# Patient Record
Sex: Female | Born: 1950 | Race: White | Hispanic: No | Marital: Married | Smoking: Never smoker
Health system: Southern US, Community
[De-identification: ages and names within clinical notes are randomized; demographics above are authoritative.]

## PROBLEM LIST (undated history)

## (undated) DIAGNOSIS — I4891 Unspecified atrial fibrillation: Secondary | ICD-10-CM

## (undated) DIAGNOSIS — M810 Age-related osteoporosis without current pathological fracture: Secondary | ICD-10-CM

## (undated) DIAGNOSIS — K222 Esophageal obstruction: Secondary | ICD-10-CM

## (undated) DIAGNOSIS — E119 Type 2 diabetes mellitus without complications: Secondary | ICD-10-CM

## (undated) DIAGNOSIS — G4730 Sleep apnea, unspecified: Secondary | ICD-10-CM

## (undated) DIAGNOSIS — Z952 Presence of prosthetic heart valve: Secondary | ICD-10-CM

## (undated) DIAGNOSIS — N183 Chronic kidney disease, stage 3 unspecified: Secondary | ICD-10-CM

## (undated) DIAGNOSIS — I313 Pericardial effusion (noninflammatory): Secondary | ICD-10-CM

## (undated) DIAGNOSIS — I350 Nonrheumatic aortic (valve) stenosis: Secondary | ICD-10-CM

## (undated) DIAGNOSIS — Z87442 Personal history of urinary calculi: Secondary | ICD-10-CM

## (undated) DIAGNOSIS — K219 Gastro-esophageal reflux disease without esophagitis: Secondary | ICD-10-CM

## (undated) DIAGNOSIS — I509 Heart failure, unspecified: Secondary | ICD-10-CM

## (undated) DIAGNOSIS — I10 Essential (primary) hypertension: Secondary | ICD-10-CM

## (undated) DIAGNOSIS — E785 Hyperlipidemia, unspecified: Secondary | ICD-10-CM

## (undated) DIAGNOSIS — L8990 Pressure ulcer of unspecified site, unspecified stage: Secondary | ICD-10-CM

## (undated) DIAGNOSIS — K449 Diaphragmatic hernia without obstruction or gangrene: Secondary | ICD-10-CM

## (undated) DIAGNOSIS — D490 Neoplasm of unspecified behavior of digestive system: Secondary | ICD-10-CM

## (undated) DIAGNOSIS — G459 Transient cerebral ischemic attack, unspecified: Secondary | ICD-10-CM

## (undated) DIAGNOSIS — K589 Irritable bowel syndrome without diarrhea: Secondary | ICD-10-CM

## (undated) DIAGNOSIS — T884XXA Failed or difficult intubation, initial encounter: Secondary | ICD-10-CM

## (undated) DIAGNOSIS — IMO0002 Reserved for concepts with insufficient information to code with codable children: Secondary | ICD-10-CM

## (undated) DIAGNOSIS — R413 Other amnesia: Secondary | ICD-10-CM

## (undated) DIAGNOSIS — I3139 Other pericardial effusion (noninflammatory): Secondary | ICD-10-CM

## (undated) DIAGNOSIS — I2510 Atherosclerotic heart disease of native coronary artery without angina pectoris: Secondary | ICD-10-CM

## (undated) HISTORY — DX: Gastro-esophageal reflux disease without esophagitis: K21.9

## (undated) HISTORY — DX: Age-related osteoporosis without current pathological fracture: M81.0

## (undated) HISTORY — PX: ESOPHAGOGASTRODUODENOSCOPY (EGD) WITH ESOPHAGEAL DILATION: SHX5812

## (undated) HISTORY — DX: Essential (primary) hypertension: I10

## (undated) HISTORY — DX: Other amnesia: R41.3

## (undated) HISTORY — DX: Esophageal obstruction: K22.2

## (undated) HISTORY — DX: Reserved for concepts with insufficient information to code with codable children: IMO0002

## (undated) HISTORY — DX: Pericardial effusion (noninflammatory): I31.3

## (undated) HISTORY — DX: Heart failure, unspecified: I50.9

## (undated) HISTORY — DX: Other pericardial effusion (noninflammatory): I31.39

## (undated) HISTORY — DX: Transient cerebral ischemic attack, unspecified: G45.9

## (undated) HISTORY — DX: Irritable bowel syndrome, unspecified: K58.9

## (undated) HISTORY — DX: Hyperlipidemia, unspecified: E78.5

## (undated) HISTORY — DX: Neoplasm of unspecified behavior of digestive system: D49.0

## (undated) HISTORY — DX: Sleep apnea, unspecified: G47.30

## (undated) HISTORY — PX: TUBAL LIGATION: SHX77

## (undated) HISTORY — DX: Atherosclerotic heart disease of native coronary artery without angina pectoris: I25.10

## (undated) HISTORY — DX: Morbid (severe) obesity due to excess calories: E66.01

## (undated) HISTORY — DX: Diaphragmatic hernia without obstruction or gangrene: K44.9

---
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
## 1980-08-15 HISTORY — PX: CHOLECYSTECTOMY OPEN: SUR202

## 1998-04-21 ENCOUNTER — Other Ambulatory Visit: Admission: RE | Admit: 1998-04-21 | Discharge: 1998-04-21 | Payer: Self-pay | Admitting: Obstetrics and Gynecology

## 1999-05-05 ENCOUNTER — Other Ambulatory Visit: Admission: RE | Admit: 1999-05-05 | Discharge: 1999-05-05 | Payer: Self-pay | Admitting: Obstetrics and Gynecology

## 2000-06-21 ENCOUNTER — Other Ambulatory Visit: Admission: RE | Admit: 2000-06-21 | Discharge: 2000-06-21 | Payer: Self-pay | Admitting: Obstetrics and Gynecology

## 2001-06-15 ENCOUNTER — Encounter: Payer: Self-pay | Admitting: Obstetrics and Gynecology

## 2001-06-15 ENCOUNTER — Encounter: Admission: RE | Admit: 2001-06-15 | Discharge: 2001-06-15 | Payer: Self-pay | Admitting: Obstetrics and Gynecology

## 2001-07-02 ENCOUNTER — Other Ambulatory Visit: Admission: RE | Admit: 2001-07-02 | Discharge: 2001-07-02 | Payer: Self-pay | Admitting: Obstetrics and Gynecology

## 2001-08-15 HISTORY — PX: CATARACT EXTRACTION W/ INTRAOCULAR LENS  IMPLANT, BILATERAL: SHX1307

## 2002-02-18 ENCOUNTER — Encounter: Payer: Self-pay | Admitting: Ophthalmology

## 2002-02-19 ENCOUNTER — Ambulatory Visit (HOSPITAL_COMMUNITY): Admission: RE | Admit: 2002-02-19 | Discharge: 2002-02-19 | Payer: Self-pay | Admitting: Ophthalmology

## 2002-06-17 ENCOUNTER — Encounter: Payer: Self-pay | Admitting: Obstetrics and Gynecology

## 2002-06-17 ENCOUNTER — Encounter: Admission: RE | Admit: 2002-06-17 | Discharge: 2002-06-17 | Payer: Self-pay | Admitting: Obstetrics and Gynecology

## 2002-07-09 ENCOUNTER — Other Ambulatory Visit: Admission: RE | Admit: 2002-07-09 | Discharge: 2002-07-09 | Payer: Self-pay | Admitting: Obstetrics & Gynecology

## 2002-09-03 ENCOUNTER — Ambulatory Visit (HOSPITAL_COMMUNITY): Admission: RE | Admit: 2002-09-03 | Discharge: 2002-09-03 | Payer: Self-pay | Admitting: Ophthalmology

## 2003-03-31 ENCOUNTER — Encounter: Admission: RE | Admit: 2003-03-31 | Discharge: 2003-06-29 | Payer: Self-pay | Admitting: Family Medicine

## 2003-07-04 ENCOUNTER — Encounter: Admission: RE | Admit: 2003-07-04 | Discharge: 2003-07-04 | Payer: Self-pay | Admitting: Obstetrics and Gynecology

## 2003-07-31 ENCOUNTER — Other Ambulatory Visit: Admission: RE | Admit: 2003-07-31 | Discharge: 2003-07-31 | Payer: Self-pay | Admitting: Obstetrics and Gynecology

## 2004-04-13 ENCOUNTER — Ambulatory Visit (HOSPITAL_COMMUNITY): Admission: RE | Admit: 2004-04-13 | Discharge: 2004-04-13 | Payer: Self-pay | Admitting: Gastroenterology

## 2004-05-15 LAB — HM COLONOSCOPY

## 2004-07-05 ENCOUNTER — Ambulatory Visit (HOSPITAL_COMMUNITY): Admission: RE | Admit: 2004-07-05 | Discharge: 2004-07-05 | Payer: Self-pay | Admitting: Obstetrics and Gynecology

## 2004-07-14 ENCOUNTER — Ambulatory Visit (HOSPITAL_COMMUNITY): Admission: RE | Admit: 2004-07-14 | Discharge: 2004-07-14 | Payer: Self-pay | Admitting: Family Medicine

## 2004-07-22 ENCOUNTER — Ambulatory Visit: Payer: Self-pay

## 2004-07-28 ENCOUNTER — Ambulatory Visit: Payer: Self-pay | Admitting: Cardiology

## 2004-08-04 ENCOUNTER — Other Ambulatory Visit: Admission: RE | Admit: 2004-08-04 | Discharge: 2004-08-04 | Payer: Self-pay | Admitting: Obstetrics and Gynecology

## 2004-08-15 HISTORY — PX: CARDIAC CATHETERIZATION: SHX172

## 2004-09-29 ENCOUNTER — Ambulatory Visit (HOSPITAL_COMMUNITY): Admission: RE | Admit: 2004-09-29 | Discharge: 2004-09-29 | Payer: Self-pay | Admitting: Family Medicine

## 2004-10-19 ENCOUNTER — Ambulatory Visit: Payer: Self-pay

## 2004-10-19 ENCOUNTER — Ambulatory Visit: Payer: Self-pay | Admitting: Cardiology

## 2004-10-22 ENCOUNTER — Ambulatory Visit: Payer: Self-pay | Admitting: Cardiology

## 2004-11-10 ENCOUNTER — Ambulatory Visit: Payer: Self-pay | Admitting: Cardiology

## 2004-11-23 ENCOUNTER — Ambulatory Visit: Payer: Self-pay

## 2004-12-02 ENCOUNTER — Ambulatory Visit: Payer: Self-pay | Admitting: Cardiology

## 2005-01-04 ENCOUNTER — Ambulatory Visit: Payer: Self-pay | Admitting: Cardiology

## 2005-01-06 ENCOUNTER — Inpatient Hospital Stay (HOSPITAL_BASED_OUTPATIENT_CLINIC_OR_DEPARTMENT_OTHER): Admission: RE | Admit: 2005-01-06 | Discharge: 2005-01-06 | Payer: Self-pay | Admitting: Cardiology

## 2005-01-13 ENCOUNTER — Ambulatory Visit: Payer: Self-pay | Admitting: Cardiology

## 2005-01-18 ENCOUNTER — Ambulatory Visit: Payer: Self-pay

## 2005-02-24 ENCOUNTER — Ambulatory Visit: Payer: Self-pay | Admitting: Cardiology

## 2005-04-26 ENCOUNTER — Ambulatory Visit: Payer: Self-pay

## 2005-06-02 ENCOUNTER — Ambulatory Visit: Payer: Self-pay | Admitting: Cardiology

## 2005-08-23 ENCOUNTER — Other Ambulatory Visit: Admission: RE | Admit: 2005-08-23 | Discharge: 2005-08-23 | Payer: Self-pay | Admitting: Obstetrics and Gynecology

## 2005-08-24 ENCOUNTER — Ambulatory Visit: Payer: Self-pay | Admitting: Cardiology

## 2005-10-21 ENCOUNTER — Ambulatory Visit: Payer: Self-pay

## 2005-10-21 ENCOUNTER — Encounter: Payer: Self-pay | Admitting: Internal Medicine

## 2006-11-13 ENCOUNTER — Ambulatory Visit: Payer: Self-pay

## 2007-06-18 ENCOUNTER — Ambulatory Visit: Payer: Self-pay | Admitting: Cardiology

## 2007-09-08 ENCOUNTER — Ambulatory Visit (HOSPITAL_COMMUNITY): Admission: RE | Admit: 2007-09-08 | Discharge: 2007-09-08 | Payer: Self-pay | Admitting: Physician Assistant

## 2007-10-24 ENCOUNTER — Ambulatory Visit: Payer: Self-pay

## 2007-10-24 ENCOUNTER — Encounter: Payer: Self-pay | Admitting: Cardiology

## 2007-12-26 ENCOUNTER — Ambulatory Visit: Payer: Self-pay | Admitting: Cardiology

## 2007-12-26 LAB — CONVERTED CEMR LAB
BUN: 23 mg/dL (ref 6–23)
CO2: 27 meq/L (ref 19–32)
Calcium: 8.7 mg/dL (ref 8.4–10.5)
Chloride: 109 meq/L (ref 96–112)
Creatinine, Ser: 1.1 mg/dL (ref 0.4–1.2)
GFR calc Af Amer: 66 mL/min
GFR calc non Af Amer: 55 mL/min
Glucose, Bld: 133 mg/dL — ABNORMAL HIGH (ref 70–99)
Potassium: 4.8 meq/L (ref 3.5–5.1)
Sodium: 142 meq/L (ref 135–145)

## 2008-12-24 ENCOUNTER — Encounter: Payer: Self-pay | Admitting: Cardiology

## 2008-12-24 ENCOUNTER — Ambulatory Visit: Payer: Self-pay

## 2008-12-25 ENCOUNTER — Encounter: Payer: Self-pay | Admitting: Cardiology

## 2008-12-26 DIAGNOSIS — E119 Type 2 diabetes mellitus without complications: Secondary | ICD-10-CM | POA: Insufficient documentation

## 2008-12-26 DIAGNOSIS — K219 Gastro-esophageal reflux disease without esophagitis: Secondary | ICD-10-CM | POA: Insufficient documentation

## 2008-12-26 DIAGNOSIS — M1990 Unspecified osteoarthritis, unspecified site: Secondary | ICD-10-CM | POA: Insufficient documentation

## 2008-12-26 DIAGNOSIS — I2510 Atherosclerotic heart disease of native coronary artery without angina pectoris: Secondary | ICD-10-CM | POA: Insufficient documentation

## 2008-12-26 DIAGNOSIS — I3139 Other pericardial effusion (noninflammatory): Secondary | ICD-10-CM | POA: Insufficient documentation

## 2008-12-26 DIAGNOSIS — E1169 Type 2 diabetes mellitus with other specified complication: Secondary | ICD-10-CM | POA: Insufficient documentation

## 2008-12-26 DIAGNOSIS — E785 Hyperlipidemia, unspecified: Secondary | ICD-10-CM | POA: Insufficient documentation

## 2008-12-26 DIAGNOSIS — I313 Pericardial effusion (noninflammatory): Secondary | ICD-10-CM | POA: Insufficient documentation

## 2008-12-26 DIAGNOSIS — I10 Essential (primary) hypertension: Secondary | ICD-10-CM | POA: Insufficient documentation

## 2008-12-30 ENCOUNTER — Ambulatory Visit: Payer: Self-pay | Admitting: Cardiology

## 2008-12-30 DIAGNOSIS — R600 Localized edema: Secondary | ICD-10-CM | POA: Insufficient documentation

## 2009-01-05 ENCOUNTER — Ambulatory Visit: Payer: Self-pay

## 2009-01-05 ENCOUNTER — Encounter: Payer: Self-pay | Admitting: Cardiology

## 2009-02-23 ENCOUNTER — Encounter: Payer: Self-pay | Admitting: Cardiology

## 2009-02-23 ENCOUNTER — Ambulatory Visit: Payer: Self-pay

## 2009-02-27 ENCOUNTER — Telehealth (INDEPENDENT_AMBULATORY_CARE_PROVIDER_SITE_OTHER): Payer: Self-pay | Admitting: *Deleted

## 2009-03-02 ENCOUNTER — Ambulatory Visit: Payer: Self-pay | Admitting: Vascular Surgery

## 2009-08-15 HISTORY — PX: CARPAL TUNNEL RELEASE: SHX101

## 2009-10-13 ENCOUNTER — Ambulatory Visit: Payer: Self-pay | Admitting: Vascular Surgery

## 2009-11-30 ENCOUNTER — Ambulatory Visit: Payer: Self-pay | Admitting: Vascular Surgery

## 2009-12-08 ENCOUNTER — Ambulatory Visit: Payer: Self-pay | Admitting: Vascular Surgery

## 2009-12-13 LAB — HM PAP SMEAR

## 2010-02-19 ENCOUNTER — Encounter: Payer: Self-pay | Admitting: Internal Medicine

## 2010-02-22 ENCOUNTER — Telehealth: Payer: Self-pay | Admitting: Cardiology

## 2010-02-22 ENCOUNTER — Ambulatory Visit: Payer: Self-pay | Admitting: Internal Medicine

## 2010-02-23 ENCOUNTER — Ambulatory Visit
Admission: RE | Admit: 2010-02-23 | Discharge: 2010-02-23 | Payer: Self-pay | Source: Home / Self Care | Admitting: Orthopedic Surgery

## 2010-04-11 ENCOUNTER — Observation Stay (HOSPITAL_COMMUNITY): Admission: EM | Admit: 2010-04-11 | Discharge: 2010-04-13 | Payer: Self-pay | Admitting: Emergency Medicine

## 2010-04-11 ENCOUNTER — Encounter (INDEPENDENT_AMBULATORY_CARE_PROVIDER_SITE_OTHER): Payer: Self-pay | Admitting: Internal Medicine

## 2010-04-11 ENCOUNTER — Ambulatory Visit: Payer: Self-pay | Admitting: Vascular Surgery

## 2010-07-28 ENCOUNTER — Ambulatory Visit: Payer: Self-pay | Admitting: Cardiology

## 2010-07-28 ENCOUNTER — Encounter: Payer: Self-pay | Admitting: Cardiology

## 2010-07-28 DIAGNOSIS — R9431 Abnormal electrocardiogram [ECG] [EKG]: Secondary | ICD-10-CM | POA: Insufficient documentation

## 2010-08-04 ENCOUNTER — Ambulatory Visit: Payer: Self-pay

## 2010-08-04 ENCOUNTER — Ambulatory Visit: Payer: Self-pay | Admitting: Cardiology

## 2010-08-04 DIAGNOSIS — R9439 Abnormal result of other cardiovascular function study: Secondary | ICD-10-CM | POA: Insufficient documentation

## 2010-08-10 ENCOUNTER — Telehealth (INDEPENDENT_AMBULATORY_CARE_PROVIDER_SITE_OTHER): Payer: Self-pay | Admitting: *Deleted

## 2010-08-11 ENCOUNTER — Encounter: Payer: Self-pay | Admitting: Cardiology

## 2010-08-11 ENCOUNTER — Ambulatory Visit: Payer: Self-pay

## 2010-08-11 ENCOUNTER — Encounter: Payer: Self-pay | Admitting: Cardiovascular Disease

## 2010-08-11 ENCOUNTER — Encounter (HOSPITAL_COMMUNITY)
Admission: RE | Admit: 2010-08-11 | Discharge: 2010-09-14 | Payer: Self-pay | Source: Home / Self Care | Attending: Cardiology | Admitting: Cardiology

## 2010-08-12 ENCOUNTER — Ambulatory Visit: Admission: RE | Admit: 2010-08-12 | Discharge: 2010-08-12 | Payer: Self-pay | Source: Home / Self Care

## 2010-08-13 ENCOUNTER — Encounter: Payer: Self-pay | Admitting: Cardiology

## 2010-08-18 ENCOUNTER — Ambulatory Visit
Admission: RE | Admit: 2010-08-18 | Discharge: 2010-08-18 | Payer: Self-pay | Source: Home / Self Care | Attending: Cardiology | Admitting: Cardiology

## 2010-08-18 DIAGNOSIS — R0602 Shortness of breath: Secondary | ICD-10-CM | POA: Insufficient documentation

## 2010-09-05 ENCOUNTER — Encounter: Payer: Self-pay | Admitting: Family Medicine

## 2010-09-14 NOTE — Assessment & Plan Note (Signed)
Summary: APPT @ 3:45/per check out/sf  Medications Added JANUMET 50-1000 MG TABS (SITAGLIPTIN-METFORMIN HCL) two times a day GLIPIZIDE 5 MG TABS (GLIPIZIDE) two times a day NOVOLOG MIX 70/30 70-30 % SUSP (INSULIN ASPART PROT & ASPART) two times a day METOPROLOL TARTRATE 50 MG TABS (METOPROLOL TARTRATE) two times a day QUINAPRIL HCL 40 MG TABS (QUINAPRIL HCL) daily DIOVAN HCT 320-12.5 MG TABS (VALSARTAN-HYDROCHLOROTHIAZIDE) daily NEXIUM 40 MG CPDR (ESOMEPRAZOLE MAGNESIUM) daily ASPIRIN 325 MG  TABS (ASPIRIN) daily      Allergies Added: ! PENICILLIN  Visit Type:  Follow-up Primary Provider:  Morrie Sheldon, MD  CC:  Pericardial Effusion.  History of Present Illness: The patient presents for followup of idiopathic pericardial effusion. She had a followup echo earlier this year which demonstrated no significant effusion. Since I last saw her she has had no new cardiovascular complaints. She denies any new shortness of breath, PND or orthopnea. She does have lower extremity swelling that is worse on the right than the left. This has been somewhat chronic but she has noticed that her right leg now has a more tense calf swelling than previous.  Allergies (verified): 1)  ! Penicillin  Past History:  Past Medical History:    Reviewed history from 12/26/2008 and no changes required:     1. Nonobstructive coronary artery disease.      2. Well-preserved ejection fraction.      3. Pericardial effusion of unclear etiology (small).      4. Dyslipidemia.      5. Diabetes mellitus.      6. Hypertension.      7. Gastroesophageal reflux disease.      8. Osteoarthritis.   Past Surgical History:    Reviewed history from 12/26/2008 and no changes required:    1. Cholecystectomy.    2. Tonsillectomy    3. Eye surgery  Review of Systems       She reports falling asleep easily but denies snoring or apneic episodes. All other review of systems negative.  Vital Signs:  Patient profile:   60 year  old female Height:      68 inches Weight:      240 pounds BMI:     36.62 Pulse rate:   96 / minute Resp:     18 per minute BP sitting:   155 / 86  (right arm)  Vitals Entered By: Levora Angel, CNA (Dec 30, 2008 3:51 PM)  Physical Exam  General:  Well developed, well nourished, in no acute distress. Head:  normocephalic and atraumatic Eyes:  PERRLA/EOM intact; conjunctiva and lids normal. Mouth:  Teeth, gums and palate normal. Oral mucosa normal. Neck:  Neck supple, no JVD. No masses, thyromegaly or abnormal cervical nodes. Chest Wall:  no deformities or breast masses noted Lungs:  Clear bilaterally to auscultation and percussion. Abdomen:  Bowel sounds positive; abdomen soft and non-tender without masses, organomegaly, or hernias noted. No hepatosplenomegaly, morbidly obese Msk:  Back normal, normal gait. Muscle strength and tone normal. Pulses:  pulses normal in all 4 extremities Neurologic:  Alert and oriented x 3. Skin:  Intact without lesions or rashes. Cervical Nodes:  no significant adenopathy Psych:  Normal affect.   Detailed Cardiovascular Exam  Neck    Carotids: Carotids full and equal bilaterally without bruits.      Neck Veins: Normal, no JVD, difficult exam secondary to the size of her neck.  Heart    Inspection: no deformities or lifts noted.  Palpation: normal PMI with no thrills palpable.      Auscultation: regular rate and rhythm, S1, S2 without murmurs, rubs, gallops, or clicks.    Vascular    Abdominal Aorta: no palpable masses, pulsations, or audible bruits.      Femoral Pulses: normal femoral pulses bilaterally.      Pedal Pulses: normal pedal pulses bilaterally.      Radial Pulses: normal radial pulses bilaterally.      Peripheral Circulation: right greater than left lower extremity edema with right chronic venous stasis changes.   EKG  Procedure date:  12/30/2008  Findings:      Normal sinus rhythm, rate 96, no acute ST-T wave changes.   Impression & Recommendations:  Problem # 1:  SWELLING OF LIMB (ICD-729.81) I will order a lower extremity venous Doppler to exclude DVT. Orders: Venous Duplex Lower Extremity (Venous Duplex Lower)  Problem # 2:  PERICARDIAL EFFUSION (ICD-423.9) The patient has no evidence of effusion. No further cardiovascular testing is suggested. Orders: EKG w/ Interpretation (93000)  Patient Instructions: 1)  Your physician recommends that you schedule a follow-up appointment in:  needed 2)  Your physician recommends that you continue on your current medications as directed. Please refer to the Current Medication list given to you today. 3)  Your physician has requested that you have a lower or upper extremity venous duplex.  This test is an ultrasound of the veins in the legs or arms.  It looks at venous blood flow that carries blood from the heart to the legs or arms.  Allow one hour for a Lower Venous exam.  Allow thirty minutes for an Upper Venous exam. There are no restrictions or special instructions.

## 2010-09-14 NOTE — Miscellaneous (Signed)
Summary: Orders Update  Clinical Lists Changes  Orders: Added new Test order of Venous Duplex Lower Extremity (Venous Duplex Lower) - Signed 

## 2010-09-14 NOTE — Progress Notes (Signed)
Summary: surgical clearance   Phone Note From Other Clinic   Caller: nurse lorraine Summary of Call: Per Edwena Felty. pt to have carpal tunnel surgery tomorrow morning. pt needs clearance. Ekg was faxed on Friday to [facility] & [facility]. Havn't heard anything about clearance as of yet. Please call and let nurse know if she is ok or if they need to rs. ofc [phone] or [phone] and pager Lorriane Rothrock.  Initial call taken by: Lorraine Lax,  February 22, 2010 9:29 AM  Follow-up for Phone Call        02/22/10--10am--[facility]--day surgery calling for surg clearance for carpal tunnel surgery for 02/23/10--advised pt has not seen dr Anja Neuzil for over 1 year and EKG( done at [facility]  shows PAC's,st abnormality,and junctional escape complexes--advised lorraine rothrock at day surgery that dr Caidynce Muzyka not in office today and probably would want to see pt before surgery--lorraine agrees and will notify pt and have pt call our office to make appoint.--surgery will be postponed until then--nt Follow-up by: Leodis Sias, RN,  February 22, 2010 10:10 AM     Appended Document: surgical clearance pt saw Dr Harrington Challenger in the office 02/22/2010 and surgical clearance was given, please see her note.

## 2010-09-14 NOTE — Assessment & Plan Note (Signed)
Summary: pt having surgery 7/13/lg  Medications Added METFORMIN HCL 1000 MG TABS (METFORMIN HCL) 1 tab  two times a day ONGLYZA 5 MG TABS (SAXAGLIPTIN HCL) 1 tab once daily      Allergies Added:   Visit Type:  Pre-op Evaluation Primary Provider:  Morrie Sheldon, MD  CC:  pt has a cough from a cold. Marland Kitchen  History of Present Illness: Patient is a 60 year old with a history of mild CAD by cath in 2006.  She was last seen by Vita Barley last year.   sHe also has a hitory of diabetes, obesity, dyslipidemia and hypertension. She is being evaluated for carpal tunnel surgery that will be done under regional anesthetic. She denies chest pains.  She is not that active.  She is up walking at work during the day but at a slower pace.  At home does not do much house work   Does not walk in an organized fashion after work. She said she will get short of breath if she pushes herself, but overall does not notice any problems with her breathing.  Current Medications (verified): 1)  Novolog Mix 70/30 70-30 % Susp (Insulin Aspart Prot & Aspart) .... Two Times A Day 2)  Metoprolol Tartrate 50 Mg Tabs (Metoprolol Tartrate) .... Two Times A Day 3)  Quinapril Hcl 40 Mg Tabs (Quinapril Hcl) .... Daily 4)  Diovan Hct 320-12.5 Mg Tabs (Valsartan-Hydrochlorothiazide) .... Daily 5)  Nexium 40 Mg Cpdr (Esomeprazole Magnesium) .... Daily 6)  Aspirin 325 Mg  Tabs (Aspirin) .... Daily 7)  Metformin Hcl 1000 Mg Tabs (Metformin Hcl) .Marland Kitchen.. 1 Tab  Two Times A Day 8)  Onglyza 5 Mg Tabs (Saxagliptin Hcl) .Marland Kitchen.. 1 Tab Once Daily  Allergies (verified): 1)  ! Penicillin  Past History:  Past medical, surgical, family and social histories (including risk factors) reviewed, and no changes noted (except as noted below).  Past Medical History: Reviewed history from 12/26/2008 and no changes required.  1. Nonobstructive coronary artery disease.   2. Well-preserved ejection fraction.   3. Pericardial effusion of unclear etiology  (small).   4. Dyslipidemia.   5. Diabetes mellitus.   6. Hypertension.   7. Gastroesophageal reflux disease.   8. Osteoarthritis.   Past Surgical History: Reviewed history from 12/26/2008 and no changes required. 1. Cholecystectomy. 2. Tonsillectomy 3. Eye surgery  Family History: Reviewed history from 12/26/2008 and no changes required. Mother: Deceased heart failure Father: bypass Family History of Diabetes:  Family History of Hypertension:   Social History: Reviewed history from 12/26/2008 and no changes required. Full Time Married  Tobacco Use - No.  Alcohol Use - no  Review of Systems       All systems reviewed.  Neg to the above problems.  Is recovering from a URI.  Vital Signs:  Patient profile:   60 year old female Height:      68 inches Weight:      237 pounds BMI:     36.17 Pulse rate:   98 / minute BP sitting:   142 / 81  (left arm) Cuff size:   large  Vitals Entered By: Lubertha Basque, CNA (February 22, 2010 3:37 PM)  Physical Exam  Additional Exam:  Obese 60 year old in NAD HEENT:  Normocephalic, atraumatic. EOMI, PERRLA.  Neck: JVP is normal. No thyromegaly. No bruits.  Lungs: clear to auscultation. No rales no wheezes.  Heart: Regular rate and rhythm. Normal S1, S2. No S3.   No significant murmurs.  PMI not displaced.  Abdomen:  Supple, nontender. Normal bowel sounds. No masses. No hepatomegaly.  Extremities:   Good distal pulses throughout. No lower extremity edema.  Musculoskeletal :moving all extremities.  Neuro:   alert and oriented x3.    EKG  Procedure date:  02/22/2010  Findings:      NSR.  98 bpm.  Occasional PAC.  T wave inversion I, AVL  Impression & Recommendations:  Problem # 1:  CAD (ICD-414.00) Patient with very mild CAD by cath in 2006.  I am not convinced she has any progressive symptoms.  She is not that active but I did walk with her in clinic and she did not develop any signif SOB. Her ECG shows occasional ectopy that is  not significant.  She also has lateral ST T wave changes.  These are a little more probminent but not new.  They were present on previous ECG. Overalll, I feel she is at a low, acceptable risk for the planned surgery and that it is ok to proceed. Will have her f/u with Dr. Percival Spanish.  Problem # 2:  DYSLIPIDEMIA (ICD-272.4) She is now on Crestor 5 mg per Dr Tawanna Sat office.  Chol will need to be followed.  Problem # 3:  DM (ICD-250.00) Followed by Dr. Laurance Flatten.  Encouraged her to exercise and watch diet to lose wt.  Problem # 4:  HYPERTENSION (ICD-401.9) COntinue meds.  Other Orders: EKG w/ Interpretation (93000)

## 2010-09-16 NOTE — Miscellaneous (Signed)
  Clinical Lists Changes  Observations: Added new observation of ECHOINTERP: Findings  Normal nuclear study      Overall Impression   Exercise Capacity: Lexiscan with low level exercise BP Response: Normal blood pressure response. Clinical Symptoms: Nausea ECG Impression: No significant ST segment change suggestive of ischemia. Overall Impression: Normal stress nuclear study. (08/11/2010 12:12)      Echocardiogram  Procedure date:  08/11/2010  Findings:      Findings  Normal nuclear study      Overall Impression   Exercise Capacity: Lexiscan with low level exercise BP Response: Normal blood pressure response. Clinical Symptoms: Nausea ECG Impression: No significant ST segment change suggestive of ischemia. Overall Impression: Normal stress nuclear study.

## 2010-09-16 NOTE — Assessment & Plan Note (Signed)
Summary: Cardiology Nuclear Testing  Nuclear Med Background Indications for Stress Test: Evaluation for Ischemia, Abnormal EKG   History: Echo, GXT, Heart Catheterization, Myocardial Perfusion Study  History Comments: 4/06 [id number] ischemia, EF=72%; 5/06 Cath:n/o CAD, EF=65%; 5/10 Echo:normal LVF; 08/04/10 [id number]  Symptoms: Chest Pain, Chest Pain with Exertion, Diaphoresis, DOE, Fatigue, Fatigue with Exertion, Near Syncope, Palpitations, SOB    Nuclear Pre-Procedure Cardiac Risk Factors: Family History - CAD, IDDM Type 2, Lipids, Obesity Caffeine/Decaff Intake: None NPO After: 7:30 AM Lungs: Clear IV 0.9% NS with Angio Cath: 22g     IV Site: R Antecubital IV Started by: Irven Baltimore, RN Chest Size (in) 44     Cup Size C     Height (in): 67 Weight (lb): 237 BMI: 37.25  Nuclear Med Study 1 or 2 day study:  2 day     Stress Test Type:  Treadmill/Lexiscan Reading MD:  Kirk Ruths, MD     Referring MD:  J.Hochrein Resting Radionuclide:  Technetium 53m Tetrofosmin     Resting Radionuclide Dose:  33 mCi  Stress Radionuclide:  Technetium 44m Tetrofosmin     Stress Radionuclide Dose:  33 mCi   Stress Protocol      Max HR:  123456 bpm Max Systolic BP: Q000111Q mm HgRate Pressure Product:  [id number]  Lexiscan: 0.4 mg   Stress Test Technologist:  Irven Baltimore,  RN     Nuclear Technologist:  Charlton Amor, CNMT  Rest Procedure  Myocardial perfusion imaging was performed at rest 45 minutes following the intravenous administration of Technetium 88m Tetrofosmin.  Stress Procedure  The patient received IV Lexiscan 0.4 mg over 15-seconds with concurrent low level exercise and then Technetium 44m Tetrofosmin was injected at 30-seconds while the patient continued walking one more minute.  There were nonspecific ST-Tchanges with Lexiscan.  Quantitative spect images were obtained after a 45 minute delay.  QPS Raw Data Images:  Normal; no motion artifact; normal heart/lung  ratio. Stress Images:  Normal homogeneous uptake in all areas of the myocardium. Rest Images:  Normal homogeneous uptake in all areas of the myocardium. Subtraction (SDS):  Normal Transient Ischemic Dilatation:  n  (Normal <1.22)  Lung/Heart Ratio:  0.32  (Normal <0.45)  Quantitative Gated Spect Images QGS cine images:  non-gated study  Findings Normal nuclear study      Overall Impression  Exercise Capacity: Lexiscan with low level exercise BP Response: Normal blood pressure response. Clinical Symptoms: Nausea ECG Impression: No significant ST segment change suggestive of ischemia. Overall Impression: Normal stress nuclear study.  Appended Document: Cardiology Nuclear Testing Negative study for ischemia.  Appended Document: Cardiology Nuclear Testing pt aware of results

## 2010-09-16 NOTE — Assessment & Plan Note (Signed)
Summary: [facility]  Medications Added TRIBENZOR 40-5-12.5 MG TABS (OLMESARTAN-AMLODIPINE-HCTZ) 1 by mouth daily LANTUS 100 UNIT/ML SOLN (INSULIN GLARGINE) as directed CRESTOR 5 MG TABS (ROSUVASTATIN CALCIUM) 1 by mouth daily ASPIRIN 325 MG  TABS (ASPIRIN) 1 by mouth daily      Allergies Added:   Visit Type:  Follow-up Primary Provider:  Morrie Sheldon, MD  CC:  CAD and Abnormal.  History of Present Illness: The patient presents for followup of an abnormal EKG. She was recently noted to have T-wave inversions in the lateral leads new compared with previous.  She was approved for carpal tunnel surgery and she had no problems with this.  She is somewhat limited by back pain.  However, she does not chest pressure, neck or arm discomfort with low level of exercise. She does have some shortness of breath. She sleeps with her head elevated but this has been chronic. She has no new PND or orthopnea. She has had no new weight gain or edema but conversely she has had no weight loss either.  Current Medications (verified): 1)  Novolog Mix 70/30 70-30 % Susp (Insulin Aspart Prot & Aspart) .... Two Times A Day 2)  Tribenzor 40-5-12.5 Mg Tabs (Olmesartan-Amlodipine-Hctz) .Marland Kitchen.. 1 By Mouth Daily 3)  Nexium 40 Mg Cpdr (Esomeprazole Magnesium) .... Daily 4)  Aspirin 325 Mg  Tabs (Aspirin) .... Daily 5)  Metformin Hcl 1000 Mg Tabs (Metformin Hcl) .Marland Kitchen.. 1 Tab  Two Times A Day 6)  Lantus 100 Unit/ml Soln (Insulin Glargine) .... As Directed 7)  Crestor 5 Mg Tabs (Rosuvastatin Calcium) .Marland Kitchen.. 1 By Mouth Daily 8)  Aspirin 325 Mg  Tabs (Aspirin) .Marland Kitchen.. 1 By Mouth Daily  Allergies (verified): 1)  ! Penicillin  Past History:  Past Medical History: Reviewed history from 12/26/2008 and no changes required.  1. Nonobstructive coronary artery disease.   2. Well-preserved ejection fraction.   3. Pericardial effusion of unclear etiology (small).   4. Dyslipidemia.   5. Diabetes mellitus.   6. Hypertension.   7. Gastroesophageal reflux disease.   8. Osteoarthritis.   Past Surgical History: 1. Cholecystectomy. 2. Tonsillectomy 3. Eye surgery 4. Left carpal tunnel  Review of Systems       As stated in the HPI and negative for all other systems.   Vital Signs:  Patient profile:   60 year old female Height:      68 inches Weight:      237 pounds BMI:     36.17 Pulse rate:   100 / minute Resp:     16 per minute BP sitting:   138 / 82  (right arm)  Vitals Entered By: Levora Angel, CNA (July 28, 2010 2:10 PM)  Physical Exam  General:  Well developed, well nourished, in no acute distress. Head:  normocephalic and atraumatic Mouth:  Teeth, gums and palate normal. Oral mucosa normal. Neck:  Neck supple, no JVD. No masses, thyromegaly or abnormal cervical nodes. Chest Wall:  no deformities or breast masses noted Lungs:  Clear bilaterally to auscultation and percussion. Abdomen:  Bowel sounds positive; abdomen soft and non-tender without masses, organomegaly, or hernias noted. No hepatosplenomegaly, morbidly obese Msk:  Back normal, normal gait. Muscle strength and tone normal. Extremities:  No clubbing or cyanosis. Neurologic:  Alert and oriented x 3. Skin:  Intact without lesions or rashes. Cervical Nodes:  no significant adenopathy Axillary Nodes:  no significant adenopathy Inguinal Nodes:  no significant adenopathy Psych:  Normal affect.   Detailed Cardiovascular Exam  Neck    Carotids: Carotids full and equal bilaterally without bruits.      Neck Veins: Normal, no JVD, difficult exam secondary to the size of her neck.  Heart    Inspection: no deformities or lifts noted.      Palpation: normal PMI with no thrills palpable.      Auscultation: regular rate and rhythm, S1, S2 without murmurs, rubs, gallops, or clicks.    Vascular    Abdominal Aorta: no palpable masses, pulsations, or audible bruits.      Femoral Pulses: normal femoral pulses bilaterally.      Pedal  Pulses: pulses normal in all 4 extremities    Radial Pulses: normal radial pulses bilaterally.      Peripheral Circulation: right greater than left lower extremity edema with right chronic venous stasis changes.   Impression & Recommendations:  Problem # 1:  ELECTROCARDIOGRAM, ABNORMAL (ICD-794.31) I did review her EKG and agree that she has new T-wave inversions. With her risk factors exercise treadmill testing is indicated. She thinks she could try this but if her back is not allow she would need to be rescheduled for a pharmacologic perfusion study.  Problem # 2:  HYPERTENSION (ICD-401.9) Her blood pressure is controlled. She will continue the meds as listed.  Problem # 3:  PERICARDIAL EFFUSION (ICD-423.9) I would not suspect clinically significant fluid. No further evaluation is planned at this point.  Problem # 4:  DM (ICD-250.00) She reports that this has not been well controlled. He is followed by her primary provider.  Appended Document: orders and instructions    Clinical Lists Changes  Orders: Added new Referral order of Treadmill (Treadmill) - Signed Observations: Added new observation of PI CARDIO: Your physician has requested that you have an exercise tolerance test.  For further information please visit [email or web address].  Please also follow instruction sheet, as given. (07/28/2010 15:02)       Patient Instructions: 1)  Your physician has requested that you have an exercise tolerance test.  For further information please visit [email or web address].  Please also follow instruction sheet, as given.

## 2010-09-16 NOTE — Progress Notes (Signed)
Summary: Nuclear pre procedure  Phone Note Outgoing Call Call back at [facility] Phone [phone]   Call placed by: Valetta Fuller, [place],  August 10, 2010 5:32 PM Call placed to: Patient Summary of Call: Reviewed information on [employer] Information Sheet (see scanned document for further details).  Webb Silversmith spoke with patient.      Nuclear Med Background Indications for Stress Test: Evaluation for Ischemia, Abnormal EKG   History: Echo, GXT, Heart Catheterization, Myocardial Perfusion Study  History Comments: 4/06 [id number] ischemia, EF=72%; 5/06 Cath:n/o CAD, EF=65%; 5/10 Echo:normal LVF; 08/04/10 [id number]  Symptoms: SOB    Nuclear Pre-Procedure Cardiac Risk Factors: Family History - CAD, IDDM Type 2, Lipids, Obesity Height (in): 68

## 2010-09-16 NOTE — Assessment & Plan Note (Signed)
Summary: 2wk f/u myoview 08-11-10/sl  Medications Added METOPROLOL TARTRATE 50 MG TABS (METOPROLOL TARTRATE) 1 by mouth bid      Allergies Added:   Visit Type:  Follow-up Primary Provider:  Morrie Sheldon, MD  CC:  Dyspnea.  History of Present Illness: The patient recently presented for an exercise treadmill test to evaluate her dyspnea.  She had a very poor exercise tolerance and a hypertensive BP response.  She was subsequently referred for a stress perfusion study which demonstrated no evidence of ischemia or infarct. Her ejection fraction was preserved. Since that time she's had no new complaints she continues to have her dyspnea with exertion. She said no chest pressure, neck or arm discomfort.   Current Medications (verified): 1)  Novolog Mix 70/30 70-30 % Susp (Insulin Aspart Prot & Aspart) .... Two Times A Day 2)  Tribenzor 40-5-12.5 Mg Tabs (Olmesartan-Amlodipine-Hctz) .Marland Kitchen.. 1 By Mouth Daily 3)  Nexium 40 Mg Cpdr (Esomeprazole Magnesium) .... Daily 4)  Aspirin 325 Mg  Tabs (Aspirin) .... Daily 5)  Metformin Hcl 1000 Mg Tabs (Metformin Hcl) .Marland Kitchen.. 1 Tab  Two Times A Day 6)  Lantus 100 Unit/ml Soln (Insulin Glargine) .... As Directed 7)  Crestor 5 Mg Tabs (Rosuvastatin Calcium) .Marland Kitchen.. 1 By Mouth Daily 8)  Metoprolol Tartrate 50 Mg Tabs (Metoprolol Tartrate) .Marland Kitchen.. 1 By Mouth Bid  Allergies (verified): 1)  ! Penicillin  Past History:  Past Medical History: Reviewed history from 12/26/2008 and no changes required.  1. Nonobstructive coronary artery disease.   2. Well-preserved ejection fraction.   3. Pericardial effusion of unclear etiology (small).   4. Dyslipidemia.   5. Diabetes mellitus.   6. Hypertension.   7. Gastroesophageal reflux disease.   8. Osteoarthritis.   Past Surgical History: Reviewed history from 07/28/2010 and no changes required. 1. Cholecystectomy. 2. Tonsillectomy 3. Eye surgery 4. Left carpal tunnel  Review of Systems       As stated in the HPI and  negative for all other systems.   Vital Signs:  Patient profile:   60 year old female Height:      67 inches Weight:      247 pounds BMI:     38.83 Pulse rate:   96 / minute Resp:     16 per minute BP sitting:   153 / 82  (right arm)  Vitals Entered By: Levora Angel, CNA (August 18, 2010 4:17 PM)  Physical Exam  General:  Well developed, well nourished, in no acute distress. Head:  normocephalic and atraumatic Eyes:  PERRLA/EOM intact; conjunctiva and lids normal. Mouth:  Teeth, gums and palate normal. Oral mucosa normal. Neck:  Neck supple, no JVD. No masses, thyromegaly or abnormal cervical nodes. Chest Wall:  no deformities Lungs:  Clear bilaterally to auscultation and percussion. Heart:  Non-displaced PMI, chest non-tender; regular rate and rhythm, S1, S2 without murmurs, rubs or gallops. Carotid upstroke normal, no bruit. Normal abdominal aortic size, no bruits. Femorals normal pulses, no bruits. Pedals normal pulses. No edema, no varicosities. Abdomen:  Bowel sounds positive; abdomen soft and non-tender without masses, organomegaly, or hernias noted. No hepatosplenomegaly, morbidly obese Msk:  Back normal, normal gait. Muscle strength and tone normal. Extremities:  No clubbing or cyanosis. Neurologic:  Alert and oriented x 3. Skin:  Intact without lesions or rashes. Cervical Nodes:  no significant adenopathy Psych:  Normal affect.   Impression & Recommendations:  Problem # 1:  DYSPNEA (ICD-786.05) Patient continues to complain of dyspnea but this is  chronic and probably related to weight and deconditioning. At this point no further cardiovascular testing is suggested.  Problem # 2:  HYPERTENSION (ICD-401.9) She did have a hypertensive blood pressure response. T this was with exercise. However, she is now on Tribenzor has a new antihypertensive and I will defer followup to her primary providers.  Problem # 3:  PERICARDIAL EFFUSION (ICD-423.9) She's had no recurrence  of this. No change in therapy is indicated.  Patient Instructions: 1)  Your physician recommends that you schedule a follow-up appointment in: 15 months withDr Sitlaly Gudiel 2)  Your physician recommends that you continue on your current medications as directed. Please refer to the Current Medication list given to you today.

## 2010-10-29 LAB — URINALYSIS, ROUTINE W REFLEX MICROSCOPIC
Bilirubin Urine: NEGATIVE
Glucose, UA: >1000 mg/dL — AB
Hgb urine dipstick: NEGATIVE
Ketones, ur: NEGATIVE mg/dL
Leukocytes, UA: NEGATIVE
Nitrite: NEGATIVE
Protein, ur: NEGATIVE mg/dL
Specific Gravity, Urine: 1.025 (ref 1.005–1.030)
Urobilinogen, UA: 0.2 mg/dL (ref 0.0–1.0)
pH: 5.5 (ref 5.0–8.0)

## 2010-10-29 LAB — BASIC METABOLIC PANEL
BUN: 15 mg/dL (ref 6–23)
BUN: 33 mg/dL — ABNORMAL HIGH (ref 6–23)
CO2: 25 mEq/L (ref 19–32)
CO2: 26 mEq/L (ref 19–32)
Calcium: 7 mg/dL — ABNORMAL LOW (ref 8.4–10.5)
Calcium: 7.1 mg/dL — ABNORMAL LOW (ref 8.4–10.5)
Chloride: 109 mEq/L (ref 96–112)
Chloride: 114 mEq/L — ABNORMAL HIGH (ref 96–112)
Creatinine, Ser: 0.83 mg/dL (ref 0.4–1.2)
Creatinine, Ser: 1.01 mg/dL (ref 0.4–1.2)
GFR calc Af Amer: >60 mL/min (ref >60)
GFR calc Af Amer: >60 mL/min (ref >60)
GFR calc non Af Amer: 56 mL/min — ABNORMAL LOW (ref >60)
GFR calc non Af Amer: >60 mL/min (ref >60)
Glucose, Bld: 125 mg/dL — ABNORMAL HIGH (ref 70–99)
Glucose, Bld: 224 mg/dL — ABNORMAL HIGH (ref 70–99)
Potassium: 4.2 mEq/L (ref 3.5–5.1)
Potassium: 4.6 mEq/L (ref 3.5–5.1)
Sodium: 140 mEq/L (ref 135–145)
Sodium: 144 mEq/L (ref 135–145)

## 2010-10-29 LAB — CLOSTRIDIUM DIFFICILE EIA: C difficile Toxins A+B, EIA: NEGATIVE

## 2010-10-29 LAB — CBC
HCT: 38.8 % (ref 36.0–46.0)
HCT: 41 % (ref 36.0–46.0)
HCT: 46 % (ref 36.0–46.0)
Hemoglobin: 13.1 g/dL (ref 12.0–15.0)
Hemoglobin: 13.8 g/dL (ref 12.0–15.0)
Hemoglobin: 15.7 g/dL — ABNORMAL HIGH (ref 12.0–15.0)
MCH: 28 pg (ref 26.0–34.0)
MCH: 28 pg (ref 26.0–34.0)
MCH: 28.2 pg (ref 26.0–34.0)
MCHC: 33.7 g/dL (ref 30.0–36.0)
MCHC: 33.7 g/dL (ref 30.0–36.0)
MCHC: 34.1 g/dL (ref 30.0–36.0)
MCV: 82.7 fL (ref 78.0–100.0)
MCV: 82.9 fL (ref 78.0–100.0)
MCV: 82.9 fL (ref 78.0–100.0)
Platelets: 172 10*3/uL (ref 150–400)
Platelets: 183 10*3/uL (ref 150–400)
Platelets: 203 10*3/uL (ref 150–400)
RBC: 4.68 MIL/uL (ref 3.87–5.11)
RBC: 4.94 MIL/uL (ref 3.87–5.11)
RBC: 5.56 MIL/uL — ABNORMAL HIGH (ref 3.87–5.11)
RDW: 14.3 % (ref 11.5–15.5)
RDW: 14.7 % (ref 11.5–15.5)
RDW: 14.8 % (ref 11.5–15.5)
WBC: 11.2 10*3/uL — ABNORMAL HIGH (ref 4.0–10.5)
WBC: 12.9 10*3/uL — ABNORMAL HIGH (ref 4.0–10.5)
WBC: 8.9 10*3/uL (ref 4.0–10.5)

## 2010-10-29 LAB — POCT CARDIAC MARKERS
CKMB, poc: 2.8 ng/mL (ref 1.0–8.0)
CKMB, poc: 3.8 ng/mL (ref 1.0–8.0)
Myoglobin, poc: >500 ng/mL (ref 12–200)
Myoglobin, poc: >500 ng/mL (ref 12–200)
Troponin i, poc: <0.05 ng/mL (ref 0.00–0.09)
Troponin i, poc: <0.05 ng/mL (ref 0.00–0.09)

## 2010-10-29 LAB — STOOL CULTURE

## 2010-10-29 LAB — POCT I-STAT, CHEM 8
BUN: 46 mg/dL — ABNORMAL HIGH (ref 6–23)
Calcium, Ion: 1.01 mmol/L — ABNORMAL LOW (ref 1.12–1.32)
Chloride: 107 mEq/L (ref 96–112)
Creatinine, Ser: 1.3 mg/dL — ABNORMAL HIGH (ref 0.4–1.2)
Glucose, Bld: 303 mg/dL — ABNORMAL HIGH (ref 70–99)
HCT: 51 % — ABNORMAL HIGH (ref 36.0–46.0)
Hemoglobin: 17.3 g/dL — ABNORMAL HIGH (ref 12.0–15.0)
Potassium: 4 mEq/L (ref 3.5–5.1)
Sodium: 140 mEq/L (ref 135–145)
TCO2: 24 mmol/L (ref 0–100)

## 2010-10-29 LAB — GLUCOSE, CAPILLARY
Glucose-Capillary: 109 mg/dL — ABNORMAL HIGH (ref 70–99)
Glucose-Capillary: 112 mg/dL — ABNORMAL HIGH (ref 70–99)
Glucose-Capillary: 117 mg/dL — ABNORMAL HIGH (ref 70–99)
Glucose-Capillary: 131 mg/dL — ABNORMAL HIGH (ref 70–99)
Glucose-Capillary: 150 mg/dL — ABNORMAL HIGH (ref 70–99)
Glucose-Capillary: 150 mg/dL — ABNORMAL HIGH (ref 70–99)
Glucose-Capillary: 150 mg/dL — ABNORMAL HIGH (ref 70–99)
Glucose-Capillary: 158 mg/dL — ABNORMAL HIGH (ref 70–99)
Glucose-Capillary: 160 mg/dL — ABNORMAL HIGH (ref 70–99)
Glucose-Capillary: 209 mg/dL — ABNORMAL HIGH (ref 70–99)
Glucose-Capillary: 228 mg/dL — ABNORMAL HIGH (ref 70–99)
Glucose-Capillary: 247 mg/dL — ABNORMAL HIGH (ref 70–99)
Glucose-Capillary: 268 mg/dL — ABNORMAL HIGH (ref 70–99)
Glucose-Capillary: 329 mg/dL — ABNORMAL HIGH (ref 70–99)

## 2010-10-29 LAB — D-DIMER, QUANTITATIVE (NOT AT ARMC): D-Dimer, Quant: 2.73 ug/mL-FEU — ABNORMAL HIGH (ref 0.00–0.48)

## 2010-10-29 LAB — URINE MICROSCOPIC-ADD ON

## 2010-10-29 LAB — HEMOCCULT GUIAC POC 1CARD (OFFICE): Fecal Occult Bld: NEGATIVE

## 2010-10-31 LAB — BASIC METABOLIC PANEL
BUN: 20 mg/dL (ref 6–23)
CO2: 26 mEq/L (ref 19–32)
Calcium: 8.8 mg/dL (ref 8.4–10.5)
Chloride: 103 mEq/L (ref 96–112)
Creatinine, Ser: 0.85 mg/dL (ref 0.4–1.2)
GFR calc Af Amer: >60 mL/min (ref >60)
GFR calc non Af Amer: >60 mL/min (ref >60)
Glucose, Bld: 290 mg/dL — ABNORMAL HIGH (ref 70–99)
Potassium: 5 mEq/L (ref 3.5–5.1)
Sodium: 138 mEq/L (ref 135–145)

## 2010-10-31 LAB — GLUCOSE, CAPILLARY
Glucose-Capillary: 252 mg/dL — ABNORMAL HIGH (ref 70–99)
Glucose-Capillary: 274 mg/dL — ABNORMAL HIGH (ref 70–99)

## 2010-10-31 LAB — POCT HEMOGLOBIN-HEMACUE: Hemoglobin: 14.5 g/dL (ref 12.0–15.0)

## 2010-12-28 NOTE — Assessment & Plan Note (Signed)
OFFICE VISIT   [name], [name]  DOB:  1951-03-23                                       11/30/2009  [id number]   The patient had laser ablation of her right greater saphenous vein with  greater than 20 stab phlebectomies performed today for painful  varicosities in the right leg secondary to venous hypertension from  greater saphenous reflux.  She tolerated the procedure well.  Will  return in 1 week with venous duplex exam to check for closure of the  great saphenous vein.     Nelda Severe Kellie Simmering, M.D.  Electronically Signed   JDL/MEDQ  D:  11/30/2009  T:  12/01/2009  Job:  [id number]

## 2010-12-28 NOTE — Assessment & Plan Note (Signed)
[place] OFFICE NOTE   [name], [name]                     MRN:          [id number]  DATE:06/18/2007                            DOB:          11/06/1950    PRIMARY CARE PHYSICIAN:  [name], M.D.   REASON FOR PRESENTATION:  Evaluate patient with a pericardial effusion  and hypertension.   HISTORY OF PRESENT ILLNESS:  The patient is now 60 years old.  She  returns for followup of her pericardial effusion and multiple  cardiovascular risk factors.  She did have her last echocardiogram in  March 2008.  It demonstrated moderate pericardial effusion.  There was  no evidence of tamponade physiology.  It did not seem to have appeared  any different from previous.  The patient has done well since I last saw  her.  She has gained 10 pounds.  She has not been exercising.  I do not  know that she has been watching her diet.  She admits all of this.  She  said she does get a little dyspneic when she does do some walking.  This  seems to be baseline.  She has chronically slept on 3 pillows and this  is not different.  She is not having any new PND or orthopnea.  She is  not having any new chest discomfort, neck or arm discomfort.  She is not  having any new palpitations, presyncope, or syncope.   PAST MEDICAL HISTORY:  1. Nonobstructive coronary disease, well preserved ejection fraction.  2. Pericardial effusion of unclear etiology.  3. Dyslipidemia.  4. Diabetes mellitus.  5. Hypertension.  6. Gastroesophageal reflux disease.  7. Osteoarthritis.  8. Cholecystectomy.  9. Tonsillectomy.  10.Eye surgery.   ALLERGIES:  PENICILLIN.   MEDICATIONS:  1. Janumet 50/1,000 b.i.d.  2. Glipizide 5 mg b.i.d.  3. NovoLog.  4. Diovan HCT 320/12.5 daily.  5. Quinapril 40 mg daily.  6. Toprol 25 mg b.i.d.  7. Nexium 40 mg daily.  8. Aspirin 325 mg daily.   REVIEW OF SYSTEMS:  As stated in the HPI and  otherwise negative for  other systems.   PHYSICAL EXAMINATION:  GENERAL:  The patient is in no distress.  VITAL SIGNS:  Blood pressure 154/72, heart rate 90 and regular, weight  244 pounds, body mass index 36.  HEENT:  Eyelids unremarkable.  Pupils are equal, round, and reactive to  light.  Fundi not visualized.  Oral mucosa unremarkable.  NECK:  No jugular venous distention at 45 degrees (very compromised exam  secondary to the size of her neck).  Carotid upstroke brisk and  symmetric.  No bruits.  No obvious thyromegaly.  LYMPHATICS:  No cervical, axillary, or inguinal adenopathy.  LUNGS:  Clear to auscultation bilaterally.  BACK:  No costovertebral angle tenderness.  CHEST:  Unremarkable.  HEART:  PMI not displaced or sustained.  S1 and S2 within normal limits.  No S3, no S4, no clicks, no rubs, no murmurs.  ABDOMEN:  Morbidly obese and always distended.  No rebound.  No  guarding.  Positive bowel  sounds, normal in frequency and pitch.  No  hepatomegaly.  No obvious splenomegaly.  SKIN:  No rashes.  No nodules.  EXTREMITIES:  Two plus pulses throughout.  No edema.  No cyanosis.  No  clubbing.  NEUROLOGIC:  Oriented to person, place and time.  Cranial nerves II-XII  grossly intact.  Motor grossly intact.   EKG:  Sinus rhythm, rate 92, rightward axis, diffuse nonspecific T wave  changes.   ASSESSMENT/PLAN:  1. Pericardial effusion.  This has been chronic and moderate.  There      is no evidence of tamponade today.  At this point, I will repeat an      echocardiogram in March 2009, this would be a year since her last      evaluation.  2. Hypertension.  Blood pressure is not at target.  I am going to      increase her metoprolol to 50 mg twice a day.  3. Obesity.  We again had a discussion about the need to exercise.  I      have suggested the [employer] or [employer].  Hopefully,      she can begin to comply with this.   FOLLOWUP:  I will see the patient back in 6  months or sooner if needed.     Minus Breeding, MD, [facility]  Electronically Signed    JH/MedQ  DD: 06/18/2007  DT: 06/18/2007  Job #: [id number]   cc:   [name], M.D.

## 2010-12-28 NOTE — Assessment & Plan Note (Signed)
[place] OFFICE NOTE   [name], [name]                     MRN:          [id number]  DATE:12/26/2007                            DOB:          1950-10-25    PRIMARY:  Chipper Herb, M.D.   REASON FOR PRESENTATION:  A patient with pericardial effusion.  She also  has hypertension.   HISTORY OF PRESENT ILLNESS:  The patient is 60 years old.  Since I last  saw her she has had no further cardiovascular symptoms.  She has had  none of the dyspnea that she used to have.  She did get an  echocardiogram in March to followup pericardial effusion.  This  demonstrated a small effusion posterior to the right atrium.  Otherwise  it was an unremarkable echo.   The patient is feeling well enough now, that she is thinking of starting  an exercise regimen.  She is building an exercise room her house.  She  denies any chest pressure, neck, or arm discomfort.  She had no  palpitation, presyncope, or syncope.  She has had no PND or orthopnea.   PAST MEDICAL HISTORY:  1. Nonobstructive coronary artery disease.  2. Well-preserved ejection fraction.  3. Pericardial effusion of unclear etiology (small).  4. Dyslipidemia.  5. Diabetes mellitus.  6. Hypertension.  7. Gastroesophageal reflux disease.  8. Osteoarthritis.  9. Cholecystectomy.  10.Tonsillectomy.  11.Eye surgery.   ALLERGIES:  PENICILLIN.   MEDICATIONS:  1. Janumet 50/100 b.i.d.  2. Glipizide 5 mg b.i.d.  3. NovoLog.  4. Diovan/HCT 320/12.5 daily.  5. Quinapril 40 mg daily.  6. Metoprolol 50 mg b.i.d.  7. Aspirin 325 mg daily.  8. Nexium 40 mg daily.  9. Vitamin D (recently started).   REVIEW OF SYSTEMS:  As stated in the HPI otherwise negative for other  systems.   PHYSICAL EXAMINATION:  The patient is in no distress.  Blood pressure 139/76, heart rate 78 and regular, weight 240 pounds,  body mass index 36.  HEENT:  Eyelids unremarkable.   Pupils equal, round, and react to light.  Fundi not visualized.  Oral mucosa normal.  NECK:  No jugular distention at 45 degrees.  Carotid upstroke brisk and  symmetric.  No bruits, no thyromegaly.  LYMPHATICS:  No cervical, axillary, or inguinal adenopathy.  LUNGS:  Clear to auscultation bilaterally.  BACK:  No costovertebral angle tenderness.  CHEST:  Unremarkable.  HEART:  PMI not displaced or sustained, S1-S2 within normal limits, no  S3, no S4, no clicks, no rubs, no murmurs.  ABDOMEN:  Morbidly obese, positive bowel sounds, normal in frequency and  pitch.  No bruits, no rebound, no guarding, nor midline pulsatile mass.  No hepatomegaly or splenomegaly.  SKIN:  No rashes, no nodules.  EXTREMITIES:  2+ pulses throughout, trace bilateral ankle edema.  No  cyanosis nor clubbing.  NEURO:  Oriented to person, place, and time.  Cranial nerves II-XII  grossly intact, motor grossly intact.   EKG:  Sinus rhythm, rate 78, axis within normal limits, intervals within  normal limits, no acute  ST wave change.   ASSESSMENT/PLAN:  1. Pericardial effusion.  This was small by recent echo.  The etiology      has not been determined.  It is idiopathic.  It is not causing any      symptoms.  At this point, I will continue to follow this      clinically.  I probably will repeat an echo again in 1 year.  2. Morbid obesity.  I encouraged the patient's desire to lose weight      with diet and exercise.  3. Hypertension.  Blood pressure is well-controlled.  She will      continue the medications as listed.   FOLLOWUP:  I will see the patient, again, in 1 year after her  echocardiogram in March.     Minus Breeding, MD, [facility]  Electronically Signed    JH/MedQ  DD: 12/26/2007  DT: 12/26/2007  Job #: [id number]   cc:   Chipper Herb, M.D.

## 2010-12-28 NOTE — Consult Note (Signed)
NEW PATIENT CONSULTATION   [name]  DOB:  30-Jan-1951                                       03/02/2009  [id number]   This is a new patient consultation.   Patient is a 60 year old female referred for edema of the right lower  extremity with a long history of venous insufficiency.  She states she  has had varicose veins for many years in the right leg and has a history  of thrombophlebitis in the right leg with thrombus and varicosities.  She has been having increasing pain in the last 6 months involving her  distal thigh and calf with darkening of the skin, which she has noticed  over this time period in the mid-to-distal calf.  She had no stasis  ulcers bleeding or recent thrombophlebitis or deep venous thrombosis.  Her right ankle does swell significantly as the day progresses.  She  works in [employer] and stands during her job, and her symptoms worsen as  the day progresses making it increasingly difficult for her to work.   PAST MEDICAL HISTORY:  1. Nonobstructive coronary artery disease with good ejection fraction.  2. Dyslipidemia.  3. Diabetes mellitus.  4. Hypertension.  5. GERD.  6. Osteoarthritis.  7. Negative for stroke or COPD.   PAST SURGICAL HISTORY:  1. Cholecystectomy.  2. Tonsillectomy.  3. Bilateral cataract surgery.   Family history is negative for coronary artery disease, diabetes, and  stroke.   SOCIAL HISTORY:  She is married and has three children.  Does work in  [employer] fund[employer].  She does not use tobacco or alcohol.   REVIEW OF SYSTEMS:  The patient does have dyspnea on exertion,  occasional palpitations.  Has had a history of arrhythmias, urinary  frequency, arthritis, joint pain.   ALLERGIES:  PENICILLIN.   MEDICATIONS:  Please see health history form.   PHYSICAL EXAMINATION:  Blood pressure 166/80, heart rate 96,  respirations 14.  Generally, she is an obese middle-aged female in no  apparent distress.  Alert  and x3.  Neck is supple with 3+ carotid pulses  palpable.  No bruits are audible.  Neurologic exam is normal.  No  palpable adenopathy in the neck.  Chest is clear to auscultation.  Cardiovascular:  Regular rhythm.  No murmur.  Abdomen:  Soft and obese.  No masses are palpable.  She has 3+ femoral, popliteal, and dorsalis  pedis pulses bilaterally.  Right leg has obvious venous insufficiency  with multiple large varicosities in the medial thigh down to the medial  calf areas, some of which are partially thrombosed.  There is no  tenderness.  She does have 1 to 2+ edema of the right ankle with  hyperpigmentation in the medial calf extending to the medial malleolus.  No active ulcers are noted.  Left leg has no edema and no varicosities  noted.   Venous duplex has been done on 2 occasions at [facility] and  reveals no evidence of deep venous obstruction in the right leg but  gross reflux in the right great saphenous vein with areas of chronic  thrombus in the mid-to-distal greater saphenous vein.   I think she does have severe symptomatology from reflux at the right  saphenofemoral junction and great saphenous vein with previous evidence  of some old partial occlusion in the great saphenous vein  from the mid  thigh distally.  These large varicosities were created by this of  reflux, and the venous hypertension in the distal leg is becoming  evident with edema and hyperpigmentation.  I think she should treat with  elastic compression stockings (long-leg 20 mm - 30 mm) as well as  elevation and ibuprofen on a daily basis to see if this will help her  symptoms.  She will return in 3 months with repeat venous duplex exam in  our vascular lab to confirm these findings.  If she has had no  improvement, I think she would benefit from laser ablation of the right  great saphenous vein (probably mid thigh to saphenofemoral junction) as  well as multiple stab phlebectomy.  She will return  in 3 months.   Nelda Severe Kellie Simmering, M.D.  Electronically Signed   JDL/MEDQ  D:  03/02/2009  T:  03/03/2009  Job:  [id number]   cc:   Minus Breeding, MD, [facility]  Chipper Herb, M.D.

## 2010-12-28 NOTE — Assessment & Plan Note (Signed)
OFFICE VISIT   [name], [name]  DOB:  May 21, 1951                                       12/08/2009  [id number]   The patient is 1 week post laser ablation of her right great saphenous  vein with greater than 20 stab phlebectomies for painful varicosities  and venous hypertension.  She states that she has already noticed a  diminished edema in the right ankle as well as better coloration in this  area.  She does have very dry scaly skin.  She has had some difficulty  wearing her elastic compression stockings and is doing the best she can.  She has had some mild to moderate discomfort in the medial aspect of the  thigh where the greater saphenous vein is located in the distal thigh.   Venous duplex exam today reveals no evidence of deep venous obstruction  with total occlusion of the great saphenous vein from the knee to the  proximal thigh with some very slight flow in the proximal GSV.   On exam she has mild tenderness along the course of the great saphenous  vein from the saphenofemoral junction to the thigh with nice healing of  stab phlebectomy wounds.  I reassured her regarding these findings.  She  will wear the stocking for 1 more week and return to see [place] on a p.r.n.  basis.     Nelda Severe Kellie Simmering, M.D.  Electronically Signed   JDL/MEDQ  D:  12/08/2009  T:  12/09/2009  Job:  [id number]

## 2010-12-28 NOTE — Assessment & Plan Note (Signed)
OFFICE VISIT   LENDY, BUSHNELL  DOB:  02/02/1951                                       10/13/2009  [id number]   This patient returns today for continued follow-up regarding her severe  venous insufficiency in the right leg which has been present for many  years.  She has had multiple episodes of thrombophlebitis in the right  distal thigh, treated with aspirin on many occasions.  She has been  unable to wear her stockings on a daily basis because of difficulty  getting the stockings on and inability to have family members help.  The  times she has worn them, she has not had any relief of her symptoms.  She elevates her legs and take ibuprofen with no improvement in her  symptoms.  She continues have aching, throbbing, and burning discomfort  comfort in the proximal and distal thigh extending into the calf with  distal swelling as the day goes by.   Her venous duplex exam reveals no evidence of deep venous obstruction in  the right leg with gross reflux in the right great saphenous vein down  to the mid to distal thigh with some areas of some chronic thrombus in  the distal right great saphenous vein.  This supplies the large  varicosities in the distal thigh.   She has not improved with conservative measures and I think the best  plan would be to proceed with laser ablation of the right great  saphenous vein from mid thigh to the saphenofemoral junction with  multiple stab phlebectomies done as a single procedure to relieve her  symptoms.  We will proceed with precertification to improve the symptoms  in this lady whose symptoms are affecting her daily living.     Nelda Severe Kellie Simmering, M.D.  Electronically Signed   JDL/MEDQ  D:  10/13/2009  T:  10/14/2009  Job:  [id number]

## 2010-12-28 NOTE — Procedures (Signed)
LOWER EXTREMITY VENOUS REFLUX EXAM   INDICATION:  Right lower extremity venous insufficiency, right greater  saphenous vein reflux / chronic thrombosis.   EXAM:  Using color-flow imaging and pulse Doppler spectral analysis, the  right common femoral, superficial femoral, popliteal, posterior tibial,  greater and lesser saphenous veins were evaluated.  There is no evidence  suggesting deep venous insufficiency in the right lower extremity.   The right saphenofemoral junction is not competent with reflux of > 500  milliseconds.  The right greater saphenous vein is not competent with  reflux of > 500 milliseconds.   The right greater saphenous vein demonstrates partial occlusive chronic  thrombus extending from the proximal thigh to mid calf levels.   The right proximal short saphenous vein demonstrates competency.   GSV Diameter (used if found to be incompetent only)                                            Right    Left  Proximal Greater Saphenous Vein           0.96 cm  cm  Proximal-to-mid-thigh                     cm       cm  Mid thigh                                 0.8 cm   cm  Mid-distal thigh                          cm       cm  Distal thigh                              0.56 cm  cm  Knee                                      0.8 cm   cm   IMPRESSION:  1. Right greater saphenous vein reflux noted, as described above.  2. The right greater saphenous vein is not tortuous.  3. The right deep venous system is competent.        ___________________________________________  Nelda Severe. Kellie Simmering, M.D.   CH/MEDQ  D:  10/15/2009  T:  10/15/2009  Job:  [id number]

## 2010-12-29 ENCOUNTER — Encounter: Payer: Self-pay | Admitting: Family Medicine

## 2010-12-31 NOTE — H&P (Signed)
Alison, [name]                        ACCOUNT NO.:  [id number]   MEDICAL RECORD NO.:  [id number]                   PATIENT TYPE:  OIB   LOCATION:  2892                                 FACILITY:  [facility]   PHYSICIAN:  Garey Ham, M.D.             DATE OF BIRTH:  01-22-1951   DATE OF ADMISSION:  09/03/2002  DATE OF DISCHARGE:                                HISTORY & PHYSICAL   PREOPERATIVE DIAGNOSIS:  Senile nuclear and posterior subcapsular cataract  right eye.   POSTOPERATIVE DIAGNOSIS:  Senile nuclear and posterior subcapsular cataract  right eye.   OPERATION:  Planned extracapsular cataract extraction - phacoemulsification,  primary insertion of posterior chamber intraocular lens implant.   SURGEON:  Garey Ham, M.D.   ASSISTANT:  Nurse.   ANESTHESIA:  Local 4% Xylocaine, 0.75 Marcaine, anesthesia standby required.  The patient given sodium Pentothal or Ditropan intravenously during the  period of retrobulbar blocking.   DESCRIPTION OF PROCEDURE:  After the patient was prepped and draped, the lid  speculum was inserted in the right eye.  The eye was turned downward and a  superior rectus traction suture placed.  Schiotz tonometry was recorded as 6  scale units with a 5.5 g weight.  A peritomy was performed adjacent to the  limbus from the 11 to 1 o'clock position.  The corneoscleral junction was  cleaned and a corneoscleral groove made with a 45 degree Superblade.  The  anterior chamber was then entered with the 2.5 mm diamond keratome at the 12  o'clock position and the 15 degree blade at the 2:30 position.  Using a bent  26-gauge needle on a Healon syringe, a circular capsulorrhexis was begun and  then completed with the Grabow forceps.  Hydrodissection and  hydrodelineation were performed using 1% Xylocaine.  The 30 degree  phacoemulsification tip was then inserted with slow controlled  emulsification of the rather firm nucleus.  Total ultrasonic  time was two  minutes 15 seconds.  Average power level 19%.,  Total amount of fluid used  was 150 cc.  Following removal of the nucleus, the residual cortex was  aspirated with the irrigation aspiration tip.  The posterior capsul appeared  intact with a brilliant red fundus reflex.  It was therefore elected to  insert an [employer] [id number] NB silicon three-piece posterior  chamber intraocular lens implant.  Diopter strength +15.50.  This was  inserted with the McDonald forceps into the anterior chamber and then  centered into the capsular bag using the [facility] lens rotator.  The lens  appeared to be well centered.  The Healon, which had been used during the  procedure, was aspirated and replaced with balanced salt solution and  Miochol ophthalmic solution.  The operative incisions appeared to be self-  sealing and no sutures were required.  Maxitrol ointment was instilled in  the conjunctival cul-de-sac and the light patch and  protector shield  applied.  Duration of the procedure and anesthesia administration was 45  minutes.  The patient tolerated the procedure in general and left the  operating room for the recovery room in good condition.                                               Garey Ham, M.D.    HNJ/MEDQ  D:  09/03/2002  T:  09/03/2002  Job:  [id number]

## 2010-12-31 NOTE — H&P (Signed)
[place]  Patient:    [name], [name] Visit Number: [id number] MRN: [id number]          Service Type: DSU Location: [place] 01 Attending Physician:  Charlette Caffey Dictated by:   Garey Ham, M.D. Admit Date:  02/19/2002 Discharge Date: 02/19/2002                           History and Physical  REASON FOR ADMISSION:  This was a planned outpatient surgical admission of this 60 year old white female, non-insulin-dependent diabetic, admitted for cataract/implant surgery of the left eye.  PRESENT ILLNESS:  This patient has been followed in my office since January 26, 1993 for possible diabetic retinopathy.  At that time, the patient had non-insulin-dependent diabetes mellitus for over six years duration and was controlled on Micronase.  Initial examination revealed visual acuity of 20/20 in each eye with her myopic correction.  No diabetic retinopathy was present.  Over the ensuing years, the patient has developed posterior subcapsular cataract formations in both eyes, greater in the left than right eye, noted first in July 2002.  By Dec 27, 2001, vision had deteriorated with best correction in the left eye to 20/80 and it was elected to proceed with cataract/implant surgery.  The patient was given oral discussion and printed information concerning the procedure and its possible complications.  She signed an informed consent and arrangements were made for her outpatient admission at this time.  PAST MEDICAL HISTORY:  The patient continues under the care of Dr. Clois Dupes and is currently taking multiple medications including Glucotrol XL, Actos, Prempro, coated aspirin, Glucophage, Aciphex, Accupril and Diovan.  She is said to be allergic to PENICILLIN and is felt to be a minimal risk for the proposed surgery under local anesthesia.  ALLERGIES:  PENICILLIN.  REVIEW OF SYSTEMS:  No cardiorespiratory complaints.  The  patient states her blood sugar is 170 this morning, which is a little higher than it usually runs.  PHYSICAL EXAMINATION:  VITAL SIGNS:  As recorded on admission, blood pressure 152/81, temperature 97.0, pulse 104, respirations 18.  GENERAL APPEARANCE:  The patient is a pleasant, slightly anxious, well-nourished, well-developed 60 year old white female in no acute distress.  HEENT:  Ocular exam as noted above.  CHEST:  Lungs clear to percussion and auscultation.  HEART:  Normal sinus rhythm.  No cardiomegaly.  No murmurs.  ABDOMEN:  Negative.  EXTREMITIES:  Negative.  ADMISSION DIAGNOSIS:  Posterior subcapsular cataracts, greater in left than right eye.  SURGICAL PLAN:  Cataract/implant surgery, left eye now, right eye later as needed. Dictated by:   Garey Ham, M.D. Attending Physician:  Charlette Caffey DD:  02/19/02 TD:  02/21/02 Job: [id number]

## 2010-12-31 NOTE — Cardiovascular Report (Signed)
[name], [name]              ACCOUNT NO.:  [id number]   MEDICAL RECORD NO.:  [id number]          PATIENT TYPE:  OIB   LOCATION:  6501                         FACILITY:  [facility]   PHYSICIAN:  Minus Breeding, M.D.   DATE OF BIRTH:  01/17/1951   DATE OF PROCEDURE:  01/06/2005  DATE OF DISCHARGE:                              CARDIAC CATHETERIZATION   PRIMARY CARE PHYSICIAN:  Dr. Morrie Sheldon.   REASON FOR PRESENTATION:  Evaluate the patient with chest pain and a  Cardiolite suggesting anterior ischemia.  Chest CT scan, (coronary CT scan),  suggested a 50-70% left anterior descending stenosis.   RESULTS:  Hemodynamics:  LV 147/20, A 147/74.  Coronaries:  The left main  was normal.  The LAD had osteal 25% stenosis.  There was 25-30% stenosis  after the first diagonal.  The circumflex in the AV groove was normal.  OM1  to OM3 were small and normal.  OM4 was large, branching and normal.  The  right coronary artery was dominant.  There were luminal irregularities.  There was distal 25% stenosis after the PDA.  Left ventriculogram:  The left  ventriculogram was obtained in the RAO projection.  The ejection fraction  was 65% with normal wall motion.   CONCLUSION:  Nonobstructive coronary disease.   PLAN:  The patient will have medical management.      JH/MEDQ  D:  01/06/2005  T:  01/06/2005  Job:  [id number]   cc:   Chipper Herb, M.D.  [place]  [place] [address]  Fax: [phone]

## 2010-12-31 NOTE — H&P (Signed)
[name], [name]                        ACCOUNT NO.:  [id number]   MEDICAL RECORD NO.:  [id number]                   PATIENT TYPE:  OIB   LOCATION:  2892                                 FACILITY:  [facility]   PHYSICIAN:  Garey Ham, M.D.             DATE OF BIRTH:  03/30/1951   DATE OF ADMISSION:  09/03/2002  DATE OF DISCHARGE:                                HISTORY & PHYSICAL   REASON FOR ADMISSION:  This was a planned outpatient surgical admission of  this 60 year old white female diabetic admitted for cataract implant surgery  of the right eye.   HISTORY OF PRESENT ILLNESS:  This patient has been noted to have diabetes  mellitus and progressive cataract formation and glaucoma in both eyes.  The  patient has undergone previous cataract implant surgery of the left eye at  this hospital on February 19, 2002.  The patient did well following surgery with  return of vision to 20/20 to 20/25.  Meanwhile, the unoperated eye has  deteriorated to 20/40 to 20/50.  The patient has requested similar cataract  implant surgery.  She signed and informed consent and arrangements were made  for her outpatient admission at this time.   PAST MEDICAL HISTORY:  See old chart.  The patient continues under the care  of Chipper Herb, M.D.   MEDICATIONS:  The patient is taking multiple medications including Diovan,  Accupril, aspirin (eliminated for the past week), Actos, Metformin,  Glucotrol, Zocor and multivitamin tablets.   REVIEW OF SYSTEMS:  No current cardiorespiratory complaints.   PHYSICAL EXAMINATION:  GENERAL APPEARANCE:  The patient is a pleasant, well-  nourished, well-developed white female in no acute ocular distress.  VITAL SIGNS:  As recorded on admission, blood pressure 159/78, pulse 103,  respiratory rate 18, temperature 97.  HEENT:  Visual acuity as noted above.  Slit lamp examination shows the eyes  are white and clear with a clear cornea, deep and clear anterior  chamber.  A  dense nuclear cataract is present in the right eye and a posterior chamber  intraocular lens implant in the left eye.  Detailed fundus examination  reveals no diabetic retinopathy.  The vitreus is clear.  Retina attached  with normal optic nerve, blood vessels and macula.  CHEST:  Lungs clear to percussion and auscultation.  CARDIOVASCULAR:  Normal sinus rhythm, no cardiomegaly, no murmurs.  ABDOMEN:  Negative.  EXTREMITIES:  Negative.   ADMISSION DIAGNOSES:  1. Senile cataract right eye.  2. Pseudophakia left eye.  3. Chronic open angle glaucoma left eye.   PLAN:  Cataract implant surgery right eye with intraocular lens  implantation.                                               Joneen Boers  Hassie Bruce, M.D.   Kerrie Buffalo  D:  09/03/2002  T:  09/03/2002  Job:  [id number]

## 2010-12-31 NOTE — Op Note (Signed)
[place]  Patient:    [name], [name] Visit Number: [id number] MRN: [id number]          Service Type: DSU Location: [place] 01 Attending Physician:  Charlette Caffey Dictated by:   Garey Ham, M.D. Proc. Date: 02/19/02 Admit Date:  02/19/2002 Discharge Date: 02/19/2002                             Operative Report  PREOPERATIVE DIAGNOSIS:  Posterior subcapsular cataract, left eye.  POSTOPERATIVE DIAGNOSIS:  Posterior subcapsular cataract, left eye.  OPERATION:  Planned extracapsular cataract extraction, primary insertion of posterior chamber intraocular lens implant.  SURGEON:  Garey Ham, M.D.  ASSISTANT:  Nurse.  ANESTHESIA:  Local 4% Xylocaine, 0.75% Marcaine, anesthesia standby required; patient given sodium pentothal intravenously during the period of retrobulbar injection.  DESCRIPTION OF PROCEDURE:  After the patient was prepped and draped, a lid speculum was inserted in the left eye.  The eye was turned downward and a superior rectus traction suture placed.  A peritomy was performed adjacent to the limbus from the 11 to 1 oclock position.  The corneoscleral junction was cleaned and a corneoscleral groove made with a 45 degree Superblade.  The anterior chamber was then entered with the 2.5-mm diamond keratome at the 12 oclock position and the 15 degree blade at the 2:30 position.  Using a bent 26-gauge needle on a Healon syringe, a circular capsulorrhexis was begun and then completed with the Grabow forceps.  Hydrodissection and hydrodelineation were performed using 1% Xylocaine.  The 30 degree phacoemulsification tip was then inserted with slow controlled emulsification of the lens nucleus, total ultrasonic time -- 2 minutes 18 seconds, average power level -- 17%, total amount of fluid used -- 90 cc.  Following removal of the nucleus, the residual cortex was aspirated with the irrigation-aspiration tip.   It was then elected to insert an [employer] [id number] three-piece silicone posterior chamber intraocular lens implant with UV absorber, diopter strength +17.00; this was inserted with the McDonald forceps into the anterior chamber and then centered into the capsular bag using the [facility] lens rotator; the lens appeared to be well-centered.  The Healon which had been used during the procedure was aspirated and replaced with balanced salt solution and Miochol ophthalmic solution.  The operative incision appeared to be self-sealing and no sutures were required.  The conjunctiva was brushed over the incision.  Maxitrol ointment was instilled in the conjunctival cul-de-sac.  A light patch and protective shield were applied to the operated left eye.  Duration of procedure and anesthesia administration -- 45 minutes.  The patient tolerated the procedure well in general and left the operating room for the recovery room in good condition. Dictated by:   Garey Ham, M.D. Attending Physician:  Charlette Caffey DD:  02/19/02 TD:  02/21/02 Job: [id number]

## 2011-03-24 ENCOUNTER — Encounter: Payer: Self-pay | Admitting: Cardiology

## 2011-04-08 ENCOUNTER — Telehealth: Payer: Self-pay | Admitting: Cardiology

## 2011-04-08 NOTE — Telephone Encounter (Signed)
Pt calling re ankles and feet are swollen x 1 week, now has a cough thinks she may be going into heart failure

## 2011-04-08 NOTE — Telephone Encounter (Signed)
Pt calls today because she has had a cough for the last few days with some swelling of her ankles. She thinks the swelling of her ankles may be because they have been in the dependent position.  Her weight is 250.  She denies shortness of breath, no angina.  Pt saw pcp PA a few days ago for a "terrible cough". She was prescribed a z-pack and cough syrup.  She says she still can't sleep or lay down with this cough.  She will call her pcp for appt as pt says" I think I need some prednisone for this". Pt states she does not think she has heart failure.  Horton Chin RN

## 2011-04-20 NOTE — Telephone Encounter (Signed)
Called to follow up on pt.  Family member stats she is at work today and that she is doing "OK".  Requested family to ask pt to call back if she is continuing to have problems.

## 2011-05-03 ENCOUNTER — Encounter: Payer: Self-pay | Admitting: Cardiology

## 2011-05-06 ENCOUNTER — Inpatient Hospital Stay (HOSPITAL_COMMUNITY)
Admission: EM | Admit: 2011-05-06 | Discharge: 2011-05-11 | DRG: 544 | Disposition: A | Discharge status: Home / Self Care | Payer: BC Managed Care – PPO | Attending: Internal Medicine | Admitting: Internal Medicine

## 2011-05-06 ENCOUNTER — Emergency Department (HOSPITAL_COMMUNITY): Payer: BC Managed Care – PPO

## 2011-05-06 DIAGNOSIS — J9600 Acute respiratory failure, unspecified whether with hypoxia or hypercapnia: Secondary | ICD-10-CM | POA: Diagnosis present

## 2011-05-06 DIAGNOSIS — M4800 Spinal stenosis, site unspecified: Secondary | ICD-10-CM | POA: Diagnosis present

## 2011-05-06 DIAGNOSIS — Z7982 Long term (current) use of aspirin: Secondary | ICD-10-CM

## 2011-05-06 DIAGNOSIS — T50995A Adverse effect of other drugs, medicaments and biological substances, initial encounter: Secondary | ICD-10-CM | POA: Diagnosis not present

## 2011-05-06 DIAGNOSIS — R0602 Shortness of breath: Secondary | ICD-10-CM

## 2011-05-06 DIAGNOSIS — I509 Heart failure, unspecified: Secondary | ICD-10-CM | POA: Diagnosis present

## 2011-05-06 DIAGNOSIS — I498 Other specified cardiac arrhythmias: Secondary | ICD-10-CM | POA: Diagnosis present

## 2011-05-06 DIAGNOSIS — Z794 Long term (current) use of insulin: Secondary | ICD-10-CM

## 2011-05-06 DIAGNOSIS — I5033 Acute on chronic diastolic (congestive) heart failure: Principal | ICD-10-CM | POA: Diagnosis present

## 2011-05-06 DIAGNOSIS — E785 Hyperlipidemia, unspecified: Secondary | ICD-10-CM | POA: Diagnosis present

## 2011-05-06 DIAGNOSIS — N179 Acute kidney failure, unspecified: Secondary | ICD-10-CM | POA: Diagnosis not present

## 2011-05-06 DIAGNOSIS — J189 Pneumonia, unspecified organism: Secondary | ICD-10-CM | POA: Diagnosis present

## 2011-05-06 DIAGNOSIS — I10 Essential (primary) hypertension: Secondary | ICD-10-CM | POA: Diagnosis present

## 2011-05-06 DIAGNOSIS — IMO0001 Reserved for inherently not codable concepts without codable children: Secondary | ICD-10-CM | POA: Diagnosis present

## 2011-05-06 DIAGNOSIS — N058 Unspecified nephritic syndrome with other morphologic changes: Secondary | ICD-10-CM | POA: Diagnosis not present

## 2011-05-06 LAB — URINALYSIS, ROUTINE W REFLEX MICROSCOPIC
Bilirubin Urine: NEGATIVE
Glucose, UA: 250 mg/dL — AB
Hgb urine dipstick: NEGATIVE
Ketones, ur: NEGATIVE mg/dL
Leukocytes, UA: NEGATIVE
Nitrite: NEGATIVE
Protein, ur: NEGATIVE mg/dL
Specific Gravity, Urine: 1.007 (ref 1.005–1.030)
Urobilinogen, UA: 0.2 mg/dL (ref 0.0–1.0)
pH: 5 (ref 5.0–8.0)

## 2011-05-06 LAB — CBC
HCT: 39.8 % (ref 36.0–46.0)
Hemoglobin: 12.7 g/dL (ref 12.0–15.0)
MCH: 26.5 pg (ref 26.0–34.0)
MCHC: 31.9 g/dL (ref 30.0–36.0)
MCV: 82.9 fL (ref 78.0–100.0)
Platelets: 372 10*3/uL (ref 150–400)
RBC: 4.8 MIL/uL (ref 3.87–5.11)
RDW: 14.1 % (ref 11.5–15.5)
WBC: 9.9 10*3/uL (ref 4.0–10.5)

## 2011-05-06 LAB — POCT I-STAT, CHEM 8
BUN: 22 mg/dL (ref 6–23)
Calcium, Ion: 1.15 mmol/L (ref 1.12–1.32)
Chloride: 103 mEq/L (ref 96–112)
Creatinine, Ser: 1 mg/dL (ref 0.50–1.10)
Glucose, Bld: 320 mg/dL — ABNORMAL HIGH (ref 70–99)
HCT: 41 % (ref 36.0–46.0)
Hemoglobin: 13.9 g/dL (ref 12.0–15.0)
Potassium: 4.6 mEq/L (ref 3.5–5.1)
Sodium: 139 mEq/L (ref 135–145)
TCO2: 25 mmol/L (ref 0–100)

## 2011-05-06 LAB — DIFFERENTIAL
Basophils Absolute: 0.1 10*3/uL (ref 0.0–0.1)
Basophils Relative: 1 % (ref 0–1)
Eosinophils Absolute: 0.4 10*3/uL (ref 0.0–0.7)
Eosinophils Relative: 4 % (ref 0–5)
Lymphocytes Relative: 23 % (ref 12–46)
Lymphs Abs: 2.3 10*3/uL (ref 0.7–4.0)
Monocytes Absolute: 0.8 10*3/uL (ref 0.1–1.0)
Monocytes Relative: 8 % (ref 3–12)
Neutro Abs: 6.4 10*3/uL (ref 1.7–7.7)
Neutrophils Relative %: 65 % (ref 43–77)

## 2011-05-06 LAB — TROPONIN I: Troponin I: <0.3 ng/mL (ref <0.30)

## 2011-05-06 LAB — CK TOTAL AND CKMB (NOT AT ARMC)
CK, MB: 2.3 ng/mL (ref 0.3–4.0)
Relative Index: INVALID (ref 0.0–2.5)
Total CK: 99 U/L (ref 7–177)

## 2011-05-06 LAB — POCT I-STAT TROPONIN I: Troponin i, poc: 0.01 ng/mL (ref 0.00–0.08)

## 2011-05-06 LAB — PRO B NATRIURETIC PEPTIDE: Pro B Natriuretic peptide (BNP): 67.9 pg/mL (ref 0–125)

## 2011-05-06 LAB — GLUCOSE, CAPILLARY: Glucose-Capillary: 374 mg/dL — ABNORMAL HIGH (ref 70–99)

## 2011-05-07 ENCOUNTER — Inpatient Hospital Stay (HOSPITAL_COMMUNITY): Payer: BC Managed Care – PPO

## 2011-05-07 DIAGNOSIS — I5033 Acute on chronic diastolic (congestive) heart failure: Secondary | ICD-10-CM

## 2011-05-07 LAB — GLUCOSE, CAPILLARY
Glucose-Capillary: 436 mg/dL — ABNORMAL HIGH (ref 70–99)
Glucose-Capillary: 433 mg/dL — ABNORMAL HIGH (ref 70–99)
Glucose-Capillary: 297 mg/dL — ABNORMAL HIGH (ref 70–99)
Glucose-Capillary: 324 mg/dL — ABNORMAL HIGH (ref 70–99)

## 2011-05-07 LAB — BASIC METABOLIC PANEL
BUN: 19 mg/dL (ref 6–23)
CO2: 30 mEq/L (ref 19–32)
Calcium: 9 mg/dL (ref 8.4–10.5)
Chloride: 102 mEq/L (ref 96–112)
Creatinine, Ser: 0.86 mg/dL (ref 0.50–1.10)
GFR calc Af Amer: >60 mL/min (ref >60)
GFR calc non Af Amer: >60 mL/min (ref >60)
Glucose, Bld: 301 mg/dL — ABNORMAL HIGH (ref 70–99)
Potassium: 4.2 mEq/L (ref 3.5–5.1)
Sodium: 141 mEq/L (ref 135–145)

## 2011-05-07 LAB — CBC
HCT: 37.9 % (ref 36.0–46.0)
Hemoglobin: 12.3 g/dL (ref 12.0–15.0)
MCH: 26.8 pg (ref 26.0–34.0)
MCHC: 32.5 g/dL (ref 30.0–36.0)
MCV: 82.6 fL (ref 78.0–100.0)
Platelets: 356 10*3/uL (ref 150–400)
RBC: 4.59 MIL/uL (ref 3.87–5.11)
RDW: 14.1 % (ref 11.5–15.5)
WBC: 8.4 10*3/uL (ref 4.0–10.5)

## 2011-05-07 LAB — CARDIAC PANEL(CRET KIN+CKTOT+MB+TROPI)
CK, MB: 2.2 ng/mL (ref 0.3–4.0)
CK, MB: 2.1 ng/mL (ref 0.3–4.0)
Relative Index: INVALID (ref 0.0–2.5)
Relative Index: INVALID (ref 0.0–2.5)
Total CK: 88 U/L (ref 7–177)
Total CK: 75 U/L (ref 7–177)
Troponin I: <0.3 ng/mL (ref <0.30)
Troponin I: <0.3 ng/mL (ref <0.30)

## 2011-05-07 LAB — TSH: TSH: 1.649 u[IU]/mL (ref 0.350–4.500)

## 2011-05-07 LAB — D-DIMER, QUANTITATIVE: D-Dimer, Quant: 1.02 ug/mL-FEU — ABNORMAL HIGH (ref 0.00–0.48)

## 2011-05-07 MED ORDER — IOHEXOL 300 MG/ML  SOLN
100.0000 mL | Freq: Once | INTRAMUSCULAR | Status: AC | PRN
  Administered 2011-05-07: 100 mL via INTRAVENOUS

## 2011-05-08 LAB — GLUCOSE, CAPILLARY
Glucose-Capillary: 284 mg/dL — ABNORMAL HIGH (ref 70–99)
Glucose-Capillary: 267 mg/dL — ABNORMAL HIGH (ref 70–99)
Glucose-Capillary: 314 mg/dL — ABNORMAL HIGH (ref 70–99)
Glucose-Capillary: 351 mg/dL — ABNORMAL HIGH (ref 70–99)

## 2011-05-08 LAB — BASIC METABOLIC PANEL
BUN: 24 mg/dL — ABNORMAL HIGH (ref 6–23)
CO2: 35 mEq/L — ABNORMAL HIGH (ref 19–32)
Calcium: 9.4 mg/dL (ref 8.4–10.5)
Chloride: 96 mEq/L (ref 96–112)
Creatinine, Ser: 1.04 mg/dL (ref 0.50–1.10)
GFR calc Af Amer: >60 mL/min (ref >60)
GFR calc non Af Amer: 54 mL/min — ABNORMAL LOW (ref >60)
Glucose, Bld: 307 mg/dL — ABNORMAL HIGH (ref 70–99)
Potassium: 5 mEq/L (ref 3.5–5.1)
Sodium: 139 mEq/L (ref 135–145)

## 2011-05-09 LAB — GLUCOSE, CAPILLARY
Glucose-Capillary: 255 mg/dL — ABNORMAL HIGH (ref 70–99)
Glucose-Capillary: 384 mg/dL — ABNORMAL HIGH (ref 70–99)
Glucose-Capillary: 362 mg/dL — ABNORMAL HIGH (ref 70–99)
Glucose-Capillary: 364 mg/dL — ABNORMAL HIGH (ref 70–99)

## 2011-05-09 LAB — BASIC METABOLIC PANEL
BUN: 35 mg/dL — ABNORMAL HIGH (ref 6–23)
CO2: 38 mEq/L — ABNORMAL HIGH (ref 19–32)
Calcium: 9.1 mg/dL (ref 8.4–10.5)
Chloride: 95 mEq/L — ABNORMAL LOW (ref 96–112)
Creatinine, Ser: 1.55 mg/dL — ABNORMAL HIGH (ref 0.50–1.10)
GFR calc Af Amer: 41 mL/min — ABNORMAL LOW (ref >60)
GFR calc non Af Amer: 34 mL/min — ABNORMAL LOW (ref >60)
Glucose, Bld: 278 mg/dL — ABNORMAL HIGH (ref 70–99)
Potassium: 4.6 mEq/L (ref 3.5–5.1)
Sodium: 139 mEq/L (ref 135–145)

## 2011-05-10 LAB — GLUCOSE, CAPILLARY
Glucose-Capillary: 259 mg/dL — ABNORMAL HIGH (ref 70–99)
Glucose-Capillary: 280 mg/dL — ABNORMAL HIGH (ref 70–99)
Glucose-Capillary: 224 mg/dL — ABNORMAL HIGH (ref 70–99)
Glucose-Capillary: 313 mg/dL — ABNORMAL HIGH (ref 70–99)
Glucose-Capillary: 363 mg/dL — ABNORMAL HIGH (ref 70–99)

## 2011-05-10 LAB — BASIC METABOLIC PANEL
BUN: 45 mg/dL — ABNORMAL HIGH (ref 6–23)
CO2: 33 mEq/L — ABNORMAL HIGH (ref 19–32)
Calcium: 9 mg/dL (ref 8.4–10.5)
Chloride: 95 mEq/L — ABNORMAL LOW (ref 96–112)
Creatinine, Ser: 1.52 mg/dL — ABNORMAL HIGH (ref 0.50–1.10)
GFR calc Af Amer: 42 mL/min — ABNORMAL LOW (ref >60)
GFR calc non Af Amer: 35 mL/min — ABNORMAL LOW (ref >60)
Glucose, Bld: 277 mg/dL — ABNORMAL HIGH (ref 70–99)
Potassium: 4.5 mEq/L (ref 3.5–5.1)
Sodium: 138 mEq/L (ref 135–145)

## 2011-05-11 LAB — BASIC METABOLIC PANEL
BUN: 48 mg/dL — ABNORMAL HIGH (ref 6–23)
CO2: 25 mEq/L (ref 19–32)
Calcium: 8.8 mg/dL (ref 8.4–10.5)
Chloride: 95 mEq/L — ABNORMAL LOW (ref 96–112)
Creatinine, Ser: 1.72 mg/dL — ABNORMAL HIGH (ref 0.50–1.10)
GFR calc Af Amer: 37 mL/min — ABNORMAL LOW (ref >60)
GFR calc non Af Amer: 30 mL/min — ABNORMAL LOW (ref >60)
Glucose, Bld: 244 mg/dL — ABNORMAL HIGH (ref 70–99)
Potassium: 5.6 mEq/L — ABNORMAL HIGH (ref 3.5–5.1)
Sodium: 136 mEq/L (ref 135–145)

## 2011-05-11 LAB — GLUCOSE, CAPILLARY
Glucose-Capillary: 263 mg/dL — ABNORMAL HIGH (ref 70–99)
Glucose-Capillary: 212 mg/dL — ABNORMAL HIGH (ref 70–99)
Glucose-Capillary: 354 mg/dL — ABNORMAL HIGH (ref 70–99)

## 2011-05-11 LAB — PROTIME-INR
INR: 1 (ref 0.00–1.49)
Prothrombin Time: 13.4 seconds (ref 11.6–15.2)

## 2011-05-15 NOTE — H&P (Signed)
NAMEJONATHON, DOOR NO.:  [id number]  MEDICAL RECORD NO.:  [id number]  LOCATION:  [id number]                         FACILITY:  [facility]  PHYSICIAN:  Jana Hakim, M.D. DATE OF BIRTH:  1951/06/12  DATE OF ADMISSION:  05/06/2011 DATE OF DISCHARGE:                             HISTORY & PHYSICAL   PRIMARY CARE PHYSICIAN:  Chipper Herb, MD  CHIEF COMPLAINT:  Worsening shortness of breath.  HISTORY OF PRESENT ILLNESS:  This is a 60 year old female who presents to the emergency department secondary to worsening shortness of breath over the past few days along with having increased swelling of both legs and orthopnea for the past 6 weeks.  She reports being seen several times in her primary care physician's office.  She also reports having a little bit of chest pain off and on.  The pain has been sharp and radiates into from the left side of her chest into her back.  The patient was seen in the emergency department.  Laboratory studies were performed as well as a chest x-ray.  A BNP was found to be 67.9, however, clinically, the patient appeared to have congestive heart failure syndrome was administered Lasix 40 mg IV x1 dose and began to diurese.  A troponin level was also performed and found to be negative initially at a level of 0.01.  The patient was referred for medical admission.  PAST MEDICAL HISTORY:  Significant for: 1. Hypertension. 2. Type 2 diabetes mellitus. 3. Dyslipidemia. 4. Morbid obesity. 5. Spinal stenosis.  PAST SURGICAL HISTORY:  History of a bilateral tubal ligation, cholecystectomy, tonsillectomy, and bilateral cataract surgeries.  ALLERGIES:  LEVEMIR, which causes a rash, allergy to PENICILLIN, which causes fainting and intolerance to STATINS, which cause muscle weakness.  SOCIAL HISTORY:  The patient is married with children.  She is a nonsmoker, nondrinker.  No history of illicit drug usage.  FAMILY HISTORY:  Positive for  coronary artery disease in her father and her mother had skin cancer.  REVIEW OF SYSTEMS:  Pertinent as mentioned above in the HPI.  All other organ systems are negative.  PHYSICAL EXAMINATION FINDINGS:  GENERAL:  This is a morbidly obese 68- year-old Caucasian female who is in no acute distress currently. VITAL SIGNS:  Temperature 98.2, blood pressure 160/69, heart rate 115, respirations 25, initially now 15, O2 sats 90%. HEENT:  Normocephalic, atraumatic.  Pupils equally round and reactive to light.  Extraocular movements are intact.  Funduscopic benign.  There is no scleral icterus.  Nares are patent bilaterally.  Oropharynx is clear. NECK:  Supple.  Full range of motion.  No thyromegaly, adenopathy, or jugular venous distention. CARDIOVASCULAR:  Regular rate and rhythm.  Normal S1-S2.  No murmurs, gallops or rubs appreciated. LUNGS:  Clear to auscultation bilaterally.  No rales, rhonchi or wheezes appreciated.  Chest wall is nontender.  No unusual masses.  Chest wall excursion symmetric and breathing is unlabored at this time. ABDOMEN:  Positive bowel sounds, soft, nontender, nondistended.  No hepatosplenomegaly. EXTREMITIES:  Without cyanosis, clubbing.  She does have 2+ edema which is pitting.  She does have mild hyperpigmentation along the anterior tibial aspects of both lower extremities as well.  NEUROLOGIC:  Nonfocal.  LABORATORY STUDIES:  White blood cell count 9.9, hemoglobin 12.7, hematocrit 39.8, platelets 372, neutrophils 65%, lymphocytes 23%. Sodium 139, potassium 4.6, chloride 103, CO2 25, BUN 22, creatinine 1.00, and glucose 320.  Urinalysis negative except for urine glucose 250, troponin 0.01.  EKG reveals a sinus tachycardia at a rate of 115, no acute ST-segment changes were seen, however.  Chest x-ray reveals cardiomegaly, patchy bilateral airspace opacities questionable edema versus infection.  ASSESSMENT:  A 60 year old female being admitted with: 1. Acute  diastolic congestive heart failure syndrome. 2. Shortness of breath. 3. Hypoxemia. 4. Hypertension. 5. Type 2 diabetes mellitus which is uncontrolled. 6. Dyslipidemia. 7. Morbid obesity. 8. Sinus tachycardia.  PLAN:  The patient will be admitted to a telemetry area for monitoring. Cardiac enzymes will be performed.  The patient will be placed on the CHF protocol and IV diuretic therapy of Lasix with potassium replacement therapy has been ordered.  Her regular medications will be further reconciled and sliding scale insulin coverage has been ordered.  The patient will be placed on DVT prophylaxis at this time.  Further workup will ensue pending the patient's clinical course and results of her studies.  The patient is a full code.     Jana Hakim, M.D.     HJ/MEDQ  D:  05/06/2011  T:  05/06/2011  Job:  [id number]  cc:   Chipper Herb, M.D.  Electronically Signed by Jana Hakim M.D. on 05/15/2011 10:22:01 PM

## 2011-05-22 NOTE — Discharge Summary (Signed)
[name], [name] NO.:  [id number]  MEDICAL RECORD NO.:  [id number]  LOCATION:  [id number]                         FACILITY:  [facility]  PHYSICIAN:  Domingo Mend, M.D. DATE OF BIRTH:  Oct 11, 1950  DATE OF ADMISSION:  05/06/2011 DATE OF DISCHARGE:  05/11/2011                              DISCHARGE SUMMARY   PRIMARY CARE PHYSICIAN:  Chipper Herb, MD.  CARDIOLOGIST:  Minus Breeding, MD, [facility].  DISCHARGE DIAGNOSES: 1. Acute hypoxic respiratory failure, resolved, secondary to community-     acquired pneumonia and acute diastolic heart failure. 2. Community-acquired pneumonia. 3. Acute diastolic congestive heart failure. 4. Hypertension. 5. Acute renal failure. 6. Type 2 diabetes mellitus, insulin dependent. 7. Hyperlipidemia. 8. Morbid obesity.  DISCHARGE MEDICATIONS: 1. Lantus insulin 95 units twice daily. 2. Levaquin 750 mg daily for two more days. 3. Aspirin 325 mg daily. 4. Livalo 1 mg daily. 5. Metoprolol 50 mg twice daily. 6. Mucinex 600 mg every 12 hours as needed for cough or congestion. 7. Nexium 40 mg daily. 8. Tribenzor 40/5/25 mg daily. 9. NovoLog insulin sliding scale as directed by primary care     physician. 10.She has been instructed to discontinue use of metformin and     Sudafed.  DISPOSITION AND FOLLOWUP:  [name] will be discharged home today in improved condition.  She will need to follow up with her primary care physician, Dr. Laurance Flatten in 4 days for a repeat BMET to follow up on her renal function and electrolytes.  CONSULTATION AT THIS HOSPITALIZATION:  Dr. Percival Spanish with Cardiology.  IMAGES AND PROCEDURES: 1. Include, chest x-ray on September 21 that showed patchy bilateral     airspace opacities, question edema versus infection.  A CT     angiogram of the chest on September 22 that was negative for PE.     Diffuse bilateral airspace disease, suggestive of pulmonary edema.     A small pericardial effusion, unchanged from  prior study. 2. 2-D echocardiogram on September 23 that showed an ejection fraction     of 55%-60% with no wall motion abnormalities with a moderate     pericardial effusion.  HISTORY AND PHYSICAL:  For full details, please see dictation on September 21 by Dr. Arnoldo Morale.  However in brief, [name] is a pleasant, obese, 60 year old white woman, who came into the hospital with a history of increasing shortness of breath and edema of the lower extremities.  She reported going to primary care physician's office several times for this problem.  She was thought to be in congestive heart failure on admission and we are asked to admit her for further evaluation and management.  HOSPITAL COURSE BY PROBLEM: 1. Acute respiratory failure.  Upon admission, she was found to be     hypoxic and was requiring oxygen.  This has improved with diuresis     as well as antibiotics for her presumed community-acquired     pneumonia.  At time of discharge, she has two more days left of her     antibiotics.  Her Lasix has been discontinued given her acute renal     failure.  She does not appear to be markedly volume overloaded  at     this time.  Dr. Percival Spanish agrees with cessation of diuretics for the     moment. 2. Acute renal failure secondary to diuresis plus possible contrast-     induced nephropathy from her CT angiogram of the chest.  Creatinine     at day of discharge is 1.7.  The patient is otherwise doing well     and I see no reason to keep her in the hospital on the account of     an increased creatinine.  I have recommended that she see her     primary care physician in 4 days to have a BMET rechecked to follow     up on her renal function.  The patient voices understanding.  She     has been asked to totally discontinue diuresis at this time. 3. Type 2 diabetes.  She was initially placed on her home dose of     insulin.  However, she has continued to be hyperglycemic, so her     Lantus has been  increased from 85 twice a day to 95 twice a day. 4. Her chronic medical conditions are stable. 5. Vitals on day of discharge, blood pressure 95/56, heart rate 81,     respirations 20, temperature of 97.9, and sats 97% on room air.     Domingo Mend, M.D.     EH/MEDQ  D:  05/11/2011  T:  05/11/2011  Job:  [id number]  cc:   Chipper Herb, M.D. Minus Breeding, MD, [facility]  Electronically Signed by Domingo Mend M.D. on 05/22/2011 08:00:07 AM

## 2011-06-02 ENCOUNTER — Encounter: Payer: Self-pay | Admitting: Cardiology

## 2011-06-02 ENCOUNTER — Ambulatory Visit (INDEPENDENT_AMBULATORY_CARE_PROVIDER_SITE_OTHER): Payer: BC Managed Care – PPO | Admitting: Cardiology

## 2011-06-02 VITALS — BP 140/70 | HR 106 | Ht 67.0 in | Wt 240.0 lb

## 2011-06-02 DIAGNOSIS — I313 Pericardial effusion (noninflammatory): Secondary | ICD-10-CM

## 2011-06-02 DIAGNOSIS — M7989 Other specified soft tissue disorders: Secondary | ICD-10-CM

## 2011-06-02 DIAGNOSIS — I319 Disease of pericardium, unspecified: Secondary | ICD-10-CM

## 2011-06-02 DIAGNOSIS — I10 Essential (primary) hypertension: Secondary | ICD-10-CM

## 2011-06-02 DIAGNOSIS — I2510 Atherosclerotic heart disease of native coronary artery without angina pectoris: Secondary | ICD-10-CM

## 2011-06-02 NOTE — Patient Instructions (Signed)
Your physician has requested that you have an echocardiogram. Echocardiography is a painless test that uses sound waves to create images of your heart. It provides your doctor with information about the size and shape of your heart and how well your heart's chambers and valves are working. This procedure takes approximately one hour. There are no restrictions for this procedure.   Follow up with Dr Percival Spanish in 2 months in the [place] office.

## 2011-06-02 NOTE — Assessment & Plan Note (Signed)
She had nonobstructive disease in 2006.  She will continue with risk reduction.

## 2011-06-02 NOTE — Assessment & Plan Note (Signed)
I will support conservative measures without aggressive diuresis given her CKD.  No change in therapy is suggested.

## 2011-06-02 NOTE — Assessment & Plan Note (Signed)
This has been a chronic problem.  I will follow up with an echo in about two weeks to make sure it has not increased in size.  For now she will continue the meds as listed.

## 2011-06-02 NOTE — Progress Notes (Signed)
HPI The patient presents for followup after recent hospitalization. She was admitted with a probable pneumonia. She was also thought to have some diastolic dysfunction. She was diuresed but developed some renal insufficiency. An echocardiogram didn't demonstrate a moderate pericardial effusion. She had had an acute in the past. This did not appear to be hemodynamically significant. Since going home she has continued to have some congested or sense of some slight pulmonary process but her breathing is improved. She still gets short of breath with activities. She is limited by back pain. She is not describing any new PND or orthopnea. She is not describing any new palpitations, presyncope or syncope. She continues to have some lower extremity swelling. She reports that her creatinine was back to baseline and followed by Dr. Laurance Flatten. She has had no fevers or chills.  Allergies  Allergen Reactions  . Levemir (Insulin Detemir)   . Penicillins   . Vytorin     Current Outpatient Prescriptions  Medication Sig Dispense Refill  . aspirin 325 MG tablet Take 325 mg by mouth daily.        Marland Kitchen esomeprazole (NEXIUM) 40 MG capsule Take 40 mg by mouth daily.        . fish oil-omega-3 fatty acids 1000 MG capsule Take 2 g by mouth daily.        . insulin aspart protamine-insulin aspart (NOVOLOG 70/30) (70-30) 100 UNIT/ML injection Inject into the skin.        Marland Kitchen insulin glargine (LANTUS) 100 UNIT/ML injection Inject into the skin at bedtime.        . metoprolol (LOPRESSOR) 50 MG tablet Take 50 mg by mouth 2 (two) times daily.        . Olmesartan-Amlodipine-HCTZ (TRIBENZOR) 40-5-12.5 MG TABS Take 1 tablet by mouth daily.        . Pitavastatin Calcium (LIVALO PO) Take by mouth.          Past Medical History  Diagnosis Date  . 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 Hypertension   . Hyperlipidemia   . Cystocele   . IBS (irritable bowel syndrome)   . Varicose veins   . Superficial phlebitis   .  Esophageal stricture   . Fatty liver   . Osteopenia   . Vitamin D deficiency   . Effusion, pericardium   . CAD (coronary artery disease)     Nonobstructive  . Dyslipidemia   . GERD (gastroesophageal reflux disease)   . Osteoarthritis     Past Surgical History  Procedure Date  . Lerft cataract 2003    Dr. Ishmael Holter   . Tonsillectomy 1968  . Cholecystectomy 1982  . Carpal tunnel release     Left    ROS:  As stated in the HPI and negative for all other systems.  PHYSICAL EXAM BP 140/70  Pulse 106  Ht 5\' 7"  (1.702 m)  Wt 240 lb (108.863 kg)  BMI 37.59 kg/m2 GENERAL:  Well appearing HEENT:  Pupils equal round and reactive, fundi not visualized, oral mucosa unremarkable NECK:  No jugular venous distention, waveform within normal limits, carotid upstroke brisk and symmetric, no bruits, no thyromegaly LYMPHATICS:  No cervical, inguinal adenopathy LUNGS:  Clear to auscultation bilaterally BACK:  No CVA tenderness CHEST:  Unremarkable HEART:  PMI not displaced or sustained,S1 and S2 within normal limits, no S3, no S4, no clicks, no rubs, no murmurs ABD:  Flat, positive bowel sounds normal in frequency in pitch, no bruits,  no rebound, no guarding, no midline pulsatile mass, no hepatomegaly, no splenomegaly, mild EXT:  2 plus pulses throughout, positive mild edema, no cyanosis no clubbing SKIN:  No rashes no nodules NEURO:  Cranial nerves II through XII grossly intact, motor grossly intact throughout [facility]:  Cognitively intact, oriented to person place and time;xam  EKG:  Sinus tachycardia, rate 107, axis within normal limits, intervals within normal limits, low voltage, poor anterior R-wave progression, no acute ST-T wave changes, no change from previous.  ASSESSMENT AND PLAN

## 2011-06-02 NOTE — Assessment & Plan Note (Signed)
Her blood pressure is upper limites of acceptable.  She will continue the meds as listed.

## 2011-06-14 ENCOUNTER — Encounter: Payer: Self-pay | Admitting: *Deleted

## 2011-06-16 ENCOUNTER — Ambulatory Visit (HOSPITAL_COMMUNITY): Payer: BC Managed Care – PPO | Attending: Cardiology | Admitting: Radiology

## 2011-06-16 DIAGNOSIS — I10 Essential (primary) hypertension: Secondary | ICD-10-CM | POA: Insufficient documentation

## 2011-06-16 DIAGNOSIS — I059 Rheumatic mitral valve disease, unspecified: Secondary | ICD-10-CM | POA: Insufficient documentation

## 2011-06-16 DIAGNOSIS — E785 Hyperlipidemia, unspecified: Secondary | ICD-10-CM | POA: Insufficient documentation

## 2011-06-16 DIAGNOSIS — I313 Pericardial effusion (noninflammatory): Secondary | ICD-10-CM

## 2011-06-16 DIAGNOSIS — I319 Disease of pericardium, unspecified: Secondary | ICD-10-CM | POA: Insufficient documentation

## 2011-06-16 DIAGNOSIS — E119 Type 2 diabetes mellitus without complications: Secondary | ICD-10-CM | POA: Insufficient documentation

## 2011-06-19 NOTE — Consult Note (Signed)
NAMELISANNE, COBB NO.:  [id number]  MEDICAL RECORD NO.:  [id number]  LOCATION:                                 FACILITY:  PHYSICIAN:  Fay Records, MD, FACCDATE OF BIRTH:  15-Jul-1951  DATE OF CONSULTATION:  05/12/2011 DATE OF DISCHARGE:                                CONSULTATION   IDENTIFICATION:  The patient is a 60 year old woman we are asked to see regarding shortness of breath, cough, question CHF.  HISTORY OF PRESENT ILLNESS:  The patient reports in July she felt okay, in August, she became short of breath with cough.  She was treated with a Z-Pak, got better but not completely resolved, still some wheezing, she noted no gurgling. Two weeks prior to admission, she was seen by Dr. Laurance Flatten.  She was treated with Mucinex and then a humidifier.  She began coughing more.  Over the last couple days, she has noticed cramping in her legs and feet.  She was seen in clinic, her O2 saturations were in the 70% with walking, 80s at rest.  The patient feels that the Tribenzor which is new, but had been on the list in the past, was exacerbating the above.  Note, the patient had a Myoview in February 2012 that showed ischemia.  The patient notes that her weight initially was up but now it is down.  ALLERGIES:  LEVEMIR AND PENICILLIN.  MEDICATIONS:  Aspirin, Lantus, metformin, metoprolol, Nexium, NovoLog, and Tribenzor.  PAST MEDICAL HISTORY: 1. Hypertension. 2. Diabetes. 3. History of spinal stenosis. 4. History of GE reflux.  PAST SURGICAL HISTORY:  Cholecystectomy, tonsillectomy, eye surgery, carpal tunnel.  Cardiac catheterizations in 2006 showed 25% irregularities of the LAD, left circumflex, and normal RCA irregularities, LVEF of 65%.  SOCIAL HISTORY:  The patient does not smoke, does not drink.  FAMILY HISTORY:  Noncontributory.  REVIEW OF SYSTEMS:  Weight currently is 254, her dry weight is 245.  No fevers, chills, or sweats.  Nonproductive  cough as noted above. Shortness of breath.  Dyspnea.  No chest pain.  Sleeps 2 hours at a time.  Drinks 3-4 glasses of water per day, chronic 2 pillow orthopnea. No nausea, vomiting, or diarrhea.  One bright red blood per rectum yesterday; otherwise, all systems reviewed negative to the above problem except as noted above.  PHYSICAL EXAM:  GENERAL:  The patient is in mild distress secondary to cough and shortness of breath. VITAL SIGNS:  Blood pressure is 140 to 160 over 60 to 100, respiratory rate 15 to 25, pulse is 115, temperature is 98.2, O2 sat on 2 L is 96%. HEENT:  Normocephalic, atraumatic, EOMI, PERL. NECK:  JVP is slightly elevated.  No bruits. LUNGS:  No rales.  No wheezing.  Moving air, upper airway slightly tight. CARDIAC:  Regular rate and rhythm, S1, S2.  No S3, S4, or murmurs. ABDOMEN:  Obese, supple.  No right upper quadrant tenderness.  No masses. EXTREMITIES:  Trace to 1+ lower extremity edema. NEUROLOGIC:  Alert and oriented x3.  Cranial nerves II through XII grossly intact.  IMAGING:  Chest x-ray shows cardiomegaly, diffuse patchy airspace disease.  Twelve-lead EKG is pending.  LABS:  Hemoglobin of 12.7, BUN and creatinine of 22 and 1.  WBC of 9.9, glucose of 320, BNP of 67, point-of-care troponin 0.01.  Specific gravity of urine is 1.007.  IMPRESSION:  The patient is a 60 year old woman with mild coronary artery disease by cath in 2006 and a normal Myoview earlier this year. She presents with 1-1/38-month history of shortness of breath and cough. She was treated for a lung infection, did not get better.  She questions if there was a problem with Tribenzor,  the record suggests she had been on this for a while.  She presented today with hypoxia to the emergency room.  On exam, she is in mild distress with her sats in the 90s on 2 L. Blood pressure 140s-160.  She is moving air.  There are no rales.  She has 1+ edema.  Chest x-ray with patchy airspace disease,  there is question congestive heart failure versus infiltrate.  Her BNP level is 67. 1. I am not convinced all of the above represents CHF.  I would treat     both for volume overload and atypical pneumonia, check D-dimer, IV     Lasix, use ARB, discontinue Norvasc. 2. CAD, I am not convinced of an acute ischemic event, would get echo     to reconfirm left ventricular function. 3. Dyslipidemia, would maintain on Crestor, resume. 4. Diabetes, reserved for primary team. 5. Gastroesophageal reflux, treat with Protonix b.i.d.  We will     continue to follow.     Fay Records, MD, [facility]     PVR/MEDQ  D:  06/18/2011  T:  06/18/2011  Job:  [id number]

## 2011-06-21 ENCOUNTER — Encounter: Payer: Self-pay | Admitting: Gastroenterology

## 2011-07-27 ENCOUNTER — Ambulatory Visit (INDEPENDENT_AMBULATORY_CARE_PROVIDER_SITE_OTHER): Payer: BC Managed Care – PPO | Admitting: Cardiology

## 2011-07-27 ENCOUNTER — Encounter: Payer: Self-pay | Admitting: Cardiology

## 2011-07-27 ENCOUNTER — Ambulatory Visit: Payer: BC Managed Care – PPO | Admitting: Cardiology

## 2011-07-27 DIAGNOSIS — R0602 Shortness of breath: Secondary | ICD-10-CM

## 2011-07-27 DIAGNOSIS — E669 Obesity, unspecified: Secondary | ICD-10-CM

## 2011-07-27 DIAGNOSIS — I319 Disease of pericardium, unspecified: Secondary | ICD-10-CM

## 2011-07-27 DIAGNOSIS — I10 Essential (primary) hypertension: Secondary | ICD-10-CM

## 2011-07-27 NOTE — Assessment & Plan Note (Signed)
She has no new dyspnea. She thinks she has recovered for the most part from her most recent ammonia. She probably did have a chest x-ray this week but I don't have those results. I will defer followup to her primary provider.

## 2011-07-27 NOTE — Assessment & Plan Note (Signed)
This was mild on the last echo. At this point no further cardiovascular testing is suggested. I will follow this up with repeat exams and echoes in the future.

## 2011-07-27 NOTE — Assessment & Plan Note (Signed)
We did discuss the possibility of bariatric surgery. I think it would be reasonable for her to consider this. She apparently has taken one class and she thinks she will pursue this to

## 2011-07-27 NOTE — Progress Notes (Signed)
HPI The patient presents for followup after recent hospitalization. She was admitted with a probable pneumonia. She was also thought to have some diastolic dysfunction. She was diuresed but developed some renal insufficiency. An echocardiogram didn't demonstrate a moderate pericardial effusion. Following the last visit I did order an echocardiogram which demonstrated a pericardial effusion to be small.  She has been on her treadmill since I last saw her.  She has continued to have some mild ankle edema.  However, she's not having any of the shortness of breath she was having previously. She's not describing any new PND or orthopnea. She's had no palpitations, presyncope or syncope. She's had no chest pressure, neck or arm discomfort.  Allergies  Allergen Reactions  . Levemir (Insulin Detemir)   . Penicillins   . Vytorin     Current Outpatient Prescriptions  Medication Sig Dispense Refill  . aspirin 325 MG tablet Take 325 mg by mouth daily.        Marland Kitchen esomeprazole (NEXIUM) 40 MG capsule Take 40 mg by mouth daily.        . fish oil-omega-3 fatty acids 1000 MG capsule Take 2 g by mouth daily.        . insulin aspart protamine-insulin aspart (NOVOLOG 70/30) (70-30) 100 UNIT/ML injection Inject into the skin.        Marland Kitchen insulin glargine (LANTUS) 100 UNIT/ML injection Inject into the skin at bedtime.        . metFORMIN (GLUCOPHAGE) 1000 MG tablet Take 500 mg by mouth daily with breakfast.        . metoprolol (LOPRESSOR) 50 MG tablet Take 50 mg by mouth 2 (two) times daily.        . Olmesartan-Amlodipine-HCTZ (TRIBENZOR) 40-5-12.5 MG TABS Take 1 tablet by mouth daily.        . Pitavastatin Calcium (LIVALO PO) Take by mouth.          Past Medical History  Diagnosis Date  . 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 Hypertension   . Hyperlipidemia   . Cystocele   . IBS (irritable bowel syndrome)   . Varicose veins   . Superficial phlebitis   . Esophageal stricture   . Fatty  liver   . Osteopenia   . Vitamin D deficiency   . Effusion, pericardium   . CAD (coronary artery disease)     Nonobstructive  . Dyslipidemia   . GERD (gastroesophageal reflux disease)   . Osteoarthritis     Past Surgical History  Procedure Date  . Lerft cataract 2003    Dr. Ishmael Holter   . Tonsillectomy 1968  . Cholecystectomy 1982  . Carpal tunnel release     Left    ROS:  As stated in the HPI and negative for all other systems.  PHYSICAL EXAM BP 148/78  Pulse 102  Resp 18  Ht 5\' 7"  (1.702 m)  Wt 250 lb (113.399 kg)  BMI 39.16 kg/m2 GENERAL:  Well appearing HEENT:  Pupils equal round and reactive, fundi not visualized, oral mucosa unremarkable NECK:  No jugular venous distention, waveform within normal limits, carotid upstroke brisk and symmetric, no bruits, positive transmitted systolic murmur no thyromegaly LYMPHATICS:  No cervical, inguinal adenopathy LUNGS:  Clear to auscultation bilaterally BACK:  No CVA tenderness CHEST:  Unremarkable HEART:  PMI not displaced or sustained,S1 and S2 within normal limits, no S3, no S4, no clicks, no rubs, apical early peaking systolic murmur ABD:  Flat,  positive bowel sounds normal in frequency in pitch, no bruits, no rebound, no guarding, no midline pulsatile mass, no hepatomegaly, no splenomegaly, obese EXT:  2 plus pulses throughout, positive mild edema, no cyanosis no clubbing SKIN:  No rashes no nodules NEURO:  Cranial nerves II through XII grossly intact, motor grossly intact throughout PSYCH:  Cognitively intact, oriented to person place and time  ASSESSMENT AND PLAN

## 2011-07-27 NOTE — Patient Instructions (Signed)
The current medical regimen is effective;  continue present plan and medications.  Follow up in 6 months with Dr Hochrein.  You will receive a letter in the mail 2 months before you are due.  Please call us when you receive this letter to schedule your follow up appointment.  

## 2011-07-27 NOTE — Assessment & Plan Note (Signed)
Her blood pressure is controlled and she will continue the meds as listed

## 2011-07-28 ENCOUNTER — Other Ambulatory Visit: Payer: Self-pay | Admitting: Family Medicine

## 2011-08-01 ENCOUNTER — Ambulatory Visit
Admission: RE | Admit: 2011-08-01 | Discharge: 2011-08-01 | Disposition: A | Discharge status: Home / Self Care | Payer: BC Managed Care – PPO | Source: Ambulatory Visit | Attending: Family Medicine | Admitting: Family Medicine

## 2011-09-20 ENCOUNTER — Encounter: Payer: Self-pay | Admitting: Internal Medicine

## 2011-09-20 ENCOUNTER — Ambulatory Visit (INDEPENDENT_AMBULATORY_CARE_PROVIDER_SITE_OTHER): Payer: BC Managed Care – PPO | Admitting: Internal Medicine

## 2011-09-20 DIAGNOSIS — R0602 Shortness of breath: Secondary | ICD-10-CM

## 2011-09-20 NOTE — Assessment & Plan Note (Addendum)
-   2d Echo  06/16/11 Normal LV size with mild LV hypertrophy. EF 60-65%. Moderate diastolic dysfunction. Normal RV size and systolic function.  Morbid obesity appears to be the main issue here but also places her at risk of GERD/LPR and of DVT/PE with the chronicity and the am "throat congestion strongly suggestive of the former.  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   These are the same pts who not infrequently have failed to tolerate ace inhibitors,  dry powder inhalers or biphosphonates or report having reflux symptoms that don't respond to standard doses of PPI , and are easily confused as having aecopd or asthma flares, which she was previously thought to have by a pulmonologist.  Therefore needs trial of aggressive gerd rx/diet and return for pft's, walking sats in 4-6 weeks, sooner if sudden change for the worse occurs which would be worrisome for pe noting that her baseline echo and bnp look good w/in the time frame she's developed sob.

## 2011-09-20 NOTE — Progress Notes (Signed)
  Subjective:    Patient ID: [name], female    DOB: 04/17/1951   MRN: [id number]  HPI  81 yowf never smoker with sob off and on since 2010 referred to pulmonary clinic 09/20/2011 by Dr Morrie Sheldon.  09/20/2011 1st pulmonary eval previously felt to have asthma by [facility] and variably sob x years assoc with episodes with "bronchitis" but in between does ok but indolent onset minimally progressive doe on a typical good day can't do a whole set of steps since 6-12 months s stopping and across parking lot into stores gets sob and weak about the same time no better with inhaler.  Wakes up every am with throat congestion > white mucus x tsp  Takes nexium bedtime no better.   Sleeping ok without nocturnal  or early am exacerbation  of respiratory  c/o's or need for noct saba. Also denies any obvious fluctuation of symptoms with weather or environmental changes or other aggravating or alleviating factors except as outlined above   Review of Systems  Constitutional: Positive for unexpected weight change. Negative for fever.  HENT: Positive for congestion and sneezing. Negative for ear pain, nosebleeds, sore throat, rhinorrhea, trouble swallowing, dental problem, postnasal drip and sinus pressure.   Eyes: Negative for redness and itching.  Respiratory: Positive for shortness of breath. Negative for cough, chest tightness and wheezing.   Cardiovascular: Positive for chest pain and leg swelling. Negative for palpitations.  Gastrointestinal: Negative for nausea and vomiting.  Genitourinary: Negative for dysuria.  Musculoskeletal: Negative for joint swelling.  Skin: Negative for rash.  Neurological: Negative for headaches.  Hematological: Does not bruise/bleed easily.  Psychiatric/Behavioral: Negative for dysphoric mood. The patient is not nervous/anxious.        Objective:   Physical Exam  Wt 256 09/20/2011   Obese wf with pear shaped body habitus with ? Pseudo cushing's pattern  HEENT: nl  dentition, turbinates, and orophanx. Nl external ear canals without cough reflex   NECK :  without JVD/Nodes/TM/ nl carotid upstrokes bilaterally   LUNGS: no acc muscle use, clear to A and P bilaterally without cough on insp or exp maneuvers   CV:  RRR  no s3 or murmur or increase in P2, no edema   ABD:  soft and nontender with nl excursion in the supine position. No bruits or organomegaly, bowel sounds nl  MS:  warm without deformities, calf tenderness, cyanosis or clubbing  SKIN: warm and dry without lesions    NEURO:  alert, approp, no deficits  Labs 09/15/11  HC03 28 BNP 14 cxr mild interstitial prominence        Assessment & Plan:

## 2011-09-20 NOTE — Patient Instructions (Signed)
Stop fish oil  Try Nexium 40 mg   Take 30-60 min before first meal of the day and Pepcid 20 mg one bedtime   GERD (REFLUX)  is an extremely common cause of respiratory symptoms, many times with no significant heartburn at all.    It can be treated with medication, but also with lifestyle changes including avoidance of late meals, excessive alcohol, smoking cessation, and avoid fatty foods, chocolate, peppermint, colas, red wine, and acidic juices such as orange juice.  NO MINT OR MENTHOL PRODUCTS SO NO COUGH DROPS  USE SUGARLESS CANDY INSTEAD (jolley ranchers or Stover's)  NO OIL BASED VITAMINS - use powdered substitutes.    Please schedule a follow up office visit in 6 weeks, call sooner if needed with PFT's

## 2011-10-05 ENCOUNTER — Telehealth: Payer: Self-pay | Admitting: Internal Medicine

## 2011-10-05 NOTE — Telephone Encounter (Signed)
LMOM informing pt paperwork she requested has been mailed to her home address.

## 2011-11-01 ENCOUNTER — Encounter: Payer: Self-pay | Admitting: Internal Medicine

## 2011-11-01 ENCOUNTER — Ambulatory Visit (INDEPENDENT_AMBULATORY_CARE_PROVIDER_SITE_OTHER): Payer: PRIVATE HEALTH INSURANCE | Admitting: Internal Medicine

## 2011-11-01 VITALS — BP 160/80 | HR 103 | Temp 98.0°F | Ht 68.0 in | Wt 256.0 lb

## 2011-11-01 DIAGNOSIS — R0602 Shortness of breath: Secondary | ICD-10-CM

## 2011-11-01 DIAGNOSIS — E669 Obesity, unspecified: Secondary | ICD-10-CM

## 2011-11-01 DIAGNOSIS — I10 Essential (primary) hypertension: Secondary | ICD-10-CM

## 2011-11-01 LAB — PULMONARY FUNCTION TEST

## 2011-11-01 MED ORDER — OLMESARTAN MEDOXOMIL-HCTZ 40-25 MG PO TABS: 1.0000 | ORAL_TABLET | Freq: Every day | ORAL | Status: DC

## 2011-11-01 NOTE — Assessment & Plan Note (Addendum)
-   CTangiogram 05/07/11> neg pe/ pos edema   - 2d Echo  06/16/11 Normal LV size with mild LV hypertrophy. EF 60-65%. Moderate diastolic dysfunction. Normal RV size and systolic function. - PFT's 11/01/2011 > FEV1 1.83 (70%) ratio 84 and no change p B2,  DLCO 65 corrects to 149 and ERV 19%  - 11/01/2011  Walked RA x 3 laps @ 185 ft each stopped due to  End of study, 91% ? Dropped in middle and back up c/w technical difficulty  I had an extended discussion with the patient today lasting 15 to 20 minutes of a 25 minute visit on the following issues:   Reviewed w/u to date including CT chest as above > everything points to obesity with reversible component due to diastolic chf and also at risk of GERD and DVT/PE esp in future if not loosing wt

## 2011-11-01 NOTE — Patient Instructions (Addendum)
GERD (REFLUX)  is an extremely common cause of respiratory symptoms (just like yours), many times with no significant heartburn at all.    It can be treated with medication, but also with lifestyle changes including avoidance of late meals, excessive alcohol, smoking cessation, and avoid fatty foods, chocolate, peppermint, colas, red wine, and acidic juices such as orange juice.  NO MINT OR MENTHOL PRODUCTS SO NO COUGH DROPS  USE SUGARLESS CANDY INSTEAD (jolley ranchers or Stover's)  NO OIL BASED VITAMINS - use powdered substitutes.  Try Nexium 40 mg Take 30-60 min before first meal of the day and Pepcid 20 mg one bedtime  Start Benicar hct 40 /25 one half each am - the swelling you are experiencing is likely coming from the amlodipine in tribenzor  Please schedule a follow up visit in 2 months but call sooner if needed - [facility]

## 2011-11-01 NOTE — Progress Notes (Signed)
  Subjective:    Patient ID: [name], female    DOB: 1951-07-16   MRN: [id number]   Brief patient profile:   16 yowf never smoker with sob off and on since 2010 referred to pulmonary clinic 09/20/2011 by Dr Morrie Sheldon with PFT's 10/2011 classic restrictive/obesity effects   HPI 09/20/2011 1st pulmonary eval previously felt to have asthma by [facility] and variably sob x years assoc with episodes with "bronchitis" but in between does ok but indolent onset minimally progressive doe on a typical good day can't do a whole set of steps since 6-12 months s stopping and across parking lot into stores gets sob and weak about the same time no better with inhaler.  Wakes up every am with throat congestion > white mucus x tsp  Takes nexium bedtime no better.  rec Stop fish oil Try Nexium 40 mg   Take 30-60 min before first meal of the day and Pepcid 20 mg one bedtime  GERD diet reviwed  11/01/2011 f/u ov/[name] cc cough some better, still sob with more than room to room walking. No overt sinus or hb symptoms- has leg swelling ? From amlodipine and extremely concerned re package insert info.  Sleeping ok without nocturnal  or early am exacerbation  of respiratory  c/o's or need for noct saba. Also denies any obvious fluctuation of symptoms with weather or environmental changes or other aggravating or alleviating factors except as outlined above   ROS  At present neg for  any significant sore throat, dysphagia, itching, sneezing,  nasal congestion or excess/ purulent secretions,  fever, chills, sweats, unintended wt loss, pleuritic or exertional cp, hempoptysis, orthopnea pnd .  Also denies presyncope, palpitations, heartburn, abdominal pain, nausea, vomiting, diarrhea  or change in bowel or urinary habits, dysuria,hematuria,  rash, arthralgias, visual complaints, headache, numbness weakness or ataxia.     .        Objective:   Physical Exam  Wt 256 09/20/2011  >  11/01/2011 256  Obese wf with pear  shaped body habitus with ? Pseudo cushing's pattern  HEENT: nl dentition, turbinates, and orophanx. Nl external ear canals without cough reflex   NECK :  without JVD/Nodes/TM/ nl carotid upstrokes bilaterally   LUNGS: no acc muscle use, clear to A and P bilaterally without cough on insp or exp maneuvers   CV:  RRR  no s3 or murmur or increase in P2, no edema   ABD:  soft and nontender with nl excursion in the supine position. No bruits or organomegaly, bowel sounds nl  MS:  warm without deformities, calf tenderness, cyanosis or clubbing    Labs 09/15/11  HC03 28 BNP 14 cxr mild interstitial prominence        Assessment & Plan:

## 2011-11-01 NOTE — Progress Notes (Signed)
PFT done today. 

## 2011-11-02 NOTE — Assessment & Plan Note (Signed)
?   Intol of amlodipine > try benicar and f/u Dr Laurance Flatten but avoid acei in this setting so as not to muddy the waters  Does not appear to have asthma though we have not specifically ruled it out. Strongly prefer in this setting: Bystolic, the most beta -1  selective Beta blocker available in sample form, with bisoprolol the most selective generic choice  on the market. But for now ok to continue lopressor

## 2011-11-02 NOTE — Assessment & Plan Note (Signed)
Calorie balance issues discussed, would consider nutrition consult if not already done.

## 2011-11-10 ENCOUNTER — Encounter: Payer: Self-pay | Admitting: Internal Medicine

## 2011-11-15 ENCOUNTER — Other Ambulatory Visit: Payer: Self-pay | Admitting: Family Medicine

## 2011-11-17 ENCOUNTER — Ambulatory Visit (HOSPITAL_COMMUNITY)
Admission: RE | Admit: 2011-11-17 | Discharge: 2011-11-17 | Disposition: A | Discharge status: Home / Self Care | Payer: PRIVATE HEALTH INSURANCE | Source: Ambulatory Visit | Attending: Family Medicine | Admitting: Family Medicine

## 2011-11-17 DIAGNOSIS — R109 Unspecified abdominal pain: Secondary | ICD-10-CM | POA: Insufficient documentation

## 2011-11-17 DIAGNOSIS — I10 Essential (primary) hypertension: Secondary | ICD-10-CM | POA: Insufficient documentation

## 2011-11-17 DIAGNOSIS — E119 Type 2 diabetes mellitus without complications: Secondary | ICD-10-CM | POA: Insufficient documentation

## 2011-11-17 DIAGNOSIS — M47817 Spondylosis without myelopathy or radiculopathy, lumbosacral region: Secondary | ICD-10-CM | POA: Insufficient documentation

## 2011-11-25 ENCOUNTER — Encounter: Payer: Self-pay | Admitting: Gastroenterology

## 2011-12-19 ENCOUNTER — Other Ambulatory Visit (INDEPENDENT_AMBULATORY_CARE_PROVIDER_SITE_OTHER): Payer: PRIVATE HEALTH INSURANCE

## 2011-12-19 ENCOUNTER — Encounter: Payer: Self-pay | Admitting: Gastroenterology

## 2011-12-19 ENCOUNTER — Ambulatory Visit (INDEPENDENT_AMBULATORY_CARE_PROVIDER_SITE_OTHER): Payer: PRIVATE HEALTH INSURANCE | Admitting: Gastroenterology

## 2011-12-19 VITALS — BP 168/72 | HR 84 | Ht 67.0 in | Wt 253.8 lb

## 2011-12-19 DIAGNOSIS — R933 Abnormal findings on diagnostic imaging of other parts of digestive tract: Secondary | ICD-10-CM

## 2011-12-19 DIAGNOSIS — R109 Unspecified abdominal pain: Secondary | ICD-10-CM

## 2011-12-19 LAB — CBC WITH DIFFERENTIAL/PLATELET
Basophils Absolute: 0.1 10*3/uL (ref 0.0–0.1)
Basophils Relative: 0.7 % (ref 0.0–3.0)
Eosinophils Absolute: 0.6 10*3/uL (ref 0.0–0.7)
Eosinophils Relative: 5.9 % — ABNORMAL HIGH (ref 0.0–5.0)
HCT: 43.6 % (ref 36.0–46.0)
Hemoglobin: 14.4 g/dL (ref 12.0–15.0)
Lymphocytes Relative: 27.6 % (ref 12.0–46.0)
Lymphs Abs: 2.7 10*3/uL (ref 0.7–4.0)
MCHC: 33.1 g/dL (ref 30.0–36.0)
MCV: 83.2 fl (ref 78.0–100.0)
Monocytes Absolute: 0.7 10*3/uL (ref 0.1–1.0)
Monocytes Relative: 6.9 % (ref 3.0–12.0)
Neutro Abs: 5.7 10*3/uL (ref 1.4–7.7)
Neutrophils Relative %: 58.9 % (ref 43.0–77.0)
Platelets: 200 10*3/uL (ref 150.0–400.0)
RBC: 5.24 Mil/uL — ABNORMAL HIGH (ref 3.87–5.11)
RDW: 14.9 % — ABNORMAL HIGH (ref 11.5–14.6)
WBC: 9.6 10*3/uL (ref 4.5–10.5)

## 2011-12-19 LAB — COMPREHENSIVE METABOLIC PANEL
ALT: 30 U/L (ref 0–35)
AST: 22 U/L (ref 0–37)
Albumin: 3.5 g/dL (ref 3.5–5.2)
Alkaline Phosphatase: 75 U/L (ref 39–117)
BUN: 26 mg/dL — ABNORMAL HIGH (ref 6–23)
CO2: 28 mEq/L (ref 19–32)
Calcium: 9 mg/dL (ref 8.4–10.5)
Chloride: 106 mEq/L (ref 96–112)
Creatinine, Ser: 1.1 mg/dL (ref 0.4–1.2)
GFR: 55.42 mL/min — ABNORMAL LOW (ref >60.00)
Glucose, Bld: 286 mg/dL — ABNORMAL HIGH (ref 70–99)
Potassium: 4.7 mEq/L (ref 3.5–5.1)
Sodium: 144 mEq/L (ref 135–145)
Total Bilirubin: 0.6 mg/dL (ref 0.3–1.2)
Total Protein: 6.9 g/dL (ref 6.0–8.3)

## 2011-12-19 LAB — LIPASE: Lipase: 42 U/L (ref 11.0–59.0)

## 2011-12-19 MED ORDER — HYOSCYAMINE SULFATE 0.125 MG SL SUBL: 0.1250 mg | SUBLINGUAL_TABLET | SUBLINGUAL | Status: DC | PRN

## 2011-12-19 NOTE — Patient Instructions (Addendum)
Your physician has requested that you go to the basement for the following lab work before leaving today: CBC, Cmet, Lipase.  Patient advised to avoid spicy, acidic, citrus, chocolate, mints, fruit and fruit juices.  Limit the intake of caffeine, alcohol and Soda.  Don't exercise too soon after eating.  Don't lie down within 3-4 hours of eating.  Elevate the head of your bed.  You have been scheduled for a CT scan of the abdomen and pelvis at [facility] ([address]---this is in the same building as [facility]).   You are scheduled on 12/21/11 at 1:00pm.  Please follow the written instructions below on the day of your exam:  WARNING: IF YOU ARE ALLERGIC TO IODINE/X-RAY DYE, PLEASE NOTIFY RADIOLOGY IMMEDIATELY AT [phone]! YOU WILL BE GIVEN A 13 HOUR PREMEDICATION PREP.  1) Do not eat or drink anything after 10:00am (4 hours prior to your test)   You may take any medications as prescribed with a small amount of water except for the following: Metformin, Glucophage, Glucovance, Avandamet, Riomet, Fortamet, Actoplus Met, Janumet, Glumetza or Metaglip. The above medications must be held the day of the exam AND 48 hours after the exam.  The purpose of you drinking the oral contrast is to aid in the visualization of your intestinal tract. The contrast solution may cause some diarrhea. Before your exam is started, you will be given a small amount of fluid to drink. Depending on your individual set of symptoms, you may also receive an intravenous injection of x-ray contrast/dye. Plan on being at [facility] for 30 minutes or long, depending on the type of exam you are having performed.  If you have any questions regarding your exam or if you need to reschedule, you may call the CT department at [phone] between the hours of 8:00 am and 5:00 pm, Monday-Friday.  ________________________________________________________________________  cc: Redge Gainer, MD

## 2011-12-19 NOTE — Progress Notes (Signed)
History of Present Illness: This is a 61 year old female referred for evaluation of abnormal abdominal CT scan. She has had chronic right-sided abdominal pain her over 40 years. Occasionally these symptoms worsen and spread across her upper abdomen. This is often associated with gas and bloating. Her symptoms worsen about once every year. She has chronic constipation alternating with normal stools. She relates a history of irritable bowel syndrome with diarrhea diagnosed many years ago. She has ongoing throat clearing and cough was so to possibly have GERD and Pepcid was added at night to her chronic regimen of daily Nexium. She underwent colonoscopy in 2005 it was normal. 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 Denies weight loss, abdominal pain, constipation, diarrhea, change in stool caliber, melena, hematochezia, nausea, vomiting, dysphagia, reflux symptoms, chest pain.  Review of Systems: Pertinent positive and negative review of systems were noted in the above HPI section. All other review of systems were otherwise negative.  Current Medications, Allergies, Past Medical History, Past Surgical History, Family History and Social History were reviewed in [employer] record.  Physical Exam: General: Well developed , well nourished, no acute distress Head: Normocephalic and atraumatic Eyes:  sclerae anicteric, EOMI Ears: Normal auditory acuity Mouth: No deformity or lesions Neck: Supple, no masses or thyromegaly Lungs: Clear throughout to auscultation Heart: Regular rate and rhythm; no murmurs, rubs or bruits Abdomen: Soft, non tender and non distended. Prior cholecystectomy incision. Very protuberant abdomen. No masses, hepatosplenomegaly or hernias noted. Normal Bowel sounds Musculoskeletal: Symmetrical with no gross deformities  Skin: No lesions on visible extremities Pulses:  Normal pulses noted Extremities: No clubbing,  cyanosis, edema or deformities noted Neurological: Alert oriented x 4, grossly nonfocal Cervical Nodes:  No significant cervical adenopathy Inguinal Nodes: No significant inguinal adenopathy Psychological:  Alert and cooperative. Normal mood and affect  Assessment and Recommendations:  1. Abnormal CT scan of the small bowel is most likely related to peristalsis but other causes need to be excluded. Proceed with CT enterography to rule out a small bowel lesion.  2. Chronic right-sided abdominal pain with occasional pain in her upper abdomen. Rule out irritable bowel syndrome or gas with bloating. Long-term high fiber diet with a daily stool softener. Trial of Levsin sublingually as needed. A component of her pain may be related to adhesions from her prior cholecystectomy incision. CMET, CBC and lipase today.  3. GERD with a prior history of peptic stricture and possible LPR. GERD is exacerbated by obesity and her protuberant abdomen.  Standard antireflux measures. Continue Nexium every morning and Pepcid every evening. Consider changing to Nexium twice a day if her control is not adequate.

## 2011-12-20 ENCOUNTER — Encounter: Payer: Self-pay | Admitting: Internal Medicine

## 2011-12-20 ENCOUNTER — Ambulatory Visit (INDEPENDENT_AMBULATORY_CARE_PROVIDER_SITE_OTHER): Payer: PRIVATE HEALTH INSURANCE | Admitting: Internal Medicine

## 2011-12-20 VITALS — BP 160/80 | HR 106 | Temp 98.0°F | Ht 67.0 in | Wt 251.0 lb

## 2011-12-20 DIAGNOSIS — M7989 Other specified soft tissue disorders: Secondary | ICD-10-CM

## 2011-12-20 DIAGNOSIS — I10 Essential (primary) hypertension: Secondary | ICD-10-CM

## 2011-12-20 DIAGNOSIS — R0602 Shortness of breath: Secondary | ICD-10-CM

## 2011-12-20 DIAGNOSIS — K219 Gastro-esophageal reflux disease without esophagitis: Secondary | ICD-10-CM

## 2011-12-20 MED ORDER — FAMOTIDINE 10 MG PO TABS: ORAL_TABLET | ORAL | Status: DC

## 2011-12-20 NOTE — Progress Notes (Signed)
  Subjective:    Patient ID: [name], female    DOB: 11/29/1950   MRN: [id number]   Brief patient profile:   37 yowf never smoker with sob off and on since 2010 referred to pulmonary clinic 09/20/2011 by Dr Morrie Sheldon with PFT's 10/2011 classic restrictive/obesity effects   HPI 09/20/2011 1st pulmonary eval previously felt to have asthma by [place] and variably sob x years assoc with episodes with "bronchitis" but in between does ok but indolent onset minimally progressive doe on a typical good day can't do a whole set of steps since 6-12 months s stopping and across parking lot into stores gets sob and weak about the same time no better with inhaler.  Wakes up every am with throat congestion > white mucus x tsp  Takes nexium bedtime no better.  rec Stop fish oil Try Nexium 40 mg   Take 30-60 min before first meal of the day and Pepcid 20 mg one bedtime  GERD diet reviwed  11/01/2011 f/u ov/Fahima Cifelli cc cough some better, still sob with more than room to room walking. No overt sinus or hb symptoms- has leg swelling ? From amlodipine and extremely concerned re package insert info. rec GERD diet Try Nexium 40 mg Take 30-60 min before first meal of the day and Pepcid 20 mg one bedtime Start Benicar hct 40 /25 one half each am - the swelling you are experiencing is likely coming from the amlodipine in tribenzor  12/20/2011 f/u ov/Yancy Knoble bp still up on benicar/hct one half daily but leg swelling better cc doe improving with wt loss, some cough at hs only, non productive - not clear if taking pepcid 20 mg at hs as rec.  Actually p gets to sleep does  ok without nocturnal  or early am exacerbation  of respiratory  c/o's or need for noct saba. Also denies any obvious fluctuation of symptoms with weather or environmental changes or other aggravating or alleviating factors except as outlined above   ROS  At present neg for  any significant sore throat, dysphagia, itching, sneezing,  nasal congestion or  excess/ purulent secretions,  fever, chills, sweats, unintended wt loss, pleuritic or exertional cp, hempoptysis, orthopnea pnd .  Also denies presyncope, palpitations, heartburn, abdominal pain, nausea, vomiting, diarrhea  or change in bowel or urinary habits, dysuria,hematuria,  rash, arthralgias, visual complaints, headache, numbness weakness or ataxia.     .        Objective:   Physical Exam  Wt 256 09/20/2011  >  11/01/2011 256 > 12/20/2011  251  Obese wf with pear shaped body habitus with ? Pseudo cushing's pattern  HEENT: nl dentition, turbinates, and orophanx. Nl external ear canals without cough reflex   NECK :  without JVD/Nodes/TM/ nl carotid upstrokes bilaterally   LUNGS: no acc muscle use, clear to A and P bilaterally without cough on insp or exp maneuvers   CV:  RRR  no s3 or murmur or increase in P2, no edema   ABD:  soft and nontender with nl excursion in the supine position. No bruits or organomegaly, bowel sounds nl  MS:  warm without deformities, calf tenderness, cyanosis or clubbing    Labs 09/15/11  HC03 28 BNP 14 cxr mild interstitial prominence        Assessment & Plan:

## 2011-12-20 NOTE — Patient Instructions (Addendum)
Increase benicar 40/25 one daily  Pepcid should be 20 mg at bedtime  Keep up the efforts to weight loss- remember calorie balance issues we discussed.   If you are satisfied with your treatment plan let your doctor know and he/she can either refill your medications or you can return here when your prescription runs out.     If in any way you are not 100% satisfied,  please tell us.  If 100% better, tell your friends!

## 2011-12-20 NOTE — Assessment & Plan Note (Signed)
Reviewed diet and need to take a full 20 mg pepcid at hs and f/u GI prn

## 2011-12-20 NOTE — Assessment & Plan Note (Signed)
Resolved off amlodipine to her satisfaction

## 2011-12-20 NOTE — Assessment & Plan Note (Signed)
-   CTangiogram 05/07/11> neg pe/ pos edema   - 2d Echo  06/16/11 Normal LV size with mild LV hypertrophy. EF 60-65%. Moderate diastolic dysfunction. Normal RV size and systolic function. - PFT's 11/01/2011 > FEV1 1.83 (70%) ratio 84 and no change p B2,  DLCO 65 corrects to 149 and ERV 19%  - 11/01/2011  Walked RA x 3 laps @ 185 ft each stopped due to  End of study, 91% ? Dropped in middle and back up c/w technical difficulty  DOE attributable to direct and indirect effects of obesity with very little to support asthma.  Reviewed calorie balance issues in summary discussion lasting 15-20 min of final office visit, rec pulmonary f/u prn

## 2011-12-20 NOTE — Assessment & Plan Note (Signed)
Not Adequate control on present rx, reviewed options, best one is to increase benicar to 40/25 one daily and f/u with Dr Laurance Flatten as planned

## 2011-12-21 ENCOUNTER — Other Ambulatory Visit: Payer: PRIVATE HEALTH INSURANCE

## 2011-12-22 ENCOUNTER — Ambulatory Visit (INDEPENDENT_AMBULATORY_CARE_PROVIDER_SITE_OTHER)
Admission: RE | Admit: 2011-12-22 | Discharge: 2011-12-22 | Disposition: A | Discharge status: Home / Self Care | Payer: PRIVATE HEALTH INSURANCE | Source: Ambulatory Visit | Attending: Gastroenterology | Admitting: Gastroenterology

## 2011-12-22 DIAGNOSIS — R109 Unspecified abdominal pain: Secondary | ICD-10-CM

## 2011-12-22 DIAGNOSIS — R933 Abnormal findings on diagnostic imaging of other parts of digestive tract: Secondary | ICD-10-CM

## 2011-12-22 MED ORDER — IOHEXOL 300 MG/ML  SOLN
100.0000 mL | Freq: Once | INTRAMUSCULAR | Status: AC | PRN
  Administered 2011-12-22: 100 mL via INTRAVENOUS

## 2012-02-29 ENCOUNTER — Encounter: Payer: Self-pay | Admitting: Cardiology

## 2012-05-09 ENCOUNTER — Other Ambulatory Visit: Payer: Self-pay | Admitting: Otolaryngology

## 2012-05-09 ENCOUNTER — Other Ambulatory Visit (HOSPITAL_COMMUNITY)
Admission: RE | Admit: 2012-05-09 | Discharge: 2012-05-09 | Disposition: A | Discharge status: Home / Self Care | Payer: PRIVATE HEALTH INSURANCE | Source: Ambulatory Visit | Attending: Otolaryngology | Admitting: Otolaryngology

## 2012-05-09 DIAGNOSIS — K1120 Sialoadenitis, unspecified: Secondary | ICD-10-CM | POA: Insufficient documentation

## 2012-06-18 ENCOUNTER — Ambulatory Visit (HOSPITAL_BASED_OUTPATIENT_CLINIC_OR_DEPARTMENT_OTHER): Payer: PRIVATE HEALTH INSURANCE | Attending: Otolaryngology | Admitting: Radiology

## 2012-06-18 VITALS — Ht 67.0 in | Wt 260.0 lb

## 2012-06-18 DIAGNOSIS — G4733 Obstructive sleep apnea (adult) (pediatric): Secondary | ICD-10-CM | POA: Insufficient documentation

## 2012-06-23 DIAGNOSIS — G4733 Obstructive sleep apnea (adult) (pediatric): Secondary | ICD-10-CM

## 2012-06-23 NOTE — Procedures (Signed)
NAMEMEYER, TREJOS              ACCOUNT NO.:  [id number]  MEDICAL RECORD NO.:  [id number]          PATIENT TYPE:  OUT  LOCATION:  SLEEP CENTER                 FACILITY:  [facility]  PHYSICIAN:  Abrar Koone D. Annamaria Boots, MD, FCCP, FACPDATE OF BIRTH:  10-23-50  DATE OF STUDY:  06/18/2012                           NOCTURNAL POLYSOMNOGRAM  REFERRING PHYSICIAN:  Onnie Graham, MD  INDICATION FOR STUDY:  Hypersomnia with sleep apnea.  EPWORTH SLEEPINESS SCORE:  10/24.  BMI 40.7, weight 260 pounds, height 67 inches, neck 18.5 inches.  MEDICATIONS:  Home medications are charted and reviewed.  SLEEP ARCHITECTURE:  Split study protocol.  During the diagnostic phase, total sleep time 147.5 minutes with sleep efficiency 86%.  Stage I was 12.5%, stage II 87.5%, stages III and REM were absent.  Sleep latency 3.5 minutes, awake after sleep onset 8.5 minutes, arousal index 83.4.  BEDTIME MEDICATION:  None.  Sleep was marked by frequent spontaneous waking throughout the night.  RESPIRATORY DATA:  Split study protocol.  Apnea-hypopnea index (AHI) 108.2 per hour.  A total of 266 events was scored including 57 obstructive apneas, 209 hypopneas.  Events were seen in all sleep positions.  CPAP was titrated to 19 CWP, AHI 0 per hour.  She wore a small ResMed Mirage Quattro full-face mask with heated humidifier and a C-Flex setting of 3.  OXYGEN DATA:  Before CPAP, snoring was moderate with oxygen desaturation to a nadir of 84% on room air.  With CPAP titration, snoring was prevented and mean oxygen saturation held 90.2% on room air.  CARDIAC DATA:  Sinus rhythm with occasional PVC.  MOVEMENT-PARASOMNIA:  Frequent limb jerks were noted during the titration phase, which may reflect CPAP disturbance.  Bathroom x2.  IMPRESSIONS-RECOMMENDATIONS: 1. Severe obstructive sleep apnea/hypopnea syndrome, AHI 108.2 per     hour with non-positional events.  Moderate snoring with oxygen     desaturation to a nadir  of 84% on room air. 2. Successful continuous positive airway pressure titration to 19 CWP,     AHI 0 per hour.  She wore a small ResMed Mirage Quattro full-face     mask with heated humidifier and C-flex setting of 3.  Snoring was     prevented and mean oxygen saturation held 90.2% on room air. 3. Frequent limb jerks were noted only during the titration phase.     Limb jerks are common during initial continuous positive airway     pressure sleep disturbance until the patient becomes accustomed to     wearing     continuous positive airway pressure.  If limb movement, sleep     disturbance persists in the home environment, then specific therapy     might be considered if appropriate.     Kandiss Ihrig D. Annamaria Boots, MD, [facility], [place], [place] Board of Sleep Medicine    CDY/MEDQ  D:  06/23/2012 11:47:35  T:  06/23/2012 [id number]  Job:  [id number]

## 2012-07-18 ENCOUNTER — Ambulatory Visit (INDEPENDENT_AMBULATORY_CARE_PROVIDER_SITE_OTHER): Payer: PRIVATE HEALTH INSURANCE | Admitting: Cardiology

## 2012-07-18 ENCOUNTER — Encounter: Payer: Self-pay | Admitting: Cardiology

## 2012-07-18 VITALS — BP 140/75 | HR 101 | Ht 68.0 in | Wt 259.0 lb

## 2012-07-18 DIAGNOSIS — I2510 Atherosclerotic heart disease of native coronary artery without angina pectoris: Secondary | ICD-10-CM

## 2012-07-18 DIAGNOSIS — I319 Disease of pericardium, unspecified: Secondary | ICD-10-CM

## 2012-07-18 DIAGNOSIS — I313 Pericardial effusion (noninflammatory): Secondary | ICD-10-CM

## 2012-07-18 MED ORDER — METOPROLOL TARTRATE 50 MG PO TABS: 75.0000 mg | ORAL_TABLET | Freq: Two times a day (BID) | ORAL | Status: DC

## 2012-07-18 NOTE — Progress Notes (Signed)
HPI The patient presents for evaluation of ankle swelling and increased dyspnea. This was happening over several weeks. Her daughter who is a Marine scientist thought she might be in heart failure.  However, when her medications were adjusted with discontinuation of Norvasc her swelling improved. She thinks her breathing is back to baseline. Her weights have been stable. He is not describing new chest pressure, neck or arm discomfort. She's not describing any new presyncope or syncope. She does notice her heart racing occasionally a little higher and she actually feels that today. Of note she was recently found to have sleep apnea and is starting to use CPAP. She also has a lesion on the left posterior mandibular side is due to be resected.   Allergies  Allergen Reactions  . Ezetimibe-Simvastatin   . Levemir (Insulin Detemir)   . Penicillins     Current Outpatient Prescriptions  Medication Sig Dispense Refill  . aspirin 325 MG tablet Take 325 mg by mouth daily.        . Canagliflozin (INVOKANA) 100 MG TABS Take by mouth. 1 tab daily for diabetes      . esomeprazole (NEXIUM) 40 MG capsule Take 40 mg by mouth daily.        . insulin aspart protamine-insulin aspart (NOVOLOG 70/30) (70-30) 100 UNIT/ML injection Inject into the skin.        Marland Kitchen insulin glargine (LANTUS) 100 UNIT/ML injection Inject 100 Units into the skin 2 (two) times daily.       . metFORMIN (GLUCOPHAGE) 500 MG tablet Take 1,000 mg by mouth 2 (two) times daily with a meal.       . metoprolol (LOPRESSOR) 50 MG tablet Take 50 mg by mouth 2 (two) times daily.        Marland Kitchen olmesartan-hydrochlorothiazide (BENICAR HCT) 40-25 MG per tablet Take 1 tablet by mouth daily.      Marland Kitchen OVER THE COUNTER MEDICATION Mag/ calicum/vitaminD  Tablet 1 daily      . Pitavastatin Calcium (LIVALO PO) Take by mouth.        Marland Kitchen albuterol (VENTOLIN HFA) 108 (90 BASE) MCG/ACT inhaler Inhale 2 puffs into the lungs every 6 (six) hours as needed.      . famotidine (PEPCID AC)  10 MG tablet One at bedtime      . hyoscyamine (LEVSIN/SL) 0.125 MG SL tablet Place 1 tablet (0.125 mg total) under the tongue every 4 (four) hours as needed for cramping.  60 tablet  11  . [DISCONTINUED] Olmesartan-Amlodipine-HCTZ (TRIBENZOR) 40-5-12.5 MG TABS Take 1 tablet by mouth daily.          Past Medical History  Diagnosis Date  . 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 Hypertension   . Hyperlipidemia   . Cystocele   . IBS (irritable bowel syndrome)   . Varicose veins   . Superficial phlebitis   . Esophageal stricture   . Fatty liver   . Osteopenia   . Vitamin D deficiency   . Effusion, pericardium   . CAD (coronary artery disease)     Nonobstructive  . Dyslipidemia   . GERD (gastroesophageal reflux disease)   . Osteoarthritis     Past Surgical History  Procedure Date  . Lerft cataract 2003    Dr. Ishmael Holter   . Tonsillectomy 1968  . Cholecystectomy 1982  . Carpal tunnel release     Left  . Tubal ligation     ROS:  As stated  in the HPI and negative for all other systems.  PHYSICAL EXAM BP 140/75  Pulse 101  Ht 5\' 8"  (1.727 m)  Wt 259 lb (117.482 kg)  BMI 39.38 kg/m2 GENERAL:  Well appearing HEENT:  Pupils equal round and reactive, fundi not visualized, oral mucosa unremarkable NECK:  No jugular venous distention, waveform within normal limits, carotid upstroke brisk and symmetric, no bruits, positive transmitted systolic murmur no thyromegaly LYMPHATICS:  No cervical, inguinal adenopathy LUNGS:  Clear to auscultation bilaterally BACK:  No CVA tenderness CHEST:  Unremarkable HEART:  PMI not displaced or sustained,S1 and S2 within normal limits, no S3, no S4, no clicks, no rubs, apical early peaking systolic murmur ABD:  Flat, positive bowel sounds normal in frequency in pitch, no bruits, no rebound, no guarding, no midline pulsatile mass, no hepatomegaly, no splenomegaly, obese EXT:  2 plus pulses throughout, positive mild edema, no  cyanosis no clubbing SKIN:  No rashes no nodules NEURO:  Cranial nerves II through XII grossly intact, motor grossly intact throughout PSYCH:  Cognitively intact, oriented to person place and time  EKG:  Sinus rhythm, rate 101, axis within normal limits, intervals within normal limits, poor anterior R wave progression, lateral T-wave inversions. No change from previous. 07/18/2012   ASSESSMENT AND PLAN  PERICARDIAL EFFUSION - I don't strongly suspect that this is related to her edema. That probably was related to medications. However, I would like to follow this effusion with repeat echocardiography.  DYSPNEA -  Her dyspnea does seem to be at baseline. This will be evaluated with the echo as above.  HYPERTENSION -  Her blood pressure may be easier to control with CPAP. However, he remains slightly elevated on recent readings. Therefore, I will increase her metoprolol to 25 mg twice a day.  Obesity -  We have had multiple discussions in questions about treat this over the past.

## 2012-07-18 NOTE — Patient Instructions (Addendum)
Please increase your Metoprolol to 75 mg twice a day Continue all other medications as listed  Your physician has requested that you have an echocardiogram. Echocardiography is a painless test that uses sound waves to create images of your heart. It provides your doctor with information about the size and shape of your heart and how well your heart's chambers and valves are working. This procedure takes approximately one hour. There are no restrictions for this procedure.  Follow up in 1 year with Dr Percival Spanish.  You will receive a letter in the mail 2 months before you are due.  Please call us when you receive this letter to schedule your follow up appointment.

## 2012-07-26 ENCOUNTER — Other Ambulatory Visit (HOSPITAL_COMMUNITY): Payer: PRIVATE HEALTH INSURANCE

## 2012-07-27 ENCOUNTER — Ambulatory Visit (HOSPITAL_COMMUNITY): Payer: PRIVATE HEALTH INSURANCE | Attending: Cardiology | Admitting: Radiology

## 2012-07-27 DIAGNOSIS — R0609 Other forms of dyspnea: Secondary | ICD-10-CM

## 2012-07-27 DIAGNOSIS — I313 Pericardial effusion (noninflammatory): Secondary | ICD-10-CM

## 2012-07-27 DIAGNOSIS — E669 Obesity, unspecified: Secondary | ICD-10-CM | POA: Insufficient documentation

## 2012-07-27 DIAGNOSIS — I2510 Atherosclerotic heart disease of native coronary artery without angina pectoris: Secondary | ICD-10-CM | POA: Insufficient documentation

## 2012-07-27 DIAGNOSIS — I10 Essential (primary) hypertension: Secondary | ICD-10-CM | POA: Insufficient documentation

## 2012-07-27 DIAGNOSIS — E119 Type 2 diabetes mellitus without complications: Secondary | ICD-10-CM | POA: Insufficient documentation

## 2012-07-27 DIAGNOSIS — I38 Endocarditis, valve unspecified: Secondary | ICD-10-CM | POA: Insufficient documentation

## 2012-07-27 DIAGNOSIS — R0989 Other specified symptoms and signs involving the circulatory and respiratory systems: Secondary | ICD-10-CM

## 2012-07-27 DIAGNOSIS — I319 Disease of pericardium, unspecified: Secondary | ICD-10-CM

## 2012-07-27 DIAGNOSIS — E785 Hyperlipidemia, unspecified: Secondary | ICD-10-CM | POA: Insufficient documentation

## 2012-07-27 NOTE — Progress Notes (Signed)
Echocardiogram performed.  

## 2012-08-06 ENCOUNTER — Other Ambulatory Visit (HOSPITAL_COMMUNITY): Payer: Self-pay | Admitting: Otolaryngology

## 2012-08-27 ENCOUNTER — Encounter (HOSPITAL_COMMUNITY): Payer: Self-pay

## 2012-08-29 ENCOUNTER — Encounter (HOSPITAL_COMMUNITY)
Admission: RE | Admit: 2012-08-29 | Discharge: 2012-08-29 | Disposition: A | Discharge status: Home / Self Care | Payer: PRIVATE HEALTH INSURANCE | Source: Ambulatory Visit | Attending: Otolaryngology | Admitting: Otolaryngology

## 2012-08-29 ENCOUNTER — Encounter (HOSPITAL_COMMUNITY): Payer: Self-pay

## 2012-08-29 LAB — SURGICAL PCR SCREEN
MRSA, PCR: NEGATIVE
Staphylococcus aureus: POSITIVE — AB

## 2012-08-29 LAB — BASIC METABOLIC PANEL
BUN: 26 mg/dL — ABNORMAL HIGH (ref 6–23)
CO2: 24 mEq/L (ref 19–32)
Calcium: 9.5 mg/dL (ref 8.4–10.5)
Chloride: 102 mEq/L (ref 96–112)
Creatinine, Ser: 0.91 mg/dL (ref 0.50–1.10)
GFR calc Af Amer: 77 mL/min — ABNORMAL LOW (ref >90)
GFR calc non Af Amer: 67 mL/min — ABNORMAL LOW (ref >90)
Glucose, Bld: 216 mg/dL — ABNORMAL HIGH (ref 70–99)
Potassium: 4.2 mEq/L (ref 3.5–5.1)
Sodium: 142 mEq/L (ref 135–145)

## 2012-08-29 LAB — CBC
HCT: 43 % (ref 36.0–46.0)
Hemoglobin: 14.5 g/dL (ref 12.0–15.0)
MCH: 28.8 pg (ref 26.0–34.0)
MCHC: 33.7 g/dL (ref 30.0–36.0)
MCV: 85.5 fL (ref 78.0–100.0)
Platelets: 205 10*3/uL (ref 150–400)
RBC: 5.03 MIL/uL (ref 3.87–5.11)
RDW: 14.4 % (ref 11.5–15.5)
WBC: 10.8 10*3/uL — ABNORMAL HIGH (ref 4.0–10.5)

## 2012-08-29 NOTE — Progress Notes (Signed)
Will give chart to [name]  For review

## 2012-08-29 NOTE — Pre-Procedure Instructions (Signed)
Sanah ASHLAN GEPPERT  08/29/2012   Your procedure is scheduled on:  09/07/12  Report to [facility] at 730 AM.  Call this number if you have problems the morning of surgery: [phone]   Remember:   Do not eat food or drink liquids after midnight.   Take these medicines the morning of surgery with A SIP OF WATER: nexium,metoprolol   Do not wear jewelry, make-up or nail polish.  Do not wear lotions, powders, or perfumes. You may wear deodorant.  Do not shave 48 hours prior to surgery. Men may shave face and neck.  Do not bring valuables to the hospital.  Contacts, dentures or bridgework may not be worn into surgery.  Leave suitcase in the car. After surgery it may be brought to your room.  For patients admitted to the hospital, checkout time is 11:00 AM the day of  discharge.   Patients discharged the day of surgery will not be allowed to drive  home.  Name and phone number of your driver: family  Special Instructions: Shower using CHG 2 nights before surgery and the night before surgery.  If you shower the day of surgery use CHG.  Use special wash - you have one bottle of CHG for all showers.  You should use approximately 1/3 of the bottle for each shower.   Please read over the following fact sheets that you were given: Pain Booklet, Coughing and Deep Breathing, MRSA Information and Surgical Site Infection Prevention

## 2012-08-30 ENCOUNTER — Encounter (HOSPITAL_COMMUNITY): Payer: Self-pay | Admitting: Vascular Surgery

## 2012-08-30 NOTE — Consult Note (Signed)
Anesthesia Chart Review:  Patient is a 62 year old female scheduled for left parotidectomy by Dr. Redmond Baseman on 09/07/12.  History includes non-smoker, DM2, HLD, IBS, fatty liver by 11/2011 CT, GERD, OSA, OA, esophageal stricture, morbid obesity, HTN, non-obstructive CAD by 2006 cath, small pericardial effusion (documented on echos since 2007, improved in size by 07/27/12 echo). Mild AS by echo on 07/2012.  Cardiologist is Dr. Percival Spanish, last visit on 07/18/12.  According to his note, he is aware that she needs resection of a left posterior mandibular lesion.  She had experienced some dyspnea and ankle edema that improved with stopping her Norvasc, but he wanted to get a follow-up echo (see below) to evaluate for possible additional etiologies.  EKG then showed sinus rhythm, rate 101, axis within normal limits, intervals within normal limits, poor anterior R wave progression, lateral T-wave inversions. No change from previous (from 06/02/11).    Echo on 07/27/12 showed: - Left ventricle: The cavity size was normal. Wall thickness was increased in a pattern of mild LVH. Systolic function was normal. The estimated ejection fraction was in the range of 55% to 60%. - Aortic valve: Moderately calcified non coronary cusp There was mild stenosis. - Mitral valve: Moderate calcification of anterior leaflet tip Calcified annulus. - Left atrium: The atrium was moderately dilated. - Atrial septum: No defect or patent foramen ovale was identified. - Pericardium, extracardiac: There is a small posterior pericardial effusion. The RV free wall and apex are thickened. Cannot tell from echo if this is a non echoluscent effusion or just epicardial fat. Suggest possible right and left cath and CT to further assess hemodynamics and anatomy if clinically indicated. This was reviewed by Dr. Percival Spanish who felt her pericardial effusion had improved since 06/16/11 and did not recommended any further studies.  Cardiac cath on 01/06/05  showed: Hemodynamics: LV 147/20, A 147/74. Coronaries: The left main was normal. The LAD had osteal 25% stenosis. There was 25-30% stenosis after the first diagonal. The circumflex in the AV groove was normal. OM1 to OM3 were small and normal. OM4 was large, branching and normal. The right coronary artery was dominant. There were luminal irregularities. There was distal 25% stenosis after the PDA. Left ventriculogram: The left ventriculogram was obtained in the RAO projection. The ejection fraction was 65% with normal wall motion.  Medical management was recommended.  Pulmonologist is Dr. Melvyn Novas.  He has seen her for dyspnea.  Work-up included CTA of the chest, 2D echo, PFTs, walk test. PFT's 11/01/2011 > FEV1 1.83 (70%) ratio 84 and no change p B2, DLCO 65 corrects to 149 and ERV 19%  - 11/01/2011 Walked RA x 3 laps @ 185 ft each stopped due to End of study, 91% ? Dropped in middle and back up c/w technical difficulty. Per his 12/20/11, he felt her "DOE was attribuable to direct and indirect effects of obesity with very little to support asthma."  He recommended pulmonology f/u PRN.  However, she did have a sleep study by Dr. Annamaria Boots on 06/18/12 that showed severe OSA/hypopnea syndrome and she has been started on CPAP.  CXR on 06/13/12 showed cardiomegaly, no active disease.  There was no focal hepatic lesion, normal size of pancreas, spleen, adrenal glands, kidneys, prior cholecystectomy by CT enterography on 12/22/11.  Pre-operative labs noted.  Cr 0.91, glucose 216.  WBC 10.8, H/H WNL.  PLT 205.  Her AST/ALT were normal on 12/19/11.  She will get a CBG on arrival.  She was been seen by  her cardiologist within the past 8 weeks. No further testing was recommended following her echo in December 2013.  She has recently started CPAP.  She will be evaluated by her assigned anesthesiologist on the day of surgery, but would anticipate if no acute changes in her cardiopulmonary status then she could proceed as  planned.  Alison Gianotti, PA-C 08/30/12 1350

## 2012-09-07 ENCOUNTER — Encounter (HOSPITAL_COMMUNITY): Payer: Self-pay | Admitting: Vascular Surgery

## 2012-09-07 ENCOUNTER — Observation Stay (HOSPITAL_COMMUNITY)
Admission: RE | Admit: 2012-09-07 | Discharge: 2012-09-08 | Disposition: A | Discharge status: Home / Self Care | Payer: PRIVATE HEALTH INSURANCE | Source: Ambulatory Visit | Attending: Otolaryngology | Admitting: Otolaryngology

## 2012-09-07 ENCOUNTER — Ambulatory Visit (HOSPITAL_COMMUNITY): Payer: PRIVATE HEALTH INSURANCE | Admitting: Vascular Surgery

## 2012-09-07 ENCOUNTER — Encounter (HOSPITAL_COMMUNITY)
Admission: RE | Disposition: A | Discharge status: Home / Self Care | Payer: Self-pay | Source: Ambulatory Visit | Attending: Otolaryngology

## 2012-09-07 ENCOUNTER — Encounter (HOSPITAL_COMMUNITY): Payer: Self-pay | Admitting: *Deleted

## 2012-09-07 DIAGNOSIS — E119 Type 2 diabetes mellitus without complications: Secondary | ICD-10-CM | POA: Insufficient documentation

## 2012-09-07 DIAGNOSIS — D490 Neoplasm of unspecified behavior of digestive system: Secondary | ICD-10-CM

## 2012-09-07 DIAGNOSIS — R0602 Shortness of breath: Secondary | ICD-10-CM

## 2012-09-07 DIAGNOSIS — Z01812 Encounter for preprocedural laboratory examination: Secondary | ICD-10-CM | POA: Insufficient documentation

## 2012-09-07 DIAGNOSIS — G4730 Sleep apnea, unspecified: Secondary | ICD-10-CM | POA: Insufficient documentation

## 2012-09-07 DIAGNOSIS — T884XXA Failed or difficult intubation, initial encounter: Secondary | ICD-10-CM

## 2012-09-07 DIAGNOSIS — I10 Essential (primary) hypertension: Secondary | ICD-10-CM | POA: Insufficient documentation

## 2012-09-07 DIAGNOSIS — D119 Benign neoplasm of major salivary gland, unspecified: Principal | ICD-10-CM | POA: Insufficient documentation

## 2012-09-07 DIAGNOSIS — Z794 Long term (current) use of insulin: Secondary | ICD-10-CM | POA: Insufficient documentation

## 2012-09-07 HISTORY — PX: PAROTIDECTOMY: SHX2163

## 2012-09-07 HISTORY — DX: Failed or difficult intubation, initial encounter: T88.4XXA

## 2012-09-07 LAB — GLUCOSE, CAPILLARY
Glucose-Capillary: 299 mg/dL — ABNORMAL HIGH (ref 70–99)
Glucose-Capillary: 241 mg/dL — ABNORMAL HIGH (ref 70–99)
Glucose-Capillary: 232 mg/dL — ABNORMAL HIGH (ref 70–99)
Glucose-Capillary: 371 mg/dL — ABNORMAL HIGH (ref 70–99)
Glucose-Capillary: 411 mg/dL — ABNORMAL HIGH (ref 70–99)

## 2012-09-07 SURGERY — EXCISION, PAROTID GLAND
Anesthesia: General | Site: Neck | Laterality: Left | Wound class: Clean

## 2012-09-07 MED ORDER — POTASSIUM CHLORIDE IN NACL 20-0.45 MEQ/L-% IV SOLN
INTRAVENOUS | Status: DC
  Administered 2012-09-07: 14:00:00 via INTRAVENOUS
  Filled 2012-09-07 (×2): qty 1000

## 2012-09-07 MED ORDER — ONDANSETRON HCL 4 MG PO TABS: 4.0000 mg | ORAL_TABLET | ORAL | Status: DC | PRN

## 2012-09-07 MED ORDER — LIDOCAINE-EPINEPHRINE 1 %-1:100000 IJ SOLN
INTRAMUSCULAR | Status: AC
  Filled 2012-09-07: qty 1

## 2012-09-07 MED ORDER — MORPHINE SULFATE 2 MG/ML IJ SOLN: 2.0000 mg | INTRAMUSCULAR | Status: DC | PRN

## 2012-09-07 MED ORDER — PHENYLEPHRINE HCL 10 MG/ML IJ SOLN
10.0000 mg | INTRAVENOUS | Status: DC | PRN
  Administered 2012-09-07: 25 ug/min via INTRAVENOUS

## 2012-09-07 MED ORDER — PROPOFOL 10 MG/ML IV BOLUS
INTRAVENOUS | Status: DC | PRN
  Administered 2012-09-07: 200 mg via INTRAVENOUS
  Administered 2012-09-07 (×2): 50 mg via INTRAVENOUS

## 2012-09-07 MED ORDER — CEFAZOLIN SODIUM-DEXTROSE 2-3 GM-% IV SOLR
INTRAVENOUS | Status: AC
  Administered 2012-09-07: 2 g via INTRAVENOUS
  Filled 2012-09-07: qty 50

## 2012-09-07 MED ORDER — INSULIN ASPART 100 UNIT/ML ~~LOC~~ SOLN
0.0000 [IU] | Freq: Three times a day (TID) | SUBCUTANEOUS | Status: DC
Start: 2012-09-07 — End: 2012-09-08
  Administered 2012-09-08: 11 [IU] via SUBCUTANEOUS

## 2012-09-07 MED ORDER — HYDROCODONE-ACETAMINOPHEN 5-325 MG PO TABS
1.0000 | ORAL_TABLET | ORAL | Status: DC | PRN
  Administered 2012-09-07 (×2): 2 via ORAL
  Filled 2012-09-07: qty 2
  Filled 2012-09-07: qty 1
  Filled 2012-09-07 (×2): qty 2

## 2012-09-07 MED ORDER — INSULIN ASPART 100 UNIT/ML ~~LOC~~ SOLN: 0.0000 [IU] | Freq: Every day | SUBCUTANEOUS | Status: DC

## 2012-09-07 MED ORDER — OLMESARTAN MEDOXOMIL-HCTZ 40-25 MG PO TABS: 1.0000 | ORAL_TABLET | Freq: Every day | ORAL | Status: DC

## 2012-09-07 MED ORDER — LACTATED RINGERS IV SOLN
INTRAVENOUS | Status: DC | PRN
  Administered 2012-09-07 (×2): via INTRAVENOUS

## 2012-09-07 MED ORDER — INSULIN ASPART 100 UNIT/ML ~~LOC~~ SOLN
10.0000 [IU] | Freq: Once | SUBCUTANEOUS | Status: AC
  Administered 2012-09-07: 10 [IU] via INTRAVENOUS

## 2012-09-07 MED ORDER — LIDOCAINE HCL (CARDIAC) 20 MG/ML IV SOLN
INTRAVENOUS | Status: DC | PRN
  Administered 2012-09-07: 100 mg via INTRAVENOUS

## 2012-09-07 MED ORDER — SUCCINYLCHOLINE CHLORIDE 20 MG/ML IJ SOLN
INTRAMUSCULAR | Status: DC | PRN
  Administered 2012-09-07: 120 mg via INTRAVENOUS
  Administered 2012-09-07: 100 mg via INTRAVENOUS

## 2012-09-07 MED ORDER — MIDAZOLAM HCL 5 MG/5ML IJ SOLN
INTRAMUSCULAR | Status: DC | PRN
  Administered 2012-09-07: 1 mg via INTRAVENOUS

## 2012-09-07 MED ORDER — CEFAZOLIN SODIUM 1-5 GM-% IV SOLN
1.0000 g | Freq: Three times a day (TID) | INTRAVENOUS | Status: AC
  Administered 2012-09-07 – 2012-09-08 (×3): 1 g via INTRAVENOUS
  Filled 2012-09-07 (×3): qty 50

## 2012-09-07 MED ORDER — METOPROLOL TARTRATE 50 MG PO TABS
75.0000 mg | ORAL_TABLET | Freq: Two times a day (BID) | ORAL | Status: DC
  Administered 2012-09-07 – 2012-09-08 (×2): 75 mg via ORAL
  Filled 2012-09-07 (×3): qty 1

## 2012-09-07 MED ORDER — METFORMIN HCL 500 MG PO TABS
1000.0000 mg | ORAL_TABLET | Freq: Two times a day (BID) | ORAL | Status: DC
Start: 2012-09-07 — End: 2012-09-08
  Administered 2012-09-07 – 2012-09-08 (×2): 1000 mg via ORAL
  Filled 2012-09-07 (×4): qty 2

## 2012-09-07 MED ORDER — HYDROMORPHONE HCL PF 1 MG/ML IJ SOLN: 0.2500 mg | INTRAMUSCULAR | Status: DC | PRN

## 2012-09-07 MED ORDER — INSULIN ASPART 100 UNIT/ML ~~LOC~~ SOLN
20.0000 [IU] | Freq: Once | SUBCUTANEOUS | Status: AC
  Administered 2012-09-07: 20 [IU] via SUBCUTANEOUS

## 2012-09-07 MED ORDER — ONDANSETRON HCL 4 MG/2ML IJ SOLN: 4.0000 mg | INTRAMUSCULAR | Status: DC | PRN

## 2012-09-07 MED ORDER — OXYMETAZOLINE HCL 0.05 % NA SOLN
NASAL | Status: AC
  Filled 2012-09-07: qty 15

## 2012-09-07 MED ORDER — OXYMETAZOLINE HCL 0.05 % NA SOLN
NASAL | Status: DC | PRN
  Administered 2012-09-07: 1 via NASAL

## 2012-09-07 MED ORDER — LIDOCAINE-EPINEPHRINE 1 %-1:100000 IJ SOLN
INTRAMUSCULAR | Status: DC | PRN
  Administered 2012-09-07: 5 mL

## 2012-09-07 MED ORDER — BACITRACIN ZINC 500 UNIT/GM EX OINT
1.0000 "application " | TOPICAL_OINTMENT | Freq: Three times a day (TID) | CUTANEOUS | Status: DC
  Administered 2012-09-07 – 2012-09-08 (×3): 1 via TOPICAL
  Filled 2012-09-07: qty 15

## 2012-09-07 MED ORDER — INSULIN GLARGINE 100 UNIT/ML ~~LOC~~ SOLN
100.0000 [IU] | Freq: Two times a day (BID) | SUBCUTANEOUS | Status: DC
  Administered 2012-09-07 – 2012-09-08 (×3): 100 [IU] via SUBCUTANEOUS

## 2012-09-07 MED ORDER — ATORVASTATIN CALCIUM 20 MG PO TABS
20.0000 mg | ORAL_TABLET | Freq: Every day | ORAL | Status: DC
  Administered 2012-09-07: 20 mg via ORAL
  Filled 2012-09-07 (×2): qty 1

## 2012-09-07 MED ORDER — BACITRACIN ZINC 500 UNIT/GM EX OINT
TOPICAL_OINTMENT | CUTANEOUS | Status: AC
  Filled 2012-09-07: qty 15

## 2012-09-07 MED ORDER — ONDANSETRON HCL 4 MG/2ML IJ SOLN
INTRAMUSCULAR | Status: DC | PRN
  Administered 2012-09-07: 4 mg via INTRAVENOUS

## 2012-09-07 MED ORDER — CANAGLIFLOZIN 100 MG PO TABS
100.0000 mg | ORAL_TABLET | Freq: Every day | ORAL | Status: DC
  Filled 2012-09-07: qty 1

## 2012-09-07 MED ORDER — BACITRACIN ZINC 500 UNIT/GM EX OINT
TOPICAL_OINTMENT | CUTANEOUS | Status: DC | PRN
  Administered 2012-09-07: 1 via TOPICAL

## 2012-09-07 MED ORDER — IRBESARTAN 300 MG PO TABS
300.0000 mg | ORAL_TABLET | Freq: Every day | ORAL | Status: DC
  Administered 2012-09-08: 300 mg via ORAL
  Filled 2012-09-07: qty 1

## 2012-09-07 MED ORDER — PANTOPRAZOLE SODIUM 40 MG PO TBEC: 80.0000 mg | DELAYED_RELEASE_TABLET | Freq: Every day | ORAL | Status: DC

## 2012-09-07 MED ORDER — SODIUM CHLORIDE 0.9 % IR SOLN
Status: DC | PRN
  Administered 2012-09-07: 1

## 2012-09-07 MED ORDER — HYDROCHLOROTHIAZIDE 25 MG PO TABS
25.0000 mg | ORAL_TABLET | Freq: Every day | ORAL | Status: DC
  Administered 2012-09-08: 25 mg via ORAL
  Filled 2012-09-07: qty 1

## 2012-09-07 MED ORDER — PHENYLEPHRINE HCL 10 MG/ML IJ SOLN
INTRAMUSCULAR | Status: DC | PRN
  Administered 2012-09-07 (×2): 40 ug via INTRAVENOUS
  Administered 2012-09-07: 80 ug via INTRAVENOUS
  Administered 2012-09-07: 40 ug via INTRAVENOUS
  Administered 2012-09-07 (×2): 120 ug via INTRAVENOUS
  Administered 2012-09-07: 80 ug via INTRAVENOUS
  Administered 2012-09-07: 40 ug via INTRAVENOUS

## 2012-09-07 MED ORDER — FENTANYL CITRATE 0.05 MG/ML IJ SOLN
INTRAMUSCULAR | Status: DC | PRN
  Administered 2012-09-07: 50 ug via INTRAVENOUS
  Administered 2012-09-07: 100 ug via INTRAVENOUS

## 2012-09-07 MED ORDER — ONDANSETRON HCL 4 MG/2ML IJ SOLN: 4.0000 mg | Freq: Once | INTRAMUSCULAR | Status: DC | PRN

## 2012-09-07 MED ORDER — HYOSCYAMINE SULFATE 0.125 MG SL SUBL
0.1250 mg | SUBLINGUAL_TABLET | SUBLINGUAL | Status: DC | PRN
  Filled 2012-09-07: qty 1

## 2012-09-07 MED ORDER — INSULIN ASPART 100 UNIT/ML ~~LOC~~ SOLN
SUBCUTANEOUS | Status: AC
  Filled 2012-09-07: qty 1

## 2012-09-07 MED ORDER — DEXAMETHASONE SODIUM PHOSPHATE 4 MG/ML IJ SOLN
INTRAMUSCULAR | Status: DC | PRN
  Administered 2012-09-07: 8 mg via INTRAVENOUS

## 2012-09-07 SURGICAL SUPPLY — 49 items
APL SKNCLS STERI-STRIP NONHPOA (GAUZE/BANDAGES/DRESSINGS) ×1
ATTRACTOMAT 16X20 MAGNETIC DRP (DRAPES) ×1 IMPLANT
BENZOIN TINCTURE PRP APPL 2/3 (GAUZE/BANDAGES/DRESSINGS) ×1 IMPLANT
CANISTER SUCTION 2500CC (MISCELLANEOUS) ×2 IMPLANT
CLEANER TIP ELECTROSURG 2X2 (MISCELLANEOUS) ×2 IMPLANT
CLOTH BEACON ORANGE TIMEOUT ST (SAFETY) ×2 IMPLANT
CONT SPEC 4OZ CLIKSEAL STRL BL (MISCELLANEOUS) ×1 IMPLANT
CORDS BIPOLAR (ELECTRODE) ×2 IMPLANT
COVER SURGICAL LIGHT HANDLE (MISCELLANEOUS) ×2 IMPLANT
CRADLE DONUT ADULT HEAD (MISCELLANEOUS) IMPLANT
DECANTER SPIKE VIAL GLASS SM (MISCELLANEOUS) ×1 IMPLANT
DRAIN JACKSON RD 7FR 3/32 (WOUND CARE) ×1 IMPLANT
DRAPE INCISE 13X13 STRL (DRAPES) ×2 IMPLANT
ELECT COATED BLADE 2.86 ST (ELECTRODE) ×2 IMPLANT
ELECT PAIRED SUBDERMAL (MISCELLANEOUS) ×2
ELECT REM PT RETURN 9FT ADLT (ELECTROSURGICAL) ×2
ELECTRODE PAIRED SUBDERMAL (MISCELLANEOUS) IMPLANT
ELECTRODE REM PT RTRN 9FT ADLT (ELECTROSURGICAL) ×1 IMPLANT
EVACUATOR SILICONE 100CC (DRAIN) ×2 IMPLANT
GAUZE SPONGE 4X4 16PLY XRAY LF (GAUZE/BANDAGES/DRESSINGS) ×2 IMPLANT
GLOVE BIO SURGEON STRL SZ 6.5 (GLOVE) ×2 IMPLANT
GLOVE BIO SURGEON STRL SZ7 (GLOVE) ×1 IMPLANT
GLOVE BIO SURGEON STRL SZ7.5 (GLOVE) ×2 IMPLANT
GLOVE BIOGEL PI IND STRL 8 (GLOVE) IMPLANT
GLOVE BIOGEL PI INDICATOR 8 (GLOVE) ×1
GLOVE SURG SS PI 6.5 STRL IVOR (GLOVE) ×1 IMPLANT
GLOVE SURG SS PI 8.5 STRL IVOR (GLOVE) ×1
GLOVE SURG SS PI 8.5 STRL STRW (GLOVE) IMPLANT
GOWN STRL NON-REIN LRG LVL3 (GOWN DISPOSABLE) ×8 IMPLANT
KIT BASIN OR (CUSTOM PROCEDURE TRAY) ×2 IMPLANT
KIT ROOM TURNOVER OR (KITS) ×2 IMPLANT
NS IRRIG 1000ML POUR BTL (IV SOLUTION) ×3 IMPLANT
PAD ARMBOARD 7.5X6 YLW CONV (MISCELLANEOUS) ×4 IMPLANT
PENCIL BUTTON HOLSTER BLD 10FT (ELECTRODE) ×2 IMPLANT
PROBE NERVBE PRASS .33 (MISCELLANEOUS) ×1 IMPLANT
SPECIMEN JAR MEDIUM (MISCELLANEOUS) ×1 IMPLANT
STAPLER VISISTAT 35W (STAPLE) ×2 IMPLANT
SUT ETHILON 2 0 FS 18 (SUTURE) ×2 IMPLANT
SUT ETHILON 5 0 P 3 18 (SUTURE) ×1
SUT NYLON ETHILON 5-0 P-3 1X18 (SUTURE) ×1 IMPLANT
SUT SILK 2 0 FS (SUTURE) ×3 IMPLANT
SUT SILK 3 0 REEL (SUTURE) ×1 IMPLANT
SUT SILK 4 0 REEL (SUTURE) ×1 IMPLANT
SUT VIC AB 3-0 SH 27 (SUTURE) ×2
SUT VIC AB 3-0 SH 27X BRD (SUTURE) ×1 IMPLANT
TOWEL OR 17X24 6PK STRL BLUE (TOWEL DISPOSABLE) ×2 IMPLANT
TOWEL OR 17X26 10 PK STRL BLUE (TOWEL DISPOSABLE) ×2 IMPLANT
TRAY ENT MC OR (CUSTOM PROCEDURE TRAY) ×2 IMPLANT
WATER STERILE IRR 1000ML POUR (IV SOLUTION) ×2 IMPLANT

## 2012-09-07 NOTE — Progress Notes (Signed)
UR completed 

## 2012-09-07 NOTE — Progress Notes (Signed)
S: Feeling some soreness at surgical site but otherwise doing well.  She had some left-sided nose bleeding. O: AFVSS, alert and oriented, left facial movement normal, left parotid incision clean and intact with no fluid collection, drain functioning well. A: S/P left parotidectomy P: Observe overnight with drain in place.  KVO IVF.

## 2012-09-07 NOTE — Anesthesia Postprocedure Evaluation (Signed)
  Anesthesia Post-op Note  Patient: [name]  Procedure(s) Performed: Procedure(s) (LRB) with comments: PAROTIDECTOMY (Left) - LEFT PAROTIDECTOMY  Patient Location: PACU  Anesthesia Type:General  Level of Consciousness: awake, oriented, sedated and patient cooperative  Airway and Oxygen Therapy: Patient Spontanous Breathing and Patient connected to nasal cannula oxygen  Post-op Pain: mild  Post-op Assessment: Post-op Vital signs reviewed, Patient's Cardiovascular Status Stable, Respiratory Function Stable, Patent Airway, No signs of Nausea or vomiting and Pain level controlled  Post-op Vital Signs: stable  Complications: No apparent anesthesia complications

## 2012-09-07 NOTE — Anesthesia Procedure Notes (Signed)
Procedure Name: Intubation Date/Time: 09/07/2012 8:20 AM Performed by: Mariea Clonts Pre-anesthesia Checklist: Patient identified, Timeout performed, Emergency Drugs available, Suction available and Patient being monitored Patient Re-evaluated:Patient Re-evaluated prior to inductionOxygen Delivery Method: Circle system utilized Preoxygenation: Pre-oxygenation with 100% oxygen Intubation Type: IV induction Ventilation: Mask ventilation throughout procedure, Mask ventilation without difficulty, Nasal airway inserted- appropriate to patient size, Oral airway inserted - appropriate to patient size and Two handed mask ventilation required Grade View: Grade IV Nasal Tubes: Left Tube size: 7.5 mm Number of attempts: 5 or more Airway Equipment and Method: Patient positioned with wedge pillow and Fiberoptic brochoscope Placement Confirmation: ETT inserted through vocal cords under direct vision,  breath sounds checked- equal and bilateral and positive ETCO2 Tube secured with: Tape Dental Injury: Teeth and Oropharynx as per pre-operative assessment

## 2012-09-07 NOTE — Anesthesia Preprocedure Evaluation (Addendum)
Anesthesia Evaluation  Patient identified by MRN, date of birth, ID band Patient awake    Reviewed: Allergy & Precautions, H&P , NPO status , Patient's Chart, lab work & pertinent test results  Airway Mallampati: I TM Distance: >3 FB Neck ROM: full    Dental   Pulmonary sleep apnea ,          Cardiovascular hypertension, + CAD Rhythm:regular Rate:Normal     Neuro/Psych    GI/Hepatic GERD-  ,  Endo/Other  diabetes, Type 2, Insulin Dependent  Renal/GU      Musculoskeletal   Abdominal   Peds  Hematology   Anesthesia Other Findings   Reproductive/Obstetrics                           Anesthesia Physical Anesthesia Plan  ASA: III  Anesthesia Plan: General   Post-op Pain Management:    Induction: Intravenous  Airway Management Planned: Oral ETT  Additional Equipment:   Intra-op Plan:   Post-operative Plan: Extubation in OR  Informed Consent: I have reviewed the patients History and Physical, chart, labs and discussed the procedure including the risks, benefits and alternatives for the proposed anesthesia with the patient or authorized representative who has indicated his/her understanding and acceptance.     Plan Discussed with: CRNA, Anesthesiologist and Surgeon  Anesthesia Plan Comments:         Anesthesia Quick Evaluation

## 2012-09-07 NOTE — Op Note (Signed)
NAMETHELIA, GILSDORF NO.:  [id number]  MEDICAL RECORD NO.:  [id number]  LOCATION:  6N05C                        FACILITY:  [facility]  PHYSICIAN:  Onnie Graham, MD     DATE OF BIRTH:  05-29-1951  DATE OF PROCEDURE:  09/07/2012 DATE OF DISCHARGE:                              OPERATIVE REPORT   PREOPERATIVE DIAGNOSIS:  Left parotid gland mass.  POSTOPERATIVE DIAGNOSIS:  Left parotid gland mass.  PROCEDURE:  Left lateral parotidectomy with nerve dissection.  SURGEON:  Onnie Graham, MD  ASSISTANT:  Jolene Provost, PA-C  ANESTHESIA:  General endotracheal anesthesia.  COMPLICATIONS:  None.  INDICATION:  The patient is a 62 year old female, who has noticed a mass in the left upper neck for the past year that has grown a bit over time. A needle biopsy suggested a parotid neoplasm, so she presents to the operating room for surgical management.  FINDINGS:  A mass was found in the inferior parotid gland inferior and lateral to the marginal mandibular branch of the facial nerve.  With removal of the tumor initially, the dissection proceeded very close to the tumor, so an additional margin of parotid tissue was removed superior to the tumor and sent separately for pathology.  DESCRIPTION OF PROCEDURE:  The patient was identified in the holding room, informed consent having been obtained including discussion of risks, benefits, alternatives, the patient was brought to the operative suite, put on the operating table in supine position.  Anesthesia was induced and the patient was intubated via nasotracheal intubation because of much difficulty intubating through the mouth due to small mouth and excessive soft tissue.  Passage of the fiberoptic scope through the nose and into the airway was performed by me.  After the airway was established, the incision was marked with a marking pen for the left parotidectomy and was injected with local anesthetic.  The nerve  integrity monitor was hooked up to the patient in standard fashion for monitoring facial nerve on the left side.  This was turned on for the case.  The left face and neck were prepped and draped in sterile fashion.  Incision was made with a 15 blade scalpel through the skin and extended through the subcutaneous layer using Bovie electrocautery.  A preparotid flap was elevated anteriorly and the earlobe was dissected posteriorly.  The flap in the earlobe were then sutured back with 2-0 silk suture.  Dissection was then carefully performed along the tragal cartilage on the tympanic ring down to the stylomastoid foramen where the nerve was identified and then dissected in an antegrade fashion. First, the inferior division was traced and overlying parotid gland divided using bipolar electrocautery and scissors.  This was continued along the marginal mandibular branch over the superior surface of the tumor.  As mentioned above, dissection proceeded very close to the tumor at this time.  Dissection was then carried along the marginal mandibular branch until the tumor was passed.  Dissection was then performed deep to the specimen down to the sternocleidomastoid muscle and the gland with the tumor was dissected off of the deep tissues and removed keeping the nerve anterior and superior.  This was passed to nursing  for pathology.  At this point, the decision was made to supply an additional margin of tissue superior to the mass and so the facial nerve was further dissected in an antegrade fashion following the superior division a little bit and then dissecting a layer of gland tissue and dividing it from the superior portion of the gland.  Continued dissection of the inferior division was required in order to remove this part of the gland tissue safely.  Ultimately, the margin was removed and passed to nursing for pathology.  At this point, bleeding was controlled with bipolar electrocautery.  The  wound was copiously irrigated with saline and the nerve stimulated with irrigation.  The patient was given Valsalva and no additional bleeding was seen.  A 7-French round drain was placed in the depth of the wound and secured the inferior extent of the incision using 2-0 nylon suture.  The wound was then closed in the subcutaneous layer using 3-0 Vicryl suture in a simple running fashion and then the skin layer using 5-0 nylon in a simple running fashion. The drain was hooked to suction during closure and was then attached to the left shoulder with tape.  Antibiotic ointment was added to the incision.  The patient returned to anesthesia for wake up, was extubated in the recovery room in stable condition.     Onnie Graham, MD     DDB/MEDQ  D:  09/07/2012  T:  09/07/2012  Job:  [id number]

## 2012-09-07 NOTE — Progress Notes (Signed)
Patient admit to [facility] at 1320.  Patient alert and oriented, family at the bedside.  Report received from Arnoldo Hooker, RN.

## 2012-09-07 NOTE — Transfer of Care (Signed)
Immediate Anesthesia Transfer of Care Note  Patient: [name]  Procedure(s) Performed: Procedure(s) (LRB) with comments: PAROTIDECTOMY (Left) - LEFT PAROTIDECTOMY  Patient Location: PACU  Anesthesia Type:General  Level of Consciousness: awake, alert  and oriented  Airway & Oxygen Therapy: Patient Spontanous Breathing and Patient connected to face mask oxygen  Post-op Assessment: Report given to PACU RN and Post -op Vital signs reviewed and stable  Post vital signs: Reviewed and stable  Complications: No apparent anesthesia complications

## 2012-09-07 NOTE — H&P (Signed)
[name] is an 62 y.o. female.   Chief Complaint: Neck mass HPI: 62 year old female with 2 cm mass under the left ear that has been present for about a year.  FNA suggests benign parotid neoplasm.  Recently diagnosed with OSA and trying CPAP.  Past Medical History  Diagnosis Date  . 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 Hyperlipidemia   . Cystocele   . IBS (irritable bowel syndrome)   . Varicose veins   . Superficial phlebitis   . Esophageal stricture   . Fatty liver   . Osteopenia   . Vitamin D deficiency   . Effusion, pericardium   . Dyslipidemia   . GERD (gastroesophageal reflux disease)   . Osteoarthritis   . Pericardial effusion   . Sleep apnea     CPAP  . Hypertension     dr Percival Spanish  . Obesity   . CAD (coronary artery disease)     Nonobstructive by 2006 cath    Past Surgical History  Procedure Date  . Tonsillectomy 1968  . Cholecystectomy 1982  . Carpal tunnel release     Left  . Tubal ligation   . Cataract extraction   . Cardiac catheterization     06    Family History  Problem Relation Age of Onset  . Heart failure Mother   . Hypertension Mother   . Skin cancer Mother   . Colitis Mother   . Diabetes Mother   . Heart disease Father   . Colon polyps Father   . Diabetes Father   . Diabetes Other    Social History:  reports that she has never smoked. She has never used smokeless tobacco. She reports that she does not drink alcohol or use illicit drugs.  Allergies:  Allergies  Allergen Reactions  . Ezetimibe-Simvastatin Other (See Comments)    Leg cramps  . Penicillins Other (See Comments)    fainted  . Levemir (Insulin Detemir) Rash    Medications Prior to Admission  Medication Sig Dispense Refill  . aspirin 325 MG tablet Take 325 mg by mouth daily.        . Canagliflozin (INVOKANA) 100 MG TABS Take 100 mg by mouth daily.       Marland Kitchen esomeprazole (NEXIUM) 40 MG capsule Take 40 mg by mouth daily.        . insulin  aspart (NOVOLOG) 100 UNIT/ML injection Inject 50-58 Units into the skin 3 (three) times daily before meals. Based off of the cbg readings      . insulin glargine (LANTUS) 100 UNIT/ML injection Inject 100 Units into the skin 2 (two) times daily.       . metFORMIN (GLUCOPHAGE) 1000 MG tablet Take 1,000 mg by mouth 2 (two) times daily with a meal.      . metoprolol (LOPRESSOR) 50 MG tablet Take 1.5 tablets (75 mg total) by mouth 2 (two) times daily.  90 tablet  11  . olmesartan-hydrochlorothiazide (BENICAR HCT) 40-25 MG per tablet Take 1 tablet by mouth daily.      Marland Kitchen OVER THE COUNTER MEDICATION Mag/ calicum/vitaminD  Tablet 1 daily      . Pitavastatin Calcium 4 MG TABS Take 4 mg by mouth daily.      . hyoscyamine (LEVSIN/SL) 0.125 MG SL tablet Place 1 tablet (0.125 mg total) under the tongue every 4 (four) hours as needed for cramping.  60 tablet  11    No results  found for this or any previous visit (from the past 48 hour(s)). No results found.  Review of Systems  Musculoskeletal:       Toenail is hurting.  Skin:       Dry skin on legs.  All other systems reviewed and are negative.    Blood pressure 134/77, pulse 83, temperature 97.6 F (36.4 C), temperature source Oral, resp. rate 20, height 5\' 8"  (1.727 m), weight 117.935 kg (260 lb), SpO2 93.00%. Physical Exam  Constitutional: She is oriented to person, place, and time. She appears well-developed and well-nourished. No distress.  HENT:  Head: Normocephalic and atraumatic.  Right Ear: External ear normal.  Left Ear: External ear normal.  Nose: Nose normal.  Mouth/Throat: Oropharynx is clear and moist.  Eyes: Conjunctivae normal and EOM are normal. Pupils are equal, round, and reactive to light.  Neck: Normal range of motion. Neck supple.       Left parotid tail with 2 cm firm mass just inferior to earlobe.  Cardiovascular: Normal rate.   Respiratory: Effort normal.  GI:       Did not examine.  Genitourinary:       Did not  examine.  Musculoskeletal: Normal range of motion.  Neurological: She is alert and oriented to person, place, and time. No cranial nerve deficit.  Skin: Skin is warm and dry.  Psychiatric: She has a normal mood and affect. Her behavior is normal. Judgment and thought content normal.     Assessment/Plan Left parotid neoplasm To OR for left parotidectomy.  Plan observation overnight with drain in place.  [name] 09/07/2012, 7:33 AM

## 2012-09-07 NOTE — Brief Op Note (Signed)
09/07/2012  10:09 AM  PATIENT:  [name]  62 y.o. female  PRE-OPERATIVE DIAGNOSIS:  PAROTID MASS  POST-OPERATIVE DIAGNOSIS:  PAROTID MASS  PROCEDURE:  Procedure(s) (LRB) with comments: PAROTIDECTOMY (Left) - LEFT PAROTIDECTOMY  SURGEON:  Surgeon(s) and Role:    * Melida Quitter, MD - Primary  PHYSICIAN ASSISTANT: Nordbladh  ASSISTANTS: none   ANESTHESIA:   general  EBL:  Total I/O In: 1000 [I.V.:1000] Out: -   BLOOD ADMINISTERED:none  DRAINS: (7 Fr) Jackson-Pratt drain(s) with closed bulb suction in the left neck   LOCAL MEDICATIONS USED:  LIDOCAINE   SPECIMEN:  Source of Specimen:  Left parotid mass and additional margin  DISPOSITION OF SPECIMEN:  PATHOLOGY  COUNTS:  YES  TOURNIQUET:  * No tourniquets in log *  DICTATION: .Other Dictation: Dictation Number [id number]  PLAN OF CARE: Admit for overnight observation  PATIENT DISPOSITION:  PACU - hemodynamically stable.   Delay start of Pharmacological VTE agent (>24hrs) due to surgical blood loss or risk of bleeding: yes

## 2012-09-07 NOTE — Preoperative (Signed)
Beta Blockers   Reason not to administer Beta Blockers:took lopressor this am

## 2012-09-08 MED ORDER — HYDROCODONE-ACETAMINOPHEN 5-325 MG PO TABS: 1.0000 | ORAL_TABLET | ORAL | Status: DC | PRN

## 2012-09-08 NOTE — Discharge Summary (Signed)
Physician Discharge Summary  Patient ID: [name] MRN: [id number] DOB/AGE: 21-May-1951 62 y.o.  Admit date: 09/07/2012 Discharge date: 09/08/2012  Admission Diagnoses: Left parotid neoplasm, sleep apnea, diabetes, obesity  Discharge Diagnoses: Same   Discharged Condition: good  Hospital Course: 62 year old female with one year history of left parotid mass.  FNA showed benign neoplasm and she presented for surgical removal.  For details of surgery, see operative note.  She was observed overnight after surgery with a drain in place.  She did quite well except for elevated blood sugar that was managed with a sliding scale.  She has had throat clearing and cough likely related to her difficult intubation.  She has had some incisional soreness.  Otherwise, she is doing well and was felt stable for discharge on POD #1.  Consults: None  Significant Diagnostic Studies: None  Treatments: surgery: Left parotidectomy with facial nerve dissection  Discharge Exam: Blood pressure 119/60, pulse 89, temperature 98.2 F (36.8 C), temperature source Oral, resp. rate 16, height 5\' 8"  (1.727 m), weight 118.933 kg (262 lb 3.2 oz), SpO2 96.00%. General appearance: alert, cooperative and no distress Neck: Left parotid incision clean and intact, no fluid collection.  Drain removed.  Left facial movement normal.  Disposition: 01-Home or Self Care  Discharge Orders    Future Orders Please Complete By Expires   Diet - low sodium heart healthy      Increase activity slowly      Discharge instructions      Comments:   Clean incision twice daily with half strength peroxide and apply Bacitracin ointment.  Do not bathe incision until tomorrow, then gently pat dry after incision gets wet.  Limit activity to no heavy exertion.  Keep diet bland, no sour or spicy.       Medication List     As of 09/08/2012  9:43 AM    TAKE these medications         aspirin 325 MG tablet   Take 325 mg by mouth daily.        esomeprazole 40 MG capsule   Commonly known as: NEXIUM   Take 40 mg by mouth daily.      HYDROcodone-acetaminophen 5-325 MG per tablet   Commonly known as: NORCO/VICODIN   Take 1-2 tablets by mouth every 4 (four) hours as needed.      hyoscyamine 0.125 MG SL tablet   Commonly known as: LEVSIN SL   Place 1 tablet (0.125 mg total) under the tongue every 4 (four) hours as needed for cramping.      insulin aspart 100 UNIT/ML injection   Commonly known as: novoLOG   Inject 50-58 Units into the skin 3 (three) times daily before meals. Based off of the cbg readings      insulin glargine 100 UNIT/ML injection   Commonly known as: LANTUS   Inject 100 Units into the skin 2 (two) times daily.      INVOKANA 100 MG Tabs   Generic drug: Canagliflozin   Take 100 mg by mouth daily.      metFORMIN 1000 MG tablet   Commonly known as: GLUCOPHAGE   Take 1,000 mg by mouth 2 (two) times daily with a meal.      metoprolol 50 MG tablet   Commonly known as: LOPRESSOR   Take 1.5 tablets (75 mg total) by mouth 2 (two) times daily.      olmesartan-hydrochlorothiazide 40-25 MG per tablet   Commonly known as: [employer]  HCT   Take 1 tablet by mouth daily.      OVER THE COUNTER MEDICATION   Mag/ calicum/vitaminD  Tablet 1 daily      Pitavastatin Calcium 4 MG Tabs   Take 4 mg by mouth daily.           Follow-up Information    Follow up with Mohogany Toppins, MD. Schedule an appointment as soon as possible for a visit in 1 week.   Contact information:   [address] [place] [address] [phone]          Signed: Melida Quitter 09/08/2012, 9:43 AM

## 2012-09-10 ENCOUNTER — Encounter (HOSPITAL_COMMUNITY): Payer: Self-pay | Admitting: Otolaryngology

## 2012-09-10 LAB — GLUCOSE, CAPILLARY: Glucose-Capillary: 269 mg/dL — ABNORMAL HIGH (ref 70–99)

## 2012-09-11 LAB — GLUCOSE, CAPILLARY: Glucose-Capillary: 219 mg/dL — ABNORMAL HIGH (ref 70–99)

## 2012-10-30 ENCOUNTER — Other Ambulatory Visit: Payer: Self-pay | Admitting: Pharmacist

## 2012-10-31 ENCOUNTER — Other Ambulatory Visit (INDEPENDENT_AMBULATORY_CARE_PROVIDER_SITE_OTHER): Payer: PRIVATE HEALTH INSURANCE

## 2012-10-31 ENCOUNTER — Other Ambulatory Visit: Payer: Self-pay | Admitting: *Deleted

## 2012-10-31 DIAGNOSIS — Z1212 Encounter for screening for malignant neoplasm of rectum: Secondary | ICD-10-CM

## 2012-10-31 MED ORDER — INSULIN ASPART 100 UNIT/ML ~~LOC~~ SOLN
50.0000 [IU] | Freq: Three times a day (TID) | SUBCUTANEOUS | Status: DC
Start: 2012-10-31 — End: 2013-08-01

## 2012-10-31 NOTE — Progress Notes (Signed)
Pt dropped off fobt 

## 2012-11-01 LAB — FECAL OCCULT BLOOD, IMMUNOCHEMICAL: Fecal Occult Blood: POSITIVE — AB

## 2012-11-07 ENCOUNTER — Other Ambulatory Visit: Payer: PRIVATE HEALTH INSURANCE

## 2012-11-12 ENCOUNTER — Other Ambulatory Visit: Payer: Self-pay

## 2012-11-13 ENCOUNTER — Telehealth: Payer: Self-pay | Admitting: Pharmacist

## 2012-11-13 NOTE — Telephone Encounter (Signed)
Cancelled appt for 4/10 and kept appt for 4/14. Pt aware

## 2012-11-20 ENCOUNTER — Other Ambulatory Visit: Payer: PRIVATE HEALTH INSURANCE

## 2012-11-20 ENCOUNTER — Ambulatory Visit (INDEPENDENT_AMBULATORY_CARE_PROVIDER_SITE_OTHER): Payer: PRIVATE HEALTH INSURANCE | Admitting: Nurse Practitioner

## 2012-11-20 VITALS — BP 148/62 | HR 84 | Temp 99.5°F | Ht 67.0 in | Wt 262.0 lb

## 2012-11-20 DIAGNOSIS — R062 Wheezing: Secondary | ICD-10-CM

## 2012-11-20 DIAGNOSIS — R7989 Other specified abnormal findings of blood chemistry: Secondary | ICD-10-CM

## 2012-11-20 DIAGNOSIS — Z1212 Encounter for screening for malignant neoplasm of rectum: Secondary | ICD-10-CM

## 2012-11-20 DIAGNOSIS — J209 Acute bronchitis, unspecified: Secondary | ICD-10-CM

## 2012-11-20 LAB — POCT CBC
Granulocyte percent: 76.9 %G (ref 37–80)
HCT, POC: 43.1 % (ref 37.7–47.9)
Hemoglobin: 14 g/dL (ref 12.2–16.2)
Lymph, poc: 1.4 (ref 0.6–3.4)
MCH, POC: 27.8 pg (ref 27–31.2)
MCHC: 32.4 g/dL (ref 31.8–35.4)
MCV: 85.6 fL (ref 80–97)
MPV: 7.8 fL (ref 0–99.8)
POC Granulocyte: 5.5 (ref 2–6.9)
POC LYMPH PERCENT: 19.9 %L (ref 10–50)
Platelet Count, POC: 226 10*3/uL (ref 142–424)
RBC: 5 M/uL (ref 4.04–5.48)
RDW, POC: 14.9 %
WBC: 7.1 10*3/uL (ref 4.6–10.2)

## 2012-11-20 LAB — MAGNESIUM: Magnesium: 1.4 mg/dL — ABNORMAL LOW (ref 1.5–2.5)

## 2012-11-20 MED ORDER — ALBUTEROL SULFATE HFA 108 (90 BASE) MCG/ACT IN AERS: 2.0000 | INHALATION_SPRAY | Freq: Four times a day (QID) | RESPIRATORY_TRACT | Status: DC | PRN

## 2012-11-20 MED ORDER — HYDROCODONE-HOMATROPINE 5-1.5 MG/5ML PO SYRP: 5.0000 mL | ORAL_SOLUTION | Freq: Three times a day (TID) | ORAL | Status: DC | PRN

## 2012-11-20 MED ORDER — CIPROFLOXACIN HCL 500 MG PO TABS: 500.0000 mg | ORAL_TABLET | Freq: Two times a day (BID) | ORAL | Status: DC

## 2012-11-20 MED ORDER — LEVALBUTEROL HCL 1.25 MG/0.5ML IN NEBU
1.2500 mg | INHALATION_SOLUTION | Freq: Once | RESPIRATORY_TRACT | Status: AC
  Administered 2012-11-20: 1.25 mg via RESPIRATORY_TRACT

## 2012-11-20 NOTE — Progress Notes (Signed)
PT CAME IN FOR LABS ONLY 

## 2012-11-20 NOTE — Progress Notes (Signed)
  Subjective:    Patient ID: [name], female    DOB: [birth year], 62 y.o.   MRN: [id number]  HPI Patient in complaining of Cough . Started 2day. Has gotten worse since started. Associated symptoms include congestion and cant lay down to sleep because makes cough worse. He has tried Mucinex OTC without relief.     Review of Systems  Constitutional: Positive for fever. Negative for chills.  HENT: Positive for congestion, rhinorrhea and sinus pressure.   Eyes: Negative.   Respiratory: Positive for cough (productive) and shortness of breath. Negative for wheezing.   Cardiovascular: Negative.   Gastrointestinal: Negative.   Skin: Negative.   Psychiatric/Behavioral: Negative.        Objective:   Physical Exam  Constitutional: She is oriented to person, place, and time. She appears well-developed and well-nourished.  obese  HENT:  Head: Normocephalic.  Right Ear: External ear normal.  Left Ear: External ear normal.  Nose: Mucosal edema and rhinorrhea present. Right sinus exhibits no maxillary sinus tenderness and no frontal sinus tenderness. Left sinus exhibits no maxillary sinus tenderness and no frontal sinus tenderness.  Mouth/Throat: Oropharynx is clear and moist and mucous membranes are normal.  Neck: Normal range of motion.  Cardiovascular: Normal rate, regular rhythm, normal heart sounds and intact distal pulses.   No murmur heard. Pulmonary/Chest: Effort normal. She has wheezes (tight insp wheezes throughout). She has no rales.  Abdominal: Soft. Bowel sounds are normal.  Musculoskeletal: Normal range of motion.  Lymphadenopathy:    She has no cervical adenopathy.  Neurological: She is alert and oriented to person, place, and time.  Skin: Skin is warm and dry.  Psychiatric: She has a normal mood and affect. Her behavior is normal. Judgment and thought content normal.   S/P Xopenex Neb- Looser cough and no Insp wheezes noted   BP 148/62  Pulse 84  Temp(Src) 99.5  F (37.5 C) (Oral)  Ht 5\' 7"  (1.702 m)  Wt 262 lb (118.842 kg)  BMI 41.03 kg/m2     Assessment & Plan:  1. Wheezing - levalbuterol (XOPENEX) nebulizer solution 1.25 mg; Take 1.25 mg by nebulization once.  2. Acute bronchitis 1. Take meds as prescribed 2. Use a cool mist humidifier especially during the winter months and when heat has  been humid. 3. Use saline nose sprays frequently 4. Saline irrigations of the nose can be very helpful if done frequently.  * 4X daily for 1 week*  * Use of a nettie pot can be helpful with this. Follow directions with this* 5. Drink plenty of fluids 6. Keep thermostat turn down low 7.For any cough or congestion  Use plain Mucinex- regular strength or max strength is fine   * Children- consult with Pharmacist for dosing 8. For fever or aces or pains- take tylenol or ibuprofen appropriate for age and weight.  * for fevers greater than 101 orally you may alternate ibuprofen and tylenol every  3 hours.   - ciprofloxacin (CIPRO) 500 MG tablet; Take 1 tablet (500 mg total) by mouth 2 (two) times daily.  Dispense: 20 tablet; Refill: 0 - HYDROcodone-homatropine (HYCODAN) 5-1.5 MG/5ML syrup; Take 5 mLs by mouth every 8 (eight) hours as needed for cough.  Dispense: 120 mL; Refill: 0 - albuterol (PROVENTIL HFA;VENTOLIN HFA) 108 (90 BASE) MCG/ACT inhaler; Inhale 2 puffs into the lungs every 6 (six) hours as needed for wheezing.  Dispense: 1 Inhaler; Refill: 0 Mary-Margaret Hassell Done, FNP

## 2012-11-20 NOTE — Patient Instructions (Signed)

## 2012-11-21 ENCOUNTER — Telehealth: Payer: Self-pay | Admitting: Pharmacist

## 2012-11-21 LAB — FECAL OCCULT BLOOD, IMMUNOCHEMICAL: Fecal Occult Blood: NEGATIVE

## 2012-11-21 NOTE — Telephone Encounter (Signed)
Patient was notified of CBC and Magnesium results. CBC was WNL but magnesium was still slightly low - but better.  Patient will continue magnesium 500mg  qd and will recheck magnesium at appt 11/26/12.

## 2012-11-22 ENCOUNTER — Ambulatory Visit: Payer: Self-pay

## 2012-11-22 ENCOUNTER — Other Ambulatory Visit: Payer: Self-pay | Admitting: Family Medicine

## 2012-11-24 ENCOUNTER — Other Ambulatory Visit: Payer: Self-pay | Admitting: Nurse Practitioner

## 2012-11-24 NOTE — Progress Notes (Signed)
Tammy please review

## 2012-11-26 ENCOUNTER — Ambulatory Visit: Payer: Self-pay

## 2012-11-28 ENCOUNTER — Telehealth: Payer: Self-pay | Admitting: Pharmacist

## 2012-11-28 NOTE — Telephone Encounter (Signed)
Message copied by Cherre Robins on Wed Nov 28, 2012  8:54 AM ------      Message from: Marin Olp      Created: Sat Nov 24, 2012  5:02 PM                   ----- Message -----         From: Chipper Herb, MD         Sent: 11/20/2012   5:01 PM           To: Renaye Rakers [place]            Magnesium slightly decreased      Send to provider that ordered this ------

## 2012-11-28 NOTE — Telephone Encounter (Signed)
Magnesium is slightly low but improved from 1.3 on 10/24/2012.   Recommend pt continue magnesium 500mg  daily and recheck labs at next appt 12/04/2012.

## 2012-12-04 ENCOUNTER — Ambulatory Visit (INDEPENDENT_AMBULATORY_CARE_PROVIDER_SITE_OTHER): Payer: PRIVATE HEALTH INSURANCE | Admitting: Pharmacist Clinician (PhC)/ Clinical Pharmacy Specialist

## 2012-12-04 ENCOUNTER — Other Ambulatory Visit: Payer: Self-pay | Admitting: Family Medicine

## 2012-12-04 LAB — MAGNESIUM: Magnesium: 1.6 mg/dL (ref 1.5–2.5)

## 2012-12-04 NOTE — Progress Notes (Signed)
  Subjective:    Patient ID: [name], female    DOB: [birth year], 62 y.o.   MRN: [id number]  HPI  Long standing poorly controlled type 2 diabetes mellitus    Review of Systems  Constitutional: Negative.   HENT: Negative.   Eyes: Negative.   Respiratory: Negative.   Cardiovascular: Negative.   Gastrointestinal: Positive for abdominal distention.  Endocrine: Negative.   Genitourinary: Negative.   Neurological: Negative.   Hematological: Negative.   Psychiatric/Behavioral: Negative.        Objective:   Physical Exam  Constitutional: She appears well-developed and well-nourished.  Cardiovascular: Normal rate, regular rhythm, normal heart sounds and intact distal pulses.   No murmur heard. Abdominal: She exhibits distension.  Skin: Skin is warm and dry.  Psychiatric: She has a normal mood and affect. Her behavior is normal. Judgment and thought content normal.          Assessment & Plan:   Diabetes Follow-Up Visit Chief Complaint:  No chief complaint on file.    Exam Regularity:  RRR Edema:  pos  Polyuria:  pos  Polydipsia:  neg Polyphagia:  neg  BMI:  There is no weight on file to calculate BMI.   Weight changes:  Stable and obese General Appearance:  obese Mood/Affect:  normal  HPI:  Type 2 diabetes mellitus  Low fat/carbohydrate diet?  No Nicotine Abuse?  No Medication Compliance?  Yes Exercise?  No Alcohol Abuse?  No  Home BG Monitoring:  Checking 4 times a day. Average:  225  High: 276  Low:  160   No results found for this basename: HGBA1C    No results found for this basename: MICROALBUR, [id number]    No results found for this basename: CHOL, HDL, LDLCALC, LDLDIRECT, TRIG, CHOLHDL      Assessment: 1.  Diabetes.  Poor Control 2.  Blood Pressure.  At goal 3.  Lipids.  LDL-P elevation above goal of <1000 4.  Foot Care.  excellent 5.  Dental Care.  excellent 6.  Eye Care/Exam.  annual  Recommendations: 1.  Patient is counseled on  appropriate foot care. 2.  BP goal < 130/80. 3.  LDL goal of < 100, HDL > 40 and TG < 150. 4.  Eye Exam yearly and Dental Exam every 6 months. 5.  Dietary recommendations:  1500 cal ADA diet 6.  Physical Activity recommendations:  15 minutes a day as tolerated 7.  Medication recommendations at this time are as follows:  Continue same for now 8.  Return to clinic in 4-6 wks to see Dr. Laurance Flatten. 9.  Patient exploring gastric bypass surgery with Dr. Lucia Gaskins   Time spent counseling patient:  6  Physician time spent with patient:  0 Referring provider:  moore   PharmD:  [facility] Pharmacist

## 2012-12-11 ENCOUNTER — Telehealth: Payer: Self-pay | Admitting: Pharmacist Clinician (PhC)/ Clinical Pharmacy Specialist

## 2012-12-11 NOTE — Telephone Encounter (Signed)
Called patient with normal magnesium level results.  She will continue taking magnesium since low normal reading and re-check in 3-4 weeks.  [name]

## 2012-12-25 ENCOUNTER — Other Ambulatory Visit: Payer: Self-pay | Admitting: Family Medicine

## 2013-01-03 ENCOUNTER — Other Ambulatory Visit: Payer: Self-pay | Admitting: Nurse Practitioner

## 2013-01-14 ENCOUNTER — Other Ambulatory Visit: Payer: Self-pay | Admitting: Family Medicine

## 2013-01-24 ENCOUNTER — Ambulatory Visit (INDEPENDENT_AMBULATORY_CARE_PROVIDER_SITE_OTHER): Payer: PRIVATE HEALTH INSURANCE | Admitting: Family Medicine

## 2013-01-24 ENCOUNTER — Other Ambulatory Visit: Payer: Self-pay | Admitting: Family Medicine

## 2013-01-24 ENCOUNTER — Encounter: Payer: Self-pay | Admitting: Family Medicine

## 2013-01-24 VITALS — BP 155/75 | HR 97 | Temp 97.5°F | Ht 67.5 in | Wt 252.0 lb

## 2013-01-24 DIAGNOSIS — I10 Essential (primary) hypertension: Secondary | ICD-10-CM

## 2013-01-24 DIAGNOSIS — E559 Vitamin D deficiency, unspecified: Secondary | ICD-10-CM

## 2013-01-24 DIAGNOSIS — E785 Hyperlipidemia, unspecified: Secondary | ICD-10-CM

## 2013-01-24 DIAGNOSIS — K589 Irritable bowel syndrome without diarrhea: Secondary | ICD-10-CM

## 2013-01-24 DIAGNOSIS — M949 Disorder of cartilage, unspecified: Secondary | ICD-10-CM

## 2013-01-24 DIAGNOSIS — R350 Frequency of micturition: Secondary | ICD-10-CM

## 2013-01-24 DIAGNOSIS — R5383 Other fatigue: Secondary | ICD-10-CM

## 2013-01-24 DIAGNOSIS — M899 Disorder of bone, unspecified: Secondary | ICD-10-CM

## 2013-01-24 DIAGNOSIS — R5381 Other malaise: Secondary | ICD-10-CM

## 2013-01-24 DIAGNOSIS — M8580 Other specified disorders of bone density and structure, unspecified site: Secondary | ICD-10-CM

## 2013-01-24 DIAGNOSIS — E119 Type 2 diabetes mellitus without complications: Secondary | ICD-10-CM

## 2013-01-24 LAB — POCT URINALYSIS DIPSTICK
Bilirubin, UA: NEGATIVE
Blood, UA: NEGATIVE
Glucose, UA: 1000
Ketones, UA: NEGATIVE
Leukocytes, UA: NEGATIVE
Nitrite, UA: NEGATIVE
Spec Grav, UA: 1.02
Urobilinogen, UA: NEGATIVE
pH, UA: 5

## 2013-01-24 LAB — POCT CBC
Granulocyte percent: 66.7 %G (ref 37–80)
HCT, POC: 46.8 % (ref 37.7–47.9)
Hemoglobin: 15.7 g/dL (ref 12.2–16.2)
Lymph, poc: 2.8 (ref 0.6–3.4)
MCH, POC: 28.1 pg (ref 27–31.2)
MCHC: 33.5 g/dL (ref 31.8–35.4)
MCV: 83.8 fL (ref 80–97)
MPV: 8.7 fL (ref 0–99.8)
POC Granulocyte: 6.8 (ref 2–6.9)
POC LYMPH PERCENT: 27.5 %L (ref 10–50)
Platelet Count, POC: 234 10*3/uL (ref 142–424)
RBC: 5.6 M/uL — AB (ref 4.04–5.48)
RDW, POC: 14.4 %
WBC: 10.2 10*3/uL (ref 4.6–10.2)

## 2013-01-24 LAB — POCT UA - MICROSCOPIC ONLY
Casts, Ur, LPF, POC: NEGATIVE
Crystals, Ur, HPF, POC: NEGATIVE
RBC, urine, microscopic: NEGATIVE
WBC, Ur, HPF, POC: NEGATIVE

## 2013-01-24 LAB — POCT GLYCOSYLATED HEMOGLOBIN (HGB A1C): Hemoglobin A1C: 10.1

## 2013-01-24 LAB — BASIC METABOLIC PANEL WITH GFR
BUN: 25 mg/dL — ABNORMAL HIGH (ref 6–23)
CO2: 29 mEq/L (ref 19–32)
Calcium: 9.5 mg/dL (ref 8.4–10.5)
Chloride: 100 mEq/L (ref 96–112)
Creat: 1.04 mg/dL (ref 0.50–1.10)
GFR, Est African American: 67 mL/min
GFR, Est Non African American: 58 mL/min — ABNORMAL LOW
Glucose, Bld: 176 mg/dL — ABNORMAL HIGH (ref 70–99)
Potassium: 4.7 mEq/L (ref 3.5–5.3)
Sodium: 143 mEq/L (ref 135–145)

## 2013-01-24 LAB — THYROID PANEL WITH TSH
Free Thyroxine Index: 3.4 (ref 1.0–3.9)
T3 Uptake: 33.2 % (ref 22.5–37.0)
T4, Total: 10.3 ug/dL (ref 5.0–12.5)
TSH: 1.417 u[IU]/mL (ref 0.350–4.500)

## 2013-01-24 LAB — HEPATIC FUNCTION PANEL
ALT: 34 U/L (ref 0–35)
AST: 32 U/L (ref 0–37)
Albumin: 3.8 g/dL (ref 3.5–5.2)
Alkaline Phosphatase: 75 U/L (ref 39–117)
Bilirubin, Direct: 0.1 mg/dL (ref 0.0–0.3)
Indirect Bilirubin: 0.4 mg/dL (ref 0.0–0.9)
Total Bilirubin: 0.5 mg/dL (ref 0.3–1.2)
Total Protein: 6.7 g/dL (ref 6.0–8.3)

## 2013-01-24 MED ORDER — CANAGLIFLOZIN 100 MG PO TABS: 1.0000 | ORAL_TABLET | Freq: Every day | ORAL | Status: DC

## 2013-01-24 MED ORDER — PITAVASTATIN CALCIUM 4 MG PO TABS: 4.0000 mg | ORAL_TABLET | Freq: Every day | ORAL | Status: DC

## 2013-01-24 MED ORDER — METFORMIN HCL 1000 MG PO TABS: 1000.0000 mg | ORAL_TABLET | Freq: Two times a day (BID) | ORAL | Status: DC

## 2013-01-24 NOTE — Progress Notes (Signed)
Subjective:    Patient ID: [name], female    DOB: 08-Aug-1951, 62 y.o.   MRN: [id number]  HPI Patient comes in today for followup of her hypertension hyperlipidemia diabetes vitamin D deficiency. Most of the blood sugars are running 200 or less except for on a few occasions it may be above 200. This is better than the more recent past. She is having some congestion and drainage but she has not taken any medication for this. She has taken Claritin and Zyrtec in the past. She also has a lesion on her left leg she would like for [place] to look at today. She has had more urinary frequency recently. She also has some low abdominal pain. She is contemplating a gastric bypass. She would like to see Dr. Alphonsa Overall for an evaluation for possibly doing this.   Review of Systems  Constitutional: Positive for fatigue.  HENT: Positive for postnasal drip.   Eyes: Negative.   Respiratory: Positive for cough (occasional prod). Negative for shortness of breath.   Cardiovascular: Positive for palpitations and leg swelling (intermitently).  Gastrointestinal: Positive for nausea (occasional), diarrhea (intermititent) and constipation (intermitient). Negative for abdominal pain.  Genitourinary: Positive for dysuria (slight) and frequency.  Musculoskeletal: Positive for back pain (LBP) and arthralgias (L shoulder, R hip).  Skin: Positive for color change (keratosis) and wound (L lower leg).  Allergic/Immunologic: Positive for environmental allergies.  Neurological: Negative.   Hematological: Negative.   Psychiatric/Behavioral: Positive for sleep disturbance (nightly).       Objective:   Physical Exam BP 155/75  Pulse 97  Temp(Src) 97.5 F (36.4 C) (Oral)  Ht 5' 7.5" (1.715 m)  Wt 252 lb (114.306 kg)  BMI 38.86 kg/m2  The patient appeared well nourished and normally developed, much overweigh, alert and oriented to time and place. Speech, behavior and judgement appear normal. Vital signs as  documented.  Head exam is unremarkable. No scleral icterus or pallor noted. Ears nose and throat within normal limits Neck is without jugular venous distension, thyromegally, or carotid bruits. Carotid upstrokes are brisk bilaterally. No cervical adenopathy. Lungs are clear anteriorly and posteriorly to auscultation. Normal respiratory effort. Cardiac exam reveals regular rate and rhythm at 96 per minute. First and second heart sounds normal.  No murmurs, rubs or gallops.  Abdominal exam reveals normal bowl sounds, morbid obesity, no masses, no organomegaly and no aortic enlargement. No inguinal adenopathy. Extremities are nonedematous and both femoral and pedal pulses are normal. Skin without pallor or jaundice.  Warm and very dry, without rash. She has an area of folliculitis on the left posterior calf. Neurologic exam reveals normal deep tendon reflexes and normal sensation. Diabetic foot exam was done.         Assessment & Plan:  1. Hypomagnesemia    2. Diabetes mellitus, type 2 - BASIC METABOLIC PANEL WITH GFR; Standing - POCT glycosylated hemoglobin (Hb A1C); Standing - BASIC METABOLIC PANEL WITH GFR - POCT glycosylated hemoglobin (Hb A1C)  3. Hypertension - BASIC METABOLIC PANEL WITH GFR; Standing - BASIC METABOLIC PANEL WITH GFR  4. Hyperlipemia - Hepatic function panel; Standing - NMR Lipoprofile with Lipids; Standing - Hepatic function panel - NMR Lipoprofile with Lipids  5. Vitamin D deficiency - Vitamin D 25 hydroxy; Standing - Vitamin D 25 hydroxy  6. Osteopenia - Vitamin D 25 hydroxy; Standing - Vitamin D 25 hydroxy  7. IBS (irritable bowel syndrome)  8. Urinary frequency - POCT urinalysis dipstick - POCT UA - Microscopic Only  9. Fatigue - POCT CBC; Standing - Thyroid Panel With TSH - POCT CBC  10. Obesity, morbid Patient Instructions  Fall precautions discussed Continue current meds and therapeutic lifestyle changes Continue to monitor blood  sugar closely. We will arrange a visit with Dr. Alphonsa Overall. If you do not hear from [place] within a couple of weeks give [place] a call back and make sure this referral is moving forward.

## 2013-01-24 NOTE — Patient Instructions (Addendum)
Fall precautions discussed Continue current meds and therapeutic lifestyle changes Continue to monitor blood sugar closely. We will arrange a visit with Dr. Alphonsa Overall. If you do not hear from [place] within a couple of weeks give [place] a call back and make sure this referral is moving forward.

## 2013-01-25 LAB — NMR LIPOPROFILE WITH LIPIDS
Cholesterol, Total: 286 mg/dL — ABNORMAL HIGH (ref <200)
HDL Particle Number: 30 umol/L — ABNORMAL LOW (ref >=30.5)
HDL Size: 8.5 nm — ABNORMAL LOW (ref >=9.2)
HDL-C: 47 mg/dL (ref >=40)
LDL (calc): 145 mg/dL — ABNORMAL HIGH (ref <100)
LDL Particle Number: 2624 nmol/L — ABNORMAL HIGH (ref <1000)
LDL Size: 20.2 nm — ABNORMAL LOW (ref >20.5)
LP-IR Score: 87 — ABNORMAL HIGH (ref <=45)
Large HDL-P: 2.8 umol/L — ABNORMAL LOW (ref >=4.8)
Large VLDL-P: 24.6 nmol/L — ABNORMAL HIGH (ref <=2.7)
Small LDL Particle Number: 1858 nmol/L — ABNORMAL HIGH (ref <=527)
Triglycerides: 468 mg/dL — ABNORMAL HIGH (ref <150)
VLDL Size: 54.2 nm — ABNORMAL HIGH (ref <=46.6)

## 2013-01-25 LAB — VITAMIN D 25 HYDROXY (VIT D DEFICIENCY, FRACTURES): Vit D, 25-Hydroxy: 33 ng/mL (ref 30–89)

## 2013-02-06 ENCOUNTER — Ambulatory Visit (INDEPENDENT_AMBULATORY_CARE_PROVIDER_SITE_OTHER): Payer: PRIVATE HEALTH INSURANCE | Admitting: Pharmacist

## 2013-02-06 DIAGNOSIS — R635 Abnormal weight gain: Secondary | ICD-10-CM

## 2013-02-06 DIAGNOSIS — I10 Essential (primary) hypertension: Secondary | ICD-10-CM

## 2013-02-06 DIAGNOSIS — I2510 Atherosclerotic heart disease of native coronary artery without angina pectoris: Secondary | ICD-10-CM

## 2013-02-06 DIAGNOSIS — E785 Hyperlipidemia, unspecified: Secondary | ICD-10-CM

## 2013-02-06 DIAGNOSIS — E119 Type 2 diabetes mellitus without complications: Secondary | ICD-10-CM

## 2013-02-06 MED ORDER — FENOFIBRATE 48 MG PO TABS: 48.0000 mg | ORAL_TABLET | Freq: Every day | ORAL | Status: DC

## 2013-02-06 NOTE — Progress Notes (Signed)
Diabetes Follow-Up Visit Chief Complaint:   Chief Complaint  Patient presents with  . Hypertension  . Diabetes  . Hyperlipidemia     Filed Vitals:   02/06/13 1440  BP: 136/62  Pulse: 70    HPI: Patient has longstanding, poorly controlled type 2 diabetes and mixed dyslipidemia (elevated LDL-P and triglycerides.  She recently has labs drawn that showed worsening control.  She admits to poor compliance to both medications and diet.   Current Outpatient Prescriptions on File Prior to Visit  Medication Sig Dispense Refill  . aspirin 325 MG tablet Take 325 mg by mouth daily.        . B-D INS SYR ULTRAFINE 1CC/31G 31G X 5/16" 1 ML MISC USE 1 TWICE DAILY WITH NOVOLOG  100 each  1  . BD PEN NEEDLE NANO U/F 32G X 4 MM MISC USE TO INJECT NOVOLOG SUBCUTANEOUSLY 3 TIMES DAILY  100 each  2  . BENICAR HCT 40-25 MG per tablet Take 1 tablet by mouth daily.       . Canagliflozin (INVOKANA) 100 MG TABS Take 1 tablet (100 mg total) by mouth daily.  30 tablet  11  . Cholecalciferol (VITAMIN D) 2000 UNITS CAPS Take 1 capsule by mouth daily.      . insulin aspart (NOVOLOG) 100 UNIT/ML injection Inject 50-58 Units into the skin 3 (three) times daily before meals. Based off of the cbg readings  1 vial  2  . LANTUS 100 UNIT/ML injection INJECT SUBCUTANEOUSLY 100 UNITS TWICE DAILY AS DIRECTED  10 mL  1  . metFORMIN (GLUCOPHAGE) 1000 MG tablet Take 1 tablet (1,000 mg total) by mouth 2 (two) times daily with a meal.  60 tablet  11  . metoprolol (LOPRESSOR) 50 MG tablet Take 1.5 tablets (75 mg total) by mouth 2 (two) times daily.  90 tablet  11  . NEXIUM 40 MG capsule TAKE ONE CAPSULE BY MOUTH ONE TIME DAILY  30 capsule  5  . OVER THE COUNTER MEDICATION Take 1 tablet by mouth 2 (two) times daily. Mag/ calicum/vitaminD  Tablet 1 daily      . Pitavastatin Calcium 4 MG TABS Take 1 tablet (4 mg total) by mouth daily.  30 tablet  11  . albuterol (PROVENTIL HFA;VENTOLIN HFA) 108 (90 BASE) MCG/ACT inhaler Inhale 2 puffs  into the lungs every 6 (six) hours as needed for wheezing.  1 Inhaler  0  . hyoscyamine (LEVSIN/SL) 0.125 MG SL tablet Place 1 tablet (0.125 mg total) under the tongue every 4 (four) hours as needed for cramping.  60 tablet  11  . [DISCONTINUED] Olmesartan-Amlodipine-HCTZ (TRIBENZOR) 40-5-12.5 MG TABS Take 1 tablet by mouth daily.         No current facility-administered medications on file prior to visit.    Exam Edema:  negative  Polyuria:  positive  Polydipsia:  negative Polyphagia:  negative  BMI:  Body mass index is 39.33 kg/(m^2).   Weight changes:  Increase 4 lbs. General Appearance:  obese Mood/Affect:  normal   Low fat/carbohydrate diet?  No - admits to eating a Big mac in the middle of the night.  She states that when she follows a low carb diet her BG is in 150's to 170's, but after she is "bad", like when she had the Big Mac her BG is in high 200's to 300's. Nicotine Abuse?  No Medication Compliance?  No - admits to missing Lantus about once a week.  She also states that prior to  last LipoMed she had run out of Livalo for about 2 weeks.  Compliance problems are not related to financial issues Exercise?  No Alcohol Abuse?  No  Home BG Monitoring:  Checking 3 times a day. Average:  200's  High: 300's  Low:  130    Lab Results  Component Value Date   HGBA1C 10.1% 01/24/2013    No results found for this basename: Derl Barrow    Lab Results  Component Value Date   LDLCALC 145* 01/24/2013   TRIG 468* 01/24/2013      Assessment: 1.  Diabetes.  uncontrolled 2.  Blood Pressure.  At goal today 3.  Lipids.  LDL-P and Tg elevated   Recommendations: 1.  Medication recommendations at this time are as follows:    Add fenofibrate 42mg  daily  Continue all other medications  Discussed ways to improved compliance - put lantus with toothbrush for am and pm dosing.  Use pill box.  2.  Reviewed HBG goals:  Fasting 80-130 and 1-2 hour post prandial <180.  Patient is  instructed to check BG 3 times per day.    3.  BP goal < 140/80. 4.  LDL goal of < 100, HDL > 40 and TG < 150. 5.  Eye Exam yearly and Dental Exam every 6 months. 6.  Dietary recommendations:  Stressed importance of following ADA carb counting diet.  Patient is also interested in gastric bypass - referral to surgeon for evaluation.  7.  Physical Activity recommendations:  Encouraged to move more - walking with daughter, parking further away in parking lot and [employer].  9.  Return to clinic in 4-6 wks - recheck BG and lipids   Time spent counseling patient:  40 minutes  Referring provider:  Redge Gainer

## 2013-02-13 ENCOUNTER — Other Ambulatory Visit: Payer: Self-pay | Admitting: *Deleted

## 2013-02-14 ENCOUNTER — Other Ambulatory Visit: Payer: Self-pay | Admitting: Family Medicine

## 2013-02-17 ENCOUNTER — Other Ambulatory Visit: Payer: Self-pay | Admitting: Family Medicine

## 2013-02-25 ENCOUNTER — Other Ambulatory Visit: Payer: Self-pay

## 2013-02-26 ENCOUNTER — Encounter: Payer: Self-pay | Admitting: General Practice

## 2013-02-26 ENCOUNTER — Other Ambulatory Visit: Payer: PRIVATE HEALTH INSURANCE

## 2013-02-26 ENCOUNTER — Ambulatory Visit (INDEPENDENT_AMBULATORY_CARE_PROVIDER_SITE_OTHER): Payer: PRIVATE HEALTH INSURANCE | Admitting: General Practice

## 2013-02-26 VITALS — BP 145/71 | HR 102 | Temp 97.0°F | Ht 67.5 in | Wt 252.0 lb

## 2013-02-26 DIAGNOSIS — J069 Acute upper respiratory infection, unspecified: Secondary | ICD-10-CM

## 2013-02-26 DIAGNOSIS — H6692 Otitis media, unspecified, left ear: Secondary | ICD-10-CM

## 2013-02-26 DIAGNOSIS — H109 Unspecified conjunctivitis: Secondary | ICD-10-CM

## 2013-02-26 DIAGNOSIS — J029 Acute pharyngitis, unspecified: Secondary | ICD-10-CM

## 2013-02-26 DIAGNOSIS — H6690 Otitis media, unspecified, unspecified ear: Secondary | ICD-10-CM

## 2013-02-26 LAB — LIPID PANEL
Cholesterol: 178 mg/dL (ref 0–200)
HDL: 44 mg/dL (ref >39)
LDL Cholesterol: 91 mg/dL (ref 0–99)
Total CHOL/HDL Ratio: 4 Ratio
Triglycerides: 215 mg/dL — ABNORMAL HIGH (ref <150)
VLDL: 43 mg/dL — ABNORMAL HIGH (ref 0–40)

## 2013-02-26 LAB — POCT RAPID STREP A (OFFICE): Rapid Strep A Screen: NEGATIVE

## 2013-02-26 LAB — MAGNESIUM: Magnesium: 1.5 mg/dL (ref 1.5–2.5)

## 2013-02-26 LAB — LDL CHOLESTEROL, DIRECT: Direct LDL: 113 mg/dL — ABNORMAL HIGH

## 2013-02-26 MED ORDER — SULFAMETHOXAZOLE-TMP DS 800-160 MG PO TABS: 1.0000 | ORAL_TABLET | Freq: Two times a day (BID) | ORAL | Status: DC

## 2013-02-26 MED ORDER — CIPROFLOXACIN HCL 0.3 % OP SOLN: OPHTHALMIC | Status: DC

## 2013-02-26 MED ORDER — NEOMYCIN-POLYMYXIN-HC 3.5-10000-1 OT SOLN: 3.0000 [drp] | Freq: Three times a day (TID) | OTIC | Status: AC

## 2013-02-26 NOTE — Progress Notes (Signed)
Subjective:    Patient ID: [name], female    DOB: [birth year], 62 y.o.   MRN: [id number]  Sore Throat  This is a new problem. The current episode started in the past 7 days. The problem has been gradually worsening. Neither side of throat is experiencing more pain than the other. There has been no fever. The pain is at a severity of 8/10. Associated symptoms include congestion, coughing, ear pain and a plugged ear sensation. Pertinent negatives include no diarrhea, headaches, hoarse voice, neck pain or shortness of breath. She has tried gargles for the symptoms. The treatment provided no relief.  Otalgia  There is pain in the left ear. The current episode started in the past 7 days. The problem occurs constantly. The problem has been unchanged. There has been no fever. The pain is at a severity of 5/10. Associated symptoms include coughing and a sore throat. Pertinent negatives include no diarrhea, drainage, headaches or neck pain. She has tried NSAIDs for the symptoms. The treatment provided no relief.  Eye Problem  The right eye is affected.This is a new problem. The current episode started in the past 7 days. The problem occurs constantly. The problem has been gradually worsening. There was no injury mechanism. The pain is at a severity of 0/10. The patient is experiencing no pain. There is known exposure to pink eye. She does not wear contacts. Associated symptoms include an eye discharge and eye redness. Pertinent negatives include no blurred vision, double vision, fever, itching or weakness. She has tried nothing for the symptoms. The treatment provided no relief.   Presents today with compla    Review of Systems  Constitutional: Negative for fever and chills.  HENT: Positive for ear pain, congestion, sore throat, sneezing and postnasal drip. Negative for hoarse voice, neck pain and sinus pressure.   Eyes: Positive for discharge and redness. Negative for blurred vision and double  vision.  Respiratory: Positive for cough. Negative for chest tightness, shortness of breath and wheezing.   Cardiovascular: Negative for chest pain and palpitations.  Gastrointestinal: Negative for diarrhea.  Skin: Negative for itching.  Neurological: Negative for dizziness, weakness and headaches.       Objective:   Physical Exam  Constitutional: She is oriented to person, place, and time. She appears well-developed and well-nourished.  HENT:  Left Ear: Tympanic membrane is erythematous.  Nose: Mucosal edema present. Right sinus exhibits no maxillary sinus tenderness and no frontal sinus tenderness. Left sinus exhibits no maxillary sinus tenderness and no frontal sinus tenderness.  Mouth/Throat: Posterior oropharyngeal erythema present.  Eyes: EOM are normal. Pupils are equal, round, and reactive to light. Right eye exhibits exudate. Left eye exhibits exudate. Right conjunctiva is injected. Left conjunctiva is injected.  Cardiovascular: Normal rate, regular rhythm and normal heart sounds.   Pulmonary/Chest: Effort normal and breath sounds normal. No respiratory distress. She exhibits no tenderness.  Neurological: She is alert and oriented to person, place, and time.  Skin: Skin is warm and dry.  Psychiatric: She has a normal mood and affect.   Results for orders placed in visit on 02/26/13  POCT RAPID STREP A (OFFICE)      Result Value Range   Rapid Strep A Screen Negative  Negative         Assessment & Plan:  1. Sore throat - POCT rapid strep A  2. Conjunctivitis of both eyes - ciprofloxacin (CILOXAN) 0.3 % ophthalmic solution; Administer 1 drop, every 2 hours, while awake, for  2 days. Then 1 drop, every 4 hours, while awake, for the next 5 days.  Dispense: 10 mL; Refill: 1 -use separate wash cloths to clean each eye, wash bedding and pillow cases, dispose of make up/ brushes/pencils that have been used, do not share make up.   3. Left otitis media -  neomycin-polymyxin-hydrocortisone (CORTISPORIN) otic solution; Place 3 drops into the left ear 3 (three) times daily.  Dispense: 10 mL; Refill: 1  4. Acute upper respiratory infections of unspecified site - sulfamethoxazole-trimethoprim (BACTRIM DS) 800-160 MG per tablet; Take 1 tablet by mouth 2 (two) times daily.  Dispense: 14 tablet; Refill: 0 -increase fluid intake -rest -change toothbrush after being on antibiotic for three days -RTO if symptoms worsen and on Friday for follow up -Patient verbalized understanding -Erby Pian, FNP-C

## 2013-02-26 NOTE — Patient Instructions (Addendum)

## 2013-02-28 ENCOUNTER — Telehealth: Payer: Self-pay | Admitting: Pharmacist

## 2013-02-28 NOTE — Telephone Encounter (Signed)
Cholesterol has improved compared to results from 01/2013 - since patient has been compliant with Livalo and Tricor.  Continue same and recheck labs in 2- 3 months.  Magnesium was normal  - continue current supplementation of 250mg  magnesium BID

## 2013-03-01 ENCOUNTER — Encounter: Payer: Self-pay | Admitting: General Practice

## 2013-03-01 ENCOUNTER — Ambulatory Visit (INDEPENDENT_AMBULATORY_CARE_PROVIDER_SITE_OTHER): Payer: PRIVATE HEALTH INSURANCE | Admitting: General Practice

## 2013-03-01 VITALS — BP 103/49 | HR 82 | Temp 97.1°F | Wt 254.0 lb

## 2013-03-01 DIAGNOSIS — J069 Acute upper respiratory infection, unspecified: Secondary | ICD-10-CM

## 2013-03-01 DIAGNOSIS — T148XXA Other injury of unspecified body region, initial encounter: Secondary | ICD-10-CM

## 2013-03-01 DIAGNOSIS — J209 Acute bronchitis, unspecified: Secondary | ICD-10-CM

## 2013-03-01 MED ORDER — ALBUTEROL SULFATE HFA 108 (90 BASE) MCG/ACT IN AERS: 2.0000 | INHALATION_SPRAY | Freq: Four times a day (QID) | RESPIRATORY_TRACT | Status: DC | PRN

## 2013-03-01 NOTE — Progress Notes (Signed)
  Subjective:    Patient ID: [name], female    DOB: [birth year], 62 y.o.   MRN: [id number]  HPI Patient presents today for follow up of sore throat, cough, and stopped up ears. She reports sore throat has resolved, cough is better, but left ear still feels stopped up. She reports having a surgical procedure in January 2014 on left ear by ENT surgeon. She reports having one follow up visit with him. She reports the left ear hasn't felt the same since surgery. She reports cough is worse at night. She reports taking medications as prescribed. She reports having a blister on left great toe, from sandal rubbing against it. She reports using an antiseptic spray. She denies drainage, redness, or discoloration.     Review of Systems  Constitutional: Negative for fever and chills.  Respiratory: Positive for cough. Negative for chest tightness and shortness of breath.        Non-productive cough  Cardiovascular: Negative for chest pain and palpitations.  Skin:       Blister to left great toe  Neurological: Negative for dizziness, weakness and headaches.       Objective:   Physical Exam  Constitutional: She is oriented to person, place, and time. She appears well-developed and well-nourished.  HENT:  Head: Normocephalic and atraumatic.  Right Ear: External ear normal.  Left Ear: External ear normal.  Mouth/Throat: Oropharynx is clear and moist.  Cardiovascular: Normal rate, regular rhythm and normal heart sounds.   Pulmonary/Chest: Effort normal and breath sounds normal. No respiratory distress. She exhibits no tenderness.  Neurological: She is alert and oriented to person, place, and time.  Skin: Skin is warm and dry.  Left outer great toe has white colored fluid filled blister (size of nickel), with no erythema noted  Psychiatric: She has a normal mood and affect.          Assessment & Plan:  1. Blister -soak in warm salt water for 10-15 minutes three times daily -allow to  drain without applying pressure -refrain from wearing shoes that don't fit properly or causing discomfort -keep area clean and dry -monitor for signs of infection that were discussed  -RTO if symptoms worsen or unresolved  2. URI (upper respiratory infection) -Continue taking medications as directed -Will refer to ENT specialist if symptoms of left ear continue -RTO if symptoms worsen or unresolved or seek emergency medical treatment -Patient verbalized understanding -Erby Pian, FNP-C

## 2013-03-14 ENCOUNTER — Other Ambulatory Visit: Payer: Self-pay | Admitting: *Deleted

## 2013-03-14 MED ORDER — INSULIN GLARGINE 100 UNIT/ML ~~LOC~~ SOLN: SUBCUTANEOUS | Status: DC

## 2013-04-18 ENCOUNTER — Other Ambulatory Visit (INDEPENDENT_AMBULATORY_CARE_PROVIDER_SITE_OTHER): Payer: PRIVATE HEALTH INSURANCE

## 2013-04-18 DIAGNOSIS — Z1212 Encounter for screening for malignant neoplasm of rectum: Secondary | ICD-10-CM

## 2013-04-20 LAB — FECAL OCCULT BLOOD, IMMUNOCHEMICAL: Fecal Occult Blood: POSITIVE — AB

## 2013-04-23 ENCOUNTER — Other Ambulatory Visit (INDEPENDENT_AMBULATORY_CARE_PROVIDER_SITE_OTHER): Payer: PRIVATE HEALTH INSURANCE

## 2013-04-23 DIAGNOSIS — Z1212 Encounter for screening for malignant neoplasm of rectum: Secondary | ICD-10-CM

## 2013-04-23 NOTE — Progress Notes (Signed)
Patient dropped off fobt 

## 2013-04-25 LAB — SPECIMEN STATUS REPORT

## 2013-04-26 LAB — FECAL OCCULT BLOOD, IMMUNOCHEMICAL

## 2013-04-27 ENCOUNTER — Other Ambulatory Visit: Payer: Self-pay | Admitting: Family Medicine

## 2013-04-29 ENCOUNTER — Other Ambulatory Visit (INDEPENDENT_AMBULATORY_CARE_PROVIDER_SITE_OTHER): Payer: PRIVATE HEALTH INSURANCE

## 2013-04-29 DIAGNOSIS — R7989 Other specified abnormal findings of blood chemistry: Secondary | ICD-10-CM

## 2013-04-30 LAB — CBC WITH DIFFERENTIAL
Basophils Absolute: 0.1 10*3/uL (ref 0.0–0.2)
Basos: 1 %
Eos: 5 %
Eosinophils Absolute: 0.5 10*3/uL — ABNORMAL HIGH (ref 0.0–0.4)
HCT: 40.8 % (ref 34.0–46.6)
Hemoglobin: 13.3 g/dL (ref 11.1–15.9)
Immature Grans (Abs): 0 10*3/uL (ref 0.0–0.1)
Immature Granulocytes: 0 %
Lymphocytes Absolute: 3.4 10*3/uL — ABNORMAL HIGH (ref 0.7–3.1)
Lymphs: 32 %
MCH: 27.3 pg (ref 26.6–33.0)
MCHC: 32.6 g/dL (ref 31.5–35.7)
MCV: 84 fL (ref 79–97)
Monocytes Absolute: 0.8 10*3/uL (ref 0.1–0.9)
Monocytes: 7 %
Neutrophils Absolute: 5.9 10*3/uL (ref 1.4–7.0)
Neutrophils Relative %: 55 %
Platelets: 275 10*3/uL (ref 150–379)
RBC: 4.88 x10E6/uL (ref 3.77–5.28)
RDW: 14.5 % (ref 12.3–15.4)
WBC: 10.7 10*3/uL (ref 3.4–10.8)

## 2013-05-06 ENCOUNTER — Other Ambulatory Visit: Payer: Self-pay | Admitting: Family Medicine

## 2013-05-06 ENCOUNTER — Other Ambulatory Visit (INDEPENDENT_AMBULATORY_CARE_PROVIDER_SITE_OTHER): Payer: PRIVATE HEALTH INSURANCE

## 2013-05-06 DIAGNOSIS — Z1212 Encounter for screening for malignant neoplasm of rectum: Secondary | ICD-10-CM

## 2013-05-07 LAB — FECAL OCCULT BLOOD, IMMUNOCHEMICAL: Fecal Occult Bld: NEGATIVE

## 2013-05-09 ENCOUNTER — Ambulatory Visit (INDEPENDENT_AMBULATORY_CARE_PROVIDER_SITE_OTHER): Payer: Managed Care, Other (non HMO) | Admitting: Cardiology

## 2013-05-09 ENCOUNTER — Encounter: Payer: Self-pay | Admitting: Cardiology

## 2013-05-09 VITALS — BP 141/72 | HR 76 | Ht 67.5 in | Wt 249.0 lb

## 2013-05-09 DIAGNOSIS — I2510 Atherosclerotic heart disease of native coronary artery without angina pectoris: Secondary | ICD-10-CM

## 2013-05-09 DIAGNOSIS — R0989 Other specified symptoms and signs involving the circulatory and respiratory systems: Secondary | ICD-10-CM

## 2013-05-09 DIAGNOSIS — R9431 Abnormal electrocardiogram [ECG] [EKG]: Secondary | ICD-10-CM

## 2013-05-09 NOTE — Patient Instructions (Addendum)
The current medical regimen is effective;  continue present plan and medications.  You have been cleared for surgery.  Your physician has requested that you have a carotid duplex. This test is an ultrasound of the carotid arteries in your neck. It looks at blood flow through these arteries that supply the brain with blood. Allow one hour for this exam. There are no restrictions or special instructions.  Follow up in 6 months with Dr Percival Spanish.  You will receive a letter in the mail 2 months before you are due.  Please call us when you receive this letter to schedule your follow up appointment.

## 2013-05-09 NOTE — Progress Notes (Signed)
HPI The patient presents for preoperative evaluation prior to possible bariatric surgery. Since I last saw her she said no new cardiovascular complaints. She does her usual activities which include climbing stairs.  The patient denies any new symptoms such as chest discomfort, neck or arm discomfort. There has been no new shortness of breath, PND or orthopnea. There have been no reported palpitations, presyncope or syncope.  She does have some dyspnea with exertion but again this is baseline.   Allergies  Allergen Reactions  . Ezetimibe-Simvastatin Other (See Comments)    Leg cramps  . Penicillins Other (See Comments)    fainted  . Celebrex [Celecoxib] Rash  . Levemir [Insulin Detemir] Rash    Current Outpatient Prescriptions  Medication Sig Dispense Refill  . ACCU-CHEK AVIVA PLUS test strip CHECK BLOOD SUGAR UP TO TWICE DAILY OR AS PRESCRIBED  100 each  2  . albuterol (PROVENTIL HFA;VENTOLIN HFA) 108 (90 BASE) MCG/ACT inhaler Inhale 2 puffs into the lungs every 6 (six) hours as needed for wheezing.  1 Inhaler  3  . aspirin 325 MG tablet Take 325 mg by mouth daily.        . B-D INS SYR ULTRAFINE 1CC/31G 31G X 5/16" 1 ML MISC USE TWICE DAILY AS DIRECTED  100 each  1  . BD PEN NEEDLE NANO U/F 32G X 4 MM MISC USE TO INJECT NOVOLOG SUBCUTANEOUSLY 3 TIMES DAILY  100 each  2  . BENICAR HCT 40-25 MG per tablet Take 1 tablet by mouth daily.       . Canagliflozin (INVOKANA) 100 MG TABS Take 1 tablet (100 mg total) by mouth daily.  30 tablet  11  . Cholecalciferol (VITAMIN D) 2000 UNITS CAPS Take 1 capsule by mouth daily.      . fenofibrate (TRICOR) 48 MG tablet Take 1 tablet (48 mg total) by mouth daily.  30 tablet  2  . insulin aspart (NOVOLOG) 100 UNIT/ML injection Inject 50-58 Units into the skin 3 (three) times daily before meals. Based off of the cbg readings  1 vial  2  . insulin glargine (LANTUS) 100 UNIT/ML injection INJECT SUBCUTANEOUSLY 100 UNITS TWICE DAILY AS DIRECTED  10 mL  1  .  metFORMIN (GLUCOPHAGE) 1000 MG tablet Take 1 tablet (1,000 mg total) by mouth 2 (two) times daily with a meal.  60 tablet  11  . metoprolol (LOPRESSOR) 50 MG tablet Take 1.5 tablets (75 mg total) by mouth 2 (two) times daily.  90 tablet  11  . NEXIUM 40 MG capsule TAKE ONE CAPSULE BY MOUTH ONE TIME DAILY  30 capsule  5  . NOVOLOG FLEXPEN 100 UNIT/ML SOPN FlexPen INJECT UP TO 55 UNITS SUBCUTANEOUSLY 3 TIMES A DAY BEFORE MEALS  3 mL  2  . OVER THE COUNTER MEDICATION Take 1 tablet by mouth 2 (two) times daily. Mag/ calicum/vitaminD  Tablet 1 daily      . Pitavastatin Calcium 4 MG TABS Take 1 tablet (4 mg total) by mouth daily.  30 tablet  11  . [DISCONTINUED] Olmesartan-Amlodipine-HCTZ (TRIBENZOR) 40-5-12.5 MG TABS Take 1 tablet by mouth daily.         No current facility-administered medications for this visit.    Past Medical History  Diagnosis Date  . 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 Hyperlipidemia   . Cystocele   . IBS (irritable bowel syndrome)   . Varicose veins   . Superficial phlebitis   .  Esophageal stricture   . Fatty liver   . Osteopenia   . Vitamin D deficiency   . Effusion, pericardium   . Dyslipidemia   . GERD (gastroesophageal reflux disease)   . Osteoarthritis   . Pericardial effusion   . Sleep apnea     CPAP  . Hypertension     dr Percival Spanish  . Obesity   . CAD (coronary artery disease)     Nonobstructive by 2006 cath    Past Surgical History  Procedure Laterality Date  . Tonsillectomy  1968  . Cholecystectomy  1982  . Carpal tunnel release      Left  . Tubal ligation    . Cataract extraction    . Cardiac catheterization      06  . Parotidectomy  09/07/2012    Procedure: PAROTIDECTOMY;  Surgeon: Melida Quitter, MD;  Location: [facility] OR;  Service: ENT;  Laterality: Left;  LEFT PAROTIDECTOMY    ROS:  As stated in the HPI and negative for all other systems.  PHYSICAL EXAM BP 141/72  Pulse 76  Ht 5' 7.5" (1.715 m)  Wt 249 lb (112.946  kg)  BMI 38.4 kg/m2 GENERAL:  Well appearing NECK:  No jugular venous distention, waveform within normal limits, carotid upstroke brisk and symmetric, transmitted systolic murmur versus bruit right greater than left,  positive transmitted systolic murmur no thyromegaly LUNGS:  Clear to auscultation bilaterally BACK:  No CVA tenderness CHEST:  Unremarkable HEART:  PMI not displaced or sustained,S1 and S2 within normal limits, no S3, no S4, no clicks, no rubs, apical early peaking systolic murmur ABD:  Flat, positive bowel sounds normal in frequency in pitch, no bruits, no rebound, no guarding, no midline pulsatile mass, no hepatomegaly, no splenomegaly, obese EXT:  2 plus pulses throughout, positive mild edema, no cyanosis no clubbing, erythema and callus on left great toe    EKG:  Sinus rhythm, rate 77, axis within normal limits, intervals within normal limits, poor anterior R wave progression, lateral T-wave inversions. No change from previous. 05/09/2013   ASSESSMENT AND PLAN  PREOP - The patient has a functional level greater than 4 METS.  The procedure is moderate risk at most.  Therefore, based on ACC/AHA guidelines, the patient would be at acceptable risk for the planned procedure without further cardiovascular testing.  PERICARDIAL EFFUSION - I do not suspect this to be increased. No further imaging is indicated.  DYSPNEA -  Her dyspnea does seem to be at baseline. No change in therapy is indicated.  HYPERTENSION -  The blood pressure is at target. No change in medications is indicated. We will continue with therapeutic lifestyle changes (TLC).is

## 2013-05-11 ENCOUNTER — Other Ambulatory Visit: Payer: Self-pay | Admitting: Family Medicine

## 2013-05-13 ENCOUNTER — Encounter: Payer: Self-pay | Admitting: *Deleted

## 2013-05-15 ENCOUNTER — Other Ambulatory Visit: Payer: Self-pay | Admitting: Family Medicine

## 2013-05-20 ENCOUNTER — Ambulatory Visit (HOSPITAL_COMMUNITY): Payer: Managed Care, Other (non HMO) | Attending: Cardiology

## 2013-05-20 DIAGNOSIS — I10 Essential (primary) hypertension: Secondary | ICD-10-CM | POA: Insufficient documentation

## 2013-05-20 DIAGNOSIS — I658 Occlusion and stenosis of other precerebral arteries: Secondary | ICD-10-CM | POA: Insufficient documentation

## 2013-05-20 DIAGNOSIS — R0989 Other specified symptoms and signs involving the circulatory and respiratory systems: Secondary | ICD-10-CM

## 2013-05-20 DIAGNOSIS — I6529 Occlusion and stenosis of unspecified carotid artery: Secondary | ICD-10-CM

## 2013-05-20 DIAGNOSIS — I2510 Atherosclerotic heart disease of native coronary artery without angina pectoris: Secondary | ICD-10-CM | POA: Insufficient documentation

## 2013-05-20 DIAGNOSIS — E119 Type 2 diabetes mellitus without complications: Secondary | ICD-10-CM | POA: Insufficient documentation

## 2013-05-20 DIAGNOSIS — E785 Hyperlipidemia, unspecified: Secondary | ICD-10-CM | POA: Insufficient documentation

## 2013-05-22 ENCOUNTER — Ambulatory Visit (INDEPENDENT_AMBULATORY_CARE_PROVIDER_SITE_OTHER): Payer: PRIVATE HEALTH INSURANCE

## 2013-05-22 DIAGNOSIS — Z23 Encounter for immunization: Secondary | ICD-10-CM

## 2013-05-24 ENCOUNTER — Other Ambulatory Visit: Payer: Self-pay | Admitting: Family Medicine

## 2013-05-24 ENCOUNTER — Ambulatory Visit (INDEPENDENT_AMBULATORY_CARE_PROVIDER_SITE_OTHER): Payer: Self-pay | Admitting: Surgery

## 2013-05-27 ENCOUNTER — Other Ambulatory Visit: Payer: Self-pay | Admitting: Family Medicine

## 2013-05-27 ENCOUNTER — Other Ambulatory Visit: Payer: Self-pay | Admitting: General Practice

## 2013-05-27 DIAGNOSIS — E785 Hyperlipidemia, unspecified: Secondary | ICD-10-CM

## 2013-05-27 MED ORDER — FENOFIBRATE 48 MG PO TABS: 48.0000 mg | ORAL_TABLET | Freq: Every day | ORAL | Status: DC

## 2013-05-27 NOTE — Telephone Encounter (Signed)
Last seen 03/01/13  [name]  Last glucose 01/24/13

## 2013-05-28 ENCOUNTER — Telehealth: Payer: Self-pay | Admitting: *Deleted

## 2013-05-28 MED ORDER — INSULIN PEN NEEDLE 32G X 4 MM MISC: 1.0000 | Freq: Three times a day (TID) | Status: DC

## 2013-05-28 NOTE — Telephone Encounter (Signed)
Aware, rx for Tricor 48mg  ,#30 , 3 refills at front.

## 2013-06-05 ENCOUNTER — Encounter: Payer: Self-pay | Admitting: Family Medicine

## 2013-06-05 ENCOUNTER — Ambulatory Visit (INDEPENDENT_AMBULATORY_CARE_PROVIDER_SITE_OTHER): Payer: PRIVATE HEALTH INSURANCE | Admitting: Family Medicine

## 2013-06-05 VITALS — BP 133/67 | HR 73 | Temp 97.7°F | Ht 67.5 in | Wt 250.0 lb

## 2013-06-05 DIAGNOSIS — E119 Type 2 diabetes mellitus without complications: Secondary | ICD-10-CM

## 2013-06-05 DIAGNOSIS — L678 Other hair color and hair shaft abnormalities: Secondary | ICD-10-CM

## 2013-06-05 DIAGNOSIS — K219 Gastro-esophageal reflux disease without esophagitis: Secondary | ICD-10-CM

## 2013-06-05 DIAGNOSIS — I10 Essential (primary) hypertension: Secondary | ICD-10-CM

## 2013-06-05 DIAGNOSIS — IMO0001 Reserved for inherently not codable concepts without codable children: Secondary | ICD-10-CM

## 2013-06-05 DIAGNOSIS — L738 Other specified follicular disorders: Secondary | ICD-10-CM

## 2013-06-05 DIAGNOSIS — L739 Follicular disorder, unspecified: Secondary | ICD-10-CM

## 2013-06-05 DIAGNOSIS — I2510 Atherosclerotic heart disease of native coronary artery without angina pectoris: Secondary | ICD-10-CM

## 2013-06-05 DIAGNOSIS — E785 Hyperlipidemia, unspecified: Secondary | ICD-10-CM

## 2013-06-05 DIAGNOSIS — L853 Xerosis cutis: Secondary | ICD-10-CM

## 2013-06-05 LAB — POCT CBC
Granulocyte percent: 62.2 %G (ref 37–80)
HCT, POC: 45.3 % (ref 37.7–47.9)
Hemoglobin: 14.7 g/dL (ref 12.2–16.2)
Lymph, poc: 4.5 — AB (ref 0.6–3.4)
MCH, POC: 26.6 pg — AB (ref 27–31.2)
MCHC: 32.5 g/dL (ref 31.8–35.4)
MCV: 81.8 fL (ref 80–97)
MPV: 8.3 fL (ref 0–99.8)
POC Granulocyte: 7.7 — AB (ref 2–6.9)
POC LYMPH PERCENT: 36.1 %L (ref 10–50)
Platelet Count, POC: 255 10*3/uL (ref 142–424)
RBC: 5.5 M/uL — AB (ref 4.04–5.48)
RDW, POC: 15 %
WBC: 12.4 10*3/uL — AB (ref 4.6–10.2)

## 2013-06-05 LAB — POCT UA - MICROALBUMIN: Microalbumin Ur, POC: POSITIVE mg/L

## 2013-06-05 NOTE — Progress Notes (Signed)
Subjective:    Patient ID: [name], female    DOB: Jul 24, 1951, 62 y.o.   MRN: [id number]  HPI Pt here for follow up and management of chronic medical problems. Patient comes in today and discuss today the possibility of doing a gastric sleeve for her obesity. She has information from Dr. Alphonsa Overall and would like to proceed with this surgery. She brought forms for review some of these forms have been completed by some of our nursing staff already. After reviewing these forms she will be given an appointment to see the clinical pharmacist for more strict dietary management. The patient has had multiple visits with the clinical pharmacist to help her in managing her weight and overall has not been very successful with these visits. I do highly recommend that something more aggressive be done i.e. gastric surgery to help her with her morbid obese problems. A letter was composed today to be sent to the surgeon indicating that this would be to her benefit. This letter was typed while visit was being done and will be scanned into her chart. She was given the original letter to take with her to the surgeon. Also as part of the protocol she will be scheduled to come back and see the clinical pharmacist and begin again another strict dietary plan for losing more weight. She also complains today of some problems with her to and that she is seeing the podiatrist and has been on a couple of rounds of antibiotics for this. She also complains of some areas of skin irritation on her left posterior calf . The patient has also seen a cardiologist and he has given her an okay for the surgery. She has seen the pulmonologist and he says that her breathing issues are more due to her weight and not a diagnosis of COPD.    Patient Active Problem List   Diagnosis Date Noted  . Obesity 07/27/2011  . DYSPNEA 08/18/2010  . MYOCARDIAL PERFUSION SCAN, WITH STRESS TEST, ABNORMAL 08/04/2010  . ELECTROCARDIOGRAM, ABNORMAL  07/28/2010  . Edema extremities 12/30/2008  . DM 12/26/2008  . DYSLIPIDEMIA 12/26/2008  . HYPERTENSION 12/26/2008  . CAD 12/26/2008  . PERICARDIAL EFFUSION 12/26/2008  . GERD 12/26/2008  . OSTEOARTHRITIS 12/26/2008   Outpatient Encounter Prescriptions as of 06/05/2013  Medication Sig Dispense Refill  . ACCU-CHEK AVIVA PLUS test strip CHECK BLOOD SUGAR UP TO TWICE DAILY OR AS PRESCRIBED  100 each  2  . aspirin 325 MG tablet Take 325 mg by mouth daily.        . B-D INS SYR ULTRAFINE 1CC/31G 31G X 5/16" 1 ML MISC USE TWICE DAILY AS DIRECTED  100 each  1  . BD PEN NEEDLE NANO U/F 32G X 4 MM MISC USE TO INJECT NOVOLOG SUBCUTANEOUSLY 3 TIMES DAILY  100 each  2  . BENICAR HCT 40-25 MG per tablet TAKE ONE TABLET BY MOUTH ONE TIME DAILY  30 tablet  5  . Canagliflozin (INVOKANA) 100 MG TABS Take 1 tablet (100 mg total) by mouth daily.  30 tablet  11  . Cholecalciferol (VITAMIN D) 2000 UNITS CAPS Take 1 capsule by mouth daily.      . fenofibrate (TRICOR) 48 MG tablet TAKE ONE TABLET BY MOUTH ONE TIME DAILY  30 tablet  2  . insulin aspart (NOVOLOG) 100 UNIT/ML injection Inject 50-58 Units into the skin 3 (three) times daily before meals. Based off of the cbg readings  1 vial  2  . Insulin  Pen Needle (BD PEN NEEDLE NANO U/F) 32G X 4 MM MISC Inject 1 Stick into the skin 3 (three) times daily.  100 each  0  . LANTUS 100 UNIT/ML injection INJECT 100 UNITS UNDER THE SKIN TWICE A DAY AS INSTRUCTED  60 pen  1  . metFORMIN (GLUCOPHAGE) 1000 MG tablet Take 1 tablet (1,000 mg total) by mouth 2 (two) times daily with a meal.  60 tablet  11  . metoprolol (LOPRESSOR) 50 MG tablet Take 1.5 tablets (75 mg total) by mouth 2 (two) times daily.  90 tablet  11  . NEXIUM 40 MG capsule TAKE ONE CAPSULE BY MOUTH ONE TIME DAILY  30 capsule  5  . NOVOLOG FLEXPEN 100 UNIT/ML SOPN FlexPen INJECT UP TO 55 UNITS SUBCUTANEOUSLY 3 TIMES A DAY BEFORE MEALS  3 mL  2  . OVER THE COUNTER MEDICATION Take 1 tablet by mouth 2 (two) times  daily. Mag/ calicum/vitaminD  Tablet 1 daily      . Pitavastatin Calcium 4 MG TABS Take 1 tablet (4 mg total) by mouth daily.  30 tablet  11  . albuterol (PROVENTIL HFA;VENTOLIN HFA) 108 (90 BASE) MCG/ACT inhaler Inhale 2 puffs into the lungs every 6 (six) hours as needed for wheezing.  1 Inhaler  3  . [DISCONTINUED] fenofibrate (TRICOR) 48 MG tablet Take 1 tablet (48 mg total) by mouth daily.  30 tablet  3   No facility-administered encounter medications on file as of 06/05/2013.    Review of Systems  Constitutional: Negative.        Scheduled to see Dr. Alphonsa Overall 06-26-13 for bariatric surgery consult.  HENT: Negative.   Eyes: Negative.   Respiratory: Negative.   Cardiovascular: Negative.   Gastrointestinal: Negative.   Endocrine: Negative.   Genitourinary: Negative.   Musculoskeletal: Positive for arthralgias (right heels pain).  Skin: Positive for color change (right leg skin color change).       Toe fungus to left foot  Allergic/Immunologic: Negative.   Neurological: Negative.   Hematological: Negative.   Psychiatric/Behavioral: Negative.        Objective:   Physical Exam  Nursing note and vitals reviewed. Constitutional: She is oriented to person, place, and time. She appears well-developed and well-nourished. No distress.  HENT:  Head: Normocephalic.  Right Ear: External ear normal.  Left Ear: External ear normal.  Nose: Nose normal.  Mouth/Throat: Oropharynx is clear and moist. No oropharyngeal exudate.  Eyes: Conjunctivae and EOM are normal. Right eye exhibits no discharge. Left eye exhibits no discharge. No scleral icterus.  Nasal congestion bilateral  Neck: Normal range of motion. Neck supple. No thyromegaly present.  Cardiovascular: Normal rate, regular rhythm, normal heart sounds and intact distal pulses.  Exam reveals no gallop and no friction rub.   No murmur heard. At 72 per minute  Pulmonary/Chest: Effort normal and breath sounds normal. She has no  wheezes. She has no rales. She exhibits no tenderness.  Abdominal: Soft. Bowel sounds are normal. She exhibits no distension and no mass. There is no tenderness. There is no rebound and no guarding.  Morbidly obese abdomen  Musculoskeletal: Normal range of motion. She exhibits no edema and no tenderness.  Lymphadenopathy:    She has no cervical adenopathy.  Neurological: She is alert and oriented to person, place, and time. She has normal reflexes. No cranial nerve deficit.  Skin: Skin is warm and dry.  Skin is very dry with areas of folliculitis and induration especially on the left  posterior calf  Psychiatric: She has a normal mood and affect. Her behavior is normal. Judgment and thought content normal.   BP 133/67  Pulse 73  Temp(Src) 97.7 F (36.5 C) (Oral)  Ht 5' 7.5" (1.715 m)  Wt 250 lb (113.399 kg)  BMI 38.56 kg/m2   A. diabetic foot exam was done     Assessment & Plan:   1. CAD   2. DM   3. DYSLIPIDEMIA   4. HYPERTENSION   5. GERD   6. Folliculitis   7. Obesity, Class II, BMI 35-39.9, with comorbidity   8. Dry skin    Orders Placed This Encounter  Procedures  . Lipid panel  . Hepatic function panel  . BMP8+EGFR  . Vit D  25 hydroxy (rtn osteoporosis monitoring)  . Magnesium  . Microalbumin, urine  . POCT CBC  . POCT UA - Microalbumin   No orders of the defined types were placed in this encounter.   Patient Instructions  Continue current medications. Continue good therapeutic lifestyle changes.  Fall precautions discussed with patient. Follow up as planned and earlier as needed.  We will give you a note today to take to the surgeon recommending that you have gastric sleeve surgery for your morbid obesity We will schedule a visit for you with a clinical pharmacist to get on aggressive diet to help with your losing more weight Continue to monitor your blood sugars regularly    Arrie Senate MD

## 2013-06-05 NOTE — Patient Instructions (Addendum)
Continue current medications. Continue good therapeutic lifestyle changes.  Fall precautions discussed with patient. Follow up as planned and earlier as needed.  We will give you a note today to take to the surgeon recommending that you have gastric sleeve surgery for your morbid obesity We will schedule a visit for you with a clinical pharmacist to get on aggressive diet to help with your losing more weight Continue to monitor your blood sugars regularly

## 2013-06-06 LAB — BMP8+EGFR
BUN/Creatinine Ratio: 19 (ref 11–26)
BUN: 34 mg/dL — ABNORMAL HIGH (ref 8–27)
CO2: 25 mmol/L (ref 18–29)
Calcium: 10 mg/dL (ref 8.6–10.2)
Chloride: 102 mmol/L (ref 97–108)
Creatinine, Ser: 1.83 mg/dL — ABNORMAL HIGH (ref 0.57–1.00)
GFR calc Af Amer: 34 mL/min/{1.73_m2} — ABNORMAL LOW (ref >59)
GFR calc non Af Amer: 29 mL/min/{1.73_m2} — ABNORMAL LOW (ref >59)
Glucose: 59 mg/dL — ABNORMAL LOW (ref 65–99)
Potassium: 4.3 mmol/L (ref 3.5–5.2)
Sodium: 148 mmol/L — ABNORMAL HIGH (ref 134–144)

## 2013-06-06 LAB — LIPID PANEL
Chol/HDL Ratio: 4.8 ratio units — ABNORMAL HIGH (ref 0.0–4.4)
Cholesterol, Total: 214 mg/dL — ABNORMAL HIGH (ref 100–199)
HDL: 45 mg/dL (ref >39)
Triglycerides: 435 mg/dL — ABNORMAL HIGH (ref 0–149)

## 2013-06-06 LAB — HEPATIC FUNCTION PANEL
ALT: 23 IU/L (ref 0–32)
AST: 19 IU/L (ref 0–40)
Albumin: 4.3 g/dL (ref 3.6–4.8)
Alkaline Phosphatase: 60 IU/L (ref 39–117)
Bilirubin, Direct: 0.11 mg/dL (ref 0.00–0.40)
Total Bilirubin: 0.3 mg/dL (ref 0.0–1.2)
Total Protein: 6.3 g/dL (ref 6.0–8.5)

## 2013-06-06 LAB — MICROALBUMIN, URINE: Microalbumin, Urine: 33.6 ug/mL — ABNORMAL HIGH (ref 0.0–17.0)

## 2013-06-06 LAB — MAGNESIUM: Magnesium: 1.1 mg/dL — ABNORMAL LOW (ref 1.6–2.6)

## 2013-06-06 LAB — VITAMIN D 25 HYDROXY (VIT D DEFICIENCY, FRACTURES): Vit D, 25-Hydroxy: 12.7 ng/mL — ABNORMAL LOW (ref 30.0–100.0)

## 2013-06-07 ENCOUNTER — Other Ambulatory Visit: Payer: Self-pay | Admitting: Family Medicine

## 2013-06-10 ENCOUNTER — Telehealth: Payer: Self-pay | Admitting: Pharmacist

## 2013-06-10 ENCOUNTER — Ambulatory Visit: Payer: PRIVATE HEALTH INSURANCE

## 2013-06-10 NOTE — Telephone Encounter (Signed)
No appts available until 06/19/13

## 2013-06-12 ENCOUNTER — Encounter: Payer: Self-pay | Admitting: *Deleted

## 2013-06-19 ENCOUNTER — Other Ambulatory Visit: Payer: Self-pay | Admitting: Obstetrics and Gynecology

## 2013-06-19 ENCOUNTER — Ambulatory Visit: Payer: PRIVATE HEALTH INSURANCE

## 2013-06-26 ENCOUNTER — Encounter (INDEPENDENT_AMBULATORY_CARE_PROVIDER_SITE_OTHER): Payer: Self-pay

## 2013-06-26 ENCOUNTER — Encounter (INDEPENDENT_AMBULATORY_CARE_PROVIDER_SITE_OTHER): Payer: Self-pay | Admitting: Surgery

## 2013-06-26 ENCOUNTER — Other Ambulatory Visit (INDEPENDENT_AMBULATORY_CARE_PROVIDER_SITE_OTHER): Payer: Self-pay

## 2013-06-26 ENCOUNTER — Ambulatory Visit (INDEPENDENT_AMBULATORY_CARE_PROVIDER_SITE_OTHER): Payer: PRIVATE HEALTH INSURANCE | Admitting: Surgery

## 2013-06-26 DIAGNOSIS — K2100 Gastro-esophageal reflux disease with esophagitis, without bleeding: Secondary | ICD-10-CM

## 2013-06-26 DIAGNOSIS — I2510 Atherosclerotic heart disease of native coronary artery without angina pectoris: Secondary | ICD-10-CM

## 2013-06-26 DIAGNOSIS — E119 Type 2 diabetes mellitus without complications: Secondary | ICD-10-CM

## 2013-06-26 DIAGNOSIS — R5383 Other fatigue: Secondary | ICD-10-CM

## 2013-06-26 DIAGNOSIS — Z6839 Body mass index (BMI) 39.0-39.9, adult: Secondary | ICD-10-CM

## 2013-06-26 DIAGNOSIS — E785 Hyperlipidemia, unspecified: Secondary | ICD-10-CM

## 2013-06-26 DIAGNOSIS — G4733 Obstructive sleep apnea (adult) (pediatric): Secondary | ICD-10-CM

## 2013-06-26 NOTE — Progress Notes (Addendum)
Re:   [name] DOB:   02-25-1951 MRN:   [id number]  ASSESSMENT AND PLAN: 1.  Morbid obesity  Initial weight - 249, BMI - 39.2  Per the [facility], the patient is a candidate for bariatric surgery.  The patient attended our initial information session and reviewed the types of bariatric surgery.    The patient is interested in the gastric sleeve.  I discussed with the patient the indications and risks of bariatric surgery.  The potential risks of surgery include, but are not limited to, bleeding, infection, leak from the bowel, DVT and PE, open surgery, long term nutrition consequences, and death.  The patient understands the importance of compliance and long term follow-up with our group after surgery.  From here we will obtain lab tests, x-rays, nutrition consult, and psych consult.  I also discussed that I have not been the primary surgeon on a sleeve gastrectomy.  Dr. Lilyan Punt, who has the greatest experience, is leaving.  2.  Insulin dependent DM 3.  Hyperlipidemia 4.  OSA  On CPAP since Dec 2013. 5.  CAD 6.  GERD 7.  Left parotid tumor - pleomorphic adenoma  Excised 09/07/2012 - Dr. Guido Sander [Saw Dr. Darene Lamer. 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  DN  09/17/2013]  Chief Complaint  Patient presents with  . Bariatric Pre-op   REFERRING PHYSICIAN: Redge Gainer, MD  HISTORY OF PRESENT ILLNESS: [name] is a 62 y.o. (DOB: 17-Jun-1951)  white  female whose primary care physician is Redge Gainer, MD and comes to me today for bariatric sugery.  The patient is interested in a sleeve gastrectomy. She heard Dr. Lilyan Punt at our information session. She has been overweight since her teenage years. She says when she got married she weighed 149 pounds. After her three children, she has steadily gained weight. She has tried Weight Watchers, Atkins, and low calorie diets. Her best success with weight loss and a diet was with "don't between meals" diet.  She tried a weight loss medicine through Dr. Cloyd Stagers - but it made her tachycardic and she stopped it.  She knows some friends who have had weight loss surgery, but she is unsure of what they have done.   I have encouraged her to go to the support group to see patients with different operations.  I discussed that her GERD may limit a sleeve resection.  We will see what the UGI shows.  Though her weight is in the low range for bariatric surgery - all her weight is in her abdomen.  Any weight that she loses will be to her benefit.  She does admit to being a "nibbler" - but I told her that this could defeat any of the bariatric surgical options.  I also discussed that I have not been the primary surgeon on a sleeve gastrectomy, though I have assisted in more than 10 cases.  Our surgeon who has the most experience with gastric sleeves, Dr. Lilyan Punt, is leaving in December.  I offered that she could see one of my other partners.   Past Medical History  Diagnosis Date  . 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 Hyperlipidemia   . Cystocele   . IBS (irritable bowel syndrome)   . Varicose veins   . Superficial phlebitis   . Esophageal stricture   . Fatty liver   . Osteopenia   . Vitamin D deficiency   .  Effusion, pericardium   . Dyslipidemia   . GERD (gastroesophageal reflux disease)   . Osteoarthritis   . Pericardial effusion   . Sleep apnea     CPAP  . Hypertension     dr Percival Spanish  . Obesity   . CAD (coronary artery disease)     Nonobstructive by 2006 cath      Past Surgical History  Procedure Laterality Date  . Tonsillectomy  1968  . Cholecystectomy  1982  . Carpal tunnel release      Left  . Tubal ligation    . Cataract extraction    . Cardiac catheterization      06  . Parotidectomy  09/07/2012    Procedure: PAROTIDECTOMY;  Surgeon: Melida Quitter, MD;  Location: [facility] OR;  Service: ENT;  Laterality: Left;  LEFT PAROTIDECTOMY      Current Outpatient  Prescriptions  Medication Sig Dispense Refill  . ACCU-CHEK AVIVA PLUS test strip CHECK BLOOD SUGAR UP TO TWICE DAILY OR AS PRESCRIBED  100 each  2  . albuterol (PROVENTIL HFA;VENTOLIN HFA) 108 (90 BASE) MCG/ACT inhaler Inhale 2 puffs into the lungs every 6 (six) hours as needed for wheezing.  1 Inhaler  3  . aspirin 325 MG tablet Take 325 mg by mouth daily.        . B-D INS SYR ULTRAFINE 1CC/31G 31G X 5/16" 1 ML MISC USE TWICE DAILY AS DIRECTED  100 each  1  . BD PEN NEEDLE NANO U/F 32G X 4 MM MISC USE TO INJECT NOVOLOG SUBCUTANEOUSLY 3 TIMES DAILY  100 each  2  . BENICAR HCT 40-25 MG per tablet TAKE ONE TABLET BY MOUTH ONE TIME DAILY  30 tablet  5  . Canagliflozin (INVOKANA) 100 MG TABS Take 1 tablet (100 mg total) by mouth daily.  30 tablet  11  . Cholecalciferol (VITAMIN D) 2000 UNITS CAPS Take 1 capsule by mouth daily.      . fenofibrate (TRICOR) 48 MG tablet TAKE ONE TABLET BY MOUTH ONE TIME DAILY  30 tablet  2  . insulin aspart (NOVOLOG) 100 UNIT/ML injection Inject 50-58 Units into the skin 3 (three) times daily before meals. Based off of the cbg readings  1 vial  2  . Insulin Pen Needle (BD PEN NEEDLE NANO U/F) 32G X 4 MM MISC Inject 1 Stick into the skin 3 (three) times daily.  100 each  0  . LANTUS 100 UNIT/ML injection INJECT 100 UNITS UNDER THE SKIN TWICE A DAY AS INSTRUCTED  60 pen  1  . metFORMIN (GLUCOPHAGE) 1000 MG tablet Take 1 tablet (1,000 mg total) by mouth 2 (two) times daily with a meal.  60 tablet  11  . metoprolol (LOPRESSOR) 50 MG tablet Take 1.5 tablets (75 mg total) by mouth 2 (two) times daily.  90 tablet  11  . NEXIUM 40 MG capsule TAKE ONE CAPSULE BY MOUTH ONE TIME DAILY  30 capsule  5  . NOVOLOG FLEXPEN 100 UNIT/ML SOPN FlexPen INJECT UP TO 55 UNITS UNDER THE SKIN 3 TIMES A DAY BEFORE MEALS ASINSTRUCTED  60 mL  2  . OVER THE COUNTER MEDICATION Take 1 tablet by mouth 2 (two) times daily. Mag/ calicum/vitaminD  Tablet 1 daily      . Pitavastatin Calcium 4 MG TABS Take  1 tablet (4 mg total) by mouth daily.  30 tablet  11  . [DISCONTINUED] Olmesartan-Amlodipine-HCTZ (TRIBENZOR) 40-5-12.5 MG TABS Take 1 tablet by mouth daily.  No current facility-administered medications for this visit.      Allergies  Allergen Reactions  . Ezetimibe-Simvastatin Other (See Comments)    Leg cramps  . Penicillins Other (See Comments)    fainted  . Celebrex [Celecoxib] Rash  . Levemir [Insulin Detemir] Rash    REVIEW OF SYSTEMS: Skin:  No history of rash.  No history of abnormal moles. Infection:  No history of hepatitis or HIV.  No history of MRSA. Neurologic:  No history of stroke.  No history of seizure.  No history of headaches. Cardiac:  She sees Dr. Percival Spanish from a cardiology standpoint.  She had a heart cath about 2010.  No history of seeing a cardiologist. Pulmonary:  OSA.   On CPAP since Dec 2013.  She did see Dr. Melvyn Novas, but he has released her.  Left parotid tumor excised by Dr. Redmond Baseman - Jan 2014.  Endocrine:  IDDM since 2008.  She first started her DM in 1988. No thyroid disease.  She says that her Magnesium and Vit D are low. Gastrointestinal:  Has GERD on Nexium.  But no prior upper endo.  No history of liver disease.  Open cholecystectomy - 1982.  .  No history of pancreas disease.  No history of colon disease.  Had neg colonoscopy by Dr. Fuller Plan - 2009. Urologic:  No history of kidney stones.  No history of bladder infections. GYN:  Sees Dr. Harrington Challenger.  They have talked about a hysterectomy, but not now. Musculoskeletal:  Chronic degenerative back disease.  She saw Dr. Linden Dolin, but not interested in seeing him any more.  She has a "hole" in her left big toe. Hematologic:  No bleeding disorder.  No history of anemia.  Not anticoagulated. Psycho-social:  The patient is oriented.   The patient has no obvious psychologic or social impairment to understanding our conversation and plan.  SOCIAL and FAMILY HISTORY: Married. On disability for back issues and DM  since 2012. She has 3 daughters - 36, 84, and 53.  Her 38 yo daughter is a Marine scientist at [employer].  Her other two daughters are respiratory therapists at [employer]. She has 3 grandchildren whom she wants to play with more.  PHYSICAL EXAM: BP 130/64  Pulse 74  Temp(Src) 98 F (36.7 C) (Temporal)  Resp 16  Ht 5' 7.75" (1.721 m)  Wt 249 lb 12.8 oz (113.309 kg)  BMI 38.26 kg/m2  General: Obese WF who is alert and generally healthy appearing.  HEENT: Normal. Pupils equal. Neck: Supple. No mass.  No thyroid mass. Lymph Nodes:  No supraclavicular or cervical nodes. Lungs: Clear to auscultation and symmetric breath sounds. Heart:  RRR. No murmur or rub. Abdomen: Soft. No mass. No tenderness. No hernia. Normal bowel sounds.  All her weight is in her abdomen.  She is all apple.  She has a long right subcostal scar from the cholecystectomy.  Rectal: Not done. Extremities:  Good strength and ROM  in upper and lower extremities. Neurologic:  Grossly intact to motor and sensory function. Psychiatric: Has normal mood and affect. Behavior is normal.   DATA REVIEWED: Epic and notes that she brought with her.  Alphonsa Overall, MD,  [facility], [address]  [place], [place]    [place] Phone:  [phone] FAX:  [phone]

## 2013-07-03 ENCOUNTER — Telehealth: Payer: Self-pay | Admitting: Cardiology

## 2013-07-03 LAB — COMPREHENSIVE METABOLIC PANEL
ALT: 24 U/L (ref 0–35)
AST: 14 U/L (ref 0–37)
Albumin: 3.6 g/dL (ref 3.5–5.2)
Alkaline Phosphatase: 52 U/L (ref 39–117)
BUN: 28 mg/dL — ABNORMAL HIGH (ref 6–23)
CO2: 29 mEq/L (ref 19–32)
Calcium: 9.1 mg/dL (ref 8.4–10.5)
Chloride: 103 mEq/L (ref 96–112)
Creat: 1.17 mg/dL — ABNORMAL HIGH (ref 0.50–1.10)
Glucose, Bld: 109 mg/dL — ABNORMAL HIGH (ref 70–99)
Potassium: 4.4 mEq/L (ref 3.5–5.3)
Sodium: 142 mEq/L (ref 135–145)
Total Bilirubin: 0.3 mg/dL (ref 0.3–1.2)
Total Protein: 6.2 g/dL (ref 6.0–8.3)

## 2013-07-03 LAB — CBC
HCT: 41.8 % (ref 36.0–46.0)
Hemoglobin: 13.9 g/dL (ref 12.0–15.0)
MCH: 27.7 pg (ref 26.0–34.0)
MCHC: 33.3 g/dL (ref 30.0–36.0)
MCV: 83.4 fL (ref 78.0–100.0)
Platelets: 270 10*3/uL (ref 150–400)
RBC: 5.01 MIL/uL (ref 3.87–5.11)
RDW: 15.3 % (ref 11.5–15.5)
WBC: 9.1 10*3/uL (ref 4.0–10.5)

## 2013-07-03 LAB — HEMOGLOBIN A1C
Hgb A1c MFr Bld: 9.2 % — ABNORMAL HIGH (ref <5.7)
Mean Plasma Glucose: 217 mg/dL — ABNORMAL HIGH (ref <117)

## 2013-07-03 LAB — TSH: TSH: 1.199 u[IU]/mL (ref 0.350–4.500)

## 2013-07-03 NOTE — Telephone Encounter (Signed)
Did not receive faxed clearance request but was able to print the letter from [facility] which was given to Dr Percival Spanish for review and recommendations.

## 2013-07-03 NOTE — Telephone Encounter (Signed)
New problem   [place] calling from [facility]    Fax request  Sent on  11/12 . Office waiting on clearance.

## 2013-07-04 ENCOUNTER — Ambulatory Visit (INDEPENDENT_AMBULATORY_CARE_PROVIDER_SITE_OTHER): Payer: PRIVATE HEALTH INSURANCE | Admitting: Family Medicine

## 2013-07-04 DIAGNOSIS — E119 Type 2 diabetes mellitus without complications: Secondary | ICD-10-CM

## 2013-07-04 DIAGNOSIS — R635 Abnormal weight gain: Secondary | ICD-10-CM

## 2013-07-04 NOTE — Progress Notes (Signed)
Subjective:     [name] is a 62 y.o. female here for discussion regarding weight loss. She has recently met with Dr Lucia Gaskins regarding Sleeve gastrectomy for obesity.  She is has been cleared medically for surgery and if currently going through process in meeting insurance requirements prior to gastric surgery.  She is trying to get this completed before her insurance changes in March 2015.  She attended an information session by [facility] and is waiting appointment with nutritionist.   She has noted a weight gain of approximately 100 pounds over the last 10 years. She feels ideal weight is 180 pounds. Weight at graduation from high school was 160 pounds. History of eating disorders: none. There is a family history positive for obesity in the patient and sister. Previous treatments for obesity include self-directed dieting and supervised diet program. Obesity associated medical conditions: depression, diabetes mellitus, hyperlipidemia and hypertension. Obesity associated medications: none. Cardiovascular risk factors besides obesity: diabetes mellitus, dyslipidemia, hypertension, obesity (BMI >= 30 kg/m2) and sedentary lifestyle.  24 hours dietary recall - patient reports that she is trying to avoid snacks although yesterday she had a brownie for a snack.  For breakfast she had vegetable soup, lunch she cannot recall, supper was [employer] - fried chicken, mashed potatoes and gravy, mac and cheese and biscuit.  She only drinks unsweetened tea and diet Sun Drop.  She does report eating out frequently and eating meals on front of the TV.  She is not currently exercising due to back pain and current foot infection (she has follow up with podiatrist today)  Patient reports that BG is better controlled when she eats as recommended.  Her last A1c was 9.2% (07/03/2013).  Previousl A1c was 10.1%. I was also concerned that her serum creatinine was 1.83 four weeks ago but it was down to 1.83 yesterday  (07/03/2013).    The following portions of the patient's history were reviewed and updated as appropriate: allergies, current medications, past family history, past medical history, past social history, past surgical history and problem list.  Review of Systems Pertinent items are noted in HPI.    Objective:    Body mass index is 39.77 kg/(m^2).  Filed Vitals:   07/04/13 1010  BP: 132/72  Pulse: 72      Assessment:    Obesity. I assessed [name] to be in an action stage with respect to weight loss.  Diabetes - improving but still suboptimal.  Elevated Serum creatinine - Has decreased back to normal but need to follow closely.  If increases to greater than 1.4 again will discontinue metformin and possibly invokana.   Plan:    General weight loss/lifestyle modification strategies discussed (elicit support from others; identify saboteurs; non-food rewards, etc). Behavioral treatment: Discussed that she must pay more attention to her mood and stress level when eating.  Should focus on meal and enjoying the taste of food (ie not eating while watching TV). Diet interventions: moderate (500 kCal/d) deficit diet. Informal exercise measures discussed, e.g. taking stairs instead of elevator.  Recheck BMET in 4 weeks.  If BMET normal at next check might consider increasing Invokana to 300mg  daily.  Discussed specific considerations when eating out - smaller portion, choosing lower calorie options (salad, low fat dressing, lean, non fried meats, broth based soups, limiting CHOs)  Time spent with patient = 40 minutes Cherre Robins, PharmD, CPP  I have seen the patient and agree with the above management plan for her weight loss and  her scheduled surgery.  Arrie Senate MD

## 2013-07-05 NOTE — Telephone Encounter (Signed)
Paperwork completed and given to MR to be faxed to [facility]

## 2013-07-08 ENCOUNTER — Telehealth: Payer: Self-pay | Admitting: Cardiology

## 2013-07-08 NOTE — Telephone Encounter (Signed)
Received request from Nurse fax box, documents faxed for surgical clearance. To: [facility] Fax number: [phone] Attention: 07/08/13/KM

## 2013-07-19 ENCOUNTER — Ambulatory Visit (HOSPITAL_COMMUNITY)
Admission: RE | Admit: 2013-07-19 | Discharge: 2013-07-19 | Disposition: A | Discharge status: Home / Self Care | Payer: 59 | Source: Ambulatory Visit | Attending: Surgery | Admitting: Surgery

## 2013-07-19 DIAGNOSIS — I10 Essential (primary) hypertension: Secondary | ICD-10-CM | POA: Insufficient documentation

## 2013-07-19 DIAGNOSIS — K7689 Other specified diseases of liver: Secondary | ICD-10-CM | POA: Insufficient documentation

## 2013-07-19 DIAGNOSIS — E785 Hyperlipidemia, unspecified: Secondary | ICD-10-CM | POA: Insufficient documentation

## 2013-07-19 DIAGNOSIS — I2510 Atherosclerotic heart disease of native coronary artery without angina pectoris: Secondary | ICD-10-CM | POA: Insufficient documentation

## 2013-07-19 DIAGNOSIS — Z9089 Acquired absence of other organs: Secondary | ICD-10-CM | POA: Insufficient documentation

## 2013-07-19 DIAGNOSIS — K219 Gastro-esophageal reflux disease without esophagitis: Secondary | ICD-10-CM | POA: Insufficient documentation

## 2013-07-19 DIAGNOSIS — Z6839 Body mass index (BMI) 39.0-39.9, adult: Secondary | ICD-10-CM | POA: Insufficient documentation

## 2013-07-19 DIAGNOSIS — M899 Disorder of bone, unspecified: Secondary | ICD-10-CM | POA: Insufficient documentation

## 2013-07-19 DIAGNOSIS — K449 Diaphragmatic hernia without obstruction or gangrene: Secondary | ICD-10-CM | POA: Insufficient documentation

## 2013-07-19 DIAGNOSIS — E119 Type 2 diabetes mellitus without complications: Secondary | ICD-10-CM | POA: Insufficient documentation

## 2013-07-19 DIAGNOSIS — G4733 Obstructive sleep apnea (adult) (pediatric): Secondary | ICD-10-CM | POA: Insufficient documentation

## 2013-07-21 ENCOUNTER — Other Ambulatory Visit: Payer: Self-pay | Admitting: Family Medicine

## 2013-07-29 ENCOUNTER — Telehealth: Payer: Self-pay | Admitting: Cardiology

## 2013-07-29 ENCOUNTER — Ambulatory Visit (HOSPITAL_COMMUNITY)
Admission: RE | Admit: 2013-07-29 | Discharge: 2013-07-29 | Disposition: A | Discharge status: Home / Self Care | Payer: 59 | Source: Ambulatory Visit | Attending: Surgery | Admitting: Surgery

## 2013-07-29 ENCOUNTER — Encounter (HOSPITAL_COMMUNITY)
Admission: RE | Disposition: A | Discharge status: Home / Self Care | Payer: Self-pay | Source: Ambulatory Visit | Attending: Surgery

## 2013-07-29 DIAGNOSIS — Z01818 Encounter for other preprocedural examination: Secondary | ICD-10-CM | POA: Insufficient documentation

## 2013-07-29 HISTORY — PX: BREATH TEK H PYLORI: SHX5422

## 2013-07-29 SURGERY — BREATH TEST, FOR HELICOBACTER PYLORI

## 2013-07-29 NOTE — Telephone Encounter (Signed)
Spoke with Glenda at [facility] who is calling because she has not received patient's cardiac clearance for surgery.  I advised Holley Raring that there is documentation by [facility] that clearance was faxed on 11/24.  Holley Raring states she does not have it.  I verified fax number and refaxed to [phone].  Confirmation received

## 2013-07-29 NOTE — Telephone Encounter (Signed)
New problem   Need to know status of cardiac clearance that was faxed on 07/03/13. Please advise

## 2013-07-30 ENCOUNTER — Encounter (HOSPITAL_COMMUNITY): Payer: Self-pay | Admitting: Surgery

## 2013-08-01 ENCOUNTER — Ambulatory Visit (INDEPENDENT_AMBULATORY_CARE_PROVIDER_SITE_OTHER): Payer: PRIVATE HEALTH INSURANCE | Admitting: Pharmacist

## 2013-08-01 VITALS — BP 114/62 | HR 60 | Ht 67.0 in | Wt 252.2 lb

## 2013-08-01 DIAGNOSIS — E119 Type 2 diabetes mellitus without complications: Secondary | ICD-10-CM

## 2013-08-01 DIAGNOSIS — E785 Hyperlipidemia, unspecified: Secondary | ICD-10-CM

## 2013-08-01 NOTE — Progress Notes (Signed)
Subjective:     [name] is a 62 y.o. female here for discussion regarding weight loss. She has met with Dr Lucia Gaskins regarding Sleeve gastrectomy for obesity.  She is has been cleared medically for surgery and is currently going through process in meeting insurance requirements prior to gastric surgery.  She is trying to get this completed before her insurance changes in March 2015.  She attended an information session by [facility] and is still waiting appointment with nutritionist and psychiatrist.  She has noted a weight gain of approximately 100 pounds over the last 10 years. She feels ideal weight is 180 pounds. Weight at graduation from high school was 160 pounds. History of eating disorders: none. There is a family history positive for obesity in the patient and sister. Previous treatments for obesity include self-directed dieting and supervised diet program. Obesity associated medical conditions: depression, diabetes mellitus, hyperlipidemia and hypertension. Obesity associated medications: none. Cardiovascular risk factors besides obesity: diabetes mellitus, dyslipidemia, hypertension, obesity (BMI >= 30 kg/m2) and sedentary lifestyle.  24 hours dietary recall: Snacks - vegetables and fruits Breakfast - nothing today, yesterday - canadian bacon and 2 eggs with toast. Lunch - ate out stewed beef, red potatoes and broccoli and cheese Supper - meatloaf burger on a hoagie bun Drinks - diet Sun Drop   She is not currently exercising due to back pain and current foot infection (she has follow up with Dr Irving Shows today)  Patient reports that BG is better controlled when she eats as recommended.  She was concerned about her serum creatinine and she stopped her metformin and Invokana for 2 weeks.  During that time her BG was consistently in the 200's.  She restarted both metformin and Invokana and over last 4 days BG has been 191, 163, 167, 158.   Her last A1c was 9.2% (07/03/2013).   Previous A1c was 10.1%. I was also concerned that her serum creatinine was 1.83 four weeks ago but it was down to 1.17 (06/2013).    The following portions of the patient's history were reviewed and updated as appropriate: allergies, current medications, past family history, past medical history, past social history, past surgical history and problem list.  Review of Systems Pertinent items are noted in HPI.    Objective:    Body mass index is 39.5 kg/(m^2).  Filed Vitals:   08/01/13 1035  BP: 114/62  Pulse: 60      Assessment:    Obesity. I assessed Othel to be in an action stage with respect to weight loss.  Diabetes - suboptimal with recent non compliance with medciations Elevated Serum creatinine - Has decreased back to normal but need to follow closely.  If increases to greater than 1.4 again will discontinue metformin and possibly invokana. Check BMET today - pending Hypomagnesemia - due recheck   Plan:    General weight loss/lifestyle modification strategies discussed (elicit support from others; identify saboteurs; non-food rewards, etc). Behavioral treatment: Discussed that she must pay more attention to her mood and stress level when eating.  Should focus on meal and enjoying the taste of food (ie not eating while watching TV). Diet interventions: moderate (500 kCal/d) deficit diet. Informal exercise measures discussed, e.g. taking stairs instead of elevator.   Discussed specific considerations when eating out reviewed portion sizes - smaller portion, choosing lower calorie options (salad, low fat dressing, lean, non fried meats, broth based soups, limiting CHOs) Gave food diary to keep track of dietary intake  Orders  Placed This Encounter  Procedures  . BMP8+EGFR  . Magnesium     Time spent with patient = 45 minutes Cherre Robins, PharmD, CPP  I have seen the patient and agree with the above management plan for her weight loss and her scheduled  surgery.  Arrie Senate MD

## 2013-08-02 LAB — BMP8+EGFR
BUN/Creatinine Ratio: 25 (ref 11–26)
BUN: 34 mg/dL — ABNORMAL HIGH (ref 8–27)
CO2: 22 mmol/L (ref 18–29)
Calcium: 9.8 mg/dL (ref 8.6–10.2)
Chloride: 102 mmol/L (ref 97–108)
Creatinine, Ser: 1.34 mg/dL — ABNORMAL HIGH (ref 0.57–1.00)
GFR calc Af Amer: 49 mL/min/{1.73_m2} — ABNORMAL LOW (ref >59)
GFR calc non Af Amer: 42 mL/min/{1.73_m2} — ABNORMAL LOW (ref >59)
Glucose: 203 mg/dL — ABNORMAL HIGH (ref 65–99)
Potassium: 5.5 mmol/L — ABNORMAL HIGH (ref 3.5–5.2)
Sodium: 142 mmol/L (ref 134–144)

## 2013-08-02 LAB — MAGNESIUM: Magnesium: 1.5 mg/dL — ABNORMAL LOW (ref 1.6–2.6)

## 2013-08-05 ENCOUNTER — Telehealth: Payer: Self-pay | Admitting: Pharmacist

## 2013-08-05 NOTE — Telephone Encounter (Signed)
Patient notified of lab results

## 2013-08-06 ENCOUNTER — Other Ambulatory Visit: Payer: Self-pay | Admitting: Cardiology

## 2013-08-14 ENCOUNTER — Other Ambulatory Visit: Payer: Self-pay | Admitting: Family Medicine

## 2013-08-24 ENCOUNTER — Encounter (INDEPENDENT_AMBULATORY_CARE_PROVIDER_SITE_OTHER): Payer: Self-pay

## 2013-08-24 ENCOUNTER — Encounter: Payer: PRIVATE HEALTH INSURANCE | Attending: Surgery | Admitting: Dietician

## 2013-08-24 ENCOUNTER — Encounter: Payer: Self-pay | Admitting: Dietician

## 2013-08-24 DIAGNOSIS — Z713 Dietary counseling and surveillance: Secondary | ICD-10-CM | POA: Insufficient documentation

## 2013-08-24 DIAGNOSIS — G4733 Obstructive sleep apnea (adult) (pediatric): Secondary | ICD-10-CM | POA: Insufficient documentation

## 2013-08-24 DIAGNOSIS — E119 Type 2 diabetes mellitus without complications: Secondary | ICD-10-CM | POA: Insufficient documentation

## 2013-08-24 DIAGNOSIS — E785 Hyperlipidemia, unspecified: Secondary | ICD-10-CM | POA: Insufficient documentation

## 2013-08-24 DIAGNOSIS — K219 Gastro-esophageal reflux disease without esophagitis: Secondary | ICD-10-CM | POA: Insufficient documentation

## 2013-08-24 NOTE — Patient Instructions (Signed)
Work on the [employer]. Start looking for and trying protein shakes. Start thinking about exercise options.  Contact [facility] when you have your surgery scheduled to enroll in Pre-Op class.

## 2013-08-24 NOTE — Progress Notes (Signed)
  Pre-Op Assessment Visit:  Pre-Operative Sleeve Gastrectomy Surgery  Medical Nutrition Therapy:  Appt start time: [id number]   End time:  [id number].  Patient was seen on 08/24/2013 for Pre-Operative Sleeve Gastrectomy Nutrition Assessment. Assessment and letter of approval faxed to [facility] Bariatric Surgery Program coordinator on 08/24/2013.   Handouts given during visit include:  Pre-Op Goals Bariatric Surgery Protein Shakes  Patient to call the [facility] to enroll in Pre-Op and Post-Op Nutrition Education when surgery date is scheduled.

## 2013-09-02 ENCOUNTER — Ambulatory Visit (INDEPENDENT_AMBULATORY_CARE_PROVIDER_SITE_OTHER): Payer: 59 | Admitting: Pharmacist

## 2013-09-02 ENCOUNTER — Encounter (INDEPENDENT_AMBULATORY_CARE_PROVIDER_SITE_OTHER): Payer: Self-pay

## 2013-09-02 ENCOUNTER — Encounter: Payer: Self-pay | Admitting: Pharmacist

## 2013-09-02 VITALS — BP 124/60 | HR 67 | Ht 67.0 in | Wt 250.0 lb

## 2013-09-02 DIAGNOSIS — R799 Abnormal finding of blood chemistry, unspecified: Secondary | ICD-10-CM

## 2013-09-02 DIAGNOSIS — E119 Type 2 diabetes mellitus without complications: Secondary | ICD-10-CM

## 2013-09-02 DIAGNOSIS — R7989 Other specified abnormal findings of blood chemistry: Secondary | ICD-10-CM

## 2013-09-02 DIAGNOSIS — I10 Essential (primary) hypertension: Secondary | ICD-10-CM

## 2013-09-02 MED ORDER — INSULIN PEN NEEDLE 32G X 4 MM MISC
Status: DC
Start: 2013-09-02 — End: 2014-01-02

## 2013-09-02 NOTE — Progress Notes (Signed)
Subjective:     [name] is a 63 y.o. female here for discussion regarding weight loss. She has met with Dr Lucia Gaskins regarding Sleeve gastrectomy for obesity.  She is has been cleared medically for surgery and is currently going through process in meeting insurance requirements prior to gastric surgery.  She is trying to get this completed before her insurance changes in March 2015.  She has had her appointment with both the nutritionist and psychiatrist this January.  She has noted a weight gain of approximately 100 pounds over the last 10 years. She feels ideal weight is 180 pounds. Weight at graduation from high school was 160 pounds. History of eating disorders: none. There is a family history positive for obesity in the patient and sister. Previous treatments for obesity include self-directed dieting and supervised diet program. Obesity associated medical conditions: depression, diabetes mellitus, hyperlipidemia and hypertension. Obesity associated medications: none. Cardiovascular risk factors besides obesity: diabetes mellitus, dyslipidemia, hypertension, obesity (BMI >= 30 kg/m2) and sedentary lifestyle.  Dietary recall: Patient did not keep food or exercise diary. She has made some changes over the last week.   She is eating more frequently - every 3 to 4 hours.  Snacks - vegetables (cut up tomatoes) and fruits and nuts  Breakfast - cream of chicken soup and crackers,  canadian bacon and 2 eggs with toast. Lunch - sandwiches, soup, sometimes skips or eats late Supper - hamburger, vegetables Drinks - diet sundrop (trying to ween herself off at recommendation of nutritionist prior to gastric surgery)  She is not currently exercising due to back pain and current foot infection (she has follow up with Dr Irving Shows today).  She is wearing a boot on left foot and has been recommended by Dr Irving Shows to periphereal arterial studies due to non healing of left great toe.  Patient reports that BG is  better controlled when she eats as recommended.  She was concerned about her serum creatinine and she stopped her metformin and Invokana for 2 weeks.  During that time her BG was consistently in the 200's.  She restarted both metformin and Invokana and over last 4 days BG has been 191, 163, 167, 158.   Her last A1c was 9.2% (07/03/2013).  Previous A1c was 10.1%. I was also concerned that her serum creatinine was 1.83 four weeks ago but it was down to 1.17 (06/2013).    The following portions of the patient's history were reviewed and updated as appropriate: allergies, current medications, past family history, past medical history, past social history, past surgical history and problem list.  Review of Systems Pertinent items are noted in HPI.    Objective:    Body mass index is 39.15 kg/(m^2).  Filed Vitals:   09/02/13 1307  BP: 124/60  Pulse: 67      Assessment:    Obesity. I assessed [name] to be in an action stage with respect to weight loss.  Diabetes - suboptimal with recent non compliance with medciations Elevated Serum creatinine - Has decreased back to normal but need to follow closely.  If increases to greater than 1.4 again will discontinue metformin and possibly invokana. Check BMET today - pending Hypomagnesemia - due recheck   Plan:    General weight loss/lifestyle modification strategies discussed (elicit support from others; identify saboteurs; non-food rewards, etc). Behavioral treatment: Discussed that she must pay more attention to her mood and stress level when eating.  Should focus on meal and enjoying the taste of food (  ie not eating while watching TV). Diet interventions: moderate (500 kCal/d) deficit diet. Informal exercise measures discussed, e.g. taking stairs instead of elevator.   Discussed specific considerations when eating out reviewed portion sizes - smaller portion, choosing lower calorie options (salad, low fat dressing, lean, non fried meats, broth  based soups, limiting CHOs).  Recommended she try sandwich thins.    No medication changes at this time.  Orders Placed This Encounter  Procedures  . BMP8+EGFR  . Magnesium     Time spent with patient = 30 minutes Cherre Robins, PharmD, CPP  I have seen the patient and agree with the above management plan for her weight loss and her scheduled surgery.  Arrie Senate MD

## 2013-09-03 LAB — BMP8+EGFR
BUN/Creatinine Ratio: 30 — ABNORMAL HIGH (ref 11–26)
BUN: 39 mg/dL — ABNORMAL HIGH (ref 8–27)
CO2: 22 mmol/L (ref 18–29)
Calcium: 9.4 mg/dL (ref 8.7–10.3)
Chloride: 103 mmol/L (ref 97–108)
Creatinine, Ser: 1.3 mg/dL — ABNORMAL HIGH (ref 0.57–1.00)
GFR calc Af Amer: 51 mL/min/{1.73_m2} — ABNORMAL LOW (ref >59)
GFR calc non Af Amer: 44 mL/min/{1.73_m2} — ABNORMAL LOW (ref >59)
Glucose: 130 mg/dL — ABNORMAL HIGH (ref 65–99)
Potassium: 4.8 mmol/L (ref 3.5–5.2)
Sodium: 144 mmol/L (ref 134–144)

## 2013-09-03 LAB — MAGNESIUM: Magnesium: 1.6 mg/dL (ref 1.6–2.6)

## 2013-09-05 ENCOUNTER — Telehealth: Payer: Self-pay | Admitting: Family Medicine

## 2013-09-05 NOTE — Telephone Encounter (Signed)
Left message that labs are stable.  Patient to call if further questions.

## 2013-09-09 ENCOUNTER — Encounter: Payer: Self-pay | Admitting: Family Medicine

## 2013-09-09 ENCOUNTER — Ambulatory Visit (INDEPENDENT_AMBULATORY_CARE_PROVIDER_SITE_OTHER): Payer: 59 | Admitting: Family Medicine

## 2013-09-09 VITALS — BP 155/71 | HR 98 | Temp 98.4°F | Ht 67.0 in | Wt 248.0 lb

## 2013-09-09 DIAGNOSIS — K219 Gastro-esophageal reflux disease without esophagitis: Secondary | ICD-10-CM

## 2013-09-09 DIAGNOSIS — I2510 Atherosclerotic heart disease of native coronary artery without angina pectoris: Secondary | ICD-10-CM

## 2013-09-09 DIAGNOSIS — I499 Cardiac arrhythmia, unspecified: Secondary | ICD-10-CM

## 2013-09-09 DIAGNOSIS — Z780 Asymptomatic menopausal state: Secondary | ICD-10-CM

## 2013-09-09 DIAGNOSIS — L97509 Non-pressure chronic ulcer of other part of unspecified foot with unspecified severity: Secondary | ICD-10-CM

## 2013-09-09 DIAGNOSIS — K449 Diaphragmatic hernia without obstruction or gangrene: Secondary | ICD-10-CM

## 2013-09-09 DIAGNOSIS — E119 Type 2 diabetes mellitus without complications: Secondary | ICD-10-CM

## 2013-09-09 DIAGNOSIS — I10 Essential (primary) hypertension: Secondary | ICD-10-CM

## 2013-09-09 DIAGNOSIS — E785 Hyperlipidemia, unspecified: Secondary | ICD-10-CM

## 2013-09-09 DIAGNOSIS — E559 Vitamin D deficiency, unspecified: Secondary | ICD-10-CM

## 2013-09-09 NOTE — Patient Instructions (Addendum)
Continue current medications. Continue good therapeutic lifestyle changes which include good diet and exercise. Fall precautions discussed with patient. Schedule your flu vaccine if you haven't had it yet If you are over 63 years old - you may need Prevnar 49 or the adult Pneumonia vaccine. Do not forget to get your eye exam Check your feet regularly and monitor your blood sugar closely Return to clinic for fasting blood work Your DEXA scan will be ordered please keep that appointment Check her insurance the Prevnar vaccine Make sure you get your appointment We will arrange a visit for you to see the cardiologist sooner because of your irregular heart beat We will also arrange a visit for you to see the wound center regarding this persistent ulcer on her left great

## 2013-09-09 NOTE — Progress Notes (Signed)
Subjective:    Patient ID: [name], female    DOB: [birth year], 63 y.o.   MRN: [id number]  HPI Pt here for follow up and management of chronic medical problems. Patient comes in today for regular followup. She is in need of an eye exam. She is followed by the clinical pharmacist for poor blood sugar control. She is currently seeing a gastroenterologist who plans a weight loss reduction surgery. She also has an ulcer or on the plantar surface of the left great toe. She needs to return to clinic for fasting lab work. A DEXA scan has been ordered. She supposed to see the cardiologist again sent. She has an appointment with a vascular surgeon next week to reevaluate diminished circulation to the left lower extremity. Also prior to the abdominal surgery for weight loss she has had an upper GI which indicates that she has a hiatal hernia.       Patient Active Problem List   Diagnosis Date Noted  . Hypomagnesemia 08/01/2013  . Morbid obesity 06/26/2013  . DYSPNEA 08/18/2010  . MYOCARDIAL PERFUSION SCAN, WITH STRESS TEST, ABNORMAL 08/04/2010  . ELECTROCARDIOGRAM, ABNORMAL 07/28/2010  . Edema extremities 12/30/2008  . DM 12/26/2008  . DYSLIPIDEMIA 12/26/2008  . HYPERTENSION 12/26/2008  . CAD 12/26/2008  . PERICARDIAL EFFUSION 12/26/2008  . GERD 12/26/2008  . OSTEOARTHRITIS 12/26/2008   Outpatient Encounter Prescriptions as of 09/09/2013  Medication Sig  . ACCU-CHEK AVIVA PLUS test strip CHECK BLOOD SUGAR UP TO TWICE DAILY OR AS PRESCRIBED  . aspirin 325 MG tablet Take 325 mg by mouth daily.    . B-D INS SYR ULTRAFINE 1CC/31G 31G X 5/16" 1 ML MISC USE TWICE DAILY AS DIRECTED  . BENICAR HCT 40-25 MG per tablet TAKE ONE TABLET BY MOUTH ONE TIME DAILY  . Canagliflozin (INVOKANA) 100 MG TABS Take 1 tablet (100 mg total) by mouth daily.  . Cholecalciferol (VITAMIN D) 2000 UNITS CAPS Take 1 capsule by mouth daily.  . fenofibrate (TRICOR) 48 MG tablet TAKE ONE TABLET BY MOUTH ONE TIME  DAILY  . Insulin Pen Needle (BD PEN NEEDLE NANO U/F) 32G X 4 MM MISC Inject 1 Stick into the skin 3 (three) times daily.  . Insulin Pen Needle (BD PEN NEEDLE NANO U/F) 32G X 4 MM MISC Use with insulin pen to inject insulin up to 5 times daily (lantus bid and novolog prior to meals).  Dx 250.03.  Marland Kitchen LANTUS 100 UNIT/ML injection INJECT 100 UNITS UNDER THE SKIN TWICE A DAY AS INSTRUCTED  . Magnesium 250 MG TABS Take by mouth.  . metFORMIN (GLUCOPHAGE) 1000 MG tablet Take 1 tablet (1,000 mg total) by mouth 2 (two) times daily with a meal.  . metoprolol (LOPRESSOR) 50 MG tablet TAKE 1 AND 1/2 TABLETS (75MG TOTAL) BY MOUTH TWICE A DAY  . NEXIUM 40 MG capsule TAKE ONE CAPSULE BY MOUTH ONE TIME DAILY  . NOVOLOG FLEXPEN 100 UNIT/ML SOPN FlexPen INJECT UP TO 55 UNITS UNDER THE SKIN 3 TIMES A DAY BEFORE MEALS ASINSTRUCTED  . OVER THE COUNTER MEDICATION Take 1 tablet by mouth 2 (two) times daily. Mag/ calicum/vitaminD  Tablet 1 daily  . Pitavastatin Calcium 4 MG TABS Take 1 tablet (4 mg total) by mouth daily.  Marland Kitchen albuterol (PROVENTIL HFA;VENTOLIN HFA) 108 (90 BASE) MCG/ACT inhaler Inhale 2 puffs into the lungs every 6 (six) hours as needed for wheezing.    Review of Systems  Constitutional: Negative.   HENT: Negative.   Eyes: Negative.  Respiratory: Negative.   Cardiovascular: Negative.   Gastrointestinal: Negative.   Endocrine: Negative.   Genitourinary: Negative.   Musculoskeletal: Negative.   Skin: Negative.        Check area on left outer calf  Allergic/Immunologic: Negative.   Neurological: Negative.   Hematological: Negative.   Psychiatric/Behavioral: Negative.        Objective:   Physical Exam  Nursing note and vitals reviewed. Constitutional: She is oriented to person, place, and time. She appears well-developed and well-nourished. No distress.  Overweight  HENT:  Head: Normocephalic and atraumatic.  Right Ear: External ear normal.  Left Ear: External ear normal.  Nose: Nose  normal.  Mouth/Throat: Oropharynx is clear and moist.  Eyes: Conjunctivae and EOM are normal. Pupils are equal, round, and reactive to light. Right eye exhibits no discharge. Left eye exhibits no discharge. No scleral icterus.  Neck: Normal range of motion. Neck supple. No JVD present. No thyromegaly present.  No carotid bruits audible  Cardiovascular: Normal rate and normal heart sounds.  Exam reveals no gallop and no friction rub.   No murmur heard. Heart is slightly irregular at 72 per minute Pulses and left foot were difficult to palpate. I could not find a dorsalis pedis. There may be a slight posterior tibial palpable on the left.  Pulmonary/Chest: Effort normal and breath sounds normal. No respiratory distress. She has no wheezes. She has no rales. She exhibits no tenderness.  Abdominal: Soft. Bowel sounds are normal. She exhibits no mass. There is no tenderness. There is no rebound and no guarding.  Obesity  Musculoskeletal: Normal range of motion. She exhibits no edema and no tenderness.  Lymphadenopathy:    She has no cervical adenopathy.  Neurological: She is alert and oriented to person, place, and time. She has normal reflexes. No cranial nerve deficit.  Skin: Skin is warm and dry.  Very dry skin on both lower extremities, there is a great toe ulcer on the plantar surface of the left foot  Psychiatric: She has a normal mood and affect. Her behavior is normal. Judgment and thought content normal.   BP 155/71  Pulse 98  Temp(Src) 98.4 F (36.9 C) (Oral)  Ht $R'5\' 7"'mk$  (1.702 m)  Wt 248 lb (112.492 kg)  BMI 38.83 kg/m2  EKG: PACs or atrial fib flutter.       Assessment & Plan:  1. DM -Poor control  2. HYPERTENSION - BMP8+EGFR; Future - Hepatic function panel; Future  3. GERD - POCT CBC; Future  4. DYSLIPIDEMIA - POCT CBC; Future - NMR, lipoprofile; Future  5. CAD - POCT CBC; Future  6. Vitamin D deficiency - DG Bone Density; Future - Vit D  25 hydroxy (rtn  osteoporosis monitoring); Future  7. Postmenopausal - DG Bone Density; Future  8. Irregular heart beat - EKG 12-Lead -Referral to cardiology  9. Hiatal hernia  10. Toe ulcer - Ambulatory referral to Wound Clinic  No orders of the defined types were placed in this encounter.   Patient Instructions  Continue current medications. Continue good therapeutic lifestyle changes which include good diet and exercise. Fall precautions discussed with patient. Schedule your flu vaccine if you haven't had it yet If you are over 27 years old - you may need Prevnar 58 or the adult Pneumonia vaccine. Do not forget to get your eye exam Check your feet regularly and monitor your blood sugar closely Return to clinic for fasting blood work Your DEXA scan will be ordered please keep that  appointment Check her insurance the Prevnar vaccine Make sure you get your appointment We will arrange a visit for you to see the cardiologist sooner because of your irregular heart beat We will also arrange a visit for you to see the wound center regarding this persistent ulcer on her left great    Arrie Senate MD

## 2013-09-10 ENCOUNTER — Encounter: Payer: 59 | Admitting: Vascular Surgery

## 2013-09-10 ENCOUNTER — Encounter: Payer: Self-pay | Admitting: Physician Assistant

## 2013-09-11 ENCOUNTER — Ambulatory Visit (INDEPENDENT_AMBULATORY_CARE_PROVIDER_SITE_OTHER): Payer: 59 | Admitting: Physician Assistant

## 2013-09-11 ENCOUNTER — Other Ambulatory Visit: Payer: Self-pay | Admitting: General Practice

## 2013-09-11 ENCOUNTER — Encounter: Payer: Self-pay | Admitting: Physician Assistant

## 2013-09-11 ENCOUNTER — Other Ambulatory Visit: Payer: Self-pay | Admitting: Family Medicine

## 2013-09-11 VITALS — BP 120/66 | HR 72 | Ht 67.0 in | Wt 247.0 lb

## 2013-09-11 DIAGNOSIS — I319 Disease of pericardium, unspecified: Secondary | ICD-10-CM

## 2013-09-11 DIAGNOSIS — R6889 Other general symptoms and signs: Secondary | ICD-10-CM

## 2013-09-11 DIAGNOSIS — I359 Nonrheumatic aortic valve disorder, unspecified: Secondary | ICD-10-CM

## 2013-09-11 DIAGNOSIS — I2510 Atherosclerotic heart disease of native coronary artery without angina pectoris: Secondary | ICD-10-CM

## 2013-09-11 DIAGNOSIS — I350 Nonrheumatic aortic (valve) stenosis: Secondary | ICD-10-CM

## 2013-09-11 DIAGNOSIS — R002 Palpitations: Secondary | ICD-10-CM

## 2013-09-11 DIAGNOSIS — I10 Essential (primary) hypertension: Secondary | ICD-10-CM

## 2013-09-11 MED ORDER — MAGNESIUM 250 MG PO TABS: 250.0000 mg | ORAL_TABLET | Freq: Two times a day (BID) | ORAL | Status: DC

## 2013-09-11 NOTE — Patient Instructions (Signed)
Your physician has recommended you make the following change in your medication:   1. Increase Magnesium to 250 mg twice daily.  Your physician has requested that you have an echocardiogram. Echocardiography is a painless test that uses sound waves to create images of your heart. It provides your doctor with information about the size and shape of your heart and how well your heart's chambers and valves are working. This procedure takes approximately one hour. There are no restrictions for this procedure.  Follow-up with  Dr. Percival Spanish as scheduled. Please let us know if clearance needs to updated for Bariatric Surgery

## 2013-09-11 NOTE — Progress Notes (Signed)
[place], [place], [place]  [address] Phone: [phone] Fax:  [phone]  Date:  09/11/2013   ID:  Alison, [name] 10-Jan-1951, MRN [id number]  PCP:  Redge Gainer, MD  Cardiologist:  Percival Spanish   History of Present Illness: [name] is a 63 y.o. female with history of nonobstructive CAD by cath 2006 (normal nuc 2011), morbid obesity, DM, HL, HTN, hiatal hernia with stricture, moderate pericardial effusion in 2012 in the setting of PNA (small vs epicardial fat pad in 2013) who presents to clinic for evaluation of abnormal ECG.  One of her ECGs at PCP's office read out as "atrial fib" but upon closer inspection, both tracings appear to be consistent with NSR with PVCs and PAC. It is not clear why there was so much artifact but she said she was laying back and tends to get uncomfortable doing so due to her large waistline. She reports an occasional "skip" from time to time but denies significant sustained arrhythmias. The skips tends to increase in frequency when laying back. No recent dyspnea, chest pain, LEE, syncope, nausea, bleeding issues. She recently had abnormal ABIs at [facility] done for L toe ulcer and is seeing [facility] 2/3 for further evaluation. Recent labwork on 09/02/13 showed a K of 4.8 and Mg of 1.6. She may also have CKD stage III as evidenced by Cr in the 1.3 range on most recent labs. PCP follows her DM and cholesterol. She was cleared for bariatric surgery in 04/2013 by Dr. Percival Spanish but states the process has been drawn out and she is not sure of when it will be scheduled for, possibly April. Not fully compliant with CPAP. TSH WNL mid-November.  Recent Labs: 02/26/2013: Direct LDL 113*  06/05/2013: HDL Cholesterol 45; LDL (calc) Comment  07/03/2013: ALT 24; Hemoglobin 13.9; TSH 1.199  09/02/2013: Creatinine 1.30*; Potassium 4.8   Wt Readings from Last 3 Encounters:  09/11/13 247 lb (112.038 kg)  09/09/13 248 lb (112.492 kg)  09/02/13 250  lb (113.399 kg)     Past Medical History  Diagnosis Date  . 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 Hyperlipidemia   . Cystocele   . IBS (irritable bowel syndrome)   . Varicose veins   . Superficial phlebitis   . Esophageal stricture   . Fatty liver   . Osteopenia   . Vitamin D deficiency   . Dyslipidemia   . GERD (gastroesophageal reflux disease)   . Osteoarthritis   . Pericardial effusion     a. Mod by echo 2012 at time of PNA. b. Echo 07/2012: small pericardial effusion vs fat.  . Sleep apnea     CPAP  . Hypertension     dr Percival Spanish  . Obesity   . CAD (coronary artery disease)     a. Nonobstructive by cath 2006 - 25% LAD, 25-30% after 1st diag, distal 25% PDA, minor irreg LCx. b. Normal nuc 2011.  Marland Kitchen Hiatal hernia     a. 07/2013: HH with small stricture holding up barium tablet (concides with patient's sx of food sticking).  . 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  . Morbid obesity   . Abnormal Doppler ultrasound of carotid artery     a. 05/2013 - mild plaque, 1-39% BICA, abnormal appearrance of thyroid (TSH wnl in 06/2013).  . Palpitations     a. PVC/PAC on EKG 08/2013.  Current Outpatient Prescriptions  Medication Sig Dispense Refill  . ACCU-CHEK AVIVA PLUS test strip CHECK BLOOD SUGAR UP TO TWICE DAILY OR AS PRESCRIBED  100 each  2  . albuterol (PROVENTIL HFA;VENTOLIN HFA) 108 (90 BASE) MCG/ACT inhaler Inhale 2 puffs into the lungs every 6 (six) hours as needed for wheezing.  1 Inhaler  3  . aspirin 325 MG tablet Take 325 mg by mouth daily.        . B-D INS SYR ULTRAFINE 1CC/31G 31G X 5/16" 1 ML MISC USE TWICE DAILY AS DIRECTED  100 each  0  . BENICAR HCT 40-25 MG per tablet TAKE ONE TABLET BY MOUTH ONE TIME DAILY  30 tablet  5  . Canagliflozin (INVOKANA) 100 MG TABS Take 1 tablet (100 mg total) by mouth daily.  30 tablet  11  . Cholecalciferol (VITAMIN D) 2000 UNITS CAPS Take 1 capsule by mouth daily.      . fenofibrate  (TRICOR) 48 MG tablet TAKE ONE TABLET BY MOUTH ONE TIME DAILY  30 tablet  2  . Insulin Pen Needle (BD PEN NEEDLE NANO U/F) 32G X 4 MM MISC Use with insulin pen to inject insulin up to 5 times daily (lantus bid and novolog prior to meals).  Dx 250.03.  200 each  2  . LANTUS 100 UNIT/ML injection INJECT 100 UNITS UNDER THE SKIN TWICE A DAY AS INSTRUCTED  60 mL  1  . Magnesium 250 MG TABS Take 1 tablet (250 mg total) by mouth once daily.  60 tablet  0  . metFORMIN (GLUCOPHAGE) 1000 MG tablet Take 1 tablet (1,000 mg total) by mouth 2 (two) times daily with a meal.  60 tablet  11  . metoprolol (LOPRESSOR) 50 MG tablet TAKE 1 AND 1/2 TABLETS (75MG  TOTAL) BY MOUTH TWICE A DAY  90 tablet  2  . NEXIUM 40 MG capsule TAKE ONE CAPSULE BY MOUTH ONE TIME DAILY  30 capsule  5  . NOVOLOG FLEXPEN 100 UNIT/ML SOPN FlexPen INJECT UP TO 55 UNITS UNDER THE SKIN 3 TIMES A DAY BEFORE MEALS ASINSTRUCTED  60 mL  2  . Pitavastatin Calcium 4 MG TABS Take 1 tablet (4 mg total) by mouth daily.  30 tablet  11  . [DISCONTINUED] Olmesartan-Amlodipine-HCTZ (TRIBENZOR) 40-5-12.5 MG TABS Take 1 tablet by mouth daily.         No current facility-administered medications for this visit.    Allergies:   Ezetimibe-simvastatin; Penicillins; Celebrex; and Levemir   Social History:  The patient  reports that she has never smoked. She has never used smokeless tobacco. She reports that she does not drink alcohol or use illicit drugs.   Family History:  The patient's family history includes Colitis in her mother; Colon polyps in her father; Diabetes in her father, mother, and other; Heart disease in her father; Heart failure in her mother; Hypertension in her mother; Skin cancer in her mother.   ROS:  Please see the history of present illness. All other systems reviewed and negative.   PHYSICAL EXAM: VS:  BP 120/66  Pulse 72  Ht 5\' 7"  (1.702 m)  Wt 247 lb (112.038 kg)  BMI 38.68 kg/m2 Well nourished, well developed morbidly obese F  with central obesity, in no acute distress HEENT: normal Neck: obese, difficult to assess any JVD Cardiac:  normal S1, S2; RRR; soft systolic murmur RUSB Lungs:  clear to auscultation bilaterally, no wheezing, rhonchi or rales Abd: soft, nontender, no hepatomegaly Ext: no edema,  L foot in boot due to toe ulcer, diminished pedal pulses Skin: warm and dry Neuro:  moves all extremities spontaneously, no focal abnormalities noted  EKG:  NSR 72bpm TWI I, avL, otherwise no acute changes. No significant change from prior. Reviewed ECGs from 09/09/13 with DOD Dr. Radford Pax who agrees these represent NSR with ectopy.  ASSESSMENT AND PLAN:  1. Palpitations: suspect due to her PACs and PVCs. No convincing symptoms of other sustained arrhythmias. She may be at risk in the future given her untreated sleep apnea so we discussed compliance of CPAP. She was instructed to monitor for recurrence and let us know if symptoms change so we could consider event monitoring. Continue BB. Increase magnesium supplement to twice daily (persistently low mag <2.0). 2. Aortic stenosis: mild by last echocardiogram, systolic murmur noted on today's exam. Will update echocardiogram in the setting of recent palpitations, AS, and possible upcoming bariatric surgery (last one >1 yr ago). 3. Morbid obesity: awaiting bariatric surgery. She may require update of her cardiac clearance letter. She will talk to her surgical team and let us know. 4. Nonobstructive CAD by cath 2006: continue risk reduction. PCP managing lipids. No recent anginal sx. 5. PVD: abnormal ABIs and toe ulcer - seeing vascular next week. 6. ? CKD stage III: recent labwork by PCP. 7. H/o remote pericardial effusion: no current sx.  Dispo: F/u Dr. Percival Spanish as scheduled.  Signed, Melina Copa, PA-C  09/11/2013 1:28 PM

## 2013-09-12 ENCOUNTER — Telehealth: Payer: Self-pay | Admitting: Family Medicine

## 2013-09-16 ENCOUNTER — Encounter: Payer: Self-pay | Admitting: Cardiovascular Disease

## 2013-09-16 ENCOUNTER — Ambulatory Visit (HOSPITAL_COMMUNITY): Payer: 59 | Attending: Cardiovascular Disease | Admitting: Radiology

## 2013-09-16 ENCOUNTER — Encounter: Payer: Self-pay | Admitting: Vascular Surgery

## 2013-09-16 DIAGNOSIS — I2510 Atherosclerotic heart disease of native coronary artery without angina pectoris: Secondary | ICD-10-CM | POA: Insufficient documentation

## 2013-09-16 DIAGNOSIS — E119 Type 2 diabetes mellitus without complications: Secondary | ICD-10-CM | POA: Insufficient documentation

## 2013-09-16 DIAGNOSIS — I10 Essential (primary) hypertension: Secondary | ICD-10-CM | POA: Insufficient documentation

## 2013-09-16 DIAGNOSIS — E669 Obesity, unspecified: Secondary | ICD-10-CM | POA: Insufficient documentation

## 2013-09-16 DIAGNOSIS — I359 Nonrheumatic aortic valve disorder, unspecified: Secondary | ICD-10-CM

## 2013-09-16 DIAGNOSIS — I350 Nonrheumatic aortic (valve) stenosis: Secondary | ICD-10-CM

## 2013-09-16 NOTE — Progress Notes (Signed)
Echocardiogram performed.  

## 2013-09-17 ENCOUNTER — Encounter: Payer: Self-pay | Admitting: Vascular Surgery

## 2013-09-17 ENCOUNTER — Telehealth: Payer: Self-pay | Admitting: *Deleted

## 2013-09-17 ENCOUNTER — Ambulatory Visit (INDEPENDENT_AMBULATORY_CARE_PROVIDER_SITE_OTHER): Payer: 59 | Admitting: Vascular Surgery

## 2013-09-17 VITALS — BP 170/76 | HR 109 | Resp 20 | Ht 67.0 in | Wt 252.3 lb

## 2013-09-17 DIAGNOSIS — T07XXXA Unspecified multiple injuries, initial encounter: Secondary | ICD-10-CM

## 2013-09-17 DIAGNOSIS — T148XXA Other injury of unspecified body region, initial encounter: Secondary | ICD-10-CM | POA: Insufficient documentation

## 2013-09-17 NOTE — Progress Notes (Signed)
Patient name: [name] MRN: [id number] DOB: 1950-08-21 Sex: female   Referred by:  Dr. Irving Shows  Reason for referral:  Chief Complaint  Patient presents with  . New Evaluation    evaluate wounds to left great toe and left heel    HISTORY OF PRESENT ILLNESS: Patient has today for evaluation of lower surety arterial insufficiency. She is a very complex medical history. She has a long history of diabetes. She has many month history of a callus formation with ulcer on the plantar aspect of her left great toe. She also has a crack in the lateral heel with typical dry skin fissure. She's had no surrounding erythema and no evidence of osteomyelitis by physical exam in the past. She does not have any claudication type symptoms. She is a morbidly obese and is being considered for bariatric surgery. She is no significant pain with her to does have a tenderness over the heel crack.  Past Medical History  Diagnosis Date  . 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 Hyperlipidemia   . Cystocele   . IBS (irritable bowel syndrome)   . Varicose veins   . Superficial phlebitis   . Esophageal stricture   . Fatty liver   . Osteopenia   . Vitamin D deficiency   . Dyslipidemia   . GERD (gastroesophageal reflux disease)   . Osteoarthritis   . Pericardial effusion     a. Mod by echo 2012 at time of PNA. b. Echo 07/2012: small pericardial effusion vs fat.  . Sleep apnea     CPAP  . Hypertension     dr Percival Spanish  . Obesity   . CAD (coronary artery disease)     a. Nonobstructive by cath 2006 - 25% LAD, 25-30% after 1st diag, distal 25% PDA, minor irreg LCx. b. Normal nuc 2011.  Marland Kitchen Hiatal hernia     a. 07/2013: HH with small stricture holding up barium tablet (concides with patient's sx of food sticking).  . 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  . Morbid obesity   . Abnormal Doppler ultrasound of carotid artery     a. 05/2013 - mild plaque, 1-39% BICA,  abnormal appearrance of thyroid (TSH wnl in 06/2013).  . Palpitations     a. PVC/PAC on EKG 08/2013.    Past Surgical History  Procedure Laterality Date  . Tonsillectomy  1968  . Cholecystectomy  1982  . Carpal tunnel release      Left  . Tubal ligation    . Cataract extraction    . Cardiac catheterization      06  . Parotidectomy  09/07/2012    Procedure: PAROTIDECTOMY;  Surgeon: Melida Quitter, MD;  Location: [facility];  Service: ENT;  Laterality: Left;  LEFT PAROTIDECTOMY  . Breath tek h pylori N/A 07/29/2013    Procedure: BREATH TEK H PYLORI;  Surgeon: Shann Medal, MD;  Location: [facility];  Service: General;  Laterality: N/A;    History   Social History  . Marital Status: Married    Spouse Name: N/A    Number of Children: 3  . Years of Education: N/A   Occupational History  . retired    Social History Main Topics  . Smoking status: Never Smoker   . Smokeless tobacco: Never Used  . Alcohol Use: No  . Drug Use: No  . Sexual Activity: Not on file  Other Topics Concern  . Not on file   Social History Narrative   Married    Family History  Problem Relation Age of Onset  . Heart failure Mother   . Hypertension Mother   . Skin cancer Mother   . Colitis Mother   . Diabetes Mother   . Heart disease Father   . Colon polyps Father   . Diabetes Father   . Diabetes Other     Allergies as of 09/17/2013 - Review Complete 09/17/2013  Allergen Reaction Noted  . Ezetimibe-simvastatin Other (See Comments) 11/18/2010  . Penicillins Other (See Comments)   . Celebrex [celecoxib] Rash 11/20/2012  . Levemir [insulin detemir] Rash 11/18/2010    Current Outpatient Prescriptions on File Prior to Visit  Medication Sig Dispense Refill  . ACCU-CHEK AVIVA PLUS test strip CHECK BLOOD SUGAR UP TO TWICE DAILY OR AS PRESCRIBED  100 each  2  . albuterol (PROVENTIL HFA;VENTOLIN HFA) 108 (90 BASE) MCG/ACT inhaler Inhale 2 puffs into the lungs every 6 (six) hours as needed for  wheezing.  1 Inhaler  3  . aspirin 325 MG tablet Take 325 mg by mouth daily.        . B-D INS SYR ULTRAFINE 1CC/31G 31G X 5/16" 1 ML MISC USE TWICE DAILY AS DIRECTED  100 each  0  . BENICAR HCT 40-25 MG per tablet TAKE ONE TABLET BY MOUTH ONE TIME DAILY  30 tablet  5  . Canagliflozin (INVOKANA) 100 MG TABS Take 1 tablet (100 mg total) by mouth daily.  30 tablet  11  . Cholecalciferol (VITAMIN D) 2000 UNITS CAPS Take 1 capsule by mouth daily.      . fenofibrate (TRICOR) 48 MG tablet TAKE ONE TABLET BY MOUTH ONE TIME DAILY  30 tablet  3  . Insulin Pen Needle (BD PEN NEEDLE NANO U/F) 32G X 4 MM MISC Use with insulin pen to inject insulin up to 5 times daily (lantus bid and novolog prior to meals).  Dx 250.03.  200 each  2  . LANTUS 100 UNIT/ML injection INJECT 100 UNITS UNDER THE SKIN TWICE A DAY AS INSTRUCTED  60 mL  2  . Magnesium 250 MG TABS Take 1 tablet (250 mg total) by mouth 2 (two) times daily.  60 tablet  0  . metFORMIN (GLUCOPHAGE) 1000 MG tablet Take 1 tablet (1,000 mg total) by mouth 2 (two) times daily with a meal.  60 tablet  11  . metoprolol (LOPRESSOR) 50 MG tablet TAKE 1 AND 1/2 TABLETS (75MG  TOTAL) BY MOUTH TWICE A DAY  90 tablet  2  . NEXIUM 40 MG capsule TAKE ONE CAPSULE BY MOUTH ONE TIME DAILY  30 capsule  4  . NOVOLOG FLEXPEN 100 UNIT/ML SOPN FlexPen INJECT UP TO 55 UNITS UNDER THE SKIN 3 TIMES A DAY BEFORE MEALS ASINSTRUCTED  60 mL  2  . Pitavastatin Calcium 4 MG TABS Take 1 tablet (4 mg total) by mouth daily.  30 tablet  11  . [DISCONTINUED] Olmesartan-Amlodipine-HCTZ (TRIBENZOR) 40-5-12.5 MG TABS Take 1 tablet by mouth daily.         No current facility-administered medications on file prior to visit.     REVIEW OF SYSTEMS:  Positives indicated with an "X"  CARDIOVASCULAR:  [ ]  chest pain   [ ]  chest pressure   [x ] palpitations   [x ] orthopnea   x[ ]  dyspnea on exertion   [ ]  claudication   [ ]  rest pain   [  x ] DVT   [ ]  phlebitis PULMONARY:   [ ]  productive cough    [ ]  asthma   [ ]  wheezing NEUROLOGIC:   [ ]  weakness  [ ]  paresthesias  [ ]  aphasia  [ ]  amaurosis  [ ]  dizziness HEMATOLOGIC:   [ ]  bleeding problems   [ ]  clotting disorders MUSCULOSKELETAL:  [ ]  joint pain   [ ]  joint swelling GASTROINTESTINAL: [ ]   blood in stool  [ ]   hematemesis GENITOURINARY:  [ ]   dysuria  [ ]   hematuria PSYCHIATRIC:  [ ]  history of major depression INTEGUMENTARY:  [ ]  rashes  [x ] ulcers CONSTITUTIONAL:  [ ]  fever   [ ]  chills  PHYSICAL EXAMINATION:  General: The patient is a well-nourished female, in no acute distress. Vital signs are BP 170/76  Pulse 109  Resp 20  Ht 5\' 7"  (1.702 m)  Wt 252 lb 4.8 oz (114.443 kg)  BMI 39.51 kg/m2 Pulmonary: There is a good air exchange . Abdomen: Obese, nontender Musculoskeletal: There are no major deformities.  There is no significant extremity pain. Neurologic: No focal weakness or paresthesias are detected, Skin: Dry flaky skin on both lower extremities. Crackles in her left lateral heel and callus formation which is quite thick with a central ulcer on the plantar aspect of her great toe on the left Psychiatric: The patient has normal affect. Cardiovascular: 2+ radial and 2+ femoral pulses. Absent popliteal and distal pulses bilaterally    Vascular Lab Studies:  Studies from [facility] were reviewed. This shows multilevel disease bilaterally. On the left there appears to be superficial femoral and significant tibial disease. Ankle arm index on the left is 0.62 and on the right is 0.75  Impression and Plan:  Arteries lower surety arterial insufficiency bilaterally. Had a very long discussion with the patient regarding this. I do not feel that the arterial insufficiency is causing her inability to heal this at callus and central ulcer formation on her left great toe. This has been stable and is being treated appropriately with continued shaving of this. I explained the importance of keeping lotion on her skin to  prevent these at heel fractures. I did explain with moderate reduction arterial flow for some risk for progression of ulceration or nonhealing wounds. I certainly would not recommend entertainment for bypass currently since this is a stable situation. Her noninvasive studies suggest tibial disease which in all likelihood would be non-reconstructable. She will continue her close followup of her wounds with Dr. Irving Shows. If this progresses would recommend arteriography as the next evaluation. He specifically does not want to consider surgery. I explained that this would be suggested if we felt this was limb threatening. I do not feel that her arterial insufficiency would put her at any increased risk for bariatric surgery if this is required. She will see [place] again on an as-needed basis    Anaalicia Reimann [facility] Office: [phone]

## 2013-09-17 NOTE — Telephone Encounter (Signed)
pt notified about the echo results with verbal understanid to Plan of Care

## 2013-09-30 ENCOUNTER — Encounter (HOSPITAL_BASED_OUTPATIENT_CLINIC_OR_DEPARTMENT_OTHER): Payer: 59 | Attending: General Surgery

## 2013-10-02 ENCOUNTER — Encounter: Payer: 59 | Attending: Surgery | Admitting: Dietician

## 2013-10-02 DIAGNOSIS — E785 Hyperlipidemia, unspecified: Secondary | ICD-10-CM | POA: Insufficient documentation

## 2013-10-02 DIAGNOSIS — Z713 Dietary counseling and surveillance: Secondary | ICD-10-CM | POA: Insufficient documentation

## 2013-10-02 DIAGNOSIS — E119 Type 2 diabetes mellitus without complications: Secondary | ICD-10-CM | POA: Insufficient documentation

## 2013-10-02 DIAGNOSIS — K219 Gastro-esophageal reflux disease without esophagitis: Secondary | ICD-10-CM | POA: Insufficient documentation

## 2013-10-02 DIAGNOSIS — G4733 Obstructive sleep apnea (adult) (pediatric): Secondary | ICD-10-CM | POA: Insufficient documentation

## 2013-10-02 NOTE — Progress Notes (Signed)
  Supervised Weight Loss visit #4  Appt start time: [id number]   End time:  1100  [name] is here today for her 4th SWL visit. The previous 3 visits were with her primary physician. She reports that she is making healthier food choices. She tried spinach for the first time recently and had tuna salad last nigh. She is still drinking caffienated diet sodas. [name] states that she quit for a while and started drinking them again. She is unable to exercise due to a foot and back injury. We discussed looking into water aerobics.   Weight today: 251.8 lbs

## 2013-10-02 NOTE — Patient Instructions (Addendum)
-  Try to quit drinking diet sodas-drink more water -Continue to make healthy food choices -Look into water aerobics

## 2013-10-08 ENCOUNTER — Other Ambulatory Visit: Payer: Self-pay | Admitting: Family Medicine

## 2013-10-09 ENCOUNTER — Encounter: Payer: Self-pay | Admitting: Pharmacist

## 2013-10-09 ENCOUNTER — Ambulatory Visit (INDEPENDENT_AMBULATORY_CARE_PROVIDER_SITE_OTHER): Payer: 59

## 2013-10-09 ENCOUNTER — Ambulatory Visit (INDEPENDENT_AMBULATORY_CARE_PROVIDER_SITE_OTHER): Payer: 59 | Admitting: Pharmacist

## 2013-10-09 VITALS — BP 118/70 | HR 78 | Ht 67.0 in | Wt 251.0 lb

## 2013-10-09 DIAGNOSIS — Z780 Asymptomatic menopausal state: Secondary | ICD-10-CM

## 2013-10-09 DIAGNOSIS — E559 Vitamin D deficiency, unspecified: Secondary | ICD-10-CM

## 2013-10-09 DIAGNOSIS — M8580 Other specified disorders of bone density and structure, unspecified site: Secondary | ICD-10-CM | POA: Insufficient documentation

## 2013-10-09 DIAGNOSIS — E785 Hyperlipidemia, unspecified: Secondary | ICD-10-CM

## 2013-10-09 DIAGNOSIS — E119 Type 2 diabetes mellitus without complications: Secondary | ICD-10-CM

## 2013-10-09 DIAGNOSIS — I10 Essential (primary) hypertension: Secondary | ICD-10-CM

## 2013-10-09 LAB — POCT GLYCOSYLATED HEMOGLOBIN (HGB A1C): Hemoglobin A1C: 8.9

## 2013-10-09 NOTE — Patient Instructions (Signed)

## 2013-10-09 NOTE — Progress Notes (Signed)
Patient ID: ANNA-MARIE COLLER, female   DOB: October 03, 1950, 63 y.o.   MRN: [id number]  CC:  Discuss DEXA results and Weight Management / Obesity  Takela P Bellotti is a 63 y.o. female here for discussion regarding weight loss. She has met with Dr Lucia Gaskins regarding Sleeve gastrectomy for obesity. She is has been cleared medically for surgery and is currently going through process in meeting insurance requirements prior to gastric surgery.   She has had her appointment with both the nutritionist and psychiatrist this January and February.  She has noted a weight gain of approximately 100 pounds over the last 10 years. She feels ideal weight is 180 pounds. Weight at graduation from high school was 160 pounds. History of eating disorders: none. There is a family history positive for obesity in the patient and sister. Previous treatments for obesity include self-directed dieting and supervised diet program. Obesity associated medical conditions: depression, diabetes mellitus, hyperlipidemia and hypertension. Obesity associated medications: none. Cardiovascular risk factors besides obesity: diabetes mellitus, dyslipidemia, hypertension, obesity (BMI >= 30 kg/m2) and sedentary lifestyle.   Dietary recall:  Patient did not keep food or exercise diary.  She has made some changes over the last week.  She is eating more frequently - every 3 to 4 hours.  No diet sodas Snacks - vegetables (cut up tomatoes) and fruits and nuts  Breakfast - cream of chicken soup and crackers, canadian bacon and 2 eggs with toast.  Lunch - sandwiches, soup, sometimes skips or eats late  Supper - hamburger, vegetables   She is not currently exercising due to back pain and current foot infection   HBG readings - lowest:  100, highest: in 289. Patient reports that BG is better controlled when she eats as recommended. Her last A1c was 9.2% (07/03/2013). Previous A1c was 10.1%.   Patient also has osteopenia with last DEXA 11/17/2010.  She is  currently not taking any medications for osteopenia.  No history of fractures.   Age at menopause:  Around 63 yo Hysterectomy?  No Oophorectomy?  No HRT? Yes - Former.  Type/duration: premarin about 3 months or less Steroid Use?  No Thyroid med?  No History of cancer?  No History of digestive disorders (ie Crohn's)?  Yes - GERD on PPI Current or previous eating disorders?  No Last Vitamin D Result:  12.7 (06/05/2013) Last GFR Result:  44 (09/02/2013)   The following portions of the patient's history were reviewed and updated as appropriate: allergies, current medications, past family history, past medical history, past social history, past surgical history and problem list.    Calcium Assessment Calcium Intake  # of servings/day  Calcium mg  Milk (8 oz) 0  x  300  = 0  Yogurt (4 oz) 1 x  200 = 268m  Cheese (1 oz) 1 x  200 = 20104m Other Calcium sources   25061mCa supplement none = none   Estimated calcium intake per day 650m69m DEXA Results Date of Test T-Score for AP Spine L1-L4 T-Score for Total Left Hip T-Score for Total Right Hip  10/09/2013 3.1 -0.7 -1.0  11/17/2010 1.5 -0.6 -0.3  04/16/2008 1.7 -0.4 -0.0  01/02/2006 2.4 -0.8 -0.5   Tscore for neck of Right neck = -1.2 (was -1.8 in 2012)  FRAX 10 year estimate: Total FX risk:  8.6%  (consider medication if >/= 20%) Hip FX risk:  0.9%  (consider medication if >/= 3%)    Assessment: Obesity -  maintaining weight has made some dietary changes Diabetes - HBG readings improving with dietary changes  Osteopenia - stable   Recommendations: 1.  General weight loss/lifestyle modification strategies discussed (elicit support from others; identify saboteurs; non-food rewards, etc).  2.  Diet interventions: moderate (500 kCal/d) deficit diet.  3.  Increase physical activity as able - hopefully can either bike or walk once foot ulcer healed.  4.  recommend calcium 1259m daily through supplementation or diet.  5.  No  medication changes at this time. 6.  Counseled and educated about fall risk and prevention.  Recheck DEXA:  2 years  Time spent counseling patient:  30 minutes  TCherre Robins PharmD, CPP

## 2013-10-12 LAB — NMR, LIPOPROFILE
Cholesterol: 213 mg/dL — ABNORMAL HIGH (ref <200)
HDL Cholesterol by NMR: 56 mg/dL (ref >=40)
HDL Particle Number: 34.3 umol/L (ref >=30.5)
LDL Particle Number: 1814 nmol/L — ABNORMAL HIGH (ref <1000)
LDL Size: 20.8 nm (ref >20.5)
LDLC SERPL CALC-MCNC: 105 mg/dL — ABNORMAL HIGH (ref <100)
LP-IR Score: 84 — ABNORMAL HIGH (ref <=45)
Small LDL Particle Number: 999 nmol/L — ABNORMAL HIGH (ref <=527)
Triglycerides by NMR: 261 mg/dL — ABNORMAL HIGH (ref <150)

## 2013-10-12 LAB — HEPATIC FUNCTION PANEL
ALT: 18 IU/L (ref 0–32)
AST: 11 IU/L (ref 0–40)
Albumin: 3.9 g/dL (ref 3.6–4.8)
Alkaline Phosphatase: 61 IU/L (ref 39–117)
Bilirubin, Direct: 0.14 mg/dL (ref 0.00–0.40)
Total Bilirubin: 0.4 mg/dL (ref 0.0–1.2)
Total Protein: 6.3 g/dL (ref 6.0–8.5)

## 2013-10-12 LAB — BMP8+EGFR
BUN/Creatinine Ratio: 25 (ref 11–26)
BUN: 30 mg/dL — ABNORMAL HIGH (ref 8–27)
CO2: 28 mmol/L (ref 18–29)
Calcium: 10.2 mg/dL (ref 8.7–10.3)
Chloride: 102 mmol/L (ref 97–108)
Creatinine, Ser: 1.19 mg/dL — ABNORMAL HIGH (ref 0.57–1.00)
GFR calc Af Amer: 57 mL/min/{1.73_m2} — ABNORMAL LOW (ref >59)
GFR calc non Af Amer: 49 mL/min/{1.73_m2} — ABNORMAL LOW (ref >59)
Glucose: 107 mg/dL — ABNORMAL HIGH (ref 65–99)
Potassium: 5.2 mmol/L (ref 3.5–5.2)
Sodium: 146 mmol/L — ABNORMAL HIGH (ref 134–144)

## 2013-10-12 LAB — VITAMIN D 25 HYDROXY (VIT D DEFICIENCY, FRACTURES): Vit D, 25-Hydroxy: 19.1 ng/mL — ABNORMAL LOW (ref 30.0–100.0)

## 2013-10-18 ENCOUNTER — Encounter (HOSPITAL_BASED_OUTPATIENT_CLINIC_OR_DEPARTMENT_OTHER): Payer: PRIVATE HEALTH INSURANCE | Attending: General Surgery

## 2013-10-18 DIAGNOSIS — Z794 Long term (current) use of insulin: Secondary | ICD-10-CM | POA: Insufficient documentation

## 2013-10-18 DIAGNOSIS — Z79899 Other long term (current) drug therapy: Secondary | ICD-10-CM | POA: Insufficient documentation

## 2013-10-18 DIAGNOSIS — L97409 Non-pressure chronic ulcer of unspecified heel and midfoot with unspecified severity: Secondary | ICD-10-CM | POA: Diagnosis not present

## 2013-10-18 DIAGNOSIS — E785 Hyperlipidemia, unspecified: Secondary | ICD-10-CM | POA: Diagnosis not present

## 2013-10-18 DIAGNOSIS — M949 Disorder of cartilage, unspecified: Secondary | ICD-10-CM | POA: Diagnosis not present

## 2013-10-18 DIAGNOSIS — L97509 Non-pressure chronic ulcer of other part of unspecified foot with unspecified severity: Secondary | ICD-10-CM | POA: Insufficient documentation

## 2013-10-18 DIAGNOSIS — L84 Corns and callosities: Secondary | ICD-10-CM | POA: Diagnosis not present

## 2013-10-18 DIAGNOSIS — E1169 Type 2 diabetes mellitus with other specified complication: Secondary | ICD-10-CM | POA: Insufficient documentation

## 2013-10-18 DIAGNOSIS — I10 Essential (primary) hypertension: Secondary | ICD-10-CM | POA: Insufficient documentation

## 2013-10-18 DIAGNOSIS — M899 Disorder of bone, unspecified: Secondary | ICD-10-CM | POA: Diagnosis not present

## 2013-10-19 NOTE — Progress Notes (Signed)
[facility]  NAME:  [name], [name]              ACCOUNT NO.:  [id number]  MEDICAL RECORD NO.:  [id number]      DATE OF BIRTH:  [birth year]  PHYSICIAN:  Judene Companion, M.D.           VISIT DATE:                                  OFFICE VISIT   This is a morbidly obese 63 year old lady who is diabetic, has hypertension, hyperlipidemia.  She says that she dropped a vase on her left great toe and has had an ulcer that will not heal.  The ulcer is 8 mm in diameter, and I debrided it of callus and I treated it with a dressing of silver alginate, and we offload it by putting on felt to surround the ulcer to take away the pressure and we put her in an offloading boot.  When she came here today, her blood pressure was 152/74, pulse is 90, temperature 98.  She says she weighs 250 pounds, but she looks much heavier than that.  Her medications are Proventil, aspirin, vitamins, Nexium, TriCor, NovoLog insulin, Lantus insulin, metformin, metoprolol, Benicar, and some statin drugs.  She says that she has arthritis, type 2 diabetes, irritable bowel syndrome, esophageal stricture, and osteopenia.  We put her in the dressing with silver alginate and she will come back in a week.  DIAGNOSES:  Wagner 2 diabetic ulcer, left heel; obesity; hypertension.     Judene Companion, M.D.     PP/MEDQ  D:  10/18/2013  T:  10/19/2013  Job:  [id number]

## 2013-10-24 ENCOUNTER — Other Ambulatory Visit: Payer: Self-pay | Admitting: Cardiology

## 2013-10-25 DIAGNOSIS — E1169 Type 2 diabetes mellitus with other specified complication: Secondary | ICD-10-CM | POA: Diagnosis not present

## 2013-10-30 ENCOUNTER — Ambulatory Visit (INDEPENDENT_AMBULATORY_CARE_PROVIDER_SITE_OTHER): Payer: 59 | Admitting: Cardiology

## 2013-10-30 ENCOUNTER — Encounter: Payer: 59 | Attending: Surgery | Admitting: Dietician

## 2013-10-30 ENCOUNTER — Encounter: Payer: Self-pay | Admitting: Cardiology

## 2013-10-30 VITALS — BP 146/76 | HR 103 | Ht 67.5 in | Wt 254.0 lb

## 2013-10-30 DIAGNOSIS — K219 Gastro-esophageal reflux disease without esophagitis: Secondary | ICD-10-CM | POA: Diagnosis not present

## 2013-10-30 DIAGNOSIS — G4733 Obstructive sleep apnea (adult) (pediatric): Secondary | ICD-10-CM | POA: Insufficient documentation

## 2013-10-30 DIAGNOSIS — Z713 Dietary counseling and surveillance: Secondary | ICD-10-CM | POA: Diagnosis not present

## 2013-10-30 DIAGNOSIS — E119 Type 2 diabetes mellitus without complications: Secondary | ICD-10-CM | POA: Diagnosis not present

## 2013-10-30 DIAGNOSIS — I319 Disease of pericardium, unspecified: Secondary | ICD-10-CM

## 2013-10-30 DIAGNOSIS — I10 Essential (primary) hypertension: Secondary | ICD-10-CM

## 2013-10-30 DIAGNOSIS — E785 Hyperlipidemia, unspecified: Secondary | ICD-10-CM | POA: Diagnosis not present

## 2013-10-30 DIAGNOSIS — I2510 Atherosclerotic heart disease of native coronary artery without angina pectoris: Secondary | ICD-10-CM

## 2013-10-30 NOTE — Patient Instructions (Signed)
The current medical regimen is effective;  continue present plan and medications.  Your physician has requested that you have an echocardiogram in February 2016 . Echocardiography is a painless test that uses sound waves to create images of your heart. It provides your doctor with information about the size and shape of your heart and how well your heart's chambers and valves are working. This procedure takes approximately one hour. There are no restrictions for this procedure.  Follow up in 1 year with Dr Percival Spanish.  You will receive a letter in the mail 2 months before you are due.  Please call us when you receive this letter to schedule your follow up appointment.

## 2013-10-30 NOTE — Patient Instructions (Signed)
Patient to call [facility] when surgery is scheduled to enroll in pre op class.

## 2013-10-30 NOTE — Progress Notes (Signed)
HPI The patient presents for evaluation of aortic stenosis.  She was recently seen in our office for evaluation of an abnormal EKG that was thought possibly to be flutter. However, it was just baseline artifact with PVCs. I reviewed this EKG. She did have a more pronounced murmur that had previously been noted. Followup echo demonstrates that she has may be progression of some mild aortic stenosis. It might be closer to moderate now. However, she has no other significant abnormalities and in particular doesn't have any of the effusion and she had previously. She's being evaluated for gastric surgery and needs clearance for this. She's had no cardiovascular symptoms. Since a stress test in 2011 she's had no chest pressure, neck or arm discomfort. She does get occasionally dyspneic related to she thinks her weight. She still maintains a reasonable activity level (greater than 5 METS).  She does not have any chest pressure, neck or arm discomfort. She's not had any palpitations, presyncope or syncope. She's had no new edema. She did have a procedure on her foot and is having problems with this healing.   Allergies  Allergen Reactions  . Ezetimibe-Simvastatin Other (See Comments)    Leg cramps  . Penicillins Other (See Comments)    fainted  . Celebrex [Celecoxib] Rash  . Levemir [Insulin Detemir] Rash    Current Outpatient Prescriptions  Medication Sig Dispense Refill  . albuterol (PROVENTIL HFA;VENTOLIN HFA) 108 (90 BASE) MCG/ACT inhaler Inhale 2 puffs into the lungs every 6 (six) hours as needed for wheezing.  1 Inhaler  3  . aspirin 325 MG tablet Take 325 mg by mouth daily.        Marland Kitchen BENICAR HCT 40-25 MG per tablet TAKE ONE TABLET BY MOUTH ONE TIME DAILY  30 tablet  5  . Canagliflozin (INVOKANA) 100 MG TABS Take 1 tablet (100 mg total) by mouth daily.  30 tablet  11  . Cholecalciferol (VITAMIN D) 2000 UNITS CAPS Take 1 capsule by mouth daily.      . fenofibrate (TRICOR) 48 MG tablet TAKE ONE  TABLET BY MOUTH ONE TIME DAILY  30 tablet  3  . LANTUS 100 UNIT/ML injection INJECT 100 UNITS UNDER THE SKIN TWICE A DAY AS INSTRUCTED  60 mL  2  . Magnesium 250 MG TABS Take 1 tablet (250 mg total) by mouth 2 (two) times daily.  60 tablet  0  . metFORMIN (GLUCOPHAGE) 1000 MG tablet Take 1 tablet (1,000 mg total) by mouth 2 (two) times daily with a meal.  60 tablet  11  . metoprolol (LOPRESSOR) 50 MG tablet TAKE 1 & 1/2 TABLETS BY MOUTH TWICE DAILY  90 tablet  1  . mupirocin ointment (BACTROBAN) 2 %       . NEXIUM 40 MG capsule TAKE ONE CAPSULE BY MOUTH ONE TIME DAILY  30 capsule  4  . Pitavastatin Calcium 4 MG TABS Take 1 tablet (4 mg total) by mouth daily.  30 tablet  11  . ACCU-CHEK AVIVA PLUS test strip CHECK BLOOD SUGAR UP TO TWICE DAILY OR AS PRESCRIBED  100 each  2  . B-D INS SYR ULTRAFINE 1CC/31G 31G X 5/16" 1 ML MISC USE TWICE DAILY AS INSTRUCTED  100 each  2  . Insulin Pen Needle (BD PEN NEEDLE NANO U/F) 32G X 4 MM MISC Use with insulin pen to inject insulin up to 5 times daily (lantus bid and novolog prior to meals).  Dx 250.03.  200 each  2  .  NOVOLOG FLEXPEN 100 UNIT/ML SOPN FlexPen INJECT UP TO 55 UNITS UNDER THE SKIN 3 TIMES A DAY BEFORE MEALS ASINSTRUCTED  60 mL  2  . [DISCONTINUED] Olmesartan-Amlodipine-HCTZ (TRIBENZOR) 40-5-12.5 MG TABS Take 1 tablet by mouth daily.         No current facility-administered medications for this visit.    Past Medical History  Diagnosis Date  . 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 Hyperlipidemia   . Cystocele   . IBS (irritable bowel syndrome)   . Varicose veins   . Superficial phlebitis   . Esophageal stricture   . Fatty liver   . Osteopenia   . Vitamin D deficiency   . Dyslipidemia   . GERD (gastroesophageal reflux disease)   . Osteoarthritis   . Pericardial effusion     a. Mod by echo 2012 at time of PNA. b. Echo 07/2012: small pericardial effusion vs fat.  . Sleep apnea     CPAP  . Hypertension     dr  Percival Spanish  . Obesity   . CAD (coronary artery disease)     a. Nonobstructive by cath 2006 - 25% LAD, 25-30% after 1st diag, distal 25% PDA, minor irreg LCx. b. Normal nuc 2011.  Marland Kitchen Hiatal hernia     a. 07/2013: HH with small stricture holding up barium tablet (concides with patient's sx of food sticking).  . 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  . Morbid obesity   . Abnormal Doppler ultrasound of carotid artery     a. 05/2013 - mild plaque, 1-39% BICA, abnormal appearrance of thyroid (TSH wnl in 06/2013).  . Palpitations     a. PVC/PAC on EKG 08/2013.    Past Surgical History  Procedure Laterality Date  . Tonsillectomy  1968  . Cholecystectomy  1982  . Carpal tunnel release      Left  . Tubal ligation    . Cataract extraction    . Cardiac catheterization      06  . Parotidectomy  09/07/2012    Procedure: PAROTIDECTOMY;  Surgeon: Melida Quitter, MD;  Location: [facility];  Service: ENT;  Laterality: Left;  LEFT PAROTIDECTOMY  . Breath tek h pylori N/A 07/29/2013    Procedure: BREATH TEK H PYLORI;  Surgeon: Shann Medal, MD;  Location: [facility];  Service: General;  Laterality: N/A;    ROS:  As stated in the HPI and negative for all other systems.  PHYSICAL EXAM BP 146/76  Pulse 103  Ht 5' 7.5" (1.715 m)  Wt 254 lb (115.214 kg)  BMI 39.17 kg/m2 GENERAL:  Well appearing HEENT:  Pupils equal round and reactive, fundi not visualized, oral mucosa unremarkable NECK:  No jugular venous distention, waveform within normal limits, carotid upstroke brisk and symmetric, no bruits, positive transmitted systolic murmur no thyromegaly LYMPHATICS:  No cervical, inguinal adenopathy LUNGS:  Clear to auscultation bilaterally BACK:  No CVA tenderness CHEST:  Unremarkable HEART:  PMI not displaced or sustained,S1 and S2 within normal limits, no S3, no S4, no clicks, no rubs, apical early peaking systolic murmur ABD:  Flat, positive bowel sounds normal in frequency in pitch,  no bruits, no rebound, no guarding, no midline pulsatile mass, no hepatomegaly, no splenomegaly, obese EXT:  2 plus pulses throughout, positive mild edema, no cyanosis no clubbing SKIN:  No rashes no nodules NEURO:  Cranial nerves II through XII grossly intact, motor grossly intact throughout PSYCH:  Cognitively intact, oriented to person place and time  EKG:  Sinus rhythm, rate 101, axis within normal limits, intervals within normal limits, poor anterior R wave progression, lateral T-wave inversions. No change from previous. 10/30/2013   ASSESSMENT AND PLAN  AORTIC STENOSIS - This is moderate and not causing any symptoms. A followup with an echocardiogram next fall. No change in therapy is indicated.  DYSPNEA -  Her dyspnea does seem to be at baseline. This is probably related weight. No change in therapy is indicated.  HYPERTENSION -  Her blood pressure is at target for her age. She will continue the meds as listed.  Obesity -  She is planning on having bariatric surgery. Given her reasonable activity level and absence of high risk findings or features she would be at acceptable risk for the planned procedure without further cardiovascular testing.   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 No change in therapy is indicated. We will follow this up in about one year.

## 2013-10-30 NOTE — Progress Notes (Signed)
  Supervised Weight Loss visit #5  Appt start time: 1230   End time:  100  [name] is here today for her 3rd SWL visit with [place]. She reports that she has had 3 or 4 SWL visits with her primary physician. She reports that she is making healthier food choices. She has maintained her weight since the last visit. 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 Her orthopedic doctor doesn't think she can participate in [place] program.   Weight today: 252 lbs

## 2013-11-01 DIAGNOSIS — E1169 Type 2 diabetes mellitus with other specified complication: Secondary | ICD-10-CM | POA: Diagnosis not present

## 2013-11-06 ENCOUNTER — Ambulatory Visit: Payer: 59 | Admitting: Cardiology

## 2013-11-07 DIAGNOSIS — E1169 Type 2 diabetes mellitus with other specified complication: Secondary | ICD-10-CM | POA: Diagnosis not present

## 2013-11-11 ENCOUNTER — Encounter: Payer: Self-pay | Admitting: Pharmacist

## 2013-11-11 ENCOUNTER — Ambulatory Visit (INDEPENDENT_AMBULATORY_CARE_PROVIDER_SITE_OTHER): Payer: 59 | Admitting: Family Medicine

## 2013-11-11 VITALS — BP 110/60 | HR 75 | Ht 67.5 in | Wt 250.0 lb

## 2013-11-11 DIAGNOSIS — E119 Type 2 diabetes mellitus without complications: Secondary | ICD-10-CM | POA: Diagnosis not present

## 2013-11-11 DIAGNOSIS — I2510 Atherosclerotic heart disease of native coronary artery without angina pectoris: Secondary | ICD-10-CM

## 2013-11-11 MED ORDER — CANAGLIFLOZIN-METFORMIN HCL 150-1000 MG PO TABS: 1.0000 | ORAL_TABLET | ORAL | Status: DC

## 2013-11-11 NOTE — Progress Notes (Signed)
Subjective:     [name] is a 63 y.o. female here for discussion regarding weight loss. She has met with Dr Lucia Gaskins regarding Sleeve gastrectomy for obesity.  She is has been cleared medically for surgery and is currently going through process in meeting insurance requirements prior to gastric surgery.    She has had her appointment with both the nutritionist and psychiatrist this January and follow up in February and March.  She has noted a weight gain of approximately 100 pounds over the last 10 years. She feels ideal weight is 180 pounds. Weight at graduation from high school was 160 pounds. History of eating disorders: none. There is a family history positive for obesity in the patient and sister. Previous treatments for obesity include self-directed dieting and supervised diet program. Obesity associated medical conditions: depression, diabetes mellitus, hyperlipidemia and hypertension. Obesity associated medications: none. Cardiovascular risk factors besides obesity: diabetes mellitus, dyslipidemia, hypertension, obesity (BMI >= 30 kg/m2) and sedentary lifestyle.  Dietary recall: Patient is not keeping food or exercise diary. She has made some changes over the last week.   She is eating more frequently - every 3 to 4 hours. Trying to chew more slowly  24 hour recall Snacks - vegetables (cut up tomatoes) and fruits - grapes, Atkins protein shake - Milk Chocolate (160 calories per shake), mixed nuts, puffed cheese Breakfast - chicken biscuit Lunch - sandwiches, soup,  Supper - hamburger, vegetables Drinks - diet sundrop (2-3 per day)  She is not currently exercising due to back pain and current foot infection (she has follow up with Dr Irving Shows today).  She is wearing a boot on left foot and has been recommended by Dr Irving Shows to periphereal arterial studies due to non healing of left great toe. She recently went to [place] for non healing ulcer on left foot.  She had a skin graft last  week.  Patient reports that BG is better controlled when she eats as recommended.  She was concerned about her serum creatinine and she stopped her metformin and Invokana for 2 weeks.  During that time her BG was consistently in the 200's.  She restarted both metformin and Invokana and over last 4 days BG has been 191, 163, 167, 158.   Her last A1c was 8.9% on 10/09/2013 which is down from 9.2% (07/03/2013).   I was also concerned that her serum creatinine but it has been stable over the last 3 months  The following portions of the patient's history were reviewed and updated as appropriate: allergies, current medications, past family history, past medical history, past social history, past surgical history and problem list.  Review of Systems Pertinent items are noted in HPI.    Objective:    Body mass index is 38.56 kg/(m^2).  Filed Weights   11/11/13 1700  Weight: 250 lb (113.399 kg)     Filed Vitals:   11/11/13 1700  BP: 110/60  Pulse: 75      Assessment:    Obesity. I assessed [name] to be in an action stage with respect to weight loss.  Diabetes - suboptimal with recent non compliance with medciations Elevated Serum creatinine - Has decreased back to normal but need to follow closely.  Check BMET today - pending Hypomagnesemia - due recheck   Plan:    General weight loss/lifestyle modification strategies discussed (elicit support from others; identify saboteurs; non-food rewards, etc). Behavioral treatment: Discussed that she must pay more attention to her mood and stress level  when eating.  Should focus on meal and enjoying the taste of food (ie not eating while watching TV). Diet interventions: moderate (500 kCal/d) deficit diet. Informal exercise measures discussed, e.g. taking stairs instead of elevator.     Discontinue Invokana, change to InvokaMet 150/1000mg 1 tablet qam, change metformin to 1085m qpm.  Recheck BMET in 1 month  No orders of the defined types  were placed in this encounter.    Time spent with patient = 40 minutes [name] PharmD, CPP  I have seen the patient and agree with the above management plan for her weight loss and her scheduled surgery.  DArrie SenateMD

## 2013-11-15 ENCOUNTER — Encounter (HOSPITAL_BASED_OUTPATIENT_CLINIC_OR_DEPARTMENT_OTHER): Payer: PRIVATE HEALTH INSURANCE | Attending: General Surgery

## 2013-11-15 DIAGNOSIS — L97409 Non-pressure chronic ulcer of unspecified heel and midfoot with unspecified severity: Secondary | ICD-10-CM | POA: Diagnosis not present

## 2013-11-15 DIAGNOSIS — L97809 Non-pressure chronic ulcer of other part of unspecified lower leg with unspecified severity: Secondary | ICD-10-CM | POA: Diagnosis not present

## 2013-11-15 DIAGNOSIS — E1169 Type 2 diabetes mellitus with other specified complication: Secondary | ICD-10-CM | POA: Insufficient documentation

## 2013-11-15 DIAGNOSIS — E669 Obesity, unspecified: Secondary | ICD-10-CM | POA: Diagnosis not present

## 2013-11-15 LAB — GLUCOSE, CAPILLARY: Glucose-Capillary: 263 mg/dL — ABNORMAL HIGH (ref 70–99)

## 2013-11-19 ENCOUNTER — Other Ambulatory Visit: Payer: Self-pay | Admitting: Family Medicine

## 2013-11-22 DIAGNOSIS — E669 Obesity, unspecified: Secondary | ICD-10-CM | POA: Diagnosis not present

## 2013-11-22 DIAGNOSIS — E1169 Type 2 diabetes mellitus with other specified complication: Secondary | ICD-10-CM | POA: Diagnosis not present

## 2013-11-22 DIAGNOSIS — L97809 Non-pressure chronic ulcer of other part of unspecified lower leg with unspecified severity: Secondary | ICD-10-CM | POA: Diagnosis not present

## 2013-11-22 DIAGNOSIS — L97409 Non-pressure chronic ulcer of unspecified heel and midfoot with unspecified severity: Secondary | ICD-10-CM | POA: Diagnosis not present

## 2013-11-28 ENCOUNTER — Encounter: Payer: PRIVATE HEALTH INSURANCE | Attending: Surgery | Admitting: Dietician

## 2013-11-28 DIAGNOSIS — G4733 Obstructive sleep apnea (adult) (pediatric): Secondary | ICD-10-CM | POA: Diagnosis not present

## 2013-11-28 DIAGNOSIS — Z713 Dietary counseling and surveillance: Secondary | ICD-10-CM | POA: Insufficient documentation

## 2013-11-28 DIAGNOSIS — K219 Gastro-esophageal reflux disease without esophagitis: Secondary | ICD-10-CM | POA: Insufficient documentation

## 2013-11-28 DIAGNOSIS — E785 Hyperlipidemia, unspecified: Secondary | ICD-10-CM | POA: Diagnosis not present

## 2013-11-28 DIAGNOSIS — E119 Type 2 diabetes mellitus without complications: Secondary | ICD-10-CM | POA: Insufficient documentation

## 2013-11-28 NOTE — Patient Instructions (Signed)
Try Lennie Hummer and Fit. Practice chewing 30 x per bite of food. Work on sipping on liquids throughout the day and not drinking 15 minutes before up through 30 minutes after meals. Look into exercise that you can do - stationary bike and arm exercises.

## 2013-11-28 NOTE — Progress Notes (Signed)
  Supervised Weight Loss visit #6  Appt start time: 200   End time:  19  [name] is here today for her 3rd SWL visit with [place]. She reports that she has had 3 or 4 SWL visits with her primary physician. Has no weight change since last month. Has started making unsweet decaf tea and decaf coffee. Still not able to exercise d/t back and foot injury, though has tried some chair exercises and might try an exercise bike. Has tried the [employer].    Still needs to work on eliminating the carbonated beverage and not drinking while eating.  Weight today: 252 lbs  Plan: Try Lennie Hummer and Fit. Practice chewing 30 x per bite of food. Work on sipping on liquids throughout the day and not drinking 15 minutes before up through 30 minutes after meals. Look into exercise that you can do - stationary bike and arm exercises.       Follow up: Attend Pre-Op Class

## 2013-11-29 DIAGNOSIS — L97809 Non-pressure chronic ulcer of other part of unspecified lower leg with unspecified severity: Secondary | ICD-10-CM | POA: Diagnosis not present

## 2013-11-29 DIAGNOSIS — E669 Obesity, unspecified: Secondary | ICD-10-CM | POA: Diagnosis not present

## 2013-11-29 DIAGNOSIS — E1169 Type 2 diabetes mellitus with other specified complication: Secondary | ICD-10-CM | POA: Diagnosis not present

## 2013-11-29 DIAGNOSIS — L97409 Non-pressure chronic ulcer of unspecified heel and midfoot with unspecified severity: Secondary | ICD-10-CM | POA: Diagnosis not present

## 2013-12-02 DIAGNOSIS — Z713 Dietary counseling and surveillance: Secondary | ICD-10-CM | POA: Diagnosis not present

## 2013-12-03 NOTE — Progress Notes (Signed)
  Pre-Operative Nutrition Class:  Appt start time: 830   End time:  1030.  Patient was seen on 12/02/2013 for Pre-Operative Bariatric Surgery Education at [facility].   Surgery date: 01/20/2014 Surgery type: Gastric sleeve Start weight at [facility]: 251 on 10/02/13 Weight today: 253.5 lbs  TANITA  BODY COMP RESULTS Patient unable to use Tanita at pre op class     BMI (kg/m^2)    Fat Mass (lbs)    Fat Free Mass (lbs)    Total Body Water (lbs)    Samples given per MNT protocol. Patient educated on appropriate usage: Premier shake (vanilla - qty:1) Lot #: [id number] Exp: 05/2014  Unjury protein powder (vanilla - qty 1) Lot #: [id number] Exp: 10/2014  Bariatric Advantage Calcium citrate (wild cherry - qty 1) Lot #: [id number] Exp: 05/2014  Bariactiv MVI (qty 1) Lot #: [id number] Exp: 12/2014    The following the learning objectives were met by the patient during this course:  Identify Pre-Op Dietary Goals and will begin 2 weeks pre-operatively  Identify appropriate sources of fluids and proteins   State protein recommendations and appropriate sources pre and post-operatively  Identify Post-Operative Dietary Goals and will follow for 2 weeks post-operatively  Identify appropriate multivitamin and calcium sources  Describe the need for physical activity post-operatively and will follow MD recommendations  State when to call healthcare provider regarding medication questions or post-operative complications  Handouts given during class include:  Pre-Op Bariatric Surgery Diet Handout  Protein Shake Handout  Post-Op Bariatric Surgery Nutrition Handout  [facility] Program Information Flyer  Support Group Information Flyer  [facility] Bariatric Supplements Price List  Follow-Up Plan: Patient will follow-up at [facility] 2 weeks post operatively for diet advancement per MD.

## 2013-12-04 ENCOUNTER — Encounter: Payer: Self-pay | Admitting: *Deleted

## 2013-12-06 DIAGNOSIS — E1169 Type 2 diabetes mellitus with other specified complication: Secondary | ICD-10-CM | POA: Diagnosis not present

## 2013-12-06 DIAGNOSIS — E669 Obesity, unspecified: Secondary | ICD-10-CM | POA: Diagnosis not present

## 2013-12-06 DIAGNOSIS — L97809 Non-pressure chronic ulcer of other part of unspecified lower leg with unspecified severity: Secondary | ICD-10-CM | POA: Diagnosis not present

## 2013-12-06 DIAGNOSIS — L97409 Non-pressure chronic ulcer of unspecified heel and midfoot with unspecified severity: Secondary | ICD-10-CM | POA: Diagnosis not present

## 2013-12-11 ENCOUNTER — Encounter (INDEPENDENT_AMBULATORY_CARE_PROVIDER_SITE_OTHER): Payer: Self-pay | Admitting: Surgery

## 2013-12-11 ENCOUNTER — Ambulatory Visit (INDEPENDENT_AMBULATORY_CARE_PROVIDER_SITE_OTHER): Payer: PRIVATE HEALTH INSURANCE | Admitting: Surgery

## 2013-12-11 NOTE — Progress Notes (Signed)
Re:   [name] DOB:   Jul 08, 1951 MRN:   [id number]  ASSESSMENT AND PLAN: 1.  Morbid obesity  Initial weight - 249, BMI - 39.2  Per the [place], the patient is a candidate for bariatric surgery.  The patient attended our initial information session and reviewed the types of bariatric surgery.    The patient is interested in the gastric sleeve.  I discussed with the patient the indications and risks of bariatric surgery.  The potential risks of surgery include, but are not limited to, bleeding, infection, leak from the bowel, DVT and PE, open surgery, long term nutrition consequences, and death.  The patient understands the importance of compliance and long term follow-up with our group after surgery.    Plan: 1) upper endoscopy to check HH and esophageal stricture (will have anesthesia available), 2)    I think that this is clarified now.  Her surgery is tentatively scheduled for 6/8. 3) Her daughter said that she was a difficult intubation, so will need see anesthesia preop.  2.  Insulin dependent DM 3.  Hyperlipidemia 4.  OSA  On CPAP since Dec 2013. 5.  CAD 6.  GERD 7.  Left parotid tumor - pleomorphic adenoma  Excised 09/07/2012 - Dr. Guido Sander 8.  Saw Dr. Darene Lamer. 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    No planned surgery. 9.  Saw Dr. Percival Spanish 10/30/2013 for aortic stenosis  He thought that this was moderate and needed no change in therapy. 10.  Boot on left foot - seen at wound clinic at [facility] - Dr. Inez Catalina  Chief Complaint  Patient presents with  . Bariatric Pre-op   REFERRING PHYSICIAN: Redge Gainer, MD  HISTORY OF PRESENT ILLNESS: [name] is a 62 y.o. (DOB: 10-Nov-1950)  white  female whose primary care physician is Redge Gainer, MD and comes to me today for bariatric sugery. Her daughter, [name], is with her.  She came to discuss her UGI results.  She said that Dr. Fuller Plan "stretched her esophus about 7 years ago.  She  forgot to tell me this last time.  It is hard to tell how bad her reflux is.  She takes one Nexium daily and does poorly with out this.  By my review, she does not have a very large HH, but she does apparently have a Shatski't ring where the pill holds up.  I went over again about the sleeve resection.  I have now done 2 cases who have done well.  Our practice has been doing these for 2 years.  I also reviewed the RYGB.  At this time, I think that she is a good candidate for any of the operations that we do.  I did talk about a HH and reflux can be more problematic after a sleeve.  I told her the disappointing thing is that her weight is unchanged since I saw her in November.  We reviewed that bariatric surgery is a tool, that she will still need to work to loose weight.  UGI - 1. Status post cholecystectomy. 2. Hiatal hernia associated with small stricture which upholds the barium tablet, reproducing the patient's symptoms of food sticking. Psych - Seen by Dr. Candis Schatz.  Has snacking issues.  She was also confused about the approval of her surgery.  I talked to [place], but it sounds like it is worked out now.  History of weight loss surgery: The patient is interested in a sleeve  gastrectomy. She heard Dr. Lilyan Punt at our information session. She has been overweight since her teenage years. She says when she got married she weighed 149 pounds. After her three children, she has steadily gained weight. She has tried Weight Watchers, Atkins, and low calorie diets. Her best success with weight loss and a diet was with "don't between meals" diet. She tried a weight loss medicine through Dr. Cloyd Stagers - but it made her tachycardic and she stopped it.  She knows some friends who have had weight loss surgery, but she is unsure of what they have done.   I have encouraged her to go to the support group to see patients with different operations.  I discussed that her GERD may limit a sleeve resection.  We will see  what the UGI shows.  Though her weight is in the low range for bariatric surgery - all her weight is in her abdomen.  Any weight that she loses will be to her benefit.  She does admit to being a "nibbler" - but I told her that this could defeat any of the bariatric surgical options.  I also discussed that I have not been the primary surgeon on a sleeve gastrectomy, though I have assisted in more than 10 cases.  Our surgeon who has the most experience with gastric sleeves, Dr. Lilyan Punt, is leaving in December.  I offered that she could see one of my other partners.   Past Medical History  Diagnosis Date  . 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 Hyperlipidemia   . Cystocele   . IBS (irritable bowel syndrome)   . Varicose veins   . Superficial phlebitis   . Esophageal stricture   . Fatty liver   . Osteopenia   . Vitamin D deficiency   . Dyslipidemia   . GERD (gastroesophageal reflux disease)   . Osteoarthritis   . Pericardial effusion     a. Mod by echo 2012 at time of PNA. b. Echo 07/2012: small pericardial effusion vs fat.  . Sleep apnea     CPAP  . Hypertension     dr Percival Spanish  . Obesity   . CAD (coronary artery disease)     a. Nonobstructive by cath 2006 - 25% LAD, 25-30% after 1st diag, distal 25% PDA, minor irreg LCx. b. Normal nuc 2011.  Marland Kitchen Hiatal hernia     a. 07/2013: HH with small stricture holding up barium tablet (concides with patient's sx of food sticking).  . 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  . Morbid obesity   . Abnormal Doppler ultrasound of carotid artery     a. 05/2013 - mild plaque, 1-39% BICA, abnormal appearrance of thyroid (TSH wnl in 06/2013).  . Palpitations     a. PVC/PAC on EKG 08/2013.      Past Surgical History  Procedure Laterality Date  . Tonsillectomy  1968  . Cholecystectomy  1982  . Carpal tunnel release      Left  . Tubal ligation    . Cataract extraction    . Cardiac catheterization      06  .  Parotidectomy  09/07/2012    Procedure: PAROTIDECTOMY;  Surgeon: Melida Quitter, MD;  Location: [facility];  Service: ENT;  Laterality: Left;  LEFT PAROTIDECTOMY  . Breath tek h pylori N/A 07/29/2013    Procedure: BREATH TEK H PYLORI;  Surgeon: Shann Medal, MD;  Location: [facility]  ENDOSCOPY;  Service: General;  Laterality: N/A;      Current Outpatient Prescriptions  Medication Sig Dispense Refill  . ACCU-CHEK AVIVA PLUS test strip CHECK BLOOD SUGAR UP TO TWICE DAILY OR AS PRESCRIBED  100 each  2  . albuterol (PROVENTIL HFA;VENTOLIN HFA) 108 (90 BASE) MCG/ACT inhaler Inhale 2 puffs into the lungs every 6 (six) hours as needed for wheezing.  1 Inhaler  3  . aspirin 325 MG tablet Take 325 mg by mouth daily.        . B-D INS SYR ULTRAFINE 1CC/31G 31G X 5/16" 1 ML MISC USE TWICE DAILY AS INSTRUCTED  100 each  2  . BENICAR HCT 40-25 MG per tablet TAKE ONE TABLET BY MOUTH ONE TIME DAILY  30 tablet  5  . Canagliflozin-Metformin HCl [phone] MG TABS Take 1 tablet by mouth every morning.  30 tablet  1  . Cholecalciferol (VITAMIN D) 2000 UNITS CAPS Take 1 capsule by mouth daily.      . fenofibrate (TRICOR) 48 MG tablet TAKE ONE TABLET BY MOUTH ONE TIME DAILY  30 tablet  3  . Insulin Pen Needle (BD PEN NEEDLE NANO U/F) 32G X 4 MM MISC Use with insulin pen to inject insulin up to 5 times daily (lantus bid and novolog prior to meals).  Dx 250.03.  200 each  2  . LANTUS 100 UNIT/ML injection INJECT 100 UNITS UNDER THE SKIN TWICE A DAY AS INSTRUCTED  60 mL  2  . Magnesium 250 MG TABS Take 1 tablet (250 mg total) by mouth 2 (two) times daily.  60 tablet  0  . metFORMIN (GLUCOPHAGE) 1000 MG tablet Take 1 tablet (1,000 mg total) by mouth every evening.  60 tablet  11  . metoprolol (LOPRESSOR) 50 MG tablet TAKE 1 & 1/2 TABLETS BY MOUTH TWICE DAILY  90 tablet  1  . mupirocin ointment (BACTROBAN) 2 %       . NEXIUM 40 MG capsule TAKE ONE CAPSULE BY MOUTH ONE TIME DAILY  30 capsule  4  . NOVOLOG FLEXPEN 100 UNIT/ML FlexPen  INJECT UP TO 55 UNITS UNDER THE SKIN 3 TIMES A DAY BEFORE MEALS AS INSTRUCTED  60 mL  2  . Pitavastatin Calcium 4 MG TABS Take 1 tablet (4 mg total) by mouth daily.  30 tablet  11  . [DISCONTINUED] Olmesartan-Amlodipine-HCTZ (TRIBENZOR) 40-5-12.5 MG TABS Take 1 tablet by mouth daily.         No current facility-administered medications for this visit.      Allergies  Allergen Reactions  . Ezetimibe-Simvastatin Other (See Comments)    Leg cramps  . Penicillins Other (See Comments)    fainted  . Celebrex [Celecoxib] Rash  . Levemir [Insulin Detemir] Rash    REVIEW OF SYSTEMS: Skin:  No history of rash.  No history of abnormal moles. Infection:  No history of hepatitis or HIV.  No history of MRSA. Neurologic:  No history of stroke.  No history of seizure.  No history of headaches. Cardiac:  She sees Dr. Percival Spanish from a cardiology standpoint.  She had a heart cath about 2010.  No history of seeing a cardiologist. Pulmonary:  OSA.   On CPAP since Dec 2013.  She did see Dr. Melvyn Novas, but he has released her.  Left parotid tumor excised by Dr. Redmond Baseman - Jan 2014.  Endocrine:  IDDM since 2008.  She first started her DM in 1988. No thyroid disease.  She says that her Magnesium and  Vit D are low. Gastrointestinal:  Has GERD on Nexium.  .   She said that Dr. Fuller Plan "stretched her esophus about 7 years ago.  No history of liver disease.  Open cholecystectomy - 1982.  .  No history of pancreas disease.  No history of colon disease.  Had neg colonoscopy by Dr. Fuller Plan - 2009. Urologic:  No history of kidney stones.  No history of bladder infections. GYN:  Sees Dr. Harrington Challenger.  They have talked about a hysterectomy, but not now. Musculoskeletal:  Chronic degenerative back disease.  She saw Dr. Linden Dolin, but not interested in seeing him any more.  She has a "hole" in her left big toe. Hematologic:  No bleeding disorder.  No history of anemia.  Not anticoagulated. Psycho-social:  The patient is oriented.   The patient  has no obvious psychologic or social impairment to understanding our conversation and plan.  SOCIAL and FAMILY HISTORY: Married. On disability for back issues and DM since 2012. She has 3 daughters - 15, 38, and 39.  Her 23 yo daughter is a Marine scientist at [employer].  Her other two daughters are respiratory therapists at [employer]. [name] is her daughter who is a resp therapist at [employer]. She has 3 grandchildren whom she wants to play with more.  PHYSICAL EXAM: BP 130/72  Pulse 76  Temp(Src) 97.6 F (36.4 C)  Ht 5\' 7"  (1.702 m)  Wt 249 lb 3.2 oz (113.036 kg)  BMI 39.02 kg/m2  General: Obese WF who is alert and generally healthy appearing.  HEENT: Normal. Pupils equal. Neck: Supple. No mass.  No thyroid mass. Lymph Nodes:  No supraclavicular or cervical nodes. Lungs: Clear to auscultation and symmetric breath sounds. Heart:  RRR. No murmur or rub. Abdomen: Soft. No mass. No tenderness. No hernia. Normal bowel sounds.  All her weight is in her abdomen.  She is all apple.  She has a long right subcostal scar from the cholecystectomy.  Rectal: Not done. Extremities:  Good strength and ROM  in upper and lower extremities. Neurologic:  Grossly intact to motor and sensory function. Psychiatric: Has normal mood and affect. Behavior is normal.   DATA REVIEWED: Epic notes.  I gave her a copy of her UGI.    Alphonsa Overall, MD,  [facility], [address]  [place], [place]    [place] Phone:  [phone] FAX:  [phone]

## 2013-12-12 ENCOUNTER — Ambulatory Visit: Payer: Self-pay

## 2013-12-13 ENCOUNTER — Encounter (HOSPITAL_BASED_OUTPATIENT_CLINIC_OR_DEPARTMENT_OTHER): Payer: PRIVATE HEALTH INSURANCE | Attending: General Surgery

## 2013-12-13 ENCOUNTER — Other Ambulatory Visit (HOSPITAL_COMMUNITY): Payer: Self-pay | Admitting: General Surgery

## 2013-12-13 ENCOUNTER — Ambulatory Visit (HOSPITAL_COMMUNITY)
Admission: RE | Admit: 2013-12-13 | Discharge: 2013-12-13 | Disposition: A | Discharge status: Home / Self Care | Payer: PRIVATE HEALTH INSURANCE | Source: Ambulatory Visit | Attending: General Surgery | Admitting: General Surgery

## 2013-12-13 ENCOUNTER — Encounter (HOSPITAL_COMMUNITY): Payer: Self-pay | Admitting: Pharmacy Technician

## 2013-12-13 ENCOUNTER — Encounter (HOSPITAL_COMMUNITY): Payer: Self-pay | Admitting: *Deleted

## 2013-12-13 DIAGNOSIS — M79609 Pain in unspecified limb: Secondary | ICD-10-CM | POA: Insufficient documentation

## 2013-12-13 DIAGNOSIS — E1169 Type 2 diabetes mellitus with other specified complication: Secondary | ICD-10-CM | POA: Diagnosis present

## 2013-12-13 DIAGNOSIS — L97509 Non-pressure chronic ulcer of other part of unspecified foot with unspecified severity: Secondary | ICD-10-CM | POA: Insufficient documentation

## 2013-12-13 DIAGNOSIS — S91109A Unspecified open wound of unspecified toe(s) without damage to nail, initial encounter: Secondary | ICD-10-CM | POA: Diagnosis not present

## 2013-12-13 DIAGNOSIS — M869 Osteomyelitis, unspecified: Secondary | ICD-10-CM

## 2013-12-17 ENCOUNTER — Other Ambulatory Visit: Payer: Self-pay | Admitting: Family Medicine

## 2013-12-20 DIAGNOSIS — L97509 Non-pressure chronic ulcer of other part of unspecified foot with unspecified severity: Secondary | ICD-10-CM | POA: Diagnosis not present

## 2013-12-20 DIAGNOSIS — E1169 Type 2 diabetes mellitus with other specified complication: Secondary | ICD-10-CM | POA: Diagnosis not present

## 2013-12-27 ENCOUNTER — Encounter (HOSPITAL_COMMUNITY): Payer: PRIVATE HEALTH INSURANCE | Admitting: Registered Nurse

## 2013-12-27 ENCOUNTER — Encounter (HOSPITAL_COMMUNITY): Payer: Self-pay | Admitting: *Deleted

## 2013-12-27 ENCOUNTER — Encounter (HOSPITAL_COMMUNITY)
Admission: RE | Disposition: A | Discharge status: Home / Self Care | Payer: Self-pay | Source: Ambulatory Visit | Attending: Surgery

## 2013-12-27 ENCOUNTER — Ambulatory Visit (HOSPITAL_COMMUNITY): Payer: PRIVATE HEALTH INSURANCE | Admitting: Registered Nurse

## 2013-12-27 ENCOUNTER — Ambulatory Visit (HOSPITAL_COMMUNITY)
Admission: RE | Admit: 2013-12-27 | Discharge: 2013-12-27 | Disposition: A | Discharge status: Home / Self Care | Payer: PRIVATE HEALTH INSURANCE | Source: Ambulatory Visit | Attending: Surgery | Admitting: Surgery

## 2013-12-27 DIAGNOSIS — I2510 Atherosclerotic heart disease of native coronary artery without angina pectoris: Secondary | ICD-10-CM | POA: Insufficient documentation

## 2013-12-27 DIAGNOSIS — G4733 Obstructive sleep apnea (adult) (pediatric): Secondary | ICD-10-CM | POA: Insufficient documentation

## 2013-12-27 DIAGNOSIS — K589 Irritable bowel syndrome without diarrhea: Secondary | ICD-10-CM | POA: Diagnosis not present

## 2013-12-27 DIAGNOSIS — Z6838 Body mass index (BMI) 38.0-38.9, adult: Secondary | ICD-10-CM | POA: Insufficient documentation

## 2013-12-27 DIAGNOSIS — K449 Diaphragmatic hernia without obstruction or gangrene: Secondary | ICD-10-CM | POA: Insufficient documentation

## 2013-12-27 DIAGNOSIS — K219 Gastro-esophageal reflux disease without esophagitis: Secondary | ICD-10-CM | POA: Insufficient documentation

## 2013-12-27 DIAGNOSIS — E119 Type 2 diabetes mellitus without complications: Secondary | ICD-10-CM | POA: Insufficient documentation

## 2013-12-27 DIAGNOSIS — E559 Vitamin D deficiency, unspecified: Secondary | ICD-10-CM | POA: Insufficient documentation

## 2013-12-27 DIAGNOSIS — Z794 Long term (current) use of insulin: Secondary | ICD-10-CM | POA: Insufficient documentation

## 2013-12-27 DIAGNOSIS — E785 Hyperlipidemia, unspecified: Secondary | ICD-10-CM | POA: Insufficient documentation

## 2013-12-27 HISTORY — DX: Failed or difficult intubation, initial encounter: T88.4XXA

## 2013-12-27 HISTORY — PX: ESOPHAGOGASTRODUODENOSCOPY: SHX5428

## 2013-12-27 LAB — GLUCOSE, CAPILLARY: Glucose-Capillary: 210 mg/dL — ABNORMAL HIGH (ref 70–99)

## 2013-12-27 SURGERY — EGD (ESOPHAGOGASTRODUODENOSCOPY)
Anesthesia: Monitor Anesthesia Care

## 2013-12-27 MED ORDER — PROPOFOL 10 MG/ML IV EMUL
INTRAVENOUS | Status: DC | PRN
  Administered 2013-12-27: 60 mg via INTRAVENOUS

## 2013-12-27 MED ORDER — BUTAMBEN-TETRACAINE-BENZOCAINE 2-2-14 % EX AERO
INHALATION_SPRAY | CUTANEOUS | Status: DC | PRN
  Administered 2013-12-27: 3 via TOPICAL

## 2013-12-27 MED ORDER — PROPOFOL 10 MG/ML IV BOLUS
INTRAVENOUS | Status: AC
  Filled 2013-12-27: qty 20

## 2013-12-27 MED ORDER — SODIUM CHLORIDE 0.9 % IV SOLN: INTRAVENOUS | Status: DC

## 2013-12-27 MED ORDER — LIDOCAINE HCL (CARDIAC) 20 MG/ML IV SOLN
INTRAVENOUS | Status: AC
  Filled 2013-12-27: qty 5

## 2013-12-27 MED ORDER — LACTATED RINGERS IV SOLN
INTRAVENOUS | Status: DC
  Administered 2013-12-27: 1000 mL via INTRAVENOUS

## 2013-12-27 MED ORDER — LIDOCAINE HCL (CARDIAC) 20 MG/ML IV SOLN
INTRAVENOUS | Status: DC | PRN
  Administered 2013-12-27: 80 mg via INTRAVENOUS

## 2013-12-27 NOTE — H&P (View-Only) (Signed)
Re:   [name] DOB:   01-Oct-1950 MRN:   [id number]  ASSESSMENT AND PLAN: 1.  Morbid obesity  Initial weight - 249, BMI - 39.2  Per the [facility], the patient is a candidate for bariatric surgery.  The patient attended our initial information session and reviewed the types of bariatric surgery.    The patient is interested in the gastric sleeve.  I discussed with the patient the indications and risks of bariatric surgery.  The potential risks of surgery include, but are not limited to, bleeding, infection, leak from the bowel, DVT and PE, open surgery, long term nutrition consequences, and death.  The patient understands the importance of compliance and long term follow-up with our group after surgery.    Plan: 1) upper endoscopy to check HH and esophageal stricture (will have anesthesia available), 2)    I think that this is clarified now.  Her surgery is tentatively scheduled for 6/8. 3) Her daughter said that she was a difficult intubation, so will need see anesthesia preop.  2.  Insulin dependent DM 3.  Hyperlipidemia 4.  OSA  On CPAP since Dec 2013. 5.  CAD 6.  GERD 7.  Left parotid tumor - pleomorphic adenoma  Excised 09/07/2012 - Dr. Guido Sander 8.  Saw Dr. Darene Lamer. 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    No planned surgery. 9.  Saw Dr. Percival Spanish 10/30/2013 for aortic stenosis  He thought that this was moderate and needed no change in therapy. 10.  Boot on left foot - seen at wound clinic at [facility] - Dr. Inez Catalina  Chief Complaint  Patient presents with  . Bariatric Pre-op   REFERRING PHYSICIAN: Redge Gainer, MD  HISTORY OF PRESENT ILLNESS: [name] is a 63 y.o. (DOB: 12-09-1950)  white  female whose primary care physician is Redge Gainer, MD and comes to me today for bariatric sugery. Her daughter, [name], is with her.  She came to discuss her UGI results.  She said that Dr. Fuller Plan "stretched her esophus about 7 years ago.  She  forgot to tell me this last time.  It is hard to tell how bad her reflux is.  She takes one Nexium daily and does poorly with out this.  By my review, she does not have a very large HH, but she does apparently have a Shatski't ring where the pill holds up.  I went over again about the sleeve resection.  I have now done 2 cases who have done well.  Our practice has been doing these for 2 years.  I also reviewed the RYGB.  At this time, I think that she is a good candidate for any of the operations that we do.  I did talk about a HH and reflux can be more problematic after a sleeve.  I told her the disappointing thing is that her weight is unchanged since I saw her in November.  We reviewed that bariatric surgery is a tool, that she will still need to work to loose weight.  UGI - 1. Status post cholecystectomy. 2. Hiatal hernia associated with small stricture which upholds the barium tablet, reproducing the patient's symptoms of food sticking. Psych - Seen by Dr. Candis Schatz.  Has snacking issues.  She was also confused about the approval of her surgery.  I talked to [place], but it sounds like it is worked out now.  History of weight loss surgery: The patient is interested in a sleeve  gastrectomy. She heard Dr. Lilyan Punt at our information session. She has been overweight since her teenage years. She says when she got married she weighed 149 pounds. After her three children, she has steadily gained weight. She has tried Weight Watchers, Atkins, and low calorie diets. Her best success with weight loss and a diet was with "don't between meals" diet. She tried a weight loss medicine through Dr. Cloyd Stagers - but it made her tachycardic and she stopped it.  She knows some friends who have had weight loss surgery, but she is unsure of what they have done.   I have encouraged her to go to the support group to see patients with different operations.  I discussed that her GERD may limit a sleeve resection.  We will see  what the UGI shows.  Though her weight is in the low range for bariatric surgery - all her weight is in her abdomen.  Any weight that she loses will be to her benefit.  She does admit to being a "nibbler" - but I told her that this could defeat any of the bariatric surgical options.  I also discussed that I have not been the primary surgeon on a sleeve gastrectomy, though I have assisted in more than 10 cases.  Our surgeon who has the most experience with gastric sleeves, Dr. Lilyan Punt, is leaving in December.  I offered that she could see one of my other partners.   Past Medical History  Diagnosis Date  . 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 Hyperlipidemia   . Cystocele   . IBS (irritable bowel syndrome)   . Varicose veins   . Superficial phlebitis   . Esophageal stricture   . Fatty liver   . Osteopenia   . Vitamin D deficiency   . Dyslipidemia   . GERD (gastroesophageal reflux disease)   . Osteoarthritis   . Pericardial effusion     a. Mod by echo 2012 at time of PNA. b. Echo 07/2012: small pericardial effusion vs fat.  . Sleep apnea     CPAP  . Hypertension     dr Percival Spanish  . Obesity   . CAD (coronary artery disease)     a. Nonobstructive by cath 2006 - 25% LAD, 25-30% after 1st diag, distal 25% PDA, minor irreg LCx. b. Normal nuc 2011.  Marland Kitchen Hiatal hernia     a. 07/2013: HH with small stricture holding up barium tablet (concides with patient's sx of food sticking).  . 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  . Morbid obesity   . Abnormal Doppler ultrasound of carotid artery     a. 05/2013 - mild plaque, 1-39% BICA, abnormal appearrance of thyroid (TSH wnl in 06/2013).  . Palpitations     a. PVC/PAC on EKG 08/2013.      Past Surgical History  Procedure Laterality Date  . Tonsillectomy  1968  . Cholecystectomy  1982  . Carpal tunnel release      Left  . Tubal ligation    . Cataract extraction    . Cardiac catheterization      06  .  Parotidectomy  09/07/2012    Procedure: PAROTIDECTOMY;  Surgeon: Alison Quitter, MD;  Location: [facility];  Service: ENT;  Laterality: Left;  LEFT PAROTIDECTOMY  . Breath tek h pylori N/A 07/29/2013    Procedure: BREATH TEK H PYLORI;  Surgeon: Shann Medal, MD;  Location: [facility]  ENDOS[facility];  Service: General;  Laterality: N/A;      Current Outpatient Prescriptions  Medication Sig Dispense Refill  . ACCU-CHEK AVIVA PLUS test strip CHECK BLOOD SUGAR UP TO TWICE DAILY OR AS PRESCRIBED  100 each  2  . albuterol (PROVENTIL HFA;VENTOLIN HFA) 108 (90 BASE) MCG/ACT inhaler Inhale 2 puffs into the lungs every 6 (six) hours as needed for wheezing.  1 Inhaler  3  . aspirin 325 MG tablet Take 325 mg by mouth daily.        . B-D INS SYR ULTRAFINE 1CC/31G 31G X 5/16" 1 ML MISC USE TWICE DAILY AS INSTRUCTED  100 each  2  . BENICAR HCT 40-25 MG per tablet TAKE ONE TABLET BY MOUTH ONE TIME DAILY  30 tablet  5  . Canagliflozin-Metformin HCl [phone] MG TABS Take 1 tablet by mouth every morning.  30 tablet  1  . Cholecalciferol (VITAMIN D) 2000 UNITS CAPS Take 1 capsule by mouth daily.      . fenofibrate (TRICOR) 48 MG tablet TAKE ONE TABLET BY MOUTH ONE TIME DAILY  30 tablet  3  . Insulin Pen Needle (BD PEN NEEDLE NANO U/F) 32G X 4 MM MISC Use with insulin pen to inject insulin up to 5 times daily (lantus bid and novolog prior to meals).  Dx 250.03.  200 each  2  . LANTUS 100 UNIT/ML injection INJECT 100 UNITS UNDER THE SKIN TWICE A DAY AS INSTRUCTED  60 mL  2  . Magnesium 250 MG TABS Take 1 tablet (250 mg total) by mouth 2 (two) times daily.  60 tablet  0  . metFORMIN (GLUCOPHAGE) 1000 MG tablet Take 1 tablet (1,000 mg total) by mouth every evening.  60 tablet  11  . metoprolol (LOPRESSOR) 50 MG tablet TAKE 1 & 1/2 TABLETS BY MOUTH TWICE DAILY  90 tablet  1  . mupirocin ointment (BACTROBAN) 2 %       . NEXIUM 40 MG capsule TAKE ONE CAPSULE BY MOUTH ONE TIME DAILY  30 capsule  4  . NOVOLOG FLEXPEN 100 UNIT/ML FlexPen  INJECT UP TO 55 UNITS UNDER THE SKIN 3 TIMES A DAY BEFORE MEALS AS INSTRUCTED  60 mL  2  . Pitavastatin Calcium 4 MG TABS Take 1 tablet (4 mg total) by mouth daily.  30 tablet  11  . [DISCONTINUED] Olmesartan-Amlodipine-HCTZ (TRIBENZOR) 40-5-12.5 MG TABS Take 1 tablet by mouth daily.         No current facility-administered medications for this visit.      Allergies  Allergen Reactions  . Ezetimibe-Simvastatin Other (See Comments)    Leg cramps  . Penicillins Other (See Comments)    fainted  . Celebrex [Celecoxib] Rash  . Levemir [Insulin Detemir] Rash    REVIEW OF SYSTEMS: Skin:  No history of rash.  No history of abnormal moles. Infection:  No history of hepatitis or HIV.  No history of MRSA. Neurologic:  No history of stroke.  No history of seizure.  No history of headaches. Cardiac:  She sees Dr. Percival Spanish from a cardiology standpoint.  She had a heart cath about 2010.  No history of seeing a cardiologist. Pulmonary:  OSA.   On CPAP since Dec 2013.  She did see Dr. Melvyn Novas, but he has released her.  Left parotid tumor excised by [name] - Jan 2014.  Endocrine:  IDDM since 2008.  She first started her DM in 1988. No thyroid disease.  She says that her Magnesium and  Vit D are low. Gastrointestinal:  Has GERD on Nexium.  .   She said that Dr. Fuller Plan "stretched her esophus about 7 years ago.  No history of liver disease.  Open cholecystectomy - 1982.  .  No history of pancreas disease.  No history of colon disease.  Had neg colonoscopy by Dr. Fuller Plan - 2009. Urologic:  No history of kidney stones.  No history of bladder infections. GYN:  Sees Dr. Harrington Challenger.  They have talked about a hysterectomy, but not now. Musculoskeletal:  Chronic degenerative back disease.  She saw Dr. Linden Dolin, but not interested in seeing him any more.  She has a "hole" in her left big toe. Hematologic:  No bleeding disorder.  No history of anemia.  Not anticoagulated. Psycho-social:  The patient is oriented.   The patient  has no obvious psychologic or social impairment to understanding our conversation and plan.  SOCIAL and FAMILY HISTORY: Married. On disability for back issues and DM since 2012. She has 3 daughters - 71, 63, and 42.  Her 30 yo daughter is a Marine scientist at [employer].  Her other two daughters are respiratory therapists at [employer]. [name] is her daughter who is a resp therapist at [employer]. She has 3 grandchildren whom she wants to play with more.  PHYSICAL EXAM: BP 130/72  Pulse 76  Temp(Src) 97.6 F (36.4 C)  Ht 5\' 7"  (1.702 m)  Wt 249 lb 3.2 oz (113.036 kg)  BMI 39.02 kg/m2  General: Obese WF who is alert and generally healthy appearing.  HEENT: Normal. Pupils equal. Neck: Supple. No mass.  No thyroid mass. Lymph Nodes:  No supraclavicular or cervical nodes. Lungs: Clear to auscultation and symmetric breath sounds. Heart:  RRR. No murmur or rub. Abdomen: Soft. No mass. No tenderness. No hernia. Normal bowel sounds.  All her weight is in her abdomen.  She is all apple.  She has a long right subcostal scar from the cholecystectomy.  Rectal: Not done. Extremities:  Good strength and ROM  in upper and lower extremities. Neurologic:  Grossly intact to motor and sensory function. Psychiatric: Has normal mood and affect. Behavior is normal.   DATA REVIEWED: Epic notes.  I gave her a copy of her UGI.    Alphonsa Overall, MD,  [facility], [address]  [place], [place]    [place] Phone:  [phone] FAX:  [phone]

## 2013-12-27 NOTE — Op Note (Signed)
12/27/2013  1:52 PM  PATIENT:  [name], 63 y.o., female, MRN: [id number]  PREOP DIAGNOSIS:  Hiatal hernia with distal esophageal stricture on seen on UGI (07/19/2013)  POSTOP DIAGNOSIS:   Small hiatal hernia, but no stricture or Schatzki ring of distal esophagus.  The esophagus looked okay.  PROCEDURE:  Esophagogastroduedonoscopy  SURGEON:   Alphonsa Overall, M.D.  ANESTHESIA:   IV sedation by anesthesia with 60 mg Propafol  INDICATIONS FOR PROCEDURE:  [name] is a 63 y.o. (DOB: [birth year])  white  female whose primary care physician is Redge Gainer, MD and comes for upper endoscopy to evaluate hiatal hernia and possible esophageal narrowing.  [name] is interested in weight loss surgery and is planning on a sleeve gastrectomy in June 2015.  On her pre op UGI, there appears to be a small hiatal hernia and the pill she took hung up in the distal esophagus, suggesting a distal esophageal narrowing.  She has a history of a prior esophageal dilatation over 10 years ago by Dr. Fuller Plan.  I am doing the endoscopy for pre operative evaluation of her distal esophagus and stomach.   The indications and risks of the endoscopy were explained to the patient.  The risks include, but are not limited to, perforation, bleeding, or injury to the bowel.  PROCEDURE:  The patient was monitored with a pulse oximetry, BP cuff, and EKG.  The patient has nasal O2 flowing during the procedure.   The back of the throat was anesthestized with Ceticaine.  A flexible Pentax endoscope was passed down the throat without difficulty.  Findings include:   Esophagus:   Normal.  Specifically, there is no esophageal narrowing or web.   GE junction at:  42 cm   Stomach: Normal mucosa   Duodenum:   Normal.  Pylorus widely open.  PLAN:  I find no contraindications about going ahead with a sleeve gastrectomy.  Alphonsa Overall, MD, [facility] Surgery Pager: [phone] Office phone:  [phone]

## 2013-12-27 NOTE — Interval H&P Note (Signed)
History and Physical Interval Note:  12/27/2013 1:25 PM  [name]  has presented today for surgery, with the diagnosis of GERD, ESOPHAGEAL STRCTURE  The various methods of treatment have been discussed with the patient and family.   Husband is with patient.  She had a prior dilatation by Dr. Fuller Plan over 10 years ago.  After consideration of risks, benefits and other options for treatment, the patient has consented to  Procedure(s): ESOPHAGOGASTRODUODENOSCOPY (EGD) (N/A) as a surgical intervention .  The patient's history has been reviewed, patient examined, no change in status, stable for surgery.  I have reviewed the patient's chart and labs.  Questions were answered to the patient's satisfaction.     Shann Medal

## 2013-12-27 NOTE — Transfer of Care (Signed)
Immediate Anesthesia Transfer of Care Note  Patient: [name]  Procedure(s) Performed: Procedure(s): ESOPHAGOGASTRODUODENOSCOPY (EGD) (N/A)  Patient Location: PACU  Anesthesia Type:MAC  Level of Consciousness: awake and alert   Airway & Oxygen Therapy: Patient Spontanous Breathing and Patient connected to nasal cannula oxygen  Post-op Assessment: Report given to PACU RN and Post -op Vital signs reviewed and stable  Post vital signs: Reviewed and stable  Complications: No apparent anesthesia complications

## 2013-12-27 NOTE — Anesthesia Preprocedure Evaluation (Addendum)
Anesthesia Evaluation    History of Anesthesia Complications (+) PONV and DIFFICULT AIRWAY  Airway Mallampati: III TM Distance: <3 FB Neck ROM: Full   Comment: Enormous neck and jowls Dental no notable dental hx. (+) Teeth Intact   Pulmonary sleep apnea ,  breath sounds clear to auscultation  Pulmonary exam normal       Cardiovascular hypertension, Pt. on medications + CAD Rhythm:Regular Rate:Normal     Neuro/Psych    GI/Hepatic   Endo/Other  diabetes, Insulin DependentMorbid obesity  Renal/GU      Musculoskeletal   Abdominal   Peds  Hematology   Anesthesia Other Findings   Reproductive/Obstetrics                          Anesthesia Physical Anesthesia Plan  ASA: III  Anesthesia Plan: MAC   Post-op Pain Management:    Induction:   Airway Management Planned: Natural Airway  Additional Equipment:   Intra-op Plan:   Post-operative Plan:   Informed Consent: I have reviewed the patients History and Physical, chart, labs and discussed the procedure including the risks, benefits and alternatives for the proposed anesthesia with the patient or authorized representative who has indicated his/her understanding and acceptance.   Dental advisory given  Plan Discussed with:   Anesthesia Plan Comments: (Previous GA with grade 4 view under DL. Needed asleep nasal fiberoptic intubation)       Anesthesia Quick Evaluation

## 2013-12-27 NOTE — Discharge Instructions (Signed)
Esophagogastroduodenoscopy °Care After °Refer to this sheet in the next few weeks. These instructions provide you with information on caring for yourself after your procedure. Your caregiver may also give you more specific instructions. Your treatment has been planned according to current medical practices, but problems sometimes occur. Call your caregiver if you have any problems or questions after your procedure.  °HOME CARE INSTRUCTIONS °· Do not eat or drink anything until the numbing medicine (local anesthetic) has worn off and your gag reflex has returned. You will know that the local anesthetic has worn off when you can swallow comfortably. °· Do not drive for 12 hours after the procedure or as directed by your caregiver. °· Only take medicines as directed by your caregiver. °SEEK MEDICAL CARE IF:  °· You cannot stop coughing. °· You are not urinating at all or less than usual. °SEEK IMMEDIATE MEDICAL CARE IF: °· You have difficulty swallowing. °· You cannot eat or drink. °· You have worsening throat or chest pain. °· You have dizziness, lightheadedness, or you faint. °· You have nausea or vomiting. °· You have chills. °· You have a fever. °· You have severe abdominal pain. °· You have black, tarry, or bloody stools. °Document Released: 07/18/2012 Document Reviewed: 07/18/2012 °ExitCare® Patient Information ©2014 [employer]. ° °

## 2013-12-30 ENCOUNTER — Encounter (HOSPITAL_COMMUNITY): Payer: Self-pay | Admitting: Surgery

## 2013-12-30 NOTE — Anesthesia Postprocedure Evaluation (Signed)
  Anesthesia Post-op Note  Patient: [name]  Procedure(s) Performed: Procedure(s) (LRB): ESOPHAGOGASTRODUODENOSCOPY (EGD) (N/A)  Patient Location: PACU  Anesthesia Type: MAC  Level of Consciousness: awake and alert   Airway and Oxygen Therapy: Patient Spontanous Breathing  Post-op Pain: mild  Post-op Assessment: Post-op Vital signs reviewed, Patient's Cardiovascular Status Stable, Respiratory Function Stable, Patent Airway and No signs of Nausea or vomiting  Last Vitals:  Filed Vitals:   12/27/13 1433  BP: 163/81  Pulse: 88  Temp:   Resp: 20    Post-op Vital Signs: stable   Complications: No apparent anesthesia complications

## 2013-12-31 NOTE — Progress Notes (Signed)
Dr. Lucia Gaskins - Please enter preop orders in Epic for [facility].  Thanks.

## 2014-01-01 ENCOUNTER — Other Ambulatory Visit (INDEPENDENT_AMBULATORY_CARE_PROVIDER_SITE_OTHER): Payer: Self-pay | Admitting: Surgery

## 2014-01-02 ENCOUNTER — Other Ambulatory Visit (INDEPENDENT_AMBULATORY_CARE_PROVIDER_SITE_OTHER): Payer: Self-pay | Admitting: Surgery

## 2014-01-03 DIAGNOSIS — E1169 Type 2 diabetes mellitus with other specified complication: Secondary | ICD-10-CM | POA: Diagnosis not present

## 2014-01-03 DIAGNOSIS — L97509 Non-pressure chronic ulcer of other part of unspecified foot with unspecified severity: Secondary | ICD-10-CM | POA: Diagnosis not present

## 2014-01-07 ENCOUNTER — Encounter (HOSPITAL_COMMUNITY): Payer: Self-pay | Admitting: Pharmacy Technician

## 2014-01-08 ENCOUNTER — Ambulatory Visit (HOSPITAL_COMMUNITY)
Admission: RE | Admit: 2014-01-08 | Discharge: 2014-01-08 | Disposition: A | Discharge status: Home / Self Care | Payer: PRIVATE HEALTH INSURANCE | Source: Ambulatory Visit | Attending: Anesthesiology | Admitting: Anesthesiology

## 2014-01-08 ENCOUNTER — Ambulatory Visit: Payer: Self-pay | Admitting: Family Medicine

## 2014-01-08 ENCOUNTER — Encounter (HOSPITAL_COMMUNITY)
Admission: RE | Admit: 2014-01-08 | Discharge: 2014-01-08 | Disposition: A | Discharge status: Home / Self Care | Payer: PRIVATE HEALTH INSURANCE | Source: Ambulatory Visit | Attending: Surgery | Admitting: Surgery

## 2014-01-08 ENCOUNTER — Encounter (HOSPITAL_COMMUNITY): Payer: Self-pay

## 2014-01-08 DIAGNOSIS — Z01818 Encounter for other preprocedural examination: Secondary | ICD-10-CM | POA: Diagnosis not present

## 2014-01-08 LAB — CBC WITH DIFFERENTIAL/PLATELET
Basophils Absolute: 0.1 10*3/uL (ref 0.0–0.1)
Basophils Relative: 1 % (ref 0–1)
Eosinophils Absolute: 0.3 10*3/uL (ref 0.0–0.7)
Eosinophils Relative: 4 % (ref 0–5)
HCT: 42.6 % (ref 36.0–46.0)
Hemoglobin: 13.8 g/dL (ref 12.0–15.0)
Lymphocytes Relative: 34 % (ref 12–46)
Lymphs Abs: 3.1 10*3/uL (ref 0.7–4.0)
MCH: 27 pg (ref 26.0–34.0)
MCHC: 32.4 g/dL (ref 30.0–36.0)
MCV: 83.4 fL (ref 78.0–100.0)
Monocytes Absolute: 0.7 10*3/uL (ref 0.1–1.0)
Monocytes Relative: 7 % (ref 3–12)
Neutro Abs: 4.9 10*3/uL (ref 1.7–7.7)
Neutrophils Relative %: 54 % (ref 43–77)
Platelets: 306 10*3/uL (ref 150–400)
RBC: 5.11 MIL/uL (ref 3.87–5.11)
RDW: 14.2 % (ref 11.5–15.5)
WBC: 9 10*3/uL (ref 4.0–10.5)

## 2014-01-08 LAB — COMPREHENSIVE METABOLIC PANEL
ALT: 28 U/L (ref 0–35)
AST: 26 U/L (ref 0–37)
Albumin: 3.6 g/dL (ref 3.5–5.2)
Alkaline Phosphatase: 63 U/L (ref 39–117)
BUN: 73 mg/dL — ABNORMAL HIGH (ref 6–23)
CO2: 23 mEq/L (ref 19–32)
Calcium: 10 mg/dL (ref 8.4–10.5)
Chloride: 101 mEq/L (ref 96–112)
Creatinine, Ser: 1.46 mg/dL — ABNORMAL HIGH (ref 0.50–1.10)
GFR calc Af Amer: 43 mL/min — ABNORMAL LOW (ref >90)
GFR calc non Af Amer: 37 mL/min — ABNORMAL LOW (ref >90)
Glucose, Bld: 81 mg/dL (ref 70–99)
Potassium: 5.1 mEq/L (ref 3.7–5.3)
Sodium: 140 mEq/L (ref 137–147)
Total Bilirubin: 0.2 mg/dL — ABNORMAL LOW (ref 0.3–1.2)
Total Protein: 7.1 g/dL (ref 6.0–8.3)

## 2014-01-08 NOTE — Pre-Procedure Instructions (Addendum)
01-08-14 EKG 1'15, Echo 2'15 -Epic.CXR done today. 01-08-14 1115 Dr. Landry Dyke aware of patient history with anesthesia"small mouth, large neck, sleep apnea" -will see AM of. 01-08-14 1400 Labs viewable in Epic-note to Dr. Pollie Friar in basket to note BUN/Creat results.

## 2014-01-08 NOTE — Progress Notes (Signed)
01-08-14 1400 Labs viewable in [place], please note BUN/Creat.

## 2014-01-08 NOTE — Patient Instructions (Addendum)
[name]  01/08/2014   Your procedure is scheduled on:   01-20-2014  Enter through [facility] Entrance and follow signs to [place]. Arrive at      [place]  AM .  Call this number if you have problems the morning of surgery: [phone]  Or Presurgical Testing [phone]([facility]) For Living Will and/or Health Care Power Attorney Forms: please provide copy for your medical record,may bring AM of surgery(Forms should be already notarized -we do not provide this service).(Yes/ No information preferred today).  Remember: Follow any bowel prep instructions per MD office. For Cpap use: Bring mask and tubing only.   Do not eat food:After Midnight.    Take these medicines the morning of surgery with A SIP OF WATER: Metoprolol. Fenofibrate. Nexium. Pivastatin. Lantus insulin(1/2 usual PM dose) night before.-none AM of. No diabetic meds AM of.   Do not wear jewelry, make-up or nail polish.  Do not wear lotions, powders, or perfumes. You may wear deodorant.  Do not shave 48 hours(2 days) prior to first CHG shower(legs and under arms).(Shaving face and neck okay.)  Do not bring valuables to the hospital.(Hospital is not responsible for lost valuables).  Contacts, dentures or removable bridgework, body piercing, hair pins may not be worn into surgery.  Leave suitcase in the car. After surgery it may be brought to your room.  For patients admitted to the hospital, checkout time is 11:00 AM the day of discharge.(Restricted visitors-Any Persons displaying flu-like symptoms or illness).    Patients discharged the day of surgery will not be allowed to drive home. Must have responsible person with you x 24 hours once discharged.  Name and phone number of your driver: Audry Pili -spouse [phone] cell  Special Instructions: CHG(Chlorhedine 4%-"Hibiclens","Betasept","Aplicare") Shower Use Special Wash: see special instructions.(avoid face and genitals)      Failure to follow  these instructions may result in Cancellation of your surgery.   ____________                                                                                   [facility] - Preparing for Surgery Before surgery, you can play an important role.  Because skin is not sterile, your skin needs to be as free of germs as possible.  You can reduce the number of germs on your skin by washing with CHG (chlorahexidine gluconate) soap before surgery.  CHG is an antiseptic cleaner which kills germs and bonds with the skin to continue killing germs even after washing. Please DO NOT use if you have an allergy to CHG or antibacterial soaps.  If your skin becomes reddened/irritated stop using the CHG and inform your nurse when you arrive at Short Stay. Do not shave (including legs and underarms) for at least 48 hours prior to the first CHG shower.  You may shave your face/neck. Please follow these instructions carefully:  1.  Shower with CHG Soap the night before surgery and the  morning of Surgery.  2.  If you choose to wash your hair, wash your hair first as usual with your  normal  shampoo.  3.  After you shampoo, rinse your  hair and body thoroughly to remove the  shampoo.                           4.  Use CHG as you would any other liquid soap.  You can apply chg directly  to the skin and wash                       Gently with a scrungie or clean washcloth.  5.  Apply the CHG Soap to your body ONLY FROM THE NECK DOWN.   Do not use on face/ open                           Wound or open sores. Avoid contact with eyes, ears mouth and genitals (private parts).                       Wash face,  Genitals (private parts) with your normal soap.             6.  Wash thoroughly, paying special attention to the area where your surgery  will be performed.  7.  Thoroughly rinse your body with warm water from the neck down.  8.  DO NOT shower/wash with your normal soap after using and rinsing off  the CHG Soap.                 9.  Pat yourself dry with a clean towel.            10.  Wear clean pajamas.            11.  Place clean sheets on your bed the night of your first shower and do not  sleep with pets. Day of Surgery : Do not apply any lotions/deodorants the morning of surgery.  Please wear clean clothes to the hospital/surgery center.  FAILURE TO FOLLOW THESE INSTRUCTIONS MAY RESULT IN THE CANCELLATION OF YOUR SURGERY PATIENT SIGNATURE_________________________________  NURSE SIGNATURE__________________________________  ________________________________________________________________________

## 2014-01-09 ENCOUNTER — Ambulatory Visit (INDEPENDENT_AMBULATORY_CARE_PROVIDER_SITE_OTHER): Payer: PRIVATE HEALTH INSURANCE | Admitting: Surgery

## 2014-01-09 ENCOUNTER — Encounter (INDEPENDENT_AMBULATORY_CARE_PROVIDER_SITE_OTHER): Payer: Self-pay | Admitting: Surgery

## 2014-01-09 NOTE — Progress Notes (Addendum)
Re:   [name] DOB:   [birth year] MRN:   [id number]  ASSESSMENT AND PLAN: 1.  Morbid obesity  Initial weight - 249, BMI - 39.2  Per the [facility], the patient is a candidate for bariatric surgery.  The patient attended our initial information session and reviewed the types of bariatric surgery.    The patient is interested in the gastric sleeve.  I discussed with the patient the indications and risks of bariatric surgery.  The potential risks of surgery include, but are not limited to, bleeding, infection, leak from the bowel, DVT and PE, open surgery, long term nutrition consequences, and death.  The patient understands the importance of compliance and long term follow-up with our group after surgery.    She has completed her pre op workup.  There are insurance issues in that she is Medicare, but is switching to [employer].  We are to clear these up before surgery.  She is to see Dr. Laurance Flatten in the next week.  I have encouraged to make an appt to see him about 3 to 4 weeks post op.  2.  Insulin dependent DM 3.  Hyperlipidemia 4.  OSA  On CPAP since Dec 2013. 5.  CAD 6.  GERD 7.  Left parotid tumor - pleomorphic adenoma  Excised 09/07/2012 - Dr. Guido Sander 8.  Saw Dr. Darene Lamer. 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    No planned surgery. 9.  Saw Dr. Percival Spanish 10/30/2013 for aortic stenosis  He thought that this was moderate and needed no change in therapy. 10.  Boot on left foot - seen at wound clinic at [facility] - Dr. Inez Catalina  Chief Complaint  Patient presents with  . Bariatric Pre-op    lap sleeve   REFERRING PHYSICIAN: Redge Gainer, MD  HISTORY OF PRESENT ILLNESS: [name] is a 63 y.o. (DOB: 01/26/1951)  white  female whose primary care physician is Redge Gainer, MD and comes to me today for bariatric sugery. She comes by herself today.  Her upper endo did not show a distal esophageal stricture.  She is doing well.  She has changed  insurance companies (or products) and this could hold up her surgery. She is ready for surgery.  I think that she has a handle on the operation itself and the potential complications.  I have encourage to continue to lose weight, she has lost some. She is doing okay with the boot on her left foot.  UGI - 1. Status post cholecystectomy. 2. Hiatal hernia associated with small stricture which upholds the barium tablet, reproducing the patient's symptoms of food sticking. Psych - Seen by Dr. Candis Schatz.  Has snacking issues.  History of weight loss surgery: The patient is interested in a sleeve gastrectomy. She heard Dr. Lilyan Punt at our information session. She has been overweight since her teenage years. She says when she got married she weighed 149 pounds. After her three children, she has steadily gained weight. She has tried Weight Watchers, Atkins, and low calorie diets. Her best success with weight loss and a diet was with "don't between meals" diet. She tried a weight loss medicine through Dr. Cloyd Stagers - but it made her tachycardic and she stopped it.  She knows some friends who have had weight loss surgery, but she is unsure of what they have done.   I have encouraged her to go to the support group to see patients with different operations.  I  discussed that her GERD may limit a sleeve resection.  We will see what the UGI shows.  Though her weight is in the low range for bariatric surgery - all her weight is in her abdomen.  Any weight that she loses will be to her benefit.  She does admit to being a "nibbler" - but I told her that this could defeat any of the bariatric surgical options.  I also discussed that I have not been the primary surgeon on a sleeve gastrectomy, though I have assisted in more than 10 cases.  Our surgeon who has the most experience with gastric sleeves, Dr. Lilyan Punt, is leaving in December.  I offered that she could see one of my other partners.   Past Medical History  Diagnosis  Date  . 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 Hyperlipidemia   . Cystocele   . IBS (irritable bowel syndrome)   . Varicose veins   . Superficial phlebitis   . Esophageal stricture   . Fatty liver   . Osteopenia   . Vitamin D deficiency   . Dyslipidemia   . GERD (gastroesophageal reflux disease)   . Osteoarthritis   . Pericardial effusion     a. Mod by echo 2012 at time of PNA. b. Echo 07/2012: small pericardial effusion vs fat.  . Hypertension     dr Percival Spanish  . Obesity   . CAD (coronary artery disease)     a. Nonobstructive by cath 2006 - 25% LAD, 25-30% after 1st diag, distal 25% PDA, minor irreg LCx. b. Normal nuc 2011.  Marland Kitchen Hiatal hernia     a. 07/2013: HH with small stricture holding up barium tablet (concides with patient's sx of food sticking).  . 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  . Morbid obesity   . Abnormal Doppler ultrasound of carotid artery     a. 05/2013 - mild plaque, 1-39% BICA, abnormal appearrance of thyroid (TSH wnl in 06/2013).  . Palpitations     a. PVC/PAC on EKG 08/2013.  Marland Kitchen Sleep apnea     CPAP currently broke, calling company about machine, pt does not know cpap settings, uses cpap off and on  . Family history of anesthesia complication     daughter slow to awaken after teeth extraction at age 60  . Toe ulcer 01-08-14    Left big toe-being tx. at wound center "is healing"  . PONV (postoperative nausea and vomiting)     n/v, and room spinning after cataract and parotid surgery  . Difficult intubation 09-07-2012    big neck trouble with intubation parotid surgery"takes little med to sedate"      Past Surgical History  Procedure Laterality Date  . Carpal tunnel release  5 yrs ago    Left  . Cataract extraction Bilateral yrs ago  . Cardiac catheterization      6  . Parotidectomy  09/07/2012    Procedure: PAROTIDECTOMY;  Surgeon: Melida Quitter, MD;  Location: [facility];  Service: ENT;  Laterality: Left;  LEFT  PAROTIDECTOMY  . Breath tek h pylori N/A 07/29/2013    Procedure: BREATH TEK H PYLORI;  Surgeon: Shann Medal, MD;  Location: [facility];  Service: General;  Laterality: N/A;  . Tubal ligation  yrs ago  . Tonsillectomy  1968  . Cholecystectomy  1982  . Esophagogastroduodenoscopy N/A 12/27/2013    Procedure: ESOPHAGOGASTRODUODENOSCOPY (EGD);  Surgeon: Shann Medal,  MD;  Location: [facility];  Service: General;  Laterality: N/A;      Current Outpatient Prescriptions  Medication Sig Dispense Refill  . ACCU-CHEK AVIVA PLUS test strip       . B-D INS SYR ULTRAFINE 1CC/31G 31G X 5/16" 1 ML MISC       . Canagliflozin-Metformin HCl 410-560-0241 MG TABS Take 1 tablet by mouth every morning.  30 tablet  1  . esomeprazole (NEXIUM) 40 MG capsule Take 40 mg by mouth daily at 12 noon.      . fenofibrate (TRICOR) 48 MG tablet Take 48 mg by mouth daily.      . insulin aspart (NOVOLOG) 100 UNIT/ML injection Inject 52-59 Units into the skin 3 (three) times daily before meals.       . insulin glargine (LANTUS) 100 UNIT/ML injection Inject 100 Units into the skin 2 (two) times daily.      . metFORMIN (GLUCOPHAGE) 1000 MG tablet Take 1 tablet (1,000 mg total) by mouth every evening.  60 tablet  11  . metoprolol (LOPRESSOR) 50 MG tablet Take 75 mg by mouth 2 (two) times daily.      Marland Kitchen olmesartan-hydrochlorothiazide (BENICAR HCT) 40-25 MG per tablet Take 1 tablet by mouth every morning.      . Pitavastatin Calcium 4 MG TABS Take 1 tablet (4 mg total) by mouth daily.  30 tablet  11  . acetaminophen (TYLENOL) 500 MG tablet Take 500 mg by mouth every 6 (six) hours as needed for mild pain.      Marland Kitchen aspirin EC 325 MG tablet Take 325 mg by mouth daily.      . cholecalciferol (VITAMIN D) 1000 UNITS tablet Take 1,000 Units by mouth daily.      . Magnesium 250 MG TABS Take 250 mg by mouth daily.      . [DISCONTINUED] Olmesartan-Amlodipine-HCTZ (TRIBENZOR) 40-5-12.5 MG TABS Take 1 tablet by mouth daily.         No  current facility-administered medications for this visit.      Allergies  Allergen Reactions  . Ezetimibe-Simvastatin Other (See Comments)    Leg cramps  . Penicillins Other (See Comments)    fainted  . Celebrex [Celecoxib] Rash  . Levemir [Insulin Detemir] Rash    REVIEW OF SYSTEMS: Skin:  No history of rash.  No history of abnormal moles. Infection:  No history of hepatitis or HIV.  No history of MRSA. Neurologic:  No history of stroke.  No history of seizure.  No history of headaches. Cardiac:  She sees Dr. Percival Spanish from a cardiology standpoint.  She had a heart cath about 2010.  No history of seeing a cardiologist. Pulmonary:  OSA.   On CPAP since Dec 2013.  She did see Dr. Melvyn Novas, but he has released her.  Left parotid tumor excised by Dr. Redmond Baseman - Jan 2014.  Endocrine:  IDDM since 2008.  She first started her DM in 1988. No thyroid disease.  She says that her Magnesium and Vit D are low. Gastrointestinal:  Has GERD on Nexium.  .   She said that Dr. Fuller Plan "stretched her esophus about 7 years ago.  No history of liver disease.  Open cholecystectomy - 1982.  .  No history of pancreas disease.  No history of colon disease.  Had neg colonoscopy by Dr. Fuller Plan - 2009. Urologic:  No history of kidney stones.  No history of bladder infections. GYN:  Sees Dr. Harrington Challenger.  They have talked about a hysterectomy,  but not now. Musculoskeletal:  Chronic degenerative back disease.  She saw Dr. Linden Dolin, but not interested in seeing him any more.  She has a "hole" in her left big toe. Hematologic:  No bleeding disorder.  No history of anemia.  Not anticoagulated. Psycho-social:  The patient is oriented.   The patient has no obvious psychologic or social impairment to understanding our conversation and plan.  SOCIAL and FAMILY HISTORY: Married.  Her husband just retired from [employer] (12/2013).  He worked maintenance there. On disability for back issues and DM since 2012. She has 3 daughters - 76, 29, and  11.  Her 53 yo daughter is a Marine scientist at [employer].  Her other two daughters are respiratory therapists at [employer]. [name] is her daughter who is a resp therapist at [employer]. She has 3 grandchildren whom she wants to play with more.  PHYSICAL EXAM: BP 118/64  Pulse 72  Resp 16  Ht 5\' 8"  (1.727 m)  Wt 242 lb 3.2 oz (109.861 kg)  BMI 36.83 kg/m2  General: Obese WF who is alert and generally healthy appearing.  HEENT: Normal. Pupils equal. Neck: Supple. No mass.  No thyroid mass. Lymph Nodes:  No supraclavicular or cervical nodes. Lungs: Clear to auscultation and symmetric breath sounds. Heart:  RRR. No murmur or rub. Abdomen: Soft. No mass. No tenderness. No hernia. Normal bowel sounds.  All her weight is in her abdomen.  She is all apple.  She has a long right subcostal scar from the cholecystectomy.  Rectal: Not done. Extremities:  Good strength and ROM  in upper and lower extremities. Neurologic:  Grossly intact to motor and sensory function. Psychiatric: Has normal mood and affect. Behavior is normal.   DATA REVIEWED: Epic notes.  I gave her a copy of her UGI.    Alphonsa Overall, MD,  [facility], [address]  [place], [place]    [place] Phone:  [phone] FAX:  [phone]

## 2014-01-10 ENCOUNTER — Encounter: Payer: Self-pay | Admitting: Family Medicine

## 2014-01-10 ENCOUNTER — Ambulatory Visit (INDEPENDENT_AMBULATORY_CARE_PROVIDER_SITE_OTHER): Payer: PRIVATE HEALTH INSURANCE | Admitting: Family Medicine

## 2014-01-10 VITALS — BP 120/70 | HR 84 | Temp 96.8°F | Ht 68.0 in | Wt 242.0 lb

## 2014-01-10 DIAGNOSIS — L97509 Non-pressure chronic ulcer of other part of unspecified foot with unspecified severity: Secondary | ICD-10-CM | POA: Diagnosis not present

## 2014-01-10 DIAGNOSIS — I2510 Atherosclerotic heart disease of native coronary artery without angina pectoris: Secondary | ICD-10-CM | POA: Diagnosis not present

## 2014-01-10 DIAGNOSIS — E1169 Type 2 diabetes mellitus with other specified complication: Secondary | ICD-10-CM | POA: Diagnosis not present

## 2014-01-10 DIAGNOSIS — L989 Disorder of the skin and subcutaneous tissue, unspecified: Secondary | ICD-10-CM | POA: Diagnosis not present

## 2014-01-10 DIAGNOSIS — L97529 Non-pressure chronic ulcer of other part of left foot with unspecified severity: Secondary | ICD-10-CM

## 2014-01-10 MED ORDER — INSULIN ASPART 100 UNIT/ML ~~LOC~~ SOLN: 52.0000 [IU] | Freq: Three times a day (TID) | SUBCUTANEOUS | Status: DC

## 2014-01-10 MED ORDER — FENOFIBRATE 48 MG PO TABS: 48.0000 mg | ORAL_TABLET | Freq: Every day | ORAL | Status: DC

## 2014-01-10 MED ORDER — CANAGLIFLOZIN-METFORMIN HCL 150-1000 MG PO TABS: 1.0000 | ORAL_TABLET | ORAL | Status: DC

## 2014-01-10 MED ORDER — INSULIN GLARGINE 100 UNIT/ML ~~LOC~~ SOLN: 100.0000 [IU] | Freq: Two times a day (BID) | SUBCUTANEOUS | Status: DC

## 2014-01-10 MED ORDER — METOPROLOL TARTRATE 50 MG PO TABS: 75.0000 mg | ORAL_TABLET | Freq: Two times a day (BID) | ORAL | Status: DC

## 2014-01-10 NOTE — Patient Instructions (Addendum)
Continue current medications. Continue good therapeutic lifestyle changes which include good diet and exercise. Fall precautions discussed with patient. If an FOBT was given today- please return it to our front desk. If you are over 64 years old - you may need Prevnar 1 or the adult Pneumonia vaccine. Let us know if blood sugar readings remain low. We may want to reduce the metformin. Also we have to remember your creatinine is slightly elevated  Change positions slowly to avoid drop in blood pressure.  Followup visit with wound clinic We will schedule an evaluation by the dermatologist for the skin lesion on your left calf

## 2014-01-10 NOTE — Progress Notes (Signed)
Subjective:    Patient ID: [name], female    DOB: 1950/11/05, 63 y.o.   MRN: [id number]  HPI Patient comes in today to discuss hypotension and medication refills. She is scheduled for bariatric surgery on 01/20/14. She had recent lab work done for preoperative reasons 2 days ago. This was reviewed and her CBC was within normal limits the creatinine was slightly elevated. She is on metformin. She indicates that she's had some low blood sugars at home. If the sugars run low she should reduce the metformin. She was asked to call us if the sugars run low. The bariatric surgery may be postponed due to a change in insurance and she will let us know about that. The patient brought in a copy of her endoscopy report that was done by Dr. Lucia Gaskins. As we scan into the record.    Review of Systems  Constitutional: Negative.   HENT: Negative.   Eyes: Negative.   Respiratory: Negative.   Cardiovascular: Negative.   Gastrointestinal: Negative.   Endocrine: Negative.   Genitourinary: Negative.   Skin: Positive for wound (ulceration on L great toe).       Painful areas on legs  Allergic/Immunologic: Negative.   Neurological: Positive for dizziness and light-headedness.  Hematological: Negative.   Psychiatric/Behavioral: Negative.        Objective:   Physical Exam  Nursing note and vitals reviewed. Constitutional: She is oriented to person, place, and time. She appears well-developed and well-nourished. No distress.  Pleasant and alert and preparing for the bariatric surgery. She is also to see the wound clinic later today  HENT:  Head: Normocephalic and atraumatic.  Right Ear: External ear normal.  Left Ear: External ear normal.  Nose: Nose normal.  Mouth/Throat: Oropharynx is clear and moist.  Eyes: Conjunctivae and EOM are normal. Pupils are equal, round, and reactive to light. Right eye exhibits no discharge. Left eye exhibits no discharge. No scleral icterus.  Neck: Normal range of  motion. Neck supple. No thyromegaly present.  Cardiovascular: Normal rate, regular rhythm, normal heart sounds and intact distal pulses.  Exam reveals no gallop and no friction rub.   No murmur heard. At 72 per minute  Pulmonary/Chest: Effort normal and breath sounds normal. No respiratory distress. She has no wheezes. She has no rales. She exhibits no tenderness.  Abdominal: Soft. Bowel sounds are normal. She exhibits no mass. There is no tenderness. There is no rebound and no guarding.  Obesity  Musculoskeletal: Normal range of motion. She exhibits no edema and no tenderness.  Lymphadenopathy:    She has no cervical adenopathy.  Neurological: She is alert and oriented to person, place, and time. She has normal reflexes. No cranial nerve deficit.  Skin: Skin is warm and dry. No rash noted.  There is a suspicious skin lesion on her left calf and we will arrange for a dermatologist to see her to remove this.  Psychiatric: She has a normal mood and affect. Her behavior is normal. Judgment and thought content normal.  She is positive about her upcoming surgery to help her with her weight loss.   BP 90/50  Pulse 84  Temp(Src) 96.8 F (36 C) (Oral)  Ht 5\' 8"  (1.727 m)  Wt 242 lb (109.77 kg)  BMI 36.80 kg/m2        Assessment & Plan:  1. Morbid obesity -Bariatric surgery is planned  2. CAD  3. Skin lesion - Ambulatory referral to Dermatology  4. Ulcer of toe  of left foot -Followup with wound clinic -The lesion appears to be healing  Patient Instructions  Continue current medications. Continue good therapeutic lifestyle changes which include good diet and exercise. Fall precautions discussed with patient. If an FOBT was given today- please return it to our front desk. If you are over 35 years old - you may need Prevnar 81 or the adult Pneumonia vaccine. Let us know if blood sugar readings remain low. We may want to reduce the metformin. Also we have to remember your creatinine  is slightly elevated  Change positions slowly to avoid drop in blood pressure.  Followup visit with wound clinic We will schedule an evaluation by the dermatologist for the skin lesion on your left calf   Arrie Senate MD

## 2014-01-13 ENCOUNTER — Telehealth: Payer: Self-pay | Admitting: Family Medicine

## 2014-01-13 DIAGNOSIS — E669 Obesity, unspecified: Secondary | ICD-10-CM

## 2014-01-13 NOTE — Telephone Encounter (Signed)
The patient was called and instructed to reduce the Benicar 40/25-to 1/2 daily. She was also instructed to reduce her remaining blood sugar pill and invokamet to one half pill daily, she will call us with blood sugar blood pressure readings in a couple of days

## 2014-01-13 NOTE — Telephone Encounter (Signed)
surg is sch for 01/20/14  BS is running low - already off pm dose of metformin Am is taking invokamet 150/1000 one each am On insulin also  Ex of readings= 111, 79, 60 some were not fasting  ALSO BP running low- 105/56, 98/51, 125/70, 102/56, 88/49, 93/42, 89/47-- these numbers are on benicar  DWM - what do you suggest???

## 2014-01-16 ENCOUNTER — Telehealth: Payer: Self-pay | Admitting: Dietician

## 2014-01-16 ENCOUNTER — Telehealth: Payer: Self-pay | Admitting: Family Medicine

## 2014-01-16 NOTE — Telephone Encounter (Signed)
I would continue her current meds of taking medication

## 2014-01-16 NOTE — Telephone Encounter (Signed)
[name] left a voicemail for me at 11am on 01/16/2014 inquiring about gummy vitamins. I returned her call at 2pm on 01/16/2014; she told me she had spoken to Vilinda Flake, RN regarding the vitamins. Ms. Heit agreed to bring the vitamins to the hospital on her surgery date. She has been encouraged to chew the vitamins very well and purchase a chewable kind when she runs out of gummies.

## 2014-01-16 NOTE — Telephone Encounter (Signed)
FYI- to [facility]

## 2014-01-17 ENCOUNTER — Encounter (HOSPITAL_BASED_OUTPATIENT_CLINIC_OR_DEPARTMENT_OTHER): Payer: Commercial Managed Care - HMO | Attending: General Surgery

## 2014-01-17 DIAGNOSIS — E1169 Type 2 diabetes mellitus with other specified complication: Secondary | ICD-10-CM | POA: Insufficient documentation

## 2014-01-17 DIAGNOSIS — L97509 Non-pressure chronic ulcer of other part of unspecified foot with unspecified severity: Secondary | ICD-10-CM | POA: Diagnosis not present

## 2014-01-20 ENCOUNTER — Encounter (HOSPITAL_COMMUNITY): Payer: Self-pay | Admitting: *Deleted

## 2014-01-20 ENCOUNTER — Inpatient Hospital Stay (HOSPITAL_COMMUNITY): Payer: Medicare HMO | Admitting: Anesthesiology

## 2014-01-20 ENCOUNTER — Inpatient Hospital Stay (HOSPITAL_COMMUNITY)
Admission: RE | Admit: 2014-01-20 | Discharge: 2014-01-22 | DRG: 621 | Disposition: A | Discharge status: Home / Self Care | Payer: Medicare HMO | Source: Ambulatory Visit | Attending: Surgery | Admitting: Surgery

## 2014-01-20 ENCOUNTER — Encounter (HOSPITAL_COMMUNITY): Payer: Medicare HMO | Admitting: Anesthesiology

## 2014-01-20 ENCOUNTER — Encounter (HOSPITAL_COMMUNITY)
Admission: RE | Disposition: A | Discharge status: Home / Self Care | Payer: Self-pay | Source: Ambulatory Visit | Attending: Surgery

## 2014-01-20 DIAGNOSIS — E119 Type 2 diabetes mellitus without complications: Secondary | ICD-10-CM | POA: Diagnosis present

## 2014-01-20 DIAGNOSIS — E785 Hyperlipidemia, unspecified: Secondary | ICD-10-CM | POA: Diagnosis present

## 2014-01-20 DIAGNOSIS — I10 Essential (primary) hypertension: Secondary | ICD-10-CM | POA: Diagnosis present

## 2014-01-20 DIAGNOSIS — I359 Nonrheumatic aortic valve disorder, unspecified: Secondary | ICD-10-CM | POA: Diagnosis present

## 2014-01-20 DIAGNOSIS — Z6839 Body mass index (BMI) 39.0-39.9, adult: Secondary | ICD-10-CM

## 2014-01-20 DIAGNOSIS — G4733 Obstructive sleep apnea (adult) (pediatric): Secondary | ICD-10-CM | POA: Diagnosis present

## 2014-01-20 DIAGNOSIS — Z9089 Acquired absence of other organs: Secondary | ICD-10-CM

## 2014-01-20 DIAGNOSIS — Z9849 Cataract extraction status, unspecified eye: Secondary | ICD-10-CM

## 2014-01-20 DIAGNOSIS — I2510 Atherosclerotic heart disease of native coronary artery without angina pectoris: Secondary | ICD-10-CM | POA: Diagnosis present

## 2014-01-20 DIAGNOSIS — Z79899 Other long term (current) drug therapy: Secondary | ICD-10-CM

## 2014-01-20 DIAGNOSIS — K219 Gastro-esophageal reflux disease without esophagitis: Secondary | ICD-10-CM | POA: Diagnosis present

## 2014-01-20 DIAGNOSIS — K449 Diaphragmatic hernia without obstruction or gangrene: Secondary | ICD-10-CM | POA: Diagnosis present

## 2014-01-20 DIAGNOSIS — Z794 Long term (current) use of insulin: Secondary | ICD-10-CM

## 2014-01-20 HISTORY — PX: UPPER GI ENDOSCOPY: SHX6162

## 2014-01-20 HISTORY — PX: LAPAROSCOPIC GASTRIC SLEEVE RESECTION: SHX5895

## 2014-01-20 LAB — GLUCOSE, CAPILLARY
Glucose-Capillary: 100 mg/dL — ABNORMAL HIGH (ref 70–99)
Glucose-Capillary: 91 mg/dL (ref 70–99)
Glucose-Capillary: 83 mg/dL (ref 70–99)
Glucose-Capillary: 129 mg/dL — ABNORMAL HIGH (ref 70–99)
Glucose-Capillary: 183 mg/dL — ABNORMAL HIGH (ref 70–99)
Glucose-Capillary: 209 mg/dL — ABNORMAL HIGH (ref 70–99)

## 2014-01-20 LAB — HEMOGLOBIN AND HEMATOCRIT, BLOOD
HCT: 39.1 % (ref 36.0–46.0)
Hemoglobin: 12.6 g/dL (ref 12.0–15.0)

## 2014-01-20 SURGERY — GASTRECTOMY, SLEEVE, LAPAROSCOPIC
Anesthesia: General

## 2014-01-20 MED ORDER — MORPHINE SULFATE 2 MG/ML IJ SOLN
2.0000 mg | INTRAMUSCULAR | Status: DC | PRN
Start: 2014-01-20 — End: 2014-01-22
  Administered 2014-01-20: 2 mg via INTRAVENOUS
  Filled 2014-01-20: qty 1

## 2014-01-20 MED ORDER — HYDROMORPHONE HCL PF 1 MG/ML IJ SOLN: 0.2500 mg | INTRAMUSCULAR | Status: DC | PRN

## 2014-01-20 MED ORDER — PNEUMOCOCCAL VAC POLYVALENT 25 MCG/0.5ML IJ INJ
0.5000 mL | INJECTION | INTRAMUSCULAR | Status: AC
  Administered 2014-01-21: 0.5 mL via INTRAMUSCULAR
  Filled 2014-01-20 (×2): qty 0.5

## 2014-01-20 MED ORDER — HEPARIN SODIUM (PORCINE) 5000 UNIT/ML IJ SOLN
5000.0000 [IU] | Freq: Three times a day (TID) | INTRAMUSCULAR | Status: DC
  Administered 2014-01-20 – 2014-01-22 (×6): 5000 [IU] via SUBCUTANEOUS
  Filled 2014-01-20 (×8): qty 1

## 2014-01-20 MED ORDER — INSULIN ASPART 100 UNIT/ML ~~LOC~~ SOLN: 0.0000 [IU] | SUBCUTANEOUS | Status: DC

## 2014-01-20 MED ORDER — LACTATED RINGERS IR SOLN
Status: DC | PRN
  Administered 2014-01-20: 3000 mL

## 2014-01-20 MED ORDER — PROPOFOL 10 MG/ML IV BOLUS
INTRAVENOUS | Status: AC
  Filled 2014-01-20: qty 20

## 2014-01-20 MED ORDER — DEXTROSE 5 % IV SOLN
INTRAVENOUS | Status: AC
  Filled 2014-01-20: qty 2

## 2014-01-20 MED ORDER — UNJURY VANILLA POWDER: 2.0000 [oz_av] | Freq: Four times a day (QID) | ORAL | Status: DC

## 2014-01-20 MED ORDER — FENTANYL CITRATE 0.05 MG/ML IJ SOLN
INTRAMUSCULAR | Status: AC
  Filled 2014-01-20: qty 5

## 2014-01-20 MED ORDER — SUCCINYLCHOLINE CHLORIDE 20 MG/ML IJ SOLN
INTRAMUSCULAR | Status: DC | PRN
  Administered 2014-01-20: 100 mg via INTRAVENOUS

## 2014-01-20 MED ORDER — MIDAZOLAM HCL 5 MG/5ML IJ SOLN
INTRAMUSCULAR | Status: DC | PRN
  Administered 2014-01-20 (×2): 1 mg via INTRAVENOUS

## 2014-01-20 MED ORDER — TISSEEL VH 10 ML EX KIT
PACK | CUTANEOUS | Status: DC | PRN
  Administered 2014-01-20: 2

## 2014-01-20 MED ORDER — ACETAMINOPHEN 160 MG/5ML PO SOLN: 325.0000 mg | ORAL | Status: DC | PRN

## 2014-01-20 MED ORDER — UNJURY CHICKEN SOUP POWDER
2.0000 [oz_av] | Freq: Four times a day (QID) | ORAL | Status: DC
  Administered 2014-01-22: 2 [oz_av] via ORAL

## 2014-01-20 MED ORDER — PROPOFOL 10 MG/ML IV BOLUS
INTRAVENOUS | Status: DC | PRN
  Administered 2014-01-20: 150 mg via INTRAVENOUS

## 2014-01-20 MED ORDER — CHLORHEXIDINE GLUCONATE 4 % EX LIQD: 60.0000 mL | Freq: Once | CUTANEOUS | Status: DC

## 2014-01-20 MED ORDER — TISSEEL VH 10 ML EX KIT
PACK | CUTANEOUS | Status: AC
  Filled 2014-01-20: qty 2

## 2014-01-20 MED ORDER — DEXAMETHASONE SODIUM PHOSPHATE 10 MG/ML IJ SOLN
INTRAMUSCULAR | Status: AC
  Filled 2014-01-20: qty 1

## 2014-01-20 MED ORDER — DEXTROSE 5 % IV SOLN
2.0000 g | INTRAVENOUS | Status: AC
  Administered 2014-01-20: 2 g via INTRAVENOUS

## 2014-01-20 MED ORDER — EPHEDRINE SULFATE 50 MG/ML IJ SOLN
INTRAMUSCULAR | Status: DC | PRN
  Administered 2014-01-20 (×2): 5 mg via INTRAVENOUS

## 2014-01-20 MED ORDER — LIDOCAINE HCL (CARDIAC) 20 MG/ML IV SOLN
INTRAVENOUS | Status: DC | PRN
  Administered 2014-01-20: 50 mg via INTRAVENOUS

## 2014-01-20 MED ORDER — PROMETHAZINE HCL 25 MG/ML IJ SOLN
INTRAMUSCULAR | Status: AC
  Filled 2014-01-20: qty 1

## 2014-01-20 MED ORDER — ROCURONIUM BROMIDE 100 MG/10ML IV SOLN
INTRAVENOUS | Status: DC | PRN
  Administered 2014-01-20: 5 mg via INTRAVENOUS
  Administered 2014-01-20: 10 mg via INTRAVENOUS
  Administered 2014-01-20: 35 mg via INTRAVENOUS
  Administered 2014-01-20: 10 mg via INTRAVENOUS
  Administered 2014-01-20: 5 mg via INTRAVENOUS
  Administered 2014-01-20: 10 mg via INTRAVENOUS

## 2014-01-20 MED ORDER — ROCURONIUM BROMIDE 100 MG/10ML IV SOLN
INTRAVENOUS | Status: AC
  Filled 2014-01-20: qty 1

## 2014-01-20 MED ORDER — 0.9 % SODIUM CHLORIDE (POUR BTL) OPTIME
TOPICAL | Status: DC | PRN
  Administered 2014-01-20: 1000 mL

## 2014-01-20 MED ORDER — ONDANSETRON HCL 4 MG/2ML IJ SOLN
INTRAMUSCULAR | Status: AC
  Filled 2014-01-20: qty 2

## 2014-01-20 MED ORDER — FENTANYL CITRATE 0.05 MG/ML IJ SOLN
INTRAMUSCULAR | Status: AC
  Filled 2014-01-20: qty 2

## 2014-01-20 MED ORDER — PROMETHAZINE HCL 25 MG/ML IJ SOLN
6.2500 mg | INTRAMUSCULAR | Status: DC | PRN
  Administered 2014-01-20: 6.25 mg via INTRAVENOUS

## 2014-01-20 MED ORDER — LACTATED RINGERS IV SOLN: INTRAVENOUS | Status: DC

## 2014-01-20 MED ORDER — INSULIN ASPART 100 UNIT/ML ~~LOC~~ SOLN
0.0000 [IU] | SUBCUTANEOUS | Status: DC
  Administered 2014-01-20: 7 [IU] via SUBCUTANEOUS
  Administered 2014-01-21 (×2): 4 [IU] via SUBCUTANEOUS
  Administered 2014-01-21 (×2): 3 [IU] via SUBCUTANEOUS
  Administered 2014-01-21 (×2): 4 [IU] via SUBCUTANEOUS
  Administered 2014-01-22 (×2): 3 [IU] via SUBCUTANEOUS

## 2014-01-20 MED ORDER — POTASSIUM CHLORIDE IN NACL 20-0.45 MEQ/L-% IV SOLN
INTRAVENOUS | Status: DC
  Administered 2014-01-20 – 2014-01-22 (×5): via INTRAVENOUS
  Filled 2014-01-20 (×7): qty 1000

## 2014-01-20 MED ORDER — CHLORHEXIDINE GLUCONATE 0.12 % MT SOLN
15.0000 mL | Freq: Two times a day (BID) | OROMUCOSAL | Status: DC
  Administered 2014-01-20 – 2014-01-22 (×4): 15 mL via OROMUCOSAL
  Filled 2014-01-20 (×6): qty 15

## 2014-01-20 MED ORDER — HEPARIN SODIUM (PORCINE) 5000 UNIT/ML IJ SOLN
5000.0000 [IU] | INTRAMUSCULAR | Status: AC
  Administered 2014-01-20: 5000 [IU] via SUBCUTANEOUS
  Filled 2014-01-20: qty 1

## 2014-01-20 MED ORDER — LACTATED RINGERS IV SOLN
INTRAVENOUS | Status: DC
  Administered 2014-01-20: 1000 mL via INTRAVENOUS

## 2014-01-20 MED ORDER — ONDANSETRON HCL 4 MG/2ML IJ SOLN
INTRAMUSCULAR | Status: DC | PRN
  Administered 2014-01-20: 4 mg via INTRAVENOUS

## 2014-01-20 MED ORDER — DEXAMETHASONE SODIUM PHOSPHATE 10 MG/ML IJ SOLN
INTRAMUSCULAR | Status: DC | PRN
  Administered 2014-01-20: 10 mg via INTRAVENOUS

## 2014-01-20 MED ORDER — ACETAMINOPHEN 160 MG/5ML PO SOLN: 650.0000 mg | ORAL | Status: DC | PRN

## 2014-01-20 MED ORDER — OXYCODONE HCL 5 MG/5ML PO SOLN: 5.0000 mg | ORAL | Status: DC | PRN

## 2014-01-20 MED ORDER — FENTANYL CITRATE 0.05 MG/ML IJ SOLN
INTRAMUSCULAR | Status: DC | PRN
  Administered 2014-01-20: 50 ug via INTRAVENOUS
  Administered 2014-01-20: 100 ug via INTRAVENOUS
  Administered 2014-01-20 (×2): 50 ug via INTRAVENOUS
  Administered 2014-01-20: 100 ug via INTRAVENOUS

## 2014-01-20 MED ORDER — GLYCOPYRROLATE 0.2 MG/ML IJ SOLN
INTRAMUSCULAR | Status: DC | PRN
Start: 2014-01-20 — End: 2014-01-20
  Administered 2014-01-20: .8 mg via INTRAVENOUS

## 2014-01-20 MED ORDER — MIDAZOLAM HCL 2 MG/2ML IJ SOLN
INTRAMUSCULAR | Status: AC
  Filled 2014-01-20: qty 2

## 2014-01-20 MED ORDER — BIOTENE DRY MOUTH MT LIQD
15.0000 mL | Freq: Two times a day (BID) | OROMUCOSAL | Status: DC
  Administered 2014-01-21 – 2014-01-22 (×3): 15 mL via OROMUCOSAL

## 2014-01-20 MED ORDER — UNJURY CHOCOLATE CLASSIC POWDER
2.0000 [oz_av] | Freq: Four times a day (QID) | ORAL | Status: DC
  Administered 2014-01-22: 2 [oz_av] via ORAL

## 2014-01-20 MED ORDER — DEXTROSE 5 % IV SOLN
2.0000 g | Freq: Once | INTRAVENOUS | Status: DC
  Filled 2014-01-20 (×2): qty 2

## 2014-01-20 MED ORDER — BUPIVACAINE-EPINEPHRINE 0.25% -1:200000 IJ SOLN
INTRAMUSCULAR | Status: DC | PRN
  Administered 2014-01-20: 30 mL

## 2014-01-20 MED ORDER — LIDOCAINE HCL (CARDIAC) 20 MG/ML IV SOLN
INTRAVENOUS | Status: AC
  Filled 2014-01-20: qty 5

## 2014-01-20 MED ORDER — ONDANSETRON HCL 4 MG/2ML IJ SOLN
4.0000 mg | INTRAMUSCULAR | Status: DC | PRN
  Administered 2014-01-21: 4 mg via INTRAVENOUS
  Filled 2014-01-20: qty 2

## 2014-01-20 MED ORDER — GLYCOPYRROLATE 0.2 MG/ML IJ SOLN
INTRAMUSCULAR | Status: AC
  Filled 2014-01-20: qty 4

## 2014-01-20 MED ORDER — NEOSTIGMINE METHYLSULFATE 10 MG/10ML IV SOLN
INTRAVENOUS | Status: DC | PRN
  Administered 2014-01-20: 5 mg via INTRAVENOUS

## 2014-01-20 MED ORDER — BUPIVACAINE HCL (PF) 0.25 % IJ SOLN
INTRAMUSCULAR | Status: AC
  Filled 2014-01-20: qty 30

## 2014-01-20 SURGICAL SUPPLY — 66 items
ADH SKN CLS APL DERMABOND .7 (GAUZE/BANDAGES/DRESSINGS) ×2
APL SRG 32X5 SNPLK LF DISP (MISCELLANEOUS) ×2
APPLICATOR COTTON TIP 6IN STRL (MISCELLANEOUS) IMPLANT
APPLIER CLIP ROT 10 11.4 M/L (STAPLE)
APPLIER CLIP ROT 13.4 12 LRG (CLIP)
APR CLP LRG 13.4X12 ROT 20 MLT (CLIP)
APR CLP MED LRG 11.4X10 (STAPLE)
BAG SPEC RTRVL LRG 6X4 10 (ENDOMECHANICALS)
BLADE SURG SZ11 CARB STEEL (BLADE) ×3 IMPLANT
CABLE HIGH FREQUENCY MONO STRZ (ELECTRODE) IMPLANT
CANISTER SUCTION 2500CC (MISCELLANEOUS) ×3 IMPLANT
CHLORAPREP W/TINT 26ML (MISCELLANEOUS) ×4 IMPLANT
CLIP APPLIE ROT 10 11.4 M/L (STAPLE) IMPLANT
CLIP APPLIE ROT 13.4 12 LRG (CLIP) IMPLANT
DERMABOND ADVANCED (GAUZE/BANDAGES/DRESSINGS) ×1
DERMABOND ADVANCED .7 DNX12 (GAUZE/BANDAGES/DRESSINGS) ×2 IMPLANT
DEVICE SUTURE ENDOST 10MM (ENDOMECHANICALS) IMPLANT
DEVICE TROCAR PUNCTURE CLOSURE (ENDOMECHANICALS) ×1 IMPLANT
DRAPE CAMERA CLOSED 9X96 (DRAPES) ×3 IMPLANT
DRAPE UTILITY XL STRL (DRAPES) ×6 IMPLANT
ELECT REM PT RETURN 9FT ADLT (ELECTROSURGICAL) ×3
ELECTRODE REM PT RTRN 9FT ADLT (ELECTROSURGICAL) ×2 IMPLANT
FLOSHIELD STORZ OLYMP 5MM 30 (MISCELLANEOUS) ×1 IMPLANT
FLOSHIELD STORZ OLYMP 5MM 45 (MISCELLANEOUS) ×1 IMPLANT
GLOVE SURG SIGNA 7.5 PF LTX (GLOVE) ×3 IMPLANT
GOWN SPEC L4 XLG W/TWL (GOWN DISPOSABLE) ×3 IMPLANT
GOWN STRL REUS W/ TWL XL LVL3 (GOWN DISPOSABLE) ×6 IMPLANT
GOWN STRL REUS W/TWL XL LVL3 (GOWN DISPOSABLE) ×9
HOVERMATT SINGLE USE (MISCELLANEOUS) ×3 IMPLANT
KIT BASIN OR (CUSTOM PROCEDURE TRAY) ×3 IMPLANT
MANIFOLD NEPTUNE II (INSTRUMENTS) ×1 IMPLANT
NDL SPNL 22GX3.5 QUINCKE BK (NEEDLE) ×2 IMPLANT
NEEDLE SPNL 22GX3.5 QUINCKE BK (NEEDLE) ×3 IMPLANT
PACK UNIVERSAL I (CUSTOM PROCEDURE TRAY) ×3 IMPLANT
PENCIL BUTTON HOLSTER BLD 10FT (ELECTRODE) ×3 IMPLANT
POUCH SPECIMEN RETRIEVAL 10MM (ENDOMECHANICALS) IMPLANT
RELOAD BLUE (STAPLE) ×5 IMPLANT
RELOAD GOLD (STAPLE) ×6 IMPLANT
RELOAD GREEN (STAPLE) ×4 IMPLANT
SCISSORS LAP 5X35 DISP (ENDOMECHANICALS) ×1 IMPLANT
SEALANT SURGICAL APPL DUAL CAN (MISCELLANEOUS) ×3 IMPLANT
SET IRRIG TUBING LAPAROSCOPIC (IRRIGATION / IRRIGATOR) ×3 IMPLANT
SHEARS CURVED HARMONIC AC 45CM (MISCELLANEOUS) ×3 IMPLANT
SLEEVE ADV FIXATION 12X100MM (TROCAR) ×1 IMPLANT
SLEEVE ADV FIXATION 5X100MM (TROCAR) ×3 IMPLANT
SLEEVE GASTRECTOMY 36FR VISIGI (MISCELLANEOUS) ×3 IMPLANT
SOLUTION ANTI FOG 6CC (MISCELLANEOUS) ×3 IMPLANT
SPONGE GAUZE 4X4 12PLY (GAUZE/BANDAGES/DRESSINGS) IMPLANT
SPONGE LAP 18X18 X RAY DECT (DISPOSABLE) ×3 IMPLANT
STAPLE ECHEON FLEX 60 POW ENDO (STAPLE) ×3 IMPLANT
SUT ETHILON 2 0 PS N (SUTURE) ×1 IMPLANT
SUT MNCRL AB 4-0 PS2 18 (SUTURE) ×3 IMPLANT
SUT VICRYL 0 UR6 27IN ABS (SUTURE) ×1 IMPLANT
SYR 20CC LL (SYRINGE) ×3 IMPLANT
TOWEL OR NON WOVEN STRL DISP B (DISPOSABLE) ×3 IMPLANT
TRAY FOLEY CATH 14FRSI W/METER (CATHETERS) ×3 IMPLANT
TROCAR ADV FIXATION 12X100MM (TROCAR) ×1 IMPLANT
TROCAR ADV FIXATION 5X100MM (TROCAR) ×4 IMPLANT
TROCAR BLADELESS 15MM (ENDOMECHANICALS) ×3 IMPLANT
TROCAR XCEL 12X100 BLDLESS (ENDOMECHANICALS) ×1 IMPLANT
TROCAR XCEL NON BLADE 8MM B8LT (ENDOMECHANICALS) ×1 IMPLANT
TROCAR XCEL NON-BLD 5MMX100MML (ENDOMECHANICALS) ×4 IMPLANT
TUBING CONNECTING 10 (TUBING) ×3 IMPLANT
TUBING ENDO SMARTCAP (MISCELLANEOUS) ×1 IMPLANT
TUBING ENDO SMARTCAP PENTAX (MISCELLANEOUS) ×2 IMPLANT
TUBING FILTER THERMOFLATOR (ELECTROSURGICAL) ×3 IMPLANT

## 2014-01-20 NOTE — Op Note (Signed)
PATIENT:   [name] DOB:   Jan 09, 1951 MRN:   [id number]  DATE OF PROCEDURE: 01/20/2014                   FACILITY:  [facility]  OPERATIVE REPORT  PREOPERATIVE DIAGNOSIS:  Morbid obesity.  POSTOPERATIVE DIAGNOSIS:  Morbid obesity (weight 249, BMI of 39.2).  PROCEDURE:  Laparoscopic Sleeve gastrectomy (intraoperative upper endoscopy by Dr. Ok Anis)  SURGEON:  Fenton Malling. Lucia Gaskins, MD  FIRST ASSISTANTOk Anis, MD  ANESTHESIA:  General endotracheal.  Anesthesiologist: Peyton Najjar, MD CRNA: Bailey Mech, CRNA; Victoriano Lain, CRNA; Peggy Williford, Immunologist; Heide Scales, CRNA  General  ESTIMATED BLOOD LOSS:  Minimal.  LOCAL ANESTHESIA:  20 cc of 0000000 Marcaine  COMPLICATIONS:  None.  INDICATION FOR SURGERY:  [name] is a 63 y.o. white  female who sees Redge Gainer, MD as her primary care doctor.  She has completed our preoperative bariatric program and now comes for a laparoscopic Sleeve gastrectomy.  The indications, potential complications of surgery were explained to the patient.  Potential complications of the surgery include, but are not limited to, bleeding, infection, DVT, open surgery, and long-term nutritional consequences.  OPERATIVE NOTE:  The patient taken to room #1 at [facility] where [place]. Kellie Simmering underwent a general endotracheal anesthetic, supervised by Anesthesiologist: Peyton Najjar, MD CRNA: Bailey Mech, CRNA; Victoriano Lain, CRNA; Peggy Williford, Immunologist; Heide Scales, CRNA.  The patient was given 2 g of cefoxitin at the beginning of the procedure.  A time-out was held and surgical checklist run.  I accessed her abdominal cavity through the left upper quadrant with a 10-mm Optiview. I did an abdominal exploration.   She had a large omentum.  Even though her weight was relatively low for bariatric surgery, she maintains all her weight in her abdomen, giving her the anatomy of someone with a BMI 10 points higher.  The  Bowel, that I could see,  was  unremarkable. The left lobes of the liver unremarkable. Gallbladder was absent.   The right lobe of the liver was obscured by adhesions.  I spent 20 minutes taking down adhesions to expose the antrum, pylorus, and duodenal bulb.  Her stomach was tethered by these adhesions.  Dr. Redmond Pulling did a upper endoscopy to identify the pylorus.   I placed a total of 6 trocars. I placed a 5 mm left lateral trocar, a 12 mm left paramedian trocar, a 12 mm right paramedian torcar, a 5 mm right subcostal trocar that I converted to a 15 mm to extract the stomach and 5 mm subxiphoid trocar for the liver retractor.  Since there was a concern for a hiatal hernia, I passed the lap band balloon sizer and put in 15 cc of air.  This balloon held up at the hiatus, so I did not explore or try to fix the hiatus.  With her anatomy, it would not have been easy.  After identifying the pylorus endoscopically, I started out taking down the greater curvature attachment. I measured approximately 6 cm proximal from the pylorus and I started my dissection taking down the greater curvature of the stomach with the Harmonic Scalpel at that point. I took this dissection around her stomach to the angle of His and the left crus.   After I had mobilized the greater curvature of the stomach, I then passed the 36 French ViSiGi bougie which was used to suck up against the lesser curvature and  placed into the antrum. During the staple firing,  I tried to give the [place] a cuff at least about 1 cm. I tried to avoid narrowing the incisura. I used a total of 7 staple firings.  From antrum to the angle of His I used 2 green, 4 gold and 0 blue [place] 60 mm Ethicon staplers.  At each firing of the EndoGIA stapler, I inspected the stomach, anterior wall of the stomach, and underneath to make sure there was no compromise or impingement on to the ViSiGi bougie.   The staple line seemed linear without any corkscrewing of the stomach. Hemostasis was good. I did  not use any reinforcement. She had no areas of bleeding along the staple line.  Because I thought we had a good staple line, I then had the ViSiGi was converted to insufflate the pouch. A new stomach pouch was placed under water. There was no bubbling or leak noted.   At this point, Dr. Ok Anis broke scrub and passed an upper endoscope down through the esophagus into the stomach pouch. The stomach was tubular. There was no narrowing of the stomach pouch or angulation. We were easily able to pass the endoscope into the antrum and again put air pressure on the staple line. I inflated the upper abdomen with saline. There was no bubbling or evidence of air leak. The mucosa looked viable.  I converted to right subcostal trocar to a 15 trocar and extracted the stomach remnant through this intact and sent this to Pathology. I then placed 10 cc ofTisseel along the new greater curvature staple line, covered the entire staple line with the Tisseel, and aspirated out the saline that I had irrigated because I thought the staple line looked viable and complete. There was no evidence of leak. I did not leave a drain in place. Dr. Redmond Pulling decompressed the stomach with the endoscopy.   Then, I closed the trocar sites. I placed 2-0 Vicryl sutures at the 15-mm port site in the right upper quadrant. The other port sites seemed smaller not requiring sutures. I closed the skin at each site with a 5-0 Monocryl, painted each wound with Dermabond. The patient was transported to recovery room in good condition. Sponge and needle count were correct at the end of the case.   Alphonsa Overall, MD, [facility] Pager: [phone] Office phone:  [phone]

## 2014-01-20 NOTE — Anesthesia Preprocedure Evaluation (Addendum)
Anesthesia Evaluation  Patient identified by MRN, date of birth, ID band Patient awake    Reviewed: Allergy & Precautions, H&P , NPO status , Patient's Chart, lab work & pertinent test results, reviewed documented beta blocker date and time   History of Anesthesia Complications (+) PONV, DIFFICULT AIRWAY and AWARENESS UNDER ANESTHESIA  Airway Mallampati: III TM Distance: >3 FB Neck ROM: full  Mouth opening: Limited Mouth Opening  Dental no notable dental hx. (+) Teeth Intact, Dental Advisory Given   Pulmonary shortness of breath and with exertion, sleep apnea ,  breath sounds clear to auscultation  Pulmonary exam normal       Cardiovascular Exercise Tolerance: Poor hypertension, Pt. on home beta blockers and Pt. on medications + CAD negative cardio ROS  Rhythm:regular Rate:Normal  Non-obstructive CAD.  History pericardial effusion   Neuro/Psych Non-obstructive carotid stenosis negative neurological ROS  negative psych ROS   GI/Hepatic negative GI ROS, Neg liver ROS, hiatal hernia, GERD-  Medicated and Controlled,  Endo/Other  diabetes, Well Controlled, Type 2, Oral Hypoglycemic Agents, Insulin Dependent  Renal/GU negative Renal ROS  negative genitourinary   Musculoskeletal   Abdominal   Peds  Hematology negative hematology ROS (+)   Anesthesia Other Findings   Reproductive/Obstetrics negative OB ROS                       Anesthesia Physical Anesthesia Plan  ASA: III  Anesthesia Plan: General   Post-op Pain Management:    Induction: Intravenous  Airway Management Planned: Oral ETT and Video Laryngoscope Planned  Additional Equipment:   Intra-op Plan:   Post-operative Plan: Extubation in OR  Informed Consent: I have reviewed the patients History and Physical, chart, labs and discussed the procedure including the risks, benefits and alternatives for the proposed anesthesia with the  patient or authorized representative who has indicated his/her understanding and acceptance.   Dental Advisory Given  Plan Discussed with: CRNA and Surgeon  Anesthesia Plan Comments:         Anesthesia Quick Evaluation

## 2014-01-20 NOTE — Anesthesia Postprocedure Evaluation (Signed)
  Anesthesia Post-op Note  Patient: [name]  Procedure(s) Performed: Procedure(s) (LRB): LAPAROSCOPIC GASTRIC SLEEVE RESECTION (N/A) UPPER GI ENDOSCOPY  Patient Location: PACU  Anesthesia Type: General  Level of Consciousness: awake and alert   Airway and Oxygen Therapy: Patient Spontanous Breathing  Post-op Pain: mild  Post-op Assessment: Post-op Vital signs reviewed, Patient's Cardiovascular Status Stable, Respiratory Function Stable, Patent Airway and No signs of Nausea or vomiting  Last Vitals:  Filed Vitals:   01/20/14 1530  BP: 143/59  Pulse: 88  Temp:   Resp: 19    Post-op Vital Signs: stable   Complications: No apparent anesthesia complications

## 2014-01-20 NOTE — Progress Notes (Signed)
Utilization review completed.  

## 2014-01-20 NOTE — Op Note (Signed)
[name] [id number] 08/12/1951 01/20/2014  Preoperative diagnosis: morbid obesity  Postoperative diagnosis: Same   Procedure: upper endoscopy  Surgeon: Leighton Ruff. Chermaine Schnyder M.D., FACS   Anesthesia: Gen.   Indications for procedure: 63 year old WF undergoing Laparoscopic Gastric Sleeve Resection and an EGD was requested to evaluate the new gastric sleeve.   Description of procedure: because of her previous open cholecystectomy and due to the fact there was scar tissue and adhesions in RUQ, I scrubbed out and obtained the Olympus endoscope in order to confirm location of pylorus. I gently placed endoscope in the patient's oropharynx and gently glided it down the esophagus without any difficulty under direct visualization. Once I was in the stomach I advanced it down to antrum and then pylorus and duodenum. Dr Lucia Gaskins was then able to confirm the exact location of the pylorus externally. The stomach was desuffulated and withdrawn. I scrubbed back in to help him with the sleeve. Once the sleeve was done, I scrubbed out and placed the endoscope back into the oropharynx and gently advanced it down the esophagus.  Once I was in the gastric sleeve, I insufflated the stomach with air. I was able to cannulate and advanced the scope through the gastric sleeve. I was able to cannulate the duodenum with ease. Dr. Lucia Gaskins had placed saline in the upper abdomen. Upon further insufflation of the gastric sleeve there was no evidence of bubbles. Upon further inspection of the gastric sleeve, the mucosa appeared normal. There is no evidence of any mucosal abnormality except where the ViSiGi had been under suction. . There was no evidence of bleeding. The gastric sleeve was decompressed. The scope was withdrawn. The patient tolerated this portion of the procedure well. Please see Dr Pollie Friar operative note for details regarding the laparoscopic gastric sleeve resection.   Leighton Ruff. Redmond Pulling, MD, FACS  General, Bariatric, &  Minimally Invasive Surgery  [facility], [place]

## 2014-01-20 NOTE — H&P (View-Only) (Signed)
Re:   [name] DOB:   1951-02-22 MRN:   [id number]  ASSESSMENT AND PLAN: 1.  Morbid obesity  Initial weight - 249, BMI - 39.2  Per the [facility], the patient is a candidate for bariatric surgery.  The patient attended our initial information session and reviewed the types of bariatric surgery.    The patient is interested in the gastric sleeve.  I discussed with the patient the indications and risks of bariatric surgery.  The potential risks of surgery include, but are not limited to, bleeding, infection, leak from the bowel, DVT and PE, open surgery, long term nutrition consequences, and death.  The patient understands the importance of compliance and long term follow-up with our group after surgery.    She has completed her pre op workup.  There are insurance issues in that she is Medicare, but is switching to [employer].  We are to clear these up before surgery.  She is to see Dr. Laurance Flatten in the next week.  I have encouraged to make an appt to see him about 3 to 4 weeks post op.  2.  Insulin dependent DM 3.  Hyperlipidemia 4.  OSA  On CPAP since Dec 2013. 5.  CAD 6.  GERD 7.  Left parotid tumor - pleomorphic adenoma  Excised 09/07/2012 - Dr. Guido Sander 8.  Saw Dr. Darene Lamer. 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    No planned surgery. 9.  Saw Dr. Percival Spanish 10/30/2013 for aortic stenosis  He thought that this was moderate and needed no change in therapy. 10.  Boot on left foot - seen at wound clinic at [facility] - Dr. Inez Catalina  Chief Complaint  Patient presents with  . Bariatric Pre-op    lap sleeve   REFERRING PHYSICIAN: Redge Gainer, MD  HISTORY OF PRESENT ILLNESS: [name] is a 63 y.o. (DOB: 09/22/1950)  white  female whose primary care physician is Redge Gainer, MD and comes to me today for bariatric sugery. She comes by herself today.  Her upper endo did not show a distal esophageal stricture.  She is doing well.  She has changed  insurance companies (or products) and this could hold up her surgery. She is ready for surgery.  I think that she has a handle on the operation itself and the potential complications.  I have encourage to continue to lose weight, she has lost some. She is doing okay with the boot on her left foot.  UGI - 1. Status post cholecystectomy. 2. Hiatal hernia associated with small stricture which upholds the barium tablet, reproducing the patient's symptoms of food sticking. Psych - Seen by Dr. Candis Schatz.  Has snacking issues.  History of weight loss surgery: The patient is interested in a sleeve gastrectomy. She heard Dr. Lilyan Punt at our information session. She has been overweight since her teenage years. She says when she got married she weighed 149 pounds. After her three children, she has steadily gained weight. She has tried Weight Watchers, Atkins, and low calorie diets. Her best success with weight loss and a diet was with "don't between meals" diet. She tried a weight loss medicine through Dr. Cloyd Stagers - but it made her tachycardic and she stopped it.  She knows some friends who have had weight loss surgery, but she is unsure of what they have done.   I have encouraged her to go to the support group to see patients with different operations.  I  discussed that her GERD may limit a sleeve resection.  We will see what the UGI shows.  Though her weight is in the low range for bariatric surgery - all her weight is in her abdomen.  Any weight that she loses will be to her benefit.  She does admit to being a "nibbler" - but I told her that this could defeat any of the bariatric surgical options.  I also discussed that I have not been the primary surgeon on a sleeve gastrectomy, though I have assisted in more than 10 cases.  Our surgeon who has the most experience with gastric sleeves, Dr. Lilyan Punt, is leaving in December.  I offered that she could see one of my other partners.   Past Medical History  Diagnosis  Date  . 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 Hyperlipidemia   . Cystocele   . IBS (irritable bowel syndrome)   . Varicose veins   . Superficial phlebitis   . Esophageal stricture   . Fatty liver   . Osteopenia   . Vitamin D deficiency   . Dyslipidemia   . GERD (gastroesophageal reflux disease)   . Osteoarthritis   . Pericardial effusion     a. Mod by echo 2012 at time of PNA. b. Echo 07/2012: small pericardial effusion vs fat.  . Hypertension     dr Percival Spanish  . Obesity   . CAD (coronary artery disease)     a. Nonobstructive by cath 2006 - 25% LAD, 25-30% after 1st diag, distal 25% PDA, minor irreg LCx. b. Normal nuc 2011.  Marland Kitchen Hiatal hernia     a. 07/2013: HH with small stricture holding up barium tablet (concides with patient's sx of food sticking).  . 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  . Morbid obesity   . Abnormal Doppler ultrasound of carotid artery     a. 05/2013 - mild plaque, 1-39% BICA, abnormal appearrance of thyroid (TSH wnl in 06/2013).  . Palpitations     a. PVC/PAC on EKG 08/2013.  Marland Kitchen Sleep apnea     CPAP currently broke, calling company about machine, pt does not know cpap settings, uses cpap off and on  . Family history of anesthesia complication     daughter slow to awaken after teeth extraction at age 69  . Toe ulcer 01-08-14    Left big toe-being tx. at wound center "is healing"  . PONV (postoperative nausea and vomiting)     n/v, and room spinning after cataract and parotid surgery  . Difficult intubation 09-07-2012    big neck trouble with intubation parotid surgery"takes little med to sedate"      Past Surgical History  Procedure Laterality Date  . Carpal tunnel release  5 yrs ago    Left  . Cataract extraction Bilateral yrs ago  . Cardiac catheterization      06  . Parotidectomy  09/07/2012    Procedure: PAROTIDECTOMY;  Surgeon: Melida Quitter, MD;  Location: [facility];  Service: ENT;  Laterality: Left;  LEFT  PAROTIDECTOMY  . Breath tek h pylori N/A 07/29/2013    Procedure: BREATH TEK H PYLORI;  Surgeon: Shann Medal, MD;  Location: [facility];  Service: General;  Laterality: N/A;  . Tubal ligation  yrs ago  . Tonsillectomy  1968  . Cholecystectomy  1982  . Esophagogastroduodenoscopy N/A 12/27/2013    Procedure: ESOPHAGOGASTRODUODENOSCOPY (EGD);  Surgeon: Shann Medal,  MD;  Location: [facility];  Service: General;  Laterality: N/A;      Current Outpatient Prescriptions  Medication Sig Dispense Refill  . ACCU-CHEK AVIVA PLUS test strip       . B-D INS SYR ULTRAFINE 1CC/31G 31G X 5/16" 1 ML MISC       . Canagliflozin-Metformin HCl [phone] MG TABS Take 1 tablet by mouth every morning.  30 tablet  1  . esomeprazole (NEXIUM) 40 MG capsule Take 40 mg by mouth daily at 12 noon.      . fenofibrate (TRICOR) 48 MG tablet Take 48 mg by mouth daily.      . insulin aspart (NOVOLOG) 100 UNIT/ML injection Inject 52-59 Units into the skin 3 (three) times daily before meals.       . insulin glargine (LANTUS) 100 UNIT/ML injection Inject 100 Units into the skin 2 (two) times daily.      . metFORMIN (GLUCOPHAGE) 1000 MG tablet Take 1 tablet (1,000 mg total) by mouth every evening.  60 tablet  11  . metoprolol (LOPRESSOR) 50 MG tablet Take 75 mg by mouth 2 (two) times daily.      Marland Kitchen olmesartan-hydrochlorothiazide (BENICAR HCT) 40-25 MG per tablet Take 1 tablet by mouth every morning.      . Pitavastatin Calcium 4 MG TABS Take 1 tablet (4 mg total) by mouth daily.  30 tablet  11  . acetaminophen (TYLENOL) 500 MG tablet Take 500 mg by mouth every 6 (six) hours as needed for mild pain.      Marland Kitchen aspirin EC 325 MG tablet Take 325 mg by mouth daily.      . cholecalciferol (VITAMIN D) 1000 UNITS tablet Take 1,000 Units by mouth daily.      . Magnesium 250 MG TABS Take 250 mg by mouth daily.      . [DISCONTINUED] Olmesartan-Amlodipine-HCTZ (TRIBENZOR) 40-5-12.5 MG TABS Take 1 tablet by mouth daily.         No  current facility-administered medications for this visit.      Allergies  Allergen Reactions  . Ezetimibe-Simvastatin Other (See Comments)    Leg cramps  . Penicillins Other (See Comments)    fainted  . Celebrex [Celecoxib] Rash  . Levemir [Insulin Detemir] Rash    REVIEW OF SYSTEMS: Skin:  No history of rash.  No history of abnormal moles. Infection:  No history of hepatitis or HIV.  No history of MRSA. Neurologic:  No history of stroke.  No history of seizure.  No history of headaches. Cardiac:  She sees Dr. Percival Spanish from a cardiology standpoint.  She had a heart cath about 2010.  No history of seeing a cardiologist. Pulmonary:  OSA.   On CPAP since Dec 2013.  She did see Dr. Melvyn Novas, but he has released her.  Left parotid tumor excised by Dr. Redmond Baseman - Jan 2014.  Endocrine:  IDDM since 2008.  She first started her DM in 1988. No thyroid disease.  She says that her Magnesium and Vit D are low. Gastrointestinal:  Has GERD on Nexium.  .   She said that Dr. Fuller Plan "stretched her esophus about 7 years ago.  No history of liver disease.  Open cholecystectomy - 1982.  .  No history of pancreas disease.  No history of colon disease.  Had neg colonoscopy by Dr. Fuller Plan - 2009. Urologic:  No history of kidney stones.  No history of bladder infections. GYN:  Sees Dr. Harrington Challenger.  They have talked about a hysterectomy,  but not now. Musculoskeletal:  Chronic degenerative back disease.  She saw Dr. Linden Dolin, but not interested in seeing him any more.  She has a "hole" in her left big toe. Hematologic:  No bleeding disorder.  No history of anemia.  Not anticoagulated. Psycho-social:  The patient is oriented.   The patient has no obvious psychologic or social impairment to understanding our conversation and plan.  SOCIAL and FAMILY HISTORY: Married.  Her husband just retired from [employer] (12/2013).  He worked maintenance there. On disability for back issues and DM since 2012. She has 3 daughters - 42, 45, and  55.  Her 55 yo daughter is a Marine scientist at [employer].  Her other two daughters are respiratory therapists at [employer]. [name] is her daughter who is a resp therapist at [employer]. She has 3 grandchildren whom she wants to play with more.  PHYSICAL EXAM: BP 118/64  Pulse 72  Resp 16  Ht 5\' 8"  (1.727 m)  Wt 242 lb 3.2 oz (109.861 kg)  BMI 36.83 kg/m2  General: Obese WF who is alert and generally healthy appearing.  HEENT: Normal. Pupils equal. Neck: Supple. No mass.  No thyroid mass. Lymph Nodes:  No supraclavicular or cervical nodes. Lungs: Clear to auscultation and symmetric breath sounds. Heart:  RRR. No murmur or rub. Abdomen: Soft. No mass. No tenderness. No hernia. Normal bowel sounds.  All her weight is in her abdomen.  She is all apple.  She has a long right subcostal scar from the cholecystectomy.  Rectal: Not done. Extremities:  Good strength and ROM  in upper and lower extremities. Neurologic:  Grossly intact to motor and sensory function. Psychiatric: Has normal mood and affect. Behavior is normal.   DATA REVIEWED: Epic notes.  I gave her a copy of her UGI.    Alphonsa Overall, MD,  [facility], [address]  [place], [place]    [place] Phone:  [phone] FAX:  [phone]

## 2014-01-20 NOTE — Progress Notes (Signed)
Patient had skin graft applied Lt great toe 01/17/14. She states she sucked on life saver this am because her blood sugar was 58 . CBG 100 on arrival to short stay

## 2014-01-20 NOTE — Interval H&P Note (Signed)
History and Physical Interval Note:  01/20/2014 11:02 AM  [name]  has presented today for surgery, with the diagnosis of morbid obesity   The various methods of treatment have been discussed with the patient and family.  Her husband is here with her today.  After consideration of risks, benefits and other options for treatment, the patient has consented to  Procedure(s): LAPAROSCOPIC GASTRIC SLEEVE RESECTION (N/A) as a surgical intervention .  The patient's history has been reviewed, patient examined, no change in status, stable for surgery.  I have reviewed the patient's chart and labs.  Questions were answered to the patient's satisfaction.     Shann Medal

## 2014-01-20 NOTE — Progress Notes (Signed)
Patient states she fell in the bathroom this morning. She says her back is stiff but otherwise she is OK. No broken areas on skin.

## 2014-01-20 NOTE — Transfer of Care (Signed)
Immediate Anesthesia Transfer of Care Note  Patient: [name]  Procedure(s) Performed: Procedure(s): LAPAROSCOPIC GASTRIC SLEEVE RESECTION (N/A) UPPER GI ENDOSCOPY  Patient Location: PACU  Anesthesia Type:General  Level of Consciousness: awake, alert  and oriented  Airway & Oxygen Therapy: Patient Spontanous Breathing and Patient connected to face mask oxygen  Post-op Assessment: Report given to PACU RN and Post -op Vital signs reviewed and stable  Post vital signs: Reviewed and stable  Complications: No apparent anesthesia complications

## 2014-01-21 ENCOUNTER — Encounter (HOSPITAL_COMMUNITY): Payer: Self-pay | Admitting: Surgery

## 2014-01-21 ENCOUNTER — Inpatient Hospital Stay (HOSPITAL_COMMUNITY): Payer: Medicare HMO

## 2014-01-21 DIAGNOSIS — Z09 Encounter for follow-up examination after completed treatment for conditions other than malignant neoplasm: Secondary | ICD-10-CM

## 2014-01-21 LAB — CBC WITH DIFFERENTIAL/PLATELET
Basophils Absolute: 0 10*3/uL (ref 0.0–0.1)
Basophils Relative: 0 % (ref 0–1)
Eosinophils Absolute: 0 10*3/uL (ref 0.0–0.7)
Eosinophils Relative: 0 % (ref 0–5)
HCT: 36.2 % (ref 36.0–46.0)
Hemoglobin: 11.9 g/dL — ABNORMAL LOW (ref 12.0–15.0)
Lymphocytes Relative: 15 % (ref 12–46)
Lymphs Abs: 1.2 10*3/uL (ref 0.7–4.0)
MCH: 27.2 pg (ref 26.0–34.0)
MCHC: 32.9 g/dL (ref 30.0–36.0)
MCV: 82.8 fL (ref 78.0–100.0)
Monocytes Absolute: 0.5 10*3/uL (ref 0.1–1.0)
Monocytes Relative: 6 % (ref 3–12)
Neutro Abs: 6.2 10*3/uL (ref 1.7–7.7)
Neutrophils Relative %: 79 % — ABNORMAL HIGH (ref 43–77)
Platelets: 208 10*3/uL (ref 150–400)
RBC: 4.37 MIL/uL (ref 3.87–5.11)
RDW: 14.4 % (ref 11.5–15.5)
WBC: 8 10*3/uL (ref 4.0–10.5)

## 2014-01-21 LAB — GLUCOSE, CAPILLARY
Glucose-Capillary: 124 mg/dL — ABNORMAL HIGH (ref 70–99)
Glucose-Capillary: 141 mg/dL — ABNORMAL HIGH (ref 70–99)
Glucose-Capillary: 152 mg/dL — ABNORMAL HIGH (ref 70–99)
Glucose-Capillary: 164 mg/dL — ABNORMAL HIGH (ref 70–99)
Glucose-Capillary: 169 mg/dL — ABNORMAL HIGH (ref 70–99)
Glucose-Capillary: 197 mg/dL — ABNORMAL HIGH (ref 70–99)

## 2014-01-21 LAB — HEMOGLOBIN AND HEMATOCRIT, BLOOD
HCT: 37.4 % (ref 36.0–46.0)
Hemoglobin: 12 g/dL (ref 12.0–15.0)

## 2014-01-21 MED ORDER — IOHEXOL 300 MG/ML  SOLN
50.0000 mL | Freq: Once | INTRAMUSCULAR | Status: AC | PRN
  Administered 2014-01-21: 50 mL via ORAL

## 2014-01-21 NOTE — Progress Notes (Signed)
VASCULAR LAB PRELIMINARY  PRELIMINARY  PRELIMINARY  PRELIMINARY  Bilateral lower extremity venous duplex  completed.    Preliminary report:  Bilateral:  No evidence of DVT, superficial thrombosis, or Baker's Cyst.    Nani Ravens, RVT 01/21/2014, 9:47 AM

## 2014-01-21 NOTE — Progress Notes (Signed)
General Surgery Note  LOS: 1 day  POD -  1 Day Post-Op  Assessment/Plan: 1.  Gastric SLEEVE RESECTION, UPPER GI ENDOSCOPY - 01/20/2014 - D. Eddith Mentor  Did well last PM, though sounds like sat machine bothered her.  For swallow today.  2. Insulin dependent DM   Glucose - 169 - 01/21/2014  3. Hyperlipidemia  4. OSA   On CPAP since Dec 2013.  5. CAD  6. 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 Guido Sander  8. Saw Dr. Darene Lamer. 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   No planned surgery.  9. Saw Dr. Percival Spanish 10/30/2013 for aortic stenosis   He thought that this was moderate and needed no change in therapy.  10. Left foot wound - seen at wound clinic at [facility] - Dr. Inez Catalina 11.  DVT prophylaxis - SQ Heparin   Active Problems:   Morbid obesity  Subjective:  Doing okay.  Husband in room.  Sounds like sat machine bothered her last PM.  Objective:   Filed Vitals:   01/21/14 0524  BP: 124/52  Pulse: 108  Temp: 97.8 F (36.6 C)  Resp: 18     Intake/Output from previous day:  06/08 0701 - 06/09 0700 In: 3702.1 [I.V.:3702.1] Out: 1450 [Urine:1400; Blood:50]  Intake/Output this shift:      Physical Exam:   General: Obese WF who is alert and oriented.    HEENT: Normal. Pupils equal. .   Lungs: Clear.  IS - 1,400 cc.   Abdomen: Soft.  Rare BS.   Wound: Clean     Lab Results:    Recent Labs  01/20/14 1844 01/21/14 0532  WBC  --  8.0  HGB 12.6 11.9*  HCT 39.1 36.2  PLT  --  208    BMET  No results found for this basename: NA, K, CL, CO2, GLUCOSE, BUN, CREATININE, CALCIUM,  in the last 72 hours  PT/INR  No results found for this basename: LABPROT, INR,  in the last 72 hours  ABG  No results found for this basename: PHART, PCO2, PO2, HCO3,  in the last 72 hours   Studies/Results:  No results found.   Anti-infectives:   Anti-infectives   Start     Dose/Rate Route Frequency Ordered Stop   01/20/14 1400  cefOXitin (MEFOXIN)  2 g in dextrose 5 % 50 mL IVPB  Status:  Discontinued     2 g 100 mL/hr over 30 Minutes Intravenous  Once 01/20/14 1348 01/20/14 1745   01/20/14 0845  cefOXitin (MEFOXIN) 2 g in dextrose 5 % 50 mL IVPB    Comments:  PCN allergy noted, Patient tolerated ancef in the past   2 g 100 mL/hr over 30 Minutes Intravenous On call to O.R. 01/20/14 0840 01/20/14 1148      Alphonsa Overall, MD, FACS Pager: [facility] Office: [phone] 01/21/2014

## 2014-01-21 NOTE — Progress Notes (Signed)
Placed pt on cpap for rest, autotitration mode 5-20cm h2o with 2l o2 bleedin.  RN notified.  Pt is using her mask and tubing from home and tolerating well at this time.  Sterile water added to humidity chamber.  HR100, sats96%.

## 2014-01-21 NOTE — Care Management Note (Signed)
    Page 1 of 1   01/21/2014     10:52:16 AM CARE MANAGEMENT NOTE 01/21/2014  Patient:  IMO, ALKHATIB   Account Number:  [id number]  Date Initiated:  01/21/2014  Documentation initiated by:  Sunday Spillers  Subjective/Objective Assessment:   63 yo female admitted s/p sleeve gastrectomy. PTA lived at home with spouse.     Action/Plan:   Home when stable   Anticipated DC Date:  01/23/2014   Anticipated DC Plan:  [facility]  CM consult      Choice offered to / List presented to:             Status of service:  Completed, signed off Medicare Important Message given?  NA - LOS <3 / Initial given by admissions (If response is "NO", the following Medicare IM given date fields will be blank) Date Medicare IM given:   Date Additional Medicare IM given:    Discharge Disposition:  HOME/SELF CARE  Per UR Regulation:  Reviewed for med. necessity/level of care/duration of stay  If discussed at [place] of Stay Meetings, dates discussed:    Comments:

## 2014-01-21 NOTE — Progress Notes (Signed)
Nutrition Education Note  Received consult for diet education per DROP protocol.   Discussed 2 week post op diet with pt. Emphasized that liquids must be non carbonated, non caffeinated, and sugar free. Fluid goals discussed. Pt to follow up with outpatient bariatric RD for further diet progression after 2 weeks. Multivitamins and minerals also reviewed. Teach back method used, pt expressed understanding, expect good compliance.   Diet: First 2 Weeks  You will see the nutritionist about two (2) weeks after your surgery. The nutritionist will increase the types of foods you can eat if you are handling liquids well:  If you have severe vomiting or nausea and cannot handle clear liquids lasting longer than 1 day, call your surgeon  Protein Shake  Drink at least 2 ounces of shake 5-6 times per day  Each serving of protein shakes (usually 8 - 12 ounces) should have a minimum of:  15 grams of protein  And no more than 5 grams of carbohydrate  Goal for protein each day:  Men = 80 grams per day  Women = 60 grams per day  Protein powder may be added to fluids such as non-fat milk or Lactaid milk or Soy milk (limit to 35 grams added protein powder per serving)   Hydration  Slowly increase the amount of water and other clear liquids as tolerated (See Acceptable Fluids)  Slowly increase the amount of protein shake as tolerated  Sip fluids slowly and throughout the day  May use sugar substitutes in small amounts (no more than 6 - 8 packets per day; i.e. Splenda)   Fluid Goal  The first goal is to drink at least 8 ounces of protein shake/drink per day (or as directed by the nutritionist); some examples of protein shakes are [employer], [employer], EAS Edge HP, and Unjury. See handout from pre-op Bariatric Education Class:  Slowly increase the amount of protein shake you drink as tolerated  You may find it easier to slowly sip shakes throughout the day  It is important to get your proteins  in first  Your fluid goal is to drink 64 - 100 ounces of fluid daily  It may take a few weeks to build up to this  32 oz (or more) should be clear liquids  And  32 oz (or more) should be full liquids (see below for examples)  Liquids should not contain sugar, caffeine, or carbonation   Clear Liquids:  Water or Sugar-free flavored water (i.e. Fruit H2O, Propel)  Decaffeinated coffee or tea (sugar-free)  Crystal Lite, Wyler's Lite, Minute Maid Lite  Sugar-free Jell-O  Bouillon or broth  Sugar-free Popsicle: *Less than 20 calories each; Limit 1 per day   Full Liquids:  Protein Shakes/Drinks + 2 choices per day of other full liquids  Full liquids must be:  No More Than 12 grams of Carbs per serving  No More Than 3 grams of Fat per serving  Strained low-fat cream soup  Non-Fat milk  Fat-free Lactaid Milk  Sugar-free yogurt (Dannon Lite & Fit, [place] yogurt)     [employer] [place], RD, [place] [phone] Pager [phone] Weekend/After Hours Pager

## 2014-01-21 NOTE — Progress Notes (Signed)
RT went to place patient on CPAP. Patient could not tolerate mask. Patient is wearing the end tidal CO2 cannula. RT to monitor as needed.

## 2014-01-22 LAB — CBC WITH DIFFERENTIAL/PLATELET
Basophils Absolute: 0 10*3/uL (ref 0.0–0.1)
Basophils Relative: 0 % (ref 0–1)
Eosinophils Absolute: 0.1 10*3/uL (ref 0.0–0.7)
Eosinophils Relative: 1 % (ref 0–5)
HCT: 36.9 % (ref 36.0–46.0)
Hemoglobin: 11.9 g/dL — ABNORMAL LOW (ref 12.0–15.0)
Lymphocytes Relative: 23 % (ref 12–46)
Lymphs Abs: 2.5 10*3/uL (ref 0.7–4.0)
MCH: 27.4 pg (ref 26.0–34.0)
MCHC: 32.2 g/dL (ref 30.0–36.0)
MCV: 84.8 fL (ref 78.0–100.0)
Monocytes Absolute: 0.7 10*3/uL (ref 0.1–1.0)
Monocytes Relative: 6 % (ref 3–12)
Neutro Abs: 7.4 10*3/uL (ref 1.7–7.7)
Neutrophils Relative %: 70 % (ref 43–77)
Platelets: 189 10*3/uL (ref 150–400)
RBC: 4.35 MIL/uL (ref 3.87–5.11)
RDW: 14.5 % (ref 11.5–15.5)
WBC: 10.8 10*3/uL — ABNORMAL HIGH (ref 4.0–10.5)

## 2014-01-22 LAB — GLUCOSE, CAPILLARY
Glucose-Capillary: 108 mg/dL — ABNORMAL HIGH (ref 70–99)
Glucose-Capillary: 121 mg/dL — ABNORMAL HIGH (ref 70–99)
Glucose-Capillary: 155 mg/dL — ABNORMAL HIGH (ref 70–99)
Glucose-Capillary: 99 mg/dL (ref 70–99)

## 2014-01-22 NOTE — Progress Notes (Addendum)
Patient alert and oriented, pain is controlled. Patient is tolerating fluids, advanced to protein shake today. Reviewed Gastric Sleeve discharge instructions with patient and patient is able to articulate understanding. Provided information on BELT program, Support Group and [facility] outpatient pharmacy. All questions answered, will continue to monitor.

## 2014-01-22 NOTE — Discharge Instructions (Signed)
[facility] [facility] - DISCHARGE INSTRUCTIONS TO PATIENT  Activity:  Driving - May drive in 3 or 4 days   Lifting - Take it easy for 1 week, then no limit  Wound Care:   May start showers tonight  Diet:  Post sleeve gastrectomy diet  Follow up appointment:  Call Dr. Pollie Friar office [facility]) at [phone] for an appointment in 2 weeks        Make an appt with Dr. Laurance Flatten in 1 to 2 weeks.  Medications and dosages:  Resume your home medications.  You have a prescription for:  Oxycodone elixir             Check you blood sugars regularly and adjust your diabetes medication appropriately.  Call Dr. Lucia Gaskins or his office  [phone]) if you have:  Temperature greater than 100.4,  Persistent nausea and vomiting,  Severe uncontrolled pain,  Redness, tenderness, or signs of infection (pain, swelling, redness, odor or green/yellow discharge around the site),  Difficulty breathing, headache or visual disturbances,  Any other questions or concerns you may have after discharge.  In an emergency, call 911 or go to an Emergency Department at a nearby hospital.       GASTRIC BYPASS/SLEEVE  Home Care Instructions   These instructions are to help you care for yourself when you go home.  Call: If you have any problems.   Call [phone] and ask for the surgeon on call   If you need immediate assistance come to the ER at [facility]. Tell the ER staff you are a new post-op gastric bypass or gastric sleeve patient  Signs and symptoms to report:   Severe  vomiting or nausea o If you cannot handle clear liquids for longer than 1 day, call your surgeon   Abdominal pain which does not get better after taking your pain medication   Fever greater than 100.4  F and chills   Heart rate over 100 beats a minute   Trouble breathing   Chest pain   Redness,  swelling, drainage, or foul odor at incision (surgical) sites   If your incisions open or pull apart   Swelling or pain  in calf (lower leg)   Diarrhea (Loose bowel movements that happen often), frequent watery, uncontrolled bowel movements   Constipation, (no bowel movements for 3 days) if this happens: o Take Milk of Magnesia, 2 tablespoons by mouth, 3 times a day for 2 days if needed o Stop taking Milk of Magnesia once you have had a bowel movement o Call your doctor if constipation continues Or o Take Miralax  (instead of Milk of Magnesia) following the label instructions o Stop taking Miralax once you have had a bowel movement o Call your doctor if constipation continues   Anything you think is abnormal for you   Normal side effects after surgery:   Unable to sleep at night or unable to concentrate   Irritability   Being tearful (crying) or depressed  These are common complaints, possibly related to your anesthesia, stress of surgery, and change in lifestyle, that usually go away a few weeks after surgery. If these feelings continue, call your medical doctor.  Wound Care: You may have surgical glue, steri-strips, or staples over your incisions after surgery   Surgical glue: Looks like clear film over your incisions and will wear off a little at a time   Steri-strips: Adhesive strips of tape over your incisions. You may notice a yellowish color on  skin under the steri-strips. This is used to make the steri-strips stick better. Do not pull the steri-strips off - let them fall off   Staples: Staples may be removed before you leave the hospital o If you go home with staples, call [facility] for an appointment with your surgeons nurse to have staples removed 10 days after surgery, [phone]   Showering: You may shower two (2) days after your surgery unless your surgeon tells you differently o Wash gently around incisions with warm soapy water, rinse well, and gently pat dry o If you have a drain (tube from your incision), you may need someone to hold this while you shower o No tub baths  until staples are removed and incisions are healed   Medications:   Medications should be liquid or crushed if larger than the size of a dime   Extended release pills (medication that releases a little bit at a time through the  day) should not be crushed   Depending on the size and number of medications you take, you may need to space (take a few throughout the day)/change the time you take your medications so that you do not over-fill your pouch (smaller stomach)   Make sure you follow-up with you primary care physician to make medication changes needed during rapid weight loss and life -style changes   If you have diabetes, follow up with your doctor that orders your diabetes medication(s) within one week after surgery and check your blood sugar regularly    Do not drive while taking narcotics (pain medications)    Do not take acetaminophen (Tylenol) and Roxicet or Lortab Elixir at the same time since these pain medications contain acetaminophen   Diet:  First 2 Weeks You will see the nutritionist about two (2) weeks after your surgery. The nutritionist will increase the types of foods you can eat if you are handling liquids well:   If you have severe vomiting or nausea and cannot handle clear liquids lasting longer than 1 day call your surgeon Protein Shake   Drink at least 2 ounces of shake 5-6 times per day   Each serving of protein shakes (usually 8-12 ounces) should have a minimum of: o 15 grams of protein o And no more than 5 grams of carbohydrate   Goal for protein each day: o Men = 80 grams per day o Women = 60 grams per day      Protein powder may be added to fluids such as non-fat milk or Lactaid milk or Soy milk (limit to 35 grams added protein powder per serving)  Hydration   Slowly increase the amount of water and other clear liquids as tolerated (See Acceptable Fluids)   Slowly increase the amount of protein shake as tolerated   Sip fluids slowly and throughout the day    May use sugar substitutes in small amounts (no more than 6-8 packets per day; i.e. Splenda)  Fluid Goal   The first goal is to drink at least 8 ounces of protein shake/drink per day (or as directed by the nutritionist); some examples of protein shakes are [employer], [employer], EAS Edge HP, and Unjury. - See handout from pre-op Bariatric Education Class: o Slowly increase the amount of protein shake you drink as tolerated o You may find it easier to slowly sip shakes throughout the day o It is important to get your proteins in first   Your fluid goal is to drink 64-100  ounces of fluid daily o It may take a few weeks to build up to this    32 oz. (or more) should be clear liquids And   32 oz. (or more) should be full liquids (see below for examples)   Liquids should not contain sugar, caffeine, or carbonation  Clear Liquids:   Water of Sugar-free flavored water (i.e. Fruit HO, Propel)   Decaffeinated coffee or tea (sugar-free)   Crystal lite, Wylers Lite, Minute Maid Lite   Sugar-free Jell-O   Bouillon or broth   Sugar-free Popsicle:    - Less than 20 calories each; Limit 1 per day  Full Liquids:                   Protein Shakes/Drinks + 2 choices per day of other full liquids   Full liquids must be: o No More Than 12 grams of Carbs per serving o No More Than 3 grams of Fat per serving   Strained low-fat cream soup   Non-Fat milk   Fat-free Lactaid Milk   Sugar-free yogurt (Dannon Lite & Fit, Greek yogurt)    Vitamins and Minerals   Start 1 day after surgery unless otherwise directed by your surgeon   2 Chewable Multivitamin / Multimineral Supplement with iron (i.e. Centrum for Adults)   Vitamin B-12, 350-500 micrograms sub-lingual (place tablet under the tongue) each day   Chewable Calcium Citrate with Vitamin D-3 (Example: 3 Chewable Calcium  Plus 600 with Vitamin D-3) o Take 500 mg three (3) times a day for a total of 1500 mg each day o Do not take all 3 doses  of calcium at one time as it may cause constipation, and you can only absorb 500 mg at a time o Do not mix multivitamins containing iron with calcium supplements;  take 2 hours apart o Do not substitute Tums (calcium carbonate) for your calcium   Menstruating women and those at risk for anemia ( a blood disease that causes weakness) may need extra iron o Talk to your doctor to see if you need more iron   If you need extra iron: Total daily Iron recommendation (including Vitamins) is 50 to 100 mg Iron/day   Do not stop taking or change any vitamins or minerals until you talk to your nutritionist or surgeon   Your nutritionist and/or surgeon must approve all vitamin and mineral supplements   Activity and Exercise: It is important to continue walking at home. Limit your physical activity as instructed by your doctor. During this time, use these guidelines:   Do not lift anything greater than ten  (10) pounds for at least two (2) weeks   Do not go back to work or drive until Engineer, production says you can   You may have sex when you feel comfortable o It is VERY important for female patients to use a reliable birth control method; fertility often increase after surgery o Do not get pregnant for at least 18 months   Start exercising as soon as your doctor tells you that you can o Make sure your doctor approves any physical activity   Start with a simple walking program   Walk 5-15 minutes each day, 7 days per week   Slowly increase until you are walking 30-45 minutes per day   Consider joining our [place] program. [phone] or email [email or web address]   Special Instructions Things to remember:   Free counseling is available for you and your family through collaboration between [facility] and [place]. Please call [phone] and leave a message   Use your CPAP when sleeping if this applies to you   Consider buying a medical alert bracelet that says you had lap-band surgery     You will likely have your  first fill (fluid added to your band) 6 - 8 weeks after surgery   [facility] has a free Bariatric Surgery Support Group that meets monthly, the 3rd Thursday, [place]. You can see classes online at [email or web address]   It is very important to keep all follow up appointments with your surgeon, nutritionist, primary care physician, and behavioral health practitioner o After the first year, please follow up with your bariatric surgeon and nutritionist at least once a year in order to maintain best weight loss results                    [facility]:  [place]: [phone]               Bariatric Nurse Coordinator: [phone]  Gastric Bypass/Sleeve Home Care Instructions  Rev. 09/2012                                                         Reviewed and Endorsed                                                    by [facility] Patient Education Committee, Jan, 2014

## 2014-01-22 NOTE — Discharge Summary (Signed)
Physician Discharge Summary  Patient ID:  [name]  MRN: [id number]  DOB/AGE: [birth year] 63 y.o.  Admit date: 01/20/2014 Discharge date: 01/22/2014  Discharge Diagnoses:  1. Morbid obesity   Initial weight - 249, BMI - 39.2  2. Insulin dependent DM  3. Hyperlipidemia  4. OSA   On CPAP since Dec 2013.  5. CAD  6. 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 Guido Sander  8. Saw Dr. Darene Lamer. 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   No planned surgery.  9. Saw Dr. Percival Spanish 10/30/2013 for aortic stenosis   He thought that this was moderate and needed no change in therapy.  10. Left foot wound - seen at wound clinic at [facility] - Dr. Inez Catalina   Active Problems:   Morbid obesity  Operation: Procedure(s):  LAPAROSCOPIC GASTRIC SLEEVE RESECTION, UPPER GI ENDOSCOPY on 01/20/2014 - D. Lucia Gaskins  Discharged Condition: good  Hospital Course: [name] is an 63 y.o. female whose primary care physician is Redge Gainer, MD and who was admitted 01/20/2014 with a chief complaint of morbid obesity.  She was brought to the operating room on 01/20/2014 and underwent  LAPAROSCOPIC GASTRIC SLEEVE RESECTION, UPPER GI ENDOSCOPY.  The first post op day, she had a swallow which showed the sleeve anatomy looked good. She took protein drinks the second post op day which she tolerated.  She is now ready to go home.  The discharge instructions were reviewed with the patient.  Consults: None  Significant Diagnostic Studies: Results for orders placed during the hospital encounter of 01/20/14  GLUCOSE, CAPILLARY      Result Value Ref Range   Glucose-Capillary 100 (*) 70 - 99 mg/dL   Comment 1 Documented in Chart    GLUCOSE, CAPILLARY      Result Value Ref Range   Glucose-Capillary 91  70 - 99 mg/dL  GLUCOSE, CAPILLARY      Result Value Ref Range   Glucose-Capillary 83  70 - 99 mg/dL   Comment 1 Call MD NNP PA CNM    GLUCOSE, CAPILLARY      Result Value Ref  Range   Glucose-Capillary 129 (*) 70 - 99 mg/dL   Comment 1 Call MD NNP PA CNM    GLUCOSE, CAPILLARY      Result Value Ref Range   Glucose-Capillary 183 (*) 70 - 99 mg/dL   Comment 1 Notify RN    HEMOGLOBIN AND HEMATOCRIT, BLOOD      Result Value Ref Range   Hemoglobin 12.6  12.0 - 15.0 g/dL   HCT 39.1  36.0 - 46.0 %  CBC WITH DIFFERENTIAL      Result Value Ref Range   WBC 8.0  4.0 - 10.5 K/uL   RBC 4.37  3.87 - 5.11 MIL/uL   Hemoglobin 11.9 (*) 12.0 - 15.0 g/dL   HCT 36.2  36.0 - 46.0 %   MCV 82.8  78.0 - 100.0 fL   MCH 27.2  26.0 - 34.0 pg   MCHC 32.9  30.0 - 36.0 g/dL   RDW 14.4  11.5 - 15.5 %   Platelets 208  150 - 400 K/uL   Neutrophils Relative % 79 (*) 43 - 77 %   Neutro Abs 6.2  1.7 - 7.7 K/uL   Lymphocytes Relative 15  12 - 46 %   Lymphs Abs 1.2  0.7 - 4.0 K/uL   Monocytes Relative 6  3 - 12 %  Monocytes Absolute 0.5  0.1 - 1.0 K/uL   Eosinophils Relative 0  0 - 5 %   Eosinophils Absolute 0.0  0.0 - 0.7 K/uL   Basophils Relative 0  0 - 1 %   Basophils Absolute 0.0  0.0 - 0.1 K/uL  GLUCOSE, CAPILLARY      Result Value Ref Range   Glucose-Capillary 209 (*) 70 - 99 mg/dL  HEMOGLOBIN AND HEMATOCRIT, BLOOD      Result Value Ref Range   Hemoglobin 12.0  12.0 - 15.0 g/dL   HCT 37.4  36.0 - 46.0 %  GLUCOSE, CAPILLARY      Result Value Ref Range   Glucose-Capillary 197 (*) 70 - 99 mg/dL  GLUCOSE, CAPILLARY      Result Value Ref Range   Glucose-Capillary 169 (*) 70 - 99 mg/dL  GLUCOSE, CAPILLARY      Result Value Ref Range   Glucose-Capillary 164 (*) 70 - 99 mg/dL  GLUCOSE, CAPILLARY      Result Value Ref Range   Glucose-Capillary 141 (*) 70 - 99 mg/dL  GLUCOSE, CAPILLARY      Result Value Ref Range   Glucose-Capillary 152 (*) 70 - 99 mg/dL  CBC WITH DIFFERENTIAL      Result Value Ref Range   WBC 10.8 (*) 4.0 - 10.5 K/uL   RBC 4.35  3.87 - 5.11 MIL/uL   Hemoglobin 11.9 (*) 12.0 - 15.0 g/dL   HCT 36.9  36.0 - 46.0 %   MCV 84.8  78.0 - 100.0 fL   MCH 27.4   26.0 - 34.0 pg   MCHC 32.2  30.0 - 36.0 g/dL   RDW 14.5  11.5 - 15.5 %   Platelets 189  150 - 400 K/uL   Neutrophils Relative % 70  43 - 77 %   Neutro Abs 7.4  1.7 - 7.7 K/uL   Lymphocytes Relative 23  12 - 46 %   Lymphs Abs 2.5  0.7 - 4.0 K/uL   Monocytes Relative 6  3 - 12 %   Monocytes Absolute 0.7  0.1 - 1.0 K/uL   Eosinophils Relative 1  0 - 5 %   Eosinophils Absolute 0.1  0.0 - 0.7 K/uL   Basophils Relative 0  0 - 1 %   Basophils Absolute 0.0  0.0 - 0.1 K/uL  GLUCOSE, CAPILLARY      Result Value Ref Range   Glucose-Capillary 124 (*) 70 - 99 mg/dL  GLUCOSE, CAPILLARY      Result Value Ref Range   Glucose-Capillary 99  70 - 99 mg/dL  GLUCOSE, CAPILLARY      Result Value Ref Range   Glucose-Capillary 108 (*) 70 - 99 mg/dL  GLUCOSE, CAPILLARY      Result Value Ref Range   Glucose-Capillary 121 (*) 70 - 99 mg/dL   Dg Ugi W/water Sol Cm  01/21/2014   CLINICAL DATA:  Post gastric sleeve procedure  EXAM: WATER SOLUBLE UPPER GI SERIES  TECHNIQUE: Single-column upper GI series was performed using water soluble contrast.  CONTRAST:  46mL OMNIPAQUE IOHEXOL 300 MG/ML  SOLN  COMPARISON:  07/19/2013  FLUOROSCOPY TIME:  1 min 53 seconds  FINDINGS: The patient is status post gastric sleeve procedure. There is prompt emptying of the esophagus into the elongated gastric remnant. No evidence of stricture. No contrast extravasation. Contrast is seen progressing from distal stomach and proximal small bowel. No extraluminal contrast material. No gastric outlet obstruction. A duodenal diverticulum is noted.  IMPRESSION:  1. Status post gastric sleeve procedure. No evidence of stricture or obstruction. No extraluminal contrast material. No gastric outlet obstruction.   Electronically Signed   By: Lahoma Crocker M.D.   On: 01/21/2014 11:00   Discharge Exam:  Filed Vitals:   01/22/14 0513  BP: 153/71  Pulse: 101  Temp: 99 F (37.2 C)  Resp: 16    General: Obese WF who is alert.  Lungs: Clear to  auscultation and symmetric breath sounds. Heart:  RRR. No murmur or rub. Abdomen: Soft.  Normal bowel sounds. Wounds looks good.  Discharge Medications:     Medication List         ACCU-CHEK AVIVA PLUS test strip  Generic drug:  glucose blood     acetaminophen 500 MG tablet  Commonly known as:  TYLENOL  Take 500 mg by mouth every 6 (six) hours as needed for mild pain.     aspirin EC 325 MG tablet  Take 325 mg by mouth daily.     B-D INS SYR ULTRAFINE 1CC/31G 31G X 5/16" 1 ML Misc  Generic drug:  Insulin Syringe-Needle U-100     Canagliflozin-Metformin HCl [phone] MG Tabs  Take 1 tablet by mouth every morning.     esomeprazole 40 MG capsule  Commonly known as:  NEXIUM  Take 40 mg by mouth daily at 12 noon.     fenofibrate 48 MG tablet  Commonly known as:  TRICOR  Take 1 tablet (48 mg total) by mouth daily.     insulin aspart 100 UNIT/ML injection  Commonly known as:  novoLOG  Inject 52-59 Units into the skin 3 (three) times daily before meals.     insulin glargine 100 UNIT/ML injection  Commonly known as:  LANTUS  Inject 1 mL (100 Units total) into the skin 2 (two) times daily.     metFORMIN 1000 MG tablet  Commonly known as:  GLUCOPHAGE  Take 1 tablet (1,000 mg total) by mouth every evening.     metoprolol 50 MG tablet  Commonly known as:  LOPRESSOR  Take 1.5 tablets (75 mg total) by mouth 2 (two) times daily.     olmesartan-hydrochlorothiazide 40-25 MG per tablet  Commonly known as:  BENICAR HCT  Take 1 tablet by mouth every morning.     Pitavastatin Calcium 4 MG Tabs  Take 1 tablet (4 mg total) by mouth daily.        Disposition: 01-Home or Self Care      Discharge Instructions   Increase activity slowly    Complete by:  As directed             Activity:  Driving - May drive in 3 or 4 days   Lifting - Take it easy for 1 week, then no limit  Wound Care:   May start showers tonight  Diet:  Post sleeve gastrectomy diet  Follow up  appointment:  Call Dr. Pollie Friar office [facility]) at [phone] for an appointment in 2 weeks        Make an appt with Dr. Laurance Flatten in 1 to 2 weeks.  Medications and dosages:  Resume your home medications.  You have a prescription for:  Oxycodone elixir             Check you blood sugars regularly and adjust your diabetes medication appropriately.   Signed: Alphonsa Overall, M.D., [facility] Office:  [phone]  01/22/2014, 8:40 AM

## 2014-01-24 ENCOUNTER — Telehealth (HOSPITAL_COMMUNITY): Payer: Self-pay

## 2014-01-24 DIAGNOSIS — E1169 Type 2 diabetes mellitus with other specified complication: Secondary | ICD-10-CM | POA: Diagnosis present

## 2014-01-24 DIAGNOSIS — L97509 Non-pressure chronic ulcer of other part of unspecified foot with unspecified severity: Secondary | ICD-10-CM | POA: Diagnosis not present

## 2014-01-24 NOTE — Telephone Encounter (Addendum)
Attempted discharge phone call to patient per DROP protocol. Asking the following questions.    1. Do you have someone to care for you now that you are home?  yes 2. Are you having pain now that is not relieved by your pain medication?  no 3. Are you able to drink the recommended daily amount of fluids (48 ounces minimum/day) and protein (60-80 grams/day) as prescribed by the dietitian or nutritional counselor? yes   4. Are you taking the vitamins and minerals as prescribed?  yes 5. Do you have the "on call" number to contact your surgeon if you have a problem or question?  yes 6. Are your incisions free of redness, swelling or drainage? (If steri strips, address that these can fall off, shower as tolerated) yes  7. Have your bowels moved since your surgery?  If not, are you passing gas?  yes 8. Are you up and walking 3-4 times per day?  yes 9. Do you have an appointment to see a dietitian or nutritional counselor in the next month?  yes  The following questions can be added at the center's discharge phone call script at the center's discretion.  The questions are also captured within [employer] project custom fields. 1. Do you have an appointment made to see your surgeon in the next month?  yes 2. Were you provided your discharge medications before your surgery or before you were discharged from the hospital and are you taking them without problem?  yes 3. Were you provided phone numbers to the clinic/surgeon's office? yes  4. Did you watch the patient education video module in the (clinic, surgeon's office, etc.) before your surgery? yes 5. Do you have a discharge checklist that was provided to you in the hospital to reference with instructions on how to take care of yourself after surgery?  yes 6. Did you see a dietitian or nutritional counselor while you were in the hospital?  yes

## 2014-01-27 ENCOUNTER — Telehealth: Payer: Self-pay | Admitting: Family Medicine

## 2014-01-27 NOTE — Telephone Encounter (Signed)
Patient was called - since her gastric sleeve surgery she has been off metformin, invokana and lantus.  She has been take Novolog as needed 20 to 58 units once or twice a day.  Her BG was 190 this morning and she gave 58 units of Novolog 1 hour ago and now BG is 180.  Explained that Novolog will continue to work for next 1-2 hours.  Continue to check BG every hour for next 4-5 hours.   Her diet is still mostly liquid type foods. I recommended that she restart Lantus 20 units tomorrow in the morning and continue to check BG tid.  Call office if BG les less than 80 or greater than 200,

## 2014-01-31 ENCOUNTER — Encounter: Payer: Self-pay | Admitting: Family Medicine

## 2014-01-31 ENCOUNTER — Ambulatory Visit (INDEPENDENT_AMBULATORY_CARE_PROVIDER_SITE_OTHER): Payer: Commercial Managed Care - HMO | Admitting: Family Medicine

## 2014-01-31 VITALS — BP 113/66 | HR 58 | Temp 97.0°F | Ht 68.0 in | Wt 226.0 lb

## 2014-01-31 DIAGNOSIS — L97509 Non-pressure chronic ulcer of other part of unspecified foot with unspecified severity: Secondary | ICD-10-CM

## 2014-01-31 DIAGNOSIS — IMO0002 Reserved for concepts with insufficient information to code with codable children: Secondary | ICD-10-CM

## 2014-01-31 DIAGNOSIS — E11621 Type 2 diabetes mellitus with foot ulcer: Secondary | ICD-10-CM

## 2014-01-31 DIAGNOSIS — L97529 Non-pressure chronic ulcer of other part of left foot with unspecified severity: Secondary | ICD-10-CM

## 2014-01-31 DIAGNOSIS — S91102S Unspecified open wound of left great toe without damage to nail, sequela: Secondary | ICD-10-CM

## 2014-01-31 DIAGNOSIS — E1169 Type 2 diabetes mellitus with other specified complication: Secondary | ICD-10-CM

## 2014-01-31 DIAGNOSIS — J069 Acute upper respiratory infection, unspecified: Secondary | ICD-10-CM

## 2014-01-31 DIAGNOSIS — Z9884 Bariatric surgery status: Secondary | ICD-10-CM

## 2014-01-31 MED ORDER — AZITHROMYCIN 250 MG PO TABS: ORAL_TABLET | ORAL | Status: DC

## 2014-01-31 NOTE — Progress Notes (Signed)
Subjective:    Patient ID: [name], female    DOB: Jul 20, 1951, 63 y.o.   MRN: [id number]  HPI Patient here toady for follow up on medications since her gastric sleeve/bypass surgery. The patient also complains of upper respiratory symptoms with left ear pain and sinus pressure        Patient Active Problem List   Diagnosis Date Noted  . Osteopenia 10/09/2013  . Nonhealing nonsurgical wound 09/17/2013  . Abnormal ankle brachial index 09/11/2013  . Hypomagnesemia 08/01/2013  . Morbid obesity 06/26/2013  . DYSPNEA 08/18/2010  . MYOCARDIAL PERFUSION SCAN, WITH STRESS TEST, ABNORMAL 08/04/2010  . ELECTROCARDIOGRAM, ABNORMAL 07/28/2010  . Edema extremities 12/30/2008  . DM 12/26/2008  . DYSLIPIDEMIA 12/26/2008  . HYPERTENSION 12/26/2008  . CAD 12/26/2008  . PERICARDIAL EFFUSION 12/26/2008  . GERD 12/26/2008  . OSTEOARTHRITIS 12/26/2008   Outpatient Encounter Prescriptions as of 01/31/2014  Medication Sig  . ACCU-CHEK AVIVA PLUS test strip   . acetaminophen (TYLENOL) 500 MG tablet Take 500 mg by mouth every 6 (six) hours as needed for mild pain.  . B-D INS SYR ULTRAFINE 1CC/31G 31G X 5/16" 1 ML MISC   . calcium carbonate (OS-CAL) 1250 MG chewable tablet Chew 1 tablet by mouth 3 (three) times daily.  Marland Kitchen esomeprazole (NEXIUM) 40 MG capsule Take 40 mg by mouth daily at 12 noon.  . fenofibrate (TRICOR) 48 MG tablet Take 1 tablet (48 mg total) by mouth daily.  . insulin aspart (NOVOLOG) 100 UNIT/ML injection Inject 52-59 Units into the skin 3 (three) times daily before meals.  . insulin glargine (LANTUS) 100 UNIT/ML injection Inject 50 Units into the skin daily.  . metoprolol (LOPRESSOR) 50 MG tablet Take 1.5 tablets (75 mg total) by mouth 2 (two) times daily.  . Multiple Vitamin (MULTIVITAMIN WITH MINERALS) TABS tablet Take 1 tablet by mouth 2 (two) times daily.  Marland Kitchen olmesartan-hydrochlorothiazide (BENICAR HCT) 40-25 MG per tablet Take 0.5 tablets by mouth daily.   .  Pitavastatin Calcium 4 MG TABS Take 1 tablet (4 mg total) by mouth daily.  . [DISCONTINUED] insulin glargine (LANTUS) 100 UNIT/ML injection Inject 1 mL (100 Units total) into the skin 2 (two) times daily.  . [DISCONTINUED] aspirin EC 325 MG tablet Take 325 mg by mouth daily.  . [DISCONTINUED] Canagliflozin-Metformin HCl [phone] MG TABS Take 1 tablet by mouth every morning.  . [DISCONTINUED] metFORMIN (GLUCOPHAGE) 1000 MG tablet Take 1 tablet (1,000 mg total) by mouth every evening.    Review of Systems  Constitutional: Negative.   HENT: Positive for ear pain (left ear stopped up) and sinus pressure.   Eyes: Positive for itching.  Respiratory: Negative.   Cardiovascular: Negative.   Gastrointestinal: Negative.   Endocrine: Negative.   Genitourinary: Negative.   Musculoskeletal: Negative.   Skin: Negative.   Allergic/Immunologic: Negative.   Neurological: Negative.   Hematological: Negative.   Psychiatric/Behavioral: Negative.        Objective:   Physical Exam  Nursing note and vitals reviewed. Constitutional: She is oriented to person, place, and time. She appears well-developed and well-nourished. No distress.  Patient looks excellent, has a positive attitude, following recent gastric sleeve bypass surgery  HENT:  Head: Normocephalic and atraumatic.  Right Ear: External ear normal.  Left Ear: External ear normal.  Mouth/Throat: No oropharyngeal exudate.  B. throat is slightly ribs posteriorly and there is nasal congestion bilaterally   Eyes: Conjunctivae and EOM are normal. Pupils are equal, round, and reactive to light. Right eye  exhibits no discharge. Left eye exhibits no discharge. No scleral icterus.  Neck: Normal range of motion. Neck supple. No thyromegaly present.  Cardiovascular: Normal rate, regular rhythm and normal heart sounds.   No murmur heard. The heart has a regular rate and rhythm at 72 per minute  Pulmonary/Chest: Effort normal and breath sounds normal. No  respiratory distress. She has no wheezes. She has no rales. She exhibits no tenderness.  Abdominal: Soft. Bowel sounds are normal. She exhibits no mass. There is tenderness (there is generalized tenderness. The laparoscopic incision sites look good.). There is no rebound and no guarding.  Musculoskeletal: Normal range of motion. She exhibits no edema and no tenderness.  Lymphadenopathy:    She has no cervical adenopathy.  Neurological: She is alert and oriented to person, place, and time.  Skin: Skin is warm and dry. No rash noted.  The small ulcer between the left great toe and second toe appears to be healing.  Psychiatric: She has a normal mood and affect. Her behavior is normal. Judgment and thought content normal.   BP 113/66  Pulse 58  Temp(Src) 97 F (36.1 C) (Oral)  Ht 5\' 8"  (1.727 m)  Wt 226 lb (102.513 kg)  BMI 34.37 kg/m2        Assessment & Plan:  1. URI (upper respiratory infection)  2. Status post gastric bypass for obesity  3. Toe ulcer, left, with unspecified severity  4. Type 2 diabetes mellitus with foot ulcer Meds ordered this encounter  Medications  . insulin glargine (LANTUS) 100 UNIT/ML injection    Sig: Inject 50 Units into the skin daily.  . Multiple Vitamin (MULTIVITAMIN WITH MINERALS) TABS tablet    Sig: Take 1 tablet by mouth 2 (two) times daily.  . calcium carbonate (OS-CAL) 1250 MG chewable tablet    Sig: Chew 1 tablet by mouth 3 (three) times daily.  Marland Kitchen azithromycin (ZITHROMAX) 250 MG tablet    Sig: As directed    Dispense:  6 tablet    Refill:  0   Patient Instructions  Drink plenty of fluids Take Tylenol as needed for aches pains and fever Use saline nose spray for nasal congestion Take antibiotic as directed Mucinex maximum strength, blue and white in color, over-the-counter, one twice daily as needed for cough and chest congestion.  Keep the wound cleansed well and make sure that he gets plenty of air. Followup with the wound center  as planned. We will arrange for you to come back and see the clinical pharmacist, and you'll bring the blood pressure sheets and blood sugar sheets were reviewed for her to review at that time. Hopefully we can reduce your medication as time goes along. Go ahead today and reduce the metoprolol from 75 twice a day to 50 twice a day.   Arrie Senate MD

## 2014-01-31 NOTE — Addendum Note (Signed)
Addended by: Zannie Cove on: 01/31/2014 04:44 PM   Modules accepted: Orders

## 2014-01-31 NOTE — Patient Instructions (Addendum)
Drink plenty of fluids Take Tylenol as needed for aches pains and fever Use saline nose spray for nasal congestion Take antibiotic as directed Mucinex maximum strength, blue and white in color, over-the-counter, one twice daily as needed for cough and chest congestion.  Keep the wound cleansed well and make sure that he gets plenty of air. Followup with the wound center as planned. We will arrange for you to come back and see the clinical pharmacist, and you'll bring the blood pressure sheets and blood sugar sheets were reviewed for her to review at that time. Hopefully we can reduce your medication as time goes along. Go ahead today and reduce the metoprolol from 75 twice a day to 50 twice a day.

## 2014-02-03 ENCOUNTER — Other Ambulatory Visit: Payer: Self-pay | Admitting: Dermatology

## 2014-02-04 ENCOUNTER — Encounter: Payer: Medicare HMO | Attending: Surgery

## 2014-02-04 DIAGNOSIS — Z713 Dietary counseling and surveillance: Secondary | ICD-10-CM | POA: Insufficient documentation

## 2014-02-04 DIAGNOSIS — K219 Gastro-esophageal reflux disease without esophagitis: Secondary | ICD-10-CM | POA: Insufficient documentation

## 2014-02-04 DIAGNOSIS — E785 Hyperlipidemia, unspecified: Secondary | ICD-10-CM | POA: Insufficient documentation

## 2014-02-04 DIAGNOSIS — E119 Type 2 diabetes mellitus without complications: Secondary | ICD-10-CM | POA: Insufficient documentation

## 2014-02-04 DIAGNOSIS — G4733 Obstructive sleep apnea (adult) (pediatric): Secondary | ICD-10-CM | POA: Insufficient documentation

## 2014-02-04 NOTE — Progress Notes (Signed)
Bariatric Class:  Appt start time: 1530 end time:  1630.  2 Week Post-Operative Nutrition Class  Patient was seen on 02/04/2014 for Post-Operative Nutrition education [facility].   Surgery date: 01/20/2014 Surgery type: Gastric sleeve Start weight at [facility]: 251 on 10/02/13 Weight today: 253.5 lbs Weight change: 28.5 lbs  TANITA  BODY COMP RESULTS Patient unable to use Tanita at pre op class  02/04/2014   BMI (kg/m^2) 34.7   Fat Mass (lbs) 103   Fat Free Mass (lbs) 122   Total Body Water (lbs) 89.5    The following the learning objectives were met by the patient during this course:  Identifies Phase 3A (Soft, High Proteins) Dietary Goals and will begin from 2 weeks post-operatively to 2 months post-operatively  Identifies appropriate sources of fluids and proteins   States protein recommendations and appropriate sources post-operatively  Identifies the need for appropriate texture modifications, mastication, and bite sizes when consuming solids  Identifies appropriate multivitamin and calcium sources post-operatively  Describes the need for physical activity post-operatively and will follow MD recommendations  States when to call healthcare provider regarding medication questions or post-operative complications  Handouts given during class include:  Phase 3A: Soft, High Protein Diet Handout  Band Fill Guidelines Handout  Follow-Up Plan: Patient will follow-up at [facility] in 4 weeks for 6 week post-op nutrition visit for diet advancement per MD.

## 2014-02-05 ENCOUNTER — Encounter (INDEPENDENT_AMBULATORY_CARE_PROVIDER_SITE_OTHER): Payer: Self-pay | Admitting: Surgery

## 2014-02-05 ENCOUNTER — Other Ambulatory Visit (INDEPENDENT_AMBULATORY_CARE_PROVIDER_SITE_OTHER): Payer: Self-pay

## 2014-02-05 ENCOUNTER — Ambulatory Visit (INDEPENDENT_AMBULATORY_CARE_PROVIDER_SITE_OTHER): Payer: PRIVATE HEALTH INSURANCE | Admitting: Surgery

## 2014-02-05 DIAGNOSIS — K219 Gastro-esophageal reflux disease without esophagitis: Secondary | ICD-10-CM

## 2014-02-05 DIAGNOSIS — Z903 Acquired absence of stomach [part of]: Secondary | ICD-10-CM | POA: Insufficient documentation

## 2014-02-05 DIAGNOSIS — I2510 Atherosclerotic heart disease of native coronary artery without angina pectoris: Secondary | ICD-10-CM

## 2014-02-05 DIAGNOSIS — E785 Hyperlipidemia, unspecified: Secondary | ICD-10-CM

## 2014-02-05 DIAGNOSIS — Z9889 Other specified postprocedural states: Secondary | ICD-10-CM

## 2014-02-05 NOTE — Progress Notes (Signed)
Re:   [name] DOB:   1951-06-03 MRN:   [id number]  ASSESSMENT AND PLAN: 1.  Laparoscopic sleeve gastrectomy - 01/20/2014 - D. NewmanMorbid obesity  Initial weight - 249, BMI - 39.2  Has done well.  Some head cold stuff.  She'll see me back in 3 months and we will get lab check at 3 months.   2.  Insulin dependent DM  Off Novalog and metformin.  She is taking 50 Lantus QAM 3.  Hyperlipidemia 4.  OSA  On CPAP since Dec 2013. 5.  CAD 6.  GERD 7.  Left parotid tumor - pleomorphic adenoma  Excised 09/07/2012 - Dr. Guido Sander 8.  Saw Dr. Darene Lamer. 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    No planned surgery. 9.  Saw Dr. Percival Spanish 10/30/2013 for aortic stenosis  He thought that this was moderate and needed no change in therapy. 10.  Left foot ulcer- seen at wound clinic at [facility] - Dr. Inez Catalina 11.  Chronic back problems.  Chief Complaint  Patient presents with  . Bariatric Follow Up   REFERRING PHYSICIAN: Redge Gainer, MD  HISTORY OF PRESENT ILLNESS: [name] is a 63 y.o. (DOB: 20-Jul-1951)  white  female whose primary care physician is Redge Gainer, MD and comes to me today for bariatric sugery. She comes by herself today.  She has done very well, better than I would have expected.  She has had some head cold for which Dr. Laurance Flatten suggested some meds.  She is not running a fever and doing well with her po intake.  The dietitian started her on solid food the other day. She is already down about 20 pounds. I have encouraged her to increase her exercise.    History of weight loss surgery: The patient is interested in a sleeve gastrectomy. She heard Dr. Lilyan Punt at our information session. She has been overweight since her teenage years. She says when she got married she weighed 149 pounds. After her three children, she has steadily gained weight. She has tried Weight Watchers, Atkins, and low calorie diets. Her best success with weight loss and a diet was with "don't  between meals" diet. She tried a weight loss medicine through Dr. Cloyd Stagers - but it made her tachycardic and she stopped it.  She knows some friends who have had weight loss surgery, but she is unsure of what they have done.   I have encouraged her to go to the support group to see patients with different operations.  I discussed that her GERD may limit a sleeve resection.  We will see what the UGI shows.  Though her weight is in the low range for bariatric surgery - all her weight is in her abdomen.  Any weight that she loses will be to her benefit.  She does admit to being a "nibbler" - but I told her that this could defeat any of the bariatric surgical options.  UGI - 1. Status post cholecystectomy. 2. Hiatal hernia associated with small stricture which upholds the barium tablet, reproducing the patient's symptoms of food sticking. Psych - Seen by Dr. Candis Schatz.  Has snacking issues.   Past Medical History  Diagnosis Date  . 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 Hyperlipidemia   . Cystocele   . IBS (irritable bowel syndrome)   . Varicose veins   . Superficial phlebitis   . Esophageal stricture   . Fatty liver   .  Osteopenia   . Vitamin D deficiency   . Dyslipidemia   . GERD (gastroesophageal reflux disease)   . Osteoarthritis   . Pericardial effusion     a. Mod by echo 2012 at time of PNA. b. Echo 07/2012: small pericardial effusion vs fat.  . Hypertension     dr Percival Spanish  . Obesity   . CAD (coronary artery disease)     a. Nonobstructive by cath 2006 - 25% LAD, 25-30% after 1st diag, distal 25% PDA, minor irreg LCx. b. Normal nuc 2011.  Marland Kitchen Hiatal hernia     a. 07/2013: HH with small stricture holding up barium tablet (concides with patient's sx of food sticking).  . 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  . Morbid obesity   . Abnormal Doppler ultrasound of carotid artery     a. 05/2013 - mild plaque, 1-39% BICA, abnormal appearrance of  thyroid (TSH wnl in 06/2013).  . Palpitations     a. PVC/PAC on EKG 08/2013.  Marland Kitchen Sleep apnea     CPAP currently broke, calling company about machine, pt does not know cpap settings, uses cpap off and on  . Family history of anesthesia complication     daughter slow to awaken after teeth extraction at age 63  . Toe ulcer 01-08-14    Left big toe-being tx. at wound center "is healing"  . PONV (postoperative nausea and vomiting)     n/v, and room spinning after cataract and parotid surgery  . Difficult intubation 09-07-2012    big neck trouble with intubation parotid surgery"takes little med to sedate"      Past Surgical History  Procedure Laterality Date  . Carpal tunnel release  5 yrs ago    Left  . Cataract extraction Bilateral yrs ago  . Cardiac catheterization      06  . Parotidectomy  09/07/2012    Procedure: PAROTIDECTOMY;  Surgeon: Melida Quitter, MD;  Location: [place];  Service: ENT;  Laterality: Left;  LEFT PAROTIDECTOMY  . Breath tek h pylori N/A 07/29/2013    Procedure: BREATH TEK H PYLORI;  Surgeon: Shann Medal, MD;  Location: [facility];  Service: General;  Laterality: N/A;  . Tubal ligation  yrs ago  . Tonsillectomy  1968  . Cholecystectomy  1982  . Esophagogastroduodenoscopy N/A 12/27/2013    Procedure: ESOPHAGOGASTRODUODENOSCOPY (EGD);  Surgeon: Shann Medal, MD;  Location: [facility];  Service: General;  Laterality: N/A;  . Laparoscopic gastric sleeve resection N/A 01/20/2014    Procedure: LAPAROSCOPIC GASTRIC SLEEVE RESECTION;  Surgeon: Shann Medal, MD;  Location: [facility];  Service: General;  Laterality: N/A;  . Upper gi endoscopy  01/20/2014    Procedure: UPPER GI ENDOSCOPY;  Surgeon: Shann Medal, MD;  Location: [facility];  Service: General;;      Current Outpatient Prescriptions  Medication Sig Dispense Refill  . ACCU-CHEK AVIVA PLUS test strip       . acetaminophen (TYLENOL) 500 MG tablet Take 500 mg by mouth every 6 (six) hours as needed for mild pain.       . B-D INS SYR ULTRAFINE 1CC/31G 31G X 5/16" 1 ML MISC       . calcium carbonate (OS-CAL) 1250 MG chewable tablet Chew 1 tablet by mouth 3 (three) times daily.      . fenofibrate (TRICOR) 48 MG tablet Take 1 tablet (48 mg total) by mouth daily.  30 tablet  1  . insulin  aspart (NOVOLOG) 100 UNIT/ML injection Inject 52-59 Units into the skin 3 (three) times daily before meals.  10 mL  1  . insulin glargine (LANTUS) 100 UNIT/ML injection Inject 50 Units into the skin daily.      . metoprolol (LOPRESSOR) 50 MG tablet Take 1.5 tablets (75 mg total) by mouth 2 (two) times daily.  45 tablet  1  . Multiple Vitamin (MULTIVITAMIN WITH MINERALS) TABS tablet Take 1 tablet by mouth 2 (two) times daily.      Marland Kitchen olmesartan-hydrochlorothiazide (BENICAR HCT) 40-25 MG per tablet Take 0.5 tablets by mouth daily.       . Pitavastatin Calcium 4 MG TABS Take 1 tablet (4 mg total) by mouth daily.  30 tablet  11  . esomeprazole (NEXIUM) 40 MG capsule Take 40 mg by mouth daily at 12 noon.      . [DISCONTINUED] Olmesartan-Amlodipine-HCTZ (TRIBENZOR) 40-5-12.5 MG TABS Take 1 tablet by mouth daily.         No current facility-administered medications for this visit.      Allergies  Allergen Reactions  . Ezetimibe-Simvastatin Other (See Comments)    Leg cramps  . Penicillins Other (See Comments)    Fainted  (Tolerated Ancef)  . Celebrex [Celecoxib] Rash  . Levemir [Insulin Detemir] Rash    REVIEW OF SYSTEMS: Cardiac:  She sees Dr. Percival Spanish from a cardiology standpoint.  She had a heart cath about 2010.  No history of seeing a cardiologist. Pulmonary:  OSA.   On CPAP since Dec 2013.  She did see Dr. Melvyn Novas, but he has released her.  Left parotid tumor excised by Dr. Redmond Baseman - Jan 2014.  Endocrine:  IDDM since 2008.  She first started her DM in 1988. No thyroid disease.  She says that her Magnesium and Vit D are low. Gastrointestinal:  Has GERD on Nexium.  .   She said that Dr. Fuller Plan "stretched her esophus about 7  years ago.  No history of liver disease.  Open cholecystectomy - 1982.  .  No history of pancreas disease.  No history of colon disease.  Had neg colonoscopy by Dr. Fuller Plan - 2009. Urologic:  No history of kidney stones.  No history of bladder infections. GYN:  Sees Dr. Harrington Challenger.  They have talked about a hysterectomy, but not now. Musculoskeletal:  Chronic degenerative back disease.  She saw Dr. Linden Dolin, but not interested in seeing him any more.  She has a "hole" in her left big toe. Psycho-social:  Seen by Dr. Candis Schatz.  SOCIAL and FAMILY HISTORY: Married.  Her husband just retired from [employer] (12/2013).  He worked maintenance there. On disability for back issues and DM since 2012. She has 3 daughters - 15, 50, and 41.  Her 80 yo daughter is a Marine scientist at [employer].  Her other two daughters are respiratory therapists at [employer]. [name] is her daughter who is a resp therapist at [employer]. She has 3 grandchildren whom she wants to play with more.  PHYSICAL EXAM: BP 136/80  Pulse 71  Temp(Src) 97 F (36.1 C)  Ht 5\' 7"  (1.702 m)  Wt 224 lb (101.606 kg)  BMI 35.08 kg/m2  General: Obese WF who is alert and generally healthy appearing.  HEENT: Normal. Pupils equal. Neck: Supple. No mass.  No thyroid mass. Lymph Nodes:  No supraclavicular or cervical nodes. Lungs: Clear to auscultation and symmetric breath sounds. Heart:  RRR. No murmur or rub. Abdomen: Soft. No mass. Normal bowel sounds.  All her weight  is in her abdomen.  All her incisions look good.  DATA REVIEWED: Epic notes.    Alphonsa Overall, MD,  [facility], [address]  [place], [place]    [place] Phone:  [phone] FAX:  [phone]

## 2014-02-07 DIAGNOSIS — E1169 Type 2 diabetes mellitus with other specified complication: Secondary | ICD-10-CM | POA: Diagnosis not present

## 2014-02-07 DIAGNOSIS — L97509 Non-pressure chronic ulcer of other part of unspecified foot with unspecified severity: Secondary | ICD-10-CM | POA: Diagnosis not present

## 2014-02-17 ENCOUNTER — Other Ambulatory Visit: Payer: Self-pay | Admitting: Family Medicine

## 2014-02-20 ENCOUNTER — Other Ambulatory Visit: Payer: Self-pay

## 2014-02-20 ENCOUNTER — Encounter: Payer: Self-pay | Admitting: Pharmacist

## 2014-02-20 ENCOUNTER — Ambulatory Visit (INDEPENDENT_AMBULATORY_CARE_PROVIDER_SITE_OTHER): Payer: Commercial Managed Care - HMO | Admitting: Pharmacist

## 2014-02-20 VITALS — BP 138/70 | HR 77 | Ht 67.0 in | Wt 221.0 lb

## 2014-02-20 DIAGNOSIS — R300 Dysuria: Secondary | ICD-10-CM

## 2014-02-20 DIAGNOSIS — IMO0001 Reserved for inherently not codable concepts without codable children: Secondary | ICD-10-CM

## 2014-02-20 DIAGNOSIS — E785 Hyperlipidemia, unspecified: Secondary | ICD-10-CM

## 2014-02-20 DIAGNOSIS — R809 Proteinuria, unspecified: Secondary | ICD-10-CM

## 2014-02-20 DIAGNOSIS — E669 Obesity, unspecified: Secondary | ICD-10-CM

## 2014-02-20 DIAGNOSIS — E1169 Type 2 diabetes mellitus with other specified complication: Secondary | ICD-10-CM

## 2014-02-20 DIAGNOSIS — N390 Urinary tract infection, site not specified: Secondary | ICD-10-CM

## 2014-02-20 DIAGNOSIS — E1165 Type 2 diabetes mellitus with hyperglycemia: Secondary | ICD-10-CM

## 2014-02-20 DIAGNOSIS — R635 Abnormal weight gain: Secondary | ICD-10-CM

## 2014-02-20 LAB — POCT UA - MICROALBUMIN: Microalbumin Ur, POC: 100 mg/L

## 2014-02-20 LAB — POCT UA - MICROSCOPIC ONLY
Bacteria, U Microscopic: NEGATIVE
Casts, Ur, LPF, POC: NEGATIVE
Crystals, Ur, HPF, POC: NEGATIVE
RBC, urine, microscopic: NEGATIVE
Yeast, UA: NEGATIVE

## 2014-02-20 LAB — POCT GLYCOSYLATED HEMOGLOBIN (HGB A1C): Hemoglobin A1C: 7.8

## 2014-02-20 LAB — POCT URINALYSIS DIPSTICK
Bilirubin, UA: NEGATIVE
Blood, UA: NEGATIVE
Glucose, UA: 250
Nitrite, UA: NEGATIVE
Spec Grav, UA: 1.02
Urobilinogen, UA: NEGATIVE
pH, UA: 5

## 2014-02-20 MED ORDER — INSULIN PEN NEEDLE 31G X 8 MM MISC: Status: DC

## 2014-02-20 MED ORDER — PITAVASTATIN CALCIUM 4 MG PO TABS: ORAL_TABLET | ORAL | Status: DC

## 2014-02-20 MED ORDER — FENOFIBRATE 54 MG PO TABS: 54.0000 mg | ORAL_TABLET | Freq: Every day | ORAL | Status: DC

## 2014-02-20 MED ORDER — CIPROFLOXACIN HCL 500 MG PO TABS: 500.0000 mg | ORAL_TABLET | Freq: Two times a day (BID) | ORAL | Status: AC

## 2014-02-20 MED ORDER — "INSULIN SYRINGE-NEEDLE U-100 31G X 5/16"" 1 ML MISC": Status: DC

## 2014-02-20 NOTE — Progress Notes (Signed)
Diabetes Follow-Up Visit Chief Complaint:   Chief Complaint  Patient presents with  . Obesity  . Diabetes     Filed Vitals:   02/20/14 1412  BP: 138/70  Pulse: 77     HPI: Patient with type 2 DM treated with insulin.  She recently has gastric sleeve surgery 01/20/14.  She has had non complications and has lost 29#since 11/11/13.  Currently she is taking Lantus 50 units qam and uses Novolog 22u in BG is elevated.  BG ranges from 99 to 197 but average is 150.  A1c today was 7.8% which is down from 8.9% from 10/09/13.  Hyperlipidemia - taking pitavastatin 4mg  daily and fenofibrate 48mg  daily. Patient reports that her insurance prefers fenofibrate 54mg  - copay if 1/2 of the 48mg .  Wants to know if she can change.   She is due lab recheck but she is not fasting today. With weight loss and dietary changes fenofibrate may not be necessary in the future.  Diet - she is beginning to eat solid foods again - cottage cheese, slices ham and [place], wendy's chili.  Also drinking Slim Fast drinks.  She is taking MVI and other recommended vitamins regularly.  Home BG Monitoring:  Checking  1-2 times a day. Average:  150  High: 199 Low:  99  Low fat/carbohydrate diet?  Yes Nicotine Abuse?  No Medication Compliance?  Yes Exercise?  Yes Alcohol Abuse?  No   Exam Edema:  trace  Polyuria:  Positive and also reports dysuria and back pain  Polydipsia:  negative Polyphagia:  negative  BMI:  Body mass index is 34.61 kg/(m^2).   Weight changes:  Decreased 29# General Appearance:  alert, oriented, no acute distress and obese Mood/Affect:  normal   Lab Results  Component Value Date   HGBA1C 7.8 02/20/2014    No results found for this basenameDerl [name]    Lab Results  Component Value Date   CHOL 213* 10/09/2013   HDL 56 10/09/2013   LDLCALC 105* 10/09/2013   LDLDIRECT 113* 02/26/2013   TRIG 261* 10/09/2013   CHOLHDL 4.8* 06/05/2013      Assessment: 1.  Diabetes.  Improved control 2.   Blood Pressure.  controlled 3.  Lipids.  Labs needed - last check showed elevated Tg 4.  Obesity - great weight reduction since surgery 5. UTI  Recommendations: 1.  Medication recommendations at this time are as follows:    Change Lantus to 28 units bid  Use Novolog15 units only when BG over 180  Cipro 500mg  1 tablet bid for 7 days  Change fenofibrate to 54mg  1 tablet daily 2.  Reviewed HBG goals:  Fasting 80-130 and 1-2 hour post prandial <180.  Patient is instructed to check BG 3 times per day.    3.  BP goal < 140/85. 4.  LDL goal of < 100, HDL > 40 and TG < 150. 5.  Eye Exam yearly and Dental Exam every 6 months. 6.  Dietary recommendations:  Patient is to continue diet recommended by nutritionist with [facility] 7.  Physical Activity recommendations:  Continue increased activity 8.  Orders Placed This Encounter  Procedures  . Urine culture  . Microalbumin, urine  . POCT urinalysis dipstick  . POCT UA - Microscopic Only  . POCT UA - Microalbumin  . POCT glycosylated hemoglobin (Hb A1C)   9.  Return to clinic in 4-6 wks   Time spent counseling patient:  40 minutes  Referring provider:  Ermalinda Memos, PharmD, CPP, CDE

## 2014-02-20 NOTE — Telephone Encounter (Signed)
She should stay on this medication until we do further cholesterol checks and liver function tests. That should be coming up soon for her.

## 2014-02-20 NOTE — Telephone Encounter (Signed)
Last lipomed 2/15.

## 2014-02-21 ENCOUNTER — Encounter (HOSPITAL_BASED_OUTPATIENT_CLINIC_OR_DEPARTMENT_OTHER): Payer: Commercial Managed Care - HMO | Attending: General Surgery

## 2014-02-21 DIAGNOSIS — E1169 Type 2 diabetes mellitus with other specified complication: Secondary | ICD-10-CM | POA: Insufficient documentation

## 2014-02-21 DIAGNOSIS — L97509 Non-pressure chronic ulcer of other part of unspecified foot with unspecified severity: Secondary | ICD-10-CM | POA: Insufficient documentation

## 2014-02-21 DIAGNOSIS — L02619 Cutaneous abscess of unspecified foot: Secondary | ICD-10-CM | POA: Insufficient documentation

## 2014-02-21 DIAGNOSIS — L03039 Cellulitis of unspecified toe: Secondary | ICD-10-CM | POA: Insufficient documentation

## 2014-02-21 LAB — URINE CULTURE

## 2014-02-21 LAB — MICROALBUMIN, URINE: Microalbumin, Urine: 76.7 ug/mL — ABNORMAL HIGH (ref 0.0–17.0)

## 2014-02-24 ENCOUNTER — Telehealth (INDEPENDENT_AMBULATORY_CARE_PROVIDER_SITE_OTHER): Payer: Self-pay

## 2014-02-24 ENCOUNTER — Telehealth: Payer: Self-pay | Admitting: Pharmacist

## 2014-02-24 NOTE — Telephone Encounter (Signed)
Pt s/p gastric sleeve on 01/20/14. Pt states that she has noticed a small knot above her incision. Pt denies any redness, or tenderness. Pt denies any fevers, chills or drainage. Pt states that the area looks good and is healing. Advised pt to keep a eye on the area for redness, increased swelling or drainage. Pt verbalized understanding.

## 2014-02-24 NOTE — Telephone Encounter (Signed)
Called to give results from recent labs.  Urine microalbumin was elevated.   BG was 200 so patient increased Lantus 30 units ad BG was 142 this am.  INstructed patient to increase to 32 units if BG increase to over 150.  Call is she has questions.

## 2014-02-28 DIAGNOSIS — E1169 Type 2 diabetes mellitus with other specified complication: Secondary | ICD-10-CM | POA: Diagnosis not present

## 2014-02-28 DIAGNOSIS — L02619 Cutaneous abscess of unspecified foot: Secondary | ICD-10-CM | POA: Diagnosis not present

## 2014-02-28 DIAGNOSIS — L03039 Cellulitis of unspecified toe: Secondary | ICD-10-CM | POA: Diagnosis not present

## 2014-02-28 DIAGNOSIS — L97509 Non-pressure chronic ulcer of other part of unspecified foot with unspecified severity: Secondary | ICD-10-CM | POA: Diagnosis not present

## 2014-03-07 ENCOUNTER — Other Ambulatory Visit: Payer: Self-pay | Admitting: Family Medicine

## 2014-03-07 DIAGNOSIS — L97509 Non-pressure chronic ulcer of other part of unspecified foot with unspecified severity: Secondary | ICD-10-CM | POA: Diagnosis not present

## 2014-03-07 DIAGNOSIS — L02619 Cutaneous abscess of unspecified foot: Secondary | ICD-10-CM | POA: Diagnosis not present

## 2014-03-07 DIAGNOSIS — E1169 Type 2 diabetes mellitus with other specified complication: Secondary | ICD-10-CM | POA: Diagnosis not present

## 2014-03-07 DIAGNOSIS — L03039 Cellulitis of unspecified toe: Secondary | ICD-10-CM | POA: Diagnosis not present

## 2014-03-14 DIAGNOSIS — E1169 Type 2 diabetes mellitus with other specified complication: Secondary | ICD-10-CM | POA: Diagnosis not present

## 2014-03-14 DIAGNOSIS — L03039 Cellulitis of unspecified toe: Secondary | ICD-10-CM | POA: Diagnosis not present

## 2014-03-14 DIAGNOSIS — L97509 Non-pressure chronic ulcer of other part of unspecified foot with unspecified severity: Secondary | ICD-10-CM | POA: Diagnosis not present

## 2014-03-14 DIAGNOSIS — L02619 Cutaneous abscess of unspecified foot: Secondary | ICD-10-CM | POA: Diagnosis not present

## 2014-03-18 ENCOUNTER — Encounter: Payer: Medicare HMO | Attending: Surgery | Admitting: Dietician

## 2014-03-18 DIAGNOSIS — E785 Hyperlipidemia, unspecified: Secondary | ICD-10-CM | POA: Diagnosis not present

## 2014-03-18 DIAGNOSIS — Z713 Dietary counseling and surveillance: Secondary | ICD-10-CM | POA: Insufficient documentation

## 2014-03-18 DIAGNOSIS — G4733 Obstructive sleep apnea (adult) (pediatric): Secondary | ICD-10-CM | POA: Diagnosis not present

## 2014-03-18 DIAGNOSIS — K219 Gastro-esophageal reflux disease without esophagitis: Secondary | ICD-10-CM | POA: Diagnosis not present

## 2014-03-18 DIAGNOSIS — E119 Type 2 diabetes mellitus without complications: Secondary | ICD-10-CM | POA: Diagnosis not present

## 2014-03-18 NOTE — Patient Instructions (Addendum)
-  Flintstones complete chewable multivitamin -Work to meeting 64 oz fluid goal -Stick to 15 grams of carbs per meal    02/04/2014 03/18/14   BMI (kg/m^2) 34.7 33   Fat Mass (lbs) 103 94.5   Fat Free Mass (lbs) 122 116   Total Body Water (lbs) 89.5 85

## 2014-03-18 NOTE — Progress Notes (Signed)
  Follow-up visit:  8 Weeks Post-Operative Gastric sleeve Surgery  Medical Nutrition Therapy:  Appt start time: 1400 end time:  1430.  Primary concerns today: Post-operative Bariatric Surgery Nutrition Management.  [name] returns today reporting that she is feeling well but getting tired of eating the same foods. She also reports constipation and suspects that this is due to not drinking enough fluid. [name] also states that her back pain has significantly improved. She is weighing herself every day at home. She has difficulty tolerating chicken as it chokes her.  Surgery date: 01/20/2014 Surgery type: Gastric sleeve Start weight at [facility]: 251 on 10/02/13 Weight today: 210.5 lbs Weight change: 43 lbs  TANITA  BODY COMP RESULTS Patient unable to use Tanita at pre op class  02/04/2014 03/18/14   BMI (kg/m^2) 34.7 33   Fat Mass (lbs) 103 94.5   Fat Free Mass (lbs) 122 116   Total Body Water (lbs) 89.5 85    Preferred Learning Style:   No preference indicated   Learning Readiness:   Ready  24-hr recall: B (AM): egg and some homemade chicken salad with low fat mayonnaise (12g) Snk (AM): Premier protein shake (30g) L (PM): low fat cottage cheese and chicken salad (7-14g) Snk (PM):   D (PM): grilled shrimp OR [employer] chili (7-14g) Snk (PM):   Fluid intake: water with sugar free flavoring, beef bullion, sugar free jello, protein shake (11oz); patient unsure of total amount Estimated total protein intake: 60g+ per day  Medications: on 1/2 benecar; 34 units of Lantus, no Novolog Supplementation: taking  CBG monitoring: 1x in the am Average CBG per patient: 124-150 mg/dL Last patient reported A1c: 7.8%  Using straws: once in a while Drinking while eating: no Hair loss: no Carbonated beverages: no N/V/D/C: nausea in the morning sometimes; constipation  Dumping syndrome: none  Recent physical activity:  None; toe injury  Progress Towards Goal(s):  In progress.  Handouts given  during visit include:  Phase 3B lean protein + non-starchy vegetables   Nutritional Diagnosis:  Elroy-3.3 Overweight/obesity related to past poor dietary habits and physical inactivity as evidenced by patient w/ recent gastric sleeve surgery following dietary guidelines for continued weight loss.     Intervention:  Nutrition counseling provided.  Teaching Method Utilized:  Visual Auditory  Barriers to learning/adherence to lifestyle change: toe injury limiting exercise  Demonstrated degree of understanding via:  Teach Back   Monitoring/Evaluation:  Dietary intake, exercise, and body weight. Follow up in 1.5 months for 3.5 month post-op visit.

## 2014-03-21 ENCOUNTER — Encounter (HOSPITAL_BASED_OUTPATIENT_CLINIC_OR_DEPARTMENT_OTHER): Payer: Medicare HMO | Attending: General Surgery

## 2014-03-21 ENCOUNTER — Telehealth (INDEPENDENT_AMBULATORY_CARE_PROVIDER_SITE_OTHER): Payer: Self-pay | Admitting: *Deleted

## 2014-03-21 DIAGNOSIS — L97509 Non-pressure chronic ulcer of other part of unspecified foot with unspecified severity: Secondary | ICD-10-CM | POA: Diagnosis not present

## 2014-03-21 DIAGNOSIS — E1169 Type 2 diabetes mellitus with other specified complication: Secondary | ICD-10-CM | POA: Insufficient documentation

## 2014-03-21 NOTE — Telephone Encounter (Signed)
Received a call from pt in Triage.  Pt is s/p Gastric Sleeve 01-20-14.  She has been experiencing dizziness and nausea and vomiting for the past couple of days.  Pt states that a few of her neighbors have been experiencing the same symptoms.  I asked the pt has she saw her PCP and she stated that her paperwork said for her to call our office if she had any of these symptoms.  I advised the pt, that since her surgery is 2 months out, I didn't think it had anything to do with the surgery.  I did advise pt that I would speak to one of Dr. Jennell Corner partners since he was not available and I would call her back.  Pt verbalized understanding.  Please advise!  Anderson Malta

## 2014-03-21 NOTE — Telephone Encounter (Signed)
Per [name], Dr. Brantley Stage said to let the pt know that if her n/v continued over the weekend, that she would need to go to the ER to get fluids in the ED.  Otherwise, if her symptoms continue thru the weekend, also call our office back next week and let the office speak with Dr. Lucia Gaskins when he is back.  I tried to call pt and had to leave her a message on her vmail.  [name]

## 2014-03-24 ENCOUNTER — Telehealth: Payer: Self-pay | Admitting: Family Medicine

## 2014-03-24 DIAGNOSIS — S91309A Unspecified open wound, unspecified foot, initial encounter: Secondary | ICD-10-CM

## 2014-03-24 NOTE — Telephone Encounter (Signed)
Please do a referral to the wound center for this patient

## 2014-03-24 NOTE — Telephone Encounter (Signed)
appt scheduled for tomorrow with bill

## 2014-03-25 ENCOUNTER — Encounter: Payer: Self-pay | Admitting: Family Medicine

## 2014-03-25 ENCOUNTER — Ambulatory Visit (INDEPENDENT_AMBULATORY_CARE_PROVIDER_SITE_OTHER): Payer: Commercial Managed Care - HMO | Admitting: Family Medicine

## 2014-03-25 VITALS — BP 141/75 | HR 78 | Temp 97.4°F | Wt 207.6 lb

## 2014-03-25 DIAGNOSIS — H8110 Benign paroxysmal vertigo, unspecified ear: Secondary | ICD-10-CM

## 2014-03-25 DIAGNOSIS — R110 Nausea: Secondary | ICD-10-CM

## 2014-03-25 DIAGNOSIS — H8113 Benign paroxysmal vertigo, bilateral: Secondary | ICD-10-CM

## 2014-03-25 DIAGNOSIS — J069 Acute upper respiratory infection, unspecified: Secondary | ICD-10-CM

## 2014-03-25 MED ORDER — METHYLPREDNISOLONE (PAK) 4 MG PO TABS: ORAL_TABLET | ORAL | Status: DC

## 2014-03-25 MED ORDER — PROMETHAZINE HCL 25 MG PO TABS: 25.0000 mg | ORAL_TABLET | Freq: Three times a day (TID) | ORAL | Status: DC | PRN

## 2014-03-25 MED ORDER — MECLIZINE HCL 12.5 MG PO TABS: 12.5000 mg | ORAL_TABLET | Freq: Three times a day (TID) | ORAL | Status: DC | PRN

## 2014-03-25 NOTE — Telephone Encounter (Signed)
Order put in.

## 2014-03-25 NOTE — Progress Notes (Signed)
   Subjective:    Patient ID: [name], female    DOB: [birth year], 63 y.o.   MRN: [id number]  HPI This 63 y.o. female presents for evaluation of dizziness.  She has been having uri sx's recently and has developed dizziness and has been nauseated and has been vomiting.  She states she gets dizzy just moving her head and is unable to hold fluids down.  She is in wheelchair.   Review of Systems C/o dizziness and nausea No chest pain, SOB, HA,  vision change, diarrhea, constipation, dysuria, urinary urgency or frequency, myalgias, arthralgias or rash.     Objective:   Physical Exam  Vital signs noted  Well developed well nourished obese female in wheelchair.  HEENT - Head atraumatic Normocephalic                Eyes - PERRLA, Conjuctiva - clear EOMI - nystagmus                Ears - EAC's Wnl TM's Wnl Gross Hearing WNL                OPX - Dry Respiratory - Lungs CTA bilateral Cardiac - RRR S1 and S2 without murmur GI - Abdomen soft Nontender and bowel sounds active x 4 Extremities - No edema. Neuro - Grossly intact.      Assessment & Plan:  Vertigo - antivert 25mg  one po tid prn Medrol dose pack as directed.  Phenergan 25mg  one po qid prn and IV NS one liter   URI - She is being rx'd doxycycline by her podiatrist for recent surgery on her left foot and explained this will help tx her uri as well.  Nausea - Phenergan 25mg  po qid prn  Lysbeth Penner FNP

## 2014-03-29 ENCOUNTER — Encounter (HOSPITAL_BASED_OUTPATIENT_CLINIC_OR_DEPARTMENT_OTHER): Payer: Self-pay | Admitting: Emergency Medicine

## 2014-03-29 ENCOUNTER — Emergency Department (HOSPITAL_BASED_OUTPATIENT_CLINIC_OR_DEPARTMENT_OTHER): Payer: Medicare HMO

## 2014-03-29 ENCOUNTER — Emergency Department (HOSPITAL_BASED_OUTPATIENT_CLINIC_OR_DEPARTMENT_OTHER)
Admission: EM | Admit: 2014-03-29 | Discharge: 2014-03-29 | Disposition: A | Discharge status: Home / Self Care | Payer: Medicare HMO | Attending: Emergency Medicine | Admitting: Emergency Medicine

## 2014-03-29 DIAGNOSIS — Z794 Long term (current) use of insulin: Secondary | ICD-10-CM | POA: Insufficient documentation

## 2014-03-29 DIAGNOSIS — M1990 Unspecified osteoarthritis, unspecified site: Secondary | ICD-10-CM | POA: Insufficient documentation

## 2014-03-29 DIAGNOSIS — Z8669 Personal history of other diseases of the nervous system and sense organs: Secondary | ICD-10-CM | POA: Insufficient documentation

## 2014-03-29 DIAGNOSIS — Z880 Allergy status to penicillin: Secondary | ICD-10-CM | POA: Diagnosis not present

## 2014-03-29 DIAGNOSIS — G4730 Sleep apnea, unspecified: Secondary | ICD-10-CM | POA: Insufficient documentation

## 2014-03-29 DIAGNOSIS — Z79899 Other long term (current) drug therapy: Secondary | ICD-10-CM | POA: Diagnosis not present

## 2014-03-29 DIAGNOSIS — H9209 Otalgia, unspecified ear: Secondary | ICD-10-CM | POA: Insufficient documentation

## 2014-03-29 DIAGNOSIS — J3489 Other specified disorders of nose and nasal sinuses: Secondary | ICD-10-CM | POA: Diagnosis not present

## 2014-03-29 DIAGNOSIS — Z9981 Dependence on supplemental oxygen: Secondary | ICD-10-CM | POA: Insufficient documentation

## 2014-03-29 DIAGNOSIS — Z8742 Personal history of other diseases of the female genital tract: Secondary | ICD-10-CM | POA: Diagnosis not present

## 2014-03-29 DIAGNOSIS — E119 Type 2 diabetes mellitus without complications: Secondary | ICD-10-CM | POA: Insufficient documentation

## 2014-03-29 DIAGNOSIS — I10 Essential (primary) hypertension: Secondary | ICD-10-CM | POA: Diagnosis not present

## 2014-03-29 DIAGNOSIS — R42 Dizziness and giddiness: Secondary | ICD-10-CM | POA: Insufficient documentation

## 2014-03-29 DIAGNOSIS — I2510 Atherosclerotic heart disease of native coronary artery without angina pectoris: Secondary | ICD-10-CM | POA: Insufficient documentation

## 2014-03-29 DIAGNOSIS — Z9889 Other specified postprocedural states: Secondary | ICD-10-CM | POA: Diagnosis not present

## 2014-03-29 DIAGNOSIS — H8110 Benign paroxysmal vertigo, unspecified ear: Secondary | ICD-10-CM | POA: Insufficient documentation

## 2014-03-29 DIAGNOSIS — E785 Hyperlipidemia, unspecified: Secondary | ICD-10-CM | POA: Diagnosis not present

## 2014-03-29 DIAGNOSIS — K219 Gastro-esophageal reflux disease without esophagitis: Secondary | ICD-10-CM | POA: Insufficient documentation

## 2014-03-29 DIAGNOSIS — R1110 Vomiting, unspecified: Secondary | ICD-10-CM | POA: Diagnosis not present

## 2014-03-29 DIAGNOSIS — Z872 Personal history of diseases of the skin and subcutaneous tissue: Secondary | ICD-10-CM | POA: Diagnosis not present

## 2014-03-29 DIAGNOSIS — IMO0002 Reserved for concepts with insufficient information to code with codable children: Secondary | ICD-10-CM

## 2014-03-29 LAB — BASIC METABOLIC PANEL
Anion gap: 15 (ref 5–15)
BUN: 48 mg/dL — ABNORMAL HIGH (ref 6–23)
CO2: 29 mEq/L (ref 19–32)
Calcium: 11.2 mg/dL — ABNORMAL HIGH (ref 8.4–10.5)
Chloride: 97 mEq/L (ref 96–112)
Creatinine, Ser: 1.3 mg/dL — ABNORMAL HIGH (ref 0.50–1.10)
GFR calc Af Amer: 50 mL/min — ABNORMAL LOW (ref >90)
GFR calc non Af Amer: 43 mL/min — ABNORMAL LOW (ref >90)
Glucose, Bld: 232 mg/dL — ABNORMAL HIGH (ref 70–99)
Potassium: 4.4 mEq/L (ref 3.7–5.3)
Sodium: 141 mEq/L (ref 137–147)

## 2014-03-29 LAB — CBC
HCT: 47.9 % — ABNORMAL HIGH (ref 36.0–46.0)
Hemoglobin: 16 g/dL — ABNORMAL HIGH (ref 12.0–15.0)
MCH: 28 pg (ref 26.0–34.0)
MCHC: 33.4 g/dL (ref 30.0–36.0)
MCV: 83.9 fL (ref 78.0–100.0)
Platelets: 270 10*3/uL (ref 150–400)
RBC: 5.71 MIL/uL — ABNORMAL HIGH (ref 3.87–5.11)
RDW: 15 % (ref 11.5–15.5)
WBC: 13.2 10*3/uL — ABNORMAL HIGH (ref 4.0–10.5)

## 2014-03-29 LAB — CBG MONITORING, ED: Glucose-Capillary: 241 mg/dL — ABNORMAL HIGH (ref 70–99)

## 2014-03-29 NOTE — ED Notes (Addendum)
Patient having ear pain, congestion. Dizziness

## 2014-03-29 NOTE — ED Notes (Signed)
Pt transported to CT ?

## 2014-03-29 NOTE — ED Provider Notes (Signed)
Medical screening examination/treatment/procedure(s) were performed by non-physician practitioner and as supervising physician I was immediately available for consultation/collaboration.   EKG Interpretation None       Threasa Beards, MD 03/29/14 [phone]

## 2014-03-29 NOTE — ED Notes (Signed)
CBG @ 241

## 2014-03-29 NOTE — Discharge Instructions (Signed)
Continue taking medications prescribed by your primary care physician as directed. You do not need to take Phenergan if you are not vomiting. Continue taking meclizine as prescribed. Followup with your primary care physician.  Benign Positional Vertigo Vertigo means you feel like you or your surroundings are moving when they are not. Benign positional vertigo is the most common form of vertigo. Benign means that the cause of your condition is not serious. Benign positional vertigo is more common in older adults. CAUSES  Benign positional vertigo is the result of an upset in the labyrinth system. This is an area in the middle ear that helps control your balance. This may be caused by a viral infection, head injury, or repetitive motion. However, often no specific cause is found. SYMPTOMS  Symptoms of benign positional vertigo occur when you move your head or eyes in different directions. Some of the symptoms may include:  Loss of balance and falls.  Vomiting.  Blurred vision.  Dizziness.  Nausea.  Involuntary eye movements (nystagmus). DIAGNOSIS  Benign positional vertigo is usually diagnosed by physical exam. If the specific cause of your benign positional vertigo is unknown, your caregiver may perform imaging tests, such as magnetic resonance imaging (MRI) or computed tomography (CT). TREATMENT  Your caregiver may recommend movements or procedures to correct the benign positional vertigo. Medicines such as meclizine, benzodiazepines, and medicines for nausea may be used to treat your symptoms. In rare cases, if your symptoms are caused by certain conditions that affect the inner ear, you may need surgery. HOME CARE INSTRUCTIONS   Follow your caregiver's instructions.  Move slowly. Do not make sudden body or head movements.  Avoid driving.  Avoid operating heavy machinery.  Avoid performing any tasks that would be dangerous to you or others during a vertigo episode.  Drink enough  fluids to keep your urine clear or pale yellow. SEEK IMMEDIATE MEDICAL CARE IF:   You develop problems with walking, weakness, numbness, or using your arms, hands, or legs.  You have difficulty speaking.  You develop severe headaches.  Your nausea or vomiting continues or gets worse.  You develop visual changes.  Your family or friends notice any behavioral changes.  Your condition gets worse.  You have a fever.  You develop a stiff neck or sensitivity to light. MAKE SURE YOU:   Understand these instructions.  Will watch your condition.  Will get help right away if you are not doing well or get worse. Document Released: 05/09/2006 Document Revised: 10/24/2011 Document Reviewed: 04/21/2011 [facility] Patient Information 2015 [place], [place]. This information is not intended to replace advice given to you by your health care provider. Make sure you discuss any questions you have with your health care provider.  Dizziness Dizziness is a common problem. It is a feeling of unsteadiness or light-headedness. You may feel like you are about to faint. Dizziness can lead to injury if you stumble or fall. A person of any age group can suffer from dizziness, but dizziness is more common in older adults. CAUSES  Dizziness can be caused by many different things, including:  Middle ear problems.  Standing for too long.  Infections.  An allergic reaction.  Aging.  An emotional response to something, such as the sight of blood.  Side effects of medicines.  Tiredness.  Problems with circulation or blood pressure.  Excessive use of alcohol or medicines, or illegal drug use.  Breathing too fast (hyperventilation).  An irregular heart rhythm (arrhythmia).  A low red  blood cell count (anemia).  Pregnancy.  Vomiting, diarrhea, fever, or other illnesses that cause body fluid loss (dehydration).  Diseases or conditions such as Parkinson's disease, high blood pressure  (hypertension), diabetes, and thyroid problems.  Exposure to extreme heat. DIAGNOSIS  Your health care provider will ask about your symptoms, perform a physical exam, and perform an electrocardiogram (ECG) to record the electrical activity of your heart. Your health care provider may also perform other heart or blood tests to determine the cause of your dizziness. These may include:  Transthoracic echocardiogram (TTE). During echocardiography, sound waves are used to evaluate how blood flows through your heart.  Transesophageal echocardiogram (TEE).  Cardiac monitoring. This allows your health care provider to monitor your heart rate and rhythm in real time.  Holter monitor. This is a portable device that records your heartbeat and can help diagnose heart arrhythmias. It allows your health care provider to track your heart activity for several days if needed.  Stress tests by exercise or by giving medicine that makes the heart beat faster. TREATMENT  Treatment of dizziness depends on the cause of your symptoms and can vary greatly. HOME CARE INSTRUCTIONS   Drink enough fluids to keep your urine clear or pale yellow. This is especially important in very hot weather. In older adults, it is also important in cold weather.  Take your medicine exactly as directed if your dizziness is caused by medicines. When taking blood pressure medicines, it is especially important to get up slowly.  Rise slowly from chairs and steady yourself until you feel okay.  In the morning, first sit up on the side of the bed. When you feel okay, stand slowly while holding onto something until you know your balance is fine.  Move your legs often if you need to stand in one place for a long time. Tighten and relax your muscles in your legs while standing.  Have someone stay with you for 1-2 days if dizziness continues to be a problem. Do this until you feel you are well enough to stay alone. Have the person call your  health care provider if he or she notices changes in you that are concerning.  Do not drive or use heavy machinery if you feel dizzy.  Do not drink alcohol. SEEK IMMEDIATE MEDICAL CARE IF:   Your dizziness or light-headedness gets worse.  You feel nauseous or vomit.  You have problems talking, walking, or using your arms, hands, or legs.  You feel weak.  You are not thinking clearly or you have trouble forming sentences. It may take a friend or family member to notice this.  You have chest pain, abdominal pain, shortness of breath, or sweating.  Your vision changes.  You notice any bleeding.  You have side effects from medicine that seems to be getting worse rather than better. MAKE SURE YOU:   Understand these instructions.  Will watch your condition.  Will get help right away if you are not doing well or get worse. Document Released: 01/25/2001 Document Revised: 08/06/2013 Document Reviewed: 02/18/2011 [facility] Patient Information 2015 [place], [place]. This information is not intended to replace advice given to you by your health care provider. Make sure you discuss any questions you have with your health care provider.

## 2014-03-29 NOTE — ED Provider Notes (Signed)
CSN: [id number]     Arrival date & time 03/29/14  1233 History   First MD Initiated Contact with Patient 03/29/14 1240     Chief Complaint  Patient presents with  . Dizziness     (Consider location/radiation/quality/duration/timing/severity/associated sxs/prior Treatment) HPI Comments: Patient is a 63 year old female with an extensive past medical history including diabetes, hyperlipidemia, hypertension, CAD and parotid tumor who presents to the emergency department with her daughter complaining of dizziness x2 weeks. Dizziness is positional, present when she rolls over in bed, goes from a seated to standing position or laying to sitting position. She describes it as a sensation of her spinning. Admits to associated left-sided ear pain described as her "eardrum moving when breathing". She had nasal congestion at the onset of the dizziness, however this is starting to get better. She was also vomiting 5 days ago, however is no longer vomiting. She saw her primary care physician 5 days ago who told her she was dehydrated and gave 3 bags of IV fluids in the office, sent her home on 6 days of prednisone, Phenergan and meclizine. She reports she believes the meclizine is working a little bit, however a few days ago she had a fall when she was walking. No head injury or loss of consciousness. She reports she hit the left side of her body but is denying significant pain. Denies fever, chills, chest pain, shortness of breath, confusion, facial asymmetry, speech changes, numbness or weakness. She came to the emergency department today because her daughter did not like that the primary care office told her she was dehydrated and sent her home without any testing.  Patient is a 63 y.o. female presenting with dizziness. The history is provided by the patient.  Dizziness Associated symptoms: vomiting (subsided)     Past Medical History  Diagnosis Date  . IDDM (insulin dependent diabetes mellitus)     type 2    . Hyperlipidemia   . Cystocele   . IBS (irritable bowel syndrome)   . Varicose veins   . Superficial phlebitis   . Esophageal stricture   . Fatty liver   . Osteopenia   . Vitamin D deficiency   . Dyslipidemia   . GERD (gastroesophageal reflux disease)   . Osteoarthritis   . Pericardial effusion     a. Mod by echo 2012 at time of PNA. b. Echo 07/2012: small pericardial effusion vs fat.  . Hypertension     dr Percival Spanish  . Obesity   . CAD (coronary artery disease)     a. Nonobstructive by cath 2006 - 25% LAD, 25-30% after 1st diag, distal 25% PDA, minor irreg LCx. b. Normal nuc 2011.  Marland Kitchen Hiatal hernia     a. 07/2013: HH with small stricture holding up barium tablet (concides with patient's sx of food sticking).  . 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  . Morbid obesity   . Abnormal Doppler ultrasound of carotid artery     a. 05/2013 - mild plaque, 1-39% BICA, abnormal appearrance of thyroid (TSH wnl in 06/2013).  . Palpitations     a. PVC/PAC on EKG 08/2013.  Marland Kitchen Sleep apnea     CPAP currently broke, calling company about machine, pt does not know cpap settings, uses cpap off and on  . Family history of anesthesia complication     daughter slow to awaken after teeth extraction at age 65  . Toe ulcer 01-08-14    Left big toe-being  tx. at wound center "is healing"  . PONV (postoperative nausea and vomiting)     n/v, and room spinning after cataract and parotid surgery  . Difficult intubation 09-07-2012    big neck trouble with intubation parotid surgery"takes little med to sedate"   Past Surgical History  Procedure Laterality Date  . Carpal tunnel release  5 yrs ago    Left  . Cataract extraction Bilateral yrs ago  . Cardiac catheterization      06  . Parotidectomy  09/07/2012    Procedure: PAROTIDECTOMY;  Surgeon: Melida Quitter, MD;  Location: [facility];  Service: ENT;  Laterality: Left;  LEFT PAROTIDECTOMY  . Breath tek h pylori N/A 07/29/2013    Procedure:  BREATH TEK H PYLORI;  Surgeon: Shann Medal, MD;  Location: [facility];  Service: General;  Laterality: N/A;  . Tubal ligation  yrs ago  . Tonsillectomy  1968  . Cholecystectomy  1982  . Esophagogastroduodenoscopy N/A 12/27/2013    Procedure: ESOPHAGOGASTRODUODENOSCOPY (EGD);  Surgeon: Shann Medal, MD;  Location: [facility];  Service: General;  Laterality: N/A;  . Laparoscopic gastric sleeve resection N/A 01/20/2014    Procedure: LAPAROSCOPIC GASTRIC SLEEVE RESECTION;  Surgeon: Shann Medal, MD;  Location: [facility];  Service: General;  Laterality: N/A;  . Upper gi endoscopy  01/20/2014    Procedure: UPPER GI ENDOSCOPY;  Surgeon: Shann Medal, MD;  Location: [facility];  Service: General;;   Family History  Problem Relation Age of Onset  . Heart failure Mother   . Hypertension Mother   . Skin cancer Mother   . Colitis Mother   . Diabetes Mother   . Heart disease Father   . Colon polyps Father   . Diabetes Father   . Diabetes Other    History  Substance Use Topics  . Smoking status: Never Smoker   . Smokeless tobacco: Never Used  . Alcohol Use: No   OB History   Grav Para Term Preterm Abortions TAB SAB Ect Mult Living                 Review of Systems  HENT: Positive for congestion and ear pain.   Gastrointestinal: Positive for vomiting (subsided).  Neurological: Positive for dizziness.  All other systems reviewed and are negative.     Allergies  Ezetimibe-simvastatin; Penicillins; Celebrex; and Levemir  Home Medications   Prior to Admission medications   Medication Sig Start Date End Date Taking? Authorizing Provider  ACCU-CHEK AVIVA PLUS test strip CHECK BLOOD SUGAR UP TO TWICE DAILY OR AS PRESCRIBED 03/07/14   Chipper Herb, MD  acetaminophen (TYLENOL) 500 MG tablet Take 500 mg by mouth every 6 (six) hours as needed for mild pain.    Historical Provider, MD  B Complex Vitamins (B-COMPLEX/B-12 SL) Place 500 mcg under the tongue every morning.    Historical  Provider, MD  B-D INS SYR ULTRAFINE 1CC/31G 31G X 5/16" 1 ML MISC USE TO INJECT INSULIN TWICE A DAY AS INSTRUCTED 03/07/14   Chipper Herb, MD  calcium carbonate (OS-CAL) 1250 MG chewable tablet Chew 1 tablet by mouth 3 (three) times daily.    Historical Provider, MD  doxycycline (VIBRA-TABS) 100 MG tablet  03/23/14   Historical Provider, MD  esomeprazole (NEXIUM) 40 MG capsule Take 40 mg by mouth daily at 12 noon.    Historical Provider, MD  fenofibrate 54 MG tablet Take 1 tablet (54 mg total) by mouth daily. 02/20/14   Tammy Eckard,  PHARMD  insulin aspart (NOVOLOG) 100 UNIT/ML injection Inject 15 Units into the skin daily as needed for high blood sugar. 02/20/14   Tammy Eckard, PHARMD  insulin glargine (LANTUS) 100 UNIT/ML injection Inject 0.3 mLs (30 Units total) into the skin 2 (two) times daily. 02/24/14   Tammy Eckard, PHARMD  Insulin Pen Needle (B-D ULTRAFINE III SHORT PEN) 31G X 8 MM MISC Use to given insulin daily 02/20/14   Tammy Eckard, PHARMD  meclizine (ANTIVERT) 12.5 MG tablet Take 1 tablet (12.5 mg total) by mouth 3 (three) times daily as needed for dizziness. 03/25/14   Lysbeth Penner, FNP  methylPREDNIsolone (MEDROL DOSPACK) 4 MG tablet follow package directions 03/25/14   Lysbeth Penner, FNP  metoprolol (LOPRESSOR) 50 MG tablet Take 50 mg by mouth 2 (two) times daily. 01/10/14   Chipper Herb, MD  Multiple Vitamin (MULTIVITAMIN WITH MINERALS) TABS tablet Take 1 tablet by mouth 2 (two) times daily.    Historical Provider, MD  olmesartan-hydrochlorothiazide (BENICAR HCT) 40-25 MG per tablet Take 0.5 tablets by mouth daily.     Historical Provider, MD  Pitavastatin Calcium (LIVALO) 4 MG TABS TAKE ONE TABLET BY MOUTH ONE TIME DAILY 02/20/14   Chipper Herb, MD  promethazine (PHENERGAN) 25 MG tablet Take 1 tablet (25 mg total) by mouth every 8 (eight) hours as needed for nausea or vomiting. 03/25/14   Lysbeth Penner, FNP  SANTYL ointment  03/08/14   Historical Provider, MD   BP 149/56  Pulse  58  Temp(Src) 97.5 F (36.4 C) (Oral)  Resp 16  Ht 5' 7.5" (1.715 m)  Wt 205 lb (92.987 kg)  BMI 31.62 kg/m2  SpO2 100% Physical Exam  Nursing note and vitals reviewed. Constitutional: She is oriented to person, place, and time. She appears well-developed and well-nourished. No distress.  HENT:  Head: Normocephalic and atraumatic.  Right Ear: Hearing, tympanic membrane and ear canal normal.  Left Ear: Hearing, tympanic membrane and ear canal normal.  Nose: Mucosal edema present.  Mouth/Throat: Oropharynx is clear and moist.  Mild bilateral maxillary sinus tenderness.  Eyes: Conjunctivae and EOM are normal. Pupils are equal, round, and reactive to light.  Neck: Normal range of motion. Neck supple. No JVD present.  Cardiovascular: Normal rate, regular rhythm, normal heart sounds and intact distal pulses.   No extremity edema.  Pulmonary/Chest: Effort normal and breath sounds normal. No respiratory distress.  Abdominal: Soft. Bowel sounds are normal. There is no tenderness.  Musculoskeletal: Normal range of motion. She exhibits no edema.  Neurological: She is alert and oriented to person, place, and time. She has normal strength. No cranial nerve deficit or sensory deficit. She displays a negative Romberg sign. Coordination and gait normal.  Speech fluent, goal oriented. Moves limbs without ataxia. Equal grip strength bilateral. Dizziness exacerbated on standing.  Skin: Skin is warm and dry. She is not diaphoretic.  Psychiatric: She has a normal mood and affect. Her behavior is normal.    ED Course  Procedures (including critical care time) Labs Review Labs Reviewed  CBC - Abnormal; Notable for the following:    WBC 13.2 (*)    RBC 5.71 (*)    Hemoglobin 16.0 (*)    HCT 47.9 (*)    All other components within normal limits  BASIC METABOLIC PANEL - Abnormal; Notable for the following:    Glucose, Bld 232 (*)    BUN 48 (*)    Creatinine, Ser 1.30 (*)    Calcium 11.2 (*)  GFR calc non Af Amer 43 (*)    GFR calc Af Amer 50 (*)    All other components within normal limits  CBG MONITORING, ED - Abnormal; Notable for the following:    Glucose-Capillary 241 (*)    All other components within normal limits    Imaging Review Ct Head Wo Contrast  03/29/2014   CLINICAL DATA:  Dizziness for 2 weeks, blurred vision, left eye and ear ache  EXAM: CT HEAD WITHOUT CONTRAST  TECHNIQUE: Contiguous axial images were obtained from the base of the skull through the vertex without intravenous contrast.  COMPARISON:  None.  FINDINGS: No acute intracranial hemorrhage. No focal mass lesion. No CT evidence of acute infarction. No midline shift or mass effect. No hydrocephalus. Basilar cisterns are patent. Paranasal sinuses and mastoid air cells are clear.  IMPRESSION: No acute intracranial findings.   Electronically Signed   By: Suzy Bouchard M.D.   On: 03/29/2014 14:09     EKG Interpretation None      MDM   Final diagnoses:  Positional vertigo, unspecified laterality   Pt presenting with positional dizziness, most consistent with peripheral positional vertigo. She is well appearing and in NAD. AFVSS. Dizziness exacerbated on standing during exam. No focal neuro deficits. She is already on prednisone, meclizine and phenergan. Sustained a fall a few days ago due to dizziness. Meclizine helps a little. Given continued symptoms, will obtain CT head and labs. If symptoms continue, pt will most likely need outpatient MRI. Doubt stroke. Pt on prednisone which would treat labyrinthitis. Possible continued sinus congestion.  2:41 PM Labs showing leukocytosis of 13.2 which is most likely from use of prednisone. Kidney functions at baseline. Head CT without any acute findings. Advised patient to continue taking meclizine as prescribed and prednisone until completed. Patient may need outpatient MRI if symptoms persist. Symptoms most consistent with positional vertigo. Stable for discharge.  Followup with PCP. Return precautions given. Patient states understanding of treatment care plan and is agreeable.  Case discussed with attending Dr. Canary Brim who agrees with plan of care.   Illene Labrador, PA-C 03/29/14 1442

## 2014-04-04 DIAGNOSIS — L97509 Non-pressure chronic ulcer of other part of unspecified foot with unspecified severity: Secondary | ICD-10-CM | POA: Diagnosis not present

## 2014-04-04 DIAGNOSIS — E1169 Type 2 diabetes mellitus with other specified complication: Secondary | ICD-10-CM | POA: Diagnosis not present

## 2014-04-07 ENCOUNTER — Telehealth: Payer: Self-pay | Admitting: Family Medicine

## 2014-04-07 NOTE — Telephone Encounter (Signed)
appt scheduled for wed at 4

## 2014-04-09 ENCOUNTER — Encounter: Payer: Self-pay | Admitting: Family Medicine

## 2014-04-09 ENCOUNTER — Ambulatory Visit (INDEPENDENT_AMBULATORY_CARE_PROVIDER_SITE_OTHER): Payer: Commercial Managed Care - HMO | Admitting: Family Medicine

## 2014-04-09 VITALS — BP 110/65 | HR 75 | Temp 98.2°F | Ht 67.5 in | Wt 206.0 lb

## 2014-04-09 DIAGNOSIS — Z9884 Bariatric surgery status: Secondary | ICD-10-CM

## 2014-04-09 DIAGNOSIS — R42 Dizziness and giddiness: Secondary | ICD-10-CM

## 2014-04-09 MED ORDER — PREDNISONE 10 MG PO TABS: ORAL_TABLET | ORAL | Status: DC

## 2014-04-09 MED ORDER — SILDENAFIL CITRATE 100 MG PO TABS: 50.0000 mg | ORAL_TABLET | Freq: Every day | ORAL | Status: DC | PRN

## 2014-04-09 MED ORDER — METHYLPREDNISOLONE ACETATE 80 MG/ML IJ SUSP: 40.0000 mg | Freq: Once | INTRAMUSCULAR | Status: DC

## 2014-04-09 NOTE — Progress Notes (Signed)
Subjective:    Patient ID: [name], female    DOB: Jan 06, 1951, 63 y.o.   MRN: [id number]  HPI Patient here today for hospital follow up. The patient comes to the visit today with her husband appear She went to [facility] ER on 03/29/14 where she was diagnosed with vertigo. The patient had a gastric sleeve bypass surgery in June of this year. The dizziness started a few weeks after this. It is mostly positional in nature with getting out of the bed and with standing. Turning her head does not reproduce the dizziness.        Patient Active Problem List   Diagnosis Date Noted  . 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 Osteopenia 10/09/2013  . Nonhealing nonsurgical wound 09/17/2013  . Abnormal ankle brachial index 09/11/2013  . Hypomagnesemia 08/01/2013  . Morbid obesity 06/26/2013  . DYSPNEA 08/18/2010  . MYOCARDIAL PERFUSION SCAN, WITH STRESS TEST, ABNORMAL 08/04/2010  . ELECTROCARDIOGRAM, ABNORMAL 07/28/2010  . Edema extremities 12/30/2008  . DM 12/26/2008  . DYSLIPIDEMIA 12/26/2008  . HYPERTENSION 12/26/2008  . CAD 12/26/2008  . PERICARDIAL EFFUSION 12/26/2008  . GERD 12/26/2008  . OSTEOARTHRITIS 12/26/2008   Outpatient Encounter Prescriptions as of 04/09/2014  Medication Sig  . ACCU-CHEK AVIVA PLUS test strip CHECK BLOOD SUGAR UP TO TWICE DAILY OR AS PRESCRIBED  . acetaminophen (TYLENOL) 500 MG tablet Take 500 mg by mouth every 6 (six) hours as needed for mild pain.  . B Complex Vitamins (B-COMPLEX/B-12 SL) Place 500 mcg under the tongue every morning.  . B-D INS SYR ULTRAFINE 1CC/31G 31G X 5/16" 1 ML MISC USE TO INJECT INSULIN TWICE A DAY AS INSTRUCTED  . calcium carbonate (OS-CAL) 1250 MG chewable tablet Chew 1 tablet by mouth 3 (three) times daily.  Marland Kitchen doxycycline (VIBRA-TABS) 100 MG tablet   . fenofibrate 54 MG tablet Take 1 tablet (54 mg total) by mouth daily.  . insulin glargine (LANTUS) 100 UNIT/ML injection Inject 34 Units  into the skin 2 (two) times daily.   . Insulin Pen Needle (B-D ULTRAFINE III SHORT PEN) 31G X 8 MM MISC Use to given insulin daily  . meclizine (ANTIVERT) 12.5 MG tablet Take 1 tablet (12.5 mg total) by mouth 3 (three) times daily as needed for dizziness.  . metoprolol (LOPRESSOR) 50 MG tablet Take 50 mg by mouth 2 (two) times daily.  . Multiple Vitamin (MULTIVITAMIN WITH MINERALS) TABS tablet Take 1 tablet by mouth 2 (two) times daily.  Marland Kitchen olmesartan-hydrochlorothiazide (BENICAR HCT) 40-25 MG per tablet Take 0.5 tablets by mouth daily.   Marland Kitchen PATADAY 0.2 % SOLN   . promethazine (PHENERGAN) 25 MG tablet Take 1 tablet (25 mg total) by mouth every 8 (eight) hours as needed for nausea or vomiting.  Marland Kitchen SANTYL ointment   . [DISCONTINUED] esomeprazole (NEXIUM) 40 MG capsule Take 40 mg by mouth daily at 12 noon.  . [DISCONTINUED] insulin aspart (NOVOLOG) 100 UNIT/ML injection Inject 15 Units into the skin daily as needed for high blood sugar.  . [DISCONTINUED] methylPREDNIsolone (MEDROL DOSPACK) 4 MG tablet follow package directions  . [DISCONTINUED] Pitavastatin Calcium (LIVALO) 4 MG TABS TAKE ONE TABLET BY MOUTH ONE TIME DAILY  . [DISCONTINUED] predniSONE (DELTASONE) 10 MG tablet TAPER- 1 tab by mouth four times daily for 2 days, 1 tab by mouth three times daily for 2 days, 1 tab by mouth twice a day for 2 days, then 1 tab by mouth daily for 2 days, then  stop.  . [DISCONTINUED] sildenafil (VIAGRA) 100 MG tablet Take 0.5-1 tablets (50-100 mg total) by mouth daily as needed for erectile dysfunction.  . [DISCONTINUED] methylPREDNISolone acetate (DEPO-MEDROL) injection 40 mg      Review of Systems  Constitutional: Negative.   HENT: Negative.   Eyes: Negative.   Respiratory: Negative.   Cardiovascular: Negative.   Gastrointestinal: Negative.        Dark colored Bowel movements  Endocrine: Negative.   Genitourinary: Negative.   Musculoskeletal: Negative.   Skin: Negative.   Allergic/Immunologic:  Negative.   Neurological: Positive for dizziness and light-headedness.  Hematological: Negative.   Psychiatric/Behavioral: Negative.        Objective:   Physical Exam  Nursing note and vitals reviewed. Constitutional: She is oriented to person, place, and time. She appears well-developed and well-nourished. She appears distressed.  The patient is somewhat anxious with her movements because of the dizziness that results  HENT:  Head: Normocephalic and atraumatic.  Right Ear: External ear normal.  Left Ear: External ear normal.  Nose: Nose normal.  Mouth/Throat: Oropharynx is clear and moist. No oropharyngeal exudate.  Eyes: Conjunctivae and EOM are normal. Pupils are equal, round, and reactive to light. Right eye exhibits no discharge. Left eye exhibits no discharge. No scleral icterus.  Neck: Normal range of motion. Neck supple. No thyromegaly present.  No carotid bruit  Cardiovascular: Normal rate, regular rhythm, normal heart sounds and intact distal pulses.  Exam reveals no gallop and no friction rub.   No murmur heard. At 72 per minute  Pulmonary/Chest: Effort normal and breath sounds normal. No respiratory distress. She has no wheezes. She has no rales. She exhibits no tenderness.  Abdominal: Soft. Bowel sounds are normal. She exhibits no mass. There is no tenderness. There is no rebound and no guarding.  No tenderness with palpation  Musculoskeletal: Normal range of motion. She exhibits no edema and no tenderness.  Lymphadenopathy:    She has no cervical adenopathy.  Neurological: She is alert and oriented to person, place, and time. She has normal reflexes.  Skin: Skin is warm and dry. No rash noted.  Dry skin  Psychiatric: She has a normal mood and affect. Her behavior is normal. Judgment and thought content normal.   BP 110/65  Pulse 75  Temp(Src) 98.2 F (36.8 C) (Oral)  Ht 5' 7.5" (1.715 m)  Wt 206 lb (93.441 kg)  BMI 31.77 kg/m2        Assessment & Plan:    1. Vertigo - POCT CBC - BMP8+EGFR - Ambulatory referral to Neurology  2. Status post gastric bypass for obesity - Fecal occult blood, imunochemical  Patient Instructions  Continue to be careful and do not put yourself at risk for falling Use the walker if necessary especially at nighttime when going to the bathroom Continue current meclizine therapy We will arrange for you to have a visit with the neurologist to evaluate this persistent dizziness Please return the FOBT Continue to drink plenty of fluids   Arrie Senate MD

## 2014-04-09 NOTE — Patient Instructions (Addendum)
Continue to be careful and do not put yourself at risk for falling Use the walker if necessary especially at nighttime when going to the bathroom Continue current meclizine therapy We will arrange for you to have a visit with the neurologist to evaluate this persistent dizziness Please return the FOBT Continue to drink plenty of fluids

## 2014-04-10 ENCOUNTER — Other Ambulatory Visit: Payer: Commercial Managed Care - HMO

## 2014-04-10 ENCOUNTER — Encounter: Payer: Self-pay | Admitting: Gastroenterology

## 2014-04-10 DIAGNOSIS — Z1212 Encounter for screening for malignant neoplasm of rectum: Secondary | ICD-10-CM

## 2014-04-10 LAB — BMP8+EGFR
BUN/Creatinine Ratio: 26 (ref 11–26)
BUN: 36 mg/dL — ABNORMAL HIGH (ref 8–27)
CO2: 29 mmol/L (ref 18–29)
Calcium: 10.1 mg/dL (ref 8.7–10.3)
Chloride: 102 mmol/L (ref 97–108)
Creatinine, Ser: 1.37 mg/dL — ABNORMAL HIGH (ref 0.57–1.00)
GFR calc Af Amer: 47 mL/min/{1.73_m2} — ABNORMAL LOW (ref >59)
GFR calc non Af Amer: 41 mL/min/{1.73_m2} — ABNORMAL LOW (ref >59)
Glucose: 191 mg/dL — ABNORMAL HIGH (ref 65–99)
Potassium: 4.4 mmol/L (ref 3.5–5.2)
Sodium: 146 mmol/L — ABNORMAL HIGH (ref 134–144)

## 2014-04-12 LAB — FECAL OCCULT BLOOD, IMMUNOCHEMICAL: Fecal Occult Bld: NEGATIVE

## 2014-04-14 ENCOUNTER — Ambulatory Visit (INDEPENDENT_AMBULATORY_CARE_PROVIDER_SITE_OTHER): Payer: Commercial Managed Care - HMO | Admitting: Neurology

## 2014-04-14 ENCOUNTER — Encounter: Payer: Self-pay | Admitting: Neurology

## 2014-04-14 VITALS — BP 114/64 | HR 60 | Ht 68.0 in | Wt 207.0 lb

## 2014-04-14 DIAGNOSIS — R42 Dizziness and giddiness: Secondary | ICD-10-CM

## 2014-04-14 NOTE — Progress Notes (Signed)
Reason for visit: Vertigo  [name] is a 63 y.o. female  History of present illness:  [name] is a 63 year old right-handed white female with a history of diabetes, and obesity. The patient recently underwent a gastric sleeve procedure for weight loss. The patient began having vertigo shortly thereafter. The patient initially noted vertigo when getting up out of bed, with a true spinning sensation associated with nausea and vomiting. The patient did not report a headache around that time. She has not had any visual disturbances, loss of vision, double vision, or blurring of vision. She has had some gait instability. The patient will also note that if she rolls from one side to the other in bed, she can also induce vertigo for several minutes. She denies any slurred speech. She has not had any problems with swallowing. She denies blackout episodes. The patient indicates that the dizziness has improved slightly over time, and when she gets up in the morning, and becomes active during the day, the vertigo seems to be less significant. She no longer has vomiting with the dizziness. She may have good days and bad days with the vertigo. She has undergone a CT scan of the brain that was unremarkable. The patient denies any confusion with the headache. She is sent to this office for an evaluation. The patient does report some popping in both ears. She more recently has developed a mild bifrontal headache.  Past Medical History  Diagnosis Date  . 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 Hyperlipidemia   . Cystocele   . IBS (irritable bowel syndrome)   . Varicose veins   . Superficial phlebitis   . Esophageal stricture   . Fatty liver   . Osteopenia   . Vitamin D deficiency   . Dyslipidemia   . GERD (gastroesophageal reflux disease)   . Osteoarthritis   . Pericardial effusion     a. Mod by echo 2012 at time of PNA. b. Echo 07/2012: small pericardial effusion vs fat.    . Hypertension     dr Percival Spanish  . Obesity   . CAD (coronary artery disease)     a. Nonobstructive by cath 2006 - 25% LAD, 25-30% after 1st diag, distal 25% PDA, minor irreg LCx. b. Normal nuc 2011.  Marland Kitchen Hiatal hernia     a. 07/2013: HH with small stricture holding up barium tablet (concides with patient's sx of food sticking).  . 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  . Morbid obesity   . Abnormal Doppler ultrasound of carotid artery     a. 05/2013 - mild plaque, 1-39% BICA, abnormal appearrance of thyroid (TSH wnl in 06/2013).  . Palpitations     a. PVC/PAC on EKG 08/2013.  Marland Kitchen Sleep apnea     CPAP currently broke, calling company about machine, pt does not know cpap settings, uses cpap off and on  . Family history of anesthesia complication     daughter slow to awaken after teeth extraction at age 14  . Toe ulcer 01-08-14    Left big toe-being tx. at wound center "is healing"  . PONV (postoperative nausea and vomiting)     n/v, and room spinning after cataract and parotid surgery  . Difficult intubation 09-07-2012    big neck trouble with intubation parotid surgery"takes little med to sedate"  . Dizziness and giddiness 04/14/2014    Past Surgical History  Procedure  Laterality Date  . Carpal tunnel release  5 yrs ago    Left  . Cataract extraction Bilateral yrs ago  . Cardiac catheterization      6  . Parotidectomy  09/07/2012    Procedure: PAROTIDECTOMY;  Surgeon: Melida Quitter, MD;  Location: [facility];  Service: ENT;  Laterality: Left;  LEFT PAROTIDECTOMY  . Breath tek h pylori N/A 07/29/2013    Procedure: BREATH TEK H PYLORI;  Surgeon: Shann Medal, MD;  Location: [facility];  Service: General;  Laterality: N/A;  . Tubal ligation  yrs ago  . Tonsillectomy  1968  . Cholecystectomy  1982  . Esophagogastroduodenoscopy N/A 12/27/2013    Procedure: ESOPHAGOGASTRODUODENOSCOPY (EGD);  Surgeon: Shann Medal, MD;  Location: [facility];  Service: General;   Laterality: N/A;  . Laparoscopic gastric sleeve resection N/A 01/20/2014    Procedure: LAPAROSCOPIC GASTRIC SLEEVE RESECTION;  Surgeon: Shann Medal, MD;  Location: [facility];  Service: General;  Laterality: N/A;  . Upper gi endoscopy  01/20/2014    Procedure: UPPER GI ENDOSCOPY;  Surgeon: Shann Medal, MD;  Location: [facility];  Service: General;;    Family History  Problem Relation Age of Onset  . Heart failure Mother   . Hypertension Mother   . Skin cancer Mother   . Colitis Mother   . Diabetes Mother   . Heart disease Father   . Colon polyps Father   . Diabetes Father   . Diabetes Other   . Heart attack Sister     Social history:  reports that she has never smoked. She has never used smokeless tobacco. She reports that she does not drink alcohol or use illicit drugs.  Medications:  Current Outpatient Prescriptions on File Prior to Visit  Medication Sig Dispense Refill  . ACCU-CHEK AVIVA PLUS test strip CHECK BLOOD SUGAR UP TO TWICE DAILY OR AS PRESCRIBED  100 each  1  . acetaminophen (TYLENOL) 500 MG tablet Take 500 mg by mouth every 6 (six) hours as needed for mild pain.      . B Complex Vitamins (B-COMPLEX/B-12 SL) Place 500 mcg under the tongue every morning.      . B-D INS SYR ULTRAFINE 1CC/31G 31G X 5/16" 1 ML MISC USE TO INJECT INSULIN TWICE A DAY AS INSTRUCTED  100 each  1  . calcium carbonate (OS-CAL) 1250 MG chewable tablet Chew 1 tablet by mouth 3 (three) times daily.      Marland Kitchen doxycycline (VIBRA-TABS) 100 MG tablet       . fenofibrate 54 MG tablet Take 1 tablet (54 mg total) by mouth daily.  90 tablet  0  . insulin glargine (LANTUS) 100 UNIT/ML injection Inject 34 Units into the skin 2 (two) times daily.   10 mL    . Insulin Pen Needle (B-D ULTRAFINE III SHORT PEN) 31G X 8 MM MISC Use to given insulin daily  100 each  0  . meclizine (ANTIVERT) 12.5 MG tablet Take 1 tablet (12.5 mg total) by mouth 3 (three) times daily as needed for dizziness.  30 tablet  1  . metoprolol  (LOPRESSOR) 50 MG tablet Take 50 mg by mouth 2 (two) times daily.      . Multiple Vitamin (MULTIVITAMIN WITH MINERALS) TABS tablet Take 1 tablet by mouth 2 (two) times daily.      Marland Kitchen olmesartan-hydrochlorothiazide (BENICAR HCT) 40-25 MG per tablet Take 0.5 tablets by mouth daily.       Marland Kitchen PATADAY 0.2 %  SOLN       . promethazine (PHENERGAN) 25 MG tablet Take 1 tablet (25 mg total) by mouth every 8 (eight) hours as needed for nausea or vomiting.  20 tablet  1  . SANTYL ointment       . [DISCONTINUED] Olmesartan-Amlodipine-HCTZ (TRIBENZOR) 40-5-12.5 MG TABS Take 1 tablet by mouth daily.         No current facility-administered medications on file prior to visit.      Allergies  Allergen Reactions  . Ezetimibe-Simvastatin Other (See Comments)    Leg cramps  . Penicillins Other (See Comments)    Fainted  (Tolerated Ancef)  . Celebrex [Celecoxib] Rash  . Levemir [Insulin Detemir] Rash    ROS:  Out of a complete 14 system review of symptoms, the patient complains only of the following symptoms, and all other reviewed systems are negative.  Weight loss Ringing in the ears, stinging sensations Blurred vision, cough Diarrhea Runny nose Headache, dizziness  Blood pressure 114/64, pulse 60, height 5\' 8"  (1.727 m), weight 207 lb (93.895 kg).  Blood pressure, right arm, standing is 132/60. Blood pressure, sitting, right arm is 132/92.  Physical Exam  General: The patient is alert and cooperative at the time of the examination. The patient is markedly obese.  Eyes: Pupils are equal, round, and reactive to light. Discs are flat bilaterally.  Ears: Tympanic membranes are clear bilaterally.  Neck: The neck is supple, no carotid bruits are noted.  Respiratory: The respiratory examination is clear.  Cardiovascular: The cardiovascular examination reveals a regular rate and rhythm, no obvious murmurs or rubs are noted.  Skin: Extremities are without significant edema. The patient has  varicose veins on the right greater than left lower extremity.  Neurologic Exam  Mental status: The patient is alert and oriented x 3 at the time of the examination. The patient has apparent normal recent and remote memory, with an apparently normal attention span and concentration ability.  Cranial nerves: Facial symmetry is present. There is good sensation of the face to pinprick and soft touch bilaterally. The strength of the facial muscles and the muscles to head turning and shoulder shrug are normal bilaterally. Speech is well enunciated, no aphasia or dysarthria is noted. Extraocular movements are full. Visual fields are full. The tongue is midline, and the patient has symmetric elevation of the soft palate. No obvious hearing deficits are noted.  Motor: The motor testing reveals 5 over 5 strength of all 4 extremities. Good symmetric motor tone is noted throughout.  Sensory: Sensory testing is intact to pinprick, soft touch, vibration sensation, and position sense on all 4 extremities, with exception that there is a stocking pattern pinprick sensory deficit up to the knees bilaterally.. No evidence of extinction is noted.  Coordination: Cerebellar testing reveals good finger-nose-finger and heel-to-shin bilaterally. The Nyan-Barrany procedure was negative. The patient did not report vertigo.  Gait and station: Gait is slightly wide-based. Tandem gait is slightly unsteady. Romberg is negative. No drift is seen.  Reflexes: Deep tendon reflexes are symmetric and normal bilaterally, with exception that the ankle jerk reflexes are depressed absent bilaterally. Toes are downgoing bilaterally.   CT head 03/29/14:  IMPRESSION:  No acute intracranial findings.    Assessment/Plan:  1. Probable positional vertigo  2. Diabetes  3. Obesity  The patient has had persistence of symptoms, although the vertigo has improved slightly over time. The patient will be set up for MRI evaluation of the  brain, and she will undergo vestibular  rehabilitation. The patient will followup in 2 or 3 months, but she is to contact our office if the symptoms worsen. The patient has gained some benefit with meclizine, but this will need to be stopped before the vestibular rehabilitation.  Jill Alexanders MD 04/14/2014 7:44 PM  [facility] [address] [place], [place] [address]  Phone [phone] Fax [phone]

## 2014-04-14 NOTE — Patient Instructions (Signed)

## 2014-04-22 ENCOUNTER — Ambulatory Visit: Payer: Medicare HMO | Attending: Neurology | Admitting: Rehabilitative and Restorative Service Providers"

## 2014-04-22 ENCOUNTER — Other Ambulatory Visit: Payer: Self-pay | Admitting: Pediatrics

## 2014-04-22 ENCOUNTER — Telehealth: Payer: Self-pay | Admitting: Neurology

## 2014-04-22 DIAGNOSIS — R269 Unspecified abnormalities of gait and mobility: Secondary | ICD-10-CM | POA: Diagnosis not present

## 2014-04-22 DIAGNOSIS — IMO0001 Reserved for inherently not codable concepts without codable children: Secondary | ICD-10-CM | POA: Insufficient documentation

## 2014-04-22 DIAGNOSIS — R42 Dizziness and giddiness: Secondary | ICD-10-CM | POA: Diagnosis not present

## 2014-04-22 MED ORDER — ALPRAZOLAM 0.5 MG PO TABS: ORAL_TABLET | ORAL | Status: DC

## 2014-04-22 NOTE — Telephone Encounter (Signed)
Patient is due to have an MRI on Saturday at [facility]. She would like something called in to calm her nerves. She uses [facility] in [place].

## 2014-04-22 NOTE — Telephone Encounter (Signed)
Patient is due to have an MRI on Saturday at [facility]. She would like something called in to calm her nerves. She uses [facility] in [place].

## 2014-04-22 NOTE — Telephone Encounter (Signed)
I will call in some low dose alprazolam.

## 2014-04-24 ENCOUNTER — Ambulatory Visit: Payer: Medicare HMO | Admitting: Rehabilitative and Restorative Service Providers"

## 2014-04-24 ENCOUNTER — Encounter: Payer: Self-pay | Admitting: Pharmacist

## 2014-04-24 ENCOUNTER — Ambulatory Visit (INDEPENDENT_AMBULATORY_CARE_PROVIDER_SITE_OTHER): Payer: Commercial Managed Care - HMO | Admitting: Pharmacist

## 2014-04-24 VITALS — BP 122/62 | HR 72 | Ht 68.0 in | Wt 203.5 lb

## 2014-04-24 DIAGNOSIS — E785 Hyperlipidemia, unspecified: Secondary | ICD-10-CM

## 2014-04-24 DIAGNOSIS — E669 Obesity, unspecified: Secondary | ICD-10-CM

## 2014-04-24 DIAGNOSIS — R635 Abnormal weight gain: Secondary | ICD-10-CM

## 2014-04-24 DIAGNOSIS — I10 Essential (primary) hypertension: Secondary | ICD-10-CM

## 2014-04-24 DIAGNOSIS — E66811 Obesity, class 1: Secondary | ICD-10-CM | POA: Insufficient documentation

## 2014-04-24 DIAGNOSIS — E119 Type 2 diabetes mellitus without complications: Secondary | ICD-10-CM

## 2014-04-24 MED ORDER — PITAVASTATIN CALCIUM 4 MG PO TABS: 4.0000 mg | ORAL_TABLET | Freq: Every day | ORAL | Status: DC

## 2014-04-24 NOTE — Progress Notes (Signed)
Diabetes Follow-Up Visit Chief Complaint:   No chief complaint on file.    There were no vitals filed for this visit.   HPI: Patient with type 2 DM treated with insulin.  She had gastric sleeve surgery 01/20/14.  She has had no complications and has lost 29# since 11/11/13.  Currently she is taking Lantus 34 units BID and uses Novolog 22u in BG if elevated but hasn't needed this over the last 2 months.  BG ranges from 70 (surgeon does not want BG less than 100 for the few months after gastric bypass) to 300 (this occurred about 1 month ago when she was on prednisone) but average is 164.  A1c was 7.8% 02/20/2014 which is down from 8.9% from 10/09/13.  Hyperlipidemia - suppose to be taking pitavastatin 4mg  daily but she has stopped because she her insurance will no pay for.  She is taking fenofibrate 54mg  daily.  She is due lab recheck but she is not fasting today. With weight loss and dietary changes fenofibrate may not be necessary in the future.  Diet - she is eating solid foods again - cottage cheese, lean meats.  Also drinking Slim Fast drinks.  She is taking MVI and other recommended vitamins regularly.  Home BG Monitoring:  Checking  1-2 times a day. Average:  164  High: 300 Low:  70  Low fat/carbohydrate diet?  Yes - not eating potatoes, sweets, or corn Nicotine Abuse?  No Medication Compliance?  Yes Exercise?  Less lately due to family illness and vertigo Alcohol Abuse?  No   Exam Edema:  negative Polyuria:  negative  Polydipsia:  negative Polyphagia:  negative  BMI:  There is no weight on file to calculate BMI.   Weight changes:  Decreased 50# since 01/2013 General Appearance:  alert, oriented, no acute distress and obese Mood/Affect:  normal   Lab Results  Component Value Date   HGBA1C 7.8 02/20/2014    No results found for this basenameDerl [name]    Lab Results  Component Value Date   CHOL 213* 10/09/2013   HDL 56 10/09/2013   LDLCALC 105* 10/09/2013   LDLDIRECT 113* 02/26/2013   TRIG 261* 10/09/2013   CHOLHDL 4.8* 06/05/2013      Assessment: 1.  Diabetes.  Improved control 2.  Blood Pressure.  controlled 3.  Lipids.  Labs to be checked next month - last check showed elevated Tg 4.  Obesity - great weight reduction since surgery 5.  Non compliance with statin due to not covered by insurance   Recommendations: 1.  Medication recommendations at this time are as follows:    Change Lantus to 36 units bid  Use Novolog 15 units only when BG over 180  Restart Livalo 4mg  daily - given #21 samples - (recheck lipids next month - then consider change to generic)  Discontinue fenofibrate  With improved BG I think statin should lipids at goal - will restart if Tg elevated in October when recheck. 2.  Reviewed HBG goals:  Fasting 80-130 and 1-2 hour post prandial <180.  Patient is instructed to check BG 3 times per day.    3.  BP goal < 140/85. 4.  LDL goal of < 100, HDL > 40 and TG < 150. 5.  Eye Exam yearly and Dental Exam every 6 months. 6.  Dietary recommendations:  Patient is to continue diet recommended by nutritionist with [facility] 7.  Physical Activity recommendations:  Restart once able after vertigo  resolved/ improved - patient is going to PT for positional vertigo therapy. 8. Return to clinic in 4-6 wks to see PCP and have labs drawn - Follow up with clinical pharmacist in 2 months.   Time spent counseling patient:  30 minutes  Referring provider:  Ermalinda Memos, PharmD, CPP, CDE

## 2014-04-24 NOTE — Patient Instructions (Signed)
Increase Lantus to 36 units twice a day    Diabetes and Standards of Medical Care  Diabetes is complicated. You may find that your diabetes team includes a dietitian, nurse, diabetes educator, eye doctor, and more. To help everyone know what is going on and to help you get the care you deserve, the following schedule of care was developed to help keep you on track. Below are the tests, exams, vaccines, medicines, education, and plans you will need.  Blood Glucose Goals Prior to meals = 80 - 130 Within 2 hours of the start of a meal = less than 180  HbA1c test (goal is less than 7.0% - your last value was 7.8%) This test shows how well you have controlled your glucose over the past 2 3 months. It is used to see if your diabetes management plan needs to be adjusted.   It is performed at least 2 times a year if you are meeting treatment goals.  It is performed 4 times a year if therapy has changed or if you are not meeting treatment goals.   Blood pressure test  This test is performed at every routine medical visit. The goal is less than 140/90 mmHg for most people, but 130/80 mmHg in some cases. Ask your health care provider about your goal. Dental exam  Follow up with the dentist regularly. Eye exam  If you are diagnosed with type 1 diabetes as a child, get an exam upon reaching the age of 12 years or older and have had diabetes for 3 5 years. Yearly eye exams are recommended after that initial eye exam.  If you are diagnosed with type 1 diabetes as an adult, get an exam within 5 years of diagnosis and then yearly.  If you are diagnosed with type 2 diabetes, get an exam as soon as possible after the diagnosis and then yearly. Foot care exam  Visual foot exams are performed at every routine medical visit. The exams check for cuts, injuries, or other problems with the feet.  A comprehensive foot exam should be done yearly. This includes visual inspection as well as assessing foot  pulses and testing for loss of sensation.  Check your feet nightly for cuts, injuries, or other problems with your feet. Tell your health care provider if anything is not healing. Kidney function test (urine microalbumin)  This test is performed once a year.  Type 1 diabetes: The first test is performed 5 years after diagnosis.  Type 2 diabetes: The first test is performed at the time of diagnosis.  A serum creatinine and estimated glomerular filtration rate (eGFR) test is done once a year to assess the level of chronic kidney disease (CKD), if present. Lipid profile (cholesterol, HDL, LDL, triglycerides)  Performed every 5 years for most people.  The goal for LDL is less than 100 mg/dL. If you are at high risk, the goal is less than 70 mg/dL.  The goal for HDL is 40 mg/dL 50 mg/dL for men and 50 mg/dL 60 mg/dL for women. An HDL cholesterol of 60 mg/dL or higher gives some protection against heart disease.  The goal for triglycerides is less than 150 mg/dL. Influenza vaccine, pneumococcal vaccine, and hepatitis B vaccine  The influenza vaccine is recommended yearly.  The pneumococcal vaccine is generally given once in a lifetime. However, there are some instances when another vaccination is recommended. Check with your health care provider.  The hepatitis B vaccine is also recommended for adults with diabetes.  Diabetes self-management education  Education is recommended at diagnosis and ongoing as needed. Treatment plan  Your treatment plan is reviewed at every medical visit. Document Released: 05/29/2009 Document Revised: 04/03/2013 Document Reviewed: 01/01/2013 [facility] Patient Information 2014 [place].

## 2014-04-25 ENCOUNTER — Encounter (HOSPITAL_BASED_OUTPATIENT_CLINIC_OR_DEPARTMENT_OTHER): Payer: Medicare HMO | Attending: General Surgery

## 2014-04-25 DIAGNOSIS — E1169 Type 2 diabetes mellitus with other specified complication: Secondary | ICD-10-CM | POA: Diagnosis not present

## 2014-04-25 DIAGNOSIS — L97509 Non-pressure chronic ulcer of other part of unspecified foot with unspecified severity: Secondary | ICD-10-CM | POA: Diagnosis not present

## 2014-04-26 ENCOUNTER — Ambulatory Visit
Admission: RE | Admit: 2014-04-26 | Discharge: 2014-04-26 | Disposition: A | Discharge status: Home / Self Care | Payer: Medicare HMO | Source: Ambulatory Visit | Attending: Neurology | Admitting: Neurology

## 2014-04-26 DIAGNOSIS — R42 Dizziness and giddiness: Secondary | ICD-10-CM

## 2014-04-28 ENCOUNTER — Ambulatory Visit: Payer: Medicare HMO | Admitting: Rehabilitative and Restorative Service Providers"

## 2014-04-28 ENCOUNTER — Telehealth: Payer: Self-pay | Admitting: Neurology

## 2014-04-28 DIAGNOSIS — IMO0001 Reserved for inherently not codable concepts without codable children: Secondary | ICD-10-CM | POA: Diagnosis not present

## 2014-04-28 NOTE — Telephone Encounter (Signed)
  I called patient. 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 The patient likely has in her ear disease, will be going for vestibular rehabilitation.  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

## 2014-04-29 ENCOUNTER — Encounter: Payer: Medicare HMO | Attending: Surgery | Admitting: Dietician

## 2014-04-29 DIAGNOSIS — Z713 Dietary counseling and surveillance: Secondary | ICD-10-CM | POA: Diagnosis not present

## 2014-04-29 DIAGNOSIS — G4733 Obstructive sleep apnea (adult) (pediatric): Secondary | ICD-10-CM | POA: Diagnosis not present

## 2014-04-29 DIAGNOSIS — E119 Type 2 diabetes mellitus without complications: Secondary | ICD-10-CM | POA: Diagnosis not present

## 2014-04-29 DIAGNOSIS — E669 Obesity, unspecified: Secondary | ICD-10-CM

## 2014-04-29 DIAGNOSIS — K219 Gastro-esophageal reflux disease without esophagitis: Secondary | ICD-10-CM | POA: Diagnosis not present

## 2014-04-29 DIAGNOSIS — E785 Hyperlipidemia, unspecified: Secondary | ICD-10-CM | POA: Diagnosis not present

## 2014-04-29 NOTE — Progress Notes (Signed)
  Follow-up visit:  3 months Post-Operative Gastric sleeve Surgery  Medical Nutrition Therapy:  Appt start time: 1200 end time:  1230.  Primary concerns today: Post-operative Bariatric Surgery Nutrition Management.   [name] returns today having lost 8 pounds. She states she has been struggling with vertigo and fell 1x in the bathroom. Doing some exercises per her physical therapist to help with vertigo and blood flow to her feet. Having full fat cream soups. Noticing that she can tolerate more volume.   Surgery date: 01/20/2014 Surgery type: Gastric sleeve [facility] weight at [facility]: 251 on 10/02/13 (257 lbs per patient) Weight today: 210.5 lbs Weight change: 8 lbs Total weight lost: 46.5 lbs  Weight loss goal: 150-160 lbs  TANITA  BODY COMP RESULTS Patient unable to use Tanita at pre op class  02/04/14 03/18/14 04/29/14   BMI (kg/m^2) 34.7 33 31.7   Fat Mass (lbs) 103 94.5 88.5   Fat Free Mass (lbs) 122 116 114   Total Body Water (lbs) 89.5 85 83.5    Preferred Learning Style:   No preference indicated   Learning Readiness:   Ready  24-hr recall: B (8:30AM): decaf coffee; omelet OR grilled meat Snk (AM): 1/2 protein shake with medicine L (PM): [employer] chili Snk (PM): cheese stick OR sugar free pudding if she wants something sweet  D (PM): bunless cheeseburger, cottage cheese or low fat cheese Snk (PM):   Fluid intake: water with sugar free flavoring, beef bullion, sugar free jello, protein shake (11oz); patient unsure of total amount Estimated total protein intake: 60g+ per day  Medications: on 1/2 benecar; 34 units of Lantus, no Novolog Supplementation: taking  CBG monitoring: 1x in the am Average CBG per patient: 125-155 mg/dL (recently increased Lantus) Last patient reported A1c: none recently planned in October  Using straws: once in a while Drinking while eating: no Hair loss: no Carbonated beverages: no N/V/D/C: nausea in the morning sometimes; constipation   Dumping syndrome: none  Recent physical activity:  None; toe injury and vertigo  Progress Towards Goal(s):  In progress.  Handouts given during visit include:  Phase 3B lean protein + non-starchy vegetables   Nutritional Diagnosis:  [place]-3.3 Overweight/obesity related to past poor dietary habits and physical inactivity as evidenced by patient w/ recent gastric sleeve surgery following dietary guidelines for continued weight loss.     Intervention:  Nutrition counseling provided.  Teaching Method Utilized:  Visual Auditory  Barriers to learning/adherence to lifestyle change: toe injury limiting exercise; vertigo  Demonstrated degree of understanding via:  Teach Back   Monitoring/Evaluation:  Dietary intake, exercise, and body weight. Follow up in 2 months for 5 month post-op visit.

## 2014-04-29 NOTE — Patient Instructions (Signed)
-  Watch portions of nuts! -Have as many non-starchy vegetables as you want  TANITA  BODY COMP RESULTS  02/04/14 03/18/14 04/29/14   BMI (kg/m^2) 34.7 33 31.7   Fat Mass (lbs) 103 94.5 88.5   Fat Free Mass (lbs) 122 116 114   Total Body Water (lbs) 89.5 85 83.5

## 2014-05-02 DIAGNOSIS — E1169 Type 2 diabetes mellitus with other specified complication: Secondary | ICD-10-CM | POA: Diagnosis not present

## 2014-05-02 DIAGNOSIS — L97509 Non-pressure chronic ulcer of other part of unspecified foot with unspecified severity: Secondary | ICD-10-CM | POA: Diagnosis not present

## 2014-05-09 DIAGNOSIS — L97509 Non-pressure chronic ulcer of other part of unspecified foot with unspecified severity: Secondary | ICD-10-CM | POA: Diagnosis not present

## 2014-05-09 DIAGNOSIS — E1169 Type 2 diabetes mellitus with other specified complication: Secondary | ICD-10-CM | POA: Diagnosis not present

## 2014-05-16 ENCOUNTER — Encounter (HOSPITAL_BASED_OUTPATIENT_CLINIC_OR_DEPARTMENT_OTHER): Payer: Commercial Managed Care - HMO | Attending: General Surgery

## 2014-05-16 ENCOUNTER — Other Ambulatory Visit: Payer: Self-pay | Admitting: Family Medicine

## 2014-05-16 ENCOUNTER — Ambulatory Visit: Payer: Commercial Managed Care - HMO | Admitting: Rehabilitative and Restorative Service Providers"

## 2014-05-16 DIAGNOSIS — L97512 Non-pressure chronic ulcer of other part of right foot with fat layer exposed: Secondary | ICD-10-CM | POA: Insufficient documentation

## 2014-05-16 DIAGNOSIS — E11621 Type 2 diabetes mellitus with foot ulcer: Secondary | ICD-10-CM | POA: Insufficient documentation

## 2014-05-19 ENCOUNTER — Telehealth: Payer: Self-pay | Admitting: Family Medicine

## 2014-05-19 ENCOUNTER — Ambulatory Visit
Payer: Commercial Managed Care - HMO | Attending: Neurology | Admitting: Rehabilitative and Restorative Service Providers"

## 2014-05-19 DIAGNOSIS — R269 Unspecified abnormalities of gait and mobility: Secondary | ICD-10-CM | POA: Diagnosis not present

## 2014-05-19 DIAGNOSIS — R42 Dizziness and giddiness: Secondary | ICD-10-CM | POA: Insufficient documentation

## 2014-05-19 DIAGNOSIS — Z139 Encounter for screening, unspecified: Secondary | ICD-10-CM

## 2014-05-19 MED ORDER — PITAVASTATIN CALCIUM 4 MG PO TABS: 4.0000 mg | ORAL_TABLET | Freq: Every day | ORAL | Status: DC

## 2014-05-19 NOTE — Telephone Encounter (Signed)
Patient asks about

## 2014-05-19 NOTE — Telephone Encounter (Signed)
Tried to call - no ans, LMOVM.

## 2014-05-19 NOTE — Telephone Encounter (Signed)
Patient requested samples of Livalo - left at front desk.  She also states that she would like to restart metformin because she has reached a plateau with weight loss and feels that insulin is worsening efforts.  I agree that restarting metformin is a good idea but she is still going to need some insulin.  Restart metformin 500mg  1 tablet bid (or 1/2 tablet of 1000mg  that she has on hand)  Decrease lantus to 20 units BID.  Monitor of s/s of hypo or hyperglycemia. Patient instructed to call office if BG is greater than 200 or less than 70.

## 2014-05-19 NOTE — Telephone Encounter (Signed)
This is okay to do this referral for the GYN visit and a mammogram

## 2014-05-19 NOTE — Telephone Encounter (Signed)
Stephannie is returning Tammy's call.  Call her at [phone]-home #

## 2014-05-20 NOTE — Telephone Encounter (Signed)
Referrals placed- pt aware

## 2014-05-23 DIAGNOSIS — L97512 Non-pressure chronic ulcer of other part of right foot with fat layer exposed: Secondary | ICD-10-CM | POA: Diagnosis not present

## 2014-05-23 DIAGNOSIS — E11621 Type 2 diabetes mellitus with foot ulcer: Secondary | ICD-10-CM | POA: Diagnosis present

## 2014-05-24 ENCOUNTER — Other Ambulatory Visit: Payer: Self-pay | Admitting: Pharmacist

## 2014-05-26 ENCOUNTER — Telehealth: Payer: Self-pay | Admitting: Neurology

## 2014-05-26 NOTE — Telephone Encounter (Signed)
Left message to see if she can come in sooner for an appointment.  Asked her to call the office.

## 2014-05-30 DIAGNOSIS — E11621 Type 2 diabetes mellitus with foot ulcer: Secondary | ICD-10-CM | POA: Diagnosis not present

## 2014-05-30 DIAGNOSIS — L97512 Non-pressure chronic ulcer of other part of right foot with fat layer exposed: Secondary | ICD-10-CM | POA: Diagnosis not present

## 2014-05-30 NOTE — Telephone Encounter (Signed)
Appointment scheduled for 06-04-14, patient aware.

## 2014-05-30 NOTE — Telephone Encounter (Signed)
Patient returning call to Butch Penny, please call and advise.

## 2014-06-03 ENCOUNTER — Ambulatory Visit (INDEPENDENT_AMBULATORY_CARE_PROVIDER_SITE_OTHER): Payer: Commercial Managed Care - HMO | Admitting: Family Medicine

## 2014-06-03 ENCOUNTER — Encounter: Payer: Self-pay | Admitting: Family Medicine

## 2014-06-03 VITALS — BP 117/65 | HR 56 | Temp 96.3°F | Ht 68.0 in | Wt 199.0 lb

## 2014-06-03 DIAGNOSIS — E1122 Type 2 diabetes mellitus with diabetic chronic kidney disease: Secondary | ICD-10-CM

## 2014-06-03 DIAGNOSIS — I10 Essential (primary) hypertension: Secondary | ICD-10-CM

## 2014-06-03 DIAGNOSIS — I739 Peripheral vascular disease, unspecified: Secondary | ICD-10-CM

## 2014-06-03 DIAGNOSIS — L97502 Non-pressure chronic ulcer of other part of unspecified foot with fat layer exposed: Secondary | ICD-10-CM

## 2014-06-03 DIAGNOSIS — E118 Type 2 diabetes mellitus with unspecified complications: Secondary | ICD-10-CM

## 2014-06-03 DIAGNOSIS — E785 Hyperlipidemia, unspecified: Secondary | ICD-10-CM

## 2014-06-03 DIAGNOSIS — N183 Chronic kidney disease, stage 3 unspecified: Secondary | ICD-10-CM

## 2014-06-03 DIAGNOSIS — N189 Chronic kidney disease, unspecified: Secondary | ICD-10-CM

## 2014-06-03 DIAGNOSIS — E1129 Type 2 diabetes mellitus with other diabetic kidney complication: Secondary | ICD-10-CM | POA: Insufficient documentation

## 2014-06-03 DIAGNOSIS — E559 Vitamin D deficiency, unspecified: Secondary | ICD-10-CM

## 2014-06-03 DIAGNOSIS — Z23 Encounter for immunization: Secondary | ICD-10-CM

## 2014-06-03 DIAGNOSIS — E1159 Type 2 diabetes mellitus with other circulatory complications: Secondary | ICD-10-CM

## 2014-06-03 DIAGNOSIS — E1151 Type 2 diabetes mellitus with diabetic peripheral angiopathy without gangrene: Secondary | ICD-10-CM | POA: Insufficient documentation

## 2014-06-03 DIAGNOSIS — Z9884 Bariatric surgery status: Secondary | ICD-10-CM

## 2014-06-03 LAB — POCT CBC
Granulocyte percent: 60.5 %G (ref 37–80)
HCT, POC: 41.4 % (ref 37.7–47.9)
Hemoglobin: 13.3 g/dL (ref 12.2–16.2)
Lymph, poc: 3.2 (ref 0.6–3.4)
MCH, POC: 27.4 pg (ref 27–31.2)
MCHC: 32.1 g/dL (ref 31.8–35.4)
MCV: 85.4 fL (ref 80–97)
MPV: 9 fL (ref 0–99.8)
POC Granulocyte: 5.4 (ref 2–6.9)
POC LYMPH PERCENT: 36.5 %L (ref 10–50)
Platelet Count, POC: 214 10*3/uL (ref 142–424)
RBC: 4.9 M/uL (ref 4.04–5.48)
RDW, POC: 13.4 %
WBC: 8.9 10*3/uL (ref 4.6–10.2)

## 2014-06-03 LAB — POCT GLYCOSYLATED HEMOGLOBIN (HGB A1C): Hemoglobin A1C: 8.5

## 2014-06-03 MED ORDER — METOPROLOL TARTRATE 50 MG PO TABS: ORAL_TABLET | ORAL | Status: DC

## 2014-06-03 MED ORDER — OLMESARTAN MEDOXOMIL-HCTZ 40-25 MG PO TABS: 0.5000 | ORAL_TABLET | Freq: Every day | ORAL | Status: DC

## 2014-06-03 NOTE — Progress Notes (Signed)
Subjective:    Patient ID: [name], female    DOB: 1951-04-12, 63 y.o.   MRN: [id number]  HPI Pt here for follow up and management of chronic medical problems. The patient is doing well following her sleeve gastrectomy. She is still taking a lot of medications and hopes to get these reduced in the future. She'll do lab work today and a flu shot today. She is also due to get her mammogram. The patient is doing well she has lost from 257 down to 199. She also needs to get some diabetic shoes. She indicates that her home blood sugars have been running between 128 and 178 fasting. She saw the vascular surgeon recently and he indicates that he will just to continue with conservative treatment for her the lack of circulation to her feet and the ulcer she has between the great toe and second toe on the left foot. She complains of some sinus problems and bleeding problems. Please see outside blood pressures which will be scanned into the record.        Patient Active Problem List   Diagnosis Date Noted  . Obesity (BMI 30.0-34.9) 04/24/2014  . Dizziness and giddiness 04/14/2014  . 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 Osteopenia 10/09/2013  . Nonhealing nonsurgical wound 09/17/2013  . Abnormal ankle brachial index 09/11/2013  . Hypomagnesemia 08/01/2013  . DYSPNEA 08/18/2010  . MYOCARDIAL PERFUSION SCAN, WITH STRESS TEST, ABNORMAL 08/04/2010  . ELECTROCARDIOGRAM, ABNORMAL 07/28/2010  . Edema extremities 12/30/2008  . Diabetes 12/26/2008  . Hyperlipidemia 12/26/2008  . Benign essential HTN 12/26/2008  . CAD 12/26/2008  . PERICARDIAL EFFUSION 12/26/2008  . GERD 12/26/2008  . OSTEOARTHRITIS 12/26/2008   Outpatient Encounter Prescriptions as of 06/03/2014  Medication Sig  . ACCU-CHEK AVIVA PLUS test strip CHECK BLOOD SUGAR UP TO TWICE DAILY OR AS PRESCRIBED  . acetaminophen (TYLENOL) 500 MG tablet Take 500 mg by mouth every 6 (six) hours as needed for mild pain.  .  B Complex Vitamins (B-COMPLEX/B-12 SL) Place 500 mcg under the tongue every morning.  . B-D INS SYR ULTRAFINE 1CC/31G 31G X 5/16" 1 ML MISC USE TO INJECT INSULIN TWICE A DAY AS INSTRUCTED  . calcium carbonate (OS-CAL) 1250 MG chewable tablet Chew 1 tablet by mouth 3 (three) times daily.  . insulin glargine (LANTUS) 100 UNIT/ML injection Inject 36 Units into the skin 2 (two) times daily.   . Insulin Pen Needle (B-D ULTRAFINE III SHORT PEN) 31G X 8 MM MISC Use to given insulin daily  . metoprolol (LOPRESSOR) 50 MG tablet TAKE 1 tABLET BY MOUTH TWICE DAILY  . Multiple Vitamin (MULTIVITAMIN WITH MINERALS) TABS tablet Take 1 tablet by mouth 2 (two) times daily.  Marland Kitchen olmesartan-hydrochlorothiazide (BENICAR HCT) 40-25 MG per tablet Take 0.5 tablets by mouth daily.   . Pitavastatin Calcium 4 MG TABS Take 1 tablet (4 mg total) by mouth daily.  . [DISCONTINUED] ALPRAZolam (XANAX) 0.5 MG tablet Take 2 tablets 30-40 minutes prior to MRI scan, take a third tablet if needed. Do not operate a motor vehicle if this medication is taken.  . [DISCONTINUED] meclizine (ANTIVERT) 12.5 MG tablet Take 1 tablet (12.5 mg total) by mouth 3 (three) times daily as needed for dizziness.  . [DISCONTINUED] metoprolol (LOPRESSOR) 50 MG tablet TAKE 1 & 1/2 TABLETS BY MOUTH TWICE DAILY  . [DISCONTINUED] PATADAY 0.2 % SOLN   . SANTYL ointment   . [DISCONTINUED] fenofibrate 54 MG tablet TAKE ONE TABLET BY MOUTH ONE  TIME DAILY  . [DISCONTINUED] metFORMIN (GLUCOPHAGE) 1000 MG tablet Take 0.5 tablets (500 mg total) by mouth 2 (two) times daily with a meal.  . [DISCONTINUED] promethazine (PHENERGAN) 25 MG tablet Take 1 tablet (25 mg total) by mouth every 8 (eight) hours as needed for nausea or vomiting.    Review of Systems  Constitutional: Negative.   HENT: Positive for ear pain (bilateral ear popping).   Eyes: Negative.   Respiratory: Negative.   Cardiovascular: Negative.   Gastrointestinal: Negative.   Endocrine: Negative.     Genitourinary: Negative.   Musculoskeletal: Negative.   Skin: Negative.   Allergic/Immunologic: Negative.   Neurological: Negative.   Hematological: Negative.   Psychiatric/Behavioral: Negative.        Objective:   Physical Exam  Nursing note and vitals reviewed. Constitutional: She is oriented to person, place, and time. She appears well-developed and well-nourished. No distress.  The patient remains overweight with truncal obesity  HENT:  Head: Normocephalic and atraumatic.  Right Ear: External ear normal.  Left Ear: External ear normal.  Nose: Nose normal.  Mouth/Throat: Oropharynx is clear and moist.  Nasal congestion bilaterally  Eyes: Conjunctivae and EOM are normal. Pupils are equal, round, and reactive to light. Right eye exhibits no discharge. Left eye exhibits no discharge. No scleral icterus.  Neck: Normal range of motion. Neck supple. No JVD present. No thyromegaly present.  No carotid bruits. No cervical adenopathy  Cardiovascular: Normal rate, regular rhythm, normal heart sounds and intact distal pulses.  Exam reveals no gallop and no friction rub.   No murmur heard. At 72 per minute  Pulmonary/Chest: Effort normal and breath sounds normal. No respiratory distress. She has no wheezes. She has no rales. She exhibits no tenderness.  Abdominal: Soft. Bowel sounds are normal. She exhibits no mass. There is no tenderness. There is no rebound and no guarding.  There is still truncal obesity.  Musculoskeletal: Normal range of motion. She exhibits no edema and no tenderness.  Lymphadenopathy:    She has no cervical adenopathy.  Neurological: She is alert and oriented to person, place, and time. She has normal reflexes. No cranial nerve deficit.  Skin: Skin is warm and dry. No rash noted. No erythema. No pallor.  Dry skin. The patient has a callus on the bottom of the right great. There is an ulcer on the lateral aspect of the left great adjacent to her second toe. There is  diminished circulation to both lower extremities.  Psychiatric: She has a normal mood and affect. Her behavior is normal. Judgment and thought content normal.   BP 117/65  Pulse 56  Temp(Src) 96.3 F (35.7 C) (Oral)  Ht 5' 8"  (1.727 m)  Wt 199 lb (90.266 kg)  BMI 30.26 kg/m2        Assessment & Plan:  1. Type 2 diabetes mellitus with complication - POCT CBC - POCT glycosylated hemoglobin (Hb A1C)  2. Hyperlipidemia - POCT CBC - NMR, lipoprofile  3. Benign essential HTN - POCT CBC - BMP8+EGFR - Hepatic function panel  4. Status post bariatric surgery - POCT CBC - POCT glycosylated hemoglobin (Hb A1C) - BMP8+EGFR - Hepatic function panel - NMR, lipoprofile - Vit D  25 hydroxy (rtn osteoporosis monitoring) - Vitamin B12 - Vitamin B1 - Magnesium  5. Vitamin D deficiency - Vit D  25 hydroxy (rtn osteoporosis monitoring)  6. CKD stage 3 due to type 2 diabetes mellitus  7. Type 2 diabetes mellitus with diabetic chronic kidney disease  8. Type II diabetes mellitus with peripheral circulatory disorder  9. Peripheral vascular insufficiency -Continue followup with vascular surgeon  10. Toe ulcer, unspecified laterality, with fat layer exposed -Continue to be followed by the wound center and vascular surgeon  Meds ordered this encounter  Medications  . DISCONTD: metoprolol (LOPRESSOR) 50 MG tablet    Sig: TAKE 1 tABLET BY MOUTH TWICE DAILY  . olmesartan-hydrochlorothiazide (BENICAR HCT) 40-25 MG per tablet    Sig: Take 0.5 tablets by mouth daily.    Dispense:  30 tablet    Refill:  3  . metoprolol (LOPRESSOR) 50 MG tablet    Sig: TAKE 1 tABLET BY MOUTH TWICE DAILY    Dispense:  60 tablet    Refill:  3   Patient Instructions      Continue current medications. Continue good therapeutic lifestyle changes which include good diet and exercise. Fall precautions discussed with patient. If an FOBT was given today- please return it to our front desk. If you are  over 51 years old - you may need Prevnar 41 or the adult Pneumonia vaccine.  Flu Shots will be available at our office starting mid- September. Please call and schedule a FLU CLINIC APPOINTMENT.   Discuss with the general surgeon about being able to start back on a coated baby aspirin Use ayr nasal spray frequently through the day in each nostril Continue going to the wound Center regarding the ulcer on her left foot Monitored feet daily and continue to monitor her blood sugar regularly Exercise and continue to follow diet   Arrie Senate MD

## 2014-06-03 NOTE — Patient Instructions (Addendum)
Continue current medications. Continue good therapeutic lifestyle changes which include good diet and exercise. Fall precautions discussed with patient. If an FOBT was given today- please return it to our front desk. If you are over 63 years old - you may need Prevnar 29 or the adult Pneumonia vaccine.  Flu Shots will be available at our office starting mid- September. Please call and schedule a FLU CLINIC APPOINTMENT.   Discuss with the general surgeon about being able to start back on a coated baby aspirin Use ayr nasal spray frequently through the day in each nostril Continue going to the wound Center regarding the ulcer on her left foot Monitored feet daily and continue to monitor her blood sugar regularly Exercise and continue to follow diet

## 2014-06-04 ENCOUNTER — Encounter: Payer: Self-pay | Admitting: Neurology

## 2014-06-04 ENCOUNTER — Ambulatory Visit (INDEPENDENT_AMBULATORY_CARE_PROVIDER_SITE_OTHER): Payer: Commercial Managed Care - HMO | Admitting: Neurology

## 2014-06-04 VITALS — BP 107/61 | HR 55 | Wt 200.6 lb

## 2014-06-04 DIAGNOSIS — R42 Dizziness and giddiness: Secondary | ICD-10-CM

## 2014-06-04 LAB — HEPATIC FUNCTION PANEL
ALT: 15 IU/L (ref 0–32)
AST: 10 IU/L (ref 0–40)
Albumin: 4.1 g/dL (ref 3.6–4.8)
Alkaline Phosphatase: 67 IU/L (ref 39–117)
Bilirubin, Direct: 0.14 mg/dL (ref 0.00–0.40)
Total Bilirubin: 0.6 mg/dL (ref 0.0–1.2)
Total Protein: 6.3 g/dL (ref 6.0–8.5)

## 2014-06-04 LAB — NMR, LIPOPROFILE
Cholesterol: 184 mg/dL (ref 100–199)
HDL Cholesterol by NMR: 43 mg/dL (ref >39)
HDL Particle Number: 28 umol/L — ABNORMAL LOW (ref >=30.5)
LDL Particle Number: 1374 nmol/L — ABNORMAL HIGH (ref <1000)
LDL Size: 21.5 nm (ref >20.5)
LDLC SERPL CALC-MCNC: 101 mg/dL — ABNORMAL HIGH (ref 0–99)
LP-IR Score: 66 — ABNORMAL HIGH (ref <=45)
Small LDL Particle Number: 299 nmol/L (ref <=527)
Triglycerides by NMR: 200 mg/dL — ABNORMAL HIGH (ref 0–149)

## 2014-06-04 LAB — BMP8+EGFR
BUN/Creatinine Ratio: 38 — ABNORMAL HIGH (ref 11–26)
BUN: 45 mg/dL — ABNORMAL HIGH (ref 8–27)
CO2: 25 mmol/L (ref 18–29)
Calcium: 9.7 mg/dL (ref 8.7–10.3)
Chloride: 103 mmol/L (ref 97–108)
Creatinine, Ser: 1.19 mg/dL — ABNORMAL HIGH (ref 0.57–1.00)
GFR calc Af Amer: 56 mL/min/{1.73_m2} — ABNORMAL LOW (ref >59)
GFR calc non Af Amer: 49 mL/min/{1.73_m2} — ABNORMAL LOW (ref >59)
Glucose: 152 mg/dL — ABNORMAL HIGH (ref 65–99)
Potassium: 4.4 mmol/L (ref 3.5–5.2)
Sodium: 143 mmol/L (ref 134–144)

## 2014-06-04 LAB — MAGNESIUM: Magnesium: 1.8 mg/dL (ref 1.6–2.6)

## 2014-06-04 LAB — VITAMIN B12: Vitamin B-12: 1004 pg/mL — ABNORMAL HIGH (ref 211–946)

## 2014-06-04 LAB — VITAMIN D 25 HYDROXY (VIT D DEFICIENCY, FRACTURES): Vit D, 25-Hydroxy: 44.6 ng/mL (ref 30.0–100.0)

## 2014-06-04 NOTE — Patient Instructions (Signed)

## 2014-06-04 NOTE — Progress Notes (Signed)
Reason for visit: Vertigo  [name] is an 63 y.o. female  History of present illness:  [name] is a 63 year old right-handed white female with history of diabetes who has complained of onset of vertigo. 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 Otherwise, no evidence of significant cerebrovascular disease was noted. The patient has undergone vestibular rehabilitation, and she has done well with this. She has not had any further episodes of vertigo. The patient indicates that she does have some numbness in the feet, and prior examinations have shown a stocking pattern pinprick sensory deficit up to the knees. She denies any falls. She also reports some mild problems in memory with difficulty remembering names for people. She reports dryness of the eyes. She comes to the office today for reevaluation. She recently has had some blood work done to check a vitamin B12 level. The results of this are not yet available.   Past Medical History  Diagnosis Date  . 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 Hyperlipidemia   . Cystocele   . IBS (irritable bowel syndrome)   . Varicose veins   . Superficial phlebitis   . Esophageal stricture   . Fatty liver   . Osteopenia   . Vitamin D deficiency   . Dyslipidemia   . GERD (gastroesophageal reflux disease)   . Osteoarthritis   . Pericardial effusion     a. Mod by echo 2012 at time of PNA. b. Echo 07/2012: small pericardial effusion vs fat.  . Hypertension     dr Percival Spanish  . Obesity   . CAD (coronary artery disease)     a. Nonobstructive by cath 2006 - 25% LAD, 25-30% after 1st diag, distal 25% PDA, minor irreg LCx. b. Normal nuc 2011.  Marland Kitchen Hiatal hernia     a. 07/2013: HH with small stricture holding up barium tablet (concides with patient's sx of food sticking).  . 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  . Morbid obesity   . Abnormal Doppler  ultrasound of carotid artery     a. 05/2013 - mild plaque, 1-39% BICA, abnormal appearrance of thyroid (TSH wnl in 06/2013).  . Palpitations     a. PVC/PAC on EKG 08/2013.  Marland Kitchen Sleep apnea     CPAP currently broke, calling company about machine, pt does not know cpap settings, uses cpap off and on  . Family history of anesthesia complication     daughter slow to awaken after teeth extraction at age 68  . Toe ulcer 01-08-14    Left big toe-being tx. at wound center "is healing"  . PONV (postoperative nausea and vomiting)     n/v, and room spinning after cataract and parotid surgery  . Difficult intubation 09-07-2012    big neck trouble with intubation parotid surgery"takes little med to sedate"  . Dizziness and giddiness 04/14/2014    Past Surgical History  Procedure Laterality Date  . Carpal tunnel release  5 yrs ago    Left  . Cataract extraction Bilateral yrs ago  . Cardiac catheterization      06  . Parotidectomy  09/07/2012    Procedure: PAROTIDECTOMY;  Surgeon: Melida Quitter, MD;  Location: [facility];  Service: ENT;  Laterality: Left;  LEFT PAROTIDECTOMY  . Breath tek h pylori N/A 07/29/2013    Procedure: BREATH TEK H PYLORI;  Surgeon: Shanon Brow  Bary Leriche, MD;  Location: [facility];  Service: General;  Laterality: N/A;  . Tubal ligation  yrs ago  . Tonsillectomy  1968  . Cholecystectomy  1982  . Esophagogastroduodenoscopy N/A 12/27/2013    Procedure: ESOPHAGOGASTRODUODENOSCOPY (EGD);  Surgeon: Shann Medal, MD;  Location: [facility];  Service: General;  Laterality: N/A;  . Laparoscopic gastric sleeve resection N/A 01/20/2014    Procedure: LAPAROSCOPIC GASTRIC SLEEVE RESECTION;  Surgeon: Shann Medal, MD;  Location: [facility];  Service: General;  Laterality: N/A;  . Upper gi endoscopy  01/20/2014    Procedure: UPPER GI ENDOSCOPY;  Surgeon: Shann Medal, MD;  Location: [facility];  Service: General;;    Family History  Problem Relation Age of Onset  . Heart failure Mother   . Hypertension  Mother   . Skin cancer Mother   . Colitis Mother   . Diabetes Mother   . Heart disease Father   . Colon polyps Father   . Diabetes Father   . Diabetes Other   . Heart attack Sister     Social history:  reports that she has never smoked. She has never used smokeless tobacco. She reports that she does not drink alcohol or use illicit drugs.    Allergies  Allergen Reactions  . Ezetimibe-Simvastatin Other (See Comments)    Leg cramps  . Penicillins Other (See Comments)    Fainted  (Tolerated Ancef)  . Celebrex [Celecoxib] Rash  . Levemir [Insulin Detemir] Rash    Medications:  Current Outpatient Prescriptions on File Prior to Visit  Medication Sig Dispense Refill  . ACCU-CHEK AVIVA PLUS test strip CHECK BLOOD SUGAR UP TO TWICE DAILY OR AS PRESCRIBED  100 each  1  . B Complex Vitamins (B-COMPLEX/B-12 SL) Place 500 mcg under the tongue every morning.      . B-D INS SYR ULTRAFINE 1CC/31G 31G X 5/16" 1 ML MISC USE TO INJECT INSULIN TWICE A DAY AS INSTRUCTED  100 each  1  . calcium carbonate (OS-CAL) 1250 MG chewable tablet Chew 1 tablet by mouth 3 (three) times daily.      . insulin glargine (LANTUS) 100 UNIT/ML injection Inject 36 Units into the skin 2 (two) times daily.   20 mL    . Insulin Pen Needle (B-D ULTRAFINE III SHORT PEN) 31G X 8 MM MISC Use to given insulin daily  100 each  0  . metoprolol (LOPRESSOR) 50 MG tablet TAKE 1 tABLET BY MOUTH TWICE DAILY  60 tablet  3  . Multiple Vitamin (MULTIVITAMIN WITH MINERALS) TABS tablet Take 1 tablet by mouth 2 (two) times daily.      Marland Kitchen olmesartan-hydrochlorothiazide (BENICAR HCT) 40-25 MG per tablet Take 0.5 tablets by mouth daily.  30 tablet  3  . Pitavastatin Calcium 4 MG TABS Take 1 tablet (4 mg total) by mouth daily.  28 tablet  0  . [DISCONTINUED] Olmesartan-Amlodipine-HCTZ (TRIBENZOR) 40-5-12.5 MG TABS Take 1 tablet by mouth daily.         No current facility-administered medications on file prior to visit.    ROS:  Out of a  complete 14 system review of symptoms, the patient complains only of the following symptoms, and all other reviewed systems are negative.  Hearing loss, ringing in the ears, runny nose Blurred vision Incontinence of the bladder Joint pains, back pains, neck stiffness Moles Memory loss, numbness  Blood pressure 107/61, pulse 55, weight 200 lb 9.6 oz (90.992 kg).  Physical Exam  General: The patient  is alert and cooperative at the time of the examination. The patient is markedly obese.  Skin: No significant peripheral edema is noted.   Neurologic Exam  Mental status: The patient is oriented x 3.  Cranial nerves: Facial symmetry is present. Speech is normal, no aphasia or dysarthria is noted. Extraocular movements are full. Visual fields are full.  Motor: The patient has good strength in all 4 extremities.  Sensory examination: Soft touch sensation is symmetric on the face, arms, and legs.  Coordination: The patient has good finger-nose-finger and heel-to-shin bilaterally.  Gait and station: The patient has a normal gait. Tandem gait is very minimally unsteady. Romberg is negative. No drift is seen.  Reflexes: Deep tendon reflexes are symmetric, but are depressed.   Assessment/Plan:  1. Vertigo, likely positional vertigo  2. Diabetes  3. 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 She will contact our office if she believes that her memory issues are worsening over time, or that her balance is changing. The patient has been given a handout from her vestibular rehabilitation for the Epley maneuvers that she can engage in if the vertigo returns. Otherwise, the patient will followup through this office if needed. Given the reported dryness of the eyes, checking for Sjogren syndrome in the future may be indicated.  Jill Alexanders MD 06/04/2014 7:59 PM  [facility] [address] [place],  [place] [address]  Phone [phone] Fax [phone]

## 2014-06-13 ENCOUNTER — Telehealth: Payer: Self-pay | Admitting: Family Medicine

## 2014-06-13 DIAGNOSIS — L97512 Non-pressure chronic ulcer of other part of right foot with fat layer exposed: Secondary | ICD-10-CM | POA: Diagnosis not present

## 2014-06-13 DIAGNOSIS — E11621 Type 2 diabetes mellitus with foot ulcer: Secondary | ICD-10-CM | POA: Diagnosis not present

## 2014-06-16 NOTE — Telephone Encounter (Signed)
Please review

## 2014-06-18 NOTE — Telephone Encounter (Signed)
Done and refaxed to [facility]

## 2014-06-19 ENCOUNTER — Ambulatory Visit: Payer: Commercial Managed Care - HMO

## 2014-06-27 ENCOUNTER — Encounter (HOSPITAL_BASED_OUTPATIENT_CLINIC_OR_DEPARTMENT_OTHER): Payer: Commercial Managed Care - HMO | Attending: General Surgery

## 2014-06-27 ENCOUNTER — Telehealth: Payer: Self-pay | Admitting: Family Medicine

## 2014-06-27 DIAGNOSIS — E11621 Type 2 diabetes mellitus with foot ulcer: Secondary | ICD-10-CM | POA: Insufficient documentation

## 2014-06-27 DIAGNOSIS — L97529 Non-pressure chronic ulcer of other part of left foot with unspecified severity: Secondary | ICD-10-CM | POA: Diagnosis not present

## 2014-06-30 ENCOUNTER — Ambulatory Visit (INDEPENDENT_AMBULATORY_CARE_PROVIDER_SITE_OTHER): Payer: Commercial Managed Care - HMO | Admitting: Pharmacist

## 2014-06-30 ENCOUNTER — Encounter: Payer: Self-pay | Admitting: Pharmacist

## 2014-06-30 VITALS — BP 110/60 | HR 65 | Ht 68.0 in | Wt 197.5 lb

## 2014-06-30 DIAGNOSIS — E669 Obesity, unspecified: Secondary | ICD-10-CM

## 2014-06-30 DIAGNOSIS — E119 Type 2 diabetes mellitus without complications: Secondary | ICD-10-CM

## 2014-06-30 DIAGNOSIS — E785 Hyperlipidemia, unspecified: Secondary | ICD-10-CM

## 2014-06-30 DIAGNOSIS — Z794 Long term (current) use of insulin: Secondary | ICD-10-CM

## 2014-06-30 DIAGNOSIS — Z9884 Bariatric surgery status: Secondary | ICD-10-CM

## 2014-06-30 MED ORDER — SITAGLIPTIN PHOSPHATE 100 MG PO TABS: 50.0000 mg | ORAL_TABLET | Freq: Every day | ORAL | Status: DC

## 2014-06-30 NOTE — Progress Notes (Signed)
Diabetes Follow-Up Visit Chief Complaint:   Chief Complaint  Patient presents with  . Diabetes     There were no vitals filed for this visit.   HPI: Patient with type 2 DM treated with insulin.  She had gastric sleeve surgery 01/20/14.  She has had no complications and has lost 53# since 11/11/13.  Currently she is taking Lantus 34 units BID and uses Novolog 22u in BG if elevated but hasn't needed this over the last 2 months.  BG ranges from 101 (surgeon does not want BG less than 100 for the few months after gastric bypass) to 265,   7 day avg = 135 14 day avg = 148 30 day avg = 149 RBG in office today was 272  A1c was 8.5% 06/03/2014 which is up compared to 3 months ago  Hyperlipidemia - taking pitavastatin 4mg  daily. Tricor was discontinued about 2 months ago.  Last labs showed improved Triglycerides but not at goal.  LDL was elevated but she was not taking livalo / pitavastatin at that time.   Diet - she is eating solid foods again - cottage cheese, lean meats.  Also drinking Slim Fast drinks.  She is taking MVI and other recommended vitamins regularly.  Low fat/carbohydrate diet?  Yes - not eating bread, potatoes, sweets, or corn Nicotine Abuse?  No Medication Compliance?  Yes Exercise?  Less lately due to family illness Alcohol Abuse?  No   Exam Edema:  negative Polyuria:  negative  Polydipsia:  negative Polyphagia:  negative  BMI:  Body mass index is 30.04 kg/(m^2).   Weight changes:  Decreased 58# since 01/2013 General Appearance:  alert, oriented, no acute distress and obese Mood/Affect:  normal   Lab Results  Component Value Date   HGBA1C 8.5 06/03/2014    No results found for: [facility]  Lab Results  Component Value Date   CHOL 184 06/03/2014   HDL 43 06/03/2014   LDLCALC 101* 06/03/2014   LDLDIRECT 113* 02/26/2013   TRIG 200* 06/03/2014   CHOLHDL 4.8* 06/05/2013      Assessment: 1.  Diabetes.  worsening control 2.  Blood Pressure.  controlled 3.   Lipids.  not at goals 4.  Obesity - great weight reduction since surgery   Recommendations: 1.  Medication recommendations at this time are as follows:    Continue Lantus to 40 units bid  Continue Livalo 4mg  daily  Start januvia 100mg  1/2 tablet each morning 2.  Reviewed HBG goals:  Fasting 80-130 and 1-2 hour post prandial <180.  Patient is instructed to check BG 3 times per day.    3.  BP goal < 140/85. 4.  LDL goal of < 100, HDL > 40 and TG < 150. 5.  Eye Exam yearly and Dental Exam every 6 months. 6.  Dietary recommendations:  Patient is to continue diet recommended by nutritionist with [facility] 7.  Physical Activity recommendations:  Restart walking 8. Return to clinic in 4-6 wks to see clinical pharmacist / CDE.  Follow up with PCP in 2 months.   Time spent counseling patient:  30 minutes  Referring provider:  Ermalinda Memos, PharmD, CPP, CDE

## 2014-06-30 NOTE — Patient Instructions (Signed)
Diabetes and Standards of Medical Care   Diabetes is complicated. You may find that your diabetes team includes a dietitian, nurse, diabetes educator, eye doctor, and more. To help everyone know what is going on and to help you get the care you deserve, the following schedule of care was developed to help keep you on track. Below are the tests, exams, vaccines, medicines, education, and plans you will need.  Blood Glucose Goals Prior to meals = 80 - 130 Within 2 hours of the start of a meal = less than 180  HbA1c test (goal is less than 7.0% - your last value was %) This test shows how well you have controlled your glucose over the past 2 to 3 months. It is used to see if your diabetes management plan needs to be adjusted.   It is performed at least 2 times a year if you are meeting treatment goals.  It is performed 4 times a year if therapy has changed or if you are not meeting treatment goals.  Blood pressure test  This test is performed at every routine medical visit. The goal is less than 140/90 mmHg for most people, but 130/80 mmHg in some cases. Ask your health care provider about your goal.  Dental exam  Follow up with the dentist regularly.  Eye exam  If you are diagnosed with type 1 diabetes as a child, get an exam upon reaching the age of 10 years or older and have had diabetes for 3 to 5 years. Yearly eye exams are recommended after that initial eye exam.  If you are diagnosed with type 1 diabetes as an adult, get an exam within 5 years of diagnosis and then yearly.  If you are diagnosed with type 2 diabetes, get an exam as soon as possible after the diagnosis and then yearly.  Foot care exam  Visual foot exams are performed at every routine medical visit. The exams check for cuts, injuries, or other problems with the feet.  A comprehensive foot exam should be done yearly. This includes visual inspection as well as assessing foot pulses and testing for loss of  sensation.  Check your feet nightly for cuts, injuries, or other problems with your feet. Tell your health care provider if anything is not healing.  Kidney function test (urine microalbumin)  This test is performed once a year.  Type 1 diabetes: The first test is performed 5 years after diagnosis.  Type 2 diabetes: The first test is performed at the time of diagnosis.  A serum creatinine and estimated glomerular filtration rate (eGFR) test is done once a year to assess the level of chronic kidney disease (CKD), if present.  Lipid profile (cholesterol, HDL, LDL, triglycerides)  Performed every 5 years for most people.  The goal for LDL is less than 100 mg/dL. If you are at high risk, the goal is less than 70 mg/dL.  The goal for HDL is 40 mg/dL to 50 mg/dL for men and 50 mg/dL to 60 mg/dL for women. An HDL cholesterol of 60 mg/dL or higher gives some protection against heart disease.  The goal for triglycerides is less than 150 mg/dL.  Influenza vaccine, pneumococcal vaccine, and hepatitis B vaccine  The influenza vaccine is recommended yearly.  The pneumococcal vaccine is generally given once in a lifetime. However, there are some instances when another vaccination is recommended. Check with your health care provider.  The hepatitis B vaccine is also recommended for adults with diabetes.    Diabetes self-management education  Education is recommended at diagnosis and ongoing as needed.  Treatment plan  Your treatment plan is reviewed at every medical visit.  Document Released: 05/29/2009 Document Revised: 04/03/2013 Document Reviewed: 01/01/2013 [employer] Patient Information 2014 [employer].   

## 2014-07-07 NOTE — Telephone Encounter (Signed)
RX FAXED.

## 2014-07-18 ENCOUNTER — Encounter (HOSPITAL_BASED_OUTPATIENT_CLINIC_OR_DEPARTMENT_OTHER): Payer: Commercial Managed Care - HMO | Attending: Internal Medicine

## 2014-07-18 DIAGNOSIS — L97521 Non-pressure chronic ulcer of other part of left foot limited to breakdown of skin: Secondary | ICD-10-CM | POA: Diagnosis not present

## 2014-07-18 DIAGNOSIS — E11621 Type 2 diabetes mellitus with foot ulcer: Secondary | ICD-10-CM | POA: Insufficient documentation

## 2014-07-18 DIAGNOSIS — Z794 Long term (current) use of insulin: Secondary | ICD-10-CM | POA: Insufficient documentation

## 2014-07-21 ENCOUNTER — Other Ambulatory Visit: Payer: Self-pay | Admitting: Family Medicine

## 2014-07-24 ENCOUNTER — Other Ambulatory Visit: Payer: Self-pay | Admitting: Family Medicine

## 2014-07-24 ENCOUNTER — Other Ambulatory Visit: Payer: Self-pay | Admitting: Pharmacist

## 2014-07-24 NOTE — Telephone Encounter (Signed)
The directions from pharmacy are different than your last note. Your last note states 40 u bid? Can you review please. Thanks.

## 2014-07-25 ENCOUNTER — Other Ambulatory Visit (INDEPENDENT_AMBULATORY_CARE_PROVIDER_SITE_OTHER): Payer: Self-pay

## 2014-07-25 DIAGNOSIS — E119 Type 2 diabetes mellitus without complications: Secondary | ICD-10-CM

## 2014-07-25 DIAGNOSIS — K909 Intestinal malabsorption, unspecified: Secondary | ICD-10-CM

## 2014-07-25 DIAGNOSIS — Z139 Encounter for screening, unspecified: Secondary | ICD-10-CM

## 2014-07-25 DIAGNOSIS — K625 Hemorrhage of anus and rectum: Secondary | ICD-10-CM

## 2014-07-25 DIAGNOSIS — K219 Gastro-esophageal reflux disease without esophagitis: Secondary | ICD-10-CM

## 2014-07-25 DIAGNOSIS — Z9884 Bariatric surgery status: Secondary | ICD-10-CM

## 2014-07-25 DIAGNOSIS — E538 Deficiency of other specified B group vitamins: Secondary | ICD-10-CM

## 2014-07-25 DIAGNOSIS — Z794 Long term (current) use of insulin: Secondary | ICD-10-CM

## 2014-07-25 DIAGNOSIS — E440 Moderate protein-calorie malnutrition: Secondary | ICD-10-CM

## 2014-07-25 DIAGNOSIS — IMO0001 Reserved for inherently not codable concepts without codable children: Secondary | ICD-10-CM

## 2014-07-29 ENCOUNTER — Encounter: Payer: Commercial Managed Care - HMO | Attending: Surgery | Admitting: Dietician

## 2014-07-29 VITALS — Ht 67.0 in | Wt 194.0 lb

## 2014-07-29 DIAGNOSIS — E119 Type 2 diabetes mellitus without complications: Secondary | ICD-10-CM | POA: Insufficient documentation

## 2014-07-29 DIAGNOSIS — Z6830 Body mass index (BMI) 30.0-30.9, adult: Secondary | ICD-10-CM | POA: Insufficient documentation

## 2014-07-29 DIAGNOSIS — E669 Obesity, unspecified: Secondary | ICD-10-CM

## 2014-07-29 DIAGNOSIS — Z713 Dietary counseling and surveillance: Secondary | ICD-10-CM | POA: Diagnosis not present

## 2014-07-29 NOTE — Progress Notes (Signed)
  Follow-up visit:  6 months Post-Operative Gastric sleeve Surgery  Medical Nutrition Therapy:  Appt start time: 205 end time: 235   Primary concerns today: Post-operative Bariatric Surgery Nutrition Management.   [name] returns today having lost 16 pounds. Her mother died 2 weeks ago. Her HgbA1c increased and Januvia was added. Additionally her vitamin B12 levels are slightly elevated. She reports that she sometimes overeats but overall doing well and tolerating recommended foods.  Surgery date: 01/20/2014 Surgery type: Gastric sleeve Start weight at [facility]: 251 on 10/02/13 (257 lbs per patient) Weight today: 194 lbs Weight change: 16.5 lbs Total weight lost: 63 lbs  Weight loss goal: 150-160 lbs  TANITA  BODY COMP RESULTS Patient unable to use Tanita at pre op class  02/04/14 03/18/14 04/29/14 07/29/14   BMI (kg/m^2) 34.7 33 31.7 30.4   Fat Mass (lbs) 103 94.5 88.5 81   Fat Free Mass (lbs) 122 116 114 113   Total Body Water (lbs) 89.5 85 83.5 82.5    Preferred Learning Style:   No preference indicated   Learning Readiness:   Ready  24-hr recall: B (8:30AM): grilled pork chop with 1 egg Snk (AM):  L (PM): 1/2 steak and cheese sub without bread  Snk (PM): sugar free popsicle or nuts D (PM): meat with vegetables Snk (PM):   Fluid intake: water with sugar free flavoring, beef bullion, sugar free jello, protein shake (11oz); patient unsure of total amount Estimated total protein intake: 60g+ per day  Medications: Januvia added Supplementation: taking, calcium inconsistent  CBG monitoring: 1x in the am Average CBG per patient: 100-140 mg/dL fasting in the am (added Januvia) Last patient reported A1c: 8.4%  Using straws: once in a while Drinking while eating: no Hair loss: no (dry scalp) Carbonated beverages: no N/V/D/C: constipation  Dumping syndrome: none  Recent physical activity:  None; toe injury and vertigo  Progress Towards Goal(s):  In progress.   Nutritional  Diagnosis:  [place]-3.3 Overweight/obesity related to past poor dietary habits and physical inactivity as evidenced by patient w/ recent gastric sleeve surgery following dietary guidelines for continued weight loss.     Intervention:  Nutrition counseling provided.  -Watch portions of nuts! -Have as many non-starchy vegetables as you want -Limit carbs to 15 grams per meal -Weigh yourself 1-2x a week -Try a pill form of a "complete" multivitamin -Start taking vitamin B12 every other day  Teaching Method Utilized:  Visual Auditory  Barriers to learning/adherence to lifestyle change: toe injury limiting exercise; vertigo  Demonstrated degree of understanding via:  Teach Back   Monitoring/Evaluation:  Dietary intake, exercise, and body weight. Follow up in 3 months for 9 month post-op visit.

## 2014-07-29 NOTE — Patient Instructions (Addendum)
-  Watch portions of nuts! -Have as many non-starchy vegetables as you want -Limit carbs to 15 grams per meal -Weigh yourself 1-2x a week -Try a pill form of a "complete" multivitamin -Start taking vitamin B12 every other day   02/04/14 03/18/14 04/29/14 07/29/14   BMI (kg/m^2) 34.7 33 31.7 30.4   Fat Mass (lbs) 103 94.5 88.5 81   Fat Free Mass (lbs) 122 116 114 113   Total Body Water (lbs) 89.5 85 83.5 82.5

## 2014-08-04 ENCOUNTER — Ambulatory Visit (INDEPENDENT_AMBULATORY_CARE_PROVIDER_SITE_OTHER): Payer: Commercial Managed Care - HMO | Admitting: Pharmacist

## 2014-08-04 ENCOUNTER — Encounter: Payer: Self-pay | Admitting: Pharmacist

## 2014-08-04 VITALS — BP 120/60 | HR 52 | Ht 67.0 in | Wt 196.0 lb

## 2014-08-04 DIAGNOSIS — I10 Essential (primary) hypertension: Secondary | ICD-10-CM

## 2014-08-04 DIAGNOSIS — E785 Hyperlipidemia, unspecified: Secondary | ICD-10-CM

## 2014-08-04 DIAGNOSIS — E669 Obesity, unspecified: Secondary | ICD-10-CM

## 2014-08-04 DIAGNOSIS — E1151 Type 2 diabetes mellitus with diabetic peripheral angiopathy without gangrene: Secondary | ICD-10-CM

## 2014-08-04 DIAGNOSIS — E1159 Type 2 diabetes mellitus with other circulatory complications: Secondary | ICD-10-CM

## 2014-08-04 MED ORDER — PITAVASTATIN CALCIUM 4 MG PO TABS: 4.0000 mg | ORAL_TABLET | Freq: Every day | ORAL | Status: DC

## 2014-08-04 MED ORDER — METOPROLOL TARTRATE 50 MG PO TABS: 25.0000 mg | ORAL_TABLET | Freq: Two times a day (BID) | ORAL | Status: DC

## 2014-08-04 MED ORDER — LOSARTAN POTASSIUM 25 MG PO TABS: 25.0000 mg | ORAL_TABLET | Freq: Every day | ORAL | Status: DC

## 2014-08-04 MED ORDER — SITAGLIPTIN PHOSPHATE 100 MG PO TABS: 50.0000 mg | ORAL_TABLET | Freq: Every day | ORAL | Status: DC

## 2014-08-04 NOTE — Progress Notes (Signed)
Diabetes Follow-Up Visit Chief Complaint:   Chief Complaint  Patient presents with  . Diabetes     There were no vitals filed for this visit.   HPI: Patient with type 2 DM treated with insulin.  She had gastric sleeve surgery 01/20/14.  She has had no complications and has lost 56# since 11/11/13.  Currently she is taking Lantus 32 units BID and uses Novolog 22u in BG if elevated but hasn't needed this over the last 4 months. She also satarted Januvia 100mg  1/2 tablet daily about 1 month ago at our last visit.    BG ranges from 101 (surgeon does not want BG less than 100 for the few months after gastric bypass) to 199 7 day avg = 133 14 day avg = 135 30 day avg = 141   A1c was 8.5% 06/03/2014 which is up compared to 3 months ago  RBG was 192 today in office (better compared to last visit which was 250's)  Hyperlipidemia - taking pitavastatin 4mg  daily.  HTN - patient states that Benicar HCT 40.12,5mg  take 1/2 tablet daily has a high copay.  Her BP and HR has actually been on the low side of normal over the last few visits.  Diet - she is eating solid foods again - cottage cheese, lean meats.  Also drinking protein drinks.  She is taking MVI and other recommended vitamins regularly but would like recommendation for smaller MVI tablet - recommended One a Day Women's Petite  Low fat/carbohydrate diet?  Yes  Nicotine Abuse?  No Medication Compliance?  Yes Exercise?  Less lately due to family illness Alcohol Abuse?  No   Exam Edema:  negative Polyuria:  negative  Polydipsia:  negative Polyphagia:  negative  BMI:  There is no weight on file to calculate BMI.    Weight changes:  Decreased 56# since 10/2013 General Appearance:  alert, oriented, no acute distress and obese Mood/Affect:  normal   Lab Results  Component Value Date   HGBA1C 8.5 06/03/2014    No results found for: [facility]  Lab Results  Component Value Date   CHOL 184 06/03/2014   HDL 43 06/03/2014   LDLCALC 101*  06/03/2014   LDLDIRECT 113* 02/26/2013   TRIG 200* 06/03/2014   CHOLHDL 4.8* 06/05/2013      Assessment: 1.  Diabetes.  HBG readings show improved but not at goals yet. Patient is interested in retrying metformins  2.  Blood Pressure.  Controlled, low end of normal / low HR 3.  Lipids.  not at goals 4.  Obesity - great weight reduction since surgery   Recommendations: 1.  Medication recommendations at this time are as follows:    Continue Lantus to 32 units bid  Continue Livalo 4mg  daily  Continue januvia 100mg  1/2 tablet each morning  Retry metformin 1000mg  1/2 tablet daily with largest meal.  If no diarrhea after 7 days can increase to 1/2 tablet bid  Recommended One a Day Women's Petite  Change benicar HCT to Cozaar 25mg  1 tablet daily   Also decrease metoprolol 50mg  to 1/2 tablet bid.  2.  Reviewed HBG goals:  Fasting 80-130 and 1-2 hour post prandial <180.  Patient is instructed to check BG 3 times per day.    3.  BP goal < 140/85. 4.  LDL goal of < 100, HDL > 40 and TG < 150. 5.  Eye Exam yearly and Dental Exam every 6 months. 6.  Dietary recommendations:  Patient is to  continue diet recommended by nutritionist with [facility] 7.  Physical Activity recommendations:  Restart walking 8. Return to clinic in 4 weeks with PCP and 2 months clinical pharmacist / CDE   Time spent counseling patient:  45 minutes  Referring provider:  Ermalinda Memos, PharmD, CPP, CDE

## 2014-08-15 DIAGNOSIS — G459 Transient cerebral ischemic attack, unspecified: Secondary | ICD-10-CM

## 2014-08-15 HISTORY — DX: Transient cerebral ischemic attack, unspecified: G45.9

## 2014-08-19 ENCOUNTER — Ambulatory Visit: Payer: Commercial Managed Care - HMO | Admitting: Neurology

## 2014-09-08 ENCOUNTER — Ambulatory Visit (INDEPENDENT_AMBULATORY_CARE_PROVIDER_SITE_OTHER): Payer: Commercial Managed Care - HMO | Admitting: Family Medicine

## 2014-09-08 ENCOUNTER — Other Ambulatory Visit: Payer: Self-pay | Admitting: Surgery

## 2014-09-08 ENCOUNTER — Encounter: Payer: Self-pay | Admitting: Family Medicine

## 2014-09-08 VITALS — BP 151/66 | HR 56 | Temp 96.9°F | Ht 67.0 in | Wt 197.0 lb

## 2014-09-08 DIAGNOSIS — N183 Chronic kidney disease, stage 3 unspecified: Secondary | ICD-10-CM

## 2014-09-08 DIAGNOSIS — T50905A Adverse effect of unspecified drugs, medicaments and biological substances, initial encounter: Secondary | ICD-10-CM

## 2014-09-08 DIAGNOSIS — Z794 Long term (current) use of insulin: Secondary | ICD-10-CM

## 2014-09-08 DIAGNOSIS — L570 Actinic keratosis: Secondary | ICD-10-CM

## 2014-09-08 DIAGNOSIS — E559 Vitamin D deficiency, unspecified: Secondary | ICD-10-CM

## 2014-09-08 DIAGNOSIS — E1151 Type 2 diabetes mellitus with diabetic peripheral angiopathy without gangrene: Secondary | ICD-10-CM

## 2014-09-08 DIAGNOSIS — E119 Type 2 diabetes mellitus without complications: Secondary | ICD-10-CM

## 2014-09-08 DIAGNOSIS — E1159 Type 2 diabetes mellitus with other circulatory complications: Secondary | ICD-10-CM

## 2014-09-08 DIAGNOSIS — N189 Chronic kidney disease, unspecified: Secondary | ICD-10-CM

## 2014-09-08 DIAGNOSIS — E785 Hyperlipidemia, unspecified: Secondary | ICD-10-CM

## 2014-09-08 DIAGNOSIS — Z139 Encounter for screening, unspecified: Secondary | ICD-10-CM

## 2014-09-08 DIAGNOSIS — Z9884 Bariatric surgery status: Secondary | ICD-10-CM

## 2014-09-08 DIAGNOSIS — I10 Essential (primary) hypertension: Secondary | ICD-10-CM

## 2014-09-08 DIAGNOSIS — K909 Intestinal malabsorption, unspecified: Secondary | ICD-10-CM

## 2014-09-08 DIAGNOSIS — E1122 Type 2 diabetes mellitus with diabetic chronic kidney disease: Secondary | ICD-10-CM

## 2014-09-08 DIAGNOSIS — Z1211 Encounter for screening for malignant neoplasm of colon: Secondary | ICD-10-CM

## 2014-09-08 LAB — IRON AND TIBC
%SAT: 35 % (ref 20–55)
Iron: 126 ug/dL (ref 42–145)
TIBC: 363 ug/dL (ref 250–470)
UIBC: 237 ug/dL (ref 125–400)

## 2014-09-08 LAB — HEMOGLOBIN A1C
Hgb A1c MFr Bld: 8.2 % — ABNORMAL HIGH (ref <5.7)
Mean Plasma Glucose: 189 mg/dL — ABNORMAL HIGH (ref <117)

## 2014-09-08 LAB — LIPID PANEL
Cholesterol: 210 mg/dL — ABNORMAL HIGH (ref 0–200)
HDL: 52 mg/dL (ref >39)
LDL Cholesterol: 120 mg/dL — ABNORMAL HIGH (ref 0–99)
Total CHOL/HDL Ratio: 4 Ratio
Triglycerides: 192 mg/dL — ABNORMAL HIGH (ref <150)
VLDL: 38 mg/dL (ref 0–40)

## 2014-09-08 LAB — MAGNESIUM: Magnesium: 1.5 mg/dL (ref 1.5–2.5)

## 2014-09-08 MED ORDER — SITAGLIPTIN PHOSPHATE 100 MG PO TABS: 100.0000 mg | ORAL_TABLET | Freq: Every day | ORAL | Status: DC

## 2014-09-08 MED ORDER — METOPROLOL TARTRATE 50 MG PO TABS: 25.0000 mg | ORAL_TABLET | Freq: Two times a day (BID) | ORAL | Status: DC

## 2014-09-08 MED ORDER — METFORMIN HCL 1000 MG PO TABS: ORAL_TABLET | ORAL | Status: DC

## 2014-09-08 MED ORDER — INSULIN GLARGINE 100 UNIT/ML ~~LOC~~ SOLN: SUBCUTANEOUS | Status: DC

## 2014-09-08 NOTE — Progress Notes (Signed)
Subjective:    Patient ID: [name], female    DOB: 1951-04-13, 64 y.o.   MRN: [id number]  HPI Pt here for follow up and management of chronic medical problems which include hypertension, hyperlipidemia, and diabetes. She is taking medications regularly. The most recent lab work that was done on this patient had elevated blood sugar at 152. The creatinine was slightly improved from the previous reading and was 1.19 in October. It was improved the cholesterol numbers continued to improve but were still not at goal and this was discussed with the patient. The vitamin D level was improved and she was instructed to continue with her current treatment the B12 level was good and the magnesium level was good. The hemoglobin A1c remained too high and was still 8.5% and this needs to be less than 7% and she was scheduled for a visit with the clinical pharmacist to get better control. The CBC was within normal limits.  The patient complains today of constipation. She also complained of a right neck skin lesion and some right-sided abdominal pain. She is requesting refills on all her medications.  The patient indicates that she lost her mother who is 38 years old recently due to multiple chronic medical conditions.         Patient Active Problem List   Diagnosis Date Noted  . 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 CKD stage 3 due to type 2 diabetes mellitus 06/03/2014  . Type II diabetes mellitus with renal manifestations 06/03/2014  . Type II diabetes mellitus with peripheral circulatory disorder 06/03/2014  . Obesity (BMI 30.0-34.9) 04/24/2014  . Dizziness and giddiness 04/14/2014  . 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 Osteopenia 10/09/2013  . Nonhealing nonsurgical wound 09/17/2013  . Abnormal ankle brachial index 09/11/2013  . Hypomagnesemia 08/01/2013  . DYSPNEA 08/18/2010  . MYOCARDIAL PERFUSION SCAN, WITH STRESS TEST, ABNORMAL 08/04/2010  . ELECTROCARDIOGRAM,  ABNORMAL 07/28/2010  . Edema extremities 12/30/2008  . Hyperlipidemia 12/26/2008  . Benign essential HTN 12/26/2008  . CAD 12/26/2008  . PERICARDIAL EFFUSION 12/26/2008  . GERD 12/26/2008  . OSTEOARTHRITIS 12/26/2008   Outpatient Encounter Prescriptions as of 09/08/2014  Medication Sig  . ACCU-CHEK AVIVA PLUS test strip CHECK BLOOD SUGAR UP TO TWICE DAILY OR AS PRESCRIBED  . B Complex Vitamins (B-COMPLEX/B-12 SL) Place 500 mcg under the tongue every morning.  . B-D INS SYR ULTRAFINE 1CC/31G 31G X 5/16" 1 ML MISC USE TO INJECT INSULIN TWICE A DAY AS INSTRUCTED  . calcium carbonate (OS-CAL) 1250 MG chewable tablet Chew 1 tablet by mouth 3 (three) times daily.  . insulin glargine (LANTUS) 100 UNIT/ML injection Inject up to 50 units twice a day  . Insulin Pen Needle (B-D ULTRAFINE III SHORT PEN) 31G X 8 MM MISC Use to administer insulin daily  . losartan (COZAAR) 25 MG tablet Take 1 tablet (25 mg total) by mouth daily.  . metFORMIN (GLUCOPHAGE) 1000 MG tablet Take 1 tablet (1,000 mg total) by mouth 2 (two) times daily with a meal.  . metoprolol (LOPRESSOR) 50 MG tablet Take 0.5 tablets (25 mg total) by mouth 2 (two) times daily.  . Multiple Vitamin (MULTIVITAMIN WITH MINERALS) TABS tablet Take 1 tablet by mouth 2 (two) times daily.  . Pitavastatin Calcium 4 MG TABS Take 1 tablet (4 mg total) by mouth daily.  . sitaGLIPtin (JANUVIA) 100 MG tablet Take 0.5 tablets (50 mg total) by mouth daily with breakfast.    Review of Systems  Constitutional: Negative.   HENT: Negative.   Eyes: Negative.   Respiratory: Negative.   Cardiovascular: Negative.   Gastrointestinal: Positive for constipation.  Endocrine: Negative.   Genitourinary: Negative.   Musculoskeletal: Positive for myalgias (right side pain ).  Skin: Negative.        Skin lesion on right side neck  Allergic/Immunologic: Negative.   Neurological: Negative.   Hematological: Negative.   Psychiatric/Behavioral: The patient is  nervous/anxious.        Objective:   Physical Exam  Constitutional: She is oriented to person, place, and time. She appears well-developed and well-nourished. No distress.  HENT:  Head: Normocephalic and atraumatic.  Right Ear: External ear normal.  Nose: Nose normal.  Mouth/Throat: Oropharynx is clear and moist.  Cerumen left ear canal  Eyes: Conjunctivae and EOM are normal. Pupils are equal, round, and reactive to light. Right eye exhibits no discharge. Left eye exhibits no discharge. No scleral icterus.  Neck: Normal range of motion. Neck supple. No JVD present. No thyromegaly present.  No anterior cervical nodes or thyroid enlargement no carotid bruits were auscultated today  Cardiovascular: Normal rate, regular rhythm, normal heart sounds and intact distal pulses.  Exam reveals no gallop and no friction rub.   No murmur heard. The rhythm is regular at 60/m  Pulmonary/Chest: Effort normal and breath sounds normal. No respiratory distress. She has no wheezes. She has no rales. She exhibits no tenderness.  Clear anteriorly and posteriorly  Abdominal: Soft. Bowel sounds are normal. She exhibits no mass. There is no tenderness. There is no rebound and no guarding.  The abdomen remains obese. There is tenderness right side near the previous surgical scar.  Musculoskeletal: Normal range of motion. She exhibits no edema or tenderness.  Lymphadenopathy:    She has no cervical adenopathy.  Neurological: She is alert and oriented to person, place, and time. She has normal reflexes. No cranial nerve deficit.  Skin: Skin is warm and dry. No rash noted.  The patient has an actinic keratosis of right neck and another one on her left superior shoulder. Cryotherapy was done on both of these lesions without complication.  Psychiatric: She has a normal mood and affect. Her behavior is normal. Judgment and thought content normal.  Nursing note and vitals reviewed.  BP 151/66 mmHg  Pulse 56   Temp(Src) 96.9 F (36.1 C) (Oral)  Ht 5\' 7"  (1.702 m)  Wt 197 lb (89.359 kg)  BMI 30.85 kg/m2        Assessment & Plan:  1. Vitamin D deficiency -The patient should continue with her current dose of vitamin D and this will be adjusted depending on lab work that is done today. - Vit D  25 hydroxy (rtn osteoporosis monitoring) - COMPLETE METABOLIC PANEL WITH GFR - CBC with Differential/Platelet - Hemoglobin A1C - Lipid panel - Iron and TIBC - Magnesium  2. Type II diabetes mellitus with peripheral circulatory disorder -The patient should continue follow-up with the clinical pharmacists and with the additional medication of half of the Januvia that was added to her current regimen She also needs to increase her exercise routine and she is aware of this. - Vit D  25 hydroxy (rtn osteoporosis monitoring) - COMPLETE METABOLIC PANEL WITH GFR - CBC with Differential/Platelet - Hemoglobin A1C - Lipid panel - Iron and TIBC - Magnesium  3. Benign essential HTN -Continue to monitor blood pressures at home and to get this under better control and watch sodium intake - Vit D  25 hydroxy (rtn osteoporosis monitoring) - COMPLETE METABOLIC PANEL WITH GFR - CBC with Differential/Platelet - Hemoglobin A1C - Lipid panel - Iron and TIBC - Magnesium  4. Hyperlipidemia -Continue current treatment which includes Livalo and current blood sugar control along with regular as regimen - Vit D  25 hydroxy (rtn osteoporosis monitoring) - COMPLETE METABOLIC PANEL WITH GFR - CBC with Differential/Platelet - Hemoglobin A1C - Lipid panel - Iron and TIBC - Magnesium  5. CKD stage 3 due to type 2 diabetes mellitus -Continue to avoid NSAIDs and we will monitor this creatinine level regularly - Vit D  25 hydroxy (rtn osteoporosis monitoring) - COMPLETE METABOLIC PANEL WITH GFR - CBC with Differential/Platelet - Hemoglobin A1C - Lipid panel - Iron and TIBC - Magnesium  6. Drug-induced  malabsorption - Vit D  25 hydroxy (rtn osteoporosis monitoring) - COMPLETE METABOLIC PANEL WITH GFR - CBC with Differential/Platelet - Hemoglobin A1C - Lipid panel - Iron and TIBC - Magnesium  7. Bariatric surgery status -She is stable regarding her bariatric surgery and her weight today is unchanged from the reading back in December. - Vit D  25 hydroxy (rtn osteoporosis monitoring) - COMPLETE METABOLIC PANEL WITH GFR - CBC with Differential/Platelet - Hemoglobin A1C - Lipid panel - Iron and TIBC - Magnesium  8. Morbid obesity -Continue with exercise and dietary regimen and drinking plenty of fluids - Vit D  25 hydroxy (rtn osteoporosis monitoring) - COMPLETE METABOLIC PANEL WITH GFR - CBC with Differential/Platelet - Hemoglobin A1C - Lipid panel - Iron and TIBC - Magnesium  9. Diabetes mellitus without complication -Continue current treatment including insulin and metformin and Januvia, follow-up with clinical pharmacy as planned - Vit D  25 hydroxy (rtn osteoporosis monitoring) - COMPLETE METABOLIC PANEL WITH GFR - CBC with Differential/Platelet - Hemoglobin A1C - Lipid panel - Iron and TIBC - Magnesium  10. Encounter for long-term (current) use of insulin - Vit D  25 hydroxy (rtn osteoporosis monitoring) - COMPLETE METABOLIC PANEL WITH GFR - CBC with Differential/Platelet - Hemoglobin A1C - Lipid panel - Iron and TIBC - Magnesium  11. Type 2 diabetes mellitus with diabetic chronic kidney disease -We will continue to monitor renal function and hopefully his numbers continue to improve with the lab work that is done today.  12. Screening -The patient was referred to Dr. Roselie Awkward for further evaluation for gynecology purposes. - Ambulatory referral to Gynecology  13. Special screening for malignant neoplasms, colon -Patient was referred back to Dr. start with about her GI - Ambulatory referral to Gastroenterology  14. Actinic keratoses -Cryotherapy to right  neck and left shoulder lesions without complications today.  Meds ordered this encounter  Medications  . metFORMIN (GLUCOPHAGE) 1000 MG tablet    Sig: 1 tab daily as directed    Dispense:  90 tablet    Refill:  3  . metoprolol (LOPRESSOR) 50 MG tablet    Sig: Take 0.5 tablets (25 mg total) by mouth 2 (two) times daily.    Dispense:  90 tablet    Refill:  3  . sitaGLIPtin (JANUVIA) 100 MG tablet    Sig: Take 1 tablet (100 mg total) by mouth daily with breakfast. As directed    Dispense:  90 tablet    Refill:  3  . insulin glargine (LANTUS) 100 UNIT/ML injection    Sig: Inject up to 30 units twice a day    Dispense:  30 mL    Refill:  4   Patient Instructions  Medicare Annual Wellness Visit  [employer] and the medical providers at [facility] strive to bring you the best medical care.  In doing so we not only want to address your current medical conditions and concerns but also to detect new conditions early and prevent illness, disease and health-related problems.    Medicare offers a yearly Wellness Visit which allows our clinical staff to assess your need for preventative services including immunizations, lifestyle education, counseling to decrease risk of preventable diseases and screening for fall risk and other medical concerns.    This visit is provided free of charge (no copay) for all Medicare recipients. The clinical pharmacists at [facility] have begun to conduct these Wellness Visits which will also include a thorough review of all your medications.    As you primary medical provider recommend that you make an appointment for your Annual Wellness Visit if you have not done so already this year.  You may set up this appointment before you leave today or you may call back [phone] and schedule an appointment.  Please make sure when you call that you mention that you are scheduling your Annual Wellness Visit  with the clinical pharmacist so that the appointment may be made for the proper length of time.     Continue current medications. Continue good therapeutic lifestyle changes which include good diet and exercise. Fall precautions discussed with patient. If an FOBT was given today- please return it to our front desk. If you are over 37 years old - you may need Prevnar 76 or the adult Pneumonia vaccine.  Flu Shots will be available at our office starting mid- September. Please call and schedule a FLU CLINIC APPOINTMENT.   Patient should try to drink more fluids. The patient needs to remain as active physically as possible The patient should have a follow-up visit with the GYN and her gastroenterologist for pelvic exam and colonoscopy. Keep the house as cool as possible Use MiraLAX regularly with milk of magnesia per surgeon's directions if no bowel movement within 3 days. She may also try a stool softener like Colace But once again remember to drink plenty of fluids   Arrie Senate MD

## 2014-09-08 NOTE — Patient Instructions (Addendum)
Medicare Annual Wellness Visit  [place] and the medical providers at [facility] strive to bring you the best medical care.  In doing so we not only want to address your current medical conditions and concerns but also to detect new conditions early and prevent illness, disease and health-related problems.    Medicare offers a yearly Wellness Visit which allows our clinical staff to assess your need for preventative services including immunizations, lifestyle education, counseling to decrease risk of preventable diseases and screening for fall risk and other medical concerns.    This visit is provided free of charge (no copay) for all Medicare recipients. The clinical pharmacists at [facility] have begun to conduct these Wellness Visits which will also include a thorough review of all your medications.    As you primary medical provider recommend that you make an appointment for your Annual Wellness Visit if you have not done so already this year.  You may set up this appointment before you leave today or you may call back [phone] and schedule an appointment.  Please make sure when you call that you mention that you are scheduling your Annual Wellness Visit with the clinical pharmacist so that the appointment may be made for the proper length of time.     Continue current medications. Continue good therapeutic lifestyle changes which include good diet and exercise. Fall precautions discussed with patient. If an FOBT was given today- please return it to our front desk. If you are over 28 years old - you may need Prevnar 37 or the adult Pneumonia vaccine.  Flu Shots will be available at our office starting mid- September. Please call and schedule a FLU CLINIC APPOINTMENT.   Patient should try to drink more fluids. The patient needs to remain as active physically as possible The patient should have a follow-up visit with  the GYN and her gastroenterologist for pelvic exam and colonoscopy. Keep the house as cool as possible Use MiraLAX regularly with milk of magnesia per surgeon's directions if no bowel movement within 3 days. She may also try a stool softener like Colace But once again remember to drink plenty of fluids

## 2014-09-09 ENCOUNTER — Telehealth: Payer: Self-pay | Admitting: Family Medicine

## 2014-09-09 LAB — VITAMIN D 25 HYDROXY (VIT D DEFICIENCY, FRACTURES): Vit D, 25-Hydroxy: 38 ng/mL (ref 30–100)

## 2014-09-09 NOTE — Telephone Encounter (Signed)
Patient aware we do not have any samples of livalo.

## 2014-09-10 ENCOUNTER — Encounter: Payer: Self-pay | Admitting: Gastroenterology

## 2014-09-10 LAB — CBC WITH DIFFERENTIAL/PLATELET
Basophils Absolute: 0.1 10*3/uL (ref 0.0–0.1)
Basophils Relative: 1 % (ref 0–1)
Eosinophils Absolute: 0.3 10*3/uL (ref 0.0–0.7)
Eosinophils Relative: 4 % (ref 0–5)
HCT: 42.8 % (ref 36.0–46.0)
Hemoglobin: 13.7 g/dL (ref 12.0–15.0)
Lymphocytes Relative: 37 % (ref 12–46)
Lymphs Abs: 3.1 10*3/uL (ref 0.7–4.0)
MCH: 28.1 pg (ref 26.0–34.0)
MCHC: 32 g/dL (ref 30.0–36.0)
MCV: 87.7 fL (ref 78.0–100.0)
MPV: 11.4 fL (ref 8.6–12.4)
Monocytes Absolute: 0.6 10*3/uL (ref 0.1–1.0)
Monocytes Relative: 7 % (ref 3–12)
Neutro Abs: 4.3 10*3/uL (ref 1.7–7.7)
Neutrophils Relative %: 51 % (ref 43–77)
Platelets: 237 10*3/uL (ref 150–400)
RBC: 4.88 MIL/uL (ref 3.87–5.11)
RDW: 13.7 % (ref 11.5–15.5)
WBC: 8.5 10*3/uL (ref 4.0–10.5)

## 2014-09-10 LAB — BASIC METABOLIC PANEL WITH GFR
BUN: 23 mg/dL (ref 6–23)
CO2: 26 mEq/L (ref 19–32)
Calcium: 9.6 mg/dL (ref 8.4–10.5)
Chloride: 104 mEq/L (ref 96–112)
Creat: 0.99 mg/dL (ref 0.50–1.10)
GFR, Est African American: 70 mL/min
GFR, Est Non African American: 61 mL/min
Glucose, Bld: 141 mg/dL — ABNORMAL HIGH (ref 70–99)
Potassium: 5 mEq/L (ref 3.5–5.3)
Sodium: 143 mEq/L (ref 135–145)

## 2014-09-15 ENCOUNTER — Other Ambulatory Visit (HOSPITAL_COMMUNITY)
Admission: RE | Admit: 2014-09-15 | Discharge: 2014-09-15 | Disposition: A | Discharge status: Home / Self Care | Payer: Commercial Managed Care - HMO | Source: Ambulatory Visit | Attending: Obstetrics & Gynecology | Admitting: Obstetrics & Gynecology

## 2014-09-15 ENCOUNTER — Ambulatory Visit (INDEPENDENT_AMBULATORY_CARE_PROVIDER_SITE_OTHER): Payer: Commercial Managed Care - HMO | Admitting: Obstetrics & Gynecology

## 2014-09-15 ENCOUNTER — Telehealth: Payer: Self-pay | Admitting: Family Medicine

## 2014-09-15 ENCOUNTER — Encounter: Payer: Self-pay | Admitting: Obstetrics & Gynecology

## 2014-09-15 VITALS — BP 128/74 | HR 61 | Resp 16 | Ht 67.0 in | Wt 196.0 lb

## 2014-09-15 DIAGNOSIS — Z01419 Encounter for gynecological examination (general) (routine) without abnormal findings: Secondary | ICD-10-CM

## 2014-09-15 DIAGNOSIS — N760 Acute vaginitis: Secondary | ICD-10-CM | POA: Insufficient documentation

## 2014-09-15 NOTE — Progress Notes (Signed)
Patient ID: [name], female   DOB: January 18, 1951, 64 y.o.   MRN: [id number]  Chief Complaint  Patient presents with  . Gynecologic Exam    HPI [name] is a 64 y.o. female.  [id number] No LMP recorded. Patient is postmenopausal. She saw Dr.A. Ross 06/2013 normal pap HPV not done. Mammogram was done then.   HPI  Past Medical History  Diagnosis Date  . 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 Hyperlipidemia   . Cystocele   . IBS (irritable bowel syndrome)   . Varicose veins   . Superficial phlebitis   . Esophageal stricture   . Fatty liver   . Osteopenia   . Vitamin D deficiency   . Dyslipidemia   . GERD (gastroesophageal reflux disease)   . Osteoarthritis   . Pericardial effusion     a. Mod by echo 2012 at time of PNA. b. Echo 07/2012: small pericardial effusion vs fat.  . Hypertension     dr Percival Spanish  . Obesity   . CAD (coronary artery disease)     a. Nonobstructive by cath 2006 - 25% LAD, 25-30% after 1st diag, distal 25% PDA, minor irreg LCx. b. Normal nuc 2011.  Marland Kitchen Hiatal hernia     a. 07/2013: HH with small stricture holding up barium tablet (concides with patient's sx of food sticking).  . 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  . Morbid obesity   . Abnormal Doppler ultrasound of carotid artery     a. 05/2013 - mild plaque, 1-39% BICA, abnormal appearrance of thyroid (TSH wnl in 06/2013).  . Palpitations     a. PVC/PAC on EKG 08/2013.  Marland Kitchen Sleep apnea     CPAP currently broke, calling company about machine, pt does not know cpap settings, uses cpap off and on  . Family history of anesthesia complication     daughter slow to awaken after teeth extraction at age 36  . Toe ulcer 01-08-14    Left big toe-being tx. at wound center "is healing"  . PONV (postoperative nausea and vomiting)     n/v, and room spinning after cataract and parotid surgery  . Difficult intubation 09-07-2012    big neck trouble with intubation  parotid surgery"takes little med to sedate"  . Dizziness and giddiness 04/14/2014    Past Surgical History  Procedure Laterality Date  . Carpal tunnel release  5 yrs ago    Left  . Cataract extraction Bilateral yrs ago  . Cardiac catheterization      06  . Parotidectomy  09/07/2012    Procedure: PAROTIDECTOMY;  Surgeon: Melida Quitter, MD;  Location: [place];  Service: ENT;  Laterality: Left;  LEFT PAROTIDECTOMY  . Breath tek h pylori N/A 07/29/2013    Procedure: BREATH TEK H PYLORI;  Surgeon: Shann Medal, MD;  Location: [facility];  Service: General;  Laterality: N/A;  . Tubal ligation  yrs ago  . Tonsillectomy  1968  . Cholecystectomy  1982  . Esophagogastroduodenoscopy N/A 12/27/2013    Procedure: ESOPHAGOGASTRODUODENOSCOPY (EGD);  Surgeon: Shann Medal, MD;  Location: [facility];  Service: General;  Laterality: N/A;  . Laparoscopic gastric sleeve resection N/A 01/20/2014    Procedure: LAPAROSCOPIC GASTRIC SLEEVE RESECTION;  Surgeon: Shann Medal, MD;  Location: [facility];  Service: General;  Laterality: N/A;  . Upper gi endoscopy  01/20/2014    Procedure: UPPER GI ENDOSCOPY;  Surgeon:  Shann Medal, MD;  Location: [facility];  Service: General;;    Family History  Problem Relation Age of Onset  . Heart failure Mother   . Hypertension Mother   . Skin cancer Mother   . Colitis Mother   . Diabetes Mother   . Heart disease Father   . Colon polyps Father   . Diabetes Father   . Diabetes Other   . Heart attack Sister     Social History History  Substance Use Topics  . Smoking status: Never Smoker   . Smokeless tobacco: Never Used  . Alcohol Use: No    Allergies  Allergen Reactions  . Ezetimibe-Simvastatin Other (See Comments)    Leg cramps  . Penicillins Other (See Comments)    Fainted  (Tolerated Ancef)  . Celebrex [Celecoxib] Rash  . Levemir [Insulin Detemir] Rash    Current Outpatient Prescriptions  Medication Sig Dispense Refill  . ACCU-CHEK AVIVA PLUS test  strip CHECK BLOOD SUGAR UP TO TWICE DAILY OR AS PRESCRIBED 100 each 1  . B Complex Vitamins (B-COMPLEX/B-12 SL) Place 500 mcg under the tongue every morning.    . B-D INS SYR ULTRAFINE 1CC/31G 31G X 5/16" 1 ML MISC USE TO INJECT INSULIN TWICE A DAY AS INSTRUCTED 100 each 0  . calcium carbonate (OS-CAL) 1250 MG chewable tablet Chew 1 tablet by mouth 3 (three) times daily.    . insulin glargine (LANTUS) 100 UNIT/ML injection Inject up to 30 units twice a day 30 mL 4  . Insulin Pen Needle (B-D ULTRAFINE III SHORT PEN) 31G X 8 MM MISC Use to administer insulin daily 100 each 1  . losartan (COZAAR) 25 MG tablet Take 1 tablet (25 mg total) by mouth daily. 90 tablet 1  . metFORMIN (GLUCOPHAGE) 1000 MG tablet 1 tab daily as directed 90 tablet 3  . metoprolol (LOPRESSOR) 50 MG tablet Take 0.5 tablets (25 mg total) by mouth 2 (two) times daily. 90 tablet 3  . Multiple Vitamin (MULTIVITAMIN WITH MINERALS) TABS tablet Take 1 tablet by mouth 2 (two) times daily.    . Pitavastatin Calcium 4 MG TABS Take 1 tablet (4 mg total) by mouth daily. 28 tablet 0  . sitaGLIPtin (JANUVIA) 100 MG tablet Take 1 tablet (100 mg total) by mouth daily with breakfast. As directed 90 tablet 3  . [DISCONTINUED] Olmesartan-Amlodipine-HCTZ (TRIBENZOR) 40-5-12.5 MG TABS Take 1 tablet by mouth daily.       No current facility-administered medications for this visit.    Review of Systems Review of Systems  Constitutional: Negative for unexpected weight change.  Gastrointestinal: Positive for constipation. Negative for abdominal pain.  Genitourinary: Positive for vaginal discharge (odor). Negative for pelvic pain.  Psychiatric/Behavioral: Negative.     Blood pressure 128/74, pulse 61, resp. rate 16, height 5\' 7"  (1.702 m), weight 196 lb (88.905 kg).  Physical Exam Physical Exam  Constitutional: She appears well-developed. No distress.  Pulmonary/Chest: No respiratory distress.  Breasts: breasts appear normal, no suspicious  masses, no skin or nipple changes or axillary nodes.   Abdominal: Soft. She exhibits no distension and no mass.  Obese, surgical scars  Genitourinary: Uterus normal. Vaginal discharge (scant, wet prep done, no pap today.) found.  Minimal descent, no mass  Skin: Skin is warm.  Psychiatric: She has a normal mood and affect. Her behavior is normal.    Data Reviewed Pap result  Assessment    Normal gynecologic well woman exam.      Plan  Mammogram at [facility], [facility] yearly. Losing weight after bariatric surgery 7 mo ago        ARNOLD,JAMES 09/15/2014, 9:17 AM

## 2014-09-15 NOTE — Telephone Encounter (Signed)
I'm not sure which strength she is taking.

## 2014-09-15 NOTE — Telephone Encounter (Signed)
Sample given  

## 2014-09-15 NOTE — Patient Instructions (Signed)
Mammography Mammography is an X-ray of the breasts to look for changes that are not normal. The X-ray image is called a mammogram. This procedure can screen for breast cancer, can detect cancer early, and can diagnose cancer.  LET YOUR CAREGIVER KNOW ABOUT:  Breast implants.  Previous breast disease, biopsy, or surgery.  If you are breastfeeding.  Medicines taken, including vitamins, herbs, eyedrops, over-the-counter medicines, and creams.  Use of steroids (by mouth or creams).  Possibility of pregnancy, if this applies. RISKS AND COMPLICATIONS  Exposure to radiation, but at very low levels.  The results may be misinterpreted.  The results may not be accurate.  Mammography may lead to further tests.  Mammography may not catch certain cancers. BEFORE THE PROCEDURE  Schedule your test about 7 days after your menstrual period. This is when your breasts are the least tender and have signs of hormone changes.  If you have had a mammography done at a different facility in the past, get the mammogram X-rays or have them sent to your current exam facility in order to compare them.  Wash your breasts and under your arms the day of the test.  Do not wear deodorants, perfumes, or powders anywhere on your body.  Wear clothes that you can change in and out of easily. PROCEDURE Relax as much as possible during the test. Any discomfort during the test will be very brief. The test should take less than 30 minutes. The following will happen:  You will undress from the waist up and put on a gown.  You will stand in front of the X-ray machine.  Each breast will be placed between 2 plastic or glass plates. The plates will compress your breast for a few seconds.  X-rays will be taken from different angles of the breast. AFTER THE PROCEDURE  The mammogram will be examined.  Depending on the quality of the images, you may need to repeat certain parts of the test.  Ask when your test  results will be ready. Make sure you get your test results.  You may resume normal activities. Document Released: 07/29/2000 Document Revised: 10/24/2011 Document Reviewed: 05/22/2011 ExitCare Patient Information 2015 [employer]. This information is not intended to replace advice given to you by your health care provider. Make sure you discuss any questions you have with your health care provider.  

## 2014-09-17 LAB — CERVICOVAGINAL ANCILLARY ONLY
Wet Prep (BD Affirm): NEGATIVE
Wet Prep (BD Affirm): NEGATIVE
Wet Prep (BD Affirm): POSITIVE — AB

## 2014-09-18 ENCOUNTER — Telehealth: Payer: Self-pay | Admitting: *Deleted

## 2014-09-18 DIAGNOSIS — B373 Candidiasis of vulva and vagina: Secondary | ICD-10-CM

## 2014-09-18 DIAGNOSIS — B3731 Acute candidiasis of vulva and vagina: Secondary | ICD-10-CM

## 2014-09-18 MED ORDER — FLUCONAZOLE 150 MG PO TABS: 150.0000 mg | ORAL_TABLET | Freq: Once | ORAL | Status: DC

## 2014-09-18 NOTE — Telephone Encounter (Signed)
-----   Message from Woodroe Mode, MD sent at 09/18/2014  8:16 AM EST ----- Positive yeast, rx diflucan 150 mg po once

## 2014-09-18 NOTE — Telephone Encounter (Signed)
Called pt LMOM and sent meds to pharmacy

## 2014-09-23 ENCOUNTER — Other Ambulatory Visit: Payer: Self-pay | Admitting: Pharmacist

## 2014-09-23 ENCOUNTER — Other Ambulatory Visit: Payer: Self-pay | Admitting: Family Medicine

## 2014-09-28 ENCOUNTER — Other Ambulatory Visit: Payer: Self-pay | Admitting: Family Medicine

## 2014-10-01 ENCOUNTER — Telehealth: Payer: Self-pay | Admitting: Family Medicine

## 2014-10-01 NOTE — Telephone Encounter (Signed)
Discussed results and recommendations with patient. These were actually ordered by Dr Lucia Gaskins and he is the ordering physician on the lab req. These results would have been forwarded to him as well.

## 2014-10-01 NOTE — Telephone Encounter (Signed)
When I pulled lab results it doesn't look like they have been reviewed. Can you please look at them

## 2014-10-01 NOTE — Telephone Encounter (Signed)
I am not sure why these labs were not seen. I printed a paper copy please call patient with results and schedule her to see Tammy for follow-up for better blood sugar control. Hemoglobin A1c was 8.2%. On a traditional lipid panel the LDL C was elevated at 120 and the should be less than 100. The vitamin D was good so we will her to continue her current vitamin D. Serum iron levels were also good. Please call the patient and give her a copy of this report

## 2014-10-16 ENCOUNTER — Other Ambulatory Visit: Payer: Self-pay | Admitting: Family Medicine

## 2014-10-16 ENCOUNTER — Telehealth: Payer: Self-pay | Admitting: *Deleted

## 2014-10-16 NOTE — Telephone Encounter (Signed)
John,  This pt has multiple health issues.  I just wanted to have you review her chart to see if she should be done at the [facility]  Thanks, Campbell

## 2014-10-16 NOTE — Telephone Encounter (Signed)
[name],  This pt is cleared for anesthetic care at [facility]  Thanks,  Jenny Reichmann

## 2014-10-16 NOTE — Telephone Encounter (Signed)
noted 

## 2014-10-17 MED ORDER — PITAVASTATIN CALCIUM 4 MG PO TABS: 4.0000 mg | ORAL_TABLET | Freq: Every day | ORAL | Status: DC

## 2014-10-17 NOTE — Telephone Encounter (Signed)
Patient aware samples up front  

## 2014-10-20 ENCOUNTER — Telehealth: Payer: Self-pay | Admitting: *Deleted

## 2014-10-20 NOTE — Telephone Encounter (Signed)
Office visit with me or APP please 

## 2014-10-20 NOTE — Telephone Encounter (Signed)
Dr Fuller Plan, This lady is scheduled for a colon with you Tuesday 11-04-14 at 1100am its noted in her cahrt that she has been a difficult intubation in the past and per Osvaldo Angst CRNA she does not qualify for [facility] care. She also has multiple medical problems. I didn't know if you wanted an OV with her ( or extender) or if she could be a direct hospital colon.  Last colon 2005 normal  Please advise, thanks for your time. marie PV

## 2014-10-20 NOTE — Telephone Encounter (Signed)
Scheduled OV with APP amy esterwood for 3-14 Monday at 0830 am .  Pt notified as to why we needed to cancel PV and schedule OV. She states she had surgery in June 2015 with no issues and her past diff intubation was " long Ago"  Instructed her with this HX she still needs Ov as per Dr Fuller Plan.  i did leave her 3-2 [facility] colon scheduled and office can cancel if necessary to move to hosputal.  Lelan Pons PV

## 2014-10-21 ENCOUNTER — Ambulatory Visit (INDEPENDENT_AMBULATORY_CARE_PROVIDER_SITE_OTHER): Payer: Commercial Managed Care - HMO | Admitting: Nurse Practitioner

## 2014-10-21 ENCOUNTER — Encounter: Payer: Self-pay | Admitting: Nurse Practitioner

## 2014-10-21 VITALS — BP 122/84 | HR 88 | Temp 99.9°F | Ht 67.0 in | Wt 197.0 lb

## 2014-10-21 DIAGNOSIS — B9789 Other viral agents as the cause of diseases classified elsewhere: Principal | ICD-10-CM

## 2014-10-21 DIAGNOSIS — J028 Acute pharyngitis due to other specified organisms: Principal | ICD-10-CM

## 2014-10-21 DIAGNOSIS — J029 Acute pharyngitis, unspecified: Secondary | ICD-10-CM

## 2014-10-21 NOTE — Progress Notes (Signed)
  Subjective:     [name] is a 64 y.o. female who presents for evaluation of sore throat. Associated symptoms include fevers up to 100.2 degrees, hoarseness, post nasal drip, sinus and nasal congestion and sore throat. Onset of symptoms was 5 days ago, and have been gradually worsening since that time. She is drinking plenty of fluids. She has not had a recent close exposure to someone with proven streptococcal pharyngitis.  The following portions of the patient's history were reviewed and updated as appropriate: allergies, current medications, past family history, past medical history, past social history, past surgical history and problem list.  Review of Systems Pertinent items are noted in HPI.    Objective:     BP 122/84 mmHg  Pulse 88  Temp(Src) 99.9 F (37.7 C) (Oral)  Ht 5\' 7"  (1.702 m)  Wt 197 lb (89.359 kg)  BMI 30.85 kg/m2  General appearance: alert and cooperative Eyes: conjunctivae/corneas clear. PERRL, EOM's intact. Fundi benign. Ears: normal TM's and external ear canals both ears Nose: clear discharge, moderate congestion, turbinates pink, no sinus tenderness Throat: lips, mucosa, and tongue normal; teeth and gums normal and mild posterior pharynx erythema Lungs: clear to auscultation bilaterally Heart: regular rate and rhythm, S1, S2 normal, no murmur, click, rub or gallop  Laboratory Strep test done. Results:negative.    Assessment:    Acute pharyngitis, likely  pharyngitis.    Plan:     Force fluids Motrin or tylenol OTC OTC decongestant Throat lozenges if help New toothbrush in 3 days  Mary-Margaret Hassell Done, FNP

## 2014-10-21 NOTE — Patient Instructions (Signed)

## 2014-10-27 ENCOUNTER — Encounter: Payer: Self-pay | Admitting: Physician Assistant

## 2014-10-27 ENCOUNTER — Telehealth: Payer: Self-pay | Admitting: Nurse Practitioner

## 2014-10-27 ENCOUNTER — Ambulatory Visit (INDEPENDENT_AMBULATORY_CARE_PROVIDER_SITE_OTHER): Payer: Commercial Managed Care - HMO | Admitting: Physician Assistant

## 2014-10-27 VITALS — BP 110/62 | HR 100 | Ht 67.0 in | Wt 192.0 lb

## 2014-10-27 DIAGNOSIS — Z794 Long term (current) use of insulin: Secondary | ICD-10-CM

## 2014-10-27 DIAGNOSIS — Z1211 Encounter for screening for malignant neoplasm of colon: Secondary | ICD-10-CM

## 2014-10-27 MED ORDER — MOVIPREP 100 G PO SOLR: 1.0000 | ORAL | Status: DC

## 2014-10-27 MED ORDER — AZITHROMYCIN 250 MG PO TABS: ORAL_TABLET | ORAL | Status: DC

## 2014-10-27 NOTE — Telephone Encounter (Signed)
zpak sent to pharmacy 

## 2014-10-27 NOTE — Progress Notes (Signed)
Reviewed and agree with management plan.  Moustapha Tooker T. Aliyanna Wassmer, MD FACG 

## 2014-10-27 NOTE — Patient Instructions (Signed)
You have been scheduled for a colonoscopy. Please follow written instructions given to you at your visit today.  Please pick up your prep supplies at the pharmacy within the next 1-3 days. [facility] If you use inhalers (even only as needed), please bring them with you on the day of your procedure.

## 2014-10-27 NOTE — Progress Notes (Signed)
Patient ID: [name], female   DOB: [birth year], 64 y.o.   MRN: [id number]     Subjective:    Patient ID: [name], female    DOB: [birth year], 64 y.o.   MRN: [id number]  HPI [name] is a pleasant 64 year old white female, known to Dr. Fuller Plan who comes in today to discuss recall colonoscopy. Her last colon was done in October 2005 and was a normal exam. Patient has history of moderate aortic stenosis, EF of 55-60%, hypertension and insulin-dependent diabetes mellitus. She also has history of morbid obesity for which she underwent a sleeve gastrectomy about 9 months ago. She has lost about 60 pounds and says she has 30 pounds to go. She does have problems with chronic constipation and uses milk of magnesia as needed. Family history is negative for colon cancer. Patient has no current complaints of abdominal pain and changes in bowel habits melena or hematochezia.  Review of Systems Pertinent positive and negative review of systems were noted in the above HPI section.  All other review of systems was otherwise negative.  Outpatient Encounter Prescriptions as of 10/27/2014  Medication Sig  . ACCU-CHEK AVIVA PLUS test strip CHECK BLOOD SUGAR UP TO TWICE DAILY OR AS INSTRUCTED  . B Complex Vitamins (B-COMPLEX/B-12 SL) Place 500 mcg under the tongue every morning.  . B-D INS SYR ULTRAFINE 1CC/31G 31G X 5/16" 1 ML MISC USE TO INJECT INSULIN TWICE A DAY AS INSTRUCTED  . calcium carbonate (OS-CAL) 1250 MG chewable tablet Chew 1 tablet by mouth 3 (three) times daily.  . insulin glargine (LANTUS) 100 UNIT/ML injection Inject up to 30 units twice a day  . Insulin Pen Needle (B-D ULTRAFINE III SHORT PEN) 31G X 8 MM MISC Use to administer insulin daily  . losartan (COZAAR) 25 MG tablet Take 1 tablet (25 mg total) by mouth daily.  . metFORMIN (GLUCOPHAGE) 1000 MG tablet 1 tab daily as directed  . metoprolol (LOPRESSOR) 50 MG tablet Take 0.5 tablets (25 mg total) by mouth 2 (two) times daily.  .  Multiple Vitamin (MULTIVITAMIN WITH MINERALS) TABS tablet Take 1 tablet by mouth 2 (two) times daily.  . Pitavastatin Calcium 4 MG TABS Take 1 tablet (4 mg total) by mouth daily.  . sitaGLIPtin (JANUVIA) 100 MG tablet Take 1 tablet (100 mg total) by mouth daily with breakfast. As directed  . MOVIPREP 100 G SOLR Take 1 kit (200 g total) by mouth as directed.  . [DISCONTINUED] fluconazole (DIFLUCAN) 150 MG tablet Take 1 tablet (150 mg total) by mouth once. Can take additional dose three days later if symptoms persist   Allergies  Allergen Reactions  . Ezetimibe-Simvastatin Other (See Comments)    Leg cramps  . Penicillins Other (See Comments)    Fainted  (Tolerated Ancef)  . Celebrex [Celecoxib] Rash  . Levemir [Insulin Detemir] Rash   Patient Active Problem List   Diagnosis Date Noted  . 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 CKD stage 3 due to type 2 diabetes mellitus 06/03/2014  . Type II diabetes mellitus with renal manifestations 06/03/2014  . Type II diabetes mellitus with peripheral circulatory disorder 06/03/2014  . Obesity (BMI 30.0-34.9) 04/24/2014  . Dizziness and giddiness 04/14/2014  . 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 Osteopenia 10/09/2013  . Nonhealing nonsurgical wound 09/17/2013  . Abnormal ankle brachial index 09/11/2013  . Hypomagnesemia 08/01/2013  . DYSPNEA 08/18/2010  . MYOCARDIAL PERFUSION SCAN, WITH STRESS TEST, ABNORMAL 08/04/2010  . ELECTROCARDIOGRAM, ABNORMAL  07/28/2010  . Edema extremities 12/30/2008  . Hyperlipidemia 12/26/2008  . Benign essential HTN 12/26/2008  . CAD 12/26/2008  . PERICARDIAL EFFUSION 12/26/2008  . GERD 12/26/2008  . OSTEOARTHRITIS 12/26/2008   History   Social History  . Marital Status: Married    Spouse Name: N/A  . Number of Children: 3  . Years of Education: hs   Occupational History  . retired    Social History Main Topics  . Smoking status: Never Smoker   . Smokeless tobacco: Never Used  .  Alcohol Use: No  . Drug Use: No  . Sexual Activity: Yes    Birth Control/ Protection: Post-menopausal   Other Topics Concern  . Not on file   Social History Narrative   Married    Ms. Schake's family history includes Colitis in her mother; Colon polyps in her father; Diabetes in her father, mother, and other; Heart attack in her sister; Heart disease in her father; Heart failure in her mother; Hypertension in her mother; Skin cancer in her mother.      Objective:    Filed Vitals:   10/27/14 0821  BP: 110/62  Pulse: 100    Physical Exam  well-developed older white female in no acute distress, pleasant blood pressure 110/62 pulse 100 height 5 foot 7 weight 192, BMI 30.06. HEENT; nontraumatic normocephalic EOMI PERRLA sclera anicteric, Supple ;no JVD, Cardiovascular; regular rate and rhythm with O1-Y2 systolic murmur . Pulmonary ;clear bilaterally, Abdomen; morbidly obese soft nondistended no palpable mass or hepatosplenomegaly bowel sounds are present, cholecystectomy scar, Rectal;exam not done, Extremities; no clubbing cyanosis or edema skin warm and dry, Psych; mood and affect appropriate      Assessment & Plan:   #1  64 yo female with IDDM due for follow up colonoscopy for colon cancer screening #2 Hx of morbid obesity- s/p sleeve gastrectomy 2015 -weight down 60 pounds #3GERD #4 mod aortic stenosis #5 CKD stage 3  Plan; Pt will be scheduled for Colonoscopy with Dr Fuller Plan. Procedure discussed in detail with pt and she is agreeable to proceed. She believes she can tolerate a liquid prep without difficulty post sleeve gastrectomy Adjustment of insulin was discussed -she will decrease bedtime insulin to one half usual dose , and hold insulin morning of procedure.   Amy Genia Harold PA-C 10/27/2014   Cc: Chipper Herb, MD

## 2014-10-28 ENCOUNTER — Ambulatory Visit: Payer: Medicare HMO | Admitting: Dietician

## 2014-11-04 ENCOUNTER — Encounter: Payer: Medicare HMO | Admitting: Gastroenterology

## 2014-11-07 ENCOUNTER — Encounter (HOSPITAL_COMMUNITY): Payer: Self-pay

## 2014-11-07 ENCOUNTER — Emergency Department (HOSPITAL_COMMUNITY): Payer: Commercial Managed Care - HMO

## 2014-11-07 ENCOUNTER — Inpatient Hospital Stay (HOSPITAL_COMMUNITY)
Admission: EM | Admit: 2014-11-07 | Discharge: 2014-11-09 | DRG: 069 | Disposition: A | Discharge status: Home / Self Care | Payer: Commercial Managed Care - HMO | Attending: Internal Medicine | Admitting: Internal Medicine

## 2014-11-07 ENCOUNTER — Inpatient Hospital Stay (HOSPITAL_COMMUNITY): Payer: Commercial Managed Care - HMO

## 2014-11-07 DIAGNOSIS — N179 Acute kidney failure, unspecified: Secondary | ICD-10-CM | POA: Diagnosis present

## 2014-11-07 DIAGNOSIS — Z7982 Long term (current) use of aspirin: Secondary | ICD-10-CM | POA: Diagnosis not present

## 2014-11-07 DIAGNOSIS — I313 Pericardial effusion (noninflammatory): Secondary | ICD-10-CM | POA: Diagnosis present

## 2014-11-07 DIAGNOSIS — Z794 Long term (current) use of insulin: Secondary | ICD-10-CM

## 2014-11-07 DIAGNOSIS — I10 Essential (primary) hypertension: Secondary | ICD-10-CM | POA: Diagnosis present

## 2014-11-07 DIAGNOSIS — D72829 Elevated white blood cell count, unspecified: Secondary | ICD-10-CM | POA: Diagnosis present

## 2014-11-07 DIAGNOSIS — Z9842 Cataract extraction status, left eye: Secondary | ICD-10-CM | POA: Diagnosis not present

## 2014-11-07 DIAGNOSIS — J329 Chronic sinusitis, unspecified: Secondary | ICD-10-CM | POA: Diagnosis present

## 2014-11-07 DIAGNOSIS — K760 Fatty (change of) liver, not elsewhere classified: Secondary | ICD-10-CM | POA: Diagnosis present

## 2014-11-07 DIAGNOSIS — K219 Gastro-esophageal reflux disease without esophagitis: Secondary | ICD-10-CM | POA: Diagnosis present

## 2014-11-07 DIAGNOSIS — E860 Dehydration: Secondary | ICD-10-CM | POA: Diagnosis present

## 2014-11-07 DIAGNOSIS — G459 Transient cerebral ischemic attack, unspecified: Principal | ICD-10-CM | POA: Diagnosis present

## 2014-11-07 DIAGNOSIS — M8580 Other specified disorders of bone density and structure, unspecified site: Secondary | ICD-10-CM | POA: Diagnosis present

## 2014-11-07 DIAGNOSIS — Z9049 Acquired absence of other specified parts of digestive tract: Secondary | ICD-10-CM | POA: Diagnosis present

## 2014-11-07 DIAGNOSIS — E785 Hyperlipidemia, unspecified: Secondary | ICD-10-CM | POA: Diagnosis present

## 2014-11-07 DIAGNOSIS — Z888 Allergy status to other drugs, medicaments and biological substances status: Secondary | ICD-10-CM

## 2014-11-07 DIAGNOSIS — I2510 Atherosclerotic heart disease of native coronary artery without angina pectoris: Secondary | ICD-10-CM | POA: Diagnosis present

## 2014-11-07 DIAGNOSIS — E1169 Type 2 diabetes mellitus with other specified complication: Secondary | ICD-10-CM | POA: Diagnosis present

## 2014-11-07 DIAGNOSIS — K589 Irritable bowel syndrome without diarrhea: Secondary | ICD-10-CM | POA: Diagnosis present

## 2014-11-07 DIAGNOSIS — Z9841 Cataract extraction status, right eye: Secondary | ICD-10-CM

## 2014-11-07 DIAGNOSIS — Z880 Allergy status to penicillin: Secondary | ICD-10-CM | POA: Diagnosis not present

## 2014-11-07 DIAGNOSIS — Z6830 Body mass index (BMI) 30.0-30.9, adult: Secondary | ICD-10-CM

## 2014-11-07 DIAGNOSIS — R410 Disorientation, unspecified: Secondary | ICD-10-CM | POA: Diagnosis present

## 2014-11-07 DIAGNOSIS — M1990 Unspecified osteoarthritis, unspecified site: Secondary | ICD-10-CM | POA: Diagnosis present

## 2014-11-07 DIAGNOSIS — R4701 Aphasia: Secondary | ICD-10-CM | POA: Diagnosis present

## 2014-11-07 DIAGNOSIS — Z8672 Personal history of thrombophlebitis: Secondary | ICD-10-CM | POA: Diagnosis not present

## 2014-11-07 LAB — DIFFERENTIAL
Basophils Absolute: 0.1 10*3/uL (ref 0.0–0.1)
Basophils Relative: 1 % (ref 0–1)
Eosinophils Absolute: 0.2 10*3/uL (ref 0.0–0.7)
Eosinophils Relative: 2 % (ref 0–5)
Lymphocytes Relative: 20 % (ref 12–46)
Lymphs Abs: 2.5 10*3/uL (ref 0.7–4.0)
Monocytes Absolute: 0.6 10*3/uL (ref 0.1–1.0)
Monocytes Relative: 5 % (ref 3–12)
Neutro Abs: 9.4 10*3/uL — ABNORMAL HIGH (ref 1.7–7.7)
Neutrophils Relative %: 72 % (ref 43–77)

## 2014-11-07 LAB — COMPREHENSIVE METABOLIC PANEL
ALT: 17 U/L (ref 0–35)
AST: 18 U/L (ref 0–37)
Albumin: 3.1 g/dL — ABNORMAL LOW (ref 3.5–5.2)
Alkaline Phosphatase: 61 U/L (ref 39–117)
Anion gap: 9 (ref 5–15)
BUN: 30 mg/dL — ABNORMAL HIGH (ref 6–23)
CO2: 26 mmol/L (ref 19–32)
Calcium: 9.5 mg/dL (ref 8.4–10.5)
Chloride: 104 mmol/L (ref 96–112)
Creatinine, Ser: 1.13 mg/dL — ABNORMAL HIGH (ref 0.50–1.10)
GFR calc Af Amer: 59 mL/min — ABNORMAL LOW (ref >90)
GFR calc non Af Amer: 51 mL/min — ABNORMAL LOW (ref >90)
Glucose, Bld: 208 mg/dL — ABNORMAL HIGH (ref 70–99)
Potassium: 4.8 mmol/L (ref 3.5–5.1)
Sodium: 139 mmol/L (ref 135–145)
Total Bilirubin: 0.7 mg/dL (ref 0.3–1.2)
Total Protein: 6.8 g/dL (ref 6.0–8.3)

## 2014-11-07 LAB — RAPID URINE DRUG SCREEN, HOSP PERFORMED
Amphetamines: NOT DETECTED
Barbiturates: NOT DETECTED
Benzodiazepines: NOT DETECTED
Cocaine: NOT DETECTED
Opiates: NOT DETECTED
Tetrahydrocannabinol: NOT DETECTED

## 2014-11-07 LAB — GLUCOSE, CAPILLARY: Glucose-Capillary: 183 mg/dL — ABNORMAL HIGH (ref 70–99)

## 2014-11-07 LAB — URINALYSIS, ROUTINE W REFLEX MICROSCOPIC
Bilirubin Urine: NEGATIVE
Glucose, UA: NEGATIVE mg/dL
Hgb urine dipstick: NEGATIVE
Ketones, ur: NEGATIVE mg/dL
Leukocytes, UA: NEGATIVE
Nitrite: NEGATIVE
Protein, ur: NEGATIVE mg/dL
Specific Gravity, Urine: 1.017 (ref 1.005–1.030)
Urobilinogen, UA: 1 mg/dL (ref 0.0–1.0)
pH: 6 (ref 5.0–8.0)

## 2014-11-07 LAB — I-STAT CHEM 8, ED
BUN: 31 mg/dL — ABNORMAL HIGH (ref 6–23)
Calcium, Ion: 1.21 mmol/L (ref 1.13–1.30)
Chloride: 103 mmol/L (ref 96–112)
Creatinine, Ser: 1.2 mg/dL — ABNORMAL HIGH (ref 0.50–1.10)
Glucose, Bld: 213 mg/dL — ABNORMAL HIGH (ref 70–99)
HCT: 40 % (ref 36.0–46.0)
Hemoglobin: 13.6 g/dL (ref 12.0–15.0)
Potassium: 4.7 mmol/L (ref 3.5–5.1)
Sodium: 140 mmol/L (ref 135–145)
TCO2: 22 mmol/L (ref 0–100)

## 2014-11-07 LAB — CBC
HCT: 38.3 % (ref 36.0–46.0)
Hemoglobin: 12.6 g/dL (ref 12.0–15.0)
MCH: 27.9 pg (ref 26.0–34.0)
MCHC: 32.9 g/dL (ref 30.0–36.0)
MCV: 84.9 fL (ref 78.0–100.0)
Platelets: 247 10*3/uL (ref 150–400)
RBC: 4.51 MIL/uL (ref 3.87–5.11)
RDW: 13.4 % (ref 11.5–15.5)
WBC: 12.8 10*3/uL — ABNORMAL HIGH (ref 4.0–10.5)

## 2014-11-07 LAB — APTT: aPTT: 26 seconds (ref 24–37)

## 2014-11-07 LAB — I-STAT TROPONIN, ED: Troponin i, poc: 0.01 ng/mL (ref 0.00–0.08)

## 2014-11-07 LAB — PROTIME-INR
INR: 1.01 (ref 0.00–1.49)
Prothrombin Time: 13.4 seconds (ref 11.6–15.2)

## 2014-11-07 LAB — ETHANOL: Alcohol, Ethyl (B): <5 mg/dL (ref 0–9)

## 2014-11-07 MED ORDER — STROKE: EARLY STAGES OF RECOVERY BOOK
Freq: Once | Status: AC
  Administered 2014-11-07: 1

## 2014-11-07 MED ORDER — PRAVASTATIN SODIUM 40 MG PO TABS
80.0000 mg | ORAL_TABLET | Freq: Every day | ORAL | Status: DC
  Administered 2014-11-07 – 2014-11-08 (×2): 80 mg via ORAL
  Filled 2014-11-07 (×2): qty 2

## 2014-11-07 MED ORDER — INSULIN ASPART 100 UNIT/ML ~~LOC~~ SOLN
0.0000 [IU] | Freq: Three times a day (TID) | SUBCUTANEOUS | Status: DC
  Administered 2014-11-08: 3 [IU] via SUBCUTANEOUS
  Administered 2014-11-08: 1 [IU] via SUBCUTANEOUS
  Administered 2014-11-08: 2 [IU] via SUBCUTANEOUS
  Administered 2014-11-09 (×2): 3 [IU] via SUBCUTANEOUS

## 2014-11-07 MED ORDER — INSULIN GLARGINE 100 UNIT/ML ~~LOC~~ SOLN
30.0000 [IU] | Freq: Two times a day (BID) | SUBCUTANEOUS | Status: DC
  Administered 2014-11-07 – 2014-11-09 (×4): 30 [IU] via SUBCUTANEOUS
  Filled 2014-11-07 (×5): qty 0.3

## 2014-11-07 MED ORDER — GUAIFENESIN ER 600 MG PO TB12: 600.0000 mg | ORAL_TABLET | Freq: Two times a day (BID) | ORAL | Status: DC | PRN

## 2014-11-07 MED ORDER — ENOXAPARIN SODIUM 40 MG/0.4ML ~~LOC~~ SOLN
40.0000 mg | SUBCUTANEOUS | Status: DC
  Administered 2014-11-07 – 2014-11-08 (×2): 40 mg via SUBCUTANEOUS
  Filled 2014-11-07 (×2): qty 0.4

## 2014-11-07 MED ORDER — SODIUM CHLORIDE 0.9 % IV SOLN
INTRAVENOUS | Status: DC
  Administered 2014-11-07: 1000 mL via INTRAVENOUS
  Administered 2014-11-08: 12:00:00 via INTRAVENOUS

## 2014-11-07 MED ORDER — ASPIRIN 325 MG PO TABS: 325.0000 mg | ORAL_TABLET | Freq: Four times a day (QID) | ORAL | Status: DC | PRN

## 2014-11-07 NOTE — ED Notes (Signed)
Per EMS: Pt began having some confusion and expressive aphasia. Unable to exit car. No other deficits. CBG = 195 A/O x4.

## 2014-11-07 NOTE — Progress Notes (Signed)
Code stroke called at 1529, Patient arrived to [facility] ED via [facility].  As per patient/family LSN 0830, around 0930, patient was noted to have some slurred speech and some confusion.  Patient states she had contractors at her house wanting her to sign contract, however she states she was unable to comprehend what the contract stated.  Husband noted patient having trouble getting out of car with confusion around 1300.  EMS summoned.  NIHSS 0, pt c/o of headache that she has had for 2 weeks.

## 2014-11-07 NOTE — ED Provider Notes (Signed)
CSN: [id number]     Arrival date & time 11/07/14  1537 History   First MD Initiated Contact with Patient 11/07/14 1548     Chief Complaint  Patient presents with  . Code Stroke    An emergency department physician performed an initial assessment on this suspected stroke patient at 1540. (Consider location/radiation/quality/duration/timing/severity/associated sxs/prior Treatment) HPI Comments: Was with husband, around 1-1:30 he noticed some confusion and difficulty speaking. This has improved. No prior history of stroke or TIA. Patient was shopping with her husband previously and this deficit was not noted.  Patient is a 64 y.o. female presenting with neurologic complaint. The history is provided by the patient.  Neurologic Problem This is a new problem. The current episode started 1 to 2 hours ago. The problem occurs constantly. The problem has been rapidly improving. Pertinent negatives include no chest pain, no abdominal pain and no shortness of breath. Nothing aggravates the symptoms. Nothing relieves the symptoms. She has tried nothing for the symptoms.    Past Medical History  Diagnosis Date  . 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 Hyperlipidemia   . Cystocele   . IBS (irritable bowel syndrome)   . Varicose veins   . Superficial phlebitis   . Esophageal stricture   . Fatty liver   . Osteopenia   . Vitamin D deficiency   . Dyslipidemia   . GERD (gastroesophageal reflux disease)   . Osteoarthritis   . Pericardial effusion     a. Mod by echo 2012 at time of PNA. b. Echo 07/2012: small pericardial effusion vs fat.  . Hypertension     dr Percival Spanish  . Obesity   . CAD (coronary artery disease)     a. Nonobstructive by cath 2006 - 25% LAD, 25-30% after 1st diag, distal 25% PDA, minor irreg LCx. b. Normal nuc 2011.  Marland Kitchen Hiatal hernia     a. 07/2013: HH with small stricture holding up barium tablet (concides with patient's sx of food sticking).  . 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  . Morbid obesity   . Abnormal Doppler ultrasound of carotid artery     a. 05/2013 - mild plaque, 1-39% BICA, abnormal appearrance of thyroid (TSH wnl in 06/2013).  . Palpitations     a. PVC/PAC on EKG 08/2013.  Marland Kitchen Sleep apnea     CPAP currently broke, calling company about machine, pt does not know cpap settings, uses cpap off and on  . Family history of anesthesia complication     daughter slow to awaken after teeth extraction at age 48  . Toe ulcer 01-08-14    Left big toe-being tx. at wound center "is healing"  . PONV (postoperative nausea and vomiting)     n/v, and room spinning after cataract and parotid surgery  . Difficult intubation 09-07-2012    big neck trouble with intubation parotid surgery"takes little med to sedate"  . Dizziness and giddiness 04/14/2014   Past Surgical History  Procedure Laterality Date  . Carpal tunnel release  5 yrs ago    Left  . Cataract extraction Bilateral yrs ago  . Cardiac catheterization      06  . Parotidectomy  09/07/2012    Procedure: PAROTIDECTOMY;  Surgeon: Melida Quitter, MD;  Location: [facility];  Service: ENT;  Laterality: Left;  LEFT PAROTIDECTOMY  . Breath tek h pylori N/A 07/29/2013    Procedure: BREATH TEK H PYLORI;  Surgeon: Shann Medal, MD;  Location: [facility];  Service: General;  Laterality: N/A;  . Tubal ligation  yrs ago  . Tonsillectomy  1968  . Cholecystectomy  1982  . Esophagogastroduodenoscopy N/A 12/27/2013    Procedure: ESOPHAGOGASTRODUODENOSCOPY (EGD);  Surgeon: Shann Medal, MD;  Location: [facility];  Service: General;  Laterality: N/A;  . Laparoscopic gastric sleeve resection N/A 01/20/2014    Procedure: LAPAROSCOPIC GASTRIC SLEEVE RESECTION;  Surgeon: Shann Medal, MD;  Location: [facility];  Service: General;  Laterality: N/A;  . Upper gi endoscopy  01/20/2014    Procedure: UPPER GI ENDOSCOPY;  Surgeon: Shann Medal, MD;  Location: [facility];  Service: General;;   Family  History  Problem Relation Age of Onset  . Heart failure Mother   . Hypertension Mother   . Skin cancer Mother   . Colitis Mother   . Diabetes Mother   . Heart disease Father   . Colon polyps Father   . Diabetes Father   . Diabetes Other   . Heart attack Sister    History  Substance Use Topics  . Smoking status: Never Smoker   . Smokeless tobacco: Never Used  . Alcohol Use: No   OB History    Gravida Para Term Preterm AB TAB SAB Ectopic Multiple Living   3 3 3       3      Review of Systems  Constitutional: Negative for fever.  Respiratory: Negative for cough and shortness of breath.   Cardiovascular: Negative for chest pain.  Gastrointestinal: Negative for abdominal pain.  All other systems reviewed and are negative.     Allergies  Ezetimibe-simvastatin; Penicillins; Celebrex; and Levemir  Home Medications   Prior to Admission medications   Medication Sig Start Date End Date Taking? Authorizing Provider  ACCU-CHEK AVIVA PLUS test strip CHECK BLOOD SUGAR UP TO TWICE DAILY OR AS INSTRUCTED 09/29/14   Chipper Herb, MD  azithromycin (ZITHROMAX Z-PAK) 250 MG tablet As directed 10/27/14   Mary-Margaret Hassell Done, FNP  B Complex Vitamins (B-COMPLEX/B-12 SL) Place 500 mcg under the tongue every morning.    Historical Provider, MD  B-D INS SYR ULTRAFINE 1CC/31G 31G X 5/16" 1 ML MISC USE TO INJECT INSULIN TWICE A DAY AS INSTRUCTED 09/23/14   Chipper Herb, MD  calcium carbonate (OS-CAL) 1250 MG chewable tablet Chew 1 tablet by mouth 3 (three) times daily.    Historical Provider, MD  insulin glargine (LANTUS) 100 UNIT/ML injection Inject up to 30 units twice a day 09/08/14   Chipper Herb, MD  Insulin Pen Needle (B-D ULTRAFINE III SHORT PEN) 31G X 8 MM MISC Use to administer insulin daily 07/24/14   Tammy Eckard, PHARMD  losartan (COZAAR) 25 MG tablet Take 1 tablet (25 mg total) by mouth daily. 08/04/14   Tammy Eckard, PHARMD  metFORMIN (GLUCOPHAGE) 1000 MG tablet 1 tab daily as  directed 09/08/14   Chipper Herb, MD  metoprolol (LOPRESSOR) 50 MG tablet Take 0.5 tablets (25 mg total) by mouth 2 (two) times daily. 09/08/14   Chipper Herb, MD  MOVIPREP 100 G SOLR Take 1 kit (200 g total) by mouth as directed. 10/27/14   Amy S Esterwood, PA-C  Multiple Vitamin (MULTIVITAMIN WITH MINERALS) TABS tablet Take 1 tablet by mouth 2 (two) times daily.    Historical Provider, MD  Pitavastatin Calcium 4 MG TABS Take 1 tablet (4 mg total) by mouth daily. 10/17/14   Chipper Herb, MD  sitaGLIPtin (JANUVIA) 100 MG tablet Take 1 tablet (100 mg total) by mouth daily with breakfast. As directed 09/08/14   Chipper Herb, MD   BP 155/66 mmHg  Resp 18 Physical Exam  Constitutional: She is oriented to person, place, and time. She appears well-developed and well-nourished. No distress.  HENT:  Head: Normocephalic and atraumatic.  Mouth/Throat: Oropharynx is clear and moist.  Eyes: EOM are normal. Pupils are equal, round, and reactive to light.  Neck: Normal range of motion. Neck supple.  Cardiovascular: Normal rate and regular rhythm.  Exam reveals no friction rub.   No murmur heard. Pulmonary/Chest: Effort normal and breath sounds normal. No respiratory distress. She has no wheezes. She has no rales.  Abdominal: Soft. She exhibits no distension. There is no tenderness. There is no rebound.  Musculoskeletal: Normal range of motion. She exhibits no edema.  Neurological: She is alert and oriented to person, place, and time. No cranial nerve deficit. She exhibits normal muscle tone. Coordination normal.  Skin: Skin is warm. No rash noted. She is not diaphoretic.  Nursing note and vitals reviewed.   ED Course  Procedures (including critical care time) Labs Review Labs Reviewed  ETHANOL  PROTIME-INR  APTT  CBC  DIFFERENTIAL  COMPREHENSIVE METABOLIC PANEL  URINE RAPID DRUG SCREEN (HOSP PERFORMED)  URINALYSIS, ROUTINE W REFLEX MICROSCOPIC  I-STAT CHEM 8, ED  I-STAT TROPOININ, ED   I-STAT TROPOININ, ED    Imaging Review Ct Head Wo Contrast  11/07/2014   CLINICAL DATA:  Acute onset dysphasia ; hypertension  EXAM: CT HEAD WITHOUT CONTRAST  TECHNIQUE: Contiguous axial images were obtained from the base of the skull through the vertex without intravenous contrast.  COMPARISON:  Head CT March 29, 2014 and brain MRI April 26, 2014  FINDINGS: The ventricles are normal in size and configuration. There is no mass, hemorrhage, extra-axial fluid collection, or midline shift. Gray-white compartments appear unremarkable. No acute infarct apparent. Each middle cerebral artery demonstrates symmetric and essentially normal attenuation. The bony calvarium appears intact. The mastoid air cells are clear. There is an air-fluid level in the right sphenoid sinus region.  IMPRESSION: Acute right sphenoid sinusitis. No intracranial mass, hemorrhage, or focal gray - white compartment lesions/acute appearing infarct.   Electronically Signed   By: Lowella Grip III M.D.   On: 11/07/2014 16:07     EKG Interpretation   Date/Time:  Friday November 07 2014 16:07:21 EDT Ventricular Rate:  82 PR Interval:  123 QRS Duration: 140 QT Interval:  424 QTC Calculation: 495 R Axis:   0 Text Interpretation:  Sinus or ectopic atrial rhythm Nonspecific  intraventricular conduction delay Inferior infarct, old No significant  changes since last tracing Confirmed by Mingo Amber  MD, [place] [phone]) on  11/07/2014 4:10:00 PM      MDM   Final diagnoses:  TIA (transient ischemic attack)    64 year old female brought in as a code stroke be a EMS. Acutely be and have some aphasia and confusion. On arrival she speak to me in complete sentences, has normal strength and sensation, no acute deficits. She does have some difficulty repeating sentences here. Symptoms are improving. Here vitals are stable. She was evaluated by Dr. Nicole Kindred as part of the code stroke protocol. He is concerned she might have had a TIA or  small stroke. She is not a TPA candidate. We'll plan on admission once labs and head CT return. Admitted by medicine.    Evelina Bucy, MD 11/07/14 [phone]

## 2014-11-07 NOTE — H&P (Addendum)
PCP:   Redge Gainer, MD   Chief Complaint:  Difficulty speaking  HPI: 64 year old female who   has a past medical history of IDDM (insulin dependent diabetes mellitus); Hyperlipidemia; Cystocele; IBS (irritable bowel syndrome); Varicose veins; Superficial phlebitis; Esophageal stricture; Fatty liver; Osteopenia; Vitamin D deficiency; Dyslipidemia; GERD (gastroesophageal reflux disease); Osteoarthritis; Pericardial effusion; Hypertension; Obesity; CAD (coronary artery disease); Hiatal hernia; Parotid tumor; Morbid obesity; Abnormal Doppler ultrasound of carotid artery; Palpitations; Sleep apnea; Family history of anesthesia complication; Toe ulcer (01-08-14); PONV (postoperative nausea and vomiting); Difficult intubation (09-07-2012); and Dizziness and giddiness (04/14/2014). Today was brought to the hospital after patient was noted to have mild word finding difficulty. In the morning patient went shopping with her husband and upon returning home at 1:00 she was fumbling with a doorknob. Patient appeared confused at that time and was not able to express herself. She was having difficulty finding words. EMS was called and patient was brought to the hospital. In the ED code stroke was called but by the time patient was seen her symptoms had resolved and CT head was negative for acute infarct. TPA was not administered due to the resolution of symptoms. At this time patient's symptoms have completely resolved and she's back to baseline, she denies any fever or dysuria urgency frequency of urination. No chest pain shortness of breath. She denies passing out. No slurred speech no blurred vision. Patient was seen by neurology in the ED and recommended admission for TIA/stroke workup.  Allergies:   Allergies  Allergen Reactions  . Ezetimibe-Simvastatin Other (See Comments)    Leg cramps  . Celebrex [Celecoxib] Rash  . Levemir [Insulin Detemir] Rash  . Penicillins Other (See Comments)    SYNCOPE  (Tolerated Ancef)      Past Medical History  Diagnosis Date  . 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 Hyperlipidemia   . Cystocele   . IBS (irritable bowel syndrome)   . Varicose veins   . Superficial phlebitis   . Esophageal stricture   . Fatty liver   . Osteopenia   . Vitamin D deficiency   . Dyslipidemia   . GERD (gastroesophageal reflux disease)   . Osteoarthritis   . Pericardial effusion     a. Mod by echo 2012 at time of PNA. b. Echo 07/2012: small pericardial effusion vs fat.  . Hypertension     dr Percival Spanish  . Obesity   . CAD (coronary artery disease)     a. Nonobstructive by cath 2006 - 25% LAD, 25-30% after 1st diag, distal 25% PDA, minor irreg LCx. b. Normal nuc 2011.  Marland Kitchen Hiatal hernia     a. 07/2013: HH with small stricture holding up barium tablet (concides with patient's sx of food sticking).  . 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  . Morbid obesity   . Abnormal Doppler ultrasound of carotid artery     a. 05/2013 - mild plaque, 1-39% BICA, abnormal appearrance of thyroid (TSH wnl in 06/2013).  . Palpitations     a. PVC/PAC on EKG 08/2013.  Marland Kitchen Sleep apnea     CPAP currently broke, calling company about machine, pt does not know cpap settings, uses cpap off and on  . Family history of anesthesia complication     daughter slow to awaken after teeth extraction at age 31  . Toe ulcer 01-08-14    Left big toe-being tx. at wound center "is healing"  .  PONV (postoperative nausea and vomiting)     n/v, and room spinning after cataract and parotid surgery  . Difficult intubation 09-07-2012    big neck trouble with intubation parotid surgery"takes little med to sedate"  . Dizziness and giddiness 04/14/2014    Past Surgical History  Procedure Laterality Date  . Carpal tunnel release  5 yrs ago    Left  . Cataract extraction Bilateral yrs ago  . Cardiac catheterization      06  . Parotidectomy  09/07/2012    Procedure:  PAROTIDECTOMY;  Surgeon: Melida Quitter, MD;  Location: [facility];  Service: ENT;  Laterality: Left;  LEFT PAROTIDECTOMY  . Breath tek h pylori N/A 07/29/2013    Procedure: BREATH TEK H PYLORI;  Surgeon: Shann Medal, MD;  Location: [facility];  Service: General;  Laterality: N/A;  . Tubal ligation  yrs ago  . Tonsillectomy  1968  . Cholecystectomy  1982  . Esophagogastroduodenoscopy N/A 12/27/2013    Procedure: ESOPHAGOGASTRODUODENOSCOPY (EGD);  Surgeon: Shann Medal, MD;  Location: [facility];  Service: General;  Laterality: N/A;  . Laparoscopic gastric sleeve resection N/A 01/20/2014    Procedure: LAPAROSCOPIC GASTRIC SLEEVE RESECTION;  Surgeon: Shann Medal, MD;  Location: [facility];  Service: General;  Laterality: N/A;  . Upper gi endoscopy  01/20/2014    Procedure: UPPER GI ENDOSCOPY;  Surgeon: Shann Medal, MD;  Location: [facility];  Service: General;;    Prior to Admission medications   Medication Sig Start Date End Date Taking? Authorizing Provider  aspirin 325 MG tablet Take 325 mg by mouth every 6 (six) hours as needed for headache.   Yes Historical Provider, MD  B Complex Vitamins (B-COMPLEX/B-12 SL) Place 500 mcg under the tongue every morning.   Yes Historical Provider, MD  calcium carbonate (OS-CAL) 1250 MG chewable tablet Chew 1 tablet by mouth 3 (three) times daily.   Yes Historical Provider, MD  cetirizine (ZYRTEC) 10 MG tablet Take 10 mg by mouth as needed for allergies.   Yes Historical Provider, MD  guaiFENesin (MUCINEX) 600 MG 12 hr tablet Take 600 mg by mouth 2 (two) times daily as needed for cough or to loosen phlegm.   Yes Historical Provider, MD  insulin glargine (LANTUS) 100 UNIT/ML injection Inject up to 30 units twice a day 09/08/14  Yes Chipper Herb, MD  losartan (COZAAR) 25 MG tablet Take 1 tablet (25 mg total) by mouth daily. 08/04/14  Yes Tammy Eckard, PHARMD  metFORMIN (GLUCOPHAGE) 1000 MG tablet 1 tab daily as directed Patient taking differently: Take 500 mg by  mouth 2 (two) times daily with a meal. 1 tab daily as directed 09/08/14  Yes Chipper Herb, MD  metoprolol (LOPRESSOR) 50 MG tablet Take 0.5 tablets (25 mg total) by mouth 2 (two) times daily. 09/08/14  Yes Chipper Herb, MD  Multiple Vitamin (MULTIVITAMIN WITH MINERALS) TABS tablet Take 1 tablet by mouth 2 (two) times daily.   Yes Historical Provider, MD  Pitavastatin Calcium 4 MG TABS Take 1 tablet (4 mg total) by mouth daily. 10/17/14  Yes Chipper Herb, MD  sitaGLIPtin (JANUVIA) 100 MG tablet Take 1 tablet (100 mg total) by mouth daily with breakfast. As directed Patient taking differently: Take 50 mg by mouth daily with breakfast. As directed 09/08/14  Yes Chipper Herb, MD  ACCU-CHEK AVIVA PLUS test strip CHECK BLOOD SUGAR UP TO TWICE DAILY OR AS INSTRUCTED 09/29/14   Chipper Herb, MD  azithromycin (  ZITHROMAX Z-PAK) 250 MG tablet As directed 10/27/14   Mary-Margaret Hassell Done, FNP  B-D INS SYR ULTRAFINE 1CC/31G 31G X 5/16" 1 ML MISC USE TO INJECT INSULIN TWICE A DAY AS INSTRUCTED 09/23/14   Chipper Herb, MD  Insulin Pen Needle (B-D ULTRAFINE III SHORT PEN) 31G X 8 MM MISC Use to administer insulin daily 07/24/14   Tammy Eckard, PHARMD  MOVIPREP 100 G SOLR Take 1 kit (200 g total) by mouth as directed. 10/27/14   Amy S Esterwood, PA-C    Social History:  reports that she has never smoked. She has never used smokeless tobacco. She reports that she does not drink alcohol or use illicit drugs.  Family History  Problem Relation Age of Onset  . Heart failure Mother   . Hypertension Mother   . Skin cancer Mother   . Colitis Mother   . Diabetes Mother   . Heart disease Father   . Colon polyps Father   . Diabetes Father   . Diabetes Other   . Heart attack Sister      All the positives are listed in BOLD  Review of Systems:  HEENT: Headache, blurred vision, runny nose, sore throat Neck: Hypothyroidism, hyperthyroidism,,lymphadenopathy Chest : Shortness of breath, history of COPD,  Asthma Heart : Chest pain, history of coronary arterey disease GI:  Nausea, vomiting, diarrhea, constipation, GERD GU: Dysuria, urgency, frequency of urination, hematuria Neuro: Stroke, seizures, syncope, TIA Psych: Depression, anxiety, hallucinations   Physical Exam: Blood pressure 148/55, pulse 83, resp. rate 24, SpO2 92 %. Constitutional:   Patient is a well-developed and well-nourished *female in no acute distress and cooperative with exam. Head: Normocephalic and atraumatic Mouth: Mucus membranes moist Eyes: PERRL, EOMI, conjunctivae normal Neck: Supple, No Thyromegaly Cardiovascular: RRR, S1 normal, S2 normal Pulmonary/Chest: CTAB, no wheezes, rales, or rhonchi Abdominal: Soft. Non-tender, non-distended, bowel sounds are normal, no masses, organomegaly, or guarding present.  Neurological: A&O x3, Strength is normal and symmetric bilaterally, cranial nerve II-XII are grossly intact, no focal motor deficit, sensory intact to light touch bilaterally.  Extremities : No Cyanosis, Clubbing or Edema  Labs on Admission:  Basic Metabolic Panel:  Recent Labs Lab 11/07/14 1545 11/07/14 1609  NA 139 140  K 4.8 4.7  CL 104 103  CO2 26  --   GLUCOSE 208* 213*  BUN 30* 31*  CREATININE 1.13* 1.20*  CALCIUM 9.5  --    Liver Function Tests:  Recent Labs Lab 11/07/14 1545  AST 18  ALT 17  ALKPHOS 61  BILITOT 0.7  PROT 6.8  ALBUMIN 3.1*   No results for input(s): LIPASE, AMYLASE in the last 168 hours. No results for input(s): AMMONIA in the last 168 hours. CBC:  Recent Labs Lab 11/07/14 1545 11/07/14 1609  WBC 12.8*  --   NEUTROABS 9.4*  --   HGB 12.6 13.6  HCT 38.3 40.0  MCV 84.9  --   PLT 247  --     Radiological Exams on Admission: Ct Head Wo Contrast  11/07/2014   ADDENDUM REPORT: 11/07/2014 16:27  ADDENDUM: Critical Value/emergent results were called by telephone at the time of interpretation on 11/07/2014 at 4:27 pm to Dr. Nicole Kindred, neurology, who verbally  acknowledged these results.   Electronically Signed   By: Lowella Grip III M.D.   On: 11/07/2014 16:27   11/07/2014   CLINICAL DATA:  Acute onset dysphasia ; hypertension  EXAM: CT HEAD WITHOUT CONTRAST  TECHNIQUE: Contiguous axial images were obtained from the  base of the skull through the vertex without intravenous contrast.  COMPARISON:  Head CT March 29, 2014 and brain MRI April 26, 2014  FINDINGS: The ventricles are normal in size and configuration. There is no mass, hemorrhage, extra-axial fluid collection, or midline shift. Gray-white compartments appear unremarkable. No acute infarct apparent. Each middle cerebral artery demonstrates symmetric and essentially normal attenuation. The bony calvarium appears intact. The mastoid air cells are clear. There is an air-fluid level in the right sphenoid sinus region.  IMPRESSION: Acute right sphenoid sinusitis. No intracranial mass, hemorrhage, or focal gray - white compartment lesions/acute appearing infarct.  Electronically Signed: By: Lowella Grip III M.D. On: 11/07/2014 16:07    EKG: Independently reviewed. Normal sinus rhythm   Assessment/Plan Active Problems:   TIA (transient ischemic attack)  TIA We'll admit the patient for TIA workup. Will check MRI/MRA brain, carotid Dopplers, echocardiogram, check hemoglobin A1c. Patient will be continued on aspirin 325 mg by mouth daily. Stroke team will follow the patient in a.m.  Hypertension Hold antibiotics medications for permissive hypertension. If MRI is negative consider starting the medications in a.m.   Mild dehydration/AK I Patient has mild dehydration with BUN 31 creatinine 1.20, will start IV fluids. Check BMP in a.m.  Diabetes mellitus We'll start the patient on sliding-scale insulin with NovoLog. Continue Lantus 30 units subcutaneously twice a day  DVT prophylaxis Lovenox  Code status: Patient is full code  Family discussion: Admission, patients condition and plan  of care including tests being ordered have been discussed with the patient and *her daughters and husband at bedside who indicate understanding and agree with the plan and Code Status.   Time Spent on Admission: 60 minutes  [facility] Pager: [phone] 11/07/2014, 5:53 PM  If 7PM-7AM, please contact night-coverage  [email or web address]  Password [email or web address]

## 2014-11-07 NOTE — Consult Note (Signed)
Referring Physician: Mingo Amber    Chief Complaint: Expressive aphasia  HPI:                                                                                                                                         [name] is an 64 y.o. female who has noted a HA for the last 2 weeks.  This AM she was noted to have mild word finding difficulty.  Later in the morning she was noted to have some difficulty with her though processing.  She went shopping with her husband and upon returning home at 1300 hours she was noted to be fumbling with the door nob.  Due to not feeling well she laid down. Upon waking husband noted she was having difficulty expressing herself.  EMS was called to the house and code stroke was called.  Upon arrival her symptoms appeared to have resolved. CT head was negative for bleed or acute infarct. NIHSS 0. tPA was not administered due to resolved symptoms.   Date last known well: Date: 11/07/2014 Time last known well: 0830 tPA Given: No: out of window and symptoms resolved.  Modified Rankin: Rankin Score=0    Past Medical History  Diagnosis Date  . 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 Hyperlipidemia   . Cystocele   . IBS (irritable bowel syndrome)   . Varicose veins   . Superficial phlebitis   . Esophageal stricture   . Fatty liver   . Osteopenia   . Vitamin D deficiency   . Dyslipidemia   . GERD (gastroesophageal reflux disease)   . Osteoarthritis   . Pericardial effusion     a. Mod by echo 2012 at time of PNA. b. Echo 07/2012: small pericardial effusion vs fat.  . Hypertension     dr Percival Spanish  . Obesity   . CAD (coronary artery disease)     a. Nonobstructive by cath 2006 - 25% LAD, 25-30% after 1st diag, distal 25% PDA, minor irreg LCx. b. Normal nuc 2011.  Marland Kitchen Hiatal hernia     a. 07/2013: HH with small stricture holding up barium tablet (concides with patient's sx of food sticking).  . 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  . Morbid obesity   . Abnormal Doppler ultrasound of carotid artery     a. 05/2013 - mild plaque, 1-39% BICA, abnormal appearrance of thyroid (TSH wnl in 06/2013).  . Palpitations     a. PVC/PAC on EKG 08/2013.  Marland Kitchen Sleep apnea     CPAP currently broke, calling company about machine, pt does not know cpap settings, uses cpap off and on  . Family history of anesthesia complication     daughter slow to awaken after teeth extraction at age 50  . Toe ulcer 01-08-14    Left big toe-being tx.  at wound center "is healing"  . PONV (postoperative nausea and vomiting)     n/v, and room spinning after cataract and parotid surgery  . Difficult intubation 09-07-2012    big neck trouble with intubation parotid surgery"takes little med to sedate"  . Dizziness and giddiness 04/14/2014    Past Surgical History  Procedure Laterality Date  . Carpal tunnel release  5 yrs ago    Left  . Cataract extraction Bilateral yrs ago  . Cardiac catheterization      6  . Parotidectomy  09/07/2012    Procedure: PAROTIDECTOMY;  Surgeon: Melida Quitter, MD;  Location: [facility];  Service: ENT;  Laterality: Left;  LEFT PAROTIDECTOMY  . Breath tek h pylori N/A 07/29/2013    Procedure: BREATH TEK H PYLORI;  Surgeon: Shann Medal, MD;  Location: [facility];  Service: General;  Laterality: N/A;  . Tubal ligation  yrs ago  . Tonsillectomy  1968  . Cholecystectomy  1982  . Esophagogastroduodenoscopy N/A 12/27/2013    Procedure: ESOPHAGOGASTRODUODENOSCOPY (EGD);  Surgeon: Shann Medal, MD;  Location: [facility];  Service: General;  Laterality: N/A;  . Laparoscopic gastric sleeve resection N/A 01/20/2014    Procedure: LAPAROSCOPIC GASTRIC SLEEVE RESECTION;  Surgeon: Shann Medal, MD;  Location: [facility];  Service: General;  Laterality: N/A;  . Upper gi endoscopy  01/20/2014    Procedure: UPPER GI ENDOSCOPY;  Surgeon: Shann Medal, MD;  Location: [facility];  Service: General;;    Family History  Problem Relation  Age of Onset  . Heart failure Mother   . Hypertension Mother   . Skin cancer Mother   . Colitis Mother   . Diabetes Mother   . Heart disease Father   . Colon polyps Father   . Diabetes Father   . Diabetes Other   . Heart attack Sister    Social History:  reports that she has never smoked. She has never used smokeless tobacco. She reports that she does not drink alcohol or use illicit drugs.  Allergies:  Allergies  Allergen Reactions  . Ezetimibe-Simvastatin Other (See Comments)    Leg cramps  . Penicillins Other (See Comments)    Fainted  (Tolerated Ancef)  . Celebrex [Celecoxib] Rash  . Levemir [Insulin Detemir] Rash    Medications:                                                                                                                           No current facility-administered medications for this encounter.   Current Outpatient Prescriptions  Medication Sig Dispense Refill  . ACCU-CHEK AVIVA PLUS test strip CHECK BLOOD SUGAR UP TO TWICE DAILY OR AS INSTRUCTED 100 each 0  . azithromycin (ZITHROMAX Z-PAK) 250 MG tablet As directed 1 each 0  . B Complex Vitamins (B-COMPLEX/B-12 SL) Place 500 mcg under the tongue every morning.    . B-D INS SYR ULTRAFINE 1CC/31G 31G X 5/16" 1 ML MISC USE TO INJECT  INSULIN TWICE A DAY AS INSTRUCTED 100 each 2  . calcium carbonate (OS-CAL) 1250 MG chewable tablet Chew 1 tablet by mouth 3 (three) times daily.    . insulin glargine (LANTUS) 100 UNIT/ML injection Inject up to 30 units twice a day 30 mL 4  . Insulin Pen Needle (B-D ULTRAFINE III SHORT PEN) 31G X 8 MM MISC Use to administer insulin daily 100 each 1  . losartan (COZAAR) 25 MG tablet Take 1 tablet (25 mg total) by mouth daily. 90 tablet 1  . metFORMIN (GLUCOPHAGE) 1000 MG tablet 1 tab daily as directed 90 tablet 3  . metoprolol (LOPRESSOR) 50 MG tablet Take 0.5 tablets (25 mg total) by mouth 2 (two) times daily. 90 tablet 3  . MOVIPREP 100 G SOLR Take 1 kit (200 g total) by  mouth as directed. 1 kit 0  . Multiple Vitamin (MULTIVITAMIN WITH MINERALS) TABS tablet Take 1 tablet by mouth 2 (two) times daily.    . Pitavastatin Calcium 4 MG TABS Take 1 tablet (4 mg total) by mouth daily. 28 tablet 0  . sitaGLIPtin (JANUVIA) 100 MG tablet Take 1 tablet (100 mg total) by mouth daily with breakfast. As directed 90 tablet 3  . [DISCONTINUED] Olmesartan-Amlodipine-HCTZ (TRIBENZOR) 40-5-12.5 MG TABS Take 1 tablet by mouth daily.         ROS:                                                                                                                                       History obtained from the patient  General ROS: negative for - chills, fatigue, fever, night sweats, weight gain or weight loss Psychological ROS: negative for - behavioral disorder, hallucinations, memory difficulties, mood swings or suicidal ideation Ophthalmic ROS: negative for - blurry vision, double vision, eye pain or loss of vision ENT ROS: negative for - epistaxis, nasal discharge, oral lesions, sore throat, tinnitus or vertigo Allergy and Immunology ROS: negative for - hives or itchy/watery eyes Hematological and Lymphatic ROS: negative for - bleeding problems, bruising or swollen lymph nodes Endocrine ROS: negative for - galactorrhea, hair pattern changes, polydipsia/polyuria or temperature intolerance Respiratory ROS: negative for - cough, hemoptysis, shortness of breath or wheezing Cardiovascular ROS: negative for - chest pain, dyspnea on exertion, edema or irregular heartbeat Gastrointestinal ROS: negative for - abdominal pain, diarrhea, hematemesis, nausea/vomiting or stool incontinence Genito-Urinary ROS: negative for - dysuria, hematuria, incontinence or urinary frequency/urgency Musculoskeletal ROS: negative for - joint swelling or muscular weakness Neurological ROS: as noted in HPI Dermatological ROS: negative for rash and skin lesion changes  Neurologic Examination:  There were no vitals taken for this visit.  HEENT-  Normocephalic, no lesions, without obvious abnormality.  Normal external eye and conjunctiva.  Normal TM's bilaterally.  Normal auditory canals and external ears. Normal external nose, mucus membranes and septum.  Normal pharynx. Cardiovascular- regular rate and rhythm, pulses palpable throughout   Lungs- chest clear, no wheezing, rales, normal symmetric air entry Abdomen- normal findings: bowel sounds normal Extremities- no edema Lymph-no adenopathy palpable Musculoskeletal-no joint tenderness, deformity or swelling Skin-warm and dry, no hyperpigmentation, vitiligo, or suspicious lesions  Neurological Examination Mental Status: Alert, oriented, thought content appropriate.  Speech fluent without evidence of aphasia.  Able to follow 3 step commands without difficulty. Cranial Nerves: II: Discs flat bilaterally; Visual fields grossly normal, pupils equal, round, reactive to light and accommodation III,IV, VI: ptosis not present, extra-ocular motions intact bilaterally V,VII: smile symmetric, facial light touch sensation normal bilaterally VIII: hearing normal bilaterally IX,X: uvula rises symmetrically XI: bilateral shoulder shrug XII: midline tongue extension Motor: Right : Upper extremity   5/5    Left:     Upper extremity   5/5  Lower extremity   5/5     Lower extremity   5/5 Tone and bulk:normal tone throughout; no atrophy noted Sensory: Pinprick and light touch intact throughout, bilaterally Deep Tendon Reflexes: 2+ and symmetric throughout with no AJ Plantars: Right: downgoing   Left: downgoing Cerebellar: normal finger-to-nose and normal heel-to-shin test Gait: not examined due to multiple leads and patient safety       Lab Results: Basic Metabolic Panel: No results for input(s): NA, K, CL, CO2, GLUCOSE, BUN, CREATININE, CALCIUM, MG, PHOS  in the last 168 hours.  Liver Function Tests: No results for input(s): AST, ALT, ALKPHOS, BILITOT, PROT, ALBUMIN in the last 168 hours. No results for input(s): LIPASE, AMYLASE in the last 168 hours. No results for input(s): AMMONIA in the last 168 hours.  CBC: No results for input(s): WBC, NEUTROABS, HGB, HCT, MCV, PLT in the last 168 hours.  Cardiac Enzymes: No results for input(s): CKTOTAL, CKMB, CKMBINDEX, TROPONINI in the last 168 hours.  Lipid Panel: No results for input(s): CHOL, TRIG, HDL, CHOLHDL, VLDL, LDLCALC in the last 168 hours.  CBG: No results for input(s): GLUCAP in the last 168 hours.  Microbiology: Results for orders placed or performed in visit on 04/10/14  Fecal occult blood, imunochemical     Status: None   Collection Time: 04/10/14  5:39 PM  Result Value Ref Range Status   Fecal Occult Bld Negative Negative Final    Coagulation Studies: No results for input(s): LABPROT, INR in the last 72 hours.  Imaging: No results found.     Assessment and plan discussed with with attending physician and they are in agreement.    Etta Quill PA-C [facility] [phone]  11/07/2014, 4:07 PM   Assessment: 64 y.o. female presenting to ED as code stroke secondary to word finding difficulty.  On arrival symptoms had resolved. Exam was non-focal and CT head showed no acute infarct or bleed. Patient has multiple stroke risk factors and cannot rule out TIA/CVA with resolved symptoms. Patient was not a tPA candidate due to resolved symptoms and out of window.   Stroke Risk Factors - diabetes mellitus, hyperlipidemia and hypertension   Recommend: 1. HgbA1c, fasting lipid panel 2. MRI, MRA  of the brain without contrast 3. PT consult, OT consult, Speech consult 4. Echocardiogram 5. Carotid dopplers 6. Prophylactic therapy-Antiplatelet med: Aspirin - dose 325 mg daily 7. Risk  factor modification 8. Telemetry monitoring 9. Frequent neuro checks 10 NPO  until passes stroke swallow screen 11. Stroke team will follow in AM.   I personally participated in this patient's evaluation and management, including examination, as well as formulating above clinical impression and management recommendations.  Rush Farmer M.D. [facility] [phone]

## 2014-11-08 DIAGNOSIS — G459 Transient cerebral ischemic attack, unspecified: Secondary | ICD-10-CM

## 2014-11-08 LAB — GLUCOSE, CAPILLARY
Glucose-Capillary: 142 mg/dL — ABNORMAL HIGH (ref 70–99)
Glucose-Capillary: 148 mg/dL — ABNORMAL HIGH (ref 70–99)
Glucose-Capillary: 158 mg/dL — ABNORMAL HIGH (ref 70–99)
Glucose-Capillary: 177 mg/dL — ABNORMAL HIGH (ref 70–99)
Glucose-Capillary: 233 mg/dL — ABNORMAL HIGH (ref 70–99)

## 2014-11-08 LAB — LIPID PANEL
Cholesterol: 197 mg/dL (ref 0–200)
HDL: 41 mg/dL (ref >39)
LDL Cholesterol: 107 mg/dL — ABNORMAL HIGH (ref 0–99)
Total CHOL/HDL Ratio: 4.8 RATIO
Triglycerides: 243 mg/dL — ABNORMAL HIGH (ref <150)
VLDL: 49 mg/dL — ABNORMAL HIGH (ref 0–40)

## 2014-11-08 MED ORDER — METOPROLOL TARTRATE 25 MG PO TABS
25.0000 mg | ORAL_TABLET | Freq: Two times a day (BID) | ORAL | Status: DC
  Administered 2014-11-08 – 2014-11-09 (×3): 25 mg via ORAL
  Filled 2014-11-08 (×3): qty 1

## 2014-11-08 MED ORDER — ASPIRIN 325 MG PO TABS
325.0000 mg | ORAL_TABLET | Freq: Every day | ORAL | Status: DC
  Administered 2014-11-08 – 2014-11-09 (×2): 325 mg via ORAL
  Filled 2014-11-08 (×2): qty 1

## 2014-11-08 MED ORDER — TRAMADOL HCL 50 MG PO TABS
50.0000 mg | ORAL_TABLET | Freq: Once | ORAL | Status: AC
  Administered 2014-11-09: 50 mg via ORAL
  Filled 2014-11-08: qty 1

## 2014-11-08 NOTE — Progress Notes (Signed)
STROKE TEAM PROGRESS NOTE   HISTORY [name] is a 65 y.o. female who has noted a HA for the last 2 weeks. This AM she was noted to have mild word finding difficulty. Later in the morning she was noted to have some difficulty with her thought processing. She went shopping with her husband and upon returning home at 1300 hours she was noted to be fumbling with the door nob. Due to not feeling well she laid down. Upon waking husband noted she was having difficulty expressing herself. EMS was called to the house and code stroke was called. Upon arrival her symptoms appeared to have resolved. CT head was negative for bleed or acute infarct. NIHSS 0. tPA was not administered due to resolved symptoms.   Date last known well: Date: 11/07/2014 Time last known well: 0830 tPA Given: No: out of window and symptoms resolved.  Modified Rankin: Rankin Score=0   SUBJECTIVE (INTERVAL HISTORY) She is feeling better, husband at the bedside   OBJECTIVE Temp:  [98 F (36.7 C)-98.2 F (36.8 C)] 98.2 F (36.8 C) (03/25 2215) Pulse Rate:  [80-85] 81 (03/25 2215) Cardiac Rhythm:  [-] Normal sinus rhythm (03/25 2000) Resp:  [17-25] 18 (03/25 2215) BP: (133-159)/(43-69) 133/69 mmHg (03/25 2215) SpO2:  [91 %-100 %] 100 % (03/25 2215) Weight:  [87.091 kg (192 lb)] 87.091 kg (192 lb) (03/25 1908)   Recent Labs Lab 11/07/14 2213 11/08/14 0754  GLUCAP 183* 158*    Recent Labs Lab 11/07/14 1545 11/07/14 1609  NA 139 140  K 4.8 4.7  CL 104 103  CO2 26  --   GLUCOSE 208* 213*  BUN 30* 31*  CREATININE 1.13* 1.20*  CALCIUM 9.5  --     Recent Labs Lab 11/07/14 1545  AST 18  ALT 17  ALKPHOS 61  BILITOT 0.7  PROT 6.8  ALBUMIN 3.1*    Recent Labs Lab 11/07/14 1545 11/07/14 1609  WBC 12.8*  --   NEUTROABS 9.4*  --   HGB 12.6 13.6  HCT 38.3 40.0  MCV 84.9  --   PLT 247  --    No results for input(s): CKTOTAL, CKMB, CKMBINDEX, TROPONINI in the last 168 hours.  Recent Labs   11/07/14 1545  LABPROT 13.4  INR 1.01    Recent Labs  11/07/14 2242  COLORURINE YELLOW  LABSPEC 1.017  PHURINE 6.0  GLUCOSEU NEGATIVE  HGBUR NEGATIVE  BILIRUBINUR NEGATIVE  KETONESUR NEGATIVE  PROTEINUR NEGATIVE  UROBILINOGEN 1.0  NITRITE NEGATIVE  LEUKOCYTESUR NEGATIVE       Component Value Date/Time   CHOL 210* 09/08/2014 1122   CHOL 214* 06/05/2013 1634   CHOL 286* 01/24/2013 1100   TRIG 192* 09/08/2014 1122   TRIG 200* 06/03/2014 1132   TRIG 468* 01/24/2013 1100   HDL 52 09/08/2014 1122   HDL 43 06/03/2014 1132   HDL 45 06/05/2013 1634   HDL 47 01/24/2013 1100   CHOLHDL 4.0 09/08/2014 1122   CHOLHDL 4.8* 06/05/2013 1634   VLDL 38 09/08/2014 1122   LDLCALC 120* 09/08/2014 1122   LDLCALC 101* 06/03/2014 1132   LDLCALC Comment 06/05/2013 1634   LDLCALC 145* 01/24/2013 1100   Lab Results  Component Value Date   HGBA1C 8.2* 09/08/2014      Component Value Date/Time   LABOPIA NONE DETECTED 11/07/2014 2241   COCAINSCRNUR NONE DETECTED 11/07/2014 2241   LABBENZ NONE DETECTED 11/07/2014 2241   AMPHETMU NONE DETECTED 11/07/2014 2241   THCU NONE DETECTED 11/07/2014 2241  LABBARB NONE DETECTED 11/07/2014 2241     Recent Labs Lab 11/07/14 1545  ETH <5    Ct Head Wo Contrast 11/07/2014    Acute right sphenoid sinusitis. No intracranial mass, hemorrhage, or focal gray - white compartment lesions/acute appearing infarct.     Mri / MRA Brain Without Contrast 11/07/2014    1. No acute intracranial abnormality. Minimal to mild for age chronic small vessel disease.  2. Irregularity at the left ICA terminus affecting both the left MCA and ACA origins likely due to atherosclerosis. No anterior circulation hemodynamically significant stenosis or major branch occlusion.  3. 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     PHYSICAL EXAM Physical exam: Exam: Gen: NAD Eyes: anicteric sclerae, moist  conjunctivae                    CV: no MRG, no carotid bruits, no peripheral edema Mental Status: Alert, oriented to person, place, time, nomal attention/concentration.fund of knowledge/ remote and recent memory  Neuro: Detailed Neurologic Exam  Speech:    No aphasia, no dysarthria  Cranial Nerves:    The pupils are equal, round, and reactive to light.. Attempted, Fundi flat.  EOMI. Conjugate gaze. Visual fields full. Face symmetric, Tongue midline. Sensation symmetric,intact.  Motor Observation:    no involuntary movements noted. Tone appears normal.     Strength:    symmetric and grossly intact.       Sensation:  Intact to LT  Plantars downgoing.    ASSESSMENT/PLAN [name] is a 64 y.o. female with history of diabetes mellitus, hyperlipidemia, hypertension, obesity, coronary artery disease, presenting with altered mental status and word finding difficulties.. She did not receive IV t-PA due to resolution of deficits.   TIA: Dominant   Resultant  Resolution of deficits  MRI  No acute intracranial abnormality.  MRA  Moderate to severe irregularity and moderate stenosis of the left PCA P2 segment with preserved distal flow.   Carotid Doppler - pending  2D Echo - pending  LDL 107  HgbA1c pending  Lovenox for VTE prophylaxis  Diet Carb Modified Fluid consistency:: Thin; Room service appropriate?: Yes   aspirin 325 mg orally every day prior to admission, now on aspirin 325 mg orally every day. Can consider changing to Plavix.  Patient counseled to be compliant with her antithrombotic medications  Ongoing aggressive stroke risk factor management  Therapy recommendations: pending  Disposition:  pending  Hypertension  Home meds: metoprolol, losartan,  Stable   Hyperlipidemia  Home meds:  pravastatin resumed in hospital  LDL 107, goal < 70  Pravastatin dose increased  Continue statin at discharge  Diabetes  HgbA1c pending, goal <  7.0  Uncontrolled  Other Stroke Risk Factors  Advanced age  Obesity, Body mass index is 30.06 kg/(m^2).   Coronary artery disease   Other Active Problems  BUN 31 / creatinine 1.20  Mild leukocytosis  Other Pertinent History    Personally examined patient and images, and have documentes history, physical, neuro exam,assessment and plan as stated above.   Sarina Ill, MD Stroke Neurology [phone] [facility]   A total of 25 minutes was spent face-to-face with this patient. Over half this time was spent on counseling patient on the TIA diagnosis and different diagnostic and therapeutic options available.          To contact Stroke Continuity provider, please refer to [email or web address] After hours,  contact General Neurology

## 2014-11-08 NOTE — Progress Notes (Signed)
Pt ambulated to the bathroom and to chair to sit up to watch television. No noted distress. Safety measures in place. Call bell within reach. She denies pain or discomfort. Will continue to monitor.

## 2014-11-08 NOTE — Progress Notes (Signed)
  Echocardiogram 2D Echocardiogram has been performed.  Darlina Sicilian M 11/08/2014, 4:20 PM

## 2014-11-08 NOTE — Progress Notes (Addendum)
[facility] PROGRESS NOTE  ALGIE NGAI [id number] DOB: 07/04/1951 DOA: 11/07/2014 PCP: Redge Gainer, MD  Assessment/Plan: 1. TIA -continue ASA, MRI negative -Fu ECHO and carotid duplex -Fu LDL and Hbaic -Neuro following -PT/OT eval  2. Sinusitis -s/p Zpack, supportive care  3. HTN -resume metoprolol  4. DM -continue lantus, SSI  DVT proph: lovenox  Code Status: Full Code Family Communication: none at bedside Disposition Plan: home pending studies   Consultants: Neuro  HPI/Subjective: Feels well, back to [place]  Objective: Filed Vitals:   11/07/14 2215  BP: 133/69  Pulse: 81  Temp: 98.2 F (36.8 C)  Resp: 18    Intake/Output Summary (Last 24 hours) at 11/08/14 0814 Last data filed at 11/07/14 1908  Gross per 24 hour  Intake   98.9 ml  Output      0 ml  Net   98.9 ml   Filed Weights   11/07/14 1908  Weight: 87.091 kg (192 lb)    Exam:   General:  AAOx3, no distress  Cardiovascular: S1S2/RRR  Respiratory: CTAB  Abdomen: soft, NT, BS present  Musculoskeletal: no edem ac/c  Neuro: non focal  Data Reviewed: Basic Metabolic Panel:  Recent Labs Lab 11/07/14 1545 11/07/14 1609  NA 139 140  K 4.8 4.7  CL 104 103  CO2 26  --   GLUCOSE 208* 213*  BUN 30* 31*  CREATININE 1.13* 1.20*  CALCIUM 9.5  --    Liver Function Tests:  Recent Labs Lab 11/07/14 1545  AST 18  ALT 17  ALKPHOS 61  BILITOT 0.7  PROT 6.8  ALBUMIN 3.1*   No results for input(s): LIPASE, AMYLASE in the last 168 hours. No results for input(s): AMMONIA in the last 168 hours. CBC:  Recent Labs Lab 11/07/14 1545 11/07/14 1609  WBC 12.8*  --   NEUTROABS 9.4*  --   HGB 12.6 13.6  HCT 38.3 40.0  MCV 84.9  --   PLT 247  --    Cardiac Enzymes: No results for input(s): CKTOTAL, CKMB, CKMBINDEX, TROPONINI in the last 168 hours. BNP (last 3 results) No results for input(s): BNP in the last 8760 hours.  ProBNP (last 3 results) No results for  input(s): PROBNP in the last 8760 hours.  CBG:  Recent Labs Lab 11/07/14 2213 11/08/14 0754  GLUCAP 183* 158*    No results found for this or any previous visit (from the past 240 hour(s)).   Studies: Ct Head Wo Contrast  11/07/2014   ADDENDUM REPORT: 11/07/2014 16:27  ADDENDUM: Critical Value/emergent results were called by telephone at the time of interpretation on 11/07/2014 at 4:27 pm to Dr. Nicole Kindred, neurology, who verbally acknowledged these results.   Electronically Signed   By: Lowella Grip III M.D.   On: 11/07/2014 16:27   11/07/2014   CLINICAL DATA:  Acute onset dysphasia ; hypertension  EXAM: CT HEAD WITHOUT CONTRAST  TECHNIQUE: Contiguous axial images were obtained from the base of the skull through the vertex without intravenous contrast.  COMPARISON:  Head CT March 29, 2014 and brain MRI April 26, 2014  FINDINGS: The ventricles are normal in size and configuration. There is no mass, hemorrhage, extra-axial fluid collection, or midline shift. Gray-white compartments appear unremarkable. No acute infarct apparent. Each middle cerebral artery demonstrates symmetric and essentially normal attenuation. The bony calvarium appears intact. The mastoid air cells are clear. There is an air-fluid level in the right sphenoid sinus region.  IMPRESSION: Acute right sphenoid sinusitis. No intracranial  mass, hemorrhage, or focal gray - white compartment lesions/acute appearing infarct.  Electronically Signed: By: Lowella Grip III M.D. On: 11/07/2014 16:07   Mri Brain Without Contrast  11/07/2014   CLINICAL DATA:  64 year old female with word finding difficulty, confusion. Symptoms resolved in the emergency department. TIA. Initial encounter.  EXAM: MRI HEAD WITHOUT CONTRAST  MRA HEAD WITHOUT CONTRAST  TECHNIQUE: Multiplanar, multiecho pulse sequences of the brain and surrounding structures were obtained without intravenous contrast. Angiographic images of the head were obtained using  MRA technique without contrast.  COMPARISON:  Head CT without contrast 1548 hours today. Brain MRI 04/26/2014.  FINDINGS: MRI HEAD FINDINGS  Major intracranial vascular flow voids are stable. Cerebral volume is within normal limits for age. No restricted diffusion to suggest acute infarction. No midline shift, mass effect, evidence of mass lesion, ventriculomegaly, extra-axial collection or acute intracranial hemorrhage. Cervicomedullary junction and pituitary are within normal limits.  Stable and largely unremarkable for age gray and white matter signal. There is an unchanged tiny chronic micro hemorrhage in the right cerebellum on series 10, image 20. 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 No cortical encephalomalacia.  Increased bilateral mastoid effusions, but mild. New sphenoid sinus fluid levels and increased paranasal sinus mucosal thickening. Stable orbits soft tissues. Visualized scalp soft tissues are within normal limits. Normal bone marrow signal.  MRA HEAD FINDINGS  Antegrade flow in the posterior circulation with normal distal vertebral arteries and normal PICA origins. Normal vertebrobasilar junction. No basilar artery stenosis. Normal AICA and SCA origins. Fetal type bilateral PCA origins, more so the right. Right PCA branches are within normal limits.  There is focal moderate to severe irregularity and moderate stenosis of the left PCA P2 segment (series 307, image 5) with preserved distal flow.  Antegrade flow in both ICA siphons. Mild siphon irregularity without stenosis. Bilateral ophthalmic and posterior communicating artery origins are within normal limits. Patent carotid termini. Mild left ICA terminus MCA and ACA origin irregularity without origin stenosis.  Anterior communicating artery and visualized bilateral ACA branches are within normal limits. Visualized right MCA branches are within normal limits. Visualized left MCA branches are within normal limits. No left MCA branch  stenosis or occlusion identified. Infundibulum of the left lenticulostriate vessels.  IMPRESSION: 1. No acute intracranial abnormality. Minimal to mild for age chronic small vessel disease. 2. Irregularity at the left ICA terminus affecting both the left MCA and ACA origins likely due to atherosclerosis. No anterior circulation hemodynamically significant stenosis or major branch occlusion. 3. 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   Electronically Signed   By: Genevie Ann M.D.   On: 11/07/2014 21:32   Mr Jodene Nam Head/brain Wo Cm  11/07/2014   CLINICAL DATA:  64 year old female with word finding difficulty, confusion. Symptoms resolved in the emergency department. TIA. Initial encounter.  EXAM: MRI HEAD WITHOUT CONTRAST  MRA HEAD WITHOUT CONTRAST  TECHNIQUE: Multiplanar, multiecho pulse sequences of the brain and surrounding structures were obtained without intravenous contrast. Angiographic images of the head were obtained using MRA technique without contrast.  COMPARISON:  Head CT without contrast 1548 hours today. Brain MRI 04/26/2014.  FINDINGS: MRI HEAD FINDINGS  Major intracranial vascular flow voids are stable. Cerebral volume is within normal limits for age. No restricted diffusion to suggest acute infarction. No midline shift, mass effect, evidence of mass lesion, ventriculomegaly, extra-axial collection or acute intracranial hemorrhage. Cervicomedullary junction and pituitary are within normal limits.  Stable and largely unremarkable for age gray and white matter signal. There is an unchanged tiny chronic micro hemorrhage in the right cerebellum on series 10, image 20. 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 No cortical encephalomalacia.  Increased bilateral mastoid effusions, but mild. New sphenoid sinus fluid levels and increased paranasal sinus mucosal thickening. Stable orbits soft tissues. Visualized scalp soft tissues are within  normal limits. Normal bone marrow signal.  MRA HEAD FINDINGS  Antegrade flow in the posterior circulation with normal distal vertebral arteries and normal PICA origins. Normal vertebrobasilar junction. No basilar artery stenosis. Normal AICA and SCA origins. Fetal type bilateral PCA origins, more so the right. Right PCA branches are within normal limits.  There is focal moderate to severe irregularity and moderate stenosis of the left PCA P2 segment (series 307, image 5) with preserved distal flow.  Antegrade flow in both ICA siphons. Mild siphon irregularity without stenosis. Bilateral ophthalmic and posterior communicating artery origins are within normal limits. Patent carotid termini. Mild left ICA terminus MCA and ACA origin irregularity without origin stenosis.  Anterior communicating artery and visualized bilateral ACA branches are within normal limits. Visualized right MCA branches are within normal limits. Visualized left MCA branches are within normal limits. No left MCA branch stenosis or occlusion identified. Infundibulum of the left lenticulostriate vessels.  IMPRESSION: 1. No acute intracranial abnormality. Minimal to mild for age chronic small vessel disease. 2. Irregularity at the left ICA terminus affecting both the left MCA and ACA origins likely due to atherosclerosis. No anterior circulation hemodynamically significant stenosis or major branch occlusion. 3. 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   Electronically Signed   By: Genevie Ann M.D.   On: 11/07/2014 21:32    Scheduled Meds: . aspirin  325 mg Oral Daily  . enoxaparin (LOVENOX) injection  40 mg Subcutaneous Q24H  . insulin aspart  0-9 Units Subcutaneous TID WC  . insulin glargine  30 Units Subcutaneous BID  . pravastatin  80 mg Oral q1800   Continuous Infusions: . sodium chloride 1,000 mL (11/07/14 2130)   Antibiotics Given (last 72 hours)    None       Active Problems:   TIA (transient ischemic attack)    Time spent: 94min    Hilding Quintanar  [facility] Pager [phone]. If 7PM-7AM, please contact night-coverage at [email or web address], password [facility] 11/08/2014, 8:14 AM  LOS: 1 day

## 2014-11-09 DIAGNOSIS — G459 Transient cerebral ischemic attack, unspecified: Secondary | ICD-10-CM

## 2014-11-09 LAB — GLUCOSE, CAPILLARY
Glucose-Capillary: 217 mg/dL — ABNORMAL HIGH (ref 70–99)
Glucose-Capillary: 215 mg/dL — ABNORMAL HIGH (ref 70–99)

## 2014-11-09 MED ORDER — IBUPROFEN 200 MG PO TABS
400.0000 mg | ORAL_TABLET | Freq: Once | ORAL | Status: AC
  Administered 2014-11-09: 400 mg via ORAL
  Filled 2014-11-09: qty 2

## 2014-11-09 MED ORDER — ONDANSETRON HCL 4 MG/2ML IJ SOLN: 4.0000 mg | Freq: Four times a day (QID) | INTRAMUSCULAR | Status: DC | PRN

## 2014-11-09 MED ORDER — SALINE SPRAY 0.65 % NA SOLN
1.0000 | NASAL | Status: DC | PRN
  Administered 2014-11-09: 1 via NASAL
  Filled 2014-11-09: qty 44

## 2014-11-09 MED ORDER — SALINE SPRAY 0.65 % NA SOLN: 1.0000 | NASAL | Status: DC | PRN

## 2014-11-09 NOTE — Progress Notes (Signed)
Pt complaint of headache, nausea, and vomiting noted brown liquid in color after eating her breakfast.  Assessment done, no noted distress.  Pt stated, " I think its my sinuses, I feel achy right above my eyes and around my eyes are swollen." Spoke to Md received new order for Zofran and nasal spray. Will continue to monitor.

## 2014-11-09 NOTE — Progress Notes (Signed)
Stroke team will sign off at this time. Please call if we can be of further service. Follow-up with Dr. Jaynee Eagles in one month. Carotid Dopplers and 2-D echocardiogram unremarkable.  Mikey Bussing PA-C [facility] Pager [phone] 11/09/2014, 10:38 AM

## 2014-11-09 NOTE — Progress Notes (Signed)
VASCULAR LAB PRELIMINARY  PRELIMINARY  PRELIMINARY  PRELIMINARY  Carotid duplex  completed.    Preliminary report:  Bilateral:  1-39% ICA stenosis.  Vertebral artery flow is antegrade.      Danita Proud, RVT 11/09/2014, 1:55 PM

## 2014-11-09 NOTE — Progress Notes (Signed)
Pt discharging with her husband at this time alert, verbal taking all personal belongings. No noted distress. IV"s discontinued applied dry dressing. Discharge instructions provided with verbal understanding. Pt to pick up prescription at pharmacy. Pt aware of follow up appts.

## 2014-11-09 NOTE — Discharge Summary (Signed)
Physician Discharge Summary  MAYDELL KNOEBEL XUX:[id number] DOB: 1950/11/24 DOA: 11/07/2014  PCP: Redge Gainer, MD  Admit date: 11/07/2014 Discharge date: 11/09/2014  Time spent: 35 minutes  Recommendations for Outpatient Follow-up:  1. PCP in 1 week 2. Neurology Dr.Ahern in 2month Discharge Diagnoses:  Active Problems:   TIA (transient ischemic attack)   DM   HTN   Sinusitis   Dyslipidemia   Mild pericardial effusion  Discharge Condition: stable  Diet recommendation: Diabetic  Filed Weights   11/07/14 1908  Weight: 87.091 kg (192 lb)    History of present illness:  She was brought to the hospital after patient was noted to have mild word finding difficulty. In the morning patient went shopping with her husband and upon returning home she was fumbling with a doorknob. Patient appeared confused at that time and was not able to express herself. She had difficulty finding words  Hospital Course:  1. TIA -symptoms resolved  -continue [id number] per Neurology -MRI negative -2D ECHO with normal EF and wall motion, mild valvular heart disease -Carotid duplex: normal -LDL 107, continue statin -Hbaic pending -Followed by Neurology this admission, Neurology will FU post DC -PT/OT eval completed  2. Sinusitis -s/p Zpack, continue supportive care  3. HTN -resume metoprolol  4. DM -continue lantus, SSI  Consultations:  Neurology  Discharge Exam: Filed Vitals:   11/09/14 1028  BP: 146/60  Pulse: 78  Temp: 98.4 F (36.9 C)  Resp: 20    General: AAOx3 Cardiovascular: S1S2/RRR Respiratory: CTAB  Discharge Instructions   Discharge Instructions    Ambulatory referral to Neurology    Complete by:  As directed   Dr. AhJaynee Eaglesequests follow up for this patient in 1 month.     Diet - low sodium heart healthy    Complete by:  As directed      Diet Carb Modified    Complete by:  As directed      Increase activity slowly    Complete by:  As directed            Current Discharge Medication List    START taking these medications   Details  sodium chloride (OCEAN) 0.65 % SOLN nasal spray Place 1 spray into both nostrils as needed for congestion. Qty: 15 mL, Refills: 0      CONTINUE these medications which have NOT CHANGED   Details  aspirin 325 MG tablet Take 325 mg by mouth every 6 (six) hours as needed for headache.    B Complex Vitamins (B-COMPLEX/B-12 SL) Place 500 mcg under the tongue every morning.    calcium carbonate (OS-CAL) 1250 MG chewable tablet Chew 1 tablet by mouth 3 (three) times daily.    cetirizine (ZYRTEC) 10 MG tablet Take 10 mg by mouth as needed for allergies.    insulin glargine (LANTUS) 100 UNIT/ML injection Inject up to 30 units twice a day Qty: 30 mL, Refills: 4    losartan (COZAAR) 25 MG tablet Take 1 tablet (25 mg total) by mouth daily. Qty: 90 tablet, Refills: 1    metFORMIN (GLUCOPHAGE) 1000 MG tablet 1 tab daily as directed Qty: 90 tablet, Refills: 3    metoprolol (LOPRESSOR) 50 MG tablet Take 0.5 tablets (25 mg total) by mouth 2 (two) times daily. Qty: 90 tablet, Refills: 3    Multiple Vitamin (MULTIVITAMIN WITH MINERALS) TABS tablet Take 1 tablet by mouth 2 (two) times daily.    Pitavastatin Calcium 4 MG TABS Take 1 tablet (4 mg  total) by mouth daily. Qty: 28 tablet, Refills: 0    sitaGLIPtin (JANUVIA) 100 MG tablet Take 1 tablet (100 mg total) by mouth daily with breakfast. As directed Qty: 90 tablet, Refills: 3    ACCU-CHEK AVIVA PLUS test strip CHECK BLOOD SUGAR UP TO TWICE DAILY OR AS INSTRUCTED Qty: 100 each, Refills: 0    B-D INS SYR ULTRAFINE 1CC/31G 31G X 5/16" 1 ML MISC USE TO INJECT INSULIN TWICE A DAY AS INSTRUCTED Qty: 100 each, Refills: 2    Insulin Pen Needle (B-D ULTRAFINE III SHORT PEN) 31G X 8 MM MISC Use to administer insulin daily Qty: 100 each, Refills: 1    MOVIPREP 100 G SOLR Take 1 kit (200 g total) by mouth as directed. Qty: 1 kit, Refills: 0   Associated  Diagnoses: Special screening for malignant neoplasms, colon      STOP taking these medications     guaiFENesin (MUCINEX) 600 MG 12 hr tablet      azithromycin (ZITHROMAX Z-PAK) 250 MG tablet        Allergies  Allergen Reactions  . Ezetimibe-Simvastatin Other (See Comments)    Leg cramps  . Celebrex [Celecoxib] Rash  . Levemir [Insulin Detemir] Rash  . Penicillins Other (See Comments)    SYNCOPE (Tolerated Ancef)   Follow-up Information    Follow up with Melvenia Beam, MD. Schedule an appointment as soon as possible for a visit in 1 month.   Specialty:  Neurology   Contact information:   [address] [address] [phone]        The results of significant diagnostics from this hospitalization (including imaging, microbiology, ancillary and laboratory) are listed below for reference.    Significant Diagnostic Studies: Ct Head Wo Contrast  11/07/2014   ADDENDUM REPORT: 11/07/2014 16:27  ADDENDUM: Critical Value/emergent results were called by telephone at the time of interpretation on 11/07/2014 at 4:27 pm to Dr. Nicole Kindred, neurology, who verbally acknowledged these results.   Electronically Signed   By: Lowella Grip III M.D.   On: 11/07/2014 16:27   11/07/2014   CLINICAL DATA:  Acute onset dysphasia ; hypertension  EXAM: CT HEAD WITHOUT CONTRAST  TECHNIQUE: Contiguous axial images were obtained from the base of the skull through the vertex without intravenous contrast.  COMPARISON:  Head CT March 29, 2014 and brain MRI April 26, 2014  FINDINGS: The ventricles are normal in size and configuration. There is no mass, hemorrhage, extra-axial fluid collection, or midline shift. Gray-white compartments appear unremarkable. No acute infarct apparent. Each middle cerebral artery demonstrates symmetric and essentially normal attenuation. The bony calvarium appears intact. The mastoid air cells are clear. There is an air-fluid level in the right sphenoid sinus  region.  IMPRESSION: Acute right sphenoid sinusitis. No intracranial mass, hemorrhage, or focal gray - white compartment lesions/acute appearing infarct.  Electronically Signed: By: Lowella Grip III M.D. On: 11/07/2014 16:07   Mri Brain Without Contrast  11/07/2014   CLINICAL DATA:  64 year old female with word finding difficulty, confusion. Symptoms resolved in the emergency department. TIA. Initial encounter.  EXAM: MRI HEAD WITHOUT CONTRAST  MRA HEAD WITHOUT CONTRAST  TECHNIQUE: Multiplanar, multiecho pulse sequences of the brain and surrounding structures were obtained without intravenous contrast. Angiographic images of the head were obtained using MRA technique without contrast.  COMPARISON:  Head CT without contrast 1548 hours today. Brain MRI 04/26/2014.  FINDINGS: MRI HEAD FINDINGS  Major intracranial vascular flow voids are stable. Cerebral volume is  within normal limits for age. No restricted diffusion to suggest acute infarction. No midline shift, mass effect, evidence of mass lesion, ventriculomegaly, extra-axial collection or acute intracranial hemorrhage. Cervicomedullary junction and pituitary are within normal limits.  Stable and largely unremarkable for age gray and white matter signal. There is an unchanged tiny chronic micro hemorrhage in the right cerebellum on series 10, image 20. 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 No cortical encephalomalacia.  Increased bilateral mastoid effusions, but mild. New sphenoid sinus fluid levels and increased paranasal sinus mucosal thickening. Stable orbits soft tissues. Visualized scalp soft tissues are within normal limits. Normal bone marrow signal.  MRA HEAD FINDINGS  Antegrade flow in the posterior circulation with normal distal vertebral arteries and normal PICA origins. Normal vertebrobasilar junction. No basilar artery stenosis. Normal AICA and SCA origins. Fetal type bilateral PCA origins, more so the right. Right PCA branches are  within normal limits.  There is focal moderate to severe irregularity and moderate stenosis of the left PCA P2 segment (series 307, image 5) with preserved distal flow.  Antegrade flow in both ICA siphons. Mild siphon irregularity without stenosis. Bilateral ophthalmic and posterior communicating artery origins are within normal limits. Patent carotid termini. Mild left ICA terminus MCA and ACA origin irregularity without origin stenosis.  Anterior communicating artery and visualized bilateral ACA branches are within normal limits. Visualized right MCA branches are within normal limits. Visualized left MCA branches are within normal limits. No left MCA branch stenosis or occlusion identified. Infundibulum of the left lenticulostriate vessels.  IMPRESSION: 1. No acute intracranial abnormality. Minimal to mild for age chronic small vessel disease. 2. Irregularity at the left ICA terminus affecting both the left MCA and ACA origins likely due to atherosclerosis. No anterior circulation hemodynamically significant stenosis or major branch occlusion. 3. 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   Electronically Signed   By: Genevie Ann M.D.   On: 11/07/2014 21:32   Mr Jodene Nam Head/brain Wo Cm  11/07/2014   CLINICAL DATA:  64 year old female with word finding difficulty, confusion. Symptoms resolved in the emergency department. TIA. Initial encounter.  EXAM: MRI HEAD WITHOUT CONTRAST  MRA HEAD WITHOUT CONTRAST  TECHNIQUE: Multiplanar, multiecho pulse sequences of the brain and surrounding structures were obtained without intravenous contrast. Angiographic images of the head were obtained using MRA technique without contrast.  COMPARISON:  Head CT without contrast 1548 hours today. Brain MRI 04/26/2014.  FINDINGS: MRI HEAD FINDINGS  Major intracranial vascular flow voids are stable. Cerebral volume is within normal limits for age. No restricted diffusion to  suggest acute infarction. No midline shift, mass effect, evidence of mass lesion, ventriculomegaly, extra-axial collection or acute intracranial hemorrhage. Cervicomedullary junction and pituitary are within normal limits.  Stable and largely unremarkable for age gray and white matter signal. There is an unchanged tiny chronic micro hemorrhage in the right cerebellum on series 10, image 20. 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 No cortical encephalomalacia.  Increased bilateral mastoid effusions, but mild. New sphenoid sinus fluid levels and increased paranasal sinus mucosal thickening. Stable orbits soft tissues. Visualized scalp soft tissues are within normal limits. Normal bone marrow signal.  MRA HEAD FINDINGS  Antegrade flow in the posterior circulation with normal distal vertebral arteries and normal PICA origins. Normal vertebrobasilar junction. No basilar artery stenosis. Normal AICA and SCA origins. Fetal type bilateral PCA origins, more so the right. Right PCA branches are within normal limits.  There is focal moderate to severe irregularity and moderate stenosis of the left PCA P2 segment (series 307, image 5) with preserved distal flow.  Antegrade flow in both ICA siphons. Mild siphon irregularity without stenosis. Bilateral ophthalmic and posterior communicating artery origins are within normal limits. Patent carotid termini. Mild left ICA terminus MCA and ACA origin irregularity without origin stenosis.  Anterior communicating artery and visualized bilateral ACA branches are within normal limits. Visualized right MCA branches are within normal limits. Visualized left MCA branches are within normal limits. No left MCA branch stenosis or occlusion identified. Infundibulum of the left lenticulostriate vessels.  IMPRESSION: 1. No acute intracranial abnormality. Minimal to mild for age chronic small vessel disease. 2. Irregularity at the left ICA terminus affecting both the left MCA and ACA origins  likely due to atherosclerosis. No anterior circulation hemodynamically significant stenosis or major branch occlusion. 3. 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   Electronically Signed   By: Genevie Ann M.D.   On: 11/07/2014 21:32    Microbiology: No results found for this or any previous visit (from the past 240 hour(s)).   Labs: Basic Metabolic Panel:  Recent Labs Lab 11/07/14 1545 11/07/14 1609  NA 139 140  K 4.8 4.7  CL 104 103  CO2 26  --   GLUCOSE 208* 213*  BUN 30* 31*  CREATININE 1.13* 1.20*  CALCIUM 9.5  --    Liver Function Tests:  Recent Labs Lab 11/07/14 1545  AST 18  ALT 17  ALKPHOS 61  BILITOT 0.7  PROT 6.8  ALBUMIN 3.1*   No results for input(s): LIPASE, AMYLASE in the last 168 hours. No results for input(s): AMMONIA in the last 168 hours. CBC:  Recent Labs Lab 11/07/14 1545 11/07/14 1609  WBC 12.8*  --   NEUTROABS 9.4*  --   HGB 12.6 13.6  HCT 38.3 40.0  MCV 84.9  --   PLT 247  --    Cardiac Enzymes: No results for input(s): CKTOTAL, CKMB, CKMBINDEX, TROPONINI in the last 168 hours. BNP: BNP (last 3 results) No results for input(s): BNP in the last 8760 hours.  ProBNP (last 3 results) No results for input(s): PROBNP in the last 8760 hours.  CBG:  Recent Labs Lab 11/08/14 1622 11/08/14 2120 11/08/14 2338 11/09/14 0643 11/09/14 1150  GLUCAP 233* 177* 142* 217* 215*       Signed:  Suezette Lafave  [facility] 11/09/2014, 1:40 PM

## 2014-11-10 ENCOUNTER — Ambulatory Visit: Payer: Commercial Managed Care - HMO | Admitting: Dietician

## 2014-11-10 LAB — HEMOGLOBIN A1C
Hgb A1c MFr Bld: 9.4 % — ABNORMAL HIGH (ref 4.8–5.6)
Mean Plasma Glucose: 223 mg/dL

## 2014-11-11 ENCOUNTER — Telehealth: Payer: Self-pay | Admitting: Family Medicine

## 2014-11-11 NOTE — Telephone Encounter (Signed)
No samples available  She had an episode last week - she was saying things backwards and she had confusion and weakness. They took her by EMS to [facility] --- they called it a mini stroke. All tests were normal per husband. She has started back asa 81 and will monitor.

## 2014-11-11 NOTE — Telephone Encounter (Signed)
I was aware of the hospital visit for a mini stroke. Please give her samples of Livalo if we have any

## 2014-11-11 NOTE — Progress Notes (Signed)
UR complete.  Shuree Brossart RN, MSN 

## 2014-11-12 ENCOUNTER — Telehealth: Payer: Self-pay | Admitting: Family Medicine

## 2014-11-12 NOTE — Telephone Encounter (Signed)
Pt given appt with [facility] 4/4 at 4:30.

## 2014-11-17 ENCOUNTER — Ambulatory Visit (INDEPENDENT_AMBULATORY_CARE_PROVIDER_SITE_OTHER): Payer: Commercial Managed Care - HMO | Admitting: Family Medicine

## 2014-11-17 ENCOUNTER — Encounter: Payer: Self-pay | Admitting: Family Medicine

## 2014-11-17 VITALS — BP 161/85 | HR 75 | Temp 97.2°F | Ht 67.0 in | Wt 198.0 lb

## 2014-11-17 DIAGNOSIS — Z09 Encounter for follow-up examination after completed treatment for conditions other than malignant neoplasm: Secondary | ICD-10-CM | POA: Diagnosis not present

## 2014-11-17 DIAGNOSIS — G459 Transient cerebral ischemic attack, unspecified: Secondary | ICD-10-CM | POA: Diagnosis not present

## 2014-11-17 NOTE — Progress Notes (Signed)
Subjective:    Patient ID: [name], female    DOB: 10-29-1950, 64 y.o.   MRN: [id number]  HPI Patient here today for hospital follow up where she was seen for TIA. She is accompanied today by her husband. The visit to the emergency room was on March 26. She had a negative CT scan at that time. She was seen by neurology. The MRA showed some atherosclerotic changes but none that were flow limiting. She has a follow-up visit there is in the works with the neurologist. She is also supposed to see the cardiologist for routine follow-up. She has had no further symptoms like she experienced prior to her visit in the hospital. She is taking an adult aspirin once a day. The patient is alert today and acting as her usual self.         Patient Active Problem List   Diagnosis Date Noted  . TIA (transient ischemic attack) 11/07/2014  . 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 CKD stage 3 due to type 2 diabetes mellitus 06/03/2014  . Type II diabetes mellitus with renal manifestations 06/03/2014  . Type II diabetes mellitus with peripheral circulatory disorder 06/03/2014  . Obesity (BMI 30.0-34.9) 04/24/2014  . Dizziness and giddiness 04/14/2014  . 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 Osteopenia 10/09/2013  . Nonhealing nonsurgical wound 09/17/2013  . Abnormal ankle brachial index 09/11/2013  . Hypomagnesemia 08/01/2013  . DYSPNEA 08/18/2010  . MYOCARDIAL PERFUSION SCAN, WITH STRESS TEST, ABNORMAL 08/04/2010  . ELECTROCARDIOGRAM, ABNORMAL 07/28/2010  . Edema extremities 12/30/2008  . Hyperlipidemia 12/26/2008  . Benign essential HTN 12/26/2008  . CAD 12/26/2008  . PERICARDIAL EFFUSION 12/26/2008  . GERD 12/26/2008  . OSTEOARTHRITIS 12/26/2008   Outpatient Encounter Prescriptions as of 11/17/2014  Medication Sig  . ACCU-CHEK AVIVA PLUS test strip CHECK BLOOD SUGAR UP TO TWICE DAILY OR AS INSTRUCTED  . aspirin 325 MG tablet Take 325 mg by mouth every 6 (six) hours  as needed for headache.  . B Complex Vitamins (B-COMPLEX/B-12 SL) Place 500 mcg under the tongue every morning.  . B-D INS SYR ULTRAFINE 1CC/31G 31G X 5/16" 1 ML MISC USE TO INJECT INSULIN TWICE A DAY AS INSTRUCTED  . calcium carbonate (OS-CAL) 1250 MG chewable tablet Chew 1 tablet by mouth 3 (three) times daily.  . cetirizine (ZYRTEC) 10 MG tablet Take 10 mg by mouth as needed for allergies.  Marland Kitchen insulin glargine (LANTUS) 100 UNIT/ML injection Inject up to 30 units twice a day  . Insulin Pen Needle (B-D ULTRAFINE III SHORT PEN) 31G X 8 MM MISC Use to administer insulin daily  . losartan (COZAAR) 25 MG tablet Take 1 tablet (25 mg total) by mouth daily.  . metFORMIN (GLUCOPHAGE) 1000 MG tablet 1 tab daily as directed (Patient taking differently: Take 500 mg by mouth 2 (two) times daily with a meal. 1 tab daily as directed)  . metoprolol (LOPRESSOR) 50 MG tablet Take 0.5 tablets (25 mg total) by mouth 2 (two) times daily.  Marland Kitchen MOVIPREP 100 G SOLR Take 1 kit (200 g total) by mouth as directed.  . Multiple Vitamin (MULTIVITAMIN WITH MINERALS) TABS tablet Take 1 tablet by mouth 2 (two) times daily.  . Pitavastatin Calcium 4 MG TABS Take 1 tablet (4 mg total) by mouth daily.  . sitaGLIPtin (JANUVIA) 100 MG tablet Take 1 tablet (100 mg total) by mouth daily with breakfast. As directed (Patient taking differently: Take 50 mg by mouth daily with breakfast. As  directed)  . sodium chloride (OCEAN) 0.65 % SOLN nasal spray Place 1 spray into both nostrils as needed for congestion.     Review of Systems  Eyes: Negative.   Respiratory: Negative.   Cardiovascular: Negative.   Gastrointestinal: Negative.   Endocrine: Negative.   Genitourinary: Negative.   Musculoskeletal: Negative.   Skin: Negative.   Allergic/Immunologic: Negative.   Neurological: Positive for weakness and headaches (slight  and eyes feel strained).  Hematological: Negative.   Psychiatric/Behavioral: Negative.        Objective:    Physical Exam  Constitutional: She is oriented to person, place, and time. She appears well-developed and well-nourished. No distress.  HENT:  Head: Normocephalic and atraumatic.  Right Ear: External ear normal.  Left Ear: External ear normal.  Nose: Nose normal.  Mouth/Throat: Oropharynx is clear and moist.  Eyes: Conjunctivae and EOM are normal. Pupils are equal, round, and reactive to light. Right eye exhibits no discharge. Left eye exhibits no discharge. No scleral icterus.  Neck: Normal range of motion. Neck supple. No thyromegaly present.  Cardiovascular: Normal rate and regular rhythm.   Murmur heard. At 72/m  Pulmonary/Chest: Effort normal and breath sounds normal. No respiratory distress. She has no wheezes. She has no rales. She exhibits no tenderness.  Abdominal: Soft. Bowel sounds are normal. She exhibits no mass. There is no tenderness. There is no rebound and no guarding.  Obese without masses or tenderness  Musculoskeletal: Normal range of motion. She exhibits no edema.  The patient had good strength bilaterally with both upper and lower extremity  Lymphadenopathy:    She has no cervical adenopathy.  Neurological: She is alert and oriented to person, place, and time. She has normal reflexes. No cranial nerve deficit.  Skin: Skin is warm and dry. No rash noted.  Dry skin in general  Psychiatric: She has a normal mood and affect. Her behavior is normal. Judgment and thought content normal.  Nursing note and vitals reviewed.  BP 161/85 mmHg  Pulse 75  Temp(Src) 97.2 F (36.2 C) (Oral)  Ht 5' 7" (1.702 m)  Wt 198 lb (89.812 kg)  BMI 31.00 kg/m2  Repeat blood pressure was 148/78 in  the right arm supine      Assessment & Plan:  1. Hospital discharge follow-up -The patient has been stable since her hospital discharge with no further symptoms of TIA - BMP8+EGFR - Ambulatory referral to Cardiology - Ambulatory referral to Neurology  2. Transient cerebral ischemia,  unspecified transient cerebral ischemia type -We will arrange for follow-up with the neurologist and she is due for her cardiology follow-up. -She should continue with the adult aspirin one daily - Ambulatory referral to Cardiology - Ambulatory referral to Neurology  No orders of the defined types were placed in this encounter.   Patient Instructions  We will arrange for you to see the cardiologist in follow-up and the neurologist as directed and follow-up Check blood pressure readings at home and bring these by for review in a couple weeks and also take the readings with you to see the cardiologist and Watch sodium intake Drink plenty of fluids   Don W. Moore MD   

## 2014-11-17 NOTE — Patient Instructions (Signed)
We will arrange for you to see the cardiologist in follow-up and the neurologist as directed and follow-up Check blood pressure readings at home and bring these by for review in a couple weeks and also take the readings with you to see the cardiologist and Watch sodium intake Drink plenty of fluids

## 2014-11-18 ENCOUNTER — Encounter: Payer: Self-pay | Admitting: Gastroenterology

## 2014-11-18 LAB — BMP8+EGFR
BUN/Creatinine Ratio: 29 — ABNORMAL HIGH (ref 11–26)
BUN: 29 mg/dL — ABNORMAL HIGH (ref 8–27)
CO2: 25 mmol/L (ref 18–29)
Calcium: 10.2 mg/dL (ref 8.7–10.3)
Chloride: 102 mmol/L (ref 97–108)
Creatinine, Ser: 1 mg/dL (ref 0.57–1.00)
GFR calc Af Amer: 69 mL/min/{1.73_m2} (ref >59)
GFR calc non Af Amer: 60 mL/min/{1.73_m2} (ref >59)
Glucose: 235 mg/dL — ABNORMAL HIGH (ref 65–99)
Potassium: 5 mmol/L (ref 3.5–5.2)
Sodium: 142 mmol/L (ref 134–144)

## 2014-11-19 ENCOUNTER — Encounter: Payer: Self-pay | Admitting: Gastroenterology

## 2014-11-19 ENCOUNTER — Telehealth: Payer: Self-pay | Admitting: Family Medicine

## 2014-11-20 ENCOUNTER — Other Ambulatory Visit: Payer: Self-pay | Admitting: *Deleted

## 2014-11-20 DIAGNOSIS — E118 Type 2 diabetes mellitus with unspecified complications: Secondary | ICD-10-CM

## 2014-11-20 DIAGNOSIS — Z9884 Bariatric surgery status: Secondary | ICD-10-CM

## 2014-11-20 MED ORDER — LOVASTATIN 40 MG PO TABS: 40.0000 mg | ORAL_TABLET | Freq: Every day | ORAL | Status: DC

## 2014-11-20 NOTE — Telephone Encounter (Signed)
Insurance will not pay for Livalo and her insurance will pay for lovastatin can you please look at changing.

## 2014-11-20 NOTE — Telephone Encounter (Signed)
Got a Dr Jannifer Franklin appt - has not heard from [place] changed

## 2014-11-20 NOTE — Telephone Encounter (Signed)
Change cholesterol medicine to lovastatin 40 and find out what she needs regarding her appointment with Dr. Percival Spanish

## 2014-11-24 ENCOUNTER — Encounter: Payer: Commercial Managed Care - HMO | Attending: Surgery | Admitting: Dietician

## 2014-11-24 VITALS — Ht 67.0 in | Wt 194.0 lb

## 2014-11-24 DIAGNOSIS — E669 Obesity, unspecified: Secondary | ICD-10-CM | POA: Diagnosis not present

## 2014-11-24 DIAGNOSIS — Z48815 Encounter for surgical aftercare following surgery on the digestive system: Secondary | ICD-10-CM | POA: Insufficient documentation

## 2014-11-24 DIAGNOSIS — Z6830 Body mass index (BMI) 30.0-30.9, adult: Secondary | ICD-10-CM | POA: Diagnosis not present

## 2014-11-24 DIAGNOSIS — Z9884 Bariatric surgery status: Secondary | ICD-10-CM | POA: Insufficient documentation

## 2014-11-24 DIAGNOSIS — Z713 Dietary counseling and surveillance: Secondary | ICD-10-CM | POA: Diagnosis not present

## 2014-11-24 NOTE — Patient Instructions (Addendum)
-  Watch portions of nuts! -Have as many non-starchy vegetables as you want -Limit carbs to 15 grams per meal -Always have something with protein for lunch (even if it's just a protein shake) -Work on getting 64 oz of fluid per day   02/04/14 03/18/14 04/29/14 07/29/14 11/24/14   BMI (kg/m^2) 34.7 33 31.7 30.4 30.4   Fat Mass (lbs) 103 94.5 88.5 81 78   Fat Free Mass (lbs) 122 116 114 113 116   Total Body Water (lbs) 89.5 85 83.5 82.5 85

## 2014-11-24 NOTE — Progress Notes (Signed)
  Follow-up visit:  10 months Post-Operative Gastric sleeve Surgery  Medical Nutrition Therapy:  Appt start time: 230 end time:  300  Primary concerns today: Post-operative Bariatric Surgery Nutrition Management.   [name] returns today having lost 3 lbs of fat. She was recently hospitalized after having a TIA. She also recently had a stomach virus. Her HgbA1c has increased and her medications are currently being managed.   Surgery date: 01/20/2014 Surgery type: Gastric sleeve Start weight at [facility]: 251 on 10/02/13 (257 lbs per patient) Weight today: 194 lbs Weight change: 0 lbs (3 lbs fat loss) Total weight lost: 63 lbs  Weight loss goal: 150-160 lbs  TANITA  BODY COMP RESULTS Patient unable to use Tanita at pre op class  02/04/14 03/18/14 04/29/14 07/29/14 11/24/14   BMI (kg/m^2) 34.7 33 31.7 30.4 30.4   Fat Mass (lbs) 103 94.5 88.5 81 78   Fat Free Mass (lbs) 122 116 114 113 116   Total Body Water (lbs) 89.5 85 83.5 82.5 85    Preferred Learning Style:   No preference indicated   Learning Readiness:   Ready  24-hr recall: B (8:30AM): grilled pork chop or bacon with 1 egg and 2 slices of tomato, coffee Snk (AM):  L (2 PM): 3-meat club from [employer] without the bread OR hamburger without bun OR cashew and a piece of SF candy Snk (PM):  D (PM): meat with beans or vegetables, sometimes mashed potatoes Snk (PM):   Fluid intake: 2 cups decaf coffee, crystal light; patient unsure of total amount Estimated total protein intake: 60g+ per day  Medications: see list Supplementation: taking  CBG monitoring: 1x in the am Average CBG per patient: 140-160 mg/dL fasting in the am Last HgbA1c: 9.4% on 11/08/14  Using straws: once in a while Drinking while eating: no Hair loss: no (dry scalp) Carbonated beverages: no N/V/D/C: constipation  Dumping syndrome: none  Recent physical activity:  None  Progress Towards Goal(s):  In progress.   Nutritional Diagnosis:  [address]-3.3  Overweight/obesity related to past poor dietary habits and physical inactivity as evidenced by patient w/ recent gastric sleeve surgery following dietary guidelines for continued weight loss.     Intervention:  Nutrition counseling provided. Encouraged the patient to choose leaner meats and discussed examples.   Handouts provided:  -Pre op diet -Phase 3B lean protein + non starchy vegetables  Teaching Method Utilized:  Visual Auditory  Barriers to learning/adherence to lifestyle change: toe injury limiting exercise; vertigo  Demonstrated degree of understanding via:  Teach Back   Monitoring/Evaluation:  Dietary intake, exercise, and body weight. Follow up in 4 months for 14 month post-op visit.

## 2014-11-26 ENCOUNTER — Encounter: Payer: Self-pay | Admitting: Cardiology

## 2014-11-26 ENCOUNTER — Ambulatory Visit (INDEPENDENT_AMBULATORY_CARE_PROVIDER_SITE_OTHER): Payer: Commercial Managed Care - HMO | Admitting: Cardiology

## 2014-11-26 VITALS — BP 140/60 | HR 80 | Ht 67.0 in | Wt 199.0 lb

## 2014-11-26 DIAGNOSIS — G459 Transient cerebral ischemic attack, unspecified: Secondary | ICD-10-CM | POA: Diagnosis not present

## 2014-11-26 NOTE — Progress Notes (Signed)
HPI The patient presents for evaluation of aortic stenosis.  She was recently hospitalized with possible TIA. I did review these records. Echocardiogram demonstrated moderate aortic stenosis with small stable pericardial effusion. Carotids were normal. There were no acute findings and she completely resolved. Since going home she's had none of the symptoms that prompted that hospitalization. She's felt her heart beating hard at times and has had a heart rate is higher than normal but no since it has been irregular. I reviewed recent records and her blood pressure is coming down her heart rate has been actually well controlled over several readings. She does not feel like she's having any paroxysms of arrhythmia. She denies any shortness of breath, PND or orthopnea. She's had no chest pressure, neck or arm discomfort. She's had no weight gain or edema.  Allergies  Allergen Reactions  . Ezetimibe-Simvastatin Other (See Comments)    Leg cramps  . Celebrex [Celecoxib] Rash  . Levemir [Insulin Detemir] Rash  . Penicillins Other (See Comments)    SYNCOPE (Tolerated Ancef)    Current Outpatient Prescriptions  Medication Sig Dispense Refill  . aspirin 325 MG tablet Take 325 mg by mouth every 6 (six) hours as needed for headache.    . B Complex Vitamins (B-COMPLEX/B-12 SL) Place 500 mcg under the tongue every morning.    . calcium carbonate (OS-CAL) 1250 MG chewable tablet Chew 1 tablet by mouth 3 (three) times daily.    . cetirizine (ZYRTEC) 10 MG tablet Take 10 mg by mouth as needed for allergies.    Marland Kitchen losartan (COZAAR) 25 MG tablet Take 1 tablet (25 mg total) by mouth daily. 90 tablet 1  . lovastatin (MEVACOR) 40 MG tablet Take 1 tablet (40 mg total) by mouth at bedtime. 90 tablet 3  . metFORMIN (GLUCOPHAGE) 1000 MG tablet 1 tab daily as directed (Patient taking differently: Take 500 mg by mouth 2 (two) times daily with a meal. 1 tab daily as directed) 90 tablet 3  . metoprolol (LOPRESSOR) 50 MG  tablet Take 0.5 tablets (25 mg total) by mouth 2 (two) times daily. 90 tablet 3  . Multiple Vitamin (MULTIVITAMIN WITH MINERALS) TABS tablet Take 1 tablet by mouth 2 (two) times daily.    . sitaGLIPtin (JANUVIA) 100 MG tablet Take 1 tablet (100 mg total) by mouth daily with breakfast. As directed (Patient taking differently: Take 50 mg by mouth daily with breakfast. As directed) 90 tablet 3  . sodium chloride (OCEAN) 0.65 % SOLN nasal spray Place 1 spray into both nostrils as needed for congestion. 15 mL 0  . ACCU-CHEK AVIVA PLUS test strip CHECK BLOOD SUGAR UP TO TWICE DAILY OR AS INSTRUCTED 100 each 0  . B-D INS SYR ULTRAFINE 1CC/31G 31G X 5/16" 1 ML MISC USE TO INJECT INSULIN TWICE A DAY AS INSTRUCTED 100 each 2  . insulin glargine (LANTUS) 100 UNIT/ML injection Inject up to 30 units twice a day 30 mL 4  . Insulin Pen Needle (B-D ULTRAFINE III SHORT PEN) 31G X 8 MM MISC Use to administer insulin daily 100 each 1  . MOVIPREP 100 G SOLR Take 1 kit (200 g total) by mouth as directed. (Patient not taking: Reported on 11/26/2014) 1 kit 0  . [DISCONTINUED] Olmesartan-Amlodipine-HCTZ (TRIBENZOR) 40-5-12.5 MG TABS Take 1 tablet by mouth daily.       No current facility-administered medications for this visit.    Past Medical History  Diagnosis Date  . 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 Hyperlipidemia   . Cystocele   . IBS (irritable bowel syndrome)   . Varicose veins   . Superficial phlebitis   . Esophageal stricture   . Fatty liver   . Osteopenia   . Vitamin D deficiency   . Dyslipidemia   . GERD (gastroesophageal reflux disease)   . Osteoarthritis   . Pericardial effusion     a. Mod by echo 2012 at time of PNA. b. Echo 07/2012: small pericardial effusion vs fat.  . Hypertension     dr Percival Spanish  . Obesity   . CAD (coronary artery disease)     a. Nonobstructive by cath 2006 - 25% LAD, 25-30% after 1st diag, distal 25% PDA, minor irreg LCx. b. Normal nuc 2011.  Marland Kitchen  Hiatal hernia     a. 07/2013: HH with small stricture holding up barium tablet (concides with patient's sx of food sticking).  . 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  . Morbid obesity   . Abnormal Doppler ultrasound of carotid artery     a. 05/2013 - mild plaque, 1-39% BICA, abnormal appearrance of thyroid (TSH wnl in 06/2013).  . Palpitations     a. PVC/PAC on EKG 08/2013.  Marland Kitchen Sleep apnea     CPAP currently broke, calling company about machine, pt does not know cpap settings, uses cpap off and on  . Family history of anesthesia complication     daughter slow to awaken after teeth extraction at age 88  . Toe ulcer 01-08-14    Left big toe-being tx. at wound center "is healing"  . PONV (postoperative nausea and vomiting)     n/v, and room spinning after cataract and parotid surgery  . Difficult intubation 09-07-2012    big neck trouble with intubation parotid surgery"takes little med to sedate"  . Dizziness and giddiness 04/14/2014    Past Surgical History  Procedure Laterality Date  . Carpal tunnel release  5 yrs ago    Left  . Cataract extraction Bilateral yrs ago  . Cardiac catheterization      06  . Parotidectomy  09/07/2012    Procedure: PAROTIDECTOMY;  Surgeon: Melida Quitter, MD;  Location: [facility];  Service: ENT;  Laterality: Left;  LEFT PAROTIDECTOMY  . Breath tek h pylori N/A 07/29/2013    Procedure: BREATH TEK H PYLORI;  Surgeon: Shann Medal, MD;  Location: [facility];  Service: General;  Laterality: N/A;  . Tubal ligation  yrs ago  . Tonsillectomy  1968  . Cholecystectomy  1982  . Esophagogastroduodenoscopy N/A 12/27/2013    Procedure: ESOPHAGOGASTRODUODENOSCOPY (EGD);  Surgeon: Shann Medal, MD;  Location: [facility];  Service: General;  Laterality: N/A;  . Laparoscopic gastric sleeve resection N/A 01/20/2014    Procedure: LAPAROSCOPIC GASTRIC SLEEVE RESECTION;  Surgeon: Shann Medal, MD;  Location: [facility];  Service: General;  Laterality: N/A;    . Upper gi endoscopy  01/20/2014    Procedure: UPPER GI ENDOSCOPY;  Surgeon: Shann Medal, MD;  Location: [facility];  Service: General;;    ROS:  As stated in the HPI and negative for all other systems.  PHYSICAL EXAM BP 140/60 mmHg  Pulse 80  Ht _0  (1.702 m)  Wt 199 lb (90.266 kg)  BMI 31.16 kg/m2 GENERAL:  Well appearing NECK:  No jugular venous distention, waveform within normal limits, carotid upstroke brisk and symmetric, no bruits, positive transmitted systolic murmur no thyromegaly LUNGS:  Clear to auscultation bilaterally CHEST:  Unremarkable HEART:  PMI not displaced or sustained,S1 and S2 within normal limits, no S3, no S4, no clicks, no rubs, apical early peaking systolic murmur ABD:  Flat, positive bowel sounds normal in frequency in pitch, no bruits, no rebound, no guarding, no midline pulsatile mass, no hepatomegaly, no splenomegaly, obese EXT:  2 plus pulses throughout, positive mild edema, no cyanosis no clubbing   ASSESSMENT AND PLAN  AORTIC STENOSIS - This is moderate and not causing any symptoms. I will follow clinically  DYSPNEA -  Her dyspnea does seem to be at baseline.   HYPERTENSION -  Her blood pressure is at target.  Obesity -  She has done well following bariatric surgery but would like to lose more weight.  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 No change in therapy is indicated.   PERICARDIAL EFFUSION - This has been mild.  No change in therapy or further work up is indicated.   PALPITATIONS - She was not describing an irregular heartbeat but rather a strong heartbeat. I don't suspect dysrhythmia. She will let me know however if she has any symptoms going for at which point she might need to monitor.

## 2014-11-26 NOTE — Patient Instructions (Signed)
The current medical regimen is effective;  continue present plan and medications.  Follow up in 1 year with Dr. Hochrein in [place].  You will receive a letter in the mail 2 months before you are due.  Please call us when you receive this letter to schedule your follow up appointment.  Thank you for choosing [facility]!!     

## 2014-11-28 ENCOUNTER — Encounter: Payer: Self-pay | Admitting: *Deleted

## 2014-12-04 ENCOUNTER — Encounter: Payer: Commercial Managed Care - HMO | Admitting: Gastroenterology

## 2014-12-08 ENCOUNTER — Encounter: Payer: Self-pay | Admitting: Neurology

## 2014-12-08 ENCOUNTER — Ambulatory Visit (INDEPENDENT_AMBULATORY_CARE_PROVIDER_SITE_OTHER): Payer: Commercial Managed Care - HMO | Admitting: Neurology

## 2014-12-08 VITALS — BP 149/70 | HR 78 | Ht 67.0 in | Wt 198.9 lb

## 2014-12-08 DIAGNOSIS — R4701 Aphasia: Secondary | ICD-10-CM | POA: Insufficient documentation

## 2014-12-08 NOTE — Progress Notes (Signed)
Reason for visit: possible TIA  Referring physician: [facility]  [name] [name] is a 64 y.o. female  History of present illness:  [name] [name] is a 64 year old right-handed white female with a history of diabetes and a prior history of vertigo. The patient was seen through this office about one year ago with vertigo. The patient has not had any significant issues regarding this since that time. The patient was recently in the hospital on 11/07/2014. The patient had about 2 weeks of some problems with headaches on the vertex of the head prior to this. The patient was noted to have problems with generalized confusion, difficulty with mixing up words, and difficulty processing information. The patient had some sinus issues around the time of onset of the headache, and she was attributing the headache to this. The patient was sent to the hospital for evaluation of aphasia. This cleared in the first 24 hours. MRI of the brain did not show an acute stroke. MRA of the head showed some stenosis of the left P2 segment. The patient had not been on aspirin prior to admission, she is on aspirin at this time. The patient did have some nausea and vomiting associated with a headache while in the hospital. The patient has chronic numbness of the feet and hands associated with her diabetes, no new changes in this regard. The patient did not report any weakness of the extremities or weakness in the face with the aphasia. The patient returns to office today for an evaluation. She is at a sign currently. Blood sugar was around 180 at the time she was sent to the hospital.  Past Medical History  Diagnosis Date  . 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 Hyperlipidemia   . Cystocele   . IBS (irritable bowel syndrome)   . Varicose veins   . Superficial phlebitis   . Esophageal stricture   . Fatty liver   . Osteopenia   . Vitamin D deficiency   . Dyslipidemia   . GERD  (gastroesophageal reflux disease)   . Osteoarthritis   . Pericardial effusion     a. Mod by echo 2012 at time of PNA. b. Echo 07/2012: small pericardial effusion vs fat.  . Hypertension     dr Percival Spanish  . Obesity   . CAD (coronary artery disease)     a. Nonobstructive by cath 2006 - 25% LAD, 25-30% after 1st diag, distal 25% PDA, minor irreg LCx. b. Normal nuc 2011.  Marland Kitchen Hiatal hernia     a. 07/2013: HH with small stricture holding up barium tablet (concides with patient's sx of food sticking).  . 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  . Morbid obesity   . Abnormal Doppler ultrasound of carotid artery     a. 05/2013 - mild plaque, 1-39% BICA, abnormal appearrance of thyroid (TSH wnl in 06/2013).  . Palpitations     a. PVC/PAC on EKG 08/2013.  Marland Kitchen Sleep apnea     CPAP currently broke, calling company about machine, pt does not know cpap settings, uses cpap off and on  . Family history of anesthesia complication     daughter slow to awaken after teeth extraction at age 110  . Toe ulcer 01-08-14    Left big toe-being tx. at wound center "is healing"  . PONV (postoperative nausea and vomiting)     n/v, and room spinning after cataract and  parotid surgery  . Difficult intubation 09-07-2012    big neck trouble with intubation parotid surgery"takes little med to sedate"  . Dizziness and giddiness 04/14/2014  . Fracture, skull     Past Surgical History  Procedure Laterality Date  . Carpal tunnel release  5 yrs ago    Left  . Cataract extraction Bilateral yrs ago  . Cardiac catheterization      06  . Parotidectomy  09/07/2012    Procedure: PAROTIDECTOMY;  Surgeon: Melida Quitter, MD;  Location: [facility];  Service: ENT;  Laterality: Left;  LEFT PAROTIDECTOMY  . Breath tek h pylori N/A 07/29/2013    Procedure: BREATH TEK H PYLORI;  Surgeon: Shann Medal, MD;  Location: [facility];  Service: General;  Laterality: N/A;  . Tubal ligation  yrs ago  . Tonsillectomy  1968  .  Cholecystectomy  1982  . Esophagogastroduodenoscopy N/A 12/27/2013    Procedure: ESOPHAGOGASTRODUODENOSCOPY (EGD);  Surgeon: Shann Medal, MD;  Location: [facility];  Service: General;  Laterality: N/A;  . Laparoscopic gastric sleeve resection N/A 01/20/2014    Procedure: LAPAROSCOPIC GASTRIC SLEEVE RESECTION;  Surgeon: Shann Medal, MD;  Location: [facility];  Service: General;  Laterality: N/A;  . Upper gi endoscopy  01/20/2014    Procedure: UPPER GI ENDOSCOPY;  Surgeon: Shann Medal, MD;  Location: [facility];  Service: General;;    Family History  Problem Relation Age of Onset  . Heart failure Mother   . Hypertension Mother   . Skin cancer Mother   . Colitis Mother   . Diabetes Mother   . Heart disease Father   . Colon polyps Father   . Diabetes Father   . Diabetes Other   . Heart attack Sister     Social history:  reports that she has never smoked. She has never used smokeless tobacco. She reports that she does not drink alcohol or use illicit drugs.  Medications:  Prior to Admission medications   Medication Sig Start Date End Date Taking? Authorizing Provider  ACCU-CHEK AVIVA PLUS test strip CHECK BLOOD SUGAR UP TO TWICE DAILY OR AS INSTRUCTED 09/29/14  Yes Chipper Herb, MD  aspirin 325 MG tablet Take 325 mg by mouth every 6 (six) hours as needed for headache.   Yes Historical Provider, MD  B Complex Vitamins (B-COMPLEX/B-12 SL) Place 500 mcg under the tongue every morning.   Yes Historical Provider, MD  B-D INS SYR ULTRAFINE 1CC/31G 31G X 5/16" 1 ML MISC USE TO INJECT INSULIN TWICE A DAY AS INSTRUCTED 09/23/14  Yes Chipper Herb, MD  calcium carbonate (OS-CAL) 1250 MG chewable tablet Chew 1 tablet by mouth 3 (three) times daily.   Yes Historical Provider, MD  cetirizine (ZYRTEC) 10 MG tablet Take 10 mg by mouth as needed for allergies.   Yes Historical Provider, MD  insulin glargine (LANTUS) 100 UNIT/ML injection Inject up to 30 units twice a day 09/08/14  Yes Chipper Herb, MD    Insulin Pen Needle (B-D ULTRAFINE III SHORT PEN) 31G X 8 MM MISC Use to administer insulin daily 07/24/14  Yes Tammy Eckard, PHARMD  losartan (COZAAR) 25 MG tablet Take 1 tablet (25 mg total) by mouth daily. 08/04/14  Yes Tammy Eckard, PHARMD  lovastatin (MEVACOR) 40 MG tablet Take 1 tablet (40 mg total) by mouth at bedtime. 11/20/14  Yes Chipper Herb, MD  metFORMIN (GLUCOPHAGE) 1000 MG tablet 1 tab daily as directed Patient taking differently: Take 500 mg by  mouth 2 (two) times daily with a meal. 1 tab daily as directed 09/08/14  Yes Chipper Herb, MD  metoprolol (LOPRESSOR) 50 MG tablet Take 0.5 tablets (25 mg total) by mouth 2 (two) times daily. 09/08/14  Yes Chipper Herb, MD  MOVIPREP 100 G SOLR Take 1 kit (200 g total) by mouth as directed. 10/27/14  Yes Amy S Esterwood, PA-C  Multiple Vitamin (MULTIVITAMIN WITH MINERALS) TABS tablet Take 1 tablet by mouth 2 (two) times daily.   Yes Historical Provider, MD  sitaGLIPtin (JANUVIA) 100 MG tablet Take 1 tablet (100 mg total) by mouth daily with breakfast. As directed Patient taking differently: Take 50 mg by mouth daily with breakfast. As directed 09/08/14  Yes Chipper Herb, MD  sodium chloride (OCEAN) 0.65 % SOLN nasal spray Place 1 spray into both nostrils as needed for congestion. 11/09/14  Yes Domenic Polite, MD      Allergies  Allergen Reactions  . Ezetimibe-Simvastatin Other (See Comments)    Leg cramps  . Celebrex [Celecoxib] Rash  . Levemir [Insulin Detemir] Rash  . Penicillins Other (See Comments)    SYNCOPE (Tolerated Ancef)    ROS:  Out of a complete 14 system review of symptoms, the patient complains only of the following symptoms, and all other reviewed systems are negative.  Palpitations of the heart Ringing in the ears Loss of vision Incontinence of bowel, constipation Memory loss, numbness Runny nose, allergies  Blood pressure 149/70, pulse 78, height 5' 7" (1.702 m), weight 198 lb 14.4 oz (90.22 kg).  Physical  Exam  General: The patient is alert and cooperative at the time of the examination. The patient is moderately to markedly obese.  Eyes: Pupils are equal, round, and reactive to light. Discs are flat bilaterally.  Neck: The neck is supple, no carotid bruits are noted.  Respiratory: The respiratory examination is clear.  Cardiovascular: The cardiovascular examination reveals a regular rate and rhythm, no obvious murmurs or rubs are noted.  Skin: Extremities are without significant edema. There are varicose veins on the right leg.  Neurologic Exam  Mental status: The patient is alert and oriented x 3 at the time of the examination. The patient has apparent normal recent and remote memory, with an apparently normal attention span and concentration ability.  Cranial nerves: Facial symmetry is present. There is good sensation of the face to pinprick and soft touch bilaterally. The strength of the facial muscles and the muscles to head turning and shoulder shrug are normal bilaterally. Speech is well enunciated, no aphasia or dysarthria is noted. Extraocular movements are full. Visual fields are full. The tongue is midline, and the patient has symmetric elevation of the soft palate. No obvious hearing deficits are noted.  Motor: The motor testing reveals 5 over 5 strength of all 4 extremities. Good symmetric motor tone is noted throughout.  Sensory: Sensory testing is intact to pinprick, soft touch, vibration sensation, and position sense on all 4 extremities. No evidence of extinction is noted.  Coordination: Cerebellar testing reveals good finger-nose-finger and heel-to-shin bilaterally.  Gait and station: Gait is normal. Tandem gait is normal. Romberg is negative. No drift is seen.  Reflexes: Deep tendon reflexes are symmetric, but are depressed bilaterally. Toes are downgoing bilaterally.   MRI brain and MRA head 11/07/14:  IMPRESSION: 1. No acute intracranial abnormality. Minimal to  mild for age chronic small vessel disease. 2. Irregularity at the left ICA terminus affecting both the left MCA and ACA origins likely  due to atherosclerosis. No anterior circulation hemodynamically significant stenosis or major branch occlusion. 3. 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   * MRI scan images were reviewed online. I agree with the written report.   2D echo 11/08/14:  Study Conclusions  - Left ventricle: The cavity size was normal. Systolic function was normal. The estimated ejection fraction was in the range of 60% to 65%. Wall motion was normal; there were no regional wall motion abnormalities. Doppler parameters are consistent with abnormal left ventricular relaxation (grade 1 diastolic dysfunction). Doppler parameters are consistent with high ventricular filling pressure. Medial E/e&' 25.5. Mild to moderate concentric left ventricular hypertrophy. - Aortic valve: Mildly to moderately calcified annulus. Mildly thickened, moderately calcified leaflets. Valve mobility was restricted. There was moderate stenosis. Peak velocity 3.23 m/s. Mean gradient (S): 23 mm Hg. Valve area (VTI): 1.3 cm^2. - Mitral valve: Mildly calcified annulus. Mildly calcified leaflets . There was mild regurgitation. - Left atrium: The atrium was mildly dilated. - Atrial septum: No defect or patent foramen ovale was identified. - Tricuspid valve: There was trivial regurgitation. - Inferior vena cava: The vessel was dilated. The respirophasic diameter changes were blunted (< 50%), consistent with elevated central venous pressure. - Pericardium, extracardiac: A small to moderate size circumferential pericardial effusion was present. No right atrial or right ventricular diastolic collapse. No evidence for tamponade physiology.   Carotid doppler 11/08/14:  Summary:  - The vertebral  arteries appear patent with antegrade flow. - Findings consistent with 1-39 percent stenosis involving the right internal carotid artery and the left internal carotid artery.    Assessment/Plan:  1. Transient episode of aphasia  2. Diabetes  3. History of vertigo  The patient had a transient episode of aphasia that may have represented a TIA event, associated with headache. Migraine needs to be considered, but the patient has no real history of migraine in the past. The patient has been placed on aspirin, she will remain on this medication for now. The carotid Doppler study was unremarkable. She will follow-up through this office on an as-needed basis. If the events recur, EEG evaluation the future may be done.  Jill Alexanders MD 12/08/2014 7:54 PM  [facility] [address] [place], [place] [address]  Phone [phone] Fax [phone]

## 2014-12-08 NOTE — Patient Instructions (Signed)
Aphasia Aphasia is a neurological disorder caused by damage to the parts of the brain that control language. CAUSES  Aphasia is not a disease, but a symptom of brain damage. Aphasia is commonly seen in adults who have suffered a stroke. Aphasia also can result from:  A brain tumor.  Infection.  Head injury.  A rare type of dementia called Primary Progressive Aphasia. Common types of dementia may be associated with aphasia but can also exist without language problems. SYMPTOMS  Primary signs of the disorder include:  Problems expressing oneself when speaking.  Trouble understanding speech.  Difficulty with reading and writing.  Speaking in short or incomplete sentences.  Speaking in sentences that don't make sense.  Speaking unrecognizable words.  Interpreting figurative language literally.  Writing sentences that don't make sense. The type and severity of language problems depend on the precise location and extent of the damaged brain tissue. Aphasia can be divided into four broad categories:  Expressive aphasia - difficulty in conveying thoughts through speech or writing. The patient knows what they want to say, but cannot find the words they need.  Receptive aphasia - difficulty understanding spoken or written language. The patient hears the voice or sees the print but cannot make sense of the words.  Anomic or amnesia aphasia - difficulty in using the correct names for particular objects, people, places, or events. This is the least severe form of aphasia.  Global aphasia results from severe and extensive damage to the language areas of the brain. Patients lose almost all language function, both comprehension (understanding) and expression. They cannot speak, understand speech, read, or write. TREATMENT  Sometimes an individual will completely recover from aphasia without treatment. In most cases, language therapy should begin as soon as possible. Language therapy should  be tailored to the individual needs of the patient. Therapy with a speech pathologist involves exercises in which patients:  Read.  Write.  Follow directions.  Repeat what they hear.  Computer-aided therapy may also be used. PROGNOSIS  The outcome of aphasia is difficult to predict. People who are younger or have less extensive brain damage do better. The location of the injury is also important. The location is a clue to prognosis. In general, patients tend to recover skills in language comprehension (understanding) more completely than those skills involving expression (speaking or writing). Document Released: 04/23/2002 Document Revised: 10/24/2011 Document Reviewed: 10/21/2013 [facility] Patient Information 2015 [place], [place]. This information is not intended to replace advice given to you by your health care provider. Make sure you discuss any questions you have with your health care provider.

## 2014-12-09 ENCOUNTER — Institutional Professional Consult (permissible substitution): Payer: Commercial Managed Care - HMO | Admitting: Neurology

## 2015-01-08 ENCOUNTER — Ambulatory Visit (AMBULATORY_SURGERY_CENTER): Payer: Self-pay | Admitting: *Deleted

## 2015-01-08 VITALS — Ht 67.0 in | Wt 200.0 lb

## 2015-01-08 DIAGNOSIS — Z1211 Encounter for screening for malignant neoplasm of colon: Secondary | ICD-10-CM

## 2015-01-08 NOTE — Progress Notes (Signed)
Pt has just has TIA in March, pt needs to be at least 3 months out from episode, rescheduled to 03/06/15, pt is set for pre-visit on 02/23/15 at 2:30 pm. Had already started documenting in chart when learning about TIA.

## 2015-01-19 ENCOUNTER — Encounter: Payer: Self-pay | Admitting: Family Medicine

## 2015-01-19 ENCOUNTER — Ambulatory Visit: Payer: Commercial Managed Care - HMO | Admitting: Family Medicine

## 2015-01-29 ENCOUNTER — Encounter: Payer: Commercial Managed Care - HMO | Admitting: Gastroenterology

## 2015-01-30 ENCOUNTER — Ambulatory Visit (INDEPENDENT_AMBULATORY_CARE_PROVIDER_SITE_OTHER): Payer: Commercial Managed Care - HMO | Admitting: Family Medicine

## 2015-01-30 ENCOUNTER — Encounter: Payer: Self-pay | Admitting: Family Medicine

## 2015-01-30 VITALS — BP 148/71 | HR 79 | Temp 97.0°F | Ht 67.0 in | Wt 199.0 lb

## 2015-01-30 DIAGNOSIS — G459 Transient cerebral ischemic attack, unspecified: Secondary | ICD-10-CM | POA: Diagnosis not present

## 2015-01-30 DIAGNOSIS — E1151 Type 2 diabetes mellitus with diabetic peripheral angiopathy without gangrene: Secondary | ICD-10-CM

## 2015-01-30 DIAGNOSIS — H8303 Labyrinthitis, bilateral: Secondary | ICD-10-CM | POA: Diagnosis not present

## 2015-01-30 DIAGNOSIS — E1122 Type 2 diabetes mellitus with diabetic chronic kidney disease: Secondary | ICD-10-CM

## 2015-01-30 DIAGNOSIS — N183 Chronic kidney disease, stage 3 unspecified: Secondary | ICD-10-CM

## 2015-01-30 DIAGNOSIS — E559 Vitamin D deficiency, unspecified: Secondary | ICD-10-CM | POA: Diagnosis not present

## 2015-01-30 DIAGNOSIS — E785 Hyperlipidemia, unspecified: Secondary | ICD-10-CM | POA: Diagnosis not present

## 2015-01-30 DIAGNOSIS — E1159 Type 2 diabetes mellitus with other circulatory complications: Secondary | ICD-10-CM | POA: Diagnosis not present

## 2015-01-30 DIAGNOSIS — I10 Essential (primary) hypertension: Secondary | ICD-10-CM | POA: Diagnosis not present

## 2015-01-30 LAB — POCT CBC
Granulocyte percent: 65.5 %G (ref 37–80)
HCT, POC: 43.4 % (ref 37.7–47.9)
Hemoglobin: 13.7 g/dL (ref 12.2–16.2)
Lymph, poc: 2.6 (ref 0.6–3.4)
MCH, POC: 26.8 pg — AB (ref 27–31.2)
MCHC: 31.5 g/dL — AB (ref 31.8–35.4)
MCV: 85 fL (ref 80–97)
MPV: 8.3 fL (ref 0–99.8)
POC Granulocyte: 6 (ref 2–6.9)
POC LYMPH PERCENT: 28 %L (ref 10–50)
Platelet Count, POC: 224 10*3/uL (ref 142–424)
RBC: 5.1 M/uL (ref 4.04–5.48)
RDW, POC: 13.9 %
WBC: 9.2 10*3/uL (ref 4.6–10.2)

## 2015-01-30 MED ORDER — MECLIZINE HCL 12.5 MG PO TABS: 12.5000 mg | ORAL_TABLET | Freq: Three times a day (TID) | ORAL | Status: DC | PRN

## 2015-01-30 NOTE — Addendum Note (Signed)
Addended by: Zannie Cove on: 01/30/2015 12:35 PM   Modules accepted: Orders

## 2015-01-30 NOTE — Progress Notes (Signed)
Subjective:    Patient ID: [name], female    DOB: May 10, 1951, 64 y.o.   MRN: [id number]  HPI Pt here for follow up and management of chronic medical problems which includes hypertension, hyperlipidemia, and diabetes. She is taking medications regularly. Patient has a history of a CVA presenting with confusion and aphasia in March of this year. She indicates that she still has some problems with her recall and memory which have not come back as well as she would like them to. She's been to the beach recently and complains of some dizziness and has run out of her meclizine which will be refilled.      Patient Active Problem List   Diagnosis Date Noted  . Aphasia 12/08/2014  . TIA (transient ischemic attack) 11/07/2014  . 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 CKD stage 3 due to type 2 diabetes mellitus 06/03/2014  . Type II diabetes mellitus with renal manifestations 06/03/2014  . Type II diabetes mellitus with peripheral circulatory disorder 06/03/2014  . Obesity (BMI 30.0-34.9) 04/24/2014  . Dizziness and giddiness 04/14/2014  . 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 Osteopenia 10/09/2013  . Nonhealing nonsurgical wound 09/17/2013  . Abnormal ankle brachial index 09/11/2013  . Hypomagnesemia 08/01/2013  . DYSPNEA 08/18/2010  . MYOCARDIAL PERFUSION SCAN, WITH STRESS TEST, ABNORMAL 08/04/2010  . ELECTROCARDIOGRAM, ABNORMAL 07/28/2010  . Edema extremities 12/30/2008  . Hyperlipidemia 12/26/2008  . Benign essential HTN 12/26/2008  . CAD 12/26/2008  . PERICARDIAL EFFUSION 12/26/2008  . GERD 12/26/2008  . OSTEOARTHRITIS 12/26/2008   Outpatient Encounter Prescriptions as of 01/30/2015  Medication Sig  . ACCU-CHEK AVIVA PLUS test strip CHECK BLOOD SUGAR UP TO TWICE DAILY OR AS INSTRUCTED  . aspirin 325 MG tablet Take 325 mg by mouth every 6 (six) hours as needed for headache.  . B Complex Vitamins (B-COMPLEX/B-12 SL) Place 500 mcg under the tongue every  morning.  . B-D INS SYR ULTRAFINE 1CC/31G 31G X 5/16" 1 ML MISC USE TO INJECT INSULIN TWICE A DAY AS INSTRUCTED  . calcium carbonate (OS-CAL) 1250 MG chewable tablet Chew 1 tablet by mouth 3 (three) times daily.  . cetirizine (ZYRTEC) 10 MG tablet Take 10 mg by mouth as needed for allergies.  Marland Kitchen insulin glargine (LANTUS) 100 UNIT/ML injection Inject up to 30 units twice a day  . Insulin Pen Needle (B-D ULTRAFINE III SHORT PEN) 31G X 8 MM MISC Use to administer insulin daily  . losartan (COZAAR) 25 MG tablet Take 1 tablet (25 mg total) by mouth daily.  Marland Kitchen lovastatin (MEVACOR) 40 MG tablet Take 1 tablet (40 mg total) by mouth at bedtime.  . metFORMIN (GLUCOPHAGE) 1000 MG tablet 1 tab daily as directed (Patient taking differently: Take 500 mg by mouth 2 (two) times daily with a meal. 1 tab daily as directed)  . metoprolol (LOPRESSOR) 50 MG tablet Take 0.5 tablets (25 mg total) by mouth 2 (two) times daily.  . Multiple Vitamin (MULTIVITAMIN WITH MINERALS) TABS tablet Take 1 tablet by mouth 2 (two) times daily.  . sitaGLIPtin (JANUVIA) 100 MG tablet Take 1 tablet (100 mg total) by mouth daily with breakfast. As directed (Patient taking differently: Take 50 mg by mouth daily with breakfast. As directed)  . sodium chloride (OCEAN) 0.65 % SOLN nasal spray Place 1 spray into both nostrils as needed for congestion.   No facility-administered encounter medications on file as of 01/30/2015.      Review of Systems  Constitutional: Negative.  HENT: Negative.   Eyes: Negative.   Respiratory: Negative.   Cardiovascular: Negative.   Gastrointestinal: Negative.   Endocrine: Negative.   Genitourinary: Negative.   Musculoskeletal: Negative.   Skin: Negative.   Allergic/Immunologic: Negative.   Neurological: Positive for dizziness (since recent beach trip).  Hematological: Negative.   Psychiatric/Behavioral: Positive for confusion (memory seems to be not as good as before stroke).       Objective:    Physical Exam  Constitutional: She is oriented to person, place, and time. She appears well-developed and well-nourished. No distress.  The patient is alert and cooperative  HENT:  Head: Normocephalic and atraumatic.  Right Ear: External ear normal.  Left Ear: External ear normal.  Nose: Nose normal.  Mouth/Throat: Oropharynx is clear and moist.  TMs were well visualized with no erythema  Eyes: Conjunctivae and EOM are normal. Pupils are equal, round, and reactive to light. Right eye exhibits no discharge. Left eye exhibits no discharge. No scleral icterus.  Neck: Normal range of motion. Neck supple. No thyromegaly present.  There is a right carotid bruit which most likely radiates from a systolic heart murmur.  Cardiovascular: Normal rate, regular rhythm and intact distal pulses.  Exam reveals no gallop and no friction rub.   Murmur heard. The rhythm is regular at 68/m with a grade 2/6 systolic ejection murmur. Pulses are more difficult to palpate in the left foot.  Pulmonary/Chest: Effort normal and breath sounds normal. No respiratory distress. She has no wheezes. She has no rales. She exhibits no tenderness.  Clear anteriorly and posteriorly  Abdominal: Soft. Bowel sounds are normal. She exhibits no mass. There is no tenderness. There is no rebound and no guarding.  Abdominal obesity without masses or tenderness  Musculoskeletal: Normal range of motion. She exhibits no edema or tenderness.  The patient has full range of motion of all extremities with good strength bilaterally upper and lower  Lymphadenopathy:    She has no cervical adenopathy.  Neurological: She is alert and oriented to person, place, and time. She has normal reflexes. No cranial nerve deficit.  Skin: Skin is warm and dry. No rash noted.  Psychiatric: She has a normal mood and affect. Her behavior is normal. Judgment and thought content normal.  Nursing note and vitals reviewed.  BP 148/71 mmHg  Pulse 79  Temp(Src)  97 F (36.1 C) (Oral)  Ht 5\' 7"  (1.702 m)  Wt 199 lb (90.266 kg)  BMI 31.16 kg/m2        Assessment & Plan:  1. Vitamin D deficiency -Continue current treatment pending results of lab work  2. Benign essential HTN -The systolic blood pressure is slightly elevated today and we will have the patient continue to monitor her blood pressure at home and watch her sodium intake  3. Type II diabetes mellitus with peripheral circulatory disorder -She will continue with diet and exercise and her current diabetic treatment  4. Hyperlipidemia -Continue lovastatin and aggressive therapeutic lifestyle changes  5. CKD stage 3 due to type 2 diabetes mellitus -We will get a BMP today and follow-up on her kidney disease.  6. Labyrinthitis, bilateral -She will continue to take meclizine regularly for at least one week with food  7. Transient cerebral ischemia, unspecified transient cerebral ischemia type -This is stable and she has good strength bilaterally. She does complain of some minor issues with memory.  Patient Instructions  Medicare Annual Wellness Visit  [facility] and the medical providers at [facility] strive to bring you the best medical care.  In doing so we not only want to address your current medical conditions and concerns but also to detect new conditions early and prevent illness, disease and health-related problems.    Medicare offers a yearly Wellness Visit which allows our clinical staff to assess your need for preventative services including immunizations, lifestyle education, counseling to decrease risk of preventable diseases and screening for fall risk and other medical concerns.    This visit is provided free of charge (no copay) for all Medicare recipients. The clinical pharmacists at [facility] have begun to conduct these Wellness Visits which will also include a thorough review of all your  medications.    As you primary medical provider recommend that you make an appointment for your Annual Wellness Visit if you have not done so already this year.  You may set up this appointment before you leave today or you may call back [phone] and schedule an appointment.  Please make sure when you call that you mention that you are scheduling your Annual Wellness Visit with the clinical pharmacist so that the appointment may be made for the proper length of time.      Continue current medications. Continue good therapeutic lifestyle changes which include good diet and exercise. Fall precautions discussed with patient. If an FOBT was given today- please return it to our front desk. If you are over 69 years old - you may need Prevnar 56 or the adult Pneumonia vaccine.  Flu Shots are still available at our office. If you still haven't had one please call to set up a nurse visit to get one.   After your visit with [place] today you will receive a survey in the mail or online from [employer] regarding your care with [place]. Please take a moment to fill this out. Your feedback is very important to [place] as you can help [place] better understand your patient needs as well as improve your experience and satisfaction. WE CARE ABOUT YOU!!!   Since the CVA has happened, the patient needs to exercise her brain more. Doing crossword puzzles etc. Drink plenty of fluids this will help the constipation. Follow-up with cardiology in the future as planned and with neurology as needed Exercise as much as possible and continue to watch the diet closely Take the Antivert or meclizine more regularly for the next week then as needed. Take this with food. Drinking plenty of fluids will also help the inner ear.   Arrie Senate MD

## 2015-01-30 NOTE — Patient Instructions (Addendum)
Medicare Annual Wellness Visit  [facility] and the medical providers at [facility] strive to bring you the best medical care.  In doing so we not only want to address your current medical conditions and concerns but also to detect new conditions early and prevent illness, disease and health-related problems.    Medicare offers a yearly Wellness Visit which allows our clinical staff to assess your need for preventative services including immunizations, lifestyle education, counseling to decrease risk of preventable diseases and screening for fall risk and other medical concerns.    This visit is provided free of charge (no copay) for all Medicare recipients. The clinical pharmacists at [facility] have begun to conduct these Wellness Visits which will also include a thorough review of all your medications.    As you primary medical provider recommend that you make an appointment for your Annual Wellness Visit if you have not done so already this year.  You may set up this appointment before you leave today or you may call back [phone] and schedule an appointment.  Please make sure when you call that you mention that you are scheduling your Annual Wellness Visit with the clinical pharmacist so that the appointment may be made for the proper length of time.      Continue current medications. Continue good therapeutic lifestyle changes which include good diet and exercise. Fall precautions discussed with patient. If an FOBT was given today- please return it to our front desk. If you are over 42 years old - you may need Prevnar 52 or the adult Pneumonia vaccine.  Flu Shots are still available at our office. If you still haven't had one please call to set up a nurse visit to get one.   After your visit with [place] today you will receive a survey in the mail or online from [employer] regarding your care with [place]. Please take a moment to  fill this out. Your feedback is very important to [place] as you can help [place] better understand your patient needs as well as improve your experience and satisfaction. WE CARE ABOUT YOU!!!   Since the CVA has happened, the patient needs to exercise her brain more. Doing crossword puzzles etc. Drink plenty of fluids this will help the constipation. Follow-up with cardiology in the future as planned and with neurology as needed Exercise as much as possible and continue to watch the diet closely Take the Antivert or meclizine more regularly for the next week then as needed. Take this with food. Drinking plenty of fluids will also help the inner ear.

## 2015-01-31 LAB — LIPID PANEL
Chol/HDL Ratio: 3.9 ratio units (ref 0.0–4.4)
Cholesterol, Total: 232 mg/dL — ABNORMAL HIGH (ref 100–199)
HDL: 59 mg/dL (ref >39)
LDL Calculated: 118 mg/dL — ABNORMAL HIGH (ref 0–99)
Triglycerides: 274 mg/dL — ABNORMAL HIGH (ref 0–149)
VLDL Cholesterol Cal: 55 mg/dL — ABNORMAL HIGH (ref 5–40)

## 2015-01-31 LAB — BMP8+EGFR
BUN/Creatinine Ratio: 26 (ref 11–26)
BUN: 25 mg/dL (ref 8–27)
CO2: 25 mmol/L (ref 18–29)
Calcium: 10 mg/dL (ref 8.7–10.3)
Chloride: 102 mmol/L (ref 97–108)
Creatinine, Ser: 0.98 mg/dL (ref 0.57–1.00)
GFR calc Af Amer: 71 mL/min/{1.73_m2} (ref >59)
GFR calc non Af Amer: 61 mL/min/{1.73_m2} (ref >59)
Glucose: 189 mg/dL — ABNORMAL HIGH (ref 65–99)
Potassium: 5 mmol/L (ref 3.5–5.2)
Sodium: 142 mmol/L (ref 134–144)

## 2015-01-31 LAB — HEPATIC FUNCTION PANEL
ALT: 15 IU/L (ref 0–32)
AST: 13 IU/L (ref 0–40)
Albumin: 4.1 g/dL (ref 3.6–4.8)
Alkaline Phosphatase: 70 IU/L (ref 39–117)
Bilirubin Total: 0.5 mg/dL (ref 0.0–1.2)
Bilirubin, Direct: 0.13 mg/dL (ref 0.00–0.40)
Total Protein: 6.5 g/dL (ref 6.0–8.5)

## 2015-01-31 LAB — VITAMIN D 25 HYDROXY (VIT D DEFICIENCY, FRACTURES): Vit D, 25-Hydroxy: 28.4 ng/mL — ABNORMAL LOW (ref 30.0–100.0)

## 2015-02-05 LAB — POCT GLYCOSYLATED HEMOGLOBIN (HGB A1C): Hemoglobin A1C: 9.4

## 2015-02-05 NOTE — Addendum Note (Signed)
Addended by: Earlene Plater on: 02/05/2015 08:21 AM   Modules accepted: Orders

## 2015-02-09 ENCOUNTER — Ambulatory Visit (INDEPENDENT_AMBULATORY_CARE_PROVIDER_SITE_OTHER): Payer: Commercial Managed Care - HMO | Admitting: Pharmacist

## 2015-02-09 ENCOUNTER — Encounter: Payer: Self-pay | Admitting: Pharmacist

## 2015-02-09 VITALS — BP 138/74 | HR 70 | Ht 67.0 in | Wt 203.0 lb

## 2015-02-09 DIAGNOSIS — E785 Hyperlipidemia, unspecified: Secondary | ICD-10-CM

## 2015-02-09 DIAGNOSIS — E1159 Type 2 diabetes mellitus with other circulatory complications: Secondary | ICD-10-CM

## 2015-02-09 DIAGNOSIS — E669 Obesity, unspecified: Secondary | ICD-10-CM | POA: Diagnosis not present

## 2015-02-09 DIAGNOSIS — E1151 Type 2 diabetes mellitus with diabetic peripheral angiopathy without gangrene: Secondary | ICD-10-CM

## 2015-02-09 MED ORDER — ROSUVASTATIN CALCIUM 10 MG PO TABS: 10.0000 mg | ORAL_TABLET | Freq: Every day | ORAL | Status: DC

## 2015-02-09 NOTE — Patient Instructions (Signed)
Start Crestor 10mg  take 1 tablet daily Increase metformin 1000mg  - take 1 tablet twice a day with food.   Diabetes and Standards of Medical Care   Diabetes is complicated. You may find that your diabetes team includes a dietitian, nurse, diabetes educator, eye doctor, and more. To help everyone know what is going on and to help you get the care you deserve, the following schedule of care was developed to help keep you on track. Below are the tests, exams, vaccines, medicines, education, and plans you will need.  Blood Glucose Goals Prior to meals = 80 - 130 Within 2 hours of the start of a meal = less than 180  HbA1c test (goal is less than 7.0% - your last value was %) This test shows how well you have controlled your glucose over the past 2 to 3 months. It is used to see if your diabetes management plan needs to be adjusted.   It is performed at least 2 times a year if you are meeting treatment goals.  It is performed 4 times a year if therapy has changed or if you are not meeting treatment goals.  Blood pressure test  This test is performed at every routine medical visit. The goal is less than 140/90 mmHg for most people, but 130/80 mmHg in some cases. Ask your health care provider about your goal.  Dental exam  Follow up with the dentist regularly.  Eye exam  If you are diagnosed with type 1 diabetes as a child, get an exam upon reaching the age of 55 years or older and have had diabetes for 3 to 5 years. Yearly eye exams are recommended after that initial eye exam.  If you are diagnosed with type 1 diabetes as an adult, get an exam within 5 years of diagnosis and then yearly.  If you are diagnosed with type 2 diabetes, get an exam as soon as possible after the diagnosis and then yearly.  Foot care exam  Visual foot exams are performed at every routine medical visit. The exams check for cuts, injuries, or other problems with the feet.  A comprehensive foot exam should be  done yearly. This includes visual inspection as well as assessing foot pulses and testing for loss of sensation.  Check your feet nightly for cuts, injuries, or other problems with your feet. Tell your health care provider if anything is not healing.  Kidney function test (urine microalbumin)  This test is performed once a year.  Type 1 diabetes: The first test is performed 5 years after diagnosis.  Type 2 diabetes: The first test is performed at the time of diagnosis.  A serum creatinine and estimated glomerular filtration rate (eGFR) test is done once a year to assess the level of chronic kidney disease (CKD), if present.  Lipid profile (cholesterol, HDL, LDL, triglycerides)  Performed every 5 years for most people.  The goal for LDL is less than 100 mg/dL. If you are at high risk, the goal is less than 70 mg/dL.  The goal for HDL is 40 mg/dL to 50 mg/dL for men and 50 mg/dL to 60 mg/dL for women. An HDL cholesterol of 60 mg/dL or higher gives some protection against heart disease.  The goal for triglycerides is less than 150 mg/dL.  Influenza vaccine, pneumococcal vaccine, and hepatitis B vaccine  The influenza vaccine is recommended yearly.  The pneumococcal vaccine is generally given once in a lifetime. However, there are some instances when another vaccination  is recommended. Check with your health care provider.  The hepatitis B vaccine is also recommended for adults with diabetes.  Diabetes self-management education  Education is recommended at diagnosis and ongoing as needed.  Treatment plan  Your treatment plan is reviewed at every medical visit.  Document Released: 05/29/2009 Document Revised: 04/03/2013 Document Reviewed: 01/01/2013 [facility] Patient Information 2014 [place].

## 2015-02-09 NOTE — Progress Notes (Signed)
Patient ID: [name], female   DOB: 26-Sep-1950, 64 y.o.   MRN: [id number] Diabetes Follow-Up Visit Chief Complaint:   Chief Complaint  Patient presents with  . Hyperlipidemia  . Diabetes     Filed Vitals:   02/09/15 1056  BP: 138/74  Pulse: 70   Filed Weights   02/09/15 1056  Weight: 203 lb (92.08 kg)   Body mass index is 31.79 kg/(m^2).   HPI: Patient with type 2 DM treated with insulin.  She had gastric sleeve surgery 01/20/14.  She has had no complications and has lost 56# since 11/11/13.  Currently she is taking Lantus 30 units BID, Januvia 100mg  1/2 tablet daily, metformin 1000mg  1/2 bid    BG ranges from 106 (surgeon does not want BG less than 100 for the few months after gastric bypass) to 245 7 day avg = 159 14 day avg = 155 30 day avg = 145   A1c was 9.4% 01/30/2015 which has increased compared to 3 months ago  Hyperlipidemia - taking lovastatin 40mg  daily (she has tolerated Livalo 4mg  daily but insurance wouldn't sontinue to cover)    Diet - she admits that the last 2 months she has not been as adherent to prescribed diet.  She is taking MVI and other recommended vitamins regularly.  Continues to see nutritionist every 3 months.  Low fat/carbohydrate diet?  Yes  Nicotine Abuse?  No Medication Compliance?  Yes Exercise?  Less lately due to family illness Alcohol Abuse?  No   Exam Edema:  negative Polyuria:  negative  Polydipsia:  negative Polyphagia:  negative  BMI:  Body mass index is 31.79 kg/(m^2).    Weight changes:  Increased about 4# over last month. General Appearance:  alert, oriented, no acute distress and obese Mood/Affect:  normal   Lab Results  Component Value Date   HGBA1C 9.4 02/05/2015    No results found for: [facility]  Lab Results  Component Value Date   CHOL 232* 01/30/2015   HDL 59 01/30/2015   LDLCALC 118* 01/30/2015   LDLDIRECT 113* 02/26/2013   TRIG 274* 01/30/2015   CHOLHDL 3.9 01/30/2015      Assessment: 1.   Diabetes  - not adequately controrlled 2.  Blood Pressure.  Controlled 3.  Lipids.  not at goal and preferred agent (Livilo) not covered by insurance 4.  Obesity - patient was having difficulty with diet and exercise adherence    Recommendations: 1.  Medication recommendations at this time are as follows:    Continue Lantus to 30 units bid  Increase metformin 1000mg  1 tablet bid with food  Continue januvia 100mg  1/2 tablet each morning  Change lovastatin for more potent statin - try crestor 10mg  1 tablet  daily.  Patient also given #14 livalo 4mg  incase she cannot tolerate  crestor or if creator requires PA/ cost too high 2.  Reviewed HBG goals:  Fasting 80-130 and 1-2 hour post prandial <180.  Patient is instructed to check BG 3 times per day.    3.  BP goal < 140/85. 4.  LDL goal of < 100, HDL > 40 and TG < 150. 5.  Eye Exam yearly and Dental Exam every 6 months. 6.  Dietary recommendations:  Patient is to continue diet recommended by nutritionist with [facility] 7.  Physical Activity recommendations:  Restart walking 8. Return to clinic in 4 months with PCP and 2 months clinical pharmacist / CDE  Time spent counseling patient:  60 minutes  Referring provider:  Ermalinda Memos, PharmD, CPP, CDE

## 2015-02-17 ENCOUNTER — Telehealth: Payer: Self-pay | Admitting: Family Medicine

## 2015-02-17 DIAGNOSIS — E1151 Type 2 diabetes mellitus with diabetic peripheral angiopathy without gangrene: Secondary | ICD-10-CM

## 2015-02-17 NOTE — Telephone Encounter (Signed)
This referral is okay

## 2015-02-18 NOTE — Telephone Encounter (Signed)
Referral placed w/ notes to Dr. Rona Ravens in [place]

## 2015-02-20 ENCOUNTER — Telehealth: Payer: Self-pay | Admitting: Pharmacist

## 2015-02-23 ENCOUNTER — Telehealth: Payer: Self-pay

## 2015-02-23 ENCOUNTER — Other Ambulatory Visit: Payer: Self-pay | Admitting: Pharmacist

## 2015-02-23 ENCOUNTER — Ambulatory Visit (AMBULATORY_SURGERY_CENTER): Payer: Commercial Managed Care - HMO

## 2015-02-23 ENCOUNTER — Other Ambulatory Visit: Payer: Self-pay | Admitting: Family Medicine

## 2015-02-23 VITALS — Ht 67.5 in | Wt 201.0 lb

## 2015-02-23 DIAGNOSIS — Z1211 Encounter for screening for malignant neoplasm of colon: Secondary | ICD-10-CM

## 2015-02-23 MED ORDER — NA SULFATE-K SULFATE-MG SULF 17.5-3.13-1.6 GM/177ML PO SOLN: 1.0000 | Freq: Once | ORAL | Status: DC

## 2015-02-23 NOTE — Telephone Encounter (Signed)
Called home number and lady that answered states pt isn't home.  [facility] at [facility] room 50 number [phone]. We need to schedule Ov late September with stark to dscuss colon per dr stark  Lelan Pons PV

## 2015-02-23 NOTE — Telephone Encounter (Signed)
No colonoscopy within 6 months of TIA. Schedule for office visit in Sept 2016 to discuss colonoscopy.

## 2015-02-23 NOTE — Telephone Encounter (Signed)
Dr. Fuller Plan, I saw this patient in [place] today. She has a history of a difficult intubation in 2014 with parotid surgery. She also had a TIA in March 2016 with hospital admission. Do you want her to have an office visit or direct hospital colon? Please advise.  Thanks  Abigail Butts PV

## 2015-02-23 NOTE — Progress Notes (Signed)
History of difficult intubation and N/V with anesthesia. te sent to [facility] OV  Vs direct hospital colon. Not on home 02 Pt not taking diet medications No egg or soy allergies

## 2015-02-24 NOTE — Telephone Encounter (Signed)
Pt returned call.Pt was scheduled for an office visit 05/13/15 with Dr Fuller Plan to discuss a colonoscopy.Her colon appointment on 03/06/15 was cancelled.

## 2015-02-26 MED ORDER — PITAVASTATIN CALCIUM 4 MG PO TABS: 1.0000 | ORAL_TABLET | Freq: Every day | ORAL | Status: DC

## 2015-02-26 NOTE — Telephone Encounter (Signed)
Will try to get PA for Livalo 4mg  since unable to tolerate Crestor, Lipitor, simvastatin or Vytorin.  All caused myalgias.

## 2015-03-05 ENCOUNTER — Other Ambulatory Visit: Payer: Self-pay | Admitting: Pharmacist

## 2015-03-05 MED ORDER — PITAVASTATIN CALCIUM 4 MG PO TABS: 1.0000 | ORAL_TABLET | Freq: Every day | ORAL | Status: DC

## 2015-03-06 ENCOUNTER — Encounter: Payer: Commercial Managed Care - HMO | Admitting: Gastroenterology

## 2015-03-25 ENCOUNTER — Telehealth: Payer: Self-pay | Admitting: Family Medicine

## 2015-03-25 DIAGNOSIS — Z0100 Encounter for examination of eyes and vision without abnormal findings: Principal | ICD-10-CM

## 2015-03-25 DIAGNOSIS — Z1211 Encounter for screening for malignant neoplasm of colon: Secondary | ICD-10-CM

## 2015-03-25 DIAGNOSIS — E119 Type 2 diabetes mellitus without complications: Secondary | ICD-10-CM

## 2015-03-25 NOTE — Telephone Encounter (Signed)
Detailed message left for patient that referrals have been placed.

## 2015-03-30 ENCOUNTER — Ambulatory Visit: Payer: Commercial Managed Care - HMO | Admitting: Dietician

## 2015-05-05 LAB — HM DIABETES EYE EXAM

## 2015-05-06 ENCOUNTER — Ambulatory Visit (INDEPENDENT_AMBULATORY_CARE_PROVIDER_SITE_OTHER): Payer: Commercial Managed Care - HMO | Admitting: Pharmacist

## 2015-05-06 ENCOUNTER — Encounter: Payer: Self-pay | Admitting: Pharmacist

## 2015-05-06 VITALS — BP 136/64 | HR 72 | Ht 68.0 in | Wt 206.0 lb

## 2015-05-06 DIAGNOSIS — E1151 Type 2 diabetes mellitus with diabetic peripheral angiopathy without gangrene: Secondary | ICD-10-CM

## 2015-05-06 DIAGNOSIS — E1159 Type 2 diabetes mellitus with other circulatory complications: Secondary | ICD-10-CM

## 2015-05-06 DIAGNOSIS — N183 Chronic kidney disease, stage 3 unspecified: Secondary | ICD-10-CM

## 2015-05-06 DIAGNOSIS — R42 Dizziness and giddiness: Secondary | ICD-10-CM

## 2015-05-06 DIAGNOSIS — E1122 Type 2 diabetes mellitus with diabetic chronic kidney disease: Secondary | ICD-10-CM | POA: Diagnosis not present

## 2015-05-06 DIAGNOSIS — G459 Transient cerebral ischemic attack, unspecified: Secondary | ICD-10-CM

## 2015-05-06 DIAGNOSIS — Z0000 Encounter for general adult medical examination without abnormal findings: Secondary | ICD-10-CM

## 2015-05-06 DIAGNOSIS — R7989 Other specified abnormal findings of blood chemistry: Secondary | ICD-10-CM

## 2015-05-06 LAB — POCT GLYCOSYLATED HEMOGLOBIN (HGB A1C): Hemoglobin A1C: 11

## 2015-05-06 LAB — GLUCOSE, POCT (MANUAL RESULT ENTRY): POC Glucose: 290 mg/dl — AB (ref 70–99)

## 2015-05-06 MED ORDER — CALCIUM CARBONATE-VITAMIN D 600-400 MG-UNIT PO TABS: 1.0000 | ORAL_TABLET | Freq: Two times a day (BID) | ORAL | Status: DC

## 2015-05-06 MED ORDER — METFORMIN HCL 1000 MG PO TABS: ORAL_TABLET | ORAL | Status: DC

## 2015-05-06 NOTE — Progress Notes (Signed)
Patient ID: [name], female   DOB: 1951/05/26, 64 y.o.   MRN: [id number]    Subjective:   [name] is a 64 y.o. female who presents for an Initial Medicare Annual Wellness Visit and to recheck uncontrolled type 2 DM .   [name] is married and lives with her husband in [place], [place].   She report that her weight has increased recently due to dietary indiscretions and her BG has also increased.  She has gastric bypass sleeve June 2015 and lost a significant amount of weight.   Over the last month she has restarted her Januvia and metformin which she has stopped.  Current medications for diabetes include - Lantus 32 units BID, Januvia 194m 1/2 tablet qd, metformin 10052m1/2 tablet bid.    AM BG readings have been around 250 in am.  Current Medications (verified) Outpatient Encounter Prescriptions as of 05/06/2015  Medication Sig  . ACCU-CHEK AVIVA PLUS test strip CHECK BLOOD SUGAR UP TO TWICE DAILY OR AS INSTRUCTED  . aspirin 325 MG tablet Take 325 mg by mouth daily.   . B Complex Vitamins (B-COMPLEX/B-12 SL) Place 500 mcg under the tongue every other day.   . B-D INS SYR ULTRAFINE 1CC/31G 31G X 5/16" 1 ML MISC USE TO INJECT INSULIN TWICE A DAY AS INSTRUCTED  . cetirizine (ZYRTEC) 10 MG tablet Take 10 mg by mouth as needed for allergies.  . Marland Kitchennsulin glargine (LANTUS) 100 UNIT/ML injection 0.32 mLs (32 Units total) 2 (two) times daily. Inject up to 25 units twice a day  . Insulin Pen Needle (B-D ULTRAFINE III SHORT PEN) 31G X 8 MM MISC Use to administer insulin daily  . losartan (COZAAR) 25 MG tablet TAKE ONE TABLET BY MOUTH ONE TIME DAILY  . meclizine (ANTIVERT) 12.5 MG tablet Take 1 tablet (12.5 mg total) by mouth 3 (three) times daily as needed for dizziness.  . metFORMIN (GLUCOPHAGE) 1000 MG tablet Take 1 tablet qam and 1/2 tablet qpm with food  . metoprolol (LOPRESSOR) 50 MG tablet TAKE ONE HALF TABLET BY MOUTH TWICE DAILY  . Multiple Vitamin (MULTIVITAMIN WITH  MINERALS) TABS tablet Take 1 tablet by mouth 2 (two) times daily.  . Pitavastatin Calcium 4 MG TABS Take 1 tablet (4 mg total) by mouth daily.  . sitaGLIPtin (JANUVIA) 100 MG tablet Take 1 tablet (100 mg total) by mouth daily with breakfast. As directed (Patient taking differently: Take 50 mg by mouth daily with breakfast. As directed)  . sodium chloride (OCEAN) 0.65 % SOLN nasal spray Place 1 spray into both nostrils as needed for congestion.  . [DISCONTINUED] calcium carbonate (OS-CAL) 1250 MG chewable tablet Chew 1 tablet by mouth 2 (two) times daily.   .  insulin glargine (LANTUS) 100 UNIT/ML injection Inject up to 30 units twice a day (Patient taking differently: 32 Units 2 (two) times daily. Inject up to 25 units twice a day)  . [metFORMIN (GLUCOPHAGE) 1000 MG tablet 1 tab daily as directed (Patient taking differently: Take 500 mg by mouth 2 (two) times daily with a meal. 1 tab daily as directed)  . Calcium Carbonate-Vitamin D (CALCIUM 600+D) 600-400 MG-UNIT per tablet Take 1 tablet by mouth 2 (two) times daily.  . Na Sulfate-K Sulfate-Mg Sulf (SUPREP BOWEL PREP) SOLN Take 1 kit by mouth once. Name brand only, no substitutions, take as directed (Patient not taking: Reported on 05/06/2015)   No facility-administered encounter medications on file as of 05/06/2015.    Allergies (verified)  Atorvastatin; Crestor; Ezetimibe-simvastatin; Celebrex; Levemir; and Penicillins   History: Past Medical History  Diagnosis Date  . 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 Hyperlipidemia   . Cystocele   . IBS (irritable bowel syndrome)   . Varicose veins   . Superficial phlebitis   . Esophageal stricture   . Fatty liver   . Osteopenia   . Vitamin D deficiency   . Dyslipidemia   . GERD (gastroesophageal reflux disease)   . Osteoarthritis   . Pericardial effusion     a. Mod by echo 2012 at time of PNA. b. Echo 07/2012: small pericardial effusion vs fat.  . Hypertension     dr  Percival Spanish  . Obesity   . CAD (coronary artery disease)     a. Nonobstructive by cath 2006 - 25% LAD, 25-30% after 1st diag, distal 25% PDA, minor irreg LCx. b. Normal nuc 2011.  Marland Kitchen Hiatal hernia     a. 07/2013: HH with small stricture holding up barium tablet (concides with patient's sx of food sticking).  . 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  . Morbid obesity   . Abnormal Doppler ultrasound of carotid artery     a. 05/2013 - mild plaque, 1-39% BICA, abnormal appearrance of thyroid (TSH wnl in 06/2013).  . Palpitations     a. PVC/PAC on EKG 08/2013.  Marland Kitchen Sleep apnea     CPAP currently broke, calling company about machine, pt does not know cpap settings, uses cpap off and on  . Family history of anesthesia complication     daughter slow to awaken after teeth extraction at age 39  . Toe ulcer 01-08-14    Left big toe-being tx. at wound center "is healing"  . PONV (postoperative nausea and vomiting)     n/v, and room spinning after cataract and parotid surgery  . Difficult intubation 09-07-2012    big neck trouble with intubation parotid surgery"takes little med to sedate"  . Dizziness and giddiness 04/14/2014  . Fracture, skull   . Allergy     seasonal  . Cataract     surgery  . TIA (transient ischemic attack) 2016  . Heart murmur   . Osteoporosis   . Stroke 11-07-2014    tia  . Chronic kidney disease     stage 3   Past Surgical History  Procedure Laterality Date  . Carpal tunnel release  5 yrs ago    Left  . Cataract extraction Bilateral yrs ago  . Cardiac catheterization      06  . Parotidectomy  09/07/2012    Procedure: PAROTIDECTOMY;  Surgeon: Melida Quitter, MD;  Location: [facility];  Service: ENT;  Laterality: Left;  LEFT PAROTIDECTOMY  . Breath tek h pylori N/A 07/29/2013    Procedure: BREATH TEK H PYLORI;  Surgeon: Shann Medal, MD;  Location: [facility];  Service: General;  Laterality: N/A;  . Tubal ligation  yrs ago  . Tonsillectomy  1968  .  Cholecystectomy  1982  . Esophagogastroduodenoscopy N/A 12/27/2013    Procedure: ESOPHAGOGASTRODUODENOSCOPY (EGD);  Surgeon: Shann Medal, MD;  Location: [facility];  Service: General;  Laterality: N/A;  . Laparoscopic gastric sleeve resection N/A 01/20/2014    Procedure: LAPAROSCOPIC GASTRIC SLEEVE RESECTION;  Surgeon: Shann Medal, MD;  Location: [facility];  Service: General;  Laterality: N/A;  . Upper gi endoscopy  01/20/2014    Procedure: UPPER GI ENDOSCOPY;  Surgeon: Shann Medal, MD;  Location: [facility];  Service: General;;  . Eye surgery     Family History  Problem Relation Age of Onset  . Heart failure Mother   . Hypertension Mother   . Skin cancer Mother   . Colitis Mother   . Diabetes Mother   . Kidney Stones Mother   . Heart disease Father   . Colon polyps Father   . Diabetes Father   . Diabetes Other   . Heart attack Sister   . Colon cancer Neg Hx   . Diabetes Daughter    Social History   Occupational History  . retired    Social History Main Topics  . Smoking status: Never Smoker   . Smokeless tobacco: Never Used  . Alcohol Use: 0.0 oz/week    0 Standard drinks or equivalent per week     Comment: rarely  . Drug Use: No  . Sexual Activity: Yes    Birth Control/ Protection: Post-menopausal    Do you feel safe at home?  No  Dietary issues and exercise activities: Current Exercise Habits:: Home exercise routine, Type of exercise: walking, Time (Minutes): 15, Frequency (Times/Week): 1, Weekly Exercise (Minutes/Week): 15, Intensity: Mild  Current Dietary habits:  Started drinking diet sodas again, increased bread intake and eating more candy though she reports it is sugar free.  Last saw dietician at bariatric clinic about 3 months ago.   Objective:    Today's Vitals   05/06/15 1031  BP: 136/64  Pulse: 72  Height: 5' 8"  (1.727 m)  Weight: 206 lb (93.441 kg)  PainSc: 0-No pain   Body mass index is 31.33 kg/(m^2).   A1c = 11% today (increased from 9.4%  02/05/2015)  Activities of Daily Living In your present state of health, do you have any difficulty performing the following activities: 05/06/2015 11/07/2014  Hearing? Tempie Donning  Vision? N Y  Difficulty concentrating or making decisions? Tempie Donning  Walking or climbing stairs? N N  Dressing or bathing? N N  Doing errands, shopping? N N  Preparing Food and eating ? N -  Using the Toilet? N -  In the past six months, have you accidently leaked urine? N -  Do you have problems with loss of bowel control? N -  Managing your Medications? N -  Managing your Finances? N -  Housekeeping or managing your Housekeeping? N -    Are there smokers in your home (other than you)? No   Cardiac Risk Factors include: advanced age (>40mn, >>52women);diabetes mellitus;dyslipidemia;family history of premature cardiovascular disease;hypertension;microalbuminuria;obesity (BMI >30kg/m2);sedentary lifestyle  Depression Screen PHQ 2/9 Scores 05/06/2015 01/30/2015 11/17/2014 09/08/2014  PHQ - 2 Score 0 0 0 0    Fall Risk Fall Risk  05/06/2015 01/30/2015 11/17/2014 09/08/2014 04/09/2014  Falls in the past year? Yes No No Yes Yes  Number falls in past yr: 1 - - 1 2 or more  Injury with Fall? No - - No -  Follow up Falls prevention discussed - - - -    Cognitive Function: MMSE - Mini Mental State Exam 05/06/2015  Orientation to time 5  Orientation to Place 5  Registration 3  Attention/ Calculation 5  Recall 3  Language- name 2 objects 2  Language- repeat 1  Language- follow 3 step command 3  Language- read & follow direction 1  Write a sentence 1  Copy design 1    Immunizations and Health Maintenance Immunization History  Administered Date(s) Administered  .  Influenza Split 05/16/2011  . Influenza Whole 05/15/2010  . Influenza,inj,Quad PF,36+ Mos 05/22/2013, 06/03/2014  . Pneumococcal Polysaccharide-23 01/21/2014   Health Maintenance Due  Topic Date Due  . Hepatitis C Screening  February 19, 1951  . HIV Screening   12/29/1965  . URINE MICROALBUMIN  02/21/2015  . INFLUENZA VACCINE  03/16/2015  . COLON CANCER SCREENING ANNUAL FOBT  03/16/2015    Patient Care Team: Chipper Herb, MD as PCP - General (Family Medicine) Melida Quitter, MD as Referring Physician (Otolaryngology) Alphonsa Overall, MD as Consulting Physician (General Surgery) Minus Breeding, MD as Consulting Physician (Cardiology) Williams Che, MD as Consulting Physician (Ophthalmology) Woodroe Mode, MD as Consulting Physician (Obstetrics and Gynecology) Ladene Artist, MD as Consulting Physician (Gastroenterology)  Indicate any recent Medical Services you may have received from other than [facility] providers in the past year (date may be approximate).    Assessment:    Annual Wellness Visit  Uncontrolled type 2 DM - requiring insulin Obesity  Screening Tests Health Maintenance  Topic Date Due  . Hepatitis C Screening  Oct 25, 1950  . HIV Screening  12/29/1965  . URINE MICROALBUMIN  02/21/2015  . INFLUENZA VACCINE  03/16/2015  . COLON CANCER SCREENING ANNUAL FOBT  03/16/2015  . COLONOSCOPY  05/25/2015 (Originally 05/20/2014)  . ZOSTAVAX  07/02/2015 (Originally 12/30/2010)  . HEMOGLOBIN A1C  08/07/2015  . FOOT EXAM  01/30/2016  . OPHTHALMOLOGY EXAM  05/04/2016  . PAP SMEAR  06/19/2016  . MAMMOGRAM  12/07/2016  . TETANUS/TDAP  04/15/2021        Plan:   During the course of the visit Kati was educated and counseled about the following appropriate screening and preventive services:   Vaccines to include Pneumoccal, Influenza, Hepatitis B, Td, Zostavax.  DIscussed getting Zodtavax vaccine but was postponed due to plan to get influenza vaccine next week and patient also is expecting the delivery of a grandchild.  She plans to get shingles vaccine after new grandchild is vaccinated.  Colorectal cancer screening - has appt with Dr Fuller Plan to discuss next week  Cardiovascular disease screening - UTD  Diabetes - increase metformin  to 1062m 1 tablet qam and 1/2 tablet qam.  Increase Januvia to 10103m1 tablet qd.   Bone Denisty / Osteoporosis Screening - UTD  Mammogram - UTD  Glaucoma screening / Diabetic Eye Exam - UTD  - done yesterday 05/05/15  Nutrition counseling - reviewed CHO counting diet  Advanced Directives - information packet given.  Orders Placed This Encounter  Procedures  . CMP14+EGFR  . Lipid panel  . Thyroid Panel With TSH  . Microalbumin / creatinine urine ratio  . Hepatitis C antibody  . HIV antibody  . POCT glycosylated hemoglobin (Hb A1C)  . POCT glucose (manual entry)     Patient Instructions (the written plan) were given to the patient.   EcCherre RobinsPHKindred Hospital [facility] 05/06/2015

## 2015-05-06 NOTE — Patient Instructions (Addendum)
[name] , Thank you for taking time to come for your Medicare Wellness Visit. I appreciate your ongoing commitment to your health goals. Please review the following plan we discussed and let me know if I can assist you in the future.   These are the goals we discussed: Decrease A1c to 7.0% or less - recommend increase metformin to 1 tablet in am and 1/2 tablet in pm with food.  Also recommend increase Januvia to 139m - take 1 tablet daily.    This is a list of the screening recommended for you and due dates:  Health Maintenance  Topic Date Due  .  Hepatitis C: One time screening is recommended by Center for Disease Control  (CDC) for  adults born from [birth year]through 1965.   Checked today  . HIV Screening  Checked today  . Urine Protein Check  Checked today  . Flu Shot  03/16/2015  . Stool Blood Test  03/16/2015  . Colon Cancer Screening  05/25/2015 - plans to get this year  . Shingles Vaccine  07/02/2015*  . Hemoglobin A1C  Done today  . Complete foot exam   01/30/2016  . Eye exam for diabetics  05/04/2016  . Pap Smear  06/19/2016  . Mammogram  12/07/2016  . Tetanus Vaccine  04/15/2021  *Topic was postponed. The date shown is not the original due date.    Fall Prevention and Home Safety Falls cause injuries and can affect all age groups. It is possible to use preventive measures to significantly decrease the likelihood of falls. There are many simple measures which can make your home safer and prevent falls. OUTDOORS  Repair cracks and edges of walkways and driveways.  Remove high doorway thresholds.  Trim shrubbery on the main path into your home.  Have good outside lighting.  Clear walkways of tools, rocks, debris, and clutter.  Check that handrails are not broken and are securely fastened. Both sides of steps should have handrails.  Have leaves, snow, and ice cleared regularly.  Use sand or salt on walkways during winter months.  In the garage, clean up grease or  oil spills. BATHROOM  Install night lights.  Install grab bars by the toilet and in the tub and shower.  Use non-skid mats or decals in the tub or shower.  Place a plastic non-slip stool in the shower to sit on, if needed.  Keep floors dry and clean up all water on the floor immediately.  Remove soap buildup in the tub or shower on a regular basis.  Secure bath mats with non-slip, double-sided rug tape.  Remove throw rugs and tripping hazards from the floors. BEDROOMS  Install night lights.  Make sure a bedside light is easy to reach.  Do not use oversized bedding.  Keep a telephone by your bedside.  Have a firm chair with side arms to use for getting dressed.  Remove throw rugs and tripping hazards from the floor. KITCHEN  Keep handles on pots and pans turned toward the center of the stove. Use back burners when possible.  Clean up spills quickly and allow time for drying.  Avoid walking on wet floors.  Avoid hot utensils and knives.  Position shelves so they are not too high or low.  Place commonly used objects within easy reach.  If necessary, use a sturdy step stool with a grab bar when reaching.  Keep electrical cables out of the way.  Do not use floor polish or wax that makes  floors slippery. If you must use wax, use non-skid floor wax.  Remove throw rugs and tripping hazards from the floor. STAIRWAYS  Never leave objects on stairs.  Place handrails on both sides of stairways and use them. Fix any loose handrails. Make sure handrails on both sides of the stairways are as long as the stairs.  Check carpeting to make sure it is firmly attached along stairs. Make repairs to worn or loose carpet promptly.  Avoid placing throw rugs at the top or bottom of stairways, or properly secure the rug with carpet tape to prevent slippage. Get rid of throw rugs, if possible.  Have an electrician put in a light switch at the top and bottom of the stairs. OTHER  FALL PREVENTION TIPS  Wear low-heel or rubber-soled shoes that are supportive and fit well. Wear closed toe shoes.  When using a stepladder, make sure it is fully opened and both spreaders are firmly locked. Do not climb a closed stepladder.  Add color or contrast paint or tape to grab bars and handrails in your home. Place contrasting color strips on first and last steps.  Learn and use mobility aids as needed. Install an electrical emergency response system.  Turn on lights to avoid dark areas. Replace light bulbs that burn out immediately. Get light switches that glow.  Arrange furniture to create clear pathways. Keep furniture in the same place.  Firmly attach carpet with non-skid or double-sided tape.  Eliminate uneven floor surfaces.  Select a carpet pattern that does not visually hide the edge of steps.  Be aware of all pets. OTHER HOME SAFETY TIPS  Set the water temperature for 120 F (48.8 C).  Keep emergency numbers on or near the telephone.  Keep smoke detectors on every level of the home and near sleeping areas. Document Released: 07/22/2002 Document Revised: 01/31/2012 Document Reviewed: 10/21/2011 [facility] Patient Information 2015 [place], [place]. This information is not intended to replace advice given to you by your health care provider. Make sure you discuss any questions you have with your health care provider.   Health Maintenance Adopting a healthy lifestyle and getting preventive care can go a long way to promote health and wellness. Talk with your health care provider about what schedule of regular examinations is right for you. This is a good chance for you to check in with your provider about disease prevention and staying healthy. In between checkups, there are plenty of things you can do on your own. Experts have done a lot of research about which lifestyle changes and preventive measures are most likely to keep you healthy. Ask your health care provider  for more information. WEIGHT AND DIET  Eat a healthy diet  Be sure to include plenty of vegetables, fruits, low-fat dairy products, and lean protein.  Do not eat a lot of foods high in solid fats, added sugars, or salt.  Get regular exercise. This is one of the most important things you can do for your health.  Most adults should exercise for at least 150 minutes each week. The exercise should increase your heart rate and make you sweat (moderate-intensity exercise).  Most adults should also do strengthening exercises at least twice a week. This is in addition to the moderate-intensity exercise.  Maintain a healthy weight  Body mass index (BMI) is a measurement that can be used to identify possible weight problems. It estimates body fat based on height and weight. Your health care provider can help determine your BMI  and help you achieve or maintain a healthy weight.  For females 54 years of age and older:   A BMI below 18.5 is considered underweight.  A BMI of 18.5 to 24.9 is normal.  A BMI of 25 to 29.9 is considered overweight.  A BMI of 30 and above is considered obese.  Watch levels of cholesterol and blood lipids  You should start having your blood tested for lipids and cholesterol at 64 years of age, then have this test every 5 years.  You may need to have your cholesterol levels checked more often if:  Your lipid or cholesterol levels are high.  You are older than 64 years of age.  You are at high risk for heart disease.  CANCER SCREENING   Lung Cancer  Lung cancer screening is recommended for adults 68-28 years old who are at high risk for lung cancer because of a history of smoking.  A yearly low-dose CT scan of the lungs is recommended for people who:  Currently smoke.  Have quit within the past 15 years.  Have at least a 30-pack-year history of smoking. A pack year is smoking an average of one pack of cigarettes a day for 1 year.  Yearly screening  should continue until it has been 15 years since you quit.  Yearly screening should stop if you develop a health problem that would prevent you from having lung cancer treatment.  Breast Cancer  Practice breast self-awareness. This means understanding how your breasts normally appear and feel.  It also means doing regular breast self-exams. Let your health care provider know about any changes, no matter how small.  If you are in your 20s or 30s, you should have a clinical breast exam (CBE) by a health care provider every 1-3 years as part of a regular health exam.  If you are 51 or older, have a CBE every year. Also consider having a breast X-ray (mammogram) every year.  If you have a family history of breast cancer, talk to your health care provider about genetic screening.  If you are at high risk for breast cancer, talk to your health care provider about having an MRI and a mammogram every year.  Breast cancer gene (BRCA) assessment is recommended for women who have family members with BRCA-related cancers. BRCA-related cancers include:  Breast.  Ovarian.  Tubal.  Peritoneal cancers.  Results of the assessment will determine the need for genetic counseling and BRCA1 and BRCA2 testing. Cervical Cancer Routine pelvic examinations to screen for cervical cancer are no longer recommended for nonpregnant women who are considered low risk for cancer of the pelvic organs (ovaries, uterus, and vagina) and who do not have symptoms. A pelvic examination may be necessary if you have symptoms including those associated with pelvic infections. Ask your health care provider if a screening pelvic exam is right for you.   The Pap test is the screening test for cervical cancer for women who are considered at risk.  If you had a hysterectomy for a problem that was not cancer or a condition that could lead to cancer, then you no longer need Pap tests.  If you are older than 65 years, and you have  had normal Pap tests for the past 10 years, you no longer need to have Pap tests.  If you have had past treatment for cervical cancer or a condition that could lead to cancer, you need Pap tests and screening for cancer for at least 20  years after your treatment.  If you no longer get a Pap test, assess your risk factors if they change (such as having a new sexual partner). This can affect whether you should start being screened again.  Some women have medical problems that increase their chance of getting cervical cancer. If this is the case for you, your health care provider may recommend more frequent screening and Pap tests.  The human papillomavirus (HPV) test is another test that may be used for cervical cancer screening. The HPV test looks for the virus that can cause cell changes in the cervix. The cells collected during the Pap test can be tested for HPV.  The HPV test can be used to screen women 72 years of age and older. Getting tested for HPV can extend the interval between normal Pap tests from three to five years.  An HPV test also should be used to screen women of any age who have unclear Pap test results.  After 64 years of age, women should have HPV testing as often as Pap tests.  Colorectal Cancer  This type of cancer can be detected and often prevented.  Routine colorectal cancer screening usually begins at 64 years of age and continues through 64 years of age.  Your health care provider may recommend screening at an earlier age if you have risk factors for colon cancer.  Your health care provider may also recommend using home test kits to check for hidden blood in the stool.  A small camera at the end of a tube can be used to examine your colon directly (sigmoidoscopy or colonoscopy). This is done to check for the earliest forms of colorectal cancer.  Routine screening usually begins at age 95.  Direct examination of the colon should be repeated every 5-10 years  through 64 years of age. However, you may need to be screened more often if early forms of precancerous polyps or small growths are found. Skin Cancer  Check your skin from head to toe regularly.  Tell your health care provider about any new moles or changes in moles, especially if there is a change in a mole's shape or color.  Also tell your health care provider if you have a mole that is larger than the size of a pencil eraser.  Always use sunscreen. Apply sunscreen liberally and repeatedly throughout the day.  Protect yourself by wearing long sleeves, pants, a wide-brimmed hat, and sunglasses whenever you are outside. HEART DISEASE, DIABETES, AND HIGH BLOOD PRESSURE   Have your blood pressure checked at least every 1-2 years. High blood pressure causes heart disease and increases the risk of stroke.  If you are between 81 years and 9 years old, ask your health care provider if you should take aspirin to prevent strokes.  Have regular diabetes screenings. This involves taking a blood sample to check your fasting blood sugar level.  If you are at a normal weight and have a low risk for diabetes, have this test once every three years after 64 years of age.  If you are overweight and have a high risk for diabetes, consider being tested at a younger age or more often. PREVENTING INFECTION  Hepatitis B  If you have a higher risk for hepatitis B, you should be screened for this virus. You are considered at high risk for hepatitis B if:  You were born in a country where hepatitis B is common. Ask your health care provider which countries are considered high risk.  Your parents were born in a high-risk country, and you have not been immunized against hepatitis B (hepatitis B vaccine).  You have HIV or AIDS.  You use needles to inject street drugs.  You live with someone who has hepatitis B.  You have had sex with someone who has hepatitis B.  You get hemodialysis treatment.  You  take certain medicines for conditions, including cancer, organ transplantation, and autoimmune conditions. Hepatitis C  Blood testing is recommended for:  Everyone born from [birth year] through 1965.  Anyone with known risk factors for hepatitis C. Sexually transmitted infections (STIs)  You should be screened for sexually transmitted infections (STIs) including gonorrhea and chlamydia if:  You are sexually active and are younger than 64 years of age.  You are older than 64 years of age and your health care provider tells you that you are at risk for this type of infection.  Your sexual activity has changed since you were last screened and you are at an increased risk for chlamydia or gonorrhea. Ask your health care provider if you are at risk.  If you do not have HIV, but are at risk, it may be recommended that you take a prescription medicine daily to prevent HIV infection. This is called pre-exposure prophylaxis (PrEP). You are considered at risk if:  You are sexually active and do not regularly use condoms or know the HIV status of your partner(s).  You take drugs by injection.  You are sexually active with a partner who has HIV. Talk with your health care provider about whether you are at high risk of being infected with HIV. If you choose to begin PrEP, you should first be tested for HIV. You should then be tested every 3 months for as long as you are taking PrEP.  PREGNANCY   If you are premenopausal and you may become pregnant, ask your health care provider about preconception counseling.  If you may become pregnant, take 400 to 800 micrograms (mcg) of folic acid every day.  If you want to prevent pregnancy, talk to your health care provider about birth control (contraception). OSTEOPOROSIS AND MENOPAUSE   Osteoporosis is a disease in which the bones lose minerals and strength with aging. This can result in serious bone fractures. Your risk for osteoporosis can be identified using  a bone density scan.  If you are 47 years of age or older, or if you are at risk for osteoporosis and fractures, ask your health care provider if you should be screened.  Ask your health care provider whether you should take a calcium or vitamin D supplement to lower your risk for osteoporosis.  Menopause may have certain physical symptoms and risks.  Hormone replacement therapy may reduce some of these symptoms and risks. Talk to your health care provider about whether hormone replacement therapy is right for you.  HOME CARE INSTRUCTIONS   Schedule regular health, dental, and eye exams.  Stay current with your immunizations.   Do not use any tobacco products including cigarettes, chewing tobacco, or electronic cigarettes.  If you are pregnant, do not drink alcohol.  If you are breastfeeding, limit how much and how often you drink alcohol.  Limit alcohol intake to no more than 1 drink per day for nonpregnant women. One drink equals 12 ounces of beer, 5 ounces of wine, or 1 ounces of hard liquor.  Do not use street drugs.  Do not share needles.  Ask your health care provider for help if  you need support or information about quitting drugs.  Tell your health care provider if you often feel depressed.  Tell your health care provider if you have ever been abused or do not feel safe at home. Document Released: 02/14/2011 Document Revised: 12/16/2013 Document Reviewed: 07/03/2013 [facility] Patient Information 2015 [place], [place]. This information is not intended to replace advice given to you by your health care provider. Make sure you discuss any questions you have with your health care provider.

## 2015-05-07 ENCOUNTER — Ambulatory Visit: Payer: Commercial Managed Care - HMO | Admitting: Dietician

## 2015-05-07 LAB — LIPID PANEL
Chol/HDL Ratio: 4.7 ratio units — ABNORMAL HIGH (ref 0.0–4.4)
Cholesterol, Total: 265 mg/dL — ABNORMAL HIGH (ref 100–199)
HDL: 56 mg/dL (ref >39)
LDL Calculated: 137 mg/dL — ABNORMAL HIGH (ref 0–99)
Triglycerides: 360 mg/dL — ABNORMAL HIGH (ref 0–149)
VLDL Cholesterol Cal: 72 mg/dL — ABNORMAL HIGH (ref 5–40)

## 2015-05-07 LAB — MICROALBUMIN / CREATININE URINE RATIO
Creatinine, Urine: 88.5 mg/dL
MICROALB/CREAT RATIO: 44.1 mg/g creat — ABNORMAL HIGH (ref 0.0–30.0)
Microalbumin, Urine: 39 ug/mL

## 2015-05-07 LAB — CMP14+EGFR
ALT: 18 IU/L (ref 0–32)
AST: 16 IU/L (ref 0–40)
Albumin/Globulin Ratio: 1.7 (ref 1.1–2.5)
Albumin: 4.3 g/dL (ref 3.6–4.8)
Alkaline Phosphatase: 83 IU/L (ref 39–117)
BUN/Creatinine Ratio: 29 — ABNORMAL HIGH (ref 11–26)
BUN: 31 mg/dL — ABNORMAL HIGH (ref 8–27)
Bilirubin Total: 0.5 mg/dL (ref 0.0–1.2)
CO2: 23 mmol/L (ref 18–29)
Calcium: 10.1 mg/dL (ref 8.7–10.3)
Chloride: 96 mmol/L — ABNORMAL LOW (ref 97–108)
Creatinine, Ser: 1.08 mg/dL — ABNORMAL HIGH (ref 0.57–1.00)
GFR calc Af Amer: 63 mL/min/{1.73_m2} (ref >59)
GFR calc non Af Amer: 54 mL/min/{1.73_m2} — ABNORMAL LOW (ref >59)
Globulin, Total: 2.6 g/dL (ref 1.5–4.5)
Glucose: 288 mg/dL — ABNORMAL HIGH (ref 65–99)
Potassium: 4.6 mmol/L (ref 3.5–5.2)
Sodium: 138 mmol/L (ref 134–144)
Total Protein: 6.9 g/dL (ref 6.0–8.5)

## 2015-05-07 LAB — HEPATITIS C ANTIBODY: Hep C Virus Ab: <0.1 s/co ratio (ref 0.0–0.9)

## 2015-05-07 LAB — THYROID PANEL WITH TSH
Free Thyroxine Index: 2.1 (ref 1.2–4.9)
T3 Uptake Ratio: 27 % (ref 24–39)
T4, Total: 7.6 ug/dL (ref 4.5–12.0)
TSH: 1.25 u[IU]/mL (ref 0.450–4.500)

## 2015-05-13 ENCOUNTER — Ambulatory Visit (INDEPENDENT_AMBULATORY_CARE_PROVIDER_SITE_OTHER): Payer: Commercial Managed Care - HMO | Admitting: Gastroenterology

## 2015-05-13 ENCOUNTER — Encounter: Payer: Self-pay | Admitting: Gastroenterology

## 2015-05-13 VITALS — BP 120/54 | HR 80 | Ht 66.5 in | Wt 206.5 lb

## 2015-05-13 DIAGNOSIS — K5900 Constipation, unspecified: Secondary | ICD-10-CM | POA: Diagnosis not present

## 2015-05-13 DIAGNOSIS — G459 Transient cerebral ischemic attack, unspecified: Secondary | ICD-10-CM

## 2015-05-13 DIAGNOSIS — G8929 Other chronic pain: Secondary | ICD-10-CM

## 2015-05-13 DIAGNOSIS — Z1211 Encounter for screening for malignant neoplasm of colon: Secondary | ICD-10-CM | POA: Diagnosis not present

## 2015-05-13 DIAGNOSIS — R109 Unspecified abdominal pain: Secondary | ICD-10-CM

## 2015-05-13 DIAGNOSIS — R1011 Right upper quadrant pain: Secondary | ICD-10-CM

## 2015-05-13 NOTE — Patient Instructions (Signed)
You have been scheduled for a colonoscopy. Please follow written instructions given to you at your visit today.  Please pick up your prep supplies at the pharmacy within the next 1-3 days. If you use inhalers (even only as needed), please bring them with you on the day of your procedure.   Start Miralax 1-2 doses a day.  Thank you for choosing me and [facility].  Pricilla Riffle. Dagoberto Ligas., MD., Marval Regal

## 2015-05-13 NOTE — Progress Notes (Signed)
History of Present Illness: This is a 64-year-old female returning for evaluation of constipation, chronic intermittent right flank pain and colorectal cancer screening. She previously underwent colonoscopy in October 2005 which was normal. Upper endoscopy in August 2005 showed a distal esophageal stricture which was dilated. She underwent gastric sleeve procedure in 2015. Preprocedure upper endoscopy by Dr. Newman in May 2015 was unremarkable. She suffered a TIA in March 2016 and was hospitalized. She has been treated with full dose aspirin since that time with no difficulties. She has intermittent right flank pain that has been present for at least 5 years about a change in symptoms. CT scan and CTE in 2013 with resolution of slightly thickened proximal small bowel loops noted on the follow-up study. EGD was performed in 12/2013 and she underwent a gastric sleeve procedure in 2015. She's had difficulties with constipation and uses milk of magnesia every few days. She notes occasional mild right flank pain that appears to worsen with movement and is not necessarily related to any digestive function.   Allergies  Allergen Reactions  . Atorvastatin     Myalgias   . Crestor [Rosuvastatin Calcium] Other (See Comments)    Stiffness and back pain  . Ezetimibe-Simvastatin Other (See Comments)    Leg cramps  . Celebrex [Celecoxib] Rash  . Levemir [Insulin Detemir] Rash  . Penicillins Other (See Comments)    SYNCOPE (Tolerated Ancef)   Outpatient Prescriptions Prior to Visit  Medication Sig Dispense Refill  . ACCU-CHEK AVIVA PLUS test strip CHECK BLOOD SUGAR UP TO TWICE DAILY OR AS INSTRUCTED 100 each 2  . aspirin 325 MG tablet Take 325 mg by mouth daily.     . B Complex Vitamins (B-COMPLEX/B-12 SL) Place 500 mcg under the tongue every other day.     . B-D INS SYR ULTRAFINE 1CC/31G 31G X 5/16" 1 ML MISC USE TO INJECT INSULIN TWICE A DAY AS  INSTRUCTED 100 each 2  . Calcium Carbonate-Vitamin D (CALCIUM 600+D) 600-400 MG-UNIT per tablet Take 1 tablet by mouth 2 (two) times daily.    . cetirizine (ZYRTEC) 10 MG tablet Take 10 mg by mouth as needed for allergies.    . insulin glargine (LANTUS) 100 UNIT/ML injection 32 Units 2 (two) times daily.  30 mL 4  . losartan (COZAAR) 25 MG tablet TAKE ONE TABLET BY MOUTH ONE TIME DAILY 90 tablet 1  . meclizine (ANTIVERT) 12.5 MG tablet Take 1 tablet (12.5 mg total) by mouth 3 (three) times daily as needed for dizziness. 30 tablet 1  . metFORMIN (GLUCOPHAGE) 1000 MG tablet Take 1 tablet qam and 1/2 tablet qpm with food 135 tablet 0  . metoprolol (LOPRESSOR) 50 MG tablet TAKE ONE HALF TABLET BY MOUTH TWICE DAILY 90 tablet 1  . Multiple Vitamin (MULTIVITAMIN WITH MINERALS) TABS tablet Take 1 tablet by mouth 2 (two) times daily.    . Na Sulfate-K Sulfate-Mg Sulf (SUPREP BOWEL PREP) SOLN Take 1 kit by mouth once. Name brand only, no substitutions, take as directed 354 mL 0  . Pitavastatin Calcium 4 MG TABS Take 1 tablet (4 mg total) by mouth daily. 30 tablet 2  . sitaGLIPtin (JANUVIA) 100 MG tablet Take 1 tablet (100 mg total) by mouth daily with breakfast. As directed (Patient taking differently: Take 50 mg by mouth daily with breakfast. As directed) 90 tablet 3  . sodium chloride (OCEAN) 0.65 % SOLN nasal spray Place 1 spray into both nostrils as needed for congestion. 15 mL 0  .   Insulin Pen Needle (B-D ULTRAFINE III SHORT PEN) 31G X 8 MM MISC Use to administer insulin daily 100 each 1   No facility-administered medications prior to visit.   Past Medical History  Diagnosis Date  . 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 Hyperlipidemia   . Cystocele   . IBS (irritable bowel syndrome)   . Varicose veins   . Superficial phlebitis   . Esophageal stricture   . Fatty liver   .  Osteopenia   . Vitamin D deficiency   . Dyslipidemia   . GERD (gastroesophageal reflux disease)   . Osteoarthritis   . Pericardial effusion     a. Mod by echo 2012 at time of PNA. b. Echo 07/2012: small pericardial effusion vs fat.  . Hypertension     dr Hochrein  . Obesity   . CAD (coronary artery disease)     a. Nonobstructive by cath 2006 - 25% LAD, 25-30% after 1st diag, distal 25% PDA, minor irreg LCx. b. Normal nuc 2011.  . Hiatal hernia     a. 07/2013: HH with small stricture holding up barium tablet (concides with patient's sx of food sticking).  . 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  . Morbid obesity   . Abnormal Doppler ultrasound of carotid artery     a. 05/2013 - mild plaque, 1-39% BICA, abnormal appearrance of thyroid (TSH wnl in 06/2013).  . Palpitations     a. PVC/PAC on EKG 08/2013.  . Sleep apnea     CPAP currently broke, calling company about machine, pt does not know cpap settings, uses cpap off and on  . Family history of anesthesia complication     daughter slow to awaken after teeth extraction at age 16  . Toe ulcer 01-08-14    Left big toe-being tx. at wound center "is healing"  . PONV (postoperative nausea and vomiting)     n/v, and room spinning after cataract and parotid surgery  . Difficult intubation 09-07-2012    big neck trouble with intubation parotid surgery"takes little med to sedate"  . Dizziness and giddiness 04/14/2014  . Fracture, skull   . Allergy     seasonal  . Cataract     surgery  . TIA (transient ischemic attack) 2016  . Heart murmur   . Osteoporosis   . Stroke 11-07-2014    tia  . Chronic kidney disease     stage 3   Past Surgical History  Procedure Laterality Date  . Carpal tunnel release  5 yrs ago    Left  . Cataract extraction Bilateral yrs ago  . Cardiac  catheterization      06  . Parotidectomy  09/07/2012    Procedure: PAROTIDECTOMY; Surgeon: Dwight Bates, MD; Location: [facility]; Service: ENT; Laterality: Left; LEFT PAROTIDECTOMY  . Breath tek h pylori N/A 07/29/2013    Procedure: BREATH TEK H PYLORI; Surgeon: David H Newman, MD; Location: [facility]; Service: General; Laterality: N/A;  . Tubal ligation  yrs ago  . Tonsillectomy  1968  . Cholecystectomy  1982  . Esophagogastroduodenoscopy N/A 12/27/2013    Procedure: ESOPHAGOGASTRODUODENOSCOPY (EGD); Surgeon: David H Newman, MD; Location: [facility]; Service: General; Laterality: N/A;  . Laparoscopic gastric sleeve resection N/A 01/20/2014    Procedure: LAPAROSCOPIC GASTRIC SLEEVE RESECTION; Surgeon: David H Newman, MD; Location: [facility] ORS; Service: General; Laterality: N/A;  . Upper gi endoscopy  01/20/2014    Procedure:   UPPER GI ENDOSCOPY; Surgeon: David H Newman, MD; Location: [facility]S; Service: General;;  . Eye surgery     Social History   Social History  . Marital Status: Married    Spouse Name: N/A  . Number of Children: 3  . Years of Education: hs   Occupational History  . retired    Social History Main Topics  . Smoking status: Never Smoker   . Smokeless tobacco: Never Used  . Alcohol Use: 0.0 oz/week    0 Standard drinks or equivalent per week     Comment: rarely  . Drug Use: No  . Sexual Activity: Yes    Birth Control/ Protection: Post-menopausal   Other Topics Concern  . None   Social History Narrative   Married      Patient is right handed.   Patient rarely drinks caffeine.   Family History  Problem Relation Age of Onset  . Heart failure Mother   . Hypertension Mother   . Skin cancer Mother   . Colitis Mother   . Diabetes Mother   . Kidney Stones Mother   . Heart disease Father   .  Colon polyps Father   . Diabetes Father   . Diabetes Other   . Heart attack Sister   . Colon cancer Neg Hx   . Diabetes Daughter      Physical Exam: General: Well developed, well nourished, obese, no acute distress Head: Normocephalic and atraumatic Eyes: sclerae anicteric, EOMI Ears: Normal auditory acuity Mouth: No deformity or lesions Lungs: Clear throughout to auscultation Heart: Regular rate and rhythm; no murmurs, rubs or bruits Abdomen: Soft, protuberant, non tender and non distended. No masses, hepatosplenomegaly or hernias noted. Normal Bowel sounds Rectal: deferred to colonoscopy Musculoskeletal: Symmetrical with no gross deformities  Pulses: Normal pulses noted Extremities: No clubbing, cyanosis, edema or deformities noted Neurological: Alert oriented x 4, grossly nonfocal Psychological: Alert and cooperative. Normal mood and affect  Assessment and Recommendations:  1. Right flank pain, chronic, intermittent, unchanged. Etiology unclear-suspected musculoskeletal etiology  2. Constipation. Increase fluid intake and begin MiraLAX once or twice daily on a regular basis. May use milk of magnesia daily as needed.  3. CRC screening, average risk. The risks (including bleeding, perforation, infection, missed lesions, medication reactions and possible hospitalization or surgery if complications occur), benefits, and alternatives to colonoscopy with possible biopsy and possible polypectomy were discussed with the patient and they consent to proceed. History of a difficult intubation.  4. TIA in March 2016. Stable since. Continue full dose aspirin through colonoscopy.         

## 2015-05-14 ENCOUNTER — Ambulatory Visit (INDEPENDENT_AMBULATORY_CARE_PROVIDER_SITE_OTHER): Payer: Commercial Managed Care - HMO

## 2015-05-14 DIAGNOSIS — Z23 Encounter for immunization: Secondary | ICD-10-CM | POA: Diagnosis not present

## 2015-05-16 ENCOUNTER — Encounter: Payer: Self-pay | Admitting: *Deleted

## 2015-06-02 ENCOUNTER — Encounter: Payer: Commercial Managed Care - HMO | Attending: Surgery | Admitting: Dietician

## 2015-06-02 ENCOUNTER — Encounter: Payer: Self-pay | Admitting: Family Medicine

## 2015-06-02 DIAGNOSIS — Z713 Dietary counseling and surveillance: Secondary | ICD-10-CM | POA: Insufficient documentation

## 2015-06-02 DIAGNOSIS — Z6831 Body mass index (BMI) 31.0-31.9, adult: Secondary | ICD-10-CM | POA: Insufficient documentation

## 2015-06-02 DIAGNOSIS — E669 Obesity, unspecified: Secondary | ICD-10-CM

## 2015-06-02 NOTE — Patient Instructions (Addendum)
-  Work on getting 64 oz of fluid per day -Consider keeping a food log -Work on Chartered certified accountant -Refer to pre op diet (focus on meat and non starchy vegetables) -Resume exercise routine

## 2015-06-02 NOTE — Progress Notes (Signed)
  Follow-up visit:  16 months Post-Operative Gastric sleeve Surgery  Medical Nutrition Therapy:  Appt start time: [id number] end time:  1200  Primary concerns today: Post-operative Bariatric Surgery Nutrition Management.   [name] returns today for 16 month post op follow up. She has resumed eating many carbohydrates and HgbA1c is 11%. Has nausea when she eats too much but rarely vomits. Reports that she eats well in the beginning of the day but eats foods that are not recommended in the evenings.    Surgery date: 01/20/2014 Surgery type: Gastric sleeve [facility] weight at [facility]: 251 on 10/02/13 (257 lbs per patient) Weight today: 194 lbs Weight change: 0 lbs (3 lbs fat loss) Total weight lost: 63 lbs  Weight loss goal: 150-160 lbs  TANITA  BODY COMP RESULTS Patient unable to use Tanita at pre op class  02/04/14 03/18/14 04/29/14 07/29/14 11/24/14 06/02/15   BMI (kg/m^2) 34.7 33 31.7 30.4 30.4 Patient declined   Fat Mass (lbs) 103 94.5 88.5 81 78    Fat Free Mass (lbs) 122 116 114 113 116    Total Body Water (lbs) 89.5 85 83.5 82.5 85     Preferred Learning Style:   No preference indicated   Learning Readiness:   Ready  24-hr recall: B (8:30AM): grilled pork chop or bacon with 1 egg and 2 slices of tomato, coffee Snk (AM):  L (2 PM): macaroni salad, apples, and potatoes Snk (PM):  D (PM): meat with beans or vegetables, sometimes mashed potatoes Snk (PM): sugar free candy  Fluid intake: 2 cups decaf coffee, crystal light; patient unsure of total amount Estimated total protein intake: 60g+ per day  Medications: see list Supplementation: taking  CBG monitoring: 1x in the am Average CBG per patient: 200-300 mg/dL Last HgbA1c: 11%  Using straws: once in a while Drinking while eating: no Hair loss: no (dry scalp) Carbonated beverages: diet [place] N/V/D/C: constipation  Dumping syndrome: none  Recent physical activity:  None  Progress Towards Goal(s):  In progress.   Nutritional  Diagnosis:  [place]-3.3 Overweight/obesity related to past poor dietary habits and physical inactivity as evidenced by patient w/ recent gastric sleeve surgery following dietary guidelines for continued weight loss.     Intervention:  Nutrition counseling provided. Encouraged the patient to choose lean meats, vegetables, and small portions of starch. Discussed examples using teachback method.  Handouts provided: MyPlate   Teaching Method Utilized:  Visual Auditory  Barriers to learning/adherence to lifestyle change: toe injury limiting exercise; vertigo  Demonstrated degree of understanding via:  Teach Back   Monitoring/Evaluation:  Dietary intake, exercise, and body weight. Follow up in 6 months for 22 month post-op visit.

## 2015-06-03 ENCOUNTER — Encounter: Payer: Self-pay | Admitting: Dietician

## 2015-06-08 ENCOUNTER — Other Ambulatory Visit: Payer: Self-pay | Admitting: Pharmacist

## 2015-06-10 ENCOUNTER — Other Ambulatory Visit: Payer: Self-pay | Admitting: Pharmacist

## 2015-06-16 ENCOUNTER — Encounter: Payer: Self-pay | Admitting: Family Medicine

## 2015-06-16 ENCOUNTER — Ambulatory Visit (INDEPENDENT_AMBULATORY_CARE_PROVIDER_SITE_OTHER): Payer: Commercial Managed Care - HMO | Admitting: Family Medicine

## 2015-06-16 VITALS — BP 134/70 | HR 64 | Temp 97.3°F | Ht 68.0 in | Wt 209.0 lb

## 2015-06-16 DIAGNOSIS — E785 Hyperlipidemia, unspecified: Secondary | ICD-10-CM

## 2015-06-16 DIAGNOSIS — I10 Essential (primary) hypertension: Secondary | ICD-10-CM | POA: Diagnosis not present

## 2015-06-16 DIAGNOSIS — N183 Chronic kidney disease, stage 3 unspecified: Secondary | ICD-10-CM

## 2015-06-16 DIAGNOSIS — E1122 Type 2 diabetes mellitus with diabetic chronic kidney disease: Secondary | ICD-10-CM

## 2015-06-16 DIAGNOSIS — E559 Vitamin D deficiency, unspecified: Secondary | ICD-10-CM

## 2015-06-16 DIAGNOSIS — G629 Polyneuropathy, unspecified: Secondary | ICD-10-CM

## 2015-06-16 DIAGNOSIS — E1151 Type 2 diabetes mellitus with diabetic peripheral angiopathy without gangrene: Secondary | ICD-10-CM

## 2015-06-16 NOTE — Progress Notes (Signed)
Subjective:    Patient ID: [name], female    DOB: 30-Apr-1951, 64 y.o.   MRN: [id number]  HPI Pt here for follow up and management of chronic medical problems which includes diabetes, hypertension and hyperlipidemia. She is taking medications regularly. The patient is doing well overall. She does complain of some headaches and some tingling sensations in her head. She has had her blood work done and this will be reviewed with her during the visit today. Her blood sugar was elevated at 288 and she had a creatinine that was slightly elevated at 1.08 with electrolytes that were good except for slightly decreased but chloride. Liver function tests were normal. The hemoglobin A1c was 11.0 and this is way out of control and of course these readings were obtained 1 month ago. Cholesterol numbers with traditional lipid testing had an elevated triglyceride and elevated total cholesterol with an LDL C that was 137 and higher than it had been previously. All thyroid tests were normal. The urine microalbumin was 39. The microalbumin creatinine ratio was elevated at 44.1. The hepatitis C viral antibody was low.       Patient Active Problem List   Diagnosis Date Noted  . Low serum vitamin D 05/06/2015  . Vertigo 05/06/2015  . Aphasia 12/08/2014  . TIA (transient ischemic attack) 11/07/2014  . 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 CKD stage 3 due to type 2 diabetes mellitus ([place]) 06/03/2014  . Type II diabetes mellitus with renal manifestations ([place]) 06/03/2014  . Type II diabetes mellitus with peripheral circulatory disorder ([place]) 06/03/2014  . Obesity (BMI 30.0-34.9) 04/24/2014  . Dizziness and giddiness 04/14/2014  . 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 Osteopenia 10/09/2013  . Nonhealing nonsurgical wound 09/17/2013  . Abnormal ankle brachial index 09/11/2013  . Hypomagnesemia 08/01/2013  . DYSPNEA 08/18/2010  . MYOCARDIAL PERFUSION SCAN, WITH STRESS TEST, ABNORMAL  08/04/2010  . ELECTROCARDIOGRAM, ABNORMAL 07/28/2010  . Edema extremities 12/30/2008  . Hyperlipidemia 12/26/2008  . Benign essential HTN 12/26/2008  . CAD 12/26/2008  . PERICARDIAL EFFUSION 12/26/2008  . GERD 12/26/2008  . OSTEOARTHRITIS 12/26/2008   Outpatient Encounter Prescriptions as of 06/16/2015  Medication Sig  . ACCU-CHEK AVIVA PLUS test strip CHECK BLOOD SUGAR UP TO TWICE DAILY OR AS INSTRUCTED  . aspirin 325 MG tablet Take 325 mg by mouth daily.   . B Complex Vitamins (B-COMPLEX/B-12 SL) Place 500 mcg under the tongue every other day.   . B-D INS SYR ULTRAFINE 1CC/31G 31G X 5/16" 1 ML MISC USE TO INJECT INSULIN TWICE A DAY AS INSTRUCTED  . Calcium Carbonate-Vitamin D (CALCIUM 600+D) 600-400 MG-UNIT per tablet Take 1 tablet by mouth 2 (two) times daily.  . cetirizine (ZYRTEC) 10 MG tablet Take 10 mg by mouth as needed for allergies.  Marland Kitchen insulin glargine (LANTUS) 100 UNIT/ML injection 32 Units 2 (two) times daily.   Marland Kitchen LIVALO 4 MG TABS TAKE ONE TABLET BY MOUTH ONE TIME DAILY  . losartan (COZAAR) 25 MG tablet TAKE ONE TABLET BY MOUTH ONE TIME DAILY  . meclizine (ANTIVERT) 12.5 MG tablet Take 1 tablet (12.5 mg total) by mouth 3 (three) times daily as needed for dizziness.  . metFORMIN (GLUCOPHAGE) 1000 MG tablet Take 1 tablet qam and 1/2 tablet qpm with food  . metoprolol (LOPRESSOR) 50 MG tablet TAKE ONE HALF TABLET BY MOUTH TWICE DAILY  . Multiple Vitamin (MULTIVITAMIN WITH MINERALS) TABS tablet Take 1 tablet by mouth 2 (two) times daily.  Marland Kitchen  Na Sulfate-K Sulfate-Mg Sulf (SUPREP BOWEL PREP) SOLN Take 1 kit by mouth once. Name brand only, no substitutions, take as directed  . sitaGLIPtin (JANUVIA) 100 MG tablet Take 1 tablet (100 mg total) by mouth daily with breakfast. As directed (Patient taking differently: Take 100 mg by mouth daily. As directed)  . sodium chloride (OCEAN) 0.65 % SOLN nasal spray Place 1 spray into both nostrils as needed for congestion.   No  facility-administered encounter medications on file as of 06/16/2015.      Review of Systems  Constitutional: Negative.   Eyes: Negative.   Respiratory: Negative.   Cardiovascular: Negative.   Gastrointestinal: Negative.   Endocrine: Negative.   Genitourinary: Negative.   Musculoskeletal: Negative.   Skin: Negative.   Allergic/Immunologic: Negative.   Neurological: Positive for headaches.       Head tingles  Hematological: Negative.   Psychiatric/Behavioral: Negative.        Objective:   Physical Exam  Constitutional: She is oriented to person, place, and time. She appears well-developed and well-nourished. No distress.  HENT:  Head: Normocephalic and atraumatic.  Right Ear: External ear normal.  Left Ear: External ear normal.  Nose: Nose normal.  Mouth/Throat: Oropharynx is clear and moist. No oropharyngeal exudate.  Eyes: Conjunctivae and EOM are normal. Pupils are equal, round, and reactive to light. Right eye exhibits no discharge. Left eye exhibits no discharge. No scleral icterus.  Neck: Normal range of motion. Neck supple. No thyromegaly present.  No anterior cervical adenopathy or carotid bruits  Cardiovascular: Normal rate, regular rhythm and normal heart sounds.   No murmur heard. Pedal pulses in the left foot were difficult to palpate. The heart has a regular rhythm and rate at 72/m  Pulmonary/Chest: Effort normal and breath sounds normal. No respiratory distress. She has no wheezes. She has no rales. She exhibits no tenderness.  Clear anteriorly and posteriorly  Abdominal: Soft. Bowel sounds are normal. She exhibits no mass. There is no tenderness. There is no rebound and no guarding.  There were no masses tenderness or organ enlargement palpable. The patient has a large protuberant abdomen despite the gastric bypass procedure. She is also about 50 pounds of weight. She is going to try to do better with watching her diet.  Musculoskeletal: Normal range of motion.  She exhibits tenderness. She exhibits no edema.  There was slight tenderness on the occipital scalp right greater than left.  Lymphadenopathy:    She has no cervical adenopathy.  Neurological: She is alert and oriented to person, place, and time. She has normal reflexes. No cranial nerve deficit.  Skin: Skin is warm and dry. No rash noted.  Psychiatric: She has a normal mood and affect. Her behavior is normal. Judgment and thought content normal.  Nursing note and vitals reviewed.  BP 134/70 mmHg  Pulse 64  Temp(Src) 97.3 F (36.3 C) (Oral)  Ht 5' 8"  (1.727 m)  Wt 209 lb (94.802 kg)  BMI 31.79 kg/m2        Assessment & Plan:  1. Type II diabetes mellitus with peripheral circulatory disorder (HCC) -The patient's blood sugar remains under poor control as of 1 month ago. She is still getting readings at home of about 200 fasting. She will be scheduled for a visit with the diabetic educator and clinical pharmacist to review her blood sugars and work more aggressively on getting these under control.  2. CKD stage 3 due to type 2 diabetes mellitus ([place]) -The recent creatinine was only  slightly elevated and we will continue to monitor this. The patient understands the importance of getting and keeping blood sugar under good control and blood pressure under good control. She will continue to watch her sodium intake  3. Hyperlipidemia -Cholesterol numbers on traditional lipid testing remain elevated on recent lab work and she will continue with her current treatment and will consider one of the PCS K-9 inhibitors.  4. Vitamin D deficiency -Continue current treatment   5. Benign essential HTN -Continue current treatment  6. Neuropathy ([place]) -The patient will follow-up with the neurologist. -In the interim she will use some warm wet compresses to her posterior neck 20 minutes 3 or 4 times daily and will use nasal saline for the nasal congestion.  No orders of the defined types were  placed in this encounter.   Patient Instructions                       Medicare Annual Wellness Visit  [facility] and the medical providers at [facility] strive to bring you the best medical care.  In doing so we not only want to address your current medical conditions and concerns but also to detect new conditions early and prevent illness, disease and health-related problems.    Medicare offers a yearly Wellness Visit which allows our clinical staff to assess your need for preventative services including immunizations, lifestyle education, counseling to decrease risk of preventable diseases and screening for fall risk and other medical concerns.    This visit is provided free of charge (no copay) for all Medicare recipients. The clinical pharmacists at [facility] have begun to conduct these Wellness Visits which will also include a thorough review of all your medications.    As you primary medical provider recommend that you make an appointment for your Annual Wellness Visit if you have not done so already this year.  You may set up this appointment before you leave today or you may call back ([phone]) and schedule an appointment.  Please make sure when you call that you mention that you are scheduling your Annual Wellness Visit with the clinical pharmacist so that the appointment may be made for the proper length of time.     Continue current medications. Continue good therapeutic lifestyle changes which include good diet and exercise. Fall precautions discussed with patient. If an FOBT was given today- please return it to our front desk. If you are over 16 years old - you may need Prevnar 63 or the adult Pneumonia vaccine.  **Flu shots are available--- please call and schedule a FLU-CLINIC appointment**  After your visit with [place] today you will receive a survey in the mail or online from [employer] regarding your care with [place]. Please take a  moment to fill this out. Your feedback is very important to [place] as you can help [place] better understand your patient needs as well as improve your experience and satisfaction. WE CARE ABOUT YOU!!!   The patient will make arrangements herself to follow-up with the neurologist, Dr. Berton Bon be scheduled to return to the office to visit with the clinical pharmacist to get better blood sugar control and will bring her blood sugar readings with her at that time She understands the importance of getting the blood sugar under good control. She will use nasal saline for sinus congestion and will try using some warm wet compresses to the back of her neck 20 minutes 3  or 4 times daily She prefers to delay getting her shingles shot.   Arrie Senate MD

## 2015-06-16 NOTE — Patient Instructions (Addendum)
Medicare Annual Wellness Visit  [place] and the medical providers at [facility] strive to bring you the best medical care.  In doing so we not only want to address your current medical conditions and concerns but also to detect new conditions early and prevent illness, disease and health-related problems.    Medicare offers a yearly Wellness Visit which allows our clinical staff to assess your need for preventative services including immunizations, lifestyle education, counseling to decrease risk of preventable diseases and screening for fall risk and other medical concerns.    This visit is provided free of charge (no copay) for all Medicare recipients. The clinical pharmacists at [facility] have begun to conduct these Wellness Visits which will also include a thorough review of all your medications.    As you primary medical provider recommend that you make an appointment for your Annual Wellness Visit if you have not done so already this year.  You may set up this appointment before you leave today or you may call back [phone] and schedule an appointment.  Please make sure when you call that you mention that you are scheduling your Annual Wellness Visit with the clinical pharmacist so that the appointment may be made for the proper length of time.     Continue current medications. Continue good therapeutic lifestyle changes which include good diet and exercise. Fall precautions discussed with patient. If an FOBT was given today- please return it to our front desk. If you are over 53 years old - you may need Prevnar 68 or the adult Pneumonia vaccine.  **Flu shots are available--- please call and schedule a FLU-CLINIC appointment**  After your visit with [place] today you will receive a survey in the mail or online from [employer] regarding your care with [place]. Please take a moment to fill this out. Your feedback is very  important to [place] as you can help [place] better understand your patient needs as well as improve your experience and satisfaction. WE CARE ABOUT YOU!!!   The patient will make arrangements herself to follow-up with the neurologist, Dr. Berton Bon be scheduled to return to the office to visit with the clinical pharmacist to get better blood sugar control and will bring her blood sugar readings with her at that time She understands the importance of getting the blood sugar under good control. She will use nasal saline for sinus congestion and will try using some warm wet compresses to the back of her neck 20 minutes 3 or 4 times daily She prefers to delay getting her shingles shot.

## 2015-06-22 ENCOUNTER — Telehealth: Payer: Self-pay | Admitting: Neurology

## 2015-06-22 NOTE — Telephone Encounter (Signed)
Patient called to advise that she had stroke last March and Dr. Jannifer Franklin advised her to call if she has any trouble. Dr. Laurance Flatten felt like Dr. Jannifer Franklin needs to check her again due to pain in forehead, stiff neck and vertigo off and on. Dr. Laurance Flatten just wants to stay ahead of it.

## 2015-06-23 NOTE — Telephone Encounter (Signed)
Called patient to schedule an appt w/ Dr. Jannifer Franklin. No answer. Will try later.

## 2015-06-23 NOTE — Telephone Encounter (Signed)
Okay for revisit.

## 2015-06-24 NOTE — Telephone Encounter (Signed)
Tried reaching patient several times. No answer.

## 2015-06-25 NOTE — Telephone Encounter (Signed)
Tried to reach patient. No answer. PHONE STAFF: if patient calls back ok to schedule within the next couple of weeks; i.e. EMG slot. thanks

## 2015-06-25 NOTE — Telephone Encounter (Signed)
Patient is returning a call to set up an appointment with Dr. Jannifer Franklin before March of next year.

## 2015-06-26 NOTE — Telephone Encounter (Signed)
Pt has an upcoming appt on 07/13/15.

## 2015-07-01 ENCOUNTER — Encounter (HOSPITAL_COMMUNITY): Payer: Self-pay | Admitting: *Deleted

## 2015-07-13 ENCOUNTER — Ambulatory Visit: Payer: Commercial Managed Care - HMO | Admitting: Neurology

## 2015-07-14 ENCOUNTER — Ambulatory Visit (HOSPITAL_COMMUNITY): Payer: Commercial Managed Care - HMO | Admitting: Anesthesiology

## 2015-07-14 ENCOUNTER — Ambulatory Visit (HOSPITAL_COMMUNITY)
Admission: RE | Admit: 2015-07-14 | Discharge: 2015-07-14 | Disposition: A | Discharge status: Home / Self Care | Payer: Commercial Managed Care - HMO | Source: Ambulatory Visit | Attending: Gastroenterology | Admitting: Gastroenterology

## 2015-07-14 ENCOUNTER — Encounter (HOSPITAL_COMMUNITY)
Admission: RE | Disposition: A | Discharge status: Home / Self Care | Payer: Self-pay | Source: Ambulatory Visit | Attending: Gastroenterology

## 2015-07-14 ENCOUNTER — Encounter (HOSPITAL_COMMUNITY): Payer: Self-pay | Admitting: Anesthesiology

## 2015-07-14 DIAGNOSIS — Z6831 Body mass index (BMI) 31.0-31.9, adult: Secondary | ICD-10-CM | POA: Insufficient documentation

## 2015-07-14 DIAGNOSIS — Z1211 Encounter for screening for malignant neoplasm of colon: Secondary | ICD-10-CM | POA: Diagnosis not present

## 2015-07-14 DIAGNOSIS — D125 Benign neoplasm of sigmoid colon: Secondary | ICD-10-CM

## 2015-07-14 DIAGNOSIS — E785 Hyperlipidemia, unspecified: Secondary | ICD-10-CM | POA: Diagnosis not present

## 2015-07-14 DIAGNOSIS — K760 Fatty (change of) liver, not elsewhere classified: Secondary | ICD-10-CM | POA: Insufficient documentation

## 2015-07-14 DIAGNOSIS — M1990 Unspecified osteoarthritis, unspecified site: Secondary | ICD-10-CM | POA: Insufficient documentation

## 2015-07-14 DIAGNOSIS — K219 Gastro-esophageal reflux disease without esophagitis: Secondary | ICD-10-CM | POA: Diagnosis not present

## 2015-07-14 DIAGNOSIS — K5900 Constipation, unspecified: Secondary | ICD-10-CM | POA: Insufficient documentation

## 2015-07-14 DIAGNOSIS — Z9842 Cataract extraction status, left eye: Secondary | ICD-10-CM | POA: Insufficient documentation

## 2015-07-14 DIAGNOSIS — Z7982 Long term (current) use of aspirin: Secondary | ICD-10-CM | POA: Diagnosis not present

## 2015-07-14 DIAGNOSIS — K635 Polyp of colon: Secondary | ICD-10-CM

## 2015-07-14 DIAGNOSIS — Z794 Long term (current) use of insulin: Secondary | ICD-10-CM | POA: Insufficient documentation

## 2015-07-14 DIAGNOSIS — I2510 Atherosclerotic heart disease of native coronary artery without angina pectoris: Secondary | ICD-10-CM | POA: Diagnosis not present

## 2015-07-14 DIAGNOSIS — Z8673 Personal history of transient ischemic attack (TIA), and cerebral infarction without residual deficits: Secondary | ICD-10-CM | POA: Diagnosis not present

## 2015-07-14 DIAGNOSIS — I129 Hypertensive chronic kidney disease with stage 1 through stage 4 chronic kidney disease, or unspecified chronic kidney disease: Secondary | ICD-10-CM | POA: Insufficient documentation

## 2015-07-14 DIAGNOSIS — R109 Unspecified abdominal pain: Secondary | ICD-10-CM | POA: Insufficient documentation

## 2015-07-14 DIAGNOSIS — N183 Chronic kidney disease, stage 3 (moderate): Secondary | ICD-10-CM | POA: Diagnosis not present

## 2015-07-14 DIAGNOSIS — G709 Myoneural disorder, unspecified: Secondary | ICD-10-CM | POA: Insufficient documentation

## 2015-07-14 DIAGNOSIS — K449 Diaphragmatic hernia without obstruction or gangrene: Secondary | ICD-10-CM | POA: Insufficient documentation

## 2015-07-14 DIAGNOSIS — G4730 Sleep apnea, unspecified: Secondary | ICD-10-CM | POA: Insufficient documentation

## 2015-07-14 DIAGNOSIS — Z9884 Bariatric surgery status: Secondary | ICD-10-CM | POA: Diagnosis not present

## 2015-07-14 DIAGNOSIS — Z9841 Cataract extraction status, right eye: Secondary | ICD-10-CM | POA: Diagnosis not present

## 2015-07-14 DIAGNOSIS — Z79899 Other long term (current) drug therapy: Secondary | ICD-10-CM | POA: Insufficient documentation

## 2015-07-14 HISTORY — PX: COLONOSCOPY: SHX5424

## 2015-07-14 LAB — GLUCOSE, CAPILLARY: Glucose-Capillary: 170 mg/dL — ABNORMAL HIGH (ref 65–99)

## 2015-07-14 SURGERY — COLONOSCOPY
Anesthesia: Monitor Anesthesia Care

## 2015-07-14 MED ORDER — LACTATED RINGERS IV SOLN
INTRAVENOUS | Status: DC | PRN
Start: 2015-07-14 — End: 2015-07-14
  Administered 2015-07-14: 09:00:00 via INTRAVENOUS

## 2015-07-14 MED ORDER — PROPOFOL 500 MG/50ML IV EMUL
INTRAVENOUS | Status: DC | PRN
  Administered 2015-07-14: 30 mg via INTRAVENOUS
  Administered 2015-07-14: 20 mg via INTRAVENOUS

## 2015-07-14 MED ORDER — SODIUM CHLORIDE 0.9 % IV SOLN: INTRAVENOUS | Status: DC

## 2015-07-14 MED ORDER — PROPOFOL 500 MG/50ML IV EMUL
INTRAVENOUS | Status: DC | PRN
  Administered 2015-07-14: 100 ug/kg/min via INTRAVENOUS

## 2015-07-14 MED ORDER — PROPOFOL 10 MG/ML IV BOLUS
INTRAVENOUS | Status: AC
  Filled 2015-07-14: qty 40

## 2015-07-14 MED ORDER — ASPIRIN EC 325 MG PO TBEC: 325.0000 mg | DELAYED_RELEASE_TABLET | Freq: Every day | ORAL | Status: DC

## 2015-07-14 NOTE — Anesthesia Postprocedure Evaluation (Signed)
Anesthesia Post Note  Patient: [name]  Procedure(s) Performed: Procedure(s) (LRB): COLONOSCOPY (N/A)  Patient location during evaluation: PACU Anesthesia Type: MAC Level of consciousness: awake and alert Pain management: pain level controlled Vital Signs Assessment: post-procedure vital signs reviewed and stable Respiratory status: spontaneous breathing, nonlabored ventilation, respiratory function stable and patient connected to nasal cannula oxygen Cardiovascular status: stable and blood pressure returned to baseline Anesthetic complications: no    Last Vitals:  Filed Vitals:   07/14/15 1020 07/14/15 1025  BP: 175/82 182/71  Pulse: 68 69  Temp:    Resp: 12 15    Last Pain: There were no vitals filed for this visit.               Osias Resnick J

## 2015-07-14 NOTE — Discharge Instructions (Signed)

## 2015-07-14 NOTE — Anesthesia Preprocedure Evaluation (Signed)
Anesthesia Evaluation  Patient identified by MRN, date of birth, ID band Patient awake    Reviewed: Allergy & Precautions, NPO status , Patient's Chart, lab work & pertinent test results  History of Anesthesia Complications (+) PONV, DIFFICULT AIRWAY, Family history of anesthesia reaction and history of anesthetic complications  Airway Mallampati: II  TM Distance: >3 FB Neck ROM: Full    Dental no notable dental hx.    Pulmonary shortness of breath, sleep apnea ,    Pulmonary exam normal breath sounds clear to auscultation       Cardiovascular hypertension, Pt. on medications and Pt. on home beta blockers + CAD  Normal cardiovascular exam+ Valvular Problems/Murmurs  Rhythm:Regular Rate:Normal     Neuro/Psych TIA Neuromuscular disease CVA negative psych ROS   GI/Hepatic Neg liver ROS, hiatal hernia, GERD  ,  Endo/Other  negative endocrine ROSdiabetes  Renal/GU Renal disease  negative genitourinary   Musculoskeletal  (+) Arthritis ,   Abdominal   Peds negative pediatric ROS (+)  Hematology negative hematology ROS (+)   Anesthesia Other Findings   Reproductive/Obstetrics negative OB ROS                             Anesthesia Physical Anesthesia Plan  ASA: III  Anesthesia Plan: MAC   Post-op Pain Management:    Induction: Intravenous  Airway Management Planned: Natural Airway  Additional Equipment:   Intra-op Plan:   Post-operative Plan:   Informed Consent: I have reviewed the patients History and Physical, chart, labs and discussed the procedure including the risks, benefits and alternatives for the proposed anesthesia with the patient or authorized representative who has indicated his/her understanding and acceptance.   Dental advisory given  Plan Discussed with: CRNA  Anesthesia Plan Comments:         Anesthesia Quick Evaluation

## 2015-07-14 NOTE — Op Note (Signed)
[facility] [place], [address]   COLONOSCOPY PROCEDURE REPORT  PATIENT: Alison, [name]  MR#: [id number] BIRTHDATE: [birth year] , 25  yrs. old GENDER: female ENDOSCOPIST: Ladene Artist, MD, [facility] REFERRED BY:  Redge Gainer, M.D. PROCEDURE DATE:  07/14/2015 PROCEDURE:   Colonoscopy, screening and Colonoscopy with snare polypectomy First Screening Colonoscopy - Avg.  risk and is 50 yrs.  old or older - No.  Prior Negative Screening - Now for repeat screening. 10 or more years since last screening  History of Adenoma - Now for follow-up colonoscopy & has been > or = to 3 yrs.  N/A  Polyps removed today? Yes ASA CLASS:   Class III INDICATIONS:Screening for colonic neoplasia and Colorectal Neoplasm Risk Assessment for this procedure is average risk. MEDICATIONS: Monitored anesthesia care and Per Anesthesia DESCRIPTION OF PROCEDURE:   After the risks benefits and alternatives of the procedure were thoroughly explained, informed consent was obtained.  The digital rectal exam revealed no abnormalities of the rectum.   The Pentax Ped Colon [id number] endoscope was introduced through the anus and advanced to the cecum, which was identified by both the appendix and ileocecal valve. No adverse events experienced.   The quality of the prep was good.  (Suprep was used)  The instrument was then slowly withdrawn as the colon was fully examined. Estimated blood loss is zero unless otherwise noted in this procedure report.    COLON FINDINGS: A pedunculated polyp measuring 9 mm in size was found in the sigmoid colon.  A polypectomy was performed using snare cautery.  The resection was complete, the polyp tissue was completely retrieved and sent to histology.   The colonic mucosa appeared normal at the splenic flexure, in the transverse colon, rectum, descending colon, at the ileocecal valve, cecum, hepatic flexure, and in the ascending colon.  Retroflexed views  revealed no abnormalities. The time to cecum = 1.7 Withdrawal time = 13.4   The scope was withdrawn and the procedure completed. COMPLICATIONS: There were no immediate complications.  ENDOSCOPIC IMPRESSION: 1.   Pedunculated polyp in the sigmoid colon; polypectomy performed using snare cautery 2.   The colonic mucosa otherwise appeared normal  RECOMMENDATIONS: 1.  Hold aspirin, aspirin products, and anti-inflammatory medication for 1 week. 2.  Await pathology results 3.  Repeat colonoscopy in 5 years if polyp adenomatous; otherwise 10 years  eSigned:  Ladene Artist, MD, [facility] 07/14/2015 10:12 AM

## 2015-07-14 NOTE — H&P (Signed)
History of Present Illness: This is a 64 year old female returning for evaluation of constipation, chronic intermittent right flank pain and colorectal cancer screening. She previously underwent colonoscopy in October 2005 which was normal. Upper endoscopy in August 2005 showed a distal esophageal stricture which was dilated. She underwent gastric sleeve procedure in 2015. Preprocedure upper endoscopy by Dr. Lucia Gaskins in May 2015 was unremarkable. She suffered a TIA in March 2016 and was hospitalized. She has been treated with full dose aspirin since that time with no difficulties. She has intermittent right flank pain that has been present for at least 5 years about a change in symptoms. CT scan and CTE in 2013 with resolution of slightly thickened proximal small bowel loops noted on the follow-up study. EGD was performed in 12/2013 and she underwent a gastric sleeve procedure in 2015. She's had difficulties with constipation and uses milk of magnesia every few days. She notes occasional mild right flank pain that appears to worsen with movement and is not necessarily related to any digestive function.   Allergies  Allergen Reactions  . Atorvastatin     Myalgias   . Crestor [Rosuvastatin Calcium] Other (See Comments)    Stiffness and back pain  . Ezetimibe-Simvastatin Other (See Comments)    Leg cramps  . Celebrex [Celecoxib] Rash  . Levemir [Insulin Detemir] Rash  . Penicillins Other (See Comments)    SYNCOPE (Tolerated Ancef)   Outpatient Prescriptions Prior to Visit  Medication Sig Dispense Refill  . ACCU-CHEK AVIVA PLUS test strip CHECK BLOOD SUGAR UP TO TWICE DAILY OR AS INSTRUCTED 100 each 2  . aspirin 325 MG tablet Take 325 mg by mouth daily.     . B Complex Vitamins (B-COMPLEX/B-12 SL) Place 500 mcg under the tongue every other day.     . B-D INS SYR ULTRAFINE 1CC/31G 31G X 5/16" 1 ML MISC USE TO INJECT INSULIN TWICE A DAY AS  INSTRUCTED 100 each 2  . Calcium Carbonate-Vitamin D (CALCIUM 600+D) 600-400 MG-UNIT per tablet Take 1 tablet by mouth 2 (two) times daily.    . cetirizine (ZYRTEC) 10 MG tablet Take 10 mg by mouth as needed for allergies.    Marland Kitchen insulin glargine (LANTUS) 100 UNIT/ML injection 32 Units 2 (two) times daily.  30 mL 4  . losartan (COZAAR) 25 MG tablet TAKE ONE TABLET BY MOUTH ONE TIME DAILY 90 tablet 1  . meclizine (ANTIVERT) 12.5 MG tablet Take 1 tablet (12.5 mg total) by mouth 3 (three) times daily as needed for dizziness. 30 tablet 1  . metFORMIN (GLUCOPHAGE) 1000 MG tablet Take 1 tablet qam and 1/2 tablet qpm with food 135 tablet 0  . metoprolol (LOPRESSOR) 50 MG tablet TAKE ONE HALF TABLET BY MOUTH TWICE DAILY 90 tablet 1  . Multiple Vitamin (MULTIVITAMIN WITH MINERALS) TABS tablet Take 1 tablet by mouth 2 (two) times daily.    . Na Sulfate-K Sulfate-Mg Sulf (SUPREP BOWEL PREP) SOLN Take 1 kit by mouth once. Name brand only, no substitutions, take as directed 354 mL 0  . Pitavastatin Calcium 4 MG TABS Take 1 tablet (4 mg total) by mouth daily. 30 tablet 2  . sitaGLIPtin (JANUVIA) 100 MG tablet Take 1 tablet (100 mg total) by mouth daily with breakfast. As directed (Patient taking differently: Take 50 mg by mouth daily with breakfast. As directed) 90 tablet 3  . sodium chloride (OCEAN) 0.65 % SOLN nasal spray Place 1 spray into both nostrils as needed for congestion. 15 mL 0  .  Insulin Pen Needle (B-D ULTRAFINE III SHORT PEN) 31G X 8 MM MISC Use to administer insulin daily 100 each 1   No facility-administered medications prior to visit.   Past Medical History  Diagnosis Date  . 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 Hyperlipidemia   . Cystocele   . IBS (irritable bowel syndrome)   . Varicose veins   . Superficial phlebitis   . Esophageal stricture   . Fatty liver   .  Osteopenia   . Vitamin D deficiency   . Dyslipidemia   . GERD (gastroesophageal reflux disease)   . Osteoarthritis   . Pericardial effusion     a. Mod by echo 2012 at time of PNA. b. Echo 07/2012: small pericardial effusion vs fat.  . Hypertension     dr Percival Spanish  . Obesity   . CAD (coronary artery disease)     a. Nonobstructive by cath 2006 - 25% LAD, 25-30% after 1st diag, distal 25% PDA, minor irreg LCx. b. Normal nuc 2011.  Marland Kitchen Hiatal hernia     a. 07/2013: HH with small stricture holding up barium tablet (concides with patient's sx of food sticking).  . 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  . Morbid obesity   . Abnormal Doppler ultrasound of carotid artery     a. 05/2013 - mild plaque, 1-39% BICA, abnormal appearrance of thyroid (TSH wnl in 06/2013).  . Palpitations     a. PVC/PAC on EKG 08/2013.  Marland Kitchen Sleep apnea     CPAP currently broke, calling company about machine, pt does not know cpap settings, uses cpap off and on  . Family history of anesthesia complication     daughter slow to awaken after teeth extraction at age 22  . Toe ulcer 01-08-14    Left big toe-being tx. at wound center "is healing"  . PONV (postoperative nausea and vomiting)     n/v, and room spinning after cataract and parotid surgery  . Difficult intubation 09-07-2012    big neck trouble with intubation parotid surgery"takes little med to sedate"  . Dizziness and giddiness 04/14/2014  . Fracture, skull   . Allergy     seasonal  . Cataract     surgery  . TIA (transient ischemic attack) 2016  . Heart murmur   . Osteoporosis   . Stroke 11-07-2014    tia  . Chronic kidney disease     stage 3   Past Surgical History  Procedure Laterality Date  . Carpal tunnel release  5 yrs ago    Left  . Cataract extraction Bilateral yrs ago  . Cardiac  catheterization      06  . Parotidectomy  09/07/2012    Procedure: PAROTIDECTOMY; Surgeon: Melida Quitter, MD; Location: [facility]; Service: ENT; Laterality: Left; LEFT PAROTIDECTOMY  . Breath tek h pylori N/A 07/29/2013    Procedure: BREATH TEK H PYLORI; Surgeon: Shann Medal, MD; Location: [facility]; Service: General; Laterality: N/A;  . Tubal ligation  yrs ago  . Tonsillectomy  1968  . Cholecystectomy  1982  . Esophagogastroduodenoscopy N/A 12/27/2013    Procedure: ESOPHAGOGASTRODUODENOSCOPY (EGD); Surgeon: Shann Medal, MD; Location: [facility]; Service: General; Laterality: N/A;  . Laparoscopic gastric sleeve resection N/A 01/20/2014    Procedure: LAPAROSCOPIC GASTRIC SLEEVE RESECTION; Surgeon: Shann Medal, MD; Location: [facility]; Service: General; Laterality: N/A;  . Upper gi endoscopy  01/20/2014    Procedure:  UPPER GI ENDOSCOPY; Surgeon: Shann Medal, MD; Location: [facility]; Service: General;;  . Eye surgery     Social History   Social History  . Marital Status: Married    Spouse Name: N/A  . Number of Children: 3  . Years of Education: hs   Occupational History  . retired    Social History Main Topics  . Smoking status: Never Smoker   . Smokeless tobacco: Never Used  . Alcohol Use: 0.0 oz/week    0 Standard drinks or equivalent per week     Comment: rarely  . Drug Use: No  . Sexual Activity: Yes    Birth Control/ Protection: Post-menopausal   Other Topics Concern  . None   Social History Narrative   Married      Patient is right handed.   Patient rarely drinks caffeine.   Family History  Problem Relation Age of Onset  . Heart failure Mother   . Hypertension Mother   . Skin cancer Mother   . Colitis Mother   . Diabetes Mother   . Kidney Stones Mother   . Heart disease Father   .  Colon polyps Father   . Diabetes Father   . Diabetes Other   . Heart attack Sister   . Colon cancer Neg Hx   . Diabetes Daughter      Physical Exam: General: Well developed, well nourished, obese, no acute distress Head: Normocephalic and atraumatic Eyes: sclerae anicteric, EOMI Ears: Normal auditory acuity Mouth: No deformity or lesions Lungs: Clear throughout to auscultation Heart: Regular rate and rhythm; no murmurs, rubs or bruits Abdomen: Soft, protuberant, non tender and non distended. No masses, hepatosplenomegaly or hernias noted. Normal Bowel sounds Rectal: deferred to colonoscopy Musculoskeletal: Symmetrical with no gross deformities  Pulses: Normal pulses noted Extremities: No clubbing, cyanosis, edema or deformities noted Neurological: Alert oriented x 4, grossly nonfocal Psychological: Alert and cooperative. Normal mood and affect  Assessment and Recommendations:  1. Right flank pain, chronic, intermittent, unchanged. Etiology unclear-suspected musculoskeletal etiology  2. Constipation. Increase fluid intake and begin MiraLAX once or twice daily on a regular basis. May use milk of magnesia daily as needed.  3. CRC screening, average risk. The risks (including bleeding, perforation, infection, missed lesions, medication reactions and possible hospitalization or surgery if complications occur), benefits, and alternatives to colonoscopy with possible biopsy and possible polypectomy were discussed with the patient and they consent to proceed. History of a difficult intubation.  4. TIA in March 2016. Stable since. Continue full dose aspirin through colonoscopy.

## 2015-07-14 NOTE — Transfer of Care (Signed)
Immediate Anesthesia Transfer of Care Note  Patient: [name]  Procedure(s) Performed: Procedure(s): COLONOSCOPY (N/A)  Patient Location: PACU  Anesthesia Type:MAC  Level of Consciousness:  sedated, patient cooperative and responds to stimulation  Airway & Oxygen Therapy:Patient Spontanous Breathing and Patient connected to face mask oxgen  Post-op Assessment:  Report given to PACU RN and Post -op Vital signs reviewed and stable  Post vital signs:  Reviewed and stable  Last Vitals:  Filed Vitals:   07/14/15 0858  BP: 163/63  Pulse: 68  Temp: 36.9 C  Resp: 17    Complications: No apparent anesthesia complications

## 2015-07-15 ENCOUNTER — Encounter: Payer: Self-pay | Admitting: Gastroenterology

## 2015-07-15 ENCOUNTER — Encounter (HOSPITAL_COMMUNITY): Payer: Self-pay | Admitting: Gastroenterology

## 2015-07-19 ENCOUNTER — Other Ambulatory Visit: Payer: Self-pay | Admitting: Family Medicine

## 2015-07-23 ENCOUNTER — Ambulatory Visit: Payer: Commercial Managed Care - HMO | Admitting: Pharmacist

## 2015-07-24 ENCOUNTER — Other Ambulatory Visit: Payer: Self-pay | Admitting: Family Medicine

## 2015-08-27 ENCOUNTER — Telehealth: Payer: Self-pay

## 2015-08-27 ENCOUNTER — Other Ambulatory Visit: Payer: Self-pay | Admitting: Family Medicine

## 2015-08-27 NOTE — Telephone Encounter (Signed)
error 

## 2015-09-17 ENCOUNTER — Other Ambulatory Visit: Payer: Self-pay | Admitting: Pharmacist

## 2015-10-02 ENCOUNTER — Encounter: Payer: Self-pay | Admitting: Family Medicine

## 2015-10-02 ENCOUNTER — Ambulatory Visit (INDEPENDENT_AMBULATORY_CARE_PROVIDER_SITE_OTHER): Payer: Commercial Managed Care - HMO | Admitting: Pharmacist

## 2015-10-02 ENCOUNTER — Ambulatory Visit (INDEPENDENT_AMBULATORY_CARE_PROVIDER_SITE_OTHER): Payer: Commercial Managed Care - HMO | Admitting: Family Medicine

## 2015-10-02 VITALS — BP 162/71 | HR 79 | Temp 97.4°F | Ht 68.0 in | Wt 206.2 lb

## 2015-10-02 DIAGNOSIS — E1122 Type 2 diabetes mellitus with diabetic chronic kidney disease: Secondary | ICD-10-CM | POA: Diagnosis not present

## 2015-10-02 DIAGNOSIS — Z794 Long term (current) use of insulin: Secondary | ICD-10-CM

## 2015-10-02 DIAGNOSIS — N183 Chronic kidney disease, stage 3 unspecified: Secondary | ICD-10-CM

## 2015-10-02 DIAGNOSIS — R05 Cough: Secondary | ICD-10-CM | POA: Diagnosis not present

## 2015-10-02 DIAGNOSIS — R112 Nausea with vomiting, unspecified: Secondary | ICD-10-CM

## 2015-10-02 DIAGNOSIS — R519 Headache, unspecified: Secondary | ICD-10-CM

## 2015-10-02 DIAGNOSIS — R51 Headache: Secondary | ICD-10-CM | POA: Diagnosis not present

## 2015-10-02 DIAGNOSIS — J189 Pneumonia, unspecified organism: Secondary | ICD-10-CM | POA: Diagnosis not present

## 2015-10-02 DIAGNOSIS — R059 Cough, unspecified: Secondary | ICD-10-CM

## 2015-10-02 LAB — POCT INFLUENZA A/B
Influenza A, POC: NEGATIVE
Influenza B, POC: NEGATIVE

## 2015-10-02 LAB — POCT GLYCOSYLATED HEMOGLOBIN (HGB A1C): Hemoglobin A1C: 12.9

## 2015-10-02 MED ORDER — PROMETHAZINE HCL 12.5 MG PO TABS: 12.5000 mg | ORAL_TABLET | Freq: Four times a day (QID) | ORAL | Status: DC | PRN

## 2015-10-02 MED ORDER — LEVOFLOXACIN 500 MG PO TABS: 500.0000 mg | ORAL_TABLET | Freq: Every day | ORAL | Status: DC

## 2015-10-02 NOTE — Progress Notes (Signed)
   HPI  Patient presents today for acute illness.  Patient's when she's had one week of cough, nasal congestion, headache, and intermittent emesis.  She describes her emesis as nonbloody and nonbilious happening approximately once per day after eating usually. Most of time she is tolerating foods and fluids normally.  She denies any intense dyspnea. She does have central thoracic back pain with deep inspiration and cough She's not had any measured fevers or body aches.  Her grandchild have rapid flu positive influenza Her husband is also similarly ill.  PMH: Smoking status noted ROS: Per HPI  Objective: BP 162/71 mmHg  Pulse 79  Temp(Src) 97.4 F (36.3 C) (Oral)  Ht 5\' 8"  (1.727 m)  Wt 206 lb 3.2 oz (93.532 kg)  BMI 31.36 kg/m2 Gen: NAD, alert, cooperative with exam HEENT: NCAT, nares clear, TMs normal,oropharynx clear CV: RRR, good [id number], 99991111 sytolic murmur Resp: clear, right lower lobe with some coarse expiratory sounds, nonlabored Ext: No edema, warm Neuro: Alert and oriented, No gross deficits  Assessment and plan:  # CAP Clinical Dx Considering lung findings and thoracid pleyuritic pain with 7 days of symps Levaquin F/u with anty concerns Supportive care discussed  Phenergan for nausea, Zofran is not supported on formulary    Orders Placed This Encounter  Procedures  . POCT Influenza A/B    Meds ordered this encounter  Medications  . levofloxacin (LEVAQUIN) 500 MG tablet    Sig: Take 1 tablet (500 mg total) by mouth daily.    Dispense:  7 tablet    Refill:  0    Laroy Apple, MD [facility] 10/02/2015, 4:54 PM

## 2015-10-02 NOTE — Progress Notes (Signed)
Patient ID: [name], female   DOB: 1951/08/10, 65 y.o.   MRN: [id number] Diabetes Follow-Up Visit  Patient presents c/o s/s of flu - chills, cough, N/V.  Discussed with triage and appt given for provider today at [place], PharmD, CPP

## 2015-10-02 NOTE — Patient Instructions (Addendum)

## 2015-10-03 LAB — CMP14+EGFR
ALT: 21 IU/L (ref 0–32)
AST: 20 IU/L (ref 0–40)
Albumin/Globulin Ratio: 1.5 (ref 1.1–2.5)
Albumin: 3.6 g/dL (ref 3.6–4.8)
Alkaline Phosphatase: 90 IU/L (ref 39–117)
BUN/Creatinine Ratio: 21 (ref 11–26)
BUN: 24 mg/dL (ref 8–27)
Bilirubin Total: 0.2 mg/dL (ref 0.0–1.2)
CO2: 20 mmol/L (ref 18–29)
Calcium: 9.1 mg/dL (ref 8.7–10.3)
Chloride: 100 mmol/L (ref 96–106)
Creatinine, Ser: 1.14 mg/dL — ABNORMAL HIGH (ref 0.57–1.00)
GFR calc Af Amer: 59 mL/min/{1.73_m2} — ABNORMAL LOW (ref >59)
GFR calc non Af Amer: 51 mL/min/{1.73_m2} — ABNORMAL LOW (ref >59)
Globulin, Total: 2.4 g/dL (ref 1.5–4.5)
Glucose: 299 mg/dL — ABNORMAL HIGH (ref 65–99)
Potassium: 5 mmol/L (ref 3.5–5.2)
Sodium: 140 mmol/L (ref 134–144)
Total Protein: 6 g/dL (ref 6.0–8.5)

## 2015-10-05 ENCOUNTER — Other Ambulatory Visit: Payer: Self-pay | Admitting: Family Medicine

## 2015-10-07 ENCOUNTER — Other Ambulatory Visit: Payer: Self-pay | Admitting: Family Medicine

## 2015-10-13 ENCOUNTER — Other Ambulatory Visit: Payer: Self-pay | Admitting: Family Medicine

## 2015-10-14 ENCOUNTER — Other Ambulatory Visit: Payer: Self-pay | Admitting: Family Medicine

## 2015-10-15 ENCOUNTER — Other Ambulatory Visit: Payer: Self-pay | Admitting: Family Medicine

## 2015-10-15 ENCOUNTER — Telehealth: Payer: Self-pay | Admitting: Family Medicine

## 2015-10-15 NOTE — Telephone Encounter (Signed)
Done earlier

## 2015-10-16 ENCOUNTER — Encounter: Payer: Self-pay | Admitting: Pharmacist

## 2015-10-16 ENCOUNTER — Ambulatory Visit (INDEPENDENT_AMBULATORY_CARE_PROVIDER_SITE_OTHER): Payer: Commercial Managed Care - HMO | Admitting: Pharmacist

## 2015-10-16 VITALS — BP 112/70 | HR 72 | Ht 68.0 in | Wt 212.0 lb

## 2015-10-16 DIAGNOSIS — E119 Type 2 diabetes mellitus without complications: Secondary | ICD-10-CM

## 2015-10-16 DIAGNOSIS — E669 Obesity, unspecified: Secondary | ICD-10-CM

## 2015-10-16 DIAGNOSIS — Z794 Long term (current) use of insulin: Secondary | ICD-10-CM | POA: Diagnosis not present

## 2015-10-16 DIAGNOSIS — E785 Hyperlipidemia, unspecified: Secondary | ICD-10-CM

## 2015-10-16 MED ORDER — INSULIN GLARGINE 100 UNIT/ML ~~LOC~~ SOLN: 35.0000 [IU] | Freq: Two times a day (BID) | SUBCUTANEOUS | Status: DC

## 2015-10-16 NOTE — Progress Notes (Signed)
Patient ID: [name], female   DOB: 1951-06-10, 65 y.o.   MRN: [id number] Diabetes Follow-Up Visit Chief Complaint:   Chief Complaint  Patient presents with  . Diabetes     Filed Vitals:   10/16/15 1252  BP: 112/70  Pulse: 72   Filed Weights   10/16/15 1252  Weight: 212 lb (96.163 kg)   Body mass index is 32.24 kg/(m^2).   HPI: Patient with type 2 DM treated with insulin.  She had gastric sleeve surgery 01/20/14.  She has had no complications and had lost 56# but per today's weight has lost about 48#.    Currently she is taking Lantus 32 units BID, Januvia 100mg  1 tablet daily (has been out for 1 week because pharmacy has not received / requested Rx), metformin 1000mg  1 tablet qam and 1/2 tablet qpm    BG ranges from 106 to 400 (when has the flu)   A1c was 12.9% (10/01/2105)  Hyperlipidemia - taking  Livalo 4mg  daily  Diet - she admits that the last 2 months she has not been as adherent to prescribed diet.  She is taking MVI and other recommended vitamins regularly.  Continues to see nutritionist every 3 months.  Low fat/carbohydrate diet?  Yes  Nicotine Abuse?  No Medication Compliance?  Yes Exercise?  Less lately due to family illness Alcohol Abuse?  No   Exam Edema:  negative Polyuria:  negative  Polydipsia:  negative Polyphagia:  negative  BMI:  Body mass index is 32.24 kg/(m^2).    Weight changes:  Increased about 4# over last month. General Appearance:  alert, oriented, no acute distress and obese Mood/Affect:  normal   Lab Results  Component Value Date   HGBA1C 12.9 10/02/2015    No results found for: [facility]  Lab Results  Component Value Date   CHOL 265* 05/06/2015   HDL 56 05/06/2015   LDLCALC 137* 05/06/2015   LDLDIRECT 113* 02/26/2013   TRIG 360* 05/06/2015   CHOLHDL 4.7* 05/06/2015      Assessment: 1.  Diabetes  - not adequately controrlled 2.  Blood Pressure.  Controlled 3.  Lipids - due recheck next month 4.  Obesity - patient  was having difficulty with diet and exercise adherence    Recommendations: 1.  Medication recommendations at this time are as follows:    Increased Lantus to 35 units bid  Increase metformin 1000mg  1 tablet bid with food  Continue januvia 100mg  1 tablet each morning (rx was sent to [facility] yesterday)  Discussed future options such as Soliqua (lantus + GLP-1 for QD), Trulicity (weekly) or Jardiance.  If start Haines would replace Januvia and Lantus or Trulicity would stop Januvia.  Will not make any changes yet as patient's insurance will change in 2 months and not sure what will be on formulary.  2.  Reviewed HBG goals:  Fasting 80-130 and 1-2 hour post prandial <180.  Patient is instructed to check BG 3 times per day.    3.  BP goal < 140/85. 4.  LDL goal of < 100, HDL > 40 and TG < 150. 5.  Eye Exam yearly and Dental Exam every 6 months. - eye exam UTD 6.  Dietary recommendations:  Patient is to restart diet recommended by nutritionist with [facility] she has follow up in 1 month 7.  Physical Activity recommendations:  Restart walking daily 8. Return to clinic in 1 months with PCP and 2 months clinical pharmacist / CDE  Time  spent counseling patient:  40 minutes  Referring provider:  Ermalinda Memos, PharmD, CPP, CDE

## 2015-10-27 ENCOUNTER — Ambulatory Visit (INDEPENDENT_AMBULATORY_CARE_PROVIDER_SITE_OTHER): Payer: Commercial Managed Care - HMO | Admitting: Family Medicine

## 2015-10-27 ENCOUNTER — Encounter: Payer: Self-pay | Admitting: Family Medicine

## 2015-10-27 VITALS — BP 119/59 | HR 73 | Temp 97.3°F | Ht 68.0 in | Wt 210.0 lb

## 2015-10-27 DIAGNOSIS — E1122 Type 2 diabetes mellitus with diabetic chronic kidney disease: Secondary | ICD-10-CM

## 2015-10-27 DIAGNOSIS — N183 Chronic kidney disease, stage 3 unspecified: Secondary | ICD-10-CM

## 2015-10-27 DIAGNOSIS — Z1211 Encounter for screening for malignant neoplasm of colon: Secondary | ICD-10-CM | POA: Diagnosis not present

## 2015-10-27 DIAGNOSIS — R7989 Other specified abnormal findings of blood chemistry: Secondary | ICD-10-CM | POA: Diagnosis not present

## 2015-10-27 DIAGNOSIS — E1151 Type 2 diabetes mellitus with diabetic peripheral angiopathy without gangrene: Secondary | ICD-10-CM

## 2015-10-27 DIAGNOSIS — I10 Essential (primary) hypertension: Secondary | ICD-10-CM

## 2015-10-27 DIAGNOSIS — Z903 Acquired absence of stomach [part of]: Secondary | ICD-10-CM | POA: Diagnosis not present

## 2015-10-27 DIAGNOSIS — E559 Vitamin D deficiency, unspecified: Secondary | ICD-10-CM

## 2015-10-27 DIAGNOSIS — E785 Hyperlipidemia, unspecified: Secondary | ICD-10-CM

## 2015-10-27 MED ORDER — SITAGLIPTIN PHOSPHATE 100 MG PO TABS: ORAL_TABLET | ORAL | Status: DC

## 2015-10-27 MED ORDER — METOPROLOL TARTRATE 50 MG PO TABS: 25.0000 mg | ORAL_TABLET | Freq: Two times a day (BID) | ORAL | Status: DC

## 2015-10-27 MED ORDER — INSULIN GLARGINE 100 UNIT/ML ~~LOC~~ SOLN: 35.0000 [IU] | Freq: Two times a day (BID) | SUBCUTANEOUS | Status: DC

## 2015-10-27 MED ORDER — PITAVASTATIN CALCIUM 4 MG PO TABS: 1.0000 | ORAL_TABLET | Freq: Every day | ORAL | Status: DC

## 2015-10-27 MED ORDER — LOSARTAN POTASSIUM 25 MG PO TABS: 25.0000 mg | ORAL_TABLET | Freq: Every day | ORAL | Status: DC

## 2015-10-27 MED ORDER — NYSTATIN 100000 UNIT/ML MT SUSP: 5.0000 mL | Freq: Four times a day (QID) | OROMUCOSAL | Status: DC

## 2015-10-27 MED ORDER — METFORMIN HCL 1000 MG PO TABS: ORAL_TABLET | ORAL | Status: DC

## 2015-10-27 NOTE — Progress Notes (Signed)
Subjective:    Patient ID: [name], female    DOB: 1950-12-01, 65 y.o.   MRN: [id number]  HPI Pt here for follow up and management of chronic medical problems which includes hypertension, hyperlipidemia, and diabetes. She is taking medications regularly.The patient's major complaint today is some mouth sores. She is also requesting refills on several prescriptions. An A1c that was done in February was 12.9% which is really bad. Her next visit to see the clinical pharmacists and diabetic educator is in May. The patient has had a flulike illness and has been very sick and her A1c is very high and she has already seen the clinical pharmacy person and our office. She is May changes by increasing the Lantus and has other changes to make like increasing metformin to 1000 twice daily. She denies any chest pain or shortness of breath other than the flulike illness that she had recently. She denies any chest pain shortness of breath trouble swallowing heartburn indigestion nausea vomiting diarrhea blood in the stool or black tarry bowel movements or problems passing her water.     Patient Active Problem List   Diagnosis Date Noted  . Screening for colon cancer 07/14/2015  . Benign neoplasm of sigmoid colon 07/14/2015  . Low serum vitamin D 05/06/2015  . Vertigo 05/06/2015  . Aphasia 12/08/2014  . TIA (transient ischemic attack) 11/07/2014  . 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 CKD stage 3 due to type 2 diabetes mellitus ([place]) 06/03/2014  . Type II diabetes mellitus with renal manifestations ([place]) 06/03/2014  . Type II diabetes mellitus with peripheral circulatory disorder ([place]) 06/03/2014  . Obesity (BMI 30.0-34.9) 04/24/2014  . Dizziness and giddiness 04/14/2014  . 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 Osteopenia 10/09/2013  . Nonhealing nonsurgical wound 09/17/2013  . Abnormal ankle brachial index 09/11/2013  . Hypomagnesemia 08/01/2013  . DYSPNEA 08/18/2010  .  MYOCARDIAL PERFUSION SCAN, WITH STRESS TEST, ABNORMAL 08/04/2010  . ELECTROCARDIOGRAM, ABNORMAL 07/28/2010  . Edema extremities 12/30/2008  . Hyperlipidemia 12/26/2008  . Benign essential HTN 12/26/2008  . CAD 12/26/2008  . PERICARDIAL EFFUSION 12/26/2008  . GERD 12/26/2008  . OSTEOARTHRITIS 12/26/2008   Outpatient Encounter Prescriptions as of 10/27/2015  Medication Sig  . ACCU-CHEK AVIVA PLUS test strip CHECK BLOOD SUGAR UP TO TWICE DAILY OR AS INSTRUCTED  . aspirin EC 325 MG tablet Take 1 tablet (325 mg total) by mouth daily.  . B Complex Vitamins (B-COMPLEX/B-12 SL) Place 500 mcg under the tongue every other day.   . B-D INS SYR ULTRAFINE 1CC/31G 31G X 5/16" 1 ML MISC USE TO INJECT INSULIN TWICE A DAY AS INSTRUCTED  . Calcium Carbonate-Vitamin D (CALCIUM 600+D) 600-400 MG-UNIT per tablet Take 1 tablet by mouth 2 (two) times daily.  . cetirizine (ZYRTEC) 10 MG tablet Take 10 mg by mouth as needed for allergies.  . cholecalciferol (VITAMIN D) 1000 UNITS tablet Take 2,000 Units by mouth daily.  . hydroxypropyl methylcellulose / hypromellose (ISOPTO TEARS / GONIOVISC) 2.5 % ophthalmic solution Place 1 drop into both eyes daily as needed for dry eyes.  . insulin glargine (LANTUS) 100 UNIT/ML injection Inject 0.35 mLs (35 Units total) into the skin 2 (two) times daily.  Marland Kitchen losartan (COZAAR) 25 MG tablet Take 1 tablet (25 mg total) by mouth daily.  . meclizine (ANTIVERT) 12.5 MG tablet Take 1 tablet (12.5 mg total) by mouth 3 (three) times daily as needed for dizziness.  . metFORMIN (GLUCOPHAGE) 1000 MG tablet TAKE  1 TABLET EVERY MORNING AND 1/2 TABLET EVERY EVENING WITH FOOD  . metoprolol (LOPRESSOR) 50 MG tablet Take 0.5 tablets (25 mg total) by mouth 2 (two) times daily.  . Multiple Vitamin (MULTIVITAMIN WITH MINERALS) TABS tablet Take 1 tablet by mouth 2 (two) times daily.  . Pitavastatin Calcium (LIVALO) 4 MG TABS Take 1 tablet (4 mg total) by mouth daily.  . sitaGLIPtin (JANUVIA) 100 MG  tablet TAKE 1 TABLET (100 MG TOTAL) BY MOUTH DAILY WITH BREAKFAST. AS DIRECTE D  . sodium chloride (OCEAN) 0.65 % SOLN nasal spray Place 1 spray into both nostrils as needed for congestion.  . [DISCONTINUED] insulin glargine (LANTUS) 100 UNIT/ML injection Inject 0.35 mLs (35 Units total) into the skin 2 (two) times daily.  . [DISCONTINUED] JANUVIA 100 MG tablet TAKE 1 TABLET (100 MG TOTAL) BY MOUTH DAILY WITH BREAKFAST. AS DIRECTE D  . [DISCONTINUED] LIVALO 4 MG TABS TAKE ONE TABLET BY MOUTH ONE TIME DAILY  . [DISCONTINUED] losartan (COZAAR) 25 MG tablet TAKE ONE TABLET BY MOUTH ONE TIME DAILY  . [DISCONTINUED] metFORMIN (GLUCOPHAGE) 1000 MG tablet TAKE 1 TABLET EVERY MORNING AND 1/2 TABLET EVERY EVENING WITH FOOD  . [DISCONTINUED] metoprolol (LOPRESSOR) 50 MG tablet TAKE ONE HALF TABLET BY MOUTH TWICE DAILY   No facility-administered encounter medications on file as of 10/27/2015.      Review of Systems  Constitutional: Negative.   HENT: Positive for mouth sores.   Eyes: Negative.   Respiratory: Negative.   Cardiovascular: Negative.   Gastrointestinal: Negative.   Endocrine: Negative.   Genitourinary: Negative.   Musculoskeletal: Negative.   Skin: Negative.   Allergic/Immunologic: Negative.   Neurological: Negative.   Hematological: Negative.   Psychiatric/Behavioral: Negative.        Objective:   Physical Exam  Constitutional: She is oriented to person, place, and time. She appears well-developed and well-nourished. No distress.  HENT:  Head: Normocephalic and atraumatic.  Right Ear: External ear normal.  Left Ear: External ear normal.  Nose: Nose normal.  Mouth/Throat: Oropharynx is clear and moist.  No sign of any yeast noted in the mouth or tongue  Eyes: Conjunctivae and EOM are normal. Pupils are equal, round, and reactive to light. Right eye exhibits no discharge. Left eye exhibits no discharge. No scleral icterus.  Neck: Normal range of motion. Neck supple. No  thyromegaly present.  Cardiovascular: Normal rate, regular rhythm and normal heart sounds.  Exam reveals no gallop and no friction rub.   No murmur heard. The pulses in the extremities of the feet continue to be diminished in the left foot greater than the right foot. The heart is regular at 72/m  Pulmonary/Chest: Effort normal and breath sounds normal. No respiratory distress. She has no wheezes. She has no rales.  Abdominal: Soft. Bowel sounds are normal. She exhibits no mass. There is no tenderness. There is no rebound and no guarding.  Musculoskeletal: Normal range of motion. She exhibits no edema or tenderness.  Lymphadenopathy:    She has no cervical adenopathy.  Neurological: She is alert and oriented to person, place, and time. She has normal reflexes. No cranial nerve deficit.  Skin: Skin is warm and dry. No rash noted.  The patient has dry skin in general.  Psychiatric: She has a normal mood and affect. Her behavior is normal. Judgment and thought content normal.  Nursing note and vitals reviewed.   BP 119/59 mmHg  Pulse 73  Temp(Src) 97.3 F (36.3 C) (Oral)  Ht 5\' 8"  (  1.727 m)  Wt 210 lb (95.255 kg)  BMI 31.94 kg/m2       Assessment & Plan:  1. Vitamin D deficiency -Continue current treatment pending results of future lab work  2. Benign essential HTN -The blood pressure is good today and she should continue with current treatment  3. Hyperlipidemia -Continue aggressive therapeutic lifestyle changes with diet and exercise and lowering the blood sugar  4. Special screening for malignant neoplasms, colon - Fecal occult blood, imunochemical; Future  5. Type 2 diabetes mellitus with diabetic chronic kidney disease, unspecified CKD stage, unspecified long term insulin use status (HCC) -The hemoglobin A1c is extremely elevated and she should follow-up with the changes recommended by clinical pharmacy in a couple weeks and any further changes that need to be made will be  made at that time.  6. Type II diabetes mellitus with peripheral circulatory disorder (HCC) -Monitor feet closely for any infections or sores  7. 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 CKD stage 3 due to type 2 diabetes mellitus ([place]) -Continue good blood pressure control and must improve on blood sugar control  9. Low serum vitamin D - check vitamin D at next visit  10. Mouth sores -Use Mycostatin oral suspension swish 1 teaspoon after meals and at bedtime and expectorate  Meds ordered this encounter  Medications  . insulin glargine (LANTUS) 100 UNIT/ML injection    Sig: Inject 0.35 mLs (35 Units total) into the skin 2 (two) times daily.    Dispense:  60 mL    Refill:  3  . sitaGLIPtin (JANUVIA) 100 MG tablet    Sig: TAKE 1 TABLET (100 MG TOTAL) BY MOUTH DAILY WITH BREAKFAST. AS DIRECTE D    Dispense:  90 tablet    Refill:  3  . Pitavastatin Calcium (LIVALO) 4 MG TABS    Sig: Take 1 tablet (4 mg total) by mouth daily.    Dispense:  90 tablet    Refill:  3  . losartan (COZAAR) 25 MG tablet    Sig: Take 1 tablet (25 mg total) by mouth daily.    Dispense:  90 tablet    Refill:  3  . metFORMIN (GLUCOPHAGE) 1000 MG tablet    Sig: TAKE 1 TABLET EVERY MORNING AND 1/2 TABLET EVERY EVENING WITH FOOD    Dispense:  135 tablet    Refill:  3  . metoprolol (LOPRESSOR) 50 MG tablet    Sig: Take 0.5 tablets (25 mg total) by mouth 2 (two) times daily.    Dispense:  90 tablet    Refill:  3  . nystatin (MYCOSTATIN) 100000 UNIT/ML suspension    Sig: Take 5 mLs (500,000 Units total) by mouth 4 (four) times daily. For 1 week.    Dispense:  60 mL    Refill:  0   Patient Instructions                       Medicare Annual Wellness Visit  [facility] and the medical providers at [facility] strive to bring you the best medical care.  In doing so we not only want to address your current medical  conditions and concerns but also to detect new conditions early and prevent illness, disease and health-related problems.    Medicare offers a yearly Wellness Visit which allows our clinical staff to assess your need for preventative services including immunizations, lifestyle education,  counseling to decrease risk of preventable diseases and screening for fall risk and other medical concerns.    This visit is provided free of charge (no copay) for all Medicare recipients. The clinical pharmacists at [facility] have begun to conduct these Wellness Visits which will also include a thorough review of all your medications.    As you primary medical provider recommend that you make an appointment for your Annual Wellness Visit if you have not done so already this year.  You may set up this appointment before you leave today or you may call back [phone] and schedule an appointment.  Please make sure when you call that you mention that you are scheduling your Annual Wellness Visit with the clinical pharmacist so that the appointment may be made for the proper length of time.     Continue current medications. Continue good therapeutic lifestyle changes which include good diet and exercise. Fall precautions discussed with patient. If an FOBT was given today- please return it to our front desk. If you are over 16 years old - you may need Prevnar 48 or the adult Pneumonia vaccine.  Flu shots are available--- please call and schedule a FLU-CLINIC appointment  After your visit with [place] today you will receive a survey in the mail or online from [employer] regarding your care with [place]. Please take a moment to fill this out. Your feedback is very important to [place] as you can help [place] better understand your patient needs as well as improve your experience and satisfaction. WE CARE ABOUT YOU!!!   Follow-up with clinical pharmacy as planned Bring blood sugars in in a couple weeks after  increasing metformin to 1000 twice a day and continuing with Lantus 35 twice daily and Januvia Try to resist the ice cream and attempt patient's of eating more in the evening Continue to walk and exercise Keep follow-up appointment with neurology for memory issues following the CVA Check blood sugars regularly Drink plenty of water    Arrie Senate MD

## 2015-10-27 NOTE — Patient Instructions (Addendum)
Medicare Annual Wellness Visit  [facility] and the medical providers at [facility] strive to bring you the best medical care.  In doing so we not only want to address your current medical conditions and concerns but also to detect new conditions early and prevent illness, disease and health-related problems.    Medicare offers a yearly Wellness Visit which allows our clinical staff to assess your need for preventative services including immunizations, lifestyle education, counseling to decrease risk of preventable diseases and screening for fall risk and other medical concerns.    This visit is provided free of charge (no copay) for all Medicare recipients. The clinical pharmacists at [facility] have begun to conduct these Wellness Visits which will also include a thorough review of all your medications.    As you primary medical provider recommend that you make an appointment for your Annual Wellness Visit if you have not done so already this year.  You may set up this appointment before you leave today or you may call back [phone] and schedule an appointment.  Please make sure when you call that you mention that you are scheduling your Annual Wellness Visit with the clinical pharmacist so that the appointment may be made for the proper length of time.     Continue current medications. Continue good therapeutic lifestyle changes which include good diet and exercise. Fall precautions discussed with patient. If an FOBT was given today- please return it to our front desk. If you are over 65 years old - you may need Prevnar 68 or the adult Pneumonia vaccine.  Flu shots are available--- please call and schedule a FLU-CLINIC appointment  After your visit with [place] today you will receive a survey in the mail or online from [employer] regarding your care with [place]. Please take a moment to fill this out. Your feedback is very important  to [place] as you can help [place] better understand your patient needs as well as improve your experience and satisfaction. WE CARE ABOUT YOU!!!   Follow-up with clinical pharmacy as planned Bring blood sugars in in a couple weeks after increasing metformin to 1000 twice a day and continuing with Lantus 35 twice daily and Januvia Try to resist the ice cream and attempt patient's of eating more in the evening Continue to walk and exercise Keep follow-up appointment with neurology for memory issues following the CVA Check blood sugars regularly Drink plenty of water

## 2015-11-15 ENCOUNTER — Other Ambulatory Visit: Payer: Self-pay | Admitting: Family Medicine

## 2015-11-16 ENCOUNTER — Other Ambulatory Visit: Payer: Self-pay | Admitting: Family Medicine

## 2015-11-26 ENCOUNTER — Encounter: Payer: Self-pay | Admitting: Neurology

## 2015-11-26 ENCOUNTER — Ambulatory Visit (INDEPENDENT_AMBULATORY_CARE_PROVIDER_SITE_OTHER): Payer: Commercial Managed Care - HMO | Admitting: Neurology

## 2015-11-26 ENCOUNTER — Other Ambulatory Visit: Payer: Self-pay | Admitting: Family Medicine

## 2015-11-26 VITALS — BP 135/64 | HR 79 | Ht 68.0 in | Wt 210.0 lb

## 2015-11-26 DIAGNOSIS — R413 Other amnesia: Secondary | ICD-10-CM

## 2015-11-26 DIAGNOSIS — E538 Deficiency of other specified B group vitamins: Secondary | ICD-10-CM

## 2015-11-26 DIAGNOSIS — R42 Dizziness and giddiness: Secondary | ICD-10-CM

## 2015-11-26 HISTORY — DX: Other amnesia: R41.3

## 2015-11-26 NOTE — Progress Notes (Signed)
Reason for visit: Memory disorder  Referring physician: Dr. Earl Lagos [name] is a 65 y.o. female  History of present illness:  [name] is a 65 year old right-handed white female with a history of obesity and diabetes. The patient was seen previously following a severe headache and a aphasia syndrome that occurred approximately one year ago. Brain MRI did not show evidence of a stroke, but she did have some stenosis of the left P2 segment. The patient indicates that since that time she has had some difficulty with word finding, and difficulty remembering names for people. The patient has some mild alteration in her ability to keep up with medications and appointments, but generally she does well with this. She denies any issues with directions while driving. She does have about 3 or 4 episodes a year of some issues and vertigo that responds to meclizine, the episodes of dizziness are usually associated with popping sensations in the ears and fullness in the ears. The patient denies any new numbness or weakness of the face, arms, or legs. She has had a history of obstructive sleep apnea in the past, but she is no longer on CPAP as she has lost some weight. She denies any significant excessive fatigue or drowsiness during the day. She has not had any significant change in balance. She comes to the office today for an evaluation. She has not given up any activities of daily living because of memory.  Past Medical History  Diagnosis Date  . IDDM (insulin dependent diabetes mellitus) ([place])     type 2   . Hyperlipidemia   . Cystocele   . IBS (irritable bowel syndrome)   . Varicose veins   . Superficial phlebitis   . Esophageal stricture   . Fatty liver   . Osteopenia   . Vitamin D deficiency   . Dyslipidemia   . GERD (gastroesophageal reflux disease)   . Osteoarthritis   . Pericardial effusion     a. Mod by echo 2012 at time of PNA. b. Echo 07/2012: small pericardial effusion vs  fat.  . Hypertension     dr Percival Spanish  . Obesity   . CAD (coronary artery disease)     a. Nonobstructive by cath 2006 - 25% LAD, 25-30% after 1st diag, distal 25% PDA, minor irreg LCx. b. Normal nuc 2011.  Marland Kitchen Hiatal hernia     a. 07/2013: HH with small stricture holding up barium tablet (concides with patient's sx of food sticking).  . 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  . Morbid obesity ([place])   . Abnormal Doppler ultrasound of carotid artery     a. 05/2013 - mild plaque, 1-39% BICA, abnormal appearrance of thyroid (TSH wnl in 06/2013).  . Palpitations     a. PVC/PAC on EKG 08/2013.  Marland Kitchen Sleep apnea     CPAP currently broke, calling company about machine, pt does not know cpap settings, uses cpap off and on  . Family history of anesthesia complication     daughter slow to awaken after teeth extraction at age 40  . Toe ulcer ([place]) 01-08-14    Left big toe-being tx. at wound center "is healing"  . PONV (postoperative nausea and vomiting)     n/v, and room spinning after cataract and parotid surgery  . Difficult intubation 09-07-2012    big neck trouble with intubation parotid surgery"takes little med to sedate"  . Dizziness and giddiness 04/14/2014  .  Fracture, skull ([place])   . Allergy     seasonal  . Cataract     surgery  . TIA (transient ischemic attack) 2016  . Heart murmur   . Osteoporosis   . Stroke [facility]) 11-07-2014    tia  . Chronic kidney disease     stage 3  . Memory difficulty 11/26/2015    Past Surgical History  Procedure Laterality Date  . Carpal tunnel release  5 yrs ago    Left  . Cataract extraction Bilateral yrs ago  . Cardiac catheterization      06  . Parotidectomy  09/07/2012    Procedure: PAROTIDECTOMY;  Surgeon: Melida Quitter, MD;  Location: [facility];  Service: ENT;  Laterality: Left;  LEFT PAROTIDECTOMY  . Breath tek h pylori N/A 07/29/2013    Procedure: BREATH TEK H PYLORI;  Surgeon: Shann Medal, MD;  Location: [facility];  Service:  General;  Laterality: N/A;  . Tubal ligation  yrs ago  . Tonsillectomy  1968  . Cholecystectomy  1982  . Esophagogastroduodenoscopy N/A 12/27/2013    Procedure: ESOPHAGOGASTRODUODENOSCOPY (EGD);  Surgeon: Shann Medal, MD;  Location: [facility];  Service: General;  Laterality: N/A;  . Laparoscopic gastric sleeve resection N/A 01/20/2014    Procedure: LAPAROSCOPIC GASTRIC SLEEVE RESECTION;  Surgeon: Shann Medal, MD;  Location: [facility];  Service: General;  Laterality: N/A;  . Upper gi endoscopy  01/20/2014    Procedure: UPPER GI ENDOSCOPY;  Surgeon: Shann Medal, MD;  Location: [facility];  Service: General;;  . Eye surgery    . Colonoscopy N/A 07/14/2015    Procedure: COLONOSCOPY;  Surgeon: Ladene Artist, MD;  Location: [facility];  Service: Endoscopy;  Laterality: N/A;    Family History  Problem Relation Age of Onset  . Heart failure Mother   . Hypertension Mother   . Skin cancer Mother   . Colitis Mother   . Diabetes Mother   . Kidney Stones Mother   . Heart disease Father   . Colon polyps Father   . Diabetes Father   . Diabetes Other   . Heart attack Sister   . Colon cancer Neg Hx   . Diabetes Daughter     Social history:  reports that she has never smoked. She has never used smokeless tobacco. She reports that she drinks alcohol. She reports that she does not use illicit drugs.  Medications:  Prior to Admission medications   Medication Sig Start Date End Date Taking? Authorizing Provider  ACCU-CHEK AVIVA PLUS test strip CHECK BLOOD SUGAR UP TO TWICE DAILY OR AS INSTRUCTED 02/23/15   Chipper Herb, MD  aspirin EC 325 MG tablet Take 1 tablet (325 mg total) by mouth daily. 07/19/15   Ladene Artist, MD  B Complex Vitamins (B-COMPLEX/B-12 SL) Place 500 mcg under the tongue every other day.     Historical Provider, MD  B-D INS SYR ULTRAFINE 1CC/31G 31G X 5/16" 1 ML MISC USE TO INJECT INSULIN TWICE A DAY AS INSTRUCTED 11/16/15   Chipper Herb, MD  Calcium Carbonate-Vitamin D  (CALCIUM 600+D) 600-400 MG-UNIT per tablet Take 1 tablet by mouth 2 (two) times daily. 05/06/15   Tammy Eckard, PHARMD  cetirizine (ZYRTEC) 10 MG tablet Take 10 mg by mouth as needed for allergies.    Historical Provider, MD  cholecalciferol (VITAMIN D) 1000 UNITS tablet Take 2,000 Units by mouth daily.    Historical Provider, MD  hydroxypropyl methylcellulose / hypromellose (ISOPTO TEARS /  GONIOVISC) 2.5 % ophthalmic solution Place 1 drop into both eyes daily as needed for dry eyes.    Historical Provider, MD  LANTUS 100 UNIT/ML injection INJECT UP TO 30 UNITS SUBCUTANEOUSLY TWICE A DAY 11/16/15   Chipper Herb, MD  losartan (COZAAR) 25 MG tablet Take 1 tablet (25 mg total) by mouth daily. 10/27/15   Chipper Herb, MD  meclizine (ANTIVERT) 12.5 MG tablet Take 1 tablet (12.5 mg total) by mouth 3 (three) times daily as needed for dizziness. 01/30/15   Chipper Herb, MD  metFORMIN (GLUCOPHAGE) 1000 MG tablet TAKE 1 TABLET EVERY MORNING AND 1/2 TABLET EVERY EVENING WITH FOOD 10/27/15   Chipper Herb, MD  metoprolol (LOPRESSOR) 50 MG tablet Take 0.5 tablets (25 mg total) by mouth 2 (two) times daily. 10/27/15   Chipper Herb, MD  Multiple Vitamin (MULTIVITAMIN WITH MINERALS) TABS tablet Take 1 tablet by mouth 2 (two) times daily.    Historical Provider, MD  nystatin (MYCOSTATIN) 100000 UNIT/ML suspension Take 5 mLs (500,000 Units total) by mouth 4 (four) times daily. For 1 week. 10/27/15   Chipper Herb, MD  Pitavastatin Calcium (LIVALO) 4 MG TABS Take 1 tablet (4 mg total) by mouth daily. 10/27/15   Chipper Herb, MD  sitaGLIPtin (JANUVIA) 100 MG tablet TAKE 1 TABLET (100 MG TOTAL) BY MOUTH DAILY WITH BREAKFAST. AS DIRECTE D 10/27/15   Chipper Herb, MD  sodium chloride (OCEAN) 0.65 % SOLN nasal spray Place 1 spray into both nostrils as needed for congestion. 11/09/14   Domenic Polite, MD      Allergies  Allergen Reactions  . Atorvastatin     Myalgias   . Crestor [Rosuvastatin Calcium] Other (See  Comments)    Stiffness and back pain  . Ezetimibe-Simvastatin Other (See Comments)    Leg cramps  . Celebrex [Celecoxib] Rash  . Levemir [Insulin Detemir] Rash  . Penicillins Other (See Comments)    SYNCOPE (Tolerated Ancef) Has patient had a PCN reaction causing immediate rash, facial/tongue/throat swelling, SOB or lightheadedness with hypotension: Yes Has patient had a PCN reaction causing severe rash involving mucus membranes or skin necrosis: No Has patient had a PCN reaction that required hospitalization No Has patient had a PCN reaction occurring within the last 10 years: No If all of the above answers are "NO", then may proceed with Cephalosporin use.    ROS:  Out of a complete 14 system review of symptoms, the patient complains only of the following symptoms, and all other reviewed systems are negative.  Memory problems Episodic headache  Blood pressure 135/64, pulse 79, height 5\' 8"  (1.727 m), weight 210 lb (95.255 kg).  Physical Exam  General: The patient is alert and cooperative at the time of the examination. The patient is markedly obese.  Eyes: Pupils are equal, round, and reactive to light. Discs are flat bilaterally.  Neck: The neck is supple, no carotid bruits are noted.  Respiratory: The respiratory examination is clear.  Cardiovascular: The cardiovascular examination reveals a regular rate and rhythm, no obvious murmurs or rubs are noted.  Skin: Extremities are without significant edema. Varicose pains are noted in the legs below the knees bilaterally.  Neurologic Exam  Mental status: The patient is alert and oriented x 3 at the time of the examination. The patient has apparent normal recent and remote memory, with an apparently normal attention span and concentration ability. Mini-Mental Status Examination done today shows a total score of 30/30. The patient  is able to name 14 animals in 30 seconds.  Cranial nerves: Facial symmetry is present. There is  good sensation of the face to pinprick and soft touch bilaterally. The strength of the facial muscles and the muscles to head turning and shoulder shrug are normal bilaterally. Speech is well enunciated, no aphasia or dysarthria is noted. Extraocular movements are full. Visual fields are full. The tongue is midline, and the patient has symmetric elevation of the soft palate. No obvious hearing deficits are noted.  Motor: The motor testing reveals 5 over 5 strength of all 4 extremities. Good symmetric motor tone is noted throughout.  Sensory: Sensory testing is intact to pinprick, soft touch, vibration sensation, and position sense on all 4 extremities. No evidence of extinction is noted.  Coordination: Cerebellar testing reveals good finger-nose-finger and heel-to-shin bilaterally.  Gait and station: Gait is normal. Tandem gait is normal. Romberg is negative. No drift is seen.  Reflexes: Deep tendon reflexes are symmetric, but are depressed bilaterally. Toes are downgoing bilaterally.   Assessment/Plan:  1. Reported memory disturbance  2. Previous episode of aphasia, headache  The patient reports onset of some difficulty with word finding and remembering names for people following the stroke like event that occurred approximately one year ago. She does not believe that there has been significant progression since that time. The patient will be sent for blood work today, she will follow-up in 7 or 8 months and the memory issues will be reassessed at that time. If progression is noted, medications for memory will be offered.  Jill Alexanders MD 11/26/2015 7:31 PM  [facility] [address] [place], [place] [address]  Phone [phone] Fax [phone]

## 2015-11-28 LAB — COPPER, SERUM: Copper: 102 ug/dL (ref 72–166)

## 2015-11-28 LAB — SEDIMENTATION RATE: Sed Rate: 7 mm/hr (ref 0–40)

## 2015-11-28 LAB — VITAMIN B12: Vitamin B-12: 1153 pg/mL — ABNORMAL HIGH (ref 211–946)

## 2015-11-28 LAB — RPR: RPR Ser Ql: NONREACTIVE

## 2015-11-30 ENCOUNTER — Telehealth: Payer: Self-pay | Admitting: *Deleted

## 2015-11-30 NOTE — Telephone Encounter (Signed)
Tried calling home number. Unable to LVM.  Called mobile number. LVM for pt to call about results. Gave [facility] phone number. Ok to inform pt labs unremarkable per Dr Jannifer Franklin.

## 2015-11-30 NOTE — Telephone Encounter (Signed)
Pt returned call. She was told her labs were unremarkable. She expressed understanding.

## 2015-11-30 NOTE — Telephone Encounter (Signed)
-----   Message from Kathrynn Ducking, MD sent at 11/29/2015  7:56 PM EDT -----  The blood work results are unremarkable. Please call the patient.  ----- Message -----    From: [facility] Lab Results In Interface    Sent: 11/27/2015   7:43 AM      To: Kathrynn Ducking, MD

## 2015-12-01 ENCOUNTER — Ambulatory Visit: Payer: Commercial Managed Care - HMO | Admitting: Dietician

## 2015-12-21 ENCOUNTER — Ambulatory Visit: Payer: Self-pay | Admitting: Pharmacist

## 2015-12-31 ENCOUNTER — Ambulatory Visit (INDEPENDENT_AMBULATORY_CARE_PROVIDER_SITE_OTHER): Payer: Medicare Other | Admitting: Pharmacist

## 2015-12-31 ENCOUNTER — Encounter: Payer: Self-pay | Admitting: Pharmacist

## 2015-12-31 VITALS — BP 122/62 | HR 72 | Ht 68.0 in | Wt 210.0 lb

## 2015-12-31 DIAGNOSIS — E1122 Type 2 diabetes mellitus with diabetic chronic kidney disease: Secondary | ICD-10-CM

## 2015-12-31 DIAGNOSIS — Z1211 Encounter for screening for malignant neoplasm of colon: Secondary | ICD-10-CM

## 2015-12-31 LAB — GLUCOSE HEMOCUE WAIVED: Glu Hemocue Waived: 192 mg/dL — ABNORMAL HIGH (ref 65–99)

## 2015-12-31 LAB — BAYER DCA HB A1C WAIVED: HB A1C (BAYER DCA - WAIVED): 11.2 % — ABNORMAL HIGH (ref <7.0)

## 2015-12-31 MED ORDER — EMPAGLIFLOZIN 10 MG PO TABS: 10.0000 mg | ORAL_TABLET | Freq: Every day | ORAL | Status: DC

## 2015-12-31 MED ORDER — INSULIN GLARGINE 100 UNIT/ML ~~LOC~~ SOLN: 38.0000 [IU] | Freq: Two times a day (BID) | SUBCUTANEOUS | Status: DC

## 2015-12-31 NOTE — Addendum Note (Signed)
Addended by: Zannie Cove on: 12/31/2015 02:34 PM   Modules accepted: Orders

## 2015-12-31 NOTE — Patient Instructions (Signed)
Stop Januvia  Start Jardiance '10mg'$  = take 1 tablet daily.   Ask pharmacy benefits about coverage of Soliqua (daily injection that would replace Lantus and possibly Jardiance)  Diabetes and Standards of Medical Care   Diabetes is complicated. You may find that your diabetes team includes a dietitian, nurse, diabetes educator, eye doctor, and more. To help everyone know what is going on and to help you get the care you deserve, the following schedule of care was developed to help keep you on track. Below are the tests, exams, vaccines, medicines, education, and plans you will need.  Blood Glucose Goals Prior to meals = 80 - 130 Within 2 hours of the start of a meal = less than 180  HbA1c test (goal is less than 7.0% - your last value was 11.2%) This test shows how well you have controlled your glucose over the past 2 to 3 months. It is used to see if your diabetes management plan needs to be adjusted.   It is performed at least 2 times a year if you are meeting treatment goals.  It is performed 4 times a year if therapy has changed or if you are not meeting treatment goals.  Blood pressure test  This test is performed at every routine medical visit. The goal is less than 140/90 mmHg for most people, but 130/80 mmHg in some cases. Ask your health care provider about your goal.  Dental exam  Follow up with the dentist regularly.  Eye exam  If you are diagnosed with type 1 diabetes as a child, get an exam upon reaching the age of 31 years or older and have had diabetes for 3 to 5 years. Yearly eye exams are recommended after that initial eye exam.  If you are diagnosed with type 1 diabetes as an adult, get an exam within 5 years of diagnosis and then yearly.  If you are diagnosed with type 2 diabetes, get an exam as soon as possible after the diagnosis and then yearly.  Foot care exam  Visual foot exams are performed at every routine medical visit. The exams check for cuts, injuries,  or other problems with the feet.  A comprehensive foot exam should be done yearly. This includes visual inspection as well as assessing foot pulses and testing for loss of sensation.  Check your feet nightly for cuts, injuries, or other problems with your feet. Tell your health care provider if anything is not healing.  Kidney function test (urine microalbumin)  This test is performed once a year.  Type 1 diabetes: The first test is performed 5 years after diagnosis.  Type 2 diabetes: The first test is performed at the time of diagnosis.  A serum creatinine and estimated glomerular filtration rate (eGFR) test is done once a year to assess the level of chronic kidney disease (CKD), if present.  Lipid profile (cholesterol, HDL, LDL, triglycerides)  Performed every 5 years for most people.  The goal for LDL is less than 100 mg/dL. If you are at high risk, the goal is less than 70 mg/dL.  The goal for HDL is 40 mg/dL to 50 mg/dL for men and 50 mg/dL to 60 mg/dL for women. An HDL cholesterol of 60 mg/dL or higher gives some protection against heart disease.  The goal for triglycerides is less than 150 mg/dL.  Influenza vaccine, pneumococcal vaccine, and hepatitis B vaccine  The influenza vaccine is recommended yearly.  The pneumococcal vaccine is generally given once in a  lifetime. However, there are some instances when another vaccination is recommended. Check with your health care provider.  The hepatitis B vaccine is also recommended for adults with diabetes.  Diabetes self-management education  Education is recommended at diagnosis and ongoing as needed.  Treatment plan  Your treatment plan is reviewed at every medical visit.  Document Released: 05/29/2009 Document Revised: 04/03/2013 Document Reviewed: 01/01/2013 [facility] Patient Information 2014 [place].

## 2015-12-31 NOTE — Progress Notes (Signed)
Patient ID: [name], female   DOB: Dec 18, 1950, 65 y.o.   MRN: [id number] Diabetes Follow-Up Visit Chief Complaint:   Chief Complaint  Patient presents with  . Diabetes     Filed Vitals:   12/31/15 1240  BP: 122/62  Pulse: 72   Filed Weights   12/31/15 1240  Weight: 210 lb (95.255 kg)   Body mass index is 31.94 kg/(m^2).   HPI: Patient with type 2 DM treated with insulin.  She had gastric sleeve surgery 01/20/14.  She has had no complications and had lost 56# but per today's weight has lost about 50#.    Currently she is taking Lantus 35 units BID, Januvia 100mg  1 tablet daily,  metformin 1000mg  1 tablet bid    BG ranges from 154 to 306    A1c was 12.9% (10/01/2105) A1c today was 11.2%  Hyperlipidemia - taking  Livalo 4mg  daily  Diet - she admits that the last 2 months she has not been as adherent to prescribed diet.  She is taking MVI and other recommended vitamins regularly.  Continues to see nutritionist every 3 months - next appt 01/04/2016  Low fat/carbohydrate diet?  Yes  Nicotine Abuse?  No Medication Compliance?  Yes Exercise?  No - due to vertigo (she is having w/u by neurologist currently) Alcohol Abuse?  No   Exam Edema:  negative Polyuria:  negative  Polydipsia:  negative Polyphagia:  negative  BMI:  Body mass index is 31.94 kg/(m^2).    Weight changes:  Decreased about 2# over last 2 months. General Appearance:  alert, oriented, no acute distress and obese Mood/Affect:  normal   Lab Results  Component Value Date   HGBA1c 11.2 12/31/2015   HGBA1C 12.9 10/02/2015    No results found for: [facility]  Lab Results  Component Value Date   CHOL 265* 05/06/2015   HDL 56 05/06/2015   LDLCALC 137* 05/06/2015   LDLDIRECT 113* 02/26/2013   TRIG 360* 05/06/2015   CHOLHDL 4.7* 05/06/2015      Assessment: 1.  Diabetes  - not adequately controlled 2.  Blood Pressure.  Controlled 3.  Lipids - due recheck but not fasting today 4.  Obesity - patient  was having difficulty with diet and exercise adherence    Recommendations: 1.  Medication recommendations at this time are as follows:    Increased Lantus to 38 units bid  Continue  metformin 1000mg  1 tablet bid with food  Change januvia 100mg  to Jardiance 10mg   - take 1 tablet daily - gave #28 samples.   Reviewed patient's new insurance formulary -   Soliqua (lantus + GLP-1) is not listed as covered or not covered, Trulicity (weekly) - tier 3 and Jardiance is tier 3.   2.  Reviewed HBG goals:  Fasting 80-130 and 1-2 hour post prandial <180.  Patient is instructed to check BG 3 times per day.    3.  BP goal < 140/85. 4.  LDL goal of < 100, HDL > 40 and TG < 150. 5.  Eye Exam yearly and Dental Exam every 6 months. - eye exam UTD  Discussed prevnar 13 vaccine - patient asked to post pone until after vertigo improves. 6.  Dietary recommendations:  Patient is to restart diet recommended by nutritionist with [facility] she has follow up in 1 week 7.  Physical Activity recommendations:  Restart walking daily as able 8. Return to clinic in 1 month to check BMET and lipids  Time spent  counseling patient:  35 minutes  Referring provider:  Ermalinda Memos, PharmD, CPP, CDE

## 2016-01-03 LAB — FECAL OCCULT BLOOD, IMMUNOCHEMICAL: Fecal Occult Bld: NEGATIVE

## 2016-01-04 ENCOUNTER — Encounter: Payer: Medicare Other | Attending: Surgery | Admitting: Dietician

## 2016-01-04 ENCOUNTER — Telehealth: Payer: Self-pay | Admitting: Family Medicine

## 2016-01-04 ENCOUNTER — Encounter: Payer: Self-pay | Admitting: Dietician

## 2016-01-04 DIAGNOSIS — E1122 Type 2 diabetes mellitus with diabetic chronic kidney disease: Secondary | ICD-10-CM | POA: Diagnosis not present

## 2016-01-04 DIAGNOSIS — E1169 Type 2 diabetes mellitus with other specified complication: Secondary | ICD-10-CM

## 2016-01-04 DIAGNOSIS — Z794 Long term (current) use of insulin: Secondary | ICD-10-CM

## 2016-01-04 DIAGNOSIS — N189 Chronic kidney disease, unspecified: Secondary | ICD-10-CM | POA: Insufficient documentation

## 2016-01-04 NOTE — Patient Instructions (Addendum)
-  Develop an exercise routine  -[place] sure [place]  -Pre portion snacks in the evenings and add a protein food  -Protein snacks: cheese stick, Greek yogurt, cottage cheese  -Keep practicing eating 1/2 portions

## 2016-01-04 NOTE — Telephone Encounter (Signed)
Referral made 

## 2016-01-04 NOTE — Progress Notes (Signed)
  Follow-up visit:  2 years Post-Operative Gastric sleeve Surgery  Medical Nutrition Therapy:  Appt start time: [id number] end time: 1115  Primary concerns today: Post-operative Bariatric Surgery Nutrition Management.   [name] returns today for sleeve gastrectomy follow up visit. She declined an updated weight today, but states that her weight has been stable. HgbA1c has continued to rise; Metformin increased and Jardiance added. Patient states that her blood sugars have been better in the past few days. She has been struggling with vertigo but has not had any falls. [name] does not eat much throughout the day and feels like this causes her to want to snack more in the evenings.    Surgery date: 01/20/2014 Surgery type: Gastric sleeve [facility] weight at [facility]: 251 on 10/02/13 (257 lbs per patient) Weight today: 194 lbs Weight change: 0 lbs (3 lbs fat loss) Total weight lost: 63 lbs  Weight loss goal: 150-160 lbs   Preferred Learning Style:   No preference indicated   Learning Readiness:   Ready  24-hr recall: B (8:30AM): grilled pork chop with 1 egg, hashbrowns, and unsweet tea or coffee with sweet n low Snk (AM):  L (2 PM): macaroni salad, apples, and potatoes Snk (PM):  D (PM): snacks: cheese curls, peanut butter candy, cashews Snk (PM):   Fluid intake: 2 cups decaf coffee, crystal light, unsweet tea; patient unsure of total amount Estimated total protein intake: 60g+ per day  Medications: see list Supplementation: taking  CBG monitoring: 1x in the am Average CBG per patient: under 200 mg/dL Last HgbA1c: 12.9% in February 2017  Using straws: once in a while Drinking while eating: no Hair loss: no (dry scalp) Carbonated beverages: diet [place] N/V/D/C: constipation  Dumping syndrome: none  Recent physical activity:  None  Progress Towards Goal(s):  In progress.   Nutritional Diagnosis:  [place]-3.3 Overweight/obesity related to past poor dietary habits and physical inactivity  as evidenced by patient w/ recent gastric sleeve surgery following dietary guidelines for continued weight loss.     Intervention:  Nutrition counseling provided. Encouraged the patient to choose lean meats, vegetables, and small portions of starch. Discussed examples using teachback method.  Handouts provided: MyPlate   Teaching Method Utilized:  Visual Auditory  Barriers to learning/adherence to lifestyle change: toe injury limiting exercise; vertigo  Demonstrated degree of understanding via:  Teach Back   Monitoring/Evaluation:  Dietary intake, exercise, and body weight. Follow up in 6 months for 2.5 year post-op visit.

## 2016-01-13 ENCOUNTER — Encounter: Payer: Self-pay | Admitting: Cardiology

## 2016-01-13 ENCOUNTER — Ambulatory Visit (INDEPENDENT_AMBULATORY_CARE_PROVIDER_SITE_OTHER): Payer: Medicare Other | Admitting: Cardiology

## 2016-01-13 VITALS — BP 132/80 | HR 84 | Ht 67.5 in | Wt 209.0 lb

## 2016-01-13 DIAGNOSIS — I10 Essential (primary) hypertension: Secondary | ICD-10-CM | POA: Diagnosis not present

## 2016-01-13 NOTE — Patient Instructions (Signed)
Medication Instructions:  The current medical regimen is effective;  continue present plan and medications.  Testing/Procedures: Your physician has requested that you have an echocardiogram in February 2018. Echocardiography is a painless test that uses sound waves to create images of your heart. It provides your doctor with information about the size and shape of your heart and how well your heart's chambers and valves are working. This procedure takes approximately one hour. There are no restrictions for this procedure.  Follow-Up: Follow up in 1 year with Dr. Percival Spanish.  You will receive a letter in the mail 2 months before you are due.  Please call us when you receive this letter to schedule your follow up appointment.  If you need a refill on your cardiac medications before your next appointment, please call your pharmacy.  Thank you for choosing [facility]!!

## 2016-01-13 NOTE — Progress Notes (Signed)
HPI The patient presents for evaluation of aortic stenosis.  She was hospitalized last year with possible TIA. Echocardiogram demonstrated moderate aortic stenosis with small stable pericardial effusion. Carotids were normal. Since I saw her last after that event she has had no new complaints.  The patient denies any new symptoms such as chest discomfort, neck or arm discomfort. There has been no new shortness of breath, PND or orthopnea. There have been no reported palpitations, presyncope or syncope.  She is not exercising as much as I would like.   Allergies  Allergen Reactions  . Atorvastatin     Myalgias   . Crestor [Rosuvastatin Calcium] Other (See Comments)    Stiffness and back pain  . Ezetimibe-Simvastatin Other (See Comments)    Leg cramps  . Celebrex [Celecoxib] Rash  . Levemir [Insulin Detemir] Rash  . Penicillins Other (See Comments)    SYNCOPE (Tolerated Ancef) Has patient had a PCN reaction causing immediate rash, facial/tongue/throat swelling, SOB or lightheadedness with hypotension: Yes Has patient had a PCN reaction causing severe rash involving mucus membranes or skin necrosis: No Has patient had a PCN reaction that required hospitalization No Has patient had a PCN reaction occurring within the last 10 years: No If all of the above answers are "NO", then may proceed with Cephalosporin use.    Current Outpatient Prescriptions  Medication Sig Dispense Refill  . aspirin EC 325 MG tablet Take 1 tablet (325 mg total) by mouth daily. 30 tablet 0  . B Complex Vitamins (B-COMPLEX/B-12 SL) Place 500 mcg under the tongue every other day.     . Calcium Carbonate-Vitamin D (CALCIUM 600+D) 600-400 MG-UNIT per tablet Take 1 tablet by mouth 2 (two) times daily.    . cetirizine (ZYRTEC) 10 MG tablet Take 10 mg by mouth as needed for allergies.    . cholecalciferol (VITAMIN D) 1000 UNITS tablet Take 2,000 Units by mouth daily.    . empagliflozin (JARDIANCE) 10 MG TABS tablet Take  10 mg by mouth daily. 28 tablet 0  . insulin glargine (LANTUS) 100 UNIT/ML injection Inject 0.38 mLs (38 Units total) into the skin 2 (two) times daily. 30 mL 1  . LIVALO 4 MG TABS TAKE ONE TABLET BY MOUTH ONE TIME DAILY 30 tablet 4  . losartan (COZAAR) 25 MG tablet TAKE ONE TABLET BY MOUTH ONE TIME DAILY 90 tablet 1  . meclizine (ANTIVERT) 12.5 MG tablet Take 1 tablet (12.5 mg total) by mouth 3 (three) times daily as needed for dizziness. 30 tablet 1  . metFORMIN (GLUCOPHAGE) 1000 MG tablet TAKE 1 TABLET EVERY MORNING AND 1/2 TABLET EVERY EVENING WITH FOOD 135 tablet 3  . metoprolol (LOPRESSOR) 50 MG tablet Take 0.5 tablets (25 mg total) by mouth 2 (two) times daily. 90 tablet 3  . Multiple Vitamin (MULTIVITAMIN WITH MINERALS) TABS tablet Take 1 tablet by mouth 2 (two) times daily.    Marland Kitchen ACCU-CHEK AVIVA PLUS test strip CHECK BLOOD SUGAR UP TO TWICE DAILY OR AS INSTRUCTED 100 each 2  . B-D INS SYR ULTRAFINE 1CC/31G 31G X 5/16" 1 ML MISC USE TO INJECT INSULIN TWICE A DAY AS INSTRUCTED 100 each 2  . hydroxypropyl methylcellulose / hypromellose (ISOPTO TEARS / GONIOVISC) 2.5 % ophthalmic solution Place 1 drop into both eyes daily as needed for dry eyes. Reported on 01/13/2016    . sodium chloride (OCEAN) 0.65 % SOLN nasal spray Place 1 spray into both nostrils as needed for congestion. (Patient not taking: Reported on 01/13/2016)  15 mL 0  . [DISCONTINUED] Olmesartan-Amlodipine-HCTZ (TRIBENZOR) 40-5-12.5 MG TABS Take 1 tablet by mouth daily.       No current facility-administered medications for this visit.    Past Medical History  Diagnosis Date  . 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 Hyperlipidemia   . Cystocele   . IBS (irritable bowel syndrome)   . Varicose veins   . Superficial phlebitis   . Esophageal stricture   . Fatty liver   . Osteopenia   . Vitamin D deficiency   . Dyslipidemia   . GERD (gastroesophageal reflux disease)   . Osteoarthritis   .  Pericardial effusion     a. Mod by echo 2012 at time of PNA. b. Echo 07/2012: small pericardial effusion vs fat.  . Hypertension     dr Percival Spanish  . Obesity   . CAD (coronary artery disease)     a. Nonobstructive by cath 2006 - 25% LAD, 25-30% after 1st diag, distal 25% PDA, minor irreg LCx. b. Normal nuc 2011.  Marland Kitchen Hiatal hernia     a. 07/2013: HH with small stricture holding up barium tablet (concides with patient's sx of food sticking).  . 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  . Morbid obesity ([place])   . Abnormal Doppler ultrasound of carotid artery     a. 05/2013 - mild plaque, 1-39% BICA, abnormal appearrance of thyroid (TSH wnl in 06/2013).  . Palpitations     a. PVC/PAC on EKG 08/2013.  Marland Kitchen Sleep apnea     CPAP currently broke, calling company about machine, pt does not know cpap settings, uses cpap off and on  . Family history of anesthesia complication     daughter slow to awaken after teeth extraction at age 38  . Toe ulcer ([place]) 01-08-14    Left big toe-being tx. at wound center "is healing"  . PONV (postoperative nausea and vomiting)     n/v, and room spinning after cataract and parotid surgery  . Difficult intubation 09-07-2012    big neck trouble with intubation parotid surgery"takes little med to sedate"  . Dizziness and giddiness 04/14/2014  . Fracture, skull ([place])   . Allergy     seasonal  . Cataract     surgery  . TIA (transient ischemic attack) 2016  . Heart murmur   . Osteoporosis   . Stroke [facility]) 11-07-2014    tia  . Chronic kidney disease     stage 3  . Memory difficulty 11/26/2015    Past Surgical History  Procedure Laterality Date  . Carpal tunnel release  5 yrs ago    Left  . Cataract extraction Bilateral yrs ago  . Cardiac catheterization      06  . Parotidectomy  09/07/2012    Procedure: PAROTIDECTOMY;  Surgeon: Melida Quitter, MD;  Location: [place];  Service: ENT;  Laterality: Left;  LEFT PAROTIDECTOMY  . Breath tek h pylori N/A  07/29/2013    Procedure: BREATH TEK H PYLORI;  Surgeon: Shann Medal, MD;  Location: [facility];  Service: General;  Laterality: N/A;  . Tubal ligation  yrs ago  . Tonsillectomy  1968  . Cholecystectomy  1982  . Esophagogastroduodenoscopy N/A 12/27/2013    Procedure: ESOPHAGOGASTRODUODENOSCOPY (EGD);  Surgeon: Shann Medal, MD;  Location: [facility];  Service: General;  Laterality: N/A;  . Laparoscopic gastric sleeve resection N/A 01/20/2014    Procedure: LAPAROSCOPIC  GASTRIC SLEEVE RESECTION;  Surgeon: Shann Medal, MD;  Location: [facility];  Service: General;  Laterality: N/A;  . Upper gi endoscopy  01/20/2014    Procedure: UPPER GI ENDOSCOPY;  Surgeon: Shann Medal, MD;  Location: [facility];  Service: General;;  . Eye surgery    . Colonoscopy N/A 07/14/2015    Procedure: COLONOSCOPY;  Surgeon: Ladene Artist, MD;  Location: [facility];  Service: Endoscopy;  Laterality: N/A;    ROS:  As stated in the HPI and negative for all other systems.  PHYSICAL EXAM BP 132/80 mmHg  Pulse 84  Ht 5' 7.5" (1.715 m)  Wt 209 lb (94.802 kg)  BMI 32.23 kg/m2 GENERAL:  Well appearing NECK:  No jugular venous distention, waveform within normal limits, carotid upstroke brisk and symmetric, no bruits, positive transmitted systolic murmur no thyromegaly LUNGS:  Clear to auscultation bilaterally CHEST:  Unremarkable HEART:  PMI not displaced or sustained,S1 and S2 within normal limits, no S3, no S4, no clicks, no rubs, apical early peaking systolic murmur ABD:  Flat, positive bowel sounds normal in frequency in pitch, no bruits, no rebound, no guarding, no midline pulsatile mass, no hepatomegaly, no splenomegaly, obese EXT:  2 plus pulses throughout, positive mild edema, no cyanosis no clubbing  EKG:    Sinus rhythm, rate 96, axis within normal limits, intervals within normal limits, lateral nonspecific T-wave changes unchanged.  01/13/2016   ASSESSMENT AND PLAN  AORTIC STENOSIS - This is moderate  and not causing any symptoms. I will follow clinically  HYPERTENSION -  Her blood pressure is at target.  Obesity -  She has done well following bariatric surgery but hit a plateau with her weight loss.   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 No change in therapy is indicated.   PERICARDIAL EFFUSION - I will follow this with an echo in Feb.   PALPITATIONS - She is currently not describing this.  No change in therapy is indicated.

## 2016-01-18 ENCOUNTER — Encounter: Payer: Self-pay | Admitting: Family Medicine

## 2016-01-18 ENCOUNTER — Ambulatory Visit (INDEPENDENT_AMBULATORY_CARE_PROVIDER_SITE_OTHER): Payer: Medicare Other | Admitting: Family Medicine

## 2016-01-18 VITALS — BP 140/79 | HR 106 | Temp 97.9°F | Ht 67.5 in | Wt 206.8 lb

## 2016-01-18 DIAGNOSIS — L03115 Cellulitis of right lower limb: Secondary | ICD-10-CM | POA: Diagnosis not present

## 2016-01-18 MED ORDER — SULFAMETHOXAZOLE-TRIMETHOPRIM 800-160 MG PO TABS: 1.0000 | ORAL_TABLET | Freq: Two times a day (BID) | ORAL | Status: DC

## 2016-01-18 NOTE — Progress Notes (Signed)
BP 140/79 mmHg  Pulse 106  Temp(Src) 97.9 F (36.6 C) (Oral)  Ht 5' 7.5" (1.715 m)  Wt 206 lb 12.8 oz (93.804 kg)  BMI 31.89 kg/m2   Subjective:    Patient ID: [name], female    DOB: 1950-12-19, 65 y.o.   MRN: [id number]  HPI: [name] is a 65 y.o. female presenting on 01/18/2016 for Lacerations to right knee and Toe Pain   HPI Wounds and erythema Patient comes in today for lacerations or wounds that she has on her right anterior shin just below her knee. She fell one week ago on [place] and scraped the front of her shin and knee and has these 3 spots that don't seem to want to heal and have developed erythema surrounding them and pain. She did have more swelling the last couple days but has been using a lot of ice to reduce the swelling which is helped the pain some. She denies any fevers or chills. Also on the fall she hit her right big toe and feels like she might a fractured it. It is somewhat painful but she is able to ambulate on it without any issue.  Relevant past medical, surgical, family and social history reviewed and updated as indicated. Interim medical history since our last visit reviewed. Allergies and medications reviewed and updated.  Review of Systems  Constitutional: Negative for fever and chills.  HENT: Negative for congestion, ear discharge and ear pain.   Eyes: Negative for redness and visual disturbance.  Respiratory: Negative for chest tightness and shortness of breath.   Cardiovascular: Negative for chest pain and leg swelling.  Genitourinary: Negative for dysuria and difficulty urinating.  Musculoskeletal: Positive for arthralgias. Negative for back pain and gait problem.  Skin: Positive for color change and wound. Negative for rash.  Neurological: Negative for light-headedness and headaches.  Psychiatric/Behavioral: Negative for behavioral problems and agitation.  All other systems reviewed and are negative.   Per HPI unless  specifically indicated above     Medication List       This list is accurate as of: 01/18/16  5:15 PM.  Always use your most recent med list.               ACCU-CHEK AVIVA PLUS test strip  Generic drug:  glucose blood  CHECK BLOOD SUGAR UP TO TWICE DAILY OR AS INSTRUCTED     aspirin EC 325 MG tablet  Take 1 tablet (325 mg total) by mouth daily.     B-COMPLEX/B-12 SL  Place 500 mcg under the tongue every other day.     B-D INS SYR ULTRAFINE 1CC/31G 31G X 5/16" 1 ML Misc  Generic drug:  Insulin Syringe-Needle U-100  USE TO INJECT INSULIN TWICE A DAY AS INSTRUCTED     Calcium Carbonate-Vitamin D 600-400 MG-UNIT tablet  Commonly known as:  CALCIUM 600+D  Take 1 tablet by mouth 2 (two) times daily.     cetirizine 10 MG tablet  Commonly known as:  ZYRTEC  Take 10 mg by mouth as needed for allergies.     cholecalciferol 1000 units tablet  Commonly known as:  VITAMIN D  Take 2,000 Units by mouth daily.     empagliflozin 10 MG Tabs tablet  Commonly known as:  JARDIANCE  Take 10 mg by mouth daily.     hydroxypropyl methylcellulose / hypromellose 2.5 % ophthalmic solution  Commonly known as:  ISOPTO TEARS / GONIOVISC  Place 1 drop into  both eyes daily as needed for dry eyes. Reported on 01/13/2016     insulin glargine 100 UNIT/ML injection  Commonly known as:  LANTUS  Inject 0.38 mLs (38 Units total) into the skin 2 (two) times daily.     LIVALO 4 MG Tabs  Generic drug:  Pitavastatin Calcium  TAKE ONE TABLET BY MOUTH ONE TIME DAILY     losartan 25 MG tablet  Commonly known as:  COZAAR  TAKE ONE TABLET BY MOUTH ONE TIME DAILY     meclizine 12.5 MG tablet  Commonly known as:  ANTIVERT  Take 1 tablet (12.5 mg total) by mouth 3 (three) times daily as needed for dizziness.     metFORMIN 1000 MG tablet  Commonly known as:  GLUCOPHAGE  TAKE 1 TABLET EVERY MORNING AND 1/2 TABLET EVERY EVENING WITH FOOD     metoprolol 50 MG tablet  Commonly known as:  LOPRESSOR  Take 0.5  tablets (25 mg total) by mouth 2 (two) times daily.     multivitamin with minerals Tabs tablet  Take 1 tablet by mouth 2 (two) times daily.     sodium chloride 0.65 % Soln nasal spray  Commonly known as:  OCEAN  Place 1 spray into both nostrils as needed for congestion.     sulfamethoxazole-trimethoprim 800-160 MG tablet  Commonly known as:  BACTRIM DS,SEPTRA DS  Take 1 tablet by mouth 2 (two) times daily.           Objective:    BP 140/79 mmHg  Pulse 106  Temp(Src) 97.9 F (36.6 C) (Oral)  Ht 5' 7.5" (1.715 m)  Wt 206 lb 12.8 oz (93.804 kg)  BMI 31.89 kg/m2  Wt Readings from Last 3 Encounters:  01/18/16 206 lb 12.8 oz (93.804 kg)  01/13/16 209 lb (94.802 kg)  12/31/15 210 lb (95.255 kg)    Physical Exam  Constitutional: She is oriented to person, place, and time. She appears well-developed and well-nourished. No distress.  Eyes: Conjunctivae and EOM are normal. Pupils are equal, round, and reactive to light.  Cardiovascular: Normal rate, regular rhythm, normal heart sounds and intact distal pulses.   No murmur heard. Pulmonary/Chest: Effort normal and breath sounds normal. No respiratory distress. She has no wheezes.  Musculoskeletal: Normal range of motion. She exhibits tenderness (Slight amount of tenderness on exam over proximal first right phalanx or great toe. Range of motion and sensation and capillary refill are intact. No deformities or displacements noted on exam). She exhibits no edema.  Neurological: She is alert and oriented to person, place, and time. Coordination normal.  Skin: Skin is warm and dry. Lesion noted. No rash noted. She is not diaphoretic. There is erythema.     Psychiatric: She has a normal mood and affect. Her behavior is normal.  Nursing note and vitals reviewed.      Assessment & Plan:   Problem List Items Addressed This Visit    None    Visit Diagnoses    Cellulitis of right lower extremity    -  Primary    3 Nicholas I spots on  right upper anterior shin with surrounding erythema. We will send antibiotic    Relevant Medications    sulfamethoxazole-trimethoprim (BACTRIM DS,SEPTRA DS) 800-160 MG tablet        Follow up plan: Return if symptoms worsen or fail to improve.  Counseling provided for all of the vaccine components No orders of the defined types were placed in this encounter.  [name], MD [facility] 01/18/2016, 5:15 PM

## 2016-01-28 ENCOUNTER — Encounter: Payer: Self-pay | Admitting: Pharmacist

## 2016-01-28 ENCOUNTER — Ambulatory Visit (INDEPENDENT_AMBULATORY_CARE_PROVIDER_SITE_OTHER): Payer: Medicare Other | Admitting: Pharmacist

## 2016-01-28 VITALS — BP 126/64 | HR 72 | Ht 68.0 in | Wt 202.0 lb

## 2016-01-28 DIAGNOSIS — R110 Nausea: Secondary | ICD-10-CM

## 2016-01-28 DIAGNOSIS — E785 Hyperlipidemia, unspecified: Secondary | ICD-10-CM

## 2016-01-28 DIAGNOSIS — G629 Polyneuropathy, unspecified: Secondary | ICD-10-CM

## 2016-01-28 DIAGNOSIS — E669 Obesity, unspecified: Secondary | ICD-10-CM | POA: Diagnosis not present

## 2016-01-28 DIAGNOSIS — E1122 Type 2 diabetes mellitus with diabetic chronic kidney disease: Secondary | ICD-10-CM

## 2016-01-28 DIAGNOSIS — Z79899 Other long term (current) drug therapy: Secondary | ICD-10-CM

## 2016-01-28 MED ORDER — INSULIN PEN NEEDLE 32G X 4 MM MISC: Status: DC

## 2016-01-28 MED ORDER — INSULIN GLARGINE 300 UNIT/ML ~~LOC~~ SOPN: 40.0000 [IU] | PEN_INJECTOR | Freq: Two times a day (BID) | SUBCUTANEOUS | Status: DC

## 2016-01-28 NOTE — Patient Instructions (Signed)
Diabetes and Standards of Medical Care   Diabetes is complicated. You may find that your diabetes team includes a dietitian, nurse, diabetes educator, eye doctor, and more. To help everyone know what is going on and to help you get the care you deserve, the following schedule of care was developed to help keep you on track. Below are the tests, exams, vaccines, medicines, education, and plans you will need.  Blood Glucose Goals Prior to meals = 80 - 130 Within 2 hours of the start of a meal = less than 180  HbA1c test (goal is less than 7.0% - your last value was 11.2 %) This test shows how well you have controlled your glucose over the past 2 to 3 months. It is used to see if your diabetes management plan needs to be adjusted.   It is performed at least 2 times a year if you are meeting treatment goals.  It is performed 4 times a year if therapy has changed or if you are not meeting treatment goals.  Blood pressure test  This test is performed at every routine medical visit. The goal is less than 140/90 mmHg for most people, but 130/80 mmHg in some cases. Ask your health care provider about your goal.  Dental exam  Follow up with the dentist regularly.  Eye exam  If you are diagnosed with type 1 diabetes as a child, get an exam upon reaching the age of 52 years or older and have had diabetes for 3 to 5 years. Yearly eye exams are recommended after that initial eye exam.  If you are diagnosed with type 1 diabetes as an adult, get an exam within 5 years of diagnosis and then yearly.  If you are diagnosed with type 2 diabetes, get an exam as soon as possible after the diagnosis and then yearly.  Foot care exam  Visual foot exams are performed at every routine medical visit. The exams check for cuts, injuries, or other problems with the feet.  A comprehensive foot exam should be done yearly. This includes visual inspection as well as assessing foot pulses and testing for loss of  sensation.  Check your feet nightly for cuts, injuries, or other problems with your feet. Tell your health care provider if anything is not healing.  Kidney function test (urine microalbumin)  This test is performed once a year.  Type 1 diabetes: The first test is performed 5 years after diagnosis.  Type 2 diabetes: The first test is performed at the time of diagnosis.  A serum creatinine and estimated glomerular filtration rate (eGFR) test is done once a year to assess the level of chronic kidney disease (CKD), if present.  Lipid profile (cholesterol, HDL, LDL, triglycerides)  Performed every 5 years for most people.  The goal for LDL is less than 100 mg/dL. If you are at high risk, the goal is less than 70 mg/dL.  The goal for HDL is 40 mg/dL to 50 mg/dL for men and 50 mg/dL to 60 mg/dL for women. An HDL cholesterol of 60 mg/dL or higher gives some protection against heart disease.  The goal for triglycerides is less than 150 mg/dL.  Influenza vaccine, pneumococcal vaccine, and hepatitis B vaccine  The influenza vaccine is recommended yearly.  The pneumococcal vaccine is generally given once in a lifetime. However, there are some instances when another vaccination is recommended. Check with your health care provider.  The hepatitis B vaccine is also recommended for adults with diabetes.  Diabetes self-management education  Education is recommended at diagnosis and ongoing as needed.  Treatment plan  Your treatment plan is reviewed at every medical visit.  Document Released: 05/29/2009 Document Revised: 04/03/2013 Document Reviewed: 01/01/2013 [facility] Patient Information 2014 [place].   Fall Prevention in the Home  Falls can cause injuries and can affect people from all age groups. There are many simple things that you can do to make your home safe and to help prevent falls. WHAT CAN I DO ON THE OUTSIDE OF MY HOME?  Regularly repair the edges of walkways and  driveways and fix any cracks.  Remove high doorway thresholds.  Trim any shrubbery on the main path into your home.  Use bright outdoor lighting.  Clear walkways of debris and clutter, including tools and rocks.  Regularly check that handrails are securely fastened and in good repair. Both sides of any steps should have handrails.  Install guardrails along the edges of any raised decks or porches.  Have leaves, snow, and ice cleared regularly.  Use sand or salt on walkways during winter months.  In the garage, clean up any spills right away, including grease or oil spills. WHAT CAN I DO IN THE BATHROOM?  Use night lights.  Install grab bars by the toilet and in the tub and shower. Do not use towel bars as grab bars.  Use non-skid mats or decals on the floor of the tub or shower.  If you need to sit down while you are in the shower, use a plastic, non-slip stool.Marland Kitchen  Keep the floor dry. Immediately clean up any water that spills on the floor.  Remove soap buildup in the tub or shower on a regular basis.  Attach bath mats securely with double-sided non-slip rug tape.  Remove throw rugs and other tripping hazards from the floor. WHAT CAN I DO IN THE BEDROOM?  Use night lights.  Make sure that a bedside light is easy to reach.  Do not use oversized bedding that drapes onto the floor.  Have a firm chair that has side arms to use for getting dressed.  Remove throw rugs and other tripping hazards from the floor. WHAT CAN I DO IN THE KITCHEN?   Clean up any spills right away.  Avoid walking on wet floors.  Place frequently used items in easy-to-reach places.  If you need to reach for something above you, use a sturdy step stool that has a grab bar.  Keep electrical cables out of the way.  Do not use floor polish or wax that makes floors slippery. If you have to use wax, make sure that it is non-skid floor wax.  Remove throw rugs and other tripping hazards from the  floor. WHAT CAN I DO IN THE STAIRWAYS?  Do not leave any items on the stairs.  Make sure that there are handrails on both sides of the stairs. Fix handrails that are broken or loose. Make sure that handrails are as long as the stairways.  Check any carpeting to make sure that it is firmly attached to the stairs. Fix any carpet that is loose or worn.  Avoid having throw rugs at the top or bottom of stairways, or secure the rugs with carpet tape to prevent them from moving.  Make sure that you have a light switch at the top of the stairs and the bottom of the stairs. If you do not have them, have them installed. WHAT ARE SOME OTHER FALL PREVENTION TIPS?  Wear  closed-toe shoes that fit well and support your feet. Wear shoes that have rubber soles or low heels.  When you use a stepladder, make sure that it is completely opened and that the sides are firmly locked. Have someone hold the ladder while you are using it. Do not climb a closed stepladder.  Add color or contrast paint or tape to grab bars and handrails in your home. Place contrasting color strips on the first and last steps.  Use mobility aids as needed, such as canes, walkers, scooters, and crutches.  Turn on lights if it is dark. Replace any light bulbs that burn out.  Set up furniture so that there are clear paths. Keep the furniture in the same spot.  Fix any uneven floor surfaces.  Choose a carpet design that does not hide the edge of steps of a stairway.  Be aware of any and all pets.  Review your medicines with your healthcare provider. Some medicines can cause dizziness or changes in blood pressure, which increase your risk of falling. Talk with your health care provider about other ways that you can decrease your risk of falls. This may include working with a physical therapist or trainer to improve your strength, balance, and endurance.   This information is not intended to replace advice given to you by your health  care provider. Make sure you discuss any questions you have with your health care provider.   Document Released: 07/22/2002 Document Revised: 12/16/2014 Document Reviewed: 09/05/2014 Elsevier Interactive Patient Education [employer].

## 2016-01-28 NOTE — Progress Notes (Signed)
Patient ID: [name], female   DOB: December 09, 1950, 65 y.o.   MRN: [id number] Diabetes Follow-Up Visit Chief Complaint:   No chief complaint on file.    Filed Vitals:   01/28/16 0840  BP: 126/64  Pulse: 72   Filed Weights   01/28/16 0840  Weight: 202 lb (91.627 kg)   Body mass index is 30.72 kg/(m^2).   HPI: Patient with type 2 DM treated with insulin.  She had gastric sleeve surgery 01/20/14.  She has had no complications and per today's weight has lost about 58# total since surgery.  Currently she is taking Lantus 38 units BID, Jardiance 6m 1 tablet daily, metformin 1008m1 tablet bid    BG ranges from 138 to 286 (this has improved from range of 154 to 306 at last visit 1 month ago) 7 day avg = 201 30 day avg = 215   A1c was 12.9% (10/01/2105) A1c was 11.2% (12/31/2015)  Hyperlipidemia - taking  Livalo 41m76maily  Diet - since our last visit she has been more careful about her CHO serving sizes and choices.  She met with the dietician and she is making changes according to suggestions made at that visit.  Continues to see nutritionist every 3 months - last appt 01/04/2016  Low fat/carbohydrate diet?  Yes  Nicotine Abuse?  No Medication Compliance?  Yes Exercise?  No - due to vertigo (she is having w/u by neurologist currently) Alcohol Abuse?  No Patient c/o of having nausea x2 over the last 4 days.   Exam Edema:  negative Polyuria:  negative  Polydipsia:  negative Polyphagia:  negative  BMI:  Body mass index is 30.72 kg/(m^2).    Weight changes:  Decreased about 8# over last 30 days. General Appearance:  alert, oriented, no acute distress and obese Mood/Affect:  normal   Lab Results  Component Value Date   HGBA1c 11.2 12/31/2015   HGBA1C 12.9 10/02/2015    No results found for: [facility] Results  Component Value Date   CHOL 265* 05/06/2015   HDL 56 05/06/2015   LDLCALC 137* 05/06/2015   LDLDIRECT 113* 02/26/2013   TRIG 360* 05/06/2015   CHOLHDL  4.7* 05/06/2015      Assessment: 1.  Diabetes  - not adequately controlled but improving 2.  Blood Pressure.  Controlled 3.  Lipids - rechecked today 4.  Obesity - weight is starting to decrease again with dietary changes and more physical activity 5.  Nausea - possible related to eating too much, but will to amylase and lipase since DM and on Jardiance   Recommendations: 1.  Medication recommendations at this time are as follows:    Increased Lantus to 40 units bid  Continue  metformin 1000m83mtablet bid with food  Change Jardiance 10mg17mtake 1 tablet daily - gave #7 samples.  Checking BMET today -  if GFR is 45 or great might increase to 25mg 51my  (note form last visit - patient's new insurance formulary -   Soliqua (lantus + GLP-1) is not listed as covered or not covered, Trulicity (weekly) - tier 3 and Jardiance is tier 3)  Patient in Medicare Coverage gap - Gave #2 pens of Toujeo - instructed to take like Lantus 40 units bid - [facility] 2.  Reviewed HBG goals:  Fasting 80-130 and 1-2 hour post prandial <180.  Patient is instructed to check BG 3 times per  day.    3.  BP goal < 140/85. 4.  LDL goal of < 100, HDL > 40 and TG < 150. 5.  Eye Exam yearly and Dental Exam every 6 months. - eye exam UTD 6.  Dietary recommendations:  Continue with current changes.  Weight and BG are loving in right direction 7.  Physical Activity recommendations:  Continue walking daily as able 8. Return to clinic in 1 month to reaccess BG  Orders Placed This Encounter  Procedures  . Lipid panel  . CMP14+EGFR  . Lipase  . Amylase    Time spent counseling patient:  45 minutes  Referring provider:  Ermalinda Memos, PharmD, CPP, CDE

## 2016-01-29 ENCOUNTER — Telehealth: Payer: Self-pay | Admitting: Pharmacist

## 2016-01-29 LAB — CMP14+EGFR
ALT: 26 IU/L (ref 0–32)
AST: 23 IU/L (ref 0–40)
Albumin/Globulin Ratio: 1.5 (ref 1.2–2.2)
Albumin: 4.3 g/dL (ref 3.6–4.8)
Alkaline Phosphatase: 91 IU/L (ref 39–117)
BUN/Creatinine Ratio: 22 (ref 12–28)
BUN: 34 mg/dL — ABNORMAL HIGH (ref 8–27)
Bilirubin Total: 0.5 mg/dL (ref 0.0–1.2)
CO2: 20 mmol/L (ref 18–29)
Calcium: 9.7 mg/dL (ref 8.7–10.3)
Chloride: 101 mmol/L (ref 96–106)
Creatinine, Ser: 1.56 mg/dL — ABNORMAL HIGH (ref 0.57–1.00)
GFR calc Af Amer: 40 mL/min/{1.73_m2} — ABNORMAL LOW (ref >59)
GFR calc non Af Amer: 35 mL/min/{1.73_m2} — ABNORMAL LOW (ref >59)
Globulin, Total: 2.8 g/dL (ref 1.5–4.5)
Glucose: 208 mg/dL — ABNORMAL HIGH (ref 65–99)
Potassium: 5.1 mmol/L (ref 3.5–5.2)
Sodium: 140 mmol/L (ref 134–144)
Total Protein: 7.1 g/dL (ref 6.0–8.5)

## 2016-01-29 LAB — LIPID PANEL
Chol/HDL Ratio: 5.2 ratio units — ABNORMAL HIGH (ref 0.0–4.4)
Cholesterol, Total: 248 mg/dL — ABNORMAL HIGH (ref 100–199)
HDL: 48 mg/dL (ref >39)
Triglycerides: 462 mg/dL — ABNORMAL HIGH (ref 0–149)

## 2016-01-29 LAB — AMYLASE: Amylase: 70 U/L (ref 31–124)

## 2016-01-29 LAB — LIPASE: Lipase: 186 U/L — ABNORMAL HIGH (ref 0–59)

## 2016-01-29 NOTE — Telephone Encounter (Signed)
Triglycerides elevated - most likely related to uncontrolled DM.  Serum creatinine has increased since starting Jardiance 10mg .  Amylase is WNL but lipase if elevated.  Discussed with Dr Laurance Flatten - decided to hold Jardiance for the next 2-3 days.  RTC in 2-3 days and have lipase and amylase rechecked.  Patient to call office or go to ER if experienced abdominal pain or severe nausea / vomiting Left message for patient on VM.

## 2016-02-01 ENCOUNTER — Other Ambulatory Visit: Payer: Self-pay | Admitting: Pharmacist

## 2016-02-01 ENCOUNTER — Other Ambulatory Visit: Payer: Medicare Other

## 2016-02-01 DIAGNOSIS — R748 Abnormal levels of other serum enzymes: Secondary | ICD-10-CM

## 2016-02-01 MED ORDER — PITAVASTATIN CALCIUM 4 MG PO TABS: 1.0000 | ORAL_TABLET | Freq: Every day | ORAL | Status: DC

## 2016-02-01 MED ORDER — INSULIN GLARGINE 100 UNIT/ML ~~LOC~~ SOLN: 40.0000 [IU] | Freq: Two times a day (BID) | SUBCUTANEOUS | Status: DC

## 2016-02-01 NOTE — Telephone Encounter (Signed)
Spoke with patient from home 01/29/16.  She was made aware of results and treatment plan.

## 2016-02-02 LAB — AMYLASE: Amylase: 52 U/L (ref 31–124)

## 2016-02-02 LAB — LIPASE: Lipase: 150 U/L — ABNORMAL HIGH (ref 0–59)

## 2016-02-08 ENCOUNTER — Telehealth: Payer: Self-pay | Admitting: Family Medicine

## 2016-02-08 MED ORDER — BASAGLAR KWIKPEN 100 UNIT/ML ~~LOC~~ SOPN: 40.0000 [IU] | PEN_INJECTOR | Freq: Two times a day (BID) | SUBCUTANEOUS | Status: DC

## 2016-02-08 NOTE — Telephone Encounter (Signed)
Patient is in medicare coverage gap.  We are waiting to see if she is approved for PAP.   Given #2 sample pens of Basaglar - insulin glargine.  Instrutcted to inject 40 units bid same as Toujeo or Lantus.  She is to use in place of either of these long acting insulins.  Patient voices understanding over phone.

## 2016-02-29 ENCOUNTER — Ambulatory Visit (INDEPENDENT_AMBULATORY_CARE_PROVIDER_SITE_OTHER): Payer: Medicare Other | Admitting: Family Medicine

## 2016-02-29 ENCOUNTER — Telehealth: Payer: Self-pay | Admitting: *Deleted

## 2016-02-29 ENCOUNTER — Ambulatory Visit (INDEPENDENT_AMBULATORY_CARE_PROVIDER_SITE_OTHER): Payer: Medicare Other

## 2016-02-29 ENCOUNTER — Encounter: Payer: Self-pay | Admitting: Family Medicine

## 2016-02-29 VITALS — BP 148/74 | HR 86 | Temp 97.0°F | Ht 68.0 in | Wt 208.0 lb

## 2016-02-29 DIAGNOSIS — M79674 Pain in right toe(s): Secondary | ICD-10-CM | POA: Diagnosis not present

## 2016-02-29 DIAGNOSIS — E785 Hyperlipidemia, unspecified: Secondary | ICD-10-CM

## 2016-02-29 DIAGNOSIS — R0989 Other specified symptoms and signs involving the circulatory and respiratory systems: Secondary | ICD-10-CM | POA: Diagnosis not present

## 2016-02-29 DIAGNOSIS — I739 Peripheral vascular disease, unspecified: Secondary | ICD-10-CM | POA: Diagnosis not present

## 2016-02-29 DIAGNOSIS — E559 Vitamin D deficiency, unspecified: Secondary | ICD-10-CM | POA: Diagnosis not present

## 2016-02-29 DIAGNOSIS — Z903 Acquired absence of stomach [part of]: Secondary | ICD-10-CM | POA: Diagnosis not present

## 2016-02-29 DIAGNOSIS — E1122 Type 2 diabetes mellitus with diabetic chronic kidney disease: Secondary | ICD-10-CM

## 2016-02-29 DIAGNOSIS — N183 Chronic kidney disease, stage 3 unspecified: Secondary | ICD-10-CM

## 2016-02-29 DIAGNOSIS — I10 Essential (primary) hypertension: Secondary | ICD-10-CM

## 2016-02-29 DIAGNOSIS — G629 Polyneuropathy, unspecified: Secondary | ICD-10-CM | POA: Diagnosis not present

## 2016-02-29 DIAGNOSIS — M549 Dorsalgia, unspecified: Secondary | ICD-10-CM | POA: Diagnosis not present

## 2016-02-29 NOTE — Progress Notes (Signed)
Subjective:    Patient ID: [name], female    DOB: Mar 21, 1951, 65 y.o.   MRN: [id number]  HPI Pt here for follow up and management of chronic medical problems which includes diabetes, hypertension and hyperlipidemia. She is taking medications regularly.The patient today complains of some back pain in the mid back area and some right great toe pain. In reviewing her health maintenance she is due to get a mammogram. She is also due to get a chest x-ray. She is questioning whether she should be taking an adult aspirin or baby aspirin. The patient had a spell the end of June and she was with her daughter and had trouble expressing herself. This lasted for about 30 minutes. She does not have an appointment with a neurologist until the end of this year. We will have her increase her baby aspirin to a 325 coated aspirin and tried to get her into see the neurologist sooner. She denies any chest pain pressure or chest tightness. She has not had any shortness of breath. She has no trouble with her GI tract as far as nausea vomiting. She does have alternating constipation and loose bowel movements but has not seen any blood in the stool. She is no longer having any abdominal pain like she was having several weeks ago. She is passing her water without problems. She is being followed by the clinical pharmacists who is helping her with her diabetes and has questions about the expense of Lantus and we will have the clinical pharmacist get back in touch with her to work with her on lowering the cause for this medicine. She is also now taking metformin 1000 mg twice daily and she will need a new prescription for this. He continues to see the cardiologist and the neurologist.    Patient Active Problem List   Diagnosis Date Noted  . Memory difficulty 11/26/2015  . Screening for colon cancer 07/14/2015  . Benign neoplasm of sigmoid colon 07/14/2015  . Low serum vitamin D 05/06/2015  . Vertigo 05/06/2015  .  Aphasia 12/08/2014  . TIA (transient ischemic attack) 11/07/2014  . 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 CKD stage 3 due to type 2 diabetes mellitus ([place]) 06/03/2014  . Type II diabetes mellitus with renal manifestations ([place]) 06/03/2014  . Type II diabetes mellitus with peripheral circulatory disorder ([place]) 06/03/2014  . Obesity (BMI 30.0-34.9) 04/24/2014  . Dizziness and giddiness 04/14/2014  . 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 Osteopenia 10/09/2013  . Nonhealing nonsurgical wound 09/17/2013  . Abnormal ankle brachial index 09/11/2013  . Hypomagnesemia 08/01/2013  . DYSPNEA 08/18/2010  . MYOCARDIAL PERFUSION SCAN, WITH STRESS TEST, ABNORMAL 08/04/2010  . ELECTROCARDIOGRAM, ABNORMAL 07/28/2010  . Edema extremities 12/30/2008  . Hyperlipidemia 12/26/2008  . Benign essential HTN 12/26/2008  . CAD 12/26/2008  . PERICARDIAL EFFUSION 12/26/2008  . GERD 12/26/2008  . OSTEOARTHRITIS 12/26/2008   Outpatient Encounter Prescriptions as of 02/29/2016  Medication Sig  . ACCU-CHEK AVIVA PLUS test strip CHECK BLOOD SUGAR UP TO TWICE DAILY OR AS INSTRUCTED  . aspirin EC 325 MG tablet Take 1 tablet (325 mg total) by mouth daily.  . B Complex Vitamins (B-COMPLEX/B-12 SL) Place 500 mcg under the tongue every other day.   . B-D INS SYR ULTRAFINE 1CC/31G 31G X 5/16" 1 ML MISC USE TO INJECT INSULIN TWICE A DAY AS INSTRUCTED  . Calcium Carbonate-Vitamin D (CALCIUM 600+D) 600-400 MG-UNIT per tablet Take 1 tablet by mouth 2 (two) times  daily.  . cetirizine (ZYRTEC) 10 MG tablet Take 10 mg by mouth as needed for allergies.  . cholecalciferol (VITAMIN D) 1000 UNITS tablet Take 2,000 Units by mouth daily.  . hydroxypropyl methylcellulose / hypromellose (ISOPTO TEARS / GONIOVISC) 2.5 % ophthalmic solution Place 1 drop into both eyes daily as needed for dry eyes. Reported on 01/28/2016  . Insulin Glargine (BASAGLAR KWIKPEN) 100 UNIT/ML SOPN Inject 0.4 mLs (40 Units total) into the skin 2  (two) times daily.  . insulin glargine (LANTUS) 100 UNIT/ML injection Inject 0.4 mLs (40 Units total) into the skin 2 (two) times daily.  . Insulin Glargine (TOUJEO SOLOSTAR) 300 UNIT/ML SOPN Inject 40 Units into the skin 2 (two) times daily.  . Insulin Pen Needle 32G X 4 MM MISC Use to inject insulin with insulin pen  . losartan (COZAAR) 25 MG tablet TAKE ONE TABLET BY MOUTH ONE TIME DAILY  . meclizine (ANTIVERT) 12.5 MG tablet Take 1 tablet (12.5 mg total) by mouth 3 (three) times daily as needed for dizziness.  . metFORMIN (GLUCOPHAGE) 1000 MG tablet TAKE 1 TABLET EVERY MORNING AND 1/2 TABLET EVERY EVENING WITH FOOD  . metoprolol (LOPRESSOR) 50 MG tablet Take 0.5 tablets (25 mg total) by mouth 2 (two) times daily.  . Multiple Vitamin (MULTIVITAMIN WITH MINERALS) TABS tablet Take 1 tablet by mouth 2 (two) times daily.  . Pitavastatin Calcium (LIVALO) 4 MG TABS Take 1 tablet (4 mg total) by mouth daily.  . sodium chloride (OCEAN) 0.65 % SOLN nasal spray Place 1 spray into both nostrils as needed for congestion.  . [DISCONTINUED] empagliflozin (JARDIANCE) 10 MG TABS tablet Take 10 mg by mouth daily.  . [DISCONTINUED] sulfamethoxazole-trimethoprim (BACTRIM DS,SEPTRA DS) 800-160 MG tablet Take 1 tablet by mouth 2 (two) times daily.   No facility-administered encounter medications on file as of 02/29/2016.      Review of Systems  Constitutional: Negative.   HENT: Negative.   Eyes: Negative.   Respiratory: Negative.   Cardiovascular: Negative.   Gastrointestinal: Negative.   Endocrine: Negative.   Genitourinary: Negative.   Musculoskeletal: Positive for back pain (mid back pain) and arthralgias (right great toe).  Skin: Negative.   Allergic/Immunologic: Negative.   Neurological: Negative.   Hematological: Negative.   Psychiatric/Behavioral: Negative.        Objective:   Physical Exam  Constitutional: She is oriented to person, place, and time. She appears well-developed and  well-nourished. No distress.  HENT:  Head: Normocephalic and atraumatic.  Right Ear: External ear normal.  Left Ear: External ear normal.  Nose: Nose normal.  Mouth/Throat: Oropharynx is clear and moist.  Eyes: Conjunctivae and EOM are normal. Pupils are equal, round, and reactive to light. Right eye exhibits no discharge. Left eye exhibits no discharge. No scleral icterus.  Neck: Normal range of motion. Neck supple. No thyromegaly present.  Cardiovascular: Normal rate and regular rhythm.  Exam reveals no gallop and no friction rub.   Murmur heard. The heart is 84/m with a regular rate and rhythm with a grade 2/6 systolic ejection murmur. The patient has decreased pedal pulses in the left foot. It is been a while since she has had arterial Dopplers.  Pulmonary/Chest: Effort normal and breath sounds normal. No respiratory distress. She has no wheezes. She has no rales. She exhibits no tenderness.  Clear anteriorly and posteriorly  Abdominal: Soft. Bowel sounds are normal. She exhibits no mass. There is no tenderness. There is no rebound and no guarding.  The patient has  a protuberant abdomen with no masses or organ enlargement palpable.  Musculoskeletal: Normal range of motion. She exhibits no edema or tenderness.  She does have some right great toe pain and back pain with movement.  Lymphadenopathy:    She has no cervical adenopathy.  Neurological: She is alert and oriented to person, place, and time. She has normal reflexes. No cranial nerve deficit.  Skin: Skin is warm and dry. No rash noted.  Psychiatric: She has a normal mood and affect. Her behavior is normal. Judgment and thought content normal.  Nursing note and vitals reviewed.  BP 148/74 mmHg  Pulse 86  Temp(Src) 97 F (36.1 C) (Oral)  Ht 5\' 8"  (1.727 m)  Wt 208 lb (94.348 kg)  BMI 31.63 kg/m2  [facility] reading (PRIMARY) by  Dr. Glendon Axe spine films and right great toe films pending                                        Assessment & Plan:  1. Hyperlipidemia -Continue current treatment pending results of lab work that will be done in the future. - DG Chest 2 View; Future  2. Type 2 diabetes mellitus with diabetic chronic kidney disease, unspecified CKD stage, unspecified long term insulin use status ([place]) -Continue follow-up with clinical pharmacy and continue with current treatment and aggressive therapeutic lifestyle changes  3. Vitamin D deficiency -Continue current treatment  4. Benign essential HTN -Continue sodium restriction and current treatment - DG Chest 2 View; Future  5. CKD stage 3 due to type 2 diabetes mellitus ([facility]) -Avoid NSAIDs and maintain good blood pressure control. Bring blood pressure readings by for review in 4 weeks  6. 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 Great toe pain, right - DG Toe Great Right; Future  8. Mid back pain - DG Thoracic Spine 2 View; Future  9. Peripheral artery disease ([place]) -Arrange for arterial Dopplers of left lower extremity  10. Neuropathy ([place]) -This may be diabetic related and could be coming from her back. -She must continue with aggressive therapeutic lifestyle changes  Patient Instructions                       Medicare Annual Wellness Visit  [facility] and the medical providers at [facility] strive to bring you the best medical care.  In doing so we not only want to address your current medical conditions and concerns but also to detect new conditions early and prevent illness, disease and health-related problems.    Medicare offers a yearly Wellness Visit which allows our clinical staff to assess your need for preventative services including immunizations, lifestyle education, counseling to decrease risk of preventable diseases and screening for fall risk and other medical concerns.    This visit is provided free of charge (no copay) for all Medicare recipients. The clinical pharmacists at [facility] have begun to conduct these Wellness Visits which will also include a thorough review of all your medications.    As you primary medical provider recommend that you make an appointment for your Annual Wellness Visit if you have not done so already this year.  You may set up this appointment before you leave today or you may call back [phone]) and schedule an appointment.  Please make sure when you call that you mention that you are scheduling your Annual  Wellness Visit with the clinical pharmacist so that the appointment may be made for the proper length of time.      Continue current medications. Continue good therapeutic lifestyle changes which include good diet and exercise. Fall precautions discussed with patient. If an FOBT was given today- please return it to our front desk. If you are over 54 years old - you may need Prevnar 35 or the adult Pneumonia vaccine.  **Flu shots are available--- please call and schedule a FLU-CLINIC appointment**  After your visit with [place] today you will receive a survey in the mail or online from [employer] regarding your care with [place]. Please take a moment to fill this out. Your feedback is very important to [place] as you can help [place] better understand your patient needs as well as improve your experience and satisfaction. WE CARE ABOUT YOU!!!   We will have the clinical pharmacist to call you regarding the expense of the Lantus insulin We will arrange an earlier visit with the neurologist because of the recent episode of dysphasia that lasted 30 minutes In the meantime he should increase her aspirin to 325 mg coated We will also schedule arterial Dopplers of the left lower extremity because of diminished pulses     Arrie Senate MD

## 2016-02-29 NOTE — Addendum Note (Signed)
Addended by: Zannie Cove on: 02/29/2016 12:11 PM   Modules accepted: Orders

## 2016-02-29 NOTE — Telephone Encounter (Signed)
[name], the patient would like a call from you to discuss Lantus cost and other options.  I think she said it was about $200 / month.

## 2016-02-29 NOTE — Telephone Encounter (Signed)
We have sent referral to [facility] Patient Assistance program - 02/04/2016 Called Denyse Amass to see if referral was received  - left message.

## 2016-02-29 NOTE — Patient Instructions (Addendum)
Medicare Annual Wellness Visit  [facility] and the medical providers at [facility] strive to bring you the best medical care.  In doing so we not only want to address your current medical conditions and concerns but also to detect new conditions early and prevent illness, disease and health-related problems.    Medicare offers a yearly Wellness Visit which allows our clinical staff to assess your need for preventative services including immunizations, lifestyle education, counseling to decrease risk of preventable diseases and screening for fall risk and other medical concerns.    This visit is provided free of charge (no copay) for all Medicare recipients. The clinical pharmacists at [facility] have begun to conduct these Wellness Visits which will also include a thorough review of all your medications.    As you primary medical provider recommend that you make an appointment for your Annual Wellness Visit if you have not done so already this year.  You may set up this appointment before you leave today or you may call back [phone] and schedule an appointment.  Please make sure when you call that you mention that you are scheduling your Annual Wellness Visit with the clinical pharmacist so that the appointment may be made for the proper length of time.      Continue current medications. Continue good therapeutic lifestyle changes which include good diet and exercise. Fall precautions discussed with patient. If an FOBT was given today- please return it to our front desk. If you are over 28 years old - you may need Prevnar 42 or the adult Pneumonia vaccine.  **Flu shots are available--- please call and schedule a FLU-CLINIC appointment**  After your visit with [place] today you will receive a survey in the mail or online from [employer] regarding your care with [place]. Please take a moment to fill this out. Your feedback is very  important to [place] as you can help [place] better understand your patient needs as well as improve your experience and satisfaction. WE CARE ABOUT YOU!!!   We will have the clinical pharmacist to call you regarding the expense of the Lantus insulin Please arrange an earlier visit with the neurologist because of the recent episode of dysphasia that lasted 30 minutes -- let them know this happened. In the meantime he should increase her aspirin to 325 mg coated We will also schedule arterial Dopplers of the left lower extremity because of diminished pulses

## 2016-03-07 ENCOUNTER — Telehealth: Payer: Self-pay | Admitting: Neurology

## 2016-03-07 NOTE — Telephone Encounter (Signed)
error 

## 2016-03-10 ENCOUNTER — Encounter: Payer: Self-pay | Admitting: Neurology

## 2016-03-10 ENCOUNTER — Ambulatory Visit (INDEPENDENT_AMBULATORY_CARE_PROVIDER_SITE_OTHER): Payer: Medicare Other | Admitting: Neurology

## 2016-03-10 VITALS — BP 130/72 | HR 84 | Ht 68.0 in | Wt 207.0 lb

## 2016-03-10 DIAGNOSIS — G459 Transient cerebral ischemic attack, unspecified: Secondary | ICD-10-CM | POA: Diagnosis not present

## 2016-03-10 DIAGNOSIS — R413 Other amnesia: Secondary | ICD-10-CM | POA: Diagnosis not present

## 2016-03-10 NOTE — Patient Instructions (Addendum)
Stroke Prevention Some medical conditions and behaviors are associated with an increased chance of having a stroke. You may prevent a stroke by making healthy choices and managing medical conditions. HOW CAN I REDUCE MY RISK OF HAVING A STROKE?   Stay physically active. Get at least 30 minutes of activity on most or all days.  Do not smoke. It may also be helpful to avoid exposure to secondhand smoke.  Limit alcohol use. Moderate alcohol use is considered to be:  No more than 2 drinks per day for men.  No more than 1 drink per day for nonpregnant women.  Eat healthy foods. This involves:  Eating 5 or more servings of fruits and vegetables a day.  Making dietary changes that address high blood pressure (hypertension), high cholesterol, diabetes, or obesity.  Manage your cholesterol levels.  Making food choices that are high in fiber and low in saturated fat, trans fat, and cholesterol may control cholesterol levels.  Take any prescribed medicines to control cholesterol as directed by your health care provider.  Manage your diabetes.  Controlling your carbohydrate and sugar intake is recommended to manage diabetes.  Take any prescribed medicines to control diabetes as directed by your health care provider.  Control your hypertension.  Making food choices that are low in salt (sodium), saturated fat, trans fat, and cholesterol is recommended to manage hypertension.  Ask your health care provider if you need treatment to lower your blood pressure. Take any prescribed medicines to control hypertension as directed by your health care provider.  If you are 18-39 years of age, have your blood pressure checked every 3-5 years. If you are 40 years of age or older, have your blood pressure checked every year.  Maintain a healthy weight.  Reducing calorie intake and making food choices that are low in sodium, saturated fat, trans fat, and cholesterol are recommended to manage  weight.  Stop drug abuse.  Avoid taking birth control pills.  Talk to your health care provider about the risks of taking birth control pills if you are over 35 years old, smoke, get migraines, or have ever had a blood clot.  Get evaluated for sleep disorders (sleep apnea).  Talk to your health care provider about getting a sleep evaluation if you snore a lot or have excessive sleepiness.  Take medicines only as directed by your health care provider.  For some people, aspirin or blood thinners (anticoagulants) are helpful in reducing the risk of forming abnormal blood clots that can lead to stroke. If you have the irregular heart rhythm of atrial fibrillation, you should be on a blood thinner unless there is a good reason you cannot take them.  Understand all your medicine instructions.  Make sure that other conditions (such as anemia or atherosclerosis) are addressed. SEEK IMMEDIATE MEDICAL CARE IF:   You have sudden weakness or numbness of the face, arm, or leg, especially on one side of the body.  Your face or eyelid droops to one side.  You have sudden confusion.  You have trouble speaking (aphasia) or understanding.  You have sudden trouble seeing in one or both eyes.  You have sudden trouble walking.  You have dizziness.  You have a loss of balance or coordination.  You have a sudden, severe headache with no known cause.  You have new chest pain or an irregular heartbeat. Any of these symptoms may represent a serious problem that is an emergency. Do not wait to see if the symptoms will   go away. Get medical help at once. Call your local emergency services (911 in [place] Do not drive yourself to the hospital.   This information is not intended to replace advice given to you by your health care provider. Make sure you discuss any questions you have with your health care provider.   Document Released: 09/08/2004 Document Revised: 08/22/2014 Document Reviewed:  02/01/2013 Elsevier Interactive Patient Education 2016 [employer]  

## 2016-03-10 NOTE — Progress Notes (Signed)
Reason for visit: TIA event  [name] is an 65 y.o. female  History of present illness:  [name] is a 65 year old right-handed white female with a history of cerebrovascular disease. 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 This is felt to be the source of a similar event in 2016. The patient has been on low-dose aspirin, but in late June 2017 she was riding with her daughter in a car, and she had a 30 minute episode of speech disturbance consistent with aphasia. The patient had no other associated symptoms of numbness, weakness, facial droop, or confusion. The patient had a slight headache over the left brow following the event. The patient has not had any further recurrence of the symptoms. The patient has been increased on aspirin dose to 325 mg daily. She is sent to this office for an evaluation.   Past Medical History:  Diagnosis Date  . Abnormal Doppler ultrasound of carotid artery    a. 05/2013 - mild plaque, 1-39% BICA, abnormal appearrance of thyroid (TSH wnl in 06/2013).  . Allergy    seasonal  . CAD (coronary artery disease)    a. Nonobstructive by cath 2006 - 25% LAD, 25-30% after 1st diag, distal 25% PDA, minor irreg LCx. b. Normal nuc 2011.  . Cataract    surgery  . Chronic kidney disease    stage 3  . Cystocele   . Difficult intubation 09-07-2012   big neck trouble with intubation parotid surgery"takes little med to sedate"  . Dizziness and giddiness 04/14/2014  . Dyslipidemia   . Esophageal stricture   . Family history of anesthesia complication    daughter slow to awaken after teeth extraction at age 64  . Fatty liver   . Fracture, skull ([place])   . GERD (gastroesophageal reflux disease)   . Heart murmur   . Hiatal hernia    a. 07/2013: HH with small stricture holding up barium tablet (concides with patient's sx of food sticking).  . Hyperlipidemia   . Hypertension    dr Percival Spanish  . IBS (irritable bowel  syndrome)   . 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 Memory difficulty 11/26/2015  . Morbid obesity ([place])   . Obesity   . Osteoarthritis   . Osteopenia   . Osteoporosis   . Palpitations    a. PVC/PAC on EKG 08/2013.  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  Marland Kitchen Pericardial effusion    a. Mod by echo 2012 at time of PNA. b. Echo 07/2012: small pericardial effusion vs fat.  Marland Kitchen PONV (postoperative nausea and vomiting)    n/v, and room spinning after cataract and parotid surgery  . Sleep apnea    CPAP currently broke, calling company about machine, pt does not know cpap settings, uses cpap off and on  . Stroke [facility]) 11-07-2014   tia  . Superficial phlebitis   . TIA (transient ischemic attack) 2016  . Toe ulcer ([place]) 01-08-14   Left big toe-being tx. at wound center "is healing"  . Varicose veins   . Vitamin D deficiency     Past Surgical History:  Procedure Laterality Date  . BREATH TEK H PYLORI N/A 07/29/2013   Procedure: BREATH TEK H PYLORI;  Surgeon: Shann Medal, MD;  Location: [facility];  Service: General;  Laterality: N/A;  . CARDIAC CATHETERIZATION  06  . CARPAL TUNNEL RELEASE  5 yrs ago   Left  . CATARACT EXTRACTION Bilateral yrs ago  . CHOLECYSTECTOMY  1982  . COLONOSCOPY N/A 07/14/2015   Procedure: COLONOSCOPY;  Surgeon: Ladene Artist, MD;  Location: [facility];  Service: Endoscopy;  Laterality: N/A;  . ESOPHAGOGASTRODUODENOSCOPY N/A 12/27/2013   Procedure: ESOPHAGOGASTRODUODENOSCOPY (EGD);  Surgeon: Shann Medal, MD;  Location: [facility];  Service: General;  Laterality: N/A;  . EYE SURGERY    . LAPAROSCOPIC GASTRIC SLEEVE RESECTION N/A 01/20/2014   Procedure: LAPAROSCOPIC GASTRIC SLEEVE RESECTION;  Surgeon: Shann Medal, MD;  Location: [facility];  Service: General;  Laterality: N/A;  . PAROTIDECTOMY  09/07/2012   Procedure: PAROTIDECTOMY;  Surgeon: Melida Quitter, MD;  Location: [facility];  Service: ENT;  Laterality:  Left;  LEFT PAROTIDECTOMY  . TONSILLECTOMY  1968  . TUBAL LIGATION  yrs ago  . UPPER GI ENDOSCOPY  01/20/2014   Procedure: UPPER GI ENDOSCOPY;  Surgeon: Shann Medal, MD;  Location: [facility];  Service: General;;    Family History  Problem Relation Age of Onset  . Heart failure Mother   . Hypertension Mother   . Skin cancer Mother   . Colitis Mother   . Diabetes Mother   . Kidney Stones Mother   . Heart disease Father   . Colon polyps Father   . Diabetes Father   . Heart attack Sister   . Diabetes Other   . Diabetes Daughter   . Colon cancer Neg Hx     Social history:  reports that she has never smoked. She has never used smokeless tobacco. She reports that she drinks alcohol. She reports that she does not use drugs.    Allergies  Allergen Reactions  . Atorvastatin     Myalgias   . Crestor [Rosuvastatin Calcium] Other (See Comments)    Stiffness and back pain  . Ezetimibe-Simvastatin Other (See Comments)    Leg cramps  . Celebrex [Celecoxib] Rash  . Levemir [Insulin Detemir] Rash  . Penicillins Other (See Comments)    SYNCOPE (Tolerated Ancef) Has patient had a PCN reaction causing immediate rash, facial/tongue/throat swelling, SOB or lightheadedness with hypotension: Yes Has patient had a PCN reaction causing severe rash involving mucus membranes or skin necrosis: No Has patient had a PCN reaction that required hospitalization No Has patient had a PCN reaction occurring within the last 10 years: No If all of the above answers are "NO", then may proceed with Cephalosporin use.    Medications:  Prior to Admission medications   Medication Sig Start Date End Date Taking? Authorizing Provider  ACCU-CHEK AVIVA PLUS test strip CHECK BLOOD SUGAR UP TO TWICE DAILY OR AS INSTRUCTED 02/23/15  Yes Chipper Herb, MD  aspirin EC 325 MG tablet Take 1 tablet (325 mg total) by mouth daily. 07/19/15  Yes Ladene Artist, MD  B Complex Vitamins (B-COMPLEX/B-12 SL) Place 500 mcg under  the tongue every other day.    Yes Historical Provider, MD  B-D INS SYR ULTRAFINE 1CC/31G 31G X 5/16" 1 ML MISC USE TO INJECT INSULIN TWICE A DAY AS INSTRUCTED 11/16/15  Yes Chipper Herb, MD  Calcium Carbonate-Vitamin D (CALCIUM 600+D) 600-400 MG-UNIT per tablet Take 1 tablet by mouth 2 (two) times daily. 05/06/15  Yes Cherre Robins, PharmD  cetirizine (ZYRTEC) 10 MG tablet Take 10 mg by mouth as needed for allergies.   Yes Historical Provider, MD  cholecalciferol (VITAMIN D) 1000 UNITS tablet Take  2,000 Units by mouth daily.   Yes Historical Provider, MD  hydroxypropyl methylcellulose / hypromellose (ISOPTO TEARS / GONIOVISC) 2.5 % ophthalmic solution Place 1 drop into both eyes daily as needed for dry eyes. Reported on 01/28/2016   Yes Historical Provider, MD  insulin glargine (LANTUS) 100 UNIT/ML injection Inject 0.4 mLs (40 Units total) into the skin 2 (two) times daily. 02/01/16  Yes Chipper Herb, MD  Insulin Glargine (TOUJEO SOLOSTAR) 300 UNIT/ML SOPN Inject 40 Units into the skin 2 (two) times daily. 01/28/16  Yes Cherre Robins, PharmD  Insulin Pen Needle 32G X 4 MM MISC Use to inject insulin with insulin pen 01/28/16  Yes Cherre Robins, PharmD  losartan (COZAAR) 25 MG tablet TAKE ONE TABLET BY MOUTH ONE TIME DAILY 11/26/15  Yes Chipper Herb, MD  meclizine (ANTIVERT) 12.5 MG tablet Take 1 tablet (12.5 mg total) by mouth 3 (three) times daily as needed for dizziness. 01/30/15  Yes Chipper Herb, MD  metFORMIN (GLUCOPHAGE) 1000 MG tablet TAKE 1 TABLET EVERY MORNING AND 1/2 TABLET EVERY EVENING WITH FOOD 10/27/15  Yes Chipper Herb, MD  metoprolol (LOPRESSOR) 50 MG tablet Take 0.5 tablets (25 mg total) by mouth 2 (two) times daily. 10/27/15  Yes Chipper Herb, MD  Multiple Vitamin (MULTIVITAMIN WITH MINERALS) TABS tablet Take 1 tablet by mouth 2 (two) times daily.   Yes Historical Provider, MD  Pitavastatin Calcium (LIVALO) 4 MG TABS Take 1 tablet (4 mg total) by mouth daily. 02/01/16  Yes Chipper Herb, MD  sodium chloride (OCEAN) 0.65 % SOLN nasal spray Place 1 spray into both nostrils as needed for congestion. 11/09/14  Yes Domenic Polite, MD    ROS:  Out of a complete 14 system review of symptoms, the patient complains only of the following symptoms, and all other reviewed systems are negative.  Hearing loss, ringing in the ears, runny nose Eye pain Abdominal pain, diarrhea, nausea Memory loss, speech difficulty  Blood pressure 130/72, pulse 84, height 5\' 8"  (1.727 m), weight 207 lb (93.9 kg).  Physical Exam  General: The patient is alert and cooperative at the time of the examination. The patient is markedly obese.  Skin: No significant peripheral edema is noted.   Neurologic Exam  Mental status: The patient is alert and oriented x 3 at the time of the examination. The patient has apparent normal recent and remote memory, with an apparently normal attention span and concentration ability. Mini-Mental Status Examination done today shows total score of 30/30.   Cranial nerves: Facial symmetry is present. Speech is normal, no aphasia or dysarthria is noted. Extraocular movements are full. Visual fields are full.  Motor: The patient has good strength in all 4 extremities.  Sensory examination: Soft touch sensation is symmetric on the face, arms, and legs.  Coordination: The patient has good finger-nose-finger and heel-to-shin bilaterally.  Gait and station: The patient has a normal gait. Tandem gait is slightly unsteady. Romberg is negative. No drift is seen.  Reflexes: Deep tendon reflexes are symmetric.   Assessment/Plan:  1. TIA, transient aphasia  2. Left posterior cerebral artery P2 segment stenosis  3. Mild memory disturbance  The patient has had a recent TIA event. The patient is to continue the 325 mg aspirin tablet. We will recheck a carotid Doppler study, but I suspect that the etiology of her symptoms is secondary to intracranial disease. Management  of all risk factors for stroke is important including following blood pressure, cholesterol, increasing  exercise, proper diet. I discussed this with patient. The patient is to stay well hydrated, particularly in the hot summer months. She will follow-up for next scheduled appointment. The patient has not noted any significant change in memory. She reports no long-lasting deficits following the most recent TIA event.  Jill Alexanders MD 03/10/2016 4:27 PM  [facility] [address] [place], [place] [address]  Phone [phone] Fax [phone]

## 2016-03-11 ENCOUNTER — Telehealth: Payer: Self-pay | Admitting: Neurology

## 2016-03-11 NOTE — Telephone Encounter (Signed)
[facility] Patient is ready to be scheduled for Doppler NO PA needed. Thanks Hinton Dyer.

## 2016-03-23 ENCOUNTER — Ambulatory Visit (INDEPENDENT_AMBULATORY_CARE_PROVIDER_SITE_OTHER): Payer: Medicare Other

## 2016-03-23 DIAGNOSIS — G459 Transient cerebral ischemic attack, unspecified: Secondary | ICD-10-CM

## 2016-03-28 ENCOUNTER — Telehealth: Payer: Self-pay | Admitting: Pharmacist

## 2016-03-28 MED ORDER — INSULIN GLARGINE 300 UNIT/ML ~~LOC~~ SOPN: 40.0000 [IU] | PEN_INJECTOR | Freq: Two times a day (BID) | SUBCUTANEOUS | 0 refills | Status: DC

## 2016-03-28 NOTE — Telephone Encounter (Signed)
#  2 samples of TOujeo given = about 22 day supply.  Patient was also given application for [employer] patient assistance.  She did not qualify for assistance from [facility] because she lives in [place].  Patient was instructed to return application to me once completed patient portions.

## 2016-03-31 ENCOUNTER — Telehealth: Payer: Self-pay | Admitting: Neurology

## 2016-03-31 NOTE — Telephone Encounter (Signed)
I called the patient. The carotid Doppler study was unremarkable, no evidence of carotid vessel stenosis.

## 2016-04-19 ENCOUNTER — Ambulatory Visit (INDEPENDENT_AMBULATORY_CARE_PROVIDER_SITE_OTHER): Payer: Medicare Other

## 2016-04-19 ENCOUNTER — Telehealth: Payer: Self-pay | Admitting: *Deleted

## 2016-04-19 ENCOUNTER — Other Ambulatory Visit: Payer: Self-pay | Admitting: *Deleted

## 2016-04-19 ENCOUNTER — Telehealth: Payer: Self-pay | Admitting: Family Medicine

## 2016-04-19 DIAGNOSIS — M79674 Pain in right toe(s): Secondary | ICD-10-CM

## 2016-04-19 MED ORDER — BASAGLAR KWIKPEN 100 UNIT/ML ~~LOC~~ SOPN: 40.0000 [IU] | PEN_INJECTOR | Freq: Two times a day (BID) | SUBCUTANEOUS | 0 refills | Status: DC

## 2016-04-19 NOTE — Telephone Encounter (Signed)
Patient given #2 sample pens of Basaglar (same as Lantus - dose the same) and also given patient assistance application to fill out.  Left message on VM

## 2016-04-19 NOTE — Telephone Encounter (Signed)
There is NO Toujeo samples and she is in need of something = I tried to skype you when she was here for an xray.

## 2016-05-02 ENCOUNTER — Telehealth: Payer: Self-pay | Admitting: Family Medicine

## 2016-05-02 MED ORDER — INSULIN GLARGINE 300 UNIT/ML ~~LOC~~ SOPN: 40.0000 [IU] | PEN_INJECTOR | Freq: Two times a day (BID) | SUBCUTANEOUS | 0 refills | Status: DC

## 2016-05-02 NOTE — Telephone Encounter (Signed)
patietn has been taking basaglar 40 units bid but no samples available.  Given samples of Toujeo 40 units bid #65ml Patient aware She has dropped off paperwork for PAP program and will send off.

## 2016-05-07 ENCOUNTER — Other Ambulatory Visit: Payer: Self-pay | Admitting: Family Medicine

## 2016-05-09 ENCOUNTER — Telehealth: Payer: Self-pay | Admitting: Family Medicine

## 2016-05-09 ENCOUNTER — Ambulatory Visit (INDEPENDENT_AMBULATORY_CARE_PROVIDER_SITE_OTHER): Payer: Medicare Other | Admitting: Pharmacist

## 2016-05-09 ENCOUNTER — Encounter: Payer: Self-pay | Admitting: Pharmacist

## 2016-05-09 VITALS — BP 134/60 | HR 76 | Ht 67.5 in | Wt 209.5 lb

## 2016-05-09 DIAGNOSIS — R7989 Other specified abnormal findings of blood chemistry: Secondary | ICD-10-CM

## 2016-05-09 DIAGNOSIS — Z0000 Encounter for general adult medical examination without abnormal findings: Secondary | ICD-10-CM

## 2016-05-09 DIAGNOSIS — E785 Hyperlipidemia, unspecified: Secondary | ICD-10-CM

## 2016-05-09 DIAGNOSIS — E1122 Type 2 diabetes mellitus with diabetic chronic kidney disease: Secondary | ICD-10-CM

## 2016-05-09 DIAGNOSIS — H9193 Unspecified hearing loss, bilateral: Secondary | ICD-10-CM

## 2016-05-09 DIAGNOSIS — Z23 Encounter for immunization: Secondary | ICD-10-CM

## 2016-05-09 DIAGNOSIS — R748 Abnormal levels of other serum enzymes: Secondary | ICD-10-CM

## 2016-05-09 DIAGNOSIS — N183 Chronic kidney disease, stage 3 unspecified: Secondary | ICD-10-CM

## 2016-05-09 LAB — BAYER DCA HB A1C WAIVED: HB A1C (BAYER DCA - WAIVED): 13.6 % — ABNORMAL HIGH (ref <7.0)

## 2016-05-09 MED ORDER — METFORMIN HCL 1000 MG PO TABS: 1000.0000 mg | ORAL_TABLET | Freq: Two times a day (BID) | ORAL | 1 refills | Status: DC

## 2016-05-09 MED ORDER — LOSARTAN POTASSIUM 25 MG PO TABS: 25.0000 mg | ORAL_TABLET | Freq: Every day | ORAL | 1 refills | Status: DC

## 2016-05-09 MED ORDER — PNEUMOCOCCAL 13-VAL CONJ VACC IM SUSP: 0.5000 mL | INTRAMUSCULAR | Status: DC

## 2016-05-09 MED ORDER — INSULIN GLARGINE 100 UNIT/ML SOLOSTAR PEN: 45.0000 [IU] | PEN_INJECTOR | Freq: Two times a day (BID) | SUBCUTANEOUS | 0 refills | Status: DC

## 2016-05-09 MED ORDER — METOPROLOL TARTRATE 50 MG PO TABS: 25.0000 mg | ORAL_TABLET | Freq: Two times a day (BID) | ORAL | 1 refills | Status: DC

## 2016-05-09 MED ORDER — PITAVASTATIN CALCIUM 4 MG PO TABS: 1.0000 | ORAL_TABLET | Freq: Every day | ORAL | 2 refills | Status: DC

## 2016-05-09 NOTE — Addendum Note (Signed)
Addended by: Cherre Robins on: 05/09/2016 10:29 PM   Modules accepted: Orders

## 2016-05-09 NOTE — Patient Instructions (Addendum)
[name] , Thank you for taking time to come for your Medicare Wellness Visit. I appreciate your ongoing commitment to your health goals. Please review the following plan we discussed and let me know if I can assist you in the future.   These are the goals we discussed:  Increase to Lantus to 45 units twice a day.  Continue metformin '1000mg'$  1 tablet twice a day  Increase walking - start with 10 minutes daily and work up to 30 minutes as able.   Increase non-starchy vegetables - carrots, green bean, squash, zucchini, tomatoes, onions, peppers, spinach and other green leafy vegetables, cabbage, lettuce, cucumbers, asparagus, okra (not fried), eggplant Limit sugar and processed foods (cakes, cookies, ice cream, crackers and chips) Increase fresh fruit but limit serving sizes 1/2 cup or about the size of tennis or baseball Limit red meat to no more than 1-2 times per week (serving size about the size of your palm) Choose whole grains / lean proteins - whole wheat bread, quinoa, whole grain rice (1/2 cup), fish, chicken, [place] Continue to avoid sugar and calorie containing beverages - soda, sweet tea and juice.  Choose water or unsweetened tea instead. Be cautious about too many carbonated drinks - especially not recommended for patients who has have had gastric surgery.   Goal to decrease weight to 200lbs by the end of 2017.   This is a list of the screening recommended for you and due dates:  Health Maintenance  Topic Date Due  . Shingles Vaccine  12/30/2010  . Flu Shot  Given to day in office  . Eye exam for diabetics  05/04/2016 - make appt with Dr Iona Hansen  . HIV Screening  10/01/2016*  . Pneumonia vaccines (1 of 2 - PCV13) Given in office today  . Pap Smear  06/19/2016  . Hemoglobin A1C  08/08/2016  . DEXA scan (bone density measurement)  10/09/2016  . Mammogram  12/08/2015  . Stool Blood Test  12/30/2016  . Complete foot exam   02/28/2017  . Tetanus Vaccine  04/15/2021  . Colon  Cancer Screening  07/13/2025  .  Hepatitis C: One time screening is recommended by Center for Disease Control  (CDC) for  adults born from [birth year] through 1965.   Completed  *Topic was postponed. The date shown is not the original due date.   Health Maintenance, Female Adopting a healthy lifestyle and getting preventive care can go a long way to promote health and wellness. Talk with your health care provider about what schedule of regular examinations is right for you. This is a good chance for you to check in with your provider about disease prevention and staying healthy. In between checkups, there are plenty of things you can do on your own. Experts have done a lot of research about which lifestyle changes and preventive measures are most likely to keep you healthy. Ask your health care provider for more information. WEIGHT AND DIET  Eat a healthy diet  Be sure to include plenty of vegetables, fruits, low-fat dairy products, and lean protein.  Do not eat a lot of foods high in solid fats, added sugars, or salt.  Get regular exercise. This is one of the most important things you can do for your health.  Most adults should exercise for at least 150 minutes each week. The exercise should increase your heart rate and make you sweat (moderate-intensity exercise).  Most adults should also do strengthening exercises at least twice a week. This is  in addition to the moderate-intensity exercise.  Maintain a healthy weight  Body mass index (BMI) is a measurement that can be used to identify possible weight problems. It estimates body fat based on height and weight. Your health care provider can help determine your BMI and help you achieve or maintain a healthy weight.  For females 32 years of age and older:   A BMI below 18.5 is considered underweight.  A BMI of 18.5 to 24.9 is normal.  A BMI of 25 to 29.9 is considered overweight.  A BMI of 30 and above is considered obese.  Watch levels  of cholesterol and blood lipids  You should start having your blood tested for lipids and cholesterol at 65 years of age, then have this test every 5 years.  You may need to have your cholesterol levels checked more often if:  Your lipid or cholesterol levels are high.  You are older than 65 years of age.  You are at high risk for heart disease.  CANCER SCREENING   Lung Cancer  Lung cancer screening is recommended for adults 43-6 years old who are at high risk for lung cancer because of a history of smoking.  A yearly low-dose CT scan of the lungs is recommended for people who:  Currently smoke.  Have quit within the past 15 years.  Have at least a 30-pack-year history of smoking. A pack year is smoking an average of one pack of cigarettes a day for 1 year.  Yearly screening should continue until it has been 15 years since you quit.  Yearly screening should stop if you develop a health problem that would prevent you from having lung cancer treatment.  Breast Cancer  Practice breast self-awareness. This means understanding how your breasts normally appear and feel.  It also means doing regular breast self-exams. Let your health care provider know about any changes, no matter how small.  If you are in your 20s or 30s, you should have a clinical breast exam (CBE) by a health care provider every 1-3 years as part of a regular health exam.  If you are 50 or older, have a CBE every year. Also consider having a breast X-ray (mammogram) every year.  If you have a family history of breast cancer, talk to your health care provider about genetic screening.  If you are at high risk for breast cancer, talk to your health care provider about having an MRI and a mammogram every year.  Breast cancer gene (BRCA) assessment is recommended for women who have family members with BRCA-related cancers. BRCA-related cancers include:  Breast.  Ovarian.  Tubal.  Peritoneal  cancers.  Results of the assessment will determine the need for genetic counseling and BRCA1 and BRCA2 testing. Cervical Cancer Your health care provider may recommend that you be screened regularly for cancer of the pelvic organs (ovaries, uterus, and vagina). This screening involves a pelvic examination, including checking for microscopic changes to the surface of your cervix (Pap test). You may be encouraged to have this screening done every 3 years, beginning at age 7.  For women ages 48-65, health care providers may recommend pelvic exams and Pap testing every 3 years, or they may recommend the Pap and pelvic exam, combined with testing for human papilloma virus (HPV), every 5 years. Some types of HPV increase your risk of cervical cancer. Testing for HPV may also be done on women of any age with unclear Pap test results.  Other health care  providers may not recommend any screening for nonpregnant women who are considered low risk for pelvic cancer and who do not have symptoms. Ask your health care provider if a screening pelvic exam is right for you.  If you have had past treatment for cervical cancer or a condition that could lead to cancer, you need Pap tests and screening for cancer for at least 20 years after your treatment. If Pap tests have been discontinued, your risk factors (such as having a new sexual partner) need to be reassessed to determine if screening should resume. Some women have medical problems that increase the chance of getting cervical cancer. In these cases, your health care provider may recommend more frequent screening and Pap tests. Colorectal Cancer  This type of cancer can be detected and often prevented.  Routine colorectal cancer screening usually begins at 65 years of age and continues through 65 years of age.  Your health care provider may recommend screening at an earlier age if you have risk factors for colon cancer.  Your health care provider may also  recommend using home test kits to check for hidden blood in the stool.  A small camera at the end of a tube can be used to examine your colon directly (sigmoidoscopy or colonoscopy). This is done to check for the earliest forms of colorectal cancer.  Routine screening usually begins at age 6.  Direct examination of the colon should be repeated every 5-10 years through 65 years of age. However, you may need to be screened more often if early forms of precancerous polyps or small growths are found. Skin Cancer  Check your skin from head to toe regularly.  Tell your health care provider about any new moles or changes in moles, especially if there is a change in a mole's shape or color.  Also tell your health care provider if you have a mole that is larger than the size of a pencil eraser.  Always use sunscreen. Apply sunscreen liberally and repeatedly throughout the day.  Protect yourself by wearing long sleeves, pants, a wide-brimmed hat, and sunglasses whenever you are outside. HEART DISEASE, DIABETES, AND HIGH BLOOD PRESSURE   High blood pressure causes heart disease and increases the risk of stroke. High blood pressure is more likely to develop in:  People who have blood pressure in the high end of the normal range (130-139/85-89 mm Hg).  People who are overweight or obese.  People who are African American.  If you are 29-38 years of age, have your blood pressure checked every 3-5 years. If you are 92 years of age or older, have your blood pressure checked every year. You should have your blood pressure measured twice--once when you are at a hospital or clinic, and once when you are not at a hospital or clinic. Record the average of the two measurements. To check your blood pressure when you are not at a hospital or clinic, you can use:  An automated blood pressure machine at a pharmacy.  A home blood pressure monitor.  If you are between 52 years and 60 years old, ask your health  care provider if you should take aspirin to prevent strokes.  Have regular diabetes screenings. This involves taking a blood sample to check your fasting blood sugar level.  If you are at a normal weight and have a low risk for diabetes, have this test once every three years after 65 years of age.  If you are overweight and have a  high risk for diabetes, consider being tested at a younger age or more often. PREVENTING INFECTION  Hepatitis B  If you have a higher risk for hepatitis B, you should be screened for this virus. You are considered at high risk for hepatitis B if:  You were born in a country where hepatitis B is common. Ask your health care provider which countries are considered high risk.  Your parents were born in a high-risk country, and you have not been immunized against hepatitis B (hepatitis B vaccine).  You have HIV or AIDS.  You use needles to inject street drugs.  You live with someone who has hepatitis B.  You have had sex with someone who has hepatitis B.  You get hemodialysis treatment.  You take certain medicines for conditions, including cancer, organ transplantation, and autoimmune conditions. Hepatitis C  Blood testing is recommended for:  Everyone born from [birth year] through 1965.  Anyone with known risk factors for hepatitis C. Sexually transmitted infections (STIs)  You should be screened for sexually transmitted infections (STIs) including gonorrhea and chlamydia if:  You are sexually active and are younger than 65 years of age.  You are older than 65 years of age and your health care provider tells you that you are at risk for this type of infection.  Your sexual activity has changed since you were last screened and you are at an increased risk for chlamydia or gonorrhea. Ask your health care provider if you are at risk.  If you do not have HIV, but are at risk, it may be recommended that you take a prescription medicine daily to prevent HIV  infection. This is called pre-exposure prophylaxis (PrEP). You are considered at risk if:  You are sexually active and do not regularly use condoms or know the HIV status of your partner(s).  You take drugs by injection.  You are sexually active with a partner who has HIV. Talk with your health care provider about whether you are at high risk of being infected with HIV. If you choose to begin PrEP, you should first be tested for HIV. You should then be tested every 3 months for as long as you are taking PrEP.  PREGNANCY   If you are premenopausal and you may become pregnant, ask your health care provider about preconception counseling.  If you may become pregnant, take 400 to 800 micrograms (mcg) of folic acid every day.  If you want to prevent pregnancy, talk to your health care provider about birth control (contraception). OSTEOPOROSIS AND MENOPAUSE   Osteoporosis is a disease in which the bones lose minerals and strength with aging. This can result in serious bone fractures. Your risk for osteoporosis can be identified using a bone density scan.  If you are 60 years of age or older, or if you are at risk for osteoporosis and fractures, ask your health care provider if you should be screened.  Ask your health care provider whether you should take a calcium or vitamin D supplement to lower your risk for osteoporosis.  Menopause may have certain physical symptoms and risks.  Hormone replacement therapy may reduce some of these symptoms and risks. Talk to your health care provider about whether hormone replacement therapy is right for you.  HOME CARE INSTRUCTIONS   Schedule regular health, dental, and eye exams.  Stay current with your immunizations.   Do not use any tobacco products including cigarettes, chewing tobacco, or electronic cigarettes.  If you are pregnant, do  not drink alcohol.  If you are breastfeeding, limit how much and how often you drink alcohol.  Limit  alcohol intake to no more than 1 drink per day for nonpregnant women. One drink equals 12 ounces of beer, 5 ounces of wine, or 1 ounces of hard liquor.  Do not use street drugs.  Do not share needles.  Ask your health care provider for help if you need support or information about quitting drugs.  Tell your health care provider if you often feel depressed.  Tell your health care provider if you have ever been abused or do not feel safe at home.   This information is not intended to replace advice given to you by your health care provider. Make sure you discuss any questions you have with your health care provider.   Document Released: 02/14/2011 Document Revised: 08/22/2014 Document Reviewed: 07/03/2013 [employer]vier Interactive Patient Education [employer].

## 2016-05-09 NOTE — Progress Notes (Signed)
Patient ID: [name], female   DOB: 04-Sep-1950, 65 y.o.   MRN: [id number]     Subjective:   [name] is a 65 y.o. female who presents for a subsequent Medicare Annual Wellness Visit and recheck uncontrolled type 2 DM  [name] is married and lives with her husband in [place], [place].   She frequently keeps her grandchildren at her home - 5 grandchildren - (78) 15 yo, 84 yo, 2yo and 26 months old. She very much enjoys spending time with her family and they provide positive support for her especially her daughter.  She had gastric bypass sleeve June 2015 and lost a significant amount of weight initially.  Her weight has been stable over the last year.  Current medications for diabetes include - Lantus/Toujeo 40 units BID,  metformin 1066m 1 tablet bid.    Current Medications (verified) Outpatient Encounter Prescriptions as of 05/09/2016  Medication Sig  . ACCU-CHEK AVIVA PLUS test strip CHECK BLOOD SUGAR UP TO TWICE DAILY OR AS INSTRUCTED  . aspirin EC 325 MG tablet Take 1 tablet (325 mg total) by mouth daily.  . B Complex Vitamins (B-COMPLEX/B-12 SL) Place 500 mcg under the tongue every other day.   . B-D INS SYR ULTRAFINE 1CC/31G 31G X 5/16" 1 ML MISC USE TO INJECT INSULIN TWICE A DAY AS INSTRUCTED  . Calcium Carbonate-Vitamin D (CALCIUM 600+D) 600-400 MG-UNIT per tablet Take 1 tablet by mouth 2 (two) times daily.  . cholecalciferol (VITAMIN D) 1000 UNITS tablet Take 5,000 Units by mouth every other day.   . Insulin Pen Needle 32G X 4 MM MISC Use to inject insulin with insulin pen  . losartan (COZAAR) 25 MG tablet Take 1 tablet (25 mg total) by mouth daily.  . metoprolol (LOPRESSOR) 50 MG tablet Take 0.5 tablets (25 mg total) by mouth 2 (two) times daily.  . Multiple Vitamin (MULTIVITAMIN WITH MINERALS) TABS tablet Take 1 tablet by mouth 2 (two) times daily.  . Pitavastatin Calcium (LIVALO) 4 MG TABS Take 1 tablet (4 mg total) by mouth daily.  . [DISCONTINUED] Insulin  Glargine (TOUJEO SOLOSTAR) 300 UNIT/ML SOPN Inject 40 Units into the skin 2 (two) times daily.  . [DISCONTINUED] losartan (COZAAR) 25 MG tablet TAKE ONE TABLET BY MOUTH ONE TIME DAILY  . [DISCONTINUED] metFORMIN (GLUCOPHAGE) 1000 MG tablet TAKE 1 TABLET EVERY MORNING AND 1/2 TABLET EVERY EVENING WITH FOOD (Patient taking differently: Take 1,000 mg by mouth 2 (two) times daily with a meal. TAKE 1 TABLET EVERY MORNING AND 1/2 TABLET EVERY EVENING WITH FOOD)  . [DISCONTINUED] metFORMIN (GLUCOPHAGE) 1000 MG tablet Take 1 tablet (1,000 mg total) by mouth 2 (two) times daily with a meal. TAKE 1 TABLET EVERY MORNING AND 1/2 TABLET EVERY EVENING WITH FOOD  . [DISCONTINUED] metoprolol (LOPRESSOR) 50 MG tablet Take 0.5 tablets (25 mg total) by mouth 2 (two) times daily.  . [DISCONTINUED] Pitavastatin Calcium (LIVALO) 4 MG TABS Take 1 tablet (4 mg total) by mouth daily.  . cetirizine (ZYRTEC) 10 MG tablet Take 10 mg by mouth as needed for allergies.  . hydroxypropyl methylcellulose / hypromellose (ISOPTO TEARS / GONIOVISC) 2.5 % ophthalmic solution Place 1 drop into both eyes daily as needed for dry eyes. Reported on 01/28/2016  . Insulin Glargine (LANTUS SOLOSTAR) 100 UNIT/ML Solostar Pen Inject 45 Units into the skin 2 (two) times daily.  . meclizine (ANTIVERT) 12.5 MG tablet Take 1 tablet (12.5 mg total) by mouth 3 (three) times daily as needed  for dizziness. (Patient not taking: Reported on 05/09/2016)  . sodium chloride (OCEAN) 0.65 % SOLN nasal spray Place 1 spray into both nostrils as needed for congestion. (Patient not taking: Reported on 05/09/2016)   Facility-Administered Encounter Medications as of 05/09/2016  Medication  . pneumococcal 13-valent conjugate vaccine (PREVNAR 13) injection 0.5 mL    Allergies (verified) Atorvastatin; Crestor [rosuvastatin calcium]; Ezetimibe-simvastatin; Celebrex [celecoxib]; Levemir [insulin detemir]; and Penicillins   History: Past Medical History:  Diagnosis Date    . Abnormal Doppler ultrasound of carotid artery    a. 05/2013 - mild plaque, 1-39% BICA, abnormal appearrance of thyroid (TSH wnl in 06/2013).  . Allergy    seasonal  . CAD (coronary artery disease)    a. Nonobstructive by cath 2006 - 25% LAD, 25-30% after 1st diag, distal 25% PDA, minor irreg LCx. b. Normal nuc 2011.  . Cataract    surgery  . Chronic kidney disease    stage 3  . Cystocele   . Difficult intubation 09-07-2012   big neck trouble with intubation parotid surgery"takes little med to sedate"  . Dizziness and giddiness 04/14/2014  . Dyslipidemia   . Esophageal stricture   . Family history of anesthesia complication    daughter slow to awaken after teeth extraction at age 31  . Fatty liver   . Fracture, skull ([place])   . GERD (gastroesophageal reflux disease)   . Heart murmur   . Hiatal hernia    a. 07/2013: HH with small stricture holding up barium tablet (concides with patient's sx of food sticking).  . Hyperlipidemia   . Hypertension    dr Percival Spanish  . IBS (irritable bowel syndrome)   . IDDM (insulin dependent diabetes mellitus) ([place])    type 2   . Memory difficulty 11/26/2015  . Morbid obesity ([place])   . Obesity   . Osteoarthritis   . Osteopenia   . Osteoporosis   . Palpitations    a. PVC/PAC on EKG 08/2013.  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  Marland Kitchen Pericardial effusion    a. Mod by echo 2012 at time of PNA. b. Echo 07/2012: small pericardial effusion vs fat.  Marland Kitchen PONV (postoperative nausea and vomiting)    n/v, and room spinning after cataract and parotid surgery  . Sleep apnea    CPAP currently broke, calling company about machine, pt does not know cpap settings, uses cpap off and on  . Stroke [facility]) 11-07-2014   tia  . Superficial phlebitis   . TIA (transient ischemic attack) 2016  . Toe ulcer ([place]) 01-08-14   Left big toe-being tx. at wound center "is healing"  . Varicose veins   . Vitamin D deficiency    Past Surgical History:   Procedure Laterality Date  . BREATH TEK H PYLORI N/A 07/29/2013   Procedure: BREATH TEK H PYLORI;  Surgeon: Shann Medal, MD;  Location: [facility];  Service: General;  Laterality: N/A;  . CARDIAC CATHETERIZATION     06  . CARPAL TUNNEL RELEASE  5 yrs ago   Left  . CATARACT EXTRACTION Bilateral yrs ago  . CHOLECYSTECTOMY  1982  . COLONOSCOPY N/A 07/14/2015   Procedure: COLONOSCOPY;  Surgeon: Ladene Artist, MD;  Location: [facility];  Service: Endoscopy;  Laterality: N/A;  . ESOPHAGOGASTRODUODENOSCOPY N/A 12/27/2013   Procedure: ESOPHAGOGASTRODUODENOSCOPY (EGD);  Surgeon: Shann Medal, MD;  Location: [facility];  Service: General;  Laterality: N/A;  . EYE SURGERY    .  LAPAROSCOPIC GASTRIC SLEEVE RESECTION N/A 01/20/2014   Procedure: LAPAROSCOPIC GASTRIC SLEEVE RESECTION;  Surgeon: Shann Medal, MD;  Location: [facility];  Service: General;  Laterality: N/A;  . PAROTIDECTOMY  09/07/2012   Procedure: PAROTIDECTOMY;  Surgeon: Melida Quitter, MD;  Location: [facility];  Service: ENT;  Laterality: Left;  LEFT PAROTIDECTOMY  . TONSILLECTOMY  1968  . TUBAL LIGATION  yrs ago  . UPPER GI ENDOSCOPY  01/20/2014   Procedure: UPPER GI ENDOSCOPY;  Surgeon: Shann Medal, MD;  Location: [facility];  Service: General;;   Family History  Problem Relation Age of Onset  . Heart failure Mother   . Hypertension Mother   . Skin cancer Mother   . Colitis Mother   . Diabetes Mother   . Kidney Stones Mother   . Stroke Mother   . Heart disease Father   . Colon polyps Father   . Diabetes Father   . Heart failure Father   . Heart attack Sister   . Diabetes Other   . Diabetes Daughter   . Stroke Maternal Grandfather   . Diabetes Maternal Grandfather   . Colon cancer Neg Hx    Social History   Occupational History  . retired Patent examiner   Social History Main Topics  . Smoking status: Never Smoker  . Smokeless tobacco: Never Used  . Alcohol use 0.0 oz/week     Comment: rarely  . Drug use: No  .  Sexual activity: Yes    Birth control/ protection: Post-menopausal    Do you feel safe at home?  Yes Are there smokers in your home (other than you)? No  Dietary issues and exercise activities: Current Exercise Habits: Home exercise routine, Type of exercise: walking, Time (Minutes): 10, Frequency (Times/Week): 2, Weekly Exercise (Minutes/Week): 20, Intensity: Mild  Current Dietary habits:  Patient tries to limit serving sizes.  She eats white wheat bread.  Breakfast - meat + egg, sometimes hash browns and maybe a little of her husbands gravy Lunch - lean [place], roast beef or ham sandwich Supper - meat with starch and vegetable Drinks - diet sundrop or unsweetened tea.   Objective:    Today's Vitals   05/09/16 0847  BP: 134/60  Pulse: 76  Weight: 209 lb 8 oz (95 kg)  Height: 5' 7.5" (1.715 m)  PainSc: 2   PainLoc: Knee   Body mass index is 32.33 kg/m.   A1c = 13.6% today  Activities of Daily Living In your present state of health, do you have any difficulty performing the following activities: 05/09/2016  Hearing? Y  Vision? N  Difficulty concentrating or making decisions? N  Walking or climbing stairs? N  Dressing or bathing? N  Doing errands, shopping? N  Preparing Food and eating ? N  Using the Toilet? N  In the past six months, have you accidently leaked urine? Y  Do you have problems with loss of bowel control? N  Managing your Medications? N  Managing your Finances? N  Housekeeping or managing your Housekeeping? N  Some recent data might be hidden     Cardiac Risk Factors include: advanced age (>71mn, >>40women);diabetes mellitus;dyslipidemia;family history of premature cardiovascular disease;obesity (BMI >30kg/m2);sedentary lifestyle  Depression Screen PHQ 2/9 Scores 05/09/2016 02/29/2016 01/28/2016 01/18/2016  PHQ - 2 Score 0 0 1 0     Fall Risk Fall Risk  05/09/2016 02/29/2016 01/28/2016 01/18/2016 01/04/2016  Falls in the past year? Yes Yes Yes Yes No    Number falls  in past yr: _0 -  Injury with Fall? Yes Yes Yes (No Data) -  Follow up Falls prevention discussed - Falls prevention discussed - -    Cognitive Function: MMSE - Mini Mental State Exam 05/09/2016 03/10/2016 11/26/2015 05/06/2015  Orientation to time _1 Orientation to Place _2 Registration _3 Attention/ Calculation _4 Recall _5 Language- name 2 objects _6 Language- repeat _7 Language- follow 3 step command _8 Language- read & follow direction _9 Write a sentence _10 Copy design _11 Total score _12 -    Immunizations and Health Maintenance Immunization History  Administered Date(s) Administered  . Influenza Split 05/16/2011  . Influenza Whole 05/15/2010  . Influenza, High Dose Seasonal PF 05/09/2016  . Influenza,inj,Quad PF,36+ Mos 05/22/2013, 06/03/2014, 05/14/2015  . Pneumococcal Conjugate-13 05/09/2016  . Pneumococcal Polysaccharide-23 01/21/2014   Health Maintenance Due  Topic Date Due  . ZOSTAVAX  12/30/2010  . INFLUENZA VACCINE  03/15/2016  . OPHTHALMOLOGY EXAM  05/04/2016    Patient Care Team: Chipper Herb, MD as PCP - General (Family Medicine) Melida Quitter, MD as Referring Physician (Otolaryngology) Alphonsa Overall, MD as Consulting Physician (General Surgery) Minus Breeding, MD as Consulting Physician (Cardiology) Williams Che, MD as Consulting Physician (Ophthalmology) Woodroe Mode, MD as Consulting Physician (Obstetrics and Gynecology) Ladene Artist, MD as Consulting Physician (Gastroenterology)  Indicate any recent Medical Services you may have received from other than [facility] providers in the past year (date may be approximate).    Assessment:    Annual Wellness Visit  Uncontrolled type 2 DM Vitamin D def Elevated lipase CKD   Screening Tests Health Maintenance  Topic Date Due  . ZOSTAVAX  12/30/2010  . INFLUENZA VACCINE  03/15/2016  . OPHTHALMOLOGY EXAM   05/04/2016  . HIV Screening  10/01/2016 (Originally 12/29/1965)  . PNA vac Low Risk Adult (1 of 2 - PCV13) 10/01/2016 (Originally 12/30/2015)  . PAP SMEAR  06/19/2016  . HEMOGLOBIN A1C  07/02/2016  . DEXA SCAN  10/09/2016  . MAMMOGRAM  12/07/2016  . COLON CANCER SCREENING ANNUAL FOBT  12/30/2016  . FOOT EXAM  02/28/2017  . TETANUS/TDAP  04/15/2021  . COLONOSCOPY  07/13/2025  . Hepatitis C Screening  Completed        Plan:   During the course of the visit Shawneen was educated and counseled about the following appropriate screening and preventive services:   Vaccines to include Pneumoccal, Influenza, Td, Zostavax - received influenza vaccine in office today.  Post poned zostavax and Tdap due to cost and patient doesn't want to received more than 1 vaccine at a time.  Colorectal cancer screening - UTD  Cardiovascular disease screening - last EKG 12/2015; lipids checked today, BP at goal today  Diabetes  - increase Lantus to 45 units bid.  Gave #2 samples.  We are working on getting patient meds through PAP program since she is in Medicare coverage gap.   COntinue metformin 1000g bid  Bone Denisty / Osteoporosis Screening - ordered today  Mammogram - UTD  Glaucoma screening / Diabetic Eye Exam - needed, reminded patient eye exam is due (last 04/2015)  Referral to audiologist  Nutrition counseling - patient to follow up with nutritionist at bariatric center /  [place] [facility] as planned.  In the meantime - recommended that she decrease or stop soda intake, switch to whole grains, continue to eat lean proteins.  Advanced Directives - information given  Physical Activity - encouraged to start with 10 min of walking daily and increase to 30 minutes.   Orders Placed This Encounter  Procedures  . Flu vaccine HIGH DOSE PF  . Pneumococcal conjugate vaccine 13-valent  . Bayer DCA Hb A1c Waived  . CMP14+EGFR  . Lipid panel  . Lipase  . Microalbumin / creatinine urine ratio   . LDL cholesterol, direct  . VITAMIN D 25 Hydroxy (Vit-D Deficiency, Fractures)    Patient Instructions (the written plan) were given to the patient.   Cherre Robins, PharmD   05/09/2016

## 2016-05-09 NOTE — Telephone Encounter (Signed)
Left message with dosage clarification -  Metformin 1000mg  bid

## 2016-05-10 LAB — CMP14+EGFR
ALT: 19 IU/L (ref 0–32)
AST: 15 IU/L (ref 0–40)
Albumin/Globulin Ratio: 2.2 (ref 1.2–2.2)
Albumin: 4.3 g/dL (ref 3.6–4.8)
Alkaline Phosphatase: 78 IU/L (ref 39–117)
BUN/Creatinine Ratio: 21 (ref 12–28)
BUN: 24 mg/dL (ref 8–27)
Bilirubin Total: 0.4 mg/dL (ref 0.0–1.2)
CO2: 24 mmol/L (ref 18–29)
Calcium: 9.5 mg/dL (ref 8.7–10.3)
Chloride: 101 mmol/L (ref 96–106)
Creatinine, Ser: 1.16 mg/dL — ABNORMAL HIGH (ref 0.57–1.00)
GFR calc Af Amer: 57 mL/min/{1.73_m2} — ABNORMAL LOW (ref >59)
GFR calc non Af Amer: 50 mL/min/{1.73_m2} — ABNORMAL LOW (ref >59)
Globulin, Total: 2 g/dL (ref 1.5–4.5)
Glucose: 238 mg/dL — ABNORMAL HIGH (ref 65–99)
Potassium: 4.9 mmol/L (ref 3.5–5.2)
Sodium: 141 mmol/L (ref 134–144)
Total Protein: 6.3 g/dL (ref 6.0–8.5)

## 2016-05-10 LAB — VITAMIN D 25 HYDROXY (VIT D DEFICIENCY, FRACTURES): Vit D, 25-Hydroxy: 25.4 ng/mL — ABNORMAL LOW (ref 30.0–100.0)

## 2016-05-10 LAB — LIPID PANEL
Chol/HDL Ratio: 4.9 ratio units — ABNORMAL HIGH (ref 0.0–4.4)
Cholesterol, Total: 235 mg/dL — ABNORMAL HIGH (ref 100–199)
HDL: 48 mg/dL (ref >39)
Triglycerides: 486 mg/dL — ABNORMAL HIGH (ref 0–149)

## 2016-05-10 LAB — LDL CHOLESTEROL, DIRECT: LDL Direct: 132 mg/dL — ABNORMAL HIGH (ref 0–99)

## 2016-05-10 LAB — MICROALBUMIN / CREATININE URINE RATIO
Creatinine, Urine: 117.7 mg/dL
MICROALB/CREAT RATIO: 83.2 mg/g creat — ABNORMAL HIGH (ref 0.0–30.0)
Microalbumin, Urine: 97.9 ug/mL

## 2016-05-10 LAB — LIPASE: Lipase: 97 U/L — ABNORMAL HIGH (ref 0–59)

## 2016-05-12 ENCOUNTER — Other Ambulatory Visit: Payer: Self-pay | Admitting: *Deleted

## 2016-05-12 MED ORDER — GLUCOSE BLOOD VI STRP: ORAL_STRIP | 6 refills | Status: DC

## 2016-05-16 ENCOUNTER — Other Ambulatory Visit: Payer: Self-pay | Admitting: Family Medicine

## 2016-05-17 ENCOUNTER — Telehealth: Payer: Self-pay | Admitting: Family Medicine

## 2016-05-17 MED ORDER — INSULIN PEN NEEDLE 32G X 4 MM MISC: 4 refills | Status: AC

## 2016-05-17 NOTE — Telephone Encounter (Signed)
Needles sent in

## 2016-05-30 ENCOUNTER — Telehealth: Payer: Self-pay | Admitting: Family Medicine

## 2016-05-31 MED ORDER — BASAGLAR KWIKPEN 100 UNIT/ML ~~LOC~~ SOPN: 40.0000 [IU] | PEN_INJECTOR | Freq: Every day | SUBCUTANEOUS | 0 refills | Status: DC

## 2016-05-31 NOTE — Telephone Encounter (Signed)
Application for [place] PAP has been faxed.  #1 sample of Basaglar left for patient to pick up

## 2016-06-01 ENCOUNTER — Other Ambulatory Visit: Payer: Self-pay | Admitting: Pharmacist

## 2016-06-01 DIAGNOSIS — M8580 Other specified disorders of bone density and structure, unspecified site: Secondary | ICD-10-CM

## 2016-06-06 ENCOUNTER — Telehealth: Payer: Self-pay | Admitting: Family Medicine

## 2016-06-06 MED ORDER — INSULIN DEGLUDEC 100 UNIT/ML ~~LOC~~ SOPN: 45.0000 [IU] | PEN_INJECTOR | Freq: Two times a day (BID) | SUBCUTANEOUS | 0 refills | Status: DC

## 2016-06-06 NOTE — Telephone Encounter (Signed)
Office does not have any Lantus or Basaglar but we could give her another long acting insulin Tresiba.  Left message for patient to call me to let me know her preference.

## 2016-06-07 ENCOUNTER — Encounter: Payer: Self-pay | Admitting: Pharmacist

## 2016-06-07 ENCOUNTER — Ambulatory Visit (INDEPENDENT_AMBULATORY_CARE_PROVIDER_SITE_OTHER): Payer: Medicare Other | Admitting: Pharmacist

## 2016-06-07 VITALS — BP 128/75 | Ht 68.0 in | Wt 207.0 lb

## 2016-06-07 DIAGNOSIS — E1151 Type 2 diabetes mellitus with diabetic peripheral angiopathy without gangrene: Secondary | ICD-10-CM

## 2016-06-07 MED ORDER — INSULIN NPH (HUMAN) (ISOPHANE) 100 UNIT/ML ~~LOC~~ SUSP: 50.0000 [IU] | Freq: Two times a day (BID) | SUBCUTANEOUS | 1 refills | Status: DC

## 2016-06-07 MED ORDER — INSULIN REGULAR HUMAN 100 UNIT/ML IJ SOLN: INTRAMUSCULAR | 1 refills | Status: DC

## 2016-06-07 MED ORDER — INSULIN GLARGINE 100 UNIT/ML SOLOSTAR PEN: 50.0000 [IU] | PEN_INJECTOR | Freq: Two times a day (BID) | SUBCUTANEOUS | 1 refills | Status: DC

## 2016-06-07 NOTE — Progress Notes (Signed)
Patient ID: [name], female   DOB: 04-Oct-1950, 65 y.o.   MRN: [id number]   Subjective:    [name] is a 65 y.o. female who presents for an follow up evaluation of Type 2 diabetes mellitus.  Current symptoms/problems include hyperglycemia and polyuria and have been worsening. Symptoms have been present for 1 month. She is in the Medicare coverage gap and is having difficulty affording insulin.  We are waiting to hear is she qualifies for patient assistance program.  She ran our of insulin last Friday 10/20.  She received insulin samples from [place] yesterday but BG had increased to 500.   In reviewing BG reading from her glucometer:  7 day avg = 373 14 day avg = 363 30 day avg = 328  Known diabetic complications: cardiovascular disease Cardiovascular risk factors: advanced age (older than 60 for men, 67 for women), diabetes mellitus, dyslipidemia, hypertension, obesity (BMI >= 30 kg/m2) and sedentary lifestyle Current diabetic medications include Lantus or other long acting insulin 45 units bid; metformin 1000mg  bid.  She has previously also taken Jardiance and Januvia - stopped due to cost / coverage gap.  Eye exam current (within one year): no Weight trend: decreasing steadily Current diet: patient tries to limit high CHO food but has moments of indiscretion Current exercise: occ walking Medication Compliance?  No - compliance affected by med cost    Is She on ACE inhibitor or angiotensin II receptor blocker?  Yes    losartan (Cozaar)   Objective:    BP 128/75   Ht 5\' 8"  (1.727 m)   Wt 207 lb (93.9 kg)   BMI 31.47 kg/m   Lab Review Glucose (mg/dL)  Date Value  05/09/2016 238 (H)  01/28/2016 208 (H)  10/02/2015 299 (H)   Glucose, Bld (mg/dL)  Date Value  11/07/2014 213 (H)  11/07/2014 208 (H)  09/08/2014 141 (H)   CO2 (mmol/L)  Date Value  05/09/2016 24  01/28/2016 20  10/02/2015 20   BUN (mg/dL)  Date Value  05/09/2016 24  01/28/2016 34 (H)   10/02/2015 24   Creat (mg/dL)  Date Value  09/08/2014 0.99  07/03/2013 1.17 (H)  01/24/2013 1.04   Creatinine, Ser (mg/dL)  Date Value  05/09/2016 1.16 (H)  01/28/2016 1.56 (H)  10/02/2015 1.14 (H)   Assessment:    Diabetes Mellitus type II, under inadequate control.    Plan:    1.  Rx changes: Increase long acting insulin (Lantus / Tyler Aas) to 50 units bid.    Patient given Rx for NPH and Reg insulin to check price at [facility] of Relion insulin - usually about $25/vial   Rx for Regular insulin - inject 3 units for BG 300-399; and 5 units for BG 400 or above.  2.  Reminded patient about getting yearly eye exam 3. Continuous Glucose monitor was placed today to try to get better understanding of BG trends.  Patient educated about proper care of CGM and site.  CGM was placed on back of upper left arm.  Patient will wear at least 5 days and up to 14 days.  He is to keep BG, diet and insulin administration records. He can engage in regular activities with special care when showering, changing clothes and swimming (only up to 3 feet and for 30 minutes at a time) 4. Referral to endocrinologist 5.  Recommend check BG 4  times a day 6.  Recommended increase physical activity - goal is 150 minutes per week  7. Follow up: 2 weeks

## 2016-06-09 ENCOUNTER — Telehealth: Payer: Self-pay | Admitting: Pharmacist

## 2016-06-09 NOTE — Telephone Encounter (Signed)
I have something from [employer]

## 2016-06-09 NOTE — Telephone Encounter (Signed)
[place] / PAP to check on patient's application for assitance with getting Basaglar.  Case # [id number].   They state that they need insurance information proving patient has spent at leat $1000 out of pocket for 2017.  Also need different proof of income - Medicare statement with monthly amount she received. Rep states they will send patient a letter to day requesting this information.  Also need from our office statement that patient is to get Basaglar bid instead of qd.  A letter was faxed to our office per PAP rep 06/02/16 but I have not seen yet.  Will check with our PA dept to see if they have received.  Patient notified of above by VM and reminded to call office if she has questions.

## 2016-06-13 ENCOUNTER — Other Ambulatory Visit: Payer: Self-pay | Admitting: Family Medicine

## 2016-06-20 ENCOUNTER — Ambulatory Visit (INDEPENDENT_AMBULATORY_CARE_PROVIDER_SITE_OTHER): Payer: Medicare Other | Admitting: Pharmacist

## 2016-06-20 DIAGNOSIS — E1151 Type 2 diabetes mellitus with diabetic peripheral angiopathy without gangrene: Secondary | ICD-10-CM

## 2016-06-20 NOTE — Progress Notes (Signed)
Patient ID: [name], female   DOB: 10/13/1950, 65 y.o.   MRN: [id number]   Subjective:    [name] is a 65 y.o. female who presents for an follow up evaluation of Type 2 diabetes mellitus, though she states she does not have much time today because she is suppose to go with her grandson so his pediatrician appt in [place].    At ou last visit referral was made to endocrinologist - patient has appt 07/13/2016.   CGM was placed 06/07/2016.  Patient wore for 10 days until 06/16/2016.  I have downloaded results and they will be scanned into her record.   CGM report showed the following trends:  Daily avg BG = 348 Est A1c = 13.8%  AM BG avg = 223 Lunch time BG avg = 346 Supper BG avg = 365 Bedtime BG avg = 410  Current diabetes medications:  Lantus 50 units bid; metformin 1000mg  bid.  She has previously also taken Jardiance and Januvia - stopped due to cost / coverage gap and concerns about side effects  Current symptoms/problems include hyperglycemia and have been unchagned. Symptoms have been present for 1 month.  She is in the Medicare coverage gap and is having difficulty affording insulin.  She did buy #5 pens of Lantus recently. We applied for patient assistance from [facility].  They have sent letter to patient because they need additional financial information before they can make a determination on her case.  Patient reported to me last week that she was unsure if she wanted to continue to pursue PAP assistance.  We also discussed switchign to Relion products from [place].     In reviewing BG reading from her glucometer:  7 day avg = 171 14 day avg = 181 30 day avg = 253 However one should interrupt HBG readings with caution as patient mostly checked BG once daily in AM, which according to CGM is the times of day when BG is the lowest.   AM readings - 169, 165, 175, 161, 185, 150, 185, 188, 159, 201, 178, 251, 280, 374 Noon - no readings PM - 360, 461, 518  (when she was out of insulin)  Known diabetic complications: cardiovascular disease Cardiovascular risk factors: advanced age (older than 2 for men, 78 for women), diabetes mellitus, dyslipidemia, hypertension, obesity (BMI >= 30 kg/m2) and sedentary lifestyle   Eye exam current (within one year): no Current diet: patient tries to limit high CHO food but has moments of indiscretion Current exercise: occ walking Medication Compliance?  yes    Is She on ACE inhibitor or angiotensin II receptor blocker?  Yes    losartan (Cozaar)   Objective:    Lab Review Glucose (mg/dL)  Date Value  05/09/2016 238 (H)  01/28/2016 208 (H)  10/02/2015 299 (H)   Glucose, Bld (mg/dL)  Date Value  11/07/2014 213 (H)  11/07/2014 208 (H)  09/08/2014 141 (H)   CO2 (mmol/L)  Date Value  05/09/2016 24  01/28/2016 20  10/02/2015 20   BUN (mg/dL)  Date Value  05/09/2016 24  01/28/2016 34 (H)  10/02/2015 24   Creat (mg/dL)  Date Value  09/08/2014 0.99  07/03/2013 1.17 (H)  01/24/2013 1.04   Creatinine, Ser (mg/dL)  Date Value  05/09/2016 1.16 (H)  01/28/2016 1.56 (H)  10/02/2015 1.14 (H)   Assessment:    Diabetes Mellitus type II, under inadequate control.    Plan:    1.  Rx changes:  Add  Humalog - inject 5 unit with lunch.  Will defer future diabetes medication changes for Endo to make  2.  Reminded patient about getting yearly eye exam 3.  Recommend increase checking  BG to at least 2-3 times a day. Recommended she check AM fasting and at least 1 other times during day.  4. Follow up: Will see PCP 07/05/2016; Nutritionist in 1 week; Endocrinologist appt 07/13/2016

## 2016-06-23 DIAGNOSIS — I10 Essential (primary) hypertension: Secondary | ICD-10-CM | POA: Diagnosis not present

## 2016-06-23 DIAGNOSIS — G459 Transient cerebral ischemic attack, unspecified: Secondary | ICD-10-CM | POA: Diagnosis not present

## 2016-06-23 DIAGNOSIS — H8110 Benign paroxysmal vertigo, unspecified ear: Secondary | ICD-10-CM | POA: Diagnosis not present

## 2016-06-23 DIAGNOSIS — R4182 Altered mental status, unspecified: Secondary | ICD-10-CM | POA: Diagnosis not present

## 2016-06-23 DIAGNOSIS — Z794 Long term (current) use of insulin: Secondary | ICD-10-CM | POA: Diagnosis not present

## 2016-06-23 DIAGNOSIS — I6503 Occlusion and stenosis of bilateral vertebral arteries: Secondary | ICD-10-CM | POA: Diagnosis not present

## 2016-06-23 DIAGNOSIS — E785 Hyperlipidemia, unspecified: Secondary | ICD-10-CM | POA: Diagnosis not present

## 2016-06-23 DIAGNOSIS — R29818 Other symptoms and signs involving the nervous system: Secondary | ICD-10-CM | POA: Diagnosis not present

## 2016-06-23 DIAGNOSIS — E042 Nontoxic multinodular goiter: Secondary | ICD-10-CM | POA: Diagnosis not present

## 2016-06-23 DIAGNOSIS — R4781 Slurred speech: Secondary | ICD-10-CM | POA: Diagnosis not present

## 2016-06-23 DIAGNOSIS — E876 Hypokalemia: Secondary | ICD-10-CM | POA: Diagnosis not present

## 2016-06-23 DIAGNOSIS — E119 Type 2 diabetes mellitus without complications: Secondary | ICD-10-CM | POA: Diagnosis not present

## 2016-06-23 DIAGNOSIS — Z880 Allergy status to penicillin: Secondary | ICD-10-CM | POA: Diagnosis not present

## 2016-06-23 DIAGNOSIS — Z79899 Other long term (current) drug therapy: Secondary | ICD-10-CM | POA: Diagnosis not present

## 2016-06-23 DIAGNOSIS — R51 Headache: Secondary | ICD-10-CM | POA: Diagnosis not present

## 2016-06-23 DIAGNOSIS — Z6832 Body mass index (BMI) 32.0-32.9, adult: Secondary | ICD-10-CM | POA: Diagnosis not present

## 2016-06-23 DIAGNOSIS — E669 Obesity, unspecified: Secondary | ICD-10-CM | POA: Diagnosis not present

## 2016-06-23 DIAGNOSIS — R42 Dizziness and giddiness: Secondary | ICD-10-CM | POA: Diagnosis not present

## 2016-06-23 DIAGNOSIS — R4701 Aphasia: Secondary | ICD-10-CM | POA: Diagnosis not present

## 2016-06-23 DIAGNOSIS — Z7982 Long term (current) use of aspirin: Secondary | ICD-10-CM | POA: Diagnosis not present

## 2016-06-23 DIAGNOSIS — R918 Other nonspecific abnormal finding of lung field: Secondary | ICD-10-CM | POA: Diagnosis not present

## 2016-06-24 DIAGNOSIS — I10 Essential (primary) hypertension: Secondary | ICD-10-CM | POA: Diagnosis not present

## 2016-06-24 DIAGNOSIS — E785 Hyperlipidemia, unspecified: Secondary | ICD-10-CM | POA: Diagnosis not present

## 2016-06-24 DIAGNOSIS — R29818 Other symptoms and signs involving the nervous system: Secondary | ICD-10-CM | POA: Diagnosis not present

## 2016-06-24 DIAGNOSIS — I519 Heart disease, unspecified: Secondary | ICD-10-CM | POA: Diagnosis not present

## 2016-06-24 DIAGNOSIS — I080 Rheumatic disorders of both mitral and aortic valves: Secondary | ICD-10-CM | POA: Diagnosis not present

## 2016-06-24 DIAGNOSIS — Z794 Long term (current) use of insulin: Secondary | ICD-10-CM | POA: Diagnosis not present

## 2016-06-24 DIAGNOSIS — E1159 Type 2 diabetes mellitus with other circulatory complications: Secondary | ICD-10-CM | POA: Diagnosis not present

## 2016-06-24 DIAGNOSIS — G459 Transient cerebral ischemic attack, unspecified: Secondary | ICD-10-CM | POA: Diagnosis not present

## 2016-06-24 DIAGNOSIS — H8110 Benign paroxysmal vertigo, unspecified ear: Secondary | ICD-10-CM | POA: Diagnosis not present

## 2016-06-24 DIAGNOSIS — I517 Cardiomegaly: Secondary | ICD-10-CM | POA: Diagnosis not present

## 2016-06-25 DIAGNOSIS — G459 Transient cerebral ischemic attack, unspecified: Secondary | ICD-10-CM | POA: Diagnosis not present

## 2016-06-25 DIAGNOSIS — H8110 Benign paroxysmal vertigo, unspecified ear: Secondary | ICD-10-CM | POA: Diagnosis not present

## 2016-07-01 DIAGNOSIS — R42 Dizziness and giddiness: Secondary | ICD-10-CM | POA: Diagnosis not present

## 2016-07-01 DIAGNOSIS — H73893 Other specified disorders of tympanic membrane, bilateral: Secondary | ICD-10-CM | POA: Diagnosis not present

## 2016-07-01 DIAGNOSIS — H906 Mixed conductive and sensorineural hearing loss, bilateral: Secondary | ICD-10-CM | POA: Diagnosis not present

## 2016-07-01 DIAGNOSIS — H903 Sensorineural hearing loss, bilateral: Secondary | ICD-10-CM | POA: Diagnosis not present

## 2016-07-01 DIAGNOSIS — H8110 Benign paroxysmal vertigo, unspecified ear: Secondary | ICD-10-CM | POA: Diagnosis not present

## 2016-07-04 ENCOUNTER — Ambulatory Visit: Payer: Medicare Other | Admitting: Dietician

## 2016-07-05 ENCOUNTER — Encounter: Payer: Self-pay | Admitting: Family Medicine

## 2016-07-05 ENCOUNTER — Ambulatory Visit (INDEPENDENT_AMBULATORY_CARE_PROVIDER_SITE_OTHER): Payer: Medicare Other

## 2016-07-05 ENCOUNTER — Ambulatory Visit (INDEPENDENT_AMBULATORY_CARE_PROVIDER_SITE_OTHER): Payer: Medicare Other | Admitting: Family Medicine

## 2016-07-05 VITALS — BP 139/72 | HR 78 | Temp 97.0°F | Ht 68.0 in | Wt 211.0 lb

## 2016-07-05 DIAGNOSIS — N183 Chronic kidney disease, stage 3 unspecified: Secondary | ICD-10-CM

## 2016-07-05 DIAGNOSIS — Z780 Asymptomatic menopausal state: Secondary | ICD-10-CM

## 2016-07-05 DIAGNOSIS — E7800 Pure hypercholesterolemia, unspecified: Secondary | ICD-10-CM

## 2016-07-05 DIAGNOSIS — E1151 Type 2 diabetes mellitus with diabetic peripheral angiopathy without gangrene: Secondary | ICD-10-CM | POA: Diagnosis not present

## 2016-07-05 DIAGNOSIS — M8580 Other specified disorders of bone density and structure, unspecified site: Secondary | ICD-10-CM

## 2016-07-05 DIAGNOSIS — R7989 Other specified abnormal findings of blood chemistry: Secondary | ICD-10-CM | POA: Diagnosis not present

## 2016-07-05 DIAGNOSIS — Z13820 Encounter for screening for osteoporosis: Secondary | ICD-10-CM

## 2016-07-05 DIAGNOSIS — I10 Essential (primary) hypertension: Secondary | ICD-10-CM

## 2016-07-05 DIAGNOSIS — M899 Disorder of bone, unspecified: Secondary | ICD-10-CM | POA: Diagnosis not present

## 2016-07-05 NOTE — Patient Instructions (Addendum)
Medicare Annual Wellness Visit  [facility] and the medical providers at [facility] strive to bring you the best medical care.  In doing so we not only want to address your current medical conditions and concerns but also to detect new conditions early and prevent illness, disease and health-related problems.    Medicare offers a yearly Wellness Visit which allows our clinical staff to assess your need for preventative services including immunizations, lifestyle education, counseling to decrease risk of preventable diseases and screening for fall risk and other medical concerns.    This visit is provided free of charge (no copay) for all Medicare recipients. The clinical pharmacists at [facility] have begun to conduct these Wellness Visits which will also include a thorough review of all your medications.    As you primary medical provider recommend that you make an appointment for your Annual Wellness Visit if you have not done so already this year.  You may set up this appointment before you leave today or you may call back ([phone]) and schedule an appointment.  Please make sure when you call that you mention that you are scheduling your Annual Wellness Visit with the clinical pharmacist so that the appointment may be made for the proper length of time.     Continue current medications. Continue good therapeutic lifestyle changes which include good diet and exercise. Fall precautions discussed with patient. If an FOBT was given today- please return it to our front desk. If you are over 7 years old - you may need Prevnar 66 or the adult Pneumonia vaccine.  **Flu shots are available--- please call and schedule a FLU-CLINIC appointment**  After your visit with [place] today you will receive a survey in the mail or online from [employer] regarding your care with [place]. Please take a moment to fill this out. Your feedback is very  important to [place] as you can help [place] better understand your patient needs as well as improve your experience and satisfaction. WE CARE ABOUT YOU!!!  Keep appointments with cardiology, endocrinology, gynecology, and get an appointment with ophthalmology for a good eye exam. Also keep appointment with neurology which is coming up in December. Make sure that you take a copy of the discharge noted from [facility] to the neurology visit. These make sure that you have had a recent carotid Doppler.

## 2016-07-05 NOTE — Progress Notes (Signed)
Subjective:    Patient ID: [name], female    DOB: Nov 21, 1950, 65 y.o.   MRN: [id number]  HPI Pt here for follow up and management of chronic medical problems which includes diabetes hyperlipidemia and recent TIA. She is taking medications regularly.The patient has recently had a TIA and was in the emergency room on November the night. She also saw an ear nose and throat specialist, Dr. Redmond Baseman for hearing problems and hearing loss. She also has some postural vertigo. His notes were recently reviewed. When the patient was at [facility], she did have a CT with contrast and an MRI of the brain. The patient indicates that only showed some calcium buildup and no stroke. She was put on Plavix as well as a 325 aspirin but reducd the aspirin to 81 mg and we'll discuss this further with the neurologist when she sees him on December 11. She was also placed on WelChol but has not started that and we will wait to start that until she gets her next routine blood work around the end of December. Because of the elevated blood sugar she was placed on an additional insulin injection of 5 units after lunch. She also has an appointment to see the endocrinologist to try to achieve better blood sugar control and this is coming up the end of this month. Today she denies any chest pain or shortness of breath. She does have occasional indigestion and has irritable bowel syndrome with loose stools and 4 stools. This is unchanged. She's not had any blood in the stool or black tarry bowel movements. She is passing her water without problems. Just to really address the episode that happened recently with what was thought might be a TIA was that she felt some in her ear symptoms and some nausea and this was somewhat better and then she started speaking gibberish and this is when she was taken to the emergency room [facility] because she was in [place] at [employer].   Patient Active Problem List   Diagnosis Date Noted    . Memory difficulty 11/26/2015  . Benign neoplasm of sigmoid colon 07/14/2015  . Low serum vitamin D 05/06/2015  . Vertigo 05/06/2015  . Aphasia 12/08/2014  . TIA (transient ischemic attack) 11/07/2014  . 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 CKD stage 3 due to type 2 diabetes mellitus ([place]) 06/03/2014  . Type II diabetes mellitus with renal manifestations ([place]) 06/03/2014  . Type II diabetes mellitus with peripheral circulatory disorder ([place]) 06/03/2014  . Obesity (BMI 30.0-34.9) 04/24/2014  . Dizziness and giddiness 04/14/2014  . 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 Osteopenia 10/09/2013  . Abnormal ankle brachial index 09/11/2013  . Hypomagnesemia 08/01/2013  . DYSPNEA 08/18/2010  . MYOCARDIAL PERFUSION SCAN, WITH STRESS TEST, ABNORMAL 08/04/2010  . ELECTROCARDIOGRAM, ABNORMAL 07/28/2010  . Edema extremities 12/30/2008  . Hyperlipidemia 12/26/2008  . Benign essential HTN 12/26/2008  . CAD 12/26/2008  . PERICARDIAL EFFUSION 12/26/2008  . GERD 12/26/2008  . OSTEOARTHRITIS 12/26/2008   Outpatient Encounter Prescriptions as of 07/05/2016  Medication Sig  . ACCU-CHEK AVIVA PLUS test strip CHECK BLOOD SUGAR UP TO TWICE DAILY OR AS INSTRUCTED  . aspirin EC 81 MG tablet Take 81 mg by mouth daily.  . B-D INS SYR ULTRAFINE 1CC/31G 31G X 5/16" 1 ML MISC USE TO INJECT INSULIN TWICE A DAY AS INSTRUCTED  . Calcium Carbonate-Vitamin D (CALCIUM 600+D) 600-400 MG-UNIT per tablet Take 1 tablet by mouth  2 (two) times daily.  . cholecalciferol (VITAMIN D) 1000 UNITS tablet Take 5,000 Units by mouth every other day.   . clopidogrel (PLAVIX) 75 MG tablet Take by mouth.  . colesevelam (WELCHOL) 625 MG tablet Take by mouth.  . Insulin Glargine (LANTUS SOLOSTAR) 100 UNIT/ML Solostar Pen Inject 50 Units into the skin 2 (two) times daily.  . Insulin Pen Needle 32G X 4 MM MISC Use to inject insulin with insulin pen  . losartan (COZAAR) 25 MG tablet Take 1 tablet (25 mg total) by  mouth daily.  . meclizine (ANTIVERT) 12.5 MG tablet Take 1 tablet (12.5 mg total) by mouth 3 (three) times daily as needed for dizziness.  . metFORMIN (GLUCOPHAGE) 1000 MG tablet Take 1 tablet (1,000 mg total) by mouth 2 (two) times daily with a meal.  . metoprolol (LOPRESSOR) 50 MG tablet Take 0.5 tablets (25 mg total) by mouth 2 (two) times daily.  . Multiple Vitamin (MULTIVITAMIN WITH MINERALS) TABS tablet Take 1 tablet by mouth 2 (two) times daily.  . Pitavastatin Calcium (LIVALO) 4 MG TABS Take 1 tablet (4 mg total) by mouth daily.  . sodium chloride (OCEAN) 0.65 % SOLN nasal spray Place 1 spray into both nostrils as needed for congestion.  . [DISCONTINUED] B Complex Vitamins (B-COMPLEX/B-12 SL) Place 500 mcg under the tongue every other day.   . hydroxypropyl methylcellulose / hypromellose (ISOPTO TEARS / GONIOVISC) 2.5 % ophthalmic solution Place 1 drop into both eyes daily as needed for dry eyes. Reported on 01/28/2016  . [DISCONTINUED] aspirin EC 325 MG tablet Take 1 tablet (325 mg total) by mouth daily.  . [DISCONTINUED] cetirizine (ZYRTEC) 10 MG tablet Take 10 mg by mouth as needed for allergies.   No facility-administered encounter medications on file as of 07/05/2016.         Review of Systems  Constitutional: Negative.   HENT: Negative.        Ringing in ears   Eyes: Negative.   Respiratory: Negative.   Cardiovascular: Negative.   Gastrointestinal: Negative.   Endocrine: Negative.   Genitourinary: Negative.   Musculoskeletal: Negative.   Skin: Negative.        Skin lesion right side of face   Allergic/Immunologic: Negative.   Neurological: Negative.   Hematological: Negative.   Psychiatric/Behavioral: Negative.        Objective:   Physical Exam  Constitutional: She is oriented to person, place, and time. She appears well-developed and well-nourished. No distress.  Pleasant and very alert and [place] due to all the medical problems that she has.  HENT:  Head:  Normocephalic and atraumatic.  Right Ear: External ear normal.  Left Ear: External ear normal.  Nose: Nose normal.  Mouth/Throat: No oropharyngeal exudate.  Eyes: Conjunctivae and EOM are normal. Pupils are equal, round, and reactive to light. Right eye exhibits no discharge. Left eye exhibits no discharge. No scleral icterus.  Neck: Normal range of motion. Neck supple. No thyromegaly present.  Neck has a right carotid bruit which most likely reflects from the systolic ejection murmur she has in her heart  Cardiovascular: Normal rate, regular rhythm, normal heart sounds and intact distal pulses.   No murmur heard. The heart is regular at 96/m  Pulmonary/Chest: Effort normal and breath sounds normal. No respiratory distress. She has no wheezes. She has no rales.  Clear anteriorly and posteriorly  Abdominal: Soft. Bowel sounds are normal. She exhibits no mass. There is no tenderness. There is no rebound and no guarding.  Abdominal obesity without liver or spleen enlargement tenderness or masses  Musculoskeletal: Normal range of motion. She exhibits no edema or tenderness.  Lymphadenopathy:    She has no cervical adenopathy.  Neurological: She is alert and oriented to person, place, and time. She has normal reflexes. No cranial nerve deficit.  Good strength bilaterally with normal reflexes  Skin: Skin is warm and dry. No rash noted.  Psychiatric: She has a normal mood and affect. Her behavior is normal. Judgment and thought content normal.  Nursing note and vitals reviewed.   BP 139/72 (BP Location: Left Arm)   Pulse 78   Temp 97 F (36.1 C) (Oral)   Ht 5' 8"  (1.727 m)   Wt 211 lb (95.7 kg)   BMI 32.08 kg/m        1. Pure hypercholesterolemia  -Continue with aggressive therapeutic lifestyle changes including diet exercise and good blood sugar control - Lipid panel; Future  2. Type II diabetes mellitus with peripheral circulatory disorder [facility]) -Follow-up with endocrinology as  planned - Bayer DCA Hb A1c Waived; Future  3. Low serum vitamin D -Continue current treatment - DG WR[facility] DEXA; Future  4. Chronic kidney disease, stage 3 (moderate) -Continue to avoid all NSAIDs and keep blood pressure under as good of control as possible - BMP8+EGFR; Future  5. Benign essential HTN -Continue current treatment - BMP8+EGFR; Future - Hepatic function panel; Future  6. Postmenopausal - DG WRFM DEXA; Future  7. Screening for osteoporosis - DG WR[facility] DEXA; Future  8. Recent TIA? -Full workup done at [facility] with brain imaging and echocardiogram  Patient Instructions                       Medicare Annual Wellness Visit  [facility] and the medical providers at [facility] strive to bring you the best medical care.  In doing so we not only want to address your current medical conditions and concerns but also to detect new conditions early and prevent illness, disease and health-related problems.    Medicare offers a yearly Wellness Visit which allows our clinical staff to assess your need for preventative services including immunizations, lifestyle education, counseling to decrease risk of preventable diseases and screening for fall risk and other medical concerns.    This visit is provided free of charge (no copay) for all Medicare recipients. The clinical pharmacists at [facility] have begun to conduct these Wellness Visits which will also include a thorough review of all your medications.    As you primary medical provider recommend that you make an appointment for your Annual Wellness Visit if you have not done so already this year.  You may set up this appointment before you leave today or you may call back ([phone]) and schedule an appointment.  Please make sure when you call that you mention that you are scheduling your Annual Wellness Visit with the clinical pharmacist so that the appointment may be made for the  proper length of time.     Continue current medications. Continue good therapeutic lifestyle changes which include good diet and exercise. Fall precautions discussed with patient. If an FOBT was given today- please return it to our front desk. If you are over 21 years old - you may need Prevnar 35 or the adult Pneumonia vaccine.  **Flu shots are available--- please call and schedule a FLU-CLINIC appointment**  After your visit with [place] today you will receive a survey  in the mail or online from [employer] regarding your care with [place]. Please take a moment to fill this out. Your feedback is very important to [place] as you can help [place] better understand your patient needs as well as improve your experience and satisfaction. WE CARE ABOUT YOU!!!  Keep appointments with cardiology, endocrinology, gynecology, and get an appointment with ophthalmology for a good eye exam. Also keep appointment with neurology which is coming up in December. Make sure that you take a copy of the discharge noted from [facility] to the neurology visit. These make sure that you have had a recent carotid Doppler.   Arrie Senate MD

## 2016-07-11 ENCOUNTER — Other Ambulatory Visit: Payer: Self-pay | Admitting: Pharmacist

## 2016-07-13 DIAGNOSIS — E7800 Pure hypercholesterolemia, unspecified: Secondary | ICD-10-CM | POA: Diagnosis not present

## 2016-07-13 DIAGNOSIS — E1165 Type 2 diabetes mellitus with hyperglycemia: Secondary | ICD-10-CM | POA: Diagnosis not present

## 2016-07-13 DIAGNOSIS — E669 Obesity, unspecified: Secondary | ICD-10-CM | POA: Diagnosis not present

## 2016-07-13 DIAGNOSIS — R809 Proteinuria, unspecified: Secondary | ICD-10-CM | POA: Diagnosis not present

## 2016-07-25 ENCOUNTER — Encounter: Payer: Self-pay | Admitting: Neurology

## 2016-07-25 ENCOUNTER — Ambulatory Visit (INDEPENDENT_AMBULATORY_CARE_PROVIDER_SITE_OTHER): Payer: Medicare Other | Admitting: Neurology

## 2016-07-25 VITALS — BP 149/79 | HR 86 | Ht 68.0 in | Wt 217.0 lb

## 2016-07-25 DIAGNOSIS — R413 Other amnesia: Secondary | ICD-10-CM | POA: Diagnosis not present

## 2016-07-25 DIAGNOSIS — R4701 Aphasia: Secondary | ICD-10-CM | POA: Diagnosis not present

## 2016-07-25 NOTE — Progress Notes (Signed)
Reason for visit: TIA  [name] is an 65 y.o. female  History of present illness:  [name] is a 65 year old right-handed white female with a history of episodes of transient aphasia. The patient apparently has had 2 events since last seen, one occurred about 2 or 3 months ago and was transient and short-lived lasting only about 5 minutes. The patient had another event on 06/23/2016, she went to the emergency room at [facility] and was admitted overnight. The evaluation revealed an unremarkable 2D echocardiogram, MRI of the brain did not show an acute stroke. 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 The patient was placed on aspirin and Plavix combination. She has not had any further events since coming out of the hospital. The patient reports no associated headache, she denies any changes in vision or numbness or weakness of the face, arms, or legs with the episodes. The patient noted that she had difficulty dialing a telephone number during the event, and she has difficulty generating speech. She denies any dizziness or vertigo with the episodes, but she did have some nausea in the hospital during the workup. The patient had a blood sugar of 240 when admitted to the hospital. She comes back to this office for an evaluation.  Past Medical History:  Diagnosis Date  . Abnormal Doppler ultrasound of carotid artery    a. 05/2013 - mild plaque, 1-39% BICA, abnormal appearrance of thyroid (TSH wnl in 06/2013).  . Allergy    seasonal  . CAD (coronary artery disease)    a. Nonobstructive by cath 2006 - 25% LAD, 25-30% after 1st diag, distal 25% PDA, minor irreg LCx. b. Normal nuc 2011.  . Cataract    surgery  . Chronic kidney disease    stage 3  . Cystocele   . Difficult intubation 09-07-2012   big neck  trouble with intubation parotid surgery"takes little med to sedate"  . Dizziness and giddiness 04/14/2014  . Dyslipidemia   . Esophageal stricture   . Family history of anesthesia complication    daughter slow to awaken after teeth extraction at age 88  . Fatty liver   . Fracture, skull ([place])   . GERD (gastroesophageal reflux disease)   . Heart murmur   . Hiatal hernia    a. 07/2013: HH with small stricture holding up barium tablet (concides with patient's sx of food sticking).  . Hyperlipidemia   . Hypertension    dr Percival Spanish  . IBS (irritable bowel syndrome)   . IDDM (insulin dependent diabetes mellitus) ([place])    type 2   . Memory difficulty 11/26/2015  . Morbid obesity ([place])   . Obesity   . Osteoarthritis   . Osteopenia   . Osteoporosis   . Palpitations    a. PVC/PAC on EKG 08/2013.  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  Marland Kitchen Pericardial effusion    a. Mod by echo 2012 at time of PNA. b. Echo 07/2012: small pericardial effusion vs fat.  Marland Kitchen PONV (postoperative nausea and vomiting)    n/v, and room spinning after cataract and parotid surgery  . Sleep apnea    CPAP currently broke, calling company about machine, pt does not know cpap settings, uses cpap off and on  . Stroke [facility]) 11-07-2014  tia  . Superficial phlebitis   . TIA (transient ischemic attack) 2016  . Toe ulcer ([place]) 01-08-14   Left big toe-being tx. at wound center "is healing"  . Varicose veins   . Vitamin D deficiency     Past Surgical History:  Procedure Laterality Date  . BREATH TEK H PYLORI N/A 07/29/2013   Procedure: BREATH TEK H PYLORI;  Surgeon: Shann Medal, MD;  Location: [facility];  Service: General;  Laterality: N/A;  . CARDIAC CATHETERIZATION     06  . CARPAL TUNNEL RELEASE  5 yrs ago   Left  . CATARACT EXTRACTION Bilateral yrs ago  . CHOLECYSTECTOMY  1982  . COLONOSCOPY N/A 07/14/2015   Procedure: COLONOSCOPY;  Surgeon: Ladene Artist, MD;  Location: [facility];   Service: Endoscopy;  Laterality: N/A;  . ESOPHAGOGASTRODUODENOSCOPY N/A 12/27/2013   Procedure: ESOPHAGOGASTRODUODENOSCOPY (EGD);  Surgeon: Shann Medal, MD;  Location: [facility];  Service: General;  Laterality: N/A;  . EYE SURGERY    . LAPAROSCOPIC GASTRIC SLEEVE RESECTION N/A 01/20/2014   Procedure: LAPAROSCOPIC GASTRIC SLEEVE RESECTION;  Surgeon: Shann Medal, MD;  Location: [facility];  Service: General;  Laterality: N/A;  . PAROTIDECTOMY  09/07/2012   Procedure: PAROTIDECTOMY;  Surgeon: Melida Quitter, MD;  Location: [facility];  Service: ENT;  Laterality: Left;  LEFT PAROTIDECTOMY  . TONSILLECTOMY  1968  . TUBAL LIGATION  yrs ago  . UPPER GI ENDOSCOPY  01/20/2014   Procedure: UPPER GI ENDOSCOPY;  Surgeon: Shann Medal, MD;  Location: [facility];  Service: General;;    Family History  Problem Relation Age of Onset  . Heart failure Mother   . Hypertension Mother   . Skin cancer Mother   . Colitis Mother   . Diabetes Mother   . Kidney Stones Mother   . Stroke Mother   . Heart disease Father   . Colon polyps Father   . Diabetes Father   . Heart failure Father   . Heart attack Sister   . Diabetes Other   . Diabetes Daughter   . Stroke Maternal Grandfather   . Diabetes Maternal Grandfather   . Colon cancer Neg Hx     Social history:  reports that she has never smoked. She has never used smokeless tobacco. She reports that she drinks alcohol. She reports that she uses drugs, including Other-see comments.    Allergies  Allergen Reactions  . Atorvastatin     Myalgias   . Crestor [Rosuvastatin Calcium] Other (See Comments)    Stiffness and back pain  . Ezetimibe-Simvastatin Other (See Comments)    Leg cramps  . Celebrex [Celecoxib] Rash  . Levemir [Insulin Detemir] Rash  . Penicillins Other (See Comments)    SYNCOPE (Tolerated Ancef) Has patient had a PCN reaction causing immediate rash, facial/tongue/throat swelling, SOB or lightheadedness with hypotension: Yes Has patient had a  PCN reaction causing severe rash involving mucus membranes or skin necrosis: No Has patient had a PCN reaction that required hospitalization No Has patient had a PCN reaction occurring within the last 10 years: No If all of the above answers are "NO", then may proceed with Cephalosporin use.    Medications:  Prior to Admission medications   Medication Sig Start Date End Date Taking? Authorizing Provider  ACCU-CHEK AVIVA PLUS test strip CHECK BLOOD SUGAR UP TO TWICE DAILY OR AS INSTRUCTED 05/17/16  Yes Chipper Herb, MD  aspirin EC 81 MG tablet Take 81 mg by mouth daily.  Yes Historical Provider, MD  B-D INS SYR ULTRAFINE 1CC/31G 31G X 5/16" 1 ML MISC USE TO INJECT INSULIN TWICE A DAY AS INSTRUCTED 11/16/15  Yes Chipper Herb, MD  Calcium Carbonate-Vitamin D (CALCIUM 600+D) 600-400 MG-UNIT per tablet Take 1 tablet by mouth 2 (two) times daily. 05/06/15  Yes Cherre Robins, PharmD  cholecalciferol (VITAMIN D) 1000 UNITS tablet Take 5,000 Units by mouth every other day.    Yes Historical Provider, MD  clopidogrel (PLAVIX) 75 MG tablet Take by mouth. 06/26/16 06/26/17 Yes Historical Provider, MD  Insulin Pen Needle 32G X 4 MM MISC Use to inject insulin with insulin pen 05/17/16  Yes Chipper Herb, MD  losartan (COZAAR) 25 MG tablet Take 1 tablet (25 mg total) by mouth daily. 05/09/16  Yes Chipper Herb, MD  meclizine (ANTIVERT) 12.5 MG tablet Take 1 tablet (12.5 mg total) by mouth 3 (three) times daily as needed for dizziness. 01/30/15  Yes Chipper Herb, MD  metFORMIN (GLUCOPHAGE) 1000 MG tablet Take 1 tablet (1,000 mg total) by mouth 2 (two) times daily with a meal. 05/09/16  Yes Cherre Robins, PharmD  metoprolol (LOPRESSOR) 50 MG tablet Take 0.5 tablets (25 mg total) by mouth 2 (two) times daily. 05/09/16  Yes Chipper Herb, MD  Multiple Vitamin (MULTIVITAMIN WITH MINERALS) TABS tablet Take 1 tablet by mouth 2 (two) times daily.   Yes Historical Provider, MD  NOVOLIN 70/30 RELION (70-30) 100 UNIT/ML  injection  07/13/16  Yes Historical Provider, MD  Pitavastatin Calcium (LIVALO) 4 MG TABS Take 1 tablet (4 mg total) by mouth daily. 05/09/16  Yes Chipper Herb, MD  sodium chloride (OCEAN) 0.65 % SOLN nasal spray Place 1 spray into both nostrils as needed for congestion. 11/09/14  Yes Domenic Polite, MD    ROS:  Out of a complete 14 system review of symptoms, the patient complains only of the following symptoms, and all other reviewed systems are negative.  Hearing loss, ringing in the ears Eye itching, blurred vision incontinence of bladder Back pain, muscle cramps Moles memory loss, speech difficulty    Blood pressure (!) 149/79, pulse 86, height 5\' 8"  (1.727 m), weight 217 lb (98.4 kg).  Physical Exam  General: The patient is alert and cooperative at the time of the examination.The patient is markedly obese.   Skin: 1+ edema at the ankles is noted bilaterally..   Neurologic Exam  Mental status: The patient is alert and oriented x 3 at the time of the examination. The patient has apparent normal recent and remote memory, with an apparently normal attention span and concentration ability.Mini-Mental status examination done today shows a total score 29/30.   Cranial nerves: Facial symmetry is present. Speech is normal, no aphasia or dysarthria is noted. Extraocular movements are full. Visual fields are full.  Motor: The patient has good strength in all 4 extremities.  Sensory examination: Soft touch sensation is symmetric on the face, arms, and legs.  Coordination: The patient has good finger-nose-finger and heel-to-shin bilaterally.  Gait and station: The patient has a normal gait. Tandem gait is slightly unsteady. Romberg is negative. No drift is seen.  Reflexes: Deep tendon reflexes are symmetric.   Assessment/Plan:  1. Transient episodes of aphasia   2. Mild memory disturbance  3. Diabetes  The patient does have evidence of cerebrovascular disease with stenosis  of the left P2 segment and evidence of stenosis bilaterally of the origins of the vertebral arteries, severe on the right and moderate  on the left. The patient likely is having episodes of decreased cerebral perfusion associated with these fixed areas of stenosis. The patient will remain on aspirin and Plavix combination for least the next 3 or 4 months, we will follow-up at that time. The patient will contact our office if further events are noted. The patient is not having headaches with the events, the possibility of migraine therefore is less likely.  Jill Alexanders MD 07/25/2016 10:15 AM  [facility] [address] [place], [place] [address]  Phone [phone] Fax [phone]

## 2016-07-25 NOTE — Patient Instructions (Signed)
   Continue the aspirin 81 mg and plavix 75 mg and stay well hydrated. Call if you continue to have any problems.

## 2016-07-26 ENCOUNTER — Telehealth: Payer: Self-pay | Admitting: *Deleted

## 2016-07-26 NOTE — Telephone Encounter (Signed)
Release faxed to [facility] requesting notes, imaging reports.

## 2016-08-18 ENCOUNTER — Telehealth: Payer: Self-pay | Admitting: Family Medicine

## 2016-08-18 MED ORDER — GLUCOSE BLOOD VI STRP: ORAL_STRIP | 5 refills | Status: DC

## 2016-08-18 NOTE — Telephone Encounter (Signed)
rx for test strips sent over to the pharmacy and left detailed voicemail advising pt that rx was sent to the pharmacy.

## 2016-08-19 ENCOUNTER — Other Ambulatory Visit: Payer: Self-pay | Admitting: Family Medicine

## 2016-08-19 MED ORDER — GLUCOSE BLOOD VI STRP: ORAL_STRIP | 5 refills | Status: DC

## 2016-08-19 NOTE — Telephone Encounter (Signed)
rx sent to [facility] and pt is aware.

## 2016-08-20 ENCOUNTER — Telehealth: Payer: Self-pay | Admitting: Family Medicine

## 2016-08-22 MED ORDER — GLUCOSE BLOOD VI STRP: ORAL_STRIP | 5 refills | Status: DC

## 2016-08-22 NOTE — Telephone Encounter (Signed)
Rx was sent to pharmacy and pt is aware.

## 2016-08-23 ENCOUNTER — Encounter (HOSPITAL_COMMUNITY): Payer: Self-pay

## 2016-09-08 ENCOUNTER — Other Ambulatory Visit: Payer: Self-pay | Admitting: Family Medicine

## 2016-09-26 ENCOUNTER — Other Ambulatory Visit: Payer: Self-pay | Admitting: Obstetrics & Gynecology

## 2016-09-26 DIAGNOSIS — Z1231 Encounter for screening mammogram for malignant neoplasm of breast: Secondary | ICD-10-CM | POA: Diagnosis not present

## 2016-09-26 DIAGNOSIS — Z6833 Body mass index (BMI) 33.0-33.9, adult: Secondary | ICD-10-CM | POA: Diagnosis not present

## 2016-09-26 DIAGNOSIS — R32 Unspecified urinary incontinence: Secondary | ICD-10-CM | POA: Diagnosis not present

## 2016-09-26 DIAGNOSIS — Z124 Encounter for screening for malignant neoplasm of cervix: Secondary | ICD-10-CM | POA: Diagnosis not present

## 2016-09-27 DIAGNOSIS — H35033 Hypertensive retinopathy, bilateral: Secondary | ICD-10-CM | POA: Diagnosis not present

## 2016-09-27 DIAGNOSIS — Z961 Presence of intraocular lens: Secondary | ICD-10-CM | POA: Diagnosis not present

## 2016-09-27 DIAGNOSIS — E113293 Type 2 diabetes mellitus with mild nonproliferative diabetic retinopathy without macular edema, bilateral: Secondary | ICD-10-CM | POA: Diagnosis not present

## 2016-09-27 DIAGNOSIS — H04123 Dry eye syndrome of bilateral lacrimal glands: Secondary | ICD-10-CM | POA: Diagnosis not present

## 2016-09-27 LAB — CYTOLOGY - PAP

## 2016-09-27 LAB — HM DIABETES EYE EXAM

## 2016-10-03 ENCOUNTER — Emergency Department (HOSPITAL_BASED_OUTPATIENT_CLINIC_OR_DEPARTMENT_OTHER)
Admission: EM | Admit: 2016-10-03 | Discharge: 2016-10-03 | Disposition: A | Discharge status: Home / Self Care | Payer: Medicare Other | Attending: Emergency Medicine | Admitting: Emergency Medicine

## 2016-10-03 ENCOUNTER — Ambulatory Visit: Payer: Medicare Other | Admitting: Pediatrics

## 2016-10-03 ENCOUNTER — Encounter (HOSPITAL_BASED_OUTPATIENT_CLINIC_OR_DEPARTMENT_OTHER): Payer: Self-pay | Admitting: *Deleted

## 2016-10-03 ENCOUNTER — Emergency Department (HOSPITAL_BASED_OUTPATIENT_CLINIC_OR_DEPARTMENT_OTHER): Payer: Medicare Other

## 2016-10-03 DIAGNOSIS — Z7984 Long term (current) use of oral hypoglycemic drugs: Secondary | ICD-10-CM | POA: Diagnosis not present

## 2016-10-03 DIAGNOSIS — Z79899 Other long term (current) drug therapy: Secondary | ICD-10-CM | POA: Diagnosis not present

## 2016-10-03 DIAGNOSIS — I2510 Atherosclerotic heart disease of native coronary artery without angina pectoris: Secondary | ICD-10-CM | POA: Diagnosis not present

## 2016-10-03 DIAGNOSIS — E1122 Type 2 diabetes mellitus with diabetic chronic kidney disease: Secondary | ICD-10-CM | POA: Diagnosis not present

## 2016-10-03 DIAGNOSIS — M549 Dorsalgia, unspecified: Secondary | ICD-10-CM | POA: Insufficient documentation

## 2016-10-03 DIAGNOSIS — R112 Nausea with vomiting, unspecified: Secondary | ICD-10-CM | POA: Insufficient documentation

## 2016-10-03 DIAGNOSIS — I129 Hypertensive chronic kidney disease with stage 1 through stage 4 chronic kidney disease, or unspecified chronic kidney disease: Secondary | ICD-10-CM | POA: Diagnosis not present

## 2016-10-03 DIAGNOSIS — N183 Chronic kidney disease, stage 3 (moderate): Secondary | ICD-10-CM | POA: Diagnosis not present

## 2016-10-03 DIAGNOSIS — Z7982 Long term (current) use of aspirin: Secondary | ICD-10-CM | POA: Diagnosis not present

## 2016-10-03 DIAGNOSIS — R109 Unspecified abdominal pain: Secondary | ICD-10-CM

## 2016-10-03 DIAGNOSIS — R1031 Right lower quadrant pain: Secondary | ICD-10-CM | POA: Insufficient documentation

## 2016-10-03 LAB — CBC
HCT: 44.3 % (ref 36.0–46.0)
Hemoglobin: 14.8 g/dL (ref 12.0–15.0)
MCH: 28.4 pg (ref 26.0–34.0)
MCHC: 33.4 g/dL (ref 30.0–36.0)
MCV: 84.9 fL (ref 78.0–100.0)
Platelets: 222 10*3/uL (ref 150–400)
RBC: 5.22 MIL/uL — ABNORMAL HIGH (ref 3.87–5.11)
RDW: 13.5 % (ref 11.5–15.5)
WBC: 9.5 10*3/uL (ref 4.0–10.5)

## 2016-10-03 LAB — URINALYSIS, ROUTINE W REFLEX MICROSCOPIC
Bilirubin Urine: NEGATIVE
Glucose, UA: NEGATIVE mg/dL
Hgb urine dipstick: NEGATIVE
Ketones, ur: NEGATIVE mg/dL
Leukocytes, UA: NEGATIVE
Nitrite: NEGATIVE
Protein, ur: NEGATIVE mg/dL
Specific Gravity, Urine: 1.009 (ref 1.005–1.030)
pH: 5 (ref 5.0–8.0)

## 2016-10-03 LAB — COMPREHENSIVE METABOLIC PANEL
ALT: 23 U/L (ref 14–54)
AST: 22 U/L (ref 15–41)
Albumin: 3.6 g/dL (ref 3.5–5.0)
Alkaline Phosphatase: 64 U/L (ref 38–126)
Anion gap: 10 (ref 5–15)
BUN: 28 mg/dL — ABNORMAL HIGH (ref 6–20)
CO2: 26 mmol/L (ref 22–32)
Calcium: 9 mg/dL (ref 8.9–10.3)
Chloride: 104 mmol/L (ref 101–111)
Creatinine, Ser: 1.38 mg/dL — ABNORMAL HIGH (ref 0.44–1.00)
GFR calc Af Amer: 45 mL/min — ABNORMAL LOW (ref >60)
GFR calc non Af Amer: 39 mL/min — ABNORMAL LOW (ref >60)
Glucose, Bld: 158 mg/dL — ABNORMAL HIGH (ref 65–99)
Potassium: 4.1 mmol/L (ref 3.5–5.1)
Sodium: 140 mmol/L (ref 135–145)
Total Bilirubin: 0.7 mg/dL (ref 0.3–1.2)
Total Protein: 6.9 g/dL (ref 6.5–8.1)

## 2016-10-03 LAB — LIPASE, BLOOD: Lipase: 40 U/L (ref 11–51)

## 2016-10-03 LAB — CBG MONITORING, ED: Glucose-Capillary: 168 mg/dL — ABNORMAL HIGH (ref 65–99)

## 2016-10-03 MED ORDER — DICYCLOMINE HCL 20 MG PO TABS: 20.0000 mg | ORAL_TABLET | Freq: Two times a day (BID) | ORAL | 0 refills | Status: DC | PRN

## 2016-10-03 MED ORDER — SODIUM CHLORIDE 0.9 % IV BOLUS (SEPSIS)
500.0000 mL | Freq: Once | INTRAVENOUS | Status: AC
Start: 2016-10-03 — End: 2016-10-03
  Administered 2016-10-03: 500 mL via INTRAVENOUS

## 2016-10-03 MED ORDER — ONDANSETRON HCL 4 MG PO TABS: 4.0000 mg | ORAL_TABLET | Freq: Four times a day (QID) | ORAL | 0 refills | Status: DC | PRN

## 2016-10-03 MED ORDER — IOPAMIDOL (ISOVUE-300) INJECTION 61%
80.0000 mL | Freq: Once | INTRAVENOUS | Status: AC | PRN
  Administered 2016-10-03: 80 mL via INTRAVENOUS

## 2016-10-03 NOTE — ED Notes (Signed)
Alert, NAD, calm, interactive, resps e/u, speaking in clear complete sentences, no dyspnea noted, skin W&D, VSS, (denies: sob, nausea, dizziness or visual changes). Family at [facility].

## 2016-10-03 NOTE — ED Notes (Signed)
Pt unable to give UA at this time 

## 2016-10-03 NOTE — ED Notes (Signed)
Pt in CT, not in room.

## 2016-10-03 NOTE — ED Notes (Signed)
Patient transported to CT 

## 2016-10-03 NOTE — ED Notes (Signed)
Checked patient at 8:00 pm rounding and she inquired about the results of her test.

## 2016-10-03 NOTE — ED Notes (Signed)
Up standing in room, eating, alert, NAD, calm, interactive, "feel better", (denies: pain, sob, nausea or dizziness), family at side.

## 2016-10-03 NOTE — ED Provider Notes (Signed)
[facility] DEPT [facility] Provider Note   CSN: [id number] Arrival date & time: 10/03/16  1509  By signing my name below, I, Reola Mosher, attest that this documentation has been prepared under the direction and in the presence of Julianne Rice, MD. Electronically Signed: Reola Mosher, ED Scribe. 10/03/16. 5:58 PM.  History   Chief Complaint Chief Complaint  Patient presents with  . Abdominal Pain   HPI Comments: [name] is a 66 y.o. female with a h/o DM, CKD stage 3, CAD, IBS, and obesity, who presents to the Emergency Department complaining of intermittent right-sided abdominal pain beginning yesterday. She notes associated nausea and vomiting secondary to her pain. She states that the contents of her vomitus have been "grey", but otherwise has been non-bloody. Pt notes that with rest her pain is not present, however, her pain is precipitated with and exacerbated with generalized movements. She also notes that with movement her pain radiates into her right flank region. Pt has a h/o similar pain in the past, but notes that it resolved on its own at that time. No noted medicinal treatments were tried prior to coming into the ED. Pt has a PSHx to the abdomen including a cholecystectomy and gastric sleeve placement. She denies fever, chills, diaphoresis, dysuria, hematuria, leg swelling, leg pain, or any other associated symptoms.   The history is provided by the patient. No language interpreter was used.   Past Medical History:  Diagnosis Date  . Abnormal Doppler ultrasound of carotid artery    a. 05/2013 - mild plaque, 1-39% BICA, abnormal appearrance of thyroid (TSH wnl in 06/2013).  . Allergy    seasonal  . CAD (coronary artery disease)    a. Nonobstructive by cath 2006 - 25% LAD, 25-30% after 1st diag, distal 25% PDA, minor irreg LCx. b. Normal nuc 2011.  . Cataract    surgery  . Chronic kidney disease    stage 3  . Cystocele   . Difficult intubation  09-07-2012   big neck trouble with intubation parotid surgery"takes little med to sedate"  . Dizziness and giddiness 04/14/2014  . Dyslipidemia   . Esophageal stricture   . Family history of anesthesia complication    daughter slow to awaken after teeth extraction at age 72  . Fatty liver   . Fracture, skull ([place])   . GERD (gastroesophageal reflux disease)   . Heart murmur   . Hiatal hernia    a. 07/2013: HH with small stricture holding up barium tablet (concides with patient's sx of food sticking).  . Hyperlipidemia   . Hypertension    dr Percival Spanish  . IBS (irritable bowel syndrome)   . IDDM (insulin dependent diabetes mellitus) ([place])    type 2   . Memory difficulty 11/26/2015  . Morbid obesity ([place])   . Obesity   . Osteoarthritis   . Osteopenia   . Osteoporosis   . Palpitations    a. PVC/PAC on EKG 08/2013.  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  Marland Kitchen Pericardial effusion    a. Mod by echo 2012 at time of PNA. b. Echo 07/2012: small pericardial effusion vs fat.  Marland Kitchen PONV (postoperative nausea and vomiting)    n/v, and room spinning after cataract and parotid surgery  . Sleep apnea    CPAP currently broke, calling company about machine, pt does not know cpap settings, uses cpap off and on  . Stroke [facility]) 11-07-2014   tia  .  Superficial phlebitis   . TIA (transient ischemic attack) 2016  . Toe ulcer ([place]) 01-08-14   Left big toe-being tx. at wound center "is healing"  . Varicose veins   . Vitamin D deficiency     Patient Active Problem List   Diagnosis Date Noted  . Memory difficulty 11/26/2015  . Benign neoplasm of sigmoid colon 07/14/2015  . Low serum vitamin D 05/06/2015  . Vertigo 05/06/2015  . Aphasia 12/08/2014  . TIA (transient ischemic attack) 11/07/2014  . 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 Type II diabetes mellitus with renal manifestations ([place]) 06/03/2014  . Type II  diabetes mellitus with peripheral circulatory disorder ([place]) 06/03/2014  . Obesity (BMI 30.0-34.9) 04/24/2014  . Dizziness and giddiness 04/14/2014  . 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 Osteopenia 10/09/2013  . Abnormal ankle brachial index 09/11/2013  . Hypomagnesemia 08/01/2013  . DYSPNEA 08/18/2010  . MYOCARDIAL PERFUSION SCAN, WITH STRESS TEST, ABNORMAL 08/04/2010  . ELECTROCARDIOGRAM, ABNORMAL 07/28/2010  . Edema extremities 12/30/2008  . Hyperlipidemia 12/26/2008  . Benign essential HTN 12/26/2008  . CAD 12/26/2008  . PERICARDIAL EFFUSION 12/26/2008  . GERD 12/26/2008  . OSTEOARTHRITIS 12/26/2008    Past Surgical History:  Procedure Laterality Date  . BREATH TEK H PYLORI N/A 07/29/2013   Procedure: BREATH TEK H PYLORI;  Surgeon: Shann Medal, MD;  Location: [facility];  Service: General;  Laterality: N/A;  . CARDIAC CATHETERIZATION     06  . CARPAL TUNNEL RELEASE  5 yrs ago   Left  . CATARACT EXTRACTION Bilateral yrs ago  . CHOLECYSTECTOMY  1982  . COLONOSCOPY N/A 07/14/2015   Procedure: COLONOSCOPY;  Surgeon: Ladene Artist, MD;  Location: [facility];  Service: Endoscopy;  Laterality: N/A;  . ESOPHAGOGASTRODUODENOSCOPY N/A 12/27/2013   Procedure: ESOPHAGOGASTRODUODENOSCOPY (EGD);  Surgeon: Shann Medal, MD;  Location: [facility];  Service: General;  Laterality: N/A;  . EYE SURGERY    . LAPAROSCOPIC GASTRIC SLEEVE RESECTION N/A 01/20/2014   Procedure: LAPAROSCOPIC GASTRIC SLEEVE RESECTION;  Surgeon: Shann Medal, MD;  Location: [facility];  Service: General;  Laterality: N/A;  . PAROTIDECTOMY  09/07/2012   Procedure: PAROTIDECTOMY;  Surgeon: Melida Quitter, MD;  Location: [facility];  Service: ENT;  Laterality: Left;  LEFT PAROTIDECTOMY  . TONSILLECTOMY  1968  . TUBAL LIGATION  yrs ago  . UPPER GI ENDOSCOPY  01/20/2014   Procedure: UPPER GI ENDOSCOPY;  Surgeon: Shann Medal, MD;  Location: [facility];  Service: General;;    OB History    Gravida Para  Term Preterm AB Living   3 3 3     3    SAB TAB Ectopic Multiple Live Births           3       Home Medications    Prior to Admission medications   Medication Sig Start Date End Date Taking? Authorizing Provider  aspirin EC 81 MG tablet Take 81 mg by mouth daily.    Historical Provider, MD  B-D INS SYR ULTRAFINE 1CC/31G 31G X 5/16" 1 ML MISC USE TO INJECT INSULIN TWICE A DAY AS INSTRUCTED 11/16/15   Chipper Herb, MD  Calcium Carbonate-Vitamin D (CALCIUM 600+D) 600-400 MG-UNIT per tablet Take 1 tablet by mouth 2 (two) times daily. 05/06/15   Cherre Robins, PharmD  cholecalciferol (VITAMIN D) 1000 UNITS tablet Take 5,000 Units by mouth every other day.  Historical Provider, MD  clopidogrel (PLAVIX) 75 MG tablet Take by mouth. 06/26/16 06/26/17  Historical Provider, MD  dicyclomine (BENTYL) 20 MG tablet Take 1 tablet (20 mg total) by mouth 2 (two) times daily as needed for spasms. 10/03/16   Julianne Rice, MD  glucose blood (ACCU-CHEK AVIVA PLUS) test strip CHECK BLOOD SUGAR UP TO TWICE DAILY OR AS INSTRUCTED-Dx E11.51 08/22/16   Chipper Herb, MD  Insulin Pen Needle 32G X 4 MM MISC Use to inject insulin with insulin pen 05/17/16   Chipper Herb, MD  losartan (COZAAR) 25 MG tablet Take 1 Tablet by mouth once daily 09/09/16   Chipper Herb, MD  meclizine (ANTIVERT) 12.5 MG tablet Take 1 tablet (12.5 mg total) by mouth 3 (three) times daily as needed for dizziness. 01/30/15   Chipper Herb, MD  metFORMIN (GLUCOPHAGE) 1000 MG tablet Take 1 tablet (1,000 mg total) by mouth 2 (two) times daily with a meal. 05/09/16   Cherre Robins, PharmD  metoprolol (LOPRESSOR) 50 MG tablet TAKE ONE-HALF TABLET BY MOUTH TWICE DAILY 09/09/16   Chipper Herb, MD  Multiple Vitamin (MULTIVITAMIN WITH MINERALS) TABS tablet Take 1 tablet by mouth 2 (two) times daily.    Historical Provider, MD  NOVOLIN 70/30 RELION (70-30) 100 UNIT/ML injection  07/13/16   Historical Provider, MD  ondansetron (ZOFRAN) 4 MG tablet Take 1  tablet (4 mg total) by mouth every 6 (six) hours as needed for nausea or vomiting. 10/03/16   Julianne Rice, MD  Pitavastatin Calcium (LIVALO) 4 MG TABS Take 1 tablet (4 mg total) by mouth daily. 05/09/16   Chipper Herb, MD  sodium chloride (OCEAN) 0.65 % SOLN nasal spray Place 1 spray into both nostrils as needed for congestion. 11/09/14   Domenic Polite, MD    Family History Family History  Problem Relation Age of Onset  . Heart failure Mother   . Hypertension Mother   . Skin cancer Mother   . Colitis Mother   . Diabetes Mother   . Kidney Stones Mother   . Stroke Mother   . Heart disease Father   . Colon polyps Father   . Diabetes Father   . Heart failure Father   . Heart attack Sister   . Diabetes Other   . Diabetes Daughter   . Stroke Maternal Grandfather   . Diabetes Maternal Grandfather   . Colon cancer Neg Hx     Social History Social History  Substance Use Topics  . Smoking status: Never Smoker  . Smokeless tobacco: Never Used  . Alcohol use 0.0 oz/week     Comment: rarely     Allergies   Atorvastatin; Crestor [rosuvastatin calcium]; Ezetimibe-simvastatin; Celebrex [celecoxib]; Levemir [insulin detemir]; and Penicillins   Review of Systems Review of Systems  Constitutional: Negative for chills and fever.  Respiratory: Negative for cough and shortness of breath.   Cardiovascular: Negative for chest pain, palpitations and leg swelling.  Gastrointestinal: Positive for abdominal pain, nausea and vomiting. Negative for blood in stool, constipation and diarrhea.  Genitourinary: Positive for flank pain. Negative for dysuria, frequency, hematuria and pelvic pain.  Musculoskeletal: Positive for back pain. Negative for arthralgias, myalgias, neck pain and neck stiffness.  Skin: Negative for rash and wound.  Neurological: Negative for dizziness, weakness, light-headedness, numbness and headaches.  All other systems reviewed and are negative.    Physical  Exam Updated Vital Signs BP 147/71   Pulse 93   Temp 98.1 F (36.7 C) (Oral)  Resp 16   Ht 5' 7.5" (1.715 m)   Wt 210 lb (95.3 kg)   SpO2 95%   BMI 32.41 kg/m   Physical Exam  Constitutional: She is oriented to person, place, and time. She appears well-developed and well-nourished.  HENT:  Head: Normocephalic and atraumatic.  Mouth/Throat: Oropharynx is clear and moist. No oropharyngeal exudate.  Eyes: EOM are normal. Pupils are equal, round, and reactive to light.  Neck: Normal range of motion. Neck supple.  Cardiovascular: Normal rate and regular rhythm.   Pulmonary/Chest: Effort normal and breath sounds normal. No respiratory distress. She has no wheezes. She has no rales. She exhibits no tenderness.  Abdominal: Soft. Bowel sounds are normal. There is tenderness (ight lower quadrant tenderness to palpation. No rebound or guaing.). There is no rebound and no guarding.  Musculoskeletal: Normal range of motion. She exhibits no edema or tenderness.  No CVA tenderness bilaterally. No lower extremity swelling, asymmetry or tenderness.  Neurological: She is alert and oriented to person, place, and time.  5/5 motor and alternatives. Sensation fully intact.  Skin: Skin is warm and dry. No rash noted. No erythema.  Psychiatric: She has a normal mood and affect. Her behavior is normal.  Nursing note and vitals reviewed.    ED Treatments / Results  DIAGNOSTIC STUDIES: Oxygen Saturation is 98% on RA, normal by my interpretation.   COORDINATION OF CARE: 5:35 PM-Discussed next steps with pt. Pt verbalized understanding and is agreeable with the plan.   Labs (all labs ordered are listed, but only abnormal results are displayed) Labs Reviewed  COMPREHENSIVE METABOLIC PANEL - Abnormal; Notable for the following:       Result Value   Glucose, Bld 158 (*)    BUN 28 (*)    Creatinine, Ser 1.38 (*)    GFR calc non Af Amer 39 (*)    GFR calc Af Amer 45 (*)    All other components  within normal limits  CBC - Abnormal; Notable for the following:    RBC 5.22 (*)    All other components within normal limits  CBG MONITORING, ED - Abnormal; Notable for the following:    Glucose-Capillary 168 (*)    All other components within normal limits  LIPASE, BLOOD  URINALYSIS, ROUTINE W REFLEX MICROSCOPIC    EKG  EKG Interpretation None       Radiology Ct Abdomen Pelvis W Contrast  Result Date: 10/03/2016 CLINICAL DATA:  66 y/o  F; 2 days of right lower quadrant pain. EXAM: CT ABDOMEN AND PELVIS WITH CONTRAST TECHNIQUE: Multidetector CT imaging of the abdomen and pelvis was performed using the standard protocol following bolus administration of intravenous contrast. CONTRAST:  7mL ISOVUE-300 IOPAMIDOL (ISOVUE-300) INJECTION 61% COMPARISON:  12/22/2011 CT abdomen and pelvis. FINDINGS: Lower chest: Mitral annular calcification. Small pericardial effusion. Normal heart size. Hepatobiliary: No focal liver abnormality is seen. Status post cholecystectomy. No biliary dilatation. Pancreas: Unremarkable. No pancreatic ductal dilatation or surrounding inflammatory changes. Spleen: Normal in size without focal abnormality. 17 mm splenule superior to left adrenal gland. Adrenals/Urinary Tract: Adrenal glands are unremarkable. Left kidney interpolar subcentimeter lucency, likely representing a cyst. Kidneys are otherwise normal, without renal calculi, focal lesion, or hydronephrosis. Bladder is unremarkable. Stomach/Bowel: Stomach is within normal limits. Appendix appears normal. No evidence of bowel wall thickening, distention, or inflammatory changes. 8 mm posteriorly directed diverticulum of second part of duodenum (series 6, image 54). Vascular/Lymphatic: Aortic atherosclerosis. No enlarged abdominal or pelvic lymph nodes. Reproductive: Uterus and  bilateral adnexa are unremarkable. Other: Soft visual nodules in the left lower quadrant subcutaneous fat probably represent injection sites. Large  volume of retroperitoneal and intraperitoneal fat again noted. Musculoskeletal: Multilevel degenerative changes of the spine greatest at the L5-S1 level. No acute osseous abnormality identified. Mild bilateral hip osteoarthrosis. IMPRESSION: 1. No acute process identified as explanation for pain. 2. Small unchanged pericardial effusion. 3. Aortic atherosclerosis. Electronically Signed   By: Kristine Garbe M.D.   On: 10/03/2016 19:53    Procedures Procedures (including critical care time)  Medications Ordered in ED Medications  sodium chloride 0.9 % bolus 500 mL (0 mLs Intravenous Stopped 10/03/16 1948)  iopamidol (ISOVUE-300) 61 % injection 80 mL (80 mLs Intravenous Contrast Given 10/03/16 1916)     Initial Impression / Assessment and Plan / ED Course  I have reviewed the triage vital signs and the nursing notes.  Pertinent labs & imaging results that were available during my care of the patient were reviewed by me and considered in my medical decision making (see chart for details).     CT abdomen without acute findings. Normal white blood cell count. Patient with improved abdominal pain in the emergency department and no vomiting. Tolerating liquids without difficulty. Advised to follow-up with her primary physician. Return precautions given. Final Clinical Impressions(s) / ED Diagnoses   Final diagnoses:  Abdominal pain, unspecified abdominal location  Nausea and vomiting in adult    New Prescriptions New Prescriptions   DICYCLOMINE (BENTYL) 20 MG TABLET    Take 1 tablet (20 mg total) by mouth 2 (two) times daily as needed for spasms.   ONDANSETRON (ZOFRAN) 4 MG TABLET    Take 1 tablet (4 mg total) by mouth every 6 (six) hours as needed for nausea or vomiting.   I personally performed the services described in this documentation, which was scribed in my presence. The recorded information has been reviewed and is accurate.       Julianne Rice, MD 10/03/16 [id number]

## 2016-10-03 NOTE — ED Triage Notes (Signed)
Pt reports RLQ pain radiating into her right flank x 1 day.  Vomiting yesterday.  LBM-yesterday.

## 2016-10-05 ENCOUNTER — Telehealth: Payer: Self-pay | Admitting: Family Medicine

## 2016-10-05 NOTE — Telephone Encounter (Signed)
appt scheduled for hospital follow up

## 2016-10-06 ENCOUNTER — Encounter: Payer: Self-pay | Admitting: Family Medicine

## 2016-10-06 ENCOUNTER — Ambulatory Visit (INDEPENDENT_AMBULATORY_CARE_PROVIDER_SITE_OTHER): Payer: Medicare Other | Admitting: Family Medicine

## 2016-10-06 VITALS — BP 130/71 | HR 78 | Temp 97.4°F | Ht 67.5 in | Wt 215.4 lb

## 2016-10-06 DIAGNOSIS — N179 Acute kidney failure, unspecified: Secondary | ICD-10-CM | POA: Diagnosis not present

## 2016-10-06 DIAGNOSIS — R109 Unspecified abdominal pain: Secondary | ICD-10-CM

## 2016-10-06 NOTE — Progress Notes (Signed)
BP 130/71   Pulse 78   Temp 97.4 F (36.3 C) (Oral)   Ht 5' 7.5" (1.715 m)   Wt 215 lb 6.4 oz (97.7 kg)   BMI 33.24 kg/m    Subjective:    Patient ID: [name], female    DOB: 1951/06/17, 66 y.o.   MRN: [id number]  HPI: [name] is a 66 y.o. female presenting on 10/06/2016 for Hospitalization Follow-up ([place], ED 2/19, abd pain)   HPI Right-sided abdominal pain Patient is coming in with right-sided abdominal pain that began around 2/18 and she presented to the emergency Department in [facility] on 10/03/2016. The abdominal pain initially was severe and associated with nausea and rated as 8 out of 10. She said it did radiate around to her right flank initially as well. In the emergency department she had a CT scan of the abdomen and a urinalysis which came back essentially normal and did not show any reason for her acute abdominal pain. She says that she has gotten similar abdominal pain previously but not as severe as she had at this time. She did say that she had some constipation about a week before the abdominal pain started but she thinks that it cleared up but she did have some associated pain with that. She denies any diarrhea or blood in her stool or hematuria or dysuria. She denies any pain in her abdomen anywhere else. Today the pain is 4 out of 10 and is on the right side but does not radiate to the flank. She did have nausea when it started but does not any further.  Relevant past medical, surgical, family and social history reviewed and updated as indicated. Interim medical history since our last visit reviewed. Allergies and medications reviewed and updated.  Review of Systems  Constitutional: Negative for chills and fever.  Respiratory: Negative for chest tightness and shortness of breath.   Cardiovascular: Negative for chest pain and leg swelling.  Gastrointestinal: Positive for abdominal pain, constipation and nausea. Negative for diarrhea and  vomiting.  Genitourinary: Negative for difficulty urinating and dysuria.  Musculoskeletal: Negative for back pain and gait problem.  Skin: Negative for rash.  Neurological: Negative for light-headedness and headaches.  Psychiatric/Behavioral: Negative for agitation and behavioral problems.  All other systems reviewed and are negative.   Per HPI unless specifically indicated above     Objective:    BP 130/71   Pulse 78   Temp 97.4 F (36.3 C) (Oral)   Ht 5' 7.5" (1.715 m)   Wt 215 lb 6.4 oz (97.7 kg)   BMI 33.24 kg/m   Wt Readings from Last 3 Encounters:  10/06/16 215 lb 6.4 oz (97.7 kg)  10/03/16 210 lb (95.3 kg)  07/25/16 217 lb (98.4 kg)    Physical Exam  Constitutional: She is oriented to person, place, and time. She appears well-developed and well-nourished. No distress.  Eyes: Conjunctivae are normal.  Cardiovascular: Normal rate, regular rhythm, normal heart sounds and intact distal pulses.   No murmur heard. Pulmonary/Chest: Effort normal and breath sounds normal. No respiratory distress. She has no wheezes. She has no rales.  Abdominal: Soft. Bowel sounds are normal. She exhibits no distension. There is tenderness (Right lateral abdominal pain, no CVA tenderness or pain anywhere else). There is no rebound and no guarding.  Musculoskeletal: Normal range of motion. She exhibits no edema or tenderness.  Neurological: She is alert and oriented to person, place, and time. Coordination normal.  Skin: Skin is warm and dry. No rash noted. She is not diaphoretic.  Psychiatric: She has a normal mood and affect. Her behavior is normal.  Nursing note and vitals reviewed.   Results for orders placed or performed during the hospital encounter of 10/03/16  Lipase, blood  Result Value Ref Range   Lipase 40 11 - 51 U/L  Comprehensive metabolic panel  Result Value Ref Range   Sodium 140 135 - 145 mmol/L   Potassium 4.1 3.5 - 5.1 mmol/L   Chloride 104 101 - 111 mmol/L   CO2 26  22 - 32 mmol/L   Glucose, Bld 158 (H) 65 - 99 mg/dL   BUN 28 (H) 6 - 20 mg/dL   Creatinine, Ser 1.38 (H) 0.44 - 1.00 mg/dL   Calcium 9.0 8.9 - 10.3 mg/dL   Total Protein 6.9 6.5 - 8.1 g/dL   Albumin 3.6 3.5 - 5.0 g/dL   AST 22 15 - 41 U/L   ALT 23 14 - 54 U/L   Alkaline Phosphatase 64 38 - 126 U/L   Total Bilirubin 0.7 0.3 - 1.2 mg/dL   GFR calc non Af Amer 39 (L) >60 mL/min   GFR calc Af Amer 45 (L) >60 mL/min   Anion gap 10 5 - 15  CBC  Result Value Ref Range   WBC 9.5 4.0 - 10.5 K/uL   RBC 5.22 (H) 3.87 - 5.11 MIL/uL   Hemoglobin 14.8 12.0 - 15.0 g/dL   HCT 44.3 36.0 - 46.0 %   MCV 84.9 78.0 - 100.0 fL   MCH 28.4 26.0 - 34.0 pg   MCHC 33.4 30.0 - 36.0 g/dL   RDW 13.5 11.5 - 15.5 %   Platelets 222 150 - 400 K/uL  Urinalysis, Routine w reflex microscopic  Result Value Ref Range   Color, Urine YELLOW YELLOW   APPearance CLEAR CLEAR   Specific Gravity, Urine 1.009 1.005 - 1.030   pH 5.0 5.0 - 8.0   Glucose, UA NEGATIVE NEGATIVE mg/dL   Hgb urine dipstick NEGATIVE NEGATIVE   Bilirubin Urine NEGATIVE NEGATIVE   Ketones, ur NEGATIVE NEGATIVE mg/dL   Protein, ur NEGATIVE NEGATIVE mg/dL   Nitrite NEGATIVE NEGATIVE   Leukocytes, UA NEGATIVE NEGATIVE  CBG monitoring, ED  Result Value Ref Range   Glucose-Capillary 168 (H) 65 - 99 mg/dL   Comment 1 Notify RN    Comment 2 Document in Chart       Assessment & Plan:   Problem List Items Addressed This Visit    None    Visit Diagnoses    Right lateral abdominal pain    -  Primary   Improved since ED visit, likely related to scar tissue and constipation, recommended MiraLAX as needed   Relevant Orders   CMP14+EGFR   Acute kidney injury ([place])       Relevant Orders   CMP14+EGFR       Follow up plan: Return if symptoms worsen or fail to improve.  Counseling provided for all of the vaccine components Orders Placed This Encounter  Procedures  . [place], MD [facility] 10/06/2016, 1:34 PM

## 2016-10-07 LAB — CMP14+EGFR
ALT: 21 IU/L (ref 0–32)
AST: 15 IU/L (ref 0–40)
Albumin/Globulin Ratio: 1.6 (ref 1.2–2.2)
Albumin: 4.1 g/dL (ref 3.6–4.8)
Alkaline Phosphatase: 74 IU/L (ref 39–117)
BUN/Creatinine Ratio: 21 (ref 12–28)
BUN: 25 mg/dL (ref 8–27)
Bilirubin Total: 0.4 mg/dL (ref 0.0–1.2)
CO2: 24 mmol/L (ref 18–29)
Calcium: 8.9 mg/dL (ref 8.7–10.3)
Chloride: 100 mmol/L (ref 96–106)
Creatinine, Ser: 1.18 mg/dL — ABNORMAL HIGH (ref 0.57–1.00)
GFR calc Af Amer: 56 — ABNORMAL LOW (ref >59)
GFR calc non Af Amer: 49 — ABNORMAL LOW (ref >59)
Globulin, Total: 2.5 (ref 1.5–4.5)
Glucose: 271 mg/dL — ABNORMAL HIGH (ref 65–99)
Potassium: 5 mmol/L (ref 3.5–5.2)
Sodium: 142 mmol/L (ref 134–144)
Total Protein: 6.6 g/dL (ref 6.0–8.5)

## 2016-10-20 DIAGNOSIS — Z903 Acquired absence of stomach [part of]: Secondary | ICD-10-CM | POA: Diagnosis not present

## 2016-10-20 DIAGNOSIS — Z9889 Other specified postprocedural states: Secondary | ICD-10-CM | POA: Diagnosis not present

## 2016-10-21 ENCOUNTER — Other Ambulatory Visit: Payer: Self-pay | Admitting: Family Medicine

## 2016-10-31 ENCOUNTER — Ambulatory Visit (INDEPENDENT_AMBULATORY_CARE_PROVIDER_SITE_OTHER): Payer: Medicare Other | Admitting: Family Medicine

## 2016-10-31 ENCOUNTER — Encounter: Payer: Self-pay | Admitting: Family Medicine

## 2016-10-31 VITALS — BP 130/67 | HR 86 | Temp 97.6°F | Ht 67.5 in | Wt 218.0 lb

## 2016-10-31 DIAGNOSIS — R300 Dysuria: Secondary | ICD-10-CM | POA: Diagnosis not present

## 2016-10-31 LAB — URINALYSIS, COMPLETE
Bilirubin, UA: NEGATIVE
Ketones, UA: NEGATIVE
Leukocytes, UA: NEGATIVE
Nitrite, UA: NEGATIVE
Specific Gravity, UA: 1.015 (ref 1.005–1.030)
Urobilinogen, Ur: 0.2 mg/dL (ref 0.2–1.0)
pH, UA: 5 (ref 5.0–7.5)

## 2016-10-31 LAB — MICROSCOPIC EXAMINATION
Epithelial Cells (non renal): >10 /hpf — AB (ref 0–10)
Renal Epithel, UA: NONE SEEN /hpf

## 2016-10-31 MED ORDER — FLUCONAZOLE 150 MG PO TABS: 150.0000 mg | ORAL_TABLET | Freq: Once | ORAL | 0 refills | Status: AC

## 2016-10-31 NOTE — Progress Notes (Signed)
Subjective:    Patient ID: [name], female    DOB: [birth year], 66 y.o.   MRN: [id number]  HPI Pt here today for possible UTI and vaginal irritation. Patient has been using over-the-counter Monistat for 2 days. She has had some frequency but she is a diabetic and sugars have typically run high. It is 3+ today and this could increase her urine frequency. She denies dysuria. She has been complaining of pain in her right lower quadrant for several weeks. She had abdominal CT with contrast which showed no pathology.   Patient Active Problem List   Diagnosis Date Noted  . Memory difficulty 11/26/2015  . Benign neoplasm of sigmoid colon 07/14/2015  . Low serum vitamin D 05/06/2015  . Vertigo 05/06/2015  . Aphasia 12/08/2014  . TIA (transient ischemic attack) 11/07/2014  . 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 CKD stage 3 due to type 2 diabetes mellitus ([place]) 06/03/2014  . Type II diabetes mellitus with renal manifestations ([place]) 06/03/2014  . Type II diabetes mellitus with peripheral circulatory disorder ([place]) 06/03/2014  . Obesity (BMI 30.0-34.9) 04/24/2014  . Dizziness and giddiness 04/14/2014  . 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 Osteopenia 10/09/2013  . Abnormal ankle brachial index 09/11/2013  . Hypomagnesemia 08/01/2013  . DYSPNEA 08/18/2010  . MYOCARDIAL PERFUSION SCAN, WITH STRESS TEST, ABNORMAL 08/04/2010  . ELECTROCARDIOGRAM, ABNORMAL 07/28/2010  . Edema extremities 12/30/2008  . Hyperlipidemia 12/26/2008  . Benign essential HTN 12/26/2008  . CAD 12/26/2008  . PERICARDIAL EFFUSION 12/26/2008  . GERD 12/26/2008  . OSTEOARTHRITIS 12/26/2008   Outpatient Encounter Prescriptions as of 10/31/2016  Medication Sig  . aspirin EC 81 MG tablet Take 81 mg by mouth daily.  . B-D INS SYR ULTRAFINE 1CC/31G 31G X 5/16" 1 ML MISC USE TO INJECT INSULIN TWICE A DAY AS INSTRUCTED  . Calcium Carbonate-Vitamin D (CALCIUM 600+D) 600-400 MG-UNIT per tablet Take 1  tablet by mouth 2 (two) times daily.  . cholecalciferol (VITAMIN D) 1000 UNITS tablet Take 5,000 Units by mouth every other day.   . clopidogrel (PLAVIX) 75 MG tablet Take 1 Tablet by mouth once daily  . glucose blood (ACCU-CHEK AVIVA PLUS) test strip CHECK BLOOD SUGAR UP TO TWICE DAILY OR AS INSTRUCTED-Dx E11.51  . Insulin Pen Needle 32G X 4 MM MISC Use to inject insulin with insulin pen  . losartan (COZAAR) 25 MG tablet Take 1 Tablet by mouth once daily  . meclizine (ANTIVERT) 12.5 MG tablet Take 1 tablet (12.5 mg total) by mouth 3 (three) times daily as needed for dizziness.  . metFORMIN (GLUCOPHAGE) 1000 MG tablet Take 1 tablet (1,000 mg total) by mouth 2 (two) times daily with a meal.  . metoprolol (LOPRESSOR) 50 MG tablet TAKE ONE-HALF TABLET BY MOUTH TWICE DAILY  . Multiple Vitamin (MULTIVITAMIN WITH MINERALS) TABS tablet Take 1 tablet by mouth 2 (two) times daily.  Marland Kitchen NOVOLIN 70/30 RELION (70-30) 100 UNIT/ML injection   . Pitavastatin Calcium (LIVALO) 4 MG TABS Take 1 tablet (4 mg total) by mouth daily.  . sodium chloride (OCEAN) 0.65 % SOLN nasal spray Place 1 spray into both nostrils as needed for congestion.  . [DISCONTINUED] dicyclomine (BENTYL) 20 MG tablet Take 1 tablet (20 mg total) by mouth 2 (two) times daily as needed for spasms.  . [DISCONTINUED] ondansetron (ZOFRAN) 4 MG tablet Take 1 tablet (4 mg total) by mouth every 6 (six) hours as needed for nausea or vomiting.   No facility-administered encounter medications  on file as of 10/31/2016.       Review of Systems  Constitutional: Negative.   HENT: Negative.   Eyes: Negative.   Respiratory: Negative.   Cardiovascular: Negative.   Gastrointestinal: Negative.   Endocrine: Negative.   Genitourinary: Positive for frequency and vaginal discharge (and irritation).  Musculoskeletal: Negative.   Skin: Negative.   Allergic/Immunologic: Negative.   Neurological: Negative.   Hematological: Negative.   Psychiatric/Behavioral:  Negative.        Objective:   Physical Exam  Constitutional: She is oriented to person, place, and time. She appears well-developed and well-nourished.  Pulmonary/Chest: Effort normal and breath sounds normal.  Abdominal: Soft. There is tenderness (right lower quadrant).  Neurological: She is alert and oriented to person, place, and time.  Psychiatric: She has a normal mood and affect. Her behavior is normal.   BP (!) 145/76 (BP Location: Left Arm)   Pulse 86   Temp 97.6 F (36.4 C) (Oral)   Ht 5' 7.5" (1.715 m)   Wt 218 lb (98.9 kg)   BMI 33.64 kg/m        Assessment & Plan:  1. Dysuria Urine has some blood cells as well as 3+ sugar. Will culture but looks like the problem is more related to [place] infection then urinary tract. - Urinalysis, Complete  Wardell Honour MD - Urine culture

## 2016-11-02 LAB — URINE CULTURE: Organism ID, Bacteria: NO GROWTH

## 2016-11-17 ENCOUNTER — Ambulatory Visit (INDEPENDENT_AMBULATORY_CARE_PROVIDER_SITE_OTHER): Payer: Medicare Other | Admitting: Family Medicine

## 2016-11-17 ENCOUNTER — Encounter: Payer: Self-pay | Admitting: Family Medicine

## 2016-11-17 VITALS — BP 158/70 | HR 91 | Temp 97.2°F | Ht 67.5 in | Wt 218.0 lb

## 2016-11-17 DIAGNOSIS — E1151 Type 2 diabetes mellitus with diabetic peripheral angiopathy without gangrene: Secondary | ICD-10-CM

## 2016-11-17 DIAGNOSIS — I10 Essential (primary) hypertension: Secondary | ICD-10-CM

## 2016-11-17 DIAGNOSIS — R7989 Other specified abnormal findings of blood chemistry: Secondary | ICD-10-CM | POA: Diagnosis not present

## 2016-11-17 DIAGNOSIS — E7800 Pure hypercholesterolemia, unspecified: Secondary | ICD-10-CM

## 2016-11-17 DIAGNOSIS — N183 Chronic kidney disease, stage 3 unspecified: Secondary | ICD-10-CM

## 2016-11-17 DIAGNOSIS — G629 Polyneuropathy, unspecified: Secondary | ICD-10-CM | POA: Diagnosis not present

## 2016-11-17 DIAGNOSIS — E1122 Type 2 diabetes mellitus with diabetic chronic kidney disease: Secondary | ICD-10-CM

## 2016-11-17 LAB — BAYER DCA HB A1C WAIVED: HB A1C (BAYER DCA - WAIVED): 12.6 % — ABNORMAL HIGH (ref <7.0)

## 2016-11-17 NOTE — Patient Instructions (Addendum)
Medicare Annual Wellness Visit  [facility] and the medical providers at [facility] strive to bring you the best medical care.  In doing so we not only want to address your current medical conditions and concerns but also to detect new conditions early and prevent illness, disease and health-related problems.    Medicare offers a yearly Wellness Visit which allows our clinical staff to assess your need for preventative services including immunizations, lifestyle education, counseling to decrease risk of preventable diseases and screening for fall risk and other medical concerns.    This visit is provided free of charge (no copay) for all Medicare recipients. The clinical pharmacists at [facility] have begun to conduct these Wellness Visits which will also include a thorough review of all your medications.    As you primary medical provider recommend that you make an appointment for your Annual Wellness Visit if you have not done so already this year.  You may set up this appointment before you leave today or you may call back ([phone]) and schedule an appointment.  Please make sure when you call that you mention that you are scheduling your Annual Wellness Visit with the clinical pharmacist so that the appointment may be made for the proper length of time.     Continue current medications. Continue good therapeutic lifestyle changes which include good diet and exercise. Fall precautions discussed with patient. If an FOBT was given today- please return it to our front desk. If you are over 30 years old - you may need Prevnar 72 or the adult Pneumonia vaccine.  **Flu shots are available--- please call and schedule a FLU-CLINIC appointment**  After your visit with [place] today you will receive a survey in the mail or online from [employer] regarding your care with [place]. Please take a moment to fill this out. Your feedback is very  important to [place] as you can help [place] better understand your patient needs as well as improve your experience and satisfaction. WE CARE ABOUT YOU!!!   Follow-up with endocrinology as planned Follow-up with cardiology once the visit dated is determined Continue to monitor blood sugars and blood pressures closely Come by here in 2-3 weeks and have nurse recheck blood pressure and bring readings from home Make sure that you take a copy of your blood work with you to see the endocrinologist

## 2016-11-17 NOTE — Progress Notes (Signed)
Subjective:    Patient ID: [name], female    DOB: [birth year], 66 y.o.   MRN: [id number]  HPI Pt here for follow up and management of chronic medical problems which includes diabetes, hyperlipidemia and hypertension. She is taking medication regularly.Patient today complains of some right sided abdominal pain and back pain. She is also concerned about her serum calcium. Her last serum calcium in February of this year was within normal limits at 8.9. The patient denies any chest pain or shortness of breath anymore than usual. She does normally see the cardiologist but has not heard from them and is not certain when the next visit with him is supposed to be. Shortness of breath that she has is mostly with activity and this is no more than usual. She continues to have this right sided abdominal pain which is associated with laying on her right side but also following certain foods that she has eaten. She denies any blood in the stool or black tarry bowel movements. She is passing her water frequently but having no burning. She does have an appointment with Dr. Chalmers Cater next week her endocrinologist. The patient's last colonoscopy was in November 2016 and she did have a polyp which was not cancerous so most likely her next colonoscopy is not due until 10 years from that time.    Patient Active Problem List   Diagnosis Date Noted  . Memory difficulty 11/26/2015  . Benign neoplasm of sigmoid colon 07/14/2015  . Low serum vitamin D 05/06/2015  . Vertigo 05/06/2015  . Aphasia 12/08/2014  . TIA (transient ischemic attack) 11/07/2014  . 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 CKD stage 3 due to type 2 diabetes mellitus ([place]) 06/03/2014  . Type II diabetes mellitus with renal manifestations ([place]) 06/03/2014  . Type II diabetes mellitus with peripheral circulatory disorder ([place]) 06/03/2014  . Obesity (BMI 30.0-34.9) 04/24/2014  . Dizziness and giddiness 04/14/2014  . History of sleeve gastrectomy,  01/20/2014 02/05/2014  . Osteopenia 10/09/2013  . Abnormal ankle brachial index 09/11/2013  . Hypomagnesemia 08/01/2013  . DYSPNEA 08/18/2010  . MYOCARDIAL PERFUSION SCAN, WITH STRESS TEST, ABNORMAL 08/04/2010  . ELECTROCARDIOGRAM, ABNORMAL 07/28/2010  . Edema extremities 12/30/2008  . Hyperlipidemia 12/26/2008  . Benign essential HTN 12/26/2008  . CAD 12/26/2008  . PERICARDIAL EFFUSION 12/26/2008  . GERD 12/26/2008  . OSTEOARTHRITIS 12/26/2008   Outpatient Encounter Prescriptions as of 11/17/2016  Medication Sig  . aspirin EC 81 MG tablet Take 81 mg by mouth daily.  . B-D INS SYR ULTRAFINE 1CC/31G 31G X 5/16" 1 ML MISC USE TO INJECT INSULIN TWICE A DAY AS INSTRUCTED  . Calcium Carbonate-Vitamin D (CALCIUM 600+D) 600-400 MG-UNIT per tablet Take 1 tablet by mouth 2 (two) times daily.  . cholecalciferol (VITAMIN D) 1000 UNITS tablet Take 5,000 Units by mouth every other day.   . clopidogrel (PLAVIX) 75 MG tablet Take 1 Tablet by mouth once daily  . glucose blood (ACCU-CHEK AVIVA PLUS) test strip CHECK BLOOD SUGAR UP TO TWICE DAILY OR AS INSTRUCTED-Dx E11.51  . Insulin Pen Needle 32G X 4 MM MISC Use to inject insulin with insulin pen  . losartan (COZAAR) 25 MG tablet Take 1 Tablet by mouth once daily  . meclizine (ANTIVERT) 12.5 MG tablet Take 1 tablet (12.5 mg total) by mouth 3 (three) times daily as needed for dizziness.  . metFORMIN (GLUCOPHAGE) 1000 MG tablet Take 1 tablet (1,000 mg total) by mouth 2 (two) times daily with a meal.  .  metoprolol (LOPRESSOR) 50 MG tablet TAKE ONE-HALF TABLET BY MOUTH TWICE DAILY  . Multiple Vitamin (MULTIVITAMIN WITH MINERALS) TABS tablet Take 1 tablet by mouth 2 (two) times daily.  Marland Kitchen NOVOLIN 70/30 RELION (70-30) 100 UNIT/ML injection   . Pitavastatin Calcium (LIVALO) 4 MG TABS Take 1 tablet (4 mg total) by mouth daily.  . sodium chloride (OCEAN) 0.65 % SOLN nasal spray Place 1 spray into both nostrils as needed for congestion.   No facility-administered  encounter medications on file as of 11/17/2016.       Review of Systems  Constitutional: Negative.   HENT: Negative.   Eyes: Negative.   Respiratory: Negative.   Cardiovascular: Negative.   Gastrointestinal: Negative.   Endocrine: Negative.   Genitourinary: Negative.   Musculoskeletal: Positive for back pain (right side abd and back pain).  Skin: Negative.   Allergic/Immunologic: Negative.   Neurological: Negative.   Hematological: Negative.   Psychiatric/Behavioral: Negative.        Objective:   Physical Exam  Constitutional: She is oriented to person, place, and time. She appears well-developed and well-nourished.  Pleasant and alert  HENT:  Head: Normocephalic and atraumatic.  Right Ear: External ear normal.  Left Ear: External ear normal.  Nose: Nose normal.  Mouth/Throat: No oropharyngeal exudate.  Eyes: Conjunctivae and EOM are normal. Pupils are equal, round, and reactive to light. Right eye exhibits no discharge. Left eye exhibits no discharge. No scleral icterus.  Neck: Normal range of motion. Neck supple. No thyromegaly present.  No bruits thyromegaly or anterior cervical adenopathy  Cardiovascular: Normal rate and regular rhythm.   Murmur heard. Distal pulses were difficult to palpate in either foot and in the groin area. The heart is regular at 72/m with a grade 2/6 systolic ejection murmur  Pulmonary/Chest: Effort normal and breath sounds normal. No respiratory distress. She has no wheezes. She has no rales. She exhibits no tenderness.  Abdominal: Soft. Bowel sounds are normal. She exhibits no mass. There is tenderness. There is no rebound and no guarding.  Abdominal obesity with right sided abdominal tenderness near the scar and below the scar for her gallbladder surgery. The discomfort she is having is most likely related to adhesions and scar tissue. The recent CT scan was reviewed and there were no abnormal findings on the abdominal CT.  Musculoskeletal: Normal  range of motion. She exhibits no edema.  Due to back pain. The patient has to move in a certain way to lay down and sit back up.  Lymphadenopathy:    She has no cervical adenopathy.  Neurological: She is alert and oriented to person, place, and time. She has normal reflexes. No cranial nerve deficit.  Skin: Skin is warm and dry. No rash noted.  Psychiatric: She has a normal mood and affect. Her behavior is normal. Judgment and thought content normal.  Nursing note and vitals reviewed.  BP (!) 163/79 (BP Location: Left Arm)   Pulse 91   Temp 97.2 F (36.2 C) (Oral)   Ht 5' 7.5" (1.715 m)   Wt 218 lb (98.9 kg)   BMI 33.64 kg/m    Repeat blood pressure was 158/70. It must be noted that the patient did not take her blood pressure this morning.     Assessment & Plan:  1. Pure hypercholesterolemia -Continue current treatment and aggressive therapeutic lifestyle changes pending results of lab work - CBC with Differential/Platelet - Lipid panel  2. Low serum vitamin D -Continue current treatment pending results of lab  work - CBC with Differential/Platelet - VITAMIN D 25 Hydroxy (Vit-D Deficiency, Fractures)  3. Type II diabetes mellitus with peripheral circulatory disorder [facility]) -Follow-up with endocrinologist as planned and make sure that patient takes copy of lab work to her with that visit. - CBC with Differential/Platelet - BMP8+EGFR - Bayer DCA Hb A1c Waived  4. Chronic kidney disease, stage 3 (moderate) -Continue to avoid NSAIDs and keep blood pressure and blood sugar under good control - CBC with Differential/Platelet - BMP8+EGFR  5. Benign essential HTN -The patient's blood pressure was elevated this morning. She did not take her blood pressure medicine. She understands in the future to always take her blood pressure medicine with water before coming for lab work. - CBC with Differential/Platelet - BMP8+EGFR - Hepatic function panel  6. CKD stage 3 due to type 2  diabetes mellitus ([place]) -The patient was reminded to avoid all NSAIDs and keep blood sugar and blood pressure under the best control possible  7. Neuropathy ([place]) -The patient does have ongoing back pain. Some of her neuropathy may be due to arthritic changes in her back and also due to her diabetes.  Patient Instructions                       Medicare Annual Wellness Visit  [facility] and the medical providers at [facility] strive to bring you the best medical care.  In doing so we not only want to address your current medical conditions and concerns but also to detect new conditions early and prevent illness, disease and health-related problems.    Medicare offers a yearly Wellness Visit which allows our clinical staff to assess your need for preventative services including immunizations, lifestyle education, counseling to decrease risk of preventable diseases and screening for fall risk and other medical concerns.    This visit is provided free of charge (no copay) for all Medicare recipients. The clinical pharmacists at [facility] have begun to conduct these Wellness Visits which will also include a thorough review of all your medications.    As you primary medical provider recommend that you make an appointment for your Annual Wellness Visit if you have not done so already this year.  You may set up this appointment before you leave today or you may call back ([phone]) and schedule an appointment.  Please make sure when you call that you mention that you are scheduling your Annual Wellness Visit with the clinical pharmacist so that the appointment may be made for the proper length of time.     Continue current medications. Continue good therapeutic lifestyle changes which include good diet and exercise. Fall precautions discussed with patient. If an FOBT was given today- please return it to our front desk. If you are over 70 years old - you  may need Prevnar 74 or the adult Pneumonia vaccine.  **Flu shots are available--- please call and schedule a FLU-CLINIC appointment**  After your visit with [place] today you will receive a survey in the mail or online from [employer] regarding your care with [place]. Please take a moment to fill this out. Your feedback is very important to [place] as you can help [place] better understand your patient needs as well as improve your experience and satisfaction. WE CARE ABOUT YOU!!!   Follow-up with endocrinology as planned Follow-up with cardiology once the visit dated is determined Continue to monitor blood sugars and blood pressures closely Come by here  in 2-3 weeks and have nurse recheck blood pressure and bring readings from home Make sure that you take a copy of your blood work with you to see the endocrinologist  Arrie Senate MD

## 2016-11-18 LAB — BMP8+EGFR
BUN/Creatinine Ratio: 28 (ref 12–28)
BUN: 26 mg/dL (ref 8–27)
CO2: 24 mmol/L (ref 18–29)
Calcium: 9.7 mg/dL (ref 8.7–10.3)
Chloride: 103 mmol/L (ref 96–106)
Creatinine, Ser: 0.92 mg/dL (ref 0.57–1.00)
GFR calc Af Amer: 76 mL/min/{1.73_m2} (ref >59)
GFR calc non Af Amer: 66 mL/min/{1.73_m2} (ref >59)
Glucose: 250 mg/dL — ABNORMAL HIGH (ref 65–99)
Potassium: 4.8 mmol/L (ref 3.5–5.2)
Sodium: 144 mmol/L (ref 134–144)

## 2016-11-18 LAB — CBC WITH DIFFERENTIAL/PLATELET
Basophils Absolute: 0.1 10*3/uL (ref 0.0–0.2)
Basos: 1 %
EOS (ABSOLUTE): 0.4 10*3/uL (ref 0.0–0.4)
Eos: 6 %
Hematocrit: 42.6 % (ref 34.0–46.6)
Hemoglobin: 14.2 g/dL (ref 11.1–15.9)
Immature Grans (Abs): 0 10*3/uL (ref 0.0–0.1)
Immature Granulocytes: 0 %
Lymphocytes Absolute: 2.4 10*3/uL (ref 0.7–3.1)
Lymphs: 32 %
MCH: 27.9 pg (ref 26.6–33.0)
MCHC: 33.3 g/dL (ref 31.5–35.7)
MCV: 84 fL (ref 79–97)
Monocytes Absolute: 0.5 10*3/uL (ref 0.1–0.9)
Monocytes: 6 %
Neutrophils Absolute: 4.2 10*3/uL (ref 1.4–7.0)
Neutrophils: 55 %
Platelets: 232 10*3/uL (ref 150–379)
RBC: 5.09 x10E6/uL (ref 3.77–5.28)
RDW: 14.1 % (ref 12.3–15.4)
WBC: 7.6 10*3/uL (ref 3.4–10.8)

## 2016-11-18 LAB — HEPATIC FUNCTION PANEL
ALT: 21 IU/L (ref 0–32)
AST: 15 IU/L (ref 0–40)
Albumin: 4 g/dL (ref 3.6–4.8)
Alkaline Phosphatase: 77 IU/L (ref 39–117)
Bilirubin Total: 0.6 mg/dL (ref 0.0–1.2)
Bilirubin, Direct: 0.16 mg/dL (ref 0.00–0.40)
Total Protein: 6.4 g/dL (ref 6.0–8.5)

## 2016-11-18 LAB — LIPID PANEL
Chol/HDL Ratio: 4.9 ratio — ABNORMAL HIGH (ref 0.0–4.4)
Cholesterol, Total: 238 mg/dL — ABNORMAL HIGH (ref 100–199)
HDL: 49 mg/dL (ref >39)
Triglycerides: 463 mg/dL — ABNORMAL HIGH (ref 0–149)

## 2016-11-18 LAB — VITAMIN D 25 HYDROXY (VIT D DEFICIENCY, FRACTURES): Vit D, 25-Hydroxy: 29.5 ng/mL — ABNORMAL LOW (ref 30.0–100.0)

## 2016-11-18 MED ORDER — FENOFIBRATE 145 MG PO TABS: 145.0000 mg | ORAL_TABLET | Freq: Every day | ORAL | 1 refills | Status: DC

## 2016-11-18 NOTE — Addendum Note (Signed)
Addended by: Thana Ates on: 11/18/2016 10:49 AM   Modules accepted: Orders

## 2016-11-24 ENCOUNTER — Other Ambulatory Visit: Payer: Self-pay | Admitting: Family Medicine

## 2016-11-28 ENCOUNTER — Telehealth: Payer: Self-pay | Admitting: *Deleted

## 2016-11-28 ENCOUNTER — Ambulatory Visit: Payer: Medicare Other | Admitting: Neurology

## 2016-11-28 NOTE — Telephone Encounter (Signed)
Called and spoke with patient. R/s appt from 11/28/16 to 12/01/16 at 430pm, check in 400pm. Advised office closed d/t power outage. Patient verbalized understanding and appreciation for call.

## 2016-11-29 NOTE — Telephone Encounter (Signed)
Called and spoke with pt. Reminded her of her appt 4/19 at 430pm with CW,MD. Check in 400pm.

## 2016-12-01 ENCOUNTER — Ambulatory Visit (INDEPENDENT_AMBULATORY_CARE_PROVIDER_SITE_OTHER): Payer: Medicare Other | Admitting: Neurology

## 2016-12-01 ENCOUNTER — Encounter: Payer: Self-pay | Admitting: Neurology

## 2016-12-01 VITALS — BP 172/83 | HR 98 | Ht 67.5 in | Wt 218.0 lb

## 2016-12-01 DIAGNOSIS — R413 Other amnesia: Secondary | ICD-10-CM | POA: Diagnosis not present

## 2016-12-01 DIAGNOSIS — G459 Transient cerebral ischemic attack, unspecified: Secondary | ICD-10-CM

## 2016-12-01 NOTE — Progress Notes (Signed)
Reason for visit: Memory disturbance  [name] is an 66 y.o. female  History of present illness:  [name] is a 66 year old right-handed white female with a history of diabetes, hypertension, and cerebrovascular disease. The patient has had several aphasia type events, she has done well since last seen without any recurrence. The patient is on aspirin and Plavix at this time. She continues to report some mild issues with memory and word finding. She has difficulty remembering names for people. She has not had much change in her memory since last seen. Her diabetes remains under poor control, her last hemoglobin A1c was 12.6. The patient will be seeing an endocrinologist. She does not exercise much, she claims that her a.m. glucose levels run around 250. She returns for an evaluation.  Past Medical History:  Diagnosis Date  . Abnormal Doppler ultrasound of carotid artery    a. 05/2013 - mild plaque, 1-39% BICA, abnormal appearrance of thyroid (TSH wnl in 06/2013).  . Allergy    seasonal  . CAD (coronary artery disease)    a. Nonobstructive by cath 2006 - 25% LAD, 25-30% after 1st diag, distal 25% PDA, minor irreg LCx. b. Normal nuc 2011.  . Cataract    surgery  . Chronic kidney disease    stage 3  . Cystocele   . Difficult intubation 09-07-2012   big neck trouble with intubation parotid surgery"takes little med to sedate"  . Dizziness and giddiness 04/14/2014  . Dyslipidemia   . Esophageal stricture   . Family history of anesthesia complication    daughter slow to awaken after teeth extraction at age 47  . Fatty liver   . Fracture, skull ([place])   . GERD (gastroesophageal reflux disease)   . Heart murmur   . Hiatal hernia    a. 07/2013: HH with small stricture holding up barium tablet (concides with patient's sx of food sticking).  . Hyperlipidemia   . Hypertension    dr Percival Spanish  . IBS (irritable bowel syndrome)   . 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 Memory difficulty 11/26/2015  . Morbid obesity ([place])   . Obesity   . Osteoarthritis   . Osteopenia   . Osteoporosis   . Palpitations    a. PVC/PAC on EKG 08/2013.  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  Marland Kitchen Pericardial effusion    a. Mod by echo 2012 at time of PNA. b. Echo 07/2012: small pericardial effusion vs fat.  Marland Kitchen PONV (postoperative nausea and vomiting)    n/v, and room spinning after cataract and parotid surgery  . Sleep apnea    CPAP currently broke, calling company about machine, pt does not know cpap settings, uses cpap off and on  . Stroke [facility]) 11-07-2014   tia  . Superficial phlebitis   . TIA (transient ischemic attack) 2016  . Toe ulcer ([place]) 01-08-14   Left big toe-being tx. at wound center "is healing"  . Varicose veins   . Vitamin D deficiency     Past Surgical History:  Procedure Laterality Date  . BREATH TEK H PYLORI N/A 07/29/2013   Procedure: BREATH TEK H PYLORI;  Surgeon: Shann Medal, MD;  Location: [facility];  Service: General;  Laterality: N/A;  . CARDIAC CATHETERIZATION     06  . CARPAL TUNNEL RELEASE  5 yrs ago   Left  . CATARACT EXTRACTION Bilateral yrs ago  .  CHOLECYSTECTOMY  1982  . COLONOSCOPY N/A 07/14/2015   Procedure: COLONOSCOPY;  Surgeon: Ladene Artist, MD;  Location: [facility];  Service: Endoscopy;  Laterality: N/A;  . ESOPHAGOGASTRODUODENOSCOPY N/A 12/27/2013   Procedure: ESOPHAGOGASTRODUODENOSCOPY (EGD);  Surgeon: Shann Medal, MD;  Location: [facility];  Service: General;  Laterality: N/A;  . EYE SURGERY    . LAPAROSCOPIC GASTRIC SLEEVE RESECTION N/A 01/20/2014   Procedure: LAPAROSCOPIC GASTRIC SLEEVE RESECTION;  Surgeon: Shann Medal, MD;  Location: [facility];  Service: General;  Laterality: N/A;  . PAROTIDECTOMY  09/07/2012   Procedure: PAROTIDECTOMY;  Surgeon: Melida Quitter, MD;  Location: [place];  Service: ENT;  Laterality: Left;  LEFT PAROTIDECTOMY  . TONSILLECTOMY  1968  . TUBAL LIGATION   yrs ago  . UPPER GI ENDOSCOPY  01/20/2014   Procedure: UPPER GI ENDOSCOPY;  Surgeon: Shann Medal, MD;  Location: [facility];  Service: General;;    Family History  Problem Relation Age of Onset  . Heart failure Mother   . Hypertension Mother   . Skin cancer Mother   . Colitis Mother   . Diabetes Mother   . Kidney Stones Mother   . Stroke Mother   . Heart disease Father   . Colon polyps Father   . Diabetes Father   . Heart failure Father   . Heart attack Sister   . Diabetes Other   . Diabetes Daughter   . Stroke Maternal Grandfather   . Diabetes Maternal Grandfather   . Colon cancer Neg Hx     Social history:  reports that she has never smoked. She has never used smokeless tobacco. She reports that she drinks alcohol. She reports that she uses drugs, including Other-see comments.    Allergies  Allergen Reactions  . Atorvastatin     Myalgias   . Crestor [Rosuvastatin Calcium] Other (See Comments)    Stiffness and back pain  . Ezetimibe-Simvastatin Other (See Comments)    Leg cramps  . Celebrex [Celecoxib] Rash  . Levemir [Insulin Detemir] Rash  . Penicillins Other (See Comments)    SYNCOPE (Tolerated Ancef) Has patient had a PCN reaction causing immediate rash, facial/tongue/throat swelling, SOB or lightheadedness with hypotension: Yes Has patient had a PCN reaction causing severe rash involving mucus membranes or skin necrosis: No Has patient had a PCN reaction that required hospitalization No Has patient had a PCN reaction occurring within the last 10 years: No If all of the above answers are "NO", then may proceed with Cephalosporin use.    Medications:  Prior to Admission medications   Medication Sig Start Date End Date Taking? Authorizing Provider  aspirin EC 81 MG tablet Take 81 mg by mouth daily.   Yes Historical Provider, MD  B-D INS SYR ULTRAFINE 1CC/31G 31G X 5/16" 1 ML MISC USE TO INJECT INSULIN TWICE A DAY AS INSTRUCTED 11/16/15  Yes Chipper Herb, MD   Calcium Carbonate-Vitamin D (CALCIUM 600+D) 600-400 MG-UNIT per tablet Take 1 tablet by mouth 2 (two) times daily. 05/06/15  Yes Cherre Robins, PharmD  cholecalciferol (VITAMIN D) 1000 UNITS tablet Take 5,000 Units by mouth every other day.    Yes Historical Provider, MD  clopidogrel (PLAVIX) 75 MG tablet Take 1 Tablet by mouth once daily 10/21/16  Yes Chipper Herb, MD  fenofibrate (TRICOR) 145 MG tablet Take 1 tablet (145 mg total) by mouth daily. 11/18/16  Yes Chipper Herb, MD  glucose blood (ACCU-CHEK AVIVA PLUS) test strip CHECK BLOOD SUGAR UP  TO TWICE DAILY OR AS INSTRUCTED-Dx E11.51 08/22/16  Yes Chipper Herb, MD  Insulin Pen Needle 32G X 4 MM MISC Use to inject insulin with insulin pen 05/17/16  Yes Chipper Herb, MD  losartan (COZAAR) 25 MG tablet Take 1 Tablet by mouth once daily 09/09/16  Yes Chipper Herb, MD  meclizine (ANTIVERT) 12.5 MG tablet Take 1 tablet (12.5 mg total) by mouth 3 (three) times daily as needed for dizziness. 01/30/15  Yes Chipper Herb, MD  metFORMIN (GLUCOPHAGE) 1000 MG tablet Take 1 tablet (1,000 mg total) by mouth 2 (two) times daily with a meal. 05/09/16  Yes Cherre Robins, PharmD  metFORMIN (GLUCOPHAGE) 1000 MG tablet Take 1 Tablet by mouth 2 times a day 11/24/16  Yes Chipper Herb, MD  metoprolol (LOPRESSOR) 50 MG tablet TAKE ONE-HALF TABLET BY MOUTH TWICE DAILY 09/09/16  Yes Chipper Herb, MD  Multiple Vitamin (MULTIVITAMIN WITH MINERALS) TABS tablet Take 1 tablet by mouth 2 (two) times daily.   Yes Historical Provider, MD  NOVOLIN 70/30 RELION (70-30) 100 UNIT/ML injection  07/13/16  Yes Historical Provider, MD  Pitavastatin Calcium (LIVALO) 4 MG TABS Take 1 tablet (4 mg total) by mouth daily. 05/09/16  Yes Chipper Herb, MD  sodium chloride (OCEAN) 0.65 % SOLN nasal spray Place 1 spray into both nostrils as needed for congestion. 11/09/14  Yes Domenic Polite, MD    ROS:  Out of a complete 14 system review of symptoms, the patient complains only of the  following symptoms, and all other reviewed systems are negative.  Hearing loss, ringing in the ears Eye pain Abdominal pain Incontinence of the bladder, frequency of urination Back pain, achy muscles, muscle cramps Moles Memory loss   Blood pressure (!) 172/83, pulse 98, height 5' 7.5" (1.715 m), weight 218 lb (98.9 kg).  Physical Exam  General: The patient is alert and cooperative at the time of the examination.The patient has significant central obesity.  Skin: No significant peripheral edema is noted.   Neurologic Exam  Mental status: The patient is alert and oriented x 3 at the time of the examination. The patient has apparent normal recent and remote memory, with an apparently normal attention span and concentration ability.Mini-Mental status examination done today shows a total score 29/30.   Cranial nerves: Facial symmetry is present. Speech is normal, no aphasia or dysarthria is noted. Extraocular movements are full. Visual fields are full.  Motor: The patient has good strength in all 4 extremities.  Sensory examination: Soft touch sensation is symmetric on the face, arms, and legs.  Coordination: The patient has good finger-nose-finger and heel-to-shin bilaterally.  Gait and station: The patient has a normal gait. Tandem gait is unsteady.  Romberg is negative. No drift is seen.  Reflexes: Deep tendon reflexes are symmetric, but are depressed.   Assessment/Plan:  1. Mild memory disturbance   2. Cerebrovascular disease, TIA events   The patient has not had any further episodes of aphasia. She has been on an aspirin and Plavix combination for greater than 90 days, she can likely drop the aspirin at this point. The patient has been encouraged to exercise and lose weight. She will be going to an endocrinologist to help with better control for her diabetes. We will follow the memory issues over time. She will follow-up in 6 months.    Jill Alexanders MD 12/01/2016  4:58 PM  [facility] [address] [place],  [address]  Phone  [phone] Fax [phone]

## 2016-12-25 ENCOUNTER — Other Ambulatory Visit: Payer: Self-pay | Admitting: Family Medicine

## 2016-12-27 DIAGNOSIS — E7800 Pure hypercholesterolemia, unspecified: Secondary | ICD-10-CM | POA: Diagnosis not present

## 2016-12-27 DIAGNOSIS — R809 Proteinuria, unspecified: Secondary | ICD-10-CM | POA: Diagnosis not present

## 2016-12-27 DIAGNOSIS — E1165 Type 2 diabetes mellitus with hyperglycemia: Secondary | ICD-10-CM | POA: Diagnosis not present

## 2016-12-27 DIAGNOSIS — E669 Obesity, unspecified: Secondary | ICD-10-CM | POA: Diagnosis not present

## 2017-01-03 ENCOUNTER — Emergency Department (HOSPITAL_COMMUNITY)
Admission: EM | Admit: 2017-01-03 | Discharge: 2017-01-03 | Disposition: A | Discharge status: Home / Self Care | Payer: Medicare Other | Attending: Emergency Medicine | Admitting: Emergency Medicine

## 2017-01-03 ENCOUNTER — Emergency Department (HOSPITAL_COMMUNITY): Payer: Medicare Other

## 2017-01-03 ENCOUNTER — Encounter (HOSPITAL_COMMUNITY): Payer: Self-pay

## 2017-01-03 DIAGNOSIS — Z7984 Long term (current) use of oral hypoglycemic drugs: Secondary | ICD-10-CM | POA: Insufficient documentation

## 2017-01-03 DIAGNOSIS — R319 Hematuria, unspecified: Secondary | ICD-10-CM | POA: Diagnosis not present

## 2017-01-03 DIAGNOSIS — Z8673 Personal history of transient ischemic attack (TIA), and cerebral infarction without residual deficits: Secondary | ICD-10-CM | POA: Diagnosis not present

## 2017-01-03 DIAGNOSIS — E1159 Type 2 diabetes mellitus with other circulatory complications: Secondary | ICD-10-CM | POA: Insufficient documentation

## 2017-01-03 DIAGNOSIS — Z7982 Long term (current) use of aspirin: Secondary | ICD-10-CM | POA: Diagnosis not present

## 2017-01-03 DIAGNOSIS — I129 Hypertensive chronic kidney disease with stage 1 through stage 4 chronic kidney disease, or unspecified chronic kidney disease: Secondary | ICD-10-CM | POA: Diagnosis not present

## 2017-01-03 DIAGNOSIS — E1122 Type 2 diabetes mellitus with diabetic chronic kidney disease: Secondary | ICD-10-CM | POA: Insufficient documentation

## 2017-01-03 DIAGNOSIS — R109 Unspecified abdominal pain: Secondary | ICD-10-CM

## 2017-01-03 DIAGNOSIS — Z79899 Other long term (current) drug therapy: Secondary | ICD-10-CM | POA: Insufficient documentation

## 2017-01-03 DIAGNOSIS — N183 Chronic kidney disease, stage 3 (moderate): Secondary | ICD-10-CM | POA: Insufficient documentation

## 2017-01-03 DIAGNOSIS — I2510 Atherosclerotic heart disease of native coronary artery without angina pectoris: Secondary | ICD-10-CM | POA: Diagnosis not present

## 2017-01-03 LAB — URINALYSIS, ROUTINE W REFLEX MICROSCOPIC
Bacteria, UA: NONE SEEN
Bilirubin Urine: NEGATIVE
Glucose, UA: >=500 mg/dL — AB
Ketones, ur: 20 mg/dL — AB
Leukocytes, UA: NEGATIVE
Nitrite: NEGATIVE
Protein, ur: 30 mg/dL — AB
Specific Gravity, Urine: 1.02 (ref 1.005–1.030)
pH: 5 (ref 5.0–8.0)

## 2017-01-03 LAB — CBC
HCT: 42.9 % (ref 36.0–46.0)
Hemoglobin: 14.5 g/dL (ref 12.0–15.0)
MCH: 28.7 pg (ref 26.0–34.0)
MCHC: 33.8 g/dL (ref 30.0–36.0)
MCV: 84.8 fL (ref 78.0–100.0)
Platelets: 238 10*3/uL (ref 150–400)
RBC: 5.06 MIL/uL (ref 3.87–5.11)
RDW: 13.6 % (ref 11.5–15.5)
WBC: 9 10*3/uL (ref 4.0–10.5)

## 2017-01-03 LAB — COMPREHENSIVE METABOLIC PANEL
ALT: 23 U/L (ref 14–54)
AST: 21 U/L (ref 15–41)
Albumin: 3.9 g/dL (ref 3.5–5.0)
Alkaline Phosphatase: 52 U/L (ref 38–126)
Anion gap: 14 (ref 5–15)
BUN: 28 mg/dL — ABNORMAL HIGH (ref 6–20)
CO2: 21 mmol/L — ABNORMAL LOW (ref 22–32)
Calcium: 9.6 mg/dL (ref 8.9–10.3)
Chloride: 102 mmol/L (ref 101–111)
Creatinine, Ser: 1.57 mg/dL — ABNORMAL HIGH (ref 0.44–1.00)
GFR calc Af Amer: 39 mL/min — ABNORMAL LOW (ref >60)
GFR calc non Af Amer: 33 mL/min — ABNORMAL LOW (ref >60)
Glucose, Bld: 437 mg/dL — ABNORMAL HIGH (ref 65–99)
Potassium: 4.6 mmol/L (ref 3.5–5.1)
Sodium: 137 mmol/L (ref 135–145)
Total Bilirubin: 1.2 mg/dL (ref 0.3–1.2)
Total Protein: 7 g/dL (ref 6.5–8.1)

## 2017-01-03 LAB — LIPASE, BLOOD: Lipase: 43 U/L (ref 11–51)

## 2017-01-03 MED ORDER — OXYCODONE-ACETAMINOPHEN 5-325 MG PO TABS: 1.0000 | ORAL_TABLET | ORAL | 0 refills | Status: DC | PRN

## 2017-01-03 MED ORDER — ONDANSETRON 4 MG PO TBDP: 4.0000 mg | ORAL_TABLET | Freq: Three times a day (TID) | ORAL | 0 refills | Status: DC | PRN

## 2017-01-03 MED ORDER — TAMSULOSIN HCL 0.4 MG PO CAPS: 0.4000 mg | ORAL_CAPSULE | Freq: Every day | ORAL | 0 refills | Status: DC

## 2017-01-03 MED ORDER — ONDANSETRON HCL 4 MG/2ML IJ SOLN
4.0000 mg | Freq: Once | INTRAMUSCULAR | Status: AC
  Administered 2017-01-03: 4 mg via INTRAVENOUS
  Filled 2017-01-03: qty 2

## 2017-01-03 MED ORDER — HYDROMORPHONE HCL 1 MG/ML IJ SOLN
0.5000 mg | Freq: Once | INTRAMUSCULAR | Status: AC
Start: 2017-01-03 — End: 2017-01-03
  Administered 2017-01-03: 0.5 mg via INTRAVENOUS
  Filled 2017-01-03: qty 1

## 2017-01-03 NOTE — ED Notes (Signed)
Pt. Educated on clean catch technique and wheeled to restroom in wheel chair.

## 2017-01-03 NOTE — ED Triage Notes (Signed)
Pt reports left side flank pain that began yesterday. Pt also reports nausea and vomiting. Pt had 50mg  of phenergan at home as well as 25mg  of tramadol. Pt appears pale in triage.

## 2017-01-03 NOTE — ED Provider Notes (Signed)
Care assumed from Quincy Carnes, PA-C with plan for CT renal stone scan pending. Patient with known history of CKD Stage III. She does have some elevation in BUN/Cr in today's labwork but is consistent with history.   Patient given anti-emetics and analgesics and is reasonably comfortable. She is waiting for CT renal stone scan to confirm kidney stone. If positive, plan to d/c home with medications and urology follow-up.    MDM:  CT Renal Scan study shows 3 mm kidney stone at the UV[facility].   Discussed results with patient. Patient reports her pain has improved at this time. No vomiting since last dose of zofran. Patient is stable for discharge at this time. Instructed patient to follow-up with referred Urology. Strict return precautions discussed. Patient expresses understanding and agreement to plan.    Diagnoses that have been ruled out:  None  Diagnoses that are still under consideration:  None  Final diagnoses:  Left flank pain  Hematuria, unspecified type       Alison Napoleon, PA-C 01/03/17 1618    Little, Wenda Overland, MD 01/04/17 [phone]

## 2017-01-03 NOTE — Discharge Instructions (Signed)
Take the prescribed medication as directed.  Do not drive while taking pain medication, can make you drowsy. Follow-up with urology-- can call to make a follow-up appt. Can also follow-up with your doctor in the interim. Return to the ED for new or worsening symptoms.

## 2017-01-03 NOTE — ED Provider Notes (Signed)
[facility] DEPT Provider Note   CSN: [id number] Arrival date & time: 01/03/17  1116     History   Chief Complaint Chief Complaint  Patient presents with  . Flank Pain  . Emesis    HPI Jeriyah P Mcelveen is a 66 y.o. female.  The history is provided by the patient and medical records.  Flank Pain   Emesis      66 year old female with history of chronic kidney disease stage III, dyslipidemia, coronary artery disease, seasonal allergies, GERD, hypertension, diabetes, osteoarthritis, history of vertigo, history of TIA, presenting to the ED for left flank pain.  Reports this began yesterday and has been worsening. States pain localized to the left flank and left lateral abdomen. States pain is a mix of dull, aching pain, pressure, and sharp stabbing pains. States she is finding it hard to get a comfortable position. She reports associated nausea and vomiting. She's not had any fever or chills. No difficulty urinating. She denies any history of kidney stones.  Prior abdominal surgeries include tubal ligation, cholecystectomy, and gastric sleeve resection.  Past Medical History:  Diagnosis Date  . Abnormal Doppler ultrasound of carotid artery    a. 05/2013 - mild plaque, 1-39% BICA, abnormal appearrance of thyroid (TSH wnl in 06/2013).  . Allergy    seasonal  . CAD (coronary artery disease)    a. Nonobstructive by cath 2006 - 25% LAD, 25-30% after 1st diag, distal 25% PDA, minor irreg LCx. b. Normal nuc 2011.  . Cataract    surgery  . Chronic kidney disease    stage 3  . Cystocele   . Difficult intubation 09-07-2012   big neck trouble with intubation parotid surgery"takes little med to sedate"  . Dizziness and giddiness 04/14/2014  . Dyslipidemia   . Esophageal stricture   . Family history of anesthesia complication    daughter slow to awaken after teeth extraction at age 31  . Fatty liver   . Fracture, skull ([place])   . GERD (gastroesophageal reflux disease)   . Heart murmur    . Hiatal hernia    a. 07/2013: HH with small stricture holding up barium tablet (concides with patient's sx of food sticking).  . Hyperlipidemia   . Hypertension    dr Percival Spanish  . IBS (irritable bowel syndrome)   . IDDM (insulin dependent diabetes mellitus) ([place])    type 2   . Memory difficulty 11/26/2015  . Morbid obesity ([place])   . Obesity   . Osteoarthritis   . Osteopenia   . Osteoporosis   . Palpitations    a. PVC/PAC on EKG 08/2013.  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  Marland Kitchen Pericardial effusion    a. Mod by echo 2012 at time of PNA. b. Echo 07/2012: small pericardial effusion vs fat.  Marland Kitchen PONV (postoperative nausea and vomiting)    n/v, and room spinning after cataract and parotid surgery  . Sleep apnea    CPAP currently broke, calling company about machine, pt does not know cpap settings, uses cpap off and on  . Stroke [facility]) 11-07-2014   tia  . Superficial phlebitis   . TIA (transient ischemic attack) 2016  . Toe ulcer ([place]) 01-08-14   Left big toe-being tx. at wound center "is healing"  . Varicose veins   . Vitamin D deficiency     Patient Active Problem List   Diagnosis Date Noted  . Memory difficulty 11/26/2015  . Benign neoplasm of  sigmoid colon 07/14/2015  . Low serum vitamin D 05/06/2015  . Vertigo 05/06/2015  . Aphasia 12/08/2014  . TIA (transient ischemic attack) 11/07/2014  . 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 CKD stage 3 due to type 2 diabetes mellitus ([place]) 06/03/2014  . Type II diabetes mellitus with renal manifestations ([place]) 06/03/2014  . Type II diabetes mellitus with peripheral circulatory disorder ([place]) 06/03/2014  . Obesity (BMI 30.0-34.9) 04/24/2014  . Dizziness and giddiness 04/14/2014  . 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 Osteopenia 10/09/2013  . Abnormal ankle brachial index 09/11/2013  . Hypomagnesemia 08/01/2013  . DYSPNEA 08/18/2010  . MYOCARDIAL PERFUSION SCAN, WITH STRESS TEST, ABNORMAL  08/04/2010  . ELECTROCARDIOGRAM, ABNORMAL 07/28/2010  . Edema extremities 12/30/2008  . Hyperlipidemia 12/26/2008  . Benign essential HTN 12/26/2008  . CAD 12/26/2008  . PERICARDIAL EFFUSION 12/26/2008  . GERD 12/26/2008  . OSTEOARTHRITIS 12/26/2008    Past Surgical History:  Procedure Laterality Date  . BREATH TEK H PYLORI N/A 07/29/2013   Procedure: BREATH TEK H PYLORI;  Surgeon: Shann Medal, MD;  Location: [facility];  Service: General;  Laterality: N/A;  . CARDIAC CATHETERIZATION     06  . CARPAL TUNNEL RELEASE  5 yrs ago   Left  . CATARACT EXTRACTION Bilateral yrs ago  . CHOLECYSTECTOMY  1982  . COLONOSCOPY N/A 07/14/2015   Procedure: COLONOSCOPY;  Surgeon: Ladene Artist, MD;  Location: [facility];  Service: Endoscopy;  Laterality: N/A;  . ESOPHAGOGASTRODUODENOSCOPY N/A 12/27/2013   Procedure: ESOPHAGOGASTRODUODENOSCOPY (EGD);  Surgeon: Shann Medal, MD;  Location: [facility];  Service: General;  Laterality: N/A;  . EYE SURGERY    . LAPAROSCOPIC GASTRIC SLEEVE RESECTION N/A 01/20/2014   Procedure: LAPAROSCOPIC GASTRIC SLEEVE RESECTION;  Surgeon: Shann Medal, MD;  Location: [facility];  Service: General;  Laterality: N/A;  . PAROTIDECTOMY  09/07/2012   Procedure: PAROTIDECTOMY;  Surgeon: Melida Quitter, MD;  Location: [facility];  Service: ENT;  Laterality: Left;  LEFT PAROTIDECTOMY  . TONSILLECTOMY  1968  . TUBAL LIGATION  yrs ago  . UPPER GI ENDOSCOPY  01/20/2014   Procedure: UPPER GI ENDOSCOPY;  Surgeon: Shann Medal, MD;  [facility]: [facility];  Service: General;;    OB History    Gravida Para Term Preterm AB Living   3 3 3     3    SAB TAB Ectopic Multiple Live Births           3       Home Medications    Prior to Admission medications   Medication Sig Start Date End Date Taking? Authorizing Provider  aspirin EC 81 MG tablet Take 81 mg by mouth daily.    [provider]  B-D INS SYR ULTRAFINE 1CC/31G 31G X 5/16" 1 ML MISC USE TO INJECT INSULIN TWICE A  DAY AS INSTRUCTED 11/16/15   Chipper Herb, MD  Calcium Carbonate-Vitamin D (CALCIUM 600+D) 600-400 MG-UNIT per tablet Take 1 tablet by mouth 2 (two) times daily. 05/06/15   Cherre Robins, PharmD  cholecalciferol (VITAMIN D) 1000 UNITS tablet Take 5,000 Units by mouth every other day.     [provider]  clopidogrel (PLAVIX) 75 MG tablet Take 1 Tablet by mouth once daily 12/26/16   Chipper Herb, MD  fenofibrate (TRICOR) 145 MG tablet Take 1 tablet (145 mg total) by mouth daily. 11/18/16   Chipper Herb, MD  glucose blood (ACCU-CHEK AVIVA PLUS) test strip CHECK BLOOD SUGAR UP TO  TWICE DAILY OR AS INSTRUCTED-Dx E11.51 08/22/16   Chipper Herb, MD  Insulin Pen Needle 32G X 4 MM MISC Use to inject insulin with insulin pen 05/17/16   Chipper Herb, MD  losartan (COZAAR) 25 MG tablet Take 1 Tablet by mouth once daily 09/09/16   Chipper Herb, MD  meclizine (ANTIVERT) 12.5 MG tablet Take 1 tablet (12.5 mg total) by mouth 3 (three) times daily as needed for dizziness. 01/30/15   Chipper Herb, MD  metFORMIN (GLUCOPHAGE) 1000 MG tablet Take 1 tablet (1,000 mg total) by mouth 2 (two) times daily with a meal. 05/09/16   Cherre Robins, PharmD  metFORMIN (GLUCOPHAGE) 1000 MG tablet Take 1 Tablet by mouth 2 times a day 11/24/16   Chipper Herb, MD  metoprolol (LOPRESSOR) 50 MG tablet TAKE ONE-HALF TABLET BY MOUTH TWICE DAILY 09/09/16   Chipper Herb, MD  Multiple Vitamin (MULTIVITAMIN WITH MINERALS) TABS tablet Take 1 tablet by mouth 2 (two) times daily.    [provider]  NOVOLIN 70/30 RELION (70-30) 100 UNIT/ML injection  07/13/16   [provider]  Pitavastatin Calcium (LIVALO) 4 MG TABS Take 1 tablet (4 mg total) by mouth daily. 05/09/16   Chipper Herb, MD  sodium chloride (OCEAN) 0.65 % SOLN nasal spray Place 1 spray into both nostrils as needed for congestion. 11/09/14   Domenic Polite, MD    Family History Family History  Problem Relation Age of Onset  . Heart  failure Mother   . Hypertension Mother   . Skin cancer Mother   . Colitis Mother   . Diabetes Mother   . Kidney Stones Mother   . Stroke Mother   . Heart disease Father   . Colon polyps Father   . Diabetes Father   . Heart failure Father   . Heart attack Sister   . Diabetes Other   . Diabetes Daughter   . Stroke Maternal Grandfather   . Diabetes Maternal Grandfather   . Colon cancer Neg Hx     Social History Social History  Substance Use Topics  . Smoking status: Never Smoker  . Smokeless tobacco: Never Used  . Alcohol use 0.0 oz/week     Comment: rarely     Allergies   Atorvastatin; Crestor [rosuvastatin calcium]; Ezetimibe-simvastatin; Celebrex [celecoxib]; Levemir [insulin detemir]; and Penicillins   Review of Systems Review of Systems  Gastrointestinal: Positive for nausea and vomiting.  Genitourinary: Positive for flank pain.  All other systems reviewed and are negative.    Physical Exam Updated Vital Signs BP (!) 169/72 (BP Location: Left Arm)   Pulse 96   Temp 97.2 F (36.2 C) (Oral)   Resp 18   Ht 5\' 7"  (1.702 m)   Wt 98 kg (216 lb)   SpO2 96%   BMI 33.83 kg/m   Physical Exam  Constitutional: She is oriented to person, place, and time. She appears well-developed and well-nourished.  HENT:  Head: Normocephalic and atraumatic.  Mouth/Throat: Oropharynx is clear and moist.  Eyes: Conjunctivae and EOM are normal. Pupils are equal, round, and reactive to light.  Neck: Normal range of motion.  Cardiovascular: Normal rate, regular rhythm and normal heart sounds.   Pulmonary/Chest: Effort normal and breath sounds normal. No respiratory distress. She has no wheezes.  Abdominal: Soft. Bowel sounds are normal. There is no tenderness. There is CVA tenderness (left). There is no rebound.    Left CVA and left lateral abdominal tenderness; no rebound or  guarding  Musculoskeletal: Normal range of motion.  Neurological: She is alert and oriented to person,  place, and time.  Skin: Skin is warm and dry.  Psychiatric: She has a normal mood and affect.  Nursing note and vitals reviewed.    ED Treatments / Results  Labs (all labs ordered are listed, but only abnormal results are displayed) Labs Reviewed  COMPREHENSIVE METABOLIC PANEL - Abnormal; Notable for the following:       Result Value   CO2 21 (*)    Glucose, Bld 437 (*)    BUN 28 (*)    Creatinine, Ser 1.57 (*)    GFR calc non Af Amer 33 (*)    GFR calc Af Amer 39 (*)    All other components within normal limits  URINALYSIS, ROUTINE W REFLEX MICROSCOPIC - Abnormal; Notable for the following:    Color, Urine STRAW (*)    Glucose, UA >=500 (*)    Hgb urine dipstick MODERATE (*)    Ketones, ur 20 (*)    Protein, ur 30 (*)    Squamous Epithelial / LPF 0-5 (*)    All other components within normal limits  LIPASE, BLOOD  CBC    EKG  EKG Interpretation None       Radiology No results found.  Procedures Procedures (including critical care time)  Medications Ordered in ED Medications  ondansetron (ZOFRAN) injection 4 mg (not administered)  HYDROmorphone (DILAUDID) injection 0.5 mg (not administered)     Initial Impression / Assessment and Plan / ED Course  I have reviewed the triage vital signs and the nursing notes.  Pertinent labs & imaging results that were available during my care of the patient were reviewed by me and considered in my medical decision making (see chart for details).  66 year old female here with left flank pain. Began yesterday, has been persistent. Reports associated nausea and vomiting and difficulty finding a comfortable position. She is afebrile and nontoxic in appearance here. Patient has left CVA tenderness as well as left lateral abdominal tenderness. Screening lab work overall reassuring, serum creatinine consistent with her known CKD stage III.  Not far from baseline compared with prior.  UA blood noted.  No signs of infection.  Concern  for kidney stone as etiology of symptoms.  Will plan for CT renal study.  3:26 PM CT renal study delayed.  Care signed out to oncoming team.  Will review results and likely discharge to follow-up with urology.  Final Clinical Impressions(s) / ED Diagnoses   Final diagnoses:  Left flank pain  Hematuria, unspecified type    New Prescriptions New Prescriptions   ONDANSETRON (ZOFRAN ODT) 4 MG DISINTEGRATING TABLET    Take 1 tablet (4 mg total) by mouth every 8 (eight) hours as needed for nausea.   OXYCODONE-ACETAMINOPHEN (PERCOCET) 5-325 MG TABLET    Take 1 tablet by mouth every 4 (four) hours as needed.   TAMSULOSIN (FLOMAX) 0.4 MG CAPS CAPSULE    Take 1 capsule (0.4 mg total) by mouth daily after supper.     Larene Pickett, PA-C 01/03/17 1527    Little, Wenda Overland, MD 01/04/17 [phone]

## 2017-02-12 ENCOUNTER — Encounter: Payer: Self-pay | Admitting: Cardiology

## 2017-02-12 NOTE — Progress Notes (Signed)
HPI The patient presents for evaluation of aortic stenosis.  Since I last saw her she was at [facility] with a TIA.  I reviewed these records for this visit.    She had an echo with moderate AS.  She does not report residual issues with the TIA.  The patient denies any new symptoms such as chest discomfort, neck or arm discomfort. There has been no new shortness of breath, PND or orthopnea. There have been no reported palpitations, presyncope or syncope.    Allergies  Allergen Reactions  . Atorvastatin     Myalgias   . Crestor [Rosuvastatin Calcium] Other (See Comments)    Stiffness and back pain  . Ezetimibe-Simvastatin Other (See Comments)    Leg cramps  . Celebrex [Celecoxib] Rash  . Levemir [Insulin Detemir] Rash  . Penicillins Other (See Comments)    SYNCOPE (Tolerated Ancef) Has patient had a PCN reaction causing immediate rash, facial/tongue/throat swelling, SOB or lightheadedness with hypotension: Yes Has patient had a PCN reaction causing severe rash involving mucus membranes or skin necrosis: No Has patient had a PCN reaction that required hospitalization No Has patient had a PCN reaction occurring within the last 10 years: No If all of the above answers are "NO", then may proceed with Cephalosporin use.    Current Outpatient Prescriptions  Medication Sig Dispense Refill  . B-D INS SYR ULTRAFINE 1CC/31G 31G X 5/16" 1 ML MISC USE TO INJECT INSULIN TWICE A DAY AS INSTRUCTED 100 each 2  . Calcium Carbonate-Vitamin D (CALCIUM 600+D) 600-400 MG-UNIT per tablet Take 1 tablet by mouth 2 (two) times daily.    . cholecalciferol (VITAMIN D) 1000 UNITS tablet Take 5,000 Units by mouth every other day.     . clopidogrel (PLAVIX) 75 MG tablet Take 1 Tablet by mouth once daily 30 tablet 2  . fenofibrate (TRICOR) 145 MG tablet Take 1 tablet (145 mg total) by mouth daily. 90 tablet 1  . glucose blood (ACCU-CHEK AVIVA PLUS) test strip CHECK BLOOD SUGAR UP TO TWICE DAILY OR AS INSTRUCTED-Dx  E11.51 100 each 5  . Insulin Pen Needle 32G X 4 MM MISC Use to inject insulin with insulin pen 100 each 4  . losartan (COZAAR) 25 MG tablet Take 1 Tablet by mouth once daily 90 tablet 0  . meclizine (ANTIVERT) 12.5 MG tablet Take 1 tablet (12.5 mg total) by mouth 3 (three) times daily as needed for dizziness. 30 tablet 1  . metFORMIN (GLUCOPHAGE) 1000 MG tablet Take 1 tablet (1,000 mg total) by mouth 2 (two) times daily with a meal. 180 tablet 1  . metoprolol (LOPRESSOR) 50 MG tablet TAKE ONE-HALF TABLET BY MOUTH TWICE DAILY 90 tablet 0  . Multiple Vitamin (MULTIVITAMIN WITH MINERALS) TABS tablet Take 1 tablet by mouth 2 (two) times daily.    Marland Kitchen NOVOLIN 70/30 RELION (70-30) 100 UNIT/ML injection     . ondansetron (ZOFRAN ODT) 4 MG disintegrating tablet Take 1 tablet (4 mg total) by mouth every 8 (eight) hours as needed for nausea. 10 tablet 0  . Pitavastatin Calcium (LIVALO) 4 MG TABS Take 1 tablet (4 mg total) by mouth daily. 90 tablet 2  . sodium chloride (OCEAN) 0.65 % SOLN nasal spray Place 1 spray into both nostrils as needed for congestion. 15 mL 0  . tamsulosin (FLOMAX) 0.4 MG CAPS capsule Take 1 capsule (0.4 mg total) by mouth daily after supper. 10 capsule 0   No current facility-administered medications for this visit.  Past Medical History:  Diagnosis Date  . Abnormal Doppler ultrasound of carotid artery    a. 05/2013 - mild plaque, 1-39% BICA, abnormal appearrance of thyroid (TSH wnl in 06/2013).  Marland Kitchen CAD (coronary artery disease)    a. Nonobstructive by cath 2006 - 25% LAD, 25-30% after 1st diag, distal 25% PDA, minor irreg LCx. b. Normal nuc 2011.  . Cataract    surgery  . Chronic kidney disease    stage 3  . Cystocele   . Difficult intubation 09-07-2012   big neck trouble with intubation parotid surgery"takes little med to sedate"  . Dyslipidemia   . Esophageal stricture   . GERD (gastroesophageal reflux disease)   . Hiatal hernia    a. 07/2013: HH with small stricture  holding up barium tablet (concides with patient's sx of food sticking).  . Hyperlipidemia   . Hypertension    dr Percival Spanish  . IBS (irritable bowel syndrome)   . IDDM (insulin dependent diabetes mellitus) ([place])    type 2   . Memory difficulty 11/26/2015  . Morbid obesity ([place])   . Osteoarthritis   . Osteopenia   . Osteoporosis   . Palpitations    a. PVC/PAC on EKG 08/2013.  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  Marland Kitchen Pericardial effusion    a. Mod by echo 2012 at time of PNA. b. Echo 07/2012: small pericardial effusion vs fat.  Marland Kitchen PONV (postoperative nausea and vomiting)    n/v, and room spinning after cataract and parotid surgery  . Sleep apnea    CPAP currently broke, calling company about machine, pt does not know cpap settings, uses cpap off and on  . Stroke [facility]) 11-07-2014   tia  . Superficial phlebitis   . TIA (transient ischemic attack) 2016  . Toe ulcer ([place]) 01-08-14   Left big toe-being tx. at wound center "is healing"    Past Surgical History:  Procedure Laterality Date  . BREATH TEK H PYLORI N/A 07/29/2013   Procedure: BREATH TEK H PYLORI;  Surgeon: Shann Medal, MD;  Location: [facility];  Service: General;  Laterality: N/A;  . CARDIAC CATHETERIZATION     06  . CARPAL TUNNEL RELEASE  5 yrs ago   Left  . CATARACT EXTRACTION Bilateral yrs ago  . CHOLECYSTECTOMY  1982  . COLONOSCOPY N/A 07/14/2015   Procedure: COLONOSCOPY;  Surgeon: Ladene Artist, MD;  [facility];  Service: Endoscopy;  Laterality: N/A;  . ESOPHAGOGASTRODUODENOSCOPY N/A 12/27/2013   Procedure: ESOPHAGOGASTRODUODENOSCOPY (EGD);  Surgeon: Shann Medal, MD;  Location: [facility];  Service: General;  Laterality: N/A;  . EYE SURGERY    . LAPAROSCOPIC GASTRIC SLEEVE RESECTION N/A 01/20/2014   Procedure: LAPAROSCOPIC GASTRIC SLEEVE RESECTION;  Surgeon: Shann Medal, MD;  [facility];  Service: General;  Laterality: N/A;  . PAROTIDECTOMY  09/07/2012   Procedure:  PAROTIDECTOMY;  Surgeon: Melida Quitter, MD;  Location: [facility];  Service: ENT;  Laterality: Left;  LEFT PAROTIDECTOMY  . TONSILLECTOMY  1968  . TUBAL LIGATION  yrs ago  . UPPER GI ENDOSCOPY  01/20/2014   Procedure: UPPER GI ENDOSCOPY;  Surgeon: Shann Medal, MD;  [facility];  Service: General;;    ROS:  As stated in the HPI and negative for all other systems.    PHYSICAL EXAM BP 134/68 (BP Location: Left Arm, Patient Position: Sitting, Cuff Size: Normal)   Pulse 77   Ht 5\' 7"  (1.702 m)  Wt 215 lb (97.5 kg)   BMI 33.67 kg/m   GENERAL:  Well appearing NECK:  No jugular venous distention, waveform within normal limits, carotid upstroke brisk and symmetric, no bruits, no thyromegaly LUNGS:  Clear to auscultation bilaterally BACK:  No CVA tenderness CHEST:  Unremarkable HEART:  PMI not displaced or sustained,S1 and S2 within normal limits, no S3, no S4, no clicks, no rubs, 3/6 apical systolic murmur radiating out the aortic outflow tract, no diastolic murmurs ABD:  Flat, positive bowel sounds normal in frequency in pitch, no bruits, no rebound, no guarding, no midline pulsatile mass, no hepatomegaly, no splenomegaly EXT:  2 plus pulses upper and  decreased DP/PT bilateral, no edema, no cyanosis no clubbing   EKG:    Sinus rhythm, rate 77, axis within normal limits, intervals within normal limits, lateral nonspecific T-wave changes unchanged.  02/13/2017   ASSESSMENT AND PLAN  AORTIC STENOSIS - I did review the echo report from [place].  I will repeat an echo in Nov of this year as it will be one year since the last.  Her AS remains moderate.   HYPERTENSION -  The blood pressure is at target. No change in medications is indicated. We will continue with therapeutic lifestyle changes (TLC).  DM - Her A1C on lab review was 12.6.  She is now seeing Dr. Chalmers Cater.    Obesity -  She lost 60 lbs.  However, she has gained 10 lbs back.  We talked about this.  She is actually thinking of  starting the Atkins diet.   CAROTID STENOSIS - I don't see a recent Doppler even in Care Everywhere.  However, I will differ to Dr. Stephens November who follows her.   PERICARDIAL EFFUSION - There was no evidence of this on echo last year.    PALPITATIONS - She has been on beta blocker for this.  However, if Dr. Chalmers Cater thinks she needs to stop this for any reason for diabetes management this could be stopped.  I talked to the patient about this.

## 2017-02-13 ENCOUNTER — Encounter: Payer: Self-pay | Admitting: Cardiology

## 2017-02-13 ENCOUNTER — Ambulatory Visit (INDEPENDENT_AMBULATORY_CARE_PROVIDER_SITE_OTHER): Payer: Medicare Other | Admitting: Cardiology

## 2017-02-13 VITALS — BP 134/68 | HR 77 | Ht 67.0 in | Wt 215.0 lb

## 2017-02-13 DIAGNOSIS — E118 Type 2 diabetes mellitus with unspecified complications: Secondary | ICD-10-CM

## 2017-02-13 DIAGNOSIS — I10 Essential (primary) hypertension: Secondary | ICD-10-CM

## 2017-02-13 DIAGNOSIS — Z794 Long term (current) use of insulin: Secondary | ICD-10-CM

## 2017-02-13 DIAGNOSIS — R002 Palpitations: Secondary | ICD-10-CM

## 2017-02-13 DIAGNOSIS — I350 Nonrheumatic aortic (valve) stenosis: Secondary | ICD-10-CM | POA: Diagnosis not present

## 2017-02-13 NOTE — Patient Instructions (Signed)
Your physician has requested that you have an echocardiogram in [facility] Echocardiography is a painless test that uses sound waves to create images of your heart. It provides your doctor with information about the size and shape of your heart and how well your heart's chambers and valves are working. This procedure takes approximately one hour. There are no restrictions for this procedure.  Your physician wants you to follow-up in: 1 year or sooner if needed. You will receive a reminder letter in the mail two months in advance. If you don't receive a letter, please call our office to schedule the follow-up appointment.  If you need a refill on your cardiac medications before your next appointment, please call your pharmacy.

## 2017-02-24 DIAGNOSIS — E1142 Type 2 diabetes mellitus with diabetic polyneuropathy: Secondary | ICD-10-CM | POA: Diagnosis not present

## 2017-02-24 DIAGNOSIS — M24572 Contracture, left ankle: Secondary | ICD-10-CM | POA: Diagnosis not present

## 2017-02-24 DIAGNOSIS — B351 Tinea unguium: Secondary | ICD-10-CM | POA: Diagnosis not present

## 2017-02-24 DIAGNOSIS — M24571 Contracture, right ankle: Secondary | ICD-10-CM | POA: Diagnosis not present

## 2017-02-24 DIAGNOSIS — I10 Essential (primary) hypertension: Secondary | ICD-10-CM | POA: Diagnosis not present

## 2017-02-24 DIAGNOSIS — M205X1 Other deformities of toe(s) (acquired), right foot: Secondary | ICD-10-CM | POA: Diagnosis not present

## 2017-02-24 DIAGNOSIS — M205X2 Other deformities of toe(s) (acquired), left foot: Secondary | ICD-10-CM | POA: Diagnosis not present

## 2017-03-17 ENCOUNTER — Other Ambulatory Visit: Payer: Self-pay | Admitting: Family Medicine

## 2017-03-20 DIAGNOSIS — N201 Calculus of ureter: Secondary | ICD-10-CM | POA: Diagnosis not present

## 2017-03-21 ENCOUNTER — Other Ambulatory Visit: Payer: Self-pay | Admitting: Family Medicine

## 2017-03-24 ENCOUNTER — Other Ambulatory Visit: Payer: Self-pay | Admitting: Family Medicine

## 2017-04-06 ENCOUNTER — Encounter: Payer: Self-pay | Admitting: Family Medicine

## 2017-04-06 ENCOUNTER — Ambulatory Visit (INDEPENDENT_AMBULATORY_CARE_PROVIDER_SITE_OTHER): Payer: Medicare Other | Admitting: Family Medicine

## 2017-04-06 VITALS — BP 134/70 | HR 94 | Temp 97.8°F | Ht 67.0 in | Wt 214.0 lb

## 2017-04-06 DIAGNOSIS — N200 Calculus of kidney: Secondary | ICD-10-CM | POA: Diagnosis not present

## 2017-04-06 DIAGNOSIS — R7989 Other specified abnormal findings of blood chemistry: Secondary | ICD-10-CM

## 2017-04-06 DIAGNOSIS — I10 Essential (primary) hypertension: Secondary | ICD-10-CM

## 2017-04-06 DIAGNOSIS — Z903 Acquired absence of stomach [part of]: Secondary | ICD-10-CM | POA: Diagnosis not present

## 2017-04-06 DIAGNOSIS — I739 Peripheral vascular disease, unspecified: Secondary | ICD-10-CM | POA: Diagnosis not present

## 2017-04-06 DIAGNOSIS — K580 Irritable bowel syndrome with diarrhea: Secondary | ICD-10-CM | POA: Diagnosis not present

## 2017-04-06 DIAGNOSIS — E669 Obesity, unspecified: Secondary | ICD-10-CM | POA: Diagnosis not present

## 2017-04-06 DIAGNOSIS — E1151 Type 2 diabetes mellitus with diabetic peripheral angiopathy without gangrene: Secondary | ICD-10-CM | POA: Diagnosis not present

## 2017-04-06 DIAGNOSIS — E7800 Pure hypercholesterolemia, unspecified: Secondary | ICD-10-CM | POA: Diagnosis not present

## 2017-04-06 DIAGNOSIS — N183 Chronic kidney disease, stage 3 unspecified: Secondary | ICD-10-CM

## 2017-04-06 DIAGNOSIS — E1122 Type 2 diabetes mellitus with diabetic chronic kidney disease: Secondary | ICD-10-CM | POA: Diagnosis not present

## 2017-04-06 LAB — MICROSCOPIC EXAMINATION
Epithelial Cells (non renal): >10 /hpf — AB (ref 0–10)
Renal Epithel, UA: NONE SEEN /hpf

## 2017-04-06 LAB — URINALYSIS, COMPLETE
Bilirubin, UA: NEGATIVE
Leukocytes, UA: NEGATIVE
Nitrite, UA: NEGATIVE
RBC, UA: NEGATIVE
Specific Gravity, UA: 1.02 (ref 1.005–1.030)
Urobilinogen, Ur: 0.2 mg/dL (ref 0.2–1.0)
pH, UA: 5.5 (ref 5.0–7.5)

## 2017-04-06 LAB — BAYER DCA HB A1C WAIVED: HB A1C (BAYER DCA - WAIVED): 11.7 % — ABNORMAL HIGH (ref <7.0)

## 2017-04-06 NOTE — Progress Notes (Signed)
Subjective:    Patient ID: [name], female    DOB: 03-Jul-1951, 66 y.o.   MRN: [id number]  HPI Pt here for follow up and management of chronic medical problems which includes diabetes, hyperlipidemia and hypertension. She is taking medication regularly.The vital signs today are stable and her weight today is 214 pounds with a good blood pressure. She is diabetic. She is complaining with some back pain feet and knee pain. She also points out she had a recent kidney stone. The patient is seeing the endocrinologist, Dr. York Spaniel because she is always had trouble with good blood sugar control. She 5 see her again next month. She did bring some readings in and the sugars are running between 160 and 260 fasting. He denies any chest pain and no more shortness of breath than usual. She denies any PND. She does have occasional vomiting unrelated to nausea and thinks that this is secondary to overeating with her gastric sleeve gastrectomy. This is worse in the morning. She does have occasional loose bowel movements and this is gone on for years. She denies any blood in the stool heartburn indigestion. She is passing her water without problems.  Patient Active Problem List   Diagnosis Date Noted  . Nonrheumatic aortic valve stenosis 02/13/2017  . Palpitations 02/13/2017  . Memory difficulty 11/26/2015  . Benign neoplasm of sigmoid colon 07/14/2015  . Low serum vitamin D 05/06/2015  . Vertigo 05/06/2015  . Aphasia 12/08/2014  . TIA (transient ischemic attack) 11/07/2014  . 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 CKD stage 3 due to type 2 diabetes mellitus ([place]) 06/03/2014  . Type II diabetes mellitus with renal manifestations ([place]) 06/03/2014  . Type II diabetes mellitus with peripheral circulatory disorder ([place]) 06/03/2014  . Obesity (BMI 30.0-34.9) 04/24/2014  . Dizziness and giddiness 04/14/2014  . 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 Osteopenia 10/09/2013  . Abnormal ankle  brachial index 09/11/2013  . Hypomagnesemia 08/01/2013  . DYSPNEA 08/18/2010  . MYOCARDIAL PERFUSION SCAN, WITH STRESS TEST, ABNORMAL 08/04/2010  . ELECTROCARDIOGRAM, ABNORMAL 07/28/2010  . Edema extremities 12/30/2008  . Hyperlipidemia 12/26/2008  . Benign essential HTN 12/26/2008  . CAD 12/26/2008  . PERICARDIAL EFFUSION 12/26/2008  . GERD 12/26/2008  . OSTEOARTHRITIS 12/26/2008   Outpatient Encounter Prescriptions as of 04/06/2017  Medication Sig  . B-D INS SYR ULTRAFINE 1CC/31G 31G X 5/16" 1 ML MISC USE TO INJECT INSULIN TWICE A DAY AS INSTRUCTED  . Calcium Carbonate-Vitamin D (CALCIUM 600+D) 600-400 MG-UNIT per tablet Take 1 tablet by mouth 2 (two) times daily.  . cholecalciferol (VITAMIN D) 1000 UNITS tablet Take 5,000 Units by mouth every other day.   . clopidogrel (PLAVIX) 75 MG tablet Take 1 Tablet by mouth once daily  . fenofibrate (TRICOR) 145 MG tablet Take 1 tablet (145 mg total) by mouth daily.  Marland Kitchen glucose blood (ACCU-CHEK AVIVA PLUS) test strip CHECK BLOOD SUGAR UP TO TWICE DAILY OR AS INSTRUCTED-Dx E11.51  . Insulin Pen Needle 32G X 4 MM MISC Use to inject insulin with insulin pen  . LIVALO 4 MG TABS Take 1 Tablet by mouth once daily  . losartan (COZAAR) 25 MG tablet Take 1 Tablet by mouth once daily  . meclizine (ANTIVERT) 12.5 MG tablet Take 1 tablet (12.5 mg total) by mouth 3 (three) times daily as needed for dizziness.  . metFORMIN (GLUCOPHAGE) 1000 MG tablet Take 1 tablet (1,000 mg total) by mouth 2 (two) times daily with a meal.  .  Multiple Vitamin (MULTIVITAMIN WITH MINERALS) TABS tablet Take 1 tablet by mouth 2 (two) times daily.  Marland Kitchen NOVOLIN 70/30 RELION (70-30) 100 UNIT/ML injection   . ondansetron (ZOFRAN ODT) 4 MG disintegrating tablet Take 1 tablet (4 mg total) by mouth every 8 (eight) hours as needed for nausea.  . sodium chloride (OCEAN) 0.65 % SOLN nasal spray Place 1 spray into both nostrils as needed for congestion.  . tamsulosin (FLOMAX) 0.4 MG CAPS  capsule Take 1 capsule (0.4 mg total) by mouth daily after supper.  . [DISCONTINUED] metoprolol tartrate (LOPRESSOR) 50 MG tablet TAKE ONE-HALF TABLET BY MOUTH TWICE DAILY   No facility-administered encounter medications on file as of 04/06/2017.       Review of Systems  Constitutional: Negative.   HENT: Negative.   Eyes: Negative.   Respiratory: Negative.   Cardiovascular: Negative.   Gastrointestinal: Negative.   Endocrine: Negative.   Genitourinary: Negative.        Recent kidney stone  Musculoskeletal: Positive for arthralgias (feet and knee pain) and back pain.  Skin: Negative.   Allergic/Immunologic: Negative.   Neurological: Negative.   Hematological: Negative.   Psychiatric/Behavioral: Negative.        Objective:   Physical Exam  Constitutional: She is oriented to person, place, and time. She appears well-developed and well-nourished. No distress.  The patient is pleasant and calm and relaxed. She says that she stays busy keeping up of all her appointments with specialists.  HENT:  Head: Normocephalic and atraumatic.  Right Ear: External ear normal.  Left Ear: External ear normal.  Nose: Nose normal.  Mouth/Throat: Oropharynx is clear and moist. No oropharyngeal exudate.  Minimal ears cerumen bilaterally with normal eardrums.  Eyes: Pupils are equal, round, and reactive to light. Conjunctivae and EOM are normal. Right eye exhibits no discharge. Left eye exhibits no discharge. No scleral icterus.  Neck: Normal range of motion. Neck supple. No thyromegaly present.  No bruits thyromegaly or anterior cervical adenopathy  Cardiovascular: Normal rate, regular rhythm and normal heart sounds.   No murmur heard. Distal pulses were difficult to palpate. I barely felt a dorsalis pedis on the right and no pulses on the left and no posterior tibial on the right. Grade 2/6 systolic ejection murmur with a rate at 84/m  Pulmonary/Chest: Effort normal and breath sounds normal. No  respiratory distress. She has no wheezes. She has no rales.  Clear anteriorly and posteriorly  Abdominal: Soft. Bowel sounds are normal. She exhibits no mass. There is no tenderness. There is no rebound and no guarding.  A protuberant abdomen with no abnormal masses palpable. The liver or spleen were not palpable. There is no tenderness.  Musculoskeletal: She exhibits no edema or tenderness.  The patient continues to have problems with sitting down on the table and getting up from the table with her back. This is been going on for a good while.  Lymphadenopathy:    She has no cervical adenopathy.  Neurological: She is alert and oriented to person, place, and time. She has normal reflexes. No cranial nerve deficit.  Skin: Skin is warm and dry. No rash noted.  A skin lesion that the patient was concerned with appears to be a seborrheic keratosis on her back.  Psychiatric: She has a normal mood and affect. Her behavior is normal. Judgment and thought content normal.  Nursing note and vitals reviewed.  BP 134/70 (BP Location: Left Arm)   Pulse 94   Temp 97.8 F (36.6 C) (Oral)  Ht 5' 7"  (1.702 m)   Wt 214 lb (97.1 kg)   BMI 33.52 kg/m         Assessment & Plan:  1. Low serum vitamin D -Continue current treatment pending results of lab work - CBC with Differential/Platelet - VITAMIN D 25 Hydroxy (Vit-D Deficiency, Fractures)  2. Pure hypercholesterolemia -Continue with current treatment and as aggressive therapeutic lifestyle changes as possible pending results of lab work - Lipid panel - CBC with Differential/Platelet  3. Type II diabetes mellitus with peripheral circulatory disorder (HCC) -Follow-up with Dr. Chalmers Cater as planned and continue with aggressive therapeutic lifestyle changes - BMP8+EGFR - CBC with Differential/Platelet - Microalbumin / creatinine urine ratio - Bayer DCA Hb A1c Waived  4. Chronic kidney disease, stage 3 (moderate) -Watch salt intake and avoid all  NSAIDs - BMP8+EGFR - CBC with Differential/Platelet - Urinalysis, Complete  5. Benign essential HTN -The blood pressure is good today and she will continue with current treatment - BMP8+EGFR - CBC with Differential/Platelet - Hepatic function panel  6. CKD stage 3 due to type 2 diabetes mellitus ([place]) -Continue to keep blood pressure under good control watch sodium intake and keep blood sugar under good control - BMP8+EGFR - CBC with Differential/Platelet  7. Obesity, unspecified classification, unspecified obesity type, unspecified whether serious comorbidity present -Make every effort to lose as much weight as possible - CBC with Differential/Platelet - TSH  8. Peripheral artery disease ([place]) -The patient describes no claudication symptoms.  9. 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  10. Kidney stone - Urinalysis, Complete -Follow-up with urology  11. Irritable bowel syndrome with diarrhea -Try a probiotic and occasional sugar-free yogurt  Patient Instructions                       Medicare Annual Wellness Visit  [facility] and the medical providers at [facility] strive to bring you the best medical care.  In doing so we not only want to address your current medical conditions and concerns but also to detect new conditions early and prevent illness, disease and health-related problems.    Medicare offers a yearly Wellness Visit which allows our clinical staff to assess your need for preventative services including immunizations, lifestyle education, counseling to decrease risk of preventable diseases and screening for fall risk and other medical concerns.    This visit is provided free of charge (no copay) for all Medicare recipients. The clinical pharmacists at [facility] have begun to conduct these Wellness Visits which will also include a thorough review of all your  medications.    As you primary medical provider recommend that you make an appointment for your Annual Wellness Visit if you have not done so already this year.  You may set up this appointment before you leave today or you may call back ([phone]) and schedule an appointment.  Please make sure when you call that you mention that you are scheduling your Annual Wellness Visit with the clinical pharmacist so that the appointment may be made for the proper length of time.     Continue current medications. Continue good therapeutic lifestyle changes which include good diet and exercise. Fall precautions discussed with patient. If an FOBT was given today- please return it to our front desk. If you are over 22 years old - you may need Prevnar 45 or the adult Pneumonia vaccine.  **Flu shots are available--- please call  and schedule a FLU-CLINIC appointment**  After your visit with [place] today you will receive a survey in the mail or online from [employer] regarding your care with [place]. Please take a moment to fill this out. Your feedback is very important to [place] as you can help [place] better understand your patient needs as well as improve your experience and satisfaction. WE CARE ABOUT YOU!!!   The patient should continue to follow-up with the endocrinologist and take her blood sugars to each visit there. She should try to get as much exercise as possible and make a special effort to drink more water and less carbonated beverages and avoid caffeine in the diet as much as possible as this can aggravate her loose bowel movements. Once again, she should drink plenty of water She should make every effort to reduce sugar bread in the diet. She should try some align, which is a probiotic and take one daily. The generic version of this can be purchased at [facility] under the equate brand and she would take one daily of this. She should continue to follow-up with the neurologist and the cardiologist and the urologist when  needed. We will make sure that we send a copy of the blood work to her endocrinologist and to these other people.     Arrie Senate MD

## 2017-04-06 NOTE — Patient Instructions (Addendum)
Medicare Annual Wellness Visit  [facility] and the medical providers at [facility] strive to bring you the best medical care.  In doing so we not only want to address your current medical conditions and concerns but also to detect new conditions early and prevent illness, disease and health-related problems.    Medicare offers a yearly Wellness Visit which allows our clinical staff to assess your need for preventative services including immunizations, lifestyle education, counseling to decrease risk of preventable diseases and screening for fall risk and other medical concerns.    This visit is provided free of charge (no copay) for all Medicare recipients. The clinical pharmacists at [facility] have begun to conduct these Wellness Visits which will also include a thorough review of all your medications.    As you primary medical provider recommend that you make an appointment for your Annual Wellness Visit if you have not done so already this year.  You may set up this appointment before you leave today or you may call back ([phone]) and schedule an appointment.  Please make sure when you call that you mention that you are scheduling your Annual Wellness Visit with the clinical pharmacist so that the appointment may be made for the proper length of time.     Continue current medications. Continue good therapeutic lifestyle changes which include good diet and exercise. Fall precautions discussed with patient. If an FOBT was given today- please return it to our front desk. If you are over 65 years old - you may need Prevnar 76 or the adult Pneumonia vaccine.  **Flu shots are available--- please call and schedule a FLU-CLINIC appointment**  After your visit with [place] today you will receive a survey in the mail or online from [employer] regarding your care with [place]. Please take a moment to fill this out. Your feedback is very  important to [place] as you can help [place] better understand your patient needs as well as improve your experience and satisfaction. WE CARE ABOUT YOU!!!   The patient should continue to follow-up with the endocrinologist and take her blood sugars to each visit there. She should try to get as much exercise as possible and make a special effort to drink more water and less carbonated beverages and avoid caffeine in the diet as much as possible as this can aggravate her loose bowel movements. Once again, she should drink plenty of water She should make every effort to reduce sugar bread in the diet. She should try some align, which is a probiotic and take one daily. The generic version of this can be purchased at [facility] under the equate brand and she would take one daily of this. She should continue to follow-up with the neurologist and the cardiologist and the urologist when needed. We will make sure that we send a copy of the blood work to her endocrinologist and to these other people.

## 2017-04-07 ENCOUNTER — Other Ambulatory Visit: Payer: Self-pay | Admitting: *Deleted

## 2017-04-07 ENCOUNTER — Telehealth: Payer: Self-pay | Admitting: Family Medicine

## 2017-04-07 LAB — HEPATIC FUNCTION PANEL
ALT: 17 IU/L (ref 0–32)
AST: 15 IU/L (ref 0–40)
Albumin: 4.1 g/dL (ref 3.6–4.8)
Alkaline Phosphatase: 51 IU/L (ref 39–117)
Bilirubin Total: 0.4 mg/dL (ref 0.0–1.2)
Bilirubin, Direct: 0.15 mg/dL (ref 0.00–0.40)
Total Protein: 6.2 g/dL (ref 6.0–8.5)

## 2017-04-07 LAB — BMP8+EGFR
BUN/Creatinine Ratio: 22 (ref 12–28)
BUN: 29 mg/dL — ABNORMAL HIGH (ref 8–27)
CO2: 24 mmol/L (ref 20–29)
Calcium: 9.7 mg/dL (ref 8.7–10.3)
Chloride: 102 mmol/L (ref 96–106)
Creatinine, Ser: 1.32 mg/dL — ABNORMAL HIGH (ref 0.57–1.00)
GFR calc Af Amer: 48 mL/min/{1.73_m2} — ABNORMAL LOW (ref >59)
GFR calc non Af Amer: 42 mL/min/{1.73_m2} — ABNORMAL LOW (ref >59)
Glucose: 242 mg/dL — ABNORMAL HIGH (ref 65–99)
Potassium: 4.6 mmol/L (ref 3.5–5.2)
Sodium: 142 mmol/L (ref 134–144)

## 2017-04-07 LAB — CBC WITH DIFFERENTIAL/PLATELET
Basophils Absolute: 0.1 10*3/uL (ref 0.0–0.2)
Basos: 1 %
EOS (ABSOLUTE): 0.4 10*3/uL (ref 0.0–0.4)
Eos: 5 %
Hematocrit: 39.9 % (ref 34.0–46.6)
Hemoglobin: 13.2 g/dL (ref 11.1–15.9)
Immature Grans (Abs): 0 10*3/uL (ref 0.0–0.1)
Immature Granulocytes: 0 %
Lymphocytes Absolute: 2.5 10*3/uL (ref 0.7–3.1)
Lymphs: 31 %
MCH: 28.1 pg (ref 26.6–33.0)
MCHC: 33.1 g/dL (ref 31.5–35.7)
MCV: 85 fL (ref 79–97)
Monocytes Absolute: 0.6 10*3/uL (ref 0.1–0.9)
Monocytes: 7 %
Neutrophils Absolute: 4.5 10*3/uL (ref 1.4–7.0)
Neutrophils: 56 %
Platelets: 251 10*3/uL (ref 150–379)
RBC: 4.7 x10E6/uL (ref 3.77–5.28)
RDW: 13.3 % (ref 12.3–15.4)
WBC: 8 10*3/uL (ref 3.4–10.8)

## 2017-04-07 LAB — MICROALBUMIN / CREATININE URINE RATIO
Creatinine, Urine: 101.5 mg/dL
Microalb/Creat Ratio: 108.4 mg/g creat — ABNORMAL HIGH (ref 0.0–30.0)
Microalbumin, Urine: 110 ug/mL

## 2017-04-07 LAB — LIPID PANEL
Chol/HDL Ratio: 4.3 ratio (ref 0.0–4.4)
Cholesterol, Total: 203 mg/dL — ABNORMAL HIGH (ref 100–199)
HDL: 47 mg/dL (ref >39)
LDL Calculated: 90 mg/dL (ref 0–99)
Triglycerides: 330 mg/dL — ABNORMAL HIGH (ref 0–149)
VLDL Cholesterol Cal: 66 mg/dL — ABNORMAL HIGH (ref 5–40)

## 2017-04-07 LAB — VITAMIN D 25 HYDROXY (VIT D DEFICIENCY, FRACTURES): Vit D, 25-Hydroxy: 27.9 ng/mL — ABNORMAL LOW (ref 30.0–100.0)

## 2017-04-07 LAB — TSH: TSH: 0.962 u[IU]/mL (ref 0.450–4.500)

## 2017-04-07 MED ORDER — TAMSULOSIN HCL 0.4 MG PO CAPS: 0.4000 mg | ORAL_CAPSULE | Freq: Every day | ORAL | 0 refills | Status: DC

## 2017-04-07 NOTE — Telephone Encounter (Signed)
Pt notified labs were sent to Dr Chalmers Cater

## 2017-04-11 ENCOUNTER — Telehealth: Payer: Self-pay | Admitting: Family Medicine

## 2017-04-11 NOTE — Telephone Encounter (Signed)
Patient aware that we have refaxed labs for her.

## 2017-05-01 ENCOUNTER — Other Ambulatory Visit: Payer: Self-pay | Admitting: Family Medicine

## 2017-05-01 DIAGNOSIS — E7800 Pure hypercholesterolemia, unspecified: Secondary | ICD-10-CM | POA: Diagnosis not present

## 2017-05-01 DIAGNOSIS — E669 Obesity, unspecified: Secondary | ICD-10-CM | POA: Diagnosis not present

## 2017-05-01 DIAGNOSIS — R809 Proteinuria, unspecified: Secondary | ICD-10-CM | POA: Diagnosis not present

## 2017-05-01 DIAGNOSIS — E1165 Type 2 diabetes mellitus with hyperglycemia: Secondary | ICD-10-CM | POA: Diagnosis not present

## 2017-05-20 ENCOUNTER — Other Ambulatory Visit: Payer: Self-pay | Admitting: Family Medicine

## 2017-05-24 ENCOUNTER — Other Ambulatory Visit: Payer: Self-pay | Admitting: Family Medicine

## 2017-05-29 DIAGNOSIS — E1142 Type 2 diabetes mellitus with diabetic polyneuropathy: Secondary | ICD-10-CM | POA: Diagnosis not present

## 2017-05-29 DIAGNOSIS — L97522 Non-pressure chronic ulcer of other part of left foot with fat layer exposed: Secondary | ICD-10-CM | POA: Diagnosis not present

## 2017-05-29 DIAGNOSIS — I10 Essential (primary) hypertension: Secondary | ICD-10-CM | POA: Diagnosis not present

## 2017-06-05 ENCOUNTER — Ambulatory Visit: Payer: Medicare Other | Admitting: Adult Health

## 2017-06-05 ENCOUNTER — Telehealth: Payer: Self-pay

## 2017-06-05 NOTE — Telephone Encounter (Signed)
Pt arrived late for appt. R/s'd 07/03/17 w/ Dr. Jannifer Franklin.

## 2017-06-05 NOTE — Telephone Encounter (Signed)
Pt no-showed today's appt.

## 2017-06-08 DIAGNOSIS — L97522 Non-pressure chronic ulcer of other part of left foot with fat layer exposed: Secondary | ICD-10-CM | POA: Diagnosis not present

## 2017-06-15 ENCOUNTER — Other Ambulatory Visit: Payer: Self-pay | Admitting: Family Medicine

## 2017-06-28 ENCOUNTER — Ambulatory Visit (HOSPITAL_COMMUNITY): Payer: Medicare Other | Attending: Cardiology

## 2017-06-28 ENCOUNTER — Other Ambulatory Visit: Payer: Self-pay

## 2017-06-28 DIAGNOSIS — E119 Type 2 diabetes mellitus without complications: Secondary | ICD-10-CM | POA: Insufficient documentation

## 2017-06-28 DIAGNOSIS — I350 Nonrheumatic aortic (valve) stenosis: Secondary | ICD-10-CM | POA: Diagnosis not present

## 2017-06-28 DIAGNOSIS — I272 Pulmonary hypertension, unspecified: Secondary | ICD-10-CM | POA: Diagnosis not present

## 2017-06-28 DIAGNOSIS — Z6833 Body mass index (BMI) 33.0-33.9, adult: Secondary | ICD-10-CM | POA: Diagnosis not present

## 2017-06-28 DIAGNOSIS — E785 Hyperlipidemia, unspecified: Secondary | ICD-10-CM | POA: Diagnosis not present

## 2017-06-28 DIAGNOSIS — I2510 Atherosclerotic heart disease of native coronary artery without angina pectoris: Secondary | ICD-10-CM | POA: Diagnosis not present

## 2017-06-28 DIAGNOSIS — I313 Pericardial effusion (noninflammatory): Secondary | ICD-10-CM | POA: Insufficient documentation

## 2017-06-28 DIAGNOSIS — I10 Essential (primary) hypertension: Secondary | ICD-10-CM | POA: Insufficient documentation

## 2017-06-29 DIAGNOSIS — I739 Peripheral vascular disease, unspecified: Secondary | ICD-10-CM | POA: Diagnosis not present

## 2017-06-29 DIAGNOSIS — L97522 Non-pressure chronic ulcer of other part of left foot with fat layer exposed: Secondary | ICD-10-CM | POA: Diagnosis not present

## 2017-06-29 DIAGNOSIS — I10 Essential (primary) hypertension: Secondary | ICD-10-CM | POA: Diagnosis not present

## 2017-06-29 DIAGNOSIS — E1142 Type 2 diabetes mellitus with diabetic polyneuropathy: Secondary | ICD-10-CM | POA: Diagnosis not present

## 2017-07-03 ENCOUNTER — Encounter: Payer: Self-pay | Admitting: Neurology

## 2017-07-03 ENCOUNTER — Ambulatory Visit (INDEPENDENT_AMBULATORY_CARE_PROVIDER_SITE_OTHER): Payer: Medicare Other | Admitting: Neurology

## 2017-07-03 VITALS — BP 141/76 | HR 95 | Ht 67.0 in | Wt 209.0 lb

## 2017-07-03 DIAGNOSIS — R413 Other amnesia: Secondary | ICD-10-CM

## 2017-07-03 NOTE — Progress Notes (Signed)
Reason for visit: Memory disturbance  [name] is an 66 y.o. female  History of present illness:  [name] is a 66 year old right-handed white female with a history of diabetes, obesity, and cerebrovascular disease with a history of TIA.  The patient reports a mild problem with memory, she has difficulty remembering names for people, she has not perceived any progression in her memory since last seen.  She continues to operate a motor vehicle without difficulty.  She has done better with her diabetic diet, she is starting to lose weight, her blood sugars are coming under control.  She is not on any medications for memory.  She returns to this office for an evaluation.  She remains on Plavix.  Past Medical History:  Diagnosis Date  . Abnormal Doppler ultrasound of carotid artery    a. 05/2013 - mild plaque, 1-39% BICA, abnormal appearrance of thyroid (TSH wnl in 06/2013).  Marland Kitchen CAD (coronary artery disease)    a. Nonobstructive by cath 2006 - 25% LAD, 25-30% after 1st diag, distal 25% PDA, minor irreg LCx. b. Normal nuc 2011.  . Cataract    surgery  . Chronic kidney disease    stage 3  . Cystocele   . Difficult intubation 09-07-2012   big neck trouble with intubation parotid surgery"takes little med to sedate"  . Dyslipidemia   . Esophageal stricture   . GERD (gastroesophageal reflux disease)   . Hiatal hernia    a. 07/2013: HH with small stricture holding up barium tablet (concides with patient's sx of food sticking).  . Hyperlipidemia   . Hypertension    dr Percival Spanish  . IBS (irritable bowel syndrome)   . IDDM (insulin dependent diabetes mellitus) ([place])    type 2   . Memory difficulty 11/26/2015  . Morbid obesity ([place])   . Osteoarthritis   . Osteopenia   . Osteoporosis   . Palpitations    a. PVC/PAC on EKG 08/2013.  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  Marland Kitchen Pericardial effusion    a. Mod by echo 2012 at time of PNA. b. Echo 07/2012: small  pericardial effusion vs fat.  Marland Kitchen PONV (postoperative nausea and vomiting)    n/v, and room spinning after cataract and parotid surgery  . Sleep apnea    CPAP currently broke, calling company about machine, pt does not know cpap settings, uses cpap off and on  . Stroke [facility]) 11-07-2014   tia  . Superficial phlebitis   . TIA (transient ischemic attack) 2016  . Toe ulcer ([place]) 01-08-14   Left big toe-being tx. at wound center "is healing"    Past Surgical History:  Procedure Laterality Date  . BREATH TEK H PYLORI N/A 07/29/2013   Performed by Alphonsa Overall, MD at [facility]  . CARDIAC CATHETERIZATION     06  . CARPAL TUNNEL RELEASE  5 yrs ago   Left  . CATARACT EXTRACTION Bilateral yrs ago  . CHOLECYSTECTOMY  1982  . COLONOSCOPY N/A 07/14/2015   Performed by Ladene Artist, MD at [facility]  . ESOPHAGOGASTRODUODENOSCOPY (EGD) N/A 12/27/2013   Performed by Alphonsa Overall, MD at [facility]  . EYE SURGERY    . LAPAROSCOPIC GASTRIC SLEEVE RESECTION N/A 01/20/2014   Performed by Alphonsa Overall, MD at [facility] ORS  . PAROTIDECTOMY Left 09/07/2012   Performed by Melida Quitter, MD at [facility] OR  . TONSILLECTOMY  1968  . TUBAL LIGATION  yrs ago  .  UPPER GI ENDOSCOPY  01/20/2014   Performed by Alphonsa Overall, MD at [facility] ORS    Family History  Problem Relation Age of Onset  . Heart failure Mother   . Hypertension Mother   . Skin cancer Mother   . Colitis Mother   . Diabetes Mother   . Kidney Stones Mother   . Stroke Mother   . Heart disease Father   . Colon polyps Father   . Diabetes Father   . Heart failure Father   . Heart attack Sister   . Diabetes Other   . Diabetes Daughter   . Stroke Maternal Grandfather   . Diabetes Maternal Grandfather   . Colon cancer Neg Hx     Social history:  reports that  has never smoked. she has never used smokeless tobacco. She reports that she drinks alcohol. She reports that she uses drugs. Drug: Other-see comments.    Allergies  Allergen Reactions    . Atorvastatin     Myalgias   . Crestor [Rosuvastatin Calcium] Other (See Comments)    Stiffness and back pain  . Ezetimibe-Simvastatin Other (See Comments)    Leg cramps  . Celebrex [Celecoxib] Rash  . Levemir [Insulin Detemir] Rash  . Penicillins Other (See Comments)    SYNCOPE (Tolerated Ancef) Has patient had a PCN reaction causing immediate rash, facial/tongue/throat swelling, SOB or lightheadedness with hypotension: Yes Has patient had a PCN reaction causing severe rash involving mucus membranes or skin necrosis: No Has patient had a PCN reaction that required hospitalization No Has patient had a PCN reaction occurring within the last 10 years: No If all of the above answers are "NO", then may proceed with Cephalosporin use.    Medications:  Prior to Admission medications   Medication Sig Start Date End Date Taking? Authorizing Provider  B-D INS SYR ULTRAFINE 1CC/31G 31G X 5/16" 1 ML MISC USE TO INJECT INSULIN TWICE A DAY AS INSTRUCTED 11/16/15  Yes Chipper Herb, MD  Calcium Carbonate-Vitamin D (CALCIUM 600+D) 600-400 MG-UNIT per tablet Take 1 tablet by mouth 2 (two) times daily. 05/06/15  Yes Cherre Robins, PharmD  cholecalciferol (VITAMIN D) 1000 UNITS tablet Take 5,000 Units by mouth every other day.    Yes [provider]  clopidogrel (PLAVIX) 75 MG tablet Take 1 Tablet by mouth once daily 05/24/17  Yes Chipper Herb, MD  fenofibrate (TRICOR) 145 MG tablet Take 1 Tablet by mouth once daily 05/01/17  Yes Chipper Herb, MD  glucose blood (ACCU-CHEK AVIVA PLUS) test strip CHECK BLOOD SUGAR UP TO TWICE DAILY OR AS INSTRUCTED-Dx E11.51 08/22/16  Yes Chipper Herb, MD  Insulin Pen Needle 32G X 4 MM MISC Use to inject insulin with insulin pen 05/17/16  Yes Chipper Herb, MD  LIVALO 4 MG TABS Take 1 Tablet by mouth once daily 03/27/17  Yes Chipper Herb, MD  losartan (COZAAR) 25 MG tablet Take 1 Tablet by mouth once daily 06/15/17  Yes Chipper Herb, MD  meclizine  (ANTIVERT) 12.5 MG tablet Take 1 tablet (12.5 mg total) by mouth 3 (three) times daily as needed for dizziness. 01/30/15  Yes Chipper Herb, MD  metFORMIN (GLUCOPHAGE) 1000 MG tablet Take 1 Tablet by mouth 2 times a day 05/22/17  Yes Chipper Herb, MD  Multiple Vitamin (MULTIVITAMIN WITH MINERALS) TABS tablet Take 1 tablet by mouth 2 (two) times daily.   Yes [provider]  NOVOLIN 70/30 RELION (70-30) 100 UNIT/ML injection  07/13/16  Yes [provider]  ondansetron (ZOFRAN ODT) 4 MG disintegrating tablet Take 1 tablet (4 mg total) by mouth every 8 (eight) hours as needed for nausea. 01/03/17  Yes Larene Pickett, PA-C  sodium chloride (OCEAN) 0.65 % SOLN nasal spray Place 1 spray into both nostrils as needed for congestion. 11/09/14  Yes Domenic Polite, MD    ROS:  Out of a complete 14 system review of symptoms, the patient complains only of the following symptoms, and all other reviewed systems are negative.  Blurred vision  Blood pressure (!) 141/76, pulse 95, height 5\' 7"  (1.702 m), weight 209 lb (94.8 kg).  Physical Exam  General: The patient is alert and cooperative at the time of the examination.  The patient is markedly obese.  Central obesity is noted.  Skin: No significant peripheral edema is noted.  The patient is wearing a therapeutic boot on the left foot.   Neurologic Exam  Mental status: The patient is alert and oriented x 3 at the time of the examination. The patient has apparent normal recent and remote memory, with an apparently normal attention span and concentration ability.  Mini-Mental status examination done today shows a total score 29/30.   Cranial nerves: Facial symmetry is present. Speech is normal, no aphasia or dysarthria is noted. Extraocular movements are full. Visual fields are full.  Motor: The patient has good strength in all 4 extremities.  Sensory examination: Soft touch sensation is symmetric on the face, arms, and  legs.  Coordination: The patient has good finger-nose-finger and heel-to-shin bilaterally.  Gait and station: The patient has a slight limping gait on the left leg.  Romberg is negative. No drift is seen.  Reflexes: Deep tendon reflexes are symmetric.   Assessment/Plan:  1.  History of cerebrovascular disease  2.  Mild memory disturbance  The patient seems to be very stable with her memory issues, we will have her contact our office if she believes that her memory is declining significantly.  Otherwise, the patient will follow-up on an as-needed basis.  Jill Alexanders MD 07/03/2017 12:03 PM  [facility] [address] [place], [place] [address]  Phone [phone] Fax [phone]

## 2017-07-04 DIAGNOSIS — Z961 Presence of intraocular lens: Secondary | ICD-10-CM | POA: Diagnosis not present

## 2017-07-04 DIAGNOSIS — H04123 Dry eye syndrome of bilateral lacrimal glands: Secondary | ICD-10-CM | POA: Diagnosis not present

## 2017-07-04 DIAGNOSIS — H35033 Hypertensive retinopathy, bilateral: Secondary | ICD-10-CM | POA: Diagnosis not present

## 2017-07-04 DIAGNOSIS — E113293 Type 2 diabetes mellitus with mild nonproliferative diabetic retinopathy without macular edema, bilateral: Secondary | ICD-10-CM | POA: Diagnosis not present

## 2017-07-04 LAB — HM DIABETES EYE EXAM

## 2017-07-10 ENCOUNTER — Ambulatory Visit (INDEPENDENT_AMBULATORY_CARE_PROVIDER_SITE_OTHER): Payer: Medicare Other | Admitting: *Deleted

## 2017-07-10 DIAGNOSIS — Z23 Encounter for immunization: Secondary | ICD-10-CM

## 2017-07-15 HISTORY — PX: TOE AMPUTATION: SHX809

## 2017-07-17 DIAGNOSIS — I739 Peripheral vascular disease, unspecified: Secondary | ICD-10-CM | POA: Diagnosis not present

## 2017-07-19 ENCOUNTER — Telehealth: Payer: Self-pay | Admitting: Family Medicine

## 2017-07-19 NOTE — Telephone Encounter (Signed)
Pt aware none available at this time

## 2017-08-01 DIAGNOSIS — L97529 Non-pressure chronic ulcer of other part of left foot with unspecified severity: Secondary | ICD-10-CM | POA: Diagnosis not present

## 2017-08-01 DIAGNOSIS — M86172 Other acute osteomyelitis, left ankle and foot: Secondary | ICD-10-CM | POA: Diagnosis not present

## 2017-08-01 DIAGNOSIS — Z888 Allergy status to other drugs, medicaments and biological substances status: Secondary | ICD-10-CM | POA: Diagnosis not present

## 2017-08-01 DIAGNOSIS — Z880 Allergy status to penicillin: Secondary | ICD-10-CM | POA: Diagnosis not present

## 2017-08-01 DIAGNOSIS — Z8673 Personal history of transient ischemic attack (TIA), and cerebral infarction without residual deficits: Secondary | ICD-10-CM | POA: Diagnosis not present

## 2017-08-01 DIAGNOSIS — Z7902 Long term (current) use of antithrombotics/antiplatelets: Secondary | ICD-10-CM | POA: Diagnosis not present

## 2017-08-01 DIAGNOSIS — Z794 Long term (current) use of insulin: Secondary | ICD-10-CM | POA: Diagnosis not present

## 2017-08-01 DIAGNOSIS — S91301D Unspecified open wound, right foot, subsequent encounter: Secondary | ICD-10-CM | POA: Diagnosis not present

## 2017-08-01 DIAGNOSIS — Z7982 Long term (current) use of aspirin: Secondary | ICD-10-CM | POA: Diagnosis not present

## 2017-08-01 DIAGNOSIS — E1142 Type 2 diabetes mellitus with diabetic polyneuropathy: Secondary | ICD-10-CM | POA: Diagnosis not present

## 2017-08-01 DIAGNOSIS — E1159 Type 2 diabetes mellitus with other circulatory complications: Secondary | ICD-10-CM | POA: Diagnosis not present

## 2017-08-01 DIAGNOSIS — E119 Type 2 diabetes mellitus without complications: Secondary | ICD-10-CM | POA: Diagnosis not present

## 2017-08-01 DIAGNOSIS — E1165 Type 2 diabetes mellitus with hyperglycemia: Secondary | ICD-10-CM | POA: Diagnosis not present

## 2017-08-01 DIAGNOSIS — Z79899 Other long term (current) drug therapy: Secondary | ICD-10-CM | POA: Diagnosis not present

## 2017-08-01 DIAGNOSIS — Z89412 Acquired absence of left great toe: Secondary | ICD-10-CM | POA: Diagnosis not present

## 2017-08-01 DIAGNOSIS — G4733 Obstructive sleep apnea (adult) (pediatric): Secondary | ICD-10-CM | POA: Diagnosis not present

## 2017-08-01 DIAGNOSIS — M7989 Other specified soft tissue disorders: Secondary | ICD-10-CM | POA: Diagnosis not present

## 2017-08-01 DIAGNOSIS — M869 Osteomyelitis, unspecified: Secondary | ICD-10-CM | POA: Diagnosis not present

## 2017-08-01 DIAGNOSIS — M79672 Pain in left foot: Secondary | ICD-10-CM | POA: Diagnosis not present

## 2017-08-01 DIAGNOSIS — I70262 Atherosclerosis of native arteries of extremities with gangrene, left leg: Secondary | ICD-10-CM | POA: Diagnosis not present

## 2017-08-01 DIAGNOSIS — I350 Nonrheumatic aortic (valve) stenosis: Secondary | ICD-10-CM | POA: Diagnosis present

## 2017-08-01 DIAGNOSIS — I2510 Atherosclerotic heart disease of native coronary artery without angina pectoris: Secondary | ICD-10-CM | POA: Diagnosis present

## 2017-08-01 DIAGNOSIS — E1122 Type 2 diabetes mellitus with diabetic chronic kidney disease: Secondary | ICD-10-CM | POA: Diagnosis not present

## 2017-08-01 DIAGNOSIS — E1169 Type 2 diabetes mellitus with other specified complication: Secondary | ICD-10-CM | POA: Diagnosis not present

## 2017-08-01 DIAGNOSIS — N183 Chronic kidney disease, stage 3 (moderate): Secondary | ICD-10-CM | POA: Diagnosis not present

## 2017-08-01 DIAGNOSIS — E785 Hyperlipidemia, unspecified: Secondary | ICD-10-CM | POA: Diagnosis not present

## 2017-08-01 DIAGNOSIS — Z9884 Bariatric surgery status: Secondary | ICD-10-CM | POA: Diagnosis not present

## 2017-08-01 DIAGNOSIS — I129 Hypertensive chronic kidney disease with stage 1 through stage 4 chronic kidney disease, or unspecified chronic kidney disease: Secondary | ICD-10-CM | POA: Diagnosis not present

## 2017-08-01 DIAGNOSIS — Z91048 Other nonmedicinal substance allergy status: Secondary | ICD-10-CM | POA: Diagnosis not present

## 2017-08-01 DIAGNOSIS — S91302A Unspecified open wound, left foot, initial encounter: Secondary | ICD-10-CM | POA: Diagnosis not present

## 2017-08-01 DIAGNOSIS — N179 Acute kidney failure, unspecified: Secondary | ICD-10-CM | POA: Diagnosis not present

## 2017-08-01 DIAGNOSIS — I96 Gangrene, not elsewhere classified: Secondary | ICD-10-CM | POA: Diagnosis not present

## 2017-08-01 DIAGNOSIS — K219 Gastro-esophageal reflux disease without esophagitis: Secondary | ICD-10-CM | POA: Diagnosis present

## 2017-08-01 DIAGNOSIS — I10 Essential (primary) hypertension: Secondary | ICD-10-CM | POA: Diagnosis not present

## 2017-08-01 DIAGNOSIS — M868X7 Other osteomyelitis, ankle and foot: Secondary | ICD-10-CM | POA: Diagnosis not present

## 2017-08-01 DIAGNOSIS — I739 Peripheral vascular disease, unspecified: Secondary | ICD-10-CM | POA: Diagnosis not present

## 2017-08-01 DIAGNOSIS — Z23 Encounter for immunization: Secondary | ICD-10-CM | POA: Diagnosis not present

## 2017-08-01 DIAGNOSIS — E11621 Type 2 diabetes mellitus with foot ulcer: Secondary | ICD-10-CM | POA: Diagnosis not present

## 2017-08-09 MED ORDER — INSULIN LISPRO 100 UNIT/ML ~~LOC~~ SOLN
SUBCUTANEOUS | Status: DC
Start: ? — End: 2017-08-09

## 2017-08-09 MED ORDER — ANTACID & ANTIGAS 200-200-20 MG/5ML PO SUSP
30.00 | ORAL | Status: DC
Start: ? — End: 2017-08-09

## 2017-08-09 MED ORDER — LOSARTAN POTASSIUM 25 MG PO TABS
25.00 | ORAL_TABLET | ORAL | Status: DC
Start: 2017-08-08 — End: 2017-08-09

## 2017-08-09 MED ORDER — NITROGLYCERIN 0.4 MG SL SUBL
0.40 | SUBLINGUAL_TABLET | SUBLINGUAL | Status: DC
Start: ? — End: 2017-08-09

## 2017-08-09 MED ORDER — HYDROCODONE-ACETAMINOPHEN 5-325 MG PO TABS
ORAL_TABLET | ORAL | Status: DC
Start: ? — End: 2017-08-09

## 2017-08-09 MED ORDER — GENERIC EXTERNAL MEDICATION
Status: DC
Start: ? — End: 2017-08-09

## 2017-08-09 MED ORDER — HYDROMORPHONE HCL 1 MG/ML IJ SOLN
0.50 | INTRAMUSCULAR | Status: DC
Start: ? — End: 2017-08-09

## 2017-08-09 MED ORDER — CLOPIDOGREL BISULFATE 75 MG PO TABS
75.00 | ORAL_TABLET | ORAL | Status: DC
Start: 2017-08-08 — End: 2017-08-09

## 2017-08-09 MED ORDER — HEPARIN SODIUM (PORCINE) 5000 UNIT/ML IJ SOLN
INTRAMUSCULAR | Status: DC
Start: 2017-08-07 — End: 2017-08-09

## 2017-08-09 MED ORDER — HYDRALAZINE HCL 20 MG/ML IJ SOLN
10.00 | INTRAMUSCULAR | Status: DC
Start: ? — End: 2017-08-09

## 2017-08-09 MED ORDER — GENERIC EXTERNAL MEDICATION
0.08 | Status: DC
Start: ? — End: 2017-08-09

## 2017-08-09 MED ORDER — GENERIC EXTERNAL MEDICATION
1.75 | Status: DC
Start: 2017-08-07 — End: 2017-08-09

## 2017-08-09 MED ORDER — GENERIC EXTERNAL MEDICATION
1.00 | Status: DC
Start: 2017-08-07 — End: 2017-08-09

## 2017-08-09 MED ORDER — INSULIN GLARGINE 100 UNIT/ML ~~LOC~~ SOLN
SUBCUTANEOUS | Status: DC
Start: 2017-08-07 — End: 2017-08-09

## 2017-08-09 MED ORDER — GENERIC EXTERNAL MEDICATION
0.04 | Status: DC
Start: ? — End: 2017-08-09

## 2017-08-09 MED ORDER — INSULIN LISPRO 100 UNIT/ML ~~LOC~~ SOLN
SUBCUTANEOUS | Status: DC
Start: 2017-08-07 — End: 2017-08-09

## 2017-08-09 MED ORDER — METOPROLOL TARTRATE 25 MG PO TABS
25.00 | ORAL_TABLET | ORAL | Status: DC
Start: 2017-08-07 — End: 2017-08-09

## 2017-08-11 ENCOUNTER — Ambulatory Visit (INDEPENDENT_AMBULATORY_CARE_PROVIDER_SITE_OTHER): Payer: Medicare Other | Admitting: Family Medicine

## 2017-08-11 ENCOUNTER — Encounter: Payer: Self-pay | Admitting: Family Medicine

## 2017-08-11 VITALS — BP 140/62 | HR 82 | Temp 97.5°F | Ht 67.0 in | Wt 211.0 lb

## 2017-08-11 DIAGNOSIS — Z9884 Bariatric surgery status: Secondary | ICD-10-CM | POA: Diagnosis not present

## 2017-08-11 DIAGNOSIS — N898 Other specified noninflammatory disorders of vagina: Secondary | ICD-10-CM

## 2017-08-11 DIAGNOSIS — N183 Chronic kidney disease, stage 3 unspecified: Secondary | ICD-10-CM

## 2017-08-11 DIAGNOSIS — E1122 Type 2 diabetes mellitus with diabetic chronic kidney disease: Secondary | ICD-10-CM | POA: Diagnosis not present

## 2017-08-11 DIAGNOSIS — Z89412 Acquired absence of left great toe: Secondary | ICD-10-CM | POA: Diagnosis not present

## 2017-08-11 DIAGNOSIS — E669 Obesity, unspecified: Secondary | ICD-10-CM | POA: Diagnosis not present

## 2017-08-11 DIAGNOSIS — E7800 Pure hypercholesterolemia, unspecified: Secondary | ICD-10-CM

## 2017-08-11 DIAGNOSIS — I350 Nonrheumatic aortic (valve) stenosis: Secondary | ICD-10-CM | POA: Diagnosis not present

## 2017-08-11 DIAGNOSIS — E1151 Type 2 diabetes mellitus with diabetic peripheral angiopathy without gangrene: Secondary | ICD-10-CM

## 2017-08-11 DIAGNOSIS — I10 Essential (primary) hypertension: Secondary | ICD-10-CM | POA: Diagnosis not present

## 2017-08-11 LAB — BAYER DCA HB A1C WAIVED: HB A1C (BAYER DCA - WAIVED): 8.2 % — ABNORMAL HIGH (ref <7.0)

## 2017-08-11 NOTE — Patient Instructions (Addendum)
Medicare Annual Wellness Visit  [place] and the medical providers at [facility] strive to bring you the best medical care.  In doing so we not only want to address your current medical conditions and concerns but also to detect new conditions early and prevent illness, disease and health-related problems.    Medicare offers a yearly Wellness Visit which allows our clinical staff to assess your need for preventative services including immunizations, lifestyle education, counseling to decrease risk of preventable diseases and screening for fall risk and other medical concerns.    This visit is provided free of charge (no copay) for all Medicare recipients. The clinical pharmacists at [facility] have begun to conduct these Wellness Visits which will also include a thorough review of all your medications.    As you primary medical provider recommend that you make an appointment for your Annual Wellness Visit if you have not done so already this year.  You may set up this appointment before you leave today or you may call back ([phone]) and schedule an appointment.  Please make sure when you call that you mention that you are scheduling your Annual Wellness Visit with the clinical pharmacist so that the appointment may be made for the proper length of time.     Continue current medications. Continue good therapeutic lifestyle changes which include good diet and exercise. Fall precautions discussed with patient. If an FOBT was given today- please return it to our front desk. If you are over 64 years old - you may need Prevnar 44 or the adult Pneumonia vaccine.  **Flu shots are available--- please call and schedule a FLU-CLINIC appointment**  After your visit with [place] today you will receive a survey in the mail or online from [employer] regarding your care with [place]. Please take a moment to fill this out. Your feedback is very  important to [place] as you can help [place] better understand your patient needs as well as improve your experience and satisfaction. WE CARE ABOUT YOU!!!   We will fill out the form so that you are able to get a handicap sticker for your motor vehicle Continue to exercise as directed by the orthopedic surgeon and follow-up with him as planned Practice good hand and pulmonary hygiene Drink plenty of fluids and stay well-hydrated Follow-up with cardiology as planned Try Monistat vaginal cream over-the-counter for vaginal itching and irritation apply this twice daily.

## 2017-08-11 NOTE — Progress Notes (Signed)
Subjective:    Patient ID: [name], female    DOB: 1950-12-17, 66 y.o.   MRN: [id number]  HPI Pt here for follow up and management of chronic medical problems which includes hyperlipidemia and diabetes. She is taking medication regularly.  Patient has recently had her left great toe amputated because of infection.  This would be because of osteomyelitis.  This was done at [facility] in [place].  The patient will get lab work today and is due to return in FOBT.  Patient is in good spirits.  The amputation was actually done at [facility] and not [facility].  The patient was in good spirits and describes no chest pain pressure tightness or shortness of breath.  She denies any trouble with nausea vomiting diarrhea blood in the stool or black tarry bowel movement and is passing her water well and trying to drink plenty of fluids.  The patient does complain of some vaginal itching.  The patient also is requesting a handicap sticker for her car.     Patient Active Problem List   Diagnosis Date Noted  . Nonrheumatic aortic valve stenosis 02/13/2017  . Palpitations 02/13/2017  . Memory difficulty 11/26/2015  . Benign neoplasm of sigmoid colon 07/14/2015  . Low serum vitamin D 05/06/2015  . Vertigo 05/06/2015  . Aphasia 12/08/2014  . TIA (transient ischemic attack) 11/07/2014  . 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 CKD stage 3 due to type 2 diabetes mellitus ([place]) 06/03/2014  . Type II diabetes mellitus with renal manifestations ([place]) 06/03/2014  . Type II diabetes mellitus with peripheral circulatory disorder ([place]) 06/03/2014  . Obesity (BMI 30.0-34.9) 04/24/2014  . Dizziness and giddiness 04/14/2014  . 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 Osteopenia 10/09/2013  . Abnormal ankle brachial index 09/11/2013  . Hypomagnesemia 08/01/2013  . DYSPNEA 08/18/2010  . MYOCARDIAL PERFUSION SCAN, WITH STRESS  TEST, ABNORMAL 08/04/2010  . ELECTROCARDIOGRAM, ABNORMAL 07/28/2010  . Edema extremities 12/30/2008  . Hyperlipidemia 12/26/2008  . Benign essential HTN 12/26/2008  . CAD 12/26/2008  . PERICARDIAL EFFUSION 12/26/2008  . GERD 12/26/2008  . OSTEOARTHRITIS 12/26/2008   Outpatient Encounter Medications as of 08/11/2017  Medication Sig  . B-D INS SYR ULTRAFINE 1CC/31G 31G X 5/16" 1 ML MISC USE TO INJECT INSULIN TWICE A DAY AS INSTRUCTED  . Calcium Carbonate-Vitamin D (CALCIUM 600+D) 600-400 MG-UNIT per tablet Take 1 tablet by mouth 2 (two) times daily.  . cholecalciferol (VITAMIN D) 1000 UNITS tablet Take 5,000 Units by mouth every other day.   . clopidogrel (PLAVIX) 75 MG tablet Take 1 Tablet by mouth once daily  . doxycycline (VIBRA-TABS) 100 MG tablet Take by mouth.  . fenofibrate (TRICOR) 145 MG tablet Take 1 Tablet by mouth once daily  . glucose blood (ACCU-CHEK AVIVA PLUS) test strip CHECK BLOOD SUGAR UP TO TWICE DAILY OR AS INSTRUCTED-Dx E11.51  . Insulin Pen Needle 32G X 4 MM MISC Use to inject insulin with insulin pen  . LIVALO 4 MG TABS Take 1 Tablet by mouth once daily  . losartan (COZAAR) 25 MG tablet Take 1 Tablet by mouth once daily  . meclizine (ANTIVERT) 12.5 MG tablet Take 1 tablet (12.5 mg total) by mouth 3 (three) times daily as needed for dizziness.  . metFORMIN (GLUCOPHAGE) 1000 MG tablet Take 1 Tablet by mouth 2 times a day  . Multiple Vitamin (MULTIVITAMIN WITH MINERALS) TABS tablet Take 1 tablet by mouth 2 (  two) times daily.  Marland Kitchen NOVOLIN 70/30 RELION (70-30) 100 UNIT/ML injection   . ondansetron (ZOFRAN ODT) 4 MG disintegrating tablet Take 1 tablet (4 mg total) by mouth every 8 (eight) hours as needed for nausea.  . sodium chloride (OCEAN) 0.65 % SOLN nasal spray Place 1 spray into both nostrils as needed for congestion.   No facility-administered encounter medications on file as of 08/11/2017.      Review of Systems  Constitutional: Negative.   HENT: Negative.     Eyes: Negative.   Respiratory: Negative.   Cardiovascular: Negative.   Gastrointestinal: Negative.   Endocrine: Negative.   Genitourinary: Negative.   Musculoskeletal: Negative.        Recent amputation of left great toe  Skin: Negative.   Allergic/Immunologic: Negative.   Neurological: Negative.   Hematological: Negative.   Psychiatric/Behavioral: Negative.        Objective:   Physical Exam  Constitutional: She is oriented to person, place, and time. She appears well-developed and well-nourished. No distress.  HENT:  Head: Normocephalic and atraumatic.  Right Ear: External ear normal.  Left Ear: External ear normal.  Nose: Nose normal.  Mouth/Throat: Oropharynx is clear and moist.  Eyes: Conjunctivae and EOM are normal. Pupils are equal, round, and reactive to light. Right eye exhibits no discharge. Left eye exhibits no discharge. No scleral icterus.  Neck: Normal range of motion. Neck supple. No thyromegaly present.  Bilateral carotid bruits most likely from murmur of aortic stenosis.  No adenopathy.  Cardiovascular: Normal rate and normal heart sounds.  No murmur heard. Heart is irregular irregular at 84/min  Pulmonary/Chest: Effort normal and breath sounds normal. No respiratory distress. She has no wheezes. She has no rales.  Clear anteriorly and posteriorly  Abdominal: Soft. Bowel sounds are normal. She exhibits no mass. There is no tenderness. There is no rebound and no guarding.  Abdominal obesity without masses tenderness or organ enlargement or bruits  Musculoskeletal: She exhibits no edema.  The patient is using a walker because of her recent toe amputation and brace on the left foot.  There was no apparent edema in the right leg.  Pulses were not checked today due to the fact that she has had recent surgery.  She plans to return to see the orthopedic surgeon on Monday.  Lymphadenopathy:    She has no cervical adenopathy.  Neurological: She is alert and oriented to  person, place, and time.  Skin: Skin is warm and dry. No rash noted.  Psychiatric: She has a normal mood and affect. Her behavior is normal. Judgment and thought content normal.  Nursing note and vitals reviewed.  BP 140/62 (BP Location: Left Arm)   Pulse 82   Temp (!) 97.5 F (36.4 C) (Oral)   Ht 5' 7" (1.702 m)   Wt 211 lb (95.7 kg)   BMI 33.05 kg/m        Assessment & Plan:  1. Pure hypercholesterolemia -Continue medications as currently doing and his aggressive therapeutic lifestyle changes as possible - Lipid panel - Hepatic function panel - CBC with Differential/Platelet  2. Type II diabetes mellitus with peripheral circulatory disorder [facility]) -Patient reports that her A1c was high in the hospital.  This will be repeated and the patient will continue with her current treatment pending results of lab work - BMP8+EGFR - CBC with Differential/Platelet - Bayer DCA Hb A1c Waived  3. Chronic kidney disease, stage 3 (moderate) (HCC) -Continue to avoid all NSAIDs - BMP8+EGFR - CBC  with Differential/Platelet  4. Benign essential HTN -Blood pressure is stable today no change in treatment - BMP8+EGFR - Hepatic function panel - CBC with Differential/Platelet  5. Nonrheumatic aortic valve stenosis -Follow-up with cardiology as planned  6. Obesity, unspecified classification, unspecified obesity type, unspecified whether serious comorbidity present -Continue with as aggressive therapeutic lifestyle changes as possible including diet and exercise as tolerated and as recommended by orthopedic surgeon  7. History of gastric bypass -Continue aggressive therapeutic lifestyle changes  8. CKD stage 3 due to type 2 diabetes mellitus ([place]) -Continue to avoid all NSAIDs and keep blood pressure under the best control possible  9. Status post amputation of left great toe ([place]) -Follow-up with orthopedics as planned  10. Vaginal itching -Monistat vaginal cream over-the-counter to  apply twice daily to the vaginal area  Patient Instructions                       Medicare Annual Wellness Visit  [place] and the medical providers at [facility] strive to bring you the best medical care.  In doing so we not only want to address your current medical conditions and concerns but also to detect new conditions early and prevent illness, disease and health-related problems.    Medicare offers a yearly Wellness Visit which allows our clinical staff to assess your need for preventative services including immunizations, lifestyle education, counseling to decrease risk of preventable diseases and screening for fall risk and other medical concerns.    This visit is provided free of charge (no copay) for all Medicare recipients. The clinical pharmacists at [facility] have begun to conduct these Wellness Visits which will also include a thorough review of all your medications.    As you primary medical provider recommend that you make an appointment for your Annual Wellness Visit if you have not done so already this year.  You may set up this appointment before you leave today or you may call back ([phone]) and schedule an appointment.  Please make sure when you call that you mention that you are scheduling your Annual Wellness Visit with the clinical pharmacist so that the appointment may be made for the proper length of time.     Continue current medications. Continue good therapeutic lifestyle changes which include good diet and exercise. Fall precautions discussed with patient. If an FOBT was given today- please return it to our front desk. If you are over 29 years old - you may need Prevnar 45 or the adult Pneumonia vaccine.  **Flu shots are available--- please call and schedule a FLU-CLINIC appointment**  After your visit with [place] today you will receive a survey in the mail or online from [employer] regarding your care with [place].  Please take a moment to fill this out. Your feedback is very important to [place] as you can help [place] better understand your patient needs as well as improve your experience and satisfaction. WE CARE ABOUT YOU!!!   We will fill out the form so that you are able to get a handicap sticker for your motor vehicle Continue to exercise as directed by the orthopedic surgeon and follow-up with him as planned Practice good hand and pulmonary hygiene Drink plenty of fluids and stay well-hydrated Follow-up with cardiology as planned Try Monistat vaginal cream over-the-counter for vaginal itching and irritation apply this twice daily.  Arrie Senate MD

## 2017-08-12 LAB — BMP8+EGFR
BUN/Creatinine Ratio: 22 (ref 12–28)
BUN: 27 mg/dL (ref 8–27)
CO2: 24 mmol/L (ref 20–29)
Calcium: 9.9 mg/dL (ref 8.7–10.3)
Chloride: 106 mmol/L (ref 96–106)
Creatinine, Ser: 1.21 mg/dL — ABNORMAL HIGH (ref 0.57–1.00)
GFR calc Af Amer: 54 mL/min/{1.73_m2} — ABNORMAL LOW (ref >59)
GFR calc non Af Amer: 47 mL/min/{1.73_m2} — ABNORMAL LOW (ref >59)
Glucose: 78 mg/dL (ref 65–99)
Potassium: 4.5 mmol/L (ref 3.5–5.2)
Sodium: 144 mmol/L (ref 134–144)

## 2017-08-12 LAB — HEPATIC FUNCTION PANEL
ALT: 25 IU/L (ref 0–32)
AST: 23 IU/L (ref 0–40)
Albumin: 3.9 g/dL (ref 3.6–4.8)
Alkaline Phosphatase: 73 IU/L (ref 39–117)
Bilirubin Total: 0.4 mg/dL (ref 0.0–1.2)
Bilirubin, Direct: 0.13 mg/dL (ref 0.00–0.40)
Total Protein: 6.6 g/dL (ref 6.0–8.5)

## 2017-08-12 LAB — CBC WITH DIFFERENTIAL/PLATELET
Basophils Absolute: 0.1 10*3/uL (ref 0.0–0.2)
Basos: 1 %
EOS (ABSOLUTE): 0.4 10*3/uL (ref 0.0–0.4)
Eos: 4 %
Hematocrit: 37 % (ref 34.0–46.6)
Hemoglobin: 11.9 g/dL (ref 11.1–15.9)
Immature Grans (Abs): 0 10*3/uL (ref 0.0–0.1)
Immature Granulocytes: 0 %
Lymphocytes Absolute: 2.8 10*3/uL (ref 0.7–3.1)
Lymphs: 31 %
MCH: 27.3 pg (ref 26.6–33.0)
MCHC: 32.2 g/dL (ref 31.5–35.7)
MCV: 85 fL (ref 79–97)
Monocytes Absolute: 0.5 10*3/uL (ref 0.1–0.9)
Monocytes: 6 %
Neutrophils Absolute: 5.1 10*3/uL (ref 1.4–7.0)
Neutrophils: 58 %
Platelets: 370 10*3/uL (ref 150–379)
RBC: 4.36 x10E6/uL (ref 3.77–5.28)
RDW: 14.3 % (ref 12.3–15.4)
WBC: 8.8 10*3/uL (ref 3.4–10.8)

## 2017-08-12 LAB — LIPID PANEL
Chol/HDL Ratio: 4 ratio (ref 0.0–4.4)
Cholesterol, Total: 178 mg/dL (ref 100–199)
HDL: 45 mg/dL (ref >39)
LDL Calculated: 95 mg/dL (ref 0–99)
Triglycerides: 192 mg/dL — ABNORMAL HIGH (ref 0–149)
VLDL Cholesterol Cal: 38 mg/dL (ref 5–40)

## 2017-08-17 ENCOUNTER — Ambulatory Visit: Payer: Medicare Other | Admitting: Adult Health

## 2017-08-17 DIAGNOSIS — Z0289 Encounter for other administrative examinations: Secondary | ICD-10-CM

## 2017-08-18 ENCOUNTER — Encounter: Payer: Self-pay | Admitting: *Deleted

## 2017-08-18 ENCOUNTER — Telehealth: Payer: Self-pay | Admitting: Family Medicine

## 2017-08-18 NOTE — Telephone Encounter (Signed)
Patient was called .

## 2017-09-03 ENCOUNTER — Other Ambulatory Visit: Payer: Self-pay | Admitting: Family Medicine

## 2017-09-05 ENCOUNTER — Other Ambulatory Visit: Payer: Self-pay | Admitting: Family Medicine

## 2017-09-14 DIAGNOSIS — I10 Essential (primary) hypertension: Secondary | ICD-10-CM | POA: Diagnosis not present

## 2017-09-14 DIAGNOSIS — Z89412 Acquired absence of left great toe: Secondary | ICD-10-CM | POA: Diagnosis not present

## 2017-09-14 DIAGNOSIS — E1151 Type 2 diabetes mellitus with diabetic peripheral angiopathy without gangrene: Secondary | ICD-10-CM | POA: Diagnosis not present

## 2017-09-14 DIAGNOSIS — T8189XA Other complications of procedures, not elsewhere classified, initial encounter: Secondary | ICD-10-CM | POA: Diagnosis not present

## 2017-09-17 ENCOUNTER — Encounter: Payer: Self-pay | Admitting: Family Medicine

## 2017-09-17 DIAGNOSIS — E11319 Type 2 diabetes mellitus with unspecified diabetic retinopathy without macular edema: Secondary | ICD-10-CM | POA: Insufficient documentation

## 2017-09-18 DIAGNOSIS — I10 Essential (primary) hypertension: Secondary | ICD-10-CM | POA: Diagnosis not present

## 2017-09-18 DIAGNOSIS — E1165 Type 2 diabetes mellitus with hyperglycemia: Secondary | ICD-10-CM | POA: Diagnosis not present

## 2017-09-18 DIAGNOSIS — E669 Obesity, unspecified: Secondary | ICD-10-CM | POA: Diagnosis not present

## 2017-09-18 DIAGNOSIS — R809 Proteinuria, unspecified: Secondary | ICD-10-CM | POA: Diagnosis not present

## 2017-09-18 DIAGNOSIS — E7800 Pure hypercholesterolemia, unspecified: Secondary | ICD-10-CM | POA: Diagnosis not present

## 2017-09-20 ENCOUNTER — Other Ambulatory Visit: Payer: Self-pay

## 2017-09-20 MED ORDER — CLOPIDOGREL BISULFATE 75 MG PO TABS: 75.0000 mg | ORAL_TABLET | Freq: Every day | ORAL | 0 refills | Status: DC

## 2017-09-22 DIAGNOSIS — R350 Frequency of micturition: Secondary | ICD-10-CM | POA: Diagnosis not present

## 2017-09-22 DIAGNOSIS — N390 Urinary tract infection, site not specified: Secondary | ICD-10-CM | POA: Diagnosis not present

## 2017-09-22 DIAGNOSIS — N898 Other specified noninflammatory disorders of vagina: Secondary | ICD-10-CM | POA: Diagnosis not present

## 2017-09-22 DIAGNOSIS — N760 Acute vaginitis: Secondary | ICD-10-CM | POA: Diagnosis not present

## 2017-09-25 ENCOUNTER — Ambulatory Visit (INDEPENDENT_AMBULATORY_CARE_PROVIDER_SITE_OTHER): Payer: Medicare Other | Admitting: *Deleted

## 2017-09-25 ENCOUNTER — Encounter: Payer: Self-pay | Admitting: *Deleted

## 2017-09-25 VITALS — Ht 67.0 in | Wt 204.0 lb

## 2017-09-25 DIAGNOSIS — Z0000 Encounter for general adult medical examination without abnormal findings: Secondary | ICD-10-CM | POA: Diagnosis not present

## 2017-09-25 NOTE — Patient Instructions (Signed)
  [name] , Thank you for taking time to come for your Medicare Wellness Visit. I appreciate your ongoing commitment to your health goals. Please review the following plan we discussed and let me know if I can assist you in the future.   These are the goals we discussed: Chair exercises daily. See handout.   This is a list of the screening recommended for you and due dates:  Health Maintenance  Topic Date Due  . Hemoglobin A1C  02/09/2018  . Complete foot exam   04/06/2018  . Eye exam for diabetics  07/04/2018  . Mammogram  09/26/2018  . DEXA scan (bone density measurement)  09/26/2018  . Pneumonia vaccines (2 of 2 - PPSV23) 01/22/2019  . Colon Cancer Screening  07/13/2020  . Tetanus Vaccine  04/15/2021  . Flu Shot  Completed  .  Hepatitis C: One time screening is recommended by Center for Disease Control  (CDC) for  adults born from [birth year] through 1965.   Completed

## 2017-09-25 NOTE — Progress Notes (Signed)
Subjective:   [name] is a 67 y.o. female who presents for an Initial Medicare Annual Wellness Visit.Patient lives in a two story home with her husband. They have 3 adult daughters and 7 grandchildren.  Chair exercises daily. See handout.  Review of Systems    Health is about the same as last year.   Cardiac Risk Factors include: advanced age (>18men, >53 women);hypertension;diabetes mellitus;dyslipidemia;obesity (BMI >30kg/m2);sedentary lifestyle     Objective:    Today's Vitals   09/25/17 1130 09/25/17 1134  Weight: 204 lb (92.5 kg)   Height: 5\' 7"  (1.702 m)   PainSc:  5    Body mass index is 31.95 kg/m.  Advanced Directives 09/25/2017 01/03/2017 10/03/2016 05/09/2016 07/14/2015 05/06/2015 02/23/2015  Does Patient Have a Medical Advance Directive? No No No No No No No  Would patient like information on creating a medical advance directive? No - Patient declined No - Patient declined - Yes - Scientist, clinical (histocompatibility and immunogenetics) given No - patient declined information Yes Higher education careers adviser given -  Pre-existing out of facility DNR order (yellow form or pink MOST form) - - - - - - -    Current Medications (verified) Outpatient Encounter Medications as of 09/25/2017  Medication Sig  . ACCU-CHEK AVIVA PLUS test strip USE TO CHECK BLOOD SUGAR UP TO TWICE DAILY OR AS INSTRUCTED  . ACCU-CHEK AVIVA PLUS test strip USE TO CHECK BLOOD SUGAR UP TO TWICE DAILY OR AS INSTRUCTED  . B-D INS SYR ULTRAFINE 1CC/31G 31G X 5/16" 1 ML MISC USE TO INJECT INSULIN TWICE A DAY AS INSTRUCTED  . Calcium Carbonate-Vitamin D (CALCIUM 600+D) 600-400 MG-UNIT per tablet Take 1 tablet by mouth 2 (two) times daily.  . cholecalciferol (VITAMIN D) 1000 UNITS tablet Take 1,000 Units by mouth daily.   . clopidogrel (PLAVIX) 75 MG tablet Take 1 tablet (75 mg total) by mouth daily.  . fenofibrate (TRICOR) 145 MG tablet Take 1 Tablet by mouth once daily  . Insulin Pen Needle 32G X 4 MM MISC Use to inject insulin with  insulin pen  . LIVALO 4 MG TABS Take 1 Tablet by mouth once daily  . losartan (COZAAR) 25 MG tablet Take 1 Tablet by mouth once daily  . meclizine (ANTIVERT) 12.5 MG tablet Take 1 tablet (12.5 mg total) by mouth 3 (three) times daily as needed for dizziness.  . metFORMIN (GLUCOPHAGE) 1000 MG tablet Take 1 Tablet by mouth 2 times a day  . Multiple Vitamin (MULTIVITAMIN WITH MINERALS) TABS tablet Take 1 tablet by mouth 2 (two) times daily.  . ondansetron (ZOFRAN ODT) 4 MG disintegrating tablet Take 1 tablet (4 mg total) by mouth every 8 (eight) hours as needed for nausea.  . sodium chloride (OCEAN) 0.65 % SOLN nasal spray Place 1 spray into both nostrils as needed for congestion.  Marland Kitchen NOVOLIN 70/30 RELION (70-30) 100 UNIT/ML injection   . [DISCONTINUED] Olmesartan-Amlodipine-HCTZ (TRIBENZOR) 40-5-12.5 MG TABS Take 1 tablet by mouth daily.     No facility-administered encounter medications on file as of 09/25/2017.     Allergies (verified) Atorvastatin; Crestor [rosuvastatin calcium]; Ezetimibe-simvastatin; Celebrex [celecoxib]; Levemir [insulin detemir]; and Penicillins   History: Past Medical History:  Diagnosis Date  . Abnormal Doppler ultrasound of carotid artery    a. 05/2013 - mild plaque, 1-39% BICA, abnormal appearrance of thyroid (TSH wnl in 06/2013).  Marland Kitchen CAD (coronary artery disease)    a. Nonobstructive by cath 2006 - 25% LAD, 25-30% after 1st diag, distal 25% PDA,  minor irreg LCx. b. Normal nuc 2011.  . Cataract    surgery  . Chronic kidney disease    stage 3  . Cystocele   . Difficult intubation 09-07-2012   big neck trouble with intubation parotid surgery"takes little med to sedate"  . Dyslipidemia   . Esophageal stricture   . GERD (gastroesophageal reflux disease)   . Hiatal hernia    a. 07/2013: HH with small stricture holding up barium tablet (concides with patient's sx of food sticking).  . Hyperlipidemia   . Hypertension    dr Percival Spanish  . IBS (irritable bowel  syndrome)   . 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 Memory difficulty 11/26/2015  . Morbid obesity ([place])   . Osteoarthritis   . Osteopenia   . Osteoporosis   . Palpitations    a. PVC/PAC on EKG 08/2013.  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  Marland Kitchen Pericardial effusion    a. Mod by echo 2012 at time of PNA. b. Echo 07/2012: small pericardial effusion vs fat.  Marland Kitchen PONV (postoperative nausea and vomiting)    n/v, and room spinning after cataract and parotid surgery  . Sleep apnea    CPAP currently broke, calling company about machine, pt does not know cpap settings, uses cpap off and on  . Stroke [facility]) 11-07-2014   tia  . Superficial phlebitis   . TIA (transient ischemic attack) 2016  . Toe ulcer ([place]) 01-08-14   Left big toe-being tx. at wound center "is healing"   Past Surgical History:  Procedure Laterality Date  . BREATH TEK H PYLORI N/A 07/29/2013   Procedure: BREATH TEK H PYLORI;  Surgeon: Shann Medal, MD;  Location: [facility];  Service: General;  Laterality: N/A;  . CARDIAC CATHETERIZATION     06  . CARPAL TUNNEL RELEASE  5 yrs ago   Left  . CATARACT EXTRACTION Bilateral yrs ago  . CHOLECYSTECTOMY  1982  . COLONOSCOPY N/A 07/14/2015   Procedure: COLONOSCOPY;  Surgeon: Ladene Artist, MD;  Location: [facility];  Service: Endoscopy;  Laterality: N/A;  . ESOPHAGOGASTRODUODENOSCOPY N/A 12/27/2013   Procedure: ESOPHAGOGASTRODUODENOSCOPY (EGD);  Surgeon: Shann Medal, MD;  Location: [facility];  Service: General;  Laterality: N/A;  . EYE SURGERY    . LAPAROSCOPIC GASTRIC SLEEVE RESECTION N/A 01/20/2014   Procedure: LAPAROSCOPIC GASTRIC SLEEVE RESECTION;  Surgeon: Shann Medal, MD;  Location: [facility];  Service: General;  Laterality: N/A;  . PAROTIDECTOMY  09/07/2012   Procedure: PAROTIDECTOMY;  Surgeon: Melida Quitter, MD;  Location: [facility];  Service: ENT;  Laterality: Left;  LEFT PAROTIDECTOMY  . TONSILLECTOMY  1968  . TUBAL  LIGATION  yrs ago  . UPPER GI ENDOSCOPY  01/20/2014   Procedure: UPPER GI ENDOSCOPY;  Surgeon: Shann Medal, MD;  Location: [facility];  Service: General;;   Family History  Problem Relation Age of Onset  . Heart failure Mother   . Hypertension Mother   . Skin cancer Mother   . Colitis Mother   . Diabetes Mother   . Kidney Stones Mother   . Stroke Mother   . Heart disease Father   . Colon polyps Father   . Diabetes Father   . Heart failure Father   . Heart attack Sister   . Diabetes Other   . Diabetes Daughter   . Stroke Maternal Grandfather   . Diabetes Maternal Grandfather   .  Colon cancer Neg Hx    Social History   Socioeconomic History  . Marital status: Married    Spouse name: Not on file  . Number of children: 3  . Years of education: some college  . Highest education level: Some college, no degree  Social Needs  . Financial resource strain: Not hard at all  . Food insecurity - worry: Never true  . Food insecurity - inability: Never true  . Transportation needs - medical: No  . Transportation needs - non-medical: No  Occupational History  . Occupation: retired    Fish farm manager: [employer]  Tobacco Use  . Smoking status: Never Smoker  . Smokeless tobacco: Never Used  Substance and Sexual Activity  . Alcohol use: Yes    Alcohol/week: 0.0 oz    Comment: rarely  . Drug use: Yes    Types: Other-see comments  . Sexual activity: Yes    Birth control/protection: Post-menopausal  Other Topics Concern  . Not on file  Social History Narrative   Married   Patient is right handed.   Patient rarely drinks caffeine.   Lives at home with husband in two story home. Husband washes laundry in the basement. They have 3 daughters and 7 grandchildren.     Tobacco Counseling No tobacco use  Clinical Intake:     Pain : 0-10 Pain Score: 5  Pain Type: Chronic pain Pain Location: Foot Pain Descriptors / Indicators: Aching Pain Onset: More than a month ago Pain  Frequency: Intermittent Effect of Pain on Daily Activities: minimal     Nutritional Status: BMI 25 -29 Overweight Nutritional Risks: Non-healing wound Diabetes: Yes CBG done?: No Did pt. bring in CBG monitor from home?: No  How often do you need to have someone help you when you read instructions, pamphlets, or other written materials from your doctor or pharmacy?: 1 - Never What is the last grade level you completed in school?: some college  Interpreter Needed?: No  Information entered by :: Chong Sicilian, RN   Activities of Daily Living In your present state of health, do you have any difficulty performing the following activities: 09/25/2017  Hearing? Y  Comment Has some trouble with left ear popping/cracking  Vision? N  Comment Eye exam is up to date  Difficulty concentrating or making decisions? Y  Comment Difficulty remembering names  Walking or climbing stairs? Y  Comment left toe amputation  Dressing or bathing? N  Doing errands, shopping? N  Preparing Food and eating ? N  Using the Toilet? N  In the past six months, have you accidently leaked urine? N  Do you have problems with loss of bowel control? N  Managing your Medications? N  Managing your Finances? N  Housekeeping or managing your Housekeeping? N  Comment Husband does laundry in the basement  Some recent data might be hidden     Immunizations and Health Maintenance Immunization History  Administered Date(s) Administered  . Influenza Split 05/16/2011  . Influenza Whole 05/15/2010  . Influenza, High Dose Seasonal PF 05/09/2016, 07/10/2017  . Influenza,inj,Quad PF,6+ Mos 05/22/2013, 06/03/2014, 05/14/2015  . Pneumococcal Conjugate-13 05/09/2016  . Pneumococcal Polysaccharide-23 01/21/2014   There are no preventive care reminders to display for this patient.  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 No other surgeries or hosp admissions last year.   Assessment:   This is a routine wellness examination for [name].  Hearing/Vision screen No deficits noted during visit.   Dietary issues and exercise activities discussed: Current Exercise Habits: The patient does not participate in regular exercise at present, Exercise limited by: orthopedic condition(s)  Goals    . Exercise 150 min/wk Moderate Activity     Chair exercises daily. See handout       Depression Screen PHQ 2/9 Scores 09/25/2017 08/11/2017 04/06/2017 11/17/2016 10/31/2016 10/06/2016 07/05/2016  PHQ - 2 Score 0 0 0 0 0 0 0    Fall Risk Fall Risk  09/25/2017 08/11/2017 04/06/2017 11/17/2016 10/31/2016  Falls in the past year? No No No No Yes  Number falls in past yr: - - - - 1  Injury with Fall? - - - - Yes  Comment - - - - -  Follow up - - - - -   Cognitive Function: MMSE - Mini Mental State Exam 09/25/2017 07/03/2017 12/01/2016 07/25/2016 05/09/2016  Orientation to time 5 4 4 4 4   Orientation to Place 5 5 5 5 5   Registration 3 3 3 3 3   Attention/ Calculation 5 5 5 5 4   Recall 2 3 3 3 2   Language- name 2 objects 2 2 2 2 2   Language- repeat 1 1 1 1 1   Language- follow 3 step command 3 3 3 3 3   Language- read & follow direction 1 1 1 1 1   Write a sentence 1 1 1 1 1   Copy design 0 1 1 1 1   Total score 28 29 29 29 27         Screening Tests Health Maintenance  Topic Date Due  . HEMOGLOBIN A1C  02/09/2018  . FOOT EXAM  04/06/2018  . OPHTHALMOLOGY EXAM  07/04/2018  . MAMMOGRAM  09/26/2018  . DEXA SCAN   09/26/2018  . PNA vac Low Risk Adult (2 of 2 - PPSV23) 01/22/2019  . COLONOSCOPY  07/13/2020  . TETANUS/TDAP  04/15/2021  . INFLUENZA VACCINE  Completed  . Hepatitis C Screening  Completed      Plan:  Chair exercises daily. Handout given. Keep f/u with PCP and specialists Move carefully to avoid falls. Use cane or walker for balance  I have personally reviewed and noted the following in the patient's chart:   . Medical and social history . Use of alcohol, tobacco or illicit drugs  . Current medications and supplements . Functional ability and status . Nutritional status . Physical activity . Advanced directives . List of other physicians . Hospitalizations, surgeries, and ER visits in previous 12 months . Vitals . Screenings to include cognitive, depression, and falls . Referrals and appointments  In addition, I have reviewed and discussed with patient certain preventive protocols, quality metrics, and best practice recommendations. A written personalized care plan for preventive services as well as general preventive health recommendations were provided to patient.     Chong Sicilian, RN   09/25/2017   I have reviewed and agree with the above AWV documentation.   Arrie Senate MD

## 2017-10-02 ENCOUNTER — Other Ambulatory Visit: Payer: Self-pay

## 2017-10-02 MED ORDER — LOSARTAN POTASSIUM 25 MG PO TABS: 25.0000 mg | ORAL_TABLET | Freq: Every day | ORAL | 0 refills | Status: DC

## 2017-10-07 ENCOUNTER — Other Ambulatory Visit: Payer: Self-pay | Admitting: Family Medicine

## 2017-10-16 DIAGNOSIS — Z1231 Encounter for screening mammogram for malignant neoplasm of breast: Secondary | ICD-10-CM | POA: Diagnosis not present

## 2017-10-26 DIAGNOSIS — Z89412 Acquired absence of left great toe: Secondary | ICD-10-CM | POA: Diagnosis not present

## 2017-10-26 DIAGNOSIS — E1142 Type 2 diabetes mellitus with diabetic polyneuropathy: Secondary | ICD-10-CM | POA: Diagnosis not present

## 2017-10-26 DIAGNOSIS — T8189XD Other complications of procedures, not elsewhere classified, subsequent encounter: Secondary | ICD-10-CM | POA: Diagnosis not present

## 2017-10-26 DIAGNOSIS — I739 Peripheral vascular disease, unspecified: Secondary | ICD-10-CM | POA: Diagnosis not present

## 2017-10-26 DIAGNOSIS — L97522 Non-pressure chronic ulcer of other part of left foot with fat layer exposed: Secondary | ICD-10-CM | POA: Diagnosis not present

## 2017-11-27 DIAGNOSIS — E1142 Type 2 diabetes mellitus with diabetic polyneuropathy: Secondary | ICD-10-CM | POA: Diagnosis not present

## 2017-11-27 DIAGNOSIS — T8189XD Other complications of procedures, not elsewhere classified, subsequent encounter: Secondary | ICD-10-CM | POA: Diagnosis not present

## 2017-11-27 DIAGNOSIS — I739 Peripheral vascular disease, unspecified: Secondary | ICD-10-CM | POA: Diagnosis not present

## 2017-11-27 DIAGNOSIS — L97522 Non-pressure chronic ulcer of other part of left foot with fat layer exposed: Secondary | ICD-10-CM | POA: Diagnosis not present

## 2017-11-27 DIAGNOSIS — I10 Essential (primary) hypertension: Secondary | ICD-10-CM | POA: Diagnosis not present

## 2017-12-13 ENCOUNTER — Other Ambulatory Visit: Payer: Self-pay | Admitting: Family Medicine

## 2017-12-13 NOTE — Telephone Encounter (Signed)
Last seen 08/11/17  DWM

## 2017-12-14 ENCOUNTER — Ambulatory Visit (INDEPENDENT_AMBULATORY_CARE_PROVIDER_SITE_OTHER): Payer: Medicare Other | Admitting: Family Medicine

## 2017-12-14 ENCOUNTER — Encounter: Payer: Self-pay | Admitting: Family Medicine

## 2017-12-14 VITALS — BP 140/69 | HR 95 | Temp 97.8°F | Ht 67.0 in | Wt 206.0 lb

## 2017-12-14 DIAGNOSIS — Z89412 Acquired absence of left great toe: Secondary | ICD-10-CM | POA: Diagnosis not present

## 2017-12-14 DIAGNOSIS — E1151 Type 2 diabetes mellitus with diabetic peripheral angiopathy without gangrene: Secondary | ICD-10-CM

## 2017-12-14 DIAGNOSIS — N183 Chronic kidney disease, stage 3 unspecified: Secondary | ICD-10-CM

## 2017-12-14 DIAGNOSIS — E1122 Type 2 diabetes mellitus with diabetic chronic kidney disease: Secondary | ICD-10-CM | POA: Diagnosis not present

## 2017-12-14 DIAGNOSIS — I739 Peripheral vascular disease, unspecified: Secondary | ICD-10-CM

## 2017-12-14 DIAGNOSIS — E7800 Pure hypercholesterolemia, unspecified: Secondary | ICD-10-CM

## 2017-12-14 DIAGNOSIS — R7989 Other specified abnormal findings of blood chemistry: Secondary | ICD-10-CM | POA: Diagnosis not present

## 2017-12-14 DIAGNOSIS — I10 Essential (primary) hypertension: Secondary | ICD-10-CM

## 2017-12-14 DIAGNOSIS — Z9884 Bariatric surgery status: Secondary | ICD-10-CM

## 2017-12-14 DIAGNOSIS — E1142 Type 2 diabetes mellitus with diabetic polyneuropathy: Secondary | ICD-10-CM | POA: Diagnosis not present

## 2017-12-14 DIAGNOSIS — R195 Other fecal abnormalities: Secondary | ICD-10-CM | POA: Diagnosis not present

## 2017-12-14 LAB — BAYER DCA HB A1C WAIVED: HB A1C (BAYER DCA - WAIVED): 9.8 % — ABNORMAL HIGH (ref <7.0)

## 2017-12-14 NOTE — Progress Notes (Signed)
Subjective:    Patient ID: [name], female    DOB: [birth year], 67 y.o.   MRN: [id number]  HPI Pt here for follow up and management of chronic medical problems which includes diabetes, hypertension and hyperlipidemia. She Is taking medication regularly.  Patient is having trouble with loose bowel movements and indigestion.  She does complain of some discomfort in her left chest and seems to associate relief of this with belching.  She also says that she has had some problems with loose bowel movements but with each bowel movement it is loose initially and then becomes firmed up as the bowel movement continues.  She was asked to come back and see the surgeon who did her gastric bypass and she plans to set up an appointment to do this and I would encourage her to do this so that he can follow-up on these problems.  She denies any blood in the stool or black tarry bowel movements.  She denies any chest pain or shortness of breath.  She denies any trouble with passing her water.  She has had some slight problems with recovering from the amputation of a toe on the left foot but seems to be getting better with this also.  She will call Dr. Lucia Gaskins herself to set up an appointment for follow-up of the gastric bypass issues that she may be having.  She sees the cardiologist yearly and has already seen him this year.     Patient Active Problem List   Diagnosis Date Noted  . Diabetic retinopathy ([place]) 09/17/2017  . Nonrheumatic aortic valve stenosis 02/13/2017  . Palpitations 02/13/2017  . Memory difficulty 11/26/2015  . Benign neoplasm of sigmoid colon 07/14/2015  . Low serum vitamin D 05/06/2015  . Vertigo 05/06/2015  . Aphasia 12/08/2014  . TIA (transient ischemic attack) 11/07/2014  . 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 CKD stage 3 due to type 2 diabetes mellitus ([place]) 06/03/2014  . Type II diabetes mellitus with renal manifestations ([place]) 06/03/2014  . Type II diabetes mellitus with  peripheral circulatory disorder ([place]) 06/03/2014  . Obesity (BMI 30.0-34.9) 04/24/2014  . Dizziness and giddiness 04/14/2014  . 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 Osteopenia 10/09/2013  . Abnormal ankle brachial index 09/11/2013  . Hypomagnesemia 08/01/2013  . DYSPNEA 08/18/2010  . MYOCARDIAL PERFUSION SCAN, WITH STRESS TEST, ABNORMAL 08/04/2010  . ELECTROCARDIOGRAM, ABNORMAL 07/28/2010  . Edema extremities 12/30/2008  . Hyperlipidemia 12/26/2008  . Benign essential HTN 12/26/2008  . CAD 12/26/2008  . PERICARDIAL EFFUSION 12/26/2008  . GERD 12/26/2008  . OSTEOARTHRITIS 12/26/2008   Outpatient Encounter Medications as of 12/14/2017  Medication Sig  . ACCU-CHEK AVIVA PLUS test strip USE TO CHECK BLOOD SUGAR UP TO TWICE DAILY OR AS INSTRUCTED  . B-D INS SYR ULTRAFINE 1CC/31G 31G X 5/16" 1 ML MISC USE TO INJECT INSULIN TWICE A DAY AS INSTRUCTED  . Calcium Carbonate-Vitamin D (CALCIUM 600+D) 600-400 MG-UNIT per tablet Take 1 tablet by mouth 2 (two) times daily.  . cholecalciferol (VITAMIN D) 1000 UNITS tablet Take 1,000 Units by mouth daily.   . clopidogrel (PLAVIX) 75 MG tablet TAKE 1 TABLET BY MOUTH EVERY DAY  . fenofibrate (TRICOR) 145 MG tablet Take 1 Tablet by mouth once daily  . Insulin Pen Needle 32G X 4 MM MISC Use to inject insulin with insulin pen  . LIVALO 4 MG TABS TAKE 1 TABLET BY MOUTH ONCE DAILY  . losartan (COZAAR) 25 MG tablet Take 1 tablet (  25 mg total) by mouth daily.  . meclizine (ANTIVERT) 12.5 MG tablet Take 1 tablet (12.5 mg total) by mouth 3 (three) times daily as needed for dizziness.  . metFORMIN (GLUCOPHAGE) 1000 MG tablet Take 1 Tablet by mouth 2 times a day  . Multiple Vitamin (MULTIVITAMIN WITH MINERALS) TABS tablet Take 1 tablet by mouth 2 (two) times daily.  Marland Kitchen NOVOLIN 70/30 RELION (70-30) 100 UNIT/ML injection   . ondansetron (ZOFRAN ODT) 4 MG disintegrating tablet Take 1 tablet (4 mg total) by mouth every 8 (eight) hours as needed for  nausea.  . sodium chloride (OCEAN) 0.65 % SOLN nasal spray Place 1 spray into both nostrils as needed for congestion.  . [DISCONTINUED] ACCU-CHEK AVIVA PLUS test strip USE TO CHECK BLOOD SUGAR UP TO TWICE DAILY OR AS INSTRUCTED  . [DISCONTINUED] Olmesartan-Amlodipine-HCTZ (TRIBENZOR) 40-5-12.5 MG TABS Take 1 tablet by mouth daily.     No facility-administered encounter medications on file as of 12/14/2017.      Review of Systems  Constitutional: Negative.   HENT: Negative.   Eyes: Negative.   Respiratory: Negative.   Cardiovascular: Negative.   Gastrointestinal: Positive for diarrhea.       Indigestion   Endocrine: Negative.   Genitourinary: Negative.   Musculoskeletal: Negative.   Skin: Negative.   Allergic/Immunologic: Negative.   Neurological: Negative.   Hematological: Negative.   Psychiatric/Behavioral: Negative.        Objective:   Physical Exam  Constitutional: She is oriented to person, place, and time. She appears well-developed and well-nourished.  The patient is pleasant and alert and doing her best with taking care of herself.  HENT:  Head: Normocephalic and atraumatic.  Right Ear: External ear normal.  Left Ear: External ear normal.  Nose: Nose normal.  Mouth/Throat: Oropharynx is clear and moist.  Eyes: Pupils are equal, round, and reactive to light. Conjunctivae and EOM are normal. Right eye exhibits no discharge. Left eye exhibits no discharge. No scleral icterus.  Eye exam is planned for tomorrow at my eye doctor.  Neck: Normal range of motion. Neck supple. No JVD present. No thyromegaly present.  Right carotid bruit which is followed by the cardiologist.  No thyromegaly or anterior cervical adenopathy  Cardiovascular: Normal rate, regular rhythm and normal heart sounds.  No murmur heard. Heart is regular at 84/min, difficult to palpate pulses bilaterally.  Pulmonary/Chest: Effort normal and breath sounds normal. No respiratory distress. She has no wheezes.  She has no rales.  Abdominal: Soft. Bowel sounds are normal. She exhibits no distension and no mass. There is no tenderness. There is no rebound and no guarding.  Abdominal obesity without liver or spleen enlargement tenderness or masses.  Musculoskeletal: She exhibits tenderness. She exhibits no edema or deformity.  The patient continues to have problems with her back with specific movements like laying down on the table and sitting up from the table and needs assistance with this.  Lymphadenopathy:    She has no cervical adenopathy.  Neurological: She is alert and oriented to person, place, and time. She has normal reflexes. No cranial nerve deficit.  Skin: Skin is warm and dry. Rash noted. No erythema.  Patient has pressure sore left lateral foot which is continue to be followed by the surgeon that did her toe amputation.  Psychiatric: She has a normal mood and affect. Her behavior is normal. Judgment and thought content normal.  Nursing note and vitals reviewed.   BP (!) 157/76 (BP Location: Right Arm)  Pulse 95   Temp 97.8 F (36.6 C) (Oral)   Ht 5' 7"  (1.702 m)   Wt 206 lb (93.4 kg)   BMI 32.26 kg/m        Assessment & Plan:  1. Type II diabetes mellitus with peripheral circulatory disorder (HCC) -Continue aggressive therapeutic lifestyle changes and current treatment - BMP8+EGFR - CBC with Differential/Platelet - Bayer DCA Hb A1c Waived  2. Pure hypercholesterolemia -Continue current treatment pending results of lab work and follow-up with cardiology as planned - CBC with Differential/Platelet - Lipid panel  3. Chronic kidney disease, stage 3 (moderate) (HCC) -Continue to avoid all NSAIDs and keep blood pressure under the best control possible - BMP8+EGFR - CBC with Differential/Platelet  4. Benign essential HTN -Initial blood pressure was slightly elevated but repeat blood pressure was lower.  She will continue current treatment. - BMP8+EGFR - CBC with  Differential/Platelet - Hepatic function panel  5. Low serum vitamin D -Continue current treatment pending results of lab work - CBC with Differential/Platelet - VITAMIN D 25 Hydroxy (Vit-D Deficiency, Fractures)  6. History of gastric bypass -Because of changes in bowel habits and periodic episodes of left chest discomfort which seems to be associated with belching the patient will follow up with the surgeon who did her gastric bypass just to make sure that that is not playing a role with some of these symptoms. - CBC with Differential/Platelet  7. Loose bowel movements -Follow-up with gastric bypass surgeon as planned  8. Status post amputation of left great toe ([place]) -Follow-up with foot surgeon as planned  9. CKD stage 3 due to type 2 diabetes mellitus ([place]) -Continue to avoid all NSAIDs and keep blood pressure under best control possible as well as blood sugar under best control possible  10. Peripheral artery disease (HCC) -Check feet regularly and walk as much as possible and use cane to help maintain good gait stability  11. Type 2 diabetes mellitus with peripheral neuropathy (HCC) -Sensation in feet was intact today.  Patient Instructions                       Medicare Annual Wellness Visit  [facility] and the medical providers at [facility] strive to bring you the best medical care.  In doing so we not only want to address your current medical conditions and concerns but also to detect new conditions early and prevent illness, disease and health-related problems.    Medicare offers a yearly Wellness Visit which allows our clinical staff to assess your need for preventative services including immunizations, lifestyle education, counseling to decrease risk of preventable diseases and screening for fall risk and other medical concerns.    This visit is provided free of charge (no copay) for all Medicare recipients. The clinical pharmacists at [facility] have begun to conduct these Wellness Visits which will also include a thorough review of all your medications.    As you primary medical provider recommend that you make an appointment for your Annual Wellness Visit if you have not done so already this year.  You may set up this appointment before you leave today or you may call back ([phone]) and schedule an appointment.  Please make sure when you call that you mention that you are scheduling your Annual Wellness Visit with the clinical pharmacist so that the appointment may be made for the proper length of time.     Continue current medications. Continue  good therapeutic lifestyle changes which include good diet and exercise. Fall precautions discussed with patient. If an FOBT was given today- please return it to our front desk. If you are over 32 years old - you may need Prevnar 8 or the adult Pneumonia vaccine.  **Flu shots are available--- please call and schedule a FLU-CLINIC appointment**  After your visit with [place] today you will receive a survey in the mail or online from [employer] regarding your care with [place]. Please take a moment to fill this out. Your feedback is very important to [place] as you can help [place] better understand your patient needs as well as improve your experience and satisfaction. WE CARE ABOUT YOU!!!   Please call Dr. Lucia Gaskins and set up appointment for follow-up as his office has requested Continue to follow-up with cardiology yearly Continue to follow-up with surgeon regarding toe amputation.  Arrie Senate MD

## 2017-12-14 NOTE — Patient Instructions (Addendum)
Medicare Annual Wellness Visit  [facility] and the medical providers at [facility] strive to bring you the best medical care.  In doing so we not only want to address your current medical conditions and concerns but also to detect new conditions early and prevent illness, disease and health-related problems.    Medicare offers a yearly Wellness Visit which allows our clinical staff to assess your need for preventative services including immunizations, lifestyle education, counseling to decrease risk of preventable diseases and screening for fall risk and other medical concerns.    This visit is provided free of charge (no copay) for all Medicare recipients. The clinical pharmacists at [facility] have begun to conduct these Wellness Visits which will also include a thorough review of all your medications.    As you primary medical provider recommend that you make an appointment for your Annual Wellness Visit if you have not done so already this year.  You may set up this appointment before you leave today or you may call back ([phone]) and schedule an appointment.  Please make sure when you call that you mention that you are scheduling your Annual Wellness Visit with the clinical pharmacist so that the appointment may be made for the proper length of time.     Continue current medications. Continue good therapeutic lifestyle changes which include good diet and exercise. Fall precautions discussed with patient. If an FOBT was given today- please return it to our front desk. If you are over 6 years old - you may need Prevnar 8 or the adult Pneumonia vaccine.  **Flu shots are available--- please call and schedule a FLU-CLINIC appointment**  After your visit with [place] today you will receive a survey in the mail or online from [employer] regarding your care with [place]. Please take a moment to fill this out. Your feedback is very  important to [place] as you can help [place] better understand your patient needs as well as improve your experience and satisfaction. WE CARE ABOUT YOU!!!   Please call Dr. Lucia Gaskins and set up appointment for follow-up as his office has requested Continue to follow-up with cardiology yearly Continue to follow-up with surgeon regarding toe amputation.

## 2017-12-15 DIAGNOSIS — D3131 Benign neoplasm of right choroid: Secondary | ICD-10-CM | POA: Diagnosis not present

## 2017-12-15 DIAGNOSIS — Z794 Long term (current) use of insulin: Secondary | ICD-10-CM | POA: Diagnosis not present

## 2017-12-15 DIAGNOSIS — Z7984 Long term (current) use of oral hypoglycemic drugs: Secondary | ICD-10-CM | POA: Diagnosis not present

## 2017-12-15 DIAGNOSIS — E113293 Type 2 diabetes mellitus with mild nonproliferative diabetic retinopathy without macular edema, bilateral: Secondary | ICD-10-CM | POA: Diagnosis not present

## 2017-12-15 LAB — CBC WITH DIFFERENTIAL/PLATELET
Basophils Absolute: 0.1 10*3/uL (ref 0.0–0.2)
Basos: 1 %
EOS (ABSOLUTE): 0.3 10*3/uL (ref 0.0–0.4)
Eos: 5 %
Hematocrit: 39.8 % (ref 34.0–46.6)
Hemoglobin: 13.3 g/dL (ref 11.1–15.9)
Immature Grans (Abs): 0 10*3/uL (ref 0.0–0.1)
Immature Granulocytes: 0 %
Lymphocytes Absolute: 2.4 10*3/uL (ref 0.7–3.1)
Lymphs: 33 %
MCH: 27.8 pg (ref 26.6–33.0)
MCHC: 33.4 g/dL (ref 31.5–35.7)
MCV: 83 fL (ref 79–97)
Monocytes Absolute: 0.4 10*3/uL (ref 0.1–0.9)
Monocytes: 6 %
Neutrophils Absolute: 4.2 10*3/uL (ref 1.4–7.0)
Neutrophils: 55 %
Platelets: 264 10*3/uL (ref 150–379)
RBC: 4.78 x10E6/uL (ref 3.77–5.28)
RDW: 15.8 % — ABNORMAL HIGH (ref 12.3–15.4)
WBC: 7.4 10*3/uL (ref 3.4–10.8)

## 2017-12-15 LAB — BMP8+EGFR
BUN/Creatinine Ratio: 23 (ref 12–28)
BUN: 34 mg/dL — ABNORMAL HIGH (ref 8–27)
CO2: 23 mmol/L (ref 20–29)
Calcium: 9.4 mg/dL (ref 8.7–10.3)
Chloride: 105 mmol/L (ref 96–106)
Creatinine, Ser: 1.49 mg/dL — ABNORMAL HIGH (ref 0.57–1.00)
GFR calc Af Amer: 42 mL/min/{1.73_m2} — ABNORMAL LOW (ref >59)
GFR calc non Af Amer: 36 mL/min/{1.73_m2} — ABNORMAL LOW (ref >59)
Glucose: 235 mg/dL — ABNORMAL HIGH (ref 65–99)
Potassium: 5 mmol/L (ref 3.5–5.2)
Sodium: 144 mmol/L (ref 134–144)

## 2017-12-15 LAB — LIPID PANEL
Chol/HDL Ratio: 4.1 ratio (ref 0.0–4.4)
Cholesterol, Total: 186 mg/dL (ref 100–199)
HDL: 45 mg/dL (ref >39)
LDL Calculated: 97 mg/dL (ref 0–99)
Triglycerides: 220 mg/dL — ABNORMAL HIGH (ref 0–149)
VLDL Cholesterol Cal: 44 mg/dL — ABNORMAL HIGH (ref 5–40)

## 2017-12-15 LAB — HEPATIC FUNCTION PANEL
ALT: 14 IU/L (ref 0–32)
AST: 12 IU/L (ref 0–40)
Albumin: 4.2 g/dL (ref 3.6–4.8)
Alkaline Phosphatase: 39 IU/L (ref 39–117)
Bilirubin Total: 0.4 mg/dL (ref 0.0–1.2)
Bilirubin, Direct: 0.15 mg/dL (ref 0.00–0.40)
Total Protein: 6.5 g/dL (ref 6.0–8.5)

## 2017-12-15 LAB — VITAMIN D 25 HYDROXY (VIT D DEFICIENCY, FRACTURES): Vit D, 25-Hydroxy: 28.4 ng/mL — ABNORMAL LOW (ref 30.0–100.0)

## 2017-12-15 LAB — HM DIABETES EYE EXAM

## 2017-12-19 ENCOUNTER — Telehealth: Payer: Self-pay | Admitting: Family Medicine

## 2017-12-19 MED ORDER — AZITHROMYCIN 250 MG PO TABS: ORAL_TABLET | ORAL | 0 refills | Status: DC

## 2017-12-19 NOTE — Telephone Encounter (Signed)
Please do a Z-Pak and take as directed

## 2017-12-19 NOTE — Telephone Encounter (Signed)
Patient is concerned she may have strep throat, grandson has been diagnosed and she has been around him.  She is feeling weak, has severe congestion and sinus drainage.  Would like to know if you will send an antibiotic to [facility], please advise.

## 2017-12-19 NOTE — Telephone Encounter (Signed)
BUSY # for pt - med sent to [facility]

## 2017-12-26 ENCOUNTER — Other Ambulatory Visit: Payer: Self-pay | Admitting: Family Medicine

## 2018-01-01 ENCOUNTER — Other Ambulatory Visit: Payer: Self-pay | Admitting: Family Medicine

## 2018-01-15 DIAGNOSIS — I10 Essential (primary) hypertension: Secondary | ICD-10-CM | POA: Diagnosis not present

## 2018-01-15 DIAGNOSIS — R809 Proteinuria, unspecified: Secondary | ICD-10-CM | POA: Diagnosis not present

## 2018-01-15 DIAGNOSIS — E7800 Pure hypercholesterolemia, unspecified: Secondary | ICD-10-CM | POA: Diagnosis not present

## 2018-01-15 DIAGNOSIS — E1165 Type 2 diabetes mellitus with hyperglycemia: Secondary | ICD-10-CM | POA: Diagnosis not present

## 2018-01-15 DIAGNOSIS — E669 Obesity, unspecified: Secondary | ICD-10-CM | POA: Diagnosis not present

## 2018-01-16 DIAGNOSIS — Z89412 Acquired absence of left great toe: Secondary | ICD-10-CM | POA: Diagnosis not present

## 2018-01-16 DIAGNOSIS — I739 Peripheral vascular disease, unspecified: Secondary | ICD-10-CM | POA: Diagnosis not present

## 2018-01-16 DIAGNOSIS — T8189XD Other complications of procedures, not elsewhere classified, subsequent encounter: Secondary | ICD-10-CM | POA: Diagnosis not present

## 2018-01-16 DIAGNOSIS — E1142 Type 2 diabetes mellitus with diabetic polyneuropathy: Secondary | ICD-10-CM | POA: Diagnosis not present

## 2018-01-16 DIAGNOSIS — L97522 Non-pressure chronic ulcer of other part of left foot with fat layer exposed: Secondary | ICD-10-CM | POA: Diagnosis not present

## 2018-02-03 ENCOUNTER — Other Ambulatory Visit: Payer: Self-pay | Admitting: Family Medicine

## 2018-02-12 ENCOUNTER — Other Ambulatory Visit: Payer: Medicare Other

## 2018-02-12 DIAGNOSIS — Z1211 Encounter for screening for malignant neoplasm of colon: Secondary | ICD-10-CM

## 2018-02-12 NOTE — Addendum Note (Signed)
Addended by: Liliane Bade on: 02/12/2018 05:49 PM   Modules accepted: Orders

## 2018-02-13 LAB — FECAL OCCULT BLOOD, IMMUNOCHEMICAL: Fecal Occult Bld: NEGATIVE

## 2018-02-14 ENCOUNTER — Encounter (HOSPITAL_COMMUNITY): Payer: Self-pay | Admitting: General Practice

## 2018-02-14 ENCOUNTER — Inpatient Hospital Stay (HOSPITAL_COMMUNITY)
Admission: EM | Admit: 2018-02-14 | Discharge: 2018-02-19 | DRG: 309 | Disposition: A | Discharge status: Home / Self Care | Payer: Medicare Other | Attending: Cardiovascular Disease | Admitting: Cardiovascular Disease

## 2018-02-14 ENCOUNTER — Other Ambulatory Visit: Payer: Self-pay

## 2018-02-14 ENCOUNTER — Emergency Department (HOSPITAL_COMMUNITY): Payer: Medicare Other

## 2018-02-14 DIAGNOSIS — K449 Diaphragmatic hernia without obstruction or gangrene: Secondary | ICD-10-CM | POA: Diagnosis present

## 2018-02-14 DIAGNOSIS — K219 Gastro-esophageal reflux disease without esophagitis: Secondary | ICD-10-CM | POA: Diagnosis present

## 2018-02-14 DIAGNOSIS — E1129 Type 2 diabetes mellitus with other diabetic kidney complication: Secondary | ICD-10-CM | POA: Diagnosis present

## 2018-02-14 DIAGNOSIS — I10 Essential (primary) hypertension: Secondary | ICD-10-CM | POA: Diagnosis present

## 2018-02-14 DIAGNOSIS — R079 Chest pain, unspecified: Secondary | ICD-10-CM

## 2018-02-14 DIAGNOSIS — I313 Pericardial effusion (noninflammatory): Secondary | ICD-10-CM | POA: Diagnosis not present

## 2018-02-14 DIAGNOSIS — I209 Angina pectoris, unspecified: Secondary | ICD-10-CM | POA: Diagnosis not present

## 2018-02-14 DIAGNOSIS — I2510 Atherosclerotic heart disease of native coronary artery without angina pectoris: Secondary | ICD-10-CM | POA: Diagnosis present

## 2018-02-14 DIAGNOSIS — I4892 Unspecified atrial flutter: Secondary | ICD-10-CM | POA: Clinically undetermined

## 2018-02-14 DIAGNOSIS — I959 Hypotension, unspecified: Secondary | ICD-10-CM | POA: Diagnosis not present

## 2018-02-14 DIAGNOSIS — K589 Irritable bowel syndrome without diarrhea: Secondary | ICD-10-CM | POA: Diagnosis present

## 2018-02-14 DIAGNOSIS — N183 Chronic kidney disease, stage 3 (moderate): Secondary | ICD-10-CM | POA: Diagnosis present

## 2018-02-14 DIAGNOSIS — I483 Typical atrial flutter: Secondary | ICD-10-CM | POA: Diagnosis not present

## 2018-02-14 DIAGNOSIS — Z8249 Family history of ischemic heart disease and other diseases of the circulatory system: Secondary | ICD-10-CM

## 2018-02-14 DIAGNOSIS — Z7982 Long term (current) use of aspirin: Secondary | ICD-10-CM

## 2018-02-14 DIAGNOSIS — I481 Persistent atrial fibrillation: Secondary | ICD-10-CM | POA: Diagnosis not present

## 2018-02-14 DIAGNOSIS — G4733 Obstructive sleep apnea (adult) (pediatric): Secondary | ICD-10-CM | POA: Diagnosis not present

## 2018-02-14 DIAGNOSIS — G459 Transient cerebral ischemic attack, unspecified: Secondary | ICD-10-CM | POA: Diagnosis present

## 2018-02-14 DIAGNOSIS — Z6832 Body mass index (BMI) 32.0-32.9, adult: Secondary | ICD-10-CM

## 2018-02-14 DIAGNOSIS — E785 Hyperlipidemia, unspecified: Secondary | ICD-10-CM | POA: Diagnosis not present

## 2018-02-14 DIAGNOSIS — I350 Nonrheumatic aortic (valve) stenosis: Secondary | ICD-10-CM | POA: Diagnosis present

## 2018-02-14 DIAGNOSIS — M1990 Unspecified osteoarthritis, unspecified site: Secondary | ICD-10-CM | POA: Diagnosis present

## 2018-02-14 DIAGNOSIS — I499 Cardiac arrhythmia, unspecified: Secondary | ICD-10-CM | POA: Diagnosis not present

## 2018-02-14 DIAGNOSIS — M810 Age-related osteoporosis without current pathological fracture: Secondary | ICD-10-CM | POA: Diagnosis present

## 2018-02-14 DIAGNOSIS — I6529 Occlusion and stenosis of unspecified carotid artery: Secondary | ICD-10-CM | POA: Diagnosis present

## 2018-02-14 DIAGNOSIS — I4891 Unspecified atrial fibrillation: Secondary | ICD-10-CM | POA: Diagnosis not present

## 2018-02-14 DIAGNOSIS — Z8673 Personal history of transient ischemic attack (TIA), and cerebral infarction without residual deficits: Secondary | ICD-10-CM

## 2018-02-14 DIAGNOSIS — I129 Hypertensive chronic kidney disease with stage 1 through stage 4 chronic kidney disease, or unspecified chronic kidney disease: Secondary | ICD-10-CM | POA: Diagnosis present

## 2018-02-14 DIAGNOSIS — Z049 Encounter for examination and observation for unspecified reason: Secondary | ICD-10-CM | POA: Diagnosis not present

## 2018-02-14 DIAGNOSIS — Z888 Allergy status to other drugs, medicaments and biological substances status: Secondary | ICD-10-CM

## 2018-02-14 DIAGNOSIS — Z794 Long term (current) use of insulin: Secondary | ICD-10-CM

## 2018-02-14 DIAGNOSIS — Z87442 Personal history of urinary calculi: Secondary | ICD-10-CM

## 2018-02-14 DIAGNOSIS — E1122 Type 2 diabetes mellitus with diabetic chronic kidney disease: Secondary | ICD-10-CM | POA: Diagnosis present

## 2018-02-14 DIAGNOSIS — I495 Sick sinus syndrome: Secondary | ICD-10-CM | POA: Diagnosis present

## 2018-02-14 DIAGNOSIS — Z833 Family history of diabetes mellitus: Secondary | ICD-10-CM

## 2018-02-14 DIAGNOSIS — Z79899 Other long term (current) drug therapy: Secondary | ICD-10-CM

## 2018-02-14 DIAGNOSIS — Z7902 Long term (current) use of antithrombotics/antiplatelets: Secondary | ICD-10-CM

## 2018-02-14 DIAGNOSIS — E1169 Type 2 diabetes mellitus with other specified complication: Secondary | ICD-10-CM | POA: Diagnosis present

## 2018-02-14 DIAGNOSIS — Z880 Allergy status to penicillin: Secondary | ICD-10-CM

## 2018-02-14 DIAGNOSIS — R0600 Dyspnea, unspecified: Secondary | ICD-10-CM

## 2018-02-14 DIAGNOSIS — I214 Non-ST elevation (NSTEMI) myocardial infarction: Secondary | ICD-10-CM | POA: Clinically undetermined

## 2018-02-14 HISTORY — DX: Chronic kidney disease, stage 3 unspecified: N18.30

## 2018-02-14 HISTORY — DX: Type 2 diabetes mellitus without complications: E11.9

## 2018-02-14 HISTORY — DX: Personal history of urinary calculi: Z87.442

## 2018-02-14 HISTORY — DX: Chronic kidney disease, stage 3 (moderate): N18.3

## 2018-02-14 LAB — HEPATIC FUNCTION PANEL
ALT: 28 U/L (ref 0–44)
AST: 39 U/L (ref 15–41)
Albumin: 3.2 g/dL — ABNORMAL LOW (ref 3.5–5.0)
Alkaline Phosphatase: 34 U/L — ABNORMAL LOW (ref 38–126)
Bilirubin, Direct: 0.2 mg/dL (ref 0.0–0.2)
Indirect Bilirubin: 0.6 mg/dL (ref 0.3–0.9)
Total Bilirubin: 0.8 mg/dL (ref 0.3–1.2)
Total Protein: 5.5 g/dL — ABNORMAL LOW (ref 6.5–8.1)

## 2018-02-14 LAB — BASIC METABOLIC PANEL
Anion gap: 12 (ref 5–15)
BUN: 27 mg/dL — ABNORMAL HIGH (ref 8–23)
CO2: 21 mmol/L — ABNORMAL LOW (ref 22–32)
Calcium: 8.5 mg/dL — ABNORMAL LOW (ref 8.9–10.3)
Chloride: 109 mmol/L (ref 98–111)
Creatinine, Ser: 1.64 mg/dL — ABNORMAL HIGH (ref 0.44–1.00)
GFR calc Af Amer: 36 mL/min — ABNORMAL LOW (ref >60)
GFR calc non Af Amer: 31 mL/min — ABNORMAL LOW (ref >60)
Glucose, Bld: 378 mg/dL — ABNORMAL HIGH (ref 70–99)
Potassium: 5.1 mmol/L (ref 3.5–5.1)
Sodium: 142 mmol/L (ref 135–145)

## 2018-02-14 LAB — CBC WITH DIFFERENTIAL/PLATELET
Abs Immature Granulocytes: 0 10*3/uL (ref 0.0–0.1)
Abs Immature Granulocytes: 0 10*3/uL (ref 0.0–0.1)
Basophils Absolute: 0.1 10*3/uL (ref 0.0–0.1)
Basophils Absolute: 0.1 10*3/uL (ref 0.0–0.1)
Basophils Relative: 1 %
Basophils Relative: 1 %
Eosinophils Absolute: 0.1 10*3/uL (ref 0.0–0.7)
Eosinophils Absolute: 0 10*3/uL (ref 0.0–0.7)
Eosinophils Relative: 2 %
Eosinophils Relative: 0 %
HCT: 38.9 % (ref 36.0–46.0)
HCT: 42.2 % (ref 36.0–46.0)
Hemoglobin: 12.4 g/dL (ref 12.0–15.0)
Hemoglobin: 13.4 g/dL (ref 12.0–15.0)
Immature Granulocytes: 0 %
Immature Granulocytes: 0 %
Lymphocytes Relative: 17 %
Lymphocytes Relative: 17 %
Lymphs Abs: 1.3 10*3/uL (ref 0.7–4.0)
Lymphs Abs: 1.6 10*3/uL (ref 0.7–4.0)
MCH: 27.7 pg (ref 26.0–34.0)
MCH: 27.6 pg (ref 26.0–34.0)
MCHC: 31.9 g/dL (ref 30.0–36.0)
MCHC: 31.8 g/dL (ref 30.0–36.0)
MCV: 86.8 fL (ref 78.0–100.0)
MCV: 86.8 fL (ref 78.0–100.0)
Monocytes Absolute: 0.3 10*3/uL (ref 0.1–1.0)
Monocytes Absolute: 0.4 10*3/uL (ref 0.1–1.0)
Monocytes Relative: 4 %
Monocytes Relative: 4 %
Neutro Abs: 5.8 10*3/uL (ref 1.7–7.7)
Neutro Abs: 7.3 10*3/uL (ref 1.7–7.7)
Neutrophils Relative %: 76 %
Neutrophils Relative %: 78 %
Platelets: 207 10*3/uL (ref 150–400)
Platelets: 264 10*3/uL (ref 150–400)
RBC: 4.48 MIL/uL (ref 3.87–5.11)
RBC: 4.86 MIL/uL (ref 3.87–5.11)
RDW: 13.8 % (ref 11.5–15.5)
RDW: 13.9 % (ref 11.5–15.5)
WBC: 7.7 10*3/uL (ref 4.0–10.5)
WBC: 9.4 10*3/uL (ref 4.0–10.5)

## 2018-02-14 LAB — I-STAT CHEM 8, ED
BUN: 27 mg/dL — ABNORMAL HIGH (ref 8–23)
Calcium, Ion: 1.02 mmol/L — ABNORMAL LOW (ref 1.15–1.40)
Chloride: 112 mmol/L — ABNORMAL HIGH (ref 98–111)
Creatinine, Ser: 1.2 mg/dL — ABNORMAL HIGH (ref 0.44–1.00)
Glucose, Bld: 390 mg/dL — ABNORMAL HIGH (ref 70–99)
HCT: 36 % (ref 36.0–46.0)
Hemoglobin: 12.2 g/dL (ref 12.0–15.0)
Potassium: 4.2 mmol/L (ref 3.5–5.1)
Sodium: 142 mmol/L (ref 135–145)
TCO2: 18 mmol/L — ABNORMAL LOW (ref 22–32)

## 2018-02-14 LAB — TSH: TSH: 0.78 u[IU]/mL (ref 0.350–4.500)

## 2018-02-14 LAB — URINALYSIS, ROUTINE W REFLEX MICROSCOPIC
Bacteria, UA: NONE SEEN
Bilirubin Urine: NEGATIVE
Glucose, UA: >=500 mg/dL — AB
Hgb urine dipstick: NEGATIVE
Ketones, ur: 5 mg/dL — AB
Leukocytes, UA: NEGATIVE
Nitrite: NEGATIVE
Protein, ur: NEGATIVE mg/dL
Specific Gravity, Urine: 1.027 (ref 1.005–1.030)
pH: 5 (ref 5.0–8.0)

## 2018-02-14 LAB — GLUCOSE, CAPILLARY
Glucose-Capillary: 359 mg/dL — ABNORMAL HIGH (ref 70–99)
Glucose-Capillary: 416 mg/dL — ABNORMAL HIGH (ref 70–99)

## 2018-02-14 LAB — HEMOGLOBIN A1C
Hgb A1c MFr Bld: 10.1 % — ABNORMAL HIGH (ref 4.8–5.6)
Mean Plasma Glucose: 243.17 mg/dL

## 2018-02-14 LAB — TROPONIN I: Troponin I: 7.11 ng/mL (ref <0.03)

## 2018-02-14 LAB — I-STAT TROPONIN, ED: Troponin i, poc: 0.07 ng/mL (ref 0.00–0.08)

## 2018-02-14 LAB — MAGNESIUM: Magnesium: 1.5 mg/dL — ABNORMAL LOW (ref 1.7–2.4)

## 2018-02-14 MED ORDER — DILTIAZEM HCL-DEXTROSE 100-5 MG/100ML-% IV SOLN (PREMIX)
5.0000 mg/h | Freq: Once | INTRAVENOUS | Status: AC
  Administered 2018-02-14: 10 mg/h via INTRAVENOUS
  Filled 2018-02-14: qty 100

## 2018-02-14 MED ORDER — DILTIAZEM LOAD VIA INFUSION
10.0000 mg | Freq: Once | INTRAVENOUS | Status: AC
  Administered 2018-02-14: 10 mg via INTRAVENOUS
  Filled 2018-02-14: qty 10

## 2018-02-14 MED ORDER — APIXABAN 5 MG PO TABS
5.0000 mg | ORAL_TABLET | Freq: Two times a day (BID) | ORAL | Status: DC
  Administered 2018-02-14: 5 mg via ORAL
  Filled 2018-02-14 (×2): qty 1

## 2018-02-14 MED ORDER — ADULT MULTIVITAMIN W/MINERALS CH
1.0000 | ORAL_TABLET | Freq: Two times a day (BID) | ORAL | Status: DC
  Administered 2018-02-14 – 2018-02-19 (×10): 1 via ORAL
  Filled 2018-02-14 (×10): qty 1

## 2018-02-14 MED ORDER — ONDANSETRON HCL 4 MG/2ML IJ SOLN
4.0000 mg | Freq: Four times a day (QID) | INTRAMUSCULAR | Status: DC | PRN
Start: 2018-02-14 — End: 2018-02-19
  Administered 2018-02-15 – 2018-02-16 (×3): 4 mg via INTRAVENOUS
  Filled 2018-02-14 (×3): qty 2

## 2018-02-14 MED ORDER — METOPROLOL TARTRATE 25 MG PO TABS
25.0000 mg | ORAL_TABLET | Freq: Two times a day (BID) | ORAL | Status: DC
  Administered 2018-02-14 – 2018-02-19 (×10): 25 mg via ORAL
  Filled 2018-02-14 (×10): qty 1

## 2018-02-14 MED ORDER — ACETAMINOPHEN 325 MG PO TABS: 650.0000 mg | ORAL_TABLET | ORAL | Status: DC | PRN

## 2018-02-14 MED ORDER — METOPROLOL TARTRATE 5 MG/5ML IV SOLN
5.0000 mg | Freq: Once | INTRAVENOUS | Status: AC
  Administered 2018-02-14: 5 mg via INTRAVENOUS
  Filled 2018-02-14: qty 5

## 2018-02-14 MED ORDER — DILTIAZEM HCL-DEXTROSE 100-5 MG/100ML-% IV SOLN (PREMIX)
5.0000 mg/h | INTRAVENOUS | Status: DC
  Administered 2018-02-14 – 2018-02-16 (×8): 15 mg/h via INTRAVENOUS
  Filled 2018-02-14 (×8): qty 100

## 2018-02-14 MED ORDER — SODIUM CHLORIDE 0.9 % IV BOLUS
500.0000 mL | Freq: Once | INTRAVENOUS | Status: AC
Start: 2018-02-14 — End: 2018-02-14
  Administered 2018-02-14: 500 mL via INTRAVENOUS

## 2018-02-14 MED ORDER — SODIUM CHLORIDE 0.9 % IV SOLN
INTRAVENOUS | Status: DC
  Administered 2018-02-14 – 2018-02-15 (×3): via INTRAVENOUS

## 2018-02-14 MED ORDER — INSULIN ASPART 100 UNIT/ML ~~LOC~~ SOLN
20.0000 [IU] | Freq: Once | SUBCUTANEOUS | Status: AC
  Administered 2018-02-14: 20 [IU] via SUBCUTANEOUS

## 2018-02-14 MED ORDER — SODIUM CHLORIDE 0.9 % IV BOLUS
500.0000 mL | Freq: Once | INTRAVENOUS | Status: AC
  Administered 2018-02-14: 500 mL via INTRAVENOUS

## 2018-02-14 MED ORDER — INSULIN ASPART 100 UNIT/ML ~~LOC~~ SOLN
0.0000 [IU] | Freq: Three times a day (TID) | SUBCUTANEOUS | Status: DC
  Administered 2018-02-14: 15 [IU] via SUBCUTANEOUS
  Administered 2018-02-15: 11 [IU] via SUBCUTANEOUS

## 2018-02-14 MED ORDER — PRAVASTATIN SODIUM 40 MG PO TABS
80.0000 mg | ORAL_TABLET | Freq: Every day | ORAL | Status: DC
  Administered 2018-02-15 – 2018-02-18 (×3): 80 mg via ORAL
  Filled 2018-02-14 (×3): qty 2

## 2018-02-14 MED ORDER — METOPROLOL TARTRATE 5 MG/5ML IV SOLN
2.5000 mg | INTRAVENOUS | Status: AC
  Administered 2018-02-14 (×4): 2.5 mg via INTRAVENOUS
  Filled 2018-02-14 (×2): qty 5

## 2018-02-14 MED ORDER — DILTIAZEM HCL 25 MG/5ML IV SOLN: 10.0000 mg | Freq: Once | INTRAVENOUS | Status: DC

## 2018-02-14 NOTE — Progress Notes (Addendum)
CRITICAL VALUE ALERT  Critical Value:  7.11 Troponin   Date & Time Notied:  02/14/18 at 2059, notified by lab.  Provider Notified: Dr. Paticia Stack at 2309, notified provider.  Orders Received/Actions taken:  awating for orders.  2315: MD aware of situation, asked if patient had any chest pain, stated no.  Dr. Paticia Stack will relate information to morning provider.

## 2018-02-14 NOTE — Progress Notes (Signed)
CRITICAL VALUE ALERT  Critical Value:  416 CBG  Date & Time Notied:  02/14/18 at 2046  Provider Notified: Dr. Paticia Stack   Orders Received/Actions taken:  MD called, received verbal orders for insulin 20 units one time dose.

## 2018-02-14 NOTE — H&P (Addendum)
History & Physical    Patient ID: [name] MRN: [id number], DOB/AGE: Aug 10, 1951   Admit date: 02/14/2018 Primary Physician: Alison Herb, MD Primary Cardiologist: Dr. Percival Spanish, MD   Patient Profile   [name] is a 67yo F with a hx of nonobstructive CAD (per cath 2006, normal nuc 2011), CKD stage III, dyslipidemia, hypertension, DM 2, moderate AS, carotid stenosis, known small pericardial effusion (per echo 2013l), stroke/TIA and OSA not on CPAP who is being admitted for new onset atrial fibrillation.   Past Medical History   Past Medical History:  Diagnosis Date  . Abnormal Doppler ultrasound of carotid artery    a. 05/2013 - mild plaque, 1-39% BICA, abnormal appearrance of thyroid (TSH wnl in 06/2013).  Marland Kitchen CAD (coronary artery disease)    a. Nonobstructive by cath 2006 - 25% LAD, 25-30% after 1st diag, distal 25% PDA, minor irreg LCx. b. Normal nuc 2011.  . Cataract    surgery  . Chronic kidney disease    stage 3  . Cystocele   . Difficult intubation 09-07-2012   big neck trouble with intubation parotid surgery"takes little med to sedate"  . Dyslipidemia   . Esophageal stricture   . GERD (gastroesophageal reflux disease)   . Hiatal hernia    a. 07/2013: HH with small stricture holding up barium tablet (concides with patient's sx of food sticking).  . Hyperlipidemia   . Hypertension    dr [name]  . IBS (irritable bowel syndrome)   . IDDM (insulin dependent diabetes mellitus) ([place])    type 2   . Memory difficulty 11/26/2015  . Morbid obesity ([place])   . Osteoarthritis   . Osteopenia   . Osteoporosis   . Palpitations    a. PVC/PAC on EKG 08/2013.  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  Marland Kitchen Pericardial effusion    a. Mod by echo 2012 at time of PNA. b. Echo 07/2012: small pericardial effusion vs fat.  Marland Kitchen PONV (postoperative nausea and vomiting)    n/v, and room spinning after cataract and parotid surgery  . Sleep apnea    CPAP  currently broke, calling company about machine, pt does not know cpap settings, uses cpap off and on  . Stroke [facility]) 11-07-2014   tia  . Superficial phlebitis   . TIA (transient ischemic attack) 2016  . Toe ulcer ([place]) 01-08-14   Left big toe-being tx. at wound center "is healing"    Past Surgical History:  Procedure Laterality Date  . BREATH TEK H PYLORI N/A 07/29/2013   Procedure: BREATH TEK H PYLORI;  Surgeon: Shann Medal, MD;  Location: [facility];  Service: General;  Laterality: N/A;  . CARDIAC CATHETERIZATION     06  . CARPAL TUNNEL RELEASE  5 yrs ago   Left  . CATARACT EXTRACTION Bilateral yrs ago  . CHOLECYSTECTOMY  1982  . COLONOSCOPY N/A 07/14/2015   Procedure: COLONOSCOPY;  Surgeon: Ladene Artist, MD;  Location: [facility];  Service: Endoscopy;  Laterality: N/A;  . ESOPHAGOGASTRODUODENOSCOPY N/A 12/27/2013   Procedure: ESOPHAGOGASTRODUODENOSCOPY (EGD);  Surgeon: Shann Medal, MD;  Location: [facility];  Service: General;  Laterality: N/A;  . EYE SURGERY    . LAPAROSCOPIC GASTRIC SLEEVE RESECTION N/A 01/20/2014   Procedure: LAPAROSCOPIC GASTRIC SLEEVE RESECTION;  Surgeon: Shann Medal, MD;  Location: [facility];  Service: General;  Laterality: N/A;  . PAROTIDECTOMY  09/07/2012   Procedure: PAROTIDECTOMY;  Surgeon: Melida Quitter,  MD;  Location: [facility];  Service: ENT;  Laterality: Left;  LEFT PAROTIDECTOMY  . toe removed - left great toe      [facility]   . TONSILLECTOMY  1968  . TUBAL LIGATION  yrs ago  . UPPER GI ENDOSCOPY  01/20/2014   Procedure: UPPER GI ENDOSCOPY;  Surgeon: Shann Medal, MD;  Location: [facility];  Service: General;;     Allergies  Allergies  Allergen Reactions  . Atorvastatin     Myalgias   . Crestor [Rosuvastatin Calcium] Other (See Comments)    Stiffness and back pain  . Ezetimibe-Simvastatin Other (See Comments)    Leg cramps  . Celebrex [Celecoxib] Rash  . Levemir [Insulin Detemir] Rash  . Penicillins Other (See Comments)    SYNCOPE  (Tolerated Ancef) Has patient had a PCN reaction causing immediate rash, facial/tongue/throat swelling, SOB or lightheadedness with hypotension: Yes Has patient had a PCN reaction causing severe rash involving mucus membranes or skin necrosis: No Has patient had a PCN reaction that required hospitalization No Has patient had a PCN reaction occurring within the last 10 years: No If all of the above answers are "NO", then may proceed with Cephalosporin use.    History of Present Illness    [name] is a 67yo F with a hx as stated above who presented to [facility] on 02/14/18 with c/o left chest/breast discomfort which initially began intermittently approximately one week ago. She describes the discomfort as an "ache" which does not radiate. She had no associated symptoms. She cannot recall if the pain was in relation to exertion or rest and she did not take anything to help. She first thought that the discomfort was "indigestion" and she drank a soda with some relief. She states that her grandchildren stayed with her last night and she woke up early this morning feeling warm and "shaky". She called her daughter who is an Therapist, sports at [employer] who prompted her to call EMS for transport to the ED for further evaluation. When EMS arrived, pt was found to be in atrial fibrillation with a rate in the 180's initially. She was treated with IV Cardizem bolus 10 mg x2 and was started on a diltiazem gtt at 15mg /hr.  Her heart rate improved, however remains in the 120-130's. Initial SBP was in the 140 range and decreased to the 90's after the initiation of diltiazem. This too has now improved. CXR with no acute cardiopulmonary disease. She had a remote cath in 2006 with nonobstructive CAD and a normal nuclear stress test in 2011. She denies recent illness, however states that her grandchildren have have strep. She denies personal fever, chills, N/V, LE swelling, palpitations, dizziness, or syncope. She has a known hx of orthopnea,  likely related to truncal obesity. She has known OSA not on CPAP. She denies tobacco, alcohol, or illicit drug use. She has a family hx of CAD in her mother (CABG) and her sister with MI.    Cardiology has been asked to admit given her hx and new onset AF.   Home Medications    Prior to Admission medications   Medication Sig Start Date End Date Taking? Authorizing Provider  aspirin 325 MG tablet Take 325 mg by mouth once.   Yes [provider]  Calcium Carbonate-Vitamin D (CALCIUM 600+D) 600-400 MG-UNIT per tablet Take 1 tablet by mouth 2 (two) times daily. 05/06/15  Yes Cherre Robins, PharmD  cholecalciferol (VITAMIN D) 1000 UNITS tablet Take 1,000 Units by mouth daily.  Yes [provider]  clopidogrel (PLAVIX) 75 MG tablet TAKE 1 TABLET BY MOUTH EVERY DAY 12/13/17  Yes Alison Herb, MD  fenofibrate (TRICOR) 145 MG tablet TAKE 1 TABLET BY MOUTH ONCE DAILY 01/01/18  Yes Alison Herb, MD  LIVALO 4 MG TABS TAKE 1 TABLET BY MOUTH ONCE DAILY 02/05/18  Yes Alison Herb, MD  losartan (COZAAR) 25 MG tablet TAKE 1 TABLET BY MOUTH EVERY DAY 12/26/17  Yes Alison Herb, MD  meclizine (ANTIVERT) 12.5 MG tablet Take 1 tablet (12.5 mg total) by mouth 3 (three) times daily as needed for dizziness. 01/30/15  Yes Alison Herb, MD  metFORMIN (GLUCOPHAGE) 1000 MG tablet Take 1 Tablet by mouth 2 times a day 05/22/17  Yes Alison Herb, MD  Multiple Vitamin (MULTIVITAMIN WITH MINERALS) TABS tablet Take 1 tablet by mouth 2 (two) times daily.   Yes [provider]  NOVOLIN 70/30 RELION (70-30) 100 UNIT/ML injection Inject 15-65 Units into the skin See admin instructions. Taking 65 units with breakfast and 15 units at lunch and 65 units with dinner. 07/13/16  Yes [provider]  ondansetron (ZOFRAN ODT) 4 MG disintegrating tablet Take 1 tablet (4 mg total) by mouth every 8 (eight) hours as needed for nausea. 01/03/17  Yes Larene Pickett, PA-C  Propylene Glycol (SYSTANE  COMPLETE) 0.6 % SOLN Apply 1 drop to eye as needed (dry eyes).   Yes [provider]  sodium chloride (OCEAN) 0.65 % SOLN nasal spray Place 1 spray into both nostrils as needed for congestion. 11/09/14  Yes Domenic Polite, MD  vitamin E 400 UNIT capsule Take 400 Units by mouth daily.   Yes [provider]  ACCU-CHEK AVIVA PLUS test strip USE TO CHECK BLOOD SUGAR UP TO TWICE DAILY OR AS INSTRUCTED 09/04/17   Alison Herb, MD  B-D INS SYR ULTRAFINE 1CC/31G 31G X 5/16" 1 ML MISC USE TO INJECT INSULIN TWICE A DAY AS INSTRUCTED 11/16/15   Alison Herb, MD  Insulin Pen Needle 32G X 4 MM MISC Use to inject insulin with insulin pen 05/17/16   Alison Herb, MD  Olmesartan-Amlodipine-HCTZ (TRIBENZOR) 40-5-12.5 MG TABS Take 1 tablet by mouth daily.    11/01/11  [provider]    Family History    Family History  Problem Relation Age of Onset  . Heart failure Mother   . Hypertension Mother   . Skin cancer Mother   . Colitis Mother   . Diabetes Mother   . Kidney Stones Mother   . Stroke Mother   . Heart disease Father   . Colon polyps Father   . Diabetes Father   . Heart failure Father   . Heart attack Sister   . Diabetes Other   . Diabetes Daughter   . Stroke Maternal Grandfather   . Diabetes Maternal Grandfather   . Colon cancer Neg Hx     Social History    Social History   Socioeconomic History  . Marital status: Married    Spouse name: Not on file  . Number of children: 3  . Years of education: some college  . Highest education level: Some college, no degree  Occupational History  . Occupation: retired    Fish farm manager: [employer]  . Financial resource strain: Not hard at all  . Food insecurity:    Worry: Never true    Inability: Never true  . Transportation needs:    Medical: No  Non-medical: No  Tobacco Use  . Smoking status: Never Smoker  . Smokeless tobacco: Never Used  Substance and Sexual Activity  . Alcohol use:  Yes    Alcohol/week: 0.0 oz    Comment: rarely  . Drug use: Yes    Types: Other-see comments  . Sexual activity: Yes    Birth control/protection: Post-menopausal  Lifestyle  . Physical activity:    Days per week: 0 days    Minutes per session: 0 min  . Stress: Only a little  Relationships  . Social connections:    Talks on phone: More than three times a week    Gets together: More than three times a week    Attends religious service: More than 4 times per year    Active member of club or organization: Yes    Attends meetings of clubs or organizations: More than 4 times per year    Relationship status: Married  . Intimate partner violence:    Fear of current or ex partner: No    Emotionally abused: No    Physically abused: No    Forced sexual activity: No  Other Topics Concern  . Not on file  Social History Narrative   Married   Patient is right handed.   Patient rarely drinks caffeine.   Lives at home with husband in two story home. Husband washes laundry in the basement. They have 3 daughters and 7 grandchildren.      Review of Systems   Please see HPI  All other systems reviewed and are otherwise negative except as noted above.  Physical Exam    Blood pressure 114/88, pulse (!) 144, temperature 97.7 F (36.5 C), temperature source Oral, resp. rate (!) 26, height 5\' 7"  (1.702 m), weight 208 lb (94.3 kg), SpO2 91 %.   General: Obese, NAD Skin: Warm, dry, intact  Head: Normocephalic, atraumatic,clear, moist mucus membranes. Neck: Negative for carotid bruits. No JVD Lungs:Clear to ausculation bilaterally. No wheezes, rales, or rhonchi. Breathing is unlabored. Cardiovascular: RRR with S1 S2. No murmurs, rubs or gallops Abdomen: Obese, soft, non-tender, non-distended with normoactive bowel sounds. No obvious abdominal masses. MSK: Strength and tone appear normal for age. 5/5 in all extremities Extremities: No edema. No clubbing or cyanosis. DP/PT pulses 2+  bilaterally Neuro: Alert and oriented. No focal deficits. No facial asymmetry. MAE spontaneously. Psych: Responds to questions appropriately with normal affect.    Labs    Troponin (Point of Care Test) Recent Labs    02/14/18 1157  TROPIPOC 0.07   No results for input(s): CKTOTAL, CKMB, TROPONINI in the last 72 hours. Lab Results  Component Value Date   WBC 7.7 02/14/2018   HGB 12.2 02/14/2018   HCT 36.0 02/14/2018   MCV 86.8 02/14/2018   PLT 207 02/14/2018    Recent Labs  Lab 02/14/18 1139 02/14/18 1158  NA  --  142  K  --  4.2  CL  --  112*  BUN  --  27*  CREATININE  --  1.20*  PROT 5.5*  --   BILITOT 0.8  --   ALKPHOS 34*  --   ALT 28  --   AST 39  --   GLUCOSE  --  390*   Lab Results  Component Value Date   CHOL 186 12/14/2017   HDL 45 12/14/2017   LDLCALC 97 12/14/2017   TRIG 220 (H) 12/14/2017   Lab Results  Component Value Date   DDIMER 1.02 (H) 05/07/2011  Radiology Studies    Dg Chest Port 1 View  Result Date: 02/14/2018 CLINICAL DATA:  Chest pain with palpitations EXAM: PORTABLE CHEST 1 VIEW COMPARISON:  Jan 08, 2014 FINDINGS: There is no edema or consolidation. Heart is mildly enlarged with pulmonary vascularity normal. No adenopathy. There is aortic atherosclerosis. No evident bone lesions. No pneumothorax. IMPRESSION: No edema or consolidation. Mild cardiac enlargement. There is aortic atherosclerosis. Aortic Atherosclerosis (ICD10-I70.0). Electronically Signed   By: Lowella Grip III M.D.   On: 02/14/2018 11:54   ECG & Cardiac Imaging    EKG 02/14/18: Atrial fibrillation HR 127  Echocardiogram 06/28/17: Study Conclusions Impressions:  - Normal LV size with moderate LV hypertrophy. EF 60-65%. Normal RV   size and systolic function. There was moderate to severe aortic   stenosis (moderate by mean gradient, severe by calculated valve   area). Visually, aortic stenosis looked severe. Mild pulmonary   hypertension.  Cardiac  catheterization 01/06/2005:  PRIMARY CARE PHYSICIAN:  Dr. Morrie Sheldon.   REASON FOR PRESENTATION:  Evaluate the patient with chest pain and a  Cardiolite suggesting anterior ischemia.  Chest CT scan, (coronary CT scan),  suggested a 50-70% left anterior descending stenosis.   RESULTS:  Hemodynamics:  LV 147/20, A 147/74.  Coronaries:  The left main  was normal.  The LAD had osteal 25% stenosis.  There was 25-30% stenosis  after the first diagonal.  The circumflex in the AV groove was normal.  OM1  to OM3 were small and normal.  OM4 was large, branching and normal.  The  right coronary artery was dominant.  There were luminal irregularities.  There was distal 25% stenosis after the PDA.  Left ventriculogram:  The left  ventriculogram was obtained in the RAO projection.  The ejection fraction  was 65% with normal wall motion.  CONCLUSION:  Nonobstructive coronary disease. PLAN:  The patient will have medical management.  Assessment & Plan    1. New atrial fibrillation with RVR: -Pt presented with intermittent left breast/chest discomfort for one week which became worse early AM on day of admission. She was found to be in AF with RVR per EMS with an initial rate of 180 bpm -Started on diltiazem gtt with improvement in rate however remains in ST with a rates in the 120-130's -Start Metoprolol 25mg  PO twice daily for rate control -Stop ASA and Plavix for now given the start of Eliquis -Will check TSH, last level 0.962 -Will order repeat echocardiogram to evaluate for changes given the presence of chest pain  -Given CHA2DS2VASc =7 (age, sex, HTN, TIA, vascular disease, and DM) will need to anticoagulate and focus on rate control -Start Eliquis 5mg  twice daily  -Will plan for TEE guided DCCV on Friday 02/16/18 at 2pm. If pt converts spontaneously, we will cancel procedure.   2. Hx of non-obstructive CAD with chest pain on admission: -Patient admitted with complaints of mild left  breast/chest discomfort, initally occurring intermittently over the last week. Early this AM, the chest discomfort worsened slightly and she was prompted to call EMS by family members. She was found to be in atrial fibrillation per EMS with HR in the 180s -Troponin elevated at 0.07. Will continue to trend -Pt currently resting comfortably without chest pain>>>elevation likely in the setting of atrial fibrillation with RVR -May need ischemic evaluation at some point, once HR stabilized -Will add Metoprolol for rate control  -Documented statin intolerance  3.Known moderate AS: -Last echocardiogram 06/2017 with moderate aortic stenosis -Asymptomatic, monitoring  per primary cardiologist   4. HTN: -Stable, 114/80, 129/87, 115/79, 105/72 -Will hold home losartan and add metoprolol for better rate control   5. HLD: -Last lipid panel 12/14/2017 with CHO-186, HDL-45, LDL-97, Trig-220 -Managed on home fenofibrate,  -Will check lipid panel  -Documented statin intolerance   6. Hx of pericardial effusion: -Per echocardiogram, however no evidence per echocardiogram 06/28/2017  6. OSA of CPAP: -Reports hx of orthopnea, likely in the setting of truncal obesity -Will need referral for possible OP sleep study   7.  DM2: -Uncontrolled, glucose on arrival> 390 -Last hemoglobin A1c, 9.8 on 12/14/2017 -Managed on home Metformin, Novolin 70/30 -SSI for glucose control while inpatient  8. Carotid stenosis: -Carotid doppler 1-39% stenosis involving tight ICA and left ICA -Hold ASA and Plavix for now secondary to Eliquis initiation   9. Hx of TIA/stroke 2016: -Followed OP by Dr. Jannifer Franklin -No deficits noted  10. CKD Stage III: -Creatinine, 1.20 today -Baseline appears to be in the 1.2-1.3 range -Avoid nephrotoxic medication -Receiving IV fluids    Signed, Kathyrn Drown NP-C [facility] Pager: [phone] 02/14/2018, @NOW    I have seen and examined the patient along with Kathyrn Drown NP-C.  I  have reviewed the chart, notes and new data.  I agree with NP's note.  Key new complaints: Feels much better with reduction in heart rate, even though not yet even close to perfect rate control.  No angina, no dyspnea at rest Key examination changes: Is now in a regular rhythm with a rates that is fixed at 139 bpm, highly likely to be atrial flutter with 2: 1 AV block.  Obesity limits her exam, early peaking 2/6 aortic systolic ejection murmur, but otherwise normal cardiovascular exam. Key new findings / data: Initial ECG is highly compatible with atrial fibrillation with rapid ventricular response, telemetry now looks like atrial flutter with 2: 1 AV block.  Marginal elevation in cardiac troponin I is unlikely to be clinically important.  Poorly controlled diabetes mellitus.  PLAN: She is at high risk for recurrent stroke and needs to start anticoagulation, preferably to start a direct oral anticoagulant today. Rate control is likely to be very challenging now that she is in atrial flutter.  If she does not spontaneously convert to normal rhythm, tentatively scheduled for TEE guided cardioversion on Friday.  Discussed the TEE (she is very familiar with endoscopy from her previous GI problems), moderate sedation with propofol and direct-current cardioversion in detail. In the meantime we will add beta-blocker to the intravenous diltiazem for rate control. Her angina was related to extreme tachycardia and has resolved, even though her heart rate is still around 140 bpm.  It is quite possible she has underlying CAD, but this can be evaluated at a later date, even as an outpatient.  The suspicion for acute coronary insufficiency is very low. For the less, would benefit from more aggressive lipid-lowering to achieve target LDL under 70. We will have to readdress the importance of CPAP for obstructive sleep apnea, likely to be a driver for her atrial arrhythmia.  Sanda Klein, MD, [place] [phone] 02/14/2018, 5:28 PM

## 2018-02-14 NOTE — ED Provider Notes (Signed)
[facility] EMERGENCY DEPARTMENT Provider Note   CSN: [id number] Arrival date & time: 02/14/18  1122     History   Chief Complaint Chief Complaint  Patient presents with  . Atrial Fibrillation    HPI [name] is a 67 y.o. female.  HPI   She presents for evaluation of chest discomfort, which started today, causing her to call EMS.  EMS arrived to find her in rapid A. fib, she was treated with IV Cardizem 10 mg, x2/30 minutes.  Initial heart rate 180s, improved somewhat but continued to be tachycardic and irregular.  Blood pressure after first Cardizem dose decreased from 706 systolic to 90 systolic.  No specific treatment was begun for chest discomfort.  Patient denies prior cardiac history.  She denies fever, chills, cough, shortness of breath, weakness or dizziness.  There are no other known modifying factors.  Past Medical History:  Diagnosis Date  . Abnormal Doppler ultrasound of carotid artery    a. 05/2013 - mild plaque, 1-39% BICA, abnormal appearrance of thyroid (TSH wnl in 06/2013).  Marland Kitchen CAD (coronary artery disease)    a. Nonobstructive by cath 2006 - 25% LAD, 25-30% after 1st diag, distal 25% PDA, minor irreg LCx. b. Normal nuc 2011.  . Cataract    surgery  . Chronic kidney disease    stage 3  . Cystocele   . Difficult intubation 09-07-2012   big neck trouble with intubation parotid surgery"takes little med to sedate"  . Dyslipidemia   . Esophageal stricture   . GERD (gastroesophageal reflux disease)   . Hiatal hernia    a. 07/2013: HH with small stricture holding up barium tablet (concides with patient's sx of food sticking).  . Hyperlipidemia   . Hypertension    dr Percival Spanish  . IBS (irritable bowel syndrome)   . IDDM (insulin dependent diabetes mellitus) ([place])    type 2   . Memory difficulty 11/26/2015  . Morbid obesity ([place])   . Osteoarthritis   . Osteopenia   . Osteoporosis   . Palpitations    a. PVC/PAC on EKG 08/2013.  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  Marland Kitchen Pericardial effusion    a. Mod by echo 2012 at time of PNA. b. Echo 07/2012: small pericardial effusion vs fat.  Marland Kitchen PONV (postoperative nausea and vomiting)    n/v, and room spinning after cataract and parotid surgery  . Sleep apnea    CPAP currently broke, calling company about machine, pt does not know cpap settings, uses cpap off and on  . Stroke [facility]) 11-07-2014   tia  . Superficial phlebitis   . TIA (transient ischemic attack) 2016  . Toe ulcer ([place]) 01-08-14   Left big toe-being tx. at wound center "is healing"    Patient Active Problem List   Diagnosis Date Noted  . Diabetic retinopathy ([place]) 09/17/2017  . Nonrheumatic aortic valve stenosis 02/13/2017  . Palpitations 02/13/2017  . Memory difficulty 11/26/2015  . Benign neoplasm of sigmoid colon 07/14/2015  . Low serum vitamin D 05/06/2015  . Vertigo 05/06/2015  . Aphasia 12/08/2014  . TIA (transient ischemic attack) 11/07/2014  . 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 CKD stage 3 due to type 2 diabetes mellitus ([place]) 06/03/2014  . Type II diabetes mellitus with renal manifestations ([place]) 06/03/2014  . Type II diabetes mellitus with peripheral circulatory disorder ([place]) 06/03/2014  . Obesity (BMI 30.0-34.9) 04/24/2014  . Dizziness and giddiness 04/14/2014  .  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 Osteopenia 10/09/2013  . Abnormal ankle brachial index 09/11/2013  . Hypomagnesemia 08/01/2013  . DYSPNEA 08/18/2010  . MYOCARDIAL PERFUSION SCAN, WITH STRESS TEST, ABNORMAL 08/04/2010  . ELECTROCARDIOGRAM, ABNORMAL 07/28/2010  . Edema extremities 12/30/2008  . Hyperlipidemia 12/26/2008  . Benign essential HTN 12/26/2008  . CAD 12/26/2008  . PERICARDIAL EFFUSION 12/26/2008  . GERD 12/26/2008  . OSTEOARTHRITIS 12/26/2008    Past Surgical History:  Procedure Laterality Date  . BREATH TEK H PYLORI N/A 07/29/2013   Procedure: BREATH TEK H PYLORI;  Surgeon:  Shann Medal, MD;  Location: [facility];  Service: General;  Laterality: N/A;  . CARDIAC CATHETERIZATION     06  . CARPAL TUNNEL RELEASE  5 yrs ago   Left  . CATARACT EXTRACTION Bilateral yrs ago  . CHOLECYSTECTOMY  1982  . COLONOSCOPY N/A 07/14/2015   Procedure: COLONOSCOPY;  Surgeon: Ladene Artist, MD;  Location: [facility];  Service: Endoscopy;  Laterality: N/A;  . ESOPHAGOGASTRODUODENOSCOPY N/A 12/27/2013   Procedure: ESOPHAGOGASTRODUODENOSCOPY (EGD);  Surgeon: Shann Medal, MD;  Location: [facility];  Service: General;  Laterality: N/A;  . EYE SURGERY    . LAPAROSCOPIC GASTRIC SLEEVE RESECTION N/A 01/20/2014   Procedure: LAPAROSCOPIC GASTRIC SLEEVE RESECTION;  Surgeon: Shann Medal, MD;  Location: [facility];  Service: General;  Laterality: N/A;  . PAROTIDECTOMY  09/07/2012   Procedure: PAROTIDECTOMY;  Surgeon: Melida Quitter, MD;  Location: [facility];  Service: ENT;  Laterality: Left;  LEFT PAROTIDECTOMY  . toe removed - left great toe      [facility]   . TONSILLECTOMY  1968  . TUBAL LIGATION  yrs ago  . UPPER GI ENDOSCOPY  01/20/2014   Procedure: UPPER GI ENDOSCOPY;  Surgeon: Shann Medal, MD;  Location: [facility];  Service: General;;     OB History    Gravida  3   Para  3   Term  3   Preterm      AB      Living  3     SAB      TAB      Ectopic      Multiple      Live Births  3            Home Medications    Prior to Admission medications   Medication Sig Start Date End Date Taking? Authorizing Provider  aspirin 325 MG tablet Take 325 mg by mouth once.   Yes [provider]  Calcium Carbonate-Vitamin D (CALCIUM 600+D) 600-400 MG-UNIT per tablet Take 1 tablet by mouth 2 (two) times daily. 05/06/15  Yes Cherre Robins, PharmD  cholecalciferol (VITAMIN D) 1000 UNITS tablet Take 1,000 Units by mouth daily.    Yes [provider]  clopidogrel (PLAVIX) 75 MG tablet TAKE 1 TABLET BY MOUTH EVERY DAY 12/13/17  Yes Chipper Herb, MD  fenofibrate  (TRICOR) 145 MG tablet TAKE 1 TABLET BY MOUTH ONCE DAILY 01/01/18  Yes Chipper Herb, MD  LIVALO 4 MG TABS TAKE 1 TABLET BY MOUTH ONCE DAILY 02/05/18  Yes Chipper Herb, MD  losartan (COZAAR) 25 MG tablet TAKE 1 TABLET BY MOUTH EVERY DAY 12/26/17  Yes Chipper Herb, MD  meclizine (ANTIVERT) 12.5 MG tablet Take 1 tablet (12.5 mg total) by mouth 3 (three) times daily as needed for dizziness. 01/30/15  Yes Chipper Herb, MD  metFORMIN (GLUCOPHAGE) 1000 MG tablet Take 1 Tablet by mouth 2  times a day 05/22/17  Yes Chipper Herb, MD  Multiple Vitamin (MULTIVITAMIN WITH MINERALS) TABS tablet Take 1 tablet by mouth 2 (two) times daily.   Yes [provider]  NOVOLIN 70/30 RELION (70-30) 100 UNIT/ML injection Inject 15-65 Units into the skin See admin instructions. Taking 65 units with breakfast and 15 units at lunch and 65 units with dinner. 07/13/16  Yes [provider]  ondansetron (ZOFRAN ODT) 4 MG disintegrating tablet Take 1 tablet (4 mg total) by mouth every 8 (eight) hours as needed for nausea. 01/03/17  Yes Larene Pickett, PA-C  Propylene Glycol (SYSTANE COMPLETE) 0.6 % SOLN Apply 1 drop to eye as needed (dry eyes).   Yes [provider]  sodium chloride (OCEAN) 0.65 % SOLN nasal spray Place 1 spray into both nostrils as needed for congestion. 11/09/14  Yes Domenic Polite, MD  vitamin E 400 UNIT capsule Take 400 Units by mouth daily.   Yes [provider]  ACCU-CHEK AVIVA PLUS test strip USE TO CHECK BLOOD SUGAR UP TO TWICE DAILY OR AS INSTRUCTED 09/04/17   Chipper Herb, MD  B-D INS SYR ULTRAFINE 1CC/31G 31G X 5/16" 1 ML MISC USE TO INJECT INSULIN TWICE A DAY AS INSTRUCTED 11/16/15   Chipper Herb, MD  Insulin Pen Needle 32G X 4 MM MISC Use to inject insulin with insulin pen 05/17/16   Chipper Herb, MD  Olmesartan-Amlodipine-HCTZ (TRIBENZOR) 40-5-12.5 MG TABS Take 1 tablet by mouth daily.    11/01/11  [provider]    Family History Family  History  Problem Relation Age of Onset  . Heart failure Mother   . Hypertension Mother   . Skin cancer Mother   . Colitis Mother   . Diabetes Mother   . Kidney Stones Mother   . Stroke Mother   . Heart disease Father   . Colon polyps Father   . Diabetes Father   . Heart failure Father   . Heart attack Sister   . Diabetes Other   . Diabetes Daughter   . Stroke Maternal Grandfather   . Diabetes Maternal Grandfather   . Colon cancer Neg Hx     Social History Social History   Tobacco Use  . Smoking status: Never Smoker  . Smokeless tobacco: Never Used  Substance Use Topics  . Alcohol use: Yes    Alcohol/week: 0.0 oz    Comment: rarely  . Drug use: Yes    Types: Other-see comments     Allergies   Atorvastatin; Crestor [rosuvastatin calcium]; Ezetimibe-simvastatin; Celebrex [celecoxib]; Levemir [insulin detemir]; and Penicillins   Review of Systems Review of Systems  All other systems reviewed and are negative.    Physical Exam Updated Vital Signs BP 114/88   Pulse (!) 144   Temp 97.7 F (36.5 C) (Oral)   Resp (!) 26   Ht 5\' 7"  (1.702 m)   Wt 94.3 kg (208 lb)   SpO2 91%   BMI 32.58 kg/m   Physical Exam  Constitutional: She is oriented to person, place, and time. She appears well-developed and well-nourished. No distress.  HENT:  Head: Normocephalic and atraumatic.  Eyes: Pupils are equal, round, and reactive to light. Conjunctivae and EOM are normal.  Neck: Normal range of motion and phonation normal. Neck supple.  Cardiovascular:  Irregularly  tachycardia  Pulmonary/Chest: Effort normal and breath sounds normal. She exhibits no tenderness.  Abdominal: Soft. She exhibits no distension. There is no tenderness. There is no  guarding.  Musculoskeletal: Normal range of motion.  Neurological: She is alert and oriented to person, place, and time. She exhibits normal muscle tone.  Skin: Skin is warm and dry. No erythema. No pallor.  Psychiatric: She has a  normal mood and affect. Her behavior is normal. Judgment and thought content normal.  Nursing note and vitals reviewed.    ED Treatments / Results  Labs (all labs ordered are listed, but only abnormal results are displayed) Labs Reviewed  HEPATIC FUNCTION PANEL - Abnormal; Notable for the following components:      Result Value   Total Protein 5.5 (*)    Albumin 3.2 (*)    Alkaline Phosphatase 34 (*)    All other components within normal limits  URINALYSIS, ROUTINE W REFLEX MICROSCOPIC - Abnormal; Notable for the following components:   Glucose, UA >=500 (*)    Ketones, ur 5 (*)    All other components within normal limits  I-STAT CHEM 8, ED - Abnormal; Notable for the following components:   Chloride 112 (*)    BUN 27 (*)    Creatinine, Ser 1.20 (*)    Glucose, Bld 390 (*)    Calcium, Ion 1.02 (*)    TCO2 18 (*)    All other components within normal limits  CBC WITH DIFFERENTIAL/PLATELET  I-STAT TROPONIN, ED    EKG None     Date: 02/13/18  Rate: 127  Rhythm: atrial fibrillation  QRS Axis: normal  PR and QT Intervals: normal QT  ST/T Wave abnormalities: nonspecific ST changes  PR and QRS Conduction Disutrbances: Normal QT  Narrative Interpretation: PVC present  Old EKG Reviewed: changes noted  This patients CHA2DS2-VASc Score and unadjusted Ischemic Stroke Rate (% per year) is equal to 9.7 % stroke rate/year from a score of 6; for age, female status, hypertension, TIA and diabetes  Above score calculated as 1 point each if present [CHF, HTN, DM, Vascular=MI/PAD/Aortic Plaque, Age if 31-74, or Female] Above score calculated as 2 points each if present [Age > 75, or Stroke/TIA/TE]   Radiology Dg Chest Port 1 View  Result Date: 02/14/2018 CLINICAL DATA:  Chest pain with palpitations EXAM: PORTABLE CHEST 1 VIEW COMPARISON:  Jan 08, 2014 FINDINGS: There is no edema or consolidation. Heart is mildly enlarged with pulmonary vascularity normal. No adenopathy. There is  aortic atherosclerosis. No evident bone lesions. No pneumothorax. IMPRESSION: No edema or consolidation. Mild cardiac enlargement. There is aortic atherosclerosis. Aortic Atherosclerosis (ICD10-I70.0). Electronically Signed   By: Lowella Grip III M.D.   On: 02/14/2018 11:54    Procedures .Critical Care Performed by: Daleen Bo, MD Authorized by: Daleen Bo, MD   Critical care provider statement:    Critical care time (minutes):  35   Critical care start time:  02/14/2018 11:33 AM   Critical care end time:  02/14/2018 1:57 PM   Critical care time was exclusive of:  Separately billable procedures and treating other patients   Critical care was necessary to treat or prevent imminent or life-threatening deterioration of the following conditions:  Cardiac failure   Critical care was time spent personally by me on the following activities:  Blood draw for specimens, development of treatment plan with patient or surrogate, discussions with consultants, evaluation of patient's response to treatment, examination of patient, obtaining history from patient or surrogate, ordering and performing treatments and interventions, ordering and review of laboratory studies, pulse oximetry, re-evaluation of patient's condition, review of old charts and ordering and review of radiographic  studies   (including critical care time)  Medications Ordered in ED Medications  0.9 %  sodium chloride infusion ( Intravenous New Bag/Given 02/14/18 1300)  sodium chloride 0.9 % bolus 500 mL (500 mLs Intravenous New Bag/Given 02/14/18 1410)  diltiazem (CARDIZEM) 100 mg in dextrose 5% 133mL (1 mg/mL) infusion (15 mg/hr Intravenous Rate/Dose Change 02/14/18 1323)  sodium chloride 0.9 % bolus 500 mL (0 mLs Intravenous Stopped 02/14/18 1300)  metoprolol tartrate (LOPRESSOR) injection 5 mg (5 mg Intravenous Given 02/14/18 1303)  diltiazem (CARDIZEM) 1 mg/mL load via infusion 10 mg (10 mg Intravenous Bolus from Bag 02/14/18 1230)      Initial Impression / Assessment and Plan / ED Course  I have reviewed the triage vital signs and the nursing notes.  Pertinent labs & imaging results that were available during my care of the patient were reviewed by me and considered in my medical decision making (see chart for details).  Clinical Course as of Feb 14 1437  Wed Feb 14, 2018  1341 Requested callback from cardiology, to discuss case and ongoing treatment, with consultation.   [EW]  1357 Cardiology will evaluate in ED.   [EW]    Clinical Course User Index [EW] Daleen Bo, MD     Patient Vitals for the past 24 hrs:  BP Temp Temp src Pulse Resp SpO2 Height Weight  02/14/18 1400 114/88 - - (!) 144 (!) 26 91 % - -  02/14/18 1330 129/87 - - (!) 50 (!) 27 91 % - -  02/14/18 1315 115/79 - - 73 (!) 21 91 % - -  02/14/18 1305 105/72 - - (!) 153 (!) 29 95 % - -  02/14/18 1300 (!) 144/131 - - 81 (!) 28 91 % - -  02/14/18 1230 113/77 - - (!) 31 (!) 24 95 % - -  02/14/18 1215 (!) 105/92 - - (!) 132 (!) 22 93 % - -  02/14/18 1200 (!) 130/93 - - (!) 46 (!) 29 92 % - -  02/14/18 1133 - - - - - - 5\' 7"  (1.702 m) 94.3 kg (208 lb)  02/14/18 1131 (!) 147/76 97.7 F (36.5 C) Oral 62 (!) 24 91 % - -  02/14/18 1128 - - - - - 97 % - -    1:39 PM Reevaluation with update and discussion. After initial assessment and treatment, an updated evaluation reveals she feels better at this time after treatment with Cardizem, bolus and drip, and Lopressor bolus.  She states that her chest pain has resolved completely.  Patient and family member updated on findings and plan.Daleen Bo   Medical Decision Making: New-onset atrial fibrillation, with RVR  CRITICAL CARE-yes Performed by: Daleen Bo   Nursing Notes Reviewed/ Care Coordinated Applicable Imaging Reviewed Interpretation of Laboratory Data incorporated into ED treatment   Plan: Admit   Final Clinical Impressions(s) / ED Diagnoses   Final diagnoses:  Atrial  fibrillation with RVR ([place])  Chest pain, unspecified type    ED Discharge Orders    None       Daleen Bo, MD 02/14/18 1438

## 2018-02-14 NOTE — ED Triage Notes (Signed)
EMS called for patient feeling "indigustion" all week, eating tums, etc. 20 dilt and 4 zofran with 1300 NS given en route.

## 2018-02-15 DIAGNOSIS — I4891 Unspecified atrial fibrillation: Secondary | ICD-10-CM | POA: Diagnosis not present

## 2018-02-15 DIAGNOSIS — E785 Hyperlipidemia, unspecified: Secondary | ICD-10-CM | POA: Diagnosis not present

## 2018-02-15 DIAGNOSIS — M810 Age-related osteoporosis without current pathological fracture: Secondary | ICD-10-CM | POA: Diagnosis not present

## 2018-02-15 DIAGNOSIS — I4892 Unspecified atrial flutter: Secondary | ICD-10-CM | POA: Diagnosis not present

## 2018-02-15 DIAGNOSIS — Z794 Long term (current) use of insulin: Secondary | ICD-10-CM

## 2018-02-15 DIAGNOSIS — E1169 Type 2 diabetes mellitus with other specified complication: Secondary | ICD-10-CM

## 2018-02-15 DIAGNOSIS — I10 Essential (primary) hypertension: Secondary | ICD-10-CM

## 2018-02-15 DIAGNOSIS — I495 Sick sinus syndrome: Secondary | ICD-10-CM | POA: Diagnosis not present

## 2018-02-15 DIAGNOSIS — N183 Chronic kidney disease, stage 3 (moderate): Secondary | ICD-10-CM | POA: Diagnosis not present

## 2018-02-15 DIAGNOSIS — I350 Nonrheumatic aortic (valve) stenosis: Secondary | ICD-10-CM

## 2018-02-15 DIAGNOSIS — E1122 Type 2 diabetes mellitus with diabetic chronic kidney disease: Secondary | ICD-10-CM | POA: Diagnosis not present

## 2018-02-15 DIAGNOSIS — E1121 Type 2 diabetes mellitus with diabetic nephropathy: Secondary | ICD-10-CM | POA: Diagnosis not present

## 2018-02-15 DIAGNOSIS — I2510 Atherosclerotic heart disease of native coronary artery without angina pectoris: Secondary | ICD-10-CM

## 2018-02-15 DIAGNOSIS — I313 Pericardial effusion (noninflammatory): Secondary | ICD-10-CM | POA: Diagnosis not present

## 2018-02-15 DIAGNOSIS — I129 Hypertensive chronic kidney disease with stage 1 through stage 4 chronic kidney disease, or unspecified chronic kidney disease: Secondary | ICD-10-CM | POA: Diagnosis not present

## 2018-02-15 DIAGNOSIS — G4733 Obstructive sleep apnea (adult) (pediatric): Secondary | ICD-10-CM | POA: Diagnosis not present

## 2018-02-15 DIAGNOSIS — I214 Non-ST elevation (NSTEMI) myocardial infarction: Secondary | ICD-10-CM

## 2018-02-15 DIAGNOSIS — K219 Gastro-esophageal reflux disease without esophagitis: Secondary | ICD-10-CM | POA: Diagnosis not present

## 2018-02-15 LAB — GLUCOSE, CAPILLARY
Glucose-Capillary: 393 mg/dL — ABNORMAL HIGH (ref 70–99)
Glucose-Capillary: 278 mg/dL — ABNORMAL HIGH (ref 70–99)
Glucose-Capillary: 325 mg/dL — ABNORMAL HIGH (ref 70–99)
Glucose-Capillary: 386 mg/dL — ABNORMAL HIGH (ref 70–99)
Glucose-Capillary: 317 mg/dL — ABNORMAL HIGH (ref 70–99)
Glucose-Capillary: 361 mg/dL — ABNORMAL HIGH (ref 70–99)

## 2018-02-15 LAB — TROPONIN I
Troponin I: 7.1 ng/mL (ref <0.03)
Troponin I: 4.51 ng/mL (ref <0.03)

## 2018-02-15 LAB — APTT: aPTT: 38 seconds — ABNORMAL HIGH (ref 24–36)

## 2018-02-15 LAB — CBC
HCT: 40.2 % (ref 36.0–46.0)
Hemoglobin: 12.9 g/dL (ref 12.0–15.0)
MCH: 27.6 pg (ref 26.0–34.0)
MCHC: 32.1 g/dL (ref 30.0–36.0)
MCV: 86.1 fL (ref 78.0–100.0)
Platelets: 275 10*3/uL (ref 150–400)
RBC: 4.67 MIL/uL (ref 3.87–5.11)
RDW: 14.2 % (ref 11.5–15.5)
WBC: 10.5 10*3/uL (ref 4.0–10.5)

## 2018-02-15 LAB — BASIC METABOLIC PANEL
Anion gap: 8 (ref 5–15)
BUN: 30 mg/dL — ABNORMAL HIGH (ref 8–23)
CO2: 24 mmol/L (ref 22–32)
Calcium: 8.9 mg/dL (ref 8.9–10.3)
Chloride: 108 mmol/L (ref 98–111)
Creatinine, Ser: 1.57 mg/dL — ABNORMAL HIGH (ref 0.44–1.00)
GFR calc Af Amer: 38 mL/min — ABNORMAL LOW (ref >60)
GFR calc non Af Amer: 33 mL/min — ABNORMAL LOW (ref >60)
Glucose, Bld: 324 mg/dL — ABNORMAL HIGH (ref 70–99)
Potassium: 4.5 mmol/L (ref 3.5–5.1)
Sodium: 140 mmol/L (ref 135–145)

## 2018-02-15 LAB — LIPID PANEL
Cholesterol: 194 mg/dL (ref 0–200)
HDL: 52 mg/dL (ref >40)
LDL Cholesterol: 98 mg/dL (ref 0–99)
Total CHOL/HDL Ratio: 3.7 RATIO
Triglycerides: 222 mg/dL — ABNORMAL HIGH (ref <150)
VLDL: 44 mg/dL — ABNORMAL HIGH (ref 0–40)

## 2018-02-15 LAB — HEPARIN LEVEL (UNFRACTIONATED): Heparin Unfractionated: 1.07 IU/mL — ABNORMAL HIGH (ref 0.30–0.70)

## 2018-02-15 MED ORDER — FUROSEMIDE 10 MG/ML IJ SOLN
20.0000 mg | Freq: Once | INTRAMUSCULAR | Status: AC
Start: 2018-02-15 — End: 2018-02-15
  Administered 2018-02-15: 20 mg via INTRAVENOUS
  Filled 2018-02-15: qty 2

## 2018-02-15 MED ORDER — METOPROLOL TARTRATE 25 MG PO TABS
25.0000 mg | ORAL_TABLET | Freq: Once | ORAL | Status: AC
  Administered 2018-02-15: 25 mg via ORAL
  Filled 2018-02-15: qty 1

## 2018-02-15 MED ORDER — AMIODARONE HCL 200 MG PO TABS
400.0000 mg | ORAL_TABLET | Freq: Two times a day (BID) | ORAL | Status: DC
  Administered 2018-02-15 (×2): 400 mg via ORAL
  Filled 2018-02-15 (×2): qty 2

## 2018-02-15 MED ORDER — METOPROLOL TARTRATE 25 MG/10 ML ORAL SUSPENSION: 25.0000 mg | Freq: Once | ORAL | Status: DC

## 2018-02-15 MED ORDER — INSULIN ASPART 100 UNIT/ML ~~LOC~~ SOLN
0.0000 [IU] | Freq: Three times a day (TID) | SUBCUTANEOUS | Status: DC
  Administered 2018-02-15 – 2018-02-16 (×3): 20 [IU] via SUBCUTANEOUS
  Administered 2018-02-16: 15 [IU] via SUBCUTANEOUS
  Administered 2018-02-16: 20 [IU] via SUBCUTANEOUS
  Administered 2018-02-17: 15 [IU] via SUBCUTANEOUS
  Administered 2018-02-17: 7 [IU] via SUBCUTANEOUS
  Administered 2018-02-17: 15 [IU] via SUBCUTANEOUS
  Administered 2018-02-18 (×2): 11 [IU] via SUBCUTANEOUS
  Administered 2018-02-18: 7 [IU] via SUBCUTANEOUS
  Administered 2018-02-19: 20 [IU] via SUBCUTANEOUS
  Administered 2018-02-19: 7 [IU] via SUBCUTANEOUS

## 2018-02-15 MED ORDER — INSULIN GLARGINE 100 UNIT/ML ~~LOC~~ SOLN
15.0000 [IU] | Freq: Two times a day (BID) | SUBCUTANEOUS | Status: DC
  Administered 2018-02-15 – 2018-02-19 (×9): 15 [IU] via SUBCUTANEOUS
  Filled 2018-02-15 (×10): qty 0.15

## 2018-02-15 MED ORDER — HEPARIN (PORCINE) IN NACL 100-0.45 UNIT/ML-% IJ SOLN
1600.0000 [IU]/h | INTRAMUSCULAR | Status: DC
  Administered 2018-02-15: 1150 [IU]/h via INTRAVENOUS
  Administered 2018-02-16: 1600 [IU]/h via INTRAVENOUS
  Filled 2018-02-15 (×2): qty 250

## 2018-02-15 MED ORDER — METOPROLOL TARTRATE 5 MG/5ML IV SOLN
5.0000 mg | Freq: Once | INTRAVENOUS | Status: AC
  Administered 2018-02-15: 5 mg via INTRAVENOUS
  Filled 2018-02-15: qty 5

## 2018-02-15 NOTE — Progress Notes (Signed)
ANTICOAGULATION CONSULT NOTE  Pharmacy Consult:  Heparin Indication: chest pain/ACS and atrial fibrillation  Allergies  Allergen Reactions  . Atorvastatin     Myalgias   . Crestor [Rosuvastatin Calcium] Other (See Comments)    Stiffness and back pain  . Ezetimibe-Simvastatin Other (See Comments)    Leg cramps  . Celebrex [Celecoxib] Rash  . Levemir [Insulin Detemir] Rash  . Penicillins Other (See Comments)    SYNCOPE (Tolerated Ancef) Has patient had a PCN reaction causing immediate rash, facial/tongue/throat swelling, SOB or lightheadedness with hypotension: Yes Has patient had a PCN reaction causing severe rash involving mucus membranes or skin necrosis: No Has patient had a PCN reaction that required hospitalization No Has patient had a PCN reaction occurring within the last 10 years: No If all of the above answers are "NO", then may proceed with Cephalosporin use.    Patient Measurements: Height: 5\' 7"  (170.2 cm) Weight: 219 lb 9.3 oz (99.6 kg) IBW/kg (Calculated) : 61.6 Heparin Dosing Weight: 82 kg  Vital Signs: BP: 123/67 (07/04 1644) Pulse Rate: 151 (07/04 1644)  Labs: Recent Labs    02/14/18 1139 02/14/18 1158 02/14/18 1937 02/15/18 0239 02/15/18 0742 02/15/18 1534  HGB 12.4 12.2 13.4 12.9  --   --   HCT 38.9 36.0 42.2 40.2  --   --   PLT 207  --  264 275  --   --   APTT  --   --   --   --   --  38*  HEPARINUNFRC  --   --   --   --   --  1.07*  CREATININE  --  1.20* 1.64* 1.57*  --   --   TROPONINI  --   --  7.11* 7.10* 4.51*  --     Estimated Creatinine Clearance: 42.2 mL/min (A) (by C-G formula based on SCr of 1.57 mg/dL (H)).     Assessment: 60 yof on apixaban PTA for atrial fibrillation. Troponin overnight have increased from 0.07>7.11>4.51. Scr trending upwards from 1.2 to 1.57 today.  Last dose of apixaban was on 7/3 at 1858.  Hgb 12.9, plt 275. No s/sx of bleeding. Given recent timing of apixaban dosing, will not bolus heparin infusion and  will monitor both aPTT and heparin levels until correlate.   Heparin level elevated as expected.  APTT sub-therapeutic.  No issue with infusion and no bleeding per RN.   Goal of Therapy:  Heparin level 0.3-0.7 units/ml aPTT 66-102 seconds Monitor platelets by anticoagulation protocol: Yes    Plan:  Increase heparin gtt to 1450 units/hr Check 6 hr aPTT   Rolin Schult D. Mina Marble, PharmD, BCPS, [place] 02/15/2018, 5:32 PM

## 2018-02-15 NOTE — Progress Notes (Signed)
Patient complained of having problems breathing.  Assess her lung sounds, having some base fine crackles and hearing wheezing.   Paged Dr. Paticia Stack for orders.  Doctor verbal orders was to give Metropolol 5 mg IV push x one time, Metropolol 25 mg po x one time, and lasix 20 mg IV push x one time.  I will keep monitoring the patient.

## 2018-02-15 NOTE — Progress Notes (Addendum)
ANTICOAGULATION CONSULT NOTE - Initial Consult  Pharmacy Consult for heparin Indication: chest pain/ACS and atrial fibrillation  Allergies  Allergen Reactions  . Atorvastatin     Myalgias   . Crestor [Rosuvastatin Calcium] Other (See Comments)    Stiffness and back pain  . Ezetimibe-Simvastatin Other (See Comments)    Leg cramps  . Celebrex [Celecoxib] Rash  . Levemir [Insulin Detemir] Rash  . Penicillins Other (See Comments)    SYNCOPE (Tolerated Ancef) Has patient had a PCN reaction causing immediate rash, facial/tongue/throat swelling, SOB or lightheadedness with hypotension: Yes Has patient had a PCN reaction causing severe rash involving mucus membranes or skin necrosis: No Has patient had a PCN reaction that required hospitalization No Has patient had a PCN reaction occurring within the last 10 years: No If all of the above answers are "NO", then may proceed with Cephalosporin use.    Patient Measurements: Height: 5\' 7"  (170.2 cm) Weight: 219 lb 9.3 oz (99.6 kg) IBW/kg (Calculated) : 61.6 Heparin Dosing Weight: 82.2 kg  Vital Signs: Temp: 98.1 F (36.7 C) (07/04 0504) Temp Source: Oral (07/04 0504) BP: 111/73 (07/04 0504) Pulse Rate: 143 (07/04 0504)  Labs: Recent Labs    02/14/18 1139 02/14/18 1158 02/14/18 1937 02/15/18 0239 02/15/18 0742  HGB 12.4 12.2 13.4 12.9  --   HCT 38.9 36.0 42.2 40.2  --   PLT 207  --  264 275  --   CREATININE  --  1.20* 1.64* 1.57*  --   TROPONINI  --   --  7.11* 7.10* 4.51*    Estimated Creatinine Clearance: 42.2 mL/min (A) (by C-G formula based on SCr of 1.57 mg/dL (H)).   Medical History: Past Medical History:  Diagnosis Date  . Abnormal Doppler ultrasound of carotid artery    a. 05/2013 - mild plaque, 1-39% BICA, abnormal appearrance of thyroid (TSH wnl in 06/2013).  Marland Kitchen CAD (coronary artery disease)    a. Nonobstructive by cath 2006 - 25% LAD, 25-30% after 1st diag, distal 25% PDA, minor irreg LCx. b. Normal nuc 2011.   . CKD (chronic kidney disease), stage III ([place])   . Cystocele   . Difficult intubation 09-07-2012   big neck trouble with intubation parotid surgery"takes little med to sedate"  . Dyslipidemia   . Esophageal stricture   . GERD (gastroesophageal reflux disease)   . Heart murmur   . Hiatal hernia    a. 07/2013: HH with small stricture holding up barium tablet (concides with patient's sx of food sticking).  . History of kidney stones   . Hyperlipidemia   . Hypertension    dr Percival Spanish  . IBS (irritable bowel syndrome)   . Memory difficulty 11/26/2015  . Morbid obesity ([place])   . Osteoarthritis    "all over" (02/14/2018)  . Osteopenia   . Osteoporosis   . Palpitations    a. PVC/PAC on EKG 08/2013.  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  Marland Kitchen Pericardial effusion    a. Mod by echo 2012 at time of PNA. b. Echo 07/2012: small pericardial effusion vs fat.  Marland Kitchen PONV (postoperative nausea and vomiting)    n/v, and room spinning after cataract and parotid surgery  . Sleep apnea     "have mask; don't use it" (02/14/2018)  . Superficial phlebitis   . TIA (transient ischemic attack) 10/2014; 06/2016   "some memory issues since; daughter says my speech is sometimes different" (02/14/2018)  . Type II diabetes mellitus (  [place])     Medications:  Scheduled:  . insulin aspart  0-15 Units Subcutaneous TID WC  . metoprolol tartrate  25 mg Oral BID  . multivitamin with minerals  1 tablet Oral BID  . pravastatin  80 mg Oral q1800    Assessment: 1 yof on apixaban PTA for atrial fibrillation. Troponin overnight have increased from 0.07>7.11>4.51. Scr trending upwards from 1.2 to 1.57 today.  Last dose of apixaban was on 7/3 at 1858.  Hgb 12.9, plt 275. No s/sx of bleeding. Given recent timing of apixaban dosing, will not bolus heparin infusion and will monitor both aPTT and heparin levels until correlate.   Goal of Therapy:  Heparin level 0.3-0.7 units/ml aPTT 66-102 seconds Monitor  platelets by anticoagulation protocol: Yes   Plan:  Start heparin infusion at 1150 units/hr Check aPTT/anti-Xa level in 6 hours and daily while on heparin Continue to monitor H&H and platelets  Doylene Canard, PharmD Clinical Pharmacist  Pager: [phone] Phone: [phone] 02/15/2018,9:42 AM

## 2018-02-15 NOTE — Progress Notes (Signed)
Progress Note  Patient Name: [name] Date of Encounter: 02/15/2018  Primary Cardiologist: No primary care provider on file.   Subjective   Currently resting comfortably.  Had a rough night last night with some nausea and dyspnea.  Better with Lasix.  But now breathing fine.  No more chest pain.  Inpatient Medications    Scheduled Meds: . insulin aspart  0-15 Units Subcutaneous TID WC  . metoprolol tartrate  25 mg Oral BID  . multivitamin with minerals  1 tablet Oral BID  . pravastatin  80 mg Oral q1800   Continuous Infusions: . sodium chloride 10 mL/hr at 02/15/18 0434  . diltiazem (CARDIZEM) infusion 15 mg/hr (02/15/18 0434)   PRN Meds: acetaminophen, ondansetron (ZOFRAN) IV   Vital Signs    Vitals:   02/15/18 0100 02/15/18 0102 02/15/18 0217 02/15/18 0504  BP: 126/89 126/89 135/83 111/73  Pulse: (!) 147 (!) 147  (!) 143  Resp: (!) 21   19  Temp:    98.1 F (36.7 C)  TempSrc:    Oral  SpO2: 95%   95%  Weight:    219 lb 9.3 oz (99.6 kg)  Height:        Intake/Output Summary (Last 24 hours) at 02/15/2018 0946 Last data filed at 02/15/2018 0434 Gross per 24 hour  Intake 1982.48 ml  Output 1550 ml  Net 432.48 ml   Filed Weights   02/14/18 1133 02/15/18 0504  Weight: 208 lb (94.3 kg) 219 lb 9.3 oz (99.6 kg)    Telemetry    Atrial flutter 2: 1 rate 146- Personally Reviewed  ECG    No new EKG- Personally Reviewed  Physical Exam   Physical Exam  Constitutional: She is oriented to person, place, and time. She appears well-developed. No distress.  Morbidly obese  HENT:  Head: Normocephalic and atraumatic.  Neck: No hepatojugular reflux (Unable to assess) and no JVD (Unable to assess due to body habitus) present. Carotid bruit is not present (Radiated aortic murmur).  Cardiovascular: Regular rhythm, intact distal pulses and normal pulses. Tachycardia present. PMI is not displaced (Unable to palpate). Exam reveals distant heart sounds. Exam reveals no  gallop and no friction rub.  Murmur heard.  Medium-pitched harsh crescendo-decrescendo mid to late systolic murmur is present with a grade of 2/6 at the upper right sternal border radiating to the neck. Difficult to hear murmur because of the tachycardia Pulmonary/Chest: Effort normal and breath sounds normal. No respiratory distress. She has no wheezes. She exhibits no tenderness.  Abdominal: Soft. Bowel sounds are normal. She exhibits no distension. There is no tenderness. There is no rebound.  Musculoskeletal: Normal range of motion. She exhibits no edema.  Neurological: She is alert and oriented to person, place, and time.  Psychiatric: She has a normal mood and affect. Her behavior is normal. Judgment and thought content normal.   Labs    Chemistry Recent Labs  Lab 02/14/18 1139 02/14/18 1158 02/14/18 1937 02/15/18 0239  NA  --  142 142 140  K  --  4.2 5.1 4.5  CL  --  112* 109 108  CO2  --   --  21* 24  GLUCOSE  --  390* 378* 324*  BUN  --  27* 27* 30*  CREATININE  --  1.20* 1.64* 1.57*  CALCIUM  --   --  8.5* 8.9  PROT 5.5*  --   --   --   ALBUMIN 3.2*  --   --   --  AST 39  --   --   --   ALT 28  --   --   --   ALKPHOS 34*  --   --   --   BILITOT 0.8  --   --   --   GFRNONAA  --   --  31* 33*  GFRAA  --   --  36* 38*  ANIONGAP  --   --  12 8     Hematology Recent Labs  Lab 02/14/18 1139 02/14/18 1158 02/14/18 1937 02/15/18 0239  WBC 7.7  --  9.4 10.5  RBC 4.48  --  4.86 4.67  HGB 12.4 12.2 13.4 12.9  HCT 38.9 36.0 42.2 40.2  MCV 86.8  --  86.8 86.1  MCH 27.7  --  27.6 27.6  MCHC 31.9  --  31.8 32.1  RDW 13.8  --  13.9 14.2  PLT 207  --  264 275    Cardiac Enzymes Recent Labs  Lab 02/14/18 1937 02/15/18 0239 02/15/18 0742  TROPONINI 7.11* 7.10* 4.51*    Recent Labs  Lab 02/14/18 1157  TROPIPOC 0.07     BNPNo results for input(s): BNP, PROBNP in the last 168 hours.   DDimer No results for input(s): DDIMER in the last 168 hours.    Radiology    Dg Chest Port 1 View  Result Date: 02/14/2018 CLINICAL DATA:  Chest pain with palpitations EXAM: PORTABLE CHEST 1 VIEW COMPARISON:  Jan 08, 2014 FINDINGS: There is no edema or consolidation. Heart is mildly enlarged with pulmonary vascularity normal. No adenopathy. There is aortic atherosclerosis. No evident bone lesions. No pneumothorax. IMPRESSION: No edema or consolidation. Mild cardiac enlargement. There is aortic atherosclerosis. Aortic Atherosclerosis (ICD10-I70.0). Electronically Signed   By: Lowella Grip III M.D.   On: 02/14/2018 11:54    Cardiac Studies    Echocardiogram November 2018: EF 60 to 65% with moderate LVH.  GR 1 DD.  No RWM severe calcified aortic valve with moderate to severe aortic stenosis (mean gradient 36 mmHg) mild MAC with trivial MR.. Mildly elevated PA pressures.  Small pericardial effusion.  Cardiac Cath May 2006: Ostial LAD 25%.  D1 2530%.  25% RPAV  Patient Profile     67 y.o. female with a hx of nonobstructive CAD (per cath 2006, normal nuc 2011), CKD stage III, dyslipidemia, hypertension, DM 2, moderate AS, carotid stenosis, known small pericardial effusion (per echo 2013l), stroke/TIA and OSA not on CPAP --admitted for new onset atrial fibrillation along with chest discomfort. Initially started on IV diltiazem drip and oral beta-blocker along with DOAC.  And subsequently ruled in with troponin of ~7.  Assessment & Plan    Principal Problem:   Atrial fibrillation/flutter, new onset ([place]) Active Problems:   Non-STEMI (non-ST elevated myocardial infarction) [facility]): troponin increased to ~with chest pain in setting of new onset A. fib.  7   Atrial flutter with rapid ventricular response (HCC)   Hyperlipidemia associated with type 2 diabetes mellitus ([place])   TIA (transient ischemic attack), history of   Moderate aortic stenosis by prior echocardiogram   Benign essential HTN   Coronary artery disease, non-occlusive   Type II diabetes  mellitus with renal manifestations (HCC)  Principal Problem:   Atrial fibrillation/flutter, new onset ([place]) / Atrial flutter with rapid ventricular response ([place])  Initially in A. fib RVR with rates in the 180s, now appears to be in steady atrial flutter with rate in the 140s with 2: 1 conduction.  TSH normal  Remains on IV diltiazem drip and oral beta-blocker -> will titrate beta-blocker dose today, but not likely able to  control atrial flutter rate until cardioversion.  In an effort to chemically cardiovert will give oral amiodarone 400 mg  She had some dyspnea last night, need to monitor for signs of heart failure. -May require PRN Lasix (had one dose last night)    Non-STEMI (non-ST elevated myocardial infarction) [facility]): troponin increased to ~with chest pain in setting of new onset A. fib.  7-- h/o Coronary artery disease, non-occlusive   In light of pretty significant increase in troponin and her presenting with chest pain, we will convert from [place] to IV heparin.  The plan was cardioversion tomorrow if he remains in rapid atrial flutter.  However would also consider the possibility of cardiac catheterization during this hospitalization.  Will make n.p.o. after midnight.  Minimize lack of anticoagulant coverage.    Need to determine timing of cardiac catheterization in order to minimize pauses and anticoagulation.    Hyperlipidemia associated with type 2 diabetes mellitus (HCC) /Type II diabetes mellitus with renal manifestations (HCC) --A1c 10.1  Pravastatin ordered. Has a documented statin intolerance and was on fenofibrate at home -> may need PCSK9 inhibitor +/- Vascepa with elevated triglycerides.  LDL 98  Resistant sliding-scale insulin -adjust as necessary, may need 24-hour insulin as well (ordered Lantus 15 twice daily)      Moderate aortic stenosis by prior echocardiogram - > will evaluate with TEE/cardioversion --Could be cause for positive troponin.    Benign  essential HTN: Controlled with current IV and oral medications.      TIA (transient ischemic attack), history of - involved in CHA2DS2Vasc Score   This patients CHA2DS2-VASc Score and unadjusted Ischemic Stroke Rate (% per year) is equal to 11.2 % stroke rate/year from a score of 7 Above score calculated as 1 point each if present [CHF, HTN, DM, Vascular=MI/PAD/Aortic Plaque, Age if 42-74, or Female]; 2 points each if present [Age > 75, or Stroke/TIA/TE]   For questions or updates, please contact [facility] Please consult [email or web address] for contact info under Cardiology/STEMI.      Signed, Glenetta Hew, MD  02/15/2018, 9:46 AM

## 2018-02-15 NOTE — Progress Notes (Signed)
Patient c/o feeling faint when using the [facility].   Patient's daughter stated that it could be because she got up the bed fast to use the [facility] to void.  Asked patient is she was feeling nauseated, patient stated yes.  Gave some Zofran for nausea, checked BP 135/83, CBG 278.  I recomended for patient to try the purwick again, so she would not be getting up since, she did have some Lasix tonight.  Patient and daughter agree.  I will keep monitoring the patient.

## 2018-02-15 NOTE — Progress Notes (Signed)
MD on call notified of pt's sustaining at 150s bpm in aflutter. Pt asymptomatic & in bed. Pt already diltiazem. Po amio added today as well. New orders for lopressor 5 mg once & RN will give pt's scheduled po lopressor & amio now. Will continue to monitor pt & update MD. Hoover Brunette, RN

## 2018-02-15 NOTE — Consult Note (Signed)
[place] Nurse wound consult note Reason for Consult: wound on bottom Patient reports skin changes at the coccyx for "years" has been followed by dermatology for this problem Wound type: skin changes, no open wound, chronic in nature. Etiology unknown Pressure Injury POA:  No drainage, reddened area but blanches, deeper in the apex of the gluteal skin fold. No other redness noted on the buttocks to suggest MASD. Patient has Purewick in place for management of urinary incontinence at the present time.  No topical wound care needed. Ok to protect affected area with silicone foam. Bedside nurses have implemented this per the skin care order set prior to my assessment.  Discussed POC with patient and bedside nurse.  Re consult if needed, will not follow at this time. Thanks  Empress Newmann [employer], RN,CWOCN, CNS, [place] [phone])

## 2018-02-16 ENCOUNTER — Inpatient Hospital Stay (HOSPITAL_COMMUNITY): Payer: Medicare Other

## 2018-02-16 ENCOUNTER — Inpatient Hospital Stay (HOSPITAL_COMMUNITY): Payer: Medicare Other | Admitting: Anesthesiology

## 2018-02-16 ENCOUNTER — Encounter (HOSPITAL_COMMUNITY)
Admission: EM | Disposition: A | Discharge status: Home / Self Care | Payer: Self-pay | Source: Home / Self Care | Attending: Cardiovascular Disease

## 2018-02-16 ENCOUNTER — Encounter (HOSPITAL_COMMUNITY): Payer: Self-pay | Admitting: *Deleted

## 2018-02-16 DIAGNOSIS — I483 Typical atrial flutter: Secondary | ICD-10-CM | POA: Diagnosis not present

## 2018-02-16 DIAGNOSIS — I2510 Atherosclerotic heart disease of native coronary artery without angina pectoris: Secondary | ICD-10-CM | POA: Diagnosis not present

## 2018-02-16 DIAGNOSIS — M810 Age-related osteoporosis without current pathological fracture: Secondary | ICD-10-CM | POA: Diagnosis present

## 2018-02-16 DIAGNOSIS — Z8673 Personal history of transient ischemic attack (TIA), and cerebral infarction without residual deficits: Secondary | ICD-10-CM | POA: Diagnosis not present

## 2018-02-16 DIAGNOSIS — I060 Rheumatic aortic stenosis: Secondary | ICD-10-CM

## 2018-02-16 DIAGNOSIS — I350 Nonrheumatic aortic (valve) stenosis: Secondary | ICD-10-CM | POA: Diagnosis not present

## 2018-02-16 DIAGNOSIS — N183 Chronic kidney disease, stage 3 (moderate): Secondary | ICD-10-CM | POA: Diagnosis present

## 2018-02-16 DIAGNOSIS — I129 Hypertensive chronic kidney disease with stage 1 through stage 4 chronic kidney disease, or unspecified chronic kidney disease: Secondary | ICD-10-CM | POA: Diagnosis present

## 2018-02-16 DIAGNOSIS — K219 Gastro-esophageal reflux disease without esophagitis: Secondary | ICD-10-CM | POA: Diagnosis present

## 2018-02-16 DIAGNOSIS — I10 Essential (primary) hypertension: Secondary | ICD-10-CM | POA: Diagnosis not present

## 2018-02-16 DIAGNOSIS — Z7982 Long term (current) use of aspirin: Secondary | ICD-10-CM | POA: Diagnosis not present

## 2018-02-16 DIAGNOSIS — I080 Rheumatic disorders of both mitral and aortic valves: Secondary | ICD-10-CM | POA: Diagnosis not present

## 2018-02-16 DIAGNOSIS — M1990 Unspecified osteoarthritis, unspecified site: Secondary | ICD-10-CM | POA: Diagnosis present

## 2018-02-16 DIAGNOSIS — I495 Sick sinus syndrome: Secondary | ICD-10-CM | POA: Diagnosis not present

## 2018-02-16 DIAGNOSIS — E1169 Type 2 diabetes mellitus with other specified complication: Secondary | ICD-10-CM | POA: Diagnosis not present

## 2018-02-16 DIAGNOSIS — R05 Cough: Secondary | ICD-10-CM | POA: Diagnosis not present

## 2018-02-16 DIAGNOSIS — Z794 Long term (current) use of insulin: Secondary | ICD-10-CM | POA: Diagnosis not present

## 2018-02-16 DIAGNOSIS — K449 Diaphragmatic hernia without obstruction or gangrene: Secondary | ICD-10-CM | POA: Diagnosis present

## 2018-02-16 DIAGNOSIS — Z833 Family history of diabetes mellitus: Secondary | ICD-10-CM | POA: Diagnosis not present

## 2018-02-16 DIAGNOSIS — I4891 Unspecified atrial fibrillation: Secondary | ICD-10-CM | POA: Diagnosis not present

## 2018-02-16 DIAGNOSIS — E1121 Type 2 diabetes mellitus with diabetic nephropathy: Secondary | ICD-10-CM | POA: Diagnosis not present

## 2018-02-16 DIAGNOSIS — I4892 Unspecified atrial flutter: Secondary | ICD-10-CM | POA: Diagnosis not present

## 2018-02-16 DIAGNOSIS — K589 Irritable bowel syndrome without diarrhea: Secondary | ICD-10-CM | POA: Diagnosis present

## 2018-02-16 DIAGNOSIS — E1122 Type 2 diabetes mellitus with diabetic chronic kidney disease: Secondary | ICD-10-CM | POA: Diagnosis present

## 2018-02-16 DIAGNOSIS — G459 Transient cerebral ischemic attack, unspecified: Secondary | ICD-10-CM | POA: Diagnosis not present

## 2018-02-16 DIAGNOSIS — Z7902 Long term (current) use of antithrombotics/antiplatelets: Secondary | ICD-10-CM | POA: Diagnosis not present

## 2018-02-16 DIAGNOSIS — G4733 Obstructive sleep apnea (adult) (pediatric): Secondary | ICD-10-CM | POA: Diagnosis present

## 2018-02-16 DIAGNOSIS — I214 Non-ST elevation (NSTEMI) myocardial infarction: Secondary | ICD-10-CM | POA: Diagnosis not present

## 2018-02-16 DIAGNOSIS — I313 Pericardial effusion (noninflammatory): Secondary | ICD-10-CM | POA: Diagnosis present

## 2018-02-16 DIAGNOSIS — E785 Hyperlipidemia, unspecified: Secondary | ICD-10-CM | POA: Diagnosis not present

## 2018-02-16 DIAGNOSIS — Z8249 Family history of ischemic heart disease and other diseases of the circulatory system: Secondary | ICD-10-CM | POA: Diagnosis not present

## 2018-02-16 DIAGNOSIS — I6529 Occlusion and stenosis of unspecified carotid artery: Secondary | ICD-10-CM | POA: Diagnosis present

## 2018-02-16 HISTORY — PX: CARDIOVERSION: SHX1299

## 2018-02-16 HISTORY — PX: TEE WITHOUT CARDIOVERSION: SHX5443

## 2018-02-16 LAB — CBC
HCT: 40 % (ref 36.0–46.0)
Hemoglobin: 12.7 g/dL (ref 12.0–15.0)
MCH: 27.4 pg (ref 26.0–34.0)
MCHC: 31.8 g/dL (ref 30.0–36.0)
MCV: 86.2 fL (ref 78.0–100.0)
Platelets: 245 10*3/uL (ref 150–400)
RBC: 4.64 MIL/uL (ref 3.87–5.11)
RDW: 14.1 % (ref 11.5–15.5)
WBC: 11.7 10*3/uL — ABNORMAL HIGH (ref 4.0–10.5)

## 2018-02-16 LAB — GLUCOSE, CAPILLARY
Glucose-Capillary: 370 mg/dL — ABNORMAL HIGH (ref 70–99)
Glucose-Capillary: 366 mg/dL — ABNORMAL HIGH (ref 70–99)
Glucose-Capillary: 369 mg/dL — ABNORMAL HIGH (ref 70–99)
Glucose-Capillary: 336 mg/dL — ABNORMAL HIGH (ref 70–99)
Glucose-Capillary: 345 mg/dL — ABNORMAL HIGH (ref 70–99)
Glucose-Capillary: 346 mg/dL — ABNORMAL HIGH (ref 70–99)
Glucose-Capillary: 315 mg/dL — ABNORMAL HIGH (ref 70–99)

## 2018-02-16 LAB — APTT
aPTT: 50 seconds — ABNORMAL HIGH (ref 24–36)
aPTT: 73 seconds — ABNORMAL HIGH (ref 24–36)
aPTT: 79 seconds — ABNORMAL HIGH (ref 24–36)

## 2018-02-16 LAB — HIV ANTIBODY (ROUTINE TESTING W REFLEX): HIV Screen 4th Generation wRfx: NONREACTIVE

## 2018-02-16 LAB — HEPARIN LEVEL (UNFRACTIONATED): Heparin Unfractionated: 1.12 IU/mL — ABNORMAL HIGH (ref 0.30–0.70)

## 2018-02-16 SURGERY — CARDIOVERSION
Anesthesia: General

## 2018-02-16 MED ORDER — SODIUM CHLORIDE 0.9 % IV SOLN: 250.0000 mL | INTRAVENOUS | Status: DC

## 2018-02-16 MED ORDER — CALCIUM CHLORIDE 10 % IV SOLN
INTRAVENOUS | Status: DC | PRN
  Administered 2018-02-16 (×7): 100 mg via INTRAVENOUS
  Administered 2018-02-16: 200 mg via INTRAVENOUS
  Administered 2018-02-16: 100 mg via INTRAVENOUS

## 2018-02-16 MED ORDER — BUTAMBEN-TETRACAINE-BENZOCAINE 2-2-14 % EX AERO
INHALATION_SPRAY | CUTANEOUS | Status: DC | PRN
  Administered 2018-02-16: 2 via TOPICAL

## 2018-02-16 MED ORDER — SODIUM CHLORIDE 0.9 % IV SOLN
INTRAVENOUS | Status: DC | PRN
  Administered 2018-02-16: 14:00:00 via INTRAVENOUS

## 2018-02-16 MED ORDER — SODIUM CHLORIDE 0.9% FLUSH
3.0000 mL | Freq: Two times a day (BID) | INTRAVENOUS | Status: DC
  Administered 2018-02-16 – 2018-02-19 (×6): 3 mL via INTRAVENOUS

## 2018-02-16 MED ORDER — SODIUM CHLORIDE 0.9% FLUSH
3.0000 mL | INTRAVENOUS | Status: DC | PRN
Start: 2018-02-16 — End: 2018-02-19

## 2018-02-16 MED ORDER — AMIODARONE HCL IN DEXTROSE 360-4.14 MG/200ML-% IV SOLN
60.0000 mg/h | INTRAVENOUS | Status: AC
  Administered 2018-02-16 (×2): 60 mg/h via INTRAVENOUS
  Filled 2018-02-16 (×2): qty 200

## 2018-02-16 MED ORDER — PROPOFOL 10 MG/ML IV BOLUS
INTRAVENOUS | Status: DC | PRN
  Administered 2018-02-16: 20 mg via INTRAVENOUS
  Administered 2018-02-16: 10 mg via INTRAVENOUS
  Administered 2018-02-16 (×2): 20 mg via INTRAVENOUS
  Administered 2018-02-16: 10 mg via INTRAVENOUS
  Administered 2018-02-16: 20 mg via INTRAVENOUS

## 2018-02-16 MED ORDER — DEXMEDETOMIDINE HCL 200 MCG/2ML IV SOLN
INTRAVENOUS | Status: DC | PRN
  Administered 2018-02-16: 4 ug via INTRAVENOUS
  Administered 2018-02-16: 8 ug via INTRAVENOUS
  Administered 2018-02-16: 4 ug via INTRAVENOUS
  Administered 2018-02-16: 8 ug via INTRAVENOUS

## 2018-02-16 MED ORDER — SODIUM CHLORIDE 0.9 % IV SOLN
INTRAVENOUS | Status: DC
  Administered 2018-02-16: 13:00:00 via INTRAVENOUS

## 2018-02-16 MED ORDER — PHENYLEPHRINE 40 MCG/ML (10ML) SYRINGE FOR IV PUSH (FOR BLOOD PRESSURE SUPPORT)
PREFILLED_SYRINGE | INTRAVENOUS | Status: DC | PRN
  Administered 2018-02-16 (×3): 80 ug via INTRAVENOUS

## 2018-02-16 MED ORDER — INSULIN ASPART 100 UNIT/ML IV SOLN
10.0000 [IU] | Freq: Once | INTRAVENOUS | Status: AC
  Administered 2018-02-16: 10 [IU] via INTRAVENOUS
  Filled 2018-02-16: qty 0.1

## 2018-02-16 MED ORDER — ATROPINE SULFATE 0.4 MG/ML IV SOSY
PREFILLED_SYRINGE | INTRAVENOUS | Status: DC | PRN
  Administered 2018-02-16 (×2): .2 mg via INTRAVENOUS

## 2018-02-16 MED ORDER — LIDOCAINE VISCOUS HCL 2 % MT SOLN
OROMUCOSAL | Status: DC | PRN
  Administered 2018-02-16: 20 mL via OROMUCOSAL

## 2018-02-16 MED ORDER — VASOPRESSIN 20 UNIT/ML IV SOLN
INTRAVENOUS | Status: DC | PRN
  Administered 2018-02-16 (×2): 1 [IU] via INTRAVENOUS
  Administered 2018-02-16 (×4): 2 [IU] via INTRAVENOUS

## 2018-02-16 MED ORDER — DOPAMINE-DEXTROSE 3.2-5 MG/ML-% IV SOLN
0.0000 ug/kg/min | INTRAVENOUS | Status: DC
  Filled 2018-02-16: qty 250

## 2018-02-16 MED ORDER — AMIODARONE LOAD VIA INFUSION
150.0000 mg | Freq: Once | INTRAVENOUS | Status: AC
  Administered 2018-02-16: 150 mg via INTRAVENOUS
  Filled 2018-02-16: qty 83.34

## 2018-02-16 MED ORDER — APIXABAN 5 MG PO TABS
5.0000 mg | ORAL_TABLET | Freq: Two times a day (BID) | ORAL | Status: DC
  Administered 2018-02-16 – 2018-02-19 (×6): 5 mg via ORAL
  Filled 2018-02-16 (×6): qty 1

## 2018-02-16 MED ORDER — INSULIN ASPART 100 UNIT/ML ~~LOC~~ SOLN
10.0000 [IU] | Freq: Once | SUBCUTANEOUS | Status: AC
  Administered 2018-02-16: 10 [IU] via SUBCUTANEOUS

## 2018-02-16 MED ORDER — AMIODARONE HCL IN DEXTROSE 360-4.14 MG/200ML-% IV SOLN: 30.0000 mg/h | INTRAVENOUS | Status: DC

## 2018-02-16 NOTE — Progress Notes (Addendum)
Progress Note  Patient Name: [name] Date of Encounter: 02/16/2018  Primary Cardiologist: Minus Breeding, MD   Subjective   Reports feeling poorly this morning. Mostly bothered by dry mouth and mild cough. Also with intermittent nausea, no vomiting. She denies chest pain or SOB.   Inpatient Medications    Scheduled Meds: . amiodarone  150 mg Intravenous Once  . insulin aspart  0-20 Units Subcutaneous TID WC  . insulin glargine  15 Units Subcutaneous BID  . metoprolol tartrate  25 mg Oral BID  . multivitamin with minerals  1 tablet Oral BID  . pravastatin  80 mg Oral q1800   Continuous Infusions: . sodium chloride 10 mL/hr at 02/15/18 0434  . amiodarone     Followed by  . amiodarone    . diltiazem (CARDIZEM) infusion 15 mg/hr (02/16/18 0708)  . heparin 1,600 Units/hr (02/16/18 0621)   PRN Meds: acetaminophen, ondansetron (ZOFRAN) IV   Vital Signs    Vitals:   02/15/18 1644 02/15/18 2018 02/15/18 2050 02/16/18 0542  BP: 123/67  127/74 (!) 146/93  Pulse: (!) 151 (!) 154 (!) 154 (!) 145  Resp: (!) 27 19 (!) 21 (!) 23  Temp:   99 F (37.2 C) 98.2 F (36.8 C)  TempSrc:   Oral Oral  SpO2: 93% 93% 94% 93%  Weight:    219 lb 5.7 oz (99.5 kg)  Height:        Intake/Output Summary (Last 24 hours) at 02/16/2018 0817 Last data filed at 02/16/2018 0803 Gross per 24 hour  Intake 1795.29 ml  Output 3200 ml  Net -1404.71 ml   Filed Weights   02/14/18 1133 02/15/18 0504 02/16/18 0542  Weight: 208 lb (94.3 kg) 219 lb 9.3 oz (99.6 kg) 219 lb 5.7 oz (99.5 kg)    Telemetry    Atrial flutter with 2:1 conduction, rate up to 140s persistently this morning.  - Personally Reviewed  Physical Exam   GEN: No acute distress.   Neck: No JVD, no carotid bruits Cardiac: regular rhythm, tachycardic, no murmurs, rubs, or gallops.  Respiratory: Clear to auscultation bilaterally, no wheezes/ rales/ rhonchi GI: NABS, Soft, obese, nontender, non-distended  MS: No edema; No  deformity. Neuro:  Nonfocal, moving all extremities spontaneously Psych: Normal affect   Labs    Chemistry Recent Labs  Lab 02/14/18 1139 02/14/18 1158 02/14/18 1937 02/15/18 0239  NA  --  142 142 140  K  --  4.2 5.1 4.5  CL  --  112* 109 108  CO2  --   --  21* 24  GLUCOSE  --  390* 378* 324*  BUN  --  27* 27* 30*  CREATININE  --  1.20* 1.64* 1.57*  CALCIUM  --   --  8.5* 8.9  PROT 5.5*  --   --   --   ALBUMIN 3.2*  --   --   --   AST 39  --   --   --   ALT 28  --   --   --   ALKPHOS 34*  --   --   --   BILITOT 0.8  --   --   --   GFRNONAA  --   --  31* 33*  GFRAA  --   --  36* 38*  ANIONGAP  --   --  12 8     Hematology Recent Labs  Lab 02/14/18 1937 02/15/18 0239 02/16/18 0407  WBC 9.4 10.5 11.7*  RBC 4.86 4.67 4.64  HGB 13.4 12.9 12.7  HCT 42.2 40.2 40.0  MCV 86.8 86.1 86.2  MCH 27.6 27.6 27.4  MCHC 31.8 32.1 31.8  RDW 13.9 14.2 14.1  PLT 264 275 245    Cardiac Enzymes Recent Labs  Lab 02/14/18 1937 02/15/18 0239 02/15/18 0742  TROPONINI 7.11* 7.10* 4.51*    Recent Labs  Lab 02/14/18 1157  TROPIPOC 0.07     BNPNo results for input(s): BNP, PROBNP in the last 168 hours.   DDimer No results for input(s): DDIMER in the last 168 hours.   Radiology    Dg Chest Port 1 View  Result Date: 02/14/2018 CLINICAL DATA:  Chest pain with palpitations EXAM: PORTABLE CHEST 1 VIEW COMPARISON:  Jan 08, 2014 FINDINGS: There is no edema or consolidation. Heart is mildly enlarged with pulmonary vascularity normal. No adenopathy. There is aortic atherosclerosis. No evident bone lesions. No pneumothorax. IMPRESSION: No edema or consolidation. Mild cardiac enlargement. There is aortic atherosclerosis. Aortic Atherosclerosis (ICD10-I70.0). Electronically Signed   By: Lowella Grip III M.D.   On: 02/14/2018 11:54    Cardiac Studies   Echocardiogram 06/28/17: Study Conclusions Impressions:  - Normal LV size with moderate LV hypertrophy. EF 60-65%. Normal  RV size and systolic function. There was moderate to severe aortic stenosis (moderate by mean gradient, severe by calculated valve area). Visually, aortic stenosis looked severe. Mild pulmonary hypertension.  Cardiac catheterization 01/06/2005:  PRIMARY CARE PHYSICIAN: Dr. Morrie Sheldon.  REASON FOR PRESENTATION: Evaluate the patient with chest pain and a Cardiolite suggesting anterior ischemia. Chest CT scan, (coronary CT scan), suggested a 50-70% left anterior descending stenosis.  RESULTS: Hemodynamics: LV 147/20, A 147/74. Coronaries: The left main was normal. The LAD had osteal 25% stenosis. There was 25-30% stenosis after the first diagonal. The circumflex in the AV groove was normal. OM1 to OM3 were small and normal. OM4 was large, branching and normal. The right coronary artery was dominant. There were luminal irregularities. There was distal 25% stenosis after the PDA. Left ventriculogram: The left ventriculogram was obtained in the RAO projection. The ejection fraction was 65% with normal wall motion.  CONCLUSION: Nonobstructive coronary disease. PLAN: The patient will have medical management.     Patient Profile     67 y.o. female with a hx ofnonobstructive CAD(percath 2006, normalnuc 2011),CKD stage III, dyslipidemia, hypertension, DM 2,moderate AS,carotid stenosis,known smallpericardial effusion(per echo 2013l),stroke/TIA and OSAnoton CPAP --admitted fornew onset atrial flutter along with chest discomfort.  Assessment & Plan    1. New onset atrial flutter/fibrillation: presented with Afib RVR with rates in the 180s, now in atrial flutter with 2:1 conduction and rate persistently in the 140s. She was started on anticoagulation with plans for TEE/DCCV today (02/16/18) with Dr. Debara Pickett - Transitioned from PO to IV amiodarone this morning - Continue diltiazem gtt - Continue metoprolol po BID - Continue heparin gtt for  now - transition to po eliquis if no plans for further procedures.   2. CP in patient with non-obstructive CAD: presented with intermittent chest discomfort for 1 week prior to admission, likely in the setting or atrial fibrillation/flutter with RVR. Troponin peaked at 7.11. Last LHC 2006 with non-obstructive disease; NST 2011 non-ischemic. Likely elevation in troponin 2/2 demand ischemia in the setting of Afib/fluter with RVR. Aortic stenosis could also be contributing to elevated troponin.  - Would likely benefit from an ischemic evaluation after stabilization in HR/rhythm.   3. Moderate AS: noted on last echo 06/2017. Asymptomatic - Will  evaluate with TEE today  4. HTN: BP stable. Home losartan on hold to allow titration of rate controlling medications - Continue current regimen  5. HLD: Last LDL 97; reported intolerance to atorvastatin, simvastatin, and rosuvastatin . - Trialing pravastatin this admission  For questions or updates, please contact [place] Please consult [email or web address] for contact info under Cardiology/STEMI.      Signed, Abigail Butts, PA-C  02/16/2018, 8:17 AM   [phone]  I have seen and examined the patient along with Abigail Butts, PA-C.  I have reviewed the chart, notes and new data.  I agree with PA/NP's note.  Key new complaints: Complains of mild nausea and a dry mouth, but denies any issues with chest pain or dyspnea.  She has not had chest pain. Key examination changes: Remains extremely tachycardic at about 150 bpm, hard to evaluate volume status due to severe obesity. Key new findings / data: Elevated troponin is acknowledged.  Wide-complex beats on telemetry appears more consistent with aberrant conduction rather than ventricular tachycardia.  PLAN: As mentioned I suspect that she does have significant underlying CAD, but the increase in cardiac enzymes could be readily explained by extreme tachycardia on a background of pre-existing  coronary stenoses.  I think the first priority is to stop this tachycardia, which has been going on for at least 72 hours and will lead to serious complications unless addressed.  We have been unable to achieve rate control despite using 3 different agents including intravenous amiodarone. If she undergoes successful TEE cardioversion today, we will plan outpatient evaluation for coronary disease.  However I would delay invasive procedures such as cardiac catheterization angioplasty for about 30 days following cardioversion.  We will probably start with a outpatient coronary CT angiogram. We discussed the critical importance of compliance with oral anticoagulation, especially in the first 30 days following cardioversion, but reviewed the fact that this will be a lifelong requirement. Also discussed the critical need to comply with treatment for sleep apnea, if she is intolerant to CPAP and at least consider a jaw advancement device.  It is likely that untreated sleep apnea is the driver for her arrhythmia. Lipid-lowering therapy target LDL less than 70.  Sanda Klein, MD, [place] [phone] 02/16/2018, 9:09 AM

## 2018-02-16 NOTE — Progress Notes (Addendum)
[place] for Heparin (apixaban on hold) Indication: chest pain/ACS and atrial fibrillation  Allergies  Allergen Reactions  . Atorvastatin     Myalgias   . Crestor [Rosuvastatin Calcium] Other (See Comments)    Stiffness and back pain  . Ezetimibe-Simvastatin Other (See Comments)    Leg cramps  . Celebrex [Celecoxib] Rash  . Levemir [Insulin Detemir] Rash  . Penicillins Other (See Comments)    SYNCOPE (Tolerated Ancef) Has patient had a PCN reaction causing immediate rash, facial/tongue/throat swelling, SOB or lightheadedness with hypotension: Yes Has patient had a PCN reaction causing severe rash involving mucus membranes or skin necrosis: No Has patient had a PCN reaction that required hospitalization No Has patient had a PCN reaction occurring within the last 10 years: No If all of the above answers are "NO", then may proceed with Cephalosporin use.    Patient Measurements: Height: 5\' 7"  (170.2 cm) Weight: 219 lb 9.3 oz (99.6 kg) IBW/kg (Calculated) : 61.6 Heparin Dosing Weight: 82.2 kg  Vital Signs: Temp: 99 F (37.2 C) (07/04 2050) Temp Source: Oral (07/04 2050) BP: 127/74 (07/04 2050) Pulse Rate: 154 (07/04 2050)  Labs: Recent Labs    02/14/18 1139 02/14/18 1158 02/14/18 1937 02/15/18 0239 02/15/18 0742 02/15/18 1534 02/15/18 2344  HGB 12.4 12.2 13.4 12.9  --   --   --   HCT 38.9 36.0 42.2 40.2  --   --   --   PLT 207  --  264 275  --   --   --   APTT  --   --   --   --   --  38* 50*  HEPARINUNFRC  --   --   --   --   --  1.07*  --   CREATININE  --  1.20* 1.64* 1.57*  --   --   --   TROPONINI  --   --  7.11* 7.10* 4.51*  --   --     Estimated Creatinine Clearance: 42.2 mL/min (A) (by C-G formula based on SCr of 1.57 mg/dL (H)).   Medical History: Past Medical History:  Diagnosis Date  . Abnormal Doppler ultrasound of carotid artery    a. 05/2013 - mild plaque, 1-39% BICA, abnormal appearrance of thyroid (TSH  wnl in 06/2013).  Marland Kitchen CAD (coronary artery disease)    a. Nonobstructive by cath 2006 - 25% LAD, 25-30% after 1st diag, distal 25% PDA, minor irreg LCx. b. Normal nuc 2011.  . CKD (chronic kidney disease), stage III ([place])   . Cystocele   . Difficult intubation 09-07-2012   big neck trouble with intubation parotid surgery"takes little med to sedate"  . Dyslipidemia   . Esophageal stricture   . GERD (gastroesophageal reflux disease)   . Heart murmur   . Hiatal hernia    a. 07/2013: HH with small stricture holding up barium tablet (concides with patient's sx of food sticking).  . History of kidney stones   . Hyperlipidemia   . Hypertension    dr Percival Spanish  . IBS (irritable bowel syndrome)   . Memory difficulty 11/26/2015  . Morbid obesity ([place])   . Osteoarthritis    "all over" (02/14/2018)  . Osteopenia   . Osteoporosis   . Palpitations    a. PVC/PAC on EKG 08/2013.  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  Marland Kitchen Pericardial effusion    a. Mod by echo 2012 at time of  PNA. b. Echo 07/2012: small pericardial effusion vs fat.  Marland Kitchen PONV (postoperative nausea and vomiting)    n/v, and room spinning after cataract and parotid surgery  . Sleep apnea     "have mask; don't use it" (02/14/2018)  . Superficial phlebitis   . TIA (transient ischemic attack) 10/2014; 06/2016   "some memory issues since; daughter says my speech is sometimes different" (02/14/2018)  . Type II diabetes mellitus (HCC)     Medications:  Scheduled:  . amiodarone  400 mg Oral BID  . insulin aspart  0-20 Units Subcutaneous TID WC  . insulin glargine  15 Units Subcutaneous BID  . metoprolol tartrate  25 mg Oral BID  . multivitamin with minerals  1 tablet Oral BID  . pravastatin  80 mg Oral q1800    Assessment: 50 yof on apixaban PTA for atrial fibrillation. Troponin overnight have increased from 0.07>7.11>4.51. Scr trending upwards from 1.2 to 1.57 today.  Last dose of apixaban was on 7/3 at 1858.  Hgb  12.9, plt 275. No s/sx of bleeding. Given recent timing of apixaban dosing, will not bolus heparin infusion and will monitor both aPTT and heparin levels until correlate.   7/5 AM update: aPTT remains sub-therapeutic despite rate increase  Goal of Therapy:  Heparin level 0.3-0.7 units/ml aPTT 66-102 seconds Monitor platelets by anticoagulation protocol: Yes   Plan:  Inc heparin to 1600 units/hr 0900 HL/aPTT  Narda Bonds, PharmD, BCPS Clinical Pharmacist Phone: [phone]

## 2018-02-16 NOTE — Plan of Care (Signed)
  Problem: Health Behavior/Discharge Planning: Goal: Ability to manage health-related needs will improve Outcome: Progressing   

## 2018-02-16 NOTE — Progress Notes (Signed)
Inpatient Diabetes Program Recommendations  AACE/ADA: New Consensus Statement on Inpatient Glycemic Control (2015)  Target Ranges:  Prepandial:   less than 140 mg/dL      Peak postprandial:   less than 180 mg/dL (1-2 hours)      Critically ill patients:  140 - 180 mg/dL   Spoke with patient about diabetes and home regimen for diabetes control. Patient reports that she is followed by her PCP for diabetes management and last saw them 1 month ago where she obtained a Freestyle Libre Continuous Glucose Monitor for glucose checks. Patient reports that they taking insulin as prescribed. Patient did say that she tried a low carb diet and brought her glucose down too far. Patient reports feeling low at the low 100 level. Spoke with patient about needing to eat lower carbs again and work closely with her PCP to lower her insulin doses.  Cautioned patient about eliminating all carbs from diet as it is an important fuel source. Patient reports her glucose stays mostly in the 200 range. Spoke with patient about current A1c level 10.1% this admission. Patient reports 1 month ago she though it was an 8 from her PCP office check up.   Gave patient a handout to help with food choices. Spoke with patient about A1c and glucose goals and targets. Explained how hyperglycemia leads to damage within blood vessels which lead to the common complications seen with uncontrolled diabetes. Stressed to the patient the importance of improving glycemic control to prevent further complications from uncontrolled diabetes. Discussed impact of nutrition, exercise, stress, sickness, and medications on diabetes control.   Patient verbalized understanding of information discussed and has no further questions at this time related to diabetes.   Thanks,  Tama Headings RN, MSN, [facility] Inpatient Diabetes Coordinator Team Pager [phone] (8a-5p)

## 2018-02-16 NOTE — Anesthesia Preprocedure Evaluation (Addendum)
Anesthesia Evaluation  Patient identified by MRN, date of birth, ID band Patient awake    Reviewed: Allergy & Precautions, NPO status , Patient's Chart, lab work & pertinent test results  History of Anesthesia Complications (+) PONV, DIFFICULT AIRWAY and history of anesthetic complications  Airway Mallampati: IV  TM Distance: >3 FB Neck ROM: Full   Comment: Large neck Dental no notable dental hx.    Pulmonary sleep apnea ,    Pulmonary exam normal breath sounds clear to auscultation       Cardiovascular hypertension, Pt. on medications + CAD and + Past MI  Normal cardiovascular exam Rhythm:Regular Rate:Normal     Neuro/Psych TIAnegative psych ROS   GI/Hepatic Neg liver ROS, hiatal hernia, IBS (irritable bowel syndrome)   Endo/Other  diabetes, Insulin Dependent, Oral Hypoglycemic Agents  Renal/GU Renal InsufficiencyRenal disease     Musculoskeletal  (+) Arthritis , Osteoarthritis,    Abdominal (+) + obese,   Peds  Hematology HLD    Anesthesia Other Findings afib with RVR  Reproductive/Obstetrics                            Anesthesia Physical Anesthesia Plan  ASA: IV  Anesthesia Plan: General   Post-op Pain Management:    Induction: Intravenous  PONV Risk Score and Plan: 4 or greater and Propofol infusion and Treatment may vary due to age or medical condition  Airway Management Planned: Mask  Additional Equipment:   Intra-op Plan:   Post-operative Plan:   Informed Consent: I have reviewed the patients History and Physical, chart, labs and discussed the procedure including the risks, benefits and alternatives for the proposed anesthesia with the patient or authorized representative who has indicated his/her understanding and acceptance.   Dental advisory given  Plan Discussed with: CRNA  Anesthesia Plan Comments:         Anesthesia Quick Evaluation

## 2018-02-16 NOTE — Progress Notes (Signed)
She is doing much better: sinus bradycardia 60 bpm, alert, oriented. Still on 4L O2 and relatively low BP. Reviewed TEE images and previous transthoracic images.  The aortic valve changes are highly consistent with degenerative disease, with free commissures and heavy calcification and thickening at the leaflet bases. While the mitral valve leaflets are rather thick and there is some restricted motion (possibly rheumatic), there is no mitral stenosis. I do not think she qualifies as "valvular atrial fibrillation". In fact, it is likely that her dominant arrhythmia (typical counterclockwise atrial flutter) is related to untreated OSA, rather than the severe aortic stenosis. She had excellent appendage velocities in atrial flutter and is at low risk for LA thrombus formation. Will stop IV heparin and start Eliquis. If doing better tomorrow and not requiring O2 supplementation, would DC on oral amiodarone for 30 days, Eliquis and arrange valve clinic follow up. She is reluctant to consider SAVR, but more eager for TAVR.  Obviously, the extent of CAD on cardiac cath will have the biggest impact on that decision. I think it is safest to delay cath until 30 days after DCCV (although risk of thrombus formation is relatively low as described above). As outlined in Dr. Olin Pia note, if she has SAVR she can have a surgical maze procedure. Otherwise, if TAVR candidate, can consider endocardial cavotricuspid isthmus ablation.  Sanda Klein, MD, [facility] [phone] office [phone] pager

## 2018-02-16 NOTE — H&P (Signed)
   INTERVAL PROCEDURE H&P  History and Physical Interval Note:  02/16/2018 1:27 PM  Beonka P Allinson has presented today for their planned procedure. The various methods of treatment have been discussed with the patient and family. After consideration of risks, benefits and other options for treatment, the patient has consented to the procedure.  The patients' outpatient history has been reviewed, patient examined, and no change in status from most recent office note within the past 30 days. I have reviewed the patients' chart and labs and will proceed as planned. Questions were answered to the patient's satisfaction.   Pixie Casino, MD, [facility], [place] Director of the Advanced Lipid Disorders &  Cardiovascular Risk Reduction Clinic Diplomate of the American Board of Clinical Lipidology Attending Cardiologist  Direct Dial: [phone]  Fax: [phone]  Website:  [email or web address] 02/16/2018, 1:27 PM

## 2018-02-16 NOTE — Transfer of Care (Signed)
Immediate Anesthesia Transfer of Care Note  Patient: [name]  Procedure(s) Performed: CARDIOVERSION (N/A ) TRANSESOPHAGEAL ECHOCARDIOGRAM (TEE) (N/A )  Patient Location: PACU  Anesthesia Type:General  Level of Consciousness: awake, alert , oriented and patient cooperative  Airway & Oxygen Therapy: Patient Spontanous Breathing and Patient connected to face mask oxygen  Post-op Assessment: Report given to RN and Post -op Vital signs reviewed and stable  Post vital signs: Reviewed and stable  Last Vitals:  Vitals Value Taken Time  BP 108/63 02/16/2018  3:04 PM  Temp    Pulse 55 02/16/2018  3:07 PM  Resp 17 02/16/2018  3:07 PM  SpO2 93 % 02/16/2018  3:07 PM  Vitals shown include unvalidated device data.  Last Pain:  Vitals:   02/16/18 1249  TempSrc: Oral  PainSc: 0-No pain         Complications: No apparent anesthesia complications

## 2018-02-16 NOTE — Sedation Documentation (Signed)
Pt. Transferred to PACU due to post cardioversion VS. Slow heart rate, change in color. MD, CRNA, Anes. MD at bedside. Decided to go to PACU for further recover before returning to the unit. Hand off to PACU RN from myself and CRNA. Pt. Stable and improving .

## 2018-02-16 NOTE — Anesthesia Procedure Notes (Signed)
Procedure Name: MAC Date/Time: 02/16/2018 1:42 PM Performed by: Renato Shin, CRNA Pre-anesthesia Checklist: Patient identified, Emergency Drugs available, Suction available and Patient being monitored Patient Re-evaluated:Patient Re-evaluated prior to induction Oxygen Delivery Method: Nasal cannula Preoxygenation: Pre-oxygenation with 100% oxygen Induction Type: IV induction Placement Confirmation: positive ETCO2,  CO2 detector and breath sounds checked- equal and bilateral Dental Injury: Teeth and Oropharynx as per pre-operative assessment

## 2018-02-16 NOTE — Plan of Care (Signed)
  Problem: Nutrition: Goal: Adequate nutrition will be maintained Outcome: Completed/Met   Problem: Pain Managment: Goal: General experience of comfort will improve Outcome: Completed/Met Note:  Pt has not had any issues with pain    Problem: Skin Integrity: Goal: Risk for impaired skin integrity will decrease Outcome: Completed/Met Note:  Pt has no skin issues. Pt is on Oxygen & maintaining her saturations. Pt has been turning her self at least q2hrs.

## 2018-02-16 NOTE — Progress Notes (Addendum)
  Echocardiogram TEE has been performed.  Merrie Roof F 02/16/2018, 2:28 PM

## 2018-02-16 NOTE — CV Procedure (Signed)
TEE/CARDIOVERSION NOTE  TRANSESOPHAGEAL ECHOCARDIOGRAM (TEE):  Indictation: Atrial Flutter  Consent:   Informed consent was obtained prior to the procedure. The risks, benefits and alternatives for the procedure were discussed and the patient comprehended these risks.  Risks include, but are not limited to, cough, sore throat, vomiting, nausea, somnolence, esophageal and stomach trauma or perforation, bleeding, low blood pressure, aspiration, pneumonia, infection, trauma to the teeth and death.    Time Out: Verified patient identification, verified procedure, site/side was marked, verified correct patient position, special equipment/implants available, medications/allergies/relevent history reviewed, required imaging and test results available. Performed  Procedure:  After a procedural time-out, the patient was given propofol for sedation by anesthesia. The patient's heart rate, blood pressure, and oxygen saturation are monitored continuously during the procedure. The oropharynx was anesthetized 10 cc of topical 1% viscous lidocaine and 2 cetacaine sprays.  The transesophageal probe was inserted in the esophagus and stomach without difficulty and multiple views were obtained. Agitated microbubble saline contrast was not administered.  Complications:    Complications: Patient had post-procedural junctional bradycardia and hypotension, taken to PACU for further observation Patient did tolerate procedure well.  Findings:  1. LEFT VENTRICLE: The left ventricular wall thickness is severely increased.  The left ventricular cavity is small in size. Wall motion is normal.  LVEF is 60-65%.  2. RIGHT VENTRICLE:  The right ventricle is normal in structure and function without any thrombus or masses.    3. LEFT ATRIUM:  The left atrium is moderately dilated in size without any thrombus or masses.  There is not spontaneous echo contrast ("smoke") in the left atrium consistent with a low flow  state.  4. LEFT ATRIAL APPENDAGE:  The left atrial appendage is free of any thrombus or masses. The appendage has single lobes. Pulse doppler indicates low flow in the appendage.  5. ATRIAL SEPTUM:  The atrial septum appears intact and is free of thrombus and/or masses.  There is no evidence for interatrial shunting by color doppler and saline microbubble.  6. RIGHT ATRIUM:  The right atrium is dilated without any thrombus or masses.  7. MITRAL VALVE:  The mitral valve leaflets are sclerotic with annular calcification, possibly rheumatic. There is  Mild regurgitation.  There were no vegetations or stenosis.  8. AORTIC VALVE:  The aortic valve is trileaflet, but heavily calcified. The non-coronary cusp appears fixed and leaflet excursion is poor. There is severe stenosis - mean gradient 32 mmHg (but may be higher) with calculated AVA of 0.9 cm2. There is no significant regurgitation.  There were no vegetations or stenosis  9. TRICUSPID VALVE:  The tricuspid valve is normal in structure and function with Mild regurgitation.  There were no vegetations or stenosis  10.  PULMONIC VALVE:  The pulmonic valve is normal in structure and function with trivial regurgitation.  There were no vegetations or stenosis.   11. AORTIC ARCH, ASCENDING AND DESCENDING AORTA:  There was grade 1 Ron Parker et. Al, 1992) atherosclerosis of the ascending aorta, aortic arch, or proximal descending aorta.  12. PULMONARY VEINS: Anomalous pulmonary venous return was not noted.  13. PERICARDIUM: The pericardium appeared normal and non-thickened.  There is a Small pericardial effusion.  CARDIOVERSION:     Second Time Out: Verified patient identification, verified procedure, site/side was marked, verified correct patient position, special equipment/implants available, medications/allergies/relevent history reviewed, required imaging and test results available.  Performed  Procedure:  1. Patient placed on cardiac monitor,  pulse oximetry, supplemental oxygen as necessary.  2. Sedation administered per anesthesia 3. Pacer pads placed anterior and posterior chest. 4. Cardioverted 1 time(s).  5. Cardioverted at 200J biphasic.  Complications:  Complications: Post-cardioversion asystole for ~2 minutes, then hypotension and junctional bradycardia with HR's in the 40's as noted - the patient was given vasopressin, ephedrine, calcium chloride and 0.5 mg atropine. She was then transferred to PACU in stable condition for further monitoring.  Impression:  1. Severe aortic stenosis - AVA 0.9 cm2, mean gradient 32 mmHg 2. Severe LVH, small LV cavity 3. LVEF 65-70% 4. No LAA thrombus 5. Negative for PFO 6. Successful DCCV to junctional bradycardia with post-procedural hypotension  Recommendations:  1. Transfer to PACU for further hemodynamic monitoring. EP evalution given probable sick sinus syndrome - ?need for pacer. Will likely need coronary evaluation and work-up for TAVR. Recommend transthoracic echo for more accurate AV gradients.  Time Spent Directly with the Patient:  75 minutes   Pixie Casino, MD, [facility], [place] Director of the Advanced Lipid Disorders &  Cardiovascular Risk Reduction Clinic Diplomate of the American Board of Clinical Lipidology Attending Cardiologist  Direct Dial: [phone]  Fax: [phone]  Website:  [email or web address] 02/16/2018, 3:13 PM

## 2018-02-16 NOTE — Consult Note (Signed)
ELECTROPHYSIOLOGY CONSULT NOTE  Patient ID: [name], MRN: [id number], DOB/AGE: [birth year] 67 y.o. Admit date: 02/14/2018 Date of Consult: 02/16/2018  Primary Physician: Chipper Herb, MD Primary Cardiologist: [facility] [name] is a 67 y.o. female who is being seen today for the evaluation of bradycardia  at the request of MCr.   Chief Complaint: bradycardia     HPI [name] is a 67 y.o. female seen at the request of Dr. Thermon Leyland because of post cardioversion asystole and persistent bradycardia.  She has known moderate aortic stenosis, prior TIA, hypertension diabetes morbid obesity and untreated sleep apnea.  She was admitted 02/14/2018 with newly identified atrial fibrillation.  She had involved rapid atrial flutter which was difficult to rate control; she has been treated with amiodarone diltiazem and oral beta-blockers.   She has chronic shortness of breath.  She also had some chest pain at the time of her presentation.  Troponins have peaked at greater than 7.  Remote catheterization 2006 demonstrated nonobstructive disease.  Nuclear stress test 2011 was nonischemic  Echocardiogram 2018 demonstrated moderate aortic stenosis with a gradient in the mid 30s also describe significant mitral valvular calcification.  TEE today notes probably severe aortic stenosis; again mitral annular calcification was noted and per Dr. Elige Ko, likely rheumatic valvular heart disease  Thromboembolic risk factors ( age -73 , HTN-1, TIA/CVA-2, DM-1, Vasc disease -1, Gender-1) for a CHADSVASc Score of 7   Past Medical History:  Diagnosis Date  . Abnormal Doppler ultrasound of carotid artery    a. 05/2013 - mild plaque, 1-39% BICA, abnormal appearrance of thyroid (TSH wnl in 06/2013).  Marland Kitchen CAD (coronary artery disease)    a. Nonobstructive by cath 2006 - 25% LAD, 25-30% after 1st diag, distal 25% PDA, minor irreg LCx. b. Normal nuc 2011.  . CKD (chronic kidney disease), stage III ([place])   .  Cystocele   . Difficult intubation 09-07-2012   big neck trouble with intubation parotid surgery"takes little med to sedate"  . Dyslipidemia   . Esophageal stricture   . GERD (gastroesophageal reflux disease)   . Heart murmur   . Hiatal hernia    a. 07/2013: HH with small stricture holding up barium tablet (concides with patient's sx of food sticking).  . History of kidney stones   . Hyperlipidemia   . Hypertension    dr Percival Spanish  . IBS (irritable bowel syndrome)   . Memory difficulty 11/26/2015  . Morbid obesity ([place])   . Osteoarthritis    "all over" (02/14/2018)  . Osteopenia   . Osteoporosis   . Palpitations    a. PVC/PAC on EKG 08/2013.  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  Marland Kitchen Pericardial effusion    a. Mod by echo 2012 at time of PNA. b. Echo 07/2012: small pericardial effusion vs fat.  Marland Kitchen PONV (postoperative nausea and vomiting)    n/v, and room spinning after cataract and parotid surgery  . Sleep apnea     "have mask; don't use it" (02/14/2018)  . Superficial phlebitis   . TIA (transient ischemic attack) 10/2014; 06/2016   "some memory issues since; daughter says my speech is sometimes different" (02/14/2018)  . Type II diabetes mellitus ([place])       Surgical History:  Past Surgical History:  Procedure Laterality Date  . BREATH TEK H PYLORI N/A 07/29/2013   Procedure: BREATH TEK H PYLORI;  Surgeon: Shann Medal, MD;  Location: [facility];  Service: General;  Laterality: N/A;  . CARDIAC CATHETERIZATION  2006  . CARPAL TUNNEL RELEASE Left 2011  . CATARACT EXTRACTION W/ INTRAOCULAR LENS  IMPLANT, BILATERAL Bilateral 2003   left  . CHOLECYSTECTOMY OPEN  1982  . COLONOSCOPY N/A 07/14/2015   Procedure: COLONOSCOPY;  Surgeon: Ladene Artist, MD;  Location: [facility];  Service: Endoscopy;  Laterality: N/A;  . ESOPHAGOGASTRODUODENOSCOPY N/A 12/27/2013   Procedure: ESOPHAGOGASTRODUODENOSCOPY (EGD);  Surgeon: Shann Medal, MD;  Location: [facility];   Service: General;  Laterality: N/A;  . ESOPHAGOGASTRODUODENOSCOPY (EGD) WITH ESOPHAGEAL DILATION    . LAPAROSCOPIC GASTRIC SLEEVE RESECTION N/A 01/20/2014   Procedure: LAPAROSCOPIC GASTRIC SLEEVE RESECTION;  Surgeon: Shann Medal, MD;  Location: [facility];  Service: General;  Laterality: N/A;  . PAROTIDECTOMY  09/07/2012   Procedure: PAROTIDECTOMY;  Surgeon: Melida Quitter, MD;  Location: [facility];  Service: ENT;  Laterality: Left;  LEFT PAROTIDECTOMY  . TOE AMPUTATION Left 07/2017   great toe; [facility]   . TONSILLECTOMY  1968  . TUBAL LIGATION  yrs ago  . UPPER GI ENDOSCOPY  01/20/2014   Procedure: UPPER GI ENDOSCOPY;  Surgeon: Shann Medal, MD;  Location: [facility];  Service: General;;     Home Meds: Prior to Admission medications   Medication Sig Start Date End Date Taking? Authorizing Provider  aspirin 325 MG tablet Take 325 mg by mouth once.   Yes [provider]  Calcium Carbonate-Vitamin D (CALCIUM 600+D) 600-400 MG-UNIT per tablet Take 1 tablet by mouth 2 (two) times daily. 05/06/15  Yes Cherre Robins, PharmD  cholecalciferol (VITAMIN D) 1000 UNITS tablet Take 1,000 Units by mouth daily.    Yes [provider]  clopidogrel (PLAVIX) 75 MG tablet TAKE 1 TABLET BY MOUTH EVERY DAY 12/13/17  Yes Chipper Herb, MD  fenofibrate (TRICOR) 145 MG tablet TAKE 1 TABLET BY MOUTH ONCE DAILY 01/01/18  Yes Chipper Herb, MD  LIVALO 4 MG TABS TAKE 1 TABLET BY MOUTH ONCE DAILY 02/05/18  Yes Chipper Herb, MD  losartan (COZAAR) 25 MG tablet TAKE 1 TABLET BY MOUTH EVERY DAY 12/26/17  Yes Chipper Herb, MD  meclizine (ANTIVERT) 12.5 MG tablet Take 1 tablet (12.5 mg total) by mouth 3 (three) times daily as needed for dizziness. 01/30/15  Yes Chipper Herb, MD  metFORMIN (GLUCOPHAGE) 1000 MG tablet Take 1 Tablet by mouth 2 times a day 05/22/17  Yes Chipper Herb, MD  Multiple Vitamin (MULTIVITAMIN WITH MINERALS) TABS tablet Take 1 tablet by mouth 2 (two) times daily.   Yes [provider]   NOVOLIN 70/30 RELION (70-30) 100 UNIT/ML injection Inject 15-65 Units into the skin See admin instructions. Taking 65 units with breakfast and 15 units at lunch and 65 units with dinner. 07/13/16  Yes [provider]  ondansetron (ZOFRAN ODT) 4 MG disintegrating tablet Take 1 tablet (4 mg total) by mouth every 8 (eight) hours as needed for nausea. 01/03/17  Yes Larene Pickett, PA-C  Propylene Glycol (SYSTANE COMPLETE) 0.6 % SOLN Apply 1 drop to eye as needed (dry eyes).   Yes [provider]  sodium chloride (OCEAN) 0.65 % SOLN nasal spray Place 1 spray into both nostrils as needed for congestion. 11/09/14  Yes Domenic Polite, MD  vitamin E 400 UNIT capsule Take 400 Units by mouth daily.   Yes [provider]  ACCU-CHEK AVIVA PLUS test strip USE TO CHECK BLOOD SUGAR UP TO TWICE DAILY OR AS INSTRUCTED  09/04/17   Chipper Herb, MD  B-D INS SYR ULTRAFINE 1CC/31G 31G X 5/16" 1 ML MISC USE TO INJECT INSULIN TWICE A DAY AS INSTRUCTED 11/16/15   Chipper Herb, MD  Insulin Pen Needle 32G X 4 MM MISC Use to inject insulin with insulin pen 05/17/16   Chipper Herb, MD  Olmesartan-Amlodipine-HCTZ (TRIBENZOR) 40-5-12.5 MG TABS Take 1 tablet by mouth daily.    11/01/11  [provider]    Inpatient Medications:  . [MAR Hold] insulin aspart  0-20 Units Subcutaneous TID WC  . [MAR Hold] insulin glargine  15 Units Subcutaneous BID  . [MAR Hold] metoprolol tartrate  25 mg Oral BID  . [MAR Hold] multivitamin with minerals  1 tablet Oral BID  . [MAR Hold] pravastatin  80 mg Oral q1800  . [MAR Hold] sodium chloride flush  3 mL Intravenous Q12H      Allergies:  Allergies  Allergen Reactions  . Atorvastatin     Myalgias   . Crestor [Rosuvastatin Calcium] Other (See Comments)    Stiffness and back pain  . Ezetimibe-Simvastatin Other (See Comments)    Leg cramps  . Celebrex [Celecoxib] Rash  . Levemir [Insulin Detemir] Rash  . Penicillins Other (See Comments)     SYNCOPE (Tolerated Ancef) Has patient had a PCN reaction causing immediate rash, facial/tongue/throat swelling, SOB or lightheadedness with hypotension: Yes Has patient had a PCN reaction causing severe rash involving mucus membranes or skin necrosis: No Has patient had a PCN reaction that required hospitalization No Has patient had a PCN reaction occurring within the last 10 years: No If all of the above answers are "NO", then may proceed with Cephalosporin use.    Social History   Socioeconomic History  . Marital status: Married    Spouse name: Not on file  . Number of children: 3  . Years of education: some college  . Highest education level: Some college, no degree  Occupational History  . Occupation: retired    Fish farm manager: [employer]  . Financial resource strain: Not hard at all  . Food insecurity:    Worry: Never true    Inability: Never true  . Transportation needs:    Medical: No    Non-medical: No  Tobacco Use  . Smoking status: Never Smoker  . Smokeless tobacco: Never Used  Substance and Sexual Activity  . Alcohol use: Yes    Alcohol/week: 0.0 oz    Comment: 02/14/2018 "mixed drink q few years"  . Drug use: Never  . Sexual activity: Yes    Birth control/protection: Post-menopausal  Lifestyle  . Physical activity:    Days per week: 0 days    Minutes per session: 0 min  . Stress: Only a little  Relationships  . Social connections:    Talks on phone: More than three times a week    Gets together: More than three times a week    Attends religious service: More than 4 times per year    Active member of club or organization: Yes    Attends meetings of clubs or organizations: More than 4 times per year    Relationship status: Married  . Intimate partner violence:    Fear of current or ex partner: No    Emotionally abused: No    Physically abused: No    Forced sexual activity: No  Other Topics Concern  . Not on file  Social History Narrative     Married  Patient is right handed.   Patient rarely drinks caffeine.   Lives at home with husband in two story home. Husband washes laundry in the basement. They have 3 daughters and 7 grandchildren.      Family History  Problem Relation Age of Onset  . Heart failure Mother   . Hypertension Mother   . Skin cancer Mother   . Colitis Mother   . Diabetes Mother   . Kidney Stones Mother   . Stroke Mother   . Heart disease Father   . Colon polyps Father   . Diabetes Father   . Heart failure Father   . Heart attack Sister   . Diabetes Other   . Diabetes Daughter   . Stroke Maternal Grandfather   . Diabetes Maternal Grandfather   . Colon cancer Neg Hx      ROS:  Please see the history of present illness.     All other systems reviewed and negative.    Physical Exam:  Blood pressure 122/67, pulse (!) 59, temperature 97.6 F (36.4 C), resp. rate 20, height 5\' 7"  (1.702 m), weight 219 lb 5.7 oz (99.5 kg), SpO2 92 %. General: Well developed, well nourished female in no acute distress. Head: Normocephalic, atraumatic, sclera non-icteric, no xanthomas, nares are without discharge. EENT: normal Lymph Nodes:  none Back: without scoliosis/kyphosis, no CVA tendersness Neck: Negative for carotid bruits. JVD not discernible  Lungs: Clear bilaterally to auscultation with some  Wheezes,  Breathing is somewhat labored and she is wearing a mask. Heart: RRR with S1 S2.  3/6 systolic  murmur , rubs, or gallops appreciated. Abdomen: Soft, non-tender, non-distended with normoactive bowel sounds. No hepatomegaly. No rebound/guarding. No obvious abdominal masses. Msk:  Strength and tone appear normal for age. Extremities: No clubbing or cyanosis. 1+ edema.  Distal pedal pulses are 2+ and equal bilaterally. Skin: Warm and Dry Neuro: Alert and oriented X 3. CN III-XII intact Grossly normal sensory and motor function . Psych:  Responds to questions appropriately with a normal affect.       Labs: Cardiac Enzymes Recent Labs    02/14/18 1937 02/15/18 0239 02/15/18 0742  TROPONINI 7.11* 7.10* 4.51*   CBC Lab Results  Component Value Date   WBC 11.7 (H) 02/16/2018   HGB 12.7 02/16/2018   HCT 40.0 02/16/2018   MCV 86.2 02/16/2018   PLT 245 02/16/2018   PROTIME: No results for input(s): LABPROT, INR in the last 72 hours. Chemistry  Recent Labs  Lab 02/14/18 1139  02/15/18 0239  NA  --    < > 140  K  --    < > 4.5  CL  --    < > 108  CO2  --    < > 24  BUN  --    < > 30*  CREATININE  --    < > 1.57*  CALCIUM  --    < > 8.9  PROT 5.5*  --   --   BILITOT 0.8  --   --   ALKPHOS 34*  --   --   ALT 28  --   --   AST 39  --   --   GLUCOSE  --    < > 324*   < > = values in this interval not displayed.   Lipids Lab Results  Component Value Date   CHOL 194 02/15/2018   HDL 52 02/15/2018   LDLCALC 98 02/15/2018   TRIG 222 (H) 02/15/2018  BNP Pro B Natriuretic peptide (BNP)  Date/Time Value Ref Range Status  05/06/2011 03:49 PM 67.9 0 - 125 pg/mL Final   Thyroid Function Tests: Recent Labs    02/14/18 1937  TSH 0.780      Miscellaneous Lab Results  Component Value Date   DDIMER 1.02 (H) 05/07/2011    Radiology/Studies:  Dg Chest Port 1 View  Result Date: 02/14/2018 CLINICAL DATA:  Chest pain with palpitations EXAM: PORTABLE CHEST 1 VIEW COMPARISON:  Jan 08, 2014 FINDINGS: There is no edema or consolidation. Heart is mildly enlarged with pulmonary vascularity normal. No adenopathy. There is aortic atherosclerosis. No evident bone lesions. No pneumothorax. IMPRESSION: No edema or consolidation. Mild cardiac enlargement. There is aortic atherosclerosis. Aortic Atherosclerosis (ICD10-I70.0). Electronically Signed   By: Lowella Grip III M.D.   On: 02/14/2018 11:54    EKG: Personally reviewed 02/14/2018 atrial fibrillation 02/15/2018 atrial flutter 2: 1 conduction  Assessment and Plan:  Atrial fibrillation/flutter with a rapid ventricular  response  Sinus node dysfunction with post cardioversion asystole  Aortic stenosis with mitral valvular calcification?  Rheumatic valvular heart disease  Elevated troponin; remote catheterization with nonobstructive disease (2006)  Obesity  Obstructive sleep apnea-treated    The patient has sinus node dysfunction.  Following cardioversion and transient severe bradycardia her heart rates are now in the 50s and 60s.  This will likely be sufficient for the interim.  Her aortic valve disease is thought to be severe.  I spoke with Dr. Debara Pickett and is recommended a transthoracic echo to further clarify aortic valve gradient.  With her positive troponin it would be reasonable to undertake catheterization especially as a strategy for   her aortic valve stenosis may include surgery.  With her atrial arrhythmias, if she would undergo aortic valve surgical replacement, would consider a maze operation.  This would decrease the need for AV nodal blocking agents and hopefully obviate the need for pacing although I suspect given her young age she will come to pacing before long.  For now, would continue heparin.  The issue of anticoagulation will require careful evaluation of her echocardiograms as rheumatic heart disease would be a contraindication to the use of a DOAC   Will be available    Virl Axe

## 2018-02-16 NOTE — Anesthesia Postprocedure Evaluation (Signed)
Anesthesia Post Note  Patient: [name]  Procedure(s) Performed: CARDIOVERSION (N/A ) TRANSESOPHAGEAL ECHOCARDIOGRAM (TEE) (N/A )     Patient location during evaluation: PACU Anesthesia Type: General Level of consciousness: awake and alert Pain management: pain level controlled Vital Signs Assessment: post-procedure vital signs reviewed and stable Respiratory status: spontaneous breathing, nonlabored ventilation, respiratory function stable and patient connected to nasal cannula oxygen Cardiovascular status: blood pressure returned to baseline and stable Postop Assessment: no apparent nausea or vomiting Anesthetic complications: no    Last Vitals:  Vitals:   02/16/18 1519 02/16/18 1534  BP: 113/66   Pulse: (!) 57   Resp: 20   Temp:    SpO2: 93% 92%    Last Pain:  Vitals:   02/16/18 1534  TempSrc:   PainSc: 0-No pain                 Brina Umeda,W. EDMOND

## 2018-02-16 NOTE — Progress Notes (Addendum)
[place] for Heparin (apixaban on hold) Indication: chest pain/ACS and atrial fibrillation  Allergies  Allergen Reactions  . Atorvastatin     Myalgias   . Crestor [Rosuvastatin Calcium] Other (See Comments)    Stiffness and back pain  . Ezetimibe-Simvastatin Other (See Comments)    Leg cramps  . Celebrex [Celecoxib] Rash  . Levemir [Insulin Detemir] Rash  . Penicillins Other (See Comments)    SYNCOPE (Tolerated Ancef) Has patient had a PCN reaction causing immediate rash, facial/tongue/throat swelling, SOB or lightheadedness with hypotension: Yes Has patient had a PCN reaction causing severe rash involving mucus membranes or skin necrosis: No Has patient had a PCN reaction that required hospitalization No Has patient had a PCN reaction occurring within the last 10 years: No If all of the above answers are "NO", then may proceed with Cephalosporin use.    Patient Measurements: Height: 5\' 7"  (170.2 cm) Weight: 219 lb 5.7 oz (99.5 kg) IBW/kg (Calculated) : 61.6 Heparin Dosing Weight: 82.2 kg  Vital Signs: Temp: 97.7 F (36.5 C) (07/05 1534) Temp Source: Oral (07/05 1249) BP: 122/67 (07/05 1534) Pulse Rate: 59 (07/05 1534)  Labs: Recent Labs    02/14/18 1158 02/14/18 1937 02/15/18 0239 02/15/18 0742  02/15/18 1534 02/15/18 2344 02/16/18 0407 02/16/18 0921 02/16/18 1656  HGB 12.2 13.4 12.9  --   --   --   --  12.7  --   --   HCT 36.0 42.2 40.2  --   --   --   --  40.0  --   --   PLT  --  264 275  --   --   --   --  245  --   --   APTT  --   --   --   --    < > 38* 50*  --  73* 79*  HEPARINUNFRC  --   --   --   --   --  1.07*  --   --  1.12*  --   CREATININE 1.20* 1.64* 1.57*  --   --   --   --   --   --   --   TROPONINI  --  7.11* 7.10* 4.51*  --   --   --   --   --   --    < > = values in this interval not displayed.    Estimated Creatinine Clearance: 42.2 mL/min (A) (by C-G formula based on SCr of 1.57 mg/dL  (H)).   Medical History: Past Medical History:  Diagnosis Date  . Abnormal Doppler ultrasound of carotid artery    a. 05/2013 - mild plaque, 1-39% BICA, abnormal appearrance of thyroid (TSH wnl in 06/2013).  Marland Kitchen CAD (coronary artery disease)    a. Nonobstructive by cath 2006 - 25% LAD, 25-30% after 1st diag, distal 25% PDA, minor irreg LCx. b. Normal nuc 2011.  . CKD (chronic kidney disease), stage III ([place])   . Cystocele   . Difficult intubation 09-07-2012   big neck trouble with intubation parotid surgery"takes little med to sedate"  . Dyslipidemia   . Esophageal stricture   . GERD (gastroesophageal reflux disease)   . Heart murmur   . Hiatal hernia    a. 07/2013: HH with small stricture holding up barium tablet (concides with patient's sx of food sticking).  . History of kidney stones   . Hyperlipidemia   . Hypertension    dr Percival Spanish  .  IBS (irritable bowel syndrome)   . Memory difficulty 11/26/2015  . Morbid obesity ([place])   . Osteoarthritis    "all over" (02/14/2018)  . Osteopenia   . Osteoporosis   . Palpitations    a. PVC/PAC on EKG 08/2013.  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  Marland Kitchen Pericardial effusion    a. Mod by echo 2012 at time of PNA. b. Echo 07/2012: small pericardial effusion vs fat.  Marland Kitchen PONV (postoperative nausea and vomiting)    n/v, and room spinning after cataract and parotid surgery  . Sleep apnea     "have mask; don't use it" (02/14/2018)  . Superficial phlebitis   . TIA (transient ischemic attack) 10/2014; 06/2016   "some memory issues since; daughter says my speech is sometimes different" (02/14/2018)  . Type II diabetes mellitus ([facility])     Medications:  Scheduled:  . insulin aspart  0-20 Units Subcutaneous TID WC  . insulin glargine  15 Units Subcutaneous BID  . metoprolol tartrate  25 mg Oral BID  . multivitamin with minerals  1 tablet Oral BID  . pravastatin  80 mg Oral q1800  . sodium chloride flush  3 mL Intravenous Q12H     Assessment: 37 yof on apixaban PTA for atrial fibrillation. She in on heparin for increasing troponin and for possible cardiac cath. -aPTT confirmed at goal  Goal of Therapy:  Heparin level 0.3-0.7 units/ml aPTT 66-102 seconds Monitor platelets by anticoagulation protocol: Yes   Plan:  Continue heparin infusion at1600 units/hr Monitor daily HL, aPTT, CBC  Hildred Laser, PharmD Clinical Pharmacist Please check Amion for pharmacy contact number  Addendum -heparin discontinued and plans to restart apixaban  Plan -restart apixaban 5mg  po bid  Hildred Laser, PharmD Clinical Pharmacist Please check Amion for pharmacy contact number

## 2018-02-16 NOTE — Progress Notes (Signed)
[place] for Heparin (apixaban on hold) Indication: chest pain/ACS and atrial fibrillation  Allergies  Allergen Reactions  . Atorvastatin     Myalgias   . Crestor [Rosuvastatin Calcium] Other (See Comments)    Stiffness and back pain  . Ezetimibe-Simvastatin Other (See Comments)    Leg cramps  . Celebrex [Celecoxib] Rash  . Levemir [Insulin Detemir] Rash  . Penicillins Other (See Comments)    SYNCOPE (Tolerated Ancef) Has patient had a PCN reaction causing immediate rash, facial/tongue/throat swelling, SOB or lightheadedness with hypotension: Yes Has patient had a PCN reaction causing severe rash involving mucus membranes or skin necrosis: No Has patient had a PCN reaction that required hospitalization No Has patient had a PCN reaction occurring within the last 10 years: No If all of the above answers are "NO", then may proceed with Cephalosporin use.    Patient Measurements: Height: 5\' 7"  (170.2 cm) Weight: 219 lb 5.7 oz (99.5 kg) IBW/kg (Calculated) : 61.6 Heparin Dosing Weight: 82.2 kg  Vital Signs: Temp: 98.2 F (36.8 C) (07/05 0542) Temp Source: Oral (07/05 0542) BP: 146/93 (07/05 0542) Pulse Rate: 146 (07/05 1009)  Labs: Recent Labs    02/14/18 1158 02/14/18 1937 02/15/18 0239 02/15/18 0742 02/15/18 1534 02/15/18 2344 02/16/18 0407 02/16/18 0921  HGB 12.2 13.4 12.9  --   --   --  12.7  --   HCT 36.0 42.2 40.2  --   --   --  40.0  --   PLT  --  264 275  --   --   --  245  --   APTT  --   --   --   --  38* 50*  --  73*  HEPARINUNFRC  --   --   --   --  1.07*  --   --  1.12*  CREATININE 1.20* 1.64* 1.57*  --   --   --   --   --   TROPONINI  --  7.11* 7.10* 4.51*  --   --   --   --     Estimated Creatinine Clearance: 42.2 mL/min (A) (by C-G formula based on SCr of 1.57 mg/dL (H)).   Medical History: Past Medical History:  Diagnosis Date  . Abnormal Doppler ultrasound of carotid artery    a. 05/2013 - mild  plaque, 1-39% BICA, abnormal appearrance of thyroid (TSH wnl in 06/2013).  Marland Kitchen CAD (coronary artery disease)    a. Nonobstructive by cath 2006 - 25% LAD, 25-30% after 1st diag, distal 25% PDA, minor irreg LCx. b. Normal nuc 2011.  . CKD (chronic kidney disease), stage III ([place])   . Cystocele   . Difficult intubation 09-07-2012   big neck trouble with intubation parotid surgery"takes little med to sedate"  . Dyslipidemia   . Esophageal stricture   . GERD (gastroesophageal reflux disease)   . Heart murmur   . Hiatal hernia    a. 07/2013: HH with small stricture holding up barium tablet (concides with patient's sx of food sticking).  . History of kidney stones   . Hyperlipidemia   . Hypertension    dr Percival Spanish  . IBS (irritable bowel syndrome)   . Memory difficulty 11/26/2015  . Morbid obesity ([place])   . Osteoarthritis    "all over" (02/14/2018)  . Osteopenia   . Osteoporosis   . Palpitations    a. PVC/PAC on EKG 08/2013.  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  Mar[place] Kitchen Pericardial effusion    a. Mod by echo 2012 at time of PNA. b. Echo 07/2012: small pericardial effusion vs fat.  Marland Kitchen PONV (postoperative nausea and vomiting)    n/v, and room spinning after cataract and parotid surgery  . Sleep apnea     "have mask; don't use it" (02/14/2018)  . Superficial phlebitis   . TIA (transient ischemic attack) 10/2014; 06/2016   "some memory issues since; daughter says my speech is sometimes different" (02/14/2018)  . Type II diabetes mellitus (HCC)     Medications:  Scheduled:  . insulin aspart  0-20 Units Subcutaneous TID WC  . insulin glargine  15 Units Subcutaneous BID  . metoprolol tartrate  25 mg Oral BID  . multivitamin with minerals  1 tablet Oral BID  . pravastatin  80 mg Oral q1800  . sodium chloride flush  3 mL Intravenous Q12H    Assessment: 20 yof on apixaban PTA for atrial fibrillation. Troponin overnight have increased from 0.07>7.11>4.51. Scr trending upwards  from 1.2 to 1.57 on 7/4. Last dose of apixaban was on 7/3 at 1858.   APTT was therapeutic at 73, heparin level remains elevated at 1.12. Hgb 12.7, plt 245. No s/sx of bleeding. No infusion issues. Given recent timing of apixaban dosing, will continue to monitor both aPTT and heparin levels until correlate.   Goal of Therapy:  Heparin level 0.3-0.7 units/ml aPTT 66-102 seconds Monitor platelets by anticoagulation protocol: Yes   Plan:  Continue heparin infusion at1600 units/hr Obtain aPTT in 6 hours as confirmatory level Monitor daily HL, aPTT, CBC, and for s/sx of bleeding  Doylene Canard, PharmD Clinical Pharmacist  Pager: [phone] Phone: [phone]

## 2018-02-17 DIAGNOSIS — G459 Transient cerebral ischemic attack, unspecified: Secondary | ICD-10-CM

## 2018-02-17 LAB — CBC
HCT: 40.9 % (ref 36.0–46.0)
Hemoglobin: 13.1 g/dL (ref 12.0–15.0)
MCH: 27.7 pg (ref 26.0–34.0)
MCHC: 32 g/dL (ref 30.0–36.0)
MCV: 86.5 fL (ref 78.0–100.0)
Platelets: 244 10*3/uL (ref 150–400)
RBC: 4.73 MIL/uL (ref 3.87–5.11)
RDW: 14.3 % (ref 11.5–15.5)
WBC: 12.5 10*3/uL — ABNORMAL HIGH (ref 4.0–10.5)

## 2018-02-17 LAB — GLUCOSE, CAPILLARY
Glucose-Capillary: 229 mg/dL — ABNORMAL HIGH (ref 70–99)
Glucose-Capillary: 346 mg/dL — ABNORMAL HIGH (ref 70–99)
Glucose-Capillary: 319 mg/dL — ABNORMAL HIGH (ref 70–99)
Glucose-Capillary: 438 mg/dL — ABNORMAL HIGH (ref 70–99)

## 2018-02-17 MED ORDER — AMIODARONE HCL 200 MG PO TABS
200.0000 mg | ORAL_TABLET | Freq: Every day | ORAL | Status: DC
  Administered 2018-02-17 – 2018-02-19 (×3): 200 mg via ORAL
  Filled 2018-02-17 (×3): qty 1

## 2018-02-17 NOTE — Progress Notes (Signed)
Progress Note  Patient Name: [name] Date of Encounter: 02/17/2018  Primary Cardiologist: Minus Breeding, MD   Subjective   Feels better today, on 2 L oxygen via nasal cannula, felt dizzy when she got out of bed this morning.  Inpatient Medications    Scheduled Meds: . amiodarone  200 mg Oral Daily  . apixaban  5 mg Oral BID  . insulin aspart  0-20 Units Subcutaneous TID WC  . insulin glargine  15 Units Subcutaneous BID  . metoprolol tartrate  25 mg Oral BID  . multivitamin with minerals  1 tablet Oral BID  . pravastatin  80 mg Oral q1800  . sodium chloride flush  3 mL Intravenous Q12H   Continuous Infusions: . sodium chloride 10 mL/hr at 02/15/18 0434  . sodium chloride     PRN Meds: acetaminophen, ondansetron (ZOFRAN) IV, sodium chloride flush   Vital Signs    Vitals:   02/16/18 1534 02/16/18 2120 02/17/18 0552 02/17/18 1017  BP: 122/67 131/67 138/62 (!) 151/66  Pulse: (!) 59 (!) 57 79 87  Resp: 20 (!) 22 19   Temp: 97.7 F (36.5 C) 97.7 F (36.5 C) 98.6 F (37 C)   TempSrc:  Oral Oral   SpO2: 92% 92% 93%   Weight:   220 lb 0.3 oz (99.8 kg)   Height:        Intake/Output Summary (Last 24 hours) at 02/17/2018 1106 Last data filed at 02/17/2018 0707 Gross per 24 hour  Intake -  Output 300 ml  Net -300 ml   Filed Weights   02/15/18 0504 02/16/18 0542 02/17/18 0552  Weight: 219 lb 9.3 oz (99.6 kg) 219 lb 5.7 oz (99.5 kg) 220 lb 0.3 oz (99.8 kg)    Telemetry    SR - Personally Reviewed  ECG    SR - Personally Reviewed  Physical Exam  On 2 L O2 via [place] GEN: No acute distress.   Neck: No JVD Cardiac: RRR, I have out of 6 systolic murmur no S2, rubs, or gallops.  Respiratory: rales at bases bilaterally. GI: Soft, nontender, non-distended  MS: No edema; No deformity. Neuro:  Nonfocal  Psych: Normal affect   Labs    Chemistry Recent Labs  Lab 02/14/18 1139 02/14/18 1158 02/14/18 1937 02/15/18 0239  NA  --  142 142 140  K  --  4.2  5.1 4.5  CL  --  112* 109 108  CO2  --   --  21* 24  GLUCOSE  --  390* 378* 324*  BUN  --  27* 27* 30*  CREATININE  --  1.20* 1.64* 1.57*  CALCIUM  --   --  8.5* 8.9  PROT 5.5*  --   --   --   ALBUMIN 3.2*  --   --   --   AST 39  --   --   --   ALT 28  --   --   --   ALKPHOS 34*  --   --   --   BILITOT 0.8  --   --   --   GFRNONAA  --   --  31* 33*  GFRAA  --   --  36* 38*  ANIONGAP  --   --  12 8     Hematology Recent Labs  Lab 02/15/18 0239 02/16/18 0407 02/17/18 0431  WBC 10.5 11.7* 12.5*  RBC 4.67 4.64 4.73  HGB 12.9 12.7 13.1  HCT 40.2 40.0  40.9  MCV 86.1 86.2 86.5  MCH 27.6 27.4 27.7  MCHC 32.1 31.8 32.0  RDW 14.2 14.1 14.3  PLT 275 245 244    Cardiac Enzymes Recent Labs  Lab 02/14/18 1937 02/15/18 0239 02/15/18 0742  TROPONINI 7.11* 7.10* 4.51*    Recent Labs  Lab 02/14/18 1157  TROPIPOC 0.07     BNPNo results for input(s): BNP, PROBNP in the last 168 hours.   DDimer No results for input(s): DDIMER in the last 168 hours.   Radiology    No results found.  Cardiac Studies     Patient Profile     67 y.o. female   [name]    1. New onset atrial flutter/fibrillation: presented with Afib RVR with rates in the 180s,  - s/p successful DCCV yesterday -Amiodarone and Cardizem drip -Start p.o. amiodarone 200 mg daily for another 30 days as per Dr. Sallyanne Kuster -On p.o. metoprolol 25 mg twice daily only with heart rates in 50s and 60s -Transition to Eliquis yesterday - it is likely that her dominant arrhythmia (typical counterclockwise atrial flutter) is related to untreated OSA, rather than the severe aortic stenosis.  2. CP in patient with non-obstructive CAD: presented with intermittent chest discomfort for 1 week prior to admission, likely in the setting or atrial fibrillation/flutter with RVR. Troponin peaked at 7.11. Last LHC 2006 with non-obstructive disease; NST 2011 non-ischemic. Likely elevation in troponin 2/2 demand ischemia in  the setting of Afib/fluter with RVR. Aortic stenosis could also be contributing to elevated troponin.  - Would likely benefit from an ischemic evaluation after stabilization in HR/rhythm.   3. Moderate to severe AS: noted on last echo 06/2017. Asymptomatic - The aortic valve changes are highly consistent with degenerative disease, with free commissures and heavy calcification and thickening at the leaflet bases. While the mitral valve leaflets are rather thick and there is some restricted motion (possibly rheumatic), there is no mitral stenosis. - arrange valve clinic follow up. She is reluctant to consider SAVR, but more eager for TAVR.  -Obviously, the extent of CAD on cardiac cath will have the biggest impact on that decision. I think it is safest to delay cath until 30 days after DCCV (although risk of thrombus formation is relatively low as described above).  Also her creatinine currently 1.5 from baseline 1.2. As outlined in Dr. Olin Pia note, if she has SAVR she can have a surgical maze procedure. Otherwise, if TAVR candidate, can consider endocardial cavotricuspid isthmus ablation.  4. HTN: BP stable. Home losartan on hold to allow titration of rate controlling medications - Continue current regimen  5. HLD: Last LDL 97; reported intolerance to atorvastatin, simvastatin, and rosuvastatin . - Trialing pravastatin this admission  We will start mobilizing starting with physical therapy today, I have decreased her oxygen to 1 L, if she is dependent on it which she will need home O2 evaluation prior to discharge.  For questions or updates, please contact [place] Please consult [email or web address] for contact info under Cardiology/STEMI.      Signed, Ena Dawley, MD  02/17/2018, 11:06 AM

## 2018-02-17 NOTE — Plan of Care (Signed)
  Problem: Clinical Measurements: Goal: Ability to maintain clinical measurements within normal limits will improve Outcome: Progressing   

## 2018-02-17 NOTE — Progress Notes (Signed)
Pt desatting on Room Air to 82%. Pt in no apparent distress. [place] reapplied and sats improved to low 90s on 3l [place]. Will continue to monitor

## 2018-02-18 ENCOUNTER — Inpatient Hospital Stay (HOSPITAL_COMMUNITY): Payer: Medicare Other

## 2018-02-18 LAB — BASIC METABOLIC PANEL
Anion gap: 9 (ref 5–15)
BUN: 39 mg/dL — ABNORMAL HIGH (ref 8–23)
CO2: 28 mmol/L (ref 22–32)
Calcium: 9.2 mg/dL (ref 8.9–10.3)
Chloride: 102 mmol/L (ref 98–111)
Creatinine, Ser: 1.67 mg/dL — ABNORMAL HIGH (ref 0.44–1.00)
GFR calc Af Amer: 36 mL/min — ABNORMAL LOW (ref >60)
GFR calc non Af Amer: 31 mL/min — ABNORMAL LOW (ref >60)
Glucose, Bld: 301 mg/dL — ABNORMAL HIGH (ref 70–99)
Potassium: 4.3 mmol/L (ref 3.5–5.1)
Sodium: 139 mmol/L (ref 135–145)

## 2018-02-18 LAB — GLUCOSE, CAPILLARY
Glucose-Capillary: 281 mg/dL — ABNORMAL HIGH (ref 70–99)
Glucose-Capillary: 299 mg/dL — ABNORMAL HIGH (ref 70–99)
Glucose-Capillary: 245 mg/dL — ABNORMAL HIGH (ref 70–99)
Glucose-Capillary: 404 mg/dL — ABNORMAL HIGH (ref 70–99)

## 2018-02-18 LAB — MAGNESIUM: Magnesium: 1.8 mg/dL (ref 1.7–2.4)

## 2018-02-18 MED ORDER — FUROSEMIDE 10 MG/ML IJ SOLN
40.0000 mg | Freq: Once | INTRAMUSCULAR | Status: AC
  Administered 2018-02-18: 40 mg via INTRAVENOUS
  Filled 2018-02-18: qty 4

## 2018-02-18 MED ORDER — INSULIN ASPART 100 UNIT/ML ~~LOC~~ SOLN
5.0000 [IU] | Freq: Once | SUBCUTANEOUS | Status: AC
  Administered 2018-02-18: 5 [IU] via SUBCUTANEOUS

## 2018-02-18 NOTE — Progress Notes (Signed)
Physical Therapy  Note  SATURATION QUALIFICATIONS: (This note is used to comply with regulatory documentation for home oxygen)  Patient Saturations on Room Air at Rest = 92%  Patient Saturations on Room Air while Ambulating = 87%  Patient Saturations on 2 Liters of oxygen while Ambulating = 90%  Please briefly explain why patient needs home oxygen: Patient requires supplemental oxygen to maintain oxygen saturations at acceptable, safe levels with physical activity.  Full PT eval to follow;   Roney Marion, [place] Pager [phone] Office [phone]

## 2018-02-18 NOTE — Evaluation (Signed)
Physical Therapy Evaluation Patient Details Name: [name] MRN: [id number] DOB: April 14, 1951 Today's Date: 02/18/2018   History of Present Illness  67 y.o. female with a hx of nonobstructive CAD (per cath 2006, normal nuc 2011), CKD stage III, dyslipidemia, hypertension, DM 2, moderate AS, carotid stenosis, known small pericardial effusion (per echo 2013l), stroke/TIA and OSA not on CPAP --admitted for new onset atrial flutter along with chest discomfort. Underwent successful DCCV; difficulty weaning supplemental O2  Clinical Impression   Pt admitted with above diagnosis. Pt currently with functional limitations due to the deficits listed below (see PT Problem List). Presents with decr functional capacity; Still, walking the halls well, fatigued at the end of walk and needing supplemental O2 to keep sats above 87% with activity;  Pt will benefit from skilled PT to increase their independence and safety with mobility to allow discharge to the venue listed below.       Follow Up Recommendations Other (comment)(Outpatient Cardiac/Pulmonary Rehab)    Equipment Recommendations  None recommended by PT    Recommendations for Other Services       Precautions / Restrictions Precautions Precautions: Other (comment) Precaution Comments: watch O2 sats on Room Air      Mobility  Bed Mobility                  Transfers Overall transfer level: Needs assistance Equipment used: None Transfers: Sit to/from Stand Sit to Stand: Min guard         General transfer comment: minguard for safety with initial stand; managed well  Ambulation/Gait Ambulation/Gait assistance: Min guard Gait Distance (Feet): 150 Feet Assistive device: None Gait Pattern/deviations: Step-through pattern Gait velocity: slightly slowed   General Gait Details: Cues to self-monitor for activity tolerance; Fatigued at end of walk  Financial trader [place] (Stroke  Patients Only)       Balance Overall balance assessment: Mild deficits observed, not formally tested                                           Pertinent Vitals/Pain Pain Assessment: No/denies pain    Home Living Family/patient expects to be discharged to:: Private residence Living Arrangements: Spouse/significant other Available Help at Discharge: Family;Available 24 hours/day Type of Home: House Home Access: Stairs to enter   [employer] of Steps: 3 Home Layout: Two level;Able to live on main level with bedroom/bathroom;Laundry or work area in basement(husband goes downstairs for [employer]) Home Equipment: Environmental consultant - 2 wheels;Bedside commode      Prior Function Level of Independence: Independent         Comments: 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 year; was using RW postop, but hasn't needed RW in quite some time     Hand Dominance        Extremity/Trunk Assessment   Upper Extremity Assessment Upper Extremity Assessment: Overall WFL for tasks assessed(for simple activities)    Lower Extremity Assessment Lower Extremity Assessment: Generalized weakness       Communication   Communication: No difficulties  Cognition Arousal/Alertness: Awake/alert Behavior During Therapy: WFL for tasks assessed/performed Overall Cognitive Status: Within Functional Limits for tasks assessed  General Comments General comments (skin integrity, edema, etc.): See also other PT note of this date for O2 qualifying walk    Exercises     Assessment/Plan    PT Assessment Patient needs continued PT services  PT Problem List Decreased strength;Decreased activity tolerance;Decreased mobility;Decreased knowledge of use of DME;Decreased knowledge of precautions;Cardiopulmonary status limiting activity       PT Treatment Interventions DME instruction;Gait training;Stair training;Functional mobility  training;Therapeutic activities;Therapeutic exercise;Balance training;Patient/family education    PT Goals (Current goals can be found in the Care Plan section)  Acute Rehab PT Goals Patient Stated Goal: hopes to be home soon PT Goal Formulation: With patient Time For Goal Achievement: 03/04/18 Potential to Achieve Goals: Good    Frequency Min 3X/week   Barriers to discharge        Co-evaluation               AM-PAC PT "6 Clicks" Daily Activity  Outcome Measure Difficulty turning over in bed (including adjusting bedclothes, sheets and blankets)?: None Difficulty moving from lying on back to sitting on the side of the bed? : A Little Difficulty sitting down on and standing up from a chair with arms (e.g., wheelchair, bedside commode, etc,.)?: A Little Help needed moving to and from a bed to chair (including a wheelchair)?: None Help needed walking in hospital room?: None Help needed climbing 3-5 steps with a railing? : A Little 6 Click Score: 21    End of Session Equipment Utilized During Treatment: Gait belt;Oxygen Activity Tolerance: Patient tolerated treatment well Patient left: in bed;with call bell/phone within reach(sitting EOB in anticipation of lunch) Nurse Communication: Mobility status PT Visit Diagnosis: Other abnormalities of gait and mobility (R26.89);Other (comment)(Decr functional capacity)    Time: 1136-1200 PT Time Calculation (min) (ACUTE ONLY): 24 min   Charges:   PT Evaluation $PT Eval Low Complexity: 1 Low PT Treatments $Gait Training: 8-22 mins   PT G Codes:        Roney Marion, PT  Acute Rehabilitation Services Pager [phone] Office [phone]   [place] 02/18/2018, 2:18 PM

## 2018-02-18 NOTE — Progress Notes (Signed)
Progress Note  Patient Name: [name] Date of Encounter: 02/18/2018  Primary Cardiologist: Minus Breeding, MD   Subjective   Complains of a "wheeze" and nonproductive cough. Unable to DC oxygen. Has not been ambulating.  Inpatient Medications    Scheduled Meds: . amiodarone  200 mg Oral Daily  . apixaban  5 mg Oral BID  . insulin aspart  0-20 Units Subcutaneous TID WC  . insulin glargine  15 Units Subcutaneous BID  . metoprolol tartrate  25 mg Oral BID  . multivitamin with minerals  1 tablet Oral BID  . pravastatin  80 mg Oral q1800  . sodium chloride flush  3 mL Intravenous Q12H   Continuous Infusions: . sodium chloride 10 mL/hr at 02/15/18 0434  . sodium chloride     PRN Meds: acetaminophen, ondansetron (ZOFRAN) IV, sodium chloride flush   Vital Signs    Vitals:   02/17/18 1759 02/17/18 1802 02/17/18 2107 02/18/18 0456  BP:   (!) 130/55 (!) 159/66  Pulse: 89 90 93 88  Resp: 17 (!) 23 (!) 26 16  Temp:   97.8 F (36.6 C) 98.1 F (36.7 C)  TempSrc:   Oral Oral  SpO2: (!) 85% 94% 94% 94%  Weight:    214 lb 4.8 oz (97.2 kg)  Height:        Intake/Output Summary (Last 24 hours) at 02/18/2018 1113 Last data filed at 02/18/2018 0945 Gross per 24 hour  Intake 723 ml  Output 1100 ml  Net -377 ml   Filed Weights   02/16/18 0542 02/17/18 0552 02/18/18 0456  Weight: 219 lb 5.7 oz (99.5 kg) 220 lb 0.3 oz (99.8 kg) 214 lb 4.8 oz (97.2 kg)    Telemetry    SR - Personally Reviewed  ECG    SR - Personally Reviewed  Physical Exam  On 2 L O2 via [place] GEN: No acute distress.   Neck: No JVD Cardiac: RRR, 8-4/6 systolic murmur no S2, rubs, or gallops.  Respiratory: rales at bases bilaterally. GI: Soft, nontender, non-distended  MS: No edema; No deformity. Neuro:  Nonfocal  Psych: Normal affect   Labs    Chemistry Recent Labs  Lab 02/14/18 1139 02/14/18 1158 02/14/18 1937 02/15/18 0239  NA  --  142 142 140  K  --  4.2 5.1 4.5  CL  --  112* 109 108   CO2  --   --  21* 24  GLUCOSE  --  390* 378* 324*  BUN  --  27* 27* 30*  CREATININE  --  1.20* 1.64* 1.57*  CALCIUM  --   --  8.5* 8.9  PROT 5.5*  --   --   --   ALBUMIN 3.2*  --   --   --   AST 39  --   --   --   ALT 28  --   --   --   ALKPHOS 34*  --   --   --   BILITOT 0.8  --   --   --   GFRNONAA  --   --  31* 33*  GFRAA  --   --  36* 38*  ANIONGAP  --   --  12 8     Hematology Recent Labs  Lab 02/15/18 0239 02/16/18 0407 02/17/18 0431  WBC 10.5 11.7* 12.5*  RBC 4.67 4.64 4.73  HGB 12.9 12.7 13.1  HCT 40.2 40.0 40.9  MCV 86.1 86.2 86.5  MCH 27.6 27.4  27.7  MCHC 32.1 31.8 32.0  RDW 14.2 14.1 14.3  PLT 275 245 244    Cardiac Enzymes Recent Labs  Lab 02/14/18 1937 02/15/18 0239 02/15/18 0742  TROPONINI 7.11* 7.10* 4.51*    Recent Labs  Lab 02/14/18 1157  TROPIPOC 0.07     BNPNo results for input(s): BNP, PROBNP in the last 168 hours.   DDimer No results for input(s): DDIMER in the last 168 hours.   Radiology    No results found.  Cardiac Studies   TEE on 02/16/18:Study Conclusions  - Left ventricle: Severe LVH. Small LV cavity. Systolic function   was vigorous. The estimated ejection fraction was in the range of   65% to 70%. Wall motion was normal; there were no regional wall   motion abnormalities. - Aortic valve: Calcified with restricted leaflet motion,   especially the non-coronary cusp which appears immobile. Severe   stenosis. Mean gradient (S): 32 mm Hg. Peak gradient (S): 57 mm   Hg. Valve area (VTI): 0.9 cm^2. Valve area (Vmax): 0.92 cm^2.   Valve area (Vmean): 0.7 cm^2. - Mitral valve: Mildly calcified annulus. Sclerotic leafelts,   possibly rheumatic. Mild regurgitation. - Left atrium: The atrium was dilated. No evidence of thrombus in   the atrial cavity or appendage. - Right atrium: The atrium was dilated. - Atrial septum: No defect or patent foramen ovale was identified. - Tricuspid valve: There was mild regurgitation. -  Pericardium, extracardiac: Small pericardial effusion.  Impressions:  - LVEF 65-70%, severe LVH, severe aortic stenosis - mean gradient   of 32 mmHg - AVA 0.9 cm2, small pericardial effusion. Successful   DCCV to junctional rhythm.   Patient Profile     67 y.o. female   [place]    1. New onset atrial flutter/fibrillation: presented with Afib RVR with rates in the 180s,  - s/p successful DCCV  -Amiodarone and metoprolol orally -Start p.o. amiodarone 200 mg daily for another 30 days as per Dr. Sallyanne Kuster -On p.o. metoprolol 25 mg twice daily only with heart rates in 50s and 60s -On Eliquis for anticoagulation - it is likely that her dominant arrhythmia (typical counterclockwise atrial flutter) is related to untreated OSA, rather than the severe aortic stenosis.  2. CP in patient with non-obstructive CAD: presented with intermittent chest discomfort for 1 week prior to admission, likely in the setting or atrial fibrillation/flutter with RVR. Troponin peaked at 7.11. Last LHC 2006 with non-obstructive disease; NST 2011 non-ischemic. Likely elevation in troponin 2/2 demand ischemia in the setting of Afib/fluter with RVR. Aortic stenosis could also be contributing to elevated troponin.  - Plan [facility] for evaluation of possible TAVR. Plan to wait until 30 days after DCCV given need for uninterrupted anticoagulation.   3. Moderate to severe AS: noted on last echo 06/2017. Asymptomatic - The aortic valve changes are highly consistent with degenerative disease, with free commissures and heavy calcification and thickening at the leaflet bases. While the mitral valve leaflets are rather thick and there is some restricted motion (possibly rheumatic), there is no mitral stenosis. - arrange valve clinic follow up. She is reluctant to consider SAVR, but more eager for TAVR.  -Obviously, the extent of CAD on cardiac cath will have the biggest impact on that decision. I think it is safest to  delay cath until 30 days after DCCV (although risk of thrombus formation is relatively low as described above).  Also her creatinine currently 1.5 from baseline 1.2. As outlined in Dr.  Klein's note, if she has SAVR she can have a surgical maze procedure. Otherwise, if TAVR candidate, can consider endocardial cavotricuspid isthmus ablation.  4. HTN: BP stable. Home losartan on hold to allow titration of rate controlling medications - Continue current regimen  5. HLD: Last LDL 97; reported intolerance to atorvastatin, simvastatin, and rosuvastatin . - Trialing pravastatin this admission  6. Dyspnea and hypoxemia. Rales noted on exam. Will give IV lasix x 1 today. Ambulate with PT. Check BMET in am.    For questions or updates, please contact [place] Please consult [email or web address] for contact info under Cardiology/STEMI.      Signed, Mineola Duan [place], MD  02/18/2018, 11:13 AM

## 2018-02-18 NOTE — Progress Notes (Signed)
Attempted to wean oxygen to off.  Sats decreased to 84% after a few minutes on room air at rest.  Oxygen reapplied at [facility]. Sats increased to the low 90's.

## 2018-02-19 ENCOUNTER — Telehealth: Payer: Self-pay | Admitting: Cardiovascular Disease

## 2018-02-19 LAB — GLUCOSE, CAPILLARY
Glucose-Capillary: 231 mg/dL — ABNORMAL HIGH (ref 70–99)
Glucose-Capillary: 405 mg/dL — ABNORMAL HIGH (ref 70–99)

## 2018-02-19 MED ORDER — LEVOFLOXACIN 500 MG PO TABS: 500.0000 mg | ORAL_TABLET | Freq: Every day | ORAL | Status: DC

## 2018-02-19 MED ORDER — METOPROLOL TARTRATE 25 MG PO TABS: 25.0000 mg | ORAL_TABLET | Freq: Two times a day (BID) | ORAL | 6 refills | Status: DC

## 2018-02-19 MED ORDER — APIXABAN 5 MG PO TABS: 5.0000 mg | ORAL_TABLET | Freq: Two times a day (BID) | ORAL | 11 refills | Status: DC

## 2018-02-19 MED ORDER — AMLODIPINE BESYLATE 2.5 MG PO TABS: 2.5000 mg | ORAL_TABLET | Freq: Every day | ORAL | Status: DC

## 2018-02-19 MED ORDER — AMLODIPINE BESYLATE 2.5 MG PO TABS: 2.5000 mg | ORAL_TABLET | Freq: Every day | ORAL | 6 refills | Status: DC

## 2018-02-19 MED ORDER — LEVOFLOXACIN 500 MG PO TABS: 500.0000 mg | ORAL_TABLET | Freq: Every day | ORAL | 0 refills | Status: DC

## 2018-02-19 MED ORDER — LEVOFLOXACIN 750 MG PO TABS: 750.0000 mg | ORAL_TABLET | Freq: Every day | ORAL | Status: DC

## 2018-02-19 MED ORDER — AZITHROMYCIN 250 MG PO TABS: 250.0000 mg | ORAL_TABLET | Freq: Every day | ORAL | Status: DC

## 2018-02-19 MED ORDER — AMIODARONE HCL 200 MG PO TABS: 200.0000 mg | ORAL_TABLET | Freq: Every day | ORAL | 0 refills | Status: DC

## 2018-02-19 MED ORDER — AZITHROMYCIN 250 MG PO TABS
500.0000 mg | ORAL_TABLET | Freq: Every day | ORAL | Status: AC
  Administered 2018-02-19: 500 mg via ORAL
  Filled 2018-02-19: qty 2

## 2018-02-19 NOTE — Progress Notes (Signed)
PHARMACY NOTE:  ANTIMICROBIAL RENAL DOSAGE ADJUSTMENT  Current antimicrobial regimen includes a mismatch between antimicrobial dosage and estimated renal function.  As per policy approved by the Pharmacy & Therapeutics and Medical Executive Committees, the antimicrobial dosage will be adjusted accordingly.  Current antimicrobial dosage:  Levofloxacin 750 mg oral every 24 hours  Indication: pneumonia   Renal Function:   Estimated Creatinine Clearance: 38.4 mL/min (A) (by C-G formula based on SCr of 1.67 mg/dL (H)). []      On intermittent HD, scheduled: []      On CRRT    Antimicrobial dosage has been changed to:  Levofloxacin 500 mg oral every 24 hours  Thank you for allowing pharmacy to be a part of this patient's care.  Doylene Canard, PharmD Clinical Pharmacist  Pager: [phone] Phone: [phone] 02/19/2018 1:55 PM

## 2018-02-19 NOTE — Consult Note (Signed)
   [facility] CM Inpatient Consult   02/19/2018  NAARA KELTY [birth year] [id number]    Referral received from DM Coordinator for [place] Management program.  Martin Majestic to bedside to speak with Mrs. Nemetz just prior to hospital discharge to discuss [place] Management program.   She pleasantly declined [place] Management services. States " I know what to do regarding my diabetes, I just need to do it". Declined needing any [facility] follow up as well. States " I will call you if I need you".   Provided 24-hr nurse advice line magnet and [facility] Management brochure with contact information.  Appreciative of visit.   Marthenia Rolling, MSN-Ed, RN,BSN [facility] Liaison [phone]

## 2018-02-19 NOTE — Telephone Encounter (Signed)
New Message:     Pt said she was just discharged from the hospital this afternoon. She have some questions about her medicine.

## 2018-02-19 NOTE — Progress Notes (Signed)
Patient has been on room air since 0800 and oxygen saturation has remained above 92%.  Will ambulate and monitor saturation.

## 2018-02-19 NOTE — Care Management Important Message (Signed)
Important Message  Patient Details  Name: [name] MRN: [id number] Date of Birth: [birth year]   Medicare Important Message Given:  Yes    Alanii Ramer P [place] 02/19/2018, 2:16 PM

## 2018-02-19 NOTE — Discharge Summary (Signed)
Discharge Summary    Patient ID: [name],  MRN: [id number], DOB/AGE: 04-01-1951 67 y.o.  Admit date: 02/14/2018 Discharge date: 02/19/2018  Primary Care Provider: Chipper Herb Primary Cardiologist: Minus Breeding, MD  Discharge Diagnoses    Principal Problem:   Atrial fibrillation/flutter, new onset [facility]) Active Problems:   Hyperlipidemia associated with type 2 diabetes mellitus ([place])   Benign essential HTN   Coronary artery disease, non-occlusive   Type II diabetes mellitus with renal manifestations ([place])   TIA (transient ischemic attack), history of   Moderate aortic stenosis by prior echocardiogram   Non-STEMI (non-ST elevated myocardial infarction) [facility]): troponin increased to ~with chest pain in setting of new onset A. fib.  7   Atrial flutter with rapid ventricular response (HCC)   Allergies Allergies  Allergen Reactions  . Atorvastatin     Myalgias   . Crestor [Rosuvastatin Calcium] Other (See Comments)    Stiffness and back pain  . Ezetimibe-Simvastatin Other (See Comments)    Leg cramps  . Celebrex [Celecoxib] Rash  . Levemir [Insulin Detemir] Rash  . Penicillins Other (See Comments)    SYNCOPE (Tolerated Ancef) Has patient had a PCN reaction causing immediate rash, facial/tongue/throat swelling, SOB or lightheadedness with hypotension: Yes Has patient had a PCN reaction causing severe rash involving mucus membranes or skin necrosis: No Has patient had a PCN reaction that required hospitalization No Has patient had a PCN reaction occurring within the last 10 years: No If all of the above answers are "NO", then may proceed with Cephalosporin use.    Diagnostic Studies/Procedures    TEE on 02/16/18:Study Conclusions  - Left ventricle: Severe LVH. Small LV cavity. Systolic function was vigorous. The estimated ejection fraction was in the range of 65% to 70%. Wall motion was normal; there were no regional wall motion abnormalities. -  Aortic valve: Calcified with restricted leaflet motion, especially the non-coronary cusp which appears immobile. Severe stenosis. Mean gradient (S): 32 mm Hg. Peak gradient (S): 57 mm Hg. Valve area (VTI): 0.9 cm^2. Valve area (Vmax): 0.92 cm^2. Valve area (Vmean): 0.7 cm^2. - Mitral valve: Mildly calcified annulus. Sclerotic leafelts, possibly rheumatic. Mild regurgitation. - Left atrium: The atrium was dilated. No evidence of thrombus in the atrial cavity or appendage. - Right atrium: The atrium was dilated. - Atrial septum: No defect or patent foramen ovale was identified. - Tricuspid valve: There was mild regurgitation. - Pericardium, extracardiac: Small pericardial effusion.  Impressions:  - LVEF 65-70%, severe LVH, severe aortic stenosis - mean gradient of 32 mmHg - AVA 0.9 cm2, small pericardial effusion. Successful DCCV to junctional rhythm.     History of Present Illness     [name] is a 67yo F with a hx of nonobstructive CAD (per cath 2006, normal nuc 2011), CKD stage III, dyslipidemia, hypertension, DM 2, moderate AS, carotid stenosis, known small pericardial effusion (per echo 2013l), stroke/TIA and OSA not on CPAP who presented to [facility] with c/o left chest/breast discomfort which initially began intermittently approximately one week ago and found to have afib/aflutter.  She described the discomfort as an "ache" which did not radiated. She had no associated symptoms. She cannot recalled if the pain was in relation to exertion or rest and she did not took anything to help. She first thought that the discomfort was "indigestion" and she drank a soda with some relief. She woke up feeling warm and "shaky and EMS activated, pt was found to be  in atrial fibrillation with a rate in the 180's initially. She was treated with IV Cardizem bolus 10 mg x2 and was started on a diltiazem gtt at 15mg /hr.  Her heart rate improved, however remains in the  120-130's. She denies tobacco, alcohol, or illicit drug use. She has a family hx of CAD in her mother (CABG) and her sister with MI.  Cardiology has been asked to admit given her hx and new onset AF.   Hospital Course     Consultants: None  1. New onset atrial flutter/fibrillation:presented with Afib RVR with rates in the 180s. Rate improved on IV cardizem but remained symptomatic.  S/p successful TEE/DCCV.  - Maintaining sinus rhythm with PVCs on amiodarone(plan to continue for 30 days per Dr. Sallyanne Kuster) and metoprolol orally. Eliquis for anticoagulation - it is likely that her dominant arrhythmia (typical counterclockwise atrial flutter) is related to untreated OSA, rather than the severe aortic stenosis.  2. NSTEMI/CP in patient with non-obstructive YSA:[id number] with intermittent chest discomfort for 1 week prior to admission, likely in the setting or atrial fibrillation/flutter with RVR. Troponin peaked at 7.11. Last LHC 2006 with non-obstructive disease; NST 2011 non-ischemic. Likely elevation in troponin 2/2 demand ischemia in the setting of Afib/fluter with RVR. Aortic stenosis could also be contributing to elevated troponin. Plan [facility] +/- LHC for evaluation of possible TAVR. Plan to wait until 30 days after DCCV given need for uninterrupted anticoagulation.  3. Moderate to severe NA:TFTDD on last echo 06/2017. Asymptomatic - The aortic valve changes are highly consistent with degenerative disease, with free commissures and heavy calcification and thickening at the leaflet bases. While the mitral valve leaflets are rather thick and there is some restricted motion (possibly rheumatic), there is no mitral stenosis. - Follow up with valve clinic as outpatient. She is reluctant to consider SAVR, but more eager for TAVR. She will either need TAVR + afib/flutter ablation or SAVR/CABG/MAZE.  4. HTN:BP minimally elevated. Home losartan on hold to allow titration of rate controlling  medications. Also have AKI.  - Continue BB. Will add amlodipine.   5. UKG:URKY LDL 97; reported intolerance to atorvastatin, simvastatin, and rosuvastatin . - Trialing pravastatin this admission. IF CAD >> will need lipid clinic evaluation.   6. HAP - had hypoxia day prior to discharge. Breathing improved after IV lasix x 1. CXR showed right sided pnenumonia. She was started on azithromycin.  However, given that her leukocytosis and CXR finings are new in the last 2 days this would be considered HAP and not CAP.  We will switch azithromycin to levofloxacin 750mg  x6 more days.  QTc is OK.   7. DM - A1c 10.1. Seen by diabetic coordinator. Follow up with PCP.   8. Acute on CKD stage III - Scr of 1.66 today. Baseline 1.2-1.4. Losartan on hold. Recheck lab at follow and restart at that time.   9. Hx of Stroke/TIA - Will stop Plavix due to need of anticoagulation  10. OSA - Will need outpatient evaluation of CPAP.  PT recommended outpatient cardiac vs pulmonary rehab. However will plan outpatient PT given upcoming evaluation. Discussed with Case Manager.   Discharge Vitals Blood pressure (!) 152/59, pulse 79, temperature 97.8 F (36.6 C), temperature source Oral, resp. rate 14, height 5\' 7"  (1.702 m), weight 206 lb 6.4 oz (93.6 kg), SpO2 96 %.  Filed Weights   02/17/18 0552 02/18/18 0456 02/19/18 0442  Weight: 220 lb 0.3 oz (99.8 kg) 214 lb 4.8 oz (97.2 kg) 206 lb  6.4 oz (93.6 kg)    Labs & Radiologic Studies    CBC Recent Labs    02/17/18 0431  WBC 12.5*  HGB 13.1  HCT 40.9  MCV 86.5  PLT 299   Basic Metabolic Panel Recent Labs    02/18/18 0627  NA 139  K 4.3  CL 102  CO2 28  GLUCOSE 301*  BUN 39*  CREATININE 1.67*  CALCIUM 9.2  MG 1.8   _____________  Dg Chest 2 View  Result Date: 02/18/2018 CLINICAL DATA:  Cough and wheezing EXAM: CHEST - 2 VIEW COMPARISON:  February 14, 2018 FINDINGS: There is airspace consolidation throughout much of the mid and lower lung  zones on the right with right pleural effusion. Left lung is clear. Heart is upper normal in size with pulmonary vascularity normal. There is aortic atherosclerosis. No adenopathy. There is degenerative change in the thoracic spine. IMPRESSION: Multifocal airspace consolidation consistent with pneumonia on the right. Fairly small right pleural effusion. Left lung clear. Heart upper normal in size. Aortic atherosclerosis. Aortic Atherosclerosis (ICD10-I70.0). Electronically Signed   By: Lowella Grip III M.D.   On: 02/18/2018 13:42   Dg Chest Port 1 View  Result Date: 02/14/2018 CLINICAL DATA:  Chest pain with palpitations EXAM: PORTABLE CHEST 1 VIEW COMPARISON:  Jan 08, 2014 FINDINGS: There is no edema or consolidation. Heart is mildly enlarged with pulmonary vascularity normal. No adenopathy. There is aortic atherosclerosis. No evident bone lesions. No pneumothorax. IMPRESSION: No edema or consolidation. Mild cardiac enlargement. There is aortic atherosclerosis. Aortic Atherosclerosis (ICD10-I70.0). Electronically Signed   By: Lowella Grip III M.D.   On: 02/14/2018 11:54   Disposition   Pt is being discharged home today in good condition.  Follow-up Plans & Appointments    Follow-up Information    [facility] Follow up.   Specialty:  Cardiology Why:  office will call with time and date with structural heart term  Contact information: [address], [address] [place]       Chipper Herb, MD Follow up in 5 day(s).   Specialty:  Family Medicine Why:  for penumonia and elevated blood sugar Contact information: [place] [address] [phone]          Discharge Instructions    Diet - low sodium heart healthy   Complete by:  As directed    Increase activity slowly   Complete by:  As directed       Discharge Medications   Allergies as of 02/19/2018      Reactions   Atorvastatin     Myalgias   Crestor [rosuvastatin Calcium] Other (See Comments)   Stiffness and back pain   Ezetimibe-simvastatin Other (See Comments)   Leg cramps   Celebrex [celecoxib] Rash   Levemir [insulin Detemir] Rash   Penicillins Other (See Comments)   SYNCOPE (Tolerated Ancef) Has patient had a PCN reaction causing immediate rash, facial/tongue/throat swelling, SOB or lightheadedness with hypotension: Yes Has patient had a PCN reaction causing severe rash involving mucus membranes or skin necrosis: No Has patient had a PCN reaction that required hospitalization No Has patient had a PCN reaction occurring within the last 10 years: No If all of the above answers are "NO", then may proceed with Cephalosporin use.      Medication List    STOP taking these medications   aspirin 325 MG tablet  (was taking PRN)   clopidogrel 75 MG  tablet Commonly known as:  PLAVIX   losartan 25 MG tablet Commonly known as:  COZAAR     TAKE these medications   ACCU-CHEK AVIVA PLUS test strip Generic drug:  glucose blood USE TO CHECK BLOOD SUGAR UP TO TWICE DAILY OR AS INSTRUCTED   amiodarone 200 MG tablet Commonly known as:  PACERONE Take 1 tablet (200 mg total) by mouth daily. Start taking on:  02/20/2018   amLODipine 2.5 MG tablet Commonly known as:  NORVASC Take 1 tablet (2.5 mg total) by mouth daily.   apixaban 5 MG Tabs tablet Commonly known as:  ELIQUIS Take 1 tablet (5 mg total) by mouth 2 (two) times daily.   B-D INS SYR ULTRAFINE 1CC/31G 31G X 5/16" 1 ML Misc Generic drug:  Insulin Syringe-Needle U-100 USE TO INJECT INSULIN TWICE A DAY AS INSTRUCTED   Calcium Carbonate-Vitamin D 600-400 MG-UNIT tablet Commonly known as:  CALCIUM 600+D Take 1 tablet by mouth 2 (two) times daily.   cholecalciferol 1000 units tablet Commonly known as:  VITAMIN D Take 1,000 Units by mouth daily.   fenofibrate 145 MG tablet Commonly known as:  TRICOR TAKE 1 TABLET BY MOUTH ONCE DAILY   Insulin Pen  Needle 32G X 4 MM Misc Use to inject insulin with insulin pen   levofloxacin 500 MG tablet Commonly known as:  LEVAQUIN Take 1 tablet (500 mg total) by mouth daily. Start taking on:  02/20/2018   LIVALO 4 MG Tabs Generic drug:  Pitavastatin Calcium TAKE 1 TABLET BY MOUTH ONCE DAILY   meclizine 12.5 MG tablet Commonly known as:  ANTIVERT Take 1 tablet (12.5 mg total) by mouth 3 (three) times daily as needed for dizziness.   metFORMIN 1000 MG tablet Commonly known as:  GLUCOPHAGE Take 1 Tablet by mouth 2 times a day   metoprolol tartrate 25 MG tablet Commonly known as:  LOPRESSOR Take 1 tablet (25 mg total) by mouth 2 (two) times daily.   multivitamin with minerals Tabs tablet Take 1 tablet by mouth 2 (two) times daily.   NOVOLIN 70/30 RELION (70-30) 100 UNIT/ML injection Generic drug:  insulin NPH-regular Human Inject 15-65 Units into the skin See admin instructions. Taking 65 units with breakfast and 15 units at lunch and 65 units with dinner.   ondansetron 4 MG disintegrating tablet Commonly known as:  ZOFRAN ODT Take 1 tablet (4 mg total) by mouth every 8 (eight) hours as needed for nausea.   sodium chloride 0.65 % Soln nasal spray Commonly known as:  OCEAN Place 1 spray into both nostrils as needed for congestion.   SYSTANE COMPLETE 0.6 % Soln Generic drug:  Propylene Glycol Apply 1 drop to eye as needed (dry eyes).   vitamin E 400 UNIT capsule Take 400 Units by mouth daily.        Acute coronary syndrome (MI, NSTEMI, STEMI, etc) this admission?:  No.  The elevated Troponin was due to the acute medical illness or demand ischemia.    Outstanding Labs/Studies   BMET at follow up  Duration of Discharge Encounter   Greater than 30 minutes including physician time.  Mahalia Longest [place], PA 02/19/2018, 2:23 PM

## 2018-02-19 NOTE — Progress Notes (Signed)
Inpatient Diabetes Program Recommendations  AACE/ADA: New Consensus Statement on Inpatient Glycemic Control (2019)  Target Ranges:  Prepandial:   less than 140 mg/dL      Peak postprandial:   less than 180 mg/dL (1-2 hours)      Critically ill patients:  140 - 180 mg/dL   Results for MALISSIA, RABBANI (MRN [id number]) as of 02/19/2018 10:27  Ref. Range 02/18/2018 07:27 02/18/2018 11:59 02/18/2018 16:17 02/18/2018 20:51 02/19/2018 07:42  Glucose-Capillary Latest Ref Range: 70 - 99 mg/dL 281 (H) 299 (H) 245 (H) 404 (H) 231 (H)  Results for TRUST, CRAGO (MRN [id number]) as of 02/19/2018 10:27  Ref. Range 10/02/2015 15:10 02/14/2018 19:37  Hemoglobin A1C Latest Ref Range: 4.8 - 5.6 % 12.9 10.1 (H)   Review of Glycemic Control  Diabetes history: DM2 Outpatient Diabetes medications: 70/30 65 units with breakfast, 15 units with lunch, 65 units with supper, Metformin 1000 mg BID Current orders for Inpatient glycemic control: Lantus 15 units BID, Novolog 0-20 units TID with meals  Inpatient Diabetes Program Recommendations:  Insulin - Basal: Please consider increasing Lantus to 20 units BID. Insulin-Correction: Please consider ordering Novolog 0-5 units QHS for bedtime correction. Insulin - Meal Coverage: Please consider ordering Novolog 10 units TID with meals for meal coverage if patient eats at least 50% of meals. HgbA1C: A1C 10.1% on 02/14/2018 indicating an average glucose of 243 mg/dl over the past 2-3 months.  Thanks, Barnie Alderman, RN, MSN, CDE Diabetes Coordinator Inpatient Diabetes Program [phone] (Team Pager from 8am to 5pm)

## 2018-02-19 NOTE — Telephone Encounter (Signed)
Spoke with pt, when she picked up her medications from the pharmacy they told her to call us about the interaction between levofloxacin and her other medications. Discussed with dr croitoru, aware by the time amiodarone gets in her system the levofloxacin will be done, it is okay for her to take both.

## 2018-02-19 NOTE — Progress Notes (Signed)
Progress Note  Patient Name: [name] Date of Encounter: 02/19/2018  Primary Cardiologist: Minus Breeding, MD   Subjective   Breathing resolved. No chest pain. Cough improving. CXR consistent with pneumonia on the right.  Inpatient Medications    Scheduled Meds: . amiodarone  200 mg Oral Daily  . apixaban  5 mg Oral BID  . insulin aspart  0-20 Units Subcutaneous TID WC  . insulin glargine  15 Units Subcutaneous BID  . metoprolol tartrate  25 mg Oral BID  . multivitamin with minerals  1 tablet Oral BID  . pravastatin  80 mg Oral q1800  . sodium chloride flush  3 mL Intravenous Q12H   Continuous Infusions: . sodium chloride 10 mL/hr at 02/15/18 0434  . sodium chloride     PRN Meds: acetaminophen, ondansetron (ZOFRAN) IV, sodium chloride flush   Vital Signs    Vitals:   02/18/18 1617 02/18/18 2049 02/19/18 0437 02/19/18 0442  BP: (!) 147/67 (!) 144/60 (!) 152/59   Pulse: 83 85 71   Resp: (!) 21 (!) 23 14   Temp:  98.4 F (36.9 C) 97.8 F (36.6 C)   TempSrc:  Oral Oral   SpO2: 92% 97% 98%   Weight:    206 lb 6.4 oz (93.6 kg)  Height:        Intake/Output Summary (Last 24 hours) at 02/19/2018 1041 Last data filed at 02/19/2018 0800 Gross per 24 hour  Intake 720 ml  Output 2900 ml  Net -2180 ml   Filed Weights   02/17/18 0552 02/18/18 0456 02/19/18 0442  Weight: 220 lb 0.3 oz (99.8 kg) 214 lb 4.8 oz (97.2 kg) 206 lb 6.4 oz (93.6 kg)    Telemetry    SR with PVCs - Personally Reviewed  ECG    N/A  Physical Exam   GEN: No acute distress.   Neck: No JVD Cardiac: RRR, 3/6 systolic murmurs, rubs, or gallops.  Respiratory: Clear to auscultation bilaterally. GI: Soft, nontender, non-distended  MS: No edema; No deformity. Neuro:  Nonfocal  Psych: Normal affect   Labs    Chemistry Recent Labs  Lab 02/14/18 1139  02/14/18 1937 02/15/18 0239 02/18/18 0627  NA  --    < > 142 140 139  K  --    < > 5.1 4.5 4.3  CL  --    < > 109 108 102  CO2   --   --  21* 24 28  GLUCOSE  --    < > 378* 324* 301*  BUN  --    < > 27* 30* 39*  CREATININE  --    < > 1.64* 1.57* 1.67*  CALCIUM  --   --  8.5* 8.9 9.2  PROT 5.5*  --   --   --   --   ALBUMIN 3.2*  --   --   --   --   AST 39  --   --   --   --   ALT 28  --   --   --   --   ALKPHOS 34*  --   --   --   --   BILITOT 0.8  --   --   --   --   GFRNONAA  --   --  31* 33* 31*  GFRAA  --   --  36* 38* 36*  ANIONGAP  --   --  12 8 9    < > =  values in this interval not displayed.     Hematology Recent Labs  Lab 02/15/18 0239 02/16/18 0407 02/17/18 0431  WBC 10.5 11.7* 12.5*  RBC 4.67 4.64 4.73  HGB 12.9 12.7 13.1  HCT 40.2 40.0 40.9  MCV 86.1 86.2 86.5  MCH 27.6 27.4 27.7  MCHC 32.1 31.8 32.0  RDW 14.2 14.1 14.3  PLT 275 245 244    Cardiac Enzymes Recent Labs  Lab 02/14/18 1937 02/15/18 0239 02/15/18 0742  TROPONINI 7.11* 7.10* 4.51*    Recent Labs  Lab 02/14/18 1157  TROPIPOC 0.07    Radiology    Dg Chest 2 View  Result Date: 02/18/2018 CLINICAL DATA:  Cough and wheezing EXAM: CHEST - 2 VIEW COMPARISON:  February 14, 2018 FINDINGS: There is airspace consolidation throughout much of the mid and lower lung zones on the right with right pleural effusion. Left lung is clear. Heart is upper normal in size with pulmonary vascularity normal. There is aortic atherosclerosis. No adenopathy. There is degenerative change in the thoracic spine. IMPRESSION: Multifocal airspace consolidation consistent with pneumonia on the right. Fairly small right pleural effusion. Left lung clear. Heart upper normal in size. Aortic atherosclerosis. Aortic Atherosclerosis (ICD10-I70.0). Electronically Signed   By: Lowella Grip III M.D.   On: 02/18/2018 13:42    Cardiac Studies   TEE on 02/16/18:Study Conclusions  - Left ventricle: Severe LVH. Small LV cavity. Systolic function was vigorous. The estimated ejection fraction was in the range of 65% to 70%. Wall motion was normal; there were  no regional wall motion abnormalities. - Aortic valve: Calcified with restricted leaflet motion, especially the non-coronary cusp which appears immobile. Severe stenosis. Mean gradient (S): 32 mm Hg. Peak gradient (S): 57 mm Hg. Valve area (VTI): 0.9 cm^2. Valve area (Vmax): 0.92 cm^2. Valve area (Vmean): 0.7 cm^2. - Mitral valve: Mildly calcified annulus. Sclerotic leafelts, possibly rheumatic. Mild regurgitation. - Left atrium: The atrium was dilated. No evidence of thrombus in the atrial cavity or appendage. - Right atrium: The atrium was dilated. - Atrial septum: No defect or patent foramen ovale was identified. - Tricuspid valve: There was mild regurgitation. - Pericardium, extracardiac: Small pericardial effusion.  Impressions:  - LVEF 65-70%, severe LVH, severe aortic stenosis - mean gradient of 32 mmHg - AVA 0.9 cm2, small pericardial effusion. Successful DCCV to junctional rhythm.    Patient Profile     [name] is a 67yo F with a hx of nonobstructive CAD (per cath 2006, normal nuc 2011), CKD stage III, dyslipidemia, hypertension, DM 2, moderate AS, carotid stenosis, known small pericardial effusion (per echo 2013l), stroke/TIA and OSA not on CPAP who is admitted for new onset atrial fibrillation/Flutter.  Assessment & Plan     1. New onset atrial flutter/fibrillation:presented with Afib RVR with rates in the 180s,  s/p successful DCCV.  - Maintaining sinus rhythm with PVCs on amiodarone (plan to continue for 30 days per Dr. Sallyanne Kuster) and metoprolol orally -On Eliquis for anticoagulation - it is likely that her dominant arrhythmia (typical counterclockwise atrial flutter) is related to untreated OSA, rather than the severe aortic stenosis.  2. CP in patient with non-obstructive CHE:NIDPOEUMP with intermittent chest discomfort for 1 week prior to admission, likely in the setting or atrial fibrillation/flutter with RVR. Troponin peaked at  7.11. Last LHC 2006 with non-obstructive disease; NST 2011 non-ischemic. Likely elevation in troponin 2/2 demand ischemia in the setting of Afib/fluter with RVR. Aortic stenosis could also be contributing to elevated troponin.  -  Plan [facility] +/- LHC for evaluation of possible TAVR. Plan to wait until 30 days after DCCV given need for uninterrupted anticoagulation.   3. Moderate to severe CH:ENIDP on last echo 06/2017. Asymptomatic - The aortic valve changes are highly consistent with degenerative disease, with free commissures and heavy calcification and thickening at the leaflet bases. While the mitral valve leaflets are rather thick and there is some restricted motion (possibly rheumatic), there is no mitral stenosis. - Follow up with valve clinic as outpatient. She is reluctant to consider SAVR, but more eager for TAVR.  -Obviously, the extent of CAD on cardiac cath will have the biggest impact on that decision. I think it is safest to delay cath until 30 days after DCCV (although risk of thrombus formation is relatively low as described above).  Also her creatinine currently 1.5 from baseline 1.2. As outlined in Dr. Olin Pia note, if she has SAVR she can have a surgical maze procedure. Otherwise, if TAVR candidate, can consider endocardial cavotricuspid isthmus ablation.  4. HTN:BP stable. Home losartan on hold to allow titration of rate controlling medications. Also have AKI.  - Continue current regimen  5. OEU:MPNT LDL 97; reported intolerance to atorvastatin, simvastatin, and rosuvastatin . - Trialing pravastatin this admission. IF CAD >> will need lipid clinic evaluation.   6. CAP - had hypoxia yesterday. Breathing improved after IV lasix x 1. CXR showed right sided pnenumonia. Will start Z-pack. Follow up with PCP in few days. PT recommended outpatient cardiac vs pulmonary rehab.  7. DM - A1c 10.1. Seen by diabetic coordinator.   8. Acute on CKD stage III - Scr of 1.66 today.  Baseline 1.2-1.4. Losartan on hold. Follow.   Dispo: discharge this afternoon vs tomorrow  For questions or updates, please contact [place] Please consult [email or web address] for contact info under Cardiology/STEMI.      Jarrett Soho, PA  02/19/2018, 10:41 AM

## 2018-02-19 NOTE — Progress Notes (Signed)
Patient ambulated with [employer].  Sats remained greater than 90%.

## 2018-02-19 NOTE — Care Management Note (Addendum)
Case Management Note Marvetta Gibbons RN, BSN Unit 4E-Case Manager-- cross coverage for 6E [phone]  Patient Details  Name: [name] MRN: [id number] Date of Birth: 22-Mar-1951  Subjective/Objective:  Pt admitted with new onset afib                  Action/Plan: PTA pt lived at home with spouse- independent- per PT recommendation - referral for outpt cardiac/pulm. Rehab- PA/MD to place ambulatory referral. Noted pt started on Eliquis- benefits check submitted- per pt she thinks her drug benefit is with [employer] (she uses [facility] in [place]) will try to check coverage for Eliquis- pt will call insurance herself too when she get home and checks her card for who her coverage is with. Pt provided 30 day free card to use on discharge.  Update-1400- call received from PT-Bhagat- regarding oupt PT referral- verbal order received for outpt PT referral - spoke again with pt regarding outpt PT- pt is agreeable - choice offered for location- pt would like referral sent to [facility] location- referral sent via Epic.   Expected Discharge Date:    02/19/18              Expected Discharge Plan:  Home/Self Care  In-House Referral:  NA  Discharge planning Services  CM Consult, Medication Assistance  Post Acute Care Choice:  NA Choice offered to:  NA  DME Arranged:    DME Agency:     HH Arranged:    HH Agency:     Status of Service:  Completed, signed off  If discussed at [place] of Stay Meetings, dates discussed:    Discharge Disposition: home/self care   Additional Comments:  Dawayne Patricia, RN 02/19/2018, 1:43 PM

## 2018-02-20 ENCOUNTER — Telehealth: Payer: Self-pay | Admitting: Cardiology

## 2018-02-20 ENCOUNTER — Telehealth: Payer: Self-pay | Admitting: Family Medicine

## 2018-02-20 NOTE — Telephone Encounter (Signed)
New message  Patient wants a sooner hospital f/u. Patient declined to schedule with APP staff. Requesting call from Dr Hochrein's nurse to make sooner appointment for her. Please call

## 2018-02-20 NOTE — Telephone Encounter (Signed)
Returned call to patient of Dr. Percival Spanish who was recently discharged - AF and needs valve clinic referral. Patient wanted to see MD sooner than her August 5 appointment however there are no sooner appointments. She declined APP visit. She is scheduled to see Dr. Angelena Form for valve evaluation on 03/12/18. Routed to MD as Juluis Rainier

## 2018-02-20 NOTE — Telephone Encounter (Signed)
appt scheduled Pt notified 

## 2018-02-21 ENCOUNTER — Encounter: Payer: Self-pay | Admitting: Family Medicine

## 2018-02-21 ENCOUNTER — Ambulatory Visit (INDEPENDENT_AMBULATORY_CARE_PROVIDER_SITE_OTHER): Payer: Medicare Other | Admitting: Family Medicine

## 2018-02-21 VITALS — BP 139/77 | HR 103 | Temp 98.1°F | Ht 67.0 in | Wt 208.1 lb

## 2018-02-21 DIAGNOSIS — N179 Acute kidney failure, unspecified: Secondary | ICD-10-CM | POA: Diagnosis not present

## 2018-02-21 DIAGNOSIS — I4891 Unspecified atrial fibrillation: Secondary | ICD-10-CM

## 2018-02-21 NOTE — Progress Notes (Addendum)
 BP 139/77   Pulse (!) 103   Temp 98.1 F (36.7 C) (Oral)   Ht 5' 7" (1.702 m)   Wt 208 lb 2 oz (94.4 kg)   BMI 32.60 kg/m    Subjective:    Patient ID: [name], female    DOB: 11/18/1950, 67 y.o.   MRN: [id number]  HPI: [name] is a 67 y.o. female presenting on 02/21/2018 for Hospitalization Follow-up   HPI Hospital follow-up for new onset A. fib and CAP Patient is coming in today for hospital follow-up for new onset A. fib and pneumonia and acute kidney injury.  She was also found to have worsened aortic valve and they are hoping to do a replacement.  She says her breathing is doing well and she still has occasional flutters but not like she had when she went to the hospital.  She denies any shortness of breath or wheezing.  She denies any major effects from medication or swelling to this point.  Her renal function was still up at the hospital as well and we will have to keep an eye on this.  She was restarted on amlodipine here because they are trying to hold medications that could hurt the kidneys for blood pressure.  She denies any fevers or chills.  Since leaving the hospital she has not had any chest pain or shortness of breath and is still taking the Levaquin as well.  Patient was in the hospital from 7 3 until 02/19/2018.  She does have follow-up with cardiology as well  Relevant past medical, surgical, family and social history reviewed and updated as indicated. Interim medical history since our last visit reviewed. Allergies and medications reviewed and updated.  Review of Systems  Constitutional: Negative for chills and fever.  HENT: Negative for congestion, ear discharge and ear pain.   Eyes: Negative for redness and visual disturbance.  Respiratory: Negative for cough, chest tightness, shortness of breath and wheezing.   Cardiovascular: Positive for palpitations. Negative for chest pain and leg swelling.  Gastrointestinal: Negative for anal bleeding and  blood in stool.  Genitourinary: Negative for difficulty urinating, dysuria and hematuria.  Musculoskeletal: Negative for back pain and gait problem.  Skin: Negative for rash.  Neurological: Negative for dizziness, weakness, light-headedness, numbness and headaches.  Psychiatric/Behavioral: Negative for agitation and behavioral problems.  All other systems reviewed and are negative.   Per HPI unless specifically indicated above   Allergies as of 02/21/2018      Reactions   Atorvastatin    Myalgias   Crestor [rosuvastatin Calcium] Other (See Comments)   Stiffness and back pain   Ezetimibe-simvastatin Other (See Comments)   Leg cramps   Celebrex [celecoxib] Rash   Levemir [insulin Detemir] Rash   Penicillins Other (See Comments)   SYNCOPE (Tolerated Ancef) Has patient had a PCN reaction causing immediate rash, facial/tongue/throat swelling, SOB or lightheadedness with hypotension: Yes Has patient had a PCN reaction causing severe rash involving mucus membranes or skin necrosis: No Has patient had a PCN reaction that required hospitalization No Has patient had a PCN reaction occurring within the last 10 years: No If all of the above answers are "NO", then may proceed with Cephalosporin use.      Medication List        Accurate as of 02/21/18  2:44 PM. Always use your most recent med list.          ACCU-CHEK AVIVA PLUS test strip Generic drug:  glucose   blood USE TO CHECK BLOOD SUGAR UP TO TWICE DAILY OR AS INSTRUCTED   amiodarone 200 MG tablet Commonly known as:  PACERONE Take 1 tablet (200 mg total) by mouth daily.   amLODipine 2.5 MG tablet Commonly known as:  NORVASC Take 1 tablet (2.5 mg total) by mouth daily.   apixaban 5 MG Tabs tablet Commonly known as:  ELIQUIS Take 1 tablet (5 mg total) by mouth 2 (two) times daily.   B-D INS SYR ULTRAFINE 1CC/31G 31G X 5/16" 1 ML Misc Generic drug:  Insulin Syringe-Needle U-100 USE TO INJECT INSULIN TWICE A DAY AS  INSTRUCTED   Calcium Carbonate-Vitamin D 600-400 MG-UNIT tablet Commonly known as:  CALCIUM 600+D Take 1 tablet by mouth 2 (two) times daily.   cholecalciferol 1000 units tablet Commonly known as:  VITAMIN D Take 1,000 Units by mouth daily.   fenofibrate 145 MG tablet Commonly known as:  TRICOR TAKE 1 TABLET BY MOUTH ONCE DAILY   Insulin Pen Needle 32G X 4 MM Misc Use to inject insulin with insulin pen   levofloxacin 500 MG tablet Commonly known as:  LEVAQUIN Take 1 tablet (500 mg total) by mouth daily.   LIVALO 4 MG Tabs Generic drug:  Pitavastatin Calcium TAKE 1 TABLET BY MOUTH ONCE DAILY   meclizine 12.5 MG tablet Commonly known as:  ANTIVERT Take 1 tablet (12.5 mg total) by mouth 3 (three) times daily as needed for dizziness.   metFORMIN 1000 MG tablet Commonly known as:  GLUCOPHAGE Take 1 Tablet by mouth 2 times a day   metoprolol tartrate 25 MG tablet Commonly known as:  LOPRESSOR Take 1 tablet (25 mg total) by mouth 2 (two) times daily.   multivitamin with minerals Tabs tablet Take 1 tablet by mouth 2 (two) times daily.   NOVOLIN 70/30 RELION (70-30) 100 UNIT/ML injection Generic drug:  insulin NPH-regular Human Inject 15-65 Units into the skin See admin instructions. Taking 65 units with breakfast and 15 units at lunch and 65 units with dinner.   ondansetron 4 MG disintegrating tablet Commonly known as:  ZOFRAN ODT Take 1 tablet (4 mg total) by mouth every 8 (eight) hours as needed for nausea.   sodium chloride 0.65 % Soln nasal spray Commonly known as:  OCEAN Place 1 spray into both nostrils as needed for congestion.   SYSTANE COMPLETE 0.6 % Soln Generic drug:  Propylene Glycol Apply 1 drop to eye as needed (dry eyes).   vitamin E 400 UNIT capsule Take 400 Units by mouth daily.          Objective:    BP 139/77   Pulse (!) 103   Temp 98.1 F (36.7 C) (Oral)   Ht 5' 7" (1.702 m)   Wt 208 lb 2 oz (94.4 kg)   BMI 32.60 kg/m   Wt Readings  from Last 3 Encounters:  02/21/18 208 lb 2 oz (94.4 kg)  02/19/18 206 lb 6.4 oz (93.6 kg)  12/14/17 206 lb (93.4 kg)    Physical Exam  Constitutional: She is oriented to person, place, and time. She appears well-developed and well-nourished. No distress.  Eyes: Pupils are equal, round, and reactive to light. Conjunctivae and EOM are normal.  Cardiovascular: Normal rate, regular rhythm and intact distal pulses.  Murmur (4 out of 6 holosystolic murmur best heard in the aortic region) heard. Pulmonary/Chest: Effort normal and breath sounds normal. No respiratory distress. She has no wheezes.  Musculoskeletal: Normal range of motion. She exhibits edema (Trace).    Neurological: She is alert and oriented to person, place, and time. Coordination normal.  Skin: Skin is warm and dry. No rash noted. She is not diaphoretic.  Psychiatric: She has a normal mood and affect. Her behavior is normal.  Nursing note and vitals reviewed.      Assessment & Plan:   Problem List Items Addressed This Visit      Cardiovascular and Mediastinum   Atrial fibrillation/flutter, new onset (HCC) - Primary   Relevant Orders   CBC with Differential/Platelet   CMP14+EGFR    Other Visit Diagnoses    Acute kidney injury (HCC)       Relevant Orders   CBC with Differential/Platelet   CMP14+EGFR      Hold metformin until after renal function test today comes back.  If it is improved then we may's restart metformin and stop it 1 week before TAVR.  Continue to use NovoLog 70/30 for diabetes, may have to increase based on numbers, call back in 1 week with blood sugar numbers to see if needs to be adjusted.  Keep appointment with cardiologist and continue Eliquis  Follow up plan: Return in about 1 month (around 03/21/2018), or if symptoms worsen or fail to improve, for Follow-up with PCP in 4 weeks for recheck of A. fib and hypertension.  Counseling provided for all of the vaccine components Orders Placed This  Encounter  Procedures  . CBC with Differential/Platelet  . CMP14+EGFR    Joshua Dettinger, MD [facility] 02/21/2018, 2:44 PM     

## 2018-02-21 NOTE — Patient Instructions (Signed)
Hold metformin until after renal function test today comes back.  If it is improved then we may's restart metformin and stop it 1 week before TAVR.  Continue to use NovoLog 70/30 for diabetes, may have to increase based on numbers, call back in 1 week with blood sugar numbers to see if needs to be adjusted.  Keep appointment with cardiologist and continue Eliquis

## 2018-02-22 ENCOUNTER — Other Ambulatory Visit: Payer: Self-pay | Admitting: *Deleted

## 2018-02-22 DIAGNOSIS — N183 Chronic kidney disease, stage 3 unspecified: Secondary | ICD-10-CM

## 2018-02-22 LAB — CMP14+EGFR
ALT: 18 IU/L (ref 0–32)
AST: 18 IU/L (ref 0–40)
Albumin/Globulin Ratio: 1.4 (ref 1.2–2.2)
Albumin: 3.6 g/dL (ref 3.6–4.8)
Alkaline Phosphatase: 53 IU/L (ref 39–117)
BUN/Creatinine Ratio: 23 (ref 12–28)
BUN: 39 mg/dL — ABNORMAL HIGH (ref 8–27)
Bilirubin Total: 0.2 mg/dL (ref 0.0–1.2)
CO2: 24 mmol/L (ref 20–29)
Calcium: 9.6 mg/dL (ref 8.7–10.3)
Chloride: 98 mmol/L (ref 96–106)
Creatinine, Ser: 1.68 mg/dL — ABNORMAL HIGH (ref 0.57–1.00)
GFR calc Af Amer: 36 mL/min/{1.73_m2} — ABNORMAL LOW (ref >59)
GFR calc non Af Amer: 31 mL/min/{1.73_m2} — ABNORMAL LOW (ref >59)
Globulin, Total: 2.6 g/dL (ref 1.5–4.5)
Glucose: 264 mg/dL — ABNORMAL HIGH (ref 65–99)
Potassium: 5.3 mmol/L — ABNORMAL HIGH (ref 3.5–5.2)
Sodium: 140 mmol/L (ref 134–144)
Total Protein: 6.2 g/dL (ref 6.0–8.5)

## 2018-02-22 LAB — CBC WITH DIFFERENTIAL/PLATELET
Basophils Absolute: 0.1 10*3/uL (ref 0.0–0.2)
Basos: 1 %
EOS (ABSOLUTE): 0.4 10*3/uL (ref 0.0–0.4)
Eos: 5 %
Hematocrit: 42.3 % (ref 34.0–46.6)
Hemoglobin: 13.4 g/dL (ref 11.1–15.9)
Immature Grans (Abs): 0.1 10*3/uL (ref 0.0–0.1)
Immature Granulocytes: 1 %
Lymphocytes Absolute: 2.6 10*3/uL (ref 0.7–3.1)
Lymphs: 29 %
MCH: 27.4 pg (ref 26.6–33.0)
MCHC: 31.7 g/dL (ref 31.5–35.7)
MCV: 87 fL (ref 79–97)
Monocytes Absolute: 0.7 10*3/uL (ref 0.1–0.9)
Monocytes: 8 %
Neutrophils Absolute: 5.2 10*3/uL (ref 1.4–7.0)
Neutrophils: 56 %
Platelets: 359 10*3/uL (ref 150–450)
RBC: 4.89 x10E6/uL (ref 3.77–5.28)
RDW: 14.6 % (ref 12.3–15.4)
WBC: 9 10*3/uL (ref 3.4–10.8)

## 2018-02-27 ENCOUNTER — Telehealth: Payer: Self-pay | Admitting: Cardiology

## 2018-02-27 NOTE — Telephone Encounter (Signed)
Spokes with pt who states she was discharged on 7/8 with amlodipine. She reports that she took it years ago and developed an intolerance to where it cause swelling. She reports that she taking the medication, she had noticed swelling in her legs and weight gain has increased from 206lbs on Sunday to 212 lbs today. She denies any SOB or other symptoms. She states her BP and heart rate has all been WNL. Routing to Dr. Percival Spanish for recommendation.

## 2018-02-27 NOTE — Telephone Encounter (Signed)
Spoke with pt who states that

## 2018-02-27 NOTE — Telephone Encounter (Signed)
New message    Patient calling to report swelling  1. Name of Medication: amLODipine (NORVASC) 2.5 MG tablet  2. How are you currently taking this medication (dosage and times per day)? Take 1 tablet (2.5 mg total) by mouth daily.  3. Are you having a reaction (difficulty breathing--STAT)? ankles  4. What is your medication issue? Patient does not want to medication     1) How much weight have you gained and in what time span? 212-->206  2) If swelling, where is the swelling located? ANKLES  Are you currently taking a fluid pill? NO 3) Are you currently SOB? NO  4) Do you have a log of your daily weights (if so, list)? YES  5) Have you gained 3 pounds in a day or 5 pounds in a week? 212 today, 206 on Sunday  6) Have you traveled recently? No

## 2018-02-27 NOTE — Telephone Encounter (Signed)
OK to stop Norvasc

## 2018-02-27 NOTE — Telephone Encounter (Signed)
Spoke with pt and advised of Dr. Rosezella [place] recommendation. Also advised pt to monitor BP and to contact office if she notice it starting to increase. Pt verbalized understanding.

## 2018-03-09 ENCOUNTER — Telehealth: Payer: Self-pay | Admitting: Cardiology

## 2018-03-09 NOTE — Progress Notes (Signed)
Valve Clinic Consult Note  Chief Complaint  Patient presents with  . New Patient (Initial Visit)    severe aortic stenosis    History of Present Illness: 67 yo female with history of diabetes mellitus, stage 3 chronic kidney disease, CAD, hyperlipidemia, GERD, HTN, morbid obesity, sleep apnea, prior TIA and severe aortic valve stenosis who is referred to the valve clinic today by Dr. Percival Spanish as a new consult for further discussion regarding her aortic valve stenosis. She had non-obstructive CAD by cath in 2006. She was admitted to [facility] 02/14/18 with c/o chest pain and was found to have atrial fibrillation/flutter. She underwent TEE guided DCCV and converted to sinus. Her troponin was found to be elevated, up to 7.1. Cath was deferred given the fact that she was fully anti-coagulated before and after her cardioversion. TEE showed normal LV systolic function. The aortic valve was calcified with limited leaflet mobility. Mean gradient 32 mmHg, peak gradient 57 mmHg, AVA 0.92 cm2, DVI 0.32. She has been on Eliquis following TEE guided DCCV on 02/16/18.   She tells me today that she has been having fatigue, dyspnea and occasional chest pain. Her weight has been stable since discharge. She is retired from [employer]. She is married. She sees the dentist yearly and has no active issues with her teeth.    Primary Care Physician: Chipper Herb, MD Primary Cardiologist: Dr. Minus Breeding Referring Cardiologist: Dr. Minus Breeding  Past Medical History:  Diagnosis Date  . Abnormal Doppler ultrasound of carotid artery    a. 05/2013 - mild plaque, 1-39% BICA, abnormal appearrance of thyroid (TSH wnl in 06/2013).  Marland Kitchen CAD (coronary artery disease)    a. Nonobstructive by cath 2006 - 25% LAD, 25-30% after 1st diag, distal 25% PDA, minor irreg LCx. b. Normal nuc 2011.  . CKD (chronic kidney disease), stage III ([place])   . Cystocele   . Difficult intubation 09-07-2012   big neck trouble with intubation parotid  surgery"takes little med to sedate"  . Dyslipidemia   . Esophageal stricture   . GERD (gastroesophageal reflux disease)   . Heart murmur   . Hiatal hernia    a. 07/2013: HH with small stricture holding up barium tablet (concides with patient's sx of food sticking).  . History of kidney stones   . Hyperlipidemia   . Hypertension    dr Percival Spanish  . IBS (irritable bowel syndrome)   . Memory difficulty 11/26/2015  . Morbid obesity ([place])   . Osteoarthritis    "all over" (02/14/2018)  . Osteopenia   . Osteoporosis   . Palpitations    a. PVC/PAC on EKG 08/2013.  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  Marland Kitchen Pericardial effusion    a. Mod by echo 2012 at time of PNA. b. Echo 07/2012: small pericardial effusion vs fat.  Marland Kitchen PONV (postoperative nausea and vomiting)    n/v, and room spinning after cataract and parotid surgery  . Sleep apnea     "have mask; don't use it" (02/14/2018)  . Superficial phlebitis   . TIA (transient ischemic attack) 10/2014; 06/2016   "some memory issues since; daughter says my speech is sometimes different" (02/14/2018)  . Type II diabetes mellitus ([place])     Past Surgical History:  Procedure Laterality Date  . BREATH TEK H PYLORI N/A 07/29/2013   Procedure: BREATH TEK H PYLORI;  Surgeon: Shann Medal, MD;  Location: [facility];  Service: General;  Laterality: N/A;  .  CARDIAC CATHETERIZATION  2006  . CARDIOVERSION N/A 02/16/2018   Procedure: CARDIOVERSION;  Surgeon: Pixie Casino, MD;  Location: [facility];  Service: Cardiovascular;  Laterality: N/A;  . CARPAL TUNNEL RELEASE Left 2011  . CATARACT EXTRACTION W/ INTRAOCULAR LENS  IMPLANT, BILATERAL Bilateral 2003   left  . CHOLECYSTECTOMY OPEN  1982  . COLONOSCOPY N/A 07/14/2015   Procedure: COLONOSCOPY;  Surgeon: Ladene Artist, MD;  Location: [facility];  Service: Endoscopy;  Laterality: N/A;  . ESOPHAGOGASTRODUODENOSCOPY N/A 12/27/2013   Procedure: ESOPHAGOGASTRODUODENOSCOPY (EGD);   Surgeon: Shann Medal, MD;  Location: [facility];  Service: General;  Laterality: N/A;  . ESOPHAGOGASTRODUODENOSCOPY (EGD) WITH ESOPHAGEAL DILATION    . LAPAROSCOPIC GASTRIC SLEEVE RESECTION N/A 01/20/2014   Procedure: LAPAROSCOPIC GASTRIC SLEEVE RESECTION;  Surgeon: Shann Medal, MD;  Location: [facility];  Service: General;  Laterality: N/A;  . PAROTIDECTOMY  09/07/2012   Procedure: PAROTIDECTOMY;  Surgeon: Melida Quitter, MD;  Location: [facility];  Service: ENT;  Laterality: Left;  LEFT PAROTIDECTOMY  . TEE WITHOUT CARDIOVERSION N/A 02/16/2018   Procedure: TRANSESOPHAGEAL ECHOCARDIOGRAM (TEE);  Surgeon: Pixie Casino, MD;  Location: [facility];  Service: Cardiovascular;  Laterality: N/A;  . TOE AMPUTATION Left 07/2017   great toe; [facility]   . TONSILLECTOMY  1968  . TUBAL LIGATION  yrs ago  . UPPER GI ENDOSCOPY  01/20/2014   Procedure: UPPER GI ENDOSCOPY;  Surgeon: Shann Medal, MD;  Location: [facility];  Service: General;;    Current Outpatient Medications  Medication Sig Dispense Refill  . ACCU-CHEK AVIVA PLUS test strip USE TO CHECK BLOOD SUGAR UP TO TWICE DAILY OR AS INSTRUCTED 100 each 2  . amiodarone (PACERONE) 200 MG tablet Take 1 tablet (200 mg total) by mouth daily. 30 tablet 0  . apixaban (ELIQUIS) 5 MG TABS tablet Take 1 tablet (5 mg total) by mouth 2 (two) times daily. 60 tablet 11  . B-D INS SYR ULTRAFINE 1CC/31G 31G X 5/16" 1 ML MISC USE TO INJECT INSULIN TWICE A DAY AS INSTRUCTED 100 each 2  . Calcium Carbonate-Vitamin D (CALCIUM 600+D) 600-400 MG-UNIT per tablet Take 1 tablet by mouth 2 (two) times daily.    . cholecalciferol (VITAMIN D) 1000 UNITS tablet Take 1,000 Units by mouth daily.     . Insulin Pen Needle 32G X 4 MM MISC Use to inject insulin with insulin pen 100 each 4  . levofloxacin (LEVAQUIN) 500 MG tablet Take 1 tablet (500 mg total) by mouth daily. 6 tablet 0  . LIVALO 4 MG TABS TAKE 1 TABLET BY MOUTH ONCE DAILY 90 tablet 1  . meclizine (ANTIVERT) 12.5 MG tablet Take  1 tablet (12.5 mg total) by mouth 3 (three) times daily as needed for dizziness. 30 tablet 1  . metFORMIN (GLUCOPHAGE) 1000 MG tablet TAKE 1 TABLET BY MOUTH 2 TIMES A DAY 180 tablet 0  . metoprolol tartrate (LOPRESSOR) 25 MG tablet Take 1 tablet (25 mg total) by mouth 2 (two) times daily. 60 tablet 6  . Multiple Vitamin (MULTIVITAMIN WITH MINERALS) TABS tablet Take 1 tablet by mouth 2 (two) times daily.    Marland Kitchen NOVOLIN 70/30 RELION (70-30) 100 UNIT/ML injection Inject 15-65 Units into the skin See admin instructions. Taking 65 units with breakfast and 15 units at lunch and 65 units with dinner.    . ondansetron (ZOFRAN ODT) 4 MG disintegrating tablet Take 1 tablet (4 mg total) by mouth every 8 (eight) hours as needed for nausea. 10 tablet  0  . Propylene Glycol (SYSTANE COMPLETE) 0.6 % SOLN Apply 1 drop to eye as needed (dry eyes).    . sodium chloride (OCEAN) 0.65 % SOLN nasal spray Place 1 spray into both nostrils as needed for congestion. 15 mL 0  . vitamin E 400 UNIT capsule Take 400 Units by mouth daily.     No current facility-administered medications for this visit.     Allergies  Allergen Reactions  . Atorvastatin     Myalgias   . Crestor [Rosuvastatin Calcium] Other (See Comments)    Stiffness and back pain  . Ezetimibe-Simvastatin Other (See Comments)    Leg cramps  . Celebrex [Celecoxib] Rash  . Levemir [Insulin Detemir] Rash  . Penicillins Other (See Comments)    SYNCOPE (Tolerated Ancef) Has patient had a PCN reaction causing immediate rash, facial/tongue/throat swelling, SOB or lightheadedness with hypotension: Yes Has patient had a PCN reaction causing severe rash involving mucus membranes or skin necrosis: No Has patient had a PCN reaction that required hospitalization No Has patient had a PCN reaction occurring within the last 10 years: No If all of the above answers are "NO", then may proceed with Cephalosporin use.    Social History   Socioeconomic History  .  Marital status: Married    Spouse name: Not on file  . Number of children: 3  . Years of education: some college  . Highest education level: Some college, no degree  Occupational History  . Occupation: retired    Fish farm manager: [place]  . Financial resource strain: Not hard at all  . Food insecurity:    Worry: Never true    Inability: Never true  . Transportation needs:    Medical: No    Non-medical: No  Tobacco Use  . Smoking status: Never Smoker  . Smokeless tobacco: Never Used  Substance and Sexual Activity  . Alcohol use: Yes    Alcohol/week: 0.0 oz    Comment: 02/14/2018 "mixed drink q few years"  . Drug use: Never  . Sexual activity: Yes    Birth control/protection: Post-menopausal  Lifestyle  . Physical activity:    Days per week: 0 days    Minutes per session: 0 min  . Stress: Only a little  Relationships  . Social connections:    Talks on phone: More than three times a week    Gets together: More than three times a week    Attends religious service: More than 4 times per year    Active member of club or organization: Yes    Attends meetings of clubs or organizations: More than 4 times per year    Relationship status: Married  . Intimate partner violence:    Fear of current or ex partner: No    Emotionally abused: No    Physically abused: No    Forced sexual activity: No  Other Topics Concern  . Not on file  Social History Narrative   Married   Patient is right handed.   Patient rarely drinks caffeine.   Lives at home with husband in two story home. Husband washes laundry in the basement. They have 3 daughters and 7 grandchildren.     Family History  Problem Relation Age of Onset  . Heart failure Mother   . Hypertension Mother   . Skin cancer Mother   . Colitis Mother   . Diabetes Mother   . Kidney Stones Mother   . Stroke Mother   . Heart  disease Father   . Colon polyps Father   . Diabetes Father   . Heart failure Father   .  Heart attack Sister   . Diabetes Other   . Stroke Maternal Grandfather   . Diabetes Maternal Grandfather   . Colon cancer Neg Hx     Review of Systems:  As stated in the HPI and otherwise negative.   BP (!) 160/64   Pulse 96   Ht 5\' 7"  (1.702 m)   Wt 219 lb 12.8 oz (99.7 kg)   BMI 34.43 kg/m   Physical Examination: General: Well developed, well nourished, NAD  HEENT: OP clear, mucus membranes moist  SKIN: warm, dry. No rashes. Neuro: No focal deficits  Musculoskeletal: Muscle strength 5/5 all ext  Psychiatric: Mood and affect normal  Neck: No JVD, no carotid bruits, no thyromegaly, no lymphadenopathy.  Lungs:Clear bilaterally, no wheezes, rhonci, crackles Cardiovascular: Regular rate and rhythm. Loud, harsh, late peaking systolic murmur.  Abdomen:Soft. Bowel sounds present. Non-tender.  Extremities: No lower extremity edema. Pulses are 2 + in the bilateral DP/PT.  TEE 02/16/18: - Left ventricle: Severe LVH. Small LV cavity. Systolic function   was vigorous. The estimated ejection fraction was in the range of   65% to 70%. Wall motion was normal; there were no regional wall   motion abnormalities. - Aortic valve: Calcified with restricted leaflet motion,   especially the non-coronary cusp which appears immobile. Severe   stenosis. Mean gradient (S): 32 mm Hg. Peak gradient (S): 57 mm   Hg. Valve area (VTI): 0.9 cm^2. Valve area (Vmax): 0.92 cm^2.   Valve area (Vmean): 0.7 cm^2. - Mitral valve: Mildly calcified annulus. Sclerotic leafelts,   possibly rheumatic. Mild regurgitation. - Left atrium: The atrium was dilated. No evidence of thrombus in   the atrial cavity or appendage. - Right atrium: The atrium was dilated. - Atrial septum: No defect or patent foramen ovale was identified. - Tricuspid valve: There was mild regurgitation. - Pericardium, extracardiac: Small pericardial effusion.  Impressions:  - LVEF 65-70%, severe LVH, severe aortic stenosis - mean gradient    of 32 mmHg - AVA 0.9 cm2, small pericardial effusion. Successful   DCCV to junctional rhythm.  ------------------------------------------------------------------- Study data:   Study status:  Routine.  Consent:  The risks, benefits, and alternatives to the procedure were explained to the patient and informed consent was obtained.  Procedure:  Initial setup. The patient was brought to the laboratory. Surface ECG leads were monitored. Sedation. Deep sedation was administered by anesthesiology staff. Transesophageal echocardiography. Topical anesthesia was obtained using viscous lidocaine. An adult multiplane transesophageal probe was inserted by the attending cardiologistwithout difficulty. Image quality was adequate.  Study completion:  The patient tolerated the procedure well. There were no complications.          Diagnostic transesophageal echocardiography.  2D and color Doppler.  Birthdate:  Patient birthdate: 07-Feb-1951.  Age:  Patient is 67 yr old.  Sex:  Gender: female.    BMI: 34.3 kg/m^2.  Blood pressure:     80/57  Patient status:  Inpatient.  Study date:  Study date: 02/16/2018. Study time: 01:34 PM.  Location:  Endoscopy.  -------------------------------------------------------------------  ------------------------------------------------------------------- Left ventricle:  Severe LVH. Small LV cavity. Systolic function was vigorous. The estimated ejection fraction was in the range of 65% to 70%. Wall motion was normal; there were no regional wall motion abnormalities.  ------------------------------------------------------------------- Aortic valve:  Calcified with restricted leaflet motion, especially the non-coronary cusp which appears immobile.  Severe stenosis. Doppler:     VTI ratio of LVOT to aortic valve: 0.32. Valve area (VTI): 0.9 cm^2. Indexed valve area (VTI): 0.41 cm^2/m^2. Peak velocity ratio of LVOT to aortic valve: 0.32. Valve area (Vmax): 0.92 cm^2.  Indexed valve area (Vmax): 0.42 cm^2/m^2. Mean velocity ratio of LVOT to aortic valve: 0.25. Valve area (Vmean): 0.7 cm^2. Indexed valve area (Vmean): 0.32 cm^2/m^2.    Mean gradient (S): 32 mm Hg. Peak gradient (S): 57 mm Hg.  ------------------------------------------------------------------- Mitral valve:   Mildly calcified annulus. Sclerotic leafelts, possibly rheumatic. Mild regurgitation.  ------------------------------------------------------------------- Left atrium:  The atrium was dilated.  No evidence of thrombus in the atrial cavity or appendage.  ------------------------------------------------------------------- Atrial septum:  No defect or patent foramen ovale was identified.   ------------------------------------------------------------------- Right ventricle:  Poorly visualized.  ------------------------------------------------------------------- Pulmonic valve:   Poorly visualized.  Doppler:  There was trivial regurgitation.  ------------------------------------------------------------------- Tricuspid valve:   Doppler:  There was mild regurgitation.  ------------------------------------------------------------------- Pulmonary artery:   The main pulmonary artery was normal-sized.  ------------------------------------------------------------------- Right atrium:  The atrium was dilated.  ------------------------------------------------------------------- Pericardium:  Small pericardial effusion.  ------------------------------------------------------------------- Measurements   Left ventricle                           Value  Stroke volume, 2D                        62    ml  Stroke volume/bsa, 2D                    28    ml/m^2    LVOT                                     Value  LVOT ID, S                               19    mm  LVOT area                                2.84  cm^2  LVOT peak velocity, S                    122   cm/s  LVOT mean  velocity, S                    63.4  cm/s  LVOT VTI, S                              22    cm  LVOT peak gradient, S                    6     mm Hg    Aortic valve                             Value  Aortic valve peak velocity, S            377   cm/s  Aortic valve mean velocity, S  257   cm/s  Aortic valve VTI, S                      69.4  cm  Aortic mean gradient, S                  32    mm Hg  Aortic peak gradient, S                  57    mm Hg  VTI ratio, LVOT/AV                       0.32  Aortic valve area, VTI                   0.9   cm^2  Aortic valve area/bsa, VTI               0.41  cm^2/m^2  Velocity ratio, peak, LVOT/AV            0.32  Aortic valve area, peak velocity         0.92  cm^2  Aortic valve area/bsa, peak velocity     0.42  cm^2/m^2  Velocity ratio, mean, LVOT/AV            0.25  Aortic valve area, mean velocity         0.7   cm^2  Aortic valve area/bsa, mean velocity     0.32  cm^2/m^2   EKG:  EKG is ordered today. The ekg ordered today demonstrates NSR, rate 96 bpm. ST abnormality with TWI diffusely, unchanged.   Recent Labs: 02/14/2018: TSH 0.780 02/18/2018: Magnesium 1.8 02/21/2018: ALT 18; BUN 39; Creatinine, Ser 1.68; Hemoglobin 13.4; Platelets 359; Potassium 5.3; Sodium 140    Wt Readings from Last 3 Encounters:  03/12/18 219 lb 12.8 oz (99.7 kg)  02/21/18 208 lb 2 oz (94.4 kg)  02/19/18 206 lb 6.4 oz (93.6 kg)     Other studies Reviewed: Additional studies/ records that were reviewed today include: echo images, office notes, hospital ntoes Review of the above records demonstrates: AS  Assessment and Plan:   1. Severe aortic stenosis: She has moderately severe to severe aortic valve stenosis. I have personally reviewed the echo images. The aortic valve is thickened, calcified with limited leaflet mobility. Her symptoms may be due to AS but she had a recent admission with chest pain and elevated troponin in setting of atrial fib with RVR.  She will need a cardiac cath before we plan TAVR. Given advanced age and comborbidities, she is not a good candidate for conventional AVR by surgical approach. I think she may be a good candidate for TAVR when AVR is needed.   STS Risk Score:  Procedure: Isolated AVR  Risk of Mortality: 2.614%  Renal Failure: 5.302%  Permanent Stroke: 2.024%  Prolonged Ventilation: 12.000%  DSW Infection: 0.176%  Reoperation: 2.440%  Morbidity or Mortality: 19.419%  Short Length of Stay: 22.158%  Long Length of Stay: 9.723%  I have reviewed the natural history of aortic stenosis with the patient and their family members  who are present today. We have discussed the limitations of medical therapy and the poor prognosis associated with symptomatic aortic stenosis. We have reviewed potential treatment options, including palliative medical therapy, conventional surgical aortic valve replacement, and transcatheter aortic valve replacement. We discussed treatment options in the context of the patient's specific comorbid medical conditions.  She would like to proceed with planning for TAVR. I will arrange a right and left heart catheterization at [facility] August 15th at 10:30 am. This will be over 30 days out from her cardioversion. She will hold Eliquis and Metformin two days before her cath. .Risks and benefits of the cath procedure and the TAVR procedure reviewed with the patient. After the cath, she will have a cardiac CT, CTA of the chest/abdomen and pelvis, PFTs, carotid dopplers and PT assessment and will then be referred to see one of the CT surgeons on our TAVR team.    Current medicines are reviewed at length with the patient today.  The patient does not have concerns regarding medicines.  The following changes have been made:  no change  Labs/ tests ordered today include:   Orders Placed This Encounter  Procedures  . CBC  . Basic Metabolic Panel (BMET)  . EKG 12-Lead     Disposition:   FU will be  planned with the valve team following her cath.    Signed, Lauree Chandler, MD 03/12/2018 11:27 AM    [address], [place], [place]  [address] Phone: [phone]; Fax: [phone]

## 2018-03-09 NOTE — Telephone Encounter (Signed)
Returned call to patient who reports 16lb weight gain since discharge. She states she was 222lbs yesterday and 218lbs today. Her BP is running high - 170/69 today and HR in the 70s. She does report swelling in feet. No noticeable SOB while speaking with patient. She does not take diuretic. Last EF was normal on 06/2017 echo but she was recently in hospital for AF with RVR. She does not monitor her salt/sodium intake strictly but tries to not add salt to foods. Educated patient on this. She reports she needs to see a nephrologist per her PCP but this has not been arranged. She has MD OV on 8/5.   She would like MD to review her concerns.   In reference to metformin question, she was told she may need a heart cath for pre-valve replacement work up. Explained if this is set up for her, she will be notified of all instructions. She see Dr. Angelena Form Monday 7/29 for this.

## 2018-03-09 NOTE — Telephone Encounter (Signed)
New Message:      Pt c/o swelling: STAT is pt has developed SOB within 24 hours  1) How much weight have you gained and in what time span? 16 lbs since 02/19/18:discharged from hospital  2) If swelling, where is the swelling located? Legs and feet  3) Are you currently taking a fluid pill? No  4) Are you currently SOB? When laying down  5) Do you have a log of your daily weights (if so, list)?   6) Have you gained 3 pounds in a day or 5 pounds in a week?  7) Have you traveled recently? No    Pt c/o medication issue:  1. Name of Medication: metFORMIN (GLUCOPHAGE) 1000 MG tablet  2. How are you currently taking this medication (dosage and times per day)? Take 1 Tablet by mouth 2 times a day  3. Are you having a reaction (difficulty breathing--STAT)? No  4. What is your medication issue? Pt is needing to know how long she needs to be off of this medication. She was told to stop taking it at the hosp.

## 2018-03-10 ENCOUNTER — Other Ambulatory Visit: Payer: Self-pay | Admitting: Family Medicine

## 2018-03-11 ENCOUNTER — Other Ambulatory Visit: Payer: Self-pay | Admitting: Family Medicine

## 2018-03-12 ENCOUNTER — Ambulatory Visit (INDEPENDENT_AMBULATORY_CARE_PROVIDER_SITE_OTHER): Payer: Medicare Other | Admitting: Cardiovascular Disease

## 2018-03-12 ENCOUNTER — Encounter: Payer: Self-pay | Admitting: Cardiovascular Disease

## 2018-03-12 VITALS — BP 160/64 | HR 96 | Ht 67.0 in | Wt 219.8 lb

## 2018-03-12 DIAGNOSIS — I350 Nonrheumatic aortic (valve) stenosis: Secondary | ICD-10-CM | POA: Diagnosis not present

## 2018-03-12 NOTE — Patient Instructions (Signed)
Medication Instructions:  Your physician recommends that you continue on your current medications as directed. Please refer to the Current Medication list given to you today. See catheterization instruction sheet  Labwork: Your physician recommends that you return for lab work on August 13,2019.  The lab opens at 7:30 AM.  You do not need to be fasting for lab work.    Testing/Procedures: Your physician has requested that you have a cardiac catheterization. Cardiac catheterization is used to diagnose and/or treat various heart conditions. Doctors may recommend this procedure for a number of different reasons. The most common reason is to evaluate chest pain. Chest pain can be a symptom of coronary artery disease (CAD), and cardiac catheterization can show whether plaque is narrowing or blocking your heart's arteries. This procedure is also used to evaluate the valves, as well as measure the blood flow and oxygen levels in different parts of your heart. For further information please visit [email or web address]. Please follow instruction sheet, as given.  Scheduled for August 15,2019  Follow-Up: Follow up with Dr. Percival Spanish as planned.   Any Other Special Instructions Will Be Listed Below (If Applicable).     [facility] [address], [address] [place] [address] Dept: [phone] Loc: [phone]  [name]  03/12/2018  You are scheduled for a Cardiac Catheterization on Thursday, August 15 with Dr. Lauree Chandler.  1. Please arrive at the [facility] (Main Entrance A) at [facility]: [address] [place], [place] [address] at 6:30 AM (This time is 4 hours before your procedure to ensure your preparation). Free valet parking service is available.   Special note: Every effort is made to have your procedure done on time. Please understand that emergencies sometimes delay  scheduled procedures.  2. Diet: Do not eat solid foods after midnight.  The patient may have clear liquids until 5am upon the day of the procedure.  3. Labs: You will need to have blood drawn on Tuesday, August 13 at [facility] at [facility]. [address]  Open: 7:30am - 5pm    Phone: [phone]. You do not need to be fasting.  4. Medication instructions in preparation for your procedure:   Contrast Allergy: No   Stop Eliquis after evening dose on 8/12. Do not take metformin the day of procedure and for 48 hours after procedure.  Take half your normal insulin dose the evening before the procedure.  Do not take any insulin or diabetes medication the morning of the procedure.     On the morning of your procedure, take   Aspirin 81 mg and any morning medicines NOT listed above.  You may use sips of water.  5. Plan for one night stay--bring personal belongings. 6. Bring a current list of your medications and current insurance cards. 7. You MUST have a responsible person to drive you home. 8. Someone MUST be with you the first 24 hours after you arrive home or your discharge will be delayed. 9. Please wear clothes that are easy to get on and off and wear slip-on shoes.  Thank you for allowing [place] to care for you!   -- [facility]    If you need a refill on your cardiac medications before your next appointment, please call your pharmacy.

## 2018-03-13 ENCOUNTER — Encounter: Payer: Self-pay | Admitting: Family Medicine

## 2018-03-13 ENCOUNTER — Ambulatory Visit (INDEPENDENT_AMBULATORY_CARE_PROVIDER_SITE_OTHER): Payer: Medicare Other

## 2018-03-13 ENCOUNTER — Ambulatory Visit (INDEPENDENT_AMBULATORY_CARE_PROVIDER_SITE_OTHER): Payer: Medicare Other | Admitting: Family Medicine

## 2018-03-13 VITALS — BP 175/80 | HR 95 | Temp 98.2°F | Ht 67.0 in | Wt 217.0 lb

## 2018-03-13 DIAGNOSIS — R0602 Shortness of breath: Secondary | ICD-10-CM

## 2018-03-13 DIAGNOSIS — J90 Pleural effusion, not elsewhere classified: Secondary | ICD-10-CM | POA: Diagnosis not present

## 2018-03-13 NOTE — Telephone Encounter (Signed)
Tried calling patient, no answer.

## 2018-03-13 NOTE — Progress Notes (Signed)
Subjective: CC: shortness of breath PCP: Chipper Herb, MD [name] is a 67 y.o. female presenting to clinic today for:  1. Shortness of breath Patient reports a several day history of shortness of breath with lying down.  She denies any increased dyspnea on exertion.  She reports an intermittent cough that is nonproductive is present.  Denies any hemoptysis, fevers, pleuritic chest pain.  She notes that she was evaluated by her cardiologist yesterday who thought she may have persistent pneumonia and was referred here for evaluation.  She notes occasional lower extremity edema but nothing that seems increased from baseline.  She has an appointment with cardiology in August as well.  I reviewed her last cardiology note who states that she has severe aortic stenosis with plans for catheterization prior to TAVR.  Last echocardiogram reviewed which did demonstrate preserved systolic function with an ejection fraction of 65 to 70%.  Severe LVH and severe aortic stenosis noted.   ROS: Per HPI  Allergies  Allergen Reactions  . Atorvastatin     Myalgias   . Crestor [Rosuvastatin Calcium] Other (See Comments)    Stiffness and back pain  . Ezetimibe-Simvastatin Other (See Comments)    Leg cramps  . Celebrex [Celecoxib] Rash  . Levemir [Insulin Detemir] Rash  . Penicillins Other (See Comments)    SYNCOPE (Tolerated Ancef) Has patient had a PCN reaction causing immediate rash, facial/tongue/throat swelling, SOB or lightheadedness with hypotension: Yes Has patient had a PCN reaction causing severe rash involving mucus membranes or skin necrosis: No Has patient had a PCN reaction that required hospitalization No Has patient had a PCN reaction occurring within the last 10 years: No If all of the above answers are "NO", then may proceed with Cephalosporin use.   Past Medical History:  Diagnosis Date  . Abnormal Doppler ultrasound of carotid artery    a. 05/2013 - mild plaque,  1-39% BICA, abnormal appearrance of thyroid (TSH wnl in 06/2013).  Marland Kitchen CAD (coronary artery disease)    a. Nonobstructive by cath 2006 - 25% LAD, 25-30% after 1st diag, distal 25% PDA, minor irreg LCx. b. Normal nuc 2011.  . CKD (chronic kidney disease), stage III ([place])   . Cystocele   . Difficult intubation 09-07-2012   big neck trouble with intubation parotid surgery"takes little med to sedate"  . Dyslipidemia   . Esophageal stricture   . GERD (gastroesophageal reflux disease)   . Heart murmur   . Hiatal hernia    a. 07/2013: HH with small stricture holding up barium tablet (concides with patient's sx of food sticking).  . History of kidney stones   . Hyperlipidemia   . Hypertension    dr Percival Spanish  . IBS (irritable bowel syndrome)   . Memory difficulty 11/26/2015  . Morbid obesity ([place])   . Osteoarthritis    "all over" (02/14/2018)  . Osteopenia   . Osteoporosis   . Palpitations    a. PVC/PAC on EKG 08/2013.  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  Marland Kitchen Pericardial effusion    a. Mod by echo 2012 at time of PNA. b. Echo 07/2012: small pericardial effusion vs fat.  Marland Kitchen PONV (postoperative nausea and vomiting)    n/v, and room spinning after cataract and parotid surgery  . Sleep apnea     "have mask; don't use it" (02/14/2018)  . Superficial phlebitis   . TIA (transient ischemic attack) 10/2014; 06/2016   "some memory issues  since; daughter says my speech is sometimes different" (02/14/2018)  . Type II diabetes mellitus ([place])     Current Outpatient Medications:  .  ACCU-CHEK AVIVA PLUS test strip, USE TO CHECK BLOOD SUGAR UP TO TWICE DAILY OR AS INSTRUCTED, Disp: 100 each, Rfl: 2 .  amiodarone (PACERONE) 200 MG tablet, Take 1 tablet (200 mg total) by mouth daily., Disp: 30 tablet, Rfl: 0 .  apixaban (ELIQUIS) 5 MG TABS tablet, Take 1 tablet (5 mg total) by mouth 2 (two) times daily., Disp: 60 tablet, Rfl: 11 .  B-D INS SYR ULTRAFINE 1CC/31G 31G X 5/16" 1 ML MISC, USE  TO INJECT INSULIN TWICE A DAY AS INSTRUCTED, Disp: 100 each, Rfl: 2 .  Calcium Carbonate-Vitamin D (CALCIUM 600+D) 600-400 MG-UNIT per tablet, Take 1 tablet by mouth 2 (two) times daily., Disp: , Rfl:  .  cholecalciferol (VITAMIN D) 1000 UNITS tablet, Take 1,000 Units by mouth daily. , Disp: , Rfl:  .  Insulin Pen Needle 32G X 4 MM MISC, Use to inject insulin with insulin pen, Disp: 100 each, Rfl: 4 .  levofloxacin (LEVAQUIN) 500 MG tablet, Take 1 tablet (500 mg total) by mouth daily., Disp: 6 tablet, Rfl: 0 .  LIVALO 4 MG TABS, TAKE 1 TABLET BY MOUTH ONCE DAILY, Disp: 90 tablet, Rfl: 1 .  meclizine (ANTIVERT) 12.5 MG tablet, Take 1 tablet (12.5 mg total) by mouth 3 (three) times daily as needed for dizziness., Disp: 30 tablet, Rfl: 1 .  metFORMIN (GLUCOPHAGE) 1000 MG tablet, TAKE 1 TABLET BY MOUTH 2 TIMES A DAY, Disp: 180 tablet, Rfl: 0 .  metoprolol tartrate (LOPRESSOR) 25 MG tablet, Take 1 tablet (25 mg total) by mouth 2 (two) times daily., Disp: 60 tablet, Rfl: 6 .  Multiple Vitamin (MULTIVITAMIN WITH MINERALS) TABS tablet, Take 1 tablet by mouth 2 (two) times daily., Disp: , Rfl:  .  NOVOLIN 70/30 RELION (70-30) 100 UNIT/ML injection, Inject 15-65 Units into the skin See admin instructions. Taking 65 units with breakfast and 15 units at lunch and 65 units with dinner., Disp: , Rfl:  .  ondansetron (ZOFRAN ODT) 4 MG disintegrating tablet, Take 1 tablet (4 mg total) by mouth every 8 (eight) hours as needed for nausea., Disp: 10 tablet, Rfl: 0 .  Propylene Glycol (SYSTANE COMPLETE) 0.6 % SOLN, Apply 1 drop to eye as needed (dry eyes)., Disp: , Rfl:  .  sodium chloride (OCEAN) 0.65 % SOLN nasal spray, Place 1 spray into both nostrils as needed for congestion., Disp: 15 mL, Rfl: 0 .  vitamin E 400 UNIT capsule, Take 400 Units by mouth daily., Disp: , Rfl:  Social History   Socioeconomic History  . Marital status: Married    Spouse name: Not on file  . Number of children: 3  . Years of education:  some college  . Highest education level: Some college, no degree  Occupational History  . Occupation: retired    Fish farm manager: [employer]  . Financial resource strain: Not hard at all  . Food insecurity:    Worry: Never true    Inability: Never true  . Transportation needs:    Medical: No    Non-medical: No  Tobacco Use  . Smoking status: Never Smoker  . Smokeless tobacco: Never Used  Substance and Sexual Activity  . Alcohol use: Yes    Alcohol/week: 0.0 oz    Comment: 02/14/2018 "mixed drink q few years"  . Drug use: Never  . Sexual activity:  Yes    Birth control/protection: Post-menopausal  Lifestyle  . Physical activity:    Days per week: 0 days    Minutes per session: 0 min  . Stress: Only a little  Relationships  . Social connections:    Talks on phone: More than three times a week    Gets together: More than three times a week    Attends religious service: More than 4 times per year    Active member of club or organization: Yes    Attends meetings of clubs or organizations: More than 4 times per year    Relationship status: Married  . Intimate partner violence:    Fear of current or ex partner: No    Emotionally abused: No    Physically abused: No    Forced sexual activity: No  Other Topics Concern  . Not on file  Social History Narrative   Married   Patient is right handed.   Patient rarely drinks caffeine.   Lives at home with husband in two story home. Husband washes laundry in the basement. They have 3 daughters and 7 grandchildren.    Family History  Problem Relation Age of Onset  . Heart failure Mother   . Hypertension Mother   . Skin cancer Mother   . Colitis Mother   . Diabetes Mother   . Kidney Stones Mother   . Stroke Mother   . Heart disease Father   . Colon polyps Father   . Diabetes Father   . Heart failure Father   . Heart attack Sister   . Diabetes Other   . Stroke Maternal Grandfather   . Diabetes Maternal Grandfather    . Colon cancer Neg Hx     Objective: Office vital signs reviewed. BP (!) 175/80   Pulse 95   Temp 98.2 F (36.8 C) (Oral)   Ht 5\' 7"  (1.702 m)   Wt 217 lb (98.4 kg)   SpO2 95%   BMI 33.99 kg/m   Physical Examination:  General: Awake, alert, obese, No acute distress HEENT: Normal, no JVD noted Cardio: regular rate and rhythm, S1S2 heard, Harsh SEM appreciated at LSB Pulm: Mild crackle appreciated in right base of lung, no wheezes, rhonchi; normal work of breathing on room air GI: Central adiposity noted. Extremities: warm, well perfused, minimal pedal edema.  No cyanosis or clubbing  Dg Chest 2 View  Result Date: 03/13/2018 CLINICAL DATA:  SOB EXAM: CHEST - 2 VIEW COMPARISON:  02/29/2016 FINDINGS: Moderate cardiomegaly.  Aortic Atherosclerosis (ICD10-170.0). Subtle septal lines are noted peripherally in the lung bases. No focal airspace disease. Blunting of costophrenic angles suggesting small effusions. No pneumothorax. Anterior vertebral endplate spurring at multiple levels in the mid and lower thoracic spine. IMPRESSION: 1. Cardiomegaly with small bilateral pleural effusions and bilateral septal lines suggesting early/mild CHF. Electronically Signed   By: Lucrezia Europe M.D.   On: 03/13/2018 17:00   Dg Chest 2 View  Result Date: 02/18/2018 CLINICAL DATA:  Cough and wheezing EXAM: CHEST - 2 VIEW COMPARISON:  February 14, 2018 FINDINGS: There is airspace consolidation throughout much of the mid and lower lung zones on the right with right pleural effusion. Left lung is clear. Heart is upper normal in size with pulmonary vascularity normal. There is aortic atherosclerosis. No adenopathy. There is degenerative change in the thoracic spine. IMPRESSION: Multifocal airspace consolidation consistent with pneumonia on the right. Fairly small right pleural effusion. Left lung clear. Heart upper normal in size. Aortic atherosclerosis.  Aortic Atherosclerosis (ICD10-I70.0). Electronically Signed   By:  Lowella Grip III M.D.   On: 02/18/2018 13:42   Dg Chest Port 1 View  Result Date: 02/14/2018 CLINICAL DATA:  Chest pain with palpitations EXAM: PORTABLE CHEST 1 VIEW COMPARISON:  Jan 08, 2014 FINDINGS: There is no edema or consolidation. Heart is mildly enlarged with pulmonary vascularity normal. No adenopathy. There is aortic atherosclerosis. No evident bone lesions. No pneumothorax. IMPRESSION: No edema or consolidation. Mild cardiac enlargement. There is aortic atherosclerosis. Aortic Atherosclerosis (ICD10-I70.0). Electronically Signed   By: Lowella Grip III M.D.   On: 02/14/2018 11:54   Assessment/ Plan: 67 y.o. female   1. Shortness of breath Patient with normal work of breathing and normal oxygen saturation on room air.  She does have mild crackles in the right base and for this reason chest x-ray was obtained.  Results is notable for small bilateral pleural effusions which were noted on previous chest x-rays.  However, there is also bilateral septal lines suggestive of early or mild CHF.  We discussed consideration for Lasix for a couple of days but patient declined this, citing that "her daughter would not like that".  She does have known renal disease and actually has been referred to nephrology in [place] for this.  However, we discussed risks versus benefits of Lasix use.  Patient seems adamant about not using a diuretic.  She seems well-informed.  We discussed making sure that she substantially reduce salt in the diet to reduce fluid overload.  She has follow-up with cardiology in the next couple of days.  We will recheck her BMP since she was hyperkalemic last check.  We will also check BNP since none in chart for comparison.  Reasons for emergent evaluation the emergency department discussed.  Patient and has been voiced good understanding.  A copy of her chest x-ray was provided to her. - DG Chest 2 View; Future - Basic Metabolic Panel - Brain natriuretic peptide   Orders  Placed This Encounter  Procedures  . DG Chest 2 View    Standing Status:   Future    Standing Expiration Date:   05/15/2019    Order Specific Question:   Reason for Exam (SYMPTOM  OR DIAGNOSIS REQUIRED)    Answer:   orthopnea, shortness of breath    Order Specific Question:   Preferred imaging location?    Answer:   Internal    Order Specific Question:   Radiology Contrast Protocol - do NOT remove file path    Answer:   [email or web address]      [name], [place] [phone]

## 2018-03-13 NOTE — Patient Instructions (Addendum)
You had labs performed today.  You will be contacted with the results of the labs once they are available, usually in the next 3 business days for routine lab work.    Shortness of Breath, Adult Shortness of breath means you have trouble breathing. Your lungs are organs for breathing. Follow these instructions at home: Pay attention to any changes in your symptoms. Take these actions to help with your condition:  Do not smoke. Smoking can cause shortness of breath. If you need help to quit smoking, ask your doctor.  Avoid things that can make it harder to breathe, such as: ? Mold. ? Dust. ? Air pollution. ? Chemical smells. ? Things that can cause allergy symptoms (allergens), if you have allergies.  Keep your living space clean and free of mold and dust.  Rest as needed. Slowly return to your usual activities.  Take over-the-counter and prescription medicines, including oxygen and inhaled medicines, only as told by your doctor.  Keep all follow-up visits as told by your doctor. This is important.  Contact a doctor if:  Your condition does not get better as soon as expected.  You have a hard time doing your normal activities, even after you rest.  You have new symptoms. Get help right away if:  You have trouble breathing when you are resting.  You feel light-headed or you faint.  You have a cough that is not helped by medicines.  You cough up blood.  You have pain with breathing.  You have pain in your chest, arms, shoulders, or belly (abdomen).  You have a fever.  You cannot walk up stairs.  You cannot exercise the way you normally do. This information is not intended to replace advice given to you by your health care provider. Make sure you discuss any questions you have with your health care provider. Document Released: 01/18/2008 Document Revised: 08/18/2016 Document Reviewed: 08/18/2016 Elsevier Interactive Patient Education  2017 [employer].

## 2018-03-13 NOTE — Telephone Encounter (Signed)
It looks like the patient saw Dr. Angelena Form yesterday and is seeing the PCP today.  Please verify that her issues have been addressed at these appts.

## 2018-03-14 ENCOUNTER — Other Ambulatory Visit: Payer: Self-pay | Admitting: Family Medicine

## 2018-03-14 DIAGNOSIS — R7989 Other specified abnormal findings of blood chemistry: Secondary | ICD-10-CM

## 2018-03-14 LAB — BASIC METABOLIC PANEL
BUN/Creatinine Ratio: 23 (ref 12–28)
BUN: 34 mg/dL — ABNORMAL HIGH (ref 8–27)
CO2: 23 mmol/L (ref 20–29)
Calcium: 9.2 mg/dL (ref 8.7–10.3)
Chloride: 104 mmol/L (ref 96–106)
Creatinine, Ser: 1.49 mg/dL — ABNORMAL HIGH (ref 0.57–1.00)
GFR calc Af Amer: 42 mL/min/{1.73_m2} — ABNORMAL LOW (ref >59)
GFR calc non Af Amer: 36 mL/min/{1.73_m2} — ABNORMAL LOW (ref >59)
Glucose: 263 mg/dL — ABNORMAL HIGH (ref 65–99)
Potassium: 5.2 mmol/L (ref 3.5–5.2)
Sodium: 143 mmol/L (ref 134–144)

## 2018-03-14 LAB — BRAIN NATRIURETIC PEPTIDE: BNP: 163.3 pg/mL — ABNORMAL HIGH (ref 0.0–100.0)

## 2018-03-14 MED ORDER — FUROSEMIDE 20 MG PO TABS: 20.0000 mg | ORAL_TABLET | Freq: Every day | ORAL | 0 refills | Status: DC

## 2018-03-14 NOTE — Telephone Encounter (Signed)
Called patient. She reports PCP office did CXR - no pneumonia but "pockets" of effusion. She did not get any medications for this. She reiterated that she needs a nephrology appointment. She reports her weight has been coming down. She has no swelling. Her BP was not addressed. She has not checked it today. Advised that she monitor BP at least twice daily until 8/5 visit with Dr. Percival Spanish and bring her log of readings to this appointment.

## 2018-03-14 NOTE — Progress Notes (Signed)
Lasix 20mg  daily x3 days sent to pharmacy.  Come in Monday for recheck BMP.  Follow up with Cardiology and Dr Laurance Flatten as directed.

## 2018-03-14 NOTE — Telephone Encounter (Addendum)
Per chart review, BP was not addressed at visit on 03/12/18. She had a PCP visit as well on 03/13/18 to address SOB (in Epic). She has MD OV 03/19/18

## 2018-03-14 NOTE — Progress Notes (Signed)
Patient is aware and will begin Lasix.  She has an appointment with her cardiologist on Monday and will come by here to have lab work done also.

## 2018-03-14 NOTE — Progress Notes (Signed)
I sent in 3 days of Lasix 20 mg for patient to take daily for elevated BNP and what appears to be early CHF on chest x-ray at last visit.  We will attempt gentle diuresis given concomitant renal disease.  Plan for recheck of BMP on Monday.  She has an appointment with cardiology and was instructed to follow-up with PCP as needed ongoing needs.  [name] M. Lajuana Ripple, [facility]

## 2018-03-15 ENCOUNTER — Other Ambulatory Visit: Payer: Self-pay | Admitting: Physician Assistant

## 2018-03-15 NOTE — Progress Notes (Signed)
It was scheduled with patient yesterday

## 2018-03-15 NOTE — Telephone Encounter (Signed)
Dr. Percival Spanish is the primary Cardiologist. Please address

## 2018-03-18 ENCOUNTER — Encounter: Payer: Self-pay | Admitting: Cardiology

## 2018-03-18 NOTE — Progress Notes (Signed)
HPI The patient has follow up of AS.  She had non-obstructive CAD by cath in 2006. She was admitted to [facility] 02/14/18 with c/o chest pain and was found to have atrial fibrillation/flutter. She underwent TEE guided DCCV and converted to sinus. Her troponin was found to be elevated, up to 7.1. Cath was deferred given the fact that she was fully anti-coagulated before and after her cardioversion. TEE showed normal LV systolic function. The aortic valve was calcified with limited leaflet mobility. Mean gradient 32 mmHg, peak gradient 57 mmHg, AVA 0.92 cm2, DVI 0.32. She has been on Eliquis following TEE guided DCCV on 02/16/18.   She saw Dr. Angelena Form in July .  She is scheduled for right and left heart cath.   The patient has had increased breath.  She was given 3 days of Lasix 20 mg and said the swelling that she had and shortness of breath seems to be removed.  Of note she was sent home on Norvasc but she did not take this because it caused swelling in the past.  Her BPs have been elevated with systolics in the 144R to 154M..      Allergies  Allergen Reactions  . Atorvastatin     Myalgias   . Crestor [Rosuvastatin Calcium] Other (See Comments)    Stiffness and back pain  . Ezetimibe-Simvastatin Other (See Comments)    Leg cramps  . Celebrex [Celecoxib] Rash  . Levemir [Insulin Detemir] Rash  . Penicillins Other (See Comments)    SYNCOPE (Tolerated Ancef) Has patient had a PCN reaction causing immediate rash, facial/tongue/throat swelling, SOB or lightheadedness with hypotension: Yes Has patient had a PCN reaction causing severe rash involving mucus membranes or skin necrosis: No Has patient had a PCN reaction that required hospitalization No Has patient had a PCN reaction occurring within the last 10 years: No If all of the above answers are "NO", then may proceed with Cephalosporin use.    Current Outpatient Medications  Medication Sig Dispense Refill  . ACCU-CHEK AVIVA PLUS test strip  USE TO CHECK BLOOD SUGAR UP TO TWICE DAILY OR AS INSTRUCTED 100 each 2  . amiodarone (PACERONE) 200 MG tablet Take 1 tablet (200 mg total) by mouth daily. 90 tablet 3  . apixaban (ELIQUIS) 5 MG TABS tablet Take 1 tablet (5 mg total) by mouth 2 (two) times daily. 180 tablet 3  . B-D INS SYR ULTRAFINE 1CC/31G 31G X 5/16" 1 ML MISC USE TO INJECT INSULIN TWICE A DAY AS INSTRUCTED 100 each 2  . Calcium Carbonate-Vitamin D (CALCIUM 600+D) 600-400 MG-UNIT per tablet Take 1 tablet by mouth 2 (two) times daily.    . cholecalciferol (VITAMIN D) 1000 UNITS tablet Take 1,000 Units by mouth daily.     . Insulin Pen Needle 32G X 4 MM MISC Use to inject insulin with insulin pen 100 each 4  . LIVALO 4 MG TABS TAKE 1 TABLET BY MOUTH ONCE DAILY 90 tablet 1  . meclizine (ANTIVERT) 12.5 MG tablet Take 1 tablet (12.5 mg total) by mouth 3 (three) times daily as needed for dizziness. 30 tablet 1  . metFORMIN (GLUCOPHAGE) 1000 MG tablet TAKE 1 TABLET BY MOUTH 2 TIMES A DAY 180 tablet 0  . metoprolol tartrate (LOPRESSOR) 25 MG tablet Take 1 tablet (25 mg total) by mouth 2 (two) times daily. 60 tablet 6  . Multiple Vitamin (MULTIVITAMIN WITH MINERALS) TABS tablet Take 1 tablet by mouth 2 (two) times daily.    Marland Kitchen  NOVOLIN 70/30 RELION (70-30) 100 UNIT/ML injection Inject 15-65 Units into the skin See admin instructions. Taking 65 units with breakfast and 15 units at lunch and 65 units with dinner.    . ondansetron (ZOFRAN ODT) 4 MG disintegrating tablet Take 1 tablet (4 mg total) by mouth every 8 (eight) hours as needed for nausea. 10 tablet 0  . Propylene Glycol (SYSTANE COMPLETE) 0.6 % SOLN Apply 1 drop to eye as needed (dry eyes).    . sodium chloride (OCEAN) 0.65 % SOLN nasal spray Place 1 spray into both nostrils as needed for congestion. 15 mL 0  . vitamin E 400 UNIT capsule Take 400 Units by mouth daily.    Marland Kitchen losartan (COZAAR) 25 MG tablet Take 1 tablet (25 mg total) by mouth daily. 90 tablet 3   No current  facility-administered medications for this visit.     Past Medical History:  Diagnosis Date  . Abnormal Doppler ultrasound of carotid artery    a. 05/2013 - mild plaque, 1-39% BICA, abnormal appearrance of thyroid (TSH wnl in 06/2013).  Marland Kitchen CAD (coronary artery disease)    a. Nonobstructive by cath 2006 - 25% LAD, 25-30% after 1st diag, distal 25% PDA, minor irreg LCx. b. Normal nuc 2011.  . CKD (chronic kidney disease), stage III ([place])   . Cystocele   . Difficult intubation 09-07-2012   big neck trouble with intubation parotid surgery"takes little med to sedate"  . Dyslipidemia   . Esophageal stricture   . GERD (gastroesophageal reflux disease)   . Hiatal hernia    a. 07/2013: HH with small stricture holding up barium tablet (concides with patient's sx of food sticking).  . History of kidney stones   . Hyperlipidemia   . Hypertension    dr Percival Spanish  . IBS (irritable bowel syndrome)   . Memory difficulty 11/26/2015  . Morbid obesity ([place])   . Osteoarthritis    "all over" (02/14/2018)  . Osteopenia   . Osteoporosis   . Palpitations    a. PVC/PAC on EKG 08/2013.  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  Marland Kitchen Pericardial effusion    a. Mod by echo 2012 at time of PNA. b. Echo 07/2012: small pericardial effusion vs fat.  Marland Kitchen PONV (postoperative nausea and vomiting)    n/v, and room spinning after cataract and parotid surgery  . Sleep apnea     "have mask; don't use it" (02/14/2018)  . Superficial phlebitis   . TIA (transient ischemic attack) 10/2014; 06/2016   "some memory issues since; daughter says my speech is sometimes different" (02/14/2018)  . Type II diabetes mellitus ([place])     Past Surgical History:  Procedure Laterality Date  . BREATH TEK H PYLORI N/A 07/29/2013   Procedure: [place];  Surgeon: Shann Medal, MD;  Location: [facility];  Service: General;  Laterality: N/A;  . CARDIAC CATHETERIZATION  2006  . CARDIOVERSION N/A 02/16/2018    Procedure: CARDIOVERSION;  Surgeon: Pixie Casino, MD;  Location: [place];  Service: Cardiovascular;  Laterality: N/A;  . CARPAL TUNNEL RELEASE Left 2011  . CATARACT EXTRACTION W/ INTRAOCULAR LENS  IMPLANT, BILATERAL Bilateral 2003   left  . CHOLECYSTECTOMY OPEN  1982  . COLONOSCOPY N/A 07/14/2015   Procedure: COLONOSCOPY;  Surgeon: Ladene Artist, MD;  Location: [facility];  Service: Endoscopy;  Laterality: N/A;  . ESOPHAGOGASTRODUODENOSCOPY N/A 12/27/2013   Procedure: ESOPHAGOGASTRODUODENOSCOPY (EGD);  Surgeon: Shann Medal, MD;  Location: [facility];  Service: General;  Laterality: N/A;  . ESOPHAGOGASTRODUODENOSCOPY (EGD) WITH ESOPHAGEAL DILATION    . LAPAROSCOPIC GASTRIC SLEEVE RESECTION N/A 01/20/2014   Procedure: LAPAROSCOPIC GASTRIC SLEEVE RESECTION;  Surgeon: Shann Medal, MD;  Location: [facility];  Service: General;  Laterality: N/A;  . PAROTIDECTOMY  09/07/2012   Procedure: PAROTIDECTOMY;  Surgeon: Melida Quitter, MD;  Location: [place];  Service: ENT;  Laterality: Left;  LEFT PAROTIDECTOMY  . TEE WITHOUT CARDIOVERSION N/A 02/16/2018   Procedure: TRANSESOPHAGEAL ECHOCARDIOGRAM (TEE);  Surgeon: Pixie Casino, MD;  Location[facility];  Service: Cardiovascular;  Laterality: N/A;  . TOE AMPUTATION Left 07/2017   great toe; [facility]   . TONSILLECTOMY  1968  . TUBAL LIGATION  yrs ago  . UPPER GI ENDOSCOPY  01/20/2014   Procedure: UPPER GI ENDOSCOPY;  Surgeon: Shann Medal, MD;  Location: [facility];  Service: General;;    ROS: As stated in the HPI and negative for all other systems.   PHYSICAL EXAM BP (!) 155/61   Pulse (!) 55   Ht 5' 7.5" (1.715 m)   Wt 212 lb 12.8 oz (96.5 kg)   BMI 32.84 kg/m   GENERAL:  Well appearing NECK:  No jugular venous distention, waveform within normal limits, carotid upstroke brisk and symmetric, no bruits, no thyromegaly LUNGS:  Clear to auscultation bilaterally CHEST:  Unremarkable HEART:  PMI not displaced or sustained,S1 and S2 within  normal limits, no S3, no S4, no clicks, no rubs, 3 of 6 apical systolic murmur radiating at the aortic outflow tract, no diastolic murmurs ABD:  Flat, positive bowel sounds normal in frequency in pitch, no bruits, no rebound, no guarding, no midline pulsatile mass, no hepatomegaly, no splenomegaly EXT:  2 plus pulses upper and absent DP/PT, no edema, no cyanosis no clubbing, missing left great toe.      NA  ASSESSMENT AND PLAN  ATRIAL FIB - She says she is had no recurrence of this.  She is tolerating anticoagulation.  Ms. Anagabriela Jokerst Mccl[place]y has a CHA2DS2 - VASc score of 3.    AORTIC STENOSIS - She is scheduled next week for heart cath and is being considered for possible TAVR.  I will defer to Dr. Angelena Form.  HYPERTENSION -  Her BP is not controlled and she did not want to take Norvasc because of previous swelling.  I will restart Cozaar which was held in the I do note that this was held in the hospital because of mild hyperkalemia foci on this and will check it.  She is to hold the Cozaar the day before and the day of catheterization.  DM - A1c is still not controlled.  She is seeing Dr. Chalmers Cater.    Obesity -  She had lost 50 lbs and understands need for continued diet and exercise.   CAROTID STENOSIS - I will check a Doppler.  This was ordered in the past by neurology and I don't see that this was done.   PERICARDIAL EFFUSION - There is no significant effusion.   PALPITATIONS - She is not bothered by these.  No change in therapy.

## 2018-03-18 NOTE — H&P (View-Only) (Signed)
HPI The patient has follow up of AS.  She had non-obstructive CAD by cath in 2006. She was admitted to [facility] 02/14/18 with c/o chest pain and was found to have atrial fibrillation/flutter. She underwent TEE guided DCCV and converted to sinus. Her troponin was found to be elevated, up to 7.1. Cath was deferred given the fact that she was fully anti-coagulated before and after her cardioversion. TEE showed normal LV systolic function. The aortic valve was calcified with limited leaflet mobility. Mean gradient 32 mmHg, peak gradient 57 mmHg, AVA 0.92 cm2, DVI 0.32. She has been on Eliquis following TEE guided DCCV on 02/16/18.   She saw Dr. Angelena Form in July .  She is scheduled for right and left heart cath.   The patient has had increased breath.  She was given 3 days of Lasix 20 mg and said the swelling that she had and shortness of breath seems to be removed.  Of note she was sent home on Norvasc but she did not take this because it caused swelling in the past.  Her BPs have been elevated with systolics in the 606T to 016W..      Allergies  Allergen Reactions  . Atorvastatin     Myalgias   . Crestor [Rosuvastatin Calcium] Other (See Comments)    Stiffness and back pain  . Ezetimibe-Simvastatin Other (See Comments)    Leg cramps  . Celebrex [Celecoxib] Rash  . Levemir [Insulin Detemir] Rash  . Penicillins Other (See Comments)    SYNCOPE (Tolerated Ancef) Has patient had a PCN reaction causing immediate rash, facial/tongue/throat swelling, SOB or lightheadedness with hypotension: Yes Has patient had a PCN reaction causing severe rash involving mucus membranes or skin necrosis: No Has patient had a PCN reaction that required hospitalization No Has patient had a PCN reaction occurring within the last 10 years: No If all of the above answers are "NO", then may proceed with Cephalosporin use.    Current Outpatient Medications  Medication Sig Dispense Refill  . ACCU-CHEK AVIVA PLUS test strip  USE TO CHECK BLOOD SUGAR UP TO TWICE DAILY OR AS INSTRUCTED 100 each 2  . amiodarone (PACERONE) 200 MG tablet Take 1 tablet (200 mg total) by mouth daily. 90 tablet 3  . apixaban (ELIQUIS) 5 MG TABS tablet Take 1 tablet (5 mg total) by mouth 2 (two) times daily. 180 tablet 3  . B-D INS SYR ULTRAFINE 1CC/31G 31G X 5/16" 1 ML MISC USE TO INJECT INSULIN TWICE A DAY AS INSTRUCTED 100 each 2  . Calcium Carbonate-Vitamin D (CALCIUM 600+D) 600-400 MG-UNIT per tablet Take 1 tablet by mouth 2 (two) times daily.    . cholecalciferol (VITAMIN D) 1000 UNITS tablet Take 1,000 Units by mouth daily.     . Insulin Pen Needle 32G X 4 MM MISC Use to inject insulin with insulin pen 100 each 4  . LIVALO 4 MG TABS TAKE 1 TABLET BY MOUTH ONCE DAILY 90 tablet 1  . meclizine (ANTIVERT) 12.5 MG tablet Take 1 tablet (12.5 mg total) by mouth 3 (three) times daily as needed for dizziness. 30 tablet 1  . metFORMIN (GLUCOPHAGE) 1000 MG tablet TAKE 1 TABLET BY MOUTH 2 TIMES A DAY 180 tablet 0  . metoprolol tartrate (LOPRESSOR) 25 MG tablet Take 1 tablet (25 mg total) by mouth 2 (two) times daily. 60 tablet 6  . Multiple Vitamin (MULTIVITAMIN WITH MINERALS) TABS tablet Take 1 tablet by mouth 2 (two) times daily.    Marland Kitchen  NOVOLIN 70/30 RELION (70-30) 100 UNIT/ML injection Inject 15-65 Units into the skin See admin instructions. Taking 65 units with breakfast and 15 units at lunch and 65 units with dinner.    . ondansetron (ZOFRAN ODT) 4 MG disintegrating tablet Take 1 tablet (4 mg total) by mouth every 8 (eight) hours as needed for nausea. 10 tablet 0  . Propylene Glycol (SYSTANE COMPLETE) 0.6 % SOLN Apply 1 drop to eye as needed (dry eyes).    . sodium chloride (OCEAN) 0.65 % SOLN nasal spray Place 1 spray into both nostrils as needed for congestion. 15 mL 0  . vitamin E 400 UNIT capsule Take 400 Units by mouth daily.    Marland Kitchen losartan (COZAAR) 25 MG tablet Take 1 tablet (25 mg total) by mouth daily. 90 tablet 3   No current  facility-administered medications for this visit.     Past Medical History:  Diagnosis Date  . Abnormal Doppler ultrasound of carotid artery    a. 05/2013 - mild plaque, 1-39% BICA, abnormal appearrance of thyroid (TSH wnl in 06/2013).  Marland Kitchen CAD (coronary artery disease)    a. Nonobstructive by cath 2006 - 25% LAD, 25-30% after 1st diag, distal 25% PDA, minor irreg LCx. b. Normal nuc 2011.  . CKD (chronic kidney disease), stage III ([place])   . Cystocele   . Difficult intubation 09-07-2012   big neck trouble with intubation parotid surgery"takes little med to sedate"  . Dyslipidemia   . Esophageal stricture   . GERD (gastroesophageal reflux disease)   . Hiatal hernia    a. 07/2013: HH with small stricture holding up barium tablet (concides with patient's sx of food sticking).  . History of kidney stones   . Hyperlipidemia   . Hypertension    dr Percival Spanish  . IBS (irritable bowel syndrome)   . Memory difficulty 11/26/2015  . Morbid obesity ([place])   . Osteoarthritis    "all over" (02/14/2018)  . Osteopenia   . Osteoporosis   . Palpitations    a. PVC/PAC on EKG 08/2013.  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  Marland Kitchen Pericardial effusion    a. Mod by echo 2012 at time of PNA. b. Echo 07/2012: small pericardial effusion vs fat.  Marland Kitchen PONV (postoperative nausea and vomiting)    n/v, and room spinning after cataract and parotid surgery  . Sleep apnea     "have mask; don't use it" (02/14/2018)  . Superficial phlebitis   . TIA (transient ischemic attack) 10/2014; 06/2016   "some memory issues since; daughter says my speech is sometimes different" (02/14/2018)  . Type II diabetes mellitus ([place])     Past Surgical History:  Procedure Laterality Date  . BREATH TEK H PYLORI N/A 07/29/2013   Procedure: [place];  Surgeon: Shann Medal, MD;  Location: [facility];  Service: General;  Laterality: N/A;  . CARDIAC CATHETERIZATION  2006  . CARDIOVERSION N/A 02/16/2018    Procedure: CARDIOVERSION;  Surgeon: Pixie Casino, MD;  Location: [place];  Service: Cardiovascular;  Laterality: N/A;  . CARPAL TUNNEL RELEASE Left 2011  . CATARACT EXTRACTION W/ INTRAOCULAR LENS  IMPLANT, BILATERAL Bilateral 2003   left  . CHOLECYSTECTOMY OPEN  1982  . COLONOSCOPY N/A 07/14/2015   Procedure: COLONOSCOPY;  Surgeon: Ladene Artist, MD;  Location: [facility];  Service: Endoscopy;  Laterality: N/A;  . ESOPHAGOGASTRODUODENOSCOPY N/A 12/27/2013   Procedure: ESOPHAGOGASTRODUODENOSCOPY (EGD);  Surgeon: Shann Medal, MD;  [facility];  Service: General;  Laterality: N/A;  . ESOPHAGOGASTRODUODENOSCOPY (EGD) WITH ESOPHAGEAL DILATION    . LAPAROSCOPIC GASTRIC SLEEVE RESECTION N/A 01/20/2014   Procedure: LAPAROSCOPIC GASTRIC SLEEVE RESECTION;  Surgeon: Shann Medal, MD;  Location: [facility];  Service: General;  Laterality: N/A;  . PAROTIDECTOMY  09/07/2012   Procedure: PAROTIDECTOMY;  Surgeon: Melida Quitter, MD;  Location: [place];  Service: ENT;  Laterality: Left;  LEFT PAROTIDECTOMY  . TEE WITHOUT CARDIOVERSION N/A 02/16/2018   Procedure: TRANSESOPHAGEAL ECHOCARDIOGRAM (TEE);  Surgeon: Pixie Casino, MD;  Location: [facility];  Service: Cardiovascular;  Laterality: N/A;  . TOE AMPUTATION Left 07/2017   great toe; [facility]   . TONSILLECTOMY  1968  . TUBAL LIGATION  yrs ago  . UPPER GI ENDOSCOPY  01/20/2014   Procedure: UPPER GI ENDOSCOPY;  Surgeon: Shann Medal, MD;  Location: [facility];  Service: General;;    ROS: As stated in the HPI and negative for all other systems.   PHYSICAL EXAM BP (!) 155/61   Pulse (!) 55   Ht 5' 7.5" (1.715 m)   Wt 212 lb 12.8 oz (96.5 kg)   BMI 32.84 kg/m   GENERAL:  Well appearing NECK:  No jugular venous distention, waveform within normal limits, carotid upstroke brisk and symmetric, no bruits, no thyromegaly LUNGS:  Clear to auscultation bilaterally CHEST:  Unremarkable HEART:  PMI not displaced or sustained,S1 and S2 within  normal limits, no S3, no S4, no clicks, no rubs, 3 of 6 apical systolic murmur radiating at the aortic outflow tract, no diastolic murmurs ABD:  Flat, positive bowel sounds normal in frequency in pitch, no bruits, no rebound, no guarding, no midline pulsatile mass, no hepatomegaly, no splenomegaly EXT:  2 plus pulses upper and absent DP/PT, no edema, no cyanosis no clubbing, missing left great toe.      NA  ASSESSMENT AND PLAN  ATRIAL FIB - She says she is had no recurrence of this.  She is tolerating anticoagulation.  Ms. Chastin Garlitz [place] has a CHA2DS2 - VASc score of 3.    AORTIC STENOSIS - She is scheduled next week for heart cath and is being considered for possible TAVR.  I will defer to Dr. Angelena Form.  HYPERTENSION -  Her BP is not controlled and she did not want to take Norvasc because of previous swelling.  I will restart Cozaar which was held in the I do note that this was held in the hospital because of mild hyperkalemia foci on this and will check it.  She is to hold the Cozaar the day before and the day of catheterization.  DM - A1c is still not controlled.  She is seeing Dr. Chalmers Cater.    Obesity -  She had lost 50 lbs and understands need for continued diet and exercise.   CAROTID STENOSIS - I will check a Doppler.  This was ordered in the past by neurology and I don't see that this was done.   PERICARDIAL EFFUSION - There is no significant effusion.   PALPITATIONS - She is not bothered by these.  No change in therapy.

## 2018-03-19 ENCOUNTER — Encounter: Payer: Self-pay | Admitting: Cardiology

## 2018-03-19 ENCOUNTER — Ambulatory Visit (INDEPENDENT_AMBULATORY_CARE_PROVIDER_SITE_OTHER): Payer: Medicare Other | Admitting: Cardiology

## 2018-03-19 VITALS — BP 155/61 | HR 55 | Ht 67.5 in | Wt 212.8 lb

## 2018-03-19 DIAGNOSIS — I10 Essential (primary) hypertension: Secondary | ICD-10-CM

## 2018-03-19 DIAGNOSIS — Z01812 Encounter for preprocedural laboratory examination: Secondary | ICD-10-CM | POA: Diagnosis not present

## 2018-03-19 DIAGNOSIS — R0989 Other specified symptoms and signs involving the circulatory and respiratory systems: Secondary | ICD-10-CM

## 2018-03-19 DIAGNOSIS — I350 Nonrheumatic aortic (valve) stenosis: Secondary | ICD-10-CM

## 2018-03-19 MED ORDER — AMIODARONE HCL 200 MG PO TABS: 200.0000 mg | ORAL_TABLET | Freq: Every day | ORAL | 3 refills | Status: DC

## 2018-03-19 MED ORDER — LOSARTAN POTASSIUM 25 MG PO TABS: 25.0000 mg | ORAL_TABLET | Freq: Every day | ORAL | 3 refills | Status: DC

## 2018-03-19 MED ORDER — METOPROLOL TARTRATE 25 MG PO TABS: 25.0000 mg | ORAL_TABLET | Freq: Two times a day (BID) | ORAL | 6 refills | Status: DC

## 2018-03-19 MED ORDER — APIXABAN 5 MG PO TABS: 5.0000 mg | ORAL_TABLET | Freq: Two times a day (BID) | ORAL | 3 refills | Status: DC

## 2018-03-19 NOTE — Patient Instructions (Signed)
Medication Instructions:  Your physician has recommended you make the following change in your medication:  1) START Cozaar 25mg  tablet by mouth ONCE daily   HOLD this medication 48 hours before your Cardiac Cath procedure.   Labwork: Your physician recommends that you return for lab work in: Tuesday 8/13   Testing/Procedures: Your physician has requested that you have a carotid duplex. This test is an ultrasound of the carotid arteries in your neck. It looks at blood flow through these arteries that supply the brain with blood. Allow one hour for this exam. There are no restrictions or special instructions.    Follow-Up: Your physician recommends that you schedule a follow-up appointment in: [place]   Any Other Special Instructions Will Be Listed Below (If Applicable).     If you need a refill on your cardiac medications before your next appointment, please call your pharmacy.

## 2018-03-27 ENCOUNTER — Telehealth: Payer: Self-pay | Admitting: *Deleted

## 2018-03-27 ENCOUNTER — Telehealth: Payer: Self-pay | Admitting: Family Medicine

## 2018-03-27 ENCOUNTER — Other Ambulatory Visit: Payer: Medicare Other

## 2018-03-27 ENCOUNTER — Ambulatory Visit (HOSPITAL_COMMUNITY)
Admission: RE | Admit: 2018-03-27 | Discharge: 2018-03-27 | Disposition: A | Discharge status: Home / Self Care | Payer: Medicare Other | Source: Ambulatory Visit | Attending: Cardiology | Admitting: Cardiology

## 2018-03-27 DIAGNOSIS — I350 Nonrheumatic aortic (valve) stenosis: Secondary | ICD-10-CM | POA: Diagnosis not present

## 2018-03-27 DIAGNOSIS — R0989 Other specified symptoms and signs involving the circulatory and respiratory systems: Secondary | ICD-10-CM

## 2018-03-27 DIAGNOSIS — Z01812 Encounter for preprocedural laboratory examination: Secondary | ICD-10-CM | POA: Diagnosis not present

## 2018-03-27 LAB — CBC WITH DIFFERENTIAL/PLATELET
Basophils Absolute: 0.1 10*3/uL (ref 0.0–0.2)
Basos: 1 %
EOS (ABSOLUTE): 0.2 10*3/uL (ref 0.0–0.4)
Eos: 2 %
Hematocrit: 41.3 % (ref 34.0–46.6)
Hemoglobin: 13.3 g/dL (ref 11.1–15.9)
Immature Grans (Abs): 0 10*3/uL (ref 0.0–0.1)
Immature Granulocytes: 0 %
Lymphocytes Absolute: 2.6 10*3/uL (ref 0.7–3.1)
Lymphs: 29 %
MCH: 27.4 pg (ref 26.6–33.0)
MCHC: 32.2 g/dL (ref 31.5–35.7)
MCV: 85 fL (ref 79–97)
Monocytes Absolute: 0.6 10*3/uL (ref 0.1–0.9)
Monocytes: 7 %
Neutrophils Absolute: 5.6 10*3/uL (ref 1.4–7.0)
Neutrophils: 61 %
Platelets: 272 10*3/uL (ref 150–450)
RBC: 4.85 x10E6/uL (ref 3.77–5.28)
RDW: 13.5 % (ref 12.3–15.4)
WBC: 9.1 10*3/uL (ref 3.4–10.8)

## 2018-03-27 LAB — BASIC METABOLIC PANEL
BUN/Creatinine Ratio: 22 (ref 12–28)
BUN: 27 mg/dL (ref 8–27)
CO2: 22 mmol/L (ref 20–29)
Calcium: 9.9 mg/dL (ref 8.7–10.3)
Chloride: 105 mmol/L (ref 96–106)
Creatinine, Ser: 1.21 mg/dL — ABNORMAL HIGH (ref 0.57–1.00)
GFR calc Af Amer: 53 mL/min/{1.73_m2} — ABNORMAL LOW (ref >59)
GFR calc non Af Amer: 46 mL/min/{1.73_m2} — ABNORMAL LOW (ref >59)
Glucose: 223 mg/dL — ABNORMAL HIGH (ref 65–99)
Potassium: 5.1 mmol/L (ref 3.5–5.2)
Sodium: 144 mmol/L (ref 134–144)

## 2018-03-27 NOTE — Telephone Encounter (Addendum)
Pt contacted pre-catheterization scheduled at [facility] for: Thursday March 29, 2018 10:30 AM Verified arrival time and place: [place] Entrance A at: 6:30 AM pre-procedure hydration, 4 hours early per Dr Angelena Form,  No solid food after midnight prior to cath, clear liquids until 5 AM day of procedure.  Hold: Eliquis -03/27/18 until post procedure Metformin-day of procedure and 48 hours post procedure. Losartan-day before and day of procedure. Insulin AM of procedure. 1/2 Insulin PM prior to procedure.   Except hold medications AM meds can be  taken pre-cath with sip of water including: ASA 81 mg  Confirmed patient has responsible person to drive home post procedure and for 24 hours after you arrive home: yes

## 2018-03-27 NOTE — Telephone Encounter (Signed)
Called pt.

## 2018-03-28 ENCOUNTER — Ambulatory Visit (INDEPENDENT_AMBULATORY_CARE_PROVIDER_SITE_OTHER): Payer: Medicare Other | Admitting: Family Medicine

## 2018-03-28 ENCOUNTER — Encounter: Payer: Self-pay | Admitting: Family Medicine

## 2018-03-28 VITALS — BP 178/78 | HR 83 | Temp 97.7°F | Ht 67.5 in | Wt 218.0 lb

## 2018-03-28 DIAGNOSIS — I4891 Unspecified atrial fibrillation: Secondary | ICD-10-CM | POA: Diagnosis not present

## 2018-03-28 DIAGNOSIS — I10 Essential (primary) hypertension: Secondary | ICD-10-CM | POA: Diagnosis not present

## 2018-03-28 DIAGNOSIS — I350 Nonrheumatic aortic (valve) stenosis: Secondary | ICD-10-CM

## 2018-03-28 DIAGNOSIS — N183 Chronic kidney disease, stage 3 unspecified: Secondary | ICD-10-CM

## 2018-03-28 DIAGNOSIS — E1151 Type 2 diabetes mellitus with diabetic peripheral angiopathy without gangrene: Secondary | ICD-10-CM | POA: Diagnosis not present

## 2018-03-28 NOTE — Progress Notes (Signed)
Subjective:    Patient ID: [name], female    DOB: Dec 28, 1950, 67 y.o.   MRN: [id number]  HPI Pt here for 4 week follow up on HTN and AFIB. She is having a heart cath.tomorrow.  The patient is here for follow-up today but she has been told to leave off some of her medication because of a heart cath that is planned for tomorrow.  Her initial blood pressure was elevated.  She is been holding medications because of the heart cath.  She appears to be in good spirits.  She has now a history of atrial fibrillation with moderate aortic stenosis.  She has not had a recurrence of the atrial fib and is tolerating anticoagulation without problems.  The heart cath is being considered for possible TAVR or aortic valve replacement.  The patient is pleasant and and alert and in preparation for the heart cath tomorrow.  A repeat blood pressure was 178/78.  Today she denies any complaints or problems and is looking forward to getting this procedure done and moving forward with what ever needs to be done to get the aortic stenosis fixed.   Patient Active Problem List   Diagnosis Date Noted  . Non-STEMI (non-ST elevated myocardial infarction) [facility]): troponin increased to ~with chest pain in setting of new onset A. fib.  7 02/15/2018  . Atrial flutter with rapid ventricular response ([place]) 02/15/2018  . Atrial fibrillation/flutter, new onset ([place]) 02/14/2018  . Diabetic retinopathy ([place]) 09/17/2017  . Moderate aortic stenosis by prior echocardiogram 02/13/2017  . Palpitations 02/13/2017  . Memory difficulty 11/26/2015  . Benign neoplasm of sigmoid colon 07/14/2015  . Low serum vitamin D 05/06/2015  . Vertigo 05/06/2015  . Aphasia 12/08/2014  . TIA (transient ischemic attack), history of 11/07/2014  . 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 CKD stage 3 due to type 2 diabetes mellitus ([place]) 06/03/2014  . Type II diabetes mellitus with renal manifestations ([place]) 06/03/2014  . Type II diabetes mellitus  with peripheral circulatory disorder ([place]) 06/03/2014  . Obesity (BMI 30.0-34.9) 04/24/2014  . Dizziness and giddiness 04/14/2014  . 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 Osteopenia 10/09/2013  . Abnormal ankle brachial index 09/11/2013  . Hypomagnesemia 08/01/2013  . DYSPNEA 08/18/2010  . MYOCARDIAL PERFUSION SCAN, WITH STRESS TEST, ABNORMAL 08/04/2010  . ELECTROCARDIOGRAM, ABNORMAL 07/28/2010  . Edema extremities 12/30/2008  . Hyperlipidemia associated with type 2 diabetes mellitus ([place]) 12/26/2008  . Benign essential HTN 12/26/2008  . Coronary artery disease, non-occlusive 12/26/2008  . PERICARDIAL EFFUSION 12/26/2008  . GERD 12/26/2008  . OSTEOARTHRITIS 12/26/2008   Outpatient Encounter Medications as of 03/28/2018  Medication Sig  . ACCU-CHEK AVIVA PLUS test strip USE TO CHECK BLOOD SUGAR UP TO TWICE DAILY OR AS INSTRUCTED  . amiodarone (PACERONE) 200 MG tablet Take 1 tablet (200 mg total) by mouth daily.  . B-D INS SYR ULTRAFINE 1CC/31G 31G X 5/16" 1 ML MISC USE TO INJECT INSULIN TWICE A DAY AS INSTRUCTED (Patient taking differently: 1 Stick by Other route 2 (two) times daily. )  . Calcium Carbonate-Vitamin D (CALCIUM 600+D) 600-400 MG-UNIT per tablet Take 1 tablet by mouth 2 (two) times daily. (Patient taking differently: Take 1 tablet by mouth daily. )  . Cholecalciferol (VITAMIN D) 2000 units CAPS Take 2,000 Units by mouth daily.   . Insulin Pen Needle 32G X 4 MM MISC Use to inject insulin with insulin pen  . LIVALO 4 MG TABS TAKE 1 TABLET BY MOUTH  ONCE DAILY  . meclizine (ANTIVERT) 12.5 MG tablet Take 1 tablet (12.5 mg total) by mouth 3 (three) times daily as needed for dizziness.  . metoprolol tartrate (LOPRESSOR) 25 MG tablet Take 1 tablet (25 mg total) by mouth 2 (two) times daily.  . Multiple Vitamin (MULTIVITAMIN WITH MINERALS) TABS tablet Take 1 tablet by mouth daily.   . ondansetron (ZOFRAN ODT) 4 MG disintegrating tablet Take 1 tablet (4 mg total) by  mouth every 8 (eight) hours as needed for nausea.  . Propylene Glycol (SYSTANE COMPLETE) 0.6 % SOLN Apply 1 drop to eye daily as needed (dry eyes).   . sodium chloride (OCEAN) 0.65 % SOLN nasal spray Place 1 spray into both nostrils as needed for congestion.  . vitamin E 400 UNIT capsule Take 400 Units by mouth daily.  Marland Kitchen apixaban (ELIQUIS) 5 MG TABS tablet Take 1 tablet (5 mg total) by mouth 2 (two) times daily. (Patient not taking: Reported on 03/28/2018)  . losartan (COZAAR) 25 MG tablet Take 1 tablet (25 mg total) by mouth daily. (Patient not taking: Reported on 03/28/2018)  . metFORMIN (GLUCOPHAGE) 1000 MG tablet TAKE 1 TABLET BY MOUTH 2 TIMES A DAY (Patient not taking: Reported on 03/28/2018)  . NOVOLIN 70/30 RELION (70-30) 100 UNIT/ML injection Inject 15-65 Units into the skin See admin instructions. Taking 65 units with breakfast and 15 units at lunch and 65 units with dinner.  . [DISCONTINUED] Olmesartan-Amlodipine-HCTZ (TRIBENZOR) 40-5-12.5 MG TABS Take 1 tablet by mouth daily.     No facility-administered encounter medications on file as of 03/28/2018.       Review of Systems  Constitutional: Negative.   HENT: Negative.   Eyes: Negative.   Respiratory: Negative.   Cardiovascular: Negative.   Gastrointestinal: Negative.   Endocrine: Negative.   Genitourinary: Negative.   Musculoskeletal: Negative.   Skin: Negative.   Allergic/Immunologic: Negative.   Neurological: Negative.   Hematological: Negative.   Psychiatric/Behavioral: Negative.        Objective:   Physical Exam  Constitutional: She is oriented to person, place, and time. She appears well-developed and well-nourished. No distress.  Is pleasant and alert  HENT:  Head: Normocephalic and atraumatic.  Eyes: Pupils are equal, round, and reactive to light. Conjunctivae and EOM are normal. Right eye exhibits no discharge. Left eye exhibits no discharge. No scleral icterus.  Neck: Normal range of motion.  Cardiovascular:  Normal rate.  Murmur heard. Irregular irregular rhythm at 72 to 84/min with grade 2/6 to 3/6 systolic ejection murmur  Pulmonary/Chest: Effort normal and breath sounds normal. No respiratory distress. She has no wheezes. She has no rales.  Musculoskeletal: Normal range of motion. She exhibits no edema.  Lymphadenopathy:    She has no cervical adenopathy.  Neurological: She is alert and oriented to person, place, and time. She has normal reflexes.  Skin: Skin is warm and dry. No rash noted.  Psychiatric: She has a normal mood and affect. Her behavior is normal. Judgment and thought content normal.  Patient's mood affect and behavior are normal for her.  Nursing note and vitals reviewed.  BP (!) 191/79 (BP Location: Left Arm)   Pulse 83   Temp 97.7 F (36.5 C) (Oral)   Ht 5' 7.5" (1.715 m)   Wt 218 lb (98.9 kg)   BMI 33.64 kg/m         Assessment & Plan:  1. Benign essential HTN -Patient's blood pressure is more elevated today even on the second check today.  She is been told to leave some of her medicines off and they are aware of the elevated blood pressure.  2. Type II diabetes mellitus with peripheral circulatory disorder (HCC) -Continue with good blood sugar control  3. Chronic kidney disease, stage 3 (moderate) (HCC) -Get blood pressure under control as soon as possible following the cath that is planned tomorrow  4. Nonrheumatic aortic valve stenosis -Possible aortic valve replacement following catheterization study is being done tomorrow.  5. Atrial fibrillation, unspecified type [facility]) -She remains in atrial fibrillation at about 72 to 84/min  Patient Instructions  Follow-up and get catheterization tomorrow with cardiology   Arrie Senate MD

## 2018-03-28 NOTE — Patient Instructions (Signed)
Follow-up and get catheterization tomorrow with cardiology

## 2018-03-29 ENCOUNTER — Ambulatory Visit (HOSPITAL_COMMUNITY)
Admission: RE | Admit: 2018-03-29 | Discharge: 2018-03-29 | Disposition: A | Discharge status: Home / Self Care | Payer: Medicare Other | Source: Ambulatory Visit | Attending: Cardiovascular Disease | Admitting: Cardiovascular Disease

## 2018-03-29 ENCOUNTER — Encounter: Payer: Self-pay | Admitting: Physician Assistant

## 2018-03-29 ENCOUNTER — Other Ambulatory Visit: Payer: Self-pay

## 2018-03-29 ENCOUNTER — Other Ambulatory Visit: Payer: Self-pay | Admitting: Physician Assistant

## 2018-03-29 ENCOUNTER — Encounter (HOSPITAL_COMMUNITY)
Admission: RE | Disposition: A | Discharge status: Home / Self Care | Payer: Self-pay | Source: Ambulatory Visit | Attending: Cardiovascular Disease

## 2018-03-29 ENCOUNTER — Encounter (HOSPITAL_COMMUNITY): Payer: Self-pay | Admitting: *Deleted

## 2018-03-29 DIAGNOSIS — I4891 Unspecified atrial fibrillation: Secondary | ICD-10-CM | POA: Diagnosis not present

## 2018-03-29 DIAGNOSIS — I2584 Coronary atherosclerosis due to calcified coronary lesion: Secondary | ICD-10-CM | POA: Insufficient documentation

## 2018-03-29 DIAGNOSIS — Z7901 Long term (current) use of anticoagulants: Secondary | ICD-10-CM | POA: Insufficient documentation

## 2018-03-29 DIAGNOSIS — E785 Hyperlipidemia, unspecified: Secondary | ICD-10-CM | POA: Diagnosis not present

## 2018-03-29 DIAGNOSIS — G4730 Sleep apnea, unspecified: Secondary | ICD-10-CM | POA: Insufficient documentation

## 2018-03-29 DIAGNOSIS — E669 Obesity, unspecified: Secondary | ICD-10-CM | POA: Insufficient documentation

## 2018-03-29 DIAGNOSIS — Z8673 Personal history of transient ischemic attack (TIA), and cerebral infarction without residual deficits: Secondary | ICD-10-CM | POA: Insufficient documentation

## 2018-03-29 DIAGNOSIS — I6529 Occlusion and stenosis of unspecified carotid artery: Secondary | ICD-10-CM | POA: Insufficient documentation

## 2018-03-29 DIAGNOSIS — I350 Nonrheumatic aortic (valve) stenosis: Secondary | ICD-10-CM | POA: Diagnosis not present

## 2018-03-29 DIAGNOSIS — E1122 Type 2 diabetes mellitus with diabetic chronic kidney disease: Secondary | ICD-10-CM | POA: Diagnosis not present

## 2018-03-29 DIAGNOSIS — Z880 Allergy status to penicillin: Secondary | ICD-10-CM | POA: Diagnosis not present

## 2018-03-29 DIAGNOSIS — Z6832 Body mass index (BMI) 32.0-32.9, adult: Secondary | ICD-10-CM | POA: Diagnosis not present

## 2018-03-29 DIAGNOSIS — M1990 Unspecified osteoarthritis, unspecified site: Secondary | ICD-10-CM | POA: Diagnosis not present

## 2018-03-29 DIAGNOSIS — N183 Chronic kidney disease, stage 3 (moderate): Secondary | ICD-10-CM | POA: Diagnosis not present

## 2018-03-29 DIAGNOSIS — I2510 Atherosclerotic heart disease of native coronary artery without angina pectoris: Secondary | ICD-10-CM | POA: Diagnosis not present

## 2018-03-29 DIAGNOSIS — K589 Irritable bowel syndrome without diarrhea: Secondary | ICD-10-CM | POA: Diagnosis not present

## 2018-03-29 DIAGNOSIS — Z794 Long term (current) use of insulin: Secondary | ICD-10-CM | POA: Insufficient documentation

## 2018-03-29 DIAGNOSIS — I129 Hypertensive chronic kidney disease with stage 1 through stage 4 chronic kidney disease, or unspecified chronic kidney disease: Secondary | ICD-10-CM | POA: Diagnosis not present

## 2018-03-29 DIAGNOSIS — K219 Gastro-esophageal reflux disease without esophagitis: Secondary | ICD-10-CM | POA: Insufficient documentation

## 2018-03-29 DIAGNOSIS — M810 Age-related osteoporosis without current pathological fracture: Secondary | ICD-10-CM | POA: Insufficient documentation

## 2018-03-29 HISTORY — PX: RIGHT/LEFT HEART CATH AND CORONARY ANGIOGRAPHY: CATH118266

## 2018-03-29 LAB — POCT I-STAT 3, ART BLOOD GAS (G3+)
Acid-base deficit: 4 mmol/L — ABNORMAL HIGH (ref 0.0–2.0)
Bicarbonate: 21.5 mmol/L (ref 20.0–28.0)
O2 Saturation: 92 %
TCO2: 23 mmol/L (ref 22–32)
pCO2 arterial: 39.9 mmHg (ref 32.0–48.0)
pH, Arterial: 7.339 — ABNORMAL LOW (ref 7.350–7.450)
pO2, Arterial: 69 mmHg — ABNORMAL LOW (ref 83.0–108.0)

## 2018-03-29 LAB — POCT I-STAT 3, VENOUS BLOOD GAS (G3P V)
Acid-base deficit: 3 mmol/L — ABNORMAL HIGH (ref 0.0–2.0)
Bicarbonate: 22.8 mmol/L (ref 20.0–28.0)
O2 Saturation: 66 %
TCO2: 24 mmol/L (ref 22–32)
pCO2, Ven: 43.5 mmHg — ABNORMAL LOW (ref 44.0–60.0)
pH, Ven: 7.328 (ref 7.250–7.430)
pO2, Ven: 37 mmHg (ref 32.0–45.0)

## 2018-03-29 LAB — GLUCOSE, CAPILLARY
Glucose-Capillary: 136 mg/dL — ABNORMAL HIGH (ref 70–99)
Glucose-Capillary: 133 mg/dL — ABNORMAL HIGH (ref 70–99)

## 2018-03-29 SURGERY — RIGHT/LEFT HEART CATH AND CORONARY ANGIOGRAPHY
Anesthesia: LOCAL

## 2018-03-29 MED ORDER — LIDOCAINE HCL (PF) 1 % IJ SOLN
INTRAMUSCULAR | Status: DC | PRN
  Administered 2018-03-29: 5 mL

## 2018-03-29 MED ORDER — FENTANYL CITRATE (PF) 100 MCG/2ML IJ SOLN
INTRAMUSCULAR | Status: DC | PRN
  Administered 2018-03-29: 25 ug via INTRAVENOUS

## 2018-03-29 MED ORDER — SODIUM CHLORIDE 0.9% FLUSH: 3.0000 mL | Freq: Two times a day (BID) | INTRAVENOUS | Status: DC

## 2018-03-29 MED ORDER — SODIUM CHLORIDE 0.9% FLUSH: 3.0000 mL | INTRAVENOUS | Status: DC | PRN

## 2018-03-29 MED ORDER — ONDANSETRON HCL 4 MG/2ML IJ SOLN
4.0000 mg | Freq: Four times a day (QID) | INTRAMUSCULAR | Status: DC | PRN
  Administered 2018-03-29: 4 mg via INTRAVENOUS
  Filled 2018-03-29: qty 2

## 2018-03-29 MED ORDER — FENTANYL CITRATE (PF) 100 MCG/2ML IJ SOLN
INTRAMUSCULAR | Status: AC
  Filled 2018-03-29: qty 2

## 2018-03-29 MED ORDER — ASPIRIN 81 MG PO CHEW: 81.0000 mg | CHEWABLE_TABLET | ORAL | Status: DC

## 2018-03-29 MED ORDER — VERAPAMIL HCL 2.5 MG/ML IV SOLN
INTRAVENOUS | Status: DC | PRN
  Administered 2018-03-29: 10 mL via INTRA_ARTERIAL

## 2018-03-29 MED ORDER — MIDAZOLAM HCL 2 MG/2ML IJ SOLN
INTRAMUSCULAR | Status: DC | PRN
  Administered 2018-03-29: 1 mg via INTRAVENOUS

## 2018-03-29 MED ORDER — LIDOCAINE HCL (PF) 1 % IJ SOLN
INTRAMUSCULAR | Status: AC
  Filled 2018-03-29: qty 30

## 2018-03-29 MED ORDER — HYDRALAZINE HCL 20 MG/ML IJ SOLN
INTRAMUSCULAR | Status: AC
  Filled 2018-03-29: qty 1

## 2018-03-29 MED ORDER — HEPARIN (PORCINE) IN NACL 1000-0.9 UT/500ML-% IV SOLN
INTRAVENOUS | Status: DC | PRN
  Administered 2018-03-29 (×2): 500 mL

## 2018-03-29 MED ORDER — GI COCKTAIL ~~LOC~~
30.0000 mL | Freq: Once | ORAL | Status: AC
  Administered 2018-03-29: 30 mL via ORAL
  Filled 2018-03-29: qty 30

## 2018-03-29 MED ORDER — ACETAMINOPHEN 325 MG PO TABS: 650.0000 mg | ORAL_TABLET | ORAL | Status: DC | PRN

## 2018-03-29 MED ORDER — IOHEXOL 350 MG/ML SOLN
INTRAVENOUS | Status: DC | PRN
  Administered 2018-03-29: 80 mL

## 2018-03-29 MED ORDER — MIDAZOLAM HCL 2 MG/2ML IJ SOLN
INTRAMUSCULAR | Status: AC
  Filled 2018-03-29: qty 2

## 2018-03-29 MED ORDER — SODIUM CHLORIDE 0.9 % IV SOLN
INTRAVENOUS | Status: AC
  Administered 2018-03-29: 07:00:00 via INTRAVENOUS

## 2018-03-29 MED ORDER — SODIUM CHLORIDE 0.9 % IV SOLN: 250.0000 mL | INTRAVENOUS | Status: DC | PRN

## 2018-03-29 MED ORDER — SODIUM CHLORIDE 0.9 % IV SOLN
INTRAVENOUS | Status: AC
  Administered 2018-03-29: 13:00:00 via INTRAVENOUS

## 2018-03-29 MED ORDER — HEPARIN SODIUM (PORCINE) 1000 UNIT/ML IJ SOLN
INTRAMUSCULAR | Status: AC
  Filled 2018-03-29: qty 1

## 2018-03-29 MED ORDER — HEPARIN (PORCINE) IN NACL 1000-0.9 UT/500ML-% IV SOLN
INTRAVENOUS | Status: AC
  Filled 2018-03-29: qty 1000

## 2018-03-29 MED ORDER — HEPARIN SODIUM (PORCINE) 1000 UNIT/ML IJ SOLN
INTRAMUSCULAR | Status: DC | PRN
  Administered 2018-03-29: 5000 [IU] via INTRAVENOUS

## 2018-03-29 MED ORDER — HYDRALAZINE HCL 20 MG/ML IJ SOLN
INTRAMUSCULAR | Status: DC | PRN
  Administered 2018-03-29 (×2): 10 mg via INTRAVENOUS

## 2018-03-29 MED ORDER — VERAPAMIL HCL 2.5 MG/ML IV SOLN
INTRAVENOUS | Status: AC
  Filled 2018-03-29: qty 2

## 2018-03-29 SURGICAL SUPPLY — 13 items
CATH 5FR JL3.5 JR4 ANG PIG MP (CATHETERS) ×1 IMPLANT
CATH BALLN WEDGE 5F 110CM (CATHETERS) ×1 IMPLANT
CATH INFINITI 5FR AL1 (CATHETERS) ×1 IMPLANT
DEVICE RAD COMP TR BAND LRG (VASCULAR PRODUCTS) ×1 IMPLANT
GLIDESHEATH SLEND SS 6F .021 (SHEATH) ×1 IMPLANT
GUIDEWIRE INQWIRE 1.5J.035X260 (WIRE) IMPLANT
INQWIRE 1.5J .035X260CM (WIRE) ×4
KIT HEART LEFT (KITS) ×2 IMPLANT
PACK CARDIAC CATHETERIZATION (CUSTOM PROCEDURE TRAY) ×2 IMPLANT
SHEATH GLIDE SLENDER 4/5FR (SHEATH) ×1 IMPLANT
TRANSDUCER W/STOPCOCK (MISCELLANEOUS) ×2 IMPLANT
TUBING CIL FLEX 10 FLL-RA (TUBING) ×2 IMPLANT
WIRE EMERALD ST .035X150CM (WIRE) ×1 IMPLANT

## 2018-03-29 NOTE — Interval H&P Note (Signed)
History and Physical Interval Note:  03/29/2018 10:49 AM  [name]  has presented today for cardiac cath with the diagnosis of severe aortic stenosis. The various methods of treatment have been discussed with the patient and family. After consideration of risks, benefits and other options for treatment, the patient has consented to  Procedure(s): RIGHT/LEFT HEART CATH AND CORONARY ANGIOGRAPHY (N/A) as a surgical intervention .  The patient's history has been reviewed, patient examined, no change in status, stable for surgery.  I have reviewed the patient's chart and labs.  Questions were answered to the patient's satisfaction.     Cath Lab Visit (complete for each Cath Lab visit)  Clinical Evaluation Leading to the Procedure:   ACS: No.  Non-ACS:    Anginal Classification: CCS II  Anti-ischemic medical therapy: Minimal Therapy (1 class of medications)  Non-Invasive Test Results: No non-invasive testing performed  Prior CABG: No previous CABG        Lauree Chandler

## 2018-03-29 NOTE — Progress Notes (Addendum)
**Note [name]-Identified via Obfuscation** Patient complained of epigastric pain post meal. Received PRN zofran and confirmed medication not effective. Patient then stated she has 5/10 pain to left side of chest. Vital signs stable. EKG completed and no pertinent changes noted compared to previous EKG. Denies shortness of breath, palpitations, or dizziness. On-call PA, Ria Comment, paged and notified of patient's complaints and status. Order given for GI cocktail x1. Patient responded well to GI cocktail and states pain is no longer present and she does not feel nauseous. Fall/ safety precautions implemented. Patient stable upon discharge. Family present at bedside.

## 2018-03-29 NOTE — Progress Notes (Signed)
Upper abdomen discomfort/ not sure if its nausea or not,  pt unable to describe but has gastric sleeve. zofran helping her feeling better.

## 2018-03-29 NOTE — Discharge Instructions (Signed)
**Note Niurka Benecke-Identified via Obfuscation** Resume Eliquis tomorrow on 03/30/18 if no bleeding from cath site in arms.    Radial Site Care Refer to this sheet in the next few weeks. These instructions provide you with information about caring for yourself after your procedure. Your health care provider may also give you more specific instructions. Your treatment has been planned according to current medical practices, but problems sometimes occur. Call your health care provider if you have any problems or questions after your procedure. What can I expect after the procedure? After your procedure, it is typical to have the following:  Bruising at the radial site that usually fades within 1-2 weeks.  Blood collecting in the tissue (hematoma) that may be painful to the touch. It should usually decrease in size and tenderness within 1-2 weeks.  Follow these instructions at home:  Take medicines only as directed by your health care provider.  You may shower 24-48 hours after the procedure or as directed by your health care provider. Remove the bandage (dressing) and gently wash the site with plain soap and water. Pat the area dry with a clean towel. Do not rub the site, because this may cause bleeding.  Do not take baths, swim, or use a hot tub until your health care provider approves.  Check your insertion site every day for redness, swelling, or drainage.  Do not apply powder or lotion to the site.  Do not flex or bend the affected arm for 24 hours or as directed by your health care provider.  Do not push or pull heavy objects with the affected arm for 24 hours or as directed by your health care provider.  Do not lift over 10 lb (4.5 kg) for 5 days after your procedure or as directed by your health care provider.  Ask your health care provider when it is okay to: ? Return to work or school. ? Resume usual physical activities or sports. ? Resume sexual activity.  Do not drive home if you are discharged the same day as the  procedure. Have someone else drive you.  You may drive 24 hours after the procedure unless otherwise instructed by your health care provider.  Do not operate machinery or power tools for 24 hours after the procedure.  If your procedure was done as an outpatient procedure, which means that you went home the same day as your procedure, a responsible adult should be with you for the first 24 hours after you arrive home.  Keep all follow-up visits as directed by your health care provider. This is important. Contact a health care provider if:  You have a fever.  You have chills.  You have increased bleeding from the radial site. Hold pressure on the site. Get help right away if:  You have unusual pain at the radial site.  You have redness, warmth, or swelling at the radial site.  You have drainage (other than a small amount of blood on the dressing) from the radial site.  The radial site is bleeding, and the bleeding does not stop after 30 minutes of holding steady pressure on the site.  Your arm or hand becomes pale, cool, tingly, or numb. This information is not intended to replace advice given to you by your health care provider. Make sure you discuss any questions you have with your health care provider. Document Released: 09/03/2010 Document Revised: 01/07/2016 Document Reviewed: 02/17/2014 Elsevier Interactive Patient Education  2018 [employer].

## 2018-03-30 ENCOUNTER — Encounter (HOSPITAL_COMMUNITY): Payer: Self-pay | Admitting: Cardiovascular Disease

## 2018-04-03 ENCOUNTER — Telehealth: Payer: Self-pay | Admitting: Family Medicine

## 2018-04-03 ENCOUNTER — Telehealth: Payer: Self-pay | Admitting: Cardiology

## 2018-04-03 NOTE — Telephone Encounter (Signed)
Pt with severe AS, currently undergoing TAVR w/u and had recent diagnostic [facility] cath last week, which showed moderate CAD to be treated medically. Patient called after hours given elevated blood pressure readings at home.  Patient reports that she felt mildly short of breath earlier today and had some wheezing.  She thought it was allergy related and took some Benadryl which improved symptoms.  She then checked her blood pressure was noted to be severely elevated in the low 350K systolic.  She denied any symptoms of headache, chest pain, dizziness, syncope/near syncope and no visual changes.  She reports that she had just eaten a hamburger from a fast food restaurant.  She took her evening dose of metoprolol roughly 30 minutes ago and rechecked her blood pressure which had improved down to 187/71.  At time of telephone encounter patient reports that she was completely asymptomatic.  She rechecked her BP again and now at 166/71. Resting HR in the 50s, limiting further titration of metoprolol. She reports full compliance with home antihypertensives. She took her losartan this morning and compliant with BID metoprolol.  She was instructed to avoid any additional salt and caffeine tonight.  Pt advised to recheck her blood pressure in the morning after taking her home medications and to notify our office if her BP remains elevated and see about work in visit with APP or in the HTN clinic.

## 2018-04-03 NOTE — Telephone Encounter (Signed)
Pt has elevated bp Will contact cardiology due to recent cath

## 2018-04-04 ENCOUNTER — Telehealth: Payer: Self-pay

## 2018-04-04 DIAGNOSIS — Z79899 Other long term (current) drug therapy: Secondary | ICD-10-CM

## 2018-04-04 NOTE — Telephone Encounter (Signed)
I would like to check another BMET before I increase her Cozaar.  Please order this.

## 2018-04-04 NOTE — Telephone Encounter (Signed)
[name], Alison M, PA-C  P Cv Div Nl Triage        Please f/u with this pt tomorrow morning, 04/04/18, with telephone call. Pt called after hours with complaint of elevated BP, that started to improve after taking evening dose of metoprolol. If her BP remains elevated, we may need to try to increase her losartan dose and arrange close f/u either in the HTN clinic or w/ an APP.   Returned call to pt she states that her BP is still too high and last night it was better for awhile then she woke up again and her BP was 22/90 HR 92, then 185*73 HR 89 then 158/72. This morning after her medications it was 153/65 HR 47.  Scheduled appt 05-11-17 w/KL. She is doing some testing for TAVR in September. She woul like to know if Dr Percival Spanish will adjust her medication before the scheduled appt.

## 2018-04-05 NOTE — Telephone Encounter (Signed)
Left detailed message as noted for patient, to come in for lab (DPR), call back w/any questions.  bmet ordered, will await lab results

## 2018-04-06 DIAGNOSIS — D1801 Hemangioma of skin and subcutaneous tissue: Secondary | ICD-10-CM | POA: Diagnosis not present

## 2018-04-06 DIAGNOSIS — L821 Other seborrheic keratosis: Secondary | ICD-10-CM | POA: Diagnosis not present

## 2018-04-06 DIAGNOSIS — L918 Other hypertrophic disorders of the skin: Secondary | ICD-10-CM | POA: Diagnosis not present

## 2018-04-06 DIAGNOSIS — L304 Erythema intertrigo: Secondary | ICD-10-CM | POA: Diagnosis not present

## 2018-04-06 DIAGNOSIS — L708 Other acne: Secondary | ICD-10-CM | POA: Diagnosis not present

## 2018-04-11 NOTE — Progress Notes (Signed)
Cardiology Office Note   Date:  04/12/2018   ID:  Alison, [name] [birth year], MRN [id number]  PCP:  Chipper Herb, MD  Cardiologist:  [facility]  Chief Complaint  Patient presents with  . Follow-up     History of Present Illness: [name] is a 67 y.o. female who presents for ongoing assessment and management of hypertension and AoV stenosis. She is scheduled to have pre-TAVR work-up. However, she called our office on 04/04/2018 with complaints of elevated BP at home, 220/90 and 185/73. She was to have BMET drawn before increasing dose of Cozaar for better BP control.   She comes today with worsening dyspnea, wheezing, and fluid retention. She is not able to lay flat in the bed due to breathing PND symptoms, and now is sleeping in a recliner. She is more dyspneic when she walks. She has noticed BP more elevated.   She brings with her BP recordings at home, up to 220/98. She is not adhering to a low sodium diet. "I eat pretty much what I want to."    Past Medical History:  Diagnosis Date  . Abnormal Doppler ultrasound of carotid artery    a. 05/2013 - mild plaque, 1-39% BICA, abnormal appearrance of thyroid (TSH wnl in 06/2013).  Marland Kitchen CAD (coronary artery disease)    a. Nonobstructive by cath 2006 - 25% LAD, 25-30% after 1st diag, distal 25% PDA, minor irreg LCx. b. Normal nuc 2011.  . CKD (chronic kidney disease), stage III ([place])   . Cystocele   . Difficult intubation 09-07-2012   big neck trouble with intubation parotid surgery"takes little med to sedate"  . Dyslipidemia   . Dysrhythmia    hx a fib  . Esophageal stricture   . GERD (gastroesophageal reflux disease)   . Hiatal hernia    a. 07/2013: HH with small stricture holding up barium tablet (concides with patient's sx of food sticking).  . History of kidney stones   . Hyperlipidemia   . Hypertension    dr Percival Spanish  . IBS (irritable bowel syndrome)   . Memory difficulty 11/26/2015  . Morbid obesity ([place])   .  Osteoarthritis    "all over" (02/14/2018)  . Osteopenia   . Osteoporosis   . Palpitations    a. PVC/PAC on EKG 08/2013.  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  Marland Kitchen Pericardial effusion    a. Mod by echo 2012 at time of PNA. b. Echo 07/2012: small pericardial effusion vs fat.  Marland Kitchen PONV (postoperative nausea and vomiting)    n/v, and room spinning after cataract and parotid surgery  . Sleep apnea     "have mask; don't use it" (02/14/2018)  . Superficial phlebitis   . TIA (transient ischemic attack) 10/2014; 06/2016   "some memory issues since; daughter says my speech is sometimes different" (02/14/2018)  . Type II diabetes mellitus ([place])     Past Surgical History:  Procedure Laterality Date  . BREATH TEK H PYLORI N/A 07/29/2013   Procedure: [place];  Surgeon: Shann Medal, MD;  Location: [facility];  Service: General;  Laterality: N/A;  . CARDIAC CATHETERIZATION  2006  . CARDIOVERSION N/A 02/16/2018   Procedure: CARDIOVERSION;  Surgeon: Pixie Casino, MD;  Location: [place];  Service: Cardiovascular;  Laterality: N/A;  . CARPAL TUNNEL RELEASE Left 2011  . CATARACT EXTRACTION W/ INTRAOCULAR LENS  IMPLANT, BILATERAL Bilateral 2003   left  . CHOLECYSTECTOMY OPEN  1982  . COLONOSCOPY N/A 07/14/2015   Procedure: COLONOSCOPY;  Surgeon: Ladene Artist, MD;  Location: [facility];  Service: Endoscopy;  Laterality: N/A;  . ESOPHAGOGASTRODUODENOSCOPY N/A 12/27/2013   Procedure: ESOPHAGOGASTRODUODENOSCOPY (EGD);  Surgeon: Shann Medal, MD;  Location: [facility];  Service: General;  Laterality: N/A;  . ESOPHAGOGASTRODUODENOSCOPY (EGD) WITH ESOPHAGEAL DILATION    . LAPAROSCOPIC GASTRIC SLEEVE RESECTION N/A 01/20/2014   Procedure: LAPAROSCOPIC GASTRIC SLEEVE RESECTION;  Surgeon: Shann Medal, MD;  Location: [facility];  Service: General;  Laterality: N/A;  . PAROTIDECTOMY  09/07/2012   Procedure: PAROTIDECTOMY;  Surgeon: Melida Quitter, MD;  Location: [place];   Service: ENT;  Laterality: Left;  LEFT PAROTIDECTOMY  . RIGHT/LEFT HEART CATH AND CORONARY ANGIOGRAPHY N/A 03/29/2018   Procedure: RIGHT/LEFT HEART CATH AND CORONARY ANGIOGRAPHY;  Surgeon: Burnell Blanks, MD;  Location: [facility];  Service: Cardiovascular;  Laterality: N/A;  . TEE WITHOUT CARDIOVERSION N/A 02/16/2018   Procedure: TRANSESOPHAGEAL ECHOCARDIOGRAM (TEE);  Surgeon: Pixie Casino, MD;  Location: [facility];  Service: Cardiovascular;  Laterality: N/A;  . TOE AMPUTATION Left 07/2017   great toe; [facility]   . TONSILLECTOMY  1968  . TUBAL LIGATION  yrs ago  . UPPER GI ENDOSCOPY  01/20/2014   Procedure: UPPER GI ENDOSCOPY;  Surgeon: Shann Medal, MD;  Location: [facility];  Service: General;;     Current Outpatient Medications  Medication Sig Dispense Refill  . ACCU-CHEK AVIVA PLUS test strip USE TO CHECK BLOOD SUGAR UP TO TWICE DAILY OR AS INSTRUCTED 100 each 2  . amiodarone (PACERONE) 200 MG tablet Take 1 tablet (200 mg total) by mouth daily. 90 tablet 3  . apixaban (ELIQUIS) 5 MG TABS tablet Take 1 tablet (5 mg total) by mouth 2 (two) times daily. 180 tablet 3  . B-D INS SYR ULTRAFINE 1CC/31G 31G X 5/16" 1 ML MISC USE TO INJECT INSULIN TWICE A DAY AS INSTRUCTED (Patient taking differently: 1 Stick by Other route 2 (two) times daily. ) 100 each 2  . Calcium Carbonate-Vitamin D (CALCIUM 600+D) 600-400 MG-UNIT per tablet Take 1 tablet by mouth 2 (two) times daily. (Patient taking differently: Take 1 tablet by mouth daily. )    . Cholecalciferol (VITAMIN D) 2000 units CAPS Take 2,000 Units by mouth daily.     . Insulin Pen Needle 32G X 4 MM MISC Use to inject insulin with insulin pen 100 each 4  . LIVALO 4 MG TABS TAKE 1 TABLET BY MOUTH ONCE DAILY 90 tablet 1  . losartan (COZAAR) 25 MG tablet Take 1.5 tablets (37.5 mg total) by mouth daily. 90 tablet 3  . meclizine (ANTIVERT) 12.5 MG tablet Take 1 tablet (12.5 mg total) by mouth 3 (three) times daily as needed for  dizziness. 30 tablet 1  . metFORMIN (GLUCOPHAGE) 1000 MG tablet TAKE 1 TABLET BY MOUTH 2 TIMES A DAY 180 tablet 0  . metoprolol tartrate (LOPRESSOR) 25 MG tablet Take 1 tablet (25 mg total) by mouth 2 (two) times daily. 60 tablet 6  . Multiple Vitamin (MULTIVITAMIN WITH MINERALS) TABS tablet Take 1 tablet by mouth daily.     Marland Kitchen NOVOLIN 70/30 RELION (70-30) 100 UNIT/ML injection Inject 15-65 Units into the skin See admin instructions. Taking 65 units with breakfast and 15 units at lunch and 65 units with dinner.    . ondansetron (ZOFRAN ODT) 4 MG disintegrating tablet Take 1 tablet (4 mg total) by mouth every 8 (eight) hours as needed for  nausea. 10 tablet 0  . Propylene Glycol (SYSTANE COMPLETE) 0.6 % SOLN Apply 1 drop to eye daily as needed (dry eyes).     . sodium chloride (OCEAN) 0.65 % SOLN nasal spray Place 1 spray into both nostrils as needed for congestion. 15 mL 0  . vitamin E 400 UNIT capsule Take 400 Units by mouth daily.    . furosemide (LASIX) 20 MG tablet Take 1 tablet (20 mg total) by mouth daily. 30 tablet 1   No current facility-administered medications for this visit.     Allergies:   Atorvastatin; Crestor [rosuvastatin calcium]; Ezetimibe-simvastatin; Celebrex [celecoxib]; Levemir [insulin detemir]; and Penicillins    Social History:  The patient  reports that she has never smoked. She has never used smokeless tobacco. She reports that she drinks alcohol. She reports that she does not use drugs.   Family History:  The patient's family history includes Colitis in her mother; Colon polyps in her father; Diabetes in her father, maternal grandfather, mother, and other; Heart attack in her sister; Heart disease in her father; Heart failure in her father and mother; Hypertension in her mother; Kidney Stones in her mother; Skin cancer in her mother; Stroke in her maternal grandfather and mother.    ROS: All other systems are reviewed and negative. Unless otherwise mentioned in H&P     PHYSICAL EXAM: VS:  BP (!) 177/75   Pulse (!) 58   Ht 5' 7.5" (1.715 m)   Wt 223 lb 6.4 oz (101.3 kg)   BMI 34.47 kg/m  , BMI Body mass index is 34.47 kg/m. GEN: Well nourished, well developed, in no acute distress  HEENT: normal  Neck: no JVD, carotid bruits, or masses Cardiac: RRR 3/6 holo-systolic murmurs, rubs, or gallops, 2+ ankle edema  Respiratory:  Clear to auscultation bilaterally, mild dyspnea while seated. No wheezes.  GI: soft, nontender, distended, + BS  MS: no deformity or atrophy  Skin: warm and dry, no rash Neuro:  Strength and sensation are intact Psych: euthymic mood, full affect   EKG:  Not completed in the office today.   Recent Labs: 02/14/2018: TSH 0.780 02/18/2018: Magnesium 1.8 02/21/2018: ALT 18 03/13/2018: BNP 163.3 03/27/2018: BUN 27; Creatinine, Ser 1.21; Hemoglobin 13.3; Platelets 272; Potassium 5.1; Sodium 144    Lipid Panel    Component Value Date/Time   CHOL 194 02/15/2018 0239   CHOL 186 12/14/2017 1020   CHOL 286 (H) 01/24/2013 1100   TRIG 222 (H) 02/15/2018 0239   TRIG 200 (H) 06/03/2014 1132   TRIG 468 (H) 01/24/2013 1100   HDL 52 02/15/2018 0239   HDL 45 12/14/2017 1020   HDL 43 06/03/2014 1132   HDL 47 01/24/2013 1100   CHOLHDL 3.7 02/15/2018 0239   VLDL 44 (H) 02/15/2018 0239   LDLCALC 98 02/15/2018 0239   LDLCALC 97 12/14/2017 1020   LDLCALC 101 (H) 06/03/2014 1132   LDLCALC 145 (H) 01/24/2013 1100   LDLDIRECT 132 (H) 05/09/2016 0814   LDLDIRECT 113 (H) 02/26/2013 0845      Wt Readings from Last 3 Encounters:  04/12/18 223 lb 6.4 oz (101.3 kg)  03/29/18 218 lb (98.9 kg)  03/28/18 218 lb (98.9 kg)      Other studies Reviewed: Echocardiogram 03/08/18  Left ventricle: Severe LVH. Small LV cavity. Systolic function   was vigorous. The estimated ejection fraction was in the range of   65% to 70%. Wall motion was normal; there were no regional wall   motion abnormalities. -  Aortic valve: Calcified with restricted  leaflet motion,   especially the non-coronary cusp which appears immobile. Severe   stenosis. Mean gradient (S): 32 mm Hg. Peak gradient (S): 57 mm   Hg. Valve area (VTI): 0.9 cm^2. Valve area (Vmax): 0.92 cm^2.   Valve area (Vmean): 0.7 cm^2. - Mitral valve: Mildly calcified annulus. Sclerotic leafelts,   possibly rheumatic. Mild regurgitation. - Left atrium: The atrium was dilated. No evidence of thrombus in   the atrial cavity or appendage. - Right atrium: The atrium was dilated. - Atrial septum: No defect or patent foramen ovale was identified. - Tricuspid valve: There was mild regurgitation. - Pericardium, extracardiac: Small pericardial effusion.  Impressions:  - LVEF 65-70%, severe LVH, severe aortic stenosis - mean gradient   of 32 mmHg - AVA 0.9 cm2, small pericardial effusion. Successful   DCCV to junctional rhythm.   ASSESSMENT AND PLAN:  1.  Severe AoV Disease: Currently in the process of pre-TAVR work up. Has had cardiac cath. Is meeting with Dr. Cyndia Bent next week.   2.  Acute Systolic CHF: Mild fluid overload. Noted mostly by abdominal distention, PND and mild ankle edema. I will start her on low dose lasix 20 ng daily. Do not want to drop pre-load too much with AoV stenosis. Will check BMET and BNP. She is advised to call us for worsening breathing status, more fluid retention, or chest pain. She may need to be hospitalized for IV lasix vs more intensive therapy.   3. Hypertension: BP elevated today. Will only go up on Cozaar to 37.5 mg daily as I am adding lasix. I will see her in one week with follow up labs to ascertain her response to theses changes.   Current medicines are reviewed at length with the patient today.    Labs/ tests ordered today include: BMET and BNP.   Phill Myron. West Pugh, ANP, AACC   04/12/2018 12:31 PM    [facility] [address], [place], [place] [address] Phone: [phone]; Fax: [phone]

## 2018-04-12 ENCOUNTER — Encounter: Payer: Self-pay | Admitting: Adult Health

## 2018-04-12 ENCOUNTER — Ambulatory Visit (INDEPENDENT_AMBULATORY_CARE_PROVIDER_SITE_OTHER): Payer: Medicare Other | Admitting: Adult Health

## 2018-04-12 VITALS — BP 177/75 | HR 58 | Ht 67.5 in | Wt 223.4 lb

## 2018-04-12 DIAGNOSIS — Z79899 Other long term (current) drug therapy: Secondary | ICD-10-CM

## 2018-04-12 DIAGNOSIS — I5023 Acute on chronic systolic (congestive) heart failure: Secondary | ICD-10-CM | POA: Diagnosis not present

## 2018-04-12 DIAGNOSIS — I350 Nonrheumatic aortic (valve) stenosis: Secondary | ICD-10-CM

## 2018-04-12 DIAGNOSIS — I10 Essential (primary) hypertension: Secondary | ICD-10-CM | POA: Diagnosis not present

## 2018-04-12 MED ORDER — FUROSEMIDE 20 MG PO TABS: 20.0000 mg | ORAL_TABLET | Freq: Every day | ORAL | 1 refills | Status: DC

## 2018-04-12 MED ORDER — LOSARTAN POTASSIUM 25 MG PO TABS: 37.5000 mg | ORAL_TABLET | Freq: Every day | ORAL | 3 refills | Status: DC

## 2018-04-12 NOTE — Patient Instructions (Addendum)
Medication Instructions:  START FUROSEMIDE 20MG  DAILY  INCREASE LOSARTAN 37.5MG  (1-1/2 TAB) DAILY  If you need a refill on your cardiac medications before your next appointment, please call your pharmacy.  Labwork: BMET ON TUESDAY 04-16-2018  HERE IN OUR OFFICE AT [facility]  Take the provided lab slips with you to the lab for your blood draw.   You will NOT need to fast   Special Instructions: IF.Marland KitchenMarland KitchenYOUR WEIGHT DOES NOT DECREASE, YOUR BECOME SHORT-OF-BREATH-OR- YOU FEEL WORSE-GO DIRECTLY TO THE ER!!  Follow-Up: Your physician wants you to follow-up in: [place] ([place]), DNP,AACC IF PRIMARY CARDIOLOGIST IS UNAVAILABLE.    Thank you for choosing [facility] at [facility]!!

## 2018-04-18 ENCOUNTER — Ambulatory Visit (INDEPENDENT_AMBULATORY_CARE_PROVIDER_SITE_OTHER): Payer: Medicare Other | Admitting: Family Medicine

## 2018-04-18 ENCOUNTER — Encounter: Payer: Self-pay | Admitting: Family Medicine

## 2018-04-18 VITALS — BP 186/88 | HR 110 | Temp 97.9°F | Ht 67.5 in | Wt 214.0 lb

## 2018-04-18 DIAGNOSIS — I10 Essential (primary) hypertension: Secondary | ICD-10-CM

## 2018-04-18 DIAGNOSIS — N183 Chronic kidney disease, stage 3 unspecified: Secondary | ICD-10-CM

## 2018-04-18 DIAGNOSIS — E1122 Type 2 diabetes mellitus with diabetic chronic kidney disease: Secondary | ICD-10-CM

## 2018-04-18 DIAGNOSIS — I350 Nonrheumatic aortic (valve) stenosis: Secondary | ICD-10-CM | POA: Diagnosis not present

## 2018-04-18 DIAGNOSIS — I4891 Unspecified atrial fibrillation: Secondary | ICD-10-CM

## 2018-04-18 DIAGNOSIS — E1151 Type 2 diabetes mellitus with diabetic peripheral angiopathy without gangrene: Secondary | ICD-10-CM

## 2018-04-18 DIAGNOSIS — I739 Peripheral vascular disease, unspecified: Secondary | ICD-10-CM

## 2018-04-18 DIAGNOSIS — R7989 Other specified abnormal findings of blood chemistry: Secondary | ICD-10-CM

## 2018-04-18 DIAGNOSIS — E7800 Pure hypercholesterolemia, unspecified: Secondary | ICD-10-CM | POA: Diagnosis not present

## 2018-04-18 DIAGNOSIS — Z9884 Bariatric surgery status: Secondary | ICD-10-CM | POA: Diagnosis not present

## 2018-04-18 LAB — BAYER DCA HB A1C WAIVED: HB A1C (BAYER DCA - WAIVED): 9.2 % — ABNORMAL HIGH (ref <7.0)

## 2018-04-18 MED ORDER — FUROSEMIDE 20 MG PO TABS: 20.0000 mg | ORAL_TABLET | Freq: Two times a day (BID) | ORAL | 3 refills | Status: DC

## 2018-04-18 NOTE — Progress Notes (Signed)
Subjective:    Patient ID: [name], female    DOB: 01-12-1951, 67 y.o.   MRN: [id number]  HPI Pt here for follow up and management of chronic medical problems which includes hypertension, diabetes and hyperlipidemia. She is taking medication regularly.  Everything is moving forward for this patient to have a total aortic valve replacement by Dr. Mohammed Kindle.  She has a follow-up visit soon with Dr. Percival Spanish her cardiologist.  She has questions today about the Lasix.  Her initial blood pressure was 190/95 and on repeat was 186/88.  She has a history of aortic valve stenosis which seems to be getting worse.  Recently she has had more trouble with her blood pressure and increasing problems with edema.  She comes to the visit today with her husband.  Her biggest complaint is the shortness of breath and she thinks this is getting worse.  It is also worse at nighttime with laying and better with sitting.  There is also shortness of breath with activity but it is worse with trying to lay down.  She has minimal chest pain.  She has occasional problems with vomiting but attributes this to the sleeve surgery she has had for her stomach and secondary to overeating.  Otherwise no nausea diarrhea blood in the stool black tarry bowel movements or change in bowel habits.  She is passing her water well and tries to drink plenty of water and fluids.  She is pleasant and alert and fully aware of her medical condition and monitors her sugars regularly at home and she says fasting they run between 65 and 130 but in the evening they may run as high as 200-300.  He checks her blood pressures regularly at home and says these usually run high and in the 180s over the 80 range at home.  She is currently only taking 20 mg of Lasix daily.   Patient Active Problem List   Diagnosis Date Noted  . Severe aortic stenosis   . Non-STEMI (non-ST elevated myocardial infarction) [facility]): troponin increased to ~with chest pain in setting  of new onset A. fib.  7 02/15/2018  . Atrial flutter with rapid ventricular response ([place]) 02/15/2018  . Atrial fibrillation/flutter, new onset ([place]) 02/14/2018  . Diabetic retinopathy ([place]) 09/17/2017  . Moderate aortic stenosis by prior echocardiogram 02/13/2017  . Palpitations 02/13/2017  . Memory difficulty 11/26/2015  . Benign neoplasm of sigmoid colon 07/14/2015  . Low serum vitamin D 05/06/2015  . Vertigo 05/06/2015  . Aphasia 12/08/2014  . TIA (transient ischemic attack), history of 11/07/2014  . 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 CKD stage 3 due to type 2 diabetes mellitus ([place]) 06/03/2014  . Type II diabetes mellitus with renal manifestations ([place]) 06/03/2014  . Type II diabetes mellitus with peripheral circulatory disorder ([place]) 06/03/2014  . Obesity (BMI 30.0-34.9) 04/24/2014  . Dizziness and giddiness 04/14/2014  . 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 Osteopenia 10/09/2013  . Abnormal ankle brachial index 09/11/2013  . Hypomagnesemia 08/01/2013  . DYSPNEA 08/18/2010  . MYOCARDIAL PERFUSION SCAN, WITH STRESS TEST, ABNORMAL 08/04/2010  . ELECTROCARDIOGRAM, ABNORMAL 07/28/2010  . Edema extremities 12/30/2008  . Hyperlipidemia associated with type 2 diabetes mellitus ([place]) 12/26/2008  . Benign essential HTN 12/26/2008  . Coronary artery disease, non-occlusive 12/26/2008  . PERICARDIAL EFFUSION 12/26/2008  . GERD 12/26/2008  . OSTEOARTHRITIS 12/26/2008   Outpatient Encounter Medications as of 04/18/2018  Medication Sig  . ACCU-CHEK AVIVA PLUS test strip USE TO  CHECK BLOOD SUGAR UP TO TWICE DAILY OR AS INSTRUCTED  . amiodarone (PACERONE) 200 MG tablet Take 1 tablet (200 mg total) by mouth daily.  Marland Kitchen apixaban (ELIQUIS) 5 MG TABS tablet Take 1 tablet (5 mg total) by mouth 2 (two) times daily.  . B-D INS SYR ULTRAFINE 1CC/31G 31G X 5/16" 1 ML MISC USE TO INJECT INSULIN TWICE A DAY AS INSTRUCTED (Patient taking differently: 1 Stick by Other route 2 (two)  times daily. )  . Calcium Carbonate-Vitamin D (CALCIUM 600+D) 600-400 MG-UNIT per tablet Take 1 tablet by mouth 2 (two) times daily. (Patient taking differently: Take 1 tablet by mouth daily. )  . Cholecalciferol (VITAMIN D) 2000 units CAPS Take 2,000 Units by mouth daily.   . furosemide (LASIX) 20 MG tablet Take 1 tablet (20 mg total) by mouth daily.  . Insulin Pen Needle 32G X 4 MM MISC Use to inject insulin with insulin pen  . LIVALO 4 MG TABS TAKE 1 TABLET BY MOUTH ONCE DAILY  . losartan (COZAAR) 25 MG tablet Take 1.5 tablets (37.5 mg total) by mouth daily.  . meclizine (ANTIVERT) 12.5 MG tablet Take 1 tablet (12.5 mg total) by mouth 3 (three) times daily as needed for dizziness.  . metFORMIN (GLUCOPHAGE) 1000 MG tablet TAKE 1 TABLET BY MOUTH 2 TIMES A DAY  . metoprolol tartrate (LOPRESSOR) 25 MG tablet Take 1 tablet (25 mg total) by mouth 2 (two) times daily.  . Multiple Vitamin (MULTIVITAMIN WITH MINERALS) TABS tablet Take 1 tablet by mouth daily.   Marland Kitchen NOVOLIN 70/30 RELION (70-30) 100 UNIT/ML injection Inject 15-65 Units into the skin See admin instructions. Taking 65 units with breakfast and 15 units at lunch and 65 units with dinner.  . ondansetron (ZOFRAN ODT) 4 MG disintegrating tablet Take 1 tablet (4 mg total) by mouth every 8 (eight) hours as needed for nausea.  . Propylene Glycol (SYSTANE COMPLETE) 0.6 % SOLN Apply 1 drop to eye daily as needed (dry eyes).   . sodium chloride (OCEAN) 0.65 % SOLN nasal spray Place 1 spray into both nostrils as needed for congestion.  . vitamin E 400 UNIT capsule Take 400 Units by mouth daily.  . [DISCONTINUED] Olmesartan-Amlodipine-HCTZ (TRIBENZOR) 40-5-12.5 MG TABS Take 1 tablet by mouth daily.     No facility-administered encounter medications on file as of 04/18/2018.       Review of Systems  Constitutional: Negative.   HENT: Negative.   Eyes: Negative.   Respiratory: Negative.   Cardiovascular: Negative.   Gastrointestinal: Negative.     Endocrine: Negative.   Genitourinary: Negative.   Musculoskeletal: Negative.   Skin: Negative.   Allergic/Immunologic: Negative.   Neurological: Negative.   Hematological: Negative.   Psychiatric/Behavioral: Negative.        Objective:   Physical Exam  Constitutional: She is oriented to person, place, and time. She appears well-developed and well-nourished. No distress.  The patient is pleasant calm and alert and well informed about her personal health issues and monitors her blood pressures and blood sugars regularly at home.  HENT:  Head: Normocephalic and atraumatic.  Right Ear: External ear normal.  Left Ear: External ear normal.  Nose: Nose normal.  Mouth/Throat: Oropharynx is clear and moist. No oropharyngeal exudate.  Eyes: Pupils are equal, round, and reactive to light. Conjunctivae and EOM are normal. Right eye exhibits no discharge. Left eye exhibits no discharge. No scleral icterus.  Neck: Normal range of motion. Neck supple. No thyromegaly present.  Cardiovascular: Normal rate  and regular rhythm.  Murmur heard. The heart is regular at 96/min with a grade 3/6 systolic ejection murmur pedal pulses in the left foot were diminished compared to the right foot.  Pulmonary/Chest: Effort normal and breath sounds normal. She has no wheezes. She has no rales.  Clear anteriorly and posteriorly  Abdominal: Soft. Bowel sounds are normal. She exhibits no mass. There is no tenderness.  Abdominal obesity with a right upper quadrant scar secondary to gastric bypass surgery.  No apparent organ enlargement masses or bruits.  Musculoskeletal: Normal range of motion. She exhibits edema and deformity. She exhibits no tenderness.  The patient has had a left great toe amputated.  There is slight edema in the left pretibial area and dorsal foot area on the side.  Lymphadenopathy:    She has no cervical adenopathy.  Neurological: She is alert and oriented to person, place, and time. She has  normal reflexes. No cranial nerve deficit.  The patient is alert and oriented  Skin: Skin is warm and dry. No rash noted.  Skin is dry generally speaking.  Psychiatric: She has a normal mood and affect. Her behavior is normal. Judgment and thought content normal.  The patient's mood affect and behavior were normal.  Nursing note and vitals reviewed.  BP (!) 186/88   Pulse (!) 110   Temp 97.9 F (36.6 C) (Oral)   Ht 5' 7.5" (1.715 m)   Wt 214 lb (97.1 kg)   BMI 33.02 kg/m         Assessment & Plan:  1. Benign essential HTN -The blood pressure is elevated today.  The second repeat was still elevated and the patient says this is similar to the readings she is getting at home.  We will increase her Lasix to 10 in the afternoon and after a few days she will go up to 20 if the edema continues.  She will be following up with a cardiologist in 1 week. - BMP8+EGFR - CBC with Differential/Platelet - Hepatic function panel  2. Type II diabetes mellitus with peripheral circulatory disorder (HCC) -The blood sugars have been running good fasting but higher in the afternoon and she will continue to monitor these regularly at home. - BMP8+EGFR - CBC with Differential/Platelet - Bayer DCA Hb A1c Waived  3. Chronic kidney disease, stage 3 (moderate) (HCC) -T need to avoid all NSAIDs - CBC with Differential/Platelet  4. Pure hypercholesterolemia -Continue aggressive treatment pending results of lab work - CBC with Differential/Platelet - Lipid panel  5. Low serum vitamin D -Continue vitamin D replacement pending results of lab work - CBC with Differential/Platelet - VITAMIN D 25 Hydroxy (Vit-D Deficiency, Fractures)  6. History of gastric bypass - CBC with Differential/Platelet  7. Nonrheumatic aortic valve stenosis -Follow-up with Dr. Mohammed Kindle and with cardiology as planned  8. Atrial fibrillation, unspecified type ([place]) -The patient was in normal sinus rhythm today at  96/min  9. CKD stage 3 due to type 2 diabetes mellitus (HCC) -Check BMP  10. Peripheral artery disease ([place]) -Patient has diabetes and diminished pulses in left foot.  Patient Instructions                       Medicare Annual Wellness Visit  [facility] and the medical providers at [facility] strive to bring you the best medical care.  In doing so we not only want to address your current medical conditions and concerns but also to detect new  conditions early and prevent illness, disease and health-related problems.    Medicare offers a yearly Wellness Visit which allows our clinical staff to assess your need for preventative services including immunizations, lifestyle education, counseling to decrease risk of preventable diseases and screening for fall risk and other medical concerns.    This visit is provided free of charge (no copay) for all Medicare recipients. The clinical pharmacists at [facility] have begun to conduct these Wellness Visits which will also include a thorough review of all your medications.    As you primary medical provider recommend that you make an appointment for your Annual Wellness Visit if you have not done so already this year.  You may set up this appointment before you leave today or you may call back ([phone]) and schedule an appointment.  Please make sure when you call that you mention that you are scheduling your Annual Wellness Visit with the clinical pharmacist so that the appointment may be made for the proper length of time.     Continue current medications. Continue good therapeutic lifestyle changes which include good diet and exercise. Fall precautions discussed with patient. If an FOBT was given today- please return it to our front desk. If you are over 104 years old - you may need Prevnar 26 or the adult Pneumonia vaccine.  **Flu shots are available--- please call and schedule a FLU-CLINIC  appointment**  After your visit with [place] today you will receive a survey in the mail or online from [employer] regarding your care with [place]. Please take a moment to fill this out. Your feedback is very important to [place] as you can help [place] better understand your patient needs as well as improve your experience and satisfaction. WE CARE ABOUT YOU!!!   Follow-up with cardiology and cardiovascular surgery as planned Take an extra half of the Lasix at 4:00 in the afternoon and if by the weekend you cannot tell that much difference with your breathing go on and increase this to a whole Lasix or 20 mg in the afternoon around 4:00 also. Continue to drink water and watch sodium intake Always keep the feet checked closely.  Arrie Senate MD

## 2018-04-18 NOTE — Patient Instructions (Addendum)
Medicare Annual Wellness Visit  [facility] and the medical providers at [facility] strive to bring you the best medical care.  In doing so we not only want to address your current medical conditions and concerns but also to detect new conditions early and prevent illness, disease and health-related problems.    Medicare offers a yearly Wellness Visit which allows our clinical staff to assess your need for preventative services including immunizations, lifestyle education, counseling to decrease risk of preventable diseases and screening for fall risk and other medical concerns.    This visit is provided free of charge (no copay) for all Medicare recipients. The clinical pharmacists at [facility] have begun to conduct these Wellness Visits which will also include a thorough review of all your medications.    As you primary medical provider recommend that you make an appointment for your Annual Wellness Visit if you have not done so already this year.  You may set up this appointment before you leave today or you may call back ([phone]) and schedule an appointment.  Please make sure when you call that you mention that you are scheduling your Annual Wellness Visit with the clinical pharmacist so that the appointment may be made for the proper length of time.     Continue current medications. Continue good therapeutic lifestyle changes which include good diet and exercise. Fall precautions discussed with patient. If an FOBT was given today- please return it to our front desk. If you are over 32 years old - you may need Prevnar 34 or the adult Pneumonia vaccine.  **Flu shots are available--- please call and schedule a FLU-CLINIC appointment**  After your visit with [place] today you will receive a survey in the mail or online from [employer] regarding your care with [place]. Please take a moment to fill this out. Your feedback is very  important to [place] as you can help [place] better understand your patient needs as well as improve your experience and satisfaction. WE CARE ABOUT YOU!!!   Follow-up with cardiology and cardiovascular surgery as planned Take an extra half of the Lasix at 4:00 in the afternoon and if by the weekend you cannot tell that much difference with your breathing go on and increase this to a whole Lasix or 20 mg in the afternoon around 4:00 also. Continue to drink water and watch sodium intake Always keep the feet checked closely.

## 2018-04-18 NOTE — Addendum Note (Signed)
Addended by: Zannie Cove on: 04/18/2018 10:43 AM   Modules accepted: Orders

## 2018-04-19 ENCOUNTER — Ambulatory Visit (HOSPITAL_COMMUNITY): Payer: Medicare Other

## 2018-04-19 ENCOUNTER — Ambulatory Visit (HOSPITAL_COMMUNITY)
Admission: RE | Admit: 2018-04-19 | Discharge: 2018-04-19 | Disposition: A | Discharge status: Home / Self Care | Payer: Medicare Other | Source: Ambulatory Visit | Attending: Physician Assistant | Admitting: Physician Assistant

## 2018-04-19 ENCOUNTER — Encounter (HOSPITAL_COMMUNITY): Payer: Medicare Other

## 2018-04-19 ENCOUNTER — Encounter (HOSPITAL_COMMUNITY): Payer: Self-pay

## 2018-04-19 ENCOUNTER — Ambulatory Visit (HOSPITAL_COMMUNITY)
Admit: 2018-04-19 | Discharge: 2018-04-19 | Disposition: A | Discharge status: Home / Self Care | Payer: Medicare Other | Attending: Physician Assistant | Admitting: Physician Assistant

## 2018-04-19 DIAGNOSIS — R918 Other nonspecific abnormal finding of lung field: Secondary | ICD-10-CM | POA: Diagnosis not present

## 2018-04-19 DIAGNOSIS — I517 Cardiomegaly: Secondary | ICD-10-CM | POA: Insufficient documentation

## 2018-04-19 DIAGNOSIS — J449 Chronic obstructive pulmonary disease, unspecified: Secondary | ICD-10-CM | POA: Diagnosis not present

## 2018-04-19 DIAGNOSIS — I2510 Atherosclerotic heart disease of native coronary artery without angina pectoris: Secondary | ICD-10-CM | POA: Diagnosis not present

## 2018-04-19 DIAGNOSIS — I358 Other nonrheumatic aortic valve disorders: Secondary | ICD-10-CM | POA: Insufficient documentation

## 2018-04-19 DIAGNOSIS — I350 Nonrheumatic aortic (valve) stenosis: Secondary | ICD-10-CM | POA: Diagnosis not present

## 2018-04-19 DIAGNOSIS — I700 Atherosclerosis of aorta: Secondary | ICD-10-CM | POA: Diagnosis not present

## 2018-04-19 LAB — BMP8+EGFR
BUN/Creatinine Ratio: 17 (ref 12–28)
BUN: 24 mg/dL (ref 8–27)
CO2: 25 mmol/L (ref 20–29)
Calcium: 9.2 mg/dL (ref 8.7–10.3)
Chloride: 98 mmol/L (ref 96–106)
Creatinine, Ser: 1.41 mg/dL — ABNORMAL HIGH (ref 0.57–1.00)
GFR calc Af Amer: 44 mL/min/{1.73_m2} — ABNORMAL LOW (ref >59)
GFR calc non Af Amer: 39 mL/min/{1.73_m2} — ABNORMAL LOW (ref >59)
Glucose: 361 mg/dL — ABNORMAL HIGH (ref 65–99)
Potassium: 4.3 mmol/L (ref 3.5–5.2)
Sodium: 142 mmol/L (ref 134–144)

## 2018-04-19 LAB — CBC WITH DIFFERENTIAL/PLATELET
Basophils Absolute: 0 10*3/uL (ref 0.0–0.2)
Basos: 0 %
EOS (ABSOLUTE): 0.2 10*3/uL (ref 0.0–0.4)
Eos: 3 %
Hematocrit: 40.2 % (ref 34.0–46.6)
Hemoglobin: 13 g/dL (ref 11.1–15.9)
Immature Grans (Abs): 0 10*3/uL (ref 0.0–0.1)
Immature Granulocytes: 0 %
Lymphocytes Absolute: 1.4 10*3/uL (ref 0.7–3.1)
Lymphs: 15 %
MCH: 28 pg (ref 26.6–33.0)
MCHC: 32.3 g/dL (ref 31.5–35.7)
MCV: 87 fL (ref 79–97)
Monocytes Absolute: 0.8 10*3/uL (ref 0.1–0.9)
Monocytes: 9 %
Neutrophils Absolute: 6.6 10*3/uL (ref 1.4–7.0)
Neutrophils: 73 %
Platelets: 250 10*3/uL (ref 150–450)
RBC: 4.64 x10E6/uL (ref 3.77–5.28)
RDW: 14.8 % (ref 12.3–15.4)
WBC: 9.1 10*3/uL (ref 3.4–10.8)

## 2018-04-19 LAB — HEPATIC FUNCTION PANEL
ALT: 15 IU/L (ref 0–32)
AST: 10 IU/L (ref 0–40)
Albumin: 3.8 g/dL (ref 3.6–4.8)
Alkaline Phosphatase: 78 IU/L (ref 39–117)
Bilirubin Total: 0.8 mg/dL (ref 0.0–1.2)
Bilirubin, Direct: 0.26 mg/dL (ref 0.00–0.40)
Total Protein: 6.2 g/dL (ref 6.0–8.5)

## 2018-04-19 LAB — PULMONARY FUNCTION TEST
DL/VA % pred: 100 %
DL/VA: 5.19 ml/min/mmHg/L
DLCO unc % pred: 49 %
DLCO unc: 14.12 ml/min/mmHg
FEF 25-75 Post: 2.27 L/sec
FEF 25-75 Pre: 1.92 L/sec
FEF2575-%Change-Post: 18 %
FEF2575-%Pred-Post: 103 %
FEF2575-%Pred-Pre: 87 %
FEV1-%Change-Post: 2 %
FEV1-%Pred-Post: 56 %
FEV1-%Pred-Pre: 54 %
FEV1-Post: 1.49 L
FEV1-Pre: 1.45 L
FEV1FVC-%Change-Post: 3 %
FEV1FVC-%Pred-Pre: 112 %
FEV6-%Change-Post: 0 %
FEV6-%Pred-Post: 49 %
FEV6-%Pred-Pre: 50 %
FEV6-Post: 1.66 L
FEV6-Pre: 1.67 L
FEV6FVC-%Pred-Post: 104 %
FEV6FVC-%Pred-Pre: 104 %
FVC-%Change-Post: 0 %
FVC-%Pred-Post: 47 %
FVC-%Pred-Pre: 48 %
FVC-Post: 1.66 L
FVC-Pre: 1.67 L
Post FEV1/FVC ratio: 90 %
Post FEV6/FVC ratio: 100 %
Pre FEV1/FVC ratio: 87 %
Pre FEV6/FVC Ratio: 100 %
RV % pred: 67 %
RV: 1.55 L
TLC % pred: 60 %
TLC: 3.33 L

## 2018-04-19 LAB — LIPID PANEL
Chol/HDL Ratio: 3.9 ratio (ref 0.0–4.4)
Cholesterol, Total: 217 mg/dL — ABNORMAL HIGH (ref 100–199)
HDL: 55 mg/dL (ref >39)
LDL Calculated: 126 mg/dL — ABNORMAL HIGH (ref 0–99)
Triglycerides: 178 mg/dL — ABNORMAL HIGH (ref 0–149)
VLDL Cholesterol Cal: 36 mg/dL (ref 5–40)

## 2018-04-19 LAB — VITAMIN D 25 HYDROXY (VIT D DEFICIENCY, FRACTURES): Vit D, 25-Hydroxy: 25.4 ng/mL — ABNORMAL LOW (ref 30.0–100.0)

## 2018-04-19 MED ORDER — ALBUTEROL SULFATE (2.5 MG/3ML) 0.083% IN NEBU
2.5000 mg | INHALATION_SOLUTION | Freq: Once | RESPIRATORY_TRACT | Status: AC
  Administered 2018-04-19: 2.5 mg via RESPIRATORY_TRACT

## 2018-04-19 MED ORDER — METOPROLOL TARTRATE 5 MG/5ML IV SOLN
10.0000 mg | INTRAVENOUS | Status: DC | PRN
  Administered 2018-04-19: 10 mg via INTRAVENOUS
  Filled 2018-04-19: qty 10

## 2018-04-19 MED ORDER — SODIUM BICARBONATE 8.4 % IV SOLN
INTRAVENOUS | Status: AC
  Administered 2018-04-19: 13:00:00 via INTRAVENOUS
  Filled 2018-04-19: qty 500

## 2018-04-19 MED ORDER — IOPAMIDOL (ISOVUE-370) INJECTION 76%
100.0000 mL | Freq: Once | INTRAVENOUS | Status: AC | PRN
  Administered 2018-04-19: 100 mL via INTRAVENOUS

## 2018-04-19 MED ORDER — METOPROLOL TARTRATE 5 MG/5ML IV SOLN
INTRAVENOUS | Status: AC
  Administered 2018-04-19: 10 mg via INTRAVENOUS
  Filled 2018-04-19: qty 10

## 2018-04-19 MED ORDER — SODIUM BICARBONATE BOLUS VIA INFUSION
INTRAVENOUS | Status: AC
  Administered 2018-04-19: 75 meq via INTRAVENOUS
  Filled 2018-04-19: qty 1

## 2018-04-22 NOTE — Progress Notes (Addendum)
Cardiology Office Note   Date:  04/24/2018   ID:  [name], [name] 1951/07/10, MRN [id number]  PCP:  Chipper Herb, MD  Cardiologist: [facility]  Chief Complaint  Patient presents with  . Follow-up  . Congestive Heart Failure  . Aortic Stenosis     History of Present Illness: [name] is a 67 y.o. female who presents for ongoing assessment and management of aortic valve stenosis, plan for pre-TAVR work up. She had uncontrolled hypertension and was seen last on 04/12/2018 with elevated BP. Dyspnea, and volume overload. [place] recent echo revealed normal LV fx with calcified AoV with restricted leaflet motion, especially the non-coronary appeared immobile with severe stenosis. She is being seen by CVTS.   Due to volume overload she was started on low dose lasix 20 mg daily and Cozaar was increased to 37.5 mg daily. She is here for close follow up. In the interim she has been seen by Dr. Laurance Flatten on 04/17/2018 and was continued to be dyspneic and having LEE.  Lasix was increased to 20 mg in the am and 10 mg in the pm. Follow up labs were ordered. She has been seen by TAVR team and has been ordered PFT's   She is feeling better with increased dose of lasix. She continues to have mild dependent edema but breathing status has improved. PFT's were completed. She is due to have follow with TAVR team next week.   Past Medical History:  Diagnosis Date  . Abnormal Doppler ultrasound of carotid artery    a. 05/2013 - mild plaque, 1-39% BICA, abnormal appearrance of thyroid (TSH wnl in 06/2013).  Marland Kitchen CAD (coronary artery disease)    a. Nonobstructive by cath 2006 - 25% LAD, 25-30% after 1st diag, distal 25% PDA, minor irreg LCx. b. Normal nuc 2011.  . CKD (chronic kidney disease), stage III ([place])   . Cystocele   . Difficult intubation 09-07-2012   big neck trouble with intubation parotid surgery"takes little med to sedate"  . Dyslipidemia   . Dysrhythmia    hx a fib  . Esophageal stricture     . GERD (gastroesophageal reflux disease)   . Hiatal hernia    a. 07/2013: HH with small stricture holding up barium tablet (concides with patient's sx of food sticking).  . History of kidney stones   . Hyperlipidemia   . Hypertension    dr Percival Spanish  . IBS (irritable bowel syndrome)   . Memory difficulty 11/26/2015  . Morbid obesity ([place])   . Osteoarthritis    "all over" (02/14/2018)  . Osteopenia   . Osteoporosis   . Palpitations    a. PVC/PAC on EKG 08/2013.  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  Marland Kitchen Pericardial effusion    a. Mod by echo 2012 at time of PNA. b. Echo 07/2012: small pericardial effusion vs fat.  Marland Kitchen PONV (postoperative nausea and vomiting)    n/v, and room spinning after cataract and parotid surgery  . Sleep apnea     "have mask; don't use it" (02/14/2018)  . Superficial phlebitis   . TIA (transient ischemic attack) 10/2014; 06/2016   "some memory issues since; daughter says my speech is sometimes different" (02/14/2018)  . Type II diabetes mellitus ([place])     Past Surgical History:  Procedure Laterality Date  . BREATH TEK H PYLORI N/A 07/29/2013   Procedure: BREATH TEK H PYLORI;  Surgeon: Shann Medal, MD;  [facility];  Service: General;  Laterality: N/A;  . CARDIAC CATHETERIZATION  2006  . CARDIOVERSION N/A 02/16/2018   Procedure: CARDIOVERSION;  Surgeon: Pixie Casino, MD;  Location: [place];  Service: Cardiovascular;  Laterality: N/A;  . CARPAL TUNNEL RELEASE Left 2011  . CATARACT EXTRACTION W/ INTRAOCULAR LENS  IMPLANT, BILATERAL Bilateral 2003   left  . CHOLECYSTECTOMY OPEN  1982  . COLONOSCOPY N/A 07/14/2015   Procedure: COLONOSCOPY;  Surgeon: Ladene Artist, MD;  [facility];  Service: Endoscopy;  Laterality: N/A;  . ESOPHAGOGASTRODUODENOSCOPY N/A 12/27/2013   Procedure: ESOPHAGOGASTRODUODENOSCOPY (EGD);  Surgeon: Shann Medal, MD;  Location: [facility];  Service: General;  Laterality: N/A;  .  ESOPHAGOGASTRODUODENOSCOPY (EGD) WITH ESOPHAGEAL DILATION    . LAPAROSCOPIC GASTRIC SLEEVE RESECTION N/A 01/20/2014   Procedure: LAPAROSCOPIC GASTRIC SLEEVE RESECTION;  Surgeon: Shann Medal, MD;  [facility];  Service: General;  Laterality: N/A;  . PAROTIDECTOMY  09/07/2012   Procedure: PAROTIDECTOMY;  Surgeon: Melida Quitter, MD;  Location: [place];  Service: ENT;  Laterality: Left;  LEFT PAROTIDECTOMY  . RIGHT/LEFT HEART CATH AND CORONARY ANGIOGRAPHY N/A 03/29/2018   Procedure: RIGHT/LEFT HEART CATH AND CORONARY ANGIOGRAPHY;  Surgeon: Burnell Blanks, MD;  Location: [place] [facility];  Service: Cardiovascular;  Laterality: N/A;  . TEE WITHOUT CARDIOVERSION N/A 02/16/2018   Procedure: TRANSESOPHAGEAL ECHOCARDIOGRAM (TEE);  Surgeon: Pixie Casino, MD;  Location: [facility] END[facility]COPY;  Service: Cardiovascular;  Laterality: N/A;  . TOE AMPUTATION Left 07/2017   great toe; [facility]   . TONSILLECTOMY  1968  . TUBAL LIGATION  yrs ago  . UPPER GI ENDOSCOPY  01/20/2014   Procedure: UPPER GI ENDOSCOPY;  Surgeon: Shann Medal, MD;  [facility];  Service: General;;     Current Outpatient Medications  Medication Sig Dispense Refill  . ACCU-CHEK AVIVA PLUS test strip USE TO CHECK BLOOD SUGAR UP TO TWICE DAILY OR AS INSTRUCTED 100 each 2  . amiodarone (PACERONE) 200 MG tablet Take 1 tablet (200 mg total) by mouth daily. 90 tablet 3  . apixaban (ELIQUIS) 5 MG TABS tablet Take 1 tablet (5 mg total) by mouth 2 (two) times daily. 180 tablet 3  . B-D INS SYR ULTRAFINE 1CC/31G 31G X 5/16" 1 ML MISC USE TO INJECT INSULIN TWICE A DAY AS INSTRUCTED (Patient taking differently: 1 Stick by Other route 3 (three) times daily before meals. ) 100 each 2  . Calcium Carbonate-Vitamin D (CALCIUM 600+D) 600-400 MG-UNIT per tablet Take 1 tablet by mouth 2 (two) times daily. (Patient taking differently: Take 1 tablet by mouth daily. )    . Cholecalciferol (VITAMIN D) 2000 units CAPS Take 2,000 Units by mouth daily.       . furosemide (LASIX) 20 MG tablet Take 1 tablet (20 mg total) by mouth 2 (two) times daily. 60 tablet 3  . Insulin Pen Needle 32G X 4 MM MISC Use to inject insulin with insulin pen 100 each 4  . LIVALO 4 MG TABS TAKE 1 TABLET BY MOUTH ONCE DAILY 90 tablet 1  . losartan (COZAAR) 25 MG tablet Take 1.5 tablets (37.5 mg total) by mouth daily. 90 tablet 3  . meclizine (ANTIVERT) 12.5 MG tablet Take 1 tablet (12.5 mg total) by mouth 3 (three) times daily as needed for dizziness. 30 tablet 1  . metFORMIN (GLUCOPHAGE) 1000 MG tablet TAKE 1 TABLET BY MOUTH 2 TIMES A DAY 180 tablet 0  . metoprolol tartrate (LOPRESSOR) 25 MG tablet Take 1 tablet (25  mg total) by mouth 2 (two) times daily. 60 tablet 6  . Multiple Vitamin (MULTIVITAMIN WITH MINERALS) TABS tablet Take 1 tablet by mouth daily.     Marland Kitchen NOVOLIN 70/30 RELION (70-30) 100 UNIT/ML injection Inject 15-65 Units into the skin See admin instructions. Taking 65 units with breakfast and 15 units at lunch and 65 units with dinner.    . ondansetron (ZOFRAN ODT) 4 MG disintegrating tablet Take 1 tablet (4 mg total) by mouth every 8 (eight) hours as needed for nausea. 10 tablet 0  . Propylene Glycol (SYSTANE COMPLETE) 0.6 % SOLN Apply 1 drop to eye daily as needed (dry eyes).     . sodium chloride (OCEAN) 0.65 % SOLN nasal spray Place 1 spray into both nostrils as needed for congestion. 15 mL 0  . vitamin E 400 UNIT capsule Take 400 Units by mouth daily.    Marland Kitchen albuterol (VENTOLIN HFA) 108 (90 Base) MCG/ACT inhaler Inhale 1 puff into the lungs 2 (two) times daily as needed for shortness of breath. 1 Inhaler 0   No current facility-administered medications for this visit.     Allergies:   Atorvastatin; Crestor [rosuvastatin calcium]; Ezetimibe-simvastatin; Celebrex [celecoxib]; Levemir [insulin detemir]; and Penicillins    Social History:  The patient  reports that she has never smoked. She has never used smokeless tobacco. She reports that she drinks alcohol.  She reports that she does not use drugs.   Family History:  The patient's family history includes Colitis in her mother; Colon polyps in her father; Diabetes in her father, maternal grandfather, mother, and other; Heart attack in her sister; Heart disease in her father; Heart failure in her father and mother; Hypertension in her mother; Kidney Stones in her mother; Skin cancer in her mother; Stroke in her maternal grandfather and mother.    ROS: All other systems are reviewed and negative. Unless otherwise mentioned in H&P    PHYSICAL EXAM: VS:  BP 112/62   Pulse 98   Ht 5\' 7"  (1.702 m)   Wt 217 lb 6.4 oz (98.6 kg)   BMI 34.05 kg/m  , BMI Body mass index is 34.05 kg/m. GEN: Well nourished, well developed, in no acute distress  HEENT: normal  Neck: no JVD, carotid bruits, or masses Cardiac: RRR; 3/6 systolic murmurs, rubs, or gallops, mild dependent edema  Respiratory: Cl[place]ar to auscultation bilaterally, normal work of breathing GI: soft, nontender, nondistended, + BS MS: no deformity or atrophy  Skin: warm and dry, no rash Neuro:  Strength and sensation are intact Psych: euthymic mood, full affect   EKG:  NSR with marked ST depression in I, II and AvL.  Recent Labs: 02/14/2018: TSH 0.780 02/18/2018: Magnesium 1.8 03/13/2018: BNP 163.3 04/18/2018: ALT 15; BUN 24; Creatinine, Ser 1.41; Hemoglobin 13.0; Platelets 250; Potassium 4.3; Sodium 142    Lipid Panel    Component Value Date/Time   CHOL 217 (H) 04/18/2018 0942   CHOL 286 (H) 01/24/2013 1100   TRIG 178 (H) 04/18/2018 0942   TRIG 200 (H) 06/03/2014 1132   TRIG 468 (H) 01/24/2013 1100   HDL 55 04/18/2018 0942   HDL 43 06/03/2014 1132   HDL 47 01/24/2013 1100   CHOLHDL 3.9 04/18/2018 0942   CHOLHDL 3.7 02/15/2018 0239   VLDL 44 (H) 02/15/2018 0239   LDLCALC 126 (H) 04/18/2018 0942   LDLCALC 101 (H) 06/03/2014 1132   LDLCALC 145 (H) 01/24/2013 1100   LDLDIRECT 132 (H) 05/09/2016 0814   LDLDIRECT 113 (H) 02/26/2013  0845        Wt Readings from Last 3 Encounters:  04/24/18 217 lb 6.4 oz (98.6 kg)  04/18/18 214 lb (97.1 kg)  04/12/18 223 lb 6.4 oz (101.3 kg)      Other studies Reviewed: Echocardiogram Left ventricle: Severe LVH. Small LV cavity. Systolic function   was vigorous. The estimated ejection fraction was in the range of   65% to 70%. Wall motion was normal; there were no regional wall   motion abnormalities. - Aortic valve: Calcified with restricted leaflet motion,   especially the non-coronary cusp which appears immobile. Severe   stenosis. Mean gradient (S): 32 mm Hg. Peak gradient (S): 57 mm   Hg. Valve area (VTI): 0.9 cm^2. Valve area (Vmax): 0.92 cm^2.   Valve area (Vmean): 0.7 cm^2. - Mitral valve: Mildly calcified annulus. Sclerotic leafelts,   possibly rheumatic. Mild regurgitation. - Left atrium: The atrium was dilated. No evidence of thrombus in   the atrial cavity or appendage. - Right atrium: The atrium was dilated. - Atrial septum: No defect or patent foramen ovale was identified. - Tricuspid valve: There was mild regurgitation. - Pericardium, extracardiac: Small pericardial effusion  ASSESSMENT AND PLAN:  1. Severe AoV stenosis: She is being followed by the TAVR team with ongoing testing and planned appointments with Dr. Burt Knack and Dr.Bartle. She is continuing to have some mild dyspnea but improved with increased dose of lasix. Continue current regimen.   PFT's are completed. She has evidence of severe restriction interstitially. Moderately severe Diffusion Deficit. She is given Rx for Proventil prn. (Her daughter is a respiratory therapist).   2.Hypertension: BP is well controlled. She continues on losartan. Use of lasix has helped with control, Only mild LEE noted on exam.   3. Paroxysmal Atrial fibrillation: She remains on Eliquis, amiodarone. She has mild LEE which is improved with po lasix.   4. Diabetes: Followed by PCP.    Current medicines are reviewed at  length with the patient today.    Labs/ tests ordered today include: None   Phill Myron. West Pugh, ANP, AACC   04/24/2018 1:40 PM    [facility] [address] [place], [place] [address] Phone: [phone]; Fax: [phone]

## 2018-04-24 ENCOUNTER — Encounter: Payer: Self-pay | Admitting: Adult Health

## 2018-04-24 ENCOUNTER — Ambulatory Visit (INDEPENDENT_AMBULATORY_CARE_PROVIDER_SITE_OTHER): Payer: Medicare Other | Admitting: Adult Health

## 2018-04-24 VITALS — BP 112/62 | HR 98 | Ht 67.0 in | Wt 217.4 lb

## 2018-04-24 DIAGNOSIS — I10 Essential (primary) hypertension: Secondary | ICD-10-CM

## 2018-04-24 DIAGNOSIS — I350 Nonrheumatic aortic (valve) stenosis: Secondary | ICD-10-CM | POA: Diagnosis not present

## 2018-04-24 DIAGNOSIS — I5032 Chronic diastolic (congestive) heart failure: Secondary | ICD-10-CM | POA: Diagnosis not present

## 2018-04-24 DIAGNOSIS — I482 Chronic atrial fibrillation: Secondary | ICD-10-CM | POA: Diagnosis not present

## 2018-04-24 DIAGNOSIS — I4821 Permanent atrial fibrillation: Secondary | ICD-10-CM

## 2018-04-24 MED ORDER — ALBUTEROL SULFATE HFA 108 (90 BASE) MCG/ACT IN AERS: 1.0000 | INHALATION_SPRAY | Freq: Two times a day (BID) | RESPIRATORY_TRACT | 0 refills | Status: DC | PRN

## 2018-04-24 NOTE — Patient Instructions (Signed)
Curt Bears NP has prescribed albuterol inhaler to use as needed  Please keep all scheduled appointments

## 2018-04-25 ENCOUNTER — Encounter: Payer: Self-pay | Admitting: Physical Therapy

## 2018-04-25 ENCOUNTER — Institutional Professional Consult (permissible substitution) (INDEPENDENT_AMBULATORY_CARE_PROVIDER_SITE_OTHER): Payer: Medicare Other | Admitting: Surgery

## 2018-04-25 ENCOUNTER — Ambulatory Visit: Payer: Medicare Other | Attending: Physician Assistant | Admitting: Physical Therapy

## 2018-04-25 VITALS — BP 168/83 | HR 100 | Resp 20 | Ht 67.0 in | Wt 218.0 lb

## 2018-04-25 DIAGNOSIS — I350 Nonrheumatic aortic (valve) stenosis: Secondary | ICD-10-CM | POA: Diagnosis not present

## 2018-04-25 DIAGNOSIS — R2689 Other abnormalities of gait and mobility: Secondary | ICD-10-CM

## 2018-04-25 NOTE — Therapy (Signed)
[place], [place], [address] Phone: [phone]   Fax:  [phone]  Physical Therapy Evaluation  Patient Details  Name: [name] MRN: [id number] Date of Birth: Oct 06, 1950 Referring Provider: Dr Cyndia Bent    Encounter Date: 04/25/2018  PT End of Session - 04/25/18 1249    Visit Number  1    Number of Visits  1    Date for PT Re-Evaluation  04/26/18    Authorization Type  Medicare    PT Start Time  1100    PT Stop Time  1140    PT Time Calculation (min)  40 min    Activity Tolerance  Patient tolerated treatment well    Behavior During Therapy  [facility] for tasks assessed/performed       Past Medical History:  Diagnosis Date  . Abnormal Doppler ultrasound of carotid artery    a. 05/2013 - mild plaque, 1-39% BICA, abnormal appearrance of thyroid (TSH wnl in 06/2013).  Marland Kitchen CAD (coronary artery disease)    a. Nonobstructive by cath 2006 - 25% LAD, 25-30% after 1st diag, distal 25% PDA, minor irreg LCx. b. Normal nuc 2011.  . CKD (chronic kidney disease), stage III ([place])   . Cystocele   . Difficult intubation 09-07-2012   big neck trouble with intubation parotid surgery"takes little med to sedate"  . Dyslipidemia   . Dysrhythmia    hx a fib  . Esophageal stricture   . GERD (gastroesophageal reflux disease)   . Hiatal hernia    a. 07/2013: HH with small stricture holding up barium tablet (concides with patient's sx of food sticking).  . History of kidney stones   . Hyperlipidemia   . Hypertension    dr Percival Spanish  . IBS (irritable bowel syndrome)   . Memory difficulty 11/26/2015  . Morbid obesity ([place])   . Osteoarthritis    "all over" (02/14/2018)  . Osteopenia   . Osteoporosis   . Palpitations    a. PVC/PAC on EKG 08/2013.  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  Marland Kitchen Pericardial effusion    a. Mod by echo 2012 at time of PNA. b. Echo 07/2012: small pericardial effusion vs fat.   Marland Kitchen PONV (postoperative nausea and vomiting)    n/v, and room spinning after cataract and parotid surgery  . Sleep apnea     "have mask; don't use it" (02/14/2018)  . Superficial phlebitis   . TIA (transient ischemic attack) 10/2014; 06/2016   "some memory issues since; daughter says my speech is sometimes different" (02/14/2018)  . Type II diabetes mellitus ([place])     Past Surgical History:  Procedure Laterality Date  . BREATH TEK H PYLORI N/A 07/29/2013   Procedure: [place];  Surgeon: Shann Medal, MD;  Location: [facility];  Service: General;  Laterality: N/A;  . CARDIAC CATHETERIZATION  2006  . CARDIOVERSION N/A 02/16/2018   Procedure: CARDIOVERSION;  Surgeon: Pixie Casino, MD;  Location: [place];  Service: Cardiovascular;  Laterality: N/A;  . CARPAL TUNNEL RELEASE Left 2011  . CATARACT EXTRACTION W/ INTRAOCULAR LENS  IMPLANT, BILATERAL Bilateral 2003   left  . CHOLECYSTECTOMY OPEN  1982  . COLONOSCOPY N/A 07/14/2015   Procedure: COLONOSCOPY;  Surgeon: Ladene Artist, MD;  [facility];  Service: Endoscopy;  Laterality: N/A;  . ESOPHAGOGASTRODUODENOSCOPY N/A 12/27/2013   Procedure: ESOPHAGOGASTRODUODENOSCOPY (EGD);  Surgeon: Shann Medal, MD;  [facility];  Service: General;  Laterality: N/A;  . ESOPHAGOGASTRODUODENOSCOPY (EGD) WITH ESOPHAGEAL DILATION    . LAPAROSCOPIC GASTRIC SLEEVE RESECTION N/A 01/20/2014   Procedure: LAPAROSCOPIC GASTRIC SLEEVE RESECTION;  Surgeon: Shann Medal, MD;  [facility];  Service: General;  Laterality: N/A;  . PAROTIDECTOMY  09/07/2012   Procedure: PAROTIDECTOMY;  Surgeon: Melida Quitter, MD;  Location: [place];  Service: ENT;  Laterality: Left;  LEFT PAROTIDECTOMY  . RIGHT/LEFT HEART CATH AND CORONARY ANGIOGRAPHY N/A 03/29/2018   Procedure: RIGHT/LEFT HEART CATH AND CORONARY ANGIOGRAPHY;  Surgeon: Burnell Blanks, MD;  Location: [place] [facility];  Service: Cardiovascular;  Laterality: N/A;  . TEE  WITHOUT CARDIOVERSION N/A 02/16/2018   Procedure: TRANSESOPHAGEAL ECHOCARDIOGRAM (TEE);  Surgeon: Pixie Casino, MD;  Location[facility] END[facility]COPY;  Service: Cardiovascular;  Laterality: N/A;  . TOE AMPUTATION Left 07/2017   great toe; [facility]   . TONSILLECTOMY  1968  . TUBAL LIGATION  yrs ago  . UPPER GI ENDOSCOPY  01/20/2014   Procedure: UPPER GI ENDOSCOPY;  Surgeon: Shann Medal, MD;  [facility];  Service: General;;    There were no vitals filed for this visit.   Subjective Assessment - 04/25/18 1110    Subjective  Patient began feeling like she could not breathe when she was lying down at night. She also has some fatigue with walking and perfroming daily activity. She has intermittent back pain but none right now.     Limitations  Standing;Walking    Currently in Pain?  No/denies         [facility] PT Assessment - 04/25/18 0001      Assessment   Medical Diagnosis  Severe aortic stenosis     Referring Provider  Dr Cyndia Bent     Onset Date/Surgical Date  --   2 months    Hand Dominance  Right      Precautions   Precautions  None      Restrictions   Weight Bearing Restrictions  No      Balance Screen   Has the patient fallen in the past 6 months  No    Has the patient had a decrease in activity level because of a fear of falling?   No    Is the patient reluctant to leave their home because of a fear of falling?   No      Prior Function   Level of Independence  Independent    Vocation  Retired      Charity fundraiser Status  Within Functional Limits for tasks assessed    Attention  Focused    Focused Attention  Appears intact    Memory  Appears intact    Awareness  Appears intact    Problem Solving  Appears intact      Sensation   Light Touch  Appears Intact    Additional Comments  diabetic neuropathy in the feet       Coordination   Gross Motor Movements are Fluid and Coordinated  Yes    Fine Motor Movements are Fluid and Coordinated  Yes      ROM /  Strength   AROM / PROM / Strength  AROM;Strength      AROM   Overall AROM Comments  gross UE and LE WNL       Strength   Overall Strength Comments  Strength within normal limites gross     Strength Assessment Site  Hand    Right/Left hand  Right;Left    Right Hand Grip (lbs)  30    Left Hand Grip (lbs)  30       [facility] Pre-Surgical Assessment - 04/25/18 0001    5 Meter Walk Test- trial 1  8 sec    5 Meter Walk Test- trial 2  9 sec.     5 Meter Walk Test- trial 3  9 sec.    5 meter walk test average  8.67 sec    4 Stage Balance Test Position  3    comment  unable to perfrom single leg stance     Sit To Stand Test- trial 1  5 sec.    Comment  13    ADL/IADL Independent with:  Bathing;Dressing;Finances    ADL/IADL Needs Assistance with:  Meal prep;Yard work    ADL/IADL Therapist, sports Index  Midly frail    6 Minute Walk- Baseline  yes    BP (mmHg)  161/74    HR (bpm)  75    02 Sat (%RA)  97 %    Modified Borg Scale for Dyspnea  0- Nothing at all    Perceived Rate of Exertion (Borg)  6-    6 Minute Walk Post Test  yes    BP (mmHg)  187/84    HR (bpm)  86    02 Sat (%RA)  92 %    Modified Borg Scale for Dyspnea  2- Mild shortness of breath    Perceived Rate of Exertion (Borg)  12-    Aerobic Endurance Distance Walked  512    Endurance additional comments  1 seated rest break 1:10 rest break               Objective measurements completed on examination: See above findings.                           Plan - 04/25/18 1250    Clinical Impression Statement  See below     Clinical Presentation  Stable    Clinical Decision Making  Low    PT Frequency  One time visit    Consulted and Agree with Plan of Care  Patient      Clinical Impression Statement: Pt is a 67 yo female presenting to OP PT for evaluation prior to possible TAVR surgery due to severe aortic stenosis. Pt reports onset of shortness of breath when sleeping and with community ambulation  approximately 2  months ago. Symptoms are limiting patients ability to sleep and perform ADL's . Pt presents with Normal ROM and strength, fair balance and is low at high fall risk 4 stage balance test, decreased walking speed and decreased  aerobic endurance per 6 minute walk test. Pt ambulated  370  feet in 2:40  before requesting a seated rest beak lasting 1:30. At time of rest, patient's HR was 92 bpm and O2 was 93 on room air. Pt reported 2/10 shortness of breath on modified scale for dyspnea. Pt able to resume after rest and ambulate an additional 142 feet. Pt ambulated a total of 512 feet in 6 minute walk. B/Pincreased significantly with 6 minute walk test. Based on the Short Physical Performance Battery, patient has a frailty rating of 7/12 with </= 5/12 considered frail.   Patient will benefit from skilled therapeutic intervention in order to improve the following deficits and impairments:  Abnormal gait, Decreased endurance  Visit Diagnosis: Other abnormalities of gait and mobility  Problem List Patient Active Problem List   Diagnosis Date Noted  . Severe aortic stenosis   . Non-STEMI (non-ST elevated myocardial infarction) [facility]): troponin increased to ~with chest pain in setting of new onset A. fib.  7 02/15/2018  . Atrial flutter with rapid ventricular response ([place]) 02/15/2018  . Atrial fibrillation/flutter, new onset ([place]) 02/14/2018  . Diabetic retinopathy ([place]) 09/17/2017  . Moderate aortic stenosis by prior echocardiogram 02/13/2017  . Palpitations 02/13/2017  . Memory difficulty 11/26/2015  . Benign neoplasm of sigmoid colon 07/14/2015  . Low serum vitamin D 05/06/2015  . Vertigo 05/06/2015  . Aphasia 12/08/2014  . TIA (transient ischemic attack), history of 11/07/2014  . 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 CKD stage 3 due to type 2 diabetes mellitus ([place]) 06/03/2014  . Type II diabetes mellitus with renal manifestations ([place]) 06/03/2014  . Type II diabetes  mellitus with peripheral circulatory disorder ([place]) 06/03/2014  . Obesity (BMI 30.0-34.9) 04/24/2014  . Dizziness and giddiness 04/14/2014  . 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 Osteopenia 10/09/2013  . Abnormal ankle brachial index 09/11/2013  . Hypomagnesemia 08/01/2013  . DYSPNEA 08/18/2010  . MYOCARDIAL PERFUSION SCAN, WITH STRESS TEST, ABNORMAL 08/04/2010  . ELECTROCARDIOGRAM, ABNORMAL 07/28/2010  . Edema extremities 12/30/2008  . Hyperlipidemia associated with type 2 diabetes mellitus ([place]) 12/26/2008  . Benign essential HTN 12/26/2008  . Coronary artery disease, non-occlusive 12/26/2008  . PERICARDIAL EFFUSION 12/26/2008  . GERD 12/26/2008  . OSTEOARTHRITIS 12/26/2008    Carney Living PT DPT  04/25/2018, 1:03 PM  [facility] [address] [place], [place], [address] Phone: [phone]   Fax:  [phone]  Name: [name] MRN: [id number] Date of Birth: [birth year]

## 2018-04-30 ENCOUNTER — Encounter: Payer: Self-pay | Admitting: Surgery

## 2018-04-30 NOTE — Progress Notes (Signed)
Patient ID: [name], female   DOB: 04-Apr-1951, 67 y.o.   MRN: [id number]   [facility] SURGERY CONSULTATION REPORT  Referring Provider is Croitoru, Dani Gobble, MD PCP is Chipper Herb, MD  Chief Complaint  Patient presents with  . Aortic Stenosis    Surgical eval for TAVR, review all studies    HPI:  The patient is a 67 year old woman with a history of hypertension, hyperlipidemia, type 2 diabetes, morbid obesity, stage III chronic kidney disease, OSA but does not use her CPAP mask, and nonobstructive coronary disease by catheterization in 2006 who presented to [facility] on [facility]/14[facility]2018 with chest pain was found to have atrial fibrillation/flutter.  She underwent a TEE directed DCCV and converted to sinus rhythm.  Troponin was elevated at 7.1.  Catheterization was deferred since she was fully anticoagulated prior to her cardioversion.  Her TEE showed normal LV systolic function.  The aortic valve was calcified with reduced leaflet mobility.  The mean gradient was 32 mmHg with a peak gradient of 57 mmHg.  Dimensionless index was 0.32 and aortic valve area was 0.92 cm.  She has been maintained on Eliquis since her cardioversion on 02/16/2018.  She subsequently underwent cardiac catheterization on 03/29/2018 by Dr. Angelena Form which showed moderate disease in the ostium of the RCA as well as in the mid RCA.  There is moderate disease in the proximal left circumflex as well as the distal obtuse marginal branch.  There is mild proximal LAD stenosis.  The LAD tapered to a small caliber vessel distally which was diffusely diseased with 99% stenosis.  The mean gradient across aortic valve was measured at 29.6 mmHg with a peak to peak gradient of 34 mm.  Aortic valve area was 1.11 cm.  The patient is here today with her husband.  She reports a several month history of progressive exertional shortness of breath and fatigue.  This is improved  since she has been started on Lasix.  She also had some lower extremity swelling which improved on Lasix.  She has had orthopnea and has been sleeping sitting up.  She denies any chest pain or pressure.  She has had no dizziness or syncope.  Past Medical History:  Diagnosis Date  . Abnormal Doppler ultrasound of carotid artery    a. 05/2013 - mild plaque, 1-39% BICA, abnormal appearrance of thyroid (TSH wnl in 06/2013).  Marland Kitchen CAD (coronary artery disease)    a. Nonobstructive by cath 2006 - 25% LAD, 25-30% after 1st diag, distal 25% PDA, minor irreg LCx. b. Normal nuc 2011.  . CKD (chronic kidney disease), stage III ([place])   . Cystocele   . Difficult intubation 09-07-2012   big neck trouble with intubation parotid surgery"takes little med to sedate"  . Dyslipidemia   . Dysrhythmia    hx a fib  . Esophageal stricture   . GERD (gastroesophageal reflux disease)   . Hiatal hernia    a. 07/2013: HH with small stricture holding up barium tablet (concides with patient's sx of food sticking).  . History of kidney stones   . Hyperlipidemia   . Hypertension    dr Percival Spanish  . IBS (irritable bowel syndrome)   . Memory difficulty 11/26/2015  . Morbid obesity ([place])   . Osteoarthritis    "all over" (02/14/2018)  . Osteopenia   . Osteoporosis   . Palpitations    a. PVC/PAC on EKG 08/2013.  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  Marland Kitchen Pericardial effusion    a. Mod by echo 2012 at time of PNA. b. Echo 07/2012: small pericardial effusion vs fat.  Marland Kitchen PONV (postoperative nausea and vomiting)    n/v, and room spinning after cataract and parotid surgery  . Sleep apnea     "have mask; don't use it" (02/14/2018)  . Superficial phlebitis   . TIA (transient ischemic attack) 10/2014; 06/2016   "some memory issues since; daughter says my speech is sometimes different" (02/14/2018)  . Type II diabetes mellitus ([place])     Past Surgical History:  Procedure Laterality Date  . BREATH TEK H PYLORI  N/A 07/29/2013   Procedure: [place];  Surgeon: Shann Medal, MD;  Location: [facility];  Service: General;  Laterality: N/A;  . CARDIAC CATHETERIZATION  2006  . CARDIOVERSION N/A 02/16/2018   Procedure: CARDIOVERSION;  Surgeon: Pixie Casino, MD;  Location: [facility];  Service: Cardiovascular;  Laterality: N/A;  . CARPAL TUNNEL RELEASE Left 2011  . CATARACT EXTRACTION W/ INTRAOCULAR LENS  IMPLANT, BILATERAL Bilateral 2003   left  . CHOLECYSTECTOMY OPEN  1982  . COLONOSCOPY N/A 07/14/2015   Procedure: COLONOSCOPY;  Surgeon: Ladene Artist, MD;  Location: [facility];  Service: Endoscopy;  Laterality: N/A;  . ESOPHAGOGASTRODUODENOSCOPY N/A 12/27/2013   Procedure: ESOPHAGOGASTRODUODENOSCOPY (EGD);  Surgeon: Shann Medal, MD;  Location: [facility];  Service: General;  Laterality: N/A;  . ESOPHAGOGASTRODUODENOSCOPY (EGD) WITH ESOPHAGEAL DILATION    . LAPAROSCOPIC GASTRIC SLEEVE RESECTION N/A 01/20/2014   Procedure: LAPAROSCOPIC GASTRIC SLEEVE RESECTION;  Surgeon: Shann Medal, MD;  Location: [facility];  Service: General;  Laterality: N/A;  . PAROTIDECTOMY  09/07/2012   Procedure: PAROTIDECTOMY;  Surgeon: Melida Quitter, MD;  Location: [facility];  Service: ENT;  Laterality: Left;  LEFT PAROTIDECTOMY  . RIGHT/LEFT HEART CATH AND CORONARY ANGIOGRAPHY N/A 03/29/2018   Procedure: RIGHT/LEFT HEART CATH AND CORONARY ANGIOGRAPHY;  Surgeon: Burnell Blanks, MD;  Location: [facility];  Service: Cardiovascular;  Laterality: N/A;  . TEE WITHOUT CARDIOVERSION N/A 02/16/2018   Procedure: TRANSESOPHAGEAL ECHOCARDIOGRAM (TEE);  Surgeon: Pixie Casino, MD;  Location: [facility] ENDOSCOPY;  Service: Cardiovascular;  Laterality: N/A;  . TOE AMPUTATION Left 07/2017   great toe; [facility]   . TONSILLECTOMY  1968  . TUBAL LIGATION  yrs ago  . UPPER GI ENDOSCOPY  01/20/2014   Procedure: UPPER GI ENDOSCOPY;  Surgeon: Shann Medal, MD;  Location: [facility];  Service: General;;    Family History    Problem Relation Age of Onset  . Heart failure Mother   . Hypertension Mother   . Skin cancer Mother   . Colitis Mother   . Diabetes Mother   . Kidney Stones Mother   . Stroke Mother   . Heart disease Father   . Colon polyps Father   . Diabetes Father   . Heart failure Father   . Heart attack Sister   . Diabetes Other   . Stroke Maternal Grandfather   . Diabetes Maternal Grandfather   . Colon cancer Neg Hx     Social History   Socioeconomic History  . Marital status: Married    Spouse name: Not on file  . Number of children: 3  . Years of education: some college  . Highest education level: Some college, no degree  Occupational History  . Occupation: retired    Fish farm manager: [employer]  . Financial  resource strain: Not hard at all  . Food insecurity:    Worry: Never true    Inability: Never true  . Transportation needs:    Medical: No    Non-medical: No  Tobacco Use  . Smoking status: Never Smoker  . Smokeless tobacco: Never Used  Substance and Sexual Activity  . Alcohol use: Yes    Alcohol/week: 0.0 standard drinks    Comment: 02/14/2018 "mixed drink q few years"  . Drug use: Never  . Sexual activity: Yes    Birth control/protection: Post-menopausal  Lifestyle  . Physical activity:    Days per week: 0 days    Minutes per session: 0 min  . Stress: Only a little  Relationships  . Social connections:    Talks on phone: More than three times a week    Gets together: More than three times a week    Attends religious service: More than 4 times per year    Active member of club or organization: Yes    Attends meetings of clubs or organizations: More than 4 times per year    Relationship status: Married  . Intimate partner violence:    Fear of current or ex partner: No    Emotionally abused: No    Physically abused: No    Forced sexual activity: No  Other Topics Concern  . Not on file  Social History Narrative   Married   Patient is right  handed.   Patient rarely drinks caffeine.   Lives at home with husband in two story home. Husband washes laundry in the basement. They have 3 daughters and 7 grandchildren.     Current Outpatient Medications  Medication Sig Dispense Refill  . ACCU-CHEK AVIVA PLUS test strip USE TO CHECK BLOOD SUGAR UP TO TWICE DAILY OR AS INSTRUCTED 100 each 2  . albuterol (VENTOLIN HFA) 108 (90 Base) MCG/ACT inhaler Inhale 1 puff into the lungs 2 (two) times daily as needed for shortness of breath. 1 Inhaler 0  . amiodarone (PACERONE) 200 MG tablet Take 1 tablet (200 mg total) by mouth daily. 90 tablet 3  . apixaban (ELIQUIS) 5 MG TABS tablet Take 1 tablet (5 mg total) by mouth 2 (two) times daily. 180 tablet 3  . B-D INS SYR ULTRAFINE 1CC/31G 31G X 5/16" 1 ML MISC USE TO INJECT INSULIN TWICE A DAY AS INSTRUCTED (Patient taking differently: 1 Stick by Other route 3 (three) times daily before meals. ) 100 each 2  . Calcium Carbonate-Vitamin D (CALCIUM 600+D) 600-400 MG-UNIT per tablet Take 1 tablet by mouth 2 (two) times daily. (Patient taking differently: Take 1 tablet by mouth daily. )    . Cholecalciferol (VITAMIN D) 2000 units CAPS Take 2,000 Units by mouth daily.     . furosemide (LASIX) 20 MG tablet Take 1 tablet (20 mg total) by mouth 2 (two) times daily. 60 tablet 3  . Insulin Pen Needle 32G X 4 MM MISC Use to inject insulin with insulin pen 100 each 4  . LIVALO 4 MG TABS TAKE 1 TABLET BY MOUTH ONCE DAILY 90 tablet 1  . losartan (COZAAR) 25 MG tablet Take 1.5 tablets (37.5 mg total) by mouth daily. 90 tablet 3  . meclizine (ANTIVERT) 12.5 MG tablet Take 1 tablet (12.5 mg total) by mouth 3 (three) times daily as needed for dizziness. 30 tablet 1  . metFORMIN (GLUCOPHAGE) 1000 MG tablet TAKE 1 TABLET BY MOUTH 2 TIMES A DAY 180 tablet 0  . metoprolol  tartrate (LOPRESSOR) 25 MG tablet Take 1 tablet (25 mg total) by mouth 2 (two) times daily. 60 tablet 6  . Multiple Vitamin (MULTIVITAMIN WITH MINERALS) TABS  tablet Take 1 tablet by mouth daily.     Marland Kitchen NOVOLIN 70/30 RELION (70-30) 100 UNIT/ML injection Inject 15-65 Units into the skin See admin instructions. Taking 65 units with breakfast and 15 units at lunch and 65 units with dinner.    . ondansetron (ZOFRAN ODT) 4 MG disintegrating tablet Take 1 tablet (4 mg total) by mouth every 8 (eight) hours as needed for nausea. 10 tablet 0  . Propylene Glycol (SYSTANE COMPLETE) 0.6 % SOLN Apply 1 drop to eye daily as needed (dry eyes).     . sodium chloride (OCEAN) 0.65 % SOLN nasal spray Place 1 spray into both nostrils as needed for congestion. 15 mL 0  . vitamin E 400 UNIT capsule Take 400 Units by mouth daily.     No current facility-administered medications for this visit.     Allergies  Allergen Reactions  . Atorvastatin     Myalgias   . Crestor [Rosuvastatin Calcium] Other (See Comments)    Stiffness and back pain  . Ezetimibe-Simvastatin Other (See Comments)    Leg cramps  . Celebrex [Celecoxib] Rash  . Levemir [Insulin Detemir] Rash  . Penicillins Other (See Comments)    SYNCOPE (Tolerated Ancef) Has patient had a PCN reaction causing immediate rash, facial/tongue/throat swelling, SOB or lightheadedness with hypotension: Yes Has patient had a PCN reaction causing severe rash involving mucus membranes or skin necrosis: No Has patient had a PCN reaction that required hospitalization No Has patient had a PCN reaction occurring within the last 10 years: No If all of the above answers are "NO", then may proceed with Cephalosporin use.      Review of Systems:   General:  normal appetite, decreased energy, no weight gain, no weight loss, no fever  Cardiac:  no chest pain with exertion, no chest pain at rest, +SOB with  exertion, + resting SOB, no PND, + orthopnea, no palpitations, + arrhythmia, + atrial fibrillation, + LE edema, no dizzy spells, no syncope  Respiratory:  + shortness of breath, no home oxygen, no productive cough, no dry  cough, no bronchitis, no wheezing, no hemoptysis, no asthma, no pain with inspiration or cough, + sleep apnea, does not use her CPAP at night  GI:   no difficulty swallowing, no reflux, no frequent heartburn, no hiatal hernia, no abdominal pain, no constipation, no diarrhea, no hematochezia, no hematemesis, no melena  GU:   no dysuria,  no frequency, no urinary tract infection, no hematuria, no kidney stones, + kidney disease  Vascular:  no pain suggestive of claudication, no pain in feet, no leg cramps, no varicose veins, no DVT, no non-healing foot ulcer  Neuro:   no stroke, + TIA's, no seizures, no headaches, no temporary blindness one eye,  no slurred speech, + peripheral neuropathy, no chronic pain, no instability of gait, + memory/cognitive dysfunction  Musculoskeletal: no arthritis, no joint swelling, no myalgias, no difficulty walking, normal mobility   Skin:   no rash, no itching, no skin infections, no pressure sores or ulcerations  Psych:   no anxiety, no depression, no nervousness, no unusual recent stress  Eyes:   no blurry vision, no floaters, no recent vision changes, no wears glasses or contacts  ENT:   no hearing loss, no loose or painful teeth, no dentures, last saw dentist within last  year  Hematologic:  no easy bruising, no abnormal bleeding, no clotting disorder, no frequent epistaxis  Endocrine:  + diabetes, does check CBG's at home      Physical Exam:   BP (!) 168/83   Pulse 100   Resp 20   Ht 5\' 7"  (1.702 m)   Wt 218 lb (98.9 kg)   SpO2 94% Comment: RA  BMI 34.14 kg/m   General:  Obese,  well-appearing  HEENT:  Unremarkable, NCAT, PERLA, EOMI, oropharynx clear  Neck:   no JVD, no bruits, no adenopathy or thyromegaly  Chest:   clear to auscultation, symmetrical breath sounds, no wheezes, no rhonchi   CV:   RRR, grade III/VI crescendo/decrescendo murmur heard best at RSB,  no diastolic murmur  Abdomen:  soft, non-tender, no masses  Extremities:  warm,  well-perfused, pulses not palpable in feet, mild LE edema  Rectal/GU  Deferred  Neuro:   Grossly non-focal and symmetrical throughout  Skin:   Clean and dry, no rashes, no breakdown   Diagnostic Tests:      *[place]*                   *[facility]*                         [address], [place] [address]                            [phone]  ------------------------------------------------------------------- Transesophageal Echocardiography  Patient:    [name], [name] MR #:       [id number] Study Date: 02/16/2018 Gender:     F Age:        59 Height:     170.2 cm Weight:     99.5 kg BSA:        2.21 m^2 Pt. Status: Room:   ADMITTING    Sanda Klein, MD  ATTENDING    Sanda Klein, MD  PERFORMING   Lyman Bishop MD  SONOGRAPHER  Merrie Roof, RDCS  ORDERING     Kroeger, Lorelee Cover.  REFERRING    Kroeger, Daleen Snook M.  cc:  ------------------------------------------------------------------- LV EF: 65% -   70%  ------------------------------------------------------------------- Indications:      Atrial fibrillation - currently SR 427.31.  ------------------------------------------------------------------- Study Conclusions  - Left ventricle: Severe LVH. Small LV cavity. Systolic function   was vigorous. The estimated ejection fraction was in the range of   65% to 70%. Wall motion was normal; there were no regional wall   motion abnormalities. - Aortic valve: Calcified with restricted leaflet motion,   especially the non-coronary cusp which appears immobile. Severe   stenosis. Mean gradient (S): 32 mm Hg. Peak gradient (S): 57 mm   Hg. Valve area (VTI): 0.9 cm^2. Valve area (Vmax): 0.92 cm^2.   Valve area (Vmean): 0.7 cm^2. - Mitral valve: Mildly calcified annulus. Sclerotic leafelts,   possibly rheumatic. Mild regurgitation. - Left atrium: The atrium was dilated. No evidence of thrombus in   the atrial  cavity or appendage. - Right atrium: The atrium was dilated. - Atrial septum: No defect or patent foramen ovale was identified. - Tricuspid valve: There was mild regurgitation. - Pericardium, extracardiac: Small pericardial effusion.  Impressions:  - LVEF 65-70%, severe LVH, severe  aortic stenosis - mean gradient   of 32 mmHg - AVA 0.9 cm2, small pericardial effusion. Successful   DCCV to junctional rhythm.  ------------------------------------------------------------------- Study data:   Study status:  Routine.  Consent:  The risks, benefits, and alternatives to the procedure were explained to the patient and informed consent was obtained.  Procedure:  Initial setup. The patient was brought to the laboratory. Surface ECG leads were monitored. Sedation. Deep sedation was administered by anesthesiology staff. Transesophageal echocardiography. Topical anesthesia was obtained using viscous lidocaine. An adult multiplane transesophageal probe was inserted by the attending cardiologistwithout difficulty. Image quality was adequate.  Study completion:  The patient tolerated the procedure well. There were no complications.          Diagnostic transesophageal echocardiography.  2D and color Doppler.  Birthdate:  Patient birthdate: 08/27/1950.  Age:  Patient is 67 yr old.  Sex:  Gender: female.    BMI: 34.3 kg/m^2.  Blood pressure:     80/57  Patient status:  Inpatient.  Study date:  Study date: 02/16/2018. Study time: 01:34 PM.  Location:  Endoscopy.  -------------------------------------------------------------------  ------------------------------------------------------------------- Left ventricle:  Severe LVH. Small LV cavity. Systolic function was vigorous. The estimated ejection fraction was in the range of 65% to 70%. Wall motion was normal; there were no regional wall motion abnormalities.  ------------------------------------------------------------------- Aortic valve:   Calcified with restricted leaflet motion, especially the non-coronary cusp which appears immobile. Severe stenosis. Doppler:     VTI ratio of LVOT to aortic valve: 0.32. Valve area (VTI): 0.9 cm^2. Indexed valve area (VTI): 0.41 cm^2/m^2. Peak velocity ratio of LVOT to aortic valve: 0.32. Valve area (Vmax): 0.92 cm^2. Indexed valve area (Vmax): 0.42 cm^2/m^2. Mean velocity ratio of LVOT to aortic valve: 0.25. Valve area (Vmean): 0.7 cm^2. Indexed valve area (Vmean): 0.32 cm^2/m^2.    Mean gradient (S): 32 mm Hg. Peak gradient (S): 57 mm Hg.  ------------------------------------------------------------------- Mitral valve:   Mildly calcified annulus. Sclerotic leafelts, possibly rheumatic. Mild regurgitation.  ------------------------------------------------------------------- Left atrium:  The atrium was dilated.  No evidence of thrombus in the atrial cavity or appendage.  ------------------------------------------------------------------- Atrial septum:  No defect or patent foramen ovale was identified.   ------------------------------------------------------------------- Right ventricle:  Poorly visualized.  ------------------------------------------------------------------- Pulmonic valve:   Poorly visualized.  Doppler:  There was trivial regurgitation.  ------------------------------------------------------------------- Tricuspid valve:   Doppler:  There was mild regurgitation.  ------------------------------------------------------------------- Pulmonary artery:   The main pulmonary artery was normal-sized.  ------------------------------------------------------------------- Right atrium:  The atrium was dilated.  ------------------------------------------------------------------- Pericardium:  Small pericardial effusion.  ------------------------------------------------------------------- Measurements   Left ventricle                           Value  Stroke  volume, 2D                        62    ml  Stroke volume/bsa, 2D                    28    ml/m^2    LVOT                                     Value  LVOT ID, S  19    mm  LVOT area                                2.84  cm^2  LVOT peak velocity, S                    122   cm/s  LVOT mean velocity, S                    63.4  cm/s  LVOT VTI, S                              22    cm  LVOT peak gradient, S                    6     mm Hg    Aortic valve                             Value  Aortic valve peak velocity, S            377   cm/s  Aortic valve mean velocity, S            257   cm/s  Aortic valve VTI, S                      69.4  cm  Aortic mean gradient, S                  32    mm Hg  Aortic peak gradient, S                  57    mm Hg  VTI ratio, LVOT/AV                       0.32  Aortic valve area, VTI                   0.9   cm^2  Aortic valve area/bsa, VTI               0.41  cm^2/m^2  Velocity ratio, peak, LVOT/AV            0.32  Aortic valve area, peak velocity         0.92  cm^2  Aortic valve area/bsa, peak velocity     0.42  cm^2/m^2  Velocity ratio, mean, LVOT/AV            0.25  Aortic valve area, mean velocity         0.7   cm^2  Aortic valve area/bsa, mean velocity     0.32  cm^2/m^2  Legend: (L)  and  (H)  mark values outside specified reference range.  ------------------------------------------------------------------- Prepared and Electronically Authenticated by  Lyman Bishop MD 2019-07-05T15:51:24   Physicians   Panel Physicians Referring Physician Case Authorizing Physician  Burnell Blanks, MD (Primary)    Procedures   RIGHT/LEFT HEART CATH AND CORONARY ANGIOGRAPHY  Conclusion     Ost RCA to Prox RCA lesion is 50% stenosed.  Mid RCA lesion is 40% stenosed.  Mid RCA to Dist RCA lesion is 20% stenosed.  Ost RPDA to RPDA lesion is 40% stenosed.  Ost Cx lesion is  50% stenosed.  Prox Cx lesion is 65%  stenosed.  Mid Cx lesion is 70% stenosed.  Ost 3rd Mrg lesion is 20% stenosed.  Mid LM to Dist LM lesion is 25% stenosed.  Mid LAD to Dist LAD lesion is 99% stenosed.  Ost LAD to Prox LAD lesion is 20% stenosed.  Hemodynamic findings consistent with moderate pulmonary hypertension.   1. Moderate disease in the ostium of the RCA and in the mid RCA.  2. Moderate disease in the proximal segment of the Circumflex and in the distal OM branch.  3. Mild proximal LAD stenosis. The distal LAD tapers to a small caliber vessel and is diffusely diseased with 99% stenosis, too small for PCI.  4. Elevated filling pressures.  5. Moderately severe to Severe aortic stenosis (mean gradient 29.6 mmHg, peak to peak gradient 34 mmHg, AVA 1.11 cm2). Echo images demonstrate a heavily calcified and thickened valve with fusion of two of the leaflets. I think her aortic stenosis is in the severe range.   Recommendations: Medical management of CAD. Will continue with workup for TAVR vs AVR. I suspect that she will be a better candidate for TAVR given her co-morbidities. She may benefit from diuresis.     Indications   Severe aortic stenosis [I35.0 (ICD-10-CM)]  Procedural Details/Technique   Technical Details Indication: 67 yo female with history of diabetes mellitus, stage 3 chronic kidney disease, CAD, hyperlipidemia, GERD, HTN, morbid obesity, sleep apnea, prior TIA and severe aortic valve stenosis who is here today for cardiac cath . She is felt to have moderate to severe AS by echo. She had non-obstructive CAD by cath in 2006. She was admitted to [facility] 02/14/18 with c/o chest pain and was found to have atrial fibrillation/flutter. She underwent TEE guided DCCV and converted to sinus. Her troponin was found to be elevated, up to 7.1. Cath was deferred given the fact that she was fully anti-coagulated before and after her cardioversion. TEE showed normal LV systolic function. The aortic valve was calcified with  limited leaflet mobility. Mean gradient 32 mmHg, peak gradient 57 mmHg, AVA 0.92 cm2, DVI 0.32. She has been on Eliquis following TEE guided DCCV on 02/16/18.   Procedure: The risks, benefits, complications, treatment options, and expected outcomes were discussed with the patient. The patient and/or family concurred with the proposed plan, giving informed consent. The patient was brought to the cath lab after IV hydration was given. The patient was further sedated with Versed and Fentanyl. The right wrist was prepped and draped in a sterile fashion. 1% lidocaine was used for local anesthesia. Using the modified Seldinger access technique, a 5 French sheath was placed in the right radial artery. 3 mg Verapamil was given through the sheath. 5000 units IV heparin was given. I changed out the IV catheter in the right antecubital vein for a 5 French sheath. Right heart cath performed with a balloon tipped catheter. Standard diagnostic catheters were used to perform selective coronary angiography. I crossed the aortic valve with an AL-1 catheter and a straight wire. The sheath was removed from the right radial artery and a Terumo hemostasis band was applied at the arteriotomy site on the right wrist.     Estimated blood loss <50 mL.  During this procedure the patient was administered the following to achieve and maintain moderate conscious sedation: Versed 1 mg, Fentanyl 25 mcg, while the patient's heart rate, blood pressure, and oxygen saturation were continuously monitored. The period of conscious sedation was 38 minutes,  of which I was present face-to-face 100% of this time.  Complications   Complications documented before study signed (03/29/2018 12:08 PM EDT)    RIGHT/LEFT HEART CATH AND CORONARY ANGIOGRAPHY   None Documented by Burnell Blanks, MD 03/29/2018 12:02 PM EDT  Time Range: Intraprocedure      Coronary Findings   Diagnostic  Dominance: Right  Left Main  Mid LM to Dist LM lesion  25% stenosed  Mid LM to Dist LM lesion is 25% stenosed.  Left Anterior Descending  Ost LAD to Prox LAD lesion 20% stenosed  Ost LAD to Prox LAD lesion is 20% stenosed.  Mid LAD to Dist LAD lesion 99% stenosed  Mid LAD to Dist LAD lesion is 99% stenosed.  First Diagonal Branch  Vessel is small in size.  Second Diagonal Branch  Vessel is small in size.  Left Circumflex  Ost Cx lesion 50% stenosed  Ost Cx lesion is 50% stenosed.  Prox Cx lesion 65% stenosed  Prox Cx lesion is 65% stenosed.  Mid Cx lesion 70% stenosed  Mid Cx lesion is 70% stenosed.  Third Obtuse Marginal Branch  Ost 3rd Mrg lesion 20% stenosed  Ost 3rd Mrg lesion is 20% stenosed.  Right Coronary Artery  Vessel is large.  Ost RCA to Prox RCA lesion 50% stenosed  Ost RCA to Prox RCA lesion is 50% stenosed.  Mid RCA lesion 40% stenosed  Mid RCA lesion is 40% stenosed.  Mid RCA to Dist RCA lesion 20% stenosed  Mid RCA to Dist RCA lesion is 20% stenosed.  Right Posterior Descending Artery  Ost RPDA to RPDA lesion 40% stenosed  Ost RPDA to RPDA lesion is 40% stenosed.  Intervention   No interventions have been documented.  Right Heart   Right Heart Pressures Hemodynamic findings consistent with moderate pulmonary hypertension. Elevated LV EDP consistent with volume overload.  Coronary Diagrams   Diagnostic Diagram       Implants    No implant documentation for this case.  MERGE Images   Show images for CARDIAC CATHETERIZATION   Link to Procedure Log   Procedure Log    Hemo Data    Most Recent Value  RA A Wave 19 mmHg  RA V Wave 17 mmHg  RA Mean 14 mmHg  RV Systolic Pressure 63 mmHg  RV Diastolic Pressure 8 mmHg  RV EDP 18 mmHg  PA Systolic Pressure 56 mmHg  PA Diastolic Pressure 17 mmHg  PA Mean 38 mmHg  PW A Wave 27 mmHg  PW V Wave 38 mmHg  PW Mean 28 mmHg  AO Systolic Pressure 989 mmHg  AO Diastolic Pressure 72 mmHg  AO Mean 211 mmHg  LV Systolic Pressure 941 mmHg  LV Diastolic  Pressure 22 mmHg  LV EDP 26 mmHg  AOp Systolic Pressure 740 mmHg  AOp Diastolic Pressure 78 mmHg  AOp Mean Pressure 814 mmHg  LVp Systolic Pressure 481 mmHg  LVp Diastolic Pressure 26 mmHg  LVp EDP Pressure 37 mmHg    ADDENDUM REPORT: 04/19/2018 15:18  EXAM: OVER-READ INTERPRETATION  CT CHEST  The following report is an over-read performed by radiologist Dr. Rebekah Chesterfield [facility] on 04/19/2018. This over-read does not include interpretation of cardiac or coronary anatomy or pathology. The cardiac CTA interpretation by the cardiologist is attached.  COMPARISON:  None.  FINDINGS: Extracardiac findings will be described separately under dictation for contemporaneously obtained CTA chest, abdomen and pelvis.  IMPRESSION: Please see separate dictation for contemporaneously obtained CTA  chest, abdomen and pelvis dated 04/19/2018 for full description of relevant extracardiac findings.   Electronically Signed   By: Vinnie Langton M.D.   On: 04/19/2018 15:18   Addended by Etheleen Mayhew, MD on 04/19/2018 3:20 PM    Study Result   CLINICAL DATA:  67 year old female with severe aortic stenosis being evaluated for a TAVR procedure.  EXAM: Cardiac TAVR CT  TECHNIQUE: The patient was scanned on a [employer]. A 120 kV retrospective scan was triggered in the descending thoracic aorta at 111 HU's. Gantry rotation speed was 250 msecs and collimation was .6 mm. No beta blockade or nitro were given. The 3D data set was reconstructed in 5% intervals of the R-R cycle. Systolic and diastolic phases were analyzed on a dedicated work station using MPR, MIP and VRT modes. The patient received 80 cc of contrast.  FINDINGS: Aortic Valve: Trileaflet, severely calcified and thickened aortic valve with severely decreased leaflet opening. There are minimal calcifications extending into the LVOT.  Aorta: Normal size, mild diffuse  calcifications.  No dissection.  Sinotubular Junction: 29 x 28 mm  Ascending Thoracic Aorta: 31 x 30 mm  Aortic Arch: 24 x 23 mm  Descending Thoracic Aorta: 21 x 21 mm  Sinus of Valsalva Measurements:  Non-coronary: 29 mm  Right -coronary: 29 mm  Left -coronary: 30 mm  Coronary Artery Height above Annulus:  Left Main: 15 mm  Right Coronary: 17 mm  Virtual Basal Annulus Measurements:  Maximum/Minimum Diameter: 24.9 x 21.5 mm  Mean Diameter: 22.8 mm  Perimeter: 72.9 mm  Area: 407 mm2  Optimum Fluoroscopic Angle for Delivery: RAO 0 CRA 0  IMPRESSION: 1. Trileaflet, severely calcified and thickened aortic valve with severely decreased leaflet opening. There are minimal calcifications extending into the LVOT. Annular measurements are suitable for delivery of a 23 mm Edwards-SAPIEN 3 valve or 29 mm Medtronic CoreValve Evolut R.  2. Sufficient coronary to annulus distance.  3. Optimum Fluoroscopic Angle for Delivery: RAO 0 CRA 0  4. No thrombus in the left atrial appendage.  Electronically Signed: By: Ena Dawley On: 04/19/2018 13:04       CLINICAL DATA:  67 year old female with history of severe aortic stenosis. Preprocedural study prior to potential transcatheter aortic valve replacement (TAVR) procedure.  EXAM: CT ANGIOGRAPHY CHEST, ABDOMEN AND PELVIS  TECHNIQUE: Multidetector CT imaging through the chest, abdomen and pelvis was performed using the standard protocol during bolus administration of intravenous contrast. Multiplanar reconstructed images and MIPs were obtained and reviewed to evaluate the vascular anatomy.  CONTRAST:  152mL ISOVUE-370 IOPAMIDOL (ISOVUE-370) INJECTION 76%  COMPARISON:  Chest CT 05/07/2011. CT the abdomen and pelvis 01/03/2017.  FINDINGS: CTA CHEST FINDINGS  Cardiovascular: Heart size is mildly enlarged with left ventricular concentric hypertrophy. Small amount of pericardial fluid  and/or thickening. No pericardial calcification. There is aortic atherosclerosis, as well as atherosclerosis of the great vessels of the mediastinum and the coronary arteries, including calcified atherosclerotic plaque in the left main, left anterior descending, left circumflex and right coronary arteries. Severe thickening calcification of the aortic valve. Severe calcifications of the mitral annulus.  Mediastinum/Lymph Nodes: No pathologically enlarged mediastinal or hilar lymph nodes. Esophagus is unremarkable in appearance. No axillary lymphadenopathy.  Lungs/Pleura: Small bilateral pleural effusions. Mild ground-glass attenuation and interlobular septal thickening in the lungs bilaterally, most evident throughout the mid to lower lungs, concerning for a background of mild interstitial pulmonary edema. No confluent consolidative airspace disease. No suspicious appearing pulmonary nodules or masses  are noted.  Musculoskeletal/Soft Tissues: There are no aggressive appearing lytic or blastic lesions noted in the visualized portions of the skeleton.  CTA ABDOMEN AND PELVIS FINDINGS  Hepatobiliary: No suspicious cystic or solid hepatic lesions. No intra or extrahepatic biliary ductal dilatation. Status post cholecystectomy.  Pancreas: No pancreatic mass. No pancreatic ductal dilatation. No pancreatic or peripancreatic fluid or inflammatory changes.  Spleen: Small splenule adjacent to the spleen. Otherwise, unremarkable.  Adrenals/Urinary Tract: Bilateral kidneys and bilateral adrenal glands are normal in appearance. No hydroureteronephrosis. Urinary bladder is normal in appearance.  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 No pathologic dilatation of small bowel or colon. Normal appendix.  Vascular/Lymphatic: Vascular findings and measurements pertinent to potential TAVR procedure, as detailed below. Aortic  atherosclerosis, without evidence of aneurysm or dissection in the abdominal or pelvic vasculature. No lymphadenopathy noted in the abdomen or pelvis.  Reproductive: Uterus and ovaries are unremarkable in appearance.  Other: No significant volume of ascites.  No pneumoperitoneum.  Musculoskeletal: There are no aggressive appearing lytic or blastic lesions noted in the visualized portions of the skeleton.  VASCULAR MEASUREMENTS PERTINENT TO TAVR:  AORTA:  Minimal Aortic Diameter-12 x 13 mm  Severity of Aortic Calcification-severe  RIGHT PELVIS:  Right Common Iliac Artery -  Minimal Diameter-9.0 x 5.4 mm  Tortuosity-mild  Calcification-moderate  Right External Iliac Artery -  Minimal Diameter-6.4 x 6.5 mm  Tortuosity - mild  Calcification-none  Right Common Femoral Artery -  Minimal Diameter-8.2 x 8.2 mm  Tortuosity - mild  Calcification-none  LEFT PELVIS:  Left Common Iliac Artery -  Minimal Diameter-7.5 x 6.0 mm  Tortuosity - mild  Calcification-moderate  Left External Iliac Artery -  Minimal Diameter-7.9 x 6.5 mm  Tortuosity - mild  Calcification-none  Left Common Femoral Artery -  Minimal Diameter-7.5 x 7.2 mm  Tortuosity - mild  Calcification-mild  Review of the MIP images confirms the above findings.  IMPRESSION: 1. Vascular findings and measurements pertinent to potential TAVR procedure, as detailed above. 2. Severe thickening calcification of the aortic valve, compatible with reported clinical history of severe aortic stenosis. 3. Small amount of pericardial fluid and/or thickening. No pericardial calcification. 4. Cardiomegaly with left ventricular hypertrophy. 5. Aortic atherosclerosis, in addition to left main and 3 vessel coronary artery disease. 6. Additional incidental findings, as above.  Aortic Atherosclerosis (ICD10-I70.0).   Electronically Signed   By: Vinnie Langton  M.D.   On: 04/19/2018 16:03   Pulmonary Function Test  Order: [id number]  Status:  Edited Result - FINAL Visible to patient:  No (Not Released) Next appt:  07/23/2018 at 10:40 AM in Cardiology Minus Breeding, MD) Dx:  Severe aortic stenosis   Ref Range & Units 11d ago  FVC-Pre L 1.67   FVC-%Pred-Pre % 48   FVC-Post L 1.66   FVC-%Pred-Post % 47   FVC-%Change-Post % 0   FEV1-Pre L 1.45   FEV1-%Pred-Pre % 54   FEV1-Post L 1.49   FEV1-%Pred-Post % 56   FEV1-%Change-Post % 2   FEV6-Pre L 1.67   FEV6-%Pred-Pre % 50   FEV6-Post L 1.66   FEV6-%Pred-Post % 49   FEV6-%Change-Post % 0   Pre FEV1/FVC ratio % 87   FEV1FVC-%Pred-Pre % 112   Post FEV1/FVC ratio % 90   FEV1FVC-%Change-Post % 3   Pre FEV6/FVC Ratio % 100   FEV6FVC-%Pred-Pre % 104   Post FEV6/FVC ratio % 100   FEV6FVC-%Pred-Post % 104   FEF 25-75 Pre L/sec 1.92  FEF2575-%Pred-Pre % 87   FEF 25-75 Post L/sec 2.27   FEF2575-%Pred-Post % 103   FEF2575-%Change-Post % 18   RV L 1.55   RV % pred % 67   TLC L 3.33   TLC % pred % 60   DLCO unc ml/min/mmHg 14.12   DLCO unc % pred % 49   DL/VA ml/min/mmHg/L 5.19   DL/VA % pred % 100   Resulting Agency  [facility]      Specimen Collected: 04/19/18 07:31 Last Resulted: 04/19/18 08:27          STS Risk Score:  Procedure: Isolated AVR  Risk of Mortality: 2.614%  Renal Failure: 5.302%  Permanent Stroke: 2.024%  Prolonged Ventilation: 12.000%  DSW Infection: 0.176%  Reoperation: 2.440%  Morbidity or Mortality: 19.419%  Short Length of Stay: 22.158%  Long Length of Stay: 9.723%    Impression:  This 67 year old woman has stage D, severe, symptomatic aortic stenosis with New York Heart Association class III symptoms of exertional fatigue and shortness of breath as well as some occasional chest discomfort.  I have personally reviewed her echocardiogram, cardiac catheterization, and CTA studies.  Her echo shows a trileaflet aortic valve with calcification of the leaflets  and restricted motion.  The mean gradient is 32 mmHg and the valve area is 0.7 to 0.92 cm.  Left ventricular ejection fraction is 65 to 70%.  Cardiac catheterization shows moderate nonobstructive coronary disease in the left circumflex and right coronary artery territories with 90% diffusely diseased distal LAD that is not suitable for revascularization.  I agree that aortic valve replacement is the best treatment for this patient.  Her STS risk of mortality for open surgical aortic valve replacement is only 2.6% but I think her operative risk will be higher due to her morbid obesity and severe restrictive lung disease with moderate reduction in her diffusion capacity.  I think transcatheter aortic valve replacement would be the best option for her with less risk and a much quicker recovery.  I have personally reviewed her CT images.  Her gated cardiac CTA shows anatomy that is suitable for transcatheter aortic valve replacement.  She has a relatively small annulus that sizes to a 23 mm Sapien valve or a 29 mm Medtronic Evolute R.  Her abdominal and pelvic CTA shows pelvic vascular anatomy that is suitable for transfemoral insertion.  Our multidisciplinary heart valve team will review her studies and decide on the best valve for her.  The patient and her husband were counseled at length regarding treatment alternatives for management of severe symptomatic aortic stenosis. The risks and benefits of surgical intervention has been discussed in detail. Long-term prognosis with medical therapy was discussed. Alternative approaches such as conventional surgical aortic valve replacement, transcatheter aortic valve replacement, and palliative medical therapy were compared and contrasted at length. This discussion was placed in the context of the patient's own specific clinical presentation and past medical history. All of their questions have been addressed.   Following the decision to proceed with transcatheter aortic  valve replacement, a discussion was held regarding what types of management strategies would be attempted intraoperatively in the event of life-threatening complications, including whether or not the patient would be considered a candidate for the use of cardiopulmonary bypass and/or conversion to open sternotomy for attempted surgical intervention.   The patient has been advised of a variety of complications that might develop including but not limited to risks of death, stroke, paravalvular leak, aortic dissection or other major vascular  complications, aortic annulus rupture, device embolization, cardiac rupture or perforation, mitral regurgitation, acute myocardial infarction, arrhythmia, heart block or bradycardia requiring permanent pacemaker placement, congestive heart failure, respiratory failure, renal failure, pneumonia, infection, other late complications related to structural valve deterioration or migration, or other complications that might ultimately cause a temporary or permanent loss of functional independence or other long term morbidity. The patient provides full informed consent for the procedure as described and all questions were answered.      Plan:  She will be scheduled for transcatheter aortic valve replacement in the next few weeks.  Our multidisciplinary heart valve team will review her studies and decide on the type of valve use.   I spent 60 minutes performing this consultation and > 50% of this time was spent face to face counseling and coordinating the care of this patient's severe symptomatic aortic stenosis.   Gaye Pollack, MD 04/25/2018

## 2018-05-01 ENCOUNTER — Other Ambulatory Visit: Payer: Self-pay

## 2018-05-01 DIAGNOSIS — I350 Nonrheumatic aortic (valve) stenosis: Secondary | ICD-10-CM

## 2018-05-02 ENCOUNTER — Telehealth: Payer: Self-pay | Admitting: Family Medicine

## 2018-05-02 NOTE — Telephone Encounter (Signed)
Patient's heart valve surgery is on the 24th.

## 2018-05-04 ENCOUNTER — Encounter (HOSPITAL_COMMUNITY): Payer: Self-pay

## 2018-05-04 ENCOUNTER — Encounter (HOSPITAL_COMMUNITY)
Admission: RE | Admit: 2018-05-04 | Discharge: 2018-05-04 | Disposition: A | Discharge status: Home / Self Care | Payer: Medicare Other | Source: Ambulatory Visit | Attending: Surgery | Admitting: Surgery

## 2018-05-04 ENCOUNTER — Encounter (HOSPITAL_COMMUNITY)
Admission: RE | Admit: 2018-05-04 | Discharge: 2018-05-04 | Disposition: A | Discharge status: Home / Self Care | Payer: Medicare Other | Source: Ambulatory Visit | Attending: Cardiovascular Disease | Admitting: Cardiovascular Disease

## 2018-05-04 ENCOUNTER — Other Ambulatory Visit: Payer: Self-pay

## 2018-05-04 DIAGNOSIS — N183 Chronic kidney disease, stage 3 (moderate): Secondary | ICD-10-CM | POA: Insufficient documentation

## 2018-05-04 DIAGNOSIS — Z8249 Family history of ischemic heart disease and other diseases of the circulatory system: Secondary | ICD-10-CM | POA: Diagnosis not present

## 2018-05-04 DIAGNOSIS — I517 Cardiomegaly: Secondary | ICD-10-CM | POA: Insufficient documentation

## 2018-05-04 DIAGNOSIS — Z833 Family history of diabetes mellitus: Secondary | ICD-10-CM | POA: Diagnosis not present

## 2018-05-04 DIAGNOSIS — Z823 Family history of stroke: Secondary | ICD-10-CM | POA: Insufficient documentation

## 2018-05-04 DIAGNOSIS — J90 Pleural effusion, not elsewhere classified: Secondary | ICD-10-CM | POA: Insufficient documentation

## 2018-05-04 DIAGNOSIS — Z8673 Personal history of transient ischemic attack (TIA), and cerebral infarction without residual deficits: Secondary | ICD-10-CM | POA: Insufficient documentation

## 2018-05-04 DIAGNOSIS — I350 Nonrheumatic aortic (valve) stenosis: Secondary | ICD-10-CM | POA: Insufficient documentation

## 2018-05-04 DIAGNOSIS — Z954 Presence of other heart-valve replacement: Secondary | ICD-10-CM | POA: Diagnosis not present

## 2018-05-04 DIAGNOSIS — I129 Hypertensive chronic kidney disease with stage 1 through stage 4 chronic kidney disease, or unspecified chronic kidney disease: Secondary | ICD-10-CM | POA: Insufficient documentation

## 2018-05-04 DIAGNOSIS — K219 Gastro-esophageal reflux disease without esophagitis: Secondary | ICD-10-CM | POA: Insufficient documentation

## 2018-05-04 DIAGNOSIS — E1122 Type 2 diabetes mellitus with diabetic chronic kidney disease: Secondary | ICD-10-CM | POA: Insufficient documentation

## 2018-05-04 DIAGNOSIS — I700 Atherosclerosis of aorta: Secondary | ICD-10-CM | POA: Insufficient documentation

## 2018-05-04 DIAGNOSIS — E785 Hyperlipidemia, unspecified: Secondary | ICD-10-CM | POA: Insufficient documentation

## 2018-05-04 DIAGNOSIS — I2510 Atherosclerotic heart disease of native coronary artery without angina pectoris: Secondary | ICD-10-CM | POA: Insufficient documentation

## 2018-05-04 DIAGNOSIS — K589 Irritable bowel syndrome without diarrhea: Secondary | ICD-10-CM | POA: Insufficient documentation

## 2018-05-04 DIAGNOSIS — Z01818 Encounter for other preprocedural examination: Secondary | ICD-10-CM | POA: Diagnosis not present

## 2018-05-04 HISTORY — DX: Nonrheumatic aortic (valve) stenosis: I35.0

## 2018-05-04 LAB — BLOOD GAS, ARTERIAL
Acid-Base Excess: 4.7 mmol/L — ABNORMAL HIGH (ref 0.0–2.0)
Bicarbonate: 28.9 mmol/L — ABNORMAL HIGH (ref 20.0–28.0)
Drawn by: 535471
FIO2: 0.21
O2 Saturation: 98.7 %
Patient temperature: 98.6
pCO2 arterial: 44.5 mmHg (ref 32.0–48.0)
pH, Arterial: 7.429 (ref 7.350–7.450)
pO2, Arterial: 124 mmHg — ABNORMAL HIGH (ref 83.0–108.0)

## 2018-05-04 LAB — URINALYSIS, ROUTINE W REFLEX MICROSCOPIC
Bilirubin Urine: NEGATIVE
Glucose, UA: NEGATIVE mg/dL
Hgb urine dipstick: NEGATIVE
Ketones, ur: NEGATIVE mg/dL
Leukocytes, UA: NEGATIVE
Nitrite: NEGATIVE
Protein, ur: 30 mg/dL — AB
Specific Gravity, Urine: 1.018 (ref 1.005–1.030)
pH: 5 (ref 5.0–8.0)

## 2018-05-04 LAB — CBC
HCT: 42.9 % (ref 36.0–46.0)
Hemoglobin: 13.4 g/dL (ref 12.0–15.0)
MCH: 27.1 pg (ref 26.0–34.0)
MCHC: 31.2 g/dL (ref 30.0–36.0)
MCV: 86.7 fL (ref 78.0–100.0)
Platelets: 258 10*3/uL (ref 150–400)
RBC: 4.95 MIL/uL (ref 3.87–5.11)
RDW: 13.9 % (ref 11.5–15.5)
WBC: 8.6 10*3/uL (ref 4.0–10.5)

## 2018-05-04 LAB — SURGICAL PCR SCREEN
MRSA, PCR: NEGATIVE
Staphylococcus aureus: NEGATIVE

## 2018-05-04 LAB — COMPREHENSIVE METABOLIC PANEL
ALT: 16 U/L (ref 0–44)
AST: 16 U/L (ref 15–41)
Albumin: 3.5 g/dL (ref 3.5–5.0)
Alkaline Phosphatase: 55 U/L (ref 38–126)
Anion gap: 13 (ref 5–15)
BUN: 36 mg/dL — ABNORMAL HIGH (ref 8–23)
CO2: 26 mmol/L (ref 22–32)
Calcium: 9 mg/dL (ref 8.9–10.3)
Chloride: 102 mmol/L (ref 98–111)
Creatinine, Ser: 1.45 mg/dL — ABNORMAL HIGH (ref 0.44–1.00)
GFR calc Af Amer: 42 mL/min — ABNORMAL LOW (ref >60)
GFR calc non Af Amer: 36 mL/min — ABNORMAL LOW (ref >60)
Glucose, Bld: 232 mg/dL — ABNORMAL HIGH (ref 70–99)
Potassium: 4.2 mmol/L (ref 3.5–5.1)
Sodium: 141 mmol/L (ref 135–145)
Total Bilirubin: 1.1 mg/dL (ref 0.3–1.2)
Total Protein: 6.3 g/dL — ABNORMAL LOW (ref 6.5–8.1)

## 2018-05-04 LAB — HEMOGLOBIN A1C
Hgb A1c MFr Bld: 9.6 % — ABNORMAL HIGH (ref 4.8–5.6)
Mean Plasma Glucose: 228.82 mg/dL

## 2018-05-04 LAB — TYPE AND SCREEN
ABO/RH(D): A POS
Antibody Screen: NEGATIVE

## 2018-05-04 LAB — GLUCOSE, CAPILLARY: Glucose-Capillary: 182 mg/dL — ABNORMAL HIGH (ref 70–99)

## 2018-05-04 LAB — ABO/RH: ABO/RH(D): A POS

## 2018-05-04 LAB — APTT: aPTT: 23 seconds — ABNORMAL LOW (ref 24–36)

## 2018-05-04 LAB — BRAIN NATRIURETIC PEPTIDE: B Natriuretic Peptide: 141.8 pg/mL — ABNORMAL HIGH (ref 0.0–100.0)

## 2018-05-04 NOTE — Progress Notes (Signed)
Anesthesia Chart Review:  Case:  [id number] Date/Time:  05/08/18 0715   Procedures:      TRANSCATHETER AORTIC VALVE REPLACEMENT, TRANSFEMORAL (N/A Chest)     TRANSESOPHAGEAL ECHOCARDIOGRAM (TEE) (N/A )   Anesthesia type:  General   Pre-op diagnosis:  Severe Aortic Stenosis   Location:  [facility] ROOM 16 / [facility]   Surgeon:  Burnell Blanks, MD      DISCUSSION: Patient is a 67 year old female scheduled for the above procedure.   History includes severe AS, CAD (moderate RCA & LCX disease, 99% distal LAD no suitable for revascularization 03/2018), afib/flutter (s/p DCCV 02/16/18), palpitations, HTN, HLD, DM2, obesity (s/p laparoscopic gastric sleeve resection 01/20/14), CKD stage III. 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   Regarding DIFFICULT INTUBATION history: 01/20/14: laparoscopic gastric sleeve resection  09/07/12: left parotidectomy  Grade 1; Self; Endotracheal Tube; MAC, 3; Oral; 7.5 mm; Cuffed, Min.occ.pres.; 1; Video Laryngoscope; Direct Visualization, ETCO2 (Capnography), Bilateral Breath Sounds, Chest Rise; 22 cm Grade IV. IV induction. Ventilation:Mask ventilation throughout procedure, Mask ventilation without difficulty, Nasal airway inserted- appropriate to patient size, Oral airway inserted - appropriate to patient size and Two handed mask ventilation required.  Nasal tube: Left Tube size: 7.5 mm Number of attempts: 5 or more Airway Equipment and Method: Patient positioned with wedge pillow and Fiberoptic brochoscope    Diabetes historically not well controlled. A1c is 9.6, but down from 10.1 a few months ago. She will get a fasting CBG and PT/INR on the day of surgery. Per PAT RN notes, last Eliquis 05/02/18.    VS: BP (!) 174/88 Comment: pt states that she hasnt taken her bp meds this morning  Pulse 89   Temp 36.4 C   Resp 20   Ht 5' 7.5"  (1.715 m)   Wt 96.7 kg   SpO2 96%   BMI 32.90 kg/m    PROVIDERS: Chipper Herb, MD is PCP  Minus Breeding, MD is cardiologist Donna Christen, MD is endocrinologist Lenor Coffin, MD is neurologist    LABS: Preoperative labs noted. A1c 9.6 (down from 10.1 on 02/14/18), consistent with average glucose of 229. Cr 1.45, appears stable when compared to labs since 02/2018 (Cr ~ 1.20-1.68). Labs marked as reviewed by Dr. Angelena Form. (all labs ordered are listed, but only abnormal results are displayed)  Labs Reviewed  GLUCOSE, CAPILLARY - Abnormal; Notable for the following components:      Result Value   Glucose-Capillary 182 (*)    All other components within normal limits  APTT - Abnormal; Notable for the following components:   aPTT 23 (*)    All other components within normal limits  BLOOD GAS, ARTERIAL - Abnormal; Notable for the following components:   pO2, Arterial 124 (*)    Bicarbonate 28.9 (*)    Acid-Base Excess 4.7 (*)    All other components within normal limits  BRAIN NATRIURETIC PEPTIDE - Abnormal; Notable for the following components:   B Natriuretic Peptide 141.8 (*)    All other components within normal limits  COMPREHENSIVE METABOLIC PANEL - Abnormal; Notable for the following components:   Glucose, Bld 232 (*)    BUN 36 (*)    Creatinine, Ser 1.45 (*)    Total Protein 6.3 (*)    GFR calc non Af Amer 36 (*)    GFR calc Af Amer 42 (*)    All  other components within normal limits  HEMOGLOBIN A1C - Abnormal; Notable for the following components:   Hgb A1c MFr Bld 9.6 (*)    All other components within normal limits  URINALYSIS, ROUTINE W REFLEX MICROSCOPIC - Abnormal; Notable for the following components:   APPearance HAZY (*)    Protein, ur 30 (*)    Bacteria, UA RARE (*)    All other components within normal limits  SURGICAL PCR SCREEN  CBC  TYPE AND SCREEN  ABO/RH    PFTs 04/19/18: FVC 1.67 (48%), FEV1 1.45 (54%), DLCO unc 14.12  (49%).   IMAGES: CXR 05/04/18: IMPRESSION: - No edema or consolidation.  Minimal left pleural effusion. - There is cardiomegaly with pulmonary vascularity normal. There is aortic atherosclerosis. - Aortic Atherosclerosis (ICD10-I70.0).   EKG: 04/24/18 ([facility]): NSR, marked ST abnormality, possible lateral subendocardial injury. Baseline wanderer in high lateral leads (I, aVL) and II, aVR.   CV: CT coronary 04/19/18: IMPRESSION: 1. Trileaflet, severely calcified and thickened aortic valve with severely decreased leaflet opening. There are minimal calcifications extending into the LVOT. Annular measurements are suitable for delivery of a 23 mm Edwards-SAPIEN 3 valve or 29 mm Medtronic CoreValve Evolut R. 2. Sufficient coronary to annulus distance. 3. Optimum Fluoroscopic Angle for Delivery: RAO 0 CRA 0 4. No thrombus in the left atrial appendage.  Cardiac cath 03/29/18:  Ost RCA to Prox RCA lesion is 50% stenosed.  Mid RCA lesion is 40% stenosed.  Mid RCA to Dist RCA lesion is 20% stenosed.  Ost RPDA to RPDA lesion is 40% stenosed.  Ost Cx lesion is 50% stenosed.  Prox Cx lesion is 65% stenosed.  Mid Cx lesion is 70% stenosed.  Ost 3rd Mrg lesion is 20% stenosed.  Mid LM to Dist LM lesion is 25% stenosed.  Mid LAD to Dist LAD lesion is 99% stenosed.  Ost LAD to Prox LAD lesion is 20% stenosed.  Hemodynamic findings consistent with moderate pulmonary hypertension.  1. Moderate disease in the ostium of the RCA and in the mid RCA.  2. Moderate disease in the proximal segment of the Circumflex and in the distal OM branch.  3. Mild proximal LAD stenosis. The distal LAD tapers to a small caliber vessel and is diffusely diseased with 99% stenosis, too small for PCI.  4. Elevated filling pressures.  5. Moderately severe to Severe aortic stenosis (mean gradient 29.6 mmHg, peak to peak gradient 34 mmHg, AVA 1.11 cm2). Echo images demonstrate a heavily calcified and  thickened valve with fusion of two of the leaflets. I think her aortic stenosis is in the severe range.  Recommendations: Medical management of CAD. Will continue with workup for TAVR vs AVR. I suspect that she will be a better candidate for TAVR given her co-morbidities. She may benefit from diuresis.   TEE (with DCCV) 02/16/18: Study Conclusions - Left ventricle: Severe LVH. Small LV cavity. Systolic function   was vigorous. The estimated ejection fraction was in the range of   65% to 70%. Wall motion was normal; there were no regional wall   motion abnormalities. - Aortic valve: Calcified with restricted leaflet motion,   especially the non-coronary cusp which appears immobile. Severe   stenosis. Mean gradient (S): 32 mm Hg. Peak gradient (S): 57 mm   Hg. Valve area (VTI): 0.9 cm^2. Valve area (Vmax): 0.92 cm^2.   Valve area (Vmean): 0.7 cm^2. - Mitral valve: Mildly calcified annulus. Sclerotic leafelts,   possibly rheumatic. Mild regurgitation. - Left atrium: The atrium  was dilated. No evidence of thrombus in   the atrial cavity or appendage. - Right atrium: The atrium was dilated. - Atrial septum: No defect or patent foramen ovale was identified. - Tricuspid valve: There was mild regurgitation. - Pericardium, extracardiac: Small pericardial effusion. Impressions: - LVEF 65-70%, severe LVH, severe aortic stenosis - mean gradient   of 32 mmHg - AVA 0.9 cm2, small pericardial effusion. Successful   DCCV to junctional rhythm.  Carotid U/S 03/27/18: Final Interpretation: - Right Carotid: Velocities in the right ICA are consistent with a 1-39% stenosis.        The RICA velocities remain within normal range and stable        compared to the prior exam. - Left Carotid: Velocities in the left ICA are consistent with a 1-39% stenosis.       The LICA velocities remain within normal range and stable compared       to the prior exam. - Vertebrals: Bilateral  vertebral arteries demonstrate antegrade flow. - Subclavians: Normal flow hemodynamics were seen in bilateral subclavian       arteries.   Past Medical History:  Diagnosis Date  . Abnormal Doppler ultrasound of carotid artery    a. 05/2013 - mild plaque, 1-39% BICA, abnormal appearrance of thyroid (TSH wnl in 06/2013).  Marland Kitchen CAD (coronary artery disease)    a. Nonobstructive by cath 2006 - 25% LAD, 25-30% after 1st diag, distal 25% PDA, minor irreg LCx. b. Normal nuc 2011.  . CKD (chronic kidney disease), stage III ([place])   . Cystocele   . Difficult intubation 09-07-2012   big neck trouble with intubation parotid surgery"takes little med to sedate"  . Dyslipidemia   . Dysrhythmia    hx a fib  . Esophageal stricture   . GERD (gastroesophageal reflux disease)   . Heart murmur   . Hiatal hernia    a. 07/2013: HH with small stricture holding up barium tablet (concides with patient's sx of food sticking).  . History of kidney stones   . Hyperlipidemia   . Hypertension    dr Percival Spanish  . IBS (irritable bowel syndrome)   . Memory difficulty 11/26/2015  . Morbid obesity ([place])   . Osteoarthritis    "all over" (02/14/2018)  . Osteopenia   . Osteoporosis   . Palpitations    a. PVC/PAC on EKG 08/2013.  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  Marland Kitchen Pericardial effusion    a. Mod by echo 2012 at time of PNA. b. Echo 07/2012: small pericardial effusion vs fat.  . Pneumonia   . PONV (postoperative nausea and vomiting)    n/v, and room spinning after cataract and parotid surgery  . Severe aortic stenosis   . Sleep apnea     "have mask; don't use it" (02/14/2018)  . Superficial phlebitis   . TIA (transient ischemic attack) 10/2014; 06/2016   "some memory issues since; daughter says my speech is sometimes different" (02/14/2018)  . Type II diabetes mellitus ([place])   . Wears glasses     Past Surgical History:  Procedure Laterality Date  . BREATH TEK H PYLORI N/A 07/29/2013    Procedure: [place];  Surgeon: Shann Medal, MD;  Location: [facility];  Service: General;  Laterality: N/A;  . CARDIAC CATHETERIZATION  2006  . CARDIOVERSION N/A 02/16/2018   Procedure: CARDIOVERSION;  Surgeon: Pixie Casino, MD;  Location: [facility];  Service: Cardiovascular;  Laterality: N/A;  .  CARPAL TUNNEL RELEASE Left 2011  . CATARACT EXTRACTION W/ INTRAOCULAR LENS  IMPLANT, BILATERAL Bilateral 2003   left  . CHOLECYSTECTOMY OPEN  1982  . COLONOSCOPY N/A 07/14/2015   Procedure: COLONOSCOPY;  Surgeon: Ladene Artist, MD;  Location: [facility];  Service: Endoscopy;  Laterality: N/A;  . ESOPHAGOGASTRODUODENOSCOPY N/A 12/27/2013   Procedure: ESOPHAGOGASTRODUODENOSCOPY (EGD);  Surgeon: Shann Medal, MD;  Location: [facility];  Service: General;  Laterality: N/A;  . ESOPHAGOGASTRODUODENOSCOPY (EGD) WITH ESOPHAGEAL DILATION    . LAPAROSCOPIC GASTRIC SLEEVE RESECTION N/A 01/20/2014   Procedure: LAPAROSCOPIC GASTRIC SLEEVE RESECTION;  Surgeon: Shann Medal, MD;  Location: [facility];  Service: General;  Laterality: N/A;  . PAROTIDECTOMY  09/07/2012   Procedure: PAROTIDECTOMY;  Surgeon: Melida Quitter, MD;  Location: [facility];  Service: ENT;  Laterality: Left;  LEFT PAROTIDECTOMY  . RIGHT/LEFT HEART CATH AND CORONARY ANGIOGRAPHY N/A 03/29/2018   Procedure: RIGHT/LEFT HEART CATH AND CORONARY ANGIOGRAPHY;  Surgeon: Burnell Blanks, MD;  Location: [facility];  Service: Cardiovascular;  Laterality: N/A;  . TEE WITHOUT CARDIOVERSION N/A 02/16/2018   Procedure: TRANSESOPHAGEAL ECHOCARDIOGRAM (TEE);  Surgeon: Pixie Casino, MD;  [facility];  Service: Cardiovascular;  Laterality: N/A;  . TOE AMPUTATION Left 07/2017   great toe; [facility]   . TONSILLECTOMY  1968  . TUBAL LIGATION  yrs ago  . UPPER GI ENDOSCOPY  01/20/2014   Procedure: UPPER GI ENDOSCOPY;  Surgeon: Shann Medal, MD;  Location[facility];  Service: General;;    MEDICATIONS: . ACCU-CHEK AVIVA  PLUS test strip  . albuterol (VENTOLIN HFA) 108 (90 Base) MCG/ACT inhaler  . amiodarone (PACERONE) 200 MG tablet  . apixaban (ELIQUIS) 5 MG TABS tablet  . B-D INS SYR ULTRAFINE 1CC/31G 31G X 5/16" 1 ML MISC  . Calcium Carbonate-Vitamin D (CALCIUM 600+D) 600-400 MG-UNIT per tablet  . furosemide (LASIX) 20 MG tablet  . Insulin Pen Needle 32G X 4 MM MISC  . LIVALO 4 MG TABS  . losartan (COZAAR) 25 MG tablet  . meclizine (ANTIVERT) 12.5 MG tablet  . metFORMIN (GLUCOPHAGE) 1000 MG tablet  . metoprolol tartrate (LOPRESSOR) 25 MG tablet  . NOVOLIN 70/30 RELION (70-30) 100 UNIT/ML injection  . ondansetron (ZOFRAN ODT) 4 MG disintegrating tablet  . Prenatal Vit-Fe Fumarate-FA (PRENATAL MULTIVITAMIN) TABS tablet  . Propylene Glycol (SYSTANE COMPLETE) 0.6 % SOLN  . sodium chloride (OCEAN) 0.65 % SOLN nasal spray  . Triamcinolone Acetonide (TRIAMCINOLONE 0.1 % CREAM : EUCERIN) CREA  . vitamin E 400 UNIT capsule   No current facility-administered medications for this encounter.     [facility] Short Stay Center/Anesthesiology Phone [phone] 05/04/2018 4:18 PM

## 2018-05-04 NOTE — Progress Notes (Signed)
Pt denies any acute cardiopulmonary issues. Pt under the care of Dr. Percival Spanish. Pt BP was elevated at PAT visit today. Pt stated that she had not taken her BP medications this morning. Pt stated that her last dose of Eliquis was 9/18 and pt stated that she was instructed to stop taking Metformin on 05/06/18. See anesthesia note.

## 2018-05-04 NOTE — Pre-Procedure Instructions (Signed)
[name]  05/04/2018      [id number] - [place], [place] [address] Phone: [phone] Fax: [phone]  [id number] - [place], [place] [address] Phone: [phone] Fax: [phone], [place], [place] [address] [place] [address] Phone: [phone] Fax: [place] [address], [place] [address] Phone: [phone] Fax: [phone]   Your procedure is scheduled on Tuesday, May 08, 2018  Report to [facility] at 5:30 A.M.  Call this number if you have problems the morning of surgery:  [phone]   Remember:  Do not eat or drink after midnight Monday, May 07, 2018   Take these medicines the morning of surgery with A SIP OF WATER: none (per MD) If needed: albuterol (VENTOLIN HFA)  Inhaler for shortness of breath ( Bring inhaler in with you on day of surgery) Stop taking vitamins, fish oil and herbal medications. Do not take any NSAIDs ie: Ibuprofen, Advil, Naproxen (Aleve), Motrin, BC and Goody Powder; stop now.   How to Manage Your Diabetes Before and After Surgery  Why is it important to control my blood sugar before and after surgery? . Improving blood sugar levels before and after surgery helps healing and can limit problems. . A way of improving blood sugar control is eating a healthy diet by: o  Eating less sugar and carbohydrates o  Increasing activity/exercise o  Talking with your doctor about reaching your blood sugar goals . High blood sugars (greater than 180 mg/dL) can raise your risk of infections and slow your recovery, so you will need to focus on controlling your diabetes during the weeks before surgery. . Make sure that the doctor who takes care of your diabetes knows about your planned surgery  including the date and location.  How do I manage my blood sugar before surgery? . Check your blood sugar at least 4 times a day, starting 2 days before surgery, to make sure that the level is not too high or low. o Check your blood sugar the morning of your surgery when you wake up and every 2 hours until you get to the Short Stay unit. . If your blood sugar is less than 70 mg/dL, you will need to treat for low blood sugar: o Treat a low blood sugar (less than 70 mg/dL) with  cup of clear juice (cranberry or apple), 4 glucose tablets, OR glucose gel. Recheck blood sugar in 15 minutes after treatment (to make sure it is greater than 70 mg/dL). If your blood sugar is not greater than 70 mg/dL on recheck, call [phone] o  for further instructions. . Report your blood sugar to the short stay nurse when you get to Short Stay.  . If you are admitted to the hospital after surgery: o Your blood sugar will be checked by the staff and you will probably be given insulin after surgery (instead of oral diabetes medicines) to make sure you have good blood sugar levels. o The goal for blood sugar control after surgery is 80-180 mg/dL.  Reviewed and Endorsed by [facility] Patient Education Committee, August 2015   Do not wear jewelry, make-up or nail polish.  Do not wear lotions, powders, or perfumes, or deodorant.  Do not shave 48  hours prior to surgery.   Do not bring valuables to the hospital.  [facility] is not responsible for any belongings or valuables.  Contacts, dentures or bridgework may not be worn into surgery.  Leave your suitcase in the car.  After surgery it may be brought to your room.  Patients discharged the day of surgery will not be allowed to drive home.  Special instructions: Shower the night before and the morning of surgery with CHG. Please read over the following fact sheets that you were given. Pain Booklet, Coughing and Deep Breathing, MRSA Information and Surgical Site  Infection Prevention

## 2018-05-07 MED ORDER — DOPAMINE-DEXTROSE 3.2-5 MG/ML-% IV SOLN
0.0000 ug/kg/min | INTRAVENOUS | Status: DC
  Filled 2018-05-07: qty 250

## 2018-05-07 MED ORDER — PHENYLEPHRINE HCL-NACL 20-0.9 MG/250ML-% IV SOLN
30.0000 ug/min | INTRAVENOUS | Status: DC
  Filled 2018-05-07: qty 250

## 2018-05-07 MED ORDER — POTASSIUM CHLORIDE 2 MEQ/ML IV SOLN
80.0000 meq | INTRAVENOUS | Status: DC
  Filled 2018-05-07: qty 40

## 2018-05-07 MED ORDER — MAGNESIUM SULFATE 50 % IJ SOLN
40.0000 meq | INTRAMUSCULAR | Status: DC
  Filled 2018-05-07: qty 9.85

## 2018-05-07 MED ORDER — VANCOMYCIN HCL 10 G IV SOLR
1500.0000 mg | INTRAVENOUS | Status: AC
  Administered 2018-05-08: 1500 mg via INTRAVENOUS
  Filled 2018-05-07: qty 1500

## 2018-05-07 MED ORDER — SODIUM CHLORIDE 0.9 % IV SOLN
1.5000 g | INTRAVENOUS | Status: AC
  Administered 2018-05-08: 1.5 g via INTRAVENOUS
  Filled 2018-05-07: qty 1.5

## 2018-05-07 MED ORDER — EPINEPHRINE PF 1 MG/ML IJ SOLN
0.0000 ug/min | INTRAVENOUS | Status: DC
  Filled 2018-05-07: qty 4

## 2018-05-07 MED ORDER — NITROGLYCERIN IN D5W 200-5 MCG/ML-% IV SOLN
2.0000 ug/min | INTRAVENOUS | Status: DC
  Filled 2018-05-07: qty 250

## 2018-05-07 MED ORDER — DEXMEDETOMIDINE HCL IN NACL 400 MCG/100ML IV SOLN
0.1000 ug/kg/h | INTRAVENOUS | Status: AC
  Administered 2018-05-08: 1 ug/kg/h via INTRAVENOUS
  Filled 2018-05-07: qty 100

## 2018-05-07 MED ORDER — NOREPINEPHRINE 4 MG/250ML-% IV SOLN
0.0000 ug/min | INTRAVENOUS | Status: DC
  Filled 2018-05-07: qty 250

## 2018-05-07 MED ORDER — SODIUM CHLORIDE 0.9 % IV SOLN
INTRAVENOUS | Status: DC
  Filled 2018-05-07: qty 1

## 2018-05-07 MED ORDER — SODIUM CHLORIDE 0.9 % IV SOLN
INTRAVENOUS | Status: DC
  Filled 2018-05-07: qty 30

## 2018-05-07 NOTE — Anesthesia Preprocedure Evaluation (Addendum)
Anesthesia Evaluation  Patient identified by MRN, date of birth, ID band Patient awake    Reviewed: Allergy & Precautions, NPO status , Patient's Chart, lab work & pertinent test results  History of Anesthesia Complications (+) PONV and DIFFICULT AIRWAY  Airway Mallampati: IV  TM Distance: >3 FB Neck ROM: Limited  Mouth opening: Limited Mouth Opening  Dental  (+) Dental Advisory Given   Pulmonary sleep apnea ,    breath sounds clear to auscultation       Cardiovascular hypertension, Pt. on medications and Pt. on home beta blockers + CAD and + Peripheral Vascular Disease  + dysrhythmias Atrial Fibrillation + Valvular Problems/Murmurs AS  Rhythm:Regular Rate:Normal     Neuro/Psych TIA   GI/Hepatic Neg liver ROS, GERD  ,  Endo/Other  diabetes, Poorly Controlled, Type 2, Insulin Dependent  Renal/GU CRFRenal disease     Musculoskeletal  (+) Arthritis ,   Abdominal   Peds  Hematology negative hematology ROS (+)   Anesthesia Other Findings   Reproductive/Obstetrics                            Lab Results  Component Value Date   WBC 8.6 05/04/2018   HGB 13.4 05/04/2018   HCT 42.9 05/04/2018   MCV 86.7 05/04/2018   PLT 258 05/04/2018   Lab Results  Component Value Date   CREATININE 1.45 (H) 05/04/2018   BUN 36 (H) 05/04/2018   NA 141 05/04/2018   K 4.2 05/04/2018   CL 102 05/04/2018   CO2 26 05/04/2018   - LVEF 65-70%, severe LVH, severe aortic stenosis - mean gradientof 32 mmHg - AVA 0.9 cm2, small pericardial effusion. SuccessfulDCCV to junctional rhythm. Anesthesia Physical Anesthesia Plan  ASA: IV  Anesthesia Plan: MAC   Post-op Pain Management:    Induction: Intravenous  PONV Risk Score and Plan: 3 and Propofol infusion, Ondansetron, Dexamethasone and Treatment may vary due to age or medical condition  Airway Management Planned: Natural Airway and Simple Face  Mask  Additional Equipment: Arterial line, CVP and Ultrasound Guidance Line Placement  Intra-op Plan:   Post-operative Plan:   Informed Consent: I have reviewed the patients History and Physical, chart, labs and discussed the procedure including the risks, benefits and alternatives for the proposed anesthesia with the patient or authorized representative who has indicated his/her understanding and acceptance.   Dental advisory given  Plan Discussed with: CRNA  Anesthesia Plan Comments:        Anesthesia Quick Evaluation

## 2018-05-07 NOTE — H&P (Signed)
[address]       [place] [address]             [phone]      Cardiothoracic Surgery Admission History and Physical   Referring Provider is Croitoru, Dani Gobble, MD  PCP is Chipper Herb, MD      Chief Complaint  Patient presents with  . Aortic Stenosis       HPI:   The patient is a 67 year old woman with a history of hypertension, hyperlipidemia, type 2 diabetes, morbid obesity, stage III chronic kidney disease, OSA but does not use her CPAP mask, and nonobstructive coronary disease by catheterization in 2006 who presented to [facility] on 02/14/2018 with chest pain was found to have atrial fibrillation/flutter. She underwent a TEE directed DCCV and converted to sinus rhythm. Troponin was elevated at 7.1. Catheterization was deferred since she was fully anticoagulated prior to her cardioversion. Her TEE showed normal LV systolic function. The aortic valve was calcified with reduced leaflet mobility. The mean gradient was 32 mmHg with a peak gradient of 57 mmHg. Dimensionless index was 0.32 and aortic valve area was 0.92 cm. She has been maintained on Eliquis since her cardioversion on 02/16/2018. She subsequently underwent cardiac catheterization on 03/29/2018 by Dr. Angelena Form which showed moderate disease in the ostium of the RCA as well as in the mid RCA. There is moderate disease in the proximal left circumflex as well as the distal obtuse marginal branch. There is mild proximal LAD stenosis. The LAD tapered to a small caliber vessel distally which was diffusely diseased with 99% stenosis. The mean gradient across aortic valve was measured at 29.6 mmHg with a peak to peak gradient of 34 mm. Aortic valve area was 1.11 cm.  She reports a several month history of progressive exertional shortness of breath and fatigue. This is improved since she has been started on Lasix. She also had some lower extremity swelling which improved on Lasix. She has had orthopnea and has been  sleeping sitting up. She denies any chest pain or pressure. She has had no dizziness or syncope.      Past Medical History:  Diagnosis Date  . Abnormal Doppler ultrasound of carotid artery    a. 05/2013 - mild plaque, 1-39% BICA, abnormal appearrance of thyroid (TSH wnl in 06/2013).  Marland Kitchen CAD (coronary artery disease)    a. Nonobstructive by cath 2006 - 25% LAD, 25-30% after 1st diag, distal 25% PDA, minor irreg LCx. b. Normal nuc 2011.  . CKD (chronic kidney disease), stage III ([place])   . Cystocele   . Difficult intubation 09-07-2012   big neck trouble with intubation parotid surgery"takes little med to sedate"  . Dyslipidemia   . Dysrhythmia    hx a fib  . Esophageal stricture   . GERD (gastroesophageal reflux disease)   . Hiatal hernia    a. 07/2013: HH with small stricture holding up barium tablet (concides with patient's sx of food sticking).  . History of kidney stones   . Hyperlipidemia   . Hypertension    dr Percival Spanish  . IBS (irritable bowel syndrome)   . Memory difficulty 11/26/2015  . Morbid obesity ([place])   . Osteoarthritis    "all over" (02/14/2018)  . Osteopenia   . Osteoporosis   . Palpitations    a. PVC/PAC on EKG 08/2013.  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  Marland Kitchen Pericardial effusion  a. Mod by echo 2012 at time of PNA. b. Echo 07/2012: small pericardial effusion vs fat.  Marland Kitchen PONV (postoperative nausea and vomiting)    n/v, and room spinning after cataract and parotid surgery  . Sleep apnea    "have mask; don't use it" (02/14/2018)  . Superficial phlebitis   . TIA (transient ischemic attack) 10/2014; 06/2016   "some memory issues since; daughter says my speech is sometimes different" (02/14/2018)  . Type II diabetes mellitus ([place])         Past Surgical History:  Procedure Laterality Date  . BREATH TEK H PYLORI N/A 07/29/2013   Procedure: [place]; Surgeon: Shann Medal, MD; Location: [facility]; Service: General; Laterality: N/A;    . CARDIAC CATHETERIZATION  2006  . CARDIOVERSION N/A 02/16/2018   Procedure: CARDIOVERSION; Surgeon: Pixie Casino, MD; Location: [facility]; Service: Cardiovascular; Laterality: N/A;  . CARPAL TUNNEL RELEASE Left 2011  . CATARACT EXTRACTION W/ INTRAOCULAR LENS IMPLANT, BILATERAL Bilateral 2003   left  . CHOLECYSTECTOMY OPEN  1982  . COLONOSCOPY N/A 07/14/2015   Procedure: COLONOSCOPY; Surgeon: Ladene Artist, MD; Location: [facility]; Service: Endoscopy; Laterality: N/A;  . ESOPHAGOGASTRODUODENOSCOPY N/A 12/27/2013   Procedure: ESOPHAGOGASTRODUODENOSCOPY (EGD); Surgeon: Shann Medal, MD; Location: [facility]; Service: General; Laterality: N/A;  . ESOPHAGOGASTRODUODENOSCOPY (EGD) WITH ESOPHAGEAL DILATION    . LAPAROSCOPIC GASTRIC SLEEVE RESECTION N/A 01/20/2014   Procedure: LAPAROSCOPIC GASTRIC SLEEVE RESECTION; Surgeon: Shann Medal, MD; Location: [facility]; Service: General; Laterality: N/A;  . PAROTIDECTOMY  09/07/2012   Procedure: PAROTIDECTOMY; Surgeon: Melida Quitter, MD; Location: [facility]; Service: ENT; Laterality: Left; LEFT PAROTIDECTOMY  . RIGHT/LEFT HEART CATH AND CORONARY ANGIOGRAPHY N/A 03/29/2018   Procedure: RIGHT/LEFT HEART CATH AND CORONARY ANGIOGRAPHY; Surgeon: Burnell Blanks, MD; Location: [facility]; Service: Cardiovascular; Laterality: N/A;  . TEE WITHOUT CARDIOVERSION N/A 02/16/2018   Procedure: TRANSESOPHAGEAL ECHOCARDIOGRAM (TEE); Surgeon: Pixie Casino, MD; Location: [facility]; Service: Cardiovascular; Laterality: N/A;  . TOE AMPUTATION Left 07/2017   great toe; [facility]   . TONSILLECTOMY  1968  . TUBAL LIGATION  yrs ago  . UPPER GI ENDOSCOPY  01/20/2014   Procedure: UPPER GI ENDOSCOPY; Surgeon: Shann Medal, MD; Location: [facility]; Service: General;;        Family History  Problem Relation Age of Onset  . Heart failure Mother   . Hypertension Mother   . Skin cancer Mother   . Colitis Mother   . Diabetes Mother   . Kidney Stones Mother    . Stroke Mother   . Heart disease Father   . Colon polyps Father   . Diabetes Father   . Heart failure Father   . Heart attack Sister   . Diabetes Other   . Stroke Maternal Grandfather   . Diabetes Maternal Grandfather   . Colon cancer Neg Hx    Social History        Socioeconomic History  . Marital status: Married    Spouse name: Not on file  . Number of children: 3  . Years of education: some college  . Highest education level: Some college, no degree  Occupational History  . Occupation: retired    Fish farm manager: [employer]  . Financial resource strain: Not hard at all  . Food insecurity:    Worry: Never true    Inability: Never true  . Transportation needs:    Medical: No    Non-medical: No  Tobacco Use  .  Smoking status: Never Smoker  . Smokeless tobacco: Never Used  Substance and Sexual Activity  . Alcohol use: Yes    Alcohol/week: 0.0 standard drinks    Comment: 02/14/2018 "mixed drink q few years"  . Drug use: Never  . Sexual activity: Yes    Birth control/protection: Post-menopausal  Lifestyle  . Physical activity:    Days per week: 0 days    Minutes per session: 0 min  . Stress: Only a little  Relationships  . Social connections:    Talks on phone: More than three times a week    Gets together: More than three times a week    Attends religious service: More than 4 times per year    Active member of club or organization: Yes    Attends meetings of clubs or organizations: More than 4 times per year    Relationship status: Married  . Intimate partner violence:    Fear of current or ex partner: No    Emotionally abused: No    Physically abused: No    Forced sexual activity: No  Other Topics Concern  . Not on file  Social History Narrative   Married   Patient is right handed.   Patient rarely drinks caffeine.   Lives at home with husband in two story home. Husband washes laundry in the basement. They have 3 daughters and 7  grandchildren.          Current Outpatient Medications  Medication Sig Dispense Refill  . ACCU-CHEK AVIVA PLUS test strip USE TO CHECK BLOOD SUGAR UP TO TWICE DAILY OR AS INSTRUCTED 100 each 2  . albuterol (VENTOLIN HFA) 108 (90 Base) MCG/ACT inhaler Inhale 1 puff into the lungs 2 (two) times daily as needed for shortness of breath. 1 Inhaler 0  . amiodarone (PACERONE) 200 MG tablet Take 1 tablet (200 mg total) by mouth daily. 90 tablet 3  . apixaban (ELIQUIS) 5 MG TABS tablet Take 1 tablet (5 mg total) by mouth 2 (two) times daily. 180 tablet 3  . B-D INS SYR ULTRAFINE 1CC/31G 31G X 5/16" 1 ML MISC USE TO INJECT INSULIN TWICE A DAY AS INSTRUCTED (Patient taking differently: 1 Stick by Other route 3 (three) times daily before meals. ) 100 each 2  . Calcium Carbonate-Vitamin D (CALCIUM 600+D) 600-400 MG-UNIT per tablet Take 1 tablet by mouth 2 (two) times daily. (Patient taking differently: Take 1 tablet by mouth daily. )    . Cholecalciferol (VITAMIN D) 2000 units CAPS Take 2,000 Units by mouth daily.     . furosemide (LASIX) 20 MG tablet Take 1 tablet (20 mg total) by mouth 2 (two) times daily. 60 tablet 3  . Insulin Pen Needle 32G X 4 MM MISC Use to inject insulin with insulin pen 100 each 4  . LIVALO 4 MG TABS TAKE 1 TABLET BY MOUTH ONCE DAILY 90 tablet 1  . losartan (COZAAR) 25 MG tablet Take 1.5 tablets (37.5 mg total) by mouth daily. 90 tablet 3  . meclizine (ANTIVERT) 12.5 MG tablet Take 1 tablet (12.5 mg total) by mouth 3 (three) times daily as needed for dizziness. 30 tablet 1  . metFORMIN (GLUCOPHAGE) 1000 MG tablet TAKE 1 TABLET BY MOUTH 2 TIMES A DAY 180 tablet 0  . metoprolol tartrate (LOPRESSOR) 25 MG tablet Take 1 tablet (25 mg total) by mouth 2 (two) times daily. 60 tablet 6  . Multiple Vitamin (MULTIVITAMIN WITH MINERALS) TABS tablet Take 1 tablet by mouth daily.     Marland Kitchen  NOVOLIN 70/30 RELION (70-30) 100 UNIT/ML injection Inject 15-65 Units into the skin See admin instructions.  Taking 65 units with breakfast and 15 units at lunch and 65 units with dinner.    . ondansetron (ZOFRAN ODT) 4 MG disintegrating tablet Take 1 tablet (4 mg total) by mouth every 8 (eight) hours as needed for nausea. 10 tablet 0  . Propylene Glycol (SYSTANE COMPLETE) 0.6 % SOLN Apply 1 drop to eye daily as needed (dry eyes).     . sodium chloride (OCEAN) 0.65 % SOLN nasal spray Place 1 spray into both nostrils as needed for congestion. 15 mL 0  . vitamin E 400 UNIT capsule Take 400 Units by mouth daily.     No current facility-administered medications for this visit.         Allergies  Allergen Reactions  . Atorvastatin     Myalgias   . Crestor [Rosuvastatin Calcium] Other (See Comments)    Stiffness and back pain  . Ezetimibe-Simvastatin Other (See Comments)    Leg cramps  . Celebrex [Celecoxib] Rash  . Levemir [Insulin Detemir] Rash  . Penicillins Other (See Comments)    SYNCOPE (Tolerated Ancef)  Has patient had a PCN reaction causing immediate rash, facial/tongue/throat swelling, SOB or lightheadedness with hypotension: Yes  Has patient had a PCN reaction causing severe rash involving mucus membranes or skin necrosis: No  Has patient had a PCN reaction that required hospitalization No  Has patient had a PCN reaction occurring within the last 10 years: No  If all of the above answers are "NO", then may proceed with Cephalosporin use.   Review of Systems:  General: normal appetite, decreased energy, no weight gain, no weight loss, no fever  Cardiac: no chest pain with exertion, no chest pain at rest, +SOB with exertion, + resting SOB, no PND, + orthopnea, no palpitations, + arrhythmia, + atrial fibrillation, + LE edema, no dizzy spells, no syncope  Respiratory: + shortness of breath, no home oxygen, no productive cough, no dry cough, no bronchitis, no wheezing, no hemoptysis, no asthma, no pain with inspiration or cough, + sleep apnea, does not use her CPAP at night  GI: no difficulty  swallowing, no reflux, no frequent heartburn, no hiatal hernia, no abdominal pain, no constipation, no diarrhea, no hematochezia, no hematemesis, no melena  GU: no dysuria, no frequency, no urinary tract infection, no hematuria, no kidney stones, + kidney disease  Vascular: no pain suggestive of claudication, no pain in feet, no leg cramps, no varicose veins, no DVT, no non-healing foot ulcer  Neuro: no stroke, + TIA's, no seizures, no headaches, no temporary blindness one eye, no slurred speech, + peripheral neuropathy, no chronic pain, no instability of gait, + memory/cognitive dysfunction  Musculoskeletal: no arthritis, no joint swelling, no myalgias, no difficulty walking, normal mobility  Skin: no rash, no itching, no skin infections, no pressure sores or ulcerations  Psych: no anxiety, no depression, no nervousness, no unusual recent stress  Eyes: no blurry vision, no floaters, no recent vision changes, no wears glasses or contacts  ENT: no hearing loss, no loose or painful teeth, no dentures, last saw dentist within last year  Hematologic: no easy bruising, no abnormal bleeding, no clotting disorder, no frequent epistaxis  Endocrine: + diabetes, does check CBG's at home  Physical Exam:  BP (!) 168/83  Pulse 100  Resp 20  Ht 5\' 7"  (1.702 m)  Wt 218 lb (98.9 kg)  SpO2 94% Comment: RA  BMI 34.14  kg/m  General: Obese, well-appearing  HEENT: Unremarkable, NCAT, PERLA, EOMI, oropharynx clear  Neck: no JVD, no bruits, no adenopathy or thyromegaly  Chest: clear to auscultation, symmetrical breath sounds, no wheezes, no rhonchi  CV: RRR, grade III/VI crescendo/decrescendo murmur heard best at RSB, no diastolic murmur  Abdomen: soft, non-tender, no masses  Extremities: warm, well-perfused, pulses not palpable in feet, mild LE edema  Rectal/GU Deferred  Neuro: Grossly non-focal and symmetrical throughout  Skin: Clean and dry, no rashes, no breakdown  Diagnostic Tests:  *[place]*    *[facility]*  [address], [place] [address]  [phone]  -------------------------------------------------------------------  Transesophageal Echocardiography  Patient: Kherington, Meraz  MR #: [id number]  Study Date: 02/16/2018  Gender: F  Age: 14  Height: 170.2 cm  Weight: 99.5 kg  BSA: 2.21 m^2  Pt. Status:  Room:  ADMITTING Sanda Klein, MD  ATTENDING Sanda Klein, MD  PERFORMING Lyman Bishop MD  SONOGRAPHER Merrie Roof, RDCS  ORDERING Kroeger, Lorelee Cover.  REFERRING Kroeger, Daleen Snook M.  cc:  -------------------------------------------------------------------  LV EF: 65% - 70%  -------------------------------------------------------------------  Indications: Atrial fibrillation - currently SR 427.31.  -------------------------------------------------------------------  Study Conclusions  - Left ventricle: Severe LVH. Small LV cavity. Systolic function  was vigorous. The estimated ejection fraction was in the range of  65% to 70%. Wall motion was normal; there were no regional wall  motion abnormalities.  - Aortic valve: Calcified with restricted leaflet motion,  especially the non-coronary cusp which appears immobile. Severe  stenosis. Mean gradient (S): 32 mm Hg. Peak gradient (S): 57 mm  Hg. Valve area (VTI): 0.9 cm^2. Valve area (Vmax): 0.92 cm^2.  Valve area (Vmean): 0.7 cm^2.  - Mitral valve: Mildly calcified annulus. Sclerotic leafelts,  possibly rheumatic. Mild regurgitation.  - Left atrium: The atrium was dilated. No evidence of thrombus in  the atrial cavity or appendage.  - Right atrium: The atrium was dilated.  - Atrial septum: No defect or patent foramen ovale was identified.  - Tricuspid valve: There was mild regurgitation.  - Pericardium, extracardiac: Small pericardial effusion.  Impressions:  - LVEF 65-70%, severe LVH, severe aortic stenosis - mean gradient  of 32 mmHg - AVA 0.9 cm2, small pericardial effusion.  Successful  DCCV to junctional rhythm.  -------------------------------------------------------------------  Study data: Study status: Routine. Consent: The risks,  benefits, and alternatives to the procedure were explained to the  patient and informed consent was obtained. Procedure: Initial  setup. The patient was brought to the laboratory. Surface ECG leads  were monitored. Sedation. Deep sedation was administered by  anesthesiology staff. Transesophageal echocardiography. Topical  anesthesia was obtained using viscous lidocaine. An adult  multiplane transesophageal probe was inserted by the attending  cardiologistwithout difficulty. Image quality was adequate. Study  completion: The patient tolerated the procedure well. There were  no complications. Diagnostic transesophageal  echocardiography. 2D and color Doppler. Birthdate: Patient  birthdate: [birth year]. Age: Patient is 67 yr old. Sex: Gender:  female. BMI: 34.3 kg/m^2. Blood pressure: 80/57 Patient  status: Inpatient. Study date: Study date: 02/16/2018. Study  time: 01:34 PM. Location: Endoscopy.  -------------------------------------------------------------------  -------------------------------------------------------------------  Left ventricle: Severe LVH. Small LV cavity. Systolic function was  vigorous. The estimated ejection fraction was in the range of 65%  to 70%. Wall motion was normal; there were no regional wall motion  abnormalities.  -------------------------------------------------------------------  Aortic valve: Calcified with restricted leaflet motion, especially  the non-coronary cusp which appears immobile. Severe stenosis.  Doppler:  VTI ratio of LVOT to aortic valve: 0.32. Valve area  (VTI): 0.9 cm^2. Indexed valve area (VTI): 0.41 cm^2/m^2. Peak  velocity ratio of LVOT to aortic valve: 0.32. Valve area (Vmax):  0.92 cm^2. Indexed valve area (Vmax): 0.42 cm^2/m^2. Mean velocity  ratio of LVOT to aortic  valve: 0.25. Valve area (Vmean): 0.7 cm^2.  Indexed valve area (Vmean): 0.32 cm^2/m^2. Mean gradient (S): 32  mm Hg. Peak gradient (S): 57 mm Hg.  -------------------------------------------------------------------  Mitral valve: Mildly calcified annulus. Sclerotic leafelts,  possibly rheumatic. Mild regurgitation.  -------------------------------------------------------------------  Left atrium: The atrium was dilated. No evidence of thrombus in  the atrial cavity or appendage.  -------------------------------------------------------------------  Atrial septum: No defect or patent foramen ovale was identified.  -------------------------------------------------------------------  Right ventricle: Poorly visualized.  -------------------------------------------------------------------  Pulmonic valve: Poorly visualized. Doppler: There was trivial  regurgitation.  -------------------------------------------------------------------  Tricuspid valve: Doppler: There was mild regurgitation.  -------------------------------------------------------------------  Pulmonary artery: The main pulmonary artery was normal-sized.  -------------------------------------------------------------------  Right atrium: The atrium was dilated.  -------------------------------------------------------------------  Pericardium: Small pericardial effusion.  -------------------------------------------------------------------  Measurements  Left ventricle Value  Stroke volume, 2D 62 ml  Stroke volume/bsa, 2D 28 ml/m^2  LVOT Value  LVOT ID, S 19 mm  LVOT area 2.84 cm^2  LVOT peak velocity, S 122 cm/s  LVOT mean velocity, S 63.4 cm/s  LVOT VTI, S 22 cm  LVOT peak gradient, S 6 mm Hg  Aortic valve Value  Aortic valve peak velocity, S 377 cm/s  Aortic valve mean velocity, S 257 cm/s  Aortic valve VTI, S 69.4 cm  Aortic mean gradient, S 32 mm Hg  Aortic peak gradient, S 57 mm Hg  VTI ratio, LVOT/AV 0.32    Aortic valve area, VTI 0.9 cm^2  Aortic valve area/bsa, VTI 0.41 cm^2/m^2  Velocity ratio, peak, LVOT/AV 0.32  Aortic valve area, peak velocity 0.92 cm^2  Aortic valve area/bsa, peak velocity 0.42 cm^2/m^2  Velocity ratio, mean, LVOT/AV 0.25  Aortic valve area, mean velocity 0.7 cm^2  Aortic valve area/bsa, mean velocity 0.32 cm^2/m^2  Legend:  (L) and (H) mark values outside specified reference range.  -------------------------------------------------------------------  Prepared and Electronically Authenticated by  Lyman Bishop MD  2019-07-05T15:51:24  Physicians  Panel Physicians Referring Physician Case Authorizing Physician  Burnell Blanks, MD (Primary)    Procedures  RIGHT/LEFT HEART CATH AND CORONARY ANGIOGRAPHY  Conclusion  Ost RCA to Prox RCA lesion is 50% stenosed.  Mid RCA lesion is 40% stenosed.  Mid RCA to Dist RCA lesion is 20% stenosed.  Ost RPDA to RPDA lesion is 40% stenosed.  Ost Cx lesion is 50% stenosed.  Prox Cx lesion is 65% stenosed.  Mid Cx lesion is 70% stenosed.  Ost 3rd Mrg lesion is 20% stenosed.  Mid LM to Dist LM lesion is 25% stenosed.  Mid LAD to Dist LAD lesion is 99% stenosed.  Ost LAD to Prox LAD lesion is 20% stenosed.  Hemodynamic findings consistent with moderate pulmonary hypertension. 1. Moderate disease in the ostium of the RCA and in the mid RCA.  2. Moderate disease in the proximal segment of the Circumflex and in the distal OM branch.  3. Mild proximal LAD stenosis. The distal LAD tapers to a small caliber vessel and is diffusely diseased with 99% stenosis, too small for PCI.  4. Elevated filling pressures.  5. Moderately severe to Severe aortic stenosis (mean gradient 29.6 mmHg, peak to peak gradient 34 mmHg, AVA 1.11 cm2). Echo images demonstrate a heavily calcified  and thickened valve with fusion of two of the leaflets. I think her aortic stenosis is in the severe range.  Recommendations: Medical management of CAD. Will  continue with workup for TAVR vs AVR. I suspect that she will be a better candidate for TAVR given her co-morbidities. She may benefit from diuresis.   Indications  Severe aortic stenosis [I35.0 (ICD-10-CM)]  Procedural Details/Technique  Technical Details Indication: 67 yo female with history of diabetes mellitus, stage 3 chronic kidney disease, CAD, hyperlipidemia, GERD, HTN, morbid obesity, sleep apnea, prior TIA and severe aortic valve stenosis who is here today for cardiac cath . She is felt to have moderate to severe AS by echo. She had non-obstructive CAD by cath in 2006. She was admitted to [facility] 02/14/18 with c/o chest pain and was found to have atrial fibrillation/flutter. She underwent TEE guided DCCV and converted to sinus. Her troponin was found to be elevated, up to 7.1. Cath was deferred given the fact that she was fully anti-coagulated before and after her cardioversion. TEE showed normal LV systolic function. The aortic valve was calcified with limited leaflet mobility. Mean gradient 32 mmHg, peak gradient 57 mmHg, AVA 0.92 cm2, DVI 0.32. She has been on Eliquis following TEE guided DCCV on 02/16/18.   Procedure: The risks, benefits, complications, treatment options, and expected outcomes were discussed with the patient. The patient and/or family concurred with the proposed plan, giving informed consent. The patient was brought to the cath lab after IV hydration was given. The patient was further sedated with Versed and Fentanyl. The right wrist was prepped and draped in a sterile fashion. 1% lidocaine was used for local anesthesia. Using the modified Seldinger access technique, a 5 French sheath was placed in the right radial artery. 3 mg Verapamil was given through the sheath. 5000 units IV heparin was given. I changed out the IV catheter in the right antecubital vein for a 5 French sheath. Right heart cath performed with a balloon tipped catheter. Standard diagnostic catheters were used to  perform selective coronary angiography. I crossed the aortic valve with an AL-1 catheter and a straight wire. The sheath was removed from the right radial artery and a Terumo hemostasis band was applied at the arteriotomy site on the right wrist.     Estimated blood loss <50 mL.  During this procedure the patient was administered the following to achieve and maintain moderate conscious sedation: Versed 1 mg, Fentanyl 25 mcg, while the patient's heart rate, blood pressure, and oxygen saturation were continuously monitored. The period of conscious sedation was 38 minutes, of which I was present face-to-face 100% of this time.  Complications  Complications documented before study signed (03/29/2018 12:08 PM EDT)   RIGHT/LEFT HEART CATH AND CORONARY ANGIOGRAPHY   None Documented by Burnell Blanks, MD 03/29/2018 12:02 PM EDT  Time Range: Intraprocedure    Coronary Findings  Diagnostic  Dominance: Right  Left Main  Mid LM to Dist LM lesion 25% stenosed  Mid LM to Dist LM lesion is 25% stenosed.  Left Anterior Descending  Ost LAD to Prox LAD lesion 20% stenosed  Ost LAD to Prox LAD lesion is 20% stenosed.  Mid LAD to Dist LAD lesion 99% stenosed  Mid LAD to Dist LAD lesion is 99% stenosed.  First Diagonal Branch  Vessel is small in size.  Second Diagonal Branch  Vessel is small in size.  Left Circumflex  Ost Cx lesion 50% stenosed  Ost Cx lesion is 50% stenosed.  Prox Cx lesion 65% stenosed  Prox Cx lesion is 65% stenosed.  Mid Cx lesion 70% stenosed  Mid Cx lesion is 70% stenosed.  Third Obtuse Marginal Branch  Ost 3rd Mrg lesion 20% stenosed  Ost 3rd Mrg lesion is 20% stenosed.  Right Coronary Artery  Vessel is large.  Ost RCA to Prox RCA lesion 50% stenosed  Ost RCA to Prox RCA lesion is 50% stenosed.  Mid RCA lesion 40% stenosed  Mid RCA lesion is 40% stenosed.  Mid RCA to Dist RCA lesion 20% stenosed  Mid RCA to Dist RCA lesion is 20% stenosed.  Right Posterior  Descending Artery  Ost RPDA to RPDA lesion 40% stenosed  Ost RPDA to RPDA lesion is 40% stenosed.  Intervention  No interventions have been documented.  Right Heart  Right Heart Pressures Hemodynamic findings consistent with moderate pulmonary hypertension. Elevated LV EDP consistent with volume overload.  Coronary Diagrams  Diagnostic Diagram     Implants     No implant documentation for this case.  MERGE Images  Link to Procedure Log   Show images for CARDIAC CATHETERIZATION Procedure Log  Hemo Data   Most Recent Value  RA A Wave 19 mmHg  RA V Wave 17 mmHg  RA Mean 14 mmHg  RV Systolic Pressure 63 mmHg  RV Diastolic Pressure 8 mmHg  RV EDP 18 mmHg  PA Systolic Pressure 56 mmHg  PA Diastolic Pressure 17 mmHg  PA Mean 38 mmHg  PW A Wave 27 mmHg  PW V Wave 38 mmHg  PW Mean 28 mmHg  AO Systolic Pressure 496 mmHg  AO Diastolic Pressure 72 mmHg  AO Mean 759 mmHg  LV Systolic Pressure 163 mmHg  LV Diastolic Pressure 22 mmHg  LV EDP 26 mmHg  AOp Systolic Pressure 846 mmHg  AOp Diastolic Pressure 78 mmHg  AOp Mean Pressure 659 mmHg  LVp Systolic Pressure 935 mmHg  LVp Diastolic Pressure 26 mmHg  LVp EDP Pressure 37 mmHg   ADDENDUM REPORT: 04/19/2018 15:18  EXAM:  OVER-READ INTERPRETATION CT CHEST  The following report is an over-read performed by radiologist Dr.  Rebekah Chesterfield [facility] on 04/19/2018. This  over-read does not include interpretation of cardiac or coronary  anatomy or pathology. The cardiac CTA interpretation by the  cardiologist is attached.  COMPARISON: None.  FINDINGS:  Extracardiac findings will be described separately under dictation  for contemporaneously obtained CTA chest, abdomen and pelvis.  IMPRESSION:  Please see separate dictation for contemporaneously obtained CTA  chest, abdomen and pelvis dated 04/19/2018 for full description of  relevant extracardiac findings.  Electronically Signed  By: Vinnie Langton M.D.  On:  04/19/2018 15:18   Addended by Etheleen Mayhew, MD on 04/19/2018 3:20 PM  Study Result   CLINICAL DATA: 67 year old female with severe aortic stenosis being  evaluated for a TAVR procedure.  EXAM:  Cardiac TAVR CT  TECHNIQUE:  The patient was scanned on a [employer]. A 120 kV  retrospective scan was triggered in the descending thoracic aorta at  111 HU's. Gantry rotation speed was 250 msecs and collimation was .6  mm. No beta blockade or nitro were given. The 3D data set was  reconstructed in 5% intervals of the R-R cycle. Systolic and  diastolic phases were analyzed on a dedicated work station using  MPR, MIP and VRT modes. The patient received 80 cc of contrast.  FINDINGS:  Aortic Valve: Trileaflet, severely calcified and thickened aortic  valve with severely  decreased leaflet opening. There are minimal  calcifications extending into the LVOT.  Aorta: Normal size, mild diffuse calcifications. No dissection.  Sinotubular Junction: 29 x 28 mm  Ascending Thoracic Aorta: 31 x 30 mm  Aortic Arch: 24 x 23 mm  Descending Thoracic Aorta: 21 x 21 mm  Sinus of Valsalva Measurements:  Non-coronary: 29 mm  Right -coronary: 29 mm  Left -coronary: 30 mm  Coronary Artery Height above Annulus:  Left Main: 15 mm  Right Coronary: 17 mm  Virtual Basal Annulus Measurements:  Maximum/Minimum Diameter: 24.9 x 21.5 mm  Mean Diameter: 22.8 mm  Perimeter: 72.9 mm  Area: 407 mm2  Optimum Fluoroscopic Angle for Delivery: RAO 0 CRA 0  IMPRESSION:  1. Trileaflet, severely calcified and thickened aortic valve with  severely decreased leaflet opening. There are minimal calcifications  extending into the LVOT. Annular measurements are suitable for  delivery of a 23 mm Edwards-SAPIEN 3 valve or 29 mm Medtronic  CoreValve Evolut R.  2. Sufficient coronary to annulus distance.  3. Optimum Fluoroscopic Angle for Delivery: RAO 0 CRA 0  4. No thrombus in the left atrial appendage.    Electronically Signed:  By: Ena Dawley  On: 04/19/2018 13:04   CLINICAL DATA: 67 year old female with history of severe aortic  stenosis. Preprocedural study prior to potential transcatheter  aortic valve replacement (TAVR) procedure.  EXAM:  CT ANGIOGRAPHY CHEST, ABDOMEN AND PELVIS  TECHNIQUE:  Multidetector CT imaging through the chest, abdomen and pelvis was  performed using the standard protocol during bolus administration of  intravenous contrast. Multiplanar reconstructed images and MIPs were  obtained and reviewed to evaluate the vascular anatomy.  CONTRAST: 162mL ISOVUE-370 IOPAMIDOL (ISOVUE-370) INJECTION 76%  COMPARISON: Chest CT 05/07/2011. CT the abdomen and pelvis  01/03/2017.  FINDINGS:  CTA CHEST FINDINGS  Cardiovascular: Heart size is mildly enlarged with left ventricular  concentric hypertrophy. Small amount of pericardial fluid and/or  thickening. No pericardial calcification. There is aortic  atherosclerosis, as well as atherosclerosis of the great vessels of  the mediastinum and the coronary arteries, including calcified  atherosclerotic plaque in the left main, left anterior descending,  left circumflex and right coronary arteries. Severe thickening  calcification of the aortic valve. Severe calcifications of the  mitral annulus.  Mediastinum/Lymph Nodes: No pathologically enlarged mediastinal or  hilar lymph nodes. Esophagus is unremarkable in appearance. No  axillary lymphadenopathy.  Lungs/Pleura: Small bilateral pleural effusions. Mild ground-glass  attenuation and interlobular septal thickening in the lungs  bilaterally, most evident throughout the mid to lower lungs,  concerning for a background of mild interstitial pulmonary edema. No  confluent consolidative airspace disease. No suspicious appearing  pulmonary nodules or masses are noted.  Musculoskeletal/Soft Tissues: There are no aggressive appearing  lytic or blastic lesions noted in the  visualized portions of the  skeleton.  CTA ABDOMEN AND PELVIS FINDINGS  Hepatobiliary: No suspicious cystic or solid hepatic lesions. No  intra or extrahepatic biliary ductal dilatation. Status post  cholecystectomy.  Pancreas: No pancreatic mass. No pancreatic ductal dilatation. No  pancreatic or peripancreatic fluid or inflammatory changes.  Spleen: Small splenule adjacent to the spleen. Otherwise,  unremarkable.  Adrenals/Urinary Tract: Bilateral kidneys and bilateral adrenal  glands are normal in appearance. No hydroureteronephrosis. Urinary  bladder is normal in appearance.  Stomach/Bowel: Postoperative changes in the stomach suggestive of  prior sleeve gastrectomy. No pathologic dilatation of small bowel or  colon. Normal appendix.  Vascular/Lymphatic: Vascular findings and measurements pertinent to  potential TAVR  procedure, as detailed below. Aortic atherosclerosis,  without evidence of aneurysm or dissection in the abdominal or  pelvic vasculature. No lymphadenopathy noted in the abdomen or  pelvis.  Reproductive: Uterus and ovaries are unremarkable in appearance.  Other: No significant volume of ascites. No pneumoperitoneum.  Musculoskeletal: There are no aggressive appearing lytic or blastic  lesions noted in the visualized portions of the skeleton.  VASCULAR MEASUREMENTS PERTINENT TO TAVR:  AORTA:  Minimal Aortic Diameter-12 x 13 mm  Severity of Aortic Calcification-severe  RIGHT PELVIS:  Right Common Iliac Artery -  Minimal Diameter-9.0 x 5.4 mm  Tortuosity-mild  Calcification-moderate  Right External Iliac Artery -  Minimal Diameter-6.4 x 6.5 mm  Tortuosity - mild  Calcification-none  Right Common Femoral Artery -  Minimal Diameter-8.2 x 8.2 mm  Tortuosity - mild  Calcification-none  LEFT PELVIS:  Left Common Iliac Artery -  Minimal Diameter-7.5 x 6.0 mm  Tortuosity - mild  Calcification-moderate  Left External Iliac Artery -  Minimal Diameter-7.9 x 6.5  mm  Tortuosity - mild  Calcification-none  Left Common Femoral Artery -  Minimal Diameter-7.5 x 7.2 mm  Tortuosity - mild  Calcification-mild  Review of the MIP images confirms the above findings.  IMPRESSION:  1. Vascular findings and measurements pertinent to potential TAVR  procedure, as detailed above.  2. Severe thickening calcification of the aortic valve, compatible  with reported clinical history of severe aortic stenosis.  3. Small amount of pericardial fluid and/or thickening. No  pericardial calcification.  4. Cardiomegaly with left ventricular hypertrophy.  5. Aortic atherosclerosis, in addition to left main and 3 vessel  coronary artery disease.  6. Additional incidental findings, as above.  Aortic Atherosclerosis (ICD10-I70.0).  Electronically Signed  By: Vinnie Langton M.D.  On: 04/19/2018 16:03  Pulmonary Function Test  Order: [id number]  Status: Edited Result - FINAL Visible to patient: No (Not Released) Next appt: 07/23/2018 at 10:40 AM in Cardiology Minus Breeding, MD) Dx: Severe aortic stenosis   Ref Range & Units 11d ago   FVC-Pre L 1.67    FVC-%Pred-Pre % 48    FVC-Post L 1.66    FVC-%Pred-Post % 47    FVC-%Change-Post % 0    FEV1-Pre L 1.45    FEV1-%Pred-Pre % 54    FEV1-Post L 1.49    FEV1-%Pred-Post % 56    FEV1-%Change-Post % 2    FEV6-Pre L 1.67    FEV6-%Pred-Pre % 50    FEV6-Post L 1.66    FEV6-%Pred-Post % 49    FEV6-%Change-Post % 0    Pre FEV1/FVC ratio % 87    FEV1FVC-%Pred-Pre % 112    Post FEV1/FVC ratio % 90    FEV1FVC-%Change-Post % 3    Pre FEV6/FVC Ratio % 100    FEV6FVC-%Pred-Pre % 104    Post FEV6/FVC ratio % 100    FEV6FVC-%Pred-Post % 104    FEF 25-75 Pre L/sec 1.92    FEF2575-%Pred-Pre % 87    FEF 25-75 Post L/sec 2.27    FEF2575-%Pred-Post % 103    FEF2575-%Change-Post % 18    RV L 1.55    RV % pred % 67    TLC L 3.33    TLC % pred % 60    DLCO unc ml/min/mmHg 14.12    DLCO unc % pred % 49    DL/VA ml/min/mmHg/L  5.19    DL/VA % pred % 100    Resulting Agency  [facility]      Specimen Collected: 04/19/18  07:31 Last Resulted: 04/19/18 08:27    STS Risk Score:  Procedure: Isolated AVR  Risk of Mortality: 2.614%  Renal Failure: 5.302%  Permanent Stroke: 2.024%  Prolonged Ventilation: 12.000%  DSW Infection: 0.176%  Reoperation: 2.440%  Morbidity or Mortality: 19.419%  Short Length of Stay: 22.158%  Long Length of Stay: 9.723%   Impression:   This 67 year old woman has stage D, severe, symptomatic aortic stenosis with New York Heart Association class III symptoms of exertional fatigue and shortness of breath as well as some occasional chest discomfort. I have personally reviewed her echocardiogram, cardiac catheterization, and CTA studies. Her echo shows a trileaflet aortic valve with calcification of the leaflets and restricted motion. The mean gradient is 32 mmHg and the valve area is 0.7 to 0.92 cm. Left ventricular ejection fraction is 65 to 70%. Cardiac catheterization shows moderate nonobstructive coronary disease in the left circumflex and right coronary artery territories with 90% diffusely diseased distal LAD that is not suitable for revascularization. I agree that aortic valve replacement is the best treatment for this patient. Her STS risk of mortality for open surgical aortic valve replacement is only 2.6% but I think her operative risk will be higher due to her morbid obesity and severe restrictive lung disease with moderate reduction in her diffusion capacity. I think transcatheter aortic valve replacement would be the best option for her with less risk and a much quicker recovery. I have personally reviewed her CT images. Her gated cardiac CTA shows anatomy that is suitable for transcatheter aortic valve replacement. She has a relatively small annulus that sizes to a 23 mm Sapien valve. Her abdominal and pelvic CTA shows pelvic vascular anatomy that is suitable for transfemoral insertion.   The  patient and her husband were counseled at length regarding treatment alternatives for management of severe symptomatic aortic stenosis. The risks and benefits of surgical intervention has been discussed in detail. Long-term prognosis with medical therapy was discussed. Alternative approaches such as conventional surgical aortic valve replacement, transcatheter aortic valve replacement, and palliative medical therapy were compared and contrasted at length. This discussion was placed in the context of the patient's own specific clinical presentation and past medical history. All of their questions have been addressed.   Following the decision to proceed with transcatheter aortic valve replacement, a discussion was held regarding what types of management strategies would be attempted intraoperatively in the event of life-threatening complications, including whether or not the patient would be considered a candidate for the use of cardiopulmonary bypass and/or conversion to open sternotomy for attempted surgical intervention.   The patient has been advised of a variety of complications that might develop including but not limited to risks of death, stroke, paravalvular leak, aortic dissection or other major vascular complications, aortic annulus rupture, device embolization, cardiac rupture or perforation, mitral regurgitation, acute myocardial infarction, arrhythmia, heart block or bradycardia requiring permanent pacemaker placement, congestive heart failure, respiratory failure, renal failure, pneumonia, infection, other late complications related to structural valve deterioration or migration, or other complications that might ultimately cause a temporary or permanent loss of functional independence or other long term morbidity. The patient provides full informed consent for the procedure as described and all questions were answered.   Plan:   Transfemoral TAVR using a Sapien 3 valve.   Gaye Pollack,  MD

## 2018-05-08 ENCOUNTER — Inpatient Hospital Stay (HOSPITAL_COMMUNITY)
Admission: RE | Admit: 2018-05-08 | Discharge: 2018-05-10 | DRG: 266 | Disposition: A | Discharge status: Home / Self Care | Payer: Medicare Other | Source: Ambulatory Visit | Attending: Cardiovascular Disease | Admitting: Cardiovascular Disease

## 2018-05-08 ENCOUNTER — Encounter (HOSPITAL_COMMUNITY): Payer: Self-pay

## 2018-05-08 ENCOUNTER — Inpatient Hospital Stay (HOSPITAL_COMMUNITY): Payer: Medicare Other

## 2018-05-08 ENCOUNTER — Other Ambulatory Visit: Payer: Self-pay | Admitting: Physician Assistant

## 2018-05-08 ENCOUNTER — Inpatient Hospital Stay (HOSPITAL_COMMUNITY): Payer: Medicare Other | Admitting: Physician Assistant

## 2018-05-08 ENCOUNTER — Other Ambulatory Visit: Payer: Self-pay

## 2018-05-08 ENCOUNTER — Encounter (HOSPITAL_COMMUNITY)
Admission: RE | Disposition: A | Discharge status: Home / Self Care | Payer: Self-pay | Source: Ambulatory Visit | Attending: Cardiovascular Disease

## 2018-05-08 ENCOUNTER — Inpatient Hospital Stay (HOSPITAL_COMMUNITY): Payer: Medicare Other | Admitting: Anesthesiology

## 2018-05-08 DIAGNOSIS — E1159 Type 2 diabetes mellitus with other circulatory complications: Secondary | ICD-10-CM | POA: Diagnosis present

## 2018-05-08 DIAGNOSIS — I480 Paroxysmal atrial fibrillation: Secondary | ICD-10-CM | POA: Diagnosis not present

## 2018-05-08 DIAGNOSIS — E1151 Type 2 diabetes mellitus with diabetic peripheral angiopathy without gangrene: Secondary | ICD-10-CM | POA: Diagnosis present

## 2018-05-08 DIAGNOSIS — I350 Nonrheumatic aortic (valve) stenosis: Secondary | ICD-10-CM

## 2018-05-08 DIAGNOSIS — K589 Irritable bowel syndrome without diarrhea: Secondary | ICD-10-CM | POA: Diagnosis present

## 2018-05-08 DIAGNOSIS — I481 Persistent atrial fibrillation: Secondary | ICD-10-CM | POA: Diagnosis present

## 2018-05-08 DIAGNOSIS — I5033 Acute on chronic diastolic (congestive) heart failure: Secondary | ICD-10-CM | POA: Diagnosis present

## 2018-05-08 DIAGNOSIS — R000 Tachycardia, unspecified: Secondary | ICD-10-CM | POA: Diagnosis not present

## 2018-05-08 DIAGNOSIS — E785 Hyperlipidemia, unspecified: Secondary | ICD-10-CM | POA: Diagnosis not present

## 2018-05-08 DIAGNOSIS — Z880 Allergy status to penicillin: Secondary | ICD-10-CM

## 2018-05-08 DIAGNOSIS — E1122 Type 2 diabetes mellitus with diabetic chronic kidney disease: Secondary | ICD-10-CM | POA: Diagnosis present

## 2018-05-08 DIAGNOSIS — Z823 Family history of stroke: Secondary | ICD-10-CM

## 2018-05-08 DIAGNOSIS — I313 Pericardial effusion (noninflammatory): Secondary | ICD-10-CM

## 2018-05-08 DIAGNOSIS — Z8673 Personal history of transient ischemic attack (TIA), and cerebral infarction without residual deficits: Secondary | ICD-10-CM

## 2018-05-08 DIAGNOSIS — Z89412 Acquired absence of left great toe: Secondary | ICD-10-CM

## 2018-05-08 DIAGNOSIS — Z952 Presence of prosthetic heart valve: Secondary | ICD-10-CM

## 2018-05-08 DIAGNOSIS — Z87442 Personal history of urinary calculi: Secondary | ICD-10-CM

## 2018-05-08 DIAGNOSIS — K219 Gastro-esophageal reflux disease without esophagitis: Secondary | ICD-10-CM | POA: Diagnosis present

## 2018-05-08 DIAGNOSIS — Z9842 Cataract extraction status, left eye: Secondary | ICD-10-CM

## 2018-05-08 DIAGNOSIS — I700 Atherosclerosis of aorta: Secondary | ICD-10-CM | POA: Diagnosis present

## 2018-05-08 DIAGNOSIS — G459 Transient cerebral ischemic attack, unspecified: Secondary | ICD-10-CM | POA: Diagnosis present

## 2018-05-08 DIAGNOSIS — Z881 Allergy status to other antibiotic agents status: Secondary | ICD-10-CM

## 2018-05-08 DIAGNOSIS — Z8672 Personal history of thrombophlebitis: Secondary | ICD-10-CM

## 2018-05-08 DIAGNOSIS — Z9884 Bariatric surgery status: Secondary | ICD-10-CM

## 2018-05-08 DIAGNOSIS — Z833 Family history of diabetes mellitus: Secondary | ICD-10-CM

## 2018-05-08 DIAGNOSIS — G4733 Obstructive sleep apnea (adult) (pediatric): Secondary | ICD-10-CM | POA: Diagnosis present

## 2018-05-08 DIAGNOSIS — I10 Essential (primary) hypertension: Secondary | ICD-10-CM | POA: Diagnosis present

## 2018-05-08 DIAGNOSIS — N183 Chronic kidney disease, stage 3 unspecified: Secondary | ICD-10-CM | POA: Diagnosis present

## 2018-05-08 DIAGNOSIS — Z8249 Family history of ischemic heart disease and other diseases of the circulatory system: Secondary | ICD-10-CM

## 2018-05-08 DIAGNOSIS — Z794 Long term (current) use of insulin: Secondary | ICD-10-CM

## 2018-05-08 DIAGNOSIS — Z7901 Long term (current) use of anticoagulants: Secondary | ICD-10-CM

## 2018-05-08 DIAGNOSIS — K449 Diaphragmatic hernia without obstruction or gangrene: Secondary | ICD-10-CM | POA: Diagnosis present

## 2018-05-08 DIAGNOSIS — Z961 Presence of intraocular lens: Secondary | ICD-10-CM | POA: Diagnosis present

## 2018-05-08 DIAGNOSIS — M810 Age-related osteoporosis without current pathological fracture: Secondary | ICD-10-CM | POA: Diagnosis present

## 2018-05-08 DIAGNOSIS — I4891 Unspecified atrial fibrillation: Secondary | ICD-10-CM | POA: Diagnosis present

## 2018-05-08 DIAGNOSIS — Z9049 Acquired absence of other specified parts of digestive tract: Secondary | ICD-10-CM

## 2018-05-08 DIAGNOSIS — R9389 Abnormal findings on diagnostic imaging of other specified body structures: Secondary | ICD-10-CM | POA: Diagnosis present

## 2018-05-08 DIAGNOSIS — E669 Obesity, unspecified: Secondary | ICD-10-CM | POA: Diagnosis present

## 2018-05-08 DIAGNOSIS — I447 Left bundle-branch block, unspecified: Secondary | ICD-10-CM | POA: Diagnosis present

## 2018-05-08 DIAGNOSIS — G4730 Sleep apnea, unspecified: Secondary | ICD-10-CM | POA: Diagnosis present

## 2018-05-08 DIAGNOSIS — I130 Hypertensive heart and chronic kidney disease with heart failure and stage 1 through stage 4 chronic kidney disease, or unspecified chronic kidney disease: Secondary | ICD-10-CM | POA: Diagnosis not present

## 2018-05-08 DIAGNOSIS — J449 Chronic obstructive pulmonary disease, unspecified: Secondary | ICD-10-CM | POA: Diagnosis present

## 2018-05-08 DIAGNOSIS — I129 Hypertensive chronic kidney disease with stage 1 through stage 4 chronic kidney disease, or unspecified chronic kidney disease: Secondary | ICD-10-CM | POA: Diagnosis not present

## 2018-05-08 DIAGNOSIS — Z79899 Other long term (current) drug therapy: Secondary | ICD-10-CM

## 2018-05-08 DIAGNOSIS — E1129 Type 2 diabetes mellitus with other diabetic kidney complication: Secondary | ICD-10-CM | POA: Diagnosis present

## 2018-05-08 DIAGNOSIS — I4819 Other persistent atrial fibrillation: Secondary | ICD-10-CM | POA: Diagnosis present

## 2018-05-08 DIAGNOSIS — Z006 Encounter for examination for normal comparison and control in clinical research program: Secondary | ICD-10-CM

## 2018-05-08 DIAGNOSIS — N189 Chronic kidney disease, unspecified: Secondary | ICD-10-CM | POA: Diagnosis present

## 2018-05-08 DIAGNOSIS — I272 Pulmonary hypertension, unspecified: Secondary | ICD-10-CM | POA: Diagnosis present

## 2018-05-08 DIAGNOSIS — Z9841 Cataract extraction status, right eye: Secondary | ICD-10-CM

## 2018-05-08 DIAGNOSIS — I152 Hypertension secondary to endocrine disorders: Secondary | ICD-10-CM | POA: Diagnosis present

## 2018-05-08 DIAGNOSIS — N184 Chronic kidney disease, stage 4 (severe): Secondary | ICD-10-CM | POA: Diagnosis present

## 2018-05-08 DIAGNOSIS — Z23 Encounter for immunization: Secondary | ICD-10-CM | POA: Diagnosis not present

## 2018-05-08 DIAGNOSIS — Z954 Presence of other heart-valve replacement: Secondary | ICD-10-CM | POA: Diagnosis not present

## 2018-05-08 DIAGNOSIS — Z888 Allergy status to other drugs, medicaments and biological substances status: Secondary | ICD-10-CM

## 2018-05-08 DIAGNOSIS — I2510 Atherosclerotic heart disease of native coronary artery without angina pectoris: Secondary | ICD-10-CM | POA: Diagnosis present

## 2018-05-08 DIAGNOSIS — Z6834 Body mass index (BMI) 34.0-34.9, adult: Secondary | ICD-10-CM

## 2018-05-08 DIAGNOSIS — I3139 Other pericardial effusion (noninflammatory): Secondary | ICD-10-CM

## 2018-05-08 HISTORY — PX: TRANSCATHETER AORTIC VALVE REPLACEMENT, TRANSFEMORAL: SHX6400

## 2018-05-08 HISTORY — DX: Unspecified atrial fibrillation: I48.91

## 2018-05-08 HISTORY — DX: Presence of prosthetic heart valve: Z95.2

## 2018-05-08 HISTORY — PX: INTRAOPERATIVE TRANSTHORACIC ECHOCARDIOGRAM: SHX6523

## 2018-05-08 LAB — POCT I-STAT, CHEM 8
BUN: 39 mg/dL — ABNORMAL HIGH (ref 8–23)
BUN: 38 mg/dL — ABNORMAL HIGH (ref 8–23)
BUN: 38 mg/dL — ABNORMAL HIGH (ref 8–23)
Calcium, Ion: 1.13 mmol/L — ABNORMAL LOW (ref 1.15–1.40)
Calcium, Ion: 1.17 mmol/L (ref 1.15–1.40)
Calcium, Ion: 1.14 mmol/L — ABNORMAL LOW (ref 1.15–1.40)
Chloride: 105 mmol/L (ref 98–111)
Chloride: 104 mmol/L (ref 98–111)
Chloride: 104 mmol/L (ref 98–111)
Creatinine, Ser: 1.6 mg/dL — ABNORMAL HIGH (ref 0.44–1.00)
Creatinine, Ser: 1.6 mg/dL — ABNORMAL HIGH (ref 0.44–1.00)
Creatinine, Ser: 1.7 mg/dL — ABNORMAL HIGH (ref 0.44–1.00)
Glucose, Bld: 157 mg/dL — ABNORMAL HIGH (ref 70–99)
Glucose, Bld: 215 mg/dL — ABNORMAL HIGH (ref 70–99)
Glucose, Bld: 251 mg/dL — ABNORMAL HIGH (ref 70–99)
HCT: 36 % (ref 36.0–46.0)
HCT: 35 % — ABNORMAL LOW (ref 36.0–46.0)
HCT: 35 % — ABNORMAL LOW (ref 36.0–46.0)
Hemoglobin: 12.2 g/dL (ref 12.0–15.0)
Hemoglobin: 11.9 g/dL — ABNORMAL LOW (ref 12.0–15.0)
Hemoglobin: 11.9 g/dL — ABNORMAL LOW (ref 12.0–15.0)
Potassium: 3.5 mmol/L (ref 3.5–5.1)
Potassium: 3.9 mmol/L (ref 3.5–5.1)
Potassium: 4.3 mmol/L (ref 3.5–5.1)
Sodium: 143 mmol/L (ref 135–145)
Sodium: 142 mmol/L (ref 135–145)
Sodium: 142 mmol/L (ref 135–145)
TCO2: 28 mmol/L (ref 22–32)
TCO2: 29 mmol/L (ref 22–32)
TCO2: 26 mmol/L (ref 22–32)

## 2018-05-08 LAB — GLUCOSE, CAPILLARY
Glucose-Capillary: 147 mg/dL — ABNORMAL HIGH (ref 70–99)
Glucose-Capillary: 242 mg/dL — ABNORMAL HIGH (ref 70–99)
Glucose-Capillary: 328 mg/dL — ABNORMAL HIGH (ref 70–99)
Glucose-Capillary: 199 mg/dL — ABNORMAL HIGH (ref 70–99)

## 2018-05-08 LAB — PROTIME-INR
INR: 1
Prothrombin Time: 13.1 seconds (ref 11.4–15.2)

## 2018-05-08 SURGERY — IMPLANTATION, AORTIC VALVE, TRANSCATHETER, FEMORAL APPROACH
Anesthesia: Monitor Anesthesia Care | Site: Chest

## 2018-05-08 MED ORDER — LIDOCAINE HCL (PF) 1 % IJ SOLN
INTRAMUSCULAR | Status: AC
  Filled 2018-05-08: qty 30

## 2018-05-08 MED ORDER — PHENYLEPHRINE HCL-NACL 20-0.9 MG/250ML-% IV SOLN: 0.0000 ug/min | INTRAVENOUS | Status: DC

## 2018-05-08 MED ORDER — CLEVIDIPINE BUTYRATE 0.5 MG/ML IV EMUL
1.0000 mg/h | INTRAVENOUS | Status: AC
  Administered 2018-05-08: 2 mg/h via INTRAVENOUS
  Filled 2018-05-08: qty 50

## 2018-05-08 MED ORDER — SODIUM CHLORIDE 0.9 % IV SOLN: INTRAVENOUS | Status: DC

## 2018-05-08 MED ORDER — ACETAMINOPHEN 650 MG RE SUPP: 650.0000 mg | Freq: Four times a day (QID) | RECTAL | Status: DC | PRN

## 2018-05-08 MED ORDER — LOSARTAN POTASSIUM 25 MG PO TABS: 37.5000 mg | ORAL_TABLET | Freq: Every day | ORAL | Status: DC

## 2018-05-08 MED ORDER — PROPOFOL 10 MG/ML IV BOLUS
INTRAVENOUS | Status: DC | PRN
  Administered 2018-05-08: 5 mg via INTRAVENOUS

## 2018-05-08 MED ORDER — ONDANSETRON HCL 4 MG/2ML IJ SOLN
INTRAMUSCULAR | Status: AC
  Filled 2018-05-08: qty 2

## 2018-05-08 MED ORDER — CHLORHEXIDINE GLUCONATE 4 % EX LIQD: 60.0000 mL | Freq: Once | CUTANEOUS | Status: DC

## 2018-05-08 MED ORDER — PROTAMINE SULFATE 10 MG/ML IV SOLN
INTRAVENOUS | Status: DC | PRN
  Administered 2018-05-08: 130 mg via INTRAVENOUS

## 2018-05-08 MED ORDER — PROPOFOL 10 MG/ML IV BOLUS
INTRAVENOUS | Status: AC
  Filled 2018-05-08: qty 20

## 2018-05-08 MED ORDER — HYDRALAZINE HCL 50 MG PO TABS
50.0000 mg | ORAL_TABLET | ORAL | Status: DC | PRN
  Administered 2018-05-08: 50 mg via ORAL
  Filled 2018-05-08: qty 1

## 2018-05-08 MED ORDER — PROPOFOL 500 MG/50ML IV EMUL
INTRAVENOUS | Status: DC | PRN
  Administered 2018-05-08: 10 ug/kg/min via INTRAVENOUS

## 2018-05-08 MED ORDER — CHLORHEXIDINE GLUCONATE 4 % EX LIQD: 30.0000 mL | CUTANEOUS | Status: DC

## 2018-05-08 MED ORDER — AMIODARONE HCL 200 MG PO TABS
200.0000 mg | ORAL_TABLET | Freq: Every day | ORAL | Status: DC
  Administered 2018-05-08 – 2018-05-10 (×3): 200 mg via ORAL
  Filled 2018-05-08 (×3): qty 1

## 2018-05-08 MED ORDER — VANCOMYCIN HCL IN DEXTROSE 1-5 GM/200ML-% IV SOLN: 1000.0000 mg | Freq: Once | INTRAVENOUS | Status: DC

## 2018-05-08 MED ORDER — ONDANSETRON HCL 4 MG/2ML IJ SOLN
4.0000 mg | Freq: Four times a day (QID) | INTRAMUSCULAR | Status: DC | PRN
  Administered 2018-05-09 (×2): 4 mg via INTRAVENOUS
  Filled 2018-05-08 (×2): qty 2

## 2018-05-08 MED ORDER — IODIXANOL 320 MG/ML IV SOLN
INTRAVENOUS | Status: DC | PRN
  Administered 2018-05-08: 40.1 mL via INTRA_ARTERIAL

## 2018-05-08 MED ORDER — FENTANYL CITRATE (PF) 250 MCG/5ML IJ SOLN
INTRAMUSCULAR | Status: AC
  Filled 2018-05-08: qty 5

## 2018-05-08 MED ORDER — MIDAZOLAM HCL 2 MG/2ML IJ SOLN
INTRAMUSCULAR | Status: AC
  Filled 2018-05-08: qty 2

## 2018-05-08 MED ORDER — AMLODIPINE BESYLATE 5 MG PO TABS
5.0000 mg | ORAL_TABLET | Freq: Once | ORAL | Status: DC
  Filled 2018-05-08: qty 1

## 2018-05-08 MED ORDER — INSULIN ASPART 100 UNIT/ML ~~LOC~~ SOLN
0.0000 [IU] | Freq: Three times a day (TID) | SUBCUTANEOUS | Status: DC
  Administered 2018-05-08: 4 [IU] via SUBCUTANEOUS
  Administered 2018-05-08: 16 [IU] via SUBCUTANEOUS
  Administered 2018-05-08: 8 [IU] via SUBCUTANEOUS
  Administered 2018-05-09: 12 [IU] via SUBCUTANEOUS
  Administered 2018-05-09: 16 [IU] via SUBCUTANEOUS
  Administered 2018-05-09: 4 [IU] via SUBCUTANEOUS
  Administered 2018-05-09: 12 [IU] via SUBCUTANEOUS

## 2018-05-08 MED ORDER — SODIUM CHLORIDE 0.9 % IV SOLN
INTRAVENOUS | Status: DC | PRN
  Administered 2018-05-08: 1500 mL

## 2018-05-08 MED ORDER — LEVOFLOXACIN IN D5W 750 MG/150ML IV SOLN
750.0000 mg | Freq: Once | INTRAVENOUS | Status: AC
  Administered 2018-05-09: 750 mg via INTRAVENOUS
  Filled 2018-05-08: qty 150

## 2018-05-08 MED ORDER — 0.9 % SODIUM CHLORIDE (POUR BTL) OPTIME
TOPICAL | Status: DC | PRN
  Administered 2018-05-08: 1000 mL

## 2018-05-08 MED ORDER — ONDANSETRON HCL 4 MG/2ML IJ SOLN
INTRAMUSCULAR | Status: DC | PRN
  Administered 2018-05-08: 4 mg via INTRAVENOUS

## 2018-05-08 MED ORDER — PHENYLEPHRINE 40 MCG/ML (10ML) SYRINGE FOR IV PUSH (FOR BLOOD PRESSURE SUPPORT)
PREFILLED_SYRINGE | INTRAVENOUS | Status: AC
  Filled 2018-05-08: qty 10

## 2018-05-08 MED ORDER — NITROGLYCERIN IN D5W 200-5 MCG/ML-% IV SOLN: 0.0000 ug/min | INTRAVENOUS | Status: DC

## 2018-05-08 MED ORDER — LEVOFLOXACIN IN D5W 750 MG/150ML IV SOLN: 750.0000 mg | INTRAVENOUS | Status: DC

## 2018-05-08 MED ORDER — SODIUM CHLORIDE 0.9% FLUSH
3.0000 mL | Freq: Two times a day (BID) | INTRAVENOUS | Status: DC
  Administered 2018-05-08 – 2018-05-09 (×3): 3 mL via INTRAVENOUS

## 2018-05-08 MED ORDER — SODIUM CHLORIDE 0.9 % IV SOLN
INTRAVENOUS | Status: AC
  Filled 2018-05-08 (×3): qty 1.2

## 2018-05-08 MED ORDER — LACTATED RINGERS IV SOLN
INTRAVENOUS | Status: DC | PRN
  Administered 2018-05-08: 07:00:00 via INTRAVENOUS

## 2018-05-08 MED ORDER — OXYCODONE HCL 5 MG PO TABS: 5.0000 mg | ORAL_TABLET | ORAL | Status: DC | PRN

## 2018-05-08 MED ORDER — FENTANYL CITRATE (PF) 100 MCG/2ML IJ SOLN
INTRAMUSCULAR | Status: DC | PRN
  Administered 2018-05-08: 50 ug via INTRAVENOUS

## 2018-05-08 MED ORDER — HEPARIN SODIUM (PORCINE) 1000 UNIT/ML IJ SOLN
INTRAMUSCULAR | Status: DC | PRN
  Administered 2018-05-08: 13000 [IU] via INTRAVENOUS

## 2018-05-08 MED ORDER — ASPIRIN 81 MG PO CHEW
81.0000 mg | CHEWABLE_TABLET | Freq: Every day | ORAL | Status: DC
  Administered 2018-05-09 – 2018-05-10 (×2): 81 mg via ORAL
  Filled 2018-05-08 (×2): qty 1

## 2018-05-08 MED ORDER — METOPROLOL TARTRATE 5 MG/5ML IV SOLN: 2.5000 mg | INTRAVENOUS | Status: DC | PRN

## 2018-05-08 MED ORDER — SODIUM CHLORIDE 0.9% FLUSH: 3.0000 mL | INTRAVENOUS | Status: DC | PRN

## 2018-05-08 MED ORDER — INFLUENZA VAC SPLIT HIGH-DOSE 0.5 ML IM SUSY
0.5000 mL | PREFILLED_SYRINGE | INTRAMUSCULAR | Status: AC
  Administered 2018-05-09: 0.5 mL via INTRAMUSCULAR
  Filled 2018-05-08: qty 0.5

## 2018-05-08 MED ORDER — CHLORHEXIDINE GLUCONATE 0.12 % MT SOLN
15.0000 mL | Freq: Once | OROMUCOSAL | Status: AC
  Administered 2018-05-08: 15 mL via OROMUCOSAL
  Filled 2018-05-08: qty 15

## 2018-05-08 MED ORDER — SODIUM CHLORIDE 0.9 % IV SOLN: 250.0000 mL | INTRAVENOUS | Status: DC | PRN

## 2018-05-08 MED ORDER — SODIUM CHLORIDE 0.9 % IV SOLN
INTRAVENOUS | Status: DC
  Administered 2018-05-08: 10:00:00 via INTRAVENOUS

## 2018-05-08 MED ORDER — ACETAMINOPHEN 325 MG PO TABS
650.0000 mg | ORAL_TABLET | Freq: Four times a day (QID) | ORAL | Status: DC | PRN
  Administered 2018-05-09: 650 mg via ORAL
  Filled 2018-05-08: qty 2

## 2018-05-08 MED ORDER — MIDAZOLAM HCL 5 MG/5ML IJ SOLN
INTRAMUSCULAR | Status: DC | PRN
  Administered 2018-05-08: 0.5 mg via INTRAVENOUS

## 2018-05-08 MED ORDER — ALBUTEROL SULFATE (2.5 MG/3ML) 0.083% IN NEBU: 3.0000 mL | INHALATION_SOLUTION | Freq: Two times a day (BID) | RESPIRATORY_TRACT | Status: DC | PRN

## 2018-05-08 MED ORDER — LIDOCAINE HCL 1 % IJ SOLN
INTRAMUSCULAR | Status: DC | PRN
  Administered 2018-05-08: 20 mL

## 2018-05-08 MED ORDER — PROTAMINE SULFATE 10 MG/ML IV SOLN
INTRAVENOUS | Status: AC
  Filled 2018-05-08: qty 25

## 2018-05-08 MED ORDER — TRAMADOL HCL 50 MG PO TABS: 50.0000 mg | ORAL_TABLET | ORAL | Status: DC | PRN

## 2018-05-08 MED ORDER — MORPHINE SULFATE (PF) 2 MG/ML IV SOLN: 2.0000 mg | INTRAVENOUS | Status: DC | PRN

## 2018-05-08 SURGICAL SUPPLY — 90 items
ADH SKN CLS APL DERMABOND .7 (GAUZE/BANDAGES/DRESSINGS) ×3
BAG DECANTER FOR FLEXI CONT (MISCELLANEOUS) IMPLANT
BAG SNAP BAND KOVER 36X36 (MISCELLANEOUS) ×4 IMPLANT
BLADE CLIPPER SURG (BLADE) ×2 IMPLANT
BLADE OSCILLATING /SAGITTAL (BLADE) IMPLANT
BLADE STERNUM SYSTEM 6 (BLADE) IMPLANT
CABLE ADAPT CONN TEMP 6FT (ADAPTER) ×4 IMPLANT
CANNULA FEM VENOUS REMOTE 22FR (CANNULA) IMPLANT
CANNULA OPTISITE PERFUSION 16F (CANNULA) IMPLANT
CANNULA OPTISITE PERFUSION 18F (CANNULA) IMPLANT
CATH DIAG EXPO 6F VENT PIG 145 (CATHETERS) ×10 IMPLANT
CATH EXPO 5FR AL1 (CATHETERS) ×2 IMPLANT
CATH INFINITI 6F AL2 (CATHETERS) IMPLANT
CATH S G BIP PACING (SET/KITS/TRAYS/PACK) ×4 IMPLANT
CLIP VESOCCLUDE MED 24/CT (CLIP) IMPLANT
CLIP VESOCCLUDE SM WIDE 24/CT (CLIP) IMPLANT
CONT SPEC 4OZ CLIKSEAL STRL BL (MISCELLANEOUS) ×8 IMPLANT
COVER BACK TABLE 24X17X13 BIG (DRAPES) IMPLANT
COVER BACK TABLE 80X110 HD (DRAPES) ×8 IMPLANT
COVER DOME SNAP 22 D (MISCELLANEOUS) IMPLANT
CRADLE DONUT ADULT HEAD (MISCELLANEOUS) ×4 IMPLANT
DERMABOND ADVANCED (GAUZE/BANDAGES/DRESSINGS) ×1
DERMABOND ADVANCED .7 DNX12 (GAUZE/BANDAGES/DRESSINGS) ×3 IMPLANT
DEVICE CLOSURE PERCLS PRGLD 6F (VASCULAR PRODUCTS) ×6 IMPLANT
DRAPE INCISE IOBAN 66X45 STRL (DRAPES) IMPLANT
DRSG TEGADERM 4X4.75 (GAUZE/BANDAGES/DRESSINGS) ×8 IMPLANT
ELECT CAUTERY BLADE 6.4 (BLADE) IMPLANT
ELECT REM PT RETURN 9FT ADLT (ELECTROSURGICAL) ×8
ELECTRODE REM PT RTRN 9FT ADLT (ELECTROSURGICAL) ×6 IMPLANT
FELT TEFLON 6X6 (MISCELLANEOUS) IMPLANT
FEMORAL VENOUS CANN RAP (CANNULA) IMPLANT
GAUZE SPONGE 4X4 12PLY STRL (GAUZE/BANDAGES/DRESSINGS) ×4 IMPLANT
GAUZE SPONGE 4X4 12PLY STRL LF (GAUZE/BANDAGES/DRESSINGS) ×2 IMPLANT
GLOVE BIO SURGEON STRL SZ7.5 (GLOVE) ×4 IMPLANT
GLOVE BIO SURGEON STRL SZ8 (GLOVE) IMPLANT
GLOVE BIOGEL PI IND STRL 6.5 (GLOVE) ×1 IMPLANT
GLOVE BIOGEL PI INDICATOR 6.5 (GLOVE) ×1
GLOVE EUDERMIC 7 POWDERFREE (GLOVE) IMPLANT
GLOVE ORTHO TXT STRL SZ7.5 (GLOVE) IMPLANT
GOWN STRL REUS W/ TWL LRG LVL3 (GOWN DISPOSABLE) ×3 IMPLANT
GOWN STRL REUS W/ TWL XL LVL3 (GOWN DISPOSABLE) ×3 IMPLANT
GOWN STRL REUS W/TWL LRG LVL3 (GOWN DISPOSABLE) ×12
GOWN STRL REUS W/TWL XL LVL3 (GOWN DISPOSABLE) ×4
GUIDEWIRE SAFE TJ AMPLATZ EXST (WIRE) ×4 IMPLANT
GUIDEWIRE STRAIGHT .035 260CM (WIRE) ×4 IMPLANT
INSERT FOGARTY SM (MISCELLANEOUS) IMPLANT
KIT BASIN OR (CUSTOM PROCEDURE TRAY) ×4 IMPLANT
KIT DILATOR VASC 18G NDL (KITS) IMPLANT
KIT HEART LEFT (KITS) ×4 IMPLANT
KIT SUCTION CATH 14FR (SUCTIONS) IMPLANT
KIT TURNOVER KIT B (KITS) ×4 IMPLANT
LOOP VESSEL MAXI BLUE (MISCELLANEOUS) IMPLANT
LOOP VESSEL MINI RED (MISCELLANEOUS) IMPLANT
NDL PERC 18GX7CM (NEEDLE) ×2 IMPLANT
NEEDLE 22X1 1/2 (OR ONLY) (NEEDLE) ×2 IMPLANT
NEEDLE PERC 18GX7CM (NEEDLE) ×4 IMPLANT
NS IRRIG 1000ML POUR BTL (IV SOLUTION) ×12 IMPLANT
PACK ENDOVASCULAR (PACKS) ×4 IMPLANT
PAD ARMBOARD 7.5X6 YLW CONV (MISCELLANEOUS) ×8 IMPLANT
PAD ELECT DEFIB RADIOL ZOLL (MISCELLANEOUS) ×4 IMPLANT
PENCIL BUTTON HOLSTER BLD 10FT (ELECTRODE) ×4 IMPLANT
PERCLOSE PROGLIDE 6F (VASCULAR PRODUCTS) ×8
SET MICROPUNCTURE 5F STIFF (MISCELLANEOUS) ×4 IMPLANT
SHEATH BRITE TIP 6FR 35CM (SHEATH) ×4 IMPLANT
SHEATH PINNACLE 6F 10CM (SHEATH) ×4 IMPLANT
SHEATH PINNACLE 8F 10CM (SHEATH) ×4 IMPLANT
SLEEVE REPOSITIONING LENGTH 30 (MISCELLANEOUS) ×4 IMPLANT
SPONGE LAP 4X18 RFD (DISPOSABLE) IMPLANT
STOPCOCK MORSE 400PSI 3WAY (MISCELLANEOUS) ×8 IMPLANT
SUT ETHIBOND X763 2 0 SH 1 (SUTURE) IMPLANT
SUT GORETEX CV 4 TH 22 36 (SUTURE) IMPLANT
SUT GORETEX CV4 TH-18 (SUTURE) IMPLANT
SUT MNCRL AB 3-0 PS2 18 (SUTURE) IMPLANT
SUT PROLENE 5 0 C 1 36 (SUTURE) IMPLANT
SUT PROLENE 6 0 C 1 30 (SUTURE) IMPLANT
SUT SILK  1 MH (SUTURE) ×1
SUT SILK 1 MH (SUTURE) ×3 IMPLANT
SUT VIC AB 2-0 CT1 27 (SUTURE)
SUT VIC AB 2-0 CT1 TAPERPNT 27 (SUTURE) IMPLANT
SUT VIC AB 2-0 CTX 36 (SUTURE) IMPLANT
SUT VIC AB 3-0 SH 8-18 (SUTURE) IMPLANT
SYR 50ML LL SCALE MARK (SYRINGE) ×4 IMPLANT
SYR BULB IRRIGATION 50ML (SYRINGE) IMPLANT
SYR CONTROL 10ML LL (SYRINGE) ×2 IMPLANT
TAPE CLOTH SURG 4X10 WHT LF (GAUZE/BANDAGES/DRESSINGS) ×2 IMPLANT
TOWEL GREEN STERILE (TOWEL DISPOSABLE) ×8 IMPLANT
TRANSDUCER W/STOPCOCK (MISCELLANEOUS) ×8 IMPLANT
TRAY FOLEY SLVR 16FR TEMP STAT (SET/KITS/TRAYS/PACK) IMPLANT
VALVE HEART TRANSCATH SZ3 23MM (Prosthesis & Implant Heart) ×2 IMPLANT
WIRE .035 3MM-J 145CM (WIRE) ×4 IMPLANT

## 2018-05-08 NOTE — Anesthesia Procedure Notes (Signed)
Central Venous Catheter Insertion Performed by: Suzette Battiest, MD, anesthesiologist Start/End9/24/2019 7:12 AM, 05/08/2018 7:20 AM Patient location: Pre-op. Preanesthetic checklist: patient identified, IV checked, site marked, risks and benefits discussed, surgical consent, monitors and equipment checked, pre-op evaluation, timeout performed and anesthesia consent Position: Trendelenburg Lidocaine 1% used for infiltration and patient sedated Hand hygiene performed , maximum sterile barriers used  and Seldinger technique used Catheter size: 8 Fr Total catheter length 16. Central line was placed.Double lumen Procedure performed using ultrasound guided technique. Ultrasound Notes:anatomy identified, needle tip was noted to be adjacent to the nerve/plexus identified, no ultrasound evidence of intravascular and/or intraneural injection and image(s) printed for medical record Attempts: 1 Following insertion, dressing applied, line sutured and Biopatch. Post procedure assessment: blood return through all ports  Patient tolerated the procedure well with no immediate complications.

## 2018-05-08 NOTE — Progress Notes (Signed)
  Echocardiogram 2D Echocardiogram has been performed.  Merrie Roof F 05/08/2018, 9:54 AM

## 2018-05-08 NOTE — Progress Notes (Signed)
Patient arrived from Cath lab. To room 4e19 Patient with B groins level 0 at this time no hematoma noted. Patient placed on monitor and vital signs obtained. Will continue to monitor patient. Maat Kafer, Bettina Gavia RN

## 2018-05-08 NOTE — Progress Notes (Addendum)
  [place] VALVE TEAM  Patient doing well s/p TAVR. She is hemodynamically stable. She was initially hypertensive but now BP has normalized. I have ordered PRN hydralazine in case BP goes up tonight. Her creat was elevated to 1.7 this AM, so will continue to hold home ARB and holding home BB given possibility for conduction disturbance s/p TAVR. ECG with new LBBB but no high grade block. Groin sites stable.    She has been transferred to 4E. Early ambulation and hopeful discharge over the next 1-2 days.  Angelena Form PA-C  [facility]  Pager [phone]

## 2018-05-08 NOTE — Progress Notes (Signed)
Patient states Amlodipine causes swelling in ankles/legs. Declines to take at this time. [facility] made aware. Orders received will monitor patient. Cherrish Vitali, Bettina Gavia RN

## 2018-05-08 NOTE — Progress Notes (Signed)
Pt. Has a reddened, nickel area on her coccyx,  Small,Crusted, 1 cm area in the middle of the area.

## 2018-05-08 NOTE — Progress Notes (Signed)
Bed rest completed and patient to side of bed to eat some lunch. Patient also up to [facility] to use restroom. Back in bed. B groins level 0. No hematoma or bleeding noted at this time will monitor patient. Ailani Governale, Bettina Gavia RN

## 2018-05-08 NOTE — Progress Notes (Signed)
PHARMACY NOTE:  ANTIMICROBIAL RENAL DOSAGE ADJUSTMENT  Current antimicrobial regimen includes a mismatch between antimicrobial dosage and estimated renal function.  As per policy approved by the Pharmacy & Therapeutics and Medical Executive Committees, the antimicrobial dosage will be adjusted accordingly.  Current antimicrobial dosage: vancomycin 1000mg  IV x1x1  Indication: surgical prophylaxis  Renal Function:  Estimated Creatinine Clearance: 38.7 mL/min (A) (by C-G formula based on SCr of 1.7 mg/dL (H)).     Antimicrobial dosage has been changed to:  No further antibiotic coverage is needed  Additional comments:1500mg  IV vancomycin (given at ~ 7am) will cover for 24hrs   Thank you for allowing pharmacy to be a part of this patient's care.  Dareen Piano, [facility] 05/08/2018 11:48 AM

## 2018-05-08 NOTE — Transfer of Care (Signed)
Immediate Anesthesia Transfer of Care Note  Patient: [name]  Procedure(s) Performed: TRANSCATHETER AORTIC VALVE REPLACEMENT, TRANSFEMORAL (N/A Chest) INTRAOPERATIVE TRANSTHORACIC ECHOCARDIOGRAM  Patient Location: PACU and Cath Lab  Anesthesia Type:MAC  Level of Consciousness: awake and drowsy  Airway & Oxygen Therapy: Patient Spontanous Breathing and Patient connected to nasal cannula oxygen  Post-op Assessment: Report given to RN and Post -op Vital signs reviewed and stable  Post vital signs: Reviewed and stable  Last Vitals:  Vitals Value Taken Time  BP    Temp    Pulse    Resp    SpO2      Last Pain:  Vitals:   05/08/18 0623  TempSrc:   PainSc: 0-No pain         Complications: No apparent anesthesia complications

## 2018-05-08 NOTE — Anesthesia Procedure Notes (Signed)
Arterial Line Insertion Start/End9/24/2019 7:30 AM, 05/08/2018 7:35 AM Performed by: Suzette Battiest, MD  Patient location: Pre-op. Preanesthetic checklist: patient identified, IV checked, site marked, risks and benefits discussed, surgical consent, monitors and equipment checked, pre-op evaluation, timeout performed and anesthesia consent Lidocaine 1% used for infiltration Right, radial was placed Catheter size: 20 Fr Hand hygiene performed  and maximum sterile barriers used   Attempts: 1 Procedure performed without using ultrasound guided technique. Following insertion, dressing applied and Biopatch. Post procedure assessment: normal and unchanged  Patient tolerated the procedure well with no immediate complications.

## 2018-05-08 NOTE — Progress Notes (Signed)
Site area: rt groin fa and fv sheaths pulled and pressure held by Ellsworth Lennox, RN Site Prior to Removal:  Level 0 Pressure Applied For:  20 minutes Manual:   yes Patient Status During Pull:  Stable  Post Pull Site:  Level 0 Post Pull Instructions Given:  yes Post Pull Pulses Present: rt dp dopplered Dressing Applied:  Gauze and tegaderm Bedrest begins @  1025 Comments:

## 2018-05-08 NOTE — Op Note (Signed)
[facility]   MULTIDISCIPLINARY HEART VALVE TEAM   TAVR OPERATIVE NOTE   Date of Procedure:  05/08/2018  Preoperative Diagnosis: Severe Aortic Stenosis   Postoperative Diagnosis: Same   Procedure:    Transcatheter Aortic Valve Replacement - Percutaneous Left Transfemoral Approach  Edwards Sapien 3 THV (size 23 mm, model # 9600TFX, serial # [id number])   Co-Surgeons: Gaye Pollack, MD and Lauree Chandler, MD     Anesthesiologist:  Suzette Battiest, MD  Echocardiographer:  Jenkins Rouge, MD  Pre-operative Echo Findings:  Severe aortic stenosis  Normal left ventricular systolic function  Post-operative Echo Findings:  no paravalvular leak  Normal left ventricular systolic function   BRIEF CLINICAL NOTE AND INDICATIONS FOR SURGERY  The patient is a 67 year old woman with a history of hypertension, hyperlipidemia, type 2 diabetes, morbid obesity, stage III chronic kidney disease, OSA but does not use her CPAP mask, and nonobstructive coronary disease by catheterization in 2006 who presented to [facility] on 02/14/2018 with chest pain was found to have atrial fibrillation/flutter. She underwent a TEE directed DCCV and converted to sinus rhythm. Troponin was elevated at 7.1. Catheterization was deferred since she was fully anticoagulated prior to her cardioversion. Her TEE showed normal LV systolic function. The aortic valve was calcified with reduced leaflet mobility. The mean gradient was 32 mmHg with a peak gradient of 57 mmHg. Dimensionless index was 0.32 and aortic valve area was 0.92 cm. She has been maintained on Eliquis since her cardioversion on 02/16/2018. She subsequently underwent cardiac catheterization on 03/29/2018 by Dr. Angelena Form which showed moderate disease in the ostium of the RCA as well as in the mid RCA. There is moderate disease in the proximal left circumflex as well as the distal obtuse marginal branch. There is mild proximal LAD stenosis.  The LAD tapered to a small caliber vessel distally which was diffusely diseased with 99% stenosis. The mean gradient across aortic valve was measured at 29.6 mmHg with a peak to peak gradient of 34 mm. Aortic valve area was 1.11 cm.  She reports a several month history of progressive exertional shortness of breath and fatigue. This is improved since she has been started on Lasix. She also had some lower extremity swelling which improved on Lasix. She has had orthopnea and has been sleeping sitting up. She denies any chest pain or pressure. She has had no dizziness or syncope.     She has stage D, severe, symptomatic aortic stenosis with New York Heart Association class III symptoms of exertional fatigue and shortness of breath as well as some occasional chest discomfort. I have personally reviewed her echocardiogram, cardiac catheterization, and CTA studies. Her echo shows a trileaflet aortic valve with calcification of the leaflets and restricted motion. The mean gradient is 32 mmHg and the valve area is 0.7 to 0.92 cm. Left ventricular ejection fraction is 65 to 70%. Cardiac catheterization shows moderate nonobstructive coronary disease in the left circumflex and right coronary artery territories with 90% diffusely diseased distal LAD that is not suitable for revascularization. I agree that aortic valve replacement is the best treatment for this patient. Her STS risk of mortality for open surgical aortic valve replacement is only 2.6% but I think her operative risk will be higher due to her morbid obesity and severe restrictive lung disease with moderate reduction in her diffusion capacity. I think transcatheter aortic valve replacement would be the best option for her with less risk and a much quicker recovery. I have personally  reviewed her CT images. Her gated cardiac CTA shows anatomy that is suitable for transcatheter aortic valve replacement. She has a relatively small annulus that sizes to a 23 mm  Sapien valve. Her abdominal and pelvic CTA shows pelvic vascular anatomy that is suitable for transfemoral insertion.   The patient and her husband were counseled at length regarding treatment alternatives for management of severe symptomatic aortic stenosis. The risks and benefits of surgical intervention has been discussed in detail. Long-term prognosis with medical therapy was discussed. Alternative approaches such as conventional surgical aortic valve replacement, transcatheter aortic valve replacement, and palliative medical therapy were compared and contrasted at length. This discussion was placed in the context of the patient's own specific clinical presentation and past medical history. All of their questions have been addressed.   Following the decision to proceed with transcatheter aortic valve replacement, a discussion was held regarding what types of management strategies would be attempted intraoperatively in the event of life-threatening complications, including whether or not the patient would be considered a candidate for the use of cardiopulmonary bypass and/or conversion to open sternotomy for attempted surgical intervention.   The patient has been advised of a variety of complications that might develop including but not limited to risks of death, stroke, paravalvular leak, aortic dissection or other major vascular complications, aortic annulus rupture, device embolization, cardiac rupture or perforation, mitral regurgitation, acute myocardial infarction, arrhythmia, heart block or bradycardia requiring permanent pacemaker placement, congestive heart failure, respiratory failure, renal failure, pneumonia, infection, other late complications related to structural valve deterioration or migration, or other complications that might ultimately cause a temporary or permanent loss of functional independence or other long term morbidity. The patient provides full informed consent for the  procedure as described and all questions were answered.      DETAILS OF THE OPERATIVE PROCEDURE  PREPARATION:    The patient is brought to the operating room on the above mentioned date and central monitoring was established by the anesthesia team including placement of a central venous line and radial arterial line. The patient is placed in the supine position on the operating table.  Intravenous antibiotics are administered. The patient is monitored closely throughout the procedure under conscious sedation.    Baseline transthoracic echocardiogram was performed. The patient's chest, abdomen, both groins, and both lower extremities are prepared and draped in a sterile manner. A time out procedure is performed.   PERIPHERAL ACCESS:    Using the modified Seldinger technique, femoral arterial and venous access was obtained with placement of 6 Fr sheaths on the right side.  A pigtail diagnostic catheter was passed through the right arterial sheath under fluoroscopic guidance into the aortic root.  A temporary transvenous pacemaker catheter was passed through the right femoral venous sheath under fluoroscopic guidance into the right ventricle.  The pacemaker was tested to ensure stable lead placement and pacemaker capture. Aortic root angiography was performed in order to determine the optimal angiographic angle for valve deployment.   TRANSFEMORAL ACCESS:   Percutaneous transfemoral access and sheath placement was performed using ultrasound guidance.  The left common femoral artery was cannulated using a micropuncture needle and appropriate location was verified using hand injection angiogram.  A pair of Abbott Perclose percutaneous closure devices were placed and a 6 French sheath replaced into the femoral artery.  The patient was heparinized systemically and ACT verified > 250 seconds.    A 14 Fr transfemoral E-sheath was introduced into the left femoral artery after progressively  dilating  over an Amplatz superstiff wire. An AL-1 catheter was used to direct a straight-tip exchange length wire across the native aortic valve into the left ventricle. This was exchanged out for a pigtail catheter and position was confirmed in the LV apex. Simultaneous LV and Ao pressures were recorded.  The pigtail catheter was exchanged for an Amplatz Extra-stiff wire in the LV apex.     BALLOON AORTIC VALVULOPLASTY:   Not performed   TRANSCATHETER HEART VALVE DEPLOYMENT:   An Edwards Sapien 3 transcatheter heart valve (size 23 mm, model #9600TFX, serial #[id number]) was prepared and crimped per manufacturer's guidelines, and the proper orientation of the valve is confirmed on the [employer] delivery system. The valve was advanced through the introducer sheath using normal technique until in an appropriate position in the abdominal aorta beyond the sheath tip. The balloon was then retracted and using the fine-tuning wheel was centered on the valve. The valve was then advanced across the aortic arch using appropriate flexion of the catheter. The valve was carefully positioned across the aortic valve annulus. The Commander catheter was retracted using normal technique. Once final position of the valve has been confirmed by angiographic assessment, the valve is deployed while temporarily holding ventilation and during rapid ventricular pacing to maintain systolic blood pressure < 50 mmHg and pulse pressure < 10 mmHg. The balloon inflation is held for >3 seconds after reaching full deployment volume. Once the balloon has fully deflated the balloon is retracted into the ascending aorta and valve function is assessed using echocardiography. There is felt to be no paravalvular leak and no central aortic insufficiency.  The patient's hemodynamic recovery following valve deployment is good.  The deployment balloon and guidewire are both removed.    PROCEDURE COMPLETION:   The sheath was removed and femoral  artery closure performed using the previously placed Perclose devices.  Protamine was administered once femoral arterial repair was complete. The temporary pacemaker, pigtail catheters and femoral sheaths were removed with manual pressure used for hemostasis.   The patient tolerated the procedure well and is transported to the surgical intensive care in stable condition. There were no immediate intraoperative complications. All sponge instrument and needle counts are verified correct at completion of the operation.   No blood products were administered during the operation.  The patient received a total of 40.1 mL of intravenous contrast during the procedure.   Gaye Pollack, MD 05/08/2018 9:56 AM

## 2018-05-08 NOTE — CV Procedure (Signed)
[facility]  TAVR OPERATIVE NOTE   Date of Procedure:  05/08/2018  Preoperative Diagnosis: Severe Aortic Stenosis   Postoperative Diagnosis: Same   Procedure:    Transcatheter Aortic Valve Replacement - Transfemoral Approach  Edwards Sapien 3 THV (size 23 mm, model # [id number], serial # [id number])   Co-Surgeons:  Lauree Chandler, MD and Gaye Pollack, MD   Anesthesiologist:  Fitzgerald,Robert  Echocardiographer:  Johnsie Cancel  Pre-operative Echo Findings:  Severe aortic stenosis  Normal left ventricular systolic function  Post-operative Echo Findings:  No paravalvular leak  Normal left ventricular systolic function  BRIEF CLINICAL NOTE AND INDICATIONS FOR SURGERY  67 yo female with history of diabetes mellitus, stage 3 chronic kidney disease, CAD, hyperlipidemia, GERD, HTN, morbid obesity, sleep apnea, prior TIA, COPD and severe aortic valve stenosis who is here today for TAVR. She was admitted to [facility] 02/14/18 with c/o chest pain and was found to have atrial fibrillation/flutter. She underwent TEE guided DCCV and converted to sinus. Her troponin was found to be elevated, up to 7.1. Cath was deferred given the fact that she was fully anti-coagulated before and after her cardioversion. TEE showed normal LV systolic function. The aortic valve was calcified with limited leaflet mobility. Mean gradient 32 mmHg, peak gradient 57 mmHg, AVA 0.92 cm2, DVI 0.32. She has been on Eliquis following TEE guided DCCV on 02/16/18. She has undergone workup for TAVR including cardiac cath which showed moderate disease in the RCA and Circumflex with severe disease in the distal LAD. Medical management recommended for her CAD. She has described progressive fatigue, dyspnea and rare chest pains.   During the course of the patient's preoperative work up they have been evaluated comprehensively by a multidisciplinary team of specialists coordinated through the [facility] in the [facility].  They have been demonstrated to suffer from symptomatic severe aortic stenosis as noted above. The patient has been counseled extensively as to the relative risks and benefits of all options for the treatment of severe aortic stenosis including long term medical therapy, conventional surgery for aortic valve replacement, and transcatheter aortic valve replacement.  The patient has been independently evaluated by our cardiac surgeon Dr. Cyndia Bent, and they are felt to be at high risk for conventional surgical aortic valve replacement. Dr. Cyndia Bent indicated the patient would be a poor candidate for conventional surgery. Based upon review of all of the patient's preoperative diagnostic tests they are felt to be candidate for transcatheter aortic valve replacement using the transfemoral approach as an alternative to high risk conventional surgery.    Following the decision to proceed with transcatheter aortic valve replacement, a discussion has been held regarding what types of management strategies would be attempted intraoperatively in the event of life-threatening complications, including whether or not the patient would be considered a candidate for the use of cardiopulmonary bypass and/or conversion to open sternotomy for attempted surgical intervention.  The patient has been advised of a variety of complications that might develop peculiar to this approach including but not limited to risks of death, stroke, paravalvular leak, aortic dissection or other major vascular complications, aortic annulus rupture, device embolization, cardiac rupture or perforation, acute myocardial infarction, arrhythmia, heart block or bradycardia requiring permanent pacemaker placement, congestive heart failure, respiratory failure, renal failure, pneumonia, infection, other late complications related to structural valve deterioration or migration, or other complications that might  ultimately cause a temporary or permanent loss of functional independence or other long  term morbidity.  The patient provides full informed consent for the procedure as described and all questions were answered preoperatively.    DETAILS OF THE OPERATIVE PROCEDURE  PREPARATION:   The patient is brought to the operating room on the above mentioned date and central monitoring was established by the anesthesia team including placement of a radial arterial line. The patient is placed in the supine position on the operating table.  Intravenous antibiotics are administered. Conscious sedation is used.   Baseline transthoracic echocardiogram was performed. The patient's chest, abdomen, both groins, and both lower extremities are prepared and draped in a sterile manner. A time out procedure is performed.   PERIPHERAL ACCESS:   Using the modified Seldinger technique, femoral arterial and venous access were obtained with placement of 6 Fr sheaths on the right side.  A pigtail diagnostic catheter was passed through the femoral arterial sheath under fluoroscopic guidance into the aortic root.  A temporary transvenous pacemaker catheter was passed through the femoral venous sheath under fluoroscopic guidance into the right ventricle.  The pacemaker was tested to ensure stable lead placement and pacemaker capture. Aortic root angiography was performed in order to determine the optimal angiographic angle for valve deployment.  TRANSFEMORAL ACCESS:  A micropuncture kit was used to gain access to the left femoral artery. Position confirmed with angiography. Pre-closure with double ProGlide closure devices. The patient was heparinized systemically and ACT verified > 250 seconds.    A 14 Fr transfemoral E-sheath was introduced into the left femoral artery after progressively dilating over an Amplatz superstiff wire. An AL-1 catheter was used to direct a straight-tip exchange length wire across the native aortic valve  into the left ventricle. This was exchanged out for a pigtail catheter and position was confirmed in the LV apex. Simultaneous LV and Ao pressures were recorded.  The pigtail catheter was then exchanged for an Amplatz Extra-stiff wire in the LV apex.   TRANSCATHETER HEART VALVE DEPLOYMENT:  An Edwards Sapien 3 THV (size 23 mm) was prepared and crimped per manufacturer's guidelines, and the proper orientation of the valve is confirmed on the [employer] delivery system. The valve was advanced through the introducer sheath using normal technique until in an appropriate position in the abdominal aorta beyond the sheath tip. The balloon was then retracted and using the fine-tuning wheel was centered on the valve. The valve was then advanced across the aortic arch using appropriate flexion of the catheter. The valve was carefully positioned across the aortic valve annulus. The Commander catheter was retracted using normal technique. Once final position of the valve has been confirmed by angiographic assessment, the valve is deployed while temporarily holding ventilation and during rapid ventricular pacing to maintain systolic blood pressure < 50 mmHg and pulse pressure < 10 mmHg. The balloon inflation is held for >3 seconds after reaching full deployment volume. Once the balloon has fully deflated the balloon is retracted into the ascending aorta and valve function is assessed using TTE. There is felt to be no paravalvular leak and no central aortic insufficiency.  The patient's hemodynamic recovery following valve deployment is good.  The deployment balloon and guidewire are both removed. Echo demostrated acceptable post-procedural gradients, stable mitral valve function, and no AI.   PROCEDURE COMPLETION:  The sheath was then removed and closure devices were completed. Protamine was administered once femoral arterial repair was complete. The temporary pacemaker, pigtail catheters and femoral sheaths were  removed with manual pressure used for hemostasis.  The patient tolerated the procedure well and is transported to the surgical intensive care in stable condition. There were no immediate intraoperative complications. All sponge instrument and needle counts are verified correct at completion of the operation.   No blood products were administered during the operation.  The patient received a total of 40.1 mL of intravenous contrast during the procedure.  Lauree Chandler MD 05/08/2018 9:50 AM

## 2018-05-08 NOTE — Progress Notes (Signed)
  Echocardiogram 2D Echocardiogram has been performed.  Merrie Roof F 05/08/2018, 9:59 AM

## 2018-05-08 NOTE — Progress Notes (Signed)
Site area: rt radial arterial line Site Prior to Removal:  Level 0 Pressure Applied For: 10 minutes Manual:   yes Patient Status During Pull:  stable Post Pull Site:  Level 0 Post Pull Instructions Given:  yes Post Pull Pulses Present: rt radial palpable Dressing Applied:  Gauze and tegaderm Bedrest begins  Comments:

## 2018-05-08 NOTE — Anesthesia Postprocedure Evaluation (Signed)
Anesthesia Post Note  Patient: [name]  Procedure(s) Performed: TRANSCATHETER AORTIC VALVE REPLACEMENT, TRANSFEMORAL (N/A Chest) INTRAOPERATIVE TRANSTHORACIC ECHOCARDIOGRAM     Patient location during evaluation: PACU Anesthesia Type: MAC Level of consciousness: awake and alert Pain management: pain level controlled Vital Signs Assessment: post-procedure vital signs reviewed and stable Respiratory status: spontaneous breathing, nonlabored ventilation, respiratory function stable and patient connected to nasal cannula oxygen Cardiovascular status: stable and blood pressure returned to baseline Postop Assessment: no apparent nausea or vomiting Anesthetic complications: no    Last Vitals:  Vitals:   05/08/18 1104 05/08/18 1110  BP: 132/60 (!) 123/58  Pulse: 63 62  Resp: 18 (!) 21  Temp:    SpO2: 94% 98%    Last Pain:  Vitals:   05/08/18 1037  TempSrc: Temporal  PainSc:                  Tiajuana Amass

## 2018-05-08 NOTE — Interval H&P Note (Signed)
History and Physical Interval Note:  05/08/2018 7:22 AM  [name]  has presented today for surgery, with the diagnosis of Severe Aortic Stenosis  The various methods of treatment have been discussed with the patient and family. After consideration of risks, benefits and other options for treatment, the patient has consented to  Procedure(s): TRANSCATHETER AORTIC VALVE REPLACEMENT, TRANSFEMORAL (N/A) TRANSESOPHAGEAL ECHOCARDIOGRAM (TEE) (N/A) as a surgical intervention .  The patient's history has been reviewed, patient examined, no change in status, stable for surgery.  I have reviewed the patient's chart and labs.  Questions were answered to the patient's satisfaction.     Gaye Pollack

## 2018-05-09 ENCOUNTER — Encounter (HOSPITAL_COMMUNITY): Payer: Self-pay | Admitting: Cardiovascular Disease

## 2018-05-09 ENCOUNTER — Inpatient Hospital Stay (HOSPITAL_COMMUNITY): Payer: Medicare Other

## 2018-05-09 DIAGNOSIS — I5033 Acute on chronic diastolic (congestive) heart failure: Secondary | ICD-10-CM

## 2018-05-09 DIAGNOSIS — Z952 Presence of prosthetic heart valve: Secondary | ICD-10-CM

## 2018-05-09 DIAGNOSIS — I480 Paroxysmal atrial fibrillation: Secondary | ICD-10-CM

## 2018-05-09 DIAGNOSIS — I350 Nonrheumatic aortic (valve) stenosis: Principal | ICD-10-CM

## 2018-05-09 DIAGNOSIS — N183 Chronic kidney disease, stage 3 (moderate): Secondary | ICD-10-CM

## 2018-05-09 LAB — CBC
HCT: 39.8 % (ref 36.0–46.0)
Hemoglobin: 12.5 g/dL (ref 12.0–15.0)
MCH: 27.1 pg (ref 26.0–34.0)
MCHC: 31.4 g/dL (ref 30.0–36.0)
MCV: 86.3 fL (ref 78.0–100.0)
Platelets: 162 10*3/uL (ref 150–400)
RBC: 4.61 MIL/uL (ref 3.87–5.11)
RDW: 13.8 % (ref 11.5–15.5)
WBC: 10.1 10*3/uL (ref 4.0–10.5)

## 2018-05-09 LAB — ECHOCARDIOGRAM LIMITED
Height: 67.5 in
Weight: 3432.12 oz

## 2018-05-09 LAB — GLUCOSE, CAPILLARY
Glucose-Capillary: 235 mg/dL — ABNORMAL HIGH (ref 70–99)
Glucose-Capillary: 254 mg/dL — ABNORMAL HIGH (ref 70–99)
Glucose-Capillary: 302 mg/dL — ABNORMAL HIGH (ref 70–99)
Glucose-Capillary: 251 mg/dL — ABNORMAL HIGH (ref 70–99)
Glucose-Capillary: 162 mg/dL — ABNORMAL HIGH (ref 70–99)

## 2018-05-09 LAB — BASIC METABOLIC PANEL
Anion gap: 12 (ref 5–15)
BUN: 35 mg/dL — ABNORMAL HIGH (ref 8–23)
CO2: 25 mmol/L (ref 22–32)
Calcium: 8.3 mg/dL — ABNORMAL LOW (ref 8.9–10.3)
Chloride: 103 mmol/L (ref 98–111)
Creatinine, Ser: 1.64 mg/dL — ABNORMAL HIGH (ref 0.44–1.00)
GFR calc Af Amer: 36 mL/min — ABNORMAL LOW (ref >60)
GFR calc non Af Amer: 31 mL/min — ABNORMAL LOW (ref >60)
Glucose, Bld: 263 mg/dL — ABNORMAL HIGH (ref 70–99)
Potassium: 4.1 mmol/L (ref 3.5–5.1)
Sodium: 140 mmol/L (ref 135–145)

## 2018-05-09 LAB — MAGNESIUM: Magnesium: 1.5 mg/dL — ABNORMAL LOW (ref 1.7–2.4)

## 2018-05-09 MED ORDER — INSULIN ASPART PROT & ASPART (70-30 MIX) 100 UNIT/ML ~~LOC~~ SUSP
15.0000 [IU] | Freq: Every day | SUBCUTANEOUS | Status: DC
  Administered 2018-05-09: 15 [IU] via SUBCUTANEOUS
  Filled 2018-05-09: qty 10

## 2018-05-09 MED ORDER — FUROSEMIDE 10 MG/ML IJ SOLN
40.0000 mg | Freq: Once | INTRAMUSCULAR | Status: AC
  Administered 2018-05-09: 40 mg via INTRAVENOUS
  Filled 2018-05-09: qty 4

## 2018-05-09 MED ORDER — INSULIN ASPART PROT & ASPART (70-30 MIX) 100 UNIT/ML ~~LOC~~ SUSP
65.0000 [IU] | Freq: Two times a day (BID) | SUBCUTANEOUS | Status: DC
  Administered 2018-05-09 – 2018-05-10 (×3): 65 [IU] via SUBCUTANEOUS
  Filled 2018-05-09 (×2): qty 10

## 2018-05-09 MED ORDER — METOPROLOL TARTRATE 25 MG PO TABS
25.0000 mg | ORAL_TABLET | Freq: Two times a day (BID) | ORAL | Status: DC
  Administered 2018-05-09 – 2018-05-10 (×3): 25 mg via ORAL
  Filled 2018-05-09 (×3): qty 1

## 2018-05-09 MED FILL — Potassium Chloride Inj 2 mEq/ML: INTRAVENOUS | Qty: 40 | Status: AC

## 2018-05-09 MED FILL — Magnesium Sulfate Inj 50%: INTRAMUSCULAR | Qty: 10 | Status: AC

## 2018-05-09 MED FILL — Phenylephrine HCl IV Soln 10 MG/ML: INTRAVENOUS | Qty: 2 | Status: AC

## 2018-05-09 MED FILL — Heparin Sodium (Porcine) Inj 1000 Unit/ML: INTRAMUSCULAR | Qty: 30 | Status: AC

## 2018-05-09 MED FILL — Sodium Chloride IV Soln 0.9%: INTRAVENOUS | Qty: 250 | Status: AC

## 2018-05-09 NOTE — Progress Notes (Signed)
Per patient report. Patient awoke from sleeping and rolled over and became nauseated with vomiting. Small amount of emesis in cup. Patient given zofran as ordered as needed for nausea/vomiting will monitor patient. Dodd Schmid, Bettina Gavia RN

## 2018-05-09 NOTE — Plan of Care (Signed)
  Problem: Education: Goal: Knowledge of General Education information will improve Description Including pain rating scale, medication(s)/side effects and non-pharmacologic comfort measures Outcome: Progressing   Problem: Health Behavior/Discharge Planning: Goal: Ability to manage health-related needs will improve Outcome: Progressing   Problem: Clinical Measurements: Goal: Ability to maintain clinical measurements within normal limits will improve Outcome: Progressing Goal: Will remain free from infection Outcome: Progressing Goal: Diagnostic test results will improve Outcome: Progressing Goal: Cardiovascular complication will be avoided Outcome: Progressing   Problem: Elimination: Goal: Will not experience complications related to bowel motility Outcome: Progressing Goal: Will not experience complications related to urinary retention Outcome: Progressing   Problem: Pain Managment: Goal: General experience of comfort will improve Outcome: Progressing   Problem: Safety: Goal: Ability to remain free from injury will improve Outcome: Progressing   Problem: Skin Integrity: Goal: Risk for impaired skin integrity will decrease Outcome: Progressing   Problem: Education: Goal: Knowledge of disease or condition will improve Outcome: Progressing Goal: Knowledge of the prescribed therapeutic regimen will improve Outcome: Progressing   Problem: Activity: Goal: Risk for activity intolerance will decrease Outcome: Progressing   Problem: Cardiac: Goal: Will achieve and/or maintain hemodynamic stability Outcome: Progressing   Problem: Clinical Measurements: Goal: Postoperative complications will be avoided or minimized Outcome: Progressing   Problem: Respiratory: Goal: Respiratory status will improve Outcome: Progressing   Problem: Skin Integrity: Goal: Wound healing without signs and symptoms of infection Outcome: Progressing Goal: Risk for impaired skin integrity  will decrease Outcome: Progressing   Problem: Urinary Elimination: Goal: Ability to achieve and maintain adequate renal perfusion and functioning will improve Outcome: Progressing

## 2018-05-09 NOTE — Progress Notes (Signed)
CARDIAC REHAB PHASE I   PRE:  Rate/Rhythm: 85 SR  BP:  Sitting: 154/111      SaO2: 94 2 L  MODE:  Ambulation: 200 ft   POST:  Rate/Rhythm: 86 SR  BP:  Sitting: 161/69    SaO2: 93 RA   Pt ambulated 250ft standby assist with IV pole. Pt denies CP or SOB. Pt thinks she feels a little better than pre-op. Encouraged pt to walk again later today. Will continue to monitor.  [id number] Rufina Falco, RN BSN 05/09/2018 10:54 AM

## 2018-05-09 NOTE — Progress Notes (Signed)
  Echocardiogram 2D Echocardiogram has been performed.  [name] 05/09/2018, 11:45 AM

## 2018-05-09 NOTE — Progress Notes (Signed)
Progress Note  Patient Name: [name] Date of Encounter: 05/09/2018  Primary Cardiologist: Minus Breeding, MD   Subjective   Mild chest pressure overnight. This felt like her heartburn. She walked and it felt better. Sinus tachycardia overnight   Inpatient Medications    Scheduled Meds: . amiodarone  200 mg Oral Daily  . aspirin  81 mg Oral Daily  . Influenza vac split quadrivalent PF  0.5 mL Intramuscular Tomorrow-1000  . insulin aspart  0-24 Units Subcutaneous TID AC & HS  . sodium chloride flush  3 mL Intravenous Q12H   Continuous Infusions: . sodium chloride    . levofloxacin (LEVAQUIN) IV    . nitroGLYCERIN     PRN Meds: sodium chloride, acetaminophen **OR** acetaminophen, albuterol, hydrALAZINE, metoprolol tartrate, morphine injection, ondansetron (ZOFRAN) IV, oxyCODONE, sodium chloride flush, traMADol   Vital Signs    Vitals:   05/09/18 0129 05/09/18 0135 05/09/18 0435 05/09/18 0535  BP:   (!) 145/62   Pulse: 96 95 (!) 106 98  Resp: (!) 26 (!) 24 (!) 28 (!) 25  Temp:   97.9 F (36.6 C)   TempSrc:   Oral   SpO2: (!) 88% 92% 92% 92%  Weight:   97.3 kg   Height:        Intake/Output Summary (Last 24 hours) at 05/09/2018 0740 Last data filed at 05/09/2018 0500 Gross per 24 hour  Intake 1683.19 ml  Output 5 ml  Net 1678.19 ml   Filed Weights   05/08/18 1956 05/09/18 0435  Weight: 96.7 kg 97.3 kg    Telemetry    Sinus tachycardia - Personally Reviewed  ECG    Sinus tachy, LBBB - Personally Reviewed  Physical Exam   GEN: No acute distress.   Neck: No JVD Cardiac: Regular tachycardic. Soft systolic murmur.   Respiratory: Clear to auscultation bilaterally. GI: Soft, nontender, non-distended  Ext: No LE edema; bilateral groins without hematoma.  Neuro:  Nonfocal  Psych: Normal affect   Labs    Chemistry Recent Labs  Lab 05/04/18 0934  05/08/18 0907 05/08/18 1007 05/09/18 0448  NA 141   < > 142 142 140  K 4.2   < > 3.9 4.3 4.1    CL 102   < > 104 104 103  CO2 26  --   --   --  25  GLUCOSE 232*   < > 215* 251* 263*  BUN 36*   < > 38* 38* 35*  CREATININE 1.45*   < > 1.60* 1.70* 1.64*  CALCIUM 9.0  --   --   --  8.3*  PROT 6.3*  --   --   --   --   ALBUMIN 3.5  --   --   --   --   AST 16  --   --   --   --   ALT 16  --   --   --   --   ALKPHOS 55  --   --   --   --   BILITOT 1.1  --   --   --   --   GFRNONAA 36*  --   --   --  31*  GFRAA 42*  --   --   --  36*  ANIONGAP 13  --   --   --  12   < > = values in this interval not displayed.     Hematology Recent Labs  Lab 05/04/18 [id number]  05/08/18 0907 05/08/18 1007 05/09/18 0448  WBC 8.6  --   --   --  10.1  RBC 4.95  --   --   --  4.61  HGB 13.4   < > 11.9* 11.9* 12.5  HCT 42.9   < > 35.0* 35.0* 39.8  MCV 86.7  --   --   --  86.3  MCH 27.1  --   --   --  27.1  MCHC 31.2  --   --   --  31.4  RDW 13.9  --   --   --  13.8  PLT 258  --   --   --  162   < > = values in this interval not displayed.    Cardiac EnzymesNo results for input(s): TROPONINI in the last 168 hours. No results for input(s): TROPIPOC in the last 168 hours.   BNP Recent Labs  Lab 05/04/18 0934  BNP 141.8*     DDimer No results for input(s): DDIMER in the last 168 hours.   Radiology    Dg Chest Port 1 View  Result Date: 05/08/2018 CLINICAL DATA:  Aortic valve replacement. EXAM: PORTABLE CHEST 1 VIEW COMPARISON:  05/04/2018.  02/14/2018.  01/08/2014. FINDINGS: Right IJ line noted with tip over superior vena cava. Prior cardiac valve replacement. Cardiomegaly with normal pulmonary vascularity. Mild bibasilar atelectasis and/or scarring. IMPRESSION: 1.  Right IJ line noted with tip over superior vena cava. 2.  Cardiomegaly.  No pulmonary venous congestion. 3.  Mild bibasilar atelectasis and/or scarring. Electronically Signed   By: Marcello Moores  Register   On: 05/08/2018 12:24    Cardiac Studies     Patient Profile     67 y.o. female with chronic diastolic CHF, DM, severe AS s/p  TAVR, HTN, atrial fibrillation, CKD, morbid obesity who underwent TAVR on 05/08/18.   Assessment & Plan    1. Severe aortic stenosis s/p TAVR: she is now one day post TAVR with placement of a 23 mm Edwards Sapien 3 valve from the left transfemoral approach. She is doing well this am. New LBBB post TAVR. Given sinus tachycardia, will resume Lopressor 25 mg po BID and follow on telemetry for signs of heart block.  Continue ASA today. Will resume Eliquis tomorrow. Will plan echo today to assess the valve.   2. Acute on Chronic diastolic CHF: Mild volume overload post TAVR. Will give one dose of IV Lasix today and follow creatinine closely. ARB is on hold for mild worsening of baseline renal insufficiency   3. Atrial fibrillation: She is in sinus this am with sinus tach. Continue amiodarone. Resume Lopressor. Will restart Eliquis tomorrow.   4. Chronic kidney disease, stage 3: Will continue to hold ARB. Repeat creatinine in the am.   5. Diabetes mellitus: Resume home insulin therapy today. Follow with SSI. Metformin on hold post contrast loading with TAVR  For questions or updates, please contact [place] Please consult [email or web address] for contact info under        Signed, Lauree Chandler, MD  05/09/2018, 7:40 AM

## 2018-05-09 NOTE — Progress Notes (Signed)
@  approx. 0100 pt called RN to room endorsing "indigestion". Pt assessed and endorsed need to belch and sit-up. (Lung and heart sounds assessed and WDL- HR markedly increased though from prior assessment 60s-->90s-100s.) At pt's request, pt ambulated down hall (without complication). Pt returned to room, and Zofran administered for indigestion/nausea, along with APAP for generalized pain.  @0129  pt SpO2 noted to be 88% with good pleth on nursing station monitor. Pt assessed and appeared to be sound asleep and breathing shallow. O2 via [place] increased from 1 to 2L. Will continue to monitor.

## 2018-05-09 NOTE — Progress Notes (Signed)
@  6770 vital check, pt endorsed transient orthopnea with faint chest discomfort she again described as indigestion. Pt repositioned with complete resolution of pain but still endorses transient orthopnea.

## 2018-05-10 DIAGNOSIS — E1122 Type 2 diabetes mellitus with diabetic chronic kidney disease: Secondary | ICD-10-CM

## 2018-05-10 DIAGNOSIS — I5033 Acute on chronic diastolic (congestive) heart failure: Secondary | ICD-10-CM

## 2018-05-10 LAB — CBC
HCT: 35.5 % — ABNORMAL LOW (ref 36.0–46.0)
Hemoglobin: 11.3 g/dL — ABNORMAL LOW (ref 12.0–15.0)
MCH: 27.3 pg (ref 26.0–34.0)
MCHC: 31.8 g/dL (ref 30.0–36.0)
MCV: 85.7 fL (ref 78.0–100.0)
Platelets: 140 10*3/uL — ABNORMAL LOW (ref 150–400)
RBC: 4.14 MIL/uL (ref 3.87–5.11)
RDW: 14.2 % (ref 11.5–15.5)
WBC: 8.7 10*3/uL (ref 4.0–10.5)

## 2018-05-10 LAB — BASIC METABOLIC PANEL
Anion gap: 6 (ref 5–15)
BUN: 34 mg/dL — ABNORMAL HIGH (ref 8–23)
CO2: 28 mmol/L (ref 22–32)
Calcium: 8 mg/dL — ABNORMAL LOW (ref 8.9–10.3)
Chloride: 105 mmol/L (ref 98–111)
Creatinine, Ser: 1.7 mg/dL — ABNORMAL HIGH (ref 0.44–1.00)
GFR calc Af Amer: 35 mL/min — ABNORMAL LOW (ref >60)
GFR calc non Af Amer: 30 mL/min — ABNORMAL LOW (ref >60)
Glucose, Bld: 143 mg/dL — ABNORMAL HIGH (ref 70–99)
Potassium: 3.8 mmol/L (ref 3.5–5.1)
Sodium: 139 mmol/L (ref 135–145)

## 2018-05-10 LAB — GLUCOSE, CAPILLARY
Glucose-Capillary: 111 mg/dL — ABNORMAL HIGH (ref 70–99)
Glucose-Capillary: 259 mg/dL — ABNORMAL HIGH (ref 70–99)

## 2018-05-10 MED ORDER — ASPIRIN 81 MG PO CHEW: 81.0000 mg | CHEWABLE_TABLET | Freq: Every day | ORAL | Status: DC

## 2018-05-10 MED ORDER — FUROSEMIDE 20 MG PO TABS: 20.0000 mg | ORAL_TABLET | Freq: Two times a day (BID) | ORAL | Status: DC

## 2018-05-10 NOTE — Progress Notes (Signed)
[phone] Pt stated she walked earlier in hall so did not walk with pt. Discussed walking for ex and gave guidelines. Gave diabetic and heart healthy diets. Discussed CRP 2 and will refer to [facility]. Graylon Good RN BSN 05/10/2018 9:43 AM

## 2018-05-10 NOTE — Discharge Summary (Addendum)
[place] VALVE TEAM  Discharge Summary    Patient ID: [name] MRN: [id number]; DOB: 1951/05/19  Admit date: 05/08/2018 Discharge date: 05/10/2018  Primary Care Provider: Chipper Herb, MD  Primary Cardiologist: Minus Breeding, MD / Dr. Angelena Form & Dr. 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 Atorvastatin Other (See Comments)    Myalgias   . Crestor [Rosuvastatin Calcium] Other (See Comments)    Stiffness and back pain  . Ezetimibe-Simvastatin Other (See Comments)    Leg cramps  . Celebrex [Celecoxib] Rash  . Levemir [Insulin Detemir] Rash  . Penicillins Other (See Comments)    SYNCOPE (Tolerated Ancef) Has patient had a PCN reaction causing immediate rash, facial/tongue/throat swelling, SOB or lightheadedness with hypotension: Yes Has patient had a PCN reaction causing severe rash involving mucus membranes or skin necrosis: No Has patient had a PCN reaction that required hospitalization No Has patient had a PCN reaction occurring within the last 10 years: No If all of the above answers are "NO", then may proceed with Cephalosporin use.    Diagnostic Studies/Procedures    TAVR  OPERATIVE NOTE   Date of Procedure:                05/08/2018  Preoperative Diagnosis:      Severe Aortic Stenosis  Procedure:        Transcatheter Aortic Valve Replacement - Transfemoral Approach             Edwards Sapien 3 THV (size 23 mm, model # F048547, serial # [id number])              Co-Surgeons:                        Lauree Chandler, MD and Gaye Pollack, MD   Pre-operative Echo Findings: ? Severe aortic stenosis ? Normal left ventricular systolic function  Post-operative Echo Findings: ? No paravalvular leak ? Normal left ventricular systolic function  _____________    Post operative echo 05/09/18 Study Conclusions - Left ventricle: The cavity size was normal. There was severe   concentric hypertrophy. Systolic function was normal. The   estimated ejection fraction was in the range of 60% to 65%. The   study is not technically sufficient to allow evaluation of LV   diastolic function. - Aortic valve: s/p TAVR - Edwards Sapien 3 valve (23 mm). No   obstruction with trivial paravalvular leak. Mean gradient (S): 8   mm Hg. Peak gradient (S): 21 mm Hg. Valve area (VTI): 3.07 cm^2.   Valve area (Vmax): 2.55 cm^2. Valve area (Vmean): 3.25 cm^2. - Mitral valve: Calcified annulus. Valve area by continuity  equation (using LVOT flow): 2.38 cm^2. - Systemic veins: IVC measures <2.1 cm, but does not collapse >50%,   suggesting an elevated RA pressure of 8 mmHg. - Pericardium, extracardiac: A moderate sized pericardial effusion   was identified posterior to the heart. Features were not   consistent with tamponade physiology. Impressions: - LVEF 60-65%, stable echo post TAVR - no obstruction and trivial   paravalvular leak. Moderate sized pericardial effusion is noted,   mostly posterior.   History of Present Illness     [name] is a 67 y.o. female with a history of [name], [name], [name], [name], [name], [name], [name], Alison  atrial fibrillation flutter s/p DCCV maintaining sinus rhythm on amiodarone and Eliquis and severe AS who presented to [facility] on 05/08/2018 for planned TAVR.   She was admitted to [facility] in 02/2018 with chest pain and found to have atrial fibrillation/flutter. She underwent a TEE directed DCCV and converted to sinus rhythm. Troponin was elevated at 7.1. Catheterization was deferred since she was fully anticoagulated prior to her cardioversion. Her TEE showed normal LV systolic function. The aortic valve was calcified with reduced leaflet mobility. The mean gradient was 32 mmHg with a peak gradient of 57 mmHg. Dimensionless index was 0.32 and aortic valve area was 0.92 cm. She has been maintained on Eliquis since her cardioversion on 02/16/2018. She subsequently underwent cardiac catheterization on 03/29/2018 by Dr. Angelena Form which showed moderate disease in the ostium of the RCA as well as in the mid RCA. There is moderate disease in the proximal left circumflex as well as the distal obtuse marginal branch. There is mild proximal LAD stenosis. The LAD tapered to a small caliber vessel distally which was diffusely diseased with 99% stenosis. The mean gradient across aortic valve was measured at 29.6 mmHg with a peak to peak gradient of 34 mm. Aortic valve area was 1.11 cm.  She reported several month history of progression of exertional shortness of breath and fatigue as well as orthopnea and lower extremity edema.  She was found to have stage D severe, symptomatic aortic stenosis with NYHA class [name] symptoms.  She was evaluated by the multidisciplinary valve team and felt to be a suitable candidate for TAVR, which was set up for 05/08/2018.    Hospital Course     Consultants: none  Severe aortic stenosis s/p TAVR:  She is s/p successful placement of a 23 mm Edwards Sapien 3 valve from the left transfemoral approach on 05/08/2018. She is doing well. Both groins are soft with no hematoma. New LBBB post TAVR. She  is back on Lopressor and no evidence of high grade AV block. Echo 05/09/18 with normal LV systolic function. The bioprosthetic valve is working well with trivial PVL. Mean gradient 8 mmHg. Will continue ASA and restart Eliquis today.    Acute on Chronic diastolic CHF:  As evidenced by an elevated BNP on preadmission testing.  This is been treated with TAVR.  Volume status is ok today. Resume home Lasix 20 mg twice daily.    [name]: BP mildly elevated.  Creatinine is stable and just above her baseline.  Will not restart Losartan until she is seen back in the office in 2 weeks.   Atrial fibrillation:  She has been maintaining sinus rhythm during this admission.  Continue amiodarone, Lopressor. Will restart Eliquis.    Chronic kidney disease, stage 3:  Creatinine slightly above baseline at 1.7.  Will hold ARB until  follow up visit and resume with creatinine back to baseline.  Diabetes mellitus: She is back on her home insulin regimen. Resume metformin.   Moderate pericardial effusion: This is posterior and was present before her TAVR. She will be getting a follow up echo in one month as part of the TAVR protocol.   _____________  Discharge Vitals Blood pressure (!) 147/61, pulse 77, temperature 98.1 F (36.7 C), temperature source Oral, resp. rate (!) 23, height 5' 7.5" (1.715 m), weight 97.9 kg, SpO2 92 %.  Filed Weights   05/08/18 1956 05/09/18 0435 05/10/18 0124  Weight: 96.7 kg 97.3 kg 97.9 kg    Labs & Radiologic Studies    CBC Recent Labs    05/09/18 0448 05/10/18 0330  WBC 10.1 8.7  HGB 12.5 11.3*  HCT 39.8 35.5*  MCV 86.3 85.7  PLT 162 702*   Basic Metabolic Panel Recent Labs    05/09/18 0448 05/10/18 0330  NA 140 139  K 4.1 3.8  CL 103 105  CO2 25 28  GLUCOSE 263* 143*  BUN 35* 34*  CREATININE 1.64* 1.70*  CALCIUM 8.3* 8.0*  MG 1.5*  --    Liver Function Tests No results for input(s): AST, ALT, ALKPHOS, BILITOT, PROT, ALBUMIN in the last 72 hours. No  results for input(s): LIPASE, AMYLASE in the last 72 hours. Cardiac Enzymes No results for input(s): CKTOTAL, CKMB, CKMBINDEX, TROPONINI in the last 72 hours. BNP Invalid input(s): POCBNP D-Dimer No results for input(s): DDIMER in the last 72 hours. Hemoglobin A1C No results for input(s): HGBA1C in the last 72 hours. Fasting Lipid Panel No results for input(s): CHOL, HDL, LDLCALC, TRIG, CHOLHDL, LDLDIRECT in the last 72 hours. Thyroid Function Tests No results for input(s): TSH, T4TOTAL, T3FREE, THYROIDAB in the last 72 hours.  Invalid input(s): FREET3 _____________  Dg Chest 2 View  Result Date: 05/04/2018 CLINICAL DATA:  Preoperative evaluation for aortic valve replacement EXAM: CHEST - 2 VIEW COMPARISON:  Chest radiograph February 18, 2018; chest CT April 19, 2018 FINDINGS: There is no appreciable edema or consolidation. There is a minimal left pleural effusion. There is cardiomegaly with pulmonary vascularity normal. No adenopathy. There is aortic atherosclerosis. There is degenerative change in the thoracic spine. IMPRESSION: No edema or consolidation.  Minimal left pleural effusion. There is cardiomegaly with pulmonary vascularity normal. There is aortic atherosclerosis. Aortic Atherosclerosis (ICD10-I70.0). Electronically Signed   By: Lowella Grip [name] M.D.   On: 05/04/2018 14:11   Ct Coronary Morph W/cta Cor W/score W/ca W/cm &/or Wo/cm  Addendum Date: 04/19/2018   ADDENDUM REPORT: 04/19/2018 15:18 EXAM: OVER-READ INTERPRETATION  CT CHEST The following report is an over-read performed by radiologist Dr. Rebekah Chesterfield [facility] on 04/19/2018. This over-read does not include interpretation of cardiac or coronary anatomy or pathology. The cardiac CTA interpretation by the cardiologist is attached. COMPARISON:  None. FINDINGS: Extracardiac findings will be described separately under dictation for contemporaneously obtained CTA chest, abdomen and pelvis. IMPRESSION: Please  see separate dictation for contemporaneously obtained CTA chest, abdomen and pelvis dated 04/19/2018 for full description of relevant extracardiac findings. Electronically Signed   By: Vinnie Langton M.D.   On: 04/19/2018 15:18   Result Date: 04/19/2018 CLINICAL DATA:  67 year old female with severe aortic stenosis being evaluated for a TAVR procedure. EXAM: Cardiac TAVR CT TECHNIQUE: The patient was scanned on a [employer]. A 120 kV retrospective scan was triggered in the descending thoracic aorta at 111 HU's. Gantry rotation speed was  250 msecs and collimation was .6 mm. No beta blockade or nitro were given. The 3D data set was reconstructed in 5% intervals of the R-R cycle. Systolic and diastolic phases were analyzed on a dedicated work station using MPR, MIP and VRT modes. The patient received 80 cc of contrast. FINDINGS: Aortic Valve: Trileaflet, severely calcified and thickened aortic valve with severely decreased leaflet opening. There are minimal calcifications extending into the LVOT. Aorta: Normal size, mild diffuse calcifications.  No dissection. Sinotubular Junction: 29 x 28 mm Ascending Thoracic Aorta: 31 x 30 mm Aortic Arch: 24 x 23 mm Descending Thoracic Aorta: 21 x 21 mm Sinus of Valsalva Measurements: Non-coronary: 29 mm Right -coronary: 29 mm Left -coronary: 30 mm Coronary Artery Height above Annulus: Left Main: 15 mm Right Coronary: 17 mm Virtual Basal Annulus Measurements: Maximum/Minimum Diameter: 24.9 x 21.5 mm Mean Diameter: 22.8 mm Perimeter: 72.9 mm Area: 407 mm2 Optimum Fluoroscopic Angle for Delivery: RAO 0 CRA 0 IMPRESSION: 1. Trileaflet, severely calcified and thickened aortic valve with severely decreased leaflet opening. There are minimal calcifications extending into the LVOT. Annular measurements are suitable for delivery of a 23 mm Edwards-SAPIEN 3 valve or 29 mm Medtronic CoreValve Evolut R. 2. Sufficient coronary to annulus distance. 3. Optimum Fluoroscopic Angle  for Delivery: RAO 0 CRA 0 4. No thrombus in the left atrial appendage. Electronically Signed: By: Ena Dawley On: 04/19/2018 13:04   Dg Chest Port 1 View  Result Date: 05/08/2018 CLINICAL DATA:  Aortic valve replacement. EXAM: PORTABLE CHEST 1 VIEW COMPARISON:  05/04/2018.  02/14/2018.  01/08/2014. FINDINGS: Right IJ line noted with tip over superior vena cava. Prior cardiac valve replacement. Cardiomegaly with normal pulmonary vascularity. Mild bibasilar atelectasis and/or scarring. IMPRESSION: 1.  Right IJ line noted with tip over superior vena cava. 2.  Cardiomegaly.  No pulmonary venous congestion. 3.  Mild bibasilar atelectasis and/or scarring. Electronically Signed   By: Marcello Moores  Register   On: 05/08/2018 12:24   Ct Angio Chest Aorta W &/or Wo Contrast  Result Date: 04/19/2018 CLINICAL DATA:  67 year old female with history of severe aortic stenosis. Preprocedural study prior to potential transcatheter aortic valve replacement (TAVR) procedure. EXAM: CT ANGIOGRAPHY CHEST, ABDOMEN AND PELVIS TECHNIQUE: Multidetector CT imaging through the chest, abdomen and pelvis was performed using the standard protocol during bolus administration of intravenous contrast. Multiplanar reconstructed images and MIPs were obtained and reviewed to evaluate the vascular anatomy. CONTRAST:  145mL ISOVUE-370 IOPAMIDOL (ISOVUE-370) INJECTION 76% COMPARISON:  Chest CT 05/07/2011. CT the abdomen and pelvis 01/03/2017. FINDINGS: CTA CHEST FINDINGS Cardiovascular: Heart size is mildly enlarged with left ventricular concentric hypertrophy. Small amount of pericardial fluid and/or thickening. No pericardial calcification. There is aortic atherosclerosis, as well as atherosclerosis of the great vessels of the mediastinum and the coronary arteries, including calcified atherosclerotic plaque in the left main, left anterior descending, left circumflex and right coronary arteries. Severe thickening calcification of the aortic valve.  Severe calcifications of the mitral annulus. Mediastinum/Lymph Nodes: No pathologically enlarged mediastinal or hilar lymph nodes. Esophagus is unremarkable in appearance. No axillary lymphadenopathy. Lungs/Pleura: Small bilateral pleural effusions. Mild ground-glass attenuation and interlobular septal thickening in the lungs bilaterally, most evident throughout the mid to lower lungs, concerning for a background of mild interstitial pulmonary edema. No confluent consolidative airspace disease. No suspicious appearing pulmonary nodules or masses are noted. Musculoskeletal/Soft Tissues: There are no aggressive appearing lytic or blastic lesions noted in the visualized portions of the skeleton. CTA ABDOMEN AND PELVIS FINDINGS Hepatobiliary:  No suspicious cystic or solid hepatic lesions. No intra or extrahepatic biliary ductal dilatation. Status post cholecystectomy. Pancreas: No pancreatic mass. No pancreatic ductal dilatation. No pancreatic or peripancreatic fluid or inflammatory changes. Spleen: Small splenule adjacent to the spleen. Otherwise, unremarkable. Adrenals/Urinary Tract: Bilateral kidneys and bilateral adrenal glands are normal in appearance. No hydroureteronephrosis. Urinary bladder is normal in appearance. 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 No pathologic dilatation of small bowel or colon. Normal appendix. Vascular/Lymphatic: Vascular findings and measurements pertinent to potential TAVR procedure, as detailed below. Aortic atherosclerosis, without evidence of aneurysm or dissection in the abdominal or pelvic vasculature. No lymphadenopathy noted in the abdomen or pelvis. Reproductive: Uterus and ovaries are unremarkable in appearance. Other: No significant volume of ascites.  No pneumoperitoneum. Musculoskeletal: There are no aggressive appearing lytic or blastic lesions noted in the visualized portions of the skeleton. VASCULAR MEASUREMENTS  PERTINENT TO TAVR: AORTA: Minimal Aortic Diameter-12 x 13 mm Severity of Aortic Calcification-severe RIGHT PELVIS: Right Common Iliac Artery - Minimal Diameter-9.0 x 5.4 mm Tortuosity-mild Calcification-moderate Right External Iliac Artery - Minimal Diameter-6.4 x 6.5 mm Tortuosity - mild Calcification-none Right Common Femoral Artery - Minimal Diameter-8.2 x 8.2 mm Tortuosity - mild Calcification-none LEFT PELVIS: Left Common Iliac Artery - Minimal Diameter-7.5 x 6.0 mm Tortuosity - mild Calcification-moderate Left External Iliac Artery - Minimal Diameter-7.9 x 6.5 mm Tortuosity - mild Calcification-none Left Common Femoral Artery - Minimal Diameter-7.5 x 7.2 mm Tortuosity - mild Calcification-mild Review of the MIP images confirms the above findings. IMPRESSION: 1. Vascular findings and measurements pertinent to potential TAVR procedure, as detailed above. 2. Severe thickening calcification of the aortic valve, compatible with reported clinical history of severe aortic stenosis. 3. Small amount of pericardial fluid and/or thickening. No pericardial calcification. 4. Cardiomegaly with left ventricular hypertrophy. 5. Aortic atherosclerosis, in addition to left main and 3 vessel coronary artery disease. 6. Additional incidental findings, as above. Aortic Atherosclerosis (ICD10-I70.0). Electronically Signed   By: Vinnie Langton M.D.   On: 04/19/2018 16:03   Ct Angio Abdomen Pelvis  W &/or Wo Contrast  Result Date: 04/19/2018 CLINICAL DATA:  67 year old female with history of severe aortic stenosis. Preprocedural study prior to potential transcatheter aortic valve replacement (TAVR) procedure. EXAM: CT ANGIOGRAPHY CHEST, ABDOMEN AND PELVIS TECHNIQUE: Multidetector CT imaging through the chest, abdomen and pelvis was performed using the standard protocol during bolus administration of intravenous contrast. Multiplanar reconstructed images and MIPs were obtained and reviewed to evaluate the vascular anatomy.  CONTRAST:  16mL ISOVUE-370 IOPAMIDOL (ISOVUE-370) INJECTION 76% COMPARISON:  Chest CT 05/07/2011. CT the abdomen and pelvis 01/03/2017. FINDINGS: CTA CHEST FINDINGS Cardiovascular: Heart size is mildly enlarged with left ventricular concentric hypertrophy. Small amount of pericardial fluid and/or thickening. No pericardial calcification. There is aortic atherosclerosis, as well as atherosclerosis of the great vessels of the mediastinum and the coronary arteries, including calcified atherosclerotic plaque in the left main, left anterior descending, left circumflex and right coronary arteries. Severe thickening calcification of the aortic valve. Severe calcifications of the mitral annulus. Mediastinum/Lymph Nodes: No pathologically enlarged mediastinal or hilar lymph nodes. Esophagus is unremarkable in appearance. No axillary lymphadenopathy. Lungs/Pleura: Small bilateral pleural effusions. Mild ground-glass attenuation and interlobular septal thickening in the lungs bilaterally, most evident throughout the mid to lower lungs, concerning for a background of mild interstitial pulmonary edema. No confluent consolidative airspace disease. No suspicious appearing pulmonary nodules or masses are noted. Musculoskeletal/Soft Tissues: There are no aggressive appearing lytic or blastic lesions  noted in the visualized portions of the skeleton. CTA ABDOMEN AND PELVIS FINDINGS Hepatobiliary: No suspicious cystic or solid hepatic lesions. No intra or extrahepatic biliary ductal dilatation. Status post cholecystectomy. Pancreas: No pancreatic mass. No pancreatic ductal dilatation. No pancreatic or peripancreatic fluid or inflammatory changes. Spleen: Small splenule adjacent to the spleen. Otherwise, unremarkable. Adrenals/Urinary Tract: Bilateral kidneys and bilateral adrenal glands are normal in appearance. No hydroureteronephrosis. Urinary bladder is normal in appearance. Stomach/Bowel: Postoperative changes in the stomach  suggestive of prior sleeve gastrectomy. No pathologic dilatation of small bowel or colon. Normal appendix. Vascular/Lymphatic: Vascular findings and measurements pertinent to potential TAVR procedure, as detailed below. Aortic atherosclerosis, without evidence of aneurysm or dissection in the abdominal or pelvic vasculature. No lymphadenopathy noted in the abdomen or pelvis. Reproductive: Uterus and ovaries are unremarkable in appearance. Other: No significant volume of ascites.  No pneumoperitoneum. Musculoskeletal: There are no aggressive appearing lytic or blastic lesions noted in the visualized portions of the skeleton. VASCULAR MEASUREMENTS PERTINENT TO TAVR: AORTA: Minimal Aortic Diameter-12 x 13 mm Severity of Aortic Calcification-severe RIGHT PELVIS: Right Common Iliac Artery - Minimal Diameter-9.0 x 5.4 mm Tortuosity-mild Calcification-moderate Right External Iliac Artery - Minimal Diameter-6.4 x 6.5 mm Tortuosity - mild Calcification-none Right Common Femoral Artery - Minimal Diameter-8.2 x 8.2 mm Tortuosity - mild Calcification-none LEFT PELVIS: Left Common Iliac Artery - Minimal Diameter-7.5 x 6.0 mm Tortuosity - mild Calcification-moderate Left External Iliac Artery - Minimal Diameter-7.9 x 6.5 mm Tortuosity - mild Calcification-none Left Common Femoral Artery - Minimal Diameter-7.5 x 7.2 mm Tortuosity - mild Calcification-mild Review of the MIP images confirms the above findings. IMPRESSION: 1. Vascular findings and measurements pertinent to potential TAVR procedure, as detailed above. 2. Severe thickening calcification of the aortic valve, compatible with reported clinical history of severe aortic stenosis. 3. Small amount of pericardial fluid and/or thickening. No pericardial calcification. 4. Cardiomegaly with left ventricular hypertrophy. 5. Aortic atherosclerosis, in addition to left main and 3 vessel coronary artery disease. 6. Additional incidental findings, as above. Aortic Atherosclerosis  (ICD10-I70.0). Electronically Signed   By: Vinnie Langton M.D.   On: 04/19/2018 16:03   Disposition   Pt is being discharged home today in good condition.  Follow-up Plans & Appointments    Follow-up Information    Eileen Stanford, PA-C. Go on 05/24/2018.   Specialties:  Cardiology, Radiology Why:  @ 2:30pm Contact information: [place] [address] [phone]          Discharge Instructions    Amb Referral to Cardiac Rehabilitation   Complete by:  As directed    Referring to [facility]   Diagnosis:  Valve Replacement   Valve:  Aortic Comment - TAVR      Discharge Medications   Allergies as of 05/10/2018      Reactions   Atorvastatin Other (See Comments)   Myalgias   Crestor [rosuvastatin Calcium] Other (See Comments)   Stiffness and back pain   Ezetimibe-simvastatin Other (See Comments)   Leg cramps   Celebrex [celecoxib] Rash   Levemir [insulin Detemir] Rash   Penicillins Other (See Comments)   SYNCOPE (Tolerated Ancef) Has patient had a PCN reaction causing immediate rash, facial/tongue/throat swelling, SOB or lightheadedness with hypotension: Yes Has patient had a PCN reaction causing severe rash involving mucus membranes or skin necrosis: No Has patient had a PCN reaction that required hospitalization No Has patient had a PCN reaction occurring within the last 10 years: No If all  of the above answers are "NO", then may proceed with Cephalosporin use.      Medication List    STOP taking these medications   losartan 25 MG tablet Commonly known as:  COZAAR     TAKE these medications   ACCU-CHEK AVIVA PLUS test strip Generic drug:  glucose blood USE TO CHECK BLOOD SUGAR UP TO TWICE DAILY OR AS INSTRUCTED   albuterol 108 (90 Base) MCG/ACT inhaler Commonly known as:  PROVENTIL HFA;VENTOLIN HFA Inhale 1 puff into the lungs 2 (two) times daily as needed for shortness of breath.   amiodarone 200 MG  tablet Commonly known as:  PACERONE Take 1 tablet (200 mg total) by mouth daily.   apixaban 5 MG Tabs tablet Commonly known as:  ELIQUIS Take 1 tablet (5 mg total) by mouth 2 (two) times daily.   aspirin 81 MG chewable tablet Chew 1 tablet (81 mg total) by mouth daily.   B-D INS SYR ULTRAFINE 1CC/31G 31G X 5/16" 1 ML Misc Generic drug:  Insulin Syringe-Needle U-100 USE TO INJECT INSULIN TWICE A DAY AS INSTRUCTED What changed:  See the new instructions.   Calcium Carbonate-Vitamin D 600-400 MG-UNIT tablet Take 1 tablet by mouth 2 (two) times daily. What changed:  when to take this   furosemide 20 MG tablet Commonly known as:  LASIX Take 1 tablet (20 mg total) by mouth 2 (two) times daily.   Insulin Pen Needle 32G X 4 MM Misc Use to inject insulin with insulin pen   LIVALO 4 MG Tabs Generic drug:  Pitavastatin Calcium TAKE 1 TABLET BY MOUTH ONCE DAILY What changed:  how much to take   meclizine 12.5 MG tablet Commonly known as:  ANTIVERT Take 1 tablet (12.5 mg total) by mouth 3 (three) times daily as needed for dizziness.   metFORMIN 1000 MG tablet Commonly known as:  GLUCOPHAGE TAKE 1 TABLET BY MOUTH 2 TIMES A DAY   metoprolol tartrate 25 MG tablet Commonly known as:  LOPRESSOR Take 1 tablet (25 mg total) by mouth 2 (two) times daily.   NOVOLIN 70/30 RELION (70-30) 100 UNIT/ML injection Generic drug:  insulin NPH-regular Human Inject 15-65 Units into the skin See admin instructions. Taking 65 units with breakfast and 15 units at lunch and 65 units with dinner.   ondansetron 4 MG disintegrating tablet Commonly known as:  ZOFRAN-ODT Take 1 tablet (4 mg total) by mouth every 8 (eight) hours as needed for nausea.   prenatal multivitamin Tabs tablet Take 1 tablet by mouth 3 (three) times a week.   sodium chloride 0.65 % Soln nasal spray Commonly known as:  OCEAN Place 1 spray into both nostrils as needed for congestion.   SYSTANE COMPLETE 0.6 % Soln Generic drug:   Propylene Glycol Apply 1 drop to eye daily as needed (dry eyes).   triamcinolone 0.1 % cream : eucerin Crea Apply 1 application topically 2 (two) times daily as needed.   vitamin E 400 UNIT capsule Take 400 Units by mouth 3 (three) times a week.            Outstanding Labs/Studies  BMET  Duration of Discharge Encounter   Greater than 30 minutes including physician time.  Signed, Angelena Form, PA-C 05/10/2018, 10:26 AM

## 2018-05-10 NOTE — Discharge Instructions (Signed)
ACTIVITY AND EXERCISE °• Daily activity and exercise are an important part of your recovery. People recover at different rates depending on their general health and type of valve procedure. °• Most people recovering from TAVR feel better relatively quickly  °• No lifting, pushing, pulling more than 10 pounds (examples to avoid: groceries, vacuuming, gardening, golfing): °            - For one week with a procedure through the groin. °            - For six weeks for procedures through the chest wall or neck °NOTE: You will typically see one of our providers 7-14 days after your procedure to discuss WHEN TO RESUME the above activities.  °  °  °DRIVING °• Do not drive for until you are seen for follow up and cleared by a provider. Generally, we ask patient to not drive for 1 week after their procedure. °• If you have been told by your doctor in the past that you may not drive, you must talk with him/her before you begin driving again. °  °  °DRESSING °• Groin site: you may leave the clear dressing over the site for up to one week or until it falls off. °  °  °HYGIENE °• If you had a femoral (leg) procedure, you may take a shower when you return home. After the shower, pat the site dry. Do NOT use powder, oils or lotions in your groin area until the site has completely healed. °• If you had a chest procedure, you may shower when you return home unless specifically instructed not to by your discharging practitioner. °            - DO NOT scrub incision; pat dry with a towel °            - DO NOT apply any lotions, oils, powders to the incision °            - No tub baths / swimming for at least 2 weeks. °• If you notice any fevers, chills, increased pain, swelling, bleeding or pus, please contact your doctor. °  °ADDITIONAL INFORMATION °• If you are going to have an upcoming dental procedure, please contact our office as you will require antibiotics ahead of time to prevent infection on your heart valve.  ° ° °If you  have any questions or concerns you can call the structural heart phone during normal business hours 8am-4pm. If you have an urgent need after hours or weekends please call [phone] to talk to the on call provider for general cardiology. If you have an emergency that requires immediate attention, please call 911.  ° ° °After TAVR Checklist ° °Check  Test Description  ° Follow up appointment in 1-2 weeks  Most of our patients will see our structural heart physician assistant, Katie Zionna Homewood. Your incision sites will be checked and you will be cleared to drive and resume all normal activities if you are doing well.    ° 1 month echo and follow up  You will have an echo to check on your new heart valve and be seen back in the office by Katie Herron Fero. Many times the echo is not read by your appointment time, but Katie will call you later that day or the following day to report your results.  ° Follow up with your primary cardiologist You will need to be seen by your primary cardiologist in the following 3-6 months   after your 1 month appointment in the valve clinic. Often times your Plavix or Aspirin will be discontinued during this time, but this is decided on a case by case basis.    1 year echo and follow up You will have another echo to check on your heart valve after 1 year and be seen back in the office by Nell Range. This your last structural heart visit.   Bacterial endocarditis prophylaxis  You will have to take antibiotics for the rest of your life before all dental procedures (even teeth cleanings) to protect your heart valve. Antibiotics are also required before some surgeries. Please check with your cardiologist before scheduling any surgeries. Also, please make sure to tell us if you have a penicillin allergy as you will require an alternative antibiotic.    Of note, your discharge paperwork recommends Chewable aspirin, but it does not have to be chewable, just make sure you are taking a baby  Aspirin 81mg  daily.

## 2018-05-10 NOTE — Progress Notes (Signed)
Discharge instructions reviewed/read with pt and husband.  Copy of instructions given to pt. No scripts.   Pt d/c'd via wheelchair with belongings, with husband, daughter gone to get car and at [employer] entrance.           Escorted by hospital volunteer.

## 2018-05-10 NOTE — Progress Notes (Signed)
Central line d/c'd by other unit RN, pt remained on bedrest >20 minutes. Site unremarkable, dsg reinforced with more tape. Pt feeling great, getting ready to eat lunch. Family coming and will be taking her home when ready to d/c.

## 2018-05-10 NOTE — Progress Notes (Addendum)
Progress Note  Patient Name: [name] Date of Encounter: 05/10/2018  Primary Cardiologist: Minus Breeding, MD   Subjective   No chest pain. No dyspnea. She thinks her breathing is a little better but no big difference yet. She is off of supplemental O2.   Inpatient Medications    Scheduled Meds: . amiodarone  200 mg Oral Daily  . aspirin  81 mg Oral Daily  . insulin aspart  0-24 Units Subcutaneous TID AC & HS  . insulin aspart protamine- aspart  15 Units Subcutaneous Q lunch  . insulin aspart protamine- aspart  65 Units Subcutaneous BID WC  . metoprolol tartrate  25 mg Oral BID  . sodium chloride flush  3 mL Intravenous Q12H   Continuous Infusions: . sodium chloride    . nitroGLYCERIN     PRN Meds: sodium chloride, acetaminophen **OR** acetaminophen, albuterol, hydrALAZINE, metoprolol tartrate, morphine injection, ondansetron (ZOFRAN) IV, oxyCODONE, sodium chloride flush, traMADol   Vital Signs    Vitals:   05/10/18 0100 05/10/18 0124 05/10/18 0618 05/10/18 0700  BP: (!) 141/56  (!) 143/56   Pulse:    77  Resp:    (!) 21  Temp: 98.3 F (36.8 C)  98.7 F (37.1 C)   TempSrc: Oral  Oral   SpO2:    93%  Weight:  97.9 kg    Height:       No intake or output data in the 24 hours ending 05/10/18 [place] Weights   05/08/18 1956 05/09/18 0435 05/10/18 0124  Weight: 96.7 kg 97.3 kg 97.9 kg    Telemetry    Sinus, LBBB - Personally Reviewed  ECG    No AM EKG - Personally Reviewed  Physical Exam   General: Well developed, well nourished, NAD  HEENT: OP clear, mucus membranes moist  SKIN: warm, dry. No rashes. Neuro: No focal deficits  Musculoskeletal: Muscle strength 5/5 all ext  Psychiatric: Mood and affect normal  Neck: No JVD, no carotid bruits, no thyromegaly, no lymphadenopathy.  Lungs:Clear bilaterally, no wheezes, rhonci, crackles Cardiovascular: Regular rate and rhythm. No murmurs, gallops or rubs. Abdomen:Soft. Bowel sounds present.  Non-tender.  Extremities: No lower extremity edema. Pulses are 2 + in the bilateral DP/PT.   Labs    Chemistry Recent Labs  Lab 05/04/18 0934  05/08/18 1007 05/09/18 0448 05/10/18 0330  NA 141   < > 142 140 139  K 4.2   < > 4.3 4.1 3.8  CL 102   < > 104 103 105  CO2 26  --   --  25 28  GLUCOSE 232*   < > 251* 263* 143*  BUN 36*   < > 38* 35* 34*  CREATININE 1.45*   < > 1.70* 1.64* 1.70*  CALCIUM 9.0  --   --  8.3* 8.0*  PROT 6.3*  --   --   --   --   ALBUMIN 3.5  --   --   --   --   AST 16  --   --   --   --   ALT 16  --   --   --   --   ALKPHOS 55  --   --   --   --   BILITOT 1.1  --   --   --   --   GFRNONAA 36*  --   --  31* 30*  GFRAA 42*  --   --  36* 35*  ANIONGAP  13  --   --  12 6   < > = values in this interval not displayed.     Hematology Recent Labs  Lab 05/04/18 0934  05/08/18 1007 05/09/18 0448 05/10/18 0330  WBC 8.6  --   --  10.1 8.7  RBC 4.95  --   --  4.61 4.14  HGB 13.4   < > 11.9* 12.5 11.3*  HCT 42.9   < > 35.0* 39.8 35.5*  MCV 86.7  --   --  86.3 85.7  MCH 27.1  --   --  27.1 27.3  MCHC 31.2  --   --  31.4 31.8  RDW 13.9  --   --  13.8 14.2  PLT 258  --   --  162 140*   < > = values in this interval not displayed.    Cardiac EnzymesNo results for input(s): TROPONINI in the last 168 hours. No results for input(s): TROPIPOC in the last 168 hours.   BNP Recent Labs  Lab 05/04/18 0934  BNP 141.8*     DDimer No results for input(s): DDIMER in the last 168 hours.   Radiology    Dg Chest Port 1 View  Result Date: 05/08/2018 CLINICAL DATA:  Aortic valve replacement. EXAM: PORTABLE CHEST 1 VIEW COMPARISON:  05/04/2018.  02/14/2018.  01/08/2014. FINDINGS: Right IJ line noted with tip over superior vena cava. Prior cardiac valve replacement. Cardiomegaly with normal pulmonary vascularity. Mild bibasilar atelectasis and/or scarring. IMPRESSION: 1.  Right IJ line noted with tip over superior vena cava. 2.  Cardiomegaly.  No pulmonary venous  congestion. 3.  Mild bibasilar atelectasis and/or scarring. Electronically Signed   By: Marcello Moores  Register   On: 05/08/2018 12:24    Cardiac Studies     Patient Profile     67 y.o. female with chronic diastolic CHF, DM, severe AS s/p TAVR, HTN, atrial fibrillation, CKD, morbid obesity who underwent TAVR on 05/08/18.   Assessment & Plan    1. Severe aortic stenosis s/p TAVR: She is one days post TAVR with placement of a 23 mm Edwards Sapien 3 valve from the left transfemoral approach. She is doing well. Both groins are soft with no hematoma. New LBBB post TAVR. She is back on Lopressor and no evidence of high grade AV block. Echo 05/09/18 with normal LV systolic function. The bioprosthetic valve is working well with trivial PVL. Mean gradient 8 mmHg. Will continue ASA and restart Eliquis today.    2. Acute on Chronic diastolic CHF: Volume status is ok today. Creatinine is stable and just above her baseline. Will not restart Losartan until she is seen back in the office.   3. Atrial fibrillation: She is in sinus this am. Continue amiodarone, Lopressor. Will restart Eliquis.    4. Chronic kidney disease, stage 3: Will hold ARB until follow up visit.    5. Diabetes mellitus: She is back on her home insulin regimen. Resume metformin.   6. Moderate pericardial effusion: This is posterior and was present before her TAVR. She will be getting a follow up echo in one month as part of the TAVR protocol.   Discharge home today. Follow up one week in valve clinic.   For questions or updates, please contact [place] Please consult [email or web address] for contact info under        Signed, Lauree Chandler, MD  05/10/2018, 8:17 AM

## 2018-05-11 ENCOUNTER — Telehealth: Payer: Self-pay

## 2018-05-11 ENCOUNTER — Telehealth: Payer: Self-pay | Admitting: Family Medicine

## 2018-05-11 ENCOUNTER — Encounter: Payer: Self-pay | Admitting: Thoracic Surgery (Cardiothoracic Vascular Surgery)

## 2018-05-11 NOTE — Telephone Encounter (Signed)
What is the name of the medication? meclizine (ANTIVERT) 12.5 MG tablet  Have you contacted your pharmacy to request a refill? yes  Which pharmacy would you like this sent to? [facility]   Patient notified that their request is being sent to the clinical staff for review and that they should receive a call once it is complete. If they do not receive a call within 24 hours they can check with their pharmacy or our office.

## 2018-05-11 NOTE — Telephone Encounter (Signed)
Refill request

## 2018-05-11 NOTE — Telephone Encounter (Signed)
Patient contacted regarding discharge from [facility] on 05/10/2018.  Patient understands to follow up with provider Nell Range PA-C on 05/24/2018 at 2:30 PM at [facility] office. Patient understands discharge instructions? yes Patient understands medications and regiment? yes Patient understands to bring all medications to this visit? yes  The pt has not felt well since leaving the hospital.  Yesterday evening and this morning the pt has experienced symptoms of vertigo. She has a Hx of vertigo and had meclizine at home that was an old Rx from PCP.  She has taken one pill and noticed a little improvement and she only has one pill remaining. The pt did have vomiting with her dizziness.  The pt has monitored her BP, pulse, blood glucose and pulse ox at home and per pt they have been okay.  I advised the pt to remain well hydrated and use meclizine as prescribed.  If the pt has additional questions or concerns she will call [facility] at [phone].  Pt appreciated phone call.

## 2018-05-11 NOTE — Telephone Encounter (Signed)
Second call. Call patient when done.

## 2018-05-11 NOTE — Telephone Encounter (Signed)
Sent to [facility] in error.........forwarding to RX pool

## 2018-05-14 MED ORDER — MECLIZINE HCL 12.5 MG PO TABS: 12.5000 mg | ORAL_TABLET | Freq: Three times a day (TID) | ORAL | 1 refills | Status: DC | PRN

## 2018-05-14 NOTE — Telephone Encounter (Signed)
Refill sent.

## 2018-05-17 ENCOUNTER — Telehealth: Payer: Self-pay | Admitting: Physician Assistant

## 2018-05-17 ENCOUNTER — Other Ambulatory Visit: Payer: Self-pay | Admitting: Physician Assistant

## 2018-05-17 ENCOUNTER — Other Ambulatory Visit: Payer: Self-pay | Admitting: Adult Health

## 2018-05-17 MED ORDER — LOSARTAN POTASSIUM 25 MG PO TABS: 25.0000 mg | ORAL_TABLET | Freq: Every day | ORAL | 11 refills | Status: DC

## 2018-05-17 NOTE — Telephone Encounter (Signed)
  [facility]   MULTIDISCIPLINARY HEART VALVE TEAM  I spoke to Miss Superior today. Her BPs have been running high. ~180/90s. I have asked her to resume her Losartan 25mg  daily. I will check a BMET at follow up next week. She has been having vertigo as well so I asked her to continue to hold off on driving until seen back in the office.   Angelena Form PA-C  [facility]

## 2018-05-18 DIAGNOSIS — E669 Obesity, unspecified: Secondary | ICD-10-CM | POA: Diagnosis not present

## 2018-05-18 DIAGNOSIS — I10 Essential (primary) hypertension: Secondary | ICD-10-CM | POA: Diagnosis not present

## 2018-05-18 DIAGNOSIS — N189 Chronic kidney disease, unspecified: Secondary | ICD-10-CM | POA: Diagnosis not present

## 2018-05-18 DIAGNOSIS — E7800 Pure hypercholesterolemia, unspecified: Secondary | ICD-10-CM | POA: Diagnosis not present

## 2018-05-18 DIAGNOSIS — R809 Proteinuria, unspecified: Secondary | ICD-10-CM | POA: Diagnosis not present

## 2018-05-18 DIAGNOSIS — E1165 Type 2 diabetes mellitus with hyperglycemia: Secondary | ICD-10-CM | POA: Diagnosis not present

## 2018-05-23 NOTE — Progress Notes (Signed)
[facility] [place]                                       Cardiology Office Note    Date:  05/24/2018   ID:  Amaurie, Schreckengost 12/28/1950, MRN [id number]  PCP:  Chipper Herb, MD  Cardiologist:  Minus Breeding, MD / Dr. Angelena Form & Dr. Cyndia Bent (TAVR)  CC: TOC s/p TAVR   History of Present Illness:  [name] is a 67 y.o. female with a history of HTN, CKD stage III, CAD, HLD, TIA, T2DM, OSA non complaint with CPAP, persistent atrial fibrillation flutter s/p DCCV maintaining sinus rhythm on amiodarone and Eliquis and severe AS s/p TAVR who presents to clinic for follow up.   She was admitted to [facility] in 02/2018 with chest pain and found to have atrial fibrillation/flutter. She underwent a TEE directed DCCV and converted to sinus rhythm. Troponin was elevated at 7.1. Catheterization was deferred since she was fully anticoagulated prior to her cardioversion. Her TEE showed normal LV systolic function. The aortic valve was calcified with reduced leaflet mobility. The mean gradient was 32 mmHg with a peak gradient of 57 mmHg. Dimensionless index was 0.32 and aortic valve area was 0.92 cm. She has been maintained on Eliquis since her cardioversion on 02/16/2018. She subsequently underwent cardiac catheterization on 03/29/2018 by Dr. Angelena Form which showed moderate disease in the ostium of the RCA as well as in the mid RCA. There is moderate disease in the proximal left circumflex as well as the distal obtuse marginal branch. There is mild proximal LAD stenosis. The LAD tapered to a small caliber vessel distally which was diffusely diseased with 99% stenosis. The mean gradient across aortic valve was measured at 29.6 mmHg with a peak to peak gradient of 34 mm. Aortic valve area was 1.11 cm.  She reported several month history of progression of exertional shortness of breath and fatigue as well as orthopnea and lower extremity edema.  She was found  to have stage D severe, symptomatic aortic stenosis with NYHA class III symptoms.  She was evaluated by the multidisciplinary valve team and felt to be a suitable candidate for TAVR, which was set up for 05/08/2018.   She underwent successful placement of a 23 mm Edwards Sapien 3 valve from the left transfemoral approach on 05/08/2018. She developed a new LBBB post TAVR but no evidence of high grade AV block. Echo 05/09/18 showed normal LV systolic function, normally functioning bioprosthetic valve with trivial PVL and mean gradient 8 mmHg. She was discharge on ASA and Eliquis. Creat 1.7 and Losartan held at discharge. She called in later reporting high BPs and it was added back.   Today she presents to clinic for follow up. No CP or SOB. No LE edema, orthopnea or PND. No dizziness or syncope. No blood in stool or urine. No palpitations although she does think she can hear her heart beating now.  She notices that she is not having to prop up on so many pillows to breathe well.  She did have a bout of vertigo recently which is now resolved after meclizine.  She has vaginal itching consistent with yeast infection.    Past Medical History:  Diagnosis Date  . Atrial fibrillation ([place])    a. maintaining sinus s/p DCCV, on Eliquis  . CAD (coronary artery  disease)    a. Nonobstructive by cath 2006 - 25% LAD, 25-30% after 1st diag, distal 25% PDA, minor irreg LCx. b. Normal nuc 2011.  . CKD (chronic kidney disease), stage III ([place])   . Cystocele   . Difficult intubation 09-07-2012   big neck trouble with intubation parotid surgery"takes little med to sedate"  . Esophageal stricture   . GERD (gastroesophageal reflux disease)   . Hiatal hernia    a. 07/2013: HH with small stricture holding up barium tablet (concides with patient's sx of food sticking).  . History of kidney stones   . Hyperlipidemia   . Hypertension   . IBS (irritable bowel syndrome)   . Memory difficulty 11/26/2015  . Morbid obesity  ([place])    a. hx of gastric bypass (sleeve gastrectomy 2015)  . Osteoporosis   . 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  Marland Kitchen Pericardial effusion    a. Mod by echo 2012 at time of PNA. b. Echo 07/2012: small pericardial effusion vs fat.  . S/P TAVR (transcatheter aortic valve replacement)   . Severe aortic stenosis   . Sleep apnea     "have mask; don't use it" (02/14/2018)  . TIA (transient ischemic attack) 10/2014; 06/2016   "some memory issues since; daughter says my speech is sometimes different" (02/14/2018)  . Type II diabetes mellitus ([place])     Past Surgical History:  Procedure Laterality Date  . BREATH TEK H PYLORI N/A 07/29/2013   Procedure: [place];  Surgeon: Shann Medal, MD;  Location: [facility];  Service: General;  Laterality: N/A;  . CARDIAC CATHETERIZATION  2006  . CARDIOVERSION N/A 02/16/2018   Procedure: CARDIOVERSION;  Surgeon: Pixie Casino, MD;  Location: [facility];  Service: Cardiovascular;  Laterality: N/A;  . CARPAL TUNNEL RELEASE Left 2011  . CATARACT EXTRACTION W/ INTRAOCULAR LENS  IMPLANT, BILATERAL Bilateral 2003   left  . CHOLECYSTECTOMY OPEN  1982  . COLONOSCOPY N/A 07/14/2015   Procedure: COLONOSCOPY;  Surgeon: Ladene Artist, MD;  Location: [facility];  Service: Endoscopy;  Laterality: N/A;  . ESOPHAGOGASTRODUODENOSCOPY N/A 12/27/2013   Procedure: ESOPHAGOGASTRODUODENOSCOPY (EGD);  Surgeon: Shann Medal, MD;  Location: [facility];  Service: General;  Laterality: N/A;  . ESOPHAGOGASTRODUODENOSCOPY (EGD) WITH ESOPHAGEAL DILATION    . INTRAOPERATIVE TRANSTHORACIC ECHOCARDIOGRAM  05/08/2018   Procedure: INTRAOPERATIVE TRANSTHORACIC ECHOCARDIOGRAM;  Surgeon: Burnell Blanks, MD;  Location: [facility];  Service: Open Heart Surgery;;  . LAPAROSCOPIC GASTRIC SLEEVE RESECTION N/A 01/20/2014   Procedure: LAPAROSCOPIC GASTRIC SLEEVE RESECTION;  Surgeon: Shann Medal, MD;  Location: [facility];  Service: General;  Laterality:  N/A;  . PAROTIDECTOMY  09/07/2012   Procedure: PAROTIDECTOMY;  Surgeon: Melida Quitter, MD;  Location: [facility];  Service: ENT;  Laterality: Left;  LEFT PAROTIDECTOMY  . RIGHT/LEFT HEART CATH AND CORONARY ANGIOGRAPHY N/A 03/29/2018   Procedure: RIGHT/LEFT HEART CATH AND CORONARY ANGIOGRAPHY;  Surgeon: Burnell Blanks, MD;  Location: [facility];  Service: Cardiovascular;  Laterality: N/A;  . TEE WITHOUT CARDIOVERSION N/A 02/16/2018   Procedure: TRANSESOPHAGEAL ECHOCARDIOGRAM (TEE);  Surgeon: Pixie Casino, MD;  Location: [facility];  Service: Cardiovascular;  Laterality: N/A;  . TOE AMPUTATION Left 07/2017   great toe; [facility]   . TONSILLECTOMY  1968  . TRANSCATHETER AORTIC VALVE REPLACEMENT, TRANSFEMORAL  05/08/2018   Transcatheter Aortic Valve Replacement   . TRANSCATHETER AORTIC VALVE REPLACEMENT, TRANSFEMORAL N/A 05/08/2018   Procedure: TRANSCATHETER AORTIC VALVE REPLACEMENT,  TRANSFEMORAL;  Surgeon: Burnell Blanks, MD;  Location: [facility];  Service: Open Heart Surgery;  Laterality: N/A;  . TUBAL LIGATION  yrs ago  . UPPER GI ENDOSCOPY  01/20/2014   Procedure: UPPER GI ENDOSCOPY;  Surgeon: Shann Medal, MD;  Location: [facility];  Service: General;;    Current Medications: Outpatient Medications Prior to Visit  Medication Sig Dispense Refill  . ACCU-CHEK AVIVA PLUS test strip USE TO CHECK BLOOD SUGAR UP TO TWICE DAILY OR AS INSTRUCTED 100 each 2  . amiodarone (PACERONE) 200 MG tablet Take 1 tablet (200 mg total) by mouth daily. 90 tablet 3  . apixaban (ELIQUIS) 5 MG TABS tablet Take 1 tablet (5 mg total) by mouth 2 (two) times daily. 180 tablet 3  . aspirin 81 MG chewable tablet Chew 1 tablet (81 mg total) by mouth daily.    . B-D INS SYR ULTRAFINE 1CC/31G 31G X 5/16" 1 ML MISC USE TO INJECT INSULIN TWICE A DAY AS INSTRUCTED (Patient taking differently: 1 Stick by Other route 3 (three) times daily before meals. ) 100 each 2  . Calcium Carbonate-Vitamin D (CALCIUM 600+D)  600-400 MG-UNIT per tablet Take 1 tablet by mouth 2 (two) times daily. (Patient taking differently: Take 1 tablet by mouth 3 (three) times a week. )    . furosemide (LASIX) 20 MG tablet Take 20 mg by mouth daily.    . Insulin Pen Needle 32G X 4 MM MISC Use to inject insulin with insulin pen 100 each 4  . meclizine (ANTIVERT) 12.5 MG tablet Take 1 tablet (12.5 mg total) by mouth 3 (three) times daily as needed for dizziness. 30 tablet 1  . metoprolol tartrate (LOPRESSOR) 25 MG tablet Take 1 tablet (25 mg total) by mouth 2 (two) times daily. 60 tablet 6  . NOVOLIN 70/30 RELION (70-30) 100 UNIT/ML injection Inject 15-65 Units into the skin See admin instructions. Taking 65 units with breakfast and 15 units at lunch and 65 units with dinner.    . ondansetron (ZOFRAN ODT) 4 MG disintegrating tablet Take 1 tablet (4 mg total) by mouth every 8 (eight) hours as needed for nausea. 10 tablet 0  . Pitavastatin Calcium (LIVALO) 4 MG TABS Take 4 mg by mouth daily.    . Prenatal Vit-Fe Fumarate-FA (PRENATAL MULTIVITAMIN) TABS tablet Take 1 tablet by mouth 3 (three) times a week.    Marland Kitchen Propylene Glycol (SYSTANE COMPLETE) 0.6 % SOLN Apply 1 drop to eye daily as needed (dry eyes).     . sodium chloride (OCEAN) 0.65 % SOLN nasal spray Place 1 spray into both nostrils as needed for congestion. 15 mL 0  . Triamcinolone Acetonide (TRIAMCINOLONE 0.1 % CREAM : EUCERIN) CREA Apply 1 application topically 2 (two) times daily as needed.  3  . VENTOLIN HFA 108 (90 Base) MCG/ACT inhaler INHALE 1 PUFF INTO THE LUNGS 2 (TWO) TIMES DAILY AS NEEDED FOR SHORTNESS OF BREATH. 18 Inhaler 0  . vitamin E 400 UNIT capsule Take 400 Units by mouth 3 (three) times a week.     . losartan (COZAAR) 25 MG tablet Take 1 tablet (25 mg total) by mouth daily. 30 tablet 11  . furosemide (LASIX) 20 MG tablet Take 1 tablet (20 mg total) by mouth 2 (two) times daily. (Patient not taking: Reported on 05/24/2018) 60 tablet 3  . LIVALO 4 MG TABS TAKE 1  TABLET BY MOUTH ONCE DAILY (Patient not taking: No sig reported) 90 tablet 1  . metFORMIN (GLUCOPHAGE)  1000 MG tablet TAKE 1 TABLET BY MOUTH 2 TIMES A DAY (Patient not taking: No sig reported) 180 tablet 0  . metFORMIN (GLUCOPHAGE) 1000 MG tablet Take 1,000 mg by mouth 2 (two) times daily with a meal.     No facility-administered medications prior to visit.      Allergies:   Atorvastatin; Crestor [rosuvastatin calcium]; Ezetimibe-simvastatin; Statins; Celebrex [celecoxib]; Levemir [insulin detemir]; and Penicillins   Social History   Socioeconomic History  . Marital status: Married    Spouse name: Not on file  . Number of children: 3  . Years of education: some college  . Highest education level: Some college, no degree  Occupational History  . Occupation: retired    Fish farm manager: [employer]  . Financial resource strain: Not hard at all  . Food insecurity:    Worry: Never true    Inability: Never true  . Transportation needs:    Medical: No    Non-medical: No  Tobacco Use  . Smoking status: Never Smoker  . Smokeless tobacco: Never Used  Substance and Sexual Activity  . Alcohol use: Yes    Alcohol/week: 0.0 standard drinks    Comment:  "mixed drink q few years"  . Drug use: Never  . Sexual activity: Yes    Birth control/protection: Post-menopausal  Lifestyle  . Physical activity:    Days per week: 0 days    Minutes per session: 0 min  . Stress: Only a little  Relationships  . Social connections:    Talks on phone: More than three times a week    Gets together: More than three times a week    Attends religious service: More than 4 times per year    Active member of club or organization: Yes    Attends meetings of clubs or organizations: More than 4 times per year    Relationship status: Married  Other Topics Concern  . Not on file  Social History Narrative   Married   Patient is right handed.   Patient rarely drinks caffeine.   Lives at home with  husband in two story home. Husband washes laundry in the basement. They have 3 daughters and 7 grandchildren.      Family History:  The patient's family history includes Colitis in her mother; Colon polyps in her father; Diabetes in her father, maternal grandfather, mother, and other; Heart attack in her sister; Heart disease in her father; Heart failure in her father and mother; Hypertension in her mother; Kidney Stones in her mother; Skin cancer in her mother; Stroke in her maternal grandfather and mother.      ROS:   Please see the history of present illness.    ROS All other systems reviewed and are negative.   PHYSICAL EXAM:   VS:  BP (!) 144/70   Pulse 87   Ht 5' 7.5" (1.715 m)   Wt 214 lb 6.4 oz (97.3 kg)   SpO2 93%   BMI 33.08 kg/m    GEN: Well nourished, well developed, in no acute distress, morbidly obese HEENT: normal Neck: no JVD or masses Cardiac: Irregularly irregular; no murmurs, rubs, or gallops,no edema  Respiratory:  clear to auscultation bilaterally, normal work of breathing GI: soft, nontender, nondistended, + BS MS: no deformity or atrophy Skin: warm and dry, no rash.  Groin sites healing well. Neuro:  Alert and Oriented x 3, Strength and sensation are intact Psych: euthymic mood, full affect   Wt Readings from  Last 3 Encounters:  05/24/18 214 lb 6.4 oz (97.3 kg)  05/10/18 215 lb 14.4 oz (97.9 kg)  05/04/18 213 lb 3.2 oz (96.7 kg)      Studies/Labs Reviewed:   EKG:  EKG is ordered today.  The ekg ordered today demonstrates sinus with LBBB, HR 87  Recent Labs: 02/14/2018: TSH 0.780 05/04/2018: ALT 16; B Natriuretic Peptide 141.8 05/09/2018: Magnesium 1.5 05/10/2018: BUN 34; Creatinine, Ser 1.70; Hemoglobin 11.3; Platelets 140; Potassium 3.8; Sodium 139   Lipid Panel    Component Value Date/Time   CHOL 217 (H) 04/18/2018 0942   CHOL 286 (H) 01/24/2013 1100   TRIG 178 (H) 04/18/2018 0942   TRIG 200 (H) 06/03/2014 1132   TRIG 468 (H) 01/24/2013 1100     HDL 55 04/18/2018 0942   HDL 43 06/03/2014 1132   HDL 47 01/24/2013 1100   CHOLHDL 3.9 04/18/2018 0942   CHOLHDL 3.7 02/15/2018 0239   VLDL 44 (H) 02/15/2018 0239   LDLCALC 126 (H) 04/18/2018 0942   LDLCALC 101 (H) 06/03/2014 1132   LDLCALC 145 (H) 01/24/2013 1100   LDLDIRECT 132 (H) 05/09/2016 0814   LDLDIRECT 113 (H) 02/26/2013 0845    Additional studies/ records that were reviewed today include:  TAVR OPERATIVE NOTE   Date of Procedure:05/08/2018  Preoperative Diagnosis:Severe Aortic Stenosis  Procedure:   Transcatheter Aortic Valve Replacement - Transfemoral Approach Edwards Sapien 3 THV (size 7mm, model # [id number], serial [id number])  Co-Surgeons:Christopher Angelena Form, MD and Gaye Pollack, MD   Pre-operative Echo Findings: ? Severe aortic stenosis ? Normalleft ventricular systolic function  Post-operative Echo Findings: ? Noparavalvular leak ? Normalleft ventricular systolic function  _____________    Post operative echo 05/09/18 Study Conclusions - Left ventricle: The cavity size was normal. There was severe concentric hypertrophy. Systolic function was normal. The estimated ejection fraction was in the range of 60% to 65%. The study is not technically sufficient to allow evaluation of LV diastolic function. - Aortic valve: s/p TAVR - Edwards Sapien 3 valve (23 mm). No obstruction with trivial paravalvular leak. Mean gradient (S): 8 mm Hg. Peak gradient (S): 21 mm Hg. Valve area (VTI): 3.07 cm^2. Valve area (Vmax): 2.55 cm^2. Valve area (Vmean): 3.25 cm^2. - Mitral valve: Calcified annulus. Valve area by continuity equation (using LVOT flow): 2.38 cm^2. - Systemic veins: IVC measures <2.1 cm, but does not collapse >50%, suggesting an elevated RA pressure of 8 mmHg. - Pericardium, extracardiac: A moderate sized pericardial effusion was identified posterior to  the heart. Features were not consistent with tamponade physiology. Impressions: - LVEF 60-65%, stable echo post TAVR - no obstruction and trivial paravalvular leak. Moderate sized pericardial effusion is noted, mostly posterior.   ASSESSMENT & PLAN:   Severe aortic stenosis s/p TAVR:She is doing excellent.  ECG with sinus and stable LBBB.  Groin sites are stable. Continue aspirin and Eliquis.  She is aware of the need for SBE prophylaxis; clindamycin has been called in.  I will see her back in clinic for 1 month appointment and echo in early November.  Chronic diastolic CHF: she appears euvolemic. Continue Lasix 20 mg daily  HTN:  Blood pressure has been uncontrolled per review of log from home.  Losartan will be increased to 50 mg daily.  Continue Lopressor 25 mg twice daily.  She will continue to keep a log of her blood pressures and bring to her next visit.  I will check a BMET today and at next visit. If creat continues to  rise we will need to think about another medication. Of note, she is intolerant to amlodipine. We could think about switching metoprolol to Coreg for better BP control.   Permanent atrial fibrillation: Rate well controlled on Lopressor.  Continue Eliquis for thromboembolic prophylaxis.  Chronic kidney disease, stage 3: Creat baseline ~ 1.45. Creatinine 1.7 at discharge. She is on Losartan and I am increasing it. Will check a BMET and at 1 month follow up.  Moderate pericardial effusion: This is posterior and was present before her TAVR. She will be getting a follow up echo in one month as part of the TAVR protocol.   Medication Adjustments/Labs and Tests Ordered: Current medicines are reviewed at length with the patient today.  Concerns regarding medicines are outlined above.  Medication changes, Labs and Tests ordered today are listed in the Patient Instructions below. Patient Instructions  Medication Instructions:  1) INCREASE LOSARTAN to 50 mg  daily  2) You have been given a prescription for DIFLUCAN 150 mg. Take once. 3) Your physician discussed the importance of taking an antibiotic prior to any dental visits to prevent damage to the heart valves from infection. You were given a prescription for an antibiotic based on current SBE prophylaxis guidelines. You have been given a prescription for CLINDAMYCIN 600 mg to take 1 hour prior to dental visits.   Labwork: TODAY: BMET  Testing/Procedures: None  Follow-Up: Please keep your appointments on 06/21/2018. Arrive at 12:30PM for your echo and you will have an office visit with Nell Range right after your ultrasound.   Any Other Special Instructions Will Be Listed Below (If Applicable). Please keep a log of your blood pressures. Check your blood pressure 1-2 hours after you have taken your medications and when you are sitting down and calm.     Signed, Angelena Form, PA-C  05/24/2018 3:07 PM    [facility] [address], [place], [place]  [address] Phone: [phone]; Fax: [phone]

## 2018-05-24 ENCOUNTER — Ambulatory Visit (INDEPENDENT_AMBULATORY_CARE_PROVIDER_SITE_OTHER): Payer: Medicare Other | Admitting: Physician Assistant

## 2018-05-24 ENCOUNTER — Encounter: Payer: Self-pay | Admitting: Physician Assistant

## 2018-05-24 VITALS — BP 144/70 | HR 87 | Ht 67.5 in | Wt 214.4 lb

## 2018-05-24 DIAGNOSIS — I10 Essential (primary) hypertension: Secondary | ICD-10-CM

## 2018-05-24 DIAGNOSIS — N189 Chronic kidney disease, unspecified: Secondary | ICD-10-CM | POA: Diagnosis not present

## 2018-05-24 DIAGNOSIS — I4821 Permanent atrial fibrillation: Secondary | ICD-10-CM | POA: Diagnosis not present

## 2018-05-24 DIAGNOSIS — I313 Pericardial effusion (noninflammatory): Secondary | ICD-10-CM

## 2018-05-24 DIAGNOSIS — I3139 Other pericardial effusion (noninflammatory): Secondary | ICD-10-CM

## 2018-05-24 DIAGNOSIS — Z952 Presence of prosthetic heart valve: Secondary | ICD-10-CM

## 2018-05-24 DIAGNOSIS — I5032 Chronic diastolic (congestive) heart failure: Secondary | ICD-10-CM | POA: Diagnosis not present

## 2018-05-24 MED ORDER — LOSARTAN POTASSIUM 50 MG PO TABS: 50.0000 mg | ORAL_TABLET | Freq: Every day | ORAL | 3 refills | Status: DC

## 2018-05-24 MED ORDER — FLUCONAZOLE 150 MG PO TABS: 150.0000 mg | ORAL_TABLET | Freq: Once | ORAL | 5 refills | Status: AC

## 2018-05-24 MED ORDER — CLINDAMYCIN HCL 300 MG PO CAPS: ORAL_CAPSULE | ORAL | 3 refills | Status: DC

## 2018-05-24 NOTE — Patient Instructions (Addendum)
Medication Instructions:  1) INCREASE LOSARTAN to 50 mg daily  2) You have been given a prescription for DIFLUCAN 150 mg. Take once. 3) Your physician discussed the importance of taking an antibiotic prior to any dental visits to prevent damage to the heart valves from infection. You were given a prescription for an antibiotic based on current SBE prophylaxis guidelines. You have been given a prescription for CLINDAMYCIN 600 mg to take 1 hour prior to dental visits.   Labwork: TODAY: BMET  Testing/Procedures: None  Follow-Up: Please keep your appointments on 06/21/2018. Arrive at 12:30PM for your echo and you will have an office visit with Nell Range right after your ultrasound.   Any Other Special Instructions Will Be Listed Below (If Applicable). Please keep a log of your blood pressures. Check your blood pressure 1-2 hours after you have taken your medications and when you are sitting down and calm.

## 2018-05-25 LAB — BASIC METABOLIC PANEL
BUN/Creatinine Ratio: 22 (ref 12–28)
BUN: 34 mg/dL — ABNORMAL HIGH (ref 8–27)
CO2: 26 mmol/L (ref 20–29)
Calcium: 9.2 mg/dL (ref 8.7–10.3)
Chloride: 102 mmol/L (ref 96–106)
Creatinine, Ser: 1.54 mg/dL — ABNORMAL HIGH (ref 0.57–1.00)
GFR calc Af Amer: 40 mL/min/{1.73_m2} — ABNORMAL LOW (ref >59)
GFR calc non Af Amer: 35 mL/min/{1.73_m2} — ABNORMAL LOW (ref >59)
Glucose: 254 mg/dL — ABNORMAL HIGH (ref 65–99)
Potassium: 4.7 mmol/L (ref 3.5–5.2)
Sodium: 143 mmol/L (ref 134–144)

## 2018-06-05 ENCOUNTER — Other Ambulatory Visit: Payer: Self-pay | Admitting: Family Medicine

## 2018-06-15 ENCOUNTER — Observation Stay (HOSPITAL_BASED_OUTPATIENT_CLINIC_OR_DEPARTMENT_OTHER)
Admission: EM | Admit: 2018-06-15 | Discharge: 2018-06-17 | Disposition: A | Discharge status: Home / Self Care | Payer: Medicare Other | Attending: Internal Medicine | Admitting: Internal Medicine

## 2018-06-15 ENCOUNTER — Other Ambulatory Visit: Payer: Self-pay

## 2018-06-15 ENCOUNTER — Encounter (HOSPITAL_BASED_OUTPATIENT_CLINIC_OR_DEPARTMENT_OTHER): Payer: Self-pay | Admitting: Emergency Medicine

## 2018-06-15 ENCOUNTER — Emergency Department (HOSPITAL_BASED_OUTPATIENT_CLINIC_OR_DEPARTMENT_OTHER): Payer: Medicare Other

## 2018-06-15 DIAGNOSIS — I2510 Atherosclerotic heart disease of native coronary artery without angina pectoris: Secondary | ICD-10-CM | POA: Diagnosis not present

## 2018-06-15 DIAGNOSIS — E1122 Type 2 diabetes mellitus with diabetic chronic kidney disease: Secondary | ICD-10-CM | POA: Insufficient documentation

## 2018-06-15 DIAGNOSIS — R4182 Altered mental status, unspecified: Secondary | ICD-10-CM | POA: Diagnosis not present

## 2018-06-15 DIAGNOSIS — Z7901 Long term (current) use of anticoagulants: Secondary | ICD-10-CM | POA: Diagnosis not present

## 2018-06-15 DIAGNOSIS — R42 Dizziness and giddiness: Secondary | ICD-10-CM | POA: Diagnosis not present

## 2018-06-15 DIAGNOSIS — Z79899 Other long term (current) drug therapy: Secondary | ICD-10-CM | POA: Insufficient documentation

## 2018-06-15 DIAGNOSIS — N183 Chronic kidney disease, stage 3 unspecified: Secondary | ICD-10-CM | POA: Diagnosis present

## 2018-06-15 DIAGNOSIS — Z7984 Long term (current) use of oral hypoglycemic drugs: Secondary | ICD-10-CM | POA: Diagnosis not present

## 2018-06-15 DIAGNOSIS — R410 Disorientation, unspecified: Secondary | ICD-10-CM | POA: Diagnosis not present

## 2018-06-15 DIAGNOSIS — I10 Essential (primary) hypertension: Secondary | ICD-10-CM | POA: Diagnosis present

## 2018-06-15 DIAGNOSIS — G4730 Sleep apnea, unspecified: Secondary | ICD-10-CM | POA: Diagnosis present

## 2018-06-15 DIAGNOSIS — R1032 Left lower quadrant pain: Secondary | ICD-10-CM | POA: Diagnosis not present

## 2018-06-15 DIAGNOSIS — Z8673 Personal history of transient ischemic attack (TIA), and cerebral infarction without residual deficits: Secondary | ICD-10-CM | POA: Diagnosis not present

## 2018-06-15 DIAGNOSIS — R41841 Cognitive communication deficit: Secondary | ICD-10-CM | POA: Insufficient documentation

## 2018-06-15 DIAGNOSIS — I130 Hypertensive heart and chronic kidney disease with heart failure and stage 1 through stage 4 chronic kidney disease, or unspecified chronic kidney disease: Secondary | ICD-10-CM | POA: Diagnosis not present

## 2018-06-15 DIAGNOSIS — I5032 Chronic diastolic (congestive) heart failure: Secondary | ICD-10-CM | POA: Insufficient documentation

## 2018-06-15 DIAGNOSIS — Z952 Presence of prosthetic heart valve: Secondary | ICD-10-CM

## 2018-06-15 DIAGNOSIS — E785 Hyperlipidemia, unspecified: Secondary | ICD-10-CM | POA: Diagnosis present

## 2018-06-15 DIAGNOSIS — I639 Cerebral infarction, unspecified: Secondary | ICD-10-CM | POA: Insufficient documentation

## 2018-06-15 DIAGNOSIS — I4891 Unspecified atrial fibrillation: Secondary | ICD-10-CM | POA: Diagnosis present

## 2018-06-15 DIAGNOSIS — I152 Hypertension secondary to endocrine disorders: Secondary | ICD-10-CM | POA: Diagnosis present

## 2018-06-15 DIAGNOSIS — E1159 Type 2 diabetes mellitus with other circulatory complications: Secondary | ICD-10-CM | POA: Diagnosis present

## 2018-06-15 DIAGNOSIS — G459 Transient cerebral ischemic attack, unspecified: Principal | ICD-10-CM | POA: Insufficient documentation

## 2018-06-15 DIAGNOSIS — I4819 Other persistent atrial fibrillation: Secondary | ICD-10-CM | POA: Diagnosis present

## 2018-06-15 DIAGNOSIS — G9340 Encephalopathy, unspecified: Secondary | ICD-10-CM | POA: Diagnosis present

## 2018-06-15 LAB — CBC WITH DIFFERENTIAL/PLATELET
Abs Immature Granulocytes: 0.03 10*3/uL (ref 0.00–0.07)
Basophils Absolute: 0.1 10*3/uL (ref 0.0–0.1)
Basophils Relative: 1 %
Eosinophils Absolute: 0.3 10*3/uL (ref 0.0–0.5)
Eosinophils Relative: 4 %
HCT: 40.1 % (ref 36.0–46.0)
Hemoglobin: 12.7 g/dL (ref 12.0–15.0)
Immature Granulocytes: 0 %
Lymphocytes Relative: 26 %
Lymphs Abs: 2.2 10*3/uL (ref 0.7–4.0)
MCH: 26.2 pg (ref 26.0–34.0)
MCHC: 31.7 g/dL (ref 30.0–36.0)
MCV: 82.7 fL (ref 80.0–100.0)
Monocytes Absolute: 0.6 10*3/uL (ref 0.1–1.0)
Monocytes Relative: 7 %
Neutro Abs: 5 10*3/uL (ref 1.7–7.7)
Neutrophils Relative %: 62 %
Platelets: 204 10*3/uL (ref 150–400)
RBC: 4.85 MIL/uL (ref 3.87–5.11)
RDW: 14.4 % (ref 11.5–15.5)
WBC: 8.2 10*3/uL (ref 4.0–10.5)
nRBC: 0 % (ref 0.0–0.2)

## 2018-06-15 LAB — COMPREHENSIVE METABOLIC PANEL
ALT: 20 U/L (ref 0–44)
AST: 17 U/L (ref 15–41)
Albumin: 3.6 g/dL (ref 3.5–5.0)
Alkaline Phosphatase: 70 U/L (ref 38–126)
Anion gap: 11 (ref 5–15)
BUN: 31 mg/dL — ABNORMAL HIGH (ref 8–23)
CO2: 26 mmol/L (ref 22–32)
Calcium: 9.3 mg/dL (ref 8.9–10.3)
Chloride: 99 mmol/L (ref 98–111)
Creatinine, Ser: 1.51 mg/dL — ABNORMAL HIGH (ref 0.44–1.00)
GFR calc Af Amer: 40 mL/min — ABNORMAL LOW (ref >60)
GFR calc non Af Amer: 35 mL/min — ABNORMAL LOW (ref >60)
Glucose, Bld: 330 mg/dL — ABNORMAL HIGH (ref 70–99)
Potassium: 4.2 mmol/L (ref 3.5–5.1)
Sodium: 136 mmol/L (ref 135–145)
Total Bilirubin: 0.7 mg/dL (ref 0.3–1.2)
Total Protein: 6.6 g/dL (ref 6.5–8.1)

## 2018-06-15 LAB — I-STAT CG4 LACTIC ACID, ED: Lactic Acid, Venous: 1.38 mmol/L (ref 0.5–1.9)

## 2018-06-15 LAB — URINALYSIS, MICROSCOPIC (REFLEX)

## 2018-06-15 LAB — URINALYSIS, ROUTINE W REFLEX MICROSCOPIC
Bilirubin Urine: NEGATIVE
Glucose, UA: >=500 mg/dL — AB
Ketones, ur: NEGATIVE mg/dL
Leukocytes, UA: NEGATIVE
Nitrite: NEGATIVE
Protein, ur: 30 mg/dL — AB
Specific Gravity, Urine: 1.015 (ref 1.005–1.030)
pH: 6.5 (ref 5.0–8.0)

## 2018-06-15 LAB — CBG MONITORING, ED: Glucose-Capillary: 319 mg/dL — ABNORMAL HIGH (ref 70–99)

## 2018-06-15 LAB — TROPONIN I: Troponin I: 0.03 ng/mL (ref <0.03)

## 2018-06-15 MED ORDER — ONDANSETRON HCL 4 MG/2ML IJ SOLN
4.0000 mg | Freq: Once | INTRAMUSCULAR | Status: AC
  Administered 2018-06-15: 4 mg via INTRAVENOUS
  Filled 2018-06-15: qty 2

## 2018-06-15 NOTE — ED Notes (Signed)
[facility] notified (Taryn) - patient ready for transport 

## 2018-06-15 NOTE — ED Triage Notes (Addendum)
Daughter reports patient was swerving on the road around 12pm today and that she slowed down and was driving [id number].  States they believed this was due to vertigo.  Throughout the day daughter reports periods of confusion and patient cannot remember if she took medication today.  States she has had a staggering gait today.
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
## 2018-06-16 ENCOUNTER — Observation Stay (HOSPITAL_COMMUNITY): Payer: Medicare Other

## 2018-06-16 ENCOUNTER — Encounter (HOSPITAL_COMMUNITY): Payer: Self-pay | Admitting: Internal Medicine

## 2018-06-16 DIAGNOSIS — G459 Transient cerebral ischemic attack, unspecified: Secondary | ICD-10-CM | POA: Diagnosis not present

## 2018-06-16 DIAGNOSIS — I4891 Unspecified atrial fibrillation: Secondary | ICD-10-CM

## 2018-06-16 DIAGNOSIS — R42 Dizziness and giddiness: Secondary | ICD-10-CM | POA: Diagnosis not present

## 2018-06-16 DIAGNOSIS — G9340 Encephalopathy, unspecified: Secondary | ICD-10-CM | POA: Diagnosis present

## 2018-06-16 DIAGNOSIS — R410 Disorientation, unspecified: Secondary | ICD-10-CM | POA: Diagnosis not present

## 2018-06-16 LAB — HEMOGLOBIN A1C
Hgb A1c MFr Bld: 10.1 % — ABNORMAL HIGH (ref 4.8–5.6)
Mean Plasma Glucose: 243.17 mg/dL

## 2018-06-16 LAB — LIPID PANEL
Cholesterol: 192 mg/dL (ref 0–200)
HDL: 45 mg/dL (ref >40)
LDL Cholesterol: 115 mg/dL — ABNORMAL HIGH (ref 0–99)
Total CHOL/HDL Ratio: 4.3 RATIO
Triglycerides: 159 mg/dL — ABNORMAL HIGH (ref <150)
VLDL: 32 mg/dL (ref 0–40)

## 2018-06-16 LAB — TROPONIN I
Troponin I: 0.03 ng/mL (ref <0.03)
Troponin I: 0.03 ng/mL (ref <0.03)
Troponin I: <0.03 ng/mL (ref <0.03)

## 2018-06-16 LAB — GLUCOSE, CAPILLARY
Glucose-Capillary: 279 mg/dL — ABNORMAL HIGH (ref 70–99)
Glucose-Capillary: 308 mg/dL — ABNORMAL HIGH (ref 70–99)
Glucose-Capillary: 351 mg/dL — ABNORMAL HIGH (ref 70–99)
Glucose-Capillary: 200 mg/dL — ABNORMAL HIGH (ref 70–99)

## 2018-06-16 MED ORDER — POLYVINYL ALCOHOL 1.4 % OP SOLN: 1.0000 [drp] | Freq: Every day | OPHTHALMIC | Status: DC | PRN

## 2018-06-16 MED ORDER — AMIODARONE HCL 200 MG PO TABS
200.0000 mg | ORAL_TABLET | Freq: Every day | ORAL | Status: DC
  Administered 2018-06-16 – 2018-06-17 (×2): 200 mg via ORAL
  Filled 2018-06-16 (×2): qty 1

## 2018-06-16 MED ORDER — SODIUM CHLORIDE 0.9 % IV SOLN
INTRAVENOUS | Status: DC
  Administered 2018-06-16: 03:00:00 via INTRAVENOUS

## 2018-06-16 MED ORDER — ACETAMINOPHEN 325 MG PO TABS
650.0000 mg | ORAL_TABLET | ORAL | Status: DC | PRN
  Administered 2018-06-17: 650 mg via ORAL
  Filled 2018-06-16: qty 2

## 2018-06-16 MED ORDER — PRAVASTATIN SODIUM 40 MG PO TABS
80.0000 mg | ORAL_TABLET | Freq: Every day | ORAL | Status: DC
  Administered 2018-06-16: 80 mg via ORAL
  Filled 2018-06-16: qty 2

## 2018-06-16 MED ORDER — ACETAMINOPHEN 160 MG/5ML PO SOLN: 650.0000 mg | ORAL | Status: DC | PRN

## 2018-06-16 MED ORDER — METOPROLOL TARTRATE 25 MG PO TABS
25.0000 mg | ORAL_TABLET | Freq: Two times a day (BID) | ORAL | Status: DC
  Administered 2018-06-16 – 2018-06-17 (×4): 25 mg via ORAL
  Filled 2018-06-16 (×4): qty 1

## 2018-06-16 MED ORDER — APIXABAN 5 MG PO TABS
5.0000 mg | ORAL_TABLET | Freq: Two times a day (BID) | ORAL | Status: DC
  Administered 2018-06-16 – 2018-06-17 (×3): 5 mg via ORAL
  Filled 2018-06-16 (×4): qty 1

## 2018-06-16 MED ORDER — STROKE: EARLY STAGES OF RECOVERY BOOK
Freq: Once | Status: AC
  Administered 2018-06-16: 03:00:00
  Filled 2018-06-16: qty 1

## 2018-06-16 MED ORDER — INSULIN ASPART PROT & ASPART (70-30 MIX) 100 UNIT/ML ~~LOC~~ SUSP
15.0000 [IU] | Freq: Every day | SUBCUTANEOUS | Status: DC
  Administered 2018-06-16: 15 [IU] via SUBCUTANEOUS

## 2018-06-16 MED ORDER — LOSARTAN POTASSIUM 50 MG PO TABS
50.0000 mg | ORAL_TABLET | Freq: Every day | ORAL | Status: DC
  Administered 2018-06-16 – 2018-06-17 (×2): 50 mg via ORAL
  Filled 2018-06-16 (×2): qty 1

## 2018-06-16 MED ORDER — ASPIRIN 81 MG PO CHEW
81.0000 mg | CHEWABLE_TABLET | Freq: Every day | ORAL | Status: DC
  Administered 2018-06-16 – 2018-06-17 (×2): 81 mg via ORAL
  Filled 2018-06-16 (×2): qty 1

## 2018-06-16 MED ORDER — ACETAMINOPHEN 650 MG RE SUPP: 650.0000 mg | RECTAL | Status: DC | PRN

## 2018-06-16 MED ORDER — ALBUTEROL SULFATE (2.5 MG/3ML) 0.083% IN NEBU
3.0000 mL | INHALATION_SOLUTION | RESPIRATORY_TRACT | Status: DC | PRN
  Filled 2018-06-16: qty 3

## 2018-06-16 MED ORDER — INSULIN ASPART PROT & ASPART (70-30 MIX) 100 UNIT/ML ~~LOC~~ SUSP
65.0000 [IU] | Freq: Every day | SUBCUTANEOUS | Status: DC
  Administered 2018-06-16 – 2018-06-17 (×2): 65 [IU] via SUBCUTANEOUS
  Filled 2018-06-16: qty 10

## 2018-06-16 MED ORDER — ONDANSETRON HCL 4 MG PO TABS
4.0000 mg | ORAL_TABLET | Freq: Three times a day (TID) | ORAL | Status: DC | PRN
  Administered 2018-06-16 – 2018-06-17 (×2): 4 mg via ORAL
  Filled 2018-06-16 (×2): qty 1
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
## 2018-06-16 NOTE — Progress Notes (Signed)
Pt troponin= 0.03; On call provider paged to notify.

## 2018-06-16 NOTE — Evaluation (Signed)
Physical Therapy Evaluation Patient Details Name: [name] MRN: [id number] DOB: 1950/10/29 Today's Date: 06/16/2018   History of Present Illness  Pt is a 67 y/o female admitted with AMS, acute encephalopathy. Per neuro, pt did not have a stroke. PMH including but not limited to CAD, CKD, HTN, DM, s/p TAVR.    Clinical Impression  Pt presented sitting EOB, awake and willing to participate in therapy session. Prior to admission, pt reported that she was independent with all functional mobility and ADLs. Pt's spouse present throughout session as well. Pt currently requires supervision for transfers and min guard for ambulation in hallway without use of an AD. Pt with some higher level balance deficits and cognitive deficits, specifically memory. Pt would continue to benefit from skilled physical therapy services at this time while admitted and after d/c to address the below listed limitations in order to improve overall safety and independence with functional mobility.     Follow Up Recommendations Outpatient PT;Supervision for mobility/OOB    Equipment Recommendations  None recommended by PT    Recommendations for Other Services       Precautions / Restrictions Precautions Precautions: Fall Restrictions Weight Bearing Restrictions: No      Mobility  Bed Mobility               General bed mobility comments: pt sitting EOB upon arrival  Transfers Overall transfer level: Needs assistance Equipment used: None Transfers: Sit to/from Stand Sit to Stand: Supervision         General transfer comment: supervision for safety  Ambulation/Gait Ambulation/Gait assistance: Min guard Gait Distance (Feet): 200 Feet Assistive device: None Gait Pattern/deviations: Step-through pattern;Decreased stride length;Decreased step length - right;Decreased step length - left;Shuffle Gait velocity: decreased   General Gait Details: pt with shuffling gait pattern with mild  instability but no overt LOB or need for physical assistance, min guard for safety  Stairs            Wheelchair Mobility    Modified Rankin (Stroke Patients Only)       Balance Overall balance assessment: Needs assistance Sitting-balance support: Feet supported Sitting balance-Leahy Scale: Good     Standing balance support: No upper extremity supported Standing balance-Leahy Scale: Fair                               Pertinent Vitals/Pain Pain Assessment: No/denies pain    Home Living Family/patient expects to be discharged to:: Private residence Living Arrangements: Spouse/significant other Available Help at Discharge: Family Type of Home: House Home Access: Stairs to enter   Technical brewer of Steps: 3 Home Layout: Two level;Able to live on main level with bedroom/bathroom;Laundry or work area in [place]: Environmental consultant - 2 wheels;Bedside commode      Prior Function Level of Independence: Independent               Hand Dominance   Dominant Hand: Right    Extremity/Trunk Assessment   Upper Extremity Assessment Upper Extremity Assessment: Overall WFL for tasks assessed    Lower Extremity Assessment Lower Extremity Assessment: Generalized weakness    Cervical / Trunk Assessment Cervical / Trunk Assessment: Kyphotic  Communication   Communication: No difficulties  Cognition Arousal/Alertness: Awake/alert Behavior During Therapy: WFL for tasks assessed/performed Overall Cognitive Status: Impaired/Different from baseline Area of Impairment: Memory;Problem solving  Memory: Decreased short-term memory       Problem Solving: Slow processing;Difficulty sequencing        General Comments      Exercises     Assessment/Plan    PT Assessment Patient needs continued PT services  PT Problem List Decreased balance;Decreased mobility;Decreased coordination;Decreased cognition;Decreased  safety awareness       PT Treatment Interventions DME instruction;Gait training;Functional mobility training;Therapeutic activities;Neuromuscular re-education;Cognitive remediation;Balance training;Therapeutic exercise;Stair training;Patient/family education    PT Goals (Current goals can be found in the Care Plan section)  Acute Rehab PT Goals Patient Stated Goal: to see grandchildren PT Goal Formulation: With patient Time For Goal Achievement: 06/30/18 Potential to Achieve Goals: Good    Frequency Min 3X/week   Barriers to discharge        Co-evaluation               AM-PAC PT "6 Clicks" Daily Activity  Outcome Measure Difficulty turning over in bed (including adjusting bedclothes, sheets and blankets)?: None Difficulty moving from lying on back to sitting on the side of the bed? : None Difficulty sitting down on and standing up from a chair with arms (e.g., wheelchair, bedside commode, etc,.)?: A Little Help needed moving to and from a bed to chair (including a wheelchair)?: None Help needed walking in hospital room?: A Little Help needed climbing 3-5 steps with a railing? : A Little 6 Click Score: 21    End of Session Equipment Utilized During Treatment: Gait belt Activity Tolerance: Patient tolerated treatment well Patient left: in bed;with call bell/phone within reach;with family/visitor present;Other (comment)(sitting EOB) Nurse Communication: Mobility status PT Visit Diagnosis: Other abnormalities of gait and mobility (R26.89)    Time: 4462-8638 PT Time Calculation (min) (ACUTE ONLY): 19 min   Charges:   PT Evaluation $PT Eval Moderate Complexity: 1 Mod          Sherie Don, PT, DPT  Acute Rehabilitation Services Pager [phone] Office [place] 06/16/2018, 4:07 PM

## 2018-06-16 NOTE — Progress Notes (Signed)
MRI images and official report reviewed. The two bland subcentimeter deep white matter hyperintensities seen in the left cerebral hemisphere on the DWI pulse sequence appear most consistent with T2 shine through artifact than acute strokes for the following reasons: 1) no corresponding hypointensity on the ADC map; 2) low signal intensity on DWI and 3) corresponding chronic-appearing white matter hyperintensity on the FLAIR images. These would also not explain the patient's symptoms which precipitated admission.   Safe to continue apixaban. Would schedule for outpatient neurology follow up. Discussed with Dr. [name].   Electronically signed: Dr. Kerney Elbe

## 2018-06-16 NOTE — Progress Notes (Signed)
Patient did not have a stroke.  MRI abnormalities are consistent with T2 shine through artifact rather than acute strokes.  Please see Dr. Yvetta Coder note. 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  Her [place] recommends outpatient follow-up with neurology.  Physical therapy evaluation is pending as is remainder of evaluation.  Patient's hemoglobin A1c is markedly elevated at 10. Bilateral carotid studies were done in just of this year and showed 1 to 39% stenoses bilaterally and antegrade flow in the vertebral arteries.  Therefore I doubt vertebrobasilar insufficiency.  Patient to be seen by physical therapy for evaluation of possible BPPV.

## 2018-06-16 NOTE — Evaluation (Signed)
Speech Language Pathology Evaluation Patient Details Name: [name] MRN: [id number] DOB: 07-17-1951 Today's Date: 06/16/2018 Time: 8469-6295 SLP Time Calculation (min) (ACUTE ONLY): 42 min  Problem List:  Patient Active Problem List   Diagnosis Date Noted  . Acute encephalopathy 06/16/2018  . Stroke (cerebrum) ([place]) 06/15/2018  . Acute on chronic diastolic heart failure ([place]) 05/10/2018  . Morbid obesity ([place])   . Hypertension   . CKD (chronic kidney disease), stage III ([place])   . Sleep apnea   . Hyperlipidemia   . GERD (gastroesophageal reflux disease)   . Atrial fibrillation ([place])   . S/P TAVR (transcatheter aortic valve replacement)   . Severe aortic stenosis   . Vertigo 05/06/2015  . TIA (transient ischemic attack), history of 11/07/2014  . 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 CKD stage 3 due to type 2 diabetes mellitus ([place]) 06/03/2014  . Type II diabetes mellitus with renal manifestations ([place]) 06/03/2014  . Type II diabetes mellitus with peripheral circulatory disorder ([place]) 06/03/2014  . Benign essential HTN 12/26/2008  . Coronary artery disease, non-occlusive 12/26/2008  . Pericardial effusion 12/26/2008   Past Medical History:  Past Medical History:  Diagnosis Date  . Atrial fibrillation ([place])    a. maintaining sinus s/p DCCV, on Eliquis  . CAD (coronary artery disease)    a. Nonobstructive by cath 2006 - 25% LAD, 25-30% after 1st diag, distal 25% PDA, minor irreg LCx. b. Normal nuc 2011.  . CKD (chronic kidney disease), stage III ([place])   . Cystocele   . Difficult intubation 09-07-2012   big neck trouble with intubation parotid surgery"takes little med to sedate"  . Esophageal stricture   . GERD (gastroesophageal reflux disease)   . Hiatal hernia    a. 07/2013: HH with small stricture holding up barium tablet (concides with patient's sx of food sticking).  . History of kidney stones   . Hyperlipidemia   . Hypertension   . IBS (irritable bowel  syndrome)   . Memory difficulty 11/26/2015  . Morbid obesity ([place])    a. hx of gastric bypass (sleeve gastrectomy 2015)  . Osteoporosis   . 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  Marland Kitchen Pericardial effusion    a. Mod by echo 2012 at time of PNA. b. Echo 07/2012: small pericardial effusion vs fat.  . S/P TAVR (transcatheter aortic valve replacement)   . Severe aortic stenosis   . Sleep apnea     "have mask; don't use it" (02/14/2018)  . TIA (transient ischemic attack) 10/2014; 06/2016   "some memory issues since; daughter says my speech is sometimes different" (02/14/2018)  . Type II diabetes mellitus ([place])    Past Surgical History:  Past Surgical History:  Procedure Laterality Date  . BREATH TEK H PYLORI N/A 07/29/2013   Procedure: [place];  Surgeon: Shann Medal, MD;  Location: [facility];  Service: General;  Laterality: N/A;  . CARDIAC CATHETERIZATION  2006  . CARDIOVERSION N/A 02/16/2018   Procedure: CARDIOVERSION;  Surgeon: Pixie Casino, MD;  Location: [facility];  Service: Cardiovascular;  Laterality: N/A;  . CARPAL TUNNEL RELEASE Left 2011  . CATARACT EXTRACTION W/ INTRAOCULAR LENS  IMPLANT, BILATERAL Bilateral 2003   left  . CHOLECYSTECTOMY OPEN  1982  . COLONOSCOPY N/A 07/14/2015   Procedure: COLONOSCOPY;  Surgeon: Ladene Artist, MD;  Location: [facility];  Service: Endoscopy;  Laterality: N/A;  . ESOPHAGOGASTRODUODENOSCOPY N/A 12/27/2013   Procedure: ESOPHAGOGASTRODUODENOSCOPY (EGD);  Surgeon: Shann Medal, MD;  Location: [facility];  Service: General;  Laterality: N/A;  . ESOPHAGOGASTRODUODENOSCOPY (EGD) WITH ESOPHAGEAL DILATION    . INTRAOPERATIVE TRANSTHORACIC ECHOCARDIOGRAM  05/08/2018   Procedure: INTRAOPERATIVE TRANSTHORACIC ECHOCARDIOGRAM;  Surgeon: Burnell Blanks, MD;  Location: [facility];  Service: Open Heart Surgery;;  . LAPAROSCOPIC GASTRIC SLEEVE RESECTION N/A 01/20/2014   Procedure: LAPAROSCOPIC GASTRIC SLEEVE RESECTION;   Surgeon: Shann Medal, MD;  Location: [facility];  Service: General;  Laterality: N/A;  . PAROTIDECTOMY  09/07/2012   Procedure: PAROTIDECTOMY;  Surgeon: Melida Quitter, MD;  Location: [facility];  Service: ENT;  Laterality: Left;  LEFT PAROTIDECTOMY  . RIGHT/LEFT HEART CATH AND CORONARY ANGIOGRAPHY N/A 03/29/2018   Procedure: RIGHT/LEFT HEART CATH AND CORONARY ANGIOGRAPHY;  Surgeon: Burnell Blanks, MD;  Location: [facility];  Service: Cardiovascular;  Laterality: N/A;  . TEE WITHOUT CARDIOVERSION N/A 02/16/2018   Procedure: TRANSESOPHAGEAL ECHOCARDIOGRAM (TEE);  Surgeon: Pixie Casino, MD;  Location: [facility];  Service: Cardiovascular;  Laterality: N/A;  . TOE AMPUTATION Left 07/2017   great toe; [facility]   . TONSILLECTOMY  1968  . TRANSCATHETER AORTIC VALVE REPLACEMENT, TRANSFEMORAL  05/08/2018   Transcatheter Aortic Valve Replacement   . TRANSCATHETER AORTIC VALVE REPLACEMENT, TRANSFEMORAL N/A 05/08/2018   Procedure: TRANSCATHETER AORTIC VALVE REPLACEMENT, TRANSFEMORAL;  Surgeon: Burnell Blanks, MD;  Location: [facility];  Service: Open Heart Surgery;  Laterality: N/A;  . TUBAL LIGATION  yrs ago  . 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  Pt is retired from [employer] takes care of her 69 year old grandchild.  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  Pt reports h/o memory deficits. She lives with her husband who does outside work rountinely and inside work at times.  Per pt, spouse will likely not help with medications, etc - but she states her daughters will.     Assessment / Plan / Recommendation Clinical Impression  [name] was administered original [place] with her scoring 20/30 indicative of a mild cognitive deficit.  Obvious areas of difficulties noted in  sustained attention resulting in pt needing repetition of several directions.  This decreased attention also impacting pt's ability to repeat lengthy sentences and follow complex conversations.  Pt admits to baseline memory deficits but admits her attention is more impaired currently.  Pt recalled 2/5 words independently, 3/5 with multiple choice.  She was oriented x4 and no dysarthria/aphasia noted.  Of note, pt reports she used to enjoy reading but not anymore - ? some impaired attention PTA.   Recommend follow up SlP with [place] in functional environment to maximize pt's attention and functional memory skills. Pt agreeable to plan.  Reviewed modifications to environment to [facility] attention/recall.  Thanks for this order.      SLP Assessment  SLP Recommendation/Assessment: All further Speech Lanaguage Pathology  needs can be addressed in the next venue of care SLP Visit Diagnosis: Attention and concentration deficit;Cognitive communication deficit (R41.841) Attention and concentration deficit following: Cerebral infarction    Follow Up Recommendations  Home health SLP    Frequency and Duration           SLP Evaluation Cognition  Overall Cognitive Status: (baseline memory deficits) Arousal/Alertness: Awake/alert Orientation  Level: Oriented X4 Attention: Sustained Sustained Attention: Impaired Sustained Attention Impairment: Verbal complex Memory: Impaired Memory Impairment: Retrieval deficit(recalled 2/5 words I, 3/5 with cues) Awareness: Appears intact Problem Solving: Impaired Problem Solving Impairment: Verbal complex       Comprehension  Auditory Comprehension Overall Auditory Comprehension: Appears within functional limits for tasks assessed Yes/No Questions: Not tested Commands: Within Functional Limits Conversation: Complex Interfering Components: Attention Visual Recognition/Discrimination Discrimination: Within Function Limits Reading Comprehension Reading Status: Not  tested    Expression Expression Primary Mode of Expression: Verbal Verbal Expression Overall Verbal Expression: Appears within functional limits for tasks assessed Initiation: No impairment Repetition: Impaired Level of Impairment: Sentence level(complex sentence level) Naming: No impairment Pragmatics: No impairment Interfering Components: Attention Written Expression Dominant Hand: Right   Oral / Motor  Oral Motor/Sensory Function Overall Oral Motor/Sensory Function: Within functional limits Motor Speech Overall Motor Speech: Appears within functional limits for tasks assessed Respiration: Within functional limits Phonation: Normal Resonance: Within functional limits Articulation: Within functional limitis Intelligibility: Intelligible Motor Planning: Witnin functional limits Motor Speech Errors: Not applicable   GO                    Macario Golds 06/16/2018, 10:17 AM   Luanna Salk, MS [facility] SLP Acute Rehab Services Pager [phone] Office [phone]

## 2018-06-16 NOTE — Evaluation (Signed)
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 Pt currently with functional limitiations due to the deficits listed below (see OT problem list). Pt reports "I knew I was swerving on the road but didn't know anyone else could tell"  Spoke to daughter present about concern for patient taking care of 67 year old alone and also ability to drive at this time. MD please address concerns with family and patient. Daughter present reports she will talk to her sister (Therapist, sports at [employer] long) regarding the 67 yo. Pt lacks awareness to deficits. Pt unable to complete path finding back to the room with mod cues. Pt could become lost in the car and high risk for having an accident due to cognitive deficits.  Pt will benefit from skilled OT to increase their independence and safety with adls and balance to allow discharge home.     Follow Up Recommendations  No OT follow up    Equipment Recommendations  None recommended by OT    Recommendations for Other Services       Precautions / Restrictions Precautions Precautions: Fall      Mobility Bed Mobility Overal bed mobility: Needs Assistance Bed Mobility: Supine to Sit     Supine to sit: Supervision;HOB elevated     General bed mobility comments: pt able to progress to EOB  Transfers Overall transfer level: Needs assistance   Transfers: Sit to/from Stand Sit to Stand: Supervision         General transfer comment: requires use of bil UE    Balance                                           ADL either performed or assessed with clinical judgement   ADL Overall ADL's : Needs  assistance/impaired Eating/Feeding: Independent   Grooming: Oral care;Supervision/safety;Standing Grooming Details (indicate cue type and reason): sink level Upper Body Bathing: Supervision/ safety   Lower Body Bathing: Min guard           Toilet Transfer: Min guard           Functional mobility during ADLs: Min guard General ADL Comments: Pt with path finding deficits and unable to return to room despite being able to report "room 20" pt needed cues to use signs and then further cues to "which number would 20 fall between" pt self reports to daughter present "i told you my biggest complaint is my memory" pt asked by MD regarding her dizziness and reports not history of vestibular issues. Daughter reports x2 years ago a tumor surgery to remove it from inner ear and since that time needing medication for dizziness     Vision Baseline Vision/History: Wears glasses Wears Glasses: At all times       Perception     Praxis      Pertinent Vitals/Pain Pain Assessment: No/denies pain     Hand Dominance Right   Extremity/Trunk Assessment Upper Extremity Assessment Upper Extremity Assessment: Overall WFL for tasks assessed   Lower Extremity Assessment Lower Extremity Assessment: Defer to PT evaluation   Cervical / Trunk  Assessment Cervical / Trunk Assessment: Kyphotic   Communication Communication Communication: No difficulties   Cognition Arousal/Alertness: Awake/alert Behavior During Therapy: WFL for tasks assessed/performed Overall Cognitive Status: (baseline memory deficits)                                     General Comments       Exercises     Shoulder Instructions      Home Living Family/patient expects to be discharged to:: Private residence Living Arrangements: Spouse/significant other Available Help at Discharge: Family Type of Home: House Home Access: Stairs to enter Technical brewer of Steps: 3   Home Layout: Two  level;Able to live on main level with bedroom/bathroom;Laundry or work area in [place] Shower/Tub: Teacher, early years/pre: [place]: Environmental consultant - 2 wheels;Bedside commode   Additional Comments: watches a 67 yo alone at times  Lives With: Spouse(husband of 49 years- 3 back surgeries)    Prior Functioning/Environment Level of Independence: Independent                 OT Problem List: Decreased activity tolerance;Impaired balance (sitting and/or standing);Decreased cognition;Decreased safety awareness;Obesity      OT Treatment/Interventions: Self-care/ADL training;Therapeutic exercise;DME and/or AE instruction;Therapeutic activities;Cognitive remediation/compensation;Patient/family education;Balance training    OT Goals(Current goals can be found in the care plan section) Acute Rehab OT Goals Patient Stated Goal: to see grandchildren OT Goal Formulation: With patient/family Time For Goal Achievement: 06/30/18 Potential to Achieve Goals: Good  OT Frequency: Min 2X/week   Barriers to D/C:            Co-evaluation              AM-PAC PT "6 Clicks" Daily Activity     Outcome Measure Help from another person eating meals?: None Help from another person taking care of personal grooming?: None Help from another person toileting, which includes using toliet, bedpan, or urinal?: A Little Help from another person bathing (including washing, rinsing, drying)?: A Little Help from another person to put on and taking off regular upper body clothing?: A Little Help from another person to put on and taking off regular lower body clothing?: A Little 6 Click Score: 20   End of Session Nurse Communication: Mobility status;Precautions  Activity Tolerance: Patient tolerated treatment well Patient left: in bed;with call bell/phone within reach;with family/visitor present;with nursing/sitter in room  OT Visit Diagnosis: Unsteadiness on feet  (R26.81)                Time: 2423-5361 OT Time Calculation (min): 27 min Charges:  OT General Charges $OT Visit: 1 Visit OT Evaluation $OT Eval Moderate Complexity: 1 Mod OT Treatments $Self Care/Home Management : 8-22 mins   Jeri Modena, OTR/L  Acute Rehabilitation Services Pager: [phone] Office: [phone] .   Parke Poisson B 06/16/2018, 1:07 PM

## 2018-06-17 DIAGNOSIS — G459 Transient cerebral ischemic attack, unspecified: Secondary | ICD-10-CM | POA: Diagnosis not present

## 2018-06-17 DIAGNOSIS — G9340 Encephalopathy, unspecified: Secondary | ICD-10-CM | POA: Diagnosis not present

## 2018-06-17 LAB — GLUCOSE, CAPILLARY: Glucose-Capillary: 189 mg/dL — ABNORMAL HIGH (ref 70–99)

## 2018-06-17 NOTE — Progress Notes (Signed)
Referral placed for OP PT
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
## 2018-06-17 NOTE — Progress Notes (Signed)
Patient instructed on discharge papers, iv removed, taken to patient pick up in a wheelchair.

## 2018-06-17 NOTE — Plan of Care (Signed)

## 2018-06-19 ENCOUNTER — Ambulatory Visit (INDEPENDENT_AMBULATORY_CARE_PROVIDER_SITE_OTHER): Payer: Medicare Other | Admitting: Family Medicine

## 2018-06-19 ENCOUNTER — Encounter: Payer: Self-pay | Admitting: Family Medicine

## 2018-06-19 VITALS — BP 200/70 | HR 69 | Temp 97.2°F | Ht 68.0 in | Wt 208.0 lb

## 2018-06-19 DIAGNOSIS — Z09 Encounter for follow-up examination after completed treatment for conditions other than malignant neoplasm: Secondary | ICD-10-CM

## 2018-06-19 DIAGNOSIS — I10 Essential (primary) hypertension: Secondary | ICD-10-CM | POA: Diagnosis not present

## 2018-06-19 LAB — MICROSCOPIC EXAMINATION
RBC, UA: NONE SEEN /hpf (ref 0–2)
Renal Epithel, UA: NONE SEEN /hpf

## 2018-06-19 LAB — URINALYSIS, COMPLETE
Bilirubin, UA: NEGATIVE
Ketones, UA: NEGATIVE
Leukocytes, UA: NEGATIVE
Nitrite, UA: NEGATIVE
Protein, UA: NEGATIVE
Specific Gravity, UA: 1.015 (ref 1.005–1.030)
Urobilinogen, Ur: 0.2 mg/dL (ref 0.2–1.0)
pH, UA: 5 (ref 5.0–7.5)

## 2018-06-19 MED ORDER — MUPIROCIN 2 % EX OINT: 1.0000 "application " | TOPICAL_OINTMENT | Freq: Two times a day (BID) | CUTANEOUS | 0 refills | Status: DC

## 2018-06-19 NOTE — Patient Instructions (Addendum)
Follow-up with cardiology as planned Do better with keeping blood sugars are not under control and keep these checked regularly and be sure and take readings with you when you go back to see Dr. Michiel Sites the endocrinologist. We will also arrange for you to see Dr. Jannifer Franklin to follow-up on this altered mental status event which could very well be due to an elevated blood sugar. Today we will get a BMP and urinalysis and call you with those results as soon as they become available. Blood pressure repeat by me today was 195/70 in the right arm and 200/70 in the left arm with a large cuff. Take an extra half of a losartan tonight or 25 mg at night and 50 in the morning We will call with results of lab work as soon as the results are returned

## 2018-06-19 NOTE — Progress Notes (Signed)
[facility] [place]                                       Cardiology Office Note    Date:  06/21/2018   ID:  [name], [name] Mar 16, 1951, MRN [id number]  PCP:  Chipper Herb, MD  Cardiologist:  Minus Breeding, MD/ Dr. Angelena Form & Dr. Cyndia Bent (TAVR)  CC: 1 month s/p TAVR  History of Present Illness:  [name] is a 67 y.o. female with a history of HTN, CKDstage III,CAD, HLD, TIA, T2DM,OSA non complaint with CPAP,persistent atrial fibrillation flutters/pDCCV maintaining sinus rhythm on amiodarone and Eliquis and severe ASs/p TAVR who presents to clinic for follow up.   She was admitted to [facility] in 02/2018 with chest pain and found to have atrial fibrillation/flutter.She underwent a TEE directed DCCV and converted to sinus rhythm. Troponin was elevated at 7.1. Catheterization was deferred since she was fully anticoagulated prior to her cardioversion. Her TEE showed normal LV systolic function. The aortic valve was calcified with reduced leaflet mobility. The mean gradient was 32 mmHg with a peak gradient of 57 mmHg. Dimensionless index was 0.32 and aortic valve area was 0.92 cm. She subsequently underwent cardiac catheterization on 03/29/2018 by Dr. Angelena Form which showed moderate disease of RCA and LCx; the LAD tapered to a small caliber vessel distally which was diffusely diseased with 99% stenosis. Medical therapy was recommended.   She underwent successful placement of a23 mm Edwards Sapien 3 valve from the left transfemoral approach on 05/08/2018. She developed a new LBBB post TAVR but no evidence of high grade AV block. Echo 05/09/18 showed normal LV systolic function, normally functioning bioprosthetic valve with trivial PVL and mean gradient 8 mmHg. She was discharge on ASA and Eliquis. Creat 1.7 and Losartan held at discharge. She called in later reporting high BPs and it was added back. Her BP continued to be elevated and  Losartan increased to 50mg  daily at follow up appointment.   She was recently admitted 11/1-11/4 for AMS. Initially it was felt she had a stroke but later neurology determined she did not have a stroke. She was seen by her PCP on 06/19/18 and BP was significantly elevated. Losartan was increased to 75 mg daily. Creat 1.63 by labwork at PCP.  Today she presents to clinic for follow up. Having some dizziness today; has vertigo. Overall hasn't been very active since being in the hospital and having vertigo. She can tell a difference in her breathing since TAVR. No CP or SOB. No LE edema, orthopnea or PND. No dizziness or syncope. No blood in stool or urine. No palpitations.     Past Medical History:  Diagnosis Date  . Atrial fibrillation ([place])    a. maintaining sinus s/p DCCV, on Eliquis  . CAD (coronary artery disease)    a. Nonobstructive by cath 2006 - 25% LAD, 25-30% after 1st diag, distal 25% PDA, minor irreg LCx. b. Normal nuc 2011.  . CKD (chronic kidney disease), stage III ([place])   . Cystocele   . Difficult intubation 09-07-2012   big neck trouble with intubation parotid surgery"takes little med to sedate"  . Esophageal stricture   . GERD (gastroesophageal reflux disease)   . Hiatal hernia    a. 07/2013: HH with small stricture holding up barium tablet (concides with patient's sx of food sticking).  Marland Kitchen  History of kidney stones   . Hyperlipidemia   . Hypertension   . IBS (irritable bowel syndrome)   . Memory difficulty 11/26/2015  . Morbid obesity ([place])    a. hx of gastric bypass (sleeve gastrectomy 2015)  . Osteoporosis   . 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  Marland Kitchen Pericardial effusion    a. Mod by echo 2012 at time of PNA. b. Echo 07/2012: small pericardial effusion vs fat.  . S/P TAVR (transcatheter aortic valve replacement)   . Severe aortic stenosis   . Sleep apnea     "have mask; don't use it" (02/14/2018)  . TIA (transient ischemic attack) 10/2014; 06/2016     "some memory issues since; daughter says my speech is sometimes different" (02/14/2018)  . Type II diabetes mellitus ([place])     Past Surgical History:  Procedure Laterality Date  . BREATH TEK H PYLORI N/A 07/29/2013   Procedure: [place];  Surgeon: Shann Medal, MD;  Location: [facility];  Service: General;  Laterality: N/A;  . CARDIAC CATHETERIZATION  2006  . CARDIOVERSION N/A 02/16/2018   Procedure: CARDIOVERSION;  Surgeon: Pixie Casino, MD;  Location: [place];  Service: Cardiovascular;  Laterality: N/A;  . CARPAL TUNNEL RELEASE Left 2011  . CATARACT EXTRACTION W/ INTRAOCULAR LENS  IMPLANT, BILATERAL Bilateral 2003   left  . CHOLECYSTECTOMY OPEN  1982  . COLONOSCOPY N/A 07/14/2015   Procedure: COLONOSCOPY;  Surgeon: Ladene Artist, MD;  Location: [facility];  Service: Endoscopy;  Laterality: N/A;  . ESOPHAGOGASTRODUODENOSCOPY N/A 12/27/2013   Procedure: ESOPHAGOGASTRODUODENOSCOPY (EGD);  Surgeon: Shann Medal, MD;  Location: [facility];  Service: General;  Laterality: N/A;  . ESOPHAGOGASTRODUODENOSCOPY (EGD) WITH ESOPHAGEAL DILATION    . INTRAOPERATIVE TRANSTHORACIC ECHOCARDIOGRAM  05/08/2018   Procedure: INTRAOPERATIVE TRANSTHORACIC ECHOCARDIOGRAM;  Surgeon: Burnell Blanks, MD;  Location: [place];  Service: Open Heart Surgery;;  . LAPAROSCOPIC GASTRIC SLEEVE RESECTION N/A 01/20/2014   Procedure: LAPAROSCOPIC GASTRIC SLEEVE RESECTION;  Surgeon: Shann Medal, MD;  Location: [facility];  Service: General;  Laterality: N/A;  . PAROTIDECTOMY  09/07/2012   Procedure: PAROTIDECTOMY;  Surgeon: Melida Quitter, MD;  Location: [place];  Service: ENT;  Laterality: Left;  LEFT PAROTIDECTOMY  . RIGHT/LEFT HEART CATH AND CORONARY ANGIOGRAPHY N/A 03/29/2018   Procedure: RIGHT/LEFT HEART CATH AND CORONARY ANGIOGRAPHY;  Surgeon: Burnell Blanks, MD;  Location: [facility];  Service: Cardiovascular;  Laterality: N/A;  . TEE WITHOUT CARDIOVERSION N/A 02/16/2018    Procedure: TRANSESOPHAGEAL ECHOCARDIOGRAM (TEE);  Surgeon: Pixie Casino, MD;  Location: [facility];  Service: Cardiovascular;  Laterality: N/A;  . TOE AMPUTATION Left 07/2017   great toe; [facility]   . TONSILLECTOMY  1968  . TRANSCATHETER AORTIC VALVE REPLACEMENT, TRANSFEMORAL  05/08/2018   Transcatheter Aortic Valve Replacement   . TRANSCATHETER AORTIC VALVE REPLACEMENT, TRANSFEMORAL N/A 05/08/2018   Procedure: TRANSCATHETER AORTIC VALVE REPLACEMENT, TRANSFEMORAL;  Surgeon: Burnell Blanks, MD;  Location: [place];  Service: Open Heart Surgery;  Laterality: N/A;  . TUBAL LIGATION  yrs ago  . UPPER GI ENDOSCOPY  01/20/2014   Procedure: UPPER GI ENDOSCOPY;  Surgeon: Shann Medal, MD;  Location: [facility];  Service: General;;    Current Medications: Outpatient Medications Prior to Visit  Medication Sig Dispense Refill  . ACCU-CHEK AVIVA PLUS test strip USE TO CHECK BLOOD SUGAR UP TO TWICE DAILY OR AS INSTRUCTED 100 each 2  . amiodarone (PACERONE) 200 MG  tablet Take 1 tablet (200 mg total) by mouth daily. 90 tablet 3  . apixaban (ELIQUIS) 5 MG TABS tablet Take 1 tablet (5 mg total) by mouth 2 (two) times daily. 180 tablet 3  . aspirin 81 MG chewable tablet Chew 1 tablet (81 mg total) by mouth daily.    . B-D INS SYR ULTRAFINE 1CC/31G 31G X 5/16" 1 ML MISC USE TO INJECT INSULIN TWICE A DAY AS INSTRUCTED (Patient taking differently: 1 Stick by Other route 3 (three) times daily before meals. ) 100 each 2  . Calcium Carbonate-Vitamin D (CALCIUM 600+D) 600-400 MG-UNIT per tablet Take 1 tablet by mouth 2 (two) times daily. (Patient taking differently: Take 1 tablet by mouth 3 (three) times a week. )    . clindamycin (CLEOCIN) 300 MG capsule Take 2 tablets (600 mg) one hour prior to dental visits. 6 capsule 3  . furosemide (LASIX) 20 MG tablet Take 20 mg by mouth daily.    . Insulin Pen Needle 32G X 4 MM MISC Use to inject insulin with insulin pen 100 each 4  . losartan (COZAAR) 50 MG tablet Take  50 mg by mouth as directed. Take 1 tablet (50 mg) in the am take one half tablet (25mg ) in the pm    . mupirocin ointment (BACTROBAN) 2 % Apply 1 application topically 2 (two) times daily. 22 g 0  . NOVOLIN 70/30 RELION (70-30) 100 UNIT/ML injection Inject 15-65 Units into the skin See admin instructions. Taking 65 units with breakfast and 15 units at lunch and 65 units with dinner.    . ondansetron (ZOFRAN ODT) 4 MG disintegrating tablet Take 1 tablet (4 mg total) by mouth every 8 (eight) hours as needed for nausea. 10 tablet 0  . Pitavastatin Calcium (LIVALO) 4 MG TABS Take 4 mg by mouth daily.    . Prenatal Vit-Fe Fumarate-FA (PRENATAL MULTIVITAMIN) TABS tablet Take 1 tablet by mouth 3 (three) times a week.    Marland Kitchen Propylene Glycol (SYSTANE COMPLETE) 0.6 % SOLN Apply 1 drop to eye daily as needed (dry eyes).     . sodium chloride (OCEAN) 0.65 % SOLN nasal spray Place 1 spray into both nostrils as needed for congestion. 15 mL 0  . Triamcinolone Acetonide (TRIAMCINOLONE 0.1 % CREAM : EUCERIN) CREA Apply 1 application topically 2 (two) times daily as needed.  3  . vitamin E 400 UNIT capsule Take 400 Units by mouth 3 (three) times a week.     . metoprolol tartrate (LOPRESSOR) 25 MG tablet Take 1 tablet (25 mg total) by mouth 2 (two) times daily. 60 tablet 6  . losartan (COZAAR) 50 MG tablet Take 1 tablet (50 mg total) by mouth daily. (Patient taking differently: Take 75 mg by mouth daily. Take 50 mg in AM and 25 mg in PM (changed 06/19/18)) 90 tablet 3  . metFORMIN (GLUCOPHAGE) 1000 MG tablet TAKE 1 TABLET BY MOUTH 2 TIMES A DAY 180 tablet 0   No facility-administered medications prior to visit.      Allergies:   Atorvastatin; Crestor [rosuvastatin calcium]; Ezetimibe-simvastatin; Statins; Celebrex [celecoxib]; Levemir [insulin detemir]; and Penicillins   Social History   Socioeconomic History  . Marital status: Married    Spouse name: Not on file  . Number of children: 3  . Years of education:  some college  . Highest education level: Some college, no degree  Occupational History  . Occupation: retired    Fish farm manager: [employer]  . Emergency planning/management officer  strain: Not hard at all  . Food insecurity:    Worry: Never true    Inability: Never true  . Transportation needs:    Medical: No    Non-medical: No  Tobacco Use  . Smoking status: Never Smoker  . Smokeless tobacco: Never Used  Substance and Sexual Activity  . Alcohol use: Yes    Alcohol/week: 0.0 standard drinks    Comment:  "mixed drink q few years"  . Drug use: Never  . Sexual activity: Yes    Birth control/protection: Post-menopausal  Lifestyle  . Physical activity:    Days per week: 0 days    Minutes per session: 0 min  . Stress: Only a little  Relationships  . Social connections:    Talks on phone: More than three times a week    Gets together: More than three times a week    Attends religious service: More than 4 times per year    Active member of club or organization: Yes    Attends meetings of clubs or organizations: More than 4 times per year    Relationship status: Married  Other Topics Concern  . Not on file  Social History Narrative   Married   Patient is right handed.   Patient rarely drinks caffeine.   Lives at home with husband in two story home. Husband washes laundry in the basement. They have 3 daughters and 7 grandchildren.      Family History:  The patient's family history includes Colitis in her mother; Colon polyps in her father; Diabetes in her father, maternal grandfather, mother, and other; Heart attack in her sister; Heart disease in her father; Heart failure in her father and mother; Hypertension in her mother; Kidney Stones in her mother; Skin cancer in her mother; Stroke in her maternal grandfather and mother.     ROS:   Please see the history of present illness.    ROS All other systems reviewed and are negative.   PHYSICAL EXAM:   VS:  BP (!) 190/62 (BP  Location: Left Arm, Patient Position: Sitting, Cuff Size: Normal)   Pulse (!) 56   Ht 5\' 7"  (1.702 m)   Wt 208 lb 8 oz (94.6 kg)   SpO2 96% Comment: at rest  BMI 32.66 kg/m    GEN: Well nourished, well developed, in no acute distress, morbidly obese HEENT: normal Neck: no JVD or masses Cardiac: RRR; soft murmurs, rubs, or gallops,no edema  Respiratory:  clear to auscultation bilaterally, normal work of breathing GI: soft, nontender, nondistended, + BS MS: no deformity or atrophy Skin: warm and dry, no rash Neuro:  Alert and Oriented x 3, Strength and sensation are intact Psych: euthymic mood, full affect   Wt Readings from Last 3 Encounters:  06/21/18 208 lb 8 oz (94.6 kg)  06/19/18 208 lb (94.3 kg)  06/15/18 207 lb 14.3 oz (94.3 kg)      Studies/Labs Reviewed:   EKG:  EKG is NOT ordered today.    Recent Labs: 02/14/2018: TSH 0.780 05/04/2018: B Natriuretic Peptide 141.8 05/09/2018: Magnesium 1.5 06/15/2018: ALT 20 06/19/2018: BUN 44; Creatinine, Ser 1.68; Hemoglobin 13.5; Platelets 235; Potassium 4.8; Sodium 141   Lipid Panel    Component Value Date/Time   CHOL 192 06/16/2018 0154   CHOL 217 (H) 04/18/2018 0942   CHOL 286 (H) 01/24/2013 1100   TRIG 159 (H) 06/16/2018 0154   TRIG 200 (H) 06/03/2014 1132   TRIG 468 (H) 01/24/2013 1100   HDL  45 06/16/2018 0154   HDL 55 04/18/2018 0942   HDL 43 06/03/2014 1132   HDL 47 01/24/2013 1100   CHOLHDL 4.3 06/16/2018 0154   VLDL 32 06/16/2018 0154   LDLCALC 115 (H) 06/16/2018 0154   LDLCALC 126 (H) 04/18/2018 0942   LDLCALC 101 (H) 06/03/2014 1132   LDLCALC 145 (H) 01/24/2013 1100   LDLDIRECT 132 (H) 05/09/2016 0814   LDLDIRECT 113 (H) 02/26/2013 0845    Additional studies/ records that were reviewed today include:  TAVR OPERATIVE NOTE   Date of Procedure:05/08/2018  Preoperative Diagnosis:Severe Aortic Stenosis  Procedure:   Transcatheter Aortic Valve Replacement - Transfemoral  Approach Edwards Sapien 3 THV (size 7mm, model # [id number], serial [id number])  Co-Surgeons:Christopher Angelena Form, MD and Gaye Pollack, MD   Pre-operative Echo Findings: ? Severe aortic stenosis ? Normalleft ventricular systolic function  Post-operative Echo Findings: ? Noparavalvular leak ? Normalleft ventricular systolic function  _____________   Post operative echo 05/09/18 Study Conclusions - Left ventricle: The cavity size was normal. There was severe concentric hypertrophy. Systolic function was normal. The estimated ejection fraction was in the range of 60% to 65%. The study is not technically sufficient to allow evaluation of LV diastolic function. - Aortic valve: s/p TAVR - Edwards Sapien 3 valve (23 mm). No obstruction with trivial paravalvular leak. Mean gradient (S): 8 mm Hg. Peak gradient (S): 21 mm Hg. Valve area (VTI): 3.07 cm^2. Valve area (Vmax): 2.55 cm^2. Valve area (Vmean): 3.25 cm^2. - Mitral valve: Calcified annulus. Valve area by continuity equation (using LVOT flow): 2.38 cm^2. - Systemic veins: IVC measures <2.1 cm, but does not collapse >50%, suggesting an elevated RA pressure of 8 mmHg. - Pericardium, extracardiac: A moderate sized pericardial effusion was identified posterior to the heart. Features were not consistent with tamponade physiology. Impressions: - LVEF 60-65%, stable echo post TAVR - no obstruction and trivial paravalvular leak. Moderate sized pericardial effusion is noted, mostly posterior.  ______________  Echo 06/21/18 Study Conclusions  - Left ventricle: The cavity size was normal. Wall thickness was   increased in a pattern of moderate LVH. Systolic function was   vigorous. The estimated ejection fraction was in the range of 65%   to 70%. Wall motion was normal; there were no regional wall   motion abnormalities. Features are consistent with a  pseudonormal   left ventricular filling pattern, with concomitant abnormal   relaxation and increased filling pressure (grade 2 diastolic   dysfunction). - Aortic valve: A bioprosthesis was present. Valve area (VTI): 1.28   cm^2. Valve area (Vmax): 1.29 cm^2. Valve area (Vmean): 1.29   cm^2. - Mitral valve: Mildly calcified annulus. Moderately thickened,   severely calcified leaflets . Mobility of the posterior leaflet   was severely restricted. The findings are consistent with trivial   stenosis. Valve area by pressure half-time: 2.44 cm^2. Valve area   by continuity equation (using LVOT flow): 1.42 cm^2. - Pericardium, extracardiac: A small to moderate pericardial   effusion was identified circumferential to the heart. There was   no evidence of hemodynamic compromise.    ASSESSMENT & PLAN:   Severe AS s/p TAVR: echo today shows EF 60%, normally functioning TAVR with no PVL and mean gradient 26mmHg. She has NYHA class I symptoms, but she is not very active. SBE prophylaxis discussed; she has clindamycin. ASA can be discontinued after 6 months of therapy (10/2018)  Chronic diastolic URK:YHCWCBJ euvolemic. Continue current dose of lasix.   HTN:BP remains elevated. Losartan increased  yesterday by PCP. Creat  ~1.6. Would not increase this any further. She is intolerant to amlodipine. Will change Lopressor 25mg  BID to Coreg 25mg  BID for better BP control. She will see Dr Percival Spanish back in 2 weeks.   Permanent atrial fibrillation:maintaining sinus since DCCV in June. Continue Eliquis for thromboembolic prophylaxis.   Chronic kidney disease, stage 3:BMET yesterday by Dr. Laurance Flatten showed creat up to 1.63. This is being follow by PCP  Moderate pericardial effusion: echo today shows she still has small-moderate pericardial effusion with no evidence of hemodynamic compromise.    Medication Adjustments/Labs and Tests Ordered: Current medicines are reviewed at length with the patient  today.  Concerns regarding medicines are outlined above.  Medication changes, Labs and Tests ordered today are listed in the Patient Instructions below. Patient Instructions  Medication Instructions:  1) STOP METOPROLOL 2) START COREG (Carvedilol) 25 mg twice daily 3) STOP ASA AFTER 6 MONTHS 11/05/17  Labwork: None  Testing/Procedures: None  Follow-Up: You have an appointment with Dr. Percival Spanish on Friday, 07/06/2018 at 8:40AM.  You will be called to arrange your one year TAVR echocardiogram and office visit!    Signed, Angelena Form, PA-C  06/21/2018 5:36 PM    [facility] [address], [address], [place]  [address] Phone: [phone]; Fax: [phone]
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
## 2018-06-20 LAB — CBC WITH DIFFERENTIAL/PLATELET
Basophils Absolute: 0.1 10*3/uL (ref 0.0–0.2)
Basos: 1 %
EOS (ABSOLUTE): 0.4 10*3/uL (ref 0.0–0.4)
Eos: 5 %
Hematocrit: 42 % (ref 34.0–46.6)
Hemoglobin: 13.5 g/dL (ref 11.1–15.9)
Immature Grans (Abs): 0 10*3/uL (ref 0.0–0.1)
Immature Granulocytes: 0 %
Lymphocytes Absolute: 2.2 10*3/uL (ref 0.7–3.1)
Lymphs: 28 %
MCH: 26.5 pg — ABNORMAL LOW (ref 26.6–33.0)
MCHC: 32.1 g/dL (ref 31.5–35.7)
MCV: 83 fL (ref 79–97)
Monocytes Absolute: 0.6 10*3/uL (ref 0.1–0.9)
Monocytes: 7 %
Neutrophils Absolute: 4.6 10*3/uL (ref 1.4–7.0)
Neutrophils: 59 %
Platelets: 235 10*3/uL (ref 150–450)
RBC: 5.09 x10E6/uL (ref 3.77–5.28)
RDW: 13.8 % (ref 12.3–15.4)
WBC: 7.8 10*3/uL (ref 3.4–10.8)

## 2018-06-20 LAB — BMP8+EGFR
BUN/Creatinine Ratio: 26 (ref 12–28)
BUN: 44 mg/dL — ABNORMAL HIGH (ref 8–27)
CO2: 25 mmol/L (ref 20–29)
Calcium: 10.1 mg/dL (ref 8.7–10.3)
Chloride: 100 mmol/L (ref 96–106)
Creatinine, Ser: 1.68 mg/dL — ABNORMAL HIGH (ref 0.57–1.00)
GFR calc Af Amer: 36 mL/min/{1.73_m2} — ABNORMAL LOW (ref >59)
GFR calc non Af Amer: 31 mL/min/{1.73_m2} — ABNORMAL LOW (ref >59)
Glucose: 307 mg/dL — ABNORMAL HIGH (ref 65–99)
Potassium: 4.8 mmol/L (ref 3.5–5.2)
Sodium: 141 mmol/L (ref 134–144)

## 2018-06-21 ENCOUNTER — Other Ambulatory Visit: Payer: Self-pay

## 2018-06-21 ENCOUNTER — Encounter: Payer: Self-pay | Admitting: Physician Assistant

## 2018-06-21 ENCOUNTER — Ambulatory Visit (HOSPITAL_COMMUNITY): Payer: Medicare Other | Attending: Cardiovascular Disease

## 2018-06-21 ENCOUNTER — Other Ambulatory Visit: Payer: Self-pay | Admitting: Physician Assistant

## 2018-06-21 ENCOUNTER — Ambulatory Visit (INDEPENDENT_AMBULATORY_CARE_PROVIDER_SITE_OTHER): Payer: Medicare Other | Admitting: Physician Assistant

## 2018-06-21 VITALS — BP 190/62 | HR 56 | Ht 67.0 in | Wt 208.5 lb

## 2018-06-21 DIAGNOSIS — I313 Pericardial effusion (noninflammatory): Secondary | ICD-10-CM | POA: Diagnosis not present

## 2018-06-21 DIAGNOSIS — N189 Chronic kidney disease, unspecified: Secondary | ICD-10-CM | POA: Diagnosis not present

## 2018-06-21 DIAGNOSIS — I4821 Permanent atrial fibrillation: Secondary | ICD-10-CM | POA: Diagnosis not present

## 2018-06-21 DIAGNOSIS — Z952 Presence of prosthetic heart valve: Secondary | ICD-10-CM

## 2018-06-21 DIAGNOSIS — I10 Essential (primary) hypertension: Secondary | ICD-10-CM

## 2018-06-21 DIAGNOSIS — I5032 Chronic diastolic (congestive) heart failure: Secondary | ICD-10-CM | POA: Diagnosis not present

## 2018-06-21 DIAGNOSIS — I3139 Other pericardial effusion (noninflammatory): Secondary | ICD-10-CM

## 2018-06-21 MED ORDER — CARVEDILOL 25 MG PO TABS: 25.0000 mg | ORAL_TABLET | Freq: Two times a day (BID) | ORAL | 11 refills | Status: DC

## 2018-06-21 NOTE — Patient Instructions (Addendum)
Medication Instructions:  1) STOP METOPROLOL 2) START COREG (Carvedilol) 25 mg twice daily  Labwork: None  Testing/Procedures: None  Follow-Up: You have an appointment with Dr. Percival Spanish on Friday, 07/06/2018 at 8:40AM.  You will be called to arrange your one year TAVR echocardiogram and office visit!

## 2018-06-25 ENCOUNTER — Encounter: Payer: Self-pay | Admitting: Thoracic Surgery (Cardiothoracic Vascular Surgery)

## 2018-07-06 ENCOUNTER — Ambulatory Visit: Payer: Medicare Other | Admitting: Cardiology

## 2018-07-06 ENCOUNTER — Encounter: Payer: Self-pay | Admitting: Cardiology

## 2018-07-06 ENCOUNTER — Ambulatory Visit (INDEPENDENT_AMBULATORY_CARE_PROVIDER_SITE_OTHER): Payer: Medicare Other | Admitting: Cardiology

## 2018-07-06 VITALS — BP 140/68 | HR 98 | Ht 67.0 in | Wt 216.8 lb

## 2018-07-06 DIAGNOSIS — I313 Pericardial effusion (noninflammatory): Secondary | ICD-10-CM | POA: Diagnosis not present

## 2018-07-06 DIAGNOSIS — N183 Chronic kidney disease, stage 3 unspecified: Secondary | ICD-10-CM

## 2018-07-06 DIAGNOSIS — Z952 Presence of prosthetic heart valve: Secondary | ICD-10-CM

## 2018-07-06 DIAGNOSIS — I10 Essential (primary) hypertension: Secondary | ICD-10-CM

## 2018-07-06 DIAGNOSIS — C801 Malignant (primary) neoplasm, unspecified: Secondary | ICD-10-CM

## 2018-07-06 DIAGNOSIS — E1122 Type 2 diabetes mellitus with diabetic chronic kidney disease: Secondary | ICD-10-CM

## 2018-07-06 DIAGNOSIS — I3131 Malignant pericardial effusion in diseases classified elsewhere: Secondary | ICD-10-CM

## 2018-07-06 MED ORDER — LOSARTAN POTASSIUM 25 MG PO TABS: 25.0000 mg | ORAL_TABLET | Freq: Two times a day (BID) | ORAL | 1 refills | Status: DC

## 2018-07-06 NOTE — Progress Notes (Signed)
HPI The patient has follow up of AS.   She is status post TAVR.   She comes back today and she is just confused about a lot of things.  She still has a dizziness which caused her hospitalization earlier this month but this is much improved.  She just says that her eyes get blurry.  When she is dizzy she does not want to take her medications even of her blood pressure has been elevated.  It turns out she is actually not been taking her Cozaar perhaps at all or sometimes once a day.  At her last office visit which I reviewed she was switched from metoprolol to Coreg 25 mg twice a day.  This was an increased dose compared to her previous beta-blocker because her blood pressure was still elevated.  She was also called by rehab but was confused about what kind of rehab she was supposed to be having.  She is not having any shortness of breath and actually says that her PND and orthopnea that she was having is much improved.  She has not been as active because of her dizziness and has not again started to rehab.  She is not having any chest pressure, neck or arm discomfort.  Is not describing palpitations, presyncope or syncope.     Allergies  Allergen Reactions  . Atorvastatin Other (See Comments)    Myalgias   . Crestor [Rosuvastatin Calcium] Other (See Comments)    Stiffness and back pain  . Ezetimibe-Simvastatin Other (See Comments)    Leg cramps  . Statins Other (See Comments)    Myalgias  . Celebrex [Celecoxib] Rash  . Levemir [Insulin Detemir] Rash  . Penicillins Other (See Comments)    SYNCOPE (Tolerated Ancef) Has patient had a PCN reaction causing immediate rash, facial/tongue/throat swelling, SOB or lightheadedness with hypotension: Yes Has patient had a PCN reaction causing severe rash involving mucus membranes or skin necrosis: No Has patient had a PCN reaction that required hospitalization No Has patient had a PCN reaction occurring within the last 10 years: No If all of the  above answers are "NO", then may proceed with Cephalosporin use.    Current Outpatient Medications  Medication Sig Dispense Refill  . ACCU-CHEK AVIVA PLUS test strip USE TO CHECK BLOOD SUGAR UP TO TWICE DAILY OR AS INSTRUCTED 100 each 2  . amiodarone (PACERONE) 200 MG tablet Take 1 tablet (200 mg total) by mouth daily. 90 tablet 3  . apixaban (ELIQUIS) 5 MG TABS tablet Take 1 tablet (5 mg total) by mouth 2 (two) times daily. 180 tablet 3  . aspirin 81 MG chewable tablet Chew 1 tablet (81 mg total) by mouth daily.    . B-D INS SYR ULTRAFINE 1CC/31G 31G X 5/16" 1 ML MISC USE TO INJECT INSULIN TWICE A DAY AS INSTRUCTED (Patient taking differently: 1 Stick by Other route 3 (three) times daily before meals. ) 100 each 2  . Calcium Carbonate-Vitamin D (CALCIUM 600+D) 600-400 MG-UNIT per tablet Take 1 tablet by mouth 2 (two) times daily. (Patient taking differently: Take 1 tablet by mouth 3 (three) times a week. )    . carvedilol (COREG) 25 MG tablet Take 1 tablet (25 mg total) by mouth 2 (two) times daily. 60 tablet 11  . clindamycin (CLEOCIN) 300 MG capsule Take 2 tablets (600 mg) one hour prior to dental visits. 6 capsule 3  . furosemide (LASIX) 20 MG tablet Take 20 mg by mouth daily.    Marland Kitchen  Insulin Pen Needle 32G X 4 MM MISC Use to inject insulin with insulin pen 100 each 4  . losartan (COZAAR) 25 MG tablet Take 1 tablet (25 mg total) by mouth 2 (two) times daily. 60 tablet 1  . mupirocin ointment (BACTROBAN) 2 % Apply 1 application topically 2 (two) times daily. 22 g 0  . NOVOLIN 70/30 RELION (70-30) 100 UNIT/ML injection Inject 15-65 Units into the skin See admin instructions. Taking 65 units with breakfast and 15 units at lunch and 65 units with dinner.    . ondansetron (ZOFRAN ODT) 4 MG disintegrating tablet Take 1 tablet (4 mg total) by mouth every 8 (eight) hours as needed for nausea. 10 tablet 0  . Pitavastatin Calcium (LIVALO) 4 MG TABS Take 4 mg by mouth daily.    . Prenatal Vit-Fe  Fumarate-FA (PRENATAL MULTIVITAMIN) TABS tablet Take 1 tablet by mouth 3 (three) times a week.    Marland Kitchen Propylene Glycol (SYSTANE COMPLETE) 0.6 % SOLN Apply 1 drop to eye daily as needed (dry eyes).     . sodium chloride (OCEAN) 0.65 % SOLN nasal spray Place 1 spray into both nostrils as needed for congestion. 15 mL 0  . Triamcinolone Acetonide (TRIAMCINOLONE 0.1 % CREAM : EUCERIN) CREA Apply 1 application topically 2 (two) times daily as needed.  3  . vitamin E 400 UNIT capsule Take 400 Units by mouth 3 (three) times a week.      No current facility-administered medications for this visit.     Past Medical History:  Diagnosis Date  . Atrial fibrillation ([place])    a. maintaining sinus s/p DCCV, on Eliquis  . CAD (coronary artery disease)    a. Nonobstructive by cath 2006 - 25% LAD, 25-30% after 1st diag, distal 25% PDA, minor irreg LCx. b. Normal nuc 2011.  . CKD (chronic kidney disease), stage III ([place])   . Cystocele   . Difficult intubation 09-07-2012   big neck trouble with intubation parotid surgery"takes little med to sedate"  . Esophageal stricture   . GERD (gastroesophageal reflux disease)   . Hiatal hernia    a. 07/2013: HH with small stricture holding up barium tablet (concides with patient's sx of food sticking).  . History of kidney stones   . Hyperlipidemia   . Hypertension   . IBS (irritable bowel syndrome)   . Memory difficulty 11/26/2015  . Morbid obesity ([place])    a. hx of gastric bypass (sleeve gastrectomy 2015)  . Osteoporosis   . 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  Marland Kitchen Pericardial effusion    a. Mod by echo 2012 at time of PNA. b. Echo 07/2012: small pericardial effusion vs fat.  . S/P TAVR (transcatheter aortic valve replacement)   . Severe aortic stenosis   . Sleep apnea     "have mask; don't use it" (02/14/2018)  . TIA (transient ischemic attack) 10/2014; 06/2016   "some memory issues since; daughter says my speech is sometimes different"  (02/14/2018)  . Type II diabetes mellitus ([place])     Past Surgical History:  Procedure Laterality Date  . BREATH TEK H PYLORI N/A 07/29/2013   Procedure: [place];  Surgeon: Shann Medal, MD;  Location: [facility];  Service: General;  Laterality: N/A;  . CARDIAC CATHETERIZATION  2006  . CARDIOVERSION N/A 02/16/2018   Procedure: CARDIOVERSION;  Surgeon: Pixie Casino, MD;  Location: [place];  Service: Cardiovascular;  Laterality: N/A;  . CARPAL TUNNEL  RELEASE Left 2011  . CATARACT EXTRACTION W/ INTRAOCULAR LENS  IMPLANT, BILATERAL Bilateral 2003   left  . CHOLECYSTECTOMY OPEN  1982  . COLONOSCOPY N/A 07/14/2015   Procedure: COLONOSCOPY;  Surgeon: Ladene Artist, MD;  Location: [facility];  Service: Endoscopy;  Laterality: N/A;  . ESOPHAGOGASTRODUODENOSCOPY N/A 12/27/2013   Procedure: ESOPHAGOGASTRODUODENOSCOPY (EGD);  Surgeon: Shann Medal, MD;  Location: [facility];  Service: General;  Laterality: N/A;  . ESOPHAGOGASTRODUODENOSCOPY (EGD) WITH ESOPHAGEAL DILATION    . INTRAOPERATIVE TRANSTHORACIC ECHOCARDIOGRAM  05/08/2018   Procedure: INTRAOPERATIVE TRANSTHORACIC ECHOCARDIOGRAM;  Surgeon: Burnell Blanks, MD;  Location: [place];  Service: Open Heart Surgery;;  . LAPAROSCOPIC GASTRIC SLEEVE RESECTION N/A 01/20/2014   Procedure: LAPAROSCOPIC GASTRIC SLEEVE RESECTION;  Surgeon: Shann Medal, MD;  Location: [facility];  Service: General;  Laterality: N/A;  . PAROTIDECTOMY  09/07/2012   Procedure: PAROTIDECTOMY;  Surgeon: Melida Quitter, MD;  Location: [place];  Service: ENT;  Laterality: Left;  LEFT PAROTIDECTOMY  . RIGHT/LEFT HEART CATH AND CORONARY ANGIOGRAPHY N/A 03/29/2018   Procedure: RIGHT/LEFT HEART CATH AND CORONARY ANGIOGRAPHY;  Surgeon: Burnell Blanks, MD;  Location: [place] [facility];  Service: Cardiovascular;  Laterality: N/A;  . TEE WITHOUT CARDIOVERSION N/A 02/16/2018   Procedure: TRANSESOPHAGEAL ECHOCARDIOGRAM (TEE);  Surgeon: Pixie Casino, MD;   Location: [facility];  Service: Cardiovascular;  Laterality: N/A;  . TOE AMPUTATION Left 07/2017   great toe; [facility]   . TONSILLECTOMY  1968  . TRANSCATHETER AORTIC VALVE REPLACEMENT, TRANSFEMORAL  05/08/2018   Transcatheter Aortic Valve Replacement   . TRANSCATHETER AORTIC VALVE REPLACEMENT, TRANSFEMORAL N/A 05/08/2018   Procedure: TRANSCATHETER AORTIC VALVE REPLACEMENT, TRANSFEMORAL;  Surgeon: Burnell Blanks, MD;  Location: [place];  Service: Open Heart Surgery;  Laterality: N/A;  . TUBAL LIGATION  yrs ago  . UPPER GI ENDOSCOPY  01/20/2014   Procedure: UPPER GI ENDOSCOPY;  Surgeon: Shann Medal, MD;  Location: [facility];  Service: General;;    ROS:  As stated in the HPI and negative for all other systems.    PHYSICAL EXAM BP 140/68   Pulse 98   Ht 5\' 7"  (1.702 m)   Wt 216 lb 12.8 oz (98.3 kg)   BMI 33.96 kg/m   GENERAL:  Well appearing, moon face ease NECK:  No jugular venous distention, waveform within normal limits, carotid upstroke brisk and symmetric, no bruits, no thyromegaly LUNGS:  Clear to auscultation bilaterally CHEST:  Unremarkable HEART:  PMI not displaced or sustained,S1 and S2 within normal limits, no S3, no S4, no clicks, no rubs, 2 out of 6 brief apical systolic murmur radiating slightly at the aortic outflow tract murmurs ABD:  Flat, positive bowel sounds normal in frequency in pitch, no bruits, no rebound, no guarding, no midline pulsatile mass, no hepatomegaly, no splenomegaly, distended EXT:  2 plus pulses decreased DP/PT bilateral, trace edema, no cyanosis no clubbing   EKG:  NA  ASSESSMENT AND PLAN   Severe AS s/p TAVR:    She has done relatively well from this.  She is going to stop her aspirin in March.   Chronic diastolic CHF:   She appears to be euvolemic.  No change in therapy.    HTN:   She is very confused about her medications.  She was not really taking the Cozaar.  Today I am going to restart the Cozaar to 25 mg po BID.  We went over  this with her to avoid confusion.  She will  need to keep her BP diary.   Atrial fibrillation:  She has been maintaining sinus rhythm.  No change in therapy.    Chronic kidney disease, stage 3:  I will check the BMET as above.   Moderate pericardial effusion:  This was mild to moderate on the last echo.  I will follow this clinically.  Echo today shows she still has small-moderate pericardial effusion with no evidence of hemodynamic compromise.  Rehab : She is very confused about rehab.  I see that she was supposed to go to rehab, which sounds like physical therapy.  However, she says she was called by [employer].  She says she was called by [employer].  I am trying to look into the appropriate therapy for her.

## 2018-07-06 NOTE — Patient Instructions (Addendum)
Medication Instructions:  RESTART Cozaar 25mg  Take 1 tablet twice a day If you need a refill on your cardiac medications before your next appointment, please call your pharmacy.   Lab work: Your physician recommends that you return for lab work in: 10 days -BMET If you have labs (blood work) drawn today and your tests are completely normal, you will receive your results only by: Marland Kitchen MyChart Message (if you have MyChart) OR . A paper copy in the mail If you have any lab test that is abnormal or we need to change your treatment, we will call you to review the results.  Testing/Procedures: NONE  Follow-Up: At [facility], you and your health needs are our priority.  As part of our continuing mission to provide you with exceptional heart care, we have created designated Provider Care Teams.  These Care Teams include your primary Cardiologist (physician) and Advanced Practice Providers (APPs -  Physician Assistants and Nurse Practitioners) who all work together to provide you with the care you need, when you need it. . Your physician recommends that you schedule a follow-up appointment in: [facility].  Any Other Special Instructions Will Be Listed Below (If Applicable). CHECK YOUR BLOOD PRESSURE TWICE A DAY

## 2018-07-09 DIAGNOSIS — E1169 Type 2 diabetes mellitus with other specified complication: Secondary | ICD-10-CM | POA: Diagnosis not present

## 2018-07-09 DIAGNOSIS — B351 Tinea unguium: Secondary | ICD-10-CM | POA: Diagnosis not present

## 2018-07-10 DIAGNOSIS — H35033 Hypertensive retinopathy, bilateral: Secondary | ICD-10-CM | POA: Diagnosis not present

## 2018-07-10 DIAGNOSIS — E113293 Type 2 diabetes mellitus with mild nonproliferative diabetic retinopathy without macular edema, bilateral: Secondary | ICD-10-CM | POA: Diagnosis not present

## 2018-07-10 DIAGNOSIS — H04123 Dry eye syndrome of bilateral lacrimal glands: Secondary | ICD-10-CM | POA: Diagnosis not present

## 2018-07-10 DIAGNOSIS — Z961 Presence of intraocular lens: Secondary | ICD-10-CM | POA: Diagnosis not present

## 2018-07-10 DIAGNOSIS — Z794 Long term (current) use of insulin: Secondary | ICD-10-CM | POA: Diagnosis not present

## 2018-07-11 ENCOUNTER — Telehealth: Payer: Self-pay

## 2018-07-11 NOTE — Telephone Encounter (Signed)
I spoke with the pt and made her aware that a referral was sent to [facility] in [place] for outpatient Cardiac Rehabilitation.  The pt did receive a phone call from them about starting but she has not called back at this time.  I provided the pt with the main number to the hospital and instructed her to contact the cardiac rehabilitation department to discuss starting this program.  Pt agreed with plan.

## 2018-07-16 ENCOUNTER — Other Ambulatory Visit: Payer: Medicare Other

## 2018-07-16 DIAGNOSIS — E1122 Type 2 diabetes mellitus with diabetic chronic kidney disease: Secondary | ICD-10-CM | POA: Diagnosis not present

## 2018-07-16 DIAGNOSIS — I10 Essential (primary) hypertension: Secondary | ICD-10-CM | POA: Diagnosis not present

## 2018-07-16 DIAGNOSIS — N183 Chronic kidney disease, stage 3 (moderate): Secondary | ICD-10-CM | POA: Diagnosis not present

## 2018-07-17 LAB — BASIC METABOLIC PANEL
BUN/Creatinine Ratio: 25 (ref 12–28)
BUN: 45 mg/dL — ABNORMAL HIGH (ref 8–27)
CO2: 22 mmol/L (ref 20–29)
Calcium: 8.7 mg/dL (ref 8.7–10.3)
Chloride: 108 mmol/L — ABNORMAL HIGH (ref 96–106)
Creatinine, Ser: 1.78 mg/dL — ABNORMAL HIGH (ref 0.57–1.00)
GFR calc Af Amer: 34 mL/min/{1.73_m2} — ABNORMAL LOW (ref >59)
GFR calc non Af Amer: 29 mL/min/{1.73_m2} — ABNORMAL LOW (ref >59)
Glucose: 230 mg/dL — ABNORMAL HIGH (ref 65–99)
Potassium: 4.7 mmol/L (ref 3.5–5.2)
Sodium: 144 mmol/L (ref 134–144)

## 2018-07-23 ENCOUNTER — Ambulatory Visit: Payer: Medicare Other | Admitting: Cardiology

## 2018-07-26 ENCOUNTER — Encounter: Payer: Self-pay | Admitting: Neurology

## 2018-07-26 ENCOUNTER — Ambulatory Visit (INDEPENDENT_AMBULATORY_CARE_PROVIDER_SITE_OTHER): Payer: Medicare Other | Admitting: Neurology

## 2018-07-26 VITALS — BP 174/53 | HR 58 | Ht 67.0 in | Wt 229.0 lb

## 2018-07-26 DIAGNOSIS — R413 Other amnesia: Secondary | ICD-10-CM

## 2018-07-26 DIAGNOSIS — G459 Transient cerebral ischemic attack, unspecified: Secondary | ICD-10-CM

## 2018-07-26 NOTE — Progress Notes (Signed)
Reason for visit: Mild memory disturbance, recent transient gait disturbance  Referring physician: [name] is a 67 y.o. female  History of present illness:  [name] is a 67 year old right-handed white female with a history of obesity, atrial fibrillation, coronary artery disease, and aortic and mitral valve disease.  The patient had an aortic valve replacement around 08 May 2018.  The patient has done well following this.  She went back into the hospital on 15 June 2018 when her family noted that when she was driving the car she was driving slowly and veering to the left.  The patient was noted to have some gait instability, she had a tendency to lean backwards.  The patient has had episodes of vertigo in the past that she has taken meclizine for.  The patient reported no true vertigo but she did feel woozy in the head and felt off balance.  She denies any nausea or vomiting with this, she did not have a headache.  She reported no focal numbness or weakness of the face, arms, or legs.  She denies any palpitations of the heart or chest pain.  The patient claims that the gait disturbance lasted about 24 hours and then finally cleared.  The patient was admitted to the hospital and underwent MRI of the brain that did not show clear evidence of an acute stroke.  The patient has had a recent carotid Doppler study that was unremarkable.  She is on anticoagulant therapy and aspirin.  She is back to her baseline at this point.  She does note that if she has a sinus issue and blows her nose frequently, she may get bouts of vertigo with this.  The patient is to enter cardiac rehab on 06 August 2018.  She returns to this office for an evaluation.  Past Medical History:  Diagnosis Date  . Atrial fibrillation ([place])    a. maintaining sinus s/p DCCV, on Eliquis  . CAD (coronary artery disease)    a. Nonobstructive by cath 2006 - 25% LAD, 25-30% after 1st diag, distal 25%  PDA, minor irreg LCx. b. Normal nuc 2011.  . CKD (chronic kidney disease), stage III ([place])   . Cystocele   . Difficult intubation 09-07-2012   big neck trouble with intubation parotid surgery"takes little med to sedate"  . Esophageal stricture   . GERD (gastroesophageal reflux disease)   . Hiatal hernia    a. 07/2013: HH with small stricture holding up barium tablet (concides with patient's sx of food sticking).  . History of kidney stones   . Hyperlipidemia   . Hypertension   . IBS (irritable bowel syndrome)   . Memory difficulty 11/26/2015  . Morbid obesity ([place])    a. hx of gastric bypass (sleeve gastrectomy 2015)  . Osteoporosis   . 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  Marland Kitchen Pericardial effusion    a. Mod by echo 2012 at time of PNA. b. Echo 07/2012: small pericardial effusion vs fat.  . S/P TAVR (transcatheter aortic valve replacement)   . Severe aortic stenosis   . Sleep apnea     "have mask; don't use it" (02/14/2018)  . TIA (transient ischemic attack) 10/2014; 06/2016   "some memory issues since; daughter says my speech is sometimes different" (02/14/2018)  . Type II diabetes mellitus ([place])     Past Surgical History:  Procedure Laterality Date  . BREATH TEK H PYLORI N/A 07/29/2013  Procedure: BREATH TEK H PYLORI;  Surgeon: Shann Medal, MD;  Location: [facility];  Service: General;  Laterality: N/A;  . CARDIAC CATHETERIZATION  2006  . CARDIOVERSION N/A 02/16/2018   Procedure: CARDIOVERSION;  Surgeon: Pixie Casino, MD;  Location: [place];  Service: Cardiovascular;  Laterality: N/A;  . CARPAL TUNNEL RELEASE Left 2011  . CATARACT EXTRACTION W/ INTRAOCULAR LENS  IMPLANT, BILATERAL Bilateral 2003   left  . CHOLECYSTECTOMY OPEN  1982  . COLONOSCOPY N/A 07/14/2015   Procedure: COLONOSCOPY;  Surgeon: Ladene Artist, MD;  Location: [facility];  Service: Endoscopy;  Laterality: N/A;  . ESOPHAGOGASTRODUODENOSCOPY N/A 12/27/2013   Procedure:  ESOPHAGOGASTRODUODENOSCOPY (EGD);  Surgeon: Shann Medal, MD;  Location: [facility];  Service: General;  Laterality: N/A;  . ESOPHAGOGASTRODUODENOSCOPY (EGD) WITH ESOPHAGEAL DILATION    . INTRAOPERATIVE TRANSTHORACIC ECHOCARDIOGRAM  05/08/2018   Procedure: INTRAOPERATIVE TRANSTHORACIC ECHOCARDIOGRAM;  Surgeon: Burnell Blanks, MD;  Location: [place];  Service: Open Heart Surgery;;  . LAPAROSCOPIC GASTRIC SLEEVE RESECTION N/A 01/20/2014   Procedure: LAPAROSCOPIC GASTRIC SLEEVE RESECTION;  Surgeon: Shann Medal, MD;  Location: [facility];  Service: General;  Laterality: N/A;  . PAROTIDECTOMY  09/07/2012   Procedure: PAROTIDECTOMY;  Surgeon: Melida Quitter, MD;  Location: [facility];  Service: ENT;  Laterality: Left;  LEFT PAROTIDECTOMY  . RIGHT/LEFT HEART CATH AND CORONARY ANGIOGRAPHY N/A 03/29/2018   Procedure: RIGHT/LEFT HEART CATH AND CORONARY ANGIOGRAPHY;  Surgeon: Burnell Blanks, MD;  Location: [place] [facility];  Service: Cardiovascular;  Laterality: N/A;  . TEE WITHOUT CARDIOVERSION N/A 02/16/2018   Procedure: TRANSESOPHAGEAL ECHOCARDIOGRAM (TEE);  Surgeon: Pixie Casino, MD;  Location: [facility];  Service: Cardiovascular;  Laterality: N/A;  . TOE AMPUTATION Left 07/2017   great toe; [facility]   . TONSILLECTOMY  1968  . TRANSCATHETER AORTIC VALVE REPLACEMENT, TRANSFEMORAL  05/08/2018   Transcatheter Aortic Valve Replacement   . TRANSCATHETER AORTIC VALVE REPLACEMENT, TRANSFEMORAL N/A 05/08/2018   Procedure: TRANSCATHETER AORTIC VALVE REPLACEMENT, TRANSFEMORAL;  Surgeon: Burnell Blanks, MD;  Location: [place];  Service: Open Heart Surgery;  Laterality: N/A;  . TUBAL LIGATION  yrs ago  . UPPER GI ENDOSCOPY  01/20/2014   Procedure: UPPER GI ENDOSCOPY;  Surgeon: Shann Medal, MD;  Location: [facility];  Service: General;;    Family History  Problem Relation Age of Onset  . Heart failure Mother   . Hypertension Mother   . Skin cancer Mother   . Colitis Mother   . Diabetes  Mother   . Kidney Stones Mother   . Stroke Mother   . Heart disease Father   . Colon polyps Father   . Diabetes Father   . Heart failure Father   . Heart attack Sister   . Diabetes Other   . Stroke Maternal Grandfather   . Diabetes Maternal Grandfather   . Colon cancer Neg Hx     Social history:  reports that she has never smoked. She has never used smokeless tobacco. She reports current alcohol use. She reports that she does not use drugs.  Medications:  Prior to Admission medications   Medication Sig Start Date End Date Taking? Authorizing Provider  ACCU-CHEK AVIVA PLUS test strip USE TO CHECK BLOOD SUGAR UP TO TWICE DAILY OR AS INSTRUCTED 09/04/17  Yes Chipper Herb, MD  amiodarone (PACERONE) 200 MG tablet Take 1 tablet (200 mg total) by mouth daily. 03/19/18  Yes Minus Breeding, MD  apixaban (ELIQUIS) 5 MG TABS tablet Take  1 tablet (5 mg total) by mouth 2 (two) times daily. 03/19/18  Yes Minus Breeding, MD  aspirin 81 MG chewable tablet Chew 1 tablet (81 mg total) by mouth daily. 05/10/18  Yes Eileen Stanford, PA-C  B-D INS SYR ULTRAFINE 1CC/31G 31G X 5/16" 1 ML MISC USE TO INJECT INSULIN TWICE A DAY AS INSTRUCTED Patient taking differently: 1 Stick by Other route 3 (three) times daily before meals.  11/16/15  Yes Chipper Herb, MD  Calcium Carbonate-Vitamin D (CALCIUM 600+D) 600-400 MG-UNIT per tablet Take 1 tablet by mouth 2 (two) times daily. Patient taking differently: Take 1 tablet by mouth 3 (three) times a week.  05/06/15  Yes Cherre Robins, PharmD  carvedilol (COREG) 25 MG tablet Take 1 tablet (25 mg total) by mouth 2 (two) times daily. 06/21/18 06/16/19 Yes Eileen Stanford, PA-C  clindamycin (CLEOCIN) 300 MG capsule Take 2 tablets (600 mg) one hour prior to dental visits. 05/24/18  Yes Eileen Stanford, PA-C  furosemide (LASIX) 20 MG tablet Take 20 mg by mouth daily.   Yes [provider]  Insulin Pen Needle 32G X 4 MM MISC Use to inject insulin with insulin  pen 05/17/16  Yes Chipper Herb, MD  losartan (COZAAR) 25 MG tablet Take 1 tablet (25 mg total) by mouth 2 (two) times daily. 07/06/18  Yes Minus Breeding, MD  mupirocin ointment (BACTROBAN) 2 % Apply 1 application topically 2 (two) times daily. 06/19/18  Yes Chipper Herb, MD  NOVOLIN 70/30 RELION (70-30) 100 UNIT/ML injection Inject 15-65 Units into the skin See admin instructions. Taking 65 units with breakfast and 15 units at lunch and 65 units with dinner. 07/13/16  Yes [provider]  ondansetron (ZOFRAN ODT) 4 MG disintegrating tablet Take 1 tablet (4 mg total) by mouth every 8 (eight) hours as needed for nausea. 01/03/17  Yes Larene Pickett, PA-C  Pitavastatin Calcium (LIVALO) 4 MG TABS Take 4 mg by mouth daily.   Yes [provider]  Prenatal Vit-Fe Fumarate-FA (PRENATAL MULTIVITAMIN) TABS tablet Take 1 tablet by mouth 3 (three) times a week.   Yes [provider]  Propylene Glycol (SYSTANE COMPLETE) 0.6 % SOLN Apply 1 drop to eye daily as needed (dry eyes).    Yes [provider]  sodium chloride (OCEAN) 0.65 % SOLN nasal spray Place 1 spray into both nostrils as needed for congestion. 11/09/14  Yes Domenic Polite, MD  Triamcinolone Acetonide (TRIAMCINOLONE 0.1 % CREAM : EUCERIN) CREA Apply 1 application topically 2 (two) times daily as needed. 04/06/18  Yes [provider]  vitamin E 400 UNIT capsule Take 400 Units by mouth 3 (three) times a week.    Yes [provider]  Olmesartan-Amlodipine-HCTZ (TRIBENZOR) 40-5-12.5 MG TABS Take 1 tablet by mouth daily.    04/12/18  [provider]      Allergies  Allergen Reactions  . Atorvastatin Other (See Comments)    Myalgias   . Crestor [Rosuvastatin Calcium] Other (See Comments)    Stiffness and back pain  . Ezetimibe-Simvastatin Other (See Comments)    Leg cramps  . Statins Other (See Comments)    Myalgias  . Celebrex [Celecoxib] Rash  . Levemir [Insulin Detemir] Rash    . Penicillins Other (See Comments)    SYNCOPE (Tolerated Ancef) Has patient had a PCN reaction causing immediate rash, facial/tongue/throat swelling, SOB or lightheadedness with hypotension: Yes Has patient had a PCN reaction causing severe rash involving mucus membranes or skin  necrosis: No Has patient had a PCN reaction that required hospitalization No Has patient had a PCN reaction occurring within the last 10 years: No If all of the above answers are "NO", then may proceed with Cephalosporin use.    ROS:  Out of a complete 14 system review of symptoms, the patient complains only of the following symptoms, and all other reviewed systems are negative.  Fatigue Hearing loss, ringing in the ears Eye itching Leg swelling Swollen abdomen, diarrhea Moles Memory loss, dizziness, weakness  Blood pressure (!) 174/53, pulse (!) 58, height 5\' 7"  (1.702 m), weight 229 lb (103.9 kg).  Physical Exam  General: The patient is alert and cooperative at the time of the examination.  The patient is markedly obese, central obesity.  Eyes: Pupils are equal, round, and reactive to light. Discs are flat bilaterally.  Neck: The neck is supple, no carotid bruits are noted.  Respiratory: The respiratory examination is clear.  Cardiovascular: The cardiovascular examination reveals a regular rate and rhythm, no obvious murmurs or rubs are noted.  Skin: Extremities are with 1+ edema at the ankles bilaterally.  Neurologic Exam  Mental status: The patient is alert and oriented x 3 at the time of the examination. The patient has apparent normal recent and remote memory, with an apparently normal attention span and concentration ability.  Mini-Mental status examination done today shows a total score of 30/30.  Cranial nerves: Facial symmetry is present. There is good sensation of the face to pinprick and soft touch bilaterally. The strength of the facial muscles and the muscles to head turning and  shoulder shrug are normal bilaterally. Speech is well enunciated, no aphasia or dysarthria is noted. Extraocular movements are full. Visual fields are full. The tongue is midline, and the patient has symmetric elevation of the soft palate. No obvious hearing deficits are noted.  Motor: The motor testing reveals 5 over 5 strength of all 4 extremities. Good symmetric motor tone is noted throughout.  Sensory: Sensory testing is intact to pinprick, soft touch, vibration sensation, and position sense on all 4 extremities, with exception of some decrease in vibration sensation in the feet bilaterally, a possible stocking pattern pinprick sensory deficit up to the knees bilaterally. No evidence of extinction is noted.  Coordination: Cerebellar testing reveals good finger-nose-finger and heel-to-shin bilaterally.  Gait and station: Gait is slightly wide-based, the patient is able to walk well independently. Romberg is negative. No drift is seen.  Reflexes: Deep tendon reflexes are symmetric, but are depressed bilaterally.   MRI brain 06/16/18:  IMPRESSION: 1. 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 2. Mild chronic small vessel ischemic changes. Old small RIGHT cerebellar infarct.  * MRI scan images were reviewed online. I agree with the written report.    Assessment/Plan:  1.  Recent transient episode of gait instability  2.  History of vertigo in the past  3.  Mild memory disturbance, mild cognitive impairment  The patient continues to do well with her memory, the memory has been quite stable over a number of years.  The patient does have a history of episodic vertigo likely related to inner ear issues, the recent episode could have been related to this.  The patient underwent a cerebrovascular work-up that did not show clear evidence of an acute stroke.  The patient remains on anticoagulation and antiplatelet medications.  The patient will continue this therapy,  she will be entering into cardiac rehab in the near future.  Bonner Puna  Jannifer Franklin MD 07/26/2018 10:45 AM  [facility] [address] [place], [place] [address]  Phone [phone] Fax [phone]

## 2018-07-30 ENCOUNTER — Other Ambulatory Visit: Payer: Self-pay | Admitting: Cardiology

## 2018-08-10 ENCOUNTER — Telehealth: Payer: Self-pay | Admitting: Cardiology

## 2018-08-10 NOTE — Telephone Encounter (Signed)
Spoke with [name], the patient is currently at rehab and her bp elevated. Her 1st day at rehab it was 150/82. The patient just feels tired today otherwise she feels fine. Confirmed the patient is taking all of her medications as directed. The patient will check her bp once daily and bring it to her follow up appointment. They will also continue to monitor patient at rehab and let us know of changes. She will not be allowed to exercise today due to the elevated blood pressure.

## 2018-08-10 NOTE — Telephone Encounter (Signed)
New message   Pt c/o BP issue: STAT if pt c/o blurred vision, one-sided weakness or slurred speech  1. What are your last 5 BP readings? 208/89  206/87 bp on 08/10/2018  2. Are you having any other symptoms (ex. Dizziness, headache, blurred vision, passed out)?tired  3. What is your BP issue? bp is elevated

## 2018-08-13 DIAGNOSIS — R269 Unspecified abnormalities of gait and mobility: Secondary | ICD-10-CM | POA: Diagnosis not present

## 2018-08-13 DIAGNOSIS — K219 Gastro-esophageal reflux disease without esophagitis: Secondary | ICD-10-CM | POA: Diagnosis not present

## 2018-08-13 DIAGNOSIS — I129 Hypertensive chronic kidney disease with stage 1 through stage 4 chronic kidney disease, or unspecified chronic kidney disease: Secondary | ICD-10-CM | POA: Diagnosis not present

## 2018-08-13 DIAGNOSIS — Z8249 Family history of ischemic heart disease and other diseases of the circulatory system: Secondary | ICD-10-CM | POA: Diagnosis not present

## 2018-08-13 DIAGNOSIS — N183 Chronic kidney disease, stage 3 (moderate): Secondary | ICD-10-CM | POA: Diagnosis not present

## 2018-08-13 DIAGNOSIS — R0602 Shortness of breath: Secondary | ICD-10-CM | POA: Diagnosis not present

## 2018-08-13 DIAGNOSIS — Z7984 Long term (current) use of oral hypoglycemic drugs: Secondary | ICD-10-CM | POA: Diagnosis not present

## 2018-08-13 DIAGNOSIS — Z880 Allergy status to penicillin: Secondary | ICD-10-CM | POA: Diagnosis not present

## 2018-08-13 DIAGNOSIS — G4733 Obstructive sleep apnea (adult) (pediatric): Secondary | ICD-10-CM | POA: Diagnosis not present

## 2018-08-13 DIAGNOSIS — M25551 Pain in right hip: Secondary | ICD-10-CM | POA: Diagnosis not present

## 2018-08-13 DIAGNOSIS — R202 Paresthesia of skin: Secondary | ICD-10-CM | POA: Diagnosis not present

## 2018-08-13 DIAGNOSIS — Z954 Presence of other heart-valve replacement: Secondary | ICD-10-CM | POA: Diagnosis not present

## 2018-08-13 DIAGNOSIS — M25552 Pain in left hip: Secondary | ICD-10-CM | POA: Diagnosis not present

## 2018-08-13 DIAGNOSIS — M25561 Pain in right knee: Secondary | ICD-10-CM | POA: Diagnosis not present

## 2018-08-13 DIAGNOSIS — R42 Dizziness and giddiness: Secondary | ICD-10-CM | POA: Diagnosis not present

## 2018-08-13 DIAGNOSIS — Z7901 Long term (current) use of anticoagulants: Secondary | ICD-10-CM | POA: Diagnosis not present

## 2018-08-13 DIAGNOSIS — R200 Anesthesia of skin: Secondary | ICD-10-CM | POA: Diagnosis not present

## 2018-08-13 DIAGNOSIS — E1122 Type 2 diabetes mellitus with diabetic chronic kidney disease: Secondary | ICD-10-CM | POA: Diagnosis not present

## 2018-08-13 DIAGNOSIS — Z7982 Long term (current) use of aspirin: Secondary | ICD-10-CM | POA: Diagnosis not present

## 2018-08-13 DIAGNOSIS — M1990 Unspecified osteoarthritis, unspecified site: Secondary | ICD-10-CM | POA: Diagnosis not present

## 2018-08-13 DIAGNOSIS — R197 Diarrhea, unspecified: Secondary | ICD-10-CM | POA: Diagnosis not present

## 2018-08-13 DIAGNOSIS — Z888 Allergy status to other drugs, medicaments and biological substances status: Secondary | ICD-10-CM | POA: Diagnosis not present

## 2018-08-13 DIAGNOSIS — R5383 Other fatigue: Secondary | ICD-10-CM | POA: Diagnosis not present

## 2018-08-13 DIAGNOSIS — M25562 Pain in left knee: Secondary | ICD-10-CM | POA: Diagnosis not present

## 2018-08-14 DIAGNOSIS — Z954 Presence of other heart-valve replacement: Secondary | ICD-10-CM | POA: Diagnosis not present

## 2018-08-14 DIAGNOSIS — E1122 Type 2 diabetes mellitus with diabetic chronic kidney disease: Secondary | ICD-10-CM | POA: Diagnosis not present

## 2018-08-14 DIAGNOSIS — K219 Gastro-esophageal reflux disease without esophagitis: Secondary | ICD-10-CM | POA: Diagnosis not present

## 2018-08-14 DIAGNOSIS — N183 Chronic kidney disease, stage 3 (moderate): Secondary | ICD-10-CM | POA: Diagnosis not present

## 2018-08-14 DIAGNOSIS — I129 Hypertensive chronic kidney disease with stage 1 through stage 4 chronic kidney disease, or unspecified chronic kidney disease: Secondary | ICD-10-CM | POA: Diagnosis not present

## 2018-08-14 DIAGNOSIS — R269 Unspecified abnormalities of gait and mobility: Secondary | ICD-10-CM | POA: Diagnosis not present

## 2018-08-20 ENCOUNTER — Encounter (HOSPITAL_COMMUNITY): Payer: Self-pay

## 2018-08-20 DIAGNOSIS — Z954 Presence of other heart-valve replacement: Secondary | ICD-10-CM | POA: Diagnosis not present

## 2018-08-20 DIAGNOSIS — K219 Gastro-esophageal reflux disease without esophagitis: Secondary | ICD-10-CM | POA: Diagnosis not present

## 2018-08-20 DIAGNOSIS — M25562 Pain in left knee: Secondary | ICD-10-CM | POA: Diagnosis not present

## 2018-08-20 DIAGNOSIS — N183 Chronic kidney disease, stage 3 (moderate): Secondary | ICD-10-CM | POA: Diagnosis not present

## 2018-08-20 DIAGNOSIS — R42 Dizziness and giddiness: Secondary | ICD-10-CM | POA: Diagnosis not present

## 2018-08-20 DIAGNOSIS — R200 Anesthesia of skin: Secondary | ICD-10-CM | POA: Diagnosis not present

## 2018-08-20 DIAGNOSIS — R269 Unspecified abnormalities of gait and mobility: Secondary | ICD-10-CM | POA: Diagnosis not present

## 2018-08-20 DIAGNOSIS — R197 Diarrhea, unspecified: Secondary | ICD-10-CM | POA: Diagnosis not present

## 2018-08-20 DIAGNOSIS — R5383 Other fatigue: Secondary | ICD-10-CM | POA: Diagnosis not present

## 2018-08-20 DIAGNOSIS — I129 Hypertensive chronic kidney disease with stage 1 through stage 4 chronic kidney disease, or unspecified chronic kidney disease: Secondary | ICD-10-CM | POA: Diagnosis not present

## 2018-08-20 DIAGNOSIS — Z7901 Long term (current) use of anticoagulants: Secondary | ICD-10-CM | POA: Diagnosis not present

## 2018-08-20 DIAGNOSIS — Z888 Allergy status to other drugs, medicaments and biological substances status: Secondary | ICD-10-CM | POA: Diagnosis not present

## 2018-08-20 DIAGNOSIS — M1990 Unspecified osteoarthritis, unspecified site: Secondary | ICD-10-CM | POA: Diagnosis not present

## 2018-08-20 DIAGNOSIS — R202 Paresthesia of skin: Secondary | ICD-10-CM | POA: Diagnosis not present

## 2018-08-20 DIAGNOSIS — Z7982 Long term (current) use of aspirin: Secondary | ICD-10-CM | POA: Diagnosis not present

## 2018-08-20 DIAGNOSIS — Z7984 Long term (current) use of oral hypoglycemic drugs: Secondary | ICD-10-CM | POA: Diagnosis not present

## 2018-08-20 DIAGNOSIS — M25551 Pain in right hip: Secondary | ICD-10-CM | POA: Diagnosis not present

## 2018-08-20 DIAGNOSIS — M25552 Pain in left hip: Secondary | ICD-10-CM | POA: Diagnosis not present

## 2018-08-20 DIAGNOSIS — M25561 Pain in right knee: Secondary | ICD-10-CM | POA: Diagnosis not present

## 2018-08-20 DIAGNOSIS — R0602 Shortness of breath: Secondary | ICD-10-CM | POA: Diagnosis not present

## 2018-08-20 DIAGNOSIS — G4733 Obstructive sleep apnea (adult) (pediatric): Secondary | ICD-10-CM | POA: Diagnosis not present

## 2018-08-20 DIAGNOSIS — E1122 Type 2 diabetes mellitus with diabetic chronic kidney disease: Secondary | ICD-10-CM | POA: Diagnosis not present

## 2018-08-20 DIAGNOSIS — Z8249 Family history of ischemic heart disease and other diseases of the circulatory system: Secondary | ICD-10-CM | POA: Diagnosis not present

## 2018-08-20 DIAGNOSIS — Z880 Allergy status to penicillin: Secondary | ICD-10-CM | POA: Diagnosis not present

## 2018-08-20 NOTE — Progress Notes (Signed)
HPI The patient has follow up of AS.   She is status post TAVR.   We had to clarify her meds at the last appt.  Since then her BPs have been somewhat elevated.  She returns for follow up.  She is in cardiac rehab.  She says this is going slowly.  She gets short of breath and she is fatigued but she is getting keep trying.  Today she was clear on her medications.  She brings her blood pressures and her systolics are not at all controlled ranging from the 160s to the 220s.  Diastolics are in the 44W and 60s.  Heart rate in the 50s and 60s.  He is not had any presyncope or syncope.  She is not had any new chest pressure, neck or arm discomfort.  Said no weight gain or edema.   Allergies  Allergen Reactions  . Amlodipine   . Atorvastatin Other (See Comments)    Myalgias   . Crestor [Rosuvastatin Calcium] Other (See Comments)    Stiffness and back pain  . Ezetimibe-Simvastatin Other (See Comments)    Leg cramps  . Statins Other (See Comments)    Myalgias  . Celebrex [Celecoxib] Rash  . Levemir [Insulin Detemir] Rash  . Penicillins Other (See Comments)    SYNCOPE (Tolerated Ancef) Has patient had a PCN reaction causing immediate rash, facial/tongue/throat swelling, SOB or lightheadedness with hypotension: Yes Has patient had a PCN reaction causing severe rash involving mucus membranes or skin necrosis: No Has patient had a PCN reaction that required hospitalization No Has patient had a PCN reaction occurring within the last 10 years: No If all of the above answers are "NO", then may proceed with Cephalosporin use.    Current Outpatient Medications  Medication Sig Dispense Refill  . amiodarone (PACERONE) 200 MG tablet Take 1 tablet (200 mg total) by mouth daily. 90 tablet 3  . apixaban (ELIQUIS) 5 MG TABS tablet Take 1 tablet (5 mg total) by mouth 2 (two) times daily. 180 tablet 3  . Calcium Carbonate-Vitamin D (CALCIUM 600+D) 600-400 MG-UNIT per tablet Take 1 tablet by mouth 2 (two)  times daily. (Patient taking differently: Take 1 tablet by mouth 3 (three) times a week. )    . carvedilol (COREG) 25 MG tablet Take 1 tablet (25 mg total) by mouth 2 (two) times daily. 60 tablet 11  . furosemide (LASIX) 20 MG tablet Take 20 mg by mouth daily.    . meclizine (ANTIVERT) 12.5 MG tablet Take 12.5 mg by mouth 3 (three) times daily as needed for dizziness.    . mupirocin ointment (BACTROBAN) 2 % Apply 1 application topically 2 (two) times daily. 22 g 0  . NOVOLIN 70/30 RELION (70-30) 100 UNIT/ML injection Inject 15-65 Units into the skin See admin instructions. Taking 65 units with breakfast and 15 units at lunch and 65 units with dinner.    . ondansetron (ZOFRAN ODT) 4 MG disintegrating tablet Take 1 tablet (4 mg total) by mouth every 8 (eight) hours as needed for nausea. 10 tablet 0  . Pitavastatin Calcium (LIVALO) 4 MG TABS Take 4 mg by mouth daily.    . Prenatal Vit-Fe Fumarate-FA (PRENATAL MULTIVITAMIN) TABS tablet Take 1 tablet by mouth 3 (three) times a week.    Marland Kitchen Propylene Glycol (SYSTANE COMPLETE) 0.6 % SOLN Apply 1 drop to eye daily as needed (dry eyes).     . sodium chloride (OCEAN) 0.65 % SOLN nasal spray Place 1 spray  into both nostrils as needed for congestion. 15 mL 0  . vitamin E 400 UNIT capsule Take 400 Units by mouth 3 (three) times a week.     Marland Kitchen ACCU-CHEK AVIVA PLUS test strip USE TO CHECK BLOOD SUGAR UP TO TWICE DAILY OR AS INSTRUCTED 100 each 2  . B-D INS SYR ULTRAFINE 1CC/31G 31G X 5/16" 1 ML MISC USE TO INJECT INSULIN TWICE A DAY AS INSTRUCTED (Patient taking differently: 1 Stick by Other route 3 (three) times daily before meals. ) 100 each 2  . clindamycin (CLEOCIN) 300 MG capsule Take 2 tablets (600 mg) one hour prior to dental visits. 6 capsule 3  . hydrALAZINE (APRESOLINE) 10 MG tablet Take 1 tablet (10 mg total) by mouth 3 (three) times daily. 270 tablet 3  . Insulin Pen Needle 32G X 4 MM MISC Use to inject insulin with insulin pen 100 each 4  . losartan  (COZAAR) 50 MG tablet Take 1 tablet (50 mg total) by mouth 2 (two) times daily. 180 tablet 3   No current facility-administered medications for this visit.     Past Medical History:  Diagnosis Date  . Atrial fibrillation ([place])    a. maintaining sinus s/p DCCV, on Eliquis  . CAD (coronary artery disease)    a. Nonobstructive by cath 2006 - 25% LAD, 25-30% after 1st diag, distal 25% PDA, minor irreg LCx. b. Normal nuc 2011.  . CKD (chronic kidney disease), stage III ([place])   . Cystocele   . Difficult intubation 09-07-2012   big neck trouble with intubation parotid surgery"takes little med to sedate"  . Esophageal stricture   . GERD (gastroesophageal reflux disease)   . Hiatal hernia    a. 07/2013: HH with small stricture holding up barium tablet (concides with patient's sx of food sticking).  . History of kidney stones   . Hyperlipidemia   . Hypertension   . IBS (irritable bowel syndrome)   . Memory difficulty 11/26/2015  . Morbid obesity ([place])    a. hx of gastric bypass (sleeve gastrectomy 2015)  . Osteoporosis   . 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  Marland Kitchen Pericardial effusion    a. Mod by echo 2012 at time of PNA. b. Echo 07/2012: small pericardial effusion vs fat.  . S/P TAVR (transcatheter aortic valve replacement)   . Severe aortic stenosis   . Sleep apnea     "have mask; don't use it" (02/14/2018)  . TIA (transient ischemic attack) 10/2014; 06/2016   "some memory issues since; daughter says my speech is sometimes different" (02/14/2018)  . Type II diabetes mellitus ([place])     Past Surgical History:  Procedure Laterality Date  . BREATH TEK H PYLORI N/A 07/29/2013   Procedure: [place];  Surgeon: Shann Medal, MD;  Location: [facility];  Service: General;  Laterality: N/A;  . CARDIAC CATHETERIZATION  2006  . CARDIOVERSION N/A 02/16/2018   Procedure: CARDIOVERSION;  Surgeon: Pixie Casino, MD;  Location: [place];  Service: Cardiovascular;   Laterality: N/A;  . CARPAL TUNNEL RELEASE Left 2011  . CATARACT EXTRACTION W/ INTRAOCULAR LENS  IMPLANT, BILATERAL Bilateral 2003   left  . CHOLECYSTECTOMY OPEN  1982  . COLONOSCOPY N/A 07/14/2015   Procedure: COLONOSCOPY;  Surgeon: Ladene Artist, MD;  Location: [facility];  Service: Endoscopy;  Laterality: N/A;  . ESOPHAGOGASTRODUODENOSCOPY N/A 12/27/2013   Procedure: ESOPHAGOGASTRODUODENOSCOPY (EGD);  Surgeon: Shann Medal, MD;  Location: [facility];  Service: General;  Laterality: N/A;  . ESOPHAGOGASTRODUODENOSCOPY (EGD) WITH ESOPHAGEAL DILATION    . INTRAOPERATIVE TRANSTHORACIC ECHOCARDIOGRAM  05/08/2018   Procedure: INTRAOPERATIVE TRANSTHORACIC ECHOCARDIOGRAM;  Surgeon: Burnell Blanks, MD;  Location: [place];  Service: Open Heart Surgery;;  . LAPAROSCOPIC GASTRIC SLEEVE RESECTION N/A 01/20/2014   Procedure: LAPAROSCOPIC GASTRIC SLEEVE RESECTION;  Surgeon: Shann Medal, MD;  Location: [facility];  Service: General;  Laterality: N/A;  . PAROTIDECTOMY  09/07/2012   Procedure: PAROTIDECTOMY;  Surgeon: Melida Quitter, MD;  Location: [facility];  Service: ENT;  Laterality: Left;  LEFT PAROTIDECTOMY  . RIGHT/LEFT HEART CATH AND CORONARY ANGIOGRAPHY N/A 03/29/2018   Procedure: RIGHT/LEFT HEART CATH AND CORONARY ANGIOGRAPHY;  Surgeon: Burnell Blanks, MD;  Location: [facility];  Service: Cardiovascular;  Laterality: N/A;  . TEE WITHOUT CARDIOVERSION N/A 02/16/2018   Procedure: TRANSESOPHAGEAL ECHOCARDIOGRAM (TEE);  Surgeon: Pixie Casino, MD;  Location: [facility];  Service: Cardiovascular;  Laterality: N/A;  . TOE AMPUTATION Left 07/2017   great toe; [facility]   . TONSILLECTOMY  1968  . TRANSCATHETER AORTIC VALVE REPLACEMENT, TRANSFEMORAL  05/08/2018   Transcatheter Aortic Valve Replacement   . TRANSCATHETER AORTIC VALVE REPLACEMENT, TRANSFEMORAL N/A 05/08/2018   Procedure: TRANSCATHETER AORTIC VALVE REPLACEMENT, TRANSFEMORAL;  Surgeon: Burnell Blanks, MD;  Location: [place];  Service: Open Heart Surgery;  Laterality: N/A;  . TUBAL LIGATION  yrs ago  . UPPER GI ENDOSCOPY  01/20/2014   Procedure: UPPER GI ENDOSCOPY;  Surgeon: Shann Medal, MD;  Location: [facility];  Service: General;;    ROS:  Dizziness .     Otherwise, as stated in the HPI and negative for all other systems.    PHYSICAL EXAM BP (!) 150/62   Pulse (!) 52   Ht 5\' 7"  (1.702 m)   Wt 229 lb (103.9 kg)   BMI 35.87 kg/m   GENERAL:  Well appearing NECK:  No jugular venous distention, waveform within normal limits, carotid upstroke brisk and symmetric, no bruits, no thyromegaly LUNGS:  Clear to auscultation bilaterally CHEST:  Unremarkable HEART:  PMI not displaced or sustained,S1 and S2 within normal limits, no S3, no S4, no clicks, no rubs, 2 out of 6 systolic murmur brief and radiating slightly at the aortic outflow tract, no diastolic murmurs ABD:  Flat, positive bowel sounds normal in frequency in pitch, no bruits, no rebound, no guarding, no midline pulsatile mass, no hepatomegaly, no splenomegaly EXT:  2 plus pulses throughout, no edema, no cyanosis no clubbing   EKG:   NA  ASSESSMENT AND PLAN   Severe AS s/p TAVR:    I think she is done well with this.  She is continue with cardiac rehab.  No change in therapy.  Chronic diastolic CHF:   She appears to be euvolemic.  She will continue with the meds as listed.  I will likely check kidney function again when I see her back and she does have some mild renal insufficiency.  HTN:   She was very confused about her medications.  She does seem to be straight on her medications now.  I am getting increase the Cozaar to 50 mg twice daily.  She was taking 50 mg in the morning and 20 5 in the evening.  Her most can add hydralazine 10 mg 3 times daily.  I suggested Norvasc but she said she had horrible swelling with this in the past.   Atrial fibrillation:   She seems to be maintaining sinus  rhythm.  I will continue the amiodarone for now.  She  can stop her aspirin.  Chronic kidney disease, stage 3:  I will check this as above.  Moderate pericardial effusion:  Will follow this up with an echocardiogram probably in 6 months.   Rehab :  She is now participating in rehab.

## 2018-08-22 ENCOUNTER — Encounter: Payer: Self-pay | Admitting: Cardiology

## 2018-08-22 ENCOUNTER — Ambulatory Visit (INDEPENDENT_AMBULATORY_CARE_PROVIDER_SITE_OTHER): Payer: Medicare Other | Admitting: Cardiology

## 2018-08-22 VITALS — BP 150/62 | HR 52 | Ht 67.0 in | Wt 229.0 lb

## 2018-08-22 DIAGNOSIS — I313 Pericardial effusion (noninflammatory): Secondary | ICD-10-CM

## 2018-08-22 DIAGNOSIS — N189 Chronic kidney disease, unspecified: Secondary | ICD-10-CM

## 2018-08-22 DIAGNOSIS — E1122 Type 2 diabetes mellitus with diabetic chronic kidney disease: Secondary | ICD-10-CM | POA: Diagnosis not present

## 2018-08-22 DIAGNOSIS — I129 Hypertensive chronic kidney disease with stage 1 through stage 4 chronic kidney disease, or unspecified chronic kidney disease: Secondary | ICD-10-CM | POA: Diagnosis not present

## 2018-08-22 DIAGNOSIS — R269 Unspecified abnormalities of gait and mobility: Secondary | ICD-10-CM | POA: Diagnosis not present

## 2018-08-22 DIAGNOSIS — I5032 Chronic diastolic (congestive) heart failure: Secondary | ICD-10-CM

## 2018-08-22 DIAGNOSIS — N183 Chronic kidney disease, stage 3 (moderate): Secondary | ICD-10-CM | POA: Diagnosis not present

## 2018-08-22 DIAGNOSIS — Z952 Presence of prosthetic heart valve: Secondary | ICD-10-CM | POA: Diagnosis not present

## 2018-08-22 DIAGNOSIS — I10 Essential (primary) hypertension: Secondary | ICD-10-CM

## 2018-08-22 DIAGNOSIS — K219 Gastro-esophageal reflux disease without esophagitis: Secondary | ICD-10-CM | POA: Diagnosis not present

## 2018-08-22 DIAGNOSIS — Z954 Presence of other heart-valve replacement: Secondary | ICD-10-CM | POA: Diagnosis not present

## 2018-08-22 DIAGNOSIS — I3139 Other pericardial effusion (noninflammatory): Secondary | ICD-10-CM

## 2018-08-22 MED ORDER — LOSARTAN POTASSIUM 50 MG PO TABS: 50.0000 mg | ORAL_TABLET | Freq: Two times a day (BID) | ORAL | 3 refills | Status: DC

## 2018-08-22 MED ORDER — HYDRALAZINE HCL 10 MG PO TABS: 10.0000 mg | ORAL_TABLET | Freq: Three times a day (TID) | ORAL | 3 refills | Status: DC

## 2018-08-22 NOTE — Patient Instructions (Signed)
Medication Instructions:  Please increase your Losartan to 50 mg twice a day. Take Hydralazine 10 mg three times a day. Continue all other medications as listed.  If you need a refill on your cardiac medications before your next appointment, please call your pharmacy.   Follow-Up: Follow up in 1 month with Dr Percival Spanish in [place].  Thank you for choosing [facility]!!

## 2018-08-24 DIAGNOSIS — R269 Unspecified abnormalities of gait and mobility: Secondary | ICD-10-CM | POA: Diagnosis not present

## 2018-08-24 DIAGNOSIS — Z954 Presence of other heart-valve replacement: Secondary | ICD-10-CM | POA: Diagnosis not present

## 2018-08-24 DIAGNOSIS — I129 Hypertensive chronic kidney disease with stage 1 through stage 4 chronic kidney disease, or unspecified chronic kidney disease: Secondary | ICD-10-CM | POA: Diagnosis not present

## 2018-08-24 DIAGNOSIS — E1122 Type 2 diabetes mellitus with diabetic chronic kidney disease: Secondary | ICD-10-CM | POA: Diagnosis not present

## 2018-08-24 DIAGNOSIS — K219 Gastro-esophageal reflux disease without esophagitis: Secondary | ICD-10-CM | POA: Diagnosis not present

## 2018-08-24 DIAGNOSIS — N183 Chronic kidney disease, stage 3 (moderate): Secondary | ICD-10-CM | POA: Diagnosis not present

## 2018-08-27 ENCOUNTER — Telehealth: Payer: Self-pay

## 2018-08-27 ENCOUNTER — Telehealth: Payer: Self-pay | Admitting: Cardiology

## 2018-08-27 DIAGNOSIS — E1122 Type 2 diabetes mellitus with diabetic chronic kidney disease: Secondary | ICD-10-CM | POA: Diagnosis not present

## 2018-08-27 DIAGNOSIS — I129 Hypertensive chronic kidney disease with stage 1 through stage 4 chronic kidney disease, or unspecified chronic kidney disease: Secondary | ICD-10-CM | POA: Diagnosis not present

## 2018-08-27 DIAGNOSIS — Z79899 Other long term (current) drug therapy: Secondary | ICD-10-CM

## 2018-08-27 DIAGNOSIS — R269 Unspecified abnormalities of gait and mobility: Secondary | ICD-10-CM | POA: Diagnosis not present

## 2018-08-27 DIAGNOSIS — N183 Chronic kidney disease, stage 3 (moderate): Secondary | ICD-10-CM | POA: Diagnosis not present

## 2018-08-27 DIAGNOSIS — Z954 Presence of other heart-valve replacement: Secondary | ICD-10-CM | POA: Diagnosis not present

## 2018-08-27 DIAGNOSIS — K219 Gastro-esophageal reflux disease without esophagitis: Secondary | ICD-10-CM | POA: Diagnosis not present

## 2018-08-27 NOTE — Telephone Encounter (Signed)
Awaiting EKG results

## 2018-08-27 NOTE — Telephone Encounter (Signed)
° ° °  Please return call to [name] [phone]

## 2018-08-27 NOTE — Telephone Encounter (Signed)
Follow Up:    Sheryl returning your call.

## 2018-08-27 NOTE — Telephone Encounter (Signed)
Will increase Lasix x two days.  Instructions given and then see in clinic sometime this week.

## 2018-08-27 NOTE — Telephone Encounter (Addendum)
EKG results reviewed by Percival Spanish, MD. No further recommendations pertaining to results at this time  Spoke with Cindie Laroche, RN regarding update on all MD recommendations for pt; RN aware of recommendations

## 2018-08-27 NOTE — Telephone Encounter (Signed)
Incoming call from [employer], RN in Cardiac Rehab at [facility]. Per RN, pt has been keeping record of her weight and has gained 9lbs w/n a 4 day period. Pt is visibly SOB and edematous to bilat lower ext with pitting. RN also states pt edematous to abdomen which feels taut on palpation. Pt on furosemide 20mg  daily. Per RN, BP readings are 193/88 and 186/76. Pt on Coreg 25mg  BID, hydralazine 10mg  TID, and losartan 50mg  BID. RN also states ST elevation noted on tele which is new for pt. Will route to primary cardiologist, Percival Spanish, MD, currently in office

## 2018-08-27 NOTE — Telephone Encounter (Signed)
New Message    Pt c/o swelling: STAT is pt has developed SOB within 24 hours  1) How much weight have you gained and in what time span? 9lbs in 4 days  2) If swelling, where is the swelling located? Yes in lower legs and belly  3) Are you currently taking a fluid pill? Yes, Losartan & hydralizine  4) Are you currently SOB? Yes with an ST elevation reading on EKG  5) Do you have a log of your daily weights (if so, list)? Yes Nurse will fax to [facility].  6) Have you gained 3 pounds in a day or 5 pounds in a week? Yes   7) Have you traveled recently? No

## 2018-08-27 NOTE — Telephone Encounter (Signed)
Recommendation per Percival Spanish, MD is that pt increase Lasix to 40mg  daily for 2 days and then return for lab work [employer]) and also schedule OV this week if possible. Pt scheduled for OV 1/16 at 2:40p with Percival Spanish, MD and BMET ordered. LMTCB  for MD's recommendations

## 2018-08-27 NOTE — Telephone Encounter (Addendum)
Note on 08/27/2018 12:23 PM  Recommendation per Percival Spanish, MD is that pt increase Lasix to 40mg  daily for 2 days and then return for lab work [employer]) and also schedule OV this week if possible. Pt scheduled for OV 1/16 at 2:40p with Percival Spanish, MD and BMET ordered. LMTCB on pt voicemail for MD's recommendations

## 2018-08-28 ENCOUNTER — Telehealth: Payer: Self-pay | Admitting: Cardiology

## 2018-08-28 NOTE — Telephone Encounter (Signed)
Called patient and left VM's on both home and cell phone numbers to call office back to r/s appt. She was double booked in Dr. Rosezella [place] schedule in error.

## 2018-08-29 ENCOUNTER — Encounter

## 2018-08-29 ENCOUNTER — Other Ambulatory Visit: Payer: Self-pay | Admitting: Family Medicine

## 2018-08-29 ENCOUNTER — Ambulatory Visit: Payer: Medicare Other | Admitting: Neurology

## 2018-08-30 ENCOUNTER — Ambulatory Visit: Payer: Medicare Other | Admitting: Cardiology

## 2018-08-30 ENCOUNTER — Telehealth: Payer: Self-pay | Admitting: Cardiology

## 2018-08-30 MED ORDER — HYDRALAZINE HCL 10 MG PO TABS: 20.0000 mg | ORAL_TABLET | Freq: Three times a day (TID) | ORAL | 2 refills | Status: DC

## 2018-08-30 NOTE — Telephone Encounter (Signed)
Agree 

## 2018-08-30 NOTE — Telephone Encounter (Signed)
Pt updated with pharm D's recommendation. Pt verbalized understanding. Appointment scheduled for next thurs 1/23 with Almyra Deforest, PA

## 2018-08-30 NOTE — Telephone Encounter (Signed)
New Message   Pt c/o BP issue: STAT if pt c/o blurred vision, one-sided weakness or slurred speech  1. What are your last 5 BP readings? 171/60 HR 67, 206/83 HR 83, 173/68 HR 61, 185/73 HR 73, 198/77 HR, 74   2. Are you having any other symptoms (ex. Dizziness, headache, blurred vision, passed out)? No, feels weak   3. What is your BP issue? Higher BP and not sure if it has something to do with medication change  Pt had an appt and it was rescheduled due to an overbook and pt would like a sooner appt then 02/05

## 2018-08-30 NOTE — Telephone Encounter (Signed)
Spoke with pt who states before her appointment with Dr. Warren Lacy, her BP was running in the 150's so he increase her Cozaar to 50 mg BID and added Hydralazine 10 mg TID. She report since med change her BP has been increasing instead of going down. BP readings are as followed;  1/14-204/76 (before morning meds)          171/60 1/15-206/88 (befor morning meds)          173/68 1/16-185/73          198/77  Pt denies any headache or blurred vision but states she feels weak and get SOB with exertion. Will route to MD and Pharm D for recommendations.

## 2018-08-30 NOTE — Telephone Encounter (Signed)
Recommendation:  1. Increase hydralazine to 20mg  three times daily  2. Continue sodium restriction (no canned products , no frozen meals, etc.)  3. Change appointment to next available   4. If unable to see Dr Percival Spanish please schedule f/u with APP or pharmacist if patient agreeable.

## 2018-08-31 DIAGNOSIS — I129 Hypertensive chronic kidney disease with stage 1 through stage 4 chronic kidney disease, or unspecified chronic kidney disease: Secondary | ICD-10-CM | POA: Diagnosis not present

## 2018-08-31 DIAGNOSIS — Z954 Presence of other heart-valve replacement: Secondary | ICD-10-CM | POA: Diagnosis not present

## 2018-08-31 DIAGNOSIS — K219 Gastro-esophageal reflux disease without esophagitis: Secondary | ICD-10-CM | POA: Diagnosis not present

## 2018-08-31 DIAGNOSIS — N183 Chronic kidney disease, stage 3 (moderate): Secondary | ICD-10-CM | POA: Diagnosis not present

## 2018-08-31 DIAGNOSIS — E1122 Type 2 diabetes mellitus with diabetic chronic kidney disease: Secondary | ICD-10-CM | POA: Diagnosis not present

## 2018-08-31 DIAGNOSIS — R269 Unspecified abnormalities of gait and mobility: Secondary | ICD-10-CM | POA: Diagnosis not present

## 2018-09-05 ENCOUNTER — Ambulatory Visit (INDEPENDENT_AMBULATORY_CARE_PROVIDER_SITE_OTHER): Payer: Medicare Other

## 2018-09-05 ENCOUNTER — Ambulatory Visit (INDEPENDENT_AMBULATORY_CARE_PROVIDER_SITE_OTHER): Payer: Medicare Other | Admitting: Family Medicine

## 2018-09-05 ENCOUNTER — Encounter: Payer: Self-pay | Admitting: Family Medicine

## 2018-09-05 VITALS — BP 88/39 | HR 55 | Temp 96.8°F | Ht 67.0 in | Wt 237.0 lb

## 2018-09-05 DIAGNOSIS — R0602 Shortness of breath: Secondary | ICD-10-CM | POA: Diagnosis not present

## 2018-09-05 DIAGNOSIS — E1151 Type 2 diabetes mellitus with diabetic peripheral angiopathy without gangrene: Secondary | ICD-10-CM | POA: Diagnosis not present

## 2018-09-05 DIAGNOSIS — I10 Essential (primary) hypertension: Secondary | ICD-10-CM

## 2018-09-05 DIAGNOSIS — R7989 Other specified abnormal findings of blood chemistry: Secondary | ICD-10-CM

## 2018-09-05 DIAGNOSIS — E7800 Pure hypercholesterolemia, unspecified: Secondary | ICD-10-CM

## 2018-09-05 DIAGNOSIS — N183 Chronic kidney disease, stage 3 unspecified: Secondary | ICD-10-CM

## 2018-09-05 LAB — BMP8+EGFR
BUN/Creatinine Ratio: 24 (ref 12–28)
BUN: 55 mg/dL — ABNORMAL HIGH (ref 8–27)
CO2: 22 mmol/L (ref 20–29)
Calcium: 8.7 mg/dL (ref 8.7–10.3)
Chloride: 106 mmol/L (ref 96–106)
Creatinine, Ser: 2.29 mg/dL — ABNORMAL HIGH (ref 0.57–1.00)
GFR calc Af Amer: 25 mL/min/{1.73_m2} — ABNORMAL LOW (ref >59)
GFR calc non Af Amer: 21 mL/min/{1.73_m2} — ABNORMAL LOW (ref >59)
Glucose: 186 mg/dL — ABNORMAL HIGH (ref 65–99)
Potassium: 4.4 mmol/L (ref 3.5–5.2)
Sodium: 144 mmol/L (ref 134–144)

## 2018-09-05 LAB — BRAIN NATRIURETIC PEPTIDE: BNP: 116.6 pg/mL — ABNORMAL HIGH (ref 0.0–100.0)

## 2018-09-05 NOTE — Patient Instructions (Addendum)
Medicare Annual Wellness Visit  [facility] and the medical providers at [facility] strive to bring you the best medical care.  In doing so we not only want to address your current medical conditions and concerns but also to detect new conditions early and prevent illness, disease and health-related problems.    Medicare offers a yearly Wellness Visit which allows our clinical staff to assess your need for preventative services including immunizations, lifestyle education, counseling to decrease risk of preventable diseases and screening for fall risk and other medical concerns.    This visit is provided free of charge (no copay) for all Medicare recipients. The clinical pharmacists at [facility] have begun to conduct these Wellness Visits which will also include a thorough review of all your medications.    As you primary medical provider recommend that you make an appointment for your Annual Wellness Visit if you have not done so already this year.  You may set up this appointment before you leave today or you may call back ([phone]) and schedule an appointment.  Please make sure when you call that you mention that you are scheduling your Annual Wellness Visit with the clinical pharmacist so that the appointment may be made for the proper length of time.    Continue current medications. Continue good therapeutic lifestyle changes which include good diet and exercise. Fall precautions discussed with patient. If an FOBT was given today- please return it to our front desk. If you are over 68 years old - you may need Prevnar 7 or the adult Pneumonia vaccine.  **Flu shots are available--- please call and schedule a FLU-CLINIC appointment**  After your visit with [place] today you will receive a survey in the mail or online from [employer] regarding your care with [place]. Please take a moment to fill this out. Your feedback is very  important to [place] as you can help [place] better understand your patient needs as well as improve your experience and satisfaction. WE CARE ABOUT YOU!!!   Follow-up with cardiology as planned tomorrow unless any direction comes from him that we need to make changes prior to then We will discuss the case with him and make sure that we make any adjustments to medications today before you see him tomorrow. Continue to monitor blood sugars regularly  Until we discuss meds with DR Hochrein -  Cut back on the Losartan to 50 in the AM and 25 in the PM  Cut back on the Hydralazine to 1 three times a day instead of 2 three times a day.

## 2018-09-05 NOTE — Progress Notes (Signed)
Subjective:    Patient ID: [name], female    DOB: 02/12/1951, 68 y.o.   MRN: [id number]  HPI Pt here for follow up and management of chronic medical problems which includes hypertension and diabetes. She is taking medication regularly.  The patient today complains of shortness of breath and fatigue and wants to discuss her [place].  Her blood pressure readings are very low today on 2 readings.  She has a history of atrial fib type 2 diabetes cerebral infarction, chronic diastolic heart failure and morbid obesity secondary to elevated BMI and greater than 2 comorbid health conditions.  She plans to see Dr. Percival Spanish again tomorrow.  She is currently taking for most mild hydralazine losartan.  Her weight is up 8 pounds since January 8.  She has had severe aortic stenosis status post trans-aortic valve replacement.  She has chronic diastolic heart failure.  Today recently increased her losartan to 50 mg twice a day and is taking hydralazine 10 mg 3 times a day.  Her blood pressure is low this morning but her weight is up as indicated.  Patient is pleasant and alert.  The biggest issues today is complaints with shortness of breath with walking and extreme fatigue.  She denies any chest pain.  She denies any trouble with her stomach other than her ongoing chronic loose bowel movement problems that is no worse than usual.  She is passing her water without problems.  She is scheduled to get her routine lab work today but we will just address the issues of shortness of breath and edema today.   Patient Active Problem List   Diagnosis Date Noted  . Acute encephalopathy 06/16/2018  . Stroke (cerebrum) ([place]) 06/15/2018  . Acute on chronic diastolic heart failure ([place]) 05/10/2018  . Morbid obesity ([place])   . Hypertension   . CKD (chronic kidney disease), stage III ([place])   . Sleep apnea   . Hyperlipidemia   . GERD (gastroesophageal reflux disease)   . Atrial fibrillation ([place])   . S/P TAVR  (transcatheter aortic valve replacement)   . Severe aortic stenosis   . Vertigo 05/06/2015  . TIA (transient ischemic attack), history of 11/07/2014  . 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 CKD stage 3 due to type 2 diabetes mellitus ([place]) 06/03/2014  . Type II diabetes mellitus with renal manifestations ([place]) 06/03/2014  . Type II diabetes mellitus with peripheral circulatory disorder ([place]) 06/03/2014  . Benign essential HTN 12/26/2008  . Coronary artery disease, non-occlusive 12/26/2008  . Pericardial effusion 12/26/2008   Outpatient Encounter Medications as of 09/05/2018  Medication Sig  . ACCU-CHEK AVIVA PLUS test strip USE TO CHECK BLOOD SUGAR UP TO TWICE DAILY OR AS INSTRUCTED  . amiodarone (PACERONE) 200 MG tablet Take 1 tablet (200 mg total) by mouth daily.  Marland Kitchen apixaban (ELIQUIS) 5 MG TABS tablet Take 1 tablet (5 mg total) by mouth 2 (two) times daily.  . B-D INS SYR ULTRAFINE 1CC/31G 31G X 5/16" 1 ML MISC USE TO INJECT INSULIN TWICE A DAY AS INSTRUCTED (Patient taking differently: 1 Stick by Other route 3 (three) times daily before meals. )  . Calcium Carbonate-Vitamin D (CALCIUM 600+D) 600-400 MG-UNIT per tablet Take 1 tablet by mouth 2 (two) times daily. (Patient taking differently: Take 1 tablet by mouth 3 (three) times a week. )  . carvedilol (COREG) 25 MG tablet Take 1 tablet (25 mg total) by mouth 2 (two) times daily.  . furosemide (LASIX) 20 MG  tablet TAKE 1 TABLET BY MOUTH TWICE A DAY  . hydrALAZINE (APRESOLINE) 10 MG tablet Take 2 tablets (20 mg total) by mouth 3 (three) times daily.  . Insulin Pen Needle 32G X 4 MM MISC Use to inject insulin with insulin pen  . losartan (COZAAR) 50 MG tablet Take 1 tablet (50 mg total) by mouth 2 (two) times daily.  . meclizine (ANTIVERT) 12.5 MG tablet Take 12.5 mg by mouth 3 (three) times daily as needed for dizziness.  . mupirocin ointment (BACTROBAN) 2 % Apply 1 application topically 2 (two) times daily.  Marland Kitchen NOVOLIN 70/30 RELION  (70-30) 100 UNIT/ML injection Inject 15-65 Units into the skin See admin instructions. Taking 65 units with breakfast and 15 units at lunch and 65 units with dinner.  . ondansetron (ZOFRAN ODT) 4 MG disintegrating tablet Take 1 tablet (4 mg total) by mouth every 8 (eight) hours as needed for nausea.  . Pitavastatin Calcium (LIVALO) 4 MG TABS Take 4 mg by mouth daily.  . Prenatal Vit-Fe Fumarate-FA (PRENATAL MULTIVITAMIN) TABS tablet Take 1 tablet by mouth 3 (three) times a week.  Marland Kitchen Propylene Glycol (SYSTANE COMPLETE) 0.6 % SOLN Apply 1 drop to eye daily as needed (dry eyes).   . sodium chloride (OCEAN) 0.65 % SOLN nasal spray Place 1 spray into both nostrils as needed for congestion.  . vitamin E 400 UNIT capsule Take 400 Units by mouth 3 (three) times a week.   . clindamycin (CLEOCIN) 300 MG capsule Take 2 tablets (600 mg) one hour prior to dental visits. (Patient not taking: Reported on 09/05/2018)  . [DISCONTINUED] Olmesartan-Amlodipine-HCTZ (TRIBENZOR) 40-5-12.5 MG TABS Take 1 tablet by mouth daily.     No facility-administered encounter medications on file as of 09/05/2018.       Review of Systems  Constitutional: Positive for fatigue.  HENT: Negative.   Eyes: Negative.   Respiratory: Positive for shortness of breath (worse with exertion ).   Cardiovascular: Negative.   Gastrointestinal: Negative.   Endocrine: Negative.   Genitourinary: Negative.   Musculoskeletal: Negative.   Skin: Negative.   Allergic/Immunologic: Negative.   Neurological: Negative.   Hematological: Negative.   Psychiatric/Behavioral: Negative.        Objective:   Physical Exam Vitals signs and nursing note reviewed.  Constitutional:      Appearance: She is well-developed. She is obese. She is diaphoretic. She is not ill-appearing.     Comments: The patient is pleasant and alert.  She does complain of worsening shortness of breath and more edema than usual.  HENT:     Head: Normocephalic and atraumatic.      Right Ear: Tympanic membrane, ear canal and external ear normal.     Left Ear: Tympanic membrane, ear canal and external ear normal.     Nose: Nose normal. No congestion or rhinorrhea.     Mouth/Throat:     Mouth: Mucous membranes are dry.     Pharynx: Oropharynx is clear. No oropharyngeal exudate or posterior oropharyngeal erythema.  Eyes:     General: No scleral icterus.       Right eye: No discharge.        Left eye: No discharge.     Extraocular Movements: Extraocular movements intact.     Conjunctiva/sclera: Conjunctivae normal.     Pupils: Pupils are equal, round, and reactive to light.  Neck:     Musculoskeletal: Normal range of motion and neck supple.     Thyroid: No thyromegaly.  Vascular: No JVD.  Cardiovascular:     Rate and Rhythm: Normal rate and regular rhythm.     Heart sounds: Normal heart sounds. No murmur.     Comments: Heart is regular at 60/min Pulmonary:     Effort: Pulmonary effort is normal.     Breath sounds: No wheezing or rales.     Comments: Rare wheezes no rales Musculoskeletal: Normal range of motion.        General: No tenderness.  Lymphadenopathy:     Cervical: No cervical adenopathy.  Skin:    General: Skin is warm.  Neurological:     Mental Status: She is alert and oriented to person, place, and time.     Cranial Nerves: No cranial nerve deficit.     Deep Tendon Reflexes: Reflexes are normal and symmetric.  Psychiatric:        Behavior: Behavior normal.        Thought Content: Thought content normal.        Judgment: Judgment normal.    BP (!) 88/39 (BP Location: Left Arm)   Pulse (!) 55   Temp (!) 96.8 F (36 C) (Oral)   Ht 5' 7"  (1.702 m)   Wt 237 lb (107.5 kg)   SpO2 96%   BMI 37.12 kg/m   Chest x-ray with results pending Lab work will be done to get stat results so that when patient sees cardiology tomorrow these results will be back      Assessment & Plan:  1. Elevated systolic blood pressure reading with diagnosis of  hypertension -Pressure readings from patient and in this office are lower.  A manual reading by me was 100/50.  This was in the left arm with the patient sitting. - CBC with Differential/Platelet; Future - BMP8+EGFR; Future - DG Chest 2 View; Future  2. Benign essential HTN -Blood pressure readings are running low we will make adjustments accordingly after speaking to the cardiologist and let patient know of any changes - CBC with Differential/Platelet; Future - BMP8+EGFR; Future - Hepatic function panel; Future - DG Chest 2 View; Future - BMP8+EGFR  3. Type II diabetes mellitus with peripheral circulatory disorder (HCC) -Check routine labs at some point in the future when insurance will cover these.  Today we will get a BNP and a BMP.  This will be done stat. - CBC with Differential/Platelet; Future - Bayer DCA Hb A1c Waived; Future  4. Chronic kidney disease, stage 3 (moderate) (HCC) - CBC with Differential/Platelet; Future - BMP8+EGFR; Future - BMP8+EGFR  5. Pure hypercholesterolemia -New current treatment pending results of lab work - CBC with Differential/Platelet; Future - Lipid panel; Future  6. Low serum vitamin D - CBC with Differential/Platelet; Future - VITAMIN D 25 Hydroxy (Vit-D Deficiency, Fractures); Future  7. SOB (shortness of breath) -Pulse ox in the office today was 96%.  There was 1+ pretibial and there was also some presacral edema. - DG Chest 2 View; Future - Brain natriuretic peptide  Patient Instructions                       Medicare Annual Wellness Visit  [facility] and the medical providers at [facility] strive to bring you the best medical care.  In doing so we not only want to address your current medical conditions and concerns but also to detect new conditions early and prevent illness, disease and health-related problems.    Medicare offers a yearly Wellness Visit which  allows our clinical staff to assess your need  for preventative services including immunizations, lifestyle education, counseling to decrease risk of preventable diseases and screening for fall risk and other medical concerns.    This visit is provided free of charge (no copay) for all Medicare recipients. The clinical pharmacists at [facility] have begun to conduct these Wellness Visits which will also include a thorough review of all your medications.    As you primary medical provider recommend that you make an appointment for your Annual Wellness Visit if you have not done so already this year.  You may set up this appointment before you leave today or you may call back ([phone]) and schedule an appointment.  Please make sure when you call that you mention that you are scheduling your Annual Wellness Visit with the clinical pharmacist so that the appointment may be made for the proper length of time.    Continue current medications. Continue good therapeutic lifestyle changes which include good diet and exercise. Fall precautions discussed with patient. If an FOBT was given today- please return it to our front desk. If you are over 27 years old - you may need Prevnar 57 or the adult Pneumonia vaccine.  **Flu shots are available--- please call and schedule a FLU-CLINIC appointment**  After your visit with [place] today you will receive a survey in the mail or online from [employer] regarding your care with [place]. Please take a moment to fill this out. Your feedback is very important to [place] as you can help [place] better understand your patient needs as well as improve your experience and satisfaction. WE CARE ABOUT YOU!!!  Follow-up with cardiology as planned tomorrow unless any direction comes from him that we need to make changes prior to then We will discuss the case with him and make sure that we make any adjustments to medications today before you see him tomorrow. Continue to monitor blood sugars regularly   Arrie Senate  MD

## 2018-09-06 ENCOUNTER — Ambulatory Visit (INDEPENDENT_AMBULATORY_CARE_PROVIDER_SITE_OTHER): Payer: Medicare Other | Admitting: Physician Assistant

## 2018-09-06 ENCOUNTER — Encounter: Payer: Self-pay | Admitting: Physician Assistant

## 2018-09-06 VITALS — BP 128/52 | HR 56 | Ht 67.5 in | Wt 235.0 lb

## 2018-09-06 DIAGNOSIS — I2510 Atherosclerotic heart disease of native coronary artery without angina pectoris: Secondary | ICD-10-CM | POA: Diagnosis not present

## 2018-09-06 DIAGNOSIS — G4733 Obstructive sleep apnea (adult) (pediatric): Secondary | ICD-10-CM | POA: Diagnosis not present

## 2018-09-06 DIAGNOSIS — E785 Hyperlipidemia, unspecified: Secondary | ICD-10-CM | POA: Diagnosis not present

## 2018-09-06 DIAGNOSIS — R600 Localized edema: Secondary | ICD-10-CM

## 2018-09-06 DIAGNOSIS — Z952 Presence of prosthetic heart valve: Secondary | ICD-10-CM | POA: Diagnosis not present

## 2018-09-06 DIAGNOSIS — N189 Chronic kidney disease, unspecified: Secondary | ICD-10-CM | POA: Diagnosis not present

## 2018-09-06 DIAGNOSIS — I4819 Other persistent atrial fibrillation: Secondary | ICD-10-CM

## 2018-09-06 DIAGNOSIS — Z8673 Personal history of transient ischemic attack (TIA), and cerebral infarction without residual deficits: Secondary | ICD-10-CM | POA: Diagnosis not present

## 2018-09-06 DIAGNOSIS — I10 Essential (primary) hypertension: Secondary | ICD-10-CM

## 2018-09-06 MED ORDER — LOSARTAN POTASSIUM 50 MG PO TABS: ORAL_TABLET | ORAL | 3 refills | Status: DC

## 2018-09-06 MED ORDER — FUROSEMIDE 20 MG PO TABS: 20.0000 mg | ORAL_TABLET | Freq: Every day | ORAL | 0 refills | Status: DC

## 2018-09-06 MED ORDER — HYDRALAZINE HCL 10 MG PO TABS: 10.0000 mg | ORAL_TABLET | Freq: Three times a day (TID) | ORAL | 2 refills | Status: DC

## 2018-09-06 NOTE — Patient Instructions (Signed)
Medication Instructions:  Decreased Hydralazine to one tablet three times daily. Change Losartan to 50 mg in the morning, and 25 mg in the evenings. Decrease Lasix to 1 tablet by mouth daily.  If you need a refill on your cardiac medications before your next appointment, please call your pharmacy.   Lab work: BMET in one week. If you have labs (blood work) drawn today and your tests are completely normal, you will receive your results only by: Marland Kitchen MyChart Message (if you have MyChart) OR . A paper copy in the mail If you have any lab test that is abnormal or we need to change your treatment, we will call you to review the results.   Follow-Up: At [facility], you and your health needs are our priority.  As part of our continuing mission to provide you with exceptional heart care, we have created designated Provider Care Teams.  These Care Teams include your primary Cardiologist (physician) and Advanced Practice Providers (APPs -  Physician Assistants and Nurse Practitioners) who all work together to provide you with the care you need, when you need it. Marland Kitchen Keep Follow up with Dr.Hochrein as scheduled.

## 2018-09-06 NOTE — Progress Notes (Signed)
Cardiology Office Note    Date:  09/08/2018   ID:  Mariama, [place] 22-Aug-1950, MRN [id number]  PCP:  Chipper Herb, MD  Cardiologist:  Dr. Percival Spanish   Chief Complaint  Patient presents with  . Follow-up    seen for Dr. Percival Spanish.     History of Present Illness:  Yulieth P Hinzman is a 68 y.o. female with PMH of persistent atrial fibrillation on eliquis, CAD, CKD stage III, GERD, hypertension, hyperlipidemia, DM II, morbid obesity, history of severe aortic stenosis status post TAVR, obstructive sleep apnea and TIA.  Last cardiac catheterization performed on 03/28/2018 showed a 50% ostial RCA lesion, 40% mid RCA lesion, 40% ostial RPDA lesion, 50% ostial left circumflex lesion, 65% proximal left circumflex lesion, 70% mid left circumflex lesion, 99% mid to distal LAD stenosis which was fairly small.  CAD was medically managed.  Patient eventually underwent TAVR by Dr. Angelena Form and Dr. Dema Severin on 05/08/2018 with placement of [place] 3 THV bioprosthetic valve.  Postprocedure, he was placed on Lopressor 25 mg twice daily for sinus tachycardia.  He also had new left bundle branch block after the TAVR as well.  Creatinine was 1.7, therefore losartan was held.  She was discharged on both aspirin and Eliquis.  Losartan was later added back due to high blood pressure.  She was seen by valve clinic in October 2019 at which time she was doing well.  Patient presented back to the hospital in November 2019 with confusion.  MRI was initially interpreted as a stroke, however on repeat Doppler read by neurologist, this was felt to be artifact.  Patient was most recently seen by Dr. Percival Spanish on 08/22/2018 for hypertension.  Cozaar was increased to 50 mg twice daily.  Hydralazine 10 mg 3 times daily was added to his medical regimen.He has significant swelling in the past with Norvasc.  Patient was last seen by her PCP yesterday, who noted her blood pressure was quite low in the 80s.  Manual reading obtained by  PCP was 100/50.  Patient presents today for further evaluation.  Apparently yesterday, her blood pressure was 88/39.  She was feeling very poorly at that time.  On repeat manual check, her blood pressure came back up to 100/50.  She was instructed to decrease losartan back to 100 mg a.m. and 50 mg p.m.  I will further decrease the hydralazine to the previous dose 10 mg 3 times daily.  On physical exam, she continued to have 2+ pitting edema.  She says she has this edema for several years now.  Even with the increased Lasix dose, her lower extremity edema has not improved, however her creatinine has trended up with the increased dose of Lasix.  Her weight is also going up as well.  Although the increased weight and lower extremity edema usually indicate volume overload, however acute renal failure occurred after her Lasix was doubled, I suspect intravascularly she may be on the drier side.  I will decrease her Lasix to 20 mg daily and repeat a basic metabolic panel in 1 week.  If her renal function does not improve, we may need to use conservative management with compression stocking and leg elevation.  However if her renal function continued to deteriorate, we may have to consider a right heart failure to assess her pressure on the right side.   Past Medical History:  Diagnosis Date  . Atrial fibrillation ([place])    a. maintaining sinus s/p DCCV, on Eliquis  .  CAD (coronary artery disease)    a. Nonobstructive by cath 2006 - 25% LAD, 25-30% after 1st diag, distal 25% PDA, minor irreg LCx. b. Normal nuc 2011.  . CKD (chronic kidney disease), stage III ([place])   . Cystocele   . Difficult intubation 09-07-2012   big neck trouble with intubation parotid surgery"takes little med to sedate"  . Esophageal stricture   . GERD (gastroesophageal reflux disease)   . Hiatal hernia    a. 07/2013: HH with small stricture holding up barium tablet (concides with patient's sx of food sticking).  . History of kidney  stones   . Hyperlipidemia   . Hypertension   . IBS (irritable bowel syndrome)   . Memory difficulty 11/26/2015  . Morbid obesity ([place])    a. hx of gastric bypass (sleeve gastrectomy 2015)  . Osteoporosis   . 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  Marland Kitchen Pericardial effusion    a. Mod by echo 2012 at time of PNA. b. Echo 07/2012: small pericardial effusion vs fat.  . S/P TAVR (transcatheter aortic valve replacement)   . Severe aortic stenosis   . Sleep apnea     "have mask; don't use it" (02/14/2018)  . TIA (transient ischemic attack) 10/2014; 06/2016   "some memory issues since; daughter says my speech is sometimes different" (02/14/2018)  . Type II diabetes mellitus ([place])     Past Surgical History:  Procedure Laterality Date  . BREATH TEK H PYLORI N/A 07/29/2013   Procedure: [place];  Surgeon: Shann Medal, MD;  Location: [facility];  Service: General;  Laterality: N/A;  . CARDIAC CATHETERIZATION  2006  . CARDIOVERSION N/A 02/16/2018   Procedure: CARDIOVERSION;  Surgeon: Pixie Casino, MD;  Location: [facility];  Service: Cardiovascular;  Laterality: N/A;  . CARPAL TUNNEL RELEASE Left 2011  . CATARACT EXTRACTION W/ INTRAOCULAR LENS  IMPLANT, BILATERAL Bilateral 2003   left  . CHOLECYSTECTOMY OPEN  1982  . COLONOSCOPY N/A 07/14/2015   Procedure: COLONOSCOPY;  Surgeon: Ladene Artist, MD;  Location: [facility];  Service: Endoscopy;  Laterality: N/A;  . ESOPHAGOGASTRODUODENOSCOPY N/A 12/27/2013   Procedure: ESOPHAGOGASTRODUODENOSCOPY (EGD);  Surgeon: Shann Medal, MD;  Location: [facility];  Service: General;  Laterality: N/A;  . ESOPHAGOGASTRODUODENOSCOPY (EGD) WITH ESOPHAGEAL DILATION    . INTRAOPERATIVE TRANSTHORACIC ECHOCARDIOGRAM  05/08/2018   Procedure: INTRAOPERATIVE TRANSTHORACIC ECHOCARDIOGRAM;  Surgeon: Burnell Blanks, MD;  Location: [place];  Service: Open Heart Surgery;;  . LAPAROSCOPIC GASTRIC SLEEVE RESECTION N/A 01/20/2014    Procedure: LAPAROSCOPIC GASTRIC SLEEVE RESECTION;  Surgeon: Shann Medal, MD;  Location: [facility];  Service: General;  Laterality: N/A;  . PAROTIDECTOMY  09/07/2012   Procedure: PAROTIDECTOMY;  Surgeon: Melida Quitter, MD;  Location: [facility];  Service: ENT;  Laterality: Left;  LEFT PAROTIDECTOMY  . RIGHT/LEFT HEART CATH AND CORONARY ANGIOGRAPHY N/A 03/29/2018   Procedure: RIGHT/LEFT HEART CATH AND CORONARY ANGIOGRAPHY;  Surgeon: Burnell Blanks, MD;  Location: [facility];  Service: Cardiovascular;  Laterality: N/A;  . TEE WITHOUT CARDIOVERSION N/A 02/16/2018   Procedure: TRANSESOPHAGEAL ECHOCARDIOGRAM (TEE);  Surgeon: Pixie Casino, MD;  Location: [facility];  Service: Cardiovascular;  Laterality: N/A;  . TOE AMPUTATION Left 07/2017   great toe; [facility]   . TONSILLECTOMY  1968  . TRANSCATHETER AORTIC VALVE REPLACEMENT, TRANSFEMORAL  05/08/2018   Transcatheter Aortic Valve Replacement   . TRANSCATHETER AORTIC VALVE REPLACEMENT, TRANSFEMORAL N/A 05/08/2018   Procedure: TRANSCATHETER  AORTIC VALVE REPLACEMENT, TRANSFEMORAL;  Surgeon: Burnell Blanks, MD;  Location: [place] [facility];  Service: Open Heart Surgery;  Laterality: N/A;  . TUBAL LIGATION  yrs ago  . UPPER GI ENDOSCOPY  01/20/2014   Procedure: UPPER GI ENDOSCOPY;  Surgeon: Shann Medal, MD;  Location: [facility];  Service: General;;    Current Medications: Outpatient Medications Prior to Visit  Medication Sig Dispense Refill  . ACCU-CHEK AVIVA PLUS test strip USE TO CHECK BLOOD SUGAR UP TO TWICE DAILY OR AS INSTRUCTED 100 each 2  . amiodarone (PACERONE) 200 MG tablet Take 1 tablet (200 mg total) by mouth daily. 90 tablet 3  . apixaban (ELIQUIS) 5 MG TABS tablet Take 1 tablet (5 mg total) by mouth 2 (two) times daily. 180 tablet 3  . B-D INS SYR ULTRAFINE 1CC/31G 31G X 5/16" 1 ML MISC USE TO INJECT INSULIN TWICE A DAY AS INSTRUCTED (Patient taking differently: 1 Stick by Other route 3 (three) times daily before meals. ) 100 each 2    . Calcium Carbonate-Vitamin D (CALCIUM 600+D) 600-400 MG-UNIT per tablet Take 1 tablet by mouth 2 (two) times daily. (Patient taking differently: Take 1 tablet by mouth 3 (three) times a week. )    . carvedilol (COREG) 25 MG tablet Take 1 tablet (25 mg total) by mouth 2 (two) times daily. 60 tablet 11  . clindamycin (CLEOCIN) 300 MG capsule Take 2 tablets (600 mg) one hour prior to dental visits. 6 capsule 3  . Insulin Pen Needle 32G X 4 MM MISC Use to inject insulin with insulin pen 100 each 4  . meclizine (ANTIVERT) 12.5 MG tablet Take 12.5 mg by mouth 3 (three) times daily as needed for dizziness.    . mupirocin ointment (BACTROBAN) 2 % Apply 1 application topically 2 (two) times daily. 22 g 0  . NOVOLIN 70/30 RELION (70-30) 100 UNIT/ML injection Inject 15-65 Units into the skin See admin instructions. Taking 65 units with breakfast and 15 units at lunch and 65 units with dinner.    . ondansetron (ZOFRAN ODT) 4 MG disintegrating tablet Take 1 tablet (4 mg total) by mouth every 8 (eight) hours as needed for nausea. 10 tablet 0  . Pitavastatin Calcium (LIVALO) 4 MG TABS Take 4 mg by mouth daily.    . Prenatal Vit-Fe Fumarate-FA (PRENATAL MULTIVITAMIN) TABS tablet Take 1 tablet by mouth 3 (three) times a week.    Marland Kitchen Propylene Glycol (SYSTANE COMPLETE) 0.6 % SOLN Apply 1 drop to eye daily as needed (dry eyes).     . sodium chloride (OCEAN) 0.65 % SOLN nasal spray Place 1 spray into both nostrils as needed for congestion. 15 mL 0  . vitamin E 400 UNIT capsule Take 400 Units by mouth 3 (three) times a week.     . furosemide (LASIX) 20 MG tablet TAKE 1 TABLET BY MOUTH TWICE A DAY 180 tablet 0  . hydrALAZINE (APRESOLINE) 10 MG tablet Take 2 tablets (20 mg total) by mouth 3 (three) times daily. 180 tablet 2  . losartan (COZAAR) 50 MG tablet Take 1 tablet (50 mg total) by mouth 2 (two) times daily. 180 tablet 3   No facility-administered medications prior to visit.      Allergies:   Amlodipine;  Atorvastatin; Crestor [rosuvastatin calcium]; Ezetimibe-simvastatin; Statins; Celebrex [celecoxib]; Levemir [insulin detemir]; and Penicillins   Social History   Socioeconomic History  . Marital status: Married    Spouse name: Not on file  . Number of children: 3  .  Years of education: some college  . Highest education level: Some college, no degree  Occupational History  . Occupation: retired    Fish farm manager: [employer]  . Financial resource strain: Not hard at all  . Food insecurity:    Worry: Never true    Inability: Never true  . Transportation needs:    Medical: No    Non-medical: No  Tobacco Use  . Smoking status: Never Smoker  . Smokeless tobacco: Never Used  Substance and Sexual Activity  . Alcohol use: Yes    Alcohol/week: 0.0 standard drinks    Comment:  "mixed drink q few years"  . Drug use: Never  . Sexual activity: Yes    Birth control/protection: Post-menopausal  Lifestyle  . Physical activity:    Days per week: 0 days    Minutes per session: 0 min  . Stress: Only a little  Relationships  . Social connections:    Talks on phone: More than three times a week    Gets together: More than three times a week    Attends religious service: More than 4 times per year    Active member of club or organization: Yes    Attends meetings of clubs or organizations: More than 4 times per year    Relationship status: Married  Other Topics Concern  . Not on file  Social History Narrative   Married   Patient is right handed.   Patient rarely drinks caffeine.   Lives at home with husband in two story home. Husband washes laundry in the basement. They have 3 daughters and 7 grandchildren.      Family History:  The patient's family history includes Colitis in her mother; Colon polyps in her father; Diabetes in her father, maternal grandfather, mother, and another family member; Heart attack in her sister; Heart disease in her father; Heart failure in her  father and mother; Hypertension in her mother; Kidney Stones in her mother; Skin cancer in her mother; Stroke in her maternal grandfather and mother.   ROS:   Please see the history of present illness.    ROS All other systems reviewed and are negative.   PHYSICAL EXAM:   VS:  BP (!) 128/52   Pulse (!) 56   Ht 5' 7.5" (1.715 m)   Wt 235 lb (106.6 kg)   BMI 36.26 kg/m    GEN: Well nourished, well developed, in no acute distress  HEENT: normal  Neck: no JVD, carotid bruits, or masses Cardiac: RRR; no murmurs, rubs, or gallops. LE edema, woody appearance  Respiratory:  clear to auscultation bilaterally, normal work of breathing GI: soft, nontender, nondistended, + BS MS: no deformity or atrophy  Skin: warm and dry, no rash Neuro:  Alert and Oriented x 3, Strength and sensation are intact Psych: euthymic mood, full affect  Wt Readings from Last 3 Encounters:  09/06/18 235 lb (106.6 kg)  09/05/18 237 lb (107.5 kg)  08/22/18 229 lb (103.9 kg)      Studies/Labs Reviewed:   EKG:  EKG is not ordered today.    Recent Labs: 02/14/2018: TSH 0.780 05/09/2018: Magnesium 1.5 06/15/2018: ALT 20 06/19/2018: Hemoglobin 13.5; Platelets 235 09/05/2018: BNP 116.6; BUN 55; Creatinine, Ser 2.29; Potassium 4.4; Sodium 144   Lipid Panel    Component Value Date/Time   CHOL 192 06/16/2018 0154   CHOL 217 (H) 04/18/2018 0942   CHOL 286 (H) 01/24/2013 1100   TRIG 159 (H) 06/16/2018 0154  TRIG 200 (H) 06/03/2014 1132   TRIG 468 (H) 01/24/2013 1100   HDL 45 06/16/2018 0154   HDL 55 04/18/2018 0942   HDL 43 06/03/2014 1132   HDL 47 01/24/2013 1100   CHOLHDL 4.3 06/16/2018 0154   VLDL 32 06/16/2018 0154   LDLCALC 115 (H) 06/16/2018 0154   LDLCALC 126 (H) 04/18/2018 0942   LDLCALC 101 (H) 06/03/2014 1132   LDLCALC 145 (H) 01/24/2013 1100   LDLDIRECT 132 (H) 05/09/2016 0814   LDLDIRECT 113 (H) 02/26/2013 0845    Additional studies/ records that were reviewed today include:   Cath  03/29/2018  Ost RCA to Prox RCA lesion is 50% stenosed.  Mid RCA lesion is 40% stenosed.  Mid RCA to Dist RCA lesion is 20% stenosed.  Ost RPDA to RPDA lesion is 40% stenosed.  Ost Cx lesion is 50% stenosed.  Prox Cx lesion is 65% stenosed.  Mid Cx lesion is 70% stenosed.  Ost 3rd Mrg lesion is 20% stenosed.  Mid LM to Dist LM lesion is 25% stenosed.  Mid LAD to Dist LAD lesion is 99% stenosed.  Ost LAD to Prox LAD lesion is 20% stenosed.  Hemodynamic findings consistent with moderate pulmonary hypertension.   1. Moderate disease in the ostium of the RCA and in the mid RCA.  2. Moderate disease in the proximal segment of the Circumflex and in the distal OM branch.  3. Mild proximal LAD stenosis. The distal LAD tapers to a small caliber vessel and is diffusely diseased with 99% stenosis, too small for PCI.  4. Elevated filling pressures.  5. Moderately severe to Severe aortic stenosis (mean gradient 29.6 mmHg, peak to peak gradient 34 mmHg, AVA 1.11 cm2). Echo images demonstrate a heavily calcified and thickened valve with fusion of two of the leaflets. I think her aortic stenosis is in the severe range.   Recommendations: Medical management of CAD. Will continue with workup for TAVR vs AVR. I suspect that she will be a better candidate for TAVR given her co-morbidities. She may benefit from diuresis.       ASSESSMENT:    1. Essential hypertension   2. Chronic kidney disease, unspecified CKD stage   3. Persistent atrial fibrillation   4. Coronary artery disease involving native coronary artery of native heart without angina pectoris   5. Hyperlipidemia LDL goal <70   6. Morbid obesity ([place])   7. S/P TAVR (transcatheter aortic valve replacement)   8. OSA (obstructive sleep apnea)   9. H/O TIA (transient ischemic attack) and stroke   10. Leg edema      PLAN:  In order of problems listed above:  1. Hypertension: Her blood pressure went down to the 80s yesterday.   Losartan was cut back, I will further decrease hydralazine to 10 mg 3 times daily  2. CKD: With further diuresis, her renal function declined.  Reduce Lasix to daily dosing.  Repeat basic metabolic panel in 1 week  3. Lower extremity edema: With increased dose of Lasix, her renal function declined.  She has chronic lower extremity edema for several years now.  I am not sure if increase the Lasix will help her edema in this case  4. CAD: Last cardiac catheterization will was in 2019, patient has diffuse disease, however the plan is to manage medically.  5. History of TAVR: No significant issue  6. Morbid obesity: Weight loss is recommended  7. Hyperlipidemia: On pitavastatin  8. Persistent atrial fibrillation: Controlled on Eliquis, amiodarone  and carvedilol.  9. Obstructive sleep apnea: Continue CPAP.    10. History of TIA: No recent recurrence, continue Eliquis for persistent atrial fibrillation    Medication Adjustments/Labs and Tests Ordered: Current medicines are reviewed at length with the patient today.  Concerns regarding medicines are outlined above.  Medication changes, Labs and Tests ordered today are listed in the Patient Instructions below. Patient Instructions  Medication Instructions:  Decreased Hydralazine to one tablet three times daily. Change Losartan to 50 mg in the morning, and 25 mg in the evenings. Decrease Lasix to 1 tablet by mouth daily.  If you need a refill on your cardiac medications before your next appointment, please call your pharmacy.   Lab work: BMET in one week. If you have labs (blood work) drawn today and your tests are completely normal, you will receive your results only by: Marland Kitchen MyChart Message (if you have MyChart) OR . A paper copy in the mail If you have any lab test that is abnormal or we need to change your treatment, we will call you to review the results.   Follow-Up: At [facility], you and your health needs are our priority.  As  part of our continuing mission to provide you with exceptional heart care, we have created designated Provider Care Teams.  These Care Teams include your primary Cardiologist (physician) and Advanced Practice Providers (APPs -  Physician Assistants and Nurse Practitioners) who all work together to provide you with the care you need, when you need it. Marland Kitchen Keep Follow up with Dr.Hochrein as scheduled.         Hilbert Corrigan, [place]  09/08/2018 11:53 PM    [facility] [place], [place], [place]  [address] Phone: [phone]; Fax: [phone]

## 2018-09-07 DIAGNOSIS — I129 Hypertensive chronic kidney disease with stage 1 through stage 4 chronic kidney disease, or unspecified chronic kidney disease: Secondary | ICD-10-CM | POA: Diagnosis not present

## 2018-09-07 DIAGNOSIS — K219 Gastro-esophageal reflux disease without esophagitis: Secondary | ICD-10-CM | POA: Diagnosis not present

## 2018-09-07 DIAGNOSIS — E1122 Type 2 diabetes mellitus with diabetic chronic kidney disease: Secondary | ICD-10-CM | POA: Diagnosis not present

## 2018-09-07 DIAGNOSIS — N183 Chronic kidney disease, stage 3 (moderate): Secondary | ICD-10-CM | POA: Diagnosis not present

## 2018-09-07 DIAGNOSIS — Z954 Presence of other heart-valve replacement: Secondary | ICD-10-CM | POA: Diagnosis not present

## 2018-09-07 DIAGNOSIS — R269 Unspecified abnormalities of gait and mobility: Secondary | ICD-10-CM | POA: Diagnosis not present

## 2018-09-08 ENCOUNTER — Encounter: Payer: Self-pay | Admitting: Physician Assistant

## 2018-09-10 DIAGNOSIS — Z954 Presence of other heart-valve replacement: Secondary | ICD-10-CM | POA: Diagnosis not present

## 2018-09-10 DIAGNOSIS — R269 Unspecified abnormalities of gait and mobility: Secondary | ICD-10-CM | POA: Diagnosis not present

## 2018-09-10 DIAGNOSIS — E1122 Type 2 diabetes mellitus with diabetic chronic kidney disease: Secondary | ICD-10-CM | POA: Diagnosis not present

## 2018-09-10 DIAGNOSIS — K219 Gastro-esophageal reflux disease without esophagitis: Secondary | ICD-10-CM | POA: Diagnosis not present

## 2018-09-10 DIAGNOSIS — I129 Hypertensive chronic kidney disease with stage 1 through stage 4 chronic kidney disease, or unspecified chronic kidney disease: Secondary | ICD-10-CM | POA: Diagnosis not present

## 2018-09-10 DIAGNOSIS — N183 Chronic kidney disease, stage 3 (moderate): Secondary | ICD-10-CM | POA: Diagnosis not present

## 2018-09-12 ENCOUNTER — Telehealth: Payer: Self-pay | Admitting: *Deleted

## 2018-09-12 DIAGNOSIS — K219 Gastro-esophageal reflux disease without esophagitis: Secondary | ICD-10-CM | POA: Diagnosis not present

## 2018-09-12 DIAGNOSIS — E1122 Type 2 diabetes mellitus with diabetic chronic kidney disease: Secondary | ICD-10-CM | POA: Diagnosis not present

## 2018-09-12 DIAGNOSIS — I129 Hypertensive chronic kidney disease with stage 1 through stage 4 chronic kidney disease, or unspecified chronic kidney disease: Secondary | ICD-10-CM | POA: Diagnosis not present

## 2018-09-12 DIAGNOSIS — N183 Chronic kidney disease, stage 3 (moderate): Secondary | ICD-10-CM | POA: Diagnosis not present

## 2018-09-12 DIAGNOSIS — R269 Unspecified abnormalities of gait and mobility: Secondary | ICD-10-CM | POA: Diagnosis not present

## 2018-09-12 DIAGNOSIS — Z954 Presence of other heart-valve replacement: Secondary | ICD-10-CM | POA: Diagnosis not present

## 2018-09-12 NOTE — Telephone Encounter (Signed)
Called to f/u on patient at Dr Hochrein's request.  NA and no VM at pt's home number.  Will continue to attempt to contact her.

## 2018-09-12 NOTE — Telephone Encounter (Signed)
Have been unable to contact pt today.  She has f/u appt scheduled in the [facility] office 09/19/2018 - will see her at that time unless further concerns arise before then.

## 2018-09-13 ENCOUNTER — Other Ambulatory Visit: Payer: Medicare Other

## 2018-09-13 DIAGNOSIS — N189 Chronic kidney disease, unspecified: Secondary | ICD-10-CM | POA: Diagnosis not present

## 2018-09-14 ENCOUNTER — Other Ambulatory Visit: Payer: Self-pay | Admitting: Cardiology

## 2018-09-14 LAB — BASIC METABOLIC PANEL
BUN/Creatinine Ratio: 20 (ref 12–28)
BUN: 41 mg/dL — ABNORMAL HIGH (ref 8–27)
CO2: 24 mmol/L (ref 20–29)
Calcium: 9.1 mg/dL (ref 8.7–10.3)
Chloride: 108 mmol/L — ABNORMAL HIGH (ref 96–106)
Creatinine, Ser: 2.02 mg/dL — ABNORMAL HIGH (ref 0.57–1.00)
GFR calc Af Amer: 29 mL/min/{1.73_m2} — ABNORMAL LOW (ref >59)
GFR calc non Af Amer: 25 mL/min/{1.73_m2} — ABNORMAL LOW (ref >59)
Glucose: 180 mg/dL — ABNORMAL HIGH (ref 65–99)
Potassium: 5.4 mmol/L — ABNORMAL HIGH (ref 3.5–5.2)
Sodium: 147 mmol/L — ABNORMAL HIGH (ref 134–144)

## 2018-09-17 ENCOUNTER — Telehealth: Payer: Self-pay | Admitting: Cardiology

## 2018-09-17 DIAGNOSIS — K219 Gastro-esophageal reflux disease without esophagitis: Secondary | ICD-10-CM | POA: Diagnosis not present

## 2018-09-17 DIAGNOSIS — Z8249 Family history of ischemic heart disease and other diseases of the circulatory system: Secondary | ICD-10-CM | POA: Diagnosis not present

## 2018-09-17 DIAGNOSIS — M25552 Pain in left hip: Secondary | ICD-10-CM | POA: Diagnosis not present

## 2018-09-17 DIAGNOSIS — R0602 Shortness of breath: Secondary | ICD-10-CM | POA: Diagnosis not present

## 2018-09-17 DIAGNOSIS — M25561 Pain in right knee: Secondary | ICD-10-CM | POA: Diagnosis not present

## 2018-09-17 DIAGNOSIS — G4733 Obstructive sleep apnea (adult) (pediatric): Secondary | ICD-10-CM | POA: Diagnosis not present

## 2018-09-17 DIAGNOSIS — M25551 Pain in right hip: Secondary | ICD-10-CM | POA: Diagnosis not present

## 2018-09-17 DIAGNOSIS — I129 Hypertensive chronic kidney disease with stage 1 through stage 4 chronic kidney disease, or unspecified chronic kidney disease: Secondary | ICD-10-CM | POA: Diagnosis not present

## 2018-09-17 DIAGNOSIS — M1990 Unspecified osteoarthritis, unspecified site: Secondary | ICD-10-CM | POA: Diagnosis not present

## 2018-09-17 DIAGNOSIS — R5383 Other fatigue: Secondary | ICD-10-CM | POA: Diagnosis not present

## 2018-09-17 DIAGNOSIS — R269 Unspecified abnormalities of gait and mobility: Secondary | ICD-10-CM | POA: Diagnosis not present

## 2018-09-17 DIAGNOSIS — Z7901 Long term (current) use of anticoagulants: Secondary | ICD-10-CM | POA: Diagnosis not present

## 2018-09-17 DIAGNOSIS — Z888 Allergy status to other drugs, medicaments and biological substances status: Secondary | ICD-10-CM | POA: Diagnosis not present

## 2018-09-17 DIAGNOSIS — M25562 Pain in left knee: Secondary | ICD-10-CM | POA: Diagnosis not present

## 2018-09-17 DIAGNOSIS — R202 Paresthesia of skin: Secondary | ICD-10-CM | POA: Diagnosis not present

## 2018-09-17 DIAGNOSIS — N183 Chronic kidney disease, stage 3 (moderate): Secondary | ICD-10-CM | POA: Diagnosis not present

## 2018-09-17 DIAGNOSIS — E1122 Type 2 diabetes mellitus with diabetic chronic kidney disease: Secondary | ICD-10-CM | POA: Diagnosis not present

## 2018-09-17 DIAGNOSIS — R200 Anesthesia of skin: Secondary | ICD-10-CM | POA: Diagnosis not present

## 2018-09-17 DIAGNOSIS — Z954 Presence of other heart-valve replacement: Secondary | ICD-10-CM | POA: Diagnosis not present

## 2018-09-17 DIAGNOSIS — Z7984 Long term (current) use of oral hypoglycemic drugs: Secondary | ICD-10-CM | POA: Diagnosis not present

## 2018-09-17 DIAGNOSIS — R42 Dizziness and giddiness: Secondary | ICD-10-CM | POA: Diagnosis not present

## 2018-09-17 DIAGNOSIS — Z880 Allergy status to penicillin: Secondary | ICD-10-CM | POA: Diagnosis not present

## 2018-09-17 DIAGNOSIS — R197 Diarrhea, unspecified: Secondary | ICD-10-CM | POA: Diagnosis not present

## 2018-09-17 DIAGNOSIS — Z7982 Long term (current) use of aspirin: Secondary | ICD-10-CM | POA: Diagnosis not present

## 2018-09-17 NOTE — Telephone Encounter (Signed)
New Message   Pt c/o swelling: STAT is pt has developed SOB within 24 hours  1) How much weight have you gained and in what time span? 4lbs since 09/14/2018  2) If swelling, where is the swelling located? Legs, feet and lower belly   3) Are you currently taking a fluid pill? yes  4) Are you currently SOB? Yes, when she moves around. She had sob for about a week.    5) Do you have a log of your daily weights (if so, list)? Not sure how much she weighed on Friday they told her at rehab she gained 4lbs since Friday   6) Have you gained 3 pounds in a day or 5 pounds in a week? 4lbs since Friday   7) Have you traveled recently? no

## 2018-09-17 NOTE — Telephone Encounter (Signed)
Returned pt call. Pt sts that she has gained 4lbs since 09/14/18 and is more sob with activity. Her LE edema is stable, she does have increased swelling in her abdomen. She denies orthopnea. On 09/06/18 her lasix was reduced by Almyra Deforest, PA from 20mg  bid to 20mg  daily due to worsening kidney function She did have her repeat BMP on 09/13/18, adv her that Isaac Laud has not had the opportunity to review as yet. She is scheduled to see Dr.Hochrein in [place] on 09/19/18. Adv pt that her creatinine is slightly improved from her labs 12days ago but has not returned to her baseline. Adv pt that she can take an extra 20mg  of Lasix this afternoon. I will fwd an update to Dr.Hochrein for further recommendation. She should f/u as planned with him on Wed in [place]. Pt agreeable with plan and voiced appreciation for the call back.

## 2018-09-18 NOTE — Progress Notes (Signed)
HPI The patient has follow up of AS.   She is status post TAVR.   Since I last saw her she has had continued problems with fluid retention.  Management is compounded by renal insufficiency.  She has had intermittent increased diuretic.  Her BNP recently was very mildly elevated and lower than previous.  Her Creat is 2.02 which is not quite at baseline but lower than 2 weeks prior.    Despite having frequent office visits and adjustments to her medication she continues to have weight gain.  Is about 40 pounds since November.  She is gained 10 pounds in 1 week per the home health nurse.  She has increased abdominal girth.  She has increased lower extremity swelling.  She is describing PND and orthopnea.  She is having dyspnea with mild exertion.  She is not noticing any new palpitations, presyncope or syncope.  She is not having any chest pressure, neck or arm discomfort.  She is not watching her salt in particular.  She eats [place] fries and onion rings.  She was advised about this and she apparently does reduce her fluid intake but I am not entirely clear on this.   Allergies  Allergen Reactions  . Amlodipine   . Atorvastatin Other (See Comments)    Myalgias   . Crestor [Rosuvastatin Calcium] Other (See Comments)    Stiffness and back pain  . Ezetimibe-Simvastatin Other (See Comments)    Leg cramps  . Statins Other (See Comments)    Myalgias  . Celebrex [Celecoxib] Rash  . Levemir [Insulin Detemir] Rash  . Penicillins Other (See Comments)    SYNCOPE (Tolerated Ancef) Has patient had a PCN reaction causing immediate rash, facial/tongue/throat swelling, SOB or lightheadedness with hypotension: Yes Has patient had a PCN reaction causing severe rash involving mucus membranes or skin necrosis: No Has patient had a PCN reaction that required hospitalization No Has patient had a PCN reaction occurring within the last 10 years: No If all of the above answers are "NO", then may proceed with  Cephalosporin use.    No current outpatient medications on file.   No current facility-administered medications for this visit.     Past Medical History:  Diagnosis Date  . Atrial fibrillation ([place])    a. maintaining sinus s/p DCCV, on Eliquis  . CAD (coronary artery disease)    a. Nonobstructive by cath 2006 - 25% LAD, 25-30% after 1st diag, distal 25% PDA, minor irreg LCx. b. Normal nuc 2011.  . CKD (chronic kidney disease), stage III ([place])   . Cystocele   . Difficult intubation 09-07-2012   big neck trouble with intubation parotid surgery"takes little med to sedate"  . Esophageal stricture   . GERD (gastroesophageal reflux disease)   . Hiatal hernia    a. 07/2013: HH with small stricture holding up barium tablet (concides with patient's sx of food sticking).  . History of kidney stones   . Hyperlipidemia   . Hypertension   . IBS (irritable bowel syndrome)   . Memory difficulty 11/26/2015  . Morbid obesity ([place])    a. hx of gastric bypass (sleeve gastrectomy 2015)  . Osteoporosis   . 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  [facility] Kitchen Pericardial effusion    a. Mod by echo 2012 at time of PNA. b. Echo 07/2012: small pericardial effusion vs fat.  . S/P TAVR (transcatheter aortic valve replacement)   . Severe aortic stenosis   .  Sleep apnea     "have mask; don't use it" (02/14/2018)  . TIA (transient ischemic attack) 10/2014; 06/2016   "some memory issues since; daughter says my speech is sometimes different" (02/14/2018)  . Type II diabetes mellitus ([place])     Past Surgical History:  Procedure Laterality Date  . BREATH TEK H PYLORI N/A 07/29/2013   Procedure: [place];  Surgeon: Shann Medal, MD;  Location: [facility];  Service: General;  Laterality: N/A;  . CARDIAC CATHETERIZATION  2006  . CARDIOVERSION N/A 02/16/2018   Procedure: CARDIOVERSION;  Surgeon: Pixie Casino, MD;  Location: [facility];  Service: Cardiovascular;  Laterality: N/A;  .  CARPAL TUNNEL RELEASE Left 2011  . CATARACT EXTRACTION W/ INTRAOCULAR LENS  IMPLANT, BILATERAL Bilateral 2003   left  . CHOLECYSTECTOMY OPEN  1982  . COLONOSCOPY N/A 07/14/2015   Procedure: COLONOSCOPY;  Surgeon: Ladene Artist, MD;  Location: [facility];  Service: Endoscopy;  Laterality: N/A;  . ESOPHAGOGASTRODUODENOSCOPY N/A 12/27/2013   Procedure: ESOPHAGOGASTRODUODENOSCOPY (EGD);  Surgeon: Shann Medal, MD;  Location: [facility];  Service: General;  Laterality: N/A;  . ESOPHAGOGASTRODUODENOSCOPY (EGD) WITH ESOPHAGEAL DILATION    . INTRAOPERATIVE TRANSTHORACIC ECHOCARDIOGRAM  05/08/2018   Procedure: INTRAOPERATIVE TRANSTHORACIC ECHOCARDIOGRAM;  Surgeon: Burnell Blanks, MD;  Location: [facility];  Service: Open Heart Surgery;;  . LAPAROSCOPIC GASTRIC SLEEVE RESECTION N/A 01/20/2014   Procedure: LAPAROSCOPIC GASTRIC SLEEVE RESECTION;  Surgeon: Shann Medal, MD;  Location: [facility];  Service: General;  Laterality: N/A;  . PAROTIDECTOMY  09/07/2012   Procedure: PAROTIDECTOMY;  Surgeon: Melida Quitter, MD;  Location: [facility];  Service: ENT;  Laterality: Left;  LEFT PAROTIDECTOMY  . RIGHT/LEFT HEART CATH AND CORONARY ANGIOGRAPHY N/A 03/29/2018   Procedure: RIGHT/LEFT HEART CATH AND CORONARY ANGIOGRAPHY;  Surgeon: Burnell Blanks, MD;  Location: [facility];  Service: Cardiovascular;  Laterality: N/A;  . TEE WITHOUT CARDIOVERSION N/A 02/16/2018   Procedure: TRANSESOPHAGEAL ECHOCARDIOGRAM (TEE);  Surgeon: Pixie Casino, MD;  Location: [facility];  Service: Cardiovascular;  Laterality: N/A;  . TOE AMPUTATION Left 07/2017   great toe; [facility]   . TONSILLECTOMY  1968  . TRANSCATHETER AORTIC VALVE REPLACEMENT, TRANSFEMORAL  05/08/2018   Transcatheter Aortic Valve Replacement   . TRANSCATHETER AORTIC VALVE REPLACEMENT, TRANSFEMORAL N/A 05/08/2018   Procedure: TRANSCATHETER AORTIC VALVE REPLACEMENT, TRANSFEMORAL;  Surgeon: Burnell Blanks, MD;  Location: [facility];  Service: Open  Heart Surgery;  Laterality: N/A;  . TUBAL LIGATION  yrs ago  . UPPER GI ENDOSCOPY  01/20/2014   Procedure: UPPER GI ENDOSCOPY;  Surgeon: Shann Medal, MD;  Location: [facility];  Service: General;;   Family History  Problem Relation Age of Onset  . Heart failure Mother   . Hypertension Mother   . Skin cancer Mother   . Colitis Mother   . Diabetes Mother   . Kidney Stones Mother   . Stroke Mother   . Heart disease Father   . Colon polyps Father   . Diabetes Father   . Heart failure Father   . Heart attack Sister   . Diabetes Other   . Stroke Maternal Grandfather   . Diabetes Maternal Grandfather   . Colon cancer Neg Hx    Social History   Socioeconomic History  . Marital status: Married    Spouse name: Not on file  . Number of children: 3  . Years of education: some college  . Highest education level: Some college, no  degree  Occupational History  . Occupation: retired    Fish farm manager: [employer]  . Financial resource strain: Not hard at all  . Food insecurity:    Worry: Never true    Inability: Never true  . Transportation needs:    Medical: No    Non-medical: No  Tobacco Use  . Smoking status: Never Smoker  . Smokeless tobacco: Never Used  Substance and Sexual Activity  . Alcohol use: Yes    Alcohol/week: 0.0 standard drinks    Comment:  "mixed drink q few years"  . Drug use: Never  . Sexual activity: Yes    Birth control/protection: Post-menopausal  Lifestyle  . Physical activity:    Days per week: 0 days    Minutes per session: 0 min  . Stress: Only a little  Relationships  . Social connections:    Talks on phone: More than three times a week    Gets together: More than three times a week    Attends religious service: More than 4 times per year    Active member of club or organization: Yes    Attends meetings of clubs or organizations: More than 4 times per year    Relationship status: Married  Other Topics Concern  . Not on file    Social History Narrative   Married   Patient is right handed.   Patient rarely drinks caffeine.   Lives at home with husband in two story home. Husband washes laundry in the basement. They have 3 daughters and 7 grandchildren.     ROS:  As stated in the HPI and negative for all other systems.   PHYSICAL EXAM BP 120/60   Pulse (!) 56   Ht 5\' 7"  (1.702 m)   Wt 248 lb 4 oz (112.6 kg)   BMI 38.88 kg/m   GENERAL:  Well appearing HEENT:  Pupils equal round and reactive, fundi not visualized, oral mucosa unremarkable NECK: Unable to assess jugular venous distention secondary to the size of her neck, waveform within normal limits, carotid upstroke brisk and symmetric, no bruits, no thyromegaly LYMPHATICS:  No cervical, inguinal adenopathy LUNGS:  Clear to auscultation bilaterally BACK:  No CVA tenderness CHEST:  Unremarkable HEART:  PMI not displaced or sustained,S1 and S2 within normal limits, no S3, no S4, no clicks, no rubs, 2 out of 6 apical systolic murmur nonradiating, no diastolic murmurs ABD: Obese and distended, positive bowel sounds normal in frequency in pitch, no bruits, no rebound, no guarding, no midline pulsatile mass, no hepatomegaly, no splenomegaly EXT:  2 plus pulses throughout, moderate lateral lower extremity edema, no cyanosis no clubbing SKIN:  No rashes no nodules questional erythema in the right anterior tibial NEURO:  Cranial nerves II through XII grossly intact, motor grossly intact throughout [facility]:  Cognitively intact, oriented to person place and time   EKG:   06/15/2018 sinus rhythm with a short PR interval, rate 84, axis within normal limits, intervals within normal limits, poor anterior R wave progression, diffuse inferolateral ST depression unchanged from previous.  ASSESSMENT AND PLAN   Severe AS s/p TAVR:    This was assessed with echo in November and seems to be functioning properly.  However, she will be reassessed as below.  Acute on chronic  diastolic CHF:    She has worsening acute on chronic diastolic heart failure.  This is despite frequent med adjustments.  Compounding this is her renal insufficiency.  At this point she needs to  be admitted to the hospital for further work-up.  I did suggest CT of her abdomen or ultrasound to look for ascites.  She might need paracentesis.  I would repeat an echocardiogram to look for increasing effusion.  A day also concerned about diastolic dysfunction with possible restrictive physiology.  She might need right heart catheterization.  I will make arrangements today to admit her to the hospital for this work-up and further IV diuresis as tolerated.  HTN:   Pressures controlled.  Meds will be adjusted as dictated by the work-up above.  Atrial fibrillation:  She is maintaining sinus rhythm.  I am going to stop her amiodarone.  She continues with anticoagulation.  Chronic kidney disease, stage 3:  Creatinine is 2.02 most recently which is slightly above her baseline but better than the labs 2 weeks ago.  This will be followed closely.  Moderate pericardial effusion:  She will need a repeat echo as above.

## 2018-09-18 NOTE — Telephone Encounter (Signed)
Agree with the extra Lasix and I will see her tomorrow in the clinic.

## 2018-09-19 ENCOUNTER — Ambulatory Visit (INDEPENDENT_AMBULATORY_CARE_PROVIDER_SITE_OTHER): Payer: Medicare Other | Admitting: Cardiology

## 2018-09-19 ENCOUNTER — Other Ambulatory Visit (HOSPITAL_COMMUNITY): Payer: Medicare Other

## 2018-09-19 ENCOUNTER — Encounter: Payer: Self-pay | Admitting: Cardiology

## 2018-09-19 ENCOUNTER — Inpatient Hospital Stay (HOSPITAL_COMMUNITY): Payer: Medicare Other

## 2018-09-19 ENCOUNTER — Inpatient Hospital Stay (HOSPITAL_COMMUNITY)
Admission: AD | Admit: 2018-09-19 | Discharge: 2018-09-28 | DRG: 286 | Disposition: A | Discharge status: Home Health | Payer: Medicare Other | Source: Ambulatory Visit | Attending: Cardiology | Admitting: Cardiology

## 2018-09-19 VITALS — BP 120/60 | HR 56 | Ht 67.0 in | Wt 248.2 lb

## 2018-09-19 DIAGNOSIS — I2510 Atherosclerotic heart disease of native coronary artery without angina pectoris: Secondary | ICD-10-CM

## 2018-09-19 DIAGNOSIS — I3139 Other pericardial effusion (noninflammatory): Secondary | ICD-10-CM | POA: Diagnosis present

## 2018-09-19 DIAGNOSIS — Z823 Family history of stroke: Secondary | ICD-10-CM

## 2018-09-19 DIAGNOSIS — I4891 Unspecified atrial fibrillation: Secondary | ICD-10-CM | POA: Diagnosis not present

## 2018-09-19 DIAGNOSIS — Z794 Long term (current) use of insulin: Secondary | ICD-10-CM | POA: Diagnosis not present

## 2018-09-19 DIAGNOSIS — Z953 Presence of xenogenic heart valve: Secondary | ICD-10-CM | POA: Diagnosis not present

## 2018-09-19 DIAGNOSIS — I313 Pericardial effusion (noninflammatory): Secondary | ICD-10-CM

## 2018-09-19 DIAGNOSIS — N183 Chronic kidney disease, stage 3 unspecified: Secondary | ICD-10-CM

## 2018-09-19 DIAGNOSIS — I2722 Pulmonary hypertension due to left heart disease: Secondary | ICD-10-CM | POA: Diagnosis present

## 2018-09-19 DIAGNOSIS — R601 Generalized edema: Secondary | ICD-10-CM | POA: Diagnosis not present

## 2018-09-19 DIAGNOSIS — I5033 Acute on chronic diastolic (congestive) heart failure: Secondary | ICD-10-CM | POA: Diagnosis not present

## 2018-09-19 DIAGNOSIS — E854 Organ-limited amyloidosis: Secondary | ICD-10-CM | POA: Diagnosis not present

## 2018-09-19 DIAGNOSIS — Z9119 Patient's noncompliance with other medical treatment and regimen: Secondary | ICD-10-CM | POA: Diagnosis not present

## 2018-09-19 DIAGNOSIS — Z9884 Bariatric surgery status: Secondary | ICD-10-CM | POA: Diagnosis not present

## 2018-09-19 DIAGNOSIS — N184 Chronic kidney disease, stage 4 (severe): Secondary | ICD-10-CM | POA: Diagnosis not present

## 2018-09-19 DIAGNOSIS — M810 Age-related osteoporosis without current pathological fracture: Secondary | ICD-10-CM | POA: Diagnosis present

## 2018-09-19 DIAGNOSIS — I10 Essential (primary) hypertension: Secondary | ICD-10-CM | POA: Diagnosis present

## 2018-09-19 DIAGNOSIS — E785 Hyperlipidemia, unspecified: Secondary | ICD-10-CM | POA: Diagnosis present

## 2018-09-19 DIAGNOSIS — R42 Dizziness and giddiness: Secondary | ICD-10-CM | POA: Diagnosis not present

## 2018-09-19 DIAGNOSIS — R188 Other ascites: Secondary | ICD-10-CM | POA: Diagnosis not present

## 2018-09-19 DIAGNOSIS — I43 Cardiomyopathy in diseases classified elsewhere: Secondary | ICD-10-CM | POA: Diagnosis present

## 2018-09-19 DIAGNOSIS — Z9111 Patient's noncompliance with dietary regimen: Secondary | ICD-10-CM | POA: Diagnosis not present

## 2018-09-19 DIAGNOSIS — Z888 Allergy status to other drugs, medicaments and biological substances status: Secondary | ICD-10-CM

## 2018-09-19 DIAGNOSIS — G4733 Obstructive sleep apnea (adult) (pediatric): Secondary | ICD-10-CM | POA: Diagnosis present

## 2018-09-19 DIAGNOSIS — Z961 Presence of intraocular lens: Secondary | ICD-10-CM | POA: Diagnosis present

## 2018-09-19 DIAGNOSIS — Z808 Family history of malignant neoplasm of other organs or systems: Secondary | ICD-10-CM

## 2018-09-19 DIAGNOSIS — Z6837 Body mass index (BMI) 37.0-37.9, adult: Secondary | ICD-10-CM

## 2018-09-19 DIAGNOSIS — Z8673 Personal history of transient ischemic attack (TIA), and cerebral infarction without residual deficits: Secondary | ICD-10-CM

## 2018-09-19 DIAGNOSIS — L03115 Cellulitis of right lower limb: Secondary | ICD-10-CM | POA: Diagnosis present

## 2018-09-19 DIAGNOSIS — Z8249 Family history of ischemic heart disease and other diseases of the circulatory system: Secondary | ICD-10-CM

## 2018-09-19 DIAGNOSIS — Z952 Presence of prosthetic heart valve: Secondary | ICD-10-CM | POA: Diagnosis not present

## 2018-09-19 DIAGNOSIS — I480 Paroxysmal atrial fibrillation: Secondary | ICD-10-CM | POA: Diagnosis present

## 2018-09-19 DIAGNOSIS — N179 Acute kidney failure, unspecified: Secondary | ICD-10-CM

## 2018-09-19 DIAGNOSIS — Z6838 Body mass index (BMI) 38.0-38.9, adult: Secondary | ICD-10-CM | POA: Diagnosis not present

## 2018-09-19 DIAGNOSIS — Z833 Family history of diabetes mellitus: Secondary | ICD-10-CM

## 2018-09-19 DIAGNOSIS — I509 Heart failure, unspecified: Secondary | ICD-10-CM | POA: Diagnosis not present

## 2018-09-19 DIAGNOSIS — N189 Chronic kidney disease, unspecified: Secondary | ICD-10-CM | POA: Diagnosis present

## 2018-09-19 DIAGNOSIS — Z880 Allergy status to penicillin: Secondary | ICD-10-CM

## 2018-09-19 DIAGNOSIS — I130 Hypertensive heart and chronic kidney disease with heart failure and stage 1 through stage 4 chronic kidney disease, or unspecified chronic kidney disease: Principal | ICD-10-CM | POA: Diagnosis present

## 2018-09-19 DIAGNOSIS — Z79899 Other long term (current) drug therapy: Secondary | ICD-10-CM

## 2018-09-19 DIAGNOSIS — E1122 Type 2 diabetes mellitus with diabetic chronic kidney disease: Secondary | ICD-10-CM | POA: Diagnosis present

## 2018-09-19 DIAGNOSIS — Z8371 Family history of colonic polyps: Secondary | ICD-10-CM

## 2018-09-19 DIAGNOSIS — E1129 Type 2 diabetes mellitus with other diabetic kidney complication: Secondary | ICD-10-CM | POA: Diagnosis present

## 2018-09-19 DIAGNOSIS — R112 Nausea with vomiting, unspecified: Secondary | ICD-10-CM | POA: Diagnosis not present

## 2018-09-19 DIAGNOSIS — Z87442 Personal history of urinary calculi: Secondary | ICD-10-CM

## 2018-09-19 DIAGNOSIS — K5900 Constipation, unspecified: Secondary | ICD-10-CM | POA: Diagnosis not present

## 2018-09-19 DIAGNOSIS — R1904 Left lower quadrant abdominal swelling, mass and lump: Secondary | ICD-10-CM | POA: Diagnosis present

## 2018-09-19 DIAGNOSIS — Z7901 Long term (current) use of anticoagulants: Secondary | ICD-10-CM

## 2018-09-19 DIAGNOSIS — E1159 Type 2 diabetes mellitus with other circulatory complications: Secondary | ICD-10-CM | POA: Diagnosis present

## 2018-09-19 DIAGNOSIS — Z9049 Acquired absence of other specified parts of digestive tract: Secondary | ICD-10-CM

## 2018-09-19 DIAGNOSIS — Z9842 Cataract extraction status, left eye: Secondary | ICD-10-CM

## 2018-09-19 DIAGNOSIS — Z9841 Cataract extraction status, right eye: Secondary | ICD-10-CM

## 2018-09-19 LAB — MAGNESIUM: Magnesium: 1.7 mg/dL (ref 1.7–2.4)

## 2018-09-19 LAB — CBC WITH DIFFERENTIAL/PLATELET
Abs Immature Granulocytes: 0.01 10*3/uL (ref 0.00–0.07)
Basophils Absolute: 0.1 10*3/uL (ref 0.0–0.1)
Basophils Relative: 1 %
Eosinophils Absolute: 0.4 10*3/uL (ref 0.0–0.5)
Eosinophils Relative: 5 %
HCT: 36.1 % (ref 36.0–46.0)
Hemoglobin: 11.4 g/dL — ABNORMAL LOW (ref 12.0–15.0)
Immature Granulocytes: 0 %
Lymphocytes Relative: 22 %
Lymphs Abs: 1.6 10*3/uL (ref 0.7–4.0)
MCH: 26.5 pg (ref 26.0–34.0)
MCHC: 31.6 g/dL (ref 30.0–36.0)
MCV: 84 fL (ref 80.0–100.0)
Monocytes Absolute: 0.7 10*3/uL (ref 0.1–1.0)
Monocytes Relative: 9 %
Neutro Abs: 4.6 10*3/uL (ref 1.7–7.7)
Neutrophils Relative %: 63 %
Platelets: 129 10*3/uL — ABNORMAL LOW (ref 150–400)
RBC: 4.3 MIL/uL (ref 3.87–5.11)
RDW: 15.7 % — ABNORMAL HIGH (ref 11.5–15.5)
WBC: 7.2 10*3/uL (ref 4.0–10.5)
nRBC: 0 % (ref 0.0–0.2)

## 2018-09-19 LAB — COMPREHENSIVE METABOLIC PANEL
ALT: 24 U/L (ref 0–44)
AST: 17 U/L (ref 15–41)
Albumin: 2.8 g/dL — ABNORMAL LOW (ref 3.5–5.0)
Alkaline Phosphatase: 57 U/L (ref 38–126)
Anion gap: 10 (ref 5–15)
BUN: 34 mg/dL — ABNORMAL HIGH (ref 8–23)
CO2: 26 mmol/L (ref 22–32)
Calcium: 8.7 mg/dL — ABNORMAL LOW (ref 8.9–10.3)
Chloride: 110 mmol/L (ref 98–111)
Creatinine, Ser: 2.08 mg/dL — ABNORMAL HIGH (ref 0.44–1.00)
GFR calc Af Amer: 28 mL/min — ABNORMAL LOW (ref >60)
GFR calc non Af Amer: 24 mL/min — ABNORMAL LOW (ref >60)
Glucose, Bld: 227 mg/dL — ABNORMAL HIGH (ref 70–99)
Potassium: 4.4 mmol/L (ref 3.5–5.1)
Sodium: 146 mmol/L — ABNORMAL HIGH (ref 135–145)
Total Bilirubin: 0.7 mg/dL (ref 0.3–1.2)
Total Protein: 5.5 g/dL — ABNORMAL LOW (ref 6.5–8.1)

## 2018-09-19 LAB — GLUCOSE, CAPILLARY
Glucose-Capillary: 237 mg/dL — ABNORMAL HIGH (ref 70–99)
Glucose-Capillary: 154 mg/dL — ABNORMAL HIGH (ref 70–99)
Glucose-Capillary: 118 mg/dL — ABNORMAL HIGH (ref 70–99)

## 2018-09-19 LAB — HEMOGLOBIN A1C
Hgb A1c MFr Bld: 10.3 % — ABNORMAL HIGH (ref 4.8–5.6)
Mean Plasma Glucose: 248.91 mg/dL

## 2018-09-19 MED ORDER — NITROGLYCERIN 0.4 MG SL SUBL: 0.4000 mg | SUBLINGUAL_TABLET | SUBLINGUAL | Status: DC | PRN

## 2018-09-19 MED ORDER — INSULIN ASPART 100 UNIT/ML ~~LOC~~ SOLN
0.0000 [IU] | Freq: Three times a day (TID) | SUBCUTANEOUS | Status: DC
  Administered 2018-09-19: 3 [IU] via SUBCUTANEOUS
  Administered 2018-09-20 (×2): 2 [IU] via SUBCUTANEOUS
  Administered 2018-09-20 – 2018-09-21 (×3): 3 [IU] via SUBCUTANEOUS
  Administered 2018-09-21: 5 [IU] via SUBCUTANEOUS
  Administered 2018-09-22: 8 [IU] via SUBCUTANEOUS
  Administered 2018-09-22: 15 [IU] via SUBCUTANEOUS
  Administered 2018-09-22: 5 [IU] via SUBCUTANEOUS
  Administered 2018-09-23: 15 [IU] via SUBCUTANEOUS
  Administered 2018-09-23: 11 [IU] via SUBCUTANEOUS
  Administered 2018-09-23 – 2018-09-24 (×2): 8 [IU] via SUBCUTANEOUS
  Administered 2018-09-24 (×2): 15 [IU] via SUBCUTANEOUS
  Administered 2018-09-25: 5 [IU] via SUBCUTANEOUS
  Administered 2018-09-25: 11 [IU] via SUBCUTANEOUS

## 2018-09-19 MED ORDER — INSULIN ASPART 100 UNIT/ML ~~LOC~~ SOLN: 0.0000 [IU] | Freq: Three times a day (TID) | SUBCUTANEOUS | Status: DC

## 2018-09-19 MED ORDER — CALCIUM CARBONATE-VITAMIN D 600-400 MG-UNIT PO TABS: 1.0000 | ORAL_TABLET | ORAL | Status: DC

## 2018-09-19 MED ORDER — PRENATAL MULTIVITAMIN CH
1.0000 | ORAL_TABLET | ORAL | Status: DC
  Administered 2018-09-21 – 2018-09-28 (×4): 1 via ORAL
  Filled 2018-09-19 (×5): qty 1

## 2018-09-19 MED ORDER — INSULIN ASPART 100 UNIT/ML ~~LOC~~ SOLN
15.0000 [IU] | Freq: Every day | SUBCUTANEOUS | Status: DC
  Administered 2018-09-20 – 2018-09-28 (×8): 15 [IU] via SUBCUTANEOUS

## 2018-09-19 MED ORDER — ZOLPIDEM TARTRATE 5 MG PO TABS: 5.0000 mg | ORAL_TABLET | Freq: Every evening | ORAL | Status: DC | PRN

## 2018-09-19 MED ORDER — SODIUM CHLORIDE 0.9% FLUSH
3.0000 mL | Freq: Two times a day (BID) | INTRAVENOUS | Status: DC
  Administered 2018-09-19 – 2018-09-28 (×13): 3 mL via INTRAVENOUS

## 2018-09-19 MED ORDER — INSULIN ASPART PROT & ASPART (70-30 MIX) 100 UNIT/ML ~~LOC~~ SUSP
30.0000 [IU] | Freq: Two times a day (BID) | SUBCUTANEOUS | Status: DC
  Administered 2018-09-19 – 2018-09-24 (×10): 30 [IU] via SUBCUTANEOUS
  Filled 2018-09-19: qty 10

## 2018-09-19 MED ORDER — PROPYLENE GLYCOL 0.6 % OP SOLN: 1.0000 [drp] | Freq: Every day | OPHTHALMIC | Status: DC | PRN

## 2018-09-19 MED ORDER — CARVEDILOL 25 MG PO TABS
25.0000 mg | ORAL_TABLET | Freq: Two times a day (BID) | ORAL | Status: DC
  Administered 2018-09-19 – 2018-09-28 (×18): 25 mg via ORAL
  Filled 2018-09-19: qty 2
  Filled 2018-09-19 (×17): qty 1

## 2018-09-19 MED ORDER — FUROSEMIDE 10 MG/ML IJ SOLN
40.0000 mg | Freq: Two times a day (BID) | INTRAMUSCULAR | Status: DC
  Administered 2018-09-19 – 2018-09-21 (×4): 40 mg via INTRAVENOUS
  Filled 2018-09-19 (×4): qty 4

## 2018-09-19 MED ORDER — APIXABAN 5 MG PO TABS
5.0000 mg | ORAL_TABLET | Freq: Two times a day (BID) | ORAL | Status: DC
  Administered 2018-09-19 – 2018-09-23 (×9): 5 mg via ORAL
  Filled 2018-09-19 (×9): qty 1

## 2018-09-19 MED ORDER — POLYVINYL ALCOHOL 1.4 % OP SOLN
1.0000 [drp] | Freq: Every day | OPHTHALMIC | Status: DC | PRN
  Filled 2018-09-19: qty 15

## 2018-09-19 MED ORDER — SODIUM CHLORIDE 0.9% FLUSH: 3.0000 mL | INTRAVENOUS | Status: DC | PRN

## 2018-09-19 MED ORDER — NON FORMULARY: 4.0000 mg | Freq: Every day | Status: DC

## 2018-09-19 MED ORDER — ALPRAZOLAM 0.25 MG PO TABS: 0.2500 mg | ORAL_TABLET | Freq: Two times a day (BID) | ORAL | Status: DC | PRN

## 2018-09-19 MED ORDER — ONDANSETRON HCL 4 MG/2ML IJ SOLN
4.0000 mg | Freq: Four times a day (QID) | INTRAMUSCULAR | Status: DC | PRN
  Administered 2018-09-24: 4 mg via INTRAVENOUS
  Filled 2018-09-19: qty 2

## 2018-09-19 MED ORDER — INSULIN ASPART 100 UNIT/ML ~~LOC~~ SOLN
0.0000 [IU] | Freq: Every day | SUBCUTANEOUS | Status: DC
  Administered 2018-09-20: 2 [IU] via SUBCUTANEOUS
  Administered 2018-09-21: 3 [IU] via SUBCUTANEOUS
  Administered 2018-09-22: 4 [IU] via SUBCUTANEOUS
  Administered 2018-09-23: 5 [IU] via SUBCUTANEOUS
  Administered 2018-09-24: 3 [IU] via SUBCUTANEOUS
  Administered 2018-09-25: 4 [IU] via SUBCUTANEOUS
  Administered 2018-09-26: 3 [IU] via SUBCUTANEOUS
  Administered 2018-09-27: 2 [IU] via SUBCUTANEOUS

## 2018-09-19 MED ORDER — FUROSEMIDE 10 MG/ML IJ SOLN
40.0000 mg | Freq: Once | INTRAMUSCULAR | Status: AC
  Administered 2018-09-19: 40 mg via INTRAVENOUS
  Filled 2018-09-19: qty 4

## 2018-09-19 MED ORDER — HYDRALAZINE HCL 10 MG PO TABS
10.0000 mg | ORAL_TABLET | Freq: Three times a day (TID) | ORAL | Status: DC
  Administered 2018-09-19 (×2): 10 mg via ORAL
  Filled 2018-09-19 (×3): qty 1

## 2018-09-19 MED ORDER — ACETAMINOPHEN 325 MG PO TABS: 650.0000 mg | ORAL_TABLET | ORAL | Status: DC | PRN

## 2018-09-19 MED ORDER — MECLIZINE HCL 25 MG PO TABS
12.5000 mg | ORAL_TABLET | Freq: Three times a day (TID) | ORAL | Status: DC | PRN
  Administered 2018-09-24: 12.5 mg via ORAL
  Filled 2018-09-19: qty 1

## 2018-09-19 MED ORDER — SODIUM CHLORIDE 0.9 % IV SOLN: 250.0000 mL | INTRAVENOUS | Status: DC | PRN

## 2018-09-19 MED ORDER — SALINE SPRAY 0.65 % NA SOLN
1.0000 | NASAL | Status: DC | PRN
  Filled 2018-09-19: qty 44

## 2018-09-19 MED ORDER — CALCIUM CARBONATE-VITAMIN D 500-200 MG-UNIT PO TABS
1.0000 | ORAL_TABLET | ORAL | Status: DC
  Administered 2018-09-21 – 2018-09-28 (×4): 1 via ORAL
  Filled 2018-09-19 (×7): qty 1

## 2018-09-19 NOTE — Consult Note (Addendum)
Advanced Heart Failure Team Consult Note   Primary Physician: Chipper Herb, MD PCP-Cardiologist:  Minus Breeding, MD  Reason for Consultation: Acute on chronic diastolic CHF  HPI:    [name] is seen today for evaluation of acute on chronic diastolic CHF at the request of Dr. Percival Spanish.   [name] is a 68 y.o. female with severe AS s/p TAVR, chronic diastolic CHF, PAF, CKD III, HLD, HTN, OSA, and morbid obesity  Seen in Dr. Rosezella [place] office 09/19/2018 and admitted with marked volume overload. Has gained approx 40 lbs since November. Noted to have abdominal distention and dyspnea with mild exertion. Denies palpitations, presyncope, or syncope. No chest pain. Reported dietary non-compliance. Sent to [facility] for admission and further evaluation. Pertinent labs on admission include Cr 2.08, K 4.4, Mg 1.7, WBC 7.2, Hgb 11.4, and Hgb A1c 10.3.   Remains SOB with minimal exertion. She has edema nearly up to her breasts. Cellulitis vs chronic venous stasis changes to RLE. She has been gradually worsening since November. For the past 2-3 months, she has been short of breath with minimal exertion and orthopneic. Has significant edema in her legs. Was on CPAP several years ago, but couldn't tolerate the mask. Denies CP or palpitations. Sleeps on 2-3 pillows. Noticed increased UOP on IV lasix.   Echo 06/21/2018 LVEF 65-70%, Grade 2 DD, Stable bioprosthetic AVR, Trivial MS, small to mod pericardial effusion.   LHC 03/2018  Ost RCA to Prox RCA lesion is 50% stenosed.  Mid RCA lesion is 40% stenosed.  Mid RCA to Dist RCA lesion is 20% stenosed.  Ost RPDA to RPDA lesion is 40% stenosed.  Ost Cx lesion is 50% stenosed.  Prox Cx lesion is 65% stenosed.  Mid Cx lesion is 70% stenosed.  Ost 3rd Mrg lesion is 20% stenosed.  Mid LM to Dist LM lesion is 25% stenosed.  Mid LAD to Dist LAD lesion is 99% stenosed.   (Too small for PCI)  Ost LAD to Prox LAD lesion is 20%  stenosed.  Hemodynamic findings consistent with moderate pulmonary hypertension.  Review of systems complete and found to be negative unless listed in HPI.    Home Medications Prior to Admission medications   Medication Sig Start Date End Date Taking? Authorizing Provider  ACCU-CHEK AVIVA PLUS test strip USE TO CHECK BLOOD SUGAR UP TO TWICE DAILY OR AS INSTRUCTED 09/04/17   Chipper Herb, MD  apixaban (ELIQUIS) 5 MG TABS tablet Take 1 tablet (5 mg total) by mouth 2 (two) times daily. 03/19/18   Minus Breeding, MD  B-D INS SYR ULTRAFINE 1CC/31G 31G X 5/16" 1 ML MISC USE TO INJECT INSULIN TWICE A DAY AS INSTRUCTED Patient taking differently: 1 Stick by Other route 3 (three) times daily before meals.  11/16/15   Chipper Herb, MD  Calcium Carbonate-Vitamin D (CALCIUM 600+D) 600-400 MG-UNIT per tablet Take 1 tablet by mouth 2 (two) times daily. Patient taking differently: Take 1 tablet by mouth 3 (three) times a week.  05/06/15   Cherre Robins, PharmD  carvedilol (COREG) 25 MG tablet Take 1 tablet (25 mg total) by mouth 2 (two) times daily. 06/21/18 06/16/19  Eileen Stanford, PA-C  clindamycin (CLEOCIN) 300 MG capsule Take 2 tablets (600 mg) one hour prior to dental visits. 05/24/18   Eileen Stanford, PA-C  furosemide (LASIX) 20 MG tablet Take 1 tablet (20 mg total) by mouth daily. 09/06/18   Almyra Deforest, PA  hydrALAZINE (APRESOLINE) 10  MG tablet Take 1 tablet (10 mg total) by mouth 3 (three) times daily. 09/06/18   Almyra Deforest, PA  Insulin Pen Needle 32G X 4 MM MISC Use to inject insulin with insulin pen 05/17/16   Chipper Herb, MD  losartan (COZAAR) 50 MG tablet Take 1 tablet (50 mg) in the morning, 0.5 tablet in the evening (25 mg) 09/06/18   Almyra Deforest, PA  meclizine (ANTIVERT) 12.5 MG tablet Take 12.5 mg by mouth 3 (three) times daily as needed for dizziness.    [provider]  mupirocin ointment (BACTROBAN) 2 % Apply 1 application topically 2 (two) times daily. 06/19/18   Chipper Herb, MD  NOVOLIN 70/30 RELION (70-30) 100 UNIT/ML injection Inject 15-65 Units into the skin See admin instructions. Taking 65 units with breakfast and 15 units at lunch and 65 units with dinner. 07/13/16   [provider]  ondansetron (ZOFRAN ODT) 4 MG disintegrating tablet Take 1 tablet (4 mg total) by mouth every 8 (eight) hours as needed for nausea. 01/03/17   Larene Pickett, PA-C  Pitavastatin Calcium (LIVALO) 4 MG TABS Take 4 mg by mouth daily.    [provider]  Prenatal Vit-Fe Fumarate-FA (PRENATAL MULTIVITAMIN) TABS tablet Take 1 tablet by mouth 3 (three) times a week.    [provider]  Propylene Glycol (SYSTANE COMPLETE) 0.6 % SOLN Apply 1 drop to eye daily as needed (dry eyes).     [provider]  sodium chloride (OCEAN) 0.65 % SOLN nasal spray Place 1 spray into both nostrils as needed for congestion. 11/09/14   Domenic Polite, MD  vitamin E 400 UNIT capsule Take 400 Units by mouth 3 (three) times a week.     [provider]  Olmesartan-Amlodipine-HCTZ (TRIBENZOR) 40-5-12.5 MG TABS Take 1 tablet by mouth daily.    04/12/18  [provider]    Past Medical History: Past Medical History:  Diagnosis Date  . Atrial fibrillation ([place])    a. maintaining sinus s/p DCCV, on Eliquis  . CAD (coronary artery disease)    a. Nonobstructive by cath 2006 - 25% LAD, 25-30% after 1st diag, distal 25% PDA, minor irreg LCx. b. Normal nuc 2011.  . CKD (chronic kidney disease), stage III ([place])   . Cystocele   . Difficult intubation 09-07-2012   big neck trouble with intubation parotid surgery"takes little med to sedate"  . Esophageal stricture   . GERD (gastroesophageal reflux disease)   . Hiatal hernia    a. 07/2013: HH with small stricture holding up barium tablet (concides with patient's sx of food sticking).  . History of kidney stones   . Hyperlipidemia   . Hypertension   . IBS (irritable bowel syndrome)   . Memory difficulty  11/26/2015  . Morbid obesity ([place])    a. hx of gastric bypass (sleeve gastrectomy 2015)  . Osteoporosis   . 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  Marland Kitchen Pericardial effusion    a. Mod by echo 2012 at time of PNA. b. Echo 07/2012: small pericardial effusion vs fat.  . S/P TAVR (transcatheter aortic valve replacement)   . Severe aortic stenosis   . Sleep apnea     "have mask; don't use it" (02/14/2018)  . TIA (transient ischemic attack) 10/2014; 06/2016   "some memory issues since; daughter says my speech is sometimes different" (02/14/2018)  . Type II diabetes mellitus ([facility])     Past Surgical History: Past Surgical  History:  Procedure Laterality Date  . BREATH TEK H PYLORI N/A 07/29/2013   Procedure: [place];  Surgeon: Shann Medal, MD;  Location: [facility];  Service: General;  Laterality: N/A;  . CARDIAC CATHETERIZATION  2006  . CARDIOVERSION N/A 02/16/2018   Procedure: CARDIOVERSION;  Surgeon: Pixie Casino, MD;  Location: [facility];  Service: Cardiovascular;  Laterality: N/A;  . CARPAL TUNNEL RELEASE Left 2011  . CATARACT EXTRACTION W/ INTRAOCULAR LENS  IMPLANT, BILATERAL Bilateral 2003   left  . CHOLECYSTECTOMY OPEN  1982  . COLONOSCOPY N/A 07/14/2015   Procedure: COLONOSCOPY;  Surgeon: Ladene Artist, MD;  Location: [facility];  Service: Endoscopy;  Laterality: N/A;  . ESOPHAGOGASTRODUODENOSCOPY N/A 12/27/2013   Procedure: ESOPHAGOGASTRODUODENOSCOPY (EGD);  Surgeon: Shann Medal, MD;  Location: [facility];  Service: General;  Laterality: N/A;  . ESOPHAGOGASTRODUODENOSCOPY (EGD) WITH ESOPHAGEAL DILATION    . INTRAOPERATIVE TRANSTHORACIC ECHOCARDIOGRAM  05/08/2018   Procedure: INTRAOPERATIVE TRANSTHORACIC ECHOCARDIOGRAM;  Surgeon: Burnell Blanks, MD;  Location: [facility];  Service: Open Heart Surgery;;  . LAPAROSCOPIC GASTRIC SLEEVE RESECTION N/A 01/20/2014   Procedure: LAPAROSCOPIC GASTRIC SLEEVE RESECTION;  Surgeon: Shann Medal, MD;   Location: [facility];  Service: General;  Laterality: N/A;  . PAROTIDECTOMY  09/07/2012   Procedure: PAROTIDECTOMY;  Surgeon: Melida Quitter, MD;  Location: [facility];  Service: ENT;  Laterality: Left;  LEFT PAROTIDECTOMY  . RIGHT/LEFT HEART CATH AND CORONARY ANGIOGRAPHY N/A 03/29/2018   Procedure: RIGHT/LEFT HEART CATH AND CORONARY ANGIOGRAPHY;  Surgeon: Burnell Blanks, MD;  Location: [facility];  Service: Cardiovascular;  Laterality: N/A;  . TEE WITHOUT CARDIOVERSION N/A 02/16/2018   Procedure: TRANSESOPHAGEAL ECHOCARDIOGRAM (TEE);  Surgeon: Pixie Casino, MD;  Location: [facility];  Service: Cardiovascular;  Laterality: N/A;  . TOE AMPUTATION Left 07/2017   great toe; [facility]   . TONSILLECTOMY  1968  . TRANSCATHETER AORTIC VALVE REPLACEMENT, TRANSFEMORAL  05/08/2018   Transcatheter Aortic Valve Replacement   . TRANSCATHETER AORTIC VALVE REPLACEMENT, TRANSFEMORAL N/A 05/08/2018   Procedure: TRANSCATHETER AORTIC VALVE REPLACEMENT, TRANSFEMORAL;  Surgeon: Burnell Blanks, MD;  Location: [facility];  Service: Open Heart Surgery;  Laterality: N/A;  . TUBAL LIGATION  yrs ago  . UPPER GI ENDOSCOPY  01/20/2014   Procedure: UPPER GI ENDOSCOPY;  Surgeon: Shann Medal, MD;  Location: [facility];  Service: General;;    Family History: Family History  Problem Relation Age of Onset  . Heart failure Mother   . Hypertension Mother   . Skin cancer Mother   . Colitis Mother   . Diabetes Mother   . Kidney Stones Mother   . Stroke Mother   . Heart disease Father   . Colon polyps Father   . Diabetes Father   . Heart failure Father   . Heart attack Sister   . Diabetes Other   . Stroke Maternal Grandfather   . Diabetes Maternal Grandfather   . Colon cancer Neg Hx     Social History: Social History   Socioeconomic History  . Marital status: Married    Spouse name: Not on file  . Number of children: 3  . Years of education: some college  . Highest education level: Some college, no  degree  Occupational History  . Occupation: retired    Fish farm manager: [employer]  . Financial resource strain: Not hard at all  . Food insecurity:    Worry: Never true    Inability: Never  true  . Transportation needs:    Medical: No    Non-medical: No  Tobacco Use  . Smoking status: Never Smoker  . Smokeless tobacco: Never Used  Substance and Sexual Activity  . Alcohol use: Yes    Alcohol/week: 0.0 standard drinks    Comment:  "mixed drink q few years"  . Drug use: Never  . Sexual activity: Yes    Birth control/protection: Post-menopausal  Lifestyle  . Physical activity:    Days per week: 0 days    Minutes per session: 0 min  . Stress: Only a little  Relationships  . Social connections:    Talks on phone: More than three times a week    Gets together: More than three times a week    Attends religious service: More than 4 times per year    Active member of club or organization: Yes    Attends meetings of clubs or organizations: More than 4 times per year    Relationship status: Married  Other Topics Concern  . Not on file  Social History Narrative   Married   Patient is right handed.   Patient rarely drinks caffeine.   Lives at home with husband in two story home. Husband washes laundry in the basement. They have 3 daughters and 7 grandchildren.     Allergies:  Allergies  Allergen Reactions  . Amlodipine Swelling    Ankle swelling  . Atorvastatin Other (See Comments)    Myalgias   . Crestor [Rosuvastatin Calcium] Other (See Comments)    Stiffness and back pain  . Ezetimibe-Simvastatin Other (See Comments)    Leg cramps  . Statins Other (See Comments)    Myalgias  . Celebrex [Celecoxib] Rash  . Levemir [Insulin Detemir] Rash  . Penicillins Other (See Comments)    SYNCOPE (Tolerated Ancef) Has patient had a PCN reaction causing immediate rash, facial/tongue/throat swelling, SOB or lightheadedness with hypotension: Yes Has patient had a PCN  reaction causing severe rash involving mucus membranes or skin necrosis: No Has patient had a PCN reaction that required hospitalization No Has patient had a PCN reaction occurring within the last 10 years: No If all of the above answers are "NO", then may proceed with Cephalosporin use.    Objective:    Vital Signs:   Pulse Rate:  [68] 68 (02/05 1348) Resp:  [19] 19 (02/05 1348) BP: (129)/(56) 129/56 (02/05 1348) SpO2:  [92 %] 92 % (02/05 1348) Last BM Date: 09/19/18  Weight change: There were no vitals filed for this visit.  Intake/Output:  No intake or output data in the 24 hours ending 09/19/18 1558    Physical Exam    General:  Elderly appearing.  HEENT: normal Neck: supple. JVP to jaw, but difficult to assess 2/2 body habitus. Carotids 2+ bilat; no bruits. No lymphadenopathy or thyromegaly appreciated. Cor: PMI nondisplaced. Regular, slightly brady. No rubs, gallops or murmurs. Lungs: Diminished basilar sounds.  Abdomen: Obese, slightly tender, + distended. No hepatosplenomegaly. No bruits or masses. Good bowel sounds. 1+ pitting edema into flanks Extremities: no cyanosis or clubbing. 2-3+ edema into thighs. Several areas of ? Skin breakdown vs cellulitis R>L.  Neuro: alert & orientedx3, cranial nerves grossly intact. moves all 4 extremities w/o difficulty. Affect pleasant  Telemetry   NSR 60s, personally reviewed.   EKG    pending  Labs   Basic Metabolic Panel: Recent Labs  Lab 09/13/18 1415 09/19/18 1440  NA 147* 146*  K 5.4* 4.4  CL  108* 110  CO2 24 26  GLUCOSE 180* 227*  BUN 41* 34*  CREATININE 2.02* 2.08*  CALCIUM 9.1 8.7*  MG  --  1.7    Liver Function Tests: Recent Labs  Lab 09/19/18 1440  AST 17  ALT 24  ALKPHOS 57  BILITOT 0.7  PROT 5.5*  ALBUMIN 2.8*   No results for input(s): LIPASE, AMYLASE in the last 168 hours. No results for input(s): AMMONIA in the last 168 hours.  CBC: Recent Labs  Lab 09/19/18 1440  WBC 7.2   NEUTROABS 4.6  HGB 11.4*  HCT 36.1  MCV 84.0  PLT 129*    Cardiac Enzymes: No results for input(s): CKTOTAL, CKMB, CKMBINDEX, TROPONINI in the last 168 hours.  BNP: BNP (last 3 results) Recent Labs    03/13/18 1712 05/04/18 0934 09/05/18 1048  BNP 163.3* 141.8* 116.6*    ProBNP (last 3 results) No results for input(s): PROBNP in the last 8760 hours.   CBG: Recent Labs  Lab 09/19/18 1404  GLUCAP 237*    Coagulation Studies: No results for input(s): LABPROT, INR in the last 72 hours.   Imaging    No results found.   Medications:     Current Medications: . apixaban  5 mg Oral BID  . Calcium Carbonate-Vitamin D  1 tablet Oral Once per day on Mon Wed Fri  . carvedilol  25 mg Oral BID  . furosemide  40 mg Intravenous BID  . furosemide  40 mg Intravenous Once  . hydrALAZINE  10 mg Oral TID  . insulin aspart protamine- aspart  30 Units Subcutaneous BID WC  . NON FORMULARY 4 mg  4 mg Oral QAC supper  . prenatal multivitamin  1 tablet Oral Once per day on Mon Wed Fri  . sodium chloride flush  3 mL Intravenous Q12H     Infusions: . sodium chloride         Patient Profile   [name] is a 68 y.o. female with AS s/p TAVR, chronic diastolic CHF, PAF, CKD III, HLD, HTN, OSA, and morbid obesity  Admitted from [facility] office with marked volume overload.   Assessment/Plan   1. Acute on chronic diastolic CHF - Echo 03/15/4480 LVEF 65-70%, Grade 2 DD, Stable bioprosthetic AVR, Trivial MS, small to mod pericardial effusion.  - Volume status markedly elevated - Continue lasix 80 mg IV BID and add metolazone 2.5 mg once this am. Supp K. Can consider lasix gtt if needed.  - ABD US showed without ascites. Cystic mass found incidentally as below.  - Consider PYP scan with AS and pericardial effusion.  - Consider PICC line. Consider RHC once diuresed.   2. Severe AS s/p TAVR - Stable on Echo 06/2018 - Repeat echo pending  3. HTN - Continue coreg 25 mg  BID - Continue hydralazine 10 mg TID  4. Paroxysmal Afib - Continue eliquis - NSR now.   5. CKD IV - Cr 2.0-2.2  6. CAD - No s/s of ischemia.    - Diffuse disease on cath 03/2018, no targets for intervention - Continue ASA and statin  7. LLQ cyst - ? Ovarian cyst. Consider non-emergent CT of the pelvis once diuresed.   8. Cellulitis - RLE with erythema and ? Warmth. Will cover with doxycycline and follow.  - No WBC or fever.   9. OSA - Non-compliant with CPAP. Will need to try and get her to use.   Medication concerns reviewed with patient and pharmacy team.  Barriers identified: None at this time.   Length of Stay: 0  Annamaria Helling  09/19/2018, 3:58 PM  Advanced Heart Failure Team Pager [phone] (M-F; 7a - 4p)  Please contact [facility] for night-coverage after hours (4p -7a ) and weekends on [email or web address]  Patient seen with PA, agree with the above note.   Patient has a history of diastolic CHF and s/p TAVR in 11/19.  Over the last few weeks, she has become increasingly dyspneic with leg and abdominal swelling.  She has history of PAF but is in NSR.  Abdominal US was done today, no ascites (has LLQ cyst which will need further evaluation).    On exam, she is volume overloaded with JVP 14 cm, 2+ edema to knees, moderate abdominal distention, right lower leg redness.   1. Acute on chronic diastolic CHF: Last echo in 11/19 with EF 65-70%, moderate LVH, bioprosthetic aortic valve with mean gradient 13 (s/p TAVR), trivial mitral stenosis, small-moderate pericardial effusion. On exam, she is markedly volume overloaded.  She has been on a low dose of Lasix at home due to CKD stage 3.  - Lasix 80 mg IV bid with dose of metolazone today. Good diuresis initially overnight.  - Repeat echo to reassess the TAVR valve and pericardial effusion.  - Cardiac amyloidosis (most likely transthyretin) is a consideration.  Will order PYP scan and myeloma panel.  - Will likely  need RHC, will see how well she diureses.  2. CKD stage 3: Follow creatinine closely with diuresis.  Creatinine 1.92 at baseline.  3. CAD: Cath in 8/19 with moderate RCA and LCx disease, severe distal LAD disease.  No chest pain.  - Continue pitivastatin.  4. Atrial fibrillation: Paroxysmal, in NSR today.   - Continue Eliquis.  5. HTN: BP elevated.  - Continue Coreg.  - Increase hydralazine to 25 mg tid.  6. Possible RLE cellulitis: Cover with doxycycline for now.  7. LLQ cyst on abdominal US: Needs eventual pelvic CT to evaluate.   [name] 09/20/2018 11:42 AM

## 2018-09-19 NOTE — H&P (Signed)
HPI The patient has follow up of AS.   She is status post TAVR.   Since I last saw her she has had continued problems with fluid retention.  Management is compounded by renal insufficiency.  She has had intermittent increased diuretic.  Her BNP recently was very mildly elevated and lower than previous.  Her Creat is 2.02 which is not quite at baseline but lower than 2 weeks prior.    Despite having frequent office visits and adjustments to her medication she continues to have weight gain.  Is about 40 pounds since November.  She is gained 10 pounds in 1 week per the home health nurse.  She has increased abdominal girth.  She has increased lower extremity swelling.  She is describing PND and orthopnea.  She is having dyspnea with mild exertion.  She is not noticing any new palpitations, presyncope or syncope.  She is not having any chest pressure, neck or arm discomfort.  She is not watching her salt in particular.  She eats [place] fries and onion rings.  She was advised about this and she apparently does reduce her fluid intake but I am not entirely clear on this.        Allergies  Allergen Reactions  . Amlodipine   . Atorvastatin Other (See Comments)    Myalgias   . Crestor [Rosuvastatin Calcium] Other (See Comments)    Stiffness and back pain  . Ezetimibe-Simvastatin Other (See Comments)    Leg cramps  . Statins Other (See Comments)    Myalgias  . Celebrex [Celecoxib] Rash  . Levemir [Insulin Detemir] Rash  . Penicillins Other (See Comments)    SYNCOPE (Tolerated Ancef) Has patient had a PCN reaction causing immediate rash, facial/tongue/throat swelling, SOB or lightheadedness with hypotension: Yes Has patient had a PCN reaction causing severe rash involving mucus membranes or skin necrosis: No Has patient had a PCN reaction that required hospitalization No Has patient had a PCN reaction occurring within the last 10 years: No If all of the above answers are "NO",  then may proceed with Cephalosporin use.    No current outpatient medications on file.   No current facility-administered medications for this visit.         Past Medical History:  Diagnosis Date  . Atrial fibrillation ([place])    a. maintaining sinus s/p DCCV, on Eliquis  . CAD (coronary artery disease)    a. Nonobstructive by cath 2006 - 25% LAD, 25-30% after 1st diag, distal 25% PDA, minor irreg LCx. b. Normal nuc 2011.  . CKD (chronic kidney disease), stage III ([place])   . Cystocele   . Difficult intubation 09-07-2012   big neck trouble with intubation parotid surgery"takes little med to sedate"  . Esophageal stricture   . GERD (gastroesophageal reflux disease)   . Hiatal hernia    a. 07/2013: HH with small stricture holding up barium tablet (concides with patient's sx of food sticking).  . History of kidney stones   . Hyperlipidemia   . Hypertension   . IBS (irritable bowel syndrome)   . Memory difficulty 11/26/2015  . Morbid obesity ([place])    a. hx of gastric bypass (sleeve gastrectomy 2015)  . Osteoporosis   . 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  Marland Kitchen Pericardial effusion    a. Mod by echo 2012 at time of PNA. b. Echo 07/2012: small pericardial effusion vs fat.  . S/P TAVR (transcatheter aortic valve replacement)   .  Severe aortic stenosis   . Sleep apnea     "have mask; don't use it" (02/14/2018)  . TIA (transient ischemic attack) 10/2014; 06/2016   "some memory issues since; daughter says my speech is sometimes different" (02/14/2018)  . Type II diabetes mellitus ([place])          Past Surgical History:  Procedure Laterality Date  . BREATH TEK H PYLORI N/A 07/29/2013   Procedure: [place];  Surgeon: Shann Medal, MD;  Location: [facility];  Service: General;  Laterality: N/A;  . CARDIAC CATHETERIZATION  2006  . CARDIOVERSION N/A 02/16/2018   Procedure: CARDIOVERSION;  Surgeon: Pixie Casino, MD;   Location: [facility];  Service: Cardiovascular;  Laterality: N/A;  . CARPAL TUNNEL RELEASE Left 2011  . CATARACT EXTRACTION W/ INTRAOCULAR LENS  IMPLANT, BILATERAL Bilateral 2003   left  . CHOLECYSTECTOMY OPEN  1982  . COLONOSCOPY N/A 07/14/2015   Procedure: COLONOSCOPY;  Surgeon: Ladene Artist, MD;  Location: [facility];  Service: Endoscopy;  Laterality: N/A;  . ESOPHAGOGASTRODUODENOSCOPY N/A 12/27/2013   Procedure: ESOPHAGOGASTRODUODENOSCOPY (EGD);  Surgeon: Shann Medal, MD;  Location: [facility];  Service: General;  Laterality: N/A;  . ESOPHAGOGASTRODUODENOSCOPY (EGD) WITH ESOPHAGEAL DILATION    . INTRAOPERATIVE TRANSTHORACIC ECHOCARDIOGRAM  05/08/2018   Procedure: INTRAOPERATIVE TRANSTHORACIC ECHOCARDIOGRAM;  Surgeon: Burnell Blanks, MD;  Location: [facility];  Service: Open Heart Surgery;;  . LAPAROSCOPIC GASTRIC SLEEVE RESECTION N/A 01/20/2014   Procedure: LAPAROSCOPIC GASTRIC SLEEVE RESECTION;  Surgeon: Shann Medal, MD;  Location: [facility];  Service: General;  Laterality: N/A;  . PAROTIDECTOMY  09/07/2012   Procedure: PAROTIDECTOMY;  Surgeon: Melida Quitter, MD;  Location: [place];  Service: ENT;  Laterality: Left;  LEFT PAROTIDECTOMY  . RIGHT/LEFT HEART CATH AND CORONARY ANGIOGRAPHY N/A 03/29/2018   Procedure: RIGHT/LEFT HEART CATH AND CORONARY ANGIOGRAPHY;  Surgeon: Burnell Blanks, MD;  Location: [facility];  Service: Cardiovascular;  Laterality: N/A;  . TEE WITHOUT CARDIOVERSION N/A 02/16/2018   Procedure: TRANSESOPHAGEAL ECHOCARDIOGRAM (TEE);  Surgeon: Pixie Casino, MD;  Location[facility];  Service: Cardiovascular;  Laterality: N/A;  . TOE AMPUTATION Left 07/2017   great toe; [facility]   . TONSILLECTOMY  1968  . TRANSCATHETER AORTIC VALVE REPLACEMENT, TRANSFEMORAL  05/08/2018   Transcatheter Aortic Valve Replacement   . TRANSCATHETER AORTIC VALVE REPLACEMENT, TRANSFEMORAL N/A 05/08/2018   Procedure: TRANSCATHETER AORTIC VALVE  REPLACEMENT, TRANSFEMORAL;  Surgeon: Burnell Blanks, MD;  Location: [place];  Service: Open Heart Surgery;  Laterality: N/A;  . TUBAL LIGATION  yrs ago  . UPPER GI ENDOSCOPY  01/20/2014   Procedure: UPPER GI ENDOSCOPY;  Surgeon: Shann Medal, MD;  Location: [facility];  Service: General;;        Family History  Problem Relation Age of Onset  . Heart failure Mother   . Hypertension Mother   . Skin cancer Mother   . Colitis Mother   . Diabetes Mother   . Kidney Stones Mother   . Stroke Mother   . Heart disease Father   . Colon polyps Father   . Diabetes Father   . Heart failure Father   . Heart attack Sister   . Diabetes Other   . Stroke Maternal Grandfather   . Diabetes Maternal Grandfather   . Colon cancer Neg Hx    Social History        Socioeconomic History  . Marital status: Married    Spouse name: Not on file  .  Number of children: 3  . Years of education: some college  . Highest education level: Some college, no degree  Occupational History  . Occupation: retired    Fish farm manager: [employer]  . Financial resource strain: Not hard at all  . Food insecurity:    Worry: Never true    Inability: Never true  . Transportation needs:    Medical: No    Non-medical: No  Tobacco Use  . Smoking status: Never Smoker  . Smokeless tobacco: Never Used  Substance and Sexual Activity  . Alcohol use: Yes    Alcohol/week: 0.0 standard drinks    Comment:  "mixed drink q few years"  . Drug use: Never  . Sexual activity: Yes    Birth control/protection: Post-menopausal  Lifestyle  . Physical activity:    Days per week: 0 days    Minutes per session: 0 min  . Stress: Only a little  Relationships  . Social connections:    Talks on phone: More than three times a week    Gets together: More than three times a week    Attends religious service: More than 4 times per year    Active member of club or  organization: Yes    Attends meetings of clubs or organizations: More than 4 times per year    Relationship status: Married  Other Topics Concern  . Not on file  Social History Narrative   Married   Patient is right handed.   Patient rarely drinks caffeine.   Lives at home with husband in two story home. Husband washes laundry in the basement. They have 3 daughters and 7 grandchildren.     ROS:  As stated in the HPI and negative for all other systems.   PHYSICAL EXAM BP 120/60   Pulse (!) 56   Ht 5\' 7"  (1.702 m)   Wt 248 lb 4 oz (112.6 kg)   BMI 38.88 kg/m   GENERAL:  Well appearing HEENT:  Pupils equal round and reactive, fundi not visualized, oral mucosa unremarkable NECK: Unable to assess jugular venous distention secondary to the size of her neck, waveform within normal limits, carotid upstroke brisk and symmetric, no bruits, no thyromegaly LYMPHATICS:  No cervical, inguinal adenopathy LUNGS:  Clear to auscultation bilaterally BACK:  No CVA tenderness CHEST:  Unremarkable HEART:  PMI not displaced or sustained,S1 and S2 within normal limits, no S3, no S4, no clicks, no rubs, 2 out of 6 apical systolic murmur nonradiating, no diastolic murmurs ABD: Obese and distended, positive bowel sounds normal in frequency in pitch, no bruits, no rebound, no guarding, no midline pulsatile mass, no hepatomegaly, no splenomegaly EXT:  2 plus pulses throughout, moderate lateral lower extremity edema, no cyanosis no clubbing SKIN:  No rashes no nodules questional erythema in the right anterior tibial NEURO:  Cranial nerves II through XII grossly intact, motor grossly intact throughout [facility]:  Cognitively intact, oriented to person place and time   EKG:   06/15/2018 sinus rhythm with a short PR interval, rate 84, axis within normal limits, intervals within normal limits, poor anterior R wave progression, diffuse inferolateral ST depression unchanged from previous.  ASSESSMENT  AND PLAN   Severe AS s/p TAVR:    This was assessed with echo in November and seems to be functioning properly.  However, she will be reassessed as below.  Acute on chronic diastolic CHF:    She has worsening acute on chronic diastolic heart failure.  This is  despite frequent med adjustments.  Compounding this is her renal insufficiency.  At this point she needs to be admitted to the hospital for further work-up.  I did suggest CT of her abdomen or ultrasound to look for ascites.  She might need paracentesis.  I would repeat an echocardiogram to look for increasing effusion.  A day also concerned about diastolic dysfunction with possible restrictive physiology.  She might need right heart catheterization.  I will make arrangements today to admit her to the hospital for this work-up and further IV diuresis as tolerated.  HTN:   Pressures controlled.  Meds will be adjusted as dictated by the work-up above.  Atrial fibrillation:  She is maintaining sinus rhythm.  I am going to stop her amiodarone.  She continues with anticoagulation.  Chronic kidney disease, stage 3:  Creatinine is 2.02 most recently which is slightly above her baseline but better than the labs 2 weeks ago.  This will be followed closely.  Moderate pericardial effusion: She will need a repeat echo as above.    Signed:  Marijo File, MD

## 2018-09-20 ENCOUNTER — Inpatient Hospital Stay (HOSPITAL_COMMUNITY): Payer: Medicare Other

## 2018-09-20 ENCOUNTER — Encounter (HOSPITAL_COMMUNITY): Payer: Self-pay | Admitting: General Practice

## 2018-09-20 ENCOUNTER — Other Ambulatory Visit: Payer: Self-pay

## 2018-09-20 DIAGNOSIS — I5033 Acute on chronic diastolic (congestive) heart failure: Secondary | ICD-10-CM

## 2018-09-20 LAB — BASIC METABOLIC PANEL
Anion gap: 12 (ref 5–15)
BUN: 31 mg/dL — ABNORMAL HIGH (ref 8–23)
CO2: 26 mmol/L (ref 22–32)
Calcium: 8.7 mg/dL — ABNORMAL LOW (ref 8.9–10.3)
Chloride: 107 mmol/L (ref 98–111)
Creatinine, Ser: 1.92 mg/dL — ABNORMAL HIGH (ref 0.44–1.00)
GFR calc Af Amer: 31 mL/min — ABNORMAL LOW (ref >60)
GFR calc non Af Amer: 26 mL/min — ABNORMAL LOW (ref >60)
Glucose, Bld: 146 mg/dL — ABNORMAL HIGH (ref 70–99)
Potassium: 3.8 mmol/L (ref 3.5–5.1)
Sodium: 145 mmol/L (ref 135–145)

## 2018-09-20 LAB — GLUCOSE, CAPILLARY
Glucose-Capillary: 134 mg/dL — ABNORMAL HIGH (ref 70–99)
Glucose-Capillary: 142 mg/dL — ABNORMAL HIGH (ref 70–99)
Glucose-Capillary: 151 mg/dL — ABNORMAL HIGH (ref 70–99)
Glucose-Capillary: 235 mg/dL — ABNORMAL HIGH (ref 70–99)

## 2018-09-20 LAB — ECHOCARDIOGRAM COMPLETE
Height: 67 in
Weight: 3872 oz

## 2018-09-20 MED ORDER — METOLAZONE 2.5 MG PO TABS
2.5000 mg | ORAL_TABLET | Freq: Once | ORAL | Status: AC
  Administered 2018-09-20: 2.5 mg via ORAL
  Filled 2018-09-20: qty 1

## 2018-09-20 MED ORDER — NONFORMULARY OR COMPOUNDED ITEM
4.0000 mg | Freq: Every day | Status: DC
  Administered 2018-09-20 – 2018-09-27 (×7): 4 mg via ORAL
  Filled 2018-09-20 (×8): qty 1

## 2018-09-20 MED ORDER — HYDRALAZINE HCL 25 MG PO TABS
25.0000 mg | ORAL_TABLET | Freq: Three times a day (TID) | ORAL | Status: DC
  Administered 2018-09-20 – 2018-09-25 (×17): 25 mg via ORAL
  Filled 2018-09-20 (×19): qty 1

## 2018-09-20 NOTE — Progress Notes (Signed)
Inpatient Diabetes Program Recommendations  AACE/ADA: New Consensus Statement on Inpatient Glycemic Control (2015)  Target Ranges:  Prepandial:   less than 140 mg/dL      Peak postprandial:   less than 180 mg/dL (1-2 hours)      Critically ill patients:  140 - 180 mg/dL   Lab Results  Component Value Date   GLUCAP 151 (H) 09/20/2018   HGBA1C 10.3 (H) 09/19/2018    Review of Glycemic Control  Diabetes history: DM2 Outpatient Diabetes medications: Novolin 70/30 65-20-65 units tidwc Current orders for Inpatient glycemic control:  Novolog 70/30 30 units bid, Novolog 15 units Q ac Lunch, Novolog 0-15 units tidwc and hs  Spoke with pt regarding her HgbA1C of 10.3%. Pt states she has Libre and checks blood sugars daily. Pt states daughter is Diabetes Tourist information centre manager at [employer] and she understands importance of controlling blood sugars. Pt said she has hypoglycemia in am and has been reducing dinner insulin dose to 55 units.  Blood sugars stable.   Inpatient Diabetes Program Recommendations:     Agree with orders.  Will follow.  Thank you. Lorenda Peck, RD, LDN, CDE Inpatient Diabetes Coordinator [phone]

## 2018-09-20 NOTE — Progress Notes (Signed)
  Echocardiogram 2D Echocardiogram has been performed.  Jannett Celestine 09/20/2018, 1:59 PM

## 2018-09-20 NOTE — Discharge Instructions (Signed)

## 2018-09-20 NOTE — Progress Notes (Signed)
Patient was unable to lay dawn for the nuclear test. They recommend patient to be diurese few more day before they perform the test. MD attending notified said he is not taking care of the patient. Will continue to monitor patient.

## 2018-09-21 DIAGNOSIS — N183 Chronic kidney disease, stage 3 (moderate): Secondary | ICD-10-CM

## 2018-09-21 LAB — BASIC METABOLIC PANEL
Anion gap: 13 (ref 5–15)
BUN: 36 mg/dL — ABNORMAL HIGH (ref 8–23)
CO2: 27 mmol/L (ref 22–32)
Calcium: 8.9 mg/dL (ref 8.9–10.3)
Chloride: 104 mmol/L (ref 98–111)
Creatinine, Ser: 2.39 mg/dL — ABNORMAL HIGH (ref 0.44–1.00)
GFR calc Af Amer: 24 mL/min — ABNORMAL LOW (ref >60)
GFR calc non Af Amer: 20 mL/min — ABNORMAL LOW (ref >60)
Glucose, Bld: 165 mg/dL — ABNORMAL HIGH (ref 70–99)
Potassium: 4.1 mmol/L (ref 3.5–5.1)
Sodium: 144 mmol/L (ref 135–145)

## 2018-09-21 LAB — GLUCOSE, CAPILLARY
Glucose-Capillary: 160 mg/dL — ABNORMAL HIGH (ref 70–99)
Glucose-Capillary: 183 mg/dL — ABNORMAL HIGH (ref 70–99)
Glucose-Capillary: 249 mg/dL — ABNORMAL HIGH (ref 70–99)
Glucose-Capillary: 260 mg/dL — ABNORMAL HIGH (ref 70–99)

## 2018-09-21 MED ORDER — METOLAZONE 2.5 MG PO TABS: 2.5000 mg | ORAL_TABLET | Freq: Once | ORAL | Status: DC

## 2018-09-21 MED ORDER — DOXYCYCLINE HYCLATE 100 MG PO TABS
100.0000 mg | ORAL_TABLET | Freq: Two times a day (BID) | ORAL | Status: AC
  Administered 2018-09-21 – 2018-09-27 (×14): 100 mg via ORAL
  Filled 2018-09-21 (×14): qty 1

## 2018-09-21 MED ORDER — FUROSEMIDE 10 MG/ML IJ SOLN
12.0000 mg/h | INTRAVENOUS | Status: DC
  Administered 2018-09-21 – 2018-09-23 (×3): 12 mg/h via INTRAVENOUS
  Filled 2018-09-21: qty 20
  Filled 2018-09-21: qty 25
  Filled 2018-09-21: qty 20

## 2018-09-21 NOTE — Progress Notes (Addendum)
Advanced Heart Failure Rounding Note  PCP-Cardiologist: Minus Breeding, MD   Subjective:    Feeling about the same this am. Remains SOB with minimal exertion, I.e. getting up to the bedside commode and wiping. Remains swollen into her thighs. R>L leg tenderness.   Cr 1.9 -> 2.39. Negative 1.7 lbs and down 6 lbs on IV lasix BID and metolazone 2.5 x one.   PYP scan attempted 09/20/2018, remained too orthopneic.   Objective:   Weight Range: 107.4 kg Body mass index is 37.07 kg/m.   Vital Signs:   Temp:  [97.7 F (36.5 C)-98.2 F (36.8 C)] 98.1 F (36.7 C) (02/07 0512) Pulse Rate:  [48-81] 77 (02/07 0512) Resp:  [18] 18 (02/07 0512) BP: (106-160)/(46-64) 160/61 (02/07 0512) SpO2:  [91 %-94 %] 91 % (02/07 0512) Weight:  [107.4 kg] 107.4 kg (02/07 0512) Last BM Date: 09/20/18  Weight change: Filed Weights   09/19/18 2003 09/20/18 0618 09/21/18 0512  Weight: 110.9 kg 109.8 kg 107.4 kg   Intake/Output:   Intake/Output Summary (Last 24 hours) at 09/21/2018 0813 Last data filed at 09/21/2018 0515 Gross per 24 hour  Intake 720 ml  Output 2450 ml  Net -1730 ml    Physical Exam    General:  NAD HEENT: Normal Neck: Supple. JVP to jaw. Carotids 2+ bilat; no bruits. No lymphadenopathy or thyromegaly appreciated. Cor: PMI nondisplaced. Regular rate & rhythm. No rubs, gallops or murmurs. Lungs: Clear Abdomen: Soft, nontender, nondistended. No hepatosplenomegaly. No bruits or masses. Good bowel sounds. Extremities: No cyanosis or clubbing. Remains swollen up into her thighs with skin breakdown vs cellulitic changes on RLE.  Neuro: Alert & orientedx3, cranial nerves grossly intact. moves all 4 extremities w/o difficulty. Affect pleasant  Telemetry   NSR 70-80s, personally reviewed.   EKG    No new tracings.    Labs    CBC Recent Labs    09/19/18 1440  WBC 7.2  NEUTROABS 4.6  HGB 11.4*  HCT 36.1  MCV 84.0  PLT 734*   Basic Metabolic Panel Recent Labs   09/19/18 1440 09/20/18 0550 09/21/18 0500  NA 146* 145 144  K 4.4 3.8 4.1  CL 110 107 104  CO2 26 26 27   GLUCOSE 227* 146* 165*  BUN 34* 31* 36*  CREATININE 2.08* 1.92* 2.39*  CALCIUM 8.7* 8.7* 8.9  MG 1.7  --   --    Liver Function Tests Recent Labs    09/19/18 1440  AST 17  ALT 24  ALKPHOS 57  BILITOT 0.7  PROT 5.5*  ALBUMIN 2.8*   No results for input(s): LIPASE, AMYLASE in the last 72 hours. Cardiac Enzymes No results for input(s): CKTOTAL, CKMB, CKMBINDEX, TROPONINI in the last 72 hours.  BNP: BNP (last 3 results) Recent Labs    03/13/18 1712 05/04/18 0934 09/05/18 1048  BNP 163.3* 141.8* 116.6*   ProBNP (last 3 results) No results for input(s): PROBNP in the last 8760 hours.  D-Dimer No results for input(s): DDIMER in the last 72 hours. Hemoglobin A1C Recent Labs    09/19/18 1440  HGBA1C 10.3*   Fasting Lipid Panel No results for input(s): CHOL, HDL, LDLCALC, TRIG, CHOLHDL, LDLDIRECT in the last 72 hours. Thyroid Function Tests No results for input(s): TSH, T4TOTAL, T3FREE, THYROIDAB in the last 72 hours.  Invalid input(s): FREET3  Other results:  Imaging     No results found.   Medications:     Scheduled Medications: . apixaban  5 mg Oral  BID  . calcium-vitamin D  1 tablet Oral Q M,W,F  . carvedilol  25 mg Oral BID  . furosemide  40 mg Intravenous BID  . hydrALAZINE  25 mg Oral TID  . insulin aspart  0-15 Units Subcutaneous TID WC  . insulin aspart  0-5 Units Subcutaneous QHS  . insulin aspart  15 Units Subcutaneous QAC lunch  . insulin aspart protamine- aspart  30 Units Subcutaneous BID WC  . Livalo 4 mg tablet  4 mg Oral QAC supper  . prenatal multivitamin  1 tablet Oral Once per day on Mon Wed Fri  . sodium chloride flush  3 mL Intravenous Q12H     Infusions: . sodium chloride       PRN Medications:  sodium chloride, acetaminophen, ALPRAZolam, meclizine, nitroGLYCERIN, ondansetron (ZOFRAN) IV, polyvinyl alcohol,  sodium chloride, sodium chloride flush, zolpidem  Patient Profile   [name] is a 68 y.o. female with AS s/p TAVR, chronic diastolic CHF, PAF, CKD III, HLD, HTN, OSA, and morbid obesity  Admitted from [facility] office with marked volume overload.   Assessment/Plan   1. Acute on chronic diastolic CHF - Echo 32/09/252 LVEF 65-70%, Grade 2 DD, Stable bioprosthetic AVR, Trivial MS, small to mod pericardial effusion.  - Volume status remains markedly elevated.  - With Cr bump, will change lasix to lasix gtt at 10 mg/hr and follow response.  - Hold metolazone this am with AKI.  - ABD US showed without ascites. Cystic mass found incidentally as below.  - Consider PYP scan with AS and pericardial effusion once she is more diuresed. Too orthopneic currently.  - Consider PICC line as needed. Consider RHC once diuresed.   2. Severe AS s/p TAVR - Stable on Echo 06/2018 - Repeat echo showed LVEF  3. HTN - Continue coreg 25 mg BID - Continue hydralazine 10 mg TID  4. Paroxysmal Afib - Continue eliquis - Remains in NSR.   5. AKI on CKD IV Baseline,  Cr 2.0-2.2 - Cr up to 2.39 with diuresis. Follow.   6. CAD - No s/s of ischemia.    - Diffuse disease on cath 03/2018, no targets for intervention - Continue ASA and statin  7. LLQ cyst - ? Ovarian cyst.  - Will need non-emergent CT of the pelvis once diuresed.   8. Cellulitis - No WBC or fever.  - Have ordered Doxy 100 mg BID x 10 days for cellulitis.   9. OSA - Off CPAP for years. Will likely need new sleep study.   Medication concerns reviewed with patient and pharmacy team. Barriers identified: None at this time.   Length of Stay: 2  [name]  09/21/2018, 8:13 AM  Advanced Heart Failure Team Pager [phone] (M-F; 7a - 4p)  Please contact [facility] for night-coverage after hours (4p -7a ) and weekends on [email or web address]  Patient seen with PA, agree with the above note.   Weight down with  diuresis yesterday though I/Os not impressively negative. Creatinine up some to 2.39.     On exam, she remains volume overloaded with JVP 12-14 cm, 2+ edema to knees, moderate abdominal distention, right lower leg redness.   1. Acute on chronic diastolic CHF: Echo this admission with EF 60-65%, moderate LVH, grade 2 diastolic dysfunction, trivial mitral stenosis, mildly dilated RV with mildly decreased systolic function, TAVR valve looks ok, moderate pericardial effusion (no tamponade). On exam, she remains volume overloaded.  She needs additional diuresis but creatinine up to  2.39. - Will change Lasix to 12 mg/hr gtt, hold metolazone today.  Follow creatinine closely.  - Cardiac amyloidosis (most likely transthyretin) is a consideration.  Unable to get PYP scan due to orthopnea, will try when she is better diuresed. - Will arrange for RHC next week.  2. CKD stage 3: Follow creatinine closely with diuresis.  Creatinine up mildly to 2.39, follow closely with diuresis.  3. CAD: Cath in 8/19 with moderate RCA and LCx disease, severe distal LAD disease.  No chest pain.  - Continue pitivastatin.  4. Atrial fibrillation: Paroxysmal, in NSR today.   - Continue Eliquis (hold prior to cath).  5. HTN: BP elevated.  - Continue Coreg.  - Continue hydralazine 25 mg tid.  6. Possible RLE cellulitis: Cover with doxycycline for now.  7. LLQ cyst on abdominal US: Needs eventual pelvic CT to evaluate.   [name] 09/21/2018 9:34 AM

## 2018-09-21 NOTE — Care Management Important Message (Signed)
Important Message  Patient Details  Name: [name] MRN: [id number] Date of Birth: 02-16-1951   Medicare Important Message Given:  Yes    Kreston Ahrendt P [place] 09/21/2018, 1:33 PM

## 2018-09-22 LAB — BASIC METABOLIC PANEL
Anion gap: 11 (ref 5–15)
BUN: 39 mg/dL — ABNORMAL HIGH (ref 8–23)
CO2: 30 mmol/L (ref 22–32)
Calcium: 8.6 mg/dL — ABNORMAL LOW (ref 8.9–10.3)
Chloride: 101 mmol/L (ref 98–111)
Creatinine, Ser: 2.43 mg/dL — ABNORMAL HIGH (ref 0.44–1.00)
GFR calc Af Amer: 23 mL/min — ABNORMAL LOW (ref >60)
GFR calc non Af Amer: 20 mL/min — ABNORMAL LOW (ref >60)
Glucose, Bld: 274 mg/dL — ABNORMAL HIGH (ref 70–99)
Potassium: 3.8 mmol/L (ref 3.5–5.1)
Sodium: 142 mmol/L (ref 135–145)

## 2018-09-22 LAB — GLUCOSE, CAPILLARY
Glucose-Capillary: 254 mg/dL — ABNORMAL HIGH (ref 70–99)
Glucose-Capillary: 363 mg/dL — ABNORMAL HIGH (ref 70–99)
Glucose-Capillary: 300 mg/dL — ABNORMAL HIGH (ref 70–99)
Glucose-Capillary: 328 mg/dL — ABNORMAL HIGH (ref 70–99)

## 2018-09-22 LAB — MAGNESIUM: Magnesium: 1.6 mg/dL — ABNORMAL LOW (ref 1.7–2.4)

## 2018-09-22 MED ORDER — MAGNESIUM SULFATE 2 GM/50ML IV SOLN
2.0000 g | Freq: Once | INTRAVENOUS | Status: AC
  Administered 2018-09-22: 2 g via INTRAVENOUS
  Filled 2018-09-22: qty 50

## 2018-09-22 MED ORDER — POTASSIUM CHLORIDE CRYS ER 20 MEQ PO TBCR
40.0000 meq | EXTENDED_RELEASE_TABLET | Freq: Once | ORAL | Status: AC
  Administered 2018-09-22: 40 meq via ORAL
  Filled 2018-09-22: qty 2

## 2018-09-22 NOTE — Progress Notes (Signed)
Patient ID: REMINGTON SKALSKY, female   DOB: 03/27/1951, 68 y.o.   MRN: [id number]     Advanced Heart Failure Rounding Note  PCP-Cardiologist: Minus Breeding, MD   Subjective:    Good diuresis yesterday, weight down 9 lbs.  Breathing better.   Cr 1.9 -> 2.39 -> 2.43.   PYP scan attempted 09/20/2018, remained too orthopneic.   Objective:   Weight Range: 103.4 kg Body mass index is 35.69 kg/m.   Vital Signs:   Temp:  [98 F (36.7 C)-98.9 F (37.2 C)] 98.8 F (37.1 C) (02/08 0742) Pulse Rate:  [67-80] 74 (02/08 0907) Resp:  [18-20] 20 (02/08 0742) BP: (117-147)/(39-52) 147/48 (02/08 0907) SpO2:  [89 %-93 %] 90 % (02/08 0742) Weight:  [103.4 kg] 103.4 kg (02/08 0349) Last BM Date: 09/21/18  Weight change: Filed Weights   09/20/18 0618 09/21/18 0512 09/22/18 0349  Weight: 109.8 kg 107.4 kg 103.4 kg   Intake/Output:   Intake/Output Summary (Last 24 hours) at 09/22/2018 0951 Last data filed at 09/22/2018 0649 Gross per 24 hour  Intake 1306.59 ml  Output 4850 ml  Net -3543.41 ml    Physical Exam    General: NAD Neck: Thick, JVP 12-14 cm, no thyromegaly or thyroid nodule.  Lungs: Clear to auscultation bilaterally with normal respiratory effort. CV: Nonpalpable PMI.  Heart regular S1/S2, no S3/S4, 1/6 SEM RUSB.  1+ edema to knees bilaterally.   Abdomen: Soft, nontender, no hepatosplenomegaly, no distention.  Skin: Redness right lower leg, no change.  Neurologic: Alert and oriented x 3.  Psych: Normal affect. Extremities: No clubbing or cyanosis.  HEENT: Normal.    Telemetry   NSR 70-80s, personally reviewed.   EKG    No new tracings.    Labs    CBC Recent Labs    09/19/18 1440  WBC 7.2  NEUTROABS 4.6  HGB 11.4*  HCT 36.1  MCV 84.0  PLT 259*   Basic Metabolic Panel Recent Labs    09/19/18 1440  09/21/18 0500 09/22/18 0602  NA 146*   < > 144 142  K 4.4   < > 4.1 3.8  CL 110   < > 104 101  CO2 26   < > 27 30  GLUCOSE 227*   < > 165* 274*  BUN 34*    < > 36* 39*  CREATININE 2.08*   < > 2.39* 2.43*  CALCIUM 8.7*   < > 8.9 8.6*  MG 1.7  --   --  1.6*   < > = values in this interval not displayed.   Liver Function Tests Recent Labs    09/19/18 1440  AST 17  ALT 24  ALKPHOS 57  BILITOT 0.7  PROT 5.5*  ALBUMIN 2.8*   No results for input(s): LIPASE, AMYLASE in the last 72 hours. Cardiac Enzymes No results for input(s): CKTOTAL, CKMB, CKMBINDEX, TROPONINI in the last 72 hours.  BNP: BNP (last 3 results) Recent Labs    03/13/18 1712 05/04/18 0934 09/05/18 1048  BNP 163.3* 141.8* 116.6*   ProBNP (last 3 results) No results for input(s): PROBNP in the last 8760 hours.  D-Dimer No results for input(s): DDIMER in the last 72 hours. Hemoglobin A1C Recent Labs    09/19/18 1440  HGBA1C 10.3*   Fasting Lipid Panel No results for input(s): CHOL, HDL, LDLCALC, TRIG, CHOLHDL, LDLDIRECT in the last 72 hours. Thyroid Function Tests No results for input(s): TSH, T4TOTAL, T3FREE, THYROIDAB in the last 72 hours.  Invalid input(s):  FREET3  Other results:  Imaging    No results found.   Medications:     Scheduled Medications: . apixaban  5 mg Oral BID  . calcium-vitamin D  1 tablet Oral Q M,W,F  . carvedilol  25 mg Oral BID  . doxycycline  100 mg Oral Q12H  . hydrALAZINE  25 mg Oral TID  . insulin aspart  0-15 Units Subcutaneous TID WC  . insulin aspart  0-5 Units Subcutaneous QHS  . insulin aspart  15 Units Subcutaneous QAC lunch  . insulin aspart protamine- aspart  30 Units Subcutaneous BID WC  . Livalo 4 mg tablet  4 mg Oral QAC supper  . potassium chloride  40 mEq Oral Once  . prenatal multivitamin  1 tablet Oral Once per day on Mon Wed Fri  . sodium chloride flush  3 mL Intravenous Q12H    Infusions: . sodium chloride    . furosemide (LASIX) infusion 12 mg/hr (09/22/18 0653)  . magnesium sulfate 1 - 4 g bolus IVPB      PRN Medications: sodium chloride, acetaminophen, ALPRAZolam, meclizine,  nitroGLYCERIN, ondansetron (ZOFRAN) IV, polyvinyl alcohol, sodium chloride, sodium chloride flush, zolpidem  Patient Profile   Dreana P Sayed is a 68 y.o. female with AS s/p TAVR, chronic diastolic CHF, PAF, CKD III, HLD, HTN, OSA, and morbid obesity  Admitted from [facility] office with marked volume overload.   Assessment/Plan   1. Acute on chronic diastolic CHF: Echo this admission with EF 60-65%, moderate LVH, grade 2 diastolic dysfunction, trivial mitral stenosis, mildly dilated RV with mildly decreased systolic function, TAVR valve looks ok, moderate pericardial effusion (no tamponade). On exam, she remains volume overloaded.  She diuresed well yesterday on Lasix gtt but creatinine is up some to 2.43.   - Continue Lasix gtt 12 mg/hr today.   - Cardiac amyloidosis (most likely transthyretin) is a consideration.  Unable to get PYP scan due to orthopnea, will try when she is better diuresed. - Will arrange for RHC next week.  2. CKD stage 3: Follow creatinine closely with diuresis.  Creatinine up mildly to 2.43, follow closely with diuresis.  3. CAD: Cath in 8/19 with moderate RCA and LCx disease, severe distal LAD disease.  No chest pain.  - Continue pitivastatin.  4. Atrial fibrillation: Paroxysmal, in NSR today.   - Continue Eliquis (hold prior to cath).  5. HTN: BP elevated.  - Continue Coreg.  - Continue hydralazine 25 mg tid, will not push BP too hard with rise in creatinine. .  6. Possible RLE cellulitis: Cover with doxycycline for now.  7. LLQ cyst on abdominal US: Needs eventual pelvic CT to evaluate.   [name] 09/22/2018 9:51 AM

## 2018-09-23 ENCOUNTER — Other Ambulatory Visit: Payer: Self-pay | Admitting: Family Medicine

## 2018-09-23 LAB — GLUCOSE, CAPILLARY
Glucose-Capillary: 291 mg/dL — ABNORMAL HIGH (ref 70–99)
Glucose-Capillary: 359 mg/dL — ABNORMAL HIGH (ref 70–99)
Glucose-Capillary: 306 mg/dL — ABNORMAL HIGH (ref 70–99)
Glucose-Capillary: 372 mg/dL — ABNORMAL HIGH (ref 70–99)

## 2018-09-23 LAB — BASIC METABOLIC PANEL
Anion gap: 14 (ref 5–15)
BUN: 51 mg/dL — ABNORMAL HIGH (ref 8–23)
CO2: 31 mmol/L (ref 22–32)
Calcium: 8.8 mg/dL — ABNORMAL LOW (ref 8.9–10.3)
Chloride: 96 mmol/L — ABNORMAL LOW (ref 98–111)
Creatinine, Ser: 3 mg/dL — ABNORMAL HIGH (ref 0.44–1.00)
GFR calc Af Amer: 18 mL/min — ABNORMAL LOW (ref >60)
GFR calc non Af Amer: 15 mL/min — ABNORMAL LOW (ref >60)
Glucose, Bld: 341 mg/dL — ABNORMAL HIGH (ref 70–99)
Potassium: 4 mmol/L (ref 3.5–5.1)
Sodium: 141 mmol/L (ref 135–145)

## 2018-09-23 NOTE — Progress Notes (Signed)
Patient ID: [name], female   DOB: 1951/03/17, 68 y.o.   MRN: [id number]     Advanced Heart Failure Rounding Note  PCP-Cardiologist: Minus Breeding, MD   Subjective:    Good diuresis yesterday, weight down 6 lbs.  Breathing better, less orthopneic.   Cr 1.9 -> 2.39 -> 2.43 -> 3.   PYP scan attempted 09/20/2018, remained too orthopneic.   Objective:   Weight Range: 100.3 kg Body mass index is 34.64 kg/m.   Vital Signs:   Temp:  [98.5 F (36.9 C)-98.7 F (37.1 C)] 98.5 F (36.9 C) (02/09 0408) Pulse Rate:  [57-79] 69 (02/09 0408) Resp:  [18] 18 (02/09 0408) BP: (119-148)/(43-55) 139/43 (02/09 0408) SpO2:  [92 %-94 %] 92 % (02/09 0408) Weight:  [100.3 kg] 100.3 kg (02/09 0408) Last BM Date: 09/21/18  Weight change: Filed Weights   09/21/18 0512 09/22/18 0349 09/23/18 0408  Weight: 107.4 kg 103.4 kg 100.3 kg   Intake/Output:   Intake/Output Summary (Last 24 hours) at 09/23/2018 0826 Last data filed at 09/23/2018 0700 Gross per 24 hour  Intake 679.45 ml  Output 2775 ml  Net -2095.55 ml    Physical Exam    General: NAD Neck: Thick, JVP 8 cm, no thyromegaly or thyroid nodule.  Lungs: Clear to auscultation bilaterally with normal respiratory effort. CV: Nondisplaced PMI.  Heart regular S1/S2, no S3/S4, no murmur.  1+ ankle edema.   Abdomen: Soft, nontender, no hepatosplenomegaly, mild distention.  Skin: Intact without lesions or rashes.  Neurologic: Alert and oriented x 3.  Psych: Normal affect. Extremities: No clubbing or cyanosis. Redness right lower leg.  HEENT: Normal.    Telemetry   NSR 70-80s, personally reviewed.   EKG    No new tracings.    Labs    CBC No results for input(s): WBC, NEUTROABS, HGB, HCT, MCV, PLT in the last 72 hours. Basic Metabolic Panel Recent Labs    09/22/18 0602 09/23/18 0536  NA 142 141  K 3.8 4.0  CL 101 96*  CO2 30 31  GLUCOSE 274* 341*  BUN 39* 51*  CREATININE 2.43* 3.00*  CALCIUM 8.6* 8.8*  MG 1.6*  --      Liver Function Tests No results for input(s): AST, ALT, ALKPHOS, BILITOT, PROT, ALBUMIN in the last 72 hours. No results for input(s): LIPASE, AMYLASE in the last 72 hours. Cardiac Enzymes No results for input(s): CKTOTAL, CKMB, CKMBINDEX, TROPONINI in the last 72 hours.  BNP: BNP (last 3 results) Recent Labs    03/13/18 1712 05/04/18 0934 09/05/18 1048  BNP 163.3* 141.8* 116.6*   ProBNP (last 3 results) No results for input(s): PROBNP in the last 8760 hours.  D-Dimer No results for input(s): DDIMER in the last 72 hours. Hemoglobin A1C No results for input(s): HGBA1C in the last 72 hours. Fasting Lipid Panel No results for input(s): CHOL, HDL, LDLCALC, TRIG, CHOLHDL, LDLDIRECT in the last 72 hours. Thyroid Function Tests No results for input(s): TSH, T4TOTAL, T3FREE, THYROIDAB in the last 72 hours.  Invalid input(s): FREET3  Other results:  Imaging    No results found.   Medications:     Scheduled Medications: . apixaban  5 mg Oral BID  . calcium-vitamin D  1 tablet Oral Q M,W,F  . carvedilol  25 mg Oral BID  . doxycycline  100 mg Oral Q12H  . hydrALAZINE  25 mg Oral TID  . insulin aspart  0-15 Units Subcutaneous TID WC  . insulin aspart  0-5 Units  Subcutaneous QHS  . insulin aspart  15 Units Subcutaneous QAC lunch  . insulin aspart protamine- aspart  30 Units Subcutaneous BID WC  . Livalo 4 mg tablet  4 mg Oral QAC supper  . prenatal multivitamin  1 tablet Oral Once per day on Mon Wed Fri  . sodium chloride flush  3 mL Intravenous Q12H    Infusions: . sodium chloride      PRN Medications: sodium chloride, acetaminophen, ALPRAZolam, meclizine, nitroGLYCERIN, ondansetron (ZOFRAN) IV, polyvinyl alcohol, sodium chloride, sodium chloride flush, zolpidem  Patient Profile   [name] is a 68 y.o. female with AS s/p TAVR, chronic diastolic CHF, PAF, CKD III, HLD, HTN, OSA, and morbid obesity  Admitted from [facility] office with marked volume overload.    Assessment/Plan   1. Acute on chronic diastolic CHF: Echo this admission with EF 60-65%, moderate LVH, grade 2 diastolic dysfunction, trivial mitral stenosis, mildly dilated RV with mildly decreased systolic function, TAVR valve looks ok, moderate pericardial effusion (no tamponade).  She has diuresed well and weight down > 20 lbs, but now creatinine up to 3. Volume status improved on exam.  - Stop Lasix gtt, allow creatinine to equilibrate.  Will eventually start her on torsemide for home.  - Cardiac amyloidosis (most likely transthyretin) is a consideration.  Unable to get PYP scan due to orthopnea, this is better so will try again Monday.  - Will arrange for RHC prior to discharge.   2. AKI on CKD stage 3: Creatinine up to 3, baseline around 2.   - Stop Lasix gtt today and follow creatinine, hopefully will equilibrate.  3. CAD: Cath in 8/19 with moderate RCA and LCx disease, severe distal LAD disease.  No chest pain.  - Continue pitivastatin.  4. Atrial fibrillation: Paroxysmal, in NSR today.   - Continue Eliquis (hold prior to cath which will likely be Tuesday).  5. HTN: SBP stable 130s-140s.  - Continue Coreg.  - Continue hydralazine 25 mg tid, will not push BP too hard with rise in creatinine.   6. Possible RLE cellulitis: Covering with doxycycline.  7. LLQ cyst on abdominal US: Needs eventual pelvic CT to evaluate.   [name] 09/23/2018 8:26 AM

## 2018-09-24 ENCOUNTER — Inpatient Hospital Stay (HOSPITAL_COMMUNITY): Payer: Medicare Other

## 2018-09-24 LAB — BASIC METABOLIC PANEL
Anion gap: 12 (ref 5–15)
BUN: 63 mg/dL — ABNORMAL HIGH (ref 8–23)
CO2: 31 mmol/L (ref 22–32)
Calcium: 8.3 mg/dL — ABNORMAL LOW (ref 8.9–10.3)
Chloride: 98 mmol/L (ref 98–111)
Creatinine, Ser: 2.77 mg/dL — ABNORMAL HIGH (ref 0.44–1.00)
GFR calc Af Amer: 20 mL/min — ABNORMAL LOW (ref >60)
GFR calc non Af Amer: 17 mL/min — ABNORMAL LOW (ref >60)
Glucose, Bld: 385 mg/dL — ABNORMAL HIGH (ref 70–99)
Potassium: 4 mmol/L (ref 3.5–5.1)
Sodium: 141 mmol/L (ref 135–145)

## 2018-09-24 LAB — MULTIPLE MYELOMA PANEL, SERUM
Albumin SerPl Elph-Mcnc: 2.9 g/dL (ref 2.9–4.4)
Albumin/Glob SerPl: 1.2 (ref 0.7–1.7)
Alpha 1: 0.3 g/dL (ref 0.0–0.4)
Alpha2 Glob SerPl Elph-Mcnc: 0.8 g/dL (ref 0.4–1.0)
B-Globulin SerPl Elph-Mcnc: 1 g/dL (ref 0.7–1.3)
Gamma Glob SerPl Elph-Mcnc: 0.5 g/dL (ref 0.4–1.8)
Globulin, Total: 2.5 g/dL (ref 2.2–3.9)
IgA: 234 mg/dL (ref 87–352)
IgG (Immunoglobin G), Serum: 591 mg/dL — ABNORMAL LOW (ref 700–1600)
IgM (Immunoglobulin M), Srm: 33 mg/dL (ref 26–217)
Total Protein ELP: 5.4 g/dL — ABNORMAL LOW (ref 6.0–8.5)

## 2018-09-24 LAB — GLUCOSE, CAPILLARY
Glucose-Capillary: 347 mg/dL — ABNORMAL HIGH (ref 70–99)
Glucose-Capillary: 418 mg/dL — ABNORMAL HIGH (ref 70–99)
Glucose-Capillary: 390 mg/dL — ABNORMAL HIGH (ref 70–99)
Glucose-Capillary: 260 mg/dL — ABNORMAL HIGH (ref 70–99)
Glucose-Capillary: 261 mg/dL — ABNORMAL HIGH (ref 70–99)

## 2018-09-24 MED ORDER — TECHNETIUM TC 99M PYROPHOSPHATE
20.6000 | Freq: Once | INTRAVENOUS | Status: AC
  Administered 2018-09-24: 20.6 via INTRAVENOUS

## 2018-09-24 MED ORDER — SODIUM CHLORIDE 0.9% FLUSH
3.0000 mL | Freq: Two times a day (BID) | INTRAVENOUS | Status: DC
  Administered 2018-09-24 – 2018-09-25 (×2): 3 mL via INTRAVENOUS

## 2018-09-24 MED ORDER — SODIUM CHLORIDE 0.9% FLUSH: 3.0000 mL | INTRAVENOUS | Status: DC | PRN

## 2018-09-24 MED ORDER — SODIUM CHLORIDE 0.9 % IV SOLN: 250.0000 mL | INTRAVENOUS | Status: DC | PRN

## 2018-09-24 MED ORDER — TORSEMIDE 20 MG PO TABS
40.0000 mg | ORAL_TABLET | Freq: Every day | ORAL | Status: DC
  Administered 2018-09-24: 40 mg via ORAL
  Filled 2018-09-24: qty 2

## 2018-09-24 MED ORDER — INSULIN ASPART PROT & ASPART (70-30 MIX) 100 UNIT/ML ~~LOC~~ SUSP
45.0000 [IU] | Freq: Two times a day (BID) | SUBCUTANEOUS | Status: DC
  Administered 2018-09-24 – 2018-09-25 (×3): 45 [IU] via SUBCUTANEOUS

## 2018-09-24 MED ORDER — SODIUM CHLORIDE 0.9 % IV SOLN
INTRAVENOUS | Status: DC
  Administered 2018-09-25: 05:00:00 via INTRAVENOUS

## 2018-09-24 MED ORDER — APIXABAN 5 MG PO TABS: 5.0000 mg | ORAL_TABLET | Freq: Two times a day (BID) | ORAL | Status: DC

## 2018-09-24 NOTE — Progress Notes (Signed)
Patient stated:"i have been feeling sleepy".  Patient is alert and oriented to person, place and situation.  Patient is currently in 2 L nasal canula  Since  1238.    09/24/18 1238  Vitals  BP (!) 129/44  MAP (mmHg) 68  BP Location Right Arm  BP Method Automatic  Patient Position (if appropriate) Lying  Pulse Rate 69  Pulse Rate Source Monitor  Resp 17  Oxygen Therapy  SpO2 (!) 85 %  O2 Device Room Air   Contacted CHF team regarding administration of hydralazine, blood pressure and made aware of patients feeling sleepy. No new orders at this time. Last blood pressure as follows:       09/24/18 1944  Vitals  Temp 97.8 F (36.6 C)  Temp Source Oral  BP (!) 117/33  MAP (mmHg) (!) 58  BP Location Left Arm  BP Method Automatic  Patient Position (if appropriate) Lying  Pulse Rate 71  Resp 18  Oxygen Therapy  O2 Device Nasal Cannula  O2 Flow Rate (L/min) 2 L/min

## 2018-09-24 NOTE — Plan of Care (Signed)
  Problem: Education: Goal: Knowledge of General Education information will improve Description Including pain rating scale, medication(s)/side effects and non-pharmacologic comfort measures Outcome: Progressing   Problem: Clinical Measurements: Goal: Ability to maintain clinical measurements within normal limits will improve Outcome: Progressing   Problem: Clinical Measurements: Goal: Diagnostic test results will improve Outcome: Progressing   Problem: Nutrition: Goal: Adequate nutrition will be maintained Outcome: Progressing   Problem: Elimination: Goal: Will not experience complications related to bowel motility Outcome: Progressing

## 2018-09-24 NOTE — Progress Notes (Signed)
Inpatient Diabetes Program Recommendations  AACE/ADA: New Consensus Statement on Inpatient Glycemic Control (2015)  Target Ranges:  Prepandial:   less than 140 mg/dL      Peak postprandial:   less than 180 mg/dL (1-2 hours)      Critically ill patients:  140 - 180 mg/dL   Lab Results  Component Value Date   GLUCAP 418 (H) 09/24/2018   HGBA1C 10.3 (H) 09/19/2018    Review of Glycemic Control Results for [name], [name] (MRN [id number]) as of 09/24/2018 12:15  Ref. Range 09/22/2018 07:39 09/22/2018 12:00 09/22/2018 16:27 09/22/2018 20:50 09/23/2018 07:48 09/23/2018 11:47 09/23/2018 16:37 09/23/2018 20:36 09/24/2018 07:47 09/24/2018 10:22  Glucose-Capillary Latest Ref Range: 70 - 99 mg/dL 254 (H) 363 (H) 300 (H) 328 (H) 291 (H) 359 (H) 306 (H) 372 (H) 347 (H) 418 (H)   Diabetes history: Type 2 DM  Outpatient Diabetes medications: Novolin 70/30 65-20-65 units tidwc Current orders for Inpatient glycemic control:  Novolog 70/30 30 units bid, Novolog 15 units Q ac Lunch, Novolog 0-15 units tidwc and hs Inpatient Diabetes Program Recommendations:   Please consider increasing Novolog 70/30 to 45 units bid.    Thanks,  Adah Perl, RN, BC-ADM Inpatient Diabetes Coordinator Pager [phone] (8a-5p)

## 2018-09-24 NOTE — Progress Notes (Addendum)
Patient ID: [name], female   DOB: Jun 09, 1951, 68 y.o.   MRN: [id number]     Advanced Heart Failure Rounding Note  PCP-Cardiologist: Minus Breeding, MD   Subjective:    Lasix drip stopped yesterday with AKI. Cr 1.9 -> 2.39 -> 2.43 -> 3 -> 2.77. Weight down another 3 lbs. SBP 120-150s  PYP scan attempted 09/20/2018, remained too orthopneic.   No CP or SOB. No orthopnea. Says she can do PYP today as long as it's not right after a meal. Had some nausea and vomiting overnight after turning in bed. Still feels like she has fluid in her belly. Cellulitis is getting better.  Objective:   Weight Range: 99.3 kg Body mass index is 34.28 kg/m.   Vital Signs:   Temp:  [97.4 F (36.3 C)-98.5 F (36.9 C)] 98.2 F (36.8 C) (02/10 0501) Pulse Rate:  [59-80] 68 (02/10 0501) Resp:  [18-20] 18 (02/10 0501) BP: (123-157)/(39-59) 129/39 (02/10 0501) SpO2:  [91 %-93 %] 93 % (02/10 0501) Weight:  [99.3 kg] 99.3 kg (02/10 0501) Last BM Date: 09/22/18  Weight change: Filed Weights   09/22/18 0349 09/23/18 0408 09/24/18 0501  Weight: 103.4 kg 100.3 kg 99.3 kg   Intake/Output:   Intake/Output Summary (Last 24 hours) at 09/24/2018 0735 Last data filed at 09/24/2018 0510 Gross per 24 hour  Intake 161.5 ml  Output 1050 ml  Net -888.5 ml    Physical Exam    General: No resp difficulty. Obese. HEENT: Normal Neck: Thick. JVP difficult, but does not appear too elevated. Carotids 2+ bilat; no bruits. No thyromegaly or nodule noted. Cor: PMI nondisplaced. RRR, No M/G/R noted Lungs: CTAB, normal effort. Abdomen: Obese, soft, non-tender, non-distended, no HSM. No bruits or masses. +BS  Extremities: No cyanosis, clubbing, or rash. R and LLE no edema. RLE errhythemia   Neuro: Alert & orientedx3, cranial nerves grossly intact. moves all 4 extremities w/o difficulty. Affect pleasant   Telemetry   NSR 70s, personally reviewed.   EKG    No new tracings.   Labs    CBC No results for  input(s): WBC, NEUTROABS, HGB, HCT, MCV, PLT in the last 72 hours. Basic Metabolic Panel Recent Labs    09/22/18 0602 09/23/18 0536 09/24/18 0622  NA 142 141 141  K 3.8 4.0 4.0  CL 101 96* 98  CO2 30 31 31   GLUCOSE 274* 341* 385*  BUN 39* 51* 63*  CREATININE 2.43* 3.00* 2.77*  CALCIUM 8.6* 8.8* 8.3*  MG 1.6*  --   --    Liver Function Tests No results for input(s): AST, ALT, ALKPHOS, BILITOT, PROT, ALBUMIN in the last 72 hours. No results for input(s): LIPASE, AMYLASE in the last 72 hours. Cardiac Enzymes No results for input(s): CKTOTAL, CKMB, CKMBINDEX, TROPONINI in the last 72 hours.  BNP: BNP (last 3 results) Recent Labs    03/13/18 1712 05/04/18 0934 09/05/18 1048  BNP 163.3* 141.8* 116.6*   ProBNP (last 3 results) No results for input(s): PROBNP in the last 8760 hours.  D-Dimer No results for input(s): DDIMER in the last 72 hours. Hemoglobin A1C No results for input(s): HGBA1C in the last 72 hours. Fasting Lipid Panel No results for input(s): CHOL, HDL, LDLCALC, TRIG, CHOLHDL, LDLDIRECT in the last 72 hours. Thyroid Function Tests No results for input(s): TSH, T4TOTAL, T3FREE, THYROIDAB in the last 72 hours.  Invalid input(s): FREET3  Other results:  Imaging    No results found.   Medications:  Scheduled Medications: . apixaban  5 mg Oral BID  . calcium-vitamin D  1 tablet Oral Q M,W,F  . carvedilol  25 mg Oral BID  . doxycycline  100 mg Oral Q12H  . hydrALAZINE  25 mg Oral TID  . insulin aspart  0-15 Units Subcutaneous TID WC  . insulin aspart  0-5 Units Subcutaneous QHS  . insulin aspart  15 Units Subcutaneous QAC lunch  . insulin aspart protamine- aspart  30 Units Subcutaneous BID WC  . Livalo 4 mg tablet  4 mg Oral QAC supper  . prenatal multivitamin  1 tablet Oral Once per day on Mon Wed Fri  . sodium chloride flush  3 mL Intravenous Q12H    Infusions: . sodium chloride      PRN Medications: sodium chloride, acetaminophen,  ALPRAZolam, meclizine, nitroGLYCERIN, ondansetron (ZOFRAN) IV, polyvinyl alcohol, sodium chloride, sodium chloride flush, zolpidem  Patient Profile   Frimy P Hatton is a 67 y.o. female with AS s/p TAVR, chronic diastolic CHF, PAF, CKD III, HLD, HTN, OSA, and morbid obesity  Admitted from [facility] office with marked volume overload.   Assessment/Plan   1. Acute on chronic diastolic CHF: Echo this admission with EF 60-65%, moderate LVH, grade 2 diastolic dysfunction, trivial mitral stenosis, mildly dilated RV with mildly decreased systolic function, TAVR valve looks ok, moderate pericardial effusion (no tamponade).  She has diuresed well and weight down > 20 lbs. Diuretics held yesterday. Creatinine improving 3.0 -> 2.77. Volume looks okay on exam. - Start torsemide 40 mg daily. She was on lasix 20 mg daily PTA.  - Cardiac amyloidosis (most likely transthyretin) is a consideration.  Unable to get PYP scan due to orthopnea. She is on schedule for PYP again today. Spoke with nuc med this am - will likely be this afternoon.  - Will arrange for RHC prior to discharge. Will discuss timing with MD- possibly tomorrow.  2. AKI on CKD stage 3: baseline creatinine around 2.   - Creatinine improving 3.0 -> 2.77. 3. CAD: Cath in 8/19 with moderate RCA and LCx disease, severe distal LAD disease.  No chest pain.  - Continue pitivastatin.  4. Atrial fibrillation: Paroxysmal. Maintaining NSR.  - Continue Eliquis (hold prior to cath which will likely be Tuesday).  5. HTN: SBP stable 120-150s - Continue Coreg.  - Continue hydralazine 25 mg tid, will not push BP too hard with rise in creatinine.   6. Possible RLE cellulitis: Covering with doxycycline. No change.   7. LLQ cyst on abdominal US: Needs eventual pelvic CT to evaluate. No change.   PYP today. Likely oral diuretics. Probable RHC tomorrow (will need Eliquis hold Tuesday am)  Georgiana Shore, NP 09/24/2018 7:35 AM  Patient seen with NP, agree with  the above note.    Creatinine improving, volume looks better.    Plan PYP scan today, RHC tomorrow. Risks/benefits discussed with patient, she agrees to procedure.    Restart diuretic po => torsemide 40 mg daily, follow creatinine.   [name] 09/24/2018 1:49 PM

## 2018-09-25 ENCOUNTER — Encounter (HOSPITAL_COMMUNITY)
Admission: AD | Disposition: A | Discharge status: Home Health | Payer: Self-pay | Source: Ambulatory Visit | Attending: Cardiology

## 2018-09-25 ENCOUNTER — Encounter (HOSPITAL_COMMUNITY): Payer: Self-pay | Admitting: Cardiology

## 2018-09-25 DIAGNOSIS — I509 Heart failure, unspecified: Secondary | ICD-10-CM

## 2018-09-25 HISTORY — PX: RIGHT HEART CATH: CATH118263

## 2018-09-25 LAB — BASIC METABOLIC PANEL
Anion gap: 13 (ref 5–15)
BUN: 67 mg/dL — ABNORMAL HIGH (ref 8–23)
CO2: 31 mmol/L (ref 22–32)
Calcium: 8.6 mg/dL — ABNORMAL LOW (ref 8.9–10.3)
Chloride: 98 mmol/L (ref 98–111)
Creatinine, Ser: 3.12 mg/dL — ABNORMAL HIGH (ref 0.44–1.00)
GFR calc Af Amer: 17 mL/min — ABNORMAL LOW (ref >60)
GFR calc non Af Amer: 15 mL/min — ABNORMAL LOW (ref >60)
Glucose, Bld: 248 mg/dL — ABNORMAL HIGH (ref 70–99)
Potassium: 4.3 mmol/L (ref 3.5–5.1)
Sodium: 142 mmol/L (ref 135–145)

## 2018-09-25 LAB — POCT I-STAT EG7
Acid-Base Excess: 10 mmol/L — ABNORMAL HIGH (ref 0.0–2.0)
Acid-Base Excess: 11 mmol/L — ABNORMAL HIGH (ref 0.0–2.0)
Bicarbonate: 36.9 mmol/L — ABNORMAL HIGH (ref 20.0–28.0)
Bicarbonate: 37.9 mmol/L — ABNORMAL HIGH (ref 20.0–28.0)
Calcium, Ion: 1.12 mmol/L — ABNORMAL LOW (ref 1.15–1.40)
Calcium, Ion: 1.14 mmol/L — ABNORMAL LOW (ref 1.15–1.40)
HCT: 34 % — ABNORMAL LOW (ref 36.0–46.0)
HCT: 34 % — ABNORMAL LOW (ref 36.0–46.0)
Hemoglobin: 11.6 g/dL — ABNORMAL LOW (ref 12.0–15.0)
Hemoglobin: 11.6 g/dL — ABNORMAL LOW (ref 12.0–15.0)
O2 Saturation: 63 %
O2 Saturation: 64 %
Potassium: 4.3 mmol/L (ref 3.5–5.1)
Potassium: 4.6 mmol/L (ref 3.5–5.1)
Sodium: 142 mmol/L (ref 135–145)
Sodium: 142 mmol/L (ref 135–145)
TCO2: 39 mmol/L — ABNORMAL HIGH (ref 22–32)
TCO2: 40 mmol/L — ABNORMAL HIGH (ref 22–32)
pCO2, Ven: 62 mmHg — ABNORMAL HIGH (ref 44.0–60.0)
pCO2, Ven: 62.6 mmHg — ABNORMAL HIGH (ref 44.0–60.0)
pH, Ven: 7.382 (ref 7.250–7.430)
pH, Ven: 7.39 (ref 7.250–7.430)
pO2, Ven: 34 mmHg (ref 32.0–45.0)
pO2, Ven: 35 mmHg (ref 32.0–45.0)

## 2018-09-25 LAB — CBC
HCT: 36.9 % (ref 36.0–46.0)
Hemoglobin: 10.8 g/dL — ABNORMAL LOW (ref 12.0–15.0)
MCH: 25.2 pg — ABNORMAL LOW (ref 26.0–34.0)
MCHC: 29.3 g/dL — ABNORMAL LOW (ref 30.0–36.0)
MCV: 86 fL (ref 80.0–100.0)
Platelets: 164 10*3/uL (ref 150–400)
RBC: 4.29 MIL/uL (ref 3.87–5.11)
RDW: 14.8 % (ref 11.5–15.5)
WBC: 8.2 10*3/uL (ref 4.0–10.5)
nRBC: 0 % (ref 0.0–0.2)

## 2018-09-25 LAB — GLUCOSE, CAPILLARY
Glucose-Capillary: 242 mg/dL — ABNORMAL HIGH (ref 70–99)
Glucose-Capillary: 201 mg/dL — ABNORMAL HIGH (ref 70–99)
Glucose-Capillary: 328 mg/dL — ABNORMAL HIGH (ref 70–99)
Glucose-Capillary: 311 mg/dL — ABNORMAL HIGH (ref 70–99)

## 2018-09-25 SURGERY — RIGHT HEART CATH
Anesthesia: LOCAL

## 2018-09-25 MED ORDER — INSULIN ASPART 100 UNIT/ML ~~LOC~~ SOLN
0.0000 [IU] | Freq: Three times a day (TID) | SUBCUTANEOUS | Status: DC
  Administered 2018-09-26: 11 [IU] via SUBCUTANEOUS
  Administered 2018-09-26: 5 [IU] via SUBCUTANEOUS
  Administered 2018-09-26 – 2018-09-27 (×3): 8 [IU] via SUBCUTANEOUS
  Administered 2018-09-27: 3 [IU] via SUBCUTANEOUS
  Administered 2018-09-28: 8 [IU] via SUBCUTANEOUS
  Administered 2018-09-28: 3 [IU] via SUBCUTANEOUS

## 2018-09-25 MED ORDER — INSULIN ASPART PROT & ASPART (70-30 MIX) 100 UNIT/ML ~~LOC~~ SUSP
45.0000 [IU] | Freq: Two times a day (BID) | SUBCUTANEOUS | Status: DC
  Administered 2018-09-26: 45 [IU] via SUBCUTANEOUS

## 2018-09-25 MED ORDER — LIDOCAINE HCL (PF) 1 % IJ SOLN
INTRAMUSCULAR | Status: AC
  Filled 2018-09-25: qty 30

## 2018-09-25 MED ORDER — FUROSEMIDE 10 MG/ML IJ SOLN
80.0000 mg | Freq: Once | INTRAMUSCULAR | Status: AC
  Administered 2018-09-25: 80 mg via INTRAVENOUS
  Filled 2018-09-25: qty 8

## 2018-09-25 MED ORDER — MAGNESIUM HYDROXIDE 400 MG/5ML PO SUSP
30.0000 mL | Freq: Every day | ORAL | Status: DC | PRN
  Administered 2018-09-25: 30 mL via ORAL
  Filled 2018-09-25: qty 30

## 2018-09-25 MED ORDER — LIDOCAINE HCL (PF) 1 % IJ SOLN
INTRAMUSCULAR | Status: DC | PRN
  Administered 2018-09-25: 2 mL

## 2018-09-25 MED ORDER — APIXABAN 5 MG PO TABS
5.0000 mg | ORAL_TABLET | Freq: Two times a day (BID) | ORAL | Status: DC
  Administered 2018-09-25 – 2018-09-28 (×6): 5 mg via ORAL
  Filled 2018-09-25 (×7): qty 1

## 2018-09-25 MED ORDER — FUROSEMIDE 10 MG/ML IJ SOLN
12.0000 mg/h | INTRAVENOUS | Status: DC
  Administered 2018-09-25 – 2018-09-27 (×4): 12 mg/h via INTRAVENOUS
  Filled 2018-09-25: qty 25
  Filled 2018-09-25 (×2): qty 21

## 2018-09-25 MED ORDER — HEPARIN (PORCINE) IN NACL 1000-0.9 UT/500ML-% IV SOLN
INTRAVENOUS | Status: AC
  Filled 2018-09-25: qty 500

## 2018-09-25 SURGICAL SUPPLY — 7 items
CATH BALLN WEDGE 5F 110CM (CATHETERS) ×1 IMPLANT
PACK CARDIAC CATHETERIZATION (CUSTOM PROCEDURE TRAY) ×2 IMPLANT
SHEATH GLIDE SLENDER 4/5FR (SHEATH) ×1 IMPLANT
TRANSDUCER W/STOPCOCK (MISCELLANEOUS) ×2 IMPLANT
TUBING ART PRESS 72  MALE/FEM (TUBING) ×1
TUBING ART PRESS 72 MALE/FEM (TUBING) IMPLANT
TUBING CIL FLEX 10 FLL-RA (TUBING) ×1 IMPLANT

## 2018-09-25 NOTE — Progress Notes (Signed)
Patient ID: [name], female   DOB: [birth year], 68 y.o.   MRN: [id number]     Advanced Heart Failure Rounding Note  PCP-Cardiologist: Minus Breeding, MD   Subjective:    Lasix drip stopped yesterday with AKI. Cr 1.9 -> 2.39 -> 2.43 -> 3 -> 2.77 -> 3.12. Weight fairly stable.  SBP 120s-130s.   PYP scan: H/CL 1.8, grade 2 visual.   No dyspnea, feels like breathing is better.   Objective:   Weight Range: 99.7 kg Body mass index is 34.44 kg/m.   Vital Signs:   Temp:  [97.4 F (36.3 C)-97.9 F (36.6 C)] 97.4 F (36.3 C) (02/11 0837) Pulse Rate:  [62-73] 68 (02/11 0837) Resp:  [17-19] 17 (02/11 0837) BP: (117-175)/(33-57) 127/50 (02/11 0837) SpO2:  [85 %-97 %] 95 % (02/11 0837) Weight:  [99.7 kg] 99.7 kg (02/11 0132) Last BM Date: 09/24/18  Weight change: Filed Weights   09/23/18 0408 09/24/18 0501 09/25/18 0132  Weight: 100.3 kg 99.3 kg 99.7 kg   Intake/Output:   Intake/Output Summary (Last 24 hours) at 09/25/2018 0927 Last data filed at 09/25/2018 0514 Gross per 24 hour  Intake 487.71 ml  Output 1150 ml  Net -662.29 ml    Physical Exam    General: NAD Neck: Thick, JVP difficult, no thyromegaly or thyroid nodule.  Lungs: Clear to auscultation bilaterally with normal respiratory effort. CV: Nonpalpable PMI.  Heart regular S1/S2, no S3/S4, no murmur.  1+ ankle edema.   Abdomen: Soft, nontender, no hepatosplenomegaly, no distention.  Skin: Intact without lesions or rashes.  Neurologic: Alert and oriented x 3.  Psych: Normal affect. Extremities: No clubbing or cyanosis. Redness right lower leg.  HEENT: Normal.    Telemetry   NSR 70s, personally reviewed.   EKG    No new tracings.   Labs    CBC Recent Labs    09/25/18 0502  WBC 8.2  HGB 10.8*  HCT 36.9  MCV 86.0  PLT 791   Basic Metabolic Panel Recent Labs    09/24/18 0622 09/25/18 0502  NA 141 142  K 4.0 4.3  CL 98 98  CO2 31 31  GLUCOSE 385* 248*  BUN 63* 67*  CREATININE 2.77*  3.12*  CALCIUM 8.3* 8.6*   Liver Function Tests No results for input(s): AST, ALT, ALKPHOS, BILITOT, PROT, ALBUMIN in the last 72 hours. No results for input(s): LIPASE, AMYLASE in the last 72 hours. Cardiac Enzymes No results for input(s): CKTOTAL, CKMB, CKMBINDEX, TROPONINI in the last 72 hours.  BNP: BNP (last 3 results) Recent Labs    03/13/18 1712 05/04/18 0934 09/05/18 1048  BNP 163.3* 141.8* 116.6*   ProBNP (last 3 results) No results for input(s): PROBNP in the last 8760 hours.  D-Dimer No results for input(s): DDIMER in the last 72 hours. Hemoglobin A1C No results for input(s): HGBA1C in the last 72 hours. Fasting Lipid Panel No results for input(s): CHOL, HDL, LDLCALC, TRIG, CHOLHDL, LDLDIRECT in the last 72 hours. Thyroid Function Tests No results for input(s): TSH, T4TOTAL, T3FREE, THYROIDAB in the last 72 hours.  Invalid input(s): FREET3  Other results:  Imaging    Nm Cardiac Amyloid Tumor Loc Inflam Spect 1 Day  Result Date: 09/24/2018 CLINICAL DATA:  HEART FAILURE. CONCERN FOR CARDIAC AMYLOIDOSIS. EXAM: NUCLEAR MEDICINE TUMOR LOCALIZATION. PYP CARDIAC AMYLOIDOSIS SCAN WITH SPECT TECHNIQUE: Following intravenous administration of radiopharmaceutical, anterior planar images of the chest were obtained. Regions of interest were placed on the heart and contralateral chest wall  for quantitative assessment. Additional SPECT imaging of the chest was obtained. RADIOPHARMACEUTICALS:  20.6 mCi TECHNETIUM 99 PYROPHOSPHATE FINDINGS: Planar Visual assessment: Anterior planar imaging demonstrates radiotracer uptake within the heart equal to uptake within the adjacent ribs (Grade 2). Quantitative assessment : Quantitative assessment of the cardiac uptake compared to the contralateral chest wall is equal to 1.81 (H/CL = 1.81). SPECT assessment: SPECT imaging of the chest demonstrates moderate radiotracer accumulation within the LEFT ventricle. IMPRESSION: Visual and quantitative  assessment (grade 2, H/CL equal 1.8) are suggestive of transthyretin amyloidosis. Electronically Signed   By: Suzy Bouchard M.D.   On: 09/24/2018 15:57     Medications:     Scheduled Medications: . calcium-vitamin D  1 tablet Oral Q M,W,F  . carvedilol  25 mg Oral BID  . doxycycline  100 mg Oral Q12H  . hydrALAZINE  25 mg Oral TID  . insulin aspart  0-15 Units Subcutaneous TID WC  . insulin aspart  0-5 Units Subcutaneous QHS  . insulin aspart  15 Units Subcutaneous QAC lunch  . insulin aspart protamine- aspart  45 Units Subcutaneous BID WC  . Livalo 4 mg tablet  4 mg Oral QAC supper  . prenatal multivitamin  1 tablet Oral Once per day on Mon Wed Fri  . sodium chloride flush  3 mL Intravenous Q12H  . sodium chloride flush  3 mL Intravenous Q12H    Infusions: . sodium chloride    . sodium chloride    . sodium chloride 10 mL/hr at 09/25/18 0514    PRN Medications: sodium chloride, sodium chloride, acetaminophen, ALPRAZolam, meclizine, nitroGLYCERIN, ondansetron (ZOFRAN) IV, polyvinyl alcohol, sodium chloride, sodium chloride flush, sodium chloride flush, zolpidem  Patient Profile   [name] is a 68 y.o. female with AS s/p TAVR, chronic diastolic CHF, PAF, CKD III, HLD, HTN, OSA, and morbid obesity  Admitted from [facility] office with marked volume overload.   Assessment/Plan   1. Acute on chronic diastolic CHF: Echo this admission with EF 60-65%, moderate LVH, grade 2 diastolic dysfunction, trivial mitral stenosis, mildly dilated RV with mildly decreased systolic function, TAVR valve looks ok, moderate pericardial effusion (no tamponade).  She has diuresed well and weight down > 20 lbs.  PYP scan suggestive of transthyretin amyloidosis, myeloma panel negative.  Torsemide started yesterday but creatinine up to 3.12 today from 2.7.  Exam difficult for volume but does not look significantly overloaded.  - Hold torsemide today, plan for RHC later this morning.  Risks/benefits  discussed with patient, she agrees to procedure.  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  Will need genetic testing as outpatient and will need to work on getting her tafamidis.   2. AKI on CKD stage 3: baseline creatinine around 2.  Creatinine 3 -> 2.7 -> 3.1.  - Hold torsemide for now, RHC today.  3. CAD: Cath in 8/19 with moderate RCA and LCx disease, severe distal LAD disease.  No chest pain.  - Continue pitivastatin.  4. Atrial fibrillation: Paroxysmal. Maintaining NSR.  - Eliquis held for cath, will need to restart afterwards.  5. HTN: SBP stable 120-150s - Continue Coreg.  - Continue hydralazine 25 mg tid, will not push BP too hard with rise in creatinine.   6. Possible RLE cellulitis: Covering with doxycycline. No change.   7. LLQ cyst on abdominal US: Needs eventual pelvic CT to evaluate. No change.   [name] 09/25/2018 9:27 AM

## 2018-09-25 NOTE — Consult Note (Signed)
   [facility] CM Inpatient Consult   09/25/2018  [name] August 28, 1950 [id number]  Patient screened for high risk score and hospitalizations to check if potential [place] [facility] services are needed . Patient was hospitalized for HF exacerbation.  Patient just returning from the cath lab.  Patient with Medicare and Dr. Redge Gainer is the primary care provider (office does the transition of care follow up).  Will follow for progress and needs.   Please place a [facility] Care Management consult or for questions contact:   [name], RN BSN [facility] Liaison  [phone] business mobile phone Toll free office [phone]

## 2018-09-25 NOTE — H&P (View-Only) (Signed)
Patient ID: SHALISHA CLAUSING, female   DOB: 02-27-1951, 68 y.o.   MRN: [id number]     Advanced Heart Failure Rounding Note  PCP-Cardiologist: Minus Breeding, MD   Subjective:    Lasix drip stopped yesterday with AKI. Cr 1.9 -> 2.39 -> 2.43 -> 3 -> 2.77 -> 3.12. Weight fairly stable.  SBP 120s-130s.   PYP scan: H/CL 1.8, grade 2 visual.   No dyspnea, feels like breathing is better.   Objective:   Weight Range: 99.7 kg Body mass index is 34.44 kg/m.   Vital Signs:   Temp:  [97.4 F (36.3 C)-97.9 F (36.6 C)] 97.4 F (36.3 C) (02/11 0837) Pulse Rate:  [62-73] 68 (02/11 0837) Resp:  [17-19] 17 (02/11 0837) BP: (117-175)/(33-57) 127/50 (02/11 0837) SpO2:  [85 %-97 %] 95 % (02/11 0837) Weight:  [99.7 kg] 99.7 kg (02/11 0132) Last BM Date: 09/24/18  Weight change: Filed Weights   09/23/18 0408 09/24/18 0501 09/25/18 0132  Weight: 100.3 kg 99.3 kg 99.7 kg   Intake/Output:   Intake/Output Summary (Last 24 hours) at 09/25/2018 0927 Last data filed at 09/25/2018 0514 Gross per 24 hour  Intake 487.71 ml  Output 1150 ml  Net -662.29 ml    Physical Exam    General: NAD Neck: Thick, JVP difficult, no thyromegaly or thyroid nodule.  Lungs: Clear to auscultation bilaterally with normal respiratory effort. CV: Nonpalpable PMI.  Heart regular S1/S2, no S3/S4, no murmur.  1+ ankle edema.   Abdomen: Soft, nontender, no hepatosplenomegaly, no distention.  Skin: Intact without lesions or rashes.  Neurologic: Alert and oriented x 3.  Psych: Normal affect. Extremities: No clubbing or cyanosis. Redness right lower leg.  HEENT: Normal.    Telemetry   NSR 70s, personally reviewed.   EKG    No new tracings.   Labs    CBC Recent Labs    09/25/18 0502  WBC 8.2  HGB 10.8*  HCT 36.9  MCV 86.0  PLT 778   Basic Metabolic Panel Recent Labs    09/24/18 0622 09/25/18 0502  NA 141 142  K 4.0 4.3  CL 98 98  CO2 31 31  GLUCOSE 385* 248*  BUN 63* 67*  CREATININE 2.77*  3.12*  CALCIUM 8.3* 8.6*   Liver Function Tests No results for input(s): AST, ALT, ALKPHOS, BILITOT, PROT, ALBUMIN in the last 72 hours. No results for input(s): LIPASE, AMYLASE in the last 72 hours. Cardiac Enzymes No results for input(s): CKTOTAL, CKMB, CKMBINDEX, TROPONINI in the last 72 hours.  BNP: BNP (last 3 results) Recent Labs    03/13/18 1712 05/04/18 0934 09/05/18 1048  BNP 163.3* 141.8* 116.6*   ProBNP (last 3 results) No results for input(s): PROBNP in the last 8760 hours.  D-Dimer No results for input(s): DDIMER in the last 72 hours. Hemoglobin A1C No results for input(s): HGBA1C in the last 72 hours. Fasting Lipid Panel No results for input(s): CHOL, HDL, LDLCALC, TRIG, CHOLHDL, LDLDIRECT in the last 72 hours. Thyroid Function Tests No results for input(s): TSH, T4TOTAL, T3FREE, THYROIDAB in the last 72 hours.  Invalid input(s): FREET3  Other results:  Imaging    Nm Cardiac Amyloid Tumor Loc Inflam Spect 1 Day  Result Date: 09/24/2018 CLINICAL DATA:  HEART FAILURE. CONCERN FOR CARDIAC AMYLOIDOSIS. EXAM: NUCLEAR MEDICINE TUMOR LOCALIZATION. PYP CARDIAC AMYLOIDOSIS SCAN WITH SPECT TECHNIQUE: Following intravenous administration of radiopharmaceutical, anterior planar images of the chest were obtained. Regions of interest were placed on the heart and contralateral chest wall  for quantitative assessment. Additional SPECT imaging of the chest was obtained. RADIOPHARMACEUTICALS:  20.6 mCi TECHNETIUM 99 PYROPHOSPHATE FINDINGS: Planar Visual assessment: Anterior planar imaging demonstrates radiotracer uptake within the heart equal to uptake within the adjacent ribs (Grade 2). Quantitative assessment : Quantitative assessment of the cardiac uptake compared to the contralateral chest wall is equal to 1.81 (H/CL = 1.81). SPECT assessment: SPECT imaging of the chest demonstrates moderate radiotracer accumulation within the LEFT ventricle. IMPRESSION: Visual and quantitative  assessment (grade 2, H/CL equal 1.8) are suggestive of transthyretin amyloidosis. Electronically Signed   By: Suzy Bouchard M.D.   On: 09/24/2018 15:57     Medications:     Scheduled Medications: . calcium-vitamin D  1 tablet Oral Q M,W,F  . carvedilol  25 mg Oral BID  . doxycycline  100 mg Oral Q12H  . hydrALAZINE  25 mg Oral TID  . insulin aspart  0-15 Units Subcutaneous TID WC  . insulin aspart  0-5 Units Subcutaneous QHS  . insulin aspart  15 Units Subcutaneous QAC lunch  . insulin aspart protamine- aspart  45 Units Subcutaneous BID WC  . Livalo 4 mg tablet  4 mg Oral QAC supper  . prenatal multivitamin  1 tablet Oral Once per day on Mon Wed Fri  . sodium chloride flush  3 mL Intravenous Q12H  . sodium chloride flush  3 mL Intravenous Q12H    Infusions: . sodium chloride    . sodium chloride    . sodium chloride 10 mL/hr at 09/25/18 0514    PRN Medications: sodium chloride, sodium chloride, acetaminophen, ALPRAZolam, meclizine, nitroGLYCERIN, ondansetron (ZOFRAN) IV, polyvinyl alcohol, sodium chloride, sodium chloride flush, sodium chloride flush, zolpidem  Patient Profile   Elisama P Braaksma is a 68 y.o. female with AS s/p TAVR, chronic diastolic CHF, PAF, CKD III, HLD, HTN, OSA, and morbid obesity  Admitted from [facility] office with marked volume overload.   Assessment/Plan   1. Acute on chronic diastolic CHF: Echo this admission with EF 60-65%, moderate LVH, grade 2 diastolic dysfunction, trivial mitral stenosis, mildly dilated RV with mildly decreased systolic function, TAVR valve looks ok, moderate pericardial effusion (no tamponade).  She has diuresed well and weight down > 20 lbs.  PYP scan suggestive of transthyretin amyloidosis, myeloma panel negative.  Torsemide started yesterday but creatinine up to 3.12 today from 2.7.  Exam difficult for volume but does not look significantly overloaded.  - Hold torsemide today, plan for RHC later this morning.  Risks/benefits  discussed with patient, she agrees to procedure.  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  Will need genetic testing as outpatient and will need to work on getting her tafamidis.   2. AKI on CKD stage 3: baseline creatinine around 2.  Creatinine 3 -> 2.7 -> 3.1.  - Hold torsemide for now, RHC today.  3. CAD: Cath in 8/19 with moderate RCA and LCx disease, severe distal LAD disease.  No chest pain.  - Continue pitivastatin.  4. Atrial fibrillation: Paroxysmal. Maintaining NSR.  - Eliquis held for cath, will need to restart afterwards.  5. HTN: SBP stable 120-150s - Continue Coreg.  - Continue hydralazine 25 mg tid, will not push BP too hard with rise in creatinine.   6. Possible RLE cellulitis: Covering with doxycycline. No change.   7. LLQ cyst on abdominal US: Needs eventual pelvic CT to evaluate. No change.   [name] 09/25/2018 9:27 AM

## 2018-09-25 NOTE — Care Management Important Message (Signed)
Important Message  Patient Details  Name: [name] MRN: [id number] Date of Birth: [birth year]   Medicare Important Message Given:  Yes    Barb Merino [place] 09/25/2018, 4:41 PM

## 2018-09-25 NOTE — Interval H&P Note (Signed)
History and Physical Interval Note:  09/25/2018 12:21 PM  [name]  has presented today for surgery, with the diagnosis of hf  The various methods of treatment have been discussed with the patient and family. After consideration of risks, benefits and other options for treatment, the patient has consented to  Procedure(s): RIGHT HEART CATH (N/A) as a surgical intervention .  The patient's history has been reviewed, patient examined, no change in status, stable for surgery.  I have reviewed the patient's chart and labs.  Questions were answered to the patient's satisfaction.     Samual Beals [employer]

## 2018-09-26 DIAGNOSIS — I4891 Unspecified atrial fibrillation: Secondary | ICD-10-CM

## 2018-09-26 LAB — BASIC METABOLIC PANEL
Anion gap: 13 (ref 5–15)
BUN: 73 mg/dL — ABNORMAL HIGH (ref 8–23)
CO2: 33 mmol/L — ABNORMAL HIGH (ref 22–32)
Calcium: 8.5 mg/dL — ABNORMAL LOW (ref 8.9–10.3)
Chloride: 96 mmol/L — ABNORMAL LOW (ref 98–111)
Creatinine, Ser: 2.83 mg/dL — ABNORMAL HIGH (ref 0.44–1.00)
GFR calc Af Amer: 19 mL/min — ABNORMAL LOW (ref >60)
GFR calc non Af Amer: 17 mL/min — ABNORMAL LOW (ref >60)
Glucose, Bld: 211 mg/dL — ABNORMAL HIGH (ref 70–99)
Potassium: 4.2 mmol/L (ref 3.5–5.1)
Sodium: 142 mmol/L (ref 135–145)

## 2018-09-26 LAB — GLUCOSE, CAPILLARY
Glucose-Capillary: 197 mg/dL — ABNORMAL HIGH (ref 70–99)
Glucose-Capillary: 217 mg/dL — ABNORMAL HIGH (ref 70–99)
Glucose-Capillary: 323 mg/dL — ABNORMAL HIGH (ref 70–99)
Glucose-Capillary: 271 mg/dL — ABNORMAL HIGH (ref 70–99)
Glucose-Capillary: 264 mg/dL — ABNORMAL HIGH (ref 70–99)

## 2018-09-26 LAB — MAGNESIUM: Magnesium: 2.1 mg/dL (ref 1.7–2.4)

## 2018-09-26 MED ORDER — INSULIN ASPART PROT & ASPART (70-30 MIX) 100 UNIT/ML ~~LOC~~ SUSP
55.0000 [IU] | Freq: Two times a day (BID) | SUBCUTANEOUS | Status: DC
  Administered 2018-09-26 – 2018-09-27 (×3): 55 [IU] via SUBCUTANEOUS

## 2018-09-26 MED ORDER — POTASSIUM CHLORIDE 20 MEQ PO PACK
20.0000 meq | PACK | Freq: Once | ORAL | Status: AC
  Administered 2018-09-26: 20 meq via ORAL
  Filled 2018-09-26 (×2): qty 1

## 2018-09-26 MED ORDER — HYDRALAZINE HCL 25 MG PO TABS
12.5000 mg | ORAL_TABLET | Freq: Three times a day (TID) | ORAL | Status: DC
  Administered 2018-09-26 – 2018-09-28 (×7): 12.5 mg via ORAL
  Filled 2018-09-26 (×6): qty 1

## 2018-09-26 MED ORDER — METOLAZONE 2.5 MG PO TABS
2.5000 mg | ORAL_TABLET | Freq: Once | ORAL | Status: AC
  Administered 2018-09-26: 2.5 mg via ORAL
  Filled 2018-09-26: qty 1

## 2018-09-26 MED ORDER — SORBITOL 70 % SOLN
30.0000 mL | Freq: Once | Status: AC
  Administered 2018-09-26: 30 mL via ORAL
  Filled 2018-09-26: qty 30

## 2018-09-26 MED ORDER — DOCUSATE SODIUM 100 MG PO CAPS
100.0000 mg | ORAL_CAPSULE | Freq: Every day | ORAL | Status: DC
  Administered 2018-09-26 – 2018-09-28 (×3): 100 mg via ORAL
  Filled 2018-09-26 (×3): qty 1

## 2018-09-26 NOTE — Progress Notes (Signed)
CARDIAC REHAB PHASE I   PRE:  Rate/Rhythm: 64 SR    BP: sitting 133/48    SaO2: 84 RA lying/asleep, 92 RA EOB  MODE:  Ambulation: 270 ft   POST:  Rate/Rhythm: 76 SR    BP: sitting 180/61     SaO2: 87-88 RA after walk, up to 96 RA with rest  Pt asleep on arrival, SaO2 in 80s. Up with sitting up. Able to walk, fairly steady with light assist. Sts her SOB is much better. To recliner. Gave HF booklet and reviewed. She has been weighing in CRPII. Encouraged her to do at home.  [place], ACSM 09/26/2018 11:44 AM

## 2018-09-26 NOTE — Progress Notes (Signed)
Inpatient Diabetes Program Recommendations  AACE/ADA: New Consensus Statement on Inpatient Glycemic Control (2015)  Target Ranges:  Prepandial:   less than 140 mg/dL      Peak postprandial:   less than 180 mg/dL (1-2 hours)      Critically ill patients:  140 - 180 mg/dL   Lab Results  Component Value Date   GLUCAP 323 (H) 09/26/2018   HGBA1C 10.3 (H) 09/19/2018    Results for [name], [name] (MRN [id number]) as of 09/26/2018 14:54  Ref. Range 09/25/2018 13:11 09/25/2018 16:38 09/25/2018 21:30 09/26/2018 02:28 09/26/2018 07:26 09/26/2018 12:10  Glucose-Capillary Latest Ref Range: 70 - 99 mg/dL 201 (H) 328 (H) 311 (H) 197 (H) 217 (H) 323 (H)    DM 2  Outpatient Diabetes Medications: Novolin 70/30  65-20-65 units tid wc   Current orders for Inpatient glycemic control : Novolog 70/30 45 units BID                                                                           Novolog 15 units lunch                                                                           Novolog (0-15 units) tid ac and (0-5 units) hs   Blood glucose still running above goal.     Inpatient Diabetes Program Recommendations:  Please consider increasing Novolog 70/30 to 55 units BID.    Thank you.   -- Will follow during hospitalization.--  Jonna Clark RN, MSN Diabetes Coordinator Inpatient Glycemic Control Team Team Pager: [phone] (8am-5pm)

## 2018-09-26 NOTE — Progress Notes (Addendum)
Patient ID: [name], female   DOB: 20-Jun-1951, 68 y.o.   MRN: [id number]     Advanced Heart Failure Rounding Note  PCP-Cardiologist: Minus Breeding, MD   Subjective:    Had Hinds 2/11, which showed elevated right and left filling pressures. Preserved CO. Given 80 mg IV lasix x1 and started on lasix drip @12  mg/hr. Weight down 1 lb. I/O negative 580 mls.   Cr 1.9 -> 2.39 -> 2.43 -> 3 -> 2.77 -> 3.12 -> 2.83.   Denies SOB walking around room. Still orthopneic. No CP. Dizzy when she moves side to side, but not with standing. Takes PRN meclizine at home. Feels constipated. No good BM in 2 days. Says she has urinated in toilet several times while trying to have a BM (undocumented).  PYP scan: H/CL 1.8, grade 2 visual.   RHC Procedural Findings 09/25/18: Hemodynamics (mmHg) RA mean 13 RV 52/7 PA 50/21(33) PCWP 25 Cardiac Output (Fick) 6.16 Cardiac Index (Fick) 2.92 PVR 1.3 WU  Objective:   Weight Range: 99 kg Body mass index is 34.19 kg/m.   Vital Signs:   Temp:  [97.4 F (36.3 C)-98.5 F (36.9 C)] 97.9 F (36.6 C) (02/12 0613) Pulse Rate:  [0-106] 66 (02/12 0613) Resp:  [0-34] 18 (02/12 0613) BP: (124-162)/(42-84) 127/47 (02/12 0613) SpO2:  [0 %-97 %] 95 % (02/12 0613) Weight:  [99 kg] 99 kg (02/12 0611) Last BM Date: 09/25/18  Weight change: Filed Weights   09/24/18 0501 09/25/18 0132 09/26/18 0611  Weight: 99.3 kg 99.7 kg 99 kg   Intake/Output:   Intake/Output Summary (Last 24 hours) at 09/26/2018 0746 Last data filed at 09/26/2018 0655 Gross per 24 hour  Intake 794.1 ml  Output 1375 ml  Net -580.9 ml    Physical Exam    General:No resp difficulty. HEENT: Normal Neck: Thick. JVP difficult. Carotids 2+ bilat; no bruits. No thyromegaly or nodule noted. Cor: PMI nonpalpable. RRR, No M/G/R noted Lungs: CTAB, normal effort. Abdomen: Soft, non-tender, non-distended, no HSM. No bruits or masses. +BS  Extremities: No cyanosis, clubbing, or rash. R and LLE  trace to 1+ edema. RLE red. Neuro: Alert & orientedx3, cranial nerves grossly intact. moves all 4 extremities w/o difficulty. Affect pleasant  Telemetry   NSR 60s, personally reviewed.   EKG    No new tracings.   Labs    CBC Recent Labs    09/25/18 0502 09/25/18 1239 09/25/18 1240  WBC 8.2  --   --   HGB 10.8* 11.6* 11.6*  HCT 36.9 34.0* 34.0*  MCV 86.0  --   --   PLT 164  --   --    Basic Metabolic Panel Recent Labs    09/25/18 0502  09/25/18 1240 09/26/18 0417  NA 142   < > 142 142  K 4.3   < > 4.6 4.2  CL 98  --   --  96*  CO2 31  --   --  33*  GLUCOSE 248*  --   --  211*  BUN 67*  --   --  73*  CREATININE 3.12*  --   --  2.83*  CALCIUM 8.6*  --   --  8.5*  MG  --   --   --  2.1   < > = values in this interval not displayed.   Liver Function Tests No results for input(s): AST, ALT, ALKPHOS, BILITOT, PROT, ALBUMIN in the last 72 hours. No results for input(s): LIPASE, AMYLASE  in the last 72 hours. Cardiac Enzymes No results for input(s): CKTOTAL, CKMB, CKMBINDEX, TROPONINI in the last 72 hours.  BNP: BNP (last 3 results) Recent Labs    03/13/18 1712 05/04/18 0934 09/05/18 1048  BNP 163.3* 141.8* 116.6*   ProBNP (last 3 results) No results for input(s): PROBNP in the last 8760 hours.  D-Dimer No results for input(s): DDIMER in the last 72 hours. Hemoglobin A1C No results for input(s): HGBA1C in the last 72 hours. Fasting Lipid Panel No results for input(s): CHOL, HDL, LDLCALC, TRIG, CHOLHDL, LDLDIRECT in the last 72 hours. Thyroid Function Tests No results for input(s): TSH, T4TOTAL, T3FREE, THYROIDAB in the last 72 hours.  Invalid input(s): FREET3  Other results:  Imaging    No results found.   Medications:     Scheduled Medications: . apixaban  5 mg Oral BID  . calcium-vitamin D  1 tablet Oral Q M,W,F  . carvedilol  25 mg Oral BID  . doxycycline  100 mg Oral Q12H  . hydrALAZINE  25 mg Oral TID  . insulin aspart  0-15 Units  Subcutaneous TID WC  . insulin aspart  0-5 Units Subcutaneous QHS  . insulin aspart  15 Units Subcutaneous QAC lunch  . insulin aspart protamine- aspart  45 Units Subcutaneous BID WC  . Livalo 4 mg tablet  4 mg Oral QAC supper  . prenatal multivitamin  1 tablet Oral Once per day on Mon Wed Fri  . sodium chloride flush  3 mL Intravenous Q12H    Infusions: . sodium chloride    . furosemide (LASIX) infusion Stopped (09/25/18 1703)    PRN Medications: sodium chloride, acetaminophen, ALPRAZolam, magnesium hydroxide, meclizine, nitroGLYCERIN, ondansetron (ZOFRAN) IV, polyvinyl alcohol, sodium chloride, sodium chloride flush, zolpidem  Patient Profile   [name] is a 68 y.o. female with AS s/p TAVR, chronic diastolic CHF, PAF, CKD III, HLD, HTN, OSA, and morbid obesity  Admitted from [facility] office with marked volume overload.   Assessment/Plan   1. Acute on chronic diastolic CHF: Echo this admission with EF 60-65%, moderate LVH, grade 2 diastolic dysfunction, trivial mitral stenosis, mildly dilated RV with mildly decreased systolic function, TAVR valve looks ok, moderate pericardial effusion (no tamponade).  She has diuresed well and weight down > 20 lbs.  PYP scan suggestive of transthyretin amyloidosis, myeloma panel negative.   - Volume elevated on RHC 2/11 with preserved CO. Difficult to assess volume on exam. - Continue lasix drip @ 12 mg/hr. Creatinine improving with diuresis. Consider increasing. 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  Will need genetic testing as outpatient and will need to work on getting her tafamidis.   2. AKI on CKD stage 3: baseline creatinine around 2.  Creatinine 3 -> 2.7 -> 3.1 -> 2.8 - Improving with diuresis.  3. CAD: Cath in 8/19 with moderate RCA and LCx disease, severe distal LAD disease.  No chest pain.  - Continue pitivastatin.  4. Atrial fibrillation: Paroxysmal. Maintaining NSR.  - Continue Eliquis.  5. HTN: SBP stable  120s generally - Continue Coreg.  - Continue hydralazine 25 mg tid, will not push BP too hard with rise in creatinine.   6. Possible RLE cellulitis: Covering with doxycycline. No change.    7. LLQ cyst on abdominal US: Needs eventual pelvic CT to evaluate. No change. 8. Constipation - Add sorbitol and colace 9. Vertigo - She has PRN meclizine ordered.  Georgiana Shore, NP 09/26/2018 7:46 AM  Patient seen  with NP, agree with the above note.   Still orthopneic with borderline oxygen saturation.  BUN higher today but creatinine lower.   On exam, JVP remains elevated (difficult but appears in 12 range).  Clear lungs.  Decreased peripheral edema.   Continue Lasix gtt 12 mg/hr today and will give 1 dose metolazone 2.5 x 1.  Hopefully we can get 1 more day of effective diuresis, then likely transition to po torsemide.   Decrease hydralazine to 12.5 mg tid to make sure BP does not drop too low.   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   [name] 09/26/2018 8:57 AM

## 2018-09-27 LAB — BASIC METABOLIC PANEL
Anion gap: 15 (ref 5–15)
BUN: 73 mg/dL — ABNORMAL HIGH (ref 8–23)
CO2: 34 mmol/L — ABNORMAL HIGH (ref 22–32)
Calcium: 9 mg/dL (ref 8.9–10.3)
Chloride: 93 mmol/L — ABNORMAL LOW (ref 98–111)
Creatinine, Ser: 2.77 mg/dL — ABNORMAL HIGH (ref 0.44–1.00)
GFR calc Af Amer: 20 mL/min — ABNORMAL LOW (ref >60)
GFR calc non Af Amer: 17 mL/min — ABNORMAL LOW (ref >60)
Glucose, Bld: 138 mg/dL — ABNORMAL HIGH (ref 70–99)
Potassium: 3.8 mmol/L (ref 3.5–5.1)
Sodium: 142 mmol/L (ref 135–145)

## 2018-09-27 LAB — GLUCOSE, CAPILLARY
Glucose-Capillary: 171 mg/dL — ABNORMAL HIGH (ref 70–99)
Glucose-Capillary: 300 mg/dL — ABNORMAL HIGH (ref 70–99)
Glucose-Capillary: 261 mg/dL — ABNORMAL HIGH (ref 70–99)
Glucose-Capillary: 209 mg/dL — ABNORMAL HIGH (ref 70–99)

## 2018-09-27 MED ORDER — TORSEMIDE 20 MG PO TABS
40.0000 mg | ORAL_TABLET | Freq: Every day | ORAL | Status: DC
  Administered 2018-09-27 – 2018-09-28 (×2): 40 mg via ORAL
  Filled 2018-09-27 (×3): qty 2

## 2018-09-27 NOTE — Progress Notes (Addendum)
Patient ID: [name], female   DOB: 1951/02/09, 68 y.o.   MRN: [id number]     Advanced Heart Failure Rounding Note  PCP-Cardiologist: Minus Breeding, MD   Subjective:    Had [place] 2/11, which showed elevated right and left filling pressures. Preserved CO.   Remains on lasix drip @ 12 mg/hr. Received metolazone yesterday. Great diuresis. Negative 2.4 L. Weight down 7 lbs overnight (33 lbs total). SBP 140-150s  Cr 1.9 -> 2.39 -> 2.43 -> 3 -> 2.77 -> 3.12 -> 2.83 -> 2.77. BUN 73  Denies SOB while ambulating. Had a BM yesterday. No CP or orthopnea. On RA with good O2 sats.  PYP scan: H/CL 1.8, grade 2 visual.   RHC Procedural Findings 09/25/18: Hemodynamics (mmHg) RA mean 13 RV 52/7 PA 50/21(33) PCWP 25 Cardiac Output (Fick) 6.16 Cardiac Index (Fick) 2.92 PVR 1.3 WU  Objective:   Weight Range: 95.8 kg Body mass index is 33.09 kg/m.   Vital Signs:   Temp:  [97.6 F (36.4 C)-98.2 F (36.8 C)] 97.7 F (36.5 C) (02/13 0601) Pulse Rate:  [54-70] 64 (02/13 0601) Resp:  [18-20] 18 (02/13 0601) BP: (141-152)/(46-58) 147/58 (02/13 0601) SpO2:  [93 %-97 %] 97 % (02/13 0601) Weight:  [95.8 kg] 95.8 kg (02/13 0601) Last BM Date: 09/26/18  Weight change: Filed Weights   09/25/18 0132 09/26/18 0611 09/27/18 0601  Weight: 99.7 kg 99 kg 95.8 kg   Intake/Output:   Intake/Output Summary (Last 24 hours) at 09/27/2018 0817 Last data filed at 09/27/2018 0603 Gross per 24 hour  Intake 1464 ml  Output 3902 ml  Net -2438 ml    Physical Exam    General: No resp difficulty. HEENT: Normal Neck: Thick. JVP difficult. Carotids 2+ bilat; no bruits. No thyromegaly or nodule noted. Cor: PMI nonpalpable. RRR, No M/G/R noted Lungs: Clear, distant. Abdomen: Soft, non-tender, non-distended, no HSM. No bruits or masses. +BS  Extremities: No cyanosis, clubbing, or rash. R and LLE no edema. RLE red. Neuro: Alert & orientedx3, cranial nerves grossly intact. moves all 4 extremities w/o  difficulty. Affect pleasant  Telemetry   NSR 60s, personally reviewed.   EKG    No new tracings.   Labs    CBC Recent Labs    09/25/18 0502 09/25/18 1239 09/25/18 1240  WBC 8.2  --   --   HGB 10.8* 11.6* 11.6*  HCT 36.9 34.0* 34.0*  MCV 86.0  --   --   PLT 164  --   --    Basic Metabolic Panel Recent Labs    09/26/18 0417 09/27/18 0526  NA 142 142  K 4.2 3.8  CL 96* 93*  CO2 33* 34*  GLUCOSE 211* 138*  BUN 73* 73*  CREATININE 2.83* 2.77*  CALCIUM 8.5* 9.0  MG 2.1  --    Liver Function Tests No results for input(s): AST, ALT, ALKPHOS, BILITOT, PROT, ALBUMIN in the last 72 hours. No results for input(s): LIPASE, AMYLASE in the last 72 hours. Cardiac Enzymes No results for input(s): CKTOTAL, CKMB, CKMBINDEX, TROPONINI in the last 72 hours.  BNP: BNP (last 3 results) Recent Labs    03/13/18 1712 05/04/18 0934 09/05/18 1048  BNP 163.3* 141.8* 116.6*   ProBNP (last 3 results) No results for input(s): PROBNP in the last 8760 hours.  D-Dimer No results for input(s): DDIMER in the last 72 hours. Hemoglobin A1C No results for input(s): HGBA1C in the last 72 hours. Fasting Lipid Panel No results for input(s): CHOL,  HDL, LDLCALC, TRIG, CHOLHDL, LDLDIRECT in the last 72 hours. Thyroid Function Tests No results for input(s): TSH, T4TOTAL, T3FREE, THYROIDAB in the last 72 hours.  Invalid input(s): FREET3  Other results:  Imaging    No results found.   Medications:     Scheduled Medications: . apixaban  5 mg Oral BID  . calcium-vitamin D  1 tablet Oral Q M,W,F  . carvedilol  25 mg Oral BID  . docusate sodium  100 mg Oral Daily  . doxycycline  100 mg Oral Q12H  . hydrALAZINE  12.5 mg Oral TID  . insulin aspart  0-15 Units Subcutaneous TID WC  . insulin aspart  0-5 Units Subcutaneous QHS  . insulin aspart  15 Units Subcutaneous QAC lunch  . insulin aspart protamine- aspart  55 Units Subcutaneous BID WC  . Livalo 4 mg tablet  4 mg Oral QAC supper   . prenatal multivitamin  1 tablet Oral Once per day on Mon Wed Fri  . sodium chloride flush  3 mL Intravenous Q12H    Infusions: . sodium chloride    . furosemide (LASIX) infusion 12 mg/hr (09/27/18 0300)    PRN Medications: sodium chloride, acetaminophen, ALPRAZolam, meclizine, nitroGLYCERIN, ondansetron (ZOFRAN) IV, polyvinyl alcohol, sodium chloride, sodium chloride flush, zolpidem  Patient Profile   Dalynn P Sane is a 68 y.o. female with AS s/p TAVR, chronic diastolic CHF, PAF, CKD III, HLD, HTN, OSA, and morbid obesity  Admitted from [facility] office with marked volume overload.   Assessment/Plan   1. Acute on chronic diastolic CHF: Echo this admission with EF 60-65%, moderate LVH, grade 2 diastolic dysfunction, trivial mitral stenosis, mildly dilated RV with mildly decreased systolic function, TAVR valve looks ok, moderate pericardial effusion (no tamponade).  She has diuresed well and weight down > 20 lbs.  PYP scan suggestive of transthyretin amyloidosis, myeloma panel negative.   - Volume elevated on RHC 2/11 with preserved CO. Difficult to assess volume on exam, but great diuresis yesterday with lasix drip and metolazone.  - Continue lasix drip @ 12 mg/hr. Creatinine stable 2.77. Continue this am. Likely switch to torsemide this afternoon. She was taking lasix 20 mg BID PTA. 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  Will need genetic testing as outpatient and will need to work on getting her tafamidis.   2. AKI on CKD stage 3: baseline creatinine around 2.  Creatinine 3 -> 2.7 -> 3.1 -> 2.8 -> 2.77 - Improving with diuresis.  3. CAD: Cath in 8/19 with moderate RCA and LCx disease, severe distal LAD disease. No chest pain. - Continue pitivastatin.  4. Atrial fibrillation: Paroxysmal. Maintaining NSR - Continue Eliquis.  5. HTN: SBP 140-150s - Continue Coreg 25 mg BID.  - Continue hydralazine 12.5 mg tid, will not push BP too hard with rise in creatinine.  Decreased yesterday, but SBP 140-150s. Can likely increase back to 25 mg once off lasix drip.  6. Possible RLE cellulitis: Covering with doxycycline. No change.  7. LLQ cyst on abdominal US: Needs eventual pelvic CT to evaluate. No change.  8. Constipation - Resolved.  9. Vertigo - She has PRN meclizine ordered. No change.   Georgiana Shore, NP 09/27/2018 8:17 AM   Patient seen with NP, agree with the above note.    Weight now down about 33 lbs.  Breathing is better.  JVP 8 cm.  Creatinine stable at 2.77.   Today I will stop Lasix gtt and start torsemide 40 mg  daily this afternoon.  Possible home tomorrow if creatinine remains stable.   She will need close followup after discharge.  Would like to try to get her on tafamidis for transthyretin amyloidosis.   [name] 09/27/2018 8:48 AM

## 2018-09-27 NOTE — Care Management Note (Signed)
Case Management Note  Patient Details  Name: [name] MRN: [id number] Date of Birth: [birth year]  Subjective/Objective:   CHF                Action/Plan: Patient lives at home with her spouse of 33 yrs; PCP is Dr Morrie Sheldon with [facility]; has private insurance with Medicare; pharmacy of choice is [facility] in [place]; DME - walker, cane and bedside commode; she has scales at home and knows to weigh herself daily; she endorse a low sodium diet; Patient could benefit from a [facility] [place] choice offered, pt chose [facility]; Dan with [facility] called for arrangements; Attending MD at discharge please enter the face to face for [facility] services.  Expected Discharge Date:   possibly 09/28/2018               Expected Discharge Plan:  [place]  In-House Referral:   [facility]  Discharge planning Services  CM Consult  Choice offered to:  Patient  HH Arranged:  RN, Disease Management [place] Agency:  [place]  Status of Service:  In process, will continue to follow  Sherrilyn Rist [phone] 09/27/2018, 3:57 PM

## 2018-09-27 NOTE — Progress Notes (Signed)
Inpatient Diabetes Program Recommendations  AACE/ADA: New Consensus Statement on Inpatient Glycemic Control (2015)  Target Ranges:  Prepandial:   less than 140 mg/dL      Peak postprandial:   less than 180 mg/dL (1-2 hours)      Critically ill patients:  140 - 180 mg/dL   Lab Results  Component Value Date   GLUCAP 300 (H) 09/27/2018   HGBA1C 10.3 (H) 09/19/2018    Review of Glycemic Control Results for GELSEY, AMYX (MRN [id number]) as of 09/27/2018 14:28  Ref. Range 09/27/2018 07:50 09/27/2018 11:57  Glucose-Capillary Latest Ref Range: 70 - 99 mg/dL 171 (H) 300 (H)  DM 2  Outpatient Diabetes Medications: Novolin 70/30  65-20-65 units tid wc   Current orders for Inpatient glycemic control : Novolog 70/30 55 units BID                                                                           Novolog 15 units lunch                                                                           Novolog (0-15 units) tid ac and (0-5 units) hs  Inpatient Diabetes Program Recommendations:    Please consider increasing Novolog 70/30 to 60 units bid.   Thanks,  Adah Perl, RN, BC-ADM Inpatient Diabetes Coordinator Pager [phone] (8a-5p)

## 2018-09-27 NOTE — Evaluation (Signed)
Physical Therapy Evaluation & Discharge Patient Details Name: [name] MRN: [id number] DOB: 1950-10-29 Today's Date: 09/27/2018   History of Present Illness  Pt is a 68 y.o. female admitted 09/19/18 with increased swelling and DOE; worked up for volume overload secondary to acute on chronic CHF. S/p cardiac cath 2/11. PMH Includes s/p TAVR, CHF, HTN, afib, CKD3, vertigo.     Clinical Impression  Patient evaluated by Physical Therapy with no further acute PT needs identified. PTA, pt indep and lives with husband. Today, pt ambulating at supervision-level. DOE improved; SpO2 >88% on RA while walking. All education has been completed and the patient has no further questions. Discussed recommendation for pt to continue with outpatient cardiac rehab. Acute PT is signing off. Thank you for this referral.    Follow Up Recommendations Supervision for mobility/OOB(continue with cardiac rehab II)    Equipment Recommendations  None recommended by PT    Recommendations for Other Services       Precautions / Restrictions Precautions Precautions: Fall Restrictions Weight Bearing Restrictions: No      Mobility  Bed Mobility Overal bed mobility: Independent                Transfers Overall transfer level: Independent Equipment used: None                Ambulation/Gait Ambulation/Gait assistance: Supervision Gait Distance (Feet): 300 Feet Assistive device: None Gait Pattern/deviations: Step-through pattern;Decreased stride length Gait velocity: Decreased Gait velocity interpretation: 1.31 - 2.62 ft/sec, indicative of limited community ambulator General Gait Details: Slow, mostly steady gait without DME, intermittent self-corrected instability, therefore supervision for safety. DOE 2/4, SpO2 >88% on RA  Stairs            Wheelchair Mobility    Modified Rankin (Stroke Patients Only)       Balance Overall balance assessment: Mild deficits observed, not  formally tested                                           Pertinent Vitals/Pain Pain Assessment: No/denies pain    Home Living Family/patient expects to be discharged to:: Private residence Living Arrangements: Spouse/significant other Available Help at Discharge: Family;Available 24 hours/day Type of Home: House Home Access: Stairs to enter   [employer] of Steps: 3 Home Layout: Multi-level;Laundry or work area in basement;Able to live on main level with bedroom/bathroom Home Equipment: Environmental consultant - 2 wheels;Bedside commode Additional Comments: Three daughters (two respiratory therapists, one RN)    Prior Function Level of Independence: Independent         Comments: Indep with mobility; will typically hold onto husband for balance with uneven terrain. 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, but hasn't needed recently     Hand Dominance        Extremity/Trunk Assessment   Upper Extremity Assessment Upper Extremity Assessment: Overall WFL for tasks assessed    Lower Extremity Assessment Lower Extremity Assessment: Overall WFL for tasks assessed    Cervical / Trunk Assessment Cervical / Trunk Assessment: Normal  Communication   Communication: HOH  Cognition Arousal/Alertness: Awake/alert Behavior During Therapy: WFL for tasks assessed/performed Overall Cognitive Status: Within Functional Limits for tasks assessed  General Comments General comments (skin integrity, edema, etc.): Husband Teacher, early years/pre) present    Exercises     Assessment/Plan    PT Assessment Patent does not need any further PT services  PT Problem List         PT Treatment Interventions      PT Goals (Current goals can be found in the Care Plan section)  Acute Rehab PT Goals PT Goal Formulation: All assessment and education complete, DC therapy    Frequency     Barriers to discharge        Co-evaluation                AM-PAC PT "6 Clicks" Mobility  Outcome Measure Help needed turning from your back to your side while in a flat bed without using bedrails?: None Help needed moving from lying on your back to sitting on the side of a flat bed without using bedrails?: None Help needed moving to and from a bed to a chair (including a wheelchair)?: None Help needed standing up from a chair using your arms (e.g., wheelchair or bedside chair)?: None Help needed to walk in hospital room?: A Little Help needed climbing 3-5 steps with a railing? : A Little 6 Click Score: 22    End of Session Equipment Utilized During Treatment: Gait belt Activity Tolerance: Patient tolerated treatment well Patient left: in bed;with call bell/phone within reach;with family/visitor present Nurse Communication: Mobility status PT Visit Diagnosis: Other abnormalities of gait and mobility (R26.89)    Time: 4287-6811 PT Time Calculation (min) (ACUTE ONLY): 13 min   Charges:   PT Evaluation $PT Eval Moderate Complexity: Iva, PT, DPT Acute Rehabilitation Services  Pager [phone] Office [place] 09/27/2018, 10:08 AM

## 2018-09-27 NOTE — Progress Notes (Signed)
CARDIAC REHAB PHASE I   PRE:  Rate/Rhythm: 59 SB    BP: sitting 130/47    SaO2: 92 RA  MODE:  Ambulation: 370 ft   POST:  Rate/Rhythm: 75 SR    BP: sitting 148/67     SaO2: 98 RA  Tolerated well, independent. Able to increase distance, c/o back hurting with distance. Reviewed ed. Wants to go back to [facility], encouraged her to discuss with [facility] program where she is at. [place], ACSM 09/27/2018 2:46 PM

## 2018-09-28 LAB — BASIC METABOLIC PANEL
Anion gap: 13 (ref 5–15)
BUN: 85 mg/dL — ABNORMAL HIGH (ref 8–23)
CO2: 34 mmol/L — ABNORMAL HIGH (ref 22–32)
Calcium: 8.8 mg/dL — ABNORMAL LOW (ref 8.9–10.3)
Chloride: 93 mmol/L — ABNORMAL LOW (ref 98–111)
Creatinine, Ser: 3.21 mg/dL — ABNORMAL HIGH (ref 0.44–1.00)
GFR calc Af Amer: 16 mL/min — ABNORMAL LOW (ref >60)
GFR calc non Af Amer: 14 mL/min — ABNORMAL LOW (ref >60)
Glucose, Bld: 245 mg/dL — ABNORMAL HIGH (ref 70–99)
Potassium: 3.5 mmol/L (ref 3.5–5.1)
Sodium: 140 mmol/L (ref 135–145)

## 2018-09-28 LAB — GLUCOSE, CAPILLARY
Glucose-Capillary: 200 mg/dL — ABNORMAL HIGH (ref 70–99)
Glucose-Capillary: 261 mg/dL — ABNORMAL HIGH (ref 70–99)

## 2018-09-28 MED ORDER — INSULIN ASPART PROT & ASPART (70-30 MIX) 100 UNIT/ML ~~LOC~~ SUSP
60.0000 [IU] | Freq: Two times a day (BID) | SUBCUTANEOUS | Status: DC
  Administered 2018-09-28: 60 [IU] via SUBCUTANEOUS

## 2018-09-28 MED ORDER — DOXYCYCLINE HYCLATE 100 MG PO TABS
100.0000 mg | ORAL_TABLET | Freq: Two times a day (BID) | ORAL | Status: DC
  Administered 2018-09-28: 100 mg via ORAL
  Filled 2018-09-28: qty 1

## 2018-09-28 MED ORDER — DOXYCYCLINE HYCLATE 100 MG PO TABS: 100.0000 mg | ORAL_TABLET | Freq: Two times a day (BID) | ORAL | 0 refills | Status: DC

## 2018-09-28 MED ORDER — TORSEMIDE 20 MG PO TABS: 40.0000 mg | ORAL_TABLET | Freq: Every day | ORAL | 5 refills | Status: DC

## 2018-09-28 MED ORDER — HYDRALAZINE HCL 25 MG PO TABS: 12.5000 mg | ORAL_TABLET | Freq: Three times a day (TID) | ORAL | 5 refills | Status: DC

## 2018-09-28 NOTE — Progress Notes (Signed)
CARDIAC REHAB PHASE I   Pt and husband educated using teach back method on daily weights, daily zone method, sodium restrictions, and signs and symptoms including shortness of breath and edema. All questions answered.  [place], BSN 09/28/2018 12:00 PM

## 2018-09-28 NOTE — Discharge Summary (Signed)
Advanced Heart Failure Discharge Note  Discharge Summary   Patient ID: [name] MRN: [id number], DOB/AGE: 68-03-1951 68 y.o. Admit date: 09/19/2018 D/C date:     09/28/2018   Primary Discharge Diagnoses:  1. A/C diastolic CHF 2. AKI on CKD3 3. CAD 4. PAF 5. HTN 6. RLE cellulitis 7. LLQ cyst 8. Constipation 9. Vertigo  Hospital Course:  [name] Lawsonis a 68 y.o.femalewith AS s/p TAVR, chronic diastolic CHF, PAF, CKD III, HLD, HTN, OSA, and morbid obesity.    She was admitted from [facility] office with marked volume overload. She diuresed 36 lbs with lasix drip and was transitioned to torsemide 40 mg daily. Course complicated by AKI. RHC completed (see below). PYP scan suggestive of TTR amyloid. Will plan to send genetic testing and work on getting tafamidis for her as outpatient. She was also treated for cellulitis of RLE.   1. Acute on chronic diastolic CHF: Echo this admission with EF 60-65%, moderate LVH, grade 2 diastolic dysfunction, trivial mitral stenosis, mildly dilated RV with mildly decreased systolic function, TAVR valve looks ok, moderate pericardial effusion (no tamponade).  Diuresed 36 lbs with lasix drip. PYP scan suggestive of transthyretin amyloidosis, myeloma panel negative.   - With AKI on day of DC, she was instructed to hold torsemide until Tuesday. We will check BMET on Monday and if creatinine improved, will have her resume torsemide 40 mg daily.  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  Will need genetic testing as outpatient and will need to work on getting her tafamidis.  2. AKI on CKD stage 3: baseline creatinine around 2.  Creatinine 3 -> 2.7 -> 3.1 -> 2.8 -> 2.77 -> 3.22 - She was instructed to hold torsemide until Tuesday. We will check BMET on Monday and contact her with instructions.  3. CAD: Cath in 8/19 with moderate RCA and LCx disease, severe distal LAD disease. No chest pain.  - Continue pitivastatin.  4. Atrial  fibrillation: Paroxysmal. Maintaining NSR.  - Continue Eliquis.  5. HTN:  - Continue Coreg 25 mg BID.  - Continue hydralazine 12.5 mg tid 6. Possible RLE cellulitis: Covering with 10 days of doxycycline.  7. LLQ cyst on abdominal US: Needs eventual pelvic CT to evaluate. 8. Constipation - Resolved.  9. Vertigo - Takes PRN meclizine.   She will be followed closely in HF clinic, with appointment as below. She will be followed by [place] [facility].   Discharge Weight Range: 208 lbs Discharge Vitals: Blood pressure (!) 140/50, pulse 63, temperature 98.3 F (36.8 C), temperature source Oral, resp. rate 18, height 5\' 7"  (1.702 m), weight 94.6 kg, SpO2 95 %.  Labs: Lab Results  Component Value Date   WBC 8.2 09/25/2018   HGB 11.6 (L) 09/25/2018   HCT 34.0 (L) 09/25/2018   MCV 86.0 09/25/2018   PLT 164 09/25/2018    Recent Labs  Lab 09/28/18 0300  NA 140  K 3.5  CL 93*  CO2 34*  BUN 85*  CREATININE 3.21*  CALCIUM 8.8*  GLUCOSE 245*   Lab Results  Component Value Date   CHOL 192 06/16/2018   HDL 45 06/16/2018   LDLCALC 115 (H) 06/16/2018   TRIG 159 (H) 06/16/2018   BNP (last 3 results) Recent Labs    03/13/18 1712 05/04/18 0934 09/05/18 1048  BNP 163.3* 141.8* 116.6*    ProBNP (last 3 results) No results for input(s): PROBNP in the last 8760 hours.   Diagnostic Studies/Procedures  PYP 09/24/18: Visual and quantitative assessment (grade 2, H/CL equal 1.8) are suggestive of transthyretin amyloidosis.  R/LHC 09/25/18: 1. Elevated right and left heart filling pressures.  2. Pulmonary venous hypertension.  3. Preserved cardiac output.   RHC Procedural Findings: Hemodynamics (mmHg) RA mean 13 RV 52/14 PA 50/21, mean 33 PCWP mean 25  Oxygen saturations: PA 64% AO 95%  Cardiac Output (Fick) 6.16  Cardiac Index (Fick) 2.92 PVR 1.3 WU  Echo 09/20/18:  Left Ventricle: The left ventricle has normal systolic function of 80-99%. The cavity size  was normal. There is moderately increased left ventricular wall thickness. Echo evidence of pseudonormalization in diastolic relaxation No evidence of left  ventricular regional wall motion abnormalities.. Right Ventricle: The right ventricle has mildly reduced systolic function. The cavity was mildly enlarged. There is no increase in right ventricular wall thickness. The right ventricle was not well visualized. Left Atrium: left atrial size was normal in size Right Atrium: right atrial size was mildly dilated Interatrial Septum: The interatrial septum was not well visualized.  Pericardium: A moderately sized pericardial effusion is present. The pericardial effusion is posterior to the left ventricle. Mitral Valve: The mitral valve is degenerative in appearance. There is moderate calcification. There is moderate to severe mitral annular calcification present. Mitral valve regurgitation is not visualized by color flow Doppler. Tricuspid Valve: The tricuspid valve is normal in structure. Tricuspid valve regurgitation was not visualized by color flow Doppler. Aortic Valve: Bioprosthetic aortic valve s/p TAVR. Aortic valve regurgitation was not visualized by color flow Doppler. Pulmonic Valve: The pulmonic valve was grossly normal. Pulmonic valve regurgitation is trivial by color flow Doppler. Aorta: The aortic root is normal in size and structure. Venous: The inferior vena cava is dilated in size with less than 50% respiratory variability.  Discharge Medications   Allergies as of 09/28/2018      Reactions   Amlodipine Swelling   Ankle swelling   Atorvastatin Other (See Comments)   Myalgias   Crestor [rosuvastatin Calcium] Other (See Comments)   Stiffness and back pain   Ezetimibe-simvastatin Other (See Comments)   Leg cramps   Statins Other (See Comments)   Myalgias   Celebrex [celecoxib] Rash   Levemir [insulin Detemir] Rash   Penicillins Other (See Comments)   SYNCOPE (Tolerated  Ancef) Has patient had a PCN reaction causing immediate rash, facial/tongue/throat swelling, SOB or lightheadedness with hypotension: Yes Has patient had a PCN reaction causing severe rash involving mucus membranes or skin necrosis: No Has patient had a PCN reaction that required hospitalization No Has patient had a PCN reaction occurring within the last 10 years: No If all of the above answers are "NO", then may proceed with Cephalosporin use.      Medication List    STOP taking these medications   furosemide 20 MG tablet Commonly known as:  LASIX   losartan 50 MG tablet Commonly known as:  COZAAR     TAKE these medications   ACCU-CHEK AVIVA PLUS test strip Generic drug:  glucose blood USE TO CHECK BLOOD SUGAR UP TO TWICE DAILY OR AS INSTRUCTED   apixaban 5 MG Tabs tablet Commonly known as:  ELIQUIS Take 1 tablet (5 mg total) by mouth 2 (two) times daily.   B-D INS SYR ULTRAFINE 1CC/31G 31G X 5/16" 1 ML Misc Generic drug:  Insulin Syringe-Needle U-100 USE TO INJECT INSULIN TWICE A DAY AS INSTRUCTED What changed:  See the new instructions.   Calcium Carbonate-Vitamin D 600-400 MG-UNIT tablet Commonly  known as:  CALCIUM 600+D Take 1 tablet by mouth 2 (two) times daily. What changed:  when to take this   carvedilol 25 MG tablet Commonly known as:  COREG Take 1 tablet (25 mg total) by mouth 2 (two) times daily.   clindamycin 300 MG capsule Commonly known as:  CLEOCIN Take 2 tablets (600 mg) one hour prior to dental visits.   doxycycline 100 MG tablet Commonly known as:  VIBRA-TABS Take 1 tablet (100 mg total) by mouth every 12 (twelve) hours.   hydrALAZINE 25 MG tablet Commonly known as:  APRESOLINE Take 0.5 tablets (12.5 mg total) by mouth 3 (three) times daily. What changed:    medication strength  how much to take   Insulin Pen Needle 32G X 4 MM Misc Use to inject insulin with insulin pen   LIVALO 4 MG Tabs Generic drug:  Pitavastatin Calcium TAKE 1  TABLET BY MOUTH ONCE DAILY   meclizine 12.5 MG tablet Commonly known as:  ANTIVERT Take 12.5 mg by mouth 3 (three) times daily as needed for dizziness.   mupirocin ointment 2 % Commonly known as:  BACTROBAN Apply 1 application topically 2 (two) times daily. What changed:    when to take this  reasons to take this   NOVOLIN 70/30 RELION (70-30) 100 UNIT/ML injection Generic drug:  insulin NPH-regular Human Inject 15-65 Units into the skin See admin instructions. Taking 65 units with breakfast and 20 units at lunch and 65 units with dinner.   ondansetron 4 MG disintegrating tablet Commonly known as:  ZOFRAN ODT Take 1 tablet (4 mg total) by mouth every 8 (eight) hours as needed for nausea.   prenatal multivitamin Tabs tablet Take 1 tablet by mouth 3 (three) times a week.   sodium chloride 0.65 % Soln nasal spray Commonly known as:  OCEAN Place 1 spray into both nostrils as needed for congestion.   SYSTANE COMPLETE 0.6 % Soln Generic drug:  Propylene Glycol Apply 1 drop to eye daily as needed (dry eyes).   torsemide 20 MG tablet Commonly known as:  DEMADEX Take 2 tablets (40 mg total) by mouth daily. Please wait to hear back about labs on Monday. Start taking on:  October 02, 2018   Vitamin D3 50 MCG (2000 UT) Tabs Take 2,000 Units by mouth daily.   vitamin E 400 UNIT capsule Take 400 Units by mouth 3 (three) times a week.            Durable Medical Equipment  (From admission, onward)         Start     Ordered   09/28/18 1012  Heart failure home health orders  (Heart failure home health orders / Face to face)  Once    Comments:  Heart Failure Follow-up Care:  Verify follow-up appointments per Patient Discharge Instructions. Confirm transportation arranged. Reconcile home medications with discharge medication list. Remove discontinued medications from use. Assist patient/caregiver to manage medications using pill box. Reinforce low sodium food  selection Assessments: Vital signs and oxygen saturation at each visit. Assess home environment for safety concerns, caregiver support and availability of low-sodium foods. Consult Education officer, museum, PT/OT, Dietitian, and CNA based on assessments. Perform comprehensive cardiopulmonary assessment. Notify MD for any change in condition or weight gain of 3 pounds in one day or 5 pounds in one week with symptoms. Daily Weights and Symptom Monitoring: Ensure patient has access to scales. Teach patient/caregiver to weigh daily before breakfast and after voiding using same scale and  record.    Teach patient/caregiver to track weight and symptoms and when to notify Provider. Activity: Develop individualized activity plan with patient/caregiver.  HHRN 2 wk 2 for CP assessment  Question Answer Comment  Heart Failure Follow-up Care Advanced Heart Failure (AHF) Clinic at [phone]   Obtain the following labs Other see comments   Lab frequency Other see comments   Fax lab results to AHF Clinic at [phone]   Diet Low Sodium Heart Healthy   Fluid restrictions: 2000 mL Fluid      09/28/18 1012          Disposition   The patient will be discharged in stable condition to home. Discharge Instructions    (HEART FAILURE PATIENTS) Call MD:  Anytime you have any of the following symptoms: 1) 3 pound weight gain in 24 hours or 5 pounds in 1 week 2) shortness of breath, with or without a dry hacking cough 3) swelling in the hands, feet or stomach 4) if you have to sleep on extra pillows at night in order to breathe.   Complete by:  As directed    Diet - low sodium heart healthy   Complete by:  As directed    Heart Failure patients record your daily weight using the same scale at the same time of day   Complete by:  As directed    Increase activity slowly   Complete by:  As directed      Follow-up Information    [facility] Go on 10/08/2018.    Specialty:  Cardiology Why:  10/01/18 at 11:45 AM for LABS only. Hold torsemide until we call you with results. Hosp follow up appointment 10/08/18 at 11 am in the [facility]. Gate code is [phone] for February.  Please bring all medications to appt. Contact information: [address] [phone] [place] Follow up.   Why:  A home health care nurse will go to your home Contact information: [address] [place] [address] [phone]             Duration of Discharge Encounter: Greater than 35 minutes   Signed, Georgiana Shore, NP 09/28/2018, 10:15 AM

## 2018-09-28 NOTE — Care Management Important Message (Signed)
Important Message  Patient Details  Name: [name] MRN: [id number] Date of Birth: 03/10/1951   Medicare Important Message Given:  Yes    [name] P [place] 09/28/2018, 11:48 AM

## 2018-09-28 NOTE — Progress Notes (Signed)
Patient discharged home. Discharge instructions and medication education given with teach back. Peripheral IV removed, clean dry and intact, pressure and dressing applied. Questions and concerns were answered. Medication picked up and signed from pharmacy brought from home and given to patient.  No questions and concerns at this time. Spouse at bedside. CCMD called.

## 2018-09-28 NOTE — Consult Note (Signed)
   [facility] CM Inpatient Consult   09/28/2018  [name] 04-04-1951 [id number]  Patient discussed in progression meeting and states not follow up needed will follow up at HF clinic and inpatient RNCM has assigned patient to [facility] HF calls.   [name], RN BSN [facility] Liaison  [phone] business mobile phone Toll free office [phone]

## 2018-09-28 NOTE — Progress Notes (Addendum)
Patient ID: [name], female   DOB: [birth year], 68 y.o.   MRN: [id number]     Advanced Heart Failure Rounding Note  PCP-Cardiologist: Minus Breeding, MD   Subjective:    Had [place] 2/11, which showed elevated right and left filling pressures. Preserved CO.   Lasix drip stopped yesterday and started on torsemide 40 mg daily. Creatinine 2.77 -> 3.21. I/O negative 1.8 L. Weight down 3 more lbs (36 total). SBP 140-150s.   Denies SOB or dizziness. Wants to go home.   PYP scan: H/CL 1.8, grade 2 visual.   RHC Procedural Findings 09/25/18: Hemodynamics (mmHg) RA mean 13 RV 52/7 PA 50/21(33) PCWP 25 Cardiac Output (Fick) 6.16 Cardiac Index (Fick) 2.92 PVR 1.3 WU  Objective:   Weight Range: 94.6 kg Body mass index is 32.66 kg/m.   Vital Signs:   Temp:  [98.3 F (36.8 C)-98.6 F (37 C)] 98.3 F (36.8 C) (02/14 0538) Pulse Rate:  [57-70] 63 (02/14 0834) Resp:  [18] 18 (02/14 0538) BP: (120-150)/(42-65) 140/50 (02/14 0834) SpO2:  [94 %-95 %] 95 % (02/14 0834) Weight:  [94.6 kg] 94.6 kg (02/14 0534) Last BM Date: 09/27/18  Weight change: Filed Weights   09/26/18 0611 09/27/18 0601 09/28/18 0534  Weight: 99 kg 95.8 kg 94.6 kg   Intake/Output:   Intake/Output Summary (Last 24 hours) at 09/28/2018 0838 Last data filed at 09/28/2018 0737 Gross per 24 hour  Intake 970 ml  Output 3025 ml  Net -2055 ml    Physical Exam    General: No resp difficulty. HEENT: Normal Neck: Supple. JVP difficult, but does not appear elevated. Carotids 2+ bilat; no bruits. No thyromegaly or nodule noted. Cor: PMI nondisplaced. RRR, No M/G/R noted Lungs: CTAB, normal effort. Abdomen: Soft, non-tender, non-distended, no HSM. No bruits or masses. +BS  Extremities: No cyanosis, clubbing, or rash. R and LLE no edema. RLE red.  Neuro: Alert & orientedx3, cranial nerves grossly intact. moves all 4 extremities w/o difficulty. Affect pleasant  Telemetry   NSR 60s. personally reviewed.   EKG      No new tracings.   Labs    CBC Recent Labs    09/25/18 1239 09/25/18 1240  HGB 11.6* 11.6*  HCT 34.0* 33.2*   Basic Metabolic Panel Recent Labs    09/26/18 0417 09/27/18 0526 09/28/18 0300  NA 142 142 140  K 4.2 3.8 3.5  CL 96* 93* 93*  CO2 33* 34* 34*  GLUCOSE 211* 138* 245*  BUN 73* 73* 85*  CREATININE 2.83* 2.77* 3.21*  CALCIUM 8.5* 9.0 8.8*  MG 2.1  --   --    Liver Function Tests No results for input(s): AST, ALT, ALKPHOS, BILITOT, PROT, ALBUMIN in the last 72 hours. No results for input(s): LIPASE, AMYLASE in the last 72 hours. Cardiac Enzymes No results for input(s): CKTOTAL, CKMB, CKMBINDEX, TROPONINI in the last 72 hours.  BNP: BNP (last 3 results) Recent Labs    03/13/18 1712 05/04/18 0934 09/05/18 1048  BNP 163.3* 141.8* 116.6*   ProBNP (last 3 results) No results for input(s): PROBNP in the last 8760 hours.  D-Dimer No results for input(s): DDIMER in the last 72 hours. Hemoglobin A1C No results for input(s): HGBA1C in the last 72 hours. Fasting Lipid Panel No results for input(s): CHOL, HDL, LDLCALC, TRIG, CHOLHDL, LDLDIRECT in the last 72 hours. Thyroid Function Tests No results for input(s): TSH, T4TOTAL, T3FREE, THYROIDAB in the last 72 hours.  Invalid input(s): FREET3  Other results:  Imaging    No results found.   Medications:     Scheduled Medications: . apixaban  5 mg Oral BID  . calcium-vitamin D  1 tablet Oral Q M,W,F  . carvedilol  25 mg Oral BID  . docusate sodium  100 mg Oral Daily  . hydrALAZINE  12.5 mg Oral TID  . insulin aspart  0-15 Units Subcutaneous TID WC  . insulin aspart  0-5 Units Subcutaneous QHS  . insulin aspart  15 Units Subcutaneous QAC lunch  . insulin aspart protamine- aspart  60 Units Subcutaneous BID WC  . Livalo 4 mg tablet  4 mg Oral QAC supper  . prenatal multivitamin  1 tablet Oral Once per day on Mon Wed Fri  . sodium chloride flush  3 mL Intravenous Q12H  . torsemide  40 mg Oral  Daily    Infusions: . sodium chloride      PRN Medications: sodium chloride, acetaminophen, ALPRAZolam, meclizine, nitroGLYCERIN, ondansetron (ZOFRAN) IV, polyvinyl alcohol, sodium chloride, sodium chloride flush, zolpidem  Patient Profile   [name] is a 68 y.o. female with AS s/p TAVR, chronic diastolic CHF, PAF, CKD III, HLD, HTN, OSA, and morbid obesity  Admitted from [facility] office with marked volume overload.   Assessment/Plan   1. Acute on chronic diastolic CHF: Echo this admission with EF 60-65%, moderate LVH, grade 2 diastolic dysfunction, trivial mitral stenosis, mildly dilated RV with mildly decreased systolic function, TAVR valve looks ok, moderate pericardial effusion (no tamponade).  She has diuresed well and weight down > 20 lbs.  PYP scan suggestive of transthyretin amyloidosis, myeloma panel negative.   - Volume elevated on RHC 2/11 with preserved CO. - Volume stable on exam. Down 36 lbs total. Switched from lasix drip to torsemide yesterday. Creatinine up 2.77 to 3.22. - She already received torsemide this am. Order DCd until we reassess creatinine. I told her she can drink some extra fluid today.  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  Will need genetic testing as outpatient and will need to work on getting her tafamidis.   2. AKI on CKD stage 3: baseline creatinine around 2.  Creatinine 3 -> 2.7 -> 3.1 -> 2.8 -> 2.77 -> 3.22 - She already received torsemide today.  3. CAD: Cath in 8/19 with moderate RCA and LCx disease, severe distal LAD disease. No chest pain.  - Continue pitivastatin.  4. Atrial fibrillation: Paroxysmal. Maintaining NSR>  - Continue Eliquis.  5. HTN: SBP 140-150s - Continue Coreg 25 mg BID.  - Continue hydralazine 12.5 mg tid, will not push BP too hard with rise in creatinine.  6. Possible RLE cellulitis: Covering with doxycycline. No change.  7. LLQ cyst on abdominal US: Needs eventual pelvic CT to evaluate. No  change.  8. Constipation - Resolved.  9. Vertigo - She has PRN meclizine ordered. No change.   Georgiana Shore, NP 09/28/2018 8:38 AM   Patient seen with NP, agree with the above note.    She has diuresed well with weight down but creatinine up.  Unfortunately got torsemide this morning before we could d/c the order.  She feels good, no dyspnea walking around her room.  On exam, she is now looking euvolemic.   I will let her go home today.  She will need followup in CHF clinic next week.  She will not take a diuretic over the weekend or Monday.  She will get a BMET on Monday.  If creatinine is  coming down, can then start torsemide 40 mg daily on Tuesday.  Meds for home: doxycycline to complete a 10 day course, hydralazine 12.5 mg tid, Coreg 25 mg bid, apixaban 5 mg bid, Livalo 4 mg daily.  We will consider her for tafamidis treatment for cardiac amyloidosis.   [name] 09/28/2018 9:19 AM

## 2018-10-01 ENCOUNTER — Ambulatory Visit (HOSPITAL_COMMUNITY)
Admission: RE | Admit: 2018-10-01 | Discharge: 2018-10-01 | Disposition: A | Discharge status: Home / Self Care | Payer: Medicare Other | Source: Ambulatory Visit | Attending: Internal Medicine | Admitting: Internal Medicine

## 2018-10-01 ENCOUNTER — Other Ambulatory Visit: Payer: Self-pay | Admitting: *Deleted

## 2018-10-01 DIAGNOSIS — I130 Hypertensive heart and chronic kidney disease with heart failure and stage 1 through stage 4 chronic kidney disease, or unspecified chronic kidney disease: Secondary | ICD-10-CM | POA: Diagnosis not present

## 2018-10-01 DIAGNOSIS — I5032 Chronic diastolic (congestive) heart failure: Secondary | ICD-10-CM | POA: Insufficient documentation

## 2018-10-01 DIAGNOSIS — Z794 Long term (current) use of insulin: Secondary | ICD-10-CM | POA: Diagnosis not present

## 2018-10-01 DIAGNOSIS — Z7901 Long term (current) use of anticoagulants: Secondary | ICD-10-CM | POA: Diagnosis not present

## 2018-10-01 DIAGNOSIS — R1907 Generalized intra-abdominal and pelvic swelling, mass and lump: Secondary | ICD-10-CM

## 2018-10-01 DIAGNOSIS — I480 Paroxysmal atrial fibrillation: Secondary | ICD-10-CM | POA: Diagnosis not present

## 2018-10-01 DIAGNOSIS — N183 Chronic kidney disease, stage 3 (moderate): Secondary | ICD-10-CM | POA: Diagnosis not present

## 2018-10-01 DIAGNOSIS — I2510 Atherosclerotic heart disease of native coronary artery without angina pectoris: Secondary | ICD-10-CM | POA: Diagnosis not present

## 2018-10-01 DIAGNOSIS — E1122 Type 2 diabetes mellitus with diabetic chronic kidney disease: Secondary | ICD-10-CM | POA: Diagnosis not present

## 2018-10-01 DIAGNOSIS — I5033 Acute on chronic diastolic (congestive) heart failure: Secondary | ICD-10-CM | POA: Diagnosis not present

## 2018-10-01 DIAGNOSIS — L03115 Cellulitis of right lower limb: Secondary | ICD-10-CM | POA: Diagnosis not present

## 2018-10-01 LAB — BASIC METABOLIC PANEL
Anion gap: 12 (ref 5–15)
BUN: 73 mg/dL — ABNORMAL HIGH (ref 8–23)
CO2: 29 mmol/L (ref 22–32)
Calcium: 8.9 mg/dL (ref 8.9–10.3)
Chloride: 99 mmol/L (ref 98–111)
Creatinine, Ser: 2.46 mg/dL — ABNORMAL HIGH (ref 0.44–1.00)
GFR calc Af Amer: 23 mL/min — ABNORMAL LOW (ref >60)
GFR calc non Af Amer: 20 mL/min — ABNORMAL LOW (ref >60)
Glucose, Bld: 322 mg/dL — ABNORMAL HIGH (ref 70–99)
Potassium: 3.8 mmol/L (ref 3.5–5.1)
Sodium: 140 mmol/L (ref 135–145)

## 2018-10-01 NOTE — Progress Notes (Signed)
LM for pt to call back - she had abnormal [place] abd/pel in hosp and needs follow up -jhb   CT pelvis ordered already - pt not aware at this time.

## 2018-10-04 DIAGNOSIS — N183 Chronic kidney disease, stage 3 (moderate): Secondary | ICD-10-CM | POA: Diagnosis not present

## 2018-10-04 DIAGNOSIS — I2510 Atherosclerotic heart disease of native coronary artery without angina pectoris: Secondary | ICD-10-CM | POA: Diagnosis not present

## 2018-10-04 DIAGNOSIS — I130 Hypertensive heart and chronic kidney disease with heart failure and stage 1 through stage 4 chronic kidney disease, or unspecified chronic kidney disease: Secondary | ICD-10-CM | POA: Diagnosis not present

## 2018-10-04 DIAGNOSIS — I5033 Acute on chronic diastolic (congestive) heart failure: Secondary | ICD-10-CM | POA: Diagnosis not present

## 2018-10-04 DIAGNOSIS — E1122 Type 2 diabetes mellitus with diabetic chronic kidney disease: Secondary | ICD-10-CM | POA: Diagnosis not present

## 2018-10-04 DIAGNOSIS — I480 Paroxysmal atrial fibrillation: Secondary | ICD-10-CM | POA: Diagnosis not present

## 2018-10-05 ENCOUNTER — Ambulatory Visit (INDEPENDENT_AMBULATORY_CARE_PROVIDER_SITE_OTHER): Payer: Medicare Other | Admitting: Family Medicine

## 2018-10-05 ENCOUNTER — Other Ambulatory Visit: Payer: Self-pay

## 2018-10-05 ENCOUNTER — Encounter: Payer: Self-pay | Admitting: Family Medicine

## 2018-10-05 VITALS — BP 125/55 | HR 55 | Temp 97.0°F | Ht 67.0 in | Wt 212.0 lb

## 2018-10-05 DIAGNOSIS — I350 Nonrheumatic aortic (valve) stenosis: Secondary | ICD-10-CM

## 2018-10-05 DIAGNOSIS — I10 Essential (primary) hypertension: Secondary | ICD-10-CM | POA: Diagnosis not present

## 2018-10-05 DIAGNOSIS — Z89412 Acquired absence of left great toe: Secondary | ICD-10-CM | POA: Diagnosis not present

## 2018-10-05 DIAGNOSIS — I739 Peripheral vascular disease, unspecified: Secondary | ICD-10-CM

## 2018-10-05 DIAGNOSIS — E1142 Type 2 diabetes mellitus with diabetic polyneuropathy: Secondary | ICD-10-CM | POA: Diagnosis not present

## 2018-10-05 DIAGNOSIS — R7989 Other specified abnormal findings of blood chemistry: Secondary | ICD-10-CM

## 2018-10-05 DIAGNOSIS — R1907 Generalized intra-abdominal and pelvic swelling, mass and lump: Secondary | ICD-10-CM

## 2018-10-05 DIAGNOSIS — I2510 Atherosclerotic heart disease of native coronary artery without angina pectoris: Secondary | ICD-10-CM | POA: Diagnosis not present

## 2018-10-05 NOTE — Progress Notes (Signed)
Subjective:    Patient ID: [name], female    DOB: August 05, 1951, 68 y.o.   MRN: [id number]  HPI Patient here today for 4 week follow up on Bp and medication changes.  She did have an ultrasound which revealed a 6 x 7 x 4 cm anechoic left lower quadrant cystic mass of uncertain etiology and it was recommended that she have a CT scan to further evaluate this and this is been scheduled the patient was recently admitted to the hospital and 37 pounds of fluid were diuresed off of her.  Her weight today is 212 pounds previously it was 248.  As a result of that hospitalization, the patient had an ultrasound of the abdomen and a 6 x 7 x 4 cm left lower quadrant cystic mass was found and a CT scan was recommended for this and this will be done on March 3.  The patient does have heart failure.  She is also taking 600 of calcium plus D.  Her last calcium level was 8.9 and this was on Monday.  The potassium sodium and chloride were good.  The blood sugar was 322 at that time.  The creatinine was 2.46 at that time and lower than it was 7 days prior to that.  At her last office visit her losartan was decreased from 50-25 and her hydralazine was increased to 3 times a day instead of twice a day.    Patient Active Problem List   Diagnosis Date Noted  . Anasarca 09/19/2018  . Acute encephalopathy 06/16/2018  . 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 Acute on chronic diastolic CHF (congestive heart failure) ([place]) 05/10/2018  . Morbid obesity ([place])   . Hypertension   . CKD (chronic kidney disease), stage III ([place])   . Sleep apnea   . Hyperlipidemia   . GERD (gastroesophageal reflux disease)   . Atrial fibrillation ([place])   . S/P TAVR (transcatheter aortic valve replacement)   . Severe aortic stenosis   . Vertigo 05/06/2015  . TIA (transient ischemic attack), history of 11/07/2014  . 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 CKD stage 3 due to type 2 diabetes mellitus ([place]) 06/03/2014  . Type II diabetes  mellitus with renal manifestations ([place]) 06/03/2014  . Type II diabetes mellitus with peripheral circulatory disorder ([place]) 06/03/2014  . Benign essential HTN 12/26/2008  . Coronary artery disease, non-occlusive 12/26/2008  . Pericardial effusion 12/26/2008   Outpatient Encounter Medications as of 10/05/2018  Medication Sig  . ACCU-CHEK AVIVA PLUS test strip USE TO CHECK BLOOD SUGAR UP TO TWICE DAILY OR AS INSTRUCTED  . apixaban (ELIQUIS) 5 MG TABS tablet Take 1 tablet (5 mg total) by mouth 2 (two) times daily.  . B-D INS SYR ULTRAFINE 1CC/31G 31G X 5/16" 1 ML MISC USE TO INJECT INSULIN TWICE A DAY AS INSTRUCTED (Patient taking differently: 1 Stick by Other route 3 (three) times daily before meals. )  . carvedilol (COREG) 25 MG tablet Take 1 tablet (25 mg total) by mouth 2 (two) times daily.  . Cholecalciferol (VITAMIN D3) 50 MCG (2000 UT) TABS Take 2,000 Units by mouth daily.  . hydrALAZINE (APRESOLINE) 10 MG tablet Take 10 mg by mouth 3 (three) times daily.  Marland Kitchen LIVALO 4 MG TABS TAKE 1 TABLET BY MOUTH ONCE DAILY  . NOVOLIN 70/30 RELION (70-30) 100 UNIT/ML injection Inject 15-65 Units into the skin See admin instructions. Taking 65 units with breakfast and 20 units at lunch and 65 units with  dinner.  . Prenatal Vit-Fe Fumarate-FA (PRENATAL MULTIVITAMIN) TABS tablet Take 1 tablet by mouth 3 (three) times a week.  . torsemide (DEMADEX) 20 MG tablet Take 2 tablets (40 mg total) by mouth daily. Please wait to hear back about labs on Monday.  . Calcium Carbonate-Vitamin D (CALCIUM 600+D) 600-400 MG-UNIT per tablet Take 1 tablet by mouth 2 (two) times daily. (Patient taking differently: Take 1 tablet by mouth 3 (three) times a week. )  . clindamycin (CLEOCIN) 300 MG capsule Take 2 tablets (600 mg) one hour prior to dental visits. (Patient not taking: Reported on 10/05/2018)  . Insulin Pen Needle 32G X 4 MM MISC Use to inject insulin with insulin pen  . meclizine (ANTIVERT) 12.5 MG tablet Take 12.5 mg by  mouth 3 (three) times daily as needed for dizziness.  . mupirocin ointment (BACTROBAN) 2 % Apply 1 application topically 2 (two) times daily. (Patient not taking: Reported on 10/05/2018)  . ondansetron (ZOFRAN ODT) 4 MG disintegrating tablet Take 1 tablet (4 mg total) by mouth every 8 (eight) hours as needed for nausea. (Patient not taking: Reported on 10/05/2018)  . Propylene Glycol (SYSTANE COMPLETE) 0.6 % SOLN Apply 1 drop to eye daily as needed (dry eyes).   . sodium chloride (OCEAN) 0.65 % SOLN nasal spray Place 1 spray into both nostrils as needed for congestion. (Patient not taking: Reported on 10/05/2018)  . vitamin E 400 UNIT capsule Take 400 Units by mouth 3 (three) times a week.   . [DISCONTINUED] doxycycline (VIBRA-TABS) 100 MG tablet Take 1 tablet (100 mg total) by mouth every 12 (twelve) hours.  . [DISCONTINUED] hydrALAZINE (APRESOLINE) 25 MG tablet Take 0.5 tablets (12.5 mg total) by mouth 3 (three) times daily.  . [DISCONTINUED] Olmesartan-Amlodipine-HCTZ (TRIBENZOR) 40-5-12.5 MG TABS Take 1 tablet by mouth daily.     No facility-administered encounter medications on file as of 10/05/2018.      Review of Systems  Constitutional: Negative.   HENT: Negative.   Eyes: Negative.   Respiratory: Negative.   Cardiovascular: Negative.   Gastrointestinal: Positive for vomiting (since stomach surgery - random ).  Endocrine: Negative.   Genitourinary: Negative.   Musculoskeletal: Negative.   Skin: Negative.   Allergic/Immunologic: Negative.   Neurological: Negative.   Hematological: Negative.   Psychiatric/Behavioral: Negative.        Objective:   Physical Exam Vitals signs and nursing note reviewed.  Constitutional:      General: She is not in acute distress.    Appearance: She is well-developed. She is obese. She is not ill-appearing.     Comments: The patient is pleasant and calm and comes to the visit with her husband today following her most recent hospitalization for heart  failure and removal of 37 pounds of fluid weight.  HENT:     Head: Normocephalic and atraumatic.     Right Ear: External ear normal.     Left Ear: External ear normal.     Nose: Nose normal. No congestion.     Mouth/Throat:     Mouth: Mucous membranes are moist.     Pharynx: Oropharynx is clear.  Eyes:     General: No scleral icterus.       Right eye: No discharge.        Left eye: No discharge.     Extraocular Movements: Extraocular movements intact.     Conjunctiva/sclera: Conjunctivae normal.     Pupils: Pupils are equal, round, and reactive to light.  Neck:  Musculoskeletal: Normal range of motion and neck supple.     Thyroid: No thyromegaly.     Vascular: No carotid bruit or JVD.  Cardiovascular:     Rate and Rhythm: Normal rate and regular rhythm.     Heart sounds: Normal heart sounds. No murmur.     Comments: The heart is regular at 60/min and there is no edema. Pulmonary:     Effort: Pulmonary effort is normal.     Breath sounds: Normal breath sounds. No wheezing or rales.     Comments: Clear anteriorly and posteriorly Abdominal:     General: Bowel sounds are normal.     Palpations: Abdomen is soft. There is no mass.     Tenderness: There is no abdominal tenderness.     Comments: Abdominal obesity with no liver or spleen being palpable and no masses and no bruits.  Musculoskeletal: Normal range of motion.        General: No tenderness.     Right lower leg: No edema.     Left lower leg: No edema.  Lymphadenopathy:     Cervical: No cervical adenopathy.  Skin:    General: Skin is warm and dry.     Findings: No rash.  Neurological:     General: No focal deficit present.     Mental Status: She is alert and oriented to person, place, and time.     Cranial Nerves: No cranial nerve deficit.     Deep Tendon Reflexes: Reflexes are normal and symmetric. Reflexes normal.  Psychiatric:        Mood and Affect: Mood normal.        Behavior: Behavior normal.         Thought Content: Thought content normal.        Judgment: Judgment normal.     Comments: Mood affect and behavior for this patient are normal for her.    BP (!) 125/55 (BP Location: Left Arm)   Pulse (!) 55   Temp (!) 97 F (36.1 C) (Oral)   Ht 5\' 7"  (1.702 m)   Wt 212 lb (96.2 kg)   BMI 33.20 kg/m         Assessment & Plan:  1. Elevated systolic blood pressure reading with diagnosis of hypertension -Patient says the blood pressure actually high in the morning and low in the afternoon.  We will not make any changes to her medications at this point in time.  She is taking hydralazine 10 mg 3 times a day instead of the 12.5 that was written in the original record.  She had a lot of samples of this at home.  The blood pressure here this morning was good.  2. Low serum vitamin D - continue with vitamin D and calcium replacement twice daily  3. Nonrheumatic aortic valve stenosis -Patient is status post TAVR.   4. Generalized intra-abdominal and pelvic swelling, mass and lump -Patient has a cystic lesion in her lower abdomen and she is scheduled for a CT scan on March 3 at [facility] in [place].  This was found incidentally from an ultrasound while she was in the hospital recently.  5. Peripheral artery disease ([place]) -This is an ongoing issue and is especially concerning because of her diabetes.  6. Status post amputation of left great toe ([place])  7. Type 2 diabetes mellitus with peripheral neuropathy (H[facility]) -Continue to monitor blood sugars closely at home  Patient Instructions  Follow-up with cardiology as planned Take blood  pressure readings along with weights and current medications with dosages with you to that visit We will see you back in about 6 weeks Do not forget the CT scan of the pelvis that is planned for March 3 We will call you with the results of that report as soon as it is reported Monitor blood sugars closely  Arrie Senate MD

## 2018-10-05 NOTE — Patient Instructions (Signed)
Follow-up with cardiology as planned Take blood pressure readings along with weights and current medications with dosages with you to that visit We will see you back in about 6 weeks Do not forget the CT scan of the pelvis that is planned for March 3 We will call you with the results of that report as soon as it is reported Monitor blood sugars closely

## 2018-10-05 NOTE — Patient Outreach (Addendum)
[facility]) Care Management  10/05/2018  [name] 16-Nov-1950 [id number]    EMMI-HF RED ON EMMI ALERT Day # 3 Date: 10/04/2018 Red Alert Reason: "Weight: 210 lbs, previous weight: 207 lbs"   Outreach attempt # 1 to patient. Spoke with patient who voices she is doing well. She denies any acute issues or concerns at this time.Reviewed and addressed red alert. Patient confirmed that her weight did go up some. She states that she had been unable to take diuretic until her kidney lab workup results were back. She states she did not get the okay to take meds until a day ago and believes that's why her weight went up. She reports that weight is back down this morning at 206 lbs. She denies any SOB, edema or other worsening symptoms. RN CM reviewed with patient weight parameters and went to alert MD of changes in weight. She voiced understanding and states that [facility] had told her the same info. She goes to today for PCP and goes to appt at HF clinic next week. No issues with transportation. No further RN CM needs or concerns noted at this time. Advised patient that they would continue to get automated EMMI- HF post discharge calls to assess how they are doing following recent hospitalization and will receive a call from a nurse if any of their responses were abnormal. Patient voiced understanding and was appreciative of f/u call.    Plan: RN CM will close case as no further interventions needed at this time.   Enzo Montgomery, RN,BSN,CCM [facility] Telephonic Care Management Coordinator Direct Phone: [phone] Toll Free: [phone] Fax: [phone]

## 2018-10-08 ENCOUNTER — Ambulatory Visit (HOSPITAL_COMMUNITY)
Admit: 2018-10-08 | Discharge: 2018-10-08 | Disposition: A | Discharge status: Home / Self Care | Payer: Medicare Other | Source: Ambulatory Visit | Attending: Cardiology | Admitting: Cardiology

## 2018-10-08 ENCOUNTER — Other Ambulatory Visit: Payer: Self-pay | Admitting: *Deleted

## 2018-10-08 ENCOUNTER — Other Ambulatory Visit: Payer: Self-pay

## 2018-10-08 ENCOUNTER — Encounter (HOSPITAL_COMMUNITY): Payer: Self-pay

## 2018-10-08 VITALS — BP 126/72 | HR 60 | Wt 212.8 lb

## 2018-10-08 DIAGNOSIS — I5032 Chronic diastolic (congestive) heart failure: Secondary | ICD-10-CM

## 2018-10-08 DIAGNOSIS — R0609 Other forms of dyspnea: Secondary | ICD-10-CM | POA: Diagnosis not present

## 2018-10-08 DIAGNOSIS — E785 Hyperlipidemia, unspecified: Secondary | ICD-10-CM | POA: Insufficient documentation

## 2018-10-08 DIAGNOSIS — Z8249 Family history of ischemic heart disease and other diseases of the circulatory system: Secondary | ICD-10-CM | POA: Insufficient documentation

## 2018-10-08 DIAGNOSIS — Z952 Presence of prosthetic heart valve: Secondary | ICD-10-CM | POA: Insufficient documentation

## 2018-10-08 DIAGNOSIS — Z9884 Bariatric surgery status: Secondary | ICD-10-CM | POA: Diagnosis not present

## 2018-10-08 DIAGNOSIS — Z7901 Long term (current) use of anticoagulants: Secondary | ICD-10-CM | POA: Insufficient documentation

## 2018-10-08 DIAGNOSIS — I10 Essential (primary) hypertension: Secondary | ICD-10-CM | POA: Diagnosis not present

## 2018-10-08 DIAGNOSIS — E1122 Type 2 diabetes mellitus with diabetic chronic kidney disease: Secondary | ICD-10-CM | POA: Diagnosis not present

## 2018-10-08 DIAGNOSIS — I313 Pericardial effusion (noninflammatory): Secondary | ICD-10-CM | POA: Diagnosis not present

## 2018-10-08 DIAGNOSIS — K219 Gastro-esophageal reflux disease without esophagitis: Secondary | ICD-10-CM | POA: Diagnosis not present

## 2018-10-08 DIAGNOSIS — Z79899 Other long term (current) drug therapy: Secondary | ICD-10-CM | POA: Diagnosis not present

## 2018-10-08 DIAGNOSIS — K589 Irritable bowel syndrome without diarrhea: Secondary | ICD-10-CM | POA: Diagnosis not present

## 2018-10-08 DIAGNOSIS — N183 Chronic kidney disease, stage 3 unspecified: Secondary | ICD-10-CM

## 2018-10-08 DIAGNOSIS — Z8673 Personal history of transient ischemic attack (TIA), and cerebral infarction without residual deficits: Secondary | ICD-10-CM | POA: Diagnosis not present

## 2018-10-08 DIAGNOSIS — Z794 Long term (current) use of insulin: Secondary | ICD-10-CM | POA: Insufficient documentation

## 2018-10-08 DIAGNOSIS — Z6833 Body mass index (BMI) 33.0-33.9, adult: Secondary | ICD-10-CM | POA: Diagnosis not present

## 2018-10-08 DIAGNOSIS — I480 Paroxysmal atrial fibrillation: Secondary | ICD-10-CM | POA: Insufficient documentation

## 2018-10-08 DIAGNOSIS — I2510 Atherosclerotic heart disease of native coronary artery without angina pectoris: Secondary | ICD-10-CM | POA: Diagnosis not present

## 2018-10-08 DIAGNOSIS — I130 Hypertensive heart and chronic kidney disease with heart failure and stage 1 through stage 4 chronic kidney disease, or unspecified chronic kidney disease: Secondary | ICD-10-CM | POA: Diagnosis not present

## 2018-10-08 LAB — BASIC METABOLIC PANEL
Anion gap: 11 (ref 5–15)
BUN: 59 mg/dL — ABNORMAL HIGH (ref 8–23)
CO2: 28 mmol/L (ref 22–32)
Calcium: 9 mg/dL (ref 8.9–10.3)
Chloride: 100 mmol/L (ref 98–111)
Creatinine, Ser: 2.39 mg/dL — ABNORMAL HIGH (ref 0.44–1.00)
GFR calc Af Amer: 24 mL/min — ABNORMAL LOW (ref >60)
GFR calc non Af Amer: 20 mL/min — ABNORMAL LOW (ref >60)
Glucose, Bld: 383 mg/dL — ABNORMAL HIGH (ref 70–99)
Potassium: 4.4 mmol/L (ref 3.5–5.1)
Sodium: 139 mmol/L (ref 135–145)

## 2018-10-08 NOTE — Patient Outreach (Signed)
Patient triggered Red on EMMI Heart Failure Dashboard, notification sent to Kimberly Gibbs, RN 

## 2018-10-08 NOTE — Progress Notes (Signed)
PCP: Dr Laurance Flatten  Primary Cardiologist: Dr Percival Spanish  HF MD: Dr Aundra Dubin   HPI: [name] Lawsonis a 68 y.o.femalewith AS s/p TAVR, chronic diastolic CHF, PAF, CKD III, HLD, HTN, OSA, and morbid obesity.    She was admitted from [facility] office 09/19/18 with marked volume overload. She diuresed 36 lbs with lasix drip and was transitioned to torsemide 40 mg daily. Course complicated by AKI. RHC completed and showed elevated filling pressures and preserved cardiac output.  PYP scan suggestive of TTR amyloid. Discharge weight was 208 po\unds.   She returns for post hospital follow up. Overall feeling fine. Mild dyspnea with exertion. Denies PND/Orthopnea. Appetite ok. No fever or chills. Weight at home has been 206 pounds. Taking all medications. Says morning BP has been high but at lunch SBP drops to 110 and makes her feel weird so she would like to stop.   Cardiac Testing.  PYP 09/24/18: Visual and quantitative assessment (grade 2, H/CL equal 1.8) are suggestive of transthyretin amyloidosis. R/LHC 09/25/18: 1. Elevated right and left heart filling pressures.  2. Pulmonary venous hypertension.  3. Preserved cardiac output.  RA mean 13 RV 52/14 PA 50/21, mean 33 PCWP mean 25 Oxygen saturations: PA 64% AO 95% Cardiac Output (Fick) 6.16  Cardiac Index (Fick) 2.92 PVR 1.3 WU Echo 09/20/18: Left Ventricle: The left ventricle has normal systolic function of 14-97%. The cavity size was normal. There is moderately increased left ventricular wall thickness. Echo evidence of pseudonormalization in diastolic relaxation No evidence of left  ventricular regional wall motion abnormalities.. Right Ventricle: The right ventricle has mildly reduced systolic function. The cavity was mildly enlarged. There is no increase in right ventricular wall thickness. The right ventricle was not well visualized. Left Atrium: left atrial size was normal in size Right Atrium: right atrial size was mildly  dilated Interatrial Septum: The interatrial septum was not well visualized. Pericardium: A moderately sized pericardial effusion is present. The pericardial effusion is posterior to the left ventricle. Mitral Valve: The mitral valve is degenerative in appearance. There is moderate calcification. There is moderate to severe mitral annular calcification present. Mitral valve regurgitation is not visualized by color flow Doppler. Tricuspid Valve: The tricuspid valve is normal in structure. Tricuspid valve regurgitation was not visualized by color flow Doppler. Aortic Valve: Bioprosthetic aortic valve s/p TAVR. Aortic valve regurgitation was not visualized by color flow Doppler. Pulmonic Valve: The pulmonic valve was grossly normal. Pulmonic valve regurgitation is trivial by color flow Doppler. Aorta: The aortic root is normal in size and structure. Venous: The inferior vena cava is dilated in size with less than 50% respiratory variability.  ROS: All systems negative except as listed in HPI, PMH and Problem List.  SH:  Social History   Socioeconomic History  . Marital status: Married    Spouse name: Not on file  . Number of children: 3  . Years of education: some college  . Highest education level: Some college, no degree  Occupational History  . Occupation: retired    Fish farm manager: [employer]  . Financial resource strain: Not hard at all  . Food insecurity:    Worry: Never true    Inability: Never true  . Transportation needs:    Medical: No    Non-medical: No  Tobacco Use  . Smoking status: Never Smoker  . Smokeless tobacco: Never Used  Substance and Sexual Activity  . Alcohol use: Yes    Alcohol/week: 0.0 standard drinks    Comment:  "  mixed drink q few years"  . Drug use: Never  . Sexual activity: Yes    Birth control/protection: Post-menopausal  Lifestyle  . Physical activity:    Days per week: 0 days    Minutes per session: 0 min  . Stress: Only a  little  Relationships  . Social connections:    Talks on phone: More than three times a week    Gets together: More than three times a week    Attends religious service: More than 4 times per year    Active member of club or organization: Yes    Attends meetings of clubs or organizations: More than 4 times per year    Relationship status: Married  . Intimate partner violence:    Fear of current or ex partner: No    Emotionally abused: No    Physically abused: No    Forced sexual activity: No  Other Topics Concern  . Not on file  Social History Narrative   Married   Patient is right handed.   Patient rarely drinks caffeine.   Lives at home with husband in two story home. Husband washes laundry in the basement. They have 3 daughters and 7 grandchildren.     FH:  Family History  Problem Relation Age of Onset  . Heart failure Mother   . Hypertension Mother   . Skin cancer Mother   . Colitis Mother   . Diabetes Mother   . Kidney Stones Mother   . Stroke Mother   . Heart disease Father   . Colon polyps Father   . Diabetes Father   . Heart failure Father   . Heart attack Sister   . Diabetes Other   . Stroke Maternal Grandfather   . Diabetes Maternal Grandfather   . Colon cancer Neg Hx     Past Medical History:  Diagnosis Date  . Atrial fibrillation ([place])    a. maintaining sinus s/p DCCV, on Eliquis  . CAD (coronary artery disease)    a. Nonobstructive by cath 2006 - 25% LAD, 25-30% after 1st diag, distal 25% PDA, minor irreg LCx. b. Normal nuc 2011.  . CKD (chronic kidney disease), stage III ([place])   . Cystocele   . Difficult intubation 09-07-2012   big neck trouble with intubation parotid surgery"takes little med to sedate"  . Esophageal stricture   . GERD (gastroesophageal reflux disease)   . Hiatal hernia    a. 07/2013: HH with small stricture holding up barium tablet (concides with patient's sx of food sticking).  . History of kidney stones   . Hyperlipidemia    . Hypertension   . IBS (irritable bowel syndrome)   . Memory difficulty 11/26/2015  . Morbid obesity ([place])    a. hx of gastric bypass (sleeve gastrectomy 2015)  . Osteoporosis   . 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  Marland Kitchen Pericardial effusion    a. Mod by echo 2012 at time of PNA. b. Echo 07/2012: small pericardial effusion vs fat.  . S/P TAVR (transcatheter aortic valve replacement)   . Severe aortic stenosis   . Sleep apnea     "have mask; don't use it" (02/14/2018)  . TIA (transient ischemic attack) 10/2014; 06/2016   "some memory issues since; daughter says my speech is sometimes different" (02/14/2018)  . Type II diabetes mellitus ([place])     Current Outpatient Medications  Medication Sig Dispense Refill  . ACCU-CHEK AVIVA PLUS test strip USE TO  CHECK BLOOD SUGAR UP TO TWICE DAILY OR AS INSTRUCTED 100 each 2  . apixaban (ELIQUIS) 5 MG TABS tablet Take 1 tablet (5 mg total) by mouth 2 (two) times daily. 180 tablet 3  . B-D INS SYR ULTRAFINE 1CC/31G 31G X 5/16" 1 ML MISC USE TO INJECT INSULIN TWICE A DAY AS INSTRUCTED (Patient taking differently: 1 Stick by Other route 3 (three) times daily before meals. ) 100 each 2  . Calcium Carbonate-Vitamin D (CALCIUM 600+D) 600-400 MG-UNIT per tablet Take 1 tablet by mouth 2 (two) times daily. (Patient taking differently: Take 1 tablet by mouth 3 (three) times a week. )    . carvedilol (COREG) 25 MG tablet Take 1 tablet (25 mg total) by mouth 2 (two) times daily. 60 tablet 11  . Cholecalciferol (VITAMIN D3) 50 MCG (2000 UT) TABS Take 2,000 Units by mouth daily.    . hydrALAZINE (APRESOLINE) 10 MG tablet Take 10 mg by mouth 3 (three) times daily.    . Insulin Pen Needle 32G X 4 MM MISC Use to inject insulin with insulin pen 100 each 4  . LIVALO 4 MG TABS TAKE 1 TABLET BY MOUTH ONCE DAILY 90 tablet 1  . meclizine (ANTIVERT) 12.5 MG tablet Take 12.5 mg by mouth 3 (three) times daily as needed for dizziness.    . mupirocin ointment  (BACTROBAN) 2 % Apply 1 application topically 2 (two) times daily. 22 g 0  . NOVOLIN 70/30 RELION (70-30) 100 UNIT/ML injection Inject 15-65 Units into the skin See admin instructions. Taking 65 units with breakfast and 20 units at lunch and 65 units with dinner.    . ondansetron (ZOFRAN ODT) 4 MG disintegrating tablet Take 1 tablet (4 mg total) by mouth every 8 (eight) hours as needed for nausea. 10 tablet 0  . Prenatal Vit-Fe Fumarate-FA (PRENATAL MULTIVITAMIN) TABS tablet Take 1 tablet by mouth 3 (three) times a week.    Marland Kitchen Propylene Glycol (SYSTANE COMPLETE) 0.6 % SOLN Apply 1 drop to eye daily as needed (dry eyes).     . torsemide (DEMADEX) 20 MG tablet Take 20 mg by mouth 2 (two) times daily.    . vitamin E 400 UNIT capsule Take 400 Units by mouth 3 (three) times a week.     Marland Kitchen apixaban (ELIQUIS) 5 MG TABS tablet Take 1 tablet (5 mg total) by mouth 2 (two) times daily. 180 tablet 3  . Calcium Carbonate-Vitamin D (CALCIUM 600+D) 600-400 MG-UNIT per tablet Take 1 tablet by mouth 2 (two) times daily. (Patient taking differently: Take 1 tablet by mouth 3 (three) times a week. )    . clindamycin (CLEOCIN) 300 MG capsule Take 2 tablets (600 mg) one hour prior to dental visits. (Patient not taking: Reported on 10/05/2018) 6 capsule 3  . NOVOLIN 70/30 RELION (70-30) 100 UNIT/ML injection Inject 15-65 Units into the skin See admin instructions. Taking 65 units with breakfast and 20 units at lunch and 65 units with dinner.    . sodium chloride (OCEAN) 0.65 % SOLN nasal spray Place 1 spray into both nostrils as needed for congestion. 15 mL 0   No current facility-administered medications for this encounter.     Vitals:   10/08/18 1125  BP: 126/72  Pulse: 60  SpO2: 96%  Weight: 96.5 kg (212 lb 12.8 oz)   Wt Readings from Last 3 Encounters:  10/08/18 96.5 kg (212 lb 12.8 oz)  10/05/18 96.2 kg (212 lb)  09/28/18 94.6 kg (208 lb 8 oz)  PHYSICAL EXAM: General:  Well appearing. No resp  difficulty HEENT: normal Neck: supple. JVP flat. Carotids 2+ bilaterally; no bruits. No lymphadenopathy or thryomegaly appreciated. Cor: PMI normal. Regular rate & rhythm. No rubs, gallops or murmurs. Lungs: clear Abdomen: obese, soft, nontender, nondistended. No hepatosplenomegaly. No bruits or masses. Good bowel sounds. Extremities: no cyanosis, clubbing, rash, edema Neuro: alert & orientedx3, cranial nerves grossly intact. Moves all 4 extremities w/o difficulty. Affect pleasant.      ASSESSMENT & PLAN: 1. Chronic diastolic CHF: Echo 10/2353 EF 60-65%, moderate LVH, grade 2 diastolic dysfunction, trivial mitral stenosis, mildly dilated RV with mildly decreased systolic function, TAVR valve looks ok, moderate pericardial effusion (no tamponade). Diuresed 36 lbs with lasix drip. PYP scan suggestive of transthyretin amyloidosis, myeloma panel negative.  - Completed [employer] Genetic kit today. 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  - NYHA II-III. Volume status stable. Continue torsemide 20 mg twice a day.  - Check BMET today.   2. CKD stage 3: baseline creatinine around 2.  Check BMET today  3. CAD: Cath in 8/19 with moderate RCA and LCx disease, severe distal LAD disease. -No chest pain.  Continue pitivastatin.  4. Atrial fibrillation: Paroxysmal. -Regular pulse.   - Continue Eliquis.  5. HTN:  - Stable today. Hold hydralazine for now. She was asked to record BP and call back in 1 week.  - Continue Coreg 25 mg BID. 6. Obesity Body mass index is 33.33 kg/m.    Follow up in 2 months with Dr Aundra Dubin.   Amy Clegg NP-C  2:26 PM

## 2018-10-08 NOTE — Patient Outreach (Addendum)
[place] [facility]) Care Management  10/08/2018  [name] 01/02/1951 [id number]   EMMI- Heart failure RED ON EMMI ALERT Day # 6 Date:  Red Alert Reason: Lost interest in things they used to enjoy? 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 in the last 6 months  Can't go to [employer] to stay for hours   Outreach attempt #1  Patient is able to verify HIPAA [place] Management RN reviewed and addressed red alert with patient   EMMI Mrs Alison reports the answer to the EMMI question was correct but denies depression She reports she is not able to do some of the things she previously was able to do related to medical issues especially her CHF but feels that she is building herself back up to be able to do those things She shared for instance she was not able to go to her local [employer] and stay for long periods of time She reports she "don't deal with depression"  CM discussed with her the [facility] SW services for community resources for counseling services or socialization services She informs CM she is not in need of them at this time but is appreciative of knowing they are available  She reports improvements in her care needs and reports going out to get labs completed to day at [facility] She reports having 36-37 lbs of fluid removed during her admission wt today 206 lbs   She reports now having her grandson visiting who wanting to spend time with her since her d/c home.   Social: Mrs [name] lives with her husband and has support of her daughters x3 and grandchildren She is independent with all her care needs and denies issues with transportation to medical appointments   Conditions:  AS s/p TAVR, HTN, CAD, TIA, DM type 2, severe aortic stenosis, stroke atrial fibrillation sleep apnea, CKD stage 3, anasarca, HDL, morbid obesity  Vertigo hx of gastric bypass   DME Eyeglasses, scales cbg meter bp cuff    Medications: denies  concerns with taking medications as prescribed, affording medications, side effects of medications and questions about medications   Appointments: 11/16/18 primary care MD f/u  12/13/2018 cardiology f/u    Advance Directives: Denies need for assist with advance directives   Consent: [facility] RN CM reviewed [facility] services with patient. Patient gave verbal consent for services.   Advised patient that there will be further automated EMMI- post discharge calls to assess how the patient is doing following the recent hospitalization Advised the patient that another call may be received from a nurse if any of their responses were abnormal. Patient voiced understanding and was appreciative of f/u call.   Plan: [facility] RN CM will close case at this time as patient has been assessed and no needs identified/needs resolved.   Pt encouraged to return a call to [place] CM prn  [facility] RN CM sent a successful outreach letter as discussed with [facility] brochure enclosed for review  [name] L. Lavina Hamman, RN, BSN, [place] Coordinator Office number [phone] Mobile number [phone]  Main [facility] number [phone] Fax number [phone]

## 2018-10-08 NOTE — Patient Instructions (Addendum)
Lab work done today. We will notify you of any abnormal lab work.  HOLD Hydralazine for 1 week. Check blood pressure daily for 1 week and call us at [phone] option #2 to report blood pressures.   Follow up with Dr. Aundra Dubin in 2 months

## 2018-10-09 ENCOUNTER — Telehealth (HOSPITAL_COMMUNITY): Payer: Self-pay

## 2018-10-09 DIAGNOSIS — N183 Chronic kidney disease, stage 3 (moderate): Secondary | ICD-10-CM | POA: Diagnosis not present

## 2018-10-09 DIAGNOSIS — E1122 Type 2 diabetes mellitus with diabetic chronic kidney disease: Secondary | ICD-10-CM | POA: Diagnosis not present

## 2018-10-09 DIAGNOSIS — I5033 Acute on chronic diastolic (congestive) heart failure: Secondary | ICD-10-CM | POA: Diagnosis not present

## 2018-10-09 DIAGNOSIS — I130 Hypertensive heart and chronic kidney disease with heart failure and stage 1 through stage 4 chronic kidney disease, or unspecified chronic kidney disease: Secondary | ICD-10-CM | POA: Diagnosis not present

## 2018-10-09 DIAGNOSIS — I480 Paroxysmal atrial fibrillation: Secondary | ICD-10-CM | POA: Diagnosis not present

## 2018-10-09 DIAGNOSIS — I2510 Atherosclerotic heart disease of native coronary artery without angina pectoris: Secondary | ICD-10-CM | POA: Diagnosis not present

## 2018-10-09 NOTE — Telephone Encounter (Signed)
-----   Message from [place], NP sent at 10/08/2018  3:30 PM EST ----- Renal function ok. Glucose high. Needs follow up with PCP. Please call.

## 2018-10-09 NOTE — Telephone Encounter (Signed)
[facility] genetic testing started and sent off 10/08/2018. Forms faxed for genetic testing 10/08/2018

## 2018-10-09 NOTE — Telephone Encounter (Signed)
Spoke with pt about lab work. Pt aware and verbalized understanding.

## 2018-10-10 DIAGNOSIS — N183 Chronic kidney disease, stage 3 (moderate): Secondary | ICD-10-CM | POA: Diagnosis not present

## 2018-10-10 DIAGNOSIS — E1122 Type 2 diabetes mellitus with diabetic chronic kidney disease: Secondary | ICD-10-CM | POA: Diagnosis not present

## 2018-10-10 DIAGNOSIS — I2510 Atherosclerotic heart disease of native coronary artery without angina pectoris: Secondary | ICD-10-CM | POA: Diagnosis not present

## 2018-10-10 DIAGNOSIS — I480 Paroxysmal atrial fibrillation: Secondary | ICD-10-CM | POA: Diagnosis not present

## 2018-10-10 DIAGNOSIS — I130 Hypertensive heart and chronic kidney disease with heart failure and stage 1 through stage 4 chronic kidney disease, or unspecified chronic kidney disease: Secondary | ICD-10-CM | POA: Diagnosis not present

## 2018-10-10 DIAGNOSIS — I5033 Acute on chronic diastolic (congestive) heart failure: Secondary | ICD-10-CM | POA: Diagnosis not present

## 2018-10-11 DIAGNOSIS — I480 Paroxysmal atrial fibrillation: Secondary | ICD-10-CM | POA: Diagnosis not present

## 2018-10-11 DIAGNOSIS — N183 Chronic kidney disease, stage 3 (moderate): Secondary | ICD-10-CM | POA: Diagnosis not present

## 2018-10-11 DIAGNOSIS — I2510 Atherosclerotic heart disease of native coronary artery without angina pectoris: Secondary | ICD-10-CM | POA: Diagnosis not present

## 2018-10-11 DIAGNOSIS — E1122 Type 2 diabetes mellitus with diabetic chronic kidney disease: Secondary | ICD-10-CM | POA: Diagnosis not present

## 2018-10-11 DIAGNOSIS — I5033 Acute on chronic diastolic (congestive) heart failure: Secondary | ICD-10-CM | POA: Diagnosis not present

## 2018-10-11 DIAGNOSIS — I130 Hypertensive heart and chronic kidney disease with heart failure and stage 1 through stage 4 chronic kidney disease, or unspecified chronic kidney disease: Secondary | ICD-10-CM | POA: Diagnosis not present

## 2018-10-15 ENCOUNTER — Other Ambulatory Visit: Payer: Self-pay | Admitting: *Deleted

## 2018-10-15 DIAGNOSIS — I2510 Atherosclerotic heart disease of native coronary artery without angina pectoris: Secondary | ICD-10-CM | POA: Diagnosis not present

## 2018-10-15 DIAGNOSIS — E1122 Type 2 diabetes mellitus with diabetic chronic kidney disease: Secondary | ICD-10-CM | POA: Diagnosis not present

## 2018-10-15 DIAGNOSIS — I5033 Acute on chronic diastolic (congestive) heart failure: Secondary | ICD-10-CM | POA: Diagnosis not present

## 2018-10-15 DIAGNOSIS — I480 Paroxysmal atrial fibrillation: Secondary | ICD-10-CM | POA: Diagnosis not present

## 2018-10-15 DIAGNOSIS — N183 Chronic kidney disease, stage 3 (moderate): Secondary | ICD-10-CM | POA: Diagnosis not present

## 2018-10-15 DIAGNOSIS — I130 Hypertensive heart and chronic kidney disease with heart failure and stage 1 through stage 4 chronic kidney disease, or unspecified chronic kidney disease: Secondary | ICD-10-CM | POA: Diagnosis not present

## 2018-10-15 NOTE — Patient Outreach (Signed)
[place] [facility]) Care Management  10/15/2018  [name] 1951-08-04 [id number]   EMMI- Heart failure, medication RED ON EMMI ALERT Day # 12 Date: Saturday 10/13/2018 1003  Red Alert Reason: New/worsening problems? Yes New swelling? Yes  Insurance: Medicare  [facility] admissions x 3 ED visits x 3  in the last 6 months    Outreach attempt # 1 successful with patient Patient is able to verify HIPAA [facility] Care Management RN reviewed and addressed red alert with patient  EMMI [name] reports that she is doing well and part of the answers are correct New/worsening problems were related to n/v related to eating too much and her gastric sleeve not being able to tolerate it. The answer to the EMMI question is not related to her CHF. She reports some "stomach acid" with some vomiting on this am but reports she has experience this before (many times with resolution later) and it was resolved after she had an emesis. Cm encouraged her to maintain the diet she was prescribed, to be careful with intake and to increa  New swelling She denies new swelling of any extremity She reports this EMMI answer of yes is incorrect. She reports the home health staff is measuring her ankles during each visit. She confirms for the last few days her wt has maintained at 200 lbs  She reports being informed that she needed to call in her BP values for a week after her HR clinic visit in which she was taken off of hydralazine for a week. She voices that her BP has increased over 130/80 and averaging at 160/69  CM provided her with the number [phone] option #2 to report blood pressures  She denies all other medical issues at this time   Appointments 11/16/2018 f/u with Josie Saunders MD 12/13/2018 f/u with cardiologist  Consent: [facility] RN CM reviewed [facility] services with patient. Patient gave verbal consent for services.   Advised patient that there will be further automated EMMI- post discharge  calls to assess how the patient is doing following the recent hospitalization Advised the patient that another call may be received from a nurse if any of their responses were abnormal. Patient voiced understanding and was appreciative of f/u call.   Plan: [facility] RN CM will close case at this time as patient has been assessed and no needs identified/needs resolved.   Pt encouraged to return a call to [facility] RN CM prn  Jaylon Grode L. Lavina Hamman, RN, BSN, [place] Coordinator Office number [phone] Mobile number [phone]  Main [facility] number [phone] Fax number [phone]

## 2018-10-16 ENCOUNTER — Other Ambulatory Visit (HOSPITAL_COMMUNITY): Payer: Self-pay

## 2018-10-16 ENCOUNTER — Ambulatory Visit (HOSPITAL_COMMUNITY)
Admission: RE | Admit: 2018-10-16 | Discharge: 2018-10-16 | Disposition: A | Discharge status: Home / Self Care | Payer: Medicare Other | Source: Ambulatory Visit | Attending: Family Medicine | Admitting: Family Medicine

## 2018-10-16 ENCOUNTER — Telehealth (HOSPITAL_COMMUNITY): Payer: Self-pay

## 2018-10-16 ENCOUNTER — Other Ambulatory Visit: Payer: Self-pay | Admitting: *Deleted

## 2018-10-16 ENCOUNTER — Encounter (HOSPITAL_COMMUNITY): Payer: Self-pay

## 2018-10-16 DIAGNOSIS — R1907 Generalized intra-abdominal and pelvic swelling, mass and lump: Secondary | ICD-10-CM | POA: Insufficient documentation

## 2018-10-16 LAB — POCT I-STAT CREATININE: Creatinine, Ser: 3 mg/dL — ABNORMAL HIGH (ref 0.44–1.00)

## 2018-10-16 MED ORDER — HYDRALAZINE HCL 25 MG PO TABS: 25.0000 mg | ORAL_TABLET | Freq: Three times a day (TID) | ORAL | 3 refills | Status: DC

## 2018-10-16 NOTE — Telephone Encounter (Signed)
Left VM for pt and sent Rx to pharmacy.

## 2018-10-16 NOTE — Telephone Encounter (Signed)
Pt recorded bp readings from 02/23-03/03  02/23: 188/56    HR: 73 At 11am after taking medicine:  113/46   HR: 59  02/24: 164/63    HR: 75  02/25: 165/59    HR: 58  02/26: 182/70    HR: 62 At dinner   204/66  02/27: 150/53   HR: 62  02/28: 174/68    HR: 73 At noon 136/61  HR: 57 At dinner 158/62  HR: 67  02/29: 153/69   HR: 74  03/01: 177/69   HR: 80 At lunch 148/69  HR: 64  03/02: 160/69   HR: 71 At lunch 160/69  HR: 71  03/03: 164/62  HR: 72   PT is currently coreg 25 mg (Take 1 tablet (25 mg total) by mouth 2 (two) times daily). Please advise.

## 2018-10-16 NOTE — Patient Outreach (Signed)
[place] [facility]) Care Management  10/16/2018  [name] 15-Apr-1951 [id number]   EMMI- Heart failure, medication RED ON EMMI ALERT Day # 14 Date: Sunday 3/1//2020 [place] Reason: any new problems? Yes  New/worsening problems? Yes Nausea/vomiting ? Yes  Insurance: Medicare  [facility] admissions x 3 ED visits x 3  in the last 6 months   Addressed this red alert on 10/15/18 pm Outreach to [name] was successful on 10/15/18 at 1548 after this EMMI day #14 was documented and the CM and Patient is reviewed and addressed red alertsfor Day 14 as listed above and day 12  10/15/18 EMMI [name] reports that she is doing well and part of the answers are correct New/worsening problems were related to n/v related to eating too much and her gastric sleeve not being able to tolerate it. The answer to the EMMI question is not related to her CHF. She reports some "stomach acid" with some vomiting on this am but reports she has experience this before (many times with resolution later) and it was resolved after she had an emesis. Cm encouraged her to maintain the diet she was prescribed, to be careful with intake and to increa   She denies all other medical issues on 10/15/18   Appointments 11/16/2018 f/u with Josie Saunders MD 12/13/2018 f/u with cardiologist  Consent: [facility] RN CM reviewed [facility] services with patient. Patient gave verbal consent for services.   Advised patient that there will be further automated EMMI-post discharge calls to assess how the patient is doing following the recent hospitalization Advised the patient that another call may be received from a nurse if any of their responses were abnormal. Patient voiced understanding and was appreciative of f/u call.   Plan: [facility] RN CM will close case at this time as patient has been assessed and no needs identified/needs resolved.   Pt encouraged to return a call to [facility] RN CM prn  Vanderbilt Ranieri L. Lavina Hamman, RN,  BSN, [place] Coordinator Office number [phone] Mobile number [phone]  Main [facility] number [phone] Fax number [phone]

## 2018-10-16 NOTE — Telephone Encounter (Signed)
Please increase her hydralazine from 10 mg TID to 25 mg TID.   Thanks.    Legrand Como [address] [place], PA-C 10/16/2018 10:28 AM

## 2018-10-17 ENCOUNTER — Ambulatory Visit (INDEPENDENT_AMBULATORY_CARE_PROVIDER_SITE_OTHER): Payer: Medicare Other

## 2018-10-17 ENCOUNTER — Other Ambulatory Visit: Payer: Self-pay | Admitting: Family Medicine

## 2018-10-17 DIAGNOSIS — L03115 Cellulitis of right lower limb: Secondary | ICD-10-CM

## 2018-10-17 DIAGNOSIS — I2510 Atherosclerotic heart disease of native coronary artery without angina pectoris: Secondary | ICD-10-CM

## 2018-10-17 DIAGNOSIS — I5033 Acute on chronic diastolic (congestive) heart failure: Secondary | ICD-10-CM | POA: Diagnosis not present

## 2018-10-17 DIAGNOSIS — R1907 Generalized intra-abdominal and pelvic swelling, mass and lump: Secondary | ICD-10-CM

## 2018-10-17 DIAGNOSIS — E1122 Type 2 diabetes mellitus with diabetic chronic kidney disease: Secondary | ICD-10-CM | POA: Diagnosis not present

## 2018-10-17 DIAGNOSIS — I130 Hypertensive heart and chronic kidney disease with heart failure and stage 1 through stage 4 chronic kidney disease, or unspecified chronic kidney disease: Secondary | ICD-10-CM

## 2018-10-17 DIAGNOSIS — Z794 Long term (current) use of insulin: Secondary | ICD-10-CM

## 2018-10-17 DIAGNOSIS — N183 Chronic kidney disease, stage 3 (moderate): Secondary | ICD-10-CM | POA: Diagnosis not present

## 2018-10-17 DIAGNOSIS — I480 Paroxysmal atrial fibrillation: Secondary | ICD-10-CM

## 2018-10-17 NOTE — Patient Outreach (Signed)
[place] [facility]) Care Management  10/17/2018  [name] [birth year] [id number]   EMMI- Heart failure  RED ON EMMI ALERT Day # 15 Date: Saturday 10/16/2018 1009  Red Alert Reason: New/worsening problems? Yes Any new problems? Yes  Insurance: Medicare  [facility] admissions x 3 ED visits x 3  in the last 6 months    Outreach attempt # 1 successful with patient Patient is able to verify HIPAA [facility] Care Management RN reviewed and addressed red alertwith patient  EMMI [name] reports that she is doing well and the EMMI answers are incorrect She denies new/worsening problems HF She shares that 10/17/18 was her last day for [facility] PT services    She informed CM she did call in her BP values but had not had a response CM informed her that per Epic noted staff had been trying to contact her on 10/16/18 to inform her to restart Hydralazine and had called her pharmacy Cm provided the number for the staff attempting to contact her Cm encouraged her to call to get an update on the treatment for her increased BP  She reports she has "lots of" Hydralazine at home  CM provided her with the number [phone]   She denies all other medical issues at this time   Appointments 11/16/2018 f/u with Josie Saunders MD 12/13/2018 f/u with cardiologist  Consent: [facility] RN CM reviewed [facility] services with patient. Patient gave verbal consent for services.   Advised patient that there will be further automated EMMI-post discharge calls to assess how the patient is doing following the recent hospitalization Advised the patient that another call may be received from a nurse if any of their responses were abnormal. Patient voiced understanding and was appreciative of f/u call.   Plan: [facility] RN CM will close case at this time as patient has been assessed and no needs identified/needs resolved.   Pt encouraged to return a call to [facility] RN CM prn   Kani Jobson L. Lavina Hamman, RN, BSN, [place] Coordinator Office number [phone] Mobile number [phone]  Main [facility] number [phone] Fax number [phone]

## 2018-10-18 ENCOUNTER — Telehealth (HOSPITAL_COMMUNITY): Payer: Self-pay

## 2018-10-18 DIAGNOSIS — I5033 Acute on chronic diastolic (congestive) heart failure: Secondary | ICD-10-CM | POA: Diagnosis not present

## 2018-10-18 DIAGNOSIS — I130 Hypertensive heart and chronic kidney disease with heart failure and stage 1 through stage 4 chronic kidney disease, or unspecified chronic kidney disease: Secondary | ICD-10-CM | POA: Diagnosis not present

## 2018-10-18 DIAGNOSIS — E1122 Type 2 diabetes mellitus with diabetic chronic kidney disease: Secondary | ICD-10-CM | POA: Diagnosis not present

## 2018-10-18 DIAGNOSIS — I2510 Atherosclerotic heart disease of native coronary artery without angina pectoris: Secondary | ICD-10-CM | POA: Diagnosis not present

## 2018-10-18 DIAGNOSIS — N183 Chronic kidney disease, stage 3 (moderate): Secondary | ICD-10-CM | POA: Diagnosis not present

## 2018-10-18 DIAGNOSIS — I480 Paroxysmal atrial fibrillation: Secondary | ICD-10-CM | POA: Diagnosis not present

## 2018-10-18 NOTE — Telephone Encounter (Signed)
Genetic testing results NEGATIVE.  Placed in MD folder for review.

## 2018-10-23 ENCOUNTER — Telehealth: Payer: Self-pay | Admitting: Family Medicine

## 2018-10-23 ENCOUNTER — Ambulatory Visit (HOSPITAL_COMMUNITY)
Admission: RE | Admit: 2018-10-23 | Discharge: 2018-10-23 | Disposition: A | Discharge status: Home / Self Care | Payer: Medicare Other | Source: Ambulatory Visit | Attending: Family Medicine | Admitting: Family Medicine

## 2018-10-23 DIAGNOSIS — R1907 Generalized intra-abdominal and pelvic swelling, mass and lump: Secondary | ICD-10-CM | POA: Insufficient documentation

## 2018-10-23 DIAGNOSIS — R1900 Intra-abdominal and pelvic swelling, mass and lump, unspecified site: Secondary | ICD-10-CM | POA: Diagnosis not present

## 2018-10-24 DIAGNOSIS — I5033 Acute on chronic diastolic (congestive) heart failure: Secondary | ICD-10-CM | POA: Diagnosis not present

## 2018-10-24 DIAGNOSIS — I130 Hypertensive heart and chronic kidney disease with heart failure and stage 1 through stage 4 chronic kidney disease, or unspecified chronic kidney disease: Secondary | ICD-10-CM | POA: Diagnosis not present

## 2018-10-24 DIAGNOSIS — N183 Chronic kidney disease, stage 3 (moderate): Secondary | ICD-10-CM | POA: Diagnosis not present

## 2018-10-24 DIAGNOSIS — E1122 Type 2 diabetes mellitus with diabetic chronic kidney disease: Secondary | ICD-10-CM | POA: Diagnosis not present

## 2018-10-24 DIAGNOSIS — I2510 Atherosclerotic heart disease of native coronary artery without angina pectoris: Secondary | ICD-10-CM | POA: Diagnosis not present

## 2018-10-24 DIAGNOSIS — I480 Paroxysmal atrial fibrillation: Secondary | ICD-10-CM | POA: Diagnosis not present

## 2018-10-24 NOTE — Telephone Encounter (Signed)
I thought that I had already reviewed this.  The MRI of the pelvis showed no evidence of pelvic mass or any other abnormality. Please call patient and let her know this result

## 2018-10-24 NOTE — Telephone Encounter (Signed)
Pt aware per [place]

## 2018-10-25 ENCOUNTER — Other Ambulatory Visit: Payer: Self-pay | Admitting: *Deleted

## 2018-10-25 NOTE — Patient Outreach (Signed)
[place] [facility]) Care Management  10/25/2018  [name] Feb 12, 1951 [id number]   EMMI- Heart failure  RED ON EMMI ALERT Day #22 Date:Tueesday 10/23/2018 1007 Red Alert Reason Questions about meds? Yes   Insurance:Medicare [facility] visitsx 3in the last 6 months    Outreach attempt #1 successful with patient Patient is able to verify HIPAA [name] Care Management RN reviewed and addressed red alertwith patient   EMMI [name] reports that she is doing well and the EMMI answers are incorrect She denies questions about medications  She did call in her BP values and did get a response to restart her Hydrazaline at 10 mg She is still recording her BPs   She states her other medical issues at this time is related to an unhealed area on her heel that continues to be irritated with her walking She reports she has tried peroxide, neosporin and a gold ointment without success Cm discussed duoderm or other protective dressing She voiced appreciation  Appointments 11/16/2018 f/u with Josie Saunders MD 12/13/2018 f/u with cardiologist  Consent: [facility] RN CM reviewed [facility] services with patient. Patient gave verbal consent for services.   Advised patient that there will be further automated EMMI-post discharge calls to assess how the patient is doing following the recent hospitalization Advised the patient that another call may be received from a nurse if any of their responses were abnormal. Patient voiced understanding and was appreciative of f/u call.   Plan: [facility] RN CM will close case at this time as patient has been assessed and no needs identified/needs resolved.   Pt encouraged to return a call to [facility] RN CM prn   Frona Yost L. Lavina Hamman, RN, BSN, [place] Coordinator Office number [phone] Mobile number [phone]  Main [facility] number [phone] Fax number [phone]

## 2018-10-29 ENCOUNTER — Other Ambulatory Visit: Payer: Self-pay | Admitting: *Deleted

## 2018-10-29 ENCOUNTER — Telehealth: Payer: Self-pay | Admitting: Family Medicine

## 2018-10-29 MED ORDER — DOXYCYCLINE HYCLATE 100 MG PO TABS: 100.0000 mg | ORAL_TABLET | Freq: Two times a day (BID) | ORAL | 0 refills | Status: DC

## 2018-10-29 NOTE — Telephone Encounter (Signed)
Patient aware.

## 2018-10-29 NOTE — Telephone Encounter (Signed)
Patient is concerned because she is diabetic and has a red lesion on her foot and a cracked heel that she can not get to heal.  She reports Dr. Laurance Flatten has prescribed Doxcycline in the past for her and is wondering if this can be done again.  She is concerned about coming in to be seen right now.  Her pharmacy is [facility].

## 2018-10-29 NOTE — Telephone Encounter (Signed)
doxcycline sent to pharmacy- if wound doe snot heal then NTBS as soon as possible.

## 2018-10-29 NOTE — Patient Outreach (Signed)
[place] [facility]) Care Management  10/29/2018  [name] 04-Aug-1951 [id number]   EMMI- Heart failure, RED ON EMMI ALERT Day # 26 & 27  Date: Saturday 10/17/18 1003  Red Alert Reason New/worsening problems? Yes  and Sunday October 28 2018 1003  Red Alert Reason: Weight (lbs)201 was 199  Insurance: Medicare   Outreach attempt # 1  Patient is able to verify HIPAA [facility] Care Management RN reviewed and addressed red alert with patient  [name] denies any new/worsening problems she report her wt is back to 200 she is aware of how to manage her diuretic She denies any medical concerns at this time  Consent: [facility] RN CM reviewed [facility] services with patient. Patient gave verbal consent for services.   Advised patient that there will be further automated EMMI- post discharge calls to assess how the patient is doing following the recent hospitalization Advised the patient that another call may be received from a nurse if any of their responses were abnormal. Patient voiced understanding and was appreciative of f/u call.   Plan: [facility] RN CM will close case at this time as patient has been assessed and no needs identified/needs resolved.   Pt encouraged to return a call to [facility] RN CM prn   Kimberly L. Lavina Hamman, RN, BSN, [place] Coordinator Office number [phone] Mobile number [phone]  Main [facility] number [phone] Fax number [phone]

## 2018-10-29 NOTE — Telephone Encounter (Signed)
PT is wanting to speak to [facility] about a spot on her foot that she thinks may need an antibiotic.

## 2018-10-30 DIAGNOSIS — I130 Hypertensive heart and chronic kidney disease with heart failure and stage 1 through stage 4 chronic kidney disease, or unspecified chronic kidney disease: Secondary | ICD-10-CM | POA: Diagnosis not present

## 2018-10-30 DIAGNOSIS — N183 Chronic kidney disease, stage 3 (moderate): Secondary | ICD-10-CM | POA: Diagnosis not present

## 2018-10-30 DIAGNOSIS — I5033 Acute on chronic diastolic (congestive) heart failure: Secondary | ICD-10-CM | POA: Diagnosis not present

## 2018-10-30 DIAGNOSIS — E1122 Type 2 diabetes mellitus with diabetic chronic kidney disease: Secondary | ICD-10-CM | POA: Diagnosis not present

## 2018-10-30 DIAGNOSIS — I480 Paroxysmal atrial fibrillation: Secondary | ICD-10-CM | POA: Diagnosis not present

## 2018-10-30 DIAGNOSIS — I2510 Atherosclerotic heart disease of native coronary artery without angina pectoris: Secondary | ICD-10-CM | POA: Diagnosis not present

## 2018-11-05 ENCOUNTER — Other Ambulatory Visit: Payer: Self-pay | Admitting: *Deleted

## 2018-11-05 NOTE — Patient Outreach (Signed)
[place] [facility]) Care Management  11/05/2018  [name] 1951/01/27 [id number]   EMMI- Heart failure RED ON EMMI ALERT Day # 34 Date: Sunday 11/04/18 [place] Reason: Any new problems?Yes New/worsening problems?Yes Other symptoms/problems?Yes  Insurance:  Medicare  [facility] admissions  X 3  ED visits x 3  in the last 6 months    Outreach attempt # 1 successful  Patient is able to verify HIPAA [facility] Care Management RN reviewed and addressed red alert with patient  She reports doing fair She reports that her HF is doing well her wt ranges from 200-201  She reports having questions about her foot She did call her MD Dr Laurance Flatten, who called her in an antibiotic and he is to call her this week versus her going to the office related to coronavirus safety issues  She confirms cleaning the site with half peroxide, half water and applying duoderm to the site She reports adding neosporin prior to adding duoderm.  She reports her leg is less red because she has been rubbing it in lotions    Consent: [facility] RN CM reviewed [facility] services with patient. Patient gave verbal consent for services.   Advised patient that there will be further automated EMMI- post discharge calls to assess how the patient is doing following the recent hospitalization Advised the patient that another call may be received from a nurse if any of their responses were abnormal. Patient voiced understanding and was appreciative of f/u call.   Plan: [facility] RN CM will close case at this time as patient has been assessed and no needs identified/needs resolved.   Pt encouraged to return a call to [facility] RN CM prn  Keishawn Darsey L. Lavina Hamman, RN, BSN, [place] Coordinator Office number [phone] Mobile number [phone]  Main [facility] number [phone] Fax number [phone]

## 2018-11-12 ENCOUNTER — Other Ambulatory Visit: Payer: Self-pay | Admitting: *Deleted

## 2018-11-12 NOTE — Patient Outreach (Addendum)
[place] [facility]) Care Management  11/12/2018  SHELLIA HARTL 1951-05-01 [id number]      EMMI- Heart failure RED ON EMMI ALERT Day # 41 Date: Sunday 11/11/18 1000 Red Alert Reason: Weighed themselves today? No  Insurance: Medicare  [facility] admissions x  2 ED visits x 2 in the last 6 months    Outreach attempt # 1 successful  Patient is able to verify HIPAA [facility] Care Management RN reviewed and addressed red alert with patient   EMMI  Mrs Madarang reports she slept late on Sunday 11/11/18 and at the time of the EMMI call she had not weighed. She reports when she did weigh her weight was 204 lbs on Sunday 11/11/18 She also reports some pain on her right side of her back and shoulder She believes is either related to exercise, indigestion or constipation She has taken her miralax with hopes of resolving her elimination issues and decreasing her weight value. She also will be called by her MD on 11/16/18 and will discuss her right back pain with Dr Laurance Flatten. She is using methods like heat and tylenol for this pain. She was reminded of the CHF s/s to report to her MD.      She also discussed with CM that she is noting a pattern related to her BP with elevations (SBP 160-170s) in the mornings only and then a decrease of BP (SBP 117-122 which she reports is low for her) after she has taken her BP medicine with some queasiness of her stomach and "not feeling good and seeing stars"  She reports the BP then throughout the day remains WNL. She reports believing she forgot to take her BP medicine one night only.  She reports the low BP is not occurring when she is standing but sitting down in her chair CM discussed the s/s of hypotension, fall precautions and encouraged her to take her night HTN medication as ordered. She continues to document her BPs to provide to her MD weekly.    Example on today 11/12/18 She reports a BP of 165/62 after getting up. She then reports she took her BP medicine  and later found her BP to be 122/52 as she began to experience the previously mentioned s/s.  She confirms she is taking her Hydralazine at 10 mg tid on today.   She reported concern with seeing her cardiologist on December 13 2018 as she thought the MD office was close to the COVID-19 testing site in [place]. [facility] RN CM discussed that the heart and vascular center is not the same building as the building used for the COVID-19 testing CM discussed the standard COVID-19 screening/questionaire process used to be checked in for any medical appointment. She voiced understanding.  The ucer on her foot continues to get better but she states she is noting pain and "feeling in my foot" She discussed previous concerns with neuropathy hx of left great toe amputee. She continues to keep it clean and uses duoderm for healing and protection during the day when she is active  Consent: [facility] RN CM reviewed [facility] services with patient. Patient gave verbal consent for services [facility] telephonic RN CM.   Advised patient that there will be further automated EMMI- post discharge calls to assess how the patient is doing following the recent hospitalization Advised the patient that another call may be received from a nurse if any of their responses were abnormal. Patient voiced understanding and was appreciative of f/u call.  Plan: [facility] RN CM will close case at this time as patient has been assessed and no needs identified/needs resolved.   Pt encouraged to return a call to [facility] RN CM prn  Routed note to MDs   Joelene Millin L. Lavina Hamman, RN, BSN, [place] Coordinator Office number [phone] Mobile number [phone]  Main [facility] number [phone] Fax number [phone]

## 2018-11-14 ENCOUNTER — Other Ambulatory Visit: Payer: Self-pay | Admitting: *Deleted

## 2018-11-14 NOTE — Patient Outreach (Addendum)
[facility]) Care Management  11/14/2018  Nohealani AISSATOU FRONCZAK 1951-06-01 [id number]   EMMI- Heart failure, RED ON EMMI ALERT Day # 43 Date: Tuesday 11/13/18 1000 Red Alert Reason: Weight (lbs) 205 lbs  was 203 on 11/12/18 and 201 lbs on 11/10/18   Insurance: [employer] [facility] admissions x 2 ED visits x 2 in the last 6 months  Last admission was 09/19/18 to 9/92/42 for diastolic CHF, AKI on CKD 3 Her discharge was 7 1/2 weeks ago  Outreach attempt #1 successful  Patient is able to verify HIPAA [facility] Care Management RN reviewed and addressed red alert with patient   EMMI:  Mrs Gutt confirms her wt of 205 lbs on 11/13/18  She is not sure of the reason for the increase in weight but states she is still having a pain in her chest and back. She reports increased flatus and a stool she had this morning as a result of her use of Miralax. Mrs Vanrossum is looking forward to the tele med call from Dr Laurance Flatten on 11/16/18 to review her increase in wt, back and chest pain, flatus and changes in BP values. CM discussed that the 11/12/18 [facility] RN CM note was sent to Dr Laurance Flatten reviewing her concerns also.  CM discussed the importance of reporting CHF s/s including the wt gain of 4 lbs in a week, chest discomfort, and queasy feeling with a decrease in her BP.   CM discussed the EMMI CHF program 45 days of follow up after discharge. Mrs Argueta voiced understanding. [facility] RN CM discussed other [facility] services available to include [facility] health coach. Mrs Velasques agrees to a referral to [facility] health coach for further evaluation and education for CHF, DM, atrial fibrillation and HTN after the [facility] program completion. During the CHF EMMI program Mrs Ke has had to be contacted 1-3 times each week for EMMI alerts related to CHF s/s and questions/New/worsening problems   CM assessed and discussed some possible causes of a higher BP in the mornings to include caffeine/coffee, tobacco, missed HTN  medicines and sleep apnea. Mrs Gladman confirms she has been drinking "sometimes sun drop or diet Pepsi" She also reports that she had decaffeinated coffee with hazelnut creamer this morning. She previously informed CM she believed she had not taken her HTN medicine one night   She reports a 11/16/18 Podiatry appointment in [place] but reports she is noting some healing of her heal decubitus  Social: Mrs Soria lives with her husband and has support of her daughters x 3 and grandchildren She is independent with all her care needs and denies issues with transportation to medical appointments    Conditions CHF,  DM type 2, atrial fibrillation, CKD stage 3, AS s/p TAVR, HTN, CAD, TIA, severe aortic stenosis, stroke, sleep apnea, anasarca, HDL, morbid obesity  Vertigo hx of gastric bypass, left great toe amputee    DME Eyeglasses, scales cbg meter BP cuff     Consent: [facility] RN CM reviewed THN services with patient. Patient gave verbal consent for services [facility] telephonic RN CM.   Advised patient that there will be further automated EMMI- post discharge calls to assess how the patient is doing following the recent hospitalization Advised the patient that another call may be received from a nurse if any of their responses were abnormal. Patient voiced understanding and was appreciative of f/u call.   Plan: [facility] RN CM will refer Mrs Ruis to [facility] health coach  for further evaluation and education for her chronic medical conditions CHF, Atrial fibrillation, HTN, and DM per Mrs Binette's permission Again [facility] RN CM provided CM's contact number to Mrs Shook as she reports she may have discarded CM's letters sent to her  Routed note to MDs and sent letter to Dr Sofie Rower L. Lavina Hamman, RN, BSN, [place] Coordinator Office number [phone] Mobile number [phone]  Main [facility]N number [phone] Fax number [phone]

## 2018-11-15 ENCOUNTER — Other Ambulatory Visit: Payer: Self-pay | Admitting: *Deleted

## 2018-11-15 ENCOUNTER — Telehealth: Payer: Self-pay | Admitting: *Deleted

## 2018-11-15 NOTE — Telephone Encounter (Signed)
May need to increase torsemide from 20 twice a day to 3 times a day based on weight gain from the previous day.  I would only do this increase on an as needed basis along with any increased symptoms of shortness of breath.  We can discuss that at her visit.  She should continue to watch her sodium intake very closely.  She should wear support hose and prop her legs up when she is sitting during the day higher than the level of her heart for at least 20 minutes.

## 2018-11-15 NOTE — Patient Outreach (Signed)
[facility]) Care Management  11/15/2018  [name] Jun 07, 1951 [id number]   EMMI- Heart failure, RED ON EMMI ALERT Day # 44 Date: Wednesday 11/14/18 1000 Red Alert Reason: Weight (lbs) 205 205 on 11/12/18, 203 on 11/10/18, 201 on 11/10/18, 201 on 11/09/18 & 11/08/18, 200 on 11/07/18  Any new problems? No New/worsening problems? Yes New swelling? Yes   Insurance: [employer] [facility] admissions x 2 ED visits x 2 in the last 6 months  Last admission was 09/19/18 to 12/17/37 for diastolic CHF, AKI on CKD 3 Her discharge was 7 1/2 weeks ago    EMMI:  [facility] RN CM spoke with [name] about this increase in weight, swelling and other concerns during the call to her on 11/14/18 after 1330    [name] confirmed her wt of 205 lbs on 11/13/18 and 11/14/18   She and CM discussed her wt, CHF s/s and action plan plus other concerns with her BP, possible reasons for her increase BP in the mornings and heel decubitus.  CM discussed that the 11/12/18 [facility] RN CM note was sent to Dr Laurance Flatten reviewing her concerns also.  CM discussed the importance of her reporting CHF s/s including the wt gain of 4 lbs in a week, chest discomfort, swelling and queasy feeling with a decrease in her BP.   CM discussed the EMMI CHF program 45 days of follow up after discharge. [name] voiced understanding. [facility] RN CM discussed other THN services available to include [facility] health coach. [name] agrees to a referral to [facility] health coach for further evaluation and education for CHF, DM, atrial fibrillation and HTN after the [facility] program completion. During the CHF EMMI program [name] has had to be contacted 1-3 times each week for EMMI alerts related to CHF s/s and questions/New/worsening problems   [facility] RN CM sent an Epic in basket message to Colon Flattery, embedded [facility] RN and updated Roselyn Reef, Dr Tawanna Sat nurse about concerns for [name] in anticipation of a tele assessment MD call on  11/16/18 from Dr Laurance Flatten.    Plan: [facility] RN CM referred [name] to [facility] health coach for further evaluation and education for her chronic medical conditions CHF, Atrial fibrillation, HTN, and DM per [name] permission on 11/14/18 (although she voices knowledge of care for her chronic diseases but with some noncompliance)   Routed note to MD   [place]. Alison Hamman, RN, BSN, [place] Coordinator Office number [phone] Mobile number [phone]  Main [facility]N number [phone] Fax number [phone]

## 2018-11-15 NOTE — Telephone Encounter (Signed)
FYI:  Maudie Mercury has been calling and following pt for 45 days now (they stop at 45 days) for CHF.  She will now be given a "Comptroller".   Today is her last visit/call. Her weight as of yesterday evening was up. She typically runs around 199-200lb and after 1 pm yesterday Maudie Mercury got word that pts weight was 205lb.  She states that she has some swelling and has a TELE visit with Dr Laurance Flatten on 11/16/18. No SOB as of yesterday, per kim.   Pt has also mentioned to Maudie Mercury - that her BP is high every AM and that she feels queasy at times.  Called pt today 11/15/18 at 11:26 am Weight now 204 lb Not much swelling in feet / ankles, belly may be a little swelled.  No SOB today Sees podiatry in the AM of 11/16/18.  Please make sure we discuss this at [facility] visit tomorrow.

## 2018-11-15 NOTE — Telephone Encounter (Signed)
Note printed and will discuss at [facility] visit tomorrow

## 2018-11-16 ENCOUNTER — Other Ambulatory Visit: Payer: Self-pay

## 2018-11-16 ENCOUNTER — Ambulatory Visit (INDEPENDENT_AMBULATORY_CARE_PROVIDER_SITE_OTHER): Payer: Medicare Other | Admitting: Family Medicine

## 2018-11-16 DIAGNOSIS — I2510 Atherosclerotic heart disease of native coronary artery without angina pectoris: Secondary | ICD-10-CM

## 2018-11-16 DIAGNOSIS — I10 Essential (primary) hypertension: Secondary | ICD-10-CM | POA: Diagnosis not present

## 2018-11-16 DIAGNOSIS — E1169 Type 2 diabetes mellitus with other specified complication: Secondary | ICD-10-CM | POA: Diagnosis not present

## 2018-11-16 DIAGNOSIS — B351 Tinea unguium: Secondary | ICD-10-CM | POA: Diagnosis not present

## 2018-11-16 DIAGNOSIS — E1142 Type 2 diabetes mellitus with diabetic polyneuropathy: Secondary | ICD-10-CM | POA: Diagnosis not present

## 2018-11-16 DIAGNOSIS — N183 Chronic kidney disease, stage 3 unspecified: Secondary | ICD-10-CM

## 2018-11-16 DIAGNOSIS — I4891 Unspecified atrial fibrillation: Secondary | ICD-10-CM | POA: Diagnosis not present

## 2018-11-16 DIAGNOSIS — E1122 Type 2 diabetes mellitus with diabetic chronic kidney disease: Secondary | ICD-10-CM | POA: Diagnosis not present

## 2018-11-16 DIAGNOSIS — E1151 Type 2 diabetes mellitus with diabetic peripheral angiopathy without gangrene: Secondary | ICD-10-CM

## 2018-11-16 DIAGNOSIS — M24571 Contracture, right ankle: Secondary | ICD-10-CM | POA: Diagnosis not present

## 2018-11-16 DIAGNOSIS — L97412 Non-pressure chronic ulcer of right heel and midfoot with fat layer exposed: Secondary | ICD-10-CM | POA: Diagnosis not present

## 2018-11-16 DIAGNOSIS — I350 Nonrheumatic aortic (valve) stenosis: Secondary | ICD-10-CM

## 2018-11-16 DIAGNOSIS — E7800 Pure hypercholesterolemia, unspecified: Secondary | ICD-10-CM

## 2018-11-16 DIAGNOSIS — M24572 Contracture, left ankle: Secondary | ICD-10-CM | POA: Diagnosis not present

## 2018-11-16 DIAGNOSIS — I739 Peripheral vascular disease, unspecified: Secondary | ICD-10-CM

## 2018-11-16 NOTE — Patient Instructions (Signed)
Follow-up with cardiology as planned Follow-up with podiatry as planned Follow-up with vascular surgery as planned

## 2018-11-16 NOTE — Progress Notes (Addendum)
Virtual Visit via telephone Note I connected with@ on 11/16/18 by telephone and verified that I am speaking with the correct person or authorized healthcare agent using two identifiers. [name] is currently located at home and there are no unauthorized people in close proximity. I, Redge Gainer, MD, completed this visit while in a private location in my  office.  This visit type was conducted due to national recommendations for restrictions regarding the COVID-19 Pandemic (e.g. social distancing).  This format is felt to be most appropriate for this patient at this time.  All issues noted in this document were discussed and addressed.  No physical exam was performed.    I discussed the limitations, risks, security and privacy concerns of performing an evaluation and management service by telephone and the availability of in person appointments. I also discussed with the patient that there may be a patient responsible charge related to this service. The patient expressed understanding and agreed to proceed.   Date:  11/16/2018    ID:  [name]      1951/06/06        [id number]   Patient Care Team Patient Care Team: Chipper Herb, MD as PCP - General (Family Medicine) Minus Breeding, MD as PCP - Cardiology (Cardiology) Melida Quitter, MD as Referring Physician (Otolaryngology) Alphonsa Overall, MD as Consulting Physician (General Surgery) Minus Breeding, MD as Consulting Physician (Cardiology) Williams Che, MD (Inactive) as Consulting Physician (Ophthalmology) Woodroe Mode, MD as Consulting Physician (Obstetrics and Gynecology) Ladene Artist, MD as Consulting Physician (Gastroenterology) Manuela Neptune, DPM as Referring Physician (Podiatry) Larey Dresser, MD as Consulting Physician (Cardiology) Lazaro Arms, RN as [facility]  Reason for Visit: Primary Care Follow-up     History of Present Illness & Review of Systems:      [name] is a 68 y.o. year old female primary care patient that presents today for a telehealth visit.  The patient is pleasant and doing well.  She just returned this morning from a visit to the foot doctor regarding a sore on her right heel.  The foot doctor thinks this is a pressure sore.  She did take a round of antibiotics but this has been completed and he did not refill that.  She is continue to treat this at home with the help of her husband.  She continues to follow-up with a cardiologist and has plans to follow-up with Dr. Aundra Dubin the end of the month and will be seeing her regular doctor Dr. Percival Spanish again soon.  She has a follow-up visit with the podiatrist also in May.  She denies any chest pain pressure tightness or shortness of breath anymore than usual.  She is trying to track her weights daily and has had some extra weight gain and we discussed managing this by taking an extra half of the Demadex in the morning if the weight gets close to 205 pounds versus 200 pounds.  She will continue to watch her sodium intake.  She denies any trouble with swallowing including nausea vomiting diarrhea blood in the stool or black tarry bowel movements or change in bowel habits.  Maybe a little bit of constipation but she is taking some over-the-counter medicine for this.  She denies any trouble with passing her water.  She does not need any refills on her current medicines.  She did mention that instead of taking Apresoline 25 3 times a day  that she is only taking 10 3 times a day currently.  She continues to monitor blood sugars regularly. Allergies and medications were reviewed.  She does not need any refills. Her last creatinine was 3.0.  She will need to have another BMP at some point in the next month.  Maybe this can be drawn by her visit to the cardiologist.+++  Review of systems as stated otherwise negative for body systems left unmentioned.   The patient does not have symptoms concerning  for COVID-19 infection (fever, chills, cough, or new shortness of breath).      Current Medications (Verified) Allergies as of 11/16/2018      Reactions   Amlodipine Swelling   Ankle swelling   Atorvastatin Other (See Comments)   Myalgias   Crestor [rosuvastatin Calcium] Other (See Comments)   Stiffness and back pain   Ezetimibe-simvastatin Other (See Comments)   Leg cramps   Statins Other (See Comments)   Myalgias   Celebrex [celecoxib] Rash   Levemir [insulin Detemir] Rash   Penicillins Other (See Comments)   SYNCOPE (Tolerated Ancef) Has patient had a PCN reaction causing immediate rash, facial/tongue/throat swelling, SOB or lightheadedness with hypotension: Yes Has patient had a PCN reaction causing severe rash involving mucus membranes or skin necrosis: No Has patient had a PCN reaction that required hospitalization No Has patient had a PCN reaction occurring within the last 10 years: No If all of the above answers are "NO", then may proceed with Cephalosporin use.      Medication List       Accurate as of November 16, 2018 11:47 AM. Always use your most recent med list.        Accu-Chek Aviva Plus test strip Generic drug:  glucose blood USE TO CHECK BLOOD SUGAR UP TO TWICE DAILY OR AS INSTRUCTED   apixaban 5 MG Tabs tablet Commonly known as:  ELIQUIS Take 1 tablet (5 mg total) by mouth 2 (two) times daily.   B-D INS SYR ULTRAFINE 1CC/31G 31G X 5/16" 1 ML Misc Generic drug:  Insulin Syringe-Needle U-100 USE TO INJECT INSULIN TWICE A DAY AS INSTRUCTED   Calcium Carbonate-Vitamin D 600-400 MG-UNIT tablet Commonly known as:  Calcium 600+D Take 1 tablet by mouth 2 (two) times daily.   carvedilol 25 MG tablet Commonly known as:  COREG Take 1 tablet (25 mg total) by mouth 2 (two) times daily.   clindamycin 300 MG capsule Commonly known as:  Cleocin Take 2 tablets (600 mg) one hour prior to dental visits.   doxycycline 100 MG tablet Commonly known as:  VIBRA-TABS  Take 1 tablet (100 mg total) by mouth 2 (two) times daily. 1 po bid   hydrALAZINE 25 MG tablet Commonly known as:  APRESOLINE Take 1 tablet (25 mg total) by mouth 3 (three) times daily.   Insulin Pen Needle 32G X 4 MM Misc Use to inject insulin with insulin pen   Livalo 4 MG Tabs Generic drug:  Pitavastatin Calcium TAKE 1 TABLET BY MOUTH ONCE DAILY   meclizine 12.5 MG tablet Commonly known as:  ANTIVERT Take 12.5 mg by mouth 3 (three) times daily as needed for dizziness.   mupirocin ointment 2 % Commonly known as:  Bactroban Apply 1 application topically 2 (two) times daily.   NovoLIN 70/30 ReliOn (70-30) 100 UNIT/ML injection Generic drug:  insulin NPH-regular Human Inject 15-65 Units into the skin See admin instructions. Taking 65 units with breakfast and 20 units at lunch and 65 units with  dinner.   ondansetron 4 MG disintegrating tablet Commonly known as:  Zofran ODT Take 1 tablet (4 mg total) by mouth every 8 (eight) hours as needed for nausea.   prenatal multivitamin Tabs tablet Take 1 tablet by mouth 3 (three) times a week.   sodium chloride 0.65 % Soln nasal spray Commonly known as:  OCEAN Place 1 spray into both nostrils as needed for congestion.   Systane Complete 0.6 % Soln Generic drug:  Propylene Glycol Apply 1 drop to eye daily as needed (dry eyes).   torsemide 20 MG tablet Commonly known as:  DEMADEX Take 20 mg by mouth 2 (two) times daily.   Vitamin D3 50 MCG (2000 UT) Tabs Take 2,000 Units by mouth daily.   vitamin E 400 UNIT capsule Take 400 Units by mouth 3 (three) times a week.           Allergies (Verified)    Amlodipine; Atorvastatin; Crestor [rosuvastatin calcium]; Ezetimibe-simvastatin; Statins; Celebrex [celecoxib]; Levemir [insulin detemir]; and Penicillins  Past Medical History Past Medical History:  Diagnosis Date  . Atrial fibrillation ([place] [facility])    a. maintaining sinus s/p DCCV, on Eliquis  . CAD (coronary artery disease)    a.  Nonobstructive by cath 2006 - 25% LAD, 25-30% after 1st diag, distal 25% PDA, minor irreg LCx. b. Normal nuc 2011.  . CKD (chronic kidney disease), stage III ([place])   . Cystocele   . Difficult intubation 09-07-2012   big neck trouble with intubation parotid surgery"takes little med to sedate"  . Esophageal stricture   . GERD (gastroesophageal reflux disease)   . Hiatal hernia    a. 07/2013: HH with small stricture holding up barium tablet (concides with patient's sx of food sticking).  . History of kidney stones   . Hyperlipidemia   . Hypertension   . IBS (irritable bowel syndrome)   . Memory difficulty 11/26/2015  . Morbid obesity ([place])    a. hx of gastric bypass (sleeve gastrectomy 2015)  . Osteoporosis   . 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  [place] [facility] Pericardial effusion    a. Mod by echo 2012 at time of PNA. b. Echo 07/2012: small pericardial effusion vs fat.  . S/P TAVR (transcatheter aortic valve replacement)   . Severe aortic stenosis   . Sleep apnea     "have mask; don't use it" (02/14/2018)  . TIA (transient ischemic attack) 10/2014; 06/2016   "some memory issues since; daughter says my speech is sometimes different" (02/14/2018)  . Type II diabetes mellitus ([place])     Past Surgical History:  Procedure Laterality Date  . BREATH TEK H PYLORI N/A 07/29/2013   Procedure: [place];  Surgeon: Shann Medal, MD;  Location: [facility];  Service: General;  Laterality: N/A;  . CARDIAC CATHETERIZATION  2006  . CARDIOVERSION N/A 02/16/2018   Procedure: CARDIOVERSION;  Surgeon: Pixie Casino, MD;  Location: [facility];  Service: Cardiovascular;  Laterality: N/A;  . CARPAL TUNNEL RELEASE Left 2011  . CATARACT EXTRACTION W/ INTRAOCULAR LENS  IMPLANT, BILATERAL Bilateral 2003   left  . CHOLECYSTECTOMY OPEN  1982  . COLONOSCOPY N/A 07/14/2015   Procedure: COLONOSCOPY;  Surgeon: Ladene Artist, MD;  Location: [facility];  Service: Endoscopy;  Laterality:  N/A;  . ESOPHAGOGASTRODUODENOSCOPY N/A 12/27/2013   Procedure: ESOPHAGOGASTRODUODENOSCOPY (EGD);  Surgeon: Shann Medal, MD;  Location: [facility];  Service: General;  Laterality: N/A;  . ESOPHAGOGASTRODUODENOSCOPY (EGD) WITH ESOPHAGEAL DILATION    .  INTRAOPERATIVE TRANSTHORACIC ECHOCARDIOGRAM  05/08/2018   Procedure: INTRAOPERATIVE TRANSTHORACIC ECHOCARDIOGRAM;  Surgeon: Burnell Blanks, MD;  Location: [facility];  Service: Open Heart Surgery;;  . LAPAROSCOPIC GASTRIC SLEEVE RESECTION N/A 01/20/2014   Procedure: LAPAROSCOPIC GASTRIC SLEEVE RESECTION;  Surgeon: Shann Medal, MD;  Location: [facility];  Service: General;  Laterality: N/A;  . PAROTIDECTOMY  09/07/2012   Procedure: PAROTIDECTOMY;  Surgeon: Melida Quitter, MD;  Location: [facility];  Service: ENT;  Laterality: Left;  LEFT PAROTIDECTOMY  . RIGHT HEART CATH N/A 09/25/2018   Procedure: RIGHT HEART CATH;  Surgeon: Larey Dresser, MD;  Location: [facility];  Service: Cardiovascular;  Laterality: N/A;  . RIGHT/LEFT HEART CATH AND CORONARY ANGIOGRAPHY N/A 03/29/2018   Procedure: RIGHT/LEFT HEART CATH AND CORONARY ANGIOGRAPHY;  Surgeon: Burnell Blanks, MD;  Location: [facility];  Service: Cardiovascular;  Laterality: N/A;  . TEE WITHOUT CARDIOVERSION N/A 02/16/2018   Procedure: TRANSESOPHAGEAL ECHOCARDIOGRAM (TEE);  Surgeon: Pixie Casino, MD;  Location[facility];  Service: Cardiovascular;  Laterality: N/A;  . TOE AMPUTATION Left 07/2017   great toe; [facility]   . TONSILLECTOMY  1968  . TRANSCATHETER AORTIC VALVE REPLACEMENT, TRANSFEMORAL  05/08/2018   Transcatheter Aortic Valve Replacement   . TRANSCATHETER AORTIC VALVE REPLACEMENT, TRANSFEMORAL N/A 05/08/2018   Procedure: TRANSCATHETER AORTIC VALVE REPLACEMENT, TRANSFEMORAL;  Surgeon: Burnell Blanks, MD;  Location: [facility];  Service: Open Heart Surgery;  Laterality: N/A;  . TUBAL LIGATION  yrs ago  . UPPER GI ENDOSCOPY  01/20/2014   Procedure: UPPER GI  ENDOSCOPY;  Surgeon: Shann Medal, MD;  Location: [facility];  Service: General;;    Social History   Socioeconomic History  . Marital status: Married    Spouse name: Not on file  . Number of children: 3  . Years of education: some college  . Highest education level: Some college, no degree  Occupational History  . Occupation: retired    Fish farm manager: [employer]  . Financial resource strain: Not hard at all  . Food insecurity:    Worry: Never true    Inability: Never true  . Transportation needs:    Medical: No    Non-medical: No  Tobacco Use  . Smoking status: Never Smoker  . Smokeless tobacco: Never Used  Substance and Sexual Activity  . Alcohol use: Yes    Alcohol/week: 0.0 standard drinks    Comment:  "mixed drink q few years"  . Drug use: Never  . Sexual activity: Yes    Birth control/protection: Post-menopausal  Lifestyle  . Physical activity:    Days per week: 0 days    Minutes per session: 0 min  . Stress: Only a little  Relationships  . Social connections:    Talks on phone: More than three times a week    Gets together: More than three times a week    Attends religious service: More than 4 times per year    Active member of club or organization: Yes    Attends meetings of clubs or organizations: More than 4 times per year    Relationship status: Married  Other Topics Concern  . Not on file  Social History Narrative   Married   Patient is right handed.   Patient rarely drinks caffeine.   Lives at home with husband in two story home. Husband washes laundry in the basement. They have 3 daughters and 7 grandchildren.      Family History  Problem Relation Age of Onset  . Heart failure Mother   . Hypertension Mother   . Skin cancer Mother   . Colitis Mother   . Diabetes Mother   . Kidney Stones Mother   . Stroke Mother   . Heart disease Father   . Colon polyps Father   . Diabetes Father   . Heart failure Father   . Heart attack  Sister   . Diabetes Other   . Stroke Maternal Grandfather   . Diabetes Maternal Grandfather   . Colon cancer Neg Hx        Labs/Other Tests and Data Reviewed:    Wt Readings from Last 3 Encounters:  11/12/18 203 lb (92.1 kg)  10/15/18 200 lb (90.7 kg)  10/08/18 212 lb 12.8 oz (96.5 kg)   Temp Readings from Last 3 Encounters:  10/05/18 (!) 97 F (36.1 C) (Oral)  09/28/18 98.3 F (36.8 C) (Oral)  09/05/18 (!) 96.8 F (36 C) (Oral)   BP Readings from Last 3 Encounters:  11/12/18 (!) 122/52  10/08/18 126/72  10/05/18 (!) 125/55   Pulse Readings from Last 3 Encounters:  10/08/18 60  10/05/18 (!) 55  09/28/18 63     Lab Results  Component Value Date   HGBA1C 10.3 (H) 09/19/2018   HGBA1C 10.1 (H) 06/16/2018   HGBA1C 9.6 (H) 05/04/2018   Lab Results  Component Value Date   MICROALBUR 100 02/20/2014   LDLCALC 115 (H) 06/16/2018   CREATININE 3.00 (H) 10/16/2018       Chemistry      Component Value Date/Time   NA 139 10/08/2018 1145   NA 147 (H) 09/13/2018 1415   K 4.4 10/08/2018 1145   CL 100 10/08/2018 1145   CO2 28 10/08/2018 1145   BUN 59 (H) 10/08/2018 1145   BUN 41 (H) 09/13/2018 1415   CREATININE 3.00 (H) 10/16/2018 1315   CREATININE 0.99 09/08/2014 1122      Component Value Date/Time   CALCIUM 9.0 10/08/2018 1145   ALKPHOS 57 09/19/2018 1440   AST 17 09/19/2018 1440   ALT 24 09/19/2018 1440   BILITOT 0.7 09/19/2018 1440   BILITOT 0.8 04/18/2018 0942         OBSERVATIONS/ OBJECTIVE:     The patient was alert and seemed to have a good handle on all her medical conditions and knows when her next follow-ups are due.  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  She is very conscious of taking care of both feet because of this.  She also has had trouble with her blood sugar in the past and hopefully she will continue to monitor this well with her current insulin intake using her freestyle libre and watching her diet closely.  Physical exam  deferred due to nature of telephonic visit.  ASSESSMENT & PLAN    Time:   Today, I have spent 30 minutes with the patient via telephone discussing the above including Covid precautions.     Visit Diagnoses: 1. Nonrheumatic aortic valve stenosis -Follow-up with cardiology as planned  2. Peripheral artery disease ([place]) -Follow-up with vascular surgery as planned  3. Type 2 diabetes mellitus with peripheral neuropathy ([facility]) -Continue to watch diet closely and manage diabetes prudently and check blood sugars regularly  4. Pure hypercholesterolemia -Continue with Livalo  5. Benign essential HTN -Continue with current medicines for blood pressure and that is with 10 mg of Apresoline instead of 25 currently.  6. Chronic kidney disease, stage  3 (moderate) ([facility]) -Repeat BMP at next visit with cardiology if possible  7. CKD stage 3 due to type 2 diabetes mellitus ([place]) -Follow-up with nephrology  8. Atrial fibrillation, unspecified type [facility]) -Follow-up with cardiology  9. Type II diabetes mellitus with peripheral circulatory disorder ([facility]) -Check hemoglobin A1c when convenient     Follow-up Instructions: Continue current medications. Continue good therapeutic lifestyle changes which include good diet and exercise. Fall precautions discussed with patient. If an FOBT was given today- please return it to our front desk. If you are over 62 years old - you may need Prevnar 92 or the adult Pneumonia vaccine.  **Flu shots are available--- please call and schedule a FLU-CLINIC appointment**  After your visit with [place] today you will receive a survey in the mail or online from [employer] regarding your care with [place]. Please take a moment to fill this out. Your feedback is very important to [place] as you can help [place] better understand your patient needs as well as improve your experience and satisfaction. WE CARE ABOUT YOU!!!   Patient Instructions  Follow-up with cardiology as planned  Follow-up with podiatry as planned Follow-up with vascular surgery as planned      I discussed the assessment and treatment plan with the patient. The patient was provided an opportunity to ask questions and all were answered. The patient agreed with the plan and demonstrated an understanding of the instructions.   The patient was advised to call back or seek an in-person evaluation if the symptoms worsen or if the condition fails to improve as anticipated.  The above assessment and management plan was discussed with the patient. The patient verbalized understanding of and has agreed to the management plan. Patient is aware to call the clinic if symptoms persist or worsen. Patient is aware when to return to the clinic for a follow-up visit. Patient educated on when it is appropriate to go to the emergency department.   Arrie Senate MD   Chipper Herb, MD [address], [place], [place] [address] Ph [phone]

## 2018-12-12 ENCOUNTER — Telehealth (HOSPITAL_COMMUNITY): Payer: Self-pay

## 2018-12-12 ENCOUNTER — Ambulatory Visit (HOSPITAL_COMMUNITY)
Admission: RE | Admit: 2018-12-12 | Discharge: 2018-12-12 | Disposition: A | Discharge status: Home / Self Care | Payer: Medicare Other | Source: Ambulatory Visit | Attending: Cardiology | Admitting: Cardiology

## 2018-12-12 ENCOUNTER — Other Ambulatory Visit: Payer: Self-pay

## 2018-12-12 DIAGNOSIS — N183 Chronic kidney disease, stage 3 unspecified: Secondary | ICD-10-CM

## 2018-12-12 DIAGNOSIS — I5033 Acute on chronic diastolic (congestive) heart failure: Secondary | ICD-10-CM | POA: Diagnosis not present

## 2018-12-12 DIAGNOSIS — I480 Paroxysmal atrial fibrillation: Secondary | ICD-10-CM

## 2018-12-12 DIAGNOSIS — I2510 Atherosclerotic heart disease of native coronary artery without angina pectoris: Secondary | ICD-10-CM | POA: Diagnosis not present

## 2018-12-12 DIAGNOSIS — E1122 Type 2 diabetes mellitus with diabetic chronic kidney disease: Secondary | ICD-10-CM | POA: Diagnosis not present

## 2018-12-12 MED ORDER — TORSEMIDE 20 MG PO TABS: ORAL_TABLET | ORAL | 0 refills | Status: DC

## 2018-12-12 MED ORDER — HYDRALAZINE HCL 10 MG PO TABS: ORAL_TABLET | ORAL | 0 refills | Status: DC

## 2018-12-12 NOTE — Telephone Encounter (Signed)
Reviewed AVS with pt/mailed AVS as well:  Understood med changes Mailed script for lab work as pt does not want to come to hospital Mailed [facility] enrollment form for pt to sign and mail back 2 July @1120  f/u scheduled

## 2018-12-12 NOTE — Progress Notes (Signed)
Heart Failure TeleHealth Note  Due to national recommendations of social distancing due to [employer] 19, Audio/video telehealth visit is felt to be most appropriate for this patient at this time.  See MyChart message from today for patient consent regarding telehealth for [facility].  Date:  12/12/2018   ID:  [name], DOB [birth year], MRN [id number]  Location: Home  Provider location: [facility] Type of Visit: Established patient   PCP:  Chipper Herb, MD  Cardiologist:  Minus Breeding, MD Primary HF: Dr. Aundra Dubin  Chief Complaint: Shortness of breath   History of Present Illness: [name] is a 68 y.o. female who presents via audio/video conferencing for a telehealth visit today.     she denies symptoms worrisome for COVID 19.   Patient has a history of AS s/p TAVR, chronic diastolic CHF, PAF, CKD III, HLD, HTN, OSA, and morbid obesity.   She was admitted from [facility] office 09/19/18 with marked volume overload. She diuresed 36 lbs with lasix drip and was transitioned to torsemide 40 mg daily. Course complicated by AKI. RHC completed and showed elevated filling pressures and preserved cardiac output.  PYP scan suggestive of TTR amyloid. Discharge weight was 208 pounds.   Weight has been fluctuating up and down 3-4 lbs.  She has been adding a dose of 10 mg torsemide in the middle of the day.  BP has had significant swings, can be as high as systolic 562 in the morning before her first dose of hydralazine, then will drop down to 120 after she takes hydralazine.  Mild peripheral edema.  She has an ulcer on her right heel, has been seeing podiatry for this.  Rare chest pain.  No significant dyspnea walking on flat ground but not very active.  Significant dyspnea walking up an incline or stairs.  Occasional palpitations but nothing sustained.   Labs (11/19): LDL 115 Labs (2/20): K 4.4, creatinine 2.39, myeloma panel negative  PMH: 1. Chronic  diastolic CHF: Echo (1/30) with EF 60-65%, moderate LVH, mildly dilated RV with mildly decreased systolic function, bioprosthetic aortic valve s/p TAVR appeared to function normally.  - RHC (2/20): mean RA 13, PA 50/21 mean 33, PCWP mean 25, CI 2.92, PVR 1.3 WU 2. Cardiac amyloidosis: TTR amyloidosis.   - PYP scan (2/20): grade 2, H/CL ratio 1.8 => suggestive of TTR amyloidosis.  - myeloma panel negative.  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  3. Bioprosthetic aortic valve s/p TAVR.  4. CKD: Stage 3.  5. Atrial fibrillation: Paroxysmal.  6. Hyperlipidemia 7. GERD with hiatal hernia 8. HTN 9. Obesity s/p gastric bypass in 2015.  10. Type 2 diabetes 11. OSA: Unable to tolerate CPAP.  12. CAD: LHC (8/19) with 50% proximal RCA, 50% proximal LCx, 65% mid LCx, 99% distal LAD (medical management).  13. Carotid dopplers (8/19) with mild disease.    Current Outpatient Medications  Medication Sig Dispense Refill  . ACCU-CHEK AVIVA PLUS test strip USE TO CHECK BLOOD SUGAR UP TO TWICE DAILY OR AS INSTRUCTED 100 each 2  . apixaban (ELIQUIS) 5 MG TABS tablet Take 1 tablet (5 mg total) by mouth 2 (two) times daily. 180 tablet 3  . B-D INS SYR ULTRAFINE 1CC/31G 31G X 5/16" 1 ML MISC USE TO INJECT INSULIN TWICE A DAY AS INSTRUCTED (Patient taking differently: 1 Stick by Other route 3 (three) times daily before meals. ) 100 each 2  . Calcium Carbonate-Vitamin D (CALCIUM  600+D) 600-400 MG-UNIT per tablet Take 1 tablet by mouth 2 (two) times daily. (Patient taking differently: Take 1 tablet by mouth 3 (three) times a week. )    . carvedilol (COREG) 25 MG tablet Take 1 tablet (25 mg total) by mouth 2 (two) times daily. 60 tablet 11  . Cholecalciferol (VITAMIN D3) 50 MCG (2000 UT) TABS Take 2,000 Units by mouth daily.    . clindamycin (CLEOCIN) 300 MG capsule Take 2 tablets (600 mg) one hour prior to dental visits. 6 capsule 3  . doxycycline (VIBRA-TABS) 100 MG tablet Take 1 tablet (100 mg  total) by mouth 2 (two) times daily. 1 po bid (Patient not taking: Reported on 11/16/2018) 20 tablet 0  . hydrALAZINE (APRESOLINE) 10 MG tablet Take 1 tablet (10 mg total) by mouth every morning AND 1 tablet (10 mg total) daily with lunch AND 2 tablets (20 mg total) at bedtime. 360 tablet 0  . Insulin Pen Needle 32G X 4 MM MISC Use to inject insulin with insulin pen 100 each 4  . LIVALO 4 MG TABS TAKE 1 TABLET BY MOUTH ONCE DAILY 90 tablet 1  . meclizine (ANTIVERT) 12.5 MG tablet Take 12.5 mg by mouth 3 (three) times daily as needed for dizziness.    . mupirocin ointment (BACTROBAN) 2 % Apply 1 application topically 2 (two) times daily. 22 g 0  . NOVOLIN 70/30 RELION (70-30) 100 UNIT/ML injection Inject 15-65 Units into the skin See admin instructions. Taking 65 units with breakfast and 20 units at lunch and 65 units with dinner.    . ondansetron (ZOFRAN ODT) 4 MG disintegrating tablet Take 1 tablet (4 mg total) by mouth every 8 (eight) hours as needed for nausea. 10 tablet 0  . Prenatal Vit-Fe Fumarate-FA (PRENATAL MULTIVITAMIN) TABS tablet Take 1 tablet by mouth 3 (three) times a week.    Marland Kitchen Propylene Glycol (SYSTANE COMPLETE) 0.6 % SOLN Apply 1 drop to eye daily as needed (dry eyes).     . sodium chloride (OCEAN) 0.65 % SOLN nasal spray Place 1 spray into both nostrils as needed for congestion. 15 mL 0  . torsemide (DEMADEX) 20 MG tablet Take 1.5 tablets (30 mg total) by mouth every morning AND 1 tablet (20 mg total) at bedtime. 225 tablet 0  . vitamin E 400 UNIT capsule Take 400 Units by mouth 3 (three) times a week.      No current facility-administered medications for this encounter.     Allergies:   Amlodipine; Atorvastatin; Crestor [rosuvastatin calcium]; Ezetimibe-simvastatin; Statins; Celebrex [celecoxib]; Levemir [insulin detemir]; and Penicillins   Social History:  The patient  reports that she has never smoked. She has never used smokeless tobacco. She reports current alcohol use. She  reports that she does not use drugs.   Family History:  The patient's family history includes Colitis in her mother; Colon polyps in her father; Diabetes in her father, maternal grandfather, mother, and another family member; Heart attack in her sister; Heart disease in her father; Heart failure in her father and mother; Hypertension in her mother; Kidney Stones in her mother; Skin cancer in her mother; Stroke in her maternal grandfather and mother.   ROS:  Please see the history of present illness.   All other systems are personally reviewed and negative.   Exam:  (Video/Tele Health Call; Exam is subjective and or/visual.) General:  Speaks in full sentences. No resp difficulty. Lungs: Normal respiratory effort with conversation.  Abdomen: Non-distended per patient report Extremities:  1+ ankle edema. Ulcer on right heel.  Neuro: Alert & oriented x 3.   Recent Labs: 02/14/2018: TSH 0.780 09/05/2018: BNP 116.6 09/19/2018: ALT 24 09/25/2018: Hemoglobin 11.6; Platelets 164 09/26/2018: Magnesium 2.1 10/08/2018: BUN 59; Potassium 4.4; Sodium 139 10/16/2018: Creatinine, Ser 3.00  Personally reviewed   Wt Readings from Last 3 Encounters:  11/12/18 92.1 kg (203 lb)  10/15/18 90.7 kg (200 lb)  10/08/18 96.5 kg (212 lb 12.8 oz)      ASSESSMENT AND PLAN:  1. Chronic diastolic CHF: Echo 12/3644 EF 60-65%, moderate LVH, grade 2 diastolic dysfunction, trivial mitral stenosis, mildly dilated RV with mildly decreased systolic function, TAVR valve looks ok, moderate pericardial effusion (no tamponade).PYP scan in 2/20 suggestive of transthyretin amyloidosis, genetic testing suggestive of wild type.  NYHA class II-III symptoms. Weight fairly stable, mild peripheral edema.  - I will have her take torsemide 30 qam/20 qpm rather than taking 20 bid with an extra 10 mg at mid-day.  I will arrange for BMET.   2. CKD stage 3: I will arrange to check BMET.   3. CAD: Cath in 8/19 with moderate RCA and LCx disease,  severe distal LAD disease, medical management.  No chest pain. - No ASA given apixaban use.  - Continue pitivastatin, check lipids with next labs.   4. Atrial fibrillation: Paroxysmal. Pulse has felt regular to her.  - Continue Eliquis.  5. HTN: BP very high in the morning before meds.   - Continue Coreg 25 mg BID. - She cannot take amlodipine due to peripheral edema formation.  - Hold off on ACEI/ARB with CKD.  - I will have her increase her evening hydralazine to 20 mg to try to help early am BP control (take hydralazine 05/25/19).  6. PAD: She has a right heel ulcer followed by podiatry.  - I will arrange for peripheral arterial dopplers to assess circulation.  7. 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  - I will try to arrange for her to start on tafamidis.   COVID screen The patient does not have any symptoms that suggest any further testing/ screening at this time.  Social distancing reinforced today.  Patient Risk: After full review of this patients clinical status, I feel that they are at moderate risk for cardiac decompensation at this time.  Relevant cardiac medications were reviewed at length with the patient today. The patient does not have concerns regarding their medications at this time.   Recommended follow-up:  2 months  Today, I have spent 22 minutes with the patient with telehealth technology discussing the above issues .    Signed, [name], MD  12/12/2018 11:52 PM  [place] [address] [facility] [place] [address] [phone] (office) [phone] (fax)

## 2018-12-12 NOTE — Patient Instructions (Signed)
Please sign and mail back Tafamidis form per our phone conversation, we will fax the completed paperwork for you.  INCREASE Hydralazine to 10 mg in the am, 10mg  at lunch, and 20 mg at bedtime.  Change Torsemide to 30 mg in the morning and 20 mg at bedtime.  A prescription for lab work has been mailed to you, please get these done as soon as possible.  Your physician recommends that you schedule a follow-up appointment on 2 July @11 :20  Please call the office at [phone] if you have any further questions.

## 2018-12-13 ENCOUNTER — Encounter (HOSPITAL_COMMUNITY): Payer: Medicare Other | Admitting: Cardiology

## 2018-12-13 ENCOUNTER — Other Ambulatory Visit: Payer: Self-pay

## 2018-12-13 DIAGNOSIS — E1165 Type 2 diabetes mellitus with hyperglycemia: Secondary | ICD-10-CM | POA: Diagnosis not present

## 2018-12-13 NOTE — Patient Outreach (Signed)
[facility]) Care Management  12/13/2018  [name] [birth year] [id number]  Telephone call to patient for introduction.  Patient able to verify HIPAA.  Explained to the patient health coach role and [facility] services.   Plan:  RN Health Coach will out reach the patient within 7 business day for assessment.  Lazaro Arms RN, BSN, [place] Direct Dial:  [phone]  Fax: [phone]

## 2018-12-19 ENCOUNTER — Other Ambulatory Visit: Payer: Medicare Other

## 2018-12-19 ENCOUNTER — Other Ambulatory Visit: Payer: Self-pay

## 2018-12-19 ENCOUNTER — Telehealth (HOSPITAL_COMMUNITY): Payer: Self-pay | Admitting: *Deleted

## 2018-12-19 DIAGNOSIS — I5032 Chronic diastolic (congestive) heart failure: Secondary | ICD-10-CM

## 2018-12-19 DIAGNOSIS — E785 Hyperlipidemia, unspecified: Secondary | ICD-10-CM | POA: Diagnosis not present

## 2018-12-19 NOTE — Telephone Encounter (Signed)
Pt at pcp for labs and they need orders changed to resulting agency [facility], new orders placed

## 2018-12-20 LAB — BASIC METABOLIC PANEL
BUN/Creatinine Ratio: 23 (ref 12–28)
BUN: 51 mg/dL — ABNORMAL HIGH (ref 8–27)
CO2: 29 mmol/L (ref 20–29)
Calcium: 9.7 mg/dL (ref 8.7–10.3)
Chloride: 97 mmol/L (ref 96–106)
Creatinine, Ser: 2.24 mg/dL — ABNORMAL HIGH (ref 0.57–1.00)
GFR calc Af Amer: 25 mL/min/{1.73_m2} — ABNORMAL LOW (ref >59)
GFR calc non Af Amer: 22 mL/min/{1.73_m2} — ABNORMAL LOW (ref >59)
Glucose: 278 mg/dL — ABNORMAL HIGH (ref 65–99)
Potassium: 4.4 mmol/L (ref 3.5–5.2)
Sodium: 143 mmol/L (ref 134–144)

## 2018-12-20 LAB — LIPID PANEL
Chol/HDL Ratio: 5 ratio — ABNORMAL HIGH (ref 0.0–4.4)
Cholesterol, Total: 269 mg/dL — ABNORMAL HIGH (ref 100–199)
HDL: 54 mg/dL (ref >39)
LDL Calculated: 145 mg/dL — ABNORMAL HIGH (ref 0–99)
Triglycerides: 351 mg/dL — ABNORMAL HIGH (ref 0–149)
VLDL Cholesterol Cal: 70 mg/dL — ABNORMAL HIGH (ref 5–40)

## 2018-12-21 ENCOUNTER — Telehealth (HOSPITAL_COMMUNITY): Payer: Self-pay

## 2018-12-21 ENCOUNTER — Other Ambulatory Visit: Payer: Self-pay

## 2018-12-21 DIAGNOSIS — I5032 Chronic diastolic (congestive) heart failure: Secondary | ICD-10-CM

## 2018-12-21 DIAGNOSIS — E781 Pure hyperglyceridemia: Secondary | ICD-10-CM

## 2018-12-21 MED ORDER — ICOSAPENT ETHYL 1 G PO CAPS: 2.0000 g | ORAL_CAPSULE | Freq: Two times a day (BID) | ORAL | 6 refills | Status: DC

## 2018-12-21 NOTE — Patient Outreach (Signed)
[facility]) Care Management  12/21/2018  TAYTE CHILDERS 04/16/1951 [id number]    1st unsuccessful outreach to the patient for initial assessment.  No answer.  Unable to leave a message due to no voicemail pickup.  Plan: RN Health Coach will send letter. [place] will make outreach attempt to the patient within thirty business days.   Lazaro Arms RN, BSN, [place] Direct Dial:  [phone]  Fax: [phone]

## 2018-12-21 NOTE — Telephone Encounter (Signed)
Pt aware of lab results. Referral placed for lipid clinic and pt aware to start new medicine Vascepa 2g BID.  Verbalized understanding.    Pt noted receiving enrollment forms for tafamidis.  Advised to complete patient portion, including income verification and return to office.  Verbalized understanding.

## 2018-12-21 NOTE — Telephone Encounter (Signed)
-----   Message from Larey Dresser, MD sent at 12/20/2018 10:56 PM EDT ----- LDL and triglycerides very high.  Goal LDL < 70.  If she is taking Livalo 4 mg daily, would refer her to lipid clinic to get started on Repatha.  Would suggest that she start Vascepa 2 g bid for very high triglycerides.  Renal function stable.

## 2018-12-23 IMAGING — DX DG CHEST 2V
2 series · 2 of 2 positions shown · non-contrast
Comparison: 02/29/2016

CLINICAL DATA: SOB

EXAM:
CHEST - 2 VIEW

[chest pa]
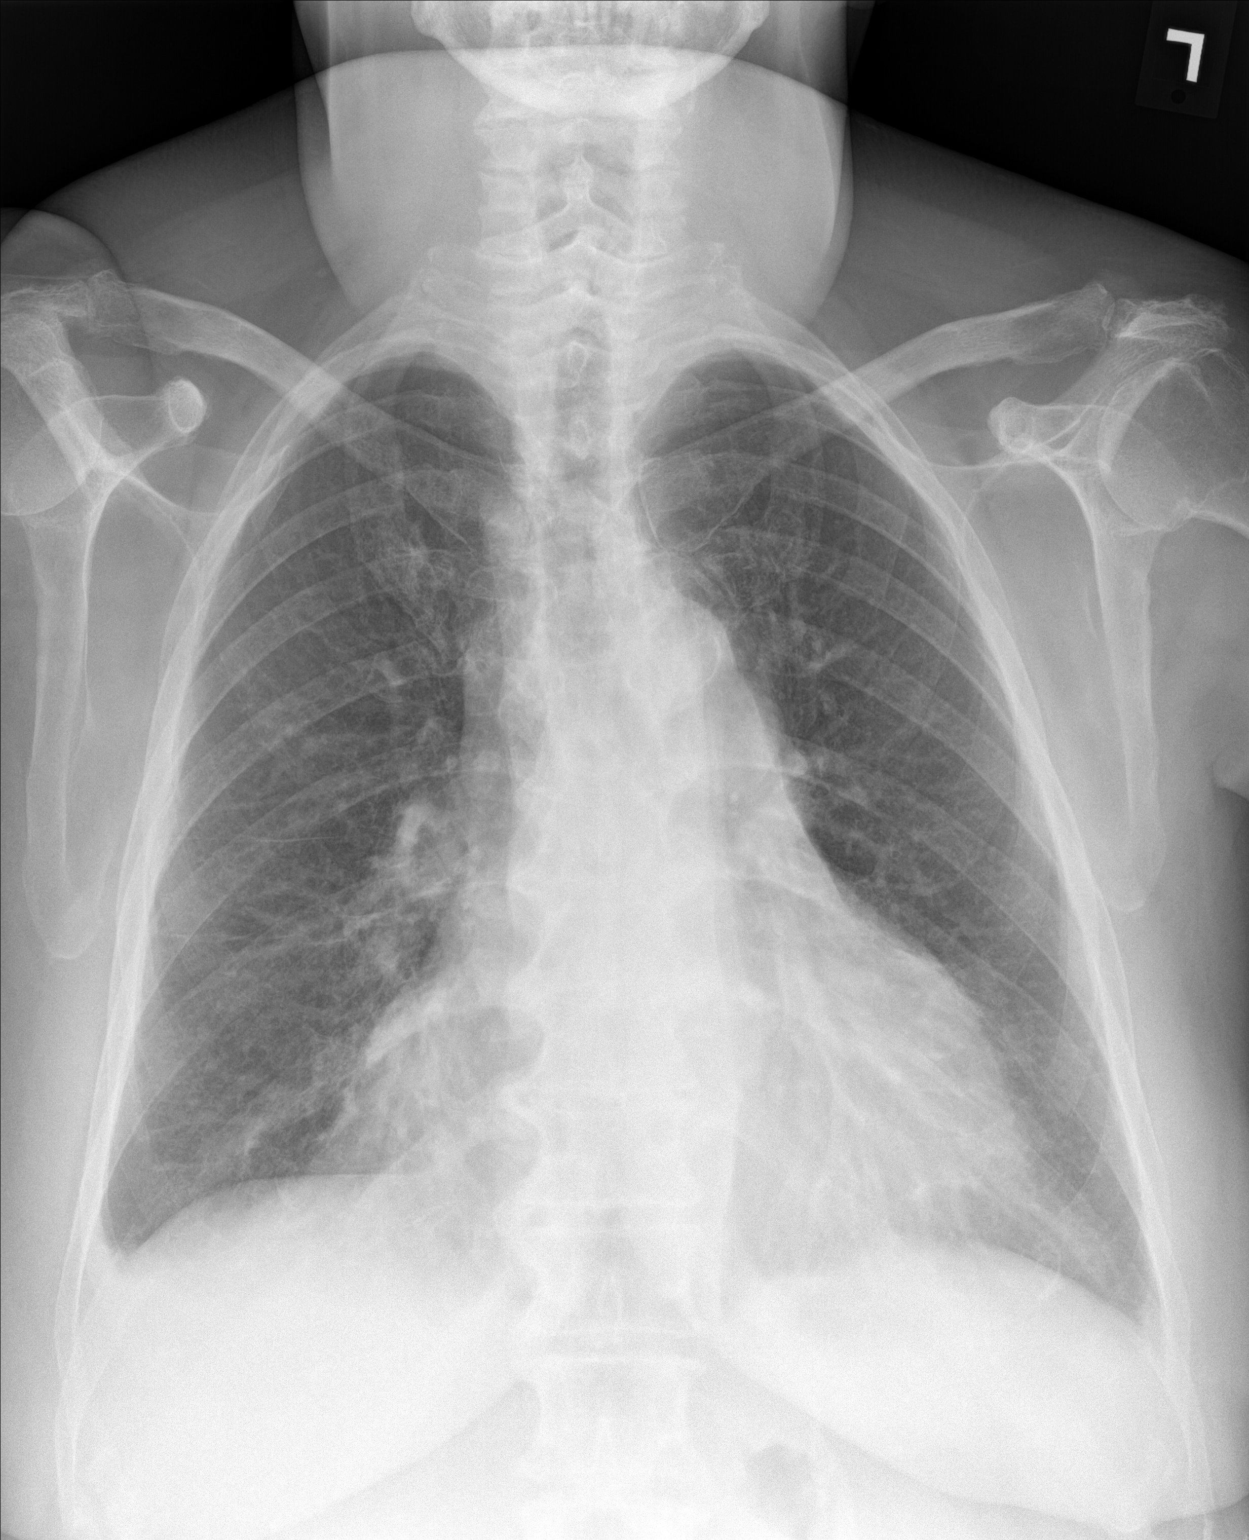

[chest lat]
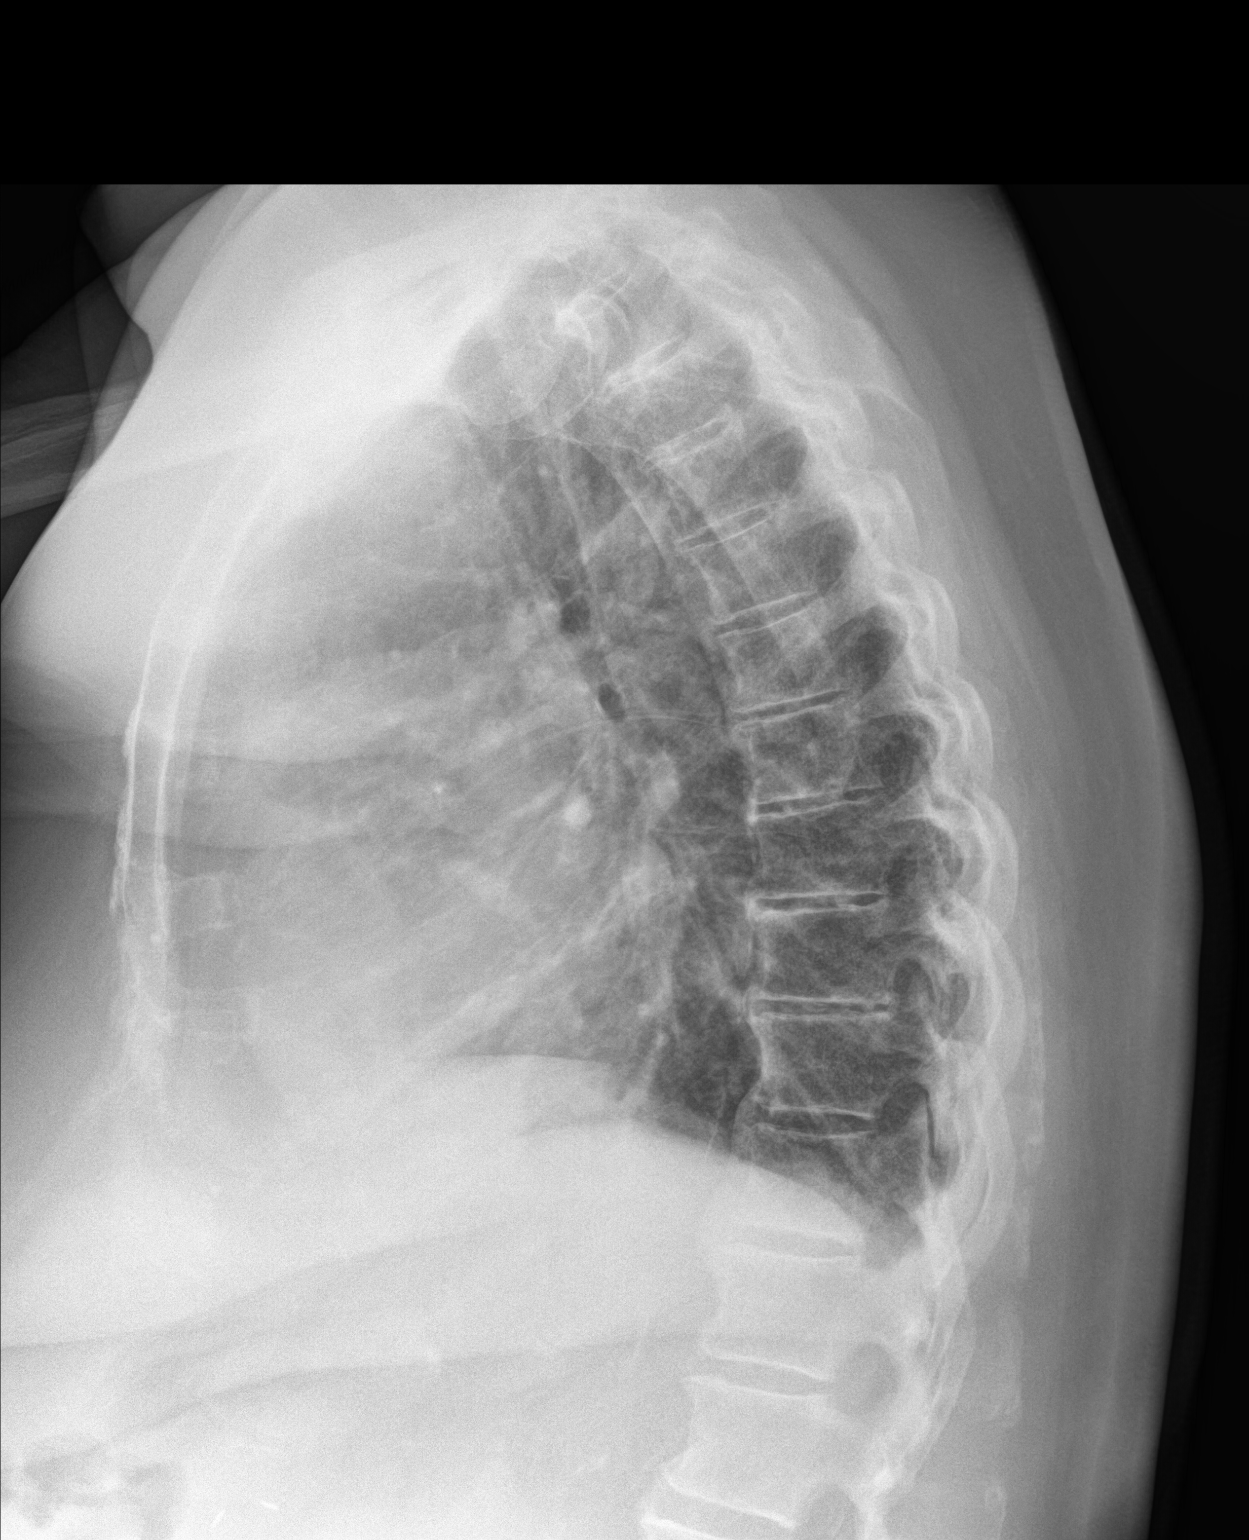

[2 of 2 positions shown; findings below may reference images not displayed]

FINDINGS: Moderate cardiomegaly.  Aortic Atherosclerosis ([id number]).

Subtle septal lines are noted peripherally in the lung bases. No
focal airspace disease. Blunting of costophrenic angles suggesting
small effusions.

No pneumothorax.

Anterior vertebral endplate spurring at multiple levels in the mid
and lower thoracic spine.
IMPRESSION: 1. Cardiomegaly with small bilateral pleural effusions and bilateral
septal lines suggesting early/mild CHF.

## 2018-12-26 ENCOUNTER — Telehealth (HOSPITAL_COMMUNITY): Payer: Self-pay | Admitting: *Deleted

## 2018-12-26 NOTE — Telephone Encounter (Signed)
Pt left VM stating she was supposed to be referred to lipid clinic but has not heard from them yet. Called patient back left detailed message that referral had been placed and due to covid 19 precautions they may be behind as in office visits are limited. I left the number to [facility] so that she could call them directly with concerns.

## 2018-12-27 DIAGNOSIS — E1142 Type 2 diabetes mellitus with diabetic polyneuropathy: Secondary | ICD-10-CM | POA: Diagnosis not present

## 2018-12-27 DIAGNOSIS — L97529 Non-pressure chronic ulcer of other part of left foot with unspecified severity: Secondary | ICD-10-CM | POA: Diagnosis not present

## 2018-12-27 DIAGNOSIS — I739 Peripheral vascular disease, unspecified: Secondary | ICD-10-CM | POA: Diagnosis not present

## 2018-12-27 DIAGNOSIS — R0989 Other specified symptoms and signs involving the circulatory and respiratory systems: Secondary | ICD-10-CM | POA: Diagnosis not present

## 2018-12-27 DIAGNOSIS — L97412 Non-pressure chronic ulcer of right heel and midfoot with fat layer exposed: Secondary | ICD-10-CM | POA: Diagnosis not present

## 2019-01-08 ENCOUNTER — Other Ambulatory Visit: Payer: Self-pay

## 2019-01-08 ENCOUNTER — Encounter (HOSPITAL_COMMUNITY): Payer: Self-pay | Admitting: Emergency Medicine

## 2019-01-08 ENCOUNTER — Emergency Department (HOSPITAL_COMMUNITY)
Admission: EM | Admit: 2019-01-08 | Discharge: 2019-01-09 | Disposition: A | Discharge status: Home / Self Care | Payer: Medicare Other | Attending: Emergency Medicine | Admitting: Emergency Medicine

## 2019-01-08 ENCOUNTER — Emergency Department (HOSPITAL_COMMUNITY): Payer: Medicare Other

## 2019-01-08 DIAGNOSIS — Z1159 Encounter for screening for other viral diseases: Secondary | ICD-10-CM | POA: Diagnosis not present

## 2019-01-08 DIAGNOSIS — Z8673 Personal history of transient ischemic attack (TIA), and cerebral infarction without residual deficits: Secondary | ICD-10-CM | POA: Diagnosis not present

## 2019-01-08 DIAGNOSIS — Z7901 Long term (current) use of anticoagulants: Secondary | ICD-10-CM | POA: Diagnosis not present

## 2019-01-08 DIAGNOSIS — S7002XA Contusion of left hip, initial encounter: Secondary | ICD-10-CM | POA: Diagnosis not present

## 2019-01-08 DIAGNOSIS — I130 Hypertensive heart and chronic kidney disease with heart failure and stage 1 through stage 4 chronic kidney disease, or unspecified chronic kidney disease: Secondary | ICD-10-CM | POA: Insufficient documentation

## 2019-01-08 DIAGNOSIS — E1122 Type 2 diabetes mellitus with diabetic chronic kidney disease: Secondary | ICD-10-CM | POA: Insufficient documentation

## 2019-01-08 DIAGNOSIS — Y998 Other external cause status: Secondary | ICD-10-CM | POA: Diagnosis not present

## 2019-01-08 DIAGNOSIS — N183 Chronic kidney disease, stage 3 (moderate): Secondary | ICD-10-CM | POA: Diagnosis not present

## 2019-01-08 DIAGNOSIS — W19XXXA Unspecified fall, initial encounter: Secondary | ICD-10-CM | POA: Diagnosis not present

## 2019-01-08 DIAGNOSIS — Z794 Long term (current) use of insulin: Secondary | ICD-10-CM | POA: Diagnosis not present

## 2019-01-08 DIAGNOSIS — M25552 Pain in left hip: Secondary | ICD-10-CM | POA: Diagnosis not present

## 2019-01-08 DIAGNOSIS — M79605 Pain in left leg: Secondary | ICD-10-CM | POA: Diagnosis not present

## 2019-01-08 DIAGNOSIS — Z79899 Other long term (current) drug therapy: Secondary | ICD-10-CM | POA: Insufficient documentation

## 2019-01-08 DIAGNOSIS — Y929 Unspecified place or not applicable: Secondary | ICD-10-CM | POA: Diagnosis not present

## 2019-01-08 DIAGNOSIS — R1030 Lower abdominal pain, unspecified: Secondary | ICD-10-CM | POA: Diagnosis not present

## 2019-01-08 DIAGNOSIS — R42 Dizziness and giddiness: Secondary | ICD-10-CM | POA: Diagnosis not present

## 2019-01-08 DIAGNOSIS — Z03818 Encounter for observation for suspected exposure to other biological agents ruled out: Secondary | ICD-10-CM | POA: Diagnosis not present

## 2019-01-08 DIAGNOSIS — Y9389 Activity, other specified: Secondary | ICD-10-CM | POA: Insufficient documentation

## 2019-01-08 DIAGNOSIS — I10 Essential (primary) hypertension: Secondary | ICD-10-CM | POA: Diagnosis not present

## 2019-01-08 DIAGNOSIS — M545 Low back pain: Secondary | ICD-10-CM | POA: Diagnosis not present

## 2019-01-08 DIAGNOSIS — S83412A Sprain of medial collateral ligament of left knee, initial encounter: Secondary | ICD-10-CM | POA: Insufficient documentation

## 2019-01-08 DIAGNOSIS — Z049 Encounter for examination and observation for unspecified reason: Secondary | ICD-10-CM | POA: Diagnosis not present

## 2019-01-08 DIAGNOSIS — S3992XA Unspecified injury of lower back, initial encounter: Secondary | ICD-10-CM | POA: Diagnosis not present

## 2019-01-08 DIAGNOSIS — I5032 Chronic diastolic (congestive) heart failure: Secondary | ICD-10-CM | POA: Insufficient documentation

## 2019-01-08 DIAGNOSIS — W1830XA Fall on same level, unspecified, initial encounter: Secondary | ICD-10-CM | POA: Diagnosis not present

## 2019-01-08 DIAGNOSIS — Z952 Presence of prosthetic heart valve: Secondary | ICD-10-CM | POA: Insufficient documentation

## 2019-01-08 DIAGNOSIS — S79912A Unspecified injury of left hip, initial encounter: Secondary | ICD-10-CM | POA: Diagnosis not present

## 2019-01-08 LAB — CBC WITH DIFFERENTIAL/PLATELET
Abs Immature Granulocytes: 0.06 10*3/uL (ref 0.00–0.07)
Basophils Absolute: 0.1 10*3/uL (ref 0.0–0.1)
Basophils Relative: 1 %
Eosinophils Absolute: 0.3 10*3/uL (ref 0.0–0.5)
Eosinophils Relative: 3 %
HCT: 45.3 % (ref 36.0–46.0)
Hemoglobin: 14.7 g/dL (ref 12.0–15.0)
Immature Granulocytes: 1 %
Lymphocytes Relative: 19 %
Lymphs Abs: 1.7 10*3/uL (ref 0.7–4.0)
MCH: 27.5 pg (ref 26.0–34.0)
MCHC: 32.5 g/dL (ref 30.0–36.0)
MCV: 84.8 fL (ref 80.0–100.0)
Monocytes Absolute: 0.9 10*3/uL (ref 0.1–1.0)
Monocytes Relative: 9 %
Neutro Abs: 6.2 10*3/uL (ref 1.7–7.7)
Neutrophils Relative %: 67 %
Platelets: 189 10*3/uL (ref 150–400)
RBC: 5.34 MIL/uL — ABNORMAL HIGH (ref 3.87–5.11)
RDW: 15.1 % (ref 11.5–15.5)
WBC: 9.2 10*3/uL (ref 4.0–10.5)
nRBC: 0 % (ref 0.0–0.2)

## 2019-01-08 LAB — BASIC METABOLIC PANEL
Anion gap: 13 (ref 5–15)
BUN: 64 mg/dL — ABNORMAL HIGH (ref 8–23)
CO2: 26 mmol/L (ref 22–32)
Calcium: 9.2 mg/dL (ref 8.9–10.3)
Chloride: 99 mmol/L (ref 98–111)
Creatinine, Ser: 2.97 mg/dL — ABNORMAL HIGH (ref 0.44–1.00)
GFR calc Af Amer: 18 mL/min — ABNORMAL LOW (ref >60)
GFR calc non Af Amer: 16 mL/min — ABNORMAL LOW (ref >60)
Glucose, Bld: 352 mg/dL — ABNORMAL HIGH (ref 70–99)
Potassium: 3.9 mmol/L (ref 3.5–5.1)
Sodium: 138 mmol/L (ref 135–145)

## 2019-01-08 LAB — I-STAT TROPONIN, ED: Troponin i, poc: 0.02 ng/mL (ref 0.00–0.08)

## 2019-01-08 MED ORDER — SODIUM CHLORIDE 0.9 % IV SOLN: INTRAVENOUS | Status: DC

## 2019-01-08 NOTE — ED Triage Notes (Signed)
Pt to ED via [facility] after reported falling.  Pt st's she was attempting to sit in a chair and missed the chair and slid to the ground.  Pt c/o pain to left leg

## 2019-01-08 NOTE — ED Notes (Signed)
Pt st's her left knee feels like it is going give way when she stands.

## 2019-01-08 NOTE — ED Provider Notes (Signed)
[facility] EMERGENCY DEPARTMENT Provider Note   CSN: [id number] Arrival date & time: 01/08/19  2105    History   Chief Complaint Chief Complaint  Patient presents with  . Fall    HPI [name] is a 68 y.o. female.     68 year old female here for mechanical fall just prior to arrival.  Patient states that this is her second fall today.  States that she at times becomes dizziness without associated chest pain or chest pressure.  Does not have any shortness of breath.  Symptoms get worse when she makes certain movements and thinks is with her blood pressure medication.  Denies striking her head or hurting her neck.  Prior to arrival today, she had a second or so and she was able to sit down on her left side.  She baseline uses a walker to ambulate.  Complains of pain in her left groin without radiation to her leg.  Denies any recent illnesses.  No recent changes to her medications.  Called EMS and was transported here     Past Medical History:  Diagnosis Date  . Atrial fibrillation ([place])    a. maintaining sinus s/p DCCV, on Eliquis  . CAD (coronary artery disease)    a. Nonobstructive by cath 2006 - 25% LAD, 25-30% after 1st diag, distal 25% PDA, minor irreg LCx. b. Normal nuc 2011.  . CKD (chronic kidney disease), stage III ([place])   . Cystocele   . Difficult intubation 09-07-2012   big neck trouble with intubation parotid surgery"takes little med to sedate"  . Esophageal stricture   . GERD (gastroesophageal reflux disease)   . Hiatal hernia    a. 07/2013: HH with small stricture holding up barium tablet (concides with patient's sx of food sticking).  . History of kidney stones   . Hyperlipidemia   . Hypertension   . IBS (irritable bowel syndrome)   . Memory difficulty 11/26/2015  . Morbid obesity ([place])    a. hx of gastric bypass (sleeve gastrectomy 2015)  . Osteoporosis   . 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  Marland Kitchen Pericardial  effusion    a. Mod by echo 2012 at time of PNA. b. Echo 07/2012: small pericardial effusion vs fat.  . S/P TAVR (transcatheter aortic valve replacement)   . Severe aortic stenosis   . Sleep apnea     "have mask; don't use it" (02/14/2018)  . TIA (transient ischemic attack) 10/2014; 06/2016   "some memory issues since; daughter says my speech is sometimes different" (02/14/2018)  . Type II diabetes mellitus [facility])     Patient Active Problem List   Diagnosis Date Noted  . Anasarca 09/19/2018  . Acute encephalopathy 06/16/2018  . 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 Acute on chronic diastolic CHF (congestive heart failure) ([place]) 05/10/2018  . Morbid obesity ([place])   . Hypertension   . CKD (chronic kidney disease), stage III ([place])   . Sleep apnea   . Hyperlipidemia   . GERD (gastroesophageal reflux disease)   . Atrial fibrillation ([place])   . S/P TAVR (transcatheter aortic valve replacement)   . Severe aortic stenosis   . Vertigo 05/06/2015  . TIA (transient ischemic attack), history of 11/07/2014  . 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 CKD stage 3 due to type 2 diabetes mellitus ([place]) 06/03/2014  . Type II diabetes mellitus with renal manifestations ([place]) 06/03/2014  . Type II diabetes mellitus with peripheral circulatory  disorder ([place]) 06/03/2014  . Benign essential HTN 12/26/2008  . Coronary artery disease, non-occlusive 12/26/2008  . Pericardial effusion 12/26/2008    Past Surgical History:  Procedure Laterality Date  . BREATH TEK H PYLORI N/A 07/29/2013   Procedure: [place];  Surgeon: Shann Medal, MD;  Location: [facility];  Service: General;  Laterality: N/A;  . CARDIAC CATHETERIZATION  2006  . CARDIOVERSION N/A 02/16/2018   Procedure: CARDIOVERSION;  Surgeon: Pixie Casino, MD;  Location: [place];  Service: Cardiovascular;  Laterality: N/A;  . CARPAL TUNNEL RELEASE Left 2011  . CATARACT EXTRACTION W/ INTRAOCULAR LENS  IMPLANT, BILATERAL Bilateral 2003    left  . CHOLECYSTECTOMY OPEN  1982  . COLONOSCOPY N/A 07/14/2015   Procedure: COLONOSCOPY;  Surgeon: Ladene Artist, MD;  Location: [facility];  Service: Endoscopy;  Laterality: N/A;  . ESOPHAGOGASTRODUODENOSCOPY N/A 12/27/2013   Procedure: ESOPHAGOGASTRODUODENOSCOPY (EGD);  Surgeon: Shann Medal, MD;  Location: [facility];  Service: General;  Laterality: N/A;  . ESOPHAGOGASTRODUODENOSCOPY (EGD) WITH ESOPHAGEAL DILATION    . INTRAOPERATIVE TRANSTHORACIC ECHOCARDIOGRAM  05/08/2018   Procedure: INTRAOPERATIVE TRANSTHORACIC ECHOCARDIOGRAM;  Surgeon: Burnell Blanks, MD;  Location: [place];  Service: Open Heart Surgery;;  . LAPAROSCOPIC GASTRIC SLEEVE RESECTION N/A 01/20/2014   Procedure: LAPAROSCOPIC GASTRIC SLEEVE RESECTION;  Surgeon: Shann Medal, MD;  Location: [facility];  Service: General;  Laterality: N/A;  . PAROTIDECTOMY  09/07/2012   Procedure: PAROTIDECTOMY;  Surgeon: Melida Quitter, MD;  Location: [place];  Service: ENT;  Laterality: Left;  LEFT PAROTIDECTOMY  . RIGHT HEART CATH N/A 09/25/2018   Procedure: RIGHT HEART CATH;  Surgeon: Larey Dresser, MD;  Location: [place] [facility];  Service: Cardiovascular;  Laterality: N/A;  . RIGHT/LEFT HEART CATH AND CORONARY ANGIOGRAPHY N/A 03/29/2018   Procedure: RIGHT/LEFT HEART CATH AND CORONARY ANGIOGRAPHY;  Surgeon: Burnell Blanks, MD;  Location: [place] [facility];  Service: Cardiovascular;  Laterality: N/A;  . TEE WITHOUT CARDIOVERSION N/A 02/16/2018   Procedure: TRANSESOPHAGEAL ECHOCARDIOGRAM (TEE);  Surgeon: Pixie Casino, MD;  Location: [facility] ENDOSCOPY;  Service: Cardiovascular;  Laterality: N/A;  . TOE AMPUTATION Left 07/2017   great toe; [facility]   . TONSILLECTOMY  1968  . TRANSCATHETER AORTIC VALVE REPLACEMENT, TRANSFEMORAL  05/08/2018   Transcatheter Aortic Valve Replacement   . TRANSCATHETER AORTIC VALVE REPLACEMENT, TRANSFEMORAL N/A 05/08/2018   Procedure: TRANSCATHETER AORTIC VALVE REPLACEMENT, TRANSFEMORAL;  Surgeon:  Burnell Blanks, MD;  Location: [place];  Service: Open Heart Surgery;  Laterality: N/A;  . TUBAL LIGATION  yrs ago  . UPPER GI ENDOSCOPY  01/20/2014   Procedure: UPPER GI ENDOSCOPY;  Surgeon: Shann Medal, MD;  Location: [facility];  Service: General;;     OB History    Gravida  3   Para  3   Term  3   Preterm      AB      Living  3     SAB      TAB      Ectopic      Multiple      Live Births  3            Home Medications    Prior to Admission medications   Medication Sig Start Date End Date Taking? Authorizing Provider  ACCU-CHEK AVIVA PLUS test strip USE TO CHECK BLOOD SUGAR UP TO TWICE DAILY OR AS INSTRUCTED 09/04/17   Chipper Herb, MD  apixaban (ELIQUIS) 5 MG TABS  tablet Take 1 tablet (5 mg total) by mouth 2 (two) times daily. 03/19/18   Minus Breeding, MD  B-D INS SYR ULTRAFINE 1CC/31G 31G X 5/16" 1 ML MISC USE TO INJECT INSULIN TWICE A DAY AS INSTRUCTED Patient taking differently: 1 Stick by Other route 3 (three) times daily before meals.  11/16/15   Chipper Herb, MD  Calcium Carbonate-Vitamin D (CALCIUM 600+D) 600-400 MG-UNIT per tablet Take 1 tablet by mouth 2 (two) times daily. Patient taking differently: Take 1 tablet by mouth 3 (three) times a week.  05/06/15   Cherre Robins, PharmD  carvedilol (COREG) 25 MG tablet Take 1 tablet (25 mg total) by mouth 2 (two) times daily. 06/21/18 06/16/19  Eileen Stanford, PA-C  Cholecalciferol (VITAMIN D3) 50 MCG (2000 UT) TABS Take 2,000 Units by mouth daily.    [provider]  clindamycin (CLEOCIN) 300 MG capsule Take 2 tablets (600 mg) one hour prior to dental visits. 05/24/18   Eileen Stanford, PA-C  doxycycline (VIBRA-TABS) 100 MG tablet Take 1 tablet (100 mg total) by mouth 2 (two) times daily. 1 po bid Patient not taking: Reported on 11/16/2018 10/29/18   Chevis Pretty, FNP  hydrALAZINE (APRESOLINE) 10 MG tablet Take 1 tablet (10 mg total) by mouth every morning AND 1 tablet (10 mg  total) daily with lunch AND 2 tablets (20 mg total) at bedtime. 12/12/18   Larey Dresser, MD  Icosapent Ethyl 1 g CAPS Take 2 capsules (2 g total) by mouth 2 (two) times a day. 12/21/18   Larey Dresser, MD  Insulin Pen Needle 32G X 4 MM MISC Use to inject insulin with insulin pen 05/17/16   Chipper Herb, MD  LIVALO 4 MG TABS TAKE 1 TABLET BY MOUTH ONCE DAILY 09/24/18   Chipper Herb, MD  meclizine (ANTIVERT) 12.5 MG tablet Take 12.5 mg by mouth 3 (three) times daily as needed for dizziness.    [provider]  mupirocin ointment (BACTROBAN) 2 % Apply 1 application topically 2 (two) times daily. 06/19/18   Chipper Herb, MD  NOVOLIN 70/30 RELION (70-30) 100 UNIT/ML injection Inject 15-65 Units into the skin See admin instructions. Taking 65 units with breakfast and 20 units at lunch and 65 units with dinner. 07/13/16   [provider]  ondansetron (ZOFRAN ODT) 4 MG disintegrating tablet Take 1 tablet (4 mg total) by mouth every 8 (eight) hours as needed for nausea. 01/03/17   Larene Pickett, PA-C  Prenatal Vit-Fe Fumarate-FA (PRENATAL MULTIVITAMIN) TABS tablet Take 1 tablet by mouth 3 (three) times a week.    [provider]  Propylene Glycol (SYSTANE COMPLETE) 0.6 % SOLN Apply 1 drop to eye daily as needed (dry eyes).     [provider]  sodium chloride (OCEAN) 0.65 % SOLN nasal spray Place 1 spray into both nostrils as needed for congestion. 11/09/14   Domenic Polite, MD  torsemide (DEMADEX) 20 MG tablet Take 1.5 tablets (30 mg total) by mouth every morning AND 1 tablet (20 mg total) at bedtime. 12/12/18   Larey Dresser, MD  vitamin E 400 UNIT capsule Take 400 Units by mouth 3 (three) times a week.     [provider]  Olmesartan-Amlodipine-HCTZ (TRIBENZOR) 40-5-12.5 MG TABS Take 1 tablet by mouth daily.    04/12/18  [provider]    Family History Family History  Problem Relation Age of Onset  . Heart failure Mother   .  Hypertension Mother   .  Skin cancer Mother   . Colitis Mother   . Diabetes Mother   . Kidney Stones Mother   . Stroke Mother   . Heart disease Father   . Colon polyps Father   . Diabetes Father   . Heart failure Father   . Heart attack Sister   . Diabetes Other   . Stroke Maternal Grandfather   . Diabetes Maternal Grandfather   . Colon cancer Neg Hx     Social History Social History   Tobacco Use  . Smoking status: Never Smoker  . Smokeless tobacco: Never Used  Substance Use Topics  . Alcohol use: Yes    Alcohol/week: 0.0 standard drinks    Comment:  "mixed drink q few years"  . Drug use: Never     Allergies   Amlodipine; Atorvastatin; Crestor [rosuvastatin calcium]; Ezetimibe-simvastatin; Statins; Celebrex [celecoxib]; Levemir [insulin detemir]; and Penicillins   Review of Systems Review of Systems  All other systems reviewed and are negative.    Physical Exam Updated Vital Signs BP (!) 188/76 (BP Location: Right Arm)   Pulse 88   Temp 98.5 F (36.9 C) (Oral)   Resp (!) 25   Ht 1.702 m (5\' 7" )   Wt 92.5 kg   SpO2 95%   BMI 31.95 kg/m   Physical Exam Vitals signs and nursing note reviewed.  Constitutional:      General: She is not in acute distress.    Appearance: Normal appearance. She is well-developed. She is not toxic-appearing.  HENT:     Head: Normocephalic and atraumatic.  Eyes:     General: Lids are normal.     Conjunctiva/sclera: Conjunctivae normal.     Pupils: Pupils are equal, round, and reactive to light.  Neck:     Musculoskeletal: Normal range of motion and neck supple.     Thyroid: No thyroid mass.     Trachea: No tracheal deviation.  Cardiovascular:     Rate and Rhythm: Normal rate and regular rhythm.     Heart sounds: Normal heart sounds. No murmur. No gallop.   Pulmonary:     Effort: Pulmonary effort is normal. No respiratory distress.     Breath sounds: Normal breath sounds. No stridor. No decreased breath sounds, wheezing,  rhonchi or rales.  Abdominal:     General: Bowel sounds are normal. There is no distension.     Palpations: Abdomen is soft.     Tenderness: There is no abdominal tenderness. There is no rebound.  Musculoskeletal: Normal range of motion.        General: No tenderness.       Back:     Comments: No shortening or rotation at the left lower extremity.  Some pain with internal rotation of the left lower extremity.  Neurovascular intact at left foot  Skin:    General: Skin is warm and dry.     Findings: No abrasion or rash.  Neurological:     Mental Status: She is alert and oriented to person, place, and time.     GCS: GCS eye subscore is 4. GCS verbal subscore is 5. GCS motor subscore is 6.     Cranial Nerves: No cranial nerve deficit.     Sensory: No sensory deficit.  Psychiatric:        Speech: Speech normal.        Behavior: Behavior normal.      ED Treatments / Results  Labs (all labs ordered are listed, but only abnormal results  are displayed) Labs Reviewed  SARS CORONAVIRUS 2 (HOSPITAL ORDER, [place] [facility])  CBC WITH DIFFERENTIAL/PLATELET  BASIC METABOLIC PANEL  I-STAT TROPONIN, ED    EKG EKG Interpretation  Date/Time:  Tuesday Jan 08 2019 21:16:39 EDT Ventricular Rate:  88 PR Interval:    QRS Duration: 96 QT Interval:  381 QTC Calculation: 461 R Axis:   54 Text Interpretation:  Sinus rhythm Anteroseptal infarct, old Repol abnrm, severe global ischemia (LM/MVD) Confirmed by Lacretia Leigh ([id number]) on 01/08/2019 9:20:21 PM   Radiology No results found.  Procedures Procedures (including critical care time)  Medications Ordered in ED Medications  0.9 %  sodium chloride infusion (has no administration in time range)     Initial Impression / Assessment and Plan / ED Course  I have reviewed the triage vital signs and the nursing notes.  Pertinent labs & imaging results that were available during my care of the patient were reviewed by me  and considered in my medical decision making (see chart for details).        Patient with negative x-rays of her LS spine as well as left hip.  Patient denied having any chest pain or shortness of breath today.  EKG does show some new changes.  First troponin negative.  Patient still complaining of pain to her left hip and will order CT without fracture.  Will also have second troponin.  Will send to Dr. Leonette Monarch  Final Clinical Impressions(s) / ED Diagnoses   Final diagnoses:  None    ED Discharge Orders    None       Lacretia Leigh, MD 01/08/19 2304

## 2019-01-08 NOTE — ED Notes (Signed)
Pt to xray at this time.

## 2019-01-09 DIAGNOSIS — S7002XA Contusion of left hip, initial encounter: Secondary | ICD-10-CM | POA: Diagnosis not present

## 2019-01-09 LAB — SARS CORONAVIRUS 2 BY RT PCR (HOSPITAL ORDER, PERFORMED IN ~~LOC~~ HOSPITAL LAB): SARS Coronavirus 2: NEGATIVE

## 2019-01-09 LAB — I-STAT TROPONIN, ED: Troponin i, poc: 0.03 ng/mL (ref 0.00–0.08)

## 2019-01-09 MED ORDER — ACETAMINOPHEN 500 MG PO TABS
1000.0000 mg | ORAL_TABLET | Freq: Once | ORAL | Status: AC
  Administered 2019-01-09: 1000 mg via ORAL
  Filled 2019-01-09: qty 2

## 2019-01-09 MED ORDER — METHOCARBAMOL 1000 MG/10ML IJ SOLN
500.0000 mg | Freq: Once | INTRAVENOUS | Status: AC
  Administered 2019-01-09: 500 mg via INTRAVENOUS
  Filled 2019-01-09: qty 500

## 2019-01-09 MED ORDER — METHOCARBAMOL 500 MG PO TABS: 500.0000 mg | ORAL_TABLET | Freq: Two times a day (BID) | ORAL | 0 refills | Status: DC

## 2019-01-09 MED ORDER — MORPHINE SULFATE (PF) 2 MG/ML IV SOLN
2.0000 mg | Freq: Once | INTRAVENOUS | Status: AC
  Administered 2019-01-09: 2 mg via INTRAVENOUS
  Filled 2019-01-09: qty 1

## 2019-01-09 MED ORDER — METHOCARBAMOL 1000 MG/10ML IJ SOLN
500.0000 mg | Freq: Once | INTRAMUSCULAR | Status: DC
  Filled 2019-01-09: qty 5

## 2019-01-09 MED ORDER — ACETAMINOPHEN 500 MG PO TABS: 1000.0000 mg | ORAL_TABLET | Freq: Three times a day (TID) | ORAL | 0 refills | Status: AC

## 2019-01-09 MED ORDER — SODIUM CHLORIDE 0.9 % IV BOLUS
500.0000 mL | Freq: Once | INTRAVENOUS | Status: AC
  Administered 2019-01-09: 01:00:00 500 mL via INTRAVENOUS

## 2019-01-09 NOTE — ED Notes (Signed)
Notified pharmacy again that we have not received robaxin.

## 2019-01-09 NOTE — ED Provider Notes (Addendum)
I assumed care of this patient from Dr. Zenia Resides at 2300.  Please see their note for further details of Hx, PE.  Briefly patient is a 68 y.o. female who presented with mechanical fall resulting in left hip pain.  Plain film of the left hip and lumbar spine negative.  The patient still complaining of intermittent left hip pain.  Pending CT scan.  On work-up also noted to have new EKG changes but patient was not having any chest pain or shortness of breath.  Initial troponin was negative plan for delta troponin..   CT of the left hip without acute fractures or dislocations.  Hip pain appears to be muscular in nature with a spastic component.  Patient also complaining of left knee pain but does not have significant tenderness to palpation, or swelling concerning for acute fracture dislocation.  Likely knee sprain.   Delta troponin negative  Given patient's difficulty ambulating and instability, home health with PT/OT was discussed with patient and family who accepted.  Also provided patient with a prescription for knee brace and wheelchair.  Patient and family were agreeable to discharge home with these resources.  The patient is safe for discharge with strict return precautions.  Disposition: Discharge  Condition: Good  I have discussed the results, Dx and Tx plan with the patient who expressed understanding and agree(s) with the plan. Discharge instructions discussed at great length. The patient was given strict return precautions who verbalized understanding of the instructions. No further questions at time of discharge.    ED Discharge Orders         Ordered    Wheelchair     01/09/19 0648    methocarbamol (ROBAXIN) 500 MG tablet  2 times daily     01/09/19 0648    acetaminophen (TYLENOL) 500 MG tablet  Every 8 hours     01/09/19 0648    Knee brace     01/09/19 4128           Follow Up: Chipper Herb, MD [place] [address] [phone]  Schedule an appointment  as soon as possible for a visit  As needed  Marchia Bond, MD [place]. [address] [place] [address] [phone]  Schedule an appointment as soon as possible for a visit  As needed to be assessed for possible knee sprain      Fatima Blank, MD 01/09/19 [phone]

## 2019-01-09 NOTE — ED Notes (Signed)
Multiple efforts have been made for pt comfort (heat packs, adjustment in bed, change in position, pain medication) with no relief. Pt continues to groan/cry out with movement.

## 2019-01-11 ENCOUNTER — Telehealth: Payer: Self-pay | Admitting: Family Medicine

## 2019-01-11 NOTE — Telephone Encounter (Signed)
hosp follow up made

## 2019-01-14 ENCOUNTER — Ambulatory Visit: Payer: Medicare Other | Admitting: Family Medicine

## 2019-01-14 DIAGNOSIS — I10 Essential (primary) hypertension: Secondary | ICD-10-CM | POA: Diagnosis not present

## 2019-01-14 DIAGNOSIS — E1151 Type 2 diabetes mellitus with diabetic peripheral angiopathy without gangrene: Secondary | ICD-10-CM | POA: Diagnosis not present

## 2019-01-14 DIAGNOSIS — I4891 Unspecified atrial fibrillation: Secondary | ICD-10-CM | POA: Diagnosis not present

## 2019-01-14 DIAGNOSIS — W19XXXD Unspecified fall, subsequent encounter: Secondary | ICD-10-CM | POA: Diagnosis not present

## 2019-01-14 DIAGNOSIS — E1122 Type 2 diabetes mellitus with diabetic chronic kidney disease: Secondary | ICD-10-CM | POA: Diagnosis not present

## 2019-01-14 DIAGNOSIS — Z794 Long term (current) use of insulin: Secondary | ICD-10-CM | POA: Diagnosis not present

## 2019-01-14 DIAGNOSIS — S7002XD Contusion of left hip, subsequent encounter: Secondary | ICD-10-CM | POA: Diagnosis not present

## 2019-01-14 DIAGNOSIS — I2510 Atherosclerotic heart disease of native coronary artery without angina pectoris: Secondary | ICD-10-CM | POA: Diagnosis not present

## 2019-01-14 DIAGNOSIS — S83412D Sprain of medial collateral ligament of left knee, subsequent encounter: Secondary | ICD-10-CM | POA: Diagnosis not present

## 2019-01-14 DIAGNOSIS — N183 Chronic kidney disease, stage 3 (moderate): Secondary | ICD-10-CM | POA: Diagnosis not present

## 2019-01-15 ENCOUNTER — Ambulatory Visit (INDEPENDENT_AMBULATORY_CARE_PROVIDER_SITE_OTHER): Payer: Medicare Other | Admitting: Family Medicine

## 2019-01-15 ENCOUNTER — Other Ambulatory Visit: Payer: Self-pay

## 2019-01-15 VITALS — BP 113/61 | HR 75 | Temp 97.8°F | Ht 67.0 in | Wt 200.0 lb

## 2019-01-15 DIAGNOSIS — R0989 Other specified symptoms and signs involving the circulatory and respiratory systems: Secondary | ICD-10-CM | POA: Diagnosis not present

## 2019-01-15 DIAGNOSIS — I739 Peripheral vascular disease, unspecified: Secondary | ICD-10-CM | POA: Diagnosis not present

## 2019-01-15 DIAGNOSIS — M25362 Other instability, left knee: Secondary | ICD-10-CM

## 2019-01-15 DIAGNOSIS — L97412 Non-pressure chronic ulcer of right heel and midfoot with fat layer exposed: Secondary | ICD-10-CM | POA: Diagnosis not present

## 2019-01-15 DIAGNOSIS — E1142 Type 2 diabetes mellitus with diabetic polyneuropathy: Secondary | ICD-10-CM | POA: Diagnosis not present

## 2019-01-15 DIAGNOSIS — L97529 Non-pressure chronic ulcer of other part of left foot with unspecified severity: Secondary | ICD-10-CM | POA: Diagnosis not present

## 2019-01-15 DIAGNOSIS — M25562 Pain in left knee: Secondary | ICD-10-CM

## 2019-01-15 NOTE — Patient Instructions (Signed)
You will be called with the appointment for your knee.  Continue Biofreeze.  Use the Methocarbamol with extreme caution. This can cause sleepiness and increase falls.  The Vascepa is a good cardioprotective medication.  I would certainly talk to Dr Aundra Dubin before stopping.

## 2019-01-15 NOTE — Progress Notes (Signed)
Subjective: CC: ED follow up for fall PCP: Chipper Herb, MD SJG:GEZMOQH P [name] is a 68 y.o. female presenting to clinic today for:  1.  Fall Patient was seen in the emergency department on 01/08/2019 after she sustained a fall when she became dizzy.  She fell onto her left side and was having pain and therefore CT was obtained to further evaluate.  CT demonstrated no evidence of fracture or dislocation.  She was discharged with home health physical therapy/occupational therapy, wheelchair and brace.  Additionally, new changes were noted on her EKG but troponins were negative x2 in the emergency department.  She reports that she has had ongoing left lower extremity pain, particularly in the lateral calf with ambulation.  Denies any significant swelling or discoloration.  She is also had medial knee pain and was told that her symptoms may be consistent with meniscal injury.  Again, she denies any significant swelling or pain but does report instability of the knee with ambulation.  She has been dependent upon wheelchair since her last visit.  Physical therapy has started at home.  She continues to use Biofreeze to the affected areas with moderate relief.  She never started the muscle relaxer.   ROS: Per HPI  Allergies  Allergen Reactions  . Amlodipine Swelling    Ankle swelling  . Atorvastatin Other (See Comments)    Myalgias   . Crestor [Rosuvastatin Calcium] Other (See Comments)    Stiffness and back pain  . Ezetimibe-Simvastatin Other (See Comments)    Leg cramps  . Statins Other (See Comments)    Myalgias  . Celebrex [Celecoxib] Rash  . Levemir [Insulin Detemir] Rash  . Penicillins Other (See Comments)    SYNCOPE (Tolerated Ancef) Has patient had a PCN reaction causing immediate rash, facial/tongue/throat swelling, SOB or lightheadedness with hypotension: Yes Has patient had a PCN reaction causing severe rash involving mucus membranes or skin necrosis: No Has patient had a PCN  reaction that required hospitalization No Has patient had a PCN reaction occurring within the last 10 years: No If all of the above answers are "NO", then may proceed with Cephalosporin use.   Past Medical History:  Diagnosis Date  . Atrial fibrillation ([place])    a. maintaining sinus s/p DCCV, on Eliquis  . CAD (coronary artery disease)    a. Nonobstructive by cath 2006 - 25% LAD, 25-30% after 1st diag, distal 25% PDA, minor irreg LCx. b. Normal nuc 2011.  . CKD (chronic kidney disease), stage III ([place])   . Cystocele   . Difficult intubation 09-07-2012   big neck trouble with intubation parotid surgery"takes little med to sedate"  . Esophageal stricture   . GERD (gastroesophageal reflux disease)   . Hiatal hernia    a. 07/2013: HH with small stricture holding up barium tablet (concides with patient's sx of food sticking).  . History of kidney stones   . Hyperlipidemia   . Hypertension   . IBS (irritable bowel syndrome)   . Memory difficulty 11/26/2015  . Morbid obesity ([place])    a. hx of gastric bypass (sleeve gastrectomy 2015)  . Osteoporosis   . 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  Marland Kitchen Pericardial effusion    a. Mod by echo 2012 at time of PNA. b. Echo 07/2012: small pericardial effusion vs fat.  . S/P TAVR (transcatheter aortic valve replacement)   . Severe aortic stenosis   . Sleep apnea     "have mask;  don't use it" (02/14/2018)  . TIA (transient ischemic attack) 10/2014; 06/2016   "some memory issues since; daughter says my speech is sometimes different" (02/14/2018)  . Type II diabetes mellitus ([place])     Current Outpatient Medications:  .  ACCU-CHEK AVIVA PLUS test strip, USE TO CHECK BLOOD SUGAR UP TO TWICE DAILY OR AS INSTRUCTED, Disp: 100 each, Rfl: 2 .  apixaban (ELIQUIS) 5 MG TABS tablet, Take 1 tablet (5 mg total) by mouth 2 (two) times daily., Disp: 180 tablet, Rfl: 3 .  B-D INS SYR ULTRAFINE 1CC/31G 31G X 5/16" 1 ML MISC, USE TO INJECT INSULIN TWICE  A DAY AS INSTRUCTED (Patient taking differently: 1 Stick by Other route 3 (three) times daily before meals. ), Disp: 100 each, Rfl: 2 .  Calcium Carbonate-Vitamin D (CALCIUM 600+D) 600-400 MG-UNIT per tablet, Take 1 tablet by mouth 2 (two) times daily. (Patient taking differently: Take 1 tablet by mouth 3 (three) times a week. ), Disp: , Rfl:  .  carvedilol (COREG) 25 MG tablet, Take 1 tablet (25 mg total) by mouth 2 (two) times daily., Disp: 60 tablet, Rfl: 11 .  Cholecalciferol (VITAMIN D3) 50 MCG (2000 UT) TABS, Take 2,000 Units by mouth daily., Disp: , Rfl:  .  clindamycin (CLEOCIN) 300 MG capsule, Take 2 tablets (600 mg) one hour prior to dental visits., Disp: 6 capsule, Rfl: 3 .  doxycycline (VIBRA-TABS) 100 MG tablet, Take 1 tablet (100 mg total) by mouth 2 (two) times daily. 1 po bid (Patient not taking: Reported on 11/16/2018), Disp: 20 tablet, Rfl: 0 .  hydrALAZINE (APRESOLINE) 10 MG tablet, Take 1 tablet (10 mg total) by mouth every morning AND 1 tablet (10 mg total) daily with lunch AND 2 tablets (20 mg total) at bedtime., Disp: 360 tablet, Rfl: 0 .  Icosapent Ethyl 1 g CAPS, Take 2 capsules (2 g total) by mouth 2 (two) times a day., Disp: 120 capsule, Rfl: 6 .  Insulin Pen Needle 32G X 4 MM MISC, Use to inject insulin with insulin pen, Disp: 100 each, Rfl: 4 .  LIVALO 4 MG TABS, TAKE 1 TABLET BY MOUTH ONCE DAILY, Disp: 90 tablet, Rfl: 1 .  meclizine (ANTIVERT) 12.5 MG tablet, Take 12.5 mg by mouth 3 (three) times daily as needed for dizziness., Disp: , Rfl:  .  methocarbamol (ROBAXIN) 500 MG tablet, Take 1 tablet (500 mg total) by mouth 2 (two) times daily., Disp: 20 tablet, Rfl: 0 .  mupirocin ointment (BACTROBAN) 2 %, Apply 1 application topically 2 (two) times daily., Disp: 22 g, Rfl: 0 .  NOVOLIN 70/30 RELION (70-30) 100 UNIT/ML injection, Inject 15-65 Units into the skin See admin instructions. Taking 65 units with breakfast and 20 units at lunch and 65 units with dinner., Disp: , Rfl:   .  ondansetron (ZOFRAN ODT) 4 MG disintegrating tablet, Take 1 tablet (4 mg total) by mouth every 8 (eight) hours as needed for nausea., Disp: 10 tablet, Rfl: 0 .  Prenatal Vit-Fe Fumarate-FA (PRENATAL MULTIVITAMIN) TABS tablet, Take 1 tablet by mouth 3 (three) times a week., Disp: , Rfl:  .  Propylene Glycol (SYSTANE COMPLETE) 0.6 % SOLN, Apply 1 drop to eye daily as needed (dry eyes). , Disp: , Rfl:  .  sodium chloride (OCEAN) 0.65 % SOLN nasal spray, Place 1 spray into both nostrils as needed for congestion., Disp: 15 mL, Rfl: 0 .  torsemide (DEMADEX) 20 MG tablet, Take 1.5 tablets (30 mg total) by mouth every morning  AND 1 tablet (20 mg total) at bedtime., Disp: 225 tablet, Rfl: 0 .  vitamin E 400 UNIT capsule, Take 400 Units by mouth 3 (three) times a week. , Disp: , Rfl:  Social History   Socioeconomic History  . Marital status: Married    Spouse name: Not on file  . Number of children: 3  . Years of education: some college  . Highest education level: Some college, no degree  Occupational History  . Occupation: retired    Fish farm manager: [employer]  . Financial resource strain: Not hard at all  . Food insecurity:    Worry: Never true    Inability: Never true  . Transportation needs:    Medical: No    Non-medical: No  Tobacco Use  . Smoking status: Never Smoker  . Smokeless tobacco: Never Used  Substance and Sexual Activity  . Alcohol use: Yes    Alcohol/week: 0.0 standard drinks    Comment:  "mixed drink q few years"  . Drug use: Never  . Sexual activity: Yes    Birth control/protection: Post-menopausal  Lifestyle  . Physical activity:    Days per week: 0 days    Minutes per session: 0 min  . Stress: Only a little  Relationships  . Social connections:    Talks on phone: More than three times a week    Gets together: More than three times a week    Attends religious service: More than 4 times per year    Active member of club or organization: Yes     Attends meetings of clubs or organizations: More than 4 times per year    Relationship status: Married  . Intimate partner violence:    Fear of current or ex partner: No    Emotionally abused: No    Physically abused: No    Forced sexual activity: No  Other Topics Concern  . Not on file  Social History Narrative   Married   Patient is right handed.   Patient rarely drinks caffeine.   Lives at home with husband in two story home. Husband washes laundry in the basement. They have 3 daughters and 7 grandchildren.    Family History  Problem Relation Age of Onset  . Heart failure Mother   . Hypertension Mother   . Skin cancer Mother   . Colitis Mother   . Diabetes Mother   . Kidney Stones Mother   . Stroke Mother   . Heart disease Father   . Colon polyps Father   . Diabetes Father   . Heart failure Father   . Heart attack Sister   . Diabetes Other   . Stroke Maternal Grandfather   . Diabetes Maternal Grandfather   . Colon cancer Neg Hx     Objective: Office vital signs reviewed. BP 113/61   Pulse 75   Temp 97.8 F (36.6 C) (Oral)   Ht 5\' 7"  (1.702 m)   Wt 200 lb (90.7 kg)   BMI 31.32 kg/m   Physical Examination:  General: Awake, alert, morbidly obese, No acute distress HEENT: Normal    Eyes: PERRLA, extraocular membranes intact, sclera white Extremities: warm, well perfused, No edema, cyanosis or clubbing; +2 pulses bilaterally MSK: gait not assessed. Arrives in wheelchair.  Left knee: No gross swelling, discoloration or visible deformities.  No tenderness to palpation to patella, patellar tendon or quad tendon.  She has mild tenderness palpation along the medial joint line.  No palpable bony  abnormalities.  No ligamentous laxity.  She is able to move the left lower extremity independently.  Lower extremity: No swelling of the calf, discoloration or palpable cords.  Negative Homans sign.  Assessment/ Plan: 68 y.o. female   1. Acute pain of left knee Possibly  related to fall.  I reviewed her emergency department notes and imaging reports which were unremarkable.  She has an EKG was some changes then she will need to follow-up with cardiology with regards to this.  In the interim, she is to continue current medications. - Ambulatory referral to Orthopedic Surgery  2. Instability of knee joint, left Possible meniscal injury.  Referral to orthopedics has been placed. - Ambulatory referral to Orthopedic Surgery   No orders of the defined types were placed in this encounter.  No orders of the defined types were placed in this encounter.    [name], DO [place] [phone]

## 2019-01-16 DIAGNOSIS — E1151 Type 2 diabetes mellitus with diabetic peripheral angiopathy without gangrene: Secondary | ICD-10-CM | POA: Diagnosis not present

## 2019-01-16 DIAGNOSIS — E1122 Type 2 diabetes mellitus with diabetic chronic kidney disease: Secondary | ICD-10-CM | POA: Diagnosis not present

## 2019-01-16 DIAGNOSIS — S83412D Sprain of medial collateral ligament of left knee, subsequent encounter: Secondary | ICD-10-CM | POA: Diagnosis not present

## 2019-01-16 DIAGNOSIS — S7002XD Contusion of left hip, subsequent encounter: Secondary | ICD-10-CM | POA: Diagnosis not present

## 2019-01-16 DIAGNOSIS — N183 Chronic kidney disease, stage 3 (moderate): Secondary | ICD-10-CM | POA: Diagnosis not present

## 2019-01-16 DIAGNOSIS — W19XXXD Unspecified fall, subsequent encounter: Secondary | ICD-10-CM | POA: Diagnosis not present

## 2019-01-21 ENCOUNTER — Telehealth: Payer: Self-pay | Admitting: Family Medicine

## 2019-01-21 NOTE — Telephone Encounter (Signed)
Patient aware this office is out of samples at this time

## 2019-01-23 ENCOUNTER — Ambulatory Visit (INDEPENDENT_AMBULATORY_CARE_PROVIDER_SITE_OTHER): Payer: Medicare Other | Admitting: Orthopaedic Surgery

## 2019-01-23 ENCOUNTER — Encounter: Payer: Self-pay | Admitting: Orthopaedic Surgery

## 2019-01-23 ENCOUNTER — Other Ambulatory Visit: Payer: Self-pay

## 2019-01-23 ENCOUNTER — Ambulatory Visit (INDEPENDENT_AMBULATORY_CARE_PROVIDER_SITE_OTHER): Payer: Medicare Other

## 2019-01-23 DIAGNOSIS — N183 Chronic kidney disease, stage 3 (moderate): Secondary | ICD-10-CM | POA: Diagnosis not present

## 2019-01-23 DIAGNOSIS — W19XXXD Unspecified fall, subsequent encounter: Secondary | ICD-10-CM | POA: Diagnosis not present

## 2019-01-23 DIAGNOSIS — S83412D Sprain of medial collateral ligament of left knee, subsequent encounter: Secondary | ICD-10-CM | POA: Diagnosis not present

## 2019-01-23 DIAGNOSIS — M25562 Pain in left knee: Secondary | ICD-10-CM | POA: Diagnosis not present

## 2019-01-23 DIAGNOSIS — S7002XD Contusion of left hip, subsequent encounter: Secondary | ICD-10-CM | POA: Diagnosis not present

## 2019-01-23 DIAGNOSIS — I2510 Atherosclerotic heart disease of native coronary artery without angina pectoris: Secondary | ICD-10-CM

## 2019-01-23 DIAGNOSIS — E1151 Type 2 diabetes mellitus with diabetic peripheral angiopathy without gangrene: Secondary | ICD-10-CM | POA: Diagnosis not present

## 2019-01-23 DIAGNOSIS — E1122 Type 2 diabetes mellitus with diabetic chronic kidney disease: Secondary | ICD-10-CM | POA: Diagnosis not present

## 2019-01-23 NOTE — Patient Outreach (Signed)
[facility]) Care Management  01/23/2019  AMENA DOCKHAM [birth year] [id number]    2nd outreach attempt to the patient for initial assessment.  No answer. Phone rang seven times and no voicemail pick up.  Unable to leave a message  Plan: [place] will make outreach attempt to the patient within thirty business days.  Lazaro Arms RN, BSN, [place] Direct Dial:  [phone]  Fax: [phone]

## 2019-01-23 NOTE — Progress Notes (Signed)
Office Visit Note   Patient: [name]           Date of Birth: 04-05-1951           MRN: [id number] Visit Date: 01/23/2019              Requested by: [name], DO [place], [place] [id number] PCP: Chipper Herb, MD   Assessment & Plan: Visit Diagnoses:  1. Acute pain of left knee     Plan: Impression is questionable left fibular head and neck fracture.  I cannot find any other evidence to explain her pain.  I recommend symptomatic treatment and activity modification.  She may weight-bear as tolerated.  Follow-up pain 6 weeks for reevaluation of the left knee.  Follow-Up Instructions: Return in about 6 weeks (around 03/06/2019).   Orders:  Orders Placed This Encounter  Procedures  . XR KNEE 3 VIEW LEFT   No orders of the defined types were placed in this encounter.     Procedures: No procedures performed   Clinical Data: No additional findings.   Subjective: Chief Complaint  Patient presents with  . Left Knee - Injury, Pain    [name] is a 68 year old female who comes in for evaluation of acute left knee pain that she sustained on 01/20/2019.  She states that she fell backwards and out of a recliner when she landed on her left leg.  She has had pain on the lateral aspect of the knee since then.  She started walking with a walker today.  She has been to the ER twice since the fall and she continues to complain of the knee but she has not had any imaging.  Denies any numbness and tingling.   Review of Systems  Constitutional: Negative.   HENT: Negative.   Eyes: Negative.   Respiratory: Negative.   Cardiovascular: Negative.   Endocrine: Negative.   Musculoskeletal: Negative.   Neurological: Negative.   Hematological: Negative.   Psychiatric/Behavioral: Negative.   All other systems reviewed and are negative.    Objective: Vital Signs: There were no vitals taken for this visit.  Physical Exam Vitals signs and nursing note  reviewed.  Constitutional:      Appearance: She is well-developed.  HENT:     Head: Normocephalic and atraumatic.  Neck:     Musculoskeletal: Neck supple.  Pulmonary:     Effort: Pulmonary effort is normal.  Abdominal:     Palpations: Abdomen is soft.  Skin:    General: Skin is warm.     Capillary Refill: Capillary refill takes less than 2 seconds.  Neurological:     Mental Status: She is alert and oriented to person, place, and time.  Psychiatric:        Behavior: Behavior normal.        Thought Content: Thought content normal.        Judgment: Judgment normal.     Ortho Exam Left knee exam shows no joint effusion.  Collaterals and cruciates are stable.  Normal range of motion.  Extensor mechanism intact.  She is nontender at the joint line.  She is exquisitely tender to palpation over the fibular head and neck. Specialty Comments:  No specialty comments available.  Imaging: Xr Knee 3 View Left  Result Date: 01/23/2019 Questionable nondisplaced fracture through the fibular head and neck region    PMFS History: Patient Active Problem List   Diagnosis Date Noted  . Anasarca 09/19/2018  .  Acute encephalopathy 06/16/2018  . 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 Acute on chronic diastolic CHF (congestive heart failure) ([place]) 05/10/2018  . Morbid obesity ([place])   . Hypertension   . CKD (chronic kidney disease), stage III ([place])   . Sleep apnea   . Hyperlipidemia   . GERD (gastroesophageal reflux disease)   . Atrial fibrillation ([place])   . S/P TAVR (transcatheter aortic valve replacement)   . Severe aortic stenosis   . Vertigo 05/06/2015  . TIA (transient ischemic attack), history of 11/07/2014  . 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 CKD stage 3 due to type 2 diabetes mellitus ([place]) 06/03/2014  . Type II diabetes mellitus with renal manifestations ([place]) 06/03/2014  . Type II diabetes mellitus with peripheral circulatory disorder ([place]) 06/03/2014  . Benign essential  HTN 12/26/2008  . Coronary artery disease, non-occlusive 12/26/2008  . Pericardial effusion 12/26/2008   Past Medical History:  Diagnosis Date  . Atrial fibrillation ([place])    a. maintaining sinus s/p DCCV, on Eliquis  . CAD (coronary artery disease)    a. Nonobstructive by cath 2006 - 25% LAD, 25-30% after 1st diag, distal 25% PDA, minor irreg LCx. b. Normal nuc 2011.  . CKD (chronic kidney disease), stage III ([place])   . Cystocele   . Difficult intubation 09-07-2012   big neck trouble with intubation parotid surgery"takes little med to sedate"  . Esophageal stricture   . GERD (gastroesophageal reflux disease)   . Hiatal hernia    a. 07/2013: HH with small stricture holding up barium tablet (concides with patient's sx of food sticking).  . History of kidney stones   . Hyperlipidemia   . Hypertension   . IBS (irritable bowel syndrome)   . Memory difficulty 11/26/2015  . Morbid obesity ([place])    a. hx of gastric bypass (sleeve gastrectomy 2015)  . Osteoporosis   . 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  Marland Kitchen Pericardial effusion    a. Mod by echo 2012 at time of PNA. b. Echo 07/2012: small pericardial effusion vs fat.  . S/P TAVR (transcatheter aortic valve replacement)   . Severe aortic stenosis   . Sleep apnea     "have mask; don't use it" (02/14/2018)  . TIA (transient ischemic attack) 10/2014; 06/2016   "some memory issues since; daughter says my speech is sometimes different" (02/14/2018)  . Type II diabetes mellitus ([facility])     Family History  Problem Relation Age of Onset  . Heart failure Mother   . Hypertension Mother   . Skin cancer Mother   . Colitis Mother   . Diabetes Mother   . Kidney Stones Mother   . Stroke Mother   . Heart disease Father   . Colon polyps Father   . Diabetes Father   . Heart failure Father   . Heart attack Sister   . Diabetes Other   . Stroke Maternal Grandfather   . Diabetes Maternal Grandfather   . Colon cancer Neg Hx      Past Surgical History:  Procedure Laterality Date  . BREATH TEK H PYLORI N/A 07/29/2013   Procedure: [place];  Surgeon: Shann Medal, MD;  Location: [facility];  Service: General;  Laterality: N/A;  . CARDIAC CATHETERIZATION  2006  . CARDIOVERSION N/A 02/16/2018   Procedure: CARDIOVERSION;  Surgeon: Pixie Casino, MD;  Location: [place];  Service: Cardiovascular;  Laterality: N/A;  . CARPAL TUNNEL RELEASE Left 2011  .  CATARACT EXTRACTION W/ INTRAOCULAR LENS  IMPLANT, BILATERAL Bilateral 2003   left  . CHOLECYSTECTOMY OPEN  1982  . COLONOSCOPY N/A 07/14/2015   Procedure: COLONOSCOPY;  Surgeon: Ladene Artist, MD;  Location: [facility];  Service: Endoscopy;  Laterality: N/A;  . ESOPHAGOGASTRODUODENOSCOPY N/A 12/27/2013   Procedure: ESOPHAGOGASTRODUODENOSCOPY (EGD);  Surgeon: Shann Medal, MD;  Location: [facility];  Service: General;  Laterality: N/A;  . ESOPHAGOGASTRODUODENOSCOPY (EGD) WITH ESOPHAGEAL DILATION    . INTRAOPERATIVE TRANSTHORACIC ECHOCARDIOGRAM  05/08/2018   Procedure: INTRAOPERATIVE TRANSTHORACIC ECHOCARDIOGRAM;  Surgeon: Burnell Blanks, MD;  Location: [place];  Service: Open Heart Surgery;;  . LAPAROSCOPIC GASTRIC SLEEVE RESECTION N/A 01/20/2014   Procedure: LAPAROSCOPIC GASTRIC SLEEVE RESECTION;  Surgeon: Shann Medal, MD;  Location: [facility];  Service: General;  Laterality: N/A;  . PAROTIDECTOMY  09/07/2012   Procedure: PAROTIDECTOMY;  Surgeon: Melida Quitter, MD;  Location: [place];  Service: ENT;  Laterality: Left;  LEFT PAROTIDECTOMY  . RIGHT HEART CATH N/A 09/25/2018   Procedure: RIGHT HEART CATH;  Surgeon: Larey Dresser, MD;  Location: [facility];  Service: Cardiovascular;  Laterality: N/A;  . RIGHT/LEFT HEART CATH AND CORONARY ANGIOGRAPHY N/A 03/29/2018   Procedure: RIGHT/LEFT HEART CATH AND CORONARY ANGIOGRAPHY;  Surgeon: Burnell Blanks, MD;  Location: [place] [facility];  Service: Cardiovascular;  Laterality: N/A;  . TEE  WITHOUT CARDIOVERSION N/A 02/16/2018   Procedure: TRANSESOPHAGEAL ECHOCARDIOGRAM (TEE);  Surgeon: Pixie Casino, MD;  Location: [facility];  Service: Cardiovascular;  Laterality: N/A;  . TOE AMPUTATION Left 07/2017   great toe; [facility]   . TONSILLECTOMY  1968  . TRANSCATHETER AORTIC VALVE REPLACEMENT, TRANSFEMORAL  05/08/2018   Transcatheter Aortic Valve Replacement   . TRANSCATHETER AORTIC VALVE REPLACEMENT, TRANSFEMORAL N/A 05/08/2018   Procedure: TRANSCATHETER AORTIC VALVE REPLACEMENT, TRANSFEMORAL;  Surgeon: Burnell Blanks, MD;  Location: [place];  Service: Open Heart Surgery;  Laterality: N/A;  . TUBAL LIGATION  yrs ago  . UPPER GI ENDOSCOPY  01/20/2014   Procedure: UPPER GI ENDOSCOPY;  Surgeon: Shann Medal, MD;  Location: [facility];  Service: General;;   Social History   Occupational History  . Occupation: retired    Fish farm manager: [employer]  Tobacco Use  . Smoking status: Never Smoker  . Smokeless tobacco: Never Used  Substance and Sexual Activity  . Alcohol use: Yes    Alcohol/week: 0.0 standard drinks    Comment:  "mixed drink q few years"  . Drug use: Never  . Sexual activity: Yes    Birth control/protection: Post-menopausal

## 2019-01-29 DIAGNOSIS — E1122 Type 2 diabetes mellitus with diabetic chronic kidney disease: Secondary | ICD-10-CM | POA: Diagnosis not present

## 2019-01-29 DIAGNOSIS — E1151 Type 2 diabetes mellitus with diabetic peripheral angiopathy without gangrene: Secondary | ICD-10-CM | POA: Diagnosis not present

## 2019-01-29 DIAGNOSIS — S7002XD Contusion of left hip, subsequent encounter: Secondary | ICD-10-CM | POA: Diagnosis not present

## 2019-01-29 DIAGNOSIS — N183 Chronic kidney disease, stage 3 (moderate): Secondary | ICD-10-CM | POA: Diagnosis not present

## 2019-01-29 DIAGNOSIS — W19XXXD Unspecified fall, subsequent encounter: Secondary | ICD-10-CM | POA: Diagnosis not present

## 2019-01-29 DIAGNOSIS — S83412D Sprain of medial collateral ligament of left knee, subsequent encounter: Secondary | ICD-10-CM | POA: Diagnosis not present

## 2019-01-30 DIAGNOSIS — R0989 Other specified symptoms and signs involving the circulatory and respiratory systems: Secondary | ICD-10-CM | POA: Diagnosis not present

## 2019-01-31 ENCOUNTER — Other Ambulatory Visit: Payer: Self-pay

## 2019-01-31 ENCOUNTER — Encounter: Payer: Self-pay | Admitting: *Deleted

## 2019-01-31 ENCOUNTER — Ambulatory Visit (INDEPENDENT_AMBULATORY_CARE_PROVIDER_SITE_OTHER): Payer: Medicare Other | Admitting: *Deleted

## 2019-01-31 DIAGNOSIS — Z0000 Encounter for general adult medical examination without abnormal findings: Secondary | ICD-10-CM

## 2019-01-31 NOTE — Progress Notes (Addendum)
MEDICARE ANNUAL WELLNESS VISIT  01/31/2019  Telephone Visit Disclaimer This Medicare AWV was conducted by telephone due to national recommendations for restrictions regarding the COVID-19 Pandemic (e.g. social distancing).  I verified, using two identifiers, that I am speaking with [name] or their authorized healthcare agent. I discussed the limitations, risks, security, and privacy concerns of performing an evaluation and management service by telephone and the potential availability of an in-person appointment in the future. The patient expressed understanding and agreed to proceed.   Subjective:  [name] is a 68 y.o. female patient of Chipper Herb, MD who had a Medicare Annual Wellness Visit today via telephone. [place] is retired from working in [employer] fund[employer], and lives with her husband. She has 3 children and 7 grandchildren.  She reports that she is socially active and does interact with friends/family regularly. she is minimally physically active and enjoys spending time with her grandchildren.  She fell 01/08/2019 and has a left leg fracture, she also has a pressure wound that is having trouble healing on her right heel.  She is doing physical therapy once per week for her injuries at home.    Patient Care Team: Chipper Herb, MD as PCP - General (Family Medicine) Minus Breeding, MD as PCP - Cardiology (Cardiology) Melida Quitter, MD as Referring Physician (Otolaryngology) Alphonsa Overall, MD as Consulting Physician (General Surgery) Minus Breeding, MD as Consulting Physician (Cardiology) Williams Che, MD (Inactive) as Consulting Physician (Ophthalmology) Woodroe Mode, MD as Consulting Physician (Obstetrics and Gynecology) Ladene Artist, MD as Consulting Physician (Gastroenterology) Manuela Neptune, DPM as Referring Physician (Podiatry) Larey Dresser, MD as Consulting Physician (Cardiology) Lazaro Arms, RN as [place]  Management Minus Breeding, MD as Consulting Physician (Cardiology) Manuela Neptune, DPM as Referring Physician (Podiatry) Larey Dresser, MD as Consulting Physician (Cardiology)  Advanced Directives 01/31/2019 01/08/2019 11/14/2018 11/12/2018 11/05/2018 10/29/2018 10/25/2018  Does Patient Have a Medical Advance Directive? No No No No No No No  Would patient like information on creating a medical advance directive? Yes (MAU/Ambulatory/Procedural Areas - Information given) - No - Guardian declined No - Patient declined No - Patient declined No - Patient declined No - Patient declined  Pre-existing out of facility DNR order (yellow form or pink MOST form) - - - - - - -    Hospital Utilization Over the Past 12 Months: # of hospitalizations or ER visits: 6 - ER visits/ hospitalizations - for atrial fibrillation, fall, altered mental status, aortic stenosis, and ascities # of surgeries: 1 - aortic valve replacement  Review of Systems    Patient reports that her overall health is worse compared to last year due to multiple health issues.    Review of Systems:  Skin - pressure wound on right heel- reported by patient   Musculoskeletal - positive for left leg pain  All other systems negative - reported by patient  Pain Assessment Pain Score: 3      Current Medications & Allergies (verified) Allergies as of 01/31/2019      Reactions   Amlodipine Swelling   Ankle swelling   Atorvastatin Other (See Comments)   Myalgias   Crestor [rosuvastatin Calcium] Other (See Comments)   Stiffness and back pain   Ezetimibe-simvastatin Other (See Comments)   Leg cramps   Statins Other (See Comments)   Myalgias   Celebrex [celecoxib] Rash   Levemir [insulin Detemir] Rash   Penicillins Other (See Comments)  SYNCOPE (Tolerated Ancef) Has patient had a PCN reaction causing immediate rash, facial/tongue/throat swelling, SOB or lightheadedness with hypotension: Yes Has patient had a PCN reaction  causing severe rash involving mucus membranes or skin necrosis: No Has patient had a PCN reaction that required hospitalization No Has patient had a PCN reaction occurring within the last 10 years: No If all of the above answers are "NO", then may proceed with Cephalosporin use.      Medication List       Accurate as of January 31, 2019 12:50 PM. If you have any questions, ask your nurse or doctor.        Accu-Chek Aviva Plus test strip Generic drug: glucose blood USE TO CHECK BLOOD SUGAR UP TO TWICE DAILY OR AS INSTRUCTED   apixaban 5 MG Tabs tablet Commonly known as: ELIQUIS Take 1 tablet (5 mg total) by mouth 2 (two) times daily.   B-D INS SYR ULTRAFINE 1CC/31G 31G X 5/16" 1 ML Misc Generic drug: Insulin Syringe-Needle U-100 USE TO INJECT INSULIN TWICE A DAY AS INSTRUCTED What changed: See the new instructions.   Calcium Carbonate-Vitamin D 600-400 MG-UNIT tablet Commonly known as: Calcium 600+D Take 1 tablet by mouth 2 (two) times daily. What changed: when to take this   carvedilol 25 MG tablet Commonly known as: COREG Take 1 tablet (25 mg total) by mouth 2 (two) times daily.   clindamycin 300 MG capsule Commonly known as: Cleocin Take 2 tablets (600 mg) one hour prior to dental visits.   hydrALAZINE 10 MG tablet Commonly known as: APRESOLINE Take 1 tablet (10 mg total) by mouth every morning AND 1 tablet (10 mg total) daily with lunch AND 2 tablets (20 mg total) at bedtime.   Icosapent Ethyl 1 g Caps Take 2 capsules (2 g total) by mouth 2 (two) times a day.   Insulin Pen Needle 32G X 4 MM Misc Use to inject insulin with insulin pen   Livalo 4 MG Tabs Generic drug: Pitavastatin Calcium TAKE 1 TABLET BY MOUTH ONCE DAILY   meclizine 12.5 MG tablet Commonly known as: ANTIVERT Take 12.5 mg by mouth 3 (three) times daily as needed for dizziness.   methocarbamol 500 MG tablet Commonly known as: ROBAXIN Take 1 tablet (500 mg total) by mouth 2 (two) times daily.     mupirocin ointment 2 % Commonly known as: Bactroban Apply 1 application topically 2 (two) times daily.   NovoLIN 70/30 ReliOn (70-30) 100 UNIT/ML injection Generic drug: insulin NPH-regular Human Inject 15-65 Units into the skin See admin instructions. Taking 75 units with breakfast and 65 units at lunch and 60 units with dinner.   ondansetron 4 MG disintegrating tablet Commonly known as: Zofran ODT Take 1 tablet (4 mg total) by mouth every 8 (eight) hours as needed for nausea.   prenatal multivitamin Tabs tablet Take 1 tablet by mouth 3 (three) times a week.   sodium chloride 0.65 % Soln nasal spray Commonly known as: OCEAN Place 1 spray into both nostrils as needed for congestion.   Systane Complete 0.6 % Soln Generic drug: Propylene Glycol Apply 1 drop to eye daily as needed (dry eyes).   torsemide 20 MG tablet Commonly known as: DEMADEX Take 1.5 tablets (30 mg total) by mouth every morning AND 1 tablet (20 mg total) at bedtime.   Vitamin D3 50 MCG (2000 UT) Tabs Take 2,000 Units by mouth daily.   vitamin E 400 UNIT capsule Take 400 Units by mouth 3 (three) times  a week.       History (reviewed): Past Medical History:  Diagnosis Date   Atrial fibrillation ([place])    a. maintaining sinus s/p DCCV, on Eliquis   CAD (coronary artery disease)    a. Nonobstructive by cath 2006 - 25% LAD, 25-30% after 1st diag, distal 25% PDA, minor irreg LCx. b. Normal nuc 2011.   CKD (chronic kidney disease), stage III ([place])    Cystocele    Difficult intubation 09-07-2012   big neck trouble with intubation parotid surgery"takes little med to sedate"   Esophageal stricture    GERD (gastroesophageal reflux disease)    Hiatal hernia    a. 07/2013: HH with small stricture holding up barium tablet (concides with patient's sx of food sticking).   History of kidney stones    Hyperlipidemia    Hypertension    IBS (irritable bowel syndrome)    Memory difficulty 11/26/2015    Morbid obesity ([place])    a. 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   Pericardial effusion    a. Mod by echo 2012 at time of PNA. b. Echo 07/2012: small pericardial effusion vs fat.   S/P TAVR (transcatheter aortic valve replacement)    Severe aortic stenosis    Sleep apnea     "have mask; don't use it" (02/14/2018)   TIA (transient ischemic attack) 10/2014; 06/2016   "some memory issues since; daughter says my speech is sometimes different" (02/14/2018)   Type II diabetes mellitus ([place])    Past Surgical History:  Procedure Laterality Date   BREATH TEK H PYLORI N/A 07/29/2013   Procedure: BREATH TEK H PYLORI;  Surgeon: Shann Medal, MD;  Location: [facility];  Service: General;  Laterality: N/A;   CARDIAC CATHETERIZATION  2006   CARDIOVERSION N/A 02/16/2018   Procedure: CARDIOVERSION;  Surgeon: Pixie Casino, MD;  Location[facility] ENDOSCOPY;  Service: Cardiovascular;  Laterality: N/A;   CARPAL TUNNEL RELEASE Left 2011   CATARACT EXTRACTION W/ INTRAOCULAR LENS  IMPLANT, BILATERAL Bilateral 2003   left   CHOLECYSTECTOMY OPEN  1982   COLONOSCOPY N/A 07/14/2015   Procedure: COLONOSCOPY;  Surgeon: Ladene Artist, MD;  Location: [facility] ENDOSCOPY;  Service: Endoscopy;  Laterality: N/A;   ESOPHAGOGASTRODUODENOSCOPY N/A 12/27/2013   Procedure: ESOPHAGOGASTRODUODENOSCOPY (EGD);  Surgeon: Shann Medal, MD;  Location: [facility];  Service: General;  Laterality: N/A;   ESOPHAGOGASTRODUODENOSCOPY (EGD) WITH ESOPHAGEAL DILATION     INTRAOPERATIVE TRANSTHORACIC ECHOCARDIOGRAM  05/08/2018   Procedure: INTRAOPERATIVE TRANSTHORACIC ECHOCARDIOGRAM;  Surgeon: Burnell Blanks, MD;  Location: [place];  Service: Open Heart Surgery;;   LAPAROSCOPIC GASTRIC SLEEVE RESECTION N/A 01/20/2014   Procedure: LAPAROSCOPIC GASTRIC SLEEVE RESECTION;  Surgeon: Shann Medal, MD;  Location: [facility] ORS;  Service: General;   Laterality: N/A;   PAROTIDECTOMY  09/07/2012   Procedure: PAROTIDECTOMY;  Surgeon: Melida Quitter, MD;  Location: [place];  Service: ENT;  Laterality: Left;  LEFT PAROTIDECTOMY   RIGHT HEART CATH N/A 09/25/2018   Procedure: RIGHT HEART CATH;  Surgeon: Larey Dresser, MD;  Location: [facility] CV LAB;  Service: Cardiovascular;  Laterality: N/A;   RIGHT/LEFT HEART CATH AND CORONARY ANGIOGRAPHY N/A 03/29/2018   Procedure: RIGHT/LEFT HEART CATH AND CORONARY ANGIOGRAPHY;  Surgeon: Burnell Blanks, MD;  Location: [place] CV LAB;  Service: Cardiovascular;  Laterality: N/A;   TEE WITHOUT CARDIOVERSION N/A 02/16/2018   Procedure: TRANSESOPHAGEAL ECHOCARDIOGRAM (  TEE);  Surgeon: Pixie Casino, MD;  Location: [facility] ENDOSCOPY;  Service: Cardiovascular;  Laterality: N/A;   TOE AMPUTATION Left 07/2017   great toe; [facility]    TONSILLECTOMY  1968   TRANSCATHETER AORTIC VALVE REPLACEMENT, TRANSFEMORAL  05/08/2018   Transcatheter Aortic Valve Replacement    TRANSCATHETER AORTIC VALVE REPLACEMENT, TRANSFEMORAL N/A 05/08/2018   Procedure: TRANSCATHETER AORTIC VALVE REPLACEMENT, TRANSFEMORAL;  Surgeon: Burnell Blanks, MD;  Location: [place];  Service: Open Heart Surgery;  Laterality: N/A;   TUBAL LIGATION  yrs ago   UPPER GI ENDOSCOPY  01/20/2014   Procedure: UPPER GI ENDOSCOPY;  Surgeon: Shann Medal, MD;  Location: [facility] ORS;  Service: General;;   Family History  Problem Relation Age of Onset   Heart failure Mother    Hypertension Mother    Skin cancer Mother    Colitis Mother    Diabetes Mother    Kidney Stones Mother    Stroke Mother    Heart disease Father    Colon polyps Father    Diabetes Father    Heart failure Father    Heart attack Sister    Diabetes Other    Stroke Maternal Grandfather    Diabetes Maternal Grandfather    Colon cancer Neg Hx    Social History   Socioeconomic History   Marital status: Married    Spouse name: Not on file   Number of  children: 3   Years of education: some college   Highest education level: Some college, no degree  Occupational History   Occupation: retired    Fish farm manager: [place] resource strain: Not hard at all   Food insecurity    Worry: Never true    Inability: Never true   Transportation needs    Medical: No    Non-medical: No  Tobacco Use   Smoking status: Never Smoker   Smokeless tobacco: Never Used  Substance and Sexual Activity   Alcohol use: Yes    Alcohol/week: 0.0 standard drinks    Comment:  "mixed drink q few years"   Drug use: Never   Sexual activity: Yes    Birth control/protection: Post-menopausal  Lifestyle   Physical activity    Days per week: 0 days    Minutes per session: 0 min   Stress: Only a little  Relationships   Social connections    Talks on phone: More than three times a week    Gets together: More than three times a week    Attends religious service: More than 4 times per year    Active member of club or organization: Yes    Attends meetings of clubs or organizations: More than 4 times per year    Relationship status: Married  Other Topics Concern   Not on file  Social History Narrative   Married   Patient is right handed.   Patient rarely drinks caffeine.   Lives at home with husband in two story home. Husband washes laundry in the basement. They have 3 daughters and 7 grandchildren.     Activities of Daily Living In your present state of health, do you have any difficulty performing the following activities: 01/31/2019 09/19/2018  Hearing? Y N  Comment decreased hearing, not interested in hearing evaluation at this time -  Vision? N N  Difficulty concentrating or making decisions? N N  Walking or climbing stairs? Y N  Comment uses wheelchair currently due to broken leg -  Dressing or bathing? Y N  Comment Family helps -  Doing errands, shopping? Y N  Comment Family provides transportation currently  due to broken leg -  Preparing Food and eating ? N -  Using the Toilet? N -  In the past six months, have you accidently leaked urine? Y -  Comment occassional urine leaking -  Do you have problems with loss of bowel control? N -  Managing your Medications? N -  Managing your Finances? N -  Housekeeping or managing your Housekeeping? Y -  Comment Husband helps -  Some recent data might be hidden    Patient Literacy    Exercise Current Exercise Habits: The patient does not participate in regular exercise at present, Exercise limited by: orthopedic condition(s);cardiac condition(s)  Diet Patient reports consuming 3 meals a day and 1 snack(s) a day Patient reports that her primary diet is: Regular Patient reports that she does have regular access to food.   Depression Screen PHQ 2/9 Scores 01/31/2019 01/15/2019 11/14/2018 11/12/2018 11/05/2018 10/29/2018 10/25/2018  PHQ - 2 Score 0 0 0 0 0 0 0  PHQ- 9 Score - 0 - - - - -     Fall Risk Fall Risk  01/31/2019 01/15/2019 10/15/2018 10/05/2018 10/05/2018  Falls in the past year? 1 1 0 0 0  Number falls in past yr: 0 1 0 - -  Injury with Fall? 1 1 0 - -  Comment - - - - -  Follow up Education provided;Falls prevention discussed - - - -     Objective:  Karmin P Swift seemed alert and oriented and she participated appropriately during our telephone visit.  Blood Pressure Weight BMI  BP Readings from Last 3 Encounters:  01/15/19 113/61  01/09/19 122/60  11/12/18 (!) 122/52   Wt Readings from Last 3 Encounters:  01/15/19 200 lb (90.7 kg)  01/08/19 204 lb (92.5 kg)  11/12/18 203 lb (92.1 kg)   BMI Readings from Last 1 Encounters:  01/15/19 31.32 kg/m    *Unable to obtain current vital signs, weight, and BMI due to telephone visit type  Hearing/Vision   Mairany did not seem to have difficulty with hearing/understanding during the telephone conversation  Reports that she has not had a formal eye exam by an eye care professional within  the past year- has appointment tomorrow  Reports that she has not had a formal hearing evaluation within the past year *Unable to fully assess hearing and vision during telephone visit type  Cognitive Function: 6CIT Screen 01/31/2019  What Year? 0 points  What month? 0 points  What time? 0 points  Count back from 20 0 points  Months in reverse 0 points  Repeat phrase 0 points  Total Score 0   (Normal:0-7, Significant for Dysfunction: >8)  Normal Cognitive Function Screening: Yes   Immunization & Health Maintenance Record Immunization History  Administered Date(s) Administered   Influenza Split 05/16/2011   Influenza Whole 05/15/2010   Influenza, High Dose Seasonal PF 05/09/2016, 07/10/2017, 05/09/2018   Influenza,inj,Quad PF,6+ Mos 05/22/2013, 06/03/2014, 05/14/2015   Influenza-Unspecified 05/15/2016   Pneumococcal Conjugate-13 05/09/2016, 08/07/2017   Pneumococcal Polysaccharide-23 01/21/2014   Tdap 04/26/2011    Health Maintenance  Topic Date Due   DEXA SCAN  09/26/2018   OPHTHALMOLOGY EXAM  12/16/2018   PNA vac Low Risk Adult (2 of 2 - PPSV23) 01/22/2019   INFLUENZA VACCINE  03/16/2019   HEMOGLOBIN A1C  03/20/2019   FOOT EXAM  04/19/2019  MAMMOGRAM  10/17/2019   COLONOSCOPY  07/13/2020   TETANUS/TDAP  04/25/2021   Hepatitis C Screening  Completed       Assessment  This is a routine wellness examination for [name].  Health Maintenance: Due or Overdue Health Maintenance Due  Topic Date Due   DEXA SCAN  09/26/2018   OPHTHALMOLOGY EXAM  12/16/2018   PNA vac Low Risk Adult (2 of 2 - PPSV23) 01/22/2019   Recommend dexa scan and pneumovax 23 at next in person office visit  Patient is scheduled for eye exam tomorrow 02/01/2019.  Ethel P Ibanez does not need a referral for [employer] Assistance: Care Management:   no Social Work:    no Prescription Assistance:  no Nutrition/Diabetes Education:  no   Plan:  Personalized  Goals Goals Addressed            This Visit's Progress    Exercise 150 min/wk Moderate Activity   Not on track    Chair exercises daily. See handout      Personalized Health Maintenance & Screening Recommendations  Pneumococcal vaccine  Bone densitometry screening Advanced directives: has NO advanced directive  - add't info requested. Referral to SW: no  Shingrix vaccine series  Lung Cancer Screening Recommended: no (Low Dose CT Chest recommended if Age 62-80 years, 30 pack-year currently smoking OR have quit w/in past 15 years) Hepatitis C Screening recommended: completed 05/06/2015   Advanced Directives: Written information was prepared per patient's request.  Referrals & Orders No orders of the defined types were placed in this encounter.   Follow-up Plan  Follow-up with Chipper Herb, MD as planned  Work on goal of doing chair exercises daily for 30 minutes  Keep appointments with specialists    I have personally reviewed and noted the following in the patients chart:    Medical and social history  Use of alcohol, tobacco or illicit drugs   Current medications and supplements  Functional ability and status  Nutritional status  Physical activity  Advanced directives  List of other physicians  Hospitalizations, surgeries, and ER visits in previous 12 months  Vitals  Screenings to include cognitive, depression, and falls  Referrals and appointments  In addition, I have reviewed and discussed with Tabathia Wynelle [name] certain preventive protocols, quality metrics, and best practice recommendations. A written personalized care plan for preventive services as well as general preventive health recommendations is available and can be mailed to the patient at her request.      Nolberto Hanlon, RN  01/31/2019   I have reviewed and agree with the above AWV documentation.   Evelina Dun, FNP

## 2019-01-31 NOTE — Patient Instructions (Signed)
  [name] , Thank you for taking time to talk with me for your Medicare Wellness Visit. I appreciate your ongoing commitment to your health goals. Please review the following plan we discussed and let me know if I can assist you in the future.   These are the goals we discussed: Goals    . Exercise 150 min/wk Moderate Activity     Chair exercises daily. See handout       This is a list of the screening recommended for you and due dates:  Health Maintenance  Topic Date Due  . DEXA scan (bone density measurement)  09/26/2018  . Eye exam for diabetics  12/16/2018  . Pneumonia vaccines (2 of 2 - PPSV23) 01/22/2019  . Flu Shot  03/16/2019  . Hemoglobin A1C  03/20/2019  . Complete foot exam   04/19/2019  . Mammogram  10/17/2019  . Colon Cancer Screening  07/13/2020  . Tetanus Vaccine  04/25/2021  .  Hepatitis C: One time screening is recommended by Center for Disease Control  (CDC) for  adults born from [birth year] through 1965.   Completed

## 2019-02-01 DIAGNOSIS — H10013 Acute follicular conjunctivitis, bilateral: Secondary | ICD-10-CM | POA: Diagnosis not present

## 2019-02-05 DIAGNOSIS — S83412D Sprain of medial collateral ligament of left knee, subsequent encounter: Secondary | ICD-10-CM | POA: Diagnosis not present

## 2019-02-05 DIAGNOSIS — N183 Chronic kidney disease, stage 3 (moderate): Secondary | ICD-10-CM | POA: Diagnosis not present

## 2019-02-05 DIAGNOSIS — S7002XD Contusion of left hip, subsequent encounter: Secondary | ICD-10-CM | POA: Diagnosis not present

## 2019-02-05 DIAGNOSIS — E1151 Type 2 diabetes mellitus with diabetic peripheral angiopathy without gangrene: Secondary | ICD-10-CM | POA: Diagnosis not present

## 2019-02-05 DIAGNOSIS — W19XXXD Unspecified fall, subsequent encounter: Secondary | ICD-10-CM | POA: Diagnosis not present

## 2019-02-05 DIAGNOSIS — E1122 Type 2 diabetes mellitus with diabetic chronic kidney disease: Secondary | ICD-10-CM | POA: Diagnosis not present

## 2019-02-06 ENCOUNTER — Other Ambulatory Visit: Payer: Self-pay

## 2019-02-06 ENCOUNTER — Ambulatory Visit (INDEPENDENT_AMBULATORY_CARE_PROVIDER_SITE_OTHER): Payer: Medicare Other

## 2019-02-06 DIAGNOSIS — S7002XD Contusion of left hip, subsequent encounter: Secondary | ICD-10-CM | POA: Diagnosis not present

## 2019-02-06 DIAGNOSIS — I2510 Atherosclerotic heart disease of native coronary artery without angina pectoris: Secondary | ICD-10-CM

## 2019-02-06 DIAGNOSIS — I4891 Unspecified atrial fibrillation: Secondary | ICD-10-CM | POA: Diagnosis not present

## 2019-02-06 DIAGNOSIS — L97509 Non-pressure chronic ulcer of other part of unspecified foot with unspecified severity: Secondary | ICD-10-CM | POA: Diagnosis not present

## 2019-02-06 DIAGNOSIS — N183 Chronic kidney disease, stage 3 (moderate): Secondary | ICD-10-CM

## 2019-02-06 DIAGNOSIS — W19XXXD Unspecified fall, subsequent encounter: Secondary | ICD-10-CM

## 2019-02-06 DIAGNOSIS — S83412D Sprain of medial collateral ligament of left knee, subsequent encounter: Secondary | ICD-10-CM

## 2019-02-06 DIAGNOSIS — I10 Essential (primary) hypertension: Secondary | ICD-10-CM

## 2019-02-06 DIAGNOSIS — I739 Peripheral vascular disease, unspecified: Secondary | ICD-10-CM | POA: Diagnosis not present

## 2019-02-06 DIAGNOSIS — E1142 Type 2 diabetes mellitus with diabetic polyneuropathy: Secondary | ICD-10-CM | POA: Diagnosis not present

## 2019-02-06 DIAGNOSIS — E1151 Type 2 diabetes mellitus with diabetic peripheral angiopathy without gangrene: Secondary | ICD-10-CM

## 2019-02-06 DIAGNOSIS — Z794 Long term (current) use of insulin: Secondary | ICD-10-CM | POA: Diagnosis not present

## 2019-02-06 DIAGNOSIS — E1122 Type 2 diabetes mellitus with diabetic chronic kidney disease: Secondary | ICD-10-CM

## 2019-02-06 DIAGNOSIS — L97412 Non-pressure chronic ulcer of right heel and midfoot with fat layer exposed: Secondary | ICD-10-CM | POA: Diagnosis not present

## 2019-02-06 DIAGNOSIS — L97529 Non-pressure chronic ulcer of other part of left foot with unspecified severity: Secondary | ICD-10-CM | POA: Diagnosis not present

## 2019-02-08 DIAGNOSIS — I739 Peripheral vascular disease, unspecified: Secondary | ICD-10-CM | POA: Diagnosis not present

## 2019-02-08 DIAGNOSIS — L97412 Non-pressure chronic ulcer of right heel and midfoot with fat layer exposed: Secondary | ICD-10-CM | POA: Diagnosis not present

## 2019-02-08 DIAGNOSIS — E1151 Type 2 diabetes mellitus with diabetic peripheral angiopathy without gangrene: Secondary | ICD-10-CM | POA: Diagnosis not present

## 2019-02-08 DIAGNOSIS — R0989 Other specified symptoms and signs involving the circulatory and respiratory systems: Secondary | ICD-10-CM | POA: Diagnosis not present

## 2019-02-12 ENCOUNTER — Telehealth: Payer: Self-pay | Admitting: *Deleted

## 2019-02-12 DIAGNOSIS — S7002XD Contusion of left hip, subsequent encounter: Secondary | ICD-10-CM | POA: Diagnosis not present

## 2019-02-12 DIAGNOSIS — N183 Chronic kidney disease, stage 3 (moderate): Secondary | ICD-10-CM | POA: Diagnosis not present

## 2019-02-12 DIAGNOSIS — E1122 Type 2 diabetes mellitus with diabetic chronic kidney disease: Secondary | ICD-10-CM | POA: Diagnosis not present

## 2019-02-12 DIAGNOSIS — E1151 Type 2 diabetes mellitus with diabetic peripheral angiopathy without gangrene: Secondary | ICD-10-CM | POA: Diagnosis not present

## 2019-02-12 DIAGNOSIS — S83412D Sprain of medial collateral ligament of left knee, subsequent encounter: Secondary | ICD-10-CM | POA: Diagnosis not present

## 2019-02-12 DIAGNOSIS — W19XXXD Unspecified fall, subsequent encounter: Secondary | ICD-10-CM | POA: Diagnosis not present

## 2019-02-13 DIAGNOSIS — Z794 Long term (current) use of insulin: Secondary | ICD-10-CM | POA: Diagnosis not present

## 2019-02-13 DIAGNOSIS — E1151 Type 2 diabetes mellitus with diabetic peripheral angiopathy without gangrene: Secondary | ICD-10-CM | POA: Diagnosis not present

## 2019-02-13 DIAGNOSIS — N183 Chronic kidney disease, stage 3 (moderate): Secondary | ICD-10-CM | POA: Diagnosis not present

## 2019-02-13 DIAGNOSIS — S83412D Sprain of medial collateral ligament of left knee, subsequent encounter: Secondary | ICD-10-CM | POA: Diagnosis not present

## 2019-02-13 DIAGNOSIS — E1122 Type 2 diabetes mellitus with diabetic chronic kidney disease: Secondary | ICD-10-CM | POA: Diagnosis not present

## 2019-02-13 DIAGNOSIS — S7002XD Contusion of left hip, subsequent encounter: Secondary | ICD-10-CM | POA: Diagnosis not present

## 2019-02-13 DIAGNOSIS — I10 Essential (primary) hypertension: Secondary | ICD-10-CM | POA: Diagnosis not present

## 2019-02-13 DIAGNOSIS — W19XXXD Unspecified fall, subsequent encounter: Secondary | ICD-10-CM | POA: Diagnosis not present

## 2019-02-13 DIAGNOSIS — I4891 Unspecified atrial fibrillation: Secondary | ICD-10-CM | POA: Diagnosis not present

## 2019-02-13 DIAGNOSIS — I2510 Atherosclerotic heart disease of native coronary artery without angina pectoris: Secondary | ICD-10-CM | POA: Diagnosis not present

## 2019-02-14 ENCOUNTER — Encounter (HOSPITAL_COMMUNITY): Payer: Medicare Other | Admitting: Cardiology

## 2019-02-14 NOTE — Telephone Encounter (Signed)
[name] left message with Rip Harbour - all paper work was faxed to her 02/12/19.

## 2019-02-20 DIAGNOSIS — I509 Heart failure, unspecified: Secondary | ICD-10-CM | POA: Diagnosis not present

## 2019-02-20 DIAGNOSIS — Z794 Long term (current) use of insulin: Secondary | ICD-10-CM | POA: Diagnosis not present

## 2019-02-20 DIAGNOSIS — I7092 Chronic total occlusion of artery of the extremities: Secondary | ICD-10-CM | POA: Diagnosis not present

## 2019-02-20 DIAGNOSIS — E1151 Type 2 diabetes mellitus with diabetic peripheral angiopathy without gangrene: Secondary | ICD-10-CM | POA: Diagnosis not present

## 2019-02-20 DIAGNOSIS — N183 Chronic kidney disease, stage 3 (moderate): Secondary | ICD-10-CM | POA: Diagnosis not present

## 2019-02-20 DIAGNOSIS — Z8673 Personal history of transient ischemic attack (TIA), and cerebral infarction without residual deficits: Secondary | ICD-10-CM | POA: Diagnosis not present

## 2019-02-20 DIAGNOSIS — M1990 Unspecified osteoarthritis, unspecified site: Secondary | ICD-10-CM | POA: Diagnosis not present

## 2019-02-20 DIAGNOSIS — E785 Hyperlipidemia, unspecified: Secondary | ICD-10-CM | POA: Diagnosis not present

## 2019-02-20 DIAGNOSIS — I70235 Atherosclerosis of native arteries of right leg with ulceration of other part of foot: Secondary | ICD-10-CM | POA: Diagnosis not present

## 2019-02-20 DIAGNOSIS — Z79899 Other long term (current) drug therapy: Secondary | ICD-10-CM | POA: Diagnosis not present

## 2019-02-20 DIAGNOSIS — I2510 Atherosclerotic heart disease of native coronary artery without angina pectoris: Secondary | ICD-10-CM | POA: Diagnosis not present

## 2019-02-20 DIAGNOSIS — L97412 Non-pressure chronic ulcer of right heel and midfoot with fat layer exposed: Secondary | ICD-10-CM | POA: Diagnosis not present

## 2019-02-20 DIAGNOSIS — E1122 Type 2 diabetes mellitus with diabetic chronic kidney disease: Secondary | ICD-10-CM | POA: Diagnosis not present

## 2019-02-20 DIAGNOSIS — K219 Gastro-esophageal reflux disease without esophagitis: Secondary | ICD-10-CM | POA: Diagnosis not present

## 2019-02-20 DIAGNOSIS — Z7901 Long term (current) use of anticoagulants: Secondary | ICD-10-CM | POA: Diagnosis not present

## 2019-02-20 DIAGNOSIS — Z7902 Long term (current) use of antithrombotics/antiplatelets: Secondary | ICD-10-CM | POA: Diagnosis not present

## 2019-02-20 DIAGNOSIS — Z952 Presence of prosthetic heart valve: Secondary | ICD-10-CM | POA: Diagnosis not present

## 2019-02-20 DIAGNOSIS — I70234 Atherosclerosis of native arteries of right leg with ulceration of heel and midfoot: Secondary | ICD-10-CM | POA: Diagnosis not present

## 2019-02-20 DIAGNOSIS — I130 Hypertensive heart and chronic kidney disease with heart failure and stage 1 through stage 4 chronic kidney disease, or unspecified chronic kidney disease: Secondary | ICD-10-CM | POA: Diagnosis not present

## 2019-02-20 DIAGNOSIS — Z7982 Long term (current) use of aspirin: Secondary | ICD-10-CM | POA: Diagnosis not present

## 2019-02-20 DIAGNOSIS — L97519 Non-pressure chronic ulcer of other part of right foot with unspecified severity: Secondary | ICD-10-CM | POA: Diagnosis not present

## 2019-02-20 DIAGNOSIS — Z886 Allergy status to analgesic agent status: Secondary | ICD-10-CM | POA: Diagnosis not present

## 2019-02-20 DIAGNOSIS — I739 Peripheral vascular disease, unspecified: Secondary | ICD-10-CM | POA: Diagnosis not present

## 2019-02-20 DIAGNOSIS — Z888 Allergy status to other drugs, medicaments and biological substances status: Secondary | ICD-10-CM | POA: Diagnosis not present

## 2019-02-21 ENCOUNTER — Other Ambulatory Visit: Payer: Self-pay

## 2019-02-21 DIAGNOSIS — E1151 Type 2 diabetes mellitus with diabetic peripheral angiopathy without gangrene: Secondary | ICD-10-CM | POA: Diagnosis not present

## 2019-02-21 DIAGNOSIS — N183 Chronic kidney disease, stage 3 (moderate): Secondary | ICD-10-CM | POA: Diagnosis not present

## 2019-02-21 DIAGNOSIS — W19XXXD Unspecified fall, subsequent encounter: Secondary | ICD-10-CM | POA: Diagnosis not present

## 2019-02-21 DIAGNOSIS — S83412D Sprain of medial collateral ligament of left knee, subsequent encounter: Secondary | ICD-10-CM | POA: Diagnosis not present

## 2019-02-21 DIAGNOSIS — S7002XD Contusion of left hip, subsequent encounter: Secondary | ICD-10-CM | POA: Diagnosis not present

## 2019-02-21 DIAGNOSIS — E1122 Type 2 diabetes mellitus with diabetic chronic kidney disease: Secondary | ICD-10-CM | POA: Diagnosis not present

## 2019-02-21 NOTE — Patient Outreach (Signed)
[facility]) Care Management  02/21/2019   [name] 24-Jun-1951 [id number]      Outreach attempt # 3rd for initial assessment.  HIPAA provided.  Patient was able to complete the assessment.  Social: The patient lives in the home with her husband.  She has a daughter that stays next door to her and she is a Marine scientist.  Both the husband and daughter are very supportive.  She denies any pain but has had two falls this year back to back.  She fell in the bathroom and did not injure herself then she went in her living room and went to sit down missed the chair, her leg bent the wrong way.  She fractured the bone.  Discussed fall precautions and she verbalized understanding.  Her family takes her to appointments.  Equipment in the home consists of : wheelchair, walker, [employer], bedside commode shower chair, and eyeglasses.  Conditions: Per chart review that patient's conditions include:  HTN, CAD, Type II diabetes,A-Fib, CHF, Stroke,GERD, CKD stage III,and Hyperlipidemia.  The patient states that she checks her blood sugar four times or more a day.  She state she sees Dr. Soyla Murphy for her diabetes and she was recently taken off metformin due to its effect to the kidneys.  She said her insulin Novolin 70/30 was adjusted.  She feels that it is not enough.  She said her fasting blood sugar this morning was 300.  She states she is going to call the doctor to see what else she can take.  Advised her to make sure to contact them today.  She verbalized understanding.  She states that her husband is learning to cook and some times they get food from the diner close by.  We discussed diet and she verbalized understanding.  She is unable to weigh and do exercises due to her foot and her.  Discussed about doing some chair exercises.  She verbalized understanding.  Medications:Per chart review and speaking with the patient she is on fourteen medications.  She states that her husband manages her  meds and she does not have any problems paying for medication.  Appointments: She has an appointment with Dr. Erlinda Hong 7/22  and Dr. Aundra Dubin 7/30.  Advanced Directives:The patient does not have an advanced directive but would like for me to send her information.   Current Medications:  Current Outpatient Medications  Medication Sig Dispense Refill  . ACCU-CHEK AVIVA PLUS test strip USE TO CHECK BLOOD SUGAR UP TO TWICE DAILY OR AS INSTRUCTED 100 each 2  . apixaban (ELIQUIS) 5 MG TABS tablet Take 1 tablet (5 mg total) by mouth 2 (two) times daily. 180 tablet 3  . B-D INS SYR ULTRAFINE 1CC/31G 31G X 5/16" 1 ML MISC USE TO INJECT INSULIN TWICE A DAY AS INSTRUCTED (Patient taking differently: 1 Stick by Other route 3 (three) times daily before meals. ) 100 each 2  . Calcium Carbonate-Vitamin D (CALCIUM 600+D) 600-400 MG-UNIT per tablet Take 1 tablet by mouth 2 (two) times daily. (Patient taking differently: Take 1 tablet by mouth 3 (three) times a week. )    . carvedilol (COREG) 25 MG tablet Take 1 tablet (25 mg total) by mouth 2 (two) times daily. 60 tablet 11  . Cholecalciferol (VITAMIN D3) 50 MCG (2000 UT) TABS Take 2,000 Units by mouth daily.    . hydrALAZINE (APRESOLINE) 10 MG tablet Take 1 tablet (10 mg total) by mouth every morning AND 1 tablet (10 mg total) daily  with lunch AND 2 tablets (20 mg total) at bedtime. 360 tablet 0  . Insulin Pen Needle 32G X 4 MM MISC Use to inject insulin with insulin pen 100 each 4  . LIVALO 4 MG TABS TAKE 1 TABLET BY MOUTH ONCE DAILY 90 tablet 1  . meclizine (ANTIVERT) 12.5 MG tablet Take 12.5 mg by mouth 3 (three) times daily as needed for dizziness.    . mupirocin ointment (BACTROBAN) 2 % Apply 1 application topically 2 (two) times daily. 22 g 0  . NOVOLIN 70/30 RELION (70-30) 100 UNIT/ML injection Inject 15-65 Units into the skin See admin instructions. Taking 75 units with breakfast and 65 units at lunch and 60 units with dinner.    . Prenatal Vit-Fe Fumarate-FA  (PRENATAL MULTIVITAMIN) TABS tablet Take 1 tablet by mouth 3 (three) times a week.    Marland Kitchen Propylene Glycol (SYSTANE COMPLETE) 0.6 % SOLN Apply 1 drop to eye daily as needed (dry eyes).     . sodium chloride (OCEAN) 0.65 % SOLN nasal spray Place 1 spray into both nostrils as needed for congestion. 15 mL 0  . torsemide (DEMADEX) 20 MG tablet Take 1.5 tablets (30 mg total) by mouth every morning AND 1 tablet (20 mg total) at bedtime. 225 tablet 0  . clindamycin (CLEOCIN) 300 MG capsule Take 2 tablets (600 mg) one hour prior to dental visits. (Patient not taking: Reported on 01/15/2019) 6 capsule 3  . Icosapent Ethyl 1 g CAPS Take 2 capsules (2 g total) by mouth 2 (two) times a day. (Patient not taking: Reported on 01/15/2019) 120 capsule 6  . methocarbamol (ROBAXIN) 500 MG tablet Take 1 tablet (500 mg total) by mouth 2 (two) times daily. (Patient not taking: Reported on 01/15/2019) 20 tablet 0  . ondansetron (ZOFRAN ODT) 4 MG disintegrating tablet Take 1 tablet (4 mg total) by mouth every 8 (eight) hours as needed for nausea. (Patient not taking: Reported on 01/15/2019) 10 tablet 0  . vitamin E 400 UNIT capsule Take 400 Units by mouth 3 (three) times a week.      No current facility-administered medications for this visit.     Functional Status:  In your present state of health, do you have any difficulty performing the following activities: 02/21/2019 01/31/2019  Hearing? N Y  Comment decreased hearing decline hearing aid decreased hearing, not interested in hearing evaluation at this time  Vision? N N  Difficulty concentrating or making decisions? N N  Walking or climbing stairs? Y Y  Comment due to wound on right foot uses wheelchair currently due to broken leg  Dressing or bathing? Tempie Donning  Comment family Family helps  Doing errands, shopping? Y Y  Comment family provides transpotation Family provides transportation currently due to broken leg  Preparing Food and eating ? - N  Using the Toilet? - N  In  the past six months, have you accidently leaked urine? - Y  Comment - occassional urine leaking  Do you have problems with loss of bowel control? - N  Managing your Medications? - N  Managing your Finances? - N  Housekeeping or managing your Housekeeping? - Y  Comment - Husband helps  Some recent data might be hidden    Fall/Depression Screening: Fall Risk  02/21/2019 01/31/2019 01/15/2019  Falls in the past year? 1 1 1   Number falls in past yr: 1 0 1  Injury with Fall? 1 1 1   Comment pulled something in bag and leg - -  Risk for fall due to : Impaired balance/gait - -  Follow up Education provided Education provided;Falls prevention discussed -   PHQ 2/9 Scores 02/21/2019 01/31/2019 01/15/2019 11/14/2018 11/12/2018 11/05/2018 10/29/2018  PHQ - 2 Score 0 0 0 0 0 0 0  PHQ- 9 Score - - 0 - - - -    Assessment: Patient will benefit from health coach outreach for disease management and support.  THN CM Care Plan Problem One     Most Recent Value  Care Plan Problem One  Knowledge deficit of [place] management a evidenced by a1c 10..3  Role Documenting the Problem One  [place] for Problem One  Active  THN Long Term Goal   In 30 day the patient will lower her a1c 1-2 points  [facility] Long Term Goal Start Date  02/21/19       Plan: RN Health Coach will provide ongoing education for patient on diabetes through phone calls and sending printed information to patient for further discussion.  RN Health Coach will send Booklet on heart failure.  RN Health Coach will send initial barriers letter, assessment, and care plan to primary care physician.  [place] will send Consent form.  RN Health Coach will contact patient in the month of August and patient agrees to next outreach.   Lazaro Arms RN, BSN, [place] Direct Dial:  [phone]  Fax: [phone]

## 2019-02-26 ENCOUNTER — Telehealth: Payer: Self-pay

## 2019-02-26 DIAGNOSIS — S83412D Sprain of medial collateral ligament of left knee, subsequent encounter: Secondary | ICD-10-CM | POA: Diagnosis not present

## 2019-02-26 DIAGNOSIS — W19XXXD Unspecified fall, subsequent encounter: Secondary | ICD-10-CM | POA: Diagnosis not present

## 2019-02-26 DIAGNOSIS — E1122 Type 2 diabetes mellitus with diabetic chronic kidney disease: Secondary | ICD-10-CM | POA: Diagnosis not present

## 2019-02-26 DIAGNOSIS — E1151 Type 2 diabetes mellitus with diabetic peripheral angiopathy without gangrene: Secondary | ICD-10-CM | POA: Diagnosis not present

## 2019-02-26 DIAGNOSIS — N183 Chronic kidney disease, stage 3 (moderate): Secondary | ICD-10-CM | POA: Diagnosis not present

## 2019-02-26 DIAGNOSIS — S7002XD Contusion of left hip, subsequent encounter: Secondary | ICD-10-CM | POA: Diagnosis not present

## 2019-02-26 NOTE — Telephone Encounter (Signed)
S/w pt she states that she had Bilat LE DVT surgery recently with  Pauline Good, MD ([facility]) 02/26/2019 Phone: [phone]; Fax: [phone]. Pt states that she does not want to do virtual visit she would like OV added to wait list.

## 2019-02-27 ENCOUNTER — Other Ambulatory Visit: Payer: Self-pay | Admitting: Physician Assistant

## 2019-02-27 NOTE — Telephone Encounter (Signed)
This is Dr. Hochrein's pt. °

## 2019-03-06 ENCOUNTER — Encounter: Payer: Self-pay | Admitting: Orthopaedic Surgery

## 2019-03-06 ENCOUNTER — Other Ambulatory Visit (HOSPITAL_COMMUNITY): Payer: Self-pay | Admitting: Cardiology

## 2019-03-06 ENCOUNTER — Ambulatory Visit (INDEPENDENT_AMBULATORY_CARE_PROVIDER_SITE_OTHER): Payer: Medicare Other

## 2019-03-06 ENCOUNTER — Ambulatory Visit (INDEPENDENT_AMBULATORY_CARE_PROVIDER_SITE_OTHER): Payer: Medicare Other | Admitting: Orthopaedic Surgery

## 2019-03-06 ENCOUNTER — Other Ambulatory Visit: Payer: Self-pay

## 2019-03-06 DIAGNOSIS — M25562 Pain in left knee: Secondary | ICD-10-CM | POA: Diagnosis not present

## 2019-03-06 DIAGNOSIS — I2510 Atherosclerotic heart disease of native coronary artery without angina pectoris: Secondary | ICD-10-CM | POA: Diagnosis not present

## 2019-03-06 NOTE — Progress Notes (Signed)
Office Visit Note   Patient: [name]           Date of Birth: Feb 11, 1951           MRN: [id number] Visit Date: 03/06/2019              Requested by: Chipper Herb, MD [address] [place],  [place] [address] PCP: [name], DO   Assessment & Plan: Visit Diagnoses:  1. Acute pain of left knee     Plan: Impression is nearly resolved left knee pain.  Overall she is doing well I think she will continue to improve.  She does have some moderate osteoarthritis.  She is currently receiving home health physical therapy.  From my standpoint we can see her back as needed.  Follow-Up Instructions: Return if symptoms worsen or fail to improve.   Orders:  Orders Placed This Encounter  Procedures  . XR KNEE 3 VIEW LEFT   No orders of the defined types were placed in this encounter.     Procedures: No procedures performed   Clinical Data: No additional findings.   Subjective: Chief Complaint  Patient presents with  . Left Knee - Follow-up    [name] is a 68 year old female who comes in today for 6-week return appointment.  She states that she has been doing well overall.  She did twist her knee a little bit 2 weeks ago but she is still able to ambulate and weight-bear with a walker at home.  She is not taking any pain medicines.  She is receiving home health physical therapy for strengthening and gait training.   Review of Systems  Constitutional: Negative.   HENT: Negative.   Eyes: Negative.   Respiratory: Negative.   Cardiovascular: Negative.   Endocrine: Negative.   Musculoskeletal: Negative.   Neurological: Negative.   Hematological: Negative.   Psychiatric/Behavioral: Negative.   All other systems reviewed and are negative.    Objective: Vital Signs: There were no vitals taken for this visit.  Physical Exam Vitals signs and nursing note reviewed.  Constitutional:      Appearance: She is well-developed.  Pulmonary:     Effort: Pulmonary  effort is normal.  Skin:    General: Skin is warm.     Capillary Refill: Capillary refill takes less than 2 seconds.  Neurological:     Mental Status: She is alert and oriented to person, place, and time.  Psychiatric:        Behavior: Behavior normal.        Thought Content: Thought content normal.        Judgment: Judgment normal.     Ortho Exam Left knee exam shows no swelling or joint effusion.  Collaterals and cruciates are stable.  No real tenderness to palpation. Specialty Comments:  No specialty comments available.  Imaging: Xr Knee 3 View Left  Result Date: 03/06/2019 Moderate osteoarthritis and periarticular spurring.    PMFS History: Patient Active Problem List   Diagnosis Date Noted  . Anasarca 09/19/2018  . Acute encephalopathy 06/16/2018  . Stroke (cerebrum) ([place]) 06/15/2018  . Acute on chronic diastolic CHF (congestive heart failure) ([place]) 05/10/2018  . Morbid obesity ([place])   . Hypertension   . CKD (chronic kidney disease), stage III ([place])   . Sleep apnea   . Hyperlipidemia   . GERD (gastroesophageal reflux disease)   . Atrial fibrillation ([place])   . S/P TAVR (transcatheter aortic valve replacement)   . Severe aortic  stenosis   . Vertigo 05/06/2015  . TIA (transient ischemic attack), history of 11/07/2014  . 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 CKD stage 3 due to type 2 diabetes mellitus ([place]) 06/03/2014  . Type II diabetes mellitus with renal manifestations ([place]) 06/03/2014  . Type II diabetes mellitus with peripheral circulatory disorder ([place]) 06/03/2014  . Benign essential HTN 12/26/2008  . Coronary artery disease, non-occlusive 12/26/2008  . Pericardial effusion 12/26/2008   Past Medical History:  Diagnosis Date  . Atrial fibrillation ([place])    a. maintaining sinus s/p DCCV, on Eliquis  . CAD (coronary artery disease)    a. Nonobstructive by cath 2006 - 25% LAD, 25-30% after 1st diag, distal 25% PDA, minor irreg LCx. b. Normal nuc 2011.  .  CKD (chronic kidney disease), stage III ([place])   . Cystocele   . Difficult intubation 09-07-2012   big neck trouble with intubation parotid surgery"takes little med to sedate"  . Esophageal stricture   . GERD (gastroesophageal reflux disease)   . Hiatal hernia    a. 07/2013: HH with small stricture holding up barium tablet (concides with patient's sx of food sticking).  . History of kidney stones   . Hyperlipidemia   . Hypertension   . IBS (irritable bowel syndrome)   . Memory difficulty 11/26/2015  . Morbid obesity ([place])    a. hx of gastric bypass (sleeve gastrectomy 2015)  . Osteoporosis   . 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  Marland Kitchen Pericardial effusion    a. Mod by echo 2012 at time of PNA. b. Echo 07/2012: small pericardial effusion vs fat.  . S/P TAVR (transcatheter aortic valve replacement)   . Severe aortic stenosis   . Sleep apnea     "have mask; don't use it" (02/14/2018)  . TIA (transient ischemic attack) 10/2014; 06/2016   "some memory issues since; daughter says my speech is sometimes different" (02/14/2018)  . Type II diabetes mellitus ([facility])     Family History  Problem Relation Age of Onset  . Heart failure Mother   . Hypertension Mother   . Skin cancer Mother   . Colitis Mother   . Diabetes Mother   . Kidney Stones Mother   . Stroke Mother   . Heart disease Father   . Colon polyps Father   . Diabetes Father   . Heart failure Father   . Heart attack Sister   . Diabetes Other   . Stroke Maternal Grandfather   . Diabetes Maternal Grandfather   . Colon cancer Neg Hx     Past Surgical History:  Procedure Laterality Date  . BREATH TEK H PYLORI N/A 07/29/2013   Procedure: [place];  Surgeon: Shann Medal, MD;  Location: [facility];  Service: General;  Laterality: N/A;  . CARDIAC CATHETERIZATION  2006  . CARDIOVERSION N/A 02/16/2018   Procedure: CARDIOVERSION;  Surgeon: Pixie Casino, MD;  Location: [facility];  Service:  Cardiovascular;  Laterality: N/A;  . CARPAL TUNNEL RELEASE Left 2011  . CATARACT EXTRACTION W/ INTRAOCULAR LENS  IMPLANT, BILATERAL Bilateral 2003   left  . CHOLECYSTECTOMY OPEN  1982  . COLONOSCOPY N/A 07/14/2015   Procedure: COLONOSCOPY;  Surgeon: Ladene Artist, MD;  Location: [facility];  Service: Endoscopy;  Laterality: N/A;  . ESOPHAGOGASTRODUODENOSCOPY N/A 12/27/2013   Procedure: ESOPHAGOGASTRODUODENOSCOPY (EGD);  Surgeon: Shann Medal, MD;  Location: [facility];  Service: General;  Laterality: N/A;  . ESOPHAGOGASTRODUODENOSCOPY (EGD)  WITH ESOPHAGEAL DILATION    . INTRAOPERATIVE TRANSTHORACIC ECHOCARDIOGRAM  05/08/2018   Procedure: INTRAOPERATIVE TRANSTHORACIC ECHOCARDIOGRAM;  Surgeon: Burnell Blanks, MD;  Location: [facility];  Service: Open Heart Surgery;;  . LAPAROSCOPIC GASTRIC SLEEVE RESECTION N/A 01/20/2014   Procedure: LAPAROSCOPIC GASTRIC SLEEVE RESECTION;  Surgeon: Shann Medal, MD;  Location: [facility];  Service: General;  Laterality: N/A;  . PAROTIDECTOMY  09/07/2012   Procedure: PAROTIDECTOMY;  Surgeon: Melida Quitter, MD;  Location: [facility];  Service: ENT;  Laterality: Left;  LEFT PAROTIDECTOMY  . RIGHT HEART CATH N/A 09/25/2018   Procedure: RIGHT HEART CATH;  Surgeon: Larey Dresser, MD;  Location: [facility];  Service: Cardiovascular;  Laterality: N/A;  . RIGHT/LEFT HEART CATH AND CORONARY ANGIOGRAPHY N/A 03/29/2018   Procedure: RIGHT/LEFT HEART CATH AND CORONARY ANGIOGRAPHY;  Surgeon: Burnell Blanks, MD;  Location: [facility];  Service: Cardiovascular;  Laterality: N/A;  . TEE WITHOUT CARDIOVERSION N/A 02/16/2018   Procedure: TRANSESOPHAGEAL ECHOCARDIOGRAM (TEE);  Surgeon: Pixie Casino, MD;  Location[facility];  Service: Cardiovascular;  Laterality: N/A;  . TOE AMPUTATION Left 07/2017   great toe; [facility]   . TONSILLECTOMY  1968  . TRANSCATHETER AORTIC VALVE REPLACEMENT, TRANSFEMORAL  05/08/2018   Transcatheter Aortic Valve Replacement    . TRANSCATHETER AORTIC VALVE REPLACEMENT, TRANSFEMORAL N/A 05/08/2018   Procedure: TRANSCATHETER AORTIC VALVE REPLACEMENT, TRANSFEMORAL;  Surgeon: Burnell Blanks, MD;  Location: [facility];  Service: Open Heart Surgery;  Laterality: N/A;  . TUBAL LIGATION  yrs ago  . UPPER GI ENDOSCOPY  01/20/2014   Procedure: UPPER GI ENDOSCOPY;  Surgeon: Shann Medal, MD;  Location: [facility];  Service: General;;   Social History   Occupational History  . Occupation: retired    Fish farm manager: [employer]  Tobacco Use  . Smoking status: Never Smoker  . Smokeless tobacco: Never Used  Substance and Sexual Activity  . Alcohol use: Yes    Alcohol/week: 0.0 standard drinks    Comment:  "mixed drink q few years"  . Drug use: Never  . Sexual activity: Yes    Birth control/protection: Post-menopausal

## 2019-03-11 ENCOUNTER — Other Ambulatory Visit: Payer: Self-pay

## 2019-03-11 ENCOUNTER — Other Ambulatory Visit: Payer: Medicare Other

## 2019-03-11 ENCOUNTER — Other Ambulatory Visit (HOSPITAL_COMMUNITY): Payer: Self-pay

## 2019-03-11 DIAGNOSIS — E1122 Type 2 diabetes mellitus with diabetic chronic kidney disease: Secondary | ICD-10-CM | POA: Diagnosis not present

## 2019-03-11 DIAGNOSIS — E781 Pure hyperglyceridemia: Secondary | ICD-10-CM | POA: Diagnosis not present

## 2019-03-11 DIAGNOSIS — E1151 Type 2 diabetes mellitus with diabetic peripheral angiopathy without gangrene: Secondary | ICD-10-CM | POA: Diagnosis not present

## 2019-03-11 DIAGNOSIS — I5032 Chronic diastolic (congestive) heart failure: Secondary | ICD-10-CM

## 2019-03-11 DIAGNOSIS — S83412D Sprain of medial collateral ligament of left knee, subsequent encounter: Secondary | ICD-10-CM | POA: Diagnosis not present

## 2019-03-11 DIAGNOSIS — H40033 Anatomical narrow angle, bilateral: Secondary | ICD-10-CM | POA: Diagnosis not present

## 2019-03-11 DIAGNOSIS — G44219 Episodic tension-type headache, not intractable: Secondary | ICD-10-CM | POA: Diagnosis not present

## 2019-03-11 DIAGNOSIS — S7002XD Contusion of left hip, subsequent encounter: Secondary | ICD-10-CM | POA: Diagnosis not present

## 2019-03-11 DIAGNOSIS — N183 Chronic kidney disease, stage 3 (moderate): Secondary | ICD-10-CM | POA: Diagnosis not present

## 2019-03-11 DIAGNOSIS — W19XXXD Unspecified fall, subsequent encounter: Secondary | ICD-10-CM | POA: Diagnosis not present

## 2019-03-12 ENCOUNTER — Telehealth (HOSPITAL_COMMUNITY): Payer: Self-pay

## 2019-03-12 ENCOUNTER — Telehealth: Payer: Self-pay | Admitting: Pharmacist

## 2019-03-12 LAB — LIPID PANEL
Chol/HDL Ratio: 5.6 ratio — ABNORMAL HIGH (ref 0.0–4.4)
Cholesterol, Total: 248 mg/dL — ABNORMAL HIGH (ref 100–199)
HDL: 44 mg/dL (ref >39)
Triglycerides: 422 mg/dL — ABNORMAL HIGH (ref 0–149)

## 2019-03-12 LAB — BASIC METABOLIC PANEL
BUN/Creatinine Ratio: 24 (ref 12–28)
BUN: 49 mg/dL — ABNORMAL HIGH (ref 8–27)
CO2: 26 mmol/L (ref 20–29)
Calcium: 9.6 mg/dL (ref 8.7–10.3)
Chloride: 97 mmol/L (ref 96–106)
Creatinine, Ser: 2.06 mg/dL — ABNORMAL HIGH (ref 0.57–1.00)
GFR calc Af Amer: 28 mL/min/{1.73_m2} — ABNORMAL LOW (ref >59)
GFR calc non Af Amer: 24 mL/min/{1.73_m2} — ABNORMAL LOW (ref >59)
Glucose: 310 mg/dL — ABNORMAL HIGH (ref 65–99)
Potassium: 4.1 mmol/L (ref 3.5–5.2)
Sodium: 142 mmol/L (ref 134–144)

## 2019-03-12 NOTE — Telephone Encounter (Signed)
I have scheduled patient in the lipid clinic on 8/17. I have also added on a direct LDL to the lipid panel drawn yesterday

## 2019-03-12 NOTE — Telephone Encounter (Signed)
-----   Message from Larey Dresser, MD sent at 03/12/2019  8:05 AM EDT ----- Creatinine better.  Triglycerides are even higher.  Would suggest that she start Vascepa 2 g bid with repeat lipids 2 months.

## 2019-03-12 NOTE — Telephone Encounter (Signed)
Spoke with patient, made aware of lab results. PT was started on Vascepa in May however she stopped after two weeks after instructed by daughter as they thought it was contributing to her falls. Pt has appt on Thursday with MD and would like to discuss further at visit.  Dr Aundra Dubin aware of same. Pt also states she was not called by lipid clinic. Spoke with lipid clinic staff Melissa, she sees referral in system and states she will call patient to schedule appt.

## 2019-03-13 DIAGNOSIS — S7002XD Contusion of left hip, subsequent encounter: Secondary | ICD-10-CM | POA: Diagnosis not present

## 2019-03-13 DIAGNOSIS — N183 Chronic kidney disease, stage 3 (moderate): Secondary | ICD-10-CM | POA: Diagnosis not present

## 2019-03-13 DIAGNOSIS — S83412D Sprain of medial collateral ligament of left knee, subsequent encounter: Secondary | ICD-10-CM | POA: Diagnosis not present

## 2019-03-13 DIAGNOSIS — W19XXXD Unspecified fall, subsequent encounter: Secondary | ICD-10-CM | POA: Diagnosis not present

## 2019-03-13 DIAGNOSIS — E1122 Type 2 diabetes mellitus with diabetic chronic kidney disease: Secondary | ICD-10-CM | POA: Diagnosis not present

## 2019-03-13 DIAGNOSIS — E1151 Type 2 diabetes mellitus with diabetic peripheral angiopathy without gangrene: Secondary | ICD-10-CM | POA: Diagnosis not present

## 2019-03-13 LAB — LDL CHOLESTEROL, DIRECT: LDL Direct: 150 mg/dL — ABNORMAL HIGH (ref 0–99)

## 2019-03-13 LAB — SPECIMEN STATUS REPORT

## 2019-03-14 ENCOUNTER — Other Ambulatory Visit: Payer: Self-pay

## 2019-03-14 ENCOUNTER — Ambulatory Visit (HOSPITAL_COMMUNITY)
Admission: RE | Admit: 2019-03-14 | Discharge: 2019-03-14 | Disposition: A | Discharge status: Home / Self Care | Payer: Medicare Other | Source: Ambulatory Visit | Attending: Cardiology | Admitting: Cardiology

## 2019-03-14 ENCOUNTER — Encounter (HOSPITAL_COMMUNITY): Payer: Self-pay | Admitting: Cardiology

## 2019-03-14 VITALS — BP 150/80 | HR 75 | Wt 200.0 lb

## 2019-03-14 DIAGNOSIS — E785 Hyperlipidemia, unspecified: Secondary | ICD-10-CM | POA: Insufficient documentation

## 2019-03-14 DIAGNOSIS — I130 Hypertensive heart and chronic kidney disease with heart failure and stage 1 through stage 4 chronic kidney disease, or unspecified chronic kidney disease: Secondary | ICD-10-CM | POA: Diagnosis not present

## 2019-03-14 DIAGNOSIS — Z7901 Long term (current) use of anticoagulants: Secondary | ICD-10-CM | POA: Insufficient documentation

## 2019-03-14 DIAGNOSIS — K219 Gastro-esophageal reflux disease without esophagitis: Secondary | ICD-10-CM | POA: Diagnosis not present

## 2019-03-14 DIAGNOSIS — L97419 Non-pressure chronic ulcer of right heel and midfoot with unspecified severity: Secondary | ICD-10-CM | POA: Insufficient documentation

## 2019-03-14 DIAGNOSIS — N183 Chronic kidney disease, stage 3 unspecified: Secondary | ICD-10-CM

## 2019-03-14 DIAGNOSIS — I5032 Chronic diastolic (congestive) heart failure: Secondary | ICD-10-CM

## 2019-03-14 DIAGNOSIS — Z6831 Body mass index (BMI) 31.0-31.9, adult: Secondary | ICD-10-CM | POA: Insufficient documentation

## 2019-03-14 DIAGNOSIS — K449 Diaphragmatic hernia without obstruction or gangrene: Secondary | ICD-10-CM | POA: Insufficient documentation

## 2019-03-14 DIAGNOSIS — G4733 Obstructive sleep apnea (adult) (pediatric): Secondary | ICD-10-CM | POA: Diagnosis not present

## 2019-03-14 DIAGNOSIS — Z794 Long term (current) use of insulin: Secondary | ICD-10-CM | POA: Diagnosis not present

## 2019-03-14 DIAGNOSIS — I2510 Atherosclerotic heart disease of native coronary artery without angina pectoris: Secondary | ICD-10-CM | POA: Insufficient documentation

## 2019-03-14 DIAGNOSIS — Z9884 Bariatric surgery status: Secondary | ICD-10-CM | POA: Diagnosis not present

## 2019-03-14 DIAGNOSIS — E1122 Type 2 diabetes mellitus with diabetic chronic kidney disease: Secondary | ICD-10-CM

## 2019-03-14 DIAGNOSIS — Z79899 Other long term (current) drug therapy: Secondary | ICD-10-CM | POA: Insufficient documentation

## 2019-03-14 DIAGNOSIS — I480 Paroxysmal atrial fibrillation: Secondary | ICD-10-CM | POA: Diagnosis not present

## 2019-03-14 DIAGNOSIS — Z8249 Family history of ischemic heart disease and other diseases of the circulatory system: Secondary | ICD-10-CM | POA: Insufficient documentation

## 2019-03-14 DIAGNOSIS — M25562 Pain in left knee: Secondary | ICD-10-CM | POA: Diagnosis not present

## 2019-03-14 DIAGNOSIS — R0602 Shortness of breath: Secondary | ICD-10-CM | POA: Diagnosis not present

## 2019-03-14 DIAGNOSIS — E11621 Type 2 diabetes mellitus with foot ulcer: Secondary | ICD-10-CM | POA: Diagnosis not present

## 2019-03-14 MED ORDER — TORSEMIDE 20 MG PO TABS: 20.0000 mg | ORAL_TABLET | Freq: Two times a day (BID) | ORAL | 3 refills | Status: DC

## 2019-03-14 MED ORDER — HYDRALAZINE HCL 10 MG PO TABS: ORAL_TABLET | ORAL | 3 refills | Status: DC

## 2019-03-14 MED ORDER — FENOFIBRATE 48 MG PO TABS: 48.0000 mg | ORAL_TABLET | Freq: Every day | ORAL | 3 refills | Status: DC

## 2019-03-14 NOTE — Progress Notes (Signed)
2 boxes Eliquis 5 mg samples givne to pt during OV per DM  LOT [id number]  EXP APR 2022

## 2019-03-14 NOTE — Progress Notes (Signed)
Date:  03/14/2019   ID:  Alison, [name] August 16, 1950, MRN [id number]    Provider location: [facility] Type of Visit: Established patient   PCP:  [name], DO  Cardiologist:  Minus Breeding, MD Primary HF: Dr. Aundra Dubin  Chief Complaint: Shortness of breath   History of Present Illness: [name] is a 68 y.o. female who has a history of AS s/p TAVR, chronic diastolic CHF, PAF, CKD III, HLD, HTN, OSA, and morbid obesity.   She was admitted from [facility] office 09/19/18 with marked volume overload. She diuresed 36 lbs with lasix drip and was transitioned to torsemide 40 mg daily. Course complicated by AKI. RHC completed and showed elevated filling pressures and preserved cardiac output.  PYP scan suggestive of TTR amyloid. Discharge weight was 208 pounds.   She developed a non-healing right heal ulcer followed by a podiatrist in [place].  She ended up having a peripheral angiogram in [place] with PTCA to the right popliteal and right AT.  She still has a right heal ulcer but says that it seems to have improved some.  SBP fluctuates, running anywhere from 100s-160s.  She walks with a walker, no dyspnea walking in the house.  She has left knee pain which limits her ambulation.  No claudication.  No orthopnea/PND.  Weight is down 12 lbs since last appointment.    ECG (personally reviewed): NSR, lateral TWIs  Labs (11/19): LDL 115 Labs (2/20): K 4.4, creatinine 2.39, myeloma panel negative Labs (7/20): LDL 150, triglycerides 422, K 4.1, creatinine 2.97 => 2.06  PMH: 1. Chronic diastolic CHF: Echo (2/11) with EF 60-65%, moderate LVH, mildly dilated RV with mildly decreased systolic function, bioprosthetic aortic valve s/p TAVR appeared to function normally.  - RHC (2/20): mean RA 13, PA 50/21 mean 33, PCWP mean 25, CI 2.92, PVR 1.3 WU 2. Cardiac amyloidosis: TTR amyloidosis.   - PYP scan (2/20): grade 2, H/CL ratio 1.8 => suggestive of  TTR amyloidosis.  - myeloma panel negative.  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  3. Bioprosthetic aortic valve s/p TAVR.  4. CKD: Stage 3.  5. Atrial fibrillation: Paroxysmal.  6. Hyperlipidemia: unable to tolerate Crestor or Vascepa.  7. GERD with hiatal hernia 8. HTN 9. Obesity s/p gastric bypass in 2015.  10. Type 2 diabetes 11. OSA: Unable to tolerate CPAP.  12. CAD: LHC (8/19) with 50% proximal RCA, 50% proximal LCx, 65% mid LCx, 99% distal LAD (medical management).  13. Carotid dopplers (8/19) with mild disease.  14. PAD: Peripheral arterial angiography in 7/20 with PTCA right popliteal artery and right AT.    Current Outpatient Medications  Medication Sig Dispense Refill  . ACCU-CHEK AVIVA PLUS test strip USE TO CHECK BLOOD SUGAR UP TO TWICE DAILY OR AS INSTRUCTED 100 each 2  . B-D INS SYR ULTRAFINE 1CC/31G 31G X 5/16" 1 ML MISC USE TO INJECT INSULIN TWICE A DAY AS INSTRUCTED (Patient taking differently: 1 Stick by Other route 3 (three) times daily before meals. ) 100 each 2  . Calcium Carbonate-Vitamin D (CALCIUM 600+D) 600-400 MG-UNIT per tablet Take 1 tablet by mouth 2 (two) times daily. (Patient taking differently: Take 1 tablet by mouth 3 (three) times a week. )    . carvedilol (COREG) 25 MG tablet Take 1 tablet (25 mg total) by mouth 2 (two) times daily. 60 tablet 11  . Cholecalciferol (VITAMIN D3) 50 MCG (2000 UT) TABS Take 2,000 Units  by mouth daily.    . clindamycin (CLEOCIN) 300 MG capsule Take 2 tablets (600 mg) one hour prior to dental visits. 6 capsule 3  . ELIQUIS 5 MG TABS tablet TAKE 1 TABLET BY MOUTH TWICE A DAY 180 tablet 0  . hydrALAZINE (APRESOLINE) 10 MG tablet Take 1 tablet (10 mg total) by mouth every morning AND 2 tablets (20 mg total) daily with lunch AND 2 tablets (20 mg total) at bedtime. 450 tablet 3  . insulin aspart protamine- aspart (NOVOLOG MIX 70/30) (70-30) 100 UNIT/ML injection Inject into the skin. Inject 75u in am 60u at lunch  and at bedtime    . Insulin Pen Needle 32G X 4 MM MISC Use to inject insulin with insulin pen 100 each 4  . LIVALO 4 MG TABS TAKE 1 TABLET BY MOUTH ONCE DAILY 90 tablet 1  . meclizine (ANTIVERT) 12.5 MG tablet Take 12.5 mg by mouth 3 (three) times daily as needed for dizziness.    . mupirocin ointment (BACTROBAN) 2 % Apply 1 application topically 2 (two) times daily. 22 g 0  . ondansetron (ZOFRAN ODT) 4 MG disintegrating tablet Take 1 tablet (4 mg total) by mouth every 8 (eight) hours as needed for nausea. 10 tablet 0  . Prenatal Vit-Fe Fumarate-FA (PRENATAL MULTIVITAMIN) TABS tablet Take 1 tablet by mouth 3 (three) times a week.    Marland Kitchen Propylene Glycol (SYSTANE COMPLETE) 0.6 % SOLN Apply 1 drop to eye daily as needed (dry eyes).     . sodium chloride (OCEAN) 0.65 % SOLN nasal spray Place 1 spray into both nostrils as needed for congestion. 15 mL 0  . torsemide (DEMADEX) 20 MG tablet Take 1 tablet (20 mg total) by mouth 2 (two) times daily. 180 tablet 3  . vitamin E 400 UNIT capsule Take 400 Units by mouth 3 (three) times a week.     . fenofibrate (TRICOR) 48 MG tablet Take 1 tablet (48 mg total) by mouth daily. 90 tablet 3   No current facility-administered medications for this encounter.     Allergies:   Amlodipine, Atorvastatin, Crestor [rosuvastatin calcium], Ezetimibe-simvastatin, Statins, Celebrex [celecoxib], Levemir [insulin detemir], and Penicillins   Social History:  The patient  reports that she has never smoked. She has never used smokeless tobacco. She reports current alcohol use. She reports that she does not use drugs.   Family History:  The patient's family history includes Colitis in her mother; Colon polyps in her father; Diabetes in her father, maternal grandfather, mother, and another family member; Heart attack in her sister; Heart disease in her father; Heart failure in her father and mother; Hypertension in her mother; Kidney Stones in her mother; Skin cancer in her mother;  Stroke in her maternal grandfather and mother.   ROS:  Please see the history of present illness.   All other systems are personally reviewed and negative.   Exam:   BP (!) 150/80   Pulse 75   Wt 90.7 kg (200 lb)   SpO2 98%   BMI 31.32 kg/m  General: NAD Neck: Thick, JVP difficult, no thyromegaly or thyroid nodule.  Lungs: Clear to auscultation bilaterally with normal respiratory effort. CV: Nondisplaced PMI.  Heart regular S1/S2, no S3/S4, no murmur.  No peripheral edema.  No carotid bruit.  Normal pedal pulses.  Abdomen: Soft, nontender, no hepatosplenomegaly, no distention.  Skin: Intact without lesions or rashes.  Neurologic: Alert and oriented x 3.  Psych: Normal affect. Extremities: No clubbing or cyanosis. Right  lower leg wrapped.  HEENT: Normal.    Recent Labs: 09/05/2018: BNP 116.6 09/19/2018: ALT 24 09/26/2018: Magnesium 2.1 01/08/2019: Hemoglobin 14.7; Platelets 189 03/11/2019: BUN 49; Creatinine, Ser 2.06; Potassium 4.1; Sodium 142  Personally reviewed   Wt Readings from Last 3 Encounters:  03/14/19 90.7 kg (200 lb)  01/15/19 90.7 kg (200 lb)  01/08/19 92.5 kg (204 lb)      ASSESSMENT AND PLAN:  1. Chronic diastolic CHF: Echo 03/8890 EF 60-65%, moderate LVH, grade 2 diastolic dysfunction, trivial mitral stenosis, mildly dilated RV with mildly decreased systolic function, TAVR valve looks ok, moderate pericardial effusion (no tamponade).PYP scan in 2/20 suggestive of transthyretin amyloidosis, genetic testing suggestive of wild type.  NYHA class II-III symptoms. Weight lower, I do not think that she is significantly volume overloaded.   - Continue torsemide 20 mg bid.    2. CKD stage 3: Recent creatinine improved at 2.06  3. CAD: Cath in 8/19 with moderate RCA and LCx disease, severe distal LAD disease, medical management.  No chest pain. - No ASA given apixaban use.  - Continue pitivastatin for now.  However, both TGs and LDL are high.  She cannot tolerate  Crestor or Vascepa.  I will refer to lipid clinic for Repatha and will start her on fenofibrate 54 mg daily.   4. Atrial fibrillation: Paroxysmal. She is in NSR today.  - Continue Eliquis.  5. HTN: BP fluctuates significantly.    - Continue Coreg 25 mg BID. - She cannot take amlodipine due to peripheral edema formation.  - Hold off on ACEI/ARB with CKD.  - Increase hydralazine to 10 mg/20 mg/20 mg.    6. PAD: She has a right heel ulcer followed by podiatry and is now s/p PTCA to right popliteal and right AT in 7/20.  7. 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  - She still has not gotten tafamidis, will look into this.  8. S/p TAVR: Bioprosthetic aortic valve stable on last echo.   Signed, [name], MD  03/14/2019   [place] [address] [facility] [place] [address] [phone] (office) [phone] (fax)

## 2019-03-14 NOTE — Patient Instructions (Signed)
EKG was completed.   BEGIN taking Fenofibrate 48 mg (1 tab) daily.   INCREASE Hydralazine to 10 mg (1 tab) in the morning, 20 mg (2 tabs) at lunch, and 20 mg (2 tabs) in the evening.   Your physician recommends that you schedule a follow-up appointment in: 3 months.   At the [facility], you and your health needs are our priority. As part of our continuing mission to provide you with exceptional heart care, we have created designated Provider Care Teams. These Care Teams include your primary Cardiologist (physician) and Advanced Practice Providers (APPs- Physician Assistants and Nurse Practitioners) who all work together to provide you with the care you need, when you need it.   You may see any of the following providers on your designated Care Team at your next follow up: Marland Kitchen Dr Glori Bickers . Dr [name] . Darrick Grinder, NP

## 2019-03-18 ENCOUNTER — Other Ambulatory Visit: Payer: Self-pay | Admitting: Physician Assistant

## 2019-03-18 DIAGNOSIS — Z952 Presence of prosthetic heart valve: Secondary | ICD-10-CM

## 2019-03-20 DIAGNOSIS — Z48812 Encounter for surgical aftercare following surgery on the circulatory system: Secondary | ICD-10-CM | POA: Diagnosis not present

## 2019-03-20 DIAGNOSIS — I739 Peripheral vascular disease, unspecified: Secondary | ICD-10-CM | POA: Diagnosis not present

## 2019-03-22 ENCOUNTER — Other Ambulatory Visit: Payer: Self-pay | Admitting: Family Medicine

## 2019-03-22 ENCOUNTER — Encounter: Payer: Self-pay | Admitting: Nurse Practitioner

## 2019-03-22 ENCOUNTER — Other Ambulatory Visit: Payer: Self-pay

## 2019-03-22 ENCOUNTER — Ambulatory Visit (INDEPENDENT_AMBULATORY_CARE_PROVIDER_SITE_OTHER): Payer: Medicare Other | Admitting: Nurse Practitioner

## 2019-03-22 DIAGNOSIS — S91301D Unspecified open wound, right foot, subsequent encounter: Secondary | ICD-10-CM

## 2019-03-22 DIAGNOSIS — I2510 Atherosclerotic heart disease of native coronary artery without angina pectoris: Secondary | ICD-10-CM | POA: Diagnosis not present

## 2019-03-22 MED ORDER — CIPROFLOXACIN HCL 500 MG PO TABS: 500.0000 mg | ORAL_TABLET | Freq: Two times a day (BID) | ORAL | 0 refills | Status: DC

## 2019-03-22 NOTE — Progress Notes (Signed)
   Virtual Visit via telephone Note Due to COVID-19 pandemic this visit was conducted virtually. This visit type was conducted due to national recommendations for restrictions regarding the COVID-19 Pandemic (e.g. social distancing, sheltering in place) in an effort to limit this patient's exposure and mitigate transmission in our community. All issues noted in this document were discussed and addressed.  A physical exam was not performed with this format.  I connected with [name] on 03/22/19 at 11:15 by telephone and verified that I am speaking with the correct person using two identifiers. [name] is currently located at home and Audry Pili is currently with her during visit. The provider, Mary-Margaret Hassell Done, FNP is located in their office at time of visit.  I discussed the limitations, risks, security and privacy concerns of performing an evaluation and management service by telephone and the availability of in person appointments. I also discussed with the patient that there may be a patient responsible charge related to this service. The patient expressed understanding and agreed to proceed.   History and Present Illness:   Chief Complaint: Wound Infection   HPI Patient calls in today c/o of sore lesion on bottom of right foot. She recently had balloon angiogram on July 8th of right leg. She has had wound on right foot since march but was getting better. Since surgery it has gradually starting looking worse.  Denies any drainage.   Review of Systems  Constitutional: Negative for diaphoresis and weight loss.  Eyes: Negative for blurred vision, double vision and pain.  Respiratory: Negative for shortness of breath.   Cardiovascular: Negative for chest pain, palpitations, orthopnea and leg swelling.  Gastrointestinal: Negative for abdominal pain.  Skin: Negative for rash.  Neurological: Negative for dizziness, sensory change, loss of consciousness, weakness and headaches.   Endo/Heme/Allergies: Negative for polydipsia. Does not bruise/bleed easily.  Psychiatric/Behavioral: Negative for memory loss. The patient does not have insomnia.   All other systems reviewed and are negative.    Observations/Objective: alert and oriented- answers all questions appropriately No distress. Describes sore as annular the size of a silver dollar. Mild erythema with yellowish excudate.  Assessment and Plan: [name] in today with chief complaint of Wound Infection   1. Non-healing open wound of right heel, subsequent encounter Continue to dress as instructed Avoid bearing weight on it as much as possible - ciprofloxacin (CIPRO) 500 MG tablet; Take 1 tablet (500 mg total) by mouth 2 (two) times daily.  Dispense: 20 tablet; Refill: 0   Follow Up Instructions: prn    I discussed the assessment and treatment plan with the patient. The patient was provided an opportunity to ask questions and all were answered. The patient agreed with the plan and demonstrated an understanding of the instructions.   The patient was advised to call back or seek an in-person evaluation if the symptoms worsen or if the condition fails to improve as anticipated.  The above assessment and management plan was discussed with the patient. The patient verbalized understanding of and has agreed to the management plan. Patient is aware to call the clinic if symptoms persist or worsen. Patient is aware when to return to the clinic for a follow-up visit. Patient educated on when it is appropriate to go to the emergency department.   Time call ended:  11:26  I provided 16 minutes of non-face-to-face time during this encounter.    Mary-Margaret Hassell Done, FNP

## 2019-03-25 ENCOUNTER — Other Ambulatory Visit: Payer: Self-pay

## 2019-03-25 ENCOUNTER — Telehealth (HOSPITAL_COMMUNITY): Payer: Self-pay

## 2019-03-25 NOTE — Patient Outreach (Signed)
[facility]) Care Management  03/25/2019   [name] [birth year] [id number]  Subjective: Successful call to the patient.  She verified two patient identifiers. She states she is doing well.  She denies any shortness of breath,  States she had some chest pain last night but feels it was indigestion and she is better today.  She has some swelling in her right foot due to her wound.  Her weight today was 200 lbs and her blood sugar was 216.  She called her endocrinologist and notified them of the elevation.  Her Novolog 70/30 was increase to 75 units in the morning and 60 units at noon and bedtime. Metformin was discontinued due to affecting her kidneys.  She states that she is ambulating in the home using her walker or with her husband helping.  She has an appointment with the Lipid clinic on the 17th of this month.    Current Medications:  Current Outpatient Medications  Medication Sig Dispense Refill  . ACCU-CHEK AVIVA PLUS test strip USE TO CHECK BLOOD SUGAR UP TO TWICE DAILY OR AS INSTRUCTED 100 each 2  . B-D INS SYR ULTRAFINE 1CC/31G 31G X 5/16" 1 ML MISC USE TO INJECT INSULIN TWICE A DAY AS INSTRUCTED (Patient taking differently: 1 Stick by Other route 3 (three) times daily before meals. ) 100 each 2  . Calcium Carbonate-Vitamin D (CALCIUM 600+D) 600-400 MG-UNIT per tablet Take 1 tablet by mouth 2 (two) times daily. (Patient taking differently: Take 1 tablet by mouth 3 (three) times a week. )    . carvedilol (COREG) 25 MG tablet Take 1 tablet (25 mg total) by mouth 2 (two) times daily. 60 tablet 11  . Cholecalciferol (VITAMIN D3) 50 MCG (2000 UT) TABS Take 2,000 Units by mouth daily.    . ciprofloxacin (CIPRO) 500 MG tablet Take 1 tablet (500 mg total) by mouth 2 (two) times daily. 20 tablet 0  . clindamycin (CLEOCIN) 300 MG capsule Take 2 tablets (600 mg) one hour prior to dental visits. 6 capsule 3  . ELIQUIS 5 MG TABS tablet TAKE 1 TABLET BY MOUTH TWICE A DAY 180  tablet 0  . fenofibrate (TRICOR) 48 MG tablet Take 1 tablet (48 mg total) by mouth daily. 90 tablet 3  . hydrALAZINE (APRESOLINE) 10 MG tablet Take 1 tablet (10 mg total) by mouth every morning AND 2 tablets (20 mg total) daily with lunch AND 2 tablets (20 mg total) at bedtime. 450 tablet 3  . insulin aspart protamine- aspart (NOVOLOG MIX 70/30) (70-30) 100 UNIT/ML injection Inject into the skin. Inject 75u in am 60u at lunch and at bedtime    . Insulin Pen Needle 32G X 4 MM MISC Use to inject insulin with insulin pen 100 each 4  . LIVALO 4 MG TABS TAKE 1 TABLET BY MOUTH EVERY DAY 90 tablet 1  . meclizine (ANTIVERT) 12.5 MG tablet Take 12.5 mg by mouth 3 (three) times daily as needed for dizziness.    . mupirocin ointment (BACTROBAN) 2 % Apply 1 application topically 2 (two) times daily. 22 g 0  . ondansetron (ZOFRAN ODT) 4 MG disintegrating tablet Take 1 tablet (4 mg total) by mouth every 8 (eight) hours as needed for nausea. 10 tablet 0  . Prenatal Vit-Fe Fumarate-FA (PRENATAL MULTIVITAMIN) TABS tablet Take 1 tablet by mouth 3 (three) times a week.    Marland Kitchen Propylene Glycol (SYSTANE COMPLETE) 0.6 % SOLN Apply 1 drop to eye daily as needed (dry eyes).     Marland Kitchen  sodium chloride (OCEAN) 0.65 % SOLN nasal spray Place 1 spray into both nostrils as needed for congestion. 15 mL 0  . torsemide (DEMADEX) 20 MG tablet Take 1 tablet (20 mg total) by mouth 2 (two) times daily. 180 tablet 3  . vitamin E 400 UNIT capsule Take 400 Units by mouth 3 (three) times a week.      No current facility-administered medications for this visit.     Functional Status:  In your present state of health, do you have any difficulty performing the following activities: 02/21/2019 01/31/2019  Hearing? N Y  Comment decreased hearing decline hearing aid decreased hearing, not interested in hearing evaluation at this time  Vision? N N  Difficulty concentrating or making decisions? N N  Walking or climbing stairs? Y Y  Comment due to  wound on right foot uses wheelchair currently due to broken leg  Dressing or bathing? Tempie Donning  Comment family Family helps  Doing errands, shopping? Y Y  Comment family provides transpotation Family provides transportation currently due to broken leg  Preparing Food and eating ? - N  Using the Toilet? - N  In the past six months, have you accidently leaked urine? - Y  Comment - occassional urine leaking  Do you have problems with loss of bowel control? - N  Managing your Medications? - N  Managing your Finances? - N  Housekeeping or managing your Housekeeping? - Y  Comment - Husband helps  Some recent data might be hidden    Fall/Depression Screening: Fall Risk  03/25/2019 02/21/2019 01/31/2019  Falls in the past year? 0 1 1  Number falls in past yr: - 1 0  Injury with Fall? - 1 1  Comment - pulled something in bag and leg -  Risk for fall due to : - Impaired balance/gait -  Follow up - Education provided Education provided;Falls prevention discussed   PHQ 2/9 Scores 02/21/2019 01/31/2019 01/15/2019 11/14/2018 11/12/2018 11/05/2018 10/29/2018  PHQ - 2 Score 0 0 0 0 0 0 0  PHQ- 9 Score - - 0 - - - -    Assessment: Patient will continue to benefit from health coach outreach for disease management and support. [facility] CM Care Plan Problem One     Most Recent Value  Care Plan Problem One  Knowledge deficit of dieasse management  [facility] Long Term Goal   In 60 days the patient will verbalize no admissions to the hospital for CHF and lowering her a1c 1-2 points  [facility] Long Term Goal Start Date  03/25/19     Plan: RN Health Coach will contact patient in the month of October and patient agrees to next.  Lazaro Arms RN, BSN, [place] Direct Dial:  [phone]  Fax: [phone]

## 2019-03-25 NOTE — Telephone Encounter (Signed)
-----   Message from Larey Dresser, MD sent at 03/14/2019 11:41 PM EDT ----- This patient was supposed to start on tafamidis/Vyndaquel for amyloidosis.  Can one of you check to see what is going on this this? Thanks.

## 2019-03-25 NOTE — Telephone Encounter (Signed)
Office portion of vyndamax forms completed. Spoke with patient, she will come to office on 8/17 to sign her portion and provide financial documents to be sent in with forms.

## 2019-03-28 DIAGNOSIS — I739 Peripheral vascular disease, unspecified: Secondary | ICD-10-CM | POA: Diagnosis not present

## 2019-03-28 DIAGNOSIS — Z48812 Encounter for surgical aftercare following surgery on the circulatory system: Secondary | ICD-10-CM | POA: Diagnosis not present

## 2019-03-28 DIAGNOSIS — L97412 Non-pressure chronic ulcer of right heel and midfoot with fat layer exposed: Secondary | ICD-10-CM | POA: Diagnosis not present

## 2019-04-01 ENCOUNTER — Ambulatory Visit (INDEPENDENT_AMBULATORY_CARE_PROVIDER_SITE_OTHER): Payer: Medicare Other

## 2019-04-01 ENCOUNTER — Other Ambulatory Visit: Payer: Self-pay

## 2019-04-01 VITALS — BP 112/68 | HR 69

## 2019-04-01 DIAGNOSIS — I2510 Atherosclerotic heart disease of native coronary artery without angina pectoris: Secondary | ICD-10-CM | POA: Diagnosis not present

## 2019-04-01 DIAGNOSIS — E785 Hyperlipidemia, unspecified: Secondary | ICD-10-CM | POA: Diagnosis not present

## 2019-04-01 NOTE — Patient Instructions (Addendum)
Thank you for seeing [place] today!  Please continue taking your Livalo and fenofibrate.   Please start taking Praluent 75 mg injection once every two weeks. We will send this prescription to your pharmacy once your insurance has approved this medication.  Please call our office at  [phone] if you have any questions.

## 2019-04-01 NOTE — Progress Notes (Signed)
Patient ID: [name]                 DOB: 09-02-1950                    MRN: [id number]     HPI: [name] is a 68 y.o. female patient referred to lipid clinic by Dr. Aundra Dubin. PMH is significant for AS s/p TAVR, chronic diastolic CHF, PAF, CKD III, HLD, HTN, OSA, and morbid obesity. Patient was started on Vascepa in May, but stopped taking it two weeks afterward because patient and daughter thought it was contributing to falls. At her last visit with Dr. Aundra Dubin on 03/14/19, fenofibrate 48 mg daily was started.   Patient presents to lipid clinic today for initial visit. Patient reports compliance to pitavastatin and fenofibrate. She reports previously taking Vascepa but believed this caused her fall that lead to a bone fracture in her foot/ankle. She is not interested in re-trialing this at this time. She also reports her diabetes is not controlled with variable glucose readings from 72-470, but typically running in the 200s. This morning her fasting BG was 170.   Current Medications: fenofibrate 48 mg daily, pitavastatin 4 mg daily Intolerances: crestor - myalgias, Zetia-Simvastatin - myalgias, atorvastatin - myalgias, Vascepa - stopped by pt because thought it was contributing to falls Risk Factors: CAD s/p TAVR LDL goal: <70, non-HDL <100  Diet: diet pepsi, minimal water, eats out frequently, cheeseburgers, salads, chicken, apples, oranges, broccoli, chicken salad, sugar free jello, yogurt  Exercise: walker at home, wheelchair while out  Family History:  Mother - diabetes, heart failure, HTN, stroke Father - diabetes, heart disease, heart failure Sister - heart attack  Social History: The patient  reports that she has never smoked. She has never used smokeless tobacco. She reports current alcohol use. She reports that she does not use drugs.   Labs: 03/11/19: TC 248, TG 422, HDL 44, LDL non-calculable, LDL direct 150 (pitavastatin 4 mg daily; stopped Vascepa ~ 2 months prior)  12/19/18: TC 269, TG 351, HDL 54, LDL 145 (pitavastatin 4 mg daily)   Past Medical History:  Diagnosis Date  . Atrial fibrillation ([place])    a. maintaining sinus s/p DCCV, on Eliquis  . CAD (coronary artery disease)    a. Nonobstructive by cath 2006 - 25% LAD, 25-30% after 1st diag, distal 25% PDA, minor irreg LCx. b. Normal nuc 2011.  . CKD (chronic kidney disease), stage III ([place])   . Cystocele   . Difficult intubation 09-07-2012   big neck trouble with intubation parotid surgery"takes little med to sedate"  . Esophageal stricture   . GERD (gastroesophageal reflux disease)   . Hiatal hernia    a. 07/2013: HH with small stricture holding up barium tablet (concides with patient's sx of food sticking).  . History of kidney stones   . Hyperlipidemia   . Hypertension   . IBS (irritable bowel syndrome)   . Memory difficulty 11/26/2015  . Morbid obesity ([place])    a. hx of gastric bypass (sleeve gastrectomy 2015)  . Osteoporosis   . 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  Marland Kitchen Pericardial effusion    a. Mod by echo 2012 at time of PNA. b. Echo 07/2012: small pericardial effusion vs fat.  . S/P TAVR (transcatheter aortic valve replacement)   . Severe aortic stenosis   . Sleep apnea     "have mask; don't use it" (02/14/2018)  .  TIA (transient ischemic attack) 10/2014; 06/2016   "some memory issues since; daughter says my speech is sometimes different" (02/14/2018)  . Type II diabetes mellitus ([place])     Current Outpatient Medications on File Prior to Visit  Medication Sig Dispense Refill  . ACCU-CHEK AVIVA PLUS test strip USE TO CHECK BLOOD SUGAR UP TO TWICE DAILY OR AS INSTRUCTED 100 each 2  . B-D INS SYR ULTRAFINE 1CC/31G 31G X 5/16" 1 ML MISC USE TO INJECT INSULIN TWICE A DAY AS INSTRUCTED (Patient taking differently: 1 Stick by Other route 3 (three) times daily before meals. ) 100 each 2  . Calcium Carbonate-Vitamin D (CALCIUM 600+D) 600-400 MG-UNIT per tablet Take 1 tablet  by mouth 2 (two) times daily. (Patient taking differently: Take 1 tablet by mouth 3 (three) times a week. )    . carvedilol (COREG) 25 MG tablet Take 1 tablet (25 mg total) by mouth 2 (two) times daily. 60 tablet 11  . Cholecalciferol (VITAMIN D3) 50 MCG (2000 UT) TABS Take 2,000 Units by mouth daily.    . ciprofloxacin (CIPRO) 500 MG tablet Take 1 tablet (500 mg total) by mouth 2 (two) times daily. 20 tablet 0  . clindamycin (CLEOCIN) 300 MG capsule Take 2 tablets (600 mg) one hour prior to dental visits. 6 capsule 3  . ELIQUIS 5 MG TABS tablet TAKE 1 TABLET BY MOUTH TWICE A DAY 180 tablet 0  . fenofibrate (TRICOR) 48 MG tablet Take 1 tablet (48 mg total) by mouth daily. 90 tablet 3  . hydrALAZINE (APRESOLINE) 10 MG tablet Take 1 tablet (10 mg total) by mouth every morning AND 2 tablets (20 mg total) daily with lunch AND 2 tablets (20 mg total) at bedtime. 450 tablet 3  . insulin aspart protamine- aspart (NOVOLOG MIX 70/30) (70-30) 100 UNIT/ML injection Inject into the skin. Inject 75u in am 60u at lunch and at bedtime    . Insulin Pen Needle 32G X 4 MM MISC Use to inject insulin with insulin pen 100 each 4  . LIVALO 4 MG TABS TAKE 1 TABLET BY MOUTH EVERY DAY 90 tablet 1  . meclizine (ANTIVERT) 12.5 MG tablet Take 12.5 mg by mouth 3 (three) times daily as needed for dizziness.    . mupirocin ointment (BACTROBAN) 2 % Apply 1 application topically 2 (two) times daily. 22 g 0  . ondansetron (ZOFRAN ODT) 4 MG disintegrating tablet Take 1 tablet (4 mg total) by mouth every 8 (eight) hours as needed for nausea. 10 tablet 0  . Prenatal Vit-Fe Fumarate-FA (PRENATAL MULTIVITAMIN) TABS tablet Take 1 tablet by mouth 3 (three) times a week.    Marland Kitchen Propylene Glycol (SYSTANE COMPLETE) 0.6 % SOLN Apply 1 drop to eye daily as needed (dry eyes).     . sodium chloride (OCEAN) 0.65 % SOLN nasal spray Place 1 spray into both nostrils as needed for congestion. 15 mL 0  . torsemide (DEMADEX) 20 MG tablet Take 1 tablet (20  mg total) by mouth 2 (two) times daily. 180 tablet 3  . vitamin E 400 UNIT capsule Take 400 Units by mouth 3 (three) times a week.     . [DISCONTINUED] Olmesartan-Amlodipine-HCTZ (TRIBENZOR) 40-5-12.5 MG TABS Take 1 tablet by mouth daily.       No current facility-administered medications on file prior to visit.     Allergies  Allergen Reactions  . Amlodipine Swelling    Ankle swelling  . Atorvastatin Other (See Comments)    Myalgias   .  Crestor [Rosuvastatin Calcium] Other (See Comments)    Stiffness and back pain  . Ezetimibe-Simvastatin Other (See Comments)    Leg cramps  . Statins Other (See Comments)    Myalgias  . Celebrex [Celecoxib] Rash  . Levemir [Insulin Detemir] Rash  . Penicillins Other (See Comments)    SYNCOPE (Tolerated Ancef) Has patient had a PCN reaction causing immediate rash, facial/tongue/throat swelling, SOB or lightheadedness with hypotension: Yes Has patient had a PCN reaction causing severe rash involving mucus membranes or skin necrosis: No Has patient had a PCN reaction that required hospitalization No Has patient had a PCN reaction occurring within the last 10 years: No If all of the above answers are "NO", then may proceed with Cephalosporin use.    Assessment/Plan:  1. Hyperlipidemia - Both LDL (goal <70) and TG (goal <150) are above goal. Start taking Praluent 75 mg [place] once every two weeks. Will submit prior auth to insurance prior to sending prescription to pharmacy. Continue taking pitavastatin and fenofibrate as previously prescribed. Educated on the importance of a healthy diet and minimizing carbs/sugars to better control blood sugar spikes. Exercise is a limiting factor with her being wheel-chair bound while out of the home, but still tries to remain active with daily chores. Repeat lipid panel and follow up in lipid clinic 3 months after starting Praluent.  Vertis Kelch, PharmD PGY2 Cardiology Pharmacy Resident [facility] [address] [place], [place] [address] Phone: [phone]; Fax: [phone]

## 2019-04-01 NOTE — Telephone Encounter (Signed)
Pt in office,  signed enrollment forms for [employer]. Enrollment forms and supporting documents faxed to [facility]

## 2019-04-02 ENCOUNTER — Other Ambulatory Visit: Payer: Self-pay | Admitting: Physician Assistant

## 2019-04-03 ENCOUNTER — Telehealth: Payer: Self-pay

## 2019-04-03 ENCOUNTER — Telehealth: Payer: Self-pay | Admitting: Pharmacist

## 2019-04-03 DIAGNOSIS — E785 Hyperlipidemia, unspecified: Secondary | ICD-10-CM

## 2019-04-03 MED ORDER — PRALUENT 75 MG/ML ~~LOC~~ SOAJ: 75.0000 mg | SUBCUTANEOUS | 6 refills | Status: DC

## 2019-04-03 NOTE — Telephone Encounter (Signed)
Cost of Praluent was ~$270. Patient states she pays $360 for Eliquis and a large amount of Livalo and this would not be feisable for patient.  Advised she call insurance company about the cost of her medications. States she is not in the coverage gap as she pays that price all year long. We will apply for assistance through [facility] for Praluent and possible Livalo. Will submit on Monday when Jinny Blossom returns to the office. Patient agreeable with plan.

## 2019-04-03 NOTE — Telephone Encounter (Signed)
Called and spoke with the pt regarding the praluent 75 being approved rx sent and instructed the pt to call back if it is unaffordable

## 2019-04-08 NOTE — Addendum Note (Signed)
Addended by: Kachina Niederer E on: 04/08/2019 04:10 PM   Modules accepted: Orders

## 2019-04-08 NOTE — Telephone Encounter (Addendum)
[employer] grant approved for $2500 through 03/07/20. Copay card info given to pharmacy to apply to [place]. [facility] pharmacy stated pt will need to remind pharmacy every month to apply the copay card.  Pt is aware. Lab orders have been entered, pt prefers labs to be drawn at [facility] in [place]. She will go the week before her f/u appt with Dr Aundra Dubin.

## 2019-04-15 ENCOUNTER — Encounter (HOSPITAL_COMMUNITY): Payer: Self-pay | Admitting: Emergency Medicine

## 2019-04-15 ENCOUNTER — Inpatient Hospital Stay (HOSPITAL_COMMUNITY)
Admission: EM | Admit: 2019-04-15 | Discharge: 2019-04-26 | DRG: 871 | Disposition: A | Discharge status: Home Health | Payer: Medicare Other | Attending: Internal Medicine | Admitting: Internal Medicine

## 2019-04-15 ENCOUNTER — Emergency Department (HOSPITAL_COMMUNITY): Payer: Medicare Other

## 2019-04-15 ENCOUNTER — Other Ambulatory Visit: Payer: Self-pay

## 2019-04-15 DIAGNOSIS — G9341 Metabolic encephalopathy: Secondary | ICD-10-CM | POA: Diagnosis present

## 2019-04-15 DIAGNOSIS — I330 Acute and subacute infective endocarditis: Secondary | ICD-10-CM | POA: Diagnosis present

## 2019-04-15 DIAGNOSIS — M47819 Spondylosis without myelopathy or radiculopathy, site unspecified: Secondary | ICD-10-CM | POA: Diagnosis not present

## 2019-04-15 DIAGNOSIS — Z880 Allergy status to penicillin: Secondary | ICD-10-CM

## 2019-04-15 DIAGNOSIS — A4181 Sepsis due to Enterococcus: Principal | ICD-10-CM | POA: Diagnosis present

## 2019-04-15 DIAGNOSIS — M7731 Calcaneal spur, right foot: Secondary | ICD-10-CM | POA: Diagnosis not present

## 2019-04-15 DIAGNOSIS — N179 Acute kidney failure, unspecified: Secondary | ICD-10-CM | POA: Diagnosis present

## 2019-04-15 DIAGNOSIS — E1121 Type 2 diabetes mellitus with diabetic nephropathy: Secondary | ICD-10-CM | POA: Diagnosis present

## 2019-04-15 DIAGNOSIS — W010XXA Fall on same level from slipping, tripping and stumbling without subsequent striking against object, initial encounter: Secondary | ICD-10-CM | POA: Diagnosis present

## 2019-04-15 DIAGNOSIS — Z20828 Contact with and (suspected) exposure to other viral communicable diseases: Secondary | ICD-10-CM | POA: Diagnosis not present

## 2019-04-15 DIAGNOSIS — Z87442 Personal history of urinary calculi: Secondary | ICD-10-CM

## 2019-04-15 DIAGNOSIS — I5032 Chronic diastolic (congestive) heart failure: Secondary | ICD-10-CM | POA: Diagnosis present

## 2019-04-15 DIAGNOSIS — Z713 Dietary counseling and surveillance: Secondary | ICD-10-CM

## 2019-04-15 DIAGNOSIS — M7989 Other specified soft tissue disorders: Secondary | ICD-10-CM | POA: Diagnosis not present

## 2019-04-15 DIAGNOSIS — E876 Hypokalemia: Secondary | ICD-10-CM | POA: Diagnosis present

## 2019-04-15 DIAGNOSIS — Z9884 Bariatric surgery status: Secondary | ICD-10-CM | POA: Diagnosis not present

## 2019-04-15 DIAGNOSIS — K219 Gastro-esophageal reflux disease without esophagitis: Secondary | ICD-10-CM | POA: Diagnosis present

## 2019-04-15 DIAGNOSIS — B952 Enterococcus as the cause of diseases classified elsewhere: Secondary | ICD-10-CM

## 2019-04-15 DIAGNOSIS — G8929 Other chronic pain: Secondary | ICD-10-CM | POA: Diagnosis present

## 2019-04-15 DIAGNOSIS — I700 Atherosclerosis of aorta: Secondary | ICD-10-CM | POA: Diagnosis not present

## 2019-04-15 DIAGNOSIS — N261 Atrophy of kidney (terminal): Secondary | ICD-10-CM | POA: Diagnosis not present

## 2019-04-15 DIAGNOSIS — I10 Essential (primary) hypertension: Secondary | ICD-10-CM | POA: Diagnosis present

## 2019-04-15 DIAGNOSIS — E669 Obesity, unspecified: Secondary | ICD-10-CM | POA: Diagnosis present

## 2019-04-15 DIAGNOSIS — E869 Volume depletion, unspecified: Secondary | ICD-10-CM | POA: Diagnosis present

## 2019-04-15 DIAGNOSIS — I480 Paroxysmal atrial fibrillation: Secondary | ICD-10-CM | POA: Diagnosis present

## 2019-04-15 DIAGNOSIS — R918 Other nonspecific abnormal finding of lung field: Secondary | ICD-10-CM | POA: Diagnosis not present

## 2019-04-15 DIAGNOSIS — Z952 Presence of prosthetic heart valve: Secondary | ICD-10-CM

## 2019-04-15 DIAGNOSIS — N184 Chronic kidney disease, stage 4 (severe): Secondary | ICD-10-CM | POA: Diagnosis present

## 2019-04-15 DIAGNOSIS — Z8673 Personal history of transient ischemic attack (TIA), and cerebral infarction without residual deficits: Secondary | ICD-10-CM

## 2019-04-15 DIAGNOSIS — R6510 Systemic inflammatory response syndrome (SIRS) of non-infectious origin without acute organ dysfunction: Secondary | ICD-10-CM | POA: Diagnosis present

## 2019-04-15 DIAGNOSIS — R509 Fever, unspecified: Secondary | ICD-10-CM | POA: Diagnosis not present

## 2019-04-15 DIAGNOSIS — I482 Chronic atrial fibrillation, unspecified: Secondary | ICD-10-CM | POA: Diagnosis present

## 2019-04-15 DIAGNOSIS — I4819 Other persistent atrial fibrillation: Secondary | ICD-10-CM | POA: Diagnosis present

## 2019-04-15 DIAGNOSIS — R32 Unspecified urinary incontinence: Secondary | ICD-10-CM | POA: Diagnosis present

## 2019-04-15 DIAGNOSIS — E1165 Type 2 diabetes mellitus with hyperglycemia: Secondary | ICD-10-CM | POA: Diagnosis present

## 2019-04-15 DIAGNOSIS — Z823 Family history of stroke: Secondary | ICD-10-CM

## 2019-04-15 DIAGNOSIS — E1159 Type 2 diabetes mellitus with other circulatory complications: Secondary | ICD-10-CM | POA: Diagnosis present

## 2019-04-15 DIAGNOSIS — E1151 Type 2 diabetes mellitus with diabetic peripheral angiopathy without gangrene: Secondary | ICD-10-CM | POA: Diagnosis present

## 2019-04-15 DIAGNOSIS — Z808 Family history of malignant neoplasm of other organs or systems: Secondary | ICD-10-CM

## 2019-04-15 DIAGNOSIS — Z6832 Body mass index (BMI) 32.0-32.9, adult: Secondary | ICD-10-CM

## 2019-04-15 DIAGNOSIS — I130 Hypertensive heart and chronic kidney disease with heart failure and stage 1 through stage 4 chronic kidney disease, or unspecified chronic kidney disease: Secondary | ICD-10-CM | POA: Diagnosis present

## 2019-04-15 DIAGNOSIS — E11621 Type 2 diabetes mellitus with foot ulcer: Secondary | ICD-10-CM | POA: Diagnosis present

## 2019-04-15 DIAGNOSIS — K589 Irritable bowel syndrome without diarrhea: Secondary | ICD-10-CM | POA: Diagnosis present

## 2019-04-15 DIAGNOSIS — Z8249 Family history of ischemic heart disease and other diseases of the circulatory system: Secondary | ICD-10-CM

## 2019-04-15 DIAGNOSIS — Z8371 Family history of colonic polyps: Secondary | ICD-10-CM

## 2019-04-15 DIAGNOSIS — L8990 Pressure ulcer of unspecified site, unspecified stage: Secondary | ICD-10-CM | POA: Insufficient documentation

## 2019-04-15 DIAGNOSIS — R531 Weakness: Secondary | ICD-10-CM | POA: Diagnosis not present

## 2019-04-15 DIAGNOSIS — Z79899 Other long term (current) drug therapy: Secondary | ICD-10-CM

## 2019-04-15 DIAGNOSIS — I313 Pericardial effusion (noninflammatory): Secondary | ICD-10-CM | POA: Diagnosis not present

## 2019-04-15 DIAGNOSIS — R112 Nausea with vomiting, unspecified: Secondary | ICD-10-CM | POA: Diagnosis not present

## 2019-04-15 DIAGNOSIS — K5730 Diverticulosis of large intestine without perforation or abscess without bleeding: Secondary | ICD-10-CM | POA: Diagnosis not present

## 2019-04-15 DIAGNOSIS — I152 Hypertension secondary to endocrine disorders: Secondary | ICD-10-CM | POA: Diagnosis present

## 2019-04-15 DIAGNOSIS — E785 Hyperlipidemia, unspecified: Secondary | ICD-10-CM | POA: Diagnosis present

## 2019-04-15 DIAGNOSIS — L089 Local infection of the skin and subcutaneous tissue, unspecified: Secondary | ICD-10-CM | POA: Diagnosis present

## 2019-04-15 DIAGNOSIS — I878 Other specified disorders of veins: Secondary | ICD-10-CM | POA: Diagnosis present

## 2019-04-15 DIAGNOSIS — R1084 Generalized abdominal pain: Secondary | ICD-10-CM | POA: Diagnosis not present

## 2019-04-15 DIAGNOSIS — M160 Bilateral primary osteoarthritis of hip: Secondary | ICD-10-CM | POA: Diagnosis not present

## 2019-04-15 DIAGNOSIS — Z833 Family history of diabetes mellitus: Secondary | ICD-10-CM

## 2019-04-15 DIAGNOSIS — M810 Age-related osteoporosis without current pathological fracture: Secondary | ICD-10-CM | POA: Diagnosis present

## 2019-04-15 DIAGNOSIS — R4020 Unspecified coma: Secondary | ICD-10-CM | POA: Diagnosis not present

## 2019-04-15 DIAGNOSIS — L97419 Non-pressure chronic ulcer of right heel and midfoot with unspecified severity: Secondary | ICD-10-CM | POA: Diagnosis present

## 2019-04-15 DIAGNOSIS — I38 Endocarditis, valve unspecified: Secondary | ICD-10-CM

## 2019-04-15 DIAGNOSIS — G4730 Sleep apnea, unspecified: Secondary | ICD-10-CM | POA: Diagnosis present

## 2019-04-15 DIAGNOSIS — I2510 Atherosclerotic heart disease of native coronary artery without angina pectoris: Secondary | ICD-10-CM | POA: Diagnosis present

## 2019-04-15 DIAGNOSIS — L89892 Pressure ulcer of other site, stage 2: Secondary | ICD-10-CM | POA: Diagnosis present

## 2019-04-15 DIAGNOSIS — N183 Chronic kidney disease, stage 3 unspecified: Secondary | ICD-10-CM | POA: Diagnosis present

## 2019-04-15 DIAGNOSIS — I4891 Unspecified atrial fibrillation: Secondary | ICD-10-CM | POA: Diagnosis present

## 2019-04-15 DIAGNOSIS — Z794 Long term (current) use of insulin: Secondary | ICD-10-CM

## 2019-04-15 DIAGNOSIS — Z7901 Long term (current) use of anticoagulants: Secondary | ICD-10-CM

## 2019-04-15 DIAGNOSIS — E1122 Type 2 diabetes mellitus with diabetic chronic kidney disease: Secondary | ICD-10-CM | POA: Diagnosis present

## 2019-04-15 LAB — URINALYSIS, ROUTINE W REFLEX MICROSCOPIC
Bacteria, UA: NONE SEEN
Bilirubin Urine: NEGATIVE
Glucose, UA: >=500 mg/dL — AB
Hgb urine dipstick: NEGATIVE
Ketones, ur: NEGATIVE mg/dL
Leukocytes,Ua: NEGATIVE
Nitrite: NEGATIVE
Protein, ur: 30 mg/dL — AB
Specific Gravity, Urine: 1.012 (ref 1.005–1.030)
pH: 7 (ref 5.0–8.0)

## 2019-04-15 LAB — CBC WITH DIFFERENTIAL/PLATELET
Abs Immature Granulocytes: 0.04 10*3/uL (ref 0.00–0.07)
Basophils Absolute: 0.1 10*3/uL (ref 0.0–0.1)
Basophils Relative: 1 %
Eosinophils Absolute: 0.1 10*3/uL (ref 0.0–0.5)
Eosinophils Relative: 1 %
HCT: 44.7 % (ref 36.0–46.0)
Hemoglobin: 14.5 g/dL (ref 12.0–15.0)
Immature Granulocytes: 1 %
Lymphocytes Relative: 10 %
Lymphs Abs: 0.8 10*3/uL (ref 0.7–4.0)
MCH: 27.9 pg (ref 26.0–34.0)
MCHC: 32.4 g/dL (ref 30.0–36.0)
MCV: 86 fL (ref 80.0–100.0)
Monocytes Absolute: 0.6 10*3/uL (ref 0.1–1.0)
Monocytes Relative: 8 %
Neutro Abs: 6.6 10*3/uL (ref 1.7–7.7)
Neutrophils Relative %: 79 %
Platelets: 189 10*3/uL (ref 150–400)
RBC: 5.2 MIL/uL — ABNORMAL HIGH (ref 3.87–5.11)
RDW: 13.3 % (ref 11.5–15.5)
WBC: 8.2 10*3/uL (ref 4.0–10.5)
nRBC: 0 % (ref 0.0–0.2)

## 2019-04-15 LAB — COMPREHENSIVE METABOLIC PANEL
ALT: 18 U/L (ref 0–44)
AST: 19 U/L (ref 15–41)
Albumin: 3.3 g/dL — ABNORMAL LOW (ref 3.5–5.0)
Alkaline Phosphatase: 82 U/L (ref 38–126)
Anion gap: 15 (ref 5–15)
BUN: 38 mg/dL — ABNORMAL HIGH (ref 8–23)
CO2: 27 mmol/L (ref 22–32)
Calcium: 8.8 mg/dL — ABNORMAL LOW (ref 8.9–10.3)
Chloride: 95 mmol/L — ABNORMAL LOW (ref 98–111)
Creatinine, Ser: 2.33 mg/dL — ABNORMAL HIGH (ref 0.44–1.00)
GFR calc Af Amer: 24 mL/min — ABNORMAL LOW (ref >60)
GFR calc non Af Amer: 21 mL/min — ABNORMAL LOW (ref >60)
Glucose, Bld: 249 mg/dL — ABNORMAL HIGH (ref 70–99)
Potassium: 3.7 mmol/L (ref 3.5–5.1)
Sodium: 137 mmol/L (ref 135–145)
Total Bilirubin: 0.7 mg/dL (ref 0.3–1.2)
Total Protein: 6.6 g/dL (ref 6.5–8.1)

## 2019-04-15 LAB — LACTIC ACID, PLASMA: Lactic Acid, Venous: 1.7 mmol/L (ref 0.5–1.9)

## 2019-04-15 LAB — LIPASE, BLOOD: Lipase: 44 U/L (ref 11–51)

## 2019-04-15 LAB — CBG MONITORING, ED: Glucose-Capillary: 270 mg/dL — ABNORMAL HIGH (ref 70–99)

## 2019-04-15 MED ORDER — ONDANSETRON HCL 4 MG/2ML IJ SOLN
4.0000 mg | Freq: Once | INTRAMUSCULAR | Status: AC
  Administered 2019-04-15: 4 mg via INTRAVENOUS
  Filled 2019-04-15: qty 2

## 2019-04-15 MED ORDER — SODIUM CHLORIDE 0.9 % IV BOLUS
1000.0000 mL | Freq: Once | INTRAVENOUS | Status: AC
  Administered 2019-04-16: 1000 mL via INTRAVENOUS

## 2019-04-15 MED ORDER — ACETAMINOPHEN 325 MG PO TABS
650.0000 mg | ORAL_TABLET | Freq: Once | ORAL | Status: AC
  Administered 2019-04-15: 650 mg via ORAL
  Filled 2019-04-15: qty 2

## 2019-04-15 MED ORDER — SODIUM CHLORIDE 0.9 % IV SOLN
2.0000 g | Freq: Once | INTRAVENOUS | Status: AC
  Administered 2019-04-16: 2 g via INTRAVENOUS
  Filled 2019-04-15: qty 2

## 2019-04-15 MED ORDER — VANCOMYCIN HCL 10 G IV SOLR
1500.0000 mg | Freq: Once | INTRAVENOUS | Status: AC
  Administered 2019-04-16: 1500 mg via INTRAVENOUS
  Filled 2019-04-15: qty 1500

## 2019-04-15 NOTE — ED Notes (Signed)
CBG Results of 270 reported to Maudie Mercury, Therapist, sports.

## 2019-04-15 NOTE — ED Provider Notes (Signed)
[facility] EMERGENCY DEPARTMENT Provider Note   CSN: [id number] Arrival date & time: 04/15/19  2009     History   Chief Complaint Chief Complaint  Patient presents with   Weakness    HPI [name] is a 68 y.o. female.     HPI 68 year old female presents with weakness.  She was on her bedside commode and then slipped off.  EMS was called by the daughter.  They told EMS that she has been weak and altered for about a week.  Did not know she had a fever but has a temperature of 100.4 here.  She endorses no headache, cough, chest pain or shortness of breath.  Some abdominal pain though when asked how long, she cannot clarify.  She also states she has a history of on and off vomiting and vomited today.  Chronic right heel wound as well as wounds on her second and third left toes that she feels are pretty stable.  No drainage.  Chronic back pain that is not new.  Past Medical History:  Diagnosis Date   Atrial fibrillation ([place])    a. maintaining sinus s/p DCCV, on Eliquis   CAD (coronary artery disease)    a. Nonobstructive by cath 2006 - 25% LAD, 25-30% after 1st diag, distal 25% PDA, minor irreg LCx. b. Normal nuc 2011.   CKD (chronic kidney disease), stage III ([place])    Cystocele    Difficult intubation 09-07-2012   big neck trouble with intubation parotid surgery"takes little med to sedate"   Esophageal stricture    GERD (gastroesophageal reflux disease)    Hiatal hernia    a. 07/2013: HH with small stricture holding up barium tablet (concides with patient's sx of food sticking).   History of kidney stones    Hyperlipidemia    Hypertension    IBS (irritable bowel syndrome)    Memory difficulty 11/26/2015   Morbid obesity ([place])    a. 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   Pericardial effusion    a. Mod by echo 2012 at time of PNA. b. Echo 07/2012:  small pericardial effusion vs fat.   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   Acute on chronic diastolic CHF (congestive heart failure) ([place]) 05/10/2018   Morbid obesity ([place])    Hypertension    CKD (chronic kidney disease), stage III ([facility])    Sleep apnea    Hyperlipidemia    GERD (gastroesophageal reflux disease)    Atrial fibrillation ([facility])    S/P TAVR (transcatheter aortic valve replacement)    Severe aortic stenosis    Vertigo 05/06/2015   TIA (transient ischemic attack), history of 11/07/2014   History of gastric bypass 06/30/2014   CKD stage 3 due to type 2 diabetes mellitus ([place]) 06/03/2014   Type II diabetes mellitus with renal manifestations ([place]) 06/03/2014   Type II diabetes mellitus with peripheral circulatory disorder ([place]) 06/03/2014   Benign  essential HTN 12/26/2008   Coronary artery disease, non-occlusive 12/26/2008   Pericardial effusion 12/26/2008    Past Surgical History:  Procedure Laterality Date   BREATH TEK H PYLORI N/A 07/29/2013   Procedure: BREATH TEK H PYLORI;  Surgeon: Shann Medal, MD;  Location: [facility];  Service: General;  Laterality: N/A;   CARDIAC CATHETERIZATION  2006   CARDIOVERSION N/A 02/16/2018   Procedure: CARDIOVERSION;  Surgeon: Pixie Casino, MD;  Location: [facility] ENDOSCOPY;  Service: Cardiovascular;  Laterality: N/A;   CARPAL TUNNEL RELEASE Left 2011   CATARACT EXTRACTION W/ INTRAOCULAR LENS  IMPLANT, BILATERAL Bilateral 2003   left   CHOLECYSTECTOMY OPEN  1982   COLONOSCOPY N/A  07/14/2015   Procedure: COLONOSCOPY;  Surgeon: Ladene Artist, MD;  Location: [facility];  Service: Endoscopy;  Laterality: N/A;   ESOPHAGOGASTRODUODENOSCOPY N/A 12/27/2013   Procedure: ESOPHAGOGASTRODUODENOSCOPY (EGD);  Surgeon: Shann Medal, MD;  Location: [facility];  Service: General;  Laterality: N/A;   ESOPHAGOGASTRODUODENOSCOPY (EGD) WITH ESOPHAGEAL DILATION     INTRAOPERATIVE TRANSTHORACIC ECHOCARDIOGRAM  05/08/2018   Procedure: INTRAOPERATIVE TRANSTHORACIC ECHOCARDIOGRAM;  Surgeon: Burnell Blanks, MD;  Location: [facility];  Service: Open Heart Surgery;;   LAPAROSCOPIC GASTRIC SLEEVE RESECTION N/A 01/20/2014   Procedure: LAPAROSCOPIC GASTRIC SLEEVE RESECTION;  Surgeon: Shann Medal, MD;  Location: [facility];  Service: General;  Laterality: N/A;   PAROTIDECTOMY  09/07/2012   Procedure: PAROTIDECTOMY;  Surgeon: Melida Quitter, MD;  Location: [facility];  Service: ENT;  Laterality: Left;  LEFT PAROTIDECTOMY   RIGHT HEART CATH N/A 09/25/2018   Procedure: RIGHT HEART CATH;  Surgeon: Larey Dresser, MD;  Location: [facility] CV LAB;  Service: Cardiovascular;  Laterality: N/A;   RIGHT/LEFT HEART CATH AND CORONARY ANGIOGRAPHY N/A 03/29/2018   Procedure: RIGHT/LEFT HEART CATH AND CORONARY ANGIOGRAPHY;  Surgeon: Burnell Blanks, MD;  Location: [facility];  Service: Cardiovascular;  Laterality: N/A;   TEE WITHOUT CARDIOVERSION N/A 02/16/2018   Procedure: TRANSESOPHAGEAL ECHOCARDIOGRAM (TEE);  Surgeon: Pixie Casino, MD;  Location[facility] END[facility]COPY;  Service: Cardiovascular;  Laterality: N/A;   TOE AMPUTATION Left 07/2017   great toe; [facility]    TONSILLECTOMY  1968   TRANSCATHETER AORTIC VALVE REPLACEMENT, TRANSFEMORAL  05/08/2018   Transcatheter Aortic Valve Replacement    TRANSCATHETER AORTIC VALVE REPLACEMENT, TRANSFEMORAL N/A 05/08/2018   Procedure: TRANSCATHETER AORTIC VALVE REPLACEMENT, TRANSFEMORAL;  Surgeon: Burnell Blanks, MD;  Location: [facility];  Service:  Open Heart Surgery;  Laterality: N/A;   TUBAL LIGATION  yrs ago   UPPER GI ENDOSCOPY  01/20/2014   Procedure: UPPER GI ENDOSCOPY;  Surgeon: Shann Medal, MD;  Location: [facility];  Service: General;;     OB History    Gravida  3   Para  3   Term  3   Preterm      AB      Living  3     SAB      TAB      Ectopic      Multiple      Live Births  3            Home Medications    Prior to Admission medications   Medication Sig Start Date End Date Taking? Authorizing Provider  ACCU-CHEK AVIVA PLUS test strip USE TO CHECK BLOOD SUGAR UP TO TWICE DAILY OR AS INSTRUCTED 09/04/17   Chipper Herb, MD  Alirocumab (PRALUENT) 75 MG/ML SOAJ Inject 75 mg into the skin  every 14 (fourteen) days. 04/03/19   Larey Dresser, MD  B-D INS SYR ULTRAFINE 1CC/31G 31G X 5/16" 1 ML MISC USE TO INJECT INSULIN TWICE A DAY AS INSTRUCTED Patient taking differently: 1 Stick by Other route 3 (three) times daily before meals.  11/16/15   Chipper Herb, MD  Calcium Carbonate-Vitamin D (CALCIUM 600+D) 600-400 MG-UNIT per tablet Take 1 tablet by mouth 2 (two) times daily. Patient taking differently: Take 1 tablet by mouth 3 (three) times a week.  05/06/15   Cherre Robins, PharmD  carvedilol (COREG) 25 MG tablet TAKE 1 TABLET BY MOUTH TWICE A DAY 04/02/19   Larey Dresser, MD  Cholecalciferol (VITAMIN D3) 50 MCG (2000 UT) TABS Take 2,000 Units by mouth daily.    [provider]  ciprofloxacin (CIPRO) 500 MG tablet Take 1 tablet (500 mg total) by mouth 2 (two) times daily. 03/22/19   Hassell Done, Mary-Margaret, FNP  clindamycin (CLEOCIN) 300 MG capsule Take 2 tablets (600 mg) one hour prior to dental visits. 05/24/18   Eileen Stanford, PA-C  ELIQUIS 5 MG TABS tablet TAKE 1 TABLET BY MOUTH TWICE A DAY 02/27/19   Minus Breeding, MD  fenofibrate (TRICOR) 48 MG tablet Take 1 tablet (48 mg total) by mouth daily. 03/14/19   Larey Dresser, MD  hydrALAZINE (APRESOLINE) 10 MG tablet Take 1 tablet (10 mg  total) by mouth every morning AND 2 tablets (20 mg total) daily with lunch AND 2 tablets (20 mg total) at bedtime. 03/14/19   Larey Dresser, MD  insulin aspart protamine- aspart (NOVOLOG MIX 70/30) (70-30) 100 UNIT/ML injection Inject into the skin. Inject 75u in am 60u at lunch and at bedtime    [provider]  Insulin Pen Needle 32G X 4 MM MISC Use to inject insulin with insulin pen 05/17/16   Chipper Herb, MD  LIVALO 4 MG TABS TAKE 1 TABLET BY MOUTH EVERY DAY 03/22/19   Ronnie Doss M, DO  meclizine (ANTIVERT) 12.5 MG tablet Take 12.5 mg by mouth 3 (three) times daily as needed for dizziness.    [provider]  mupirocin ointment (BACTROBAN) 2 % Apply 1 application topically 2 (two) times daily. 06/19/18   Chipper Herb, MD  ondansetron (ZOFRAN ODT) 4 MG disintegrating tablet Take 1 tablet (4 mg total) by mouth every 8 (eight) hours as needed for nausea. 01/03/17   Larene Pickett, PA-C  Prenatal Vit-Fe Fumarate-FA (PRENATAL MULTIVITAMIN) TABS tablet Take 1 tablet by mouth 3 (three) times a week.    [provider]  Propylene Glycol (SYSTANE COMPLETE) 0.6 % SOLN Apply 1 drop to eye daily as needed (dry eyes).     [provider]  sodium chloride (OCEAN) 0.65 % SOLN nasal spray Place 1 spray into both nostrils as needed for congestion. 11/09/14   Domenic Polite, MD  torsemide (DEMADEX) 20 MG tablet Take 1 tablet (20 mg total) by mouth 2 (two) times daily. 03/14/19   Larey Dresser, MD  vitamin E 400 UNIT capsule Take 400 Units by mouth 3 (three) times a week.     [provider]  Olmesartan-Amlodipine-HCTZ (TRIBENZOR) 40-5-12.5 MG TABS Take 1 tablet by mouth daily.    04/12/18  [provider]    Family History Family History  Problem Relation Age of Onset   Heart failure Mother    Hypertension Mother    Skin cancer Mother    Colitis Mother    Diabetes Mother  Kidney Stones Mother    Stroke Mother    Heart disease  Father    Colon polyps Father    Diabetes Father    Heart failure Father    Heart attack Sister    Diabetes Other    Stroke Maternal Grandfather    Diabetes Maternal Grandfather    Colon cancer Neg Hx     Social History Social History   Tobacco Use   Smoking status: Never Smoker   Smokeless tobacco: Never Used  Substance Use Topics   Alcohol use: Yes    Alcohol/week: 0.0 standard drinks    Comment:  "mixed drink q few years"   Drug use: Never     Allergies   Amlodipine, Atorvastatin, Crestor [rosuvastatin calcium], Ezetimibe-simvastatin, Statins, Celebrex [celecoxib], Levemir [insulin detemir], and Penicillins   Review of Systems Review of Systems  Constitutional: Negative for fever.  Respiratory: Negative for cough and shortness of breath.   Cardiovascular: Negative for chest pain.  Gastrointestinal: Positive for abdominal pain and vomiting.  Musculoskeletal: Positive for back pain.  Neurological: Positive for weakness.  All other systems reviewed and are negative.    Physical Exam Updated Vital Signs BP (!) 188/84    Pulse 88    Temp 98.6 F (37 C) (Oral)    Resp 16    SpO2 96%   Physical Exam Vitals signs and nursing note reviewed.  Constitutional:      General: She is not in acute distress.    Appearance: She is well-developed. She is obese. She is not ill-appearing or diaphoretic.  HENT:     Head: Normocephalic and atraumatic.     Right Ear: External ear normal.     Left Ear: External ear normal.     Nose: Nose normal.  Eyes:     General:        Right eye: No discharge.        Left eye: No discharge.  Cardiovascular:     Rate and Rhythm: Normal rate and regular rhythm.     Heart sounds: Normal heart sounds.  Pulmonary:     Effort: Pulmonary effort is normal.     Breath sounds: Normal breath sounds.  Abdominal:     Palpations: Abdomen is soft.     Tenderness: There is abdominal tenderness (generalized, makes patient feel nauseous).    Musculoskeletal:     Comments: Superficial wound to right heel. No drainage. No cellulitis Very small wounds to dorsal 2nd and 3rd toes on left  Skin:    General: Skin is warm and dry.  Neurological:     Mental Status: She is alert and oriented to person, place, and time.     Comments: Awake, alert, oriented x 3. Slow to answer some questions and at times seems confused.  Psychiatric:        Mood and Affect: Mood is not anxious.      ED Treatments / Results  Labs (all labs ordered are listed, but only abnormal results are displayed) Labs Reviewed  COMPREHENSIVE METABOLIC PANEL - Abnormal; Notable for the following components:      Result Value   Chloride 95 (*)    Glucose, Bld 249 (*)    BUN 38 (*)    Creatinine, Ser 2.33 (*)    Calcium 8.8 (*)    Albumin 3.3 (*)    GFR calc non Af Amer 21 (*)    GFR calc Af Amer 24 (*)    All other components within normal  limits  CBC WITH DIFFERENTIAL/PLATELET - Abnormal; Notable for the following components:   RBC 5.20 (*)    All other components within normal limits  URINALYSIS, ROUTINE W REFLEX MICROSCOPIC - Abnormal; Notable for the following components:   Glucose, UA >=500 (*)    Protein, ur 30 (*)    All other components within normal limits  CBG MONITORING, ED - Abnormal; Notable for the following components:   Glucose-Capillary 270 (*)    All other components within normal limits  CULTURE, BLOOD (ROUTINE X 2)  CULTURE, BLOOD (ROUTINE X 2)  URINE CULTURE  SARS CORONAVIRUS 2 (HOSPITAL ORDER, PERFORMED IN [facility])  LACTIC ACID, PLASMA  LIPASE, BLOOD  LACTIC ACID, PLASMA    EKG EKG Interpretation  Date/Time:  Monday April 15 2019 21:44:21 EDT Ventricular Rate:  88 PR Interval:    QRS Duration: 94 QT Interval:  376 QTC Calculation: 455 R Axis:   104 Text Interpretation:  Sinus rhythm Right axis deviation Repol abnrm, severe global ischemia (LM/MVD) no significant change since July 2020 Confirmed by  Sherwood Gambler [phone]) on 04/15/2019 9:50:07 PM   Radiology Ct Abdomen Pelvis Wo Contrast  Result Date: 04/15/2019 CLINICAL DATA:  Abdominal pain, fever, suspected abscess, nausea vomiting. EXAM: CT ABDOMEN AND PELVIS WITHOUT CONTRAST TECHNIQUE: Multidetector CT imaging of the abdomen and pelvis was performed following the standard protocol without IV contrast. COMPARISON:  CT renal colic Jan 04, 8015 FINDINGS: Lower chest: Bandlike areas of opacity in the lung bases may reflect a combination of atelectasis and/or scarring. There is prominent pericardial fat. There is a small pericardial effusion which is similar to comparison CT. Cardiac size is at the upper limits of normal. Transcatheter aortic valve replacement is noted. Dense mitral annular calcifications are seen. Atherosclerotic calcification of the coronary arteries. Hepatobiliary: No focal liver abnormality is seen. Patient is post cholecystectomy. Slight prominence of the biliary tree likely related to reservoir effect. No calcified intraductal gallstones. Pancreas: Unremarkable. No pancreatic ductal dilatation or surrounding inflammatory changes. Spleen: Normal in size without focal abnormality. Small accessory splenule. Adrenals/Urinary Tract: Normal adrenal glands. The kidneys appear diminutive in size and atrophic. There is focal Peri in a lymph a may shin in the upper pole of the left kidney though this is overall similar to comparison CT dated Jan 03, 2017. No visible renal lesions. No urolithiasis or hydronephrosis. Normal appearance of the bladder. Stomach/Bowel: Distal esophagus is unremarkable there are postsurgical changes from prior gastric sleeve. Duodenal sweep is normal course without focal abnormality. No small bowel dilatation or wall thickening. A normal appendix is visualized. Scattered colonic diverticula without focal pericolonic inflammation to suggest diverticulitis. No abnormal colonic thickening or dilatation.  Vascular/Lymphatic: Extensive atheromatous plaque throughout the aorta and branch vessels. No suspicious or enlarged lymph nodes in the included lymphatic chains. Reproductive: Normal appearance of the uterus and adnexal structures. Other: No abdominopelvic free fluid or free gas. No bowel containing hernias. Musculoskeletal: There is marked laxity of the anterior abdominal wall which is more pronounced than on comparison priors with atrophy of the rectus muscle sheath. No acute osseous abnormality or suspicious osseous lesion. 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 Multilevel degenerative changes are present in the imaged portions of the spine. Additional degenerative features in the pelvis and hips. IMPRESSION: 1. No acute intra-abdominal process. Specifically, no evidence of abdominopelvic abscess or infection. 2. Marked laxity of the anterior abdominal wall, more pronounced than on comparison priors. No bowel  containing hernias. 3. Prior transcatheter aortic valve replacement. 4. Atrophic appearance of the kidneys, correlate with renal function. 5. Stable pericardial effusion. 6. Prior gastric sleeve. 7. Aortic Atherosclerosis (ICD10-I70.0). Electronically Signed   By: Lovena Le M.D.   On: 04/15/2019 23:02   Ct Head Wo Contrast  Result Date: 04/15/2019 CLINICAL DATA:  Altered level of consciousness, nausea EXAM: CT HEAD WITHOUT CONTRAST TECHNIQUE: Contiguous axial images were obtained from the base of the skull through the vertex without intravenous contrast. COMPARISON:  MR, CT 06/16/2018 FINDINGS: Brain: No evidence of acute infarction, hemorrhage, hydrocephalus, extra-axial collection or mass lesion/mass effect. Symmetric prominence of the ventricles, cisterns and sulci compatible with parenchymal volume loss. Patchy areas of white matter hypoattenuation are most compatible with chronic microvascular angiopathy. Senescent  mineralization of the basal ganglia bilaterally. Benign dural calcifications. Vascular: Atherosclerotic calcification of the carotid siphons and intradural vertebral arteries. No hyperdense vessel. Skull: Hyperostosis frontalis interna, a benign incidental finding. No calvarial fracture or suspicious osseous lesion. No scalp swelling or hematoma. Sinuses/Orbits: Paranasal sinuses and mastoid air cells are predominantly clear. Orbital structures are unremarkable aside from prior lens extractions. Other: None. IMPRESSION: No acute intracranial abnormality. Chronic microvascular angiopathy, volume loss and intracranial atherosclerosis. Electronically Signed   By: Lovena Le M.D.   On: 04/15/2019 23:05   Dg Chest Port 1 View  Result Date: 04/15/2019 CLINICAL DATA:  Fever EXAM: PORTABLE CHEST 1 VIEW COMPARISON:  September 05, 2018 FINDINGS: The patient is status post prior TAVR. The heart size remains enlarged. Aortic calcifications are noted. There is no focal infiltrate. The previously noted left-sided airspace opacity has improved. There is no significant pleural effusion. No significant pneumothorax. There may be some mild volume overload without overt pulmonary edema. IMPRESSION: No active disease. Electronically Signed   By: Constance Holster M.D.   On: 04/15/2019 21:22   Dg Foot Complete Right  Result Date: 04/15/2019 CLINICAL DATA:  He will pressure. EXAM: RIGHT FOOT COMPLETE - 3+ VIEW COMPARISON:  None. FINDINGS: There is no acute displaced fracture or dislocation. A moderate-sized plantar calcaneal spur is noted. Advanced vascular calcifications are noted. There is no radiopaque foreign body. No significant surrounding soft tissue swelling. IMPRESSION: No acute osseous abnormality. Electronically Signed   By: Constance Holster M.D.   On: 04/15/2019 21:23    Procedures Procedures (including critical care time)  Medications Ordered in ED Medications  sodium chloride 0.9 % bolus 1,000 mL (has no  administration in time range)  acetaminophen (TYLENOL) tablet 650 mg (650 mg Oral Given 04/15/19 2129)  ondansetron (ZOFRAN) injection 4 mg (4 mg Intravenous Given 04/15/19 2058)     Initial Impression / Assessment and Plan / ED Course  I have reviewed the triage vital signs and the nursing notes.  Pertinent labs & imaging results that were available during my care of the patient were reviewed by me and considered in my medical decision making (see chart for details).        No clear source for the patient's fever.  I discussed with the husband and he notes she has been a little bit confused today.  Unclear if this is fever alone or not.  Her labs are relatively near baseline.  Given no UTI or obvious bacterial illness, this could be viral, COVID testing is pending.  I think meningitis/encephalitis is less likely.  She is awake and alert and oriented here though seems a little bit off.  Could be from the heel wounds though they are not  impressive.  I will give broad antibiotics and get blood cultures and for now she will need at least observation and further supportive care. Dr. Jonelle Sidle to admit.  [name] was evaluated in Emergency Department on 04/15/2019 for the symptoms described in the history of present illness. She was evaluated in the context of the global COVID-19 pandemic, which necessitated consideration that the patient might be at risk for infection with the SARS-CoV-2 virus that causes COVID-19. Institutional protocols and algorithms that pertain to the evaluation of patients at risk for COVID-19 are in a state of rapid change based on information released by regulatory bodies including the CDC and federal and state organizations. These policies and algorithms were followed during the patient's care in the ED.   Final Clinical Impressions(s) / ED Diagnoses   Final diagnoses:  Fever in adult    ED Discharge Orders    None       Sherwood Gambler, MD 04/15/19 [phone]

## 2019-04-15 NOTE — ED Notes (Signed)
Pt st's nausea is better at this time.  Tylenol po given

## 2019-04-15 NOTE — ED Triage Notes (Addendum)
Patient is from home, becoming progressively weak in the last week per family.  She is nauseated upon arrival to ED.  She has a low grade fever per EMS.  Right heel has a pressure ulcer on the lateral aspect of the heel.  Patient seems confused to family, but does answer questions appropriately with EMS, slow to answer questions.  Patient was given 50mcg Fentanyl en route for heel pain.

## 2019-04-15 NOTE — H&P (Signed)
History and Physical   [name] IRW:[id number] DOB: Jan 30, 1951 DOA: 04/15/2019  Referring MD/NP/PA: Dr. Regenia Skeeter  PCP: [name], DO   Outpatient Specialists: None  Patient coming from: Home  Chief Complaint: Weakness  HPI: [name] is a 68 y.o. female with medical history significant of atrial fibrillation, chronic kidney disease stage III, morbid obesity, GERD with esophageal stricture, hyperlipidemia, hypertension, IBS, osteoporosis who presented with significant weakness which is global.  Patient said she has gradually felt weak unable to move around.  She was on her bedside commode at home when she slipped off and fell.  She could not get up from the floor.  She called her daughter who now called EMS.  Patient was seen by EMS patient was slightly altered.  She denied any fever or chills denied any exposure to cold or any sick contact.  When she arrived the ER temperature was 100.4.  Patient denied any abdominal pain or diarrhea no other source of her low-grade temperature.  She was globally noted to be weak however.  She has chronic right heel wound which is chronic and then stable.  No obvious drainage and no obvious infection.  Patient is being admitted for observation and management of her weakness.  ED Course: Temperature is 100.4 blood pressure 188/84 pulse 99 respirate of 24 and oxygen sat 87% room air.  Sodium 137 potassium 3.7 chloride 95 CO2 27 glucose 249 BUN is 38 and creatinine 2.33 calcium 8.8 gap of 15.  CBC is within normal.   CT abdomen pelvis showed No acute abdominal process.Head CT without contrast is negative.  Review of Systems: As per HPI otherwise 10 point review of systems negative.    Past Medical History:  Diagnosis Date   Atrial fibrillation ([place])    a. maintaining sinus s/p DCCV, on Eliquis   CAD (coronary artery disease)    a. Nonobstructive by cath 2006 - 25% LAD, 25-30% after 1st diag, distal 25% PDA, minor irreg LCx. b. Normal  nuc 2011.   CKD (chronic kidney disease), stage III ([place])    Cystocele    Difficult intubation 09-07-2012   big neck trouble with intubation parotid surgery"takes little med to sedate"   Esophageal stricture    GERD (gastroesophageal reflux disease)    Hiatal hernia    a. 07/2013: HH with small stricture holding up barium tablet (concides with patient's sx of food sticking).   History of kidney stones    Hyperlipidemia    Hypertension    IBS (irritable bowel syndrome)    Memory difficulty 11/26/2015   Morbid obesity ([place])    a. 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   Pericardial effusion    a. Mod by echo 2012 at time of PNA. b. Echo 07/2012: small pericardial effusion vs fat.   S/P TAVR (transcatheter aortic valve replacement)    Severe aortic stenosis    Sleep apnea     "have mask; don't use it" (02/14/2018)   TIA (transient ischemic attack) 10/2014; 06/2016   "some memory issues since; daughter says my speech is sometimes different" (02/14/2018)   Type II diabetes mellitus ([place])     Past Surgical History:  Procedure Laterality Date   BREATH TEK H PYLORI N/A 07/29/2013   Procedure: BREATH TEK H PYLORI;  Surgeon: Shann Medal, MD;  Location: [facility];  Service: General;  Laterality: N/A;  CARDIAC CATHETERIZATION  2006   CARDIOVERSION N/A 02/16/2018   Procedure: CARDIOVERSION;  Surgeon: Pixie Casino, MD;  Location: [facility] END[facility];  Service: Cardiovascular;  Laterality: N/A;   CARPAL TUNNEL RELEASE Left 2011   CATARACT EXTRACTION W/ INTRAOCULAR LENS  IMPLANT, BILATERAL Bilateral 2003   left   CHOLECYSTECTOMY OPEN  1982   COLONOSCOPY N/A 07/14/2015   Procedure: COLONOSCOPY;  Surgeon: Ladene Artist, MD;  Location: [facility] ENDOSCOPY;  Service: Endoscopy;  Laterality: N/A;   ESOPHAGOGASTRODUODENOSCOPY N/A 12/27/2013   Procedure: ESOPHAGOGASTRODUODENOSCOPY (EGD);   Surgeon: Shann Medal, MD;  Location: [facility];  Service: General;  Laterality: N/A;   ESOPHAGOGASTRODUODENOSCOPY (EGD) WITH ESOPHAGEAL DILATION     INTRAOPERATIVE TRANSTHORACIC ECHOCARDIOGRAM  05/08/2018   Procedure: INTRAOPERATIVE TRANSTHORACIC ECHOCARDIOGRAM;  Surgeon: Burnell Blanks, MD;  Location: [place];  Service: Open Heart Surgery;;   LAPAROSCOPIC GASTRIC SLEEVE RESECTION N/A 01/20/2014   Procedure: LAPAROSCOPIC GASTRIC SLEEVE RESECTION;  Surgeon: Shann Medal, MD;  Location: [facility];  Service: General;  Laterality: N/A;   PAROTIDECTOMY  09/07/2012   Procedure: PAROTIDECTOMY;  Surgeon: Melida Quitter, MD;  Location: [place];  Service: ENT;  Laterality: Left;  LEFT PAROTIDECTOMY   RIGHT HEART CATH N/A 09/25/2018   Procedure: RIGHT HEART CATH;  Surgeon: Larey Dresser, MD;  Location: [place] CV LAB;  Service: Cardiovascular;  Laterality: N/A;   RIGHT/LEFT HEART CATH AND CORONARY ANGIOGRAPHY N/A 03/29/2018   Procedure: RIGHT/LEFT HEART CATH AND CORONARY ANGIOGRAPHY;  Surgeon: Burnell Blanks, MD;  Location: [place] [facility];  Service: Cardiovascular;  Laterality: N/A;   TEE WITHOUT CARDIOVERSION N/A 02/16/2018   Procedure: TRANSESOPHAGEAL ECHOCARDIOGRAM (TEE);  Surgeon: Pixie Casino, MD;  Location: [facility] ENDOSCOPY;  Service: Cardiovascular;  Laterality: N/A;   TOE AMPUTATION Left 07/2017   great toe; [facility]    TONSILLECTOMY  1968   TRANSCATHETER AORTIC VALVE REPLACEMENT, TRANSFEMORAL  05/08/2018   Transcatheter Aortic Valve Replacement    TRANSCATHETER AORTIC VALVE REPLACEMENT, TRANSFEMORAL N/A 05/08/2018   Procedure: TRANSCATHETER AORTIC VALVE REPLACEMENT, TRANSFEMORAL;  Surgeon: Burnell Blanks, MD;  Location: [place];  Service: Open Heart Surgery;  Laterality: N/A;   TUBAL LIGATION  yrs ago   UPPER GI ENDOSCOPY  01/20/2014   Procedure: UPPER GI ENDOSCOPY;  Surgeon: Shann Medal, MD;  Location: [facility];  Service: General;;     reports that she has  never smoked. She has never used smokeless tobacco. She reports current alcohol use. She reports that she does not use drugs.  Allergies  Allergen Reactions   Amlodipine Swelling    Ankle swelling   Atorvastatin Other (See Comments)    Myalgias    Crestor [Rosuvastatin Calcium] Other (See Comments)    Stiffness and back pain   Ezetimibe-Simvastatin Other (See Comments)    Leg cramps   Statins Other (See Comments)    Myalgias   Celebrex [Celecoxib] Rash   Levemir [Insulin Detemir] Rash   Penicillins Other (See Comments)    SYNCOPE (Tolerated Ancef) Has patient had a PCN reaction causing immediate rash, facial/tongue/throat swelling, SOB or lightheadedness with hypotension: Yes Has patient had a PCN reaction causing severe rash involving mucus membranes or skin necrosis: No Has patient had a PCN reaction that required hospitalization No Has patient had a PCN reaction occurring within the last 10 years: No If all of the above answers are "NO", then may proceed with Cephalosporin use.    Family History  Problem Relation Age of Onset   Heart  failure Mother    Hypertension Mother    Skin cancer Mother    Colitis Mother    Diabetes Mother    Kidney Stones Mother    Stroke Mother    Heart disease Father    Colon polyps Father    Diabetes Father    Heart failure Father    Heart attack Sister    Diabetes Other    Stroke Maternal Grandfather    Diabetes Maternal Grandfather    Colon cancer Neg Hx      Prior to Admission medications   Medication Sig Start Date End Date Taking? Authorizing Provider  ACCU-CHEK AVIVA PLUS test strip USE TO CHECK BLOOD SUGAR UP TO TWICE DAILY OR AS INSTRUCTED 09/04/17   Chipper Herb, MD  Alirocumab (PRALUENT) 75 MG/ML SOAJ Inject 75 mg into the skin every 14 (fourteen) days. 04/03/19   Larey Dresser, MD  B-D INS SYR ULTRAFINE 1CC/31G 31G X 5/16" 1 ML MISC USE TO INJECT INSULIN TWICE A DAY AS INSTRUCTED Patient taking  differently: 1 Stick by Other route 3 (three) times daily before meals.  11/16/15   Chipper Herb, MD  Calcium Carbonate-Vitamin D (CALCIUM 600+D) 600-400 MG-UNIT per tablet Take 1 tablet by mouth 2 (two) times daily. Patient taking differently: Take 1 tablet by mouth 3 (three) times a week.  05/06/15   Cherre Robins, PharmD  carvedilol (COREG) 25 MG tablet TAKE 1 TABLET BY MOUTH TWICE A DAY 04/02/19   Larey Dresser, MD  Cholecalciferol (VITAMIN D3) 50 MCG (2000 UT) TABS Take 2,000 Units by mouth daily.    [provider]  ciprofloxacin (CIPRO) 500 MG tablet Take 1 tablet (500 mg total) by mouth 2 (two) times daily. 03/22/19   Hassell Done, Mary-Margaret, FNP  clindamycin (CLEOCIN) 300 MG capsule Take 2 tablets (600 mg) one hour prior to dental visits. 05/24/18   Eileen Stanford, PA-C  ELIQUIS 5 MG TABS tablet TAKE 1 TABLET BY MOUTH TWICE A DAY 02/27/19   Minus Breeding, MD  fenofibrate (TRICOR) 48 MG tablet Take 1 tablet (48 mg total) by mouth daily. 03/14/19   Larey Dresser, MD  hydrALAZINE (APRESOLINE) 10 MG tablet Take 1 tablet (10 mg total) by mouth every morning AND 2 tablets (20 mg total) daily with lunch AND 2 tablets (20 mg total) at bedtime. 03/14/19   Larey Dresser, MD  insulin aspart protamine- aspart (NOVOLOG MIX 70/30) (70-30) 100 UNIT/ML injection Inject into the skin. Inject 75u in am 60u at lunch and at bedtime    [provider]  Insulin Pen Needle 32G X 4 MM MISC Use to inject insulin with insulin pen 05/17/16   Chipper Herb, MD  LIVALO 4 MG TABS TAKE 1 TABLET BY MOUTH EVERY DAY 03/22/19   Ronnie Doss M, DO  meclizine (ANTIVERT) 12.5 MG tablet Take 12.5 mg by mouth 3 (three) times daily as needed for dizziness.    [provider]  mupirocin ointment (BACTROBAN) 2 % Apply 1 application topically 2 (two) times daily. 06/19/18   Chipper Herb, MD  ondansetron (ZOFRAN ODT) 4 MG disintegrating tablet Take 1 tablet (4 mg total) by mouth every 8 (eight)  hours as needed for nausea. 01/03/17   Larene Pickett, PA-C  Prenatal Vit-Fe Fumarate-FA (PRENATAL MULTIVITAMIN) TABS tablet Take 1 tablet by mouth 3 (three) times a week.    [provider]  Propylene Glycol (SYSTANE COMPLETE) 0.6 % SOLN Apply 1 drop to eye daily  as needed (dry eyes).     [provider]  sodium chloride (OCEAN) 0.65 % SOLN nasal spray Place 1 spray into both nostrils as needed for congestion. 11/09/14   Domenic Polite, MD  torsemide (DEMADEX) 20 MG tablet Take 1 tablet (20 mg total) by mouth 2 (two) times daily. 03/14/19   Larey Dresser, MD  vitamin E 400 UNIT capsule Take 400 Units by mouth 3 (three) times a week.     [provider]  Olmesartan-Amlodipine-HCTZ (TRIBENZOR) 40-5-12.5 MG TABS Take 1 tablet by mouth daily.    04/12/18  [provider]    Physical Exam: Vitals:   04/15/19 2030 04/15/19 2313  BP: (!) 188/84   Pulse: 88   Resp: 16   Temp: (!) 100.4 F (38 C) 98.6 F (37 C)  TempSrc: Oral Oral  SpO2: 96%       Constitutional: Morbidly obese, comfortable, slightly confused Vitals:   04/15/19 2030 04/15/19 2313  BP: (!) 188/84   Pulse: 88   Resp: 16   Temp: (!) 100.4 F (38 C) 98.6 F (37 C)  TempSrc: Oral Oral  SpO2: 96%    Eyes: PERRL, lids and conjunctivae normal ENMT: Mucous membranes are dry. Posterior pharynx clear of any exudate or lesions.Normal dentition.  Neck: normal, supple, no masses, no thyromegaly Respiratory: clear to auscultation bilaterally, no wheezing, no crackles. Normal respiratory effort. No accessory muscle use.  Cardiovascular: Irregular no murmurs / rubs / gallops. No extremity edema. 2+ pedal pulses. No carotid bruits.  Abdomen: no tenderness, no masses palpated. No hepatosplenomegaly. Bowel sounds positive.  Musculoskeletal: no clubbing / cyanosis. No joint deformity upper and lower extremities. Good ROM, no contractures. Normal muscle tone.  Skin: no rashes, lesions, ulcers. No  induration Neurologic: CN 2-12 grossly intact. Sensation intact, DTR normal. Strength 5/5 in all 4.  Psychiatric:   Mild confusion.     Labs on Admission: I have personally reviewed following labs and imaging studies  CBC: Recent Labs  Lab 04/15/19 2053  WBC 8.2  NEUTROABS 6.6  HGB 14.5  HCT 44.7  MCV 86.0  PLT 944   Basic Metabolic Panel: Recent Labs  Lab 04/15/19 2053  NA 137  K 3.7  CL 95*  CO2 27  GLUCOSE 249*  BUN 38*  CREATININE 2.33*  CALCIUM 8.8*   GFR: CrCl cannot be calculated (Unknown ideal weight.). Liver Function Tests: Recent Labs  Lab 04/15/19 2053  AST 19  ALT 18  ALKPHOS 82  BILITOT 0.7  PROT 6.6  ALBUMIN 3.3*   Recent Labs  Lab 04/15/19 2053  LIPASE 44   No results for input(s): AMMONIA in the last 168 hours. Coagulation Profile: No results for input(s): INR, PROTIME in the last 168 hours. Cardiac Enzymes: No results for input(s): CKTOTAL, CKMB, CKMBINDEX, TROPONINI in the last 168 hours. BNP (last 3 results) No results for input(s): PROBNP in the last 8760 hours. HbA1C: No results for input(s): HGBA1C in the last 72 hours. CBG: Recent Labs  Lab 04/15/19 2216  GLUCAP 270*   Lipid Profile: No results for input(s): CHOL, HDL, LDLCALC, TRIG, CHOLHDL, LDLDIRECT in the last 72 hours. Thyroid Function Tests: No results for input(s): TSH, T4TOTAL, FREET4, T3FREE, THYROIDAB in the last 72 hours. Anemia Panel: No results for input(s): VITAMINB12, FOLATE, FERRITIN, TIBC, IRON, RETICCTPCT in the last 72 hours. Urine analysis:    Component Value Date/Time   COLORURINE YELLOW 04/15/2019 2310   APPEARANCEUR CLEAR 04/15/2019 2310   APPEARANCEUR Clear  06/19/2018 1637   LABSPEC 1.012 04/15/2019 2310   PHURINE 7.0 04/15/2019 2310   GLUCOSEU >=500 (A) 04/15/2019 2310   HGBUR NEGATIVE 04/15/2019 2310   BILIRUBINUR NEGATIVE 04/15/2019 2310   BILIRUBINUR Negative 06/19/2018 1637   KETONESUR NEGATIVE 04/15/2019 2310   PROTEINUR 30 (A)  04/15/2019 2310   UROBILINOGEN 1.0 11/07/2014 2242   NITRITE NEGATIVE 04/15/2019 2310   LEUKOCYTESUR NEGATIVE 04/15/2019 2310   Sepsis Labs: @LABRCNTIP (procalcitonin:4,lacticidven:4) )No results found for this or any previous visit (from the past 240 hour(s)).   Radiological Exams on Admission: Ct Abdomen Pelvis Wo Contrast  Result Date: 04/15/2019 CLINICAL DATA:  Abdominal pain, fever, suspected abscess, nausea vomiting. EXAM: CT ABDOMEN AND PELVIS WITHOUT CONTRAST TECHNIQUE: Multidetector CT imaging of the abdomen and pelvis was performed following the standard protocol without IV contrast. COMPARISON:  CT renal colic Jan 04, 9416 FINDINGS: Lower chest: Bandlike areas of opacity in the lung bases may reflect a combination of atelectasis and/or scarring. There is prominent pericardial fat. There is a small pericardial effusion which is similar to comparison CT. Cardiac size is at the upper limits of normal. Transcatheter aortic valve replacement is noted. Dense mitral annular calcifications are seen. Atherosclerotic calcification of the coronary arteries. Hepatobiliary: No focal liver abnormality is seen. Patient is post cholecystectomy. Slight prominence of the biliary tree likely related to reservoir effect. No calcified intraductal gallstones. Pancreas: Unremarkable. No pancreatic ductal dilatation or surrounding inflammatory changes. Spleen: Normal in size without focal abnormality. Small accessory splenule. Adrenals/Urinary Tract: Normal adrenal glands. The kidneys appear diminutive in size and atrophic. There is focal Peri in a lymph a may shin in the upper pole of the left kidney though this is overall similar to comparison CT dated Jan 03, 2017. No visible renal lesions. No urolithiasis or hydronephrosis. Normal appearance of the bladder. Stomach/Bowel: Distal esophagus is unremarkable there are postsurgical changes from prior gastric sleeve. Duodenal sweep is normal course without focal  abnormality. No small bowel dilatation or wall thickening. A normal appendix is visualized. Scattered colonic diverticula without focal pericolonic inflammation to suggest diverticulitis. No abnormal colonic thickening or dilatation. Vascular/Lymphatic: Extensive atheromatous plaque throughout the aorta and branch vessels. No suspicious or enlarged lymph nodes in the included lymphatic chains. Reproductive: Normal appearance of the uterus and adnexal structures. Other: No abdominopelvic free fluid or free gas. No bowel containing hernias. Musculoskeletal: There is marked laxity of the anterior abdominal wall which is more pronounced than on comparison priors with atrophy of the rectus muscle sheath. No acute osseous abnormality or suspicious osseous lesion. 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 Multilevel degenerative changes are present in the imaged portions of the spine. Additional degenerative features in the pelvis and hips. IMPRESSION: 1. No acute intra-abdominal process. Specifically, no evidence of abdominopelvic abscess or infection. 2. Marked laxity of the anterior abdominal wall, more pronounced than on comparison priors. No bowel containing hernias. 3. Prior transcatheter aortic valve replacement. 4. Atrophic appearance of the kidneys, correlate with renal function. 5. Stable pericardial effusion. 6. Prior gastric sleeve. 7. Aortic Atherosclerosis (ICD10-I70.0). Electronically Signed   By: Lovena Le M.D.   On: 04/15/2019 23:02   Ct Head Wo Contrast  Result Date: 04/15/2019 CLINICAL DATA:  Altered level of consciousness, nausea EXAM: CT HEAD WITHOUT CONTRAST TECHNIQUE: Contiguous axial images were obtained from the base of the skull through the vertex without intravenous contrast. COMPARISON:  MR, CT 06/16/2018 FINDINGS: Brain: No evidence of acute infarction, hemorrhage, hydrocephalus, extra-axial  collection or mass  lesion/mass effect. Symmetric prominence of the ventricles, cisterns and sulci compatible with parenchymal volume loss. Patchy areas of white matter hypoattenuation are most compatible with chronic microvascular angiopathy. Senescent mineralization of the basal ganglia bilaterally. Benign dural calcifications. Vascular: Atherosclerotic calcification of the carotid siphons and intradural vertebral arteries. No hyperdense vessel. Skull: Hyperostosis frontalis interna, a benign incidental finding. No calvarial fracture or suspicious osseous lesion. No scalp swelling or hematoma. Sinuses/Orbits: Paranasal sinuses and mastoid air cells are predominantly clear. Orbital structures are unremarkable aside from prior lens extractions. Other: None. IMPRESSION: No acute intracranial abnormality. Chronic microvascular angiopathy, volume loss and intracranial atherosclerosis. Electronically Signed   By: Lovena Le M.D.   On: 04/15/2019 23:05   Dg Chest Port 1 View  Result Date: 04/15/2019 CLINICAL DATA:  Fever EXAM: PORTABLE CHEST 1 VIEW COMPARISON:  September 05, 2018 FINDINGS: The patient is status post prior TAVR. The heart size remains enlarged. Aortic calcifications are noted. There is no focal infiltrate. The previously noted left-sided airspace opacity has improved. There is no significant pleural effusion. No significant pneumothorax. There may be some mild volume overload without overt pulmonary edema. IMPRESSION: No active disease. Electronically Signed   By: Constance Holster M.D.   On: 04/15/2019 21:22   Dg Foot Complete Right  Result Date: 04/15/2019 CLINICAL DATA:  He will pressure. EXAM: RIGHT FOOT COMPLETE - 3+ VIEW COMPARISON:  None. FINDINGS: There is no acute displaced fracture or dislocation. A moderate-sized plantar calcaneal spur is noted. Advanced vascular calcifications are noted. There is no radiopaque foreign body. No significant surrounding soft tissue swelling. IMPRESSION: No acute osseous  abnormality. Electronically Signed   By: Constance Holster M.D.   On: 04/15/2019 21:23    EKG: Independently reviewed.  EKG shows sinus rhythm with a rate of 88, flipped T waves in the lateral leads but unchanged from July.  Assessment/Plan Principal Problem:   Weakness generalized Active Problems:   Coronary artery disease, non-occlusive   CKD stage 3 due to type 2 diabetes mellitus (HCC)   Type II diabetes mellitus with peripheral circulatory disorder (HCC)   History of gastric bypass   Morbid obesity (HCC)   Hypertension   Atrial fibrillation (HCC)   S/P TAVR (transcatheter aortic valve replacement)   SIRS (systemic inflammatory response syndrome) ([place])     #1 generalized weakness: No significant cause identified.  Probably combination of many factors.  She is now more awake and alert and communicating.  She is got uncontrolled diabetes.  Patient will be admitted.  PT OT consultation.  Cycle enzymes with her history of coronary artery disease.  Low-grade temperature.  She is COVID-19 negative.  Repeat UA and possible follow-up blood cultures.  #2 diabetes: Sliding scale insulin with home regimen.  Check hemoglobin A1c  #3 morbid obesity: Dietary counseling.  #4 atrial fibrillation: Rate is controlled.  Patient is on anticoagulation.  Head CT showed no acute bleed.    #5 history of gastric bypass: No obvious complications at this point.  Stable at best.  #6 coronary artery disease: No chest pain.  No evidence of decompensation.   DVT prophylaxis: Eliquis Code Status: Full code Family Communication: Husband over the phone Disposition Plan: To be determined Consults called: None Admission status: Observation  Severity of Illness: The appropriate patient status for this patient is OBSERVATION. Observation status is judged to be reasonable and necessary in order to provide the required intensity of service to ensure the patient's safety. The patient's presenting symptoms,  physical exam findings, and initial radiographic and laboratory data in the context of their medical condition is felt to place them at decreased risk for further clinical deterioration. Furthermore, it is anticipated that the patient will be medically stable for discharge from the hospital within 2 midnights of admission. The following factors support the patient status of observation.   " The patient's presenting symptoms include generalized weakness. " The physical exam findings include morbidly obese but no acute findings. " The initial radiographic and laboratory data are mostly chronic findings.     Barbette Merino MD [facility] Pager [phone]  If 7PM-7AM, please contact night-coverage [email or web address] Password [facility]  04/15/2019, 11:53 PM

## 2019-04-16 ENCOUNTER — Other Ambulatory Visit: Payer: Self-pay

## 2019-04-16 ENCOUNTER — Encounter (HOSPITAL_COMMUNITY): Payer: Self-pay

## 2019-04-16 DIAGNOSIS — I350 Nonrheumatic aortic (valve) stenosis: Secondary | ICD-10-CM | POA: Diagnosis not present

## 2019-04-16 DIAGNOSIS — I129 Hypertensive chronic kidney disease with stage 1 through stage 4 chronic kidney disease, or unspecified chronic kidney disease: Secondary | ICD-10-CM | POA: Diagnosis not present

## 2019-04-16 DIAGNOSIS — E11622 Type 2 diabetes mellitus with other skin ulcer: Secondary | ICD-10-CM | POA: Diagnosis not present

## 2019-04-16 DIAGNOSIS — W010XXA Fall on same level from slipping, tripping and stumbling without subsequent striking against object, initial encounter: Secondary | ICD-10-CM | POA: Diagnosis present

## 2019-04-16 DIAGNOSIS — L089 Local infection of the skin and subcutaneous tissue, unspecified: Secondary | ICD-10-CM | POA: Diagnosis present

## 2019-04-16 DIAGNOSIS — Z9884 Bariatric surgery status: Secondary | ICD-10-CM | POA: Diagnosis not present

## 2019-04-16 DIAGNOSIS — I878 Other specified disorders of veins: Secondary | ICD-10-CM | POA: Diagnosis not present

## 2019-04-16 DIAGNOSIS — K219 Gastro-esophageal reflux disease without esophagitis: Secondary | ICD-10-CM | POA: Diagnosis present

## 2019-04-16 DIAGNOSIS — Z6832 Body mass index (BMI) 32.0-32.9, adult: Secondary | ICD-10-CM | POA: Diagnosis not present

## 2019-04-16 DIAGNOSIS — L97419 Non-pressure chronic ulcer of right heel and midfoot with unspecified severity: Secondary | ICD-10-CM | POA: Diagnosis not present

## 2019-04-16 DIAGNOSIS — Z87442 Personal history of urinary calculi: Secondary | ICD-10-CM | POA: Diagnosis not present

## 2019-04-16 DIAGNOSIS — L89510 Pressure ulcer of right ankle, unstageable: Secondary | ICD-10-CM | POA: Diagnosis not present

## 2019-04-16 DIAGNOSIS — Z452 Encounter for adjustment and management of vascular access device: Secondary | ICD-10-CM | POA: Diagnosis not present

## 2019-04-16 DIAGNOSIS — E11628 Type 2 diabetes mellitus with other skin complications: Secondary | ICD-10-CM | POA: Diagnosis not present

## 2019-04-16 DIAGNOSIS — R509 Fever, unspecified: Secondary | ICD-10-CM | POA: Diagnosis not present

## 2019-04-16 DIAGNOSIS — Z952 Presence of prosthetic heart valve: Secondary | ICD-10-CM | POA: Diagnosis not present

## 2019-04-16 DIAGNOSIS — E876 Hypokalemia: Secondary | ICD-10-CM | POA: Diagnosis not present

## 2019-04-16 DIAGNOSIS — I351 Nonrheumatic aortic (valve) insufficiency: Secondary | ICD-10-CM | POA: Diagnosis not present

## 2019-04-16 DIAGNOSIS — Z880 Allergy status to penicillin: Secondary | ICD-10-CM | POA: Diagnosis not present

## 2019-04-16 DIAGNOSIS — L8990 Pressure ulcer of unspecified site, unspecified stage: Secondary | ICD-10-CM

## 2019-04-16 DIAGNOSIS — K589 Irritable bowel syndrome without diarrhea: Secondary | ICD-10-CM | POA: Diagnosis present

## 2019-04-16 DIAGNOSIS — N179 Acute kidney failure, unspecified: Secondary | ICD-10-CM | POA: Diagnosis not present

## 2019-04-16 DIAGNOSIS — L89610 Pressure ulcer of right heel, unstageable: Secondary | ICD-10-CM | POA: Diagnosis not present

## 2019-04-16 DIAGNOSIS — N183 Chronic kidney disease, stage 3 (moderate): Secondary | ICD-10-CM | POA: Diagnosis not present

## 2019-04-16 DIAGNOSIS — R011 Cardiac murmur, unspecified: Secondary | ICD-10-CM | POA: Diagnosis not present

## 2019-04-16 DIAGNOSIS — E1151 Type 2 diabetes mellitus with diabetic peripheral angiopathy without gangrene: Secondary | ICD-10-CM | POA: Diagnosis not present

## 2019-04-16 DIAGNOSIS — N184 Chronic kidney disease, stage 4 (severe): Secondary | ICD-10-CM | POA: Diagnosis not present

## 2019-04-16 DIAGNOSIS — I130 Hypertensive heart and chronic kidney disease with heart failure and stage 1 through stage 4 chronic kidney disease, or unspecified chronic kidney disease: Secondary | ICD-10-CM | POA: Diagnosis not present

## 2019-04-16 DIAGNOSIS — E11621 Type 2 diabetes mellitus with foot ulcer: Secondary | ICD-10-CM | POA: Diagnosis not present

## 2019-04-16 DIAGNOSIS — E1165 Type 2 diabetes mellitus with hyperglycemia: Secondary | ICD-10-CM | POA: Diagnosis present

## 2019-04-16 DIAGNOSIS — Z20828 Contact with and (suspected) exposure to other viral communicable diseases: Secondary | ICD-10-CM | POA: Diagnosis not present

## 2019-04-16 DIAGNOSIS — G9341 Metabolic encephalopathy: Secondary | ICD-10-CM | POA: Diagnosis not present

## 2019-04-16 DIAGNOSIS — B952 Enterococcus as the cause of diseases classified elsewhere: Secondary | ICD-10-CM | POA: Diagnosis not present

## 2019-04-16 DIAGNOSIS — I330 Acute and subacute infective endocarditis: Secondary | ICD-10-CM | POA: Diagnosis not present

## 2019-04-16 DIAGNOSIS — I5032 Chronic diastolic (congestive) heart failure: Secondary | ICD-10-CM | POA: Diagnosis not present

## 2019-04-16 DIAGNOSIS — M810 Age-related osteoporosis without current pathological fracture: Secondary | ICD-10-CM | POA: Diagnosis present

## 2019-04-16 DIAGNOSIS — R7881 Bacteremia: Secondary | ICD-10-CM | POA: Diagnosis not present

## 2019-04-16 DIAGNOSIS — E785 Hyperlipidemia, unspecified: Secondary | ICD-10-CM | POA: Diagnosis present

## 2019-04-16 DIAGNOSIS — I080 Rheumatic disorders of both mitral and aortic valves: Secondary | ICD-10-CM | POA: Diagnosis not present

## 2019-04-16 DIAGNOSIS — I480 Paroxysmal atrial fibrillation: Secondary | ICD-10-CM | POA: Diagnosis present

## 2019-04-16 DIAGNOSIS — I482 Chronic atrial fibrillation, unspecified: Secondary | ICD-10-CM | POA: Diagnosis not present

## 2019-04-16 DIAGNOSIS — E1122 Type 2 diabetes mellitus with diabetic chronic kidney disease: Secondary | ICD-10-CM | POA: Diagnosis not present

## 2019-04-16 DIAGNOSIS — R32 Unspecified urinary incontinence: Secondary | ICD-10-CM | POA: Diagnosis not present

## 2019-04-16 DIAGNOSIS — A4181 Sepsis due to Enterococcus: Secondary | ICD-10-CM | POA: Diagnosis not present

## 2019-04-16 DIAGNOSIS — R531 Weakness: Secondary | ICD-10-CM | POA: Diagnosis not present

## 2019-04-16 HISTORY — DX: Pressure ulcer of unspecified site, unspecified stage: L89.90

## 2019-04-16 LAB — COMPREHENSIVE METABOLIC PANEL
ALT: 17 U/L (ref 0–44)
AST: 19 U/L (ref 15–41)
Albumin: 3 g/dL — ABNORMAL LOW (ref 3.5–5.0)
Alkaline Phosphatase: 74 U/L (ref 38–126)
Anion gap: 12 (ref 5–15)
BUN: 37 mg/dL — ABNORMAL HIGH (ref 8–23)
CO2: 24 mmol/L (ref 22–32)
Calcium: 8.5 mg/dL — ABNORMAL LOW (ref 8.9–10.3)
Chloride: 105 mmol/L (ref 98–111)
Creatinine, Ser: 2.2 mg/dL — ABNORMAL HIGH (ref 0.44–1.00)
GFR calc Af Amer: 26 mL/min — ABNORMAL LOW (ref >60)
GFR calc non Af Amer: 22 mL/min — ABNORMAL LOW (ref >60)
Glucose, Bld: 315 mg/dL — ABNORMAL HIGH (ref 70–99)
Potassium: 4.1 mmol/L (ref 3.5–5.1)
Sodium: 141 mmol/L (ref 135–145)
Total Bilirubin: 0.9 mg/dL (ref 0.3–1.2)
Total Protein: 6.1 g/dL — ABNORMAL LOW (ref 6.5–8.1)

## 2019-04-16 LAB — CBC
HCT: 41.6 % (ref 36.0–46.0)
Hemoglobin: 13.5 g/dL (ref 12.0–15.0)
MCH: 27.6 pg (ref 26.0–34.0)
MCHC: 32.5 g/dL (ref 30.0–36.0)
MCV: 84.9 fL (ref 80.0–100.0)
Platelets: 188 10*3/uL (ref 150–400)
RBC: 4.9 MIL/uL (ref 3.87–5.11)
RDW: 13.3 % (ref 11.5–15.5)
WBC: 9.9 10*3/uL (ref 4.0–10.5)
nRBC: 0 % (ref 0.0–0.2)

## 2019-04-16 LAB — C-REACTIVE PROTEIN: CRP: 10.2 mg/dL — ABNORMAL HIGH (ref <1.0)

## 2019-04-16 LAB — GLUCOSE, CAPILLARY
Glucose-Capillary: 154 mg/dL — ABNORMAL HIGH (ref 70–99)
Glucose-Capillary: 237 mg/dL — ABNORMAL HIGH (ref 70–99)
Glucose-Capillary: 258 mg/dL — ABNORMAL HIGH (ref 70–99)
Glucose-Capillary: 301 mg/dL — ABNORMAL HIGH (ref 70–99)
Glucose-Capillary: 348 mg/dL — ABNORMAL HIGH (ref 70–99)

## 2019-04-16 LAB — HEMOGLOBIN A1C
Hgb A1c MFr Bld: 11 % — ABNORMAL HIGH (ref 4.8–5.6)
Mean Plasma Glucose: 269 mg/dL

## 2019-04-16 LAB — LACTIC ACID, PLASMA: Lactic Acid, Venous: 1.3 mmol/L (ref 0.5–1.9)

## 2019-04-16 LAB — HIV ANTIBODY (ROUTINE TESTING W REFLEX): HIV Screen 4th Generation wRfx: NONREACTIVE

## 2019-04-16 LAB — CBG MONITORING, ED: Glucose-Capillary: 294 mg/dL — ABNORMAL HIGH (ref 70–99)

## 2019-04-16 LAB — SARS CORONAVIRUS 2 BY RT PCR (HOSPITAL ORDER, PERFORMED IN ~~LOC~~ HOSPITAL LAB): SARS Coronavirus 2: NEGATIVE

## 2019-04-16 LAB — SEDIMENTATION RATE: Sed Rate: 17 mm/hr (ref 0–22)

## 2019-04-16 LAB — PREALBUMIN: Prealbumin: 15.8 mg/dL — ABNORMAL LOW (ref 18–38)

## 2019-04-16 MED ORDER — VANCOMYCIN HCL 10 G IV SOLR: 1250.0000 mg | INTRAVENOUS | Status: DC

## 2019-04-16 MED ORDER — MECLIZINE HCL 12.5 MG PO TABS
12.5000 mg | ORAL_TABLET | Freq: Three times a day (TID) | ORAL | Status: DC | PRN
  Filled 2019-04-16: qty 1

## 2019-04-16 MED ORDER — INSULIN ASPART 100 UNIT/ML ~~LOC~~ SOLN
0.0000 [IU] | Freq: Every day | SUBCUTANEOUS | Status: DC
  Administered 2019-04-16 – 2019-04-17 (×3): 3 [IU] via SUBCUTANEOUS

## 2019-04-16 MED ORDER — ONDANSETRON 4 MG PO TBDP: 4.0000 mg | ORAL_TABLET | Freq: Three times a day (TID) | ORAL | Status: DC | PRN

## 2019-04-16 MED ORDER — SODIUM CHLORIDE 0.9 % IV SOLN
2.0000 g | INTRAVENOUS | Status: DC
  Administered 2019-04-16 – 2019-04-17 (×2): 2 g via INTRAVENOUS
  Filled 2019-04-16 (×2): qty 2

## 2019-04-16 MED ORDER — INSULIN ASPART 100 UNIT/ML ~~LOC~~ SOLN
0.0000 [IU] | Freq: Three times a day (TID) | SUBCUTANEOUS | Status: DC
  Administered 2019-04-16: 5 [IU] via SUBCUTANEOUS
  Administered 2019-04-16 (×2): 11 [IU] via SUBCUTANEOUS
  Administered 2019-04-17: 8 [IU] via SUBCUTANEOUS
  Administered 2019-04-17: 2 [IU] via SUBCUTANEOUS
  Administered 2019-04-17: 5 [IU] via SUBCUTANEOUS
  Administered 2019-04-18: 15 [IU] via SUBCUTANEOUS
  Administered 2019-04-18: 3 [IU] via SUBCUTANEOUS

## 2019-04-16 MED ORDER — SALINE SPRAY 0.65 % NA SOLN
1.0000 | NASAL | Status: DC | PRN
  Filled 2019-04-16: qty 44

## 2019-04-16 MED ORDER — VITAMIN D 25 MCG (1000 UNIT) PO TABS
2000.0000 [IU] | ORAL_TABLET | Freq: Every day | ORAL | Status: DC
  Administered 2019-04-16 – 2019-04-26 (×11): 2000 [IU] via ORAL
  Filled 2019-04-16 (×11): qty 2

## 2019-04-16 MED ORDER — HYDRALAZINE HCL 10 MG PO TABS
10.0000 mg | ORAL_TABLET | Freq: Four times a day (QID) | ORAL | Status: DC
  Administered 2019-04-16 – 2019-04-26 (×33): 10 mg via ORAL
  Filled 2019-04-16 (×40): qty 1

## 2019-04-16 MED ORDER — PRAVASTATIN SODIUM 40 MG PO TABS
40.0000 mg | ORAL_TABLET | Freq: Every day | ORAL | Status: DC
  Administered 2019-04-16 – 2019-04-25 (×10): 40 mg via ORAL
  Filled 2019-04-16 (×10): qty 1

## 2019-04-16 MED ORDER — ONDANSETRON HCL 4 MG/2ML IJ SOLN: 4.0000 mg | Freq: Four times a day (QID) | INTRAMUSCULAR | Status: DC | PRN

## 2019-04-16 MED ORDER — VITAMIN E 180 MG (400 UNIT) PO CAPS
400.0000 [IU] | ORAL_CAPSULE | ORAL | Status: DC
  Administered 2019-04-17 – 2019-04-26 (×5): 400 [IU] via ORAL
  Filled 2019-04-16 (×5): qty 1

## 2019-04-16 MED ORDER — ONDANSETRON HCL 4 MG PO TABS
4.0000 mg | ORAL_TABLET | Freq: Four times a day (QID) | ORAL | Status: DC | PRN
  Administered 2019-04-26: 4 mg via ORAL
  Filled 2019-04-16: qty 1

## 2019-04-16 MED ORDER — PRENATAL PLUS 27-1 MG PO TABS
1.0000 | ORAL_TABLET | ORAL | Status: DC
  Administered 2019-04-17 – 2019-04-26 (×5): 1 via ORAL
  Filled 2019-04-16 (×5): qty 1

## 2019-04-16 MED ORDER — SODIUM CHLORIDE 0.9 % IV SOLN
INTRAVENOUS | Status: DC
  Administered 2019-04-16: via INTRAVENOUS

## 2019-04-16 MED ORDER — MUPIROCIN 2 % EX OINT
1.0000 "application " | TOPICAL_OINTMENT | Freq: Two times a day (BID) | CUTANEOUS | Status: DC
  Administered 2019-04-16 – 2019-04-26 (×21): 1 via TOPICAL
  Filled 2019-04-16 (×2): qty 22

## 2019-04-16 MED ORDER — INSULIN ASPART PROT & ASPART (70-30 MIX) 100 UNIT/ML ~~LOC~~ SUSP
75.0000 [IU] | Freq: Two times a day (BID) | SUBCUTANEOUS | Status: DC
  Administered 2019-04-16 – 2019-04-18 (×5): 75 [IU] via SUBCUTANEOUS
  Filled 2019-04-16: qty 10

## 2019-04-16 MED ORDER — TORSEMIDE 20 MG PO TABS
20.0000 mg | ORAL_TABLET | Freq: Two times a day (BID) | ORAL | Status: DC
  Administered 2019-04-16 – 2019-04-23 (×15): 20 mg via ORAL
  Filled 2019-04-16 (×16): qty 1

## 2019-04-16 MED ORDER — "INSULIN SYRINGE-NEEDLE U-100 31G X 5/16"" 1 ML MISC": 1.0000 | Freq: Three times a day (TID) | Status: DC

## 2019-04-16 MED ORDER — CARVEDILOL 25 MG PO TABS
25.0000 mg | ORAL_TABLET | Freq: Two times a day (BID) | ORAL | Status: DC
  Administered 2019-04-16 – 2019-04-26 (×21): 25 mg via ORAL
  Filled 2019-04-16 (×21): qty 1

## 2019-04-16 MED ORDER — SODIUM CHLORIDE 0.9 % IV SOLN
2.0000 g | INTRAVENOUS | Status: DC
  Filled 2019-04-16: qty 2

## 2019-04-16 MED ORDER — CALCIUM CARBONATE-VITAMIN D 500-200 MG-UNIT PO TABS
1.0000 | ORAL_TABLET | ORAL | Status: DC
  Administered 2019-04-17 – 2019-04-26 (×4): 1 via ORAL
  Filled 2019-04-16 (×4): qty 1

## 2019-04-16 MED ORDER — FENOFIBRATE 54 MG PO TABS
54.0000 mg | ORAL_TABLET | Freq: Every day | ORAL | Status: DC
  Administered 2019-04-16 – 2019-04-26 (×11): 54 mg via ORAL
  Filled 2019-04-16 (×11): qty 1

## 2019-04-16 MED ORDER — METRONIDAZOLE IN NACL 5-0.79 MG/ML-% IV SOLN
500.0000 mg | Freq: Three times a day (TID) | INTRAVENOUS | Status: DC
  Administered 2019-04-16 – 2019-04-17 (×5): 500 mg via INTRAVENOUS
  Filled 2019-04-16 (×5): qty 100

## 2019-04-16 MED ORDER — POLYVINYL ALCOHOL 1.4 % OP SOLN
1.0000 [drp] | Freq: Every day | OPHTHALMIC | Status: DC | PRN
  Filled 2019-04-16: qty 15

## 2019-04-16 MED ORDER — CIPROFLOXACIN HCL 500 MG PO TABS: 500.0000 mg | ORAL_TABLET | Freq: Two times a day (BID) | ORAL | Status: DC

## 2019-04-16 MED ORDER — APIXABAN 5 MG PO TABS
5.0000 mg | ORAL_TABLET | Freq: Two times a day (BID) | ORAL | Status: DC
  Administered 2019-04-16 – 2019-04-26 (×21): 5 mg via ORAL
  Filled 2019-04-16 (×22): qty 1

## 2019-04-16 MED ORDER — VANCOMYCIN HCL IN DEXTROSE 1-5 GM/200ML-% IV SOLN: 1000.0000 mg | INTRAVENOUS | Status: DC

## 2019-04-16 NOTE — Consult Note (Signed)
[facility] [place] Nurse wound consult note Patient receiving care in [facility] [phone]. Consult completed remotely after review of record, including images. Reason for Consult: foot wound Wound type: not obvious at this time Pressure Injury POA: Yes Measurement: To be provided by the bedside RN in the flowsheet section Wound bed: pink with yellow biofilm Drainage (amount, consistency, odor) none noted Periwound: intact Dressing procedure/placement/frequency: Cleanse foot wounds with saline, pat dry. Apply hydrocolloid dressings over all wounds.  You can cut these dressings to fit the areas if needed.  Kellie Simmering [phone]. Monitor the wound area(s) for worsening of condition such as: Signs/symptoms of infection,  Increase in size,  Development of or worsening of odor, Development of pain, or increased pain at the affected locations.  Notify the medical team if any of these develop.  Thank you for the consult. [place] nurse will not follow at this time.  Please re-consult the [place] team if needed.  Val Riles, RN, MSN, CWOCN, CNS-BC, pager [phone]

## 2019-04-16 NOTE — Progress Notes (Addendum)
Inpatient Diabetes Program Recommendations  AACE/ADA: New Consensus Statement on Inpatient Glycemic Control (2015)  Target Ranges:  Prepandial:   less than 140 mg/dL      Peak postprandial:   less than 180 mg/dL (1-2 hours)      Critically ill patients:  140 - 180 mg/dL   Lab Results  Component Value Date   GLUCAP 348 (H) 04/16/2019   HGBA1C 11.0 (H) 04/16/2019    Review of Glycemic Control Results for [name], [name] (MRN [id number]) as of 04/16/2019 14:41  Ref. Range 04/16/2019 08:07 04/16/2019 12:57  Glucose-Capillary Latest Ref Range: 70 - 99 mg/dL 301 (H) 348 (H)   Diabetes history: Type 2 DM Outpatient Diabetes medications: Novolog 70/30 75 (B)/60 (D) /60 (QHS)  Current orders for Inpatient glycemic control: Novolog 70/30 75 units BID, Novolog 0-15 units TID, Novolog 0-5 units QHS  Inpatient Diabetes Program Recommendations:    Recommend increasing Novolog 70/30 80 units BID   Spoke with patient regarding outpatient diabetes management. Verified home dosages and denies missing doses. Patient is followed by Dr Chalmers Cater, endocrinology.  Reviewed patient's current A1c of 11%. Explained what a A1c is and what it measures. Also reviewed goal A1c with patient, importance of good glucose control @ home, and blood sugar goals. Reviewed patho of DM, need for insulin, role of pancreas, vascular changes and comorbidites. Patient has a meter and uses [employer]. States blood sugars have been consistently >300's mg/dL. Patient reports calling Dr Almetta Lovely office and was instructed to take an additional 5 units of Novolog 70/30 (equates to 3.5 units of NPH/ 1.5 units of Regular). Patient did not see results. Patient is to follow back up with Dr Chalmers Cater in the next month.  During conversation, patient denies drinking sugary beverages and reports eating mostly protein. However, following meals is when patient would notice CBGs exceeding 400's mg/dL. Reviewed with patient when to call MD regarding  hyperglycemia, stressed the importance of close follow up with Dr Chalmers Cater and discussed 70/30 vs basal/bolus. Patient has no further questions at this time.   Thanks, Bronson Curb, MSN, RNC-OB Diabetes Coordinator [phone] (8a-5p)

## 2019-04-16 NOTE — Progress Notes (Signed)
Pharmacy Antibiotic Note  [name] is a 68 y.o. female admitted on 04/15/2019 with weakness, possible sepsis.  Pharmacy has been consulted for Vancomycin and Cefepime dosing.  Vancomycin 1500 mg IV given in ED at  0100  Plan: Vancomycin 1250 mg IV q48h Cefepime 2 g IV q24h   Height: 5\' 7"  (170.2 cm) Weight: 206 lb 2.1 oz (93.5 kg) IBW/kg (Calculated) : 61.6  Temp (24hrs), Avg:99.1 F (37.3 C), Min:98.5 F (36.9 C), Max:100.4 F (38 C)  Recent Labs  Lab 04/15/19 2049 04/15/19 2053 04/15/19 2351 04/16/19 0554  WBC  --  8.2  --  9.9  CREATININE  --  2.33*  --  2.20*  LATICACIDVEN 1.7  --  1.3  --     Estimated Creatinine Clearance: 28.7 mL/min (A) (by C-G formula based on SCr of 2.2 mg/dL (H)).    Allergies  Allergen Reactions  . Amlodipine Swelling    Ankle swelling  . Atorvastatin Other (See Comments)    Myalgias   . Crestor [Rosuvastatin Calcium] Other (See Comments)    Stiffness and back pain  . Ezetimibe-Simvastatin Other (See Comments)    Leg cramps  . Statins Other (See Comments)    Myalgias  . Celebrex [Celecoxib] Rash  . Levemir [Insulin Detemir] Rash  . Penicillins Other (See Comments)    SYNCOPE (Tolerated Ancef) Has patient had a PCN reaction causing immediate rash, facial/tongue/throat swelling, SOB or lightheadedness with hypotension: Yes Has patient had a PCN reaction causing severe rash involving mucus membranes or skin necrosis: No Has patient had a PCN reaction that required hospitalization No Has patient had a PCN reaction occurring within the last 10 years: No If all of the above answers are "NO", then may proceed with Cephalosporin use.    [name] 04/16/2019 7:48 AM

## 2019-04-16 NOTE — Evaluation (Signed)
Physical Therapy Evaluation Patient Details Name: [name] MRN: [id number] DOB: 1951-07-05 Today's Date: 04/16/2019   History of Present Illness  Pt is a 68 y/o female admitted secondary to generalized weakness, thought to be secondary to multifactoral cause. PMH includes obesity, CAD, CKD, DM, HTN, a fib, and s/p TAVR.   Clinical Impression  Pt admitted secondary to problem above with deficits below. Pt easily distracted during session and demonstrated slowed processing. Required safety cues and min A throughout transfers using RW. Further mobility limited secondary to pt reports of "wooziness". At this time, feel pt would benefit from short term SNF stay, however, pt may progress well and be able to d/c home with [facility] services. Will continue to follow acutely to maximize functional mobility independence and safety and will update recommendations based on pt progress.     Follow Up Recommendations SNF;Supervision/Assistance - 24 hour(vs HHPT pending progress)    Equipment Recommendations  None recommended by PT    Recommendations for Other Services       Precautions / Restrictions Precautions Precautions: Fall Precaution Comments: Reports "sliding" off of BSC prior to admission secondary to dizziness.  Restrictions Weight Bearing Restrictions: No      Mobility  Bed Mobility Overal bed mobility: Needs Assistance Bed Mobility: Supine to Sit     Supine to sit: Min assist     General bed mobility comments: Min A for LE assist and trunk elevation. Increased time required to come to sitting. Required multimodal cues for sequencing.   Transfers Overall transfer level: Needs assistance Equipment used: Rolling walker (2 wheeled) Transfers: Sit to/from [facility] Sit to Stand: Min assist Stand pivot transfers: Min assist       General transfer comment: Min A for steadying assist to stand and transfer to [facility] and then to recliner. Pt pushing RW too far  forwards and required safety cues throughout transfers. Pt reporting "wooziness" which limited further mobility.   Ambulation/Gait                Stairs            Wheelchair Mobility    Modified Rankin (Stroke Patients Only)       Balance Overall balance assessment: Needs assistance Sitting-balance support: No upper extremity supported;Feet supported Sitting balance-Leahy Scale: Fair Sitting balance - Comments: Increased sway noted when sitting EOB. Required close supervision for safety.    Standing balance support: Bilateral upper extremity supported;During functional activity Standing balance-Leahy Scale: Poor Standing balance comment: Reliant on BUE support                              Pertinent Vitals/Pain Pain Assessment: No/denies pain    Home Living Family/patient expects to be discharged to:: Private residence Living Arrangements: Spouse/significant other Available Help at Discharge: Family;Available 24 hours/day Type of Home: House Home Access: Stairs to enter Entrance Stairs-Rails: None Entrance Stairs-Number of Steps: 1 Home Layout: Multi-level;Laundry or work area in basement;Able to live on main level with bedroom/bathroom Home Equipment: Environmental consultant - 2 wheels;Bedside commode;Tub bench;Wheelchair - manual      Prior Function Level of Independence: Needs assistance   Gait / Transfers Assistance Needed: Pt reports using WC for longer distances outside of home and uses RW for mobility within the home.   ADL's / Homemaking Assistance Needed: Pt reports husband assists with ADL and IADL tasks.         Hand Dominance  Extremity/Trunk Assessment   Upper Extremity Assessment Upper Extremity Assessment: Generalized weakness    Lower Extremity Assessment Lower Extremity Assessment: RLE deficits/detail;Generalized weakness RLE Deficits / Details: R chronic heel sore       Communication   Communication: HOH  Cognition  Arousal/Alertness: Lethargic Behavior During Therapy: Flat affect Overall Cognitive Status: No family/caregiver present to determine baseline cognitive functioning                                 General Comments: Pt falling asleep when asked questions, however, easily awoken. Pt with slowed processing and gets distracted easily.       General Comments      Exercises     Assessment/Plan    PT Assessment Patient needs continued PT services  PT Problem List Decreased strength;Decreased balance;Decreased activity tolerance;Decreased mobility;Decreased knowledge of use of DME;Decreased knowledge of precautions       PT Treatment Interventions DME instruction;Gait training;Functional mobility training;Therapeutic activities;Therapeutic exercise;Balance training;Patient/family education    PT Goals (Current goals can be found in the Care Plan section)  Acute Rehab PT Goals Patient Stated Goal: none stated PT Goal Formulation: With patient Time For Goal Achievement: 04/30/19 Potential to Achieve Goals: Good    Frequency Min 3X/week   Barriers to discharge        Co-evaluation               AM-PAC PT "6 Clicks" Mobility  Outcome Measure Help needed turning from your back to your side while in a flat bed without using bedrails?: A Little Help needed moving from lying on your back to sitting on the side of a flat bed without using bedrails?: A Little Help needed moving to and from a bed to a chair (including a wheelchair)?: A Little Help needed standing up from a chair using your arms (e.g., wheelchair or bedside chair)?: A Little Help needed to walk in hospital room?: A Lot Help needed climbing 3-5 steps with a railing? : A Lot 6 Click Score: 16    End of Session Equipment Utilized During Treatment: Gait belt Activity Tolerance: Treatment limited secondary to medical complications (Comment)("wooziness" ) Patient left: in chair;with call bell/phone within  reach;with chair alarm set Nurse Communication: Mobility status PT Visit Diagnosis: Muscle weakness (generalized) (M62.81);History of falling (Z91.81);Unsteadiness on feet (R26.81)    Time: 1045-1106 PT Time Calculation (min) (ACUTE ONLY): 21 min   Charges:   PT Evaluation $PT Eval Moderate Complexity: 1 Mod          Leighton Ruff, PT, DPT  Acute Rehabilitation Services  Pager: [phone] Office: [phone]   Rudean Hitt 04/16/2019, 11:23 AM

## 2019-04-16 NOTE — Progress Notes (Addendum)
Progress Note    [name]  PJA:[id number] DOB: Mar 17, 1951  DOA: 04/15/2019 PCP: [name], DO    Brief Narrative:     Medical records reviewed and are as summarized below:  [name] is an 68 y.o. female with medical history significant of atrial fibrillation, chronic kidney disease stage III, morbid obesity, GERD with esophageal stricture, hyperlipidemia, hypertension, IBS, osteoporosis who presented with significant weakness which is global.  Patient said she has gradually felt weak unable to move around.  She was on her bedside commode at home when leaned forward on her bed and slipped to the floor.  She could not get up from the floor.   Assessment/Plan:   Principal Problem:   Weakness generalized Active Problems:   Coronary artery disease, non-occlusive   CKD stage 3 due to type 2 diabetes mellitus (HCC)   Type II diabetes mellitus with peripheral circulatory disorder (HCC)   History of gastric bypass   Morbid obesity (HCC)   Hypertension   Atrial fibrillation (HCC)   S/P TAVR (transcatheter aortic valve replacement)   SIRS (systemic inflammatory response syndrome) (HCC)   Pressure injury of skin   Infected wound   generalized weakness with a metabolic encephalopathy: -? Multifactorial- had fever-- ? Infection in right foot? Urine clean, no sign of PNA - awake and alert and communicating but per daughter not at her baseline fluency -blood cultures pending -will narrow abx to cover lower extremity wound infections (high risk as she has poorly controlled DM) -WOC consult (pictures placed under media for their review) -PT/OT consult -as her weakness is generalized doubt CVA  PVD: -follows at [facility] Per chart review: 1. S/P right lower extremity angiogram with right popliteal atherectomy and balloon angioplasty and ATA balloon angioplasty--ABI has increased significantly and toe pressures should be high enough to heal the wound. Recommend  stopping triple antibiotic/soaking in Dial soap and start a betadine wet-to-dry dressing changed daily. Will refer urgently to Dr. Thea Alken office for further recommendations/evaluation. 2. Plan on return in 6 months for repeat studies and a letter. 3. Left toes--start painting with betadine daily as well. ABI has slightly decreased although TP increased. It should be just high enough to heal wounds but they have been present for some time. Will discuss with Dr. Danielle Dess need to perform angiogram on this side as well. -plan is for angiogram of left foot on 04/29/2019  Uncontrolled type 2 diabetes:  -HgbA1c: >11 -resume home meds and consult diabetic coordinator   morbid obesity: Dietary counseling. -s/p gastric sleeve already  atrial fibrillation:  -Rate is controlled.   -on eliquis  AS s/p TAVR -follow with Dr. Aundra Dubin  Chronic diastolic CHF -stop IVF for now  CKD III -monitor closely -avoid nephrotoxins  HLD -recently started on praluent  HTN -adjust medications as able    Family Communication/Anticipated D/C date and plan/Code Status   DVT prophylaxis: eliquis Code Status: Full Code.  Family Communication: daughter (respiratory therapy here) at bedside Disposition Plan:    Medical Consultants:    None.     Subjective:   Still appears confused-- denies pain in legs, does have some pain in her lower back  Objective:    Vitals:   04/16/19 0100 04/16/19 0200 04/16/19 0229 04/16/19 0402  BP: (!) 139/57 131/65 (!) 148/79 (!) 179/59  Pulse: 85 83 92 99  Resp: (!) 23 (!) 22 16 20   Temp:   98.5 F (36.9 C) 98.8 F (37.1 C)  TempSrc:  Oral Oral  SpO2: 93% 93% 95% (!) 86%  Weight:   93.5 kg   Height:   5\' 7"  (1.702 m)     Intake/Output Summary (Last 24 hours) at 04/16/2019 1155 Last data filed at 04/16/2019 0930 Gross per 24 hour  Intake 1144.62 ml  Output --  Net 1144.62 ml   Filed Weights   04/15/19 2353 04/16/19 0229  Weight: 97.1 kg 93.5  kg    Exam: In bed, leaning to the left- chronically ill, older than stated age rrr No increased work of breathing left foot with missing 1st toe and scattered wounds on anterior 2/3rd toes; right foot with open wound on heel (photo under media)\ Pulses difficult to palpate Fidgety-- continually moving legs  Data Reviewed:   I have personally reviewed following labs and imaging studies:  Labs: Labs show the following:   Basic Metabolic Panel: Recent Labs  Lab 04/15/19 2053 04/16/19 0554  NA 137 141  K 3.7 4.1  CL 95* 105  CO2 27 24  GLUCOSE 249* 315*  BUN 38* 37*  CREATININE 2.33* 2.20*  CALCIUM 8.8* 8.5*   GFR Estimated Creatinine Clearance: 28.7 mL/min (A) (by C-G formula based on SCr of 2.2 mg/dL (H)). Liver Function Tests: Recent Labs  Lab 04/15/19 2053 04/16/19 0554  AST 19 19  ALT 18 17  ALKPHOS 82 74  BILITOT 0.7 0.9  PROT 6.6 6.1*  ALBUMIN 3.3* 3.0*   Recent Labs  Lab 04/15/19 2053  LIPASE 44   No results for input(s): AMMONIA in the last 168 hours. Coagulation profile No results for input(s): INR, PROTIME in the last 168 hours.  CBC: Recent Labs  Lab 04/15/19 2053 04/16/19 0554  WBC 8.2 9.9  NEUTROABS 6.6  --   HGB 14.5 13.5  HCT 44.7 41.6  MCV 86.0 84.9  PLT 189 188   Cardiac Enzymes: No results for input(s): CKTOTAL, CKMB, CKMBINDEX, TROPONINI in the last 168 hours. BNP (last 3 results) No results for input(s): PROBNP in the last 8760 hours. CBG: Recent Labs  Lab 04/15/19 2216 04/16/19 0059 04/16/19 0807  GLUCAP 270* 294* 301*   D-Dimer: No results for input(s): DDIMER in the last 72 hours. Hgb A1c: Recent Labs    04/16/19 0054  HGBA1C 11.0*   Lipid Profile: No results for input(s): CHOL, HDL, LDLCALC, TRIG, CHOLHDL, LDLDIRECT in the last 72 hours. Thyroid function studies: No results for input(s): TSH, T4TOTAL, T3FREE, THYROIDAB in the last 72 hours.  Invalid input(s): FREET3 Anemia work up: No results for  input(s): VITAMINB12, FOLATE, FERRITIN, TIBC, IRON, RETICCTPCT in the last 72 hours. Sepsis Labs: Recent Labs  Lab 04/15/19 2049 04/15/19 2053 04/15/19 2351 04/16/19 0554  WBC  --  8.2  --  9.9  LATICACIDVEN 1.7  --  1.3  --     Microbiology Recent Results (from the past 240 hour(s))  Blood Culture (routine x 2)     Status: None (Preliminary result)   Collection Time: 04/15/19  9:07 PM   Specimen: BLOOD  Result Value Ref Range Status   Specimen Description BLOOD RIGHT ANTECUBITAL  Final   Special Requests   Final    BOTTLES DRAWN AEROBIC AND ANAEROBIC Blood Culture results may not be optimal due to an inadequate volume of blood received in culture bottles   Culture   Final    NO GROWTH < 12 HOURS Performed at [facility], [place] [address], [place], [place] [address]    Report Status PENDING  Incomplete  Blood Culture (routine x 2)     Status: None (Preliminary result)   Collection Time: 04/15/19  9:08 PM   Specimen: BLOOD  Result Value Ref Range Status   Specimen Description BLOOD RIGHT WRIST  Final   Special Requests   Final    BOTTLES DRAWN AEROBIC AND ANAEROBIC Blood Culture results may not be optimal due to an inadequate volume of blood received in culture bottles   Culture   Final    NO GROWTH < 12 HOURS Performed at [facility], [place]. [address] [place], [place] [address]    Report Status PENDING  Incomplete  SARS Coronavirus 2 [facility] order, Performed in [facility] hospital lab) Nasopharyngeal Nasopharyngeal Swab     Status: None   Collection Time: 04/15/19 11:52 PM   Specimen: Nasopharyngeal Swab  Result Value Ref Range Status   SARS Coronavirus 2 NEGATIVE NEGATIVE Final    Comment: (NOTE) If result is NEGATIVE SARS-CoV-2 target nucleic acids are NOT DETECTED. The SARS-CoV-2 RNA is generally detectable in upper and lower  respiratory specimens during the acute phase of infection. The lowest  concentration of SARS-CoV-2 viral copies this assay can  detect is 250  copies / mL. A negative result does not preclude SARS-CoV-2 infection  and should not be used as the sole basis for treatment or other  patient management decisions.  A negative result may occur with  improper specimen collection / handling, submission of specimen other  than nasopharyngeal swab, presence of viral mutation(s) within the  areas targeted by this assay, and inadequate number of viral copies  (<250 copies / mL). A negative result must be combined with clinical  observations, patient history, and epidemiological information. If result is POSITIVE SARS-CoV-2 target nucleic acids are DETECTED. The SARS-CoV-2 RNA is generally detectable in upper and lower  respiratory specimens dur ing the acute phase of infection.  Positive  results are indicative of active infection with SARS-CoV-2.  Clinical  correlation with patient history and other diagnostic information is  necessary to determine patient infection status.  Positive results do  not rule out bacterial infection or co-infection with other viruses. If result is PRESUMPTIVE POSTIVE SARS-CoV-2 nucleic acids MAY BE PRESENT.   A presumptive positive result was obtained on the submitted specimen  and confirmed on repeat testing.  While 2019 novel coronavirus  (SARS-CoV-2) nucleic acids may be present in the submitted sample  additional confirmatory testing may be necessary for epidemiological  and / or clinical management purposes  to differentiate between  SARS-CoV-2 and other Sarbecovirus currently known to infect humans.  If clinically indicated additional testing with an alternate test  methodology [phone]) is advised. The SARS-CoV-2 RNA is generally  detectable in upper and lower respiratory sp ecimens during the acute  phase of infection. The expected result is Negative. Fact Sheet for Patients:  StrictlyIdeas.no Fact Sheet for Healthcare  Providers: BankingDealers.co.za This test is not yet approved or cleared by the [place] FDA and has been authorized for detection and/or diagnosis of SARS-CoV-2 by FDA under an Emergency Use Authorization (EUA).  This EUA will remain in effect (meaning this test can be used) for the duration of the COVID-19 declaration under Section 564(b)(1) of the Act, 21 U.S.C. section 360bbb-3(b)(1), unless the authorization is terminated or revoked sooner. Performed at [facility], [place] [address], [place], [place] [address]     Procedures and diagnostic studies:  Ct Abdomen Pelvis Wo Contrast  Result Date: 04/15/2019  CLINICAL DATA:  Abdominal pain, fever, suspected abscess, nausea vomiting. EXAM: CT ABDOMEN AND PELVIS WITHOUT CONTRAST TECHNIQUE: Multidetector CT imaging of the abdomen and pelvis was performed following the standard protocol without IV contrast. COMPARISON:  CT renal colic Jan 03, 7321 FINDINGS: Lower chest: Bandlike areas of opacity in the lung bases may reflect a combination of atelectasis and/or scarring. There is prominent pericardial fat. There is a small pericardial effusion which is similar to comparison CT. Cardiac size is at the upper limits of normal. Transcatheter aortic valve replacement is noted. Dense mitral annular calcifications are seen. Atherosclerotic calcification of the coronary arteries. Hepatobiliary: No focal liver abnormality is seen. Patient is post cholecystectomy. Slight prominence of the biliary tree likely related to reservoir effect. No calcified intraductal gallstones. Pancreas: Unremarkable. No pancreatic ductal dilatation or surrounding inflammatory changes. Spleen: Normal in size without focal abnormality. Small accessory splenule. Adrenals/Urinary Tract: Normal adrenal glands. The kidneys appear diminutive in size and atrophic. There is focal Peri in a lymph a may shin in the upper pole of the left kidney though this is  overall similar to comparison CT dated Jan 03, 2017. No visible renal lesions. No urolithiasis or hydronephrosis. Normal appearance of the bladder. Stomach/Bowel: Distal esophagus is unremarkable there are postsurgical changes from prior gastric sleeve. Duodenal sweep is normal course without focal abnormality. No small bowel dilatation or wall thickening. A normal appendix is visualized. Scattered colonic diverticula without focal pericolonic inflammation to suggest diverticulitis. No abnormal colonic thickening or dilatation. Vascular/Lymphatic: Extensive atheromatous plaque throughout the aorta and branch vessels. No suspicious or enlarged lymph nodes in the included lymphatic chains. Reproductive: Normal appearance of the uterus and adnexal structures. Other: No abdominopelvic free fluid or free gas. No bowel containing hernias. Musculoskeletal: There is marked laxity of the anterior abdominal wall which is more pronounced than on comparison priors with atrophy of the rectus muscle sheath. No acute osseous abnormality or suspicious osseous lesion. 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 Multilevel degenerative changes are present in the imaged portions of the spine. Additional degenerative features in the pelvis and hips. IMPRESSION: 1. No acute intra-abdominal process. Specifically, no evidence of abdominopelvic abscess or infection. 2. Marked laxity of the anterior abdominal wall, more pronounced than on comparison priors. No bowel containing hernias. 3. Prior transcatheter aortic valve replacement. 4. Atrophic appearance of the kidneys, correlate with renal function. 5. Stable pericardial effusion. 6. Prior gastric sleeve. 7. Aortic Atherosclerosis (ICD10-I70.0). Electronically Signed   By: Lovena Le M.D.   On: 04/15/2019 23:02   Ct Head Wo Contrast  Result Date: 04/15/2019 CLINICAL DATA:  Altered level of consciousness, nausea  EXAM: CT HEAD WITHOUT CONTRAST TECHNIQUE: Contiguous axial images were obtained from the base of the skull through the vertex without intravenous contrast. COMPARISON:  MR, CT 06/16/2018 FINDINGS: Brain: No evidence of acute infarction, hemorrhage, hydrocephalus, extra-axial collection or mass lesion/mass effect. Symmetric prominence of the ventricles, cisterns and sulci compatible with parenchymal volume loss. Patchy areas of white matter hypoattenuation are most compatible with chronic microvascular angiopathy. Senescent mineralization of the basal ganglia bilaterally. Benign dural calcifications. Vascular: Atherosclerotic calcification of the carotid siphons and intradural vertebral arteries. No hyperdense vessel. Skull: Hyperostosis frontalis interna, a benign incidental finding. No calvarial fracture or suspicious osseous lesion. No scalp swelling or hematoma. Sinuses/Orbits: Paranasal sinuses and mastoid air cells are predominantly clear. Orbital structures are unremarkable aside from prior lens extractions. Other: None. IMPRESSION: No acute intracranial abnormality. Chronic microvascular angiopathy,  volume loss and intracranial atherosclerosis. Electronically Signed   By: Lovena Le M.D.   On: 04/15/2019 23:05   Dg Chest Port 1 View  Result Date: 04/15/2019 CLINICAL DATA:  Fever EXAM: PORTABLE CHEST 1 VIEW COMPARISON:  September 05, 2018 FINDINGS: The patient is status post prior TAVR. The heart size remains enlarged. Aortic calcifications are noted. There is no focal infiltrate. The previously noted left-sided airspace opacity has improved. There is no significant pleural effusion. No significant pneumothorax. There may be some mild volume overload without overt pulmonary edema. IMPRESSION: No active disease. Electronically Signed   By: Constance Holster M.D.   On: 04/15/2019 21:22   Dg Foot Complete Right  Result Date: 04/15/2019 CLINICAL DATA:  He will pressure. EXAM: RIGHT FOOT COMPLETE - 3+ VIEW  COMPARISON:  None. FINDINGS: There is no acute displaced fracture or dislocation. A moderate-sized plantar calcaneal spur is noted. Advanced vascular calcifications are noted. There is no radiopaque foreign body. No significant surrounding soft tissue swelling. IMPRESSION: No acute osseous abnormality. Electronically Signed   By: Constance Holster M.D.   On: 04/15/2019 21:23    Medications:    apixaban  5 mg Oral BID   [START ON 04/17/2019] calcium-vitamin D  1 tablet Oral 3 times weekly   carvedilol  25 mg Oral BID   cholecalciferol  2,000 Units Oral Daily   fenofibrate  54 mg Oral Daily   hydrALAZINE  10 mg Oral Q6H   insulin aspart  0-15 Units Subcutaneous TID WC   insulin aspart  0-5 Units Subcutaneous QHS   insulin aspart protamine- aspart  75 Units Subcutaneous BID WC   mupirocin ointment  1 application Topical BID   pravastatin  40 mg Oral q1800   [START ON 04/17/2019] prenatal vitamin w/FE, FA  1 tablet Oral 3 times weekly   torsemide  20 mg Oral BID   [START ON 04/17/2019] vitamin E  400 Units Oral 3 times weekly   Continuous Infusions:  cefTRIAXone (ROCEPHIN)  IV     And   metronidazole       LOS: 0 days   Geradine Girt  [facility]   How to contact the [facility] Attending or Consulting provider [place] or covering provider during after hours [place], for this patient?  1. Check the care team in [facility] and look for a) attending/consulting [facility] provider listed and b) the [facility] team listed 2. Log into [email or web address] and use [place]'s universal password to access. If you do not have the password, please contact the hospital operator. 3. Locate the [facility] provider you are looking for under [facility] and page to a number that you can be directly reached. 4. If you still have difficulty reaching the provider, please page the [facility] (Director on Call) for the Hospitalists listed on amion for assistance.  04/16/2019, 11:55 AM

## 2019-04-16 NOTE — ED Notes (Signed)
ED TO INPATIENT HANDOFF REPORT  ED Nurse Name and Phone #: [phone]  S Name/Age/Gender [name] 68 y.o. female Room/Bed: RESUSC/RESUSC  Code Status   Code Status: Full Code  Home/SNF/Other Home Patient oriented to: self, place and situation Is this baseline? Yes   Triage Complete: Triage complete  Chief Complaint weakness confusion  Triage Note Patient is from home, becoming progressively weak in the last week per family.  She is nauseated upon arrival to ED.  She has a low grade fever per EMS.  Right heel has a pressure ulcer on the lateral aspect of the heel.  Patient seems confused to family, but does answer questions appropriately with EMS, slow to answer questions.  Patient was given 9mcg Fentanyl en route for heel pain.   Allergies Allergies  Allergen Reactions  . Amlodipine Swelling    Ankle swelling  . Atorvastatin Other (See Comments)    Myalgias   . Crestor [Rosuvastatin Calcium] Other (See Comments)    Stiffness and back pain  . Ezetimibe-Simvastatin Other (See Comments)    Leg cramps  . Statins Other (See Comments)    Myalgias  . Celebrex [Celecoxib] Rash  . Levemir [Insulin Detemir] Rash  . Penicillins Other (See Comments)    SYNCOPE (Tolerated Ancef) Has patient had a PCN reaction causing immediate rash, facial/tongue/throat swelling, SOB or lightheadedness with hypotension: Yes Has patient had a PCN reaction causing severe rash involving mucus membranes or skin necrosis: No Has patient had a PCN reaction that required hospitalization No Has patient had a PCN reaction occurring within the last 10 years: No If all of the above answers are "NO", then may proceed with Cephalosporin use.    Level of Care/Admitting Diagnosis ED Disposition    ED Disposition Condition [facility] Area: [place] [[id number]]  Level of Care: Telemetry Medical [104]  I expect the patient will be discharged within 24 hours: Yes  LOW  acuity---Tx typically complete <24 hrs---ACUTE conditions typically can be evaluated <24 hours---LABS likely to return to acceptable levels <24 hours---IS near functional baseline---EXPECTED to return to current living arrangement---NOT newly hypoxic: Meets criteria for 5C-Observation unit  Covid Evaluation: Asymptomatic Screening Protocol (No Symptoms)  Diagnosis: Weakness generalized [034742]  Admitting Physician: Elwyn Reach [[id number]]  Attending Physician: Elwyn Reach [[id number]]  PT Class (Do Not Modify): Observation [104]  PT Acc Code (Do Not Modify): Observation [10022]       B Medical/Surgery History Past Medical History:  Diagnosis Date  . Atrial fibrillation ([place])    a. maintaining sinus s/p DCCV, on Eliquis  . CAD (coronary artery disease)    a. Nonobstructive by cath 2006 - 25% LAD, 25-30% after 1st diag, distal 25% PDA, minor irreg LCx. b. Normal nuc 2011.  . CKD (chronic kidney disease), stage III ([place])   . Cystocele   . Difficult intubation 09-07-2012   big neck trouble with intubation parotid surgery"takes little med to sedate"  . Esophageal stricture   . GERD (gastroesophageal reflux disease)   . Hiatal hernia    a. 07/2013: HH with small stricture holding up barium tablet (concides with patient's sx of food sticking).  . History of kidney stones   . Hyperlipidemia   . Hypertension   . IBS (irritable bowel syndrome)   . Memory difficulty 11/26/2015  . Morbid obesity ([place])    a. hx of gastric bypass (sleeve gastrectomy 2015)  . Osteoporosis   . 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  [place] Kitchen Pericardial effusion    a. Mod by echo 2012 at time of PNA. b. Echo 07/2012: small pericardial effusion vs fat.  . S/P TAVR (transcatheter aortic valve replacement)   . Severe aortic stenosis   . Sleep apnea     "have mask; don't use it" (02/14/2018)  . TIA (transient ischemic attack) 10/2014; 06/2016   "some memory issues since; daughter says my speech is  sometimes different" (02/14/2018)  . Type II diabetes mellitus ([place])    Past Surgical History:  Procedure Laterality Date  . BREATH TEK H PYLORI N/A 07/29/2013   Procedure: Rafael Hernandez;  Surgeon: Shann Medal, MD;  Location: [facility];  Service: General;  Laterality: N/A;  . CARDIAC CATHETERIZATION  2006  . CARDIOVERSION N/A 02/16/2018   Procedure: CARDIOVERSION;  Surgeon: Pixie Casino, MD;  Location: [place];  Service: Cardiovascular;  Laterality: N/A;  . CARPAL TUNNEL RELEASE Left 2011  . CATARACT EXTRACTION W/ INTRAOCULAR LENS  IMPLANT, BILATERAL Bilateral 2003   left  . CHOLECYSTECTOMY OPEN  1982  . COLONOSCOPY N/A 07/14/2015   Procedure: COLONOSCOPY;  Surgeon: Ladene Artist, MD;  Location: [facility];  Service: Endoscopy;  Laterality: N/A;  . ESOPHAGOGASTRODUODENOSCOPY N/A 12/27/2013   Procedure: ESOPHAGOGASTRODUODENOSCOPY (EGD);  Surgeon: Shann Medal, MD;  Location: [facility];  Service: General;  Laterality: N/A;  . ESOPHAGOGASTRODUODENOSCOPY (EGD) WITH ESOPHAGEAL DILATION    . INTRAOPERATIVE TRANSTHORACIC ECHOCARDIOGRAM  05/08/2018   Procedure: INTRAOPERATIVE TRANSTHORACIC ECHOCARDIOGRAM;  Surgeon: Burnell Blanks, MD;  Location: [facility];  Service: Open Heart Surgery;;  . LAPAROSCOPIC GASTRIC SLEEVE RESECTION N/A 01/20/2014   Procedure: LAPAROSCOPIC GASTRIC SLEEVE RESECTION;  Surgeon: Shann Medal, MD;  Location: [facility];  Service: General;  Laterality: N/A;  . PAROTIDECTOMY  09/07/2012   Procedure: PAROTIDECTOMY;  Surgeon: Melida Quitter, MD;  Location: [facility];  Service: ENT;  Laterality: Left;  LEFT PAROTIDECTOMY  . RIGHT HEART CATH N/A 09/25/2018   Procedure: RIGHT HEART CATH;  Surgeon: Larey Dresser, MD;  Location: [facility];  Service: Cardiovascular;  Laterality: N/A;  . RIGHT/LEFT HEART CATH AND CORONARY ANGIOGRAPHY N/A 03/29/2018   Procedure: RIGHT/LEFT HEART CATH AND CORONARY ANGIOGRAPHY;  Surgeon: Burnell Blanks, MD;  Location:  [facility];  Service: Cardiovascular;  Laterality: N/A;  . TEE WITHOUT CARDIOVERSION N/A 02/16/2018   Procedure: TRANSESOPHAGEAL ECHOCARDIOGRAM (TEE);  Surgeon: Pixie Casino, MD;  Location: [facility];  Service: Cardiovascular;  Laterality: N/A;  . TOE AMPUTATION Left 07/2017   great toe; [facility]   . TONSILLECTOMY  1968  . TRANSCATHETER AORTIC VALVE REPLACEMENT, TRANSFEMORAL  05/08/2018   Transcatheter Aortic Valve Replacement   . TRANSCATHETER AORTIC VALVE REPLACEMENT, TRANSFEMORAL N/A 05/08/2018   Procedure: TRANSCATHETER AORTIC VALVE REPLACEMENT, TRANSFEMORAL;  Surgeon: Burnell Blanks, MD;  Location: [facility];  Service: Open Heart Surgery;  Laterality: N/A;  . TUBAL LIGATION  yrs ago  . UPPER GI ENDOSCOPY  01/20/2014   Procedure: UPPER GI ENDOSCOPY;  Surgeon: Shann Medal, MD;  [facility];  Service: General;;     A IV Location/Drains/Wounds Patient Lines/Drains/Airways Status   Active Line/Drains/Airways    Name:   Placement date:   Placement time:   Site:   Days:   Peripheral IV 04/15/19 Left Antecubital   04/15/19    -    Antecubital   1          Intake/Output Last 24 hours  Intake/Output Summary (Last 24 hours) at 04/16/2019 0145 Last data filed at 04/16/2019 0115 Gross per 24 hour  Intake 1000 ml  Output -  Net 1000 ml    Labs/Imaging Results for orders placed or performed during the hospital encounter of 04/15/19 (from the past 48 hour(s))  Lactic acid, plasma     Status: None   Collection Time: 04/15/19  8:49 PM  Result Value Ref Range   Lactic Acid, Venous 1.7 0.5 - 1.9 mmol/L    Comment: Performed at [facility], [address], [place], [place] [address]  Comprehensive metabolic panel     Status: Abnormal   Collection Time: 04/15/19  8:53 PM  Result Value Ref Range   Sodium 137 135 - 145 mmol/L   Potassium 3.7 3.5 - 5.1 mmol/L   Chloride 95 (L) 98 - 111 mmol/L   CO2 27 22 - 32 mmol/L   Glucose, Bld 249 (H) 70 - 99 mg/dL   BUN 38  (H) 8 - 23 mg/dL   Creatinine, Ser 2.33 (H) 0.44 - 1.00 mg/dL   Calcium 8.8 (L) 8.9 - 10.3 mg/dL   Total Protein 6.6 6.5 - 8.1 g/dL   Albumin 3.3 (L) 3.5 - 5.0 g/dL   AST 19 15 - 41 U/L   ALT 18 0 - 44 U/L   Alkaline Phosphatase 82 38 - 126 U/L   Total Bilirubin 0.7 0.3 - 1.2 mg/dL   GFR calc non Af Amer 21 (L) >60 mL/min   GFR calc Af Amer 24 (L) >60 mL/min   Anion gap 15 5 - 15    Comment: Performed at [facility], [place] [address], [place], [place] [address]  CBC WITH DIFFERENTIAL     Status: Abnormal   Collection Time: 04/15/19  8:53 PM  Result Value Ref Range   WBC 8.2 4.0 - 10.5 K/uL   RBC 5.20 (H) 3.87 - 5.11 MIL/uL   Hemoglobin 14.5 12.0 - 15.0 g/dL   HCT 44.7 36.0 - 46.0 %   MCV 86.0 80.0 - 100.0 fL   MCH 27.9 26.0 - 34.0 pg   MCHC 32.4 30.0 - 36.0 g/dL   RDW 13.3 11.5 - 15.5 %   Platelets 189 150 - 400 K/uL   nRBC 0.0 0.0 - 0.2 %   Neutrophils Relative % 79 %   Neutro Abs 6.6 1.7 - 7.7 K/uL   Lymphocytes Relative 10 %   Lymphs Abs 0.8 0.7 - 4.0 K/uL   Monocytes Relative 8 %   Monocytes Absolute 0.6 0.1 - 1.0 K/uL   Eosinophils Relative 1 %   Eosinophils Absolute 0.1 0.0 - 0.5 K/uL   Basophils Relative 1 %   Basophils Absolute 0.1 0.0 - 0.1 K/uL   Immature Granulocytes 1 %   Abs Immature Granulocytes 0.04 0.00 - 0.07 K/uL    Comment: Performed at [facility] [address] [place], [place] [address]  Lipase, blood     Status: None   Collection Time: 04/15/19  8:53 PM  Result Value Ref Range   Lipase 44 11 - 51 U/L    Comment: Performed at [facility] [address], [place], [place] [address]  CBG monitoring, ED     Status: Abnormal   Collection Time: 04/15/19 10:16 PM  Result Value Ref Range   Glucose-Capillary 270 (H) 70 - 99 mg/dL  Urinalysis, Routine w reflex microscopic     Status: Abnormal   Collection Time:  04/15/19 11:10 PM  Result Value Ref Range   Color, Urine YELLOW YELLOW   APPearance CLEAR CLEAR   Specific Gravity, Urine  1.012 1.005 - 1.030   pH 7.0 5.0 - 8.0   Glucose, UA >=500 (A) NEGATIVE mg/dL   Hgb urine dipstick NEGATIVE NEGATIVE   Bilirubin Urine NEGATIVE NEGATIVE   Ketones, ur NEGATIVE NEGATIVE mg/dL   Protein, ur 30 (A) NEGATIVE mg/dL   Nitrite NEGATIVE NEGATIVE   Leukocytes,Ua NEGATIVE NEGATIVE   RBC / HPF 0-5 0 - 5 RBC/hpf   WBC, UA 0-5 0 - 5 WBC/hpf   Bacteria, UA NONE SEEN NONE SEEN   Squamous Epithelial / LPF 0-5 0 - 5    Comment: Performed at [facility], [place] [address], [place], [place] [address]  Lactic acid, plasma     Status: None   Collection Time: 04/15/19 11:51 PM  Result Value Ref Range   Lactic Acid, Venous 1.3 0.5 - 1.9 mmol/L    Comment: Performed at [facility] [address] [place], [place] [address]  SARS Coronavirus 2 [facility] order, Performed in [facility] hospital lab) Nasopharyngeal Nasopharyngeal Swab     Status: None   Collection Time: 04/15/19 11:52 PM   Specimen: Nasopharyngeal Swab  Result Value Ref Range   SARS Coronavirus 2 NEGATIVE NEGATIVE    Comment: (NOTE) If result is NEGATIVE SARS-CoV-2 target nucleic acids are NOT DETECTED. The SARS-CoV-2 RNA is generally detectable in upper and lower  respiratory specimens during the acute phase of infection. The lowest  concentration of SARS-CoV-2 viral copies this assay can detect is 250  copies / mL. A negative result does not preclude SARS-CoV-2 infection  and should not be used as the sole basis for treatment or other  patient management decisions.  A negative result may occur with  improper specimen collection / handling, submission of specimen other  than nasopharyngeal swab, presence of viral mutation(s) within the  areas targeted by this assay, and inadequate number of viral copies  (<250 copies / mL). A negative result must be combined with clinical  observations, patient history, and epidemiological information. If result is POSITIVE SARS-CoV-2 target nucleic acids are DETECTED. The  SARS-CoV-2 RNA is generally detectable in upper and lower  respiratory specimens dur ing the acute phase of infection.  Positive  results are indicative of active infection with SARS-CoV-2.  Clinical  correlation with patient history and other diagnostic information is  necessary to determine patient infection status.  Positive results do  not rule out bacterial infection or co-infection with other viruses. If result is PRESUMPTIVE POSTIVE SARS-CoV-2 nucleic acids MAY BE PRESENT.   A presumptive positive result was obtained on the submitted specimen  and confirmed on repeat testing.  While 2019 novel coronavirus  (SARS-CoV-2) nucleic acids may be present in the submitted sample  additional confirmatory testing may be necessary for epidemiological  and / or clinical management purposes  to differentiate between  SARS-CoV-2 and other Sarbecovirus currently known to infect humans.  If clinically indicated additional testing with an alternate test  methodology [phone]) is advised. The SARS-CoV-2 RNA is generally  detectable in upper and lower respiratory sp ecimens during the acute  phase of infection. The expected result is Negative. Fact Sheet for Patients:  StrictlyIdeas.no Fact Sheet for Healthcare Providers: BankingDealers.co.za This test is not yet approved or cleared by the [place] FDA and has been authorized for detection and/or diagnosis of SARS-CoV-2 by FDA under an Emergency  Use Authorization (EUA).  This EUA will remain in effect (meaning this test can be used) for the duration of the COVID-19 declaration under Section 564(b)(1) of the Act, 21 U.S.C. section 360bbb-3(b)(1), unless the authorization is terminated or revoked sooner. Performed at [facility], [place] [address] [place],  [address]   CBG monitoring, ED     Status: Abnormal   Collection Time: 04/16/19 12:59 AM  Result Value Ref Range    Glucose-Capillary 294 (H) 70 - 99 mg/dL   Ct Abdomen Pelvis Wo Contrast  Result Date: 04/15/2019 CLINICAL DATA:  Abdominal pain, fever, suspected abscess, nausea vomiting. EXAM: CT ABDOMEN AND PELVIS WITHOUT CONTRAST TECHNIQUE: Multidetector CT imaging of the abdomen and pelvis was performed following the standard protocol without IV contrast. COMPARISON:  CT renal colic Jan 04, 5464 FINDINGS: Lower chest: Bandlike areas of opacity in the lung bases may reflect a combination of atelectasis and/or scarring. There is prominent pericardial fat. There is a small pericardial effusion which is similar to comparison CT. Cardiac size is at the upper limits of normal. Transcatheter aortic valve replacement is noted. Dense mitral annular calcifications are seen. Atherosclerotic calcification of the coronary arteries. Hepatobiliary: No focal liver abnormality is seen. Patient is post cholecystectomy. Slight prominence of the biliary tree likely related to reservoir effect. No calcified intraductal gallstones. Pancreas: Unremarkable. No pancreatic ductal dilatation or surrounding inflammatory changes. Spleen: Normal in size without focal abnormality. Small accessory splenule. Adrenals/Urinary Tract: Normal adrenal glands. The kidneys appear diminutive in size and atrophic. There is focal Peri in a lymph a may shin in the upper pole of the left kidney though this is overall similar to comparison CT dated Jan 03, 2017. No visible renal lesions. No urolithiasis or hydronephrosis. Normal appearance of the bladder. Stomach/Bowel: Distal esophagus is unremarkable there are postsurgical changes from prior gastric sleeve. Duodenal sweep is normal course without focal abnormality. No small bowel dilatation or wall thickening. A normal appendix is visualized. Scattered colonic diverticula without focal pericolonic inflammation to suggest diverticulitis. No abnormal colonic thickening or dilatation. Vascular/Lymphatic: Extensive  atheromatous plaque throughout the aorta and branch vessels. No suspicious or enlarged lymph nodes in the included lymphatic chains. Reproductive: Normal appearance of the uterus and adnexal structures. Other: No abdominopelvic free fluid or free gas. No bowel containing hernias. Musculoskeletal: There is marked laxity of the anterior abdominal wall which is more pronounced than on comparison priors with atrophy of the rectus muscle sheath. No acute osseous abnormality or suspicious osseous lesion. 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 Multilevel degenerative changes are present in the imaged portions of the spine. Additional degenerative features in the pelvis and hips. IMPRESSION: 1. No acute intra-abdominal process. Specifically, no evidence of abdominopelvic abscess or infection. 2. Marked laxity of the anterior abdominal wall, more pronounced than on comparison priors. No bowel containing hernias. 3. Prior transcatheter aortic valve replacement. 4. Atrophic appearance of the kidneys, correlate with renal function. 5. Stable pericardial effusion. 6. Prior gastric sleeve. 7. Aortic Atherosclerosis (ICD10-I70.0). Electronically Signed   By: Lovena Le M.D.   On: 04/15/2019 23:02   Ct Head Wo Contrast  Result Date: 04/15/2019 CLINICAL DATA:  Altered level of consciousness, nausea EXAM: CT HEAD WITHOUT CONTRAST TECHNIQUE: Contiguous axial images were obtained from the base of the skull through the vertex without intravenous contrast. COMPARISON:  MR, CT 06/16/2018 FINDINGS: Brain: No evidence of acute infarction, hemorrhage, hydrocephalus, extra-axial collection or mass lesion/mass effect. Symmetric prominence  of the ventricles, cisterns and sulci compatible with parenchymal volume loss. Patchy areas of white matter hypoattenuation are most compatible with chronic microvascular angiopathy. Senescent mineralization of the basal ganglia  bilaterally. Benign dural calcifications. Vascular: Atherosclerotic calcification of the carotid siphons and intradural vertebral arteries. No hyperdense vessel. Skull: Hyperostosis frontalis interna, a benign incidental finding. No calvarial fracture or suspicious osseous lesion. No scalp swelling or hematoma. Sinuses/Orbits: Paranasal sinuses and mastoid air cells are predominantly clear. Orbital structures are unremarkable aside from prior lens extractions. Other: None. IMPRESSION: No acute intracranial abnormality. Chronic microvascular angiopathy, volume loss and intracranial atherosclerosis. Electronically Signed   By: Lovena Le M.D.   On: 04/15/2019 23:05   Dg Chest Port 1 View  Result Date: 04/15/2019 CLINICAL DATA:  Fever EXAM: PORTABLE CHEST 1 VIEW COMPARISON:  September 05, 2018 FINDINGS: The patient is status post prior TAVR. The heart size remains enlarged. Aortic calcifications are noted. There is no focal infiltrate. The previously noted left-sided airspace opacity has improved. There is no significant pleural effusion. No significant pneumothorax. There may be some mild volume overload without overt pulmonary edema. IMPRESSION: No active disease. Electronically Signed   By: Constance Holster M.D.   On: 04/15/2019 21:22   Dg Foot Complete Right  Result Date: 04/15/2019 CLINICAL DATA:  He will pressure. EXAM: RIGHT FOOT COMPLETE - 3+ VIEW COMPARISON:  None. FINDINGS: There is no acute displaced fracture or dislocation. A moderate-sized plantar calcaneal spur is noted. Advanced vascular calcifications are noted. There is no radiopaque foreign body. No significant surrounding soft tissue swelling. IMPRESSION: No acute osseous abnormality. Electronically Signed   By: Constance Holster M.D.   On: 04/15/2019 21:23    Pending Labs Unresulted Labs (From admission, onward)    Start     Ordered   04/16/19 0500  Comprehensive metabolic panel  Tomorrow morning,   R     04/16/19 0027   04/16/19  0500  CBC  Tomorrow morning,   R     04/16/19 0027   04/16/19 0028  Hemoglobin A1c  Once,   STAT    Comments: To assess prior glycemic control    04/16/19 0027   04/16/19 0028  HIV antibody (Routine Testing)  Once,   STAT     04/16/19 0027   04/15/19 2049  Blood Culture (routine x 2)  BLOOD CULTURE X 2,   STAT     04/15/19 2049   04/15/19 2049  Urine culture  ONCE - STAT,   STAT     04/15/19 2049          Vitals/Pain Today's Vitals   04/15/19 2313 04/15/19 2330 04/15/19 2353 04/16/19 0100  BP:  (!) 134/42  (!) 139/57  Pulse:  82  85  Resp:  (!) 24  (!) 23  Temp: 98.6 F (37 C)     TempSrc: Oral     SpO2:  90%  93%  Weight:   97.1 kg   PainSc:        Isolation Precautions No active isolations  Medications Medications  insulin aspart (novoLOG) injection 0-15 Units (has no administration in time range)  insulin aspart (novoLOG) injection 0-5 Units (3 Units Subcutaneous Given 04/16/19 0108)  0.9 %  sodium chloride infusion ( Intravenous Transfusing/Transfer 04/16/19 0143)  ondansetron (ZOFRAN) tablet 4 mg (has no administration in time range)    Or  ondansetron (ZOFRAN) injection 4 mg (has no administration in time range)  vancomycin (VANCOCIN) 1,500 mg in sodium chloride 0.9 %  500 mL IVPB (1,500 mg Intravenous Transfusing/Transfer 04/16/19 0142)  acetaminophen (TYLENOL) tablet 650 mg (650 mg Oral Given 04/15/19 2129)  ondansetron (ZOFRAN) injection 4 mg (4 mg Intravenous Given 04/15/19 2058)  sodium chloride 0.9 % bolus 1,000 mL (0 mLs Intravenous Stopped 04/16/19 0115)  ceFEPIme (MAXIPIME) 2 g in sodium chloride 0.9 % 100 mL IVPB (0 g Intravenous Stopped 04/16/19 0103)    Mobility walks with person assist High fall risk   Focused Assessments Neuro Assessment Handoff:   Cardiac Rhythm: Normal sinus rhythm       Neuro Assessment: Within Defined Limits Neuro Checks:         R Recommendations: See Admitting Provider Note  Report given to:   Additional Notes:

## 2019-04-17 ENCOUNTER — Other Ambulatory Visit: Payer: Self-pay

## 2019-04-17 DIAGNOSIS — E1151 Type 2 diabetes mellitus with diabetic peripheral angiopathy without gangrene: Secondary | ICD-10-CM

## 2019-04-17 DIAGNOSIS — E11622 Type 2 diabetes mellitus with other skin ulcer: Secondary | ICD-10-CM

## 2019-04-17 DIAGNOSIS — I350 Nonrheumatic aortic (valve) stenosis: Secondary | ICD-10-CM

## 2019-04-17 DIAGNOSIS — Z888 Allergy status to other drugs, medicaments and biological substances status: Secondary | ICD-10-CM

## 2019-04-17 DIAGNOSIS — N183 Chronic kidney disease, stage 3 (moderate): Secondary | ICD-10-CM

## 2019-04-17 DIAGNOSIS — Z952 Presence of prosthetic heart valve: Secondary | ICD-10-CM

## 2019-04-17 DIAGNOSIS — E11621 Type 2 diabetes mellitus with foot ulcer: Secondary | ICD-10-CM

## 2019-04-17 DIAGNOSIS — Z880 Allergy status to penicillin: Secondary | ICD-10-CM

## 2019-04-17 DIAGNOSIS — L89510 Pressure ulcer of right ankle, unstageable: Secondary | ICD-10-CM

## 2019-04-17 DIAGNOSIS — R7881 Bacteremia: Secondary | ICD-10-CM

## 2019-04-17 DIAGNOSIS — Z9884 Bariatric surgery status: Secondary | ICD-10-CM

## 2019-04-17 DIAGNOSIS — L97419 Non-pressure chronic ulcer of right heel and midfoot with unspecified severity: Secondary | ICD-10-CM

## 2019-04-17 DIAGNOSIS — R011 Cardiac murmur, unspecified: Secondary | ICD-10-CM

## 2019-04-17 DIAGNOSIS — B952 Enterococcus as the cause of diseases classified elsewhere: Secondary | ICD-10-CM

## 2019-04-17 DIAGNOSIS — E1122 Type 2 diabetes mellitus with diabetic chronic kidney disease: Secondary | ICD-10-CM

## 2019-04-17 LAB — GLUCOSE, CAPILLARY
Glucose-Capillary: 132 mg/dL — ABNORMAL HIGH (ref 70–99)
Glucose-Capillary: 150 mg/dL — ABNORMAL HIGH (ref 70–99)
Glucose-Capillary: 206 mg/dL — ABNORMAL HIGH (ref 70–99)
Glucose-Capillary: 274 mg/dL — ABNORMAL HIGH (ref 70–99)
Glucose-Capillary: 252 mg/dL — ABNORMAL HIGH (ref 70–99)

## 2019-04-17 LAB — CBC
HCT: 36.5 % (ref 36.0–46.0)
Hemoglobin: 12 g/dL (ref 12.0–15.0)
MCH: 28.1 pg (ref 26.0–34.0)
MCHC: 32.9 g/dL (ref 30.0–36.0)
MCV: 85.5 fL (ref 80.0–100.0)
Platelets: 167 10*3/uL (ref 150–400)
RBC: 4.27 MIL/uL (ref 3.87–5.11)
RDW: 13.5 % (ref 11.5–15.5)
WBC: 9 10*3/uL (ref 4.0–10.5)
nRBC: 0 % (ref 0.0–0.2)

## 2019-04-17 LAB — BLOOD CULTURE ID PANEL (REFLEXED)

## 2019-04-17 LAB — BASIC METABOLIC PANEL
Anion gap: 10 (ref 5–15)
BUN: 40 mg/dL — ABNORMAL HIGH (ref 8–23)
CO2: 26 mmol/L (ref 22–32)
Calcium: 8.4 mg/dL — ABNORMAL LOW (ref 8.9–10.3)
Chloride: 103 mmol/L (ref 98–111)
Creatinine, Ser: 2.3 mg/dL — ABNORMAL HIGH (ref 0.44–1.00)
GFR calc Af Amer: 24 mL/min — ABNORMAL LOW (ref >60)
GFR calc non Af Amer: 21 mL/min — ABNORMAL LOW (ref >60)
Glucose, Bld: 132 mg/dL — ABNORMAL HIGH (ref 70–99)
Potassium: 3.9 mmol/L (ref 3.5–5.1)
Sodium: 139 mmol/L (ref 135–145)

## 2019-04-17 LAB — URINE CULTURE: Culture: NO GROWTH

## 2019-04-17 MED ORDER — VANCOMYCIN HCL IN DEXTROSE 1-5 GM/200ML-% IV SOLN: 1000.0000 mg | INTRAVENOUS | Status: DC

## 2019-04-17 MED ORDER — VANCOMYCIN HCL 10 G IV SOLR
2000.0000 mg | Freq: Once | INTRAVENOUS | Status: AC
  Administered 2019-04-17: 2000 mg via INTRAVENOUS
  Filled 2019-04-17: qty 2000

## 2019-04-17 MED ORDER — ACETAMINOPHEN 325 MG PO TABS
650.0000 mg | ORAL_TABLET | Freq: Four times a day (QID) | ORAL | Status: DC | PRN
  Administered 2019-04-17 – 2019-04-18 (×2): 650 mg via ORAL
  Filled 2019-04-17 (×3): qty 2

## 2019-04-17 MED ORDER — JUVEN PO PACK
1.0000 | PACK | Freq: Two times a day (BID) | ORAL | Status: DC
  Administered 2019-04-18 – 2019-04-26 (×13): 1 via ORAL
  Filled 2019-04-17 (×19): qty 1

## 2019-04-17 MED ORDER — ENSURE MAX PROTEIN PO LIQD
11.0000 [oz_av] | Freq: Every day | ORAL | Status: DC
  Administered 2019-04-18 – 2019-04-25 (×4): 11 [oz_av] via ORAL
  Filled 2019-04-17 (×11): qty 330

## 2019-04-17 MED ORDER — AMOXICILLIN 500 MG PO CAPS
500.0000 mg | ORAL_CAPSULE | Freq: Once | ORAL | Status: AC
  Administered 2019-04-17: 500 mg via ORAL
  Filled 2019-04-17: qty 1

## 2019-04-17 MED ORDER — SODIUM CHLORIDE 0.9 % IV SOLN
3.0000 g | Freq: Two times a day (BID) | INTRAVENOUS | Status: DC
  Administered 2019-04-17 – 2019-04-25 (×15): 3 g via INTRAVENOUS
  Filled 2019-04-17 (×2): qty 3
  Filled 2019-04-17: qty 8
  Filled 2019-04-17 (×2): qty 3
  Filled 2019-04-17: qty 8
  Filled 2019-04-17 (×2): qty 3
  Filled 2019-04-17: qty 8
  Filled 2019-04-17 (×8): qty 3
  Filled 2019-04-17: qty 8
  Filled 2019-04-17: qty 3

## 2019-04-17 NOTE — Consult Note (Signed)
[place] Nurse wound consult note Patient receiving care in [facility] [phone]. Reason for Consult: right heel wound Wound type: Stage 3 PI Pressure Injury POA: Yes Measurement:2.2 cm x 3 cm x 0.3 cm Wound bed: pink with yellow biofilm Drainage (amount, consistency, odor) scant on existing foam dressing Periwound: intact Dressing procedure/placement/frequency: Daily application of a hydrocolloid dressing Kellie Simmering [phone]). Left foot toes 2 & 3 have scabs.  For these areas twice daily application of iodine and allow to air dry. Monitor the wound area(s) for worsening of condition such as: Signs/symptoms of infection,  Increase in size,  Development of or worsening of odor, Development of pain, or increased pain at the affected locations.  Notify the medical team if any of these develop.  Thank you for the consult.  Discussed plan of care with the patient and bedside nurse.  [place] nurse will not follow at this time.  Please re-consult the [facility] team if needed.  Val Riles, RN, MSN, CWOCN, CNS-BC, pager [phone]

## 2019-04-17 NOTE — TOC Initial Note (Signed)
Transition of Care [facility]) - Initial/Assessment Note    Patient Details  Name: [name] MRN: [id number] Date of Birth: 28-Apr-1951  Transition [facility] CM/SW Contact:    Andris Flurry Phone Number: [phone] 04/17/2019, 10:48 AM  Clinical Narrative:                 CSW spoke with pt at bedside. Introduced self, role, reason for visit. Pt not very interactive during the assessment with obvious discomfort in bed. Confirmed home address and PCP. Pt from home with her husband, she has three adult children (one of which is an RT here at [facility] and the other is an Therapist, sports per her report). Pt home is two level but she functions on the first floor with assistance from her husband and family. She has had multiple falls and feels these have led to a decline in her function, but she usually gets around utilizing a walker and wheelchair if she needs to go long distances. She has not been receiving any wound care to her foot.  We discussed PT recommendations and assistance at home; pt against SNF and requests to return home with home health. She has utilized that in the past; other than her cane and wheelchair she has a bedside commode, shower bench, and other DME at home.   CSW will return with [facility] list; encouraged pt to discuss her current needs with her family to ensure they can provide her with the physical assistance she needs at the current time.   Expected Discharge Plan: [place] Barriers to Discharge: Continued Medical Work up   Patient Goals and CMS Choice Patient states their goals for this hospitalization and ongoing recovery are:: to go home CMS Medicare.gov Compare Post Acute Care list provided to:: Patient Choice offered to / list presented to : Patient  Expected Discharge Plan and Services Expected Discharge Plan: [place] In-house Referral: Clinical Social Work Discharge Planning Services: CM Consult Post Acute Care Choice: [place] arrangements for the past 2 months: Single Family Home HH Arranged: PT, OT, RN  Prior Living Arrangements/Services Living arrangements for the past 2 months: Single Family Home Lives with:: Spouse Patient language and need for interpreter reviewed:: Yes(no needs)        Need for Family Participation in Patient Care: Yes (Comment)(assistance with ADL/IADLs; support) Care giver support system in place?: Yes (comment)(adult children, spouse) Current home services: DME Criminal Activity/Legal Involvement Pertinent to Current Situation/Hospitalization: No - Comment as needed  Activities of Daily Living Home Assistive Devices/Equipment: Wheelchair, Environmental consultant (specify type) ADL Screening (condition at time of admission) Patient's cognitive ability adequate to safely complete daily activities?: No Is the patient deaf or have difficulty hearing?: No Does the patient have difficulty seeing, even when wearing glasses/contacts?: No Does the patient have difficulty concentrating, remembering, or making decisions?: No Patient able to express need for assistance with ADLs?: Yes Does the patient have difficulty dressing or bathing?: No Independently performs ADLs?: Yes (appropriate for developmental age) Does the patient have difficulty walking or climbing stairs?: Yes Weakness of Legs: Right Weakness of Arms/Hands: None  Permission Sought/Granted Permission sought to share information with : Family Supports Permission granted to share information with : Yes, Verbal Permission Granted  Share Information with NAME: Ajee Heasley  Permission granted to share info w AGENCY: [place] granted to share info w Relationship: spouse  Permission granted to share info w Contact Information: [phone]  Emotional Assessment Appearance:: Appears older than stated age Attitude/Demeanor/Rapport: Guarded Affect (typically observed): Flat, Guarded Orientation: : Oriented to Place, Oriented  to Self, Oriented to  Time, Oriented to Situation Alcohol / Substance Use: Not Applicable Psych Involvement: No (comment)  Admission diagnosis:  Fever in adult [R50.9] Patient Active Problem List   Diagnosis Date Noted  . Pressure injury of skin 04/16/2019  . Infected wound 04/16/2019  . Weakness generalized 04/15/2019  . SIRS (systemic inflammatory response syndrome) ([place]) 04/15/2019  . Anasarca 09/19/2018  . Acute encephalopathy 06/16/2018  . 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 Acute on chronic diastolic CHF (congestive heart failure) ([place]) 05/10/2018  . Morbid obesity ([place])   . Hypertension   . CKD (chronic kidney disease), stage III ([place])   . Sleep apnea   . Hyperlipidemia   . GERD (gastroesophageal reflux disease)   . Atrial fibrillation ([place])   . S/P TAVR (transcatheter aortic valve replacement)   . Severe aortic stenosis   . Vertigo 05/06/2015  . TIA (transient ischemic attack), history of 11/07/2014  . 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 CKD stage 3 due to type 2 diabetes mellitus ([place]) 06/03/2014  . Type II diabetes mellitus with renal manifestations ([place]) 06/03/2014  . Type II diabetes mellitus with peripheral circulatory disorder ([place]) 06/03/2014  . Benign essential HTN 12/26/2008  . Coronary artery disease, non-occlusive 12/26/2008  . Pericardial effusion 12/26/2008   PCP:  [name], DO Pharmacy:   [id number] [place], [address] [place] [address] Phone: [phone] Fax: [phone]     Social Determinants of Health (SDOH) Interventions    Readmission Risk Interventions Readmission Risk Prevention Plan 05/10/2018  Transportation Screening Complete  PCP or Specialist Appt within 5-7 Days Complete  Home Care Screening Complete  Medication Review (RN CM) Complete  Some recent data might be hidden

## 2019-04-17 NOTE — Progress Notes (Signed)
In for patient medication administration, vitals taken. Patient O2 saturation mid 80's on room air. Instructed patient to breath deeply. Patient alert and oriented, BP 137/47, HR 94, RR 18. Placed patient on 2L [place] and oxygen improved to 92%%. Paged MD to notify of findings. Will continue to monitor closely for remainder of shift.

## 2019-04-17 NOTE — Progress Notes (Signed)
Patient received trial dose of amoxicillin, patient had no signs or symptoms of adverse reaction, vital signs stable. Will report to oncoming nursing staff.

## 2019-04-17 NOTE — Progress Notes (Signed)
PHARMACY - PHYSICIAN COMMUNICATION CRITICAL VALUE ALERT - BLOOD CULTURE IDENTIFICATION (BCID)  [name] is an 68 y.o. female who presented to [facility] on 04/15/2019 with a chief complaint of weakness.  Assessment:  This patient has 2/2 BCID positive blood cultures for Enterococcus sensitive to vancomycin. She is currently being empirically treated for foot infection with ceftriaxone and metronidazole.   Name of physician (or Provider) Contacted: Dr. Irene Pap  Current antibiotics: ceftriaxone, metronidazole  Changes to prescribed antibiotics recommended:  Will add vancomycin d/t patient's penicillin allergy.  Results for orders placed or performed during the hospital encounter of 04/15/19  Blood Culture ID Panel (Reflexed) (Collected: 04/15/2019  9:07 PM)  Result Value Ref Range   Enterococcus species DETECTED (A) NOT DETECTED   Vancomycin resistance NOT DETECTED NOT DETECTED   Listeria monocytogenes NOT DETECTED NOT DETECTED   Staphylococcus species NOT DETECTED NOT DETECTED   Staphylococcus aureus (BCID) NOT DETECTED NOT DETECTED   Streptococcus species NOT DETECTED NOT DETECTED   Streptococcus agalactiae NOT DETECTED NOT DETECTED   Streptococcus pneumoniae NOT DETECTED NOT DETECTED   Streptococcus pyogenes NOT DETECTED NOT DETECTED   Acinetobacter baumannii NOT DETECTED NOT DETECTED   Enterobacteriaceae species NOT DETECTED NOT DETECTED   Enterobacter cloacae complex NOT DETECTED NOT DETECTED   Escherichia coli NOT DETECTED NOT DETECTED   Klebsiella oxytoca NOT DETECTED NOT DETECTED   Klebsiella pneumoniae NOT DETECTED NOT DETECTED   Proteus species NOT DETECTED NOT DETECTED   Serratia marcescens NOT DETECTED NOT DETECTED   Haemophilus influenzae NOT DETECTED NOT DETECTED   Neisseria meningitidis NOT DETECTED NOT DETECTED   Pseudomonas aeruginosa NOT DETECTED NOT DETECTED   Candida albicans NOT DETECTED NOT DETECTED   Candida glabrata NOT DETECTED NOT DETECTED   Candida krusei NOT DETECTED NOT DETECTED   Candida parapsilosis NOT DETECTED NOT DETECTED   Candida tropicalis NOT DETECTED NOT DETECTED    Wallene Huh 04/17/2019  11:27 AM

## 2019-04-17 NOTE — Discharge Instructions (Signed)

## 2019-04-17 NOTE — Consult Note (Signed)
   [facility] CM Inpatient Consult   04/17/2019  [name] 1951/03/31 [id number]    Patient screened for potential need of [facility] care management services with high risk scoreof 22%for unplanned readmission and hospitalization, under her Medicare Next/ Gen benefit. Patient has been currently active with [facility] for disease management education and was outreached by [place] coordinators for EMMI follow-up calls in the past.  Review of patient's medical record reveals that: [name] Lawsonis an 68 y.o.femalewith medical history significant ofatrial fibrillation, chronic kidney disease stage III, morbid obesity, GERD with esophageal stricture, hyperlipidemia, hypertension, IBS, osteoporosis,    who presented with significant weakness which is global. Patient said she has gradually felt weak unable to move around. She was on her bedside commode at home when leaned forward on her bed and slipped to the floor. She could not get up from the floor.  On presentation she was found to have right foot diabetic ulcer which she noted since February 2020.Seen by wound care specialist with  recommendations.  Patient was admitted for observation and management of her weakness. (Generalized weakness/lethargy likely in the setting of active infective process, gram-positive bacteremia and right heel diabetic ulcer, Enterococcus bacteremia, Type 2 diabetes with hyperglycemia, A1c greater than 11)  Primary Care Provider Daiva Nakayama, DO with [facility] listed as providing transition of care follow-up.  Review of PT evaluation note recommending skilled nursing facility (short term stay) vs. Home with home health services, depending on progress.  Transition of care SW note states that patient is against SNF, and requests to return home with home health. Patient is from home with husband, she has three adult children (one of which is an RT  here at [facility] and the other is an Therapist, sports per her report). She usually gets around utilizing a walker and wheelchair if she needs to go long distances.  Patient is followed by Inpatient DM coordinator and Registered dietitianduring this admission.  Plan:Will continue to follow progress and disposition to assess for post hospital care management needs.  Of note, [facility] Care Management services does not replace or interfere with any services that are arranged by transition of care case management or social work.   For questions and referral, please contact:  Abdulloh Ullom A. Kasra Melvin, BSN, RN-BC [facility] Liaison Cell: [phone]

## 2019-04-17 NOTE — Progress Notes (Signed)
Initial Nutrition Assessment  RD working remotely.  DOCUMENTATION CODES:   Obesity unspecified  INTERVENTION:   -MVI with minerals daily -Ensure Max po daily, each supplement provides 150 kcal and 30 grams of protein -1 packet Juven BID, each packet provides 95 calories, 2.5 grams of protein (collagen), and 9.8 grams of carbohydrate (3 grams sugar); also contains 7 grams of L-arginine and L-glutamine, 300 mg vitamin C, 15 mg vitamin E, 1.2 mcg vitamin B-12, 9.5 mg zinc, 200 mg calcium, and 1.5 g  Calcium Beta-hydroxy-Beta-methylbutyrate to support wound healing  NUTRITION DIAGNOSIS:   Increased nutrient needs related to wound healing as evidenced by estimated needs.  GOAL:   Patient will meet greater than or equal to 90% of their needs  MONITOR:   PO intake, Supplement acceptance, Labs, Weight trends, Skin, I & O's  REASON FOR ASSESSMENT:   Consult Wound healing  ASSESSMENT:   [name] is a 68 y.o. female with medical history significant of atrial fibrillation, chronic kidney disease stage III, morbid obesity, GERD with esophageal stricture, hyperlipidemia, hypertension, IBS, osteoporosis who presented with significant weakness which is global.  Patient said she has gradually felt weak unable to move around.  She was on her bedside commode at home when she slipped off and fell.  She could not get up from the floor.  She called her daughter who now called EMS.  Patient was seen by EMS patient was slightly altered.  She denied any fever or chills denied any exposure to cold or any sick contact.  When she arrived the ER temperature was 100.4.  Patient denied any abdominal pain or diarrhea no other source of her low-grade temperature.  She was globally noted to be weak however.  She has chronic right heel wound which is chronic and then stable.  No obvious drainage and no obvious infection.  Patient is being admitted for observation and management of her weakness.  Pt admitted  with generalized weakness.   Reviewed I/O's: +600 ml x 24 hours and +1.6 L since admission  Attempted to call pt x 2, however, unable to reach.   Pt with good appetite presently. Noted meal completion 60-100%.   Per wt hx; pt wt has been stable over the past 6 months.  Per [facility] note, pt with stage 3 pressure injury to rt heel and scabs on lt toes 2-3.   Lab Results  Component Value Date   HGBA1C 11.0 (H) 04/16/2019   PTA DM medications are 75 units insulin aspart protamine-aspart daily in AM and 60 units insulin aspart protamine-aspart at lunch and HS. Suspect some CBGS/Hgb A1c elevations related to stress from wounds. Pt is followed by endocrinologist (Dr. Chalmers Cater) and recently adjusted outpatient insulin regimen per DM coordinator note.   Labs reviewed: CBGS: 132-154 (inpatient orders for glycemic control are 0-15 units insulin aspart TID with meals, 0-5 units insulin aspart q HS, and 75 unit insulin aspart-protamine- aspart BID).   Diet Order:   Diet Order            Diet heart healthy/carb modified Room service appropriate? Yes; Fluid consistency: Thin  Diet effective now              EDUCATION NEEDS:   No education needs have been identified at this time  Skin:  Skin Assessment: Skin Integrity Issues: Skin Integrity Issues:: Stage III, Other (Comment) Stage III: rt heel Other: lt foot toes 2-3 have scabs  Last BM:  04/17/19  Height:   Ht  Readings from Last 1 Encounters:  04/16/19 5\' 7"  (1.702 m)    Weight:   Wt Readings from Last 1 Encounters:  04/16/19 93.5 kg    Ideal Body Weight:  61.4 kg  BMI:  Body mass index is 32.28 kg/m.  Estimated Nutritional Needs:   Kcal:  1850-2050  Protein:  110-125 grams  Fluid:  > 1.8 L    Deavin Forst A. Jimmye Norman, RD, LDN, [place] Registered Dietitian II Certified Diabetes Care and Education Specialist Pager: [phone] After hours Pager: [phone]

## 2019-04-17 NOTE — Progress Notes (Signed)
LATE ID Note Entry:  Patient tolerated oral challenge of amoxicillin per note review.   Discussed with Dr Prince Rome and will proceed with initiating unasyn IV to treat enterococcus bacteremia and in consideration of covering other pathogens for her open chronic wound.   Janene Madeira, NP

## 2019-04-17 NOTE — Progress Notes (Signed)
Pharmacy Antibiotic Note  [name] is a 68 y.o. female admitted on 04/15/2019 with weakness. She was found to have a suspected foot infection for which she is being treated empirically with ceftriaxone and metronidazole. Today, she has 2/2 blood cultures positive for Enterococcus sensitive to vancomycin. Since patient has penicillin allergy, pharmacy was consulted for vancomycin dosing.   Patient has remote hx of penicillin allergy. Today, she took amoxicillin 500 mg PO X 1 without any adverse effects. Vancomycin, ceftriaxone and metronidazole have been discontinued by ID, and pharmacy has been consulted to dose Unasyn for enterococcal bacteremia and wound infection.  WBC 9.0, Tmax 101 F at 14:24 today. Scr 2.3, CrCl 27.5 mL/min (renal function ~stable)  Plan: Unasyn 3 gm IV Q 12 hrs Monitor CBC, renal function, clinical improvement, repeat blood cx  Height: 5\' 7"  (170.2 cm) Weight: 206 lb 2.1 oz (93.5 kg) IBW/kg (Calculated) : 61.6  Temp (24hrs), Avg:99 F (37.2 C), Min:98.2 F (36.8 C), Max:101 F (38.3 C)  Recent Labs  Lab 04/15/19 2049 04/15/19 2053 04/15/19 2351 04/16/19 0554 04/17/19 0210  WBC  --  8.2  --  9.9 9.0  CREATININE  --  2.33*  --  2.20* 2.30*  LATICACIDVEN 1.7  --  1.3  --   --     Estimated Creatinine Clearance: 27.5 mL/min (A) (by C-G formula based on SCr of 2.3 mg/dL (H)).    Allergies  Allergen Reactions  . Amlodipine Swelling    Ankle swelling  . Atorvastatin Other (See Comments)    Myalgias   . Crestor [Rosuvastatin Calcium] Other (See Comments)    Stiffness and back pain  . Ezetimibe-Simvastatin Other (See Comments)    Leg cramps  . Statins Other (See Comments)    Myalgias  . Celebrex [Celecoxib] Rash  . Levemir [Insulin Detemir] Rash  . Penicillins Other (See Comments)    SYNCOPE (Tolerated Ancef) Has patient had a PCN reaction causing immediate rash, facial/tongue/throat swelling, SOB or lightheadedness with hypotension: Yes Has  patient had a PCN reaction causing severe rash involving mucus membranes or skin necrosis: No Has patient had a PCN reaction that required hospitalization No Has patient had a PCN reaction occurring within the last 10 years: No If all of the above answers are "NO", then may proceed with Cephalosporin use.    Antimicrobials this admission: 9/1 cefepime>>9/1 9/1 vanc >>9/2 9/1 Rocephin 9/2 9/1 Flagyl>>9/2   Microbiology results: 8/31 Bcx: Enterococcus 2/2, vanc sensitive 8/31 Ucx: NG/final 9/2 Bcx X 2: pending  Thank you for allowing pharmacy to be a part of this patient's care.  Gillermina Hu, PharmD, BCPS, [facility] Clinical Pharmacist

## 2019-04-17 NOTE — Patient Outreach (Signed)
[facility]) Care Management  04/17/2019  DARCUS EDDS 1950-11-30 [id number]    [facility] notified by Hospital liaison that the patient was admitted to the hospital for Weakness on 04/15/19.  Plan:  [facility] will close the program at this time due to admission.  Will send closure letter to the physician.  Lazaro Arms RN, BSN, [place] Direct Dial:  [phone]  Fax: [phone]

## 2019-04-17 NOTE — Progress Notes (Signed)
PROGRESS NOTE  [name] NFA:[id number] DOB: [birth year] DOA: 04/15/2019 PCP: [name], DO  HPI/Recap of past 24 hours: [name] is an 68 y.o. female with medical history significant ofatrial fibrillation, chronic kidney disease stage III, morbid obesity, GERD with esophageal stricture, hyperlipidemia, hypertension, IBS, osteoporosis who presented with significant weakness which is global. Patient said she has gradually felt weak unable to move around. She was on her bedside commode at home when leaned forward on her bed and slipped to the floor. She could not get up from the floor.   On presentation she was found to have right foot diabetic ulcer which she noted since February 2020.  04/17/19: Patient was seen and examined on her bedside this morning.  Reports significant pain in her right heel at the site of her diabetic foot ulcer.  Seen by wound care specialist with recommendations.  Highly appreciated.  Positive blood cultures x2. -enterococcus.  Reported allergy to penicillin.     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 disorder (HCC)   History of gastric bypass   Morbid obesity ([place])   Hypertension   Atrial fibrillation (HCC)   S/P TAVR (transcatheter aortic valve replacement)   SIRS (systemic inflammatory response syndrome) (HCC)   Pressure injury of skin   Infected wound  Generalized weakness/lethargy likely in the setting of active infective process, gram-positive bacteremia and right heel diabetic ulcer Presented with significant weakness, found to have right heel diabetic ulcer T-max 99.2 this morning  Enterococcus bacteremia Positive blood cultures collected on 04/15/2019 from both aerobic and anaerobic bottles grew gram-positive cocci 2/2.- Enterococcus Reported  allergy to penicillin Continue broad spectrum IV antibiotics until ID and sensitivities return Monitor fever curve and WBC Repeat blood cultures peripherally x2  Right foot diabetic ulcer, POA Wound care specialist consulted Appreciate recommendations  x-ray no acute osseous abnormalities Optimize pain control  Type 2 diabetes with hyperglycemia Hemoglobin A1c greater than 11 Continue insulin sliding scale  Paroxysmal A. Fib Rate controlled Continue Eliquis for CVA prevention Continue to closely monitor on telemetry  Status post TAVR No acute issues Follows with Dr. Aundra Dubin  Chronic diastolic CHF Continue strict I's and O's and daily weights   DVT prophylaxis: eliquis Code Status: Full Code.  Family Communication:  None at bedside. Disposition Plan:  Possible home in 48 to 72 hours or when symptomatology has improved.    Objective: Vitals:   04/17/19 0014 04/17/19 0030 04/17/19 0449 04/17/19 1119  BP: (!) 116/47  114/89 (!) 137/47  Pulse: 87  86 94  Resp:    18  Temp:   99.2 F (37.3 C)   TempSrc:   Oral   SpO2: (!) 88% 95% 95% 92%  Weight:      Height:        Intake/Output Summary (Last 24 hours) at 04/17/2019 1354 Last data filed at 04/17/2019 1058 Gross per 24 hour  Intake 375 ml  Output -  Net 375 ml   Filed Weights   04/15/19 2353 04/16/19 0229  Weight: 97.1 kg 93.5 kg    Exam:  . General: 68 y.o. year-old female well developed well nourished in no acute distress.  Alert and oriented x3. . Cardiovascular: Regular rate and rhythm with no rubs or gallops.  No thyromegaly or JVD noted.   Marland Kitchen  Respiratory: Clear to auscultation with no wheezes or rales. Good inspiratory effort. . Abdomen: Soft obese nontender nondistended with normal bowel sounds x4 quadrants. . Musculoskeletal: Trace lower extremity edema. 2/4 pulses in all 4 extremities. . Skin: Right heel with diabetic ulcer. Marland Kitchen Psychiatry: Mood is appropriate for condition and setting   Data  Reviewed: CBC: Recent Labs  Lab 04/15/19 2053 04/16/19 0554 04/17/19 0210  WBC 8.2 9.9 9.0  NEUTROABS 6.6  --   --   HGB 14.5 13.5 12.0  HCT 44.7 41.6 36.5  MCV 86.0 84.9 85.5  PLT 189 188 063   Basic Metabolic Panel: Recent Labs  Lab 04/15/19 2053 04/16/19 0554 04/17/19 0210  NA 137 141 139  K 3.7 4.1 3.9  CL 95* 105 103  CO2 27 24 26   GLUCOSE 249* 315* 132*  BUN 38* 37* 40*  CREATININE 2.33* 2.20* 2.30*  CALCIUM 8.8* 8.5* 8.4*   GFR: Estimated Creatinine Clearance: 27.5 mL/min (A) (by C-G formula based on SCr of 2.3 mg/dL (H)). Liver Function Tests: Recent Labs  Lab 04/15/19 2053 04/16/19 0554  AST 19 19  ALT 18 17  ALKPHOS 82 74  BILITOT 0.7 0.9  PROT 6.6 6.1*  ALBUMIN 3.3* 3.0*   Recent Labs  Lab 04/15/19 2053  LIPASE 44   No results for input(s): AMMONIA in the last 168 hours. Coagulation Profile: No results for input(s): INR, PROTIME in the last 168 hours. Cardiac Enzymes: No results for input(s): CKTOTAL, CKMB, CKMBINDEX, TROPONINI in the last 168 hours. BNP (last 3 results) No results for input(s): PROBNP in the last 8760 hours. HbA1C: Recent Labs    04/16/19 0054  HGBA1C 11.0*   CBG: Recent Labs  Lab 04/16/19 2047 04/16/19 2351 04/17/19 0507 04/17/19 0801 04/17/19 1214  GLUCAP 258* 154* 132* 150* 206*   Lipid Profile: No results for input(s): CHOL, HDL, LDLCALC, TRIG, CHOLHDL, LDLDIRECT in the last 72 hours. Thyroid Function Tests: No results for input(s): TSH, T4TOTAL, FREET4, T3FREE, THYROIDAB in the last 72 hours. Anemia Panel: No results for input(s): VITAMINB12, FOLATE, FERRITIN, TIBC, IRON, RETICCTPCT in the last 72 hours. Urine analysis:    Component Value Date/Time   COLORURINE YELLOW 04/15/2019 2310   APPEARANCEUR CLEAR 04/15/2019 2310   APPEARANCEUR Clear 06/19/2018 1637   LABSPEC 1.012 04/15/2019 2310   PHURINE 7.0 04/15/2019 2310   GLUCOSEU >=500 (A) 04/15/2019 2310   HGBUR NEGATIVE 04/15/2019 2310    BILIRUBINUR NEGATIVE 04/15/2019 2310   BILIRUBINUR Negative 06/19/2018 [place] 04/15/2019 2310   PROTEINUR 30 (A) 04/15/2019 2310   UROBILINOGEN 1.0 11/07/2014 2242   NITRITE NEGATIVE 04/15/2019 2310   LEUKOCYTESUR NEGATIVE 04/15/2019 2310   Sepsis Labs: @LABRCNTIP (procalcitonin:4,lacticidven:4)  ) Recent Results (from the past 240 hour(s))  Blood Culture (routine x 2)     Status: None (Preliminary result)   Collection Time: 04/15/19  9:07 PM   Specimen: BLOOD  Result Value Ref Range Status   Specimen Description BLOOD RIGHT ANTECUBITAL  Final   Special Requests   Final    BOTTLES DRAWN AEROBIC AND ANAEROBIC Blood Culture results may not be optimal due to an inadequate volume of blood received in culture bottles   Culture  Setup Time   Final    GRAM POSITIVE COCCI IN BOTH AEROBIC AND ANAEROBIC BOTTLES CRITICAL RESULT CALLED TO, READ BACK BY AND VERIFIED WITH: Rondel Oh PharmD 11:10 04/17/19 ([email or web address]) Performed at [facility], [place] [address] [place], [place] [address]  Culture GRAM POSITIVE COCCI  Final   Report Status PENDING  Incomplete  Blood Culture ID Panel (Reflexed)     Status: Abnormal   Collection Time: 04/15/19  9:07 PM  Result Value Ref Range Status   Enterococcus species DETECTED (A) NOT DETECTED Final    Comment: CRITICAL RESULT CALLED TO, READ BACK BY AND VERIFIED WITH: Rondel Oh PharmD 11:10 04/17/19 ([email or web address])    Vancomycin resistance NOT DETECTED NOT DETECTED Final   Listeria monocytogenes NOT DETECTED NOT DETECTED Final   Staphylococcus species NOT DETECTED NOT DETECTED Final   Staphylococcus aureus (BCID) NOT DETECTED NOT DETECTED Final   Streptococcus species NOT DETECTED NOT DETECTED Final   Streptococcus agalactiae NOT DETECTED NOT DETECTED Final   Streptococcus pneumoniae NOT DETECTED NOT DETECTED Final   Streptococcus pyogenes NOT DETECTED NOT DETECTED Final   Acinetobacter baumannii NOT DETECTED NOT DETECTED Final    Enterobacteriaceae species NOT DETECTED NOT DETECTED Final   Enterobacter cloacae complex NOT DETECTED NOT DETECTED Final   Escherichia coli NOT DETECTED NOT DETECTED Final   Klebsiella oxytoca NOT DETECTED NOT DETECTED Final   Klebsiella pneumoniae NOT DETECTED NOT DETECTED Final   Proteus species NOT DETECTED NOT DETECTED Final   Serratia marcescens NOT DETECTED NOT DETECTED Final   Haemophilus influenzae NOT DETECTED NOT DETECTED Final   Neisseria meningitidis NOT DETECTED NOT DETECTED Final   Pseudomonas aeruginosa NOT DETECTED NOT DETECTED Final   Candida albicans NOT DETECTED NOT DETECTED Final   Candida glabrata NOT DETECTED NOT DETECTED Final   Candida krusei NOT DETECTED NOT DETECTED Final   Candida parapsilosis NOT DETECTED NOT DETECTED Final   Candida tropicalis NOT DETECTED NOT DETECTED Final    Comment: Performed at [facility], [address], [place], [place] [address]  Blood Culture (routine x 2)     Status: None (Preliminary result)   Collection Time: 04/15/19  9:08 PM   Specimen: BLOOD  Result Value Ref Range Status   Specimen Description BLOOD RIGHT WRIST  Final   Special Requests   Final    BOTTLES DRAWN AEROBIC AND ANAEROBIC Blood Culture results may not be optimal due to an inadequate volume of blood received in culture bottles   Culture  Setup Time   Final    GRAM POSITIVE COCCI IN BOTH AEROBIC AND ANAEROBIC BOTTLES CRITICAL VALUE NOTED.  VALUE IS CONSISTENT WITH PREVIOUSLY REPORTED AND CALLED VALUE. Performed at [facility], [place] [address] [place], [place] [address]    Culture GRAM POSITIVE COCCI  Final   Report Status PENDING  Incomplete  Urine culture     Status: None   Collection Time: 04/15/19 11:11 PM   Specimen: In/Out Cath Urine  Result Value Ref Range Status   Specimen Description IN/OUT CATH URINE  Final   Special Requests NONE  Final   Culture   Final    NO GROWTH Performed at [facility], [place] [address], [place],  [place] [address]    Report Status 04/17/2019 FINAL  Final  SARS Coronavirus 2 [facility] order, Performed in [facility] hospital lab) Nasopharyngeal Nasopharyngeal Swab     Status: None   Collection Time: 04/15/19 11:52 PM   Specimen: Nasopharyngeal Swab  Result Value Ref Range Status   SARS Coronavirus 2 NEGATIVE NEGATIVE Final    Comment: (NOTE) If result is NEGATIVE SARS-CoV-2 target nucleic acids are NOT DETECTED. The SARS-CoV-2 RNA is generally detectable in upper and lower  respiratory specimens during the acute phase  of infection. The lowest  concentration of SARS-CoV-2 viral copies this assay can detect is 250  copies / mL. A negative result does not preclude SARS-CoV-2 infection  and should not be used as the sole basis for treatment or other  patient management decisions.  A negative result may occur with  improper specimen collection / handling, submission of specimen other  than nasopharyngeal swab, presence of viral mutation(s) within the  areas targeted by this assay, and inadequate number of viral copies  (<250 copies / mL). A negative result must be combined with clinical  observations, patient history, and epidemiological information. If result is POSITIVE SARS-CoV-2 target nucleic acids are DETECTED. The SARS-CoV-2 RNA is generally detectable in upper and lower  respiratory specimens dur ing the acute phase of infection.  Positive  results are indicative of active infection with SARS-CoV-2.  Clinical  correlation with patient history and other diagnostic information is  necessary to determine patient infection status.  Positive results do  not rule out bacterial infection or co-infection with other viruses. If result is PRESUMPTIVE POSTIVE SARS-CoV-2 nucleic acids MAY BE PRESENT.   A presumptive positive result was obtained on the submitted specimen  and confirmed on repeat testing.  While 2019 novel coronavirus  (SARS-CoV-2) nucleic acids may be present in the submitted  sample  additional confirmatory testing may be necessary for epidemiological  and / or clinical management purposes  to differentiate between  SARS-CoV-2 and other Sarbecovirus currently known to infect humans.  If clinically indicated additional testing with an alternate test  methodology [phone]) is advised. The SARS-CoV-2 RNA is generally  detectable in upper and lower respiratory sp ecimens during the acute  phase of infection. The expected result is Negative. Fact Sheet for Patients:  StrictlyIdeas.no Fact Sheet for Healthcare Providers: BankingDealers.co.za This test is not yet approved or cleared by the [place] FDA and has been authorized for detection and/or diagnosis of SARS-CoV-2 by FDA under an Emergency Use Authorization (EUA).  This EUA will remain in effect (meaning this test can be used) for the duration of the COVID-19 declaration under Section 564(b)(1) of the Act, 21 U.S.C. section 360bbb-3(b)(1), unless the authorization is terminated or revoked sooner. Performed at [facility], [place] [address] [place], [place] [address]       Studies: No results found.  Scheduled Meds: . apixaban  5 mg Oral BID  . calcium-vitamin D  1 tablet Oral 3 times weekly  . carvedilol  25 mg Oral BID  . cholecalciferol  2,000 Units Oral Daily  . fenofibrate  54 mg Oral Daily  . hydrALAZINE  10 mg Oral Q6H  . insulin aspart  0-15 Units Subcutaneous TID WC  . insulin aspart  0-5 Units Subcutaneous QHS  . insulin aspart protamine- aspart  75 Units Subcutaneous BID WC  . mupirocin ointment  1 application Topical BID  . pravastatin  40 mg Oral q1800  . prenatal vitamin w/FE, FA  1 tablet Oral 3 times weekly  . torsemide  20 mg Oral BID  . vitamin E  400 Units Oral 3 times weekly    Continuous Infusions: . cefTRIAXone (ROCEPHIN)  IV 2 g (04/17/19 1246)   And  . metronidazole 500 mg (04/17/19 1133)  . vancomycin 2,000 mg  (04/17/19 1344)  . [START ON 04/19/2019] vancomycin       LOS: 1 day     Kayleen Memos, MD [facility] Pager [phone]  If 7PM-7AM, please contact night-coverage [email or web address]  Password [email or web address] 04/17/2019, 1:54 PM

## 2019-04-17 NOTE — Progress Notes (Signed)
Penicillin Allergy Assessment  [name] is noted to have a penicillin allergy of syncope listed in her medical chart. I personally performed a penicillin allergy review with the patient this afternoon.   Patient history of penicillin or beta lactam allergy:  The patient remembers receiving a shot of penicillin when she was about 28-68 years of age. She said she got lightheaded and a little nauseous and had to lay down immediately after. However, when asked if she has ever had this reaction to any other shot she reports she does not like shots and has gotten lightheaded in the past when getting them. She also says she has received Keflex in the past after this reaction to penicillin and tolerated it with no issues.   I explained to [name] that 80-100% of patients outgrow their penicillin allergy 10 years after the reported reaction. Therefore any allergic reaction she may have had to penicillin has more than likely gone away since it has been over 50 years since the syncopal episode.  I also explained the importance of using penicillin based antibiotics and that they are our standard of care in patients when they have infections. She is agreeable to try the oral challenge with amoxicillin this afternoon.  Plan - Amoxicillin 500 mg X 1 this afternoon - Monitor vital signs every 15 minutes for 1 hour after administration  - If no reaction, remove penicillin allergy from chart  - PharmD will call patient's primary pharmacy and physician office to follow-up on allergy after oral challenge  Nicoletta Dress, PharmD PGY2 Infectious Disease Pharmacy Resident  [phone]

## 2019-04-17 NOTE — Progress Notes (Signed)
Pharmacy Antibiotic Note  [name] is a 68 y.o. female admitted on 04/15/2019 with weakness. She was found to have a suspected foot infection for which she is being treated empirically with ceftriaxone and metronidazole. Today, she has 2/2 blood cultures positive for Enterococcus sensitive to vancomycin. Since patient has penicillin allergy, pharmacy has been consulted for vancomycin dosing. WBC WNL. Tmax this admission 100.4.   Plan: Give vancomycin loading dose 2g IV x1. Schedule vancomycin 1g IV q48hr. Estimated AUC 459. Goal 400-550. Scr used 2.3.  Continue ceftriaxone and metronidazole. Monitor CBC, renal function, clinical progress, and vancomycin levels as needed.   Height: 5\' 7"  (170.2 cm) Weight: 206 lb 2.1 oz (93.5 kg) IBW/kg (Calculated) : 61.6  Temp (24hrs), Avg:99.1 F (37.3 C), Min:98.7 F (37.1 C), Max:99.4 F (37.4 C)  Recent Labs  Lab 04/15/19 2049 04/15/19 2053 04/15/19 2351 04/16/19 0554 04/17/19 0210  WBC  --  8.2  --  9.9 9.0  CREATININE  --  2.33*  --  2.20* 2.30*  LATICACIDVEN 1.7  --  1.3  --   --     Estimated Creatinine Clearance: 27.5 mL/min (A) (by C-G formula based on SCr of 2.3 mg/dL (H)).    Allergies  Allergen Reactions  . Amlodipine Swelling    Ankle swelling  . Atorvastatin Other (See Comments)    Myalgias   . Crestor [Rosuvastatin Calcium] Other (See Comments)    Stiffness and back pain  . Ezetimibe-Simvastatin Other (See Comments)    Leg cramps  . Statins Other (See Comments)    Myalgias  . Celebrex [Celecoxib] Rash  . Levemir [Insulin Detemir] Rash  . Penicillins Other (See Comments)    SYNCOPE (Tolerated Ancef) Has patient had a PCN reaction causing immediate rash, facial/tongue/throat swelling, SOB or lightheadedness with hypotension: Yes Has patient had a PCN reaction causing severe rash involving mucus membranes or skin necrosis: No Has patient had a PCN reaction that required hospitalization No Has patient had a PCN  reaction occurring within the last 10 years: No If all of the above answers are "NO", then may proceed with Cephalosporin use.    Antimicrobials this admission: 9/1 cefepime>>9/1 9/1 vanc >>9/1 9/1 Rocephin >> 9/1 Flagyl>> 9/2 Vanc>>  Dose adjustments this admission:   Microbiology results: 8/31 Bcx: Enterococcus 2/2, vanc sensitive 8/31 Ucx: NGF  Thank you for allowing pharmacy to be a part of this patient's care.  Agnes Lawrence, PharmD PGY1 Pharmacy Resident

## 2019-04-17 NOTE — Consult Note (Addendum)
[facility]       Reason for Consult: Enterococcal bacteremia    Referring Physician: Irene Pap, MD  Principal Problem:   Weakness generalized Active Problems:   Coronary artery disease, non-occlusive   CKD stage 3 due to type 2 diabetes mellitus ([place])   Type II diabetes mellitus with peripheral circulatory disorder (HCC)   History of gastric bypass   Morbid obesity ([place])   Hypertension   Atrial fibrillation (HCC)   S/P TAVR (transcatheter aortic valve replacement)   SIRS (systemic inflammatory response syndrome) (HCC)   Pressure injury of skin   Infected wound   . amoxicillin  500 mg Oral Once  . apixaban  5 mg Oral BID  . calcium-vitamin D  1 tablet Oral 3 times weekly  . carvedilol  25 mg Oral BID  . cholecalciferol  2,000 Units Oral Daily  . fenofibrate  54 mg Oral Daily  . hydrALAZINE  10 mg Oral Q6H  . insulin aspart  0-15 Units Subcutaneous TID WC  . insulin aspart  0-5 Units Subcutaneous QHS  . insulin aspart protamine- aspart  75 Units Subcutaneous BID WC  . mupirocin ointment  1 application Topical BID  . nutrition supplement (JUVEN)  1 packet Oral BID BM  . pravastatin  40 mg Oral q1800  . prenatal vitamin w/FE, FA  1 tablet Oral 3 times weekly  . Ensure Max Protein  11 oz Oral QHS  . torsemide  20 mg Oral BID  . vitamin E  400 Units Oral 3 times weekly    Recommendations: 1. Enterococcal sepsis -both of the patient's initial blood cultures are now positive for Enterococcus faecalis.  She was empirically started on vancomycin, rocephin, and Flagyl.  She has a reported allergy to penicillin which caused nausea and potentially emesis when she received a IM penicillin injection many years ago.  As her enterococcus is likely ampicillin sensitive, this would be the preferred treatment, so we will plan to give her a test dose of oral penicillin this evening with the plan that if she tolerates this to transition to IV Unasyn instead.   Repeat blood cultures x2 to establish clearance of her bacteremia.  Given her history of a TAVR, would proceed directly to transesophageal echocardiogram to best evaluate her TAVR apparatus and to rule out endocarditis.  Given the chronic nature of her right heel wound and poorly controlled diabetes, I am concerned that she is at rather high risk for subacute endocarditis.  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  It is unclear why she did not seek medical attention for this wound earlier.  Based on conversations with the patient it appears to be multifactorial as the COVID pandemic made access to medical care more challenging, she appears to have an element of peripheral neuropathy perhaps from her gastric bypass history and subsequent vitamin deficiencies, and overall neglect/denial of her medical comorbidities and infectious risks.  Baseline CRP here is 10.2. De-escalation of vanc/rocephin/flagyl to unasyn should be adequate. Would advise proceeding with an MRI of his RT ankle to evaluate for osteomyelitis. If this is confirmed, would consult orthopedics for debridement vs. Amputation as calcaneal osteomyelitis is notorious to fail efforts to medically treat even with prolonged IV ABX.  3. CRI - The patient's Cr baseline is unclear as she has ranged from 1.78 to 3.21 within the past 9  months, mostly with a trend of worsening in recent months. Her Cr today is 2.30, more consistent with stage IV renal disease. Difficult to say if this is true AoCRF though, particularly as her DM has been mostly poorly controlled with ambulatory blood sugars in the mid 200s-low 300s in recent months per the patient.  I anticipate the patient will need some sort of long-term IV access for parenteral antibiotics, but the duration of this treatment is unknown at this time.  I worry she may need a PICC line if right heel osteomyelitis  is confirmed as this often requires 8 weeks of IV antibiotic therapy to properly treat.  I would have a low threshold for consulting nephrology to follow along in this medically tenuous patient from a renal standpoint.  Assessment: Patient is a 68 year old morbidly obese white female status post gastric bypass with stage III chronic kidney disease, h/o severe aortic stenosis, s/p TAVR and a chronic right heel ulcer admitted with malaise and subsequently found to have Enterococcal bacteremia.  Antibiotics: Vancomycin, day 1 Rocephin, day 2 Flagyl, day 2  HPI: [name] is a 68 y.o. morbidly obese white female, s/p gastric bypass, h/o severe aortic stenosis, s/p TAVR with stage III chronic kidney disease admitted on 04/15/2019 for weakness/malaise.  She has had a right ankle/heel wound since February of this year.  Only within the last 6 to 8 weeks did the patient seek any medical attention for this, unfortunately.  She has multiple explanations for this delay in care including the COVID pandemic, lack of pain and sensation to the wound until the last 1 week, and depression/lack of concern regarding her overall health.  Over the past several months the patient has consistently had blood sugars in the mid 200s to lower 300s range.  She blames the elevation on "forgetting when to check her sugars" and frequently checking after she has eaten.  Only within the last 4 to 6 weeks as the patient sought care from a local wound care physician for her right heel and ankle.  She does not recall having any cultures obtained to her wound but does state that she did have some debridements and local wound care with what sounds like Medi-honey and Betadine soaked bandages placed to this wound.  Upon admission here the patient had a temperature of 100.4 and initial blood cultures are now positive for Enterococcus faecalis.  Of note, the patient does have a history of a TAVR performed several years ago due to severe  aortic stenosis.  She was empirically placed on vancomycin Rocephin and Flagyl prior to my assessment.  Regarding the location of her wound, the patient is rather evasive in answering questions as to her ambulatory status and whether her wound represents a decubital ulcer or more worrisome a vascular ulcer to her leg.  Due to her chronic renal insufficiency contrasted studies for evaluation of peripheral arterial and vascular disease have been postponed or deferred. Fever curve, WBC and CR trends, cx results, imaging and ABX usage all independently reviewed  Review of Systems:  Review of Systems  Constitutional: Negative for chills, fever and weight loss.  HENT: Negative for congestion, hearing loss, sinus pain and sore throat.   Eyes: Negative for blurred vision, photophobia and discharge.  Respiratory: Negative for cough, hemoptysis and shortness of breath.   Cardiovascular: Negative for chest pain, palpitations, orthopnea and leg swelling.  Gastrointestinal: Negative for abdominal pain, constipation, diarrhea, heartburn, nausea and vomiting.  Genitourinary: Negative for dysuria, flank  pain, frequency and urgency.  Musculoskeletal: Positive for joint pain. Negative for back pain and myalgias.       RT ankle  Skin: Negative for itching and rash.  Neurological: Negative for tremors, seizures, weakness and headaches.  Endo/Heme/Allergies: Negative for polydipsia. Does not bruise/bleed easily.  Psychiatric/Behavioral: Negative for depression and substance abuse. The patient is not nervous/anxious and does not have insomnia.      All other systems reviewed and are negative    Past Medical History:  Diagnosis Date  . Atrial fibrillation ([place])    a. maintaining sinus s/p DCCV, on Eliquis  . CAD (coronary artery disease)    a. Nonobstructive by cath 2006 - 25% LAD, 25-30% after 1st diag, distal 25% PDA, minor irreg LCx. b. Normal nuc 2011.  . CKD (chronic kidney disease), stage III ([place])   .  Complication of anesthesia   . Cystocele   . Difficult intubation 09-07-2012   big neck trouble with intubation parotid surgery"takes little med to sedate"  . Esophageal stricture   . GERD (gastroesophageal reflux disease)   . Hiatal hernia    a. 07/2013: HH with small stricture holding up barium tablet (concides with patient's sx of food sticking).  . History of kidney stones   . Hyperlipidemia   . Hypertension   . IBS (irritable bowel syndrome)   . Memory difficulty 11/26/2015  . Morbid obesity ([place])    a. hx of gastric bypass (sleeve gastrectomy 2015)  . Osteoporosis   . 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  [place] [facility] Pericardial effusion    a. Mod by echo 2012 at time of PNA. b. Echo 07/2012: small pericardial effusion vs fat.  . Pressure ulcer 04/16/2019   right foot  . S/P TAVR (transcatheter aortic valve replacement)   . Severe aortic stenosis   . Sleep apnea     "have mask; don't use it" (02/14/2018)  . TIA (transient ischemic attack) 10/2014; 06/2016   "some memory issues since; daughter says my speech is sometimes different" (02/14/2018)  . Type II diabetes mellitus (HCC)     Social History   Tobacco Use  . Smoking status: Never Smoker  . Smokeless tobacco: Never Used  Substance Use Topics  . Alcohol use: Yes    Alcohol/week: 0.0 standard drinks    Comment:  "mixed drink q few years"  . Drug use: Never    Family History  Problem Relation Age of Onset  . Heart failure Mother   . Hypertension Mother   . Skin cancer Mother   . Colitis Mother   . Diabetes Mother   . Kidney Stones Mother   . Stroke Mother   . Heart disease Father   . Colon polyps Father   . Diabetes Father   . Heart failure Father   . Heart attack Sister   . Diabetes Other   . Stroke Maternal Grandfather   . Diabetes Maternal Grandfather   . Colon cancer Neg Hx      Current Facility-Administered Medications:  .  acetaminophen (TYLENOL) tablet 650 mg, 650 mg, Oral, Q6H PRN,  Irene Pap N, DO, 650 mg at 04/17/19 1456 .  amoxicillin (AMOXIL) capsule 500 mg, 500 mg, Oral, Once, Dixon, Melton Krebs, NP .  apixaban (ELIQUIS) tablet 5 mg, 5 mg, Oral, BID, Jonelle Sidle, Mohammad L, MD, 5 mg at 04/17/19 1120 .  calcium-vitamin D (OSCAL WITH D) 500-200 MG-UNIT per tablet 1 tablet, 1 tablet, Oral, 3 times weekly, Jonelle Sidle,  Henderson Newcomer, MD, 1 tablet at 04/17/19 [phone] .  carvedilol (COREG) tablet 25 mg, 25 mg, Oral, BID, [place], Mohammad L, MD, 25 mg at 04/17/19 1120 .  cefTRIAXone (ROCEPHIN) 2 g in sodium chloride 0.9 % 100 mL IVPB, 2 g, Intravenous, Q24H, Last Rate: 200 mL/hr at 04/17/19 1246, 2 g at 04/17/19 1246 **AND** metroNIDAZOLE (FLAGYL) IVPB 500 mg, 500 mg, Intravenous, Q8H, Vann, Jessica U, DO, Last Rate: 100 mL/hr at 04/17/19 1133, 500 mg at 04/17/19 1133 .  cholecalciferol (VITAMIN D3) tablet 2,000 Units, 2,000 Units, Oral, Daily, Elwyn Reach, MD, 2,000 Units at 04/17/19 1119 .  fenofibrate tablet 54 mg, 54 mg, Oral, Daily, [place], Mohammad L, MD, 54 mg at 04/17/19 1119 .  hydrALAZINE (APRESOLINE) tablet 10 mg, 10 mg, Oral, Q6H, [place], Mohammad L, MD, 10 mg at 04/17/19 1120 .  insulin aspart (novoLOG) injection 0-15 Units, 0-15 Units, Subcutaneous, TID WC, Elwyn Reach, MD, 5 Units at 04/17/19 1248 .  insulin aspart (novoLOG) injection 0-5 Units, 0-5 Units, Subcutaneous, QHS, Elwyn Reach, MD, 3 Units at 04/16/19 2228 .  insulin aspart protamine- aspart (NOVOLOG MIX 70/30) injection 75 Units, 75 Units, Subcutaneous, BID WC, Elwyn Reach, MD, 75 Units at 04/17/19 0825 .  meclizine (ANTIVERT) tablet 12.5 mg, 12.5 mg, Oral, TID PRN, Jonelle Sidle, Mohammad L, MD .  mupirocin ointment (BACTROBAN) 2 % 1 application, 1 application, Topical, BID, Elwyn Reach, MD, 1 application at 45/80/99 1129 .  nutrition supplement (JUVEN) (JUVEN) powder packet 1 packet, 1 packet, Oral, BID BM, Hall, Carole N, DO .  ondansetron (ZOFRAN) tablet 4 mg, 4 mg, Oral, Q6H PRN **OR**  ondansetron (ZOFRAN) injection 4 mg, 4 mg, Intravenous, Q6H PRN, [place], Mohammad L, MD .  polyvinyl alcohol (LIQUIFILM TEARS) 1.4 % ophthalmic solution 1 drop, 1 drop, Both Eyes, Daily PRN, Jonelle Sidle, Mohammad L, MD .  pravastatin (PRAVACHOL) tablet 40 mg, 40 mg, Oral, q1800, Gala Romney L, MD, 40 mg at 04/16/19 1707 .  prenatal vitamin w/FE, FA (PRENATAL 1 + 1) 27-1 MG tablet 1 tablet, 1 tablet, Oral, 3 times weekly, Elwyn Reach, MD, 1 tablet at 04/17/19 1119 .  protein supplement (ENSURE MAX) liquid, 11 oz, Oral, QHS, Hall, Carole N, DO .  sodium chloride (OCEAN) 0.65 % nasal spray 1 spray, 1 spray, Each Nare, PRN, Jonelle Sidle, Mohammad L, MD .  torsemide (DEMADEX) tablet 20 mg, 20 mg, Oral, BID, Jonelle Sidle, Mohammad L, MD, 20 mg at 04/17/19 0823 .  [START ON 04/19/2019] vancomycin (VANCOCIN) IVPB 1000 mg/200 mL premix, 1,000 mg, Intravenous, Q48H, Hall, Carole N, DO .  vitamin E capsule 400 Units, 400 Units, Oral, 3 times weekly, Elwyn Reach, MD, 400 Units at 04/17/19 8338  Allergies  Allergen Reactions  . Amlodipine Swelling    Ankle swelling  . Atorvastatin Other (See Comments)    Myalgias   . Crestor [Rosuvastatin Calcium] Other (See Comments)    Stiffness and back pain  . Ezetimibe-Simvastatin Other (See Comments)    Leg cramps  . Statins Other (See Comments)    Myalgias  . Celebrex [Celecoxib] Rash  . Levemir [Insulin Detemir] Rash  . Penicillins Other (See Comments)    SYNCOPE (Tolerated Ancef) Has patient had a PCN reaction causing immediate rash, facial/tongue/throat swelling, SOB or lightheadedness with hypotension: Yes Has patient had a PCN reaction causing severe rash involving mucus membranes or skin necrosis: No Has patient had a PCN reaction that required hospitalization No Has patient had a PCN reaction  occurring within the last 10 years: No If all of the above answers are "NO", then may proceed with Cephalosporin use.    Vitals:   04/17/19 1119 04/17/19 1424  BP:  (!) 137/47 (!) 131/50  Pulse: 94 97  Resp: 18 18  Temp:  (!) 101 F (38.3 C)  SpO2: 92% 97%     Physical Exam Gen: evasive at times in answering questions, obese, NAD, A&Ox 3 Head: NCAT, no temporal wasting evident EENT: PERRL, EOMI, MMM, adequate dentition Neck: supple, no JVD CV: NRRR, I/VI diastolic murmur at LUSB appreciated Pulm: CTA bilaterally, no wheeze or retractions Abd: soft, obese, well-healed midline incision, NTND, +BS Extrems:  trace LE edema, 2+ pulses MSK: half dollar sized ulcer at RT malleolus with overlying eschar making wound unstageable, + surrounding erythema but no drainage evident Skin: chronic venous stasis to distal LEs, adequate skin turgor Neuro: CN II-XII grossly intact, no focal neurologic deficits appreciated, gait was not assessed, A&Ox 3   Lab Results  Component Value Date   WBC 9.0 04/17/2019   HGB 12.0 04/17/2019   HCT 36.5 04/17/2019   MCV 85.5 04/17/2019   PLT 167 04/17/2019    Lab Results  Component Value Date   CREATININE 2.30 (H) 04/17/2019   BUN 40 (H) 04/17/2019   NA 139 04/17/2019   K 3.9 04/17/2019   CL 103 04/17/2019   CO2 26 04/17/2019    Lab Results  Component Value Date   ALT 17 04/16/2019   AST 19 04/16/2019   ALKPHOS 74 04/16/2019     Microbiology: Recent Results (from the past 240 hour(s))  Blood Culture (routine x 2)     Status: None (Preliminary result)   Collection Time: 04/15/19  9:07 PM   Specimen: BLOOD  Result Value Ref Range Status   Specimen Description BLOOD RIGHT ANTECUBITAL  Final   Special Requests   Final    BOTTLES DRAWN AEROBIC AND ANAEROBIC Blood Culture results may not be optimal due to an inadequate volume of blood received in culture bottles   Culture  Setup Time   Final    GRAM POSITIVE COCCI IN BOTH AEROBIC AND ANAEROBIC BOTTLES CRITICAL RESULT CALLED TO, READ BACK BY AND VERIFIED WITH: Rondel Oh PharmD 11:10 04/17/19 ([email or web address]) Performed at [facility], [address],  [place], [place] [address]    Culture GRAM POSITIVE COCCI  Final   Report Status PENDING  Incomplete  Blood Culture ID Panel (Reflexed)     Status: Abnormal   Collection Time: 04/15/19  9:07 PM  Result Value Ref Range Status   Enterococcus species DETECTED (A) NOT DETECTED Final    Comment: CRITICAL RESULT CALLED TO, READ BACK BY AND VERIFIED WITH: Rondel Oh PharmD 11:10 04/17/19 ([email or web address])    Vancomycin resistance NOT DETECTED NOT DETECTED Final   Listeria monocytogenes NOT DETECTED NOT DETECTED Final   Staphylococcus species NOT DETECTED NOT DETECTED Final   Staphylococcus aureus (BCID) NOT DETECTED NOT DETECTED Final   Streptococcus species NOT DETECTED NOT DETECTED Final   Streptococcus agalactiae NOT DETECTED NOT DETECTED Final   Streptococcus pneumoniae NOT DETECTED NOT DETECTED Final   Streptococcus pyogenes NOT DETECTED NOT DETECTED Final   Acinetobacter baumannii NOT DETECTED NOT DETECTED Final   Enterobacteriaceae species NOT DETECTED NOT DETECTED Final   Enterobacter cloacae complex NOT DETECTED NOT DETECTED Final   Escherichia coli NOT DETECTED NOT DETECTED Final   Klebsiella oxytoca NOT DETECTED NOT DETECTED Final   Klebsiella pneumoniae NOT DETECTED  NOT DETECTED Final   Proteus species NOT DETECTED NOT DETECTED Final   Serratia marcescens NOT DETECTED NOT DETECTED Final   Haemophilus influenzae NOT DETECTED NOT DETECTED Final   Neisseria meningitidis NOT DETECTED NOT DETECTED Final   Pseudomonas aeruginosa NOT DETECTED NOT DETECTED Final   Candida albicans NOT DETECTED NOT DETECTED Final   Candida glabrata NOT DETECTED NOT DETECTED Final   Candida krusei NOT DETECTED NOT DETECTED Final   Candida parapsilosis NOT DETECTED NOT DETECTED Final   Candida tropicalis NOT DETECTED NOT DETECTED Final    Comment: Performed at [facility], [place] [address] [place], [place] [address]  Blood Culture (routine x 2)     Status: None (Preliminary result)   Collection Time: 04/15/19   9:08 PM   Specimen: BLOOD  Result Value Ref Range Status   Specimen Description BLOOD RIGHT WRIST  Final   Special Requests   Final    BOTTLES DRAWN AEROBIC AND ANAEROBIC Blood Culture results may not be optimal due to an inadequate volume of blood received in culture bottles   Culture  Setup Time   Final    GRAM POSITIVE COCCI IN BOTH AEROBIC AND ANAEROBIC BOTTLES CRITICAL VALUE NOTED.  VALUE IS CONSISTENT WITH PREVIOUSLY REPORTED AND CALLED VALUE. Performed at [facility], [place] [address], [place], [place] [address]    Culture GRAM POSITIVE COCCI  Final   Report Status PENDING  Incomplete  Urine culture     Status: None   Collection Time: 04/15/19 11:11 PM   Specimen: In/Out Cath Urine  Result Value Ref Range Status   Specimen Description IN/OUT CATH URINE  Final   Special Requests NONE  Final   Culture   Final    NO GROWTH Performed at [facility], [place] [address] [place], [place] [address]    Report Status 04/17/2019 FINAL  Final  SARS Coronavirus 2 [facility] order, Performed in [facility] hospital lab) Nasopharyngeal Nasopharyngeal Swab     Status: None   Collection Time: 04/15/19 11:52 PM   Specimen: Nasopharyngeal Swab  Result Value Ref Range Status   SARS Coronavirus 2 NEGATIVE NEGATIVE Final    Comment: (NOTE) If result is NEGATIVE SARS-CoV-2 target nucleic acids are NOT DETECTED. The SARS-CoV-2 RNA is generally detectable in upper and lower  respiratory specimens during the acute phase of infection. The lowest  concentration of SARS-CoV-2 viral copies this assay can detect is 250  copies / mL. A negative result does not preclude SARS-CoV-2 infection  and should not be used as the sole basis for treatment or other  patient management decisions.  A negative result may occur with  improper specimen collection / handling, submission of specimen other  than nasopharyngeal swab, presence of viral mutation(s) within the  areas targeted by this assay, and  inadequate number of viral copies  (<250 copies / mL). A negative result must be combined with clinical  observations, patient history, and epidemiological information. If result is POSITIVE SARS-CoV-2 target nucleic acids are DETECTED. The SARS-CoV-2 RNA is generally detectable in upper and lower  respiratory specimens dur ing the acute phase of infection.  Positive  results are indicative of active infection with SARS-CoV-2.  Clinical  correlation with patient history and other diagnostic information is  necessary to determine patient infection status.  Positive results do  not rule out bacterial infection or co-infection with other viruses. If result is PRESUMPTIVE POSTIVE SARS-CoV-2 nucleic acids MAY BE PRESENT.   A  presumptive positive result was obtained on the submitted specimen  and confirmed on repeat testing.  While 2019 novel coronavirus  (SARS-CoV-2) nucleic acids may be present in the submitted sample  additional confirmatory testing may be necessary for epidemiological  and / or clinical management purposes  to differentiate between  SARS-CoV-2 and other Sarbecovirus currently known to infect humans.  If clinically indicated additional testing with an alternate test  methodology [phone]) is advised. The SARS-CoV-2 RNA is generally  detectable in upper and lower respiratory sp ecimens during the acute  phase of infection. The expected result is Negative. Fact Sheet for Patients:  StrictlyIdeas.no Fact Sheet for Healthcare Providers: BankingDealers.co.za This test is not yet approved or cleared by the [place] FDA and has been authorized for detection and/or diagnosis of SARS-CoV-2 by FDA under an Emergency Use Authorization (EUA).  This EUA will remain in effect (meaning this test can be used) for the duration of the COVID-19 declaration under Section 564(b)(1) of the Act, 21 U.S.C. section 360bbb-3(b)(1), unless the  authorization is terminated or revoked sooner. Performed at [facility], [place] [address], [place], [place] [address]     Gilmore List N Ronee Ranganathan, [facility] [email or web address] 04/17/2019, 5:18 PM

## 2019-04-17 NOTE — Progress Notes (Signed)
Cleansed left heal pressure ulcer with normal saline, dressed with hydrocolloid dressing per orders. Cleansed right foot 2nd and 3rd toe with iodine per orders. Patient tolerated well. Will continue to monitor for remainder of shift.

## 2019-04-18 DIAGNOSIS — E11628 Type 2 diabetes mellitus with other skin complications: Secondary | ICD-10-CM

## 2019-04-18 DIAGNOSIS — R32 Unspecified urinary incontinence: Secondary | ICD-10-CM

## 2019-04-18 DIAGNOSIS — A4181 Sepsis due to Enterococcus: Principal | ICD-10-CM

## 2019-04-18 DIAGNOSIS — L89610 Pressure ulcer of right heel, unstageable: Secondary | ICD-10-CM

## 2019-04-18 LAB — GLUCOSE, CAPILLARY
Glucose-Capillary: 186 mg/dL — ABNORMAL HIGH (ref 70–99)
Glucose-Capillary: 377 mg/dL — ABNORMAL HIGH (ref 70–99)
Glucose-Capillary: 438 mg/dL — ABNORMAL HIGH (ref 70–99)
Glucose-Capillary: 351 mg/dL — ABNORMAL HIGH (ref 70–99)

## 2019-04-18 MED ORDER — INSULIN ASPART PROT & ASPART (70-30 MIX) 100 UNIT/ML ~~LOC~~ SUSP
80.0000 [IU] | Freq: Two times a day (BID) | SUBCUTANEOUS | Status: DC
  Administered 2019-04-18: 80 [IU] via SUBCUTANEOUS
  Filled 2019-04-18: qty 10

## 2019-04-18 MED ORDER — INSULIN ASPART 100 UNIT/ML ~~LOC~~ SOLN
0.0000 [IU] | Freq: Every day | SUBCUTANEOUS | Status: DC
  Administered 2019-04-18: 5 [IU] via SUBCUTANEOUS
  Administered 2019-04-19: 3 [IU] via SUBCUTANEOUS
  Administered 2019-04-20 – 2019-04-21 (×2): 4 [IU] via SUBCUTANEOUS
  Administered 2019-04-22: 5 [IU] via SUBCUTANEOUS
  Administered 2019-04-23: 4 [IU] via SUBCUTANEOUS
  Administered 2019-04-25: 2 [IU] via SUBCUTANEOUS

## 2019-04-18 MED ORDER — INSULIN ASPART 100 UNIT/ML ~~LOC~~ SOLN
0.0000 [IU] | Freq: Three times a day (TID) | SUBCUTANEOUS | Status: DC
  Administered 2019-04-18: 20 [IU] via SUBCUTANEOUS
  Administered 2019-04-19: 7 [IU] via SUBCUTANEOUS
  Administered 2019-04-19: 11 [IU] via SUBCUTANEOUS
  Administered 2019-04-19: 4 [IU] via SUBCUTANEOUS
  Administered 2019-04-20: 20 [IU] via SUBCUTANEOUS
  Administered 2019-04-20: 11 [IU] via SUBCUTANEOUS
  Administered 2019-04-20: 15 [IU] via SUBCUTANEOUS
  Administered 2019-04-21 – 2019-04-22 (×6): 7 [IU] via SUBCUTANEOUS
  Administered 2019-04-23: 11 [IU] via SUBCUTANEOUS
  Administered 2019-04-23: 4 [IU] via SUBCUTANEOUS
  Administered 2019-04-23: 11 [IU] via SUBCUTANEOUS
  Administered 2019-04-24: 7 [IU] via SUBCUTANEOUS
  Administered 2019-04-25 – 2019-04-26 (×2): 4 [IU] via SUBCUTANEOUS
  Administered 2019-04-26: 11 [IU] via SUBCUTANEOUS

## 2019-04-18 NOTE — Progress Notes (Signed)
PROGRESS NOTE  [name] FGH:[id number] DOB: [birth year] DOA: 04/15/2019 PCP: [name], DO  HPI/Recap of past 24 hours: [name] is an 68 y.o. female with medical history significant ofatrial fibrillation, chronic kidney disease stage III, morbid obesity, GERD with esophageal stricture, hyperlipidemia, hypertension, IBS, osteoporosis who presented with significant weakness which is global. Patient said she has gradually felt weak unable to move around. She was on her bedside commode at home when leaned forward on her bed and slipped to the floor. She could not get up from the floor.   On presentation she was found to have right foot diabetic ulcer which she noted since February 2020.  Positive blood cultures x2/2. -enterococcus.  Reported allergy to penicillin.    04/18/19: Patient seen and examined at her bedside.  She states she feels better.  Denies recurrent chills or night sweats at this time.  No cardiopulmonary symptoms.  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 disorder (HCC)   History of gastric bypass   Morbid obesity ([place])   Hypertension   Atrial fibrillation (HCC)   S/P TAVR (transcatheter aortic valve replacement)   SIRS (systemic inflammatory response syndrome) (HCC)   Pressure injury of skin   Infected wound  Generalized weakness/lethargy likely in the setting of active infective process, enterococcus bacteremia and right heel diabetic ulcer Presented with significant weakness, found to have right heel diabetic ulcer Continue to treat underlying condition  Enterococcus bacteremia, right heel diabetic ulcer likely the source Positive blood cultures collected on 04/15/2019 from both aerobic and anaerobic bottles grew gram-positive cocci 2/2.- Enterococcus  Reported allergy to penicillin Penicillin challenge completed No acute issues with Unasyn Repeated blood culture done on 04/17/2019 in process Plan for TEE ID following  Status post TAVR Plan for TEE to rule out endocarditis Continue IV antibiotics F/u With Dr. Aundra Dubin  Right foot diabetic ulcer, POA Wound care specialist consulted Appreciate recommendations  x-ray no acute osseous abnormalities Elevated CRP 10.2, low sed rate 17 on 04/16/2019 Continue IV antibiotics Optimize pain control  Type 2 diabetes with hyperglycemia Hemoglobin A1c greater than 11 Increased 7030 dose to 80 units twice daily Change insulin sliding scale  Paroxysmal A. Fib Rate controlled Continue Eliquis for CVA prevention Continue to closely monitor on telemetry  Chronic diastolic CHF Continue strict I's and O's and daily weights   DVT prophylaxis: eliquis Code Status: Full Code.  Family Communication:  None at bedside. Disposition Plan:  Possible DC to home when cardiology and infectious disease sign off.   Objective: Vitals:   04/17/19 1845 04/17/19 2100 04/18/19 0603 04/18/19 1455  BP: (!) 145/55 (!) 151/52 (!) 134/51 (!) 122/42  Pulse: 96 94 87 80  Resp: 18 18 19 18   Temp: 98.2 F (36.8 C) 98.2 F (36.8 C) 99.1 F (37.3 C) 98.1 F (36.7 C)  TempSrc: Oral Oral Oral Oral  SpO2: 90% 95% 90% 92%  Weight:      Height:        Intake/Output Summary (Last 24 hours) at 04/18/2019 1717 Last data filed at 04/18/2019 1321 Gross per 24 hour  Intake 340 ml  Output 5 ml  Net 335 ml   Filed Weights   04/15/19 2353 04/16/19 0229  Weight: 97.1 kg 93.5 kg    Exam:  . General: 68 y.o. year-old  female obese in no acute distress.  Alert and oriented x3.   . Cardiovascular: Regular rate and rhythm no rubs or gallops.  No JVD or thyromegaly noted.   Marland Kitchen Respiratory: Clear to oscillation no wheezes or rales.  Poor inspiratory effort.  . Abdomen: Obese nontender nondistended normal bowel sounds  present.  . Musculoskeletal: Trace lower extremity edema.  2/4 pulses in all 4 extremities.  Right heel with diabetic ulcer.   Marland Kitchen Psychiatry: Mood is appropriate for condition and setting.   Data Reviewed: CBC: Recent Labs  Lab 04/15/19 2053 04/16/19 0554 04/17/19 0210  WBC 8.2 9.9 9.0  NEUTROABS 6.6  --   --   HGB 14.5 13.5 12.0  HCT 44.7 41.6 36.5  MCV 86.0 84.9 85.5  PLT 189 188 275   Basic Metabolic Panel: Recent Labs  Lab 04/15/19 2053 04/16/19 0554 04/17/19 0210  NA 137 141 139  K 3.7 4.1 3.9  CL 95* 105 103  CO2 27 24 26   GLUCOSE 249* 315* 132*  BUN 38* 37* 40*  CREATININE 2.33* 2.20* 2.30*  CALCIUM 8.8* 8.5* 8.4*   GFR: Estimated Creatinine Clearance: 27.5 mL/min (A) (by C-G formula based on SCr of 2.3 mg/dL (H)). Liver Function Tests: Recent Labs  Lab 04/15/19 2053 04/16/19 0554  AST 19 19  ALT 18 17  ALKPHOS 82 74  BILITOT 0.7 0.9  PROT 6.6 6.1*  ALBUMIN 3.3* 3.0*   Recent Labs  Lab 04/15/19 2053  LIPASE 44   No results for input(s): AMMONIA in the last 168 hours. Coagulation Profile: No results for input(s): INR, PROTIME in the last 168 hours. Cardiac Enzymes: No results for input(s): CKTOTAL, CKMB, CKMBINDEX, TROPONINI in the last 168 hours. BNP (last 3 results) No results for input(s): PROBNP in the last 8760 hours. HbA1C: Recent Labs    04/16/19 0054  HGBA1C 11.0*   CBG: Recent Labs  Lab 04/17/19 1756 04/17/19 2150 04/18/19 0748 04/18/19 1151 04/18/19 1711  GLUCAP 274* 252* 186* 377* 438*   Lipid Profile: No results for input(s): CHOL, HDL, LDLCALC, TRIG, CHOLHDL, LDLDIRECT in the last 72 hours. Thyroid Function Tests: No results for input(s): TSH, T4TOTAL, FREET4, T3FREE, THYROIDAB in the last 72 hours. Anemia Panel: No results for input(s): VITAMINB12, FOLATE, FERRITIN, TIBC, IRON, RETICCTPCT in the last 72 hours. Urine analysis:    Component Value Date/Time   COLORURINE YELLOW 04/15/2019 2310   APPEARANCEUR CLEAR  04/15/2019 2310   APPEARANCEUR Clear 06/19/2018 1637   LABSPEC 1.012 04/15/2019 2310   PHURINE 7.0 04/15/2019 2310   GLUCOSEU >=500 (A) 04/15/2019 2310   HGBUR NEGATIVE 04/15/2019 2310   BILIRUBINUR NEGATIVE 04/15/2019 2310   BILIRUBINUR Negative 06/19/2018 [place] 04/15/2019 2310   PROTEINUR 30 (A) 04/15/2019 2310   UROBILINOGEN 1.0 11/07/2014 2242   NITRITE NEGATIVE 04/15/2019 2310   LEUKOCYTESUR NEGATIVE 04/15/2019 2310   Sepsis Labs: @LABRCNTIP (procalcitonin:4,lacticidven:4)  ) Recent Results (from the past 240 hour(s))  Blood Culture (routine x 2)     Status: Abnormal (Preliminary result)   Collection Time: 04/15/19  9:07 PM   Specimen: BLOOD  Result Value Ref Range Status   Specimen Description BLOOD RIGHT ANTECUBITAL  Final   Special Requests   Final    BOTTLES DRAWN AEROBIC AND ANAEROBIC Blood Culture results may not be optimal due to an inadequate volume of blood received in culture bottles   Culture  Setup Time   Final    GRAM POSITIVE COCCI IN BOTH AEROBIC AND ANAEROBIC  BOTTLES CRITICAL RESULT CALLED TO, READ BACK BY AND VERIFIED WITH: Rondel Oh PharmD 11:10 04/17/19 ([email or web address])    Culture (A)  Final    ENTEROCOCCUS FAECALIS SUSCEPTIBILITIES TO FOLLOW Performed at [facility], [place] [address], [place], [place] [address]    Report Status PENDING  Incomplete  Blood Culture ID Panel (Reflexed)     Status: Abnormal   Collection Time: 04/15/19  9:07 PM  Result Value Ref Range Status   Enterococcus species DETECTED (A) NOT DETECTED Final    Comment: CRITICAL RESULT CALLED TO, READ BACK BY AND VERIFIED WITH: Rondel Oh PharmD 11:10 04/17/19 ([email or web address])    Vancomycin resistance NOT DETECTED NOT DETECTED Final   Listeria monocytogenes NOT DETECTED NOT DETECTED Final   Staphylococcus species NOT DETECTED NOT DETECTED Final   Staphylococcus aureus (BCID) NOT DETECTED NOT DETECTED Final   Streptococcus species NOT DETECTED NOT DETECTED Final    Streptococcus agalactiae NOT DETECTED NOT DETECTED Final   Streptococcus pneumoniae NOT DETECTED NOT DETECTED Final   Streptococcus pyogenes NOT DETECTED NOT DETECTED Final   Acinetobacter baumannii NOT DETECTED NOT DETECTED Final   Enterobacteriaceae species NOT DETECTED NOT DETECTED Final   Enterobacter cloacae complex NOT DETECTED NOT DETECTED Final   Escherichia coli NOT DETECTED NOT DETECTED Final   Klebsiella oxytoca NOT DETECTED NOT DETECTED Final   Klebsiella pneumoniae NOT DETECTED NOT DETECTED Final   Proteus species NOT DETECTED NOT DETECTED Final   Serratia marcescens NOT DETECTED NOT DETECTED Final   Haemophilus influenzae NOT DETECTED NOT DETECTED Final   Neisseria meningitidis NOT DETECTED NOT DETECTED Final   Pseudomonas aeruginosa NOT DETECTED NOT DETECTED Final   Candida albicans NOT DETECTED NOT DETECTED Final   Candida glabrata NOT DETECTED NOT DETECTED Final   Candida krusei NOT DETECTED NOT DETECTED Final   Candida parapsilosis NOT DETECTED NOT DETECTED Final   Candida tropicalis NOT DETECTED NOT DETECTED Final    Comment: Performed at [facility], [address], [place], [place] [address]  Blood Culture (routine x 2)     Status: None (Preliminary result)   Collection Time: 04/15/19  9:08 PM   Specimen: BLOOD  Result Value Ref Range Status   Specimen Description BLOOD RIGHT WRIST  Final   Special Requests   Final    BOTTLES DRAWN AEROBIC AND ANAEROBIC Blood Culture results may not be optimal due to an inadequate volume of blood received in culture bottles   Culture  Setup Time   Final    GRAM POSITIVE COCCI IN BOTH AEROBIC AND ANAEROBIC BOTTLES CRITICAL VALUE NOTED.  VALUE IS CONSISTENT WITH PREVIOUSLY REPORTED AND CALLED VALUE. Performed at [facility], [place] [address] [place], [place] [address]    Culture GRAM POSITIVE COCCI  Final   Report Status PENDING  Incomplete  Urine culture     Status: None   Collection Time: 04/15/19 11:11 PM    Specimen: In/Out Cath Urine  Result Value Ref Range Status   Specimen Description IN/OUT CATH URINE  Final   Special Requests NONE  Final   Culture   Final    NO GROWTH Performed at [facility], [place] [address], [place], [place] [address]    Report Status 04/17/2019 FINAL  Final  SARS Coronavirus 2 [facility] order, Performed in [facility] hospital lab) Nasopharyngeal Nasopharyngeal Swab     Status: None   Collection Time: 04/15/19 11:52 PM   Specimen: Nasopharyngeal Swab  Result Value Ref Range Status  SARS Coronavirus 2 NEGATIVE NEGATIVE Final    Comment: (NOTE) If result is NEGATIVE SARS-CoV-2 target nucleic acids are NOT DETECTED. The SARS-CoV-2 RNA is generally detectable in upper and lower  respiratory specimens during the acute phase of infection. The lowest  concentration of SARS-CoV-2 viral copies this assay can detect is 250  copies / mL. A negative result does not preclude SARS-CoV-2 infection  and should not be used as the sole basis for treatment or other  patient management decisions.  A negative result may occur with  improper specimen collection / handling, submission of specimen other  than nasopharyngeal swab, presence of viral mutation(s) within the  areas targeted by this assay, and inadequate number of viral copies  (<250 copies / mL). A negative result must be combined with clinical  observations, patient history, and epidemiological information. If result is POSITIVE SARS-CoV-2 target nucleic acids are DETECTED. The SARS-CoV-2 RNA is generally detectable in upper and lower  respiratory specimens dur ing the acute phase of infection.  Positive  results are indicative of active infection with SARS-CoV-2.  Clinical  correlation with patient history and other diagnostic information is  necessary to determine patient infection status.  Positive results do  not rule out bacterial infection or co-infection with other viruses. If result is PRESUMPTIVE  POSTIVE SARS-CoV-2 nucleic acids MAY BE PRESENT.   A presumptive positive result was obtained on the submitted specimen  and confirmed on repeat testing.  While 2019 novel coronavirus  (SARS-CoV-2) nucleic acids may be present in the submitted sample  additional confirmatory testing may be necessary for epidemiological  and / or clinical management purposes  to differentiate between  SARS-CoV-2 and other Sarbecovirus currently known to infect humans.  If clinically indicated additional testing with an alternate test  methodology [phone]) is advised. The SARS-CoV-2 RNA is generally  detectable in upper and lower respiratory sp ecimens during the acute  phase of infection. The expected result is Negative. Fact Sheet for Patients:  StrictlyIdeas.no Fact Sheet for Healthcare Providers: BankingDealers.co.za This test is not yet approved or cleared by the [place] FDA and has been authorized for detection and/or diagnosis of SARS-CoV-2 by FDA under an Emergency Use Authorization (EUA).  This EUA will remain in effect (meaning this test can be used) for the duration of the COVID-19 declaration under Section 564(b)(1) of the Act, 21 U.S.C. section 360bbb-3(b)(1), unless the authorization is terminated or revoked sooner. Performed at [facility], [place] [address], [place], [place] [address]   Culture, blood (routine x 2)     Status: None (Preliminary result)   Collection Time: 04/17/19  3:15 PM   Specimen: BLOOD  Result Value Ref Range Status   Specimen Description BLOOD RIGHT ANTECUBITAL  Final   Special Requests   Final    BOTTLES DRAWN AEROBIC AND ANAEROBIC Blood Culture results may not be optimal due to an excessive volume of blood received in culture bottles   Culture   Final    NO GROWTH < 24 HOURS Performed at [facility], [place] [address] [place], [place] [address]    Report Status PENDING  Incomplete  Culture, blood  (routine x 2)     Status: None (Preliminary result)   Collection Time: 04/17/19  3:23 PM   Specimen: BLOOD  Result Value Ref Range Status   Specimen Description BLOOD BLOOD RIGHT FOREARM  Final   Special Requests   Final    BOTTLES DRAWN AEROBIC AND ANAEROBIC Blood Culture  results may not be optimal due to an excessive volume of blood received in culture bottles   Culture   Final    NO GROWTH < 24 HOURS Performed at [facility] [address], [place], [place] [address]    Report Status PENDING  Incomplete      Studies: No results found.  Scheduled Meds: . apixaban  5 mg Oral BID  . calcium-vitamin D  1 tablet Oral 3 times weekly  . carvedilol  25 mg Oral BID  . cholecalciferol  2,000 Units Oral Daily  . fenofibrate  54 mg Oral Daily  . hydrALAZINE  10 mg Oral Q6H  . insulin aspart  0-15 Units Subcutaneous TID WC  . insulin aspart  0-5 Units Subcutaneous QHS  . insulin aspart protamine- aspart  80 Units Subcutaneous BID WC  . mupirocin ointment  1 application Topical BID  . nutrition supplement (JUVEN)  1 packet Oral BID BM  . pravastatin  40 mg Oral q1800  . prenatal vitamin w/FE, FA  1 tablet Oral 3 times weekly  . Ensure Max Protein  11 oz Oral QHS  . torsemide  20 mg Oral BID  . vitamin E  400 Units Oral 3 times weekly    Continuous Infusions: . ampicillin-sulbactam (UNASYN) IV 3 g (04/18/19 1052)     LOS: 2 days     Kayleen Memos, MD [facility] Pager [phone]  If 7PM-7AM, please contact night-coverage [email or web address] Password [facility] 04/18/2019, 5:17 PM

## 2019-04-18 NOTE — Progress Notes (Signed)
Physical Therapy Treatment Patient Details Name: [name] MRN: [id number] DOB: 1950/09/21 Today's Date: 04/18/2019    History of Present Illness Pt is a 68 y/o female admitted secondary to generalized weakness, thought to be secondary to multifactoral cause. PMH includes obesity, CAD, CKD, DM, HTN, a fib, and s/p TAVR.     PT Comments    Pt progressing well with mobility. Pt declining SNF and reports family able to provide needed level of assist at home. Discharge recommendations updated to HHPT. Pt required min guard assist bed mobility, min assist transfers and min guard assist ambulation 20 feet with RW. Pt without c/o dizziness. Pt positioned in recliner with feet elevated at end of session.    Follow Up Recommendations  Home health PT;Supervision/Assistance - 24 hour     Equipment Recommendations  None recommended by PT    Recommendations for Other Services       Precautions / Restrictions Precautions Precautions: Fall Precaution Comments: Reports "sliding" off of BSC prior to admission secondary to dizziness.     Mobility  Bed Mobility Overal bed mobility: Needs Assistance Bed Mobility: Supine to Sit     Supine to sit: Min guard;HOB elevated     General bed mobility comments: +rail, increased time, min guard for safety  Transfers Overall transfer level: Needs assistance Equipment used: Rolling walker (2 wheeled) Transfers: Sit to/from [facility] Sit to Stand: Min assist Stand pivot transfers: Min assist       General transfer comment: assist to power up, increased time to stabilize initial standing balance  Ambulation/Gait Ambulation/Gait assistance: Min guard Gait Distance (Feet): 20 Feet Assistive device: Rolling walker (2 wheeled) Gait Pattern/deviations: Step-through pattern;Decreased stride length;Trunk flexed Gait velocity: decreased Gait velocity interpretation: <1.31 ft/sec, indicative of household ambulator General Gait  Details: min guard for safety, distance limited by fatigue   Stairs             Wheelchair Mobility    Modified Rankin (Stroke Patients Only)       Balance Overall balance assessment: Needs assistance Sitting-balance support: No upper extremity supported;Feet supported Sitting balance-Leahy Scale: Good     Standing balance support: Bilateral upper extremity supported;During functional activity Standing balance-Leahy Scale: Poor Standing balance comment: Reliant on BUE support                             Cognition Arousal/Alertness: Awake/alert Behavior During Therapy: WFL for tasks assessed/performed Overall Cognitive Status: No family/caregiver present to determine baseline cognitive functioning                                        Exercises      General Comments        Pertinent Vitals/Pain Pain Assessment: No/denies pain    Home Living                      Prior Function            PT Goals (current goals can now be found in the care plan section) Acute Rehab PT Goals Patient Stated Goal: home PT Goal Formulation: With patient Time For Goal Achievement: 04/30/19 Potential to Achieve Goals: Good Progress towards PT goals: Progressing toward goals    Frequency    Min 3X/week      PT Plan Discharge plan needs to  be updated    Co-evaluation              AM-PAC PT "6 Clicks" Mobility   Outcome Measure  Help needed turning from your back to your side while in a flat bed without using bedrails?: None Help needed moving from lying on your back to sitting on the side of a flat bed without using bedrails?: A Little Help needed moving to and from a bed to a chair (including a wheelchair)?: A Little Help needed standing up from a chair using your arms (e.g., wheelchair or bedside chair)?: A Little Help needed to walk in hospital room?: A Little Help needed climbing 3-5 steps with a railing? : A Lot 6  Click Score: 18    End of Session Equipment Utilized During Treatment: Gait belt Activity Tolerance: Patient tolerated treatment well Patient left: in chair;with chair alarm set;with call bell/phone within reach Nurse Communication: Mobility status PT Visit Diagnosis: Muscle weakness (generalized) (M62.81);History of falling (Z91.81);Unsteadiness on feet (R26.81)     Time: 8891-6945 PT Time Calculation (min) (ACUTE ONLY): 13 min  Charges:  $Gait Training: 8-22 mins                     Lorrin Goodell, PT  Office # [phone] Pager [phone]    [name] 04/18/2019, 9:59 AM

## 2019-04-18 NOTE — Consult Note (Signed)
PV Navigator Consult acknowledged and chart reviewed. Able to speak with patient's husband by phone. 68 y/o female admitted from home through ED with weakness and fever 100.4. She was noted to have a chronic wound to her left heel that her husband and family has been performing dressing changes. He states he has one daughter that is a Marine scientist and two others that are respiratory therapists that assist him when he needs it. He also says that HHPT comes out to work with patient but that he does not have nursing services. Patient does not see a provider regularly for wound assessment/care.  I have reached out to patient's PCP and case manager to see if nursing services would be an option to follow wound at home.  Medical history to include CAD, CKD, HTN, HLD, DM (Hemoglobin A1c-11.0), and TIA. Pertinent Labs: Albumin- 3.0   BUN-37   Creatinine-2.20 Active consults in place for Nutrition, PT, Diabetes Coordinator and WOC.  PV Navigator Contact information left with husband in event of questions or barriers. No concerns voiced at this time. Thank you for this consult.  [employer]is [employer] RN BSN CWS [place] [phone]

## 2019-04-18 NOTE — TOC Progression Note (Addendum)
Transition of Care [facility]) - Progression Note    Patient Details  Name: GRACELAND WACHTER MRN: [id number] Date of Birth: 21-Dec-1950  Transition of Care [facility]) CM/SW Contact  Jacalyn Lefevre Edson Snowball, RN Phone Number: 04/18/2019, 10:45 AM  Clinical Narrative:     Patient from home with husband. See previous TOC note. Provided Medicare. gov list for home health. Patient recently had HHPT and would like same HHPT. Patient believes it was through [facility]. Called Valerie with [facility] she will check and call back.   Will need home health orders and face to face.   Confirmed with [facility] patient can have same HHPT and can add HHRN again asked MD for orders . Did not receive message from [employer]   Expected Discharge Plan: [place] Barriers to Discharge: Continued Medical Work up  Expected Discharge Plan and Services Expected Discharge Plan: [place] In-house Referral: Clinical Social Work Discharge Planning Services: CM Consult Post Acute Care Choice: [place] arrangements for the past 2 months: Single Family Home                 DME Arranged: N/A         HH Arranged: PT           Social Determinants of Health (SDOH) Interventions    Readmission Risk Interventions Readmission Risk Prevention Plan 05/10/2018  Transportation Screening Complete  PCP or Specialist Appt within 5-7 Days Complete  Home Care Screening Complete  Medication Review (RN CM) Complete  Some recent data might be hidden

## 2019-04-18 NOTE — Progress Notes (Signed)
[facility]   Reason for visit: Follow up on Enterococcal sepsis  Antibiotics: Unasyn, day 2  Interval History: some ambulation within the room and hallway earlier today. Inquiring about when she can go home. RT ankle/heel wound remains painful. Echo and MRI not yet performed. Pt admits to chronic urinary incontinence for the past 7+ months and now agrees this issue may have resulted in contamination of her RT heel wound. Fever curve, WBC and CR trends, cx results, imaging and ABX usage all independently reviewed    Current Facility-Administered Medications:  .  acetaminophen (TYLENOL) tablet 650 mg, 650 mg, Oral, Q6H PRN, Irene Pap N, DO, 650 mg at 04/18/19 1214 .  Ampicillin-Sulbactam (UNASYN) 3 g in sodium chloride 0.9 % 100 mL IVPB, 3 g, Intravenous, Q12H, Hall, Carole N, DO, Last Rate: 200 mL/hr at 04/18/19 1052, 3 g at 04/18/19 1052 .  apixaban (ELIQUIS) tablet 5 mg, 5 mg, Oral, BID, Jonelle Sidle, Mohammad L, MD, 5 mg at 04/18/19 1054 .  calcium-vitamin D (OSCAL WITH D) 500-200 MG-UNIT per tablet 1 tablet, 1 tablet, Oral, 3 times weekly, Elwyn Reach, MD, 1 tablet at 04/17/19 0823 .  carvedilol (COREG) tablet 25 mg, 25 mg, Oral, BID, Jonelle Sidle, Mohammad L, MD, 25 mg at 04/18/19 1054 .  cholecalciferol (VITAMIN D3) tablet 2,000 Units, 2,000 Units, Oral, Daily, Elwyn Reach, MD, 2,000 Units at 04/18/19 1054 .  fenofibrate tablet 54 mg, 54 mg, Oral, Daily, Jonelle Sidle, Mohammad L, MD, 54 mg at 04/18/19 1054 .  hydrALAZINE (APRESOLINE) tablet 10 mg, 10 mg, Oral, Q6H, Garba, Mohammad L, MD, 10 mg at 04/18/19 1759 .  insulin aspart (novoLOG) injection 0-20 Units, 0-20 Units, Subcutaneous, TID WC, Hall, Carole N, DO, 20 Units at 04/18/19 1800 .  insulin aspart (novoLOG) injection 0-5 Units, 0-5 Units, Subcutaneous, QHS, Hall, Carole N, DO .  insulin aspart protamine- aspart (NOVOLOG MIX 70/30) injection 80 Units, 80 Units, Subcutaneous, BID WC, Hall, Carole N, DO, 80  Units at 04/18/19 1800 .  meclizine (ANTIVERT) tablet 12.5 mg, 12.5 mg, Oral, TID PRN, Jonelle Sidle, Mohammad L, MD .  mupirocin ointment (BACTROBAN) 2 % 1 application, 1 application, Topical, BID, Elwyn Reach, MD, 1 application at 97/02/63 1058 .  nutrition supplement (JUVEN) (JUVEN) powder packet 1 packet, 1 packet, Oral, BID BM, Kayleen Memos, DO, 1 packet at 04/18/19 1356 .  ondansetron (ZOFRAN) tablet 4 mg, 4 mg, Oral, Q6H PRN **OR** ondansetron (ZOFRAN) injection 4 mg, 4 mg, Intravenous, Q6H PRN, Garba, Mohammad L, MD .  polyvinyl alcohol (LIQUIFILM TEARS) 1.4 % ophthalmic solution 1 drop, 1 drop, Both Eyes, Daily PRN, Jonelle Sidle, Mohammad L, MD .  pravastatin (PRAVACHOL) tablet 40 mg, 40 mg, Oral, q1800, Gala Romney L, MD, 40 mg at 04/18/19 1759 .  prenatal vitamin w/FE, FA (PRENATAL 1 + 1) 27-1 MG tablet 1 tablet, 1 tablet, Oral, 3 times weekly, Elwyn Reach, MD, 1 tablet at 04/17/19 1119 .  protein supplement (ENSURE MAX) liquid, 11 oz, Oral, QHS, Hall, Carole N, DO, 11 oz at 04/18/19 0520 .  sodium chloride (OCEAN) 0.65 % nasal spray 1 spray, 1 spray, Each Nare, PRN, Jonelle Sidle, Mohammad L, MD .  torsemide (DEMADEX) tablet 20 mg, 20 mg, Oral, BID, Jonelle Sidle, Mohammad L, MD, 20 mg at 04/18/19 1759 .  vitamin E capsule 400 Units, 400 Units, Oral, 3 times weekly, Elwyn Reach, MD, 400 Units at 04/17/19 7858   Physical Exam:   Vitals:  04/18/19 0603 04/18/19 1455  BP: (!) 134/51 (!) 122/42  Pulse: 87 80  Resp: 19 18  Temp: 99.1 F (37.3 C) 98.1 F (36.7 C)  SpO2: 90% 92%   Physical Exam Gen: obese, NAD, A&Ox 3 Head: NCAT, no temporal wasting evident EENT: PERRL, EOMI, MMM, adequate dentition Neck: supple, no JVD CV: NRRR, I/VI diastolic murmur at LUSB appreciated Pulm: CTA bilaterally, no wheeze or retractions Abd: soft, obese, well-healed midline incision, NTND, +BS Extrems:  trace LE edema, 2+ pulses MSK: half dollar sized ulcer at RT malleolus with overlying eschar making  wound unstageable, + surrounding erythema but no drainage evident Skin: chronic venous stasis to distal LEs, adequate skin turgor Neuro: CN II-XII grossly intact, no focal neurologic deficits appreciated, gait was not assessed, A&Ox 3  Review of Systems:  Review of Systems  Constitutional: Negative for chills, fever and weight loss.  HENT: Negative for congestion, hearing loss, sinus pain and sore throat.   Eyes: Negative for blurred vision, photophobia and discharge.  Respiratory: Negative for cough, hemoptysis and shortness of breath.   Cardiovascular: Negative for chest pain, palpitations, orthopnea and leg swelling.  Gastrointestinal: Negative for abdominal pain, constipation, diarrhea, heartburn, nausea and vomiting.  Genitourinary: Negative for dysuria, flank pain, frequency and urgency.       +urinary incontinence x 7 months  Musculoskeletal: Positive for joint pain. Negative for back pain and myalgias.       RT ankle/heel pain  Skin: Negative for itching and rash.  Neurological: Negative for tremors, seizures, weakness and headaches.  Endo/Heme/Allergies: Negative for polydipsia. Does not bruise/bleed easily.  Psychiatric/Behavioral: Negative for depression and substance abuse. The patient is not nervous/anxious and does not have insomnia.      Lab Results  Component Value Date   WBC 9.0 04/17/2019   HGB 12.0 04/17/2019   HCT 36.5 04/17/2019   MCV 85.5 04/17/2019   PLT 167 04/17/2019    Lab Results  Component Value Date   CREATININE 2.30 (H) 04/17/2019   BUN 40 (H) 04/17/2019   NA 139 04/17/2019   K 3.9 04/17/2019   CL 103 04/17/2019   CO2 26 04/17/2019    Lab Results  Component Value Date   ALT 17 04/16/2019   AST 19 04/16/2019   ALKPHOS 74 04/16/2019     Microbiology: Recent Results (from the past 240 hour(s))  Blood Culture (routine x 2)     Status: Abnormal (Preliminary result)   Collection Time: 04/15/19  9:07 PM   Specimen: BLOOD  Result Value Ref  Range Status   Specimen Description BLOOD RIGHT ANTECUBITAL  Final   Special Requests   Final    BOTTLES DRAWN AEROBIC AND ANAEROBIC Blood Culture results may not be optimal due to an inadequate volume of blood received in culture bottles   Culture  Setup Time   Final    GRAM POSITIVE COCCI IN BOTH AEROBIC AND ANAEROBIC BOTTLES CRITICAL RESULT CALLED TO, READ BACK BY AND VERIFIED WITH: Rondel Oh PharmD 11:10 04/17/19 ([email or web address])    Culture (A)  Final    ENTEROCOCCUS FAECALIS SUSCEPTIBILITIES TO FOLLOW Performed at [facility], [place] [address] [place], [place] [address]    Report Status PENDING  Incomplete  Blood Culture ID Panel (Reflexed)     Status: Abnormal   Collection Time: 04/15/19  9:07 PM  Result Value Ref Range Status   Enterococcus species DETECTED (A) NOT DETECTED Final    Comment: CRITICAL RESULT CALLED TO, READ BACK BY  AND VERIFIED WITH: Rondel Oh PharmD 11:10 04/17/19 ([email or web address])    Vancomycin resistance NOT DETECTED NOT DETECTED Final   Listeria monocytogenes NOT DETECTED NOT DETECTED Final   Staphylococcus species NOT DETECTED NOT DETECTED Final   Staphylococcus aureus (BCID) NOT DETECTED NOT DETECTED Final   Streptococcus species NOT DETECTED NOT DETECTED Final   Streptococcus agalactiae NOT DETECTED NOT DETECTED Final   Streptococcus pneumoniae NOT DETECTED NOT DETECTED Final   Streptococcus pyogenes NOT DETECTED NOT DETECTED Final   Acinetobacter baumannii NOT DETECTED NOT DETECTED Final   Enterobacteriaceae species NOT DETECTED NOT DETECTED Final   Enterobacter cloacae complex NOT DETECTED NOT DETECTED Final   Escherichia coli NOT DETECTED NOT DETECTED Final   Klebsiella oxytoca NOT DETECTED NOT DETECTED Final   Klebsiella pneumoniae NOT DETECTED NOT DETECTED Final   Proteus species NOT DETECTED NOT DETECTED Final   Serratia marcescens NOT DETECTED NOT DETECTED Final   Haemophilus influenzae NOT DETECTED NOT DETECTED Final   Neisseria meningitidis NOT DETECTED  NOT DETECTED Final   Pseudomonas aeruginosa NOT DETECTED NOT DETECTED Final   Candida albicans NOT DETECTED NOT DETECTED Final   Candida glabrata NOT DETECTED NOT DETECTED Final   Candida krusei NOT DETECTED NOT DETECTED Final   Candida parapsilosis NOT DETECTED NOT DETECTED Final   Candida tropicalis NOT DETECTED NOT DETECTED Final    Comment: Performed at [facility], [address] [place], [place] [address]  Blood Culture (routine x 2)     Status: None (Preliminary result)   Collection Time: 04/15/19  9:08 PM   Specimen: BLOOD  Result Value Ref Range Status   Specimen Description BLOOD RIGHT WRIST  Final   Special Requests   Final    BOTTLES DRAWN AEROBIC AND ANAEROBIC Blood Culture results may not be optimal due to an inadequate volume of blood received in culture bottles   Culture  Setup Time   Final    GRAM POSITIVE COCCI IN BOTH AEROBIC AND ANAEROBIC BOTTLES CRITICAL VALUE NOTED.  VALUE IS CONSISTENT WITH PREVIOUSLY REPORTED AND CALLED VALUE. Performed at [facility], [place] [address] [place], [place] [address]    Culture GRAM POSITIVE COCCI  Final   Report Status PENDING  Incomplete  Urine culture     Status: None   Collection Time: 04/15/19 11:11 PM   Specimen: In/Out Cath Urine  Result Value Ref Range Status   Specimen Description IN/OUT CATH URINE  Final   Special Requests NONE  Final   Culture   Final    NO GROWTH Performed at [facility], [place] [address], [place], [place] [address]    Report Status 04/17/2019 FINAL  Final  SARS Coronavirus 2 [facility] order, Performed in [facility] hospital lab) Nasopharyngeal Nasopharyngeal Swab     Status: None   Collection Time: 04/15/19 11:52 PM   Specimen: Nasopharyngeal Swab  Result Value Ref Range Status   SARS Coronavirus 2 NEGATIVE NEGATIVE Final    Comment: (NOTE) If result is NEGATIVE SARS-CoV-2 target nucleic acids are NOT DETECTED. The SARS-CoV-2 RNA is generally detectable in upper and lower   respiratory specimens during the acute phase of infection. The lowest  concentration of SARS-CoV-2 viral copies this assay can detect is 250  copies / mL. A negative result does not preclude SARS-CoV-2 infection  and should not be used as the sole basis for treatment or other  patient management decisions.  A negative result may occur with  improper specimen collection / handling,  submission of specimen other  than nasopharyngeal swab, presence of viral mutation(s) within the  areas targeted by this assay, and inadequate number of viral copies  (<250 copies / mL). A negative result must be combined with clinical  observations, patient history, and epidemiological information. If result is POSITIVE SARS-CoV-2 target nucleic acids are DETECTED. The SARS-CoV-2 RNA is generally detectable in upper and lower  respiratory specimens dur ing the acute phase of infection.  Positive  results are indicative of active infection with SARS-CoV-2.  Clinical  correlation with patient history and other diagnostic information is  necessary to determine patient infection status.  Positive results do  not rule out bacterial infection or co-infection with other viruses. If result is PRESUMPTIVE POSTIVE SARS-CoV-2 nucleic acids MAY BE PRESENT.   A presumptive positive result was obtained on the submitted specimen  and confirmed on repeat testing.  While 2019 novel coronavirus  (SARS-CoV-2) nucleic acids may be present in the submitted sample  additional confirmatory testing may be necessary for epidemiological  and / or clinical management purposes  to differentiate between  SARS-CoV-2 and other Sarbecovirus currently known to infect humans.  If clinically indicated additional testing with an alternate test  methodology [phone]) is advised. The SARS-CoV-2 RNA is generally  detectable in upper and lower respiratory sp ecimens during the acute  phase of infection. The expected result is Negative. Fact  Sheet for Patients:  StrictlyIdeas.no Fact Sheet for Healthcare Providers: BankingDealers.co.za This test is not yet approved or cleared by the [place] FDA and has been authorized for detection and/or diagnosis of SARS-CoV-2 by FDA under an Emergency Use Authorization (EUA).  This EUA will remain in effect (meaning this test can be used) for the duration of the COVID-19 declaration under Section 564(b)(1) of the Act, 21 U.S.C. section 360bbb-3(b)(1), unless the authorization is terminated or revoked sooner. Performed at [facility], [place] [address], [place], [place] [address]   Culture, blood (routine x 2)     Status: None (Preliminary result)   Collection Time: 04/17/19  3:15 PM   Specimen: BLOOD  Result Value Ref Range Status   Specimen Description BLOOD RIGHT ANTECUBITAL  Final   Special Requests   Final    BOTTLES DRAWN AEROBIC AND ANAEROBIC Blood Culture results may not be optimal due to an excessive volume of blood received in culture bottles   Culture   Final    NO GROWTH < 24 HOURS Performed at [facility], [place] [address], [place], [place] [address]    Report Status PENDING  Incomplete  Culture, blood (routine x 2)     Status: None (Preliminary result)   Collection Time: 04/17/19  3:23 PM   Specimen: BLOOD  Result Value Ref Range Status   Specimen Description BLOOD BLOOD RIGHT FOREARM  Final   Special Requests   Final    BOTTLES DRAWN AEROBIC AND ANAEROBIC Blood Culture results may not be optimal due to an excessive volume of blood received in culture bottles   Culture   Final    NO GROWTH < 24 HOURS Performed at [facility], [place] [address], [place], [place] [address]    Report Status PENDING  Incomplete    Impression/Plan: Patient is a 68 year old morbidly obese white female status post gastric bypass with stage III chronic kidney disease, h/o severe aortic stenosis, s/p TAVR and a chronic right  heel ulcer admitted with malaise and subsequently found to have Enterococcal bacteremia.  1. 1.  Enterococcal sepsis -both of the patient's initial blood cultures are now positive for Enterococcus faecalis.  She was empirically started on vancomycin, rocephin, and Flagyl.  She has a reported allergy to penicillin which caused nausea and potentially emesis when she received a IM penicillin injection many years ago.  As her enterococcus is ampicillin sensitive, she was given a test dose of oral penicillin last evening which she tolerated and then was transitioned to IV Unasyn instead.  Repeat blood cultures x2 to establish clearance of her bacteremia.  Given her history of a TAVR, would proceed directly to transesophageal echocardiogram to best evaluate her TAVR apparatus and to rule out endocarditis.  Given the chronic nature of her right heel wound and poorly controlled diabetes, I am concerned that she is at rather high risk for subacute endocarditis.  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  It is unclear why she did not seek medical attention for this wound earlier.  Based on conversations with the patient it appears to be multifactorial as the COVID pandemic made access to medical care more challenging, she appears to have an element of peripheral neuropathy perhaps from DM and her gastric bypass history and subsequent vitamin deficiencies, and overall neglect/denial of her medical comorbidities and infectious risks.  Baseline CRP here is 10.2. Continue unasyn for now. Would proceed with an MRI of his RT ankle to evaluate for osteomyelitis. If this is confirmed, would consult orthopedics for debridement vs. Amputation as calcaneal osteomyelitis is notorious to fail efforts to medically treat even with prolonged IV ABX and a continued open wound will place her at risk for relapsing bacteremias and potential  seeding to her TAVR apparatus.  3. CRI - The patient's Cr baseline is unclear as she has ranged from 1.78 to 3.21 within the past 9 months, mostly with a trend of worsening in recent months. Her Cr today is 2.30, more consistent with stage IV renal disease. Difficult to say if this is true AoCRF though, particularly as her DM has been mostly poorly controlled with ambulatory blood sugars in the mid 200s-low 300s in recent months per the patient.  I anticipate the patient will need some sort of long-term IV access for parenteral antibiotics, but the duration of this treatment is unknown at this time.  I worry she may need a PICC line if right heel osteomyelitis is confirmed as this often requires 8 weeks of IV antibiotic therapy to properly treat. I would have a low threshold for consulting nephrology if her Cr fails to improve as she is a medically tenuous patient from a renal standpoint.

## 2019-04-18 NOTE — Progress Notes (Signed)
Inpatient Diabetes Program Recommendations  AACE/ADA: New Consensus Statement on Inpatient Glycemic Control (2015)  Target Ranges:  Prepandial:   less than 140 mg/dL      Peak postprandial:   less than 180 mg/dL (1-2 hours)      Critically ill patients:  140 - 180 mg/dL   Lab Results  Component Value Date   GLUCAP 377 (H) 04/18/2019   HGBA1C 11.0 (H) 04/16/2019    Review of Glycemic Control Results for [name], [name] (MRN [id number]) as of 04/18/2019 12:55  Ref. Range 04/17/2019 17:56 04/17/2019 21:50 04/18/2019 07:48 04/18/2019 11:51  Glucose-Capillary Latest Ref Range: 70 - 99 mg/dL 274 (H) 252 (H) 186 (H) 377 (H)   Diabetes history: Type 2 DM Outpatient Diabetes medications: Novolog 70/30 75 (B)/60 (D) /60 (QHS)  Current orders for Inpatient glycemic control: Novolog 70/30 75 units BID, Novolog 0-15 units TID, Novolog 0-5 units QHS  Inpatient Diabetes Program Recommendations:    Recommend increasing Novolog 70/30 80 units BID    Thanks, Bronson Curb, MSN, RNC-OB Diabetes Coordinator [phone] (8a-5p)

## 2019-04-18 NOTE — Progress Notes (Signed)
Met patient on morning rounds. Patient was pleasant and happy to be visited. She requested prayer and was very thankful afterwards. Will continue to provide spiritual care as needed.

## 2019-04-19 LAB — BASIC METABOLIC PANEL
Anion gap: 13 (ref 5–15)
BUN: 61 mg/dL — ABNORMAL HIGH (ref 8–23)
CO2: 27 mmol/L (ref 22–32)
Calcium: 8.6 mg/dL — ABNORMAL LOW (ref 8.9–10.3)
Chloride: 99 mmol/L (ref 98–111)
Creatinine, Ser: 2.39 mg/dL — ABNORMAL HIGH (ref 0.44–1.00)
GFR calc Af Amer: 23 mL/min — ABNORMAL LOW (ref >60)
GFR calc non Af Amer: 20 mL/min — ABNORMAL LOW (ref >60)
Glucose, Bld: 160 mg/dL — ABNORMAL HIGH (ref 70–99)
Potassium: 3 mmol/L — ABNORMAL LOW (ref 3.5–5.1)
Sodium: 139 mmol/L (ref 135–145)

## 2019-04-19 LAB — CBC
HCT: 35.8 % — ABNORMAL LOW (ref 36.0–46.0)
Hemoglobin: 11.8 g/dL — ABNORMAL LOW (ref 12.0–15.0)
MCH: 27.7 pg (ref 26.0–34.0)
MCHC: 33 g/dL (ref 30.0–36.0)
MCV: 84 fL (ref 80.0–100.0)
Platelets: 202 10*3/uL (ref 150–400)
RBC: 4.26 MIL/uL (ref 3.87–5.11)
RDW: 13.2 % (ref 11.5–15.5)
WBC: 8 10*3/uL (ref 4.0–10.5)
nRBC: 0 % (ref 0.0–0.2)

## 2019-04-19 LAB — GLUCOSE, CAPILLARY
Glucose-Capillary: 158 mg/dL — ABNORMAL HIGH (ref 70–99)
Glucose-Capillary: 249 mg/dL — ABNORMAL HIGH (ref 70–99)
Glucose-Capillary: 253 mg/dL — ABNORMAL HIGH (ref 70–99)
Glucose-Capillary: 266 mg/dL — ABNORMAL HIGH (ref 70–99)

## 2019-04-19 LAB — CULTURE, BLOOD (ROUTINE X 2)

## 2019-04-19 MED ORDER — INSULIN ASPART 100 UNIT/ML ~~LOC~~ SOLN
5.0000 [IU] | Freq: Three times a day (TID) | SUBCUTANEOUS | Status: DC
  Administered 2019-04-19 – 2019-04-20 (×5): 5 [IU] via SUBCUTANEOUS

## 2019-04-19 MED ORDER — INSULIN ASPART PROT & ASPART (70-30 MIX) 100 UNIT/ML ~~LOC~~ SUSP
85.0000 [IU] | Freq: Two times a day (BID) | SUBCUTANEOUS | Status: DC
  Administered 2019-04-19: 85 [IU] via SUBCUTANEOUS

## 2019-04-19 MED ORDER — INSULIN GLARGINE 100 UNIT/ML ~~LOC~~ SOLN
60.0000 [IU] | Freq: Two times a day (BID) | SUBCUTANEOUS | Status: DC
  Administered 2019-04-19 – 2019-04-23 (×8): 60 [IU] via SUBCUTANEOUS
  Filled 2019-04-19 (×10): qty 0.6

## 2019-04-19 NOTE — Progress Notes (Signed)
[facility]   Reason for visit: Follow up on Enterococcal sepsis  Antibiotics: Unasyn, day 2  Interval History: Pt evaluated by cardiology today and plan to perform TEE on Wednesday of this coming week. MRI of ankle/heel has not been ordered. Repeat blood cxs unrevealing thus far. Fever curve, WBC and CR trends, cx results, imaging and ABX usage all independently reviewed    Current Facility-Administered Medications:  .  acetaminophen (TYLENOL) tablet 650 mg, 650 mg, Oral, Q6H PRN, Irene Pap N, DO, 650 mg at 04/18/19 1214 .  Ampicillin-Sulbactam (UNASYN) 3 g in sodium chloride 0.9 % 100 mL IVPB, 3 g, Intravenous, Q12H, Hall, Carole N, DO, Last Rate: 200 mL/hr at 04/19/19 0936, 3 g at 04/19/19 0936 .  apixaban (ELIQUIS) tablet 5 mg, 5 mg, Oral, BID, Jonelle Sidle, Mohammad L, MD, 5 mg at 04/19/19 0930 .  calcium-vitamin D (OSCAL WITH D) 500-200 MG-UNIT per tablet 1 tablet, 1 tablet, Oral, 3 times weekly, Elwyn Reach, MD, 1 tablet at 04/19/19 0930 .  carvedilol (COREG) tablet 25 mg, 25 mg, Oral, BID, Garba, Mohammad L, MD, 25 mg at 04/19/19 0930 .  cholecalciferol (VITAMIN D3) tablet 2,000 Units, 2,000 Units, Oral, Daily, Gala Romney L, MD, 2,000 Units at 04/19/19 0930 .  fenofibrate tablet 54 mg, 54 mg, Oral, Daily, Jonelle Sidle, Mohammad L, MD, 54 mg at 04/19/19 0930 .  hydrALAZINE (APRESOLINE) tablet 10 mg, 10 mg, Oral, Q6H, Garba, Mohammad L, MD, 10 mg at 04/19/19 1239 .  insulin aspart (novoLOG) injection 0-20 Units, 0-20 Units, Subcutaneous, TID WC, Hall, Carole N, DO, 7 Units at 04/19/19 1239 .  insulin aspart (novoLOG) injection 0-5 Units, 0-5 Units, Subcutaneous, QHS, Kayleen Memos, DO, 5 Units at 04/18/19 2153 .  insulin aspart (novoLOG) injection 5 Units, 5 Units, Subcutaneous, TID WC, Hall, Carole N, DO, 5 Units at 04/19/19 1239 .  insulin glargine (LANTUS) injection 60 Units, 60 Units, Subcutaneous, BID, Hall, Carole N, DO .  meclizine (ANTIVERT)  tablet 12.5 mg, 12.5 mg, Oral, TID PRN, Jonelle Sidle, Mohammad L, MD .  mupirocin ointment (BACTROBAN) 2 % 1 application, 1 application, Topical, BID, Elwyn Reach, MD, 1 application at 70/17/79 0930 .  nutrition supplement (JUVEN) (JUVEN) powder packet 1 packet, 1 packet, Oral, BID BM, Kayleen Memos, DO, 1 packet at 04/19/19 1326 .  ondansetron (ZOFRAN) tablet 4 mg, 4 mg, Oral, Q6H PRN **OR** ondansetron (ZOFRAN) injection 4 mg, 4 mg, Intravenous, Q6H PRN, Garba, Mohammad L, MD .  polyvinyl alcohol (LIQUIFILM TEARS) 1.4 % ophthalmic solution 1 drop, 1 drop, Both Eyes, Daily PRN, Jonelle Sidle, Mohammad L, MD .  pravastatin (PRAVACHOL) tablet 40 mg, 40 mg, Oral, q1800, Gala Romney L, MD, 40 mg at 04/18/19 1759 .  prenatal vitamin w/FE, FA (PRENATAL 1 + 1) 27-1 MG tablet 1 tablet, 1 tablet, Oral, 3 times weekly, Elwyn Reach, MD, 1 tablet at 04/19/19 0930 .  protein supplement (ENSURE MAX) liquid, 11 oz, Oral, QHS, Hall, Carole N, DO, 11 oz at 04/18/19 0520 .  sodium chloride (OCEAN) 0.65 % nasal spray 1 spray, 1 spray, Each Nare, PRN, Jonelle Sidle, Mohammad L, MD .  torsemide (DEMADEX) tablet 20 mg, 20 mg, Oral, BID, Jonelle Sidle, Mohammad L, MD, 20 mg at 04/19/19 0848 .  vitamin E capsule 400 Units, 400 Units, Oral, 3 times weekly, Elwyn Reach, MD, 400 Units at 04/19/19 0930   Physical Exam:   Vitals:   04/18/19 2127 04/19/19 0507  BP: 110/88 Marland Kitchen)  125/37  Pulse: 75 68  Resp: 18 18  Temp: 98 F (36.7 C) 98.2 F (36.8 C)  SpO2: 97% 91%   Physical Exam Gen: obese, NAD, A&Ox 3 Head: NCAT, no temporal wasting evident EENT: PERRL, EOMI, MMM, adequate dentition Neck: supple, no JVD CV: NRRR, I/VI diastolic murmur at LUSB appreciated Pulm: CTA bilaterally, no wheeze or retractions Abd: soft, obese, well-healed midline incision, NTND, +BS Extrems:  trace LE edema, 2+ pulses MSK: half dollar sized ulcer at RT malleolus with overlying eschar making wound unstageable, + surrounding erythema but no  drainage evident Skin: chronic venous stasis to distal LEs, adequate skin turgor Neuro: CN II-XII grossly intact, no focal neurologic deficits appreciated, gait was not assessed, A&Ox 3  Review of Systems:  Review of Systems  Constitutional: Positive for malaise/fatigue. Negative for chills, fever and weight loss.  HENT: Negative for congestion, hearing loss, sinus pain and sore throat.   Eyes: Negative for blurred vision, photophobia and discharge.  Respiratory: Negative for cough, hemoptysis and shortness of breath.   Cardiovascular: Negative for chest pain, palpitations, orthopnea and leg swelling.  Gastrointestinal: Negative for abdominal pain, constipation, diarrhea, heartburn, nausea and vomiting.  Genitourinary: Negative for dysuria, flank pain, frequency and urgency.       +urinary incontinence x 7 months  Musculoskeletal: Positive for joint pain. Negative for back pain and myalgias.       RT ankle/heel pain  Skin: Negative for itching and rash.  Neurological: Positive for weakness. Negative for tremors, seizures and headaches.  Endo/Heme/Allergies: Negative for polydipsia. Does not bruise/bleed easily.  Psychiatric/Behavioral: Negative for depression and substance abuse. The patient is not nervous/anxious and does not have insomnia.      Lab Results  Component Value Date   WBC 8.0 04/19/2019   HGB 11.8 (L) 04/19/2019   HCT 35.8 (L) 04/19/2019   MCV 84.0 04/19/2019   PLT 202 04/19/2019    Lab Results  Component Value Date   CREATININE 2.39 (H) 04/19/2019   BUN 61 (H) 04/19/2019   NA 139 04/19/2019   K 3.0 (L) 04/19/2019   CL 99 04/19/2019   CO2 27 04/19/2019    Lab Results  Component Value Date   ALT 17 04/16/2019   AST 19 04/16/2019   ALKPHOS 74 04/16/2019     Microbiology: Recent Results (from the past 240 hour(s))  Blood Culture (routine x 2)     Status: Abnormal (Preliminary result)   Collection Time: 04/15/19  9:07 PM   Specimen: BLOOD  Result Value  Ref Range Status   Specimen Description BLOOD RIGHT ANTECUBITAL  Final   Special Requests   Final    BOTTLES DRAWN AEROBIC AND ANAEROBIC Blood Culture results may not be optimal due to an inadequate volume of blood received in culture bottles   Culture  Setup Time   Final    GRAM POSITIVE COCCI IN BOTH AEROBIC AND ANAEROBIC BOTTLES CRITICAL RESULT CALLED TO, READ BACK BY AND VERIFIED WITH: Rondel Oh PharmD 11:10 04/17/19 ([email or web address]) Performed at [facility], [place] [address] [place], [place] [address]    Culture ENTEROCOCCUS FAECALIS (A)  Final   Report Status PENDING  Incomplete   Organism ID, Bacteria ENTEROCOCCUS FAECALIS  Final      Susceptibility   Enterococcus faecalis - MIC*    AMPICILLIN <=2 SENSITIVE Sensitive     VANCOMYCIN 2 SENSITIVE Sensitive     GENTAMICIN SYNERGY SENSITIVE Sensitive     * ENTEROCOCCUS FAECALIS  Blood Culture  ID Panel (Reflexed)     Status: Abnormal   Collection Time: 04/15/19  9:07 PM  Result Value Ref Range Status   Enterococcus species DETECTED (A) NOT DETECTED Final    Comment: CRITICAL RESULT CALLED TO, READ BACK BY AND VERIFIED WITH: Rondel Oh PharmD 11:10 04/17/19 ([email or web address])    Vancomycin resistance NOT DETECTED NOT DETECTED Final   Listeria monocytogenes NOT DETECTED NOT DETECTED Final   Staphylococcus species NOT DETECTED NOT DETECTED Final   Staphylococcus aureus (BCID) NOT DETECTED NOT DETECTED Final   Streptococcus species NOT DETECTED NOT DETECTED Final   Streptococcus agalactiae NOT DETECTED NOT DETECTED Final   Streptococcus pneumoniae NOT DETECTED NOT DETECTED Final   Streptococcus pyogenes NOT DETECTED NOT DETECTED Final   Acinetobacter baumannii NOT DETECTED NOT DETECTED Final   Enterobacteriaceae species NOT DETECTED NOT DETECTED Final   Enterobacter cloacae complex NOT DETECTED NOT DETECTED Final   Escherichia coli NOT DETECTED NOT DETECTED Final   Klebsiella oxytoca NOT DETECTED NOT DETECTED Final   Klebsiella pneumoniae NOT  DETECTED NOT DETECTED Final   Proteus species NOT DETECTED NOT DETECTED Final   Serratia marcescens NOT DETECTED NOT DETECTED Final   Haemophilus influenzae NOT DETECTED NOT DETECTED Final   Neisseria meningitidis NOT DETECTED NOT DETECTED Final   Pseudomonas aeruginosa NOT DETECTED NOT DETECTED Final   Candida albicans NOT DETECTED NOT DETECTED Final   Candida glabrata NOT DETECTED NOT DETECTED Final   Candida krusei NOT DETECTED NOT DETECTED Final   Candida parapsilosis NOT DETECTED NOT DETECTED Final   Candida tropicalis NOT DETECTED NOT DETECTED Final    Comment: Performed at [facility], [address], [place], [place] [address]  Blood Culture (routine x 2)     Status: Abnormal   Collection Time: 04/15/19  9:08 PM   Specimen: BLOOD  Result Value Ref Range Status   Specimen Description BLOOD RIGHT WRIST  Final   Special Requests   Final    BOTTLES DRAWN AEROBIC AND ANAEROBIC Blood Culture results may not be optimal due to an inadequate volume of blood received in culture bottles   Culture  Setup Time   Final    GRAM POSITIVE COCCI IN BOTH AEROBIC AND ANAEROBIC BOTTLES CRITICAL VALUE NOTED.  VALUE IS CONSISTENT WITH PREVIOUSLY REPORTED AND CALLED VALUE.    Culture (A)  Final    ENTEROCOCCUS FAECALIS SUSCEPTIBILITIES PERFORMED ON PREVIOUS CULTURE WITHIN THE LAST 5 DAYS. Performed at [facility], [place] [address] [place], [place] [address]    Report Status 04/19/2019 FINAL  Final  Urine culture     Status: None   Collection Time: 04/15/19 11:11 PM   Specimen: In/Out Cath Urine  Result Value Ref Range Status   Specimen Description IN/OUT CATH URINE  Final   Special Requests NONE  Final   Culture   Final    NO GROWTH Performed at [facility], [place] [address], [place], [place] [address]    Report Status 04/17/2019 FINAL  Final  SARS Coronavirus 2 [facility] order, Performed in [facility] hospital lab) Nasopharyngeal Nasopharyngeal Swab     Status: None    Collection Time: 04/15/19 11:52 PM   Specimen: Nasopharyngeal Swab  Result Value Ref Range Status   SARS Coronavirus 2 NEGATIVE NEGATIVE Final    Comment: (NOTE) If result is NEGATIVE SARS-CoV-2 target nucleic acids are NOT DETECTED. The SARS-CoV-2 RNA is generally detectable in upper and lower  respiratory specimens during the acute phase of infection. The  lowest  concentration of SARS-CoV-2 viral copies this assay can detect is 250  copies / mL. A negative result does not preclude SARS-CoV-2 infection  and should not be used as the sole basis for treatment or other  patient management decisions.  A negative result may occur with  improper specimen collection / handling, submission of specimen other  than nasopharyngeal swab, presence of viral mutation(s) within the  areas targeted by this assay, and inadequate number of viral copies  (<250 copies / mL). A negative result must be combined with clinical  observations, patient history, and epidemiological information. If result is POSITIVE SARS-CoV-2 target nucleic acids are DETECTED. The SARS-CoV-2 RNA is generally detectable in upper and lower  respiratory specimens dur ing the acute phase of infection.  Positive  results are indicative of active infection with SARS-CoV-2.  Clinical  correlation with patient history and other diagnostic information is  necessary to determine patient infection status.  Positive results do  not rule out bacterial infection or co-infection with other viruses. If result is PRESUMPTIVE POSTIVE SARS-CoV-2 nucleic acids MAY BE PRESENT.   A presumptive positive result was obtained on the submitted specimen  and confirmed on repeat testing.  While 2019 novel coronavirus  (SARS-CoV-2) nucleic acids may be present in the submitted sample  additional confirmatory testing may be necessary for epidemiological  and / or clinical management purposes  to differentiate between  SARS-CoV-2 and other Sarbecovirus  currently known to infect humans.  If clinically indicated additional testing with an alternate test  methodology [phone]) is advised. The SARS-CoV-2 RNA is generally  detectable in upper and lower respiratory sp ecimens during the acute  phase of infection. The expected result is Negative. Fact Sheet for Patients:  StrictlyIdeas.no Fact Sheet for Healthcare Providers: BankingDealers.co.za This test is not yet approved or cleared by the [place] FDA and has been authorized for detection and/or diagnosis of SARS-CoV-2 by FDA under an Emergency Use Authorization (EUA).  This EUA will remain in effect (meaning this test can be used) for the duration of the COVID-19 declaration under Section 564(b)(1) of the Act, 21 U.S.C. section 360bbb-3(b)(1), unless the authorization is terminated or revoked sooner. Performed at [facility], [place] [address], [place], [place] [address]   Culture, blood (routine x 2)     Status: None (Preliminary result)   Collection Time: 04/17/19  3:15 PM   Specimen: BLOOD  Result Value Ref Range Status   Specimen Description BLOOD RIGHT ANTECUBITAL  Final   Special Requests   Final    BOTTLES DRAWN AEROBIC AND ANAEROBIC Blood Culture results may not be optimal due to an excessive volume of blood received in culture bottles   Culture   Final    NO GROWTH 2 DAYS Performed at [facility], [place] [address] [place], [place] [address]    Report Status PENDING  Incomplete  Culture, blood (routine x 2)     Status: None (Preliminary result)   Collection Time: 04/17/19  3:23 PM   Specimen: BLOOD  Result Value Ref Range Status   Specimen Description BLOOD BLOOD RIGHT FOREARM  Final   Special Requests   Final    BOTTLES DRAWN AEROBIC AND ANAEROBIC Blood Culture results may not be optimal due to an excessive volume of blood received in culture bottles   Culture   Final    NO GROWTH 2 DAYS Performed at [facility], [place] [address] [place], [place] [address]  Report Status PENDING  Incomplete    Impression/Plan: Patient is a 68 year old morbidly obese white female status post gastric bypass with stage III chronic kidney disease, h/o severe aortic stenosis, s/p TAVR and a chronic right heel ulcer admitted with malaise and subsequently found to have Enterococcal bacteremia.  1. Enterococcal sepsis -both of the patient's initial blood cultures are now positive for Enterococcus faecalis.  She was empirically started on vancomycin, rocephin, and Flagyl.  She had a reported allergy to penicillin which caused nausea and potentially emesis when she received an IM penicillin injection many years ago.  As her enterococcus is ampicillin sensitive, she was given a test dose of oral penicillin which she tolerated and then was transitioned to IV Unasyn instead. Will have pharmacy remove PCN as an allergy from the patient's chart.  F/u repeat blood cultures (thus far unrevealing) to establish clearance of her bacteremia.  Given her history of a TAVR, would proceed directly to transesophageal echocardiogram to best evaluate her TAVR apparatus and to rule out endocarditis.  This is tentatively planned for Wednesday of this coming week. Given the chronic nature of her right heel wound and poorly controlled diabetes, I am concerned that she is at rather high risk for subacute endocarditis.  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  It is unclear why she did not seek medical attention for this wound earlier.  Based on conversations with the patient it appears to be multifactorial as the COVID pandemic made access to medical care more challenging, she appears to have an element of peripheral neuropathy perhaps from DM and her gastric bypass history and subsequent vitamin deficiencies, and overall neglect/denial of her medical  comorbidities and infectious risks.  Baseline CRP here is 10.2. Continue unasyn for now. I have ordered an MRI of her RT ankle to evaluate for osteomyelitis. If this is confirmed, would consult orthopedics for debridement vs. amputation as calcaneal osteomyelitis is notorious to fail efforts to medically treat even with prolonged IV ABX and a continued open wound will place her at risk for relapsing bacteremias and potential seeding to her TAVR apparatus.  3. CRI - The patient's Cr baseline is unclear as she has ranged from 1.78 to 3.21 within the past 9 months, mostly with a trend of worsening in recent months. Her Cr today is 2.39, more consistent with stage IV renal disease. Difficult to say if this is true AoCRF, particularly as her DM has been mostly poorly controlled with ambulatory blood sugars in the mid 200s-low 300s in recent months per the patient.  I anticipate the patient will need some sort of long-term IV access for parenteral antibiotics, but the duration of this treatment is unknown at this time.  I worry she may need a PICC line if right heel osteomyelitis is confirmed as this often requires 8 weeks of IV antibiotic therapy to properly treat. I would have a low threshold for consulting nephrology if her Cr fails to improve as she is a medically tenuous patient from a renal standpoint.

## 2019-04-19 NOTE — Progress Notes (Signed)
    [facility] has been requested to perform a transesophageal echocardiogram on [name] for bactermia.  After careful review of history and examination, the risks and benefits of transesophageal echocardiogram have been explained including risks of esophageal damage, perforation (1:10,000 risk), bleeding, pharyngeal hematoma as well as other potential complications associated with conscious sedation including aspiration, arrhythmia, respiratory failure and death. Alternatives to treatment were discussed, questions were answered. Patient is willing to proceed.   Pt is scheduled for TEE on 04/24/19 at 9:00AM with Dr. Sallyanne Kuster. NPO at [facility] Tues night.  Tami Lin [name], [place]  04/19/2019 10:13 AM

## 2019-04-19 NOTE — Progress Notes (Signed)
Physical Therapy Treatment Patient Details Name: [name] MRN: [id number] DOB: 02/23/1951 Today's Date: 04/19/2019    History of Present Illness Pt is a 68 y/o female admitted secondary to generalized weakness, thought to be secondary to multifactoral cause. PMH includes obesity, CAD, CKD, DM, HTN, a fib, and s/p TAVR.     PT Comments    Pt was amenable to getting up and out in the hall.  Pt interested in sitting up in the chair today.  Supervision with RW in the room to the BR and min guard otherwise.   Follow Up Recommendations  Home health PT;Supervision/Assistance - 24 hour     Equipment Recommendations  None recommended by PT    Recommendations for Other Services       Precautions / Restrictions Precautions Precautions: Fall    Mobility  Bed Mobility Overal bed mobility: Needs Assistance Bed Mobility: Supine to Sit     Supine to sit: Supervision(use of the rail)        Transfers Overall transfer level: Needs assistance Equipment used: Rolling walker (2 wheeled) Transfers: Sit to/from Stand Sit to Stand: Min guard         General transfer comment: cues for better hand placement  Ambulation/Gait Ambulation/Gait assistance: Min guard Gait Distance (Feet): 90 Feet(x2  with sitting rest ) Assistive device: Rolling walker (2 wheeled) Gait Pattern/deviations: Step-through pattern Gait velocity: decreased   General Gait Details: generally steady with the RW, but still fatiguing easily.   Stairs             Wheelchair Mobility    Modified Rankin (Stroke Patients Only)       Balance Overall balance assessment: Needs assistance   Sitting balance-Leahy Scale: Good       Standing balance-Leahy Scale: Fair Standing balance comment: prefers relying on the RW                            Cognition Arousal/Alertness: Awake/alert Behavior During Therapy: [facility] for tasks assessed/performed Overall Cognitive Status: Within  Functional Limits for tasks assessed                                        Exercises      General Comments        Pertinent Vitals/Pain Pain Assessment: No/denies pain    Home Living                      Prior Function            PT Goals (current goals can now be found in the care plan section) Acute Rehab PT Goals PT Goal Formulation: With patient Time For Goal Achievement: 04/30/19 Potential to Achieve Goals: Good Progress towards PT goals: Progressing toward goals    Frequency    Min 3X/week      PT Plan Current plan remains appropriate    Co-evaluation              AM-PAC PT "6 Clicks" Mobility   Outcome Measure  Help needed turning from your back to your side while in a flat bed without using bedrails?: None Help needed moving from lying on your back to sitting on the side of a flat bed without using bedrails?: A Little Help needed moving to and from a bed to a chair (including a  wheelchair)?: A Little Help needed standing up from a chair using your arms (e.g., wheelchair or bedside chair)?: A Little Help needed to walk in hospital room?: A Little Help needed climbing 3-5 steps with a railing? : A Lot 6 Click Score: 18    End of Session   Activity Tolerance: Patient tolerated treatment well Patient left: in chair;with chair alarm set;with call bell/phone within reach Nurse Communication: Mobility status PT Visit Diagnosis: Muscle weakness (generalized) (M62.81);Other abnormalities of gait and mobility (R26.89)     Time: 5397-6734 PT Time Calculation (min) (ACUTE ONLY): 22 min  Charges:  $Gait Training: 8-22 mins                     04/19/2019  Donnella Sham, PT Acute Rehabilitation Services [phone]  (pager) [phone]  (office)   Tessie Fass [place] Jaquith 04/19/2019, 5:31 PM

## 2019-04-19 NOTE — Progress Notes (Signed)
Inpatient Diabetes Program Recommendations  AACE/ADA: New Consensus Statement on Inpatient Glycemic Control (2015)  Target Ranges:  Prepandial:   less than 140 mg/dL      Peak postprandial:   less than 180 mg/dL (1-2 hours)      Critically ill patients:  140 - 180 mg/dL   Lab Results  Component Value Date   GLUCAP 158 (H) 04/19/2019   HGBA1C 11.0 (H) 04/16/2019    Review of Glycemic Control Results for ELEXA, KIVI (MRN [id number]) as of 04/19/2019 11:24  Ref. Range 04/18/2019 11:51 04/18/2019 17:11 04/18/2019 21:23 04/19/2019 08:19  Glucose-Capillary Latest Ref Range: 70 - 99 mg/dL 377 (H) 438 (H) 351 (H) 158 (H)   Diabetes history:Type 2 DM Outpatient Diabetes medications:Novolog 70/30 75 (B)/60 (D) /60 (QHS) Current orders for Inpatient glycemic control:Novolog 70/30 85 units BID, Novolog 0-20 units TID, Novolog 0-5 units QHS  Inpatient Diabetes Program Recommendations:   Given increased trends post pranidally and that patient will remain inpatient until next week, consider changing:   -Levemir 60 units BID - Novolog 5 units TID (assuming patient is consuming >50% of meals).    Thanks, Bronson Curb, MSN, RNC-OB Diabetes Coordinator [phone] (8a-5p)

## 2019-04-19 NOTE — Progress Notes (Signed)
PROGRESS NOTE  [name] OJJ:[id number] DOB: 10-13-1950 DOA: 04/15/2019 PCP: [name], DO  HPI/Recap of past 24 hours: [name] is an 68 y.o. female with medical history significant ofatrial fibrillation, chronic kidney disease stage III, morbid obesity, GERD with esophageal stricture, hyperlipidemia, hypertension, IBS, osteoporosis who presented with significant weakness which is global. Patient said she has gradually felt weak unable to move around. She was on her bedside commode at home when leaned forward on her bed and slipped to the floor. She could not get up from the floor.   On presentation she was found to have right foot diabetic ulcer which she noted since February 2020.  Positive blood cultures x2/2. -enterococcus.  Reported allergy to penicillin.   04/19/19: Patient seen and examined at her bedside.  States she feels better today.  Intermittent dizziness with ambulation.  TEE planned on Tuesday.    Worsening hyperglycemia, increase 70/30 insulin dose.   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 disorder (HCC)   History of gastric bypass   Morbid obesity ([place])   Hypertension   Atrial fibrillation (HCC)   S/P TAVR (transcatheter aortic valve replacement)   SIRS (systemic inflammatory response syndrome) (HCC)   Pressure injury of skin   Infected wound  Generalized weakness/lethargy likely in the setting of active infective process, enterococcus bacteremia and right heel diabetic ulcer Presented with significant weakness, found to have right heel diabetic ulcer Continue to treat underlying condition  Enterococcus bacteremia, right heel diabetic ulcer likely the source Positive blood cultures collected on 04/15/2019 from both aerobic and anaerobic bottles grew  gram-positive cocci 2/2.- Enterococcus Reported allergy to penicillin Penicillin challenge completed No acute issues with Unasyn Repeated blood culture done on 04/17/2019 negative to date, continue to follow. TEE planned on Tuesday per cardiology Infectious disease following.  Status post TAVR Plan for TEE to rule out endocarditis Continue IV antibiotics F/u With Dr. Aundra Dubin Cardiology consulted for TEE  Right foot diabetic ulcer, POA Wound care specialist consulted Appreciate recommendations  x-ray no acute osseous abnormalities Elevated CRP 10.2, low sed rate 17 on 04/16/2019 Continue IV antibiotics Optimize pain control  Uncontrolled type 2 diabetes with hyperglycemia Hemoglobin A1c greater than 11 Increased dose of 70/30 to 85 unit twice daily Changed sliding scale to resistant Continue to monitor CBGs  Paroxysmal A. Fib Rate controlled Continue Eliquis for CVA prevention Continue to closely monitor on telemetry  Chronic diastolic CHF Continue strict I's and O's and daily weights  Ambulatory dysfunction/physical debility PT recommended home health PT Case manager consulted to assist with home health services.   DVT prophylaxis: eliquis Code Status: Full Code.  Family Communication:  None at bedside. Disposition Plan:  Possible DC to home when cardiology and infectious disease sign off.   Objective: Vitals:   04/18/19 0603 04/18/19 1455 04/18/19 2127 04/19/19 0507  BP: (!) 134/51 (!) 122/42 110/88 (!) 125/37  Pulse: 87 80 75 68  Resp: 19 18 18 18   Temp: 99.1 F (37.3 C) 98.1 F (36.7 C) 98 F (36.7 C) 98.2 F (36.8 C)  TempSrc: Oral Oral Oral Oral  SpO2: 90% 92% 97% 91%  Weight:      Height:        Intake/Output Summary (Last 24 hours) at 04/19/2019 9371 Last data filed at 04/18/2019 2310 Gross  per 24 hour  Intake -  Output 905 ml  Net -905 ml   Filed Weights   04/15/19 2353 04/16/19 0229  Weight: 97.1 kg 93.5 kg    Exam:  . General: 68 y.o.  year-old female obese in no acute distress.  Alert and oriented x3.   . Cardiovascular: Regular rate and rhythm no rubs or gallops.  No JVD or thyromegaly noted.   Marland Kitchen Respiratory: Clear to auscultation no wheezes or rales.  Poor inspiratory effort.   . Abdomen: Obese nontender nondistended normal bowel sounds present..  . Musculoskeletal: Trace lower extremity edema.  12 4 pulses in all 4 extremities.  Right heel with diabetic ulcer.   Marland Kitchen Psychiatry: Mood is appropriate for condition and setting.   Data Reviewed: CBC: Recent Labs  Lab 04/15/19 2053 04/16/19 0554 04/17/19 0210 04/19/19 0639  WBC 8.2 9.9 9.0 8.0  NEUTROABS 6.6  --   --   --   HGB 14.5 13.5 12.0 11.8*  HCT 44.7 41.6 36.5 35.8*  MCV 86.0 84.9 85.5 84.0  PLT 189 188 167 253   Basic Metabolic Panel: Recent Labs  Lab 04/15/19 2053 04/16/19 0554 04/17/19 0210 04/19/19 0639  NA 137 141 139 139  K 3.7 4.1 3.9 3.0*  CL 95* 105 103 99  CO2 27 24 26 27   GLUCOSE 249* 315* 132* 160*  BUN 38* 37* 40* 61*  CREATININE 2.33* 2.20* 2.30* 2.39*  CALCIUM 8.8* 8.5* 8.4* 8.6*   GFR: Estimated Creatinine Clearance: 26.5 mL/min (A) (by C-G formula based on SCr of 2.39 mg/dL (H)). Liver Function Tests: Recent Labs  Lab 04/15/19 2053 04/16/19 0554  AST 19 19  ALT 18 17  ALKPHOS 82 74  BILITOT 0.7 0.9  PROT 6.6 6.1*  ALBUMIN 3.3* 3.0*   Recent Labs  Lab 04/15/19 2053  LIPASE 44   No results for input(s): AMMONIA in the last 168 hours. Coagulation Profile: No results for input(s): INR, PROTIME in the last 168 hours. Cardiac Enzymes: No results for input(s): CKTOTAL, CKMB, CKMBINDEX, TROPONINI in the last 168 hours. BNP (last 3 results) No results for input(s): PROBNP in the last 8760 hours. HbA1C: No results for input(s): HGBA1C in the last 72 hours. CBG: Recent Labs  Lab 04/18/19 0748 04/18/19 1151 04/18/19 1711 04/18/19 2123 04/19/19 0819  GLUCAP 186* 377* 438* 351* 158*   Lipid Profile: No results for  input(s): CHOL, HDL, LDLCALC, TRIG, CHOLHDL, LDLDIRECT in the last 72 hours. Thyroid Function Tests: No results for input(s): TSH, T4TOTAL, FREET4, T3FREE, THYROIDAB in the last 72 hours. Anemia Panel: No results for input(s): VITAMINB12, FOLATE, FERRITIN, TIBC, IRON, RETICCTPCT in the last 72 hours. Urine analysis:    Component Value Date/Time   COLORURINE YELLOW 04/15/2019 2310   APPEARANCEUR CLEAR 04/15/2019 2310   APPEARANCEUR Clear 06/19/2018 1637   LABSPEC 1.012 04/15/2019 2310   PHURINE 7.0 04/15/2019 2310   GLUCOSEU >=500 (A) 04/15/2019 2310   HGBUR NEGATIVE 04/15/2019 2310   BILIRUBINUR NEGATIVE 04/15/2019 2310   BILIRUBINUR Negative 06/19/2018 1637   KETONESUR NEGATIVE 04/15/2019 2310   PROTEINUR 30 (A) 04/15/2019 2310   UROBILINOGEN 1.0 11/07/2014 2242   NITRITE NEGATIVE 04/15/2019 2310   LEUKOCYTESUR NEGATIVE 04/15/2019 2310   Sepsis Labs: @LABRCNTIP (procalcitonin:4,lacticidven:4)  ) Recent Results (from the past 240 hour(s))  Blood Culture (routine x 2)     Status: Abnormal (Preliminary result)   Collection Time: 04/15/19  9:07 PM   Specimen: BLOOD  Result Value Ref Range Status  Specimen Description BLOOD RIGHT ANTECUBITAL  Final   Special Requests   Final    BOTTLES DRAWN AEROBIC AND ANAEROBIC Blood Culture results may not be optimal due to an inadequate volume of blood received in culture bottles   Culture  Setup Time   Final    GRAM POSITIVE COCCI IN BOTH AEROBIC AND ANAEROBIC BOTTLES CRITICAL RESULT CALLED TO, READ BACK BY AND VERIFIED WITH: Rondel Oh PharmD 11:10 04/17/19 ([email or web address]) Performed at [facility], [place] [address], [place], [place] [address]    Culture ENTEROCOCCUS FAECALIS (A)  Final   Report Status PENDING  Incomplete   Organism ID, Bacteria ENTEROCOCCUS FAECALIS  Final      Susceptibility   Enterococcus faecalis - MIC*    AMPICILLIN <=2 SENSITIVE Sensitive     VANCOMYCIN 2 SENSITIVE Sensitive     GENTAMICIN SYNERGY SENSITIVE Sensitive      * ENTEROCOCCUS FAECALIS  Blood Culture ID Panel (Reflexed)     Status: Abnormal   Collection Time: 04/15/19  9:07 PM  Result Value Ref Range Status   Enterococcus species DETECTED (A) NOT DETECTED Final    Comment: CRITICAL RESULT CALLED TO, READ BACK BY AND VERIFIED WITH: Rondel Oh PharmD 11:10 04/17/19 ([email or web address])    Vancomycin resistance NOT DETECTED NOT DETECTED Final   Listeria monocytogenes NOT DETECTED NOT DETECTED Final   Staphylococcus species NOT DETECTED NOT DETECTED Final   Staphylococcus aureus (BCID) NOT DETECTED NOT DETECTED Final   Streptococcus species NOT DETECTED NOT DETECTED Final   Streptococcus agalactiae NOT DETECTED NOT DETECTED Final   Streptococcus pneumoniae NOT DETECTED NOT DETECTED Final   Streptococcus pyogenes NOT DETECTED NOT DETECTED Final   Acinetobacter baumannii NOT DETECTED NOT DETECTED Final   Enterobacteriaceae species NOT DETECTED NOT DETECTED Final   Enterobacter cloacae complex NOT DETECTED NOT DETECTED Final   Escherichia coli NOT DETECTED NOT DETECTED Final   Klebsiella oxytoca NOT DETECTED NOT DETECTED Final   Klebsiella pneumoniae NOT DETECTED NOT DETECTED Final   Proteus species NOT DETECTED NOT DETECTED Final   Serratia marcescens NOT DETECTED NOT DETECTED Final   Haemophilus influenzae NOT DETECTED NOT DETECTED Final   Neisseria meningitidis NOT DETECTED NOT DETECTED Final   Pseudomonas aeruginosa NOT DETECTED NOT DETECTED Final   Candida albicans NOT DETECTED NOT DETECTED Final   Candida glabrata NOT DETECTED NOT DETECTED Final   Candida krusei NOT DETECTED NOT DETECTED Final   Candida parapsilosis NOT DETECTED NOT DETECTED Final   Candida tropicalis NOT DETECTED NOT DETECTED Final    Comment: Performed at [facility], [place]. [address] [place], [place] [address]  Blood Culture (routine x 2)     Status: None (Preliminary result)   Collection Time: 04/15/19  9:08 PM   Specimen: BLOOD  Result Value Ref Range Status   Specimen  Description BLOOD RIGHT WRIST  Final   Special Requests   Final    BOTTLES DRAWN AEROBIC AND ANAEROBIC Blood Culture results may not be optimal due to an inadequate volume of blood received in culture bottles   Culture  Setup Time   Final    GRAM POSITIVE COCCI IN BOTH AEROBIC AND ANAEROBIC BOTTLES CRITICAL VALUE NOTED.  VALUE IS CONSISTENT WITH PREVIOUSLY REPORTED AND CALLED VALUE. Performed at [facility], [place] [address] [place],  [address]    Culture [facility] POSITIVE COCCI  Final   Report Status PENDING  Incomplete  Urine culture     Status: None   Collection Time:  04/15/19 11:11 PM   Specimen: In/Out Cath Urine  Result Value Ref Range Status   Specimen Description IN/OUT CATH URINE  Final   Special Requests NONE  Final   Culture   Final    NO GROWTH Performed at [facility], [address] [place], [place] [address]    Report Status 04/17/2019 FINAL  Final  SARS Coronavirus 2 [facility] order, Performed in [facility] hospital lab) Nasopharyngeal Nasopharyngeal Swab     Status: None   Collection Time: 04/15/19 11:52 PM   Specimen: Nasopharyngeal Swab  Result Value Ref Range Status   SARS Coronavirus 2 NEGATIVE NEGATIVE Final    Comment: (NOTE) If result is NEGATIVE SARS-CoV-2 target nucleic acids are NOT DETECTED. The SARS-CoV-2 RNA is generally detectable in upper and lower  respiratory specimens during the acute phase of infection. The lowest  concentration of SARS-CoV-2 viral copies this assay can detect is 250  copies / mL. A negative result does not preclude SARS-CoV-2 infection  and should not be used as the sole basis for treatment or other  patient management decisions.  A negative result may occur with  improper specimen collection / handling, submission of specimen other  than nasopharyngeal swab, presence of viral mutation(s) within the  areas targeted by this assay, and inadequate number of viral copies  (<250 copies / mL). A negative result  must be combined with clinical  observations, patient history, and epidemiological information. If result is POSITIVE SARS-CoV-2 target nucleic acids are DETECTED. The SARS-CoV-2 RNA is generally detectable in upper and lower  respiratory specimens dur ing the acute phase of infection.  Positive  results are indicative of active infection with SARS-CoV-2.  Clinical  correlation with patient history and other diagnostic information is  necessary to determine patient infection status.  Positive results do  not rule out bacterial infection or co-infection with other viruses. If result is PRESUMPTIVE POSTIVE SARS-CoV-2 nucleic acids MAY BE PRESENT.   A presumptive positive result was obtained on the submitted specimen  and confirmed on repeat testing.  While 2019 novel coronavirus  (SARS-CoV-2) nucleic acids may be present in the submitted sample  additional confirmatory testing may be necessary for epidemiological  and / or clinical management purposes  to differentiate between  SARS-CoV-2 and other Sarbecovirus currently known to infect humans.  If clinically indicated additional testing with an alternate test  methodology [phone]) is advised. The SARS-CoV-2 RNA is generally  detectable in upper and lower respiratory sp ecimens during the acute  phase of infection. The expected result is Negative. Fact Sheet for Patients:  StrictlyIdeas.no Fact Sheet for Healthcare Providers: BankingDealers.co.za This test is not yet approved or cleared by the [place] FDA and has been authorized for detection and/or diagnosis of SARS-CoV-2 by FDA under an Emergency Use Authorization (EUA).  This EUA will remain in effect (meaning this test can be used) for the duration of the COVID-19 declaration under Section 564(b)(1) of the Act, 21 U.S.C. section 360bbb-3(b)(1), unless the authorization is terminated or revoked sooner. Performed at [facility], [place] [address] [place], [place] [address]   Culture, blood (routine x 2)     Status: None (Preliminary result)   Collection Time: 04/17/19  3:15 PM   Specimen: BLOOD  Result Value Ref Range Status   Specimen Description BLOOD RIGHT ANTECUBITAL  Final   Special Requests   Final    BOTTLES DRAWN AEROBIC AND ANAEROBIC Blood Culture results may not be optimal  due to an excessive volume of blood received in culture bottles   Culture   Final    NO GROWTH < 24 HOURS Performed at [facility] [address] [place], [place] [address]    Report Status PENDING  Incomplete  Culture, blood (routine x 2)     Status: None (Preliminary result)   Collection Time: 04/17/19  3:23 PM   Specimen: BLOOD  Result Value Ref Range Status   Specimen Description BLOOD BLOOD RIGHT FOREARM  Final   Special Requests   Final    BOTTLES DRAWN AEROBIC AND ANAEROBIC Blood Culture results may not be optimal due to an excessive volume of blood received in culture bottles   Culture   Final    NO GROWTH < 24 HOURS Performed at [facility], [place] [address] [place], [place] [address]    Report Status PENDING  Incomplete      Studies: No results found.  Scheduled Meds: . apixaban  5 mg Oral BID  . calcium-vitamin D  1 tablet Oral 3 times weekly  . carvedilol  25 mg Oral BID  . cholecalciferol  2,000 Units Oral Daily  . fenofibrate  54 mg Oral Daily  . hydrALAZINE  10 mg Oral Q6H  . insulin aspart  0-20 Units Subcutaneous TID WC  . insulin aspart  0-5 Units Subcutaneous QHS  . insulin aspart protamine- aspart  85 Units Subcutaneous BID WC  . mupirocin ointment  1 application Topical BID  . nutrition supplement (JUVEN)  1 packet Oral BID BM  . pravastatin  40 mg Oral q1800  . prenatal vitamin w/FE, FA  1 tablet Oral 3 times weekly  . Ensure Max Protein  11 oz Oral QHS  . torsemide  20 mg Oral BID  . vitamin E  400 Units Oral 3 times weekly    Continuous Infusions: .  ampicillin-sulbactam (UNASYN) IV 3 g (04/18/19 2206)     LOS: 3 days     Kayleen Memos, MD [facility] Pager [phone]  If 7PM-7AM, please contact night-coverage [email or web address] Password [email or web address] 04/19/2019, 9:17 AM

## 2019-04-19 NOTE — Progress Notes (Signed)
Penicillin Allergy Assessment Progress Note   [name] is currently on Unasyn and tolerating it well. Given this information we will remove penicillin allergy from her allergy list. I have called and updated her pharmacy and PCP office with this information and they have removed penicillin allergy from their records as well.    [name] will be provided with a wallet card noting that she does not have a penicillin allergy and will be counseled that she should not add penicillin allergy back to her list.    Jimmy Footman, PharmD, BCPS, BCIDP Infectious Diseases Clinical Pharmacist Phone: [phone] 04/19/2019 10:15 AM

## 2019-04-19 NOTE — TOC Progression Note (Signed)
Transition of Care [facility]) - Progression Note    Patient Details  Name: [name] MRN: [id number] Date of Birth: Dec 09, 1950  [facility] CM/SW Contact  Jacalyn Lefevre Edson Snowball, RN Phone Number: 04/19/2019, 8:07 AM  Clinical Narrative:     Mateo Flow with [facility] has accepted referral for home health RN,PT,OT and aide.   Patient will have same HHPT she had before [facility] Pol[facility]   Expected Discharge Plan: [place] Barriers to Discharge: Continued Medical Work up  Expected Discharge Plan and Services Expected Discharge Plan: [place] In-house Referral: Clinical Social Work Discharge Planning Services: CM Consult Post Acute Care Choice: [place] arrangements for the past 2 months: Single Family Home                 DME Arranged: N/A         HH Arranged: PT           Social Determinants of Health (SDOH) Interventions    Readmission Risk Interventions Readmission Risk Prevention Plan 05/10/2018  Transportation Screening Complete  PCP or Specialist Appt within 5-7 Days Complete  Home Care Screening Complete  Medication Review (RN CM) Complete  Some recent data might be hidden

## 2019-04-19 NOTE — Care Management Important Message (Signed)
Important Message  Patient Details  Name: [name] MRN: [id number] Date of Birth: 05/30/1951   Medicare Important Message Given:  Yes     Memory Argue 04/19/2019, 3:20 PM

## 2019-04-20 LAB — GLUCOSE, CAPILLARY
Glucose-Capillary: 251 mg/dL — ABNORMAL HIGH (ref 70–99)
Glucose-Capillary: 330 mg/dL — ABNORMAL HIGH (ref 70–99)
Glucose-Capillary: 380 mg/dL — ABNORMAL HIGH (ref 70–99)
Glucose-Capillary: 376 mg/dL — ABNORMAL HIGH (ref 70–99)
Glucose-Capillary: 323 mg/dL — ABNORMAL HIGH (ref 70–99)

## 2019-04-20 LAB — BASIC METABOLIC PANEL
Anion gap: 15 (ref 5–15)
BUN: 82 mg/dL — ABNORMAL HIGH (ref 8–23)
CO2: 26 mmol/L (ref 22–32)
Calcium: 9 mg/dL (ref 8.9–10.3)
Chloride: 99 mmol/L (ref 98–111)
Creatinine, Ser: 2.41 mg/dL — ABNORMAL HIGH (ref 0.44–1.00)
GFR calc Af Amer: 23 mL/min — ABNORMAL LOW (ref >60)
GFR calc non Af Amer: 20 mL/min — ABNORMAL LOW (ref >60)
Glucose, Bld: 230 mg/dL — ABNORMAL HIGH (ref 70–99)
Potassium: 3.5 mmol/L (ref 3.5–5.1)
Sodium: 140 mmol/L (ref 135–145)

## 2019-04-20 NOTE — Plan of Care (Signed)
  Problem: Pain Managment: Goal: General experience of comfort will improve Outcome: Progressing   Problem: Safety: Goal: Ability to remain free from injury will improve Outcome: Progressing   Problem: Skin Integrity: Goal: Risk for impaired skin integrity will decrease Outcome: Progressing   

## 2019-04-20 NOTE — Progress Notes (Signed)
PROGRESS NOTE  [name] WLS:[id number] DOB: May 17, 1951 DOA: 04/15/2019 PCP: [name], DO  HPI/Recap of past 24 hours: [name] is an 68 y.o. female with medical history significant ofatrial fibrillation, chronic kidney disease stage III, morbid obesity, GERD with esophageal stricture, hyperlipidemia, hypertension, IBS, osteoporosis who presented with significant weakness which is global. Patient said she has gradually felt weak unable to move around. She was on her bedside commode at home when leaned forward on her bed and slipped to the floor. She could not get up from the floor.   On presentation she was found to have right foot diabetic ulcer which she noted since February 2020.  Positive blood cultures x2/2. -enterococcus.  Reported allergy to penicillin.   TEE planned on Wednesday, 04/24/2019.   04/20/19: Patient was seen and examined at bedside this morning.  No acute events overnight.  She has no new complaints.  Feels better after having a large bowel movement.   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 disorder (HCC)   History of gastric bypass   Morbid obesity ([place])   Hypertension   Atrial fibrillation (HCC)   S/P TAVR (transcatheter aortic valve replacement)   SIRS (systemic inflammatory response syndrome) (HCC)   Pressure injury of skin   Infected wound  Generalized weakness/lethargy likely in the setting of active infective process, enterococcus bacteremia and right heel diabetic ulcer Presented with significant weakness, found to have right heel diabetic ulcer Continue to treat underlying condition Continue PT OT Fall precautions  Enterococcus bacteremia 2/2 Positive blood cultures collected on 04/15/2019 from both aerobic and anaerobic bottles grew  gram-positive cocci 2/2.- Enterococcus  Pansensitive to ampicillin, gentamicin, vancomycin Reported allergy to penicillin Penicillin challenge completed No acute issues with Unasyn  No sign of acute allergic reaction to penicillin Repeated blood culture done on 04/17/2019 negative to date, continue to follow. TEE planned on Wednesday, 04/24/2019 per cardiology Infectious disease following. Continue Unasyn  Status post TAVR Plan for TEE to rule out endocarditis Continue IV antibiotics F/u With Dr. Aundra Dubin Cardiology consulted for TEE  Right foot diabetic ulcer, POA Wound care specialist consulted Appreciate recommendations  x-ray no acute osseous abnormalities Elevated CRP 10.2, low sed rate 17 on 04/16/2019 Continue IV antibiotics Optimize pain control  Peripheral artery disease Poor dorsalis pedis pulses and diminished on right She states she has an appointment to have her lower extremity vasculature evaluated  Uncontrolled type 2 diabetes with hyperglycemia Hemoglobin A1c greater than 11 Home insulin 70/30 Held off Started on Levemir 60 mg twice daily and NovoLog 5 units 3 times daily Continue sliding scale to resistant Continue to monitor CBGs  Paroxysmal A. Fib Rate controlled on Coreg Continue Eliquis for CVA prevention Continue to closely monitor on telemetry  Chronic diastolic CHF Continue strict I's and O's and daily weights  Ambulatory dysfunction/physical debility PT recommended home health PT Case manager consulted to assist with home health services. Continue PT OT Precautions   DVT prophylaxis: eliquis Code Status: Full Code.  Family Communication:  None at bedside. Disposition Plan:  Possible DC to home when cardiology and infectious disease sign off.   Objective: Vitals:   04/19/19 1500 04/19/19 2204 04/20/19 0501 04/20/19 0529  BP: (!) 124/56 105/70 (!) 122/45 (!) 112/48  Pulse: 70 79 66 64  Resp: 18 18  18   Temp: 98.5 F (36.9 C) 98.3 F (36.8  C) 98.4 F (36.9 C)   TempSrc: Oral Oral Oral   SpO2: 96% 93% 96%   Weight:      Height:       No intake or output data in the 24 hours ending 04/20/19 1411 Filed Weights   04/15/19 2353 04/16/19 0229  Weight: 97.1 kg 93.5 kg    Exam:  . General: 68 y.o. year-old female pleasant obese in no acute distress.  Alert and oriented x3.   . Cardiovascular: Regular rate and rhythm no rubs or gallops.  No JVD or thyromegaly noted. Marland Kitchen Respiratory: Clear to auscultation no wheezes or rales. Poor inspiratory effort.   . Abdomen: Obese nontender bowel movement present. . Musculoskeletal: Trace lower extremity edema.  No dorsalis pulses palpated on left lower extremity.  Diminished dorsalis pedis pulses on right lower extremity.  . Mood is appropriate for condition and setting.   Data Reviewed: CBC: Recent Labs  Lab 04/15/19 2053 04/16/19 0554 04/17/19 0210 04/19/19 0639  WBC 8.2 9.9 9.0 8.0  NEUTROABS 6.6  --   --   --   HGB 14.5 13.5 12.0 11.8*  HCT 44.7 41.6 36.5 35.8*  MCV 86.0 84.9 85.5 84.0  PLT 189 188 167 109   Basic Metabolic Panel: Recent Labs  Lab 04/15/19 2053 04/16/19 0554 04/17/19 0210 04/19/19 0639 04/20/19 0724  NA 137 141 139 139 140  K 3.7 4.1 3.9 3.0* 3.5  CL 95* 105 103 99 99  CO2 27 24 26 27 26   GLUCOSE 249* 315* 132* 160* 230*  BUN 38* 37* 40* 61* 82*  CREATININE 2.33* 2.20* 2.30* 2.39* 2.41*  CALCIUM 8.8* 8.5* 8.4* 8.6* 9.0   GFR: Estimated Creatinine Clearance: 26.2 mL/min (A) (by C-G formula based on SCr of 2.41 mg/dL (H)). Liver Function Tests: Recent Labs  Lab 04/15/19 2053 04/16/19 0554  AST 19 19  ALT 18 17  ALKPHOS 82 74  BILITOT 0.7 0.9  PROT 6.6 6.1*  ALBUMIN 3.3* 3.0*   Recent Labs  Lab 04/15/19 2053  LIPASE 44   No results for input(s): AMMONIA in the last 168 hours. Coagulation Profile: No results for input(s): INR, PROTIME in the last 168 hours. Cardiac Enzymes: No results for input(s): CKTOTAL, CKMB, CKMBINDEX,  TROPONINI in the last 168 hours. BNP (last 3 results) No results for input(s): PROBNP in the last 8760 hours. HbA1C: No results for input(s): HGBA1C in the last 72 hours. CBG: Recent Labs  Lab 04/19/19 1153 04/19/19 1730 04/19/19 2200 04/20/19 0831 04/20/19 1205  GLUCAP 249* 253* 266* 251* 330*   Lipid Profile: No results for input(s): CHOL, HDL, LDLCALC, TRIG, CHOLHDL, LDLDIRECT in the last 72 hours. Thyroid Function Tests: No results for input(s): TSH, T4TOTAL, FREET4, T3FREE, THYROIDAB in the last 72 hours. Anemia Panel: No results for input(s): VITAMINB12, FOLATE, FERRITIN, TIBC, IRON, RETICCTPCT in the last 72 hours. Urine analysis:    Component Value Date/Time   COLORURINE YELLOW 04/15/2019 2310   APPEARANCEUR CLEAR 04/15/2019 2310   APPEARANCEUR Clear 06/19/2018 1637   LABSPEC 1.012 04/15/2019 2310   PHURINE 7.0 04/15/2019 2310   GLUCOSEU >=500 (A) 04/15/2019 2310   HGBUR NEGATIVE 04/15/2019 2310   BILIRUBINUR NEGATIVE 04/15/2019 2310   BILIRUBINUR Negative 06/19/2018 1637   KETONESUR NEGATIVE 04/15/2019 2310   PROTEINUR 30 (A) 04/15/2019 2310   UROBILINOGEN 1.0 11/07/2014 2242   NITRITE NEGATIVE 04/15/2019 [place] 04/15/2019 2310  Sepsis Labs: @LABRCNTIP (procalcitonin:4,lacticidven:4)  ) Recent Results (from the past 240 hour(s))  Blood Culture (routine x 2)     Status: Abnormal (Preliminary result)   Collection Time: 04/15/19  9:07 PM   Specimen: BLOOD  Result Value Ref Range Status   Specimen Description BLOOD RIGHT ANTECUBITAL  Final   Special Requests   Final    BOTTLES DRAWN AEROBIC AND ANAEROBIC Blood Culture results may not be optimal due to an inadequate volume of blood received in culture bottles   Culture  Setup Time   Final    GRAM POSITIVE COCCI IN BOTH AEROBIC AND ANAEROBIC BOTTLES CRITICAL RESULT CALLED TO, READ BACK BY AND VERIFIED WITH: Rondel Oh PharmD 11:10 04/17/19 ([email or web address]) Performed at [facility], [place] [address] [place], [place] [address]    Culture ENTEROCOCCUS FAECALIS (A)  Final   Report Status PENDING  Incomplete   Organism ID, Bacteria ENTEROCOCCUS FAECALIS  Final      Susceptibility   Enterococcus faecalis - MIC*    AMPICILLIN <=2 SENSITIVE Sensitive     VANCOMYCIN 2 SENSITIVE Sensitive     GENTAMICIN SYNERGY SENSITIVE Sensitive     * ENTEROCOCCUS FAECALIS  Blood Culture ID Panel (Reflexed)     Status: Abnormal   Collection Time: 04/15/19  9:07 PM  Result Value Ref Range Status   Enterococcus species DETECTED (A) NOT DETECTED Final    Comment: CRITICAL RESULT CALLED TO, READ BACK BY AND VERIFIED WITH: Rondel Oh PharmD 11:10 04/17/19 ([email or web address])    Vancomycin resistance NOT DETECTED NOT DETECTED Final   Listeria monocytogenes NOT DETECTED NOT DETECTED Final   Staphylococcus species NOT DETECTED NOT DETECTED Final   Staphylococcus aureus (BCID) NOT DETECTED NOT DETECTED Final   Streptococcus species NOT DETECTED NOT DETECTED Final   Streptococcus agalactiae NOT DETECTED NOT DETECTED Final   Streptococcus pneumoniae NOT DETECTED NOT DETECTED Final   Streptococcus pyogenes NOT DETECTED NOT DETECTED Final   Acinetobacter baumannii NOT DETECTED NOT DETECTED Final   Enterobacteriaceae species NOT DETECTED NOT DETECTED Final   Enterobacter cloacae complex NOT DETECTED NOT DETECTED Final   Escherichia coli NOT DETECTED NOT DETECTED Final   Klebsiella oxytoca NOT DETECTED NOT DETECTED Final   Klebsiella pneumoniae NOT DETECTED NOT DETECTED Final   Proteus species NOT DETECTED NOT DETECTED Final   Serratia marcescens NOT DETECTED NOT DETECTED Final   Haemophilus influenzae NOT DETECTED NOT DETECTED Final   Neisseria meningitidis NOT DETECTED NOT DETECTED Final   Pseudomonas aeruginosa NOT DETECTED NOT DETECTED Final   Candida albicans NOT DETECTED NOT DETECTED Final   Candida glabrata NOT DETECTED NOT DETECTED Final   Candida krusei NOT DETECTED NOT DETECTED Final   Candida parapsilosis  NOT DETECTED NOT DETECTED Final   Candida tropicalis NOT DETECTED NOT DETECTED Final    Comment: Performed at [facility], [place]. [address], [place], [place] [address]  Blood Culture (routine x 2)     Status: Abnormal   Collection Time: 04/15/19  9:08 PM   Specimen: BLOOD  Result Value Ref Range Status   Specimen Description BLOOD RIGHT WRIST  Final   Special Requests   Final    BOTTLES DRAWN AEROBIC AND ANAEROBIC Blood Culture results may not be optimal due to an inadequate volume of blood received in culture bottles   Culture  Setup Time   Final    GRAM POSITIVE COCCI IN BOTH AEROBIC AND ANAEROBIC BOTTLES CRITICAL VALUE NOTED.  VALUE IS CONSISTENT WITH PREVIOUSLY REPORTED AND  CALLED VALUE.    Culture (A)  Final    ENTEROCOCCUS FAECALIS SUSCEPTIBILITIES PERFORMED ON PREVIOUS CULTURE WITHIN THE LAST 5 DAYS. Performed at [facility], [place] [address] [place], [place] [address]    Report Status 04/19/2019 FINAL  Final  Urine culture     Status: None   Collection Time: 04/15/19 11:11 PM   Specimen: In/Out Cath Urine  Result Value Ref Range Status   Specimen Description IN/OUT CATH URINE  Final   Special Requests NONE  Final   Culture   Final    NO GROWTH Performed at [facility], [place] [address] [place], [place] [address]    Report Status 04/17/2019 FINAL  Final  SARS Coronavirus 2 [facility] order, Performed in [facility] hospital lab) Nasopharyngeal Nasopharyngeal Swab     Status: None   Collection Time: 04/15/19 11:52 PM   Specimen: Nasopharyngeal Swab  Result Value Ref Range Status   SARS Coronavirus 2 NEGATIVE NEGATIVE Final    Comment: (NOTE) If result is NEGATIVE SARS-CoV-2 target nucleic acids are NOT DETECTED. The SARS-CoV-2 RNA is generally detectable in upper and lower  respiratory specimens during the acute phase of infection. The lowest  concentration of SARS-CoV-2 viral copies this assay can detect is 250  copies / mL. A negative result does not  preclude SARS-CoV-2 infection  and should not be used as the sole basis for treatment or other  patient management decisions.  A negative result may occur with  improper specimen collection / handling, submission of specimen other  than nasopharyngeal swab, presence of viral mutation(s) within the  areas targeted by this assay, and inadequate number of viral copies  (<250 copies / mL). A negative result must be combined with clinical  observations, patient history, and epidemiological information. If result is POSITIVE SARS-CoV-2 target nucleic acids are DETECTED. The SARS-CoV-2 RNA is generally detectable in upper and lower  respiratory specimens dur ing the acute phase of infection.  Positive  results are indicative of active infection with SARS-CoV-2.  Clinical  correlation with patient history and other diagnostic information is  necessary to determine patient infection status.  Positive results do  not rule out bacterial infection or co-infection with other viruses. If result is PRESUMPTIVE POSTIVE SARS-CoV-2 nucleic acids MAY BE PRESENT.   A presumptive positive result was obtained on the submitted specimen  and confirmed on repeat testing.  While 2019 novel coronavirus  (SARS-CoV-2) nucleic acids may be present in the submitted sample  additional confirmatory testing may be necessary for epidemiological  and / or clinical management purposes  to differentiate between  SARS-CoV-2 and other Sarbecovirus currently known to infect humans.  If clinically indicated additional testing with an alternate test  methodology [phone]) is advised. The SARS-CoV-2 RNA is generally  detectable in upper and lower respiratory sp ecimens during the acute  phase of infection. The expected result is Negative. Fact Sheet for Patients:  StrictlyIdeas.no Fact Sheet for Healthcare Providers: BankingDealers.co.za This test is not yet approved or cleared by  the [place] FDA and has been authorized for detection and/or diagnosis of SARS-CoV-2 by FDA under an Emergency Use Authorization (EUA).  This EUA will remain in effect (meaning this test can be used) for the duration of the COVID-19 declaration under Section 564(b)(1) of the Act, 21 U.S.C. section 360bbb-3(b)(1), unless the authorization is terminated or revoked sooner. Performed at [facility], [place] [address], [place], [place] [address]   Culture, blood (routine  x 2)     Status: None (Preliminary result)   Collection Time: 04/17/19  3:15 PM   Specimen: BLOOD  Result Value Ref Range Status   Specimen Description BLOOD RIGHT ANTECUBITAL  Final   Special Requests   Final    BOTTLES DRAWN AEROBIC AND ANAEROBIC Blood Culture results may not be optimal due to an excessive volume of blood received in culture bottles   Culture   Final    NO GROWTH 3 DAYS Performed at [facility], [place] [address] [place], [place] [address]    Report Status PENDING  Incomplete  Culture, blood (routine x 2)     Status: None (Preliminary result)   Collection Time: 04/17/19  3:23 PM   Specimen: BLOOD  Result Value Ref Range Status   Specimen Description BLOOD BLOOD RIGHT FOREARM  Final   Special Requests   Final    BOTTLES DRAWN AEROBIC AND ANAEROBIC Blood Culture results may not be optimal due to an excessive volume of blood received in culture bottles   Culture   Final    NO GROWTH 3 DAYS Performed at [facility], [place] [address], [place], [place] [address]    Report Status PENDING  Incomplete      Studies: No results found.  Scheduled Meds: . apixaban  5 mg Oral BID  . calcium-vitamin D  1 tablet Oral 3 times weekly  . carvedilol  25 mg Oral BID  . cholecalciferol  2,000 Units Oral Daily  . fenofibrate  54 mg Oral Daily  . hydrALAZINE  10 mg Oral Q6H  . insulin aspart  0-20 Units Subcutaneous TID WC  . insulin aspart  0-5 Units Subcutaneous QHS  . insulin aspart  5  Units Subcutaneous TID WC  . insulin glargine  60 Units Subcutaneous BID  . mupirocin ointment  1 application Topical BID  . nutrition supplement (JUVEN)  1 packet Oral BID BM  . pravastatin  40 mg Oral q1800  . prenatal vitamin w/FE, FA  1 tablet Oral 3 times weekly  . Ensure Max Protein  11 oz Oral QHS  . torsemide  20 mg Oral BID  . vitamin E  400 Units Oral 3 times weekly    Continuous Infusions: . ampicillin-sulbactam (UNASYN) IV 3 g (04/20/19 0949)     LOS: 4 days     Kayleen Memos, MD [facility] Pager [phone]  If 7PM-7AM, please contact night-coverage [email or web address] Password [email or web address] 04/20/2019, 2:11 PM

## 2019-04-20 NOTE — Progress Notes (Signed)
Pharmacy Antibiotic Note  [name] is a 68 y.o. female admitted on 04/15/2019 with weakness. Pt has history ov TAVR. She was found to have a suspected foot infection for which she was treated with vanc, ceftriaxone and metronidazole. Later blood cultures positive for Enterococcus. Patient passed penicillin allergy challenge this admission, and pharmacy has been consulted to dose Unasyn for enterococcal bacteremia and wound infection.  Last WBC WNL, afebrile, SCr trending up from admission to 2.41  Plan: Continue Unasyn 3 gm IV Q 12 hrs Monitor CBC, renal function, clinical improvement, repeat blood cx F/u TEE and MRI results for determining LOT and plans for OPAT   Height: 5\' 7"  (170.2 cm) Weight: 206 lb 2.1 oz (93.5 kg) IBW/kg (Calculated) : 61.6  Temp (24hrs), Avg:98.4 F (36.9 C), Min:98.3 F (36.8 C), Max:98.5 F (36.9 C)  Recent Labs  Lab 04/15/19 2049 04/15/19 2053 04/15/19 2351 04/16/19 0554 04/17/19 0210 04/19/19 0639 04/20/19 0724  WBC  --  8.2  --  9.9 9.0 8.0  --   CREATININE  --  2.33*  --  2.20* 2.30* 2.39* 2.41*  LATICACIDVEN 1.7  --  1.3  --   --   --   --     Estimated Creatinine Clearance: 26.2 mL/min (A) (by C-G formula based on SCr of 2.41 mg/dL (H)).    Allergies  Allergen Reactions  . Amlodipine Swelling    Ankle swelling  . Atorvastatin Other (See Comments)    Myalgias   . Crestor [Rosuvastatin Calcium] Other (See Comments)    Stiffness and back pain  . Ezetimibe-Simvastatin Other (See Comments)    Leg cramps  . Statins Other (See Comments)    Myalgias  . Celebrex [Celecoxib] Rash  . Levemir [Insulin Detemir] Rash    Antimicrobials this admission: 9/1 cefepime>>9/1 9/1 vanc >>9/2 9/1 Rocephin 9/2 9/1 Flagyl>>9/2 9/2 Unasyn>   Microbiology results: 8/31 Bcx: Enterococcus 2/2, amp and vanc sensitive 8/31 Ucx: NG/final 9/2 Bcx X 2: ngtd   Brendolyn Patty, PharmD PGY2 Pharmacy Resident Phone [phone]  04/20/2019   11:10  AM

## 2019-04-21 ENCOUNTER — Inpatient Hospital Stay (HOSPITAL_COMMUNITY): Payer: Medicare Other

## 2019-04-21 LAB — CULTURE, BLOOD (ROUTINE X 2)

## 2019-04-21 LAB — GLUCOSE, CAPILLARY
Glucose-Capillary: 232 mg/dL — ABNORMAL HIGH (ref 70–99)
Glucose-Capillary: 242 mg/dL — ABNORMAL HIGH (ref 70–99)
Glucose-Capillary: 248 mg/dL — ABNORMAL HIGH (ref 70–99)
Glucose-Capillary: 337 mg/dL — ABNORMAL HIGH (ref 70–99)

## 2019-04-21 MED ORDER — INSULIN ASPART 100 UNIT/ML ~~LOC~~ SOLN
8.0000 [IU] | Freq: Three times a day (TID) | SUBCUTANEOUS | Status: DC
  Administered 2019-04-21 – 2019-04-22 (×6): 8 [IU] via SUBCUTANEOUS

## 2019-04-21 NOTE — Progress Notes (Signed)
Dressing change to right heel completed. Moderate amount of purulent drainage noted. Iodine applied to 2nd and 3rd toe of left foot.

## 2019-04-21 NOTE — Plan of Care (Signed)
  Problem: Education: Goal: Knowledge of General Education information will improve Description: Including pain rating scale, medication(s)/side effects and non-pharmacologic comfort measures Outcome: Progressing   Problem: Clinical Measurements: Goal: Cardiovascular complication will be avoided Outcome: Progressing   Problem: Activity: Goal: Risk for activity intolerance will decrease Outcome: Progressing   Problem: Safety: Goal: Ability to remain free from injury will improve Outcome: Progressing   Problem: Skin Integrity: Goal: Risk for impaired skin integrity will decrease Outcome: Progressing

## 2019-04-21 NOTE — Progress Notes (Signed)
PROGRESS NOTE  [name] MWN:[id number] DOB: [birth year] DOA: 04/15/2019 PCP: [name], DO  HPI/Recap of past 24 hours: [name] is an 68 y.o. female with medical history significant ofatrial fibrillation, chronic kidney disease stage III, morbid obesity, GERD with esophageal stricture, hyperlipidemia, hypertension, IBS, osteoporosis who presented with significant weakness which is global. Patient said she has gradually felt weak unable to move around. She was on her bedside commode at home when leaned forward on her bed and slipped to the floor. She could not get up from the floor.   On presentation she was found to have right foot diabetic ulcer which she noted since February 2020.  Positive blood cultures x2/2. -enterococcus.  Reported allergy to penicillin.   TEE planned on Wednesday, 04/24/2019.   04/21/19: Patient was seen and examined at bedside with her husband present in the room.  No new complaints.  MRI right foot no evidence of osteomyelitis.   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 disorder (HCC)   History of gastric bypass   Morbid obesity ([place])   Hypertension   Atrial fibrillation (HCC)   S/P TAVR (transcatheter aortic valve replacement)   SIRS (systemic inflammatory response syndrome) (HCC)   Pressure injury of skin   Infected wound  Generalized weakness/lethargy likely in the setting of active infective process, enterococcus bacteremia and right heel diabetic ulcer Presented with significant weakness, found to have right heel diabetic ulcer Continue to treat underlying condition Continue PT OT Fall precautions  Enterococcus bacteremia 2/2 Positive blood cultures collected on 04/15/2019 from both aerobic and anaerobic bottles grew gram-positive cocci 2/2.-  Enterococcus  Pansensitive to ampicillin, gentamicin, vancomycin Reported allergy to penicillin Penicillin challenge completed No acute issues with Unasyn  No sign of acute allergic reaction to penicillin Repeated blood culture done on 04/17/2019 negative to date, continue to follow. TEE planned on Wednesday, 04/24/2019 per cardiology Infectious disease following. Continue Unasyn  Status post TAVR Plan for TEE to rule out endocarditis Continue IV antibiotics F/u With Dr. Aundra Dubin Cardiology consulted for TEE  Right foot diabetic ulcer, POA Wound care specialist consulted Appreciate recommendations  x-ray no acute osseous abnormalities Elevated CRP 10.2, low sed rate 17 on 04/16/2019 MRI right foot done on 04/21/2019 no evidence of osteomyelitis. Continue IV antibiotics Optimize pain control  Peripheral artery disease Poor dorsalis pedis pulses and diminished on right She states she has an appointment to have her lower extremity vasculature evaluated  Uncontrolled type 2 diabetes with hyperglycemia Hemoglobin A1c greater than 11 Home insulin 70/30 Held off Started on Levemir 60 mg twice daily and NovoLog 5 units 3 times daily Continue sliding scale to resistant Continue to monitor CBGs  Paroxysmal A. Fib Rate controlled on Coreg Continue Eliquis for CVA prevention Continue to closely monitor on telemetry  Chronic diastolic CHF Continue strict I's and O's and daily weights  Ambulatory dysfunction/physical debility PT recommended home health PT Case manager consulted to assist with home health services. Continue PT OT Precautions   DVT prophylaxis: eliquis Code Status: Full Code.  Family Communication:  None at bedside. Disposition Plan:  Possible DC to home when cardiology and infectious disease sign off.   Objective: Vitals:   04/21/19 0417 04/21/19 0832 04/21/19 1137 04/21/19 1356  BP: (!) 115/49 (!) 146/58 (!) 142/53 (!) 140/92  Pulse:  63 64 60 77  Resp:  17     Temp:    97.7 F (36.5 C)  TempSrc:    Oral  SpO2: 94% 96%  98%  Weight:      Height:        Intake/Output Summary (Last 24 hours) at 04/21/2019 1508 Last data filed at 04/21/2019 1352 Gross per 24 hour  Intake 400 ml  Output -  Net 400 ml   Filed Weights   04/15/19 2353 04/16/19 0229  Weight: 97.1 kg 93.5 kg    Exam:  . General: 68 y.o. year-old female pleasant obese in no acute distress.  Oriented x3.   . Cardiovascular: Regular rate and rhythm no rubs or gallops.  No JVD or thyromegaly noted . Respiratory: Clear to auscultation no wheezes or rales.  Good inspiratory effort.   . Abdomen: Obese nontender normal bowel sounds present. . Musculoskeletal: Trace lower extremity edema.  No dorsalis pulses palpated on left lower extremity.  Diminished dorsalis pedis pulses on right lower extremity.  . Mood is appropriate for condition and setting.   Data Reviewed: CBC: Recent Labs  Lab 04/15/19 2053 04/16/19 0554 04/17/19 0210 04/19/19 0639  WBC 8.2 9.9 9.0 8.0  NEUTROABS 6.6  --   --   --   HGB 14.5 13.5 12.0 11.8*  HCT 44.7 41.6 36.5 35.8*  MCV 86.0 84.9 85.5 84.0  PLT 189 188 167 010   Basic Metabolic Panel: Recent Labs  Lab 04/15/19 2053 04/16/19 0554 04/17/19 0210 04/19/19 0639 04/20/19 0724  NA 137 141 139 139 140  K 3.7 4.1 3.9 3.0* 3.5  CL 95* 105 103 99 99  CO2 27 24 26 27 26   GLUCOSE 249* 315* 132* 160* 230*  BUN 38* 37* 40* 61* 82*  CREATININE 2.33* 2.20* 2.30* 2.39* 2.41*  CALCIUM 8.8* 8.5* 8.4* 8.6* 9.0   GFR: Estimated Creatinine Clearance: 26.2 mL/min (A) (by C-G formula based on SCr of 2.41 mg/dL (H)). Liver Function Tests: Recent Labs  Lab 04/15/19 2053 04/16/19 0554  AST 19 19  ALT 18 17  ALKPHOS 82 74  BILITOT 0.7 0.9  PROT 6.6 6.1*  ALBUMIN 3.3* 3.0*   Recent Labs  Lab 04/15/19 2053  LIPASE 44   No results for input(s): AMMONIA in the last 168 hours. Coagulation Profile: No results for input(s): INR, PROTIME in the last 168  hours. Cardiac Enzymes: No results for input(s): CKTOTAL, CKMB, CKMBINDEX, TROPONINI in the last 168 hours. BNP (last 3 results) No results for input(s): PROBNP in the last 8760 hours. HbA1C: No results for input(s): HGBA1C in the last 72 hours. CBG: Recent Labs  Lab 04/20/19 1735 04/20/19 2005 04/20/19 2211 04/21/19 0800 04/21/19 1223  GLUCAP 380* 376* 323* 232* 242*   Lipid Profile: No results for input(s): CHOL, HDL, LDLCALC, TRIG, CHOLHDL, LDLDIRECT in the last 72 hours. Thyroid Function Tests: No results for input(s): TSH, T4TOTAL, FREET4, T3FREE, THYROIDAB in the last 72 hours. Anemia Panel: No results for input(s): VITAMINB12, FOLATE, FERRITIN, TIBC, IRON, RETICCTPCT in the last 72 hours. Urine analysis:    Component Value Date/Time   COLORURINE YELLOW 04/15/2019 2310   APPEARANCEUR CLEAR 04/15/2019 2310   APPEARANCEUR Clear 06/19/2018 1637   LABSPEC 1.012 04/15/2019 2310   PHURINE 7.0 04/15/2019 2310   GLUCOSEU >=500 (A) 04/15/2019 2310   HGBUR NEGATIVE 04/15/2019 2310   BILIRUBINUR NEGATIVE 04/15/2019 2310   BILIRUBINUR Negative 06/19/2018 [place] 04/15/2019 2310   PROTEINUR 30 (A)  04/15/2019 2310   UROBILINOGEN 1.0 11/07/2014 2242   NITRITE NEGATIVE 04/15/2019 2310   LEUKOCYTESUR NEGATIVE 04/15/2019 2310   Sepsis Labs: @LABRCNTIP (procalcitonin:4,lacticidven:4)  ) Recent Results (from the past 240 hour(s))  Blood Culture (routine x 2)     Status: Abnormal (Preliminary result)   Collection Time: 04/15/19  9:07 PM   Specimen: BLOOD  Result Value Ref Range Status   Specimen Description BLOOD RIGHT ANTECUBITAL  Final   Special Requests   Final    BOTTLES DRAWN AEROBIC AND ANAEROBIC Blood Culture results may not be optimal due to an inadequate volume of blood received in culture bottles   Culture  Setup Time   Final    GRAM POSITIVE COCCI IN BOTH AEROBIC AND ANAEROBIC BOTTLES CRITICAL RESULT CALLED TO, READ BACK BY AND VERIFIED WITH: Rondel Oh PharmD 11:10 04/17/19 ([email or web address]) Performed at [facility], [place] [address], [place], [place] [address]    Culture ENTEROCOCCUS FAECALIS (A)  Final   Report Status PENDING  Incomplete   Organism ID, Bacteria ENTEROCOCCUS FAECALIS  Final      Susceptibility   Enterococcus faecalis - MIC*    AMPICILLIN <=2 SENSITIVE Sensitive     VANCOMYCIN 2 SENSITIVE Sensitive     GENTAMICIN SYNERGY SENSITIVE Sensitive     * ENTEROCOCCUS FAECALIS  Blood Culture ID Panel (Reflexed)     Status: Abnormal   Collection Time: 04/15/19  9:07 PM  Result Value Ref Range Status   Enterococcus species DETECTED (A) NOT DETECTED Final    Comment: CRITICAL RESULT CALLED TO, READ BACK BY AND VERIFIED WITH: Rondel Oh PharmD 11:10 04/17/19 ([email or web address])    Vancomycin resistance NOT DETECTED NOT DETECTED Final   Listeria monocytogenes NOT DETECTED NOT DETECTED Final   Staphylococcus species NOT DETECTED NOT DETECTED Final   Staphylococcus aureus (BCID) NOT DETECTED NOT DETECTED Final   Streptococcus species NOT DETECTED NOT DETECTED Final   Streptococcus agalactiae NOT DETECTED NOT DETECTED Final   Streptococcus pneumoniae NOT DETECTED NOT DETECTED Final   Streptococcus pyogenes NOT DETECTED NOT DETECTED Final   Acinetobacter baumannii NOT DETECTED NOT DETECTED Final   Enterobacteriaceae species NOT DETECTED NOT DETECTED Final   Enterobacter cloacae complex NOT DETECTED NOT DETECTED Final   Escherichia coli NOT DETECTED NOT DETECTED Final   Klebsiella oxytoca NOT DETECTED NOT DETECTED Final   Klebsiella pneumoniae NOT DETECTED NOT DETECTED Final   Proteus species NOT DETECTED NOT DETECTED Final   Serratia marcescens NOT DETECTED NOT DETECTED Final   Haemophilus influenzae NOT DETECTED NOT DETECTED Final   Neisseria meningitidis NOT DETECTED NOT DETECTED Final   Pseudomonas aeruginosa NOT DETECTED NOT DETECTED Final   Candida albicans NOT DETECTED NOT DETECTED Final   Candida glabrata NOT DETECTED NOT DETECTED  Final   Candida krusei NOT DETECTED NOT DETECTED Final   Candida parapsilosis NOT DETECTED NOT DETECTED Final   Candida tropicalis NOT DETECTED NOT DETECTED Final    Comment: Performed at [facility], [place]. [address] [place], [place] [address]  Blood Culture (routine x 2)     Status: Abnormal   Collection Time: 04/15/19  9:08 PM   Specimen: BLOOD  Result Value Ref Range Status   Specimen Description BLOOD RIGHT WRIST  Final   Special Requests   Final    BOTTLES DRAWN AEROBIC AND ANAEROBIC Blood Culture results may not be optimal due to an inadequate volume of blood received in culture bottles   Culture  Setup Time   Final  GRAM POSITIVE COCCI IN BOTH AEROBIC AND ANAEROBIC BOTTLES CRITICAL VALUE NOTED.  VALUE IS CONSISTENT WITH PREVIOUSLY REPORTED AND CALLED VALUE.    Culture (A)  Final    ENTEROCOCCUS FAECALIS SUSCEPTIBILITIES PERFORMED ON PREVIOUS CULTURE WITHIN THE LAST 5 DAYS. Performed at [facility], [place] [address] [place], [place] [address]    Report Status 04/19/2019 FINAL  Final  Urine culture     Status: None   Collection Time: 04/15/19 11:11 PM   Specimen: In/Out Cath Urine  Result Value Ref Range Status   Specimen Description IN/OUT CATH URINE  Final   Special Requests NONE  Final   Culture   Final    NO GROWTH Performed at [facility], [place] [address] [place], [place] [address]    Report Status 04/17/2019 FINAL  Final  SARS Coronavirus 2 [facility] order, Performed in [facility] hospital lab) Nasopharyngeal Nasopharyngeal Swab     Status: None   Collection Time: 04/15/19 11:52 PM   Specimen: Nasopharyngeal Swab  Result Value Ref Range Status   SARS Coronavirus 2 NEGATIVE NEGATIVE Final    Comment: (NOTE) If result is NEGATIVE SARS-CoV-2 target nucleic acids are NOT DETECTED. The SARS-CoV-2 RNA is generally detectable in upper and lower  respiratory specimens during the acute phase of infection. The lowest  concentration of SARS-CoV-2  viral copies this assay can detect is 250  copies / mL. A negative result does not preclude SARS-CoV-2 infection  and should not be used as the sole basis for treatment or other  patient management decisions.  A negative result may occur with  improper specimen collection / handling, submission of specimen other  than nasopharyngeal swab, presence of viral mutation(s) within the  areas targeted by this assay, and inadequate number of viral copies  (<250 copies / mL). A negative result must be combined with clinical  observations, patient history, and epidemiological information. If result is POSITIVE SARS-CoV-2 target nucleic acids are DETECTED. The SARS-CoV-2 RNA is generally detectable in upper and lower  respiratory specimens dur ing the acute phase of infection.  Positive  results are indicative of active infection with SARS-CoV-2.  Clinical  correlation with patient history and other diagnostic information is  necessary to determine patient infection status.  Positive results do  not rule out bacterial infection or co-infection with other viruses. If result is PRESUMPTIVE POSTIVE SARS-CoV-2 nucleic acids MAY BE PRESENT.   A presumptive positive result was obtained on the submitted specimen  and confirmed on repeat testing.  While 2019 novel coronavirus  (SARS-CoV-2) nucleic acids may be present in the submitted sample  additional confirmatory testing may be necessary for epidemiological  and / or clinical management purposes  to differentiate between  SARS-CoV-2 and other Sarbecovirus currently known to infect humans.  If clinically indicated additional testing with an alternate test  methodology [phone]) is advised. The SARS-CoV-2 RNA is generally  detectable in upper and lower respiratory sp ecimens during the acute  phase of infection. The expected result is Negative. Fact Sheet for Patients:  StrictlyIdeas.no Fact Sheet for Healthcare Providers:  BankingDealers.co.za This test is not yet approved or cleared by the [place] FDA and has been authorized for detection and/or diagnosis of SARS-CoV-2 by FDA under an Emergency Use Authorization (EUA).  This EUA will remain in effect (meaning this test can be used) for the duration of the COVID-19 declaration under Section 564(b)(1) of the Act, 21 U.S.C. section 360bbb-3(b)(1), unless the authorization is terminated or  revoked sooner. Performed at [facility], [place] [address] [place], [place] [address]   Culture, blood (routine x 2)     Status: None (Preliminary result)   Collection Time: 04/17/19  3:15 PM   Specimen: BLOOD  Result Value Ref Range Status   Specimen Description BLOOD RIGHT ANTECUBITAL  Final   Special Requests   Final    BOTTLES DRAWN AEROBIC AND ANAEROBIC Blood Culture results may not be optimal due to an excessive volume of blood received in culture bottles   Culture   Final    NO GROWTH 4 DAYS Performed at [facility], [place] [address], [place], [place] [address]    Report Status PENDING  Incomplete  Culture, blood (routine x 2)     Status: None (Preliminary result)   Collection Time: 04/17/19  3:23 PM   Specimen: BLOOD  Result Value Ref Range Status   Specimen Description BLOOD BLOOD RIGHT FOREARM  Final   Special Requests   Final    BOTTLES DRAWN AEROBIC AND ANAEROBIC Blood Culture results may not be optimal due to an excessive volume of blood received in culture bottles   Culture   Final    NO GROWTH 4 DAYS Performed at [facility], [place] [address], [place], [place] [address]    Report Status PENDING  Incomplete      Studies: Mr Heel Right Wo Contrast  Result Date: 04/21/2019 CLINICAL DATA:  Chronic heel ulcer, bacteremia EXAM: MR OF THE RIGHT HEEL WITHOUT CONTRAST TECHNIQUE: Multiplanar, multisequence MR imaging of the right hindfoot was performed. No intravenous contrast was administered. COMPARISON:  X-ray  04/15/2019 FINDINGS: Technical note: Examination significantly degraded by patient motion artifact despite multiple attempts at repeat sequences. Best possible images were submitted for interpretation. Bones/Joint/Cartilage Focal area of mild bone marrow edema involving the lateral aspect of the posterior calcaneus at the Achilles tendon insertion (series 4, images 16-17). No confluent low T1 signal changes are appreciated in this area. No overlying soft tissue edema or fluid. Mild subchondral marrow signal changes at the lateral talar shoulder, likely degenerative. Overlying cartilage appears irregular, poorly evaluated. Marrow signal elsewhere within the hindfoot appears within normal limits. No tibiotalar or subtalar joint effusion. Ligaments Poorly evaluated, grossly intact. Muscles and Tendons Mild increased T2 signal within the intrinsic musculature at the visualized forefoot, likely nonspecific myositis. There is mild fatty infiltration of the musculature. Tendons are grossly intact without tenosynovitis. Soft tissues Edema within Kager's fat. Mild soft tissue edema overlies the lateral malleolus. No soft tissue fluid collections. IMPRESSION: 1. Limited exam. 2. Mild edema-like marrow signal changes within the posterolateral aspect of the calcaneus adjacent to the Achilles tendon insertion. Findings could be reactive or stress related. There is no associated confluent low T1 marrow signal or overlying soft tissue changes to suggest acute osteomyelitis at this site. 3. Mild intramuscular edema and fatty infiltration which may represent denervation changes versus myositis. Electronically Signed   By: Davina Poke M.D.   On: 04/21/2019 10:35    Scheduled Meds: . apixaban  5 mg Oral BID  . calcium-vitamin D  1 tablet Oral 3 times weekly  . carvedilol  25 mg Oral BID  . cholecalciferol  2,000 Units Oral Daily  . fenofibrate  54 mg Oral Daily  . hydrALAZINE  10 mg Oral Q6H  . insulin aspart  0-20  Units Subcutaneous TID WC  . insulin aspart  0-5 Units Subcutaneous QHS  . insulin aspart  8  Units Subcutaneous TID WC  . insulin glargine  60 Units Subcutaneous BID  . mupirocin ointment  1 application Topical BID  . nutrition supplement (JUVEN)  1 packet Oral BID BM  . pravastatin  40 mg Oral q1800  . prenatal vitamin w/FE, FA  1 tablet Oral 3 times weekly  . Ensure Max Protein  11 oz Oral QHS  . torsemide  20 mg Oral BID  . vitamin E  400 Units Oral 3 times weekly    Continuous Infusions: . ampicillin-sulbactam (UNASYN) IV 3 g (04/21/19 1031)     LOS: 5 days     Kayleen Memos, MD [facility] Pager [phone]  If 7PM-7AM, please contact night-coverage [email or web address] Password [email or web address] 04/21/2019, 3:08 PM

## 2019-04-22 LAB — BASIC METABOLIC PANEL
Anion gap: 14 (ref 5–15)
BUN: 93 mg/dL — ABNORMAL HIGH (ref 8–23)
CO2: 28 mmol/L (ref 22–32)
Calcium: 8.9 mg/dL (ref 8.9–10.3)
Chloride: 97 mmol/L — ABNORMAL LOW (ref 98–111)
Creatinine, Ser: 2.67 mg/dL — ABNORMAL HIGH (ref 0.44–1.00)
GFR calc Af Amer: 20 mL/min — ABNORMAL LOW (ref >60)
GFR calc non Af Amer: 18 mL/min — ABNORMAL LOW (ref >60)
Glucose, Bld: 199 mg/dL — ABNORMAL HIGH (ref 70–99)
Potassium: 3.1 mmol/L — ABNORMAL LOW (ref 3.5–5.1)
Sodium: 139 mmol/L (ref 135–145)

## 2019-04-22 LAB — CULTURE, BLOOD (ROUTINE X 2)
Culture: NO GROWTH
Culture: NO GROWTH

## 2019-04-22 LAB — GLUCOSE, CAPILLARY
Glucose-Capillary: 201 mg/dL — ABNORMAL HIGH (ref 70–99)
Glucose-Capillary: 213 mg/dL — ABNORMAL HIGH (ref 70–99)
Glucose-Capillary: 239 mg/dL — ABNORMAL HIGH (ref 70–99)
Glucose-Capillary: 352 mg/dL — ABNORMAL HIGH (ref 70–99)

## 2019-04-22 MED ORDER — POTASSIUM CHLORIDE CRYS ER 20 MEQ PO TBCR
40.0000 meq | EXTENDED_RELEASE_TABLET | Freq: Once | ORAL | Status: AC
  Administered 2019-04-22: 40 meq via ORAL
  Filled 2019-04-22: qty 2

## 2019-04-22 MED ORDER — ALIROCUMAB 75 MG/ML ~~LOC~~ SOAJ: 75.0000 mg | SUBCUTANEOUS | Status: DC

## 2019-04-22 NOTE — Plan of Care (Signed)
  Problem: Education: Goal: Knowledge of General Education information will improve Description: Including pain rating scale, medication(s)/side effects and non-pharmacologic comfort measures Outcome: Progressing   Problem: Health Behavior/Discharge Planning: Goal: Ability to manage health-related needs will improve Outcome: Progressing   Problem: Activity: Goal: Risk for activity intolerance will decrease Outcome: Progressing   Problem: Skin Integrity: Goal: Risk for impaired skin integrity will decrease Outcome: Progressing   

## 2019-04-22 NOTE — Progress Notes (Signed)
Physical Therapy Treatment Patient Details Name: [name] MRN: [id number] DOB: 15-Feb-1951 Today's Date: 04/22/2019    History of Present Illness Pt is a 68 y/o female admitted secondary to generalized weakness, thought to be secondary to multifactoral cause. PMH includes obesity, CAD, CKD, DM, HTN, a fib, and s/p TAVR.     PT Comments    Pt progressing with functional mobility, able to tolerate increased distance with gait training and LE strengthening exercises this session. Pt continues to fatigue quickly, requiring seated rest breaks between bouts of gait secondary to decreased overall endurance. Pt would continue to benefit from home health follow up therapy upon d/c in order to maximize functional independence with activity.  Follow Up Recommendations  Home health PT;Supervision/Assistance - 24 hour     Equipment Recommendations  None recommended by PT    Recommendations for Other Services       Precautions / Restrictions Precautions Precautions: Fall Restrictions Weight Bearing Restrictions: No    Mobility  Bed Mobility               General bed mobility comments: Pt already in recliner upon PT arrival  Transfers Overall transfer level: Needs assistance Equipment used: Rolling walker (2 wheeled) Transfers: Sit to/from Stand Sit to Stand: Supervision         General transfer comment: cues for better hand placement  Ambulation/Gait Ambulation/Gait assistance: Min guard Gait Distance (Feet): 60 Feet Assistive device: Rolling walker (2 wheeled) Gait Pattern/deviations: Step-through pattern Gait velocity: decreased Gait velocity interpretation: <1.31 ft/sec, indicative of household ambulator General Gait Details: generally steady with the RW, overall with decreased endurance. Pt ambulated 2 x 60 ft this session with seated rest break between bouts secondary to fatigue.    Modified Rankin (Stroke Patients Only)       Balance Overall balance  assessment: Needs assistance Sitting-balance support: No upper extremity supported;Feet supported Sitting balance-Leahy Scale: Good     Standing balance support: No upper extremity supported;During functional activity Standing balance-Leahy Scale: Poor Standing balance comment: static standing without UE support and supervision. RW used for dynamic balance with ambulation, min guard.                            Cognition Arousal/Alertness: Awake/alert Behavior During Therapy: WFL for tasks assessed/performed Overall Cognitive Status: Within Functional Limits for tasks assessed                                        Exercises General Exercises - Lower Extremity Long Arc Quad: Strengthening;Both;20 reps;Seated Hip ABduction/ADduction: Strengthening;Standing;5 reps;Both Toe Raises: Strengthening;10 reps;Standing;Both    General Comments General comments (skin integrity, edema, etc.): Husband present in the room at start of session, updated him on pt's current functional level      Pertinent Vitals/Pain Pain Assessment: No/denies pain    Home Living                      Prior Function            PT Goals (current goals can now be found in the care plan section) Progress towards PT goals: Progressing toward goals    Frequency    Min 3X/week      PT Plan Current plan remains appropriate    Co-evaluation  AM-PAC PT "6 Clicks" Mobility   Outcome Measure  Help needed turning from your back to your side while in a flat bed without using bedrails?: None Help needed moving from lying on your back to sitting on the side of a flat bed without using bedrails?: A Little Help needed moving to and from a bed to a chair (including a wheelchair)?: A Little Help needed standing up from a chair using your arms (e.g., wheelchair or bedside chair)?: A Little Help needed to walk in hospital room?: A Little Help needed climbing 3-5  steps with a railing? : A Lot 6 Click Score: 18    End of Session Equipment Utilized During Treatment: Gait belt Activity Tolerance: Patient tolerated treatment well Patient left: in chair;with chair alarm set;with call bell/phone within reach Nurse Communication: Mobility status PT Visit Diagnosis: Muscle weakness (generalized) (M62.81);Other abnormalities of gait and mobility (R26.89)     Time: 6578-4696 PT Time Calculation (min) (ACUTE ONLY): 24 min  Charges:  $Gait Training: 8-22 mins $Therapeutic Exercise: 8-22 mins                     Netta Corrigan, PT, DPT Acute Rehab Office [place] 04/22/2019, 4:47 PM

## 2019-04-22 NOTE — Evaluation (Signed)
Occupational Therapy Evaluation and Discharge Patient Details Name: [name] MRN: [id number] DOB: October 17, 1950 Today's Date: 04/22/2019    History of Present Illness Pt is a 68 y/o female admitted secondary to generalized weakness, thought to be secondary to multifactoral cause. PMH includes obesity, CAD, CKD, DM, HTN, a fib, and s/p TAVR.    Clinical Impression   This 68 yo female admitted with above presents to acute OT at a setup/S level except for LBD--husband A'd pt prn before this admission and pt reports the will continue this. No further OT needs, we will sign off.    Follow Up Recommendations  No OT follow up;Supervision/Assistance - 24 hour    Equipment Recommendations  None recommended by OT       Precautions / Restrictions Precautions Precautions: Fall Restrictions Weight Bearing Restrictions: No      Mobility Bed Mobility Overal bed mobility: Needs Assistance;Modified Independent Bed Mobility: Sit to Supine       Sit to supine: Modified independent (Device/Increase time);HOB elevated      Transfers Overall transfer level: Needs assistance Equipment used: Rolling walker (2 wheeled) Transfers: Sit to/from Stand Sit to Stand: Supervision              Balance Overall balance assessment: Needs assistance Sitting-balance support: No upper extremity supported;Feet supported Sitting balance-Leahy Scale: Good     Standing balance support: No upper extremity supported;During functional activity Standing balance-Leahy Scale: Fair Standing balance comment: standing at sink to groom                           ADL either performed or assessed with clinical judgement   ADL Overall ADL's : Needs assistance/impaired Eating/Feeding: Independent;Sitting   Grooming: Supervision/safety;Set up;Standing;Oral care   Upper Body Bathing: Set up;Sitting   Lower Body Bathing: Minimal assistance Lower Body Bathing Details (indicate cue type and  reason): S sit<>stand Upper Body Dressing : Set up;Sitting   Lower Body Dressing: Moderate assistance Lower Body Dressing Details (indicate cue type and reason): S sit<>stand Toilet Transfer: Supervision/safety;Ambulation;RW Toilet Transfer Details (indicate cue type and reason): Bed>stand at sink to groom>ambulate in hallway>back to bed Toileting- Clothing Manipulation and Hygiene: Supervision/safety;Sit to/from stand               Vision Patient Visual Report: No change from baseline           Hand Dominance Right   Extremity/Trunk Assessment Upper Extremity Assessment Upper Extremity Assessment: Overall WFL for tasks assessed           Communication Communication Communication: HOH   Cognition Arousal/Alertness: Awake/alert Behavior During Therapy: WFL for tasks assessed/performed Overall Cognitive Status: Within Functional Limits for tasks assessed                                                Home Living Family/patient expects to be discharged to:: Private residence Living Arrangements: Spouse/significant other Available Help at Discharge: Family;Available 24 hours/day Type of Home: House Home Access: Stairs to enter [employer] of Steps: 1 Entrance Stairs-Rails: None Home Layout: Multi-level;Laundry or work area in basement;Able to live on main level with bedroom/bathroom     Bathroom Shower/Tub: Corporate investment banker: [place]: Environmental consultant - 2 wheels;Bedside commode;Tub bench;Wheelchair - manual   Additional Comments: Three  daughters (two respiratory therapists, one RN)      Prior Functioning/Environment Level of Independence: Needs assistance  Gait / Transfers Assistance Needed: Pt reports using WC for longer distances outside of home and uses RW or furniture/wall surfs for mobiliy within the home. ADL's / Homemaking Assistance Needed: Pt reports husband assists with ADL and IADL  tasks (washes back and manages water for her, helps with socks, shoes, and starting underwear/pants)            OT Problem List: Impaired balance (sitting and/or standing);Decreased strength         OT Goals(Current goals can be found in the care plan section) Acute Rehab OT Goals Patient Stated Goal: to go home  OT Frequency:                AM-PAC OT "6 Clicks" Daily Activity     Outcome Measure Help from another person eating meals?: None Help from another person taking care of personal grooming?: A Little Help from another person toileting, which includes using toliet, bedpan, or urinal?: A Little Help from another person bathing (including washing, rinsing, drying)?: A Little Help from another person to put on and taking off regular upper body clothing?: A Little Help from another person to put on and taking off regular lower body clothing?: A Lot 6 Click Score: 18   End of Session Equipment Utilized During Treatment: Gait belt;Rolling walker  Activity Tolerance: Patient tolerated treatment well Patient left: in bed;with call bell/phone within reach;with bed alarm set  OT Visit Diagnosis: Unsteadiness on feet (R26.81);Muscle weakness (generalized) (M62.81)                Time: 1010-1044 OT Time Calculation (min): 34 min Charges:  OT General Charges $OT Visit: 1 Visit OT Evaluation $OT Eval Moderate Complexity: 1 Mod OT Treatments $Self Care/Home Management : 8-22 mins  Golden Circle, OTR/L Acute [employer] Pager [phone] Office [phone]     Almon Register 04/22/2019, 10:58 AM

## 2019-04-22 NOTE — Progress Notes (Signed)
PROGRESS NOTE  [name] SNK:[id number] DOB: June 25, 1951 DOA: 04/15/2019 PCP: [name], DO  HPI/Recap of past 24 hours: [name] is an 68 y.o. female with medical history significant ofatrial fibrillation, chronic kidney disease stage III, morbid obesity, GERD with esophageal stricture, hyperlipidemia, hypertension, IBS, osteoporosis who presented with significant weakness which is global. Patient said she has gradually felt weak unable to move around. She was on her bedside commode at home when leaned forward on her bed and slipped to the floor. She could not get up from the floor.   On presentation she was found to have right foot diabetic ulcer which she noted since February 2020.  Positive blood cultures x2/2. -enterococcus.  Reported allergy to penicillin.   Right heel wound, MRI 04/21/19 right foot no sign of osteomyelitis.  TEE planned on Wednesday, 04/24/2019.   04/22/19: Patient was seen and examined at her bedside this morning.  No acute events overnight.  No new complaints.   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 disorder (HCC)   History of gastric bypass   Morbid obesity ([place])   Hypertension   Atrial fibrillation (HCC)   S/P TAVR (transcatheter aortic valve replacement)   SIRS (systemic inflammatory response syndrome) (HCC)   Pressure injury of skin   Infected wound  Generalized weakness/lethargy likely in the setting of active infective process, enterococcus bacteremia and right heel diabetic ulcer Presented with significant weakness, found to have right heel diabetic ulcer Continue to treat underlying condition PT assessed on 04/19/2019 recommendations for home health PT Case manager consulted for arrangement of home health services OT assessed and had no  further recommendations. Fall precautions  Enterococcus bacteremia, unclear etiology. Positive blood cultures collected on 04/15/2019 from both aerobic and anaerobic bottles grew gram-positive cocci 2/2.- Enterococcus  Pansensitive to ampicillin, gentamicin, vancomycin Reported allergy to penicillin Penicillin challenge completed No acute issues with Unasyn  No sign of acute allergic reaction to penicillin Repeated blood culture done on 04/17/2019 negative to date, continue to follow. TEE planned on Wednesday, 04/24/2019 per cardiology Infectious disease following. Continue Unasyn Vital signs reviewed and are stable.  AKI on CKD 4 Baseline creatinine appears to be 2.3 with GFR of 21 Creatinine 2.67 on 04/22/2019 with GFR 18 Start strict I's and O's and daily weight Closely monitor urine output Continue to avoid nephrotoxins Daily BMP If no improvement in the morning will consider holding off torsemide.  Hypokalemia Potassium 3.1 Repleted with KCl 40 mEq once Added 2 g IV magnesium Repeat BMP in the morning  Status post TAVR Plan for TEE to rule out endocarditis Continue IV antibiotics F/u With Dr. Aundra Dubin Cardiology consulted for TEE  Right foot diabetic ulcer, POA in the setting of peripheral vascular disease. Wound care specialist consulted Appreciate recommendations  x-ray no acute osseous abnormalities Elevated CRP 10.2, low sed rate 17 on 04/16/2019 MRI right foot done on 04/21/2019 no evidence of osteomyelitis. Continue IV antibiotics Optimize pain control  Peripheral artery disease Poor dorsalis pedis pulses and diminished on right She states she has scheduled surgery on 05/02/2019 by vascular surgery  Uncontrolled type 2 diabetes with hyperglycemia Hemoglobin A1c greater than 11 Home insulin 70/30 Held off Started on Levemir 60 mg twice daily and NovoLog 5 units 3 times daily Continue sliding scale to resistant Continue to monitor  CBGs  Paroxysmal A. Fib Rate  controlled on Coreg Continue Eliquis for CVA prevention Continue to closely monitor on telemetry  Chronic diastolic CHF Continue strict I's and O's and daily weights  Ambulatory dysfunction/physical debility PT recommended home health PT Case manager consulted to assist with home health services. Continue PT OT Precautions   DVT prophylaxis: eliquis Code Status: Full Code.  Family Communication:  None at bedside. Disposition Plan:  Possible DC to home when cardiology and infectious disease sign off.   Objective: Vitals:   04/21/19 1356 04/21/19 2100 04/22/19 0016 04/22/19 0520  BP: (!) 140/92 (!) 169/65 (!) 135/55 (!) 131/45  Pulse: 77 78  65  Resp:  18  14  Temp: 97.7 F (36.5 C) 98.6 F (37 C)  98.5 F (36.9 C)  TempSrc: Oral Oral  Oral  SpO2: 98% 97%  94%  Weight:      Height:        Intake/Output Summary (Last 24 hours) at 04/22/2019 1300 Last data filed at 04/22/2019 1000 Gross per 24 hour  Intake 400 ml  Output 100 ml  Net 300 ml   Filed Weights   04/15/19 2353 04/16/19 0229  Weight: 97.1 kg 93.5 kg    Exam:  . General: 68 y.o. year-old female is not morbidly obese in no acute distress.  Alert and oriented x4. . Cardiovascular: Regular rate and rhythm no rubs or gallops no JVD or thyromegaly.   Marland Kitchen Respiratory: Clear to auscultation no wheezes or rales.  Good inspiratory effort.   . Abdomen: Obese nontender nondistended normal bowel sounds present. . Musculoskeletal: No lower extremity edema.  No dorsalis pulses palpated on left lower extremity.  Diminished dorsalis pedis pulses on right lower extremity.  . Mood is appropriate for condition and setting.   Data Reviewed: CBC: Recent Labs  Lab 04/15/19 2053 04/16/19 0554 04/17/19 0210 04/19/19 0639  WBC 8.2 9.9 9.0 8.0  NEUTROABS 6.6  --   --   --   HGB 14.5 13.5 12.0 11.8*  HCT 44.7 41.6 36.5 35.8*  MCV 86.0 84.9 85.5 84.0  PLT 189 188 167 831   Basic Metabolic Panel: Recent Labs  Lab 04/16/19  0554 04/17/19 0210 04/19/19 0639 04/20/19 0724 04/22/19 0349  NA 141 139 139 140 139  K 4.1 3.9 3.0* 3.5 3.1*  CL 105 103 99 99 97*  CO2 24 26 27 26 28   GLUCOSE 315* 132* 160* 230* 199*  BUN 37* 40* 61* 82* 93*  CREATININE 2.20* 2.30* 2.39* 2.41* 2.67*  CALCIUM 8.5* 8.4* 8.6* 9.0 8.9   GFR: Estimated Creatinine Clearance: 23.7 mL/min (A) (by C-G formula based on SCr of 2.67 mg/dL (H)). Liver Function Tests: Recent Labs  Lab 04/15/19 2053 04/16/19 0554  AST 19 19  ALT 18 17  ALKPHOS 82 74  BILITOT 0.7 0.9  PROT 6.6 6.1*  ALBUMIN 3.3* 3.0*   Recent Labs  Lab 04/15/19 2053  LIPASE 44   No results for input(s): AMMONIA in the last 168 hours. Coagulation Profile: No results for input(s): INR, PROTIME in the last 168 hours. Cardiac Enzymes: No results for input(s): CKTOTAL, CKMB, CKMBINDEX, TROPONINI in the last 168 hours. BNP (last 3 results) No results for input(s): PROBNP in the last 8760 hours. HbA1C: No results for input(s): HGBA1C in the last 72 hours. CBG: Recent Labs  Lab 04/21/19 1223 04/21/19 1636 04/21/19 2102 04/22/19 0828 04/22/19 1229  GLUCAP 242* 248* 337* 201* 213*   Lipid Profile: No results for  input(s): CHOL, HDL, LDLCALC, TRIG, CHOLHDL, LDLDIRECT in the last 72 hours. Thyroid Function Tests: No results for input(s): TSH, T4TOTAL, FREET4, T3FREE, THYROIDAB in the last 72 hours. Anemia Panel: No results for input(s): VITAMINB12, FOLATE, FERRITIN, TIBC, IRON, RETICCTPCT in the last 72 hours. Urine analysis:    Component Value Date/Time   COLORURINE YELLOW 04/15/2019 2310   APPEARANCEUR CLEAR 04/15/2019 2310   APPEARANCEUR Clear 06/19/2018 1637   LABSPEC 1.012 04/15/2019 2310   PHURINE 7.0 04/15/2019 2310   GLUCOSEU >=500 (A) 04/15/2019 2310   HGBUR NEGATIVE 04/15/2019 2310   BILIRUBINUR NEGATIVE 04/15/2019 2310   BILIRUBINUR Negative 06/19/2018 [place] 04/15/2019 2310   PROTEINUR 30 (A) 04/15/2019 2310    UROBILINOGEN 1.0 11/07/2014 2242   NITRITE NEGATIVE 04/15/2019 2310   LEUKOCYTESUR NEGATIVE 04/15/2019 2310   Sepsis Labs: @LABRCNTIP (procalcitonin:4,lacticidven:4)  ) Recent Results (from the past 240 hour(s))  Blood Culture (routine x 2)     Status: Abnormal   Collection Time: 04/15/19  9:07 PM   Specimen: BLOOD  Result Value Ref Range Status   Specimen Description BLOOD RIGHT ANTECUBITAL  Final   Special Requests   Final    BOTTLES DRAWN AEROBIC AND ANAEROBIC Blood Culture results may not be optimal due to an inadequate volume of blood received in culture bottles   Culture  Setup Time   Final    GRAM POSITIVE COCCI IN BOTH AEROBIC AND ANAEROBIC BOTTLES CRITICAL RESULT CALLED TO, READ BACK BY AND VERIFIED WITH: Rondel Oh PharmD 11:10 04/17/19 ([email or web address]) Performed at [facility], [place] [address], [place], [place] [address]    Culture ENTEROCOCCUS FAECALIS (A)  Final   Report Status 04/21/2019 FINAL  Final   Organism ID, Bacteria ENTEROCOCCUS FAECALIS  Final      Susceptibility   Enterococcus faecalis - MIC*    AMPICILLIN <=2 SENSITIVE Sensitive     VANCOMYCIN 2 SENSITIVE Sensitive     GENTAMICIN SYNERGY SENSITIVE Sensitive     * ENTEROCOCCUS FAECALIS  Blood Culture ID Panel (Reflexed)     Status: Abnormal   Collection Time: 04/15/19  9:07 PM  Result Value Ref Range Status   Enterococcus species DETECTED (A) NOT DETECTED Final    Comment: CRITICAL RESULT CALLED TO, READ BACK BY AND VERIFIED WITH: Rondel Oh PharmD 11:10 04/17/19 ([email or web address])    Vancomycin resistance NOT DETECTED NOT DETECTED Final   Listeria monocytogenes NOT DETECTED NOT DETECTED Final   Staphylococcus species NOT DETECTED NOT DETECTED Final   Staphylococcus aureus (BCID) NOT DETECTED NOT DETECTED Final   Streptococcus species NOT DETECTED NOT DETECTED Final   Streptococcus agalactiae NOT DETECTED NOT DETECTED Final   Streptococcus pneumoniae NOT DETECTED NOT DETECTED Final   Streptococcus pyogenes NOT  DETECTED NOT DETECTED Final   Acinetobacter baumannii NOT DETECTED NOT DETECTED Final   Enterobacteriaceae species NOT DETECTED NOT DETECTED Final   Enterobacter cloacae complex NOT DETECTED NOT DETECTED Final   Escherichia coli NOT DETECTED NOT DETECTED Final   Klebsiella oxytoca NOT DETECTED NOT DETECTED Final   Klebsiella pneumoniae NOT DETECTED NOT DETECTED Final   Proteus species NOT DETECTED NOT DETECTED Final   Serratia marcescens NOT DETECTED NOT DETECTED Final   Haemophilus influenzae NOT DETECTED NOT DETECTED Final   Neisseria meningitidis NOT DETECTED NOT DETECTED Final   Pseudomonas aeruginosa NOT DETECTED NOT DETECTED Final   Candida albicans NOT DETECTED NOT DETECTED Final   Candida glabrata NOT DETECTED NOT DETECTED Final   Candida krusei NOT DETECTED NOT  DETECTED Final   Candida parapsilosis NOT DETECTED NOT DETECTED Final   Candida tropicalis NOT DETECTED NOT DETECTED Final    Comment: Performed at [facility], [place] [address], [place], [place] [address]  Blood Culture (routine x 2)     Status: Abnormal   Collection Time: 04/15/19  9:08 PM   Specimen: BLOOD  Result Value Ref Range Status   Specimen Description BLOOD RIGHT WRIST  Final   Special Requests   Final    BOTTLES DRAWN AEROBIC AND ANAEROBIC Blood Culture results may not be optimal due to an inadequate volume of blood received in culture bottles   Culture  Setup Time   Final    GRAM POSITIVE COCCI IN BOTH AEROBIC AND ANAEROBIC BOTTLES CRITICAL VALUE NOTED.  VALUE IS CONSISTENT WITH PREVIOUSLY REPORTED AND CALLED VALUE.    Culture (A)  Final    ENTEROCOCCUS FAECALIS SUSCEPTIBILITIES PERFORMED ON PREVIOUS CULTURE WITHIN THE LAST 5 DAYS. Performed at [facility], [place] [address] [place], [place] [address]    Report Status 04/19/2019 FINAL  Final  Urine culture     Status: None   Collection Time: 04/15/19 11:11 PM   Specimen: In/Out Cath Urine  Result Value Ref Range Status   Specimen  Description IN/OUT CATH URINE  Final   Special Requests NONE  Final   Culture   Final    NO GROWTH Performed at [facility], [place] [address], [place], [place] [address]    Report Status 04/17/2019 FINAL  Final  SARS Coronavirus 2 [facility] order, Performed in [facility] hospital lab) Nasopharyngeal Nasopharyngeal Swab     Status: None   Collection Time: 04/15/19 11:52 PM   Specimen: Nasopharyngeal Swab  Result Value Ref Range Status   SARS Coronavirus 2 NEGATIVE NEGATIVE Final    Comment: (NOTE) If result is NEGATIVE SARS-CoV-2 target nucleic acids are NOT DETECTED. The SARS-CoV-2 RNA is generally detectable in upper and lower  respiratory specimens during the acute phase of infection. The lowest  concentration of SARS-CoV-2 viral copies this assay can detect is 250  copies / mL. A negative result does not preclude SARS-CoV-2 infection  and should not be used as the sole basis for treatment or other  patient management decisions.  A negative result may occur with  improper specimen collection / handling, submission of specimen other  than nasopharyngeal swab, presence of viral mutation(s) within the  areas targeted by this assay, and inadequate number of viral copies  (<250 copies / mL). A negative result must be combined with clinical  observations, patient history, and epidemiological information. If result is POSITIVE SARS-CoV-2 target nucleic acids are DETECTED. The SARS-CoV-2 RNA is generally detectable in upper and lower  respiratory specimens dur ing the acute phase of infection.  Positive  results are indicative of active infection with SARS-CoV-2.  Clinical  correlation with patient history and other diagnostic information is  necessary to determine patient infection status.  Positive results do  not rule out bacterial infection or co-infection with other viruses. If result is PRESUMPTIVE POSTIVE SARS-CoV-2 nucleic acids MAY BE PRESENT.   A presumptive positive  result was obtained on the submitted specimen  and confirmed on repeat testing.  While 2019 novel coronavirus  (SARS-CoV-2) nucleic acids may be present in the submitted sample  additional confirmatory testing may be necessary for epidemiological  and / or clinical management purposes  to differentiate between  SARS-CoV-2 and other Sarbecovirus currently known to infect  humans.  If clinically indicated additional testing with an alternate test  methodology [phone]) is advised. The SARS-CoV-2 RNA is generally  detectable in upper and lower respiratory sp ecimens during the acute  phase of infection. The expected result is Negative. Fact Sheet for Patients:  StrictlyIdeas.no Fact Sheet for Healthcare Providers: BankingDealers.co.za This test is not yet approved or cleared by the [place] FDA and has been authorized for detection and/or diagnosis of SARS-CoV-2 by FDA under an Emergency Use Authorization (EUA).  This EUA will remain in effect (meaning this test can be used) for the duration of the COVID-19 declaration under Section 564(b)(1) of the Act, 21 U.S.C. section 360bbb-3(b)(1), unless the authorization is terminated or revoked sooner. Performed at [facility], [place] [address] [place], [place] [address]   Culture, blood (routine x 2)     Status: None   Collection Time: 04/17/19  3:15 PM   Specimen: BLOOD  Result Value Ref Range Status   Specimen Description BLOOD RIGHT ANTECUBITAL  Final   Special Requests   Final    BOTTLES DRAWN AEROBIC AND ANAEROBIC Blood Culture results may not be optimal due to an excessive volume of blood received in culture bottles   Culture   Final    NO GROWTH 5 DAYS Performed at [facility], [place] [address] [place], [place] [address]    Report Status 04/22/2019 FINAL  Final  Culture, blood (routine x 2)     Status: None   Collection Time: 04/17/19  3:23 PM   Specimen: BLOOD  Result  Value Ref Range Status   Specimen Description BLOOD BLOOD RIGHT FOREARM  Final   Special Requests   Final    BOTTLES DRAWN AEROBIC AND ANAEROBIC Blood Culture results may not be optimal due to an excessive volume of blood received in culture bottles   Culture   Final    NO GROWTH 5 DAYS Performed at [facility], [place] [address] [place], [place] [address]    Report Status 04/22/2019 FINAL  Final      Studies: No results found.  Scheduled Meds: . Alirocumab  75 mg Subcutaneous Q14 Days  . apixaban  5 mg Oral BID  . calcium-vitamin D  1 tablet Oral 3 times weekly  . carvedilol  25 mg Oral BID  . cholecalciferol  2,000 Units Oral Daily  . fenofibrate  54 mg Oral Daily  . hydrALAZINE  10 mg Oral Q6H  . insulin aspart  0-20 Units Subcutaneous TID WC  . insulin aspart  0-5 Units Subcutaneous QHS  . insulin aspart  8 Units Subcutaneous TID WC  . insulin glargine  60 Units Subcutaneous BID  . mupirocin ointment  1 application Topical BID  . nutrition supplement (JUVEN)  1 packet Oral BID BM  . pravastatin  40 mg Oral q1800  . prenatal vitamin w/FE, FA  1 tablet Oral 3 times weekly  . Ensure Max Protein  11 oz Oral QHS  . torsemide  20 mg Oral BID  . vitamin E  400 Units Oral 3 times weekly    Continuous Infusions: . ampicillin-sulbactam (UNASYN) IV 3 g (04/22/19 0954)     LOS: 6 days     Kayleen Memos, MD [facility] Pager [phone]  If 7PM-7AM, please contact night-coverage [email or web address] Password [email or web address] 04/22/2019, 1:00 PM

## 2019-04-23 DIAGNOSIS — I878 Other specified disorders of veins: Secondary | ICD-10-CM

## 2019-04-23 DIAGNOSIS — R531 Weakness: Secondary | ICD-10-CM

## 2019-04-23 LAB — URINALYSIS, ROUTINE W REFLEX MICROSCOPIC
Bilirubin Urine: NEGATIVE
Glucose, UA: 150 mg/dL — AB
Hgb urine dipstick: NEGATIVE
Ketones, ur: NEGATIVE mg/dL
Leukocytes,Ua: NEGATIVE
Nitrite: NEGATIVE
Protein, ur: NEGATIVE mg/dL
Specific Gravity, Urine: 1.011 (ref 1.005–1.030)
pH: 5 (ref 5.0–8.0)

## 2019-04-23 LAB — BASIC METABOLIC PANEL
Anion gap: 14 (ref 5–15)
BUN: 95 mg/dL — ABNORMAL HIGH (ref 8–23)
CO2: 27 mmol/L (ref 22–32)
Calcium: 8.9 mg/dL (ref 8.9–10.3)
Chloride: 98 mmol/L (ref 98–111)
Creatinine, Ser: 2.71 mg/dL — ABNORMAL HIGH (ref 0.44–1.00)
GFR calc Af Amer: 20 mL/min — ABNORMAL LOW (ref >60)
GFR calc non Af Amer: 17 mL/min — ABNORMAL LOW (ref >60)
Glucose, Bld: 178 mg/dL — ABNORMAL HIGH (ref 70–99)
Potassium: 3.5 mmol/L (ref 3.5–5.1)
Sodium: 139 mmol/L (ref 135–145)

## 2019-04-23 LAB — GLUCOSE, CAPILLARY
Glucose-Capillary: 196 mg/dL — ABNORMAL HIGH (ref 70–99)
Glucose-Capillary: 271 mg/dL — ABNORMAL HIGH (ref 70–99)
Glucose-Capillary: 281 mg/dL — ABNORMAL HIGH (ref 70–99)
Glucose-Capillary: 292 mg/dL — ABNORMAL HIGH (ref 70–99)
Glucose-Capillary: 311 mg/dL — ABNORMAL HIGH (ref 70–99)

## 2019-04-23 LAB — CBC
HCT: 36.6 % (ref 36.0–46.0)
Hemoglobin: 12 g/dL (ref 12.0–15.0)
MCH: 27.1 pg (ref 26.0–34.0)
MCHC: 32.8 g/dL (ref 30.0–36.0)
MCV: 82.6 fL (ref 80.0–100.0)
Platelets: 276 10*3/uL (ref 150–400)
RBC: 4.43 MIL/uL (ref 3.87–5.11)
RDW: 13.2 % (ref 11.5–15.5)
WBC: 7.5 10*3/uL (ref 4.0–10.5)
nRBC: 0 % (ref 0.0–0.2)

## 2019-04-23 LAB — PHOSPHORUS: Phosphorus: 4.2 mg/dL (ref 2.5–4.6)

## 2019-04-23 LAB — PROTEIN / CREATININE RATIO, URINE
Creatinine, Urine: 42.69 mg/dL
Total Protein, Urine: <6 mg/dL

## 2019-04-23 LAB — MAGNESIUM: Magnesium: 2 mg/dL (ref 1.7–2.4)

## 2019-04-23 MED ORDER — INSULIN GLARGINE 100 UNIT/ML ~~LOC~~ SOLN
65.0000 [IU] | Freq: Two times a day (BID) | SUBCUTANEOUS | Status: DC
  Administered 2019-04-23 – 2019-04-26 (×5): 65 [IU] via SUBCUTANEOUS
  Filled 2019-04-23 (×8): qty 0.65

## 2019-04-23 MED ORDER — INSULIN ASPART 100 UNIT/ML ~~LOC~~ SOLN
14.0000 [IU] | Freq: Three times a day (TID) | SUBCUTANEOUS | Status: DC
  Administered 2019-04-23 – 2019-04-25 (×3): 14 [IU] via SUBCUTANEOUS
  Administered 2019-04-25: 7 [IU] via SUBCUTANEOUS
  Administered 2019-04-25 – 2019-04-26 (×3): 14 [IU] via SUBCUTANEOUS

## 2019-04-23 MED ORDER — INSULIN ASPART 100 UNIT/ML ~~LOC~~ SOLN
12.0000 [IU] | Freq: Three times a day (TID) | SUBCUTANEOUS | Status: DC
  Administered 2019-04-23 (×2): 12 [IU] via SUBCUTANEOUS

## 2019-04-23 NOTE — Progress Notes (Signed)
Pharmacy Antibiotic Note  [name] is a 68 y.o. female admitted on 04/15/2019 with weakness. Pt has history of TAVR. She was found to have a suspected foot infection for which she was treated with vanc, ceftriaxone and metronidazole. Later blood cultures positive for Enterococcus. Patient passed penicillin allergy challenge this admission, and pharmacy has been consulted to dose Unasyn for enterococcal bacteremia and wound infection.  Last WBC WNL, afebrile, SCr trending up from admission to 2.71  9/6 MRI showed no evidence of osteomyelitis  Plan: Continue Unasyn 3 gm IV Q 12 hrs Monitor CBC, renal function, clinical improvement, repeat blood cx F/u TEE results for determining LOT and plans for OPAT   Height: 5\' 7"  (170.2 cm) Weight: 207 lb 3.7 oz (94 kg) IBW/kg (Calculated) : 61.6  Temp (24hrs), Avg:97.8 F (36.6 C), Min:97.4 F (36.3 C), Max:98.3 F (36.8 C)  Recent Labs  Lab 04/17/19 0210 04/19/19 0639 04/20/19 0724 04/22/19 0349 04/23/19 0713  WBC 9.0 8.0  --   --  7.5  CREATININE 2.30* 2.39* 2.41* 2.67* 2.71*    Estimated Creatinine Clearance: 23.4 mL/min (A) (by C-G formula based on SCr of 2.71 mg/dL (H)).    Allergies  Allergen Reactions  . Amlodipine Swelling    Ankle swelling  . Atorvastatin Other (See Comments)    Myalgias   . Crestor [Rosuvastatin Calcium] Other (See Comments)    Stiffness and back pain  . Ezetimibe-Simvastatin Other (See Comments)    Leg cramps  . Statins Other (See Comments)    Myalgias  . Celebrex [Celecoxib] Rash  . Levemir [Insulin Detemir] Rash    Antimicrobials this admission: 9/1 cefepime>>9/1 9/1 vanc >>9/2 9/1 Rocephin 9/2 9/1 Flagyl>>9/2 9/2 Unasyn>   Microbiology results: 8/31 Bcx: Enterococcus 2/2, amp and vanc sensitive 8/31 Ucx: NG/final 9/2 Bcx X 2: ngf   Gwenlyn Fudge - Student PharmD  04/23/2019   9:45 AM

## 2019-04-23 NOTE — Consult Note (Signed)
[place] [facility] Admit Date: 04/15/2019 04/23/2019 Rexene Agent Requesting Physician:    Dr. [place] Crane  Reason for Consult: Renal failure HPI:  68 year old female admitted 8/30 Presenting with global weakness, low-grade fevers.  Work-up eventually revealed enterococcal faecalis bacteremia, pansensitive, for which she has been placed on Unasyn.  She has a chronic right heel wound, MRI negative for osteomyelitis.  ID has been assisting.  Past history, extensive, includes history of TAVR, chronic A. 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  Patient appears to have labile stage III/IV CKD with a creatinine ranging between 2.2 and 3.0.  CT abdomen and pelvis at the time of presentation demonstrated small kidneys with evident atrophy.  Urinalysis upon presentation with 1+ proteinuria without hematuria or pyuria (of note has history of microalbuminuria).  In addition to Unasyn, patient has been continued on torsemide until stopped earlier today.  Creatinine is changed from 2.3 at presentation to 2.7 today.  Urine output has been greater than 1 L in the past 24 hours.  Given her bacteremia plan is for a transesophageal echocardiogram tomorrow.  Currently the patient feels well.  Weakness has improved but has not resolved.  No significant edema, dyspnea, chest pain.  She denies rashes.  No lower urinary tract symptoms.  Creat (mg/dL)  Date Value  09/08/2014 0.99  07/03/2013 1.17 (H)  01/24/2013 1.04   Creatinine, Ser (mg/dL)  Date Value  04/23/2019 2.71 (H)  04/22/2019 2.67 (H)  04/20/2019 2.41 (H)  04/19/2019 2.39 (H)  04/17/2019 2.30 (H)  04/16/2019 2.20 (H)  04/15/2019 2.33 (H)  03/11/2019 2.06 (H)  01/08/2019 2.97 (H)   12/19/2018 2.24 (H)  ] I/Os: I/O last 3 completed shifts: In: 33 [P.O.:180; IV Piggyback:200] Out: 1150 [Urine:1150]   ROS NSAIDS: Denies IV Contrast no exposure TMP/SMX no exposure Hypotension not present Balance of 12 systems is negative w/ exceptions as above  PMH  Past Medical History:  Diagnosis Date  . Atrial fibrillation ([place])    a. maintaining sinus s/p DCCV, on Eliquis  . CAD (coronary artery disease)    a. Nonobstructive by cath 2006 - 25% LAD, 25-30% after 1st diag, distal 25% PDA, minor irreg LCx. b. Normal nuc 2011.  . CKD (chronic kidney disease), stage III ([place])   . Complication of anesthesia   . Cystocele   . Difficult intubation 09-07-2012   big neck trouble with intubation parotid surgery"takes little med to sedate"  . Esophageal stricture   . GERD (gastroesophageal reflux disease)   . Hiatal hernia    a. 07/2013: HH with small stricture holding up barium tablet (concides with patient's sx of food sticking).  . History of kidney stones   . Hyperlipidemia   . Hypertension   . IBS (irritable bowel syndrome)   . Memory difficulty 11/26/2015  . Morbid obesity ([place])    a. hx of gastric bypass (sleeve gastrectomy 2015)  . Osteoporosis   . 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  Marland Kitchen Pericardial effusion    a. Mod by echo 2012 at time of PNA. b. Echo 07/2012: small pericardial effusion vs fat.  . Pressure ulcer 04/16/2019   right foot  . S/P TAVR (transcatheter aortic valve replacement)   . Severe aortic stenosis   . Sleep apnea     "  have mask; don't use it" (02/14/2018)  . TIA (transient ischemic attack) 10/2014; 06/2016   "some memory issues since; daughter says my speech is sometimes different" (02/14/2018)  . Type II diabetes mellitus ([facility])    PSH  Past Surgical History:  Procedure Laterality Date  . BREATH TEK H PYLORI N/A 07/29/2013   Procedure: [place];  Surgeon: Shann Medal, MD;  Location: [facility];  Service:  General;  Laterality: N/A;  . CARDIAC CATHETERIZATION  2006  . CARDIOVERSION N/A 02/16/2018   Procedure: CARDIOVERSION;  Surgeon: Pixie Casino, MD;  Location: [facility];  Service: Cardiovascular;  Laterality: N/A;  . CARPAL TUNNEL RELEASE Left 2011  . CATARACT EXTRACTION W/ INTRAOCULAR LENS  IMPLANT, BILATERAL Bilateral 2003   left  . CHOLECYSTECTOMY OPEN  1982  . COLONOSCOPY N/A 07/14/2015   Procedure: COLONOSCOPY;  Surgeon: Ladene Artist, MD;  Location: [facility];  Service: Endoscopy;  Laterality: N/A;  . ESOPHAGOGASTRODUODENOSCOPY N/A 12/27/2013   Procedure: ESOPHAGOGASTRODUODENOSCOPY (EGD);  Surgeon: Shann Medal, MD;  Location: [facility];  Service: General;  Laterality: N/A;  . ESOPHAGOGASTRODUODENOSCOPY (EGD) WITH ESOPHAGEAL DILATION    . INTRAOPERATIVE TRANSTHORACIC ECHOCARDIOGRAM  05/08/2018   Procedure: INTRAOPERATIVE TRANSTHORACIC ECHOCARDIOGRAM;  Surgeon: Burnell Blanks, MD;  Location: [facility];  Service: Open Heart Surgery;;  . LAPAROSCOPIC GASTRIC SLEEVE RESECTION N/A 01/20/2014   Procedure: LAPAROSCOPIC GASTRIC SLEEVE RESECTION;  Surgeon: Shann Medal, MD;  Location: [facility];  Service: General;  Laterality: N/A;  . PAROTIDECTOMY  09/07/2012   Procedure: PAROTIDECTOMY;  Surgeon: Melida Quitter, MD;  Location: [facility];  Service: ENT;  Laterality: Left;  LEFT PAROTIDECTOMY  . RIGHT HEART CATH N/A 09/25/2018   Procedure: RIGHT HEART CATH;  Surgeon: Larey Dresser, MD;  Location: [facility];  Service: Cardiovascular;  Laterality: N/A;  . RIGHT/LEFT HEART CATH AND CORONARY ANGIOGRAPHY N/A 03/29/2018   Procedure: RIGHT/LEFT HEART CATH AND CORONARY ANGIOGRAPHY;  Surgeon: Burnell Blanks, MD;  Location: [facility];  Service: Cardiovascular;  Laterality: N/A;  . TEE WITHOUT CARDIOVERSION N/A 02/16/2018   Procedure: TRANSESOPHAGEAL ECHOCARDIOGRAM (TEE);  Surgeon: Pixie Casino, MD;  Location[facility];  Service: Cardiovascular;  Laterality: N/A;   . TOE AMPUTATION Left 07/2017   great toe; [facility]   . TONSILLECTOMY  1968  . TRANSCATHETER AORTIC VALVE REPLACEMENT, TRANSFEMORAL  05/08/2018   Transcatheter Aortic Valve Replacement   . TRANSCATHETER AORTIC VALVE REPLACEMENT, TRANSFEMORAL N/A 05/08/2018   Procedure: TRANSCATHETER AORTIC VALVE REPLACEMENT, TRANSFEMORAL;  Surgeon: Burnell Blanks, MD;  Location: [facility];  Service: Open Heart Surgery;  Laterality: N/A;  . TUBAL LIGATION  yrs ago  . UPPER GI ENDOSCOPY  01/20/2014   Procedure: UPPER GI ENDOSCOPY;  Surgeon: Shann Medal, MD;  Location: [facility];  Service: General;;   FH  Family History  Problem Relation Age of Onset  . Heart failure Mother   . Hypertension Mother   . Skin cancer Mother   . Colitis Mother   . Diabetes Mother   . Kidney Stones Mother   . Stroke Mother   . Heart disease Father   . Colon polyps Father   . Diabetes Father   . Heart failure Father   . Heart attack Sister   . Diabetes Other   . Stroke Maternal Grandfather   . Diabetes Maternal Grandfather   . Colon cancer Neg Hx    SH  reports that she has never smoked. She has never used  smokeless tobacco. She reports current alcohol use. She reports that she does not use drugs. Allergies  Allergies  Allergen Reactions  . Amlodipine Swelling    Ankle swelling  . Atorvastatin Other (See Comments)    Myalgias   . Crestor [Rosuvastatin Calcium] Other (See Comments)    Stiffness and back pain  . Ezetimibe-Simvastatin Other (See Comments)    Leg cramps  . Statins Other (See Comments)    Myalgias  . Celebrex [Celecoxib] Rash  . Levemir [Insulin Detemir] Rash   Home medications Prior to Admission medications   Medication Sig Start Date End Date Taking? Authorizing Provider  Alirocumab (PRALUENT) 75 MG/ML SOAJ Inject 75 mg into the skin every 14 (fourteen) days. 04/03/19  Yes Larey Dresser, MD  Calcium Carbonate-Vitamin D (CALCIUM 600+D) 600-400 MG-UNIT per tablet Take 1 tablet by mouth 2  (two) times daily. Patient taking differently: Take 1 tablet by mouth 3 (three) times a week.  05/06/15  Yes Cherre Robins, PharmD  carvedilol (COREG) 25 MG tablet TAKE 1 TABLET BY MOUTH TWICE A DAY Patient taking differently: Take 25 mg by mouth 2 (two) times daily with a meal.  04/02/19  Yes Larey Dresser, MD  Cholecalciferol (VITAMIN D3) 50 MCG (2000 UT) TABS Take 2,000 Units by mouth daily.   Yes [provider]  ELIQUIS 5 MG TABS tablet TAKE 1 TABLET BY MOUTH TWICE A DAY Patient taking differently: Take 5 mg by mouth 2 (two) times daily.  02/27/19  Yes Minus Breeding, MD  fenofibrate (TRICOR) 48 MG tablet Take 1 tablet (48 mg total) by mouth daily. 03/14/19  Yes Larey Dresser, MD  hydrALAZINE (APRESOLINE) 10 MG tablet Take 1 tablet (10 mg total) by mouth every morning AND 2 tablets (20 mg total) daily with lunch AND 2 tablets (20 mg total) at bedtime. 03/14/19  Yes Larey Dresser, MD  insulin aspart protamine- aspart (NOVOLOG MIX 70/30) (70-30) 100 UNIT/ML injection Inject 60-75 Units into the skin See admin instructions. Inject 75 units in the morning and 60 units at lunch and 60 units at bedtime.   Yes [provider]  LIVALO 4 MG TABS TAKE 1 TABLET BY MOUTH EVERY DAY Patient taking differently: Take 4 mg by mouth daily.  03/22/19  Yes Gottschalk, Leatrice Jewels M, DO  meclizine (ANTIVERT) 12.5 MG tablet Take 12.5 mg by mouth 3 (three) times daily as needed for dizziness.   Yes [provider]  ondansetron (ZOFRAN ODT) 4 MG disintegrating tablet Take 1 tablet (4 mg total) by mouth every 8 (eight) hours as needed for nausea. 01/03/17  Yes Larene Pickett, PA-C  Prenatal Vit-Fe Fumarate-FA (PRENATAL MULTIVITAMIN) TABS tablet Take 1 tablet by mouth 3 (three) times a week.   Yes [provider]  Propylene Glycol (SYSTANE COMPLETE) 0.6 % SOLN Apply 1 drop to eye daily as needed (dry eyes).    Yes [provider]  torsemide (DEMADEX) 20 MG tablet Take 1 tablet  (20 mg total) by mouth 2 (two) times daily. 03/14/19  Yes Larey Dresser, MD  vitamin E 400 UNIT capsule Take 400 Units by mouth 3 (three) times a week.    Yes [provider]  ACCU-CHEK AVIVA PLUS test strip USE TO CHECK BLOOD SUGAR UP TO TWICE DAILY OR AS INSTRUCTED 09/04/17   Chipper Herb, MD  B-D INS SYR ULTRAFINE 1CC/31G 31G X 5/16" 1 ML MISC USE TO INJECT INSULIN TWICE A DAY AS INSTRUCTED Patient taking differently: 1 Stick  by Other route 3 (three) times daily before meals.  11/16/15   Chipper Herb, MD  Insulin Pen Needle 32G X 4 MM MISC Use to inject insulin with insulin pen 05/17/16   Chipper Herb, MD  Olmesartan-Amlodipine-HCTZ [facility]) 40-5-12.5 MG TABS Take 1 tablet by mouth daily.    04/12/18  [provider]    Current Medications Scheduled Meds: . apixaban  5 mg Oral BID  . calcium-vitamin D  1 tablet Oral 3 times weekly  . carvedilol  25 mg Oral BID  . cholecalciferol  2,000 Units Oral Daily  . fenofibrate  54 mg Oral Daily  . hydrALAZINE  10 mg Oral Q6H  . insulin aspart  0-20 Units Subcutaneous TID WC  . insulin aspart  0-5 Units Subcutaneous QHS  . insulin aspart  14 Units Subcutaneous TID WC  . insulin glargine  65 Units Subcutaneous BID  . mupirocin ointment  1 application Topical BID  . nutrition supplement (JUVEN)  1 packet Oral BID BM  . pravastatin  40 mg Oral q1800  . prenatal vitamin w/FE, FA  1 tablet Oral 3 times weekly  . Ensure Max Protein  11 oz Oral QHS  . vitamin E  400 Units Oral 3 times weekly   Continuous Infusions: . ampicillin-sulbactam (UNASYN) IV 3 g (04/23/19 1015)   PRN Meds:.acetaminophen, meclizine, ondansetron **OR** ondansetron (ZOFRAN) IV, polyvinyl alcohol, sodium chloride  CBC Recent Labs  Lab 04/17/19 0210 04/19/19 0639 04/23/19 0713  WBC 9.0 8.0 7.5  HGB 12.0 11.8* 12.0  HCT 36.5 35.8* 36.6  MCV 85.5 84.0 82.6  PLT 167 202 269   Basic Metabolic Panel Recent Labs  Lab 04/17/19 0210  04/19/19 0639 04/20/19 0724 04/22/19 0349 04/23/19 0713  NA 139 139 140 139 139  K 3.9 3.0* 3.5 3.1* 3.5  CL 103 99 99 97* 98  CO2 26 27 26 28 27   GLUCOSE 132* 160* 230* 199* 178*  BUN 40* 61* 82* 93* 95*  CREATININE 2.30* 2.39* 2.41* 2.67* 2.71*  CALCIUM 8.4* 8.6* 9.0 8.9 8.9  PHOS  --   --   --   --  4.2    Physical Exam  Blood pressure 123/86, pulse 66, temperature (!) 97.4 F (36.3 C), temperature source Oral, resp. rate (!) 22, height 5\' 7"  (1.702 m), weight 94 kg, SpO2 97 %. GEN: Chronically ill-appearing, obese, lying flat in the bed, NAD ENT: NCAT EYES: EOMI CV: Irregular, normal S1-S2 PULM: Coarse bilaterally, diminished in bases ABD: Soft, nontender, nondistended SKIN: No rashes or lesions EXT: No edema   Assessment 19F with CKD 3/4 and labile creatinine, unclear if has AKI or just fluctuation at the current time.  Admitted for enterococcal bacteremia in setting of TAVR, chronic right nonhealing wound with recent angioplasty.  1. CKD 3/4 with possible AKI, nonoliguric; likely that any acute component is driven by #2.  Imaging negative for obstruction upon presentation.  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  Suspect baseline kidney disease driven by diabetic nephropathy and her other medical issues 2. Enterococcal faecalis bacteremia on Unasyn, RCID following, TEE tomorrow 3. Status post TAVR, diastolic congestive heart failure 4. A. fib, on NOAC 5. Morbid obesity status post gastric sleeve 6. DM2 7. Hypertension, blood pressure stable, ARB has been held 8. Hyperlipidemia on Praluent  Plan 1. Agree with holding torsemide, RAAS inhibitors, check C3 and C4, repeat UA 2. Check PTH and UPC 3. Would not place PICC line if long-term antibiotics are needed,  instead would use IJ power line with interventional radiology 4. Daily weights, Daily Renal Panel, Strict I/Os, Avoid nephrotoxins (NSAIDs, judicious IV Contrast)   Pearson Grippe MD [phone] pgr 04/23/2019, 3:52  PM

## 2019-04-23 NOTE — Progress Notes (Signed)
Inpatient Diabetes Program Recommendations  AACE/ADA: New Consensus Statement on Inpatient Glycemic Control (2015)  Target Ranges:  Prepandial:   less than 140 mg/dL      Peak postprandial:   less than 180 mg/dL (1-2 hours)      Critically ill patients:  140 - 180 mg/dL   Lab Results  Component Value Date   GLUCAP 196 (H) 04/23/2019   HGBA1C 11.0 (H) 04/16/2019    Review of Glycemic Control Results for [name], [name] (MRN [id number]) as of 04/23/2019 12:01  Ref. Range 04/22/2019 12:29 04/22/2019 17:17 04/22/2019 21:30 04/23/2019 07:46  Glucose-Capillary Latest Ref Range: 70 - 99 mg/dL 213 (H) 239 (H) 352 (H) 196 (H)   Diabetes history:Type 2 DM Outpatient Diabetes medications:Novolog 70/30 75 (B)/60 (D) /60 (QHS) Current orders for Inpatient glycemic control:Lantus 60 units BID, Novolog 0-20 units TID, Novolog 0-5 units QHS, Novolog 12 units TID  Inpatient Diabetes Program Recommendations:  Consider increasing Lantus 70 units BID and increasing Novolog 14 units TID (assuming patient is consuming >50% of meal).   Thanks, Bronson Curb, MSN, RNC-OB Diabetes Coordinator [phone] (8a-5p)

## 2019-04-23 NOTE — TOC Progression Note (Signed)
Transition of Care [facility]) - Progression Note    Patient Details  Name: [name] MRN: [id number] Date of Birth: 1951/08/06  Transition of Care [facility]) CM/SW [place], [place] Phone Number: 04/23/2019, 12:54 PM  Clinical Narrative:    Pt has all equipment and [place] arranged. Continue to follow as needed.   Expected Discharge Plan: [place] Barriers to Discharge: Continued Medical Work up  Expected Discharge Plan and Services Expected Discharge Plan: [place] In-house Referral: Clinical Social Work Discharge Planning Services: CM Consult Post Acute Care Choice: [place] arrangements for the past 2 months: Single Family Home              DME Arranged: N/A HH Arranged: PT   Social Determinants of Health (SDOH) Interventions    Readmission Risk Interventions Readmission Risk Prevention Plan 05/10/2018  Transportation Screening Complete  PCP or Specialist Appt within 5-7 Days Complete  Home Care Screening Complete  Medication Review (RN CM) Complete  Some recent data might be hidden

## 2019-04-23 NOTE — H&P (View-Only) (Signed)
PROGRESS NOTE  [name] SPQ:[id number] DOB: February 26, 1951 DOA: 04/15/2019 PCP: [name], DO  HPI/Recap of past 24 hours: [name] is an 68 y.o. female with medical history significant ofatrial fibrillation, chronic kidney disease stage III, morbid obesity, GERD with esophageal stricture, hyperlipidemia, hypertension, IBS, osteoporosis who presented with significant weakness which is global. Patient said she has gradually felt weak unable to move around. She was on her bedside commode at home when leaned forward on her bed and slipped to the floor. She could not get up from the floor.   On presentation she was found to have right foot diabetic ulcer which she noted since February 2020.  Positive blood cultures x2/2. -enterococcus.  Reported allergy to penicillin.   Right heel wound, MRI 04/21/19 right foot no sign of osteomyelitis.  TEE planned on Wednesday, 04/24/2019.   04/23/19: Patient seen and examined at bedside.  States she feels blue.  TEE planned tomorrow.   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 disorder (HCC)   History of gastric bypass   Morbid obesity ([place])   Hypertension   Atrial fibrillation (HCC)   S/P TAVR (transcatheter aortic valve replacement)   SIRS (systemic inflammatory response syndrome) (HCC)   Pressure injury of skin   Infected wound  Improving generalized weakness/lethargy likely in the setting of active infective process, enterococcus bacteremia and right heel diabetic ulcer Presented with significant weakness, found to have bacteremia and right heel diabetic ulcer Improving, PT OT with no further recommendation for therapy Continue to mobilize with assistance Fall precautions Vital signs reviewed and are stable.  Enterococcus bacteremia,  unclear etiology. Positive blood cultures collected on 04/15/2019 from both aerobic and anaerobic bottles grew gram-positive cocci 2/2.- Enterococcus  Pansensitive to ampicillin, gentamicin, vancomycin Reported allergy to penicillin Penicillin challenge completed No acute issues with Unasyn  No sign of acute allergic reaction to penicillin Repeated blood culture done on 04/17/2019 negative to date, continue to follow. TEE planned on Wednesday, 04/24/2019 per cardiology Infectious disease following. Continue Unasyn Blood cultures x2 drawn on 04/16/2024 negative final. Urine culture drawn on 04/15/2019 no growth, final.  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  Uncontrolled insulin-dependent type 2 diabetes with hyperglycemia Hemoglobin A1c 11.0 Diabetes coordinator following Increased NovoLog 14U  3 times daily with meals Increase Lantus 65 mg twice daily Continue resistant insulin sliding scale  Resolved hypokalemia post repletion   Status post TAVR Plan for TEE to rule out endocarditis Continue IV antibiotics F/u With Dr. Aundra Dubin Cardiology consulted for TEE TEE planned on 04/24/2019. N.p.o. after midnight  Right foot diabetic ulcer, POA in the setting of peripheral vascular disease. Wound care specialist consulted Appreciate recommendations  x-ray no acute osseous abnormalities Elevated CRP 10.2, low sed rate 17 on 04/16/2019 MRI right foot done on 04/21/2019 no evidence of osteomyelitis. Continue IV antibiotics Optimize pain control  Peripheral artery disease Poor dorsalis pedis pulses and diminished on right She states she has scheduled surgery on 05/02/2019 by vascular surgery  Paroxysmal A. Fib Rate  controlled on Coreg Continue  Eliquis for CVA prevention Continue to closely monitor on telemetry  Chronic diastolic CHF Continue strict I's and O's and daily weights   DVT prophylaxis: eliquis Code Status: Full Code.  Family Communication:  None at bedside.  Consult: Infectious disease, cardiology, nephrology.   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  Plan to discharge to home when infectious disease and cardiology sign off.  Objective: Vitals:   04/22/19 2036 04/23/19 0329 04/23/19 0500 04/23/19 1244  BP: (!) 154/53 (!) 136/53  123/86  Pulse: 66 66    Resp:  (!) 22    Temp: 97.8 F (36.6 C) (!) 97.4 F (36.3 C)    TempSrc: Oral Oral    SpO2: 100% 97%    Weight:   94 kg   Height:        Intake/Output Summary (Last 24 hours) at 04/23/2019 1442 Last data filed at 04/23/2019 1300 Gross per 24 hour  Intake 1240 ml  Output 1350 ml  Net -110 ml   Filed Weights   04/15/19 2353 04/16/19 0229 04/23/19 0500  Weight: 97.1 kg 93.5 kg 94 kg    Exam:  . General: 68 y.o. year-old female obese no acute distress.  Alert and oriented x3.  .  Cardiovascular: Regular rate and rhythm no rubs or gallops no JVD or thyromegaly noted.   Marland Kitchen Respiratory: Clear to auscultation.  No wheezes or rales.  Good inspiratory effort.   . Abdomen: Nontender nondistended with bowel sounds present. . Musculoskeletal: No lower extremity edema.  Right heel wound with 1 out of 2 dorsalis pedis pulse. . Mood is appropriate for condition and setting.   Data Reviewed: CBC: Recent Labs  Lab 04/17/19 0210 04/19/19 0639 04/23/19 0713  WBC 9.0 8.0 7.5  HGB 12.0 11.8* 12.0  HCT 36.5 35.8* 36.6  MCV 85.5 84.0 82.6  PLT 167 202 595    Basic Metabolic Panel: Recent Labs  Lab 04/17/19 0210 04/19/19 0639 04/20/19 0724 04/22/19 0349 04/23/19 0713  NA 139 139 140 139 139  K 3.9 3.0* 3.5 3.1* 3.5  CL 103 99 99 97* 98  CO2 26 27 26 28 27   GLUCOSE 132* 160* 230* 199* 178*  BUN 40* 61* 82* 93* 95*  CREATININE 2.30* 2.39* 2.41* 2.67* 2.71*  CALCIUM 8.4* 8.6* 9.0 8.9 8.9  MG  --   --   --   --  2.0  PHOS  --   --   --   --  4.2   GFR: Estimated Creatinine Clearance: 23.4 mL/min (A) (by C-G formula based on SCr of 2.71 mg/dL (H)). Liver Function Tests: No results for input(s): AST, ALT, ALKPHOS, BILITOT, PROT, ALBUMIN in the last 168 hours. No results for input(s): LIPASE, AMYLASE in the last 168 hours. No results for input(s): AMMONIA in the last 168 hours. Coagulation Profile: No results for input(s): INR, PROTIME in the last 168 hours. Cardiac Enzymes: No results for input(s): CKTOTAL, CKMB, CKMBINDEX, TROPONINI in the last 168 hours. BNP (last 3 results) No results for input(s): PROBNP in the last 8760 hours. HbA1C: No results for input(s): HGBA1C in the last 72 hours. CBG: Recent Labs  Lab 04/22/19 1229 04/22/19 1717 04/22/19 2130 04/23/19 0746 04/23/19 1217  GLUCAP 213* 239* 352* 196* 271*   Lipid Profile:  No results for input(s): CHOL, HDL, LDLCALC, TRIG, CHOLHDL, LDLDIRECT in the last 72 hours. Thyroid Function Tests: No results for input(s): TSH, T4TOTAL, FREET4, T3FREE, THYROIDAB in the last 72 hours. Anemia Panel: No results for input(s): VITAMINB12, FOLATE, FERRITIN, TIBC, IRON, RETICCTPCT in the last 72 hours. Urine analysis:    Component Value Date/Time   COLORURINE YELLOW 04/15/2019 2310   APPEARANCEUR CLEAR 04/15/2019 2310   APPEARANCEUR Clear 06/19/2018 1637   LABSPEC 1.012 04/15/2019 2310   PHURINE 7.0 04/15/2019 2310   GLUCOSEU >=500 (A) 04/15/2019 2310   HGBUR NEGATIVE 04/15/2019 2310   BILIRUBINUR NEGATIVE 04/15/2019 2310   BILIRUBINUR Negative 06/19/2018 [place] 04/15/2019 2310   PROTEINUR 30 (A) 04/15/2019 2310   UROBILINOGEN 1.0 11/07/2014 2242   NITRITE NEGATIVE 04/15/2019 2310   LEUKOCYTESUR NEGATIVE 04/15/2019 2310   Sepsis Labs: @LABRCNTIP (procalcitonin:4,lacticidven:4)  ) Recent Results (from the past 240 hour(s))  Blood Culture (routine x 2)     Status: Abnormal   Collection Time: 04/15/19  9:07 PM   Specimen: BLOOD  Result Value Ref Range Status   Specimen Description BLOOD RIGHT ANTECUBITAL  Final   Special Requests   Final    BOTTLES DRAWN AEROBIC AND ANAEROBIC Blood Culture results may not be optimal due to an inadequate volume of blood received in culture bottles   Culture  Setup Time   Final    GRAM POSITIVE COCCI IN BOTH AEROBIC AND ANAEROBIC BOTTLES CRITICAL RESULT CALLED TO, READ BACK BY AND VERIFIED WITH: Rondel Oh PharmD 11:10 04/17/19 ([email or web address]) Performed at [facility], [place] [address], [place], [place] [address]    Culture ENTEROCOCCUS FAECALIS (A)  Final   Report Status 04/21/2019 FINAL  Final   Organism ID, Bacteria ENTEROCOCCUS FAECALIS  Final      Susceptibility   Enterococcus faecalis - MIC*    AMPICILLIN <=2 SENSITIVE Sensitive     VANCOMYCIN 2 SENSITIVE Sensitive     GENTAMICIN SYNERGY SENSITIVE Sensitive     * ENTEROCOCCUS FAECALIS  Blood Culture ID Panel (Reflexed)     Status: Abnormal   Collection Time: 04/15/19  9:07 PM  Result Value Ref Range Status   Enterococcus species DETECTED (A) NOT DETECTED Final    Comment: CRITICAL RESULT CALLED TO, READ BACK BY AND VERIFIED WITH: Rondel Oh PharmD 11:10 04/17/19 ([email or web address])    Vancomycin resistance NOT DETECTED NOT DETECTED Final   Listeria monocytogenes NOT DETECTED NOT DETECTED Final   Staphylococcus species NOT DETECTED NOT DETECTED Final   Staphylococcus aureus (BCID) NOT DETECTED NOT DETECTED Final   Streptococcus species NOT DETECTED NOT DETECTED Final   Streptococcus agalactiae NOT DETECTED NOT DETECTED Final   Streptococcus pneumoniae NOT  DETECTED NOT DETECTED Final   Streptococcus pyogenes NOT DETECTED NOT DETECTED Final   Acinetobacter baumannii NOT DETECTED NOT DETECTED Final   Enterobacteriaceae species NOT DETECTED NOT DETECTED Final   Enterobacter cloacae complex NOT DETECTED NOT DETECTED Final   Escherichia coli NOT DETECTED NOT DETECTED Final   Klebsiella oxytoca NOT DETECTED NOT DETECTED Final   Klebsiella pneumoniae NOT DETECTED NOT DETECTED Final   Proteus species NOT DETECTED NOT DETECTED Final   Serratia marcescens NOT DETECTED NOT DETECTED Final   Haemophilus influenzae NOT DETECTED NOT DETECTED Final   Neisseria meningitidis NOT DETECTED NOT DETECTED Final   Pseudomonas aeruginosa NOT DETECTED NOT DETECTED Final   Candida albicans NOT DETECTED NOT DETECTED Final   Candida glabrata NOT DETECTED NOT DETECTED Final   Candida krusei  NOT DETECTED NOT DETECTED Final   Candida parapsilosis NOT DETECTED NOT DETECTED Final   Candida tropicalis NOT DETECTED NOT DETECTED Final    Comment: Performed at [facility], [place] [address], [place], [place] [address]  Blood Culture (routine x 2)     Status: Abnormal   Collection Time: 04/15/19  9:08 PM   Specimen: BLOOD  Result Value Ref Range Status   Specimen Description BLOOD RIGHT WRIST  Final   Special Requests   Final    BOTTLES DRAWN AEROBIC AND ANAEROBIC Blood Culture results may not be optimal due to an inadequate volume of blood received in culture bottles   Culture  Setup Time   Final    GRAM POSITIVE COCCI IN BOTH AEROBIC AND ANAEROBIC BOTTLES CRITICAL VALUE NOTED.  VALUE IS CONSISTENT WITH PREVIOUSLY REPORTED AND CALLED VALUE.    Culture (A)  Final    ENTEROCOCCUS FAECALIS SUSCEPTIBILITIES PERFORMED ON PREVIOUS CULTURE WITHIN THE LAST 5 DAYS. Performed at [facility], [place] [address], [place], [place] [address]    Report Status 04/19/2019 FINAL  Final  Urine culture     Status: None   Collection Time: 04/15/19 11:11 PM   Specimen: In/Out Cath  Urine  Result Value Ref Range Status   Specimen Description IN/OUT CATH URINE  Final   Special Requests NONE  Final   Culture   Final    NO GROWTH Performed at [facility], [place] [address], [place], [place] [address]    Report Status 04/17/2019 FINAL  Final  SARS Coronavirus 2 [facility] order, Performed in [facility] hospital lab) Nasopharyngeal Nasopharyngeal Swab     Status: None   Collection Time: 04/15/19 11:52 PM   Specimen: Nasopharyngeal Swab  Result Value Ref Range Status   SARS Coronavirus 2 NEGATIVE NEGATIVE Final    Comment: (NOTE) If result is NEGATIVE SARS-CoV-2 target nucleic acids are NOT DETECTED. The SARS-CoV-2 RNA is generally detectable in upper and lower  respiratory specimens during the acute phase of infection. The lowest  concentration of SARS-CoV-2 viral copies this assay can detect is 250  copies / mL. A negative result does not preclude SARS-CoV-2 infection  and should not be used as the sole basis for treatment or other  patient management decisions.  A negative result may occur with  improper specimen collection / handling, submission of specimen other  than nasopharyngeal swab, presence of viral mutation(s) within the  areas targeted by this assay, and inadequate number of viral copies  (<250 copies / mL). A negative result must be combined with clinical  observations, patient history, and epidemiological information. If result is POSITIVE SARS-CoV-2 target nucleic acids are DETECTED. The SARS-CoV-2 RNA is generally detectable in upper and lower  respiratory specimens dur ing the acute phase of infection.  Positive  results are indicative of active infection with SARS-CoV-2.  Clinical  correlation with patient history and other diagnostic information is  necessary to determine patient infection status.  Positive results do  not rule out bacterial infection or co-infection with other viruses. If result is PRESUMPTIVE POSTIVE SARS-CoV-2 nucleic  acids MAY BE PRESENT.   A presumptive positive result was obtained on the submitted specimen  and confirmed on repeat testing.  While 2019 novel coronavirus  (SARS-CoV-2) nucleic acids may be present in the submitted sample  additional confirmatory testing may be necessary for epidemiological  and / or clinical management purposes  to differentiate between  SARS-CoV-2 and other Sarbecovirus currently  known to infect humans.  If clinically indicated additional testing with an alternate test  methodology [phone]) is advised. The SARS-CoV-2 RNA is generally  detectable in upper and lower respiratory sp ecimens during the acute  phase of infection. The expected result is Negative. Fact Sheet for Patients:  StrictlyIdeas.no Fact Sheet for Healthcare Providers: BankingDealers.co.za This test is not yet approved or cleared by the [place] FDA and has been authorized for detection and/or diagnosis of SARS-CoV-2 by FDA under an Emergency Use Authorization (EUA).  This EUA will remain in effect (meaning this test can be used) for the duration of the COVID-19 declaration under Section 564(b)(1) of the Act, 21 U.S.C. section 360bbb-3(b)(1), unless the authorization is terminated or revoked sooner. Performed at [facility], [place] [address], [place], [place] [address]   Culture, blood (routine x 2)     Status: None   Collection Time: 04/17/19  3:15 PM   Specimen: BLOOD  Result Value Ref Range Status   Specimen Description BLOOD RIGHT ANTECUBITAL  Final   Special Requests   Final    BOTTLES DRAWN AEROBIC AND ANAEROBIC Blood Culture results may not be optimal due to an excessive volume of blood received in culture bottles   Culture   Final    NO GROWTH 5 DAYS Performed at [facility], [place] [address], [place], [place] [address]    Report Status 04/22/2019 FINAL  Final  Culture, blood (routine x 2)     Status: None   Collection  Time: 04/17/19  3:23 PM   Specimen: BLOOD  Result Value Ref Range Status   Specimen Description BLOOD BLOOD RIGHT FOREARM  Final   Special Requests   Final    BOTTLES DRAWN AEROBIC AND ANAEROBIC Blood Culture results may not be optimal due to an excessive volume of blood received in culture bottles   Culture   Final    NO GROWTH 5 DAYS Performed at [facility], [place] [address], [place], [place] [address]    Report Status 04/22/2019 FINAL  Final      Studies: No results found.  Scheduled Meds: . apixaban  5 mg Oral BID  . calcium-vitamin D  1 tablet Oral 3 times weekly  . carvedilol  25 mg Oral BID  . cholecalciferol  2,000 Units Oral Daily  . fenofibrate  54 mg Oral Daily  . hydrALAZINE  10 mg Oral Q6H  . insulin aspart  0-20 Units Subcutaneous TID WC  . insulin aspart  0-5 Units Subcutaneous QHS  . insulin aspart  14 Units Subcutaneous TID WC  . insulin glargine  65 Units Subcutaneous BID  . mupirocin ointment  1 application Topical BID  . nutrition supplement (JUVEN)  1 packet Oral BID BM  . pravastatin  40 mg Oral q1800  . prenatal vitamin w/FE, FA  1 tablet Oral 3 times weekly  . Ensure Max Protein  11 oz Oral QHS  . torsemide  20 mg Oral BID  . vitamin E  400 Units Oral 3 times weekly    Continuous Infusions: . ampicillin-sulbactam (UNASYN) IV 3 g (04/23/19 1015)     LOS: 7 days     Kayleen Memos, MD [facility] Pager [phone]  If 7PM-7AM, please contact night-coverage [email or web address] Password [email or web address] 04/23/2019, 2:42 PM

## 2019-04-23 NOTE — Progress Notes (Signed)
[facility]   Reason for visit: Follow up on Enterococcal sepsis  Antibiotics: Unasyn, day 6  Interval History: MRI performed over the weekend showed no evidence of osteomyelitis, only myositis. TEE planned for tomorrow Appetite has been "fair" per pt. Fever curve, WBC and CR trends, cx results, imaging and ABX usage all independently reviewed    Current Facility-Administered Medications:  .  acetaminophen (TYLENOL) tablet 650 mg, 650 mg, Oral, Q6H PRN, Irene Pap N, DO, 650 mg at 04/18/19 1214 .  Ampicillin-Sulbactam (UNASYN) 3 g in sodium chloride 0.9 % 100 mL IVPB, 3 g, Intravenous, Q12H, Hall, Carole N, DO, Last Rate: 200 mL/hr at 04/23/19 1015, 3 g at 04/23/19 1015 .  apixaban (ELIQUIS) tablet 5 mg, 5 mg, Oral, BID, Jonelle Sidle, Mohammad L, MD, 5 mg at 04/23/19 1009 .  calcium-vitamin D (OSCAL WITH D) 500-200 MG-UNIT per tablet 1 tablet, 1 tablet, Oral, 3 times weekly, Elwyn Reach, MD, 1 tablet at 04/22/19 0945 .  carvedilol (COREG) tablet 25 mg, 25 mg, Oral, BID, Jonelle Sidle, Mohammad L, MD, 25 mg at 04/23/19 1008 .  cholecalciferol (VITAMIN D3) tablet 2,000 Units, 2,000 Units, Oral, Daily, Elwyn Reach, MD, 2,000 Units at 04/23/19 1007 .  fenofibrate tablet 54 mg, 54 mg, Oral, Daily, Jonelle Sidle, Mohammad L, MD, 54 mg at 04/23/19 1008 .  hydrALAZINE (APRESOLINE) tablet 10 mg, 10 mg, Oral, Q6H, Garba, Mohammad L, MD, 10 mg at 04/23/19 1244 .  insulin aspart (novoLOG) injection 0-20 Units, 0-20 Units, Subcutaneous, TID WC, Kayleen Memos, DO, 11 Units at 04/23/19 1243 .  insulin aspart (novoLOG) injection 0-5 Units, 0-5 Units, Subcutaneous, QHS, Kayleen Memos, DO, 5 Units at 04/22/19 2150 .  insulin aspart (novoLOG) injection 12 Units, 12 Units, Subcutaneous, TID WC, Hall, Carole N, DO, 12 Units at 04/23/19 1243 .  insulin glargine (LANTUS) injection 60 Units, 60 Units, Subcutaneous, BID, Kayleen Memos, DO, 60 Units at 04/23/19 1342 .  meclizine (ANTIVERT)  tablet 12.5 mg, 12.5 mg, Oral, TID PRN, Jonelle Sidle, Mohammad L, MD .  mupirocin ointment (BACTROBAN) 2 % 1 application, 1 application, Topical, BID, Elwyn Reach, MD, 1 application at 53/66/44 1010 .  nutrition supplement (JUVEN) (JUVEN) powder packet 1 packet, 1 packet, Oral, BID BM, Irene Pap N, DO, 1 packet at 04/23/19 1010 .  ondansetron (ZOFRAN) tablet 4 mg, 4 mg, Oral, Q6H PRN **OR** ondansetron (ZOFRAN) injection 4 mg, 4 mg, Intravenous, Q6H PRN, Garba, Mohammad L, MD .  polyvinyl alcohol (LIQUIFILM TEARS) 1.4 % ophthalmic solution 1 drop, 1 drop, Both Eyes, Daily PRN, Jonelle Sidle, Mohammad L, MD .  pravastatin (PRAVACHOL) tablet 40 mg, 40 mg, Oral, q1800, Gala Romney L, MD, 40 mg at 04/22/19 1747 .  prenatal vitamin w/FE, FA (PRENATAL 1 + 1) 27-1 MG tablet 1 tablet, 1 tablet, Oral, 3 times weekly, Elwyn Reach, MD, 1 tablet at 04/22/19 0946 .  protein supplement (ENSURE MAX) liquid, 11 oz, Oral, QHS, Hall, Carole N, DO, 11 oz at 04/21/19 2233 .  sodium chloride (OCEAN) 0.65 % nasal spray 1 spray, 1 spray, Each Nare, PRN, Jonelle Sidle, Mohammad L, MD .  torsemide (DEMADEX) tablet 20 mg, 20 mg, Oral, BID, Jonelle Sidle, Mohammad L, MD, 20 mg at 04/23/19 0819 .  vitamin E capsule 400 Units, 400 Units, Oral, 3 times weekly, Elwyn Reach, MD, 400 Units at 04/22/19 0947   Physical Exam:   Vitals:   04/23/19 0329 04/23/19 1244  BP: (!) 136/53 123/86  Pulse: 66   Resp: (!) 22   Temp: (!) 97.4 F (36.3 C)   SpO2: 97%    Physical Exam Gen: obese, NAD, A&Ox 3 Head: NCAT, no temporal wasting evident EENT: PERRL, EOMI, MMM, adequate dentition Neck: supple, no JVD CV: NRRR, I/VI diastolic murmur at LUSB appreciated Pulm: CTA bilaterally, no wheeze or retractions Abd: soft, obese, well-healed midline incision, NTND, +BS Extrems:  trace LE edema, 2+ pulses MSK: half dollar sized ulcer at RT malleolus with overlying eschar making wound unstageable, + surrounding erythema but no drainage evident  Skin: chronic venous stasis to distal LEs, adequate skin turgor Neuro: CN II-XII grossly intact, no focal neurologic deficits appreciated, gait was not assessed, A&Ox 3  Review of Systems:  Review of Systems  Constitutional: Positive for malaise/fatigue. Negative for chills, fever and weight loss.  HENT: Negative for congestion, hearing loss, sinus pain and sore throat.   Eyes: Negative for blurred vision, photophobia and discharge.  Respiratory: Negative for cough, hemoptysis and shortness of breath.   Cardiovascular: Negative for chest pain, palpitations, orthopnea and leg swelling.  Gastrointestinal: Negative for abdominal pain, constipation, diarrhea, heartburn, nausea and vomiting.  Genitourinary: Negative for dysuria, flank pain, frequency and urgency.       +urinary incontinence x 7 months  Musculoskeletal: Positive for joint pain. Negative for back pain and myalgias.       RT ankle/heel pain  Skin: Negative for itching and rash.  Neurological: Positive for weakness. Negative for tremors, seizures and headaches.  Endo/Heme/Allergies: Negative for polydipsia. Does not bruise/bleed easily.  Psychiatric/Behavioral: Negative for depression and substance abuse. The patient is not nervous/anxious and does not have insomnia.      Lab Results  Component Value Date   WBC 7.5 04/23/2019   HGB 12.0 04/23/2019   HCT 36.6 04/23/2019   MCV 82.6 04/23/2019   PLT 276 04/23/2019    Lab Results  Component Value Date   CREATININE 2.71 (H) 04/23/2019   BUN 95 (H) 04/23/2019   NA 139 04/23/2019   K 3.5 04/23/2019   CL 98 04/23/2019   CO2 27 04/23/2019    Lab Results  Component Value Date   ALT 17 04/16/2019   AST 19 04/16/2019   ALKPHOS 74 04/16/2019     Microbiology: Recent Results (from the past 240 hour(s))  Blood Culture (routine x 2)     Status: Abnormal   Collection Time: 04/15/19  9:07 PM   Specimen: BLOOD  Result Value Ref Range Status   Specimen Description BLOOD RIGHT  ANTECUBITAL  Final   Special Requests   Final    BOTTLES DRAWN AEROBIC AND ANAEROBIC Blood Culture results may not be optimal due to an inadequate volume of blood received in culture bottles   Culture  Setup Time   Final    GRAM POSITIVE COCCI IN BOTH AEROBIC AND ANAEROBIC BOTTLES CRITICAL RESULT CALLED TO, READ BACK BY AND VERIFIED WITH: Rondel Oh PharmD 11:10 04/17/19 ([email or web address]) Performed at [facility], [place] [address], [place], [place] [address]    Culture ENTEROCOCCUS FAECALIS (A)  Final   Report Status 04/21/2019 FINAL  Final   Organism ID, Bacteria ENTEROCOCCUS FAECALIS  Final      Susceptibility   Enterococcus faecalis - MIC*    AMPICILLIN <=2 SENSITIVE Sensitive     VANCOMYCIN 2 SENSITIVE Sensitive     GENTAMICIN SYNERGY SENSITIVE Sensitive     * ENTEROCOCCUS FAECALIS  Blood Culture ID Panel (Reflexed)  Status: Abnormal   Collection Time: 04/15/19  9:07 PM  Result Value Ref Range Status   Enterococcus species DETECTED (A) NOT DETECTED Final    Comment: CRITICAL RESULT CALLED TO, READ BACK BY AND VERIFIED WITH: Rondel Oh PharmD 11:10 04/17/19 ([email or web address])    Vancomycin resistance NOT DETECTED NOT DETECTED Final   Listeria monocytogenes NOT DETECTED NOT DETECTED Final   Staphylococcus species NOT DETECTED NOT DETECTED Final   Staphylococcus aureus (BCID) NOT DETECTED NOT DETECTED Final   Streptococcus species NOT DETECTED NOT DETECTED Final   Streptococcus agalactiae NOT DETECTED NOT DETECTED Final   Streptococcus pneumoniae NOT DETECTED NOT DETECTED Final   Streptococcus pyogenes NOT DETECTED NOT DETECTED Final   Acinetobacter baumannii NOT DETECTED NOT DETECTED Final   Enterobacteriaceae species NOT DETECTED NOT DETECTED Final   Enterobacter cloacae complex NOT DETECTED NOT DETECTED Final   Escherichia coli NOT DETECTED NOT DETECTED Final   Klebsiella oxytoca NOT DETECTED NOT DETECTED Final   Klebsiella pneumoniae NOT DETECTED NOT DETECTED Final   Proteus species NOT  DETECTED NOT DETECTED Final   Serratia marcescens NOT DETECTED NOT DETECTED Final   Haemophilus influenzae NOT DETECTED NOT DETECTED Final   Neisseria meningitidis NOT DETECTED NOT DETECTED Final   Pseudomonas aeruginosa NOT DETECTED NOT DETECTED Final   Candida albicans NOT DETECTED NOT DETECTED Final   Candida glabrata NOT DETECTED NOT DETECTED Final   Candida krusei NOT DETECTED NOT DETECTED Final   Candida parapsilosis NOT DETECTED NOT DETECTED Final   Candida tropicalis NOT DETECTED NOT DETECTED Final    Comment: Performed at [facility], [address] [place], [place] [address]  Blood Culture (routine x 2)     Status: Abnormal   Collection Time: 04/15/19  9:08 PM   Specimen: BLOOD  Result Value Ref Range Status   Specimen Description BLOOD RIGHT WRIST  Final   Special Requests   Final    BOTTLES DRAWN AEROBIC AND ANAEROBIC Blood Culture results may not be optimal due to an inadequate volume of blood received in culture bottles   Culture  Setup Time   Final    GRAM POSITIVE COCCI IN BOTH AEROBIC AND ANAEROBIC BOTTLES CRITICAL VALUE NOTED.  VALUE IS CONSISTENT WITH PREVIOUSLY REPORTED AND CALLED VALUE.    Culture (A)  Final    ENTEROCOCCUS FAECALIS SUSCEPTIBILITIES PERFORMED ON PREVIOUS CULTURE WITHIN THE LAST 5 DAYS. Performed at [facility], [place] [address], [place], [place] [address]    Report Status 04/19/2019 FINAL  Final  Urine culture     Status: None   Collection Time: 04/15/19 11:11 PM   Specimen: In/Out Cath Urine  Result Value Ref Range Status   Specimen Description IN/OUT CATH URINE  Final   Special Requests NONE  Final   Culture   Final    NO GROWTH Performed at [facility], [place] [address] [place], [place] [address]    Report Status 04/17/2019 FINAL  Final  SARS Coronavirus 2 [facility] order, Performed in [facility] hospital lab) Nasopharyngeal Nasopharyngeal Swab     Status: None   Collection Time: 04/15/19 11:52 PM   Specimen:  Nasopharyngeal Swab  Result Value Ref Range Status   SARS Coronavirus 2 NEGATIVE NEGATIVE Final    Comment: (NOTE) If result is NEGATIVE SARS-CoV-2 target nucleic acids are NOT DETECTED. The SARS-CoV-2 RNA is generally detectable in upper and lower  respiratory specimens during the acute phase of infection. The lowest  concentration of SARS-CoV-2 viral copies  this assay can detect is 250  copies / mL. A negative result does not preclude SARS-CoV-2 infection  and should not be used as the sole basis for treatment or other  patient management decisions.  A negative result may occur with  improper specimen collection / handling, submission of specimen other  than nasopharyngeal swab, presence of viral mutation(s) within the  areas targeted by this assay, and inadequate number of viral copies  (<250 copies / mL). A negative result must be combined with clinical  observations, patient history, and epidemiological information. If result is POSITIVE SARS-CoV-2 target nucleic acids are DETECTED. The SARS-CoV-2 RNA is generally detectable in upper and lower  respiratory specimens dur ing the acute phase of infection.  Positive  results are indicative of active infection with SARS-CoV-2.  Clinical  correlation with patient history and other diagnostic information is  necessary to determine patient infection status.  Positive results do  not rule out bacterial infection or co-infection with other viruses. If result is PRESUMPTIVE POSTIVE SARS-CoV-2 nucleic acids MAY BE PRESENT.   A presumptive positive result was obtained on the submitted specimen  and confirmed on repeat testing.  While 2019 novel coronavirus  (SARS-CoV-2) nucleic acids may be present in the submitted sample  additional confirmatory testing may be necessary for epidemiological  and / or clinical management purposes  to differentiate between  SARS-CoV-2 and other Sarbecovirus currently known to infect humans.  If clinically  indicated additional testing with an alternate test  methodology [phone]) is advised. The SARS-CoV-2 RNA is generally  detectable in upper and lower respiratory sp ecimens during the acute  phase of infection. The expected result is Negative. Fact Sheet for Patients:  StrictlyIdeas.no Fact Sheet for Healthcare Providers: BankingDealers.co.za This test is not yet approved or cleared by the [place] FDA and has been authorized for detection and/or diagnosis of SARS-CoV-2 by FDA under an Emergency Use Authorization (EUA).  This EUA will remain in effect (meaning this test can be used) for the duration of the COVID-19 declaration under Section 564(b)(1) of the Act, 21 U.S.C. section 360bbb-3(b)(1), unless the authorization is terminated or revoked sooner. Performed at [facility], [place] [address], [place], [place] [address]   Culture, blood (routine x 2)     Status: None   Collection Time: 04/17/19  3:15 PM   Specimen: BLOOD  Result Value Ref Range Status   Specimen Description BLOOD RIGHT ANTECUBITAL  Final   Special Requests   Final    BOTTLES DRAWN AEROBIC AND ANAEROBIC Blood Culture results may not be optimal due to an excessive volume of blood received in culture bottles   Culture   Final    NO GROWTH 5 DAYS Performed at [facility], [place] [address], [place], [place] [address]    Report Status 04/22/2019 FINAL  Final  Culture, blood (routine x 2)     Status: None   Collection Time: 04/17/19  3:23 PM   Specimen: BLOOD  Result Value Ref Range Status   Specimen Description BLOOD BLOOD RIGHT FOREARM  Final   Special Requests   Final    BOTTLES DRAWN AEROBIC AND ANAEROBIC Blood Culture results may not be optimal due to an excessive volume of blood received in culture bottles   Culture   Final    NO GROWTH 5 DAYS Performed at [facility], [place] [address], [place], [place] [address]    Report Status 04/22/2019  FINAL  Final  Impression/Plan: Patient is a 68 year old morbidly obese white female status post gastric bypass with stage III chronic kidney disease, h/o severe aortic stenosis, s/p TAVR and a chronic right heel ulcer admitted with malaise and subsequently found to have Enterococcal bacteremia.  1. Enterococcal sepsis -both of the patient's initial blood cultures are now positive for Enterococcus faecalis.  She was empirically started on vancomycin, rocephin, and Flagyl.  She had a reported allergy to penicillin which caused nausea and potentially emesis when she received an IM penicillin injection many years ago.  As her enterococcus is ampicillin sensitive, she was given a test dose of oral penicillin which she tolerated and then was transitioned to IV Unasyn instead. Will have pharmacy remove PCN as an allergy from the patient's chart.  Repeat blood cultures are now negative, confirming clearance of her bacteremia.  Given her history of a TAVR, would proceed directly to transesophageal echocardiogram to best evaluate her TAVR apparatus and to rule out endocarditis.  This is tentatively planned for Wednesday/tomorrow. Given the chronic nature of her right heel wound and poorly controlled diabetes, I am concerned that she is at rather high risk for subacute endocarditis.  2. Chronic RT heel ulcer - the patient admits she has had a chronic ulcer to her right heel and ankle since February of this year. It is unclear why she did not seek medical attention for this wound earlier although today's comments indicate she was asking her daughter who is a wound care nurse serve as the only faciltator of this care. Based on conversations with the patient it appears to be multifactorial as the COVID pandemic made access to medical care more challenging, she appears to have an element of peripheral neuropathy perhaps from DM and her gastric bypass history and subsequent vitamin deficiencies, and overall  neglect/denial of her medical comorbidities and infectious risks.Baseline CRP here is 10.2. Continue unasyn for now with plan to transition to IV ampicillin at discharge as noted above. Fortunately, an MRI of her RT ankle showed no evidence of osteomyelitis but as she admits to frequent urinary incontinence over the past 6-7 months, urinary contamination of this wound would serve as the most likely route by which she became bacteremic. I have encouraged the patient to seek and remain in care at a local wound care center post-hospital d/c.   3. CRI - The patient's Cr baseline is unclear as she has ranged from 1.78 to 3.21 within the past 9 months, mostly with a trend of worsening in recent months. Her Cr today is 2.71, more consistent with stage IV renal disease. Difficult to say if this is true AoCRF, particularly as her DM has been mostly poorly controlled with ambulatory blood sugars in the mid 200s-low 300s in recent months per the patient.  I anticipate the patient will need some sort of long-term IV access for parenteral antibiotics, but the duration of this treatment is unknown at this time. Appreciate nephrology assistance as she remains in a high risk category to progress to ESRD in the next several years.

## 2019-04-23 NOTE — Progress Notes (Signed)
Nutrition Follow-up  DOCUMENTATION CODES:   Obesity unspecified  INTERVENTION:   -Continue MVI with minerals daily -Continue Ensure Max po daily, each supplement provides 150 kcal and 30 grams of protein -Continue 1 packet Juven BID, each packet provides 95 calories, 2.5 grams of protein (collagen), and 9.8 grams of carbohydrate (3 grams sugar); also contains 7 grams of L-arginine and L-glutamine, 300 mg vitamin C, 15 mg vitamin E, 1.2 mcg vitamin B-12, 9.5 mg zinc, 200 mg calcium, and 1.5 g  Calcium Beta-hydroxy-Beta-methylbutyrate to support wound healing  NUTRITION DIAGNOSIS:   Increased nutrient needs related to wound healing as evidenced by estimated needs.  Ongoing  GOAL:   Patient will meet greater than or equal to 90% of their needs  Progressing   MONITOR:   PO intake, Supplement acceptance, Labs, Weight trends, Skin, I & O's  REASON FOR ASSESSMENT:   Consult Wound healing  ASSESSMENT:   Janesa P Husk is a 68 y.o. female with medical history significant of atrial fibrillation, chronic kidney disease stage III, morbid obesity, GERD with esophageal stricture, hyperlipidemia, hypertension, IBS, osteoporosis who presented with significant weakness which is global.  Patient said she has gradually felt weak unable to move around.  She was on her bedside commode at home when she slipped off and fell.  She could not get up from the floor.  She called her daughter who now called EMS.  Patient was seen by EMS patient was slightly altered.  She denied any fever or chills denied any exposure to cold or any sick contact.  When she arrived the ER temperature was 100.4.  Patient denied any abdominal pain or diarrhea no other source of her low-grade temperature.  She was globally noted to be weak however.  She has chronic right heel wound which is chronic and then stable.  No obvious drainage and no obvious infection.  Patient is being admitted for observation and management of her  weakness.  Reviewed I/O's: -770 ml x 24 hours and +2.5 L since admission  UOP: 1.2 L x 24 hours  Per chart review, plan for TEE tomorrow due to bacteremia.   Pt receiving nursing care at time of visit. Pt continues to have good appetite. Meal completion 70-100%. Pt is compliant with Juven and Ensure Max supplements.   Wt has been stable since admission.   Labs reviewed: CBGS: [id number] (inpatient orders for glycemic control are 0-20 units insulin aspart TID with meals, 0-5 units insulin aspart q HS, and 14 units insulin aspart TID with meals, and 65 units insulin glargine BID).   Diet Order:   Diet Order            Diet heart healthy/carb modified Room service appropriate? Yes; Fluid consistency: Thin  Diet effective now              EDUCATION NEEDS:   No education needs have been identified at this time  Skin:  Skin Assessment: Skin Integrity Issues: Skin Integrity Issues:: Stage III, Other (Comment) Stage III: rt heel Other: lt foot toes 2-3 have scabs  Last BM:  04/17/19  Height:   Ht Readings from Last 1 Encounters:  04/16/19 5\' 7"  (1.702 m)    Weight:   Wt Readings from Last 1 Encounters:  04/23/19 94 kg    Ideal Body Weight:  61.4 kg  BMI:  Body mass index is 32.46 kg/m.  Estimated Nutritional Needs:   Kcal:  1850-2050  Protein:  110-125 grams  Fluid:  > 1.8  L    Fusaye Wachtel A. Jimmye Norman, RD, LDN, [place] Registered Dietitian II Certified Diabetes Care and Education Specialist Pager: [phone] After hours Pager: [phone]

## 2019-04-23 NOTE — Progress Notes (Signed)
PROGRESS NOTE  [name] YBO:[id number] DOB: 1950/09/02 DOA: 04/15/2019 PCP: Alison Norlander, DO  HPI/Recap of past 24 hours: [name] is an 68 y.o. female with medical history significant ofatrial fibrillation, chronic kidney disease stage III, morbid obesity, GERD with esophageal stricture, hyperlipidemia, hypertension, IBS, osteoporosis who presented with significant weakness which is global. Patient said she has gradually felt weak unable to move around. She was on her bedside commode at home when leaned forward on her bed and slipped to the floor. She could not get up from the floor.   On presentation she was found to have right foot diabetic ulcer which she noted since February 2020.  Positive blood cultures x2/2. -enterococcus.  Reported allergy to penicillin.   Right heel wound, MRI 04/21/19 right foot no sign of osteomyelitis.  TEE planned on Wednesday, 04/24/2019.   04/23/19: Patient seen and examined at bedside.  States she feels blue.  TEE planned tomorrow.   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 disorder (HCC)   History of gastric bypass   Morbid obesity ([place])   Hypertension   Atrial fibrillation (HCC)   S/P TAVR (transcatheter aortic valve replacement)   SIRS (systemic inflammatory response syndrome) (HCC)   Pressure injury of skin   Infected wound  Improving generalized weakness/lethargy likely in the setting of active infective process, enterococcus bacteremia and right heel diabetic ulcer Presented with significant weakness, found to have bacteremia and right heel diabetic ulcer Improving, PT OT with no further recommendation for therapy Continue to mobilize with assistance Fall precautions Vital signs reviewed and are stable.  Enterococcus bacteremia,  unclear etiology. Positive blood cultures collected on 04/15/2019 from both aerobic and anaerobic bottles grew gram-positive cocci 2/2.- Enterococcus  Pansensitive to ampicillin, gentamicin, vancomycin Reported allergy to penicillin Penicillin challenge completed No acute issues with Unasyn  No sign of acute allergic reaction to penicillin Repeated blood culture done on 04/17/2019 negative to date, continue to follow. TEE planned on Wednesday, 04/24/2019 per cardiology Infectious disease following. Continue Unasyn Blood cultures x2 drawn on 04/16/2024 negative final. Urine culture drawn on 04/15/2019 no growth, final.  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  Uncontrolled insulin-dependent type 2 diabetes with hyperglycemia Hemoglobin A1c 11.0 Diabetes coordinator following Increased NovoLog 14U  3 times daily with meals Increase Lantus 65 mg twice daily Continue resistant insulin sliding scale  Resolved hypokalemia post repletion   Status post TAVR Plan for TEE to rule out endocarditis Continue IV antibiotics F/u With Dr. Aundra [name] Cardiology consulted for TEE TEE planned on 04/24/2019. N.p.o. after midnight  Right foot diabetic ulcer, POA in the setting of peripheral vascular disease. Wound care specialist consulted Appreciate recommendations  x-ray no acute osseous abnormalities Elevated CRP 10.2, low sed rate 17 on 04/16/2019 MRI right foot done on 04/21/2019 no evidence of osteomyelitis. Continue IV antibiotics Optimize pain control  Peripheral artery disease Poor dorsalis pedis pulses and diminished on right She states she has scheduled surgery on 05/02/2019 by vascular surgery  Paroxysmal A. Fib Rate  controlled on Coreg Continue  Eliquis for CVA prevention Continue to closely monitor on telemetry  Chronic diastolic CHF Continue strict I's and O's and daily weights   DVT prophylaxis: eliquis Code Status: Full Code.  Family Communication:  None at bedside.  Consult: Infectious disease, cardiology, nephrology.   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  Plan to discharge to home when infectious disease and cardiology sign off.  Objective: Vitals:   04/22/19 2036 04/23/19 0329 04/23/19 0500 04/23/19 1244  BP: (!) 154/53 (!) 136/53  123/86  Pulse: 66 66    Resp:  (!) 22    Temp: 97.8 F (36.6 C) (!) 97.4 F (36.3 C)    TempSrc: Oral Oral    SpO2: 100% 97%    Weight:   94 kg   Height:        Intake/Output Summary (Last 24 hours) at 04/23/2019 1442 Last data filed at 04/23/2019 1300 Gross per 24 hour  Intake 1240 ml  Output 1350 ml  Net -110 ml   Filed Weights   04/15/19 2353 04/16/19 0229 04/23/19 0500  Weight: 97.1 kg 93.5 kg 94 kg    Exam:  . General: 68 y.o. year-old female obese no acute distress.  Alert and oriented x3.  .  Cardiovascular: Regular rate and rhythm no rubs or gallops no JVD or thyromegaly noted.   Marland Kitchen Respiratory: Clear to auscultation.  No wheezes or rales.  Good inspiratory effort.   . Abdomen: Nontender nondistended with bowel sounds present. . Musculoskeletal: No lower extremity edema.  Right heel wound with 1 out of 2 dorsalis pedis pulse. . Mood is appropriate for condition and setting.   Data Reviewed: CBC: Recent Labs  Lab 04/17/19 0210 04/19/19 0639 04/23/19 0713  WBC 9.0 8.0 7.5  HGB 12.0 11.8* 12.0  HCT 36.5 35.8* 36.6  MCV 85.5 84.0 82.6  PLT 167 202 267    Basic Metabolic Panel: Recent Labs  Lab 04/17/19 0210 04/19/19 0639 04/20/19 0724 04/22/19 0349 04/23/19 0713  NA 139 139 140 139 139  K 3.9 3.0* 3.5 3.1* 3.5  CL 103 99 99 97* 98  CO2 26 27 26 28 27   GLUCOSE 132* 160* 230* 199* 178*  BUN 40* 61* 82* 93* 95*  CREATININE 2.30* 2.39* 2.41* 2.67* 2.71*  CALCIUM 8.4* 8.6* 9.0 8.9 8.9  MG  --   --   --   --  2.0  PHOS  --   --   --   --  4.2   GFR: Estimated Creatinine Clearance: 23.4 mL/min (A) (by C-G formula based on SCr of 2.71 mg/dL (H)). Liver Function Tests: No results for input(s): AST, ALT, ALKPHOS, BILITOT, PROT, ALBUMIN in the last 168 hours. No results for input(s): LIPASE, AMYLASE in the last 168 hours. No results for input(s): AMMONIA in the last 168 hours. Coagulation Profile: No results for input(s): INR, PROTIME in the last 168 hours. Cardiac Enzymes: No results for input(s): CKTOTAL, CKMB, CKMBINDEX, TROPONINI in the last 168 hours. BNP (last 3 results) No results for input(s): PROBNP in the last 8760 hours. HbA1C: No results for input(s): HGBA1C in the last 72 hours. CBG: Recent Labs  Lab 04/22/19 1229 04/22/19 1717 04/22/19 2130 04/23/19 0746 04/23/19 1217  GLUCAP 213* 239* 352* 196* 271*   Lipid Profile:  No results for input(s): CHOL, HDL, LDLCALC, TRIG, CHOLHDL, LDLDIRECT in the last 72 hours. Thyroid Function Tests: No results for input(s): TSH, T4TOTAL, FREET4, T3FREE, THYROIDAB in the last 72 hours. Anemia Panel: No results for input(s): VITAMINB12, FOLATE, FERRITIN, TIBC, IRON, RETICCTPCT in the last 72 hours. Urine analysis:    Component Value Date/Time   COLORURINE YELLOW 04/15/2019 2310   APPEARANCEUR CLEAR 04/15/2019 2310   APPEARANCEUR Clear 06/19/2018 1637   LABSPEC 1.012 04/15/2019 2310   PHURINE 7.0 04/15/2019 2310   GLUCOSEU >=500 (A) 04/15/2019 2310   HGBUR NEGATIVE 04/15/2019 2310   BILIRUBINUR NEGATIVE 04/15/2019 2310   BILIRUBINUR Negative 06/19/2018 [place] 04/15/2019 2310   PROTEINUR 30 (A) 04/15/2019 2310   UROBILINOGEN 1.0 11/07/2014 2242   NITRITE NEGATIVE 04/15/2019 2310   LEUKOCYTESUR NEGATIVE 04/15/2019 2310   Sepsis Labs: @LABRCNTIP (procalcitonin:4,lacticidven:4)  ) Recent Results (from the past 240 hour(s))  Blood Culture (routine x 2)     Status: Abnormal   Collection Time: 04/15/19  9:07 PM   Specimen: BLOOD  Result Value Ref Range Status   Specimen Description BLOOD RIGHT ANTECUBITAL  Final   Special Requests   Final    BOTTLES DRAWN AEROBIC AND ANAEROBIC Blood Culture results may not be optimal due to an inadequate volume of blood received in culture bottles   Culture  Setup Time   Final    GRAM POSITIVE COCCI IN BOTH AEROBIC AND ANAEROBIC BOTTLES CRITICAL RESULT CALLED TO, READ BACK BY AND VERIFIED WITH: Rondel Oh PharmD 11:10 04/17/19 ([email or web address]) Performed at [facility], [place] [address] [place], [place] [address]    Culture ENTEROCOCCUS FAECALIS (A)  Final   Report Status 04/21/2019 FINAL  Final   Organism ID, Bacteria ENTEROCOCCUS FAECALIS  Final      Susceptibility   Enterococcus faecalis - MIC*    AMPICILLIN <=2 SENSITIVE Sensitive     VANCOMYCIN 2 SENSITIVE Sensitive     GENTAMICIN SYNERGY SENSITIVE Sensitive     * ENTEROCOCCUS FAECALIS  Blood Culture ID Panel (Reflexed)     Status: Abnormal   Collection Time: 04/15/19  9:07 PM  Result Value Ref Range Status   Enterococcus species DETECTED (A) NOT DETECTED Final    Comment: CRITICAL RESULT CALLED TO, READ BACK BY AND VERIFIED WITH: Rondel Oh PharmD 11:10 04/17/19 ([email or web address])    Vancomycin resistance NOT DETECTED NOT DETECTED Final   Listeria monocytogenes NOT DETECTED NOT DETECTED Final   Staphylococcus species NOT DETECTED NOT DETECTED Final   Staphylococcus aureus (BCID) NOT DETECTED NOT DETECTED Final   Streptococcus species NOT DETECTED NOT DETECTED Final   Streptococcus agalactiae NOT DETECTED NOT DETECTED Final   Streptococcus pneumoniae NOT  DETECTED NOT DETECTED Final   Streptococcus pyogenes NOT DETECTED NOT DETECTED Final   Acinetobacter baumannii NOT DETECTED NOT DETECTED Final   Enterobacteriaceae species NOT DETECTED NOT DETECTED Final   Enterobacter cloacae complex NOT DETECTED NOT DETECTED Final   Escherichia coli NOT DETECTED NOT DETECTED Final   Klebsiella oxytoca NOT DETECTED NOT DETECTED Final   Klebsiella pneumoniae NOT DETECTED NOT DETECTED Final   Proteus species NOT DETECTED NOT DETECTED Final   Serratia marcescens NOT DETECTED NOT DETECTED Final   Haemophilus influenzae NOT DETECTED NOT DETECTED Final   Neisseria meningitidis NOT DETECTED NOT DETECTED Final   Pseudomonas aeruginosa NOT DETECTED NOT DETECTED Final   Candida albicans NOT DETECTED NOT DETECTED Final   Candida glabrata NOT DETECTED NOT DETECTED Final   Candida krusei  NOT DETECTED NOT DETECTED Final   Candida parapsilosis NOT DETECTED NOT DETECTED Final   Candida tropicalis NOT DETECTED NOT DETECTED Final    Comment: Performed at [facility], [place] [address], [place], [place] [address]  Blood Culture (routine x 2)     Status: Abnormal   Collection Time: 04/15/19  9:08 PM   Specimen: BLOOD  Result Value Ref Range Status   Specimen Description BLOOD RIGHT WRIST  Final   Special Requests   Final    BOTTLES DRAWN AEROBIC AND ANAEROBIC Blood Culture results may not be optimal due to an inadequate volume of blood received in culture bottles   Culture  Setup Time   Final    GRAM POSITIVE COCCI IN BOTH AEROBIC AND ANAEROBIC BOTTLES CRITICAL VALUE NOTED.  VALUE IS CONSISTENT WITH PREVIOUSLY REPORTED AND CALLED VALUE.    Culture (A)  Final    ENTEROCOCCUS FAECALIS SUSCEPTIBILITIES PERFORMED ON PREVIOUS CULTURE WITHIN THE LAST 5 DAYS. Performed at [facility], [place] [address] [place], [place] [address]    Report Status 04/19/2019 FINAL  Final  Urine culture     Status: None   Collection Time: 04/15/19 11:11 PM   Specimen: In/Out Cath  Urine  Result Value Ref Range Status   Specimen Description IN/OUT CATH URINE  Final   Special Requests NONE  Final   Culture   Final    NO GROWTH Performed at [facility], [place] [address], [place], [place] [address]    Report Status 04/17/2019 FINAL  Final  SARS Coronavirus 2 [facility] order, Performed in [facility] hospital lab) Nasopharyngeal Nasopharyngeal Swab     Status: None   Collection Time: 04/15/19 11:52 PM   Specimen: Nasopharyngeal Swab  Result Value Ref Range Status   SARS Coronavirus 2 NEGATIVE NEGATIVE Final    Comment: (NOTE) If result is NEGATIVE SARS-CoV-2 target nucleic acids are NOT DETECTED. The SARS-CoV-2 RNA is generally detectable in upper and lower  respiratory specimens during the acute phase of infection. The lowest  concentration of SARS-CoV-2 viral copies this assay can detect is 250  copies / mL. A negative result does not preclude SARS-CoV-2 infection  and should not be used as the sole basis for treatment or other  patient management decisions.  A negative result may occur with  improper specimen collection / handling, submission of specimen other  than nasopharyngeal swab, presence of viral mutation(s) within the  areas targeted by this assay, and inadequate number of viral copies  (<250 copies / mL). A negative result must be combined with clinical  observations, patient history, and epidemiological information. If result is POSITIVE SARS-CoV-2 target nucleic acids are DETECTED. The SARS-CoV-2 RNA is generally detectable in upper and lower  respiratory specimens dur ing the acute phase of infection.  Positive  results are indicative of active infection with SARS-CoV-2.  Clinical  correlation with patient history and other diagnostic information is  necessary to determine patient infection status.  Positive results do  not rule out bacterial infection or co-infection with other viruses. If result is PRESUMPTIVE POSTIVE SARS-CoV-2 nucleic  acids MAY BE PRESENT.   A presumptive positive result was obtained on the submitted specimen  and confirmed on repeat testing.  While 2019 novel coronavirus  (SARS-CoV-2) nucleic acids may be present in the submitted sample  additional confirmatory testing may be necessary for epidemiological  and / or clinical management purposes  to differentiate between  SARS-CoV-2 and other Sarbecovirus currently  known to infect humans.  If clinically indicated additional testing with an alternate test  methodology [id number]) is advised. The SARS-CoV-2 RNA is generally  detectable in upper and lower respiratory sp ecimens during the acute  phase of infection. The expected result is Negative. Fact Sheet for Patients:  StrictlyIdeas.no Fact Sheet for Healthcare Providers: BankingDealers.co.za This test is not yet approved or cleared by the [place] FDA and has been authorized for detection and/or diagnosis of SARS-CoV-2 by FDA under an Emergency Use Authorization (EUA).  This EUA will remain in effect (meaning this test can be used) for the duration of the COVID-19 declaration under Section 564(b)(1) of the Act, 21 U.S.C. section 360bbb-3(b)(1), unless the authorization is terminated or revoked sooner. Performed at [facility], [place] [address] [place], [place] [address]   Culture, blood (routine x 2)     Status: None   Collection Time: 04/17/19  3:15 PM   Specimen: BLOOD  Result Value Ref Range Status   Specimen Description BLOOD RIGHT ANTECUBITAL  Final   Special Requests   Final    BOTTLES DRAWN AEROBIC AND ANAEROBIC Blood Culture results may not be optimal due to an excessive volume of blood received in culture bottles   Culture   Final    NO GROWTH 5 DAYS Performed at [facility], [place] [address], [place],  [address]    Report Status 04/22/2019 FINAL  Final  Culture, blood (routine x 2)     Status: None   Collection  Time: 04/17/19  3:23 PM   Specimen: BLOOD  Result Value Ref Range Status   Specimen Description BLOOD BLOOD RIGHT FOREARM  Final   Special Requests   Final    BOTTLES DRAWN AEROBIC AND ANAEROBIC Blood Culture results may not be optimal due to an excessive volume of blood received in culture bottles   Culture   Final    NO GROWTH 5 DAYS Performed at [facility], [place] [address] [place],  [address]    Report Status 04/22/2019 FINAL  Final      Studies: No results found.  Scheduled Meds: . apixaban  5 mg Oral BID  . calcium-vitamin D  1 tablet Oral 3 times weekly  . carvedilol  25 mg Oral BID  . cholecalciferol  2,000 Units Oral Daily  . fenofibrate  54 mg Oral Daily  . hydrALAZINE  10 mg Oral Q6H  . insulin aspart  0-20 Units Subcutaneous TID WC  . insulin aspart  0-5 Units Subcutaneous QHS  . insulin aspart  14 Units Subcutaneous TID WC  . insulin glargine  65 Units Subcutaneous BID  . mupirocin ointment  1 application Topical BID  . nutrition supplement (JUVEN)  1 packet Oral BID BM  . pravastatin  40 mg Oral q1800  . prenatal vitamin w/FE, FA  1 tablet Oral 3 times weekly  . Ensure Max Protein  11 oz Oral QHS  . torsemide  20 mg Oral BID  . vitamin E  400 Units Oral 3 times weekly    Continuous Infusions: . ampicillin-sulbactam (UNASYN) IV 3 g (04/23/19 1015)     LOS: 7 days     Kayleen Memos, MD [facility] Pager [phone]  If 7PM-7AM, please contact night-coverage [email or web address] Password [email or web address] 04/23/2019, 2:42 PM

## 2019-04-24 ENCOUNTER — Inpatient Hospital Stay (HOSPITAL_COMMUNITY): Payer: Medicare Other | Admitting: Anesthesiology

## 2019-04-24 ENCOUNTER — Inpatient Hospital Stay (HOSPITAL_COMMUNITY): Payer: Medicare Other

## 2019-04-24 ENCOUNTER — Encounter (HOSPITAL_COMMUNITY): Payer: Self-pay | Admitting: *Deleted

## 2019-04-24 ENCOUNTER — Encounter (HOSPITAL_COMMUNITY)
Admission: EM | Disposition: A | Discharge status: Home Health | Payer: Self-pay | Source: Home / Self Care | Attending: Internal Medicine

## 2019-04-24 DIAGNOSIS — R7881 Bacteremia: Secondary | ICD-10-CM

## 2019-04-24 DIAGNOSIS — I351 Nonrheumatic aortic (valve) insufficiency: Secondary | ICD-10-CM

## 2019-04-24 DIAGNOSIS — I058 Other rheumatic mitral valve diseases: Secondary | ICD-10-CM | POA: Insufficient documentation

## 2019-04-24 DIAGNOSIS — B952 Enterococcus as the cause of diseases classified elsewhere: Secondary | ICD-10-CM

## 2019-04-24 DIAGNOSIS — I059 Rheumatic mitral valve disease, unspecified: Secondary | ICD-10-CM | POA: Insufficient documentation

## 2019-04-24 DIAGNOSIS — I330 Acute and subacute infective endocarditis: Secondary | ICD-10-CM

## 2019-04-24 HISTORY — PX: TEE WITHOUT CARDIOVERSION: SHX5443

## 2019-04-24 LAB — C3 COMPLEMENT: C3 Complement: 139 mg/dL (ref 82–167)

## 2019-04-24 LAB — BASIC METABOLIC PANEL
Anion gap: 13 (ref 5–15)
BUN: 101 mg/dL — ABNORMAL HIGH (ref 8–23)
CO2: 28 mmol/L (ref 22–32)
Calcium: 8.9 mg/dL (ref 8.9–10.3)
Chloride: 99 mmol/L (ref 98–111)
Creatinine, Ser: 2.6 mg/dL — ABNORMAL HIGH (ref 0.44–1.00)
GFR calc Af Amer: 21 mL/min — ABNORMAL LOW (ref >60)
GFR calc non Af Amer: 18 mL/min — ABNORMAL LOW (ref >60)
Glucose, Bld: 184 mg/dL — ABNORMAL HIGH (ref 70–99)
Potassium: 3.5 mmol/L (ref 3.5–5.1)
Sodium: 140 mmol/L (ref 135–145)

## 2019-04-24 LAB — GLUCOSE, CAPILLARY
Glucose-Capillary: 106 mg/dL — ABNORMAL HIGH (ref 70–99)
Glucose-Capillary: 116 mg/dL — ABNORMAL HIGH (ref 70–99)
Glucose-Capillary: 236 mg/dL — ABNORMAL HIGH (ref 70–99)
Glucose-Capillary: 173 mg/dL — ABNORMAL HIGH (ref 70–99)

## 2019-04-24 LAB — C4 COMPLEMENT: Complement C4, Body Fluid: 16 mg/dL (ref 14–44)

## 2019-04-24 SURGERY — ECHOCARDIOGRAM, TRANSESOPHAGEAL
Anesthesia: Monitor Anesthesia Care

## 2019-04-24 MED ORDER — PHENYLEPHRINE 40 MCG/ML (10ML) SYRINGE FOR IV PUSH (FOR BLOOD PRESSURE SUPPORT)
PREFILLED_SYRINGE | INTRAVENOUS | Status: DC | PRN
  Administered 2019-04-24 (×2): 80 ug via INTRAVENOUS

## 2019-04-24 MED ORDER — PROPOFOL 500 MG/50ML IV EMUL
INTRAVENOUS | Status: DC | PRN
  Administered 2019-04-24: 75 ug/kg/min via INTRAVENOUS

## 2019-04-24 MED ORDER — SODIUM CHLORIDE 0.9 % IV SOLN
INTRAVENOUS | Status: DC
  Administered 2019-04-24: 11:00:00 via INTRAVENOUS

## 2019-04-24 MED ORDER — PROPOFOL 10 MG/ML IV BOLUS
INTRAVENOUS | Status: DC | PRN
  Administered 2019-04-24 (×2): 20 mg via INTRAVENOUS

## 2019-04-24 MED ORDER — LIDOCAINE 2% (20 MG/ML) 5 ML SYRINGE
INTRAMUSCULAR | Status: DC | PRN
  Administered 2019-04-24: 40 mg via INTRAVENOUS

## 2019-04-24 NOTE — Progress Notes (Signed)
Admit: 04/15/2019 LOS: 8  31F with CKD 3/4 and labile creatinine, unclear if has AKI or just fluctuation at the current time.  Admitted for enterococcal bacteremia in setting of TAVR, chronic right nonhealing wound with recent angioplasty.  Subjective:  . TEE this morning . Normal C3 and C4, UA without hematuria or pyuria.  No significant proteinuria . BP stable . UOP partially recorded, nonoliguric   09/08 0701 - 09/09 0700 In: 1472.8 [P.O.:1250; IV Piggyback:222.8] Out: 850 [Urine:850]  Filed Weights   04/15/19 2353 04/16/19 0229 04/23/19 0500  Weight: 97.1 kg 93.5 kg 94 kg    Scheduled Meds: . apixaban  5 mg Oral BID  . calcium-vitamin D  1 tablet Oral 3 times weekly  . carvedilol  25 mg Oral BID  . cholecalciferol  2,000 Units Oral Daily  . fenofibrate  54 mg Oral Daily  . hydrALAZINE  10 mg Oral Q6H  . insulin aspart  0-20 Units Subcutaneous TID WC  . insulin aspart  0-5 Units Subcutaneous QHS  . insulin aspart  14 Units Subcutaneous TID WC  . insulin glargine  65 Units Subcutaneous BID  . mupirocin ointment  1 application Topical BID  . nutrition supplement (JUVEN)  1 packet Oral BID BM  . pravastatin  40 mg Oral q1800  . prenatal vitamin w/FE, FA  1 tablet Oral 3 times weekly  . Ensure Max Protein  11 oz Oral QHS  . vitamin E  400 Units Oral 3 times weekly   Continuous Infusions: . ampicillin-sulbactam (UNASYN) IV Stopped (04/23/19 2144)   PRN Meds:.acetaminophen, meclizine, ondansetron **OR** ondansetron (ZOFRAN) IV, polyvinyl alcohol, sodium chloride  Current Labs: reviewed  Results for ANDILYN, BETTCHER (MRN [id number]) as of 04/24/2019 16:41  Ref. Range 04/23/2019 16:26  C3 Complement Latest Ref Range: 82 - 167 mg/dL 139  Complement C4, Body Fluid Latest Ref Range: 14 - 44 mg/dL 16    Physical Exam:  Blood pressure 130/79, pulse (!) 57, temperature 97.7 F (36.5 C), temperature source Oral, resp. rate 16, height 5\' 7"  (1.702 m), weight 94 kg, SpO2 100 %. GEN:  Chronically ill-appearing, obese, lying flat in the bed, NAD ENT: NCAT EYES: EOMI CV: Irregular, normal S1-S2 PULM: Coarse bilaterally, diminished in bases ABD: Soft, nontender, nondistended SKIN: No rashes or lesions EXT: No edema  A 1. CKD 3/4 with possible AKI, nonoliguric; likely that any acute component is driven by #2.  Imaging negative for obstruction upon presentation.  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  Suspect baseline kidney disease driven by diabetic nephropathy and her other medical issues 2. Enterococcal faecalis bacteremia on Unasyn, RCID following, TEE tomorrow 3. Status post TAVR, diastolic congestive heart failure 4. A. fib, on NOAC 5. Morbid obesity status post gastric sleeve 6. DM2 7. Hypertension, blood pressure stable, ARB has been held 8. Hyperlipidemia on Praluent  P . Cont supportive care . Will follow for another 24h . Medication Issues; o Preferred narcotic agents for pain control are hydromorphone, fentanyl, and methadone. Morphine should not be used.  o Baclofen should be avoided o Avoid oral sodium phosphate and magnesium citrate based laxatives / bowel preps    Pearson Grippe MD 04/24/2019, 4:12 PM  Recent Labs  Lab 04/22/19 0349 04/23/19 0713 04/24/19 0156  NA 139 139 140  K 3.1* 3.5 3.5  CL 97* 98 99  CO2 28 27 28   GLUCOSE 199* 178* 184*  BUN 93* 95* 101*  CREATININE 2.67* 2.71* 2.60*  CALCIUM 8.9 8.9 8.9  PHOS  --  4.2  --    Recent Labs  Lab 04/19/19 0639 04/23/19 0713  WBC 8.0 7.5  HGB 11.8* 12.0  HCT 35.8* 36.6  MCV 84.0 82.6  PLT 202 276

## 2019-04-24 NOTE — Progress Notes (Signed)
PROGRESS NOTE  [name] VVO:[id number] DOB: Apr 01, 1951 DOA: 04/15/2019 PCP: [name], DO  HPI/Recap of past 24 hours: [name] is an 68 y.o. female with medical history significant ofatrial fibrillation, chronic kidney disease stage III, morbid obesity, GERD with esophageal stricture, hyperlipidemia, hypertension, IBS, osteoporosis who presented with significant weakness which is global. Patient said she has gradually felt weak unable to move around. She was on her bedside commode at home when leaned forward on her bed and slipped to the floor. She could not get up from the floor.   On presentation she was found to have right foot diabetic ulcer which she noted since February 2020.  Positive blood cultures x2/2. -enterococcus.  Reported allergy to penicillin.   Right heel wound, MRI 04/21/19 right foot no sign of osteomyelitis.  TEE planned on Wednesday, 04/24/2019.   04/24/19: Patient seen and examined at her bedside this morning.  She is somnolent but easily arousable to voices.  She has no new complaints.    TEE has been completed.  Findings as stated below:  Large, heavily calcified, slightly mobile segment of the posterior mitral annulus. More likely degenerative change, rather than vegetation.  There is no significant mitral insufficiency. TAVR prosthesis well seen. There are no vegetations seen. There are three jts of aortic insufficiency:   Will await for infectious disease to decide on course of antibiotics prior to ordering IJ line by IR.   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 disorder (HCC)   History of gastric bypass   Morbid obesity ([place])   Hypertension   Atrial fibrillation (HCC)   S/P TAVR (transcatheter aortic valve replacement)   SIRS (systemic  inflammatory response syndrome) (HCC)   Pressure injury of skin   Infected wound   Bacteremia due to Enterococcus  Improving generalized weakness/lethargy likely in the setting of active infective process, enterococcus bacteremia and right heel diabetic ulcer Presented with significant weakness, found to have bacteremia and right heel diabetic ulcer Improving, PT OT with no further recommendation for therapy Continue to mobilize with assistance Fall precautions Vital signs reviewed and are stable.  Enterococcus bacteremia, unclear etiology. Positive blood cultures collected on 04/15/2019 from both aerobic and anaerobic bottles grew gram-positive cocci 2/2.- Enterococcus  Pansensitive to ampicillin, gentamicin, vancomycin Reported allergy to penicillin Penicillin challenge completed No acute issues with Unasyn  No sign of acute allergic reaction to penicillin Repeated blood culture done on 04/17/2019 negative to date, continue to follow. TEE completed with findings as stated above. Seen by infectious disease, following Awaiting infectious disease to decide course of treatment in light of TEE findings Currently on Unasyn Blood cultures x2 drawn on 04/16/2024 negative final. Urine culture drawn on 04/15/2019 no growth, final.  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  Hypokalemia Potassium 3.1 Repleted with KCl 40 mEq once  Uncontrolled insulin-dependent type 2 diabetes with hyperglycemia Hemoglobin A1c 11.0 Diabetes coordinator following Increased NovoLog 14U  3 times daily with meals Increase Lantus 65  mg twice daily Continue resistant  insulin sliding scale  Status post TAVR with possible leak From TEE findings 04/24/19 Continue IV antibiotics F/u With Dr. Aundra Dubin Defer management to cardiology  Right foot diabetic ulcer, POA in the setting of peripheral vascular disease. Wound care specialist consulted Appreciate recommendations  x-ray no acute osseous abnormalities Elevated CRP 10.2, low sed rate 17 on 04/16/2019 MRI right foot done on 04/21/2019 no evidence of osteomyelitis. Continue IV antibiotics Optimize pain control  Peripheral artery disease Poor dorsalis pedis pulses and diminished on right She states she has scheduled surgery on 05/02/2019 by vascular surgery  Paroxysmal A. Fib Rate controlled on Coreg Continue Eliquis for CVA prevention Continue to closely monitor on telemetry  Chronic diastolic CHF Continue strict I's and O's and daily weights   DVT prophylaxis: eliquis Code Status: Full Code.  Family Communication:  None at bedside.  Consult: Infectious disease, cardiology, nephrology.   Disposition Plan:  Patient is currently not appropriate for discharge due to enterococcus bacteremia requiring IV antibiotics, uncontrolled insulin-dependent type 2 diabetes, worsening AKI on CKD 4, in the setting of multiple comorbidities and advanced age.  Plan to discharge to home when infectious disease and cardiology sign off.  Objective: Vitals:   04/23/19 2100 04/24/19 0528 04/24/19 1055 04/24/19 1134  BP: 123/86 135/65 (!) 168/63 (!) 129/54  Pulse: 69 64 70 61  Resp: 17 18 17  (!) 23  Temp: 98.3 F (36.8 C) 97.8 F (36.6 C) (!) 97.2 F (36.2 C) (!) 97.3 F (36.3 C)  TempSrc: Oral Oral Temporal Temporal  SpO2: 96% 96% 96% 100%  Weight:      Height:        Intake/Output Summary (Last 24 hours) at 04/24/2019 1148 Last data filed at 04/24/2019 1136 Gross per 24 hour  Intake 1092.77 ml  Output 850 ml  Net 242.77 ml   Filed Weights   04/15/19 2353 04/16/19 0229 04/23/19 0500  Weight: 97.1 kg 93.5  kg 94 kg    Exam:  . General: 68 y.o. year-old female obese in no acute distress.  Somnolent but easily arousable to voices.  .  Cardiovascular: Regular rate and rhythm no rubs or gallops.  No JVD or thyromegaly noted.   Marland Kitchen Respiratory: Clear to auscultation no wheezes or rales.  Poor inspiratory effort.  .  Abdomen: Obese nontender normal bowel sounds present. . Musculoskeletal: No lower extremity edema.  Right heel wound with 1 out of 2 dorsalis pedis pulses noted.   . Mood is appropriate for condition and setting.   Data Reviewed: CBC: Recent Labs  Lab 04/19/19 0639 04/23/19 0713  WBC 8.0 7.5  HGB 11.8* 12.0  HCT 35.8* 36.6  MCV 84.0 82.6  PLT 202 027   Basic Metabolic Panel: Recent Labs  Lab 04/19/19 0639 04/20/19 0724 04/22/19 0349 04/23/19 0713 04/24/19 0156  NA 139 140 139 139 140  K 3.0* 3.5 3.1* 3.5 3.5  CL 99 99 97* 98 99  CO2 27 26 28 27 28   GLUCOSE 160* 230* 199* 178* 184*  BUN 61* 82* 93* 95* 101*  CREATININE 2.39* 2.41* 2.67* 2.71* 2.60*  CALCIUM 8.6* 9.0 8.9 8.9 8.9  MG  --   --   --  2.0  --   PHOS  --   --   --  4.2  --    GFR: Estimated Creatinine Clearance: 24.4 mL/min (A) (by C-G formula based on SCr of 2.6 mg/dL (H)). Liver Function Tests: No results for input(s): AST,  ALT, ALKPHOS, BILITOT, PROT, ALBUMIN in the last 168 hours. No results for input(s): LIPASE, AMYLASE in the last 168 hours. No results for input(s): AMMONIA in the last 168 hours. Coagulation Profile: No results for input(s): INR, PROTIME in the last 168 hours. Cardiac Enzymes: No results for input(s): CKTOTAL, CKMB, CKMBINDEX, TROPONINI in the last 168 hours. BNP (last 3 results) No results for input(s): PROBNP in the last 8760 hours. HbA1C: No results for input(s): HGBA1C in the last 72 hours. CBG: Recent Labs  Lab 04/23/19 1217 04/23/19 1720 04/23/19 1837 04/23/19 2139 04/24/19 0807  GLUCAP 271* 281* 292* 311* 106*   Lipid Profile: No results for input(s):  CHOL, HDL, LDLCALC, TRIG, CHOLHDL, LDLDIRECT in the last 72 hours. Thyroid Function Tests: No results for input(s): TSH, T4TOTAL, FREET4, T3FREE, THYROIDAB in the last 72 hours. Anemia Panel: No results for input(s): VITAMINB12, FOLATE, FERRITIN, TIBC, IRON, RETICCTPCT in the last 72 hours. Urine analysis:    Component Value Date/Time   COLORURINE STRAW (A) 04/23/2019 1737   APPEARANCEUR CLEAR 04/23/2019 1737   APPEARANCEUR Clear 06/19/2018 1637   LABSPEC 1.011 04/23/2019 1737   PHURINE 5.0 04/23/2019 1737   GLUCOSEU 150 (A) 04/23/2019 1737   HGBUR NEGATIVE 04/23/2019 1737   BILIRUBINUR NEGATIVE 04/23/2019 1737   BILIRUBINUR Negative 06/19/2018 1637   KETONESUR NEGATIVE 04/23/2019 1737   PROTEINUR NEGATIVE 04/23/2019 1737   UROBILINOGEN 1.0 11/07/2014 2242   NITRITE NEGATIVE 04/23/2019 1737   LEUKOCYTESUR NEGATIVE 04/23/2019 1737   Sepsis Labs: @LABRCNTIP (procalcitonin:4,lacticidven:4)  ) Recent Results (from the past 240 hour(s))  Blood Culture (routine x 2)     Status: Abnormal   Collection Time: 04/15/19  9:07 PM   Specimen: BLOOD  Result Value Ref Range Status   Specimen Description BLOOD RIGHT ANTECUBITAL  Final   Special Requests   Final    BOTTLES DRAWN AEROBIC AND ANAEROBIC Blood Culture results may not be optimal due to an inadequate volume of blood received in culture bottles   Culture  Setup Time   Final    GRAM POSITIVE COCCI IN BOTH AEROBIC AND ANAEROBIC BOTTLES CRITICAL RESULT CALLED TO, READ BACK BY AND VERIFIED WITH: Rondel Oh PharmD 11:10 04/17/19 ([email or web address]) Performed at [facility], [place] [address], [place],  [address]    Culture ENTEROCOCCUS FAECALIS (A)  Final   Report Status 04/21/2019 FINAL  Final   Organism ID, Bacteria ENTEROCOCCUS FAECALIS  Final      Susceptibility   Enterococcus faecalis - MIC*    AMPICILLIN <=2 SENSITIVE Sensitive     VANCOMYCIN 2 SENSITIVE Sensitive     GENTAMICIN SYNERGY SENSITIVE Sensitive     * ENTEROCOCCUS  FAECALIS  Blood Culture ID Panel (Reflexed)     Status: Abnormal   Collection Time: 04/15/19  9:07 PM  Result Value Ref Range Status   Enterococcus species DETECTED (A) NOT DETECTED Final    Comment: CRITICAL RESULT CALLED TO, READ BACK BY AND VERIFIED WITH: Rondel Oh PharmD 11:10 04/17/19 ([email or web address])    Vancomycin resistance NOT DETECTED NOT DETECTED Final   Listeria monocytogenes NOT DETECTED NOT DETECTED Final   Staphylococcus species NOT DETECTED NOT DETECTED Final   Staphylococcus aureus (BCID) NOT DETECTED NOT DETECTED Final   Streptococcus species NOT DETECTED NOT DETECTED Final   Streptococcus agalactiae NOT DETECTED NOT DETECTED Final   Streptococcus pneumoniae NOT DETECTED NOT DETECTED Final   Streptococcus pyogenes NOT DETECTED NOT DETECTED Final   Acinetobacter baumannii NOT DETECTED NOT DETECTED Final   Enterobacteriaceae  species NOT DETECTED NOT DETECTED Final   Enterobacter cloacae complex NOT DETECTED NOT DETECTED Final   Escherichia coli NOT DETECTED NOT DETECTED Final   Klebsiella oxytoca NOT DETECTED NOT DETECTED Final   Klebsiella pneumoniae NOT DETECTED NOT DETECTED Final   Proteus species NOT DETECTED NOT DETECTED Final   Serratia marcescens NOT DETECTED NOT DETECTED Final   Haemophilus influenzae NOT DETECTED NOT DETECTED Final   Neisseria meningitidis NOT DETECTED NOT DETECTED Final   Pseudomonas aeruginosa NOT DETECTED NOT DETECTED Final   Candida albicans NOT DETECTED NOT DETECTED Final   Candida glabrata NOT DETECTED NOT DETECTED Final   Candida krusei NOT DETECTED NOT DETECTED Final   Candida parapsilosis NOT DETECTED NOT DETECTED Final   Candida tropicalis NOT DETECTED NOT DETECTED Final    Comment: Performed at [facility], [place] [address] [place], [place] [address]  Blood Culture (routine x 2)     Status: Abnormal   Collection Time: 04/15/19  9:08 PM   Specimen: BLOOD  Result Value Ref Range Status   Specimen Description BLOOD RIGHT WRIST  Final    Special Requests   Final    BOTTLES DRAWN AEROBIC AND ANAEROBIC Blood Culture results may not be optimal due to an inadequate volume of blood received in culture bottles   Culture  Setup Time   Final    GRAM POSITIVE COCCI IN BOTH AEROBIC AND ANAEROBIC BOTTLES CRITICAL VALUE NOTED.  VALUE IS CONSISTENT WITH PREVIOUSLY REPORTED AND CALLED VALUE.    Culture (A)  Final    ENTEROCOCCUS FAECALIS SUSCEPTIBILITIES PERFORMED ON PREVIOUS CULTURE WITHIN THE LAST 5 DAYS. Performed at [facility], [place] [address] [place], [place] [address]    Report Status 04/19/2019 FINAL  Final  Urine culture     Status: None   Collection Time: 04/15/19 11:11 PM   Specimen: In/Out Cath Urine  Result Value Ref Range Status   Specimen Description IN/OUT CATH URINE  Final   Special Requests NONE  Final   Culture   Final    NO GROWTH Performed at [facility], [place] [address], [place], [place] [address]    Report Status 04/17/2019 FINAL  Final  SARS Coronavirus 2 [facility] order, Performed in [facility] hospital lab) Nasopharyngeal Nasopharyngeal Swab     Status: None   Collection Time: 04/15/19 11:52 PM   Specimen: Nasopharyngeal Swab  Result Value Ref Range Status   SARS Coronavirus 2 NEGATIVE NEGATIVE Final    Comment: (NOTE) If result is NEGATIVE SARS-CoV-2 target nucleic acids are NOT DETECTED. The SARS-CoV-2 RNA is generally detectable in upper and lower  respiratory specimens during the acute phase of infection. The lowest  concentration of SARS-CoV-2 viral copies this assay can detect is 250  copies / mL. A negative result does not preclude SARS-CoV-2 infection  and should not be used as the sole basis for treatment or other  patient management decisions.  A negative result may occur with  improper specimen collection / handling, submission of specimen other  than nasopharyngeal swab, presence of viral mutation(s) within the  areas targeted by this assay, and inadequate number of  viral copies  (<250 copies / mL). A negative result must be combined with clinical  observations, patient history, and epidemiological information. If result is POSITIVE SARS-CoV-2 target nucleic acids are DETECTED. The SARS-CoV-2 RNA is generally detectable in upper and lower  respiratory specimens dur ing the acute phase of infection.  Positive  results are indicative  of active infection with SARS-CoV-2.  Clinical  correlation with patient history and other diagnostic information is  necessary to determine patient infection status.  Positive results do  not rule out bacterial infection or co-infection with other viruses. If result is PRESUMPTIVE POSTIVE SARS-CoV-2 nucleic acids MAY BE PRESENT.   A presumptive positive result was obtained on the submitted specimen  and confirmed on repeat testing.  While 2019 novel coronavirus  (SARS-CoV-2) nucleic acids may be present in the submitted sample  additional confirmatory testing may be necessary for epidemiological  and / or clinical management purposes  to differentiate between  SARS-CoV-2 and other Sarbecovirus currently known to infect humans.  If clinically indicated additional testing with an alternate test  methodology [phone]) is advised. The SARS-CoV-2 RNA is generally  detectable in upper and lower respiratory sp ecimens during the acute  phase of infection. The expected result is Negative. Fact Sheet for Patients:  StrictlyIdeas.no Fact Sheet for Healthcare Providers: BankingDealers.co.za This test is not yet approved or cleared by the [place] FDA and has been authorized for detection and/or diagnosis of SARS-CoV-2 by FDA under an Emergency Use Authorization (EUA).  This EUA will remain in effect (meaning this test can be used) for the duration of the COVID-19 declaration under Section 564(b)(1) of the Act, 21 U.S.C. section 360bbb-3(b)(1), unless the authorization is  terminated or revoked sooner. Performed at [facility], [place] [address] [place], [place] [address]   Culture, blood (routine x 2)     Status: None   Collection Time: 04/17/19  3:15 PM   Specimen: BLOOD  Result Value Ref Range Status   Specimen Description BLOOD RIGHT ANTECUBITAL  Final   Special Requests   Final    BOTTLES DRAWN AEROBIC AND ANAEROBIC Blood Culture results may not be optimal due to an excessive volume of blood received in culture bottles   Culture   Final    NO GROWTH 5 DAYS Performed at [facility], [place] [address] [place], [place] [address]    Report Status 04/22/2019 FINAL  Final  Culture, blood (routine x 2)     Status: None   Collection Time: 04/17/19  3:23 PM   Specimen: BLOOD  Result Value Ref Range Status   Specimen Description BLOOD BLOOD RIGHT FOREARM  Final   Special Requests   Final    BOTTLES DRAWN AEROBIC AND ANAEROBIC Blood Culture results may not be optimal due to an excessive volume of blood received in culture bottles   Culture   Final    NO GROWTH 5 DAYS Performed at [facility], [place] [address], [place], [place] [address]    Report Status 04/22/2019 FINAL  Final      Studies: No results found.  Scheduled Meds: . [MAR Hold] apixaban  5 mg Oral BID  . [MAR Hold] calcium-vitamin D  1 tablet Oral 3 times weekly  . [MAR Hold] carvedilol  25 mg Oral BID  . [MAR Hold] cholecalciferol  2,000 Units Oral Daily  . [MAR Hold] fenofibrate  54 mg Oral Daily  . [MAR Hold] hydrALAZINE  10 mg Oral Q6H  . [MAR Hold] insulin aspart  0-20 Units Subcutaneous TID WC  . [MAR Hold] insulin aspart  0-5 Units Subcutaneous QHS  . [MAR Hold] insulin aspart  14 Units Subcutaneous TID WC  . [MAR Hold] insulin glargine  65 Units Subcutaneous BID  . [MAR Hold] mupirocin ointment  1 application Topical BID  . [facility]  Hold] nutrition supplement (JUVEN)  1 packet Oral BID BM  . [MAR Hold] pravastatin  40 mg Oral q1800  . [MAR Hold] prenatal vitamin  w/FE, FA  1 tablet Oral 3 times weekly  . [MAR Hold] Ensure Max Protein  11 oz Oral QHS  . [MAR Hold] vitamin E  400 Units Oral 3 times weekly    Continuous Infusions: . sodium chloride 20 mL/hr at 04/24/19 1056  . [MAR Hold] ampicillin-sulbactam (UNASYN) IV Stopped (04/23/19 2144)     LOS: 8 days     Kayleen Memos, MD [facility] Pager [phone]  If 7PM-7AM, please contact night-coverage [email or web address] Password [email or web address] 04/24/2019, 11:48 AM

## 2019-04-24 NOTE — Anesthesia Preprocedure Evaluation (Addendum)
Anesthesia Evaluation  Patient identified by MRN, date of birth, ID band Patient awake    Reviewed: Allergy & Precautions, NPO status , Patient's Chart, lab work & pertinent test results  History of Anesthesia Complications (+) DIFFICULT AIRWAY  Airway Mallampati: IV  TM Distance: <3 FB Neck ROM: Full    Dental no notable dental hx. (+) Teeth Intact   Pulmonary sleep apnea ,    Pulmonary exam normal breath sounds clear to auscultation       Cardiovascular Exercise Tolerance: Good hypertension, + CAD, + Peripheral Vascular Disease and +CHF  Normal cardiovascular exam Rhythm:Regular Rate:Normal     Neuro/Psych TIACVA negative psych ROS   GI/Hepatic negative GI ROS, Neg liver ROS,   Endo/Other  diabetes  Renal/GU negative Renal ROS     Musculoskeletal   Abdominal   Peds  Hematology negative hematology ROS (+)   Anesthesia Other Findings   Reproductive/Obstetrics                            Anesthesia Physical Anesthesia Plan  ASA: III  Anesthesia Plan: MAC   Post-op Pain Management:    Induction: Intravenous  PONV Risk Score and Plan: Treatment may vary due to age or medical condition, Ondansetron and Dexamethasone  Airway Management Planned: Nasal Cannula and Natural Airway  Additional Equipment:   Intra-op Plan:   Post-operative Plan:   Informed Consent: I have reviewed the patients History and Physical, chart, labs and discussed the procedure including the risks, benefits and alternatives for the proposed anesthesia with the patient or authorized representative who has indicated his/her understanding and acceptance.     Dental advisory given  Plan Discussed with: CRNA  Anesthesia Plan Comments:         Anesthesia Quick Evaluation

## 2019-04-24 NOTE — Anesthesia Postprocedure Evaluation (Signed)
Anesthesia Post Note  Patient: [name]  Procedure(s) Performed: TRANSESOPHAGEAL ECHOCARDIOGRAM (TEE) (N/A )     Patient location during evaluation: Endoscopy Anesthesia Type: MAC Level of consciousness: awake and alert Pain management: pain level controlled Vital Signs Assessment: post-procedure vital signs reviewed and stable Respiratory status: spontaneous breathing, nonlabored ventilation, respiratory function stable and patient connected to nasal cannula oxygen Cardiovascular status: blood pressure returned to baseline and stable Postop Assessment: no apparent nausea or vomiting Anesthetic complications: no    Last Vitals:  Vitals:   04/24/19 1154 04/24/19 1247  BP: (!) 159/52 130/79  Pulse: 63 (!) 57  Resp: 13 16  Temp:  36.5 C  SpO2: 97% 100%    Last Pain:  Vitals:   04/24/19 1247  TempSrc: Oral  PainSc:                  Barnet Glasgow

## 2019-04-24 NOTE — Progress Notes (Signed)
Patient refused to have a dressing change on her right heel wound.  Will endorse to day shift RN appropriately.

## 2019-04-24 NOTE — Anesthesia Procedure Notes (Signed)
Procedure Name: MAC Date/Time: 04/24/2019 11:16 AM Performed by: Renato Shin, CRNA Pre-anesthesia Checklist: Patient identified, Emergency Drugs available, Suction available and Patient being monitored Patient Re-evaluated:Patient Re-evaluated prior to induction Oxygen Delivery Method: Simple face mask Preoxygenation: Pre-oxygenation with 100% oxygen Induction Type: IV induction Placement Confirmation: positive ETCO2 and breath sounds checked- equal and bilateral Dental Injury: Teeth and Oropharynx as per pre-operative assessment

## 2019-04-24 NOTE — Care Management Important Message (Signed)
Important Message  Patient Details  Name: [name] MRN: [id number] Date of Birth: Jan 26, 1951   Medicare Important Message Given:  Yes     Memory Argue 04/24/2019, 4:20 PM

## 2019-04-24 NOTE — Transfer of Care (Signed)
Immediate Anesthesia Transfer of Care Note  Patient: [name]  Procedure(s) Performed: TRANSESOPHAGEAL ECHOCARDIOGRAM (TEE) (N/A )  Patient Location: PACU and Endoscopy Unit  Anesthesia Type:MAC  Level of Consciousness: awake, drowsy and patient cooperative  Airway & Oxygen Therapy: Patient Spontanous Breathing and Patient connected to face mask oxygen  Post-op Assessment: Report given to RN and Post -op Vital signs reviewed and stable  Post vital signs: Reviewed and stable  Last Vitals:  Vitals Value Taken Time  BP 124/22 04/24/19 1135  Temp    Pulse 61 04/24/19 1135  Resp 23 04/24/19 1135  SpO2 99 % 04/24/19 1135  Vitals shown include unvalidated device data.  Last Pain:  Vitals:   04/24/19 1055  TempSrc: Temporal  PainSc: 0-No pain         Complications: No apparent anesthesia complications

## 2019-04-24 NOTE — Progress Notes (Signed)
[place] [facility]   Reason for visit: Follow up on Enterococcal sepsis  Antibiotics: Unasyn, day 7  Interval History: TEE performed today showed no vegetation to her TAVR apparatus (slight perivalvular leak was seen though), however there was concern for a possible mitral valve vegetation instead. Pt still insistent she does not need care for her RT ankle/heel wound as her daughter is a wound care nurse and can tend to it. Glycemic improved while she was NPO awaiting today's procedure. Fever curve, WBC and CR trends, cx results, imaging and ABX usage all independently reviewed    Current Facility-Administered Medications:  .  acetaminophen (TYLENOL) tablet 650 mg, 650 mg, Oral, Q6H PRN, Irene Pap N, DO, 650 mg at 04/18/19 1214 .  Ampicillin-Sulbactam (UNASYN) 3 g in sodium chloride 0.9 % 100 mL IVPB, 3 g, Intravenous, Q12H, Kayleen Memos, DO, Stopped at 04/23/19 2144 .  apixaban (ELIQUIS) tablet 5 mg, 5 mg, Oral, BID, Jonelle Sidle, Mohammad L, MD, 5 mg at 04/24/19 1418 .  calcium-vitamin D (OSCAL WITH D) 500-200 MG-UNIT per tablet 1 tablet, 1 tablet, Oral, 3 times weekly, Elwyn Reach, MD, 1 tablet at 04/22/19 0945 .  carvedilol (COREG) tablet 25 mg, 25 mg, Oral, BID, Jonelle Sidle, Mohammad L, MD, 25 mg at 04/24/19 1416 .  cholecalciferol (VITAMIN D3) tablet 2,000 Units, 2,000 Units, Oral, Daily, Elwyn Reach, MD, 2,000 Units at 04/24/19 1416 .  fenofibrate tablet 54 mg, 54 mg, Oral, Daily, Jonelle Sidle, Mohammad L, MD, 54 mg at 04/24/19 1418 .  hydrALAZINE (APRESOLINE) tablet 10 mg, 10 mg, Oral, Q6H, Garba, Mohammad L, MD, 10 mg at 04/24/19 0535 .  insulin aspart (novoLOG) injection 0-20 Units, 0-20 Units, Subcutaneous, TID WC, Kayleen Memos, DO, 11 Units at 04/23/19 1843 .  insulin aspart (novoLOG) injection 0-5 Units, 0-5 Units, Subcutaneous, QHS, Kayleen Memos, DO, 4 Units at 04/23/19 2142 .  insulin aspart (novoLOG) injection 14 Units, 14 Units, Subcutaneous, TID WC,  Kayleen Memos, DO, 14 Units at 04/23/19 1843 .  insulin glargine (LANTUS) injection 65 Units, 65 Units, Subcutaneous, BID, Kayleen Memos, [place], 65 Units at 04/23/19 2139 .  meclizine (ANTIVERT) tablet 12.5 mg, 12.5 mg, Oral, TID PRN, Jonelle Sidle, Mohammad L, MD .  mupirocin ointment (BACTROBAN) 2 % 1 application, 1 application, Topical, BID, Elwyn Reach, MD, 1 application at 98/26/41 1419 .  nutrition supplement (JUVEN) (JUVEN) powder packet 1 packet, 1 packet, Oral, BID BM, Kayleen Memos, DO, 1 packet at 04/24/19 1419 .  ondansetron (ZOFRAN) tablet 4 mg, 4 mg, Oral, Q6H PRN **OR** ondansetron (ZOFRAN) injection 4 mg, 4 mg, Intravenous, Q6H PRN, Garba, Mohammad L, MD .  polyvinyl alcohol (LIQUIFILM TEARS) 1.4 % ophthalmic solution 1 drop, 1 drop, Both Eyes, Daily PRN, Jonelle Sidle, Mohammad L, MD .  pravastatin (PRAVACHOL) tablet 40 mg, 40 mg, Oral, q1800, Gala Romney L, MD, 40 mg at 04/23/19 1816 .  prenatal vitamin w/FE, FA (PRENATAL 1 + 1) 27-1 MG tablet 1 tablet, 1 tablet, Oral, 3 times weekly, Elwyn Reach, MD, 1 tablet at 04/24/19 1417 .  protein supplement (ENSURE MAX) liquid, 11 oz, Oral, QHS, Hall, Carole N, DO, 11 oz at 04/21/19 2233 .  sodium chloride (OCEAN) 0.65 % nasal spray 1 spray, 1 spray, Each Nare, PRN, Jonelle Sidle, Mohammad L, MD .  vitamin E capsule 400 Units, 400 Units, Oral, 3 times weekly, Elwyn Reach, MD, 400 Units at 04/24/19 1418   Physical Exam:  Vitals:   04/24/19 1154 04/24/19 1247  BP: (!) 159/52 130/79  Pulse: 63 (!) 57  Resp: 13 16  Temp:  97.7 F (36.5 C)  SpO2: 97% 100%   Physical Exam Gen: obese, NAD, A&Ox 3 Head: NCAT, no temporal wasting evident EENT: PERRL, EOMI, MMM, adequate dentition Neck: supple, no JVD CV: NRRR, I/VI diastolic murmur at LUSB appreciated Pulm: CTA bilaterally, no wheeze or retractions Abd: soft, obese, well-healed midline incision, NTND, +BS Extrems:  trace LE edema, 2+ pulses MSK: half dollar sized ulcer at RT malleolus  with overlying eschar making wound unstageable, +improving surrounding erythema but no drainage evident Skin: chronic venous stasis to distal LEs, adequate skin turgor Neuro: CN II-XII grossly intact, no focal neurologic deficits appreciated, gait was not assessed, A&Ox 3  Review of Systems:  Review of Systems  Constitutional: Positive for malaise/fatigue. Negative for chills, fever and weight loss.  HENT: Negative for congestion, hearing loss, sinus pain and sore throat.   Eyes: Negative for blurred vision, photophobia and discharge.  Respiratory: Negative for cough, hemoptysis and shortness of breath.   Cardiovascular: Negative for chest pain, palpitations, orthopnea and leg swelling.  Gastrointestinal: Negative for abdominal pain, constipation, diarrhea, heartburn, nausea and vomiting.  Genitourinary: Negative for dysuria, flank pain, frequency and urgency.       +urinary incontinence x 7 months  Musculoskeletal: Positive for joint pain. Negative for back pain and myalgias.       RT ankle/heel pain  Skin: Negative for itching and rash.  Neurological: Positive for weakness. Negative for tremors, seizures and headaches.  Endo/Heme/Allergies: Negative for polydipsia. Does not bruise/bleed easily.  Psychiatric/Behavioral: Negative for depression and substance abuse. The patient is not nervous/anxious and does not have insomnia.      Lab Results  Component Value Date   WBC 7.5 04/23/2019   HGB 12.0 04/23/2019   HCT 36.6 04/23/2019   MCV 82.6 04/23/2019   PLT 276 04/23/2019    Lab Results  Component Value Date   CREATININE 2.60 (H) 04/24/2019   BUN 101 (H) 04/24/2019   NA 140 04/24/2019   K 3.5 04/24/2019   CL 99 04/24/2019   CO2 28 04/24/2019    Lab Results  Component Value Date   ALT 17 04/16/2019   AST 19 04/16/2019   ALKPHOS 74 04/16/2019     Microbiology: Recent Results (from the past 240 hour(s))  Blood Culture (routine x 2)     Status: Abnormal   Collection Time:  04/15/19  9:07 PM   Specimen: BLOOD  Result Value Ref Range Status   Specimen Description BLOOD RIGHT ANTECUBITAL  Final   Special Requests   Final    BOTTLES DRAWN AEROBIC AND ANAEROBIC Blood Culture results may not be optimal due to an inadequate volume of blood received in culture bottles   Culture  Setup Time   Final    GRAM POSITIVE COCCI IN BOTH AEROBIC AND ANAEROBIC BOTTLES CRITICAL RESULT CALLED TO, READ BACK BY AND VERIFIED WITH: Rondel Oh PharmD 11:10 04/17/19 ([email or web address]) Performed at [facility], [place] [address], [place], [place] [address]    Culture ENTEROCOCCUS FAECALIS (A)  Final   Report Status 04/21/2019 FINAL  Final   Organism ID, Bacteria ENTEROCOCCUS FAECALIS  Final      Susceptibility   Enterococcus faecalis - MIC*    AMPICILLIN <=2 SENSITIVE Sensitive     VANCOMYCIN 2 SENSITIVE Sensitive     GENTAMICIN SYNERGY SENSITIVE Sensitive     *  ENTEROCOCCUS FAECALIS  Blood Culture ID Panel (Reflexed)     Status: Abnormal   Collection Time: 04/15/19  9:07 PM  Result Value Ref Range Status   Enterococcus species DETECTED (A) NOT DETECTED Final    Comment: CRITICAL RESULT CALLED TO, READ BACK BY AND VERIFIED WITH: Rondel Oh PharmD 11:10 04/17/19 ([email or web address])    Vancomycin resistance NOT DETECTED NOT DETECTED Final   Listeria monocytogenes NOT DETECTED NOT DETECTED Final   Staphylococcus species NOT DETECTED NOT DETECTED Final   Staphylococcus aureus (BCID) NOT DETECTED NOT DETECTED Final   Streptococcus species NOT DETECTED NOT DETECTED Final   Streptococcus agalactiae NOT DETECTED NOT DETECTED Final   Streptococcus pneumoniae NOT DETECTED NOT DETECTED Final   Streptococcus pyogenes NOT DETECTED NOT DETECTED Final   Acinetobacter baumannii NOT DETECTED NOT DETECTED Final   Enterobacteriaceae species NOT DETECTED NOT DETECTED Final   Enterobacter cloacae complex NOT DETECTED NOT DETECTED Final   Escherichia coli NOT DETECTED NOT DETECTED Final   Klebsiella oxytoca NOT  DETECTED NOT DETECTED Final   Klebsiella pneumoniae NOT DETECTED NOT DETECTED Final   Proteus species NOT DETECTED NOT DETECTED Final   Serratia marcescens NOT DETECTED NOT DETECTED Final   Haemophilus influenzae NOT DETECTED NOT DETECTED Final   Neisseria meningitidis NOT DETECTED NOT DETECTED Final   Pseudomonas aeruginosa NOT DETECTED NOT DETECTED Final   Candida albicans NOT DETECTED NOT DETECTED Final   Candida glabrata NOT DETECTED NOT DETECTED Final   Candida krusei NOT DETECTED NOT DETECTED Final   Candida parapsilosis NOT DETECTED NOT DETECTED Final   Candida tropicalis NOT DETECTED NOT DETECTED Final    Comment: Performed at [facility], [address], [place], [place] [address]  Blood Culture (routine x 2)     Status: Abnormal   Collection Time: 04/15/19  9:08 PM   Specimen: BLOOD  Result Value Ref Range Status   Specimen Description BLOOD RIGHT WRIST  Final   Special Requests   Final    BOTTLES DRAWN AEROBIC AND ANAEROBIC Blood Culture results may not be optimal due to an inadequate volume of blood received in culture bottles   Culture  Setup Time   Final    GRAM POSITIVE COCCI IN BOTH AEROBIC AND ANAEROBIC BOTTLES CRITICAL VALUE NOTED.  VALUE IS CONSISTENT WITH PREVIOUSLY REPORTED AND CALLED VALUE.    Culture (A)  Final    ENTEROCOCCUS FAECALIS SUSCEPTIBILITIES PERFORMED ON PREVIOUS CULTURE WITHIN THE LAST 5 DAYS. Performed at [facility], [place] [address], [place], [place] [address]    Report Status 04/19/2019 FINAL  Final  Urine culture     Status: None   Collection Time: 04/15/19 11:11 PM   Specimen: In/Out Cath Urine  Result Value Ref Range Status   Specimen Description IN/OUT CATH URINE  Final   Special Requests NONE  Final   Culture   Final    NO GROWTH Performed at [facility], [place] [address], [place], [place] [address]    Report Status 04/17/2019 FINAL  Final  SARS Coronavirus 2 [facility] order, Performed in [facility] hospital lab)  Nasopharyngeal Nasopharyngeal Swab     Status: None   Collection Time: 04/15/19 11:52 PM   Specimen: Nasopharyngeal Swab  Result Value Ref Range Status   SARS Coronavirus 2 NEGATIVE NEGATIVE Final    Comment: (NOTE) If result is NEGATIVE SARS-CoV-2 target nucleic acids are NOT DETECTED. The SARS-CoV-2 RNA is generally detectable in upper and lower  respiratory specimens during the  acute phase of infection. The lowest  concentration of SARS-CoV-2 viral copies this assay can detect is 250  copies / mL. A negative result does not preclude SARS-CoV-2 infection  and should not be used as the sole basis for treatment or other  patient management decisions.  A negative result may occur with  improper specimen collection / handling, submission of specimen other  than nasopharyngeal swab, presence of viral mutation(s) within the  areas targeted by this assay, and inadequate number of viral copies  (<250 copies / mL). A negative result must be combined with clinical  observations, patient history, and epidemiological information. If result is POSITIVE SARS-CoV-2 target nucleic acids are DETECTED. The SARS-CoV-2 RNA is generally detectable in upper and lower  respiratory specimens dur ing the acute phase of infection.  Positive  results are indicative of active infection with SARS-CoV-2.  Clinical  correlation with patient history and other diagnostic information is  necessary to determine patient infection status.  Positive results do  not rule out bacterial infection or co-infection with other viruses. If result is PRESUMPTIVE POSTIVE SARS-CoV-2 nucleic acids MAY BE PRESENT.   A presumptive positive result was obtained on the submitted specimen  and confirmed on repeat testing.  While 2019 novel coronavirus  (SARS-CoV-2) nucleic acids may be present in the submitted sample  additional confirmatory testing may be necessary for epidemiological  and / or clinical management purposes  to  differentiate between  SARS-CoV-2 and other Sarbecovirus currently known to infect humans.  If clinically indicated additional testing with an alternate test  methodology [phone]) is advised. The SARS-CoV-2 RNA is generally  detectable in upper and lower respiratory sp ecimens during the acute  phase of infection. The expected result is Negative. Fact Sheet for Patients:  StrictlyIdeas.no Fact Sheet for Healthcare Providers: BankingDealers.co.za This test is not yet approved or cleared by the [place] FDA and has been authorized for detection and/or diagnosis of SARS-CoV-2 by FDA under an Emergency Use Authorization (EUA).  This EUA will remain in effect (meaning this test can be used) for the duration of the COVID-19 declaration under Section 564(b)(1) of the Act, 21 U.S.C. section 360bbb-3(b)(1), unless the authorization is terminated or revoked sooner. Performed at [facility], [place] [address], [place], [place] [address]   Culture, blood (routine x 2)     Status: None   Collection Time: 04/17/19  3:15 PM   Specimen: BLOOD  Result Value Ref Range Status   Specimen Description BLOOD RIGHT ANTECUBITAL  Final   Special Requests   Final    BOTTLES DRAWN AEROBIC AND ANAEROBIC Blood Culture results may not be optimal due to an excessive volume of blood received in culture bottles   Culture   Final    NO GROWTH 5 DAYS Performed at [facility], [place] [address] [place], [place] [address]    Report Status 04/22/2019 FINAL  Final  Culture, blood (routine x 2)     Status: None   Collection Time: 04/17/19  3:23 PM   Specimen: BLOOD  Result Value Ref Range Status   Specimen Description BLOOD BLOOD RIGHT FOREARM  Final   Special Requests   Final    BOTTLES DRAWN AEROBIC AND ANAEROBIC Blood Culture results may not be optimal due to an excessive volume of blood received in culture bottles   Culture   Final    NO GROWTH 5 DAYS  Performed at [facility], [place] [address] [place], [place]  [address]    Report Status 04/22/2019 FINAL  Final   OPAT ORDERS:  Diagnosis: mitral valve endocarditis  Culture Result: Enterococcus faecalis  Allergies  Allergen Reactions  . Amlodipine Swelling    Ankle swelling  . Atorvastatin Other (See Comments)    Myalgias   . Crestor [Rosuvastatin Calcium] Other (See Comments)    Stiffness and back pain  . Ezetimibe-Simvastatin Other (See Comments)    Leg cramps  . Statins Other (See Comments)    Myalgias  . Celebrex [Celecoxib] Rash  . Levemir [Insulin Detemir] Rash    Discharge antibiotics: Per pharmacy protocol - ampicillin 2 gm IV q 8 hrs Aim for Vancomycin trough 15-20 (unless otherwise indicated) Duration: 6 weeks in total End Date: 05/29/2019  [facility] Care and Maintenance Per Protocol _X_ Please pull PIC at completion of IV antibiotics __ Please leave PIC in place until doctor has seen patient or been notified  Labs weekly while on IV antibiotics: _X_ CBC with differential _X_ BMP __ BMP TWICE WEEKLY** __ CMP __ CRP __ ESR __ Vancomycin trough  Fax weekly labs to [phone]  Clinic Follow Up Appt: Thursday October 8th at 9:30 AM @ [facility] with Dr. Prince Rome  Impression/Plan: Patient is a 68 year old morbidly obese white female status post gastric bypass with stage III chronic kidney disease, h/o severe aortic stenosis, s/p TAVR and a chronic right heel ulcer admitted with malaise and subsequently found to have Enterococcal bacteremia.  1. Enterococcal sepsis/mitral valve endocarditis -both of the patient's initial blood cultures are now positive for Enterococcus faecalis.  She was empirically started on vancomycin, rocephin, and Flagyl.  She had a reported allergy to penicillin which caused nausea and potentially emesis when she received an IM penicillin injection many years ago.  As her enterococcus is ampicillin sensitive, she was given a test dose of  oral penicillin which she tolerated and then was transitioned to IV Unasyn instead. Will have pharmacy remove PCN as an allergy from the patient's chart.  Repeat blood cultures have been negative, thus establishing clearance of her bacteremia.  Given her history of a TAVR, TEE performed today which showed no evidence to endocarditis to her TAVR but did confirm a perivalvular leak. She did have a reported "degenrative mitral valve" with a possible mitral valve vegetation, however. Given the chronic nature of her right heel wound and poorly controlled diabetes, I am concerned that she is at rather high risk for subacute endocarditis, so will treat as such. Ideally, dual beta-lactam therapy would be entertained with rocephin/ampicillin, but given her pre-existing stage IV CRI and risk for ARF with dual beta-lactams, will convert to ampicillin 2 gm IV q 8 hrs as monotherapy for an anticipated 6 week duration instead. As her prosthetic valve/TAVR is not overtly infected, will avoid rifampin. Will plan to place tunnelled PICC tomorrow. Tentative ABX d/c date is 05/29/2019. > 20 minutes of time was spent on d/c planning alone today. See OPAT orders as above.  2. RT heel ulcer -the patient admits she has had a chronic ulcer to her right heel and ankle since February of this year.  It is unclear why she did not seek medical attention for this wound earlier although today's comments indicate she was asking her daughter who is a wound care nurse serve as the only faciltator of this care.  Based on conversations with the patient it appears to be multifactorial as the COVID pandemic made access to medical care more challenging, she appears to have an  element of peripheral neuropathy perhaps from DM and her gastric bypass history and subsequent vitamin deficiencies, and overall neglect/denial of her medical comorbidities and infectious risks.  Baseline CRP here is 10.2. Continue unasyn for now with plan to transition to IV  ampicillin at discharge as noted above. Fortunately, an MRI of her RT ankle showed no evidence of osteomyelitis but as she admits to frequent urinary incontinence over the past 6-7 months, urinary contamination of this wound would serve as the most likely route by which she became bacteremic. I have encouraged the patient to seek and remain in care at a local wound care center post-hospital d/c.  3. CRI - The patient's Cr baseline is unclear as she has ranged from 1.78 to 3.21 within the past 9 months, mostly with a trend of worsening in recent months. Her Cr today is 2.60, more consistent with stage IV renal disease. Difficult to say if this is true AoCRF, particularly as her DM has been mostly poorly controlled with ambulatory blood sugars in the mid 200s-low 300s in recent months per the patient.  Due to this issue, will plan for short PICC vs. tunneled catheter placement for the completion of her IV ABX as an OP. Appreciate nephrology assistance as the patient appears to be in denial re: the severity of her renal disease.

## 2019-04-24 NOTE — Progress Notes (Signed)
  Echocardiogram Echocardiogram Transesophageal has been performed.  [name] 04/24/2019, 11:48 AM

## 2019-04-24 NOTE — Interval H&P Note (Signed)
History and Physical Interval Note:  04/24/2019 10:52 AM  [name]  has presented today for surgery, with the diagnosis of endocarditis.  The various methods of treatment have been discussed with the patient and family. After consideration of risks, benefits and other options for treatment, the patient has consented to  Procedure(s): TRANSESOPHAGEAL ECHOCARDIOGRAM (TEE) (N/A) as a surgical intervention.  The patient's history has been reviewed, patient examined, no change in status, stable for surgery.  I have reviewed the patient's chart and labs.  Questions were answered to the patient's satisfaction.     [name]

## 2019-04-24 NOTE — Op Note (Addendum)
INDICATIONS: infective endocarditis  PROCEDURE:   Informed consent was obtained prior to the procedure. The risks, benefits and alternatives for the procedure were discussed and the patient comprehended these risks.  Risks include, but are not limited to, cough, sore throat, vomiting, nausea, somnolence, esophageal and stomach trauma or perforation, bleeding, low blood pressure, aspiration, pneumonia, infection, trauma to the teeth and death.    After a procedural time-out, the oropharynx was anesthetized with 20% benzocaine spray.   During this procedure the patient was administered IV propofol, Dr. Valma Cava - Anesthesiology. The transesophageal probe was inserted in the esophagus and stomach without difficulty and multiple views were obtained.  The patient was kept under observation until the patient left the procedure room.  The patient left the procedure room in stable condition.   Agitated microbubble saline contrast was not administered.  COMPLICATIONS:    There were no immediate complications.  FINDINGS:  Large, heavily calcified, slightly mobile segment of the posterior mitral annulus. More likely degenerative change, rather than vegetation.  There is no significant mitral insufficiency. TAVR prosthesis well seen. There are no vegetations seen. There are three jts of aortic insufficiency:  - Trivial intravalvular insufficiency.  - Trivial perivalvular leak is seen posteriorly, around the ostium of the left coronary artery.  - Mild and highly eccentric perivalvular leak is seen anteriorly around the ostium of the right coronary artery. Normal tricuspid and pulmonary valves. Normal LV function, EF >65%. LVH. Dilated LA. Mild aortic atherosclerosis (descending).  RECOMMENDATIONS:    No clear evidence of endocarditis, but the mobile mitral annulus calcification and the perivalvulat TAVR leak appear to be new when compared to the 2019 TEE and the February 2020 post-TAVR TTE,  respectively.  Time Spent Directly with the Patient:  45 minutes   [name] 04/24/2019, 11:31 AM

## 2019-04-25 ENCOUNTER — Inpatient Hospital Stay (HOSPITAL_COMMUNITY): Payer: Medicare Other

## 2019-04-25 ENCOUNTER — Encounter (HOSPITAL_COMMUNITY): Payer: Self-pay | Admitting: Interventional Radiology

## 2019-04-25 HISTORY — PX: IR FLUORO GUIDE CV LINE RIGHT: IMG2283

## 2019-04-25 HISTORY — PX: IR US GUIDE VASC ACCESS RIGHT: IMG2390

## 2019-04-25 LAB — BASIC METABOLIC PANEL
Anion gap: 12 (ref 5–15)
BUN: 82 mg/dL — ABNORMAL HIGH (ref 8–23)
CO2: 28 mmol/L (ref 22–32)
Calcium: 8.8 mg/dL — ABNORMAL LOW (ref 8.9–10.3)
Chloride: 100 mmol/L (ref 98–111)
Creatinine, Ser: 2.27 mg/dL — ABNORMAL HIGH (ref 0.44–1.00)
GFR calc Af Amer: 25 mL/min — ABNORMAL LOW (ref >60)
GFR calc non Af Amer: 21 mL/min — ABNORMAL LOW (ref >60)
Glucose, Bld: 134 mg/dL — ABNORMAL HIGH (ref 70–99)
Potassium: 3.6 mmol/L (ref 3.5–5.1)
Sodium: 140 mmol/L (ref 135–145)

## 2019-04-25 LAB — GLUCOSE, CAPILLARY
Glucose-Capillary: 110 mg/dL — ABNORMAL HIGH (ref 70–99)
Glucose-Capillary: 193 mg/dL — ABNORMAL HIGH (ref 70–99)
Glucose-Capillary: 93 mg/dL (ref 70–99)
Glucose-Capillary: 218 mg/dL — ABNORMAL HIGH (ref 70–99)

## 2019-04-25 LAB — PARATHYROID HORMONE, INTACT (NO CA): PTH: 27 pg/mL (ref 15–65)

## 2019-04-25 MED ORDER — SODIUM CHLORIDE 0.9 % IV SOLN
2.0000 g | Freq: Three times a day (TID) | INTRAVENOUS | Status: DC
  Administered 2019-04-25 – 2019-04-26 (×3): 2 g via INTRAVENOUS
  Filled 2019-04-25: qty 2000
  Filled 2019-04-25: qty 2
  Filled 2019-04-25: qty 2000
  Filled 2019-04-25 (×2): qty 2

## 2019-04-25 MED ORDER — LIDOCAINE HCL 1 % IJ SOLN
INTRAMUSCULAR | Status: DC | PRN
  Administered 2019-04-25 (×3): 10 mL

## 2019-04-25 MED ORDER — LIDOCAINE HCL 1 % IJ SOLN
INTRAMUSCULAR | Status: AC
  Filled 2019-04-25: qty 20

## 2019-04-25 NOTE — Consult Note (Signed)
Chief Complaint: Patient was seen in consultation today for tunneled central venous catheter placement Chief Complaint  Patient presents with   Weakness    Referring Physician(s): [place]  Supervising Physician: Corrie Mckusick  Patient Status: [facility] - In-pt  History of Present Illness: [name] is a 68 y.o. female with history of multiple medical problems including paroxysmal atrial fibrillation on eliquis, CAD, chronic kidney disease, obesity, GERD with esophageal stricture, HLD,HTN, DM, CHF, PAD, prior TAVR for sever aortic stenosis, prior gastric bypass who was recently admitted with weakness, rt foot ulcer and enterococcal bacteremia. Recent echo showed mobile mitral annulus calcification and perivalvulant TAVR leak.  Request received for tunneled CVC placement for long term antibiotic therapy.  Past Medical History:  Diagnosis Date   Atrial fibrillation ([place])    a. maintaining sinus s/p DCCV, on Eliquis   CAD (coronary artery disease)    a. Nonobstructive by cath 2006 - 25% LAD, 25-30% after 1st diag, distal 25% PDA, minor irreg LCx. b. Normal nuc 2011.   CKD (chronic kidney disease), stage III ([place])    Cystocele    Difficult intubation 09-07-2012   big neck trouble with intubation parotid surgery"takes little med to sedate"   Esophageal stricture    GERD (gastroesophageal reflux disease)    Hiatal hernia    a. 07/2013: HH with small stricture holding up barium tablet (concides with patient's sx of food sticking).   History of kidney stones    Hyperlipidemia    Hypertension    IBS (irritable bowel syndrome)    Memory difficulty 11/26/2015   Morbid obesity ([place])    a. 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   Pericardial effusion    a. Mod by echo 2012 at time of PNA. b. Echo 07/2012: small pericardial effusion vs fat.   Pressure ulcer 04/16/2019   right  foot   S/P TAVR (transcatheter aortic valve replacement)    Severe aortic stenosis    Sleep apnea     "have mask; don't use it" (02/14/2018)   TIA (transient ischemic attack) 10/2014; 06/2016   "some memory issues since; daughter says my speech is sometimes different" (02/14/2018)   Type II diabetes mellitus ([place])     Past Surgical History:  Procedure Laterality Date   BREATH TEK H PYLORI N/A 07/29/2013   Procedure: BREATH TEK H PYLORI;  Surgeon: Shann Medal, MD;  Location: [facility];  Service: General;  Laterality: N/A;   CARDIAC CATHETERIZATION  2006   CARDIOVERSION N/A 02/16/2018   Procedure: CARDIOVERSION;  Surgeon: Pixie Casino, MD;  Location: [facility] ENDOSCOPY;  Service: Cardiovascular;  Laterality: N/A;   CARPAL TUNNEL RELEASE Left 2011   CATARACT EXTRACTION W/ INTRAOCULAR LENS  IMPLANT, BILATERAL Bilateral 2003   left   CHOLECYSTECTOMY OPEN  1982   COLONOSCOPY N/A 07/14/2015   Procedure: COLONOSCOPY;  Surgeon: Ladene Artist, MD;  Location: [facility] ENDOSCOPY;  Service: Endoscopy;  Laterality: N/A;   ESOPHAGOGASTRODUODENOSCOPY N/A 12/27/2013   Procedure: ESOPHAGOGASTRODUODENOSCOPY (EGD);  Surgeon: Shann Medal, MD;  Location: [facility];  Service: General;  Laterality: N/A;   ESOPHAGOGASTRODUODENOSCOPY (EGD) WITH ESOPHAGEAL DILATION     INTRAOPERATIVE TRANSTHORACIC ECHOCARDIOGRAM  05/08/2018   Procedure: INTRAOPERATIVE TRANSTHORACIC ECHOCARDIOGRAM;  Surgeon: Burnell Blanks, MD;  Location: [place];  Service: Open Heart Surgery;;   LAPAROSCOPIC GASTRIC SLEEVE RESECTION N/A 01/20/2014   Procedure: LAPAROSCOPIC GASTRIC SLEEVE RESECTION;  Surgeon: Shann Medal, MD;  Location: [facility];  Service: General;  Laterality: N/A;   PAROTIDECTOMY  09/07/2012   Procedure: PAROTIDECTOMY;  Surgeon: Melida Quitter, MD;  Location: [place];  Service: ENT;  Laterality: Left;  LEFT PAROTIDECTOMY   RIGHT HEART CATH N/A 09/25/2018   Procedure: RIGHT HEART CATH;  Surgeon: Larey Dresser, MD;  Location: [place] CV LAB;  Service: Cardiovascular;  Laterality: N/A;   RIGHT/LEFT HEART CATH AND CORONARY ANGIOGRAPHY N/A 03/29/2018   Procedure: RIGHT/LEFT HEART CATH AND CORONARY ANGIOGRAPHY;  Surgeon: Burnell Blanks, MD;  Location: [place] CV LAB;  Service: Cardiovascular;  Laterality: N/A;   TEE WITHOUT CARDIOVERSION N/A 02/16/2018   Procedure: TRANSESOPHAGEAL ECHOCARDIOGRAM (TEE);  Surgeon: Pixie Casino, MD;  Location: [facility] ENDOSCOPY;  Service: Cardiovascular;  Laterality: N/A;   TOE AMPUTATION Left 07/2017   great toe; [facility]    TONSILLECTOMY  1968   TRANSCATHETER AORTIC VALVE REPLACEMENT, TRANSFEMORAL  05/08/2018   Transcatheter Aortic Valve Replacement    TRANSCATHETER AORTIC VALVE REPLACEMENT, TRANSFEMORAL N/A 05/08/2018   Procedure: TRANSCATHETER AORTIC VALVE REPLACEMENT, TRANSFEMORAL;  Surgeon: Burnell Blanks, MD;  Location: [place];  Service: Open Heart Surgery;  Laterality: N/A;   TUBAL LIGATION  yrs ago   UPPER GI ENDOSCOPY  01/20/2014   Procedure: UPPER GI ENDOSCOPY;  Surgeon: Shann Medal, MD;  Location: [facility];  Service: General;;    Allergies: Amlodipine, Atorvastatin, Crestor [rosuvastatin calcium], Ezetimibe-simvastatin, Statins, Celebrex [celecoxib], and Levemir [insulin detemir]  Medications: Prior to Admission medications   Medication Sig Start Date End Date Taking? Authorizing Provider  Alirocumab (PRALUENT) 75 MG/ML SOAJ Inject 75 mg into the skin every 14 (fourteen) days. 04/03/19  Yes Larey Dresser, MD  Calcium Carbonate-Vitamin D (CALCIUM 600+D) 600-400 MG-UNIT per tablet Take 1 tablet by mouth 2 (two) times daily. Patient taking differently: Take 1 tablet by mouth 3 (three) times a week.  05/06/15  Yes Cherre Robins, PharmD  carvedilol (COREG) 25 MG tablet TAKE 1 TABLET BY MOUTH TWICE A DAY Patient taking differently: Take 25 mg by mouth 2 (two) times daily with a meal.  04/02/19  Yes Larey Dresser, MD  Cholecalciferol  (VITAMIN D3) 50 MCG (2000 UT) TABS Take 2,000 Units by mouth daily.   Yes [provider]  ELIQUIS 5 MG TABS tablet TAKE 1 TABLET BY MOUTH TWICE A DAY Patient taking differently: Take 5 mg by mouth 2 (two) times daily.  02/27/19  Yes Minus Breeding, MD  fenofibrate (TRICOR) 48 MG tablet Take 1 tablet (48 mg total) by mouth daily. 03/14/19  Yes Larey Dresser, MD  hydrALAZINE (APRESOLINE) 10 MG tablet Take 1 tablet (10 mg total) by mouth every morning AND 2 tablets (20 mg total) daily with lunch AND 2 tablets (20 mg total) at bedtime. 03/14/19  Yes Larey Dresser, MD  insulin aspart protamine- aspart (NOVOLOG MIX 70/30) (70-30) 100 UNIT/ML injection Inject 60-75 Units into the skin See admin instructions. Inject 75 units in the morning and 60 units at lunch and 60 units at bedtime.   Yes [provider]  LIVALO 4 MG TABS TAKE 1 TABLET BY MOUTH EVERY DAY Patient taking differently: Take 4 mg by mouth daily.  03/22/19  Yes Gottschalk, Leatrice Jewels M, DO  meclizine (ANTIVERT) 12.5 MG tablet Take 12.5 mg by mouth 3 (three) times daily as needed for dizziness.   Yes [provider]  ondansetron (ZOFRAN ODT) 4 MG disintegrating tablet Take 1 tablet (4  mg total) by mouth every 8 (eight) hours as needed for nausea. 01/03/17  Yes Larene Pickett, PA-C  Prenatal Vit-Fe Fumarate-FA (PRENATAL MULTIVITAMIN) TABS tablet Take 1 tablet by mouth 3 (three) times a week.   Yes [provider]  Propylene Glycol (SYSTANE COMPLETE) 0.6 % SOLN Apply 1 drop to eye daily as needed (dry eyes).    Yes [provider]  torsemide (DEMADEX) 20 MG tablet Take 1 tablet (20 mg total) by mouth 2 (two) times daily. 03/14/19  Yes Larey Dresser, MD  vitamin E 400 UNIT capsule Take 400 Units by mouth 3 (three) times a week.    Yes [provider]  ACCU-CHEK AVIVA PLUS test strip USE TO CHECK BLOOD SUGAR UP TO TWICE DAILY OR AS INSTRUCTED 09/04/17   Chipper Herb, MD  B-D INS SYR ULTRAFINE  1CC/31G 31G X 5/16" 1 ML MISC USE TO INJECT INSULIN TWICE A DAY AS INSTRUCTED Patient taking differently: 1 Stick by Other route 3 (three) times daily before meals.  11/16/15   Chipper Herb, MD  Insulin Pen Needle 32G X 4 MM MISC Use to inject insulin with insulin pen 05/17/16   Chipper Herb, MD  Olmesartan-Amlodipine-HCTZ [facility]) 40-5-12.5 MG TABS Take 1 tablet by mouth daily.    04/12/18  [provider]     Family History  Problem Relation Age of Onset   Heart failure Mother    Hypertension Mother    Skin cancer Mother    Colitis Mother    Diabetes Mother    Kidney Stones Mother    Stroke Mother    Heart disease Father    Colon polyps Father    Diabetes Father    Heart failure Father    Heart attack Sister    Diabetes Other    Stroke Maternal Grandfather    Diabetes Maternal Grandfather    Colon cancer Neg Hx     Social History   Socioeconomic History   Marital status: Married    Spouse name: Not on file   Number of children: 3   Years of education: some college   Highest education level: Some college, no degree  Occupational History   Occupation: retired    Fish farm manager: [employer] resource strain: Not hard at all   Food insecurity    Worry: Never true    Inability: Never true   Transportation needs    Medical: No    Non-medical: No  Tobacco Use   Smoking status: Never Smoker   Smokeless tobacco: Never Used  Substance and Sexual Activity   Alcohol use: Yes    Alcohol/week: 0.0 standard drinks    Comment:  "mixed drink q few years"   Drug use: Never   Sexual activity: Yes    Birth control/protection: Post-menopausal  Lifestyle   Physical activity    Days per week: 0 days    Minutes per session: 0 min   Stress: Only a little  Relationships   Social connections    Talks on phone: More than three times a week    Gets together: More than three times a week    Attends religious  service: More than 4 times per year    Active member of club or organization: Yes    Attends meetings of clubs or organizations: More than 4 times per year    Relationship status: Married  Other Topics Concern   Not on file  Social  History Narrative   Married   Patient is right handed.   Patient rarely drinks caffeine.   Lives at home with husband in two story home. Husband washes laundry in the basement. They have 3 daughters and 7 grandchildren.       Review of Systems denies fever,HA,CP, worsening dyspea, cough, abd pain,N/V or bleeding; she does have back pain and loose stools  Vital Signs: BP (!) 126/49 (BP Location: Right Arm)    Pulse 64    Temp 98 F (36.7 C) (Oral)    Resp 16    Ht 5\' 7"  (1.702 m)    Wt 207 lb 0.2 oz (93.9 kg)    SpO2 98%    BMI 32.42 kg/m   Physical Exam awake/alert; chest- sl dim BS bases; heart- RRR; abd- soft,+BS,NT; no sig LE edema  Imaging: Ct Abdomen Pelvis Wo Contrast  Result Date: 04/15/2019 CLINICAL DATA:  Abdominal pain, fever, suspected abscess, nausea vomiting. EXAM: CT ABDOMEN AND PELVIS WITHOUT CONTRAST TECHNIQUE: Multidetector CT imaging of the abdomen and pelvis was performed following the standard protocol without IV contrast. COMPARISON:  CT renal colic Jan 03, 6313 FINDINGS: Lower chest: Bandlike areas of opacity in the lung bases may reflect a combination of atelectasis and/or scarring. There is prominent pericardial fat. There is a small pericardial effusion which is similar to comparison CT. Cardiac size is at the upper limits of normal. Transcatheter aortic valve replacement is noted. Dense mitral annular calcifications are seen. Atherosclerotic calcification of the coronary arteries. Hepatobiliary: No focal liver abnormality is seen. Patient is post cholecystectomy. Slight prominence of the biliary tree likely related to reservoir effect. No calcified intraductal gallstones. Pancreas: Unremarkable. No pancreatic ductal dilatation or  surrounding inflammatory changes. Spleen: Normal in size without focal abnormality. Small accessory splenule. Adrenals/Urinary Tract: Normal adrenal glands. The kidneys appear diminutive in size and atrophic. There is focal Peri in a lymph a may shin in the upper pole of the left kidney though this is overall similar to comparison CT dated Jan 03, 2017. No visible renal lesions. No urolithiasis or hydronephrosis. Normal appearance of the bladder. Stomach/Bowel: Distal esophagus is unremarkable there are postsurgical changes from prior gastric sleeve. Duodenal sweep is normal course without focal abnormality. No small bowel dilatation or wall thickening. A normal appendix is visualized. Scattered colonic diverticula without focal pericolonic inflammation to suggest diverticulitis. No abnormal colonic thickening or dilatation. Vascular/Lymphatic: Extensive atheromatous plaque throughout the aorta and branch vessels. No suspicious or enlarged lymph nodes in the included lymphatic chains. Reproductive: Normal appearance of the uterus and adnexal structures. Other: No abdominopelvic free fluid or free gas. No bowel containing hernias. Musculoskeletal: There is marked laxity of the anterior abdominal wall which is more pronounced than on comparison priors with atrophy of the rectus muscle sheath. No acute osseous abnormality or suspicious osseous lesion. 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 Multilevel degenerative changes are present in the imaged portions of the spine. Additional degenerative features in the pelvis and hips. IMPRESSION: 1. No acute intra-abdominal process. Specifically, no evidence of abdominopelvic abscess or infection. 2. Marked laxity of the anterior abdominal wall, more pronounced than on comparison priors. No bowel containing hernias. 3. Prior transcatheter aortic valve replacement. 4. Atrophic appearance of the kidneys,  correlate with renal function. 5. Stable pericardial effusion. 6. Prior gastric sleeve. 7. Aortic Atherosclerosis (ICD10-I70.0). Electronically Signed   By: Lovena Le M.D.   On: 04/15/2019 23:02   Ct Head Wo Contrast  Result Date: 04/15/2019 CLINICAL DATA:  Altered level of consciousness, nausea EXAM: CT HEAD WITHOUT CONTRAST TECHNIQUE: Contiguous axial images were obtained from the base of the skull through the vertex without intravenous contrast. COMPARISON:  MR, CT 06/16/2018 FINDINGS: Brain: No evidence of acute infarction, hemorrhage, hydrocephalus, extra-axial collection or mass lesion/mass effect. Symmetric prominence of the ventricles, cisterns and sulci compatible with parenchymal volume loss. Patchy areas of white matter hypoattenuation are most compatible with chronic microvascular angiopathy. Senescent mineralization of the basal ganglia bilaterally. Benign dural calcifications. Vascular: Atherosclerotic calcification of the carotid siphons and intradural vertebral arteries. No hyperdense vessel. Skull: Hyperostosis frontalis interna, a benign incidental finding. No calvarial fracture or suspicious osseous lesion. No scalp swelling or hematoma. Sinuses/Orbits: Paranasal sinuses and mastoid air cells are predominantly clear. Orbital structures are unremarkable aside from prior lens extractions. Other: None. IMPRESSION: No acute intracranial abnormality. Chronic microvascular angiopathy, volume loss and intracranial atherosclerosis. Electronically Signed   By: Lovena Le M.D.   On: 04/15/2019 23:05   Mr Heel Right Wo Contrast  Result Date: 04/21/2019 CLINICAL DATA:  Chronic heel ulcer, bacteremia EXAM: MR OF THE RIGHT HEEL WITHOUT CONTRAST TECHNIQUE: Multiplanar, multisequence MR imaging of the right hindfoot was performed. No intravenous contrast was administered. COMPARISON:  X-ray 04/15/2019 FINDINGS: Technical note: Examination significantly degraded by patient motion artifact despite  multiple attempts at repeat sequences. Best possible images were submitted for interpretation. Bones/Joint/Cartilage Focal area of mild bone marrow edema involving the lateral aspect of the posterior calcaneus at the Achilles tendon insertion (series 4, images 16-17). No confluent low T1 signal changes are appreciated in this area. No overlying soft tissue edema or fluid. Mild subchondral marrow signal changes at the lateral talar shoulder, likely degenerative. Overlying cartilage appears irregular, poorly evaluated. Marrow signal elsewhere within the hindfoot appears within normal limits. No tibiotalar or subtalar joint effusion. Ligaments Poorly evaluated, grossly intact. Muscles and Tendons Mild increased T2 signal within the intrinsic musculature at the visualized forefoot, likely nonspecific myositis. There is mild fatty infiltration of the musculature. Tendons are grossly intact without tenosynovitis. Soft tissues Edema within Kager's fat. Mild soft tissue edema overlies the lateral malleolus. No soft tissue fluid collections. IMPRESSION: 1. Limited exam. 2. Mild edema-like marrow signal changes within the posterolateral aspect of the calcaneus adjacent to the Achilles tendon insertion. Findings could be reactive or stress related. There is no associated confluent low T1 marrow signal or overlying soft tissue changes to suggest acute osteomyelitis at this site. 3. Mild intramuscular edema and fatty infiltration which may represent denervation changes versus myositis. Electronically Signed   By: Davina Poke M.D.   On: 04/21/2019 10:35   Dg Chest Port 1 View  Result Date: 04/15/2019 CLINICAL DATA:  Fever EXAM: PORTABLE CHEST 1 VIEW COMPARISON:  September 05, 2018 FINDINGS: The patient is status post prior TAVR. The heart size remains enlarged. Aortic calcifications are noted. There is no focal infiltrate. The previously noted left-sided airspace opacity has improved. There is no significant pleural  effusion. No significant pneumothorax. There may be some mild volume overload without overt pulmonary edema. IMPRESSION: No active disease. Electronically Signed   By: Constance Holster M.D.   On: 04/15/2019 21:22   Dg Foot Complete Right  Result Date: 04/15/2019 CLINICAL DATA:  He will pressure. EXAM: RIGHT FOOT COMPLETE - 3+ VIEW COMPARISON:  None. FINDINGS: There is no acute displaced fracture or dislocation. A moderate-sized plantar calcaneal spur is noted. Advanced vascular calcifications are noted. There is no radiopaque foreign  body. No significant surrounding soft tissue swelling. IMPRESSION: No acute osseous abnormality. Electronically Signed   By: Constance Holster M.D.   On: 04/15/2019 21:23    Labs:  CBC: Recent Labs    04/16/19 0554 04/17/19 0210 04/19/19 0639 04/23/19 0713  WBC 9.9 9.0 8.0 7.5  HGB 13.5 12.0 11.8* 12.0  HCT 41.6 36.5 35.8* 36.6  PLT 188 167 202 276    COAGS: Recent Labs    05/04/18 0934 05/08/18 0627  INR  --  1.00  APTT 23*  --     BMP: Recent Labs    04/22/19 0349 04/23/19 0713 04/24/19 0156 04/25/19 0228  NA 139 139 140 140  K 3.1* 3.5 3.5 3.6  CL 97* 98 99 100  CO2 28 27 28 28   GLUCOSE 199* 178* 184* 134*  BUN 93* 95* 101* 82*  CALCIUM 8.9 8.9 8.9 8.8*  CREATININE 2.67* 2.71* 2.60* 2.27*  GFRNONAA 18* 17* 18* 21*  GFRAA 20* 20* 21* 25*    LIVER FUNCTION TESTS: Recent Labs    06/15/18 1930 09/19/18 1440 04/15/19 2053 04/16/19 0554  BILITOT 0.7 0.7 0.7 0.9  AST 17 17 19 19   ALT 20 24 18 17   ALKPHOS 70 57 82 74  PROT 6.6 5.5* 6.6 6.1*  ALBUMIN 3.6 2.8* 3.3* 3.0*    TUMOR MARKERS: No results for input(s): AFPTM, CEA, CA199, CHROMGRNA in the last 8760 hours.  Assessment and Plan: 68 y.o. female with history of multiple medical problems including paroxysmal atrial fibrillation on eliquis, CAD, chronic kidney disease, obesity, GERD with esophageal stricture, HLD,HTN, DM, CHF, PAD, prior TAVR for sever aortic stenosis,  prior gastric bypass who was recently admitted with weakness, rt foot ulcer and enterococcal bacteremia. Recent echo showed mobile mitral annulus calcification and perivalvulant TAVR leak,? endocarditis. Request received for tunneled CVC placement for long term antibiotic therapy. Details/risks of procedure, incl but not limited to, internal bleeding, infection, injury to adjacent structures d/w pt with her understanding and consent.  Procedure tent planned for this afternoon.    Thank you for this interesting consult.  I greatly enjoyed meeting Mahaila P [place] and look forward to participating in their care.  A copy of this report was sent to the requesting provider on this date.  Electronically Signed: D. Rowe Robert, PA-C 04/25/2019, 10:55 AM   I spent a total of  25 minutes   in face to face in clinical consultation, greater than 50% of which was counseling/coordinating care for tunneled central venous catheter placement

## 2019-04-25 NOTE — Plan of Care (Signed)

## 2019-04-25 NOTE — Progress Notes (Signed)
Physical Therapy Treatment Patient Details Name: [name] MRN: [id number] DOB: 1951-03-03 Today's Date: 04/25/2019    History of Present Illness Pt is a 68 y/o female admitted secondary to generalized weakness, thought to be secondary to multifactoral cause. PMH includes obesity, CAD, CKD, DM, HTN, a fib, and s/p TAVR.     PT Comments    Pt continues to demonstrate good mobility progress. She ambulated 75 feet with RW min guard assist. LE exercises completed in sitting. Pt in recliner at end of session.   Follow Up Recommendations  Home health PT;Supervision/Assistance - 24 hour     Equipment Recommendations  None recommended by PT    Recommendations for Other Services       Precautions / Restrictions Precautions Precautions: Fall    Mobility  Bed Mobility Overal bed mobility: Modified Independent       Supine to sit: Modified independent (Device/Increase time) Sit to supine: Modified independent (Device/Increase time)   General bed mobility comments: +rail  Transfers Overall transfer level: Needs assistance Equipment used: Rolling walker (2 wheeled) Transfers: Sit to/from Stand Sit to Stand: Supervision Stand pivot transfers: Min guard          Ambulation/Gait Ambulation/Gait assistance: Min guard Gait Distance (Feet): 75 Feet Assistive device: Rolling walker (2 wheeled) Gait Pattern/deviations: Step-through pattern Gait velocity: decreased Gait velocity interpretation: <1.31 ft/sec, indicative of household ambulator General Gait Details: Pt declining hallway ambulation due to diarrhea.   Stairs             Wheelchair Mobility    Modified Rankin (Stroke Patients Only)       Balance Overall balance assessment: Needs assistance Sitting-balance support: No upper extremity supported;Feet supported Sitting balance-Leahy Scale: Good     Standing balance support: No upper extremity supported;During functional activity Standing  balance-Leahy Scale: Poor Standing balance comment: static standing without UE support and supervision. RW used for dynamic balance with ambulation, min guard.                            Cognition Arousal/Alertness: Awake/alert Behavior During Therapy: WFL for tasks assessed/performed Overall Cognitive Status: Within Functional Limits for tasks assessed                                        Exercises General Exercises - Lower Extremity Ankle Circles/Pumps: AROM;20 reps;Both;Seated Long Arc Quad: AROM;Right;Left;10 reps;Seated Hip Flexion/Marching: AROM;Right;Left;10 reps;Seated    General Comments        Pertinent Vitals/Pain Pain Assessment: No/denies pain    Home Living                      Prior Function            PT Goals (current goals can now be found in the care plan section) Acute Rehab PT Goals Patient Stated Goal: home PT Goal Formulation: With patient Time For Goal Achievement: 04/30/19 Potential to Achieve Goals: Good Progress towards PT goals: Progressing toward goals    Frequency    Min 3X/week      PT Plan Current plan remains appropriate    Co-evaluation              AM-PAC PT "6 Clicks" Mobility   Outcome Measure  Help needed turning from your back to your side while in a flat bed without using  bedrails?: None Help needed moving from lying on your back to sitting on the side of a flat bed without using bedrails?: A Little Help needed moving to and from a bed to a chair (including a wheelchair)?: A Little Help needed standing up from a chair using your arms (e.g., wheelchair or bedside chair)?: A Little Help needed to walk in hospital room?: A Little Help needed climbing 3-5 steps with a railing? : A Lot 6 Click Score: 18    End of Session Equipment Utilized During Treatment: Gait belt Activity Tolerance: Patient tolerated treatment well Patient left: in chair;with call bell/phone within  reach Nurse Communication: Mobility status PT Visit Diagnosis: Muscle weakness (generalized) (M62.81);Other abnormalities of gait and mobility (R26.89)     Time: 8938-1017 PT Time Calculation (min) (ACUTE ONLY): 15 min  Charges:  $Gait Training: 8-22 mins                     Lorrin Goodell, PT  Office # [phone] Pager [phone]    Lorriane Shire 04/25/2019, 9:57 AM

## 2019-04-25 NOTE — Progress Notes (Signed)
PROGRESS NOTE  [name] WUJ:[id number] DOB: 11-10-1950 DOA: 04/15/2019 PCP: Alison Norlander, DO  Brief History   [name] Lawsonis an 68 y.o.femalewith medical history significant ofatrial fibrillation, chronic kidney disease stage III, morbid obesity, GERD with esophageal stricture, hyperlipidemia, hypertension, IBS, osteoporosis who presented with significant weakness which is global. Patient said she has gradually felt weak unable to move around. She was on her bedside commode at home whenleaned forward on her bed and slipped to the floor. She could not get up from the floor.  On presentation she was found to have right foot diabetic ulcer which she noted since February 2020.  Positive blood cultures x2/2. -enterococcus.  Reported allergy to penicillin.   Right heel wound, MRI 04/21/19 right foot no sign of osteomyelitis.  TEE was performed on 04/24/2019. It has demonstrated an EF of 60-65%. Left atrium is mild to moderately dilated. TAVR valve ispresent in the aortic position with a perivalvular leak. There has been a change of the mitral annulus calcfication from 02/2018. Atypical endocarditis is a possible cause. Infectious disease has been consulted. Blood cultures are positive for enterococcus faecalis. However, ID feels that it is more likely that she has subacute endocarditis. She has been switch to ampicillin 2 gm IV q8hours. She will need 6 weeks of therapy. End date will be 05/29/2019.   Consultants  . Cound care . Interventional radiology . Nephrology . Infectious disease.  Procedures  . TEE  Antibiotics   Anti-infectives (From admission, onward)   Start     Dose/Rate Route Frequency Ordered Stop   04/25/19 1800  ampicillin (OMNIPEN) 2 g in sodium chloride 0.9 % 100 mL IVPB     2 g 300 mL/hr over 20 Minutes Intravenous Every 8 hours 04/25/19 1121     04/19/19 1230  vancomycin (VANCOCIN) IVPB 1000 mg/200 mL premix  Status:  Discontinued     1,000 mg 200  mL/hr over 60 Minutes Intravenous Every 48 hours 04/17/19 1203 04/17/19 2110   04/18/19 0600  vancomycin (VANCOCIN) 1,250 mg in sodium chloride 0.9 % 250 mL IVPB  Status:  Discontinued     1,250 mg 166.7 mL/hr over 90 Minutes Intravenous Every 48 hours 04/16/19 0753 04/16/19 0939   04/18/19 0600  vancomycin (VANCOCIN) IVPB 1000 mg/200 mL premix  Status:  Discontinued     1,000 mg 200 mL/hr over 60 Minutes Intravenous Every 48 hours 04/16/19 0939 04/16/19 1154   04/17/19 2300  Ampicillin-Sulbactam (UNASYN) 3 g in sodium chloride 0.9 % 100 mL IVPB  Status:  Discontinued     3 g 200 mL/hr over 30 Minutes Intravenous Every 12 hours 04/17/19 2151 04/25/19 1121   04/17/19 1600  amoxicillin (AMOXIL) capsule 500 mg     500 mg Oral  Once 04/17/19 1525 04/17/19 1745   04/17/19 1230  vancomycin (VANCOCIN) 2,000 mg in sodium chloride 0.9 % 500 mL IVPB     2,000 mg 250 mL/hr over 120 Minutes Intravenous  Once 04/17/19 1203 04/17/19 1544   04/16/19 2200  ceFEPIme (MAXIPIME) 2 g in sodium chloride 0.9 % 100 mL IVPB  Status:  Discontinued     2 g 200 mL/hr over 30 Minutes Intravenous Every 24 hours 04/16/19 0753 04/16/19 1154   04/16/19 1300  cefTRIAXone (ROCEPHIN) 2 g in sodium chloride 0.9 % 100 mL IVPB  Status:  Discontinued     2 g 200 mL/hr over 30 Minutes Intravenous Every 24 hours 04/16/19 1154 04/17/19 2110   04/16/19 1200  metroNIDAZOLE (FLAGYL) IVPB 500 mg  Status:  Discontinued     500 mg 100 mL/hr over 60 Minutes Intravenous Every 8 hours 04/16/19 1154 04/17/19 2110   04/16/19 0800  ciprofloxacin (CIPRO) tablet 500 mg  Status:  Discontinued     500 mg Oral 2 times daily 04/16/19 0726 04/16/19 0738   04/16/19 0000  ceFEPIme (MAXIPIME) 2 g in sodium chloride 0.9 % 100 mL IVPB     2 g 200 mL/hr over 30 Minutes Intravenous  Once 04/15/19 2354 04/16/19 0103   04/16/19 0000  vancomycin (VANCOCIN) 1,500 mg in sodium chloride 0.9 % 500 mL IVPB     1,500 mg 250 mL/hr over 120 Minutes Intravenous   Once 04/15/19 2354 04/16/19 0455    .  Marland Kitchen   Subjective  The patient is sitting up at bedside. No new complaints.  Objective   Vitals:  Vitals:   04/25/19 0534 04/25/19 1711  BP: (!) 126/49 (!) 144/55  Pulse: 64   Resp: 16   Temp: 98 F (36.7 C)   SpO2: 98%     Exam:  Constitutional:  . The patient is awake, alert, and oriented x 3. No acute distress. Respiratory:  . CTA bilaterally, no w/r/r. . Respiratory effort normal. No retractions or accessory muscle use Cardiovascular:  . RRR, no m/r/g . No LE extremity edema   . Normal pedal pulses Abdomen:  . Abdomen appears normal; no tenderness or masses . No hernias . No HSM Musculoskeletal:  . Digits/nails BUE: no clubbing, cyanosis, petechiae, infection . exam of joints, bones, muscles of at least one of following: head/neck, RUE, LUE, RLE, LLE   o strength and tone normal, no atrophy, no abnormal movements o No tenderness, masses o Normal ROM, no contractures  . gait and station Skin:  . No rashes, lesions, ulcers . palpation of skin: no induration or nodules Neurologic:  . CN 2-12 intact . Sensation all 4 extremities intact Psychiatric:  . Mental status o Mood, affect appropriate o Orientation to person, place, time  . judgment and insight appear intact    I have personally reviewed the following:   Today's Data  . Vitals, BMP  Micro Data  . Blood cultures.  Cardiology Data  . TEE .  Scheduled Meds: . apixaban  5 mg Oral BID  . calcium-vitamin D  1 tablet Oral 3 times weekly  . carvedilol  25 mg Oral BID  . cholecalciferol  2,000 Units Oral Daily  . fenofibrate  54 mg Oral Daily  . hydrALAZINE  10 mg Oral Q6H  . insulin aspart  0-20 Units Subcutaneous TID WC  . insulin aspart  0-5 Units Subcutaneous QHS  . insulin aspart  14 Units Subcutaneous TID WC  . insulin glargine  65 Units Subcutaneous BID  . lidocaine      . mupirocin ointment  1 application Topical BID  . nutrition supplement  (JUVEN)  1 packet Oral BID BM  . pravastatin  40 mg Oral q1800  . prenatal vitamin w/FE, FA  1 tablet Oral 3 times weekly  . Ensure Max Protein  11 oz Oral QHS  . vitamin E  400 Units Oral 3 times weekly   Continuous Infusions: . ampicillin (OMNIPEN) IV 2 g (04/25/19 1711)    Principal Problem:   Weakness generalized Active Problems:   Coronary artery disease, non-occlusive   CKD stage 3 due to type 2 diabetes mellitus (HCC)   Type II diabetes mellitus with peripheral circulatory disorder (  [place])   History of gastric bypass   Morbid obesity ([place])   Hypertension   Atrial fibrillation ([place])   S/P TAVR (transcatheter aortic valve replacement)   SIRS (systemic inflammatory response syndrome) (HCC)   Pressure injury of skin   Infected wound   Bacteremia due to Enterococcus   LOS: 9 days   A & P  Improving generalized weakness/lethargy likely in the setting of active infective process, enterococcus bacteremia and right heel diabetic ulcer Presented with significant weakness, found to have bacteremia and right heel diabetic ulcer Improving, PT OT with no further recommendation for therapy Continue to mobilize with assistance Fall precautions Vital signs reviewed and are stable.  Enterococcus bacteremia/subacute endocarditis. Heel ulcer as possible source. Positive blood cultures collected on 04/15/2019 from both aerobic and anaerobic bottles grew gram-positive cocci 2/2.- Enterococcus  Pansensitive to ampicillin, gentamicin, vancomycin Reported allergy to penicillin Penicillin challenge completed No acute issues with Unasyn  No sign of acute allergic reaction to penicillin Repeated blood culture done on 04/17/2019 negative to date, continue to follow. TEE completed with findings as stated above. Seen by infectious disease, following Awaiting infectious disease to decide course of treatment in light of TEE findings Currently on Unasyn Blood cultures x2 drawn on 04/16/2024 negative  final. Urine culture drawn on 04/15/2019 no growth, final.  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  Hypokalemia Potassium 3.1 Repleted with KCl 40 mEq once  Uncontrolled insulin-dependent type 2 diabetes with hyperglycemia Hemoglobin A1c 11.0 Diabetes coordinator following Increased NovoLog 14U  3 times daily with meals Increase Lantus 65 mg twice daily Continue resistant insulin sliding scale  Status post TAVR with possible leak From TEE findings 04/24/19 Continue IV antibiotics F/u With Dr. Aundra Dubin Defer management to cardiology  Right foot diabetic ulcer, POA in the setting of peripheral vascular disease. Wound care specialist consulted Appreciate recommendations  x-ray no acute osseous abnormalities Elevated CRP 10.2, low sed rate 17 on 04/16/2019 MRI right foot done on 04/21/2019 no evidence of osteomyelitis. Continue IV antibiotics Optimize pain control  Peripheral artery disease Poor dorsalis pedis pulses and diminished on right She states she has scheduled surgery on 05/02/2019 by vascular surgery  Paroxysmal A. Fib Rate controlled on Coreg Continue Eliquis for CVA prevention Continue to closely monitor on telemetry  Chronic diastolic CHF Continue strict I's and O's and daily weights  I have seen and examined this patient myself. I have spent 34 minutes in his evaluation and care.  DVT prophylaxis:eliquis Code Status:Full Code.  Family Communication: None at bedside. Disposition Plan: Patient is currently not appropriate for discharge due to enterococcus bacteremia requiring IV antibiotics, uncontrolled  insulin-dependent type 2 diabetes, worsening AKI on CKD 4, in the setting of multiple comorbidities and advanced age.  Plan to discharge to home when infectious disease and cardiology sign off.   Ruie Sendejo, DO [facility] Direct contact: see [email or web address]  7PM-7AM contact night coverage as above 04/25/2019, 6:48 PM  LOS: 9 days

## 2019-04-25 NOTE — TOC Progression Note (Signed)
Transition of Care [facility]) - Progression Note    Patient Details  Name: [name] MRN: [id number] Date of Birth: 1951-07-12  [facility] CM/SW Contact  Jacalyn Lefevre Edson Snowball, RN Phone Number: 04/25/2019, 4:26 PM  Clinical Narrative:     Received a text from [facility] with [facility] , they are now unable to take referral , they have sent to [facility]. Confirmed with Tommi Rumps with [name] and Pam with [facility]. Explained to patient and her husband. They voiced understanding.  Expected Discharge Plan: [place] Barriers to Discharge: Continued Medical Work up  Expected Discharge Plan and Services Expected Discharge Plan: [place] In-house Referral: Clinical Social Work Discharge Planning Services: CM Consult Post Acute Care Choice: [place] arrangements for the past 2 months: Single Family Home                 DME Arranged: N/A         HH Arranged: PT           Social Determinants of Health (SDOH) Interventions    Readmission Risk Interventions Readmission Risk Prevention Plan 04/25/2019 05/10/2018  Transportation Screening Complete Complete  PCP or Specialist Appt within 5-7 Days - Complete  PCP or Specialist Appt within 3-5 Days Patient refused -  Home Care Screening - Complete  Medication Review (RN CM) - Complete  HRI or Home Care Consult Complete -  Social Work Consult for [place] Planning/Counseling Complete -  Palliative Care Screening Not Applicable -  Medication Review Press photographer) Complete -  Some recent data might be hidden

## 2019-04-25 NOTE — TOC Progression Note (Signed)
Transition of Care [facility]) - Progression Note    Patient Details  Name: ZULY BELKIN MRN: [id number] Date of Birth: [birth year]  Transition of [facility]) CM/SW Contact  Jacalyn Lefevre Edson Snowball, RN Phone Number: 04/25/2019, 11:00 AM  Clinical Narrative:     Patient will need IV antibiotics at home. Patient aware her and her family will be taught how to administer ABX , [facility] will not be present everytime a dose is due. Patient has a daughter who is a Therapist, sports and 2 children who are RT.   [facility] will be infusion company. Called Mateo Flow with [facility] to provide update    Expected Discharge Plan: [place] Barriers to Discharge: Continued Medical Work up  Expected Discharge Plan and Services Expected Discharge Plan: [place] In-house Referral: Clinical Social Work Discharge Planning Services: CM Consult Post Acute Care Choice: [place] arrangements for the past 2 months: Single Family Home                 DME Arranged: N/A         HH Arranged: PT           Social Determinants of Health (SDOH) Interventions    Readmission Risk Interventions Readmission Risk Prevention Plan 05/10/2018  Transportation Screening Complete  PCP or Specialist Appt within 5-7 Days Complete  Home Care Screening Complete  Medication Review (RN CM) Complete  Some recent data might be hidden

## 2019-04-25 NOTE — Progress Notes (Signed)
Admit: 04/15/2019 LOS: 9  32F with CKD 3/4 and labile creatinine, unclear if has AKI or just fluctuation at the current time.  Admitted for enterococcal bacteremia in setting of TAVR, chronic right nonhealing wound with recent angioplasty.  Subjective:  . No interval events . Urine output partially recorded . Serum creatinine improved to 2.3, electrolyte stable   09/09 0701 - 09/10 0700 In: 436 [P.O.:236; I.V.:200] Out: 300 [Urine:300]  Filed Weights   04/16/19 0229 04/23/19 0500 04/25/19 0500  Weight: 93.5 kg 94 kg 93.9 kg    Scheduled Meds: . apixaban  5 mg Oral BID  . calcium-vitamin D  1 tablet Oral 3 times weekly  . carvedilol  25 mg Oral BID  . cholecalciferol  2,000 Units Oral Daily  . fenofibrate  54 mg Oral Daily  . hydrALAZINE  10 mg Oral Q6H  . insulin aspart  0-20 Units Subcutaneous TID WC  . insulin aspart  0-5 Units Subcutaneous QHS  . insulin aspart  14 Units Subcutaneous TID WC  . insulin glargine  65 Units Subcutaneous BID  . mupirocin ointment  1 application Topical BID  . nutrition supplement (JUVEN)  1 packet Oral BID BM  . pravastatin  40 mg Oral q1800  . prenatal vitamin w/FE, FA  1 tablet Oral 3 times weekly  . Ensure Max Protein  11 oz Oral QHS  . vitamin E  400 Units Oral 3 times weekly   Continuous Infusions: . ampicillin-sulbactam (UNASYN) IV 3 g (04/25/19 0937)   PRN Meds:.acetaminophen, meclizine, ondansetron **OR** ondansetron (ZOFRAN) IV, polyvinyl alcohol, sodium chloride  Current Labs: reviewed  Results for ADDISEN, CHAPPELLE (MRN [id number]) as of 04/24/2019 16:41  Ref. Range 04/23/2019 16:26  C3 Complement Latest Ref Range: 82 - 167 mg/dL 139  Complement C4, Body Fluid Latest Ref Range: 14 - 44 mg/dL 16    Physical Exam:  Blood pressure (!) 126/49, pulse 64, temperature 98 F (36.7 C), temperature source Oral, resp. rate 16, height 5\' 7"  (1.702 m), weight 93.9 kg, SpO2 98 %. GEN: Chronically ill-appearing, obese, lying flat in the bed,  NAD ENT: NCAT EYES: EOMI CV: Irregular, normal S1-S2 PULM: Coarse bilaterally, diminished in bases ABD: Soft, nontender, nondistended SKIN: No rashes or lesions EXT: No edema  A 1. CKD 3/4 with possible AKI, nonoliguric OR labile stable CKD; likely that any acute component is driven by #2.  Imaging negative for obstruction upon presentation.  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  Suspect baseline kidney disease driven by diabetic nephropathy and her other medical issues 2. Enterococcal faecalis bacteremia on Unasyn, RCID following, TEE 9/9, duration of antibiotics pending 3. Status post TAVR, diastolic congestive heart failure 4. A. fib, on NOAC 5. Morbid obesity status post gastric sleeve 6. DM2 7. Hypertension, blood pressure stable, ARB has been held 8. Hyperlipidemia on Praluent  P . Appears to have stable GFR, no further suggestions at the current time . Given medical complexity and baseline CKD, will need to continue care in our office, I will make an appointment. . Will sign off at the current time, please call with any questions or concerns . Medication Issues; o Preferred narcotic agents for pain control are hydromorphone, fentanyl, and methadone. Morphine should not be used.  o Baclofen should be avoided o Avoid oral sodium phosphate and magnesium citrate based laxatives / bowel preps    Pearson Grippe MD 04/25/2019, 10:52 AM  Recent Labs  Lab 04/23/19 0713 04/24/19 0156 04/25/19 0228  NA 139  140 140  K 3.5 3.5 3.6  CL 98 99 100  CO2 27 28 28   GLUCOSE 178* 184* 134*  BUN 95* 101* 82*  CREATININE 2.71* 2.60* 2.27*  CALCIUM 8.9 8.9 8.8*  PHOS 4.2  --   --    Recent Labs  Lab 04/19/19 0639 04/23/19 0713  WBC 8.0 7.5  HGB 11.8* 12.0  HCT 35.8* 36.6  MCV 84.0 82.6  PLT 202 276

## 2019-04-25 NOTE — Progress Notes (Signed)
PHARMACY CONSULT NOTE FOR:  OUTPATIENT  PARENTERAL ANTIBIOTIC THERAPY (OPAT)  Indication: Enterococcal bacteremia complicated by TAVR valve Regimen: Ampicillin 2 gm Q 8 hours End date: 05/29/2019  IV antibiotic discharge orders are pended. To discharging provider:  please sign these orders via discharge navigator,  Select New Orders & click on the button choice - Manage This Unsigned Work.     Thank you for allowing pharmacy to be a part of this patient's care.  Jimmy Footman, PharmD, BCPS, BCIDP Infectious Diseases Clinical Pharmacist Phone: [phone] 04/25/2019, 11:09 AM

## 2019-04-25 NOTE — Procedures (Signed)
Interventional Radiology Procedure Note  Procedure: Placement of a right IJ approach single lumen tunneled cuffed catheter.   Tip is positioned at the superior cavoatrial junction and catheter is ready for immediate use.  Complications: None Recommendations:  - Ok to use - Do not submerge - Routine line care   Signed,  Dulcy Fanny. Earleen Newport, DO

## 2019-04-26 LAB — CBC WITH DIFFERENTIAL/PLATELET
Abs Immature Granulocytes: 0.06 10*3/uL (ref 0.00–0.07)
Basophils Absolute: 0.1 10*3/uL (ref 0.0–0.1)
Basophils Relative: 1 %
Eosinophils Absolute: 0.3 10*3/uL (ref 0.0–0.5)
Eosinophils Relative: 5 %
HCT: 35.8 % — ABNORMAL LOW (ref 36.0–46.0)
Hemoglobin: 11.4 g/dL — ABNORMAL LOW (ref 12.0–15.0)
Immature Granulocytes: 1 %
Lymphocytes Relative: 37 %
Lymphs Abs: 2.5 10*3/uL (ref 0.7–4.0)
MCH: 27.1 pg (ref 26.0–34.0)
MCHC: 31.8 g/dL (ref 30.0–36.0)
MCV: 85.2 fL (ref 80.0–100.0)
Monocytes Absolute: 0.6 10*3/uL (ref 0.1–1.0)
Monocytes Relative: 10 %
Neutro Abs: 3.1 10*3/uL (ref 1.7–7.7)
Neutrophils Relative %: 46 %
Platelets: 295 10*3/uL (ref 150–400)
RBC: 4.2 MIL/uL (ref 3.87–5.11)
RDW: 13.4 % (ref 11.5–15.5)
WBC: 6.6 10*3/uL (ref 4.0–10.5)
nRBC: 0 % (ref 0.0–0.2)

## 2019-04-26 LAB — BASIC METABOLIC PANEL
Anion gap: 10 (ref 5–15)
BUN: 66 mg/dL — ABNORMAL HIGH (ref 8–23)
CO2: 27 mmol/L (ref 22–32)
Calcium: 8.9 mg/dL (ref 8.9–10.3)
Chloride: 104 mmol/L (ref 98–111)
Creatinine, Ser: 2.02 mg/dL — ABNORMAL HIGH (ref 0.44–1.00)
GFR calc Af Amer: 29 mL/min — ABNORMAL LOW (ref >60)
GFR calc non Af Amer: 25 mL/min — ABNORMAL LOW (ref >60)
Glucose, Bld: 194 mg/dL — ABNORMAL HIGH (ref 70–99)
Potassium: 3.7 mmol/L (ref 3.5–5.1)
Sodium: 141 mmol/L (ref 135–145)

## 2019-04-26 LAB — GLUCOSE, CAPILLARY
Glucose-Capillary: 158 mg/dL — ABNORMAL HIGH (ref 70–99)
Glucose-Capillary: 286 mg/dL — ABNORMAL HIGH (ref 70–99)

## 2019-04-26 MED ORDER — JUVEN PO PACK: 1.0000 | PACK | Freq: Two times a day (BID) | ORAL | 0 refills | Status: AC

## 2019-04-26 MED ORDER — HEPARIN SOD (PORK) LOCK FLUSH 100 UNIT/ML IV SOLN: 250.0000 [IU] | Freq: Once | INTRAVENOUS | 0 refills | Status: DC

## 2019-04-26 MED ORDER — HEPARIN SOD (PORK) LOCK FLUSH 100 UNIT/ML IV SOLN
250.0000 [IU] | INTRAVENOUS | Status: DC | PRN
  Administered 2019-04-26: 250 [IU]
  Filled 2019-04-26 (×2): qty 2.5

## 2019-04-26 MED ORDER — HYDRALAZINE HCL 10 MG PO TABS: 10.0000 mg | ORAL_TABLET | Freq: Four times a day (QID) | ORAL | 0 refills | Status: DC

## 2019-04-26 MED ORDER — AMPICILLIN IV (FOR PTA / DISCHARGE USE ONLY): 2.0000 g | Freq: Three times a day (TID) | INTRAVENOUS | 0 refills | Status: AC

## 2019-04-26 MED ORDER — HEPARIN SOD (PORK) LOCK FLUSH 100 UNIT/ML IV SOLN: 250.0000 [IU] | INTRAVENOUS | Status: DC | PRN

## 2019-04-26 MED ORDER — MUPIROCIN 2 % EX OINT: 1.0000 "application " | TOPICAL_OINTMENT | Freq: Two times a day (BID) | CUTANEOUS | 0 refills | Status: DC

## 2019-04-26 MED ORDER — ENSURE MAX PROTEIN PO LIQD: 11.0000 [oz_av] | Freq: Every day | ORAL | Status: DC

## 2019-04-26 MED ORDER — HEPARIN SOD (PORK) LOCK FLUSH 100 UNIT/ML IV SOLN: 250.0000 [IU] | Freq: Every day | INTRAVENOUS | Status: DC

## 2019-04-26 MED ORDER — HEPARIN SOD (PORK) LOCK FLUSH 100 UNIT/ML IV SOLN: 250.0000 [IU] | INTRAVENOUS | 0 refills | Status: DC | PRN

## 2019-04-26 NOTE — Plan of Care (Signed)
Pt for discharge today, husband at the bedside, with right IJ for home health antibiotic, given ampicillin this afternoon, alert and oriented, able to eat her meals well, OOB to chair, 1 assist to bedside commode, waiting for dressing to change ordered to materials, discontinue peripheral IV line, no complain of pain at this time.

## 2019-04-26 NOTE — Progress Notes (Signed)
PT Cancellation Note  Patient Details Name: [name] MRN: [id number] DOB: [birth year]   Cancelled Treatment:    Reason Eval/Treat Not Completed: Other (comment). Pt preparing to d/c home, declined additional PT treatment session. If to remain admitted, will follow-up.  [name], PT, DPT Acute Rehabilitation Services  Pager [phone] Office [phone]  Derry Lory 04/26/2019, 4:01 PM

## 2019-04-26 NOTE — Progress Notes (Deleted)
Pharmacy Antibiotic Note  [name] is a 68 y.o. female admitted on 04/15/2019 with weakness. Pt has history of TAVR. She was found to have a suspected foot infection for which she was treated with vanc, ceftriaxone and metronidazole. Later blood cultures positive for Enterococcus. Patient passed penicillin allergy challenge this admission, and pharmacy has been consulted to dose Unasyn for enterococcal bacteremia and wound infection.  WBC WNL, afebrile, SCr down to 2.02  9/6 MRI showed no evidence of osteomyelitis 9/9 TEE: no clear vegetations seen, three jts of aortic insufficiency  9/10: IJ line placed OPAT pended 9/10 - stop date 10/14  Plan: Continue Unasyn 3 gm IV Q 12 hrs Monitor CBC, renal function, clinical improvement, repeat blood cx   Height: 5\' 7"  (170.2 cm) Weight: 199 lb 8.3 oz (90.5 kg)(w/o the thick blanket from home) IBW/kg (Calculated) : 61.6  Temp (24hrs), Avg:98.3 F (36.8 C), Min:98.1 F (36.7 C), Max:98.4 F (36.9 C)  Recent Labs  Lab 04/22/19 0349 04/23/19 0713 04/24/19 0156 04/25/19 0228 04/26/19 0402  WBC  --  7.5  --   --  6.6  CREATININE 2.67* 2.71* 2.60* 2.27* 2.02*    Estimated Creatinine Clearance: 30.8 mL/min (A) (by C-G formula based on SCr of 2.02 mg/dL (H)).    Allergies  Allergen Reactions  . Amlodipine Swelling    Ankle swelling  . Atorvastatin Other (See Comments)    Myalgias   . Crestor [Rosuvastatin Calcium] Other (See Comments)    Stiffness and back pain  . Ezetimibe-Simvastatin Other (See Comments)    Leg cramps  . Statins Other (See Comments)    Myalgias  . Celebrex [Celecoxib] Rash  . Levemir [Insulin Detemir] Rash    Antimicrobials this admission: 9/1 cefepime>>9/1 9/1 vanc >>9/2 9/1 Rocephin 9/2 9/1 Flagyl>>9/2 9/2 Unasyn>   Microbiology results: 8/31 Bcx: Enterococcus 2/2, amp and vanc sensitive 8/31 Ucx: NG/final 9/2 Bcx X 2: ngf   Gwenlyn Fudge - Student PharmD  04/26/2019   9:09 AM

## 2019-04-26 NOTE — Progress Notes (Signed)
Pt discharged with right IJ flushed by IV team nurse.

## 2019-04-26 NOTE — Progress Notes (Signed)
Pt discharged home accompanied by his husband, alert and oriented, wound dressing done.

## 2019-04-26 NOTE — Consult Note (Signed)
   [facility] CM Inpatient Consult   04/26/2019  [name] 06-13-1951 [id number]    Follow-up note:  Following patient's disposition and needs for this Medicare/ [employer] A[employer] patient with 23% high risk score for unplanned readmission and hospitalization.  Patient's primarycareprovider Daiva Nakayama, DO with [facility], listed to provide transition of care follow-up.  Review oftransition of care team and PT notesshowthat patient will betransitioning tohome with home health services ([place]), with antibiotic home infusion (related to enterococcus bacteremia and right heel diabetic ulcer/ endocarditis).  Called and spoke with patient over the hospital phone. HIPAA verified. Patient reports that she is going home today, just waiting for her paperworks. Patient states she lives at home with husband. She denies any needs regarding medications (self managed with "pillbox"); pharmacy (using [facility]); transportation provided by her husband. Her family (husband and Catering manager, lives next door) provide assistance with her care needs at home.  Explained to patient regarding [facility] care management services available for managing/ maintaining health and preventing readmission. Patient is aware of [facility] services since she was engaged by [facility] (HF/DM) prior to admission and RN CM (EMMI follow-up calls) in the past. Verbal consent provided bypatientfor restart/ referral to [facility] RN care managementcoordinator for complex care and disease management of poorly controlled DM (A1c of 11) and to assess further needs post discharge. Patientverifiedher address and contact numberas listed in Epic.  Of note, [facility] Care Management services does not replace or interfere with any services that are arranged by inpatient transition of care case management or social work.  Transition of care CM outreached to make aware that [facility] CM  will be following post discharge.   For questionsand additional information,please contact:  Zechariah Bissonnette A. Lautaro Koral, BSN, RN-BC [facility] Liaison Cell: [phone]

## 2019-04-26 NOTE — Plan of Care (Signed)
  Problem: Activity: Goal: Risk for activity intolerance will decrease Outcome: Progressing   Problem: Nutrition: Goal: Adequate nutrition will be maintained Outcome: Progressing   

## 2019-04-26 NOTE — TOC Transition Note (Addendum)
Transition of Care [facility]) - CM/SW Discharge Note   Patient Details  Name: [name] MRN: [id number] Date of Birth: [birth year]  [facility] CM/SW Contact:  Alexander Mt, Vazquez Phone Number: 04/26/2019, 11:16 AM   Clinical Narrative:    1:54pm- slight delay with discharge, pt needs prior auth for abx; this is being completed by ID pharmacist; will discharge after this is complete.  11:16am-Pt set up with [facility]; her home infusion has been arranged and family will transport home. Pt has all equipment she needs and will be ready for dc when MD has completed papework.      Barriers to Discharge: Continued Medical Work up   Patient Goals and CMS Choice Patient states their goals for this hospitalization and ongoing recovery are:: to go home CMS Medicare.gov Compare Post Acute Care list provided to:: Patient Choice offered to / list presented to : Patient  Discharge Placement Patient to be transferred to facility by: home in personal car  Discharge Plan and Services In-house Referral: Clinical Social Work Discharge Planning Services: CM Consult Post Acute Care Choice: Home Health          DME Arranged: N/A HH Arranged: PT HH Agency: [facility] Date [place]: 04/25/19 Representative spoke with at [place]: [place] Interventions     Readmission Risk Interventions Readmission Risk Prevention Plan 04/25/2019 05/10/2018  Transportation Screening Complete Complete  PCP or Specialist Appt within 5-7 Days - Complete  PCP or Specialist Appt within 3-5 Days Patient refused -  Home Care Screening - Complete  Medication Review (RN CM) - Complete  HRI or Home Care Consult Complete -  Social Work Consult for [place] Planning/Counseling Complete -  Palliative Care Screening Not Applicable -  Medication Review Press photographer) Complete -  Some recent data might be hidden

## 2019-04-27 DIAGNOSIS — Z4800 Encounter for change or removal of nonsurgical wound dressing: Secondary | ICD-10-CM | POA: Diagnosis not present

## 2019-04-27 DIAGNOSIS — E1122 Type 2 diabetes mellitus with diabetic chronic kidney disease: Secondary | ICD-10-CM | POA: Diagnosis not present

## 2019-04-27 DIAGNOSIS — L97312 Non-pressure chronic ulcer of right ankle with fat layer exposed: Secondary | ICD-10-CM | POA: Diagnosis not present

## 2019-04-27 DIAGNOSIS — K5730 Diverticulosis of large intestine without perforation or abscess without bleeding: Secondary | ICD-10-CM | POA: Diagnosis not present

## 2019-04-27 DIAGNOSIS — I872 Venous insufficiency (chronic) (peripheral): Secondary | ICD-10-CM | POA: Diagnosis not present

## 2019-04-27 DIAGNOSIS — I700 Atherosclerosis of aorta: Secondary | ICD-10-CM | POA: Diagnosis not present

## 2019-04-27 DIAGNOSIS — M810 Age-related osteoporosis without current pathological fracture: Secondary | ICD-10-CM | POA: Diagnosis not present

## 2019-04-27 DIAGNOSIS — I4891 Unspecified atrial fibrillation: Secondary | ICD-10-CM | POA: Diagnosis not present

## 2019-04-27 DIAGNOSIS — A4181 Sepsis due to Enterococcus: Secondary | ICD-10-CM | POA: Diagnosis not present

## 2019-04-27 DIAGNOSIS — K449 Diaphragmatic hernia without obstruction or gangrene: Secondary | ICD-10-CM | POA: Diagnosis not present

## 2019-04-27 DIAGNOSIS — T8203XD Leakage of heart valve prosthesis, subsequent encounter: Secondary | ICD-10-CM | POA: Diagnosis not present

## 2019-04-27 DIAGNOSIS — N200 Calculus of kidney: Secondary | ICD-10-CM | POA: Diagnosis not present

## 2019-04-27 DIAGNOSIS — I058 Other rheumatic mitral valve diseases: Secondary | ICD-10-CM | POA: Diagnosis not present

## 2019-04-27 DIAGNOSIS — E1151 Type 2 diabetes mellitus with diabetic peripheral angiopathy without gangrene: Secondary | ICD-10-CM | POA: Diagnosis not present

## 2019-04-27 DIAGNOSIS — E1142 Type 2 diabetes mellitus with diabetic polyneuropathy: Secondary | ICD-10-CM | POA: Diagnosis not present

## 2019-04-27 DIAGNOSIS — M47819 Spondylosis without myelopathy or radiculopathy, site unspecified: Secondary | ICD-10-CM | POA: Diagnosis not present

## 2019-04-27 DIAGNOSIS — E1165 Type 2 diabetes mellitus with hyperglycemia: Secondary | ICD-10-CM | POA: Diagnosis not present

## 2019-04-27 DIAGNOSIS — I313 Pericardial effusion (noninflammatory): Secondary | ICD-10-CM | POA: Diagnosis not present

## 2019-04-27 DIAGNOSIS — I2510 Atherosclerotic heart disease of native coronary artery without angina pectoris: Secondary | ICD-10-CM | POA: Diagnosis not present

## 2019-04-27 DIAGNOSIS — K222 Esophageal obstruction: Secondary | ICD-10-CM | POA: Diagnosis not present

## 2019-04-27 DIAGNOSIS — N184 Chronic kidney disease, stage 4 (severe): Secondary | ICD-10-CM | POA: Diagnosis not present

## 2019-04-27 DIAGNOSIS — G4730 Sleep apnea, unspecified: Secondary | ICD-10-CM | POA: Diagnosis not present

## 2019-04-27 DIAGNOSIS — K589 Irritable bowel syndrome without diarrhea: Secondary | ICD-10-CM | POA: Diagnosis not present

## 2019-04-27 DIAGNOSIS — I129 Hypertensive chronic kidney disease with stage 1 through stage 4 chronic kidney disease, or unspecified chronic kidney disease: Secondary | ICD-10-CM | POA: Diagnosis not present

## 2019-04-27 NOTE — Discharge Summary (Addendum)
Physician Discharge Summary  [name] YSA:[id number] DOB: 1951-06-21 DOA: 04/15/2019  PCP: Alison Norlander, DO  Admit date: 04/15/2019 Discharge date: 04/27/2019  Recommendations for Outpatient Follow-up:  Home health for IV antibiotic administration and teaching. Last dose of IV antibiotic is 05/29/2019. Follow up with PCP in 7-10 days. Follow up with Nephrology as directed. Follow up with outpatient wound clinic for right foot wound.  Follow-up Information     Alison Agent, MD Follow up on 05/14/2019.   Specialty: Nephrology Why: [name] at 9:45am Contact information: [place] [phone]         Alison Ores, MD Follow up on 05/23/2019.   Specialty: Infectious Diseases Why: Follow up with Dr. Prince [name] in [facility] on Thursday October 8th at 9:30 AM for treatment of your endocarditis. Please arrive to clinic 15 minutes ahead of your appointment time to complete new patient paperwork. Contact information: [place] [address] [phone]         Care, [facility] Follow up.   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 pressure injury of left toe, stage 2, POA  Discharge Condition: Fair Disposition: Home with home health  Diet recommendation: Heart healthy with carbohydrate control  Filed Weights   04/23/19 0500 04/25/19 0500 04/26/19 0500  Weight: 94 kg 93.9 kg 90.5 kg    History of present illness:  [name] is a 68 y.o. female with medical history significant of atrial fibrillation, chronic kidney disease  stage III, morbid obesity, GERD with esophageal stricture, hyperlipidemia, hypertension, IBS, osteoporosis who presented with significant weakness which is global.  Patient said she has gradually felt weak unable to move around.  She was on her bedside commode at home when she slipped off and fell.  She could not get up from the floor.  She called her daughter who now called EMS.  Patient was seen by EMS patient was slightly altered.  She denied any fever or chills denied any exposure to cold or any sick contact.  When she arrived the ER temperature was 100.4.  Patient denied any abdominal pain or diarrhea no other source of her low-grade temperature.  She was globally noted to be weak however.  She has chronic right heel wound which is chronic and then stable.  No obvious drainage and no obvious infection.  Patient is being admitted for observation and management of her weakness.   ED Course: Temperature is 100.4 blood pressure 188/84 pulse 99 respirate of 24 and oxygen sat 87% room air.  Sodium 137 potassium 3.7 chloride 95 CO2 27 glucose 249 BUN is 38 and creatinine 2.33 calcium 8.8 gap of 15.  CBC is within normal.   CT abdomen pelvis showed No acute abdominal process.Head CT without contrast is negative.    Hospital Course:  [name] is an 68 y.o. female with medical history significant of atrial fibrillation, chronic kidney disease stage III, morbid obesity, GERD with esophageal stricture, hyperlipidemia, hypertension, IBS, osteoporosis who presented with significant weakness which is global.  Patient said she has gradually felt weak unable to move around.  She was  on her bedside commode at home when leaned forward on her bed and slipped to the floor.  She could not get up from the floor.   On presentation she was found to have right foot diabetic ulcer which she noted since February 2020.   Positive blood cultures x2/2. -enterococcus.  Reported allergy to penicillin.    Right heel wound, MRI  04/21/19 right foot no sign of osteomyelitis.   TEE was performed on 04/24/2019. It has demonstrated an EF of 60-65%. Left atrium is mild to moderately dilated. TAVR valve ispresent in the aortic position with a perivalvular leak. There has been a change of the mitral annulus calcfication from 02/2018. Atypical endocarditis is a possible cause. Infectious disease has been consulted. Blood cultures are positive for enterococcus faecalis. However, ID feels that it is more likely that she has subacute endocarditis. She has been switch to ampicillin 2 gm IV q8hours. She will need 6 weeks of therapy. End date will be 05/29/2019.   Today's assessment: S: The patient is resting comfortably. No new complaints. O: Vitals:  Vitals:   04/26/19 0434 04/26/19 1715  BP: (!) 129/44 (!) 146/66  Pulse: 68 71  Resp: 17   Temp: 98.4 F (36.9 C) 98.6 F (37 C)  SpO2: 94% 94%   Constitutional:  The patient is awake, alert, and oriented x 3. No acute distress. Respiratory:  CTA bilaterally, no w/r/r. Respiratory effort normal. No retractions or accessory muscle use Cardiovascular:  RRR, no m/r/g No LE extremity edema   Normal pedal pulses Abdomen:  Abdomen appears normal; no tenderness or masses No hernias No HSM Musculoskeletal:  Digits/nails BUE: no clubbing, cyanosis, petechiae, infection exam of joints, bones, muscles of at least one of following: head/neck, RUE, LUE, RLE, LLE   strength and tone normal, no atrophy, no abnormal movements No tenderness, masses Normal ROM, no contractures  gait and station Skin:  No rashes, lesions, ulcers palpation of skin: no induration or nodules Neurologic:  CN 2-12 intact Sensation all 4 extremities intact Psychiatric:  Mental status Mood, affect appropriate Orientation to person, place, time  judgment and insight appear intact      Discharge Instructions  Discharge Instructions     AMB Referral to [facility]   Complete by: As directed      Ms [name] is an 68 y.o. female with medical history significant of atrial fibrillation, chronic kidney disease stage III, morbid obesity, GERD with esophageal stricture, hyperlipidemia, hypertension, IBS, osteoporosis,    who presented with significant weakness which is global; and a fall.  Admitted for Generalized weakness/ lethargy likely in the setting of active infective process, gram-positive bacteremia and right heel diabetic ulcer, Enterococcus bacteremia, Type 2 diabetes with hyperglycemia, A1c= 11)   Primary Care Provider is  Ronnie Doss, DO with [facility] (Embedded practice).  NOTE: Please send a consent form for patient to obtain written consent for [facility] services.   Reason for consult: Referral to [facility] RN care management coordinator for complex care and disease management of poorly controlled DM (current A1c=11.0) and to assess further needs post discharge.   Diagnoses of: Diabetes   Expected date of contact: 1-3 days (reserved for hospital discharges)   Activity as tolerated - No restrictions   Complete by: As directed    Call MD for:  difficulty breathing, headache or visual disturbances   Complete by: As directed    Call MD for:  extreme fatigue   Complete by: As  directed    Call MD for:  redness, tenderness, or signs of infection (pain, swelling, redness, odor or green/yellow discharge around incision site)   Complete by: As directed    Call MD for:  temperature >100.4   Complete by: As directed    Diet - low sodium heart healthy   Complete by: As directed    Carbohydtate controlled.   Discharge instructions   Complete by: As directed    Home health RN for IV antibiotics. Last date of therapy will be 05/29/2019. Follow up with outpatient wound clinic for right foot ulcer. Follow up with nephrology as directed. Follow up with PCP in 7-10 days.   Home infusion instructions [facility] May follow [place] Dosing Protocol;  May administer Cathflo as needed to maintain patency of vascular access device.; Flushing of vascular access device: per [facility] Protocol: 0.9% NaCl pre/post medica...   Complete by: As directed    Instructions: May follow [place] Dosing Protocol   Instructions: May administer Cathflo as needed to maintain patency of vascular access device.   Instructions: Flushing of vascular access device: per [facility] Protocol: 0.9% NaCl pre/post medication administration and prn patency; Heparin 100 u/ml, 60m for implanted ports and Heparin 10u/ml, 59mfor all other central venous catheters.   Instructions: May follow [facility] Anaphylaxis Protocol for First Dose Administration in the home: 0.9% NaCl at 25-50 ml/hr to maintain IV access for protocol meds. Epinephrine 0.3 ml IV/IM PRN and Benadryl 25-50 IV/IM PRN s/s of anaphylaxis.   Instructions: [facility]fusion Coordinator (RN) to assist per patient IV care needs in the home PRN.   Increase activity slowly   Complete by: As directed       Allergies as of 04/26/2019       Reactions   Amlodipine Swelling   Ankle swelling   Atorvastatin Other (See Comments)   Myalgias   Crestor [rosuvastatin Calcium] Other (See Comments)   Stiffness and back pain   Ezetimibe-simvastatin Other (See Comments)   Leg cramps   Statins Other (See Comments)   Myalgias   Celebrex [celecoxib] Rash   Levemir [insulin Detemir] Rash        Medication List     TAKE these medications    Accu-Chek Aviva Plus test strip Generic drug: glucose blood USE TO CHECK BLOOD SUGAR UP TO TWICE DAILY OR AS INSTRUCTED   ampicillin  IVPB Inject 2 g into the vein every 8 (eight) hours. Indication:  Enterococcal bacteremia complicated by TAVR  Last Day of Therapy:  05/29/2019 Labs - Once weekly:  CBC/D and BMP, Labs - Every other week:  ESR and CRP   B-D INS SYR ULTRAFINE 1CC/31G 31G X 5/16" 1 ML Misc Generic drug: Insulin Syringe-Needle U-100 USE TO INJECT INSULIN TWICE A DAY  AS INSTRUCTED What changed: See the new instructions.   Calcium Carbonate-Vitamin D 600-400 MG-UNIT tablet Commonly known as: Calcium 600+D Take 1 tablet by mouth 2 (two) times daily. What changed: when to take this   carvedilol 25 MG tablet Commonly known as: COREG TAKE 1 TABLET BY MOUTH TWICE A DAY What changed: when to take this   Eliquis 5 MG Tabs tablet Generic drug: apixaban TAKE 1 TABLET BY MOUTH TWICE A DAY What changed: how much to take   fenofibrate 48 MG tablet Commonly known as: TRICOR Take 1 tablet (48 mg total) by mouth daily.   heparin lock flush 100 UNIT/ML Soln injection 2.5 mLs (250 Units total) by Intracatheter  route once for 1 dose.   heparin lock flush 100 UNIT/ML Soln injection 2.5 mLs (250 Units total) by Intracatheter route as needed (line care).   hydrALAZINE 10 MG tablet Commonly known as: APRESOLINE Take 1 tablet (10 mg total) by mouth every 6 (six) hours. What changed: See the new instructions.   insulin aspart protamine- aspart (70-30) 100 UNIT/ML injection Commonly known as: NOVOLOG MIX 70/30 Inject 60-75 Units into the skin See admin instructions. Inject 75 units in the morning and 60 units at lunch and 60 units at bedtime.   Insulin Pen Needle 32G X 4 MM Misc Use to inject insulin with insulin pen   Livalo 4 MG Tabs Generic drug: Pitavastatin Calcium TAKE 1 TABLET BY MOUTH EVERY DAY What changed: how much to take   meclizine 12.5 MG tablet Commonly known as: ANTIVERT Take 12.5 mg by mouth 3 (three) times daily as needed for dizziness.   mupirocin ointment 2 % Commonly known as: Bactroban Apply 1 application topically 2 (two) times daily.   nutrition supplement (JUVEN) Pack Take 1 packet by mouth 2 (two) times daily between meals.   Ensure Max Protein Liqd Take 330 mLs (11 oz total) by mouth at bedtime.   ondansetron 4 MG disintegrating tablet Commonly known as: Zofran ODT Take 1 tablet (4 mg total) by mouth every 8 (eight)  hours as needed for nausea.   Praluent 75 MG/ML Soaj Generic drug: Alirocumab Inject 75 mg into the skin every 14 (fourteen) days.   prenatal multivitamin Tabs tablet Take 1 tablet by mouth 3 (three) times a week.   Systane Complete 0.6 % Soln Generic drug: Propylene Glycol Apply 1 drop to eye daily as needed (dry eyes).   torsemide 20 MG tablet Commonly known as: DEMADEX Take 1 tablet (20 mg total) by mouth 2 (two) times daily.   Vitamin D3 50 MCG (2000 UT) Tabs Take 2,000 Units by mouth daily.   vitamin E 400 UNIT capsule Take 400 Units by mouth 3 (three) times a week.               Home Infusion Instuctions  (From admission, onward)           Start     Ordered   04/26/19 0000  Home infusion instructions [facility] May follow [place] Dosing Protocol; May administer Cathflo as needed to maintain patency of vascular access device.; Flushing of vascular access device: per [facility] Protocol: 0.9% NaCl pre/post medica...    Question Answer Comment  Instructions May follow [place] Dosing Protocol   Instructions May administer Cathflo as needed to maintain patency of vascular access device.   Instructions Flushing of vascular access device: per [facility] Protocol: 0.9% NaCl pre/post medication administration and prn patency; Heparin 100 u/ml, 55m for implanted ports and Heparin 10u/ml, 561mfor all other central venous catheters.   Instructions May follow [facility] Anaphylaxis Protocol for First Dose Administration in the home: 0.9% NaCl at 25-50 ml/hr to maintain IV access for protocol meds. Epinephrine 0.3 ml IV/IM PRN and Benadryl 25-50 IV/IM PRN s/s of anaphylaxis.   Instructions [facility] Infusion Coordinator (RN) to assist per patient IV care needs in the home PRN.      04/26/19 1115           Allergies  Allergen Reactions   Amlodipine Swelling    Ankle swelling   Atorvastatin Other (See Comments)    Myalgias    Crestor [Rosuvastatin Calcium]  Other (See  Comments)    Stiffness and back pain   Ezetimibe-Simvastatin Other (See Comments)    Leg cramps   Statins Other (See Comments)    Myalgias   Celebrex [Celecoxib] Rash   Levemir [Insulin Detemir] Rash    The results of significant diagnostics from this hospitalization (including imaging, microbiology, ancillary and laboratory) are listed below for reference.    Significant Diagnostic Studies: Ct Abdomen Pelvis Wo Contrast  Result Date: 04/15/2019 CLINICAL DATA:  Abdominal pain, fever, suspected abscess, nausea vomiting. EXAM: CT ABDOMEN AND PELVIS WITHOUT CONTRAST TECHNIQUE: Multidetector CT imaging of the abdomen and pelvis was performed following the standard protocol without IV contrast. COMPARISON:  CT renal colic Jan 03, 4234 FINDINGS: Lower chest: Bandlike areas of opacity in the lung bases may reflect a combination of atelectasis and/or scarring. There is prominent pericardial fat. There is a small pericardial effusion which is similar to comparison CT. Cardiac size is at the upper limits of normal. Transcatheter aortic valve replacement is noted. Dense mitral annular calcifications are seen. Atherosclerotic calcification of the coronary arteries. Hepatobiliary: No focal liver abnormality is seen. Patient is post cholecystectomy. Slight prominence of the biliary tree likely related to reservoir effect. No calcified intraductal gallstones. Pancreas: Unremarkable. No pancreatic ductal dilatation or surrounding inflammatory changes. Spleen: Normal in size without focal abnormality. Small accessory splenule. Adrenals/Urinary Tract: Normal adrenal glands. The kidneys appear diminutive in size and atrophic. There is focal Peri in a lymph a may shin in the upper pole of the left kidney though this is overall similar to comparison CT dated Jan 03, 2017. No visible renal lesions. No urolithiasis or hydronephrosis. Normal appearance of the bladder. Stomach/Bowel: Distal esophagus is  unremarkable there are postsurgical changes from prior gastric sleeve. Duodenal sweep is normal course without focal abnormality. No small bowel dilatation or wall thickening. A normal appendix is visualized. Scattered colonic diverticula without focal pericolonic inflammation to suggest diverticulitis. No abnormal colonic thickening or dilatation. Vascular/Lymphatic: Extensive atheromatous plaque throughout the aorta and branch vessels. No suspicious or enlarged lymph nodes in the included lymphatic chains. Reproductive: Normal appearance of the uterus and adnexal structures. Other: No abdominopelvic free fluid or free gas. No bowel containing hernias. Musculoskeletal: There is marked laxity of the anterior abdominal wall which is more pronounced than on comparison priors with atrophy of the rectus muscle sheath. No acute osseous abnormality or suspicious osseous lesion. 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 Multilevel degenerative changes are present in the imaged portions of the spine. Additional degenerative features in the pelvis and hips. IMPRESSION: 1. No acute intra-abdominal process. Specifically, no evidence of abdominopelvic abscess or infection. 2. Marked laxity of the anterior abdominal wall, more pronounced than on comparison priors. No bowel containing hernias. 3. Prior transcatheter aortic valve replacement. 4. Atrophic appearance of the kidneys, correlate with renal function. 5. Stable pericardial effusion. 6. Prior gastric sleeve. 7. Aortic Atherosclerosis (ICD10-I70.0). Electronically Signed   By: Lovena Le M.D.   On: 04/15/2019 23:02   Ct Head Wo Contrast  Result Date: 04/15/2019 CLINICAL DATA:  Altered level of consciousness, nausea EXAM: CT HEAD WITHOUT CONTRAST TECHNIQUE: Contiguous axial images were obtained from the base of the skull through the vertex without intravenous contrast. COMPARISON:  MR, CT  06/16/2018 FINDINGS: Brain: No evidence of acute infarction, hemorrhage, hydrocephalus, extra-axial collection or mass lesion/mass effect. Symmetric prominence of the ventricles, cisterns and sulci compatible with parenchymal volume loss. Patchy areas of white matter hypoattenuation are most compatible  with chronic microvascular angiopathy. Senescent mineralization of the basal ganglia bilaterally. Benign dural calcifications. Vascular: Atherosclerotic calcification of the carotid siphons and intradural vertebral arteries. No hyperdense vessel. Skull: Hyperostosis frontalis interna, a benign incidental finding. No calvarial fracture or suspicious osseous lesion. No scalp swelling or hematoma. Sinuses/Orbits: Paranasal sinuses and mastoid air cells are predominantly clear. Orbital structures are unremarkable aside from prior lens extractions. Other: None. IMPRESSION: No acute intracranial abnormality. Chronic microvascular angiopathy, volume loss and intracranial atherosclerosis. Electronically Signed   By: Lovena Le M.D.   On: 04/15/2019 23:05   Mr Heel Right Wo Contrast  Result Date: 04/21/2019 CLINICAL DATA:  Chronic heel ulcer, bacteremia EXAM: MR OF THE RIGHT HEEL WITHOUT CONTRAST TECHNIQUE: Multiplanar, multisequence MR imaging of the right hindfoot was performed. No intravenous contrast was administered. COMPARISON:  X-ray 04/15/2019 FINDINGS: Technical note: Examination significantly degraded by patient motion artifact despite multiple attempts at repeat sequences. Best possible images were submitted for interpretation. Bones/Joint/Cartilage Focal area of mild bone marrow edema involving the lateral aspect of the posterior calcaneus at the Achilles tendon insertion (series 4, images 16-17). No confluent low T1 signal changes are appreciated in this area. No overlying soft tissue edema or fluid. Mild subchondral marrow signal changes at the lateral talar shoulder, likely degenerative. Overlying cartilage  appears irregular, poorly evaluated. Marrow signal elsewhere within the hindfoot appears within normal limits. No tibiotalar or subtalar joint effusion. Ligaments Poorly evaluated, grossly intact. Muscles and Tendons Mild increased T2 signal within the intrinsic musculature at the visualized forefoot, likely nonspecific myositis. There is mild fatty infiltration of the musculature. Tendons are grossly intact without tenosynovitis. Soft tissues Edema within Kager's fat. Mild soft tissue edema overlies the lateral malleolus. No soft tissue fluid collections. IMPRESSION: 1. Limited exam. 2. Mild edema-like marrow signal changes within the posterolateral aspect of the calcaneus adjacent to the Achilles tendon insertion. Findings could be reactive or stress related. There is no associated confluent low T1 marrow signal or overlying soft tissue changes to suggest acute osteomyelitis at this site. 3. Mild intramuscular edema and fatty infiltration which may represent denervation changes versus myositis. Electronically Signed   By: Davina Poke M.D.   On: 04/21/2019 10:35   Ir Fluoro Guide Cv Line Right  Result Date: 04/25/2019 INDICATION: 68 year old female referred for tunneled catheter placement. EXAM: IMAGE GUIDED PLACEMENT OF RIGHT IJ TUNNELED CUFFED CENTRAL VENOUS ACCESS MEDICATIONS: None ANESTHESIA/SEDATION: None FLUOROSCOPY TIME:  Fluoroscopy Time: 0 minutes 12 seconds (1 mGy). COMPLICATIONS: None PROCEDURE: After written informed consent was obtained, patient was placed in the supine position on angiographic table. Patency of the right internal jugular vein was confirmed with ultrasound with image documentation. Patient was prepped and draped in the usual sterile fashion including the right neck and right superior chest. Using ultrasound guidance, the skin and subcutaneous tissues overlying the right internal jugular vein were generously infiltrated with 1% lidocaine without epinephrine. Using ultrasound  guidance, the right internal jugular vein was punctured with a micropuncture needle, and an 018 wire was advanced into the right heart confirming venous access. A small stab incision was made with an 11 blade scalpel. Peel-away sheath was placed over the wire, and then the wire was removed, marking the wire for estimation of internal catheter length. The chest wall was then generously infiltrated with 1% lidocaine for local anesthesia along the tissue tract. Small stab incision was made with 11 blade scalpel, and then the catheter was back tunneled to the puncture site at the right internal  jugular vein. Catheter was pulled through the tract, with the catheter amputated at 21 cm. Single-lumen cuffed catheter. Catheter was advanced through the peel-away sheath, and the peel-away sheath was removed. Final image was stored. The catheter was anchored to the chest wall with 2 retention sutures, and Derma bond was used to seal the right internal jugular vein incision site and at the right chest wall. Patient tolerated the procedure well and remained hemodynamically stable throughout. No complications were encountered and no significant blood loss was encountered. IMPRESSION: Status post right IJ tunneled cuffed central venous catheter. Signed, Dulcy Fanny. Dellia Nims, RPVI [facility] Electronically Signed   By: Corrie Mckusick D.O.   On: 04/25/2019 15:55   Ir US Guide Vasc Access Right  Result Date: 04/25/2019 INDICATION: 68 year old female referred for tunneled catheter placement. EXAM: IMAGE GUIDED PLACEMENT OF RIGHT IJ TUNNELED CUFFED CENTRAL VENOUS ACCESS MEDICATIONS: None ANESTHESIA/SEDATION: None FLUOROSCOPY TIME:  Fluoroscopy Time: 0 minutes 12 seconds (1 mGy). COMPLICATIONS: None PROCEDURE: After written informed consent was obtained, patient was placed in the supine position on angiographic table. Patency of the right internal jugular vein was confirmed  with ultrasound with image documentation. Patient was prepped and draped in the usual sterile fashion including the right neck and right superior chest. Using ultrasound guidance, the skin and subcutaneous tissues overlying the right internal jugular vein were generously infiltrated with 1% lidocaine without epinephrine. Using ultrasound guidance, the right internal jugular vein was punctured with a micropuncture needle, and an 018 wire was advanced into the right heart confirming venous access. A small stab incision was made with an 11 blade scalpel. Peel-away sheath was placed over the wire, and then the wire was removed, marking the wire for estimation of internal catheter length. The chest wall was then generously infiltrated with 1% lidocaine for local anesthesia along the tissue tract. Small stab incision was made with 11 blade scalpel, and then the catheter was back tunneled to the puncture site at the right internal jugular vein. Catheter was pulled through the tract, with the catheter amputated at 21 cm. Single-lumen cuffed catheter. Catheter was advanced through the peel-away sheath, and the peel-away sheath was removed. Final image was stored. The catheter was anchored to the chest wall with 2 retention sutures, and Derma bond was used to seal the right internal jugular vein incision site and at the right chest wall. Patient tolerated the procedure well and remained hemodynamically stable throughout. No complications were encountered and no significant blood loss was encountered. IMPRESSION: Status post right IJ tunneled cuffed central venous catheter. Signed, Dulcy Fanny. Dellia Nims, RPVI [facility] Rad[facility] Electronically Signed   By: Corrie Mckusick D.O.   On: 04/25/2019 15:55   Dg Chest Port 1 View  Result Date: 04/15/2019 CLINICAL DATA:  Fever EXAM: PORTABLE CHEST 1 VIEW COMPARISON:  September 05, 2018 FINDINGS: The patient is status post prior TAVR. The  heart size remains enlarged. Aortic calcifications are noted. There is no focal infiltrate. The previously noted left-sided airspace opacity has improved. There is no significant pleural effusion. No significant pneumothorax. There may be some mild volume overload without overt pulmonary edema. IMPRESSION: No active disease. Electronically Signed   By: Constance Holster M.D.   On: 04/15/2019 21:22   Dg Foot Complete Right  Result Date: 04/15/2019 CLINICAL DATA:  He will pressure. EXAM: RIGHT FOOT COMPLETE - 3+ VIEW COMPARISON:  None. FINDINGS: There is no acute displaced fracture or  dislocation. A moderate-sized plantar calcaneal spur is noted. Advanced vascular calcifications are noted. There is no radiopaque foreign body. No significant surrounding soft tissue swelling. IMPRESSION: No acute osseous abnormality. Electronically Signed   By: Constance Holster M.D.   On: 04/15/2019 21:23    Microbiology: Recent Results (from the past 240 hour(s))  Culture, blood (routine x 2)     Status: None   Collection Time: 04/17/19  3:15 PM   Specimen: BLOOD  Result Value Ref Range Status   Specimen Description BLOOD RIGHT ANTECUBITAL  Final   Special Requests   Final    BOTTLES DRAWN AEROBIC AND ANAEROBIC Blood Culture results may not be optimal due to an excessive volume of blood received in culture bottles   Culture   Final    NO GROWTH 5 DAYS Performed at [facility], [place] [address] [place], [place] [address]    Report Status 04/22/2019 FINAL  Final  Culture, blood (routine x 2)     Status: None   Collection Time: 04/17/19  3:23 PM   Specimen: BLOOD  Result Value Ref Range Status   Specimen Description BLOOD BLOOD RIGHT FOREARM  Final   Special Requests   Final    BOTTLES DRAWN AEROBIC AND ANAEROBIC Blood Culture results may not be optimal due to an excessive volume of blood received in culture bottles   Culture   Final    NO GROWTH 5 DAYS Performed at [facility], [place]  [address] [place], [place] [address]    Report Status 04/22/2019 FINAL  Final     Labs: Basic Metabolic Panel: Recent Labs  Lab 04/22/19 0349 04/23/19 0713 04/24/19 0156 04/25/19 0228 04/26/19 0402  NA 139 139 140 140 141  K 3.1* 3.5 3.5 3.6 3.7  CL 97* 98 99 100 104  CO2 28 27 28 28 27   GLUCOSE 199* 178* 184* 134* 194*  BUN 93* 95* 101* 82* 66*  CREATININE 2.67* 2.71* 2.60* 2.27* 2.02*  CALCIUM 8.9 8.9 8.9 8.8* 8.9  MG  --  2.0  --   --   --   PHOS  --  4.2  --   --   --    Liver Function Tests: No results for input(s): AST, ALT, ALKPHOS, BILITOT, PROT, ALBUMIN in the last 168 hours. No results for input(s): LIPASE, AMYLASE in the last 168 hours. No results for input(s): AMMONIA in the last 168 hours. CBC: Recent Labs  Lab 04/23/19 0713 04/26/19 0402  WBC 7.5 6.6  NEUTROABS  --  3.1  HGB 12.0 11.4*  HCT 36.6 35.8*  MCV 82.6 85.2  PLT 276 295   Cardiac Enzymes: No results for input(s): CKTOTAL, CKMB, CKMBINDEX, TROPONINI in the last 168 hours. BNP: BNP (last 3 results) Recent Labs    05/04/18 0934 09/05/18 1048  BNP 141.8* 116.6*    ProBNP (last 3 results) No results for input(s): PROBNP in the last 8760 hours.  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  Signed:        Thadius Smisek, DO [facility]  04/27/2019, 7:51  AM ADDENDUM : Enterococcal sepsis is resolved. Treatment for enterococcal bacteremia is ongoing.

## 2019-04-29 ENCOUNTER — Other Ambulatory Visit: Payer: Self-pay

## 2019-04-29 MED ORDER — HYDRALAZINE HCL 10 MG PO TABS: 10.0000 mg | ORAL_TABLET | Freq: Four times a day (QID) | ORAL | 0 refills | Status: DC

## 2019-05-01 ENCOUNTER — Ambulatory Visit (INDEPENDENT_AMBULATORY_CARE_PROVIDER_SITE_OTHER): Payer: Medicare Other | Admitting: Family Medicine

## 2019-05-01 ENCOUNTER — Other Ambulatory Visit: Payer: Self-pay | Admitting: *Deleted

## 2019-05-01 ENCOUNTER — Telehealth (HOSPITAL_COMMUNITY): Payer: Self-pay | Admitting: *Deleted

## 2019-05-01 ENCOUNTER — Encounter: Payer: Self-pay | Admitting: Family Medicine

## 2019-05-01 VITALS — BP 173/85 | HR 68 | Temp 97.0°F | Resp 16

## 2019-05-01 DIAGNOSIS — N184 Chronic kidney disease, stage 4 (severe): Secondary | ICD-10-CM | POA: Diagnosis not present

## 2019-05-01 DIAGNOSIS — I330 Acute and subacute infective endocarditis: Secondary | ICD-10-CM | POA: Diagnosis not present

## 2019-05-01 DIAGNOSIS — Z09 Encounter for follow-up examination after completed treatment for conditions other than malignant neoplasm: Secondary | ICD-10-CM | POA: Diagnosis not present

## 2019-05-01 DIAGNOSIS — E876 Hypokalemia: Secondary | ICD-10-CM

## 2019-05-01 DIAGNOSIS — R7881 Bacteremia: Secondary | ICD-10-CM | POA: Diagnosis not present

## 2019-05-01 LAB — CULTURE, BLOOD (ROUTINE X 2)
Culture: NO GROWTH
Culture: NO GROWTH
Special Requests: ADEQUATE
Special Requests: ADEQUATE

## 2019-05-01 NOTE — Progress Notes (Signed)
Subjective: CC: Hospital discharge PCP: [name], DO [id number] [name] is a 68 y.o. female presenting to clinic today for:  1. Hospital discharge Patient was admitted to the hospital for endocarditis.  She had about a 2-week stay before being discharged on the 12th.  It was found that she had enterococcal bacteremia with subacute endocarditis and acute on chronic kidney injury.  MRI of the foot showed no evidence of osteomyelitis.  She was discharged home with home health for IV antibiotic administration.  She is supposed to follow-up with nephrology and wound care.  In date of IV antibiotics will be 05/29/2019.  She notes overall she has been feeling much better than previous.  She has a follow-up with Dr. Joelyn Oms at the end of the month to follow-up on her kidneys.  She does need to have labs done on Monday and would like to go ahead and get them done now if possible.  These of been ordered through lab core.  She does report that she is feeling weaker than normal but is working closely with home health physical therapy to strengthen.  She also has wound care on board to follow her foot wound which thankfully did not show osteomyelitis.  She brings her sugar log today which does show downtrending and normalization of her blood sugars.  Currently she is taking her medications as directed and is planning on getting the Ensure supplement that was recommended at discharge from the hospital.  ROS: Per HPI  Allergies  Allergen Reactions  . Amlodipine Swelling    Ankle swelling  . Atorvastatin Other (See Comments)    Myalgias   . Crestor [Rosuvastatin Calcium] Other (See Comments)    Stiffness and back pain  . Ezetimibe-Simvastatin Other (See Comments)    Leg cramps  . Statins Other (See Comments)    Myalgias  . Celebrex [Celecoxib] Rash  . Levemir [Insulin Detemir] Rash   Past Medical History:  Diagnosis Date  . Atrial fibrillation ([place])    a. maintaining sinus s/Alison  DCCV, on Eliquis  . CAD (coronary artery disease)    a. Nonobstructive by cath 2006 - 25% LAD, 25-30% after 1st diag, distal 25% PDA, minor irreg LCx. b. Normal nuc 2011.  . CKD (chronic kidney disease), stage III ([place])   . Cystocele   . Difficult intubation 09-07-2012   big neck trouble with intubation parotid surgery"takes little med to sedate"  . Esophageal stricture   . GERD (gastroesophageal reflux disease)   . Hiatal hernia    a. 07/2013: HH with small stricture holding up barium tablet (concides with patient's sx of food sticking).  . History of kidney stones   . Hyperlipidemia   . Hypertension   . IBS (irritable bowel syndrome)   . Memory difficulty 11/26/2015  . Morbid obesity ([place])    a. hx of gastric bypass (sleeve gastrectomy 2015)  . Osteoporosis   . 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  [place] [facility] Pericardial effusion    a. Mod by echo 2012 at time of PNA. b. Echo 07/2012: small pericardial effusion vs fat.  . Pressure ulcer 04/16/2019   right foot  . S/Alison TAVR (transcatheter aortic valve replacement)   . Severe aortic stenosis   . Sleep apnea     "have mask; don't use it" (02/14/2018)  . TIA (transient ischemic attack) 10/2014; 06/2016   "some memory issues since; daughter says my speech is sometimes different" (02/14/2018)  . Type II  diabetes mellitus ([place])     Current Outpatient Medications:  .  ACCU-CHEK AVIVA PLUS test strip, USE TO CHECK BLOOD SUGAR UP TO TWICE DAILY OR AS INSTRUCTED, Disp: 100 each, Rfl: 2 .  Alirocumab (PRALUENT) 75 MG/ML SOAJ, Inject 75 mg into the skin every 14 (fourteen) days., Disp: 2 pen, Rfl: 6 .  ampicillin IVPB, Inject 2 g into the vein every 8 (eight) hours. Indication:  Enterococcal bacteremia complicated by TAVR  Last Day of Therapy:  05/29/2019 Labs - Once weekly:  CBC/D and BMP, Labs - Every other week:  ESR and CRP, Disp: 102 Units, Rfl: 0 .  B-D INS SYR ULTRAFINE 1CC/31G 31G X 5/16" 1 ML MISC, USE TO INJECT INSULIN TWICE  A DAY AS INSTRUCTED (Patient taking differently: 1 Stick by Other route 3 (three) times daily before meals. ), Disp: 100 each, Rfl: 2 .  Calcium Carbonate-Vitamin D (CALCIUM 600+D) 600-400 MG-UNIT per tablet, Take 1 tablet by mouth 2 (two) times daily. (Patient taking differently: Take 1 tablet by mouth 3 (three) times a week. ), Disp: , Rfl:  .  carvedilol (COREG) 25 MG tablet, TAKE 1 TABLET BY MOUTH TWICE A DAY (Patient taking differently: Take 25 mg by mouth 2 (two) times daily with a meal. ), Disp: 180 tablet, Rfl: 3 .  Cholecalciferol (VITAMIN D3) 50 MCG (2000 UT) TABS, Take 2,000 Units by mouth daily., Disp: , Rfl:  .  ELIQUIS 5 MG TABS tablet, TAKE 1 TABLET BY MOUTH TWICE A DAY (Patient taking differently: Take 5 mg by mouth 2 (two) times daily. ), Disp: 180 tablet, Rfl: 0 .  Ensure Max Protein (ENSURE MAX PROTEIN) LIQD, Take 330 mLs (11 oz total) by mouth at bedtime., Disp: 330 mL, Rfl: mL .  fenofibrate (TRICOR) 48 MG tablet, Take 1 tablet (48 mg total) by mouth daily., Disp: 90 tablet, Rfl: 3 .  heparin lock flush 100 UNIT/ML SOLN injection, 2.5 mLs (250 Units total) by Intracatheter route as needed (line care)., Disp: 2.5 mL, Rfl: 0 .  hydrALAZINE (APRESOLINE) 10 MG tablet, Take 1 tablet (10 mg total) by mouth every 6 (six) hours., Disp: 360 tablet, Rfl: 0 .  insulin aspart protamine- aspart (NOVOLOG MIX 70/30) (70-30) 100 UNIT/ML injection, Inject 60-75 Units into the skin See admin instructions. Inject 75 units in the morning and 60 units at lunch and 60 units at bedtime., Disp: , Rfl:  .  Insulin Pen Needle 32G X 4 MM MISC, Use to inject insulin with insulin pen, Disp: 100 each, Rfl: 4 .  LIVALO 4 MG TABS, TAKE 1 TABLET BY MOUTH EVERY DAY (Patient taking differently: Take 4 mg by mouth daily. ), Disp: 90 tablet, Rfl: 1 .  meclizine (ANTIVERT) 12.5 MG tablet, Take 12.5 mg by mouth 3 (three) times daily as needed for dizziness., Disp: , Rfl:  .  mupirocin ointment (BACTROBAN) 2 %, Apply 1  application topically 2 (two) times daily., Disp: 22 g, Rfl: 0 .  nutrition supplement, JUVEN, (JUVEN) PACK, Take 1 packet by mouth 2 (two) times daily between meals., Disp: 60 packet, Rfl: 0 .  ondansetron (ZOFRAN ODT) 4 MG disintegrating tablet, Take 1 tablet (4 mg total) by mouth every 8 (eight) hours as needed for nausea., Disp: 10 tablet, Rfl: 0 .  Prenatal Vit-Fe Fumarate-FA (PRENATAL MULTIVITAMIN) TABS tablet, Take 1 tablet by mouth 3 (three) times a week., Disp: , Rfl:  .  Propylene Glycol (SYSTANE COMPLETE) 0.6 % SOLN, Apply 1 drop to eye daily  as needed (dry eyes). , Disp: , Rfl:  .  torsemide (DEMADEX) 20 MG tablet, Take 1 tablet (20 mg total) by mouth 2 (two) times daily., Disp: 180 tablet, Rfl: 3 .  vitamin E 400 UNIT capsule, Take 400 Units by mouth 3 (three) times a week. , Disp: , Rfl:  .  heparin lock flush 100 UNIT/ML SOLN injection, 2.5 mLs (250 Units total) by Intracatheter route once for 1 dose., Disp: 2.5 mL, Rfl: 0 Social History   Socioeconomic History  . Marital status: Married    Spouse name: Not on file  . Number of children: 3  . Years of education: some college  . Highest education level: Some college, no degree  Occupational History  . Occupation: retired    Fish farm manager: [employer]  . Financial resource strain: Not hard at all  . Food insecurity    Worry: Never true    Inability: Never true  . Transportation needs    Medical: No    Non-medical: No  Tobacco Use  . Smoking status: Never Smoker  . Smokeless tobacco: Never Used  Substance and Sexual Activity  . Alcohol use: Yes    Alcohol/week: 0.0 standard drinks    Comment:  "mixed drink q few years"  . Drug use: Never  . Sexual activity: Yes    Birth control/protection: Post-menopausal  Lifestyle  . Physical activity    Days per week: 0 days    Minutes per session: 0 min  . Stress: Only a little  Relationships  . Social connections    Talks on phone: More than three times a week     Gets together: More than three times a week    Attends religious service: More than 4 times per year    Active member of club or organization: Yes    Attends meetings of clubs or organizations: More than 4 times per year    Relationship status: Married  . Intimate partner violence    Fear of current or ex partner: No    Emotionally abused: No    Physically abused: No    Forced sexual activity: No  Other Topics Concern  . Not on file  Social History Narrative   Married   Patient is right handed.   Patient rarely drinks caffeine.   Lives at home with husband in two story home. Husband washes laundry in the basement. They have 3 daughters and 7 grandchildren.    Family History  Problem Relation Age of Onset  . Heart failure Mother   . Hypertension Mother   . Skin cancer Mother   . Colitis Mother   . Diabetes Mother   . Kidney Stones Mother   . Stroke Mother   . Heart disease Father   . Colon polyps Father   . Diabetes Father   . Heart failure Father   . Heart attack Sister   . Diabetes Other   . Stroke Maternal Grandfather   . Diabetes Maternal Grandfather   . Colon cancer Neg Hx     Objective: Office vital signs reviewed. BP (!) 173/85   Pulse 68   Temp (!) 97 F (36.1 C) (Temporal)   Resp 16   SpO2 97%   Physical Examination:  General: Awake, alert, morbidly obese, No acute distress HEENT: Normal, cushingoid features Cardio: regular rate and rhythm, S1S2 heard, no murmurs appreciated Pulm: clear to auscultation bilaterally, no wheezes, rhonchi or rales; normal work of breathing on room air  Chest: right upper chest with port in place. Extremities: no edema. Poor muscle tone MSK: arrives in wheelchair  Assessment/ Plan: 68 y.o. female   1. Subacute bacterial endocarditis Patient has CMP, CBC and other labs ordered by nephrology.  Overall she seems to be doing well status post discharge from the hospital.  She will be continuing her antibiotics for another  month. - Basic Metabolic Panel - CBC  2. Hospital discharge follow-up She has scheduled follow-up visits with her specialists.  Second round of blood cultures have been negative to date and are final now.  3. Hypokalemia Recommend checking potassium level given hypokalemia noted in the hospital. - Basic Metabolic Panel   Orders Placed This Encounter  Procedures  . Basic Metabolic Panel  . CBC   No orders of the defined types were placed in this encounter.    [name], DO [facility] [phone]

## 2019-05-01 NOTE — Telephone Encounter (Signed)
[name] w/[employer] called to let us know that pt's enrollment form did not have ICD codes and they need them to move forward, filled in ICD codes and re-faxed enrollment forms

## 2019-05-02 NOTE — Patient Outreach (Signed)
Referred to [facility] but this member sees Dr Lajuana Ripple and they have an embedded care manager in their office.  Advised CMA that I have referred the pt to Chong Sicilian, RN at [facility].  Eulah Pont. Myrtie Neither, MSN, [facility] Gerontological Nurse Practitioner [facility] Care Management [phone]

## 2019-05-06 DIAGNOSIS — A4181 Sepsis due to Enterococcus: Secondary | ICD-10-CM | POA: Diagnosis not present

## 2019-05-07 ENCOUNTER — Telehealth (HOSPITAL_COMMUNITY): Payer: Self-pay | Admitting: *Deleted

## 2019-05-07 NOTE — Telephone Encounter (Signed)
Pt left VM stating she was recently hospitalized for two weeks and during that hospitalization she was not taking her injectable cholesterol medication. Pt was discharged on 9/11 and was sent home on iv antibiotics patient wants to know if she should restart her injectable cholesterol medication  Routed to [place] for advice

## 2019-05-07 NOTE — Telephone Encounter (Signed)
Yes please restart

## 2019-05-07 NOTE — Telephone Encounter (Signed)
Pt aware and agreeable with plan.  

## 2019-05-10 ENCOUNTER — Other Ambulatory Visit: Payer: Self-pay

## 2019-05-10 ENCOUNTER — Ambulatory Visit (INDEPENDENT_AMBULATORY_CARE_PROVIDER_SITE_OTHER): Payer: Medicare Other

## 2019-05-10 DIAGNOSIS — E1142 Type 2 diabetes mellitus with diabetic polyneuropathy: Secondary | ICD-10-CM

## 2019-05-10 DIAGNOSIS — R32 Unspecified urinary incontinence: Secondary | ICD-10-CM

## 2019-05-10 DIAGNOSIS — Z452 Encounter for adjustment and management of vascular access device: Secondary | ICD-10-CM

## 2019-05-10 DIAGNOSIS — N184 Chronic kidney disease, stage 4 (severe): Secondary | ICD-10-CM

## 2019-05-10 DIAGNOSIS — I700 Atherosclerosis of aorta: Secondary | ICD-10-CM

## 2019-05-10 DIAGNOSIS — Z4800 Encounter for change or removal of nonsurgical wound dressing: Secondary | ICD-10-CM

## 2019-05-10 DIAGNOSIS — K222 Esophageal obstruction: Secondary | ICD-10-CM

## 2019-05-10 DIAGNOSIS — E1122 Type 2 diabetes mellitus with diabetic chronic kidney disease: Secondary | ICD-10-CM | POA: Diagnosis not present

## 2019-05-10 DIAGNOSIS — I4891 Unspecified atrial fibrillation: Secondary | ICD-10-CM | POA: Diagnosis not present

## 2019-05-10 DIAGNOSIS — I872 Venous insufficiency (chronic) (peripheral): Secondary | ICD-10-CM

## 2019-05-10 DIAGNOSIS — T8203XD Leakage of heart valve prosthesis, subsequent encounter: Secondary | ICD-10-CM | POA: Diagnosis not present

## 2019-05-10 DIAGNOSIS — N200 Calculus of kidney: Secondary | ICD-10-CM

## 2019-05-10 DIAGNOSIS — L97312 Non-pressure chronic ulcer of right ankle with fat layer exposed: Secondary | ICD-10-CM

## 2019-05-10 DIAGNOSIS — K219 Gastro-esophageal reflux disease without esophagitis: Secondary | ICD-10-CM

## 2019-05-10 DIAGNOSIS — I058 Other rheumatic mitral valve diseases: Secondary | ICD-10-CM

## 2019-05-10 DIAGNOSIS — Z6831 Body mass index (BMI) 31.0-31.9, adult: Secondary | ICD-10-CM

## 2019-05-10 DIAGNOSIS — K5730 Diverticulosis of large intestine without perforation or abscess without bleeding: Secondary | ICD-10-CM

## 2019-05-10 DIAGNOSIS — E569 Vitamin deficiency, unspecified: Secondary | ICD-10-CM

## 2019-05-10 DIAGNOSIS — Z9861 Coronary angioplasty status: Secondary | ICD-10-CM

## 2019-05-10 DIAGNOSIS — K449 Diaphragmatic hernia without obstruction or gangrene: Secondary | ICD-10-CM

## 2019-05-10 DIAGNOSIS — I672 Cerebral atherosclerosis: Secondary | ICD-10-CM

## 2019-05-10 DIAGNOSIS — M852 Hyperostosis of skull: Secondary | ICD-10-CM

## 2019-05-10 DIAGNOSIS — Z8673 Personal history of transient ischemic attack (TIA), and cerebral infarction without residual deficits: Secondary | ICD-10-CM

## 2019-05-10 DIAGNOSIS — M810 Age-related osteoporosis without current pathological fracture: Secondary | ICD-10-CM

## 2019-05-10 DIAGNOSIS — I129 Hypertensive chronic kidney disease with stage 1 through stage 4 chronic kidney disease, or unspecified chronic kidney disease: Secondary | ICD-10-CM

## 2019-05-10 DIAGNOSIS — E1165 Type 2 diabetes mellitus with hyperglycemia: Secondary | ICD-10-CM | POA: Diagnosis not present

## 2019-05-10 DIAGNOSIS — Z792 Long term (current) use of antibiotics: Secondary | ICD-10-CM

## 2019-05-10 DIAGNOSIS — Z9841 Cataract extraction status, right eye: Secondary | ICD-10-CM

## 2019-05-10 DIAGNOSIS — E1151 Type 2 diabetes mellitus with diabetic peripheral angiopathy without gangrene: Secondary | ICD-10-CM

## 2019-05-10 DIAGNOSIS — Z7901 Long term (current) use of anticoagulants: Secondary | ICD-10-CM

## 2019-05-10 DIAGNOSIS — Z794 Long term (current) use of insulin: Secondary | ICD-10-CM

## 2019-05-10 DIAGNOSIS — G4730 Sleep apnea, unspecified: Secondary | ICD-10-CM

## 2019-05-10 DIAGNOSIS — M7731 Calcaneal spur, right foot: Secondary | ICD-10-CM

## 2019-05-10 DIAGNOSIS — E785 Hyperlipidemia, unspecified: Secondary | ICD-10-CM

## 2019-05-10 DIAGNOSIS — K589 Irritable bowel syndrome without diarrhea: Secondary | ICD-10-CM

## 2019-05-10 DIAGNOSIS — I313 Pericardial effusion (noninflammatory): Secondary | ICD-10-CM

## 2019-05-10 DIAGNOSIS — I2510 Atherosclerotic heart disease of native coronary artery without angina pectoris: Secondary | ICD-10-CM

## 2019-05-10 DIAGNOSIS — M47819 Spondylosis without myelopathy or radiculopathy, site unspecified: Secondary | ICD-10-CM

## 2019-05-10 DIAGNOSIS — A4181 Sepsis due to Enterococcus: Secondary | ICD-10-CM

## 2019-05-10 DIAGNOSIS — N8110 Cystocele, unspecified: Secondary | ICD-10-CM

## 2019-05-10 DIAGNOSIS — M4810 Ankylosing hyperostosis [Forestier], site unspecified: Secondary | ICD-10-CM

## 2019-05-13 ENCOUNTER — Ambulatory Visit: Payer: Medicare Other | Admitting: Cardiology

## 2019-05-13 DIAGNOSIS — A4181 Sepsis due to Enterococcus: Secondary | ICD-10-CM | POA: Diagnosis not present

## 2019-05-14 DIAGNOSIS — B952 Enterococcus as the cause of diseases classified elsewhere: Secondary | ICD-10-CM | POA: Diagnosis not present

## 2019-05-14 DIAGNOSIS — R7881 Bacteremia: Secondary | ICD-10-CM | POA: Diagnosis not present

## 2019-05-14 DIAGNOSIS — E1122 Type 2 diabetes mellitus with diabetic chronic kidney disease: Secondary | ICD-10-CM | POA: Diagnosis not present

## 2019-05-14 DIAGNOSIS — I129 Hypertensive chronic kidney disease with stage 1 through stage 4 chronic kidney disease, or unspecified chronic kidney disease: Secondary | ICD-10-CM | POA: Diagnosis not present

## 2019-05-14 DIAGNOSIS — N184 Chronic kidney disease, stage 4 (severe): Secondary | ICD-10-CM | POA: Diagnosis not present

## 2019-05-14 DIAGNOSIS — I5030 Unspecified diastolic (congestive) heart failure: Secondary | ICD-10-CM | POA: Diagnosis not present

## 2019-05-15 ENCOUNTER — Telehealth: Payer: Self-pay | Admitting: Cardiovascular Disease

## 2019-05-15 NOTE — Telephone Encounter (Signed)
Pt had recent TEE.  I spoke with her and told her she did not need an echocardiogram at this time. She is aware to follow up with Dr [name] as planned.

## 2019-05-15 NOTE — Telephone Encounter (Signed)
Patient wants to know if she still needs to have an echo done since her hospital stay

## 2019-05-16 ENCOUNTER — Telehealth (HOSPITAL_COMMUNITY): Payer: Self-pay | Admitting: Pharmacist

## 2019-05-16 ENCOUNTER — Telehealth: Payer: Self-pay | Admitting: *Deleted

## 2019-05-16 NOTE — Telephone Encounter (Signed)
Patient Advocate Encounter   Received notification from [employer] that prior authorization for Vyndamax (tafamidis) is required.   PA submitted directly to [employer] PA ID: [id number] Fax:[phone] ODGWZ:[phone] Status is pending   Will continue to follow.  Audry Riles, PharmD, BCPS, CPP [facility] Pharmacist [phone]

## 2019-05-16 NOTE — Telephone Encounter (Signed)
-----   Message from Minus Breeding, MD sent at 05/15/2019  9:41 PM EDT ----- Can she get in to see either me or APP.  Doesn't sound like it could be next week.   ----- Message ----- From: Carlyle Basques, MD Sent: 05/15/2019   4:42 PM EDT To: Minus Breeding, MD  Clearnce Sorrel, I am doing retrospective chart review on endocarditis patients and noticed that this patient who I think your patient on the ambulatory side. She was recently hospitalized for enterococcal MV endocarditis. She was also found to have small perivalvular leak involving her TAVR. I wasn't involved in her case, one of my new partners had seen her. I don't believe that cardiology or Dr Vernetta Honey saw her during her hospitalization. She is still on IV abtx. Any chance you can fit her into clinic and I will reach out to CT surgery to place on their radar if anything else needs to be done.  Caren Griffins

## 2019-05-17 NOTE — Telephone Encounter (Signed)
Follow Up  Patient is returning call from yesterday. Please give patient a call back.

## 2019-05-17 NOTE — Telephone Encounter (Signed)
Spoke with patient and scheduled visit for next week with Dr Percival Spanish

## 2019-05-20 DIAGNOSIS — A4181 Sepsis due to Enterococcus: Secondary | ICD-10-CM | POA: Diagnosis not present

## 2019-05-21 DIAGNOSIS — I330 Acute and subacute infective endocarditis: Secondary | ICD-10-CM | POA: Insufficient documentation

## 2019-05-21 NOTE — Progress Notes (Signed)
Cardiology Office Note   Date:  05/22/2019   ID:  Sheriece, Jefcoat 1951/06/25, MRN [id number]  PCP:  [name], DO  Cardiologist:   Minus Breeding, MD   Chief Complaint  Patient presents with  . Endocarditis      History of Present Illness: [name] is a 68 y.o. female who presents for follow up after hospitalization for endocarditis.   She had enterococcal bacteremia.  TEE demonstrated no obvious lesions on the TAVR.   There was a possible MV lesion.  She is being treated with six weeks of IV ampicillin.    I personally reviewed the images for this appt. she had a well-preserved ejection fraction.  There is no mention of pericardial effusion.  The mitral valve had moderate thickening with severe mitral annular calcification.  And what appeared to be the middle scallop of the posterior leaflet there was a 1.5 cm x 0.5 cm slightly mobile mass and vegetation cannot be excluded.  There is a bioprosthetic aortic valve with perivalvular leak that was mild.  This did not appear necessarily to be there previously although these images were somewhat suboptimal.  The patient states she has been feeling okay.  She has peripheral vascular disease and has an ulcer on her right foot which was thought possibly to be the nidus of infection and this is healing.  She has a couple of ulcers on her second and third toe on her left foot that are healed or healing.  She sees peripheral vascular specialist at [facility].  She gets around with a walker at home being limited by leg weakness and pain as well as having had an amputation of left great toe.  She denies any acute shortness of breath, PND or orthopnea.  She has had no palpitations, presyncope or syncope.  He has had some dizziness.  She has had no fevers or chills.  He is about to complete antibiotics.  He has not had any of the weekend abdominal distention she has of her acute on chronic diastolic heart failure.  She did have trauma to  her left leg earlier this year with a fracture that was managed conservatively.   Past Medical History:  Diagnosis Date  . Atrial fibrillation ([place])    a. maintaining sinus s/p DCCV, on Eliquis  . CAD (coronary artery disease)    a. Nonobstructive by cath 2006 - 25% LAD, 25-30% after 1st diag, distal 25% PDA, minor irreg LCx. b. Normal nuc 2011.  . CKD (chronic kidney disease), stage III   . Cystocele   . Difficult intubation 09-07-2012   big neck trouble with intubation parotid surgery"takes little med to sedate"  . Esophageal stricture   . GERD (gastroesophageal reflux disease)   . Hiatal hernia    a. 07/2013: HH with small stricture holding up barium tablet (concides with patient's sx of food sticking).  . History of kidney stones   . Hyperlipidemia   . Hypertension   . IBS (irritable bowel syndrome)   . Memory difficulty 11/26/2015  . Morbid obesity ([place])    a. hx of gastric bypass (sleeve gastrectomy 2015)  . Osteoporosis   . 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  Marland Kitchen Pericardial effusion    a. Mod by echo 2012 at time of PNA. b. Echo 07/2012: small pericardial effusion vs fat.  . Pressure ulcer 04/16/2019   right foot  . S/P TAVR (transcatheter aortic valve replacement)   .  Severe aortic stenosis   . Sleep apnea     "have mask; don't use it" (02/14/2018)  . TIA (transient ischemic attack) 10/2014; 06/2016   "some memory issues since; daughter says my speech is sometimes different" (02/14/2018)  . Type II diabetes mellitus ([place])     Past Surgical History:  Procedure Laterality Date  . BREATH TEK H PYLORI N/A 07/29/2013   Procedure: [place];  Surgeon: Shann Medal, MD;  Location: [facility];  Service: General;  Laterality: N/A;  . CARDIAC CATHETERIZATION  2006  . CARDIOVERSION N/A 02/16/2018   Procedure: CARDIOVERSION;  Surgeon: Pixie Casino, MD;  Location: [place];  Service: Cardiovascular;  Laterality: N/A;  . CARPAL TUNNEL  RELEASE Left 2011  . CATARACT EXTRACTION W/ INTRAOCULAR LENS  IMPLANT, BILATERAL Bilateral 2003   left  . CHOLECYSTECTOMY OPEN  1982  . COLONOSCOPY N/A 07/14/2015   Procedure: COLONOSCOPY;  Surgeon: Ladene Artist, MD;  Location: [facility];  Service: Endoscopy;  Laterality: N/A;  . ESOPHAGOGASTRODUODENOSCOPY N/A 12/27/2013   Procedure: ESOPHAGOGASTRODUODENOSCOPY (EGD);  Surgeon: Shann Medal, MD;  Location: [facility];  Service: General;  Laterality: N/A;  . ESOPHAGOGASTRODUODENOSCOPY (EGD) WITH ESOPHAGEAL DILATION    . INTRAOPERATIVE TRANSTHORACIC ECHOCARDIOGRAM  05/08/2018   Procedure: INTRAOPERATIVE TRANSTHORACIC ECHOCARDIOGRAM;  Surgeon: Burnell Blanks, MD;  Location: [place];  Service: Open Heart Surgery;;  . IR FLUORO GUIDE CV LINE RIGHT  04/25/2019  . IR US GUIDE VASC ACCESS RIGHT  04/25/2019  . LAPAROSCOPIC GASTRIC SLEEVE RESECTION N/A 01/20/2014   Procedure: LAPAROSCOPIC GASTRIC SLEEVE RESECTION;  Surgeon: Shann Medal, MD;  Location: [facility];  Service: General;  Laterality: N/A;  . PAROTIDECTOMY  09/07/2012   Procedure: PAROTIDECTOMY;  Surgeon: Melida Quitter, MD;  Location: [place];  Service: ENT;  Laterality: Left;  LEFT PAROTIDECTOMY  . RIGHT HEART CATH N/A 09/25/2018   Procedure: RIGHT HEART CATH;  Surgeon: Larey Dresser, MD;  Location: [place] [facility];  Service: Cardiovascular;  Laterality: N/A;  . RIGHT/LEFT HEART CATH AND CORONARY ANGIOGRAPHY N/A 03/29/2018   Procedure: RIGHT/LEFT HEART CATH AND CORONARY ANGIOGRAPHY;  Surgeon: Burnell Blanks, MD;  Location: [place] [facility];  Service: Cardiovascular;  Laterality: N/A;  . TEE WITHOUT CARDIOVERSION N/A 02/16/2018   Procedure: TRANSESOPHAGEAL ECHOCARDIOGRAM (TEE);  Surgeon: Pixie Casino, MD;  Location: [place];  Service: Cardiovascular;  Laterality: N/A;  . TEE WITHOUT CARDIOVERSION N/A 04/24/2019   Procedure: TRANSESOPHAGEAL ECHOCARDIOGRAM (TEE);  Surgeon: Sanda Klein, MD;  Location: [place];   Service: Cardiovascular;  Laterality: N/A;  . TOE AMPUTATION Left 07/2017   great toe; [facility]   . TONSILLECTOMY  1968  . TRANSCATHETER AORTIC VALVE REPLACEMENT, TRANSFEMORAL  05/08/2018   Transcatheter Aortic Valve Replacement   . TRANSCATHETER AORTIC VALVE REPLACEMENT, TRANSFEMORAL N/A 05/08/2018   Procedure: TRANSCATHETER AORTIC VALVE REPLACEMENT, TRANSFEMORAL;  Surgeon: Burnell Blanks, MD;  Location: [place];  Service: Open Heart Surgery;  Laterality: N/A;  . TUBAL LIGATION  yrs ago  . UPPER GI ENDOSCOPY  01/20/2014   Procedure: UPPER GI ENDOSCOPY;  Surgeon: Shann Medal, MD;  Location: [facility];  Service: General;;     Current Outpatient Medications  Medication Sig Dispense Refill  . ACCU-CHEK AVIVA PLUS test strip USE TO CHECK BLOOD SUGAR UP TO TWICE DAILY OR AS INSTRUCTED 100 each 2  . Alirocumab (PRALUENT) 75 MG/ML SOAJ Inject 75 mg into the skin every 14 (fourteen) days. 2 pen 6  . ampicillin IVPB  Inject 2 g into the vein every 8 (eight) hours. Indication:  Enterococcal bacteremia complicated by TAVR  Last Day of Therapy:  05/29/2019 Labs - Once weekly:  CBC/D and BMP, Labs - Every other week:  ESR and CRP 102 Units 0  . B-D INS SYR ULTRAFINE 1CC/31G 31G X 5/16" 1 ML MISC USE TO INJECT INSULIN TWICE A DAY AS INSTRUCTED (Patient taking differently: 1 Stick by Other route 3 (three) times daily before meals. ) 100 each 2  . Calcium Carbonate-Vitamin D (CALCIUM 600+D) 600-400 MG-UNIT per tablet Take 1 tablet by mouth 2 (two) times daily. (Patient taking differently: Take 1 tablet by mouth 3 (three) times a week. )    . carvedilol (COREG) 25 MG tablet TAKE 1 TABLET BY MOUTH TWICE A DAY 180 tablet 3  . Cholecalciferol (VITAMIN D3) 50 MCG (2000 UT) TABS Take 2,000 Units by mouth daily.    Marland Kitchen ELIQUIS 5 MG TABS tablet TAKE 1 TABLET BY MOUTH TWICE A DAY 180 tablet 0  . Ensure Max Protein (ENSURE MAX PROTEIN) LIQD Take 330 mLs (11 oz total) by mouth at bedtime. 330 mL mL  . fenofibrate  (TRICOR) 48 MG tablet Take 1 tablet (48 mg total) by mouth daily. 90 tablet 3  . heparin lock flush 100 UNIT/ML SOLN injection 2.5 mLs (250 Units total) by Intracatheter route as needed (line care). 2.5 mL 0  . hydrALAZINE (APRESOLINE) 10 MG tablet Take 1 tablet (10 mg total) by mouth every 6 (six) hours. 360 tablet 0  . insulin aspart protamine- aspart (NOVOLOG MIX 70/30) (70-30) 100 UNIT/ML injection Inject 60-75 Units into the skin See admin instructions. Inject 75 units in the morning and 60 units at lunch and 60 units at bedtime.    . Insulin Pen Needle 32G X 4 MM MISC Use to inject insulin with insulin pen 100 each 4  . LIVALO 4 MG TABS TAKE 1 TABLET BY MOUTH EVERY DAY 90 tablet 1  . meclizine (ANTIVERT) 12.5 MG tablet Take 12.5 mg by mouth 3 (three) times daily as needed for dizziness.    . mupirocin ointment (BACTROBAN) 2 % Apply 1 application topically 2 (two) times daily. 22 g 0  . nutrition supplement, JUVEN, (JUVEN) PACK Take 1 packet by mouth 2 (two) times daily between meals. 60 packet 0  . ondansetron (ZOFRAN ODT) 4 MG disintegrating tablet Take 1 tablet (4 mg total) by mouth every 8 (eight) hours as needed for nausea. 10 tablet 0  . Prenatal Vit-Fe Fumarate-FA (PRENATAL MULTIVITAMIN) TABS tablet Take 1 tablet by mouth 3 (three) times a week.    Marland Kitchen Propylene Glycol (SYSTANE COMPLETE) 0.6 % SOLN Apply 1 drop to eye daily as needed (dry eyes).     . torsemide (DEMADEX) 20 MG tablet Take 1 tablet (20 mg total) by mouth 2 (two) times daily. 180 tablet 3  . vitamin E 400 UNIT capsule Take 400 Units by mouth 3 (three) times a week.      No current facility-administered medications for this visit.     Allergies:   Amlodipine, Atorvastatin, Crestor [rosuvastatin calcium], Ezetimibe-simvastatin, Statins, Celebrex [celecoxib], and Levemir [insulin detemir]     ROS:  Please see the history of present illness.   Otherwise, review of systems are positive for none.   All other systems are  reviewed and negative.    PHYSICAL EXAM: VS:  BP (!) 156/62   Pulse 67   Temp (!) 96.7 F (35.9 C)   Ht 5'  7" (1.702 m)   Wt 201 lb (91.2 kg)   SpO2 97%   BMI 31.48 kg/m  , BMI Body mass index is 31.48 kg/m. GEN:  No distress NECK:  No jugular venous distention at 90 degrees, waveform within normal limits, carotid upstroke brisk and symmetric, no bruits, no thyromegaly LYMPHATICS:  No cervical adenopathy LUNGS:  Clear to auscultation bilaterally BACK:  No CVA tenderness CHEST:  Unremarkable HEART:  S1 and S2 within normal limits, no S3, no S4, no clicks, no rubs, 2 out of 6 apical systolic murmur heard also at the right upper sternal border, no diastolic murmurs ABD:  Positive bowel sounds normal in frequency in pitch, no bruits, no rebound, no guarding, unable to assess midline mass or bruit with the patient seated. EXT:  2 plus pulses decreased dorsalis pedis and posterior tibialis, moderate edema, no cyanosis no clubbing, missing great toe on the left foot.  Tense nonpitting edema SKIN:  No rashes no nodules NEURO:  Cranial nerves II through XII grossly intact, motor grossly intact throughout PSYCH:  Cognitively intact, oriented to person place and time  EKG:  EKG is ordered today. The ekg ordered today demonstrates sinus rhythm, rate 88, axis within normal limits, intervals within normal limits, inferolateral T wave inversions.  This was unchanged from previous EKGs.   Recent Labs: 09/05/2018: BNP 116.6 04/16/2019: ALT 17 04/23/2019: Magnesium 2.0 04/26/2019: BUN 66; Creatinine, Ser 2.02; Hemoglobin 11.4; Platelets 295; Potassium 3.7; Sodium 141    Lipid Panel    Component Value Date/Time   CHOL 248 (H) 03/11/2019 0857   CHOL 286 (H) 01/24/2013 1100   TRIG 422 (H) 03/11/2019 0857   TRIG 200 (H) 06/03/2014 1132   TRIG 468 (H) 01/24/2013 1100   HDL 44 03/11/2019 0857   HDL 43 06/03/2014 1132   HDL 47 01/24/2013 1100   CHOLHDL 5.6 (H) 03/11/2019 0857   CHOLHDL 4.3  06/16/2018 0154   VLDL 32 06/16/2018 0154   LDLCALC Comment 03/11/2019 0857   LDLCALC 101 (H) 06/03/2014 1132   LDLCALC 145 (H) 01/24/2013 1100   LDLDIRECT 150 (H) 03/11/2019 0857   LDLDIRECT 113 (H) 02/26/2013 0845      Wt Readings from Last 3 Encounters:  05/22/19 201 lb (91.2 kg)  04/26/19 199 lb 8.3 oz (90.5 kg)  03/14/19 200 lb (90.7 kg)      Other studies Reviewed: Additional studies/ records that were reviewed today include: Hospital records . Review of the above records demonstrates:  Please see elsewhere in the note.     ASSESSMENT AND PLAN:   Endocarditis: The patient is being managed for endocarditis with the description of the TEE as above.  I am going to plan for a repeat TEE to be done after she has completed antibiotics.    Severe AS s/p TAVR:    I sent a message to Dr.McAlhany and we will follow the valves as above.   Acute on chronic diastolic CHF:     She seems to be euvolemic.  At this point she will continue the meds as listed.  No change in therapy.  HTN:    Her blood pressure is elevated.  She is going to keep her blood pressure diary and go up on the hydralazine as needed if blood pressure remains up.   Atrial fibrillation:  She is maintaining sinus rhythm.    Tolerates anticoagulation.  No change in therapy.    Chronic kidney disease, stage 3:  Creatinine is 2.02 most recently.  And this was down from previous.  Will follow up with labs in the future.  Moderate pericardial effusion: There is no mention of this on the recent TEE.  We will look at this again when she has her follow-up study.  DM: A1c is 11.  She is working with an endocrinologist on this.  PVD: She is followed at  [facility] and she has apparently follow-up planned with angiography her left leg.  She had angiography on her right leg.   Current medicines are reviewed at length with the patient today.  The patient does not have concerns regarding medicines.  The following changes  have been made:  no change  Labs/ tests ordered today include:   Orders Placed This Encounter  Procedures  . CBC with Differential/Platelet  . Basic metabolic panel     Disposition:   FU with me after the TEE.     Signed, Minus Breeding, MD  05/22/2019 1:00 PM    [place]

## 2019-05-21 NOTE — H&P (View-Only) (Signed)
Cardiology Office Note   Date:  05/22/2019   ID:  Demecia, [place] [birth year], MRN [id number]  PCP:  [name], DO  Cardiologist:   Minus Breeding, MD   Chief Complaint  Patient presents with  . Endocarditis      History of Present Illness: Margy P Shawn is a 68 y.o. female who presents for follow up after hospitalization for endocarditis.   She had enterococcal bacteremia.  TEE demonstrated no obvious lesions on the TAVR.   There was a possible MV lesion.  She is being treated with six weeks of IV ampicillin.    I personally reviewed the images for this appt. she had a well-preserved ejection fraction.  There is no mention of pericardial effusion.  The mitral valve had moderate thickening with severe mitral annular calcification.  And what appeared to be the middle scallop of the posterior leaflet there was a 1.5 cm x 0.5 cm slightly mobile mass and vegetation cannot be excluded.  There is a bioprosthetic aortic valve with perivalvular leak that was mild.  This did not appear necessarily to be there previously although these images were somewhat suboptimal.  The patient states she has been feeling okay.  She has peripheral vascular disease and has an ulcer on her right foot which was thought possibly to be the nidus of infection and this is healing.  She has a couple of ulcers on her second and third toe on her left foot that are healed or healing.  She sees peripheral vascular specialist at [facility].  She gets around with a walker at home being limited by leg weakness and pain as well as having had an amputation of left great toe.  She denies any acute shortness of breath, PND or orthopnea.  She has had no palpitations, presyncope or syncope.  He has had some dizziness.  She has had no fevers or chills.  He is about to complete antibiotics.  He has not had any of the weekend abdominal distention she has of her acute on chronic diastolic heart failure.  She did have trauma to  her left leg earlier this year with a fracture that was managed conservatively.   Past Medical History:  Diagnosis Date  . Atrial fibrillation ([place])    a. maintaining sinus s/p DCCV, on Eliquis  . CAD (coronary artery disease)    a. Nonobstructive by cath 2006 - 25% LAD, 25-30% after 1st diag, distal 25% PDA, minor irreg LCx. b. Normal nuc 2011.  . CKD (chronic kidney disease), stage III   . Cystocele   . Difficult intubation 09-07-2012   big neck trouble with intubation parotid surgery"takes little med to sedate"  . Esophageal stricture   . GERD (gastroesophageal reflux disease)   . Hiatal hernia    a. 07/2013: HH with small stricture holding up barium tablet (concides with patient's sx of food sticking).  . History of kidney stones   . Hyperlipidemia   . Hypertension   . IBS (irritable bowel syndrome)   . Memory difficulty 11/26/2015  . Morbid obesity ([place])    a. hx of gastric bypass (sleeve gastrectomy 2015)  . Osteoporosis   . 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  Marland Kitchen Pericardial effusion    a. Mod by echo 2012 at time of PNA. b. Echo 07/2012: small pericardial effusion vs fat.  . Pressure ulcer 04/16/2019   right foot  . S/P TAVR (transcatheter aortic valve replacement)   .  Severe aortic stenosis   . Sleep apnea     "have mask; don't use it" (02/14/2018)  . TIA (transient ischemic attack) 10/2014; 06/2016   "some memory issues since; daughter says my speech is sometimes different" (02/14/2018)  . Type II diabetes mellitus ([place])     Past Surgical History:  Procedure Laterality Date  . BREATH TEK H PYLORI N/A 07/29/2013   Procedure: [place];  Surgeon: Shann Medal, MD;  Location: [facility];  Service: General;  Laterality: N/A;  . CARDIAC CATHETERIZATION  2006  . CARDIOVERSION N/A 02/16/2018   Procedure: CARDIOVERSION;  Surgeon: Pixie Casino, MD;  Location: [place];  Service: Cardiovascular;  Laterality: N/A;  . CARPAL TUNNEL  RELEASE Left 2011  . CATARACT EXTRACTION W/ INTRAOCULAR LENS  IMPLANT, BILATERAL Bilateral 2003   left  . CHOLECYSTECTOMY OPEN  1982  . COLONOSCOPY N/A 07/14/2015   Procedure: COLONOSCOPY;  Surgeon: Ladene Artist, MD;  Location: [facility];  Service: Endoscopy;  Laterality: N/A;  . ESOPHAGOGASTRODUODENOSCOPY N/A 12/27/2013   Procedure: ESOPHAGOGASTRODUODENOSCOPY (EGD);  Surgeon: Shann Medal, MD;  Location: [facility];  Service: General;  Laterality: N/A;  . ESOPHAGOGASTRODUODENOSCOPY (EGD) WITH ESOPHAGEAL DILATION    . INTRAOPERATIVE TRANSTHORACIC ECHOCARDIOGRAM  05/08/2018   Procedure: INTRAOPERATIVE TRANSTHORACIC ECHOCARDIOGRAM;  Surgeon: Burnell Blanks, MD;  Location: [place];  Service: Open Heart Surgery;;  . IR FLUORO GUIDE CV LINE RIGHT  04/25/2019  . IR US GUIDE VASC ACCESS RIGHT  04/25/2019  . LAPAROSCOPIC GASTRIC SLEEVE RESECTION N/A 01/20/2014   Procedure: LAPAROSCOPIC GASTRIC SLEEVE RESECTION;  Surgeon: Shann Medal, MD;  Location: [facility];  Service: General;  Laterality: N/A;  . PAROTIDECTOMY  09/07/2012   Procedure: PAROTIDECTOMY;  Surgeon: Melida Quitter, MD;  Location: [place];  Service: ENT;  Laterality: Left;  LEFT PAROTIDECTOMY  . RIGHT HEART CATH N/A 09/25/2018   Procedure: RIGHT HEART CATH;  Surgeon: Larey Dresser, MD;  Location: [place] [facility];  Service: Cardiovascular;  Laterality: N/A;  . RIGHT/LEFT HEART CATH AND CORONARY ANGIOGRAPHY N/A 03/29/2018   Procedure: RIGHT/LEFT HEART CATH AND CORONARY ANGIOGRAPHY;  Surgeon: Burnell Blanks, MD;  Location: [place] [facility];  Service: Cardiovascular;  Laterality: N/A;  . TEE WITHOUT CARDIOVERSION N/A 02/16/2018   Procedure: TRANSESOPHAGEAL ECHOCARDIOGRAM (TEE);  Surgeon: Pixie Casino, MD;  Location: [place];  Service: Cardiovascular;  Laterality: N/A;  . TEE WITHOUT CARDIOVERSION N/A 04/24/2019   Procedure: TRANSESOPHAGEAL ECHOCARDIOGRAM (TEE);  Surgeon: Sanda Klein, MD;  Location: [place];   Service: Cardiovascular;  Laterality: N/A;  . TOE AMPUTATION Left 07/2017   great toe; [facility]   . TONSILLECTOMY  1968  . TRANSCATHETER AORTIC VALVE REPLACEMENT, TRANSFEMORAL  05/08/2018   Transcatheter Aortic Valve Replacement   . TRANSCATHETER AORTIC VALVE REPLACEMENT, TRANSFEMORAL N/A 05/08/2018   Procedure: TRANSCATHETER AORTIC VALVE REPLACEMENT, TRANSFEMORAL;  Surgeon: Burnell Blanks, MD;  Location: [place];  Service: Open Heart Surgery;  Laterality: N/A;  . TUBAL LIGATION  yrs ago  . UPPER GI ENDOSCOPY  01/20/2014   Procedure: UPPER GI ENDOSCOPY;  Surgeon: Shann Medal, MD;  Location[facility];  Service: General;;     Current Outpatient Medications  Medication Sig Dispense Refill  . ACCU-CHEK AVIVA PLUS test strip USE TO CHECK BLOOD SUGAR UP TO TWICE DAILY OR AS INSTRUCTED 100 each 2  . Alirocumab (PRALUENT) 75 MG/ML SOAJ Inject 75 mg into the skin every 14 (fourteen) days. 2 pen 6  . ampicillin IVPB  Inject 2 g into the vein every 8 (eight) hours. Indication:  Enterococcal bacteremia complicated by TAVR  Last Day of Therapy:  05/29/2019 Labs - Once weekly:  CBC/D and BMP, Labs - Every other week:  ESR and CRP 102 Units 0  . B-D INS SYR ULTRAFINE 1CC/31G 31G X 5/16" 1 ML MISC USE TO INJECT INSULIN TWICE A DAY AS INSTRUCTED (Patient taking differently: 1 Stick by Other route 3 (three) times daily before meals. ) 100 each 2  . Calcium Carbonate-Vitamin D (CALCIUM 600+D) 600-400 MG-UNIT per tablet Take 1 tablet by mouth 2 (two) times daily. (Patient taking differently: Take 1 tablet by mouth 3 (three) times a week. )    . carvedilol (COREG) 25 MG tablet TAKE 1 TABLET BY MOUTH TWICE A DAY 180 tablet 3  . Cholecalciferol (VITAMIN D3) 50 MCG (2000 UT) TABS Take 2,000 Units by mouth daily.    Marland Kitchen ELIQUIS 5 MG TABS tablet TAKE 1 TABLET BY MOUTH TWICE A DAY 180 tablet 0  . Ensure Max Protein (ENSURE MAX PROTEIN) LIQD Take 330 mLs (11 oz total) by mouth at bedtime. 330 mL mL  . fenofibrate  (TRICOR) 48 MG tablet Take 1 tablet (48 mg total) by mouth daily. 90 tablet 3  . heparin lock flush 100 UNIT/ML SOLN injection 2.5 mLs (250 Units total) by Intracatheter route as needed (line care). 2.5 mL 0  . hydrALAZINE (APRESOLINE) 10 MG tablet Take 1 tablet (10 mg total) by mouth every 6 (six) hours. 360 tablet 0  . insulin aspart protamine- aspart (NOVOLOG MIX 70/30) (70-30) 100 UNIT/ML injection Inject 60-75 Units into the skin See admin instructions. Inject 75 units in the morning and 60 units at lunch and 60 units at bedtime.    . Insulin Pen Needle 32G X 4 MM MISC Use to inject insulin with insulin pen 100 each 4  . LIVALO 4 MG TABS TAKE 1 TABLET BY MOUTH EVERY DAY 90 tablet 1  . meclizine (ANTIVERT) 12.5 MG tablet Take 12.5 mg by mouth 3 (three) times daily as needed for dizziness.    . mupirocin ointment (BACTROBAN) 2 % Apply 1 application topically 2 (two) times daily. 22 g 0  . nutrition supplement, JUVEN, (JUVEN) PACK Take 1 packet by mouth 2 (two) times daily between meals. 60 packet 0  . ondansetron (ZOFRAN ODT) 4 MG disintegrating tablet Take 1 tablet (4 mg total) by mouth every 8 (eight) hours as needed for nausea. 10 tablet 0  . Prenatal Vit-Fe Fumarate-FA (PRENATAL MULTIVITAMIN) TABS tablet Take 1 tablet by mouth 3 (three) times a week.    Marland Kitchen Propylene Glycol (SYSTANE COMPLETE) 0.6 % SOLN Apply 1 drop to eye daily as needed (dry eyes).     . torsemide (DEMADEX) 20 MG tablet Take 1 tablet (20 mg total) by mouth 2 (two) times daily. 180 tablet 3  . vitamin E 400 UNIT capsule Take 400 Units by mouth 3 (three) times a week.      No current facility-administered medications for this visit.     Allergies:   Amlodipine, Atorvastatin, Crestor [rosuvastatin calcium], Ezetimibe-simvastatin, Statins, Celebrex [celecoxib], and Levemir [insulin detemir]     ROS:  Please see the history of present illness.   Otherwise, review of systems are positive for none.   All other systems are  reviewed and negative.    PHYSICAL EXAM: VS:  BP (!) 156/62   Pulse 67   Temp (!) 96.7 F (35.9 C)   Ht 5'  7" (1.702 m)   Wt 201 lb (91.2 kg)   SpO2 97%   BMI 31.48 kg/m  , BMI Body mass index is 31.48 kg/m. GEN:  No distress NECK:  No jugular venous distention at 90 degrees, waveform within normal limits, carotid upstroke brisk and symmetric, no bruits, no thyromegaly LYMPHATICS:  No cervical adenopathy LUNGS:  Clear to auscultation bilaterally BACK:  No CVA tenderness CHEST:  Unremarkable HEART:  S1 and S2 within normal limits, no S3, no S4, no clicks, no rubs, 2 out of 6 apical systolic murmur heard also at the right upper sternal border, no diastolic murmurs ABD:  Positive bowel sounds normal in frequency in pitch, no bruits, no rebound, no guarding, unable to assess midline mass or bruit with the patient seated. EXT:  2 plus pulses decreased dorsalis pedis and posterior tibialis, moderate edema, no cyanosis no clubbing, missing great toe on the left foot.  Tense nonpitting edema SKIN:  No rashes no nodules NEURO:  Cranial nerves II through XII grossly intact, motor grossly intact throughout PSYCH:  Cognitively intact, oriented to person place and time  EKG:  EKG is ordered today. The ekg ordered today demonstrates sinus rhythm, rate 88, axis within normal limits, intervals within normal limits, inferolateral T wave inversions.  This was unchanged from previous EKGs.   Recent Labs: 09/05/2018: BNP 116.6 04/16/2019: ALT 17 04/23/2019: Magnesium 2.0 04/26/2019: BUN 66; Creatinine, Ser 2.02; Hemoglobin 11.4; Platelets 295; Potassium 3.7; Sodium 141    Lipid Panel    Component Value Date/Time   CHOL 248 (H) 03/11/2019 0857   CHOL 286 (H) 01/24/2013 1100   TRIG 422 (H) 03/11/2019 0857   TRIG 200 (H) 06/03/2014 1132   TRIG 468 (H) 01/24/2013 1100   HDL 44 03/11/2019 0857   HDL 43 06/03/2014 1132   HDL 47 01/24/2013 1100   CHOLHDL 5.6 (H) 03/11/2019 0857   CHOLHDL 4.3  06/16/2018 0154   VLDL 32 06/16/2018 0154   LDLCALC Comment 03/11/2019 0857   LDLCALC 101 (H) 06/03/2014 1132   LDLCALC 145 (H) 01/24/2013 1100   LDLDIRECT 150 (H) 03/11/2019 0857   LDLDIRECT 113 (H) 02/26/2013 0845      Wt Readings from Last 3 Encounters:  05/22/19 201 lb (91.2 kg)  04/26/19 199 lb 8.3 oz (90.5 kg)  03/14/19 200 lb (90.7 kg)      Other studies Reviewed: Additional studies/ records that were reviewed today include: Hospital records . Review of the above records demonstrates:  Please see elsewhere in the note.     ASSESSMENT AND PLAN:   Endocarditis: The patient is being managed for endocarditis with the description of the TEE as above.  I am going to plan for a repeat TEE to be done after she has completed antibiotics.    Severe AS s/p TAVR:    I sent a message to Dr.McAlhany and we will follow the valves as above.   Acute on chronic diastolic CHF:     She seems to be euvolemic.  At this point she will continue the meds as listed.  No change in therapy.  HTN:    Her blood pressure is elevated.  She is going to keep her blood pressure diary and go up on the hydralazine as needed if blood pressure remains up.   Atrial fibrillation:  She is maintaining sinus rhythm.    Tolerates anticoagulation.  No change in therapy.    Chronic kidney disease, stage 3:  Creatinine is 2.02 most recently.  And this was down from previous.  Will follow up with labs in the future.  Moderate pericardial effusion: There is no mention of this on the recent TEE.  We will look at this again when she has her follow-up study.  DM: A1c is 11.  She is working with an endocrinologist on this.  PVD: She is followed at  [facility] and she has apparently follow-up planned with angiography her left leg.  She had angiography on her right leg.   Current medicines are reviewed at length with the patient today.  The patient does not have concerns regarding medicines.  The following changes  have been made:  no change  Labs/ tests ordered today include:   Orders Placed This Encounter  Procedures  . CBC with Differential/Platelet  . Basic metabolic panel     Disposition:   FU with me after the TEE.     Signed, Minus Breeding, MD  05/22/2019 1:00 PM    [place]

## 2019-05-22 ENCOUNTER — Telehealth (HOSPITAL_COMMUNITY): Payer: Self-pay | Admitting: Pharmacist

## 2019-05-22 ENCOUNTER — Encounter: Payer: Self-pay | Admitting: Cardiology

## 2019-05-22 ENCOUNTER — Ambulatory Visit (INDEPENDENT_AMBULATORY_CARE_PROVIDER_SITE_OTHER): Payer: Medicare Other | Admitting: Cardiology

## 2019-05-22 ENCOUNTER — Other Ambulatory Visit: Payer: Self-pay

## 2019-05-22 VITALS — BP 156/62 | HR 67 | Temp 96.7°F | Ht 67.0 in | Wt 201.0 lb

## 2019-05-22 DIAGNOSIS — N183 Chronic kidney disease, stage 3 unspecified: Secondary | ICD-10-CM | POA: Diagnosis not present

## 2019-05-22 DIAGNOSIS — E1122 Type 2 diabetes mellitus with diabetic chronic kidney disease: Secondary | ICD-10-CM | POA: Diagnosis not present

## 2019-05-22 DIAGNOSIS — I330 Acute and subacute infective endocarditis: Secondary | ICD-10-CM

## 2019-05-22 DIAGNOSIS — I3139 Other pericardial effusion (noninflammatory): Secondary | ICD-10-CM

## 2019-05-22 DIAGNOSIS — Z952 Presence of prosthetic heart valve: Secondary | ICD-10-CM | POA: Diagnosis not present

## 2019-05-22 DIAGNOSIS — I10 Essential (primary) hypertension: Secondary | ICD-10-CM

## 2019-05-22 DIAGNOSIS — I2510 Atherosclerotic heart disease of native coronary artery without angina pectoris: Secondary | ICD-10-CM | POA: Diagnosis not present

## 2019-05-22 DIAGNOSIS — I5032 Chronic diastolic (congestive) heart failure: Secondary | ICD-10-CM

## 2019-05-22 DIAGNOSIS — I313 Pericardial effusion (noninflammatory): Secondary | ICD-10-CM

## 2019-05-22 NOTE — Patient Instructions (Addendum)
Medication Instructions:  Your physician recommends that you continue on your current medications as directed. Please refer to the Current Medication list given to you today.  If you need a refill on your cardiac medications before your next appointment, please call your pharmacy.   Lab work: COVID SCREENING TO BE DONE AT [facility] ON 06/01/19 @ 10:15am.    BMET and CBC If you have labs (blood work) drawn today and your tests are completely normal, you will receive your results only by: [place] (if you have MyChart) OR A paper copy in the mail If you have any lab test that is abnormal or we need to change your treatment, we will call you to review the results.  Testing/Procedures: Your physician has requested that you have a TEE. During a TEE, sound waves are used to create images of your heart. It provides your doctor with information about the size and shape of your heart and how well your heart's chambers and valves are working. In this test, a transducer is attached to the end of a flexible tube that's guided down your throat and into your esophagus (the tube leading from you mouth to your stomach) to get a more detailed image of your heart. You are not awake for the procedure. Please see the instruction sheet given to you today. For further information please visit [email or web address].   Follow-Up: Your physician recommends that you schedule a follow-up appointment in: 1 month after TEE on 06/05/19    Any Other Special Instructions Will Be Listed Below (If Applicable).  You are scheduled for a TEE on 06/05/19 with Dr. Sallyanne Kuster.  Please arrive at the [facility] (Main Entrance A) at [facility]: [address] [place], [place] [address] at 7:45 am (1 hour prior to procedure unless lab work is needed; if lab work is needed arrive 1.5 hours ahead)  DIET: Nothing to eat or drink after midnight except a sip of water with medications (see medication instructions  below)  Medication Instructions: Hold INSULIN AND TORSEMIDE MORNING OF  Labs:   BMET, CBC within 1 week ON 05/31/19, OK TO EAT AND DRINK PRIOR   Come to: [facility] must have a responsible person to drive you home and stay in the waiting area during your procedure. Failure to do so could result in cancellation.   Bring your insurance cards.  *Special Note: Every effort is made to have your procedure done on time. Occasionally there are emergencies that occur at the hospital that may cause delays. Please be patient if a delay does occur.

## 2019-05-22 NOTE — Telephone Encounter (Signed)
Advanced Heart Failure Patient Advocate Encounter  Prior Authorization for Tafamidis [facility]) has been approved.    Effective dates: 05/16/19 through 08/15/19  Patients co-pay is $1037/month  PA status and co-pay information submitted to [facility]. Manufacturer's Assistance application will now move forward.   Audry Riles, PharmD, BCPS, CPP Heart Failure Clinic Pharmacist [phone]

## 2019-05-23 ENCOUNTER — Ambulatory Visit (INDEPENDENT_AMBULATORY_CARE_PROVIDER_SITE_OTHER): Payer: Medicare Other | Admitting: Infectious Diseases

## 2019-05-23 ENCOUNTER — Encounter: Payer: Self-pay | Admitting: Infectious Diseases

## 2019-05-23 VITALS — BP 126/67 | HR 65 | Temp 97.6°F

## 2019-05-23 DIAGNOSIS — Z23 Encounter for immunization: Secondary | ICD-10-CM

## 2019-05-23 DIAGNOSIS — R7881 Bacteremia: Secondary | ICD-10-CM

## 2019-05-23 DIAGNOSIS — N183 Chronic kidney disease, stage 3 unspecified: Secondary | ICD-10-CM | POA: Diagnosis not present

## 2019-05-23 DIAGNOSIS — I330 Acute and subacute infective endocarditis: Secondary | ICD-10-CM

## 2019-05-23 DIAGNOSIS — I058 Other rheumatic mitral valve diseases: Secondary | ICD-10-CM

## 2019-05-23 DIAGNOSIS — B952 Enterococcus as the cause of diseases classified elsewhere: Secondary | ICD-10-CM | POA: Diagnosis not present

## 2019-05-23 DIAGNOSIS — I059 Rheumatic mitral valve disease, unspecified: Secondary | ICD-10-CM

## 2019-05-23 NOTE — Patient Instructions (Signed)
HH to extend lab orders and current IV ABX until 06/08/2019 while awaiting repeat TEE. Will follow your lab results and determine when your IV catheter needs to be removed.

## 2019-05-23 NOTE — Progress Notes (Signed)
Subjective:    Patient ID: [name], female    DOB: 10-24-1950, 68 y.o.   MRN: [id number]  HPI The patient is a 68 year old obese white female diabetic with stage III chronic kidney disease, a chronic right heel wound, and severe aortic stenosis, status post TAVR who presents today for hospital follow-up for Enterococcal endocarditis.  She was admitted to [facility] from August 31 till September 12th for enterococcal sepsis.  While she was an inpatient, she ultimately underwent a TEE that confirmed a degenerative mitral valve with a possible small vegetation and a perivalvular leak to her TAVR apparatus.  The patient previously was felt to have a penicillin allergy but was given an oral challenge of amoxicillin which she tolerated well and transition to IV ampicillin for the remainder of her IV antibiotics.  Imaging performed to her right heel wound, which was felt to be a source of her enterococcal bacteremia, showed no evidence of osteomyelitis on MRI.  Her glycemic control was slow to improve but her renal function remained stable with a creatinine slightly less than 3 to the low 2 range.  Since her discharge from the hospital, she states that her foot wound has been improving and she is continued to tolerate antibiotics well taken 3 times daily via her right chest wall tunneled catheter.  She met with her cardiologist several days ago who plans to repeat her transesophageal echocardiogram on June 05, 2019.  Her only complaint today is that of loose bowel movements and abdominal bloating.  She does state that she has been eating yogurt daily and also recently added probiotics to her medication regimen over the last week.  She presents in a wheelchair with her husband present at today's visit.   Past Medical History:  Diagnosis Date  . Atrial fibrillation ([place])    a. maintaining sinus s/p DCCV, on Eliquis  . CAD (coronary artery disease)    a. Nonobstructive by cath 2006 - 25%  LAD, 25-30% after 1st diag, distal 25% PDA, minor irreg LCx. b. Normal nuc 2011.  . CKD (chronic kidney disease), stage III   . Cystocele   . Difficult intubation 09-07-2012   big neck trouble with intubation parotid surgery"takes little med to sedate"  . Esophageal stricture   . GERD (gastroesophageal reflux disease)   . Hiatal hernia    a. 07/2013: HH with small stricture holding up barium tablet (concides with patient's sx of food sticking).  . History of kidney stones   . Hyperlipidemia   . Hypertension   . IBS (irritable bowel syndrome)   . Memory difficulty 11/26/2015  . Morbid obesity ([place])    a. hx of gastric bypass (sleeve gastrectomy 2015)  . Osteoporosis   . 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  Marland Kitchen Pericardial effusion    a. Mod by echo 2012 at time of PNA. b. Echo 07/2012: small pericardial effusion vs fat.  . Pressure ulcer 04/16/2019   right foot  . S/P TAVR (transcatheter aortic valve replacement)   . Severe aortic stenosis   . Sleep apnea     "have mask; don't use it" (02/14/2018)  . TIA (transient ischemic attack) 10/2014; 06/2016   "some memory issues since; daughter says my speech is sometimes different" (02/14/2018)  . Type II diabetes mellitus ([place])     Past Surgical History:  Procedure Laterality Date  . BREATH TEK H PYLORI N/A 07/29/2013   Procedure: BREATH TEK H PYLORI;  Surgeon: Shann Medal, MD;  Location: [facility];  Service: General;  Laterality: N/A;  . CARDIAC CATHETERIZATION  2006  . CARDIOVERSION N/A 02/16/2018   Procedure: CARDIOVERSION;  Surgeon: Pixie Casino, MD;  Location: [place];  Service: Cardiovascular;  Laterality: N/A;  . CARPAL TUNNEL RELEASE Left 2011  . CATARACT EXTRACTION W/ INTRAOCULAR LENS  IMPLANT, BILATERAL Bilateral 2003   left  . CHOLECYSTECTOMY OPEN  1982  . COLONOSCOPY N/A 07/14/2015   Procedure: COLONOSCOPY;  Surgeon: Ladene Artist, MD;  Location: [facility];  Service: Endoscopy;   Laterality: N/A;  . ESOPHAGOGASTRODUODENOSCOPY N/A 12/27/2013   Procedure: ESOPHAGOGASTRODUODENOSCOPY (EGD);  Surgeon: Shann Medal, MD;  Location: [facility];  Service: General;  Laterality: N/A;  . ESOPHAGOGASTRODUODENOSCOPY (EGD) WITH ESOPHAGEAL DILATION    . INTRAOPERATIVE TRANSTHORACIC ECHOCARDIOGRAM  05/08/2018   Procedure: INTRAOPERATIVE TRANSTHORACIC ECHOCARDIOGRAM;  Surgeon: Burnell Blanks, MD;  Location: [place];  Service: Open Heart Surgery;;  . IR FLUORO GUIDE CV LINE RIGHT  04/25/2019  . IR US GUIDE VASC ACCESS RIGHT  04/25/2019  . LAPAROSCOPIC GASTRIC SLEEVE RESECTION N/A 01/20/2014   Procedure: LAPAROSCOPIC GASTRIC SLEEVE RESECTION;  Surgeon: Shann Medal, MD;  Location: [facility];  Service: General;  Laterality: N/A;  . PAROTIDECTOMY  09/07/2012   Procedure: PAROTIDECTOMY;  Surgeon: Melida Quitter, MD;  Location: [place];  Service: ENT;  Laterality: Left;  LEFT PAROTIDECTOMY  . RIGHT HEART CATH N/A 09/25/2018   Procedure: RIGHT HEART CATH;  Surgeon: Larey Dresser, MD;  Location: [place] [facility];  Service: Cardiovascular;  Laterality: N/A;  . RIGHT/LEFT HEART CATH AND CORONARY ANGIOGRAPHY N/A 03/29/2018   Procedure: RIGHT/LEFT HEART CATH AND CORONARY ANGIOGRAPHY;  Surgeon: Burnell Blanks, MD;  Location: [place] [facility];  Service: Cardiovascular;  Laterality: N/A;  . TEE WITHOUT CARDIOVERSION N/A 02/16/2018   Procedure: TRANSESOPHAGEAL ECHOCARDIOGRAM (TEE);  Surgeon: Pixie Casino, MD;  Location: [place];  Service: Cardiovascular;  Laterality: N/A;  . TEE WITHOUT CARDIOVERSION N/A 04/24/2019   Procedure: TRANSESOPHAGEAL ECHOCARDIOGRAM (TEE);  Surgeon: Sanda Klein, MD;  Location: [place];  Service: Cardiovascular;  Laterality: N/A;  . TOE AMPUTATION Left 07/2017   great toe; [facility]   . TONSILLECTOMY  1968  . TRANSCATHETER AORTIC VALVE REPLACEMENT, TRANSFEMORAL  05/08/2018   Transcatheter Aortic Valve Replacement   . TRANSCATHETER AORTIC VALVE  REPLACEMENT, TRANSFEMORAL N/A 05/08/2018   Procedure: TRANSCATHETER AORTIC VALVE REPLACEMENT, TRANSFEMORAL;  Surgeon: Burnell Blanks, MD;  Location: [place];  Service: Open Heart Surgery;  Laterality: N/A;  . TUBAL LIGATION  yrs ago  . UPPER GI ENDOSCOPY  01/20/2014   Procedure: UPPER GI ENDOSCOPY;  Surgeon: Shann Medal, MD;  Location: [facility];  Service: General;;     Family History  Problem Relation Age of Onset  . Heart failure Mother   . Hypertension Mother   . Skin cancer Mother   . Colitis Mother   . Diabetes Mother   . Kidney Stones Mother   . Stroke Mother   . Heart disease Father   . Colon polyps Father   . Diabetes Father   . Heart failure Father   . Heart attack Sister   . Diabetes Other   . Stroke Maternal Grandfather   . Diabetes Maternal Grandfather   . Colon cancer Neg Hx      Social History   Tobacco Use  . Smoking status: Never Smoker  . Smokeless tobacco: Never Used  Substance Use Topics  . Alcohol  use: Yes    Alcohol/week: 0.0 standard drinks    Comment:  "mixed drink q few years"  . Drug use: Never      reports being sexually active. She reports using the following method of birth control/protection: Post-menopausal.   Outpatient Medications Prior to Visit  Medication Sig Dispense Refill  . ACCU-CHEK AVIVA PLUS test strip USE TO CHECK BLOOD SUGAR UP TO TWICE DAILY OR AS INSTRUCTED 100 each 2  . Alirocumab (PRALUENT) 75 MG/ML SOAJ Inject 75 mg into the skin every 14 (fourteen) days. 2 pen 6  . ampicillin IVPB Inject 2 g into the vein every 8 (eight) hours. Indication:  Enterococcal bacteremia complicated by TAVR  Last Day of Therapy:  05/29/2019 Labs - Once weekly:  CBC/D and BMP, Labs - Every other week:  ESR and CRP 102 Units 0  . B-D INS SYR ULTRAFINE 1CC/31G 31G X 5/16" 1 ML MISC USE TO INJECT INSULIN TWICE A DAY AS INSTRUCTED (Patient taking differently: 1 Stick by Other route 3 (three) times daily before meals. ) 100 each 2  . Calcium  Carbonate-Vitamin D (CALCIUM 600+D) 600-400 MG-UNIT per tablet Take 1 tablet by mouth 2 (two) times daily. (Patient taking differently: Take 1 tablet by mouth 3 (three) times a week. )    . carvedilol (COREG) 25 MG tablet TAKE 1 TABLET BY MOUTH TWICE A DAY 180 tablet 3  . Cholecalciferol (VITAMIN D3) 50 MCG (2000 UT) TABS Take 2,000 Units by mouth daily.    Marland Kitchen ELIQUIS 5 MG TABS tablet TAKE 1 TABLET BY MOUTH TWICE A DAY 180 tablet 0  . Ensure Max Protein (ENSURE MAX PROTEIN) LIQD Take 330 mLs (11 oz total) by mouth at bedtime. 330 mL mL  . fenofibrate (TRICOR) 48 MG tablet Take 1 tablet (48 mg total) by mouth daily. 90 tablet 3  . heparin lock flush 100 UNIT/ML SOLN injection 2.5 mLs (250 Units total) by Intracatheter route as needed (line care). 2.5 mL 0  . hydrALAZINE (APRESOLINE) 10 MG tablet Take 1 tablet (10 mg total) by mouth every 6 (six) hours. 360 tablet 0  . insulin aspart protamine- aspart (NOVOLOG MIX 70/30) (70-30) 100 UNIT/ML injection Inject 60-75 Units into the skin See admin instructions. Inject 75 units in the morning and 60 units at lunch and 60 units at bedtime.    . Insulin Pen Needle 32G X 4 MM MISC Use to inject insulin with insulin pen 100 each 4  . LIVALO 4 MG TABS TAKE 1 TABLET BY MOUTH EVERY DAY 90 tablet 1  . meclizine (ANTIVERT) 12.5 MG tablet Take 12.5 mg by mouth 3 (three) times daily as needed for dizziness.    . mupirocin ointment (BACTROBAN) 2 % Apply 1 application topically 2 (two) times daily. 22 g 0  . nutrition supplement, JUVEN, (JUVEN) PACK Take 1 packet by mouth 2 (two) times daily between meals. 60 packet 0  . ondansetron (ZOFRAN ODT) 4 MG disintegrating tablet Take 1 tablet (4 mg total) by mouth every 8 (eight) hours as needed for nausea. 10 tablet 0  . Prenatal Vit-Fe Fumarate-FA (PRENATAL MULTIVITAMIN) TABS tablet Take 1 tablet by mouth 3 (three) times a week.    Marland Kitchen Propylene Glycol (SYSTANE COMPLETE) 0.6 % SOLN Apply 1 drop to eye daily as needed (dry eyes).      . torsemide (DEMADEX) 20 MG tablet Take 1 tablet (20 mg total) by mouth 2 (two) times daily. 180 tablet 3  . vitamin E 400 UNIT  capsule Take 400 Units by mouth 3 (three) times a week.      No facility-administered medications prior to visit.      Allergies  Allergen Reactions  . Amlodipine Swelling    Ankle swelling  . Atorvastatin Other (See Comments)    Myalgias   . Crestor [Rosuvastatin Calcium] Other (See Comments)    Stiffness and back pain  . Ezetimibe-Simvastatin Other (See Comments)    Leg cramps  . Statins Other (See Comments)    Myalgias  . Celebrex [Celecoxib] Rash  . Levemir [Insulin Detemir] Rash      Review of Systems  Constitutional: Positive for fatigue. Negative for chills and fever.  HENT: Negative for congestion, hearing loss and sinus pressure.   Eyes: Negative for photophobia, discharge, redness and visual disturbance.  Respiratory: Negative for apnea, cough, shortness of breath and wheezing.   Cardiovascular: Negative for chest pain and leg swelling.  Gastrointestinal: Positive for abdominal distention. Negative for abdominal pain, constipation, diarrhea, nausea and vomiting.       Since starting probiotics  Endocrine: Negative for cold intolerance, heat intolerance, polydipsia and polyuria.  Genitourinary: Negative for dysuria, flank pain, frequency, urgency, vaginal bleeding and vaginal discharge.  Musculoskeletal: Negative for arthralgias, back pain, joint swelling and neck pain.  Skin: Positive for wound. Negative for pallor and rash.       RT foot  Allergic/Immunologic: Negative for immunocompromised state.  Neurological: Negative for dizziness, seizures, speech difficulty, weakness and headaches.  Hematological: Does not bruise/bleed easily.  Psychiatric/Behavioral: Negative for agitation, confusion, hallucinations and sleep disturbance. The patient is nervous/anxious.        Objective:     Vitals:   05/23/19 0946  BP: 126/67  Pulse:  65  Temp: 97.6 F (36.4 C)     Physical Exam Gen: anxious obese, chronically ill, NAD, A&Ox 3 Head: NCAT, no temporal wasting evident EENT: PERRL, EOMI, MMM, adequate dentition Neck: supple, no JVD CV: NRRR, I/VI diastolic murmur at LUSB, RT chest wall tunnelled catheter intact w/o surrounding erythema Pulm: CTA bilaterally, no wheeze or retractions Abd: soft, obese, NTND, +BS Extrems:  trace LE edema, 1+ pulses Skin: no rashes, adequate skin turgor Neuro: CN II-XII grossly intact, no focal neurologic deficits appreciated, gait was not assessed as pt presented in wheelchair today, A&Ox 3   Labs: Lab Results  Component Value Date   WBC 6.6 04/26/2019   HGB 11.4 (L) 04/26/2019   HCT 35.8 (L) 04/26/2019   MCV 85.2 04/26/2019   PLT 295 04/26/2019   Lab Results  Component Value Date   NA 141 04/26/2019   K 3.7 04/26/2019   CL 104 04/26/2019   CO2 27 04/26/2019   GLUCOSE 194 (H) 04/26/2019   BUN 66 (H) 04/26/2019   CREATININE 2.02 (H) 04/26/2019   CALCIUM 8.9 04/26/2019   MG 2.0 04/23/2019   PHOS 4.2 04/23/2019   Lab Results  Component Value Date   CRP 10.2 (H) 04/16/2019          Assessment & Plan:  Patient is a 68 year old morbidly obese white female status post gastric bypass with stage III chronic kidney disease, h/o severe aortic stenosis, s/p TAVRand a chronic right heel ulcer admitted with malaise and subsequently found to have Enterococcal bacteremia.  1. Enterococcal sepsis/mitral valve endocarditis -both of the patient's initial blood cultures are now positive for Enterococcus faecalis.She had a reported allergy to penicillin which caused nausea and potentially emesis when she received an IM penicillin injection many years  ago. As her Enterococcus was ampicillin sensitive,she was given a test dose of oral penicillin which she tolerated and then was transitioned to IV Unasyn instead. I had pharmacy remove PCN as an allergy from the patient's chart. Repeat  blood cultures were negative, thus establishing clearance of her bacteremia. Given her history of a TAVR, a TEE was performed, which showed no evidence to endocarditis to her TAVR but did confirm a perivalvular leak. She did have a reported "degenerative mitral valve" with a possible mitral valve vegetation, however. Given the chronic nature of her right heel wound and poorly controlled diabetes, she was felt to be rather high risk for subacute endocarditis, so I elected to treat as such. Ideally, dual beta-lactam therapy would be entertained with rocephin/ampicillin, but given her pre-existing stage IV CRI and risk for ARF with dual beta-lactams, I converted her ABX regimen to ampicillin 2 gm IV q 8 hrs as monotherapy for an anticipated 6 week duration instead. As her prosthetic valve/TAVR is not overtly infected, I avoided rifampin. I initially planned a tentative ABX d/c date of 05/29/2019 to complete 6 weeks of treatment; however, her cardiologist wishes to perform a repeat TEE near the end of her treatment, so we will extend her antibiotics until June 08, 2019 to allow for the study to be performed around October 21 per her report.  The patient is agreeable to this plan.  If her transesophageal echocardiogram shows no further progression or abnormalities, we will arrange for her tunneled PICC line line to be removed by radiology.  Should her study show progression, she will likely need to be admitted for CT surgery evaluation as she would have failed medical standard of care treatment with prolonged IV antibiotics.  2. RT heel ulcer - The patient admits she has had a chronic ulcer to her right heel and ankle since February of this year. It is unclear why she did not seek medical attention for this wound earlier although apparently she was asking her daughter who is a wound care nurse to facilitate this care. Baseline CRP here was 10.2. Fortunately, an MRI of her RT ankle showed no evidence of  osteomyelitis but as she admits to frequent urinary incontinence over the past 6-7 months, urinary contamination of this wound would serve as the most likely route by which she became bacteremic. I have encouraged the patient to seek and remain in care at a local wound care center post-hospital d/c.  We will continue her IV ampicillin as noted above.  With ongoing antibiotics the patient's wound has been slowly closing but remains present.  3. CRI - The patient's Cr baseline is unclear as she has ranged from 1.78 to 3.21 within the past 9 months, mostly with a trend of worsening in recent months. This week, her Cr was 2.20, more consistent with stage IV renal disease. It is difficult to say if this is true AoCRF, particularly as her DM has been mostly poorly controlled with ambulatory blood sugars in the mid 200s-low 300s in recent months per the patient.Due to this issue, I arranged placement of a tunneled catheter placement for the completion of her IV ABX as an OP. The patient remains in denial re: the severity of her renal disease as well as many of her medical co-morbidities at this time.

## 2019-05-24 ENCOUNTER — Telehealth: Payer: Self-pay

## 2019-05-24 NOTE — Telephone Encounter (Signed)
Per Dr. Prince Rome called [place] care to extend IV antibiotics. Spoke to [place], and relayed new orders to extend IV antibiotics and to continue the same labs.  Melissa confirmed the correct patient, she then relayed understanding with feedback.   Justeen  Was made aware of new plans by CMA on office visit with Dr.Powers on 05/23/2019. Charlet had no questions or concerns.   Lenore Cordia, [place]

## 2019-05-27 ENCOUNTER — Ambulatory Visit: Payer: Medicare Other

## 2019-05-27 ENCOUNTER — Telehealth: Payer: Self-pay | Admitting: Pharmacist

## 2019-05-27 DIAGNOSIS — I2510 Atherosclerotic heart disease of native coronary artery without angina pectoris: Secondary | ICD-10-CM | POA: Diagnosis not present

## 2019-05-27 DIAGNOSIS — M810 Age-related osteoporosis without current pathological fracture: Secondary | ICD-10-CM | POA: Diagnosis not present

## 2019-05-27 DIAGNOSIS — T8203XD Leakage of heart valve prosthesis, subsequent encounter: Secondary | ICD-10-CM | POA: Diagnosis not present

## 2019-05-27 DIAGNOSIS — A4181 Sepsis due to Enterococcus: Secondary | ICD-10-CM | POA: Diagnosis not present

## 2019-05-27 DIAGNOSIS — I129 Hypertensive chronic kidney disease with stage 1 through stage 4 chronic kidney disease, or unspecified chronic kidney disease: Secondary | ICD-10-CM | POA: Diagnosis not present

## 2019-05-27 DIAGNOSIS — I4891 Unspecified atrial fibrillation: Secondary | ICD-10-CM | POA: Diagnosis not present

## 2019-05-27 DIAGNOSIS — E1122 Type 2 diabetes mellitus with diabetic chronic kidney disease: Secondary | ICD-10-CM | POA: Diagnosis not present

## 2019-05-27 DIAGNOSIS — E1142 Type 2 diabetes mellitus with diabetic polyneuropathy: Secondary | ICD-10-CM | POA: Diagnosis not present

## 2019-05-27 DIAGNOSIS — N200 Calculus of kidney: Secondary | ICD-10-CM | POA: Diagnosis not present

## 2019-05-27 DIAGNOSIS — I313 Pericardial effusion (noninflammatory): Secondary | ICD-10-CM | POA: Diagnosis not present

## 2019-05-27 DIAGNOSIS — Z4800 Encounter for change or removal of nonsurgical wound dressing: Secondary | ICD-10-CM | POA: Diagnosis not present

## 2019-05-27 DIAGNOSIS — I058 Other rheumatic mitral valve diseases: Secondary | ICD-10-CM | POA: Diagnosis not present

## 2019-05-27 DIAGNOSIS — G4730 Sleep apnea, unspecified: Secondary | ICD-10-CM | POA: Diagnosis not present

## 2019-05-27 DIAGNOSIS — K589 Irritable bowel syndrome without diarrhea: Secondary | ICD-10-CM | POA: Diagnosis not present

## 2019-05-27 DIAGNOSIS — K449 Diaphragmatic hernia without obstruction or gangrene: Secondary | ICD-10-CM | POA: Diagnosis not present

## 2019-05-27 DIAGNOSIS — N184 Chronic kidney disease, stage 4 (severe): Secondary | ICD-10-CM | POA: Diagnosis not present

## 2019-05-27 DIAGNOSIS — L97312 Non-pressure chronic ulcer of right ankle with fat layer exposed: Secondary | ICD-10-CM | POA: Diagnosis not present

## 2019-05-27 DIAGNOSIS — E1165 Type 2 diabetes mellitus with hyperglycemia: Secondary | ICD-10-CM | POA: Diagnosis not present

## 2019-05-27 DIAGNOSIS — M47819 Spondylosis without myelopathy or radiculopathy, site unspecified: Secondary | ICD-10-CM | POA: Diagnosis not present

## 2019-05-27 DIAGNOSIS — K5730 Diverticulosis of large intestine without perforation or abscess without bleeding: Secondary | ICD-10-CM | POA: Diagnosis not present

## 2019-05-27 DIAGNOSIS — I872 Venous insufficiency (chronic) (peripheral): Secondary | ICD-10-CM | POA: Diagnosis not present

## 2019-05-27 DIAGNOSIS — K222 Esophageal obstruction: Secondary | ICD-10-CM | POA: Diagnosis not present

## 2019-05-27 DIAGNOSIS — E1151 Type 2 diabetes mellitus with diabetic peripheral angiopathy without gangrene: Secondary | ICD-10-CM | POA: Diagnosis not present

## 2019-05-27 DIAGNOSIS — I700 Atherosclerosis of aorta: Secondary | ICD-10-CM | POA: Diagnosis not present

## 2019-05-27 NOTE — Telephone Encounter (Signed)
Called patient to set up lipids labs. Patient has only taken about 2 does of Praluent due to being in the hospital. Will call patient in another month to set up labs. Prefers to do labs at [facility]

## 2019-05-28 ENCOUNTER — Telehealth (HOSPITAL_COMMUNITY): Payer: Self-pay | Admitting: Pharmacist

## 2019-05-28 ENCOUNTER — Other Ambulatory Visit (HOSPITAL_COMMUNITY): Payer: Self-pay | Admitting: Cardiology

## 2019-05-28 DIAGNOSIS — Z4800 Encounter for change or removal of nonsurgical wound dressing: Secondary | ICD-10-CM | POA: Diagnosis not present

## 2019-05-28 DIAGNOSIS — E1151 Type 2 diabetes mellitus with diabetic peripheral angiopathy without gangrene: Secondary | ICD-10-CM | POA: Diagnosis not present

## 2019-05-28 DIAGNOSIS — T8203XD Leakage of heart valve prosthesis, subsequent encounter: Secondary | ICD-10-CM | POA: Diagnosis not present

## 2019-05-28 DIAGNOSIS — I872 Venous insufficiency (chronic) (peripheral): Secondary | ICD-10-CM | POA: Diagnosis not present

## 2019-05-28 DIAGNOSIS — L97312 Non-pressure chronic ulcer of right ankle with fat layer exposed: Secondary | ICD-10-CM | POA: Diagnosis not present

## 2019-05-28 DIAGNOSIS — A4181 Sepsis due to Enterococcus: Secondary | ICD-10-CM | POA: Diagnosis not present

## 2019-05-28 NOTE — Telephone Encounter (Addendum)
Advanced Heart Failure Patient Advocate Encounter   Patient was approved to receive Tafamidis [facility]) from [facility].  Patient ID: [id number] Phone: [phone] Effective dates: 05/27/2019 through 08/15/2019  Provided initial counseling and informed patient of enrollment in program.   Audry Riles, PharmD, BCPS, [facility] Pharmacist [phone]

## 2019-05-29 ENCOUNTER — Telehealth (HOSPITAL_COMMUNITY): Payer: Self-pay | Admitting: Pharmacy Technician

## 2019-05-29 NOTE — Telephone Encounter (Signed)
Received a call from [facility], they are all set to get the patient her Vyndamax. The only thing left is that the patient has to confirm her mailing address before they can send anything and they have been unsuccessful in reaching her.   Called the patient, provided her the phone number to [facility] and explained what to do. She was agreeable to calling them to set up shipment.  Told her to call me with any issues.  Charlann Boxer, CPhT

## 2019-05-29 NOTE — Telephone Encounter (Signed)
[facility] office called to confirm that patient IV antibiotics was extended. Per MD's last note patient was extended through 10/24. Coretta was able to take orders and will alert home health. [place]

## 2019-05-30 ENCOUNTER — Other Ambulatory Visit: Payer: Self-pay

## 2019-05-30 ENCOUNTER — Other Ambulatory Visit: Payer: Medicare Other

## 2019-05-30 DIAGNOSIS — I330 Acute and subacute infective endocarditis: Secondary | ICD-10-CM | POA: Diagnosis not present

## 2019-05-30 DIAGNOSIS — I10 Essential (primary) hypertension: Secondary | ICD-10-CM | POA: Diagnosis not present

## 2019-05-30 DIAGNOSIS — I5033 Acute on chronic diastolic (congestive) heart failure: Secondary | ICD-10-CM

## 2019-05-31 ENCOUNTER — Other Ambulatory Visit: Payer: Self-pay

## 2019-05-31 DIAGNOSIS — I5033 Acute on chronic diastolic (congestive) heart failure: Secondary | ICD-10-CM

## 2019-05-31 DIAGNOSIS — Z79899 Other long term (current) drug therapy: Secondary | ICD-10-CM

## 2019-05-31 LAB — BASIC METABOLIC PANEL
BUN/Creatinine Ratio: 18 (ref 12–28)
BUN: 44 mg/dL — ABNORMAL HIGH (ref 8–27)
CO2: 26 mmol/L (ref 20–29)
Calcium: 9 mg/dL (ref 8.7–10.3)
Chloride: 103 mmol/L (ref 96–106)
Creatinine, Ser: 2.38 mg/dL — ABNORMAL HIGH (ref 0.57–1.00)
GFR calc Af Amer: 23 mL/min/{1.73_m2} — ABNORMAL LOW (ref >59)
GFR calc non Af Amer: 20 mL/min/{1.73_m2} — ABNORMAL LOW (ref >59)
Glucose: 255 mg/dL — ABNORMAL HIGH (ref 65–99)
Potassium: 4 mmol/L (ref 3.5–5.2)
Sodium: 146 mmol/L — ABNORMAL HIGH (ref 134–144)

## 2019-05-31 LAB — CBC WITH DIFFERENTIAL/PLATELET
Basophils Absolute: 0.1 10*3/uL (ref 0.0–0.2)
Basos: 1 %
EOS (ABSOLUTE): 0.4 10*3/uL (ref 0.0–0.4)
Eos: 5 %
Hematocrit: 39 % (ref 34.0–46.6)
Hemoglobin: 12.5 g/dL (ref 11.1–15.9)
Immature Grans (Abs): 0 10*3/uL (ref 0.0–0.1)
Immature Granulocytes: 0 %
Lymphocytes Absolute: 1.8 10*3/uL (ref 0.7–3.1)
Lymphs: 27 %
MCH: 27.5 pg (ref 26.6–33.0)
MCHC: 32.1 g/dL (ref 31.5–35.7)
MCV: 86 fL (ref 79–97)
Monocytes Absolute: 0.5 10*3/uL (ref 0.1–0.9)
Monocytes: 8 %
Neutrophils Absolute: 3.9 10*3/uL (ref 1.4–7.0)
Neutrophils: 59 %
Platelets: 201 10*3/uL (ref 150–450)
RBC: 4.54 x10E6/uL (ref 3.77–5.28)
RDW: 13.8 % (ref 11.7–15.4)
WBC: 6.6 10*3/uL (ref 3.4–10.8)

## 2019-05-31 NOTE — Progress Notes (Signed)
Notes recorded by Minus Breeding, MD on 05/31/2019 at 12:44 PM EDT  Creat is mildly elevated but stable. I would like to have this repeated in one month. Call Ms. Hanline with the results and send results to [name], DO

## 2019-06-01 ENCOUNTER — Other Ambulatory Visit (HOSPITAL_COMMUNITY)
Admission: RE | Admit: 2019-06-01 | Discharge: 2019-06-01 | Disposition: A | Discharge status: Home / Self Care | Payer: Medicare Other | Source: Ambulatory Visit | Attending: Cardiovascular Disease | Admitting: Cardiovascular Disease

## 2019-06-01 DIAGNOSIS — Z20828 Contact with and (suspected) exposure to other viral communicable diseases: Secondary | ICD-10-CM | POA: Diagnosis not present

## 2019-06-01 DIAGNOSIS — Z01812 Encounter for preprocedural laboratory examination: Secondary | ICD-10-CM | POA: Diagnosis not present

## 2019-06-02 LAB — NOVEL CORONAVIRUS, NAA (HOSP ORDER, SEND-OUT TO REF LAB; TAT 18-24 HRS): SARS-CoV-2, NAA: NOT DETECTED

## 2019-06-03 ENCOUNTER — Telehealth: Payer: Self-pay | Admitting: Cardiology

## 2019-06-03 DIAGNOSIS — L97312 Non-pressure chronic ulcer of right ankle with fat layer exposed: Secondary | ICD-10-CM | POA: Diagnosis not present

## 2019-06-03 DIAGNOSIS — A4181 Sepsis due to Enterococcus: Secondary | ICD-10-CM | POA: Diagnosis not present

## 2019-06-03 DIAGNOSIS — T8203XD Leakage of heart valve prosthesis, subsequent encounter: Secondary | ICD-10-CM | POA: Diagnosis not present

## 2019-06-03 DIAGNOSIS — Z4800 Encounter for change or removal of nonsurgical wound dressing: Secondary | ICD-10-CM | POA: Diagnosis not present

## 2019-06-03 DIAGNOSIS — E1151 Type 2 diabetes mellitus with diabetic peripheral angiopathy without gangrene: Secondary | ICD-10-CM | POA: Diagnosis not present

## 2019-06-03 DIAGNOSIS — I872 Venous insufficiency (chronic) (peripheral): Secondary | ICD-10-CM | POA: Diagnosis not present

## 2019-06-03 NOTE — Telephone Encounter (Signed)
Patient has a PICC Line and has to do infusions of an antibiotic 3x a day. She is scheduled to have a TEE done on Wed 10/21. She does her infusion at 8 am, but that is when she is supposed to be at the hospital for her TEE. She wants to know if she should bring the medication with her for the hospital staff to administer, or if there are other instructions she should follow to administer the antibiotic. The patient called her Pharmacy and the Pre-admissions departments already and was told to ask her doctor for further instructions

## 2019-06-03 NOTE — Telephone Encounter (Signed)
Routed to MD to advise.

## 2019-06-03 NOTE — Telephone Encounter (Signed)
We can check with Endo and see if they will give it.  I doubt it.  She might need to change the timing a little for that day to get all three doses in at home.

## 2019-06-04 DIAGNOSIS — A4181 Sepsis due to Enterococcus: Secondary | ICD-10-CM | POA: Diagnosis not present

## 2019-06-04 DIAGNOSIS — E1151 Type 2 diabetes mellitus with diabetic peripheral angiopathy without gangrene: Secondary | ICD-10-CM | POA: Diagnosis not present

## 2019-06-04 DIAGNOSIS — Z4800 Encounter for change or removal of nonsurgical wound dressing: Secondary | ICD-10-CM | POA: Diagnosis not present

## 2019-06-04 DIAGNOSIS — I872 Venous insufficiency (chronic) (peripheral): Secondary | ICD-10-CM | POA: Diagnosis not present

## 2019-06-04 DIAGNOSIS — T8203XD Leakage of heart valve prosthesis, subsequent encounter: Secondary | ICD-10-CM | POA: Diagnosis not present

## 2019-06-04 DIAGNOSIS — L97312 Non-pressure chronic ulcer of right ankle with fat layer exposed: Secondary | ICD-10-CM | POA: Diagnosis not present

## 2019-06-04 NOTE — Telephone Encounter (Signed)
Spoke with Endo and can move patient to 12:30 arrive at 11:30 am. Advised patient, verbalized understanding.

## 2019-06-05 ENCOUNTER — Ambulatory Visit (HOSPITAL_BASED_OUTPATIENT_CLINIC_OR_DEPARTMENT_OTHER)
Admission: RE | Admit: 2019-06-05 | Discharge: 2019-06-05 | Disposition: A | Discharge status: Home / Self Care | Payer: Medicare Other | Source: Home / Self Care | Attending: Physician Assistant | Admitting: Physician Assistant

## 2019-06-05 ENCOUNTER — Ambulatory Visit (HOSPITAL_COMMUNITY)
Admission: RE | Admit: 2019-06-05 | Discharge: 2019-06-05 | Disposition: A | Discharge status: Home / Self Care | Payer: Medicare Other | Attending: Cardiovascular Disease | Admitting: Cardiovascular Disease

## 2019-06-05 ENCOUNTER — Ambulatory Visit (HOSPITAL_COMMUNITY): Payer: Medicare Other | Admitting: Anesthesiology

## 2019-06-05 ENCOUNTER — Encounter (HOSPITAL_COMMUNITY)
Admission: RE | Disposition: A | Discharge status: Home / Self Care | Payer: Self-pay | Source: Home / Self Care | Attending: Cardiovascular Disease

## 2019-06-05 ENCOUNTER — Other Ambulatory Visit: Payer: Self-pay

## 2019-06-05 ENCOUNTER — Encounter (HOSPITAL_COMMUNITY): Payer: Self-pay | Admitting: *Deleted

## 2019-06-05 DIAGNOSIS — I70235 Atherosclerosis of native arteries of right leg with ulceration of other part of foot: Secondary | ICD-10-CM | POA: Insufficient documentation

## 2019-06-05 DIAGNOSIS — I340 Nonrheumatic mitral (valve) insufficiency: Secondary | ICD-10-CM

## 2019-06-05 DIAGNOSIS — E1122 Type 2 diabetes mellitus with diabetic chronic kidney disease: Secondary | ICD-10-CM | POA: Insufficient documentation

## 2019-06-05 DIAGNOSIS — L97519 Non-pressure chronic ulcer of other part of right foot with unspecified severity: Secondary | ICD-10-CM | POA: Insufficient documentation

## 2019-06-05 DIAGNOSIS — I420 Dilated cardiomyopathy: Secondary | ICD-10-CM | POA: Diagnosis not present

## 2019-06-05 DIAGNOSIS — Z794 Long term (current) use of insulin: Secondary | ICD-10-CM | POA: Insufficient documentation

## 2019-06-05 DIAGNOSIS — Z9884 Bariatric surgery status: Secondary | ICD-10-CM | POA: Insufficient documentation

## 2019-06-05 DIAGNOSIS — G4730 Sleep apnea, unspecified: Secondary | ICD-10-CM | POA: Diagnosis not present

## 2019-06-05 DIAGNOSIS — K219 Gastro-esophageal reflux disease without esophagitis: Secondary | ICD-10-CM | POA: Insufficient documentation

## 2019-06-05 DIAGNOSIS — I2510 Atherosclerotic heart disease of native coronary artery without angina pectoris: Secondary | ICD-10-CM | POA: Diagnosis not present

## 2019-06-05 DIAGNOSIS — Z7901 Long term (current) use of anticoagulants: Secondary | ICD-10-CM | POA: Diagnosis not present

## 2019-06-05 DIAGNOSIS — N183 Chronic kidney disease, stage 3 unspecified: Secondary | ICD-10-CM | POA: Insufficient documentation

## 2019-06-05 DIAGNOSIS — E785 Hyperlipidemia, unspecified: Secondary | ICD-10-CM | POA: Diagnosis not present

## 2019-06-05 DIAGNOSIS — Z79899 Other long term (current) drug therapy: Secondary | ICD-10-CM | POA: Diagnosis not present

## 2019-06-05 DIAGNOSIS — Z888 Allergy status to other drugs, medicaments and biological substances status: Secondary | ICD-10-CM | POA: Insufficient documentation

## 2019-06-05 DIAGNOSIS — I38 Endocarditis, valve unspecified: Secondary | ICD-10-CM | POA: Diagnosis not present

## 2019-06-05 DIAGNOSIS — I70245 Atherosclerosis of native arteries of left leg with ulceration of other part of foot: Secondary | ICD-10-CM | POA: Insufficient documentation

## 2019-06-05 DIAGNOSIS — K589 Irritable bowel syndrome without diarrhea: Secondary | ICD-10-CM | POA: Diagnosis not present

## 2019-06-05 DIAGNOSIS — I5033 Acute on chronic diastolic (congestive) heart failure: Secondary | ICD-10-CM | POA: Insufficient documentation

## 2019-06-05 DIAGNOSIS — I130 Hypertensive heart and chronic kidney disease with heart failure and stage 1 through stage 4 chronic kidney disease, or unspecified chronic kidney disease: Secondary | ICD-10-CM | POA: Diagnosis not present

## 2019-06-05 DIAGNOSIS — E1151 Type 2 diabetes mellitus with diabetic peripheral angiopathy without gangrene: Secondary | ICD-10-CM | POA: Insufficient documentation

## 2019-06-05 DIAGNOSIS — Z89412 Acquired absence of left great toe: Secondary | ICD-10-CM | POA: Insufficient documentation

## 2019-06-05 DIAGNOSIS — Z6831 Body mass index (BMI) 31.0-31.9, adult: Secondary | ICD-10-CM | POA: Insufficient documentation

## 2019-06-05 DIAGNOSIS — I080 Rheumatic disorders of both mitral and aortic valves: Secondary | ICD-10-CM | POA: Diagnosis not present

## 2019-06-05 DIAGNOSIS — M810 Age-related osteoporosis without current pathological fracture: Secondary | ICD-10-CM | POA: Diagnosis not present

## 2019-06-05 DIAGNOSIS — Z886 Allergy status to analgesic agent status: Secondary | ICD-10-CM | POA: Insufficient documentation

## 2019-06-05 DIAGNOSIS — G459 Transient cerebral ischemic attack, unspecified: Secondary | ICD-10-CM | POA: Diagnosis not present

## 2019-06-05 DIAGNOSIS — I058 Other rheumatic mitral valve diseases: Secondary | ICD-10-CM

## 2019-06-05 DIAGNOSIS — Z8679 Personal history of other diseases of the circulatory system: Secondary | ICD-10-CM | POA: Insufficient documentation

## 2019-06-05 DIAGNOSIS — Z8673 Personal history of transient ischemic attack (TIA), and cerebral infarction without residual deficits: Secondary | ICD-10-CM | POA: Insufficient documentation

## 2019-06-05 DIAGNOSIS — L97529 Non-pressure chronic ulcer of other part of left foot with unspecified severity: Secondary | ICD-10-CM | POA: Diagnosis not present

## 2019-06-05 DIAGNOSIS — I351 Nonrheumatic aortic (valve) insufficiency: Secondary | ICD-10-CM | POA: Diagnosis not present

## 2019-06-05 DIAGNOSIS — I313 Pericardial effusion (noninflammatory): Secondary | ICD-10-CM | POA: Diagnosis not present

## 2019-06-05 DIAGNOSIS — E11621 Type 2 diabetes mellitus with foot ulcer: Secondary | ICD-10-CM | POA: Diagnosis not present

## 2019-06-05 DIAGNOSIS — Z952 Presence of prosthetic heart valve: Secondary | ICD-10-CM

## 2019-06-05 DIAGNOSIS — Z953 Presence of xenogenic heart valve: Secondary | ICD-10-CM | POA: Insufficient documentation

## 2019-06-05 DIAGNOSIS — I059 Rheumatic mitral valve disease, unspecified: Secondary | ICD-10-CM

## 2019-06-05 DIAGNOSIS — I4891 Unspecified atrial fibrillation: Secondary | ICD-10-CM | POA: Insufficient documentation

## 2019-06-05 DIAGNOSIS — B952 Enterococcus as the cause of diseases classified elsewhere: Secondary | ICD-10-CM

## 2019-06-05 HISTORY — PX: TEE WITHOUT CARDIOVERSION: SHX5443

## 2019-06-05 SURGERY — ECHOCARDIOGRAM, TRANSESOPHAGEAL
Anesthesia: Monitor Anesthesia Care

## 2019-06-05 MED ORDER — BUTAMBEN-TETRACAINE-BENZOCAINE 2-2-14 % EX AERO
INHALATION_SPRAY | CUTANEOUS | Status: DC | PRN
  Administered 2019-06-05: 2 via TOPICAL

## 2019-06-05 MED ORDER — SODIUM CHLORIDE 0.9 % IV SOLN
INTRAVENOUS | Status: DC
  Administered 2019-06-05: 13:00:00 via INTRAVENOUS

## 2019-06-05 MED ORDER — HEPARIN SOD (PORK) LOCK FLUSH 100 UNIT/ML IV SOLN
250.0000 [IU] | INTRAVENOUS | Status: AC | PRN
  Administered 2019-06-05: 250 [IU]
  Filled 2019-06-05: qty 3

## 2019-06-05 MED ORDER — ONDANSETRON HCL 4 MG/2ML IJ SOLN
INTRAMUSCULAR | Status: DC | PRN
  Administered 2019-06-05: 4 mg via INTRAVENOUS

## 2019-06-05 MED ORDER — PROPOFOL 500 MG/50ML IV EMUL
INTRAVENOUS | Status: DC | PRN
  Administered 2019-06-05: 75 ug/kg/min via INTRAVENOUS

## 2019-06-05 MED ORDER — LIDOCAINE 2% (20 MG/ML) 5 ML SYRINGE
INTRAMUSCULAR | Status: DC | PRN
  Administered 2019-06-05: 100 mg via INTRAVENOUS

## 2019-06-05 NOTE — Anesthesia Preprocedure Evaluation (Signed)
Anesthesia Evaluation  Patient identified by MRN, date of birth, ID band Patient awake    Reviewed: Allergy & Precautions, NPO status , Patient's Chart, lab work & pertinent test results  History of Anesthesia Complications (+) DIFFICULT AIRWAY  Airway Mallampati: IV  TM Distance: <3 FB Neck ROM: Full    Dental no notable dental hx. (+) Teeth Intact   Pulmonary sleep apnea ,    Pulmonary exam normal breath sounds clear to auscultation       Cardiovascular Exercise Tolerance: Good hypertension, + CAD, + Peripheral Vascular Disease and +CHF  Normal cardiovascular exam Rhythm:Regular Rate:Normal     Neuro/Psych TIACVA negative psych ROS   GI/Hepatic Neg liver ROS, hiatal hernia, GERD  ,  Endo/Other  diabetes  Renal/GU Renal disease     Musculoskeletal   Abdominal   Peds  Hematology negative hematology ROS (+)   Anesthesia Other Findings   Reproductive/Obstetrics                             Anesthesia Physical  Anesthesia Plan  ASA: III  Anesthesia Plan: MAC   Post-op Pain Management:    Induction: Intravenous  PONV Risk Score and Plan: Treatment may vary due to age or medical condition, Ondansetron and Dexamethasone  Airway Management Planned: Nasal Cannula and Natural Airway  Additional Equipment: None  Intra-op Plan:   Post-operative Plan:   Informed Consent: I have reviewed the patients History and Physical, chart, labs and discussed the procedure including the risks, benefits and alternatives for the proposed anesthesia with the patient or authorized representative who has indicated his/her understanding and acceptance.     Dental advisory given  Plan Discussed with: CRNA  Anesthesia Plan Comments:         Anesthesia Quick Evaluation

## 2019-06-05 NOTE — Progress Notes (Signed)
Site assessment: R chest tunneled IJ with brisk blood return. Cap changed, flushed. Antimicrobial disc in place, pink at suture sites.

## 2019-06-05 NOTE — Progress Notes (Signed)
Echocardiogram Echocardiogram Transesophageal has been performed.  Oneal Deputy Becky Colan 06/05/2019, 1:22 PM

## 2019-06-05 NOTE — Anesthesia Postprocedure Evaluation (Signed)
Anesthesia Post Note  Patient: [name]  Procedure(s) Performed: TRANSESOPHAGEAL ECHOCARDIOGRAM (TEE) (N/A )     Patient location during evaluation: PACU Anesthesia Type: MAC Level of consciousness: awake and alert Pain management: pain level controlled Vital Signs Assessment: post-procedure vital signs reviewed and stable Respiratory status: spontaneous breathing Cardiovascular status: stable Anesthetic complications: no    Last Vitals:  Vitals:   06/05/19 1324 06/05/19 1334  BP: (!) 177/84 (!) 177/67  Pulse: 74 75  Resp: 15 20  Temp:    SpO2: 94% 93%    Last Pain:  Vitals:   06/05/19 1314  TempSrc: Oral  PainSc: 0-No pain                 Nolon Nations

## 2019-06-05 NOTE — Interval H&P Note (Signed)
History and Physical Interval Note:  06/05/2019 12:37 PM  [name]  has presented today for surgery, with the diagnosis of ENDOCARDITIS.  The various methods of treatment have been discussed with the patient and family. After consideration of risks, benefits and other options for treatment, the patient has consented to  Procedure(s): TRANSESOPHAGEAL ECHOCARDIOGRAM (TEE) (N/A) as a surgical intervention.  The patient's history has been reviewed, patient examined, no change in status, stable for surgery.  I have reviewed the patient's chart and labs.  Questions were answered to the patient's satisfaction.     [name]

## 2019-06-05 NOTE — Anesthesia Procedure Notes (Signed)
Procedure Name: MAC Date/Time: 06/05/2019 1:00 PM Performed by: Teressa Lower., CRNA Pre-anesthesia Checklist: Patient identified, Emergency Drugs available, Suction available, Patient being monitored and Timeout performed Patient Re-evaluated:Patient Re-evaluated prior to induction Oxygen Delivery Method: Simple face mask

## 2019-06-05 NOTE — Op Note (Signed)
INDICATIONS: endocarditis  PROCEDURE:   Informed consent was obtained prior to the procedure. The risks, benefits and alternatives for the procedure were discussed and the patient comprehended these risks.  Risks include, but are not limited to, cough, sore throat, vomiting, nausea, somnolence, esophageal and stomach trauma or perforation, bleeding, low blood pressure, aspiration, pneumonia, infection, trauma to the teeth and death.    After a procedural time-out, the oropharynx was anesthetized with 20% benzocaine spray.   During this procedure the patient was administered iv Propofol by Anesthesiology.  The transesophageal probe was inserted in the esophagus and stomach without difficulty and multiple views were obtained.  The patient was kept under observation until the patient left the procedure room.  The patient left the procedure room in stable condition.   Agitated microbubble saline contrast was not administered.  COMPLICATIONS:    There were no immediate complications.  FINDINGS:  The mitral annulus has the same configuration as on the previous study one month earlier, with a large, slightly mobile posterior calcification. The TAVR prosthesis appears normal with 2 small perivalvular jets, similar or milder compared to September 2020. No vegetations are seen. Normal LVEF. LVH is present. Moderate atherosclerotic disease in the descending aorta.  RECOMMENDATIONS:    No evidence of active endocarditis. Findings are unchanged or improved from the previous TEE.  Time Spent Directly with the Patient:  30 minutes   Collis Thede 06/05/2019, 1:08 PM

## 2019-06-05 NOTE — Discharge Instructions (Signed)

## 2019-06-05 NOTE — Transfer of Care (Signed)
Immediate Anesthesia Transfer of Care Note  Patient: [name]  Procedure(s) Performed: TRANSESOPHAGEAL ECHOCARDIOGRAM (TEE) (N/A )  Patient Location: Endoscopy Unit  Anesthesia Type:MAC  Level of Consciousness: awake, alert  and oriented  Airway & Oxygen Therapy: Patient Spontanous Breathing  Post-op Assessment: Report given to RN and Post -op Vital signs reviewed and stable  Post vital signs: Reviewed and stable  Last Vitals:  Vitals Value Taken Time  BP    Temp    Pulse 73 06/05/19 1314  Resp    SpO2 100 % 06/05/19 1314  Vitals shown include unvalidated device data.  Last Pain:  Vitals:   06/05/19 1146  TempSrc: Temporal  PainSc: 0-No pain         Complications: No apparent anesthesia complications

## 2019-06-06 ENCOUNTER — Telehealth: Payer: Self-pay | Admitting: Cardiology

## 2019-06-06 NOTE — Telephone Encounter (Signed)
New Message     Pt is calling to follow up with Dr Percival Spanish about the TEE    Please call

## 2019-06-06 NOTE — Telephone Encounter (Signed)
Spoke with patient and she is wanting to know what Dr Hochrein's recommendations are following TEE Will forward for review

## 2019-06-06 NOTE — Telephone Encounter (Signed)
There was no evidence of ongoing infection.  She should complete the antibiotics and follow up with ID.  No change in therapy.

## 2019-06-07 ENCOUNTER — Encounter (HOSPITAL_COMMUNITY): Payer: Self-pay | Admitting: Cardiovascular Disease

## 2019-06-07 NOTE — Telephone Encounter (Signed)
Gave pt Dr. Rosezella Florida recommendations. Verbalized understanding.

## 2019-06-10 ENCOUNTER — Telehealth: Payer: Self-pay | Admitting: *Deleted

## 2019-06-10 DIAGNOSIS — Z4800 Encounter for change or removal of nonsurgical wound dressing: Secondary | ICD-10-CM | POA: Diagnosis not present

## 2019-06-10 DIAGNOSIS — A4181 Sepsis due to Enterococcus: Secondary | ICD-10-CM | POA: Diagnosis not present

## 2019-06-10 DIAGNOSIS — T8203XD Leakage of heart valve prosthesis, subsequent encounter: Secondary | ICD-10-CM | POA: Diagnosis not present

## 2019-06-10 DIAGNOSIS — E1151 Type 2 diabetes mellitus with diabetic peripheral angiopathy without gangrene: Secondary | ICD-10-CM | POA: Diagnosis not present

## 2019-06-10 DIAGNOSIS — I872 Venous insufficiency (chronic) (peripheral): Secondary | ICD-10-CM | POA: Diagnosis not present

## 2019-06-10 DIAGNOSIS — L97312 Non-pressure chronic ulcer of right ankle with fat layer exposed: Secondary | ICD-10-CM | POA: Diagnosis not present

## 2019-06-10 NOTE — Telephone Encounter (Signed)
Yes, her picc can be removed

## 2019-06-10 NOTE — Telephone Encounter (Signed)
Patient called to report that she finished her IV medication 06/08/19 and she wants to know when her PICC will be removed. Advised her will have to ask the provider and give her a call back.

## 2019-06-11 DIAGNOSIS — I872 Venous insufficiency (chronic) (peripheral): Secondary | ICD-10-CM | POA: Diagnosis not present

## 2019-06-11 DIAGNOSIS — Z4800 Encounter for change or removal of nonsurgical wound dressing: Secondary | ICD-10-CM | POA: Diagnosis not present

## 2019-06-11 DIAGNOSIS — L97312 Non-pressure chronic ulcer of right ankle with fat layer exposed: Secondary | ICD-10-CM | POA: Diagnosis not present

## 2019-06-11 DIAGNOSIS — A4181 Sepsis due to Enterococcus: Secondary | ICD-10-CM | POA: Diagnosis not present

## 2019-06-11 DIAGNOSIS — E1151 Type 2 diabetes mellitus with diabetic peripheral angiopathy without gangrene: Secondary | ICD-10-CM | POA: Diagnosis not present

## 2019-06-11 DIAGNOSIS — T8203XD Leakage of heart valve prosthesis, subsequent encounter: Secondary | ICD-10-CM | POA: Diagnosis not present

## 2019-06-12 NOTE — Telephone Encounter (Signed)
Patient had a tunneled PICC and needs to have it removed at the hospital. I will call IR to have the appointment set up.

## 2019-06-12 NOTE — Telephone Encounter (Signed)
I do not know how to place an order like that. Can you please call IR and give them a verbal to d/c her line?

## 2019-06-12 NOTE — Telephone Encounter (Signed)
Per Dr Prince Rome called [facility] to give order to D/C PICC. Order given to [facility] and she advised she will give to nursing.

## 2019-06-13 ENCOUNTER — Other Ambulatory Visit: Payer: Self-pay | Admitting: Infectious Diseases

## 2019-06-13 DIAGNOSIS — I330 Acute and subacute infective endocarditis: Secondary | ICD-10-CM

## 2019-06-13 NOTE — Telephone Encounter (Signed)
Order placed. I hope it's what IR will accept.

## 2019-06-14 ENCOUNTER — Ambulatory Visit (HOSPITAL_COMMUNITY)
Admission: RE | Admit: 2019-06-14 | Discharge: 2019-06-14 | Disposition: A | Discharge status: Home / Self Care | Payer: Medicare Other | Source: Ambulatory Visit | Attending: Cardiology | Admitting: Cardiology

## 2019-06-14 ENCOUNTER — Encounter (HOSPITAL_COMMUNITY): Payer: Self-pay | Admitting: Cardiology

## 2019-06-14 ENCOUNTER — Other Ambulatory Visit (HOSPITAL_COMMUNITY): Payer: Medicare Other

## 2019-06-14 ENCOUNTER — Other Ambulatory Visit: Payer: Self-pay

## 2019-06-14 VITALS — BP 126/60 | HR 70 | Wt 204.4 lb

## 2019-06-14 DIAGNOSIS — Z6832 Body mass index (BMI) 32.0-32.9, adult: Secondary | ICD-10-CM | POA: Insufficient documentation

## 2019-06-14 DIAGNOSIS — R42 Dizziness and giddiness: Secondary | ICD-10-CM | POA: Insufficient documentation

## 2019-06-14 DIAGNOSIS — Z7901 Long term (current) use of anticoagulants: Secondary | ICD-10-CM | POA: Insufficient documentation

## 2019-06-14 DIAGNOSIS — E785 Hyperlipidemia, unspecified: Secondary | ICD-10-CM | POA: Diagnosis not present

## 2019-06-14 DIAGNOSIS — E11621 Type 2 diabetes mellitus with foot ulcer: Secondary | ICD-10-CM | POA: Diagnosis not present

## 2019-06-14 DIAGNOSIS — I2510 Atherosclerotic heart disease of native coronary artery without angina pectoris: Secondary | ICD-10-CM | POA: Insufficient documentation

## 2019-06-14 DIAGNOSIS — Z9884 Bariatric surgery status: Secondary | ICD-10-CM | POA: Insufficient documentation

## 2019-06-14 DIAGNOSIS — E1122 Type 2 diabetes mellitus with diabetic chronic kidney disease: Secondary | ICD-10-CM | POA: Insufficient documentation

## 2019-06-14 DIAGNOSIS — Z808 Family history of malignant neoplasm of other organs or systems: Secondary | ICD-10-CM | POA: Insufficient documentation

## 2019-06-14 DIAGNOSIS — Z794 Long term (current) use of insulin: Secondary | ICD-10-CM | POA: Insufficient documentation

## 2019-06-14 DIAGNOSIS — Z841 Family history of disorders of kidney and ureter: Secondary | ICD-10-CM | POA: Insufficient documentation

## 2019-06-14 DIAGNOSIS — I491 Atrial premature depolarization: Secondary | ICD-10-CM | POA: Insufficient documentation

## 2019-06-14 DIAGNOSIS — E854 Organ-limited amyloidosis: Secondary | ICD-10-CM | POA: Diagnosis not present

## 2019-06-14 DIAGNOSIS — Z8249 Family history of ischemic heart disease and other diseases of the circulatory system: Secondary | ICD-10-CM | POA: Insufficient documentation

## 2019-06-14 DIAGNOSIS — Z833 Family history of diabetes mellitus: Secondary | ICD-10-CM | POA: Insufficient documentation

## 2019-06-14 DIAGNOSIS — I480 Paroxysmal atrial fibrillation: Secondary | ICD-10-CM | POA: Diagnosis not present

## 2019-06-14 DIAGNOSIS — Z952 Presence of prosthetic heart valve: Secondary | ICD-10-CM | POA: Insufficient documentation

## 2019-06-14 DIAGNOSIS — I5022 Chronic systolic (congestive) heart failure: Secondary | ICD-10-CM

## 2019-06-14 DIAGNOSIS — E1151 Type 2 diabetes mellitus with diabetic peripheral angiopathy without gangrene: Secondary | ICD-10-CM | POA: Insufficient documentation

## 2019-06-14 DIAGNOSIS — Z886 Allergy status to analgesic agent status: Secondary | ICD-10-CM | POA: Insufficient documentation

## 2019-06-14 DIAGNOSIS — I130 Hypertensive heart and chronic kidney disease with heart failure and stage 1 through stage 4 chronic kidney disease, or unspecified chronic kidney disease: Secondary | ICD-10-CM | POA: Insufficient documentation

## 2019-06-14 DIAGNOSIS — G4733 Obstructive sleep apnea (adult) (pediatric): Secondary | ICD-10-CM | POA: Diagnosis not present

## 2019-06-14 DIAGNOSIS — Z79899 Other long term (current) drug therapy: Secondary | ICD-10-CM | POA: Insufficient documentation

## 2019-06-14 DIAGNOSIS — N183 Chronic kidney disease, stage 3 unspecified: Secondary | ICD-10-CM | POA: Diagnosis not present

## 2019-06-14 DIAGNOSIS — I059 Rheumatic mitral valve disease, unspecified: Secondary | ICD-10-CM | POA: Diagnosis not present

## 2019-06-14 DIAGNOSIS — I5032 Chronic diastolic (congestive) heart failure: Secondary | ICD-10-CM | POA: Diagnosis not present

## 2019-06-14 DIAGNOSIS — L97419 Non-pressure chronic ulcer of right heel and midfoot with unspecified severity: Secondary | ICD-10-CM | POA: Insufficient documentation

## 2019-06-14 DIAGNOSIS — R9431 Abnormal electrocardiogram [ECG] [EKG]: Secondary | ICD-10-CM | POA: Insufficient documentation

## 2019-06-14 DIAGNOSIS — I43 Cardiomyopathy in diseases classified elsewhere: Secondary | ICD-10-CM | POA: Insufficient documentation

## 2019-06-14 DIAGNOSIS — Z823 Family history of stroke: Secondary | ICD-10-CM | POA: Insufficient documentation

## 2019-06-14 DIAGNOSIS — Z888 Allergy status to other drugs, medicaments and biological substances status: Secondary | ICD-10-CM | POA: Insufficient documentation

## 2019-06-14 LAB — BASIC METABOLIC PANEL
Anion gap: 13 (ref 5–15)
BUN: 52 mg/dL — ABNORMAL HIGH (ref 8–23)
CO2: 27 mmol/L (ref 22–32)
Calcium: 9.7 mg/dL (ref 8.9–10.3)
Chloride: 100 mmol/L (ref 98–111)
Creatinine, Ser: 2.68 mg/dL — ABNORMAL HIGH (ref 0.44–1.00)
GFR calc Af Amer: 20 mL/min — ABNORMAL LOW (ref >60)
GFR calc non Af Amer: 18 mL/min — ABNORMAL LOW (ref >60)
Glucose, Bld: 252 mg/dL — ABNORMAL HIGH (ref 70–99)
Potassium: 3.7 mmol/L (ref 3.5–5.1)
Sodium: 140 mmol/L (ref 135–145)

## 2019-06-14 LAB — LIPID PANEL
Cholesterol: 145 mg/dL (ref 0–200)
HDL: 47 mg/dL (ref >40)
LDL Cholesterol: 26 mg/dL (ref 0–99)
Total CHOL/HDL Ratio: 3.1 RATIO
Triglycerides: 358 mg/dL — ABNORMAL HIGH (ref <150)
VLDL: 72 mg/dL — ABNORMAL HIGH (ref 0–40)

## 2019-06-14 MED ORDER — MECLIZINE HCL 12.5 MG PO TABS: 12.5000 mg | ORAL_TABLET | Freq: Three times a day (TID) | ORAL | 5 refills | Status: AC | PRN

## 2019-06-14 NOTE — Patient Instructions (Addendum)
Labs done today. We will contact you only if your labs are abnormal.   Meclizine refilled. Please continue all medications as prescribed.  Your physician recommends that you schedule a follow-up appointment in: 2 months  You have been referred to interventional radiology to have your catheter removed.(order placed by Dr.Powers office) Interventional radiology will contact you to schedule an appointment.   At the [facility], you and your health needs are our priority. As part of our continuing mission to provide you with exceptional heart care, we have created designated Provider Care Teams. These Care Teams include your primary Cardiologist (physician) and Advanced Practice Providers (APPs- Physician Assistants and Nurse Practitioners) who all work together to provide you with the care you need, when you need it.   You may see any of the following providers on your designated Care Team at your next follow up: Marland Kitchen Dr Glori Bickers . Dr [name] . Darrick Grinder, NP . Lyda Jester, PA   Please be sure to bring in all your medications bottles to every appointment.

## 2019-06-16 NOTE — Progress Notes (Addendum)
Date:  06/16/2019   ID:  [name], DOB 1951/03/29, MRN [id number]    Provider location: [facility] Type of Visit: Established patient   PCP:  [name], DO  Cardiologist:  Minus Breeding, MD Primary HF: Dr. Aundra Dubin  Chief Complaint: Shortness of breath   History of Present Illness: [name] is a 68 y.o. female who has a history of AS s/p TAVR, chronic diastolic CHF, PAF, CKD III, HLD, HTN, OSA, and morbid obesity.   She was admitted from [name] office 09/19/18 with marked volume overload. She diuresed 36 lbs with lasix drip and was transitioned to torsemide 40 mg daily. Course complicated by AKI. RHC completed and showed elevated filling pressures and preserved cardiac output.  PYP scan suggestive of TTR amyloid. Discharge weight was 208 pounds.   She developed a non-healing right heal ulcer followed by a podiatrist in [place].  She ended up having a peripheral angiogram in [place] with PTCA to the right popliteal and right AT.    In 9/20, she was admitted with fever and found to have Enterococcal bacteremia.  TEE was concerning for possible MV vegetation, bioprosthetic aortic valve looked ok.  She was treated with IV antibiotics, and repeat TEE in 10/20 did not show residual active endocarditis.   She returns today for followup of CHF, PAF, aortic valve disease.  Breathing is stable, she gets short of breath walking more than about 100 feet at a time. She is getting home PT.  Generally does well around her house.  She has had vertigo-type symptoms recently, spinning sensation when she moves her head in certain ways, often noted when she lies down.  No BRBPR/melena.   No significant claudication.   ECG (personally reviewed): NSR, PACs, poor RWP  Labs (11/19): LDL 115 Labs (2/20): K 4.4, creatinine 2.39, myeloma panel negative Labs (7/20): LDL 150, triglycerides 422, K 4.1, creatinine 2.97 => 2.06  PMH: 1. Chronic diastolic  CHF: Echo (1/76) with EF 60-65%, moderate LVH, mildly dilated RV with mildly decreased systolic function, bioprosthetic aortic valve s/p TAVR appeared to function normally.  - RHC (2/20): mean RA 13, PA 50/21 mean 33, PCWP mean 25, CI 2.92, PVR 1.3 WU  - TEE (10/20): EF 60-65%, mild LVH, mild MR, s/p TAVR with no stenosis and mild peri-valvular leakage, no active endocarditis.  2. Cardiac amyloidosis: TTR amyloidosis.   - PYP scan (2/20): grade 2, H/CL ratio 1.8 => suggestive of TTR amyloidosis.  - myeloma panel negative.  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  3. Bioprosthetic aortic valve s/p TAVR.  4. CKD: Stage 3.  5. Atrial fibrillation: Paroxysmal.  6. Hyperlipidemia: unable to tolerate Crestor or Vascepa.  7. GERD with hiatal hernia 8. HTN 9. Obesity s/p gastric bypass in 2015.  10. Type 2 diabetes 11. OSA: Unable to tolerate CPAP.  12. CAD: LHC (8/19) with 50% proximal RCA, 50% proximal LCx, 65% mid LCx, 99% distal LAD (medical management).  13. Carotid dopplers (8/19) with mild disease.  14. PAD: Peripheral arterial angiography in 7/20 with PTCA right popliteal artery and right AT.  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    Current Outpatient Medications  Medication Sig Dispense Refill  . ACCU-CHEK AVIVA PLUS test strip USE TO CHECK BLOOD SUGAR UP TO TWICE DAILY OR AS INSTRUCTED 100 each 2  . Alirocumab (PRALUENT) 75 MG/ML SOAJ Inject 75 mg into the skin every 14 (fourteen) days. 2 pen  6  . ampicillin (OMNIPEN) 2 g injection Inject 2 g into the vein 3 (three) times daily. 8 am, 15:00 and 2200    . B-D INS SYR ULTRAFINE 1CC/31G 31G X 5/16" 1 ML MISC USE TO INJECT INSULIN TWICE A DAY AS INSTRUCTED (Patient taking differently: 1 Stick by Other route 3 (three) times daily before meals. ) 100 each 2  . Calcium Carbonate-Vitamin D (CALCIUM 600+D) 600-400 MG-UNIT per tablet Take 1 tablet by mouth 2 (two) times daily. (Patient taking differently:  Take 1 tablet by mouth daily. )    . carvedilol (COREG) 25 MG tablet TAKE 1 TABLET BY MOUTH TWICE A DAY (Patient taking differently: Take 25 mg by mouth 2 (two) times daily with a meal. ) 180 tablet 3  . Cholecalciferol (VITAMIN D3) 50 MCG (2000 UT) TABS Take 2,000 Units by mouth daily.    Marland Kitchen ELIQUIS 5 MG TABS tablet TAKE 1 TABLET BY MOUTH TWICE A DAY (Patient taking differently: Take 5 mg by mouth 2 (two) times daily. ) 180 tablet 0  . fenofibrate (TRICOR) 48 MG tablet Take 1 tablet (48 mg total) by mouth daily. 90 tablet 3  . heparin lock flush 100 UNIT/ML SOLN injection 2.5 mLs (250 Units total) by Intracatheter route as needed (line care). (Patient taking differently: 250 Units by Intracatheter route as needed (line care). After IV) 2.5 mL 0  . hydrALAZINE (APRESOLINE) 10 MG tablet Take 1 tablet (10 mg total) by mouth every 6 (six) hours. (Patient taking differently: Take 10 mg by mouth 3 (three) times daily. ) 360 tablet 0  . insulin aspart protamine- aspart (NOVOLOG MIX 70/30) (70-30) 100 UNIT/ML injection Inject 60-75 Units into the skin See admin instructions. Inject 75 units in the morning and 60 units at lunch and 60 units at supper    . Insulin Pen Needle 32G X 4 MM MISC Use to inject insulin with insulin pen 100 each 4  . LIVALO 4 MG TABS TAKE 1 TABLET BY MOUTH EVERY DAY (Patient taking differently: Take 4 mg by mouth every evening. ) 90 tablet 1  . meclizine (ANTIVERT) 12.5 MG tablet Take 1 tablet (12.5 mg total) by mouth 3 (three) times daily as needed for dizziness. 30 tablet 5  . Multiple Vitamins-Minerals (MULTIVITAMIN GUMMIES ADULT PO) Take 2 tablets by mouth daily.    . nutrition supplement, JUVEN, (JUVEN) PACK Take 1 packet by mouth 2 (two) times daily between meals. 60 packet 0  . ondansetron (ZOFRAN ODT) 4 MG disintegrating tablet Take 1 tablet (4 mg total) by mouth every 8 (eight) hours as needed for nausea. 10 tablet 0  . Prenatal Vit-Fe Fumarate-FA (PRENATAL MULTIVITAMIN) TABS  tablet Take 1 tablet by mouth 3 (three) times a week.    Marland Kitchen Propylene Glycol (SYSTANE COMPLETE) 0.6 % SOLN Apply 1 drop to eye daily as needed (dry eyes).     . Sodium Chloride Flush (SALINE FLUSH) 0.9 % SOLN Inject 1 each into the vein See admin instructions. Before and after IV for a total of 6 times a day    . Tafamidis (VYNDAMAX) 61 MG CAPS Take by mouth.    . torsemide (DEMADEX) 20 MG tablet TAKE 1.5 TABLETS (30 MG TOTAL) BY MOUTH EVERY MORNING AND 1 TABLET (20 MG TOTAL) AT BEDTIME. (Patient taking differently: Take 30 mg by mouth daily. Take 30 mg in the morning and 20 mg in the evening) 225 tablet 0   No current facility-administered medications for this encounter.  Allergies:   Amlodipine, Atorvastatin, Crestor [rosuvastatin calcium], Ezetimibe-simvastatin, Statins, Celebrex [celecoxib], and Levemir [insulin detemir]   Social History:  The patient  reports that she has never smoked. She has never used smokeless tobacco. She reports current alcohol use. She reports that she does not use drugs.   Family History:  The patient's family history includes Colitis in her mother; Colon polyps in her father; Diabetes in her father, maternal grandfather, mother, and another family member; Heart attack in her sister; Heart disease in her father; Heart failure in her father and mother; Hypertension in her mother; Kidney Stones in her mother; Skin cancer in her mother; Stroke in her maternal grandfather and mother.   ROS:  Please see the history of present illness.   All other systems are personally reviewed and negative.   Exam:   BP 126/60   Pulse 70   Wt 92.7 kg (204 lb 6.4 oz)   SpO2 96%   BMI 32.01 kg/m  General: NAD Neck: Thick, no JVD, no thyromegaly or thyroid nodule.  Lungs: Clear to auscultation bilaterally with normal respiratory effort. CV: Nondisplaced PMI.  Heart regular S1/S2, no S3/S4, 1/6 SEM RUSB.  No peripheral edema.  No carotid bruit.  Unable to palpate pedal pulses.   Abdomen: Soft, nontender, no hepatosplenomegaly, no distention.  Skin: Intact without lesions or rashes.  Neurologic: Alert and oriented x 3.  Psych: Normal affect. Extremities: No clubbing or cyanosis.  HEENT: Normal.    Recent Labs: 09/05/2018: BNP 116.6 04/16/2019: ALT 17 04/23/2019: Magnesium 2.0 05/30/2019: Hemoglobin 12.5; Platelets 201 06/14/2019: BUN 52; Creatinine, Ser 2.68; Potassium 3.7; Sodium 140  Personally reviewed   Wt Readings from Last 3 Encounters:  06/14/19 92.7 kg (204 lb 6.4 oz)  06/05/19 90.7 kg (200 lb)  05/22/19 91.2 kg (201 lb)      ASSESSMENT AND PLAN:  1. Chronic diastolic CHF: TEE in 41/96 showed EF 60-65%, mild LVH, stable bioprosthetic aortic valve s/p TAVR.PYP scan in 2/20 suggestive of transthyretin amyloidosis, genetic testing suggestive of wild type.  NYHA class II-III symptoms, she is at baseline. She does not look volume overloaded on exam.  - Continue torsemide 20 mg bid.    2. CKD stage 3: BMET today.   3. CAD: Cath in 8/19 with moderate RCA and LCx disease, severe distal LAD disease, medical management.  No chest pain. - No ASA given apixaban use.  - Continue pitivastatin and Praluent.  Lipids today.  - Continue fenofibrate.  4. Atrial fibrillation: Paroxysmal. She is in NSR today.  - Continue Eliquis.  5. HTN: BP fluctuates significantly.    - Continue Coreg 25 mg BID. - She cannot take amlodipine due to peripheral edema formation.  - Hold off on ACEI/ARB with CKD.  - Continue hydralazine.    6. PAD: She has a right heel ulcer followed by podiatry and is now s/p PTCA to right popliteal and right AT in 7/20.  Stable claudication.  - She has followup with vascular surgery.  7. 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  - She recently started tafamidis.  8. S/p TAVR: Bioprosthetic aortic valve stable on 10/20 TEE, mild peri-valvular  leakage.  9. 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  She has completed abx.  - I will arrange for IR to remove her tunneled catheter.  10. Vertigo: Sounds most like BPPV.  I will refill her meclizine.  Signed, [name], MD  06/16/2019   [facility] [address] [facility] [place] [address] [phone] (office) [phone] (fax)

## 2019-06-16 NOTE — Addendum Note (Signed)
Encounter addended by: Larey Dresser, MD on: 06/16/2019 11:30 PM  Actions taken: Clinical Note Signed

## 2019-06-17 ENCOUNTER — Telehealth (HOSPITAL_COMMUNITY): Payer: Self-pay

## 2019-06-17 DIAGNOSIS — T8203XD Leakage of heart valve prosthesis, subsequent encounter: Secondary | ICD-10-CM | POA: Diagnosis not present

## 2019-06-17 DIAGNOSIS — I872 Venous insufficiency (chronic) (peripheral): Secondary | ICD-10-CM | POA: Diagnosis not present

## 2019-06-17 DIAGNOSIS — E1151 Type 2 diabetes mellitus with diabetic peripheral angiopathy without gangrene: Secondary | ICD-10-CM | POA: Diagnosis not present

## 2019-06-17 DIAGNOSIS — L97312 Non-pressure chronic ulcer of right ankle with fat layer exposed: Secondary | ICD-10-CM | POA: Diagnosis not present

## 2019-06-17 DIAGNOSIS — Z4800 Encounter for change or removal of nonsurgical wound dressing: Secondary | ICD-10-CM | POA: Diagnosis not present

## 2019-06-17 DIAGNOSIS — A4181 Sepsis due to Enterococcus: Secondary | ICD-10-CM | POA: Diagnosis not present

## 2019-06-17 MED ORDER — FENOFIBRATE 145 MG PO TABS: 145.0000 mg | ORAL_TABLET | Freq: Every day | ORAL | 5 refills | Status: DC

## 2019-06-17 NOTE — Telephone Encounter (Signed)
-----   Message from Larey Dresser, MD sent at 06/17/2019 11:42 AM EST ----- Increase fenofibrate to 145 mg daily

## 2019-06-17 NOTE — Telephone Encounter (Signed)
Pt aware of results of lab work and plan to increase fenofibrate to 145mg  daily.  Pt verbalized understanding. Has an appt 12/30.  Will repeat lipids at that time.

## 2019-06-18 DIAGNOSIS — L97312 Non-pressure chronic ulcer of right ankle with fat layer exposed: Secondary | ICD-10-CM | POA: Diagnosis not present

## 2019-06-18 DIAGNOSIS — T8203XD Leakage of heart valve prosthesis, subsequent encounter: Secondary | ICD-10-CM | POA: Diagnosis not present

## 2019-06-18 DIAGNOSIS — I872 Venous insufficiency (chronic) (peripheral): Secondary | ICD-10-CM | POA: Diagnosis not present

## 2019-06-18 DIAGNOSIS — A4181 Sepsis due to Enterococcus: Secondary | ICD-10-CM | POA: Diagnosis not present

## 2019-06-18 DIAGNOSIS — E1151 Type 2 diabetes mellitus with diabetic peripheral angiopathy without gangrene: Secondary | ICD-10-CM | POA: Diagnosis not present

## 2019-06-18 DIAGNOSIS — Z4800 Encounter for change or removal of nonsurgical wound dressing: Secondary | ICD-10-CM | POA: Diagnosis not present

## 2019-06-18 NOTE — Telephone Encounter (Signed)
Spoke with IR and patient is set to have PICC removed 06/20/19 and she has been notified of appt.

## 2019-06-20 ENCOUNTER — Ambulatory Visit (HOSPITAL_COMMUNITY)
Admission: RE | Admit: 2019-06-20 | Discharge: 2019-06-20 | Disposition: A | Discharge status: Home / Self Care | Payer: Medicare Other | Source: Ambulatory Visit | Attending: Infectious Diseases | Admitting: Infectious Diseases

## 2019-06-20 ENCOUNTER — Encounter (HOSPITAL_COMMUNITY): Payer: Self-pay | Admitting: Student

## 2019-06-20 ENCOUNTER — Other Ambulatory Visit: Payer: Self-pay

## 2019-06-20 DIAGNOSIS — I330 Acute and subacute infective endocarditis: Secondary | ICD-10-CM | POA: Diagnosis not present

## 2019-06-20 DIAGNOSIS — Z452 Encounter for adjustment and management of vascular access device: Secondary | ICD-10-CM | POA: Diagnosis not present

## 2019-06-20 HISTORY — PX: IR REMOVAL TUN CV CATH W/O FL: IMG2289

## 2019-06-20 MED ORDER — CHLORHEXIDINE GLUCONATE 4 % EX LIQD
CUTANEOUS | Status: AC
  Filled 2019-06-20: qty 15

## 2019-06-25 ENCOUNTER — Telehealth: Payer: Self-pay | Admitting: Family Medicine

## 2019-06-25 DIAGNOSIS — L97312 Non-pressure chronic ulcer of right ankle with fat layer exposed: Secondary | ICD-10-CM | POA: Diagnosis not present

## 2019-06-25 DIAGNOSIS — I872 Venous insufficiency (chronic) (peripheral): Secondary | ICD-10-CM | POA: Diagnosis not present

## 2019-06-25 DIAGNOSIS — T8203XD Leakage of heart valve prosthesis, subsequent encounter: Secondary | ICD-10-CM | POA: Diagnosis not present

## 2019-06-25 DIAGNOSIS — Z4800 Encounter for change or removal of nonsurgical wound dressing: Secondary | ICD-10-CM | POA: Diagnosis not present

## 2019-06-25 DIAGNOSIS — E1151 Type 2 diabetes mellitus with diabetic peripheral angiopathy without gangrene: Secondary | ICD-10-CM | POA: Diagnosis not present

## 2019-06-25 DIAGNOSIS — A4181 Sepsis due to Enterococcus: Secondary | ICD-10-CM | POA: Diagnosis not present

## 2019-06-25 NOTE — Chronic Care Management (AMB) (Signed)
Chronic Care Management   Note  06/25/2019 Name: [name] MRN: [id number] DOB: 01/21/1951  [name] is a 68 y.o. year old female who is a primary care patient of Ronnie Doss M, DO. I reached out to [employer] by phone today in response to a referral sent by [name]'s health plan.     [name] was given information about Chronic Care Management services today including:  1. CCM service includes personalized support from designated clinical staff supervised by her physician, including individualized plan of care and coordination with other care providers 2. 24/7 contact phone numbers for assistance for urgent and routine care needs. 3. Service will only be billed when office clinical staff spend 20 minutes or more in a month to coordinate care. 4. Only one practitioner may furnish and bill the service in a calendar month. 5. The patient may stop CCM services at any time (effective at the end of the month) by phone call to the office staff. 6. The patient will be responsible for cost sharing (co-pay) of up to 20% of the service fee (after annual deductible is met).  Patient did not agree to services and wishes to consider information provided before deciding about enrollment in care management services.   Follow up plan: The care management team is available to follow up with the patient after provider conversation with the patient regarding recommendation for care management engagement and subsequent re-referral to the care management team.   [place], [place], [place] [address] Direct Dial: [email or web address]  Website: [email or web address]

## 2019-06-28 ENCOUNTER — Other Ambulatory Visit: Payer: Self-pay

## 2019-07-01 ENCOUNTER — Ambulatory Visit (INDEPENDENT_AMBULATORY_CARE_PROVIDER_SITE_OTHER): Payer: Medicare Other | Admitting: Family Medicine

## 2019-07-01 VITALS — BP 164/61 | HR 58 | Temp 97.2°F | Ht 67.0 in | Wt 202.0 lb

## 2019-07-01 DIAGNOSIS — E1122 Type 2 diabetes mellitus with diabetic chronic kidney disease: Secondary | ICD-10-CM

## 2019-07-01 DIAGNOSIS — E1142 Type 2 diabetes mellitus with diabetic polyneuropathy: Secondary | ICD-10-CM | POA: Diagnosis not present

## 2019-07-01 DIAGNOSIS — N184 Chronic kidney disease, stage 4 (severe): Secondary | ICD-10-CM | POA: Diagnosis not present

## 2019-07-01 DIAGNOSIS — E1169 Type 2 diabetes mellitus with other specified complication: Secondary | ICD-10-CM | POA: Diagnosis not present

## 2019-07-01 DIAGNOSIS — I10 Essential (primary) hypertension: Secondary | ICD-10-CM | POA: Diagnosis not present

## 2019-07-01 DIAGNOSIS — E1159 Type 2 diabetes mellitus with other circulatory complications: Secondary | ICD-10-CM | POA: Diagnosis not present

## 2019-07-01 DIAGNOSIS — Z23 Encounter for immunization: Secondary | ICD-10-CM

## 2019-07-01 DIAGNOSIS — E785 Hyperlipidemia, unspecified: Secondary | ICD-10-CM

## 2019-07-01 LAB — BAYER DCA HB A1C WAIVED: HB A1C (BAYER DCA - WAIVED): 10.6 % — ABNORMAL HIGH (ref <7.0)

## 2019-07-01 IMAGING — US US ABDOMEN LIMITED
1 series · 14 of 25 positions shown · non-contrast
Comparison: CT abdomen/pelvis 04/19/2018

CLINICAL DATA: Evaluate for ascites

EXAM:
LIMITED ABDOMEN ULTRASOUND FOR ASCITES
TECHNIQUE: Limited ultrasound survey for ascites was performed in all four
abdominal quadrants.

[Series 1: us abdomen limited · 28 acquisitions, 14 frames shown]
[im 1/28]
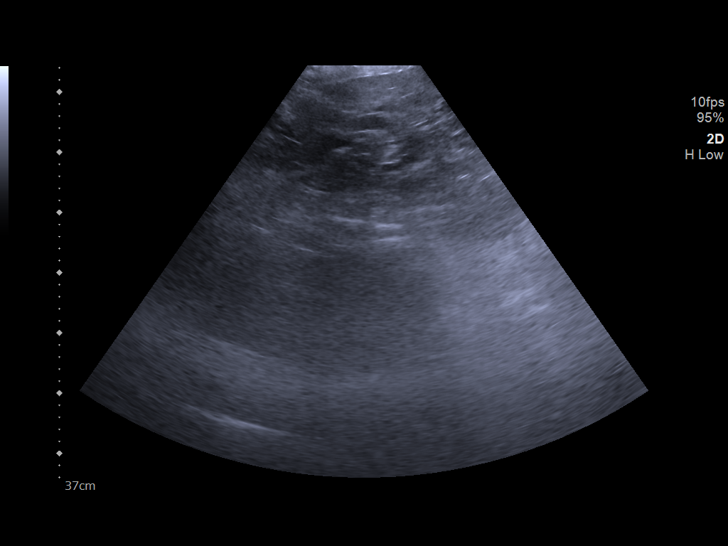
[im 3/28]
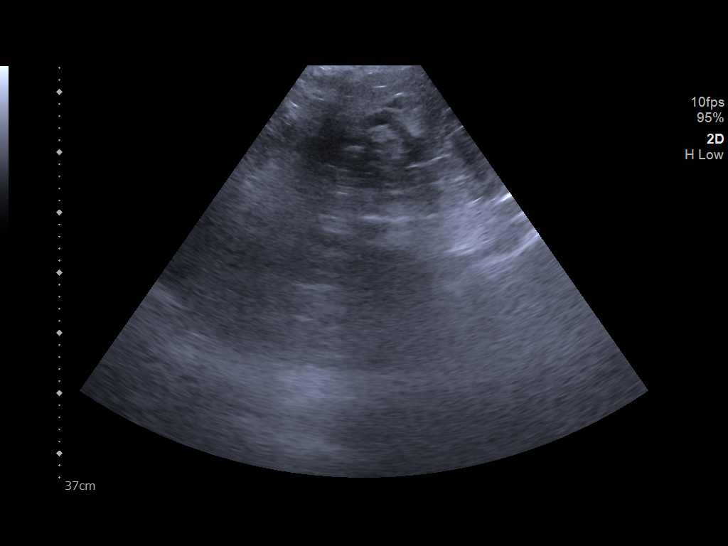
[im 5/28]
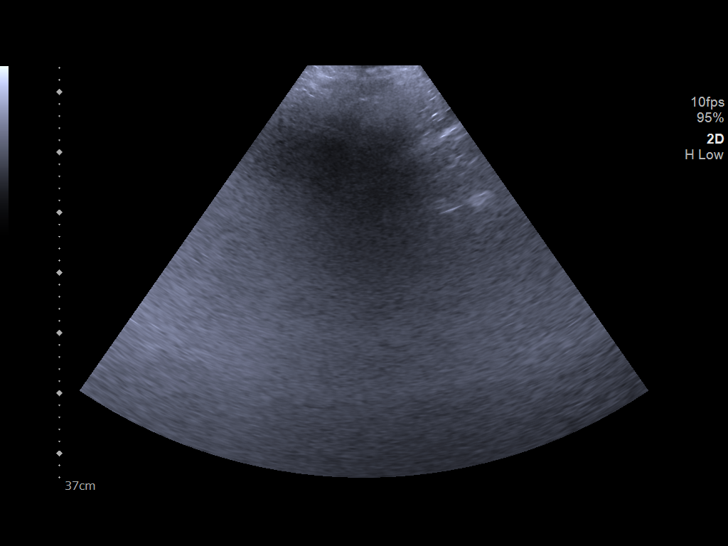
[im 7/28]
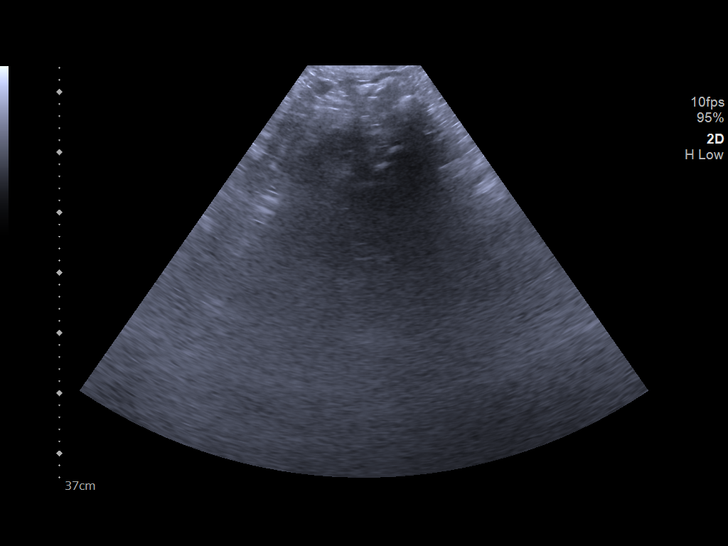
[im 10/28]
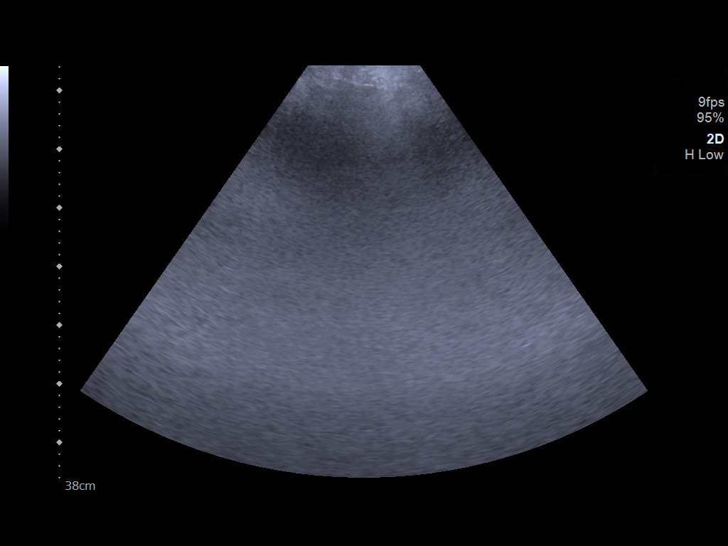
[im 11/28]
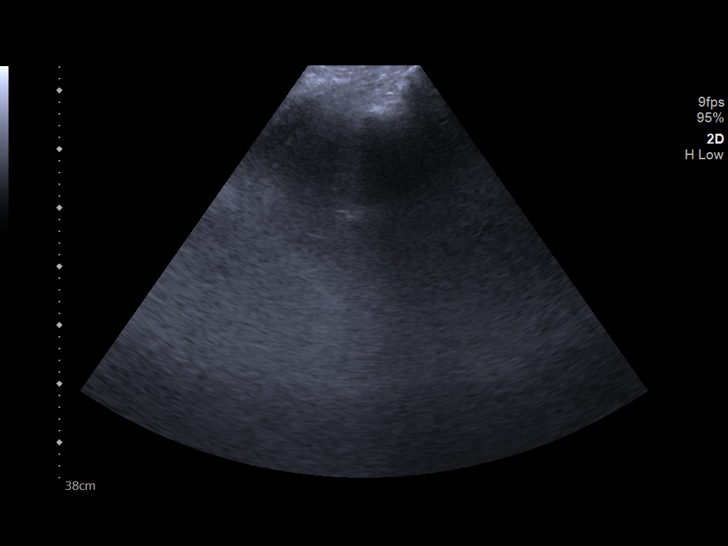
[im 13/28]
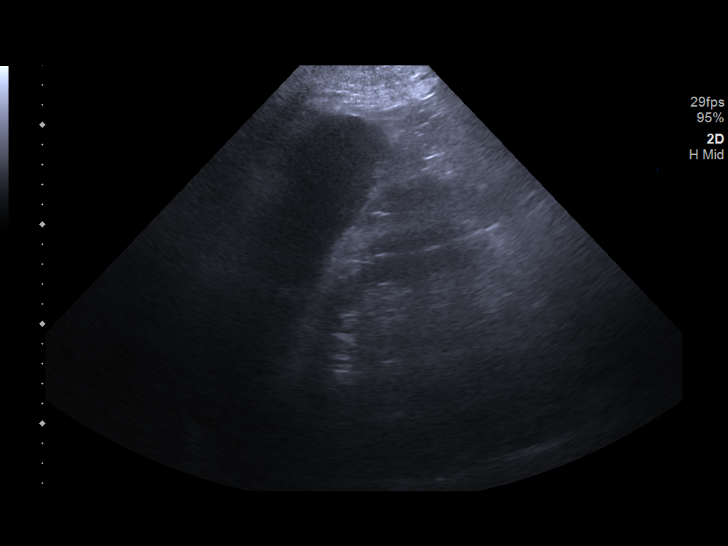
[im 15/28]
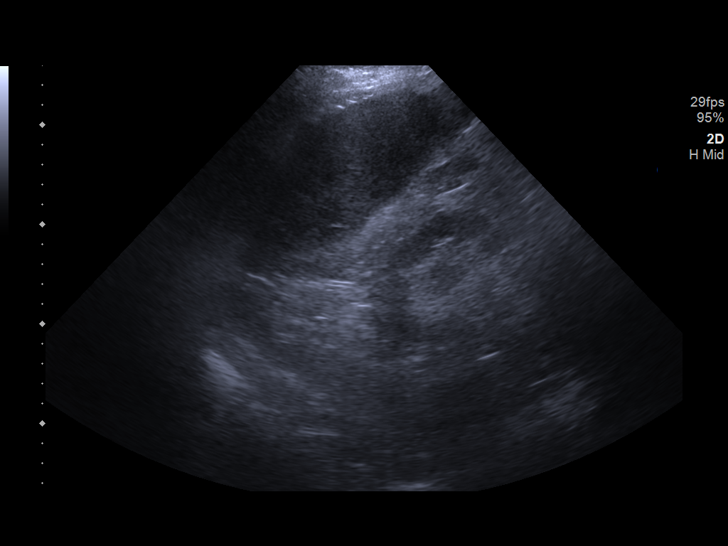
[im 17/28]
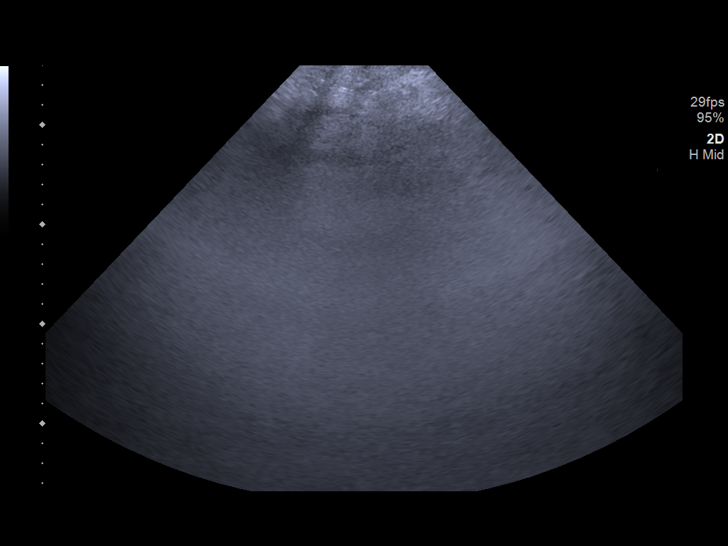
[im 19/28]
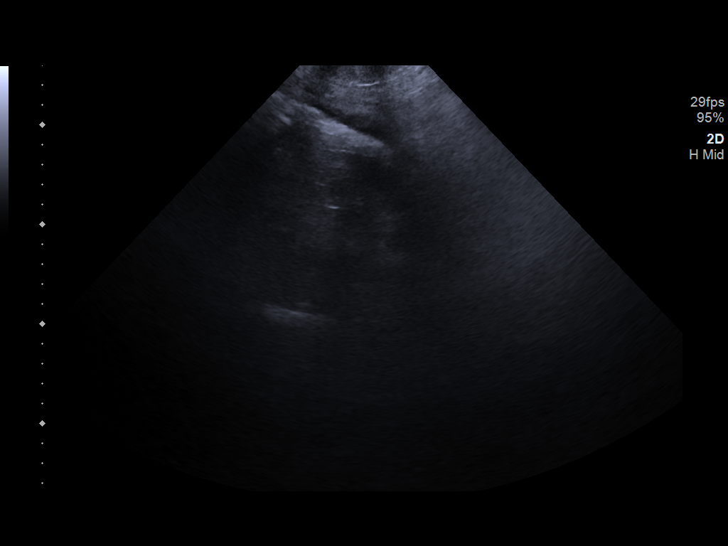
[im 21/28]
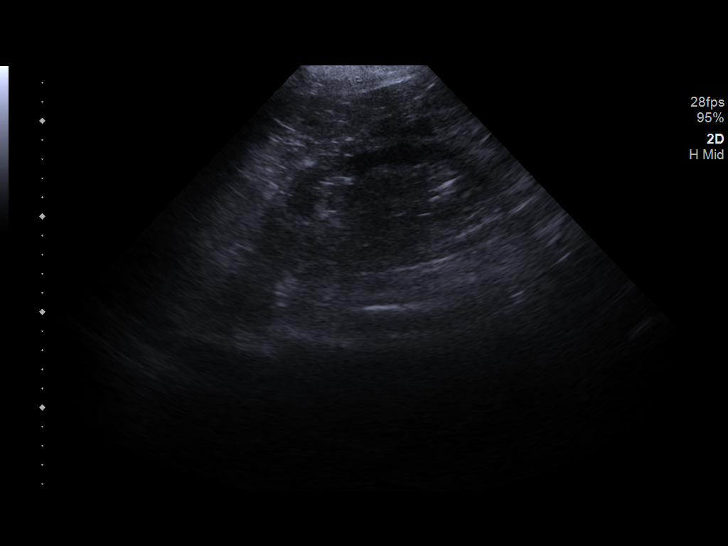
[im 23/28]
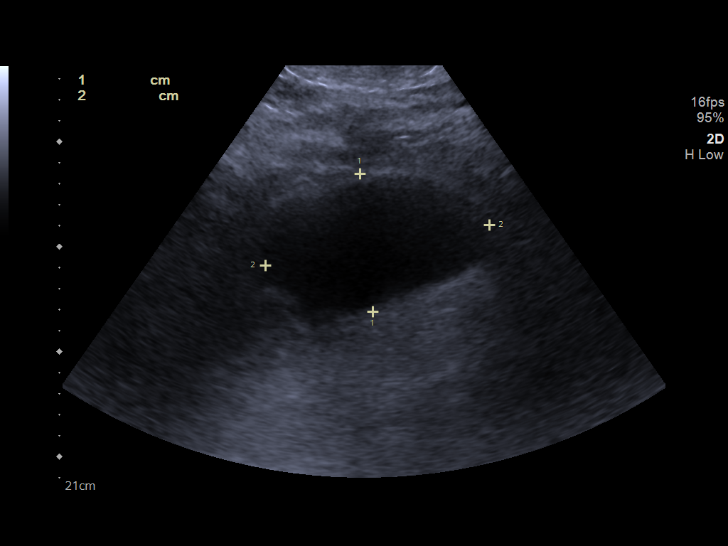
[im 25/28]
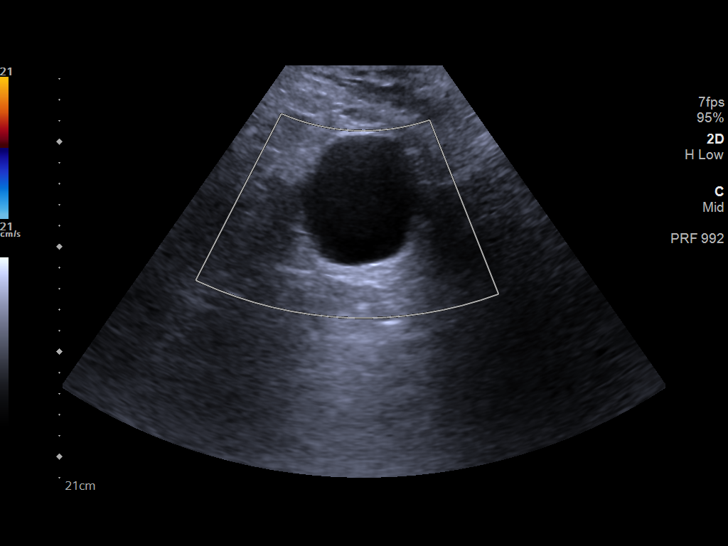
[im 28/28]
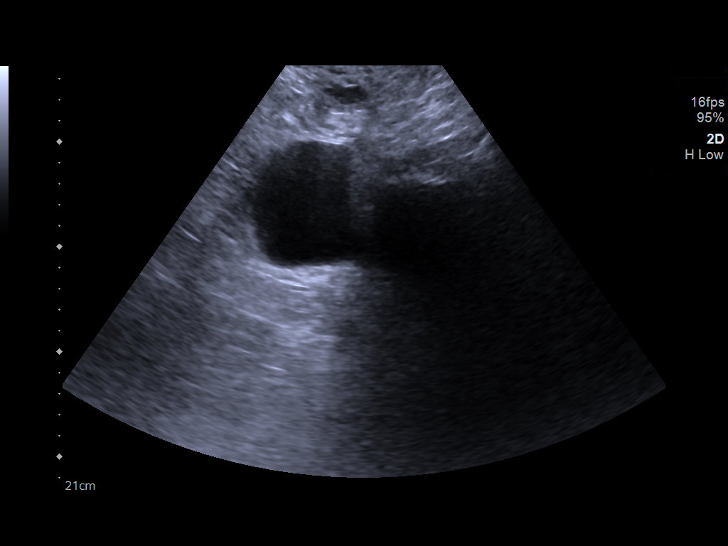

[14 of 25 positions shown; findings below may reference images not displayed]

FINDINGS: No significant abdominal ascites. 6.6 x 7.8 x 4.8 cm anechoic left
lower quadrant cystic mass of uncertain etiology.
IMPRESSION: 1. No significant abdominal ascites.
2. 6.6 x 7.8 x 4.8 cm anechoic left lower quadrant cystic mass of
uncertain etiology. Characterization is limited secondary to body
habitus. Recommend further characterization with a non emergent CT
of the pelvis following resolution of the patient's acute illness.

## 2019-07-01 NOTE — Progress Notes (Signed)
Subjective: CC: interval follow up PCP: Alison Norlander, DO PNT:[id number] [name] is a 68 y.o. female presenting to clinic today for:  1.  Type 2 diabetes with hypertension, hyperlipidemia, circulatory disorder and CKD 4 Patient notes that blood sugars tend to run fine during the morning but after lunch they rise quite high.  She currently is injecting 75 units of NovoLog mix 70-30 in the morning, 60 at noon and 60 at bedtime.  No hypoglycemic episodes.  She reports compliance with Coreg twice daily, hydralazine 3 times daily and Demadex twice daily.  She notes that she has been having some symptomatic hypotension after her morning doses of her blood pressure medicine.  Often she will wake up with systolic blood pressures in the 150s to 170s but after taking the medications they dropped down into the low 100s.  She gets very tired and needs to lay down as a result.  She is had some orthostasis as well.  She does also take Livalo 4 mg daily.  Denies any chest pain, lower extremity edema.  She is breathing normally.  She does have some low back pain but she thinks this is related to the fall.  She is working on weight loss.  She has not had her diabetic eye exam done yet.  She follows up with both Dr. Chalmers Cater (endo) and Dr Aundra Dubin (CHF).  She is followed by Dr. Carmina Miller but has not had an appointment in quite some time.  She will make sure to schedule this.   ROS: Per HPI  Allergies  Allergen Reactions  . Amlodipine Swelling    Ankle swelling  . Atorvastatin Other (See Comments)    Myalgias   . Crestor [Rosuvastatin Calcium] Other (See Comments)    Stiffness and back pain  . Ezetimibe-Simvastatin Other (See Comments)    Leg cramps  . Statins Other (See Comments)    Myalgias  . Celebrex [Celecoxib] Rash  . Levemir [Insulin Detemir] Rash   Past Medical History:  Diagnosis Date  . Atrial fibrillation ([place])    a. maintaining sinus s/Alison DCCV, on Eliquis  . CAD (coronary artery disease)    a. Nonobstructive by cath 2006 - 25% LAD, 25-30% after 1st diag, distal 25% PDA, minor irreg LCx. b. Normal nuc 2011.  . CKD (chronic kidney disease), stage III   . Cystocele   . Difficult intubation 09-07-2012   big neck trouble with intubation parotid surgery"takes little med to sedate"  . Esophageal stricture   . GERD (gastroesophageal reflux disease)   . Hiatal hernia    a. 07/2013: HH with small stricture holding up barium tablet (concides with patient's sx of food sticking).  . History of kidney stones   . Hyperlipidemia   . Hypertension   . IBS (irritable bowel syndrome)   . Memory difficulty 11/26/2015  . Morbid obesity ([place])    a. hx of gastric bypass (sleeve gastrectomy 2015)  . Osteoporosis   . 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  Marland Kitchen Pericardial effusion    a. Mod by echo 2012 at time of PNA. b. Echo 07/2012: small pericardial effusion vs fat.  . Pressure ulcer 04/16/2019   right foot  . S/Alison TAVR (transcatheter aortic valve replacement)   . Severe aortic stenosis   . Sleep apnea     "have mask; don't use it" (02/14/2018)  . TIA (transient ischemic attack) 10/2014; 06/2016   "some memory issues since; daughter says my speech is  sometimes different" (02/14/2018)  . Type II diabetes mellitus ([place])     Current Outpatient Medications:  .  ACCU-CHEK AVIVA PLUS test strip, USE TO CHECK BLOOD SUGAR UP TO TWICE DAILY OR AS INSTRUCTED, Disp: 100 each, Rfl: 2 .  Alirocumab (PRALUENT) 75 MG/ML SOAJ, Inject 75 mg into the skin every 14 (fourteen) days., Disp: 2 pen, Rfl: 6 .  B-D INS SYR ULTRAFINE 1CC/31G 31G X 5/16" 1 ML MISC, USE TO INJECT INSULIN TWICE A DAY AS INSTRUCTED (Patient taking differently: 1 Stick by Other route 3 (three) times daily before meals. ), Disp: 100 each, Rfl: 2 .  Calcium Carbonate-Vitamin D (CALCIUM 600+D) 600-400 MG-UNIT per tablet, Take 1 tablet by mouth 2 (two) times daily. (Patient taking differently: Take 1 tablet by mouth daily. ),  Disp: , Rfl:  .  carvedilol (COREG) 25 MG tablet, TAKE 1 TABLET BY MOUTH TWICE A DAY (Patient taking differently: Take 25 mg by mouth 2 (two) times daily with a meal. ), Disp: 180 tablet, Rfl: 3 .  Cholecalciferol (VITAMIN D3) 50 MCG (2000 UT) TABS, Take 2,000 Units by mouth daily., Disp: , Rfl:  .  ELIQUIS 5 MG TABS tablet, TAKE 1 TABLET BY MOUTH TWICE A DAY (Patient taking differently: Take 5 mg by mouth 2 (two) times daily. ), Disp: 180 tablet, Rfl: 0 .  fenofibrate (TRICOR) 145 MG tablet, Take 1 tablet (145 mg total) by mouth daily., Disp: 30 tablet, Rfl: 5 .  hydrALAZINE (APRESOLINE) 10 MG tablet, Take 1 tablet (10 mg total) by mouth every 6 (six) hours. (Patient taking differently: Take 10 mg by mouth 3 (three) times daily. ), Disp: 360 tablet, Rfl: 0 .  insulin aspart protamine- aspart (NOVOLOG MIX 70/30) (70-30) 100 UNIT/ML injection, Inject 60-75 Units into the skin See admin instructions. Inject 75 units in the morning and 60 units at lunch and 60 units at supper, Disp: , Rfl:  .  Insulin Pen Needle 32G X 4 MM MISC, Use to inject insulin with insulin pen, Disp: 100 each, Rfl: 4 .  LIVALO 4 MG TABS, TAKE 1 TABLET BY MOUTH EVERY DAY (Patient taking differently: Take 4 mg by mouth every evening. ), Disp: 90 tablet, Rfl: 1 .  meclizine (ANTIVERT) 12.5 MG tablet, Take 1 tablet (12.5 mg total) by mouth 3 (three) times daily as needed for dizziness., Disp: 30 tablet, Rfl: 5 .  Multiple Vitamins-Minerals (MULTIVITAMIN GUMMIES ADULT PO), Take 2 tablets by mouth daily., Disp: , Rfl:  .  nutrition supplement, JUVEN, (JUVEN) PACK, Take 1 packet by mouth 2 (two) times daily between meals., Disp: 60 packet, Rfl: 0 .  ondansetron (ZOFRAN ODT) 4 MG disintegrating tablet, Take 1 tablet (4 mg total) by mouth every 8 (eight) hours as needed for nausea., Disp: 10 tablet, Rfl: 0 .  Prenatal Vit-Fe Fumarate-FA (PRENATAL MULTIVITAMIN) TABS tablet, Take 1 tablet by mouth 3 (three) times a week., Disp: , Rfl:  .   Propylene Glycol (SYSTANE COMPLETE) 0.6 % SOLN, Apply 1 drop to eye daily as needed (dry eyes). , Disp: , Rfl:  .  Tafamidis (VYNDAMAX) 61 MG CAPS, Take by mouth., Disp: , Rfl:  .  torsemide (DEMADEX) 20 MG tablet, TAKE 1.5 TABLETS (30 MG TOTAL) BY MOUTH EVERY MORNING AND 1 TABLET (20 MG TOTAL) AT BEDTIME. (Patient taking differently: Take 20 mg by mouth 2 (two) times daily. ), Disp: 225 tablet, Rfl: 0 Social History   Socioeconomic History  . Marital status: Married    Spouse  name: Not on file  . Number of children: 3  . Years of education: some college  . Highest education level: Some college, no degree  Occupational History  . Occupation: retired    Fish farm manager: [employer]  . Financial resource strain: Not hard at all  . Food insecurity    Worry: Never true    Inability: Never true  . Transportation needs    Medical: No    Non-medical: No  Tobacco Use  . Smoking status: Never Smoker  . Smokeless tobacco: Never Used  Substance and Sexual Activity  . Alcohol use: Yes    Alcohol/week: 0.0 standard drinks    Comment:  "mixed drink q few years"  . Drug use: Never  . Sexual activity: Yes    Birth control/protection: Post-menopausal  Lifestyle  . Physical activity    Days per week: 0 days    Minutes per session: 0 min  . Stress: Only a little  Relationships  . Social connections    Talks on phone: More than three times a week    Gets together: More than three times a week    Attends religious service: More than 4 times per year    Active member of club or organization: Yes    Attends meetings of clubs or organizations: More than 4 times per year    Relationship status: Married  . Intimate partner violence    Fear of current or ex partner: No    Emotionally abused: No    Physically abused: No    Forced sexual activity: No  Other Topics Concern  . Not on file  Social History Narrative   Married   Patient is right handed.   Patient rarely drinks  caffeine.   Lives at home with husband in two story home. Husband washes laundry in the basement. They have 3 daughters and 7 grandchildren.    Family History  Problem Relation Age of Onset  . Heart failure Mother   . Hypertension Mother   . Skin cancer Mother   . Colitis Mother   . Diabetes Mother   . Kidney Stones Mother   . Stroke Mother   . Heart disease Father   . Colon polyps Father   . Diabetes Father   . Heart failure Father   . Heart attack Sister   . Diabetes Other   . Stroke Maternal Grandfather   . Diabetes Maternal Grandfather   . Colon cancer Neg Hx     Objective: Office vital signs reviewed. BP (!) 164/61   Pulse (!) 58   Temp (!) 97.2 F (36.2 C) (Temporal)   Ht 5\' 7"  (1.702 m)   Wt 202 lb (91.6 kg)   SpO2 96%   BMI 31.64 kg/m   Physical Examination:  General: Awake, alert, chronically ill appearing, No acute distress HEENT: Normal; cushingoid body habitus Cardio: regular rate and rhythm, C1E7 heard, systolic murmur noted Pulm: clear to auscultation bilaterally, no wheezes, rhonchi or rales; normal work of breathing on room air GI: Protuberant abdomen Extremities: No edema.  Poor tone in the lower extremities MSK: Arrives in wheelchair Skin: dry; intact  Psych: Pleasant, interactive.  Oriented.  Results for orders placed or performed in visit on 07/01/19 (from the past 24 hour(s))  Bayer DCA Hb A1c Waived     Status: Abnormal   Collection Time: 07/01/19  2:37 PM  Result Value Ref Range   HB A1C (BAYER DCA - WAIVED) 10.6 (H) <7.0 %  Narrative   Performed at:  [address] [place], [place]  [id number] Lab Director: Shaquela Weichert [facility], Phone:  [phone]    Assessment/ Plan: 68 y.o. female   1. Type 2 diabetes mellitus with peripheral neuropathy (HCC) Not at goal.  A1c was 10.6 today.  I have CCed this result to her endocrinologist, Dr. Chalmers Cater.  Patient will call the office to schedule a visit.  Pneumococcal  vaccination was administered during today's visit.  We will try and get a copy of her most recent eye visit - Bayer DCA Hb A1c Waived  2. CKD stage 4 due to type 2 diabetes mellitus (HCC) Check renal function. - Basic Metabolic Panel - CBC  3. Hypertension associated with diabetes ([place]) Was somewhat on the high side today.  Unsure if this was related to the appointment.  Her reports of hypotension at home is somewhat concerning and have instructed her to separate out some of her morning blood pressure medicine and perhaps take some after breakfast so as to not precipitate hypotensive episodes.  I will also CC this chart to her cardiologist to see if he has any input on dosing.  I would hesitate to reduce anything at this point given her technically uncontrolled blood pressure, known renal disease, history of TIA and other multiple risk factors for cardiovascular events.  4. Hyperlipidemia associated with type 2 diabetes mellitus ([place]) Continue cholesterol meds   No orders of the defined types were placed in this encounter.  No orders of the defined types were placed in this encounter.    Alison Norlander, DO [place] [phone]

## 2019-07-01 NOTE — Patient Instructions (Addendum)
Dr Adin Hector number [phone]  Make sure that you change up the timing of your blood pressure medication as we discussed.  I'll copy your chart to Dr Chalmers Cater and Dr Aundra Dubin.  Make sure to schedule your eye appointment.  You got your Pneumonia shot today.

## 2019-07-02 LAB — BASIC METABOLIC PANEL
BUN/Creatinine Ratio: 27 (ref 12–28)
BUN: 67 mg/dL — ABNORMAL HIGH (ref 8–27)
CO2: 28 mmol/L (ref 20–29)
Calcium: 9.5 mg/dL (ref 8.7–10.3)
Chloride: 99 mmol/L (ref 96–106)
Creatinine, Ser: 2.48 mg/dL — ABNORMAL HIGH (ref 0.57–1.00)
GFR calc Af Amer: 22 mL/min/{1.73_m2} — ABNORMAL LOW (ref >59)
GFR calc non Af Amer: 19 mL/min/{1.73_m2} — ABNORMAL LOW (ref >59)
Glucose: 202 mg/dL — ABNORMAL HIGH (ref 65–99)
Potassium: 3.8 mmol/L (ref 3.5–5.2)
Sodium: 145 mmol/L — ABNORMAL HIGH (ref 134–144)

## 2019-07-02 LAB — CBC
Hematocrit: 42.5 % (ref 34.0–46.6)
Hemoglobin: 14.2 g/dL (ref 11.1–15.9)
MCH: 27.4 pg (ref 26.6–33.0)
MCHC: 33.4 g/dL (ref 31.5–35.7)
MCV: 82 fL (ref 79–97)
Platelets: 202 10*3/uL (ref 150–450)
RBC: 5.18 x10E6/uL (ref 3.77–5.28)
RDW: 14.2 % (ref 11.7–15.4)
WBC: 9.8 10*3/uL (ref 3.4–10.8)

## 2019-07-04 ENCOUNTER — Encounter (HOSPITAL_COMMUNITY): Payer: Self-pay | Admitting: Cardiovascular Disease

## 2019-07-15 ENCOUNTER — Ambulatory Visit: Payer: Medicare Other | Admitting: Cardiology

## 2019-07-16 DIAGNOSIS — I10 Essential (primary) hypertension: Secondary | ICD-10-CM | POA: Diagnosis not present

## 2019-07-16 DIAGNOSIS — R809 Proteinuria, unspecified: Secondary | ICD-10-CM | POA: Diagnosis not present

## 2019-07-16 DIAGNOSIS — E1165 Type 2 diabetes mellitus with hyperglycemia: Secondary | ICD-10-CM | POA: Diagnosis not present

## 2019-07-16 DIAGNOSIS — E7800 Pure hypercholesterolemia, unspecified: Secondary | ICD-10-CM | POA: Diagnosis not present

## 2019-07-16 DIAGNOSIS — E669 Obesity, unspecified: Secondary | ICD-10-CM | POA: Diagnosis not present

## 2019-07-16 DIAGNOSIS — N189 Chronic kidney disease, unspecified: Secondary | ICD-10-CM | POA: Diagnosis not present

## 2019-07-22 ENCOUNTER — Other Ambulatory Visit: Payer: Self-pay

## 2019-07-22 ENCOUNTER — Other Ambulatory Visit: Payer: Medicare Other

## 2019-07-22 DIAGNOSIS — N184 Chronic kidney disease, stage 4 (severe): Secondary | ICD-10-CM | POA: Diagnosis not present

## 2019-07-22 DIAGNOSIS — E785 Hyperlipidemia, unspecified: Secondary | ICD-10-CM | POA: Diagnosis not present

## 2019-07-22 DIAGNOSIS — Z79899 Other long term (current) drug therapy: Secondary | ICD-10-CM

## 2019-07-22 DIAGNOSIS — I5033 Acute on chronic diastolic (congestive) heart failure: Secondary | ICD-10-CM

## 2019-07-23 LAB — LIPID PANEL
Chol/HDL Ratio: 3.3 ratio (ref 0.0–4.4)
Cholesterol, Total: 140 mg/dL (ref 100–199)
HDL: 43 mg/dL (ref >39)
LDL Chol Calc (NIH): 33 mg/dL (ref 0–99)
Triglycerides: 457 mg/dL — ABNORMAL HIGH (ref 0–149)
VLDL Cholesterol Cal: 64 mg/dL — ABNORMAL HIGH (ref 5–40)

## 2019-07-23 LAB — HEPATIC FUNCTION PANEL
ALT: 13 IU/L (ref 0–32)
AST: 16 IU/L (ref 0–40)
Albumin: 4.3 g/dL (ref 3.8–4.8)
Alkaline Phosphatase: 64 IU/L (ref 39–117)
Bilirubin Total: 0.4 mg/dL (ref 0.0–1.2)
Bilirubin, Direct: 0.14 mg/dL (ref 0.00–0.40)
Total Protein: 6.6 g/dL (ref 6.0–8.5)

## 2019-07-24 ENCOUNTER — Other Ambulatory Visit (HOSPITAL_COMMUNITY): Payer: Self-pay

## 2019-07-24 MED ORDER — VASCEPA 1 G PO CAPS: 2.0000 g | ORAL_CAPSULE | Freq: Two times a day (BID) | ORAL | 3 refills | Status: DC

## 2019-07-29 ENCOUNTER — Telehealth (HOSPITAL_COMMUNITY): Payer: Self-pay | Admitting: Pharmacist

## 2019-07-29 ENCOUNTER — Telehealth (HOSPITAL_COMMUNITY): Payer: Self-pay | Admitting: Pharmacy Technician

## 2019-07-29 MED ORDER — VYNDAMAX 61 MG PO CAPS: 61.0000 mg | ORAL_CAPSULE | Freq: Every day | ORAL | 11 refills | Status: DC

## 2019-07-29 NOTE — Telephone Encounter (Signed)
Sent Vyndamax prescription to [facility]. Will use PAN [employer] for copay assistance.   Audry Riles, PharmD, BCPS, BCCP, CPP Heart Failure Clinic Pharmacist [phone]

## 2019-07-29 NOTE — Telephone Encounter (Signed)
Patient Advocate Encounter   Was successful in securing patient an $7,800.00 grant from Patient [employer] [facility]) to provide copayment coverage for Vyndamax.  This will keep the out of pocket expense at $0.     I have spoken with the patient.    The billing information is as follows and has been shared with [place].   Member ID: [id number] Group ID: [id number] RxBin: [id number] Dates of Eligibility: 04/30/2019 through 08/14/2020  Fund:  [place]  Spoke to patient about enrolling in [place] services. She is aware that after 08/15/2019 we will be sending her medications through that service and not [facility]. We will still need her filled out re-enrollment application for [facility], after PAN has been exhausted. Patient acknowledged that she has not filled that out at this point. Will call patient back to set up shipment of Vyndamax for her.  Charlann Boxer, CPhT

## 2019-07-30 DIAGNOSIS — I129 Hypertensive chronic kidney disease with stage 1 through stage 4 chronic kidney disease, or unspecified chronic kidney disease: Secondary | ICD-10-CM | POA: Diagnosis not present

## 2019-07-30 DIAGNOSIS — R7881 Bacteremia: Secondary | ICD-10-CM | POA: Diagnosis not present

## 2019-07-30 DIAGNOSIS — I5030 Unspecified diastolic (congestive) heart failure: Secondary | ICD-10-CM | POA: Diagnosis not present

## 2019-07-30 DIAGNOSIS — E1122 Type 2 diabetes mellitus with diabetic chronic kidney disease: Secondary | ICD-10-CM | POA: Diagnosis not present

## 2019-07-30 DIAGNOSIS — B952 Enterococcus as the cause of diseases classified elsewhere: Secondary | ICD-10-CM | POA: Diagnosis not present

## 2019-07-30 DIAGNOSIS — N184 Chronic kidney disease, stage 4 (severe): Secondary | ICD-10-CM | POA: Diagnosis not present

## 2019-08-06 LAB — HM DIABETES EYE EXAM

## 2019-08-06 NOTE — Telephone Encounter (Signed)
Patient Advocate Encounter  Was successful in securing patient an $8,000.00 grant from [employer] to provide copayment coverage for [employer].  This will keep the out of pocket expense at $0.      The billing information is as follows and has been shared with [place].   Member ID: [id number] Group ID: [id number] RxBin: [id number] Dates of Eligibility: 07/07/2019 through 07/05/2020  Fund:  [place], CPhT

## 2019-08-12 MED FILL — VYNDAMAX 61 MG CAPS: 61 | 30 days supply | Qty: 30 | Fill #0

## 2019-08-12 NOTE — Telephone Encounter (Signed)
Spoke with patient who has been enrolled into [employer]. We will ship Vyndamax to her Tuesday 12/29 with an anticipated delivery of 12/30. Explained to patient who she will refill her medication in the future. She is aware that we wiill need to still seek manufacturer's assistance for when grants run out, probably mid way through the year.   Specialty Pharmacy Phone #: [place], CPhT

## 2019-08-14 ENCOUNTER — Ambulatory Visit (HOSPITAL_COMMUNITY)
Admission: RE | Admit: 2019-08-14 | Discharge: 2019-08-14 | Disposition: A | Discharge status: Home / Self Care | Payer: Medicare Other | Source: Ambulatory Visit | Attending: Cardiology | Admitting: Cardiology

## 2019-08-14 ENCOUNTER — Telehealth (HOSPITAL_COMMUNITY): Payer: Self-pay

## 2019-08-14 ENCOUNTER — Encounter (HOSPITAL_COMMUNITY): Payer: Self-pay | Admitting: Cardiology

## 2019-08-14 ENCOUNTER — Other Ambulatory Visit: Payer: Self-pay

## 2019-08-14 VITALS — BP 156/53 | HR 78 | Wt 202.4 lb

## 2019-08-14 DIAGNOSIS — I2510 Atherosclerotic heart disease of native coronary artery without angina pectoris: Secondary | ICD-10-CM | POA: Insufficient documentation

## 2019-08-14 DIAGNOSIS — Z9884 Bariatric surgery status: Secondary | ICD-10-CM | POA: Insufficient documentation

## 2019-08-14 DIAGNOSIS — E11621 Type 2 diabetes mellitus with foot ulcer: Secondary | ICD-10-CM | POA: Diagnosis not present

## 2019-08-14 DIAGNOSIS — Z888 Allergy status to other drugs, medicaments and biological substances status: Secondary | ICD-10-CM | POA: Diagnosis not present

## 2019-08-14 DIAGNOSIS — E1169 Type 2 diabetes mellitus with other specified complication: Secondary | ICD-10-CM | POA: Diagnosis not present

## 2019-08-14 DIAGNOSIS — E1151 Type 2 diabetes mellitus with diabetic peripheral angiopathy without gangrene: Secondary | ICD-10-CM | POA: Insufficient documentation

## 2019-08-14 DIAGNOSIS — I5022 Chronic systolic (congestive) heart failure: Secondary | ICD-10-CM

## 2019-08-14 DIAGNOSIS — Z823 Family history of stroke: Secondary | ICD-10-CM | POA: Insufficient documentation

## 2019-08-14 DIAGNOSIS — Z6831 Body mass index (BMI) 31.0-31.9, adult: Secondary | ICD-10-CM | POA: Diagnosis not present

## 2019-08-14 DIAGNOSIS — L97419 Non-pressure chronic ulcer of right heel and midfoot with unspecified severity: Secondary | ICD-10-CM | POA: Diagnosis not present

## 2019-08-14 DIAGNOSIS — I43 Cardiomyopathy in diseases classified elsewhere: Secondary | ICD-10-CM | POA: Insufficient documentation

## 2019-08-14 DIAGNOSIS — I480 Paroxysmal atrial fibrillation: Secondary | ICD-10-CM | POA: Diagnosis not present

## 2019-08-14 DIAGNOSIS — E854 Organ-limited amyloidosis: Secondary | ICD-10-CM | POA: Insufficient documentation

## 2019-08-14 DIAGNOSIS — N183 Chronic kidney disease, stage 3 unspecified: Secondary | ICD-10-CM | POA: Insufficient documentation

## 2019-08-14 DIAGNOSIS — Z833 Family history of diabetes mellitus: Secondary | ICD-10-CM | POA: Diagnosis not present

## 2019-08-14 DIAGNOSIS — Z794 Long term (current) use of insulin: Secondary | ICD-10-CM | POA: Diagnosis not present

## 2019-08-14 DIAGNOSIS — E785 Hyperlipidemia, unspecified: Secondary | ICD-10-CM | POA: Diagnosis not present

## 2019-08-14 DIAGNOSIS — Z8249 Family history of ischemic heart disease and other diseases of the circulatory system: Secondary | ICD-10-CM | POA: Diagnosis not present

## 2019-08-14 DIAGNOSIS — Z952 Presence of prosthetic heart valve: Secondary | ICD-10-CM | POA: Diagnosis not present

## 2019-08-14 DIAGNOSIS — Z7901 Long term (current) use of anticoagulants: Secondary | ICD-10-CM | POA: Diagnosis not present

## 2019-08-14 DIAGNOSIS — G4733 Obstructive sleep apnea (adult) (pediatric): Secondary | ICD-10-CM | POA: Diagnosis not present

## 2019-08-14 DIAGNOSIS — B952 Enterococcus as the cause of diseases classified elsewhere: Secondary | ICD-10-CM | POA: Diagnosis not present

## 2019-08-14 DIAGNOSIS — Z79899 Other long term (current) drug therapy: Secondary | ICD-10-CM | POA: Diagnosis not present

## 2019-08-14 DIAGNOSIS — I5032 Chronic diastolic (congestive) heart failure: Secondary | ICD-10-CM | POA: Diagnosis not present

## 2019-08-14 DIAGNOSIS — I130 Hypertensive heart and chronic kidney disease with heart failure and stage 1 through stage 4 chronic kidney disease, or unspecified chronic kidney disease: Secondary | ICD-10-CM | POA: Diagnosis not present

## 2019-08-14 DIAGNOSIS — Z886 Allergy status to analgesic agent status: Secondary | ICD-10-CM | POA: Insufficient documentation

## 2019-08-14 DIAGNOSIS — Z808 Family history of malignant neoplasm of other organs or systems: Secondary | ICD-10-CM | POA: Insufficient documentation

## 2019-08-14 DIAGNOSIS — E1122 Type 2 diabetes mellitus with diabetic chronic kidney disease: Secondary | ICD-10-CM

## 2019-08-14 MED ORDER — TORSEMIDE 20 MG PO TABS: ORAL_TABLET | ORAL | 6 refills | Status: DC

## 2019-08-14 NOTE — Telephone Encounter (Signed)
Lab called to report that her bmp hemolyzed and she needed to repeat it.    I called her to schedule her a lab appointment to repeat the lab but she didn't answer,left a message to return call    FYI

## 2019-08-14 NOTE — Patient Instructions (Signed)
CHANGE HOW YOU TAKE TORSEMIDE: Take Torsemide 20mg  (1 tab) twice a day alternating with 20mg  (1 tab) daily every other day.   Labs today and lipid check in 1 month We will only contact you if something comes back abnormal or we need to make some changes. Otherwise no news is good news!   Your physician recommends that you schedule a follow-up appointment in: 3 months with Dr Aundra Dubin   At the [facility], you and your health needs are our priority. As part of our continuing mission to provide you with exceptional heart care, we have created designated Provider Care Teams. These Care Teams include your primary Cardiologist (physician) and Advanced Practice Providers (APPs- Physician Assistants and Nurse Practitioners) who all work together to provide you with the care you need, when you need it.   You may see any of the following providers on your designated Care Team at your next follow up: Marland Kitchen Dr Glori Bickers . Dr [name] . Darrick Grinder, NP . Lyda Jester, PA . Audry Riles, PharmD   Please be sure to bring in all your medications bottles to every appointment.   a

## 2019-08-15 NOTE — Progress Notes (Signed)
Date:  08/15/2019   ID:  [name], DOB November 16, 1950, MRN [id number]    Provider location: [facility] Type of Visit: Established patient   PCP:  [name], DO  Cardiologist:  Minus Breeding, MD Primary HF: Dr. Aundra Dubin  Chief Complaint: Shortness of breath   History of Present Illness: [name] is a 68 y.o. female who has a history of AS s/p TAVR, chronic diastolic CHF, PAF, CKD III, HLD, HTN, OSA, and morbid obesity.   She was admitted from [facility] office 09/19/18 with marked volume overload. She diuresed 36 lbs with lasix drip and was transitioned to torsemide 40 mg daily. Course complicated by AKI. RHC completed and showed elevated filling pressures and preserved cardiac output.  PYP scan suggestive of TTR amyloid. Discharge weight was 208 pounds.   She developed a non-healing right heal ulcer followed by a podiatrist in [place].  She ended up having a peripheral angiogram in [place] with PTCA to the right popliteal and right AT.    In 9/20, she was admitted with fever and found to have Enterococcal bacteremia.  TEE was concerning for possible MV vegetation, bioprosthetic aortic valve looked ok.  She was treated with IV antibiotics, and repeat TEE in 10/20 did not show residual active endocarditis.    She returns today for followup of CHF, PAF, aortic valve disease.  Breathing is stable, she gets short of breath walking more than about 100 feet at a time but generally does well walking around the house. BP fluctuates a lot, ranging from 329J-188C systolic (checks regularly at home), she is lightheaded at times with standing fairly frequently.  No chest pain. No BRBPR/melena.   No significant claudication but left foot chronically feels "numb."  Weight is down 2 lbs.    ECG (personally reviewed): NSR, anterolateral TWIs with ST depression.   Labs (11/19): LDL 115 Labs (2/20): K 4.4, creatinine 2.39, myeloma panel negative Labs  (7/20): LDL 150, triglycerides 422, K 4.1, creatinine 2.97 => 2.06 Labs (11/20): K 3.8, creatinine 2.48 Labs (12/20): K 4.4, creatinine 2.75, LDL 33, TGs 457  PMH: 1. Chronic diastolic CHF: Echo (1/66) with EF 60-65%, moderate LVH, mildly dilated RV with mildly decreased systolic function, bioprosthetic aortic valve s/p TAVR appeared to function normally.  - RHC (2/20): mean RA 13, PA 50/21 mean 33, PCWP mean 25, CI 2.92, PVR 1.3 WU  - TEE (10/20): EF 60-65%, mild LVH, mild MR, s/p TAVR with no stenosis and mild peri-valvular leakage, no active endocarditis.  2. Cardiac amyloidosis: TTR amyloidosis.   - PYP scan (2/20): grade 2, H/CL ratio 1.8 => suggestive of TTR amyloidosis.  - myeloma panel negative.  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  3. Bioprosthetic aortic valve s/p TAVR.  4. CKD: Stage 3.  5. Atrial fibrillation: Paroxysmal.  6. Hyperlipidemia: unable to tolerate Crestor or Vascepa.  7. GERD with hiatal hernia 8. HTN 9. Obesity s/p gastric bypass in 2015.  10. Type 2 diabetes 11. OSA: Unable to tolerate CPAP.  12. CAD: LHC (8/19) with 50% proximal RCA, 50% proximal LCx, 65% mid LCx, 99% distal LAD (medical management).  13. Carotid dopplers (8/19) with mild disease.  14. PAD: Peripheral arterial angiography in 7/20 with PTCA right popliteal artery and right AT.  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    Current Outpatient Medications  Medication Sig Dispense Refill  . ACCU-CHEK AVIVA PLUS test strip USE TO CHECK BLOOD  SUGAR UP TO TWICE DAILY OR AS INSTRUCTED 100 each 2  . Alirocumab (PRALUENT) 75 MG/ML SOAJ Inject 75 mg into the skin every 14 (fourteen) days. 2 pen 6  . apixaban (ELIQUIS) 5 MG TABS tablet Take 5 mg by mouth 2 (two) times daily.    . B-D INS SYR ULTRAFINE 1CC/31G 31G X 5/16" 1 ML MISC USE TO INJECT INSULIN TWICE A DAY AS INSTRUCTED (Patient taking differently: 1 Stick by Other route 3 (three) times daily before meals.  ) 100 each 2  . Calcium Carbonate-Vitamin D (CALCIUM 600+D) 600-400 MG-UNIT tablet Take 1 tablet by mouth daily.    . carvedilol (COREG) 25 MG tablet Take 25 mg by mouth 2 (two) times daily with a meal.    . Cholecalciferol (VITAMIN D3) 50 MCG (2000 UT) TABS Take 2,000 Units by mouth daily.    . fenofibrate (TRICOR) 145 MG tablet Take 1 tablet (145 mg total) by mouth daily. 30 tablet 5  . hydrALAZINE (APRESOLINE) 10 MG tablet Take 10 mg by mouth 3 (three) times daily.    Marland Kitchen icosapent Ethyl (VASCEPA) 1 g capsule Take 2 capsules (2 g total) by mouth 2 (two) times daily. 120 capsule 3  . insulin NPH Human (NOVOLIN N) 100 UNIT/ML injection Inject into the skin. 50 am 50 lunch 25 bedtime sliding scale    . Insulin Pen Needle 32G X 4 MM MISC Use to inject insulin with insulin pen 100 each 4  . meclizine (ANTIVERT) 12.5 MG tablet Take 1 tablet (12.5 mg total) by mouth 3 (three) times daily as needed for dizziness. 30 tablet 5  . Multiple Vitamins-Minerals (MULTIVITAMIN GUMMIES ADULT PO) Take 2 tablets by mouth daily.    . nutrition supplement, JUVEN, (JUVEN) PACK Take 1 packet by mouth 2 (two) times daily between meals. 60 packet 0  . ondansetron (ZOFRAN ODT) 4 MG disintegrating tablet Take 1 tablet (4 mg total) by mouth every 8 (eight) hours as needed for nausea. 10 tablet 0  . Pitavastatin Calcium (LIVALO) 4 MG TABS Take 4 mg by mouth every evening.    . Prenatal Vit-Fe Fumarate-FA (PRENATAL MULTIVITAMIN) TABS tablet Take 1 tablet by mouth 3 (three) times a week.    Marland Kitchen Propylene Glycol (SYSTANE COMPLETE) 0.6 % SOLN Apply 1 drop to eye daily as needed (dry eyes).     . Tafamidis (VYNDAMAX) 61 MG CAPS Take 61 mg by mouth daily. 30 capsule 11  . torsemide (DEMADEX) 20 MG tablet Take one tablet twice a day alternating with 1 tablet daily every other day 45 tablet 6   No current facility-administered medications for this encounter.    Allergies:   Amlodipine, Atorvastatin, Crestor [rosuvastatin calcium],  Ezetimibe-simvastatin, Statins, Celebrex [celecoxib], and Levemir [insulin detemir]   Social History:  The patient  reports that she has never smoked. She has never used smokeless tobacco. She reports current alcohol use. She reports that she does not use drugs.   Family History:  The patient's family history includes Colitis in her mother; Colon polyps in her father; Diabetes in her father, maternal grandfather, mother, and another family member; Heart attack in her sister; Heart disease in her father; Heart failure in her father and mother; Hypertension in her mother; Kidney Stones in her mother; Skin cancer in her mother; Stroke in her maternal grandfather and mother.   ROS:  Please see the history of present illness.   All other systems are personally reviewed and negative.   Exam:   BP Marland Kitchen)  156/53   Pulse 78   Wt 91.8 kg (202 lb 6.4 oz)   SpO2 99%   BMI 31.70 kg/m  General: NAD Neck: No JVD, no thyromegaly or thyroid nodule.  Lungs: Clear to auscultation bilaterally with normal respiratory effort. CV: Nondisplaced PMI.  Heart regular S1/S2, no S3/S4, no murmur.  No peripheral edema.  No carotid bruit.  Unable to palpate pedal pulses.  Abdomen: Soft, nontender, no hepatosplenomegaly, no distention.  Skin: Intact without lesions or rashes.  Neurologic: Alert and oriented x 3.  Psych: Normal affect. Extremities: No clubbing or cyanosis.  HEENT: Normal.   Recent Labs: 09/05/2018: BNP 116.6 04/23/2019: Magnesium 2.0 07/01/2019: BUN 67; Creatinine, Ser 2.48; Hemoglobin 14.2; Platelets 202; Potassium 3.8; Sodium 145 07/22/2019: ALT 13  Personally reviewed   Wt Readings from Last 3 Encounters:  08/14/19 91.8 kg (202 lb 6.4 oz)  07/01/19 91.6 kg (202 lb)  06/14/19 92.7 kg (204 lb 6.4 oz)      ASSESSMENT AND PLAN:  1. Chronic diastolic CHF: TEE in 17/71 showed EF 60-65%, mild LVH, stable bioprosthetic aortic valve s/p TAVR.PYP scan in 2/20 suggestive of transthyretin amyloidosis,  genetic testing suggestive of wild type.  NYHA class II-III symptoms, she is at baseline. She does not look volume overloaded on exam. Creatinine has been higher recently.  - I will have her decrease torsemide to 20 mg bid alternating with 20 mg daily. BMET today.    2. CKD stage 3: Creatinine has been higher recently.  BMET today.   3. CAD: Cath in 8/19 with moderate RCA and LCx disease, severe distal LAD disease, medical management.  No chest pain. - No ASA given apixaban use.  - Continue pitivastatin and Praluent.    - Continue fenofibrate and vascepa. Vascepa was recently added due to elevated triglycerides, will repeat lipids in 1 month.  4. Atrial fibrillation: Paroxysmal. She is in NSR today.  - Continue Eliquis.  5. HTN: BP fluctuates significantly. She has frequent orthostatic symptoms.    - Continue Coreg 25 mg BID. - She cannot take amlodipine due to peripheral edema formation.  - Hold off on ACEI/ARB with CKD.  - Continue hydralazine, I will not increase given lightheadedness.    6. PAD: She has a right heel ulcer followed by podiatry and is now s/p PTCA to right popliteal and right AT in 7/20.  No definite claudication.   - She has followup with vascular surgery at [facility].  7. 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  - She is on tafamidis, needs refill.   8. 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  9. 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    Signed, [name], MD  08/15/2019   [facility] [address] [facility] [place] [address] [phone] (office) [phone] (fax)

## 2019-08-19 DIAGNOSIS — I739 Peripheral vascular disease, unspecified: Secondary | ICD-10-CM | POA: Diagnosis not present

## 2019-08-19 DIAGNOSIS — Z48812 Encounter for surgical aftercare following surgery on the circulatory system: Secondary | ICD-10-CM | POA: Diagnosis not present

## 2019-08-19 DIAGNOSIS — L97412 Non-pressure chronic ulcer of right heel and midfoot with fat layer exposed: Secondary | ICD-10-CM | POA: Diagnosis not present

## 2019-08-20 DIAGNOSIS — Z48812 Encounter for surgical aftercare following surgery on the circulatory system: Secondary | ICD-10-CM | POA: Diagnosis not present

## 2019-08-20 DIAGNOSIS — I739 Peripheral vascular disease, unspecified: Secondary | ICD-10-CM | POA: Diagnosis not present

## 2019-08-20 DIAGNOSIS — L97412 Non-pressure chronic ulcer of right heel and midfoot with fat layer exposed: Secondary | ICD-10-CM | POA: Diagnosis not present

## 2019-08-20 DIAGNOSIS — Z89412 Acquired absence of left great toe: Secondary | ICD-10-CM | POA: Diagnosis not present

## 2019-08-20 DIAGNOSIS — E1151 Type 2 diabetes mellitus with diabetic peripheral angiopathy without gangrene: Secondary | ICD-10-CM | POA: Diagnosis not present

## 2019-09-01 DIAGNOSIS — E8771 Transfusion associated circulatory overload: Secondary | ICD-10-CM | POA: Diagnosis not present

## 2019-09-01 DIAGNOSIS — Z888 Allergy status to other drugs, medicaments and biological substances status: Secondary | ICD-10-CM | POA: Diagnosis not present

## 2019-09-01 DIAGNOSIS — I43 Cardiomyopathy in diseases classified elsewhere: Secondary | ICD-10-CM | POA: Diagnosis present

## 2019-09-01 DIAGNOSIS — N179 Acute kidney failure, unspecified: Secondary | ICD-10-CM | POA: Diagnosis not present

## 2019-09-01 DIAGNOSIS — Z01810 Encounter for preprocedural cardiovascular examination: Secondary | ICD-10-CM | POA: Diagnosis not present

## 2019-09-01 DIAGNOSIS — E11622 Type 2 diabetes mellitus with other skin ulcer: Secondary | ICD-10-CM | POA: Diagnosis not present

## 2019-09-01 DIAGNOSIS — E785 Hyperlipidemia, unspecified: Secondary | ICD-10-CM | POA: Diagnosis not present

## 2019-09-01 DIAGNOSIS — E1149 Type 2 diabetes mellitus with other diabetic neurological complication: Secondary | ICD-10-CM | POA: Diagnosis present

## 2019-09-01 DIAGNOSIS — E1122 Type 2 diabetes mellitus with diabetic chronic kidney disease: Secondary | ICD-10-CM | POA: Diagnosis present

## 2019-09-01 DIAGNOSIS — Z794 Long term (current) use of insulin: Secondary | ICD-10-CM | POA: Diagnosis not present

## 2019-09-01 DIAGNOSIS — Z9111 Patient's noncompliance with dietary regimen: Secondary | ICD-10-CM | POA: Diagnosis not present

## 2019-09-01 DIAGNOSIS — E1152 Type 2 diabetes mellitus with diabetic peripheral angiopathy with gangrene: Secondary | ICD-10-CM | POA: Diagnosis present

## 2019-09-01 DIAGNOSIS — G4730 Sleep apnea, unspecified: Secondary | ICD-10-CM | POA: Diagnosis present

## 2019-09-01 DIAGNOSIS — Z89412 Acquired absence of left great toe: Secondary | ICD-10-CM | POA: Diagnosis not present

## 2019-09-01 DIAGNOSIS — I2510 Atherosclerotic heart disease of native coronary artery without angina pectoris: Secondary | ICD-10-CM | POA: Diagnosis present

## 2019-09-01 DIAGNOSIS — N183 Chronic kidney disease, stage 3 unspecified: Secondary | ICD-10-CM | POA: Diagnosis not present

## 2019-09-01 DIAGNOSIS — Z6833 Body mass index (BMI) 33.0-33.9, adult: Secondary | ICD-10-CM | POA: Diagnosis not present

## 2019-09-01 DIAGNOSIS — I868 Varicose veins of other specified sites: Secondary | ICD-10-CM | POA: Diagnosis not present

## 2019-09-01 DIAGNOSIS — L97521 Non-pressure chronic ulcer of other part of left foot limited to breakdown of skin: Secondary | ICD-10-CM | POA: Diagnosis not present

## 2019-09-01 DIAGNOSIS — L97529 Non-pressure chronic ulcer of other part of left foot with unspecified severity: Secondary | ICD-10-CM | POA: Diagnosis present

## 2019-09-01 DIAGNOSIS — Z6830 Body mass index (BMI) 30.0-30.9, adult: Secondary | ICD-10-CM | POA: Diagnosis not present

## 2019-09-01 DIAGNOSIS — E11628 Type 2 diabetes mellitus with other skin complications: Secondary | ICD-10-CM | POA: Diagnosis not present

## 2019-09-01 DIAGNOSIS — I739 Peripheral vascular disease, unspecified: Secondary | ICD-10-CM | POA: Diagnosis not present

## 2019-09-01 DIAGNOSIS — E854 Organ-limited amyloidosis: Secondary | ICD-10-CM | POA: Diagnosis present

## 2019-09-01 DIAGNOSIS — M86172 Other acute osteomyelitis, left ankle and foot: Secondary | ICD-10-CM | POA: Diagnosis not present

## 2019-09-01 DIAGNOSIS — Z8673 Personal history of transient ischemic attack (TIA), and cerebral infarction without residual deficits: Secondary | ICD-10-CM | POA: Diagnosis not present

## 2019-09-01 DIAGNOSIS — I130 Hypertensive heart and chronic kidney disease with heart failure and stage 1 through stage 4 chronic kidney disease, or unspecified chronic kidney disease: Secondary | ICD-10-CM | POA: Diagnosis present

## 2019-09-01 DIAGNOSIS — R0602 Shortness of breath: Secondary | ICD-10-CM | POA: Diagnosis not present

## 2019-09-01 DIAGNOSIS — I5032 Chronic diastolic (congestive) heart failure: Secondary | ICD-10-CM | POA: Diagnosis present

## 2019-09-01 DIAGNOSIS — E11621 Type 2 diabetes mellitus with foot ulcer: Secondary | ICD-10-CM | POA: Diagnosis present

## 2019-09-01 DIAGNOSIS — E669 Obesity, unspecified: Secondary | ICD-10-CM | POA: Diagnosis present

## 2019-09-01 DIAGNOSIS — I82811 Embolism and thrombosis of superficial veins of right lower extremities: Secondary | ICD-10-CM | POA: Diagnosis not present

## 2019-09-01 DIAGNOSIS — I480 Paroxysmal atrial fibrillation: Secondary | ICD-10-CM | POA: Diagnosis present

## 2019-09-01 DIAGNOSIS — I10 Essential (primary) hypertension: Secondary | ICD-10-CM | POA: Diagnosis not present

## 2019-09-01 DIAGNOSIS — I96 Gangrene, not elsewhere classified: Secondary | ICD-10-CM | POA: Diagnosis not present

## 2019-09-01 DIAGNOSIS — I517 Cardiomegaly: Secondary | ICD-10-CM | POA: Diagnosis not present

## 2019-09-01 DIAGNOSIS — N184 Chronic kidney disease, stage 4 (severe): Secondary | ICD-10-CM | POA: Diagnosis present

## 2019-09-01 DIAGNOSIS — L089 Local infection of the skin and subcutaneous tissue, unspecified: Secondary | ICD-10-CM | POA: Diagnosis not present

## 2019-09-01 DIAGNOSIS — Z79899 Other long term (current) drug therapy: Secondary | ICD-10-CM | POA: Diagnosis not present

## 2019-09-01 DIAGNOSIS — I70245 Atherosclerosis of native arteries of left leg with ulceration of other part of foot: Secondary | ICD-10-CM | POA: Diagnosis present

## 2019-09-01 DIAGNOSIS — I129 Hypertensive chronic kidney disease with stage 1 through stage 4 chronic kidney disease, or unspecified chronic kidney disease: Secondary | ICD-10-CM | POA: Diagnosis not present

## 2019-09-01 DIAGNOSIS — Z6831 Body mass index (BMI) 31.0-31.9, adult: Secondary | ICD-10-CM | POA: Diagnosis not present

## 2019-09-01 DIAGNOSIS — Z043 Encounter for examination and observation following other accident: Secondary | ICD-10-CM | POA: Diagnosis not present

## 2019-09-01 DIAGNOSIS — E1151 Type 2 diabetes mellitus with diabetic peripheral angiopathy without gangrene: Secondary | ICD-10-CM | POA: Diagnosis not present

## 2019-09-01 DIAGNOSIS — E1165 Type 2 diabetes mellitus with hyperglycemia: Secondary | ICD-10-CM | POA: Diagnosis not present

## 2019-09-16 ENCOUNTER — Other Ambulatory Visit (HOSPITAL_COMMUNITY): Payer: Medicare Other

## 2019-09-18 ENCOUNTER — Telehealth: Payer: Self-pay | Admitting: Family Medicine

## 2019-09-18 MED ORDER — GENERIC EXTERNAL MEDICATION
Status: DC
Start: ? — End: 2019-09-18

## 2019-09-18 MED ORDER — ACETAMINOPHEN 325 MG PO TABS
650.00 | ORAL_TABLET | ORAL | Status: DC
Start: 2019-09-18 — End: 2019-09-18

## 2019-09-18 MED ORDER — HEPARIN SODIUM (PORCINE) 5000 UNIT/ML IJ SOLN
15.00 | INTRAMUSCULAR | Status: DC
Start: ? — End: 2019-09-18

## 2019-09-18 MED ORDER — TORSEMIDE 20 MG PO TABS
20.00 | ORAL_TABLET | ORAL | Status: DC
Start: 2019-09-19 — End: 2019-09-18

## 2019-09-18 MED ORDER — ALUM & MAG HYDROXIDE-SIMETH 200-200-20 MG/5ML PO SUSP
30.00 | ORAL | Status: DC
Start: ? — End: 2019-09-18

## 2019-09-18 MED ORDER — CARVEDILOL 25 MG PO TABS
25.00 | ORAL_TABLET | ORAL | Status: DC
Start: 2019-09-18 — End: 2019-09-18

## 2019-09-18 MED ORDER — HYDRALAZINE HCL 10 MG PO TABS
10.00 | ORAL_TABLET | ORAL | Status: DC
Start: 2019-09-18 — End: 2019-09-18

## 2019-09-18 MED ORDER — HYDROMORPHONE HCL 1 MG/ML IJ SOLN
0.50 | INTRAMUSCULAR | Status: DC
Start: ? — End: 2019-09-18

## 2019-09-18 MED ORDER — APIXABAN 5 MG PO TABS
5.00 | ORAL_TABLET | ORAL | Status: DC
Start: 2019-09-18 — End: 2019-09-18

## 2019-09-18 MED ORDER — PHENOL 1.4 % MT LIQD
1.00 | OROMUCOSAL | Status: DC
Start: ? — End: 2019-09-18

## 2019-09-18 MED ORDER — FISH OIL 1000 MG PO CAPS
1.00 | ORAL_CAPSULE | ORAL | Status: DC
Start: 2019-09-19 — End: 2019-09-18

## 2019-09-18 MED ORDER — ACETAMINOPHEN 650 MG RE SUPP
650.00 | RECTAL | Status: DC
Start: ? — End: 2019-09-18

## 2019-09-18 MED ORDER — HYDROMORPHONE HCL 1 MG/ML IJ SOLN
0.20 | INTRAMUSCULAR | Status: DC
Start: ? — End: 2019-09-18

## 2019-09-18 MED ORDER — GENERIC EXTERNAL MEDICATION
0.04 | Status: DC
Start: ? — End: 2019-09-18

## 2019-09-18 MED ORDER — ACETAMINOPHEN 325 MG PO TABS
650.00 | ORAL_TABLET | ORAL | Status: DC
Start: ? — End: 2019-09-18

## 2019-09-18 MED ORDER — INSULIN LISPRO 100 UNIT/ML ~~LOC~~ SOLN
1.00 | SUBCUTANEOUS | Status: DC
Start: 2019-09-18 — End: 2019-09-18

## 2019-09-18 MED ORDER — HYDROCODONE-ACETAMINOPHEN 5-325 MG PO TABS
1.00 | ORAL_TABLET | ORAL | Status: DC
Start: ? — End: 2019-09-18

## 2019-09-18 MED ORDER — INSULIN GLARGINE 100 UNIT/ML ~~LOC~~ SOLN
1.00 | SUBCUTANEOUS | Status: DC
Start: ? — End: 2019-09-18

## 2019-09-18 MED ORDER — INSULIN GLARGINE 100 UNIT/ML ~~LOC~~ SOLN
1.00 | SUBCUTANEOUS | Status: DC
Start: 2019-09-18 — End: 2019-09-18

## 2019-09-18 MED ORDER — INSULIN LISPRO 100 UNIT/ML ~~LOC~~ SOLN
1.00 | SUBCUTANEOUS | Status: DC
Start: ? — End: 2019-09-18

## 2019-09-18 NOTE — Telephone Encounter (Signed)
VO given for PT & OT

## 2019-09-19 ENCOUNTER — Other Ambulatory Visit: Payer: Self-pay | Admitting: Family Medicine

## 2019-09-19 DIAGNOSIS — Z9181 History of falling: Secondary | ICD-10-CM | POA: Diagnosis not present

## 2019-09-19 DIAGNOSIS — Z48812 Encounter for surgical aftercare following surgery on the circulatory system: Secondary | ICD-10-CM | POA: Diagnosis not present

## 2019-09-19 DIAGNOSIS — I130 Hypertensive heart and chronic kidney disease with heart failure and stage 1 through stage 4 chronic kidney disease, or unspecified chronic kidney disease: Secondary | ICD-10-CM | POA: Diagnosis not present

## 2019-09-19 DIAGNOSIS — E11628 Type 2 diabetes mellitus with other skin complications: Secondary | ICD-10-CM | POA: Diagnosis not present

## 2019-09-19 DIAGNOSIS — G4730 Sleep apnea, unspecified: Secondary | ICD-10-CM | POA: Diagnosis not present

## 2019-09-19 DIAGNOSIS — E785 Hyperlipidemia, unspecified: Secondary | ICD-10-CM | POA: Diagnosis not present

## 2019-09-19 DIAGNOSIS — E1140 Type 2 diabetes mellitus with diabetic neuropathy, unspecified: Secondary | ICD-10-CM | POA: Diagnosis not present

## 2019-09-19 DIAGNOSIS — I5032 Chronic diastolic (congestive) heart failure: Secondary | ICD-10-CM | POA: Diagnosis not present

## 2019-09-19 DIAGNOSIS — E113293 Type 2 diabetes mellitus with mild nonproliferative diabetic retinopathy without macular edema, bilateral: Secondary | ICD-10-CM | POA: Diagnosis not present

## 2019-09-19 DIAGNOSIS — E1151 Type 2 diabetes mellitus with diabetic peripheral angiopathy without gangrene: Secondary | ICD-10-CM | POA: Diagnosis not present

## 2019-09-19 DIAGNOSIS — H903 Sensorineural hearing loss, bilateral: Secondary | ICD-10-CM | POA: Diagnosis not present

## 2019-09-19 DIAGNOSIS — I480 Paroxysmal atrial fibrillation: Secondary | ICD-10-CM | POA: Diagnosis not present

## 2019-09-19 DIAGNOSIS — Z4781 Encounter for orthopedic aftercare following surgical amputation: Secondary | ICD-10-CM | POA: Diagnosis not present

## 2019-09-19 DIAGNOSIS — I2510 Atherosclerotic heart disease of native coronary artery without angina pectoris: Secondary | ICD-10-CM | POA: Diagnosis not present

## 2019-09-19 DIAGNOSIS — H9110 Presbycusis, unspecified ear: Secondary | ICD-10-CM | POA: Diagnosis not present

## 2019-09-19 DIAGNOSIS — M86172 Other acute osteomyelitis, left ankle and foot: Secondary | ICD-10-CM | POA: Diagnosis not present

## 2019-09-19 DIAGNOSIS — E669 Obesity, unspecified: Secondary | ICD-10-CM | POA: Diagnosis not present

## 2019-09-19 DIAGNOSIS — E1169 Type 2 diabetes mellitus with other specified complication: Secondary | ICD-10-CM | POA: Diagnosis not present

## 2019-09-19 DIAGNOSIS — K222 Esophageal obstruction: Secondary | ICD-10-CM | POA: Diagnosis not present

## 2019-09-19 DIAGNOSIS — E854 Organ-limited amyloidosis: Secondary | ICD-10-CM | POA: Diagnosis not present

## 2019-09-19 DIAGNOSIS — I70209 Unspecified atherosclerosis of native arteries of extremities, unspecified extremity: Secondary | ICD-10-CM | POA: Diagnosis not present

## 2019-09-19 DIAGNOSIS — E1122 Type 2 diabetes mellitus with diabetic chronic kidney disease: Secondary | ICD-10-CM | POA: Diagnosis not present

## 2019-09-19 DIAGNOSIS — Z7901 Long term (current) use of anticoagulants: Secondary | ICD-10-CM | POA: Diagnosis not present

## 2019-09-19 DIAGNOSIS — I43 Cardiomyopathy in diseases classified elsewhere: Secondary | ICD-10-CM | POA: Diagnosis not present

## 2019-09-19 DIAGNOSIS — N184 Chronic kidney disease, stage 4 (severe): Secondary | ICD-10-CM | POA: Diagnosis not present

## 2019-09-19 DIAGNOSIS — M1990 Unspecified osteoarthritis, unspecified site: Secondary | ICD-10-CM | POA: Diagnosis not present

## 2019-09-19 MED ORDER — GENERIC EXTERNAL MEDICATION
Status: DC
Start: ? — End: 2019-09-19

## 2019-09-20 DIAGNOSIS — E1151 Type 2 diabetes mellitus with diabetic peripheral angiopathy without gangrene: Secondary | ICD-10-CM | POA: Diagnosis not present

## 2019-09-20 DIAGNOSIS — I70209 Unspecified atherosclerosis of native arteries of extremities, unspecified extremity: Secondary | ICD-10-CM | POA: Diagnosis not present

## 2019-09-20 DIAGNOSIS — I130 Hypertensive heart and chronic kidney disease with heart failure and stage 1 through stage 4 chronic kidney disease, or unspecified chronic kidney disease: Secondary | ICD-10-CM | POA: Diagnosis not present

## 2019-09-20 DIAGNOSIS — Z4781 Encounter for orthopedic aftercare following surgical amputation: Secondary | ICD-10-CM | POA: Diagnosis not present

## 2019-09-20 DIAGNOSIS — E1169 Type 2 diabetes mellitus with other specified complication: Secondary | ICD-10-CM | POA: Diagnosis not present

## 2019-09-20 DIAGNOSIS — M86172 Other acute osteomyelitis, left ankle and foot: Secondary | ICD-10-CM | POA: Diagnosis not present

## 2019-09-20 MED ORDER — GENERIC EXTERNAL MEDICATION
Status: DC
Start: ? — End: 2019-09-20

## 2019-09-23 ENCOUNTER — Telehealth (HOSPITAL_COMMUNITY): Payer: Self-pay | Admitting: Pharmacy Technician

## 2019-09-23 NOTE — Telephone Encounter (Signed)
Patient called in wanting to make sure where to get her Vyndamax filled at.  Called her to go ahead and set up another refill with the specialty pharmacy.  Had to leave a message.  Charlann Boxer, CPhT

## 2019-09-24 ENCOUNTER — Telehealth (HOSPITAL_COMMUNITY): Payer: Self-pay

## 2019-09-24 ENCOUNTER — Telehealth: Payer: Self-pay | Admitting: *Deleted

## 2019-09-24 DIAGNOSIS — E1169 Type 2 diabetes mellitus with other specified complication: Secondary | ICD-10-CM | POA: Diagnosis not present

## 2019-09-24 DIAGNOSIS — E1151 Type 2 diabetes mellitus with diabetic peripheral angiopathy without gangrene: Secondary | ICD-10-CM | POA: Diagnosis not present

## 2019-09-24 DIAGNOSIS — I70209 Unspecified atherosclerosis of native arteries of extremities, unspecified extremity: Secondary | ICD-10-CM | POA: Diagnosis not present

## 2019-09-24 DIAGNOSIS — Z4781 Encounter for orthopedic aftercare following surgical amputation: Secondary | ICD-10-CM | POA: Diagnosis not present

## 2019-09-24 DIAGNOSIS — I130 Hypertensive heart and chronic kidney disease with heart failure and stage 1 through stage 4 chronic kidney disease, or unspecified chronic kidney disease: Secondary | ICD-10-CM | POA: Diagnosis not present

## 2019-09-24 DIAGNOSIS — M86172 Other acute osteomyelitis, left ankle and foot: Secondary | ICD-10-CM | POA: Diagnosis not present

## 2019-09-24 NOTE — Telephone Encounter (Signed)
Rx for blood work (lipid) mailed to patient. Pt reports she was hosplitalized and missed her appt for blood work.  She will go locally to have them done at [facility]. Pt aware rx will be mailed.

## 2019-09-24 NOTE — Telephone Encounter (Signed)
I have spoken with thepatient and she has asked that an order be sent to [facility] in [place] for the lab appt she missed with [place] on 2/1 due to hospitalization.   I have sent a message to triage for this request and asked them to please contact the patient at [phone] and advise once the order has been sent over.

## 2019-09-24 NOTE — Telephone Encounter (Signed)
LMOVM for [name] that pt's diabetes is handled by Dr. Chalmers Cater and left provider's phone number on her VM

## 2019-09-24 NOTE — Telephone Encounter (Signed)
Her diabetic regimen is handled by her endocrinologist, Dr Chalmers Cater.

## 2019-09-24 NOTE — Telephone Encounter (Signed)
VM from [place] w/ [place] Needing clarification on sliding scale insuling

## 2019-09-25 DIAGNOSIS — I70209 Unspecified atherosclerosis of native arteries of extremities, unspecified extremity: Secondary | ICD-10-CM | POA: Diagnosis not present

## 2019-09-25 DIAGNOSIS — I130 Hypertensive heart and chronic kidney disease with heart failure and stage 1 through stage 4 chronic kidney disease, or unspecified chronic kidney disease: Secondary | ICD-10-CM | POA: Diagnosis not present

## 2019-09-25 DIAGNOSIS — E1169 Type 2 diabetes mellitus with other specified complication: Secondary | ICD-10-CM | POA: Diagnosis not present

## 2019-09-25 DIAGNOSIS — Z4781 Encounter for orthopedic aftercare following surgical amputation: Secondary | ICD-10-CM | POA: Diagnosis not present

## 2019-09-25 DIAGNOSIS — M86172 Other acute osteomyelitis, left ankle and foot: Secondary | ICD-10-CM | POA: Diagnosis not present

## 2019-09-25 DIAGNOSIS — E1151 Type 2 diabetes mellitus with diabetic peripheral angiopathy without gangrene: Secondary | ICD-10-CM | POA: Diagnosis not present

## 2019-09-25 MED FILL — VYNDAMAX 61 MG CAPS: 61 | 30 days supply | Qty: 30 | Fill #1

## 2019-09-27 NOTE — Telephone Encounter (Signed)
Spoke with patient. Shipped medication to her. She is aware how to get her next refill.  Charlann Boxer, CPhT

## 2019-09-30 DIAGNOSIS — I739 Peripheral vascular disease, unspecified: Secondary | ICD-10-CM | POA: Diagnosis not present

## 2019-09-30 DIAGNOSIS — I10 Essential (primary) hypertension: Secondary | ICD-10-CM | POA: Diagnosis not present

## 2019-09-30 DIAGNOSIS — L97412 Non-pressure chronic ulcer of right heel and midfoot with fat layer exposed: Secondary | ICD-10-CM | POA: Diagnosis not present

## 2019-09-30 DIAGNOSIS — Z89422 Acquired absence of other left toe(s): Secondary | ICD-10-CM | POA: Diagnosis not present

## 2019-09-30 DIAGNOSIS — E1142 Type 2 diabetes mellitus with diabetic polyneuropathy: Secondary | ICD-10-CM | POA: Diagnosis not present

## 2019-10-02 ENCOUNTER — Ambulatory Visit: Payer: Medicare Other | Admitting: Family Medicine

## 2019-10-02 ENCOUNTER — Other Ambulatory Visit: Payer: Self-pay

## 2019-10-02 ENCOUNTER — Ambulatory Visit (INDEPENDENT_AMBULATORY_CARE_PROVIDER_SITE_OTHER): Payer: Medicare Other

## 2019-10-02 DIAGNOSIS — E1122 Type 2 diabetes mellitus with diabetic chronic kidney disease: Secondary | ICD-10-CM

## 2019-10-02 DIAGNOSIS — E1140 Type 2 diabetes mellitus with diabetic neuropathy, unspecified: Secondary | ICD-10-CM

## 2019-10-02 DIAGNOSIS — I70209 Unspecified atherosclerosis of native arteries of extremities, unspecified extremity: Secondary | ICD-10-CM

## 2019-10-02 DIAGNOSIS — H903 Sensorineural hearing loss, bilateral: Secondary | ICD-10-CM

## 2019-10-02 DIAGNOSIS — Z794 Long term (current) use of insulin: Secondary | ICD-10-CM

## 2019-10-02 DIAGNOSIS — Z48812 Encounter for surgical aftercare following surgery on the circulatory system: Secondary | ICD-10-CM

## 2019-10-02 DIAGNOSIS — Z9181 History of falling: Secondary | ICD-10-CM

## 2019-10-02 DIAGNOSIS — E1169 Type 2 diabetes mellitus with other specified complication: Secondary | ICD-10-CM | POA: Diagnosis not present

## 2019-10-02 DIAGNOSIS — I2510 Atherosclerotic heart disease of native coronary artery without angina pectoris: Secondary | ICD-10-CM

## 2019-10-02 DIAGNOSIS — E669 Obesity, unspecified: Secondary | ICD-10-CM

## 2019-10-02 DIAGNOSIS — Z4781 Encounter for orthopedic aftercare following surgical amputation: Secondary | ICD-10-CM

## 2019-10-02 DIAGNOSIS — G4730 Sleep apnea, unspecified: Secondary | ICD-10-CM

## 2019-10-02 DIAGNOSIS — E785 Hyperlipidemia, unspecified: Secondary | ICD-10-CM

## 2019-10-02 DIAGNOSIS — N184 Chronic kidney disease, stage 4 (severe): Secondary | ICD-10-CM | POA: Diagnosis not present

## 2019-10-02 DIAGNOSIS — Z7901 Long term (current) use of anticoagulants: Secondary | ICD-10-CM

## 2019-10-02 DIAGNOSIS — E113293 Type 2 diabetes mellitus with mild nonproliferative diabetic retinopathy without macular edema, bilateral: Secondary | ICD-10-CM

## 2019-10-02 DIAGNOSIS — H9110 Presbycusis, unspecified ear: Secondary | ICD-10-CM

## 2019-10-02 DIAGNOSIS — I480 Paroxysmal atrial fibrillation: Secondary | ICD-10-CM

## 2019-10-02 DIAGNOSIS — I5032 Chronic diastolic (congestive) heart failure: Secondary | ICD-10-CM | POA: Diagnosis not present

## 2019-10-02 DIAGNOSIS — Z9884 Bariatric surgery status: Secondary | ICD-10-CM

## 2019-10-02 DIAGNOSIS — Z952 Presence of prosthetic heart valve: Secondary | ICD-10-CM

## 2019-10-02 DIAGNOSIS — I130 Hypertensive heart and chronic kidney disease with heart failure and stage 1 through stage 4 chronic kidney disease, or unspecified chronic kidney disease: Secondary | ICD-10-CM | POA: Diagnosis not present

## 2019-10-02 DIAGNOSIS — M86172 Other acute osteomyelitis, left ankle and foot: Secondary | ICD-10-CM | POA: Diagnosis not present

## 2019-10-02 DIAGNOSIS — E1151 Type 2 diabetes mellitus with diabetic peripheral angiopathy without gangrene: Secondary | ICD-10-CM

## 2019-10-02 DIAGNOSIS — Z8673 Personal history of transient ischemic attack (TIA), and cerebral infarction without residual deficits: Secondary | ICD-10-CM

## 2019-10-02 DIAGNOSIS — I43 Cardiomyopathy in diseases classified elsewhere: Secondary | ICD-10-CM

## 2019-10-02 DIAGNOSIS — Z86718 Personal history of other venous thrombosis and embolism: Secondary | ICD-10-CM

## 2019-10-02 DIAGNOSIS — K222 Esophageal obstruction: Secondary | ICD-10-CM

## 2019-10-02 DIAGNOSIS — Z95820 Peripheral vascular angioplasty status with implants and grafts: Secondary | ICD-10-CM

## 2019-10-02 DIAGNOSIS — M1990 Unspecified osteoarthritis, unspecified site: Secondary | ICD-10-CM

## 2019-10-02 DIAGNOSIS — E854 Organ-limited amyloidosis: Secondary | ICD-10-CM

## 2019-10-03 DIAGNOSIS — I130 Hypertensive heart and chronic kidney disease with heart failure and stage 1 through stage 4 chronic kidney disease, or unspecified chronic kidney disease: Secondary | ICD-10-CM | POA: Diagnosis not present

## 2019-10-03 DIAGNOSIS — I70209 Unspecified atherosclerosis of native arteries of extremities, unspecified extremity: Secondary | ICD-10-CM | POA: Diagnosis not present

## 2019-10-03 DIAGNOSIS — Z4781 Encounter for orthopedic aftercare following surgical amputation: Secondary | ICD-10-CM | POA: Diagnosis not present

## 2019-10-03 DIAGNOSIS — E1151 Type 2 diabetes mellitus with diabetic peripheral angiopathy without gangrene: Secondary | ICD-10-CM | POA: Diagnosis not present

## 2019-10-03 DIAGNOSIS — M86172 Other acute osteomyelitis, left ankle and foot: Secondary | ICD-10-CM | POA: Diagnosis not present

## 2019-10-03 DIAGNOSIS — E1169 Type 2 diabetes mellitus with other specified complication: Secondary | ICD-10-CM | POA: Diagnosis not present

## 2019-10-04 ENCOUNTER — Other Ambulatory Visit: Payer: Self-pay | Admitting: Pharmacist Clinician (PhC)/ Clinical Pharmacy Specialist

## 2019-10-04 MED ORDER — APIXABAN 5 MG PO TABS: 5.0000 mg | ORAL_TABLET | Freq: Two times a day (BID) | ORAL | 1 refills | Status: DC

## 2019-10-04 NOTE — Telephone Encounter (Signed)
68 F 91.6 kg, SCr 2.48 (06/2019), LOV Hochrein 05/2019

## 2019-10-08 ENCOUNTER — Other Ambulatory Visit: Payer: Self-pay

## 2019-10-08 ENCOUNTER — Other Ambulatory Visit: Payer: Self-pay | Admitting: Cardiology

## 2019-10-08 ENCOUNTER — Other Ambulatory Visit: Payer: Medicare Other

## 2019-10-08 DIAGNOSIS — I10 Essential (primary) hypertension: Secondary | ICD-10-CM | POA: Diagnosis not present

## 2019-10-08 DIAGNOSIS — E1169 Type 2 diabetes mellitus with other specified complication: Secondary | ICD-10-CM | POA: Diagnosis not present

## 2019-10-08 DIAGNOSIS — I739 Peripheral vascular disease, unspecified: Secondary | ICD-10-CM | POA: Diagnosis not present

## 2019-10-08 DIAGNOSIS — L97412 Non-pressure chronic ulcer of right heel and midfoot with fat layer exposed: Secondary | ICD-10-CM | POA: Diagnosis not present

## 2019-10-08 DIAGNOSIS — Z89422 Acquired absence of other left toe(s): Secondary | ICD-10-CM | POA: Diagnosis not present

## 2019-10-08 DIAGNOSIS — T8189XD Other complications of procedures, not elsewhere classified, subsequent encounter: Secondary | ICD-10-CM | POA: Diagnosis not present

## 2019-10-08 DIAGNOSIS — R0989 Other specified symptoms and signs involving the circulatory and respiratory systems: Secondary | ICD-10-CM | POA: Diagnosis not present

## 2019-10-09 LAB — LIPID PANEL W/O CHOL/HDL RATIO
Cholesterol, Total: 107 mg/dL (ref 100–199)
HDL: 36 mg/dL — ABNORMAL LOW (ref >39)
LDL Chol Calc (NIH): 35 mg/dL (ref 0–99)
Triglycerides: 229 mg/dL — ABNORMAL HIGH (ref 0–149)
VLDL Cholesterol Cal: 36 mg/dL (ref 5–40)

## 2019-10-10 DIAGNOSIS — Z01818 Encounter for other preprocedural examination: Secondary | ICD-10-CM | POA: Diagnosis not present

## 2019-10-11 ENCOUNTER — Ambulatory Visit: Payer: Medicare Other | Admitting: Family Medicine

## 2019-10-11 DIAGNOSIS — E785 Hyperlipidemia, unspecified: Secondary | ICD-10-CM | POA: Diagnosis not present

## 2019-10-11 DIAGNOSIS — Z8673 Personal history of transient ischemic attack (TIA), and cerebral infarction without residual deficits: Secondary | ICD-10-CM | POA: Diagnosis not present

## 2019-10-11 DIAGNOSIS — E1169 Type 2 diabetes mellitus with other specified complication: Secondary | ICD-10-CM | POA: Diagnosis not present

## 2019-10-11 DIAGNOSIS — Z7901 Long term (current) use of anticoagulants: Secondary | ICD-10-CM | POA: Diagnosis not present

## 2019-10-11 DIAGNOSIS — Z886 Allergy status to analgesic agent status: Secondary | ICD-10-CM | POA: Diagnosis not present

## 2019-10-11 DIAGNOSIS — T8189XA Other complications of procedures, not elsewhere classified, initial encounter: Secondary | ICD-10-CM | POA: Diagnosis not present

## 2019-10-11 DIAGNOSIS — L97529 Non-pressure chronic ulcer of other part of left foot with unspecified severity: Secondary | ICD-10-CM | POA: Diagnosis not present

## 2019-10-11 DIAGNOSIS — I350 Nonrheumatic aortic (valve) stenosis: Secondary | ICD-10-CM | POA: Diagnosis not present

## 2019-10-11 DIAGNOSIS — M86172 Other acute osteomyelitis, left ankle and foot: Secondary | ICD-10-CM | POA: Diagnosis not present

## 2019-10-11 DIAGNOSIS — N183 Chronic kidney disease, stage 3 unspecified: Secondary | ICD-10-CM | POA: Diagnosis not present

## 2019-10-11 DIAGNOSIS — Z794 Long term (current) use of insulin: Secondary | ICD-10-CM | POA: Diagnosis not present

## 2019-10-11 DIAGNOSIS — Z888 Allergy status to other drugs, medicaments and biological substances status: Secondary | ICD-10-CM | POA: Diagnosis not present

## 2019-10-11 DIAGNOSIS — N179 Acute kidney failure, unspecified: Secondary | ICD-10-CM | POA: Diagnosis not present

## 2019-10-11 DIAGNOSIS — E11621 Type 2 diabetes mellitus with foot ulcer: Secondary | ICD-10-CM | POA: Diagnosis not present

## 2019-10-11 DIAGNOSIS — I509 Heart failure, unspecified: Secondary | ICD-10-CM | POA: Diagnosis not present

## 2019-10-11 DIAGNOSIS — L97419 Non-pressure chronic ulcer of right heel and midfoot with unspecified severity: Secondary | ICD-10-CM | POA: Diagnosis not present

## 2019-10-11 DIAGNOSIS — E1122 Type 2 diabetes mellitus with diabetic chronic kidney disease: Secondary | ICD-10-CM | POA: Diagnosis not present

## 2019-10-11 DIAGNOSIS — Z79899 Other long term (current) drug therapy: Secondary | ICD-10-CM | POA: Diagnosis not present

## 2019-10-11 DIAGNOSIS — G4730 Sleep apnea, unspecified: Secondary | ICD-10-CM | POA: Diagnosis not present

## 2019-10-11 DIAGNOSIS — E1151 Type 2 diabetes mellitus with diabetic peripheral angiopathy without gangrene: Secondary | ICD-10-CM | POA: Diagnosis not present

## 2019-10-11 DIAGNOSIS — I2510 Atherosclerotic heart disease of native coronary artery without angina pectoris: Secondary | ICD-10-CM | POA: Diagnosis not present

## 2019-10-11 DIAGNOSIS — Z89422 Acquired absence of other left toe(s): Secondary | ICD-10-CM | POA: Diagnosis not present

## 2019-10-11 DIAGNOSIS — K219 Gastro-esophageal reflux disease without esophagitis: Secondary | ICD-10-CM | POA: Diagnosis not present

## 2019-10-11 DIAGNOSIS — Z86718 Personal history of other venous thrombosis and embolism: Secondary | ICD-10-CM | POA: Diagnosis not present

## 2019-10-11 DIAGNOSIS — I130 Hypertensive heart and chronic kidney disease with heart failure and stage 1 through stage 4 chronic kidney disease, or unspecified chronic kidney disease: Secondary | ICD-10-CM | POA: Diagnosis not present

## 2019-10-14 ENCOUNTER — Telehealth: Payer: Self-pay | Admitting: Family Medicine

## 2019-10-14 NOTE — Telephone Encounter (Signed)
Appointment scheduled 10/23/2019 with Dr. Lajuana Ripple, patient aware.

## 2019-10-16 DIAGNOSIS — M86172 Other acute osteomyelitis, left ankle and foot: Secondary | ICD-10-CM | POA: Diagnosis not present

## 2019-10-16 DIAGNOSIS — E1169 Type 2 diabetes mellitus with other specified complication: Secondary | ICD-10-CM | POA: Diagnosis not present

## 2019-10-16 DIAGNOSIS — Z4781 Encounter for orthopedic aftercare following surgical amputation: Secondary | ICD-10-CM | POA: Diagnosis not present

## 2019-10-16 DIAGNOSIS — E1151 Type 2 diabetes mellitus with diabetic peripheral angiopathy without gangrene: Secondary | ICD-10-CM | POA: Diagnosis not present

## 2019-10-16 DIAGNOSIS — I70209 Unspecified atherosclerosis of native arteries of extremities, unspecified extremity: Secondary | ICD-10-CM | POA: Diagnosis not present

## 2019-10-16 DIAGNOSIS — I130 Hypertensive heart and chronic kidney disease with heart failure and stage 1 through stage 4 chronic kidney disease, or unspecified chronic kidney disease: Secondary | ICD-10-CM | POA: Diagnosis not present

## 2019-10-17 DIAGNOSIS — I739 Peripheral vascular disease, unspecified: Secondary | ICD-10-CM | POA: Diagnosis not present

## 2019-10-17 DIAGNOSIS — Z48812 Encounter for surgical aftercare following surgery on the circulatory system: Secondary | ICD-10-CM | POA: Diagnosis not present

## 2019-10-18 ENCOUNTER — Other Ambulatory Visit: Payer: Self-pay

## 2019-10-18 ENCOUNTER — Other Ambulatory Visit: Payer: Medicare Other

## 2019-10-18 DIAGNOSIS — N184 Chronic kidney disease, stage 4 (severe): Secondary | ICD-10-CM | POA: Diagnosis not present

## 2019-10-19 DIAGNOSIS — I43 Cardiomyopathy in diseases classified elsewhere: Secondary | ICD-10-CM | POA: Diagnosis not present

## 2019-10-19 DIAGNOSIS — I2510 Atherosclerotic heart disease of native coronary artery without angina pectoris: Secondary | ICD-10-CM | POA: Diagnosis not present

## 2019-10-19 DIAGNOSIS — E1140 Type 2 diabetes mellitus with diabetic neuropathy, unspecified: Secondary | ICD-10-CM | POA: Diagnosis not present

## 2019-10-19 DIAGNOSIS — M86172 Other acute osteomyelitis, left ankle and foot: Secondary | ICD-10-CM | POA: Diagnosis not present

## 2019-10-19 DIAGNOSIS — Z4781 Encounter for orthopedic aftercare following surgical amputation: Secondary | ICD-10-CM | POA: Diagnosis not present

## 2019-10-19 DIAGNOSIS — I130 Hypertensive heart and chronic kidney disease with heart failure and stage 1 through stage 4 chronic kidney disease, or unspecified chronic kidney disease: Secondary | ICD-10-CM | POA: Diagnosis not present

## 2019-10-19 DIAGNOSIS — E669 Obesity, unspecified: Secondary | ICD-10-CM | POA: Diagnosis not present

## 2019-10-19 DIAGNOSIS — I5032 Chronic diastolic (congestive) heart failure: Secondary | ICD-10-CM | POA: Diagnosis not present

## 2019-10-19 DIAGNOSIS — Z23 Encounter for immunization: Secondary | ICD-10-CM | POA: Diagnosis not present

## 2019-10-19 DIAGNOSIS — M1990 Unspecified osteoarthritis, unspecified site: Secondary | ICD-10-CM | POA: Diagnosis not present

## 2019-10-19 DIAGNOSIS — Z7901 Long term (current) use of anticoagulants: Secondary | ICD-10-CM | POA: Diagnosis not present

## 2019-10-19 DIAGNOSIS — E785 Hyperlipidemia, unspecified: Secondary | ICD-10-CM | POA: Diagnosis not present

## 2019-10-19 DIAGNOSIS — I70209 Unspecified atherosclerosis of native arteries of extremities, unspecified extremity: Secondary | ICD-10-CM | POA: Diagnosis not present

## 2019-10-19 DIAGNOSIS — E1151 Type 2 diabetes mellitus with diabetic peripheral angiopathy without gangrene: Secondary | ICD-10-CM | POA: Diagnosis not present

## 2019-10-19 DIAGNOSIS — E1122 Type 2 diabetes mellitus with diabetic chronic kidney disease: Secondary | ICD-10-CM | POA: Diagnosis not present

## 2019-10-19 DIAGNOSIS — H9110 Presbycusis, unspecified ear: Secondary | ICD-10-CM | POA: Diagnosis not present

## 2019-10-19 DIAGNOSIS — E854 Organ-limited amyloidosis: Secondary | ICD-10-CM | POA: Diagnosis not present

## 2019-10-19 DIAGNOSIS — N184 Chronic kidney disease, stage 4 (severe): Secondary | ICD-10-CM | POA: Diagnosis not present

## 2019-10-19 DIAGNOSIS — Z48812 Encounter for surgical aftercare following surgery on the circulatory system: Secondary | ICD-10-CM | POA: Diagnosis not present

## 2019-10-19 DIAGNOSIS — E1169 Type 2 diabetes mellitus with other specified complication: Secondary | ICD-10-CM | POA: Diagnosis not present

## 2019-10-19 DIAGNOSIS — K222 Esophageal obstruction: Secondary | ICD-10-CM | POA: Diagnosis not present

## 2019-10-19 DIAGNOSIS — Z9181 History of falling: Secondary | ICD-10-CM | POA: Diagnosis not present

## 2019-10-19 DIAGNOSIS — H903 Sensorineural hearing loss, bilateral: Secondary | ICD-10-CM | POA: Diagnosis not present

## 2019-10-19 DIAGNOSIS — I480 Paroxysmal atrial fibrillation: Secondary | ICD-10-CM | POA: Diagnosis not present

## 2019-10-19 DIAGNOSIS — G4730 Sleep apnea, unspecified: Secondary | ICD-10-CM | POA: Diagnosis not present

## 2019-10-19 DIAGNOSIS — E113293 Type 2 diabetes mellitus with mild nonproliferative diabetic retinopathy without macular edema, bilateral: Secondary | ICD-10-CM | POA: Diagnosis not present

## 2019-10-21 DIAGNOSIS — Z89432 Acquired absence of left foot: Secondary | ICD-10-CM | POA: Diagnosis not present

## 2019-10-22 DIAGNOSIS — Z48812 Encounter for surgical aftercare following surgery on the circulatory system: Secondary | ICD-10-CM | POA: Diagnosis not present

## 2019-10-22 DIAGNOSIS — E1151 Type 2 diabetes mellitus with diabetic peripheral angiopathy without gangrene: Secondary | ICD-10-CM | POA: Diagnosis not present

## 2019-10-23 ENCOUNTER — Other Ambulatory Visit: Payer: Self-pay

## 2019-10-23 ENCOUNTER — Ambulatory Visit (INDEPENDENT_AMBULATORY_CARE_PROVIDER_SITE_OTHER): Payer: Medicare Other | Admitting: Family Medicine

## 2019-10-23 VITALS — BP 138/57 | HR 64 | Temp 97.6°F

## 2019-10-23 DIAGNOSIS — Z89432 Acquired absence of left foot: Secondary | ICD-10-CM | POA: Diagnosis not present

## 2019-10-23 DIAGNOSIS — Z09 Encounter for follow-up examination after completed treatment for conditions other than malignant neoplasm: Secondary | ICD-10-CM

## 2019-10-23 DIAGNOSIS — I130 Hypertensive heart and chronic kidney disease with heart failure and stage 1 through stage 4 chronic kidney disease, or unspecified chronic kidney disease: Secondary | ICD-10-CM | POA: Diagnosis not present

## 2019-10-23 DIAGNOSIS — I70209 Unspecified atherosclerosis of native arteries of extremities, unspecified extremity: Secondary | ICD-10-CM | POA: Diagnosis not present

## 2019-10-23 DIAGNOSIS — J301 Allergic rhinitis due to pollen: Secondary | ICD-10-CM | POA: Diagnosis not present

## 2019-10-23 DIAGNOSIS — B373 Candidiasis of vulva and vagina: Secondary | ICD-10-CM | POA: Diagnosis not present

## 2019-10-23 DIAGNOSIS — M86172 Other acute osteomyelitis, left ankle and foot: Secondary | ICD-10-CM | POA: Diagnosis not present

## 2019-10-23 DIAGNOSIS — B3731 Acute candidiasis of vulva and vagina: Secondary | ICD-10-CM

## 2019-10-23 DIAGNOSIS — E1169 Type 2 diabetes mellitus with other specified complication: Secondary | ICD-10-CM | POA: Diagnosis not present

## 2019-10-23 DIAGNOSIS — E1151 Type 2 diabetes mellitus with diabetic peripheral angiopathy without gangrene: Secondary | ICD-10-CM | POA: Diagnosis not present

## 2019-10-23 DIAGNOSIS — Z4781 Encounter for orthopedic aftercare following surgical amputation: Secondary | ICD-10-CM | POA: Diagnosis not present

## 2019-10-23 MED ORDER — LORATADINE 10 MG PO TABS: 10.0000 mg | ORAL_TABLET | Freq: Every day | ORAL | 11 refills | Status: DC

## 2019-10-23 MED ORDER — FLUCONAZOLE 150 MG PO TABS: 150.0000 mg | ORAL_TABLET | Freq: Once | ORAL | 0 refills | Status: AC

## 2019-10-23 MED FILL — VYNDAMAX 61 MG CAPS: 61 | 30 days supply | Qty: 30 | Fill #2

## 2019-10-23 NOTE — Progress Notes (Signed)
Subjective: CC: hospital follow up PCP: [name], DO ZYS:[id number] P Carton is a 69 y.o. female, who is brought to the office today by her husband.  She is presenting to clinic today for:  1. Hospital follow up Patient here status post amputation of left toes.  She had recent bypass graft done in the left lower extremity as well.  She has had follow-up with her vascular surgeon and podiatrist.  She has had good checkups with both.  She continues to take medications as prescribed.  She denies any excessive bleeding even on the Eliquis.  She does note that she is continuing to take Septra as prescribed by her specialist.  She has a couple of days of this left but has noticed some irritation in the mouth and some vaginal irritation as well.  She thinks she has a yeast infection.  She also notes postnasal drip.  She is not currently on any antihistamines because she was worried to start anything given the multiple medication she is on.  Denies any fevers, chills, cough.    ROS: Per HPI  Allergies  Allergen Reactions  . Amlodipine Swelling    Ankle swelling  . Atorvastatin Other (See Comments)    Myalgias   . Crestor [Rosuvastatin Calcium] Other (See Comments)    Stiffness and back pain  . Ezetimibe-Simvastatin Other (See Comments)    Leg cramps  . Statins Other (See Comments)    Myalgias  . Celebrex [Celecoxib] Rash  . Levemir [Insulin Detemir] Rash   Past Medical History:  Diagnosis Date  . Atrial fibrillation ([place])    a. maintaining sinus s/p DCCV, on Eliquis  . CAD (coronary artery disease)    a. Nonobstructive by cath 2006 - 25% LAD, 25-30% after 1st diag, distal 25% PDA, minor irreg LCx. b. Normal nuc 2011.  . CKD (chronic kidney disease), stage III   . Cystocele   . Difficult intubation 09-07-2012   big neck trouble with intubation parotid surgery"takes little med to sedate"  . Esophageal stricture   . GERD (gastroesophageal reflux disease)   . Hiatal hernia      a. 07/2013: HH with small stricture holding up barium tablet (concides with patient's sx of food sticking).  . History of kidney stones   . Hyperlipidemia   . Hypertension   . IBS (irritable bowel syndrome)   . Memory difficulty 11/26/2015  . Morbid obesity ([place])    a. hx of gastric bypass (sleeve gastrectomy 2015)  . Osteoporosis   . 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  Marland Kitchen Pericardial effusion    a. Mod by echo 2012 at time of PNA. b. Echo 07/2012: small pericardial effusion vs fat.  . Pressure ulcer 04/16/2019   right foot  . S/P TAVR (transcatheter aortic valve replacement)   . Severe aortic stenosis   . Sleep apnea     "have mask; don't use it" (02/14/2018)  . TIA (transient ischemic attack) 10/2014; 06/2016   "some memory issues since; daughter says my speech is sometimes different" (02/14/2018)  . Type II diabetes mellitus ([place])     Current Outpatient Medications:  .  ACCU-CHEK AVIVA PLUS test strip, USE TO CHECK BLOOD SUGAR UP TO TWICE DAILY OR AS INSTRUCTED, Disp: 100 each, Rfl: 2 .  Alirocumab (PRALUENT) 75 MG/ML SOAJ, Inject 75 mg into the skin every 14 (fourteen) days., Disp: 2 pen, Rfl: 6 .  apixaban (ELIQUIS) 5 MG TABS tablet, Take 1 tablet (  5 mg total) by mouth 2 (two) times daily., Disp: 180 tablet, Rfl: 1 .  B-D INS SYR ULTRAFINE 1CC/31G 31G X 5/16" 1 ML MISC, USE TO INJECT INSULIN TWICE A DAY AS INSTRUCTED (Patient taking differently: 1 Stick by Other route 3 (three) times daily before meals. ), Disp: 100 each, Rfl: 2 .  Calcium Carbonate-Vitamin D (CALCIUM 600+D) 600-400 MG-UNIT tablet, Take 1 tablet by mouth daily., Disp: , Rfl:  .  carvedilol (COREG) 25 MG tablet, Take 25 mg by mouth 2 (two) times daily with a meal., Disp: , Rfl:  .  Cholecalciferol (VITAMIN D3) 50 MCG (2000 UT) TABS, Take 2,000 Units by mouth daily., Disp: , Rfl:  .  fenofibrate (TRICOR) 145 MG tablet, Take 1 tablet (145 mg total) by mouth daily., Disp: 30 tablet, Rfl: 5 .   hydrALAZINE (APRESOLINE) 10 MG tablet, Take 10 mg by mouth 3 (three) times daily., Disp: , Rfl:  .  icosapent Ethyl (VASCEPA) 1 g capsule, Take 2 capsules (2 g total) by mouth 2 (two) times daily., Disp: 120 capsule, Rfl: 3 .  insulin NPH Human (NOVOLIN N) 100 UNIT/ML injection, Inject into the skin. 50 am 50 lunch 25 bedtime sliding scale, Disp: , Rfl:  .  Insulin Pen Needle 32G X 4 MM MISC, Use to inject insulin with insulin pen, Disp: 100 each, Rfl: 4 .  LIVALO 4 MG TABS, TAKE 1 TABLET BY MOUTH EVERY DAY, Disp: 90 tablet, Rfl: 3 .  meclizine (ANTIVERT) 12.5 MG tablet, Take 1 tablet (12.5 mg total) by mouth 3 (three) times daily as needed for dizziness., Disp: 30 tablet, Rfl: 5 .  Multiple Vitamins-Minerals (MULTIVITAMIN GUMMIES ADULT PO), Take 2 tablets by mouth daily., Disp: , Rfl:  .  nutrition supplement, JUVEN, (JUVEN) PACK, Take 1 packet by mouth 2 (two) times daily between meals., Disp: 60 packet, Rfl: 0 .  ondansetron (ZOFRAN ODT) 4 MG disintegrating tablet, Take 1 tablet (4 mg total) by mouth every 8 (eight) hours as needed for nausea., Disp: 10 tablet, Rfl: 0 .  Prenatal Vit-Fe Fumarate-FA (PRENATAL MULTIVITAMIN) TABS tablet, Take 1 tablet by mouth 3 (three) times a week., Disp: , Rfl:  .  Propylene Glycol (SYSTANE COMPLETE) 0.6 % SOLN, Apply 1 drop to eye daily as needed (dry eyes). , Disp: , Rfl:  .  Tafamidis (VYNDAMAX) 61 MG CAPS, Take 61 mg by mouth daily., Disp: 30 capsule, Rfl: 11 .  torsemide (DEMADEX) 20 MG tablet, Take one tablet twice a day alternating with 1 tablet daily every other day, Disp: 45 tablet, Rfl: 6 Social History   Socioeconomic History  . Marital status: Married    Spouse name: Not on file  . Number of children: 3  . Years of education: some college  . Highest education level: Some college, no degree  Occupational History  . Occupation: retired    Fish farm manager: [employer]  Tobacco Use  . Smoking status: Never Smoker  . Smokeless tobacco: Never Used    Substance and Sexual Activity  . Alcohol use: Yes    Alcohol/week: 0.0 standard drinks    Comment:  "mixed drink q few years"  . Drug use: Never  . Sexual activity: Yes    Birth control/protection: Post-menopausal  Other Topics Concern  . Not on file  Social History Narrative   Married   Patient is right handed.   Patient rarely drinks caffeine.   Lives at home with husband in two story home. Husband washes laundry in the  basement. They have 3 daughters and 7 grandchildren.    Social Determinants of Health   Financial Resource Strain:   . Difficulty of Paying Living Expenses: Not on file  Food Insecurity:   . Worried About Charity fundraiser in the Last Year: Not on file  . Ran Out of Food in the Last Year: Not on file  Transportation Needs:   . Lack of Transportation (Medical): Not on file  . Lack of Transportation (Non-Medical): Not on file  Physical Activity:   . Days of Exercise per Week: Not on file  . Minutes of Exercise per Session: Not on file  Stress:   . Feeling of Stress : Not on file  Social Connections:   . Frequency of Communication with Friends and Family: Not on file  . Frequency of Social Gatherings with Friends and Family: Not on file  . Attends Religious Services: Not on file  . Active Member of Clubs or Organizations: Not on file  . Attends Archivist Meetings: Not on file  . Marital Status: Not on file  Intimate Partner Violence:   . Fear of Current or Ex-Partner: Not on file  . Emotionally Abused: Not on file  . Physically Abused: Not on file  . Sexually Abused: Not on file   Family History  Problem Relation Age of Onset  . Heart failure Mother   . Hypertension Mother   . Skin cancer Mother   . Colitis Mother   . Diabetes Mother   . Kidney Stones Mother   . Stroke Mother   . Heart disease Father   . Colon polyps Father   . Diabetes Father   . Heart failure Father   . Heart attack Sister   . Diabetes Other   . Stroke Maternal  Grandfather   . Diabetes Maternal Grandfather   . Colon cancer Neg Hx     Objective: Office vital signs reviewed. There were no vitals taken for this visit.  Physical Examination:  General: Awake, alert, chronically ill appearing female, No acute distress HEENT: Normal; oropharynx with mild erythema and cobblestoning.  No overt white patches or ulcerations noted within the mouth Cardio: regular rate and rhythm, T4H9 heard, systolic murmur noted Pulm: clear to auscultation bilaterally, no wheezes, rhonchi or rales; normal work of breathing on room air MSK: Arrives in wheelchair.  Her forefoot is surgically absent.  She is wrapped in bandage and gauze.  No bleeding or saturation noted on the gauze Neuro: Alert and oriented  Assessment/ Plan: 69 y.o. female   1. Yeast vaginitis Diflucan sent to pharmacy.  Instructions for use discussed.  She will follow as needed - fluconazole (DIFLUCAN) 150 MG tablet; Take 1 tablet (150 mg total) by mouth once for 1 dose. May repeat in 3 days if still having symptoms  Dispense: 2 tablet; Refill: 0  2. Seasonal allergic rhinitis due to pollen Trial of Claritin.  May need to consider addition of Astelin if this does not sufficiently relieve drainage - loratadine (CLARITIN) 10 MG tablet; Take 1 tablet (10 mg total) by mouth daily.  Dispense: 30 tablet; Refill: 11  3. Hospital discharge follow-up I have reviewed the records and most recent specialist visits.  She will have labs collected with Dr. Joelyn Oms in about a week and a half.  She wished to defer her A1c and repeat BMP to that visit  4. S/P transmetatarsal amputation of foot, left (HCC) Hemostatic and without evidence of complication at this time.  No orders of the defined types were placed in this encounter.  No orders of the defined types were placed in this encounter.    [name], DO [place] [phone]

## 2019-10-23 NOTE — Patient Instructions (Addendum)
Get Dr Joelyn Oms to check your A1c please.  Claritin for allergies sent.  Take 1 tablet daily.  Diflucan for yeast sent.  May repeat dose in THREE days if still having symptoms.

## 2019-10-24 DIAGNOSIS — E1151 Type 2 diabetes mellitus with diabetic peripheral angiopathy without gangrene: Secondary | ICD-10-CM | POA: Diagnosis not present

## 2019-10-24 DIAGNOSIS — M86172 Other acute osteomyelitis, left ankle and foot: Secondary | ICD-10-CM | POA: Diagnosis not present

## 2019-10-24 DIAGNOSIS — E1169 Type 2 diabetes mellitus with other specified complication: Secondary | ICD-10-CM | POA: Diagnosis not present

## 2019-10-24 DIAGNOSIS — I130 Hypertensive heart and chronic kidney disease with heart failure and stage 1 through stage 4 chronic kidney disease, or unspecified chronic kidney disease: Secondary | ICD-10-CM | POA: Diagnosis not present

## 2019-10-24 DIAGNOSIS — I70209 Unspecified atherosclerosis of native arteries of extremities, unspecified extremity: Secondary | ICD-10-CM | POA: Diagnosis not present

## 2019-10-24 DIAGNOSIS — Z4781 Encounter for orthopedic aftercare following surgical amputation: Secondary | ICD-10-CM | POA: Diagnosis not present

## 2019-10-29 ENCOUNTER — Ambulatory Visit: Payer: Medicare Other | Admitting: Family Medicine

## 2019-11-07 DIAGNOSIS — E1122 Type 2 diabetes mellitus with diabetic chronic kidney disease: Secondary | ICD-10-CM | POA: Diagnosis not present

## 2019-11-07 DIAGNOSIS — I480 Paroxysmal atrial fibrillation: Secondary | ICD-10-CM | POA: Diagnosis not present

## 2019-11-07 DIAGNOSIS — N184 Chronic kidney disease, stage 4 (severe): Secondary | ICD-10-CM | POA: Diagnosis not present

## 2019-11-07 DIAGNOSIS — I129 Hypertensive chronic kidney disease with stage 1 through stage 4 chronic kidney disease, or unspecified chronic kidney disease: Secondary | ICD-10-CM | POA: Diagnosis not present

## 2019-11-07 DIAGNOSIS — N390 Urinary tract infection, site not specified: Secondary | ICD-10-CM | POA: Diagnosis not present

## 2019-11-08 ENCOUNTER — Telehealth (HOSPITAL_COMMUNITY): Payer: Self-pay | Admitting: *Deleted

## 2019-11-08 NOTE — Telephone Encounter (Signed)
No worry about the combination.

## 2019-11-08 NOTE — Telephone Encounter (Signed)
Pt left VM stating her 2nd dose of the covid vaccine is due around the same time she takes praluent. Patient wants to know if its ok to take these two injections close together or should she hold off on taking praluent.  Routed to [place] for advice

## 2019-11-08 NOTE — Telephone Encounter (Signed)
Pt aware.

## 2019-11-14 DIAGNOSIS — Z4781 Encounter for orthopedic aftercare following surgical amputation: Secondary | ICD-10-CM | POA: Diagnosis not present

## 2019-11-14 DIAGNOSIS — M86172 Other acute osteomyelitis, left ankle and foot: Secondary | ICD-10-CM | POA: Diagnosis not present

## 2019-11-14 DIAGNOSIS — I70209 Unspecified atherosclerosis of native arteries of extremities, unspecified extremity: Secondary | ICD-10-CM | POA: Diagnosis not present

## 2019-11-14 DIAGNOSIS — I130 Hypertensive heart and chronic kidney disease with heart failure and stage 1 through stage 4 chronic kidney disease, or unspecified chronic kidney disease: Secondary | ICD-10-CM | POA: Diagnosis not present

## 2019-11-14 DIAGNOSIS — E1151 Type 2 diabetes mellitus with diabetic peripheral angiopathy without gangrene: Secondary | ICD-10-CM | POA: Diagnosis not present

## 2019-11-14 DIAGNOSIS — E1169 Type 2 diabetes mellitus with other specified complication: Secondary | ICD-10-CM | POA: Diagnosis not present

## 2019-11-16 ENCOUNTER — Other Ambulatory Visit: Payer: Self-pay | Admitting: Cardiology

## 2019-11-16 DIAGNOSIS — Z23 Encounter for immunization: Secondary | ICD-10-CM | POA: Diagnosis not present

## 2019-11-18 ENCOUNTER — Encounter (HOSPITAL_COMMUNITY): Payer: Self-pay | Admitting: Cardiology

## 2019-11-18 ENCOUNTER — Other Ambulatory Visit: Payer: Self-pay

## 2019-11-18 ENCOUNTER — Ambulatory Visit (HOSPITAL_COMMUNITY)
Admission: RE | Admit: 2019-11-18 | Discharge: 2019-11-18 | Disposition: A | Discharge status: Home / Self Care | Payer: Medicare Other | Source: Ambulatory Visit | Attending: Cardiology | Admitting: Cardiology

## 2019-11-18 VITALS — BP 124/54 | HR 71 | Wt 202.2 lb

## 2019-11-18 DIAGNOSIS — E854 Organ-limited amyloidosis: Secondary | ICD-10-CM | POA: Diagnosis not present

## 2019-11-18 DIAGNOSIS — Z794 Long term (current) use of insulin: Secondary | ICD-10-CM | POA: Insufficient documentation

## 2019-11-18 DIAGNOSIS — Z952 Presence of prosthetic heart valve: Secondary | ICD-10-CM

## 2019-11-18 DIAGNOSIS — N183 Chronic kidney disease, stage 3 unspecified: Secondary | ICD-10-CM | POA: Insufficient documentation

## 2019-11-18 DIAGNOSIS — E113293 Type 2 diabetes mellitus with mild nonproliferative diabetic retinopathy without macular edema, bilateral: Secondary | ICD-10-CM | POA: Diagnosis not present

## 2019-11-18 DIAGNOSIS — I2510 Atherosclerotic heart disease of native coronary artery without angina pectoris: Secondary | ICD-10-CM | POA: Diagnosis not present

## 2019-11-18 DIAGNOSIS — I5032 Chronic diastolic (congestive) heart failure: Secondary | ICD-10-CM

## 2019-11-18 DIAGNOSIS — Z79899 Other long term (current) drug therapy: Secondary | ICD-10-CM | POA: Diagnosis not present

## 2019-11-18 DIAGNOSIS — Z888 Allergy status to other drugs, medicaments and biological substances status: Secondary | ICD-10-CM | POA: Diagnosis not present

## 2019-11-18 DIAGNOSIS — E1169 Type 2 diabetes mellitus with other specified complication: Secondary | ICD-10-CM | POA: Diagnosis not present

## 2019-11-18 DIAGNOSIS — I5033 Acute on chronic diastolic (congestive) heart failure: Secondary | ICD-10-CM | POA: Diagnosis not present

## 2019-11-18 DIAGNOSIS — E785 Hyperlipidemia, unspecified: Secondary | ICD-10-CM | POA: Diagnosis not present

## 2019-11-18 DIAGNOSIS — G4733 Obstructive sleep apnea (adult) (pediatric): Secondary | ICD-10-CM | POA: Diagnosis not present

## 2019-11-18 DIAGNOSIS — Z886 Allergy status to analgesic agent status: Secondary | ICD-10-CM | POA: Diagnosis not present

## 2019-11-18 DIAGNOSIS — I480 Paroxysmal atrial fibrillation: Secondary | ICD-10-CM

## 2019-11-18 DIAGNOSIS — I130 Hypertensive heart and chronic kidney disease with heart failure and stage 1 through stage 4 chronic kidney disease, or unspecified chronic kidney disease: Secondary | ICD-10-CM | POA: Diagnosis not present

## 2019-11-18 DIAGNOSIS — G4730 Sleep apnea, unspecified: Secondary | ICD-10-CM | POA: Diagnosis not present

## 2019-11-18 DIAGNOSIS — K219 Gastro-esophageal reflux disease without esophagitis: Secondary | ICD-10-CM | POA: Diagnosis not present

## 2019-11-18 DIAGNOSIS — E1151 Type 2 diabetes mellitus with diabetic peripheral angiopathy without gangrene: Secondary | ICD-10-CM | POA: Diagnosis not present

## 2019-11-18 DIAGNOSIS — Z6831 Body mass index (BMI) 31.0-31.9, adult: Secondary | ICD-10-CM | POA: Diagnosis not present

## 2019-11-18 DIAGNOSIS — E1140 Type 2 diabetes mellitus with diabetic neuropathy, unspecified: Secondary | ICD-10-CM | POA: Diagnosis not present

## 2019-11-18 DIAGNOSIS — E1122 Type 2 diabetes mellitus with diabetic chronic kidney disease: Secondary | ICD-10-CM | POA: Insufficient documentation

## 2019-11-18 DIAGNOSIS — M1990 Unspecified osteoarthritis, unspecified site: Secondary | ICD-10-CM | POA: Diagnosis not present

## 2019-11-18 DIAGNOSIS — Z833 Family history of diabetes mellitus: Secondary | ICD-10-CM | POA: Diagnosis not present

## 2019-11-18 DIAGNOSIS — H903 Sensorineural hearing loss, bilateral: Secondary | ICD-10-CM | POA: Diagnosis not present

## 2019-11-18 DIAGNOSIS — M86172 Other acute osteomyelitis, left ankle and foot: Secondary | ICD-10-CM | POA: Diagnosis not present

## 2019-11-18 DIAGNOSIS — Z7901 Long term (current) use of anticoagulants: Secondary | ICD-10-CM | POA: Insufficient documentation

## 2019-11-18 DIAGNOSIS — Z9181 History of falling: Secondary | ICD-10-CM | POA: Diagnosis not present

## 2019-11-18 DIAGNOSIS — I70209 Unspecified atherosclerosis of native arteries of extremities, unspecified extremity: Secondary | ICD-10-CM | POA: Diagnosis not present

## 2019-11-18 DIAGNOSIS — E669 Obesity, unspecified: Secondary | ICD-10-CM | POA: Diagnosis not present

## 2019-11-18 DIAGNOSIS — Z9884 Bariatric surgery status: Secondary | ICD-10-CM | POA: Diagnosis not present

## 2019-11-18 DIAGNOSIS — H9110 Presbycusis, unspecified ear: Secondary | ICD-10-CM | POA: Diagnosis not present

## 2019-11-18 DIAGNOSIS — Z8249 Family history of ischemic heart disease and other diseases of the circulatory system: Secondary | ICD-10-CM | POA: Diagnosis not present

## 2019-11-18 DIAGNOSIS — K222 Esophageal obstruction: Secondary | ICD-10-CM | POA: Diagnosis not present

## 2019-11-18 DIAGNOSIS — Z4781 Encounter for orthopedic aftercare following surgical amputation: Secondary | ICD-10-CM | POA: Diagnosis not present

## 2019-11-18 DIAGNOSIS — N184 Chronic kidney disease, stage 4 (severe): Secondary | ICD-10-CM | POA: Diagnosis not present

## 2019-11-18 LAB — BASIC METABOLIC PANEL
Anion gap: 14 (ref 5–15)
BUN: 58 mg/dL — ABNORMAL HIGH (ref 8–23)
CO2: 28 mmol/L (ref 22–32)
Calcium: 9.1 mg/dL (ref 8.9–10.3)
Chloride: 100 mmol/L (ref 98–111)
Creatinine, Ser: 2.7 mg/dL — ABNORMAL HIGH (ref 0.44–1.00)
GFR calc Af Amer: 20 mL/min — ABNORMAL LOW (ref >60)
GFR calc non Af Amer: 17 mL/min — ABNORMAL LOW (ref >60)
Glucose, Bld: 284 mg/dL — ABNORMAL HIGH (ref 70–99)
Potassium: 3.9 mmol/L (ref 3.5–5.1)
Sodium: 142 mmol/L (ref 135–145)

## 2019-11-18 LAB — CBC
HCT: 38.3 % (ref 36.0–46.0)
Hemoglobin: 11.8 g/dL — ABNORMAL LOW (ref 12.0–15.0)
MCH: 25.8 pg — ABNORMAL LOW (ref 26.0–34.0)
MCHC: 30.8 g/dL (ref 30.0–36.0)
MCV: 83.6 fL (ref 80.0–100.0)
Platelets: 199 10*3/uL (ref 150–400)
RBC: 4.58 MIL/uL (ref 3.87–5.11)
RDW: 16.1 % — ABNORMAL HIGH (ref 11.5–15.5)
WBC: 7.4 10*3/uL (ref 4.0–10.5)
nRBC: 0 % (ref 0.0–0.2)

## 2019-11-18 NOTE — Patient Instructions (Signed)
No medication changes today!  Labs today We will only contact you if something comes back abnormal or we need to make some changes. Otherwise no news is good news!   Your physician recommends that you schedule a follow-up appointment in: 4 months with Dr Aundra Dubin.    Please call office at [phone] option 2 if you have any questions or concerns.    At the [facility], you and your health needs are our priority. As part of our continuing mission to provide you with exceptional heart care, we have created designated Provider Care Teams. These Care Teams include your primary Cardiologist (physician) and Advanced Practice Providers (APPs- Physician Assistants and Nurse Practitioners) who all work together to provide you with the care you need, when you need it.   You may see any of the following providers on your designated Care Team at your next follow up: Marland Kitchen Dr Glori Bickers . Dr [name] . Darrick Grinder, NP . Lyda Jester, PA . Audry Riles, PharmD   Please be sure to bring in all your medications bottles to every appointment.

## 2019-11-19 ENCOUNTER — Telehealth (HOSPITAL_COMMUNITY): Payer: Self-pay

## 2019-11-19 MED ORDER — TORSEMIDE 20 MG PO TABS: 20.0000 mg | ORAL_TABLET | Freq: Every day | ORAL | 5 refills | Status: DC

## 2019-11-19 NOTE — Telephone Encounter (Signed)
-----   Message from Larey Dresser, MD sent at 11/18/2019 10:13 PM EDT ----- Decrease torsemide to 20 mg daily, stop the every other day pm dose.  BMET 1 week.

## 2019-11-19 NOTE — Progress Notes (Signed)
Date:  11/19/2019   ID:  BEXLEY LAUBACH, DOB Jul 12, 1951, MRN [id number]    Provider location: [facility] Type of Visit: Established patient   PCP:  [name], DO  Cardiologist:  Minus Breeding, MD Primary HF: Dr. Aundra Dubin  Chief Complaint: Shortness of breath   History of Present Illness: [name] is a 69 y.o. female who has a history of AS s/p TAVR, chronic diastolic CHF, PAF, CKD III, HLD, HTN, OSA, and morbid obesity.   She was admitted from [facility] office 09/19/18 with marked volume overload. She diuresed 36 lbs with lasix drip and was transitioned to torsemide 40 mg daily. Course complicated by AKI. RHC completed and showed elevated filling pressures and preserved cardiac output.  PYP scan suggestive of TTR amyloid. Discharge weight was 208 pounds.   She developed a non-healing right heal ulcer followed by a podiatrist in [place].  She ended up having a peripheral angiogram in [place] with PTCA to the right popliteal and right AT.    In 9/20, she was admitted with fever and found to have Enterococcal bacteremia.  TEE was concerning for possible MV vegetation, bioprosthetic aortic valve looked ok.  She was treated with IV antibiotics, and repeat TEE in 10/20 did not show residual active endocarditis.    She is now s/p left distal SFA-tibioperoneal trunk bypass with left TMA done at [facility] in 2/21.   She returns today for followup of CHF, PAF, aortic valve disease.  She is not walking much s/p TMA, mainly in wheelchair but using walker with PT and using walker to get in bathroom.  Rare atypical chest pain.  She had one episode when her HR increased to 130s range, possible brief afib.  Breathing is ok, not short of breath at rest or with current exertion level. No lightheadedness.   Weight stable.   Labs (11/19): LDL 115 Labs (2/20): K 4.4, creatinine 2.39, myeloma panel negative Labs (7/20): LDL 150, triglycerides 422, K 4.1,  creatinine 2.97 => 2.06 Labs (11/20): K 3.8, creatinine 2.48 Labs (12/20): K 4.4, creatinine 2.75, LDL 33, TGs 457 Labs (2/21): LDL 35, TGs 229, HDL 36  PMH: 1. Chronic diastolic CHF: Echo (8/18) with EF 60-65%, moderate LVH, mildly dilated RV with mildly decreased systolic function, bioprosthetic aortic valve s/p TAVR appeared to function normally.  - RHC (2/20): mean RA 13, PA 50/21 mean 33, PCWP mean 25, CI 2.92, PVR 1.3 WU  - TEE (10/20): EF 60-65%, mild LVH, mild MR, s/p TAVR with no stenosis and mild peri-valvular leakage, no active endocarditis.  2. Cardiac amyloidosis: TTR amyloidosis.   - PYP scan (2/20): grade 2, H/CL ratio 1.8 => suggestive of TTR amyloidosis.  - myeloma panel negative.  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  3. Bioprosthetic aortic valve s/p TAVR.  4. CKD: Stage 3.  5. Atrial fibrillation: Paroxysmal.  6. Hyperlipidemia: unable to tolerate Crestor or Vascepa.  7. GERD with hiatal hernia 8. HTN 9. Obesity s/p gastric bypass in 2015.  10. Type 2 diabetes 11. OSA: Unable to tolerate CPAP.  12. CAD: LHC (8/19) with 50% proximal RCA, 50% proximal LCx, 65% mid LCx, 99% distal LAD (medical management).  13. Carotid dopplers (8/19) with mild disease.  14. PAD: Peripheral arterial angiography in 7/20 with PTCA right popliteal artery and right AT.  - S/p left distal SFA-TP trunk bypass and left TMA in 2/21.  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  Current Outpatient Medications  Medication Sig Dispense Refill  . ACCU-CHEK AVIVA PLUS test strip USE TO CHECK BLOOD SUGAR UP TO TWICE DAILY OR AS INSTRUCTED 100 each 2  . apixaban (ELIQUIS) 5 MG TABS tablet Take 1 tablet (5 mg total) by mouth 2 (two) times daily. 180 tablet 1  . B-D INS SYR ULTRAFINE 1CC/31G 31G X 5/16" 1 ML MISC USE TO INJECT INSULIN TWICE A DAY AS INSTRUCTED (Patient taking differently: 1 Stick by Other route 3 (three) times daily before meals. ) 100 each 2  .  Calcium Carbonate-Vitamin D (CALCIUM 600+D) 600-400 MG-UNIT tablet Take 1 tablet by mouth daily.    . carvedilol (COREG) 25 MG tablet Take 25 mg by mouth 2 (two) times daily with a meal.    . Cholecalciferol (VITAMIN D3) 50 MCG (2000 UT) TABS Take 2,000 Units by mouth daily.    . fenofibrate (TRICOR) 145 MG tablet Take 1 tablet (145 mg total) by mouth daily. 30 tablet 5  . hydrALAZINE (APRESOLINE) 10 MG tablet Take 10 mg by mouth 2 (two) times daily.     Marland Kitchen icosapent Ethyl (VASCEPA) 1 g capsule Take 2 g by mouth 2 (two) times daily.     . insulin NPH Human (NOVOLIN N) 100 UNIT/ML injection Inject into the skin. 50 am 50 lunch 25 bedtime sliding scale    . Insulin Pen Needle 32G X 4 MM MISC Use to inject insulin with insulin pen 100 each 4  . LIVALO 4 MG TABS Take 4 mg by mouth every evening.    Marland Kitchen MAGNESIUM PO Take by mouth daily as needed.    . meclizine (ANTIVERT) 12.5 MG tablet Take 1 tablet (12.5 mg total) by mouth 3 (three) times daily as needed for dizziness. 30 tablet 5  . Multiple Vitamins-Minerals (MULTIVITAMIN GUMMIES ADULT PO) Take 2 tablets by mouth daily.    . Multiple Vitamins-Minerals (ZINC PO) Take by mouth daily as needed.    . nutrition supplement, JUVEN, (JUVEN) PACK Take 1 packet by mouth 2 (two) times daily between meals. 60 packet 0  . ondansetron (ZOFRAN ODT) 4 MG disintegrating tablet Take 1 tablet (4 mg total) by mouth every 8 (eight) hours as needed for nausea. 10 tablet 0  . PRALUENT 75 MG/ML SOAJ INJECT 75 MG INTO THE SKIN EVERY 14 (FOURTEEN) DAYS. 2 pen 6  . Tafamidis (VYNDAMAX) 61 MG CAPS Take 61 mg by mouth daily. 30 capsule 11  . torsemide (DEMADEX) 20 MG tablet Take 1 tablet by mouth daily. Take 1 additional tablet by mouth every other evening.     No current facility-administered medications for this encounter.    Allergies:   Amlodipine, Atorvastatin, Crestor [rosuvastatin calcium], Ezetimibe-simvastatin, Sitagliptin, Statins, Celebrex [celecoxib], and Levemir  [insulin detemir]   Social History:  The patient  reports that she has never smoked. She has never used smokeless tobacco. She reports current alcohol use. She reports that she does not use drugs.   Family History:  The patient's family history includes Colitis in her mother; Colon polyps in her father; Diabetes in her father, maternal grandfather, mother, and another family member; Heart attack in her sister; Heart disease in her father; Heart failure in her father and mother; Hypertension in her mother; Kidney Stones in her mother; Skin cancer in her mother; Stroke in her maternal grandfather and mother.   ROS:  Please see the history of present illness.   All other systems are personally reviewed and negative.   Exam:  BP (!) 124/54   Pulse 71   Wt 91.7 kg (202 lb 3.2 oz)   SpO2 97%   BMI 31.67 kg/m  General: NAD Neck: Thick neck, no JVD, no thyromegaly or thyroid nodule.  Lungs: Clear to auscultation bilaterally with normal respiratory effort. CV: Nondisplaced PMI.  Heart regular S1/S2, no S3/S4, no murmur.  No peripheral edema.  No carotid bruit.  Difficult to palpate pedal pulses.  Abdomen: Soft, nontender, no hepatosplenomegaly, no distention.  Skin: Intact without lesions or rashes.  Neurologic: Alert and oriented x 3.  Psych: Normal affect. Extremities: No clubbing or cyanosis. S/p left TMA.  HEENT: Normal.   Recent Labs: 04/23/2019: Magnesium 2.0 07/22/2019: ALT 13 11/18/2019: BUN 58; Creatinine, Ser 2.70; Hemoglobin 11.8; Platelets 199; Potassium 3.9; Sodium 142  Personally reviewed   Wt Readings from Last 3 Encounters:  11/18/19 91.7 kg (202 lb 3.2 oz)  08/14/19 91.8 kg (202 lb 6.4 oz)  07/01/19 91.6 kg (202 lb)    ASSESSMENT AND PLAN:  1. Chronic diastolic CHF: TEE in 35/59 showed EF 60-65%, mild LVH, stable bioprosthetic aortic valve s/p TAVR.PYP scan in 2/20 suggestive of transthyretin amyloidosis, genetic testing suggestive of wild type.  NYHA class II-III  symptoms, she is at baseline. She does not look volume overloaded on exam.  - Continue torsemide 20 mg bid alternating with 20 mg daily. BMET today.    2. CKD stage 3: Creatinine has been higher recently.  BMET today.   3. CAD: Cath in 8/19 with moderate RCA and LCx disease, severe distal LAD disease, medical management.  No chest pain. - No ASA given apixaban use.  - Continue pitivastatin and Praluent.    - Continue fenofibrate and vascepa.  4. Atrial fibrillation: Paroxysmal. She is in NSR today.  - Continue Eliquis. CBC today.  5. HTN: BP fluctuates significantly. She has frequent orthostatic symptoms.   BP stable today.  - Continue Coreg 25 mg BID. - She cannot take amlodipine due to peripheral edema formation.  - Hold off on ACEI/ARB with CKD.  - Continue hydralazine, I will not increase given lightheadedness.    6. PAD: S/p PTCA to right popliteal and right AT in 7/20.  Now with left distal SFA-TP trunk bypass and left TMA in 2/21.   - She has followup with vascular surgery at [facility].  7. 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  - She is on tafamidis, continue   8. 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  9. 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   10. Hyperlipidemia: Lipids improved on 2/21 panel, TGs still high but better.  Continue current regimen.   Followup in 4 months.   Signed, [name], MD  11/19/2019   [place] [address] [facility] [place] [address] [phone] (office) [phone] (fax)

## 2019-11-19 NOTE — Telephone Encounter (Signed)
Pt aware of results and instructions to discontinue evening dose of torsemide. PT will repeat labs in 1 week at local [place]. Rx for blood work mailed.  Advised to call us if she does not receive prescription. Verbalized understanding.

## 2019-11-20 DIAGNOSIS — E1151 Type 2 diabetes mellitus with diabetic peripheral angiopathy without gangrene: Secondary | ICD-10-CM | POA: Diagnosis not present

## 2019-11-20 DIAGNOSIS — E1169 Type 2 diabetes mellitus with other specified complication: Secondary | ICD-10-CM | POA: Diagnosis not present

## 2019-11-20 DIAGNOSIS — Z4781 Encounter for orthopedic aftercare following surgical amputation: Secondary | ICD-10-CM | POA: Diagnosis not present

## 2019-11-20 DIAGNOSIS — M86172 Other acute osteomyelitis, left ankle and foot: Secondary | ICD-10-CM | POA: Diagnosis not present

## 2019-11-20 DIAGNOSIS — I130 Hypertensive heart and chronic kidney disease with heart failure and stage 1 through stage 4 chronic kidney disease, or unspecified chronic kidney disease: Secondary | ICD-10-CM | POA: Diagnosis not present

## 2019-11-20 DIAGNOSIS — I70209 Unspecified atherosclerosis of native arteries of extremities, unspecified extremity: Secondary | ICD-10-CM | POA: Diagnosis not present

## 2019-11-21 ENCOUNTER — Other Ambulatory Visit: Payer: Medicare Other

## 2019-11-21 ENCOUNTER — Other Ambulatory Visit: Payer: Self-pay

## 2019-11-21 ENCOUNTER — Other Ambulatory Visit: Payer: Self-pay | Admitting: Cardiology

## 2019-11-21 DIAGNOSIS — I5020 Unspecified systolic (congestive) heart failure: Secondary | ICD-10-CM | POA: Diagnosis not present

## 2019-11-21 DIAGNOSIS — N184 Chronic kidney disease, stage 4 (severe): Secondary | ICD-10-CM | POA: Diagnosis not present

## 2019-11-21 MED FILL — VYNDAMAX 61 MG CAPS: 61 | 30 days supply | Qty: 30 | Fill #3

## 2019-11-22 DIAGNOSIS — I70209 Unspecified atherosclerosis of native arteries of extremities, unspecified extremity: Secondary | ICD-10-CM | POA: Diagnosis not present

## 2019-11-22 DIAGNOSIS — I130 Hypertensive heart and chronic kidney disease with heart failure and stage 1 through stage 4 chronic kidney disease, or unspecified chronic kidney disease: Secondary | ICD-10-CM | POA: Diagnosis not present

## 2019-11-22 DIAGNOSIS — M86172 Other acute osteomyelitis, left ankle and foot: Secondary | ICD-10-CM | POA: Diagnosis not present

## 2019-11-22 DIAGNOSIS — E1151 Type 2 diabetes mellitus with diabetic peripheral angiopathy without gangrene: Secondary | ICD-10-CM | POA: Diagnosis not present

## 2019-11-22 DIAGNOSIS — Z4781 Encounter for orthopedic aftercare following surgical amputation: Secondary | ICD-10-CM | POA: Diagnosis not present

## 2019-11-22 DIAGNOSIS — E1169 Type 2 diabetes mellitus with other specified complication: Secondary | ICD-10-CM | POA: Diagnosis not present

## 2019-11-22 LAB — BASIC METABOLIC PANEL
BUN/Creatinine Ratio: 22 (ref 12–28)
BUN: 60 mg/dL — ABNORMAL HIGH (ref 8–27)
CO2: 23 mmol/L (ref 20–29)
Calcium: 8.8 mg/dL (ref 8.7–10.3)
Chloride: 104 mmol/L (ref 96–106)
Creatinine, Ser: 2.73 mg/dL — ABNORMAL HIGH (ref 0.57–1.00)
GFR calc Af Amer: 20 mL/min/{1.73_m2} — ABNORMAL LOW (ref >59)
GFR calc non Af Amer: 17 mL/min/{1.73_m2} — ABNORMAL LOW (ref >59)
Glucose: 283 mg/dL — ABNORMAL HIGH (ref 65–99)
Potassium: 4 mmol/L (ref 3.5–5.2)
Sodium: 144 mmol/L (ref 134–144)

## 2019-11-22 LAB — SPECIMEN STATUS REPORT

## 2019-11-27 DIAGNOSIS — Z4781 Encounter for orthopedic aftercare following surgical amputation: Secondary | ICD-10-CM | POA: Diagnosis not present

## 2019-11-27 DIAGNOSIS — I70209 Unspecified atherosclerosis of native arteries of extremities, unspecified extremity: Secondary | ICD-10-CM | POA: Diagnosis not present

## 2019-11-27 DIAGNOSIS — M86172 Other acute osteomyelitis, left ankle and foot: Secondary | ICD-10-CM | POA: Diagnosis not present

## 2019-11-27 DIAGNOSIS — E1151 Type 2 diabetes mellitus with diabetic peripheral angiopathy without gangrene: Secondary | ICD-10-CM | POA: Diagnosis not present

## 2019-11-27 DIAGNOSIS — E1169 Type 2 diabetes mellitus with other specified complication: Secondary | ICD-10-CM | POA: Diagnosis not present

## 2019-11-27 DIAGNOSIS — I130 Hypertensive heart and chronic kidney disease with heart failure and stage 1 through stage 4 chronic kidney disease, or unspecified chronic kidney disease: Secondary | ICD-10-CM | POA: Diagnosis not present

## 2019-11-29 DIAGNOSIS — E1151 Type 2 diabetes mellitus with diabetic peripheral angiopathy without gangrene: Secondary | ICD-10-CM | POA: Diagnosis not present

## 2019-11-29 DIAGNOSIS — M86172 Other acute osteomyelitis, left ankle and foot: Secondary | ICD-10-CM | POA: Diagnosis not present

## 2019-11-29 DIAGNOSIS — I130 Hypertensive heart and chronic kidney disease with heart failure and stage 1 through stage 4 chronic kidney disease, or unspecified chronic kidney disease: Secondary | ICD-10-CM | POA: Diagnosis not present

## 2019-11-29 DIAGNOSIS — Z4781 Encounter for orthopedic aftercare following surgical amputation: Secondary | ICD-10-CM | POA: Diagnosis not present

## 2019-11-29 DIAGNOSIS — E1169 Type 2 diabetes mellitus with other specified complication: Secondary | ICD-10-CM | POA: Diagnosis not present

## 2019-11-29 DIAGNOSIS — I70209 Unspecified atherosclerosis of native arteries of extremities, unspecified extremity: Secondary | ICD-10-CM | POA: Diagnosis not present

## 2019-12-03 ENCOUNTER — Other Ambulatory Visit: Payer: Self-pay

## 2019-12-03 ENCOUNTER — Ambulatory Visit (INDEPENDENT_AMBULATORY_CARE_PROVIDER_SITE_OTHER): Payer: Medicare Other

## 2019-12-03 DIAGNOSIS — Z86718 Personal history of other venous thrombosis and embolism: Secondary | ICD-10-CM

## 2019-12-03 DIAGNOSIS — Z95820 Peripheral vascular angioplasty status with implants and grafts: Secondary | ICD-10-CM

## 2019-12-03 DIAGNOSIS — I130 Hypertensive heart and chronic kidney disease with heart failure and stage 1 through stage 4 chronic kidney disease, or unspecified chronic kidney disease: Secondary | ICD-10-CM | POA: Diagnosis not present

## 2019-12-03 DIAGNOSIS — H9110 Presbycusis, unspecified ear: Secondary | ICD-10-CM

## 2019-12-03 DIAGNOSIS — E669 Obesity, unspecified: Secondary | ICD-10-CM

## 2019-12-03 DIAGNOSIS — G4730 Sleep apnea, unspecified: Secondary | ICD-10-CM | POA: Diagnosis not present

## 2019-12-03 DIAGNOSIS — E1122 Type 2 diabetes mellitus with diabetic chronic kidney disease: Secondary | ICD-10-CM | POA: Diagnosis not present

## 2019-12-03 DIAGNOSIS — Z952 Presence of prosthetic heart valve: Secondary | ICD-10-CM

## 2019-12-03 DIAGNOSIS — Z9884 Bariatric surgery status: Secondary | ICD-10-CM

## 2019-12-03 DIAGNOSIS — Z794 Long term (current) use of insulin: Secondary | ICD-10-CM

## 2019-12-03 DIAGNOSIS — Z9181 History of falling: Secondary | ICD-10-CM

## 2019-12-03 DIAGNOSIS — M86172 Other acute osteomyelitis, left ankle and foot: Secondary | ICD-10-CM

## 2019-12-03 DIAGNOSIS — E854 Organ-limited amyloidosis: Secondary | ICD-10-CM

## 2019-12-03 DIAGNOSIS — E1151 Type 2 diabetes mellitus with diabetic peripheral angiopathy without gangrene: Secondary | ICD-10-CM | POA: Diagnosis not present

## 2019-12-03 DIAGNOSIS — Z7901 Long term (current) use of anticoagulants: Secondary | ICD-10-CM

## 2019-12-03 DIAGNOSIS — I480 Paroxysmal atrial fibrillation: Secondary | ICD-10-CM

## 2019-12-03 DIAGNOSIS — I5032 Chronic diastolic (congestive) heart failure: Secondary | ICD-10-CM | POA: Diagnosis not present

## 2019-12-03 DIAGNOSIS — N184 Chronic kidney disease, stage 4 (severe): Secondary | ICD-10-CM | POA: Diagnosis not present

## 2019-12-03 DIAGNOSIS — E113293 Type 2 diabetes mellitus with mild nonproliferative diabetic retinopathy without macular edema, bilateral: Secondary | ICD-10-CM

## 2019-12-03 DIAGNOSIS — I70209 Unspecified atherosclerosis of native arteries of extremities, unspecified extremity: Secondary | ICD-10-CM | POA: Diagnosis not present

## 2019-12-03 DIAGNOSIS — Z4781 Encounter for orthopedic aftercare following surgical amputation: Secondary | ICD-10-CM

## 2019-12-03 DIAGNOSIS — E1169 Type 2 diabetes mellitus with other specified complication: Secondary | ICD-10-CM | POA: Diagnosis not present

## 2019-12-03 DIAGNOSIS — I2510 Atherosclerotic heart disease of native coronary artery without angina pectoris: Secondary | ICD-10-CM

## 2019-12-03 DIAGNOSIS — K222 Esophageal obstruction: Secondary | ICD-10-CM

## 2019-12-03 DIAGNOSIS — H903 Sensorineural hearing loss, bilateral: Secondary | ICD-10-CM

## 2019-12-03 DIAGNOSIS — Z8673 Personal history of transient ischemic attack (TIA), and cerebral infarction without residual deficits: Secondary | ICD-10-CM

## 2019-12-03 DIAGNOSIS — E785 Hyperlipidemia, unspecified: Secondary | ICD-10-CM

## 2019-12-03 DIAGNOSIS — E1140 Type 2 diabetes mellitus with diabetic neuropathy, unspecified: Secondary | ICD-10-CM

## 2019-12-03 DIAGNOSIS — M1990 Unspecified osteoarthritis, unspecified site: Secondary | ICD-10-CM

## 2019-12-05 ENCOUNTER — Other Ambulatory Visit (HOSPITAL_COMMUNITY): Payer: Self-pay | Admitting: Cardiology

## 2019-12-05 DIAGNOSIS — Z4781 Encounter for orthopedic aftercare following surgical amputation: Secondary | ICD-10-CM | POA: Diagnosis not present

## 2019-12-05 DIAGNOSIS — E1169 Type 2 diabetes mellitus with other specified complication: Secondary | ICD-10-CM | POA: Diagnosis not present

## 2019-12-05 DIAGNOSIS — M86172 Other acute osteomyelitis, left ankle and foot: Secondary | ICD-10-CM | POA: Diagnosis not present

## 2019-12-05 DIAGNOSIS — I70209 Unspecified atherosclerosis of native arteries of extremities, unspecified extremity: Secondary | ICD-10-CM | POA: Diagnosis not present

## 2019-12-05 DIAGNOSIS — I130 Hypertensive heart and chronic kidney disease with heart failure and stage 1 through stage 4 chronic kidney disease, or unspecified chronic kidney disease: Secondary | ICD-10-CM | POA: Diagnosis not present

## 2019-12-05 DIAGNOSIS — E1151 Type 2 diabetes mellitus with diabetic peripheral angiopathy without gangrene: Secondary | ICD-10-CM | POA: Diagnosis not present

## 2019-12-09 ENCOUNTER — Other Ambulatory Visit: Payer: Self-pay

## 2019-12-09 ENCOUNTER — Observation Stay (HOSPITAL_COMMUNITY)
Admission: EM | Admit: 2019-12-09 | Discharge: 2019-12-10 | Disposition: A | Discharge status: Home Health | Payer: Medicare Other | Attending: Cardiology | Admitting: Cardiology

## 2019-12-09 ENCOUNTER — Emergency Department (HOSPITAL_COMMUNITY): Payer: Medicare Other

## 2019-12-09 ENCOUNTER — Telehealth (HOSPITAL_COMMUNITY): Payer: Self-pay | Admitting: *Deleted

## 2019-12-09 DIAGNOSIS — E1151 Type 2 diabetes mellitus with diabetic peripheral angiopathy without gangrene: Secondary | ICD-10-CM | POA: Insufficient documentation

## 2019-12-09 DIAGNOSIS — R42 Dizziness and giddiness: Secondary | ICD-10-CM | POA: Diagnosis not present

## 2019-12-09 DIAGNOSIS — Z953 Presence of xenogenic heart valve: Secondary | ICD-10-CM | POA: Insufficient documentation

## 2019-12-09 DIAGNOSIS — Z7901 Long term (current) use of anticoagulants: Secondary | ICD-10-CM | POA: Diagnosis not present

## 2019-12-09 DIAGNOSIS — R079 Chest pain, unspecified: Secondary | ICD-10-CM | POA: Diagnosis not present

## 2019-12-09 DIAGNOSIS — Z20822 Contact with and (suspected) exposure to covid-19: Secondary | ICD-10-CM | POA: Diagnosis not present

## 2019-12-09 DIAGNOSIS — Z049 Encounter for examination and observation for unspecified reason: Secondary | ICD-10-CM | POA: Diagnosis not present

## 2019-12-09 DIAGNOSIS — E785 Hyperlipidemia, unspecified: Secondary | ICD-10-CM | POA: Diagnosis not present

## 2019-12-09 DIAGNOSIS — I5033 Acute on chronic diastolic (congestive) heart failure: Secondary | ICD-10-CM | POA: Diagnosis not present

## 2019-12-09 DIAGNOSIS — N183 Chronic kidney disease, stage 3 unspecified: Secondary | ICD-10-CM | POA: Diagnosis not present

## 2019-12-09 DIAGNOSIS — Z8673 Personal history of transient ischemic attack (TIA), and cerebral infarction without residual deficits: Secondary | ICD-10-CM | POA: Diagnosis not present

## 2019-12-09 DIAGNOSIS — N1832 Chronic kidney disease, stage 3b: Secondary | ICD-10-CM | POA: Insufficient documentation

## 2019-12-09 DIAGNOSIS — Z79899 Other long term (current) drug therapy: Secondary | ICD-10-CM | POA: Diagnosis not present

## 2019-12-09 DIAGNOSIS — I214 Non-ST elevation (NSTEMI) myocardial infarction: Secondary | ICD-10-CM | POA: Diagnosis not present

## 2019-12-09 DIAGNOSIS — I2510 Atherosclerotic heart disease of native coronary artery without angina pectoris: Secondary | ICD-10-CM | POA: Diagnosis not present

## 2019-12-09 DIAGNOSIS — I43 Cardiomyopathy in diseases classified elsewhere: Secondary | ICD-10-CM | POA: Insufficient documentation

## 2019-12-09 DIAGNOSIS — E854 Organ-limited amyloidosis: Secondary | ICD-10-CM | POA: Diagnosis not present

## 2019-12-09 DIAGNOSIS — Z9861 Coronary angioplasty status: Secondary | ICD-10-CM | POA: Diagnosis not present

## 2019-12-09 DIAGNOSIS — I4891 Unspecified atrial fibrillation: Secondary | ICD-10-CM | POA: Diagnosis not present

## 2019-12-09 DIAGNOSIS — Z794 Long term (current) use of insulin: Secondary | ICD-10-CM | POA: Diagnosis not present

## 2019-12-09 DIAGNOSIS — Z6831 Body mass index (BMI) 31.0-31.9, adult: Secondary | ICD-10-CM | POA: Insufficient documentation

## 2019-12-09 DIAGNOSIS — I248 Other forms of acute ischemic heart disease: Principal | ICD-10-CM | POA: Insufficient documentation

## 2019-12-09 DIAGNOSIS — I480 Paroxysmal atrial fibrillation: Secondary | ICD-10-CM

## 2019-12-09 DIAGNOSIS — E1122 Type 2 diabetes mellitus with diabetic chronic kidney disease: Secondary | ICD-10-CM | POA: Insufficient documentation

## 2019-12-09 DIAGNOSIS — I130 Hypertensive heart and chronic kidney disease with heart failure and stage 1 through stage 4 chronic kidney disease, or unspecified chronic kidney disease: Secondary | ICD-10-CM | POA: Diagnosis not present

## 2019-12-09 DIAGNOSIS — Z89432 Acquired absence of left foot: Secondary | ICD-10-CM | POA: Diagnosis not present

## 2019-12-09 LAB — TROPONIN I (HIGH SENSITIVITY)
Troponin I (High Sensitivity): 24 ng/L — ABNORMAL HIGH (ref <18)
Troponin I (High Sensitivity): 571 ng/L (ref <18)
Troponin I (High Sensitivity): 916 ng/L (ref <18)
Troponin I (High Sensitivity): 893 ng/L (ref <18)

## 2019-12-09 LAB — APTT: aPTT: 38 seconds — ABNORMAL HIGH (ref 24–36)

## 2019-12-09 LAB — BASIC METABOLIC PANEL
Anion gap: 12 (ref 5–15)
BUN: 53 mg/dL — ABNORMAL HIGH (ref 8–23)
CO2: 24 mmol/L (ref 22–32)
Calcium: 9 mg/dL (ref 8.9–10.3)
Chloride: 105 mmol/L (ref 98–111)
Creatinine, Ser: 2.47 mg/dL — ABNORMAL HIGH (ref 0.44–1.00)
GFR calc Af Amer: 22 mL/min — ABNORMAL LOW (ref >60)
GFR calc non Af Amer: 19 mL/min — ABNORMAL LOW (ref >60)
Glucose, Bld: 312 mg/dL — ABNORMAL HIGH (ref 70–99)
Potassium: 4 mmol/L (ref 3.5–5.1)
Sodium: 141 mmol/L (ref 135–145)

## 2019-12-09 LAB — PROTIME-INR
INR: 1.3 — ABNORMAL HIGH (ref 0.8–1.2)
Prothrombin Time: 15.8 seconds — ABNORMAL HIGH (ref 11.4–15.2)

## 2019-12-09 LAB — BRAIN NATRIURETIC PEPTIDE: B Natriuretic Peptide: 161.9 pg/mL — ABNORMAL HIGH (ref 0.0–100.0)

## 2019-12-09 LAB — HEPARIN LEVEL (UNFRACTIONATED): Heparin Unfractionated: 1.34 IU/mL — ABNORMAL HIGH (ref 0.30–0.70)

## 2019-12-09 LAB — CBC
HCT: 38.8 % (ref 36.0–46.0)
Hemoglobin: 12 g/dL (ref 12.0–15.0)
MCH: 25.5 pg — ABNORMAL LOW (ref 26.0–34.0)
MCHC: 30.9 g/dL (ref 30.0–36.0)
MCV: 82.6 fL (ref 80.0–100.0)
Platelets: 208 10*3/uL (ref 150–400)
RBC: 4.7 MIL/uL (ref 3.87–5.11)
RDW: 15.9 % — ABNORMAL HIGH (ref 11.5–15.5)
WBC: 8.6 10*3/uL (ref 4.0–10.5)
nRBC: 0 % (ref 0.0–0.2)

## 2019-12-09 LAB — CBG MONITORING, ED: Glucose-Capillary: 370 mg/dL — ABNORMAL HIGH (ref 70–99)

## 2019-12-09 LAB — SARS CORONAVIRUS 2 (TAT 6-24 HRS): SARS Coronavirus 2: NEGATIVE

## 2019-12-09 LAB — MAGNESIUM: Magnesium: 2.2 mg/dL (ref 1.7–2.4)

## 2019-12-09 MED ORDER — APIXABAN 5 MG PO TABS
5.0000 mg | ORAL_TABLET | Freq: Two times a day (BID) | ORAL | Status: DC
  Filled 2019-12-09 (×2): qty 1

## 2019-12-09 MED ORDER — INSULIN ASPART 100 UNIT/ML ~~LOC~~ SOLN
0.0000 [IU] | Freq: Three times a day (TID) | SUBCUTANEOUS | Status: DC
  Administered 2019-12-09: 15 [IU] via SUBCUTANEOUS
  Administered 2019-12-10: 8 [IU] via SUBCUTANEOUS
  Administered 2019-12-10: 2 [IU] via SUBCUTANEOUS

## 2019-12-09 MED ORDER — CARVEDILOL 25 MG PO TABS
25.0000 mg | ORAL_TABLET | Freq: Two times a day (BID) | ORAL | Status: DC
  Administered 2019-12-09 – 2019-12-10 (×2): 25 mg via ORAL
  Filled 2019-12-09 (×2): qty 2

## 2019-12-09 MED ORDER — PRAVASTATIN SODIUM 40 MG PO TABS
80.0000 mg | ORAL_TABLET | Freq: Every day | ORAL | Status: DC
  Administered 2019-12-09: 80 mg via ORAL
  Filled 2019-12-09: qty 2

## 2019-12-09 MED ORDER — FENOFIBRATE 160 MG PO TABS
160.0000 mg | ORAL_TABLET | Freq: Every day | ORAL | Status: DC
  Administered 2019-12-10: 160 mg via ORAL
  Filled 2019-12-09 (×3): qty 1

## 2019-12-09 MED ORDER — SODIUM CHLORIDE 0.9% FLUSH: 3.0000 mL | INTRAVENOUS | Status: DC | PRN

## 2019-12-09 MED ORDER — ACETAMINOPHEN 325 MG PO TABS: 650.0000 mg | ORAL_TABLET | ORAL | Status: DC | PRN

## 2019-12-09 MED ORDER — INSULIN ASPART 100 UNIT/ML ~~LOC~~ SOLN: 0.0000 [IU] | Freq: Every day | SUBCUTANEOUS | Status: DC

## 2019-12-09 MED ORDER — TORSEMIDE 20 MG PO TABS
20.0000 mg | ORAL_TABLET | Freq: Every day | ORAL | Status: DC
  Administered 2019-12-10: 20 mg via ORAL
  Filled 2019-12-09: qty 1

## 2019-12-09 MED ORDER — SODIUM CHLORIDE 0.9 % IV SOLN: 250.0000 mL | INTRAVENOUS | Status: DC | PRN

## 2019-12-09 MED ORDER — ONDANSETRON 4 MG PO TBDP: 4.0000 mg | ORAL_TABLET | Freq: Three times a day (TID) | ORAL | Status: DC | PRN

## 2019-12-09 MED ORDER — MECLIZINE HCL 25 MG PO TABS: 12.5000 mg | ORAL_TABLET | Freq: Three times a day (TID) | ORAL | Status: DC | PRN

## 2019-12-09 MED ORDER — ONDANSETRON HCL 4 MG/2ML IJ SOLN: 4.0000 mg | Freq: Four times a day (QID) | INTRAMUSCULAR | Status: DC | PRN

## 2019-12-09 MED ORDER — DILTIAZEM HCL-DEXTROSE 125-5 MG/125ML-% IV SOLN (PREMIX)
5.0000 mg/h | INTRAVENOUS | Status: DC
  Filled 2019-12-09: qty 125

## 2019-12-09 MED ORDER — HYDRALAZINE HCL 10 MG PO TABS
10.0000 mg | ORAL_TABLET | Freq: Two times a day (BID) | ORAL | Status: DC
  Administered 2019-12-09 – 2019-12-10 (×3): 10 mg via ORAL
  Filled 2019-12-09 (×4): qty 1

## 2019-12-09 MED ORDER — SODIUM CHLORIDE 0.9 % IV BOLUS (SEPSIS): 1000.0000 mL | Freq: Once | INTRAVENOUS | Status: DC

## 2019-12-09 MED ORDER — ASPIRIN 81 MG PO CHEW
324.0000 mg | CHEWABLE_TABLET | Freq: Once | ORAL | Status: DC
  Filled 2019-12-09: qty 4

## 2019-12-09 MED ORDER — FUROSEMIDE 10 MG/ML IJ SOLN
40.0000 mg | Freq: Once | INTRAMUSCULAR | Status: AC
  Administered 2019-12-09: 40 mg via INTRAVENOUS
  Filled 2019-12-09: qty 4

## 2019-12-09 MED ORDER — HEPARIN (PORCINE) 25000 UT/250ML-% IV SOLN
1300.0000 [IU]/h | INTRAVENOUS | Status: DC
  Administered 2019-12-09: 1000 [IU]/h via INTRAVENOUS
  Filled 2019-12-09: qty 250

## 2019-12-09 MED ORDER — SODIUM CHLORIDE 0.9% FLUSH
3.0000 mL | Freq: Two times a day (BID) | INTRAVENOUS | Status: DC
  Administered 2019-12-09 – 2019-12-10 (×2): 3 mL via INTRAVENOUS

## 2019-12-09 NOTE — ED Notes (Signed)
Date and time results received: 12/09/19 1254 (use smartphrase ".now" to insert current time)  Test: troponin  Critical Value: 571  Name of Provider Notified: Dr. Alvino Chapel  Orders Received? Or Actions Taken?: Actions Taken: MD to bedside, states plans to follow up with consulting physicians, no new nursing orders at this time

## 2019-12-09 NOTE — ED Provider Notes (Signed)
TIME SEEN: 5:54 AM  CHIEF COMPLAINT: Chest pain, shortness of breath, lightheadedness  HPI: Patient is a 69 year old female with history of atrial fibrillation on Eliquis, CAD, severe aortic stenosis status post TAVR, hypertension, hyperlipidemia, diabetes, TIA who presents to the emergency department chest pain that she describes as feeling like "indigestion" that started at 2:30 AM when she got up to go to the bathroom from bed.  Also complains of shortness of breath and feeling lightheaded.  No diaphoresis.  No recent fevers, cough, vomiting, diarrhea.  Reports that she has a difficult time remembering to take her medications and thinks that she may have missed doses of Eliquis in the past 30 days.  Her cardiologist is Dr. Aundra Dubin.  CHADs Vasc 2 = 6  ROS: See HPI Constitutional: no fever  Eyes: no drainage  ENT: no runny nose   Cardiovascular:   chest pain  Resp:  SOB  GI: no vomiting GU: no dysuria Integumentary: no rash  Allergy: no hives  Musculoskeletal: no leg swelling  Neurological: no slurred speech ROS otherwise negative  PAST MEDICAL HISTORY/PAST SURGICAL HISTORY:  Past Medical History:  Diagnosis Date  . Atrial fibrillation ([place])    a. maintaining sinus s/p DCCV, on Eliquis  . CAD (coronary artery disease)    a. Nonobstructive by cath 2006 - 25% LAD, 25-30% after 1st diag, distal 25% PDA, minor irreg LCx. b. Normal nuc 2011.  . CKD (chronic kidney disease), stage III   . Cystocele   . Difficult intubation 09-07-2012   big neck trouble with intubation parotid surgery"takes little med to sedate"  . Esophageal stricture   . GERD (gastroesophageal reflux disease)   . Hiatal hernia    a. 07/2013: HH with small stricture holding up barium tablet (concides with patient's sx of food sticking).  . History of kidney stones   . Hyperlipidemia   . Hypertension   . IBS (irritable bowel syndrome)   . Memory difficulty 11/26/2015  . Morbid obesity ([place])    a. hx of gastric  bypass (sleeve gastrectomy 2015)  . Osteoporosis   . 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  Marland Kitchen Pericardial effusion    a. Mod by echo 2012 at time of PNA. b. Echo 07/2012: small pericardial effusion vs fat.  . Pressure ulcer 04/16/2019   right foot  . S/P TAVR (transcatheter aortic valve replacement)   . Severe aortic stenosis   . Sleep apnea     "have mask; don't use it" (02/14/2018)  . TIA (transient ischemic attack) 10/2014; 06/2016   "some memory issues since; daughter says my speech is sometimes different" (02/14/2018)  . Type II diabetes mellitus ([facility])     MEDICATIONS:  Prior to Admission medications   Medication Sig Start Date End Date Taking? Authorizing Provider  ACCU-CHEK AVIVA PLUS test strip USE TO CHECK BLOOD SUGAR UP TO TWICE DAILY OR AS INSTRUCTED 09/04/17   Chipper Herb, MD  apixaban (ELIQUIS) 5 MG TABS tablet Take 1 tablet (5 mg total) by mouth 2 (two) times daily. 10/04/19   Minus Breeding, MD  B-D INS SYR ULTRAFINE 1CC/31G 31G X 5/16" 1 ML MISC USE TO INJECT INSULIN TWICE A DAY AS INSTRUCTED Patient taking differently: 1 Stick by Other route 3 (three) times daily before meals.  11/16/15   Chipper Herb, MD  Calcium Carbonate-Vitamin D (CALCIUM 600+D) 600-400 MG-UNIT tablet Take 1 tablet by mouth daily.    [provider]  carvedilol (COREG) 25  MG tablet Take 25 mg by mouth 2 (two) times daily with a meal.    [provider]  Cholecalciferol (VITAMIN D3) 50 MCG (2000 UT) TABS Take 2,000 Units by mouth daily.    [provider]  fenofibrate (TRICOR) 145 MG tablet Take 1 tablet (145 mg total) by mouth daily. 06/17/19   Larey Dresser, MD  hydrALAZINE (APRESOLINE) 10 MG tablet Take 10 mg by mouth 2 (two) times daily.     [provider]  icosapent Ethyl (VASCEPA) 1 g capsule Take 2 g by mouth 2 (two) times daily.     [provider]  insulin NPH Human (NOVOLIN N) 100 UNIT/ML injection Inject into the skin. 50  am 50 lunch 25 bedtime sliding scale    [provider]  Insulin Pen Needle 32G X 4 MM MISC Use to inject insulin with insulin pen 05/17/16   Chipper Herb, MD  LIVALO 4 MG TABS Take 4 mg by mouth every evening.    [provider]  MAGNESIUM PO Take by mouth daily as needed.    [provider]  meclizine (ANTIVERT) 12.5 MG tablet Take 1 tablet (12.5 mg total) by mouth 3 (three) times daily as needed for dizziness. 06/14/19   Larey Dresser, MD  Multiple Vitamins-Minerals (MULTIVITAMIN GUMMIES ADULT PO) Take 2 tablets by mouth daily.    [provider]  Multiple Vitamins-Minerals (ZINC PO) Take by mouth daily as needed.    [provider]  nutrition supplement, JUVEN, (JUVEN) PACK Take 1 packet by mouth 2 (two) times daily between meals. 04/26/19   Swayze, Ava, DO  ondansetron (ZOFRAN ODT) 4 MG disintegrating tablet Take 1 tablet (4 mg total) by mouth every 8 (eight) hours as needed for nausea. 01/03/17   Larene Pickett, PA-C  PRALUENT 75 MG/ML SOAJ INJECT 75 MG INTO THE SKIN EVERY 14 (FOURTEEN) DAYS. 11/18/19   Larey Dresser, MD  Tafamidis [facility]) 61 MG CAPS Take 61 mg by mouth daily. 07/29/19   Larey Dresser, MD  torsemide (DEMADEX) 20 MG tablet Take 1 tablet (20 mg total) by mouth daily. 11/19/19   Larey Dresser, MD  Olmesartan-Amlodipine-HCTZ [facility]) 40-5-12.5 MG TABS Take 1 tablet by mouth daily.    04/12/18  [provider]    ALLERGIES:  Allergies  Allergen Reactions  . Amlodipine Swelling    Ankle swelling  . Atorvastatin Other (See Comments)    Myalgias   . Crestor [Rosuvastatin Calcium] Other (See Comments)    Stiffness and back pain  . Ezetimibe-Simvastatin Other (See Comments)    Leg cramps  . Sitagliptin Other (See Comments)  . Statins Other (See Comments)    Myalgias  . Celebrex [Celecoxib] Rash  . Levemir [Insulin Detemir] Rash    SOCIAL HISTORY:  Social History   Tobacco Use  . Smoking status:  Never Smoker  . Smokeless tobacco: Never Used  Substance Use Topics  . Alcohol use: Yes    Alcohol/week: 0.0 standard drinks    Comment:  "mixed drink q few years"    FAMILY HISTORY: Family History  Problem Relation Age of Onset  . Heart failure Mother   . Hypertension Mother   . Skin cancer Mother   . Colitis Mother   . Diabetes Mother   . Kidney Stones Mother   . Stroke Mother   . Heart disease Father   . Colon polyps Father   . Diabetes Father   . Heart failure Father   .  Heart attack Sister   . Diabetes Other   . Stroke Maternal Grandfather   . Diabetes Maternal Grandfather   . Colon cancer Neg Hx     EXAM: BP 102/63 (BP Location: Left Arm)   Pulse (!) 126   Temp 97.8 F (36.6 C) (Tympanic)   Resp 16   SpO2 99%  CONSTITUTIONAL: Alert and oriented and responds appropriately to questions.  Elderly, obese HEAD: Normocephalic EYES: Conjunctivae clear, pupils appear equal, EOM appear intact ENT: normal nose; moist mucous membranes NECK: Supple, normal ROM CARD: Irregularly irregular and tachycardic; S1 and S2 appreciated; no murmurs, no clicks, no rubs, no gallops RESP: Normal chest excursion without splinting or tachypnea; breath sounds clear and equal bilaterally; no wheezes, no rhonchi, no rales, no hypoxia or respiratory distress, speaking full sentences ABD/GI: Normal bowel sounds; non-distended; soft, non-tender, no rebound, no guarding, no peritoneal signs, no hepatosplenomegaly BACK:  The back appears normal EXT: Normal ROM in all joints; no deformity noted, no edema; no cyanosis, no calf tenderness or calf swelling SKIN: Normal color for age and race; warm; no rash on exposed skin NEURO: Moves all extremities equally PSYCH: The patient's mood and manner are appropriate.   MEDICAL DECISION MAKING: Patient here with chest pain, shortness of breath.  Found to be in atrial fibrillation with RVR.  States she is not sure but thinks she probably missed doses of  Eliquis over the past month.  Not a candidate for electric cardioversion in the emergency department.  Will start diltiazem infusion.  Will obtain cardiac labs, chest x-ray.  EKG reviewed/interpreted and shows rate related changes but is not a STEMI.  Will give aspirin.  Patient will need medical admission.  ED PROGRESS: 6:45 AM  Pt has spontaneously converted back into a normal rhythm and now is asymptomatic.  She did have one low blood pressure documented but was immediately rechecked and is normotensive.  We will hold IV fluids and IV Cardizem at this time.  Will repeat EKG.  7:00 Labs pending.  Signed out to Dr. Alvino Chapel.  Repeat EKG appears similar to EKG in December 2020.  I reviewed all nursing notes and pertinent previous records as available.  I have reviewed and interpreted any EKGs, lab and urine results, imaging (as available).    EKG Interpretation  Date/Time:  Monday December 09 2019 05:33:38 EDT Ventricular Rate:  139 PR Interval:    QRS Duration: 84 QT Interval:  338 QTC Calculation: 514 R Axis:   -22 Text Interpretation: Atrial fibrillation with rapid ventricular response Anterior infarct , age undetermined Marked ST abnormality, possible lateral subendocardial injury Abnormal ECG Confirmed by Pryor Curia [phone]) on 12/09/2019 5:51:15 AM       EKG Interpretation  Date/Time:  Monday December 09 2019 06:50:06 EDT Ventricular Rate:  77 PR Interval:    QRS Duration: 93 QT Interval:  411 QTC Calculation: 466 R Axis:   11 Text Interpretation: Sinus rhythm Anterior infarct, old Repol abnrm suggests ischemia, lateral leads No significant change since last tracing in December 2020 A fib has resolved Confirmed by Pryor Curia [phone]) on 12/09/2019 6:59:08 AM       CRITICAL CARE Performed by: Cyril Mourning Iyana Topor   Total critical care time: 45 minutes  Critical care time was exclusive of separately billable procedures and treating other patients.  Critical care was necessary to  treat or prevent imminent or life-threatening deterioration.  Critical care was time spent personally by me on the following activities: development of treatment plan  with patient and/or surrogate as well as nursing, discussions with consultants, evaluation of patient's response to treatment, examination of patient, obtaining history from patient or surrogate, ordering and performing treatments and interventions, ordering and review of laboratory studies, ordering and review of radiographic studies, pulse oximetry and re-evaluation of patient's condition.   Cayle P Chieffo was evaluated in Emergency Department on 12/09/2019 for the symptoms described in the history of present illness. She was evaluated in the context of the global COVID-19 pandemic, which necessitated consideration that the patient might be at risk for infection with the SARS-CoV-2 virus that causes COVID-19. Institutional protocols and algorithms that pertain to the evaluation of patients at risk for COVID-19 are in a state of rapid change based on information released by regulatory bodies including the CDC and federal and state organizations. These policies and algorithms were followed during the patient's care in the ED.      Junko Ohagan, Delice Bison, DO 12/09/19 0700

## 2019-12-09 NOTE — Progress Notes (Signed)
ANTICOAGULATION CONSULT NOTE - Initial Consult  Pharmacy Consult for heparin  Indication: chest pain/ACS  Allergies  Allergen Reactions  . Amlodipine Swelling    Ankle swelling  . Atorvastatin Other (See Comments)    Myalgias   . Crestor [Rosuvastatin Calcium] Other (See Comments)    Stiffness and back pain  . Ezetimibe-Simvastatin Other (See Comments)    Leg cramps  . Sitagliptin Other (See Comments)  . Statins Other (See Comments)    Myalgias  . Celebrex [Celecoxib] Rash  . Levemir [Insulin Detemir] Rash    Patient Measurements: Height: 5\' 7"  (170.2 cm) Weight: 90.7 kg (200 lb) IBW/kg (Calculated) : 61.6 Heparin Dosing Weight: 81.1 kg   Vital Signs: Temp: 97.8 F (36.6 C) (04/26 0538) Temp Source: Tympanic (04/26 0538) BP: 145/61 (04/26 1230) Pulse Rate: 79 (04/26 1230)  Labs: Recent Labs    12/09/19 0550 12/09/19 1130  HGB 12.0  --   HCT 38.8  --   PLT 208  --   LABPROT 15.8*  --   INR 1.3*  --   CREATININE 2.47*  --   TROPONINIHS 24* 571*    Estimated Creatinine Clearance: 25.2 mL/min (A) (by C-G formula based on SCr of 2.47 mg/dL (H)).   Medical History: Past Medical History:  Diagnosis Date  . Atrial fibrillation ([place])    a. maintaining sinus s/p DCCV, on Eliquis  . CAD (coronary artery disease)    a. Nonobstructive by cath 2006 - 25% LAD, 25-30% after 1st diag, distal 25% PDA, minor irreg LCx. b. Normal nuc 2011.  . CKD (chronic kidney disease), stage III   . Cystocele   . Difficult intubation 09-07-2012   big neck trouble with intubation parotid surgery"takes little med to sedate"  . Esophageal stricture   . GERD (gastroesophageal reflux disease)   . Hiatal hernia    a. 07/2013: HH with small stricture holding up barium tablet (concides with patient's sx of food sticking).  . History of kidney stones   . Hyperlipidemia   . Hypertension   . IBS (irritable bowel syndrome)   . Memory difficulty 11/26/2015  . Morbid obesity ([place])    a. hx of  gastric bypass (sleeve gastrectomy 2015)  . Osteoporosis   . 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  Marland Kitchen Pericardial effusion    a. Mod by echo 2012 at time of PNA. b. Echo 07/2012: small pericardial effusion vs fat.  . Pressure ulcer 04/16/2019   right foot  . S/P TAVR (transcatheter aortic valve replacement)   . Severe aortic stenosis   . Sleep apnea     "have mask; don't use it" (02/14/2018)  . TIA (transient ischemic attack) 10/2014; 06/2016   "some memory issues since; daughter says my speech is sometimes different" (02/14/2018)  . Type II diabetes mellitus ([facility])     Medications:  Scheduled:  . aspirin  324 mg Oral Once  . carvedilol  25 mg Oral BID WC  . fenofibrate  160 mg Oral Daily  . hydrALAZINE  10 mg Oral BID  . insulin aspart  0-15 Units Subcutaneous TID WC  . insulin aspart  0-5 Units Subcutaneous QHS  . pravastatin  80 mg Oral q1800  . sodium chloride flush  3 mL Intravenous Q12H  . [START ON 12/10/2019] torsemide  20 mg Oral Daily    Assessment: 75 yof presented with chest pain, converted from Afib in ED. On apixaban PTA - LD 4/25.   Hgb 12,  plt 208. Troponin 24>571. No s/sx of bleeding.   Given recent apixaban dosing, will monitor both aPTT and heparin level until correlate.   Goal of Therapy:  Heparin level 0.3-0.7 units/ml aPTT 66-102 seconds Monitor platelets by anticoagulation protocol: Yes   Plan:  Start heparin infusion at 1000 units/hr Check anti-Xa and aPTT level in 8 hours and daily while on heparin Continue to monitor H&H and platelets  Antonietta Jewel, PharmD, BCCCP Clinical Pharmacist  Phone: [phone] 12/09/2019 1:47 PM  Please check AMION for all [facility] phone numbers After 10:00 PM, call [place] [phone]

## 2019-12-09 NOTE — ED Provider Notes (Signed)
  Physical Exam  BP (!) 145/61   Pulse 79   Temp 97.8 F (36.6 C) (Tympanic)   Resp 12   SpO2 95%   Physical Exam  ED Course/Procedures     Procedures  MDM  Received patient in signout.  A. fib with RVR.  History of same.  Also history of heart failure.  Somewhat volume overloaded.  Seen by heart failure in the ER.  If troponins were stable plan for discharge home with adjustment of medications after IV diuresis, however troponin went up by about 500.  Will be admitted.       Davonna Belling, MD 12/09/19 [phone]

## 2019-12-09 NOTE — Consult Note (Addendum)
Advanced Heart Failure Team H&P    Primary Physician: [name], DO PCP-Cardiologist:  Minus Breeding, MD  HF MD: Dr Aundra Dubin   Reason for Consultation: A/C Diastolic HF   HPI:    [name] is seen today for evaluation of heart failure at the request of Dr Alvino Chapel.   [name] is a 69 year old with history of AS s/p TAVR, chronic diastolic CHF, PAF, CKD III, HLD, HTN, OSA, TTR amyloidosis, PAD, and morbid obesity.   She was admitted from [facility] office2/5/20with marked volume overload. She diuresed 36 lbs with lasix drip and was transitioned to torsemide 40 mg daily. Course complicated by AKI. RHC completedand showed elevated filling pressures and preserved cardiac output.PYP scan suggestive of TTR amyloid. Discharge weight was 208 pounds.   She developed a non-healing right heal ulcer followed by a podiatrist in [place].  She ended up having a peripheral angiogram in [place] with PTCA to the right popliteal and right AT.    In 9/20, she was admitted with fever and found to have Enterococcal bacteremia.  TEE was concerning for possible MV vegetation, bioprosthetic aortic valve looked ok.  She was treated with IV antibiotics, and repeat TEE in 10/20 did not show residual active endocarditis.    She is now s/p left distal SFA-tibioperoneal trunk bypass with left TMA done at [facility] in 2/21.   She has been followed by Dr Aundra Dubin in the HF clinic and was last seen  on 11/18/19. At that time she was stable and continues on torsemide 20 mg twice a day alternating with 20 mg daily. She was later told to cut back torsemide to 20 mg daily due to elevated creatinine.   Weight at home has been stable. Last night she had a spicy snack and thought that gave her indigestion.  Thinks she has missed one dose of eliquis. Functionally limited due to L transmet and old fracture. Occasionally can use walker but mainly uses wheel chair.   Presented to [facility] ED with chest pain  and dizziness. Says she had chest pain/ indigestion that started around 02:30. Pain didn't go away so she called 911. She was instructed to take 4 baby aspirin. On arrival in the ED she was in A fib RVR and had initially planned for diltiazem but spontaneously converted to NSR. CXR concerning for pulmonary edema. Pertinent labs: BNP 161, Creatinine 2.47, K 4 ,  INR 1.3, HS Trop 24, and SARS2 negative.    She is no longer having chest pain or dizziness.  Most recent cardiac studies TEE Echo 05/2019  EF 60-65%, LA moderately dilated, RV dilated.  [place] 09/2018    1. Elevated right and left heart filling pressures.  2. Pulmonary venous hypertension.  3. Preserved cardiac output.   LHC 03/29/2018  1. Moderate disease in the ostium of the RCA and in the mid RCA.  2. Moderate disease in the proximal segment of the Circumflex and in the distal OM branch.  3. Mild proximal LAD stenosis. The distal LAD tapers to a small caliber vessel and is diffusely diseased with 99% stenosis, too small for PCI.   Review of Systems: [y] = yes, [ ]  = no   . General: Weight gain [ ] ; Weight loss [ ] ; Anorexia [ ] ; Fatigue [ Y]; Fever [ ] ; Chills [ ] ; Weakness [ ]   . Cardiac: Chest pain/pressure [ ] ; Resting SOB [ ] ; Exertional SOB [Y ]; Orthopnea [ ] ; Pedal Edema [ ] ;  Palpitations [ ] ; Syncope [ ] ; Presyncope [ ] ; Paroxysmal nocturnal dyspnea[ ]   . Pulmonary: Cough [ ] ; Wheezing[ ] ; Hemoptysis[ ] ; Sputum [ ] ; Snoring [ ]   . GI: Vomiting[ ] ; Dysphagia[ ] ; Melena[ ] ; Hematochezia [ ] ; Heartburn[ ] ; Abdominal pain [ ] ; Constipation [ ] ; Diarrhea [ ] ; BRBPR [ ]   . GU: Hematuria[ ] ; Dysuria [ ] ; Nocturia[ ]   . Vascular: Pain in legs with walking [ Y]; Pain in feet with lying flat [ ] ; Non-healing sores [ ] ; Stroke [ ] ; TIA [ ] ; Slurred speech [ ] ;  . Neuro: Headaches[ ] ; Vertigo[ ] ; Seizures[ ] ; Paresthesias[ ] ;Blurred vision [ ] ; Diplopia [ ] ; Vision changes [ ]   . Ortho/Skin: Arthritis [ ] ; Joint pain [Y ]; Muscle pain [  ]; Joint swelling [ ] ; Back Pain [Y ]; Rash [ ]   . Psych: Depression[ ] ; Anxiety[ ]   . Heme: Bleeding problems [ ] ; Clotting disorders [ ] ; Anemia [ ]   . Endocrine: Diabetes [Y ]; Thyroid dysfunction[ ]   Home Medications Prior to Admission medications   Medication Sig Start Date End Date Taking? Authorizing Provider  ACCU-CHEK AVIVA PLUS test strip USE TO CHECK BLOOD SUGAR UP TO TWICE DAILY OR AS INSTRUCTED 09/04/17   Chipper Herb, MD  apixaban (ELIQUIS) 5 MG TABS tablet Take 1 tablet (5 mg total) by mouth 2 (two) times daily. 10/04/19   Minus Breeding, MD  B-D INS SYR ULTRAFINE 1CC/31G 31G X 5/16" 1 ML MISC USE TO INJECT INSULIN TWICE A DAY AS INSTRUCTED Patient taking differently: 1 Stick by Other route 3 (three) times daily before meals.  11/16/15   Chipper Herb, MD  Calcium Carbonate-Vitamin D (CALCIUM 600+D) 600-400 MG-UNIT tablet Take 1 tablet by mouth daily.    [provider]  carvedilol (COREG) 25 MG tablet Take 25 mg by mouth 2 (two) times daily with a meal.    [provider]  Cholecalciferol (VITAMIN D3) 50 MCG (2000 UT) TABS Take 2,000 Units by mouth daily.    [provider]  fenofibrate (TRICOR) 145 MG tablet Take 1 tablet (145 mg total) by mouth daily. 06/17/19   Larey Dresser, MD  hydrALAZINE (APRESOLINE) 10 MG tablet Take 10 mg by mouth 2 (two) times daily.     [provider]  icosapent Ethyl (VASCEPA) 1 g capsule Take 2 g by mouth 2 (two) times daily.     [provider]  insulin NPH Human (NOVOLIN N) 100 UNIT/ML injection Inject into the skin. 50 am 50 lunch 25 bedtime sliding scale    [provider]  Insulin Pen Needle 32G X 4 MM MISC Use to inject insulin with insulin pen 05/17/16   Chipper Herb, MD  LIVALO 4 MG TABS Take 4 mg by mouth every evening.    [provider]  MAGNESIUM PO Take by mouth daily as needed.    [provider]  meclizine (ANTIVERT) 12.5 MG tablet Take 1 tablet (12.5 mg  total) by mouth 3 (three) times daily as needed for dizziness. 06/14/19   Larey Dresser, MD  Multiple Vitamins-Minerals (MULTIVITAMIN GUMMIES ADULT PO) Take 2 tablets by mouth daily.    [provider]  Multiple Vitamins-Minerals (ZINC PO) Take by mouth daily as needed.    [provider]  nutrition supplement, JUVEN, (JUVEN) PACK Take 1 packet by mouth 2 (two) times daily between meals. 04/26/19   Swayze, Ava, DO  ondansetron (ZOFRAN ODT) 4 MG disintegrating tablet Take  1 tablet (4 mg total) by mouth every 8 (eight) hours as needed for nausea. 01/03/17   Larene Pickett, PA-C  PRALUENT 75 MG/ML SOAJ INJECT 75 MG INTO THE SKIN EVERY 14 (FOURTEEN) DAYS. 11/18/19   Larey Dresser, MD  Tafamidis [facility]) 61 MG CAPS Take 61 mg by mouth daily. 07/29/19   Larey Dresser, MD  torsemide (DEMADEX) 20 MG tablet Take 1 tablet (20 mg total) by mouth daily. 11/19/19   Larey Dresser, MD  Olmesartan-Amlodipine-HCTZ [facility]) 40-5-12.5 MG TABS Take 1 tablet by mouth daily.    04/12/18  [provider]    Past Medical History: Past Medical History:  Diagnosis Date  . Atrial fibrillation ([place])    a. maintaining sinus s/p DCCV, on Eliquis  . CAD (coronary artery disease)    a. Nonobstructive by cath 2006 - 25% LAD, 25-30% after 1st diag, distal 25% PDA, minor irreg LCx. b. Normal nuc 2011.  . CKD (chronic kidney disease), stage III   . Cystocele   . Difficult intubation 09-07-2012   big neck trouble with intubation parotid surgery"takes little med to sedate"  . Esophageal stricture   . GERD (gastroesophageal reflux disease)   . Hiatal hernia    a. 07/2013: HH with small stricture holding up barium tablet (concides with patient's sx of food sticking).  . History of kidney stones   . Hyperlipidemia   . Hypertension   . IBS (irritable bowel syndrome)   . Memory difficulty 11/26/2015  . Morbid obesity ([place])    a. hx of gastric bypass (sleeve gastrectomy 2015)  . Osteoporosis    . 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  Marland Kitchen Pericardial effusion    a. Mod by echo 2012 at time of PNA. b. Echo 07/2012: small pericardial effusion vs fat.  . Pressure ulcer 04/16/2019   right foot  . S/P TAVR (transcatheter aortic valve replacement)   . Severe aortic stenosis   . Sleep apnea     "have mask; don't use it" (02/14/2018)  . TIA (transient ischemic attack) 10/2014; 06/2016   "some memory issues since; daughter says my speech is sometimes different" (02/14/2018)  . Type II diabetes mellitus ([place])     Past Surgical History: Past Surgical History:  Procedure Laterality Date  . BREATH TEK H PYLORI N/A 07/29/2013   Procedure: [place];  Surgeon: Shann Medal, MD;  Location: [facility];  Service: General;  Laterality: N/A;  . CARDIAC CATHETERIZATION  2006  . CARDIOVERSION N/A 02/16/2018   Procedure: CARDIOVERSION;  Surgeon: Pixie Casino, MD;  Location: [place];  Service: Cardiovascular;  Laterality: N/A;  . CARPAL TUNNEL RELEASE Left 2011  . CATARACT EXTRACTION W/ INTRAOCULAR LENS  IMPLANT, BILATERAL Bilateral 2003   left  . CHOLECYSTECTOMY OPEN  1982  . COLONOSCOPY N/A 07/14/2015   Procedure: COLONOSCOPY;  Surgeon: Ladene Artist, MD;  Location: [facility];  Service: Endoscopy;  Laterality: N/A;  . ESOPHAGOGASTRODUODENOSCOPY N/A 12/27/2013   Procedure: ESOPHAGOGASTRODUODENOSCOPY (EGD);  Surgeon: Shann Medal, MD;  Location: [facility];  Service: General;  Laterality: N/A;  . ESOPHAGOGASTRODUODENOSCOPY (EGD) WITH ESOPHAGEAL DILATION    . INTRAOPERATIVE TRANSTHORACIC ECHOCARDIOGRAM  05/08/2018   Procedure: INTRAOPERATIVE TRANSTHORACIC ECHOCARDIOGRAM;  Surgeon: Burnell Blanks, MD;  Location: [place];  Service: Open Heart Surgery;;  . IR FLUORO GUIDE CV LINE RIGHT  04/25/2019  . IR REMOVAL TUN CV CATH W/O FL  06/20/2019  . IR US GUIDE  VASC ACCESS RIGHT  04/25/2019  . LAPAROSCOPIC GASTRIC SLEEVE RESECTION N/A 01/20/2014    Procedure: LAPAROSCOPIC GASTRIC SLEEVE RESECTION;  Surgeon: Shann Medal, MD;  Location: [facility];  Service: General;  Laterality: N/A;  . PAROTIDECTOMY  09/07/2012   Procedure: PAROTIDECTOMY;  Surgeon: Melida Quitter, MD;  Location: [place];  Service: ENT;  Laterality: Left;  LEFT PAROTIDECTOMY  . RIGHT HEART CATH N/A 09/25/2018   Procedure: RIGHT HEART CATH;  Surgeon: Larey Dresser, MD;  Location: [place] [facility];  Service: Cardiovascular;  Laterality: N/A;  . RIGHT/LEFT HEART CATH AND CORONARY ANGIOGRAPHY N/A 03/29/2018   Procedure: RIGHT/LEFT HEART CATH AND CORONARY ANGIOGRAPHY;  Surgeon: Burnell Blanks, MD;  Location: [place] [facility];  Service: Cardiovascular;  Laterality: N/A;  . TEE WITHOUT CARDIOVERSION N/A 02/16/2018   Procedure: TRANSESOPHAGEAL ECHOCARDIOGRAM (TEE);  Surgeon: Pixie Casino, MD;  Location: [place];  Service: Cardiovascular;  Laterality: N/A;  . TEE WITHOUT CARDIOVERSION N/A 06/05/2019   Procedure: TRANSESOPHAGEAL ECHOCARDIOGRAM (TEE);  Surgeon: Sanda Klein, MD;  Location: [place];  Service: Cardiovascular;  Laterality: N/A;  . TEE WITHOUT CARDIOVERSION N/A 04/24/2019   Procedure: TRANSESOPHAGEAL ECHOCARDIOGRAM (TEE);  Surgeon: Sanda Klein, MD;  Location: [place];  Service: Cardiovascular;  Laterality: N/A;  . TOE AMPUTATION Left 07/2017   great toe; [facility]   . TONSILLECTOMY  1968  . TRANSCATHETER AORTIC VALVE REPLACEMENT, TRANSFEMORAL  05/08/2018   Transcatheter Aortic Valve Replacement   . TRANSCATHETER AORTIC VALVE REPLACEMENT, TRANSFEMORAL N/A 05/08/2018   Procedure: TRANSCATHETER AORTIC VALVE REPLACEMENT, TRANSFEMORAL;  Surgeon: Burnell Blanks, MD;  Location: [place];  Service: Open Heart Surgery;  Laterality: N/A;  . TUBAL LIGATION  yrs ago  . UPPER GI ENDOSCOPY  01/20/2014   Procedure: UPPER GI ENDOSCOPY;  Surgeon: Shann Medal, MD;  Location: [facility];  Service: General;;    Family History: Family History  Problem  Relation Age of Onset  . Heart failure Mother   . Hypertension Mother   . Skin cancer Mother   . Colitis Mother   . Diabetes Mother   . Kidney Stones Mother   . Stroke Mother   . Heart disease Father   . Colon polyps Father   . Diabetes Father   . Heart failure Father   . Heart attack Sister   . Diabetes Other   . Stroke Maternal Grandfather   . Diabetes Maternal Grandfather   . Colon cancer Neg Hx     Social History: Social History   Socioeconomic History  . Marital status: Married    Spouse name: Not on file  . Number of children: 3  . Years of education: some college  . Highest education level: Some college, no degree  Occupational History  . Occupation: retired    Fish farm manager: [employer]  Tobacco Use  . Smoking status: Never Smoker  . Smokeless tobacco: Never Used  Substance and Sexual Activity  . Alcohol use: Yes    Alcohol/week: 0.0 standard drinks    Comment:  "mixed drink q few years"  . Drug use: Never  . Sexual activity: Yes    Birth control/protection: Post-menopausal  Other Topics Concern  . Not on file  Social History Narrative   Married   Patient is right handed.   Patient rarely drinks caffeine.   Lives at home with husband in two story home. Husband washes laundry in the basement. They have 3 daughters and 7 grandchildren.    Social Determinants of Health  Financial Resource Strain:   . Difficulty of Paying Living Expenses:   Food Insecurity:   . Worried About Charity fundraiser in the Last Year:   . Arboriculturist in the Last Year:   Transportation Needs:   . Film/video editor (Medical):   Marland Kitchen Lack of Transportation (Non-Medical):   Physical Activity:   . Days of Exercise per Week:   . Minutes of Exercise per Session:   Stress:   . Feeling of Stress :   Social Connections:   . Frequency of Communication with Friends and Family:   . Frequency of Social Gatherings with Friends and Family:   . Attends Religious Services:   .  Active Member of Clubs or Organizations:   . Attends Archivist Meetings:   Marland Kitchen Marital Status:     Allergies:  Allergies  Allergen Reactions  . Amlodipine Swelling    Ankle swelling  . Atorvastatin Other (See Comments)    Myalgias   . Crestor [Rosuvastatin Calcium] Other (See Comments)    Stiffness and back pain  . Ezetimibe-Simvastatin Other (See Comments)    Leg cramps  . Sitagliptin Other (See Comments)  . Statins Other (See Comments)    Myalgias  . Celebrex [Celecoxib] Rash  . Levemir [Insulin Detemir] Rash    Objective:    Vital Signs:   Temp:  [97.8 F (36.6 C)] 97.8 F (36.6 C) (04/26 0538) Pulse Rate:  [76-137] 76 (04/26 0915) Resp:  [6-27] 14 (04/26 0915) BP: (89-128)/(53-71) 127/58 (04/26 0915) SpO2:  [91 %-99 %] 95 % (04/26 0915)    Weight change: There were no vitals filed for this visit.  Intake/Output:  No intake or output data in the 24 hours ending 12/09/19 1006    Physical Exam    General:  No resp difficulty HEENT: normal Neck: supple. JVP difficult to assess due to body habitus . Carotids 2+ bilat; no bruits. No lymphadenopathy or thyromegaly appreciated. Cor: PMI nondisplaced. Regular rate & rhythm. No rubs, gallops or murmurs. Lungs: clear Abdomen: obese, soft, nontender, nondistended. No hepatosplenomegaly. No bruits or masses. Good bowel sounds. Extremities: no cyanosis, clubbing, rash, edema. Left transmet amputation  Neuro: alert & orientedx3, cranial nerves grossly intact. moves all 4 extremities w/o difficulty. Affect pleasant   Telemetry   NSR 70-80s   EKG    A fib RVR on admit  Labs   Basic Metabolic Panel: Recent Labs  Lab 12/09/19 0550  NA 141  K 4.0  CL 105  CO2 24  GLUCOSE 312*  BUN 53*  CREATININE 2.47*  CALCIUM 9.0  MG 2.2    Liver Function Tests: No results for input(s): AST, ALT, ALKPHOS, BILITOT, PROT, ALBUMIN in the last 168 hours. No results for input(s): LIPASE, AMYLASE in the last 168  hours. No results for input(s): AMMONIA in the last 168 hours.  CBC: Recent Labs  Lab 12/09/19 0550  WBC 8.6  HGB 12.0  HCT 38.8  MCV 82.6  PLT 208    Cardiac Enzymes: No results for input(s): CKTOTAL, CKMB, CKMBINDEX, TROPONINI in the last 168 hours.  BNP: BNP (last 3 results) Recent Labs    12/09/19 0624  BNP 161.9*    ProBNP (last 3 results) No results for input(s): PROBNP in the last 8760 hours.   CBG: No results for input(s): GLUCAP in the last 168 hours.  Coagulation Studies: Recent Labs    12/09/19 0550  LABPROT 15.8*  INR 1.3*  Imaging   DG Chest Portable 1 View  Result Date: 12/09/2019 CLINICAL DATA:  Chest pain EXAM: PORTABLE CHEST 1 VIEW COMPARISON:  April 15, 2019 FINDINGS: There is unchanged cardiomegaly. The patient is status post TAVR. Aortic knob calcifications are seen. There is prominence of the central pulmonary vasculature. No large airspace consolidation or pleural effusion. No acute osseous abnormality. IMPRESSION: Cardiomegaly and pulmonary vascular congestion. Electronically Signed   By: Prudencio Pair M.D.   On: 12/09/2019 06:10      Medications:     Current Medications: . aspirin  324 mg Oral Once     Infusions:     Assessment/Plan   1. Chest Pain/NSTEMI Has H/O CAD -->Cath in 8/19 with moderate RCA and LCx disease, severe distal LAD disease -HS Trop 24 > 571---> will check HS Trop now and x2 as long as this remains flat will not pursue cath. \ or now will stop eliquis and start heparin drip.  -No EKG changes. Suspect in the setting of A fib RVR - Hold eliquis. Start heparin drip.   -Continue pravastatin and Praluent.    - Continue fenofibrate and vascepa.   2. A fib RVR  -Spontaneously converted to NSR after getting to ED - Hold elquis as noted above. Start heparin.   - Restart carvedilol 25 mg twice a day   3. A/C Diastolic Heart Failure TEE in 10/20 showed EF 60-65%, mild LVH, stable bioprosthetic aortic  valve s/p TAVR.PYP scan in 2/20 suggestive of transthyretin amyloidosis, genetic testing suggestive of wild type -CXR with pulmonary edema. Mild volume overload. Give 1 dose of 40 mg IV lasix.  - No torsemide today.  - Can restart torsemide 20 mg daily tomorrow.    4. CKD Stage IIIb Creatinine baseline 2.2-2.4 Todays creatinine 2.47 , stable.   5. Cardiac Amyloidosis PYP study abnormal, myeloma panel negative.  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 Will need to resume tafamidis once she goes home.   6.  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   7. 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    8. PAD: S/p PTCA to right popliteal and right AT in 7/20.  Now with left distal SFA-TP trunk bypass and left TMA in 2/21.   Admit to observation to HS Trop. If flat with no trend will d/c tomorrow.   Length of Stay: 0  Darrick Grinder, NP  12/09/2019, 10:06 AM  Advanced Heart Failure Team Pager [phone] (M-F; 7a - 4p)  Please contact [facility] for night-coverage after hours (4p -7a ) and weekends on [email or web address]   69 y/o woman with multiple medical issues including diastolic HF, CAD (with high grade diffuse mid to distal LAD disease on cath 8/19), CKD 3b, AS s/p TAVR and PAF.    Presents to ER for further evaluation of indigestion and CP. On arrival to ED was in AF with RVR with diffuse ST depression (no change from previous). AF broke spontaneously in ER. Hs trop initially 24 but climbed to 571 on recheck. BNP 162. Denies exertional CP.  General:  Elderly obese woman lying flat in bed No resp difficulty HEENT: normal Neck: supple. no JVD. Carotids 2+ bilat; no bruits. No lymphadenopathy or thryomegaly appreciated. Cor: PMI nondisplaced. Regular rate & rhythm. No rubs, gallops or murmurs. Lungs: clear Abdomen: obesesoft, nontender, nondistended. No  hepatosplenomegaly. No bruits or masses. Good bowel sounds. Extremities: no cyanosis, clubbing, rash, edema  Neuro: alert & orientedx3, cranial nerves grossly intact. moves all 4 extremities w/o difficulty. Affect pleasant  Suspect demand ischemia in setting of transient PAF with RVR. Now back in NSR and asymptomatic. I have reviewed her previous cath and she has multiple areas for potential rate-related ischemia including a diffusely disease mid to distal LAD.   Will hold Eliquis and start IV heparin. Trend troponin, Given CKD 3b and diffuse CAD would prefer to manage medically unless develops refractory symptoms. Continue carvedilol. Consider zio at d/c to assess AF burden and need for AA.   Afternoon torsemide also stopped recently. Can consider restarting afternoon torsemide every other day (keep morning torsemide daily).   Glori Bickers, MD  3:47 PM

## 2019-12-09 NOTE — Telephone Encounter (Signed)
Per Dr Haroldine Laws, pt in ER for afib, she is being d/c'd home and he would like her to get a 14 day Zio monitor. Order placed, company will mail her a monitor.

## 2019-12-09 NOTE — ED Triage Notes (Signed)
Pt states that she awoke the CP this a.m. @ 02:30. NAD noted.

## 2019-12-09 NOTE — Progress Notes (Signed)
Inpatient Diabetes Program Recommendations  AACE/ADA: New Consensus Statement on Inpatient Glycemic Control (2015)  Target Ranges:  Prepandial:   less than 140 mg/dL      Peak postprandial:   less than 180 mg/dL (1-2 hours)      Critically ill patients:  140 - 180 mg/dL   Lab Results  Component Value Date   GLUCAP 286 (H) 04/26/2019   HGBA1C 10.6 (H) 07/01/2019    Review of Glycemic Control Results for [name], [name] (MRN [id number]) as of 12/09/2019 15:14  Ref. Range 12/09/2019 05:50  Glucose Latest Ref Range: 70 - 99 mg/dL 312 (H)   Diabetes history: DM 2 Outpatient Diabetes medications: NPH 50 units qam, 50 units lunch, 25 units at bedtime Current orders for Inpatient glycemic control:  Novolog 0-15 units tid + hs  Inpatient Diabetes Program Recommendations:    Based on home regimen, renal function, and glucose 312 in labs this am consider the following:  - Levemir 18 units (0.2 units/kg) Q24 hours  - Reduce Novolog to "sensitive" 0-9 units tid + hs.  Thanks,  Tama Headings RN, MSN, BC-ADM Inpatient Diabetes Coordinator Team Pager [phone] (8a-5p)

## 2019-12-10 ENCOUNTER — Observation Stay (HOSPITAL_BASED_OUTPATIENT_CLINIC_OR_DEPARTMENT_OTHER): Payer: Medicare Other

## 2019-12-10 ENCOUNTER — Encounter (HOSPITAL_COMMUNITY): Payer: Self-pay | Admitting: Internal Medicine

## 2019-12-10 DIAGNOSIS — I5033 Acute on chronic diastolic (congestive) heart failure: Secondary | ICD-10-CM

## 2019-12-10 DIAGNOSIS — I248 Other forms of acute ischemic heart disease: Secondary | ICD-10-CM | POA: Diagnosis not present

## 2019-12-10 LAB — BASIC METABOLIC PANEL
Anion gap: 14 (ref 5–15)
BUN: 56 mg/dL — ABNORMAL HIGH (ref 8–23)
CO2: 25 mmol/L (ref 22–32)
Calcium: 9.1 mg/dL (ref 8.9–10.3)
Chloride: 103 mmol/L (ref 98–111)
Creatinine, Ser: 2.58 mg/dL — ABNORMAL HIGH (ref 0.44–1.00)
GFR calc Af Amer: 21 mL/min — ABNORMAL LOW (ref >60)
GFR calc non Af Amer: 18 mL/min — ABNORMAL LOW (ref >60)
Glucose, Bld: 263 mg/dL — ABNORMAL HIGH (ref 70–99)
Potassium: 3.9 mmol/L (ref 3.5–5.1)
Sodium: 142 mmol/L (ref 135–145)

## 2019-12-10 LAB — CBG MONITORING, ED: Glucose-Capillary: 246 mg/dL — ABNORMAL HIGH (ref 70–99)

## 2019-12-10 LAB — HEPARIN LEVEL (UNFRACTIONATED): Heparin Unfractionated: 1.1 IU/mL — ABNORMAL HIGH (ref 0.30–0.70)

## 2019-12-10 LAB — GLUCOSE, CAPILLARY: Glucose-Capillary: 265 mg/dL — ABNORMAL HIGH (ref 70–99)

## 2019-12-10 LAB — CBC
HCT: 36.6 % (ref 36.0–46.0)
Hemoglobin: 11.8 g/dL — ABNORMAL LOW (ref 12.0–15.0)
MCH: 26.4 pg (ref 26.0–34.0)
MCHC: 32.2 g/dL (ref 30.0–36.0)
MCV: 81.9 fL (ref 80.0–100.0)
Platelets: 221 10*3/uL (ref 150–400)
RBC: 4.47 MIL/uL (ref 3.87–5.11)
RDW: 15.9 % — ABNORMAL HIGH (ref 11.5–15.5)
WBC: 8.3 10*3/uL (ref 4.0–10.5)
nRBC: 0 % (ref 0.0–0.2)

## 2019-12-10 LAB — ECHOCARDIOGRAM COMPLETE
Height: 67 in
Weight: 3200 oz

## 2019-12-10 LAB — APTT: aPTT: 63 seconds — ABNORMAL HIGH (ref 24–36)

## 2019-12-10 MED ORDER — TORSEMIDE 20 MG PO TABS: ORAL_TABLET | ORAL | 5 refills | Status: DC

## 2019-12-10 MED ORDER — APIXABAN 5 MG PO TABS
5.0000 mg | ORAL_TABLET | Freq: Two times a day (BID) | ORAL | Status: DC
  Administered 2019-12-10: 5 mg via ORAL
  Filled 2019-12-10 (×2): qty 1

## 2019-12-10 NOTE — Care Management Obs Status (Signed)
[place] NOTIFICATION   Patient Details  Name: [name] MRN: [id number] Date of Birth: [birth year]   Medicare Observation Status Notification Given:  Yes    Carles Collet, RN 12/10/2019, 1:33 PM

## 2019-12-10 NOTE — Discharge Summary (Signed)
Advanced Heart Failure Team  Discharge Summary   Patient ID: [name] MRN: [id number], DOB/AGE: April 01, 1951 69 y.o. Admit date: 12/09/2019 D/C date:     12/10/2019   Primary Discharge Diagnoses:  1. Chest Pain/NSTEMI 2. A fib RVR  3. A/C Diastolic Heart Failure 4. CKD Stage IIIb 5. Cardiac Amyloidosis 6.S/p TAVR: Bioprosthetic aortic valve stable on 10/20 TEE, mild peri-valvular leakage.  7.Enterococcal endocarditis: Involving mitral valve, last TEE in 10/20 did not show residual disease. 8. PAD:S/p PTCA to right popliteal and right AT in 7/20.Now with left distal SFA-TP trunk bypass and left TMA in 2/21.  Hospital Course:  Ms Pixley is a 69 year old with history of AS s/p TAVR, chronic diastolic CHF, PAF, CKD III, HLD, HTN, OSA, TTR amyloidosis, PAD, and morbid obesity.  Presented [facility] with chest pain and increased SOB. Initially in A Fib RVR but spontaneously converted to NSR shortly after arriving to the ED. HS Trop elevated but this was felt to be demand ischemia with recurrent A fib. Diuresed with IV lasix and converted back torsemide on higher dose 20 mg twice one day then 20 mg once daily the next.   Zio Patch was sent to her home to assess for recurrent A fib. Plan to refer to A fib clinic for possible ablation. She will continue to be followed closely in the HF clinic. Plan to set up cardiomems at her follow up.   1. Chest Pain/NSTEMI Has H/O CAD -->Cath in 8/19 with moderate RCA and LCx disease, severe distal LAD disease -HS Trop 24 >571--->916->893 . Suspect demand ischemia with recurrent A fib.  - No longer having chest pain.  - Stop heparin drip and start eliquis.   -Continue pravastatin and Praluent.  - Continue fenofibrate and vascepa.  2. A fib RVR  -Spontaneously converted to NSR after getting to ED -Restart eliquis as noted above.  - Continue carvedilol 25 mg twice a day  - ZIo Patch mailed to his her home.  - Refer to A fib clinic for  possible ablation.   3. 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 - Volume status improved after dose of IV lasix. Restart torsemide today.  -Torsemide 20 mg twice a day then she will take 20 mg the next day.    4. CKD Stage IIIb Creatinine baseline 2.2-2.4 Todays creatinine 2.6, stable.    5. Cardiac Amyloidosis PYP study abnormal, myeloma panel negative. 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   6.S/p TAVR: Bioprosthetic aortic valve stable on 10/20 TEE, mild peri-valvular leakage.   7.Enterococcal endocarditis: Involving mitral valve, last TEE in 10/20 did not show residual disease.  8. PAD:S/p PTCA to right popliteal and right AT in 7/20.Now with left distal SFA-TP trunk bypass and left TMA in 2/21.  Discharge Vitals: Blood pressure 111/82, pulse 66, temperature 98.4 F (36.9 C), temperature source Oral, resp. rate 14, height _0  (1.702 m), weight 90.7 kg, SpO2 98 %.  Labs: Lab Results  Component Value Date   WBC 8.3 12/10/2019   HGB 11.8 (L) 12/10/2019   HCT 36.6 12/10/2019   MCV 81.9 12/10/2019   PLT 221 12/10/2019    Recent Labs  Lab 12/10/19 0943  NA 142  K 3.9  CL 103  CO2 25  BUN 56*  CREATININE 2.58*  CALCIUM 9.1  GLUCOSE 263*   Lab Results  Component Value Date   CHOL 107 10/08/2019   HDL 36 (L) 10/08/2019   LDLCALC 35 10/08/2019   TRIG 229 (H) 10/08/2019   BNP (last 3 results) Recent Labs    12/09/19 0624  BNP 161.9*    ProBNP (last 3 results) No results for input(s): PROBNP in the last 8760 hours.   Diagnostic Studies/Procedures   DG Chest Portable 1 View  Result Date: 12/09/2019 CLINICAL DATA:  Chest pain EXAM: PORTABLE CHEST 1 VIEW COMPARISON:   April 15, 2019 FINDINGS: There is unchanged cardiomegaly. The patient is status post TAVR. Aortic knob calcifications are seen. There is prominence of the central pulmonary vasculature. No large airspace consolidation or pleural effusion. No acute osseous abnormality. IMPRESSION: Cardiomegaly and pulmonary vascular congestion. Electronically Signed   By: Prudencio Pair M.D.   On: 12/09/2019 06:10   ECHOCARDIOGRAM COMPLETE  Result Date: 12/10/2019    ECHOCARDIOGRAM REPORT   Patient Name:   [name] Date of Exam: 12/10/2019 Medical Rec #:  [id number]        Height:       67.0 in Accession #:    [id number]       Weight:       200.0 lb Date of Birth:  December 05, 1950        BSA:          2.022 m Patient Age:    69 years         BP:           136/93 mmHg Patient Gender: F                HR:           58 bpm. Exam Location:  Inpatient Procedure: 2D Echo, Cardiac Doppler and Color Doppler Indications:    I50.9* Heart failure (unspecified)  History:        Patient has prior history of Echocardiogram examinations, most                 recent 06/05/2019. CAD, Abnormal ECG, Aortic Valve Disease,                 Endocarditis and Mitral Valve Disease,                 Signs/Symptoms:Bacteremia; Risk Factors:Hypertension,                 Dyslipidemia and Diabetes. S/P TAVR using 47m Edwards Sapien                 valve 2019.                 Aortic Valve: Edwards Sapien prosthetic, stented (TAVR) valve is                 present in the aortic position.  Sonographer:    TRoseanna RainbowRDCS Referring Phys: [id number] D Jadae Steinke  Sonographer Comments: Patient is morbidly obese. IMPRESSIONS  1. Normal LV systolic function; moderate LVH; grade 2 diastolic dsyfunction with elevated LV filling pressure; s/p AVR with normal mean gradient of 16 mmHg and no AI; AVA 1.6 cm2; MAC with mild MS (mean gradient 5 mmHg and MVA 2 cm2).  2. Left ventricular ejection fraction, by estimation, is 60 to 65%. The left ventricle has normal function. The left  ventricle has no regional wall motion abnormalities. There is moderate left ventricular  hypertrophy. Left ventricular diastolic parameters are consistent with Grade II diastolic dysfunction (pseudonormalization). Elevated left atrial pressure.  3. Right ventricular systolic function is normal. The right ventricular size is normal.  4. The mitral valve is abnormal. Trivial mitral valve regurgitation. Mild mitral stenosis.  5. The aortic valve has been repaired/replaced. Aortic valve regurgitation is not visualized. No aortic stenosis is present. There is a Transport planner prosthetic (TAVR) valve present in the aortic position.  6. The inferior vena cava is dilated in size with >50% respiratory variability, suggesting right atrial pressure of 8 mmHg. FINDINGS  Left Ventricle: Left ventricular ejection fraction, by estimation, is 60 to 65%. The left ventricle has normal function. The left ventricle has no regional wall motion abnormalities. The left ventricular internal cavity size was normal in size. There is  moderate left ventricular hypertrophy. Left ventricular diastolic parameters are consistent with Grade II diastolic dysfunction (pseudonormalization). Elevated left atrial pressure. Right Ventricle: The right ventricular size is normal. Right ventricular systolic function is normal. Left Atrium: Left atrial size was normal in size. Right Atrium: Right atrial size was normal in size. Pericardium: A small pericardial effusion is present. Mitral Valve: The mitral valve is abnormal. There is moderate calcification of the mitral valve leaflet(s). Normal mobility of the mitral valve leaflets. Moderate mitral annular calcification. Trivial mitral valve regurgitation. Mild mitral valve stenosis. MV peak gradient, 16.9 mmHg. The mean mitral valve gradient is 4.3 mmHg. Tricuspid Valve: The tricuspid valve is normal in structure. Tricuspid valve regurgitation is trivial. No evidence of tricuspid stenosis. Aortic Valve: The  aortic valve has been repaired/replaced. Aortic valve regurgitation is not visualized. No aortic stenosis is present. Aortic valve mean gradient measures 15.5 mmHg. Aortic valve peak gradient measures 28.8 mmHg. Aortic valve area, by VTI measures 1.57 cm. There is a Transport planner prosthetic, stented (TAVR) valve present in the aortic position. Pulmonic Valve: The pulmonic valve was normal in structure. Pulmonic valve regurgitation is trivial. No evidence of pulmonic stenosis. Aorta: The aortic root is normal in size and structure. Venous: The inferior vena cava is dilated in size with greater than 50% respiratory variability, suggesting right atrial pressure of 8 mmHg. IAS/Shunts: No atrial level shunt detected by color flow Doppler. Additional Comments: Normal LV systolic function; moderate LVH; grade 2 diastolic dsyfunction with elevated LV filling pressure; s/p AVR with normal mean gradient of 16 mmHg and no AI; AVA 1.6 cm2; MAC with mild MS (mean gradient 5 mmHg and MVA 2 cm2).  LEFT VENTRICLE PLAX 2D LVIDd:         4.10 cm     Diastology LVIDs:         2.40 cm     LV e' lateral:   3.08 cm/s LV PW:         1.50 cm     LV E/e' lateral: 60.4 LV IVS:        1.70 cm     LV e' medial:    3.00 cm/s LVOT diam:     1.80 cm     LV E/e' medial:  62.0 LV SV:         104 LV SV Index:   52 LVOT Area:     2.54 cm  LV Volumes (MOD) LV vol d, MOD A2C: 70.0 ml LV vol d, MOD A4C: 59.5 ml LV vol s, MOD A2C: 27.7 ml LV vol s, MOD A4C: 25.1 ml LV SV MOD A2C:     42.3 ml LV SV MOD  A4C:     59.5 ml LV SV MOD BP:      41.5 ml RIGHT VENTRICLE             IVC RV S prime:     11.40 cm/s  IVC diam: 2.30 cm TAPSE (M-mode): 1.6 cm LEFT ATRIUM             Index       RIGHT ATRIUM           Index LA diam:        4.00 cm 1.98 cm/m  RA Area:     13.00 cm LA Vol (A2C):   52.4 ml 25.91 ml/m RA Volume:   29.10 ml  14.39 ml/m LA Vol (A4C):   41.0 ml 20.27 ml/m LA Biplane Vol: 48.6 ml 24.03 ml/m  AORTIC VALVE                    PULMONIC  VALVE AV Area (Vmax):    1.84 cm     PR End Diast Vel: 1.55 msec AV Area (Vmean):   1.96 cm AV Area (VTI):     1.57 cm AV Vmax:           268.50 cm/s AV Vmean:          182.000 cm/s AV VTI:            0.665 m AV Peak Grad:      28.8 mmHg AV Mean Grad:      15.5 mmHg LVOT Vmax:         194.00 cm/s LVOT Vmean:        140.000 cm/s LVOT VTI:          0.410 m LVOT/AV VTI ratio: 0.62  AORTA Ao Root diam: 3.30 cm Ao Asc diam:  3.40 cm MITRAL VALVE MV Area (PHT): 3.60 cm     SHUNTS MV Peak grad:  16.9 mmHg    Systemic VTI:  0.41 m MV Mean grad:  4.3 mmHg     Systemic Diam: 1.80 cm MV Vmax:       2.06 m/s MV Vmean:      85.7 cm/s MV Decel Time: 211 msec MV E velocity: 186.00 cm/s MV A velocity: 112.00 cm/s MV E/A ratio:  1.66 Kirk Ruths MD Electronically signed by Kirk Ruths MD Signature Date/Time: 12/10/2019/1:43:18 PM    Final     Discharge Medications   Allergies as of 12/10/2019      Reactions   Amlodipine Swelling   Ankle swelling   Atorvastatin Other (See Comments)   Myalgias   Crestor [rosuvastatin Calcium] Other (See Comments)   Stiffness and back pain   Ezetimibe-simvastatin Other (See Comments)   Leg cramps   Sitagliptin Other (See Comments)   Statins Other (See Comments)   Myalgias   Celebrex [celecoxib] Rash   Levemir [insulin Detemir] Rash      Medication List    TAKE these medications   Accu-Chek Aviva Plus test strip Generic drug: glucose blood USE TO CHECK BLOOD SUGAR UP TO TWICE DAILY OR AS INSTRUCTED   acetaminophen 500 MG tablet Commonly known as: TYLENOL Take 1,000 mg by mouth every 6 (six) hours as needed for mild pain.   apixaban 5 MG Tabs tablet Commonly known as: Eliquis Take 1 tablet (5 mg total) by mouth 2 (two) times daily.   B-D INS SYR ULTRAFINE 1CC/31G 31G X 5/16" 1 ML Misc Generic drug: Insulin Syringe-Needle U-100 USE TO INJECT  INSULIN TWICE A DAY AS INSTRUCTED What changed: See the new instructions.   Calcium 600+D 600-400 MG-UNIT  tablet Generic drug: Calcium Carbonate-Vitamin D Take 1 tablet by mouth daily.   carvedilol 25 MG tablet Commonly known as: COREG Take 25 mg by mouth 2 (two) times daily with a meal.   fenofibrate 145 MG tablet Commonly known as: TRICOR Take 1 tablet (145 mg total) by mouth daily.   hydrALAZINE 10 MG tablet Commonly known as: APRESOLINE Take 10 mg by mouth 2 (two) times daily.   insulin NPH Human 100 UNIT/ML injection Commonly known as: NOVOLIN N Inject 25-50 Units into the skin See admin instructions. 50 units in the am 50 units @ lunch 25 units at bedtime sliding scale   Insulin Pen Needle 32G X 4 MM Misc Use to inject insulin with insulin pen   Livalo 4 MG Tabs Generic drug: Pitavastatin Calcium Take 4 mg by mouth every evening.   loratadine 10 MG tablet Commonly known as: CLARITIN Take 10 mg by mouth daily as needed for allergies.   MAGNESIUM PO Take by mouth daily as needed.   meclizine 12.5 MG tablet Commonly known as: ANTIVERT Take 1 tablet (12.5 mg total) by mouth 3 (three) times daily as needed for dizziness.   MULTIVITAMIN GUMMIES ADULT PO Take 2 tablets by mouth daily.   nutrition supplement (JUVEN) Pack Take 1 packet by mouth 2 (two) times daily between meals.   ondansetron 4 MG disintegrating tablet Commonly known as: Zofran ODT Take 1 tablet (4 mg total) by mouth every 8 (eight) hours as needed for nausea.   Praluent 75 MG/ML Soaj Generic drug: Alirocumab INJECT 75 MG INTO THE SKIN EVERY 14 (FOURTEEN) DAYS.   torsemide 20 MG tablet Commonly known as: DEMADEX Take 20 mg twice a day and alternate with 20 mg daily the next What changed:   how much to take  how to take this  when to take this  additional instructions   Vascepa 1 g capsule Generic drug: icosapent Ethyl Take 2 g by mouth 2 (two) times daily.   Vitamin D3 50 MCG (2000 UT) Tabs Take 2,000 Units by mouth daily.   Vyndamax 61 MG Caps Generic drug: Tafamidis Take 61 mg by  mouth daily.   ZINC PO Take by mouth daily as needed.       Disposition   The patient will be discharged in stable condition to home. Discharge Instructions    (HEART FAILURE PATIENTS) Call MD:  Anytime you have any of the following symptoms: 1) 3 pound weight gain in 24 hours or 5 pounds in 1 week 2) shortness of breath, with or without a dry hacking cough 3) swelling in the hands, feet or stomach 4) if you have to sleep on extra pillows at night in order to breathe.   Complete by: As directed    Diet - low sodium heart healthy   Complete by: As directed    Increase activity slowly   Complete by: As directed      Follow-up Information    [facility] Follow up on 12/25/2019.   Specialty: Cardiology Why: at 2:30 [place]  Contact information: [address] [id number] [place]       Sherran Needs, NP Follow up on 12/25/2019.   Specialties: Nurse Practitioner, Cardiology Why: at 2 pm. Garage Code Meta information: [place] [address]  [phone]             Duration of Discharge Encounter: Greater than 35 minutes   Signed, Shrey Boike NP-C  12/10/2019, 2:03 PM

## 2019-12-10 NOTE — Progress Notes (Signed)
Nsg Discharge Note  Admit Date:  12/09/2019 Discharge date: 12/10/2019   Madalaine P Parrillo to be D/C'd home per MD order.  AVS completed.  Copy for chart, and copy for patient signed, and dated. Patient/caregiver able to verbalize understanding.  Discharge Medication: Allergies as of 12/10/2019      Reactions   Amlodipine Swelling   Ankle swelling   Atorvastatin Other (See Comments)   Myalgias   Crestor [rosuvastatin Calcium] Other (See Comments)   Stiffness and back pain   Ezetimibe-simvastatin Other (See Comments)   Leg cramps   Sitagliptin Other (See Comments)   Statins Other (See Comments)   Myalgias   Celebrex [celecoxib] Rash   Levemir [insulin Detemir] Rash      Medication List    TAKE these medications   Accu-Chek Aviva Plus test strip Generic drug: glucose blood USE TO CHECK BLOOD SUGAR UP TO TWICE DAILY OR AS INSTRUCTED   acetaminophen 500 MG tablet Commonly known as: TYLENOL Take 1,000 mg by mouth every 6 (six) hours as needed for mild pain.   apixaban 5 MG Tabs tablet Commonly known as: Eliquis Take 1 tablet (5 mg total) by mouth 2 (two) times daily.   B-D INS SYR ULTRAFINE 1CC/31G 31G X 5/16" 1 ML Misc Generic drug: Insulin Syringe-Needle U-100 USE TO INJECT INSULIN TWICE A DAY AS INSTRUCTED What changed: See the new instructions.   Calcium 600+D 600-400 MG-UNIT tablet Generic drug: Calcium Carbonate-Vitamin D Take 1 tablet by mouth daily.   carvedilol 25 MG tablet Commonly known as: COREG Take 25 mg by mouth 2 (two) times daily with a meal.   fenofibrate 145 MG tablet Commonly known as: TRICOR Take 1 tablet (145 mg total) by mouth daily.   hydrALAZINE 10 MG tablet Commonly known as: APRESOLINE Take 10 mg by mouth 2 (two) times daily.   insulin NPH Human 100 UNIT/ML injection Commonly known as: NOVOLIN N Inject 25-50 Units into the skin See admin instructions. 50 units in the am 50 units @ lunch 25 units at bedtime sliding scale   Insulin Pen  Needle 32G X 4 MM Misc Use to inject insulin with insulin pen   Livalo 4 MG Tabs Generic drug: Pitavastatin Calcium Take 4 mg by mouth every evening.   loratadine 10 MG tablet Commonly known as: CLARITIN Take 10 mg by mouth daily as needed for allergies.   MAGNESIUM PO Take by mouth daily as needed.   meclizine 12.5 MG tablet Commonly known as: ANTIVERT Take 1 tablet (12.5 mg total) by mouth 3 (three) times daily as needed for dizziness.   MULTIVITAMIN GUMMIES ADULT PO Take 2 tablets by mouth daily.   nutrition supplement (JUVEN) Pack Take 1 packet by mouth 2 (two) times daily between meals.   ondansetron 4 MG disintegrating tablet Commonly known as: Zofran ODT Take 1 tablet (4 mg total) by mouth every 8 (eight) hours as needed for nausea.   Praluent 75 MG/ML Soaj Generic drug: Alirocumab INJECT 75 MG INTO THE SKIN EVERY 14 (FOURTEEN) DAYS.   torsemide 20 MG tablet Commonly known as: DEMADEX Take 20 mg twice a day and alternate with 20 mg daily the next What changed:   how much to take  how to take this  when to take this  additional instructions Notes to patient: Take 1 tablet in morning and 1 in evening    Vascepa 1 g capsule Generic drug: icosapent Ethyl Take 2 g by mouth 2 (two) times daily.   Vitamin  D3 50 MCG (2000 UT) Tabs Take 2,000 Units by mouth daily.   Vyndamax 61 MG Caps Generic drug: Tafamidis Take 61 mg by mouth daily.   ZINC PO Take by mouth daily as needed.       Discharge Assessment: Vitals:   12/10/19 0800 12/10/19 0952  BP: (!) 147/60 111/82  Pulse: 68 66  Resp: 18 14  Temp:  98.4 F (36.9 C)  SpO2: 95% 98%   Skin clean, dry and intact without evidence of skin break down, no evidence of skin tears noted. IV catheter discontinued intact. Site without signs and symptoms of complications - no redness or edema noted at insertion site, patient denies c/o pain - only slight tenderness at site.  Dressing with slight pressure  applied.  D/c Instructions-Education: Discharge instructions given to patient/family with verbalized understanding. D/c education completed with patient/family including follow up instructions, medication list, d/c activities limitations if indicated, with other d/c instructions as indicated by MD - patient able to verbalize understanding, all questions fully answered. Patient instructed to return to ED, call 911, or call MD for any changes in condition.  Patient escorted via [place], and D/C home via private auto.  Atilano Ina, RN 12/10/2019 3:51 PM

## 2019-12-10 NOTE — CV Procedure (Signed)
2D echo attempted but patient transferred to floor.

## 2019-12-10 NOTE — Progress Notes (Addendum)
Advanced Heart Failure Rounding Note  PCP-Cardiologist: Minus Breeding, MD   Subjective:     Yesterday diuresed with 40 mg IV lasix.   Feeling better. Denies SOB. Denies chest pain.    Objective:   Weight Range: 90.7 kg Body mass index is 31.32 kg/m.   Vital Signs:   Temp:  [98.4 F (36.9 C)-98.7 F (37.1 C)] 98.4 F (36.9 C) (04/27 0952) Pulse Rate:  [44-86] 66 (04/27 0952) Resp:  [8-21] 14 (04/27 0952) BP: (107-184)/(36-93) 111/82 (04/27 0952) SpO2:  [93 %-98 %] 98 % (04/27 0952) Weight:  [90.7 kg] 90.7 kg (04/26 1300)    Weight change: Filed Weights   12/09/19 1300  Weight: 90.7 kg    Intake/Output:  No intake or output data in the 24 hours ending 12/10/19 1119    Physical Exam    General: In  bed.  No resp difficulty HEENT: Normal Neck: Supple. JVP difficult to assess  . Carotids 2+ bilat; no bruits. No lymphadenopathy or thyromegaly appreciated. Cor: PMI nondisplaced. Regular rate & rhythm. No rubs, gallops or murmurs. Lungs: Clear Abdomen: Soft, nontender, nondistended. No hepatosplenomegaly. No bruits or masses. Good bowel sounds. Extremities: No cyanosis, clubbing, rash, edema. Left transmet amputation.  Neuro: Alert & orientedx3, cranial nerves grossly intact. moves all 4 extremities w/o difficulty. Affect pleasant   Telemetry   SR/SB 50-60s   EKG   n/a   Labs    CBC Recent Labs    12/09/19 0550 12/10/19 0747  WBC 8.6 8.3  HGB 12.0 11.8*  HCT 38.8 36.6  MCV 82.6 81.9  PLT 208 323   Basic Metabolic Panel Recent Labs    12/09/19 0550 12/10/19 0943  NA 141 142  K 4.0 3.9  CL 105 103  CO2 24 25  GLUCOSE 312* 263*  BUN 53* 56*  CREATININE 2.47* 2.58*  CALCIUM 9.0 9.1  MG 2.2  --    Liver Function Tests No results for input(s): AST, ALT, ALKPHOS, BILITOT, PROT, ALBUMIN in the last 72 hours. No results for input(s): LIPASE, AMYLASE in the last 72 hours. Cardiac Enzymes No results for input(s): CKTOTAL, CKMB, CKMBINDEX,  TROPONINI in the last 72 hours.  BNP: BNP (last 3 results) Recent Labs    12/09/19 0624  BNP 161.9*    ProBNP (last 3 results) No results for input(s): PROBNP in the last 8760 hours.   D-Dimer No results for input(s): DDIMER in the last 72 hours. Hemoglobin A1C No results for input(s): HGBA1C in the last 72 hours. Fasting Lipid Panel No results for input(s): CHOL, HDL, LDLCALC, TRIG, CHOLHDL, LDLDIRECT in the last 72 hours. Thyroid Function Tests No results for input(s): TSH, T4TOTAL, T3FREE, THYROIDAB in the last 72 hours.  Invalid input(s): FREET3  Other results:   Imaging     No results found.   Medications:     Scheduled Medications: . apixaban  5 mg Oral BID  . carvedilol  25 mg Oral BID WC  . fenofibrate  160 mg Oral Daily  . hydrALAZINE  10 mg Oral BID  . insulin aspart  0-15 Units Subcutaneous TID WC  . insulin aspart  0-5 Units Subcutaneous QHS  . pravastatin  80 mg Oral q1800  . sodium chloride flush  3 mL Intravenous Q12H  . torsemide  20 mg Oral Daily     Infusions: . sodium chloride       PRN Medications:  sodium chloride, acetaminophen, meclizine, ondansetron (ZOFRAN) IV, ondansetron, sodium chloride flush  Assessment/Plan   1. Chest Pain/NSTEMI Has H/O CAD -->Cath in 8/19 with moderate RCA and LCx disease, severe distal LAD disease -HS Trop 24 > 571--->916->893   will check HS Trop now and x2 as long as this remains flat will not pursue cath - No longer having chest pain.  - Stop heparin drip and start eliquis.  -Continue pravastatin and Praluent.  - Continue fenofibrate and vascepa.   2. A fib RVR  -Spontaneously converted to NSR after getting to ED - Restart eliquis as noted above.  - Continue carvedilol 25 mg twice a day  - ZIo Patch mailed to his her home.   3. 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 - VOlume status improved after dose of IV lasix. Restart torsemide today.  -Torsemide 20 mg twice a day then she will take 20 mg the next day.    4. CKD Stage IIIb Creatinine baseline 2.2-2.4 Todays creatinine 2.6, stable.    5. Cardiac Amyloidosis PYP study abnormal, myeloma panel negative. 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 Will need to resume tafamidis once she goes home.   6.  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   7. 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  8. PAD:S/p PTCA to right popliteal and right AT in 7/20.Now with left distal SFA-TP trunk bypass and left TMA in 2/21.    ECHO pending.   Length of Stay: 0  Darrick Grinder, NP  12/10/2019, 11:19 AM  Advanced Heart Failure Team Pager [phone] (M-F; [place])  Please contact [facility] for night-coverage after hours (4p -7a ) and weekends on [email or web address]  Patient seen with NP, agree with the above note.   She diuresed yesterday, breathing feels back to normal.  She remains in NSR.  No further chest pain once she went back into NSR.    I reviewed today's echo, EF 60-65% with moderate LVH, normal RV, s/p TAVR with mean gradient about 15 mmHg.   General: NAD Neck: Thick, no JVD, no thyromegaly or thyroid nodule.  Lungs: Clear to auscultation bilaterally with normal respiratory effort. CV: Nondisplaced PMI.  Heart regular S1/S2, no S3/S4, no murmur.  No peripheral edema.   Abdomen: Soft, nontender, no hepatosplenomegaly, no distention.  Skin: Intact without lesions or rashes.  Neurologic: Alert and oriented x 3.  Psych: Normal affect. Extremities: No clubbing or cyanosis.  HEENT: Normal.   She is doing better now that she is back in NSR. No further chest pain.  I think that  her chest pain and mild troponin increase were demand ischemia due to atrial fibrillation with RVR in setting of underlying stable moderate coronary disease.  Chest pain resolved back in NSR.  - Continue Coreg and apixaban.  - I will have her follow up in atrial fibrillation clinic, would like consideration for atrial fibrillation ablation.  Only anti-arrhythmic option would likely be amiodarone given CKD.   Volume status looks like it is near baseline.  Will send home on torsemide 20 bid alternating with 20 mg daily. Creatinine mildly above baseline.   - Will work up for [employer] as outpatient.   I think that she can go home today. Will arrange followup.   [name] 12/10/2019 1:47 PM

## 2019-12-10 NOTE — Progress Notes (Signed)
[place] for heparin  Indication: chest pain/ACS  Allergies  Allergen Reactions  . Amlodipine Swelling    Ankle swelling  . Atorvastatin Other (See Comments)    Myalgias   . Crestor [Rosuvastatin Calcium] Other (See Comments)    Stiffness and back pain  . Ezetimibe-Simvastatin Other (See Comments)    Leg cramps  . Sitagliptin Other (See Comments)  . Statins Other (See Comments)    Myalgias  . Celebrex [Celecoxib] Rash  . Levemir [Insulin Detemir] Rash    Patient Measurements: Height: 5\' 7"  (170.2 cm) Weight: 90.7 kg (200 lb) IBW/kg (Calculated) : 61.6 Heparin Dosing Weight: 81.1 kg   Vital Signs: Temp: 98.7 F (37.1 C) (04/26 1635) Temp Source: Oral (04/26 1635) BP: 184/66 (04/26 2325) Pulse Rate: 74 (04/26 2325)  Labs: Recent Labs    12/09/19 0550 12/09/19 0550 12/09/19 1130 12/09/19 1420 12/09/19 2219  HGB 12.0  --   --   --   --   HCT 38.8  --   --   --   --   PLT 208  --   --   --   --   APTT  --   --   --   --  38*  LABPROT 15.8*  --   --   --   --   INR 1.3*  --   --   --   --   HEPARINUNFRC  --   --   --   --  1.34*  CREATININE 2.47*  --   --   --   --   TROPONINIHS 24*   < > 571* 916* 893*   < > = values in this interval not displayed.    Estimated Creatinine Clearance: 25.2 mL/min (A) (by C-G formula based on SCr of 2.47 mg/dL (H)).   Assessment: 69 y.o. female with chest pain, Eliquis on hold, for heparin  Goal of Therapy:  Heparin level 0.3-0.7 units/ml aPTT 66-102 seconds Monitor platelets by anticoagulation protocol: Yes   Plan:  Increase Heparin 1300 units/hr Follow-up am labs.   Phillis Knack, PharmD, BCPS  12/10/2019 12:38 AM

## 2019-12-10 NOTE — Progress Notes (Signed)
[place] for heparin  Indication: chest pain/ACS  Allergies  Allergen Reactions  . Amlodipine Swelling    Ankle swelling  . Atorvastatin Other (See Comments)    Myalgias   . Crestor [Rosuvastatin Calcium] Other (See Comments)    Stiffness and back pain  . Ezetimibe-Simvastatin Other (See Comments)    Leg cramps  . Sitagliptin Other (See Comments)  . Statins Other (See Comments)    Myalgias  . Celebrex [Celecoxib] Rash  . Levemir [Insulin Detemir] Rash    Patient Measurements: Height: 5\' 7"  (170.2 cm) Weight: 90.7 kg (200 lb) IBW/kg (Calculated) : 61.6 Heparin Dosing Weight: 81.1 kg   Vital Signs: BP: 147/60 (04/27 0800) Pulse Rate: 68 (04/27 0800)  Labs: Recent Labs    12/09/19 0550 12/09/19 0550 12/09/19 1130 12/09/19 1420 12/09/19 2219 12/10/19 0747  HGB 12.0  --   --   --   --  11.8*  HCT 38.8  --   --   --   --  36.6  PLT 208  --   --   --   --  221  APTT  --   --   --   --  38* 63*  LABPROT 15.8*  --   --   --   --   --   INR 1.3*  --   --   --   --   --   HEPARINUNFRC  --   --   --   --  1.34* 1.10*  CREATININE 2.47*  --   --   --   --   --   TROPONINIHS 24*   < > 571* 916* 893*  --    < > = values in this interval not displayed.    Estimated Creatinine Clearance: 25.2 mL/min (A) (by C-G formula based on SCr of 2.47 mg/dL (H)).   Medical History: Past Medical History:  Diagnosis Date  . Atrial fibrillation ([place])    a. maintaining sinus s/p DCCV, on Eliquis  . CAD (coronary artery disease)    a. Nonobstructive by cath 2006 - 25% LAD, 25-30% after 1st diag, distal 25% PDA, minor irreg LCx. b. Normal nuc 2011.  . CKD (chronic kidney disease), stage III   . Cystocele   . Difficult intubation 09-07-2012   big neck trouble with intubation parotid surgery"takes little med to sedate"  . Esophageal stricture   . GERD (gastroesophageal reflux disease)   . Hiatal hernia    a. 07/2013: HH with small stricture  holding up barium tablet (concides with patient's sx of food sticking).  . History of kidney stones   . Hyperlipidemia   . Hypertension   . IBS (irritable bowel syndrome)   . Memory difficulty 11/26/2015  . Morbid obesity ([place])    a. hx of gastric bypass (sleeve gastrectomy 2015)  . Osteoporosis   . 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  Marland Kitchen Pericardial effusion    a. Mod by echo 2012 at time of PNA. b. Echo 07/2012: small pericardial effusion vs fat.  . Pressure ulcer 04/16/2019   right foot  . S/P TAVR (transcatheter aortic valve replacement)   . Severe aortic stenosis   . Sleep apnea     "have mask; don't use it" (02/14/2018)  . TIA (transient ischemic attack) 10/2014; 06/2016   "some memory issues since; daughter says my speech is sometimes different" (02/14/2018)  . Type II diabetes mellitus ([place])  Medications:  Scheduled:  . aspirin  324 mg Oral Once  . carvedilol  25 mg Oral BID WC  . fenofibrate  160 mg Oral Daily  . hydrALAZINE  10 mg Oral BID  . insulin aspart  0-15 Units Subcutaneous TID WC  . insulin aspart  0-5 Units Subcutaneous QHS  . pravastatin  80 mg Oral q1800  . sodium chloride flush  3 mL Intravenous Q12H  . torsemide  20 mg Oral Daily    Assessment: 72 yof presented with chest pain, converted from Afib in ED. On apixaban PTA - LD 4/25.   Heparin level came back falsely elevated at 1.1, aPTT slightly subtherapeutic at 63, on 1300 units/hr. Hgb 11.8, plt 221. Troponin peaked at 916, now downtrending. No s/sx of bleeding.   Will switch back to apixaban this morning. Okay for 5 mg dose - age<80, wt>60 kg, but Scr> 1.5.   Goal of Therapy:  Heparin level 0.3-0.7 units/ml aPTT 66-102 seconds Monitor platelets by anticoagulation protocol: Yes   Plan:  Stop heparin infusion Start apixaban 5 mg twice daily   Antonietta Jewel, PharmD, [place] Clinical Pharmacist  Phone: [phone] 12/10/2019 9:37 AM  Please check AMION for all [place]  phone numbers After 10:00 PM, call [place] [phone]

## 2019-12-10 NOTE — Progress Notes (Signed)
Inpatient Diabetes Program Recommendations  AACE/ADA: New Consensus Statement on Inpatient Glycemic Control (2015)  Target Ranges:  Prepandial:   less than 140 mg/dL      Peak postprandial:   less than 180 mg/dL (1-2 hours)      Critically ill patients:  140 - 180 mg/dL   Lab Results  Component Value Date   GLUCAP 246 (H) 12/10/2019   HGBA1C 10.6 (H) 07/01/2019    Review of Glycemic Control  Diabetes history: DM 2 Outpatient Diabetes medications: NPH 50 units qam, 50 units lunch, 25 units at bedtime Current orders for Inpatient glycemic control:  Novolog 0-15 units tid + hs  Inpatient Diabetes Program Recommendations:   Patients fasting CBG this am 246  - Levemir 18 units (0.2 units/kg) Q24 hours  Thank you, Bethena Roys E. Thaxton Pelley, RN, MSN, CDE  Diabetes Coordinator Inpatient Glycemic Control Team Team Pager [phone] (8am-5pm) 12/10/2019 11:21 AM

## 2019-12-10 NOTE — TOC Transition Note (Signed)
Transition of Care [facility]) - CM/SW Discharge Note   Patient Details  Name: AVNOOR KOURY MRN: [id number] Date of Birth: 1951-03-29  Transition of Care [facility]) CM/SW Contact:  Zenon Mayo, RN Phone Number: 12/10/2019, 2:03 PM   Clinical Narrative:    NCM spoke with patient, she states she is active with [facility] for HHPT, [place] contacted [facility] with [facility] , lvm  That patient is for dc today.  She has a walker and a w/chair at home,  She is observation so she does not need a HHPT order.   Final next level of care: [place] Barriers to Discharge: No Barriers Identified   Patient Goals and CMS Choice Patient states their goals for this hospitalization and ongoing recovery are:: get better      Discharge Placement                       Discharge Plan and Services                          HH Arranged: PT [facility] Agency: [place] Date [place]: 12/10/19 Time [place]: 1403 Representative spoke with at [place]: [place] ([employer]) Interventions     Readmission Risk Interventions Readmission Risk Prevention Plan 04/25/2019 05/10/2018  Transportation Screening Complete Complete  PCP or Specialist Appt within 5-7 Days - Complete  PCP or Specialist Appt within 3-5 Days Patient refused -  Home Care Screening - Complete  Medication Review (RN CM) - Complete  HRI or Home Care Consult Complete -  Social Work Consult for [place] Planning/Counseling Complete -  Palliative Care Screening Not Applicable -  Medication Review Press photographer) Complete -  Some recent data might be hidden

## 2019-12-10 NOTE — Progress Notes (Signed)
  Echocardiogram 2D Echocardiogram has been performed.  [name] 12/10/2019, 10:50 AM

## 2019-12-11 ENCOUNTER — Telehealth: Payer: Self-pay | Admitting: *Deleted

## 2019-12-11 NOTE — Telephone Encounter (Signed)
TRANSITIONAL CARE MANAGEMENT TELEPHONE OUTREACH NOTE   Contact Date: 12/11/2019 Contacted By: Alison Cove, LPN    DISCHARGE INFORMATION Date of Discharge:12/10/19 Discharge Facility: [facility] Discharge Diagnosis:A-Fib   Outpatient Follow Up Recommendations (copied from discharge summary) Follow-up Information    [facility] Follow up on 12/25/2019.   Specialty: Cardiology Why: at 2:30 [place]  Contact information: [address] [id number] [place]       Alison Needs, NP Follow up on 12/25/2019.   Specialties: Nurse Practitioner, Cardiology Why: at 2 pm. Garage Code 5008  Contact information: [place] [address] [phone]   These appts were made and will follow up up PCP as well  [name] is a female primary care patient of Alison Norlander, DO. An outgoing telephone call was made today and I spoke with [name], her daughter.  Ms. [place] condition(s) and treatment(s) were discussed. An opportunity to ask questions was provided and all were answered or forwarded as appropriate.    ACTIVITIES OF DAILY LIVING  [name] lives with their spouse and she can perform ADLs independently. her primary caregiver is herself and her husband. she is able to depend on her primary caregiver(s) for consistent help. Transportation to appointments, to pick up medications, and to run errands is not a problem.  (Consider referral to [place] if transportation or a consistent caregiver is a problem)   Fall Risk Fall Risk  10/23/2019 07/01/2019  Falls in the past year? 0 1  Number falls in past yr: - 0  Injury with Fall? - 0  Comment - -  Risk for fall due to : - -  Follow up - -    medium [name] Modifications/Assistive Devices Wheelchair: Yes Cane: No Ramp: Yes Bedside Toilet: No Hospital Bed:  No Other:    [place] she is receiving home health Physical therapy services.  [name] is the company in use for PT only.   MEDICATION RECONCILIATION  [name] has been able to pick-up all prescribed discharge medications from the pharmacy.   A post discharge medication reconciliation was performed and the complete medication list was reviewed with the patient/caregiver and is current as of 12/11/2019. Changes highlighted below.  Discontinued Medications none  Current Medication List Allergies as of 12/11/2019      Reactions   Amlodipine Swelling   Ankle swelling   Atorvastatin Other (See Comments)   Myalgias   Crestor [rosuvastatin Calcium] Other (See Comments)   Stiffness and back pain   Ezetimibe-simvastatin Other (See Comments)   Leg cramps   Sitagliptin Other (See Comments)   Statins Other (See Comments)   Myalgias   Celebrex [celecoxib] Rash   Levemir [insulin Detemir] Rash      Medication List       Accurate as of December 11, 2019  9:07 AM. If you have any questions, ask your nurse or doctor.        Accu-Chek Aviva Plus test strip Generic drug: glucose blood USE TO CHECK BLOOD SUGAR UP TO TWICE DAILY OR AS INSTRUCTED   acetaminophen 500 MG tablet Commonly known as: TYLENOL Take 1,000 mg by mouth every 6 (six) hours as needed for mild pain.   apixaban 5 MG Tabs tablet Commonly known as: Eliquis Take 1 tablet (5 mg total) by mouth 2 (two) times daily.   B-D INS  SYR ULTRAFINE 1CC/31G 31G X 5/16" 1 ML Misc Generic drug: Insulin Syringe-Needle U-100 USE TO INJECT INSULIN TWICE A DAY AS INSTRUCTED What changed: See the new instructions.   Calcium 600+D 600-400 MG-UNIT tablet Generic drug: Calcium Carbonate-Vitamin D Take 1 tablet by mouth daily.   carvedilol 25 MG tablet Commonly known as: COREG Take 25 mg by mouth 2 (two) times daily with a meal.   fenofibrate 145 MG tablet Commonly known as: TRICOR Take 1 tablet (145 mg total) by mouth daily.   hydrALAZINE  10 MG tablet Commonly known as: APRESOLINE Take 10 mg by mouth 2 (two) times daily.   insulin NPH Human 100 UNIT/ML injection Commonly known as: NOVOLIN N Inject 25-50 Units into the skin See admin instructions. 50 units in the am 50 units @ lunch 25 units at bedtime sliding scale   Insulin Pen Needle 32G X 4 MM Misc Use to inject insulin with insulin pen   Livalo 4 MG Tabs Generic drug: Pitavastatin Calcium Take 4 mg by mouth every evening.   loratadine 10 MG tablet Commonly known as: CLARITIN Take 10 mg by mouth daily as needed for allergies.   MAGNESIUM PO Take by mouth daily as needed.   meclizine 12.5 MG tablet Commonly known as: ANTIVERT Take 1 tablet (12.5 mg total) by mouth 3 (three) times daily as needed for dizziness.   MULTIVITAMIN GUMMIES ADULT PO Take 2 tablets by mouth daily.   nutrition supplement (JUVEN) Pack Take 1 packet by mouth 2 (two) times daily between meals.   ondansetron 4 MG disintegrating tablet Commonly known as: Zofran ODT Take 1 tablet (4 mg total) by mouth every 8 (eight) hours as needed for nausea.   Praluent 75 MG/ML [place] Generic drug: Alirocumab INJECT 75 MG INTO THE SKIN EVERY 14 (FOURTEEN) DAYS.   torsemide 20 MG tablet Commonly known as: DEMADEX Take 20 mg twice a day and alternate with 20 mg daily the next   Vascepa 1 g capsule Generic drug: icosapent Ethyl Take 2 g by mouth 2 (two) times daily.   Vitamin D3 50 MCG (2000 UT) Tabs Take 2,000 Units by mouth daily.   Vyndamax 61 MG Caps Generic drug: Tafamidis Take 61 mg by mouth daily.   ZINC PO Take by mouth daily as needed.        PATIENT EDUCATION & FOLLOW-UP PLAN  An appointment for Transitional Care Management is scheduled with Alison Norlander, DO on 12/20/19 at 1100am.  Take all medications as prescribed  Contact our office by calling [phone] if you have any questions or concerns

## 2019-12-12 DIAGNOSIS — I70209 Unspecified atherosclerosis of native arteries of extremities, unspecified extremity: Secondary | ICD-10-CM | POA: Diagnosis not present

## 2019-12-12 DIAGNOSIS — M86172 Other acute osteomyelitis, left ankle and foot: Secondary | ICD-10-CM | POA: Diagnosis not present

## 2019-12-12 DIAGNOSIS — E1151 Type 2 diabetes mellitus with diabetic peripheral angiopathy without gangrene: Secondary | ICD-10-CM | POA: Diagnosis not present

## 2019-12-12 DIAGNOSIS — Z4781 Encounter for orthopedic aftercare following surgical amputation: Secondary | ICD-10-CM | POA: Diagnosis not present

## 2019-12-12 DIAGNOSIS — I130 Hypertensive heart and chronic kidney disease with heart failure and stage 1 through stage 4 chronic kidney disease, or unspecified chronic kidney disease: Secondary | ICD-10-CM | POA: Diagnosis not present

## 2019-12-12 DIAGNOSIS — E1169 Type 2 diabetes mellitus with other specified complication: Secondary | ICD-10-CM | POA: Diagnosis not present

## 2019-12-14 ENCOUNTER — Other Ambulatory Visit (HOSPITAL_COMMUNITY): Payer: Self-pay | Admitting: Cardiology

## 2019-12-17 DIAGNOSIS — M86172 Other acute osteomyelitis, left ankle and foot: Secondary | ICD-10-CM | POA: Diagnosis not present

## 2019-12-17 DIAGNOSIS — I130 Hypertensive heart and chronic kidney disease with heart failure and stage 1 through stage 4 chronic kidney disease, or unspecified chronic kidney disease: Secondary | ICD-10-CM | POA: Diagnosis not present

## 2019-12-17 DIAGNOSIS — Z4781 Encounter for orthopedic aftercare following surgical amputation: Secondary | ICD-10-CM | POA: Diagnosis not present

## 2019-12-17 DIAGNOSIS — E1151 Type 2 diabetes mellitus with diabetic peripheral angiopathy without gangrene: Secondary | ICD-10-CM | POA: Diagnosis not present

## 2019-12-17 DIAGNOSIS — E1169 Type 2 diabetes mellitus with other specified complication: Secondary | ICD-10-CM | POA: Diagnosis not present

## 2019-12-17 DIAGNOSIS — I70209 Unspecified atherosclerosis of native arteries of extremities, unspecified extremity: Secondary | ICD-10-CM | POA: Diagnosis not present

## 2019-12-18 DIAGNOSIS — Z4781 Encounter for orthopedic aftercare following surgical amputation: Secondary | ICD-10-CM | POA: Diagnosis not present

## 2019-12-20 ENCOUNTER — Other Ambulatory Visit: Payer: Self-pay

## 2019-12-20 ENCOUNTER — Ambulatory Visit (INDEPENDENT_AMBULATORY_CARE_PROVIDER_SITE_OTHER): Payer: Medicare Other | Admitting: Family Medicine

## 2019-12-20 VITALS — BP 137/68 | HR 71 | Temp 97.6°F | Ht 67.0 in

## 2019-12-20 DIAGNOSIS — E1121 Type 2 diabetes mellitus with diabetic nephropathy: Secondary | ICD-10-CM

## 2019-12-20 DIAGNOSIS — Z09 Encounter for follow-up examination after completed treatment for conditions other than malignant neoplasm: Secondary | ICD-10-CM | POA: Diagnosis not present

## 2019-12-20 DIAGNOSIS — I5033 Acute on chronic diastolic (congestive) heart failure: Secondary | ICD-10-CM

## 2019-12-20 DIAGNOSIS — Z7901 Long term (current) use of anticoagulants: Secondary | ICD-10-CM

## 2019-12-20 DIAGNOSIS — I252 Old myocardial infarction: Secondary | ICD-10-CM

## 2019-12-20 DIAGNOSIS — Z794 Long term (current) use of insulin: Secondary | ICD-10-CM

## 2019-12-20 DIAGNOSIS — N1832 Chronic kidney disease, stage 3b: Secondary | ICD-10-CM

## 2019-12-20 DIAGNOSIS — I480 Paroxysmal atrial fibrillation: Secondary | ICD-10-CM

## 2019-12-20 DIAGNOSIS — E1122 Type 2 diabetes mellitus with diabetic chronic kidney disease: Secondary | ICD-10-CM

## 2019-12-20 LAB — BAYER DCA HB A1C WAIVED: HB A1C (BAYER DCA - WAIVED): 9.9 % — ABNORMAL HIGH (ref <7.0)

## 2019-12-20 NOTE — Progress Notes (Signed)
Subjective: CC: hospital follow up, TOC PCP: [name], DO NLG:[id number] P Neuner is a 69 y.o. female presenting to clinic today for:  1. NSTEMI, Afib w/ RVR, AoC HFpEF Patient recently discharged for A. fib with RVR, acute on chronic diastolic heart failure and NSTEMI.  She is currently wearing a Zio patch with plans to potentially go in and do an ablation.  She does report that heart rate has been under much better control recently and has been in the 70s.  She does still feel some intermittent fluttering in the chest but overall feels better than when she went into the hospital.  Denies any chest pain, shortness of breath.  Denies any depressive symptoms surrounding her medical issues.  She reports compliance with all of her medications.  She has follow-up with Dr. Chalmers Cater soon as well.  We'll perform foot exam today.  She does note the blood sugars have been running anywhere between 58 and 400s.  She will be getting custom diabetic shoes soon.  ROS: Per HPI  Allergies  Allergen Reactions  . Amlodipine Swelling    Ankle swelling  . Atorvastatin Other (See Comments)    Myalgias   . Crestor [Rosuvastatin Calcium] Other (See Comments)    Stiffness and back pain  . Ezetimibe-Simvastatin Other (See Comments)    Leg cramps  . Sitagliptin Other (See Comments)  . Statins Other (See Comments)    Myalgias  . Celebrex [Celecoxib] Rash  . Levemir [Insulin Detemir] Rash   Past Medical History:  Diagnosis Date  . Atrial fibrillation ([place])    a. maintaining sinus s/p DCCV, on Eliquis  . CAD (coronary artery disease)    a. Nonobstructive by cath 2006 - 25% LAD, 25-30% after 1st diag, distal 25% PDA, minor irreg LCx. b. Normal nuc 2011.  . CKD (chronic kidney disease), stage III   . Cystocele   . Difficult intubation 09-07-2012   big neck trouble with intubation parotid surgery"takes little med to sedate"  . Esophageal stricture   . GERD (gastroesophageal reflux disease)   .  Hiatal hernia    a. 07/2013: HH with small stricture holding up barium tablet (concides with patient's sx of food sticking).  . History of kidney stones   . Hyperlipidemia   . Hypertension   . IBS (irritable bowel syndrome)   . Memory difficulty 11/26/2015  . Morbid obesity ([place])    a. hx of gastric bypass (sleeve gastrectomy 2015)  . Osteoporosis   . 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  Marland Kitchen Pericardial effusion    a. Mod by echo 2012 at time of PNA. b. Echo 07/2012: small pericardial effusion vs fat.  . Pressure ulcer 04/16/2019   right foot  . S/P TAVR (transcatheter aortic valve replacement)   . Severe aortic stenosis   . Sleep apnea     "have mask; don't use it" (02/14/2018)  . TIA (transient ischemic attack) 10/2014; 06/2016   "some memory issues since; daughter says my speech is sometimes different" (02/14/2018)  . Type II diabetes mellitus ([place])     Current Outpatient Medications:  .  ACCU-CHEK AVIVA PLUS test strip, USE TO CHECK BLOOD SUGAR UP TO TWICE DAILY OR AS INSTRUCTED, Disp: 100 each, Rfl: 2 .  acetaminophen (TYLENOL) 500 MG tablet, Take 1,000 mg by mouth every 6 (six) hours as needed for mild pain., Disp: , Rfl:  .  apixaban (ELIQUIS) 5 MG TABS tablet, Take 1 tablet (5 mg  total) by mouth 2 (two) times daily., Disp: 180 tablet, Rfl: 1 .  B-D INS SYR ULTRAFINE 1CC/31G 31G X 5/16" 1 ML MISC, USE TO INJECT INSULIN TWICE A DAY AS INSTRUCTED (Patient taking differently: 1 Stick by Other route 3 (three) times daily before meals. ), Disp: 100 each, Rfl: 2 .  Calcium Carbonate-Vitamin D (CALCIUM 600+D) 600-400 MG-UNIT tablet, Take 1 tablet by mouth daily., Disp: , Rfl:  .  carvedilol (COREG) 25 MG tablet, Take 25 mg by mouth 2 (two) times daily with a meal., Disp: , Rfl:  .  Cholecalciferol (VITAMIN D3) 50 MCG (2000 UT) TABS, Take 2,000 Units by mouth daily., Disp: , Rfl:  .  fenofibrate (TRICOR) 145 MG tablet, TAKE 1 TABLET BY MOUTH EVERY DAY, Disp: 90 tablet,  Rfl: 3 .  hydrALAZINE (APRESOLINE) 10 MG tablet, Take 10 mg by mouth 2 (two) times daily. , Disp: , Rfl:  .  icosapent Ethyl (VASCEPA) 1 g capsule, Take 2 g by mouth 2 (two) times daily. , Disp: , Rfl:  .  insulin NPH Human (NOVOLIN N) 100 UNIT/ML injection, Inject 25-50 Units into the skin See admin instructions. 50 units in the am 50 units @ lunch 25 units at bedtime sliding scale, Disp: , Rfl:  .  Insulin Pen Needle 32G X 4 MM MISC, Use to inject insulin with insulin pen, Disp: 100 each, Rfl: 4 .  LIVALO 4 MG TABS, Take 4 mg by mouth every evening., Disp: , Rfl:  .  loratadine (CLARITIN) 10 MG tablet, Take 10 mg by mouth daily as needed for allergies., Disp: , Rfl:  .  MAGNESIUM PO, Take by mouth daily as needed., Disp: , Rfl:  .  meclizine (ANTIVERT) 12.5 MG tablet, Take 1 tablet (12.5 mg total) by mouth 3 (three) times daily as needed for dizziness., Disp: 30 tablet, Rfl: 5 .  Multiple Vitamins-Minerals (MULTIVITAMIN GUMMIES ADULT PO), Take 2 tablets by mouth daily., Disp: , Rfl:  .  Multiple Vitamins-Minerals (ZINC PO), Take by mouth daily as needed., Disp: , Rfl:  .  nutrition supplement, JUVEN, (JUVEN) PACK, Take 1 packet by mouth 2 (two) times daily between meals., Disp: 60 packet, Rfl: 0 .  ondansetron (ZOFRAN ODT) 4 MG disintegrating tablet, Take 1 tablet (4 mg total) by mouth every 8 (eight) hours as needed for nausea., Disp: 10 tablet, Rfl: 0 .  PRALUENT 75 MG/ML SOAJ, INJECT 75 MG INTO THE SKIN EVERY 14 (FOURTEEN) DAYS., Disp: 2 pen, Rfl: 6 .  Tafamidis (VYNDAMAX) 61 MG CAPS, Take 61 mg by mouth daily., Disp: 30 capsule, Rfl: 11 .  torsemide (DEMADEX) 20 MG tablet, Take 20 mg twice a day and alternate with 20 mg daily the next, Disp: 30 tablet, Rfl: 5 Social History   Socioeconomic History  . Marital status: Married    Spouse name: Not on file  . Number of children: 3  . Years of education: some college  . Highest education level: Some college, no degree  Occupational History    . Occupation: retired    Fish farm manager: [employer]  Tobacco Use  . Smoking status: Never Smoker  . Smokeless tobacco: Never Used  Substance and Sexual Activity  . Alcohol use: Yes    Alcohol/week: 0.0 standard drinks    Comment:  "mixed drink q few years"  . Drug use: Never  . Sexual activity: Yes    Birth control/protection: Post-menopausal  Other Topics Concern  . Not on file  Social History Narrative  Married   Patient is right handed.   Patient rarely drinks caffeine.   Lives at home with husband in two story home. Husband washes laundry in the basement. They have 3 daughters and 7 grandchildren.    Social Determinants of Health   Financial Resource Strain:   . Difficulty of Paying Living Expenses:   Food Insecurity:   . Worried About Charity fundraiser in the Last Year:   . Arboriculturist in the Last Year:   Transportation Needs:   . Film/video editor (Medical):   Marland Kitchen Lack of Transportation (Non-Medical):   Physical Activity:   . Days of Exercise per Week:   . Minutes of Exercise per Session:   Stress:   . Feeling of Stress :   Social Connections:   . Frequency of Communication with Friends and Family:   . Frequency of Social Gatherings with Friends and Family:   . Attends Religious Services:   . Active Member of Clubs or Organizations:   . Attends Archivist Meetings:   Marland Kitchen Marital Status:   Intimate Partner Violence:   . Fear of Current or Ex-Partner:   . Emotionally Abused:   Marland Kitchen Physically Abused:   . Sexually Abused:    Family History  Problem Relation Age of Onset  . Heart failure Mother   . Hypertension Mother   . Skin cancer Mother   . Colitis Mother   . Diabetes Mother   . Kidney Stones Mother   . Stroke Mother   . Heart disease Father   . Colon polyps Father   . Diabetes Father   . Heart failure Father   . Heart attack Sister   . Diabetes Other   . Stroke Maternal Grandfather   . Diabetes Maternal Grandfather   . Colon  cancer Neg Hx     Objective: Office vital signs reviewed. BP 137/68   Pulse 71   Temp 97.6 F (36.4 C) (Temporal)   Ht 5' 7"  (1.702 m)   SpO2 95%   BMI 31.32 kg/m   Physical Examination:  General: Awake, alert, chronically ill appearing female accompanied by spouse, No acute distress HEENT: Normal, sclera white. No JVD Cardio: regular rate and rhythm, S1S2 heard, no murmurs appreciated; ZIO patch in place Pulm: clear to auscultation bilaterally, no wheezes, rhonchi or rales; normal work of breathing on room air Extremities: left forefoot surgically absent. +1 pedal pulses bilaterally MSK: arrives in wheelchair Neuro: see DM foot  Diabetic Foot Exam - Simple   Simple Foot Form Diabetic Foot exam was performed with the following findings: Yes 12/20/2019 12:43 PM  Visual Inspection See comments: Yes Sensation Testing See comments: Yes Pulse Check See comments: Yes Comments Pedal pulses are palpable on the right but reduced.  She has surgically absent left forefoot.  There is a healed ulceration along the posterior right foot.  She has a few preulcerative calluses noted along the plantar surface of digits 3 and 4.  Monofilament sensation essentially absent.  She has absent vibratory sensation in the foot and ankle.     Assessment/ Plan: 69 y.o. female   1. Hospital discharge follow-up I reviewed the hospital discharge summary.  She has follow-up visit with cardiology soon.  Keep follow-up with endocrinology as well as her sugar was elevated today with A1c of 9.9  2. History of non-ST elevation myocardial infarction (NSTEMI) - CMP14+EGFR  3. Paroxysmal atrial fibrillation (HCC) Rate and seemingly rhythm controlled today  4. Chronic  anticoagulation - CBC  5. Acute on chronic diastolic CHF (congestive heart failure) (HCC) No evidence of fluid overload on today's exam  6. Type 2 diabetes mellitus with stage 3b chronic kidney disease, with long-term current use of insulin  ([place]) Uncontrolled with A1c of 9.9 today.  Diabetic foot exam performed today. - Bayer DCA Hb A1c Waived   Orders Placed This Encounter  Procedures  . CMP14+EGFR  . CBC  . Bayer DCA Hb A1c Waived   No orders of the defined types were placed in this encounter.   Today's visit is for Transitional Care Management.  The patient was discharged from [facility] on 12/10/2019 with a primary diagnosis of NSTEMI, AFib w/ RVR, acute on chronic diastolic heart failure.   Contact with the patient and/or caregiver, by a clinical staff member, was made on 12/10/2019 and was documented as a telephone encounter within the EMR.  Through chart review and discussion with the patient I have determined that management of their condition is of moderate complexity.    [name], DO [place] [phone]

## 2019-12-21 LAB — CMP14+EGFR
ALT: 15 IU/L (ref 0–32)
AST: 20 IU/L (ref 0–40)
Albumin/Globulin Ratio: 1.6 (ref 1.2–2.2)
Albumin: 3.8 g/dL (ref 3.8–4.8)
Alkaline Phosphatase: 49 IU/L (ref 39–117)
BUN/Creatinine Ratio: 25 (ref 12–28)
BUN: 67 mg/dL — ABNORMAL HIGH (ref 8–27)
Bilirubin Total: 0.3 mg/dL (ref 0.0–1.2)
CO2: 22 mmol/L (ref 20–29)
Calcium: 9.2 mg/dL (ref 8.7–10.3)
Chloride: 102 mmol/L (ref 96–106)
Creatinine, Ser: 2.69 mg/dL — ABNORMAL HIGH (ref 0.57–1.00)
GFR calc Af Amer: 20 mL/min/{1.73_m2} — ABNORMAL LOW (ref >59)
GFR calc non Af Amer: 18 mL/min/{1.73_m2} — ABNORMAL LOW (ref >59)
Globulin, Total: 2.4 g/dL (ref 1.5–4.5)
Glucose: 291 mg/dL — ABNORMAL HIGH (ref 65–99)
Potassium: 4 mmol/L (ref 3.5–5.2)
Sodium: 144 mmol/L (ref 134–144)
Total Protein: 6.2 g/dL (ref 6.0–8.5)

## 2019-12-21 LAB — CBC
Hematocrit: 38.8 % (ref 34.0–46.6)
Hemoglobin: 12.4 g/dL (ref 11.1–15.9)
MCH: 25.8 pg — ABNORMAL LOW (ref 26.6–33.0)
MCHC: 32 g/dL (ref 31.5–35.7)
MCV: 81 fL (ref 79–97)
Platelets: 219 10*3/uL (ref 150–450)
RBC: 4.8 x10E6/uL (ref 3.77–5.28)
RDW: 15.3 % (ref 11.7–15.4)
WBC: 8.1 10*3/uL (ref 3.4–10.8)

## 2019-12-23 ENCOUNTER — Other Ambulatory Visit: Payer: Self-pay

## 2019-12-23 ENCOUNTER — Encounter (HOSPITAL_COMMUNITY): Payer: Self-pay

## 2019-12-23 ENCOUNTER — Ambulatory Visit (HOSPITAL_COMMUNITY)
Admit: 2019-12-23 | Discharge: 2019-12-23 | Disposition: A | Discharge status: Home / Self Care | Payer: Medicare Other | Source: Ambulatory Visit | Attending: Cardiology | Admitting: Cardiology

## 2019-12-23 VITALS — BP 138/70 | HR 76 | Wt 202.0 lb

## 2019-12-23 DIAGNOSIS — I130 Hypertensive heart and chronic kidney disease with heart failure and stage 1 through stage 4 chronic kidney disease, or unspecified chronic kidney disease: Secondary | ICD-10-CM | POA: Insufficient documentation

## 2019-12-23 DIAGNOSIS — I2510 Atherosclerotic heart disease of native coronary artery without angina pectoris: Secondary | ICD-10-CM | POA: Insufficient documentation

## 2019-12-23 DIAGNOSIS — Z953 Presence of xenogenic heart valve: Secondary | ICD-10-CM | POA: Diagnosis not present

## 2019-12-23 DIAGNOSIS — G4733 Obstructive sleep apnea (adult) (pediatric): Secondary | ICD-10-CM | POA: Diagnosis not present

## 2019-12-23 DIAGNOSIS — E785 Hyperlipidemia, unspecified: Secondary | ICD-10-CM | POA: Diagnosis not present

## 2019-12-23 DIAGNOSIS — Z8249 Family history of ischemic heart disease and other diseases of the circulatory system: Secondary | ICD-10-CM | POA: Insufficient documentation

## 2019-12-23 DIAGNOSIS — Z7901 Long term (current) use of anticoagulants: Secondary | ICD-10-CM | POA: Insufficient documentation

## 2019-12-23 DIAGNOSIS — Z794 Long term (current) use of insulin: Secondary | ICD-10-CM | POA: Insufficient documentation

## 2019-12-23 DIAGNOSIS — Z8673 Personal history of transient ischemic attack (TIA), and cerebral infarction without residual deficits: Secondary | ICD-10-CM | POA: Diagnosis not present

## 2019-12-23 DIAGNOSIS — Z9884 Bariatric surgery status: Secondary | ICD-10-CM | POA: Diagnosis not present

## 2019-12-23 DIAGNOSIS — N1832 Chronic kidney disease, stage 3b: Secondary | ICD-10-CM | POA: Diagnosis not present

## 2019-12-23 DIAGNOSIS — I5032 Chronic diastolic (congestive) heart failure: Secondary | ICD-10-CM | POA: Insufficient documentation

## 2019-12-23 DIAGNOSIS — I480 Paroxysmal atrial fibrillation: Secondary | ICD-10-CM | POA: Insufficient documentation

## 2019-12-23 DIAGNOSIS — E1122 Type 2 diabetes mellitus with diabetic chronic kidney disease: Secondary | ICD-10-CM | POA: Insufficient documentation

## 2019-12-23 DIAGNOSIS — Z79899 Other long term (current) drug therapy: Secondary | ICD-10-CM | POA: Diagnosis not present

## 2019-12-23 NOTE — Progress Notes (Signed)
Advanced Heart Failure Clinic Note   Referring Physician: PCP: [name], DO PCP-Cardiologist: Minus Breeding, MD  [facility]: Dr. Aundra Dubin   HPI:  [name] is a 69 year old with history ofAS s/p TAVR, chronic diastolic CHF, PAF, CKD III, HLD, HTN, OSA,TTR amyloidosis, PAD,and morbid obesity.  Presented [facility] on 12/09/19 with chest pain and increased SOB. Initially in A Fib RVR but spontaneously converted to NSR shortly after arriving to the ED. HS Trop elevated but this was felt to be demand ischemia with recurrent A fib. Diuresed with IV lasix and converted back torsemide on higher dose 20 mg twice one day then 20 mg once daily the next.   Zio Patch was sent to her home to assess for recurrent A fib. She is still wearing this. Ordered for 2 weeks. She has been referred A fib clinic for possible ablation and has an appt later this week on 5/12. It was recommended that she also be considered for possible cardiomems implant for better monitoring of volume status.    She presents back to clinic today for f/u. Here with her husband. Doing well. Denies any further CP. No palpitations. Denies dyspnea. Compliant w/ medications. No wt gain. We discussed cardiomems. She is interested.    Review of Systems: [y] = yes, [ ]  = no   General: Weight gain [ ] ; Weight loss [ ] ; Anorexia [ ] ; Fatigue [ ] ; Fever [ ] ; Chills [ ] ; Weakness [ ]   Cardiac: Chest pain/pressure [ ] ; Resting SOB [ ] ; Exertional SOB [ ] ; Orthopnea [ ] ; Pedal Edema [ ] ; Palpitations [ ] ; Syncope [ ] ; Presyncope [ ] ; Paroxysmal nocturnal dyspnea[ ]   Pulmonary: Cough [ ] ; Wheezing[ ] ; Hemoptysis[ ] ; Sputum [ ] ; Snoring [ ]   GI: Vomiting[ ] ; Dysphagia[ ] ; Melena[ ] ; Hematochezia [ ] ; Heartburn[ ] ; Abdominal pain [ ] ; Constipation [ ] ; Diarrhea [ ] ; BRBPR [ ]   GU: Hematuria[ ] ; Dysuria [ ] ; Nocturia[ ]   Vascular: Pain in legs with walking [ ] ; Pain in feet with lying flat [ ] ; Non-healing sores [ ] ; Stroke [ ] ; TIA [ ] ; Slurred  speech [ ] ;  Neuro: Headaches[ ] ; Vertigo[ ] ; Seizures[ ] ; Paresthesias[ ] ;Blurred vision [ ] ; Diplopia [ ] ; Vision changes [ ]   Ortho/Skin: Arthritis [ ] ; Joint pain [ ] ; Muscle pain [ ] ; Joint swelling [ ] ; Back Pain [ ] ; Rash [ ]   Psych: Depression[ ] ; Anxiety[ ]   Heme: Bleeding problems [ ] ; Clotting disorders [ ] ; Anemia [ ]   Endocrine: Diabetes [ ] ; Thyroid dysfunction[ ]    Past Medical History:  Diagnosis Date  . Atrial fibrillation ([place])    a. maintaining sinus s/p DCCV, on Eliquis  . CAD (coronary artery disease)    a. Nonobstructive by cath 2006 - 25% LAD, 25-30% after 1st diag, distal 25% PDA, minor irreg LCx. b. Normal nuc 2011.  . CKD (chronic kidney disease), stage III   . Cystocele   . Difficult intubation 09-07-2012   big neck trouble with intubation parotid surgery"takes little med to sedate"  . Esophageal stricture   . GERD (gastroesophageal reflux disease)   . Hiatal hernia    a. 07/2013: HH with small stricture holding up barium tablet (concides with patient's sx of food sticking).  . History of kidney stones   . Hyperlipidemia   . Hypertension   . IBS (irritable bowel syndrome)   . Memory difficulty 11/26/2015  . Morbid obesity ([place])    a. hx of gastric  bypass (sleeve gastrectomy 2015)  . Osteoporosis   . 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  Marland Kitchen Pericardial effusion    a. Mod by echo 2012 at time of PNA. b. Echo 07/2012: small pericardial effusion vs fat.  . Pressure ulcer 04/16/2019   right foot  . S/P TAVR (transcatheter aortic valve replacement)   . Severe aortic stenosis   . Sleep apnea     "have mask; don't use it" (02/14/2018)  . TIA (transient ischemic attack) 10/2014; 06/2016   "some memory issues since; daughter says my speech is sometimes different" (02/14/2018)  . Type II diabetes mellitus ([place])     Current Outpatient Medications  Medication Sig Dispense Refill  . ACCU-CHEK AVIVA PLUS test strip USE TO CHECK BLOOD SUGAR UP TO  TWICE DAILY OR AS INSTRUCTED 100 each 2  . acetaminophen (TYLENOL) 500 MG tablet Take 1,000 mg by mouth every 6 (six) hours as needed for mild pain.    Marland Kitchen apixaban (ELIQUIS) 5 MG TABS tablet Take 1 tablet (5 mg total) by mouth 2 (two) times daily. 180 tablet 1  . B-D INS SYR ULTRAFINE 1CC/31G 31G X 5/16" 1 ML MISC USE TO INJECT INSULIN TWICE A DAY AS INSTRUCTED (Patient taking differently: 1 Stick by Other route 3 (three) times daily before meals. ) 100 each 2  . Calcium Carbonate-Vitamin D (CALCIUM 600+D) 600-400 MG-UNIT tablet Take 1 tablet by mouth daily.    . carvedilol (COREG) 25 MG tablet Take 25 mg by mouth 2 (two) times daily with a meal.    . Cholecalciferol (VITAMIN D3) 50 MCG (2000 UT) TABS Take 2,000 Units by mouth daily.    . fenofibrate (TRICOR) 145 MG tablet TAKE 1 TABLET BY MOUTH EVERY DAY 90 tablet 3  . hydrALAZINE (APRESOLINE) 10 MG tablet Take 10 mg by mouth 2 (two) times daily.     Marland Kitchen icosapent Ethyl (VASCEPA) 1 g capsule Take 2 g by mouth 2 (two) times daily.     . insulin NPH Human (NOVOLIN N) 100 UNIT/ML injection Inject 25-50 Units into the skin See admin instructions. 50 units in the am 50 units @ lunch 25 units at bedtime sliding scale    . Insulin Pen Needle 32G X 4 MM MISC Use to inject insulin with insulin pen 100 each 4  . LIVALO 4 MG TABS Take 4 mg by mouth every evening.    . loratadine (CLARITIN) 10 MG tablet Take 10 mg by mouth daily as needed for allergies.    Marland Kitchen MAGNESIUM PO Take by mouth daily as needed.    . meclizine (ANTIVERT) 12.5 MG tablet Take 1 tablet (12.5 mg total) by mouth 3 (three) times daily as needed for dizziness. 30 tablet 5  . Multiple Vitamins-Minerals (MULTIVITAMIN GUMMIES ADULT PO) Take 2 tablets by mouth daily.    . Multiple Vitamins-Minerals (ZINC PO) Take by mouth daily as needed.    . nutrition supplement, JUVEN, (JUVEN) PACK Take 1 packet by mouth 2 (two) times daily between meals. 60 packet 0  . ondansetron (ZOFRAN ODT) 4 MG  disintegrating tablet Take 1 tablet (4 mg total) by mouth every 8 (eight) hours as needed for nausea. 10 tablet 0  . PRALUENT 75 MG/ML SOAJ INJECT 75 MG INTO THE SKIN EVERY 14 (FOURTEEN) DAYS. 2 pen 6  . Tafamidis (VYNDAMAX) 61 MG CAPS Take 61 mg by mouth daily. 30 capsule 11  . torsemide (DEMADEX) 20 MG tablet Take 20 mg twice  a day and alternate with 20 mg daily the next 30 tablet 5   No current facility-administered medications for this encounter.    Allergies  Allergen Reactions  . Amlodipine Swelling    Ankle swelling  . Atorvastatin Other (See Comments)    Myalgias   . Crestor [Rosuvastatin Calcium] Other (See Comments)    Stiffness and back pain  . Ezetimibe-Simvastatin Other (See Comments)    Leg cramps  . Sitagliptin Other (See Comments)  . Statins Other (See Comments)    Myalgias  . Celebrex [Celecoxib] Rash  . Levemir [Insulin Detemir] Rash      Social History   Socioeconomic History  . Marital status: Married    Spouse name: Not on file  . Number of children: 3  . Years of education: some college  . Highest education level: Some college, no degree  Occupational History  . Occupation: retired    Fish farm manager: [employer]  Tobacco Use  . Smoking status: Never Smoker  . Smokeless tobacco: Never Used  Substance and Sexual Activity  . Alcohol use: Yes    Alcohol/week: 0.0 standard drinks    Comment:  "mixed drink q few years"  . Drug use: Never  . Sexual activity: Yes    Birth control/protection: Post-menopausal  Other Topics Concern  . Not on file  Social History Narrative   Married   Patient is right handed.   Patient rarely drinks caffeine.   Lives at home with husband in two story home. Husband washes laundry in the basement. They have 3 daughters and 7 grandchildren.    Social Determinants of Health   Financial Resource Strain:   . Difficulty of Paying Living Expenses:   Food Insecurity:   . Worried About Charity fundraiser in the Last Year:     . Arboriculturist in the Last Year:   Transportation Needs:   . Film/video editor (Medical):   Marland Kitchen Lack of Transportation (Non-Medical):   Physical Activity:   . Days of Exercise per Week:   . Minutes of Exercise per Session:   Stress:   . Feeling of Stress :   Social Connections:   . Frequency of Communication with Friends and Family:   . Frequency of Social Gatherings with Friends and Family:   . Attends Religious Services:   . Active Member of Clubs or Organizations:   . Attends Archivist Meetings:   Marland Kitchen Marital Status:   Intimate Partner Violence:   . Fear of Current or Ex-Partner:   . Emotionally Abused:   Marland Kitchen Physically Abused:   . Sexually Abused:       Family History  Problem Relation Age of Onset  . Heart failure Mother   . Hypertension Mother   . Skin cancer Mother   . Colitis Mother   . Diabetes Mother   . Kidney Stones Mother   . Stroke Mother   . Heart disease Father   . Colon polyps Father   . Diabetes Father   . Heart failure Father   . Heart attack Sister   . Diabetes Other   . Stroke Maternal Grandfather   . Diabetes Maternal Grandfather   . Colon cancer Neg Hx     Vitals:   12/23/19 1434  BP: 138/70  Pulse: 76  SpO2: 98%  Weight: 91.6 kg     PHYSICAL EXAM: General:  Well appearing, obese WF. No respiratory difficulty HEENT: normal Neck: supple. no JVD. Carotids 2+ bilat;  no bruits. No lymphadenopathy or thyromegaly appreciated. Cor: PMI nondisplaced. Regular rate & rhythm. No rubs, gallops or murmurs. Lungs: clear Abdomen:  obesesoft, nontender, nondistended. No hepatosplenomegaly. No bruits or masses. Good bowel sounds. Extremities: no cyanosis, clubbing, rash, edema Neuro: alert & oriented x 3, cranial nerves grossly intact. moves all 4 extremities w/o difficulty. Affect pleasant.   ASSESSMENT & PLAN:  1. CAD - Has H/O CAD -->Cath in 8/19 with moderate RCA and LCx disease, severe distal LAD disease - had recent admit  for CP in the setting of rapid afib. HS Trop 24--->571--->916--->893.  Suspected demand ischemia with recurrent A fib.  - she denies recurrent CP  - no ASA due to Eliquis  -Continue  blocker therapy w/ Coreg  -Continue pravastatin and Praluent.  -Continue fenofibrate and vascepa.  2. PAF - Continue w/ Zio patch to assess Afib burden  -Continuecarvedilol 25 mg twice a day - Continue Eliquis  - She has been referred to A fib clinic for possible ablation.   3. Chronic Diastolic Heart Failure - TEE in 10/20 showed EF 60-65%, mild LVH, stable bioprosthetic aortic valve s/p TAVR. - PYP scan in 2/20 suggestive of transthyretin amyloidosis, genetic testing suggestive of wild type. On tafamidis  - Volume status stable. Continue Torsemide  20 mg twice a day alternating with 20 mg the next day. - We discussed sliding scale torsemide based on daily wts. - Will being approval process for cardiomems.  4. CKD Stage IIIb - Creatinine baseline 2.2-2.7 - Had CMP 3 days ago. Scr stable at 2.6  5. Cardiac Amyloidosis - PYP study abnormal, myeloma panel negative. 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 - Continue tafamidis   6.S/p TAVR: Bioprosthetic aortic valve stable on 10/20 TEE, mild peri-valvular leakage.   7.Enterococcal endocarditis: Involving mitral valve, last TEE in 10/20 did not show residual disease.  8. PAD:S/p PTCA to right popliteal and right AT in 7/20.Now with left distal SFA-TP trunk bypass and left TMA in 2/21.  Dr. Aundra Dubin to f/u on results of Zio patch. We will start approval process for cardiomems. F/u w/ Dr. Aundra Dubin in 3 months.    Lyda Jester, PA-C 12/23/19

## 2019-12-23 NOTE — Patient Instructions (Signed)
NO changes today!  We will call you with results of zio patch monitor once that is finalized.  Keep your next appointment with Dr Aundra Dubin in August. Garage code is [id number]  Please call office at [phone] option 2 if you have any questions or concerns.   At the [facility], you and your health needs are our priority. As part of our continuing mission to provide you with exceptional heart care, we have created designated Provider Care Teams. These Care Teams include your primary Cardiologist (physician) and Advanced Practice Providers (APPs- Physician Assistants and Nurse Practitioners) who all work together to provide you with the care you need, when you need it.   You may see any of the following providers on your designated Care Team at your next follow up: Marland Kitchen Dr Glori Bickers . Dr [name] . Darrick Grinder, NP . Lyda Jester, PA . Audry Riles, PharmD   Please be sure to bring in all your medications bottles to every appointment.

## 2019-12-24 DIAGNOSIS — E7800 Pure hypercholesterolemia, unspecified: Secondary | ICD-10-CM | POA: Diagnosis not present

## 2019-12-24 DIAGNOSIS — N189 Chronic kidney disease, unspecified: Secondary | ICD-10-CM | POA: Diagnosis not present

## 2019-12-24 DIAGNOSIS — E669 Obesity, unspecified: Secondary | ICD-10-CM | POA: Diagnosis not present

## 2019-12-24 DIAGNOSIS — I10 Essential (primary) hypertension: Secondary | ICD-10-CM | POA: Diagnosis not present

## 2019-12-24 DIAGNOSIS — E1165 Type 2 diabetes mellitus with hyperglycemia: Secondary | ICD-10-CM | POA: Diagnosis not present

## 2019-12-24 DIAGNOSIS — R809 Proteinuria, unspecified: Secondary | ICD-10-CM | POA: Diagnosis not present

## 2019-12-25 ENCOUNTER — Other Ambulatory Visit: Payer: Self-pay

## 2019-12-25 ENCOUNTER — Encounter (HOSPITAL_COMMUNITY): Payer: Self-pay | Admitting: Nurse Practitioner

## 2019-12-25 ENCOUNTER — Ambulatory Visit (HOSPITAL_COMMUNITY)
Admission: RE | Admit: 2019-12-25 | Discharge: 2019-12-25 | Disposition: A | Discharge status: Home / Self Care | Payer: Medicare Other | Source: Ambulatory Visit | Attending: Nurse Practitioner | Admitting: Nurse Practitioner

## 2019-12-25 VITALS — BP 150/56 | HR 74 | Ht 67.0 in | Wt 201.2 lb

## 2019-12-25 DIAGNOSIS — E785 Hyperlipidemia, unspecified: Secondary | ICD-10-CM | POA: Insufficient documentation

## 2019-12-25 DIAGNOSIS — Z794 Long term (current) use of insulin: Secondary | ICD-10-CM | POA: Diagnosis not present

## 2019-12-25 DIAGNOSIS — N183 Chronic kidney disease, stage 3 unspecified: Secondary | ICD-10-CM | POA: Diagnosis not present

## 2019-12-25 DIAGNOSIS — Z888 Allergy status to other drugs, medicaments and biological substances status: Secondary | ICD-10-CM | POA: Insufficient documentation

## 2019-12-25 DIAGNOSIS — Z9884 Bariatric surgery status: Secondary | ICD-10-CM | POA: Insufficient documentation

## 2019-12-25 DIAGNOSIS — Z952 Presence of prosthetic heart valve: Secondary | ICD-10-CM | POA: Diagnosis not present

## 2019-12-25 DIAGNOSIS — I5032 Chronic diastolic (congestive) heart failure: Secondary | ICD-10-CM | POA: Diagnosis not present

## 2019-12-25 DIAGNOSIS — Z833 Family history of diabetes mellitus: Secondary | ICD-10-CM | POA: Insufficient documentation

## 2019-12-25 DIAGNOSIS — Z823 Family history of stroke: Secondary | ICD-10-CM | POA: Diagnosis not present

## 2019-12-25 DIAGNOSIS — I2510 Atherosclerotic heart disease of native coronary artery without angina pectoris: Secondary | ICD-10-CM | POA: Diagnosis not present

## 2019-12-25 DIAGNOSIS — Z7901 Long term (current) use of anticoagulants: Secondary | ICD-10-CM | POA: Diagnosis not present

## 2019-12-25 DIAGNOSIS — I130 Hypertensive heart and chronic kidney disease with heart failure and stage 1 through stage 4 chronic kidney disease, or unspecified chronic kidney disease: Secondary | ICD-10-CM | POA: Insufficient documentation

## 2019-12-25 DIAGNOSIS — Z8249 Family history of ischemic heart disease and other diseases of the circulatory system: Secondary | ICD-10-CM | POA: Diagnosis not present

## 2019-12-25 DIAGNOSIS — I480 Paroxysmal atrial fibrillation: Secondary | ICD-10-CM | POA: Diagnosis not present

## 2019-12-25 DIAGNOSIS — Z79899 Other long term (current) drug therapy: Secondary | ICD-10-CM | POA: Insufficient documentation

## 2019-12-25 DIAGNOSIS — Z8673 Personal history of transient ischemic attack (TIA), and cerebral infarction without residual deficits: Secondary | ICD-10-CM | POA: Diagnosis not present

## 2019-12-25 DIAGNOSIS — G4733 Obstructive sleep apnea (adult) (pediatric): Secondary | ICD-10-CM | POA: Insufficient documentation

## 2019-12-25 DIAGNOSIS — E1151 Type 2 diabetes mellitus with diabetic peripheral angiopathy without gangrene: Secondary | ICD-10-CM | POA: Insufficient documentation

## 2019-12-25 DIAGNOSIS — E1122 Type 2 diabetes mellitus with diabetic chronic kidney disease: Secondary | ICD-10-CM | POA: Insufficient documentation

## 2019-12-25 DIAGNOSIS — K219 Gastro-esophageal reflux disease without esophagitis: Secondary | ICD-10-CM | POA: Diagnosis not present

## 2019-12-25 NOTE — Progress Notes (Addendum)
Primary Care Physician: [name], DO Referring Physician: Dr. Ruthe Mannan [name] is a 69 y.o. female with a h/o AS s/p TAVR, chronic diastolic CHF, PAF, TIA.CKD III, HLD, HTN, OSA,TTR amyloidosis, PAD,endocarditis 04/2019, and morbid obesity.  Presented [facility] 4/26 with chest pain and increased SOB. Initially in A Fib RVR but spontaneously converted to NSR shortly after arriving to the ED. HS Trop elevated but this was felt to be demand ischemia with recurrent A fib. Diuresed with IV lasix and converted back to po torsemide.  Zio Patch was sent to her home to assess for recurrent A fib, which she is wearing until 5/14.  She is in the afib clinic to discuss possible ablation as suggested by Dr. Aundra Dubin with the last hospitalization. It was felt her only viable choice of antiarrythmic would be amiodarone, 2/2 CAD, HF and CKD.Marland Kitchen Marland KitchenHer echo reveals normal size left atrium.    She developed  afib 02/2018 and was on amiodarone until 09/2018 at which time Dr. Percival Spanish stopped it as she was maintaining SR.   She states that will feel some heart pounding when she lays down at night. She may feel an hour or two of pounding HR once or twice a month. This last episode that made her come to the ER made her feel "fainty."  She  also had s/p left distal SFA-TP trunk bypass graft with cryovein. This was done for non-healing wound of her left foot. She has since undergone a left TMA by Dr. Neomia Dear at [place] late winter 2021.   She states that she has had so much go on with her in the  recnt past, she would not mind putting any other procedures on the back burner. CHA2DS2VASc of at least 9, no issues with eliquis 5 mg bid.  Today, she denies symptoms of palpitations, chest pain, shortness of breath, orthopnea, PND, lower extremity edema, dizziness, presyncope, syncope, or neurologic sequela. The patient is tolerating medications without difficulties and is otherwise without complaint today.    Past Medical History:  Diagnosis Date  . Atrial fibrillation ([place])    a. maintaining sinus s/p DCCV, on Eliquis  . CAD (coronary artery disease)    a. Nonobstructive by cath 2006 - 25% LAD, 25-30% after 1st diag, distal 25% PDA, minor irreg LCx. b. Normal nuc 2011.  . CKD (chronic kidney disease), stage III   . Cystocele   . Difficult intubation 09-07-2012   big neck trouble with intubation parotid surgery"takes little med to sedate"  . Esophageal stricture   . GERD (gastroesophageal reflux disease)   . Hiatal hernia    a. 07/2013: HH with small stricture holding up barium tablet (concides with patient's sx of food sticking).  . History of kidney stones   . Hyperlipidemia   . Hypertension   . IBS (irritable bowel syndrome)   . Memory difficulty 11/26/2015  . Morbid obesity ([place])    a. hx of gastric bypass (sleeve gastrectomy 2015)  . Osteoporosis   . 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  Marland Kitchen Pericardial effusion    a. Mod by echo 2012 at time of PNA. b. Echo 07/2012: small pericardial effusion vs fat.  . Pressure ulcer 04/16/2019   right foot  . S/P TAVR (transcatheter aortic valve replacement)   . Severe aortic stenosis   . Sleep apnea     "have mask; don't use it" (02/14/2018)  . TIA (transient ischemic attack) 10/2014; 06/2016   "  some memory issues since; daughter says my speech is sometimes different" (02/14/2018)  . Type II diabetes mellitus ([place])    Past Surgical History:  Procedure Laterality Date  . BREATH TEK H PYLORI N/A 07/29/2013   Procedure: [place];  Surgeon: Shann Medal, MD;  Location: [facility];  Service: General;  Laterality: N/A;  . CARDIAC CATHETERIZATION  2006  . CARDIOVERSION N/A 02/16/2018   Procedure: CARDIOVERSION;  Surgeon: Pixie Casino, MD;  Location: [facility];  Service: Cardiovascular;  Laterality: N/A;  . CARPAL TUNNEL RELEASE Left 2011  . CATARACT EXTRACTION W/ INTRAOCULAR LENS  IMPLANT, BILATERAL  Bilateral 2003   left  . CHOLECYSTECTOMY OPEN  1982  . COLONOSCOPY N/A 07/14/2015   Procedure: COLONOSCOPY;  Surgeon: Ladene Artist, MD;  Location: [facility];  Service: Endoscopy;  Laterality: N/A;  . ESOPHAGOGASTRODUODENOSCOPY N/A 12/27/2013   Procedure: ESOPHAGOGASTRODUODENOSCOPY (EGD);  Surgeon: Shann Medal, MD;  Location: [facility];  Service: General;  Laterality: N/A;  . ESOPHAGOGASTRODUODENOSCOPY (EGD) WITH ESOPHAGEAL DILATION    . INTRAOPERATIVE TRANSTHORACIC ECHOCARDIOGRAM  05/08/2018   Procedure: INTRAOPERATIVE TRANSTHORACIC ECHOCARDIOGRAM;  Surgeon: Burnell Blanks, MD;  Location: [facility];  Service: Open Heart Surgery;;  . IR FLUORO GUIDE CV LINE RIGHT  04/25/2019  . IR REMOVAL TUN CV CATH W/O FL  06/20/2019  . IR US GUIDE VASC ACCESS RIGHT  04/25/2019  . LAPAROSCOPIC GASTRIC SLEEVE RESECTION N/A 01/20/2014   Procedure: LAPAROSCOPIC GASTRIC SLEEVE RESECTION;  Surgeon: Shann Medal, MD;  [facility]: [facility];  Service: General;  Laterality: N/A;  . PAROTIDECTOMY  09/07/2012   Procedure: PAROTIDECTOMY;  Surgeon: Melida Quitter, MD;  Location: [facility];  Service: ENT;  Laterality: Left;  LEFT PAROTIDECTOMY  . RIGHT HEART CATH N/A 09/25/2018   Procedure: RIGHT HEART CATH;  Surgeon: Larey Dresser, MD;  Location: [facility];  Service: Cardiovascular;  Laterality: N/A;  . RIGHT/LEFT HEART CATH AND CORONARY ANGIOGRAPHY N/A 03/29/2018   Procedure: RIGHT/LEFT HEART CATH AND CORONARY ANGIOGRAPHY;  Surgeon: Burnell Blanks, MD;  Location: [facility];  Service: Cardiovascular;  Laterality: N/A;  . TEE WITHOUT CARDIOVERSION N/A 02/16/2018   Procedure: TRANSESOPHAGEAL ECHOCARDIOGRAM (TEE);  Surgeon: Pixie Casino, MD;  Location: [facility];  Service: Cardiovascular;  Laterality: N/A;  . TEE WITHOUT CARDIOVERSION N/A 06/05/2019   Procedure: TRANSESOPHAGEAL ECHOCARDIOGRAM (TEE);  Surgeon: Sanda Klein, MD;  Location: [facility];  Service: Cardiovascular;   Laterality: N/A;  . TEE WITHOUT CARDIOVERSION N/A 04/24/2019   Procedure: TRANSESOPHAGEAL ECHOCARDIOGRAM (TEE);  Surgeon: Sanda Klein, MD;  Location: [facility];  Service: Cardiovascular;  Laterality: N/A;  . TOE AMPUTATION Left 07/2017   great toe; [facility]   . TONSILLECTOMY  1968  . TRANSCATHETER AORTIC VALVE REPLACEMENT, TRANSFEMORAL  05/08/2018   Transcatheter Aortic Valve Replacement   . TRANSCATHETER AORTIC VALVE REPLACEMENT, TRANSFEMORAL N/A 05/08/2018   Procedure: TRANSCATHETER AORTIC VALVE REPLACEMENT, TRANSFEMORAL;  Surgeon: Burnell Blanks, MD;  Location: [facility];  Service: Open Heart Surgery;  Laterality: N/A;  . TUBAL LIGATION  yrs ago  . UPPER GI ENDOSCOPY  01/20/2014   Procedure: UPPER GI ENDOSCOPY;  Surgeon: Shann Medal, MD;  [facility];  Service: General;;    Current Outpatient Medications  Medication Sig Dispense Refill  . ACCU-CHEK AVIVA PLUS test strip USE TO CHECK BLOOD SUGAR UP TO TWICE DAILY OR AS INSTRUCTED 100 each 2  . acetaminophen (TYLENOL) 500 MG tablet Take 1,000 mg by mouth every 6 (six)  hours as needed for mild pain.    Marland Kitchen apixaban (ELIQUIS) 5 MG TABS tablet Take 1 tablet (5 mg total) by mouth 2 (two) times daily. 180 tablet 1  . B-D INS SYR ULTRAFINE 1CC/31G 31G X 5/16" 1 ML MISC USE TO INJECT INSULIN TWICE A DAY AS INSTRUCTED (Patient taking differently: 1 Stick by Other route 3 (three) times daily before meals. ) 100 each 2  . Calcium Carbonate-Vitamin D (CALCIUM 600+D) 600-400 MG-UNIT tablet Take 1 tablet by mouth daily.    . carvedilol (COREG) 25 MG tablet Take 25 mg by mouth 2 (two) times daily with a meal.    . Cholecalciferol (VITAMIN D3) 50 MCG (2000 UT) TABS Take 2,000 Units by mouth daily.    . fenofibrate (TRICOR) 145 MG tablet TAKE 1 TABLET BY MOUTH EVERY DAY 90 tablet 3  . hydrALAZINE (APRESOLINE) 10 MG tablet Take 10 mg by mouth 2 (two) times daily.     Marland Kitchen icosapent Ethyl (VASCEPA) 1 g capsule Take 2 g by mouth 2 (two) times daily.      . insulin NPH Human (NOVOLIN N) 100 UNIT/ML injection Inject 25-50 Units into the skin See admin instructions. 50 units in the am 50 units @ lunch 25 units at bedtime sliding scale    . Insulin Pen Needle 32G X 4 MM MISC Use to inject insulin with insulin pen 100 each 4  . LIVALO 4 MG TABS Take 4 mg by mouth every evening.    . loratadine (CLARITIN) 10 MG tablet Take 10 mg by mouth daily as needed for allergies.    Marland Kitchen MAGNESIUM PO Take by mouth daily as needed.    . meclizine (ANTIVERT) 12.5 MG tablet Take 1 tablet (12.5 mg total) by mouth 3 (three) times daily as needed for dizziness. 30 tablet 5  . Multiple Vitamins-Minerals (MULTIVITAMIN GUMMIES ADULT PO) Take 2 tablets by mouth daily.    . Multiple Vitamins-Minerals (ZINC PO) Take by mouth daily as needed.    . nutrition supplement, JUVEN, (JUVEN) PACK Take 1 packet by mouth 2 (two) times daily between meals. 60 packet 0  . ondansetron (ZOFRAN ODT) 4 MG disintegrating tablet Take 1 tablet (4 mg total) by mouth every 8 (eight) hours as needed for nausea. 10 tablet 0  . PRALUENT 75 MG/ML SOAJ INJECT 75 MG INTO THE SKIN EVERY 14 (FOURTEEN) DAYS. 2 pen 6  . Tafamidis (VYNDAMAX) 61 MG CAPS Take 61 mg by mouth daily. 30 capsule 11  . torsemide (DEMADEX) 20 MG tablet Take 20 mg twice a day and alternate with 20 mg daily the next 30 tablet 5   No current facility-administered medications for this encounter.    Allergies  Allergen Reactions  . Amlodipine Swelling    Ankle swelling  . Atorvastatin Other (See Comments)    Myalgias   . Crestor [Rosuvastatin Calcium] Other (See Comments)    Stiffness and back pain  . Ezetimibe-Simvastatin Other (See Comments)    Leg cramps  . Sitagliptin Other (See Comments)  . Statins Other (See Comments)    Myalgias  . Celebrex [Celecoxib] Rash  . Levemir [Insulin Detemir] Rash    Social History   Socioeconomic History  . Marital status: Married    Spouse name: Not on file  . Number of children: 3   . Years of education: some college  . Highest education level: Some college, no degree  Occupational History  . Occupation: retired    Fish farm manager: [employer]  Use  . Smoking status: Never Smoker  . Smokeless tobacco: Never Used  Substance and Sexual Activity  . Alcohol use: Yes    Alcohol/week: 0.0 standard drinks    Comment:  "mixed drink q few years"  . Drug use: Never  . Sexual activity: Yes    Birth control/protection: Post-menopausal  Other Topics Concern  . Not on file  Social History Narrative   Married   Patient is right handed.   Patient rarely drinks caffeine.   Lives at home with husband in two story home. Husband washes laundry in the basement. They have 3 daughters and 7 grandchildren.    Social Determinants of Health   Financial Resource Strain:   . Difficulty of Paying Living Expenses:   Food Insecurity:   . Worried About Charity fundraiser in the Last Year:   . Arboriculturist in the Last Year:   Transportation Needs:   . Film/video editor (Medical):   Marland Kitchen Lack of Transportation (Non-Medical):   Physical Activity:   . Days of Exercise per Week:   . Minutes of Exercise per Session:   Stress:   . Feeling of Stress :   Social Connections:   . Frequency of Communication with Friends and Family:   . Frequency of Social Gatherings with Friends and Family:   . Attends Religious Services:   . Active Member of Clubs or Organizations:   . Attends Archivist Meetings:   Marland Kitchen Marital Status:   Intimate Partner Violence:   . Fear of Current or Ex-Partner:   . Emotionally Abused:   Marland Kitchen Physically Abused:   . Sexually Abused:     Family History  Problem Relation Age of Onset  . Heart failure Mother   . Hypertension Mother   . Skin cancer Mother   . Colitis Mother   . Diabetes Mother   . Kidney Stones Mother   . Stroke Mother   . Heart disease Father   . Colon polyps Father   . Diabetes Father   . Heart failure Father   . Heart  attack Sister   . Diabetes Other   . Stroke Maternal Grandfather   . Diabetes Maternal Grandfather   . Colon cancer Neg Hx     ROS- All systems are reviewed and negative except as per the HPI above  Physical Exam: There were no vitals filed for this visit. Wt Readings from Last 3 Encounters:  12/23/19 202 lb (91.6 kg)  12/09/19 200 lb (90.7 kg)  11/18/19 202 lb 3.2 oz (91.7 kg)    Labs: Lab Results  Component Value Date   NA 144 12/20/2019   K 4.0 12/20/2019   CL 102 12/20/2019   CO2 22 12/20/2019   GLUCOSE 291 (H) 12/20/2019   BUN 67 (H) 12/20/2019   CREATININE 2.69 (H) 12/20/2019   CALCIUM 9.2 12/20/2019   PHOS 4.2 04/23/2019   MG 2.2 12/09/2019   Lab Results  Component Value Date   INR 1.3 (H) 12/09/2019   Lab Results  Component Value Date   CHOL 107 10/08/2019   HDL 36 (L) 10/08/2019   LDLCALC 35 10/08/2019   TRIG 229 (H) 10/08/2019     GEN- The patient is well appearing, alert and oriented x 3 today.   Head- normocephalic, atraumatic Eyes-  Sclera clear, conjunctiva pink Ears- hearing intact Oropharynx- clear Neck- supple, no JVP Lymph- no cervical lymphadenopathy Lungs- Clear to ausculation bilaterally, normal work of breathing Heart- Regular  rate and rhythm, no murmurs, rubs or gallops, PMI not laterally displaced GI- soft, NT, ND, + BS Extremities- no clubbing, cyanosis, or edema, wearing orthotics on feet, all toes on left foot removed  MS- no significant deformity or atrophy Skin- no rash or lesion Psych- euthymic mood, full affect Neuro- strength and sensation are intact  EKG- Sinus rhythm at 74 bpm, with blocked PAC's pr int 154 ms, qrs int 88 ms, qtc 459 ms  Echo-1. Normal LV systolic function; moderate LVH; grade 2 diastolic dsyfunction with elevated LV filling pressure; s/p AVR with normal mean gradient of 16 mmHg and no AI; AVA 1.6 cm2; MAC with mild MS (mean gradient 5 mmHg and MVA 2 cm2). 2. Left ventricular ejection fraction, by  estimation, is 60 to 65%. The left ventricle has normal function. The left ventricle has no regional wall motion abnormalities. There is moderate left ventricular hypertrophy. Left ventricular diastolic parameters are consistent with Grade II diastolic dysfunction (pseudonormalization). Elevated left atrial pressure. 3. Right ventricular systolic function is normal. The right ventricular size is normal. 4. The mitral valve is abnormal. Trivial mitral valve regurgitation. Mild mitral stenosis. 5. The aortic valve has been repaired/replaced. Aortic valve regurgitation is not visualized. No aortic stenosis is present. There is a Transport planner prosthetic (TAVR) valve present in the aortic position. 6. The inferior vena cava is dilated in size with >50% respiratory variability, suggesting right atrial pressure of 8 mmHg. Left Atrium: Left atrial size was normal in size.   Assessment and Plan: 1. Paroxysmal  afib Has h/o of same since 02/2018 Overall has been quiet  Was on amiodarone 02/2018 and stopped 09/2018 as she was maintaining  SR Was symptomatic with  chest pain and lightheadedness with last hospitalization 2/2021presenting with afib with RVR, self converted shortly on arrival to ER Dr. Aundra Dubin wanted pt considered for an ablation vrs putting her back on amiodarone  Pt would rather wait as she has had so many  procedures/hospitizations  over the last year, but she would like to discuss further with Dr. Rayann Heman   2. CHA2DS2VASc score of at least  9 Continue  eliquis 5 mg bid   Will forward my note to Dr. Rayann Heman and after review he feels she would be a good candidate for ablation, will arrange a televisit.  Geroge Baseman Adan Beal, [facility] [address] [place], [place] [address] [phone]

## 2019-12-26 ENCOUNTER — Telehealth: Payer: Self-pay | Admitting: Family Medicine

## 2019-12-26 NOTE — Telephone Encounter (Signed)
CALL COMPLETED, RESULTS GIVEN

## 2020-01-06 MED FILL — VYNDAMAX 61 MG CAPS: 61 | 30 days supply | Qty: 30 | Fill #4

## 2020-01-08 DIAGNOSIS — N184 Chronic kidney disease, stage 4 (severe): Secondary | ICD-10-CM | POA: Diagnosis not present

## 2020-01-08 DIAGNOSIS — I5030 Unspecified diastolic (congestive) heart failure: Secondary | ICD-10-CM | POA: Diagnosis not present

## 2020-01-08 DIAGNOSIS — I129 Hypertensive chronic kidney disease with stage 1 through stage 4 chronic kidney disease, or unspecified chronic kidney disease: Secondary | ICD-10-CM | POA: Diagnosis not present

## 2020-01-08 DIAGNOSIS — E1122 Type 2 diabetes mellitus with diabetic chronic kidney disease: Secondary | ICD-10-CM | POA: Diagnosis not present

## 2020-01-14 DIAGNOSIS — I482 Chronic atrial fibrillation, unspecified: Secondary | ICD-10-CM | POA: Diagnosis not present

## 2020-01-16 ENCOUNTER — Observation Stay (HOSPITAL_COMMUNITY)
Admission: EM | Admit: 2020-01-16 | Discharge: 2020-01-17 | Disposition: A | Discharge status: Home / Self Care | Payer: Medicare Other | Attending: Cardiology | Admitting: Cardiology

## 2020-01-16 ENCOUNTER — Telehealth (HOSPITAL_COMMUNITY): Payer: Self-pay | Admitting: *Deleted

## 2020-01-16 ENCOUNTER — Emergency Department (HOSPITAL_COMMUNITY): Payer: Medicare Other

## 2020-01-16 ENCOUNTER — Encounter (HOSPITAL_COMMUNITY): Payer: Self-pay | Admitting: Physician Assistant

## 2020-01-16 ENCOUNTER — Other Ambulatory Visit (HOSPITAL_COMMUNITY): Payer: Self-pay | Admitting: Cardiology

## 2020-01-16 ENCOUNTER — Other Ambulatory Visit: Payer: Self-pay

## 2020-01-16 DIAGNOSIS — Z8673 Personal history of transient ischemic attack (TIA), and cerebral infarction without residual deficits: Secondary | ICD-10-CM | POA: Insufficient documentation

## 2020-01-16 DIAGNOSIS — Z6832 Body mass index (BMI) 32.0-32.9, adult: Secondary | ICD-10-CM | POA: Insufficient documentation

## 2020-01-16 DIAGNOSIS — K219 Gastro-esophageal reflux disease without esophagitis: Secondary | ICD-10-CM | POA: Diagnosis not present

## 2020-01-16 DIAGNOSIS — R079 Chest pain, unspecified: Secondary | ICD-10-CM

## 2020-01-16 DIAGNOSIS — E785 Hyperlipidemia, unspecified: Secondary | ICD-10-CM | POA: Diagnosis not present

## 2020-01-16 DIAGNOSIS — I248 Other forms of acute ischemic heart disease: Principal | ICD-10-CM | POA: Insufficient documentation

## 2020-01-16 DIAGNOSIS — I130 Hypertensive heart and chronic kidney disease with heart failure and stage 1 through stage 4 chronic kidney disease, or unspecified chronic kidney disease: Secondary | ICD-10-CM | POA: Diagnosis not present

## 2020-01-16 DIAGNOSIS — I4891 Unspecified atrial fibrillation: Secondary | ICD-10-CM

## 2020-01-16 DIAGNOSIS — N1832 Chronic kidney disease, stage 3b: Secondary | ICD-10-CM | POA: Insufficient documentation

## 2020-01-16 DIAGNOSIS — Z79899 Other long term (current) drug therapy: Secondary | ICD-10-CM | POA: Insufficient documentation

## 2020-01-16 DIAGNOSIS — E1122 Type 2 diabetes mellitus with diabetic chronic kidney disease: Secondary | ICD-10-CM | POA: Diagnosis not present

## 2020-01-16 DIAGNOSIS — I2510 Atherosclerotic heart disease of native coronary artery without angina pectoris: Secondary | ICD-10-CM | POA: Diagnosis not present

## 2020-01-16 DIAGNOSIS — Z20822 Contact with and (suspected) exposure to covid-19: Secondary | ICD-10-CM | POA: Insufficient documentation

## 2020-01-16 DIAGNOSIS — Z9884 Bariatric surgery status: Secondary | ICD-10-CM | POA: Insufficient documentation

## 2020-01-16 DIAGNOSIS — Z794 Long term (current) use of insulin: Secondary | ICD-10-CM | POA: Insufficient documentation

## 2020-01-16 DIAGNOSIS — N189 Chronic kidney disease, unspecified: Secondary | ICD-10-CM | POA: Diagnosis present

## 2020-01-16 DIAGNOSIS — Z7901 Long term (current) use of anticoagulants: Secondary | ICD-10-CM | POA: Diagnosis not present

## 2020-01-16 DIAGNOSIS — I4819 Other persistent atrial fibrillation: Secondary | ICD-10-CM | POA: Insufficient documentation

## 2020-01-16 DIAGNOSIS — I214 Non-ST elevation (NSTEMI) myocardial infarction: Secondary | ICD-10-CM | POA: Diagnosis not present

## 2020-01-16 DIAGNOSIS — I252 Old myocardial infarction: Secondary | ICD-10-CM | POA: Insufficient documentation

## 2020-01-16 DIAGNOSIS — G4733 Obstructive sleep apnea (adult) (pediatric): Secondary | ICD-10-CM | POA: Diagnosis not present

## 2020-01-16 DIAGNOSIS — Z952 Presence of prosthetic heart valve: Secondary | ICD-10-CM | POA: Insufficient documentation

## 2020-01-16 DIAGNOSIS — I5032 Chronic diastolic (congestive) heart failure: Secondary | ICD-10-CM | POA: Diagnosis not present

## 2020-01-16 DIAGNOSIS — E1129 Type 2 diabetes mellitus with other diabetic kidney complication: Secondary | ICD-10-CM | POA: Diagnosis present

## 2020-01-16 DIAGNOSIS — N184 Chronic kidney disease, stage 4 (severe): Secondary | ICD-10-CM | POA: Diagnosis present

## 2020-01-16 DIAGNOSIS — N183 Chronic kidney disease, stage 3 unspecified: Secondary | ICD-10-CM | POA: Diagnosis present

## 2020-01-16 HISTORY — DX: Non-ST elevation (NSTEMI) myocardial infarction: I21.4

## 2020-01-16 LAB — GLUCOSE, CAPILLARY: Glucose-Capillary: 367 mg/dL — ABNORMAL HIGH (ref 70–99)

## 2020-01-16 LAB — BASIC METABOLIC PANEL
Anion gap: 15 (ref 5–15)
BUN: 55 mg/dL — ABNORMAL HIGH (ref 8–23)
CO2: 24 mmol/L (ref 22–32)
Calcium: 8.9 mg/dL (ref 8.9–10.3)
Chloride: 99 mmol/L (ref 98–111)
Creatinine, Ser: 3.07 mg/dL — ABNORMAL HIGH (ref 0.44–1.00)
GFR calc Af Amer: 17 mL/min — ABNORMAL LOW (ref >60)
GFR calc non Af Amer: 15 mL/min — ABNORMAL LOW (ref >60)
Glucose, Bld: 312 mg/dL — ABNORMAL HIGH (ref 70–99)
Potassium: 4 mmol/L (ref 3.5–5.1)
Sodium: 138 mmol/L (ref 135–145)

## 2020-01-16 LAB — CBC
HCT: 36.3 % (ref 36.0–46.0)
Hemoglobin: 11.3 g/dL — ABNORMAL LOW (ref 12.0–15.0)
MCH: 25.3 pg — ABNORMAL LOW (ref 26.0–34.0)
MCHC: 31.1 g/dL (ref 30.0–36.0)
MCV: 81.4 fL (ref 80.0–100.0)
Platelets: 238 10*3/uL (ref 150–400)
RBC: 4.46 MIL/uL (ref 3.87–5.11)
RDW: 15.3 % (ref 11.5–15.5)
WBC: 8.5 10*3/uL (ref 4.0–10.5)
nRBC: 0 % (ref 0.0–0.2)

## 2020-01-16 LAB — SARS CORONAVIRUS 2 BY RT PCR (HOSPITAL ORDER, PERFORMED IN ~~LOC~~ HOSPITAL LAB): SARS Coronavirus 2: NEGATIVE

## 2020-01-16 LAB — TROPONIN I (HIGH SENSITIVITY)
Troponin I (High Sensitivity): 2473 ng/L (ref <18)
Troponin I (High Sensitivity): 6777 ng/L (ref <18)
Troponin I (High Sensitivity): 7661 ng/L (ref <18)
Troponin I (High Sensitivity): 9090 ng/L (ref <18)

## 2020-01-16 LAB — MAGNESIUM: Magnesium: 2.1 mg/dL (ref 1.7–2.4)

## 2020-01-16 LAB — HEMOGLOBIN A1C
Hgb A1c MFr Bld: 10.2 % — ABNORMAL HIGH (ref 4.8–5.6)
Mean Plasma Glucose: 246.04 mg/dL

## 2020-01-16 MED ORDER — ICOSAPENT ETHYL 1 G PO CAPS
2.0000 g | ORAL_CAPSULE | Freq: Two times a day (BID) | ORAL | Status: DC
  Administered 2020-01-16 – 2020-01-17 (×2): 2 g via ORAL
  Filled 2020-01-16 (×4): qty 2

## 2020-01-16 MED ORDER — INSULIN ASPART 100 UNIT/ML ~~LOC~~ SOLN
0.0000 [IU] | Freq: Three times a day (TID) | SUBCUTANEOUS | Status: DC
  Administered 2020-01-17: 5 [IU] via SUBCUTANEOUS
  Administered 2020-01-17: 15 [IU] via SUBCUTANEOUS

## 2020-01-16 MED ORDER — CARVEDILOL 25 MG PO TABS
25.0000 mg | ORAL_TABLET | Freq: Two times a day (BID) | ORAL | Status: DC
  Administered 2020-01-17: 25 mg via ORAL
  Filled 2020-01-16: qty 1

## 2020-01-16 MED ORDER — ACETAMINOPHEN 325 MG PO TABS: 650.0000 mg | ORAL_TABLET | ORAL | Status: DC | PRN

## 2020-01-16 MED ORDER — INSULIN NPH (HUMAN) (ISOPHANE) 100 UNIT/ML ~~LOC~~ SUSP
25.0000 [IU] | Freq: Three times a day (TID) | SUBCUTANEOUS | Status: DC
  Administered 2020-01-17 (×2): 25 [IU] via SUBCUTANEOUS
  Filled 2020-01-16: qty 10

## 2020-01-16 MED ORDER — TAFAMIDIS 61 MG PO CAPS: 61.0000 mg | ORAL_CAPSULE | Freq: Every day | ORAL | Status: DC

## 2020-01-16 MED ORDER — SODIUM CHLORIDE 0.9% FLUSH
3.0000 mL | Freq: Two times a day (BID) | INTRAVENOUS | Status: DC
  Administered 2020-01-16 – 2020-01-17 (×2): 3 mL via INTRAVENOUS

## 2020-01-16 MED ORDER — ETOMIDATE 2 MG/ML IV SOLN
INTRAVENOUS | Status: AC | PRN
  Administered 2020-01-16: 8 mg via INTRAVENOUS

## 2020-01-16 MED ORDER — SODIUM CHLORIDE 0.9 % IV SOLN
INTRAVENOUS | Status: AC | PRN
  Administered 2020-01-16: 1000 mL via INTRAVENOUS

## 2020-01-16 MED ORDER — APIXABAN 5 MG PO TABS
5.0000 mg | ORAL_TABLET | Freq: Two times a day (BID) | ORAL | Status: DC
  Administered 2020-01-16 – 2020-01-17 (×2): 5 mg via ORAL
  Filled 2020-01-16 (×3): qty 1

## 2020-01-16 MED ORDER — TORSEMIDE 20 MG PO TABS
20.0000 mg | ORAL_TABLET | ORAL | Status: DC
  Administered 2020-01-17: 20 mg via ORAL
  Filled 2020-01-16: qty 1

## 2020-01-16 MED ORDER — INSULIN ASPART 100 UNIT/ML ~~LOC~~ SOLN
0.0000 [IU] | Freq: Every day | SUBCUTANEOUS | Status: DC
  Administered 2020-01-16: 5 [IU] via SUBCUTANEOUS

## 2020-01-16 MED ORDER — SODIUM CHLORIDE 0.9% FLUSH: 3.0000 mL | INTRAVENOUS | Status: DC | PRN

## 2020-01-16 MED ORDER — NITROGLYCERIN 0.4 MG SL SUBL: 0.4000 mg | SUBLINGUAL_TABLET | SUBLINGUAL | Status: DC | PRN

## 2020-01-16 MED ORDER — SODIUM CHLORIDE 0.9 % IV SOLN: 250.0000 mL | INTRAVENOUS | Status: DC | PRN

## 2020-01-16 MED ORDER — FENOFIBRATE 54 MG PO TABS
54.0000 mg | ORAL_TABLET | Freq: Every day | ORAL | Status: DC
  Administered 2020-01-17: 54 mg via ORAL
  Filled 2020-01-16: qty 1

## 2020-01-16 MED ORDER — ZOLPIDEM TARTRATE 5 MG PO TABS: 5.0000 mg | ORAL_TABLET | Freq: Every evening | ORAL | Status: DC | PRN

## 2020-01-16 MED ORDER — ALPRAZOLAM 0.25 MG PO TABS: 0.2500 mg | ORAL_TABLET | Freq: Two times a day (BID) | ORAL | Status: DC | PRN

## 2020-01-16 MED ORDER — ETOMIDATE 2 MG/ML IV SOLN
8.0000 mg | Freq: Once | INTRAVENOUS | Status: AC
  Administered 2020-01-16: 8 mg via INTRAVENOUS
  Filled 2020-01-16: qty 10

## 2020-01-16 MED ORDER — HYDRALAZINE HCL 10 MG PO TABS
10.0000 mg | ORAL_TABLET | Freq: Two times a day (BID) | ORAL | Status: DC
  Administered 2020-01-16 – 2020-01-17 (×2): 10 mg via ORAL
  Filled 2020-01-16 (×3): qty 1

## 2020-01-16 MED ORDER — ONDANSETRON HCL 4 MG/2ML IJ SOLN: 4.0000 mg | Freq: Four times a day (QID) | INTRAMUSCULAR | Status: DC | PRN

## 2020-01-16 MED ORDER — TORSEMIDE 20 MG PO TABS
20.0000 mg | ORAL_TABLET | ORAL | Status: DC
  Administered 2020-01-16: 20 mg via ORAL
  Filled 2020-01-16: qty 1

## 2020-01-16 NOTE — ED Notes (Signed)
[employer]

## 2020-01-16 NOTE — Telephone Encounter (Signed)
Patient called in stating she went into AF at 3am this morning woke from sleep with chest pain. She took her BP it was in the 180s/90s HR 140s. She took her 25mg  of coreg. She has had diarrhea for the last several days. AFter taking coreg her BP dropped into the 70s/50s HR continued in the 130-140s but CP subsided after coreg.  Patient calls at 12pm with HR of 133 BP 109/66. Discussed above with Roderic Palau NP recommended trying cardizem 30mg  tablets PRN for elevated HR -- when I returned call to patient to give recommendations pts BP was back down to 77/54 HR 139 (despite increased fluid intake for hypotension)  Decision was then made that patient should proceed to ER for further assessment as unable to titrate medications with significant hypotension. Pt denies shortness of breath or chest pain but doesn't endorse feeling very weak. Pt in agreement to proceed to ER for fluid resuscitation and possible cardioversion in ER. Patient has follow up with Dr. Rayann Heman on Monday to discuss possible afib ablation.

## 2020-01-16 NOTE — ED Notes (Signed)
RN obtained informed consent for cardioversion

## 2020-01-16 NOTE — Plan of Care (Signed)
Alert and oriented X4. Pt has been oriented to the room. Skin warm and dry, Call bell within reach. No respiratory distress noted. Will continue monitoring the patient.

## 2020-01-16 NOTE — ED Triage Notes (Signed)
Pt reports chest pain that started last night, hx of a fib HR in 150s in triage. Pt a.o, nad noted.

## 2020-01-16 NOTE — ED Provider Notes (Signed)
[facility] EMERGENCY DEPARTMENT Provider Note   CSN: [id number] Arrival date & time: 01/16/20  1425     History Chief Complaint  Patient presents with  . Atrial Fibrillation  . Chest Pain    [name] is a 69 y.o. female with history of A. fib, CAD, CKD, history of difficult intubation, esophageal stricture, GERD, IBS, HTN, HLD, with obesity, TIA, severe aortic stenosis requiring TAVR, DM 2, sleep apnea  HPI  Patient is a 69 year old female with a past medical history significant for A. fib diagnosed in 2019 was briefly treated with amiodarone but was taken back off this as she had no episodes of PAF.  She is currently on Eliquis and takes carvedilol and Lasix states that she has no missed doses of apixaban since greater than 1 month ago.  She states that she has been taking Lasix and has good urine output.  Also no missed doses of carvedilol.  She is that she woke up this morning at approximately 3 AM and felt some chest pressure and chest pain on her left side.  She said it was nonradiating with no accompanied nausea, shortness of breath or vomiting.  She states that she also felt that she had some heart palpitations.  She states that this felt similar to her prior episodes where she has been in A. fib in the past.  On carvedilol and apixaban and lasix denies any missed doses of any of the medications.     Past Medical History:  Diagnosis Date  . Atrial fibrillation ([place])    a. maintaining sinus s/p DCCV, on Eliquis  . CAD (coronary artery disease)    a. Nonobstructive by cath 2006 - 25% LAD, 25-30% after 1st diag, distal 25% PDA, minor irreg LCx. b. Normal nuc 2011.  . CKD (chronic kidney disease), stage III   . Cystocele   . Difficult intubation 09-07-2012   big neck trouble with intubation parotid surgery"takes little med to sedate"  . Esophageal stricture   . GERD (gastroesophageal reflux disease)   . Hiatal hernia    a. 07/2013: HH with small  stricture holding up barium tablet (concides with patient's sx of food sticking).  . History of kidney stones   . Hyperlipidemia   . Hypertension   . IBS (irritable bowel syndrome)   . Memory difficulty 11/26/2015  . Morbid obesity ([place])    a. hx of gastric bypass (sleeve gastrectomy 2015)  . Non-STEMI (non-ST elevated myocardial infarction) ([place]) 01/16/2020  . Osteoporosis   . 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  Marland Kitchen Pericardial effusion    a. Mod by echo 2012 at time of PNA. b. Echo 07/2012: small pericardial effusion vs fat.  . Pressure ulcer 04/16/2019   right foot  . S/P TAVR (transcatheter aortic valve replacement)   . Severe aortic stenosis   . Sleep apnea     "have mask; don't use it" (02/14/2018)  . TIA (transient ischemic attack) 10/2014; 06/2016   "some memory issues since; daughter says my speech is sometimes different" (02/14/2018)  . Type II diabetes mellitus [facility])     Patient Active Problem List   Diagnosis Date Noted  . Non-STEMI (non-ST elevated myocardial infarction) ([place]) 01/16/2020  . NSTEMI (non-ST elevated myocardial infarction) ([place]) 01/16/2020  . Acute on chronic diastolic heart failure ([place]) 12/09/2019  . S/P transmetatarsal amputation of foot, left ([place]) 10/23/2019  . Subacute bacterial endocarditis 05/21/2019  . 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 Bacteremia due to Enterococcus   . Pressure injury of skin 04/16/2019  . Infected wound 04/16/2019  . Weakness generalized 04/15/2019  . SIRS (systemic inflammatory response syndrome) ([place]) 04/15/2019  . Anasarca 09/19/2018  . Acute encephalopathy 06/16/2018  . 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 Acute on chronic diastolic CHF (congestive heart failure) ([place]) 05/10/2018  . Morbid obesity ([place])   . Hypertension associated with diabetes ([place])   . CKD (chronic kidney disease), stage III   . Sleep apnea   . Hyperlipidemia   . GERD (gastroesophageal reflux disease)   . Persistent  atrial fibrillation with rapid ventricular response ([place])   . S/P TAVR (transcatheter aortic valve replacement)   . Severe aortic stenosis   . Vertigo 05/06/2015  . TIA (transient ischemic attack), history of 11/07/2014  . 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 CKD stage 3 due to type 2 diabetes mellitus ([place]) 06/03/2014  . Type II diabetes mellitus with renal manifestations ([place]) 06/03/2014  . Type II diabetes mellitus with peripheral circulatory disorder ([place]) 06/03/2014  . Hyperlipidemia associated with type 2 diabetes mellitus ([place]) 12/26/2008  . Benign essential HTN 12/26/2008  . Coronary artery disease, non-occlusive 12/26/2008  . Pericardial effusion 12/26/2008    Past Surgical History:  Procedure Laterality Date  . BREATH TEK H PYLORI N/A 07/29/2013   Procedure: [place];  Surgeon: Shann Medal, MD;  Location: [facility];  Service: General;  Laterality: N/A;  . CARDIAC CATHETERIZATION  2006  . CARDIOVERSION N/A 02/16/2018   Procedure: CARDIOVERSION;  Surgeon: Pixie Casino, MD;  Location: [facility];  Service: Cardiovascular;  Laterality: N/A;  . CARPAL TUNNEL RELEASE Left 2011  . CATARACT EXTRACTION W/ INTRAOCULAR LENS  IMPLANT, BILATERAL Bilateral 2003   left  . CHOLECYSTECTOMY OPEN  1982  . COLONOSCOPY N/A 07/14/2015   Procedure: COLONOSCOPY;  Surgeon: Ladene Artist, MD;  Location: [facility];  Service: Endoscopy;  Laterality: N/A;  . ESOPHAGOGASTRODUODENOSCOPY N/A 12/27/2013   Procedure: ESOPHAGOGASTRODUODENOSCOPY (EGD);  Surgeon: Shann Medal, MD;  Location: [facility];  Service: General;  Laterality: N/A;  . ESOPHAGOGASTRODUODENOSCOPY (EGD) WITH ESOPHAGEAL DILATION    . INTRAOPERATIVE TRANSTHORACIC ECHOCARDIOGRAM  05/08/2018   Procedure: INTRAOPERATIVE TRANSTHORACIC ECHOCARDIOGRAM;  Surgeon: Burnell Blanks, MD;  Location: [facility];  Service: Open Heart Surgery;;  . IR FLUORO GUIDE CV LINE RIGHT  04/25/2019  . IR REMOVAL TUN CV CATH W/O FL   06/20/2019  . IR US GUIDE VASC ACCESS RIGHT  04/25/2019  . LAPAROSCOPIC GASTRIC SLEEVE RESECTION N/A 01/20/2014   Procedure: LAPAROSCOPIC GASTRIC SLEEVE RESECTION;  Surgeon: Shann Medal, MD;  Location: [facility];  Service: General;  Laterality: N/A;  . PAROTIDECTOMY  09/07/2012   Procedure: PAROTIDECTOMY;  Surgeon: Melida Quitter, MD;  Location: [facility];  Service: ENT;  Laterality: Left;  LEFT PAROTIDECTOMY  . RIGHT HEART CATH N/A 09/25/2018   Procedure: RIGHT HEART CATH;  Surgeon: Larey Dresser, MD;  Location: [facility];  Service: Cardiovascular;  Laterality: N/A;  . RIGHT/LEFT HEART CATH AND CORONARY ANGIOGRAPHY N/A 03/29/2018   Procedure: RIGHT/LEFT HEART CATH AND CORONARY ANGIOGRAPHY;  Surgeon: Burnell Blanks, MD;  Location: [facility];  Service: Cardiovascular;  Laterality: N/A;  . TEE WITHOUT CARDIOVERSION N/A 02/16/2018   Procedure: TRANSESOPHAGEAL ECHOCARDIOGRAM (TEE);  Surgeon: Pixie Casino, MD;  Location: [facility];  Service: Cardiovascular;  Laterality: N/A;  . TEE WITHOUT CARDIOVERSION N/A 06/05/2019   Procedure: TRANSESOPHAGEAL ECHOCARDIOGRAM (TEE);  Surgeon: Sanda Klein, MD;  Location: [facility];  Service: Cardiovascular;  Laterality: N/A;  . TEE WITHOUT CARDIOVERSION N/A 04/24/2019   Procedure: TRANSESOPHAGEAL ECHOCARDIOGRAM (TEE);  Surgeon: Sanda Klein, MD;  Location: [facility];  Service: Cardiovascular;  Laterality: N/A;  . TOE AMPUTATION Left 07/2017   great toe; [facility]   . TONSILLECTOMY  1968  . TRANSCATHETER AORTIC VALVE REPLACEMENT, TRANSFEMORAL  05/08/2018   Transcatheter Aortic Valve Replacement   . TRANSCATHETER AORTIC VALVE REPLACEMENT, TRANSFEMORAL N/A 05/08/2018   Procedure: TRANSCATHETER AORTIC VALVE REPLACEMENT, TRANSFEMORAL;  Surgeon: Burnell Blanks, MD;  Location: [facility];  Service: Open Heart Surgery;  Laterality: N/A;  . TUBAL LIGATION  yrs ago  . UPPER GI ENDOSCOPY  01/20/2014   Procedure: UPPER GI ENDOSCOPY;  Surgeon:  Shann Medal, MD;  Location[facility];  Service: General;;     OB History    Gravida  3   Para  3   Term  3   Preterm      AB      Living  3     SAB      TAB      Ectopic      Multiple      Live Births  3           Family History  Problem Relation Age of Onset  . Heart failure Mother   . Hypertension Mother   . Skin cancer Mother   . Colitis Mother   . Diabetes Mother   . Kidney Stones Mother   . Stroke Mother   . Heart disease Father   . Colon polyps Father   . Diabetes Father   . Heart failure Father   . Heart attack Sister   . Diabetes Other   . Stroke Maternal Grandfather   . Diabetes Maternal Grandfather   . Colon cancer Neg Hx     Social History   Tobacco Use  . Smoking status: Never Smoker  . Smokeless tobacco: Never Used  Substance Use Topics  . Alcohol use: Yes    Alcohol/week: 0.0 standard drinks    Comment:  "mixed drink q few years"  . Drug use: Never    Home Medications Prior to Admission medications   Medication Sig Start Date End Date Taking? Authorizing Provider  acetaminophen (TYLENOL) 500 MG tablet Take 1,000 mg by mouth as needed for mild pain.    Yes [provider]  apixaban (ELIQUIS) 5 MG TABS tablet Take 1 tablet (5 mg total) by mouth 2 (two) times daily. 10/04/19  Yes Minus Breeding, MD  Calcium Carbonate (CALCIUM 600 PO) Take 1 tablet by mouth daily.   Yes [provider]  carvedilol (COREG) 25 MG tablet Take 25 mg by mouth 2 (two) times daily with a meal.   Yes [provider]  Cholecalciferol (VITAMIN D3 PO) 2 gummies by mouth daily   Yes [provider]  Cyanocobalamin (VITAMIN B-12 PO) Take by mouth. 2 gummies by mouth daily   Yes [provider]  fenofibrate (TRICOR) 145 MG tablet TAKE 1 TABLET BY MOUTH EVERY DAY Patient taking differently: Take 145 mg by mouth daily.  12/16/19  Yes Larey Dresser, MD  hydrALAZINE (APRESOLINE) 10 MG tablet Take 10 mg by mouth 2 (two)  times daily.    Yes [provider]  insulin NPH Human (NOVOLIN N) 100 UNIT/ML injection Inject 25-50 Units into the skin See admin instructions. 50 units in the am 60 units at  lunch and 20 units for supper - on a sliding scale   Yes [provider]  LIVALO 4 MG TABS Take 4 mg by mouth every evening.   Yes [provider]  meclizine (ANTIVERT) 12.5 MG tablet Take 1 tablet (12.5 mg total) by mouth 3 (three) times daily as needed for dizziness. 06/14/19  Yes Larey Dresser, MD  Multiple Vitamins-Minerals (MULTIVITAMIN GUMMIES ADULT PO) Taking two gummies by mouth daily   Yes [provider]  nutrition supplement, JUVEN, (JUVEN) PACK Take 1 packet by mouth 2 (two) times daily between meals. 04/26/19  Yes Swayze, Ava, DO  ondansetron (ZOFRAN ODT) 4 MG disintegrating tablet Take 1 tablet (4 mg total) by mouth every 8 (eight) hours as needed for nausea. Patient taking differently: Take 4 mg by mouth as needed for nausea.  01/03/17  Yes Larene Pickett, PA-C  PRALUENT 75 MG/ML SOAJ INJECT 75 MG INTO THE SKIN EVERY 14 (FOURTEEN) DAYS. 11/18/19  Yes Larey Dresser, MD  RELION INSULIN SYRINGE 31G X 15/64" 1 ML MISC Inject 1 Syringe into the skin 3 (three) times daily. 01/04/20  Yes [provider]  Tafamidis (VYNDAMAX) 61 MG CAPS Take 61 mg by mouth daily. 07/29/19  Yes Larey Dresser, MD  torsemide (DEMADEX) 20 MG tablet Take 20 mg twice a day and alternate with 20 mg daily the next 12/10/19  Yes Clegg, Amy D, NP  ACCU-CHEK AVIVA PLUS test strip USE TO CHECK BLOOD SUGAR UP TO TWICE DAILY OR AS INSTRUCTED 09/04/17   Chipper Herb, MD  B-D INS SYR ULTRAFINE 1CC/31G 31G X 5/16" 1 ML MISC USE TO INJECT INSULIN TWICE A DAY AS INSTRUCTED Patient taking differently: 1 Stick by Other route 3 (three) times daily before meals.  11/16/15   Chipper Herb, MD  Insulin Pen Needle 32G X 4 MM MISC Use to inject insulin with insulin pen 05/17/16   Chipper Herb, MD  VASCEPA 1 g  capsule TAKE 2 CAPSULES (2 G TOTAL) BY MOUTH 2 (TWO) TIMES A DAY. 01/16/20   Larey Dresser, MD  Olmesartan-Amlodipine-HCTZ [facility]) 40-5-12.5 MG TABS Take 1 tablet by mouth daily.    04/12/18  [provider]    Allergies    Amlodipine, Atorvastatin, Crestor [rosuvastatin calcium], Ezetimibe-simvastatin, Sitagliptin, Statins, Celebrex [celecoxib], and Levemir [insulin detemir]  Review of Systems   Review of Systems  Constitutional: Positive for fatigue. Negative for fever.  HENT: Negative for congestion.   Respiratory: Negative for cough, chest tightness and shortness of breath.   Cardiovascular: Positive for chest pain and palpitations. Negative for leg swelling.  Gastrointestinal: Negative for abdominal pain, diarrhea, nausea and vomiting.  Genitourinary: Negative for dysuria and hematuria.  Musculoskeletal: Negative for myalgias.  Skin: Negative for rash.  Neurological: Negative for dizziness and headaches.  Psychiatric/Behavioral: Negative for agitation.    Physical Exam Updated Vital Signs BP 131/74   Pulse 82   Temp 98.4 F (36.9 C) (Oral)   Resp 19   Ht 5\' 7"  (1.702 m)   Wt 90.7 kg   SpO2 98%   BMI 31.32 kg/m   Physical Exam Vitals and nursing note reviewed.  Constitutional:      General: She is not in acute distress.    Appearance: She is obese.  HENT:     Head: Normocephalic and atraumatic.     Nose: Nose normal.     Mouth/Throat:     Mouth: Mucous membranes are moist.  Eyes:  General: No scleral icterus. Cardiovascular:     Rate and Rhythm: Tachycardia present. Rhythm irregular.     Pulses: Normal pulses.     Heart sounds: Normal heart sounds.     Comments: Pulses bilaterally 3+ DP/PT/Radial Pulmonary:     Effort: Pulmonary effort is normal. No respiratory distress.     Breath sounds: No wheezing.  Abdominal:     Palpations: Abdomen is soft.     Tenderness: There is no abdominal tenderness. There is no guarding or rebound.    Musculoskeletal:     Cervical back: Normal range of motion.     Right lower leg: No edema.     Left lower leg: No edema.  Skin:    General: Skin is warm and dry.     Capillary Refill: Capillary refill takes less than 2 seconds.  Neurological:     Mental Status: She is alert. Mental status is at baseline.  Psychiatric:        Mood and Affect: Mood normal.        Behavior: Behavior normal.     ED Results / Procedures / Treatments   Labs (all labs ordered are listed, but only abnormal results are displayed) Labs Reviewed  BASIC METABOLIC PANEL - Abnormal; Notable for the following components:      Result Value   Glucose, Bld 312 (*)    BUN 55 (*)    Creatinine, Ser 3.07 (*)    GFR calc non Af Amer 15 (*)    GFR calc Af Amer 17 (*)    All other components within normal limits  CBC - Abnormal; Notable for the following components:   Hemoglobin 11.3 (*)    MCH 25.3 (*)    All other components within normal limits  TROPONIN I (HIGH SENSITIVITY) - Abnormal; Notable for the following components:   Troponin I (High Sensitivity) 2,473 (*)    All other components within normal limits  TROPONIN I (HIGH SENSITIVITY) - Abnormal; Notable for the following components:   Troponin I (High Sensitivity) 6,777 (*)    All other components within normal limits  MAGNESIUM  COMPREHENSIVE METABOLIC PANEL  HEMOGLOBIN A1C  TROPONIN I (HIGH SENSITIVITY)    EKG EKG Interpretation  Date/Time:  Thursday January 16 2020 16:39:48 EDT Ventricular Rate:  83 PR Interval:  140 QRS Duration: 88 QT Interval:  388 QTC Calculation: 455 R Axis:   23 Text Interpretation: Sinus rhythm with Premature atrial complexes Low voltage QRS ST & T wave abnormality, consider inferior ischemia ST & T wave abnormality, consider anterolateral ischemia Abnormal ECG Since prior ECG, pt now in sinus rhythm, rate has slowed, ST abnormalities noted on prior May 12 ECG Confirmed by Gareth Morgan [phone]) on 01/16/2020 5:32:40  PM   Radiology DG Chest Port 1 View  Result Date: 01/16/2020 CLINICAL DATA:  69 year old female with chest pain. EXAM: PORTABLE CHEST 1 VIEW COMPARISON:  Chest radiograph dated 12/09/2019. FINDINGS: No focal consolidation, pleural effusion or pneumothorax. Stable cardiomegaly. Aortic valve repair. Atherosclerotic calcification of the aorta. No acute osseous pathology. IMPRESSION: No acute findings.  No interval change. Electronically Signed   By: Anner Crete M.D.   On: 01/16/2020 15:46    Procedures .Critical Care Performed by: Tedd Sias, PA Authorized by: Tedd Sias, PA   Critical care provider statement:    Critical care time (minutes):  45   Critical care was necessary to treat or prevent imminent or life-threatening deterioration of the following conditions:  Shock  and cardiac failure (NSTEMI, afib RVR)   Critical care was time spent personally by me on the following activities:  Discussions with consultants, evaluation of patient's response to treatment, examination of patient, ordering and performing treatments and interventions, ordering and review of laboratory studies, ordering and review of radiographic studies, pulse oximetry, re-evaluation of patient's condition, obtaining history from patient or surrogate and review of old charts   (including critical care time)  Medications Ordered in ED Medications  nitroGLYCERIN (NITROSTAT) SL tablet 0.4 mg (has no administration in time range)  acetaminophen (TYLENOL) tablet 650 mg (has no administration in time range)  ondansetron (ZOFRAN) injection 4 mg (has no administration in time range)  zolpidem (AMBIEN) tablet 5 mg (has no administration in time range)  sodium chloride flush (NS) 0.9 % injection 3 mL (has no administration in time range)  sodium chloride flush (NS) 0.9 % injection 3 mL (has no administration in time range)  0.9 %  sodium chloride infusion (has no administration in time range)  ALPRAZolam (XANAX)  tablet 0.25 mg (has no administration in time range)  insulin aspart (novoLOG) injection 0-15 Units (has no administration in time range)  insulin aspart (novoLOG) injection 0-5 Units (has no administration in time range)  apixaban (ELIQUIS) tablet 5 mg (has no administration in time range)  carvedilol (COREG) tablet 25 mg (has no administration in time range)  fenofibrate tablet 54 mg (has no administration in time range)  hydrALAZINE (APRESOLINE) tablet 10 mg (has no administration in time range)  insulin NPH Human (NOVOLIN N) injection 25 Units (has no administration in time range)  Tafamidis CAPS 61 mg (has no administration in time range)  torsemide (DEMADEX) tablet 20 mg (has no administration in time range)  icosapent Ethyl (VASCEPA) 1 g capsule 2 g (has no administration in time range)  torsemide (DEMADEX) tablet 20 mg (has no administration in time range)  etomidate (AMIDATE) injection 8 mg (8 mg Intravenous Given 01/16/20 1701)  etomidate (AMIDATE) injection (8 mg Intravenous Given 01/16/20 1634)  0.9 %  sodium chloride infusion ( Intravenous Stopped 01/16/20 1701)    ED Course  I have reviewed the triage vital signs and the nursing notes.  Pertinent labs & imaging results that were available during my care of the patient were reviewed by me and considered in my medical decision making (see chart for details).  Patient is 69 year old female with past medical history detailed above presented today with chest pain and palpitations.  She has a history of A. fib RVR.  She has no missed doses of her anticoagulant over the past month.  She states that her symptoms came on approximately 3 AM this morning.  She has had good response to DC cardioversion in the past.  She has mild hypertension states that she did feel lightheaded prior to arrival in the ED.  Discussed case with my attending physician.  Decision was made to cardiovert patient.  Prior to cardioversion patient was found to have a  troponin greater than 2000.  Cardiology was consulted who will see patient at bedside.  DC cardioversion was successful.  Patient in normal sinus rhythm.  Tolerated procedure well.  CBC without leukocytosis or significant anemia.  BMP with glucose of 312.  Does have mild increase in creatinine and BUN from prior however this appears to be slowly trending up with time.  Clinically she does appear to be somewhat dehydrated.  Magnesium within normal limits.  Covid test negative.  Vital signs stable this time  patient being admitted by cardiologist.  She is chest pain-free after cardioversion.  Clinical Course as of Jan 16 1911  Thu Jan 16, 2020  1703 Called by lab for troponin > 2000   [WF]  1710 Discussed with cardiology who will assess pt at bedside.    [WF]    Clinical Course User Index [WF] Tedd Sias, [place]   MDM Rules/Calculators/A&P                      Final Clinical Impression(s) / ED Diagnoses Final diagnoses:  Atrial fibrillation with RVR ([place])  Chest pain, unspecified type    Rx / DC Orders ED Discharge Orders    None       Tedd Sias, [place] 01/17/20 1638    Gareth Morgan, MD 01/17/20 1330

## 2020-01-16 NOTE — ED Notes (Signed)
Patient presents to ed c/o chest pain with rapid hear trate states chest pain stopped without intervention. States she called her MD and was told to come to  ED for rapid heart rate. Deneis chest pain or sob states she was feeling very weak. States she has afib off and on was cardioversion. States she has appointment pm June 7 th with cards about ablation.

## 2020-01-16 NOTE — ED Notes (Signed)
Oxygen dropped to 88% while sleeping, 2L [place] applied, pt a.o, nad noted

## 2020-01-16 NOTE — Sedation Documentation (Addendum)
shock delivered at 1635

## 2020-01-16 NOTE — H&P (Addendum)
Cardiology History and Physical:   Patient ID: [name]; [id number]; 12/15/1950   Admit date: 01/16/2020 Date of Consult: 01/16/2020  Primary Care Provider: [name], DO Primary Cardiologist: Minus Breeding, MD 08/22/2018 Primary Electrophysiologist:  None has been seen in the A. fib clinic and has appointment with Dr. Rayann Heman   Patient Profile:   [name] is a 69 y.o. female with a hx of AS s/p TAVR, chronic diastolic CHF, PAF, CKD IIIb, HLD, HTN, OSA,TTR amyloidosis,morbid obesity, NSTEMI 2nd demand ischemia from PAF/RVR 11/2019, med  rx for distal LAD dz and mod RCA/CFX dz at cath, PAD s/p PTCA R pop & R AT 02/2019, L dSFA-TP & L TMA 09/2019, who is being seen today for the evaluation of NSTEMI at the request of Dr Billy Fischer.  History of Present Illness:   [name] was hospitalized-12/10/2019 for chest pain, non-STEMI by enzymes but felt demand ischemia secondary to atrial fibrillation, RVR.  She spontaneously converted to sinus rhythm.    She was referred to the A. fib clinic.  She was seen in the A. fib clinic on 5/12 and has an appointment with Dr. Rayann Heman.  She has otherwise been in her usual state of health.  She does not weigh regularly because she is afraid she will lose balance and fall off the scales.  She walks with a walker or uses a wheelchair, activity status is poor.  That said, her respiratory status has been at baseline with normal dyspnea on exertion, no orthopnea or PND.  Occasionally she will feel her heart pound, but it does not last very long and there are no associated symptoms.  At 3 AM today, she woke with her heart pounding and with substernal chest pain up to an 8/10.  There was no nausea, vomiting, or diaphoresis.  Because of the elevated heart rate, she took her carvedilol.  She had accidentally missed her a.m. dose yesterday, but took the p.m. dose which she took at 8:30 PM.  Her chest pain did not resolve and her heart rate did not  come down.  She was not able to really sleep.  In the morning, she took her blood pressure and it was in the 08M systolic.  She did not take her morning dose of hydralazine or carvedilol.  Her heart rate was in the 140s.  She was still having the chest pain.  When her symptoms did not resolve in the early afternoon, she called her daughter who told her to call our office.  She called the A. fib clinic who told her to come to the emergency room.  She came to the emergency room as directed.  After getting to the emergency room, her chest pain resolved but her heart rate remained elevated.  Her blood pressure was borderline, so she ended up getting successfully cardioverted and is now in sinus rhythm with PACs.  This is the first episode of chest pain she has had since she was hospitalized in April.  Her dyspnea on exertion is at baseline.  Now that she is back in sinus rhythm, she does not feel her heart rate.   Past Medical History:  Diagnosis Date  . Atrial fibrillation ([place])    a. maintaining sinus s/p DCCV, on Eliquis  . CAD (coronary artery disease)    a. Nonobstructive by cath 2006 - 25% LAD, 25-30% after 1st diag, distal 25% PDA, minor irreg LCx. b. Normal nuc 2011.  . CKD (chronic kidney disease), stage III   .  Cystocele   . Difficult intubation 09-07-2012   big neck trouble with intubation parotid surgery"takes little med to sedate"  . Esophageal stricture   . GERD (gastroesophageal reflux disease)   . Hiatal hernia    a. 07/2013: HH with small stricture holding up barium tablet (concides with patient's sx of food sticking).  . History of kidney stones   . Hyperlipidemia   . Hypertension   . IBS (irritable bowel syndrome)   . Memory difficulty 11/26/2015  . Morbid obesity ([name])    a. hx of gastric bypass (sleeve gastrectomy 2015)  . Non-STEMI (non-ST elevated myocardial infarction) ([name]) 01/16/2020  . Osteoporosis   . 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  Marland Kitchen Pericardial effusion    a. Mod by echo 2012 at time of PNA. b. Echo 07/2012: small pericardial effusion vs fat.  . Pressure ulcer 04/16/2019   right foot  . S/P TAVR (transcatheter aortic valve replacement)   . Severe aortic stenosis   . Sleep apnea     "have mask; don't use it" (02/14/2018)  . TIA (transient ischemic attack) 10/2014; 06/2016   "some memory issues since; daughter says my speech is sometimes different" (02/14/2018)  . Type II diabetes mellitus ([name])     Past Surgical History:  Procedure Laterality Date  . BREATH TEK H PYLORI N/A 07/29/2013   Procedure: Joanna;  Surgeon: Shann Medal, MD;  Location: [facility];  Service: General;  Laterality: N/A;  . CARDIAC CATHETERIZATION  2006  . CARDIOVERSION N/A 02/16/2018   Procedure: CARDIOVERSION;  Surgeon: Pixie Casino, MD;  Location: [facility];  Service: Cardiovascular;  Laterality: N/A;  . CARPAL TUNNEL RELEASE Left 2011  . CATARACT EXTRACTION W/ INTRAOCULAR LENS  IMPLANT, BILATERAL Bilateral 2003   left  . CHOLECYSTECTOMY OPEN  1982  . COLONOSCOPY N/A 07/14/2015   Procedure: COLONOSCOPY;  Surgeon: Ladene Artist, MD;  Location: [facility];  Service: Endoscopy;  Laterality: N/A;  . ESOPHAGOGASTRODUODENOSCOPY N/A 12/27/2013   Procedure: ESOPHAGOGASTRODUODENOSCOPY (EGD);  Surgeon: Shann Medal, MD;  Location: [facility];  Service: General;  Laterality: N/A;  . ESOPHAGOGASTRODUODENOSCOPY (EGD) WITH ESOPHAGEAL DILATION    . INTRAOPERATIVE TRANSTHORACIC ECHOCARDIOGRAM  05/08/2018   Procedure: INTRAOPERATIVE TRANSTHORACIC ECHOCARDIOGRAM;  Surgeon: Burnell Blanks, MD;  Location: [facility];  Service: Open Heart Surgery;;  . IR FLUORO GUIDE CV LINE RIGHT  04/25/2019  . IR REMOVAL TUN CV CATH W/O FL  06/20/2019  . IR US GUIDE VASC ACCESS RIGHT  04/25/2019  . LAPAROSCOPIC GASTRIC SLEEVE RESECTION N/A 01/20/2014   Procedure: LAPAROSCOPIC GASTRIC SLEEVE RESECTION;  Surgeon: Shann Medal, MD;   [facility];  Service: General;  Laterality: N/A;  . PAROTIDECTOMY  09/07/2012   Procedure: PAROTIDECTOMY;  Surgeon: Melida Quitter, MD;  Location: [facility];  Service: ENT;  Laterality: Left;  LEFT PAROTIDECTOMY  . RIGHT HEART CATH N/A 09/25/2018   Procedure: RIGHT HEART CATH;  Surgeon: Larey Dresser, MD;  Location: [facility];  Service: Cardiovascular;  Laterality: N/A;  . RIGHT/LEFT HEART CATH AND CORONARY ANGIOGRAPHY N/A 03/29/2018   Procedure: RIGHT/LEFT HEART CATH AND CORONARY ANGIOGRAPHY;  Surgeon: Burnell Blanks, MD;  Location: [facility];  Service: Cardiovascular;  Laterality: N/A;  . TEE WITHOUT CARDIOVERSION N/A 02/16/2018   Procedure: TRANSESOPHAGEAL ECHOCARDIOGRAM (TEE);  Surgeon: Pixie Casino, MD;  Location: [facility];  Service: Cardiovascular;  Laterality: N/A;  . TEE WITHOUT CARDIOVERSION N/A 06/05/2019  Procedure: TRANSESOPHAGEAL ECHOCARDIOGRAM (TEE);  Surgeon: Sanda Klein, MD;  Location: [facility];  Service: Cardiovascular;  Laterality: N/A;  . TEE WITHOUT CARDIOVERSION N/A 04/24/2019   Procedure: TRANSESOPHAGEAL ECHOCARDIOGRAM (TEE);  Surgeon: Sanda Klein, MD;  Location: [facility];  Service: Cardiovascular;  Laterality: N/A;  . TOE AMPUTATION Left 07/2017   great toe; [facility]   . TONSILLECTOMY  1968  . TRANSCATHETER AORTIC VALVE REPLACEMENT, TRANSFEMORAL  05/08/2018   Transcatheter Aortic Valve Replacement   . TRANSCATHETER AORTIC VALVE REPLACEMENT, TRANSFEMORAL N/A 05/08/2018   Procedure: TRANSCATHETER AORTIC VALVE REPLACEMENT, TRANSFEMORAL;  Surgeon: Burnell Blanks, MD;  Location: [facility];  Service: Open Heart Surgery;  Laterality: N/A;  . TUBAL LIGATION  yrs ago  . UPPER GI ENDOSCOPY  01/20/2014   Procedure: UPPER GI ENDOSCOPY;  Surgeon: Shann Medal, MD;  Location: [facility];  Service: General;;     Prior to Admission medications   Medication Sig Start Date End Date Taking? Authorizing Provider  acetaminophen (TYLENOL) 500  MG tablet Take 1,000 mg by mouth as needed for mild pain.    Yes [provider]  apixaban (ELIQUIS) 5 MG TABS tablet Take 1 tablet (5 mg total) by mouth 2 (two) times daily. 10/04/19  Yes Minus Breeding, MD  Calcium Carbonate (CALCIUM 600 PO) Take 1 tablet by mouth daily.   Yes [provider]  carvedilol (COREG) 25 MG tablet Take 25 mg by mouth 2 (two) times daily with a meal.   Yes [provider]  Cholecalciferol (VITAMIN D3 PO) 2 gummies by mouth daily   Yes [provider]  Cyanocobalamin (VITAMIN B-12 PO) Take by mouth. 2 gummies by mouth daily   Yes [provider]  fenofibrate (TRICOR) 145 MG tablet TAKE 1 TABLET BY MOUTH EVERY DAY Patient taking differently: Take 145 mg by mouth daily.  12/16/19  Yes Larey Dresser, MD  hydrALAZINE (APRESOLINE) 10 MG tablet Take 10 mg by mouth 2 (two) times daily.    Yes [provider]  insulin NPH Human (NOVOLIN N) 100 UNIT/ML injection Inject 25-50 Units into the skin See admin instructions. 50 units in the am 60 units at lunch and 20 units for supper - on a sliding scale   Yes [provider]  LIVALO 4 MG TABS Take 4 mg by mouth every evening.   Yes [provider]  meclizine (ANTIVERT) 12.5 MG tablet Take 1 tablet (12.5 mg total) by mouth 3 (three) times daily as needed for dizziness. 06/14/19  Yes Larey Dresser, MD  Multiple Vitamins-Minerals (MULTIVITAMIN GUMMIES ADULT PO) Taking two gummies by mouth daily   Yes [provider]  nutrition supplement, JUVEN, (JUVEN) PACK Take 1 packet by mouth 2 (two) times daily between meals. 04/26/19  Yes Swayze, Ava, DO  ondansetron (ZOFRAN ODT) 4 MG disintegrating tablet Take 1 tablet (4 mg total) by mouth every 8 (eight) hours as needed for nausea. Patient taking differently: Take 4 mg by mouth as needed for nausea.  01/03/17  Yes Larene Pickett, PA-C  PRALUENT 75 MG/ML SOAJ INJECT 75 MG INTO THE SKIN EVERY 14 (FOURTEEN) DAYS.  11/18/19  Yes Larey Dresser, MD  RELION INSULIN SYRINGE 31G X 15/64" 1 ML MISC Inject 1 Syringe into the skin 3 (three) times daily. 01/04/20  Yes [provider]  Tafamidis (VYNDAMAX) 61 MG CAPS Take 61 mg by mouth daily. 07/29/19  Yes Larey Dresser, MD  torsemide (DEMADEX) 20 MG tablet Take 20 mg twice a  day and alternate with 20 mg daily the next 12/10/19  Yes Clegg, Amy D, NP  ACCU-CHEK AVIVA PLUS test strip USE TO CHECK BLOOD SUGAR UP TO TWICE DAILY OR AS INSTRUCTED 09/04/17   Chipper Herb, MD  B-D INS SYR ULTRAFINE 1CC/31G 31G X 5/16" 1 ML MISC USE TO INJECT INSULIN TWICE A DAY AS INSTRUCTED Patient taking differently: 1 Stick by Other route 3 (three) times daily before meals.  11/16/15   Chipper Herb, MD  Insulin Pen Needle 32G X 4 MM MISC Use to inject insulin with insulin pen 05/17/16   Chipper Herb, MD  VASCEPA 1 g capsule TAKE 2 CAPSULES (2 G TOTAL) BY MOUTH 2 (TWO) TIMES A DAY. 01/16/20   Larey Dresser, MD  Olmesartan-Amlodipine-HCTZ [facility]) 40-5-12.5 MG TABS Take 1 tablet by mouth daily.    04/12/18  [provider]    Inpatient Medications: Scheduled Meds:  Continuous Infusions:  PRN Meds:   Allergies:    Allergies  Allergen Reactions  . Amlodipine Swelling    Ankle swelling  . Atorvastatin Other (See Comments)    Myalgias   . Crestor [Rosuvastatin Calcium] Other (See Comments)    Stiffness and back pain  . Ezetimibe-Simvastatin Other (See Comments)    Leg cramps  . Sitagliptin Other (See Comments)  . Statins Other (See Comments)    Myalgias  . Celebrex [Celecoxib] Rash  . Levemir [Insulin Detemir] Rash    Social History:   Social History   Socioeconomic History  . Marital status: Married    Spouse name: Not on file  . Number of children: 3  . Years of education: some college  . Highest education level: Some college, no degree  Occupational History  . Occupation: retired    Fish farm manager: [employer]  Tobacco Use  . Smoking  status: Never Smoker  . Smokeless tobacco: Never Used  Substance and Sexual Activity  . Alcohol use: Yes    Alcohol/week: 0.0 standard drinks    Comment:  "mixed drink q few years"  . Drug use: Never  . Sexual activity: Yes    Birth control/protection: Post-menopausal  Other Topics Concern  . Not on file  Social History Narrative   Married   Patient is right handed.   Patient rarely drinks caffeine.   Lives at home with husband in two story home. Husband washes laundry in the basement. They have 3 daughters and 7 grandchildren.    Social Determinants of Health   Financial Resource Strain:   . Difficulty of Paying Living Expenses:   Food Insecurity:   . Worried About Charity fundraiser in the Last Year:   . Arboriculturist in the Last Year:   Transportation Needs:   . Film/video editor (Medical):   Marland Kitchen Lack of Transportation (Non-Medical):   Physical Activity:   . Days of Exercise per Week:   . Minutes of Exercise per Session:   Stress:   . Feeling of Stress :   Social Connections:   . Frequency of Communication with Friends and Family:   . Frequency of Social Gatherings with Friends and Family:   . Attends Religious Services:   . Active Member of Clubs or Organizations:   . Attends Archivist Meetings:   Marland Kitchen Marital Status:   Intimate Partner Violence:   . Fear of Current or Ex-Partner:   . Emotionally Abused:   Marland Kitchen Physically Abused:   . Sexually Abused:  Family History:   Family History  Problem Relation Age of Onset  . Heart failure Mother   . Hypertension Mother   . Skin cancer Mother   . Colitis Mother   . Diabetes Mother   . Kidney Stones Mother   . Stroke Mother   . Heart disease Father   . Colon polyps Father   . Diabetes Father   . Heart failure Father   . Heart attack Sister   . Diabetes Other   . Stroke Maternal Grandfather   . Diabetes Maternal Grandfather   . Colon cancer Neg Hx    Family Status:  Family Status  Relation  Name Status  . Mother  Deceased at age 58       heart failure / stroke  . Father  Deceased at age 24       heart failure  . Sister  Deceased at age 37       heart attack  . Other  (Not Specified)  . MGM  Deceased at age 39  . MGF  Deceased  . Daughter  Alive  . Daughter  Alive  . Daughter  Alive  . Neg Hx  (Not Specified)    ROS:  Please see the history of present illness.  All other ROS reviewed and negative.     Physical Exam/Data:   Vitals:   01/16/20 1630 01/16/20 1634 01/16/20 1636 01/16/20 1650  BP: 113/81 (!) 110/92 115/88 (!) 125/110  Pulse: 75 (!) 115 82 80  Resp: (!) 22 (!) 30 16 (!) 25  Temp:      TempSrc:      SpO2: 99% 98% 99% 97%  Weight:      Height:       No intake or output data in the 24 hours ending 01/16/20 1758  Last 3 Weights 01/16/2020 12/25/2019 12/23/2019  Weight (lbs) 200 lb 201 lb 3.2 oz 202 lb  Weight (kg) 90.719 kg 91.264 kg 91.627 kg     Body mass index is 31.32 kg/m.   General:  Well nourished, morbidly obese, female in no acute distress HEENT: normal Lymph: no adenopathy Neck: JVD -not seen Endocrine:  No thryomegaly Vascular: No carotid bruits; upper extremity pulses 2+, unable to palpate lower extremity pulses, capillary refill within normal limits Cardiac:  normal S1, S2; RRR; no murmur Lungs: Few rales bases bilaterally, no wheezing, rhonchi   Abd: soft, nontender, no hepatomegaly  Ext: no edema Musculoskeletal:  No deformities, BUE and BLE strength normal and equal Skin: warm and dry  Neuro:  CNs 2-12 intact, no focal abnormalities noted Psych:  Normal affect   EKG:  The EKG was personally reviewed and demonstrates: Initial ECG is atrial fibrillation, RVR, heart rate 149, inferior and anterior ST depression Post cardioversion ECG is sinus rhythm with PVCs, heart rate 83, inferolateral ST depression is noted, similar to 5/12 ECG but more severe Telemetry:  Telemetry was personally reviewed and demonstrates: Atrial  fibrillation RVR>> sinus rhythm after cardioversion with PACs   CV studies:   ECHO: 12/10/2019 1. Normal LV systolic function; moderate LVH; grade 2 diastolic  dsyfunction with elevated LV filling pressure; s/p AVR with normal mean  gradient of 16 mmHg and no AI; AVA 1.6 cm2; MAC with mild MS (mean  gradient 5 mmHg and MVA 2 cm2).  2. Left ventricular ejection fraction, by estimation, is 60 to 65%. The  left ventricle has normal function. The left ventricle has no regional  wall motion abnormalities. There  is moderate left ventricular hypertrophy.  Left ventricular diastolic  parameters are consistent with Grade II diastolic dysfunction  (pseudonormalization). Elevated left atrial pressure.  3. Right ventricular systolic function is normal. The right ventricular  size is normal.  4. The mitral valve is abnormal. Trivial mitral valve regurgitation. Mild  mitral stenosis.  5. The aortic valve has been repaired/replaced. Aortic valve  regurgitation is not visualized. No aortic stenosis is present. There is a  Transport planner prosthetic (TAVR) valve present in the aortic position.  6. The inferior vena cava is dilated in size with >50% respiratory  variability, suggesting right atrial pressure of 8 mmHg.  CATH: 03/29/2018  Ost RCA to Prox RCA lesion is 50% stenosed.  Mid RCA lesion is 40% stenosed.  Mid RCA to Dist RCA lesion is 20% stenosed.  Ost RPDA to RPDA lesion is 40% stenosed.  Ost Cx lesion is 50% stenosed.  Prox Cx lesion is 65% stenosed.  Mid Cx lesion is 70% stenosed.  Ost 3rd Mrg lesion is 20% stenosed.  Mid LM to Dist LM lesion is 25% stenosed.  Mid LAD to Dist LAD lesion is 99% stenosed.  Ost LAD to Prox LAD lesion is 20% stenosed.  Hemodynamic findings consistent with moderate pulmonary hypertension.   1. Moderate disease in the ostium of the RCA and in the mid RCA.  2. Moderate disease in the proximal segment of the Circumflex and in the distal OM  branch.  3. Mild proximal LAD stenosis. The distal LAD tapers to a small caliber vessel and is diffusely diseased with 99% stenosis, too small for PCI.  4. Elevated filling pressures.  5. Moderately severe to Severe aortic stenosis (mean gradient 29.6 mmHg, peak to peak gradient 34 mmHg, AVA 1.11 cm2). Echo images demonstrate a heavily calcified and thickened valve with fusion of two of the leaflets. I think her aortic stenosis is in the severe range.   Recommendations: Medical management of CAD. Will continue with workup for TAVR vs AVR. I suspect that she will be a better candidate for TAVR given her co-morbidities. She may benefit from diuresis.    Laboratory Data:   Chemistry Recent Labs  Lab 01/16/20 1500  NA 138  K 4.0  CL 99  CO2 24  GLUCOSE 312*  BUN 55*  CREATININE 3.07*  CALCIUM 8.9  GFRNONAA 15*  GFRAA 17*  ANIONGAP 15    Lab Results  Component Value Date   ALT 15 12/20/2019   AST 20 12/20/2019   ALKPHOS 49 12/20/2019   BILITOT 0.3 12/20/2019   Hematology Recent Labs  Lab 01/16/20 1500  WBC 8.5  RBC 4.46  HGB 11.3*  HCT 36.3  MCV 81.4  MCH 25.3*  MCHC 31.1  RDW 15.3  PLT 238   Cardiac Enzymes High Sensitivity Troponin:   Recent Labs  Lab 01/16/20 1500  TROPONINIHS 2,473*      BNPNo results for input(s): BNP, PROBNP in the last 168 hours.  DDimer No results for input(s): DDIMER in the last 168 hours. TSH:  Lab Results  Component Value Date   TSH 0.780 02/14/2018   Lipids: Lab Results  Component Value Date   CHOL 107 10/08/2019   HDL 36 (L) 10/08/2019   LDLCALC 35 10/08/2019   LDLDIRECT 150 (H) 03/11/2019   TRIG 229 (H) 10/08/2019   CHOLHDL 3.3 07/22/2019   HgbA1c: Lab Results  Component Value Date   HGBA1C 9.9 (H) 12/20/2019   Magnesium:  Magnesium  Date Value Ref Range  Status  01/16/2020 2.1 1.7 - 2.4 mg/dL Final    Comment:    Performed at [facility], [place] [address] [place], [place] [address]      Radiology/Studies:  [facility] Chest Port 1 View  Result Date: 01/16/2020 CLINICAL DATA:  69 year old female with chest pain. EXAM: PORTABLE CHEST 1 VIEW COMPARISON:  Chest radiograph dated 12/09/2019. FINDINGS: No focal consolidation, pleural effusion or pneumothorax. Stable cardiomegaly. Aortic valve repair. Atherosclerotic calcification of the aorta. No acute osseous pathology. IMPRESSION: No acute findings.  No interval change. Electronically Signed   By: Anner Crete M.D.   On: 01/16/2020 15:46    Assessment and Plan:   1.  Non-STEMI: -She has a substrate for ischemia with distal LAD disease which has to be treated medically due to small vessel size. -Her chest pain occurred in the setting of atrial fibrillation, RVR -She had similar symptoms when she was here with rapid A. fib in April 2021, her peak troponin then was a little over 900.  At that time, she was awakened by chest pain and atrial fibrillation at 2:30 AM, but spontaneously converted to sinus rhythm by 8:14 AM. -It is certainly possible that the higher elevation in enzymes this time comes from the longer duration of the rapid A. fib. -Continue to cycle enzymes  2.  Atrial fibrillation RVR -No known cause, her volume status has been at baseline and no illness or anything else going on -Continue Eliquis, she says she has not missed any doses -Add amiodarone to her medication regimen, will start off with 400 mg twice daily -Keep appointment with Dr. Rayann Heman as scheduled if he does not see her during this hospitalization  3.  Chronic diastolic CHF: -Volume status is good by exam -Continue home diuretic regimen -Creatinine is above baseline, follow  4.  Diabetes: -Continue home NPH but make the doses 25 mg before every meal -Add sliding scale as well.  Principal Problem:   Non-STEMI (non-ST elevated myocardial infarction) ([place]) Active Problems:   CKD stage 3 due to type 2 diabetes mellitus (HCC)   Type II diabetes  mellitus with renal manifestations ([place])   Persistent atrial fibrillation with rapid ventricular response ([place])   For questions or updates, please contact [facility] Please consult [email or web address] for contact info under Cardiology/STEMI.   Signed, Rosaria Ferries, PA-C  01/16/2020 5:58 PM  History and all data above reviewed.  Patient examined.  I agree with the findings as above.  The patient has a very long medical cardiac history as outlined above.  She presents now with atrial fib with RVR.  This happened early in the morning and she developed chest pain with this but thought she could wait it out.  However she came to the ED eventually had had atrial fib with rapid rate and increased lateral ST depression.  Trop is elevated as above.  She has baseline EKG changes and had elevated trop at the last presentation although this is increased in severity.  She has small vessel and distal vessel disease.  On cath.  She has CKD.  Now DCCV back to NSR.  The patient exam reveals COR:RRR with ectopy  ,  Lungs: Clear  ,  Abd: Positive bowel sounds, no rebound no guarding, Ext No edema  .  All available labs, radiology testing, previous records reviewed. Agree with documented assessment and plan.  Atrial fib with rapid rate:  Notes reviewed including recent Atroial fib clinic.  She needs to start amiodarone while  awaiting EP consultation as an out patient for possible ablation.  She is on beta blocker and anticoagulation.  Elevated enzymes.   Again, likely demand ischemia.  She would be at high risk for renal failure with cath so we will follow enzymes and manage conservatively.  Chronic diastolic HF with wild type TTR amyloid:  Continue Tafamadis.  Jeneen Rinks Adlyn Fife  6:19 PM  01/16/2020

## 2020-01-17 ENCOUNTER — Telehealth: Payer: Self-pay

## 2020-01-17 DIAGNOSIS — I248 Other forms of acute ischemic heart disease: Secondary | ICD-10-CM | POA: Diagnosis not present

## 2020-01-17 DIAGNOSIS — I214 Non-ST elevation (NSTEMI) myocardial infarction: Secondary | ICD-10-CM | POA: Diagnosis not present

## 2020-01-17 DIAGNOSIS — Z4781 Encounter for orthopedic aftercare following surgical amputation: Secondary | ICD-10-CM | POA: Diagnosis not present

## 2020-01-17 LAB — COMPREHENSIVE METABOLIC PANEL
ALT: 21 U/L (ref 0–44)
AST: 39 U/L (ref 15–41)
Albumin: 3 g/dL — ABNORMAL LOW (ref 3.5–5.0)
Alkaline Phosphatase: 34 U/L — ABNORMAL LOW (ref 38–126)
Anion gap: 14 (ref 5–15)
BUN: 59 mg/dL — ABNORMAL HIGH (ref 8–23)
CO2: 28 mmol/L (ref 22–32)
Calcium: 8.7 mg/dL — ABNORMAL LOW (ref 8.9–10.3)
Chloride: 98 mmol/L (ref 98–111)
Creatinine, Ser: 3.08 mg/dL — ABNORMAL HIGH (ref 0.44–1.00)
GFR calc Af Amer: 17 mL/min — ABNORMAL LOW (ref >60)
GFR calc non Af Amer: 15 mL/min — ABNORMAL LOW (ref >60)
Glucose, Bld: 253 mg/dL — ABNORMAL HIGH (ref 70–99)
Potassium: 3.3 mmol/L — ABNORMAL LOW (ref 3.5–5.1)
Sodium: 140 mmol/L (ref 135–145)
Total Bilirubin: 0.8 mg/dL (ref 0.3–1.2)
Total Protein: 5.6 g/dL — ABNORMAL LOW (ref 6.5–8.1)

## 2020-01-17 LAB — GLUCOSE, CAPILLARY
Glucose-Capillary: 238 mg/dL — ABNORMAL HIGH (ref 70–99)
Glucose-Capillary: 352 mg/dL — ABNORMAL HIGH (ref 70–99)

## 2020-01-17 MED ORDER — AMIODARONE HCL 400 MG PO TABS: 400.0000 mg | ORAL_TABLET | Freq: Two times a day (BID) | ORAL | 1 refills | Status: DC

## 2020-01-17 MED ORDER — AMIODARONE HCL 200 MG PO TABS: 400.0000 mg | ORAL_TABLET | Freq: Two times a day (BID) | ORAL | Status: DC

## 2020-01-17 MED ORDER — POTASSIUM CHLORIDE CRYS ER 20 MEQ PO TBCR: 40.0000 meq | EXTENDED_RELEASE_TABLET | Freq: Once | ORAL | Status: DC

## 2020-01-17 MED FILL — AMIODARONE HCL 200 MG TAB: 200 | 30 days supply | Qty: 120 | Fill #0

## 2020-01-17 NOTE — Progress Notes (Addendum)
   01/17/20 0915  Intake (mL)  P.O. 240 mL  Percent Meals Eaten (%) 100 %  Output (mL)  Urine 100 mL  Unmeasured Output  Stool Occurrence 1  Urine Characteristics  Hygiene Peri care  Stool Characteristics  Stool Type Type 7 (Liquid consistency with no solid pieces)  Stool Descriptors Brown  Stool Amount Medium   Nurse and NT assisted pt to bedside commode. Pt denies chest pain or shortness of breath.  Pt's spouse at bedside.

## 2020-01-17 NOTE — Progress Notes (Signed)
   01/17/20 1130  Output (mL)  Urine 250 mL  Urine Characteristics  Urine Color Yellow/straw  Urine Appearance Clear

## 2020-01-17 NOTE — Progress Notes (Signed)
Transition of care pharmacy at bedside.

## 2020-01-17 NOTE — Progress Notes (Addendum)
Progress Note  Patient Name: [name] Date of Encounter: 01/17/2020  [facility] Cardiologist: Minus Breeding, MD   Subjective   She is in sinus with PACs, No further chest pain.  Inpatient Medications    Scheduled Meds: . apixaban  5 mg Oral BID  . carvedilol  25 mg Oral BID WC  . fenofibrate  54 mg Oral Daily  . hydrALAZINE  10 mg Oral BID  . icosapent Ethyl  2 g Oral BID  . insulin aspart  0-15 Units Subcutaneous TID WC  . insulin aspart  0-5 Units Subcutaneous QHS  . insulin NPH Human  25 Units Subcutaneous TID AC  . sodium chloride flush  3 mL Intravenous Q12H  . Tafamidis  61 mg Oral Daily  . torsemide  20 mg Oral 2 times per day on Mon Wed Fri  . torsemide  20 mg Oral Once per day on Sun Tue Thu Sat   Continuous Infusions: . sodium chloride     PRN Meds: sodium chloride, acetaminophen, ALPRAZolam, nitroGLYCERIN, ondansetron (ZOFRAN) IV, sodium chloride flush, zolpidem   Vital Signs    Vitals:   01/16/20 2112 01/17/20 0055 01/17/20 0315 01/17/20 0855  BP: (!) 148/68 118/61 140/62 140/70  Pulse: 78 70 70 70  Resp: 19 19 19 18   Temp: 98.1 F (36.7 C) 98.4 F (36.9 C) 98 F (36.7 C) (!) 97.5 F (36.4 C)  TempSrc: Oral Oral Oral Oral  SpO2: 96% 93% 96% 96%  Weight:   93.3 kg   Height:        Intake/Output Summary (Last 24 hours) at 01/17/2020 1019 Last data filed at 01/17/2020 0915 Gross per 24 hour  Intake 240 ml  Output 1100 ml  Net -860 ml   Last 3 Weights 01/17/2020 01/16/2020 12/25/2019  Weight (lbs) 205 lb 9.6 oz 200 lb 201 lb 3.2 oz  Weight (kg) 93.26 kg 90.719 kg 91.264 kg      Telemetry    NSR HR 60s with PACS and PVCs - Personally Reviewed  ECG   No new- Personally Reviewed  Physical Exam   GEN: No acute distress.   Neck: No JVD Cardiac: RRR, no murmurs, rubs, or gallops.  Respiratory: Clear to auscultation bilaterally. GI: Soft, nontender, non-distended  MS: No edema; No deformity. Neuro:  Nonfocal  Psych: Normal affect     Labs    High Sensitivity Troponin:   Recent Labs  Lab 01/16/20 1500 01/16/20 1802 01/16/20 1932 01/16/20 2136  TROPONINIHS 2,473* 6,777* 7,661* 9,090*      Chemistry Recent Labs  Lab 01/16/20 1500 01/17/20 0402  NA 138 140  K 4.0 3.3*  CL 99 98  CO2 24 28  GLUCOSE 312* 253*  BUN 55* 59*  CREATININE 3.07* 3.08*  CALCIUM 8.9 8.7*  PROT  --  5.6*  ALBUMIN  --  3.0*  AST  --  39  ALT  --  21  ALKPHOS  --  34*  BILITOT  --  0.8  GFRNONAA 15* 15*  GFRAA 17* 17*  ANIONGAP 15 14     Hematology Recent Labs  Lab 01/16/20 1500  WBC 8.5  RBC 4.46  HGB 11.3*  HCT 36.3  MCV 81.4  MCH 25.3*  MCHC 31.1  RDW 15.3  PLT 238    BNPNo results for input(s): BNP, PROBNP in the last 168 hours.   DDimer No results for input(s): DDIMER in the last 168 hours.   Radiology    DG Chest  Port 1 View  Result Date: 01/16/2020 CLINICAL DATA:  69 year old female with chest pain. EXAM: PORTABLE CHEST 1 VIEW COMPARISON:  Chest radiograph dated 12/09/2019. FINDINGS: No focal consolidation, pleural effusion or pneumothorax. Stable cardiomegaly. Aortic valve repair. Atherosclerotic calcification of the aorta. No acute osseous pathology. IMPRESSION: No acute findings.  No interval change. Electronically Signed   By: Anner Crete M.D.   On: 01/16/2020 15:46    Cardiac Studies   Cath in 2019  Ost RCA to Prox RCA lesion is 50% stenosed.  Mid RCA lesion is 40% stenosed.  Mid RCA to Dist RCA lesion is 20% stenosed.  Ost RPDA to RPDA lesion is 40% stenosed.  Ost Cx lesion is 50% stenosed.  Prox Cx lesion is 65% stenosed.  Mid Cx lesion is 70% stenosed.  Ost 3rd Mrg lesion is 20% stenosed.  Mid LM to Dist LM lesion is 25% stenosed.  Mid LAD to Dist LAD lesion is 99% stenosed.  Ost LAD to Prox LAD lesion is 20% stenosed.  Hemodynamic findings consistent with moderate pulmonary hypertension.   1. Moderate disease in the ostium of the RCA and in the mid RCA.  2. Moderate  disease in the proximal segment of the Circumflex and in the distal OM branch.  3. Mild proximal LAD stenosis. The distal LAD tapers to a small caliber vessel and is diffusely diseased with 99% stenosis, too small for PCI.  4. Elevated filling pressures.  5. Moderately severe to Severe aortic stenosis (mean gradient 29.6 mmHg, peak to peak gradient 34 mmHg, AVA 1.11 cm2). Echo images demonstrate a heavily calcified and thickened valve with fusion of two of the leaflets. I think her aortic stenosis is in the severe range.   Recommendations: Medical management of CAD. Will continue with workup for TAVR vs AVR. I suspect that she will be a better candidate for TAVR given her co-morbidities. She may benefit from diuresis.   Patient Profile     69 y.o. female with a hx of AS s/p TAVR, chronic diastolic CHF, PAF, CKD IIIb, HLD, HTN, OSA,TTR amyloidosis,morbid obesity, NSTEMI 2nd demand ischemia from PAF/RVR 11/2019, med  rx for distal LAD dz and mod RCA/CFX dz at cath, PAD s/p PTCA R pop & R AT 02/2019, L dSFA-TP & L TMA 09/2019, who is being seen today for the evaluation of NSTEMI.   Assessment & Plan    NSTEMI - chest pain in the setting of Afib RVR. Similar to prior CP with rapid afib peak troponin over 900 - Hs troponin this admission up to 9,000 - Cath in 2019 showed moderate disease int he ostium RCA and mid RCA, moderate disease in prox segmental Cx and distal OM branch, mild prox LAD - Given renal disease no plan for cath. Suspect demand ischemia in the setting of afib RVR  Afib RVR, cardioverted in the ED - continue ELiquis - amiodarone added - continue coreg - plan to follow-up with EP  Chronic diastolic CHF with wild type TTR amyloid - euvolemic  DM2 - SSI  For questions or updates, please contact [facility] Please consult [email or web address] for contact info under        Signed, Cadence Ninfa Meeker, PA-C  01/17/2020, 10:19 AM    History and all data above reviewed.  Patient  examined.  I agree with the findings as above.  She wants to go home and she feels OK.  No pain and no palpitations.  She has had no persistent fibrillation overnight.  She  did have a trop elevation as I might expect with her degree of CAD and her rate.    The patient exam reveals COR:RRR  ,  Lungs: Clear  ,  Abd: Positive bowel sounds, no rebound no guarding, Ext No edema  .  All available labs, radiology testing, previous records reviewed. Agree with documented assessment and plan.   Atrial fib:  Maintaining NSR.  Send home on amiodarone.  She has an appt with Dr. Rayann Heman next week to discuss further therapy.  Elevated troponin:  She had pain only during the atrial fib.  Otherwise pain free.  Enzyme elevation is consistent with the fib with rapid rate.  She would be high risk for cath and I think should be managed medically.  She has strict instructions to return to the ED with any recurrent symptoms.    Jeneen Rinks Justen Fonda  12:04 PM  01/17/2020

## 2020-01-17 NOTE — ED Provider Notes (Signed)
Physical Exam  BP 140/70 (BP Location: Right Arm)   Pulse 70   Temp (!) 97.5 F (36.4 C) (Oral)   Resp 18   Ht 5\' 7"  (1.702 m)   Wt 93.3 kg   SpO2 96%   BMI 32.20 kg/m   Physical Exam  ED Course/Procedures   Clinical Course as of Jan 16 1330  Thu Jan 16, 2020  1703 Called by lab for troponin > 2000   [WF]  1710 Discussed with cardiology who will assess pt at bedside.    [WF]    Clinical Course User Index [WF] Tedd Sias, [place]    .Critical Care Performed by: Gareth Morgan, MD Authorized by: Gareth Morgan, MD   Critical care provider statement:    Critical care time (minutes):  60   Critical care was time spent personally by me on the following activities:  Discussions with consultants, evaluation of patient's response to treatment, examination of patient, ordering and performing treatments and interventions, ordering and review of laboratory studies, ordering and review of radiographic studies, pulse oximetry, re-evaluation of patient's condition, obtaining history from patient or surrogate and review of old charts .Sedation  Date/Time: 01/17/2020 1:31 PM Performed by: Gareth Morgan, MD Authorized by: Gareth Morgan, MD   Consent:    Consent obtained:  Verbal and written   Consent given by:  Patient   Risks discussed:  Allergic reaction, dysrhythmia, inadequate sedation, nausea, prolonged hypoxia resulting in organ damage, prolonged sedation necessitating reversal, respiratory compromise necessitating ventilatory assistance and intubation and vomiting   Alternatives discussed:  Analgesia without sedation, anxiolysis and regional anesthesia Universal protocol:    Procedure explained and questions answered to patient or proxy's satisfaction: yes     Relevant documents present and verified: yes     Test results available and properly labeled: yes     Imaging studies available: yes     Required blood products, implants, devices, and special equipment  available: yes     Site/side marked: yes     Immediately prior to procedure a time out was called: yes     Patient identity confirmation method:  Verbally with patient Indications:    Procedure necessitating sedation performed by:  Physician performing sedation Pre-sedation assessment:    Time since last food or drink:  4   ASA classification: class 3 - patient with severe systemic disease     Neck mobility: normal     Mouth opening:  3 or more finger widths   Thyromental distance:  4 finger widths   Mallampati score:  II - soft palate, uvula, fauces visible   Pre-sedation assessments completed and reviewed: airway patency, cardiovascular function, hydration status, mental status, nausea/vomiting, pain level, respiratory function and temperature   Immediate pre-procedure details:    Reassessment: Patient reassessed immediately prior to procedure     Reviewed: vital signs, relevant labs/tests and NPO status     Verified: bag valve mask available, emergency equipment available, intubation equipment available, IV patency confirmed, oxygen available and suction available   Procedure details (see MAR for exact dosages):    Preoxygenation:  Nasal cannula   Sedation:  Etomidate   Intended level of sedation: deep   Intra-procedure monitoring:  Blood pressure monitoring, cardiac monitor, continuous pulse oximetry, frequent LOC assessments, frequent vital sign checks and continuous capnometry   Intra-procedure events: none     Total Provider sedation time (minutes):  20 Post-procedure details:    Attendance: Constant attendance by certified staff until patient recovered  Recovery: Patient returned to pre-procedure baseline     Post-sedation assessments completed and reviewed: airway patency, cardiovascular function, hydration status, mental status, nausea/vomiting, pain level, respiratory function and temperature     Patient is stable for discharge or admission: yes     Patient tolerance:   Tolerated well, no immediate complications Comments:     8mg  etomidate, tolerated well .Cardioversion  Date/Time: 01/17/2020 1:34 PM Performed by: Gareth Morgan, MD Authorized by: Gareth Morgan, MD   Consent:    Consent obtained:  Verbal and written   Consent given by:  Patient   Risks discussed:  Cutaneous burn, death, induced arrhythmia and pain   Alternatives discussed:  Rate-control medication Pre-procedure details:    Cardioversion basis:  Emergent   Rhythm:  Atrial fibrillation   Electrode placement:  Anterior-posterior Patient sedated: Yes. Refer to sedation procedure documentation for details of sedation.  Attempt one:    Cardioversion mode:  Synchronous   Shock (Joules):  120   Shock outcome:  Conversion to normal sinus rhythm Post-procedure details:    Patient status:  Awake   Patient tolerance of procedure:  Tolerated well, no immediate complications    MDM  17OH female with history of AS s/p TAVR, CHF, PAF on eliquis, CKD, hld, htn, OSA, obesity, NSTEMI 11/2019 who presents with chest pain, heart palpitations beginning at 3AM last night.  Discussed options with patient for cardioversion versus rate control, however given blood pressures fluctuating down to the 80s, her adherence to eliquis for the last 6wk, and symptoms beginning last night we agree that cardioversion is most appropriate.   Sedated and cardioverted successfully to sinus rhythm. Does have TW changes and troponin greater than 2000. Denies ongoing chest pain.  Consulted Cardiology who will admit her for further care.      Gareth Morgan, MD 01/17/20 1341

## 2020-01-17 NOTE — Discharge Summary (Signed)
Discharge Summary    Patient ID: [name] MRN: [id number]; DOB: 15-Oct-1950  Admit date: 01/16/2020 Discharge date: 01/17/2020  Primary Care Provider: [name], DO  Primary Cardiologist: Minus Breeding, MD  Primary Electrophysiologist:  None   Discharge Diagnoses    Principal Problem:   Persistent atrial fibrillation with rapid ventricular response ([place]) Active Problems:   CKD stage 3 due to type 2 diabetes mellitus ([place])   Type II diabetes mellitus with renal manifestations ([place])   CKD (chronic kidney disease), stage III   Non-STEMI (non-ST elevated myocardial infarction) ([place])   NSTEMI (non-ST elevated myocardial infarction) [facility])    Diagnostic Studies/Procedures    Reviewed echo 12/10/19 1. Normal LV systolic function; moderate LVH; grade 2 diastolic  dsyfunction with elevated LV filling pressure; s/p AVR with normal mean  gradient of 16 mmHg and no AI; AVA 1.6 cm2; MAC with mild MS (mean  gradient 5 mmHg and MVA 2 cm2).  2. Left ventricular ejection fraction, by estimation, is 60 to 65%. The  left ventricle has normal function. The left ventricle has no regional  wall motion abnormalities. There is moderate left ventricular hypertrophy.  Left ventricular diastolic  parameters are consistent with Grade II diastolic dysfunction  (pseudonormalization). Elevated left atrial pressure.  3. Right ventricular systolic function is normal. The right ventricular  size is normal.  4. The mitral valve is abnormal. Trivial mitral valve regurgitation. Mild  mitral stenosis.  5. The aortic valve has been repaired/replaced. Aortic valve  regurgitation is not visualized. No aortic stenosis is present. There is a  Transport planner prosthetic (TAVR) valve present in the aortic position.  6. The inferior vena cava is dilated in size with >50% respiratory  variability, suggesting right atrial pressure of 8 mmHg.   Cath in 2019  Ost RCA to Prox RCA lesion is 50%  stenosed.  Mid RCA lesion is 40% stenosed.  Mid RCA to Dist RCA lesion is 20% stenosed.  Ost RPDA to RPDA lesion is 40% stenosed.  Ost Cx lesion is 50% stenosed.  Prox Cx lesion is 65% stenosed.  Mid Cx lesion is 70% stenosed.  Ost 3rd Mrg lesion is 20% stenosed.  Mid LM to Dist LM lesion is 25% stenosed.  Mid LAD to Dist LAD lesion is 99% stenosed.  Ost LAD to Prox LAD lesion is 20% stenosed.  Hemodynamic findings consistent with moderate pulmonary hypertension.  1. Moderate disease in the ostium of the RCA and in the mid RCA.  2. Moderate disease in the proximal segment of the Circumflex and in the distal OM branch.  3. Mild proximal LAD stenosis. The distal LAD tapers to a small caliber vessel and is diffusely diseased with 99% stenosis, too small for PCI.  4. Elevated filling pressures.  5. Moderately severe to Severe aortic stenosis (mean gradient 29.6 mmHg, peak to peak gradient 34 mmHg, AVA 1.11 cm2). Echo images demonstrate a heavily calcified and thickened valve with fusion of two of the leaflets. I think her aortic stenosis is in the severe range.   Recommendations: Medical management of CAD. Will continue with workup for TAVR vs AVR. I suspect that she will be a better candidate for TAVR given her co-morbidities. She may benefit from diuresis.  _____________   History of Present Illness     [name] is a 69 y.o. female with with a hx of AS s/p TAVR, chronic diastolic CHF, PAF, CKD IIIb, HLD, HTN, OSA,TTR amyloidosis,morbid obesity, NSTEMI 2nd demand ischemia  from PAF/RVR 11/2019, med rx for distal LAD dz and mod RCA/CFX dz at cath 2019, and PADwho is being seen today for the evaluation of chest pain and NSTEMI. Patient had been previously hospitalized 12/10/2019 for chest pain and non-STEMI.  At that time cardiac enzymes were elevated to nine hundred and felt to be demand ischemia secondary to A. fib RVR.  She spontaneously converted to sinus rhythm. She was  later seen in the A. fib clinic 5/12 and appointment with Dr. Rayann Heman was scheduled.   On 01/16/2020 around 3 AM she woke up with severe palpitations and substernal chest pain up to 8/10.  Denied nausea, vomiting, diaphoresis.  Due to her elevated heart rate she took her carvedilol.  Apparently she had missed a dose the morning prior. Patient was unable to sleep due to heart rate and chest discomfort. She took her blood pressure and it was systolics in the 14N and therefore did not take hydralazine or Coreg. Heart rate was in the 140s. Chest pain later resolved however her daughter recommended she call the Afib office who recommended she to go to the ER for evaluation. Denied LLE, oethopnea, PND.   Hospital Course     Consultants: None  On arrival to the ER her chest pain had resolved but her heart rates were still elevated. She denied missing doses of Eliquis. Her blood pressure was borderline and so she ended up getting successfully cardioverted to sinus with PACs.  After being in sinus she felt this chest discomfort was much better. Cardiac enzymes came back elevated with a peak of 9,090.  This was suspected again to be demand ischemia in the setting of A. fib RVR, especially since she had a cardiac cath in May 2019 showing moderate CAD and 99% stenosis of small caliber vessel of the LAD too small for PCI. Also want to avoid catheterization given CKD stage 3. The patient was admitted and started on amiodarone. Overnight the patient remained in sinus. She had no further chest discomfort and was stable the following day. Will plan to discharge patient on amiodarone 400 mg BID. She has a follow-up scheduled for early next week with Dr. Rayann Heman.   The patient was evaluated by Dr. Percival Spanish on 01/17/20 and felt to be stable for discharge.   Did the patient have an acute coronary syndrome (MI, NSTEMI, STEMI, etc) this admission?:  No                               Did the patient have a percutaneous coronary  intervention (stent / angioplasty)?:  No.   _____________  Discharge Vitals Blood pressure 140/70, pulse 70, temperature (!) 97.5 F (36.4 C), temperature source Oral, resp. rate 18, height _0  (1.702 m), weight 93.3 kg, SpO2 96 %.  Filed Weights   01/16/20 1435 01/17/20 0315  Weight: 90.7 kg 93.3 kg    Labs & Radiologic Studies    CBC Recent Labs    01/16/20 1500  WBC 8.5  HGB 11.3*  HCT 36.3  MCV 81.4  PLT 829   Basic Metabolic Panel Recent Labs    01/16/20 1500 01/17/20 0402  NA 138 140  K 4.0 3.3*  CL 99 98  CO2 24 28  GLUCOSE 312* 253*  BUN 55* 59*  CREATININE 3.07* 3.08*  CALCIUM 8.9 8.7*  MG 2.1  --    Liver Function Tests Recent Labs    01/17/20 0402  AST 39  ALT 21  ALKPHOS 34*  BILITOT 0.8  PROT 5.6*  ALBUMIN 3.0*   No results for input(s): LIPASE, AMYLASE in the last 72 hours. High Sensitivity Troponin:   Recent Labs  Lab 01/16/20 1500 01/16/20 1802 01/16/20 1932 01/16/20 2136  TROPONINIHS 2,473* 6,777* 7,661* 9,090*    BNP Invalid input(s): POCBNP D-Dimer No results for input(s): DDIMER in the last 72 hours. Hemoglobin A1C Recent Labs    01/16/20 1932  HGBA1C 10.2*   Fasting Lipid Panel No results for input(s): CHOL, HDL, LDLCALC, TRIG, CHOLHDL, LDLDIRECT in the last 72 hours. Thyroid Function Tests No results for input(s): TSH, T4TOTAL, T3FREE, THYROIDAB in the last 72 hours.  Invalid input(s): FREET3 _____________  Darletta Moll Chest Port 1 View  Result Date: 01/16/2020 CLINICAL DATA:  69 year old female with chest pain. EXAM: PORTABLE CHEST 1 VIEW COMPARISON:  Chest radiograph dated 12/09/2019. FINDINGS: No focal consolidation, pleural effusion or pneumothorax. Stable cardiomegaly. Aortic valve repair. Atherosclerotic calcification of the aorta. No acute osseous pathology. IMPRESSION: No acute findings.  No interval change. Electronically Signed   By: Anner Crete M.D.   On: 01/16/2020 15:46   Disposition   Pt is being  discharged home today in good condition.  Follow-up Plans & Appointments    Follow-up Information    Thompson Grayer, MD Follow up on 01/20/2020.   Specialty: Cardiology Contact information: [address] [place] [address] [phone]          Discharge Instructions    (HEART FAILURE PATIENTS) Call MD:  Anytime you have any of the following symptoms: 1) 3 pound weight gain in 24 hours or 5 pounds in 1 week 2) shortness of breath, with or without a dry hacking cough 3) swelling in the hands, feet or stomach 4) if you have to sleep on extra pillows at night in order to breathe.   Complete by: As directed    Diet - low sodium heart healthy   Complete by: As directed    Increase activity slowly   Complete by: As directed       Discharge Medications   Allergies as of 01/17/2020      Reactions   Amlodipine Swelling   Ankle swelling   Atorvastatin Other (See Comments)   Myalgias   Crestor [rosuvastatin Calcium] Other (See Comments)   Stiffness and back pain   Ezetimibe-simvastatin Other (See Comments)   Leg cramps   Sitagliptin Other (See Comments)   Statins Other (See Comments)   Myalgias   Celebrex [celecoxib] Rash   Levemir [insulin Detemir] Rash      Medication List    TAKE these medications   Accu-Chek Aviva Plus test strip Generic drug: glucose blood USE TO CHECK BLOOD SUGAR UP TO TWICE DAILY OR AS INSTRUCTED   acetaminophen 500 MG tablet Commonly known as: TYLENOL Take 1,000 mg by mouth as needed for mild pain.   amiodarone 400 MG tablet Commonly known as: PACERONE Take 1 tablet (400 mg total) by mouth 2 (two) times daily.   apixaban 5 MG Tabs tablet Commonly known as: Eliquis Take 1 tablet (5 mg total) by mouth 2 (two) times daily.   B-D INS SYR ULTRAFINE 1CC/31G 31G X 5/16" 1 ML Misc Generic drug: Insulin Syringe-Needle U-100 USE TO INJECT INSULIN TWICE A DAY AS INSTRUCTED What changed: See the new instructions.   ReliOn Insulin  Syringe 31G X 15/64" 1 ML Misc Generic drug: Insulin Syringe-Needle U-100 Inject 1 Syringe into the  skin 3 (three) times daily. What changed: Another medication with the same name was changed. Make sure you understand how and when to take each.   CALCIUM 600 PO Take 1 tablet by mouth daily.   carvedilol 25 MG tablet Commonly known as: COREG Take 25 mg by mouth 2 (two) times daily with a meal.   fenofibrate 145 MG tablet Commonly known as: TRICOR TAKE 1 TABLET BY MOUTH EVERY DAY   hydrALAZINE 10 MG tablet Commonly known as: APRESOLINE Take 10 mg by mouth 2 (two) times daily.   insulin NPH Human 100 UNIT/ML injection Commonly known as: NOVOLIN N Inject 25-50 Units into the skin See admin instructions. 50 units in the am 60 units at lunch and 20 units for supper - on a sliding scale   Insulin Pen Needle 32G X 4 MM Misc Use to inject insulin with insulin pen   Livalo 4 MG Tabs Generic drug: Pitavastatin Calcium Take 4 mg by mouth every evening.   meclizine 12.5 MG tablet Commonly known as: ANTIVERT Take 1 tablet (12.5 mg total) by mouth 3 (three) times daily as needed for dizziness.   MULTIVITAMIN GUMMIES ADULT PO Taking two gummies by mouth daily   nutrition supplement (JUVEN) Pack Take 1 packet by mouth 2 (two) times daily between meals.   ondansetron 4 MG disintegrating tablet Commonly known as: Zofran ODT Take 1 tablet (4 mg total) by mouth every 8 (eight) hours as needed for nausea. What changed: when to take this   Praluent 75 MG/ML Soaj Generic drug: Alirocumab INJECT 75 MG INTO THE SKIN EVERY 14 (FOURTEEN) DAYS.   torsemide 20 MG tablet Commonly known as: DEMADEX Take 20 mg twice a day and alternate with 20 mg daily the next   Vascepa 1 g capsule Generic drug: icosapent Ethyl TAKE 2 CAPSULES (2 G TOTAL) BY MOUTH 2 (TWO) TIMES A DAY. What changed: See the new instructions.   VITAMIN B-12 PO Take by mouth. 2 gummies by mouth daily   VITAMIN D3 PO 2  gummies by mouth daily   Vyndamax 61 MG Caps Generic drug: Tafamidis Take 61 mg by mouth daily.          Outstanding Labs/Studies   None  Duration of Discharge Encounter   Greater than 30 minutes including physician time.  Signed, Matheus Spiker Ninfa Meeker, PA-C 01/17/2020, 1:35 PM

## 2020-01-17 NOTE — Discharge Instructions (Signed)

## 2020-01-17 NOTE — Telephone Encounter (Signed)
Called pt to confirm her virtual appt on 01/20/20. I was not able to contact the pt or leave a message due to no VM set up.

## 2020-01-20 ENCOUNTER — Other Ambulatory Visit: Payer: Self-pay

## 2020-01-20 ENCOUNTER — Telehealth: Payer: Self-pay | Admitting: Family Medicine

## 2020-01-20 ENCOUNTER — Telehealth (INDEPENDENT_AMBULATORY_CARE_PROVIDER_SITE_OTHER): Payer: Medicare Other | Admitting: Internal Medicine

## 2020-01-20 DIAGNOSIS — I4819 Other persistent atrial fibrillation: Secondary | ICD-10-CM | POA: Diagnosis not present

## 2020-01-20 DIAGNOSIS — I10 Essential (primary) hypertension: Secondary | ICD-10-CM

## 2020-01-20 DIAGNOSIS — D6869 Other thrombophilia: Secondary | ICD-10-CM | POA: Diagnosis not present

## 2020-01-20 NOTE — Progress Notes (Signed)
Electrophysiology TeleHealth Note   Due to national recommendations of social distancing due to [place] 19, Audio telehealth visit is felt to be most appropriate for this patient at this time.  See MyChart message from today for patient consent regarding telehealth for [name].   Date:  01/20/2020   ID:  [name], DOB Mar 07, 1951, MRN [id number]  Location: home Provider location: [place]  Evaluation Performed: New patient consult  PCP:  [name], DO  Cardiologist:  Minus Breeding, MD/ Aundra Dubin Electrophysiologist:  None   Chief Complaint:  afib  History of Present Illness:    [name] is a 69 y.o. female who presents via audio/video conferencing for a telehealth visit today.   The patient is referred for new consultation regarding afib by Dr Percival Spanish. The patient has a h/o persistent afib with prior cardioversion.   She has obesity, AS s/p prior TAVR and TTR amyloidosis. She has uncontrolled DM (A1C 9.9) and advanced renal disease.   She is symptomatic with her afib and was recently hospitalized for afib.   She was started on amiodarone and has done well since that time. Today, she denies symptoms of palpitations, chest pain, shortness of breath, dizziness, presyncope, syncope, bleeding, or neurologic sequela. The patient is tolerating medications without difficulties and is otherwise without complaint today.     Past Medical History:  Diagnosis Date  . Atrial fibrillation ([place])    a. maintaining sinus s/p DCCV, on Eliquis  . CAD (coronary artery disease)    a. Nonobstructive by cath 2006 - 25% LAD, 25-30% after 1st diag, distal 25% PDA, minor irreg LCx. b. Normal nuc 2011.  . CKD (chronic kidney disease), stage III   . Cystocele   . Difficult intubation 09-07-2012   big neck trouble with intubation parotid surgery"takes little med to sedate"  . Esophageal stricture   . GERD (gastroesophageal reflux disease)   . Hiatal hernia    a. 07/2013:  HH with small stricture holding up barium tablet (concides with patient's sx of food sticking).  . History of kidney stones   . Hyperlipidemia   . Hypertension   . IBS (irritable bowel syndrome)   . Memory difficulty 11/26/2015  . Morbid obesity ([place])    a. hx of gastric bypass (sleeve gastrectomy 2015)  . Non-STEMI (non-ST elevated myocardial infarction) ([place]) 01/16/2020  . Osteoporosis   . 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  Marland Kitchen Pericardial effusion    a. Mod by echo 2012 at time of PNA. b. Echo 07/2012: small pericardial effusion vs fat.  . Pressure ulcer 04/16/2019   right foot  . S/P TAVR (transcatheter aortic valve replacement)   . Severe aortic stenosis   . Sleep apnea     "have mask; don't use it" (02/14/2018)  . TIA (transient ischemic attack) 10/2014; 06/2016   "some memory issues since; daughter says my speech is sometimes different" (02/14/2018)  . Type II diabetes mellitus ([place])     Past Surgical History:  Procedure Laterality Date  . BREATH TEK H PYLORI N/A 07/29/2013   Procedure: [place];  Surgeon: Shann Medal, MD;  Location: [facility];  Service: General;  Laterality: N/A;  . CARDIAC CATHETERIZATION  2006  . CARDIOVERSION N/A 02/16/2018   Procedure: CARDIOVERSION;  Surgeon: Pixie Casino, MD;  Location: [place];  Service: Cardiovascular;  Laterality: N/A;  . CARPAL TUNNEL RELEASE Left 2011  . CATARACT EXTRACTION W/ INTRAOCULAR  LENS  IMPLANT, BILATERAL Bilateral 2003   left  . CHOLECYSTECTOMY OPEN  1982  . COLONOSCOPY N/A 07/14/2015   Procedure: COLONOSCOPY;  Surgeon: Ladene Artist, MD;  Location: [facility];  Service: Endoscopy;  Laterality: N/A;  . ESOPHAGOGASTRODUODENOSCOPY N/A 12/27/2013   Procedure: ESOPHAGOGASTRODUODENOSCOPY (EGD);  Surgeon: Shann Medal, MD;  Location: [facility];  Service: General;  Laterality: N/A;  . ESOPHAGOGASTRODUODENOSCOPY (EGD) WITH ESOPHAGEAL DILATION    . INTRAOPERATIVE TRANSTHORACIC  ECHOCARDIOGRAM  05/08/2018   Procedure: INTRAOPERATIVE TRANSTHORACIC ECHOCARDIOGRAM;  Surgeon: Burnell Blanks, MD;  Location: [place];  Service: Open Heart Surgery;;  . IR FLUORO GUIDE CV LINE RIGHT  04/25/2019  . IR REMOVAL TUN CV CATH W/O FL  06/20/2019  . IR US GUIDE VASC ACCESS RIGHT  04/25/2019  . LAPAROSCOPIC GASTRIC SLEEVE RESECTION N/A 01/20/2014   Procedure: LAPAROSCOPIC GASTRIC SLEEVE RESECTION;  Surgeon: Shann Medal, MD;  Location: [facility];  Service: General;  Laterality: N/A;  . PAROTIDECTOMY  09/07/2012   Procedure: PAROTIDECTOMY;  Surgeon: Melida Quitter, MD;  Location: [place];  Service: ENT;  Laterality: Left;  LEFT PAROTIDECTOMY  . RIGHT HEART CATH N/A 09/25/2018   Procedure: RIGHT HEART CATH;  Surgeon: Larey Dresser, MD;  Location: [facility];  Service: Cardiovascular;  Laterality: N/A;  . RIGHT/LEFT HEART CATH AND CORONARY ANGIOGRAPHY N/A 03/29/2018   Procedure: RIGHT/LEFT HEART CATH AND CORONARY ANGIOGRAPHY;  Surgeon: Burnell Blanks, MD;  Location: [facility];  Service: Cardiovascular;  Laterality: N/A;  . TEE WITHOUT CARDIOVERSION N/A 02/16/2018   Procedure: TRANSESOPHAGEAL ECHOCARDIOGRAM (TEE);  Surgeon: Pixie Casino, MD;  Location: [place];  Service: Cardiovascular;  Laterality: N/A;  . TEE WITHOUT CARDIOVERSION N/A 06/05/2019   Procedure: TRANSESOPHAGEAL ECHOCARDIOGRAM (TEE);  Surgeon: Sanda Klein, MD;  Location: [place];  Service: Cardiovascular;  Laterality: N/A;  . TEE WITHOUT CARDIOVERSION N/A 04/24/2019   Procedure: TRANSESOPHAGEAL ECHOCARDIOGRAM (TEE);  Surgeon: Sanda Klein, MD;  Location: [place];  Service: Cardiovascular;  Laterality: N/A;  . TOE AMPUTATION Left 07/2017   great toe; [facility]   . TONSILLECTOMY  1968  . TRANSCATHETER AORTIC VALVE REPLACEMENT, TRANSFEMORAL  05/08/2018   Transcatheter Aortic Valve Replacement   . TRANSCATHETER AORTIC VALVE REPLACEMENT, TRANSFEMORAL N/A 05/08/2018   Procedure:  TRANSCATHETER AORTIC VALVE REPLACEMENT, TRANSFEMORAL;  Surgeon: Burnell Blanks, MD;  Location: [place];  Service: Open Heart Surgery;  Laterality: N/A;  . TUBAL LIGATION  yrs ago  . UPPER GI ENDOSCOPY  01/20/2014   Procedure: UPPER GI ENDOSCOPY;  Surgeon: Shann Medal, MD;  Location: [facility];  Service: General;;    Current Outpatient Medications  Medication Sig Dispense Refill  . ACCU-CHEK AVIVA PLUS test strip USE TO CHECK BLOOD SUGAR UP TO TWICE DAILY OR AS INSTRUCTED 100 each 2  . acetaminophen (TYLENOL) 500 MG tablet Take 1,000 mg by mouth as needed for mild pain.     Marland Kitchen amiodarone (PACERONE) 400 MG tablet Take 1 tablet (400 mg total) by mouth 2 (two) times daily. 30 tablet 1  . apixaban (ELIQUIS) 5 MG TABS tablet Take 1 tablet (5 mg total) by mouth 2 (two) times daily. 180 tablet 1  . B-D INS SYR ULTRAFINE 1CC/31G 31G X 5/16" 1 ML MISC USE TO INJECT INSULIN TWICE A DAY AS INSTRUCTED (Patient taking differently: 1 Stick by Other route 3 (three) times daily before meals. ) 100 each 2  . Calcium Carbonate (CALCIUM 600 PO) Take 1 tablet by mouth daily.    Marland Kitchen  carvedilol (COREG) 25 MG tablet Take 25 mg by mouth 2 (two) times daily with a meal.    . Cholecalciferol (VITAMIN D3 PO) 2 gummies by mouth daily    . Cyanocobalamin (VITAMIN B-12 PO) Take by mouth. 2 gummies by mouth daily    . fenofibrate (TRICOR) 145 MG tablet TAKE 1 TABLET BY MOUTH EVERY DAY (Patient taking differently: Take 145 mg by mouth daily. ) 90 tablet 3  . hydrALAZINE (APRESOLINE) 10 MG tablet Take 10 mg by mouth 2 (two) times daily.     . insulin NPH Human (NOVOLIN N) 100 UNIT/ML injection Inject 25-50 Units into the skin See admin instructions. 50 units in the am 60 units at lunch and 20 units for supper - on a sliding scale    . Insulin Pen Needle 32G X 4 MM MISC Use to inject insulin with insulin pen 100 each 4  . LIVALO 4 MG TABS Take 4 mg by mouth every evening.    . meclizine (ANTIVERT) 12.5 MG tablet Take 1 tablet  (12.5 mg total) by mouth 3 (three) times daily as needed for dizziness. 30 tablet 5  . Multiple Vitamins-Minerals (MULTIVITAMIN GUMMIES ADULT PO) Taking two gummies by mouth daily    . nutrition supplement, JUVEN, (JUVEN) PACK Take 1 packet by mouth 2 (two) times daily between meals. 60 packet 0  . ondansetron (ZOFRAN ODT) 4 MG disintegrating tablet Take 1 tablet (4 mg total) by mouth every 8 (eight) hours as needed for nausea. (Patient taking differently: Take 4 mg by mouth as needed for nausea. ) 10 tablet 0  . PRALUENT 75 MG/ML SOAJ INJECT 75 MG INTO THE SKIN EVERY 14 (FOURTEEN) DAYS. 2 pen 6  . RELION INSULIN SYRINGE 31G X 15/64" 1 ML MISC Inject 1 Syringe into the skin 3 (three) times daily.    . Tafamidis (VYNDAMAX) 61 MG CAPS Take 61 mg by mouth daily. 30 capsule 11  . torsemide (DEMADEX) 20 MG tablet Take 20 mg twice a day and alternate with 20 mg daily the next 30 tablet 5  . VASCEPA 1 g capsule TAKE 2 CAPSULES (2 G TOTAL) BY MOUTH 2 (TWO) TIMES A DAY. 120 capsule 6   No current facility-administered medications for this visit.    Allergies:   Amlodipine, Atorvastatin, Crestor [rosuvastatin calcium], Ezetimibe-simvastatin, Sitagliptin, Statins, Celebrex [celecoxib], and Levemir [insulin detemir]   Social History:  The patient  reports that she has never smoked. She has never used smokeless tobacco. She reports current alcohol use. She reports that she does not use drugs.   Family History:  The patient's  family history includes Colitis in her mother; Colon polyps in her father; Diabetes in her father, maternal grandfather, mother, and another family member; Heart attack in her sister; Heart disease in her father; Heart failure in her father and mother; Hypertension in her mother; Kidney Stones in her mother; Skin cancer in her mother; Stroke in her maternal grandfather and mother.    ROS:  Please see the history of present illness.   All other systems are personally reviewed and  negative.    Exam:    Vital Signs:  There were no vitals taken for this visit.   Well sounding, alert and conversant   Labs/Other Tests and Data Reviewed:    Recent Labs: 12/09/2019: B Natriuretic Peptide 161.9 01/16/2020: Hemoglobin 11.3; Magnesium 2.1; Platelets 238 01/17/2020: ALT 21; BUN 59; Creatinine, Ser 3.08; Potassium 3.3; Sodium 140   Wt Readings from Last 3  Encounters:  01/17/20 205 lb 9.6 oz (93.3 kg)  12/25/19 201 lb 3.2 oz (91.3 kg)  12/23/19 202 lb (91.6 kg)     Other studies personally reviewed: Additional studies/ records that were reviewed today include: prior echo, AF clinic visit, recent hospital records, ekgs  Review of the above records today demonstrates: as above   ASSESSMENT & PLAN:    1.  Persistent afib The patient has symptomatic, recurrent persistent atrial fibrillation. She has been started on amiodarone and is doing well  Chads2vasc score is at least 6.  she is anticoagulated with eliquis.  We will need to be very careful with [facility] therapy given her renal failure. Therapeutic strategies for afib including medicine and ablation were discussed in detail with the patient today. Given her poorly controlled diabetes, obesity, and TTR amyloidosis, I would anticipate that her success with ablation would be quite low.  I would advise that we continue amiodarone at this time.  2. HTN Stable No change required today  Follow-up:  AF clinic in 2  Months She will require close follow-up ongoing in the AF clinic to avoid toxicity with amiodarone therapy.  She will also continue to follow with Drs Percival Spanish and Aundra Dubin. I will see when needed.  Patient Risk:  after full review of this patients clinical status, I feel that they are at high risk at this time.   Today, I have spent 20 minutes with the patient with telehealth technology discussing afib .    SignedThompson Grayer MD, [facility] 01/20/2020 12:25 PM   [address] [place] [address] [phone] (office) [phone] (fax)

## 2020-01-21 NOTE — Telephone Encounter (Signed)
Patient to be seen within one week of hospital discharge.  Appt made.  Patient aware

## 2020-01-24 ENCOUNTER — Ambulatory Visit (INDEPENDENT_AMBULATORY_CARE_PROVIDER_SITE_OTHER): Payer: Medicare Other | Admitting: Nurse Practitioner

## 2020-01-24 ENCOUNTER — Other Ambulatory Visit: Payer: Self-pay

## 2020-01-24 ENCOUNTER — Encounter: Payer: Self-pay | Admitting: Nurse Practitioner

## 2020-01-24 VITALS — BP 130/58 | HR 53 | Temp 97.3°F | Resp 22 | Ht 67.0 in | Wt 208.6 lb

## 2020-01-24 DIAGNOSIS — I214 Non-ST elevation (NSTEMI) myocardial infarction: Secondary | ICD-10-CM

## 2020-01-24 DIAGNOSIS — Z09 Encounter for follow-up examination after completed treatment for conditions other than malignant neoplasm: Secondary | ICD-10-CM | POA: Insufficient documentation

## 2020-01-24 DIAGNOSIS — I4819 Other persistent atrial fibrillation: Secondary | ICD-10-CM

## 2020-01-24 NOTE — Patient Instructions (Addendum)
Hospital discharge follow-up Patient is a 69 year old female who presents to clinic after hospitalization for heart attack, A. fib.  Patient reports weakness but denies any fever, nausea, and chest pain.  Patient is not reporting any signs or symptoms of heart attack today.  Hospital discharge instructions completed patient verbalized understanding.  Printed handout given to patient. Patient knows to  follow-up with any signs or symptoms of heart attack.    Atrial Fibrillation  Atrial fibrillation is a type of heartbeat that is irregular or fast. If you have this condition, your heart beats without any order. This makes it hard for your heart to pump blood in a normal way. Atrial fibrillation may come and go, or it may become a long-lasting problem. If this condition is not treated, it can put you at higher risk for stroke, heart failure, and other heart problems. What are the causes? This condition may be caused by diseases that damage the heart. They include:  High blood pressure.  Heart failure.  Heart valve disease.  Heart surgery. Other causes include:  Diabetes.  Thyroid disease.  Being overweight.  Kidney disease. Sometimes the cause is not known. What increases the risk? You are more likely to develop this condition if:  You are older.  You smoke.  You exercise often and very hard.  You have a family history of this condition.  You are a man.  You use drugs.  You drink a lot of alcohol.  You have lung conditions, such as emphysema, pneumonia, or COPD.  You have sleep apnea. What are the signs or symptoms? Common symptoms of this condition include:  A feeling that your heart is beating very fast.  Chest pain or discomfort.  Feeling short of breath.  Suddenly feeling light-headed or weak.  Getting tired easily during activity.  Fainting.  Sweating. In some cases, there are no symptoms. How is this treated? Treatment for this condition depends  on underlying conditions and how you feel when you have atrial fibrillation. They include:  Medicines to: ? Prevent blood clots. ? Treat heart rate or heart rhythm problems.  Using devices, such as a pacemaker, to correct heart rhythm problems.  Doing surgery to remove the part of the heart that sends bad signals.  Closing an area where clots can form in the heart (left atrial appendage). In some cases, your doctor will treat other underlying conditions. Follow these instructions at home: Medicines  Take over-the-counter and prescription medicines only as told by your doctor.  Do not take any new medicines without first talking to your doctor.  If you are taking blood thinners: ? Talk with your doctor before you take any medicines that have aspirin or NSAIDs, such as ibuprofen, in them. ? Take your medicine exactly as told by your doctor. Take it at the same time each day. ? Avoid activities that could hurt or bruise you. Follow instructions about how to prevent falls. ? Wear a bracelet that says you are taking blood thinners. Or, carry a card that lists what medicines you take. Lifestyle      Do not use any products that have nicotine or tobacco in them. These include cigarettes, e-cigarettes, and chewing tobacco. If you need help quitting, ask your doctor.  Eat heart-healthy foods. Talk with your doctor about the right eating plan for you.  Exercise regularly as told by your doctor.  Do not drink alcohol.  Lose weight if you are overweight.  Do not use drugs, including cannabis. General  instructions  If you have a condition that causes breathing to stop for a short period of time (apnea), treat it as told by your doctor.  Keep a healthy weight. Do not use diet pills unless your doctor says they are safe for you. Diet pills may make heart problems worse.  Keep all follow-up visits as told by your doctor. This is important. Contact a doctor if:  You notice a change in  the speed, rhythm, or strength of your heartbeat.  You are taking a blood-thinning medicine and you get more bruising.  You get tired more easily when you move or exercise.  You have a sudden change in weight. Get help right away if:   You have pain in your chest or your belly (abdomen).  You have trouble breathing.  You have side effects of blood thinners, such as blood in your vomit, poop (stool), or pee (urine), or bleeding that cannot stop.  You have any signs of a stroke. "BE FAST" is an easy way to remember the main warning signs: ? B - Balance. Signs are dizziness, sudden trouble walking, or loss of balance. ? E - Eyes. Signs are trouble seeing or a change in how you see. ? F - Face. Signs are sudden weakness or loss of feeling in the face, or the face or eyelid drooping on one side. ? A - Arms. Signs are weakness or loss of feeling in an arm. This happens suddenly and usually on one side of the body. ? S - Speech. Signs are sudden trouble speaking, slurred speech, or trouble understanding what people say. ? T - Time. Time to call emergency services. Write down what time symptoms started.  You have other signs of a stroke, such as: ? A sudden, very bad headache with no known cause. ? Feeling like you may vomit (nausea). ? Vomiting. ? A seizure. These symptoms may be an emergency. Do not wait to see if the symptoms will go away. Get medical help right away. Call your local emergency services (911 in the [place] Do not drive yourself to the hospital. Summary  Atrial fibrillation is a type of heartbeat that is irregular or fast.  You are at higher risk of this condition if you smoke, are older, have diabetes, or are overweight.  Follow your doctor's instructions about medicines, diet, exercise, and follow-up visits.  Get help right away if you have signs or symptoms of a stroke.  Get help right away if you cannot catch your breath, or you have chest pain or  discomfort. This information is not intended to replace advice given to you by your health care provider. Make sure you discuss any questions you have with your health care provider. Document Revised: 01/23/2019 Document Reviewed: 01/23/2019 Elsevier Patient Education  [place].

## 2020-01-24 NOTE — Progress Notes (Signed)
Established Patient Office Visit  Subjective:  Patient ID: [name], female    DOB: 1950-10-23  Age: 69 y.o. MRN: [id number]  CC:  Chief Complaint  Patient presents with  . Hospitalization Follow-up    Heart Attack, AFIB     HPI [name] presents for follow up after hospitalization for heart attack , AFIB. On 01/16/2020. Patient reports weakness today but denies any fever, nausea, and chest pain. patient is not reporting any signs or symptoms of heart attack. Compliant with all medication. Hospital discharge instructions completed.   Past Medical History:  Diagnosis Date  . Atrial fibrillation ([place])    a. maintaining sinus s/p DCCV, on Eliquis  . CAD (coronary artery disease)    a. Nonobstructive by cath 2006 - 25% LAD, 25-30% after 1st diag, distal 25% PDA, minor irreg LCx. b. Normal nuc 2011.  . CKD (chronic kidney disease), stage III   . Cystocele   . Difficult intubation 09-07-2012   big neck trouble with intubation parotid surgery"takes little med to sedate"  . Esophageal stricture   . GERD (gastroesophageal reflux disease)   . Hiatal hernia    a. 07/2013: HH with small stricture holding up barium tablet (concides with patient's sx of food sticking).  . History of kidney stones   . Hyperlipidemia   . Hypertension   . IBS (irritable bowel syndrome)   . Memory difficulty 11/26/2015  . Morbid obesity ([place])    a. hx of gastric bypass (sleeve gastrectomy 2015)  . Non-STEMI (non-ST elevated myocardial infarction) ([place]) 01/16/2020  . Osteoporosis   . 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  Marland Kitchen Pericardial effusion    a. Mod by echo 2012 at time of PNA. b. Echo 07/2012: small pericardial effusion vs fat.  . Pressure ulcer 04/16/2019   right foot  . S/P TAVR (transcatheter aortic valve replacement)   . Severe aortic stenosis   . Sleep apnea     "have mask; don't use it" (02/14/2018)  . TIA (transient ischemic attack) 10/2014; 06/2016   "some  memory issues since; daughter says my speech is sometimes different" (02/14/2018)  . Type II diabetes mellitus ([place])     Past Surgical History:  Procedure Laterality Date  . BREATH TEK H PYLORI N/A 07/29/2013   Procedure: [place];  Surgeon: Shann Medal, MD;  Location: [facility];  Service: General;  Laterality: N/A;  . CARDIAC CATHETERIZATION  2006  . CARDIOVERSION N/A 02/16/2018   Procedure: CARDIOVERSION;  Surgeon: Pixie Casino, MD;  Location: [facility];  Service: Cardiovascular;  Laterality: N/A;  . CARPAL TUNNEL RELEASE Left 2011  . CATARACT EXTRACTION W/ INTRAOCULAR LENS  IMPLANT, BILATERAL Bilateral 2003   left  . CHOLECYSTECTOMY OPEN  1982  . COLONOSCOPY N/A 07/14/2015   Procedure: COLONOSCOPY;  Surgeon: Ladene Artist, MD;  Location: [facility];  Service: Endoscopy;  Laterality: N/A;  . ESOPHAGOGASTRODUODENOSCOPY N/A 12/27/2013   Procedure: ESOPHAGOGASTRODUODENOSCOPY (EGD);  Surgeon: Shann Medal, MD;  Location: [facility];  Service: General;  Laterality: N/A;  . ESOPHAGOGASTRODUODENOSCOPY (EGD) WITH ESOPHAGEAL DILATION    . INTRAOPERATIVE TRANSTHORACIC ECHOCARDIOGRAM  05/08/2018   Procedure: INTRAOPERATIVE TRANSTHORACIC ECHOCARDIOGRAM;  Surgeon: Burnell Blanks, MD;  Location: [facility];  Service: Open Heart Surgery;;  . IR FLUORO GUIDE CV LINE RIGHT  04/25/2019  . IR REMOVAL TUN CV CATH W/O FL  06/20/2019  . IR US GUIDE VASC ACCESS RIGHT  04/25/2019  .  LAPAROSCOPIC GASTRIC SLEEVE RESECTION N/A 01/20/2014   Procedure: LAPAROSCOPIC GASTRIC SLEEVE RESECTION;  Surgeon: Shann Medal, MD;  Location: [facility];  Service: General;  Laterality: N/A;  . PAROTIDECTOMY  09/07/2012   Procedure: PAROTIDECTOMY;  Surgeon: Melida Quitter, MD;  Location: [facility];  Service: ENT;  Laterality: Left;  LEFT PAROTIDECTOMY  . RIGHT HEART CATH N/A 09/25/2018   Procedure: RIGHT HEART CATH;  Surgeon: Larey Dresser, MD;  Location: [facility];  Service: Cardiovascular;   Laterality: N/A;  . RIGHT/LEFT HEART CATH AND CORONARY ANGIOGRAPHY N/A 03/29/2018   Procedure: RIGHT/LEFT HEART CATH AND CORONARY ANGIOGRAPHY;  Surgeon: Burnell Blanks, MD;  Location: [facility];  Service: Cardiovascular;  Laterality: N/A;  . TEE WITHOUT CARDIOVERSION N/A 02/16/2018   Procedure: TRANSESOPHAGEAL ECHOCARDIOGRAM (TEE);  Surgeon: Pixie Casino, MD;  Location: [facility];  Service: Cardiovascular;  Laterality: N/A;  . TEE WITHOUT CARDIOVERSION N/A 06/05/2019   Procedure: TRANSESOPHAGEAL ECHOCARDIOGRAM (TEE);  Surgeon: Sanda Klein, MD;  Location: [facility];  Service: Cardiovascular;  Laterality: N/A;  . TEE WITHOUT CARDIOVERSION N/A 04/24/2019   Procedure: TRANSESOPHAGEAL ECHOCARDIOGRAM (TEE);  Surgeon: Sanda Klein, MD;  Location: [facility];  Service: Cardiovascular;  Laterality: N/A;  . TOE AMPUTATION Left 07/2017   great toe; [facility]   . TONSILLECTOMY  1968  . TRANSCATHETER AORTIC VALVE REPLACEMENT, TRANSFEMORAL  05/08/2018   Transcatheter Aortic Valve Replacement   . TRANSCATHETER AORTIC VALVE REPLACEMENT, TRANSFEMORAL N/A 05/08/2018   Procedure: TRANSCATHETER AORTIC VALVE REPLACEMENT, TRANSFEMORAL;  Surgeon: Burnell Blanks, MD;  Location: [facility];  Service: Open Heart Surgery;  Laterality: N/A;  . TUBAL LIGATION  yrs ago  . UPPER GI ENDOSCOPY  01/20/2014   Procedure: UPPER GI ENDOSCOPY;  Surgeon: Shann Medal, MD;  Location: [facility];  Service: General;;    Family History  Problem Relation Age of Onset  . Heart failure Mother   . Hypertension Mother   . Skin cancer Mother   . Colitis Mother   . Diabetes Mother   . Kidney Stones Mother   . Stroke Mother   . Heart disease Father   . Colon polyps Father   . Diabetes Father   . Heart failure Father   . Heart attack Sister   . Diabetes Other   . Stroke Maternal Grandfather   . Diabetes Maternal Grandfather   . Colon cancer Neg Hx     Social History   Socioeconomic History  .  Marital status: Married    Spouse name: Not on file  . Number of children: 3  . Years of education: some college  . Highest education level: Some college, no degree  Occupational History  . Occupation: retired    Fish farm manager: [employer]  Tobacco Use  . Smoking status: Never Smoker  . Smokeless tobacco: Never Used  Vaping Use  . Vaping Use: Never used  Substance and Sexual Activity  . Alcohol use: Yes    Alcohol/week: 0.0 standard drinks    Comment:  "mixed drink q few years"  . Drug use: Never  . Sexual activity: Yes    Birth control/protection: Post-menopausal  Other Topics Concern  . Not on file  Social History Narrative   Married   Patient is right handed.   Patient rarely drinks caffeine.   Lives at home with husband in two story home. Husband washes laundry in the basement. They have 3 daughters and 7 grandchildren.    Social Determinants of Health   Financial  Resource Strain:   . Difficulty of Paying Living Expenses:   Food Insecurity:   . Worried About Charity fundraiser in the Last Year:   . Arboriculturist in the Last Year:   Transportation Needs:   . Film/video editor (Medical):   Marland Kitchen Lack of Transportation (Non-Medical):   Physical Activity:   . Days of Exercise per Week:   . Minutes of Exercise per Session:   Stress:   . Feeling of Stress :   Social Connections:   . Frequency of Communication with Friends and Family:   . Frequency of Social Gatherings with Friends and Family:   . Attends Religious Services:   . Active Member of Clubs or Organizations:   . Attends Archivist Meetings:   Marland Kitchen Marital Status:   Intimate Partner Violence:   . Fear of Current or Ex-Partner:   . Emotionally Abused:   Marland Kitchen Physically Abused:   . Sexually Abused:     Outpatient Medications Prior to Visit  Medication Sig Dispense Refill  . ACCU-CHEK AVIVA PLUS test strip USE TO CHECK BLOOD SUGAR UP TO TWICE DAILY OR AS INSTRUCTED 100 each 2  . acetaminophen  (TYLENOL) 500 MG tablet Take 1,000 mg by mouth as needed for mild pain.     Marland Kitchen amiodarone (PACERONE) 400 MG tablet Take 1 tablet (400 mg total) by mouth 2 (two) times daily. 30 tablet 1  . apixaban (ELIQUIS) 5 MG TABS tablet Take 1 tablet (5 mg total) by mouth 2 (two) times daily. 180 tablet 1  . B-D INS SYR ULTRAFINE 1CC/31G 31G X 5/16" 1 ML MISC USE TO INJECT INSULIN TWICE A DAY AS INSTRUCTED (Patient taking differently: 1 Stick by Other route 3 (three) times daily before meals. ) 100 each 2  . Calcium Carbonate (CALCIUM 600 PO) Take 1 tablet by mouth daily.    . carvedilol (COREG) 25 MG tablet Take 25 mg by mouth 2 (two) times daily with a meal.    . Cholecalciferol (VITAMIN D3 PO) 2 gummies by mouth daily    . Cyanocobalamin (VITAMIN B-12 PO) Take by mouth. 2 gummies by mouth daily    . fenofibrate (TRICOR) 145 MG tablet TAKE 1 TABLET BY MOUTH EVERY DAY (Patient taking differently: Take 145 mg by mouth daily. ) 90 tablet 3  . hydrALAZINE (APRESOLINE) 10 MG tablet Take 10 mg by mouth 2 (two) times daily.     . insulin NPH Human (NOVOLIN N) 100 UNIT/ML injection Inject 25-50 Units into the skin See admin instructions. 50 units in the am 60 units at lunch and 20 units for supper - on a sliding scale    . Insulin Pen Needle 32G X 4 MM MISC Use to inject insulin with insulin pen 100 each 4  . LIVALO 4 MG TABS Take 4 mg by mouth every evening.    . meclizine (ANTIVERT) 12.5 MG tablet Take 1 tablet (12.5 mg total) by mouth 3 (three) times daily as needed for dizziness. 30 tablet 5  . Multiple Vitamins-Minerals (MULTIVITAMIN GUMMIES ADULT PO) Taking two gummies by mouth daily    . nutrition supplement, JUVEN, (JUVEN) PACK Take 1 packet by mouth 2 (two) times daily between meals. 60 packet 0  . ondansetron (ZOFRAN ODT) 4 MG disintegrating tablet Take 1 tablet (4 mg total) by mouth every 8 (eight) hours as needed for nausea. (Patient taking differently: Take 4 mg by mouth as needed for nausea. ) 10 tablet 0   .  PRALUENT 75 MG/ML SOAJ INJECT 75 MG INTO THE SKIN EVERY 14 (FOURTEEN) DAYS. 2 pen 6  . RELION INSULIN SYRINGE 31G X 15/64" 1 ML MISC Inject 1 Syringe into the skin 3 (three) times daily.    . Tafamidis (VYNDAMAX) 61 MG CAPS Take 61 mg by mouth daily. 30 capsule 11  . torsemide (DEMADEX) 20 MG tablet Take 20 mg twice a day and alternate with 20 mg daily the next 30 tablet 5  . VASCEPA 1 g capsule TAKE 2 CAPSULES (2 G TOTAL) BY MOUTH 2 (TWO) TIMES A DAY. 120 capsule 6   No facility-administered medications prior to visit.    Allergies  Allergen Reactions  . Amlodipine Swelling    Ankle swelling  . Atorvastatin Other (See Comments)    Myalgias   . Crestor [Rosuvastatin Calcium] Other (See Comments)    Stiffness and back pain  . Ezetimibe-Simvastatin Other (See Comments)    Leg cramps  . Sitagliptin Other (See Comments)  . Statins Other (See Comments)    Myalgias  . Celebrex [Celecoxib] Rash  . Levemir [Insulin Detemir] Rash    ROS Review of Systems  Constitutional: Positive for fatigue.       Reports weakness  HENT: Negative.   Eyes: Negative.   Respiratory: Negative.   Cardiovascular: Negative for chest pain, palpitations and leg swelling.  Gastrointestinal: Negative.   Endocrine: Negative.   Genitourinary: Negative.   Musculoskeletal: Negative.   Skin: Negative for rash.  Neurological: Negative.       Objective:    Physical Exam Vitals reviewed.  Constitutional:      Appearance: Normal appearance.  HENT:     Head: Normocephalic.     Nose: Nose normal.  Eyes:     Conjunctiva/sclera: Conjunctivae normal.  Cardiovascular:     Rate and Rhythm: Normal rate and regular rhythm.     Pulses: Normal pulses.     Heart sounds: Normal heart sounds.  Pulmonary:     Breath sounds: Normal breath sounds.  Abdominal:     General: Bowel sounds are normal.  Musculoskeletal:     Cervical back: Neck supple.     Comments: generalize weakness  Skin:    Findings: No  erythema or rash.  Neurological:     Mental Status: She is alert and oriented to person, place, and time.  Psychiatric:        Mood and Affect: Mood normal.     BP (!) 130/58   Pulse (!) 53   Temp (!) 97.3 F (36.3 C) (Temporal)   Resp (!) 22   Ht 5\' 7"  (1.702 m)   Wt 208 lb 9.6 oz (94.6 kg)   SpO2 96%   BMI 32.67 kg/m  Wt Readings from Last 3 Encounters:  01/24/20 208 lb 9.6 oz (94.6 kg)  01/17/20 205 lb 9.6 oz (93.3 kg)  12/25/19 201 lb 3.2 oz (91.3 kg)     Health Maintenance Due  Topic Date Due  . DEXA SCAN  09/26/2018  . MAMMOGRAM  10/17/2019      Lab Results  Component Value Date   TSH 0.780 02/14/2018   Lab Results  Component Value Date   WBC 8.5 01/16/2020   HGB 11.3 (L) 01/16/2020   HCT 36.3 01/16/2020   MCV 81.4 01/16/2020   PLT 238 01/16/2020   Lab Results  Component Value Date   NA 140 01/17/2020   K 3.3 (L) 01/17/2020   CO2 28 01/17/2020   GLUCOSE 253 (H) 01/17/2020  BUN 59 (H) 01/17/2020   CREATININE 3.08 (H) 01/17/2020   BILITOT 0.8 01/17/2020   ALKPHOS 34 (L) 01/17/2020   AST 39 01/17/2020   ALT 21 01/17/2020   PROT 5.6 (L) 01/17/2020   ALBUMIN 3.0 (L) 01/17/2020   CALCIUM 8.7 (L) 01/17/2020   ANIONGAP 14 01/17/2020   GFR 55.42 (L) 12/19/2011   Lab Results  Component Value Date   CHOL 107 10/08/2019   Lab Results  Component Value Date   HDL 36 (L) 10/08/2019   Lab Results  Component Value Date   LDLCALC 35 10/08/2019   Lab Results  Component Value Date   TRIG 229 (H) 10/08/2019   Lab Results  Component Value Date   CHOLHDL 3.3 07/22/2019   Lab Results  Component Value Date   HGBA1C 10.2 (H) 01/16/2020      Assessment & Plan:  Hospital discharge follow-up Patient is a 69 year old female who presents to clinic after hospitalization for heart attack, A. fib.  Patient reports weakness but denies any fever, nausea, and chest pain.  Patient is not reporting any signs or symptoms of heart attack today.  Hospital  discharge instructions completed patient verbalized understanding.  Printed handout given to patient. Patient knows to  follow-up with any signs or symptoms of heart attack.  Problem List Items Addressed This Visit      Other   Hospital discharge follow-up - Primary    Patient is a 69 year old female who presents to clinic after hospitalization for heart attack, A. fib.  Patient reports weakness but denies any fever, nausea, and chest pain.  Patient is not reporting any signs or symptoms of heart attack today.  Hospital discharge instructions completed patient verbalized understanding.  Printed handout given to patient. Patient knows to  follow-up with any signs or symptoms of heart attack.           Follow-up: Return if symptoms worsen or fail to improve.    Ivy Lynn, NP

## 2020-01-24 NOTE — Assessment & Plan Note (Signed)
Patient is a 69 year old female who presents to clinic after hospitalization for heart attack, A. fib.  Patient reports weakness but denies any fever, nausea, and chest pain.  Patient is not reporting any signs or symptoms of heart attack today.  Hospital discharge instructions completed patient verbalized understanding.  Printed handout given to patient. Patient knows to  follow-up with any signs or symptoms of heart attack.

## 2020-01-29 ENCOUNTER — Ambulatory Visit (HOSPITAL_COMMUNITY)
Admission: RE | Admit: 2020-01-29 | Discharge: 2020-01-29 | Disposition: A | Discharge status: Home / Self Care | Payer: Medicare Other | Source: Ambulatory Visit | Attending: Internal Medicine | Admitting: Internal Medicine

## 2020-01-29 DIAGNOSIS — I480 Paroxysmal atrial fibrillation: Secondary | ICD-10-CM

## 2020-02-03 ENCOUNTER — Other Ambulatory Visit: Payer: Self-pay | Admitting: Cardiology

## 2020-02-04 MED FILL — VYNDAMAX 61 MG CAPS: 61 | 30 days supply | Qty: 30 | Fill #5

## 2020-02-05 ENCOUNTER — Telehealth (HOSPITAL_COMMUNITY): Payer: Self-pay

## 2020-02-05 DIAGNOSIS — I5032 Chronic diastolic (congestive) heart failure: Secondary | ICD-10-CM

## 2020-02-05 MED ORDER — TORSEMIDE 20 MG PO TABS: 20.0000 mg | ORAL_TABLET | Freq: Two times a day (BID) | ORAL | 5 refills | Status: DC

## 2020-02-05 NOTE — Telephone Encounter (Signed)
-----   Message from Larey Dresser, MD sent at 02/04/2020  2:05 PM EDT ----- Keep torsemide at 20 mg bid for now, BMET 1 week.

## 2020-02-05 NOTE — Telephone Encounter (Signed)
Patient advised and verbalized understanding.lab order entered patient will have repeat labs done at [facility] in [place]. Med list updated to reflect changes

## 2020-02-07 ENCOUNTER — Telehealth (HOSPITAL_COMMUNITY): Payer: Self-pay | Admitting: Cardiology

## 2020-02-07 NOTE — Telephone Encounter (Signed)
Minimal IV dye will be used, should not cause any issues with kidneys.

## 2020-02-07 NOTE — Telephone Encounter (Signed)
Patient approved for cardiomems however is hesitant to schedule procedure. Reports she was told to not schedule any more procedure that would require iv dyes with worsening kidney function.   Advised would forward to provider as Im not sure if dye or amount thereof is required for this procedure

## 2020-02-11 ENCOUNTER — Other Ambulatory Visit: Payer: Medicare Other

## 2020-02-11 ENCOUNTER — Telehealth (HOSPITAL_COMMUNITY): Payer: Self-pay | Admitting: *Deleted

## 2020-02-11 ENCOUNTER — Other Ambulatory Visit: Payer: Self-pay

## 2020-02-11 DIAGNOSIS — I5032 Chronic diastolic (congestive) heart failure: Secondary | ICD-10-CM

## 2020-02-11 NOTE — Telephone Encounter (Signed)
Pt called stating her weight is up an additional 5lbs from last week. Pt said her weight is up a total of 10lbs in about 10 days.  Pt c/o shortness of breath and last night had to sleep in a recliner because she could not sleep flat. pt does not notice any swelling. Pt denies chest pain. Pt denies changes in diet and states she is taking medications as prescribed pts weight is 210lbs she said she normally weighs 200lbs on her home scale.   Routed to [place] for advice

## 2020-02-11 NOTE — Telephone Encounter (Signed)
Take extra 40 mg of torsemide for 2 days.   Please call.   Frayda Egley NP-C  2:03 PM

## 2020-02-11 NOTE — Telephone Encounter (Signed)
Patient advised and verbalized understanding.however, patient doesn't want to schedule today due to her being weak(spoke with Triage about this already). She stated she will schedule it at a later date.

## 2020-02-11 NOTE — Telephone Encounter (Signed)
Pt aware and verbalized understanding,

## 2020-02-12 LAB — BASIC METABOLIC PANEL
BUN/Creatinine Ratio: 18 (ref 12–28)
BUN: 62 mg/dL — ABNORMAL HIGH (ref 8–27)
CO2: 26 mmol/L (ref 20–29)
Calcium: 8.5 mg/dL — ABNORMAL LOW (ref 8.7–10.3)
Chloride: 102 mmol/L (ref 96–106)
Creatinine, Ser: 3.44 mg/dL (ref 0.57–1.00)
GFR calc Af Amer: 15 mL/min/{1.73_m2} — ABNORMAL LOW (ref >59)
GFR calc non Af Amer: 13 mL/min/{1.73_m2} — ABNORMAL LOW (ref >59)
Glucose: 225 mg/dL — ABNORMAL HIGH (ref 65–99)
Potassium: 4.4 mmol/L (ref 3.5–5.2)
Sodium: 145 mmol/L — ABNORMAL HIGH (ref 134–144)

## 2020-02-13 ENCOUNTER — Telehealth (HOSPITAL_COMMUNITY): Payer: Self-pay | Admitting: *Deleted

## 2020-02-13 DIAGNOSIS — N183 Chronic kidney disease, stage 3 unspecified: Secondary | ICD-10-CM

## 2020-02-13 DIAGNOSIS — E1122 Type 2 diabetes mellitus with diabetic chronic kidney disease: Secondary | ICD-10-CM

## 2020-02-13 DIAGNOSIS — I5032 Chronic diastolic (congestive) heart failure: Secondary | ICD-10-CM

## 2020-02-13 MED ORDER — TORSEMIDE 20 MG PO TABS: 20.0000 mg | ORAL_TABLET | Freq: Every day | ORAL | 5 refills | Status: DC

## 2020-02-13 NOTE — Telephone Encounter (Signed)
-----   Message from Larey Dresser, MD sent at 02/12/2020 11:26 PM EDT ----- Decrease torsemide to 20 mg once daily with BMET in 10 days.

## 2020-02-13 NOTE — Telephone Encounter (Signed)
Pt aware and agreeable, she will let us know if she feels she is retaining fluid, med list updated, pt will go to PCP office next week for labs, order pla

## 2020-02-19 ENCOUNTER — Emergency Department (HOSPITAL_COMMUNITY): Payer: Medicare Other

## 2020-02-19 ENCOUNTER — Other Ambulatory Visit: Payer: Self-pay

## 2020-02-19 ENCOUNTER — Inpatient Hospital Stay (HOSPITAL_COMMUNITY)
Admission: EM | Admit: 2020-02-19 | Discharge: 2020-02-27 | DRG: 308 | Disposition: A | Discharge status: Home Health | Payer: Medicare Other | Attending: Internal Medicine | Admitting: Internal Medicine

## 2020-02-19 DIAGNOSIS — K219 Gastro-esophageal reflux disease without esophagitis: Secondary | ICD-10-CM | POA: Diagnosis present

## 2020-02-19 DIAGNOSIS — I440 Atrioventricular block, first degree: Secondary | ICD-10-CM | POA: Diagnosis not present

## 2020-02-19 DIAGNOSIS — J189 Pneumonia, unspecified organism: Secondary | ICD-10-CM | POA: Diagnosis present

## 2020-02-19 DIAGNOSIS — Z823 Family history of stroke: Secondary | ICD-10-CM

## 2020-02-19 DIAGNOSIS — E66813 Obesity, class 3: Secondary | ICD-10-CM

## 2020-02-19 DIAGNOSIS — G9389 Other specified disorders of brain: Secondary | ICD-10-CM | POA: Diagnosis not present

## 2020-02-19 DIAGNOSIS — N390 Urinary tract infection, site not specified: Secondary | ICD-10-CM | POA: Diagnosis present

## 2020-02-19 DIAGNOSIS — N184 Chronic kidney disease, stage 4 (severe): Secondary | ICD-10-CM | POA: Diagnosis present

## 2020-02-19 DIAGNOSIS — Z8673 Personal history of transient ischemic attack (TIA), and cerebral infarction without residual deficits: Secondary | ICD-10-CM

## 2020-02-19 DIAGNOSIS — I2510 Atherosclerotic heart disease of native coronary artery without angina pectoris: Secondary | ICD-10-CM | POA: Diagnosis not present

## 2020-02-19 DIAGNOSIS — K5900 Constipation, unspecified: Secondary | ICD-10-CM | POA: Diagnosis not present

## 2020-02-19 DIAGNOSIS — I43 Cardiomyopathy in diseases classified elsewhere: Secondary | ICD-10-CM | POA: Diagnosis present

## 2020-02-19 DIAGNOSIS — E669 Obesity, unspecified: Secondary | ICD-10-CM | POA: Diagnosis present

## 2020-02-19 DIAGNOSIS — Z9884 Bariatric surgery status: Secondary | ICD-10-CM

## 2020-02-19 DIAGNOSIS — I959 Hypotension, unspecified: Secondary | ICD-10-CM | POA: Diagnosis present

## 2020-02-19 DIAGNOSIS — N189 Chronic kidney disease, unspecified: Secondary | ICD-10-CM | POA: Diagnosis not present

## 2020-02-19 DIAGNOSIS — Z6832 Body mass index (BMI) 32.0-32.9, adult: Secondary | ICD-10-CM

## 2020-02-19 DIAGNOSIS — I4819 Other persistent atrial fibrillation: Secondary | ICD-10-CM | POA: Diagnosis not present

## 2020-02-19 DIAGNOSIS — Z953 Presence of xenogenic heart valve: Secondary | ICD-10-CM

## 2020-02-19 DIAGNOSIS — Z9049 Acquired absence of other specified parts of digestive tract: Secondary | ICD-10-CM

## 2020-02-19 DIAGNOSIS — I252 Old myocardial infarction: Secondary | ICD-10-CM

## 2020-02-19 DIAGNOSIS — I7090 Unspecified atherosclerosis: Secondary | ICD-10-CM | POA: Diagnosis not present

## 2020-02-19 DIAGNOSIS — I471 Supraventricular tachycardia: Secondary | ICD-10-CM | POA: Diagnosis present

## 2020-02-19 DIAGNOSIS — I129 Hypertensive chronic kidney disease with stage 1 through stage 4 chronic kidney disease, or unspecified chronic kidney disease: Secondary | ICD-10-CM | POA: Diagnosis not present

## 2020-02-19 DIAGNOSIS — R778 Other specified abnormalities of plasma proteins: Secondary | ICD-10-CM

## 2020-02-19 DIAGNOSIS — Z833 Family history of diabetes mellitus: Secondary | ICD-10-CM

## 2020-02-19 DIAGNOSIS — J9601 Acute respiratory failure with hypoxia: Secondary | ICD-10-CM | POA: Diagnosis present

## 2020-02-19 DIAGNOSIS — Z961 Presence of intraocular lens: Secondary | ICD-10-CM | POA: Diagnosis present

## 2020-02-19 DIAGNOSIS — Z20822 Contact with and (suspected) exposure to covid-19: Secondary | ICD-10-CM | POA: Diagnosis present

## 2020-02-19 DIAGNOSIS — K589 Irritable bowel syndrome without diarrhea: Secondary | ICD-10-CM | POA: Diagnosis present

## 2020-02-19 DIAGNOSIS — R739 Hyperglycemia, unspecified: Secondary | ICD-10-CM | POA: Diagnosis not present

## 2020-02-19 DIAGNOSIS — I130 Hypertensive heart and chronic kidney disease with heart failure and stage 1 through stage 4 chronic kidney disease, or unspecified chronic kidney disease: Secondary | ICD-10-CM | POA: Diagnosis not present

## 2020-02-19 DIAGNOSIS — E876 Hypokalemia: Secondary | ICD-10-CM | POA: Diagnosis present

## 2020-02-19 DIAGNOSIS — R0602 Shortness of breath: Secondary | ICD-10-CM | POA: Diagnosis not present

## 2020-02-19 DIAGNOSIS — Z886 Allergy status to analgesic agent status: Secondary | ICD-10-CM

## 2020-02-19 DIAGNOSIS — Z794 Long term (current) use of insulin: Secondary | ICD-10-CM

## 2020-02-19 DIAGNOSIS — I495 Sick sinus syndrome: Secondary | ICD-10-CM | POA: Diagnosis present

## 2020-02-19 DIAGNOSIS — I509 Heart failure, unspecified: Secondary | ICD-10-CM

## 2020-02-19 DIAGNOSIS — Z79899 Other long term (current) drug therapy: Secondary | ICD-10-CM

## 2020-02-19 DIAGNOSIS — Z888 Allergy status to other drugs, medicaments and biological substances status: Secondary | ICD-10-CM

## 2020-02-19 DIAGNOSIS — Z7901 Long term (current) use of anticoagulants: Secondary | ICD-10-CM

## 2020-02-19 DIAGNOSIS — E872 Acidosis: Secondary | ICD-10-CM | POA: Diagnosis present

## 2020-02-19 DIAGNOSIS — M810 Age-related osteoporosis without current pathological fracture: Secondary | ICD-10-CM | POA: Diagnosis present

## 2020-02-19 DIAGNOSIS — Z8249 Family history of ischemic heart disease and other diseases of the circulatory system: Secondary | ICD-10-CM

## 2020-02-19 DIAGNOSIS — G4733 Obstructive sleep apnea (adult) (pediatric): Secondary | ICD-10-CM | POA: Diagnosis present

## 2020-02-19 DIAGNOSIS — E11649 Type 2 diabetes mellitus with hypoglycemia without coma: Secondary | ICD-10-CM

## 2020-02-19 DIAGNOSIS — E785 Hyperlipidemia, unspecified: Secondary | ICD-10-CM | POA: Diagnosis present

## 2020-02-19 DIAGNOSIS — R7989 Other specified abnormal findings of blood chemistry: Secondary | ICD-10-CM

## 2020-02-19 DIAGNOSIS — G9340 Encephalopathy, unspecified: Secondary | ICD-10-CM | POA: Diagnosis not present

## 2020-02-19 DIAGNOSIS — E1165 Type 2 diabetes mellitus with hyperglycemia: Secondary | ICD-10-CM | POA: Diagnosis present

## 2020-02-19 DIAGNOSIS — Z049 Encounter for examination and observation for unspecified reason: Secondary | ICD-10-CM | POA: Diagnosis not present

## 2020-02-19 DIAGNOSIS — R001 Bradycardia, unspecified: Secondary | ICD-10-CM

## 2020-02-19 DIAGNOSIS — R531 Weakness: Secondary | ICD-10-CM | POA: Diagnosis not present

## 2020-02-19 DIAGNOSIS — Z9861 Coronary angioplasty status: Secondary | ICD-10-CM

## 2020-02-19 DIAGNOSIS — E1129 Type 2 diabetes mellitus with other diabetic kidney complication: Secondary | ICD-10-CM | POA: Diagnosis present

## 2020-02-19 DIAGNOSIS — E1122 Type 2 diabetes mellitus with diabetic chronic kidney disease: Secondary | ICD-10-CM

## 2020-02-19 DIAGNOSIS — I10 Essential (primary) hypertension: Secondary | ICD-10-CM

## 2020-02-19 DIAGNOSIS — I350 Nonrheumatic aortic (valve) stenosis: Secondary | ICD-10-CM

## 2020-02-19 DIAGNOSIS — I340 Nonrheumatic mitral (valve) insufficiency: Secondary | ICD-10-CM | POA: Diagnosis present

## 2020-02-19 DIAGNOSIS — N179 Acute kidney failure, unspecified: Secondary | ICD-10-CM | POA: Diagnosis present

## 2020-02-19 DIAGNOSIS — I5033 Acute on chronic diastolic (congestive) heart failure: Secondary | ICD-10-CM | POA: Diagnosis present

## 2020-02-19 DIAGNOSIS — Z8679 Personal history of other diseases of the circulatory system: Secondary | ICD-10-CM

## 2020-02-19 DIAGNOSIS — E1169 Type 2 diabetes mellitus with other specified complication: Secondary | ICD-10-CM | POA: Diagnosis present

## 2020-02-19 DIAGNOSIS — Z9119 Patient's noncompliance with other medical treatment and regimen: Secondary | ICD-10-CM

## 2020-02-19 DIAGNOSIS — I6389 Other cerebral infarction: Secondary | ICD-10-CM | POA: Diagnosis not present

## 2020-02-19 DIAGNOSIS — Z9841 Cataract extraction status, right eye: Secondary | ICD-10-CM

## 2020-02-19 DIAGNOSIS — B961 Klebsiella pneumoniae [K. pneumoniae] as the cause of diseases classified elsewhere: Secondary | ICD-10-CM | POA: Diagnosis present

## 2020-02-19 DIAGNOSIS — Z87442 Personal history of urinary calculi: Secondary | ICD-10-CM

## 2020-02-19 DIAGNOSIS — Z9842 Cataract extraction status, left eye: Secondary | ICD-10-CM

## 2020-02-19 DIAGNOSIS — I5032 Chronic diastolic (congestive) heart failure: Secondary | ICD-10-CM

## 2020-02-19 DIAGNOSIS — E854 Organ-limited amyloidosis: Secondary | ICD-10-CM | POA: Diagnosis present

## 2020-02-19 LAB — BASIC METABOLIC PANEL
Anion gap: 12 (ref 5–15)
BUN: 62 mg/dL — ABNORMAL HIGH (ref 8–23)
CO2: 20 mmol/L — ABNORMAL LOW (ref 22–32)
Calcium: 8 mg/dL — ABNORMAL LOW (ref 8.9–10.3)
Chloride: 106 mmol/L (ref 98–111)
Creatinine, Ser: 3.29 mg/dL — ABNORMAL HIGH (ref 0.44–1.00)
GFR calc Af Amer: 16 mL/min — ABNORMAL LOW (ref >60)
GFR calc non Af Amer: 14 mL/min — ABNORMAL LOW (ref >60)
Glucose, Bld: 473 mg/dL — ABNORMAL HIGH (ref 70–99)
Potassium: 5 mmol/L (ref 3.5–5.1)
Sodium: 138 mmol/L (ref 135–145)

## 2020-02-19 LAB — CBC WITH DIFFERENTIAL/PLATELET
Abs Immature Granulocytes: 0.08 10*3/uL — ABNORMAL HIGH (ref 0.00–0.07)
Basophils Absolute: 0.1 10*3/uL (ref 0.0–0.1)
Basophils Relative: 1 %
Eosinophils Absolute: 0.4 10*3/uL (ref 0.0–0.5)
Eosinophils Relative: 5 %
HCT: 32.5 % — ABNORMAL LOW (ref 36.0–46.0)
Hemoglobin: 9.5 g/dL — ABNORMAL LOW (ref 12.0–15.0)
Immature Granulocytes: 1 %
Lymphocytes Relative: 17 %
Lymphs Abs: 1.3 10*3/uL (ref 0.7–4.0)
MCH: 23.8 pg — ABNORMAL LOW (ref 26.0–34.0)
MCHC: 29.2 g/dL — ABNORMAL LOW (ref 30.0–36.0)
MCV: 81.3 fL (ref 80.0–100.0)
Monocytes Absolute: 0.5 10*3/uL (ref 0.1–1.0)
Monocytes Relative: 7 %
Neutro Abs: 5.4 10*3/uL (ref 1.7–7.7)
Neutrophils Relative %: 69 %
Platelets: 195 10*3/uL (ref 150–400)
RBC: 4 MIL/uL (ref 3.87–5.11)
RDW: 15.4 % (ref 11.5–15.5)
WBC: 7.8 10*3/uL (ref 4.0–10.5)
nRBC: 0.3 % — ABNORMAL HIGH (ref 0.0–0.2)

## 2020-02-19 LAB — CBG MONITORING, ED
Glucose-Capillary: 385 mg/dL — ABNORMAL HIGH (ref 70–99)
Glucose-Capillary: 206 mg/dL — ABNORMAL HIGH (ref 70–99)

## 2020-02-19 LAB — I-STAT VENOUS BLOOD GAS, ED
Acid-base deficit: 6 mmol/L — ABNORMAL HIGH (ref 0.0–2.0)
Bicarbonate: 22.3 mmol/L (ref 20.0–28.0)
Calcium, Ion: 1.1 mmol/L — ABNORMAL LOW (ref 1.15–1.40)
HCT: 32 % — ABNORMAL LOW (ref 36.0–46.0)
Hemoglobin: 10.9 g/dL — ABNORMAL LOW (ref 12.0–15.0)
O2 Saturation: 94 %
Potassium: 4.9 mmol/L (ref 3.5–5.1)
Sodium: 140 mmol/L (ref 135–145)
TCO2: 24 mmol/L (ref 22–32)
pCO2, Ven: 55.4 mmHg (ref 44.0–60.0)
pH, Ven: 7.212 — ABNORMAL LOW (ref 7.250–7.430)
pO2, Ven: 85 mmHg — ABNORMAL HIGH (ref 32.0–45.0)

## 2020-02-19 LAB — PROTIME-INR
INR: 1.8 — ABNORMAL HIGH (ref 0.8–1.2)
Prothrombin Time: 19.9 seconds — ABNORMAL HIGH (ref 11.4–15.2)

## 2020-02-19 LAB — URINALYSIS, ROUTINE W REFLEX MICROSCOPIC
Bilirubin Urine: NEGATIVE
Glucose, UA: >=500 mg/dL — AB
Ketones, ur: NEGATIVE mg/dL
Nitrite: POSITIVE — AB
Protein, ur: NEGATIVE mg/dL
Specific Gravity, Urine: 1.011 (ref 1.005–1.030)
pH: 5 (ref 5.0–8.0)

## 2020-02-19 LAB — LACTIC ACID, PLASMA
Lactic Acid, Venous: 3.1 mmol/L (ref 0.5–1.9)
Lactic Acid, Venous: 1.8 mmol/L (ref 0.5–1.9)

## 2020-02-19 LAB — TROPONIN I (HIGH SENSITIVITY)
Troponin I (High Sensitivity): 22 ng/L — ABNORMAL HIGH (ref <18)
Troponin I (High Sensitivity): 21 ng/L — ABNORMAL HIGH (ref <18)

## 2020-02-19 LAB — BRAIN NATRIURETIC PEPTIDE: B Natriuretic Peptide: 330.5 pg/mL — ABNORMAL HIGH (ref 0.0–100.0)

## 2020-02-19 LAB — SARS CORONAVIRUS 2 BY RT PCR (HOSPITAL ORDER, PERFORMED IN ~~LOC~~ HOSPITAL LAB): SARS Coronavirus 2: NEGATIVE

## 2020-02-19 MED ORDER — HYDRALAZINE HCL 25 MG PO TABS
25.0000 mg | ORAL_TABLET | Freq: Four times a day (QID) | ORAL | Status: DC | PRN
  Administered 2020-02-21 – 2020-02-22 (×2): 25 mg via ORAL
  Filled 2020-02-19 (×4): qty 1

## 2020-02-19 MED ORDER — GUAIFENESIN 100 MG/5ML PO SOLN
5.0000 mL | ORAL | Status: DC | PRN
  Filled 2020-02-19: qty 5

## 2020-02-19 MED ORDER — ACETAMINOPHEN 500 MG PO TABS
1000.0000 mg | ORAL_TABLET | ORAL | Status: DC | PRN
  Administered 2020-02-27: 1000 mg via ORAL
  Filled 2020-02-19: qty 2

## 2020-02-19 MED ORDER — ICOSAPENT ETHYL 1 G PO CAPS: 2.0000 g | ORAL_CAPSULE | Freq: Two times a day (BID) | ORAL | Status: DC

## 2020-02-19 MED ORDER — ATROPINE SULFATE 1 MG/10ML IJ SOSY
0.5000 mg | PREFILLED_SYRINGE | Freq: Once | INTRAMUSCULAR | Status: AC
  Administered 2020-02-19: 0.5 mg via INTRAVENOUS

## 2020-02-19 MED ORDER — MECLIZINE HCL 12.5 MG PO TABS
12.5000 mg | ORAL_TABLET | Freq: Three times a day (TID) | ORAL | Status: DC | PRN
  Filled 2020-02-19: qty 1

## 2020-02-19 MED ORDER — ATROPINE SULFATE 1 MG/10ML IJ SOSY: 0.5000 mg | PREFILLED_SYRINGE | Freq: Once | INTRAMUSCULAR | Status: DC

## 2020-02-19 MED ORDER — OMEGA-3-ACID ETHYL ESTERS 1 G PO CAPS
2.0000 g | ORAL_CAPSULE | Freq: Two times a day (BID) | ORAL | Status: DC
  Administered 2020-02-19 – 2020-02-27 (×15): 2 g via ORAL
  Filled 2020-02-19 (×17): qty 2

## 2020-02-19 MED ORDER — SODIUM CHLORIDE 0.9 % IV BOLUS
500.0000 mL | Freq: Once | INTRAVENOUS | Status: AC
  Administered 2020-02-19: 500 mL via INTRAVENOUS

## 2020-02-19 MED ORDER — FENOFIBRATE 54 MG PO TABS
54.0000 mg | ORAL_TABLET | Freq: Every day | ORAL | Status: DC
  Administered 2020-02-20 – 2020-02-27 (×8): 54 mg via ORAL
  Filled 2020-02-19 (×8): qty 1

## 2020-02-19 MED ORDER — APIXABAN 5 MG PO TABS
5.0000 mg | ORAL_TABLET | Freq: Two times a day (BID) | ORAL | Status: DC
  Administered 2020-02-19 – 2020-02-27 (×16): 5 mg via ORAL
  Filled 2020-02-19 (×17): qty 1

## 2020-02-19 MED ORDER — SODIUM CHLORIDE 0.9 % IV BOLUS (SEPSIS): 1000.0000 mL | Freq: Once | INTRAVENOUS | Status: DC

## 2020-02-19 MED ORDER — INSULIN ASPART 100 UNIT/ML ~~LOC~~ SOLN
0.0000 [IU] | Freq: Three times a day (TID) | SUBCUTANEOUS | Status: DC
  Administered 2020-02-20: 11 [IU] via SUBCUTANEOUS
  Administered 2020-02-20: 15 [IU] via SUBCUTANEOUS
  Administered 2020-02-20 – 2020-02-21 (×3): 5 [IU] via SUBCUTANEOUS
  Administered 2020-02-21: 15 [IU] via SUBCUTANEOUS
  Administered 2020-02-22 (×2): 11 [IU] via SUBCUTANEOUS

## 2020-02-19 MED ORDER — SODIUM CHLORIDE 0.9 % IV SOLN: INTRAVENOUS | Status: DC | PRN

## 2020-02-19 MED ORDER — TAFAMIDIS 61 MG PO CAPS
61.0000 mg | ORAL_CAPSULE | Freq: Every day | ORAL | Status: DC
  Administered 2020-02-20: 61 mg via ORAL

## 2020-02-19 MED ORDER — SODIUM CHLORIDE 0.9 % IV SOLN: 1000.0000 mL | INTRAVENOUS | Status: DC

## 2020-02-19 MED ORDER — CALCIUM CARBONATE 1250 (500 CA) MG PO TABS
1250.0000 mg | ORAL_TABLET | Freq: Every day | ORAL | Status: DC
  Administered 2020-02-20 – 2020-02-27 (×7): 1250 mg via ORAL
  Filled 2020-02-19 (×8): qty 1

## 2020-02-19 MED ORDER — ONDANSETRON 4 MG PO TBDP: 4.0000 mg | ORAL_TABLET | ORAL | Status: DC | PRN

## 2020-02-19 MED ORDER — VITAMIN B-12 100 MCG PO TABS
100.0000 ug | ORAL_TABLET | Freq: Every day | ORAL | Status: DC
  Administered 2020-02-19 – 2020-02-27 (×9): 100 ug via ORAL
  Filled 2020-02-19 (×9): qty 1

## 2020-02-19 MED ORDER — INSULIN ASPART 100 UNIT/ML ~~LOC~~ SOLN
10.0000 [IU] | Freq: Once | SUBCUTANEOUS | Status: AC
  Administered 2020-02-19: 10 [IU] via SUBCUTANEOUS

## 2020-02-19 MED ORDER — INSULIN ASPART 100 UNIT/ML ~~LOC~~ SOLN
10.0000 [IU] | Freq: Once | SUBCUTANEOUS | Status: AC
  Administered 2020-02-19: 10 [IU] via INTRAVENOUS

## 2020-02-19 NOTE — ED Notes (Signed)
Cards at bedside

## 2020-02-19 NOTE — ED Notes (Signed)
Medications not verified by pharm. Messaged pharm to request meds.

## 2020-02-19 NOTE — ED Notes (Signed)
[facility] MD , called RN back states that he will change order parameters to below 40 for contacting MD. Pt in NAD at this time, RN will continue to monitor

## 2020-02-19 NOTE — ED Notes (Signed)
ED Provider at bedside. Pt has been placed on the zoll. Pt on 3 lpm oxygen. Pt does not normally use oxygen. Pt has OSA and does not use CPAP.

## 2020-02-19 NOTE — Consult Note (Addendum)
Cardiology Consultation:   Patient ID: [name] MRN: [id number]; DOB: April 08, 1951  Admit date: 02/19/2020 Date of Consult: 02/19/2020  Primary Care Provider: [name], DO [facility] Cardiologist: Minus Breeding, MD  [place] Electrophysiologist:  Thompson Grayer, MD  Heart Failure: Dr. Aundra Dubin   Patient Profile:   [name] is a 69 y.o. female with a hx of  with a hx of AS s/p TAVR, chronic diastolic CHF, Persistent atrial fibrillation, CKD IIIb, HLD, HTN, OSA,TTR amyloidosis,morbid obesity, medically managed CAD, PAD s/p PTCA R pop & R AT 02/2019, L dSFA-TP & L TMA 09/2019 who is being seen today for the evaluation of Bradycardia at the request of Dr. Stark Jock.   Cath 2019 showed moderate RCA, LCX and OM disease. 99% dLAD disease, Vessel small for PCI. Recommended medical therapy.   Admitted 12/10/2019 for chest pain, non-STEMI by enzymes but felt demand ischemia secondary to atrial fibrillation, RVR.  She spontaneously converted to sinus rhythm.  She was seen in the A. fib clinic on 5/12.  Echo 12/10/19: Normal LV systolic function; moderate LVH; grade 2 diastolic  dsyfunction with elevated LV filling pressure; s/p AVR with normal mean  gradient of 16 mmHg and no AI; AVA 1.6 cm2; MAC with mild MS (mean  gradient 5 mmHg and MVA 2 cm2).   Admitted 01/2020 overnight for afib RVR. Converted to sinus rhythm on amiodarone. Discharged on amiodarone 449m BID.   Established care with Dr. ARayann Heman6/02/2020 for symptomatic recurrent persistent atrial fibrillation. Felt low success with ablation. Recommended continuation of amiodarone.   History of Present Illness:   Ms. LTejerabrought by EMS for lethargy and weakness. Patient reported 1 week hx of intermittent dyspnea and weight gain. Has nasal congestion for past 2 days. Reports compliance with low sodium and medication. For the past few days she has intermittent hypoglycemia and she tried to eat more. This morning she had  severe lethargy and weakness. CBG was in 40s. Tried to eat more. Took all morning AM. EMS was called and noted bradycardic with CBG of 490. Brought to ER for further evaluation.   BUN/Scr 62/3.29 Hs-troponin 22 Lactic acid 3.1 Hgb 9.5 Glucose 473 CXR without acute findings   Noted Bradycardic and given atropine 0.533mx 2.    Past Medical History:  Diagnosis Date  . [place]rial fibrillation ([place]   a. maintaining sinus s/p DCCV, on Eliquis  . CAD (coronary artery disease)    a. Nonobstructive by cath 2006 - 25% LAD, 25-30% after 1st diag, distal 25% PDA, minor irreg LCx. b. Normal nuc 2011.  . CKD (chronic kidney disease), stage III   . Cystocele   . Difficult intubation 09-07-2012   big neck trouble with intubation parotid surgery"takes little med to sedate"  . Esophageal stricture   . GERD (gastroesophageal reflux disease)   . Hiatal hernia    a. 07/2013: HH with small stricture holding up barium tablet (concides with patient's sx of food sticking).  . History of kidney stones   . Hyperlipidemia   . Hypertension   . IBS (irritable bowel syndrome)   . Memory difficulty 11/26/2015  . Morbid obesity ([place]   a. hx of gastric bypass (sleeve gastrectomy 2015)  . Non-STEMI (non-ST elevated myocardial infarction) ([facility] [place]/10/2019  . Osteoporosis   . 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  . Marland Kitchenericardial effusion    a. Mod by echo 2012 at time of PNA. b. Echo 07/2012:  small pericardial effusion vs fat.  . Pressure ulcer 04/16/2019   right foot  . S/P TAVR (transcatheter aortic valve replacement)   . Severe aortic stenosis   . Sleep apnea     "have mask; don't use it" (02/14/2018)  . TIA (transient ischemic attack) 10/2014; 06/2016   "some memory issues since; daughter says my speech is sometimes different" (02/14/2018)  . Type II diabetes mellitus ([place])     Past Surgical History:  Procedure Laterality Date  . BREATH TEK H PYLORI N/A 07/29/2013   Procedure: [place];  Surgeon: Shann Medal, MD;  Location: [facility];  Service: General;  Laterality: N/A;  . CARDIAC CATHETERIZATION  2006  . CARDIOVERSION N/A 02/16/2018   Procedure: CARDIOVERSION;  Surgeon: Pixie Casino, MD;  Location: [place];  Service: Cardiovascular;  Laterality: N/A;  . CARPAL TUNNEL RELEASE Left 2011  . CATARACT EXTRACTION W/ INTRAOCULAR LENS  IMPLANT, BILATERAL Bilateral 2003   left  . CHOLECYSTECTOMY OPEN  1982  . COLONOSCOPY N/A 07/14/2015   Procedure: COLONOSCOPY;  Surgeon: Ladene Artist, MD;  Location: [facility];  Service: Endoscopy;  Laterality: N/A;  . ESOPHAGOGASTRODUODENOSCOPY N/A 12/27/2013   Procedure: ESOPHAGOGASTRODUODENOSCOPY (EGD);  Surgeon: Shann Medal, MD;  Location: [facility];  Service: General;  Laterality: N/A;  . ESOPHAGOGASTRODUODENOSCOPY (EGD) WITH ESOPHAGEAL DILATION    . INTRAOPERATIVE TRANSTHORACIC ECHOCARDIOGRAM  05/08/2018   Procedure: INTRAOPERATIVE TRANSTHORACIC ECHOCARDIOGRAM;  Surgeon: Burnell Blanks, MD;  Location: [place];  Service: Open Heart Surgery;;  . IR FLUORO GUIDE CV LINE RIGHT  04/25/2019  . IR REMOVAL TUN CV CATH W/O FL  06/20/2019  . IR US GUIDE VASC ACCESS RIGHT  04/25/2019  . LAPAROSCOPIC GASTRIC SLEEVE RESECTION N/A 01/20/2014   Procedure: LAPAROSCOPIC GASTRIC SLEEVE RESECTION;  Surgeon: Shann Medal, MD;  [facility];  Service: General;  Laterality: N/A;  . PAROTIDECTOMY  09/07/2012   Procedure: PAROTIDECTOMY;  Surgeon: Melida Quitter, MD;  Location: [place];  Service: ENT;  Laterality: Left;  LEFT PAROTIDECTOMY  . RIGHT HEART CATH N/A 09/25/2018   Procedure: RIGHT HEART CATH;  Surgeon: Larey Dresser, MD;  Location: [place] [facility];  Service: Cardiovascular;  Laterality: N/A;  . RIGHT/LEFT HEART CATH AND CORONARY ANGIOGRAPHY N/A 03/29/2018   Procedure: RIGHT/LEFT HEART CATH AND CORONARY ANGIOGRAPHY;  Surgeon: Burnell Blanks, MD;  Location: [place] [facility];  Service: Cardiovascular;   Laterality: N/A;  . TEE WITHOUT CARDIOVERSION N/A 02/16/2018   Procedure: TRANSESOPHAGEAL ECHOCARDIOGRAM (TEE);  Surgeon: Pixie Casino, MD;  Location: [place];  Service: Cardiovascular;  Laterality: N/A;  . TEE WITHOUT CARDIOVERSION N/A 06/05/2019   Procedure: TRANSESOPHAGEAL ECHOCARDIOGRAM (TEE);  Surgeon: Sanda Klein, MD;  Location: [place];  Service: Cardiovascular;  Laterality: N/A;  . TEE WITHOUT CARDIOVERSION N/A 04/24/2019   Procedure: TRANSESOPHAGEAL ECHOCARDIOGRAM (TEE);  Surgeon: Sanda Klein, MD;  Location: [place];  Service: Cardiovascular;  Laterality: N/A;  . TOE AMPUTATION Left 07/2017   great toe; [facility]   . TONSILLECTOMY  1968  . TRANSCATHETER AORTIC VALVE REPLACEMENT, TRANSFEMORAL  05/08/2018   Transcatheter Aortic Valve Replacement   . TRANSCATHETER AORTIC VALVE REPLACEMENT, TRANSFEMORAL N/A 05/08/2018   Procedure: TRANSCATHETER AORTIC VALVE REPLACEMENT, TRANSFEMORAL;  Surgeon: Burnell Blanks, MD;  Location: [place];  Service: Open Heart Surgery;  Laterality: N/A;  . TUBAL LIGATION  yrs ago  . UPPER GI ENDOSCOPY  01/20/2014   Procedure: UPPER GI ENDOSCOPY;  Surgeon: Shann Medal, MD;  [facility];  Service: General;;     Inpatient Medications: Scheduled Meds:  Continuous Infusions: . sodium chloride     PRN Meds: sodium chloride  Allergies:    Allergies  Allergen Reactions  . Amlodipine Swelling    Ankle swelling  . Atorvastatin Other (See Comments)    Myalgias   . Crestor [Rosuvastatin Calcium] Other (See Comments)    Stiffness and back pain  . Ezetimibe-Simvastatin Other (See Comments)    Leg cramps  . Sitagliptin Other (See Comments)  . Statins Other (See Comments)    Myalgias  . Celebrex [Celecoxib] Rash  . Levemir [Insulin Detemir] Rash    Social History:   Social History   Socioeconomic History  . Marital status: Married    Spouse name: Not on file  . Number of children: 3  . Years of education: some  college  . Highest education level: Some college, no degree  Occupational History  . Occupation: retired    Fish farm manager: [employer]  Tobacco Use  . Smoking status: Never Smoker  . Smokeless tobacco: Never Used  Vaping Use  . Vaping Use: Never used  Substance and Sexual Activity  . Alcohol use: Yes    Alcohol/week: 0.0 standard drinks    Comment:  "mixed drink q few years"  . Drug use: Never  . Sexual activity: Yes    Birth control/protection: Post-menopausal  Other Topics Concern  . Not on file  Social History Narrative   Married   Patient is right handed.   Patient rarely drinks caffeine.   Lives at home with husband in two story home. Husband washes laundry in the basement. They have 3 daughters and 7 grandchildren.    Social Determinants of Health   Financial Resource Strain:   . Difficulty of Paying Living Expenses:   Food Insecurity:   . Worried About Charity fundraiser in the Last Year:   . Arboriculturist in the Last Year:   Transportation Needs:   . Film/video editor (Medical):   Marland Kitchen Lack of Transportation (Non-Medical):   Physical Activity:   . Days of Exercise per Week:   . Minutes of Exercise per Session:   Stress:   . Feeling of Stress :   Social Connections:   . Frequency of Communication with Friends and Family:   . Frequency of Social Gatherings with Friends and Family:   . Attends Religious Services:   . Active Member of Clubs or Organizations:   . Attends Archivist Meetings:   Marland Kitchen Marital Status:   Intimate Partner Violence:   . Fear of Current or Ex-Partner:   . Emotionally Abused:   Marland Kitchen Physically Abused:   . Sexually Abused:     Family History:   Family History  Problem Relation Age of Onset  . Heart failure Mother   . Hypertension Mother   . Skin cancer Mother   . Colitis Mother   . Diabetes Mother   . Kidney Stones Mother   . Stroke Mother   . Heart disease Father   . Colon polyps Father   . Diabetes Father   . Heart  failure Father   . Heart attack Sister   . Diabetes Other   . Stroke Maternal Grandfather   . Diabetes Maternal Grandfather   . Colon cancer Neg Hx      ROS:  Please see the history of present illness.  All other ROS reviewed and negative.  Physical Exam/Data:   Vitals:   02/19/20 1345 02/19/20 1356 02/19/20 1400 02/19/20 1411  BP: (!) 115/48  (!) 110/43   Pulse: (!) 42  (!) 43 (!) 46  Resp: _0 Temp:      SpO2: 98%  95% 96%  Weight:  95.3 kg    Height:  _1  (1.702 m)     No intake or output data in the 24 hours ending 02/19/20 1434 Last 3 Weights 02/19/2020 01/24/2020 01/17/2020  Weight (lbs) 210 lb 208 lb 9.6 oz 205 lb 9.6 oz  Weight (kg) 95.255 kg 94.62 kg 93.26 kg     Body mass index is 32.89 kg/m.  General:  Obese female in no acute distress HEENT: normal Lymph: no adenopathy Neck: no JVD Endocrine:  No thryomegaly Vascular: No carotid bruits; FA pulses 2+ bilaterally without bruits  Cardiac:  normal S1, S2; RRR; no murmur  Lungs:  clear to auscultation bilaterally, no wheezing, rhonchi or rales  Abd: soft, nontender, no hepatomegaly  Ext: no edema Musculoskeletal:  No deformities, BUE and BLE strength normal and equal Skin: warm and dry  Neuro: no focal abnormalities noted, Lethrrgic Psych:  lethargic but answer question appropriately   EKG:  The EKG was personally reviewed and demonstrates:  Junctional bradycardia at rate of 34 bpm Telemetry:  Telemetry was personally reviewed and demonstrates:  Sinus/jactional bradycardia in 40s  Relevant CV Studies:  Echo 11/2019 1. Normal LV systolic function; moderate LVH; grade 2 diastolic  dsyfunction with elevated LV filling pressure; s/p AVR with normal mean  gradient of 16 mmHg and no AI; AVA 1.6 cm2; MAC with mild MS (mean  gradient 5 mmHg and MVA 2 cm2).  2. Left ventricular ejection fraction, by estimation, is 60 to 65%. The  left ventricle has normal function. The left ventricle has no regional    wall motion abnormalities. There is moderate left ventricular hypertrophy.  Left ventricular diastolic  parameters are consistent with Grade II diastolic dysfunction  (pseudonormalization). Elevated left atrial pressure.  3. Right ventricular systolic function is normal. The right ventricular  size is normal.  4. The mitral valve is abnormal. Trivial mitral valve regurgitation. Mild  mitral stenosis.  5. The aortic valve has been repaired/replaced. Aortic valve  regurgitation is not visualized. No aortic stenosis is present. There is a  Transport planner prosthetic (TAVR) valve present in the aortic position.  6. The inferior vena cava is dilated in size with >50% respiratory  variability, suggesting right atrial pressure of 8 mmHg.   Cath in 2019  Ost RCA to Prox RCA lesion is 50% stenosed.  Mid RCA lesion is 40% stenosed.  Mid RCA to Dist RCA lesion is 20% stenosed.  Ost RPDA to RPDA lesion is 40% stenosed.  Ost Cx lesion is 50% stenosed.  Prox Cx lesion is 65% stenosed.  Mid Cx lesion is 70% stenosed.  Ost 3rd Mrg lesion is 20% stenosed.  Mid LM to Dist LM lesion is 25% stenosed.  Mid LAD to Dist LAD lesion is 99% stenosed.  Ost LAD to Prox LAD lesion is 20% stenosed.  Hemodynamic findings consistent with moderate pulmonary hypertension.  1. Moderate disease in the ostium of the RCA and in the mid RCA.  2. Moderate disease in the proximal segment of the Circumflex and in the distal OM branch.  3. Mild proximal LAD stenosis. The distal LAD tapers to a small caliber vessel and is diffusely diseased with 99% stenosis, too small  for PCI.  4. Elevated filling pressures.  5. Moderately severe to Severe aortic stenosis (mean gradient 29.6 mmHg, peak to peak gradient 34 mmHg, AVA 1.11 cm2). Echo images demonstrate a heavily calcified and thickened valve with fusion of two of the leaflets. I think her aortic stenosis is in the severe range.   Recommendations: Medical  management of CAD. Will continue with workup for TAVR vs AVR. I suspect that she will be a better candidate for TAVR given her co-morbidities. She may benefit from diuresis.    Laboratory Data:  High Sensitivity Troponin:   Recent Labs  Lab 02/19/20 1330  TROPONINIHS 22*     Chemistry Recent Labs  Lab 02/19/20 1330  NA 138  K 5.0  CL 106  CO2 20*  GLUCOSE 473*  BUN 62*  CREATININE 3.29*  CALCIUM 8.0*  GFRNONAA 14*  GFRAA 16*  ANIONGAP 12    Hematology Recent Labs  Lab 02/19/20 1330  WBC 7.8  RBC 4.00  HGB 9.5*  HCT 32.5*  MCV 81.3  MCH 23.8*  MCHC 29.2*  RDW 15.4  PLT 195   Radiology/Studies:  DG Chest Portable 1 View  Result Date: 02/19/2020 CLINICAL DATA:  69 year old female with bradycardia. Recent myocardial infarction. EXAM: PORTABLE CHEST 1 VIEW COMPARISON:  Portable chest 01/16/2020 and earlier. FINDINGS: Portable AP semi upright view at 1351 hours. Stable cardiomegaly. Stable lung volumes. Pacer or resuscitation pads project over the left lower chest now. Allowing for portable technique the lungs are clear. No pneumothorax. Negative trachea. IMPRESSION: Stable cardiomegaly. No acute cardiopulmonary abnormality. Electronically Signed   By: Genevie Ann M.D.   On: 02/19/2020 14:25   Assessment and Plan:   1.  Sick sinus syndrome Patient has history of symptomatic persistent atrial fibrillation with rapid ventricular rate.  She is on high dose of amiodarone 400 mg twice daily and Coreg 25 mg twice daily.  Now presenting with symptomatic bradycardia.  She is very lethargic.  EKG with junctional rhythm in 30s to 40s.  No improvement despite atropine 0.5 mgx 2.   2.  Persistent atrial fibrillation -Patient has multiple admission and evaluation secondary to symptomatic A. fib RVR -She is on amiodarone 400 mg twice daily and Coreg 25 mg twice daily -On Eliquis for anticoagulation -Recently established care with Dr. Rayann Heman who did not felt good candidate for ablation  given co-morbidities  3.  Elevated troponin/CAD -Patient has history of medically managed CAD with nearly occluded distal LAD, vessel too small for PCI -High-sensitivity troponin was 9090 when admitted 01/2020 in setting of A. fib RVR.  Felt her symptoms demand due to elevated heart rate.  She did had chest pain in setting of elevated rate. -High-sensitivity troponin 22 this admission.  Denies chest pain.  4.  Diabetes mellitus with hypoglycemia -Patient and daughter reported intermittent hypoglycemia for past couple of days in 81s -This morning her blood sugar was 40 however almost 500 here.  5. CKD IV-V -Creatinine around 2.5 -Creatinine has increased above 3 range since June 2021  6. S/p TAVR -Last echocardiogram April 2021 showed normal mid gradient of 16 mmHg, no AI  7.  Chronic diastolic heart failure/TTR amyloidosis - Weight 210lb. Was 205lb on 01/17/2020.  - CXR without acute abnormality - No volume overload on exam.   Discussed and reviewed with Dr. Marlou Porch.  Stop amiodarone and Coreg.  Recommended internal medicine admission. Will ask EP if she need temp wire or not.    For questions or updates, please contact  [facility] Please consult [email or web address] for contact info under    Jarrett Soho, PA  02/19/2020 2:34 PM   Personally seen and examined. Agree with above.   69 year old with complex medical history including recent hospitalization for atrial fibrillation rapid ventricular response in the setting of acute diastolic heart failure with troponin as high as 9000, known CAD not amenable to PCI, recently seen by Dr. Rayann Heman in consultation here with lethargy, difficult to control blood sugar, bradycardia.  In the emergency department, heart rate was originally in the 30s.  Pacer pads were placed.  Atropine was administered twice.  When I was interviewing her, she was mentating well, breathing comfortably.  Blood pressure was 992 systolic.  Heart rate was 45  bpm, junctional escape.  EKG personally reviewed does not show any clear evidence of atrial fibrillation underlying, regular escape, QRS duration 116 bpm.  Recent echocardiogram showed EF of 65%.  Diastolic dysfunction.  Current creatinine 3.29, 2 months ago 2.69.  GEN: Obese well nourished, well developed, in no acute distress  HEENT: normal  Neck: no JVD, carotid bruits, or masses Cardiac: Bradycardic regular; no murmurs, rubs, or gallops,no edema  Respiratory:  clear to auscultation bilaterally, normal work of breathing GI: soft, nontender, nondistended, + BS MS: no deformity or atrophy  Skin: warm and dry, no rash Neuro:  Alert and Oriented x 3, Strength and sensation are intact Psych: euthymic mood, full affect  Assessment and plan:  Symptomatic bradycardia -We will hold amiodarone 400 mg twice daily, carvedilol 25 mg twice a day -At this time blood pressure normal.  Seems to be mentating well with me.  No significant shortness of breath.  Obviously if heart rate deteriorates any further, temporary pacemaker wire will be needed. -I have consulted electrophysiology for further advisement.  Question if she may need permanent pacemaker given her recent rapid ventricular response atrial fibrillation.  Will not tolerate higher heart rates.  Has anginal symptoms associated with these.  Had demand ischemia as well.  Fairly significant troponin elevation at last hospitalization.  Diabetes with hypo and hyperglycemia. -We will have primary team help [place] with this.  CKD 4, close to 5 -Complicating matters as well.  Holding torsemide.  This may be related as well, at least recent acute kidney injury, to bradycardia.  Decreased cardiac output.  Status post TAVR -Gradient 16 mmHg no AI.  Normal EF.  T TR amyloidosis -No volume overload on exam.  Dr. Aundra Dubin has been contacted and will see tomorrow.  Candee Furbish, MD

## 2020-02-19 NOTE — ED Triage Notes (Signed)
Pt bib ems from home for difficulty breathing and weakness for approximately a week. Pt gained 12 lbs, cardiologist increased lasix and then decreased d/t poor kidney function. Today pt to hydralazine, labetalol, amio all together at 0845 today, normally she doesn't take all three together. CBG 40's at first check this morning and then 490 by ems. A&O x4, but lethargic. Given 4mg  Zofran for N enroute.

## 2020-02-19 NOTE — ED Notes (Signed)
Patient returned from CT

## 2020-02-19 NOTE — ED Notes (Signed)
Pt appears to be resting w/o any signs of distress at this time.

## 2020-02-19 NOTE — ED Notes (Signed)
RN has paged IM Resident r/t order parameters with HR. Pt HR is currently in the mid 40's , orders state to contact providers for pulse less than 55. RN awaiting call back

## 2020-02-19 NOTE — ED Notes (Signed)
Notified EDP of slight hr decrease when sleeping.

## 2020-02-19 NOTE — ED Provider Notes (Signed)
[facility] EMERGENCY DEPARTMENT Provider Note   CSN: [id number] Arrival date & time: 02/19/20  1257    History Bradycardia  [name] is a 69 y.o. female with significant past medical history including recent NSTEMI who presents for evaluation of bradycardia.  Daughter states patient has had generalized weakness over the last week.  Has had some difficulty breathing at night however is noncompliant with her CPAP.  Daughter states that patient has gained 12 pounds within the last few days.  Cardiologist had originally increased her Lasix her then had to decrease this due to worsening kidney function.  Patient noted to have elevated blood pressure today so she took her hydralazine, labetalol as well as amiodarone together this morning.  Per daughter patient normally takes these spread out.  Patient denies any current pain.  Significant other check patient's blood sugar which was in the 40s this morning.  Rechecked by EMS at 490.  Patient noted to be ANO x4 however lethargic.  She was given 4 mg of Zofran in route for nausea.  Daughter states patient abdomen is distended however this is at baseline.  Denies fever, chills, headache, chest pain, shortness of breath abdominal pain, diarrhea, dysuria.  No unilateral leg swelling, redness or warmth.  Has right amputation to left lower extremity.  No rashes or lesions per family.  Denies additional aggravating or relieving factors.  History obtained from patient and past medical records. No interpreter is used.  HPI     Past Medical History:  Diagnosis Date  . Atrial fibrillation ([place])    a. maintaining sinus s/p DCCV, on Eliquis  . CAD (coronary artery disease)    a. Nonobstructive by cath 2006 - 25% LAD, 25-30% after 1st diag, distal 25% PDA, minor irreg LCx. b. Normal nuc 2011.  . CKD (chronic kidney disease), stage III   . Cystocele   . Difficult intubation 09-07-2012   big neck trouble with intubation parotid  surgery"takes little med to sedate"  . Esophageal stricture   . GERD (gastroesophageal reflux disease)   . Hiatal hernia    a. 07/2013: HH with small stricture holding up barium tablet (concides with patient's sx of food sticking).  . History of kidney stones   . Hyperlipidemia   . Hypertension   . IBS (irritable bowel syndrome)   . Memory difficulty 11/26/2015  . Morbid obesity ([place])    a. hx of gastric bypass (sleeve gastrectomy 2015)  . Non-STEMI (non-ST elevated myocardial infarction) ([place]) 01/16/2020  . Osteoporosis   . 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  Marland Kitchen Pericardial effusion    a. Mod by echo 2012 at time of PNA. b. Echo 07/2012: small pericardial effusion vs fat.  . Pressure ulcer 04/16/2019   right foot  . S/P TAVR (transcatheter aortic valve replacement)   . Severe aortic stenosis   . Sleep apnea     "have mask; don't use it" (02/14/2018)  . TIA (transient ischemic attack) 10/2014; 06/2016   "some memory issues since; daughter says my speech is sometimes different" (02/14/2018)  . Type II diabetes mellitus [facility])     Patient Active Problem List   Diagnosis Date Noted  . Hospital discharge follow-up 01/24/2020  . Non-STEMI (non-ST elevated myocardial infarction) ([place]) 01/16/2020  . NSTEMI (non-ST elevated myocardial infarction) ([place]) 01/16/2020  . Acute on chronic diastolic heart failure ([place]) 12/09/2019  . S/P transmetatarsal amputation of foot, left ([place]) 10/23/2019  . Subacute bacterial endocarditis  05/21/2019  . 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 Bacteremia due to Enterococcus   . Pressure injury of skin 04/16/2019  . Infected wound 04/16/2019  . Weakness generalized 04/15/2019  . SIRS (systemic inflammatory response syndrome) ([place]) 04/15/2019  . Anasarca 09/19/2018  . Acute encephalopathy 06/16/2018  . 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 Acute on chronic diastolic CHF (congestive heart failure) ([place]) 05/10/2018  . Morbid obesity  ([place])   . Hypertension associated with diabetes ([place])   . CKD (chronic kidney disease), stage III   . Sleep apnea   . Hyperlipidemia   . GERD (gastroesophageal reflux disease)   . Persistent atrial fibrillation with rapid ventricular response ([place])   . S/P TAVR (transcatheter aortic valve replacement)   . Severe aortic stenosis   . Vertigo 05/06/2015  . TIA (transient ischemic attack), history of 11/07/2014  . 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 CKD stage 3 due to type 2 diabetes mellitus ([place]) 06/03/2014  . Type II diabetes mellitus with renal manifestations ([place]) 06/03/2014  . Type II diabetes mellitus with peripheral circulatory disorder ([place]) 06/03/2014  . Hyperlipidemia associated with type 2 diabetes mellitus ([place]) 12/26/2008  . Benign essential HTN 12/26/2008  . Coronary artery disease, non-occlusive 12/26/2008  . Pericardial effusion 12/26/2008    Past Surgical History:  Procedure Laterality Date  . BREATH TEK H PYLORI N/A 07/29/2013   Procedure: [place];  Surgeon: Shann Medal, MD;  Location: [facility];  Service: General;  Laterality: N/A;  . CARDIAC CATHETERIZATION  2006  . CARDIOVERSION N/A 02/16/2018   Procedure: CARDIOVERSION;  Surgeon: Pixie Casino, MD;  Location: [facility];  Service: Cardiovascular;  Laterality: N/A;  . CARPAL TUNNEL RELEASE Left 2011  . CATARACT EXTRACTION W/ INTRAOCULAR LENS  IMPLANT, BILATERAL Bilateral 2003   left  . CHOLECYSTECTOMY OPEN  1982  . COLONOSCOPY N/A 07/14/2015   Procedure: COLONOSCOPY;  Surgeon: Ladene Artist, MD;  Location: [facility];  Service: Endoscopy;  Laterality: N/A;  . ESOPHAGOGASTRODUODENOSCOPY N/A 12/27/2013   Procedure: ESOPHAGOGASTRODUODENOSCOPY (EGD);  Surgeon: Shann Medal, MD;  Location: [facility];  Service: General;  Laterality: N/A;  . ESOPHAGOGASTRODUODENOSCOPY (EGD) WITH ESOPHAGEAL DILATION    . INTRAOPERATIVE TRANSTHORACIC ECHOCARDIOGRAM  05/08/2018   Procedure: INTRAOPERATIVE  TRANSTHORACIC ECHOCARDIOGRAM;  Surgeon: Burnell Blanks, MD;  Location: [facility];  Service: Open Heart Surgery;;  . IR FLUORO GUIDE CV LINE RIGHT  04/25/2019  . IR REMOVAL TUN CV CATH W/O FL  06/20/2019  . IR US GUIDE VASC ACCESS RIGHT  04/25/2019  . LAPAROSCOPIC GASTRIC SLEEVE RESECTION N/A 01/20/2014   Procedure: LAPAROSCOPIC GASTRIC SLEEVE RESECTION;  Surgeon: Shann Medal, MD;  Location: [facility];  Service: General;  Laterality: N/A;  . PAROTIDECTOMY  09/07/2012   Procedure: PAROTIDECTOMY;  Surgeon: Melida Quitter, MD;  Location: [facility];  Service: ENT;  Laterality: Left;  LEFT PAROTIDECTOMY  . RIGHT HEART CATH N/A 09/25/2018   Procedure: RIGHT HEART CATH;  Surgeon: Larey Dresser, MD;  Location: [facility];  Service: Cardiovascular;  Laterality: N/A;  . RIGHT/LEFT HEART CATH AND CORONARY ANGIOGRAPHY N/A 03/29/2018   Procedure: RIGHT/LEFT HEART CATH AND CORONARY ANGIOGRAPHY;  Surgeon: Burnell Blanks, MD;  Location: [facility];  Service: Cardiovascular;  Laterality: N/A;  . TEE WITHOUT CARDIOVERSION N/A 02/16/2018   Procedure: TRANSESOPHAGEAL ECHOCARDIOGRAM (TEE);  Surgeon: Pixie Casino, MD;  Location: [facility];  Service: Cardiovascular;  Laterality: N/A;  . TEE WITHOUT CARDIOVERSION N/A  06/05/2019   Procedure: TRANSESOPHAGEAL ECHOCARDIOGRAM (TEE);  Surgeon: Sanda Klein, MD;  Location: [facility];  Service: Cardiovascular;  Laterality: N/A;  . TEE WITHOUT CARDIOVERSION N/A 04/24/2019   Procedure: TRANSESOPHAGEAL ECHOCARDIOGRAM (TEE);  Surgeon: Sanda Klein, MD;  Location: [facility];  Service: Cardiovascular;  Laterality: N/A;  . TOE AMPUTATION Left 07/2017   great toe; [facility]   . TONSILLECTOMY  1968  . TRANSCATHETER AORTIC VALVE REPLACEMENT, TRANSFEMORAL  05/08/2018   Transcatheter Aortic Valve Replacement   . TRANSCATHETER AORTIC VALVE REPLACEMENT, TRANSFEMORAL N/A 05/08/2018   Procedure: TRANSCATHETER AORTIC VALVE REPLACEMENT, TRANSFEMORAL;  Surgeon:  Burnell Blanks, MD;  Location: [facility];  Service: Open Heart Surgery;  Laterality: N/A;  . TUBAL LIGATION  yrs ago  . UPPER GI ENDOSCOPY  01/20/2014   Procedure: UPPER GI ENDOSCOPY;  Surgeon: Shann Medal, MD;  Location: [facility];  Service: General;;     OB History    Gravida  3   Para  3   Term  3   Preterm      AB      Living  3     SAB      TAB      Ectopic      Multiple      Live Births  3           Family History  Problem Relation Age of Onset  . Heart failure Mother   . Hypertension Mother   . Skin cancer Mother   . Colitis Mother   . Diabetes Mother   . Kidney Stones Mother   . Stroke Mother   . Heart disease Father   . Colon polyps Father   . Diabetes Father   . Heart failure Father   . Heart attack Sister   . Diabetes Other   . Stroke Maternal Grandfather   . Diabetes Maternal Grandfather   . Colon cancer Neg Hx     Social History   Tobacco Use  . Smoking status: Never Smoker  . Smokeless tobacco: Never Used  Vaping Use  . Vaping Use: Never used  Substance Use Topics  . Alcohol use: Yes    Alcohol/week: 0.0 standard drinks    Comment:  "mixed drink q few years"  . Drug use: Never    Home Medications Prior to Admission medications   Medication Sig Start Date End Date Taking? Authorizing Provider  ACCU-CHEK AVIVA PLUS test strip USE TO CHECK BLOOD SUGAR UP TO TWICE DAILY OR AS INSTRUCTED 09/04/17   Chipper Herb, MD  acetaminophen (TYLENOL) 500 MG tablet Take 1,000 mg by mouth as needed for mild pain.     [provider]  amiodarone (PACERONE) 400 MG tablet Take 1 tablet (400 mg total) by mouth 2 (two) times daily. 01/17/20   Furth, Cadence H, PA-C  B-D INS SYR ULTRAFINE 1CC/31G 31G X 5/16" 1 ML MISC USE TO INJECT INSULIN TWICE A DAY AS INSTRUCTED Patient taking differently: 1 Stick by Other route 3 (three) times daily before meals.  11/16/15   Chipper Herb, MD  Calcium Carbonate (CALCIUM 600 PO) Take 1 tablet by mouth  daily.    [provider]  carvedilol (COREG) 25 MG tablet Take 25 mg by mouth 2 (two) times daily with a meal.    [provider]  Cholecalciferol (VITAMIN D3 PO) 2 gummies by mouth daily    [provider]  Cyanocobalamin (VITAMIN B-12 PO) Take by mouth. 2 gummies by mouth  daily    [provider]  ELIQUIS 5 MG TABS tablet TAKE 1 TABLET BY MOUTH TWICE A DAY 02/03/20   Minus Breeding, MD  fenofibrate (TRICOR) 145 MG tablet TAKE 1 TABLET BY MOUTH EVERY DAY Patient taking differently: Take 145 mg by mouth daily.  12/16/19   Larey Dresser, MD  hydrALAZINE (APRESOLINE) 10 MG tablet Take 10 mg by mouth 2 (two) times daily.     [provider]  insulin NPH Human (NOVOLIN N) 100 UNIT/ML injection Inject 25-50 Units into the skin See admin instructions. 50 units in the am 60 units at lunch and 20 units for supper - on a sliding scale    [provider]  Insulin Pen Needle 32G X 4 MM MISC Use to inject insulin with insulin pen 05/17/16   Chipper Herb, MD  LIVALO 4 MG TABS Take 4 mg by mouth every evening.    [provider]  meclizine (ANTIVERT) 12.5 MG tablet Take 1 tablet (12.5 mg total) by mouth 3 (three) times daily as needed for dizziness. 06/14/19   Larey Dresser, MD  Multiple Vitamins-Minerals (MULTIVITAMIN GUMMIES ADULT PO) Taking two gummies by mouth daily    [provider]  nutrition supplement, JUVEN, (JUVEN) PACK Take 1 packet by mouth 2 (two) times daily between meals. 04/26/19   Swayze, Ava, DO  ondansetron (ZOFRAN ODT) 4 MG disintegrating tablet Take 1 tablet (4 mg total) by mouth every 8 (eight) hours as needed for nausea. Patient taking differently: Take 4 mg by mouth as needed for nausea.  01/03/17   Larene Pickett, PA-C  PRALUENT 75 MG/ML SOAJ INJECT 75 MG INTO THE SKIN EVERY 14 (FOURTEEN) DAYS. 11/18/19   Larey Dresser, MD  RELION INSULIN SYRINGE 31G X 15/64" 1 ML MISC Inject 1 Syringe into the skin 3 (three)  times daily. 01/04/20   [provider]  Tafamidis (VYNDAMAX) 61 MG CAPS Take 61 mg by mouth daily. 07/29/19   Larey Dresser, MD  torsemide (DEMADEX) 20 MG tablet Take 1 tablet (20 mg total) by mouth daily. 02/13/20   Larey Dresser, MD  VASCEPA 1 g capsule TAKE 2 CAPSULES (2 G TOTAL) BY MOUTH 2 (TWO) TIMES A DAY. 01/16/20   Larey Dresser, MD  Olmesartan-Amlodipine-HCTZ [facility]) 40-5-12.5 MG TABS Take 1 tablet by mouth daily.    04/12/18  [provider]    Allergies    Amlodipine, Atorvastatin, Crestor [rosuvastatin calcium], Ezetimibe-simvastatin, Sitagliptin, Statins, Celebrex [celecoxib], and Levemir [insulin detemir]  Review of Systems   Review of Systems  Constitutional: Positive for activity change, appetite change and fatigue.  HENT: Negative.   Respiratory: Positive for cough and shortness of breath. Negative for apnea, choking, chest tightness, wheezing and stridor.   Cardiovascular: Negative.   Gastrointestinal: Positive for nausea. Negative for abdominal distention, abdominal pain, anal bleeding, blood in stool, constipation, diarrhea, rectal pain and vomiting.  Genitourinary: Negative.   Musculoskeletal: Positive for back pain (Chronic).  Skin: Negative.   Neurological: Positive for weakness (Generalized). Negative for dizziness, tremors, syncope, facial asymmetry, light-headedness, numbness and headaches.  All other systems reviewed and are negative.   Physical Exam Updated Vital Signs BP 122/62   Pulse (!) 36   Temp 98 F (36.7 C)   Resp 17   Ht 5\' 7"  (1.702 m)   Wt 95.3 kg   SpO2 100%   BMI 32.89 kg/m   Physical Exam Vitals and nursing note reviewed.  Constitutional:  General: She is not in acute distress.    Appearance: She is well-developed. She is ill-appearing (Chronically ill appearing). She is not toxic-appearing or diaphoretic.     Comments: Sleepy however arousal to voice  HENT:     Head: Normocephalic and atraumatic.      Nose: Nose normal.     Mouth/Throat:     Mouth: Mucous membranes are dry.  Eyes:     Pupils: Pupils are equal, round, and reactive to light.  Cardiovascular:     Rate and Rhythm: Regular rhythm. Bradycardia present.     Pulses: Normal pulses.          Radial pulses are 2+ on the right side and 2+ on the left side.       Dorsalis pedis pulses are 2+ on the right side and 2+ on the left side.     Heart sounds: Normal heart sounds.     Comments: Bradycardic to 30's Pulmonary:     Effort: Pulmonary effort is normal. No respiratory distress.  Abdominal:     General: There is distension.     Palpations: Abdomen is soft.     Tenderness: There is no abdominal tenderness. There is no right CVA tenderness, left CVA tenderness, guarding or rebound.     Hernia: No hernia is present.  Musculoskeletal:        General: Normal range of motion.     Cervical back: Normal range of motion and neck supple.     Comments: Moves all 4 extremities without difficulty. Homans sign negative. No bony tenderness.  TransMetatarsal amputation to left lower extremity  Skin:    General: Skin is warm and dry.     Capillary Refill: Capillary refill takes 2 to 3 seconds.     Coloration: Skin is pale.  Neurological:     Comments: Sleepy however arousable to voice.  No facial droop.  Intact sensation bilaterally.  Equal handgrip bilaterally    ED Results / Procedures / Treatments   Labs (all labs ordered are listed, but only abnormal results are displayed) Labs Reviewed  CBC WITH DIFFERENTIAL/PLATELET - Abnormal; Notable for the following components:      Result Value   Hemoglobin 9.5 (*)    HCT 32.5 (*)    MCH 23.8 (*)    MCHC 29.2 (*)    nRBC 0.3 (*)    Abs Immature Granulocytes 0.08 (*)    All other components within normal limits  LACTIC ACID, PLASMA - Abnormal; Notable for the following components:   Lactic Acid, Venous 3.1 (*)    All other components within normal limits  BRAIN NATRIURETIC PEPTIDE -  Abnormal; Notable for the following components:   B Natriuretic Peptide 330.5 (*)    All other components within normal limits  BASIC METABOLIC PANEL - Abnormal; Notable for the following components:   CO2 20 (*)    Glucose, Bld 473 (*)    BUN 62 (*)    Creatinine, Ser 3.29 (*)    Calcium 8.0 (*)    GFR calc non Af Amer 14 (*)    GFR calc Af Amer 16 (*)    All other components within normal limits  PROTIME-INR - Abnormal; Notable for the following components:   Prothrombin Time 19.9 (*)    INR 1.8 (*)    All other components within normal limits  CBG MONITORING, ED - Abnormal; Notable for the following components:   Glucose-Capillary 385 (*)    All other components within  normal limits  TROPONIN I (HIGH SENSITIVITY) - Abnormal; Notable for the following components:   Troponin I (High Sensitivity) 22 (*)    All other components within normal limits  TROPONIN I (HIGH SENSITIVITY) - Abnormal; Notable for the following components:   Troponin I (High Sensitivity) 21 (*)    All other components within normal limits  SARS CORONAVIRUS 2 BY RT PCR (HOSPITAL ORDER, [place] LAB)  LACTIC ACID, PLASMA  URINALYSIS, ROUTINE W REFLEX MICROSCOPIC    EKG None  Radiology CT Head Wo Contrast  Result Date: 02/19/2020 CLINICAL DATA:  Encephalopathy EXAM: CT HEAD WITHOUT CONTRAST TECHNIQUE: Contiguous axial images were obtained from the base of the skull through the vertex without intravenous contrast. COMPARISON:  04/15/2019 FINDINGS: Brain: New small to moderate size area of hypoattenuation within the left cerebellar hemisphere. 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 There is no acute intracranial hemorrhage or significant mass effect. There is no extra-axial fluid collection. Ventricles and sulci are within normal limits in size and configuration. Vascular: There is atherosclerotic calcification at the skull base. Skull: Calvarium is unremarkable. Sinuses/Orbits:  No acute finding. Other: None. IMPRESSION: New area of hypoattenuation within the left cerebellar hemisphere, which may reflect an age-indeterminate infarct. Suggest MRI for further evaluation. Electronically Signed   By: Macy Mis M.D.   On: 02/19/2020 15:36   DG Chest Portable 1 View  Result Date: 02/19/2020 CLINICAL DATA:  69 year old female with bradycardia. Recent myocardial infarction. EXAM: PORTABLE CHEST 1 VIEW COMPARISON:  Portable chest 01/16/2020 and earlier. FINDINGS: Portable AP semi upright view at 1351 hours. Stable cardiomegaly. Stable lung volumes. Pacer or resuscitation pads project over the left lower chest now. Allowing for portable technique the lungs are clear. No pneumothorax. Negative trachea. IMPRESSION: Stable cardiomegaly. No acute cardiopulmonary abnormality. Electronically Signed   By: Genevie Ann M.D.   On: 02/19/2020 14:25    Procedures .Critical Care Performed by: Nettie Elm, PA-C Authorized by: Nettie Elm, PA-C   Critical care provider statement:    Critical care time (minutes):  45   Critical care was necessary to treat or prevent imminent or life-threatening deterioration of the following conditions:  Cardiac failure and circulatory failure   Critical care was time spent personally by me on the following activities:  Discussions with consultants, evaluation of patient's response to treatment, examination of patient, ordering and performing treatments and interventions, ordering and review of laboratory studies, ordering and review of radiographic studies, pulse oximetry, re-evaluation of patient's condition, obtaining history from patient or surrogate and review of old charts   (including critical care time)  Medications Ordered in ED Medications  0.9 %  sodium chloride infusion (has no administration in time range)  insulin aspart (novoLOG) injection 10 Units (has no administration in time range)  atropine 1 MG/10ML injection 0.5 mg (0.5 mg  Intravenous Given 02/19/20 1320)  sodium chloride 0.9 % bolus 500 mL (0 mLs Intravenous Stopped 02/19/20 1511)  atropine 1 MG/10ML injection 0.5 mg (0.5 mg Intravenous Given 02/19/20 1408)  insulin aspart (novoLOG) injection 10 Units (10 Units Subcutaneous Given 02/19/20 1517)    ED Course  I have reviewed the triage vital signs and the nursing notes.  Pertinent labs & imaging results that were available during my care of the patient were reviewed by me and considered in my medical decision making (see chart for details).  70 year old female with extensive past medical history presents for evaluation of generalized weakness and bradycardia.  Noted to be bradycardic into the 30s dropping down to the 20s with EMS.  She is afebrile, nonseptic however chronically ill-appearing. She is on nasal cannula for comfort.  Has had generalized weakness and some shortness of breath at night with increased weight gain of 12 pounds per daughter. Patient sleepy however arousable to voice.  She is ANO x4.  She moves all 4 extremities without difficulty.  No bony tenderness.  No recent trauma.  No clinical evidence of DVT on exam.  No rashes or lesions.  Daughter states patient did have blood sugar into the 40s today however blood sugar with EMS high 400s.  Nonfocal neuro exam.  Does have distended abdomen however nontender. Daughter states this is at baseline.  Give atropine, fluid bolus.  She has stable blood pressure. Cardiology consulted.  Pacer pads placed however will hold on pacing until cardiology has evaluated.  Patient did take 3 of her blood pressure medications including amiodarone, beta-blocker and hydralazine this morning.  These are typically spaced out throughout the day.  Neurovascularly intact.  Plan on labs, imaging and reassess  Labs and imaging personally reviewed and interpreted: CBC without leukocytosis, hemoglobin 9.5 Metabolic panel with hyperglycemia to 473, BUN 62 however similar to prior, creatinine  3.29 mild improvement from previous Troponin  22 however prior was with STEMI Lactic acid 3.1>>1.8 INR 1.8 BNP 330 COVID negative Chest x-ray without infiltrates, pulmonary edema. CT head possible indeterminate left cerebellar infarct, recommend MRI EKG  without STEMI  1410: Patient reassessed.  She became bradycardic again into the 30s when sleeping.  Additional dose of atropine ordered.  Question medication related due to hydralazine, labetalol, amiodarone given together at 9 AM this morning.  1600: Patient reassessed.  Heart rate in high 40s, low 50s.  Appears her baseline is in the 85s.  Patient has normal mentation.  ANO x4.  She has no complaints.  Discussed CT scan with possible MRI.  She does have prosthetic valve however she is unsure if this is MRI capable.   CONSULT: Dr. Marlou Porch, Cardiology who has assessed patient.  They recommend hospitalist admission and they will consult with EP to see if patient needs temporary versus permanent pacemaker.  Cardiology consulted with EP PA [place] who is in evaluating patient. See his note for further recommendations.  Patient critically ill with symptomatic bradycardia requiring multiple doses of IV medications and consultation with cardiology and electrophysiology for possible pacing.  Will be admitted to hospitalist service for continued management.  CBG trending down, baseline at home is high 200s.  Will give additional dose of insulin here.  Low suspicion for DKA.  Pending UA however patient without any urinary complaints.  She is refusing urine cath and is currently on pure wick.  CONSULT with Dr. Roosevelt Locks with [facility] who will evaluate patient for admission.  The patient appears reasonably stabilized for admission considering the current resources, flow, and capabilities available in the ED at this time, and I doubt any other [facility] requiring further screening and/or treatment in the ED prior to admission.  Patient seen evaluated with attending, Dr.  Stark Jock who agrees with above treatment, plan and disposition.   MDM Rules/Calculators/A&P                           Final Clinical Impression(s) / ED Diagnoses Final diagnoses:  Bradycardia  Hyperglycemia  Chronic kidney disease, unspecified CKD stage  Elevated brain natriuretic peptide (BNP) level  Elevated troponin  Rx / DC Orders ED Discharge Orders    None       Verma Grothaus A, PA-C 02/19/20 1643    Veryl Speak, MD 02/19/20 1646

## 2020-02-19 NOTE — Consult Note (Addendum)
ELECTROPHYSIOLOGY CONSULT NOTE    Patient ID: [name] MRN: [id number], DOB/AGE: Mar 04, 1951 69 y.o.  Admit date: 02/19/2020 Date of Consult: 02/19/2020  Primary Physician: [name], DO Primary Cardiologist: Minus Breeding, MD  Electrophysiologist: Dr. Rayann Heman   Referring Provider: Dr. Marlou Porch  Patient Profile: [name] is a 69 y.o. 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 Marlou Porch.  HPI:  [name] is a 69 y.o. female with complicated medical history as above.   Pt recently admitted 6/3 - 6/4 with severe palpitations and substernal chest pain up to 8/10. Presented in AF with RVR in the 140s. HS troponin 9090 and thought to be 2/2 adib with RVR in the setting of cath 12/2017 which showed moderate CAD and 99% stenosis of small caliber vessel of the LAD too small for PCI and desire to avoid PCI in setting of CKD III.  Pt was admitted and started on amiodarone with conversion to NSR.   Seen by Dr. Rayann Heman virtually 01/20/2020 and reported doing well. Planned close follow up in AF clinic to monitor amiodarone therapy.   Pt had weight gain 6/29 and called HF clinic, torsemide was temporarily increased but then decreased back down with AKI on CKD IV (Cr up to 3.44)  Pt presented to [facility] today with generalized weakness times 1 week. PT had blood sugar in the 40s this am and EMS called. On their arrival reported glucose of 490 and noted to be lethargic. Also noted to be bradycardic into the 30s so brought into ED. EKG on exam showed junctional rhythm at 34 bpm. She was given atropine x 2 with initially no response, but HR gradually improved to mid 40 and lower 50s.   Pertinent labs on  admission include Hgb 9.5, Glucose 473, Creatinine 3.29 which is near baseline, HS troponin 21, Lactic acid 3.1. COVID negative. CXR without infiltrates or edema.    Echocardiogram 11/2019 60-65%  Currently she is feeling better. She reports mild fatigue over the last 3 days that peaked this morning. She also reports intermittent palpitations, especially at night. It didn't feel quite the same as when her heart is racing. She mainly called EMS due to her sugar being so low. She denies syncope. CHF team has discussed possible cardiomems device but pt is concerned about contrast for procedure with borderline kidney function.   Past Medical History:  Diagnosis Date  . Atrial fibrillation ([place])    a. maintaining sinus s/p DCCV, on Eliquis  . CAD (coronary artery disease)    a. Nonobstructive by cath 2006 - 25% LAD, 25-30% after 1st diag, distal 25% PDA, minor irreg LCx. b. Normal nuc 2011.  . CKD (chronic kidney disease), stage III   . Cystocele   . Difficult intubation 09-07-2012   big neck trouble with intubation parotid surgery"takes little med to sedate"  . Esophageal stricture   . GERD (gastroesophageal reflux disease)   . Hiatal hernia    a. 07/2013: HH with small stricture holding up barium tablet (concides with patient's sx of food sticking).  . History of kidney stones   . Hyperlipidemia   . Hypertension   . IBS (irritable bowel syndrome)   .  Memory difficulty 11/26/2015  . Morbid obesity ([place])    a. hx of gastric bypass (sleeve gastrectomy 2015)  . Non-STEMI (non-ST elevated myocardial infarction) ([place]) 01/16/2020  . Osteoporosis   . 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  Marland Kitchen Pericardial effusion    a. Mod by echo 2012 at time of PNA. b. Echo 07/2012: small pericardial effusion vs fat.  . Pressure ulcer 04/16/2019   right foot  . S/P TAVR (transcatheter aortic valve replacement)   . Severe aortic stenosis   . Sleep apnea     "have mask; don't use it"  (02/14/2018)  . TIA (transient ischemic attack) 10/2014; 06/2016   "some memory issues since; daughter says my speech is sometimes different" (02/14/2018)  . Type II diabetes mellitus ([place])      Surgical History:  Past Surgical History:  Procedure Laterality Date  . BREATH TEK H PYLORI N/A 07/29/2013   Procedure: [place];  Surgeon: Shann Medal, MD;  Location: [facility];  Service: General;  Laterality: N/A;  . CARDIAC CATHETERIZATION  2006  . CARDIOVERSION N/A 02/16/2018   Procedure: CARDIOVERSION;  Surgeon: Pixie Casino, MD;  Location: [place];  Service: Cardiovascular;  Laterality: N/A;  . CARPAL TUNNEL RELEASE Left 2011  . CATARACT EXTRACTION W/ INTRAOCULAR LENS  IMPLANT, BILATERAL Bilateral 2003   left  . CHOLECYSTECTOMY OPEN  1982  . COLONOSCOPY N/A 07/14/2015   Procedure: COLONOSCOPY;  Surgeon: Ladene Artist, MD;  Location: [facility];  Service: Endoscopy;  Laterality: N/A;  . ESOPHAGOGASTRODUODENOSCOPY N/A 12/27/2013   Procedure: ESOPHAGOGASTRODUODENOSCOPY (EGD);  Surgeon: Shann Medal, MD;  Location: [facility];  Service: General;  Laterality: N/A;  . ESOPHAGOGASTRODUODENOSCOPY (EGD) WITH ESOPHAGEAL DILATION    . INTRAOPERATIVE TRANSTHORACIC ECHOCARDIOGRAM  05/08/2018   Procedure: INTRAOPERATIVE TRANSTHORACIC ECHOCARDIOGRAM;  Surgeon: Burnell Blanks, MD;  Location: [place];  Service: Open Heart Surgery;;  . IR FLUORO GUIDE CV LINE RIGHT  04/25/2019  . IR REMOVAL TUN CV CATH W/O FL  06/20/2019  . IR US GUIDE VASC ACCESS RIGHT  04/25/2019  . LAPAROSCOPIC GASTRIC SLEEVE RESECTION N/A 01/20/2014   Procedure: LAPAROSCOPIC GASTRIC SLEEVE RESECTION;  Surgeon: Shann Medal, MD;  Location: [facility];  Service: General;  Laterality: N/A;  . PAROTIDECTOMY  09/07/2012   Procedure: PAROTIDECTOMY;  Surgeon: Melida Quitter, MD;  Location: [place];  Service: ENT;  Laterality: Left;  LEFT PAROTIDECTOMY  . RIGHT HEART CATH N/A 09/25/2018   Procedure: RIGHT HEART CATH;   Surgeon: Larey Dresser, MD;  Location: [place] [facility];  Service: Cardiovascular;  Laterality: N/A;  . RIGHT/LEFT HEART CATH AND CORONARY ANGIOGRAPHY N/A 03/29/2018   Procedure: RIGHT/LEFT HEART CATH AND CORONARY ANGIOGRAPHY;  Surgeon: Burnell Blanks, MD;  Location: [place] [facility];  Service: Cardiovascular;  Laterality: N/A;  . TEE WITHOUT CARDIOVERSION N/A 02/16/2018   Procedure: TRANSESOPHAGEAL ECHOCARDIOGRAM (TEE);  Surgeon: Pixie Casino, MD;  Location: [place];  Service: Cardiovascular;  Laterality: N/A;  . TEE WITHOUT CARDIOVERSION N/A 06/05/2019   Procedure: TRANSESOPHAGEAL ECHOCARDIOGRAM (TEE);  Surgeon: Sanda Klein, MD;  Location: [place];  Service: Cardiovascular;  Laterality: N/A;  . TEE WITHOUT CARDIOVERSION N/A 04/24/2019   Procedure: TRANSESOPHAGEAL ECHOCARDIOGRAM (TEE);  Surgeon: Sanda Klein, MD;  Location: [place];  Service: Cardiovascular;  Laterality: N/A;  . TOE AMPUTATION Left 07/2017   great toe; [facility]   . TONSILLECTOMY  1968  . TRANSCATHETER AORTIC VALVE REPLACEMENT, TRANSFEMORAL  05/08/2018  Transcatheter Aortic Valve Replacement   . TRANSCATHETER AORTIC VALVE REPLACEMENT, TRANSFEMORAL N/A 05/08/2018   Procedure: TRANSCATHETER AORTIC VALVE REPLACEMENT, TRANSFEMORAL;  Surgeon: Burnell Blanks, MD;  Location: [place];  Service: Open Heart Surgery;  Laterality: N/A;  . TUBAL LIGATION  yrs ago  . UPPER GI ENDOSCOPY  01/20/2014   Procedure: UPPER GI ENDOSCOPY;  Surgeon: Shann Medal, MD;  Location: [facility];  Service: General;;     (Not in a hospital admission)   Inpatient Medications:  . atropine  0.5 mg Intravenous Once    Allergies:  Allergies  Allergen Reactions  . Amlodipine Swelling    Ankle swelling  . Atorvastatin Other (See Comments)    Myalgias   . Crestor [Rosuvastatin Calcium] Other (See Comments)    Stiffness and back pain  . Ezetimibe-Simvastatin Other (See Comments)    Leg cramps  . Sitagliptin Other  (See Comments)  . Statins Other (See Comments)    Myalgias  . Celebrex [Celecoxib] Rash  . Levemir [Insulin Detemir] Rash    Social History   Socioeconomic History  . Marital status: Married    Spouse name: Not on file  . Number of children: 3  . Years of education: some college  . Highest education level: Some college, no degree  Occupational History  . Occupation: retired    Fish farm manager: [employer]  Tobacco Use  . Smoking status: Never Smoker  . Smokeless tobacco: Never Used  Vaping Use  . Vaping Use: Never used  Substance and Sexual Activity  . Alcohol use: Yes    Alcohol/week: 0.0 standard drinks    Comment:  "mixed drink q few years"  . Drug use: Never  . Sexual activity: Yes    Birth control/protection: Post-menopausal  Other Topics Concern  . Not on file  Social History Narrative   Married   Patient is right handed.   Patient rarely drinks caffeine.   Lives at home with husband in two story home. Husband washes laundry in the basement. They have 3 daughters and 7 grandchildren.    Social Determinants of Health   Financial Resource Strain:   . Difficulty of Paying Living Expenses:   Food Insecurity:   . Worried About Charity fundraiser in the Last Year:   . Arboriculturist in the Last Year:   Transportation Needs:   . Film/video editor (Medical):   Marland Kitchen Lack of Transportation (Non-Medical):   Physical Activity:   . Days of Exercise per Week:   . Minutes of Exercise per Session:   Stress:   . Feeling of Stress :   Social Connections:   . Frequency of Communication with Friends and Family:   . Frequency of Social Gatherings with Friends and Family:   . Attends Religious Services:   . Active Member of Clubs or Organizations:   . Attends Archivist Meetings:   Marland Kitchen Marital Status:   Intimate Partner Violence:   . Fear of Current or Ex-Partner:   . Emotionally Abused:   Marland Kitchen Physically Abused:   . Sexually Abused:      Family History    Problem Relation Age of Onset  . Heart failure Mother   . Hypertension Mother   . Skin cancer Mother   . Colitis Mother   . Diabetes Mother   . Kidney Stones Mother   . Stroke Mother   . Heart disease Father   . Colon polyps Father   . Diabetes Father   .  Heart failure Father   . Heart attack Sister   . Diabetes Other   . Stroke Maternal Grandfather   . Diabetes Maternal Grandfather   . Colon cancer Neg Hx      Review of Systems: All other systems reviewed and are otherwise negative except as noted above.  Physical Exam: Vitals:   02/19/20 1411 02/19/20 1445 02/19/20 1500 02/19/20 1515  BP:  122/72 128/62 122/62  Pulse: (!) 46 (!) 47 (!) 45 (!) 36  Resp: 20 18 (!) 23 17  Temp:      SpO2: 96% 100% 99% 100%  Weight:      Height:        GEN- The patient is well appearing, alert and oriented x 3 today.   HEENT: normocephalic, atraumatic; sclera clear, conjunctiva pink; hearing intact; oropharynx clear; neck supple Lungs- Clear to ausculation bilaterally, normal work of breathing.  No wheezes, rales, rhonchi Heart- Regular rate and rhythm, no murmurs, rubs or gallops GI- soft, non-tender, non-distended, bowel sounds present Extremities- no clubbing, cyanosis, or edema; DP/PT/radial pulses 2+ bilaterally MS- no significant deformity or atrophy Skin- warm and dry, no rash or lesion Psych- euthymic mood, full affect Neuro- strength and sensation are intact  Labs:   Lab Results  Component Value Date   WBC 7.8 02/19/2020   HGB 9.5 (L) 02/19/2020   HCT 32.5 (L) 02/19/2020   MCV 81.3 02/19/2020   PLT 195 02/19/2020    Recent Labs  Lab 02/19/20 1330  NA 138  K 5.0  CL 106  CO2 20*  BUN 62*  CREATININE 3.29*  CALCIUM 8.0*  GLUCOSE 473*      Radiology/Studies: CT Head Wo Contrast  Result Date: 02/19/2020 CLINICAL DATA:  Encephalopathy EXAM: CT HEAD WITHOUT CONTRAST TECHNIQUE: Contiguous axial images were obtained from the base of the skull through the vertex  without intravenous contrast. COMPARISON:  04/15/2019 FINDINGS: Brain: New small to moderate size area of hypoattenuation within the left cerebellar hemisphere. 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 There is no acute intracranial hemorrhage or significant mass effect. There is no extra-axial fluid collection. Ventricles and sulci are within normal limits in size and configuration. Vascular: There is atherosclerotic calcification at the skull base. Skull: Calvarium is unremarkable. Sinuses/Orbits: No acute finding. Other: None. IMPRESSION: New area of hypoattenuation within the left cerebellar hemisphere, which may reflect an age-indeterminate infarct. Suggest MRI for further evaluation. Electronically Signed   By: Macy Mis M.D.   On: 02/19/2020 15:36   DG Chest Portable 1 View  Result Date: 02/19/2020 CLINICAL DATA:  69 year old female with bradycardia. Recent myocardial infarction. EXAM: PORTABLE CHEST 1 VIEW COMPARISON:  Portable chest 01/16/2020 and earlier. FINDINGS: Portable AP semi upright view at 1351 hours. Stable cardiomegaly. Stable lung volumes. Pacer or resuscitation pads project over the left lower chest now. Allowing for portable technique the lungs are clear. No pneumothorax. Negative trachea. IMPRESSION: Stable cardiomegaly. No acute cardiopulmonary abnormality. Electronically Signed   By: Genevie Ann M.D.   On: 02/19/2020 14:25   LONG TERM MONITOR (3-14 DAYS)  Result Date: 02/02/2020 1. Sinus rhythm - avg HR of 73 2. 288 Supraventricular Tachycardia runs occurred, the run with the fastest interval lasting 5 beats with a max rate of 218 bpm, the longest lasting 17.2 secs with an avg rate of 115 bpm. 3. Isolated PACs were frequent (11.3%, 188416), 4. Rare PVCs (<1.0%) 5. No AF seen Glori Bickers, MD 4:01 PM   EKG: Junctional bradycardia at 34  bpm, No apparent P waves (personally reviewed)  Baseline EKG 01/20/2020 with NSR, PR interval 140, QRS 88 ms  TELEMETRY: Junctional  bradycardia on arrival that has gradually improve to sinus bradycardia in upper 40s/lower 50s since arrival (personally reviewed)  Assessment/Plan: 1.  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  She is mentating well and hemodynamically stable, no urgent indication for pacing if remains stable.  Lacitc acid initially 3.1 but repeat 1.8. K 5.0. Follow.   2. Persistent atrial fibrillation She was initially in junctional bradycardia and now appears sinus brady s/p atropine She is on eliquis for CHA2DS2VASC of at least 7   Seen by Dr. Rayann Heman and thought poor candidate for ablation  3. Chronic diastolic CHF  Echo 0/9983 with EF 60-65%, GRade 2 DD Her torsemide has been adjusted up and down lately with volume overloaded complicated by AKI.   4. 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 PYP study abnormal, myeloma panel negative She is managed on tafamidis  5. AS s/p TAVR 2019 Stable by Echo 11/2019  6. CKD IV Baseline creatinine appears around 3 recently, previously closer to 2.5-2.7  Dr. Lovena Le to see. Hold AV nodal blocking agents and follow rates. May tolerate amiodarone alone, but hold both for now. May eventually require pacing for tachy-brady.  For questions or updates, please contact [facility] Please consult [email or web address] for contact info under Cardiology/STEMI.  Jacalyn Lefevre, PA-C  02/19/2020 3:51 PM   EP Attending  Patient seen and examined. Agree with the findings as noted above. The patient initially presented with marked junctional bradycardia and hyperglycemia and reduced mentation. She is improved and her HR is now in the high 40's. She has not had chest pain or sob. Mostly fatigue and weakness. The patient denies syncope. Her exam is as noted  above. She will be admitted. Her coreg will be held. Her amiodarone dose will be reduced. She will be followed on Tele. She was noted to be a poor candidate for atrial fib ablation and she is not a good candidate for PPM insertion. Hopefully we can control her atrial fib without making her sinus node sleep.   Mikle Bosworth.D.

## 2020-02-19 NOTE — H&P (Signed)
History and Physical    [name] PFX:[id number] DOB: Nov 07, 1950 DOA: 02/19/2020  PCP: [name], DO (Confirm with patient/family/NH records and if not entered, this has to be entered at [facility] point of entry) Patient coming from: Home  I have personally briefly reviewed patient's old medical records in [place]  Chief Complaint: Feel weak  HPI: [name] is a 69 y.o. female with medical history significant of paroxysmal A. fib, CAD, hypertension, chronic diastolic CHF, aortic stenosis status post TAVR, morbid obesity, CKD stage III, sleep apnea not tolerating CPAP, IDDM, presented with symptomatic bradycardia.  Patient was hospitalized recently for new onset A. fib, was discharged on amiodarone and Coreg.  Recently, patient visited cardiologist who added hydralazine for better control of patient blood pressure.  Patient checked her blood pressure every morning routinely, and she says usually she takes Coreg in the early morning, and hydralazine before lunchtime because at one time but she took the hydralazine the morning and she felt lightheaded right after.  This morning she woke up and check her blood pressure systolic was 329J, she decided to take Coreg and hydralazine altogether which she did.  Soon afterwards she started to feel lightheaded and generalized weakness could not support herself she collapsed on the floor, she denied any loss of consciousness, she did not hit her head.  EMS came found patient glucose in the 40s and heart rate in the 20s to 30s.  Patient also reported that he has had some dry cough over the last week and with cleaning for which her cardiologist increased her Lasix but did not hold back due to elevated kidney function.  She denies any fever chills, cough is dry, no chest pain, no dysuria no diarrhea no abdominal pain no chest pains. ED Course: EKG shows junctional rhythm, potassium 5.0, creatinine 3.2 close to baseline 3.4, glucose 473, chest  x-ray no acute infiltrates.  Patient was given atropine x2, heart rate improved to 40s and repeated EKG showed sinus bradycardia.  Review of Systems: As per HPI otherwise 10 point review of systems negative.    Past Medical History:  Diagnosis Date  . Atrial fibrillation ([place])    a. maintaining sinus s/p DCCV, on Eliquis  . CAD (coronary artery disease)    a. Nonobstructive by cath 2006 - 25% LAD, 25-30% after 1st diag, distal 25% PDA, minor irreg LCx. b. Normal nuc 2011.  . CKD (chronic kidney disease), stage III   . Cystocele   . Difficult intubation 09-07-2012   big neck trouble with intubation parotid surgery"takes little med to sedate"  . Esophageal stricture   . GERD (gastroesophageal reflux disease)   . Hiatal hernia    a. 07/2013: HH with small stricture holding up barium tablet (concides with patient's sx of food sticking).  . History of kidney stones   . Hyperlipidemia   . Hypertension   . IBS (irritable bowel syndrome)   . Memory difficulty 11/26/2015  . Morbid obesity ([place])    a. hx of gastric bypass (sleeve gastrectomy 2015)  . Non-STEMI (non-ST elevated myocardial infarction) ([place]) 01/16/2020  . Osteoporosis   . 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  Marland Kitchen Pericardial effusion    a. Mod by echo 2012 at time of PNA. b. Echo 07/2012: small pericardial effusion vs fat.  . Pressure ulcer 04/16/2019   right foot  . S/P TAVR (transcatheter aortic valve replacement)   . Severe aortic stenosis   .  Sleep apnea     "have mask; don't use it" (02/14/2018)  . TIA (transient ischemic attack) 10/2014; 06/2016   "some memory issues since; daughter says my speech is sometimes different" (02/14/2018)  . Type II diabetes mellitus ([place])     Past Surgical History:  Procedure Laterality Date  . BREATH TEK H PYLORI N/A 07/29/2013   Procedure: [place];  Surgeon: Shann Medal, MD;  Location: [facility];  Service: General;  Laterality: N/A;  . CARDIAC  CATHETERIZATION  2006  . CARDIOVERSION N/A 02/16/2018   Procedure: CARDIOVERSION;  Surgeon: Pixie Casino, MD;  Location: [place];  Service: Cardiovascular;  Laterality: N/A;  . CARPAL TUNNEL RELEASE Left 2011  . CATARACT EXTRACTION W/ INTRAOCULAR LENS  IMPLANT, BILATERAL Bilateral 2003   left  . CHOLECYSTECTOMY OPEN  1982  . COLONOSCOPY N/A 07/14/2015   Procedure: COLONOSCOPY;  Surgeon: Ladene Artist, MD;  Location: [facility];  Service: Endoscopy;  Laterality: N/A;  . ESOPHAGOGASTRODUODENOSCOPY N/A 12/27/2013   Procedure: ESOPHAGOGASTRODUODENOSCOPY (EGD);  Surgeon: Shann Medal, MD;  Location: [facility];  Service: General;  Laterality: N/A;  . ESOPHAGOGASTRODUODENOSCOPY (EGD) WITH ESOPHAGEAL DILATION    . INTRAOPERATIVE TRANSTHORACIC ECHOCARDIOGRAM  05/08/2018   Procedure: INTRAOPERATIVE TRANSTHORACIC ECHOCARDIOGRAM;  Surgeon: Burnell Blanks, MD;  Location: [place];  Service: Open Heart Surgery;;  . IR FLUORO GUIDE CV LINE RIGHT  04/25/2019  . IR REMOVAL TUN CV CATH W/O FL  06/20/2019  . IR US GUIDE VASC ACCESS RIGHT  04/25/2019  . LAPAROSCOPIC GASTRIC SLEEVE RESECTION N/A 01/20/2014   Procedure: LAPAROSCOPIC GASTRIC SLEEVE RESECTION;  Surgeon: Shann Medal, MD;  Location: [facility];  Service: General;  Laterality: N/A;  . PAROTIDECTOMY  09/07/2012   Procedure: PAROTIDECTOMY;  Surgeon: Melida Quitter, MD;  Location: [place];  Service: ENT;  Laterality: Left;  LEFT PAROTIDECTOMY  . RIGHT HEART CATH N/A 09/25/2018   Procedure: RIGHT HEART CATH;  Surgeon: Larey Dresser, MD;  Location: [facility];  Service: Cardiovascular;  Laterality: N/A;  . RIGHT/LEFT HEART CATH AND CORONARY ANGIOGRAPHY N/A 03/29/2018   Procedure: RIGHT/LEFT HEART CATH AND CORONARY ANGIOGRAPHY;  Surgeon: Burnell Blanks, MD;  Location: [place] [facility];  Service: Cardiovascular;  Laterality: N/A;  . TEE WITHOUT CARDIOVERSION N/A 02/16/2018   Procedure: TRANSESOPHAGEAL ECHOCARDIOGRAM (TEE);   Surgeon: Pixie Casino, MD;  Location: [place];  Service: Cardiovascular;  Laterality: N/A;  . TEE WITHOUT CARDIOVERSION N/A 06/05/2019   Procedure: TRANSESOPHAGEAL ECHOCARDIOGRAM (TEE);  Surgeon: Sanda Klein, MD;  Location: [facility];  Service: Cardiovascular;  Laterality: N/A;  . TEE WITHOUT CARDIOVERSION N/A 04/24/2019   Procedure: TRANSESOPHAGEAL ECHOCARDIOGRAM (TEE);  Surgeon: Sanda Klein, MD;  Location: [place];  Service: Cardiovascular;  Laterality: N/A;  . TOE AMPUTATION Left 07/2017   great toe; [facility]   . TONSILLECTOMY  1968  . TRANSCATHETER AORTIC VALVE REPLACEMENT, TRANSFEMORAL  05/08/2018   Transcatheter Aortic Valve Replacement   . TRANSCATHETER AORTIC VALVE REPLACEMENT, TRANSFEMORAL N/A 05/08/2018   Procedure: TRANSCATHETER AORTIC VALVE REPLACEMENT, TRANSFEMORAL;  Surgeon: Burnell Blanks, MD;  Location: [place];  Service: Open Heart Surgery;  Laterality: N/A;  . TUBAL LIGATION  yrs ago  . UPPER GI ENDOSCOPY  01/20/2014   Procedure: UPPER GI ENDOSCOPY;  Surgeon: Shann Medal, MD;  [facility]: [facility];  Service: General;;     reports that she has never smoked. She has never used smokeless tobacco. She reports current alcohol use. She reports that she  does not use drugs.  Allergies  Allergen Reactions  . Amlodipine Swelling    Ankle swelling  . Atorvastatin Other (See Comments)    Myalgias   . Crestor [Rosuvastatin Calcium] Other (See Comments)    Stiffness and back pain  . Ezetimibe-Simvastatin Other (See Comments)    Leg cramps  . Sitagliptin Other (See Comments)  . Statins Other (See Comments)    Myalgias  . Celebrex [Celecoxib] Rash  . Levemir [Insulin Detemir] Rash    Family History  Problem Relation Age of Onset  . Heart failure Mother   . Hypertension Mother   . Skin cancer Mother   . Colitis Mother   . Diabetes Mother   . Kidney Stones Mother   . Stroke Mother   . Heart disease Father   . Colon polyps Father   . Diabetes  Father   . Heart failure Father   . Heart attack Sister   . Diabetes Other   . Stroke Maternal Grandfather   . Diabetes Maternal Grandfather   . Colon cancer Neg Hx    Prior to Admission medications   Medication Sig Start Date End Date Taking? Authorizing Provider  ACCU-CHEK AVIVA PLUS test strip USE TO CHECK BLOOD SUGAR UP TO TWICE DAILY OR AS INSTRUCTED 09/04/17   Chipper Herb, MD  acetaminophen (TYLENOL) 500 MG tablet Take 1,000 mg by mouth as needed for mild pain.     [provider]  amiodarone (PACERONE) 400 MG tablet Take 1 tablet (400 mg total) by mouth 2 (two) times daily. 01/17/20   Furth, Cadence H, PA-C  B-D INS SYR ULTRAFINE 1CC/31G 31G X 5/16" 1 ML MISC USE TO INJECT INSULIN TWICE A DAY AS INSTRUCTED Patient taking differently: 1 Stick by Other route 3 (three) times daily before meals.  11/16/15   Chipper Herb, MD  Calcium Carbonate (CALCIUM 600 PO) Take 1 tablet by mouth daily.    [provider]  carvedilol (COREG) 25 MG tablet Take 25 mg by mouth 2 (two) times daily with a meal.    [provider]  Cholecalciferol (VITAMIN D3 PO) 2 gummies by mouth daily    [provider]  Cyanocobalamin (VITAMIN B-12 PO) Take by mouth. 2 gummies by mouth daily    [provider]  ELIQUIS 5 MG TABS tablet TAKE 1 TABLET BY MOUTH TWICE A DAY 02/03/20   Minus Breeding, MD  fenofibrate (TRICOR) 145 MG tablet TAKE 1 TABLET BY MOUTH EVERY DAY Patient taking differently: Take 145 mg by mouth daily.  12/16/19   Larey Dresser, MD  hydrALAZINE (APRESOLINE) 10 MG tablet Take 10 mg by mouth 2 (two) times daily.     [provider]  insulin NPH Human (NOVOLIN N) 100 UNIT/ML injection Inject 25-50 Units into the skin See admin instructions. 50 units in the am 60 units at lunch and 20 units for supper - on a sliding scale    [provider]  Insulin Pen Needle 32G X 4 MM MISC Use to inject insulin with insulin pen 05/17/16   Chipper Herb,  MD  LIVALO 4 MG TABS Take 4 mg by mouth every evening.    [provider]  meclizine (ANTIVERT) 12.5 MG tablet Take 1 tablet (12.5 mg total) by mouth 3 (three) times daily as needed for dizziness. 06/14/19   Larey Dresser, MD  Multiple Vitamins-Minerals (MULTIVITAMIN GUMMIES ADULT PO) Taking two gummies by mouth daily    [provider]  nutrition supplement, JUVEN, (JUVEN) PACK Take 1 packet by mouth 2 (two) times daily between meals. 04/26/19   Swayze, Ava, DO  ondansetron (ZOFRAN ODT) 4 MG disintegrating tablet Take 1 tablet (4 mg total) by mouth every 8 (eight) hours as needed for nausea. Patient taking differently: Take 4 mg by mouth as needed for nausea.  01/03/17   Larene Pickett, PA-C  PRALUENT 75 MG/ML SOAJ INJECT 75 MG INTO THE SKIN EVERY 14 (FOURTEEN) DAYS. 11/18/19   Larey Dresser, MD  RELION INSULIN SYRINGE 31G X 15/64" 1 ML MISC Inject 1 Syringe into the skin 3 (three) times daily. 01/04/20   [provider]  Tafamidis (VYNDAMAX) 61 MG CAPS Take 61 mg by mouth daily. 07/29/19   Larey Dresser, MD  torsemide (DEMADEX) 20 MG tablet Take 1 tablet (20 mg total) by mouth daily. 02/13/20   Larey Dresser, MD  VASCEPA 1 g capsule TAKE 2 CAPSULES (2 G TOTAL) BY MOUTH 2 (TWO) TIMES A DAY. 01/16/20   Larey Dresser, MD  Olmesartan-Amlodipine-HCTZ [facility]) 40-5-12.5 MG TABS Take 1 tablet by mouth daily.    04/12/18  [provider]    Physical Exam: Vitals:   02/19/20 1411 02/19/20 1445 02/19/20 1500 02/19/20 1515  BP:  122/72 128/62 122/62  Pulse: (!) 46 (!) 47 (!) 45 (!) 36  Resp: 20 18 (!) 23 17  Temp:      SpO2: 96% 100% 99% 100%  Weight:      Height:        Constitutional: NAD, calm, comfortable Vitals:   02/19/20 1411 02/19/20 1445 02/19/20 1500 02/19/20 1515  BP:  122/72 128/62 122/62  Pulse: (!) 46 (!) 47 (!) 45 (!) 36  Resp: 20 18 (!) 23 17  Temp:      SpO2: 96% 100% 99% 100%  Weight:      Height:       Eyes: PERRL, lids and  conjunctivae normal ENMT: Mucous membranes are moist. Posterior pharynx clear of any exudate or lesions.Normal dentition.  Neck: normal, supple, no masses, no thyromegaly Respiratory: clear to auscultation bilaterally, no wheezing, no crackles. Normal respiratory effort. No accessory muscle use.  Cardiovascular: Regular rate and rhythm, no murmurs / rubs / gallops. No extremity edema. 2+ pedal pulses. No carotid bruits.  Abdomen: no tenderness, no masses palpated. No hepatosplenomegaly. Bowel sounds positive.  Musculoskeletal: no clubbing / cyanosis. No joint deformity upper and lower extremities. Good ROM, no contractures. Normal muscle tone.  Skin: no rashes, lesions, ulcers. No induration Neurologic: CN 2-12 grossly intact. Sensation intact, DTR normal. Strength 5/5 in all 4.  Psychiatric: Normal judgment and insight. Alert and oriented x 3. Normal mood.     Labs on Admission: I have personally reviewed following labs and imaging studies  CBC: Recent Labs  Lab 02/19/20 1330  WBC 7.8  NEUTROABS 5.4  HGB 9.5*  HCT 32.5*  MCV 81.3  PLT 063   Basic Metabolic Panel: Recent Labs  Lab 02/19/20 1330  NA 138  K 5.0  CL 106  CO2 20*  GLUCOSE 473*  BUN 62*  CREATININE 3.29*  CALCIUM 8.0*   GFR: Estimated Creatinine Clearance: 19.1 mL/min (A) (by C-G formula based on SCr of 3.29 mg/dL (H)). Liver Function Tests: No results for input(s): AST, ALT, ALKPHOS, BILITOT, PROT, ALBUMIN in the last 168 hours. No results for input(s): LIPASE, AMYLASE in the last 168 hours. No results for input(s): AMMONIA in the last 168 hours. Coagulation Profile:  Recent Labs  Lab 02/19/20 1330  INR 1.8*   Cardiac Enzymes: No results for input(s): CKTOTAL, CKMB, CKMBINDEX, TROPONINI in the last 168 hours. BNP (last 3 results) No results for input(s): PROBNP in the last 8760 hours. HbA1C: No results for input(s): HGBA1C in the last 72 hours. CBG: Recent Labs  Lab 02/19/20 1623  GLUCAP 385*     Lipid Profile: No results for input(s): CHOL, HDL, LDLCALC, TRIG, CHOLHDL, LDLDIRECT in the last 72 hours. Thyroid Function Tests: No results for input(s): TSH, T4TOTAL, FREET4, T3FREE, THYROIDAB in the last 72 hours. Anemia Panel: No results for input(s): VITAMINB12, FOLATE, FERRITIN, TIBC, IRON, RETICCTPCT in the last 72 hours. Urine analysis:    Component Value Date/Time   COLORURINE STRAW (A) 04/23/2019 1737   APPEARANCEUR CLEAR 04/23/2019 1737   APPEARANCEUR Clear 06/19/2018 1637   LABSPEC 1.011 04/23/2019 1737   PHURINE 5.0 04/23/2019 1737   GLUCOSEU 150 (A) 04/23/2019 1737   HGBUR NEGATIVE 04/23/2019 1737   BILIRUBINUR NEGATIVE 04/23/2019 1737   BILIRUBINUR Negative 06/19/2018 1637   KETONESUR NEGATIVE 04/23/2019 1737   PROTEINUR NEGATIVE 04/23/2019 1737   UROBILINOGEN 1.0 11/07/2014 2242   NITRITE NEGATIVE 04/23/2019 1737   LEUKOCYTESUR NEGATIVE 04/23/2019 1737    Radiological Exams on Admission: CT Head Wo Contrast  Result Date: 02/19/2020 CLINICAL DATA:  Encephalopathy EXAM: CT HEAD WITHOUT CONTRAST TECHNIQUE: Contiguous axial images were obtained from the base of the skull through the vertex without intravenous contrast. COMPARISON:  04/15/2019 FINDINGS: Brain: New small to moderate size area of hypoattenuation within the left cerebellar hemisphere. 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 There is no acute intracranial hemorrhage or significant mass effect. There is no extra-axial fluid collection. Ventricles and sulci are within normal limits in size and configuration. Vascular: There is atherosclerotic calcification at the skull base. Skull: Calvarium is unremarkable. Sinuses/Orbits: No acute finding. Other: None. IMPRESSION: New area of hypoattenuation within the left cerebellar hemisphere, which may reflect an age-indeterminate infarct. Suggest MRI for further evaluation. Electronically Signed   By: Macy Mis M.D.   On: 02/19/2020 15:36   DG Chest  Portable 1 View  Result Date: 02/19/2020 CLINICAL DATA:  69 year old female with bradycardia. Recent myocardial infarction. EXAM: PORTABLE CHEST 1 VIEW COMPARISON:  Portable chest 01/16/2020 and earlier. FINDINGS: Portable AP semi upright view at 1351 hours. Stable cardiomegaly. Stable lung volumes. Pacer or resuscitation pads project over the left lower chest now. Allowing for portable technique the lungs are clear. No pneumothorax. Negative trachea. IMPRESSION: Stable cardiomegaly. No acute cardiopulmonary abnormality. Electronically Signed   By: Genevie Ann M.D.   On: 02/19/2020 14:25    EKG: Independently reviewed.  Junctional rhythm  Assessment/Plan Active Problems:   Bradycardia  (please populate well all problems here in Problem List. (For example, if patient is on BP meds at home and you resume or decide to hold them, it is a problem that needs to be her. Same for CAD, COPD, HLD and so on)  Symptomatic bradycardia -Agreed to hold beta-blocker, and amiodarone.  Change hydralazine to as needed for blood pressure more than 150. -TSH and cortisol, rest of the work-up as per cardiologist -Admit to progressive care unit for close monitoring  Lactic acidosis -Probably related to symptomatic bradycardia and one-time hypotension since patient also had presyncope symptoms. -Hold antibiotics for now  Paroxysmal A. Fib -Patient has had outpatient monitoring showing fairly controlled A. Fib -Probably will need prolonged monitoring given this episode, to rule out any sick sinus  syndrome. -Her slow- down cardiac rhythm is junctional but not sinus seems indicating a poor functional S-A node function? and she has been on the same dose of Coreg for more than 1 month without any issue.  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 -Eliquis  Chronic diastolic CHF -Euvolemic, Lasix as per cardiology  History of OSA -Not tolerating  CPAP in the past, check VBG to rule out any significant CO2 retention  Uncontrolled diabetes -Change to sliding scale given significant hypoglycemia episode this morning.  CKD stage III -Euvolemic, creatinine level stable, outpatient nephrology follow-up  Morbid obesity -Status post gastric bypass  DVT prophylaxis: Eliquis Code Status: Full code Family Communication: Daughter at bedside Disposition Plan: Expect less than 24 hours of hospital stay for monitoring heart rate of beta-blocker and amiodarone Consults called: Cardiology Admission status: Daughter at bedside   Lequita Halt MD [facility] Pager [phone]    02/19/2020, 5:28 PM

## 2020-02-20 DIAGNOSIS — E1169 Type 2 diabetes mellitus with other specified complication: Secondary | ICD-10-CM | POA: Diagnosis present

## 2020-02-20 DIAGNOSIS — Z794 Long term (current) use of insulin: Secondary | ICD-10-CM | POA: Diagnosis not present

## 2020-02-20 DIAGNOSIS — E1122 Type 2 diabetes mellitus with diabetic chronic kidney disease: Secondary | ICD-10-CM | POA: Diagnosis present

## 2020-02-20 DIAGNOSIS — I5033 Acute on chronic diastolic (congestive) heart failure: Secondary | ICD-10-CM | POA: Diagnosis present

## 2020-02-20 DIAGNOSIS — I4891 Unspecified atrial fibrillation: Secondary | ICD-10-CM | POA: Diagnosis not present

## 2020-02-20 DIAGNOSIS — R739 Hyperglycemia, unspecified: Secondary | ICD-10-CM | POA: Diagnosis not present

## 2020-02-20 DIAGNOSIS — I350 Nonrheumatic aortic (valve) stenosis: Secondary | ICD-10-CM | POA: Diagnosis not present

## 2020-02-20 DIAGNOSIS — N390 Urinary tract infection, site not specified: Secondary | ICD-10-CM | POA: Diagnosis present

## 2020-02-20 DIAGNOSIS — I2510 Atherosclerotic heart disease of native coronary artery without angina pectoris: Secondary | ICD-10-CM | POA: Diagnosis present

## 2020-02-20 DIAGNOSIS — N179 Acute kidney failure, unspecified: Secondary | ICD-10-CM | POA: Diagnosis present

## 2020-02-20 DIAGNOSIS — I4819 Other persistent atrial fibrillation: Secondary | ICD-10-CM

## 2020-02-20 DIAGNOSIS — J189 Pneumonia, unspecified organism: Secondary | ICD-10-CM | POA: Diagnosis present

## 2020-02-20 DIAGNOSIS — I480 Paroxysmal atrial fibrillation: Secondary | ICD-10-CM | POA: Diagnosis not present

## 2020-02-20 DIAGNOSIS — I313 Pericardial effusion (noninflammatory): Secondary | ICD-10-CM | POA: Diagnosis not present

## 2020-02-20 DIAGNOSIS — E872 Acidosis: Secondary | ICD-10-CM | POA: Diagnosis present

## 2020-02-20 DIAGNOSIS — I252 Old myocardial infarction: Secondary | ICD-10-CM | POA: Diagnosis not present

## 2020-02-20 DIAGNOSIS — E876 Hypokalemia: Secondary | ICD-10-CM | POA: Diagnosis present

## 2020-02-20 DIAGNOSIS — N184 Chronic kidney disease, stage 4 (severe): Secondary | ICD-10-CM | POA: Diagnosis present

## 2020-02-20 DIAGNOSIS — N183 Chronic kidney disease, stage 3 unspecified: Secondary | ICD-10-CM | POA: Diagnosis not present

## 2020-02-20 DIAGNOSIS — J9601 Acute respiratory failure with hypoxia: Secondary | ICD-10-CM | POA: Diagnosis present

## 2020-02-20 DIAGNOSIS — E1121 Type 2 diabetes mellitus with diabetic nephropathy: Secondary | ICD-10-CM | POA: Diagnosis not present

## 2020-02-20 DIAGNOSIS — Z20822 Contact with and (suspected) exposure to covid-19: Secondary | ICD-10-CM | POA: Diagnosis present

## 2020-02-20 DIAGNOSIS — Z6832 Body mass index (BMI) 32.0-32.9, adult: Secondary | ICD-10-CM | POA: Diagnosis not present

## 2020-02-20 DIAGNOSIS — I10 Essential (primary) hypertension: Secondary | ICD-10-CM | POA: Diagnosis not present

## 2020-02-20 DIAGNOSIS — E1165 Type 2 diabetes mellitus with hyperglycemia: Secondary | ICD-10-CM | POA: Diagnosis present

## 2020-02-20 DIAGNOSIS — E785 Hyperlipidemia, unspecified: Secondary | ICD-10-CM | POA: Diagnosis present

## 2020-02-20 DIAGNOSIS — I5021 Acute systolic (congestive) heart failure: Secondary | ICD-10-CM | POA: Diagnosis not present

## 2020-02-20 DIAGNOSIS — I214 Non-ST elevation (NSTEMI) myocardial infarction: Secondary | ICD-10-CM | POA: Diagnosis not present

## 2020-02-20 DIAGNOSIS — R001 Bradycardia, unspecified: Secondary | ICD-10-CM | POA: Diagnosis not present

## 2020-02-20 DIAGNOSIS — Z953 Presence of xenogenic heart valve: Secondary | ICD-10-CM | POA: Diagnosis not present

## 2020-02-20 DIAGNOSIS — I340 Nonrheumatic mitral (valve) insufficiency: Secondary | ICD-10-CM | POA: Diagnosis not present

## 2020-02-20 DIAGNOSIS — E669 Obesity, unspecified: Secondary | ICD-10-CM | POA: Diagnosis present

## 2020-02-20 DIAGNOSIS — I440 Atrioventricular block, first degree: Secondary | ICD-10-CM | POA: Diagnosis present

## 2020-02-20 DIAGNOSIS — I130 Hypertensive heart and chronic kidney disease with heart failure and stage 1 through stage 4 chronic kidney disease, or unspecified chronic kidney disease: Secondary | ICD-10-CM | POA: Diagnosis present

## 2020-02-20 DIAGNOSIS — N189 Chronic kidney disease, unspecified: Secondary | ICD-10-CM | POA: Diagnosis not present

## 2020-02-20 DIAGNOSIS — I495 Sick sinus syndrome: Secondary | ICD-10-CM | POA: Diagnosis present

## 2020-02-20 DIAGNOSIS — I43 Cardiomyopathy in diseases classified elsewhere: Secondary | ICD-10-CM | POA: Diagnosis present

## 2020-02-20 DIAGNOSIS — E854 Organ-limited amyloidosis: Secondary | ICD-10-CM | POA: Diagnosis present

## 2020-02-20 LAB — CBG MONITORING, ED
Glucose-Capillary: 242 mg/dL — ABNORMAL HIGH (ref 70–99)
Glucose-Capillary: 206 mg/dL — ABNORMAL HIGH (ref 70–99)
Glucose-Capillary: 322 mg/dL — ABNORMAL HIGH (ref 70–99)

## 2020-02-20 LAB — BASIC METABOLIC PANEL
Anion gap: 11 (ref 5–15)
BUN: 57 mg/dL — ABNORMAL HIGH (ref 8–23)
CO2: 24 mmol/L (ref 22–32)
Calcium: 8.6 mg/dL — ABNORMAL LOW (ref 8.9–10.3)
Chloride: 107 mmol/L (ref 98–111)
Creatinine, Ser: 3.28 mg/dL — ABNORMAL HIGH (ref 0.44–1.00)
GFR calc Af Amer: 16 mL/min — ABNORMAL LOW (ref >60)
GFR calc non Af Amer: 14 mL/min — ABNORMAL LOW (ref >60)
Glucose, Bld: 229 mg/dL — ABNORMAL HIGH (ref 70–99)
Potassium: 4.4 mmol/L (ref 3.5–5.1)
Sodium: 142 mmol/L (ref 135–145)

## 2020-02-20 LAB — MAGNESIUM: Magnesium: 2.2 mg/dL (ref 1.7–2.4)

## 2020-02-20 LAB — GLUCOSE, CAPILLARY
Glucose-Capillary: 374 mg/dL — ABNORMAL HIGH (ref 70–99)
Glucose-Capillary: 271 mg/dL — ABNORMAL HIGH (ref 70–99)

## 2020-02-20 MED ORDER — IPRATROPIUM-ALBUTEROL 0.5-2.5 (3) MG/3ML IN SOLN
3.0000 mL | Freq: Four times a day (QID) | RESPIRATORY_TRACT | Status: DC
  Administered 2020-02-20: 3 mL via RESPIRATORY_TRACT
  Filled 2020-02-20: qty 3

## 2020-02-20 MED ORDER — IPRATROPIUM-ALBUTEROL 0.5-2.5 (3) MG/3ML IN SOLN: 3.0000 mL | Freq: Four times a day (QID) | RESPIRATORY_TRACT | Status: DC | PRN

## 2020-02-20 MED ORDER — AMIODARONE HCL 200 MG PO TABS
200.0000 mg | ORAL_TABLET | Freq: Every day | ORAL | Status: DC
  Administered 2020-02-20 – 2020-02-23 (×4): 200 mg via ORAL
  Filled 2020-02-20 (×4): qty 1

## 2020-02-20 MED ORDER — LEVALBUTEROL HCL 1.25 MG/0.5ML IN NEBU
1.2500 mg | INHALATION_SOLUTION | Freq: Three times a day (TID) | RESPIRATORY_TRACT | Status: DC
  Administered 2020-02-20 – 2020-02-22 (×7): 1.25 mg via RESPIRATORY_TRACT
  Filled 2020-02-20 (×7): qty 0.5

## 2020-02-20 MED ORDER — HYDRALAZINE HCL 10 MG PO TABS
10.0000 mg | ORAL_TABLET | Freq: Two times a day (BID) | ORAL | Status: DC
  Administered 2020-02-20: 10 mg via ORAL
  Filled 2020-02-20 (×3): qty 1

## 2020-02-20 MED ORDER — IPRATROPIUM BROMIDE 0.02 % IN SOLN
0.5000 mg | Freq: Three times a day (TID) | RESPIRATORY_TRACT | Status: DC
  Administered 2020-02-20 – 2020-02-22 (×7): 0.5 mg via RESPIRATORY_TRACT
  Filled 2020-02-20 (×7): qty 2.5

## 2020-02-20 MED ORDER — LEVALBUTEROL HCL 0.63 MG/3ML IN NEBU
0.6300 mg | INHALATION_SOLUTION | Freq: Four times a day (QID) | RESPIRATORY_TRACT | Status: DC | PRN
  Filled 2020-02-20: qty 3

## 2020-02-20 NOTE — ED Notes (Signed)
Deneise Lever RN given report

## 2020-02-20 NOTE — Plan of Care (Signed)
  Problem: Education: Goal: Knowledge of General Education information will improve Description Including pain rating scale, medication(s)/side effects and non-pharmacologic comfort measures Outcome: Progressing   Problem: Health Behavior/Discharge Planning: Goal: Ability to manage health-related needs will improve Outcome: Progressing   

## 2020-02-20 NOTE — Consult Note (Addendum)
Advanced Heart Failure Team Consult Note   Primary Physician: [name], DO PCP-Cardiologist:  Minus Breeding, MD  HF MD: Dr Aundra Dubin   Reason for Consultation: Heart Failure   HPI:    [name] is seen today for evaluation of heart failure at the request of Dr Darrick Meigs.   Ms Mancino is a 69 year old with a history of AS s/p TAVR, chronic diastolic CHF, PAF, CKD III, HLD, HTN, OSA,TTR amyloidosis, PAD,and morbid obesity.  Presented [facility] on 12/09/19 with chest pain and increased SOB. Initially in A Fib RVR but spontaneously converted to NSR shortly after arriving to the ED. HS Trop elevated but this was felt to be demand ischemia with recurrent A fib. Diuresed with IV lasix and converted back torsemide on higher dose 20 mg twice one day then 20 mg once daily the next.  Yesterday she was at home and developed weakness after taking coreg and hydralazine. Says she has been weak for the last week.    Presented to [facility] ED via EMS with heart rate 20-30s and glucose in 40s. Given atropine x2. BB and amio held. EP consulted. Heart rate had improved to the 50s Sinus Loletha Grayer. Nodal agents held. She did not require pacer. CXR ok.  HS trop 21, lactic acid 31, SARs 2 negative.   Echo 11/2019 60-65%.   Review of Systems: [y] = yes, [ ]  = no   . General: Weight gain [ ] ; Weight loss [ ] ; Anorexia [ ] ; Fatigue [Y ]; Fever [ ] ; Chills [ ] ; Weakness [Y ]  . Cardiac: Chest pain/pressure [ ] ; Resting SOB [ ] ; Exertional SOB [ ] ; Orthopnea [ ] ; Pedal Edema [ ] ; Palpitations [ ] ; Syncope [ ] ; Presyncope [ ] ; Paroxysmal nocturnal dyspnea[ ]   . Pulmonary: Cough [ ] ; Wheezing[ ] ; Hemoptysis[ ] ; Sputum [ ] ; Snoring [ ]   . GI: Vomiting[ ] ; Dysphagia[ ] ; Melena[ ] ; Hematochezia [ ] ; Heartburn[ ] ; Abdominal pain [ ] ; Constipation [ ] ; Diarrhea [ ] ; BRBPR [ ]   . GU: Hematuria[ ] ; Dysuria [ ] ; Nocturia[ ]   . Vascular: Pain in legs with walking [ ] ; Pain in feet with lying flat [ ] ; Non-healing sores [ ] ;  Stroke [ ] ; TIA [ ] ; Slurred speech [ ] ;  . Neuro: Headaches[ ] ; Vertigo[ ] ; Seizures[ ] ; Paresthesias[ ] ;Blurred vision [ ] ; Diplopia [ ] ; Vision changes [ ]   . Ortho/Skin: Arthritis [ ] ; Joint pain [ Y]; Muscle pain [ Y]; Joint swelling [ ] ; Back Pain [ Y- . +]; Rash [ ]   . Psych: Depression[Y ]; Anxiety[ ]   . Heme: Bleeding problems [ ] ; Clotting disorders [ ] ; Anemia [ ]   . Endocrine: Diabetes [Y ]; Thyroid dysfunction[ ]   Home Medications Prior to Admission medications   Medication Sig Start Date End Date Taking? Authorizing Provider  ACCU-CHEK AVIVA PLUS test strip USE TO CHECK BLOOD SUGAR UP TO TWICE DAILY OR AS INSTRUCTED 09/04/17  Yes Chipper Herb, MD  amiodarone (PACERONE) 200 MG tablet Take 400 mg by mouth 2 (two) times daily.   Yes [provider]  amiodarone (PACERONE) 200 MG tablet Take 400 mg by mouth 2 (two) times daily.   Yes [provider]  B-D INS SYR ULTRAFINE 1CC/31G 31G X 5/16" 1 ML MISC USE TO INJECT INSULIN TWICE A DAY AS INSTRUCTED Patient taking differently: 1 Stick by Other route 3 (three) times daily before meals.  11/16/15  Yes Chipper Herb, MD  Calcium Carbonate (CALCIUM 600 PO) Take 1 tablet by mouth daily.   Yes [provider]  carvedilol (COREG) 25 MG tablet Take 25 mg by mouth 2 (two) times daily with a meal.   Yes [provider]  Cholecalciferol (VITAMIN D3 PO) Take 1 tablet by mouth in the morning and at bedtime.    Yes [provider]  Cyanocobalamin (VITAMIN B-12 PO) Take 1 tablet by mouth daily. 2 gummies by mouth daily    Yes [provider]  ELIQUIS 5 MG TABS tablet TAKE 1 TABLET BY MOUTH TWICE A DAY Patient taking differently: Take 5 mg by mouth 2 (two) times daily.  02/03/20  Yes Minus Breeding, MD  fenofibrate (TRICOR) 145 MG tablet TAKE 1 TABLET BY MOUTH EVERY DAY Patient taking differently: Take 145 mg by mouth daily.  12/16/19  Yes Larey Dresser, MD  hydrALAZINE (APRESOLINE) 10 MG  tablet Take 10 mg by mouth 2 (two) times daily.    Yes [provider]  insulin NPH Human (NOVOLIN N) 100 UNIT/ML injection Inject 20-60 Units into the skin 2 (two) times daily before a meal. 60 units in the am 60 units at lunch and 20 units for supper - on a sliding scale   Yes [provider]  Insulin Pen Needle 32G X 4 MM MISC Use to inject insulin with insulin pen 05/17/16  Yes Chipper Herb, MD  LIVALO 4 MG TABS Take 4 mg by mouth every evening.   Yes [provider]  loratadine (CLARITIN) 10 MG tablet Take 10 mg by mouth daily.   Yes [provider]  meclizine (ANTIVERT) 12.5 MG tablet Take 1 tablet (12.5 mg total) by mouth 3 (three) times daily as needed for dizziness. 06/14/19  Yes Larey Dresser, MD  Multiple Vitamins-Minerals (MULTIVITAMIN GUMMIES ADULT PO) Taking two gummies by mouth daily   Yes [provider]  NOVOLIN R RELION 100 UNIT/ML injection Inject 20-30 Units into the skin 3 (three) times daily before meals. Per sliding scale 02/07/20  Yes [provider]  nutrition supplement, JUVEN, (JUVEN) PACK Take 1 packet by mouth 2 (two) times daily between meals. 04/26/19  Yes Swayze, Ava, DO  ondansetron (ZOFRAN ODT) 4 MG disintegrating tablet Take 1 tablet (4 mg total) by mouth every 8 (eight) hours as needed for nausea. 01/03/17  Yes Larene Pickett, PA-C  PRALUENT 75 MG/ML SOAJ INJECT 75 MG INTO THE SKIN EVERY 14 (FOURTEEN) DAYS. 11/18/19  Yes Larey Dresser, MD  RELION INSULIN SYRINGE 31G X 15/64" 1 ML MISC Inject 1 Syringe into the skin 3 (three) times daily. 01/04/20  Yes [provider]  torsemide (DEMADEX) 20 MG tablet Take 1 tablet (20 mg total) by mouth daily. 02/13/20  Yes Larey Dresser, MD  VASCEPA 1 g capsule TAKE 2 CAPSULES (2 G TOTAL) BY MOUTH 2 (TWO) TIMES A DAY. Patient taking differently: Take 2 g by mouth 2 (two) times daily.  01/16/20  Yes Larey Dresser, MD  amiodarone (PACERONE) 400 MG tablet Take 1  tablet (400 mg total) by mouth 2 (two) times daily. Patient not taking: Reported on 02/19/2020 01/17/20   Furth, Cadence H, PA-C  Tafamidis (VYNDAMAX) 61 MG CAPS Take 61 mg by mouth daily. Patient not taking: Reported on 02/19/2020 07/29/19   Larey Dresser, MD  Olmesartan-Amlodipine-HCTZ [facility]) 40-5-12.5 MG TABS Take 1 tablet by mouth daily.    04/12/18  [provider]    Past Medical History:  Past Medical History:  Diagnosis Date  . Atrial fibrillation ([place])    a. maintaining sinus s/p DCCV, on Eliquis  . CAD (coronary artery disease)    a. Nonobstructive by cath 2006 - 25% LAD, 25-30% after 1st diag, distal 25% PDA, minor irreg LCx. b. Normal nuc 2011.  . CKD (chronic kidney disease), stage III   . Cystocele   . Difficult intubation 09-07-2012   big neck trouble with intubation parotid surgery"takes little med to sedate"  . Esophageal stricture   . GERD (gastroesophageal reflux disease)   . Hiatal hernia    a. 07/2013: HH with small stricture holding up barium tablet (concides with patient's sx of food sticking).  . History of kidney stones   . Hyperlipidemia   . Hypertension   . IBS (irritable bowel syndrome)   . Memory difficulty 11/26/2015  . Morbid obesity ([place])    a. hx of gastric bypass (sleeve gastrectomy 2015)  . Non-STEMI (non-ST elevated myocardial infarction) ([place]) 01/16/2020  . Osteoporosis   . 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  Marland Kitchen Pericardial effusion    a. Mod by echo 2012 at time of PNA. b. Echo 07/2012: small pericardial effusion vs fat.  . Pressure ulcer 04/16/2019   right foot  . S/P TAVR (transcatheter aortic valve replacement)   . Severe aortic stenosis   . Sleep apnea     "have mask; don't use it" (02/14/2018)  . TIA (transient ischemic attack) 10/2014; 06/2016   "some memory issues since; daughter says my speech is sometimes different" (02/14/2018)  . Type II diabetes mellitus ([place])     Past Surgical History: Past  Surgical History:  Procedure Laterality Date  . BREATH TEK H PYLORI N/A 07/29/2013   Procedure: [place];  Surgeon: Shann Medal, MD;  Location: [facility];  Service: General;  Laterality: N/A;  . CARDIAC CATHETERIZATION  2006  . CARDIOVERSION N/A 02/16/2018   Procedure: CARDIOVERSION;  Surgeon: Pixie Casino, MD;  Location: [place];  Service: Cardiovascular;  Laterality: N/A;  . CARPAL TUNNEL RELEASE Left 2011  . CATARACT EXTRACTION W/ INTRAOCULAR LENS  IMPLANT, BILATERAL Bilateral 2003   left  . CHOLECYSTECTOMY OPEN  1982  . COLONOSCOPY N/A 07/14/2015   Procedure: COLONOSCOPY;  Surgeon: Ladene Artist, MD;  Location: [facility];  Service: Endoscopy;  Laterality: N/A;  . ESOPHAGOGASTRODUODENOSCOPY N/A 12/27/2013   Procedure: ESOPHAGOGASTRODUODENOSCOPY (EGD);  Surgeon: Shann Medal, MD;  Location: [facility];  Service: General;  Laterality: N/A;  . ESOPHAGOGASTRODUODENOSCOPY (EGD) WITH ESOPHAGEAL DILATION    . INTRAOPERATIVE TRANSTHORACIC ECHOCARDIOGRAM  05/08/2018   Procedure: INTRAOPERATIVE TRANSTHORACIC ECHOCARDIOGRAM;  Surgeon: Burnell Blanks, MD;  Location: [place];  Service: Open Heart Surgery;;  . IR FLUORO GUIDE CV LINE RIGHT  04/25/2019  . IR REMOVAL TUN CV CATH W/O FL  06/20/2019  . IR US GUIDE VASC ACCESS RIGHT  04/25/2019  . LAPAROSCOPIC GASTRIC SLEEVE RESECTION N/A 01/20/2014   Procedure: LAPAROSCOPIC GASTRIC SLEEVE RESECTION;  Surgeon: Shann Medal, MD;  Location: [facility];  Service: General;  Laterality: N/A;  . PAROTIDECTOMY  09/07/2012   Procedure: PAROTIDECTOMY;  Surgeon: Melida Quitter, MD;  Location: [place];  Service: ENT;  Laterality: Left;  LEFT PAROTIDECTOMY  . RIGHT HEART CATH N/A 09/25/2018   Procedure: RIGHT HEART CATH;  Surgeon: Larey Dresser, MD;  Location: [facility];  Service: Cardiovascular;  Laterality: N/A;  . RIGHT/LEFT HEART CATH AND CORONARY ANGIOGRAPHY  N/A 03/29/2018   Procedure: RIGHT/LEFT HEART CATH AND CORONARY  ANGIOGRAPHY;  Surgeon: Burnell Blanks, MD;  Location: [facility];  Service: Cardiovascular;  Laterality: N/A;  . TEE WITHOUT CARDIOVERSION N/A 02/16/2018   Procedure: TRANSESOPHAGEAL ECHOCARDIOGRAM (TEE);  Surgeon: Pixie Casino, MD;  Location: [place];  Service: Cardiovascular;  Laterality: N/A;  . TEE WITHOUT CARDIOVERSION N/A 06/05/2019   Procedure: TRANSESOPHAGEAL ECHOCARDIOGRAM (TEE);  Surgeon: Sanda Klein, MD;  Location: [place];  Service: Cardiovascular;  Laterality: N/A;  . TEE WITHOUT CARDIOVERSION N/A 04/24/2019   Procedure: TRANSESOPHAGEAL ECHOCARDIOGRAM (TEE);  Surgeon: Sanda Klein, MD;  Location: [place];  Service: Cardiovascular;  Laterality: N/A;  . TOE AMPUTATION Left 07/2017   great toe; [facility]   . TONSILLECTOMY  1968  . TRANSCATHETER AORTIC VALVE REPLACEMENT, TRANSFEMORAL  05/08/2018   Transcatheter Aortic Valve Replacement   . TRANSCATHETER AORTIC VALVE REPLACEMENT, TRANSFEMORAL N/A 05/08/2018   Procedure: TRANSCATHETER AORTIC VALVE REPLACEMENT, TRANSFEMORAL;  Surgeon: Burnell Blanks, MD;  Location: [place];  Service: Open Heart Surgery;  Laterality: N/A;  . TUBAL LIGATION  yrs ago  . UPPER GI ENDOSCOPY  01/20/2014   Procedure: UPPER GI ENDOSCOPY;  Surgeon: Shann Medal, MD;  [facility];  Service: General;;    Family History: Family History  Problem Relation Age of Onset  . Heart failure Mother   . Hypertension Mother   . Skin cancer Mother   . Colitis Mother   . Diabetes Mother   . Kidney Stones Mother   . Stroke Mother   . Heart disease Father   . Colon polyps Father   . Diabetes Father   . Heart failure Father   . Heart attack Sister   . Diabetes Other   . Stroke Maternal Grandfather   . Diabetes Maternal Grandfather   . Colon cancer Neg Hx     Social History: Social History   Socioeconomic History  . Marital status: Married    Spouse name: Not on file  . Number of children: 3  . Years of  education: some college  . Highest education level: Some college, no degree  Occupational History  . Occupation: retired    Fish farm manager: [employer]  Tobacco Use  . Smoking status: Never Smoker  . Smokeless tobacco: Never Used  Vaping Use  . Vaping Use: Never used  Substance and Sexual Activity  . Alcohol use: Yes    Alcohol/week: 0.0 standard drinks    Comment:  "mixed drink q few years"  . Drug use: Never  . Sexual activity: Yes    Birth control/protection: Post-menopausal  Other Topics Concern  . Not on file  Social History Narrative   Married   Patient is right handed.   Patient rarely drinks caffeine.   Lives at home with husband in two story home. Husband washes laundry in the basement. They have 3 daughters and 7 grandchildren.    Social Determinants of Health   Financial Resource Strain:   . Difficulty of Paying Living Expenses:   Food Insecurity:   . Worried About Charity fundraiser in the Last Year:   . Arboriculturist in the Last Year:   Transportation Needs:   . Film/video editor (Medical):   Marland Kitchen Lack of Transportation (Non-Medical):   Physical Activity:   . Days of Exercise per Week:   . Minutes of Exercise per Session:   Stress:   . Feeling of Stress :   Social Connections:   .  Frequency of Communication with Friends and Family:   . Frequency of Social Gatherings with Friends and Family:   . Attends Religious Services:   . Active Member of Clubs or Organizations:   . Attends Archivist Meetings:   Marland Kitchen Marital Status:     Allergies:  Allergies  Allergen Reactions  . Amlodipine Swelling    Ankle swelling  . Atorvastatin Other (See Comments)    Myalgias   . Crestor [Rosuvastatin Calcium] Other (See Comments)    Stiffness and back pain  . Ezetimibe-Simvastatin Other (See Comments)    Leg cramps  . Sitagliptin Other (See Comments)  . Statins Other (See Comments)    Myalgias  . Celebrex [Celecoxib] Rash  . Levemir [Insulin Detemir]  Rash    Objective:    Vital Signs:   Temp:  [98 F (36.7 C)] 98 F (36.7 C) (07/07 1327) Pulse Rate:  [34-66] 62 (07/08 0801) Resp:  [10-26] 17 (07/08 0801) BP: (110-173)/(39-128) 153/128 (07/08 0831) SpO2:  [87 %-100 %] 99 % (07/08 0824) Weight:  [93 kg-95.3 kg] 93 kg (07/08 0756)    Weight change: Filed Weights   02/19/20 1356 02/20/20 0756  Weight: 95.3 kg 93 kg    Intake/Output:   Intake/Output Summary (Last 24 hours) at 02/20/2020 1000 Last data filed at 02/19/2020 1511 Gross per 24 hour  Intake 500 ml  Output --  Net 500 ml      Physical Exam    General:   No resp difficulty HEENT: normal Neck: supple. JVP difficult to assess due to body habitus. Carotids 2+ bilat; no bruits. No lymphadenopathy or thyromegaly appreciated. Cor: PMI nondisplaced. Regular rate & rhythm. No rubs, gallops or murmurs. Lungs: clear Abdomen: obese, soft, nontender, nondistended. No hepatosplenomegaly. No bruits or masses. Good bowel sounds. Extremities: no cyanosis, clubbing, rash, edema Neuro: alert & orientedx3, cranial nerves grossly intact. moves all 4 extremities w/o difficulty. Affect pleasant   Telemetry   Junctional Brady 50s   EKG    Junctional Rhythm on admit 40s   Labs   Basic Metabolic Panel: Recent Labs  Lab 02/19/20 1330 02/19/20 2143 02/20/20 0333  NA 138 140 142  K 5.0 4.9 4.4  CL 106  --  107  CO2 20*  --  24  GLUCOSE 473*  --  229*  BUN 62*  --  57*  CREATININE 3.29*  --  3.28*  CALCIUM 8.0*  --  8.6*  MG  --   --  2.2    Liver Function Tests: No results for input(s): AST, ALT, ALKPHOS, BILITOT, PROT, ALBUMIN in the last 168 hours. No results for input(s): LIPASE, AMYLASE in the last 168 hours. No results for input(s): AMMONIA in the last 168 hours.  CBC: Recent Labs  Lab 02/19/20 1330 02/19/20 2143  WBC 7.8  --   NEUTROABS 5.4  --   HGB 9.5* 10.9*  HCT 32.5* 32.0*  MCV 81.3  --   PLT 195  --     Cardiac Enzymes: No results for  input(s): CKTOTAL, CKMB, CKMBINDEX, TROPONINI in the last 168 hours.  BNP: BNP (last 3 results) Recent Labs    12/09/19 0624 02/19/20 1330  BNP 161.9* 330.5*    ProBNP (last 3 results) No results for input(s): PROBNP in the last 8760 hours.   CBG: Recent Labs  Lab 02/19/20 1623 02/19/20 2136 02/20/20 0151 02/20/20 0737  GLUCAP 385* 206* 242* 206*    Coagulation Studies: Recent Labs    02/19/20  1330  LABPROT 19.9*  INR 1.8*     Imaging   CT Head Wo Contrast  Result Date: 02/19/2020 CLINICAL DATA:  Encephalopathy EXAM: CT HEAD WITHOUT CONTRAST TECHNIQUE: Contiguous axial images were obtained from the base of the skull through the vertex without intravenous contrast. COMPARISON:  04/15/2019 FINDINGS: Brain: New small to moderate size area of hypoattenuation within the left cerebellar hemisphere. 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 There is no acute intracranial hemorrhage or significant mass effect. There is no extra-axial fluid collection. Ventricles and sulci are within normal limits in size and configuration. Vascular: There is atherosclerotic calcification at the skull base. Skull: Calvarium is unremarkable. Sinuses/Orbits: No acute finding. Other: None. IMPRESSION: New area of hypoattenuation within the left cerebellar hemisphere, which may reflect an age-indeterminate infarct. Suggest MRI for further evaluation. Electronically Signed   By: Macy Mis M.D.   On: 02/19/2020 15:36   DG Chest Portable 1 View  Result Date: 02/19/2020 CLINICAL DATA:  69 year old female with bradycardia. Recent myocardial infarction. EXAM: PORTABLE CHEST 1 VIEW COMPARISON:  Portable chest 01/16/2020 and earlier. FINDINGS: Portable AP semi upright view at 1351 hours. Stable cardiomegaly. Stable lung volumes. Pacer or resuscitation pads project over the left lower chest now. Allowing for portable technique the lungs are clear. No pneumothorax. Negative trachea. IMPRESSION: Stable  cardiomegaly. No acute cardiopulmonary abnormality. Electronically Signed   By: Genevie Ann M.D.   On: 02/19/2020 14:25      Medications:     Current Medications: . apixaban  5 mg Oral BID  . calcium carbonate  1,250 mg Oral Q breakfast  . fenofibrate  54 mg Oral Daily  . insulin aspart  0-15 Units Subcutaneous TID WC  . omega-3 acid ethyl esters  2 g Oral BID  . Tafamidis  61 mg Oral Daily  . vitamin B-12  100 mcg Oral Daily     Infusions: . sodium chloride          Assessment/Plan   1. Bradycardia  -Admit received atropine x2. Off amio and carvedilol  - Heart rate in 50s today.  - EP following.  2. Chronic Diastolic HF  - ECHO EF 32-99%.  - Volume status difficult to assess due to body habitus but creatinine up on admit.  - Diuretics held for now. Allow creatinine to drift back down.   3. CKD Stage IV  - Followed by Dr Joelyn Oms in the community.  -Creatinine baseline 2.5-2.7.  -Creatinine was up 3.3 on admit - Follow up BMET . Diuretics on hold.  4. Chronic A fib  - Junctional rhythm. Off nodal agents due to bradycardia.  - Continue elqiuis 5 mg twice a day   5. TTR Amyloidosis On tafamidis. Continue   6. AS S/P TAVR 2019      Length of Stay: 0  Amy Clegg, NP  02/20/2020, 10:00 AM  Advanced Heart Failure Team Pager [phone] (M-F; [place])  Please contact [facility] for night-coverage after hours (4p -7a ) and weekends on [email or web address]  Patient seen with NP, agree with the above note.   She went to the ER with weakness/fatigue, found to be in junctional bradycardia rate in 20s-30s initially. Coreg 25 mg bid and amiodarone 200 mg daily were stopped.  She is now in NSR with HR in 50s.  Creatinine up to 3.3.    General: NAD Neck: Thick, JVP 8-9 cm, no thyromegaly or thyroid nodule.  Lungs: Clear to auscultation bilaterally with normal respiratory effort.  CV: Nondisplaced PMI.  Heart regular S1/S2, no S3/S4, no murmur.  No peripheral edema.   Abdomen:  Soft, nontender, no hepatosplenomegaly, no distention.  Skin: Intact without lesions or rashes.  Neurologic: Alert and oriented x 3.  Psych: Normal affect. Extremities: No clubbing or cyanosis.  HEENT: Normal.   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  We have stopped Coreg and amiodarone for now, she is out of junctional bradycardia and in sinus brady in the 46s.  BP stable.  She does not tolerate atrial fibrillation well when she goes into it.  - Would leave off Coreg for now.  - If she remains stable, would start back low dose of amiodarone, 100 mg daily.   Hold torsemide for now with AKI, will need to eventually restart.   [name] 02/20/2020 1:25 PM

## 2020-02-20 NOTE — ED Notes (Signed)
Attempted Report 

## 2020-02-20 NOTE — Progress Notes (Signed)
Pt arrived from unit from ED. Will continue to monitor. BP 170/50(84) HR 53 Spo2 RR 19  94 3L [place] Pt. Oriented to unit   Phoebe Sharps, RN

## 2020-02-20 NOTE — ED Notes (Signed)
Patient was placed on NRB at 15L, patient stated she will keep it on only for a little while. Patient educated about her O2 sats dropping and the importance of getting the O2 on patient verbalized understanding.

## 2020-02-20 NOTE — Progress Notes (Addendum)
Inpatient Diabetes Program Recommendations  AACE/ADA: New Consensus Statement on Inpatient Glycemic Control (2015)  Target Ranges:  Prepandial:   less than 140 mg/dL      Peak postprandial:   less than 180 mg/dL (1-2 hours)      Critically ill patients:  140 - 180 mg/dL   Lab Results  Component Value Date   GLUCAP 322 (H) 02/20/2020   HGBA1C 10.2 (H) 01/16/2020    Review of Glycemic Control Results for RAGHAD, LORENZ (MRN [id number]) as of 02/20/2020 12:01  Ref. Range 02/19/2020 16:23 02/19/2020 21:36 02/20/2020 01:51 02/20/2020 07:37 02/20/2020 11:51  Glucose-Capillary Latest Ref Range: 70 - 99 mg/dL 385 (H)  Novolog 10 units given at 1517 pm another 10 units at 1729 pm. 206 (H) 242 (H) 206 (H)  Novolog 5 units 322 (H)   Diabetes history: DM 2 Outpatient Diabetes medications: NPH 60 units breakfast- 60 units lunch- 20 units supper, Novolin Regular insulin 20-30 units tid meal coverage Current orders for Inpatient glycemic control:  Novolog 0-15 units tid  A1c 10.2% on 6/3, uncontrolled  Inpatient Diabetes Program Recommendations:    -  Consider Levemir 10 units bid. -  Add Novolog 8 units tid meal coverage if eating >50% of meals  Addendum 1:21 pm Spoke with pt at bedside regarding A1c level of 10.2% and glucose control. Pt is followed by Dr. Chalmers Cater, Endocrinologist, for DM management and last saw her 1 month ago. Pt calls the office with her glucose trends every so often. Pt reports having another appointment in 1-2 weeks for follow up. Pt reports brittle glucose trends being elevated in the 400's in the evening and having hypoglycemia in the morning. Pt verified her home regimen.   Thanks,  Tama Headings RN, MSN, BC-ADM Inpatient Diabetes Coordinator Team Pager [phone] (8a-5p)

## 2020-02-20 NOTE — Progress Notes (Addendum)
Electrophysiology Rounding Note  Patient Name: [name] Date of Encounter: 02/20/2020  Primary Cardiologist: Minus Breeding, MD Electrophysiologist: Thompson Grayer, MD   Subjective   The patient is feeling much better this am. States near her baseline, but "slightly wheezy" from laying in bed. HRs have been stable ~ 50, into upper 40s while sleeping.   Inpatient Medications    Scheduled Meds: . apixaban  5 mg Oral BID  . calcium carbonate  1,250 mg Oral Q breakfast  . fenofibrate  54 mg Oral Daily  . insulin aspart  0-15 Units Subcutaneous TID WC  . omega-3 acid ethyl esters  2 g Oral BID  . Tafamidis  61 mg Oral Daily  . vitamin B-12  100 mcg Oral Daily   Continuous Infusions: . sodium chloride     PRN Meds: sodium chloride, acetaminophen, guaiFENesin, hydrALAZINE, ipratropium-albuterol, meclizine, ondansetron   Vital Signs    Vitals:   02/20/20 0756 02/20/20 0801 02/20/20 0824 02/20/20 0831  BP:  (!) 145/39  (!) 153/128  Pulse:  62    Resp:  17    Temp:      SpO2:  (!) 87% 99%   Weight: 93 kg     Height: 5\' 7"  (1.702 m)       Intake/Output Summary (Last 24 hours) at 02/20/2020 0938 Last data filed at 02/19/2020 1511 Gross per 24 hour  Intake 500 ml  Output --  Net 500 ml   Filed Weights   02/19/20 1356 02/20/20 0756  Weight: 95.3 kg 93 kg    Physical Exam    GEN- The patient is chronically ill appearing, alert and oriented x 3 today.   Head- normocephalic, atraumatic Eyes-  Sclera clear, conjunctiva pink Ears- hearing intact Oropharynx- clear Neck- supple Lungs- Normal work of breathing Heart- Regular and slow rate and rhythm, no murmurs, rubs or gallops GI- obese, soft, NT, ND, + BS Extremities- no clubbing, cyanosis, or edema Skin- no rash or lesion Psych- euthymic mood, full affect Neuro- strength and sensation are intact  Labs    CBC Recent Labs    02/19/20 1330 02/19/20 2143  WBC 7.8  --   NEUTROABS 5.4  --   HGB 9.5* 10.9*   HCT 32.5* 32.0*  MCV 81.3  --   PLT 195  --    Basic Metabolic Panel Recent Labs    02/19/20 1330 02/19/20 1330 02/19/20 2143 02/20/20 0333  NA 138   < > 140 142  K 5.0   < > 4.9 4.4  CL 106  --   --  107  CO2 20*  --   --  24  GLUCOSE 473*  --   --  229*  BUN 62*  --   --  57*  CREATININE 3.29*  --   --  3.28*  CALCIUM 8.0*  --   --  8.6*  MG  --   --   --  2.2   < > = values in this interval not displayed.   Liver Function Tests No results for input(s): AST, ALT, ALKPHOS, BILITOT, PROT, ALBUMIN in the last 72 hours. No results for input(s): LIPASE, AMYLASE in the last 72 hours. Cardiac Enzymes No results for input(s): CKTOTAL, CKMB, CKMBINDEX, TROPONINI in the last 72 hours.   Telemetry    Sinus brady upper 40s low 50s overnight. (personally reviewed)  Radiology    CT Head Wo Contrast  Result Date: 02/19/2020 CLINICAL DATA:  Encephalopathy EXAM: CT HEAD  WITHOUT CONTRAST TECHNIQUE: Contiguous axial images were obtained from the base of the skull through the vertex without intravenous contrast. COMPARISON:  04/15/2019 FINDINGS: Brain: New small to moderate size area of hypoattenuation within the left cerebellar hemisphere. 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 There is no acute intracranial hemorrhage or significant mass effect. There is no extra-axial fluid collection. Ventricles and sulci are within normal limits in size and configuration. Vascular: There is atherosclerotic calcification at the skull base. Skull: Calvarium is unremarkable. Sinuses/Orbits: No acute finding. Other: None. IMPRESSION: New area of hypoattenuation within the left cerebellar hemisphere, which may reflect an age-indeterminate infarct. Suggest MRI for further evaluation. Electronically Signed   By: Macy Mis M.D.   On: 02/19/2020 15:36   DG Chest Portable 1 View  Result Date: 02/19/2020 CLINICAL DATA:  69 year old female with bradycardia. Recent myocardial infarction. EXAM:  PORTABLE CHEST 1 VIEW COMPARISON:  Portable chest 01/16/2020 and earlier. FINDINGS: Portable AP semi upright view at 1351 hours. Stable cardiomegaly. Stable lung volumes. Pacer or resuscitation pads project over the left lower chest now. Allowing for portable technique the lungs are clear. No pneumothorax. Negative trachea. IMPRESSION: Stable cardiomegaly. No acute cardiopulmonary abnormality. Electronically Signed   By: Genevie Ann M.D.   On: 02/19/2020 14:25    Patient Profile     Ennis P Schermerhorn is a 69 y.o. 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 Marlou Porch.  Assessment & Plan    1.  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  Rates currently 50s while awake, upper 40s while sleeping.  No urgent indication for pacing and pt would prefer to avoid if possible.  With her complicated co-morbidities she is a less than ideal candidate for pacing, but it may come to that.  K 4.4  2. Persistent atrial fibrillation Currently in sinus bradycardia.  She is on eliquis for CHA2DS2VASC of at least 7   Seen by Dr. Rayann Heman and thought poor candidate for ablation.  May end up requiring pacing for tachy brady as above, but will try to manage on lower doses of medication first.   3. Chronic diastolic CHF  Echo 01/1442 with EF 60-65%, Grade 2 DD Her torsemide has been adjusted up and down lately with volume overloaded complicated by AKI.  CHF team has suggested cardiomems for more intense monitoring, but patient has been hesitant thus far.   4. 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 PYP study abnormal, myeloma panel negative She is  managed on tafamidis No change.   5. AS s/p TAVR 2019 Stable by Echo 11/2019  6. CKD IV Baseline creatinine appears around 3 recently, previously closer to 2.5-2.7 Creatinine 3.28 today.   For questions or updates, please contact [place] Please consult [email or web address] for contact info under Cardiology/STEMI.  Signed, Shirley Friar, PA-C  02/20/2020, 9:38 AM    I have seen, examined the patient, and reviewed the above assessment and plan.  Changes to above are made where necessary.  On exam, RRR.  The patient presents with iatrogenic sinus bradycardia. We will reduce amiodarone to 200mg  daily. Hold coreg.  We will consider resumption of  coreg at 6.25mg  BID if her heart rate improves. No indication for pacing at this time. Resume eliquis   Co Sign: Thompson Grayer, MD 02/20/2020 11:49 AM

## 2020-02-20 NOTE — Progress Notes (Addendum)
[facility]  PROGRESS NOTE  [name]:[id number] DOB: 05-13-1951 DOA: 02/19/2020 PCP: [name], DO   Brief HPI:   69 year old female with medical history of paroxysmal atrial fibrillation, CAD, hypertension, chronic diastolic CHF, aortic stenosis s/p TAVR, morbid obesity, CKD stage III, sleep apnea not on CPAP, diabetes mellitus type 2 presented with symptomatic bradycardia. Patient was recently hospitalized for new onset A. fib and was discharged on amiodarone and Coreg. Patient was recently put on hydralazine by her cardiologist. Soon after she took her medications yesterday morning she developed generalized weakness could not support herself and collapsed on the floor. Denied loss of consciousness. Per EMS patient glucose was in 40s and heart rate in 30s and 30s. EKG showed junctional rhythm. Patient was given atropine x2 and heart rate improved to 40s. EKG showed sinus bradycardia.    Subjective   Patient seen and examined, heart rate is improved to 50s this morning. Denies dizziness or chest pain.   Assessment/Plan:     1. Symptomatic bradycardia-likely medication induced, Coreg and amiodarone were held. TSH and cortisol pending. Heart rate has improved. Cardiology following. Will follow the recommendations. 2. Paroxysmal atrial fibrillation-currently in normal sinus rhythm. Coreg and admitted on hold given bradycardia as above. Continue anticoagulation with Eliquis. Cardiology consulted. 3. Chronic diastolic CHF-euvolemic. Lasix per cardiology. 4. History of OSA-she has not tolerated CPAP in the past. VBG showed PCO2 55.4. 5. CKD stage IV-creatinine stable, patient follows nephrology as outpatient. 6. Diabetes mellitus type 2-sliding scale insulin with NovoLog. Blood glucose elevated this morning. She had hypoglycemia episode at home. Will continue to monitor patient blood glucose. 7. Morbid obesity-s/p gastric bypass surgery.    SpO2: 98 % O2 Flow Rate  (L/min): 2 L/min   COVID-19 Labs  No results for input(s): DDIMER, FERRITIN, LDH, CRP in the last 72 hours.  Lab Results  Component Value Date   SARSCOV2NAA NEGATIVE 02/19/2020   [place] NEGATIVE 01/16/2020   [place] NEGATIVE 12/09/2019   SARSCOV2NAA NOT DETECTED 06/01/2019     CBG: Recent Labs  Lab 02/19/20 1623 02/19/20 2136 02/20/20 0151 02/20/20 0737 02/20/20 1151  GLUCAP 385* 206* 242* 206* 322*    CBC: Recent Labs  Lab 02/19/20 1330 02/19/20 2143  WBC 7.8  --   NEUTROABS 5.4  --   HGB 9.5* 10.9*  HCT 32.5* 32.0*  MCV 81.3  --   PLT 195  --     Basic Metabolic Panel: Recent Labs  Lab 02/19/20 1330 02/19/20 2143 02/20/20 0333  NA 138 140 142  K 5.0 4.9 4.4  CL 106  --  107  CO2 20*  --  24  GLUCOSE 473*  --  229*  BUN 62*  --  57*  CREATININE 3.29*  --  3.28*  CALCIUM 8.0*  --  8.6*  MG  --   --  2.2     DVT prophylaxis: Apixaban  Code Status: Full code  Family Communication: No family at bedside    Status is: Inpatient  Dispo: The patient is from: Home              Anticipated d/c is to: Home              Anticipated d/c date is: 02/21/2020              Patient currently not medically stable for discharge  Barrier to discharge-ongoing evaluation for bradycardia  Pressure Injury 04/16/19 Heel Right Stage II -  Partial thickness loss  of dermis presenting as a shallow open ulcer with a red, pink wound bed without slough. 3 X 2.5 cm (Active)  04/16/19 0230  Location: Heel  Location Orientation: Right  Staging: Stage II -  Partial thickness loss of dermis presenting as a shallow open ulcer with a red, pink wound bed without slough.  Wound Description (Comments): 3 X 2.5 cm  Present on Admission: Yes     Pressure Injury 04/16/19 Toe (Comment  which one) Left;Distal Stage II -  Partial thickness loss of dermis presenting as a shallow open ulcer with a red, pink wound bed without slough. .4 X .4 cm 2nd toe (Active)  04/16/19 0230   Location: Toe (Comment  which one)  Location Orientation: Left;Distal  Staging: Stage II -  Partial thickness loss of dermis presenting as a shallow open ulcer with a red, pink wound bed without slough.  Wound Description (Comments): .4 X .4 cm 2nd toe  Present on Admission: Yes     Pressure Injury 04/16/19 Toe (Comment  which one) Left Stage II -  Partial thickness loss of dermis presenting as a shallow open ulcer with a red, pink wound bed without slough. 1X1 3rd toe (Active)  04/16/19 0230  Location: Toe (Comment  which one)  Location Orientation: Left  Staging: Stage II -  Partial thickness loss of dermis presenting as a shallow open ulcer with a red, pink wound bed without slough.  Wound Description (Comments): 1X1 3rd toe  Present on Admission: Yes       Scheduled medications:  . apixaban  5 mg Oral BID  . calcium carbonate  1,250 mg Oral Q breakfast  . fenofibrate  54 mg Oral Daily  . insulin aspart  0-15 Units Subcutaneous TID WC  . omega-3 acid ethyl esters  2 g Oral BID  . Tafamidis  61 mg Oral Daily  . vitamin B-12  100 mcg Oral Daily    Consultants:  Cardiology  Procedures:  None  Antibiotics:   Anti-infectives (From admission, onward)   None       Objective   Vitals:   02/20/20 1159 02/20/20 1214 02/20/20 1244 02/20/20 1258  BP: (!) 143/118 (!) 111/52 (!) 117/98 94/76  Pulse: (!) 50 (!) 51 (!) 54 (!) 54  Resp: (!) 25 (!) 22 16 11   Temp:      SpO2: 100% 100% 93% 98%  Weight:      Height:        Intake/Output Summary (Last 24 hours) at 02/20/2020 1332 Last data filed at 02/19/2020 1511 Gross per 24 hour  Intake 500 ml  Output --  Net 500 ml    07/06 1901 - 07/08 0700 In: 500  Out: -   Filed Weights   02/19/20 1356 02/20/20 0756  Weight: 95.3 kg 93 kg    Physical Examination:    General: Appears in no acute distress  Cardiovascular: S1-S2, regular, no murmur auscultated  Respiratory: Clear to auscultation bilaterally, no  wheezing or crackles auscultated  Abdomen: Abdomen is soft, nontender, no organomegaly  Extremities: No edema of the lower extremities  Neurologic: Alert, oriented x3, intact insight and judgment    Data Reviewed:   Recent Results (from the past 240 hour(s))  SARS Coronavirus 2 by RT PCR (hospital order, performed in [place] hospital lab) Nasopharyngeal Nasopharyngeal Swab     Status: None   Collection Time: 02/19/20  1:19 PM   Specimen: Nasopharyngeal Swab  Result Value Ref Range Status  SARS Coronavirus 2 NEGATIVE NEGATIVE Final    Comment: (NOTE) SARS-CoV-2 target nucleic acids are NOT DETECTED.  The SARS-CoV-2 RNA is generally detectable in upper and lower respiratory specimens during the acute phase of infection. The lowest concentration of SARS-CoV-2 viral copies this assay can detect is 250 copies / mL. A negative result does not preclude SARS-CoV-2 infection and should not be used as the sole basis for treatment or other patient management decisions.  A negative result may occur with improper specimen collection / handling, submission of specimen other than nasopharyngeal swab, presence of viral mutation(s) within the areas targeted by this assay, and inadequate number of viral copies (<250 copies / mL). A negative result must be combined with clinical observations, patient history, and epidemiological information.  Fact Sheet for Patients:   StrictlyIdeas.no  Fact Sheet for Healthcare Providers: BankingDealers.co.za  This test is not yet approved or  cleared by the [place] FDA and has been authorized for detection and/or diagnosis of SARS-CoV-2 by FDA under an Emergency Use Authorization (EUA).  This EUA will remain in effect (meaning this test can be used) for the duration of the COVID-19 declaration under Section 564(b)(1) of the Act, 21 U.S.C. section 360bbb-3(b)(1), unless the authorization is terminated  or revoked sooner.  Performed at [facility], [place] [address], [place], [place] [address]     No results for input(s): LIPASE, AMYLASE in the last 168 hours. No results for input(s): AMMONIA in the last 168 hours.  Cardiac Enzymes: No results for input(s): CKTOTAL, CKMB, CKMBINDEX, TROPONINI in the last 168 hours. BNP (last 3 results) Recent Labs    12/09/19 0624 02/19/20 1330  BNP 161.9* 330.5*    ProBNP (last 3 results) No results for input(s): PROBNP in the last 8760 hours.  Studies:  CT Head Wo Contrast  Result Date: 02/19/2020 CLINICAL DATA:  Encephalopathy EXAM: CT HEAD WITHOUT CONTRAST TECHNIQUE: Contiguous axial images were obtained from the base of the skull through the vertex without intravenous contrast. COMPARISON:  04/15/2019 FINDINGS: Brain: New small to moderate size area of hypoattenuation within the left cerebellar hemisphere. 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 There is no acute intracranial hemorrhage or significant mass effect. There is no extra-axial fluid collection. Ventricles and sulci are within normal limits in size and configuration. Vascular: There is atherosclerotic calcification at the skull base. Skull: Calvarium is unremarkable. Sinuses/Orbits: No acute finding. Other: None. IMPRESSION: New area of hypoattenuation within the left cerebellar hemisphere, which may reflect an age-indeterminate infarct. Suggest MRI for further evaluation. Electronically Signed   By: Macy Mis M.D.   On: 02/19/2020 15:36   DG Chest Portable 1 View  Result Date: 02/19/2020 CLINICAL DATA:  69 year old female with bradycardia. Recent myocardial infarction. EXAM: PORTABLE CHEST 1 VIEW COMPARISON:  Portable chest 01/16/2020 and earlier. FINDINGS: Portable AP semi upright view at 1351 hours. Stable cardiomegaly. Stable lung volumes. Pacer or resuscitation pads project over the left lower chest now. Allowing for portable technique the lungs are clear. No  pneumothorax. Negative trachea. IMPRESSION: Stable cardiomegaly. No acute cardiopulmonary abnormality. Electronically Signed   By: Genevie Ann M.D.   On: 02/19/2020 14:25       [place]   [facility] If 7PM-7AM, please contact night-coverage at [email or web address], Office  [phone]   02/20/2020, 1:32 PM  LOS: 0 days

## 2020-02-20 NOTE — Progress Notes (Signed)
Pt placed on BIPAP 11/4 BUR 12 50% per SpO2 in the 80's on 13L HFNC, and pt very sleepy. Pt tolerating well at this time. RT will continue to monitor and make adjustments as needed.

## 2020-02-20 NOTE — ED Notes (Signed)
Ordered breakfast--[name] 

## 2020-02-20 NOTE — ED Notes (Signed)
Patient O2 sats dropping in the 80s MD made aware. Patient stated she has a CPAP at home. Patient stated she would not take CPAP here.

## 2020-02-21 ENCOUNTER — Inpatient Hospital Stay (HOSPITAL_COMMUNITY): Payer: Medicare Other

## 2020-02-21 DIAGNOSIS — I5033 Acute on chronic diastolic (congestive) heart failure: Secondary | ICD-10-CM

## 2020-02-21 LAB — GLUCOSE, CAPILLARY
Glucose-Capillary: 227 mg/dL — ABNORMAL HIGH (ref 70–99)
Glucose-Capillary: 419 mg/dL — ABNORMAL HIGH (ref 70–99)
Glucose-Capillary: 445 mg/dL — ABNORMAL HIGH (ref 70–99)

## 2020-02-21 LAB — BASIC METABOLIC PANEL
Anion gap: 9 (ref 5–15)
BUN: 46 mg/dL — ABNORMAL HIGH (ref 8–23)
CO2: 25 mmol/L (ref 22–32)
Calcium: 8.8 mg/dL — ABNORMAL LOW (ref 8.9–10.3)
Chloride: 110 mmol/L (ref 98–111)
Creatinine, Ser: 2.98 mg/dL — ABNORMAL HIGH (ref 0.44–1.00)
GFR calc Af Amer: 18 mL/min — ABNORMAL LOW (ref >60)
GFR calc non Af Amer: 15 mL/min — ABNORMAL LOW (ref >60)
Glucose, Bld: 249 mg/dL — ABNORMAL HIGH (ref 70–99)
Potassium: 4.2 mmol/L (ref 3.5–5.1)
Sodium: 144 mmol/L (ref 135–145)

## 2020-02-21 LAB — BRAIN NATRIURETIC PEPTIDE: B Natriuretic Peptide: 538.2 pg/mL — ABNORMAL HIGH (ref 0.0–100.0)

## 2020-02-21 LAB — BLOOD GAS, ARTERIAL
Acid-Base Excess: 1.1 mmol/L (ref 0.0–2.0)
Bicarbonate: 25.1 mmol/L (ref 20.0–28.0)
Drawn by: 35849
FIO2: 80
O2 Saturation: 93.2 %
Patient temperature: 37
pCO2 arterial: 39.1 mmHg (ref 32.0–48.0)
pH, Arterial: 7.423 (ref 7.350–7.450)
pO2, Arterial: 65.9 mmHg — ABNORMAL LOW (ref 83.0–108.0)

## 2020-02-21 MED ORDER — INSULIN NPH (HUMAN) (ISOPHANE) 100 UNIT/ML ~~LOC~~ SUSP
20.0000 [IU] | Freq: Two times a day (BID) | SUBCUTANEOUS | Status: DC
  Administered 2020-02-21 – 2020-02-22 (×2): 20 [IU] via SUBCUTANEOUS
  Filled 2020-02-21: qty 10

## 2020-02-21 MED ORDER — FUROSEMIDE 10 MG/ML IJ SOLN
60.0000 mg | Freq: Two times a day (BID) | INTRAMUSCULAR | Status: DC
  Administered 2020-02-21 – 2020-02-23 (×5): 60 mg via INTRAVENOUS
  Filled 2020-02-21 (×5): qty 6

## 2020-02-21 MED ORDER — TAFAMIDIS 61 MG PO CAPS: 61.0000 mg | ORAL_CAPSULE | Freq: Every day | ORAL | Status: DC

## 2020-02-21 MED ORDER — SODIUM CHLORIDE 0.9 % IV SOLN
1.0000 g | INTRAVENOUS | Status: AC
  Administered 2020-02-21 – 2020-02-25 (×5): 1 g via INTRAVENOUS
  Filled 2020-02-21 (×4): qty 10
  Filled 2020-02-21: qty 1

## 2020-02-21 MED ORDER — INSULIN NPH (HUMAN) (ISOPHANE) 100 UNIT/ML ~~LOC~~ SUSP: 20.0000 [IU] | Freq: Two times a day (BID) | SUBCUTANEOUS | Status: DC

## 2020-02-21 MED ORDER — TAFAMIDIS 61 MG PO CAPS
61.0000 mg | ORAL_CAPSULE | Freq: Every day | ORAL | Status: DC
  Administered 2020-02-21: 61 mg via ORAL
  Filled 2020-02-21: qty 1

## 2020-02-21 MED ORDER — HYDRALAZINE HCL 25 MG PO TABS
25.0000 mg | ORAL_TABLET | Freq: Three times a day (TID) | ORAL | Status: DC
  Administered 2020-02-21 – 2020-02-26 (×15): 25 mg via ORAL
  Filled 2020-02-21 (×15): qty 1

## 2020-02-21 MED ORDER — FUROSEMIDE 10 MG/ML IJ SOLN: 40.0000 mg | Freq: Two times a day (BID) | INTRAMUSCULAR | Status: DC

## 2020-02-21 NOTE — Progress Notes (Signed)
Patient ID: [name], female   DOB: [birth year], 69 y.o.   MRN: [id number]     Advanced Heart Failure Rounding Note  PCP-Cardiologist: Minus Breeding, MD   Subjective:    Oxygen saturation low last night, was started on Bipap.  I took her off Bipap this morning but oxygen saturation dropped to 80s on 13 L [place].   HR now in 60s, NSR.  BP elevated.    Objective:   Weight Range: 93 kg Body mass index is 32.11 kg/m.   Vital Signs:   Temp:  [97.6 F (36.4 C)-98.4 F (36.9 C)] 98.4 F (36.9 C) (07/09 0332) Pulse Rate:  [50-66] 62 (07/09 0834) Resp:  [11-25] 23 (07/09 0834) BP: (94-176)/(36-118) 166/47 (07/09 0332) SpO2:  [82 %-100 %] 93 % (07/09 0834) FiO2 (%):  [80 %] 80 % (07/09 0834) Last BM Date: 02/19/20  Weight change: Filed Weights   02/19/20 1356 02/20/20 0756  Weight: 95.3 kg 93 kg    Intake/Output:  No intake or output data in the 24 hours ending 02/21/20 0853    Physical Exam    General:  Well appearing. No resp difficulty HEENT: Normal Neck: Thick. JVP 10-12 cm. Carotids 2+ bilat; no bruits. No lymphadenopathy or thyromegaly appreciated. Cor: PMI nondisplaced. Regular rate & rhythm. No rubs, gallops or murmurs. Lungs: Clear Abdomen: Soft, nontender, nondistended. No hepatosplenomegaly. No bruits or masses. Good bowel sounds. Extremities: No cyanosis, clubbing, rash, edema Neuro: Alert & orientedx3, cranial nerves grossly intact. moves all 4 extremities w/o difficulty. Affect pleasant   Telemetry   NSR 60s, personally reviewed.   Labs    CBC Recent Labs    02/19/20 1330 02/19/20 2143  WBC 7.8  --   NEUTROABS 5.4  --   HGB 9.5* 10.9*  HCT 32.5* 32.0*  MCV 81.3  --   PLT 195  --    Basic Metabolic Panel Recent Labs    02/20/20 0333 02/21/20 0322  NA 142 144  K 4.4 4.2  CL 107 110  CO2 24 25  GLUCOSE 229* 249*  BUN 57* 46*  CREATININE 3.28* 2.98*  CALCIUM 8.6* 8.8*  MG 2.2  --    Liver Function Tests No results for input(s):  AST, ALT, ALKPHOS, BILITOT, PROT, ALBUMIN in the last 72 hours. No results for input(s): LIPASE, AMYLASE in the last 72 hours. Cardiac Enzymes No results for input(s): CKTOTAL, CKMB, CKMBINDEX, TROPONINI in the last 72 hours.  BNP: BNP (last 3 results) Recent Labs    12/09/19 0624 02/19/20 1330  BNP 161.9* 330.5*    ProBNP (last 3 results) No results for input(s): PROBNP in the last 8760 hours.   D-Dimer No results for input(s): DDIMER in the last 72 hours. Hemoglobin A1C No results for input(s): HGBA1C in the last 72 hours. Fasting Lipid Panel No results for input(s): CHOL, HDL, LDLCALC, TRIG, CHOLHDL, LDLDIRECT in the last 72 hours. Thyroid Function Tests No results for input(s): TSH, T4TOTAL, T3FREE, THYROIDAB in the last 72 hours.  Invalid input(s): FREET3  Other results:   Imaging     No results found.   Medications:     Scheduled Medications: . amiodarone  200 mg Oral Daily  . apixaban  5 mg Oral BID  . calcium carbonate  1,250 mg Oral Q breakfast  . fenofibrate  54 mg Oral Daily  . furosemide  40 mg Intravenous BID  . hydrALAZINE  25 mg Oral Q8H  . insulin aspart  0-15 Units Subcutaneous TID  WC  . ipratropium  0.5 mg Nebulization TID  . levalbuterol  1.25 mg Nebulization TID  . omega-3 acid ethyl esters  2 g Oral BID  . Tafamidis  61 mg Oral Daily  . vitamin B-12  100 mcg Oral Daily     Infusions: . sodium chloride       PRN Medications:  sodium chloride, acetaminophen, guaiFENesin, hydrALAZINE, levalbuterol, meclizine, ondansetron    Assessment/Plan   1. Tachy-brady syndrome: Admitted with junctional bradycardia on amiodarone + Coreg.  Now off Coreg and on amiodarone at low dose, 200 mg daily.  She is in NSR in 60s.  - May end up needing PPM but continue to follow for now.  - Continue amiodarone 200 mg daily.  - Would keep her off Coreg.  2. AKI on CKD stage 4: Creatinine down to 2.98 today which is not far off her baseline.   -  Avoid nephrotoxic agents.  3. CAD: No chest pain.  - No ASA given apixaban use.  - Continue pitivastatin and Praluent at home.    - Continue fenofibrate and vascepa.  4. HTN: BP elevated.   - Restart hydralazine at 25 mg tid.  5. PAD: S/p PTCA to right popliteal and right AT in 7/20.  Now with left distal SFA-TP trunk bypass and left TMA in 2/21.   - She has followup with [facility]. 6. 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  - She is on tafamidis, continue   7. 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  8. 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   9. UTI: UA suggestive of UTI, per primary service.  10. Acute on chronic diastolic CHF:  Echo in 1/49 showed EF 60-65%, moderate LVH, RV normal, s/p TAVR with stable bioprosthetic aortic valve. I think that she is volume overloaded on exam. Oxygen saturation running low (not on oxygen at home).  This is complicated by AKI on CKD 3/4.  - Lasix 60 mg IV bid today. Follow creatinine closely.  - CXR and ABG today.  - Continue supplemental oxygen versus Bipap as needed.  11. OSA: She has baseline OSA but has been unable to tolerate CPAP.   Length of Stay: 1  [name], MD  02/21/2020, 8:53 AM  Advanced Heart Failure Team Pager [phone] (M-F; 7a - 4p)  Please contact [facility] for night-coverage after hours (4p -7a ) and weekends on [email or web address]

## 2020-02-21 NOTE — Progress Notes (Addendum)
Electrophysiology Rounding Note  Patient Name: [name] Date of Encounter: 02/21/2020  Primary Cardiologist: Minus Breeding, MD Electrophysiologist: Thompson Grayer, MD   Subjective   The patient is sleeping on CPAP this am, but rouses to conversation.   Overall doing better. HRs have stabilized into the 60s. BP has been elevated  Inpatient Medications    Scheduled Meds: . amiodarone  200 mg Oral Daily  . apixaban  5 mg Oral BID  . calcium carbonate  1,250 mg Oral Q breakfast  . fenofibrate  54 mg Oral Daily  . insulin aspart  0-15 Units Subcutaneous TID WC  . ipratropium  0.5 mg Nebulization TID  . levalbuterol  1.25 mg Nebulization TID  . omega-3 acid ethyl esters  2 g Oral BID  . Tafamidis  61 mg Oral Daily  . vitamin B-12  100 mcg Oral Daily   Continuous Infusions: . sodium chloride     PRN Meds: sodium chloride, acetaminophen, guaiFENesin, hydrALAZINE, levalbuterol, meclizine, ondansetron   Vital Signs    Vitals:   02/20/20 2332 02/21/20 0332 02/21/20 0421 02/21/20 0834  BP: (!) 170/63 (!) 166/47    Pulse: 63 63 66 62  Resp: (!) 24 (!) 23 (!) 24 (!) 23  Temp: 98 F (36.7 C) 98.4 F (36.9 C)    TempSrc: Axillary Axillary    SpO2: 97% 93% 92% 93%  Weight:      Height:       No intake or output data in the 24 hours ending 02/21/20 0848 Filed Weights   02/19/20 1356 02/20/20 0756  Weight: 95.3 kg 93 kg    Physical Exam    GEN- The patient is fatigued appearing, alert and oriented x 3 today.   Head- normocephalic, atraumatic Eyes-  Sclera clear, conjunctiva pink Ears- hearing intact Oropharynx- clear Neck- supple Lungs- Clear to ausculation bilaterally, normal work of breathing Heart- Regular, slightly brady rate and rhythm, no murmurs, rubs or gallops GI- soft, NT, ND, + BS Extremities- no clubbing, cyanosis, or edema Skin- no rash or lesion Psych- flat affect  Labs    CBC Recent Labs    02/19/20 1330 02/19/20 2143  WBC 7.8  --     NEUTROABS 5.4  --   HGB 9.5* 10.9*  HCT 32.5* 32.0*  MCV 81.3  --   PLT 195  --    Basic Metabolic Panel Recent Labs    02/20/20 0333 02/21/20 0322  NA 142 144  K 4.4 4.2  CL 107 110  CO2 24 25  GLUCOSE 229* 249*  BUN 57* 46*  CREATININE 3.28* 2.98*  CALCIUM 8.6* 8.8*  MG 2.2  --    Liver Function Tests No results for input(s): AST, ALT, ALKPHOS, BILITOT, PROT, ALBUMIN in the last 72 hours. No results for input(s): LIPASE, AMYLASE in the last 72 hours. Cardiac Enzymes No results for input(s): CKTOTAL, CKMB, CKMBINDEX, TROPONINI in the last 72 hours.   Telemetry    Sinus brady/NSR 50-60s (personally reviewed)  Radiology    CT Head Wo Contrast  Result Date: 02/19/2020 CLINICAL DATA:  Encephalopathy EXAM: CT HEAD WITHOUT CONTRAST TECHNIQUE: Contiguous axial images were obtained from the base of the skull through the vertex without intravenous contrast. COMPARISON:  04/15/2019 FINDINGS: Brain: New small to moderate size area of hypoattenuation within the left cerebellar hemisphere. 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 There is no acute intracranial hemorrhage or significant mass effect. There is no extra-axial fluid collection. Ventricles and sulci  are within normal limits in size and configuration. Vascular: There is atherosclerotic calcification at the skull base. Skull: Calvarium is unremarkable. Sinuses/Orbits: No acute finding. Other: None. IMPRESSION: New area of hypoattenuation within the left cerebellar hemisphere, which may reflect an age-indeterminate infarct. Suggest MRI for further evaluation. Electronically Signed   By: Macy Mis M.D.   On: 02/19/2020 15:36   DG Chest Portable 1 View  Result Date: 02/19/2020 CLINICAL DATA:  69 year old female with bradycardia. Recent myocardial infarction. EXAM: PORTABLE CHEST 1 VIEW COMPARISON:  Portable chest 01/16/2020 and earlier. FINDINGS: Portable AP semi upright view at 1351 hours. Stable cardiomegaly. Stable  lung volumes. Pacer or resuscitation pads project over the left lower chest now. Allowing for portable technique the lungs are clear. No pneumothorax. Negative trachea. IMPRESSION: Stable cardiomegaly. No acute cardiopulmonary abnormality. Electronically Signed   By: Genevie Ann M.D.   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 Marlou Porch.  Assessment & Plan    1.Junctional bradycardia -> Sinus bradycardia This has improved with holding coreg and amiodarone.  Stable back on amiodarone 200 mg daily (she was on 400 mg BID PTA) Can consider resuming low dose coreg (6.25 mg BID) once OK per CHF team. With her complicated co-morbidities she is a less than ideal candidate for pacing, but it may come to that.   2. Persistent atrial fibrillation Currently in sinus bradycardia.  She is on eliquis forCHA2DS2VASC ofat least 7 Seen by Dr. Rayann Heman and thought poor candidate for ablation.  Amiodarone resumed.  Can consider low dose BB once OK per CHF team.   May end up requiring pacing for tachy brady as above, but will try to manage on lower doses of medication first.   3. Chronic diastolic CHF  Echo 08/6107 with EF 60-65%, Grade 2 DD Her torsemide has been adjusted up and down lately with volume overloaded complicated by AKI.  CHF following.   4. 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 PYP study abnormal, myeloma panel negative She is managed on tafamidis No change.   5. AS s/p TAVR 2019 Stable by Echo 11/2019  6. CKD IV Baseline creatinineappears around3recently, previously closer to  2.5-2.7 Creatinine 2.98 today.   Her junctional bradycardia has resolved and is back on amiodarone. Would likely tolerate low dose BB.  EP to see as needed while here. Will make close follow up in AF clinic.   For questions or updates, please contact [facility] Please consult [email or web address] for contact info under Cardiology/STEMI.  Signed, Shirley Friar, PA-C  02/21/2020, 8:48 AM

## 2020-02-21 NOTE — Progress Notes (Signed)
Inpatient Diabetes Program Recommendations  AACE/ADA: New Consensus Statement on Inpatient Glycemic Control (2015)  Target Ranges:  Prepandial:   less than 140 mg/dL      Peak postprandial:   less than 180 mg/dL (1-2 hours)      Critically ill patients:  140 - 180 mg/dL   Lab Results  Component Value Date   GLUCAP 227 (H) 02/21/2020   HGBA1C 10.2 (H) 01/16/2020    Review of Glycemic Control Results for [name], [name] (MRN [id number]) as of 02/20/2020 12:01  Ref. Range 02/19/2020 16:23 02/19/2020 21:36 02/20/2020 01:51 02/20/2020 07:37 02/20/2020 11:51  Glucose-Capillary Latest Ref Range: 70 - 99 mg/dL 385 (H)  Novolog 10 units given at 1517 pm another 10 units at 1729 pm. 206 (H) 242 (H) 206 (H)  Novolog 5 units 322 (H)   Results for [name], [name] (MRN [id number]) as of 02/21/2020 09:40  Ref. Range 02/20/2020 11:51 02/20/2020 17:01 02/20/2020 20:34 02/21/2020 06:00  Glucose-Capillary Latest Ref Range: 70 - 99 mg/dL 322 (H) 374 (H) 271 (H) 227 (H)   Diabetes history: DM 2 Outpatient Diabetes medications: NPH 60 units breakfast- 60 units lunch- 20 units supper, Novolin Regular insulin 20-30 units tid meal coverage Current orders for Inpatient glycemic control:  Novolog 0-15 units tid  A1c 10.2% on 6/3, uncontrolled  Inpatient Diabetes Program Recommendations:    -  Consider Levemir 10 units bid. -  Add Novolog 8 units tid meal coverage if eating >50% of meals  Thanks,  Tama Headings RN, MSN, BC-ADM Inpatient Diabetes Coordinator Team Pager [phone] (8a-5p)

## 2020-02-21 NOTE — Progress Notes (Signed)
Patient refused the use of BIPAP for the evening. RT will continue to monitor patient.

## 2020-02-21 NOTE — Progress Notes (Signed)
[facility]  PROGRESS NOTE  [name] FYB:[id number] DOB: 12-04-1950 DOA: 02/19/2020 PCP: [name], DO   Brief HPI:   69 year old female with medical history of paroxysmal atrial fibrillation, CAD, hypertension, chronic diastolic CHF, aortic stenosis s/p TAVR, morbid obesity, CKD stage III, sleep apnea not on CPAP, diabetes mellitus type 2 presented with symptomatic bradycardia. Patient was recently hospitalized for new onset A. fib and was discharged on amiodarone and Coreg. Patient was recently put on hydralazine by her cardiologist. Soon after she took her medications yesterday morning she developed generalized weakness could not support herself and collapsed on the floor. Denied loss of consciousness. Per EMS patient glucose was in 40s and heart rate in 30s and 30s. EKG showed junctional rhythm. Patient was given atropine x2 and heart rate improved to 40s. EKG showed sinus bradycardia.    Subjective   Patient seen and examined, became hypoxic last night had to be put on BiPAP.  She is off BiPAP this morning.  Still requiring oxygen 12 to 13 L/min via nasal cannula   Assessment/Plan:     1. Symptomatic bradycardia-likely medication induced, Coreg and amiodarone were held. TSH and cortisol pending. Heart rate has improved. Cardiology following. Will follow the recommendations.  Amiodarone 200 mg daily has been restarted.  Coreg has been discontinued. 2. Paroxysmal atrial fibrillation-currently in normal sinus rhythm. Coreg and amiodarone were held due to bradycardia as above. Continue anticoagulation with Eliquis. Cardiology following. 3. Chronic diastolic CHF-euvolemic. Lasix per cardiology. 4. History of OSA-she has not tolerated CPAP in the past. VBG showed PCO2 55.4. 5. Acute kidney injury on CKD stage IV-creatinine is improving.  Today creatinine is 2.98.  Patient's baseline creatinine is around 2.5-2.7.  Will follow renal function in a.m. 6. Diabetes mellitus  type 2-sliding scale insulin with NovoLog. Blood glucose elevated this morning. She had hypoglycemia episode at home. Will continue to monitor patient blood glucose. 7. Morbid obesity-s/p gastric bypass surgery.    SpO2: 92 % O2 Flow Rate (L/min): 15 L/min FiO2 (%): 80 %   COVID-19 Labs  No results for input(s): DDIMER, FERRITIN, LDH, CRP in the last 72 hours.  Lab Results  Component Value Date   SARSCOV2NAA NEGATIVE 02/19/2020   [place] NEGATIVE 01/16/2020   [place] NEGATIVE 12/09/2019   SARSCOV2NAA NOT DETECTED 06/01/2019     CBG: Recent Labs  Lab 02/20/20 0737 02/20/20 1151 02/20/20 1701 02/20/20 2034 02/21/20 0600  GLUCAP 206* 322* 374* 271* 227*    CBC: Recent Labs  Lab 02/19/20 1330 02/19/20 2143  WBC 7.8  --   NEUTROABS 5.4  --   HGB 9.5* 10.9*  HCT 32.5* 32.0*  MCV 81.3  --   PLT 195  --     Basic Metabolic Panel: Recent Labs  Lab 02/19/20 1330 02/19/20 2143 02/20/20 0333 02/21/20 0322  NA 138 140 142 144  K 5.0 4.9 4.4 4.2  CL 106  --  107 110  CO2 20*  --  24 25  GLUCOSE 473*  --  229* 249*  BUN 62*  --  57* 46*  CREATININE 3.29*  --  3.28* 2.98*  CALCIUM 8.0*  --  8.6* 8.8*  MG  --   --  2.2  --      DVT prophylaxis: Apixaban  Code Status: Full code  Family Communication: No family at bedside    Status is: Inpatient  Dispo: The patient is from: Home  Anticipated d/c is to: Home              Anticipated d/c date is: 02/21/2020              Patient currently not medically stable for discharge  Barrier to discharge-ongoing evaluation for bradycardia  Pressure Injury 04/16/19 Heel Right Stage II -  Partial thickness loss of dermis presenting as a shallow open ulcer with a red, pink wound bed without slough. 3 X 2.5 cm (Active)  04/16/19 0230  Location: Heel  Location Orientation: Right  Staging: Stage II -  Partial thickness loss of dermis presenting as a shallow open ulcer with a red, pink wound bed without  slough.  Wound Description (Comments): 3 X 2.5 cm  Present on Admission: Yes     Pressure Injury 04/16/19 Toe (Comment  which one) Left;Distal Stage II -  Partial thickness loss of dermis presenting as a shallow open ulcer with a red, pink wound bed without slough. .4 X .4 cm 2nd toe (Active)  04/16/19 0230  Location: Toe (Comment  which one)  Location Orientation: Left;Distal  Staging: Stage II -  Partial thickness loss of dermis presenting as a shallow open ulcer with a red, pink wound bed without slough.  Wound Description (Comments): .4 X .4 cm 2nd toe  Present on Admission: Yes     Pressure Injury 04/16/19 Toe (Comment  which one) Left Stage II -  Partial thickness loss of dermis presenting as a shallow open ulcer with a red, pink wound bed without slough. 1X1 3rd toe (Active)  04/16/19 0230  Location: Toe (Comment  which one)  Location Orientation: Left  Staging: Stage II -  Partial thickness loss of dermis presenting as a shallow open ulcer with a red, pink wound bed without slough.  Wound Description (Comments): 1X1 3rd toe  Present on Admission: Yes       Scheduled medications:  . amiodarone  200 mg Oral Daily  . apixaban  5 mg Oral BID  . calcium carbonate  1,250 mg Oral Q breakfast  . fenofibrate  54 mg Oral Daily  . furosemide  60 mg Intravenous BID  . hydrALAZINE  25 mg Oral Q8H  . insulin aspart  0-15 Units Subcutaneous TID WC  . ipratropium  0.5 mg Nebulization TID  . levalbuterol  1.25 mg Nebulization TID  . omega-3 acid ethyl esters  2 g Oral BID  . [START ON 02/22/2020] Tafamidis  61 mg Oral Daily  . vitamin B-12  100 mcg Oral Daily    Consultants:  Cardiology  Procedures:  None  Antibiotics:   Anti-infectives (From admission, onward)   Start     Dose/Rate Route Frequency Ordered Stop   02/21/20 1200  cefTRIAXone (ROCEPHIN) 1 g in sodium chloride 0.9 % 100 mL IVPB     Discontinue     1 g 200 mL/hr over 30 Minutes Intravenous Every 24 hours  02/21/20 1050         Objective   Vitals:   02/21/20 0421 02/21/20 0834 02/21/20 0915 02/21/20 1140  BP:   (!) 181/57 (!) 164/98  Pulse: 66 62 66 78  Resp: (!) 24 (!) 23 (!) 22 19  Temp:   97.9 F (36.6 C) 98 F (36.7 C)  TempSrc:   Oral Oral  SpO2: 92% 93% 91% 92%  Weight:      Height:        Intake/Output Summary (Last 24 hours) at 02/21/2020 1506 Last data  filed at 02/21/2020 1139 Gross per 24 hour  Intake --  Output 900 ml  Net -900 ml    No intake/output data recorded.  Filed Weights   02/19/20 1356 02/20/20 0756  Weight: 95.3 kg 93 kg    Physical Examination:   General-appears in no acute distress  Heart-S1-S2, regular, no murmur auscultated  Lungs-clear to auscultation bilaterally, no wheezing or crackles auscultated  Abdomen-soft, nontender, no organomegaly  Extremities-no edema in the lower extremities  Neuro-alert, oriented x3, no focal deficit noted    Data Reviewed:   Recent Results (from the past 240 hour(s))  SARS Coronavirus 2 by RT PCR (hospital order, performed in [facility] hospital lab) Nasopharyngeal Nasopharyngeal Swab     Status: None   Collection Time: 02/19/20  1:19 PM   Specimen: Nasopharyngeal Swab  Result Value Ref Range Status   SARS Coronavirus 2 NEGATIVE NEGATIVE Final    Comment: (NOTE) SARS-CoV-2 target nucleic acids are NOT DETECTED.  The SARS-CoV-2 RNA is generally detectable in upper and lower respiratory specimens during the acute phase of infection. The lowest concentration of SARS-CoV-2 viral copies this assay can detect is 250 copies / mL. A negative result does not preclude SARS-CoV-2 infection and should not be used as the sole basis for treatment or other patient management decisions.  A negative result may occur with improper specimen collection / handling, submission of specimen other than nasopharyngeal swab, presence of viral mutation(s) within the areas targeted by this assay, and inadequate number of  viral copies (<250 copies / mL). A negative result must be combined with clinical observations, patient history, and epidemiological information.  Fact Sheet for Patients:   StrictlyIdeas.no  Fact Sheet for Healthcare Providers: BankingDealers.co.za  This test is not yet approved or  cleared by the [place] FDA and has been authorized for detection and/or diagnosis of SARS-CoV-2 by FDA under an Emergency Use Authorization (EUA).  This EUA will remain in effect (meaning this test can be used) for the duration of the COVID-19 declaration under Section 564(b)(1) of the Act, 21 U.S.C. section 360bbb-3(b)(1), unless the authorization is terminated or revoked sooner.  Performed at [facility], [place] [address], [place],  [address]     No results for input(s): LIPASE, AMYLASE in the last 168 hours. No results for input(s): AMMONIA in the last 168 hours.  Cardiac Enzymes: No results for input(s): CKTOTAL, CKMB, CKMBINDEX, TROPONINI in the last 168 hours. BNP (last 3 results) Recent Labs    12/09/19 0624 02/19/20 1330 02/21/20 1151  BNP 161.9* 330.5* 538.2*    ProBNP (last 3 results) No results for input(s): PROBNP in the last 8760 hours.  Studies:  DG Chest 2 View  Result Date: 02/21/2020 CLINICAL DATA:  CHF, history coronary artery disease, TAVR, GERD, type II diabetes mellitus EXAM: CHEST - 2 VIEW COMPARISON:  02/19/2020 FINDINGS: Enlargement of cardiac silhouette post TAVR. Mediastinal contours and pulmonary vascularity normal. Atherosclerotic calcification aorta. LEFT upper greater than lower lobe infiltrate favor pneumonia over asymmetric edema. RIGHT lung clear. No pleural effusion or pneumothorax. Bones demineralized. IMPRESSION: Enlargement of cardiac silhouette post TAVR. Diffuse LEFT lung infiltrates favor pneumonia. Electronically Signed   By: Lavonia Dana M.D.   On: 02/21/2020 11:34       [facility] If 7PM-7AM, please contact night-coverage at [email or web address], Office  [phone]   02/21/2020, 3:06 PM  LOS: 1 day

## 2020-02-22 LAB — CBC
HCT: 37.8 % (ref 36.0–46.0)
Hemoglobin: 11.6 g/dL — ABNORMAL LOW (ref 12.0–15.0)
MCH: 24.2 pg — ABNORMAL LOW (ref 26.0–34.0)
MCHC: 30.7 g/dL (ref 30.0–36.0)
MCV: 78.8 fL — ABNORMAL LOW (ref 80.0–100.0)
Platelets: 296 10*3/uL (ref 150–400)
RBC: 4.8 MIL/uL (ref 3.87–5.11)
RDW: 15.7 % — ABNORMAL HIGH (ref 11.5–15.5)
WBC: 11.4 10*3/uL — ABNORMAL HIGH (ref 4.0–10.5)
nRBC: 0 % (ref 0.0–0.2)

## 2020-02-22 LAB — BASIC METABOLIC PANEL
Anion gap: 13 (ref 5–15)
BUN: 47 mg/dL — ABNORMAL HIGH (ref 8–23)
CO2: 26 mmol/L (ref 22–32)
Calcium: 8.5 mg/dL — ABNORMAL LOW (ref 8.9–10.3)
Chloride: 98 mmol/L (ref 98–111)
Creatinine, Ser: 2.84 mg/dL — ABNORMAL HIGH (ref 0.44–1.00)
GFR calc Af Amer: 19 mL/min — ABNORMAL LOW (ref >60)
GFR calc non Af Amer: 16 mL/min — ABNORMAL LOW (ref >60)
Glucose, Bld: 370 mg/dL — ABNORMAL HIGH (ref 70–99)
Potassium: 3.7 mmol/L (ref 3.5–5.1)
Sodium: 137 mmol/L (ref 135–145)

## 2020-02-22 LAB — GLUCOSE, CAPILLARY
Glucose-Capillary: 402 mg/dL — ABNORMAL HIGH (ref 70–99)
Glucose-Capillary: 330 mg/dL — ABNORMAL HIGH (ref 70–99)
Glucose-Capillary: 343 mg/dL — ABNORMAL HIGH (ref 70–99)
Glucose-Capillary: 503 mg/dL (ref 70–99)
Glucose-Capillary: 512 mg/dL (ref 70–99)
Glucose-Capillary: 314 mg/dL — ABNORMAL HIGH (ref 70–99)

## 2020-02-22 LAB — GLUCOSE, RANDOM: Glucose, Bld: 553 mg/dL (ref 70–99)

## 2020-02-22 LAB — MAGNESIUM: Magnesium: 2 mg/dL (ref 1.7–2.4)

## 2020-02-22 MED ORDER — TAFAMIDIS 61 MG PO CAPS
61.0000 mg | ORAL_CAPSULE | Freq: Every day | ORAL | Status: DC
  Administered 2020-02-22 – 2020-02-27 (×6): 61 mg via ORAL
  Filled 2020-02-22 (×5): qty 1

## 2020-02-22 MED ORDER — INSULIN ASPART 100 UNIT/ML ~~LOC~~ SOLN
0.0000 [IU] | Freq: Every day | SUBCUTANEOUS | Status: DC
  Administered 2020-02-22: 4 [IU] via SUBCUTANEOUS
  Administered 2020-02-23: 5 [IU] via SUBCUTANEOUS
  Administered 2020-02-24 – 2020-02-25 (×2): 3 [IU] via SUBCUTANEOUS
  Administered 2020-02-26: 2 [IU] via SUBCUTANEOUS

## 2020-02-22 MED ORDER — SODIUM CHLORIDE 0.9 % IV SOLN: INTRAVENOUS | Status: DC | PRN

## 2020-02-22 MED ORDER — INSULIN NPH (HUMAN) (ISOPHANE) 100 UNIT/ML ~~LOC~~ SUSP
30.0000 [IU] | Freq: Two times a day (BID) | SUBCUTANEOUS | Status: DC
  Administered 2020-02-22 – 2020-02-27 (×9): 30 [IU] via SUBCUTANEOUS

## 2020-02-22 MED ORDER — INSULIN ASPART 100 UNIT/ML ~~LOC~~ SOLN
6.0000 [IU] | Freq: Three times a day (TID) | SUBCUTANEOUS | Status: DC
  Administered 2020-02-23 – 2020-02-27 (×11): 6 [IU] via SUBCUTANEOUS

## 2020-02-22 MED ORDER — SODIUM CHLORIDE 0.9 % IV SOLN
100.0000 mg | Freq: Two times a day (BID) | INTRAVENOUS | Status: DC
  Administered 2020-02-22 – 2020-02-26 (×9): 100 mg via INTRAVENOUS
  Filled 2020-02-22 (×9): qty 100

## 2020-02-22 MED ORDER — INSULIN ASPART 100 UNIT/ML ~~LOC~~ SOLN
0.0000 [IU] | Freq: Three times a day (TID) | SUBCUTANEOUS | Status: DC
  Administered 2020-02-22 – 2020-02-23 (×2): 20 [IU] via SUBCUTANEOUS
  Administered 2020-02-23: 7 [IU] via SUBCUTANEOUS
  Administered 2020-02-23 – 2020-02-24 (×3): 11 [IU] via SUBCUTANEOUS
  Administered 2020-02-24: 15 [IU] via SUBCUTANEOUS
  Administered 2020-02-25: 11 [IU] via SUBCUTANEOUS
  Administered 2020-02-25: 4 [IU] via SUBCUTANEOUS
  Administered 2020-02-26 (×3): 7 [IU] via SUBCUTANEOUS

## 2020-02-22 MED ORDER — INSULIN ASPART 100 UNIT/ML ~~LOC~~ SOLN: 0.0000 [IU] | Freq: Three times a day (TID) | SUBCUTANEOUS | Status: DC

## 2020-02-22 MED ORDER — INSULIN ASPART 100 UNIT/ML ~~LOC~~ SOLN
10.0000 [IU] | Freq: Once | SUBCUTANEOUS | Status: AC
  Administered 2020-02-22: 10 [IU] via SUBCUTANEOUS

## 2020-02-22 NOTE — Progress Notes (Addendum)
[facility]  PROGRESS NOTE  [name] MBT:[id number] DOB: 1951/05/28 DOA: 02/19/2020 PCP: [name], DO   Brief HPI:   69 year old female with medical history of paroxysmal atrial fibrillation, CAD, hypertension, chronic diastolic CHF, aortic stenosis s/p TAVR, morbid obesity, CKD stage III, sleep apnea not on CPAP, diabetes mellitus type 2 presented with symptomatic bradycardia. Patient was recently hospitalized for new onset A. fib and was discharged on amiodarone and Coreg. Patient was recently put on hydralazine by her cardiologist. Soon after she took her medications yesterday morning she developed generalized weakness could not support herself and collapsed on the floor. Denied loss of consciousness. Per EMS patient glucose was in 40s and heart rate in 30s and 30s. EKG showed junctional rhythm. Patient was given atropine x2 and heart rate improved to 40s. EKG showed sinus bradycardia.    Subjective   Patient seen, still requiring 11 L/min of oxygen via nasal cannula.  She is diuresing well with IV Lasix.  Chest x-ray done yesterday showed left lung infiltrate consistent with pneumonia.  She was started on ceftriaxone yesterday.   Assessment/Plan:     1. Symptomatic bradycardia-likely medication induced, Coreg and amiodarone were held. TSH and cortisol pending. Heart rate has improved. Cardiology following. Will follow the recommendations.  Amiodarone 200 mg daily has been restarted.  Coreg has been discontinued. 2. Community-acquired pneumonia-chest x-ray showed left lung infiltrate. She was started on ceftriaxone yesterday.  Will add doxycycline 100 mg IV twice daily.  We will continue to wean off oxygen as tolerated. 3. Paroxysmal atrial fibrillation-currently in normal sinus rhythm. Coreg was held due to bradycardia as above. Continue anticoagulation with Eliquis. Cardiology following.  Patient is currently on amiodarone 4. Acute on chronic  diastolic  CHF-diuresed very well with IV Lasix.  Net -3.6 L.  Continue with Lasix 60 mg IV every 12 hours. 5. UTI-patient had a normal UA, urine culture growing Klebsiella pneumoniae.  Started on IV ceftriaxone.  Will follow urine culture results. 6. History of OSA-she has not tolerated CPAP in the past. VBG showed PCO2 55.4. 7. Acute kidney injury on CKD stage IV-creatinine is improving.  Today creatinine is 2.84.  Patient's baseline creatinine is around 2.5-2.7.  Will follow renal function in a.m. 8. Diabetes mellitus type 2-sliding scale insulin with NovoLog. Blood glucose elevated this morning. She had hypoglycemia episode at home.  She was started on NPH insulin 20 units subcu twice a day yesterday.  Blood glucose still elevated.  Will increase the dose of NPH insulin to 30 units subcu twice a day. 9. Morbid obesity-s/p gastric bypass surgery.    SpO2: 96 % O2 Flow Rate (L/min): 9 L/min FiO2 (%): 80 %   COVID-19 Labs  No results for input(s): DDIMER, FERRITIN, LDH, CRP in the last 72 hours.  Lab Results  Component Value Date   SARSCOV2NAA NEGATIVE 02/19/2020   [place] NEGATIVE 01/16/2020   [place] NEGATIVE 12/09/2019   SARSCOV2NAA NOT DETECTED 06/01/2019     CBG: Recent Labs  Lab 02/21/20 0600 02/21/20 1702 02/21/20 2058 02/21/20 2059 02/22/20 0609  GLUCAP 227* 402* 445* 419* 330*    CBC: Recent Labs  Lab 02/19/20 1330 02/19/20 2143  WBC 7.8  --   NEUTROABS 5.4  --   HGB 9.5* 10.9*  HCT 32.5* 32.0*  MCV 81.3  --   PLT 195  --     Basic Metabolic Panel: Recent Labs  Lab 02/19/20 1330 02/19/20 2143 02/20/20 0333 02/21/20 0322 02/22/20 0259  NA  138 140 142 144 137  K 5.0 4.9 4.4 4.2 3.7  CL 106  --  107 110 98  CO2 20*  --  24 25 26   GLUCOSE 473*  --  229* 249* 370*  BUN 62*  --  57* 46* 47*  CREATININE 3.29*  --  3.28* 2.98* 2.84*  CALCIUM 8.0*  --  8.6* 8.8* 8.5*  MG  --   --  2.2  --  2.0     DVT prophylaxis: Apixaban  Code Status: Full  code  Family Communication: No family at bedside    Status is: Inpatient  Dispo: The patient is from: Home              Anticipated d/c is to: Home              Anticipated d/c date is: 02/24/2020              Patient currently not medically stable for discharge  Barrier to discharge-ongoing treatment for UTI, pneumonia, CHF.  Pressure Injury 04/16/19 Heel Right Stage II -  Partial thickness loss of dermis presenting as a shallow open ulcer with a red, pink wound bed without slough. 3 X 2.5 cm (Active)  04/16/19 0230  Location: Heel  Location Orientation: Right  Staging: Stage II -  Partial thickness loss of dermis presenting as a shallow open ulcer with a red, pink wound bed without slough.  Wound Description (Comments): 3 X 2.5 cm  Present on Admission: Yes     Pressure Injury 04/16/19 Toe (Comment  which one) Left;Distal Stage II -  Partial thickness loss of dermis presenting as a shallow open ulcer with a red, pink wound bed without slough. .4 X .4 cm 2nd toe (Active)  04/16/19 0230  Location: Toe (Comment  which one)  Location Orientation: Left;Distal  Staging: Stage II -  Partial thickness loss of dermis presenting as a shallow open ulcer with a red, pink wound bed without slough.  Wound Description (Comments): .4 X .4 cm 2nd toe  Present on Admission: Yes     Pressure Injury 04/16/19 Toe (Comment  which one) Left Stage II -  Partial thickness loss of dermis presenting as a shallow open ulcer with a red, pink wound bed without slough. 1X1 3rd toe (Active)  04/16/19 0230  Location: Toe (Comment  which one)  Location Orientation: Left  Staging: Stage II -  Partial thickness loss of dermis presenting as a shallow open ulcer with a red, pink wound bed without slough.  Wound Description (Comments): 1X1 3rd toe  Present on Admission: Yes       Scheduled medications:  . amiodarone  200 mg Oral Daily  . apixaban  5 mg Oral BID  . calcium carbonate  1,250 mg Oral Q  breakfast  . fenofibrate  54 mg Oral Daily  . furosemide  60 mg Intravenous BID  . hydrALAZINE  25 mg Oral Q8H  . insulin aspart  0-15 Units Subcutaneous TID WC  . insulin NPH Human  20 Units Subcutaneous BID WC  . ipratropium  0.5 mg Nebulization TID  . levalbuterol  1.25 mg Nebulization TID  . omega-3 acid ethyl esters  2 g Oral BID  . Tafamidis  61 mg Oral Daily  . vitamin B-12  100 mcg Oral Daily    Consultants:  Cardiology  Procedures:  None  Antibiotics:   Anti-infectives (From admission, onward)   Start     Dose/Rate Route Frequency Ordered  Stop   02/22/20 1130  doxycycline (VIBRAMYCIN) 100 mg in sodium chloride 0.9 % 250 mL IVPB     Discontinue     100 mg 125 mL/hr over 120 Minutes Intravenous Every 12 hours 02/22/20 1030     02/21/20 1200  cefTRIAXone (ROCEPHIN) 1 g in sodium chloride 0.9 % 100 mL IVPB     Discontinue     1 g 200 mL/hr over 30 Minutes Intravenous Every 24 hours 02/21/20 1050         Objective   Vitals:   02/22/20 0500 02/22/20 0818 02/22/20 0947 02/22/20 1032  BP:   (!) 136/95   Pulse: 87 92 96   Resp: (!) 23 18 17    Temp:   98.4 F (36.9 C)   TempSrc:      SpO2: 91% 93% 93% 96%  Weight: 90.1 kg     Height:        Intake/Output Summary (Last 24 hours) at 02/22/2020 1103 Last data filed at 02/22/2020 0300 Gross per 24 hour  Intake 100 ml  Output 3625 ml  Net -3525 ml    07/08 1901 - 07/10 0700 In: 100  Out: 3625 [Urine:3625]  Filed Weights   02/19/20 1356 02/20/20 0756 02/22/20 0500  Weight: 95.3 kg 93 kg 90.1 kg    Physical Examination:  General-appears in no acute distress Heart-S1-S2, regular, no murmur auscultated Lungs-decreased breath sounds bilaterally at lung bases. Abdomen-soft, nontender, no organomegaly Extremities-trace edema in the lower extremities Neuro-alert, oriented x3, no focal deficit noted   Data Reviewed:   Recent Results (from the past 240 hour(s))  SARS Coronavirus 2 by RT PCR (hospital  order, performed in [facility] hospital lab) Nasopharyngeal Nasopharyngeal Swab     Status: None   Collection Time: 02/19/20  1:19 PM   Specimen: Nasopharyngeal Swab  Result Value Ref Range Status   SARS Coronavirus 2 NEGATIVE NEGATIVE Final    Comment: (NOTE) SARS-CoV-2 target nucleic acids are NOT DETECTED.  The SARS-CoV-2 RNA is generally detectable in upper and lower respiratory specimens during the acute phase of infection. The lowest concentration of SARS-CoV-2 viral copies this assay can detect is 250 copies / mL. A negative result does not preclude SARS-CoV-2 infection and should not be used as the sole basis for treatment or other patient management decisions.  A negative result may occur with improper specimen collection / handling, submission of specimen other than nasopharyngeal swab, presence of viral mutation(s) within the areas targeted by this assay, and inadequate number of viral copies (<250 copies / mL). A negative result must be combined with clinical observations, patient history, and epidemiological information.  Fact Sheet for Patients:   StrictlyIdeas.no  Fact Sheet for Healthcare Providers: BankingDealers.co.za  This test is not yet approved or  cleared by the [place] FDA and has been authorized for detection and/or diagnosis of SARS-CoV-2 by FDA under an Emergency Use Authorization (EUA).  This EUA will remain in effect (meaning this test can be used) for the duration of the COVID-19 declaration under Section 564(b)(1) of the Act, 21 U.S.C. section 360bbb-3(b)(1), unless the authorization is terminated or revoked sooner.  Performed at [facility], [place] [address], [place], [place] [address]   Culture, Urine     Status: Abnormal (Preliminary result)   Collection Time: 02/21/20 10:50 AM   Specimen: Urine, Random  Result Value Ref Range Status   Specimen Description URINE, RANDOM  Final    Special Requests NONE  Final  Culture (A)  Final    >=100,000 COLONIES/mL KLEBSIELLA PNEUMONIAE SUSCEPTIBILITIES TO FOLLOW Performed at [facility], [place] [address], [place], [place] [address]    Report Status PENDING  Incomplete    No results for input(s): LIPASE, AMYLASE in the last 168 hours. No results for input(s): AMMONIA in the last 168 hours.  Cardiac Enzymes: No results for input(s): CKTOTAL, CKMB, CKMBINDEX, TROPONINI in the last 168 hours. BNP (last 3 results) Recent Labs    12/09/19 0624 02/19/20 1330 02/21/20 1151  BNP 161.9* 330.5* 538.2*     Studies:  DG Chest 2 View  Result Date: 02/21/2020 CLINICAL DATA:  CHF, history coronary artery disease, TAVR, GERD, type II diabetes mellitus EXAM: CHEST - 2 VIEW COMPARISON:  02/19/2020 FINDINGS: Enlargement of cardiac silhouette post TAVR. Mediastinal contours and pulmonary vascularity normal. Atherosclerotic calcification aorta. LEFT upper greater than lower lobe infiltrate favor pneumonia over asymmetric edema. RIGHT lung clear. No pleural effusion or pneumothorax. Bones demineralized. IMPRESSION: Enlargement of cardiac silhouette post TAVR. Diffuse LEFT lung infiltrates favor pneumonia. Electronically Signed   By: Lavonia Dana M.D.   On: 02/21/2020 11:34       [facility] If 7PM-7AM, please contact night-coverage at [email or web address], Office  [phone]   02/22/2020, 11:03 AM  LOS: 2 days

## 2020-02-22 NOTE — Plan of Care (Signed)
Poc progressing.  

## 2020-02-22 NOTE — Progress Notes (Addendum)
Patient ID: [name], female   DOB: 1950/12/06, 69 y.o.   MRN: [id number]     Advanced Heart Failure Rounding Note  PCP-Cardiologist: Minus Breeding, MD   Subjective:    Patient diuresed well yesterday, weight down.  Still on 9L HFNC.  Has a cough.     HR stable 80s, NSR.    CXR with LLL PNA, now on ceftriaxone/doxycycline.   UTI with Klebsiella.    Objective:   Weight Range: 90.1 kg Body mass index is 31.11 kg/m.   Vital Signs:   Temp:  [97.9 F (36.6 C)-98.4 F (36.9 C)] 98.4 F (36.9 C) (07/10 0947) Pulse Rate:  [77-96] 96 (07/10 0947) Resp:  [17-24] 17 (07/10 0947) BP: (136-181)/(56-98) 136/95 (07/10 0947) SpO2:  [91 %-96 %] 96 % (07/10 1032) Weight:  [90.1 kg] 90.1 kg (07/10 0500) Last BM Date: 02/19/20  Weight change: Filed Weights   02/19/20 1356 02/20/20 0756 02/22/20 0500  Weight: 95.3 kg 93 kg 90.1 kg    Intake/Output:   Intake/Output Summary (Last 24 hours) at 02/22/2020 1115 Last data filed at 02/22/2020 0300 Gross per 24 hour  Intake 100 ml  Output 3625 ml  Net -3525 ml      Physical Exam    General: NAD Neck: Thick, JVP difficult, no thyromegaly or thyroid nodule.  Lungs: Decreased BS/crackles left base.  CV: Nondisplaced PMI.  Heart regular S1/S2, no S3/S4, no murmur.  No peripheral edema.   Abdomen: Soft, nontender, no hepatosplenomegaly, no distention.  Skin: Intact without lesions or rashes.  Neurologic: Alert and oriented x 3.  Psych: Normal affect. Extremities: No clubbing or cyanosis.  HEENT: Normal.    Telemetry   NSR 80s, personally reviewed.   Labs    CBC Recent Labs    02/19/20 1330 02/19/20 2143  WBC 7.8  --   NEUTROABS 5.4  --   HGB 9.5* 10.9*  HCT 32.5* 32.0*  MCV 81.3  --   PLT 195  --    Basic Metabolic Panel Recent Labs    02/20/20 0333 02/20/20 0333 02/21/20 0322 02/22/20 0259  NA 142   < > 144 137  K 4.4   < > 4.2 3.7  CL 107   < > 110 98  CO2 24   < > 25 26  GLUCOSE 229*   < > 249*  370*  BUN 57*   < > 46* 47*  CREATININE 3.28*   < > 2.98* 2.84*  CALCIUM 8.6*   < > 8.8* 8.5*  MG 2.2  --   --  2.0   < > = values in this interval not displayed.   Liver Function Tests No results for input(s): AST, ALT, ALKPHOS, BILITOT, PROT, ALBUMIN in the last 72 hours. No results for input(s): LIPASE, AMYLASE in the last 72 hours. Cardiac Enzymes No results for input(s): CKTOTAL, CKMB, CKMBINDEX, TROPONINI in the last 72 hours.  BNP: BNP (last 3 results) Recent Labs    12/09/19 0624 02/19/20 1330 02/21/20 1151  BNP 161.9* 330.5* 538.2*    ProBNP (last 3 results) No results for input(s): PROBNP in the last 8760 hours.   D-Dimer No results for input(s): DDIMER in the last 72 hours. Hemoglobin A1C No results for input(s): HGBA1C in the last 72 hours. Fasting Lipid Panel No results for input(s): CHOL, HDL, LDLCALC, TRIG, CHOLHDL, LDLDIRECT in the last 72 hours. Thyroid Function Tests No results for input(s): TSH, T4TOTAL, T3FREE, THYROIDAB in the last 72 hours.  Invalid input(s): FREET3  Other results:   Imaging    No results found.   Medications:     Scheduled Medications: . amiodarone  200 mg Oral Daily  . apixaban  5 mg Oral BID  . calcium carbonate  1,250 mg Oral Q breakfast  . fenofibrate  54 mg Oral Daily  . furosemide  60 mg Intravenous BID  . hydrALAZINE  25 mg Oral Q8H  . insulin aspart  0-15 Units Subcutaneous TID WC  . insulin NPH Human  20 Units Subcutaneous BID WC  . ipratropium  0.5 mg Nebulization TID  . levalbuterol  1.25 mg Nebulization TID  . omega-3 acid ethyl esters  2 g Oral BID  . Tafamidis  61 mg Oral Daily  . vitamin B-12  100 mcg Oral Daily    Infusions: . sodium chloride    . cefTRIAXone (ROCEPHIN)  IV Stopped (02/21/20 1351)  . doxycycline (VIBRAMYCIN) IV      PRN Medications: sodium chloride, acetaminophen, guaiFENesin, hydrALAZINE, levalbuterol, meclizine, ondansetron    Assessment/Plan   1. Tachy-brady  syndrome: Admitted with junctional bradycardia on amiodarone + Coreg.  Now off Coreg and on amiodarone at low dose, 200 mg daily.  She is in NSR in 21s.  - May end up needing PPM but continue to follow for now.  - Continue amiodarone 200 mg daily.  - Would keep her off Coreg.  2. AKI on CKD stage 4: Creatinine down to 2.84 today which is not far off her baseline.   - Avoid nephrotoxic agents.  3. CAD: No chest pain.  - No ASA given apixaban use.  - Continue pitivastatin and Praluent at home.    - Continue fenofibrate and vascepa.  4. HTN: BP elevated.   - Increase hydralazine to 37.5 mg tid.  5. PAD: S/p PTCA to right popliteal and right AT in 7/20.  Now with left distal SFA-TP trunk bypass and left TMA in 2/21.   - She has followup with vascular surgery at [facility]. 6. 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  - She is on tafamidis, continue   7. 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  8. 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   9. UTI: Klebsiella UTI, on ceftriaxone.   10. Acute on chronic diastolic CHF:  Echo in 4/27 showed EF 60-65%, moderate LVH, RV normal, s/p TAVR with stable bioprosthetic aortic valve. Oxygen saturation running low (not on oxygen at home).  This is complicated by AKI on CKD 4.  She diuresed well yesterday, creatinine down to 2.84. Volume status difficult given body habitus but would like to diurese IV for at least 1 more day.  - Lasix 60 mg IV bid today.  11. OSA: She has baseline OSA but has been unable to tolerate CPAP.  12. Acute hypoxemic respiratory failure: Suspect combination of CHF and possible LLL PNA.  Still on HFNC.  - Lasix 60 mg IV bid as above.  - Covering for PNA with ceftriaxone/doxycycline.   Length of Stay: 2  [name], MD  02/22/2020,  11:15 AM  Advanced Heart Failure Team Pager [phone] (M-F; 7a - 4p)  Please contact [facility] for night-coverage after hours (4p -7a ) and weekends on [email or web address]

## 2020-02-22 NOTE — Progress Notes (Signed)
CBG 503, stat blood glucose ordered for lab draw. DR. Darrick Meigs made aware and orders received. Will monitor patient. Teresea Donley, Bettina Gavia RN

## 2020-02-23 DIAGNOSIS — I4891 Unspecified atrial fibrillation: Secondary | ICD-10-CM

## 2020-02-23 LAB — BASIC METABOLIC PANEL
Anion gap: 11 (ref 5–15)
BUN: 56 mg/dL — ABNORMAL HIGH (ref 8–23)
CO2: 30 mmol/L (ref 22–32)
Calcium: 8.4 mg/dL — ABNORMAL LOW (ref 8.9–10.3)
Chloride: 98 mmol/L (ref 98–111)
Creatinine, Ser: 2.68 mg/dL — ABNORMAL HIGH (ref 0.44–1.00)
GFR calc Af Amer: 20 mL/min — ABNORMAL LOW (ref >60)
GFR calc non Af Amer: 17 mL/min — ABNORMAL LOW (ref >60)
Glucose, Bld: 211 mg/dL — ABNORMAL HIGH (ref 70–99)
Potassium: 3.2 mmol/L — ABNORMAL LOW (ref 3.5–5.1)
Sodium: 139 mmol/L (ref 135–145)

## 2020-02-23 LAB — URINE CULTURE: Culture: >=100000 — AB

## 2020-02-23 LAB — GLUCOSE, CAPILLARY
Glucose-Capillary: 218 mg/dL — ABNORMAL HIGH (ref 70–99)
Glucose-Capillary: 282 mg/dL — ABNORMAL HIGH (ref 70–99)
Glucose-Capillary: 445 mg/dL — ABNORMAL HIGH (ref 70–99)
Glucose-Capillary: 353 mg/dL — ABNORMAL HIGH (ref 70–99)

## 2020-02-23 LAB — GLUCOSE, RANDOM: Glucose, Bld: 454 mg/dL — ABNORMAL HIGH (ref 70–99)

## 2020-02-23 MED ORDER — LEVALBUTEROL HCL 1.25 MG/0.5ML IN NEBU
1.2500 mg | INHALATION_SOLUTION | Freq: Two times a day (BID) | RESPIRATORY_TRACT | Status: DC
  Administered 2020-02-23: 1.25 mg via RESPIRATORY_TRACT
  Filled 2020-02-23: qty 0.5

## 2020-02-23 MED ORDER — IPRATROPIUM BROMIDE 0.02 % IN SOLN
0.5000 mg | Freq: Two times a day (BID) | RESPIRATORY_TRACT | Status: DC
  Administered 2020-02-23: 0.5 mg via RESPIRATORY_TRACT
  Filled 2020-02-23: qty 2.5

## 2020-02-23 MED ORDER — POTASSIUM CHLORIDE CRYS ER 20 MEQ PO TBCR
40.0000 meq | EXTENDED_RELEASE_TABLET | Freq: Once | ORAL | Status: AC
  Administered 2020-02-23: 40 meq via ORAL
  Filled 2020-02-23: qty 2

## 2020-02-23 MED ORDER — AMIODARONE HCL IN DEXTROSE 360-4.14 MG/200ML-% IV SOLN
60.0000 mg/h | INTRAVENOUS | Status: AC
  Administered 2020-02-23 (×2): 60 mg/h via INTRAVENOUS
  Filled 2020-02-23: qty 200

## 2020-02-23 MED ORDER — METOPROLOL TARTRATE 12.5 MG HALF TABLET
12.5000 mg | ORAL_TABLET | Freq: Two times a day (BID) | ORAL | Status: DC
  Administered 2020-02-23 – 2020-02-24 (×2): 12.5 mg via ORAL
  Filled 2020-02-23 (×3): qty 1

## 2020-02-23 MED ORDER — AMIODARONE HCL IN DEXTROSE 360-4.14 MG/200ML-% IV SOLN
30.0000 mg/h | INTRAVENOUS | Status: DC
  Administered 2020-02-23: 30 mg/h via INTRAVENOUS
  Administered 2020-02-24 – 2020-02-25 (×2): 60 mg/h via INTRAVENOUS
  Administered 2020-02-25: 30 mg/h via INTRAVENOUS
  Administered 2020-02-25 (×2): 60 mg/h via INTRAVENOUS
  Administered 2020-02-26: 30 mg/h via INTRAVENOUS
  Filled 2020-02-23 (×8): qty 200

## 2020-02-23 MED ORDER — AMIODARONE LOAD VIA INFUSION: 150.0000 mg | Freq: Once | INTRAVENOUS | Status: DC

## 2020-02-23 MED ORDER — FUROSEMIDE 10 MG/ML IJ SOLN
60.0000 mg | Freq: Once | INTRAMUSCULAR | Status: AC
  Administered 2020-02-23: 60 mg via INTRAVENOUS
  Filled 2020-02-23: qty 6

## 2020-02-23 MED ORDER — DILTIAZEM HCL-DEXTROSE 125-5 MG/125ML-% IV SOLN (PREMIX)
5.0000 mg/h | INTRAVENOUS | Status: DC
  Administered 2020-02-23: 5 mg/h via INTRAVENOUS
  Filled 2020-02-23: qty 125

## 2020-02-23 MED ORDER — AMIODARONE HCL IN DEXTROSE 360-4.14 MG/200ML-% IV SOLN
INTRAVENOUS | Status: AC
  Filled 2020-02-23: qty 200

## 2020-02-23 MED ORDER — AMIODARONE LOAD VIA INFUSION
150.0000 mg | Freq: Once | INTRAVENOUS | Status: AC
  Administered 2020-02-23: 150 mg via INTRAVENOUS
  Filled 2020-02-23: qty 83.34

## 2020-02-23 MED ORDER — AMIODARONE IV BOLUS ONLY 150 MG/100ML
150.0000 mg | Freq: Once | INTRAVENOUS | Status: AC
  Filled 2020-02-23: qty 100

## 2020-02-23 NOTE — Progress Notes (Signed)
[facility]  PROGRESS NOTE  [name] BLT:[id number] DOB: Dec 15, 1950 DOA: 02/19/2020 PCP: [name], DO   Brief HPI:   69 year old female with medical history of paroxysmal atrial fibrillation, CAD, hypertension, chronic diastolic CHF, aortic stenosis s/p TAVR, morbid obesity, CKD stage III, sleep apnea not on CPAP, diabetes mellitus type 2 presented with symptomatic bradycardia. Patient was recently hospitalized for new onset A. fib and was discharged on amiodarone and Coreg. Patient was recently put on hydralazine by her cardiologist. Soon after she took her medications yesterday morning she developed generalized weakness could not support herself and collapsed on the floor. Denied loss of consciousness. Per EMS patient glucose was in 40s and heart rate in 30s and 30s. EKG showed junctional rhythm. Patient was given atropine x2 and heart rate improved to 40s. EKG showed sinus bradycardia.    Subjective   Patient seen and examined, went into A. fib this morning.  Started on IV amiodarone per cardiology.  Diuresed well with IV Lasix.  Currently on 4 L/min of oxygen.   Assessment/Plan:     1. Symptomatic bradycardia-likely medication induced, Coreg and amiodarone were held. TSH and cortisol pending. Heart rate has improved. Cardiology following. Will follow the recommendations.  Amiodarone 200 mg daily has been restarted.  Coreg has been discontinued. 2. Community-acquired pneumonia-chest x-ray showed left lung infiltrate. She was started on ceftriaxone and doxycycline.   We will continue to wean off oxygen as tolerated. 3. Paroxysmal atrial fibrillation with RVR-patient went into A. fib this morning.  She is on anticoagulation with Eliquis.  Started on IV amiodarone infusion.  Cardiology following.   4. Acute on chronic  diastolic CHF-diuresed very well with IV Lasix.  Net -3.6 L.  Continue with Lasix 60 mg IV every 12 hours. 5. UTI-patient had a normal UA, urine culture  growing Klebsiella pneumoniae which is sensitive to ceftriaxone.  We will continue with IV ceftriaxone.  6. History of OSA-she has not tolerated CPAP in the past. VBG showed PCO2 55.4. 7. Acute kidney injury on CKD stage IV-creatinine is improving.  Today creatinine is 2.68 which is close to her baseline.  Follow BMP in a.m.   8. Diabetes mellitus type 2-sliding scale insulin with NovoLog. Blood glucose elevated this morning. She had hypoglycemia episode at home.  Continue NPH insulin 30 units subcu twice a day, 6 units 3 times daily meal coverage, resistant sliding scale with insulin.   9. Morbid obesity-s/p gastric bypass surgery.    SpO2: 96 % O2 Flow Rate (L/min): 4 L/min FiO2 (%): 80 %   COVID-19 Labs  No results for input(s): DDIMER, FERRITIN, LDH, CRP in the last 72 hours.  Lab Results  Component Value Date   SARSCOV2NAA NEGATIVE 02/19/2020   SARSCOV2NAA NEGATIVE 01/16/2020   [place] NEGATIVE 12/09/2019   SARSCOV2NAA NOT DETECTED 06/01/2019     CBG: Recent Labs  Lab 02/22/20 1628 02/22/20 1828 02/22/20 2208 02/23/20 0626 02/23/20 1113  GLUCAP 503* 512* 314* 218* 282*    CBC: Recent Labs  Lab 02/19/20 1330 02/19/20 2143 02/22/20 1116  WBC 7.8  --  11.4*  NEUTROABS 5.4  --   --   HGB 9.5* 10.9* 11.6*  HCT 32.5* 32.0* 37.8  MCV 81.3  --  78.8*  PLT 195  --  007    Basic Metabolic Panel: Recent Labs  Lab 02/19/20 1330 02/19/20 1330 02/19/20 2143 02/20/20 0333 02/21/20 0322 02/22/20 0259 02/22/20 1705 02/23/20 0322  NA 138   < > 140  142 144 137  --  139  K 5.0   < > 4.9 4.4 4.2 3.7  --  3.2*  CL 106  --   --  107 110 98  --  98  CO2 20*  --   --  24 25 26   --  30  GLUCOSE 473*   < >  --  229* 249* 370* 553* 211*  BUN 62*  --   --  57* 46* 47*  --  56*  CREATININE 3.29*  --   --  3.28* 2.98* 2.84*  --  2.68*  CALCIUM 8.0*  --   --  8.6* 8.8* 8.5*  --  8.4*  MG  --   --   --  2.2  --  2.0  --   --    < > = values in this interval not displayed.      DVT prophylaxis: Apixaban  Code Status: Full code  Family Communication: No family at bedside    Status is: Inpatient  Dispo: The patient is from: Home              Anticipated d/c is to: Home              Anticipated d/c date is: 02/24/2020              Patient currently not medically stable for discharge  Barrier to discharge-ongoing treatment for UTI, pneumonia, CHF.  Pressure Injury 04/16/19 Heel Right Stage II -  Partial thickness loss of dermis presenting as a shallow open ulcer with a red, pink wound bed without slough. 3 X 2.5 cm (Active)  04/16/19 0230  Location: Heel  Location Orientation: Right  Staging: Stage II -  Partial thickness loss of dermis presenting as a shallow open ulcer with a red, pink wound bed without slough.  Wound Description (Comments): 3 X 2.5 cm  Present on Admission: Yes     Pressure Injury 04/16/19 Toe (Comment  which one) Left;Distal Stage II -  Partial thickness loss of dermis presenting as a shallow open ulcer with a red, pink wound bed without slough. .4 X .4 cm 2nd toe (Active)  04/16/19 0230  Location: Toe (Comment  which one)  Location Orientation: Left;Distal  Staging: Stage II -  Partial thickness loss of dermis presenting as a shallow open ulcer with a red, pink wound bed without slough.  Wound Description (Comments): .4 X .4 cm 2nd toe  Present on Admission: Yes     Pressure Injury 04/16/19 Toe (Comment  which one) Left Stage II -  Partial thickness loss of dermis presenting as a shallow open ulcer with a red, pink wound bed without slough. 1X1 3rd toe (Active)  04/16/19 0230  Location: Toe (Comment  which one)  Location Orientation: Left  Staging: Stage II -  Partial thickness loss of dermis presenting as a shallow open ulcer with a red, pink wound bed without slough.  Wound Description (Comments): 1X1 3rd toe  Present on Admission: Yes       Scheduled medications:  . apixaban  5 mg Oral BID  . calcium carbonate   1,250 mg Oral Q breakfast  . fenofibrate  54 mg Oral Daily  . furosemide  60 mg Intravenous Once  . hydrALAZINE  25 mg Oral Q8H  . insulin aspart  0-20 Units Subcutaneous TID WC  . insulin aspart  0-5 Units Subcutaneous QHS  . insulin aspart  6 Units Subcutaneous TID WC  .  insulin NPH Human  30 Units Subcutaneous BID WC  . omega-3 acid ethyl esters  2 g Oral BID  . Tafamidis  61 mg Oral Daily  . vitamin B-12  100 mcg Oral Daily    Consultants:  Cardiology  Procedures:  None  Antibiotics:   Anti-infectives (From admission, onward)   Start     Dose/Rate Route Frequency Ordered Stop   02/22/20 1130  doxycycline (VIBRAMYCIN) 100 mg in sodium chloride 0.9 % 250 mL IVPB     Discontinue     100 mg 125 mL/hr over 120 Minutes Intravenous Every 12 hours 02/22/20 1030     02/21/20 1200  cefTRIAXone (ROCEPHIN) 1 g in sodium chloride 0.9 % 100 mL IVPB     Discontinue     1 g 200 mL/hr over 30 Minutes Intravenous Every 24 hours 02/21/20 1050         Objective   Vitals:   02/23/20 1112 02/23/20 1200 02/23/20 1300 02/23/20 1301  BP:  117/72 118/60   Pulse:      Resp:  (!) 30  (!) 24  Temp: 98.9 F (37.2 C)     TempSrc: Oral     SpO2:  90%  96%  Weight:      Height:        Intake/Output Summary (Last 24 hours) at 02/23/2020 1442 Last data filed at 02/23/2020 1400 Gross per 24 hour  Intake 1377.63 ml  Output 2350 ml  Net -972.37 ml    07/09 1901 - 07/11 0700 In: 810 [P.O.:360] Out: 4425 [Urine:4425]  Filed Weights   02/20/20 0756 02/22/20 0500 02/23/20 0435  Weight: 93 kg 90.1 kg 91.9 kg    Physical Examination:  General-appears in no acute distress Heart-S1-S2, regular, no murmur auscultated Lungs-clear to auscultation bilaterally, no wheezing or crackles auscultated Abdomen-soft, nontender, no organomegaly Extremities-no edema in the lower extremities Neuro-alert, oriented x3, no focal deficit noted  Data Reviewed:   Recent Results (from the past 240  hour(s))  SARS Coronavirus 2 by RT PCR (hospital order, performed in [place] hospital lab) Nasopharyngeal Nasopharyngeal Swab     Status: None   Collection Time: 02/19/20  1:19 PM   Specimen: Nasopharyngeal Swab  Result Value Ref Range Status   SARS Coronavirus 2 NEGATIVE NEGATIVE Final    Comment: (NOTE) SARS-CoV-2 target nucleic acids are NOT DETECTED.  The SARS-CoV-2 RNA is generally detectable in upper and lower respiratory specimens during the acute phase of infection. The lowest concentration of SARS-CoV-2 viral copies this assay can detect is 250 copies / mL. A negative result does not preclude SARS-CoV-2 infection and should not be used as the sole basis for treatment or other patient management decisions.  A negative result may occur with improper specimen collection / handling, submission of specimen other than nasopharyngeal swab, presence of viral mutation(s) within the areas targeted by this assay, and inadequate number of viral copies (<250 copies / mL). A negative result must be combined with clinical observations, patient history, and epidemiological information.  Fact Sheet for Patients:   StrictlyIdeas.no  Fact Sheet for Healthcare Providers: BankingDealers.co.za  This test is not yet approved or  cleared by the [place] FDA and has been authorized for detection and/or diagnosis of SARS-CoV-2 by FDA under an Emergency Use Authorization (EUA).  This EUA will remain in effect (meaning this test can be used) for the duration of the COVID-19 declaration under Section 564(b)(1) of the Act, 21 U.S.C. section 360bbb-3(b)(1), unless the authorization  is terminated or revoked sooner.  Performed at [facility], [place] [address], [place], [place] [address]   Culture, Urine     Status: Abnormal   Collection Time: 02/21/20 10:50 AM   Specimen: Urine, Random  Result Value Ref Range Status   Specimen Description  URINE, RANDOM  Final   Special Requests   Final    NONE Performed at [facility], [place] [address], [place], [place] [address]    Culture >=100,000 COLONIES/mL KLEBSIELLA PNEUMONIAE (A)  Final   Report Status 02/23/2020 FINAL  Final   Organism ID, Bacteria KLEBSIELLA PNEUMONIAE (A)  Final      Susceptibility   Klebsiella pneumoniae - MIC*    AMPICILLIN >=32 RESISTANT Resistant     CEFAZOLIN <=4 SENSITIVE Sensitive     CEFTRIAXONE <=0.25 SENSITIVE Sensitive     CIPROFLOXACIN <=0.25 SENSITIVE Sensitive     GENTAMICIN <=1 SENSITIVE Sensitive     IMIPENEM <=0.25 SENSITIVE Sensitive     NITROFURANTOIN 128 RESISTANT Resistant     TRIMETH/SULFA <=20 SENSITIVE Sensitive     AMPICILLIN/SULBACTAM 16 INTERMEDIATE Intermediate     PIP/TAZO <=4 SENSITIVE Sensitive     * >=100,000 COLONIES/mL KLEBSIELLA PNEUMONIAE    BNP (last 3 results) Recent Labs    12/09/19 0624 02/19/20 1330 02/21/20 1151  BNP 161.9* 330.5* 538.2*     Studies:  No results found.     Oswald Hillock   [facility] If 7PM-7AM, please contact night-coverage at [email or web address], Office  [phone]   02/23/2020, 2:42 PM  LOS: 3 days

## 2020-02-23 NOTE — Progress Notes (Signed)
Dr. Darrick Meigs in room to see patient and orders written. Arjen Deringer, Bettina Gavia Rn

## 2020-02-23 NOTE — Progress Notes (Signed)
Patient CBG 445, STAT glucose by lab draw ordered.  Dr. Darrick Meigs paged through [facility] system, no new orders at this time. Will monitor patient. Soley Harriss, Bettina Gavia RN

## 2020-02-23 NOTE — Progress Notes (Signed)
Patient ID: [name], female   DOB: 1950/11/20, 69 y.o.   MRN: [id number]     Advanced Heart Failure Rounding Note  PCP-Cardiologist: Minus Breeding, MD   Subjective:    Patient diuresed well yesterday again.  Now on 4L [place].    She went into atrial fibrillation with RVR in 130s currently this morning.   CXR with LLL PNA, now on ceftriaxone/doxycycline.   UTI with Klebsiella.    Objective:   Weight Range: 91.9 kg Body mass index is 31.73 kg/m.   Vital Signs:   Temp:  [97.6 F (36.4 C)-98.9 F (37.2 C)] 98.9 F (37.2 C) (07/11 1112) Pulse Rate:  [75-90] 90 (07/11 0833) Resp:  [15-30] 24 (07/11 1100) BP: (104-157)/(47-75) 108/73 (07/11 1100) SpO2:  [91 %-98 %] 93 % (07/11 1100) FiO2 (%):  [80 %] 80 % (07/11 0522) Weight:  [91.9 kg] 91.9 kg (07/11 0435) Last BM Date: 02/19/20  Weight change: Filed Weights   02/20/20 0756 02/22/20 0500 02/23/20 0435  Weight: 93 kg 90.1 kg 91.9 kg    Intake/Output:   Intake/Output Summary (Last 24 hours) at 02/23/2020 1152 Last data filed at 02/23/2020 0900 Gross per 24 hour  Intake 830 ml  Output 2850 ml  Net -2020 ml      Physical Exam    General: NAD Neck: Thick, JVP 10 cm, no thyromegaly or thyroid nodule.  Lungs: Clear to auscultation bilaterally with normal respiratory effort. CV: Nondisplaced PMI.  Heart tachy/irregular S1/S2, no S3/S4, no murmur.  No peripheral edema.   Abdomen: Soft, nontender, no hepatosplenomegaly, no distention.  Skin: Intact without lesions or rashes.  Neurologic: Alert and oriented x 3.  Psych: Normal affect. Extremities: No clubbing or cyanosis.  HEENT: Normal.    Telemetry   Atrial fibrillation rate 130s, personally reviewed.   Labs    CBC Recent Labs    02/22/20 1116  WBC 11.4*  HGB 11.6*  HCT 37.8  MCV 78.8*  PLT 096   Basic Metabolic Panel Recent Labs    02/22/20 0259 02/22/20 0259 02/22/20 1705 02/23/20 0322  NA 137  --   --  139  K 3.7  --   --  3.2*  CL 98   --   --  98  CO2 26  --   --  30  GLUCOSE 370*   < > 553* 211*  BUN 47*  --   --  56*  CREATININE 2.84*  --   --  2.68*  CALCIUM 8.5*  --   --  8.4*  MG 2.0  --   --   --    < > = values in this interval not displayed.   Liver Function Tests No results for input(s): AST, ALT, ALKPHOS, BILITOT, PROT, ALBUMIN in the last 72 hours. No results for input(s): LIPASE, AMYLASE in the last 72 hours. Cardiac Enzymes No results for input(s): CKTOTAL, CKMB, CKMBINDEX, TROPONINI in the last 72 hours.  BNP: BNP (last 3 results) Recent Labs    12/09/19 0624 02/19/20 1330 02/21/20 1151  BNP 161.9* 330.5* 538.2*    ProBNP (last 3 results) No results for input(s): PROBNP in the last 8760 hours.   D-Dimer No results for input(s): DDIMER in the last 72 hours. Hemoglobin A1C No results for input(s): HGBA1C in the last 72 hours. Fasting Lipid Panel No results for input(s): CHOL, HDL, LDLCALC, TRIG, CHOLHDL, LDLDIRECT in the last 72 hours. Thyroid Function Tests No results for input(s): TSH, T4TOTAL, T3FREE, THYROIDAB  in the last 72 hours.  Invalid input(s): FREET3  Other results:   Imaging    No results found.   Medications:     Scheduled Medications: . amiodarone  150 mg Intravenous Once  . apixaban  5 mg Oral BID  . calcium carbonate  1,250 mg Oral Q breakfast  . fenofibrate  54 mg Oral Daily  . hydrALAZINE  25 mg Oral Q8H  . insulin aspart  0-20 Units Subcutaneous TID WC  . insulin aspart  0-5 Units Subcutaneous QHS  . insulin aspart  6 Units Subcutaneous TID WC  . insulin NPH Human  30 Units Subcutaneous BID WC  . omega-3 acid ethyl esters  2 g Oral BID  . Tafamidis  61 mg Oral Daily  . vitamin B-12  100 mcg Oral Daily    Infusions: . sodium chloride    . amiodarone     Followed by  . amiodarone    . amiodarone    . cefTRIAXone (ROCEPHIN)  IV 1 g (02/23/20 1115)  . doxycycline (VIBRAMYCIN) IV 100 mg (02/22/20 2237)    PRN Medications: sodium chloride,  acetaminophen, guaiFENesin, hydrALAZINE, levalbuterol, meclizine, ondansetron    Assessment/Plan   1. Tachy-brady syndrome: Admitted with junctional bradycardia on amiodarone + Coreg.  Now off Coreg and on amiodarone at low dose, 200 mg daily. She went into rapid atrial fibrillation this morning.  - May end up needing PPM but not yet.   - Amiodarone bolus then infusion, try to convert back to NSR.  - Continue apixaban, can cardiovert if needed.  2. AKI on CKD stage 4: Creatinine down to 2.68 today which is near baseline, BUN is a bit higher.    3. CAD: No chest pain.  - No ASA given apixaban use.  - Continue pitivastatin and Praluent at home.    - Continue fenofibrate and vascepa.  4. HTN: BP elevated.   - Continue hydralazine.   5. PAD: S/p PTCA to right popliteal and right AT in 7/20.  Now with left distal SFA-TP trunk bypass and left TMA in 2/21.   - She has followup with [facility]. 6. 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  - She is on tafamidis, continue   7. 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  8. 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   9. UTI: Klebsiella UTI, on ceftriaxone.   10. Acute on chronic diastolic CHF:  Echo in 2/44 showed EF 60-65%, moderate LVH, RV normal, s/p TAVR with stable bioprosthetic aortic valve. Oxygen saturation running low (not on oxygen at home).  This is complicated by AKI on CKD 4.  She diuresed well yesterday, creatinine down to 2.68. Volume status difficult given body habitus but would like to diurese IV for at least 1 more day, need to follow renal indices carefully as BUN starting to creep up.  - Lasix 60 mg IV bid today, reassess tomorrow.  11. OSA: She has baseline OSA but has been unable to tolerate CPAP.  12. Acute  hypoxemic respiratory failure: Suspect combination of CHF and possible LLL PNA.  Now on 4L [place].  - Lasix 60 mg IV bid as above.  - Covering for PNA with ceftriaxone/doxycycline.   Length of Stay: 3  [name], MD  02/23/2020, 11:52 AM  Advanced Heart Failure Team Pager [phone] (M-F; 7a -  4p)  Please contact [facility] for night-coverage after hours (4p -7a ) and weekends on [email or web address]

## 2020-02-23 NOTE — Progress Notes (Signed)
Pt has PRN BiPAP orders, pt looks comfortable at this time.  RT will continue to monitor

## 2020-02-23 NOTE — Progress Notes (Signed)
Cardiology made aware of patient heart rate in the 140s-150s Atrial Fib on monitor, Dr. Aundra Dubin on floor and in to see patient. Orders received. Will monitor patient. Margerite Impastato, Bettina Gavia RN

## 2020-02-23 NOTE — Progress Notes (Signed)
Pharmacist Heart Failure Core Measure Documentation  Assessment: [name] has an EF documented as 60-65% on 12/10/19 by Dr. Kirk Ruths.  Rationale: Heart failure patients with left ventricular systolic dysfunction (LVSD) and an EF < 40% should be prescribed an angiotensin converting enzyme inhibitor (ACEI) or angiotensin receptor blocker (ARB) at discharge unless a contraindication is documented in the medical record.  This patient is not currently on an ACEI or ARB for HF.  This note is being placed in the record in order to provide documentation that a contraindication to the use of these agents is present for this encounter.  ACE Inhibitor or Angiotensin Receptor Blocker is contraindicated (specify all that apply)  []   ACEI allergy AND ARB allergy []   Angioedema []   Moderate or severe aortic stenosis []   Hyperkalemia []   Hypotension []   Renal artery stenosis [x]   Worsening renal function, preexisting renal disease or dysfunction  Norina Buzzard, PharmD PGY1 Pharmacy Resident Phone: [phone] 02/23/2020 1:11 PM

## 2020-02-23 NOTE — Progress Notes (Signed)
Patient in rapid atrial fib this am on monitor rate 140s-150s, Dr. Darrick Meigs made aware through [facility] system. Pt vital signs obtained. bp 121/75 patient currently on 4L [place] sating 94%. EKG obtained will monitor patient. Myson Levi, Bettina Gavia RN

## 2020-02-23 NOTE — Discharge Instructions (Signed)

## 2020-02-23 NOTE — Progress Notes (Signed)
   Called by nursing because patient is still in atrial fibrillation with RVR.  Heart rate sustaining in the 130s, which is down from the 150s since being started on amiodarone bolus and infusion.  She came in with bradycardia and all of her AV nodal blocking agents were held.  Blood pressure soft around 104/67.  We will continue IV amiodarone and try Lopressor 12.5 mg twice daily.  She may require a pacemaker for tachybradycardia syndrome.  If we cannot get her heart rate controlled due to hypotension, can consider digoxin.  Angelena Form PA-C  [facility]

## 2020-02-23 NOTE — Progress Notes (Signed)
[name] [facility] with cardiology made aware patient heart rate continues to be in Atrial fib rate 130s, bp 104/67. Orders received. Will monitor patient. Abbygail [place], Bettina Gavia RN

## 2020-02-24 DIAGNOSIS — I5021 Acute systolic (congestive) heart failure: Secondary | ICD-10-CM

## 2020-02-24 LAB — BASIC METABOLIC PANEL
Anion gap: 13 (ref 5–15)
BUN: 65 mg/dL — ABNORMAL HIGH (ref 8–23)
CO2: 26 mmol/L (ref 22–32)
Calcium: 8.6 mg/dL — ABNORMAL LOW (ref 8.9–10.3)
Chloride: 99 mmol/L (ref 98–111)
Creatinine, Ser: 2.83 mg/dL — ABNORMAL HIGH (ref 0.44–1.00)
GFR calc Af Amer: 19 mL/min — ABNORMAL LOW (ref >60)
GFR calc non Af Amer: 16 mL/min — ABNORMAL LOW (ref >60)
Glucose, Bld: 258 mg/dL — ABNORMAL HIGH (ref 70–99)
Potassium: 4.2 mmol/L (ref 3.5–5.1)
Sodium: 138 mmol/L (ref 135–145)

## 2020-02-24 LAB — GLUCOSE, CAPILLARY
Glucose-Capillary: 257 mg/dL — ABNORMAL HIGH (ref 70–99)
Glucose-Capillary: 340 mg/dL — ABNORMAL HIGH (ref 70–99)
Glucose-Capillary: 270 mg/dL — ABNORMAL HIGH (ref 70–99)
Glucose-Capillary: 256 mg/dL — ABNORMAL HIGH (ref 70–99)

## 2020-02-24 MED ORDER — POLYETHYLENE GLYCOL 3350 17 G PO PACK
17.0000 g | PACK | Freq: Once | ORAL | Status: AC
  Administered 2020-02-24: 17 g via ORAL
  Filled 2020-02-24: qty 1

## 2020-02-24 MED ORDER — SODIUM CHLORIDE 0.9 % IV SOLN: INTRAVENOUS | Status: DC

## 2020-02-24 MED ORDER — FUROSEMIDE 10 MG/ML IJ SOLN
60.0000 mg | Freq: Once | INTRAMUSCULAR | Status: AC
  Administered 2020-02-24: 60 mg via INTRAVENOUS
  Filled 2020-02-24: qty 6

## 2020-02-24 MED ORDER — SENNOSIDES-DOCUSATE SODIUM 8.6-50 MG PO TABS
2.0000 | ORAL_TABLET | Freq: Two times a day (BID) | ORAL | Status: AC
  Administered 2020-02-24 (×2): 2 via ORAL
  Filled 2020-02-24 (×2): qty 2

## 2020-02-24 MED ORDER — AMIODARONE LOAD VIA INFUSION
150.0000 mg | Freq: Once | INTRAVENOUS | Status: AC
  Administered 2020-02-24: 150 mg via INTRAVENOUS
  Filled 2020-02-24: qty 83.34

## 2020-02-24 NOTE — Consult Note (Signed)
   [facility] CM Inpatient Consult   02/24/2020  [name] [birth year] [id number]   [facility] [ACO] Patient: Medicare NextGen   Patient screened for extreme high risk score for unplanned readmission score and for 3 hospitalizations in the past 6 months.   Review of patient's medical record reveals patient is in an [facility] practice.  Primary Care Provider is  Ronnie Doss, DO at [facility] [[facility]]  this provider is listed to provide the transition of care [TOC] for post hospital follow up.   Plan:  Follow up with inpatient [facility] team for disposition and if patient has care management needs will notify the [facility] team at [facility].  Continue to follow progress and disposition to assess for post hospital care management needs.    Please place a [facility] Care Management consult as appropriate and for questions contact:   [name], RN BSN [facility] Liaison  [phone] business mobile phone Toll free office [phone]  Fax number: [phone] [email or web address]

## 2020-02-24 NOTE — Progress Notes (Addendum)
Patient ID: [name], female   DOB: May 01, 1951, 69 y.o.   MRN: [id number]     Advanced Heart Failure Rounding Note  PCP-Cardiologist: Minus Breeding, MD   Subjective:    Diuresis slowed yesterday, only 1.7 L in UOP (2.5 L the day prior).   SCr and BUN trending up.   SCr 2.68>>2.84 (3.3 on admit)  BUN 56>>65   Wt also trending up, 204 lb today (reports baseline dry wt ~198 lb)  Remains in atrial fibrillation with RVR in 130s currently this morning.   CXR with LLL PNA, now on ceftriaxone/doxycycline.   UTI with Klebsiella.   Afebrile.   Main complaint currently is constipation. No BM in 3 days.    Objective:   Weight Range: 92.7 kg Body mass index is 32.01 kg/m.   Vital Signs:   Temp:  [97.8 F (36.6 C)-99.6 F (37.6 C)] 97.8 F (36.6 C) (07/12 0801) Pulse Rate:  [119-139] 132 (07/12 0801) Resp:  [19-30] 22 (07/12 0801) BP: (101-138)/(60-86) 101/65 (07/12 0801) SpO2:  [90 %-98 %] 97 % (07/12 0801) Weight:  [92.7 kg] 92.7 kg (07/12 0634) Last BM Date: 02/19/20  Weight change: Filed Weights   02/22/20 0500 02/23/20 0435 02/24/20 0634  Weight: 90.1 kg 91.9 kg 92.7 kg    Intake/Output:   Intake/Output Summary (Last 24 hours) at 02/24/2020 0928 Last data filed at 02/24/2020 0400 Gross per 24 hour  Intake 1477.33 ml  Output 600 ml  Net 877.33 ml      Physical Exam    General: super morbidly obese WF, no distress  Neck: Thick neck, JVP elevated to ear, no thyromegaly or thyroid nodule.  Lungs: Clear to auscultation bilaterally with normal respiratory effort. CV: Nondisplaced PMI.  Irregularly irregular rhythm, tachy rate no murmur.  No peripheral edema.    Abdomen: obese, Soft, nontender, no hepatosplenomegaly, no distention.  Skin: Intact without lesions or rashes.  Neurologic: Alert and oriented x 3.  Psych: Normal affect. Extremities: No clubbing or cyanosis. S/p left transmetatarsal amputation  HEENT: Normal.     Telemetry   Atrial  fibrillation rate 130s, brief runs of NSVT, personally reviewed.   Labs    CBC Recent Labs    02/22/20 1116  WBC 11.4*  HGB 11.6*  HCT 37.8  MCV 78.8*  PLT 902   Basic Metabolic Panel Recent Labs    02/22/20 0259 02/22/20 1705 02/23/20 0322 02/23/20 0322 02/23/20 1655 02/24/20 0216  NA 137  --  139  --   --  138  K 3.7  --  3.2*  --   --  4.2  CL 98  --  98  --   --  99  CO2 26  --  30  --   --  26  GLUCOSE 370*   < > 211*   < > 454* 258*  BUN 47*  --  56*  --   --  65*  CREATININE 2.84*  --  2.68*  --   --  2.83*  CALCIUM 8.5*  --  8.4*  --   --  8.6*  MG 2.0  --   --   --   --   --    < > = values in this interval not displayed.   Liver Function Tests No results for input(s): AST, ALT, ALKPHOS, BILITOT, PROT, ALBUMIN in the last 72 hours. No results for input(s): LIPASE, AMYLASE in the last 72 hours. Cardiac Enzymes No results for input(s): CKTOTAL, CKMB,  CKMBINDEX, TROPONINI in the last 72 hours.  BNP: BNP (last 3 results) Recent Labs    12/09/19 0624 02/19/20 1330 02/21/20 1151  BNP 161.9* 330.5* 538.2*    ProBNP (last 3 results) No results for input(s): PROBNP in the last 8760 hours.   D-Dimer No results for input(s): DDIMER in the last 72 hours. Hemoglobin A1C No results for input(s): HGBA1C in the last 72 hours. Fasting Lipid Panel No results for input(s): CHOL, HDL, LDLCALC, TRIG, CHOLHDL, LDLDIRECT in the last 72 hours. Thyroid Function Tests No results for input(s): TSH, T4TOTAL, T3FREE, THYROIDAB in the last 72 hours.  Invalid input(s): FREET3  Other results:   Imaging    No results found.   Medications:     Scheduled Medications: . apixaban  5 mg Oral BID  . calcium carbonate  1,250 mg Oral Q breakfast  . fenofibrate  54 mg Oral Daily  . hydrALAZINE  25 mg Oral Q8H  . insulin aspart  0-20 Units Subcutaneous TID WC  . insulin aspart  0-5 Units Subcutaneous QHS  . insulin aspart  6 Units Subcutaneous TID WC  . insulin NPH  Human  30 Units Subcutaneous BID WC  . metoprolol tartrate  12.5 mg Oral BID  . omega-3 acid ethyl esters  2 g Oral BID  . Tafamidis  61 mg Oral Daily  . vitamin B-12  100 mcg Oral Daily    Infusions: . sodium chloride    . amiodarone 30 mg/hr (02/23/20 2208)  . cefTRIAXone (ROCEPHIN)  IV Stopped (02/23/20 1250)  . 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  Now off Coreg. No further brachycardia on tele but remains in rapid atrial fibrillation in the 130s-140s. - Continue amiodarone gtt 30 mg/hr. Will rebolus 150 mg x 1.  - May end up needing PPM but not yet.   - Continue apixaban, can cardiovert if needed.  2. AKI on CKD stage 4: Creatinine 2.83 today (as high as 3.3 this admit) - continue IV lasix today. Give 60 mg now.  - follow BMP  - repeat BNP in am.  3. CAD: No chest pain.  - No ASA given apixaban use.  - Continue pitivastatin and Praluent at home.    - Continue fenofibrate and vascepa.  4. HTN: BP controlled on current regimen.    - Continue hydralazine.   5. PAD: S/p PTCA to right popliteal and right AT in 7/20.  Now with left distal SFA-TP trunk bypass and left TMA in 2/21.   - She has followup with [facility] surgery at [facility]. 6. 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  - She is on tafamidis, continue   7. 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  8. 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   9. UTI: Klebsiella UTI, on ceftriaxone.   10. Acute on chronic diastolic CHF:  Echo in 4/74 showed EF 60-65%, moderate LVH, RV normal, s/p TAVR with  stable bioprosthetic aortic valve. Oxygen saturation running low (not on oxygen at home).  This is complicated by AKI on CKD 4.  - remains volume overloaded w/ elevated JVD and wt trending up (6 lb above dry wt) - Continue Lasix 60  mg IV bid today, reassess tomorrow. Follow BMP closely.  - Multiple admits for a/c CHF in last 6 months. Consider cardiomems implant.  11. OSA: She has baseline OSA but has been unable to tolerate CPAP.  12. Acute hypoxemic respiratory failure: Suspect combination of CHF and possible LLL PNA.  Now on 4L [place].  - Lasix 60 mg IV bid as above.  - Covering for PNA with ceftriaxone/doxycycline.  13. Constipation  - trial of miralax + senokot   Length of Stay: [address] PA-C  02/24/2020, 9:28 AM  Advanced Heart Failure Team Pager [phone] (M-F; 7a - 4p)  Please contact [facility] for night-coverage after hours (4p -7a ) and weekends on [email or web address]  Patient seen with PA, agree with the above note.    She is still in atrial fibrillation with RVR in 120s on amiodarone gtt.  BUN/creatinine rising.   General: NAD Neck: Thick, JVP difficult but not markedly elevated, no thyromegaly or thyroid nodule.  Lungs: Clear to auscultation bilaterally with normal respiratory effort. CV: Nondisplaced PMI.  Heart tachy, irregular S1/S2, no S3/S4, no murmur.  No peripheral edema.  Abdomen: Soft, nontender, no hepatosplenomegaly, no distention.  Skin: Intact without lesions or rashes.  Neurologic: Alert and oriented x 3.  Psych: Normal affect. Extremities: No clubbing or cyanosis. S/p left TMA.  HEENT: Normal.   Still in atrial fibrillation with RVR.  - Would hold low dose metoprolol and increase amiodarone gtt to 60 mg/hr.  - Plan for TEE-guided DCCV tomorrow if still in atrial fibrillation (thinks she may have missed some doses of apixaban in the last month).  - Will need to follow after DCCV for bradycardia given recent history.   Probably mild volume  overload, but with rising BUN/creatinine, would hold further IV Lasix until after cardioversion.   On ceftriaxone/doxycycline for PNA and UTI.   [name] 02/24/2020 4:05 PM

## 2020-02-24 NOTE — Plan of Care (Signed)
Poc progressing.  

## 2020-02-24 NOTE — Progress Notes (Signed)
Inpatient Diabetes Program Recommendations  AACE/ADA: New Consensus Statement on Inpatient Glycemic Control (2015)  Target Ranges:  Prepandial:   less than 140 mg/dL      Peak postprandial:   less than 180 mg/dL (1-2 hours)      Critically ill patients:  140 - 180 mg/dL   Lab Results  Component Value Date   GLUCAP 340 (H) 02/24/2020   HGBA1C 10.2 (H) 01/16/2020    Review of Glycemic Control Results for Alison, [name] (MRN [id number]) as of 02/24/2020 12:15  Ref. Range 02/23/2020 06:26 02/23/2020 11:13 02/23/2020 16:06 02/23/2020 21:38 02/24/2020 06:33 02/24/2020 11:44  Glucose-Capillary Latest Ref Range: 70 - 99 mg/dL 218 (H) 282 (H) 445 (H) 353 (H) 257 (H) 340 (H)   Diabetes history: DM 2 Outpatient Diabetes medications: NPH 60 units breakfast- 60 units lunch- 20 units supper, Novolin Regular insulin 20-30 units tid meal coverage Current orders for Inpatient glycemic control: NPH 30 units bid + Novolog correction 6 tid meal coverage +Novolog 0-20 units tid  Inpatient Diabetes Program Recommendations:   -Increase NPH dose of insulin to 60 units ac breakfast -Increase Novolog meal coverage to to 8 units tid if eats 50%  Thank you, Bethena Roys E. [name], RN, MSN, CDE  Diabetes Coordinator Inpatient Glycemic Control Team Team Pager [phone] (8am-5pm) 02/24/2020 12:21 PM

## 2020-02-24 NOTE — H&P (View-Only) (Signed)
Patient ID: [name], female   DOB: 1951-04-01, 69 y.o.   MRN: [id number]     Advanced Heart Failure Rounding Note  PCP-Cardiologist: Minus Breeding, MD   Subjective:    Diuresis slowed yesterday, only 1.7 L in UOP (2.5 L the day prior).   SCr and BUN trending up.   SCr 2.68>>2.84 (3.3 on admit)  BUN 56>>65   Wt also trending up, 204 lb today (reports baseline dry wt ~198 lb)  Remains in atrial fibrillation with RVR in 130s currently this morning.   CXR with LLL PNA, now on ceftriaxone/doxycycline.   UTI with Klebsiella.   Afebrile.   Main complaint currently is constipation. No BM in 3 days.    Objective:   Weight Range: 92.7 kg Body mass index is 32.01 kg/m.   Vital Signs:   Temp:  [97.8 F (36.6 C)-99.6 F (37.6 C)] 97.8 F (36.6 C) (07/12 0801) Pulse Rate:  [119-139] 132 (07/12 0801) Resp:  [19-30] 22 (07/12 0801) BP: (101-138)/(60-86) 101/65 (07/12 0801) SpO2:  [90 %-98 %] 97 % (07/12 0801) Weight:  [92.7 kg] 92.7 kg (07/12 0634) Last BM Date: 02/19/20  Weight change: Filed Weights   02/22/20 0500 02/23/20 0435 02/24/20 0634  Weight: 90.1 kg 91.9 kg 92.7 kg    Intake/Output:   Intake/Output Summary (Last 24 hours) at 02/24/2020 0928 Last data filed at 02/24/2020 0400 Gross per 24 hour  Intake 1477.33 ml  Output 600 ml  Net 877.33 ml      Physical Exam    General: super morbidly obese WF, no distress  Neck: Thick neck, JVP elevated to ear, no thyromegaly or thyroid nodule.  Lungs: Clear to auscultation bilaterally with normal respiratory effort. CV: Nondisplaced PMI.  Irregularly irregular rhythm, tachy rate no murmur.  No peripheral edema.    Abdomen: obese, Soft, nontender, no hepatosplenomegaly, no distention.  Skin: Intact without lesions or rashes.  Neurologic: Alert and oriented x 3.  Psych: Normal affect. Extremities: No clubbing or cyanosis. S/p left transmetatarsal amputation  HEENT: Normal.     Telemetry   Atrial  fibrillation rate 130s, brief runs of NSVT, personally reviewed.   Labs    CBC Recent Labs    02/22/20 1116  WBC 11.4*  HGB 11.6*  HCT 37.8  MCV 78.8*  PLT 397   Basic Metabolic Panel Recent Labs    02/22/20 0259 02/22/20 1705 02/23/20 0322 02/23/20 0322 02/23/20 1655 02/24/20 0216  NA 137  --  139  --   --  138  K 3.7  --  3.2*  --   --  4.2  CL 98  --  98  --   --  99  CO2 26  --  30  --   --  26  GLUCOSE 370*   < > 211*   < > 454* 258*  BUN 47*  --  56*  --   --  65*  CREATININE 2.84*  --  2.68*  --   --  2.83*  CALCIUM 8.5*  --  8.4*  --   --  8.6*  MG 2.0  --   --   --   --   --    < > = values in this interval not displayed.   Liver Function Tests No results for input(s): AST, ALT, ALKPHOS, BILITOT, PROT, ALBUMIN in the last 72 hours. No results for input(s): LIPASE, AMYLASE in the last 72 hours. Cardiac Enzymes No results for input(s): CKTOTAL, CKMB,  CKMBINDEX, TROPONINI in the last 72 hours.  BNP: BNP (last 3 results) Recent Labs    12/09/19 0624 02/19/20 1330 02/21/20 1151  BNP 161.9* 330.5* 538.2*    ProBNP (last 3 results) No results for input(s): PROBNP in the last 8760 hours.   D-Dimer No results for input(s): DDIMER in the last 72 hours. Hemoglobin A1C No results for input(s): HGBA1C in the last 72 hours. Fasting Lipid Panel No results for input(s): CHOL, HDL, LDLCALC, TRIG, CHOLHDL, LDLDIRECT in the last 72 hours. Thyroid Function Tests No results for input(s): TSH, T4TOTAL, T3FREE, THYROIDAB in the last 72 hours.  Invalid input(s): FREET3  Other results:   Imaging    No results found.   Medications:     Scheduled Medications: . apixaban  5 mg Oral BID  . calcium carbonate  1,250 mg Oral Q breakfast  . fenofibrate  54 mg Oral Daily  . hydrALAZINE  25 mg Oral Q8H  . insulin aspart  0-20 Units Subcutaneous TID WC  . insulin aspart  0-5 Units Subcutaneous QHS  . insulin aspart  6 Units Subcutaneous TID WC  . insulin NPH  Human  30 Units Subcutaneous BID WC  . metoprolol tartrate  12.5 mg Oral BID  . omega-3 acid ethyl esters  2 g Oral BID  . Tafamidis  61 mg Oral Daily  . vitamin B-12  100 mcg Oral Daily    Infusions: . sodium chloride    . amiodarone 30 mg/hr (02/23/20 2208)  . cefTRIAXone (ROCEPHIN)  IV Stopped (02/23/20 1250)  . 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  Now off Coreg. No further brachycardia on tele but remains in rapid atrial fibrillation in the 130s-140s. - Continue amiodarone gtt 30 mg/hr. Will rebolus 150 mg x 1.  - May end up needing PPM but not yet.   - Continue apixaban, can cardiovert if needed.  2. AKI on CKD stage 4: Creatinine 2.83 today (as high as 3.3 this admit) - continue IV lasix today. Give 60 mg now.  - follow BMP  - repeat BNP in am.  3. CAD: No chest pain.  - No ASA given apixaban use.  - Continue pitivastatin and Praluent at home.    - Continue fenofibrate and vascepa.  4. HTN: BP controlled on current regimen.    - Continue hydralazine.   5. PAD: S/p PTCA to right popliteal and right AT in 7/20.  Now with left distal SFA-TP trunk bypass and left TMA in 2/21.   - She has followup with [facility] at [facility]. 6. 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  - She is on tafamidis, continue   7. 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  8. 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   9. UTI: Klebsiella UTI, on ceftriaxone.   10. Acute on chronic diastolic CHF:  Echo in 6/44 showed EF 60-65%, moderate LVH, RV normal, s/p TAVR with  stable bioprosthetic aortic valve. Oxygen saturation running low (not on oxygen at home).  This is complicated by AKI on CKD 4.  - remains volume overloaded w/ elevated JVD and wt trending up (6 lb above dry wt) - Continue Lasix 60  mg IV bid today, reassess tomorrow. Follow BMP closely.  - Multiple admits for a/c CHF in last 6 months. Consider cardiomems implant.  11. OSA: She has baseline OSA but has been unable to tolerate CPAP.  12. Acute hypoxemic respiratory failure: Suspect combination of CHF and possible LLL PNA.  Now on 4L [place].  - Lasix 60 mg IV bid as above.  - Covering for PNA with ceftriaxone/doxycycline.  13. Constipation  - trial of miralax + senokot   Length of Stay: [address] PA-C  02/24/2020, 9:28 AM  Advanced Heart Failure Team Pager [phone] (M-F; 7a - 4p)  Please contact [facility] for night-coverage after hours (4p -7a ) and weekends on [email or web address]  Patient seen with PA, agree with the above note.    She is still in atrial fibrillation with RVR in 120s on amiodarone gtt.  BUN/creatinine rising.   General: NAD Neck: Thick, JVP difficult but not markedly elevated, no thyromegaly or thyroid nodule.  Lungs: Clear to auscultation bilaterally with normal respiratory effort. CV: Nondisplaced PMI.  Heart tachy, irregular S1/S2, no S3/S4, no murmur.  No peripheral edema.  Abdomen: Soft, nontender, no hepatosplenomegaly, no distention.  Skin: Intact without lesions or rashes.  Neurologic: Alert and oriented x 3.  Psych: Normal affect. Extremities: No clubbing or cyanosis. S/p left TMA.  HEENT: Normal.   Still in atrial fibrillation with RVR.  - Would hold low dose metoprolol and increase amiodarone gtt to 60 mg/hr.  - Plan for TEE-guided DCCV tomorrow if still in atrial fibrillation (thinks she may have missed some doses of apixaban in the last month).  - Will need to follow after DCCV for bradycardia given recent history.   Probably mild volume  overload, but with rising BUN/creatinine, would hold further IV Lasix until after cardioversion.   On ceftriaxone/doxycycline for PNA and UTI.   [name] 02/24/2020 4:05 PM

## 2020-02-24 NOTE — Progress Notes (Addendum)
Progress Note  Patient Name: EOWYN TABONE Date of Encounter: 02/24/2020  [facility] Cardiologist: Minus Breeding, MD  EP: Dr. Rayann Heman  Subjective   No rest SOB, intermittent cough.  Minimal cardiac awareness on occasion can feel it thumping, no CP  Inpatient Medications    Scheduled Meds: . apixaban  5 mg Oral BID  . calcium carbonate  1,250 mg Oral Q breakfast  . fenofibrate  54 mg Oral Daily  . hydrALAZINE  25 mg Oral Q8H  . insulin aspart  0-20 Units Subcutaneous TID WC  . insulin aspart  0-5 Units Subcutaneous QHS  . insulin aspart  6 Units Subcutaneous TID WC  . insulin NPH Human  30 Units Subcutaneous BID WC  . metoprolol tartrate  12.5 mg Oral BID  . omega-3 acid ethyl esters  2 g Oral BID  . senna-docusate  2 tablet Oral BID  . Tafamidis  61 mg Oral Daily  . vitamin B-12  100 mcg Oral Daily   Continuous Infusions: . sodium chloride    . amiodarone 30 mg/hr (02/23/20 2208)  . cefTRIAXone (ROCEPHIN)  IV Stopped (02/23/20 1250)  . doxycycline (VIBRAMYCIN) IV 100 mg (02/23/20 2216)   PRN Meds: sodium chloride, acetaminophen, guaiFENesin, hydrALAZINE, levalbuterol, meclizine, ondansetron   Vital Signs    Vitals:   02/24/20 0323 02/24/20 0634 02/24/20 0641 02/24/20 0801  BP: 122/64   101/65  Pulse: (!) 124   (!) 132  Resp: 19 (!) 28 20 (!) 22  Temp: 98.3 F (36.8 C)   97.8 F (36.6 C)  TempSrc: Oral   Oral  SpO2: 98% 98% 98% 97%  Weight:  92.7 kg    Height:        Intake/Output Summary (Last 24 hours) at 02/24/2020 1139 Last data filed at 02/24/2020 1027 Gross per 24 hour  Intake 1477.33 ml  Output 1300 ml  Net 177.33 ml   Last 3 Weights 02/24/2020 02/23/2020 02/22/2020  Weight (lbs) 204 lb 5.9 oz 202 lb 9.6 oz 198 lb 10.2 oz  Weight (kg) 92.7 kg 91.9 kg 90.1 kg      Telemetry    AFib 120's currently - Personally Reviewed  ECG    No new EKgs - Personally Reviewed  Physical Exam   GEN: No acute distress.   Neck: No JVD Cardiac:  irreg-irreg, tachycarduc, no murmurs, rubs, or gallops.  Respiratory: CTA b/l. GI: Soft, nontender, obese  MS: No edema; L trans metatarsal amputation Neuro:  Nonfocal  Psych: Normal affect   Labs    High Sensitivity Troponin:   Recent Labs  Lab 02/19/20 1330 02/19/20 1522  TROPONINIHS 22* 21*      Chemistry Recent Labs  Lab 02/22/20 0259 02/22/20 1705 02/23/20 0322 02/23/20 1655 02/24/20 0216  NA 137  --  139  --  138  K 3.7  --  3.2*  --  4.2  CL 98  --  98  --  99  CO2 26  --  30  --  26  GLUCOSE 370*   < > 211* 454* 258*  BUN 47*  --  56*  --  65*  CREATININE 2.84*  --  2.68*  --  2.83*  CALCIUM 8.5*  --  8.4*  --  8.6*  GFRNONAA 16*  --  17*  --  16*  GFRAA 19*  --  20*  --  19*  ANIONGAP 13  --  11  --  13   < > = values  in this interval not displayed.     Hematology Recent Labs  Lab 02/19/20 1330 02/19/20 2143 02/22/20 1116  WBC 7.8  --  11.4*  RBC 4.00  --  4.80  HGB 9.5* 10.9* 11.6*  HCT 32.5* 32.0* 37.8  MCV 81.3  --  78.8*  MCH 23.8*  --  24.2*  MCHC 29.2*  --  30.7  RDW 15.4  --  15.7*  PLT 195  --  296    BNP Recent Labs  Lab 02/19/20 1330 02/21/20 1151  BNP 330.5* 538.2*     DDimer No results for input(s): DDIMER in the last 168 hours.   Radiology    No results found.  Cardiac Studies   12/10/2019 TTE IMPRESSIONS  1. Normal LV systolic function; moderate LVH; grade 2 diastolic  dsyfunction with elevated LV filling pressure; s/p AVR with normal mean  gradient of 16 mmHg and no AI; AVA 1.6 cm2; MAC with mild MS (mean  gradient 5 mmHg and MVA 2 cm2).  2. Left ventricular ejection fraction, by estimation, is 60 to 65%. The  left ventricle has normal function. The left ventricle has no regional  wall motion abnormalities. There is moderate left ventricular hypertrophy.  Left ventricular diastolic  parameters are consistent with Grade II diastolic dysfunction  (pseudonormalization). Elevated left atrial pressure.  3. Right  ventricular systolic function is normal. The right ventricular  size is normal.  4. The mitral valve is abnormal. Trivial mitral valve regurgitation. Mild  mitral stenosis.  5. The aortic valve has been repaired/replaced. Aortic valve  regurgitation is not visualized. No aortic stenosis is present. There is a  Transport planner prosthetic (TAVR) valve present in the aortic position.  6. The inferior vena cava is dilated in size with >50% respiratory  variability, suggesting right atrial pressure of 8 mmHg.   Patient Profile     69 y.o. 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  Pt recently admitted 6/3 - 6/4 with severe palpitations and substernal chest pain up to 8/10. Presented in AF with RVR in the 140s. HS troponin 9090 and thought to be 2/2 adib with RVR in the setting of cath 12/2017 which showed moderate CAD and 99% stenosis of small caliber vessel of the LAD too small for PCI and desire to avoid PCI in setting of CKD III.  Pt was admitted and started on amiodarone with conversion to NSR.   Seen by Dr. Rayann Heman virtually 01/20/2020 and reported doing well. Planned close follow up in AF clinic to monitor amiodarone therapy (felt to be a poor ablation candidate).   Pt had weight gain 6/29 and called HF clinic, torsemide was temporarily increased but then decreased back down with AKI on CKD IV (Cr up to 3.44)  Pt presented to [facility] today with generalized weakness times 1 week. PT had blood sugar in the 40s this am and EMS called. On their arrival reported glucose of 490 and noted to be lethargic. Also noted to be bradycardic into the 30s so brought into ED. EKG on exam showed junctional rhythm at 34 bpm. She was given atropine x 2 with initially no response, but HR gradually improved to mid  40 and lower 50s.   Pertinent labs on admission include Hgb 9.5, Glucose 473, Creatinine 3.29  which is near baseline, HS troponin 21, Lactic acid 3.1. COVID negative. CXR without infiltrates or edema.    Echocardiogram 11/2019 60-65%  7/7/EP was consulted (Dr. Lovena Le), also felt to be a poor pacer candidate coreg held and amio reduced in hopes of avoiding pacing 02/20/2020 her HR had improvement, Dr. Rayann Heman mentioned consideration of resuming her coreg at 6.25 if her HR improves.  Looks like she maintained SR 60's-7o's until 711/21 and has been in AFib w/RVR since then. EP is asked to weigh back in   Home meds were  Amiodarone 450m BID (started June 2021) and coreg 258mBID  Assessment & Plan    1.Junctional bradycardia -> Sinus bradycardia      She was hyperglycemic w/bS of 490  2. Persistent atrial fibrillation Currently in sinus bradycardia.  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 Chronic diastolic CHF  Echo 4/26/2836th EF 60-65%, Grade 2 DD    4. 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 PYP study abnormal, myeloma panel negative She is managed on tafamidis As per AHF team   5. AS s/p TAVR 2019 Stable by Echo 11/2019  6. CKD IV Baseline creatinineappears around3recently Today 2.83       For questions or  updates, please contact [facility] consult [email or web address] for contact info under        Signed, RenBaldwin JamaicaA-C  02/24/2020, 11:39 AM   As above  Agree with  DR DM not yet clear that she will need pacing as some of her afib and the RVR may be 2/2 acute illness   Would, like he, have a low threshold for DCCV  [place]

## 2020-02-24 NOTE — Progress Notes (Signed)
[facility]  PROGRESS NOTE  [name] MWN:[id number] DOB: [birth year] DOA: 02/19/2020 PCP: [name], DO   Brief HPI:   69 year old female with medical history of paroxysmal atrial fibrillation, CAD, hypertension, chronic diastolic CHF, aortic stenosis s/p TAVR, morbid obesity, CKD stage III, sleep apnea not on CPAP, diabetes mellitus type 2 presented with symptomatic bradycardia. Patient was recently hospitalized for new onset A. fib and was discharged on amiodarone and Coreg. Patient was recently put on hydralazine by her cardiologist. Soon after she took her medications yesterday morning she developed generalized weakness could not support herself and collapsed on the floor. Denied loss of consciousness. Per EMS patient glucose was in 40s and heart rate in 30s and 30s. EKG showed junctional rhythm. Patient was given atropine x2 and heart rate improved to 40s. EKG showed sinus bradycardia.    Subjective   Patient seen and examined, denies any complaints.  Breathing is stable.  Went into A. fib with RVR, started on amiodarone.  Still in A. fib.   Assessment/Plan:     1. Symptomatic bradycardia-likely medication induced, resolved.  Coreg and amiodarone were held. Heart rate has improved.  TSH is pending.  Cardiology following. Will follow the recommendations.  Patient is currently on amiodarone infusion.  Coreg has been stopped. 2. Community-acquired pneumonia-chest x-ray showed left lung infiltrate. She was started on ceftriaxone and doxycycline.   We will continue to wean off oxygen as tolerated. 3. Paroxysmal atrial fibrillation with RVR-patient went into A. Fib.  She is on anticoagulation with Eliquis.  Started on IV amiodarone infusion.  Still in A. fib.  EP has been consulted for DCCV tomorrow. 4. Acute on chronic  diastolic CHF-diuresed very well with IV Lasix.  Net - 4.8 L L.  Patient was started on Lasix 60 mg IV every 2 hour which is currently on hold in  anticipation for DCCV tomorrow.   5. UTI-patient had a normal UA, urine culture growing Klebsiella pneumoniae which is sensitive to ceftriaxone.  We will continue with IV ceftriaxone.  6. History of OSA-she has not tolerated CPAP in the past. VBG showed PCO2 55.4. 7. Acute kidney injury on CKD stage IV-creatinine is mildly elevated at 2.83.   Follow BMP in a.m.   8. Diabetes mellitus type 2-sliding scale insulin with NovoLog. Blood glucose elevated this morning. She had hypoglycemia episode at home.  Continue NPH insulin 30 units subcu twice a day, 6 units 3 times daily meal coverage, resistant sliding scale with insulin.   9. Morbid obesity-s/p gastric bypass surgery.    SpO2: 98 % O2 Flow Rate (L/min): 3 L/min FiO2 (%): 80 %   COVID-19 Labs  No results for input(s): DDIMER, FERRITIN, LDH, CRP in the last 72 hours.  Lab Results  Component Value Date   SARSCOV2NAA NEGATIVE 02/19/2020   SARSCOV2NAA NEGATIVE 01/16/2020   [place] NEGATIVE 12/09/2019   SARSCOV2NAA NOT DETECTED 06/01/2019     CBG: Recent Labs  Lab 02/23/20 1113 02/23/20 1606 02/23/20 2138 02/24/20 0633 02/24/20 1144  GLUCAP 282* 445* 353* 257* 340*    CBC: Recent Labs  Lab 02/19/20 1330 02/19/20 2143 02/22/20 1116  WBC 7.8  --  11.4*  NEUTROABS 5.4  --   --   HGB 9.5* 10.9* 11.6*  HCT 32.5* 32.0* 37.8  MCV 81.3  --  78.8*  PLT 195  --  403    Basic Metabolic Panel: Recent Labs  Lab 02/20/20 0333 02/20/20 0333 02/21/20 0322 02/21/20 0322 02/22/20 0259 02/22/20  1705 02/23/20 0322 02/23/20 1655 02/24/20 0216  NA 142  --  144  --  137  --  139  --  138  K 4.4  --  4.2  --  3.7  --  3.2*  --  4.2  CL 107  --  110  --  98  --  98  --  99  CO2 24  --  25  --  26  --  30  --  26  GLUCOSE 229*   < > 249*   < > 370* 553* 211* 454* 258*  BUN 57*  --  46*  --  47*  --  56*  --  65*  CREATININE 3.28*  --  2.98*  --  2.84*  --  2.68*  --  2.83*  CALCIUM 8.6*  --  8.8*  --  8.5*  --  8.4*  --  8.6*   MG 2.2  --   --   --  2.0  --   --   --   --    < > = values in this interval not displayed.     DVT prophylaxis: Apixaban  Code Status: Full code  Family Communication: No family at bedside    Status is: Inpatient  Dispo: The patient is from: Home              Anticipated d/c is to: Home              Anticipated d/c date is: 02/27/2020              Patient currently not medically stable for discharge  Barrier to discharge-ongoing treatment for UTI, pneumonia, CHF.  Pressure Injury 04/16/19 Heel Right Stage II -  Partial thickness loss of dermis presenting as a shallow open ulcer with a red, pink wound bed without slough. 3 X 2.5 cm (Active)  04/16/19 0230  Location: Heel  Location Orientation: Right  Staging: Stage II -  Partial thickness loss of dermis presenting as a shallow open ulcer with a red, pink wound bed without slough.  Wound Description (Comments): 3 X 2.5 cm  Present on Admission: Yes     Pressure Injury 04/16/19 Toe (Comment  which one) Left;Distal Stage II -  Partial thickness loss of dermis presenting as a shallow open ulcer with a red, pink wound bed without slough. .4 X .4 cm 2nd toe (Active)  04/16/19 0230  Location: Toe (Comment  which one)  Location Orientation: Left;Distal  Staging: Stage II -  Partial thickness loss of dermis presenting as a shallow open ulcer with a red, pink wound bed without slough.  Wound Description (Comments): .4 X .4 cm 2nd toe  Present on Admission: Yes     Pressure Injury 04/16/19 Toe (Comment  which one) Left Stage II -  Partial thickness loss of dermis presenting as a shallow open ulcer with a red, pink wound bed without slough. 1X1 3rd toe (Active)  04/16/19 0230  Location: Toe (Comment  which one)  Location Orientation: Left  Staging: Stage II -  Partial thickness loss of dermis presenting as a shallow open ulcer with a red, pink wound bed without slough.  Wound Description (Comments): 1X1 3rd toe  Present on  Admission: Yes       Scheduled medications:  . apixaban  5 mg Oral BID  . calcium carbonate  1,250 mg Oral Q breakfast  . fenofibrate  54 mg Oral Daily  . hydrALAZINE  25 mg Oral Q8H  . insulin aspart  0-20 Units Subcutaneous TID WC  . insulin aspart  0-5 Units Subcutaneous QHS  . insulin aspart  6 Units Subcutaneous TID WC  . insulin NPH Human  30 Units Subcutaneous BID WC  . omega-3 acid ethyl esters  2 g Oral BID  . senna-docusate  2 tablet Oral BID  . Tafamidis  61 mg Oral Daily  . vitamin B-12  100 mcg Oral Daily    Consultants:  Cardiology  Procedures:  None  Antibiotics:   Anti-infectives (From admission, onward)   Start     Dose/Rate Route Frequency Ordered Stop   02/22/20 1130  doxycycline (VIBRAMYCIN) 100 mg in sodium chloride 0.9 % 250 mL IVPB     Discontinue     100 mg 125 mL/hr over 120 Minutes Intravenous Every 12 hours 02/22/20 1030     02/21/20 1200  cefTRIAXone (ROCEPHIN) 1 g in sodium chloride 0.9 % 100 mL IVPB     Discontinue     1 g 200 mL/hr over 30 Minutes Intravenous Every 24 hours 02/21/20 1050         Objective   Vitals:   02/24/20 0634 02/24/20 0641 02/24/20 0801 02/24/20 1148  BP:   101/65 124/70  Pulse:   (!) 132 (!) 135  Resp: (!) 28 20 (!) 22 (!) 24  Temp:   97.8 F (36.6 C) 98 F (36.7 C)  TempSrc:   Oral Oral  SpO2: 98% 98% 97% 98%  Weight: 92.7 kg     Height:        Intake/Output Summary (Last 24 hours) at 02/24/2020 1613 Last data filed at 02/24/2020 1027 Gross per 24 hour  Intake 929.7 ml  Output 1300 ml  Net -370.3 ml    07/10 1901 - 07/12 0700 In: 1717.3 [P.O.:360; I.V.:507.3] Out: 1900 [Urine:1900]  Filed Weights   02/22/20 0500 02/23/20 0435 02/24/20 0634  Weight: 90.1 kg 91.9 kg 92.7 kg    Physical Examination:  General-appears in no acute distress Heart-S1-S2, regular, no murmur auscultated Lungs-clear to auscultation bilaterally, no wheezing or crackles auscultated Abdomen-soft, nontender, no  organomegaly Extremities-no edema in the lower extremities Neuro-alert, oriented x3, no focal deficit noted  Data Reviewed:   Recent Results (from the past 240 hour(s))  SARS Coronavirus 2 by RT PCR (hospital order, performed in [facility] hospital lab) Nasopharyngeal Nasopharyngeal Swab     Status: None   Collection Time: 02/19/20  1:19 PM   Specimen: Nasopharyngeal Swab  Result Value Ref Range Status   SARS Coronavirus 2 NEGATIVE NEGATIVE Final    Comment: (NOTE) SARS-CoV-2 target nucleic acids are NOT DETECTED.  The SARS-CoV-2 RNA is generally detectable in upper and lower respiratory specimens during the acute phase of infection. The lowest concentration of SARS-CoV-2 viral copies this assay can detect is 250 copies / mL. A negative result does not preclude SARS-CoV-2 infection and should not be used as the sole basis for treatment or other patient management decisions.  A negative result may occur with improper specimen collection / handling, submission of specimen other than nasopharyngeal swab, presence of viral mutation(s) within the areas targeted by this assay, and inadequate number of viral copies (<250 copies / mL). A negative result must be combined with clinical observations, patient history, and epidemiological information.  Fact Sheet for Patients:   StrictlyIdeas.no  Fact Sheet for Healthcare Providers: BankingDealers.co.za  This test is not yet approved or  cleared by the [place] FDA  and has been authorized for detection and/or diagnosis of SARS-CoV-2 by FDA under an Emergency Use Authorization (EUA).  This EUA will remain in effect (meaning this test can be used) for the duration of the COVID-19 declaration under Section 564(b)(1) of the Act, 21 U.S.C. section 360bbb-3(b)(1), unless the authorization is terminated or revoked sooner.  Performed at [facility], [place] [address], [place],  [place] [address]   Culture, Urine     Status: Abnormal   Collection Time: 02/21/20 10:50 AM   Specimen: Urine, Random  Result Value Ref Range Status   Specimen Description URINE, RANDOM  Final   Special Requests   Final    NONE Performed at [facility], [place] [address] [place], [place] [address]    Culture >=100,000 COLONIES/mL KLEBSIELLA PNEUMONIAE (A)  Final   Report Status 02/23/2020 FINAL  Final   Organism ID, Bacteria KLEBSIELLA PNEUMONIAE (A)  Final      Susceptibility   Klebsiella pneumoniae - MIC*    AMPICILLIN >=32 RESISTANT Resistant     CEFAZOLIN <=4 SENSITIVE Sensitive     CEFTRIAXONE <=0.25 SENSITIVE Sensitive     CIPROFLOXACIN <=0.25 SENSITIVE Sensitive     GENTAMICIN <=1 SENSITIVE Sensitive     IMIPENEM <=0.25 SENSITIVE Sensitive     NITROFURANTOIN 128 RESISTANT Resistant     TRIMETH/SULFA <=20 SENSITIVE Sensitive     AMPICILLIN/SULBACTAM 16 INTERMEDIATE Intermediate     PIP/TAZO <=4 SENSITIVE Sensitive     * >=100,000 COLONIES/mL KLEBSIELLA PNEUMONIAE    BNP (last 3 results) Recent Labs    12/09/19 0624 02/19/20 1330 02/21/20 1151  BNP 161.9* 330.5* 538.2*      [place]   [facility] If 7PM-7AM, please contact night-coverage at [email or web address], Office  [phone]   02/24/2020, 4:13 PM  LOS: 4 days

## 2020-02-24 NOTE — Care Management Important Message (Signed)
Important Message  Patient Details  Name: [name] MRN: [id number] Date of Birth: August 07, 1951   Medicare Important Message Given:  Yes     Shelda Altes 02/24/2020, 9:23 AM

## 2020-02-24 NOTE — Progress Notes (Signed)
Text Paged on call [facility] MD T Opyd about pt having frequent wide qrs complexes, last one was more than 13 beats. On Afib with rate 110-130's, Amio drip running. Pt asymptomatic, BP 108/66. No new orders at this time.

## 2020-02-24 NOTE — Progress Notes (Signed)
Text paged on call Cardiology Dr. Juliane Lack stating pt is having frequent wide qrs complexes, latest was more than 20 beats. Also notified pt's HR has been 110-130's Afib with amio drip running. BP 122/64, pt asymptomatic. MD called back and said pt does not need anything at this time.  Pt resting in bed ao x4 without any distress, will continue to monitor.

## 2020-02-25 ENCOUNTER — Inpatient Hospital Stay (HOSPITAL_COMMUNITY): Payer: Medicare Other | Admitting: Certified Registered"

## 2020-02-25 ENCOUNTER — Inpatient Hospital Stay (HOSPITAL_COMMUNITY): Payer: Medicare Other

## 2020-02-25 ENCOUNTER — Encounter (HOSPITAL_COMMUNITY)
Admission: EM | Disposition: A | Discharge status: Home Health | Payer: Self-pay | Source: Home / Self Care | Attending: Family Medicine

## 2020-02-25 ENCOUNTER — Encounter (HOSPITAL_COMMUNITY): Payer: Self-pay | Admitting: Family Medicine

## 2020-02-25 DIAGNOSIS — I340 Nonrheumatic mitral (valve) insufficiency: Secondary | ICD-10-CM

## 2020-02-25 DIAGNOSIS — I4891 Unspecified atrial fibrillation: Secondary | ICD-10-CM

## 2020-02-25 HISTORY — PX: CARDIOVERSION: SHX1299

## 2020-02-25 HISTORY — PX: TEE WITHOUT CARDIOVERSION: SHX5443

## 2020-02-25 LAB — GLUCOSE, CAPILLARY
Glucose-Capillary: 124 mg/dL — ABNORMAL HIGH (ref 70–99)
Glucose-Capillary: 166 mg/dL — ABNORMAL HIGH (ref 70–99)
Glucose-Capillary: 277 mg/dL — ABNORMAL HIGH (ref 70–99)
Glucose-Capillary: 299 mg/dL — ABNORMAL HIGH (ref 70–99)

## 2020-02-25 LAB — CBC
HCT: 32.8 % — ABNORMAL LOW (ref 36.0–46.0)
Hemoglobin: 10 g/dL — ABNORMAL LOW (ref 12.0–15.0)
MCH: 23.8 pg — ABNORMAL LOW (ref 26.0–34.0)
MCHC: 30.5 g/dL (ref 30.0–36.0)
MCV: 78.1 fL — ABNORMAL LOW (ref 80.0–100.0)
Platelets: 266 10*3/uL (ref 150–400)
RBC: 4.2 MIL/uL (ref 3.87–5.11)
RDW: 15.9 % — ABNORMAL HIGH (ref 11.5–15.5)
WBC: 6.7 10*3/uL (ref 4.0–10.5)
nRBC: 0 % (ref 0.0–0.2)

## 2020-02-25 LAB — BASIC METABOLIC PANEL
Anion gap: 12 (ref 5–15)
BUN: 71 mg/dL — ABNORMAL HIGH (ref 8–23)
CO2: 27 mmol/L (ref 22–32)
Calcium: 8.9 mg/dL (ref 8.9–10.3)
Chloride: 102 mmol/L (ref 98–111)
Creatinine, Ser: 2.69 mg/dL — ABNORMAL HIGH (ref 0.44–1.00)
GFR calc Af Amer: 20 mL/min — ABNORMAL LOW (ref >60)
GFR calc non Af Amer: 17 mL/min — ABNORMAL LOW (ref >60)
Glucose, Bld: 132 mg/dL — ABNORMAL HIGH (ref 70–99)
Potassium: 3.7 mmol/L (ref 3.5–5.1)
Sodium: 141 mmol/L (ref 135–145)

## 2020-02-25 LAB — TSH: TSH: 1.435 u[IU]/mL (ref 0.350–4.500)

## 2020-02-25 LAB — MAGNESIUM: Magnesium: 2 mg/dL (ref 1.7–2.4)

## 2020-02-25 SURGERY — CARDIOVERSION
Anesthesia: Monitor Anesthesia Care

## 2020-02-25 MED ORDER — TORSEMIDE 20 MG PO TABS
20.0000 mg | ORAL_TABLET | Freq: Two times a day (BID) | ORAL | Status: DC
  Administered 2020-02-26 – 2020-02-27 (×3): 20 mg via ORAL
  Filled 2020-02-25 (×3): qty 1

## 2020-02-25 MED ORDER — BUTAMBEN-TETRACAINE-BENZOCAINE 2-2-14 % EX AERO
INHALATION_SPRAY | CUTANEOUS | Status: DC | PRN
  Administered 2020-02-25: 2 via TOPICAL

## 2020-02-25 MED ORDER — LIDOCAINE HCL (CARDIAC) PF 100 MG/5ML IV SOSY
PREFILLED_SYRINGE | INTRAVENOUS | Status: DC | PRN
  Administered 2020-02-25: 80 mg via INTRAVENOUS

## 2020-02-25 MED ORDER — PROPOFOL 500 MG/50ML IV EMUL
INTRAVENOUS | Status: DC | PRN
  Administered 2020-02-25: 75 ug/kg/min via INTRAVENOUS

## 2020-02-25 MED ORDER — PROPOFOL 10 MG/ML IV BOLUS
INTRAVENOUS | Status: DC | PRN
  Administered 2020-02-25 (×2): 30 mg via INTRAVENOUS
  Administered 2020-02-25: 20 mg via INTRAVENOUS

## 2020-02-25 MED ORDER — SORBITOL 70 % SOLN
30.0000 mL | Freq: Once | Status: AC
  Administered 2020-02-25: 30 mL via ORAL
  Filled 2020-02-25: qty 30

## 2020-02-25 NOTE — Progress Notes (Signed)
Inpatient Diabetes Program Recommendations  AACE/ADA: New Consensus Statement on Inpatient Glycemic Control (2015)  Target Ranges:  Prepandial:   less than 140 mg/dL      Peak postprandial:   less than 180 mg/dL (1-2 hours)      Critically ill patients:  140 - 180 mg/dL   Lab Results  Component Value Date   GLUCAP 124 (H) 02/25/2020   HGBA1C 10.2 (H) 01/16/2020    Review of Glycemic Control Results for [name], [name] (MRN [id number]) as of 02/25/2020 08:26  Ref. Range 02/24/2020 06:33 02/24/2020 11:44 02/24/2020 16:44 02/24/2020 21:48 02/25/2020 06:09  Glucose-Capillary Latest Ref Range: 70 - 99 mg/dL 257 (H) 340 (H) 270 (H) 256 (H) 124 (H)   Diabetes history:DM 2 Outpatient Diabetes medications: NPH 60 units breakfast- 60 units lunch- 20 units supper, Novolin Regular insulin 20-30 units tid meal coverage Current orders for Inpatient glycemic control: NPH 30 units bid + Novolog correction 6 tid meal coverage +Novolog 0-20 units tid  Inpatient Diabetes Program Recommendations:  Patient has received total of Novolog 58 units over the past 24 hrs. (  18 units meal coverage + 40 units correction)  -Increase NPH dose of insulin to 60 units ac breakfast  Thank you, Nani Gasser. Jilian West, RN, MSN, CDE  Diabetes Coordinator Inpatient Glycemic Control Team Team Pager [phone] (8am-5pm) 02/25/2020 8:27 AM

## 2020-02-25 NOTE — Procedures (Signed)
Electrical Cardioversion Procedure Note [name] [id number] 06-Oct-1950  Procedure: Electrical Cardioversion Indications:  Atrial Fibrillation  Procedure Details Consent: Risks of procedure as well as the alternatives and risks of each were explained to the (patient/caregiver).  Consent for procedure obtained. Time Out: Verified patient identification, verified procedure, site/side was marked, verified correct patient position, special equipment/implants available, medications/allergies/relevent history reviewed, required imaging and test results available.  Performed  Patient placed on cardiac monitor, pulse oximetry, supplemental oxygen as necessary.  Sedation given: Propofol per anesthesiology Pacer pads placed anterior and posterior chest.  Cardioverted 1 time(s).  Cardioverted at [place].  Evaluation Findings: Post procedure EKG shows: NSR Complications: None Patient did tolerate procedure well.   [name] 02/25/2020, 1:39 PM

## 2020-02-25 NOTE — CV Procedure (Signed)
Procedure: TEE  Indication: Atrial fibrillation  Sedation: Per anesthesiology  Findings: Please see echo section for full report.  Normal LV size with mild LV hypertrophy.  EF 55-60%.  Normal wall motion. Normal RV size and systolic function.  Moderate left atrial enlargement with no LA appendage thrombus.  Mild right atrial enlargement.  There was lipomatous atrial septal hypertrophy with no evidence for PFO/ASD by color doppler.  Mild tricuspid regurgitation with estimated PA systolic pressure 66 mmHg.  Bioprosthetic aortic valve s/p TAVR; mean gradient 11 mmHg with no significant regurgitation.  There was moderate mitral annular calcification and also a small, calcified nodule on the posterior mitral leaflet that may be an old vegetation.  There was mild-moderate MR and probably mild mitral stenosis (mean gradient 7 mmHg with short pressure half-time).  IVC normal size.  Normal caliber thoracic aorta with grade 3 plaque in the descending thoracic aorta.   May proceed to [place] 02/25/2020 1:39 PM

## 2020-02-25 NOTE — Progress Notes (Signed)
  Echocardiogram Echocardiogram Transesophageal has been performed.  [name] 02/25/2020, 1:47 PM

## 2020-02-25 NOTE — Progress Notes (Addendum)
Patient ID: [name], female   DOB: 1951-02-08, 69 y.o.   MRN: [id number]     Advanced Heart Failure Rounding Note  PCP-Cardiologist: Minus Breeding, MD   Subjective:    Remains in atrial fibrillation with RVR in 120s. On for TEE/DCCV today.   Volume status improved. Wt down to 185 lb today, below previous dry wt. SCr trending down, 2.83>>2.69 but BUN higher, 65>>71. Diuretics currently on hold.   On abx for LLL PNA and UTI with Klebsiella.   Afebrile.   Continues w/ constipation, despite miralax + senokot. No BM in 4 days.   OOB sitting in chair. Breathing improved.    Objective:   Weight Range: 84.3 kg Body mass index is 29.1 kg/m.   Vital Signs:   Temp:  [97.4 F (36.3 C)-98 F (36.7 C)] 97.7 F (36.5 C) (07/13 0314) Pulse Rate:  [84-135] 84 (07/13 0314) Resp:  [17-24] 17 (07/13 0320) BP: (111-126)/(61-98) 126/87 (07/13 0314) SpO2:  [98 %-100 %] 99 % (07/13 0320) Weight:  [84.3 kg] 84.3 kg (07/13 0320) Last BM Date: 02/24/20  Weight change: Filed Weights   02/23/20 0435 02/24/20 0634 02/25/20 0320  Weight: 91.9 kg 92.7 kg 84.3 kg    Intake/Output:   Intake/Output Summary (Last 24 hours) at 02/25/2020 0909 Last data filed at 02/25/2020 0318 Gross per 24 hour  Intake 240 ml  Output 1150 ml  Net -910 ml      Physical Exam    General: super morbidly obese WF, OOB sitting up in chair no distress  Neck: Thick neck, JVD assessment difficult, no thyromegaly or thyroid nodule.  Lungs: clear bilaterally, no wheezing  CV: Nondisplaced PMI.  Irregularly irregular rhythm, tachy rate no murmur.  No peripheral edema.    Abdomen: obese, Soft, nontender, no hepatosplenomegaly, no distention. Hypoactive BS Skin: Intact without lesions or rashes.  Neurologic: Alert and oriented x 3.  Psych: flat affect. Extremities: No clubbing or cyanosis. S/p left transmetatarsal amputation  HEENT: Normal.     Telemetry   Atrial fibrillation rate 120s personally  reviewed.   Labs    CBC Recent Labs    02/22/20 1116 02/25/20 0339  WBC 11.4* 6.7  HGB 11.6* 10.0*  HCT 37.8 32.8*  MCV 78.8* 78.1*  PLT 296 983   Basic Metabolic Panel Recent Labs    02/24/20 0216 02/25/20 0339  NA 138 141  K 4.2 3.7  CL 99 102  CO2 26 27  GLUCOSE 258* 132*  BUN 65* 71*  CREATININE 2.83* 2.69*  CALCIUM 8.6* 8.9  MG  --  2.0   Liver Function Tests No results for input(s): AST, ALT, ALKPHOS, BILITOT, PROT, ALBUMIN in the last 72 hours. No results for input(s): LIPASE, AMYLASE in the last 72 hours. Cardiac Enzymes No results for input(s): CKTOTAL, CKMB, CKMBINDEX, TROPONINI in the last 72 hours.  BNP: BNP (last 3 results) Recent Labs    12/09/19 0624 02/19/20 1330 02/21/20 1151  BNP 161.9* 330.5* 538.2*    ProBNP (last 3 results) No results for input(s): PROBNP in the last 8760 hours.   D-Dimer No results for input(s): DDIMER in the last 72 hours. Hemoglobin A1C No results for input(s): HGBA1C in the last 72 hours. Fasting Lipid Panel No results for input(s): CHOL, HDL, LDLCALC, TRIG, CHOLHDL, LDLDIRECT in the last 72 hours. Thyroid Function Tests Recent Labs    02/25/20 0339  TSH 1.435    Other results:   Imaging    No results found.  Medications:     Scheduled Medications: . apixaban  5 mg Oral BID  . calcium carbonate  1,250 mg Oral Q breakfast  . fenofibrate  54 mg Oral Daily  . hydrALAZINE  25 mg Oral Q8H  . insulin aspart  0-20 Units Subcutaneous TID WC  . insulin aspart  0-5 Units Subcutaneous QHS  . insulin aspart  6 Units Subcutaneous TID WC  . insulin NPH Human  30 Units Subcutaneous BID WC  . omega-3 acid ethyl esters  2 g Oral BID  . Tafamidis  61 mg Oral Daily  . vitamin B-12  100 mcg Oral Daily    Infusions: . sodium chloride    . sodium chloride    . amiodarone 60 mg/hr (02/25/20 0618)  . cefTRIAXone (ROCEPHIN)  IV 1 g (02/24/20 1634)  . doxycycline (VIBRAMYCIN) IV 100 mg (02/25/20 0004)     PRN Medications: sodium chloride, acetaminophen, guaiFENesin, hydrALAZINE, levalbuterol, meclizine, ondansetron    Assessment/Plan   1. Tachy-brady syndrome: Admitted with junctional bradycardia on amiodarone + Coreg.  Now off Coreg. No further brachycardia on tele but remains in rapid atrial fibrillation in the 120s. - Continue amiodarone gtt 60 mg/hr.  - May end up needing PPM but not yet.   - Continue apixaban 5 mg bid  - Plan TEE/ DCCV today  2. AKI on CKD stage 4: Creatinine improving, 2.83>>2.69 (as high as 3.3 this admit), but BUN trending up now at 71. Volume status improved.  - hold diuretics today.  - follow BMP daily  3. CAD: No chest pain.  - No ASA given apixaban use.  - Continue pitivastatin and Praluent at home.    - Continue fenofibrate and vascepa.  4. HTN: BP controlled on current regimen.    - Continue hydralazine.   5. PAD: S/p PTCA to right popliteal and right AT in 7/20.  Now with left distal SFA-TP trunk bypass and left TMA in 2/21.   - She has followup with vascular surgery at [facility]. 6. 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  - She is on tafamidis, continue   7. 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  8. 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   9. UTI: Klebsiella UTI, on ceftriaxone.   10. Acute on chronic diastolic CHF:  Echo in 1/74 showed EF 60-65%, moderate LVH, RV normal, s/p TAVR with stable bioprosthetic aortic valve. Oxygen saturation running low (not on oxygen at home).  This is complicated by AKI on CKD 4.  - Volume status improved. Wt below previous dry wt.  - Hold Lasix today for elevated SCr/BUN  - Multiple admits for a/c CHF in last 6 months. Consider cardiomems implant.  11. OSA: She has baseline OSA but has been unable to tolerate  CPAP.  12. Acute hypoxemic respiratory failure: Suspect combination of CHF and possible LLL PNA.  Now on 4L [place].  - Volume status improved. Hold diuretics per above.   - Covering for PNA with ceftriaxone/doxycycline.  13. Constipation  - failed miralax + senokot  - give sorbitol after TEE/DCCV   Length of Stay: [address], PA-C  02/25/2020, 9:09 AM  Advanced Heart Failure Team Pager [phone] (M-F; 7a - 4p)  Please contact [facility] for night-coverage after hours (4p -7a ) and weekends on [email or web address]  Patient seen with PA, agree with the  above note.   She had successful TEE-guided DCCV today, now back in NSR.  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  IVC not dilated.   I will decrease amiodarone to 30 mg/hr and continue apixaban.  Probably to po amiodarone tomorrow. Watch closely for bradycardia.   Creatinine trending down, 2.69 today.  IVC normal on TEE and she does not looks significantly volume overloaded (though exam is difficult).  Would restart torsemide 20 mg bid (home dose) tomorrow.   [name] 02/25/2020 2:03 PM

## 2020-02-25 NOTE — Progress Notes (Signed)
[facility]  PROGRESS NOTE  [name] SFK:[id number] DOB: August 11, 1951 DOA: 02/19/2020 PCP: [name], DO   Brief HPI:   70 year old female with medical history of paroxysmal atrial fibrillation, CAD, hypertension, chronic diastolic CHF, aortic stenosis s/p TAVR, morbid obesity, CKD stage III, sleep apnea not on CPAP, diabetes mellitus type 2 presented with symptomatic bradycardia. Patient was recently hospitalized for new onset A. fib and was discharged on amiodarone and Coreg. Patient was recently put on hydralazine by her cardiologist. Soon after she took her medications yesterday morning she developed generalized weakness could not support herself and collapsed on the floor. Denied loss of consciousness. Per EMS patient glucose was in 40s and heart rate in 30s and 30s. EKG showed junctional rhythm. Patient was given atropine x2 and heart rate improved to 40s. EKG showed sinus bradycardia.    Subjective   Patient seen and examined, denies any complaints.  Plan for TEE/DCCV today   Assessment/Plan:     1. Symptomatic bradycardia-likely medication induced, resolved.  Coreg and amiodarone were held. Heart rate has improved.  TSH is pending.  Cardiology following. Will follow the recommendations.  Patient is currently on amiodarone infusion.  Coreg has been stopped. 2. Community-acquired pneumonia-chest x-ray showed left lung infiltrate. She was started on ceftriaxone and doxycycline.   We will continue to wean off oxygen as tolerated. 3. Paroxysmal atrial fibrillation with RVR-patient went into A. Fib.  She is on anticoagulation with Eliquis.  Started on IV amiodarone infusion.  Still in A. fib.  EP has been consulted for TEE/DCCV today. 4. Acute on chronic  diastolic CHF-diuresed very well with IV Lasix.  Net -5.0  L.  Patient was started on Lasix 60 mg IV every 2 hour which is currently on hold in anticipation for DCCV  5. UTI-patient had a normal UA, urine culture  growing Klebsiella pneumoniae which is sensitive to ceftriaxone.  We will continue with IV ceftriaxone.  6. History of OSA-she has not tolerated CPAP in the past. VBG showed PCO2 55.4. 7. Acute kidney injury on CKD stage IV-creatinine is stable at 2.69.  Follow BMP in a.m.   8. Diabetes mellitus type 2-sliding scale insulin with NovoLog. Blood glucose is well controlled.  She had hypoglycemia episode at home.  Continue NPH insulin 30 units subcu twice a day, 6 units 3 times daily meal coverage, resistant sliding scale with insulin.   9. Morbid obesity-s/p gastric bypass surgery.    SpO2: 100 % O2 Flow Rate (L/min): 3 L/min FiO2 (%): 80 %   COVID-19 Labs  No results for input(s): DDIMER, FERRITIN, LDH, CRP in the last 72 hours.  Lab Results  Component Value Date   SARSCOV2NAA NEGATIVE 02/19/2020   SARSCOV2NAA NEGATIVE 01/16/2020   [place] NEGATIVE 12/09/2019   SARSCOV2NAA NOT DETECTED 06/01/2019     CBG: Recent Labs  Lab 02/24/20 1144 02/24/20 1644 02/24/20 2148 02/25/20 0609 02/25/20 0830  GLUCAP 340* 270* 256* 124* 166*    CBC: Recent Labs  Lab 02/19/20 1330 02/19/20 2143 02/22/20 1116 02/25/20 0339  WBC 7.8  --  11.4* 6.7  NEUTROABS 5.4  --   --   --   HGB 9.5* 10.9* 11.6* 10.0*  HCT 32.5* 32.0* 37.8 32.8*  MCV 81.3  --  78.8* 78.1*  PLT 195  --  296 174    Basic Metabolic Panel: Recent Labs  Lab 02/20/20 0333 02/20/20 0333 02/21/20 0322 02/21/20 0322 02/22/20 0259 02/22/20 0259 02/22/20 1705 02/23/20 0322 02/23/20 1655 02/24/20  0216 02/25/20 0339  NA 142   < > 144  --  137  --   --  139  --  138 141  K 4.4   < > 4.2  --  3.7  --   --  3.2*  --  4.2 3.7  CL 107   < > 110  --  98  --   --  98  --  99 102  CO2 24   < > 25  --  26  --   --  30  --  26 27  GLUCOSE 229*   < > 249*   < > 370*   < > 553* 211* 454* 258* 132*  BUN 57*   < > 46*  --  47*  --   --  56*  --  65* 71*  CREATININE 3.28*   < > 2.98*  --  2.84*  --   --  2.68*  --  2.83*  2.69*  CALCIUM 8.6*   < > 8.8*  --  8.5*  --   --  8.4*  --  8.6* 8.9  MG 2.2  --   --   --  2.0  --   --   --   --   --  2.0   < > = values in this interval not displayed.     DVT prophylaxis: Apixaban  Code Status: Full code  Family Communication: No family at bedside    Status is: Inpatient  Dispo: The patient is from: Home              Anticipated d/c is to: Home              Anticipated d/c date is: 02/27/2020              Patient currently not medically stable for discharge  Barrier to discharge-ongoing treatment for UTI, pneumonia, CHF.  Pressure Injury 04/16/19 Heel Right Stage II -  Partial thickness loss of dermis presenting as a shallow open ulcer with a red, pink wound bed without slough. 3 X 2.5 cm (Active)  04/16/19 0230  Location: Heel  Location Orientation: Right  Staging: Stage II -  Partial thickness loss of dermis presenting as a shallow open ulcer with a red, pink wound bed without slough.  Wound Description (Comments): 3 X 2.5 cm  Present on Admission: Yes     Pressure Injury 04/16/19 Toe (Comment  which one) Left;Distal Stage II -  Partial thickness loss of dermis presenting as a shallow open ulcer with a red, pink wound bed without slough. .4 X .4 cm 2nd toe (Active)  04/16/19 0230  Location: Toe (Comment  which one)  Location Orientation: Left;Distal  Staging: Stage II -  Partial thickness loss of dermis presenting as a shallow open ulcer with a red, pink wound bed without slough.  Wound Description (Comments): .4 X .4 cm 2nd toe  Present on Admission: Yes     Pressure Injury 04/16/19 Toe (Comment  which one) Left Stage II -  Partial thickness loss of dermis presenting as a shallow open ulcer with a red, pink wound bed without slough. 1X1 3rd toe (Active)  04/16/19 0230  Location: Toe (Comment  which one)  Location Orientation: Left  Staging: Stage II -  Partial thickness loss of dermis presenting as a shallow open ulcer with a red, pink wound bed  without slough.  Wound Description (Comments): 1X1 3rd toe  Present on  Admission: Yes       Scheduled medications:  . [MAR Hold] apixaban  5 mg Oral BID  . [MAR Hold] calcium carbonate  1,250 mg Oral Q breakfast  . [MAR Hold] fenofibrate  54 mg Oral Daily  . [MAR Hold] hydrALAZINE  25 mg Oral Q8H  . [MAR Hold] insulin aspart  0-20 Units Subcutaneous TID WC  . [MAR Hold] insulin aspart  0-5 Units Subcutaneous QHS  . [MAR Hold] insulin aspart  6 Units Subcutaneous TID WC  . [MAR Hold] insulin NPH Human  30 Units Subcutaneous BID WC  . [MAR Hold] omega-3 acid ethyl esters  2 g Oral BID  . [MAR Hold] sorbitol  30 mL Oral Once  . [MAR Hold] Tafamidis  61 mg Oral Daily  . [MAR Hold] vitamin B-12  100 mcg Oral Daily    Consultants:  Cardiology  Procedures:  None  Antibiotics:   Anti-infectives (From admission, onward)   Start     Dose/Rate Route Frequency Ordered Stop   02/22/20 1130  [MAR Hold]  doxycycline (VIBRAMYCIN) 100 mg in sodium chloride 0.9 % 250 mL IVPB     Discontinue     (MAR Hold since Tue 02/25/2020 at 1207.Hold Reason: Transfer to a Procedural area.)   100 mg 125 mL/hr over 120 Minutes Intravenous Every 12 hours 02/22/20 1030 02/27/20 1129   02/21/20 1200  cefTRIAXone (ROCEPHIN) 1 g in sodium chloride 0.9 % 100 mL IVPB     Discontinue     1 g 200 mL/hr over 30 Minutes Intravenous Every 24 hours 02/21/20 1050 02/26/20 1159       Objective   Vitals:   02/24/20 2336 02/25/20 0314 02/25/20 0320 02/25/20 1215  BP: (!) 111/98 126/87  138/66  Pulse: 86 84  (!) 130  Resp: 19 20 17  (!) 27  Temp: 97.6 F (36.4 C) 97.7 F (36.5 C)  97.7 F (36.5 C)  TempSrc: Oral Oral  Temporal  SpO2: 100% 98% 99% 100%  Weight:   84.3 kg   Height:        Intake/Output Summary (Last 24 hours) at 02/25/2020 1341 Last data filed at 02/25/2020 3785 Gross per 24 hour  Intake -  Output 250 ml  Net -250 ml    07/11 1901 - 07/13 0700 In: 1169.7 [P.O.:360; I.V.:368.3] Out:  1150 [Urine:1150]  Filed Weights   02/23/20 0435 02/24/20 0634 02/25/20 0320  Weight: 91.9 kg 92.7 kg 84.3 kg    Physical Examination:  General-appears in no acute distress Heart-S1-S2, irregular, no murmur auscultated Lungs-clear to auscultation bilaterally, no wheezing or crackles auscultated Abdomen-soft, nontender, no organomegaly Extremities-no edema in the lower extremities Neuro-alert, oriented x3, no focal deficit noted  Data Reviewed:   Recent Results (from the past 240 hour(s))  SARS Coronavirus 2 by RT PCR (hospital order, performed in [place] hospital lab) Nasopharyngeal Nasopharyngeal Swab     Status: None   Collection Time: 02/19/20  1:19 PM   Specimen: Nasopharyngeal Swab  Result Value Ref Range Status   SARS Coronavirus 2 NEGATIVE NEGATIVE Final    Comment: (NOTE) SARS-CoV-2 target nucleic acids are NOT DETECTED.  The SARS-CoV-2 RNA is generally detectable in upper and lower respiratory specimens during the acute phase of infection. The lowest concentration of SARS-CoV-2 viral copies this assay can detect is 250 copies / mL. A negative result does not preclude SARS-CoV-2 infection and should not be used as the sole basis for treatment or other patient management decisions.  A  negative result may occur with improper specimen collection / handling, submission of specimen other than nasopharyngeal swab, presence of viral mutation(s) within the areas targeted by this assay, and inadequate number of viral copies (<250 copies / mL). A negative result must be combined with clinical observations, patient history, and epidemiological information.  Fact Sheet for Patients:   StrictlyIdeas.no  Fact Sheet for Healthcare Providers: BankingDealers.co.za  This test is not yet approved or  cleared by the [place] FDA and has been authorized for detection and/or diagnosis of SARS-CoV-2 by FDA under an Emergency Use  Authorization (EUA).  This EUA will remain in effect (meaning this test can be used) for the duration of the COVID-19 declaration under Section 564(b)(1) of the Act, 21 U.S.C. section 360bbb-3(b)(1), unless the authorization is terminated or revoked sooner.  Performed at [facility], [place] [address] [place], [place] [address]   Culture, Urine     Status: Abnormal   Collection Time: 02/21/20 10:50 AM   Specimen: Urine, Random  Result Value Ref Range Status   Specimen Description URINE, RANDOM  Final   Special Requests   Final    NONE Performed at [facility], [place] [address] [place], [place] [address]    Culture >=100,000 COLONIES/mL KLEBSIELLA PNEUMONIAE (A)  Final   Report Status 02/23/2020 FINAL  Final   Organism ID, Bacteria KLEBSIELLA PNEUMONIAE (A)  Final      Susceptibility   Klebsiella pneumoniae - MIC*    AMPICILLIN >=32 RESISTANT Resistant     CEFAZOLIN <=4 SENSITIVE Sensitive     CEFTRIAXONE <=0.25 SENSITIVE Sensitive     CIPROFLOXACIN <=0.25 SENSITIVE Sensitive     GENTAMICIN <=1 SENSITIVE Sensitive     IMIPENEM <=0.25 SENSITIVE Sensitive     NITROFURANTOIN 128 RESISTANT Resistant     TRIMETH/SULFA <=20 SENSITIVE Sensitive     AMPICILLIN/SULBACTAM 16 INTERMEDIATE Intermediate     PIP/TAZO <=4 SENSITIVE Sensitive     * >=100,000 COLONIES/mL KLEBSIELLA PNEUMONIAE    BNP (last 3 results) Recent Labs    12/09/19 0624 02/19/20 1330 02/21/20 1151  BNP 161.9* 330.5* 538.2*      [place]   [facility] If 7PM-7AM, please contact night-coverage at [email or web address], Office  [phone]   02/25/2020, 1:41 PM  LOS: 5 days

## 2020-02-25 NOTE — Anesthesia Postprocedure Evaluation (Signed)
Anesthesia Post Note  Patient: [name]  Procedure(s) Performed: CARDIOVERSION (N/A ) TRANSESOPHAGEAL ECHOCARDIOGRAM (TEE) (N/A )     Patient location during evaluation: PACU Anesthesia Type: General Level of consciousness: awake and alert Pain management: pain level controlled Vital Signs Assessment: post-procedure vital signs reviewed and stable Respiratory status: spontaneous breathing, nonlabored ventilation, respiratory function stable and patient connected to nasal cannula oxygen Cardiovascular status: blood pressure returned to baseline and stable Postop Assessment: no apparent nausea or vomiting Anesthetic complications: no   No complications documented.  Last Vitals:  Vitals:   02/25/20 1357 02/25/20 1407  BP: (!) 177/62 (!) 164/69  Pulse: 71 68  Resp: (!) 23 (!) 24  Temp:    SpO2: 92% 92%    Last Pain:  Vitals:   02/25/20 1407  TempSrc:   PainSc: 0-No pain                 Mekhi Lascola DAVID

## 2020-02-25 NOTE — Anesthesia Preprocedure Evaluation (Signed)
Anesthesia Evaluation  Patient identified by MRN, date of birth, ID band Patient awake    Reviewed: Allergy & Precautions, NPO status , Patient's Chart, lab work & pertinent test results  History of Anesthesia Complications (+) DIFFICULT AIRWAY  Airway Mallampati: I  TM Distance: >3 FB Neck ROM: Full    Dental   Pulmonary sleep apnea ,    Pulmonary exam normal        Cardiovascular hypertension, Pt. on medications + CAD and + Past MI  Normal cardiovascular exam+ dysrhythmias Atrial Fibrillation + Valvular Problems/Murmurs AS   H/O TAVR   Neuro/Psych CVA    GI/Hepatic GERD  Medicated and Controlled,  Endo/Other  diabetes  Renal/GU Renal InsufficiencyRenal disease     Musculoskeletal   Abdominal   Peds  Hematology   Anesthesia Other Findings   Reproductive/Obstetrics                             Anesthesia Physical Anesthesia Plan  ASA: III  Anesthesia Plan: MAC   Post-op Pain Management:    Induction:   PONV Risk Score and Plan: Treatment may vary due to age or medical condition  Airway Management Planned: Nasal Cannula  Additional Equipment:   Intra-op Plan:   Post-operative Plan:   Informed Consent: I have reviewed the patients History and Physical, chart, labs and discussed the procedure including the risks, benefits and alternatives for the proposed anesthesia with the patient or authorized representative who has indicated his/her understanding and acceptance.       Plan Discussed with: Surgeon and CRNA  Anesthesia Plan Comments:         Anesthesia Quick Evaluation

## 2020-02-25 NOTE — Interval H&P Note (Signed)
History and Physical Interval Note:  02/25/2020 1:16 PM  [name]  has presented today for surgery, with the diagnosis of [place].  The various methods of treatment have been discussed with the patient and family. After consideration of risks, benefits and other options for treatment, the patient has consented to  Procedure(s): CARDIOVERSION (N/A) TRANSESOPHAGEAL ECHOCARDIOGRAM (TEE) (N/A) as a surgical intervention.  The patient's history has been reviewed, patient examined, no change in status, stable for surgery.  I have reviewed the patient's chart and labs.  Questions were answered to the patient's satisfaction.     [name] [employer]

## 2020-02-25 NOTE — Transfer of Care (Signed)
Immediate Anesthesia Transfer of Care Note  Patient: [name]  Procedure(s) Performed: CARDIOVERSION (N/A ) TRANSESOPHAGEAL ECHOCARDIOGRAM (TEE) (N/A )  Patient Location: PACU  Anesthesia Type:MAC  Level of Consciousness: drowsy and patient cooperative  Airway & Oxygen Therapy: Patient Spontanous Breathing and Patient connected to face mask oxygen  Post-op Assessment: Report given to RN and Post -op Vital signs reviewed and stable  Post vital signs: Reviewed and stable  Last Vitals:  Vitals Value Taken Time  BP 157/59 02/25/20 1346  Temp    Pulse 74 02/25/20 1347  Resp 25 02/25/20 1347  SpO2 91 % 02/25/20 1347  Vitals shown include unvalidated device data.  Last Pain:  Vitals:   02/25/20 1215  TempSrc: Temporal  PainSc: 0-No pain         Complications: No complications documented.

## 2020-02-26 DIAGNOSIS — E785 Hyperlipidemia, unspecified: Secondary | ICD-10-CM

## 2020-02-26 DIAGNOSIS — I480 Paroxysmal atrial fibrillation: Secondary | ICD-10-CM

## 2020-02-26 DIAGNOSIS — N183 Chronic kidney disease, stage 3 unspecified: Secondary | ICD-10-CM

## 2020-02-26 DIAGNOSIS — E669 Obesity, unspecified: Secondary | ICD-10-CM

## 2020-02-26 DIAGNOSIS — E1169 Type 2 diabetes mellitus with other specified complication: Secondary | ICD-10-CM

## 2020-02-26 DIAGNOSIS — I350 Nonrheumatic aortic (valve) stenosis: Secondary | ICD-10-CM

## 2020-02-26 DIAGNOSIS — E1122 Type 2 diabetes mellitus with diabetic chronic kidney disease: Secondary | ICD-10-CM

## 2020-02-26 DIAGNOSIS — I10 Essential (primary) hypertension: Secondary | ICD-10-CM

## 2020-02-26 LAB — GLUCOSE, CAPILLARY
Glucose-Capillary: 224 mg/dL — ABNORMAL HIGH (ref 70–99)
Glucose-Capillary: 284 mg/dL — ABNORMAL HIGH (ref 70–99)
Glucose-Capillary: 225 mg/dL — ABNORMAL HIGH (ref 70–99)
Glucose-Capillary: 222 mg/dL — ABNORMAL HIGH (ref 70–99)
Glucose-Capillary: 229 mg/dL — ABNORMAL HIGH (ref 70–99)

## 2020-02-26 LAB — BASIC METABOLIC PANEL
Anion gap: 14 (ref 5–15)
BUN: 65 mg/dL — ABNORMAL HIGH (ref 8–23)
CO2: 25 mmol/L (ref 22–32)
Calcium: 8.8 mg/dL — ABNORMAL LOW (ref 8.9–10.3)
Chloride: 102 mmol/L (ref 98–111)
Creatinine, Ser: 2.36 mg/dL — ABNORMAL HIGH (ref 0.44–1.00)
GFR calc Af Amer: 24 mL/min — ABNORMAL LOW (ref >60)
GFR calc non Af Amer: 20 mL/min — ABNORMAL LOW (ref >60)
Glucose, Bld: 232 mg/dL — ABNORMAL HIGH (ref 70–99)
Potassium: 3.8 mmol/L (ref 3.5–5.1)
Sodium: 141 mmol/L (ref 135–145)

## 2020-02-26 MED ORDER — HYDRALAZINE HCL 50 MG PO TABS
50.0000 mg | ORAL_TABLET | Freq: Three times a day (TID) | ORAL | Status: DC
  Administered 2020-02-26 – 2020-02-27 (×3): 50 mg via ORAL
  Filled 2020-02-26 (×3): qty 1

## 2020-02-26 MED ORDER — AMIODARONE HCL 200 MG PO TABS
200.0000 mg | ORAL_TABLET | Freq: Two times a day (BID) | ORAL | Status: DC
  Administered 2020-02-26 – 2020-02-27 (×3): 200 mg via ORAL
  Filled 2020-02-26 (×3): qty 1

## 2020-02-26 NOTE — Plan of Care (Signed)
Continue to monitor

## 2020-02-26 NOTE — Progress Notes (Signed)
PROGRESS NOTE    [name]  QQP:[id number] DOB: [birth year] DOA: 02/19/2020 PCP: [name], DO    Brief Narrative:  The patient was admitted to the hospital with working diagnosis of symptomatic bradycardia.  69 year old female with past medical history for paroxysmal atrial fibrillation, coronary artery disease, hypertension, chronic diastolic heart failure, aortic stenosis status post TAVR, chronic kidney disease stage III, type 2 diabetes mellitus, hx of mitral valve endocarditis and obesity class III. Patient had a recent hospitalization, she was discharged on carvedilol and amiodarone.  Follow-up as an outpatient with cardiology, hydralazine was added for blood pressure control.  She reported collapsing after taking carvedilol and hydralazine, no loss of consciousness.  EMS was called, her heart rate was 22, and capillary glucose 40.  On her initial physical examination blood pressure 122/72, heart rate 36-47, respiratory rate 23, oxygen saturation 96%, her lungs were clear to auscultation bilaterally, heart S1-S2 present bradycardic, abdomen protuberant, no lower extremity edema. Sodium 138, potassium 5.0, chloride 106, bicarb 20, glucose 473, BUN 62, creatinine 3.29, white count 7.8, hemoglobin 9.5, hematocrit 32.5, platelets 195.  SARS COVID-19 negative.  Urinalysis negative for infection.  Glucose > 500. CT head with hypoattenuation within the left cerebellar hemisphere.  Chest radiograph with increased lung markings bilaterally. EKG 34 bpm, normal axis, junctional rhythm, normal QTC, no ST segment changes, negative T wave lead I and aVL, poor R wave progression.  Patient received atropine and carvedilol and amiodarone were discontinued. EP consulted, patient was placed on amiodarone infusion and underwent DC cardioversion on 07/13.   Placed on diuresis for volume overload. Follow up chest film with left lower lobe infiltrate. Placed on antibiotic therapy.   Assessment &  Plan:   Principal Problem:   Bradycardia Active Problems:   Hyperlipidemia associated with type 2 diabetes mellitus (HCC)   Coronary artery disease, non-occlusive   CKD stage 3 due to type 2 diabetes mellitus (HCC)   Type II diabetes mellitus with renal manifestations (HCC)   Severe aortic stenosis   Chronic kidney disease   Essential hypertension   Class 3 obesity   1. Bradycardia/ due to AV blockers, in the setting of chronic atrial fibrillation.(sp cardioversion) HR 62 to 63 beats per minute. Patient with no dyspnea or chest pain.   Continue with amiodarone for rate control and apixaban for anticoagulation. Continue telemetry monitoring. Will consult PT and OT before discharge, out of bed to chair tid with meals. 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   2. Acute on chronic diastolic heart failure/ amiloyd cardiomyopathy/ sp TAVR (bioprosthetic valve) acute hypoxic respiratory failure/ Blood pressure stable 712 mmHg systolic, negative fluid balance 4,379 since admission. Serum cr down to 2,36 with K at 3,8.   Continue diuresis with torsemide, to keep negative fluid balance.   3. AKI on CKD stage 4.  Stable renal function with serum cr at 2,36, K at 3,8 and serum bicarbonate at 25.  Will continue diuresis with torsemide. Follow renal function in am.   4. Uncontrolled T2DM Hgb A1c 10,2/ dyslipidemia. Fasting glucose 232. Continue insulin sliding scale for glucose cover and monitoring. Basal insulin 30 units NPH bis and premeal coverage with 6 units aspart.   On fenofibrate.   5. Obesity class 3. Will need outpatient follow up.   6. UTI and left lower lobe pneumonia (present on admission). Patient completed antibiotic therapy  7. HTN.  Continue blood pressure control with hydralazine 50 mg tid.    Status is: Inpatient  Remains inpatient appropriate because:Inpatient level  of care appropriate due to severity of illness   Dispo: The patient is from: Home              Anticipated d/c is to: Home              Anticipated d/c date is: 1 day              Patient currently is not medically stable to d/c.   DVT prophylaxis: apixaban   Code Status:    full  Family Communication:  No family at the bedside     Consultants:   Cardiology    Procedures:   DC cardioversion   Antimicrobials:   Ceftriaxone and doxy    Subjective: Patient continue to be very weak and deconditioned, has not been out of the bed yet, no nausea or vomiting, no chest pain or dyspnea.   Objective: Vitals:   02/26/20 0318 02/26/20 0319 02/26/20 0814 02/26/20 1140  BP: (!) 151/54  (!) 156/70 (!) 162/62  Pulse: 60  64 64  Resp: 20 20 19 15   Temp: (!) 97.4 F (36.3 C)  (!) 97.5 F (36.4 C) 97.7 F (36.5 C)  TempSrc: Oral  Oral Oral  SpO2: 93% 92% 99% 98%  Weight:  83.6 kg    Height:        Intake/Output Summary (Last 24 hours) at 02/26/2020 1237 Last data filed at 02/26/2020 0816 Gross per 24 hour  Intake 1100 ml  Output 402 ml  Net 698 ml   Filed Weights   02/24/20 0634 02/25/20 0320 02/26/20 0319  Weight: 92.7 kg 84.3 kg 83.6 kg    Examination:   General: Not in pain or dyspnea, deconditioned  Neurology: Awake and alert, non focal  E ENT: no pallor, no icterus, oral mucosa moist Cardiovascular: No JVD. S1-S2 present, rhythmic, no gallops, rubs, or murmurs. Trace lower extremity edema. Pulmonary: positive breath sounds bilaterally, adequate air movement, no wheezing, rhonchi or rales. Gastrointestinal. Abdomen with no organomegaly, non tender, no rebound or guarding Skin. No rashes Musculoskeletal: left foot transmetatarsal amputation.      Data Reviewed: I have personally reviewed following labs and imaging studies  CBC: Recent Labs  Lab 02/19/20 1330 02/19/20 2143 02/22/20 1116 02/25/20 0339  WBC 7.8  --  11.4* 6.7  NEUTROABS 5.4  --   --   --     HGB 9.5* 10.9* 11.6* 10.0*  HCT 32.5* 32.0* 37.8 32.8*  MCV 81.3  --  78.8* 78.1*  PLT 195  --  296 762   Basic Metabolic Panel: Recent Labs  Lab 02/20/20 0333 02/21/20 0322 02/22/20 0259 02/22/20 1705 02/23/20 0322 02/23/20 1655 02/24/20 0216 02/25/20 0339 02/26/20 0402  NA 142   < > 137  --  139  --  138 141 141  K 4.4   < > 3.7  --  3.2*  --  4.2 3.7 3.8  CL 107   < > 98  --  98  --  99 102 102  CO2 24   < > 26  --  30  --  26 27 25   GLUCOSE 229*   < > 370*   < > 211* 454* 258* 132* 232*  BUN 57*   < > 47*  --  56*  --  65* 71* 65*  CREATININE 3.28*   < > 2.84*  --  2.68*  --  2.83* 2.69* 2.36*  CALCIUM 8.6*   < > 8.5*  --  8.4*  --  8.6* 8.9 8.8*  MG 2.2  --  2.0  --   --   --   --  2.0  --    < > = values in this interval not displayed.   GFR: Estimated Creatinine Clearance: 25 mL/min (A) (by C-G formula based on SCr of 2.36 mg/dL (H)). Liver Function Tests: No results for input(s): AST, ALT, ALKPHOS, BILITOT, PROT, ALBUMIN in the last 168 hours. No results for input(s): LIPASE, AMYLASE in the last 168 hours. No results for input(s): AMMONIA in the last 168 hours. Coagulation Profile: Recent Labs  Lab 02/19/20 1330  INR 1.8*   Cardiac Enzymes: No results for input(s): CKTOTAL, CKMB, CKMBINDEX, TROPONINI in the last 168 hours. BNP (last 3 results) No results for input(s): PROBNP in the last 8760 hours. HbA1C: No results for input(s): HGBA1C in the last 72 hours. CBG: Recent Labs  Lab 02/25/20 1602 02/25/20 2206 02/26/20 0601 02/26/20 0813 02/26/20 1118  GLUCAP 277* 299* 224* 284* 225*   Lipid Profile: No results for input(s): CHOL, HDL, LDLCALC, TRIG, CHOLHDL, LDLDIRECT in the last 72 hours. Thyroid Function Tests: Recent Labs    02/25/20 0339  TSH 1.435   Anemia Panel: No results for input(s): VITAMINB12, FOLATE, FERRITIN, TIBC, IRON, RETICCTPCT in the last 72 hours.    Radiology Studies: I have reviewed all of the imaging during this  hospital visit personally     Scheduled Meds: . amiodarone  200 mg Oral BID  . apixaban  5 mg Oral BID  . calcium carbonate  1,250 mg Oral Q breakfast  . fenofibrate  54 mg Oral Daily  . hydrALAZINE  50 mg Oral Q8H  . insulin aspart  0-20 Units Subcutaneous TID WC  . insulin aspart  0-5 Units Subcutaneous QHS  . insulin aspart  6 Units Subcutaneous TID WC  . insulin NPH Human  30 Units Subcutaneous BID WC  . omega-3 acid ethyl esters  2 g Oral BID  . Tafamidis  61 mg Oral Daily  . torsemide  20 mg Oral BID  . vitamin B-12  100 mcg Oral Daily   Continuous Infusions: . sodium chloride    . doxycycline (VIBRAMYCIN) IV 100 mg (02/26/20 1147)     LOS: 6 days        Tyauna Lacaze Gerome Apley, MD

## 2020-02-26 NOTE — Progress Notes (Addendum)
Patient ID: [name], female   DOB: 09/10/1950, 69 y.o.   MRN: [id number]     Advanced Heart Failure Rounding Note  PCP-Cardiologist: Minus Breeding, MD   Subjective:    S/p successful TEE/DCCV 7/13. Remains in NSR/ sinus brady today.   On abx for LLL PNA and UTI with Klebsiella. Ceftriaxone completed yesterday. Remains on doxy (to end today)   Afebrile.   Had BM yesterday w/ sorbitol   Scr improving, 2.83>>2.69>>2.36.   In bed. Feels tired but no other complaints.    Objective:   Weight Range: 83.6 kg Body mass index is 28.87 kg/m.   Vital Signs:   Temp:  [97.4 F (36.3 C)-97.9 F (36.6 C)] 97.5 F (36.4 C) (07/14 0814) Pulse Rate:  [60-130] 64 (07/14 0814) Resp:  [18-29] 19 (07/14 0814) BP: (108-177)/(46-89) 156/70 (07/14 0814) SpO2:  [91 %-100 %] 99 % (07/14 0814) Weight:  [83.6 kg] 83.6 kg (07/14 0319) Last BM Date: 02/25/20  Weight change: Filed Weights   02/24/20 0634 02/25/20 0320 02/26/20 0319  Weight: 92.7 kg 84.3 kg 83.6 kg    Intake/Output:   Intake/Output Summary (Last 24 hours) at 02/26/2020 0943 Last data filed at 02/26/2020 0816 Gross per 24 hour  Intake 1100 ml  Output 402 ml  Net 698 ml      Physical Exam    General:  Supper morbidly obese WF. No respiratory difficulty HEENT: normal Neck: supple. Thick neck, JVD assessment difficult due to body habitus. Carotids 2+ bilat; no bruits. No lymphadenopathy or thyromegaly appreciated. Cor: PMI nondisplaced. Regular rate & rhythm. No rubs, gallops or murmurs. Lungs: clear Abdomen: obese, soft, nontender, nondistended. No hepatosplenomegaly. No bruits or masses. Good bowel sounds. Extremities: no cyanosis, clubbing, rash, edema Neuro: alert & oriented x 3, cranial nerves grossly intact. moves all 4 extremities w/o difficulty. Affect pleasant.     Telemetry   NSR/ sinus brady, upper 50s-low 60s  personally reviewed.   Labs    CBC Recent Labs    02/25/20 0339  WBC 6.7  HGB  10.0*  HCT 32.8*  MCV 78.1*  PLT 196   Basic Metabolic Panel Recent Labs    02/25/20 0339 02/26/20 0402  NA 141 141  K 3.7 3.8  CL 102 102  CO2 27 25  GLUCOSE 132* 232*  BUN 71* 65*  CREATININE 2.69* 2.36*  CALCIUM 8.9 8.8*  MG 2.0  --    Liver Function Tests No results for input(s): AST, ALT, ALKPHOS, BILITOT, PROT, ALBUMIN in the last 72 hours. No results for input(s): LIPASE, AMYLASE in the last 72 hours. Cardiac Enzymes No results for input(s): CKTOTAL, CKMB, CKMBINDEX, TROPONINI in the last 72 hours.  BNP: BNP (last 3 results) Recent Labs    12/09/19 0624 02/19/20 1330 02/21/20 1151  BNP 161.9* 330.5* 538.2*    ProBNP (last 3 results) No results for input(s): PROBNP in the last 8760 hours.   D-Dimer No results for input(s): DDIMER in the last 72 hours. Hemoglobin A1C No results for input(s): HGBA1C in the last 72 hours. Fasting Lipid Panel No results for input(s): CHOL, HDL, LDLCALC, TRIG, CHOLHDL, LDLDIRECT in the last 72 hours. Thyroid Function Tests Recent Labs    02/25/20 0339  TSH 1.435    Other results:   Imaging    No results found.   Medications:     Scheduled Medications: . apixaban  5 mg Oral BID  . calcium carbonate  1,250 mg Oral Q breakfast  . fenofibrate  54 mg Oral Daily  . hydrALAZINE  25 mg Oral Q8H  . insulin aspart  0-20 Units Subcutaneous TID WC  . insulin aspart  0-5 Units Subcutaneous QHS  . insulin aspart  6 Units Subcutaneous TID WC  . insulin NPH Human  30 Units Subcutaneous BID WC  . omega-3 acid ethyl esters  2 g Oral BID  . Tafamidis  61 mg Oral Daily  . torsemide  20 mg Oral BID  . vitamin B-12  100 mcg Oral Daily    Infusions: . sodium chloride    . amiodarone 30 mg/hr (02/26/20 0824)  . doxycycline (VIBRAMYCIN) IV 100 mg (02/25/20 2225)    PRN Medications: sodium chloride, acetaminophen, guaiFENesin, hydrALAZINE, levalbuterol, meclizine, ondansetron    Assessment/Plan   1. Tachy-brady  syndrome: Admitted with junctional bradycardia on amiodarone + Coreg.  Now off Coreg. No  Further significant brachycardia on tele. S/p successful TEE/DCCV 7/13. Currently sinus brady, upper 50s - Stop amio gtt. Change to PO 200 mg bid  - no indication for PPM at this time  - Continue apixaban 5 mg bid  2. AKI on CKD stage 4: Creatinine improving, 2.83>>2.69>>2.36 (as high as 3.3 this admit). Volume status improved.  - restart  PO diuretics today, torsemide 20 mg bid  3. CAD: No chest pain.  - No ASA given apixaban use.  - Continue pitivastatin and Praluent at home.    - Continue fenofibrate and vascepa.  4. HTN: BP elevated, SBPs 150s-170s    - Increase hydralazine to 50 mg tid 5. PAD: S/p PTCA to right popliteal and right AT in 7/20.  Now with left distal SFA-TP trunk bypass and left TMA in 2/21.   - She has followup with [facility] at [facility]. 6. 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  - She is on tafamidis, continue   7. 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  8. 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   9. UTI: Klebsiella UTI -  completed course of ceftriaxone on 7/13 10. Acute on chronic diastolic CHF:  Echo in 0/62 showed EF 60-65%, moderate LVH, RV normal, s/p TAVR with stable bioprosthetic aortic valve. Oxygen saturation running low (not on oxygen at home).  This is complicated by AKI on CKD 4.  - Volume status improved. Wt below previous dry wt.  - resume PO diuretics today, torsemide 20 mg bid - Multiple admits for a/c CHF in last 6 months. Consider cardiomems implant.  11. OSA: She has baseline OSA but has been unable to tolerate CPAP.  12. Acute hypoxemic respiratory failure: Suspect combination of CHF and possible LLL PNA.  Now on 3L .  - Volume status improved.  Continue PO diuretic per above   - Covering for PNA with ceftriaxone/doxycycline.  13. Constipation  - resolved w/ sorbitol   Encouraged to ambulate today. [place] for d/c home from cardiac standpoint, if no issues ambulating.   Cardiac Meds for Discharge Amiodarone 200 mg bid Apixaban 5 mg bid Fenofibrate 54 mg daily  Hydralazine 50 mg tid Lovaza 2 g bid Tafamidis 61 mg daily  Torsemide 20 mg bid  [facility] f[facility]u arranged. Also has Afib clinic f/u on 7/19. Appt info in AVS   Length of Stay: [address] Ladoris Gene  02/26/2020, 9:43 AM  Advanced Heart Failure Team Pager [phone] (M-F; 7a -  4p)  Please contact [facility] for night-coverage after hours (4p -7a ) and weekends on [email or web address]  Patient seen with PA, agree with the above note.   She remains in NSR this morning.  Volume status looks ok. Has not been out of bed.   General: NAD Neck: Thick, no JVD, no thyromegaly or thyroid nodule.  Lungs: Clear to auscultation bilaterally with normal respiratory effort. CV: Nondisplaced PMI.  Heart regular S1/S2, no S3/S4, no murmur.  No peripheral edema.   Abdomen: Soft, nontender, no hepatosplenomegaly, no distention.  Skin: Intact without lesions or rashes.  Neurologic: Alert and oriented x 3.  Psych: Normal affect. Extremities: No clubbing or cyanosis.  HEENT: Normal.   Stop IV amiodarone, convert to po 200 mg bid.  Will need to follow over time, suspect significant risk for atrial fibrillation recurring.    Volume status looks ok, continue torsemide 20 mg po bid.   She will complete abx for possible PNA today.   Ok for discharge from my standpoint.  Will arrange followup.   [name] 02/26/2020 10:28 AM

## 2020-02-27 ENCOUNTER — Encounter (HOSPITAL_COMMUNITY): Payer: Self-pay | Admitting: Cardiology

## 2020-02-27 ENCOUNTER — Telehealth: Payer: Self-pay | Admitting: *Deleted

## 2020-02-27 ENCOUNTER — Other Ambulatory Visit: Payer: Self-pay

## 2020-02-27 DIAGNOSIS — N184 Chronic kidney disease, stage 4 (severe): Secondary | ICD-10-CM

## 2020-02-27 DIAGNOSIS — Z794 Long term (current) use of insulin: Secondary | ICD-10-CM

## 2020-02-27 DIAGNOSIS — E1121 Type 2 diabetes mellitus with diabetic nephropathy: Secondary | ICD-10-CM

## 2020-02-27 DIAGNOSIS — E1122 Type 2 diabetes mellitus with diabetic chronic kidney disease: Secondary | ICD-10-CM

## 2020-02-27 LAB — BASIC METABOLIC PANEL
Anion gap: 13 (ref 5–15)
BUN: 61 mg/dL — ABNORMAL HIGH (ref 8–23)
CO2: 27 mmol/L (ref 22–32)
Calcium: 8.9 mg/dL (ref 8.9–10.3)
Chloride: 103 mmol/L (ref 98–111)
Creatinine, Ser: 2.69 mg/dL — ABNORMAL HIGH (ref 0.44–1.00)
GFR calc Af Amer: 20 mL/min — ABNORMAL LOW (ref >60)
GFR calc non Af Amer: 17 mL/min — ABNORMAL LOW (ref >60)
Glucose, Bld: 117 mg/dL — ABNORMAL HIGH (ref 70–99)
Potassium: 3.3 mmol/L — ABNORMAL LOW (ref 3.5–5.1)
Sodium: 143 mmol/L (ref 135–145)

## 2020-02-27 LAB — MAGNESIUM: Magnesium: 2 mg/dL (ref 1.7–2.4)

## 2020-02-27 LAB — GLUCOSE, CAPILLARY
Glucose-Capillary: 108 mg/dL — ABNORMAL HIGH (ref 70–99)
Glucose-Capillary: 210 mg/dL — ABNORMAL HIGH (ref 70–99)

## 2020-02-27 MED ORDER — AMIODARONE HCL 200 MG PO TABS: 200.0000 mg | ORAL_TABLET | Freq: Two times a day (BID) | ORAL | 0 refills | Status: DC

## 2020-02-27 MED ORDER — POTASSIUM CHLORIDE CRYS ER 20 MEQ PO TBCR: 30.0000 meq | EXTENDED_RELEASE_TABLET | Freq: Once | ORAL | Status: DC

## 2020-02-27 MED ORDER — TORSEMIDE 20 MG PO TABS: 20.0000 mg | ORAL_TABLET | Freq: Two times a day (BID) | ORAL | 0 refills | Status: DC

## 2020-02-27 MED ORDER — POTASSIUM CHLORIDE CRYS ER 20 MEQ PO TBCR
20.0000 meq | EXTENDED_RELEASE_TABLET | Freq: Once | ORAL | Status: AC
  Administered 2020-02-27: 20 meq via ORAL
  Filled 2020-02-27: qty 1

## 2020-02-27 MED ORDER — AMIODARONE HCL 200 MG PO TABS: ORAL_TABLET | ORAL | 0 refills | Status: DC

## 2020-02-27 MED ORDER — HYDRALAZINE HCL 50 MG PO TABS: 50.0000 mg | ORAL_TABLET | Freq: Three times a day (TID) | ORAL | 0 refills | Status: DC

## 2020-02-27 NOTE — Evaluation (Signed)
Physical Therapy Evaluation Patient Details Name: [name] MRN: [id number] DOB: 30-Apr-1951 Today's Date: 02/27/2020   History of Present Illness  69 y.o. female presenting with bradycardia due to AV blockers, acute on chronic diastolic CHF, AKI on CKD, and and A-fib s/p cardiversion and TEE. PMHx significant for CKD, DM II, CAD, HTN, PAD, CHF, OSA and aortic stenosis s/p TAVR.   Clinical Impression  Pt admitted with above diagnosis and presents to PT with functional limitations due to deficits listed below (See PT problem list). Pt needs skilled PT to maximize independence and safety to allow discharge to home with family and HHPT.      Follow Up Recommendations Home health PT;Supervision for mobility/OOB    Equipment Recommendations  None recommended by PT    Recommendations for Other Services       Precautions / Restrictions Precautions Precautions: Fall Restrictions Weight Bearing Restrictions: Yes      Mobility  Bed Mobility Overal bed mobility: Needs Assistance Bed Mobility: Supine to Sit;Sit to Supine     Supine to sit: Min assist;HOB elevated Sit to supine: Supervision   General bed mobility comments: Assist to pull up trunk into sitting  Transfers Overall transfer level: Needs assistance Equipment used: Rolling walker (2 wheeled) Transfers: Sit to/from [employer] Sit to Stand: Min guard Stand pivot transfers: Min guard       General transfer comment: Assist for lines/safety  Ambulation/Gait Ambulation/Gait assistance: Min guard Gait Distance (Feet): 35 Feet Assistive device: Rolling walker (2 wheeled) Gait Pattern/deviations: Step-through pattern;Decreased stride length Gait velocity: decr Gait velocity interpretation: <1.31 ft/sec, indicative of household ambulator General Gait Details: Assist for Scientist, research (medical)    Modified Rankin (Stroke Patients Only)       Balance Overall  balance assessment: Needs assistance Sitting-balance support: No upper extremity supported;Feet supported Sitting balance-Leahy Scale: Fair     Standing balance support: Single extremity supported Standing balance-Leahy Scale: Poor Standing balance comment: UE support for static standing                             Pertinent Vitals/Pain Pain Assessment: 0-10 Pain Score: 3  Pain Location: Headache Pain Intervention(s): Monitored during session    Home Living Family/patient expects to be discharged to:: Private residence Living Arrangements: Spouse/significant other Available Help at Discharge: Family Type of Home: House Home Access: Stairs to enter Entrance Stairs-Rails: None Entrance Stairs-Number of Steps: 1 Home Layout: One level Home Equipment: Walker - standard;Wheelchair - [employer];Shower seat Additional Comments: Three daughters (two respiratory therapists, one RN)    Prior Function Level of Independence: Needs assistance   Gait / Transfers Assistance Needed: Pt reports using WC for longer distances outside of home and uses RW or furniture/wall surfs for mobiliy within the home.  ADL's / Homemaking Assistance Needed: Pt reports husband assists with ADL and IADL tasks (washes back and manages water for her, helps with socks, shoes, and starting underwear/pants)  Comments: Information from prior encounter and verified with pt     Hand Dominance   Dominant Hand: Right    Extremity/Trunk Assessment   Upper Extremity Assessment Upper Extremity Assessment: Defer to OT evaluation    Lower Extremity Assessment Lower Extremity Assessment: Generalized weakness       Communication   Communication: HOH  Cognition Arousal/Alertness: Awake/alert Behavior During Therapy: WFL for tasks assessed/performed Overall Cognitive  Status: Within Functional Limits for tasks assessed                                        General  Comments      Exercises     Assessment/Plan    PT Assessment Patient needs continued PT services  PT Problem List Decreased strength;Decreased activity tolerance;Decreased balance;Decreased mobility;Obesity       PT Treatment Interventions DME instruction;Gait training;Functional mobility training;Therapeutic activities;Therapeutic exercise;Balance training;Patient/family education    PT Goals (Current goals can be found in the Care Plan section)  Acute Rehab PT Goals Patient Stated Goal: go home PT Goal Formulation: With patient Time For Goal Achievement: 03/05/20 Potential to Achieve Goals: Good    Frequency Min 3X/week   Barriers to discharge        Co-evaluation               AM-PAC PT "6 Clicks" Mobility  Outcome Measure Help needed turning from your back to your side while in a flat bed without using bedrails?: None Help needed moving from lying on your back to sitting on the side of a flat bed without using bedrails?: A Little Help needed moving to and from a bed to a chair (including a wheelchair)?: A Little Help needed standing up from a chair using your arms (e.g., wheelchair or bedside chair)?: A Little Help needed to walk in hospital room?: A Little Help needed climbing 3-5 steps with a railing? : A Lot 6 Click Score: 18    End of Session Equipment Utilized During Treatment: Gait belt Activity Tolerance: Patient limited by fatigue Patient left: in bed;with call bell/phone within reach;with bed alarm set   PT Visit Diagnosis: Other abnormalities of gait and mobility (R26.89);Muscle weakness (generalized) (M62.81)    Time: 1610-9604 PT Time Calculation (min) (ACUTE ONLY): 16 min   Charges:   PT Evaluation $PT Eval Moderate Complexity: Erin Pager [phone] Office [place] 02/27/2020, 10:02 AM

## 2020-02-27 NOTE — Progress Notes (Addendum)
Patient ID: [name], female   DOB: Jun 08, 1951, 69 y.o.   MRN: [id number]     Advanced Heart Failure Rounding Note  PCP-Cardiologist: Minus Breeding, MD   Subjective:    S/p successful TEE/DCCV 7/13. Remains in NSR today on tele. HR in the 80s. No recurrent Afib.   Has completed abx course for both CAP and Klebsiella UTI. AF.   Back on PO diuretics. Daily wts have not been accurate but volume status ok on exam. SCr increased from 2.36>>2.69 w/ restart of torsemide, but c/w her baseline.   No cardiac complaints. No supp O2 requirements currently.   Planning potential d/c home today after PT/OT assessment for [facility] needs.    Objective:   Weight Range: 91.7 kg Body mass index is 31.66 kg/m.   Vital Signs:   Temp:  [97.7 F (36.5 C)-98 F (36.7 C)] 97.7 F (36.5 C) (07/15 0759) Pulse Rate:  [64-70] 69 (07/15 0759) Resp:  [15-20] 17 (07/15 0759) BP: (137-162)/(42-64) 139/45 (07/15 0759) SpO2:  [94 %-98 %] 94 % (07/15 0759) Weight:  [91.7 kg] 91.7 kg (07/15 0316) Last BM Date: 02/25/20  Weight change: Filed Weights   02/25/20 0320 02/26/20 0319 02/27/20 0316  Weight: 84.3 kg 83.6 kg 91.7 kg    Intake/Output:   Intake/Output Summary (Last 24 hours) at 02/27/2020 0903 Last data filed at 02/27/2020 0856 Gross per 24 hour  Intake 490 ml  Output 500 ml  Net -10 ml      Physical Exam    General:  Supper morbidly obese WF. No respiratory difficulty HEENT: normal Neck: supple. Thick neck, JVD assessment difficult. Carotids 2+ bilat; no bruits. No lymphadenopathy or thyromegaly appreciated. Cor: PMI nondisplaced. Regular rate & rhythm. No rubs, gallops or murmurs. Lungs: clear, no wheezing  Abdomen: obese, soft, nontender, nondistended. No hepatosplenomegaly. No bruits or masses. Good bowel sounds. Extremities: no cyanosis, clubbing, rash, edema. S/p left transmetatarsal amputation  Neuro: alert & oriented x 3, cranial nerves grossly intact. moves all 4 extremities  w/o difficulty. Affect pleasant.     Telemetry   NSR 80s. No recurrent afib  personally reviewed.   Labs    CBC Recent Labs    02/25/20 0339  WBC 6.7  HGB 10.0*  HCT 32.8*  MCV 78.1*  PLT 376   Basic Metabolic Panel Recent Labs    02/25/20 0339 02/25/20 0339 02/26/20 0402 02/27/20 0427  NA 141   < > 141 143  K 3.7   < > 3.8 3.3*  CL 102   < > 102 103  CO2 27   < > 25 27  GLUCOSE 132*   < > 232* 117*  BUN 71*   < > 65* 61*  CREATININE 2.69*   < > 2.36* 2.69*  CALCIUM 8.9   < > 8.8* 8.9  MG 2.0  --   --  2.0   < > = values in this interval not displayed.   Liver Function Tests No results for input(s): AST, ALT, ALKPHOS, BILITOT, PROT, ALBUMIN in the last 72 hours. No results for input(s): LIPASE, AMYLASE in the last 72 hours. Cardiac Enzymes No results for input(s): CKTOTAL, CKMB, CKMBINDEX, TROPONINI in the last 72 hours.  BNP: BNP (last 3 results) Recent Labs    12/09/19 0624 02/19/20 1330 02/21/20 1151  BNP 161.9* 330.5* 538.2*    ProBNP (last 3 results) No results for input(s): PROBNP in the last 8760 hours.   D-Dimer No results for input(s): DDIMER in  the last 72 hours. Hemoglobin A1C No results for input(s): HGBA1C in the last 72 hours. Fasting Lipid Panel No results for input(s): CHOL, HDL, LDLCALC, TRIG, CHOLHDL, LDLDIRECT in the last 72 hours. Thyroid Function Tests Recent Labs    02/25/20 0339  TSH 1.435    Other results:   Imaging    No results found.   Medications:     Scheduled Medications:  amiodarone  200 mg Oral BID   apixaban  5 mg Oral BID   calcium carbonate  1,250 mg Oral Q breakfast   fenofibrate  54 mg Oral Daily   hydrALAZINE  50 mg Oral Q8H   insulin aspart  0-20 Units Subcutaneous TID WC   insulin aspart  0-5 Units Subcutaneous QHS   insulin aspart  6 Units Subcutaneous TID WC   insulin NPH Human  30 Units Subcutaneous BID WC   omega-3 acid ethyl esters  2 g Oral BID   Tafamidis  61 mg Oral  Daily   torsemide  20 mg Oral BID   vitamin B-12  100 mcg Oral Daily    Infusions:  sodium chloride      PRN Medications: sodium chloride, acetaminophen, guaiFENesin, levalbuterol, meclizine, ondansetron    Assessment/Plan   1. Tachy-brady syndrome: Admitted with junctional bradycardia on amiodarone + Coreg.  Now off Coreg. No  Further significant brachycardia on tele. S/p successful TEE/DCCV 7/13. Maintaining NSR. No recurrent afib. - Continue PO amiodarone 200 mg bid  - no indication for PPM at this time  - Continue apixaban 5 mg bid  2. AKI on CKD stage 4: Resolved. Creatinine 2.69 c/w baseline (peaked to 3.3 this admit). Volume status improved.   - Continue torsemide 20 mg bid  3. CAD: No chest pain.  - No ASA given apixaban use.  - Continue pitivastatin and Praluent at home.    - Continue fenofibrate and vascepa.  4. HTN: controlled on current regimen  5. PAD: S/p PTCA to right popliteal and right AT in 7/20.  Now with left distal SFA-TP trunk bypass and left TMA in 2/21.   - She has followup with vascular surgery at [facility]. 6. 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  - She is on tafamidis, continue   7. 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  8. 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   9. UTI: Klebsiella UTI -  completed course of ceftriaxone on 7/13 10. Acute on chronic diastolic CHF:  Echo in 1/93 showed EF 60-65%, moderate LVH, RV normal, s/p TAVR with stable bioprosthetic aortic valve. Diuresed on IV Lasix.  - Volume status improved. Euvolemic - Continue, torsemide 20 mg bid - Multiple admits for a/c CHF in last 6 months. Consider cardiomems implant.  11. OSA: She has baseline OSA but has been unable to tolerate CPAP.  12. Acute hypoxemic respiratory failure:  Suspect combination of CHF and possible LLL PNA.  Treated diuretics and abx w/ improvement in respiratory status. O2 sats stable currently on RA - Volume status improved. Continue PO diuretic per above   - Completed abx course for PNA with ceftriaxone/doxycycline.  13. Constipation  - resolved w/ sorbitol  14. Debility  - plan for d/c to home  - h/o left transmetatarsal ambulation. PT/OT to assess for home health needs    Cardiac Meds for Discharge Amiodarone 200 mg bid x  2 wks then 200 mg daily.  Apixaban 5 mg bid Fenofibrate 54 mg daily  Hydralazine 50 mg tid Lovaza 2 g bid Tafamidis 61 mg daily  Torsemide 20 mg bid  [facility] f/u arranged. Also has Afib clinic f/u on 7/19. Appt info in AVS   Length of Stay: [address] PA-C  02/27/2020, 9:03 AM  Advanced Heart Failure Team Pager [phone] (M-F; 7a - 4p)  Please contact [facility] for night-coverage after hours (4p -7a ) and weekends on [email or web address]  Patient seen with PA, agree with the above note.   She is stable today, remains in NSR.  Creatinine mildly higher at 2.69 but around her baseline.  She is off oxygen.  Would send home on the cardiac meds as above. Would decrease amiodarone from 200 bid to 200 daily after 2 wks.   She should be evaluated for Cardiomems given multiple CHF exacerbation.   [name] 02/27/2020 11:25 AM

## 2020-02-27 NOTE — Progress Notes (Signed)
D/C instructions given to patient. Medications reviewed and all questions answered. IV removed, clean and intact. Husband to escort pt home.  Clyde Canterbury, RN

## 2020-02-27 NOTE — Evaluation (Signed)
Occupational Therapy Evaluation Patient Details Name: [name] MRN: [id number] DOB: 04/08/1951 Today's Date: 02/27/2020    History of Present Illness 69 y.o. female presenting with bradycardia due to AV blockers, acute on chronic diastolic CHF, AKI on CKD, and and A-fib s/p cardiversion and TEE. PMHx significant for CKD, DM II, CAD, HTN, PAD, CHF, OSA and aortic stenosis s/p TAVR.    Clinical Impression   PTA patient was living in a private residence with her spouse [name] and was requiring assistance with BADLs/IADLs including toilet transfers, bathing seated on tub bench, and LB dressing. Patient currently presents near baseline level of function requiring set-up assist grossly for UB BADLs and Min A grossly for LB BADLs and toilet/tub/shower transfers with posterior LOB during stand-pivot transfer from commode this date. No indication of need for continued acute OT services at this time with OT to sign off. Recommendation for return home with family and 24 hour supervision/assist. Patient with request for no follow-up HHOT. Please refer to PT note for additional recommendations.     Follow Up Recommendations  No OT follow up    Equipment Recommendations  None recommended by OT    Recommendations for Other Services       Precautions / Restrictions Precautions Precautions: Fall Restrictions Weight Bearing Restrictions: Yes RLE Weight Bearing: Weight bearing as tolerated LLE Weight Bearing: Weight bearing as tolerated      Mobility Bed Mobility Overal bed mobility: Needs Assistance Bed Mobility: Supine to Sit;Sit to Supine     Supine to sit: Min assist;HOB elevated Sit to supine: Supervision   General bed mobility comments: Assist to pull up trunk into sitting  Transfers Overall transfer level: Needs assistance Equipment used: Rolling walker (2 wheeled) Transfers: Sit to/from [employer] Sit to Stand: Min guard Stand pivot transfers: Min guard        General transfer comment: Assist for lines/safety    Balance Overall balance assessment: Needs assistance Sitting-balance support: No upper extremity supported;Feet supported Sitting balance-Leahy Scale: Fair     Standing balance support: Single extremity supported Standing balance-Leahy Scale: Poor Standing balance comment: UE support for static standing                           ADL either performed or assessed with clinical judgement   ADL Overall ADL's : Needs assistance/impaired     Grooming: Sitting;Wash/dry hands;Wash/dry face;Oral care;Brushing hair;Set up Grooming Details (indicate cue type and reason): [email or web address] seated at sink level         Upper Body Dressing : Set up;Sitting Upper Body Dressing Details (indicate cue type and reason): To don shirt seated EOB Lower Body Dressing: Minimal assistance;Sit to/from stand Lower Body Dressing Details (indicate cue type and reason): Min A to thread LLE through underwear/pants.  Toilet Transfer: Minimal Interior and spatial designer Details (indicate cue type and reason): BSC over toilet in bathroom.  Toileting- Clothing Manipulation and Hygiene: Minimal assistance;Sit to/from stand Toileting - Clothing Manipulation Details (indicate cue type and reason): Min A for recovery after posterior LOB     Functional mobility during ADLs: Min guard       Vision Baseline Vision/History: Wears glasses Wears Glasses: At all times Patient Visual Report: No change from baseline Vision Assessment?: No apparent visual deficits     Perception     Praxis      Pertinent Vitals/Pain Pain Assessment: 0-10 Pain Score: 3  Pain Location: Headache Pain Intervention(s): Monitored during session  Hand Dominance Right   Extremity/Trunk Assessment Upper Extremity Assessment Upper Extremity Assessment: Generalized weakness   Lower Extremity Assessment Lower Extremity Assessment: Generalized weakness    Cervical / Trunk Assessment Cervical / Trunk Assessment: Kyphotic   Communication Communication Communication: HOH   Cognition Arousal/Alertness: Awake/alert Behavior During Therapy: WFL for tasks assessed/performed Overall Cognitive Status: Within Functional Limits for tasks assessed                                     General Comments  S/p transmet amputation LLE 1 yr ago.     Exercises     Shoulder Instructions      Home Living Family/patient expects to be discharged to:: Private residence Living Arrangements: Spouse/significant other Available Help at Discharge: Family Type of Home: House Home Access: Stairs to enter Technical brewer of Steps: 1 Entrance Stairs-Rails: None Home Layout: One level     Bathroom Shower/Tub: Corporate investment banker: Standard Bathroom Accessibility: Yes   Home Equipment: Walker - standard;Wheelchair - [employer];Tub bench   Additional Comments: Three daughters (two respiratory therapists, one RN)      Prior Functioning/Environment Level of Independence: Needs assistance  Gait / Transfers Assistance Needed: Pt reports using WC for longer distances outside of home and uses RW or furniture/wall surfs for mobiliy within the home. ADL's / Homemaking Assistance Needed: Pt reports husband assists with ADL and IADL tasks (washes back and manages water for her, helps with socks, shoes, and starting underwear/pants)   Comments: Information from prior encounter and verified with pt        OT Problem List: Decreased strength;Decreased activity tolerance;Impaired balance (sitting and/or standing)      OT Treatment/Interventions:      OT Goals(Current goals can be found in the care plan section) Acute Rehab OT Goals Patient Stated Goal: go home  OT Frequency:     Barriers to D/C:            Co-evaluation              AM-PAC OT "6 Clicks" Daily Activity     Outcome Measure  Help from another person eating meals?: A Little Help from another person taking care of personal grooming?: A Little Help from another person toileting, which includes using toliet, bedpan, or urinal?: A Little Help from another person bathing (including washing, rinsing, drying)?: A Little Help from another person to put on and taking off regular upper body clothing?: A Little Help from another person to put on and taking off regular lower body clothing?: A Little 6 Click Score: 18   End of Session Equipment Utilized During Treatment: Gait belt;Rolling walker  Activity Tolerance: Patient tolerated treatment well Patient left: in chair;with call bell/phone within reach  OT Visit Diagnosis: Unsteadiness on feet (R26.81);Muscle weakness (generalized) (M62.81)                Time: 7893-8101 OT Time Calculation (min): 40 min Charges:  OT General Charges $OT Visit: 1 Visit OT Evaluation $OT Eval Low Complexity: 1 Low OT Treatments $Self Care/Home Management : 23-37 mins  Eulas Schweitzer H. OTR/L Supplemental OT, Department of rehab services [phone]  Linsy Ehresman R H. 02/27/2020, 11:12 AM

## 2020-02-27 NOTE — Telephone Encounter (Signed)
TRANSITIONAL CARE MANAGEMENT TELEPHONE OUTREACH NOTE   Contact Date: 02/27/2020 Contacted By: Truett Mainland, LPN   DISCHARGE INFORMATION Date of Discharge:02/27/2020 Dis[facility]  Principal Discharge Diagnosis:Bradycardia  Outpatient Follow Up Recommendations (copied from discharge summary) 1. Follow up with Dr. Ronnie Doss in 7 days.  2. Patient placed on 200 mg Amiodarone twice daily for 2 weeks, than continue 200 mg daily.  3. Increased torsemide to 20 mg bid. 4. Increased hydralazine to 50 mg tid. 5. Discontinue carvedilol.  6. Follow up with cardiology as outpatient.  7. Follow up renal function in 7 days.   [name] is a female primary care patient of [name], DO. An outgoing telephone call was made today and I spoke with Wynelle Beckmann.  Ms. Gato condition(s) and treatment(s) were discussed. An opportunity to ask questions was provided and all were answered or forwarded as appropriate.    ACTIVITIES OF DAILY LIVING  [place] lives with their spouse and she can perform ADLs independently with assistance from spouse. her primary caregiver is Koleen Distance. Gottschalk, DO. she is able to depend on her primary caregiver(s) for consistent help. Transportation to appointments, to pick up medications, and to run errands is not a problem.  (Consider referral to [facility] if transportation or a consistent caregiver is a problem)   Fall Risk Fall Risk  01/24/2020 12/20/2019  Falls in the past year? 1 1  Number falls in past yr: 1 0  Injury with Fall? 0 1  Comment - -  Risk for fall due to : Impaired balance/gait;Impaired mobility;History of fall(s) History of fall(s);Impaired balance/gait;Impaired mobility  Follow up Falls evaluation completed Falls prevention discussed    medium [place] Modifications/Assistive Devices Wheelchair: Yes Cane: No Ramp: No Bedside Toilet: No Hospital Bed:  No Other: Stacy she is not receiving home health Nursing and Physical therapy services.  Home Health ordered by hospital physician    MEDICATION RECONCILIATION  Ms. Lichty has been able to pick-up all prescribed discharge medications from the pharmacy.   A post discharge medication reconciliation was performed and the complete medication list was reviewed with the patient/caregiver and is current as of 02/27/2020. Changes highlighted below.  Discontinued Medications STOP taking these medications   carvedilol 25 MG tablet Commonly known as: COREG     Current Medication List Allergies as of 02/27/2020      Reactions   Amlodipine Swelling   Ankle swelling   Atorvastatin Other (See Comments)   Myalgias   Crestor [rosuvastatin Calcium] Other (See Comments)   Stiffness and back pain   Ezetimibe-simvastatin Other (See Comments)   Leg cramps   Sitagliptin Other (See Comments)   Statins Other (See Comments)   Myalgias   Celebrex [celecoxib] Rash   Levemir [insulin Detemir] Rash      Medication List       Accurate as of February 27, 2020  3:29 PM. If you have any questions, ask your nurse or doctor.        Accu-Chek Aviva Plus test strip Generic drug: glucose blood USE TO CHECK BLOOD SUGAR UP TO TWICE DAILY OR AS INSTRUCTED   amiodarone 200 MG tablet Commonly known as: PACERONE Take 1 tablet (200 mg) twice daily for 2 weeks then take 1 tablet (200 mg) once daily   B-D INS SYR ULTRAFINE 1CC/31G 31G X 5/16" 1 ML Misc Generic drug: Insulin Syringe-Needle U-100 USE TO  INJECT INSULIN TWICE A DAY AS INSTRUCTED What changed: See the new instructions.   ReliOn Insulin Syringe 31G X 15/64" 1 ML Misc Generic drug: Insulin Syringe-Needle U-100 Inject 1 Syringe into the skin 3 (three) times daily. What changed: Another medication with the same name was changed. Make sure you understand how and when to take each.   CALCIUM 600 PO Take 1 tablet by mouth daily.   Eliquis 5 MG Tabs  tablet Generic drug: apixaban TAKE 1 TABLET BY MOUTH TWICE A DAY What changed: how much to take   fenofibrate 145 MG tablet Commonly known as: TRICOR TAKE 1 TABLET BY MOUTH EVERY DAY   hydrALAZINE 50 MG tablet Commonly known as: APRESOLINE Take 1 tablet (50 mg total) by mouth every 8 (eight) hours.   insulin NPH Human 100 UNIT/ML injection Commonly known as: NOVOLIN N Inject 20-60 Units into the skin 2 (two) times daily before a meal. 60 units in the am 60 units at lunch and 20 units for supper - on a sliding scale   Insulin Pen Needle 32G X 4 MM Misc Use to inject insulin with insulin pen   Livalo 4 MG Tabs Generic drug: Pitavastatin Calcium Take 4 mg by mouth every evening.   loratadine 10 MG tablet Commonly known as: CLARITIN Take 10 mg by mouth daily.   meclizine 12.5 MG tablet Commonly known as: ANTIVERT Take 1 tablet (12.5 mg total) by mouth 3 (three) times daily as needed for dizziness.   MULTIVITAMIN GUMMIES ADULT PO Taking two gummies by mouth daily   NovoLIN R ReliOn 100 units/mL injection Generic drug: insulin regular Inject 20-30 Units into the skin 3 (three) times daily before meals. Per sliding scale   nutrition supplement (JUVEN) Pack Take 1 packet by mouth 2 (two) times daily between meals.   ondansetron 4 MG disintegrating tablet Commonly known as: Zofran ODT Take 1 tablet (4 mg total) by mouth every 8 (eight) hours as needed for nausea.   Praluent 75 MG/ML Soaj Generic drug: Alirocumab INJECT 75 MG INTO THE SKIN EVERY 14 (FOURTEEN) DAYS.   torsemide 20 MG tablet Commonly known as: DEMADEX Take 1 tablet (20 mg total) by mouth 2 (two) times daily.   Vascepa 1 g capsule Generic drug: icosapent Ethyl TAKE 2 CAPSULES (2 G TOTAL) BY MOUTH 2 (TWO) TIMES A DAY. What changed: See the new instructions.   VITAMIN B-12 PO Take 1 tablet by mouth daily. 2 gummies by mouth daily   VITAMIN D3 PO Take 1 tablet by mouth in the morning and at bedtime.     Vyndamax 61 MG Caps Generic drug: Tafamidis Take 61 mg by mouth daily.        PATIENT EDUCATION & FOLLOW-UP PLAN  An appointment for Transitional Care Management is scheduled with Onyeje M. Christiana Pellant, NP on Tuesday 03/03/2020 at 1100  Take all medications as prescribed  Contact our office by calling [phone] if you have any questions or concerns

## 2020-02-27 NOTE — TOC Initial Note (Signed)
Transition of Care [facility]) - Initial/Assessment Note    Patient Details  Name: [name] MRN: [id number] Date of Birth: 1950/10/25  Transition of Care [facility]) CM/SW Contact:    Sherrilyn Rist Advanced Care Supervisor Phone Number: [phone] 02/27/2020, 12:28 PM  Clinical Narrative:                 Patient lives at home with spouse; PCP: [name], DO; has private insurance with Medicare; [facility] [place]; pt states no problem getting medication; DME - wheel chair, walker, bedside commode; she is active with [facility] as prior to admission for [place]; pt is refusing HHOT at this time; Cory with [name] called to resume [place] services.   Expected Discharge Plan: [place] Barriers to Discharge: No Barriers Identified   Patient Goals and CMS Choice Patient states their goals for this hospitalization and ongoing recovery are:: to get stronger CMS Medicare.gov Compare Post Acute Care list provided to:: Patient Choice offered to / list presented to : Patient, Spouse  Expected Discharge Plan and Services Expected Discharge Plan: [place] In-house Referral: NA Discharge Planning Services: CM Consult Post Acute Care Choice: Home Health   Expected Discharge Date: 02/27/20                         HH Arranged: RN, Disease Management, PT [place] Agency: [place] Date [facility] Agency Contacted: 02/27/20 Time HH Agency Contacted: 1227 Representative spoke with at [place]: Tommi Rumps  Prior Living Arrangements/Services   Lives with:: Spouse Patient language and need for interpreter reviewed:: No Do you feel safe going back to the place where you live?: Yes      Need for Family Participation in Patient Care: Yes (Comment) (supportive spouse)   Current home services: Home PT Criminal Activity/Legal Involvement Pertinent to Current Situation/Hospitalization: No - Comment as  needed  Activities of Daily Living Home Assistive Devices/Equipment: CBG Meter, Eyeglasses, Cane (specify quad or straight) ADL Screening (condition at time of admission) Patient's cognitive ability adequate to safely complete daily activities?: Yes Is the patient deaf or have difficulty hearing?: No Does the patient have difficulty seeing, even when wearing glasses/contacts?: No Does the patient have difficulty concentrating, remembering, or making decisions?: No Patient able to express need for assistance with ADLs?: Yes Does the patient have difficulty dressing or bathing?: No Independently performs ADLs?: Yes (appropriate for developmental age) Does the patient have difficulty walking or climbing stairs?: Yes Weakness of Legs: Both Weakness of Arms/Hands: None  Permission Sought/Granted Permission sought to share information with : Case Manager Permission granted to share information with : Yes, Verbal Permission Granted     Permission granted to share info w AGENCY: [place] granted to share info w Relationship: Spouse     Emotional Assessment Appearance:: Appears stated age Attitude/Demeanor/Rapport: Gracious Affect (typically observed): Accepting Orientation: : Oriented to Self, Oriented to Place, Oriented to  Time, Oriented to Situation Alcohol / Substance Use: Not Applicable Psych Involvement: No (comment)  Admission diagnosis:  Bradycardia [R00.1] Hyperglycemia [R73.9] Elevated troponin [R77.8] Elevated brain natriuretic peptide (BNP) level [R79.89] Chronic kidney disease, unspecified CKD stage [N18.9] Patient Active Problem List   Diagnosis Date Noted  . CKD stage 4 due to type 2 diabetes mellitus ([place]) 02/27/2020  . Essential hypertension 02/26/2020  . Class 3 obesity 02/26/2020  . Bradycardia 02/19/2020  . Hyperglycemia   .  Hospital discharge follow-up 01/24/2020  . Non-STEMI (non-ST elevated myocardial infarction) ([place]) 01/16/2020  . NSTEMI  (non-ST elevated myocardial infarction) ([place]) 01/16/2020  . Acute on chronic diastolic heart failure ([place]) 12/09/2019  . S/P transmetatarsal amputation of foot, left ([place]) 10/23/2019  . Subacute bacterial endocarditis 05/21/2019  . 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 Bacteremia due to Enterococcus   . Pressure injury of skin 04/16/2019  . Infected wound 04/16/2019  . Weakness generalized 04/15/2019  . SIRS (systemic inflammatory response syndrome) ([place]) 04/15/2019  . Anasarca 09/19/2018  . Acute encephalopathy 06/16/2018  . 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 Acute on chronic diastolic CHF (congestive heart failure) ([place]) 05/10/2018  . Morbid obesity ([place])   . Hypertension associated with diabetes ([place])   . Chronic kidney disease   . Sleep apnea   . Hyperlipidemia   . GERD (gastroesophageal reflux disease)   . Persistent atrial fibrillation with rapid ventricular response ([place])   . S/P TAVR (transcatheter aortic valve replacement)   . Severe aortic stenosis   . Vertigo 05/06/2015  . TIA (transient ischemic attack), history of 11/07/2014  . 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 CKD stage 3 due to type 2 diabetes mellitus ([place]) 06/03/2014  . Type II diabetes mellitus with renal manifestations ([place]) 06/03/2014  . Type II diabetes mellitus with peripheral circulatory disorder ([place]) 06/03/2014  . Hyperlipidemia associated with type 2 diabetes mellitus ([place]) 12/26/2008  . Benign essential HTN 12/26/2008  . Coronary artery disease, non-occlusive 12/26/2008  . Pericardial effusion 12/26/2008   PCP:  [name], DO Pharmacy:   [id number] [place], [place] [address] Phone: [phone] Fax: [phone]  [facility] [place] [place] - [address] [place] [address] Phone: [phone] Fax: [phone]     Social Determinants of Health  (SDOH) Interventions    Readmission Risk Interventions Readmission Risk Prevention Plan 04/25/2019 05/10/2018  Transportation Screening Complete Complete  PCP or Specialist Appt within 5-7 Days - Complete  PCP or Specialist Appt within 3-5 Days Patient refused -  Home Care Screening - Complete  Medication Review (RN CM) - Complete  HRI or Home Care Consult Complete -  Social Work Consult for [place] Planning/Counseling Complete -  Palliative Care Screening Not Applicable -  Medication Review (RN Care Manager) Complete -  Some recent data might be hidden

## 2020-02-27 NOTE — TOC Transition Note (Signed)
Transition of Care [facility]) - CM/SW Discharge Note   Patient Details  Name: [name] MRN: [id number] Date of Birth: [birth year]  [facility] of [facility] CM/SW Contact:  Sherrilyn Rist Advanced Care Supervisor  Phone Number: [phone] 02/27/2020, 12:32 PM   Clinical Narrative:    Patient is for discharge home today. No needs identified, she is active with [facility] services as prior to admission; Cory with [name] called for resumption of services. No other needs identified at this time.   Final next level of care: [place] Barriers to Discharge: No Barriers Identified   Patient Goals and CMS Choice Patient states their goals for this hospitalization and ongoing recovery are:: to get stronger CMS Medicare.gov Compare Post Acute Care list provided to:: Patient Choice offered to / list presented to : Patient  Discharge Placement                       Discharge Plan and Services In-house Referral: NA Discharge Planning Services: CM Consult Post Acute Care Choice: Home Health            DME Agency: NA       HH Arranged: RN, Disease Management, PT [place] Agency: [place] Date [place]: 02/26/20 Time [place]: 1232 Representative spoke with at [place]: [place] ([place]) Interventions     Readmission Risk Interventions Readmission Risk Prevention Plan 04/25/2019 05/10/2018  Transportation Screening Complete Complete  PCP or Specialist Appt within 5-7 Days - Complete  PCP or Specialist Appt within 3-5 Days Patient refused -  Home Care Screening - Complete  Medication Review (RN CM) - Complete  HRI or Home Care Consult Complete -  Social Work Consult for [place] Planning/Counseling Complete -  Palliative Care Screening Not Applicable -  Medication Review Press photographer) Complete -  Some recent data might be hidden

## 2020-02-27 NOTE — Discharge Summary (Addendum)
Physician Discharge Summary  [name] AVW:[id number] DOB: 1950-12-31 DOA: 02/19/2020  PCP: [name], DO  Admit date: 02/19/2020 Discharge date: 02/27/2020  Admitted From: Home  Disposition:  Home   Recommendations for Outpatient Follow-up and new medication changes:  1. Follow up with Dr. Ronnie Doss in 7 days.  2. Patient placed on 200 mg Amiodarone twice daily for 2 weeks, than continue 200 mg daily.  3. Increased torsemide to 20 mg bid. 4. Increased hydralazine to 50 mg tid. 5. Discontinue carvedilol.  6. Follow up with cardiology as outpatient.  7. Follow up renal function in 7 days.   Home Health: no   Equipment/Devices:no   Discharge Condition: stable  CODE STATUS: full  Diet recommendation: heart healthy   Brief/Interim Summary: The patient was admitted to the hospital with working diagnosis of symptomatic bradycardia.  69 year old female with past medical history for paroxysmal atrial fibrillation, coronary artery disease, hypertension, chronic diastolic heart failure, aortic stenosis status post TAVR, chronic kidney disease stage IV, type 2 diabetes mellitus, hx of mitral valve endocarditis and obesity class III.  Patient had a recent hospitalization, she was discharged on carvedilol and amiodarone.  Follow-up as an outpatient with cardiology, hydralazine was added for blood pressure control.  She reported collapsing after taking carvedilol and hydralazine, no loss of consciousness.  EMS was called, her heart rate was 22, and capillary glucose 40.  On her initial physical examination blood pressure 122/72, heart rate 36-47, respiratory rate 23, oxygen saturation 96%, her lungs were clear to auscultation bilaterally, heart S1-S2 present bradycardic, abdomen protuberant, no lower extremity edema. Sodium 138, potassium 5.0, chloride 106, bicarb 20, glucose 473, BUN 62, creatinine 3.29, white count 7.8, hemoglobin 9.5, hematocrit 32.5, platelets 195.  SARS  COVID-19 negative.  Urinalysis negative for infection.  Glucose > 500. CT head with hypoattenuation within the left cerebellar hemisphere.  Chest radiograph with increased lung markings bilaterally. EKG 34 bpm, normal axis, junctional rhythm, normal QTC, no ST segment changes, negative T wave lead I and aVL, poor R wave progression.  Patient received atropine and carvedilol and amiodarone were discontinued. EP consulted, patient was placed on amiodarone infusion and underwent DC cardioversion on 07/13.   Placed on diuresis for volume overload. Follow up chest film with left lower lobe infiltrate. Placed on antibiotic therapy for pneumonia.  1.    Symptomatic bradycardia due to AV blockade, chronic atrial fibrillation.  Patient was admitted to the cardiac unit, she was placed on amiodarone and anticoagulation with apixaban. Patient underwent TEE/DC cardioversion on July 13 converting to normal sinus rhythm.  Patient will continue taking amiodarone 200 twice daily, follow-up as an outpatient with cardiology.  2.  Acute on chronic diastolic heart failure, amyloid cardiomyopathy, s/p TAVR, acute hypoxic respiratory failure.  Patient underwent diuresis, negative fluid balance was achieved (-4,629 ml since admission), with significant improvement of her symptoms and oxygenation.   Patient will continue taking torsemide at home, dose increased to twice daily 20 mg. Follow up renal panel in 7 days.   Continue taking tafamidis.   3.  Acute kidney injury on chronic kidney disease stage IV, hypokalemia.  Patient tolerated well diuresis with torsemide, potassium was corrected with potassium chloride.  Discharge sodium 143, potassium 3.3, chloride 103, bicarb 27, glucose 117, BUN 61, creatinine 2.69, magnesium 2.0.   4.  Uncontrolled type 2 diabetes mellitus, hemoglobin A1c 10.2, dyslipidemia.  Patient was placed on insulin therapy while hospitalized, including sliding scale and basal with 30 units  of NPH  twice daily and premeal coverage. At discharge will resume her home insulin regimen.  Continue fenofibrate.  5.  Obesity class III.  Calculated BMI 31.6.  Patient will follow-up as an outpatient.  6.  Urinary tract infection, left lower lobe pneumonia, present on admission.  Her cultures remain no growth, patient completed antibiotic therapy during her hospitalization.  7.  Hypertension.  Hydralazine dose increased to 50 mg 3 times daily for better blood pressure control.  Discharge Diagnoses:  Principal Problem:   Bradycardia Active Problems:   Hyperlipidemia associated with type 2 diabetes mellitus (HCC)   Coronary artery disease, non-occlusive   Type II diabetes mellitus with renal manifestations (HCC)   Severe aortic stenosis   Essential hypertension   Class 3 obesity   CKD stage 4 due to type 2 diabetes mellitus ([place])    Discharge Instructions   Allergies as of 02/27/2020      Reactions   Amlodipine Swelling   Ankle swelling   Atorvastatin Other (See Comments)   Myalgias   Crestor [rosuvastatin Calcium] Other (See Comments)   Stiffness and back pain   Ezetimibe-simvastatin Other (See Comments)   Leg cramps   Sitagliptin Other (See Comments)   Statins Other (See Comments)   Myalgias   Celebrex [celecoxib] Rash   Levemir [insulin Detemir] Rash      Medication List    STOP taking these medications   carvedilol 25 MG tablet Commonly known as: COREG     TAKE these medications   Accu-Chek Aviva Plus test strip Generic drug: glucose blood USE TO CHECK BLOOD SUGAR UP TO TWICE DAILY OR AS INSTRUCTED   amiodarone 200 MG tablet Commonly known as: PACERONE Take 1 tablet (200 mg) twice daily for 2 weeks then take 1 tablet (200 mg) once daily What changed:   medication strength  how much to take  how to take this  when to take this  additional instructions  Another medication with the same name was removed. Continue taking this medication, and follow the  directions you see here.   B-D INS SYR ULTRAFINE 1CC/31G 31G X 5/16" 1 ML Misc Generic drug: Insulin Syringe-Needle U-100 USE TO INJECT INSULIN TWICE A DAY AS INSTRUCTED What changed: See the new instructions.   ReliOn Insulin Syringe 31G X 15/64" 1 ML Misc Generic drug: Insulin Syringe-Needle U-100 Inject 1 Syringe into the skin 3 (three) times daily. What changed: Another medication with the same name was changed. Make sure you understand how and when to take each.   CALCIUM 600 PO Take 1 tablet by mouth daily. Notes to patient: Tomorrow morning 7/16   Eliquis 5 MG Tabs tablet Generic drug: apixaban TAKE 1 TABLET BY MOUTH TWICE A DAY What changed: how much to take Notes to patient: This evening 7/15   fenofibrate 145 MG tablet Commonly known as: TRICOR TAKE 1 TABLET BY MOUTH EVERY DAY Notes to patient: Tomorrow morning 7/15   hydrALAZINE 50 MG tablet Commonly known as: APRESOLINE Take 1 tablet (50 mg total) by mouth every 8 (eight) hours. What changed:   medication strength  how much to take  when to take this Notes to patient: This afternoon 7/15   insulin NPH Human 100 UNIT/ML injection Commonly known as: NOVOLIN N Inject 20-60 Units into the skin 2 (two) times daily before a meal. 60 units in the am 60 units at lunch and 20 units for supper - on a sliding scale   Insulin Pen Needle 32G X  4 MM Misc Use to inject insulin with insulin pen   Livalo 4 MG Tabs Generic drug: Pitavastatin Calcium Take 4 mg by mouth every evening. Notes to patient: Tonight at bedtime 7/15   loratadine 10 MG tablet Commonly known as: CLARITIN Take 10 mg by mouth daily. Notes to patient: Tomorrow morning 7/16   meclizine 12.5 MG tablet Commonly known as: ANTIVERT Take 1 tablet (12.5 mg total) by mouth 3 (three) times daily as needed for dizziness. Notes to patient: As needed   MULTIVITAMIN GUMMIES ADULT PO Taking two gummies by mouth daily Notes to patient: Tomorrow morning  7/16   NovoLIN R ReliOn 100 units/mL injection Generic drug: insulin regular Inject 20-30 Units into the skin 3 (three) times daily before meals. Per sliding scale Notes to patient: This evening 7/15   nutrition supplement (JUVEN) Pack Take 1 packet by mouth 2 (two) times daily between meals. Notes to patient: This evening 7/15   ondansetron 4 MG disintegrating tablet Commonly known as: Zofran ODT Take 1 tablet (4 mg total) by mouth every 8 (eight) hours as needed for nausea. Notes to patient: As needed   Praluent 75 MG/ML Soaj Generic drug: Alirocumab INJECT 75 MG INTO THE SKIN EVERY 14 (FOURTEEN) DAYS.   torsemide 20 MG tablet Commonly known as: DEMADEX Take 1 tablet (20 mg total) by mouth 2 (two) times daily. What changed: when to take this Notes to patient: This evening 7/15   Vascepa 1 g capsule Generic drug: icosapent Ethyl TAKE 2 CAPSULES (2 G TOTAL) BY MOUTH 2 (TWO) TIMES A DAY. What changed: See the new instructions. Notes to patient: This evening 7/15   VITAMIN B-12 PO Take 1 tablet by mouth daily. 2 gummies by mouth daily Notes to patient: Tomorrow morning 7/16   VITAMIN D3 PO Take 1 tablet by mouth in the morning and at bedtime. Notes to patient: This evening 7/15   Vyndamax 61 MG Caps Generic drug: Tafamidis Take 61 mg by mouth daily. Notes to patient: Tomorrow morning 7/16       Follow-up Information    Sherran Needs, NP Follow up on 03/02/2020.   Specialties: Nurse Practitioner, Cardiology Why: [facility] 10:00 AM  Contact information: [place] [address] [phone]        Larey Dresser, MD Follow up on 03/23/2020.   Specialty: Cardiology Why: 1:40 PM  Contact information: [address] [place] [address] [phone]              Allergies  Allergen Reactions  . Amlodipine Swelling    Ankle swelling  . Atorvastatin Other (See Comments)    Myalgias   . Crestor [Rosuvastatin Calcium]  Other (See Comments)    Stiffness and back pain  . Ezetimibe-Simvastatin Other (See Comments)    Leg cramps  . Sitagliptin Other (See Comments)  . Statins Other (See Comments)    Myalgias  . Celebrex [Celecoxib] Rash  . Levemir [Insulin Detemir] Rash    Consultations:  Cardiology    Procedures/Studies: DG Chest 2 View  Result Date: 02/21/2020 CLINICAL DATA:  CHF, history coronary artery disease, TAVR, GERD, type II diabetes mellitus EXAM: CHEST - 2 VIEW COMPARISON:  02/19/2020 FINDINGS: Enlargement of cardiac silhouette post TAVR. Mediastinal contours and pulmonary vascularity normal. Atherosclerotic calcification aorta. LEFT upper greater than lower lobe infiltrate favor pneumonia over asymmetric edema. RIGHT lung clear. No pleural effusion or pneumothorax. Bones demineralized. IMPRESSION: Enlargement of cardiac silhouette post TAVR.  Diffuse LEFT lung infiltrates favor pneumonia. Electronically Signed   By: Lavonia Dana M.D.   On: 02/21/2020 11:34   CT Head Wo Contrast  Result Date: 02/19/2020 CLINICAL DATA:  Encephalopathy EXAM: CT HEAD WITHOUT CONTRAST TECHNIQUE: Contiguous axial images were obtained from the base of the skull through the vertex without intravenous contrast. COMPARISON:  04/15/2019 FINDINGS: Brain: New small to moderate size area of hypoattenuation within the left cerebellar hemisphere. 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 There is no acute intracranial hemorrhage or significant mass effect. There is no extra-axial fluid collection. Ventricles and sulci are within normal limits in size and configuration. Vascular: There is atherosclerotic calcification at the skull base. Skull: Calvarium is unremarkable. Sinuses/Orbits: No acute finding. Other: None. IMPRESSION: New area of hypoattenuation within the left cerebellar hemisphere, which may reflect an age-indeterminate infarct. Suggest MRI for further evaluation. Electronically Signed   By: Macy Mis M.D.   On:  02/19/2020 15:36   DG Chest Portable 1 View  Result Date: 02/19/2020 CLINICAL DATA:  69 year old female with bradycardia. Recent myocardial infarction. EXAM: PORTABLE CHEST 1 VIEW COMPARISON:  Portable chest 01/16/2020 and earlier. FINDINGS: Portable AP semi upright view at 1351 hours. Stable cardiomegaly. Stable lung volumes. Pacer or resuscitation pads project over the left lower chest now. Allowing for portable technique the lungs are clear. No pneumothorax. Negative trachea. IMPRESSION: Stable cardiomegaly. No acute cardiopulmonary abnormality. Electronically Signed   By: Genevie Ann M.D.   On: 02/19/2020 14:25   LONG TERM MONITOR (3-14 DAYS)  Result Date: 02/02/2020 1. Sinus rhythm - avg HR of 73 2. 288 Supraventricular Tachycardia runs occurred, the run with the fastest interval lasting 5 beats with a max rate of 218 bpm, the longest lasting 17.2 secs with an avg rate of 115 bpm. 3. Isolated PACs were frequent (11.3%, [id number]), 4. Rare PVCs (<1.0%) 5. No AF seen Glori Bickers, MD 4:01 PM     Procedures: TEE cardioversion   Subjective: Patient feeling better, no nausea or vomiting, no dyspnea or chest pain.   Discharge Exam: Vitals:   02/27/20 0316 02/27/20 0759  BP:  (!) 139/45  Pulse:  69  Resp: 19 17  Temp:  97.7 F (36.5 C)  SpO2: 95% 94%   Vitals:   02/26/20 2325 02/27/20 0311 02/27/20 0316 02/27/20 0759  BP: 138/64 (!) 143/44  (!) 139/45  Pulse: 66 68  69  Resp: 20 18 19 17   Temp: 98 F (36.7 C) 97.8 F (36.6 C)  97.7 F (36.5 C)  TempSrc: Oral Oral  Oral  SpO2: 96% 95% 95% 94%  Weight:   91.7 kg   Height:        General: Not in pain or dyspnea.  Neurology: Awake and alert, non focal  E ENT: no pallor, no icterus, oral mucosa moist Cardiovascular: No JVD. S1-S2 present, rhythmic, no gallops, rubs, or murmurs. No lower extremity edema. Pulmonary: positive breath sounds bilaterally, adequate air movement, no wheezing, rhonchi or rales. Gastrointestinal. Abdomen  soft with no organomegaly, non tender, no rebound or guarding Skin. No rashes Musculoskeletal: no joint deformities/ left foot transmetatarsal amputation.    The results of significant diagnostics from this hospitalization (including imaging, microbiology, ancillary and laboratory) are listed below for reference.     Microbiology: Recent Results (from the past 240 hour(s))  SARS Coronavirus 2 by RT PCR (hospital order, performed in [facility] hospital lab) Nasopharyngeal Nasopharyngeal Swab     Status: None   Collection Time: 02/19/20  1:19 PM   Specimen: Nasopharyngeal Swab  Result Value Ref Range Status   SARS Coronavirus 2 NEGATIVE NEGATIVE Final    Comment: (NOTE) SARS-CoV-2 target nucleic acids are NOT DETECTED.  The SARS-CoV-2 RNA is generally detectable in upper and lower respiratory specimens during the acute phase of infection. The lowest concentration of SARS-CoV-2 viral copies this assay can detect is 250 copies / mL. A negative result does not preclude SARS-CoV-2 infection and should not be used as the sole basis for treatment or other patient management decisions.  A negative result may occur with improper specimen collection / handling, submission of specimen other than nasopharyngeal swab, presence of viral mutation(s) within the areas targeted by this assay, and inadequate number of viral copies (<250 copies / mL). A negative result must be combined with clinical observations, patient history, and epidemiological information.  Fact Sheet for Patients:   StrictlyIdeas.no  Fact Sheet for Healthcare Providers: BankingDealers.co.za  This test is not yet approved or  cleared by the [place] FDA and has been authorized for detection and/or diagnosis of SARS-CoV-2 by FDA under an Emergency Use Authorization (EUA).  This EUA will remain in effect (meaning this test can be used) for the duration of the COVID-19  declaration under Section 564(b)(1) of the Act, 21 U.S.C. section 360bbb-3(b)(1), unless the authorization is terminated or revoked sooner.  Performed at [facility], [place] [address], [place], [place] [address]   Culture, Urine     Status: Abnormal   Collection Time: 02/21/20 10:50 AM   Specimen: Urine, Random  Result Value Ref Range Status   Specimen Description URINE, RANDOM  Final   Special Requests   Final    NONE Performed at [facility], [place] [address], [place], [place] [address]    Culture >=100,000 COLONIES/mL KLEBSIELLA PNEUMONIAE (A)  Final   Report Status 02/23/2020 FINAL  Final   Organism ID, Bacteria KLEBSIELLA PNEUMONIAE (A)  Final      Susceptibility   Klebsiella pneumoniae - MIC*    AMPICILLIN >=32 RESISTANT Resistant     CEFAZOLIN <=4 SENSITIVE Sensitive     CEFTRIAXONE <=0.25 SENSITIVE Sensitive     CIPROFLOXACIN <=0.25 SENSITIVE Sensitive     GENTAMICIN <=1 SENSITIVE Sensitive     IMIPENEM <=0.25 SENSITIVE Sensitive     NITROFURANTOIN 128 RESISTANT Resistant     TRIMETH/SULFA <=20 SENSITIVE Sensitive     AMPICILLIN/SULBACTAM 16 INTERMEDIATE Intermediate     PIP/TAZO <=4 SENSITIVE Sensitive     * >=100,000 COLONIES/mL KLEBSIELLA PNEUMONIAE     Labs: BNP (last 3 results) Recent Labs    12/09/19 0624 02/19/20 1330 02/21/20 1151  BNP 161.9* 330.5* 732.2*   Basic Metabolic Panel: Recent Labs  Lab 02/22/20 0259 02/22/20 1705 02/23/20 0322 02/23/20 0322 02/23/20 1655 02/24/20 0216 02/25/20 0339 02/26/20 0402 02/27/20 0427  NA 137  --  139  --   --  138 141 141 143  K 3.7  --  3.2*  --   --  4.2 3.7 3.8 3.3*  CL 98  --  98  --   --  99 102 102 103  CO2 26  --  30  --   --  26 27 25 27   GLUCOSE 370*   < > 211*   < > 454* 258* 132* 232* 117*  BUN 47*  --  56*  --   --  65* 71* 65* 61*  CREATININE 2.84*  --  2.68*  --   --  2.83* 2.69* 2.36* 2.69*  CALCIUM 8.5*  --  8.4*  --   --  8.6* 8.9 8.8* 8.9  MG 2.0  --   --   --   --   --   2.0  --  2.0   < > = values in this interval not displayed.   Liver Function Tests: No results for input(s): AST, ALT, ALKPHOS, BILITOT, PROT, ALBUMIN in the last 168 hours. No results for input(s): LIPASE, AMYLASE in the last 168 hours. No results for input(s): AMMONIA in the last 168 hours. CBC: Recent Labs  Lab 02/22/20 1116 02/25/20 0339  WBC 11.4* 6.7  HGB 11.6* 10.0*  HCT 37.8 32.8*  MCV 78.8* 78.1*  PLT 296 266   Cardiac Enzymes: No results for input(s): CKTOTAL, CKMB, CKMBINDEX, TROPONINI in the last 168 hours. BNP: Invalid input(s): POCBNP CBG: Recent Labs  Lab 02/26/20 0813 02/26/20 1118 02/26/20 1736 02/26/20 2205 02/27/20 0550  GLUCAP 284* 225* 222* 229* 108*   D-Dimer No results for input(s): DDIMER in the last 72 hours. Hgb A1c No results for input(s): HGBA1C in the last 72 hours. Lipid Profile No results for input(s): CHOL, HDL, LDLCALC, TRIG, CHOLHDL, LDLDIRECT in the last 72 hours. Thyroid function studies Recent Labs    02/25/20 0339  TSH 1.435   Anemia work up No results for input(s): VITAMINB12, FOLATE, FERRITIN, TIBC, IRON, RETICCTPCT in the last 72 hours. Urinalysis    Component Value Date/Time   COLORURINE YELLOW 02/19/2020 1822   APPEARANCEUR HAZY (A) 02/19/2020 1822   APPEARANCEUR Clear 06/19/2018 1637   LABSPEC 1.011 02/19/2020 1822   PHURINE 5.0 02/19/2020 1822   GLUCOSEU >=500 (A) 02/19/2020 1822   HGBUR SMALL (A) 02/19/2020 1822   BILIRUBINUR NEGATIVE 02/19/2020 1822   BILIRUBINUR Negative 06/19/2018 1637   KETONESUR NEGATIVE 02/19/2020 1822   PROTEINUR NEGATIVE 02/19/2020 1822   UROBILINOGEN 1.0 11/07/2014 2242   NITRITE POSITIVE (A) 02/19/2020 1822   LEUKOCYTESUR MODERATE (A) 02/19/2020 1822   Sepsis Labs Invalid input(s): PROCALCITONIN,  WBC,  LACTICIDVEN Microbiology Recent Results (from the past 240 hour(s))  SARS Coronavirus 2 by RT PCR (hospital order, performed in [place] hospital lab) Nasopharyngeal  Nasopharyngeal Swab     Status: None   Collection Time: 02/19/20  1:19 PM   Specimen: Nasopharyngeal Swab  Result Value Ref Range Status   SARS Coronavirus 2 NEGATIVE NEGATIVE Final    Comment: (NOTE) SARS-CoV-2 target nucleic acids are NOT DETECTED.  The SARS-CoV-2 RNA is generally detectable in upper and lower respiratory specimens during the acute phase of infection. The lowest concentration of SARS-CoV-2 viral copies this assay can detect is 250 copies / mL. A negative result does not preclude SARS-CoV-2 infection and should not be used as the sole basis for treatment or other patient management decisions.  A negative result may occur with improper specimen collection / handling, submission of specimen other than nasopharyngeal swab, presence of viral mutation(s) within the areas targeted by this assay, and inadequate number of viral copies (<250 copies / mL). A negative result must be combined with clinical observations, patient history, and epidemiological information.  Fact Sheet for Patients:   StrictlyIdeas.no  Fact Sheet for Healthcare Providers: BankingDealers.co.za  This test is not yet approved or  cleared by the [place] FDA and has been authorized for detection and/or diagnosis of SARS-CoV-2 by FDA under an Emergency Use Authorization (EUA).  This EUA will remain in effect (meaning this test can be used) for the duration  of the COVID-19 declaration under Section 564(b)(1) of the Act, 21 U.S.C. section 360bbb-3(b)(1), unless the authorization is terminated or revoked sooner.  Performed at [facility], [place] [address] [place], [place] [address]   Culture, Urine     Status: Abnormal   Collection Time: 02/21/20 10:50 AM   Specimen: Urine, Random  Result Value Ref Range Status   Specimen Description URINE, RANDOM  Final   Special Requests   Final    NONE Performed at [facility], [place] [address] [place], [place] [address]    Culture >=100,000 COLONIES/mL KLEBSIELLA PNEUMONIAE (A)  Final   Report Status 02/23/2020 FINAL  Final   Organism ID, Bacteria KLEBSIELLA PNEUMONIAE (A)  Final      Susceptibility   Klebsiella pneumoniae - MIC*    AMPICILLIN >=32 RESISTANT Resistant     CEFAZOLIN <=4 SENSITIVE Sensitive     CEFTRIAXONE <=0.25 SENSITIVE Sensitive     CIPROFLOXACIN <=0.25 SENSITIVE Sensitive     GENTAMICIN <=1 SENSITIVE Sensitive     IMIPENEM <=0.25 SENSITIVE Sensitive     NITROFURANTOIN 128 RESISTANT Resistant     TRIMETH/SULFA <=20 SENSITIVE Sensitive     AMPICILLIN/SULBACTAM 16 INTERMEDIATE Intermediate     PIP/TAZO <=4 SENSITIVE Sensitive     * >=100,000 COLONIES/mL KLEBSIELLA PNEUMONIAE     Time coordinating discharge: 45 minutes  SIGNED:   Tawni Millers, MD  [facility] 02/27/2020, 9:30 AM

## 2020-02-27 NOTE — Progress Notes (Signed)
Plan of care reviewed. Pt's been progressing. NSR with 1st degree AV Block on monitor, HR 70s, BP 138/ 64 -154/ 45 mmHg. On room air SPO2 95-96%, no distress noted to night. We will continue to monitor.  Kennyth Lose, RN

## 2020-03-02 ENCOUNTER — Other Ambulatory Visit (HOSPITAL_COMMUNITY): Payer: Self-pay | Admitting: *Deleted

## 2020-03-02 ENCOUNTER — Encounter (HOSPITAL_COMMUNITY): Payer: Self-pay | Admitting: Nurse Practitioner

## 2020-03-02 ENCOUNTER — Other Ambulatory Visit: Payer: Self-pay

## 2020-03-02 ENCOUNTER — Ambulatory Visit (HOSPITAL_COMMUNITY)
Admit: 2020-03-02 | Discharge: 2020-03-02 | Disposition: A | Discharge status: Home / Self Care | Payer: Medicare Other | Source: Ambulatory Visit | Attending: Nurse Practitioner | Admitting: Nurse Practitioner

## 2020-03-02 VITALS — BP 158/60 | HR 76 | Ht 67.0 in | Wt 199.8 lb

## 2020-03-02 DIAGNOSIS — I5033 Acute on chronic diastolic (congestive) heart failure: Secondary | ICD-10-CM | POA: Insufficient documentation

## 2020-03-02 DIAGNOSIS — I252 Old myocardial infarction: Secondary | ICD-10-CM | POA: Diagnosis not present

## 2020-03-02 DIAGNOSIS — Z6831 Body mass index (BMI) 31.0-31.9, adult: Secondary | ICD-10-CM | POA: Diagnosis not present

## 2020-03-02 DIAGNOSIS — Z888 Allergy status to other drugs, medicaments and biological substances status: Secondary | ICD-10-CM | POA: Insufficient documentation

## 2020-03-02 DIAGNOSIS — I2510 Atherosclerotic heart disease of native coronary artery without angina pectoris: Secondary | ICD-10-CM | POA: Diagnosis not present

## 2020-03-02 DIAGNOSIS — Z8249 Family history of ischemic heart disease and other diseases of the circulatory system: Secondary | ICD-10-CM | POA: Insufficient documentation

## 2020-03-02 DIAGNOSIS — Z87442 Personal history of urinary calculi: Secondary | ICD-10-CM | POA: Diagnosis not present

## 2020-03-02 DIAGNOSIS — D6869 Other thrombophilia: Secondary | ICD-10-CM | POA: Diagnosis not present

## 2020-03-02 DIAGNOSIS — I4891 Unspecified atrial fibrillation: Secondary | ICD-10-CM | POA: Insufficient documentation

## 2020-03-02 DIAGNOSIS — E669 Obesity, unspecified: Secondary | ICD-10-CM | POA: Diagnosis not present

## 2020-03-02 DIAGNOSIS — Z79899 Other long term (current) drug therapy: Secondary | ICD-10-CM | POA: Insufficient documentation

## 2020-03-02 DIAGNOSIS — I43 Cardiomyopathy in diseases classified elsewhere: Secondary | ICD-10-CM | POA: Diagnosis not present

## 2020-03-02 DIAGNOSIS — Z794 Long term (current) use of insulin: Secondary | ICD-10-CM | POA: Insufficient documentation

## 2020-03-02 DIAGNOSIS — Z7901 Long term (current) use of anticoagulants: Secondary | ICD-10-CM | POA: Insufficient documentation

## 2020-03-02 DIAGNOSIS — Z9119 Patient's noncompliance with other medical treatment and regimen: Secondary | ICD-10-CM | POA: Diagnosis not present

## 2020-03-02 DIAGNOSIS — G4730 Sleep apnea, unspecified: Secondary | ICD-10-CM | POA: Insufficient documentation

## 2020-03-02 DIAGNOSIS — M810 Age-related osteoporosis without current pathological fracture: Secondary | ICD-10-CM | POA: Insufficient documentation

## 2020-03-02 DIAGNOSIS — N184 Chronic kidney disease, stage 4 (severe): Secondary | ICD-10-CM | POA: Diagnosis not present

## 2020-03-02 DIAGNOSIS — I350 Nonrheumatic aortic (valve) stenosis: Secondary | ICD-10-CM | POA: Insufficient documentation

## 2020-03-02 DIAGNOSIS — E854 Organ-limited amyloidosis: Secondary | ICD-10-CM | POA: Insufficient documentation

## 2020-03-02 DIAGNOSIS — I4819 Other persistent atrial fibrillation: Secondary | ICD-10-CM | POA: Diagnosis not present

## 2020-03-02 DIAGNOSIS — Z8673 Personal history of transient ischemic attack (TIA), and cerebral infarction without residual deficits: Secondary | ICD-10-CM | POA: Insufficient documentation

## 2020-03-02 DIAGNOSIS — Z833 Family history of diabetes mellitus: Secondary | ICD-10-CM | POA: Insufficient documentation

## 2020-03-02 DIAGNOSIS — Z823 Family history of stroke: Secondary | ICD-10-CM | POA: Insufficient documentation

## 2020-03-02 DIAGNOSIS — I130 Hypertensive heart and chronic kidney disease with heart failure and stage 1 through stage 4 chronic kidney disease, or unspecified chronic kidney disease: Secondary | ICD-10-CM | POA: Insufficient documentation

## 2020-03-02 DIAGNOSIS — I059 Rheumatic mitral valve disease, unspecified: Secondary | ICD-10-CM | POA: Diagnosis not present

## 2020-03-02 DIAGNOSIS — Z9884 Bariatric surgery status: Secondary | ICD-10-CM | POA: Insufficient documentation

## 2020-03-02 DIAGNOSIS — Z89412 Acquired absence of left great toe: Secondary | ICD-10-CM | POA: Diagnosis not present

## 2020-03-02 DIAGNOSIS — E1122 Type 2 diabetes mellitus with diabetic chronic kidney disease: Secondary | ICD-10-CM | POA: Insufficient documentation

## 2020-03-02 DIAGNOSIS — Z952 Presence of prosthetic heart valve: Secondary | ICD-10-CM | POA: Diagnosis not present

## 2020-03-02 LAB — BASIC METABOLIC PANEL
Anion gap: 16 — ABNORMAL HIGH (ref 5–15)
BUN: 60 mg/dL — ABNORMAL HIGH (ref 8–23)
CO2: 31 mmol/L (ref 22–32)
Calcium: 9.1 mg/dL (ref 8.9–10.3)
Chloride: 95 mmol/L — ABNORMAL LOW (ref 98–111)
Creatinine, Ser: 2.9 mg/dL — ABNORMAL HIGH (ref 0.44–1.00)
GFR calc Af Amer: 18 mL/min — ABNORMAL LOW (ref >60)
GFR calc non Af Amer: 16 mL/min — ABNORMAL LOW (ref >60)
Glucose, Bld: 225 mg/dL — ABNORMAL HIGH (ref 70–99)
Potassium: 4.2 mmol/L (ref 3.5–5.1)
Sodium: 142 mmol/L (ref 135–145)

## 2020-03-02 NOTE — Progress Notes (Signed)
Primary Care Physician: [name], DO Referring Physician: [facility] f/u Cardiologist: Dr. Aundra Dubin EP: Dr. Kingsley Spittle Hickmon is a 69 y.o. female with a h/o paroxysmal atrial fibrillation, coronary artery disease, hypertension, chronic diastolic heart failure, aortic stenosis status post TAVR, chronic kidney disease stage IV, type 2 diabetes mellitus,hx of mitral valve endocarditisand obesity class III.   Patient had a recent hospitalization, she was discharged on carvedilol and amiodarone. Follow-up as an outpatient with cardiology,hydralazine was added for blood pressure control. She reported collapsing after taking carvedilol and hydralazine, no loss of consciousness. EMS was called, her heart rate was 22, and capillary glucose 40. EKG 34 bpm, normal axis, junctional rhythm, normal QTC, no ST segment changes, negative T wave lead I and aVL, poor R wave progression. Initially her AV nodal drugs were stopped. She was then restarted on amiodarone and had a successful cardioversion converting to NSR. She was discharged on amiodarone 200 mg bid. She will reduce this to 200 mg daily on 7/30. She is feeling improved.   Today, she denies symptoms of palpitations, chest pain, shortness of breath, orthopnea, PND, lower extremity edema, dizziness, presyncope, syncope, or neurologic sequela. The patient is tolerating medications without difficulties and is otherwise without complaint today.   Past Medical History:  Diagnosis Date  . Atrial fibrillation ([place])    a. maintaining sinus s/p DCCV, on Eliquis  . CAD (coronary artery disease)    a. Nonobstructive by cath 2006 - 25% LAD, 25-30% after 1st diag, distal 25% PDA, minor irreg LCx. b. Normal nuc 2011.  . CKD (chronic kidney disease), stage III   . Cystocele   . Difficult intubation 09-07-2012   big neck trouble with intubation parotid surgery"takes little med to sedate"  . Esophageal stricture   . GERD (gastroesophageal reflux  disease)   . Hiatal hernia    a. 07/2013: HH with small stricture holding up barium tablet (concides with patient's sx of food sticking).  . History of kidney stones   . Hyperlipidemia   . Hypertension   . IBS (irritable bowel syndrome)   . Memory difficulty 11/26/2015  . Morbid obesity ([place])    a. hx of gastric bypass (sleeve gastrectomy 2015)  . Non-STEMI (non-ST elevated myocardial infarction) ([place]) 01/16/2020  . Osteoporosis   . 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  Marland Kitchen Pericardial effusion    a. Mod by echo 2012 at time of PNA. b. Echo 07/2012: small pericardial effusion vs fat.  . Pressure ulcer 04/16/2019   right foot  . S/P TAVR (transcatheter aortic valve replacement)   . Severe aortic stenosis   . Sleep apnea     "have mask; don't use it" (02/14/2018)  . TIA (transient ischemic attack) 10/2014; 06/2016   "some memory issues since; daughter says my speech is sometimes different" (02/14/2018)  . Type II diabetes mellitus ([place])    Past Surgical History:  Procedure Laterality Date  . BREATH TEK H PYLORI N/A 07/29/2013   Procedure: [place];  Surgeon: Shann Medal, MD;  Location: [facility];  Service: General;  Laterality: N/A;  . CARDIAC CATHETERIZATION  2006  . CARDIOVERSION N/A 02/16/2018   Procedure: CARDIOVERSION;  Surgeon: Pixie Casino, MD;  Location: [facility] ENDOSCOPY;  Service: Cardiovascular;  Laterality: N/A;  . CARDIOVERSION N/A 02/25/2020   Procedure: CARDIOVERSION;  Surgeon: Larey Dresser, MD;  Location: [place];  Service: Cardiovascular;  Laterality: N/A;  . CARPAL TUNNEL  RELEASE Left 2011  . CATARACT EXTRACTION W/ INTRAOCULAR LENS  IMPLANT, BILATERAL Bilateral 2003   left  . CHOLECYSTECTOMY OPEN  1982  . COLONOSCOPY N/A 07/14/2015   Procedure: COLONOSCOPY;  Surgeon: Ladene Artist, MD;  Location: [facility];  Service: Endoscopy;  Laterality: N/A;  . ESOPHAGOGASTRODUODENOSCOPY N/A 12/27/2013   Procedure:  ESOPHAGOGASTRODUODENOSCOPY (EGD);  Surgeon: Shann Medal, MD;  Location: [facility];  Service: General;  Laterality: N/A;  . ESOPHAGOGASTRODUODENOSCOPY (EGD) WITH ESOPHAGEAL DILATION    . INTRAOPERATIVE TRANSTHORACIC ECHOCARDIOGRAM  05/08/2018   Procedure: INTRAOPERATIVE TRANSTHORACIC ECHOCARDIOGRAM;  Surgeon: Burnell Blanks, MD;  Location: [place];  Service: Open Heart Surgery;;  . IR FLUORO GUIDE CV LINE RIGHT  04/25/2019  . IR REMOVAL TUN CV CATH W/O FL  06/20/2019  . IR US GUIDE VASC ACCESS RIGHT  04/25/2019  . LAPAROSCOPIC GASTRIC SLEEVE RESECTION N/A 01/20/2014   Procedure: LAPAROSCOPIC GASTRIC SLEEVE RESECTION;  Surgeon: Shann Medal, MD;  Location: [facility];  Service: General;  Laterality: N/A;  . PAROTIDECTOMY  09/07/2012   Procedure: PAROTIDECTOMY;  Surgeon: Melida Quitter, MD;  Location: [place];  Service: ENT;  Laterality: Left;  LEFT PAROTIDECTOMY  . RIGHT HEART CATH N/A 09/25/2018   Procedure: RIGHT HEART CATH;  Surgeon: Larey Dresser, MD;  Location: [place] [facility];  Service: Cardiovascular;  Laterality: N/A;  . RIGHT/LEFT HEART CATH AND CORONARY ANGIOGRAPHY N/A 03/29/2018   Procedure: RIGHT/LEFT HEART CATH AND CORONARY ANGIOGRAPHY;  Surgeon: Burnell Blanks, MD;  Location: [place] [facility];  Service: Cardiovascular;  Laterality: N/A;  . TEE WITHOUT CARDIOVERSION N/A 02/16/2018   Procedure: TRANSESOPHAGEAL ECHOCARDIOGRAM (TEE);  Surgeon: Pixie Casino, MD;  Location: [place];  Service: Cardiovascular;  Laterality: N/A;  . TEE WITHOUT CARDIOVERSION N/A 06/05/2019   Procedure: TRANSESOPHAGEAL ECHOCARDIOGRAM (TEE);  Surgeon: Sanda Klein, MD;  Location: [place];  Service: Cardiovascular;  Laterality: N/A;  . TEE WITHOUT CARDIOVERSION N/A 04/24/2019   Procedure: TRANSESOPHAGEAL ECHOCARDIOGRAM (TEE);  Surgeon: Sanda Klein, MD;  Location: [place];  Service: Cardiovascular;  Laterality: N/A;  . TEE WITHOUT CARDIOVERSION N/A 02/25/2020   Procedure:  TRANSESOPHAGEAL ECHOCARDIOGRAM (TEE);  Surgeon: Larey Dresser, MD;  Location: [facility] ENDOSCOPY;  Service: Cardiovascular;  Laterality: N/A;  . TOE AMPUTATION Left 07/2017   great toe; [facility]   . TONSILLECTOMY  1968  . TRANSCATHETER AORTIC VALVE REPLACEMENT, TRANSFEMORAL  05/08/2018   Transcatheter Aortic Valve Replacement   . TRANSCATHETER AORTIC VALVE REPLACEMENT, TRANSFEMORAL N/A 05/08/2018   Procedure: TRANSCATHETER AORTIC VALVE REPLACEMENT, TRANSFEMORAL;  Surgeon: Burnell Blanks, MD;  Location: [place];  Service: Open Heart Surgery;  Laterality: N/A;  . TUBAL LIGATION  yrs ago  . UPPER GI ENDOSCOPY  01/20/2014   Procedure: UPPER GI ENDOSCOPY;  Surgeon: Shann Medal, MD;  Location: [facility];  Service: General;;    Current Outpatient Medications  Medication Sig Dispense Refill  . ACCU-CHEK AVIVA PLUS test strip USE TO CHECK BLOOD SUGAR UP TO TWICE DAILY OR AS INSTRUCTED 100 each 2  . amiodarone (PACERONE) 200 MG tablet Take 1 tablet (200 mg) twice daily for 2 weeks then take 1 tablet (200 mg) once daily 60 tablet 0  . B-D INS SYR ULTRAFINE 1CC/31G 31G X 5/16" 1 ML MISC USE TO INJECT INSULIN TWICE A DAY AS INSTRUCTED (Patient taking differently: 1 Stick by Other route 3 (three) times daily before meals. ) 100 each 2  . Calcium Carbonate (CALCIUM 600 PO) Take 1 tablet by mouth daily.    Marland Kitchen  Cholecalciferol (VITAMIN D3 PO) Take 1 tablet by mouth in the morning and at bedtime.     . Cyanocobalamin (VITAMIN B-12 PO) Take 1 tablet by mouth daily. 2 gummies by mouth daily     . ELIQUIS 5 MG TABS tablet TAKE 1 TABLET BY MOUTH TWICE A DAY (Patient taking differently: Take 5 mg by mouth 2 (two) times daily. ) 180 tablet 1  . fenofibrate (TRICOR) 145 MG tablet TAKE 1 TABLET BY MOUTH EVERY DAY (Patient taking differently: Take 145 mg by mouth daily. ) 90 tablet 3  . hydrALAZINE (APRESOLINE) 50 MG tablet Take 1 tablet (50 mg total) by mouth every 8 (eight) hours. 90 tablet 0  . insulin NPH Human  (NOVOLIN N) 100 UNIT/ML injection Inject 20-60 Units into the skin 2 (two) times daily before a meal. 60 units in the am 60 units at lunch and 20 units for supper - on a sliding scale    . Insulin Pen Needle 32G X 4 MM MISC Use to inject insulin with insulin pen 100 each 4  . LIVALO 4 MG TABS Take 4 mg by mouth every evening.    . meclizine (ANTIVERT) 12.5 MG tablet Take 1 tablet (12.5 mg total) by mouth 3 (three) times daily as needed for dizziness. 30 tablet 5  . Multiple Vitamins-Minerals (MULTIVITAMIN GUMMIES ADULT PO) Taking two gummies by mouth daily    . NOVOLIN R RELION 100 UNIT/ML injection Inject 20-30 Units into the skin 3 (three) times daily before meals. Per sliding scale    . nutrition supplement, JUVEN, (JUVEN) PACK Take 1 packet by mouth 2 (two) times daily between meals. 60 packet 0  . ondansetron (ZOFRAN ODT) 4 MG disintegrating tablet Take 1 tablet (4 mg total) by mouth every 8 (eight) hours as needed for nausea. 10 tablet 0  . PRALUENT 75 MG/ML SOAJ INJECT 75 MG INTO THE SKIN EVERY 14 (FOURTEEN) DAYS. 2 pen 6  . RELION INSULIN SYRINGE 31G X 15/64" 1 ML MISC Inject 1 Syringe into the skin 3 (three) times daily.    . Tafamidis (VYNDAMAX) 61 MG CAPS Take 61 mg by mouth daily. 30 capsule 11  . torsemide (DEMADEX) 20 MG tablet Take 1 tablet (20 mg total) by mouth 2 (two) times daily. 60 tablet 0  . VASCEPA 1 g capsule TAKE 2 CAPSULES (2 G TOTAL) BY MOUTH 2 (TWO) TIMES A DAY. (Patient taking differently: Take 2 g by mouth 2 (two) times daily. ) 120 capsule 6  . loratadine (CLARITIN) 10 MG tablet Take 10 mg by mouth daily. (Patient not taking: Reported on 03/02/2020)     No current facility-administered medications for this encounter.    Allergies  Allergen Reactions  . Amlodipine Swelling    Ankle swelling  . Atorvastatin Other (See Comments)    Myalgias   . Crestor [Rosuvastatin Calcium] Other (See Comments)    Stiffness and back pain  . Ezetimibe-Simvastatin Other (See  Comments)    Leg cramps  . Sitagliptin Other (See Comments)  . Statins Other (See Comments)    Myalgias  . Celebrex [Celecoxib] Rash  . Levemir [Insulin Detemir] Rash    Social History   Socioeconomic History  . Marital status: Married    Spouse name: Not on file  . Number of children: 3  . Years of education: some college  . Highest education level: Some college, no degree  Occupational History  . Occupation: retired    Fish farm manager: [employer]  Tobacco Use  .  Smoking status: Never Smoker  . Smokeless tobacco: Never Used  Vaping Use  . Vaping Use: Never used  Substance and Sexual Activity  . Alcohol use: Yes    Alcohol/week: 0.0 standard drinks    Comment:  "mixed drink q few years"  . Drug use: Never  . Sexual activity: Yes    Birth control/protection: Post-menopausal  Other Topics Concern  . Not on file  Social History Narrative   Married   Patient is right handed.   Patient rarely drinks caffeine.   Lives at home with husband in two story home. Husband washes laundry in the basement. They have 3 daughters and 7 grandchildren.    Social Determinants of Health   Financial Resource Strain:   . Difficulty of Paying Living Expenses:   Food Insecurity:   . Worried About Charity fundraiser in the Last Year:   . Arboriculturist in the Last Year:   Transportation Needs:   . Film/video editor (Medical):   Marland Kitchen Lack of Transportation (Non-Medical):   Physical Activity:   . Days of Exercise per Week:   . Minutes of Exercise per Session:   Stress:   . Feeling of Stress :   Social Connections:   . Frequency of Communication with Friends and Family:   . Frequency of Social Gatherings with Friends and Family:   . Attends Religious Services:   . Active Member of Clubs or Organizations:   . Attends Archivist Meetings:   Marland Kitchen Marital Status:   Intimate Partner Violence:   . Fear of Current or Ex-Partner:   . Emotionally Abused:   Marland Kitchen Physically Abused:   .  Sexually Abused:     Family History  Problem Relation Age of Onset  . Heart failure Mother   . Hypertension Mother   . Skin cancer Mother   . Colitis Mother   . Diabetes Mother   . Kidney Stones Mother   . Stroke Mother   . Heart disease Father   . Colon polyps Father   . Diabetes Father   . Heart failure Father   . Heart attack Sister   . Diabetes Other   . Stroke Maternal Grandfather   . Diabetes Maternal Grandfather   . Colon cancer Neg Hx     ROS- All systems are reviewed and negative except as per the HPI above  Physical Exam: There were no vitals filed for this visit. Wt Readings from Last 3 Encounters:  02/27/20 91.7 kg  01/24/20 94.6 kg  01/17/20 93.3 kg    Labs: Lab Results  Component Value Date   NA 143 02/27/2020   K 3.3 (L) 02/27/2020   CL 103 02/27/2020   CO2 27 02/27/2020   GLUCOSE 117 (H) 02/27/2020   BUN 61 (H) 02/27/2020   CREATININE 2.69 (H) 02/27/2020   CALCIUM 8.9 02/27/2020   PHOS 4.2 04/23/2019   MG 2.0 02/27/2020   Lab Results  Component Value Date   INR 1.8 (H) 02/19/2020   Lab Results  Component Value Date   CHOL 107 10/08/2019   HDL 36 (L) 10/08/2019   LDLCALC 35 10/08/2019   TRIG 229 (H) 10/08/2019     GEN- The patient is well appearing, alert and oriented x 3 today.   Head- normocephalic, atraumatic Eyes-  Sclera clear, conjunctiva pink Ears- hearing intact Oropharynx- clear Neck- supple, no JVP Lymph- no cervical lymphadenopathy Lungs- Clear to ausculation bilaterally, normal work of breathing Heart- Regular rate  and rhythm, no murmurs, rubs or gallops, PMI not laterally displaced GI- soft, NT, ND, + BS Extremities- no clubbing, cyanosis, or edema MS- no significant deformity or atrophy Skin- no rash or lesion Psych- euthymic mood, full affect Neuro- strength and sensation are intact  EKG-NSR at 76 bpm, pr int 162 ms, qrs int 100 ms, qtc 445 ms    Assessment and Plan: 1. Afib Profound  bradycardia on  amiodarone and carvedilol  on presentation to  [facility] Carvedilol was stopped and amiodarone was restarted, successful cardioversion  and discharged at 200 mg bid for 2 weeks then 200 mg daily on 7/30  2. CHA2DS2VASc score of 8 Continue eliquis 5 mg bid   Reminded not to  miss doses especially after a cardioversion  3. Acute on chronic diastolic HF, amyloid cardiomyopathy , s/p TAVR Looks normovolemic  today Dose increased to torsemide 20 mg bid bmet today as requested on d/c summary   4. HTN Stable   F/u with Dr. Aundra Dubin  8/9   Butch Penny C. Tip Atienza, [facility] [address] [place], [place] [address] [phone]

## 2020-03-02 NOTE — Patient Instructions (Signed)
Your physician has recommended you make the following change in your medication:  -- DECREASE your Amiodarone to 1 tablet (200 mg) daily on March 13, 2020

## 2020-03-03 ENCOUNTER — Other Ambulatory Visit: Payer: Self-pay | Admitting: Family Medicine

## 2020-03-03 ENCOUNTER — Ambulatory Visit (INDEPENDENT_AMBULATORY_CARE_PROVIDER_SITE_OTHER): Payer: Medicare Other | Admitting: Nurse Practitioner

## 2020-03-03 ENCOUNTER — Encounter: Payer: Self-pay | Admitting: Nurse Practitioner

## 2020-03-03 VITALS — BP 152/57 | HR 75 | Temp 97.2°F | Ht 67.0 in | Wt 200.0 lb

## 2020-03-03 DIAGNOSIS — R001 Bradycardia, unspecified: Secondary | ICD-10-CM | POA: Diagnosis not present

## 2020-03-03 DIAGNOSIS — G4733 Obstructive sleep apnea (adult) (pediatric): Secondary | ICD-10-CM | POA: Diagnosis not present

## 2020-03-03 DIAGNOSIS — N179 Acute kidney failure, unspecified: Secondary | ICD-10-CM | POA: Diagnosis not present

## 2020-03-03 DIAGNOSIS — K589 Irritable bowel syndrome without diarrhea: Secondary | ICD-10-CM | POA: Diagnosis not present

## 2020-03-03 DIAGNOSIS — I700 Atherosclerosis of aorta: Secondary | ICD-10-CM | POA: Diagnosis not present

## 2020-03-03 DIAGNOSIS — Z09 Encounter for follow-up examination after completed treatment for conditions other than malignant neoplasm: Secondary | ICD-10-CM

## 2020-03-03 DIAGNOSIS — K222 Esophageal obstruction: Secondary | ICD-10-CM | POA: Diagnosis not present

## 2020-03-03 DIAGNOSIS — N184 Chronic kidney disease, stage 4 (severe): Secondary | ICD-10-CM | POA: Diagnosis not present

## 2020-03-03 DIAGNOSIS — I5033 Acute on chronic diastolic (congestive) heart failure: Secondary | ICD-10-CM | POA: Diagnosis not present

## 2020-03-03 DIAGNOSIS — I130 Hypertensive heart and chronic kidney disease with heart failure and stage 1 through stage 4 chronic kidney disease, or unspecified chronic kidney disease: Secondary | ICD-10-CM | POA: Diagnosis not present

## 2020-03-03 DIAGNOSIS — E1151 Type 2 diabetes mellitus with diabetic peripheral angiopathy without gangrene: Secondary | ICD-10-CM | POA: Diagnosis not present

## 2020-03-03 DIAGNOSIS — E1122 Type 2 diabetes mellitus with diabetic chronic kidney disease: Secondary | ICD-10-CM | POA: Diagnosis not present

## 2020-03-03 DIAGNOSIS — E1149 Type 2 diabetes mellitus with other diabetic neurological complication: Secondary | ICD-10-CM | POA: Diagnosis not present

## 2020-03-03 DIAGNOSIS — I43 Cardiomyopathy in diseases classified elsewhere: Secondary | ICD-10-CM | POA: Diagnosis not present

## 2020-03-03 DIAGNOSIS — I480 Paroxysmal atrial fibrillation: Secondary | ICD-10-CM

## 2020-03-03 DIAGNOSIS — N8110 Cystocele, unspecified: Secondary | ICD-10-CM | POA: Diagnosis not present

## 2020-03-03 DIAGNOSIS — M810 Age-related osteoporosis without current pathological fracture: Secondary | ICD-10-CM | POA: Diagnosis not present

## 2020-03-03 DIAGNOSIS — E872 Acidosis: Secondary | ICD-10-CM | POA: Diagnosis not present

## 2020-03-03 DIAGNOSIS — I4430 Unspecified atrioventricular block: Secondary | ICD-10-CM | POA: Diagnosis not present

## 2020-03-03 DIAGNOSIS — I70202 Unspecified atherosclerosis of native arteries of extremities, left leg: Secondary | ICD-10-CM | POA: Diagnosis not present

## 2020-03-03 DIAGNOSIS — E854 Organ-limited amyloidosis: Secondary | ICD-10-CM | POA: Diagnosis not present

## 2020-03-03 DIAGNOSIS — I252 Old myocardial infarction: Secondary | ICD-10-CM | POA: Diagnosis not present

## 2020-03-03 DIAGNOSIS — E876 Hypokalemia: Secondary | ICD-10-CM | POA: Diagnosis not present

## 2020-03-03 DIAGNOSIS — I2510 Atherosclerotic heart disease of native coronary artery without angina pectoris: Secondary | ICD-10-CM | POA: Diagnosis not present

## 2020-03-03 DIAGNOSIS — K449 Diaphragmatic hernia without obstruction or gangrene: Secondary | ICD-10-CM | POA: Diagnosis not present

## 2020-03-03 DIAGNOSIS — I672 Cerebral atherosclerosis: Secondary | ICD-10-CM | POA: Diagnosis not present

## 2020-03-03 DIAGNOSIS — E11649 Type 2 diabetes mellitus with hypoglycemia without coma: Secondary | ICD-10-CM | POA: Diagnosis not present

## 2020-03-03 MED FILL — VYNDAMAX 61 MG CAPS: 61 | 30 days supply | Qty: 30 | Fill #6

## 2020-03-03 NOTE — Assessment & Plan Note (Signed)
Patient is a 69 year old female who presents today for hospital discharge follow-up.  Patient was admitted on 02/27/2020 for bradycardia.  Patient was discharged from [facility] with heart rate of 30, All hospital discharge instructions completed and reviewed with patient.  Patient verbalized understanding.  Patient is stable all symptoms resolved.  Lab draws done in cardiology office yesterday, GFR 16 and 18, will repeat renal blood work a week from today. Education provided printed handouts given to patient.

## 2020-03-03 NOTE — Assessment & Plan Note (Signed)
Bradycardia is well controlled today, patient is stable with a heart rate of 75.  Provided education to patient with printed handout.  Advised patient to watch out for medication administration. Follow-up as needed.

## 2020-03-03 NOTE — Patient Instructions (Addendum)
Bradycardia Bradycardia is well controlled today, patient is stable with a heart rate of 75.  Provided education to patient with printed handout.  Advised patient to watch out for medication administration. Follow-up as needed.  Hospital discharge follow-up Patient is a 69 year old female who presents today for hospital discharge follow-up.  Patient was admitted on 02/27/2020 for bradycardia.  Patient was discharged from [facility] with heart rate of 30, All hospital discharge instructions completed and reviewed with patient.  Patient verbalized understanding.  Patient is stable all symptoms resolved.  Lab draws done in cardiology office yesterday, GFR 16 and 18, will repeat renal blood work a week from today. Education provided printed handouts given to patient.    Bradycardia, Adult Bradycardia is a slower-than-normal heartbeat. A normal resting heart rate for an adult ranges from 60 to 100 beats per minute. With bradycardia, the resting heart rate is less than 60 beats per minute. Bradycardia can prevent enough oxygen from reaching certain areas of your body when you are active. It can be serious if it keeps enough oxygen from reaching your brain and other parts of your body. Bradycardia is not a problem for everyone. For some healthy adults, a slow resting heart rate is normal. What are the causes? This condition may be caused by:  A problem with the heart, including: ? A problem with the heart's electrical system, such as a heart block. With a heart block, electrical signals between the chambers of the heart are partially or completely blocked, so they are not able to work as they should. ? A problem with the heart's natural pacemaker (sinus node). ? Heart disease. ? A heart attack. ? Heart damage. ? Lyme disease. ? A heart infection. ? A heart condition that is present at birth (congenital heart defect).  Certain medicines that treat heart conditions.  Certain conditions,  such as hypothyroidism and obstructive sleep apnea.  Problems with the balance of chemicals and other substances, like potassium, in the blood.  Trauma.  Radiation therapy. What increases the risk? You are more likely to develop this condition if you:  Are age 44 or older.  Have high blood pressure (hypertension), high cholesterol (hyperlipidemia), or diabetes.  Drink heavily, use tobacco or nicotine products, or use drugs. What are the signs or symptoms? Symptoms of this condition include:  Light-headedness.  Feeling faint or fainting.  Fatigue and weakness.  Trouble with activity or exercise.  Shortness of breath.  Chest pain (angina).  Drowsiness.  Confusion.  Dizziness. How is this diagnosed? This condition may be diagnosed based on:  Your symptoms.  Your medical history.  A physical exam. During the exam, your health care provider will listen to your heartbeat and check your pulse. To confirm the diagnosis, your health care provider may order tests, such as:  Blood tests.  An electrocardiogram (ECG). This test records the heart's electrical activity. The test can show how fast your heart is beating and whether the heartbeat is steady.  A test in which you wear a portable device (event recorder or Holter monitor) to record your heart's electrical activity while you go about your day.  Anexercise test. How is this treated? Treatment for this condition depends on the cause of the condition and how severe your symptoms are. Treatment may involve:  Treatment of the underlying condition.  Changing your medicines or how much medicine you take.  Having a small, battery-operated device called a pacemaker implanted under the skin. When bradycardia occurs, this device can  be used to increase your heart rate and help your heart beat in a regular rhythm. Follow these instructions at home: Lifestyle   Manage any health conditions that contribute to bradycardia  as told by your health care provider.  Follow a heart-healthy diet. A nutrition specialist (dietitian) can help educate you about healthy food options and changes.  Follow an exercise program that is approved by your health care provider.  Maintain a healthy weight.  Try to reduce or manage your stress, such as with yoga or meditation. If you need help reducing stress, ask your health care provider.  Do not use any products that contain nicotine or tobacco, such as cigarettes, e-cigarettes, and chewing tobacco. If you need help quitting, ask your health care provider.  Do not use illegal drugs.  Limit alcohol intake to no more than 1 drink a day for nonpregnant women and 2 drinks a day for men. Be aware of how much alcohol is in your drink. In the [place] one drink equals one 12 oz bottle of beer (355 mL), one 5 oz glass of wine (148 mL), or one 1 oz glass of hard liquor (44 mL). General instructions  Take over-the-counter and prescription medicines only as told by your health care provider.  Keep all follow-up visits as told by your health care provider. This is important. How is this prevented? In some cases, bradycardia may be prevented by:  Treating underlying medical problems.  Stopping behaviors or medicines that can trigger the condition. Contact a health care provider if you:  Feel light-headed or dizzy.  Almost faint.  Feel weak or are easily fatigued during physical activity.  Experience confusion or have memory problems. Get help right away if:  You faint.  You have: ? An irregular heartbeat (palpitations). ? Chest pain. ? Trouble breathing. Summary  Bradycardia is a slower-than-normal heartbeat. With bradycardia, the resting heart rate is less than 60 beats per minute.  Treatment for this condition depends on the cause.  Manage any health conditions that contribute to bradycardia as told by your health care provider.  Do not use any products that contain  nicotine or tobacco, such as cigarettes, e-cigarettes, and chewing tobacco, and limit alcohol intake.  Keep all follow-up visits as told by your health care provider. This is important. This information is not intended to replace advice given to you by your health care provider. Make sure you discuss any questions you have with your health care provider. Document Revised: 02/12/2018 Document Reviewed: 01/10/2018 Elsevier Patient Education  [place].

## 2020-03-03 NOTE — Progress Notes (Addendum)
Established Patient Office Visit  Subjective:  Patient ID: [name], female    DOB: [birth year]  Age: 69 y.o. MRN: [id number]  CC:  Chief Complaint  Patient presents with  . Hospitalization Follow-up    [facility] on 7/7, saw [facility] provider yesterday    HPI [name] presents for transition of care management  Today's visit was for Transitional Care Management.  The patient was discharged from [facility] on 02/27/20 with a primary diagnosis of Bradycardia  Contact with the patient and/or caregiver, by a clinical staff member, was made on 02/27/20 and was documented as a telephone encounter within the EMR.  Through chart review and discussion with the patient I have determined that management of their condition is of high complexity.       Past Medical History:  Diagnosis Date  . Atrial fibrillation ([place])    a. maintaining sinus s/p DCCV, on Eliquis  . CAD (coronary artery disease)    a. Nonobstructive by cath 2006 - 25% LAD, 25-30% after 1st diag, distal 25% PDA, minor irreg LCx. b. Normal nuc 2011.  . CKD (chronic kidney disease), stage III   . Cystocele   . Difficult intubation 09-07-2012   big neck trouble with intubation parotid surgery"takes little med to sedate"  . Esophageal stricture   . GERD (gastroesophageal reflux disease)   . Hiatal hernia    a. 07/2013: HH with small stricture holding up barium tablet (concides with patient's sx of food sticking).  . History of kidney stones   . Hyperlipidemia   . Hypertension   . IBS (irritable bowel syndrome)   . Memory difficulty 11/26/2015  . Morbid obesity ([place])    a. hx of gastric bypass (sleeve gastrectomy 2015)  . Non-STEMI (non-ST elevated myocardial infarction) ([place]) 01/16/2020  . Osteoporosis   . 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  Marland Kitchen Pericardial effusion    a. Mod by echo 2012 at time of PNA. b. Echo 07/2012: small pericardial effusion vs fat.  . Pressure ulcer 04/16/2019    right foot  . S/P TAVR (transcatheter aortic valve replacement)   . Severe aortic stenosis   . Sleep apnea     "have mask; don't use it" (02/14/2018)  . TIA (transient ischemic attack) 10/2014; 06/2016   "some memory issues since; daughter says my speech is sometimes different" (02/14/2018)  . Type II diabetes mellitus ([place])     Past Surgical History:  Procedure Laterality Date  . BREATH TEK H PYLORI N/A 07/29/2013   Procedure: [place];  Surgeon: Shann Medal, MD;  Location: [facility];  Service: General;  Laterality: N/A;  . CARDIAC CATHETERIZATION  2006  . CARDIOVERSION N/A 02/16/2018   Procedure: CARDIOVERSION;  Surgeon: Pixie Casino, MD;  Location: [facility] ENDOSCOPY;  Service: Cardiovascular;  Laterality: N/A;  . CARDIOVERSION N/A 02/25/2020   Procedure: CARDIOVERSION;  Surgeon: Larey Dresser, MD;  Location: [place];  Service: Cardiovascular;  Laterality: N/A;  . CARPAL TUNNEL RELEASE Left 2011  . CATARACT EXTRACTION W/ INTRAOCULAR LENS  IMPLANT, BILATERAL Bilateral 2003   left  . CHOLECYSTECTOMY OPEN  1982  . COLONOSCOPY N/A 07/14/2015   Procedure: COLONOSCOPY;  Surgeon: Ladene Artist, MD;  Location: [facility] ENDOSCOPY;  Service: Endoscopy;  Laterality: N/A;  . ESOPHAGOGASTRODUODENOSCOPY N/A 12/27/2013   Procedure: ESOPHAGOGASTRODUODENOSCOPY (EGD);  Surgeon: Shann Medal, MD;  Location: [facility];  Service: General;  Laterality: N/A;  . ESOPHAGOGASTRODUODENOSCOPY (EGD)  WITH ESOPHAGEAL DILATION    . INTRAOPERATIVE TRANSTHORACIC ECHOCARDIOGRAM  05/08/2018   Procedure: INTRAOPERATIVE TRANSTHORACIC ECHOCARDIOGRAM;  Surgeon: Burnell Blanks, MD;  Location: [place];  Service: Open Heart Surgery;;  . IR FLUORO GUIDE CV LINE RIGHT  04/25/2019  . IR REMOVAL TUN CV CATH W/O FL  06/20/2019  . IR US GUIDE VASC ACCESS RIGHT  04/25/2019  . LAPAROSCOPIC GASTRIC SLEEVE RESECTION N/A 01/20/2014   Procedure: LAPAROSCOPIC GASTRIC SLEEVE RESECTION;  Surgeon: Shann Medal, MD;   Location: [facility];  Service: General;  Laterality: N/A;  . PAROTIDECTOMY  09/07/2012   Procedure: PAROTIDECTOMY;  Surgeon: Melida Quitter, MD;  Location: [place];  Service: ENT;  Laterality: Left;  LEFT PAROTIDECTOMY  . RIGHT HEART CATH N/A 09/25/2018   Procedure: RIGHT HEART CATH;  Surgeon: Larey Dresser, MD;  Location: [place] CV LAB;  Service: Cardiovascular;  Laterality: N/A;  . RIGHT/LEFT HEART CATH AND CORONARY ANGIOGRAPHY N/A 03/29/2018   Procedure: RIGHT/LEFT HEART CATH AND CORONARY ANGIOGRAPHY;  Surgeon: Burnell Blanks, MD;  Location: [place] CV LAB;  Service: Cardiovascular;  Laterality: N/A;  . TEE WITHOUT CARDIOVERSION N/A 02/16/2018   Procedure: TRANSESOPHAGEAL ECHOCARDIOGRAM (TEE);  Surgeon: Pixie Casino, MD;  Location: [place];  Service: Cardiovascular;  Laterality: N/A;  . TEE WITHOUT CARDIOVERSION N/A 06/05/2019   Procedure: TRANSESOPHAGEAL ECHOCARDIOGRAM (TEE);  Surgeon: Sanda Klein, MD;  Location: [place];  Service: Cardiovascular;  Laterality: N/A;  . TEE WITHOUT CARDIOVERSION N/A 04/24/2019   Procedure: TRANSESOPHAGEAL ECHOCARDIOGRAM (TEE);  Surgeon: Sanda Klein, MD;  Location: [place];  Service: Cardiovascular;  Laterality: N/A;  . TEE WITHOUT CARDIOVERSION N/A 02/25/2020   Procedure: TRANSESOPHAGEAL ECHOCARDIOGRAM (TEE);  Surgeon: Larey Dresser, MD;  Location: [facility] ENDOSCOPY;  Service: Cardiovascular;  Laterality: N/A;  . TOE AMPUTATION Left 07/2017   great toe; [facility]   . TONSILLECTOMY  1968  . TRANSCATHETER AORTIC VALVE REPLACEMENT, TRANSFEMORAL  05/08/2018   Transcatheter Aortic Valve Replacement   . TRANSCATHETER AORTIC VALVE REPLACEMENT, TRANSFEMORAL N/A 05/08/2018   Procedure: TRANSCATHETER AORTIC VALVE REPLACEMENT, TRANSFEMORAL;  Surgeon: Burnell Blanks, MD;  Location: [place];  Service: Open Heart Surgery;  Laterality: N/A;  . TUBAL LIGATION  yrs ago  . UPPER GI ENDOSCOPY  01/20/2014   Procedure: UPPER GI ENDOSCOPY;   Surgeon: Shann Medal, MD;  Location: [facility];  Service: General;;    Family History  Problem Relation Age of Onset  . Heart failure Mother   . Hypertension Mother   . Skin cancer Mother   . Colitis Mother   . Diabetes Mother   . Kidney Stones Mother   . Stroke Mother   . Heart disease Father   . Colon polyps Father   . Diabetes Father   . Heart failure Father   . Heart attack Sister   . Diabetes Other   . Stroke Maternal Grandfather   . Diabetes Maternal Grandfather   . Colon cancer Neg Hx     Social History   Socioeconomic History  . Marital status: Married    Spouse name: Not on file  . Number of children: 3  . Years of education: some college  . Highest education level: Some college, no degree  Occupational History  . Occupation: retired    Fish farm manager: [employer]  Tobacco Use  . Smoking status: Never Smoker  . Smokeless tobacco: Never Used  Vaping Use  . Vaping Use: Never used  Substance and Sexual Activity  . Alcohol use: Yes  Alcohol/week: 0.0 standard drinks    Comment:  "mixed drink q few years"  . Drug use: Never  . Sexual activity: Yes    Birth control/protection: Post-menopausal  Other Topics Concern  . Not on file  Social History Narrative   Married   Patient is right handed.   Patient rarely drinks caffeine.   Lives at home with husband in two story home. Husband washes laundry in the basement. They have 3 daughters and 7 grandchildren.    Social Determinants of Health   Financial Resource Strain:   . Difficulty of Paying Living Expenses:   Food Insecurity:   . Worried About Charity fundraiser in the Last Year:   . Arboriculturist in the Last Year:   Transportation Needs:   . Film/video editor (Medical):   Marland Kitchen Lack of Transportation (Non-Medical):   Physical Activity:   . Days of Exercise per Week:   . Minutes of Exercise per Session:   Stress:   . Feeling of Stress :   Social Connections:   . Frequency of Communication with  Friends and Family:   . Frequency of Social Gatherings with Friends and Family:   . Attends Religious Services:   . Active Member of Clubs or Organizations:   . Attends Archivist Meetings:   Marland Kitchen Marital Status:   Intimate Partner Violence:   . Fear of Current or Ex-Partner:   . Emotionally Abused:   Marland Kitchen Physically Abused:   . Sexually Abused:     Outpatient Medications Prior to Visit  Medication Sig Dispense Refill  . ACCU-CHEK AVIVA PLUS test strip USE TO CHECK BLOOD SUGAR UP TO TWICE DAILY OR AS INSTRUCTED 100 each 2  . amiodarone (PACERONE) 200 MG tablet Take 1 tablet (200 mg) twice daily for 2 weeks then take 1 tablet (200 mg) once daily 60 tablet 0  . B-D INS SYR ULTRAFINE 1CC/31G 31G X 5/16" 1 ML MISC USE TO INJECT INSULIN TWICE A DAY AS INSTRUCTED (Patient taking differently: 1 Stick by Other route 3 (three) times daily before meals. ) 100 each 2  . Calcium Carbonate (CALCIUM 600 PO) Take 1 tablet by mouth daily.    . Cholecalciferol (VITAMIN D3 PO) Take 1 tablet by mouth in the morning and at bedtime.     . Cyanocobalamin (VITAMIN B-12 PO) Take 1 tablet by mouth daily. 2 gummies by mouth daily     . ELIQUIS 5 MG TABS tablet TAKE 1 TABLET BY MOUTH TWICE A DAY (Patient taking differently: Take 5 mg by mouth 2 (two) times daily. ) 180 tablet 1  . fenofibrate (TRICOR) 145 MG tablet TAKE 1 TABLET BY MOUTH EVERY DAY (Patient taking differently: Take 145 mg by mouth daily. ) 90 tablet 3  . hydrALAZINE (APRESOLINE) 50 MG tablet Take 1 tablet (50 mg total) by mouth every 8 (eight) hours. 90 tablet 0  . insulin NPH Human (NOVOLIN N) 100 UNIT/ML injection Inject 20-60 Units into the skin 2 (two) times daily before a meal. 60 units in the am 60 units at lunch and 20 units for supper - on a sliding scale    . Insulin Pen Needle 32G X 4 MM MISC Use to inject insulin with insulin pen 100 each 4  . LIVALO 4 MG TABS Take 4 mg by mouth every evening.    . loratadine (CLARITIN) 10 MG tablet  Take 10 mg by mouth daily.     . meclizine (ANTIVERT) 12.5  MG tablet Take 1 tablet (12.5 mg total) by mouth 3 (three) times daily as needed for dizziness. 30 tablet 5  . Multiple Vitamins-Minerals (MULTIVITAMIN GUMMIES ADULT PO) Taking two gummies by mouth daily    . NOVOLIN R RELION 100 UNIT/ML injection Inject 20-30 Units into the skin 3 (three) times daily before meals. Per sliding scale    . nutrition supplement, JUVEN, (JUVEN) PACK Take 1 packet by mouth 2 (two) times daily between meals. 60 packet 0  . ondansetron (ZOFRAN ODT) 4 MG disintegrating tablet Take 1 tablet (4 mg total) by mouth every 8 (eight) hours as needed for nausea. 10 tablet 0  . PRALUENT 75 MG/ML SOAJ INJECT 75 MG INTO THE SKIN EVERY 14 (FOURTEEN) DAYS. 2 pen 6  . RELION INSULIN SYRINGE 31G X 15/64" 1 ML MISC Inject 1 Syringe into the skin 3 (three) times daily.    . Tafamidis (VYNDAMAX) 61 MG CAPS Take 61 mg by mouth daily. 30 capsule 11  . torsemide (DEMADEX) 20 MG tablet Take 1 tablet (20 mg total) by mouth 2 (two) times daily. 60 tablet 0  . VASCEPA 1 g capsule TAKE 2 CAPSULES (2 G TOTAL) BY MOUTH 2 (TWO) TIMES A DAY. (Patient taking differently: Take 2 g by mouth 2 (two) times daily. ) 120 capsule 6   No facility-administered medications prior to visit.    Allergies  Allergen Reactions  . Amlodipine Swelling    Ankle swelling  . Atorvastatin Other (See Comments)    Myalgias   . Crestor [Rosuvastatin Calcium] Other (See Comments)    Stiffness and back pain  . Ezetimibe-Simvastatin Other (See Comments)    Leg cramps  . Sitagliptin Other (See Comments)  . Statins Other (See Comments)    Myalgias  . Celebrex [Celecoxib] Rash  . Levemir [Insulin Detemir] Rash    ROS Review of Systems  Constitutional: Negative.   Respiratory: Negative.   Cardiovascular: Negative for palpitations and leg swelling.  Gastrointestinal: Negative.  Negative for diarrhea and nausea.  Genitourinary: Negative.   Musculoskeletal:  Positive for myalgias.  Skin: Negative for rash.  Neurological: Positive for weakness.  Psychiatric/Behavioral: The patient is not nervous/anxious.       Objective:    Physical Exam Constitutional:      Appearance: She is obese.  HENT:     Head: Normocephalic.  Eyes:     Conjunctiva/sclera: Conjunctivae normal.  Cardiovascular:     Rate and Rhythm: Normal rate and regular rhythm.     Pulses: Normal pulses.     Heart sounds: Normal heart sounds.  Pulmonary:     Effort: Pulmonary effort is normal.     Breath sounds: Normal breath sounds.  Abdominal:     General: Bowel sounds are normal. There is distension.  Musculoskeletal:        General: Tenderness present.  Skin:    General: Skin is warm.     Findings: No erythema or rash.  Neurological:     Mental Status: She is alert and oriented to person, place, and time.  Psychiatric:        Mood and Affect: Mood normal.     BP (!) 152/57   Pulse 75   Temp (!) 97.2 F (36.2 C) (Temporal)   Ht 5\' 7"  (1.702 m)   Wt 200 lb (90.7 kg)   BMI 31.32 kg/m  Wt Readings from Last 3 Encounters:  03/03/20 200 lb (90.7 kg)  03/02/20 199 lb 12.8 oz (90.6 kg)  02/27/20  202 lb 2.6 oz (91.7 kg)     Health Maintenance Due  Topic Date Due  . DEXA SCAN  09/26/2018  . MAMMOGRAM  10/17/2019    There are no preventive care reminders to display for this patient.  Lab Results  Component Value Date   TSH 1.435 02/25/2020   Lab Results  Component Value Date   WBC 6.7 02/25/2020   HGB 10.0 (L) 02/25/2020   HCT 32.8 (L) 02/25/2020   MCV 78.1 (L) 02/25/2020   PLT 266 02/25/2020   Lab Results  Component Value Date   NA 142 03/02/2020   K 4.2 03/02/2020   CO2 31 03/02/2020   GLUCOSE 225 (H) 03/02/2020   BUN 60 (H) 03/02/2020   CREATININE 2.90 (H) 03/02/2020   BILITOT 0.8 01/17/2020   ALKPHOS 34 (L) 01/17/2020   AST 39 01/17/2020   ALT 21 01/17/2020   PROT 5.6 (L) 01/17/2020   ALBUMIN 3.0 (L) 01/17/2020   CALCIUM 9.1  03/02/2020   ANIONGAP 16 (H) 03/02/2020   GFR 55.42 (L) 12/19/2011   Lab Results  Component Value Date   CHOL 107 10/08/2019   Lab Results  Component Value Date   HDL 36 (L) 10/08/2019   Lab Results  Component Value Date   LDLCALC 35 10/08/2019   Lab Results  Component Value Date   TRIG 229 (H) 10/08/2019   Lab Results  Component Value Date   CHOLHDL 3.3 07/22/2019   Lab Results  Component Value Date   HGBA1C 10.2 (H) 01/16/2020      Assessment & Plan:   Problem List Items Addressed This Visit      Other   Hospital discharge follow-up    Patient is a 69 year old female who presents today for hospital discharge follow-up.  Patient was admitted on 02/27/2020 for bradycardia.  Patient was discharged from [facility] with heart rate of 30, All hospital discharge instructions completed and reviewed with patient.  Patient verbalized understanding.  Patient is stable all symptoms resolved.  Lab draws done in cardiology office yesterday, GFR 16 and 18, will repeat renal blood work a week from today. Education provided printed handouts given to patient.      Bradycardia - Primary    Bradycardia is well controlled today, patient is stable with a heart rate of 75.  Provided education to patient with printed handout.  Advised patient to watch out for medication administration. Follow-up as needed.          Follow-up: Return if symptoms worsen or fail to improve.    Ivy Lynn, NP

## 2020-03-04 ENCOUNTER — Other Ambulatory Visit (HOSPITAL_COMMUNITY): Payer: Self-pay

## 2020-03-04 DIAGNOSIS — I480 Paroxysmal atrial fibrillation: Secondary | ICD-10-CM | POA: Diagnosis not present

## 2020-03-04 DIAGNOSIS — I5032 Chronic diastolic (congestive) heart failure: Secondary | ICD-10-CM

## 2020-03-04 DIAGNOSIS — I2510 Atherosclerotic heart disease of native coronary artery without angina pectoris: Secondary | ICD-10-CM | POA: Diagnosis not present

## 2020-03-04 DIAGNOSIS — I5033 Acute on chronic diastolic (congestive) heart failure: Secondary | ICD-10-CM | POA: Diagnosis not present

## 2020-03-04 DIAGNOSIS — I130 Hypertensive heart and chronic kidney disease with heart failure and stage 1 through stage 4 chronic kidney disease, or unspecified chronic kidney disease: Secondary | ICD-10-CM | POA: Diagnosis not present

## 2020-03-04 DIAGNOSIS — I252 Old myocardial infarction: Secondary | ICD-10-CM | POA: Diagnosis not present

## 2020-03-04 DIAGNOSIS — E1122 Type 2 diabetes mellitus with diabetic chronic kidney disease: Secondary | ICD-10-CM | POA: Diagnosis not present

## 2020-03-08 ENCOUNTER — Other Ambulatory Visit: Payer: Self-pay | Admitting: Medical

## 2020-03-10 DIAGNOSIS — I252 Old myocardial infarction: Secondary | ICD-10-CM | POA: Diagnosis not present

## 2020-03-10 DIAGNOSIS — I2510 Atherosclerotic heart disease of native coronary artery without angina pectoris: Secondary | ICD-10-CM | POA: Diagnosis not present

## 2020-03-10 DIAGNOSIS — E1122 Type 2 diabetes mellitus with diabetic chronic kidney disease: Secondary | ICD-10-CM | POA: Diagnosis not present

## 2020-03-10 DIAGNOSIS — I5033 Acute on chronic diastolic (congestive) heart failure: Secondary | ICD-10-CM | POA: Diagnosis not present

## 2020-03-10 DIAGNOSIS — I130 Hypertensive heart and chronic kidney disease with heart failure and stage 1 through stage 4 chronic kidney disease, or unspecified chronic kidney disease: Secondary | ICD-10-CM | POA: Diagnosis not present

## 2020-03-10 DIAGNOSIS — I480 Paroxysmal atrial fibrillation: Secondary | ICD-10-CM | POA: Diagnosis not present

## 2020-03-11 ENCOUNTER — Ambulatory Visit (INDEPENDENT_AMBULATORY_CARE_PROVIDER_SITE_OTHER): Payer: Medicare Other

## 2020-03-11 DIAGNOSIS — Z0000 Encounter for general adult medical examination without abnormal findings: Secondary | ICD-10-CM

## 2020-03-11 NOTE — Progress Notes (Signed)
MEDICARE ANNUAL WELLNESS VISIT  03/11/2020  Telephone Visit Disclaimer This Medicare AWV was conducted by telephone due to national recommendations for restrictions regarding the COVID-19 Pandemic (e.g. social distancing).  I verified, using two identifiers, that I am speaking with [name] or their authorized healthcare agent. I discussed the limitations, risks, security, and privacy concerns of performing an evaluation and management service by telephone and the potential availability of an in-person appointment in the future. The patient expressed understanding and agreed to proceed.   Subjective:  [name] is a 69 y.o. female patient of Alison Norlander, DO who had a Medicare Annual Wellness Visit today via telephone. [name] is Disabled and lives with their spouse. she has three children. she reports that she is not socially active and does interact with friends/family regularly. she is not physically active and enjoys watching her grandchildren play softball.  Patient Care Team: Alison Norlander, DO as PCP - General (Family Medicine) Minus Breeding, MD as PCP - Cardiology (Cardiology) Thompson Grayer, MD as PCP - Electrophysiology (Cardiology) Melida Quitter, MD as Referring Physician (Otolaryngology) Alphonsa Overall, MD as Consulting Physician (General Surgery) Minus Breeding, MD as Consulting Physician (Cardiology) Williams Che, MD (Inactive) as Consulting Physician (Ophthalmology) Woodroe Mode, MD as Consulting Physician (Obstetrics and Gynecology) Ladene Artist, MD as Consulting Physician (Gastroenterology) Manuela Neptune, DPM as Referring Physician (Podiatry) Larey Dresser, MD as Consulting Physician (Cardiology) Minus Breeding, MD as Consulting Physician (Cardiology) Manuela Neptune, DPM as Referring Physician (Podiatry) Larey Dresser, MD as Consulting Physician (Cardiology) Alonza Smoker, MD as Consulting Physician  (Surgery)  Advanced Directives 03/11/2020 02/21/2020 01/16/2020 12/10/2019 06/05/2019 04/16/2019 04/15/2019  Does Patient Have a Medical Advance Directive? No No No No No - No  Type of Advance Directive - - - - (No Data) - -  Would patient like information on creating a medical advance directive? No - Patient declined No - Patient declined No - Patient declined No - Patient declined No - Patient declined No - Patient declined No - Patient declined  Pre-existing out of facility DNR order (yellow form or pink MOST form) - - - - - - -    Hospital Utilization Over the Past 12 Months: # of hospitalizations or ER visits: 3 # of surgeries: 1  Review of Systems    Patient reports that her overall health is worse compared to last year.  Cardiovascular ROS: Bradycardia  Patient Reported Readings (BP, Pulse, CBG-108 this morning and 210 after lunch, Weight, etc)   Pain Assessment Pain : No/denies pain Pain Score: 0-No pain     Current Medications & Allergies (verified) Allergies as of 03/11/2020      Reactions   Amlodipine Swelling   Ankle swelling   Atorvastatin Other (See Comments)   Myalgias   Crestor [rosuvastatin Calcium] Other (See Comments)   Stiffness and back pain   Ezetimibe-simvastatin Other (See Comments)   Leg cramps   Sitagliptin Other (See Comments)   Statins Other (See Comments)   Myalgias   Celebrex [celecoxib] Rash   Levemir [insulin Detemir] Rash      Medication List       Accurate as of March 11, 2020  2:17 PM. If you have any questions, ask your nurse or doctor.        Accu-Chek Aviva Plus test strip Generic drug: glucose blood USE TO CHECK BLOOD SUGAR UP TO TWICE DAILY OR AS INSTRUCTED   amiodarone 200  MG tablet Commonly known as: PACERONE Take 1 tablet (200 mg) twice daily for 2 weeks then take 1 tablet (200 mg) once daily   B-D INS SYR ULTRAFINE 1CC/31G 31G X 5/16" 1 ML Misc Generic drug: Insulin Syringe-Needle U-100 USE TO INJECT INSULIN TWICE A DAY AS  INSTRUCTED What changed: See the new instructions.   ReliOn Insulin Syringe 31G X 15/64" 1 ML Misc Generic drug: Insulin Syringe-Needle U-100 Inject 1 Syringe into the skin 3 (three) times daily. What changed: Another medication with the same name was changed. Make sure you understand how and when to take each.   CALCIUM 600 PO Take 1 tablet by mouth daily.   Eliquis 5 MG Tabs tablet Generic drug: apixaban TAKE 1 TABLET BY MOUTH TWICE A DAY What changed: how much to take   fenofibrate 145 MG tablet Commonly known as: TRICOR TAKE 1 TABLET BY MOUTH EVERY DAY   hydrALAZINE 50 MG tablet Commonly known as: APRESOLINE Take 1 tablet (50 mg total) by mouth every 8 (eight) hours.   insulin NPH Human 100 UNIT/ML injection Commonly known as: NOVOLIN N Inject 20-60 Units into the skin 2 (two) times daily before a meal. 60 units in the am 60 units at lunch and 20 units for supper - on a sliding scale   Insulin Pen Needle 32G X 4 MM Misc Use to inject insulin with insulin pen   Livalo 4 MG Tabs Generic drug: Pitavastatin Calcium Take 4 mg by mouth every evening.   loratadine 10 MG tablet Commonly known as: CLARITIN Take 10 mg by mouth daily.   meclizine 12.5 MG tablet Commonly known as: ANTIVERT Take 1 tablet (12.5 mg total) by mouth 3 (three) times daily as needed for dizziness.   MULTIVITAMIN GUMMIES ADULT PO Taking two gummies by mouth daily   NovoLIN R ReliOn 100 units/mL injection Generic drug: insulin regular Inject 20-30 Units into the skin 3 (three) times daily before meals. Per sliding scale   nutrition supplement (JUVEN) Pack Take 1 packet by mouth 2 (two) times daily between meals.   ondansetron 4 MG disintegrating tablet Commonly known as: Zofran ODT Take 1 tablet (4 mg total) by mouth every 8 (eight) hours as needed for nausea.   Praluent 75 MG/ML Soaj Generic drug: Alirocumab INJECT 75 MG INTO THE SKIN EVERY 14 (FOURTEEN) DAYS.   torsemide 20 MG  tablet Commonly known as: DEMADEX Take 1 tablet (20 mg total) by mouth 2 (two) times daily.   Vascepa 1 g capsule Generic drug: icosapent Ethyl TAKE 2 CAPSULES (2 G TOTAL) BY MOUTH 2 (TWO) TIMES A DAY. What changed: See the new instructions.   VITAMIN B-12 PO Take 1 tablet by mouth daily. 2 gummies by mouth daily   VITAMIN D3 PO Take 1 tablet by mouth in the morning and at bedtime.   Vyndamax 61 MG Caps Generic drug: Tafamidis Take 61 mg by mouth daily.       History (reviewed): Past Medical History:  Diagnosis Date  . Atrial fibrillation ([place])    a. maintaining sinus s/p DCCV, on Eliquis  . CAD (coronary artery disease)    a. Nonobstructive by cath 2006 - 25% LAD, 25-30% after 1st diag, distal 25% PDA, minor irreg LCx. b. Normal nuc 2011.  . CKD (chronic kidney disease), stage III   . Cystocele   . Difficult intubation 09-07-2012   big neck trouble with intubation parotid surgery"takes little med to sedate"  . Esophageal stricture   . GERD (  gastroesophageal reflux disease)   . Hiatal hernia    a. 07/2013: HH with small stricture holding up barium tablet (concides with patient's sx of food sticking).  . History of kidney stones   . Hyperlipidemia   . Hypertension   . IBS (irritable bowel syndrome)   . Memory difficulty 11/26/2015  . Morbid obesity ([place])    a. hx of gastric bypass (sleeve gastrectomy 2015)  . Non-STEMI (non-ST elevated myocardial infarction) ([place]) 01/16/2020  . Osteoporosis   . 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  Marland Kitchen Pericardial effusion    a. Mod by echo 2012 at time of PNA. b. Echo 07/2012: small pericardial effusion vs fat.  . Pressure ulcer 04/16/2019   right foot  . S/P TAVR (transcatheter aortic valve replacement)   . Severe aortic stenosis   . Sleep apnea     "have mask; don't use it" (02/14/2018)  . TIA (transient ischemic attack) 10/2014; 06/2016   "some memory issues since; daughter says my speech is sometimes different"  (02/14/2018)  . Type II diabetes mellitus ([place])    Past Surgical History:  Procedure Laterality Date  . BREATH TEK H PYLORI N/A 07/29/2013   Procedure: [place];  Surgeon: Shann Medal, MD;  Location: [facility];  Service: General;  Laterality: N/A;  . CARDIAC CATHETERIZATION  2006  . CARDIOVERSION N/A 02/16/2018   Procedure: CARDIOVERSION;  Surgeon: Pixie Casino, MD;  Location[facility] END[facility]COPY;  Service: Cardiovascular;  Laterality: N/A;  . CARDIOVERSION N/A 02/25/2020   Procedure: CARDIOVERSION;  Surgeon: Larey Dresser, MD;  Location: [place];  Service: Cardiovascular;  Laterality: N/A;  . CARPAL TUNNEL RELEASE Left 2011  . CATARACT EXTRACTION W/ INTRAOCULAR LENS  IMPLANT, BILATERAL Bilateral 2003   left  . CHOLECYSTECTOMY OPEN  1982  . COLONOSCOPY N/A 07/14/2015   Procedure: COLONOSCOPY;  Surgeon: Ladene Artist, MD;  Location: [facility] ENDOSCOPY;  Service: Endoscopy;  Laterality: N/A;  . ESOPHAGOGASTRODUODENOSCOPY N/A 12/27/2013   Procedure: ESOPHAGOGASTRODUODENOSCOPY (EGD);  Surgeon: Shann Medal, MD;  Location: [facility];  Service: General;  Laterality: N/A;  . ESOPHAGOGASTRODUODENOSCOPY (EGD) WITH ESOPHAGEAL DILATION    . INTRAOPERATIVE TRANSTHORACIC ECHOCARDIOGRAM  05/08/2018   Procedure: INTRAOPERATIVE TRANSTHORACIC ECHOCARDIOGRAM;  Surgeon: Burnell Blanks, MD;  Location: [place];  Service: Open Heart Surgery;;  . IR FLUORO GUIDE CV LINE RIGHT  04/25/2019  . IR REMOVAL TUN CV CATH W/O FL  06/20/2019  . IR US GUIDE VASC ACCESS RIGHT  04/25/2019  . LAPAROSCOPIC GASTRIC SLEEVE RESECTION N/A 01/20/2014   Procedure: LAPAROSCOPIC GASTRIC SLEEVE RESECTION;  Surgeon: Shann Medal, MD;  Location: [facility];  Service: General;  Laterality: N/A;  . PAROTIDECTOMY  09/07/2012   Procedure: PAROTIDECTOMY;  Surgeon: Melida Quitter, MD;  Location: [place];  Service: ENT;  Laterality: Left;  LEFT PAROTIDECTOMY  . RIGHT HEART CATH N/A 09/25/2018   Procedure: RIGHT HEART CATH;   Surgeon: Larey Dresser, MD;  Location: [place] [facility];  Service: Cardiovascular;  Laterality: N/A;  . RIGHT/LEFT HEART CATH AND CORONARY ANGIOGRAPHY N/A 03/29/2018   Procedure: RIGHT/LEFT HEART CATH AND CORONARY ANGIOGRAPHY;  Surgeon: Burnell Blanks, MD;  Location: [place] [facility];  Service: Cardiovascular;  Laterality: N/A;  . TEE WITHOUT CARDIOVERSION N/A 02/16/2018   Procedure: TRANSESOPHAGEAL ECHOCARDIOGRAM (TEE);  Surgeon: Pixie Casino, MD;  Location: [facility] ENDOSCOPY;  Service: Cardiovascular;  Laterality: N/A;  . TEE WITHOUT CARDIOVERSION N/A 06/05/2019   Procedure: TRANSESOPHAGEAL  ECHOCARDIOGRAM (TEE);  Surgeon: Sanda Klein, MD;  Location: [place];  Service: Cardiovascular;  Laterality: N/A;  . TEE WITHOUT CARDIOVERSION N/A 04/24/2019   Procedure: TRANSESOPHAGEAL ECHOCARDIOGRAM (TEE);  Surgeon: Sanda Klein, MD;  Location: [place];  Service: Cardiovascular;  Laterality: N/A;  . TEE WITHOUT CARDIOVERSION N/A 02/25/2020   Procedure: TRANSESOPHAGEAL ECHOCARDIOGRAM (TEE);  Surgeon: Larey Dresser, MD;  Location: [facility] ENDOSCOPY;  Service: Cardiovascular;  Laterality: N/A;  . TOE AMPUTATION Left 07/2017   great toe; [facility]   . TONSILLECTOMY  1968  . TRANSCATHETER AORTIC VALVE REPLACEMENT, TRANSFEMORAL  05/08/2018   Transcatheter Aortic Valve Replacement   . TRANSCATHETER AORTIC VALVE REPLACEMENT, TRANSFEMORAL N/A 05/08/2018   Procedure: TRANSCATHETER AORTIC VALVE REPLACEMENT, TRANSFEMORAL;  Surgeon: Burnell Blanks, MD;  Location: [place];  Service: Open Heart Surgery;  Laterality: N/A;  . TUBAL LIGATION  yrs ago  . UPPER GI ENDOSCOPY  01/20/2014   Procedure: UPPER GI ENDOSCOPY;  Surgeon: Shann Medal, MD;  Location: [facility];  Service: General;;   Family History  Problem Relation Age of Onset  . Heart failure Mother   . Hypertension Mother   . Skin cancer Mother   . Colitis Mother   . Diabetes Mother   . Kidney Stones Mother   . Stroke Mother   .  Heart disease Father   . Colon polyps Father   . Diabetes Father   . Heart failure Father   . Heart attack Sister   . Diabetes Other   . Stroke Maternal Grandfather   . Diabetes Maternal Grandfather   . Colon cancer Neg Hx    Social History   Socioeconomic History  . Marital status: Married    Spouse name: Not on file  . Number of children: 3  . Years of education: some college  . Highest education level: Some college, no degree  Occupational History  . Occupation: retired    Fish farm manager: [employer]  Tobacco Use  . Smoking status: Never Smoker  . Smokeless tobacco: Never Used  Vaping Use  . Vaping Use: Never used  Substance and Sexual Activity  . Alcohol use: Yes    Alcohol/week: 0.0 standard drinks    Comment:  "mixed drink q few years"  . Drug use: Never  . Sexual activity: Yes    Birth control/protection: Post-menopausal  Other Topics Concern  . Not on file  Social History Narrative   Married   Patient is right handed.   Patient rarely drinks caffeine.   Lives at home with husband in two story home. Husband washes laundry in the basement. They have 3 daughters and 7 grandchildren.    Social Determinants of Health   Financial Resource Strain:   . Difficulty of Paying Living Expenses:   Food Insecurity:   . Worried About Charity fundraiser in the Last Year:   . Arboriculturist in the Last Year:   Transportation Needs:   . Film/video editor (Medical):   Marland Kitchen Lack of Transportation (Non-Medical):   Physical Activity:   . Days of Exercise per Week:   . Minutes of Exercise per Session:   Stress:   . Feeling of Stress :   Social Connections:   . Frequency of Communication with Friends and Family:   . Frequency of Social Gatherings with Friends and Family:   . Attends Religious Services:   . Active Member of Clubs or Organizations:   . Attends Archivist Meetings:   Marland Kitchen Marital  Status:     Activities of Daily Living In your present state of  health, do you have any difficulty performing the following activities: 03/11/2020 02/21/2020  Hearing? N N  Vision? N N  Difficulty concentrating or making decisions? N N  Walking or climbing stairs? Y Y  Dressing or bathing? N N  Doing errands, shopping? Tempie Donning  Preparing Food and eating ? N -  Using the Toilet? N -  In the past six months, have you accidently leaked urine? N -  Do you have problems with loss of bowel control? N -  Managing your Medications? N -  Managing your Finances? N -  Housekeeping or managing your Housekeeping? Y -  Some recent data might be hidden    Patient Education/ Literacy How often do you need to have someone help you when you read instructions, pamphlets, or other written materials from your doctor or pharmacy?: 2 - Rarely What is the last grade level you completed in school?: Some College  Exercise Current Exercise Habits: The patient does not participate in regular exercise at present, Exercise limited by: orthopedic condition(s)  Diet Patient reports consuming 3 meals a day and 2 snack(s) a day Patient reports that her primary diet is: Diabetic Patient reports that she does have regular access to food.   Depression Screen PHQ 2/9 Scores 03/11/2020 03/03/2020 01/24/2020 12/20/2019 10/23/2019 07/01/2019 05/01/2019  PHQ - 2 Score 0 0 0 0 0 0 0  PHQ- 9 Score - 2 - 0 0 0 -     Fall Risk Fall Risk  03/11/2020 03/03/2020 01/24/2020 12/20/2019 10/23/2019  Falls in the past year? 0 0 1 1 0  Number falls in past yr: - 0 1 0 -  Injury with Fall? - 0 0 1 -  Comment - - - - -  Risk for fall due to : - - Impaired balance/gait;Impaired mobility;History of fall(s) History of fall(s);Impaired balance/gait;Impaired mobility -  Follow up - Falls evaluation completed Falls evaluation completed Falls prevention discussed -     Objective:  Cortlyn P Winker seemed alert and oriented and she participated appropriately during our telephone visit.  Blood Pressure Weight BMI   BP Readings from Last 3 Encounters:  03/03/20 (!) 152/57  03/02/20 (!) 158/60  02/27/20 (!) 149/53   Wt Readings from Last 3 Encounters:  03/03/20 200 lb (90.7 kg)  03/02/20 199 lb 12.8 oz (90.6 kg)  02/27/20 202 lb 2.6 oz (91.7 kg)   BMI Readings from Last 1 Encounters:  03/03/20 31.32 kg/m    *Unable to obtain current vital signs, weight, and BMI due to telephone visit type  Hearing/Vision  . Tierre did not seem to have difficulty with hearing/understanding during the telephone conversation . Reports that she has had a formal eye exam by an eye care professional within the past year . Reports that she has not had a formal hearing evaluation within the past year *Unable to fully assess hearing and vision during telephone visit type  Cognitive Function: 6CIT Screen 03/11/2020 01/31/2019  What Year? 0 points 0 points  What month? 0 points 0 points  What time? 0 points 0 points  Count back from 20 0 points 0 points  Months in reverse 2 points 0 points  Repeat phrase 4 points 0 points  Total Score 6 0   (Normal:0-7, Significant for Dysfunction: >8)  Normal Cognitive Function Screening: Yes   Immunization & Health Maintenance Record Immunization History  Administered Date(s) Administered  .  Fluad Quad(high Dose 65+) 05/23/2019  . Influenza Split 05/16/2011  . Influenza Whole 05/15/2010  . Influenza, High Dose Seasonal PF 05/09/2016, 07/10/2017, 05/09/2018, 05/23/2019  . Influenza,inj,Quad PF,6+ Mos 05/22/2013, 06/03/2014, 05/14/2015  . Influenza-Unspecified 05/09/2016, 07/10/2017, 05/09/2018  . Moderna SARS-COVID-2 Vaccination 10/19/2019, 11/16/2019  . Pneumococcal Conjugate-13 05/09/2016, 08/07/2017  . Pneumococcal Polysaccharide-23 01/21/2014, 07/01/2019  . Pneumococcal-Unspecified 01/21/2014, 07/01/2019  . Tdap 04/26/2011    Health Maintenance  Topic Date Due  . DEXA SCAN  09/26/2018  . MAMMOGRAM  10/17/2019  . INFLUENZA VACCINE  03/15/2020  . COLONOSCOPY   07/13/2020  . HEMOGLOBIN A1C  07/17/2020  . OPHTHALMOLOGY EXAM  08/05/2020  . FOOT EXAM  12/19/2020  . TETANUS/TDAP  04/25/2021  . COVID-19 Vaccine  Completed  . Hepatitis C Screening  Completed  . PNA vac Low Risk Adult  Completed       Assessment  This is a routine wellness examination for [employer].  Health Maintenance: Due or Overdue Health Maintenance Due  Topic Date Due  . DEXA SCAN  09/26/2018  . MAMMOGRAM  10/17/2019    Graciela P Bai does not need a referral for Community Assistance: Care Management:   no Social Work:    no Prescription Assistance:  no Nutrition/Diabetes Education:  no   Plan:  Personalized Goals Goals Addressed            This Visit's Progress   . Prevent falls        Personalized Health Maintenance & Screening Recommendations   Lung Cancer Screening Recommended: no (Low Dose CT Chest recommended if Age 35-80 years, 30 pack-year currently smoking OR have quit w/in past 15 years) Hepatitis C Screening recommended: no HIV Screening recommended: no  Advanced Directives: Written information was not prepared per patient's request.  Referrals & Orders No orders of the defined types were placed in this encounter.   Follow-up Plan . Follow-up with Alison Norlander, DO as planned . Schedule 04/21/2020    I have personally reviewed and noted the following in the patient's chart:   . Medical and social history . Use of alcohol, tobacco or illicit drugs  . Current medications and supplements . Functional ability and status . Nutritional status . Physical activity . Advanced directives . List of other physicians . Hospitalizations, surgeries, and ER visits in previous 12 months . Vitals . Screenings to include cognitive, depression, and falls . Referrals and appointments  In addition, I have reviewed and discussed with Kaprice P Digioia certain preventive protocols, quality metrics, and best practice recommendations. A  written personalized care plan for preventive services as well as general preventive health recommendations is available and can be mailed to the patient at her request.      Maud Deed [facility]  02/18/2375

## 2020-03-11 NOTE — Patient Instructions (Signed)
  [place] Maintenance Summary and Written Plan of Care  [name] ,  Thank you for allowing me to perform your Medicare Annual Wellness Visit and for your ongoing commitment to your health.   Health Maintenance & Immunization History Health Maintenance  Topic Date Due  . DEXA SCAN  09/26/2018  . MAMMOGRAM  10/17/2019  . INFLUENZA VACCINE  03/15/2020  . COLONOSCOPY  07/13/2020  . HEMOGLOBIN A1C  07/17/2020  . OPHTHALMOLOGY EXAM  08/05/2020  . FOOT EXAM  12/19/2020  . TETANUS/TDAP  04/25/2021  . COVID-19 Vaccine  Completed  . Hepatitis C Screening  Completed  . PNA vac Low Risk Adult  Completed   Immunization History  Administered Date(s) Administered  . Fluad Quad(high Dose 65+) 05/23/2019  . Influenza Split 05/16/2011  . Influenza Whole 05/15/2010  . Influenza, High Dose Seasonal PF 05/09/2016, 07/10/2017, 05/09/2018, 05/23/2019  . Influenza,inj,Quad PF,6+ Mos 05/22/2013, 06/03/2014, 05/14/2015  . Influenza-Unspecified 05/09/2016, 07/10/2017, 05/09/2018  . Moderna SARS-COVID-2 Vaccination 10/19/2019, 11/16/2019  . Pneumococcal Conjugate-13 05/09/2016, 08/07/2017  . Pneumococcal Polysaccharide-23 01/21/2014, 07/01/2019  . Pneumococcal-Unspecified 01/21/2014, 07/01/2019  . Tdap 04/26/2011    These are the patient goals that we discussed: Goals Addressed            This Visit's Progress   . Prevent falls          This is a list of Health Maintenance Items that are overdue or due now: Health Maintenance Due  Topic Date Due  . DEXA SCAN  09/26/2018  . MAMMOGRAM  10/17/2019     Orders/Referrals Placed Today: No orders of the defined types were placed in this encounter.  (Contact our referral department at [phone] if you have not spoken with someone about your referral appointment within the next 5 days)    Follow-up Plan  Scheduled with Dr. Lajuana Ripple 04/21/2020 at 3:00pm.

## 2020-03-12 DIAGNOSIS — I5033 Acute on chronic diastolic (congestive) heart failure: Secondary | ICD-10-CM | POA: Diagnosis not present

## 2020-03-12 DIAGNOSIS — I2510 Atherosclerotic heart disease of native coronary artery without angina pectoris: Secondary | ICD-10-CM | POA: Diagnosis not present

## 2020-03-12 DIAGNOSIS — E1122 Type 2 diabetes mellitus with diabetic chronic kidney disease: Secondary | ICD-10-CM | POA: Diagnosis not present

## 2020-03-12 DIAGNOSIS — I130 Hypertensive heart and chronic kidney disease with heart failure and stage 1 through stage 4 chronic kidney disease, or unspecified chronic kidney disease: Secondary | ICD-10-CM | POA: Diagnosis not present

## 2020-03-12 DIAGNOSIS — I252 Old myocardial infarction: Secondary | ICD-10-CM | POA: Diagnosis not present

## 2020-03-12 DIAGNOSIS — I480 Paroxysmal atrial fibrillation: Secondary | ICD-10-CM | POA: Diagnosis not present

## 2020-03-12 LAB — ECHO TEE
AV Mean grad: 11 mmHg
AV Peak grad: 14.9 mmHg
Ao pk vel: 1.93 m/s
Area-P 1/2: 2.89 cm2

## 2020-03-13 ENCOUNTER — Other Ambulatory Visit: Payer: Self-pay

## 2020-03-13 ENCOUNTER — Telehealth (HOSPITAL_COMMUNITY): Payer: Self-pay | Admitting: *Deleted

## 2020-03-13 ENCOUNTER — Ambulatory Visit (INDEPENDENT_AMBULATORY_CARE_PROVIDER_SITE_OTHER): Payer: Medicare Other

## 2020-03-13 DIAGNOSIS — K449 Diaphragmatic hernia without obstruction or gangrene: Secondary | ICD-10-CM

## 2020-03-13 DIAGNOSIS — I130 Hypertensive heart and chronic kidney disease with heart failure and stage 1 through stage 4 chronic kidney disease, or unspecified chronic kidney disease: Secondary | ICD-10-CM

## 2020-03-13 DIAGNOSIS — I700 Atherosclerosis of aorta: Secondary | ICD-10-CM

## 2020-03-13 DIAGNOSIS — I70202 Unspecified atherosclerosis of native arteries of extremities, left leg: Secondary | ICD-10-CM | POA: Diagnosis not present

## 2020-03-13 DIAGNOSIS — E1122 Type 2 diabetes mellitus with diabetic chronic kidney disease: Secondary | ICD-10-CM

## 2020-03-13 DIAGNOSIS — N184 Chronic kidney disease, stage 4 (severe): Secondary | ICD-10-CM

## 2020-03-13 DIAGNOSIS — E785 Hyperlipidemia, unspecified: Secondary | ICD-10-CM

## 2020-03-13 DIAGNOSIS — N179 Acute kidney failure, unspecified: Secondary | ICD-10-CM

## 2020-03-13 DIAGNOSIS — Z8701 Personal history of pneumonia (recurrent): Secondary | ICD-10-CM

## 2020-03-13 DIAGNOSIS — I43 Cardiomyopathy in diseases classified elsewhere: Secondary | ICD-10-CM

## 2020-03-13 DIAGNOSIS — I252 Old myocardial infarction: Secondary | ICD-10-CM | POA: Diagnosis not present

## 2020-03-13 DIAGNOSIS — E1151 Type 2 diabetes mellitus with diabetic peripheral angiopathy without gangrene: Secondary | ICD-10-CM

## 2020-03-13 DIAGNOSIS — E1149 Type 2 diabetes mellitus with other diabetic neurological complication: Secondary | ICD-10-CM

## 2020-03-13 DIAGNOSIS — G4733 Obstructive sleep apnea (adult) (pediatric): Secondary | ICD-10-CM

## 2020-03-13 DIAGNOSIS — N8110 Cystocele, unspecified: Secondary | ICD-10-CM

## 2020-03-13 DIAGNOSIS — Z952 Presence of prosthetic heart valve: Secondary | ICD-10-CM

## 2020-03-13 DIAGNOSIS — Z87440 Personal history of urinary (tract) infections: Secondary | ICD-10-CM

## 2020-03-13 DIAGNOSIS — I2510 Atherosclerotic heart disease of native coronary artery without angina pectoris: Secondary | ICD-10-CM

## 2020-03-13 DIAGNOSIS — I480 Paroxysmal atrial fibrillation: Secondary | ICD-10-CM

## 2020-03-13 DIAGNOSIS — E854 Organ-limited amyloidosis: Secondary | ICD-10-CM | POA: Diagnosis not present

## 2020-03-13 DIAGNOSIS — I672 Cerebral atherosclerosis: Secondary | ICD-10-CM

## 2020-03-13 DIAGNOSIS — Z7901 Long term (current) use of anticoagulants: Secondary | ICD-10-CM

## 2020-03-13 DIAGNOSIS — I5033 Acute on chronic diastolic (congestive) heart failure: Secondary | ICD-10-CM

## 2020-03-13 DIAGNOSIS — Z86018 Personal history of other benign neoplasm: Secondary | ICD-10-CM

## 2020-03-13 DIAGNOSIS — K589 Irritable bowel syndrome without diarrhea: Secondary | ICD-10-CM

## 2020-03-13 DIAGNOSIS — K222 Esophageal obstruction: Secondary | ICD-10-CM

## 2020-03-13 DIAGNOSIS — Z9181 History of falling: Secondary | ICD-10-CM

## 2020-03-13 DIAGNOSIS — E1169 Type 2 diabetes mellitus with other specified complication: Secondary | ICD-10-CM

## 2020-03-13 DIAGNOSIS — Z95820 Peripheral vascular angioplasty status with implants and grafts: Secondary | ICD-10-CM

## 2020-03-13 DIAGNOSIS — E11649 Type 2 diabetes mellitus with hypoglycemia without coma: Secondary | ICD-10-CM

## 2020-03-13 DIAGNOSIS — I4430 Unspecified atrioventricular block: Secondary | ICD-10-CM

## 2020-03-13 DIAGNOSIS — E876 Hypokalemia: Secondary | ICD-10-CM

## 2020-03-13 DIAGNOSIS — Z89412 Acquired absence of left great toe: Secondary | ICD-10-CM

## 2020-03-13 DIAGNOSIS — Z8673 Personal history of transient ischemic attack (TIA), and cerebral infarction without residual deficits: Secondary | ICD-10-CM

## 2020-03-13 DIAGNOSIS — Z89422 Acquired absence of other left toe(s): Secondary | ICD-10-CM

## 2020-03-13 DIAGNOSIS — E872 Acidosis: Secondary | ICD-10-CM

## 2020-03-13 DIAGNOSIS — Z8742 Personal history of other diseases of the female genital tract: Secondary | ICD-10-CM

## 2020-03-13 DIAGNOSIS — K219 Gastro-esophageal reflux disease without esophagitis: Secondary | ICD-10-CM

## 2020-03-13 DIAGNOSIS — Z6831 Body mass index (BMI) 31.0-31.9, adult: Secondary | ICD-10-CM

## 2020-03-13 DIAGNOSIS — Z794 Long term (current) use of insulin: Secondary | ICD-10-CM

## 2020-03-13 DIAGNOSIS — M810 Age-related osteoporosis without current pathological fracture: Secondary | ICD-10-CM

## 2020-03-13 MED ORDER — HYDRALAZINE HCL 50 MG PO TABS: 50.0000 mg | ORAL_TABLET | Freq: Three times a day (TID) | ORAL | 0 refills | Status: DC

## 2020-03-13 NOTE — Telephone Encounter (Signed)
Pt left VM stating some of her medications were adjusted when she was in the hospital. Pts systolic bp is running 340-370 her diastolic bp is in the 96K.  pts heart rate also in the 70s.  Pt states her weight has been stable she is just concerned about bp.    Message routed to [place] for advice

## 2020-03-13 NOTE — Telephone Encounter (Signed)
Pt aware and verbalized understanding.  

## 2020-03-13 NOTE — Telephone Encounter (Signed)
Increase hydralazine to 75 mg tid.

## 2020-03-17 DIAGNOSIS — E1122 Type 2 diabetes mellitus with diabetic chronic kidney disease: Secondary | ICD-10-CM | POA: Diagnosis not present

## 2020-03-17 DIAGNOSIS — I2510 Atherosclerotic heart disease of native coronary artery without angina pectoris: Secondary | ICD-10-CM | POA: Diagnosis not present

## 2020-03-17 DIAGNOSIS — I5033 Acute on chronic diastolic (congestive) heart failure: Secondary | ICD-10-CM | POA: Diagnosis not present

## 2020-03-17 DIAGNOSIS — I130 Hypertensive heart and chronic kidney disease with heart failure and stage 1 through stage 4 chronic kidney disease, or unspecified chronic kidney disease: Secondary | ICD-10-CM | POA: Diagnosis not present

## 2020-03-17 DIAGNOSIS — I252 Old myocardial infarction: Secondary | ICD-10-CM | POA: Diagnosis not present

## 2020-03-17 DIAGNOSIS — I480 Paroxysmal atrial fibrillation: Secondary | ICD-10-CM | POA: Diagnosis not present

## 2020-03-19 DIAGNOSIS — I252 Old myocardial infarction: Secondary | ICD-10-CM | POA: Diagnosis not present

## 2020-03-19 DIAGNOSIS — I2510 Atherosclerotic heart disease of native coronary artery without angina pectoris: Secondary | ICD-10-CM | POA: Diagnosis not present

## 2020-03-19 DIAGNOSIS — I130 Hypertensive heart and chronic kidney disease with heart failure and stage 1 through stage 4 chronic kidney disease, or unspecified chronic kidney disease: Secondary | ICD-10-CM | POA: Diagnosis not present

## 2020-03-19 DIAGNOSIS — I480 Paroxysmal atrial fibrillation: Secondary | ICD-10-CM | POA: Diagnosis not present

## 2020-03-19 DIAGNOSIS — I5033 Acute on chronic diastolic (congestive) heart failure: Secondary | ICD-10-CM | POA: Diagnosis not present

## 2020-03-19 DIAGNOSIS — E1122 Type 2 diabetes mellitus with diabetic chronic kidney disease: Secondary | ICD-10-CM | POA: Diagnosis not present

## 2020-03-23 ENCOUNTER — Ambulatory Visit (HOSPITAL_COMMUNITY)
Admission: RE | Admit: 2020-03-23 | Discharge: 2020-03-23 | Disposition: A | Discharge status: Home / Self Care | Payer: Medicare Other | Source: Ambulatory Visit | Attending: Cardiology | Admitting: Cardiology

## 2020-03-23 ENCOUNTER — Other Ambulatory Visit: Payer: Self-pay

## 2020-03-23 ENCOUNTER — Encounter (HOSPITAL_COMMUNITY): Payer: Self-pay | Admitting: Cardiology

## 2020-03-23 VITALS — BP 180/60 | HR 71 | Ht 67.0 in | Wt 198.6 lb

## 2020-03-23 DIAGNOSIS — Z833 Family history of diabetes mellitus: Secondary | ICD-10-CM | POA: Diagnosis not present

## 2020-03-23 DIAGNOSIS — I2510 Atherosclerotic heart disease of native coronary artery without angina pectoris: Secondary | ICD-10-CM | POA: Diagnosis not present

## 2020-03-23 DIAGNOSIS — E1122 Type 2 diabetes mellitus with diabetic chronic kidney disease: Secondary | ICD-10-CM | POA: Insufficient documentation

## 2020-03-23 DIAGNOSIS — R001 Bradycardia, unspecified: Secondary | ICD-10-CM | POA: Insufficient documentation

## 2020-03-23 DIAGNOSIS — Z79899 Other long term (current) drug therapy: Secondary | ICD-10-CM | POA: Diagnosis not present

## 2020-03-23 DIAGNOSIS — I4819 Other persistent atrial fibrillation: Secondary | ICD-10-CM | POA: Diagnosis not present

## 2020-03-23 DIAGNOSIS — Z888 Allergy status to other drugs, medicaments and biological substances status: Secondary | ICD-10-CM | POA: Insufficient documentation

## 2020-03-23 DIAGNOSIS — N183 Chronic kidney disease, stage 3 unspecified: Secondary | ICD-10-CM | POA: Insufficient documentation

## 2020-03-23 DIAGNOSIS — Z8249 Family history of ischemic heart disease and other diseases of the circulatory system: Secondary | ICD-10-CM | POA: Insufficient documentation

## 2020-03-23 DIAGNOSIS — Z794 Long term (current) use of insulin: Secondary | ICD-10-CM | POA: Insufficient documentation

## 2020-03-23 DIAGNOSIS — Z953 Presence of xenogenic heart valve: Secondary | ICD-10-CM | POA: Insufficient documentation

## 2020-03-23 DIAGNOSIS — E1151 Type 2 diabetes mellitus with diabetic peripheral angiopathy without gangrene: Secondary | ICD-10-CM | POA: Diagnosis not present

## 2020-03-23 DIAGNOSIS — E785 Hyperlipidemia, unspecified: Secondary | ICD-10-CM | POA: Diagnosis not present

## 2020-03-23 DIAGNOSIS — I059 Rheumatic mitral valve disease, unspecified: Secondary | ICD-10-CM | POA: Diagnosis not present

## 2020-03-23 DIAGNOSIS — G4733 Obstructive sleep apnea (adult) (pediatric): Secondary | ICD-10-CM | POA: Insufficient documentation

## 2020-03-23 DIAGNOSIS — I480 Paroxysmal atrial fibrillation: Secondary | ICD-10-CM | POA: Diagnosis not present

## 2020-03-23 DIAGNOSIS — I5032 Chronic diastolic (congestive) heart failure: Secondary | ICD-10-CM | POA: Diagnosis not present

## 2020-03-23 DIAGNOSIS — I130 Hypertensive heart and chronic kidney disease with heart failure and stage 1 through stage 4 chronic kidney disease, or unspecified chronic kidney disease: Secondary | ICD-10-CM | POA: Insufficient documentation

## 2020-03-23 DIAGNOSIS — Z886 Allergy status to analgesic agent status: Secondary | ICD-10-CM | POA: Insufficient documentation

## 2020-03-23 DIAGNOSIS — K219 Gastro-esophageal reflux disease without esophagitis: Secondary | ICD-10-CM | POA: Diagnosis not present

## 2020-03-23 DIAGNOSIS — Z7901 Long term (current) use of anticoagulants: Secondary | ICD-10-CM | POA: Insufficient documentation

## 2020-03-23 DIAGNOSIS — Z9884 Bariatric surgery status: Secondary | ICD-10-CM | POA: Diagnosis not present

## 2020-03-23 LAB — CBC
HCT: 37.4 % (ref 36.0–46.0)
Hemoglobin: 11 g/dL — ABNORMAL LOW (ref 12.0–15.0)
MCH: 22.4 pg — ABNORMAL LOW (ref 26.0–34.0)
MCHC: 29.4 g/dL — ABNORMAL LOW (ref 30.0–36.0)
MCV: 76.3 fL — ABNORMAL LOW (ref 80.0–100.0)
Platelets: 288 10*3/uL (ref 150–400)
RBC: 4.9 MIL/uL (ref 3.87–5.11)
RDW: 16.1 % — ABNORMAL HIGH (ref 11.5–15.5)
WBC: 7.5 10*3/uL (ref 4.0–10.5)
nRBC: 0 % (ref 0.0–0.2)

## 2020-03-23 LAB — COMPREHENSIVE METABOLIC PANEL
ALT: 65 U/L — ABNORMAL HIGH (ref 0–44)
AST: 77 U/L — ABNORMAL HIGH (ref 15–41)
Albumin: 3.3 g/dL — ABNORMAL LOW (ref 3.5–5.0)
Alkaline Phosphatase: 37 U/L — ABNORMAL LOW (ref 38–126)
Anion gap: 15 (ref 5–15)
BUN: 56 mg/dL — ABNORMAL HIGH (ref 8–23)
CO2: 27 mmol/L (ref 22–32)
Calcium: 8.8 mg/dL — ABNORMAL LOW (ref 8.9–10.3)
Chloride: 98 mmol/L (ref 98–111)
Creatinine, Ser: 3.32 mg/dL — ABNORMAL HIGH (ref 0.44–1.00)
GFR calc Af Amer: 16 mL/min — ABNORMAL LOW (ref >60)
GFR calc non Af Amer: 13 mL/min — ABNORMAL LOW (ref >60)
Glucose, Bld: 298 mg/dL — ABNORMAL HIGH (ref 70–99)
Potassium: 3.6 mmol/L (ref 3.5–5.1)
Sodium: 140 mmol/L (ref 135–145)
Total Bilirubin: 0.7 mg/dL (ref 0.3–1.2)
Total Protein: 6.4 g/dL — ABNORMAL LOW (ref 6.5–8.1)

## 2020-03-23 LAB — TSH: TSH: 1.094 u[IU]/mL (ref 0.350–4.500)

## 2020-03-23 MED ORDER — HYDRALAZINE HCL 50 MG PO TABS: 50.0000 mg | ORAL_TABLET | Freq: Three times a day (TID) | ORAL | 0 refills | Status: DC

## 2020-03-23 MED ORDER — NYSTATIN 100000 UNIT/ML MT SUSP
5.0000 mL | Freq: Four times a day (QID) | OROMUCOSAL | 0 refills | Status: AC
Start: 2020-03-23 — End: 2020-03-30

## 2020-03-23 MED ORDER — DOCUSATE SODIUM 50 MG PO CAPS
100.0000 mg | ORAL_CAPSULE | Freq: Two times a day (BID) | ORAL | 0 refills | Status: DC
Start: 2020-03-23 — End: 2020-09-03

## 2020-03-23 NOTE — Progress Notes (Signed)
Date:  03/23/2020   ID:  [name], DOB 03-22-1951, MRN [id number]    Provider location: [facility] Type of Visit: Established patient   PCP:  [name], DO  Cardiologist:  Minus Breeding, MD Primary HF: Dr. Aundra Dubin  Chief Complaint: Shortness of breath   History of Present Illness: [name] is a 69 y.o. female who has a history of AS s/p TAVR, chronic diastolic CHF, PAF, CKD III, HLD, HTN, OSA, and morbid obesity.   She was admitted from [facility] office 09/19/18 with marked volume overload. She diuresed 36 lbs with lasix drip and was transitioned to torsemide 40 mg daily. Course complicated by AKI. RHC completed and showed elevated filling pressures and preserved cardiac output.  PYP scan suggestive of TTR amyloid. Discharge weight was 208 pounds.   She developed a non-healing right heal ulcer followed by a podiatrist in [place].  She ended up having a peripheral angiogram in [place] with PTCA to the right popliteal and right AT.    In 9/20, she was admitted with fever and found to have Enterococcal bacteremia.  TEE was concerning for possible MV vegetation, bioprosthetic aortic valve looked ok.  She was treated with IV antibiotics, and repeat TEE in 10/20 did not show residual active endocarditis.    She is now s/p left distal SFA-tibioperoneal trunk bypass with left TMA done at [facility] in 2/21.   She was admitted in 7/21 junctional bradycardia/hypotension.  Coreg and amiodarone stopped.  She went into atrial fibrillation and ended up getting TEE-guided DCCV.  Amiodarone was restarted.  She was also treated for PNA and UTI.   She returns today for followup of CHF, PAF, aortic valve disease.  She is not walking much s/p TMA, mainly in wheelchair but using walker with PT and using walker to get in bathroom.  No chest pain.  No dyspnea walking short distances with her walker.  No orthopnea/PND.  Weight stable at home.  Mouth is dry  and she has lost her sense of taste.  BP is very high today.  She is only taking hydralazine 15 mg tid. No peripheral edema. No claudication.  She gets PT twice a week.   ECG (personally reviewed): NSR, inferolateral TWIs  Labs (11/19): LDL 115 Labs (2/20): K 4.4, creatinine 2.39, myeloma panel negative Labs (7/20): LDL 150, triglycerides 422, K 4.1, creatinine 2.97 => 2.06 Labs (11/20): K 3.8, creatinine 2.48 Labs (12/20): K 4.4, creatinine 2.75, LDL 33, TGs 457 Labs (2/21): LDL 35, TGs 229, HDL 36 Labs (7/21): K 4.2, creatinine 2.9  PMH: 1. Chronic diastolic CHF: Echo (6/64) with EF 60-65%, moderate LVH, mildly dilated RV with mildly decreased systolic function, bioprosthetic aortic valve s/p TAVR appeared to function normally.  - RHC (2/20): mean RA 13, PA 50/21 mean 33, PCWP mean 25, CI 2.92, PVR 1.3 WU  - TEE (10/20): EF 60-65%, mild LVH, mild MR, s/p TAVR with no stenosis and mild peri-valvular leakage, no active endocarditis.  - TEE (7/21): EF 55-60%, mild LVH, PASP 66, bioprosthetic aortic valve with mean gradient 11 mmHg, old mitral valve vegetation, mild-moderate MR with mild MS.  2. Cardiac amyloidosis: TTR amyloidosis.   - PYP scan (2/20): grade 2, H/CL ratio 1.8 => suggestive of TTR amyloidosis.  - myeloma panel negative.  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  3. Bioprosthetic aortic valve s/p TAVR.  4. CKD: Stage 3.  5. Atrial fibrillation: Paroxysmal.  - TEE-guided  DCCV in 7/21.  6. Hyperlipidemia: unable to tolerate Crestor or Vascepa.  7. GERD with hiatal hernia 8. HTN 9. Obesity s/p gastric bypass in 2015.  10. Type 2 diabetes 11. OSA: Unable to tolerate CPAP.  12. CAD: LHC (8/19) with 50% proximal RCA, 50% proximal LCx, 65% mid LCx, 99% distal LAD (medical management).  13. Carotid dopplers (8/19) with mild disease.  14. PAD: Peripheral arterial angiography in 7/20 with PTCA right popliteal artery and right AT.  - S/p left distal SFA-TP trunk  bypass and left TMA in 2/21.  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  16. Mitral valve disease: TEE in 7/21 with mild-moderate MR, mild MS.  17. 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    Current Outpatient Medications  Medication Sig Dispense Refill  . ACCU-CHEK AVIVA PLUS test strip USE TO CHECK BLOOD SUGAR UP TO TWICE DAILY OR AS INSTRUCTED 100 each 2  . amiodarone (PACERONE) 200 MG tablet Take 1 tablet (200 mg) twice daily for 2 weeks then take 1 tablet (200 mg) once daily 60 tablet 0  . B-D INS SYR ULTRAFINE 1CC/31G 31G X 5/16" 1 ML MISC USE TO INJECT INSULIN TWICE A DAY AS INSTRUCTED (Patient taking differently: 1 Stick by Other route 3 (three) times daily before meals. ) 100 each 2  . Calcium Carbonate (CALCIUM 600 PO) Take 1 tablet by mouth daily.    . Cholecalciferol (VITAMIN D3 PO) Take 1 tablet by mouth in the morning and at bedtime.     . Cyanocobalamin (VITAMIN B-12 PO) Take 1 tablet by mouth daily. 2 gummies by mouth daily     . ELIQUIS 5 MG TABS tablet TAKE 1 TABLET BY MOUTH TWICE A DAY 180 tablet 1  . fenofibrate (TRICOR) 145 MG tablet TAKE 1 TABLET BY MOUTH EVERY DAY 90 tablet 3  . hydrALAZINE (APRESOLINE) 50 MG tablet Take 1 tablet (50 mg total) by mouth every 8 (eight) hours. 90 tablet 0  . insulin NPH Human (NOVOLIN N) 100 UNIT/ML injection Inject 20-60 Units into the skin 2 (two) times daily before a meal. 60 units in the am 60 units at lunch and 20 units for supper - on a sliding scale    . Insulin Pen Needle 32G X 4 MM MISC Use to inject insulin with insulin pen 100 each 4  . loratadine (CLARITIN) 10 MG tablet Take 10 mg by mouth daily.     . meclizine (ANTIVERT) 12.5 MG tablet Take 1 tablet (12.5 mg total) by mouth 3 (three) times daily as needed for dizziness. 30 tablet 5  . Multiple Vitamins-Minerals (MULTIVITAMIN GUMMIES ADULT PO) Taking two gummies by mouth daily    . NOVOLIN R RELION 100 UNIT/ML injection Inject  20-30 Units into the skin 3 (three) times daily before meals. Per sliding scale    . nutrition supplement, JUVEN, (JUVEN) PACK Take 1 packet by mouth 2 (two) times daily between meals. 60 packet 0  . ondansetron (ZOFRAN ODT) 4 MG disintegrating tablet Take 1 tablet (4 mg total) by mouth every 8 (eight) hours as needed for nausea. 10 tablet 0  . PRALUENT 75 MG/ML SOAJ INJECT 75 MG INTO THE SKIN EVERY 14 (FOURTEEN) DAYS. 2 pen 6  . RELION INSULIN SYRINGE 31G X 15/64" 1 ML MISC Inject 1 Syringe into the skin 3 (three) times daily.    . Tafamidis (VYNDAMAX) 61 MG CAPS Take 61 mg by mouth daily. 30 capsule 11  .  torsemide (DEMADEX) 20 MG tablet Take 1 tablet (20 mg total) by mouth 2 (two) times daily. 60 tablet 0  . VASCEPA 1 g capsule TAKE 2 CAPSULES (2 G TOTAL) BY MOUTH 2 (TWO) TIMES A DAY. 120 capsule 6  . docusate sodium (COLACE) 50 MG capsule Take 2 capsules (100 mg total) by mouth 2 (two) times daily. 10 capsule 0  . nystatin (MYCOSTATIN) 100000 UNIT/ML suspension Use as directed 5 mLs (500,000 Units total) in the mouth or throat 4 (four) times daily for 7 days. Swish and Spit 60 mL 0   No current facility-administered medications for this encounter.    Allergies:   Amlodipine, Atorvastatin, Crestor [rosuvastatin calcium], Ezetimibe-simvastatin, Sitagliptin, Statins, Celebrex [celecoxib], and Levemir [insulin detemir]   Social History:  The patient  reports that she has never smoked. She has never used smokeless tobacco. She reports previous alcohol use. She reports that she does not use drugs.   Family History:  The patient's family history includes Colitis in her mother; Colon polyps in her father; Diabetes in her father, maternal grandfather, mother, and another family member; Heart attack in her sister; Heart disease in her father; Heart failure in her father and mother; Hypertension in her mother; Kidney Stones in her mother; Skin cancer in her mother; Stroke in her maternal grandfather and  mother.   ROS:  Please see the history of present illness.   All other systems are personally reviewed and negative.   Exam:   BP (!) 180/60   Pulse 71   Ht 5\' 7"  (1.702 m)   Wt 90.1 kg (198 lb 9.6 oz)   SpO2 96%   BMI 31.11 kg/m  General: NAD Neck: No JVD, no thyromegaly or thyroid nodule.  Lungs: Clear to auscultation bilaterally with normal respiratory effort. CV: Nondisplaced PMI.  Heart regular S1/S2, no S3/S4, 2/6 early SEM RUSB.  No peripheral edema.  No carotid bruit.  Difficult to palpate pedal pulses.  Abdomen: Soft, nontender, no hepatosplenomegaly, no distention.  Skin: Intact without lesions or rashes.  Neurologic: Alert and oriented x 3.  Psych: Normal affect. Extremities: No clubbing or cyanosis.  HEENT: Tongue appears beefy red.   Recent Labs: 02/21/2020: B Natriuretic Peptide 538.2 02/27/2020: Magnesium 2.0 03/23/2020: ALT 65; BUN 56; Creatinine, Ser 3.32; Hemoglobin 11.0; Platelets 288; Potassium 3.6; Sodium 140; TSH 1.094  Personally reviewed   Wt Readings from Last 3 Encounters:  03/23/20 90.1 kg (198 lb 9.6 oz)  03/03/20 90.7 kg (200 lb)  03/02/20 90.6 kg (199 lb 12.8 oz)    ASSESSMENT AND PLAN:  1. Chronic diastolic CHF: TEE in 1/06 showed EF 55-60%, mild LVH, stable bioprosthetic aortic valve s/p TAVR.PYP scan in 2/20 suggestive of transthyretin amyloidosis, genetic testing suggestive of wild type.  NYHA class II-III symptoms, she is at baseline. She does not look volume overloaded on exam.  - Continue torsemide 20 mg bid. BMET today.    - I think she would be a good candidate for Cardiomems.  We discussed this today, she will think about it.  2. CKD stage 3:  BMET today.   3. CAD: Cath in 8/19 with moderate RCA and LCx disease, severe distal LAD disease, medical management.  No chest pain. - No ASA given apixaban use.  - Continue pitivastatin 4 mg daily and Praluent.    - Continue fenofibrate and vascepa.  4. Atrial fibrillation: Paroxysmal. She is  in NSR today on amiodarone.  - Continue Eliquis. CBC today.  - Continue amiodarone  200 mg daily, she will need LFTs and TSH checked today.  She will need a regular eye exam.  5. HTN: BP running high.  She is off Coreg with junctional bradycardia at 7/21 admission.  - She cannot take amlodipine due to peripheral edema formation.  - Hold off on ACEI/ARB with CKD.  - Increase hydralazine to 50 mg tid, this is actually the dose she was supposed to go home on.     6. PAD: S/p PTCA to right popliteal and right AT in 7/20.  Now with left distal SFA-TP trunk bypass and left TMA in 2/21.   - She has followup with vascular surgery at [facility].  7. 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  - She is on tafamidis, continue   8. 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   9. Enterococcal endocarditis: Involving mitral valve, 7/21 TEE showed possible old calcified vegetation.    10. Hyperlipidemia: Lipids improved on 2/21 panel, TGs still high but better.  Continue current regimen.  11. Mitral valve disease: TEE in 7/21 with mild-moderate MR, mild MS.   12. Thrush: Suspected, beefy red tongue with inability to taste.  Will have her take Nystatin suspension.   Followup in with HF pharmacist in 2 wks for med titration.  See me in 6 wks.   Signed, [name], MD  03/23/2020   [facility] [address] [facility] [place] [address] [phone] (office) [phone] (fax)

## 2020-03-23 NOTE — Patient Instructions (Signed)
Increase Hydralazine to 50 mg Three times a day   Take Colace 100 mg Twice daily for Constipation, this is an over the counter medication  Take Nystatin 4 times a day for 1 week, this is a liquid you  Will swish in your mouth then spit it out  Labs done today  You have been referred for a Cardiomems Device Please review the educational material we have provided to you We will submit the application to your insurance company, this process can take 4-8 weeks Once it is approved we will call you to schedule the procedure  Once it is implanted you can expect:   A weekly call from our office about your readings for the first 3 weeks after implant  After that we will just call you periodically    A follow up appointment with our office about 3 weeks after implant   A monthly bill from our office  **If you have questions about this process or the device please Kevan Rosebush, RN at [phone]  Please follow up with our heart failure pharmacist in 2-4 weeks  Your physician recommends that you schedule a follow-up appointment in: 6 weeks  If you have any questions or concerns before your next appointment please send [place] a message through [place] or call our office at [phone].    TO LEAVE A MESSAGE FOR THE NURSE SELECT OPTION 2, PLEASE LEAVE A MESSAGE INCLUDING: . YOUR NAME . DATE OF BIRTH . CALL BACK NUMBER . REASON FOR CALL**this is important as we prioritize the call backs  [place] AS LONG AS YOU CALL BEFORE 4:00 PM  At the [facility], you and your health needs are our priority. As part of our continuing mission to provide you with exceptional heart care, we have created designated Provider Care Teams. These Care Teams include your primary Cardiologist (physician) and Advanced Practice Providers (APPs- Physician Assistants and Nurse Practitioners) who all work together to provide you with the care you need, when you need it.    You may see any of the following providers on your designated Care Team at your next follow up: Marland Kitchen Dr Glori Bickers . Dr [name] . Darrick Grinder, NP . Lyda Jester, PA . Audry Riles, PharmD   Please be sure to bring in all your medications bottles to every appointment.

## 2020-03-24 DIAGNOSIS — I130 Hypertensive heart and chronic kidney disease with heart failure and stage 1 through stage 4 chronic kidney disease, or unspecified chronic kidney disease: Secondary | ICD-10-CM | POA: Diagnosis not present

## 2020-03-24 DIAGNOSIS — I480 Paroxysmal atrial fibrillation: Secondary | ICD-10-CM | POA: Diagnosis not present

## 2020-03-24 DIAGNOSIS — I2510 Atherosclerotic heart disease of native coronary artery without angina pectoris: Secondary | ICD-10-CM | POA: Diagnosis not present

## 2020-03-24 DIAGNOSIS — I5033 Acute on chronic diastolic (congestive) heart failure: Secondary | ICD-10-CM | POA: Diagnosis not present

## 2020-03-24 DIAGNOSIS — E1122 Type 2 diabetes mellitus with diabetic chronic kidney disease: Secondary | ICD-10-CM | POA: Diagnosis not present

## 2020-03-24 DIAGNOSIS — I252 Old myocardial infarction: Secondary | ICD-10-CM | POA: Diagnosis not present

## 2020-03-26 ENCOUNTER — Ambulatory Visit (HOSPITAL_COMMUNITY): Payer: Medicare Other | Admitting: Nurse Practitioner

## 2020-03-27 DIAGNOSIS — E1122 Type 2 diabetes mellitus with diabetic chronic kidney disease: Secondary | ICD-10-CM | POA: Diagnosis not present

## 2020-03-27 DIAGNOSIS — I2510 Atherosclerotic heart disease of native coronary artery without angina pectoris: Secondary | ICD-10-CM | POA: Diagnosis not present

## 2020-03-27 DIAGNOSIS — I252 Old myocardial infarction: Secondary | ICD-10-CM | POA: Diagnosis not present

## 2020-03-27 DIAGNOSIS — I480 Paroxysmal atrial fibrillation: Secondary | ICD-10-CM | POA: Diagnosis not present

## 2020-03-27 DIAGNOSIS — I130 Hypertensive heart and chronic kidney disease with heart failure and stage 1 through stage 4 chronic kidney disease, or unspecified chronic kidney disease: Secondary | ICD-10-CM | POA: Diagnosis not present

## 2020-03-27 DIAGNOSIS — I5033 Acute on chronic diastolic (congestive) heart failure: Secondary | ICD-10-CM | POA: Diagnosis not present

## 2020-03-30 DIAGNOSIS — E669 Obesity, unspecified: Secondary | ICD-10-CM | POA: Diagnosis not present

## 2020-03-30 DIAGNOSIS — E7800 Pure hypercholesterolemia, unspecified: Secondary | ICD-10-CM | POA: Diagnosis not present

## 2020-03-30 DIAGNOSIS — E1165 Type 2 diabetes mellitus with hyperglycemia: Secondary | ICD-10-CM | POA: Diagnosis not present

## 2020-03-30 DIAGNOSIS — N189 Chronic kidney disease, unspecified: Secondary | ICD-10-CM | POA: Diagnosis not present

## 2020-03-30 DIAGNOSIS — I10 Essential (primary) hypertension: Secondary | ICD-10-CM | POA: Diagnosis not present

## 2020-03-30 DIAGNOSIS — R809 Proteinuria, unspecified: Secondary | ICD-10-CM | POA: Diagnosis not present

## 2020-03-31 ENCOUNTER — Other Ambulatory Visit: Payer: Medicare Other

## 2020-03-31 ENCOUNTER — Other Ambulatory Visit: Payer: Self-pay

## 2020-03-31 DIAGNOSIS — I129 Hypertensive chronic kidney disease with stage 1 through stage 4 chronic kidney disease, or unspecified chronic kidney disease: Secondary | ICD-10-CM | POA: Diagnosis not present

## 2020-03-31 DIAGNOSIS — I5030 Unspecified diastolic (congestive) heart failure: Secondary | ICD-10-CM | POA: Diagnosis not present

## 2020-03-31 DIAGNOSIS — E1169 Type 2 diabetes mellitus with other specified complication: Secondary | ICD-10-CM

## 2020-03-31 DIAGNOSIS — N183 Chronic kidney disease, stage 3 unspecified: Secondary | ICD-10-CM

## 2020-03-31 DIAGNOSIS — E785 Hyperlipidemia, unspecified: Secondary | ICD-10-CM

## 2020-03-31 DIAGNOSIS — N184 Chronic kidney disease, stage 4 (severe): Secondary | ICD-10-CM | POA: Diagnosis not present

## 2020-03-31 DIAGNOSIS — E1122 Type 2 diabetes mellitus with diabetic chronic kidney disease: Secondary | ICD-10-CM | POA: Diagnosis not present

## 2020-03-31 DIAGNOSIS — I5032 Chronic diastolic (congestive) heart failure: Secondary | ICD-10-CM

## 2020-03-31 NOTE — Addendum Note (Signed)
Addended by: Earlene Plater on: 03/31/2020 11:31 AM   Modules accepted: Orders

## 2020-04-01 DIAGNOSIS — E1122 Type 2 diabetes mellitus with diabetic chronic kidney disease: Secondary | ICD-10-CM | POA: Diagnosis not present

## 2020-04-01 DIAGNOSIS — I130 Hypertensive heart and chronic kidney disease with heart failure and stage 1 through stage 4 chronic kidney disease, or unspecified chronic kidney disease: Secondary | ICD-10-CM | POA: Diagnosis not present

## 2020-04-01 DIAGNOSIS — I2510 Atherosclerotic heart disease of native coronary artery without angina pectoris: Secondary | ICD-10-CM | POA: Diagnosis not present

## 2020-04-01 DIAGNOSIS — I252 Old myocardial infarction: Secondary | ICD-10-CM | POA: Diagnosis not present

## 2020-04-01 DIAGNOSIS — I480 Paroxysmal atrial fibrillation: Secondary | ICD-10-CM | POA: Diagnosis not present

## 2020-04-01 DIAGNOSIS — I5033 Acute on chronic diastolic (congestive) heart failure: Secondary | ICD-10-CM | POA: Diagnosis not present

## 2020-04-01 LAB — BASIC METABOLIC PANEL
BUN/Creatinine Ratio: 17 (ref 12–28)
BUN: 47 mg/dL — ABNORMAL HIGH (ref 8–27)
CO2: 27 mmol/L (ref 20–29)
Calcium: 8.5 mg/dL — ABNORMAL LOW (ref 8.7–10.3)
Chloride: 99 mmol/L (ref 96–106)
Creatinine, Ser: 2.83 mg/dL — ABNORMAL HIGH (ref 0.57–1.00)
GFR calc Af Amer: 19 mL/min/{1.73_m2} — ABNORMAL LOW (ref >59)
GFR calc non Af Amer: 16 mL/min/{1.73_m2} — ABNORMAL LOW (ref >59)
Glucose: 269 mg/dL — ABNORMAL HIGH (ref 65–99)
Potassium: 4.2 mmol/L (ref 3.5–5.2)
Sodium: 140 mmol/L (ref 134–144)

## 2020-04-01 LAB — LIPID PANEL
Chol/HDL Ratio: 2.7 ratio (ref 0.0–4.4)
Cholesterol, Total: 122 mg/dL (ref 100–199)
HDL: 45 mg/dL (ref >39)
LDL Chol Calc (NIH): 53 mg/dL (ref 0–99)
Triglycerides: 140 mg/dL (ref 0–149)
VLDL Cholesterol Cal: 24 mg/dL (ref 5–40)

## 2020-04-02 DIAGNOSIS — E854 Organ-limited amyloidosis: Secondary | ICD-10-CM | POA: Diagnosis not present

## 2020-04-02 DIAGNOSIS — E876 Hypokalemia: Secondary | ICD-10-CM | POA: Diagnosis not present

## 2020-04-02 DIAGNOSIS — I700 Atherosclerosis of aorta: Secondary | ICD-10-CM | POA: Diagnosis not present

## 2020-04-02 DIAGNOSIS — E11649 Type 2 diabetes mellitus with hypoglycemia without coma: Secondary | ICD-10-CM | POA: Diagnosis not present

## 2020-04-02 DIAGNOSIS — I4430 Unspecified atrioventricular block: Secondary | ICD-10-CM | POA: Diagnosis not present

## 2020-04-02 DIAGNOSIS — N179 Acute kidney failure, unspecified: Secondary | ICD-10-CM | POA: Diagnosis not present

## 2020-04-02 DIAGNOSIS — N8110 Cystocele, unspecified: Secondary | ICD-10-CM | POA: Diagnosis not present

## 2020-04-02 DIAGNOSIS — I252 Old myocardial infarction: Secondary | ICD-10-CM | POA: Diagnosis not present

## 2020-04-02 DIAGNOSIS — I672 Cerebral atherosclerosis: Secondary | ICD-10-CM | POA: Diagnosis not present

## 2020-04-02 DIAGNOSIS — E872 Acidosis: Secondary | ICD-10-CM | POA: Diagnosis not present

## 2020-04-02 DIAGNOSIS — E1122 Type 2 diabetes mellitus with diabetic chronic kidney disease: Secondary | ICD-10-CM | POA: Diagnosis not present

## 2020-04-02 DIAGNOSIS — N184 Chronic kidney disease, stage 4 (severe): Secondary | ICD-10-CM | POA: Diagnosis not present

## 2020-04-02 DIAGNOSIS — I70202 Unspecified atherosclerosis of native arteries of extremities, left leg: Secondary | ICD-10-CM | POA: Diagnosis not present

## 2020-04-02 DIAGNOSIS — I5033 Acute on chronic diastolic (congestive) heart failure: Secondary | ICD-10-CM | POA: Diagnosis not present

## 2020-04-02 DIAGNOSIS — G4733 Obstructive sleep apnea (adult) (pediatric): Secondary | ICD-10-CM | POA: Diagnosis not present

## 2020-04-02 DIAGNOSIS — K589 Irritable bowel syndrome without diarrhea: Secondary | ICD-10-CM | POA: Diagnosis not present

## 2020-04-02 DIAGNOSIS — K449 Diaphragmatic hernia without obstruction or gangrene: Secondary | ICD-10-CM | POA: Diagnosis not present

## 2020-04-02 DIAGNOSIS — I43 Cardiomyopathy in diseases classified elsewhere: Secondary | ICD-10-CM | POA: Diagnosis not present

## 2020-04-02 DIAGNOSIS — I480 Paroxysmal atrial fibrillation: Secondary | ICD-10-CM | POA: Diagnosis not present

## 2020-04-02 DIAGNOSIS — E1149 Type 2 diabetes mellitus with other diabetic neurological complication: Secondary | ICD-10-CM | POA: Diagnosis not present

## 2020-04-02 DIAGNOSIS — I2510 Atherosclerotic heart disease of native coronary artery without angina pectoris: Secondary | ICD-10-CM | POA: Diagnosis not present

## 2020-04-02 DIAGNOSIS — M810 Age-related osteoporosis without current pathological fracture: Secondary | ICD-10-CM | POA: Diagnosis not present

## 2020-04-02 DIAGNOSIS — I130 Hypertensive heart and chronic kidney disease with heart failure and stage 1 through stage 4 chronic kidney disease, or unspecified chronic kidney disease: Secondary | ICD-10-CM | POA: Diagnosis not present

## 2020-04-02 DIAGNOSIS — K222 Esophageal obstruction: Secondary | ICD-10-CM | POA: Diagnosis not present

## 2020-04-02 DIAGNOSIS — E1151 Type 2 diabetes mellitus with diabetic peripheral angiopathy without gangrene: Secondary | ICD-10-CM | POA: Diagnosis not present

## 2020-04-03 ENCOUNTER — Telehealth (HOSPITAL_COMMUNITY): Payer: Self-pay | Admitting: Cardiology

## 2020-04-03 DIAGNOSIS — E1122 Type 2 diabetes mellitus with diabetic chronic kidney disease: Secondary | ICD-10-CM | POA: Diagnosis not present

## 2020-04-03 DIAGNOSIS — I480 Paroxysmal atrial fibrillation: Secondary | ICD-10-CM | POA: Diagnosis not present

## 2020-04-03 DIAGNOSIS — I130 Hypertensive heart and chronic kidney disease with heart failure and stage 1 through stage 4 chronic kidney disease, or unspecified chronic kidney disease: Secondary | ICD-10-CM | POA: Diagnosis not present

## 2020-04-03 DIAGNOSIS — I2510 Atherosclerotic heart disease of native coronary artery without angina pectoris: Secondary | ICD-10-CM | POA: Diagnosis not present

## 2020-04-03 DIAGNOSIS — I252 Old myocardial infarction: Secondary | ICD-10-CM | POA: Diagnosis not present

## 2020-04-03 DIAGNOSIS — I5033 Acute on chronic diastolic (congestive) heart failure: Secondary | ICD-10-CM | POA: Diagnosis not present

## 2020-04-03 NOTE — Telephone Encounter (Signed)
Increase torsemide back to 20 mg bid.  Would be helpful to get Cardiomems.

## 2020-04-03 NOTE — Telephone Encounter (Signed)
Patient called to report weight increase since decreasing torsemide Reports +SOB, slept in recliner (normally able to lie flat) Weight normally 198-200, weight today 205 Patient is not compliant with fluid restrictions  Advised would forward to provider for further instructions

## 2020-04-06 NOTE — Telephone Encounter (Signed)
Patient advised however she states she is going to take one tablet every other day,fyi

## 2020-04-07 DIAGNOSIS — B351 Tinea unguium: Secondary | ICD-10-CM | POA: Diagnosis not present

## 2020-04-07 DIAGNOSIS — M24573 Contracture, unspecified ankle: Secondary | ICD-10-CM | POA: Diagnosis not present

## 2020-04-07 DIAGNOSIS — M205X1 Other deformities of toe(s) (acquired), right foot: Secondary | ICD-10-CM | POA: Diagnosis not present

## 2020-04-07 DIAGNOSIS — E1142 Type 2 diabetes mellitus with diabetic polyneuropathy: Secondary | ICD-10-CM | POA: Diagnosis not present

## 2020-04-07 DIAGNOSIS — L859 Epidermal thickening, unspecified: Secondary | ICD-10-CM | POA: Diagnosis not present

## 2020-04-07 MED FILL — VYNDAMAX 61 MG CAPS: 61 | 30 days supply | Qty: 30 | Fill #7

## 2020-04-09 DIAGNOSIS — I2510 Atherosclerotic heart disease of native coronary artery without angina pectoris: Secondary | ICD-10-CM | POA: Diagnosis not present

## 2020-04-09 DIAGNOSIS — E1122 Type 2 diabetes mellitus with diabetic chronic kidney disease: Secondary | ICD-10-CM | POA: Diagnosis not present

## 2020-04-09 DIAGNOSIS — I5033 Acute on chronic diastolic (congestive) heart failure: Secondary | ICD-10-CM | POA: Diagnosis not present

## 2020-04-09 DIAGNOSIS — I252 Old myocardial infarction: Secondary | ICD-10-CM | POA: Diagnosis not present

## 2020-04-09 DIAGNOSIS — I130 Hypertensive heart and chronic kidney disease with heart failure and stage 1 through stage 4 chronic kidney disease, or unspecified chronic kidney disease: Secondary | ICD-10-CM | POA: Diagnosis not present

## 2020-04-09 DIAGNOSIS — I480 Paroxysmal atrial fibrillation: Secondary | ICD-10-CM | POA: Diagnosis not present

## 2020-04-14 ENCOUNTER — Other Ambulatory Visit (HOSPITAL_COMMUNITY): Payer: Self-pay | Admitting: Cardiology

## 2020-04-16 DIAGNOSIS — E1122 Type 2 diabetes mellitus with diabetic chronic kidney disease: Secondary | ICD-10-CM | POA: Diagnosis not present

## 2020-04-16 DIAGNOSIS — I5033 Acute on chronic diastolic (congestive) heart failure: Secondary | ICD-10-CM | POA: Diagnosis not present

## 2020-04-16 DIAGNOSIS — I252 Old myocardial infarction: Secondary | ICD-10-CM | POA: Diagnosis not present

## 2020-04-16 DIAGNOSIS — I130 Hypertensive heart and chronic kidney disease with heart failure and stage 1 through stage 4 chronic kidney disease, or unspecified chronic kidney disease: Secondary | ICD-10-CM | POA: Diagnosis not present

## 2020-04-16 DIAGNOSIS — I480 Paroxysmal atrial fibrillation: Secondary | ICD-10-CM | POA: Diagnosis not present

## 2020-04-16 DIAGNOSIS — I2510 Atherosclerotic heart disease of native coronary artery without angina pectoris: Secondary | ICD-10-CM | POA: Diagnosis not present

## 2020-04-17 NOTE — Progress Notes (Signed)
PCP:  [name], DO           Cardiologist:  Minus Breeding, MD Primary HF: Dr. Aundra Dubin  HPI:  [name] is a 69 y.o. female who has a history of AS s/p TAVR, chronic diastolic CHF, PAF, CKD III, HLD, HTN, OSA, and morbid obesity.   She was admitted from [facility] office2/5/20with marked volume overload. She diuresed 36 lbs with furosemide drip and was transitioned to torsemide 40 mg daily. Course complicated by AKI. RHC completedand showed elevated filling pressures and preserved cardiac output.PYP scan suggestive of TTR amyloid. Discharge weight was 208 pounds.   She developed a non-healing right heal ulcer followed by a podiatrist in [place].  She ended up having a peripheral angiogram in [place] with PTCA to the right popliteal and right AT.    In 9/20, she was admitted with fever and found to have Enterococcal bacteremia.  TEE was concerning for possible MV vegetation, bioprosthetic aortic valve looked ok.  She was treated with IV antibiotics, and repeat TEE in 10/20 did not show residual active endocarditis.    She is now s/p left distal SFA-tibioperoneal trunk bypass with left TMA done at [facility] in 2/21.   She was admitted in 7/21 with junctional bradycardia/hypotension. Carvedilol and amiodarone stopped.  She went into atrial fibrillation and ended up getting TEE-guided DCCV.  Amiodarone was restarted.  She was also treated for PNA and UTI.   She recently returned to clinic for followup of CHF, PAF and aortic valve disease.  She was not walking much s/p TMA, mainly in wheelchair but using walker with PT and using walker to get in bathroom.  No chest pain.  No dyspnea walking short distances with her walker.  No orthopnea/PND.  Weight was stable at home.  Mouth was dry and she had lost her sense of taste.  BP was very high in clinic.  She was only taking hydralazine 15 mg tid. No peripheral edema. No claudication.  She reported getting PT twice a week.    Today she returns to HF clinic for pharmacist medication titration/BP check. At last visit with MD, hydralazine was increased to 50 mg TID. Overall she is feeling well today. She says she feels good when her SBP is >135, but will start to feel sick if it drops to <120. She has been keeping a BP log at home. Her home logs indicate that her SBP normally ranges from 135-175. Her clinic BP was 164/60. Noted some dizziness when she bends down too quickly, which is not new. She takes torsemide 20 mg BID and has not needed any extra. No LEE. Occassionally will sleep in a recliner but this has decreased since increasing her torsemide back up to 20 mg BID. Taking all medications as prescribed and tolerating all medications.      HTN Medications: Hydralazine 50 mg TID  Has the patient been experiencing any side effects to the medications prescribed?  no  Does the patient have any problems obtaining medications due to transportation or finances?   No - has Medicare Part D  Understanding of regimen: good Understanding of indications: good Potential of compliance: good Patient understands to avoid NSAIDs. Patient understands to avoid decongestants.    Pertinent Lab Values (04/21/20): Marland Kitchen Serum creatinine 2.66, BUN 46, Potassium 4.1, Sodium 139  Vital Signs: . Weight: 205.8 lbs (last clinic weight: 208 lbs) . Blood pressure: 164/60  . Heart rate: 76   Assessment: 1.Chronic diastolic CHF: TEE in 5/32  showed EF 55-60%, mild LVH, stable bioprosthetic aortic valve s/p TAVR.PYP scan in 2/20 suggestive of transthyretin amyloidosis, genetic testing suggestive of wild type.  - NYHA class II-III symptoms, she is at baseline. She does not look volume overloaded on exam.  - Continue torsemide 20 mg BID.     - Would be a good candidate for Cardiomems.  Previously discussed with Dr. Aundra Dubin. 2. CKD stage 3:  BMET today.   3. CAD: Cath in 8/19 with moderate RCA and LCx disease, severe distal LAD disease,  medical management.  No chest pain. - No ASA given apixaban use.  - Continue pitivastatin 4 mg daily and Praluent.    - Continue fenofibrate and vascepa.  4. Atrial fibrillation: Paroxysmal. She is in NSR today on amiodarone.  - Continue Eliquis. CBC today.  - Continue amiodarone 200 mg daily. 5. HTN:BP elevated today, 164/60   - She is off carvedilol with junctional bradycardia at 7/21 admission.  - She cannot take amlodipine due to peripheral edema formation.  - Hold off on ACEI/ARB with CKD.  - Increase hydralazine to 75 mg TID    6. PAD: S/p PTCA to right popliteal and right AT in 7/20.  Now with left distal SFA-TP trunk bypass and left TMA in 2/21.   - She has followup with [facility].  7. 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  - She is on tafamidis, continue   8. 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   9. Enterococcal endocarditis: Involving mitral valve, 7/21 TEE showed possible old calcified vegetation.    10. Hyperlipidemia: Lipids improved on 2/21 panel, TGs still high but better.  Continue current regimen.  11. Mitral valve disease: TEE in 7/21 with mild-moderate MR, mild MS.   12. Thrush: Suspected, beefy red tongue with inability to taste.  Will have her take Nystatin suspension.     Plan: 1) Medication changes: Based on clinical presentation, vital signs and recent labs will increase hydralazine to 75 mg TID 2) Follow-up: 3 weeks with Dr. Rush Farmer, PharmD, BCPS, [facility], [facility] Pharmacist [phone]

## 2020-04-21 ENCOUNTER — Ambulatory Visit (INDEPENDENT_AMBULATORY_CARE_PROVIDER_SITE_OTHER): Payer: Medicare Other | Admitting: Family Medicine

## 2020-04-21 ENCOUNTER — Other Ambulatory Visit: Payer: Self-pay

## 2020-04-21 VITALS — BP 159/58 | HR 73 | Temp 96.9°F | Ht 67.0 in | Wt 208.0 lb

## 2020-04-21 DIAGNOSIS — Z794 Long term (current) use of insulin: Secondary | ICD-10-CM

## 2020-04-21 DIAGNOSIS — E1159 Type 2 diabetes mellitus with other circulatory complications: Secondary | ICD-10-CM

## 2020-04-21 DIAGNOSIS — I480 Paroxysmal atrial fibrillation: Secondary | ICD-10-CM | POA: Diagnosis not present

## 2020-04-21 DIAGNOSIS — E1122 Type 2 diabetes mellitus with diabetic chronic kidney disease: Secondary | ICD-10-CM | POA: Diagnosis not present

## 2020-04-21 DIAGNOSIS — I10 Essential (primary) hypertension: Secondary | ICD-10-CM | POA: Diagnosis not present

## 2020-04-21 DIAGNOSIS — Z9884 Bariatric surgery status: Secondary | ICD-10-CM | POA: Diagnosis not present

## 2020-04-21 DIAGNOSIS — Z952 Presence of prosthetic heart valve: Secondary | ICD-10-CM | POA: Diagnosis not present

## 2020-04-21 DIAGNOSIS — E785 Hyperlipidemia, unspecified: Secondary | ICD-10-CM

## 2020-04-21 DIAGNOSIS — Z789 Other specified health status: Secondary | ICD-10-CM | POA: Diagnosis not present

## 2020-04-21 DIAGNOSIS — Z7409 Other reduced mobility: Secondary | ICD-10-CM

## 2020-04-21 DIAGNOSIS — K149 Disease of tongue, unspecified: Secondary | ICD-10-CM

## 2020-04-21 DIAGNOSIS — E1169 Type 2 diabetes mellitus with other specified complication: Secondary | ICD-10-CM | POA: Diagnosis not present

## 2020-04-21 DIAGNOSIS — I739 Peripheral vascular disease, unspecified: Secondary | ICD-10-CM

## 2020-04-21 DIAGNOSIS — N184 Chronic kidney disease, stage 4 (severe): Secondary | ICD-10-CM | POA: Diagnosis not present

## 2020-04-21 DIAGNOSIS — R6889 Other general symptoms and signs: Secondary | ICD-10-CM | POA: Diagnosis not present

## 2020-04-21 LAB — BAYER DCA HB A1C WAIVED: HB A1C (BAYER DCA - WAIVED): 9.9 % — ABNORMAL HIGH (ref <7.0)

## 2020-04-21 NOTE — Progress Notes (Signed)
Subjective: CC: f/u DM PCP: [name], DO YEB:[id number] P Crenshaw is a 69 y.o. female presenting to clinic today for:  1. Type 2 Diabetes w/ HTN, HLD, CKD4:  Patient reports that overall she is feeling okay.  She has been in and out of the hospital over the last couple of months for her heart.  She continues to follow-up with Dr. Joelyn Oms for her kidneys.  She sees Dr. Chalmers Cater for her diabetes  Last eye exam: UTD Last foot exam: UTD Last A1c:  Lab Results  Component Value Date   HGBA1C 10.2 (H) 01/16/2020   Nephropathy screen indicated?: known CKD4 Last flu, zoster and/or pneumovax:  Immunization History  Administered Date(s) Administered   Fluad Quad(high Dose 65+) 05/23/2019   Influenza Split 05/16/2011   Influenza Whole 05/15/2010   Influenza, High Dose Seasonal PF 05/09/2016, 07/10/2017, 05/09/2018, 05/23/2019   Influenza,inj,Quad PF,6+ Mos 05/22/2013, 06/03/2014, 05/14/2015   Influenza-Unspecified 05/09/2016, 07/10/2017, 05/09/2018   Moderna SARS-COVID-2 Vaccination 10/19/2019, 11/16/2019   Pneumococcal Conjugate-13 05/09/2016, 08/07/2017   Pneumococcal Polysaccharide-23 01/21/2014, 07/01/2019   Pneumococcal-Unspecified 01/21/2014, 07/01/2019   Tdap 04/26/2011   2. Tongue irritation Patient has had ongoing red, inflamed tongue for the last couple of weeks since cardioversion.  She is status post nystatin but this is not helped.   ROS: Per HPI  Allergies  Allergen Reactions   Amlodipine Swelling    Ankle swelling   Atorvastatin Other (See Comments)    Myalgias    Crestor [Rosuvastatin Calcium] Other (See Comments)    Stiffness and back pain   Ezetimibe-Simvastatin Other (See Comments)    Leg cramps   Sitagliptin Other (See Comments)   Statins Other (See Comments)    Myalgias   Celebrex [Celecoxib] Rash   Levemir [Insulin Detemir] Rash   Past Medical History:  Diagnosis Date   Atrial fibrillation ([place])    a. maintaining sinus s/p  DCCV, on Eliquis   CAD (coronary artery disease)    a. Nonobstructive by cath 2006 - 25% LAD, 25-30% after 1st diag, distal 25% PDA, minor irreg LCx. b. Normal nuc 2011.   CKD (chronic kidney disease), stage III    Cystocele    Difficult intubation 09-07-2012   big neck trouble with intubation parotid surgery"takes little med to sedate"   Esophageal stricture    GERD (gastroesophageal reflux disease)    Hiatal hernia    a. 07/2013: HH with small stricture holding up barium tablet (concides with patient's sx of food sticking).   History of kidney stones    Hyperlipidemia    Hypertension    IBS (irritable bowel syndrome)    Memory difficulty 11/26/2015   Morbid obesity ([place])    a. hx of gastric bypass (sleeve gastrectomy 2015)   Non-STEMI (non-ST elevated myocardial infarction) (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   Pericardial effusion    a. Mod by echo 2012 at time of PNA. b. Echo 07/2012: small pericardial effusion vs fat.   Pressure ulcer 04/16/2019   right foot   S/P TAVR (transcatheter aortic valve replacement)    Severe aortic stenosis    Sleep apnea     "have mask; don't use it" (02/14/2018)   TIA (transient ischemic attack) 10/2014; 06/2016   "some memory issues since; daughter says my speech is sometimes different" (02/14/2018)   Type II diabetes mellitus ([place])     Current Outpatient Medications:    ACCU-CHEK AVIVA  PLUS test strip, USE TO CHECK BLOOD SUGAR UP TO TWICE DAILY OR AS INSTRUCTED, Disp: 100 each, Rfl: 2   amiodarone (PACERONE) 200 MG tablet, Take 1 tablet (200 mg) twice daily for 2 weeks then take 1 tablet (200 mg) once daily, Disp: 60 tablet, Rfl: 0   B-D INS SYR ULTRAFINE 1CC/31G 31G X 5/16" 1 ML MISC, USE TO INJECT INSULIN TWICE A DAY AS INSTRUCTED (Patient taking differently: 1 Stick by Other route 3 (three) times daily before meals. ), Disp: 100 each, Rfl: 2   Calcium Carbonate  (CALCIUM 600 PO), Take 1 tablet by mouth daily., Disp: , Rfl:    Cholecalciferol (VITAMIN D3 PO), Take 1 tablet by mouth in the morning and at bedtime. , Disp: , Rfl:    Cyanocobalamin (VITAMIN B-12 PO), Take 1 tablet by mouth daily. 2 gummies by mouth daily , Disp: , Rfl:    docusate sodium (COLACE) 50 MG capsule, Take 2 capsules (100 mg total) by mouth 2 (two) times daily., Disp: 10 capsule, Rfl: 0   ELIQUIS 5 MG TABS tablet, TAKE 1 TABLET BY MOUTH TWICE A DAY, Disp: 180 tablet, Rfl: 1   fenofibrate (TRICOR) 145 MG tablet, TAKE 1 TABLET BY MOUTH EVERY DAY, Disp: 90 tablet, Rfl: 3   hydrALAZINE (APRESOLINE) 50 MG tablet, Take 1 tablet (50 mg total) by mouth every 8 (eight) hours., Disp: 90 tablet, Rfl: 11   insulin NPH Human (NOVOLIN N) 100 UNIT/ML injection, Inject 20-60 Units into the skin 2 (two) times daily before a meal. 60 units in the am 60 units at lunch and 20 units for supper - on a sliding scale, Disp: , Rfl:    Insulin Pen Needle 32G X 4 MM MISC, Use to inject insulin with insulin pen, Disp: 100 each, Rfl: 4   loratadine (CLARITIN) 10 MG tablet, Take 10 mg by mouth daily. , Disp: , Rfl:    meclizine (ANTIVERT) 12.5 MG tablet, Take 1 tablet (12.5 mg total) by mouth 3 (three) times daily as needed for dizziness., Disp: 30 tablet, Rfl: 5   Multiple Vitamins-Minerals (MULTIVITAMIN GUMMIES ADULT PO), Taking two gummies by mouth daily, Disp: , Rfl:    NOVOLIN R RELION 100 UNIT/ML injection, Inject 20-30 Units into the skin 3 (three) times daily before meals. Per sliding scale, Disp: , Rfl:    nutrition supplement, JUVEN, (JUVEN) PACK, Take 1 packet by mouth 2 (two) times daily between meals., Disp: 60 packet, Rfl: 0   ondansetron (ZOFRAN ODT) 4 MG disintegrating tablet, Take 1 tablet (4 mg total) by mouth every 8 (eight) hours as needed for nausea., Disp: 10 tablet, Rfl: 0   PRALUENT 75 MG/ML SOAJ, INJECT 75 MG INTO THE SKIN EVERY 14 (FOURTEEN) DAYS., Disp: 2 pen, Rfl: 6    RELION INSULIN SYRINGE 31G X 15/64" 1 ML MISC, Inject 1 Syringe into the skin 3 (three) times daily., Disp: , Rfl:    Tafamidis (VYNDAMAX) 61 MG CAPS, Take 61 mg by mouth daily., Disp: 30 capsule, Rfl: 11   torsemide (DEMADEX) 20 MG tablet, Take 1 tablet (20 mg total) by mouth 2 (two) times daily., Disp: 60 tablet, Rfl: 0   VASCEPA 1 g capsule, TAKE 2 CAPSULES (2 G TOTAL) BY MOUTH 2 (TWO) TIMES A DAY., Disp: 120 capsule, Rfl: 6 Social History   Socioeconomic History   Marital status: Married    Spouse name: Not on file   Number of children: 3   Years of education: some college  Highest education level: Some college, no degree  Occupational History   Occupation: retired    Fish farm manager: [employer]  Tobacco Use   Smoking status: Never Smoker   Smokeless tobacco: Never Used  Scientific laboratory technician Use: Never used  Substance and Sexual Activity   Alcohol use: Not Currently    Alcohol/week: 0.0 standard drinks    Comment:  "mixed drink q few years"   Drug use: Never   Sexual activity: Yes    Birth control/protection: Post-menopausal  Other Topics Concern   Not on file  Social History Narrative   Married   Patient is right handed.   Patient rarely drinks caffeine.   Lives at home with husband in two story home. Husband washes laundry in the basement. They have 3 daughters and 7 grandchildren.    Social Determinants of Health   Financial Resource Strain:    Difficulty of Paying Living Expenses: Not on file  Food Insecurity:    Worried About Charity fundraiser in the Last Year: Not on file   [employer] of Food in the Last Year: Not on file  Transportation Needs:    Lack of Transportation (Medical): Not on file   Lack of Transportation (Non-Medical): Not on file  Physical Activity:    Days of Exercise per Week: Not on file   Minutes of Exercise per Session: Not on file  Stress:    Feeling of Stress : Not on file  Social Connections:    Frequency of  Communication with Friends and Family: Not on file   Frequency of Social Gatherings with Friends and Family: Not on file   Attends Religious Services: Not on file   Active Member of Clubs or Organizations: Not on file   Attends Archivist Meetings: Not on file   Marital Status: Not on file  Intimate Partner Violence:    Fear of Current or Ex-Partner: Not on file   Emotionally Abused: Not on file   Physically Abused: Not on file   Sexually Abused: Not on file   Family History  Problem Relation Age of Onset   Heart failure Mother    Hypertension Mother    Skin cancer Mother    Colitis Mother    Diabetes Mother    Kidney Stones Mother    Stroke Mother    Heart disease Father    Colon polyps Father    Diabetes Father    Heart failure Father    Heart attack Sister    Diabetes Other    Stroke Maternal Grandfather    Diabetes Maternal Grandfather    Colon cancer Neg Hx     Objective: Office vital signs reviewed. BP (!) 159/58    Pulse 73    Temp (!) 96.9 F (36.1 C) (Temporal)    Ht 5\' 7"  (1.702 m)    Wt 208 lb (94.3 kg)    SpO2 96%    BMI 32.58 kg/m   Physical Examination:  General: Awake, alert, chronically ill appearing female, No acute distress HEENT: Normal.  Mildly erythematous tongue.  No exophthalmos or goiter. Cardio: regular rate and rhythm, S1S2 heard, no murmurs appreciated Pulm: clear to auscultation bilaterally, no wheezes, rhonchi or rales; normal work of breathing on room air Extremities: warm, well perfused, No edema, cyanosis or clubbing; +1 pedal pulses bilaterally MSK: arrives in wheelchair  Assessment/ Plan: 69 y.o. female   1. Type 2 diabetes mellitus with stage 4 chronic kidney disease, with long-term  current use of insulin ([place]) Not controlled but A1c down some at 9.9.  Continue to follow-up with endocrinology as scheduled.  CCed to endocrinology - Bayer DCA Hb A1c Waived - Renal Function Panel - CBC - VITAMIN D  25 Hydroxy (Vit-D Deficiency, Fractures) - Referral to Chronic Care Management Services  2. Hyperlipidemia associated with type 2 diabetes mellitus ([place]) Continue current medication - Referral to Chronic Care Management Services  3. Hypertension associated with diabetes [facility]) Not at goal - Renal Function Panel - Referral to Chronic Care Management Services  4. Paroxysmal atrial fibrillation ([facility]) Regular sinus rhythm today - TSH - CBC - Referral to Chronic Care Management Services  5. Peripheral artery disease ([place]) - Referral to Chronic Care Management Services  6. S/P TAVR (transcatheter aortic valve replacement) - Referral to Chronic Care Management Services  7. Tongue irritation Check for vitamin B12 and thyroid mediated etiology. - Vitamin B12 - TSH  8. Impaired mobility and activities of daily living - Vitamin B12   No orders of the defined types were placed in this encounter.  No orders of the defined types were placed in this encounter.    [name], DO [place] [phone]

## 2020-04-22 ENCOUNTER — Other Ambulatory Visit: Payer: Self-pay

## 2020-04-22 ENCOUNTER — Telehealth: Payer: Self-pay

## 2020-04-22 DIAGNOSIS — I252 Old myocardial infarction: Secondary | ICD-10-CM | POA: Diagnosis not present

## 2020-04-22 DIAGNOSIS — I130 Hypertensive heart and chronic kidney disease with heart failure and stage 1 through stage 4 chronic kidney disease, or unspecified chronic kidney disease: Secondary | ICD-10-CM | POA: Diagnosis not present

## 2020-04-22 DIAGNOSIS — I480 Paroxysmal atrial fibrillation: Secondary | ICD-10-CM | POA: Diagnosis not present

## 2020-04-22 DIAGNOSIS — R7989 Other specified abnormal findings of blood chemistry: Secondary | ICD-10-CM

## 2020-04-22 DIAGNOSIS — I5033 Acute on chronic diastolic (congestive) heart failure: Secondary | ICD-10-CM | POA: Diagnosis not present

## 2020-04-22 DIAGNOSIS — E1122 Type 2 diabetes mellitus with diabetic chronic kidney disease: Secondary | ICD-10-CM | POA: Diagnosis not present

## 2020-04-22 DIAGNOSIS — I2510 Atherosclerotic heart disease of native coronary artery without angina pectoris: Secondary | ICD-10-CM | POA: Diagnosis not present

## 2020-04-22 DIAGNOSIS — D582 Other hemoglobinopathies: Secondary | ICD-10-CM

## 2020-04-22 LAB — RENAL FUNCTION PANEL
Albumin: 3.8 g/dL (ref 3.8–4.8)
BUN/Creatinine Ratio: 17 (ref 12–28)
BUN: 46 mg/dL — ABNORMAL HIGH (ref 8–27)
CO2: 27 mmol/L (ref 20–29)
Calcium: 8.9 mg/dL (ref 8.7–10.3)
Chloride: 96 mmol/L (ref 96–106)
Creatinine, Ser: 2.66 mg/dL — ABNORMAL HIGH (ref 0.57–1.00)
GFR calc Af Amer: 20 mL/min/{1.73_m2} — ABNORMAL LOW (ref >59)
GFR calc non Af Amer: 18 mL/min/{1.73_m2} — ABNORMAL LOW (ref >59)
Glucose: 386 mg/dL — ABNORMAL HIGH (ref 65–99)
Phosphorus: 3.8 mg/dL (ref 3.0–4.3)
Potassium: 4.1 mmol/L (ref 3.5–5.2)
Sodium: 139 mmol/L (ref 134–144)

## 2020-04-22 LAB — CBC
Hematocrit: 31.6 % — ABNORMAL LOW (ref 34.0–46.6)
Hemoglobin: 9.1 g/dL — ABNORMAL LOW (ref 11.1–15.9)
MCH: 21.5 pg — ABNORMAL LOW (ref 26.6–33.0)
MCHC: 28.8 g/dL — ABNORMAL LOW (ref 31.5–35.7)
MCV: 75 fL — ABNORMAL LOW (ref 79–97)
Platelets: 452 10*3/uL — ABNORMAL HIGH (ref 150–450)
RBC: 4.24 x10E6/uL (ref 3.77–5.28)
RDW: 15.8 % — ABNORMAL HIGH (ref 11.7–15.4)
WBC: 7.9 10*3/uL (ref 3.4–10.8)

## 2020-04-22 LAB — VITAMIN B12: Vitamin B-12: 548 pg/mL (ref 232–1245)

## 2020-04-22 LAB — TSH: TSH: 1.21 u[IU]/mL (ref 0.450–4.500)

## 2020-04-22 LAB — VITAMIN D 25 HYDROXY (VIT D DEFICIENCY, FRACTURES): Vit D, 25-Hydroxy: 32 ng/mL (ref 30.0–100.0)

## 2020-04-22 NOTE — Chronic Care Management (AMB) (Signed)
  Chronic Care Management   Note  04/22/2020 Name: AMIYAH SHRYOCK MRN: [id number] DOB: 25-Dec-1950  Meri P Dikes is a 69 y.o. year old female who is a primary care patient of Ronnie Doss M, DO. I reached out to [employer] by phone today in response to a referral sent by Ms. Ailie P Maqueda's PCP, Dr. Lajuana Ripple      Ms. Alfred was given information about Chronic Care Management services today including:  1. CCM service includes personalized support from designated clinical staff supervised by her physician, including individualized plan of care and coordination with other care providers 2. 24/7 contact phone numbers for assistance for urgent and routine care needs. 3. Service will only be billed when office clinical staff spend 20 minutes or more in a month to coordinate care. 4. Only one practitioner may furnish and bill the service in a calendar month. 5. The patient may stop CCM services at any time (effective at the end of the month) by phone call to the office staff. 6. The patient will be responsible for cost sharing (co-pay) of up to 20% of the service fee (after annual deductible is met).  Patient agreed to services and verbal consent obtained.   Follow up plan: Telephone appointment with care management team member scheduled for:05/28/2020  Noreene Larsson, [address], [place], [place] [address] Direct Dial: [phone] [email or web address] Website: [email or web address]

## 2020-04-23 DIAGNOSIS — Z48812 Encounter for surgical aftercare following surgery on the circulatory system: Secondary | ICD-10-CM | POA: Diagnosis not present

## 2020-04-23 DIAGNOSIS — E1151 Type 2 diabetes mellitus with diabetic peripheral angiopathy without gangrene: Secondary | ICD-10-CM | POA: Diagnosis not present

## 2020-04-24 ENCOUNTER — Telehealth: Payer: Self-pay | Admitting: Family Medicine

## 2020-04-24 NOTE — Telephone Encounter (Signed)
  Prescription Request  04/24/2020  What is the name of the medication or equipment? Pt said she was seen for numb tongue and dr didn't call in rx  Have you contacted your pharmacy to request a refill? (if applicable) yes  Which [facility] you [facility]   Patient notified that their request is being sent to the clinical staff for review and that they should receive a response within 2 business days.

## 2020-04-24 NOTE — Telephone Encounter (Signed)
Please advise from 04/21/20 visit

## 2020-04-25 NOTE — Telephone Encounter (Signed)
We were looking for metabolic etiology but her other dr called in nystatin suspension.  Unlikely to be fungal since did not respond to nystatin.  Recommend biotene mouth rinses, adequate hydration.

## 2020-04-26 ENCOUNTER — Other Ambulatory Visit: Payer: Self-pay | Admitting: Cardiology

## 2020-04-27 ENCOUNTER — Other Ambulatory Visit: Payer: Self-pay

## 2020-04-27 ENCOUNTER — Ambulatory Visit (HOSPITAL_COMMUNITY)
Admission: RE | Admit: 2020-04-27 | Discharge: 2020-04-27 | Disposition: A | Discharge status: Home / Self Care | Payer: Medicare Other | Source: Ambulatory Visit | Attending: Internal Medicine | Admitting: Internal Medicine

## 2020-04-27 VITALS — BP 164/60 | HR 76 | Wt 205.8 lb

## 2020-04-27 DIAGNOSIS — N183 Chronic kidney disease, stage 3 unspecified: Secondary | ICD-10-CM | POA: Insufficient documentation

## 2020-04-27 DIAGNOSIS — G4733 Obstructive sleep apnea (adult) (pediatric): Secondary | ICD-10-CM | POA: Diagnosis not present

## 2020-04-27 DIAGNOSIS — I130 Hypertensive heart and chronic kidney disease with heart failure and stage 1 through stage 4 chronic kidney disease, or unspecified chronic kidney disease: Secondary | ICD-10-CM | POA: Diagnosis not present

## 2020-04-27 DIAGNOSIS — I739 Peripheral vascular disease, unspecified: Secondary | ICD-10-CM | POA: Insufficient documentation

## 2020-04-27 DIAGNOSIS — Z9861 Coronary angioplasty status: Secondary | ICD-10-CM | POA: Diagnosis not present

## 2020-04-27 DIAGNOSIS — Z953 Presence of xenogenic heart valve: Secondary | ICD-10-CM | POA: Diagnosis not present

## 2020-04-27 DIAGNOSIS — E785 Hyperlipidemia, unspecified: Secondary | ICD-10-CM | POA: Insufficient documentation

## 2020-04-27 DIAGNOSIS — I5032 Chronic diastolic (congestive) heart failure: Secondary | ICD-10-CM | POA: Insufficient documentation

## 2020-04-27 DIAGNOSIS — I480 Paroxysmal atrial fibrillation: Secondary | ICD-10-CM | POA: Diagnosis not present

## 2020-04-27 DIAGNOSIS — E854 Organ-limited amyloidosis: Secondary | ICD-10-CM | POA: Diagnosis not present

## 2020-04-27 DIAGNOSIS — I43 Cardiomyopathy in diseases classified elsewhere: Secondary | ICD-10-CM | POA: Diagnosis not present

## 2020-04-27 DIAGNOSIS — Z95820 Peripheral vascular angioplasty status with implants and grafts: Secondary | ICD-10-CM | POA: Diagnosis not present

## 2020-04-27 DIAGNOSIS — I2510 Atherosclerotic heart disease of native coronary artery without angina pectoris: Secondary | ICD-10-CM | POA: Diagnosis not present

## 2020-04-27 MED ORDER — HYDRALAZINE HCL 50 MG PO TABS: 75.0000 mg | ORAL_TABLET | Freq: Three times a day (TID) | ORAL | 11 refills | Status: DC

## 2020-04-27 NOTE — Patient Instructions (Signed)
It was a pleasure seeing you today!  MEDICATIONS: -We are changing your medications today -Increase hydralazine to 75 mg (1.5 tablets) three times daily.  -Call if you have questions about your medications.  NEXT APPOINTMENT: Return to clinic in 3 weeks with Dr. Aundra Dubin.  In general, to take care of your heart failure: -Limit your fluid intake to 2 Liters (half-gallon) per day.   -Limit your salt intake to ideally 2-3 grams (2000-3000 mg) per day. -Weigh yourself daily and record, and bring that "weight diary" to your next appointment.  (Weight gain of 2-3 pounds in 1 day typically means fluid weight.) -The medications for your heart are to help your heart and help you live longer.   -Please contact us before stopping any of your heart medications.  Call the clinic at [phone] with questions or to reschedule future appointments.

## 2020-04-27 NOTE — Telephone Encounter (Signed)
Patient aware and verbalized understanding. °

## 2020-04-28 DIAGNOSIS — E1151 Type 2 diabetes mellitus with diabetic peripheral angiopathy without gangrene: Secondary | ICD-10-CM | POA: Diagnosis not present

## 2020-04-28 DIAGNOSIS — Z48812 Encounter for surgical aftercare following surgery on the circulatory system: Secondary | ICD-10-CM | POA: Diagnosis not present

## 2020-04-29 ENCOUNTER — Telehealth: Payer: Self-pay | Admitting: Family Medicine

## 2020-04-29 ENCOUNTER — Other Ambulatory Visit: Payer: Self-pay | Admitting: *Deleted

## 2020-04-29 ENCOUNTER — Other Ambulatory Visit: Payer: Medicare Other

## 2020-04-29 ENCOUNTER — Other Ambulatory Visit: Payer: Self-pay

## 2020-04-29 DIAGNOSIS — D582 Other hemoglobinopathies: Secondary | ICD-10-CM

## 2020-04-29 LAB — HEMOGLOBIN, FINGERSTICK: Hemoglobin: 8.9 g/dL — ABNORMAL LOW (ref 11.1–15.9)

## 2020-04-30 ENCOUNTER — Other Ambulatory Visit: Payer: Medicare Other

## 2020-04-30 ENCOUNTER — Other Ambulatory Visit: Payer: Self-pay | Admitting: *Deleted

## 2020-04-30 DIAGNOSIS — I130 Hypertensive heart and chronic kidney disease with heart failure and stage 1 through stage 4 chronic kidney disease, or unspecified chronic kidney disease: Secondary | ICD-10-CM | POA: Diagnosis not present

## 2020-04-30 DIAGNOSIS — I252 Old myocardial infarction: Secondary | ICD-10-CM | POA: Diagnosis not present

## 2020-04-30 DIAGNOSIS — D582 Other hemoglobinopathies: Secondary | ICD-10-CM

## 2020-04-30 DIAGNOSIS — I5033 Acute on chronic diastolic (congestive) heart failure: Secondary | ICD-10-CM | POA: Diagnosis not present

## 2020-04-30 DIAGNOSIS — I2510 Atherosclerotic heart disease of native coronary artery without angina pectoris: Secondary | ICD-10-CM | POA: Diagnosis not present

## 2020-04-30 DIAGNOSIS — E1122 Type 2 diabetes mellitus with diabetic chronic kidney disease: Secondary | ICD-10-CM | POA: Diagnosis not present

## 2020-04-30 DIAGNOSIS — I480 Paroxysmal atrial fibrillation: Secondary | ICD-10-CM | POA: Diagnosis not present

## 2020-04-30 NOTE — Addendum Note (Signed)
Addended by: Verlin Dike on: 04/30/2020 04:53 PM   Modules accepted: Orders

## 2020-04-30 NOTE — Progress Notes (Signed)
f °

## 2020-05-03 LAB — FECAL OCCULT BLOOD, IMMUNOCHEMICAL: Fecal Occult Bld: POSITIVE — AB

## 2020-05-04 ENCOUNTER — Telehealth: Payer: Self-pay | Admitting: Family Medicine

## 2020-05-04 ENCOUNTER — Other Ambulatory Visit: Payer: Self-pay

## 2020-05-04 DIAGNOSIS — K921 Melena: Secondary | ICD-10-CM

## 2020-05-04 DIAGNOSIS — D72818 Other decreased white blood cell count: Secondary | ICD-10-CM

## 2020-05-04 NOTE — Telephone Encounter (Signed)
REFERRAL REQUEST Telephone Note  Have you been seen at our office for this problem? Yes (Advise that they may need an appointment with their PCP before a referral can be done)  Reason for Referral: Gastro Doctor-blood in stool Referral discussed with patient: Yes Best contact number of patient for referral team:   [phone] Has patient been seen by a specialist for this issue before: Yes Patient provider preference for referral: Dr Fuller Plan Patient location preference for referral: [facility] across from [place]   Patient notified that referrals can take up to a week or longer to process. If they haven't heard anything within a week they should call back and speak with the referral department.   Gottschalk's pt.  Please call pt.

## 2020-05-04 NOTE — Progress Notes (Signed)
Referral placed per note in chart from Dr. Warrick Parisian

## 2020-05-06 ENCOUNTER — Encounter: Payer: Self-pay | Admitting: Gastroenterology

## 2020-05-08 ENCOUNTER — Telehealth: Payer: Self-pay | Admitting: Family Medicine

## 2020-05-08 DIAGNOSIS — K921 Melena: Secondary | ICD-10-CM

## 2020-05-08 NOTE — Telephone Encounter (Signed)
Yes needs to be rechecked

## 2020-05-08 NOTE — Telephone Encounter (Signed)
Future order placed and pt is aware to come in and have her hgb rechecked.

## 2020-05-12 ENCOUNTER — Other Ambulatory Visit: Payer: Self-pay

## 2020-05-12 ENCOUNTER — Other Ambulatory Visit: Payer: Medicare Other

## 2020-05-12 DIAGNOSIS — K921 Melena: Secondary | ICD-10-CM

## 2020-05-12 LAB — HEMOGLOBIN, FINGERSTICK: Hemoglobin: 9.3 g/dL — ABNORMAL LOW (ref 11.1–15.9)

## 2020-05-13 DIAGNOSIS — Z961 Presence of intraocular lens: Secondary | ICD-10-CM | POA: Diagnosis not present

## 2020-05-13 DIAGNOSIS — H524 Presbyopia: Secondary | ICD-10-CM | POA: Diagnosis not present

## 2020-05-13 DIAGNOSIS — D3131 Benign neoplasm of right choroid: Secondary | ICD-10-CM | POA: Diagnosis not present

## 2020-05-13 DIAGNOSIS — H52203 Unspecified astigmatism, bilateral: Secondary | ICD-10-CM | POA: Diagnosis not present

## 2020-05-13 DIAGNOSIS — E119 Type 2 diabetes mellitus without complications: Secondary | ICD-10-CM | POA: Diagnosis not present

## 2020-05-13 DIAGNOSIS — H5213 Myopia, bilateral: Secondary | ICD-10-CM | POA: Diagnosis not present

## 2020-05-14 MED FILL — VYNDAMAX 61 MG CAPS: 61 | 30 days supply | Qty: 30 | Fill #8

## 2020-05-19 ENCOUNTER — Ambulatory Visit (HOSPITAL_COMMUNITY)
Admission: RE | Admit: 2020-05-19 | Discharge: 2020-05-19 | Disposition: A | Discharge status: Home / Self Care | Payer: Medicare Other | Source: Ambulatory Visit | Attending: Cardiology | Admitting: Cardiology

## 2020-05-19 ENCOUNTER — Other Ambulatory Visit: Payer: Self-pay

## 2020-05-19 VITALS — BP 144/62 | HR 71 | Ht 67.0 in | Wt 199.0 lb

## 2020-05-19 DIAGNOSIS — E1122 Type 2 diabetes mellitus with diabetic chronic kidney disease: Secondary | ICD-10-CM | POA: Diagnosis not present

## 2020-05-19 DIAGNOSIS — I5022 Chronic systolic (congestive) heart failure: Secondary | ICD-10-CM

## 2020-05-19 DIAGNOSIS — Z79899 Other long term (current) drug therapy: Secondary | ICD-10-CM | POA: Diagnosis not present

## 2020-05-19 DIAGNOSIS — R0602 Shortness of breath: Secondary | ICD-10-CM | POA: Insufficient documentation

## 2020-05-19 DIAGNOSIS — Z9884 Bariatric surgery status: Secondary | ICD-10-CM | POA: Insufficient documentation

## 2020-05-19 DIAGNOSIS — Z8249 Family history of ischemic heart disease and other diseases of the circulatory system: Secondary | ICD-10-CM | POA: Diagnosis not present

## 2020-05-19 DIAGNOSIS — Z6831 Body mass index (BMI) 31.0-31.9, adult: Secondary | ICD-10-CM | POA: Diagnosis not present

## 2020-05-19 DIAGNOSIS — I480 Paroxysmal atrial fibrillation: Secondary | ICD-10-CM

## 2020-05-19 DIAGNOSIS — Z953 Presence of xenogenic heart valve: Secondary | ICD-10-CM | POA: Insufficient documentation

## 2020-05-19 DIAGNOSIS — G4733 Obstructive sleep apnea (adult) (pediatric): Secondary | ICD-10-CM | POA: Diagnosis not present

## 2020-05-19 DIAGNOSIS — Z794 Long term (current) use of insulin: Secondary | ICD-10-CM | POA: Insufficient documentation

## 2020-05-19 DIAGNOSIS — Z7901 Long term (current) use of anticoagulants: Secondary | ICD-10-CM | POA: Insufficient documentation

## 2020-05-19 DIAGNOSIS — I2510 Atherosclerotic heart disease of native coronary artery without angina pectoris: Secondary | ICD-10-CM | POA: Insufficient documentation

## 2020-05-19 DIAGNOSIS — I059 Rheumatic mitral valve disease, unspecified: Secondary | ICD-10-CM | POA: Insufficient documentation

## 2020-05-19 DIAGNOSIS — I5032 Chronic diastolic (congestive) heart failure: Secondary | ICD-10-CM

## 2020-05-19 DIAGNOSIS — N183 Chronic kidney disease, stage 3 unspecified: Secondary | ICD-10-CM | POA: Diagnosis not present

## 2020-05-19 DIAGNOSIS — E785 Hyperlipidemia, unspecified: Secondary | ICD-10-CM | POA: Insufficient documentation

## 2020-05-19 DIAGNOSIS — K219 Gastro-esophageal reflux disease without esophagitis: Secondary | ICD-10-CM | POA: Insufficient documentation

## 2020-05-19 DIAGNOSIS — I130 Hypertensive heart and chronic kidney disease with heart failure and stage 1 through stage 4 chronic kidney disease, or unspecified chronic kidney disease: Secondary | ICD-10-CM | POA: Insufficient documentation

## 2020-05-19 LAB — COMPREHENSIVE METABOLIC PANEL
ALT: 85 U/L — ABNORMAL HIGH (ref 0–44)
AST: 85 U/L — ABNORMAL HIGH (ref 15–41)
Albumin: 3.1 g/dL — ABNORMAL LOW (ref 3.5–5.0)
Alkaline Phosphatase: 37 U/L — ABNORMAL LOW (ref 38–126)
Anion gap: 14 (ref 5–15)
BUN: 47 mg/dL — ABNORMAL HIGH (ref 8–23)
CO2: 28 mmol/L (ref 22–32)
Calcium: 8.8 mg/dL — ABNORMAL LOW (ref 8.9–10.3)
Chloride: 98 mmol/L (ref 98–111)
Creatinine, Ser: 3.19 mg/dL — ABNORMAL HIGH (ref 0.44–1.00)
GFR calc non Af Amer: 14 mL/min — ABNORMAL LOW (ref >60)
Glucose, Bld: 183 mg/dL — ABNORMAL HIGH (ref 70–99)
Potassium: 3.6 mmol/L (ref 3.5–5.1)
Sodium: 140 mmol/L (ref 135–145)
Total Bilirubin: 0.7 mg/dL (ref 0.3–1.2)
Total Protein: 6.3 g/dL — ABNORMAL LOW (ref 6.5–8.1)

## 2020-05-19 LAB — CBC
HCT: 31.4 % — ABNORMAL LOW (ref 36.0–46.0)
Hemoglobin: 8.8 g/dL — ABNORMAL LOW (ref 12.0–15.0)
MCH: 20.2 pg — ABNORMAL LOW (ref 26.0–34.0)
MCHC: 28 g/dL — ABNORMAL LOW (ref 30.0–36.0)
MCV: 72.2 fL — ABNORMAL LOW (ref 80.0–100.0)
Platelets: 423 10*3/uL — ABNORMAL HIGH (ref 150–400)
RBC: 4.35 MIL/uL (ref 3.87–5.11)
RDW: 17.6 % — ABNORMAL HIGH (ref 11.5–15.5)
WBC: 6.5 10*3/uL (ref 4.0–10.5)
nRBC: 0 % (ref 0.0–0.2)

## 2020-05-19 MED ORDER — PANTOPRAZOLE SODIUM 40 MG PO TBEC
40.0000 mg | DELAYED_RELEASE_TABLET | Freq: Every day | ORAL | 11 refills | Status: AC
Start: 2020-05-19 — End: ?

## 2020-05-19 NOTE — Patient Instructions (Signed)
START Protonix 40mg  Daily  Labs done today, your results will be available in MyChart, we will contact you for abnormal readings.  Your physician recommends that you return for a follow up appointment with our APP clinic in about 6 weeks  If you have any questions or concerns before your next appointment please send [place] a message through [place] or call our office at [phone].    TO LEAVE A MESSAGE FOR THE NURSE SELECT OPTION 2, PLEASE LEAVE A MESSAGE INCLUDING: . YOUR NAME . DATE OF BIRTH . CALL BACK NUMBER . REASON FOR CALL**this is important as we prioritize the call backs  YOU WILL RECEIVE A CALL BACK THE SAME DAY AS LONG AS YOU CALL BEFORE 4:00 PM

## 2020-05-20 NOTE — Progress Notes (Signed)
Date:  05/20/2020   ID:  TERENCE GOOGE, DOB [birth year], MRN [id number]    Provider location: [place] [facility] Type of Visit: Established patient   PCP:  [name], DO  Cardiologist:  Minus Breeding, MD Primary HF: Dr. Aundra Dubin  Chief Complaint: Shortness of breath   History of Present Illness: [name] is a 69 y.o. female who has a history of AS s/p TAVR, chronic diastolic CHF, PAF, CKD III, HLD, HTN, OSA, and morbid obesity.   She was admitted from [facility] office 09/19/18 with marked volume overload. She diuresed 36 lbs with lasix drip and was transitioned to torsemide 40 mg daily. Course complicated by AKI. RHC completed and showed elevated filling pressures and preserved cardiac output.  PYP scan suggestive of TTR amyloid. Discharge weight was 208 pounds.   She developed a non-healing right heal ulcer followed by a podiatrist in [place].  She ended up having a peripheral angiogram in [place] with PTCA to the right popliteal and right AT.    In 9/20, she was admitted with fever and found to have Enterococcal bacteremia.  TEE was concerning for possible MV vegetation, bioprosthetic aortic valve looked ok.  She was treated with IV antibiotics, and repeat TEE in 10/20 did not show residual active endocarditis.    She is now s/p left distal SFA-tibioperoneal trunk bypass with left TMA done at [facility] in 2/21.   She was admitted in 7/21 junctional bradycardia/hypotension.  Coreg and amiodarone stopped.  She went into atrial fibrillation and ended up getting TEE-guided DCCV.  Amiodarone was restarted.  She was also treated for PNA and UTI.   She returns today for followup of CHF, PAF, aortic valve disease.  She is not walking much s/p TMA, mainly in wheelchair outside the house but using walker more around the house.  Weight is stable.  No dyspnea walking short distances.  No exertional chest pain.  Main complaint today is burning in her  esophagus after eating.  This has been going on for a few weeks.   ECG (personally reviewed): NSR, septal Qs, lateral TWIs  Labs (11/19): LDL 115 Labs (2/20): K 4.4, creatinine 2.39, myeloma panel negative Labs (7/20): LDL 150, triglycerides 422, K 4.1, creatinine 2.97 => 2.06 Labs (11/20): K 3.8, creatinine 2.48 Labs (12/20): K 4.4, creatinine 2.75, LDL 33, TGs 457 Labs (2/21): LDL 35, TGs 229, HDL 36 Labs (7/21): K 4.2, creatinine 2.9 Labs (8/21): LDL 53, TGs 140, AST 77, ALT 65 Labs (9/21): K 4.1, creatinine 2.66, hgb 9.3, TSH normal  PMH: 1. Chronic diastolic CHF: Echo (0/63) with EF 60-65%, moderate LVH, mildly dilated RV with mildly decreased systolic function, bioprosthetic aortic valve s/p TAVR appeared to function normally.  - RHC (2/20): mean RA 13, PA 50/21 mean 33, PCWP mean 25, CI 2.92, PVR 1.3 WU  - TEE (10/20): EF 60-65%, mild LVH, mild MR, s/p TAVR with no stenosis and mild peri-valvular leakage, no active endocarditis.  - TEE (7/21): EF 55-60%, mild LVH, PASP 66, bioprosthetic aortic valve with mean gradient 11 mmHg, old mitral valve vegetation, mild-moderate MR with mild MS.  2. Cardiac amyloidosis: TTR amyloidosis.   - PYP scan (2/20): grade 2, H/CL ratio 1.8 => suggestive of TTR amyloidosis.  - myeloma panel negative.  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  3. Bioprosthetic aortic valve s/p TAVR.  4. CKD: Stage 3.  5. Atrial fibrillation: Paroxysmal.  - TEE-guided DCCV in 7/21.  6. Hyperlipidemia: unable to tolerate Crestor or Vascepa.  7. GERD with hiatal hernia 8. HTN 9. Obesity s/p gastric bypass in 2015.  10. Type 2 diabetes 11. OSA: Unable to tolerate CPAP.  12. CAD: LHC (8/19) with 50% proximal RCA, 50% proximal LCx, 65% mid LCx, 99% distal LAD (medical management).  13. Carotid dopplers (8/19) with mild disease.  14. PAD: Peripheral arterial angiography in 7/20 with PTCA right popliteal artery and right AT.  - S/p left distal SFA-TP  trunk bypass and left TMA in 2/21.  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  16. Mitral valve disease: TEE in 7/21 with mild-moderate MR, mild MS.  17. 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    Current Outpatient Medications  Medication Sig Dispense Refill  . ACCU-CHEK AVIVA PLUS test strip USE TO CHECK BLOOD SUGAR UP TO TWICE DAILY OR AS INSTRUCTED 100 each 2  . amiodarone (PACERONE) 200 MG tablet Take 1 tablet (200 mg) twice daily for 2 weeks then take 1 tablet (200 mg) once daily 60 tablet 0  . B-D INS SYR ULTRAFINE 1CC/31G 31G X 5/16" 1 ML MISC USE TO INJECT INSULIN TWICE A DAY AS INSTRUCTED (Patient taking differently: 1 Stick by Other route 3 (three) times daily before meals. ) 100 each 2  . Calcium Carbonate (CALCIUM 600 PO) Take 1 tablet by mouth daily.    . Cholecalciferol (VITAMIN D3 PO) Take 1 tablet by mouth in the morning and at bedtime.     . Cyanocobalamin (VITAMIN B-12 PO) Take 1 tablet by mouth daily. 2 gummies by mouth daily     . docusate sodium (COLACE) 50 MG capsule Take 2 capsules (100 mg total) by mouth 2 (two) times daily. 10 capsule 0  . ELIQUIS 5 MG TABS tablet TAKE 1 TABLET BY MOUTH TWICE A DAY 180 tablet 1  . fenofibrate (TRICOR) 145 MG tablet TAKE 1 TABLET BY MOUTH EVERY DAY 90 tablet 3  . hydrALAZINE (APRESOLINE) 50 MG tablet Take 1.5 tablets (75 mg total) by mouth 3 (three) times daily. 135 tablet 11  . insulin NPH Human (NOVOLIN N) 100 UNIT/ML injection Inject 20-60 Units into the skin 2 (two) times daily before a meal. 60 units in the am 60 units at lunch and 20 units for supper - on a sliding scale    . Insulin Pen Needle 32G X 4 MM MISC Use to inject insulin with insulin pen 100 each 4  . Multiple Vitamins-Minerals (MULTIVITAMIN GUMMIES ADULT PO) Taking two gummies by mouth daily    . NOVOLIN R RELION 100 UNIT/ML injection Inject 20-30 Units into the skin 3 (three) times daily before meals. Per sliding  scale    . Pitavastatin Calcium (LIVALO) 4 MG TABS Take by mouth daily.    Marland Kitchen PRALUENT 75 MG/ML SOAJ INJECT 75 MG INTO THE SKIN EVERY 14 (FOURTEEN) DAYS. 2 pen 6  . RELION INSULIN SYRINGE 31G X 15/64" 1 ML MISC Inject 1 Syringe into the skin 3 (three) times daily.    . Tafamidis (VYNDAMAX) 61 MG CAPS Take 61 mg by mouth daily. 30 capsule 11  . torsemide (DEMADEX) 20 MG tablet Take 1 tablet (20 mg total) by mouth 2 (two) times daily. 60 tablet 0  . VASCEPA 1 g capsule TAKE 2 CAPSULES (2 G TOTAL) BY MOUTH 2 (TWO) TIMES A DAY. 120 capsule 6  . loratadine (CLARITIN) 10 MG tablet Take 10 mg by mouth daily.  (Patient not  taking: Reported on 05/19/2020)    . meclizine (ANTIVERT) 12.5 MG tablet Take 1 tablet (12.5 mg total) by mouth 3 (three) times daily as needed for dizziness. (Patient not taking: Reported on 05/19/2020) 30 tablet 5  . nutrition supplement, JUVEN, (JUVEN) PACK Take 1 packet by mouth 2 (two) times daily between meals. (Patient not taking: Reported on 05/19/2020) 60 packet 0  . ondansetron (ZOFRAN ODT) 4 MG disintegrating tablet Take 1 tablet (4 mg total) by mouth every 8 (eight) hours as needed for nausea. (Patient not taking: Reported on 05/19/2020) 10 tablet 0  . pantoprazole (PROTONIX) 40 MG tablet Take 1 tablet (40 mg total) by mouth daily. 30 tablet 11   No current facility-administered medications for this encounter.    Allergies:   Amlodipine, Atorvastatin, Crestor [rosuvastatin calcium], Ezetimibe-simvastatin, Sitagliptin, Statins, Celebrex [celecoxib], and Levemir [insulin detemir]   Social History:  The patient  reports that she has never smoked. She has never used smokeless tobacco. She reports previous alcohol use. She reports that she does not use drugs.   Family History:  The patient's family history includes Colitis in her mother; Colon polyps in her father; Diabetes in her father, maternal grandfather, mother, and another family member; Heart attack in her sister; Heart  disease in her father; Heart failure in her father and mother; Hypertension in her mother; Kidney Stones in her mother; Skin cancer in her mother; Stroke in her maternal grandfather and mother.   ROS:  Please see the history of present illness.   All other systems are personally reviewed and negative.   Exam:   BP (!) 144/62 (BP Location: Right Arm, Patient Position: Sitting, Cuff Size: Normal)   Pulse 71   Ht 5\' 7"  (1.702 m)   Wt 90.3 kg (199 lb)   SpO2 98%   BMI 31.17 kg/m  General: NAD Neck: thick. No JVD, no thyromegaly or thyroid nodule.  Lungs: Clear to auscultation bilaterally with normal respiratory effort. CV: Nondisplaced PMI.  Heart regular S1/S2, no S3/S4, no murmur.  1+ edema to knee on left, 1+ ankle edema on right.  No carotid bruit.  Unable to palpate pedal pulses.  Abdomen: Soft, nontender, no hepatosplenomegaly, no distention.  Skin: Intact without lesions or rashes.  Neurologic: Alert and oriented x 3.  Psych: Normal affect. Extremities: No clubbing or cyanosis. Left TMA.  HEENT: Normal.   Recent Labs: 02/21/2020: B Natriuretic Peptide 538.2 02/27/2020: Magnesium 2.0 04/21/2020: TSH 1.210 05/19/2020: ALT 85; BUN 47; Creatinine, Ser 3.19; Hemoglobin 8.8; Platelets 423; Potassium 3.6; Sodium 140  Personally reviewed   Wt Readings from Last 3 Encounters:  05/19/20 90.3 kg (199 lb)  04/27/20 93.4 kg (205 lb 12.8 oz)  04/21/20 94.3 kg (208 lb)    ASSESSMENT AND PLAN:  1. Chronic diastolic CHF: TEE in 4/82 showed EF 55-60%, mild LVH, stable bioprosthetic aortic valve s/p TAVR.PYP scan in 2/20 suggestive of transthyretin amyloidosis, genetic testing suggestive of wild type.  NYHA class II-III symptoms, she is at baseline. She does not look volume overloaded on exam.  - Continue torsemide 20 mg bid. BMET today.    - I think she would be a good candidate for Cardiomems.  We have discussed this and she is reluctant.  2. CKD stage 3:  BMET today.   3. CAD: Cath in 8/19  with moderate RCA and LCx disease, severe distal LAD disease, medical management.  No chest pain. - No ASA given apixaban use.  - Continue pitivastatin 4 mg daily  and Praluent.    - Continue fenofibrate and vascepa.  4. Atrial fibrillation: Paroxysmal. She is in NSR today on amiodarone.  - Continue Eliquis. CBC today.  - Continue amiodarone 200 mg daily.  TSH recently normal.  LFTs recently increased, repeat LFTs today.  She will need a regular eye exam.  5. HTN: BP still mildly elevated.  She is off Coreg with junctional bradycardia at 7/21 admission.  - She cannot take amlodipine due to peripheral edema formation.  - Hold off on ACEI/ARB with CKD.  - Continue hydralazine 75 mg tid.     6. PAD: S/p PTCA to right popliteal and right AT in 7/20.  Now with left distal SFA-TP trunk bypass and left TMA in 2/21.   - She has followup with vascular surgery at [facility].  7. 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  - She is on tafamidis, continue   8. 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   9. Enterococcal endocarditis: Involving mitral valve, 7/21 TEE showed possible old calcified vegetation.    10. Hyperlipidemia: Lipids good on 8/21 panel, TGs controlled now.  Continue current regimen.  11. Mitral valve disease: TEE in 7/21 with mild-moderate MR, mild MS.   12. GERD: Suspect her symptoms after eating are GERD.  - Protonix 40 mg daily.   Followup in 6 wks with NP/PA.   Signed, [name], MD  05/20/2020   [facility] [address] [facility] and [place] [address] [phone] (office) [phone] (fax)

## 2020-05-22 ENCOUNTER — Telehealth (HOSPITAL_COMMUNITY): Payer: Self-pay

## 2020-05-22 MED ORDER — TORSEMIDE 20 MG PO TABS: 20.0000 mg | ORAL_TABLET | Freq: Every day | ORAL | 3 refills | Status: DC

## 2020-05-22 NOTE — Telephone Encounter (Signed)
-----   Message from Larey Dresser, MD sent at 05/20/2020  4:32 PM EDT ----- Creatinine higher, hold torsemide for 1 day then decrease to 20 mg daily.

## 2020-05-22 NOTE — Telephone Encounter (Signed)
   Malena Edman, RN  05/22/2020 11:03 AM EDT Back to Top    Patient advised and verbalized understanding. Med list updated   Shonna Chock, Josephine  05/21/2020 10:10 AM EDT     Tried calling patient, no answer,unable to leave message. Will try again later.    Malena Edman, RN  05/20/2020 4:39 PM EDT     Attempted to call patient. No answer, unable to leave message. Will call back later

## 2020-05-27 ENCOUNTER — Other Ambulatory Visit (HOSPITAL_COMMUNITY): Payer: Self-pay | Admitting: *Deleted

## 2020-05-27 ENCOUNTER — Telehealth (HOSPITAL_COMMUNITY): Payer: Self-pay | Admitting: *Deleted

## 2020-05-27 MED ORDER — TORSEMIDE 20 MG PO TABS: 20.0000 mg | ORAL_TABLET | Freq: Two times a day (BID) | ORAL | 3 refills | Status: DC

## 2020-05-27 NOTE — Telephone Encounter (Signed)
Pt aware and verbalized understanding. Pt asked that I send her an order so she can get labs closer to her. Order mailed to home address.

## 2020-05-27 NOTE — Telephone Encounter (Signed)
Pt left VM stating she is more short of breath since decrease in Torsemide from 20mg  bid  to 20mg  daily. Creat on 10/5 3.19 from 9/7 2.66. Pt said she is "huffing and puffing" while at rest.  Pt wants to know if she can increase her medication.   Routed to [place] for advice.

## 2020-05-27 NOTE — Telephone Encounter (Signed)
Increase the torsemide back to 20 mg bid. BMET 1 week.

## 2020-05-28 ENCOUNTER — Ambulatory Visit: Payer: Medicare Other | Admitting: *Deleted

## 2020-05-28 DIAGNOSIS — N184 Chronic kidney disease, stage 4 (severe): Secondary | ICD-10-CM

## 2020-05-28 DIAGNOSIS — E1159 Type 2 diabetes mellitus with other circulatory complications: Secondary | ICD-10-CM

## 2020-05-28 DIAGNOSIS — Z794 Long term (current) use of insulin: Secondary | ICD-10-CM

## 2020-05-28 DIAGNOSIS — E1122 Type 2 diabetes mellitus with diabetic chronic kidney disease: Secondary | ICD-10-CM

## 2020-05-28 DIAGNOSIS — E1169 Type 2 diabetes mellitus with other specified complication: Secondary | ICD-10-CM

## 2020-05-28 DIAGNOSIS — E785 Hyperlipidemia, unspecified: Secondary | ICD-10-CM

## 2020-05-28 NOTE — Chronic Care Management (AMB) (Signed)
  Chronic Care Management   Initial Visit Note  05/28/2020 Name: [name] MRN: [id number] DOB: 09-05-1950  Referred by: Alison Norlander, DO Reason for referral : Chronic Care Management (Initial Visit)   [name] is a 69 y.o. year old female who is a primary care patient of Alison Norlander, DO. The CCM team was consulted for assistance with chronic disease management and care coordination needs related to Atrial Fibrillation, CAD, HTN, HLD, DMII and CKD Stage 3  Review of patient status, including review of consultants reports, relevant laboratory and other test results, and collaboration with appropriate care team members and the patient's provider was performed as part of comprehensive patient evaluation and provision of chronic care management services.    SDOH (Social Determinants of Health) assessments performed: Yes See Care Plan activities for detailed interventions related to SDOH    I talked with [name] by telephone today regarding management of her chronic medical conditions, resource needs, support systems, mobility, and assistive devices. She does not have any resource needs and although she has many medical chronic medical conditions, she feels that she has good support from her family and a good medical team to help provide care and education. She does not feel that enrollment in CCM is needed at this time wrote down my telephone number and will reach out in the future in services are needed.   Plan:  The patient has been provided with contact information for the care management team and has been advised to call with any health related questions or concerns.  CCM enrollment status changed to "previously enrolled" as per patient request on 05/28/2020 to discontinue enrollment. Case closed to case management services in primary care home.   Chong Sicilian, BSN, RN-BC Embedded Chronic Care Manager [facility] / [place]  Management Direct Dial: [phone]

## 2020-05-28 NOTE — Patient Instructions (Signed)
Plan:  The patient has been provided with contact information for the care management team and has been advised to call with any health related questions or concerns.  CCM enrollment status changed to "previously enrolled" as per patient request on 05/28/2020 to discontinue enrollment. Case closed to case management services in primary care home.   Chong Sicilian, BSN, RN-BC Embedded Chronic Care Manager [facility] / [place] Management Direct Dial: [phone]

## 2020-06-01 ENCOUNTER — Telehealth (HOSPITAL_COMMUNITY): Payer: Self-pay | Admitting: Cardiology

## 2020-06-01 NOTE — Telephone Encounter (Signed)
cardioMEMS update Application faxed  08/20/24 Approved 02/03/20 Attempted to schedule implant, patient declined

## 2020-06-03 ENCOUNTER — Emergency Department (HOSPITAL_COMMUNITY): Payer: Medicare Other

## 2020-06-03 ENCOUNTER — Other Ambulatory Visit: Payer: Medicare Other

## 2020-06-03 ENCOUNTER — Emergency Department (HOSPITAL_COMMUNITY)
Admission: EM | Admit: 2020-06-03 | Discharge: 2020-06-04 | Disposition: A | Discharge status: Home / Self Care | Payer: Medicare Other | Attending: Emergency Medicine | Admitting: Emergency Medicine

## 2020-06-03 ENCOUNTER — Encounter (HOSPITAL_COMMUNITY): Payer: Self-pay | Admitting: Emergency Medicine

## 2020-06-03 ENCOUNTER — Telehealth: Payer: Self-pay | Admitting: *Deleted

## 2020-06-03 DIAGNOSIS — J90 Pleural effusion, not elsewhere classified: Secondary | ICD-10-CM | POA: Diagnosis not present

## 2020-06-03 DIAGNOSIS — I2510 Atherosclerotic heart disease of native coronary artery without angina pectoris: Secondary | ICD-10-CM | POA: Diagnosis not present

## 2020-06-03 DIAGNOSIS — E1122 Type 2 diabetes mellitus with diabetic chronic kidney disease: Secondary | ICD-10-CM | POA: Diagnosis not present

## 2020-06-03 DIAGNOSIS — N184 Chronic kidney disease, stage 4 (severe): Secondary | ICD-10-CM | POA: Diagnosis not present

## 2020-06-03 DIAGNOSIS — R0602 Shortness of breath: Secondary | ICD-10-CM | POA: Diagnosis not present

## 2020-06-03 DIAGNOSIS — Z7901 Long term (current) use of anticoagulants: Secondary | ICD-10-CM | POA: Diagnosis not present

## 2020-06-03 DIAGNOSIS — I5033 Acute on chronic diastolic (congestive) heart failure: Secondary | ICD-10-CM | POA: Insufficient documentation

## 2020-06-03 DIAGNOSIS — R062 Wheezing: Secondary | ICD-10-CM | POA: Diagnosis not present

## 2020-06-03 DIAGNOSIS — E1151 Type 2 diabetes mellitus with diabetic peripheral angiopathy without gangrene: Secondary | ICD-10-CM | POA: Diagnosis not present

## 2020-06-03 DIAGNOSIS — J069 Acute upper respiratory infection, unspecified: Secondary | ICD-10-CM | POA: Insufficient documentation

## 2020-06-03 DIAGNOSIS — Z20822 Contact with and (suspected) exposure to covid-19: Secondary | ICD-10-CM | POA: Insufficient documentation

## 2020-06-03 DIAGNOSIS — Z794 Long term (current) use of insulin: Secondary | ICD-10-CM | POA: Insufficient documentation

## 2020-06-03 DIAGNOSIS — Z79899 Other long term (current) drug therapy: Secondary | ICD-10-CM | POA: Diagnosis not present

## 2020-06-03 DIAGNOSIS — Z8673 Personal history of transient ischemic attack (TIA), and cerebral infarction without residual deficits: Secondary | ICD-10-CM | POA: Diagnosis not present

## 2020-06-03 DIAGNOSIS — I517 Cardiomegaly: Secondary | ICD-10-CM | POA: Diagnosis not present

## 2020-06-03 DIAGNOSIS — I130 Hypertensive heart and chronic kidney disease with heart failure and stage 1 through stage 4 chronic kidney disease, or unspecified chronic kidney disease: Secondary | ICD-10-CM | POA: Diagnosis not present

## 2020-06-03 DIAGNOSIS — B974 Respiratory syncytial virus as the cause of diseases classified elsewhere: Secondary | ICD-10-CM

## 2020-06-03 DIAGNOSIS — B338 Other specified viral diseases: Secondary | ICD-10-CM

## 2020-06-03 LAB — RESP PANEL BY RT PCR (RSV, FLU A&B, COVID)
Influenza A by PCR: NEGATIVE
Influenza B by PCR: NEGATIVE
Respiratory Syncytial Virus by PCR: POSITIVE — AB
SARS Coronavirus 2 by RT PCR: NEGATIVE

## 2020-06-03 LAB — BASIC METABOLIC PANEL
Anion gap: 15 (ref 5–15)
BUN: 50 mg/dL — ABNORMAL HIGH (ref 8–23)
CO2: 24 mmol/L (ref 22–32)
Calcium: 8.7 mg/dL — ABNORMAL LOW (ref 8.9–10.3)
Chloride: 96 mmol/L — ABNORMAL LOW (ref 98–111)
Creatinine, Ser: 3.52 mg/dL — ABNORMAL HIGH (ref 0.44–1.00)
GFR, Estimated: 13 mL/min — ABNORMAL LOW (ref >60)
Glucose, Bld: 346 mg/dL — ABNORMAL HIGH (ref 70–99)
Potassium: 4.4 mmol/L (ref 3.5–5.1)
Sodium: 135 mmol/L (ref 135–145)

## 2020-06-03 LAB — TROPONIN I (HIGH SENSITIVITY)
Troponin I (High Sensitivity): 40 ng/L — ABNORMAL HIGH (ref <18)
Troponin I (High Sensitivity): 39 ng/L — ABNORMAL HIGH (ref <18)

## 2020-06-03 LAB — CBC
HCT: 28.7 % — ABNORMAL LOW (ref 36.0–46.0)
Hemoglobin: 8.1 g/dL — ABNORMAL LOW (ref 12.0–15.0)
MCH: 19.9 pg — ABNORMAL LOW (ref 26.0–34.0)
MCHC: 28.2 g/dL — ABNORMAL LOW (ref 30.0–36.0)
MCV: 70.5 fL — ABNORMAL LOW (ref 80.0–100.0)
Platelets: 396 10*3/uL (ref 150–400)
RBC: 4.07 MIL/uL (ref 3.87–5.11)
RDW: 18.1 % — ABNORMAL HIGH (ref 11.5–15.5)
WBC: 6.9 10*3/uL (ref 4.0–10.5)
nRBC: 0 % (ref 0.0–0.2)

## 2020-06-03 LAB — BRAIN NATRIURETIC PEPTIDE: B Natriuretic Peptide: 718.7 pg/mL — ABNORMAL HIGH (ref 0.0–100.0)

## 2020-06-03 MED ORDER — ALBUTEROL SULFATE HFA 108 (90 BASE) MCG/ACT IN AERS
8.0000 | INHALATION_SPRAY | Freq: Once | RESPIRATORY_TRACT | Status: AC
  Administered 2020-06-03: 8 via RESPIRATORY_TRACT
  Filled 2020-06-03: qty 6.7

## 2020-06-03 MED ORDER — HYDROCOD POLST-CPM POLST ER 10-8 MG/5ML PO SUER
5.0000 mL | Freq: Once | ORAL | Status: AC
  Administered 2020-06-03: 5 mL via ORAL
  Filled 2020-06-03: qty 5

## 2020-06-03 MED ORDER — HYDROCOD POLST-CPM POLST ER 10-8 MG/5ML PO SUER: 5.0000 mL | Freq: Two times a day (BID) | ORAL | 0 refills | Status: DC | PRN

## 2020-06-03 MED ORDER — DEXAMETHASONE SODIUM PHOSPHATE 10 MG/ML IJ SOLN
10.0000 mg | Freq: Once | INTRAMUSCULAR | Status: AC
  Administered 2020-06-03: 10 mg via INTRAVENOUS
  Filled 2020-06-03: qty 1

## 2020-06-03 MED ORDER — AEROCHAMBER PLUS FLO-VU LARGE MISC
1.0000 | Freq: Once | Status: AC
  Administered 2020-06-03: 1

## 2020-06-03 MED ORDER — FUROSEMIDE 10 MG/ML IJ SOLN
40.0000 mg | Freq: Once | INTRAMUSCULAR | Status: AC
  Administered 2020-06-03: 40 mg via INTRAVENOUS
  Filled 2020-06-03: qty 4

## 2020-06-03 MED ORDER — BENZONATATE 100 MG PO CAPS
200.0000 mg | ORAL_CAPSULE | Freq: Once | ORAL | Status: AC
  Administered 2020-06-03: 200 mg via ORAL
  Filled 2020-06-03: qty 2

## 2020-06-03 NOTE — ED Provider Notes (Signed)
3:12 PM BP (!) 157/51 (BP Location: Right Arm)   Pulse 72   Temp 98 F (36.7 C) (Oral)   Resp 18   Ht 5' 7.5" (1.715 m)   Wt 90.7 kg   SpO2 98%   BMI 30.94 kg/m  69 year old female here with URI symptoms after exposure from her grandchild.  Patient is taken in signout at shift change from [place].  Currently awaiting medication delivery for symptoms of wheezing and coughing.  Plan is to ambulate with oxygen saturations after albuterol treatment, delta troponin, expect discharge if improved.\    6:22 PM BP (!) 153/56   Pulse 74   Temp 98 F (36.7 C) (Oral)   Resp (!) 22   Ht 5' 7.5" (1.715 m)   Wt 90.7 kg   SpO2 96%   BMI 30.86 kg/m  Patient breathing is significantly improved.  Her oxygen saturation has been 100% on room air throughout I do not feel that she needs to be ambulated at this time.  She has minimal wheeze with cough but no inspiratory or expiratory wheezing and lung sounds are clear.  His daughter also happens to be a respiratory therapist which is beneficial because she can keep an eye on her mom's breathing.  Attempted to discharge the patient with some stronger cough medicine.  She appears otherwise greatly improved after medications here in the emergency department and appropriate for discharge at this time.   Margarita Mail, PA-C 06/03/20 1827    Drenda Freeze, MD 06/03/20 2217

## 2020-06-03 NOTE — ED Provider Notes (Signed)
[facility] EMERGENCY DEPARTMENT Provider Note   CSN: [id number] Arrival date & time: 06/03/20  1239     History Chief Complaint  Patient presents with  . Shortness of Breath    [name] is a 69 y.o. female with history of AAS s/p TAVR, chronic diastolic CHF EF 55 to 30%, PAF, on Eliquis, CKD, hypertension, hyperlipidemia, OSA, morbid obesity, diabetes presents to the ED for evaluation of shortness of breath for the last 2 days.  Short of breath with laying flat.  Usually can lay flat on her back but cannot tolerate this.  Has associated runny nose, sneezing.  Had a sore throat for a day but improved.  Feels like her tongue is raw and cannot taste anything.  Fully vaccinated for COVID-19 had 2 maternal vaccines March/April.  No booster.  2 weeks ago was around her granddaughter who had a runny nose, cough.  Has been compliant with torsemide 20 mg twice daily.  Checks her weight and is about her usual dry weight 200 pounds, unchanged.  Denies fever, chest pain.  Denies vomiting, diarrhea, abdominal pain.  Chronic constipation unchanged.  Has been compliant with her Eliquis.  No known pulmonary disease, COPD, tobacco use.   HPI     Past Medical History:  Diagnosis Date  . Atrial fibrillation ([place])    a. maintaining sinus s/p DCCV, on Eliquis  . CAD (coronary artery disease)    a. Nonobstructive by cath 2006 - 25% LAD, 25-30% after 1st diag, distal 25% PDA, minor irreg LCx. b. Normal nuc 2011.  . CKD (chronic kidney disease), stage III ([place])   . Cystocele   . Difficult intubation 09-07-2012   big neck trouble with intubation parotid surgery"takes little med to sedate"  . Esophageal stricture   . GERD (gastroesophageal reflux disease)   . Hiatal hernia    a. 07/2013: HH with small stricture holding up barium tablet (concides with patient's sx of food sticking).  . History of kidney stones   . Hyperlipidemia   . Hypertension   . IBS (irritable bowel syndrome)    . Memory difficulty 11/26/2015  . Morbid obesity ([place])    a. hx of gastric bypass (sleeve gastrectomy 2015)  . Non-STEMI (non-ST elevated myocardial infarction) ([place]) 01/16/2020  . Osteoporosis   . 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  Marland Kitchen Pericardial effusion    a. Mod by echo 2012 at time of PNA. b. Echo 07/2012: small pericardial effusion vs fat.  . Pressure ulcer 04/16/2019   right foot  . S/P TAVR (transcatheter aortic valve replacement)   . Severe aortic stenosis   . Sleep apnea     "have mask; don't use it" (02/14/2018)  . TIA (transient ischemic attack) 10/2014; 06/2016   "some memory issues since; daughter says my speech is sometimes different" (02/14/2018)  . Type II diabetes mellitus [facility])     Patient Active Problem List   Diagnosis Date Noted  . CKD stage 4 due to type 2 diabetes mellitus ([place]) 02/27/2020  . Essential hypertension 02/26/2020  . Class 3 obesity 02/26/2020  . Bradycardia 02/19/2020  . Hyperglycemia   . Hospital discharge follow-up 01/24/2020  . Non-STEMI (non-ST elevated myocardial infarction) ([place]) 01/16/2020  . NSTEMI (non-ST elevated myocardial infarction) ([place]) 01/16/2020  . Acute on chronic diastolic heart failure ([place]) 12/09/2019  . S/P transmetatarsal amputation of foot, left ([place]) 10/23/2019  . Subacute bacterial endocarditis 05/21/2019  . 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 Bacteremia due to Enterococcus   . Pressure injury of skin 04/16/2019  . Infected wound 04/16/2019  . Weakness generalized 04/15/2019  . SIRS (systemic inflammatory response syndrome) ([place]) 04/15/2019  . Anasarca 09/19/2018  . Acute encephalopathy 06/16/2018  . 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 Acute on chronic diastolic CHF (congestive heart failure) ([place]) 05/10/2018  . Morbid obesity ([place])   . Hypertension associated with diabetes ([place])   . Chronic kidney disease   . Sleep apnea   . Hyperlipidemia   . GERD (gastroesophageal reflux  disease)   . Persistent atrial fibrillation with rapid ventricular response ([place])   . S/P TAVR (transcatheter aortic valve replacement)   . Severe aortic stenosis   . Vertigo 05/06/2015  . TIA (transient ischemic attack), history of 11/07/2014  . 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 CKD stage 3 due to type 2 diabetes mellitus ([place]) 06/03/2014  . Type II diabetes mellitus with renal manifestations ([place]) 06/03/2014  . Type II diabetes mellitus with peripheral circulatory disorder ([place]) 06/03/2014  . Hyperlipidemia associated with type 2 diabetes mellitus ([place]) 12/26/2008  . Benign essential HTN 12/26/2008  . Coronary artery disease, non-occlusive 12/26/2008  . Pericardial effusion 12/26/2008    Past Surgical History:  Procedure Laterality Date  . BREATH TEK H PYLORI N/A 07/29/2013   Procedure: [place];  Surgeon: Shann Medal, MD;  Location: [facility];  Service: General;  Laterality: N/A;  . CARDIAC CATHETERIZATION  2006  . CARDIOVERSION N/A 02/16/2018   Procedure: CARDIOVERSION;  Surgeon: Pixie Casino, MD;  Location: [facility] ENDOSCOPY;  Service: Cardiovascular;  Laterality: N/A;  . CARDIOVERSION N/A 02/25/2020   Procedure: CARDIOVERSION;  Surgeon: Larey Dresser, MD;  Location: [facility];  Service: Cardiovascular;  Laterality: N/A;  . CARPAL TUNNEL RELEASE Left 2011  . CATARACT EXTRACTION W/ INTRAOCULAR LENS  IMPLANT, BILATERAL Bilateral 2003   left  . CHOLECYSTECTOMY OPEN  1982  . COLONOSCOPY N/A 07/14/2015   Procedure: COLONOSCOPY;  Surgeon: Ladene Artist, MD;  Location: [facility];  Service: Endoscopy;  Laterality: N/A;  . ESOPHAGOGASTRODUODENOSCOPY N/A 12/27/2013   Procedure: ESOPHAGOGASTRODUODENOSCOPY (EGD);  Surgeon: Shann Medal, MD;  Location: [facility];  Service: General;  Laterality: N/A;  . ESOPHAGOGASTRODUODENOSCOPY (EGD) WITH ESOPHAGEAL DILATION    . INTRAOPERATIVE TRANSTHORACIC ECHOCARDIOGRAM  05/08/2018   Procedure: INTRAOPERATIVE  TRANSTHORACIC ECHOCARDIOGRAM;  Surgeon: Burnell Blanks, MD;  Location: [facility];  Service: Open Heart Surgery;;  . IR FLUORO GUIDE CV LINE RIGHT  04/25/2019  . IR REMOVAL TUN CV CATH W/O FL  06/20/2019  . IR US GUIDE VASC ACCESS RIGHT  04/25/2019  . LAPAROSCOPIC GASTRIC SLEEVE RESECTION N/A 01/20/2014   Procedure: LAPAROSCOPIC GASTRIC SLEEVE RESECTION;  Surgeon: Shann Medal, MD;  Location: [facility];  Service: General;  Laterality: N/A;  . PAROTIDECTOMY  09/07/2012   Procedure: PAROTIDECTOMY;  Surgeon: Melida Quitter, MD;  Location: [facility];  Service: ENT;  Laterality: Left;  LEFT PAROTIDECTOMY  . RIGHT HEART CATH N/A 09/25/2018   Procedure: RIGHT HEART CATH;  Surgeon: Larey Dresser, MD;  Location: [facility];  Service: Cardiovascular;  Laterality: N/A;  . RIGHT/LEFT HEART CATH AND CORONARY ANGIOGRAPHY N/A 03/29/2018   Procedure: RIGHT/LEFT HEART CATH AND CORONARY ANGIOGRAPHY;  Surgeon: Burnell Blanks, MD;  Location: [facility] CV LAB;  Service: Cardiovascular;  Laterality: N/A;  . TEE WITHOUT CARDIOVERSION N/A 02/16/2018   Procedure: TRANSESOPHAGEAL ECHOCARDIOGRAM (TEE);  Surgeon: Pixie Casino, MD;  Location: [facility];  Service: Cardiovascular;  Laterality: N/A;  . TEE WITHOUT CARDIOVERSION N/A 06/05/2019   Procedure: TRANSESOPHAGEAL ECHOCARDIOGRAM (TEE);  Surgeon: Sanda Klein, MD;  Location: [facility];  Service: Cardiovascular;  Laterality: N/A;  . TEE WITHOUT CARDIOVERSION N/A 04/24/2019   Procedure: TRANSESOPHAGEAL ECHOCARDIOGRAM (TEE);  Surgeon: Sanda Klein, MD;  Location: [facility];  Service: Cardiovascular;  Laterality: N/A;  . TEE WITHOUT CARDIOVERSION N/A 02/25/2020   Procedure: TRANSESOPHAGEAL ECHOCARDIOGRAM (TEE);  Surgeon: Larey Dresser, MD;  Location: [facility] ENDOSCOPY;  Service: Cardiovascular;  Laterality: N/A;  . TOE AMPUTATION Left 07/2017   great toe; [facility]   . TONSILLECTOMY  1968  . TRANSCATHETER AORTIC VALVE REPLACEMENT, TRANSFEMORAL   05/08/2018   Transcatheter Aortic Valve Replacement   . TRANSCATHETER AORTIC VALVE REPLACEMENT, TRANSFEMORAL N/A 05/08/2018   Procedure: TRANSCATHETER AORTIC VALVE REPLACEMENT, TRANSFEMORAL;  Surgeon: Burnell Blanks, MD;  Location: [facility];  Service: Open Heart Surgery;  Laterality: N/A;  . TUBAL LIGATION  yrs ago  . UPPER GI ENDOSCOPY  01/20/2014   Procedure: UPPER GI ENDOSCOPY;  Surgeon: Shann Medal, MD;  [facility];  Service: General;;     OB History    Gravida  3   Para  3   Term  3   Preterm      AB      Living  3     SAB      TAB      Ectopic      Multiple      Live Births  3           Family History  Problem Relation Age of Onset  . Heart failure Mother   . Hypertension Mother   . Skin cancer Mother   . Colitis Mother   . Diabetes Mother   . Kidney Stones Mother   . Stroke Mother   . Heart disease Father   . Colon polyps Father   . Diabetes Father   . Heart failure Father   . Heart attack Sister   . Diabetes Other   . Stroke Maternal Grandfather   . Diabetes Maternal Grandfather   . Colon cancer Neg Hx     Social History   Tobacco Use  . Smoking status: Never Smoker  . Smokeless tobacco: Never Used  Vaping Use  . Vaping Use: Never used  Substance Use Topics  . Alcohol use: Not Currently    Alcohol/week: 0.0 standard drinks    Comment:  "mixed drink q few years"  . Drug use: Never    Home Medications Prior to Admission medications   Medication Sig Start Date End Date Taking? Authorizing Provider  ACCU-CHEK AVIVA PLUS test strip USE TO CHECK BLOOD SUGAR UP TO TWICE DAILY OR AS INSTRUCTED 09/04/17   Chipper Herb, MD  amiodarone (PACERONE) 200 MG tablet Take 1 tablet (200 mg) twice daily for 2 weeks then take 1 tablet (200 mg) once daily 02/27/20   Arrien, Jimmy Picket, MD  B-D INS SYR ULTRAFINE 1CC/31G 31G X 5/16" 1 ML MISC USE TO INJECT INSULIN TWICE A DAY AS INSTRUCTED Patient taking differently: 1 Stick by Other  route 3 (three) times daily before meals.  11/16/15   Chipper Herb, MD  Calcium Carbonate (CALCIUM 600 PO) Take 1 tablet by mouth daily.    [provider]  Cholecalciferol (VITAMIN D3 PO) Take 1 tablet by mouth in the morning and at bedtime.     [provider]  Cyanocobalamin (VITAMIN B-12 PO) Take 1 tablet by mouth daily. 2 gummies by mouth daily     [provider]  docusate sodium (COLACE) 50 MG capsule Take 2 capsules (100 mg total) by mouth 2 (two) times daily. 03/23/20   Larey Dresser, MD  ELIQUIS 5 MG TABS tablet TAKE 1 TABLET BY MOUTH TWICE A DAY 02/03/20   Minus Breeding, MD  fenofibrate (TRICOR) 145 MG tablet TAKE 1 TABLET BY MOUTH EVERY DAY 12/16/19   Larey Dresser, MD  hydrALAZINE (APRESOLINE) 50 MG tablet Take 1.5 tablets (75 mg total) by mouth 3 (three) times daily. 04/27/20   Larey Dresser, MD  insulin NPH Human (NOVOLIN N) 100 UNIT/ML injection Inject 20-60 Units into the skin 2 (two) times daily before a meal. 60 units in the am 60 units at lunch and 20 units for supper - on a sliding scale    [provider]  Insulin Pen Needle 32G X 4 MM MISC Use to inject insulin with insulin pen 05/17/16   Chipper Herb, MD  loratadine (CLARITIN) 10 MG tablet Take 10 mg by mouth daily.  Patient not taking: Reported on 05/19/2020    [provider]  meclizine (ANTIVERT) 12.5 MG tablet Take 1 tablet (12.5 mg total) by mouth 3 (three) times daily as needed for dizziness. Patient not taking: Reported on 05/19/2020 06/14/19   Larey Dresser, MD  Multiple Vitamins-Minerals (MULTIVITAMIN GUMMIES ADULT PO) Taking two gummies by mouth daily    [provider]  NOVOLIN R RELION 100 UNIT/ML injection Inject 20-30 Units into the skin 3 (three) times daily before meals. Per sliding scale 02/07/20   [provider]  nutrition supplement, JUVEN, (JUVEN) PACK Take 1 packet by mouth 2 (two) times daily between meals. Patient not taking:  Reported on 05/19/2020 04/26/19   Swayze, Ava, DO  ondansetron (ZOFRAN ODT) 4 MG disintegrating tablet Take 1 tablet (4 mg total) by mouth every 8 (eight) hours as needed for nausea. Patient not taking: Reported on 05/19/2020 01/03/17   Larene Pickett, PA-C  pantoprazole (PROTONIX) 40 MG tablet Take 1 tablet (40 mg total) by mouth daily. 05/19/20   Larey Dresser, MD  Pitavastatin Calcium (LIVALO) 4 MG TABS Take by mouth daily.    [provider]  PRALUENT 75 MG/ML SOAJ INJECT 75 MG INTO THE SKIN EVERY 14 (FOURTEEN) DAYS. 11/18/19   Larey Dresser, MD  RELION INSULIN SYRINGE 31G X 15/64" 1 ML MISC Inject 1 Syringe into the skin 3 (three) times daily. 01/04/20   [provider]  Tafamidis (VYNDAMAX) 61 MG CAPS Take 61 mg by mouth daily. 07/29/19   Larey Dresser, MD  torsemide (DEMADEX) 20 MG tablet Take 1 tablet (20 mg total) by mouth 2 (two) times daily. 05/27/20   Larey Dresser, MD  VASCEPA 1 g capsule TAKE 2 CAPSULES (2 G TOTAL) BY MOUTH 2 (TWO) TIMES A DAY. 01/16/20   Larey Dresser, MD  Olmesartan-Amlodipine-HCTZ [facility]) 40-5-12.5 MG TABS Take 1 tablet by mouth daily.    04/12/18  [provider]    Allergies    Amlodipine, Atorvastatin, Crestor [rosuvastatin calcium], Ezetimibe-simvastatin, Sitagliptin, Statins, Celebrex [celecoxib], and Levemir [insulin detemir]  Review of Systems   Review of Systems  HENT: Positive for congestion, rhinorrhea and sore throat.   Respiratory: Positive for cough and shortness of breath.   Hematological: Bruises/bleeds easily.  All other systems reviewed and are negative.   Physical Exam Updated  Vital Signs BP (!) 157/51 (BP Location: Right Arm)   Pulse 72   Temp 98 F (36.7 C) (Oral)   Resp 18   Ht 5' 7.5" (1.715 m)   Wt 90.7 kg   SpO2 98%   BMI 30.86 kg/m   Physical Exam Vitals and nursing note reviewed.  Constitutional:      General: She is not in acute distress.    Appearance: She is well-developed.      Comments: NAD.  HENT:     Head: Normocephalic and atraumatic.     Right Ear: External ear normal.     Left Ear: External ear normal.     Nose: Nose normal.  Eyes:     General: No scleral icterus.    Conjunctiva/sclera: Conjunctivae normal.  Cardiovascular:     Rate and Rhythm: Normal rate and regular rhythm.     Heart sounds: Normal heart sounds. No murmur heard.   Pulmonary:     Effort: Pulmonary effort is normal.     Breath sounds: Normal breath sounds.  Musculoskeletal:        General: No deformity. Normal range of motion.     Cervical back: Normal range of motion and neck supple.  Skin:    General: Skin is warm and dry.     Capillary Refill: Capillary refill takes less than 2 seconds.  Neurological:     Mental Status: She is alert and oriented to person, place, and time.  Psychiatric:        Behavior: Behavior normal.        Thought Content: Thought content normal.        Judgment: Judgment normal.     ED Results / Procedures / Treatments   Labs (all labs ordered are listed, but only abnormal results are displayed) Labs Reviewed  BASIC METABOLIC PANEL - Abnormal; Notable for the following components:      Result Value   Chloride 96 (*)    Glucose, Bld 346 (*)    BUN 50 (*)    Creatinine, Ser 3.52 (*)    Calcium 8.7 (*)    GFR, Estimated 13 (*)    All other components within normal limits  CBC - Abnormal; Notable for the following components:   Hemoglobin 8.1 (*)    HCT 28.7 (*)    MCV 70.5 (*)    MCH 19.9 (*)    MCHC 28.2 (*)    RDW 18.1 (*)    All other components within normal limits  BRAIN NATRIURETIC PEPTIDE - Abnormal; Notable for the following components:   B Natriuretic Peptide 718.7 (*)    All other components within normal limits  TROPONIN I (HIGH SENSITIVITY) - Abnormal; Notable for the following components:   Troponin I (High Sensitivity) 40 (*)    All other components within normal limits  RESP PANEL BY RT PCR (RSV, FLU A&B, COVID)  TROPONIN  I (HIGH SENSITIVITY)    EKG EKG Interpretation  Date/Time:  Wednesday June 03 2020 12:42:02 EDT Ventricular Rate:  74 PR Interval:    QRS Duration: 96 QT Interval:  400 QTC Calculation: 444 R Axis:   59 Text Interpretation: Accelerated Junctional rhythm Septal infarct , age undetermined ST & T wave abnormality, consider inferolateral ischemia Abnormal ECG No significant change since last tracing Confirmed by Isla Pence [phone]) on 06/03/2020 1:25:55 PM   Radiology DG Chest 2 View  Result Date: 06/03/2020 CLINICAL DATA:  Shortness of breath, wheezing EXAM: CHEST - 2 VIEW COMPARISON:  02/21/2020 FINDINGS: Stable cardiomegaly status post TAVR. Atherosclerotic calcification of the aortic knob. No focal airspace consolidation, pleural effusion, or pneumothorax. No acute osseous findings. IMPRESSION: Stable cardiomegaly status post TAVR. No acute cardiopulmonary findings. Electronically Signed   By: Davina Poke D.O.   On: 06/03/2020 13:40    Procedures Procedures (including critical care time)  Medications Ordered in ED Medications  albuterol (VENTOLIN HFA) 108 (90 Base) MCG/ACT inhaler 8 puff (has no administration in time range)  AeroChamber Plus Flo-Vu Large MISC 1 each (has no administration in time range)  benzonatate (TESSALON) capsule 200 mg (has no administration in time range)  dexamethasone (DECADRON) injection 10 mg (has no administration in time range)  chlorpheniramine-HYDROcodone (TUSSIONEX) 10-8 MG/5ML suspension 5 mL (has no administration in time range)  furosemide (LASIX) injection 40 mg (has no administration in time range)    ED Course  I have reviewed the triage vital signs and the nursing notes.  Pertinent labs & imaging results that were available during my care of the patient were reviewed by me and considered in my medical decision making (see chart for details).  Clinical Course as of Jun 03 1525  Wed Jun 03, 2020  1351 IMPRESSION: Stable  cardiomegaly status post TAVR. No acute cardiopulmonary findings.  DG Chest 2 View [CG]  1351 Stable   Hemoglobin(!): 8.1 [CG]  8921 Granddaughter had a little runy nose cough 2 weeks ago    [CG]  1352 Eliquis compliant    [CG]    Clinical Course User Index [CG] Arlean Hopping   MDM Rules/Calculators/A&P                          EMR, triage nurse notes reviewed to assist with history and MDM  69 year old female presents for sneezing, rhinorrhea, cough, shortness of breath, malaise for 2 days.  Exposed to her granddaughter had similar symptoms 2 weeks ago.  Has been compliant with Lasix.  Denies recent leg swelling, increased in weight.  ER work-up initiated in triage by triage RN including CBC, BMP, troponin, chest x-ray, EKG.  After my encounter I had added BNP and respiratory panel.  DDx includes URI given symptoms, recent exposure.  Fully vaccinated for COVID.  Considered CHF decompensation however clinically this is not consistent with her presentation.  She has no chest pain, doubt ACS.  Doubt PE, she is on Eliquis and compliant.  No hemoptysis.  No hypoxia.  1515: ER work-up personally visualized and interpreted.  Chest x-ray without pulmonary edema, infiltrates.  EKG without ischemic changes.  No right ventricular strain.  Stable hemoglobin.  Troponin 40. BNP 718.  Baseline BNP ~ 300. No pulmonary edema on CXR. No hypoxia, tachypnea, increased work of breathing.  Will give lasix here. Clinical presentation not consistent with decompensated CHF, ACS. Will plan to delta trop.   Will hand patient off to oncoming EDPA.   Plan to follow delta trop, respiratory panel. Patient needs meds for symptoms, ambulation to make sure not hypoxic. Strong suspicion for viral URI.  Shared with EDP [place].  Final Clinical Impression(s) / ED Diagnoses Final diagnoses:  Viral upper respiratory tract infection with cough    Rx / DC Orders ED Discharge Orders    None         Kinnie Feil, PA-C 06/03/20 [place], Julie, MD 06/04/20 [phone]

## 2020-06-03 NOTE — Telephone Encounter (Signed)
Attempted to contact patient - NA °

## 2020-06-03 NOTE — Progress Notes (Unsigned)
Patient came in today to have lab work done.  She stated to the girls at the check in window that she was having shortness of breath, cough, and was in "respiratory distress."  Triage was alerted and after gowning up I brought patient into triage for evaluation.  Patients vitals were stable:  Blood pressure 132/63, pulse 69, temperature 97.1 temporal, oxygen 99%.  We do not have any appointments available today, in person or by phone, so it was recommended to patient she go to urgent care to be seen.  Explained to patient that appointments are not available and that her vitals are all stable.  Patient and husband report that they do not go to urgent cares they go to the emergency room and will only go to [facility].  I advised patient that urgent care would be a good option and explained that the wait could be quite long in the emergency room.  Patient states that she has a family member that works at [employer] and they told her if she was in "respiratory distress" she would be seen quickly.  Patient and husband left at this point and said they were going on to the hospital.

## 2020-06-03 NOTE — Telephone Encounter (Signed)
06/03/2020  Received voicemail from patient that she's had a terrible cold for two days that is making it hard to breath. She spoke clearly and did not did not sound distressed or SOB on the voicemail.  Patient is not enrolled in CCM. Forwarding to [facility] clinical staff to reach out to patient regarding acute issue.   Chong Sicilian, BSN, RN-BC Embedded Chronic Care Manager [facility] [place] Management Direct Dial: [phone]

## 2020-06-03 NOTE — Discharge Instructions (Signed)
Use the albuterol inhaler 2 puffs every 4-6 hours as needed for cough or wheezing as well as shortness of breath and chest tightness. Discharging you with a strong pain medication.  This can make you feel sleepy.  Please follow-up with your primary care physician.  Return for the reasons listed below Your symptoms get worse or have not changed after 2 weeks. You have: A fever. Hot flashes, sweating, or chills that keep happening. A cough that brings up much more mucus than usual. A cough that brings up blood. Respiratory distress that gets worse. You feel: Very tired (lethargic). Confused.

## 2020-06-03 NOTE — ED Triage Notes (Signed)
Pt arrives with c/o sob and orthopnea x2 days, endorses worsening edema to LEs, denies chest pain, no missed doses of lasix, hx of chf.a/ox4.

## 2020-06-05 ENCOUNTER — Telehealth: Payer: Self-pay

## 2020-06-05 NOTE — Telephone Encounter (Signed)
Please call pt to schedule ER Follow Up.

## 2020-06-05 NOTE — Telephone Encounter (Signed)
Dx with RSV.  C/O cough & congestion.  Can we bring patient in office to see you or does she need a tele visit ? She needs to schedule a ER follow up

## 2020-06-05 NOTE — Telephone Encounter (Signed)
Ok to see me if COVID was negative.

## 2020-06-08 ENCOUNTER — Other Ambulatory Visit (INDEPENDENT_AMBULATORY_CARE_PROVIDER_SITE_OTHER): Payer: Medicare Other

## 2020-06-08 ENCOUNTER — Encounter: Payer: Self-pay | Admitting: Nurse Practitioner

## 2020-06-08 ENCOUNTER — Ambulatory Visit (INDEPENDENT_AMBULATORY_CARE_PROVIDER_SITE_OTHER): Payer: Medicare Other | Admitting: Nurse Practitioner

## 2020-06-08 VITALS — BP 150/50 | HR 88 | Ht 66.0 in | Wt 202.5 lb

## 2020-06-08 DIAGNOSIS — D509 Iron deficiency anemia, unspecified: Secondary | ICD-10-CM | POA: Diagnosis not present

## 2020-06-08 DIAGNOSIS — R195 Other fecal abnormalities: Secondary | ICD-10-CM

## 2020-06-08 DIAGNOSIS — Z7901 Long term (current) use of anticoagulants: Secondary | ICD-10-CM | POA: Diagnosis not present

## 2020-06-08 LAB — CBC
HCT: 27.9 % — ABNORMAL LOW (ref 36.0–46.0)
Hemoglobin: 8.7 g/dL — ABNORMAL LOW (ref 12.0–15.0)
MCHC: 31.1 g/dL (ref 30.0–36.0)
MCV: 64.7 fl — ABNORMAL LOW (ref 78.0–100.0)
Platelets: 437 10*3/uL — ABNORMAL HIGH (ref 150.0–400.0)
RBC: 4.31 Mil/uL (ref 3.87–5.11)
RDW: 18.5 % — ABNORMAL HIGH (ref 11.5–15.5)
WBC: 7.9 10*3/uL (ref 4.0–10.5)

## 2020-06-08 LAB — FERRITIN: Ferritin: 24.9 ng/mL (ref 10.0–291.0)

## 2020-06-08 NOTE — Telephone Encounter (Signed)
It was and was negative 0 Result Notes  Ref Range & Units 5 d ago  SARS Coronavirus 2 by RT PCR NEGATIVE NEGATIVE

## 2020-06-08 NOTE — Progress Notes (Signed)
ASSESSMENT AND PLAN    # 69 year old female with multiple medical medical problems presenting with acute on chronic microcytic anemia / positive hemoccult on Eliquis  Hgb has drifted to 8.1, down from baseine of 11-12  --check iron studies, probably iron deficient. Repeat CBC, make sure hgb stable.  --She needs endoscopic evaluation with EGD, probably repeat colonoscopy as well. However, patient is at increased risk for procedures given multiple co-morbidities. Furthermore, she was in ED a couple of days ago with cough and shortness of breath. Respiratory panel positive for RSV. From a respiratory standpoint I don't think she is able to undergo endoscopic procedures right now. Will start oral iron,  Monitor her hgb and see her back for follow in near future. If she has is medically stable at that time will arrange for endoscopic procedures. Ideally she Eliquis can be held for procedures.   # Chronic constipation, Stool softners help.   # GERD with pyrosis / solid food dysphagia.  Cardiology started Pantopraole a week or so ago and symptoms are improving.  --Continue Pantoprazole 30 prior to to eating   #  AS s/p TAVR  # Afib, on Amiodarone and Eliquis.  She underwent cardioversion in July.   # Hx of gastric sleeve surgery.      HISTORY OF PRESENT ILLNESS     Primary Gastroenterologist : Lucio Edward, MD  Chief Complaint : anemia  [name] is a 69 y.o. female with PMH / [place] significant for,  AS s/p TAVR, chronic diastolic heart failure, CAD, TIA, CKD 3, PAF, HTN, OSA, obesity, s/p gastric sleeve, DM, esophageal stricture  Patient referred by PCP for heme positive stool. She has chronic anemia but hgb found to be 9.1 early September, down from baseline around 11. No NSAID use. No abdominal pain or bowel changes. She hasn't had any overt GI bleeding. She takes Eliquis.    Patient has been coughing and SOB for several days. Cough productive of phlegm . Seen in ED on 10/20.  ED notes reviewed. Oxygen sats were okay on room air. Respiratory panel positive for RSV.  COVIID negative. . Influenza negative. Prescribed cough syrup with hydrocodone and an inhaler.  Troponin elevated.  BNP elevated at 718.  CXR negative. EKG without ischemic changes.   Paitent doesn't feel inhaler and cough syrup are helping.   Patient in a wheelchair but says she can walk with a walker.    Previous Endoscopic Evaluations / Pertinent Studies:   Nov 2016 screening colonoscopy  -9 mm pedunculated sigmoid polyp  Path = rectal prolapse polyp  Past Medical History:  Diagnosis Date  . Atrial fibrillation ([place])    a. maintaining sinus s/p DCCV, on Eliquis  . CAD (coronary artery disease)    a. Nonobstructive by cath 2006 - 25% LAD, 25-30% after 1st diag, distal 25% PDA, minor irreg LCx. b. Normal nuc 2011.  Marland Kitchen CHF (congestive heart failure) ([place])   . CKD (chronic kidney disease), stage III ([place])   . Cystocele   . Difficult intubation 09-07-2012   big neck trouble with intubation parotid surgery"takes little med to sedate"  . Esophageal stricture   . GERD (gastroesophageal reflux disease)   . Hiatal hernia    a. 07/2013: HH with small stricture holding up barium tablet (concides with patient's sx of food sticking).  . History of kidney stones   . Hyperlipidemia   . Hypertension   . IBS (irritable bowel syndrome)   . Memory  difficulty 11/26/2015  . Morbid obesity ([place])    a. hx of gastric bypass (sleeve gastrectomy 2015)  . Non-STEMI (non-ST elevated myocardial infarction) ([place]) 01/16/2020  . Osteoporosis   . 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  Marland Kitchen Pericardial effusion    a. Mod by echo 2012 at time of PNA. b. Echo 07/2012: small pericardial effusion vs fat.  . Pressure ulcer 04/16/2019   right foot  . S/P TAVR (transcatheter aortic valve replacement)   . Severe aortic stenosis   . Sleep apnea     "have mask; don't use it" (02/14/2018)  . TIA (transient  ischemic attack) 10/2014; 06/2016   "some memory issues since; daughter says my speech is sometimes different" (02/14/2018)  . Type II diabetes mellitus ([place])      Past Surgical History:  Procedure Laterality Date  . BREATH TEK H PYLORI N/A 07/29/2013   Procedure: [place];  Surgeon: Shann Medal, MD;  Location: [facility];  Service: General;  Laterality: N/A;  . CARDIAC CATHETERIZATION  2006  . CARDIOVERSION N/A 02/16/2018   Procedure: CARDIOVERSION;  Surgeon: Pixie Casino, MD;  Location: [facility] ENDOSCOPY;  Service: Cardiovascular;  Laterality: N/A;  . CARDIOVERSION N/A 02/25/2020   Procedure: CARDIOVERSION;  Surgeon: Larey Dresser, MD;  Location: [facility];  Service: Cardiovascular;  Laterality: N/A;  . CARPAL TUNNEL RELEASE Left 2011  . CATARACT EXTRACTION W/ INTRAOCULAR LENS  IMPLANT, BILATERAL Bilateral 2003   left  . CHOLECYSTECTOMY OPEN  1982  . COLONOSCOPY N/A 07/14/2015   Procedure: COLONOSCOPY;  Surgeon: Ladene Artist, MD;  Location: [facility] ENDOSCOPY;  Service: Endoscopy;  Laterality: N/A;  . ESOPHAGOGASTRODUODENOSCOPY N/A 12/27/2013   Procedure: ESOPHAGOGASTRODUODENOSCOPY (EGD);  Surgeon: Shann Medal, MD;  Location: [facility];  Service: General;  Laterality: N/A;  . ESOPHAGOGASTRODUODENOSCOPY (EGD) WITH ESOPHAGEAL DILATION    . INTRAOPERATIVE TRANSTHORACIC ECHOCARDIOGRAM  05/08/2018   Procedure: INTRAOPERATIVE TRANSTHORACIC ECHOCARDIOGRAM;  Surgeon: Burnell Blanks, MD;  Location: [facility];  Service: Open Heart Surgery;;  . IR FLUORO GUIDE CV LINE RIGHT  04/25/2019  . IR REMOVAL TUN CV CATH W/O FL  06/20/2019  . IR US GUIDE VASC ACCESS RIGHT  04/25/2019  . LAPAROSCOPIC GASTRIC SLEEVE RESECTION N/A 01/20/2014   Procedure: LAPAROSCOPIC GASTRIC SLEEVE RESECTION;  Surgeon: Shann Medal, MD;  Location: [facility];  Service: General;  Laterality: N/A;  . PAROTIDECTOMY  09/07/2012   Procedure: PAROTIDECTOMY;  Surgeon: Melida Quitter, MD;  Location: [facility];  Service:  ENT;  Laterality: Left;  LEFT PAROTIDECTOMY  . RIGHT HEART CATH N/A 09/25/2018   Procedure: RIGHT HEART CATH;  Surgeon: Larey Dresser, MD;  Location: [facility] CV LAB;  Service: Cardiovascular;  Laterality: N/A;  . RIGHT/LEFT HEART CATH AND CORONARY ANGIOGRAPHY N/A 03/29/2018   Procedure: RIGHT/LEFT HEART CATH AND CORONARY ANGIOGRAPHY;  Surgeon: Burnell Blanks, MD;  Location: [facility] CV LAB;  Service: Cardiovascular;  Laterality: N/A;  . TEE WITHOUT CARDIOVERSION N/A 02/16/2018   Procedure: TRANSESOPHAGEAL ECHOCARDIOGRAM (TEE);  Surgeon: Pixie Casino, MD;  Location: [facility];  Service: Cardiovascular;  Laterality: N/A;  . TEE WITHOUT CARDIOVERSION N/A 06/05/2019   Procedure: TRANSESOPHAGEAL ECHOCARDIOGRAM (TEE);  Surgeon: Sanda Klein, MD;  Location: [facility];  Service: Cardiovascular;  Laterality: N/A;  . TEE WITHOUT CARDIOVERSION N/A 04/24/2019   Procedure: TRANSESOPHAGEAL ECHOCARDIOGRAM (TEE);  Surgeon: Sanda Klein, MD;  Location: [facility] ENDOSCOPY;  Service: Cardiovascular;  Laterality: N/A;  . TEE WITHOUT CARDIOVERSION N/A  02/25/2020   Procedure: TRANSESOPHAGEAL ECHOCARDIOGRAM (TEE);  Surgeon: Larey Dresser, MD;  Location: [facility] ENDOSCOPY;  Service: Cardiovascular;  Laterality: N/A;  . TOE AMPUTATION Left 07/2017   great toe; [facility]   . TONSILLECTOMY  1968  . TRANSCATHETER AORTIC VALVE REPLACEMENT, TRANSFEMORAL  05/08/2018   Transcatheter Aortic Valve Replacement   . TRANSCATHETER AORTIC VALVE REPLACEMENT, TRANSFEMORAL N/A 05/08/2018   Procedure: TRANSCATHETER AORTIC VALVE REPLACEMENT, TRANSFEMORAL;  Surgeon: Burnell Blanks, MD;  Location: [facility];  Service: Open Heart Surgery;  Laterality: N/A;  . TUBAL LIGATION  yrs ago  . UPPER GI ENDOSCOPY  01/20/2014   Procedure: UPPER GI ENDOSCOPY;  Surgeon: Shann Medal, MD;  Location: [facility];  Service: General;;   Family History  Problem Relation Age of Onset  . Heart failure Mother   . Hypertension Mother   .  Skin cancer Mother   . Colitis Mother   . Diabetes Mother   . Kidney Stones Mother   . Stroke Mother   . Heart disease Father   . Colon polyps Father   . Diabetes Father   . Heart failure Father   . Heart attack Sister   . Stroke Maternal Grandfather   . Diabetes Maternal Grandfather   . Diabetes Daughter        prediabetes  . Colon cancer Neg Hx    Social History   Tobacco Use  . Smoking status: Never Smoker  . Smokeless tobacco: Never Used  Vaping Use  . Vaping Use: Never used  Substance Use Topics  . Alcohol use: Not Currently    Alcohol/week: 0.0 standard drinks    Comment:  "mixed drink q few years"  . Drug use: Never   Current Outpatient Medications  Medication Sig Dispense Refill  . ACCU-CHEK AVIVA PLUS test strip USE TO CHECK BLOOD SUGAR UP TO TWICE DAILY OR AS INSTRUCTED 100 each 2  . amiodarone (PACERONE) 200 MG tablet Take 1 tablet (200 mg) twice daily for 2 weeks then take 1 tablet (200 mg) once daily 60 tablet 0  . B-D INS SYR ULTRAFINE 1CC/31G 31G X 5/16" 1 ML MISC USE TO INJECT INSULIN TWICE A DAY AS INSTRUCTED (Patient taking differently: 1 Stick by Other route 3 (three) times daily before meals. ) 100 each 2  . Calcium Carbonate (CALCIUM 600 PO) Take 1 tablet by mouth daily.    . chlorpheniramine-HYDROcodone (TUSSIONEX PENNKINETIC ER) 10-8 MG/5ML SUER Take 5 mLs by mouth every 12 (twelve) hours as needed for cough. 140 mL 0  . Cholecalciferol (VITAMIN D3 PO) Take 1 tablet by mouth in the morning and at bedtime.     . Cyanocobalamin (VITAMIN B-12 PO) Take 1 tablet by mouth daily. 2 gummies by mouth daily     . docusate sodium (COLACE) 50 MG capsule Take 2 capsules (100 mg total) by mouth 2 (two) times daily. 10 capsule 0  . ELIQUIS 5 MG TABS tablet TAKE 1 TABLET BY MOUTH TWICE A DAY 180 tablet 1  . fenofibrate (TRICOR) 145 MG tablet TAKE 1 TABLET BY MOUTH EVERY DAY 90 tablet 3  . hydrALAZINE (APRESOLINE) 50 MG tablet Take 1.5 tablets (75 mg total) by mouth 3  (three) times daily. 135 tablet 11  . insulin NPH Human (NOVOLIN N) 100 UNIT/ML injection Inject 20-60 Units into the skin 2 (two) times daily before a meal. 60 units in the am 60 units at lunch and 20 units for supper - on a sliding scale    .  Insulin Pen Needle 32G X 4 MM MISC Use to inject insulin with insulin pen 100 each 4  . loratadine (CLARITIN) 10 MG tablet Take 10 mg by mouth daily.     . meclizine (ANTIVERT) 12.5 MG tablet Take 1 tablet (12.5 mg total) by mouth 3 (three) times daily as needed for dizziness. 30 tablet 5  . Multiple Vitamins-Minerals (MULTIVITAMIN GUMMIES ADULT PO) Taking two gummies by mouth daily    . NOVOLIN R RELION 100 UNIT/ML injection Inject 20-30 Units into the skin 3 (three) times daily before meals. Per sliding scale    . nutrition supplement, JUVEN, (JUVEN) PACK Take 1 packet by mouth 2 (two) times daily between meals. 60 packet 0  . ondansetron (ZOFRAN ODT) 4 MG disintegrating tablet Take 1 tablet (4 mg total) by mouth every 8 (eight) hours as needed for nausea. 10 tablet 0  . pantoprazole (PROTONIX) 40 MG tablet Take 1 tablet (40 mg total) by mouth daily. 30 tablet 11  . Pitavastatin Calcium (LIVALO) 4 MG TABS Take by mouth daily.    Marland Kitchen PRALUENT 75 MG/ML SOAJ INJECT 75 MG INTO THE SKIN EVERY 14 (FOURTEEN) DAYS. 2 pen 6  . RELION INSULIN SYRINGE 31G X 15/64" 1 ML MISC Inject 1 Syringe into the skin 3 (three) times daily.    . Tafamidis (VYNDAMAX) 61 MG CAPS Take 61 mg by mouth daily. 30 capsule 11  . torsemide (DEMADEX) 20 MG tablet Take 1 tablet (20 mg total) by mouth 2 (two) times daily. 60 tablet 3  . VASCEPA 1 g capsule TAKE 2 CAPSULES (2 G TOTAL) BY MOUTH 2 (TWO) TIMES A DAY. 120 capsule 6   No current facility-administered medications for this visit.   Allergies  Allergen Reactions  . Amlodipine Swelling    Ankle swelling  . Atorvastatin Other (See Comments)    Myalgias   . Crestor [Rosuvastatin Calcium] Other (See Comments)    Stiffness and back  pain  . Ezetimibe-Simvastatin Other (See Comments)    Leg cramps  . Sitagliptin Other (See Comments)  . Statins Other (See Comments)    Myalgias  . Celebrex [Celecoxib] Rash  . Levemir [Insulin Detemir] Rash     Review of Systems: Positive for allergy / sinus trouble, cough, hearing problems, iticing, swelling of feet / legs, excessive urination.  All other systems reviewed and negative except where noted in HPI.   PHYSICAL EXAM :    Wt Readings from Last 3 Encounters:  06/08/20 202 lb 8 oz (91.9 kg)  06/03/20 200 lb (90.7 kg)  05/19/20 199 lb (90.3 kg)    BP (!) 150/50 (BP Location: Left Arm, Patient Position: Sitting, Cuff Size: Normal)   Pulse 88   Ht 5\' 6"  (1.676 m) Comment: pt in wheelchair/s.a.d  Wt 202 lb 8 oz (91.9 kg)   BMI 32.68 kg/m  Constitutional:  Pleasant female in wheelchair in no acute distress. Psychiatric: Normal mood and affect. Behavior is normal. EENT: Pupils normal.  Conjunctivae pale. No scleral icterus. Neck supple.  Cardiovascular: Normal rate, 3+ LLE, 2+ RLE. Pulmonary/chest: Effort normal at present, breath sounds normal.  Abdominal: Examined in wheelchair. Abdomen soft, obese, nontender. Bowel sounds active t Neurological: Alert and oriented to person place and time. Skin: Skin is warm and dry. No rashes noted.  Tye Savoy, NP  06/08/2020, 11:44 AM  Cc:  Referring Provider [name], MD

## 2020-06-08 NOTE — Patient Instructions (Addendum)
If you are age 69 or older, your body mass index should be between 23-30. Your Body mass index is 32.68 kg/m. If this is out of the aforementioned range listed, please consider follow up with your Primary Care Provider.  If you are age 74 or younger, your body mass index should be between 19-25. Your Body mass index is 32.68 kg/m. If this is out of the aformentioned range listed, please consider follow up with your Primary Care Provider.   Your provider has requested that you go to the basement level for lab work before leaving today. Press "B" on the elevator. The lab is located at the first door on the left as you exit the elevator. Return to the Lab in 2 weeks to have an addition CBC.  Due to recent changes in healthcare laws, you may see the results of your imaging and laboratory studies on MyChart before your provider has had a chance to review them.  We understand that in some cases there may be results that are confusing or concerning to you. Not all laboratory results come back in the same time frame and the provider may be waiting for multiple results in order to interpret others.  Please give [place] 48 hours in order for your provider to thoroughly review all the results before contacting the office for clarification of your results.   Take Ferrous Sulfate (Iron) 325 mg twice daily.  You have been scheduled to follow up on July 02, 2020 at 11:00 am

## 2020-06-08 NOTE — Telephone Encounter (Signed)
Covid test was not done!??

## 2020-06-08 NOTE — Telephone Encounter (Signed)
appt made 06/10/20, pt aware

## 2020-06-09 LAB — IRON, TOTAL/TOTAL IRON BINDING CAP
%SAT: 4 % (calc) — ABNORMAL LOW (ref 16–45)
Iron: 18 ug/dL — ABNORMAL LOW (ref 45–160)
TIBC: 483 mcg/dL (calc) — ABNORMAL HIGH (ref 250–450)

## 2020-06-09 NOTE — Progress Notes (Signed)
Reviewed and agree with management plan. Allow recovery from RSV and significant improvement in pulmonary function prior to endoscopic evaluation.   Pricilla Riffle. [place] Plan, MD [facility] Gastroenterology

## 2020-06-10 ENCOUNTER — Other Ambulatory Visit: Payer: Self-pay

## 2020-06-10 ENCOUNTER — Ambulatory Visit (INDEPENDENT_AMBULATORY_CARE_PROVIDER_SITE_OTHER): Payer: Medicare Other | Admitting: Family Medicine

## 2020-06-10 ENCOUNTER — Ambulatory Visit (INDEPENDENT_AMBULATORY_CARE_PROVIDER_SITE_OTHER): Payer: Medicare Other

## 2020-06-10 ENCOUNTER — Telehealth: Payer: Self-pay | Admitting: Gastroenterology

## 2020-06-10 ENCOUNTER — Encounter: Payer: Self-pay | Admitting: Family Medicine

## 2020-06-10 VITALS — BP 150/56 | HR 81 | Temp 97.5°F | Ht 66.0 in | Wt 207.6 lb

## 2020-06-10 DIAGNOSIS — R0602 Shortness of breath: Secondary | ICD-10-CM | POA: Diagnosis not present

## 2020-06-10 DIAGNOSIS — L304 Erythema intertrigo: Secondary | ICD-10-CM | POA: Diagnosis not present

## 2020-06-10 DIAGNOSIS — K921 Melena: Secondary | ICD-10-CM

## 2020-06-10 DIAGNOSIS — B974 Respiratory syncytial virus as the cause of diseases classified elsewhere: Secondary | ICD-10-CM | POA: Diagnosis not present

## 2020-06-10 DIAGNOSIS — B338 Other specified viral diseases: Secondary | ICD-10-CM

## 2020-06-10 DIAGNOSIS — N184 Chronic kidney disease, stage 4 (severe): Secondary | ICD-10-CM | POA: Diagnosis not present

## 2020-06-10 DIAGNOSIS — I5032 Chronic diastolic (congestive) heart failure: Secondary | ICD-10-CM | POA: Diagnosis not present

## 2020-06-10 MED ORDER — NYSTATIN 100000 UNIT/GM EX CREA: 1.0000 "application " | TOPICAL_CREAM | Freq: Two times a day (BID) | CUTANEOUS | 0 refills | Status: DC

## 2020-06-10 MED ORDER — GUAIFENESIN-CODEINE 100-10 MG/5ML PO SOLN: 5.0000 mL | Freq: Four times a day (QID) | ORAL | 0 refills | Status: DC | PRN

## 2020-06-10 MED ORDER — BENZONATATE 100 MG PO CAPS: 100.0000 mg | ORAL_CAPSULE | Freq: Three times a day (TID) | ORAL | 0 refills | Status: DC | PRN

## 2020-06-10 MED ORDER — FERROUS SULFATE 325 (65 FE) MG PO TABS: 325.0000 mg | ORAL_TABLET | Freq: Two times a day (BID) | ORAL | 3 refills | Status: DC

## 2020-06-10 MED ORDER — METHYLPREDNISOLONE ACETATE 40 MG/ML IJ SUSP
40.0000 mg | Freq: Once | INTRAMUSCULAR | Status: AC
  Administered 2020-06-10: 40 mg via INTRAMUSCULAR

## 2020-06-10 MED ORDER — METHYLPREDNISOLONE ACETATE 40 MG/ML IJ SUSP: 40.0000 mg | Freq: Once | INTRAMUSCULAR | Status: DC

## 2020-06-10 NOTE — Patient Instructions (Signed)
Respiratory Syncytial Virus, Adult Respiratory syncytial virus (RSV) infection is an infection caused by RSV, a common virus. This virus is similar to viruses that cause the common cold and the flu. RSV infection can affect the nose, throat, windpipe, and lungs (respiratory system). When the infection is severe, it can cause:  Bronchiolitis. This condition causes inflammation of the air passages in the lungs (bronchioles).  Pneumonia. This condition causes inflammation of the air sacs in the lungs. RSV infection spreads from person to person (is contagious) through droplets from coughs and sneezes (respiratory secretions). This condition is rarely serious when it occurs in adults. What are the causes? This condition is caused by contact with the RSV. This can happen by:  Breathing respiratory secretions that fly through the air.  Touching something that has been exposed to the virus (is contaminated) and then touching your mouth, nose, or eyes.  Coming in close contact with someone who has this infection. This may happen if you: ? Hug or kiss. ? Shake or hold hands. ? Eat or drink using the same dishes or utensils. What increases the risk? The following factors may make you more likely to develop this condition:  Being 60 years of age or older.  Having certain health conditions, including: ? A long-term (chronic) lung condition, such as chronic obstructive pulmonary disease (COPD). ? An immune system that is weak. This is your body's defense system. ? Down syndrome. ? Heart disease.  Working in a hospital or other health care facility.  Living in a long-term health care facility. RVS infections are most common from the months of November to April. But they can happen any time of year. What are the signs or symptoms? Symptoms of this condition include:  Having a runny nose.  Coughing. You may have a cough that brings up mucus (productive cough).  Sneezing.  Having a  fever.  Wanting to eat less than usual.  Breathing loudly (wheezing).  Having shortness of breath.  Having fluid build up in the lungs (respiratory distress). How is this diagnosed? This condition may be diagnosed based on:  Your symptoms.  Your medical history.  A physical exam.  A chest X-ray to rule out pneumonia.  Blood tests or tests of mucus from your lungs (sputum). These tests may be done for older adults.  A test of a sample of your respiratory secretions. How is this treated? In most cases, the RSV infection will go away after 1-2 weeks of caring for yourself at home.  Sometimes, RSV infection is severe and causes pneumonia. If you develop pneumonia, you may need to be treated in the hospital. You may be given:  Oxygen therapy.  Antibiotic medicine.  Medicines to open your bronchioles (bronchodilators). Follow these instructions at home: Medicines  Take over-the-counter and prescription medicines only as told by your health care provider.  If you were prescribed an antibiotic medicine, take it as told by your health care provider. Do not stop using the antibiotic even if you start to feel better. Lifestyle  Eat a healthy diet.  Do not drink alcohol.  Do not use any products that contain nicotine or tobacco, such as cigarettes, e-cigarettes, and chewing tobacco. If you need help quitting, ask your health care provider.  Rest at home until your symptoms go away.  Return to your normal activities as told by your health care provider. Ask your health care provider what activities are safe for you. General instructions  Drink enough fluid to keep your  urine pale yellow.  Gargle with a salt-water mixture 3-4 times a day or as needed. To make a salt-water mixture, completely dissolve -1 tsp (3-6 g) of salt in 1 cup (237 mL) of warm water.  Keep all follow-up visits as told by your health care provider. This is important. How is this prevented? To prevent  catching and spreading RSV:  Wash your hands often with soap and water. If soap and water are not available, use hand sanitizer. This liquid kills germs. Do not touch your face without first cleaning your hands.  Stay home if you have symptoms of the common cold or the flu.  Cover your nose and mouth when you cough or sneeze.  Avoid large groups of people.  Keep a safe distance from people who are coughing or sneezing. Contact a health care provider if:  Your symptoms get worse or have not changed after 2 weeks.  You have: ? A fever. ? Hot flashes, sweating, or chills that keep happening. ? A cough that brings up much more mucus than usual. ? A cough that brings up blood. ? Respiratory distress that gets worse.  You feel: ? Very tired (lethargic). ? Confused. Get help right away if you have:  Respiratory distress that becomes severe.  Loss of consciousness. These symptoms may represent a serious problem that is an emergency. Do not wait to see if the symptoms will go away. Get medical help right away. Call your local emergency services (911 in the [place] Do not drive yourself to the hospital. This information is not intended to replace advice given to you by your health care provider. Make sure you discuss any questions you have with your health care provider. Document Revised: 08/03/2018 Document Reviewed: 01/12/2016 Elsevier Patient Education  2020 [employer].

## 2020-06-10 NOTE — Telephone Encounter (Signed)
Patient paged on call requesting Rx for ferrous sulfate 325 mg PO BID as was previously recommended when seen by Tye Savoy earlier this week. Rx placed, although stated that it may be cheaper to buy as OTC, so advised her to evaluate this with the pharmacy. All questions answered and appreciative of call back.

## 2020-06-10 NOTE — Progress Notes (Signed)
Subjective: CC: ED follow-up for RSV PCP: Alison Norlander, DO NUU:VOZDGUY [name] is a 69 y.o. female presenting to clinic today for:  1.  RSV, cough Patient was diagnosed with RSV on 06/03/2020.  She was negative for COVID-19 and other viral illnesses.  She reports that she has ongoing cough that is worse at nighttime.  She feels short of breath.  Does not report any fevers.  She reports fatigue.  2.  GI bleed Patient was seen by gastroenterology on 06/08/2020.  Given active RSV infection as above endoscopy and colonoscopy have been held off.  She also has multiple comorbidities which also put her at increased risk including anticoagulation.  For now, stool softeners have been recommended and close monitoring of her hemoglobin.  Hgb was 8.7 on 10/25.  3.  Sacral cyst Patient reports that she has a cyst on her buttocks that she would like me to look at.  She reports tenderness.  Since she is wheelchair-bound she is often seated during the daytime.  Denies any drainage.  No treatments thus far  ROS: Per HPI  Allergies  Allergen Reactions  . Amlodipine Swelling    Ankle swelling  . Atorvastatin Other (See Comments)    Myalgias   . Crestor [Rosuvastatin Calcium] Other (See Comments)    Stiffness and back pain  . Ezetimibe-Simvastatin Other (See Comments)    Leg cramps  . Sitagliptin Other (See Comments)  . Statins Other (See Comments)    Myalgias  . Celebrex [Celecoxib] Rash  . Levemir [Insulin Detemir] Rash   Past Medical History:  Diagnosis Date  . Atrial fibrillation ([place])    a. maintaining sinus s/Alison DCCV, on Eliquis  . CAD (coronary artery disease)    a. Nonobstructive by cath 2006 - 25% LAD, 25-30% after 1st diag, distal 25% PDA, minor irreg LCx. b. Normal nuc 2011.  Marland Kitchen CHF (congestive heart failure) ([place])   . CKD (chronic kidney disease), stage III ([place])   . Cystocele   . Difficult intubation 09-07-2012   big neck trouble with intubation parotid surgery"takes  little med to sedate"  . Esophageal stricture   . GERD (gastroesophageal reflux disease)   . Hiatal hernia    a. 07/2013: HH with small stricture holding up barium tablet (concides with patient's sx of food sticking).  . History of kidney stones   . Hyperlipidemia   . Hypertension   . IBS (irritable bowel syndrome)   . Memory difficulty 11/26/2015  . Morbid obesity ([place])    a. hx of gastric bypass (sleeve gastrectomy 2015)  . Non-STEMI (non-ST elevated myocardial infarction) ([place]) 01/16/2020  . Osteoporosis   . 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  Marland Kitchen Pericardial effusion    a. Mod by echo 2012 at time of PNA. b. Echo 07/2012: small pericardial effusion vs fat.  . Pressure ulcer 04/16/2019   right foot  . S/Alison TAVR (transcatheter aortic valve replacement)   . Severe aortic stenosis   . Sleep apnea     "have mask; don't use it" (02/14/2018)  . TIA (transient ischemic attack) 10/2014; 06/2016   "some memory issues since; daughter says my speech is sometimes different" (02/14/2018)  . Type II diabetes mellitus ([place])     Current Outpatient Medications:  .  ACCU-CHEK AVIVA PLUS test strip, USE TO CHECK BLOOD SUGAR UP TO TWICE DAILY OR AS INSTRUCTED, Disp: 100 each, Rfl: 2 .  amiodarone (PACERONE) 200 MG tablet, Take 1 tablet (  200 mg) twice daily for 2 weeks then take 1 tablet (200 mg) once daily, Disp: 60 tablet, Rfl: 0 .  B-D INS SYR ULTRAFINE 1CC/31G 31G X 5/16" 1 ML MISC, USE TO INJECT INSULIN TWICE A DAY AS INSTRUCTED (Patient taking differently: 1 Stick by Other route 3 (three) times daily before meals. ), Disp: 100 each, Rfl: 2 .  Calcium Carbonate (CALCIUM 600 PO), Take 1 tablet by mouth daily., Disp: , Rfl:  .  chlorpheniramine-HYDROcodone (TUSSIONEX PENNKINETIC ER) 10-8 MG/5ML SUER, Take 5 mLs by mouth every 12 (twelve) hours as needed for cough., Disp: 140 mL, Rfl: 0 .  Cholecalciferol (VITAMIN D3 PO), Take 1 tablet by mouth in the morning and at bedtime. , Disp: ,  Rfl:  .  Cyanocobalamin (VITAMIN B-12 PO), Take 1 tablet by mouth daily. 2 gummies by mouth daily , Disp: , Rfl:  .  docusate sodium (COLACE) 50 MG capsule, Take 2 capsules (100 mg total) by mouth 2 (two) times daily., Disp: 10 capsule, Rfl: 0 .  ELIQUIS 5 MG TABS tablet, TAKE 1 TABLET BY MOUTH TWICE A DAY, Disp: 180 tablet, Rfl: 1 .  fenofibrate (TRICOR) 145 MG tablet, TAKE 1 TABLET BY MOUTH EVERY DAY, Disp: 90 tablet, Rfl: 3 .  hydrALAZINE (APRESOLINE) 50 MG tablet, Take 1.5 tablets (75 mg total) by mouth 3 (three) times daily., Disp: 135 tablet, Rfl: 11 .  insulin NPH Human (NOVOLIN N) 100 UNIT/ML injection, Inject 20-60 Units into the skin 2 (two) times daily before a meal. 60 units in the am 60 units at lunch and 20 units for supper - on a sliding scale, Disp: , Rfl:  .  Insulin Pen Needle 32G X 4 MM MISC, Use to inject insulin with insulin pen, Disp: 100 each, Rfl: 4 .  loratadine (CLARITIN) 10 MG tablet, Take 10 mg by mouth daily. , Disp: , Rfl:  .  meclizine (ANTIVERT) 12.5 MG tablet, Take 1 tablet (12.5 mg total) by mouth 3 (three) times daily as needed for dizziness., Disp: 30 tablet, Rfl: 5 .  Multiple Vitamins-Minerals (MULTIVITAMIN GUMMIES ADULT PO), Taking two gummies by mouth daily, Disp: , Rfl:  .  NOVOLIN R RELION 100 UNIT/ML injection, Inject 20-30 Units into the skin 3 (three) times daily before meals. Per sliding scale, Disp: , Rfl:  .  nutrition supplement, JUVEN, (JUVEN) PACK, Take 1 packet by mouth 2 (two) times daily between meals., Disp: 60 packet, Rfl: 0 .  ondansetron (ZOFRAN ODT) 4 MG disintegrating tablet, Take 1 tablet (4 mg total) by mouth every 8 (eight) hours as needed for nausea., Disp: 10 tablet, Rfl: 0 .  pantoprazole (PROTONIX) 40 MG tablet, Take 1 tablet (40 mg total) by mouth daily., Disp: 30 tablet, Rfl: 11 .  Pitavastatin Calcium (LIVALO) 4 MG TABS, Take by mouth daily., Disp: , Rfl:  .  PRALUENT 75 MG/ML SOAJ, INJECT 75 MG INTO THE SKIN EVERY 14 (FOURTEEN)  DAYS., Disp: 2 pen, Rfl: 6 .  RELION INSULIN SYRINGE 31G X 15/64" 1 ML MISC, Inject 1 Syringe into the skin 3 (three) times daily., Disp: , Rfl:  .  Tafamidis (VYNDAMAX) 61 MG CAPS, Take 61 mg by mouth daily., Disp: 30 capsule, Rfl: 11 .  torsemide (DEMADEX) 20 MG tablet, Take 1 tablet (20 mg total) by mouth 2 (two) times daily., Disp: 60 tablet, Rfl: 3 .  VASCEPA 1 g capsule, TAKE 2 CAPSULES (2 G TOTAL) BY MOUTH 2 (TWO) TIMES A DAY., Disp: 120 capsule, Rfl:  6 Social History   Socioeconomic History  . Marital status: Married    Spouse name: Not on file  . Number of children: 3  . Years of education: some college  . Highest education level: Some college, no degree  Occupational History  . Occupation: retired    Fish farm manager: [employer]  Tobacco Use  . Smoking status: Never Smoker  . Smokeless tobacco: Never Used  Vaping Use  . Vaping Use: Never used  Substance and Sexual Activity  . Alcohol use: Not Currently    Alcohol/week: 0.0 standard drinks    Comment:  "mixed drink q few years"  . Drug use: Never  . Sexual activity: Yes    Birth control/protection: Post-menopausal  Other Topics Concern  . Not on file  Social History Narrative   Married   Patient is right handed.   Patient rarely drinks caffeine.   Lives at home with husband in two story home. Husband washes laundry in the basement. They have 3 daughters and 7 grandchildren.    Social Determinants of Health   Financial Resource Strain: Low Risk   . Difficulty of Paying Living Expenses: Not hard at all  Food Insecurity:   . Worried About Charity fundraiser in the Last Year: Not on file  . Ran Out of Food in the Last Year: Not on file  Transportation Needs: No Transportation Needs  . Lack of Transportation (Medical): No  . Lack of Transportation (Non-Medical): No  Physical Activity:   . Days of Exercise per Week: Not on file  . Minutes of Exercise per Session: Not on file  Stress:   . Feeling of Stress : Not on  file  Social Connections:   . Frequency of Communication with Friends and Family: Not on file  . Frequency of Social Gatherings with Friends and Family: Not on file  . Attends Religious Services: Not on file  . Active Member of Clubs or Organizations: Not on file  . Attends Archivist Meetings: Not on file  . Marital Status: Not on file  Intimate Partner Violence:   . Fear of Current or Ex-Partner: Not on file  . Emotionally Abused: Not on file  . Physically Abused: Not on file  . Sexually Abused: Not on file   Family History  Problem Relation Age of Onset  . Heart failure Mother   . Hypertension Mother   . Skin cancer Mother   . Colitis Mother   . Diabetes Mother   . Kidney Stones Mother   . Stroke Mother   . Heart disease Father   . Colon polyps Father   . Diabetes Father   . Heart failure Father   . Heart attack Sister   . Stroke Maternal Grandfather   . Diabetes Maternal Grandfather   . Diabetes Daughter        prediabetes  . Colon cancer Neg Hx     Objective: Office vital signs reviewed. BP (!) 150/56   Pulse 81   Temp (!) 97.5 F (36.4 C)   Ht 5\' 6"  (1.676 m)   Wt 207 lb 9.6 oz (94.2 kg)   SpO2 95%   BMI 33.51 kg/m   Physical Examination:  General: Awake, alert, chronically ill-appearing, No acute distress HEENT: sclera white Pulm: No wheezing, rhonchi or rales.  She is visibly coughing during today's exam. MSK: Arrives in wheelchair Skin: Sacral apex with mild erythema and maceration.  No appreciable secondary infection or palpable cysts  Assessment/ Plan:  69 y.o. female   1. RSV (respiratory syncytial virus infection) Chest x-ray obtained given ongoing coughing.  Personal view of the x-ray did not demonstrate any acute pulmonary infiltrate suggestive of pneumonia.  I have given her guaifenesin with codeine to have at nighttime.  Tessalon Perles for the daytime.  Depo-Medrol administered during today's visit.  We discussed supportive care and  reasons for reevaluation.  She voiced good understanding of follow-up as needed - DG Chest 2 View; Future - guaiFENesin-codeine 100-10 MG/5ML syrup; Take 5 mLs by mouth every 6 (six) hours as needed for cough.  Dispense: 120 mL; Refill: 0 - benzonatate (TESSALON PERLES) 100 MG capsule; Take 1 capsule (100 mg total) by mouth 3 (three) times daily as needed.  Dispense: 20 capsule; Refill: 0 - methylPREDNISolone acetate (DEPO-MEDROL) injection 40 mg  2. Intertrigo Appears to be intertrigo.  Nystatin prescribed.  Home care instructions were reviewed.  Follow-up as needed - nystatin cream (MYCOSTATIN); Apply 1 application topically 2 (two) times daily. x7 days to sacral area  Dispense: 30 g; Refill: 0  3. Blood in stool She is to continue following up with GI.  Agree with holding off on any EGDs and colonoscopies until pulmonary issues have resolved.   No orders of the defined types were placed in this encounter.  No orders of the defined types were placed in this encounter.    Alison Norlander, DO [place] [phone]

## 2020-06-11 ENCOUNTER — Other Ambulatory Visit: Payer: Self-pay

## 2020-06-11 ENCOUNTER — Telehealth: Payer: Self-pay | Admitting: Family Medicine

## 2020-06-11 DIAGNOSIS — D509 Iron deficiency anemia, unspecified: Secondary | ICD-10-CM

## 2020-06-11 LAB — BASIC METABOLIC PANEL
BUN/Creatinine Ratio: 17 (ref 12–28)
BUN: 48 mg/dL — ABNORMAL HIGH (ref 8–27)
CO2: 27 mmol/L (ref 20–29)
Calcium: 8.5 mg/dL — ABNORMAL LOW (ref 8.7–10.3)
Chloride: 94 mmol/L — ABNORMAL LOW (ref 96–106)
Creatinine, Ser: 2.81 mg/dL — ABNORMAL HIGH (ref 0.57–1.00)
GFR calc Af Amer: 19 mL/min/{1.73_m2} — ABNORMAL LOW (ref >59)
GFR calc non Af Amer: 17 mL/min/{1.73_m2} — ABNORMAL LOW (ref >59)
Glucose: 178 mg/dL — ABNORMAL HIGH (ref 65–99)
Potassium: 4 mmol/L (ref 3.5–5.2)
Sodium: 141 mmol/L (ref 134–144)

## 2020-06-11 MED FILL — VYNDAMAX 61 MG CAPS: 61 | 30 days supply | Qty: 30 | Fill #9

## 2020-06-12 NOTE — Progress Notes (Signed)
Patient notified

## 2020-06-16 ENCOUNTER — Telehealth: Payer: Self-pay | Admitting: *Deleted

## 2020-06-16 NOTE — Telephone Encounter (Signed)
06/16/2020  I received a VM from Ms Diloreto at 3:04 today. She is not enrolled in the CCM program. She stated that she has chronic lower extremity edema but that the left seems more swollen than normal and it is weeping. Her daughter told her that it looked a little like cellulitis and suggested she speak with her PCP office.   Forwarding to [facility] clinical staff for outreach to patient.   Chong Sicilian, BSN, RN-BC Embedded Chronic Care Manager [facility] [place] [facility] Direct Dial: [phone]

## 2020-06-16 NOTE — Telephone Encounter (Signed)
Attempted to contact patient - NA °

## 2020-06-17 ENCOUNTER — Encounter (HOSPITAL_COMMUNITY): Payer: Self-pay | Admitting: Emergency Medicine

## 2020-06-17 ENCOUNTER — Other Ambulatory Visit: Payer: Self-pay

## 2020-06-17 ENCOUNTER — Emergency Department (HOSPITAL_COMMUNITY)
Admission: EM | Admit: 2020-06-17 | Discharge: 2020-06-17 | Disposition: A | Discharge status: Home / Self Care | Payer: Medicare Other | Attending: Emergency Medicine | Admitting: Emergency Medicine

## 2020-06-17 DIAGNOSIS — E1165 Type 2 diabetes mellitus with hyperglycemia: Secondary | ICD-10-CM | POA: Insufficient documentation

## 2020-06-17 DIAGNOSIS — Z79899 Other long term (current) drug therapy: Secondary | ICD-10-CM | POA: Diagnosis not present

## 2020-06-17 DIAGNOSIS — E1122 Type 2 diabetes mellitus with diabetic chronic kidney disease: Secondary | ICD-10-CM | POA: Insufficient documentation

## 2020-06-17 DIAGNOSIS — Z9861 Coronary angioplasty status: Secondary | ICD-10-CM | POA: Insufficient documentation

## 2020-06-17 DIAGNOSIS — R609 Edema, unspecified: Secondary | ICD-10-CM

## 2020-06-17 DIAGNOSIS — I2510 Atherosclerotic heart disease of native coronary artery without angina pectoris: Secondary | ICD-10-CM | POA: Insufficient documentation

## 2020-06-17 DIAGNOSIS — M79662 Pain in left lower leg: Secondary | ICD-10-CM | POA: Diagnosis present

## 2020-06-17 DIAGNOSIS — Z794 Long term (current) use of insulin: Secondary | ICD-10-CM | POA: Diagnosis not present

## 2020-06-17 DIAGNOSIS — I130 Hypertensive heart and chronic kidney disease with heart failure and stage 1 through stage 4 chronic kidney disease, or unspecified chronic kidney disease: Secondary | ICD-10-CM | POA: Diagnosis not present

## 2020-06-17 DIAGNOSIS — N184 Chronic kidney disease, stage 4 (severe): Secondary | ICD-10-CM | POA: Insufficient documentation

## 2020-06-17 DIAGNOSIS — Z7901 Long term (current) use of anticoagulants: Secondary | ICD-10-CM | POA: Diagnosis not present

## 2020-06-17 DIAGNOSIS — L03116 Cellulitis of left lower limb: Secondary | ICD-10-CM | POA: Diagnosis not present

## 2020-06-17 DIAGNOSIS — I5033 Acute on chronic diastolic (congestive) heart failure: Secondary | ICD-10-CM | POA: Diagnosis not present

## 2020-06-17 LAB — COMPREHENSIVE METABOLIC PANEL
ALT: 28 U/L (ref 0–44)
AST: 33 U/L (ref 15–41)
Albumin: 2.6 g/dL — ABNORMAL LOW (ref 3.5–5.0)
Alkaline Phosphatase: 39 U/L (ref 38–126)
Anion gap: 12 (ref 5–15)
BUN: 36 mg/dL — ABNORMAL HIGH (ref 8–23)
CO2: 32 mmol/L (ref 22–32)
Calcium: 8.7 mg/dL — ABNORMAL LOW (ref 8.9–10.3)
Chloride: 96 mmol/L — ABNORMAL LOW (ref 98–111)
Creatinine, Ser: 2.79 mg/dL — ABNORMAL HIGH (ref 0.44–1.00)
GFR, Estimated: 18 mL/min — ABNORMAL LOW (ref >60)
Glucose, Bld: 146 mg/dL — ABNORMAL HIGH (ref 70–99)
Potassium: 3.3 mmol/L — ABNORMAL LOW (ref 3.5–5.1)
Sodium: 140 mmol/L (ref 135–145)
Total Bilirubin: 1.2 mg/dL (ref 0.3–1.2)
Total Protein: 6.3 g/dL — ABNORMAL LOW (ref 6.5–8.1)

## 2020-06-17 LAB — CBC WITH DIFFERENTIAL/PLATELET
Abs Immature Granulocytes: 0.1 10*3/uL — ABNORMAL HIGH (ref 0.00–0.07)
Basophils Absolute: 0 10*3/uL (ref 0.0–0.1)
Basophils Relative: 0 %
Eosinophils Absolute: 0 10*3/uL (ref 0.0–0.5)
Eosinophils Relative: 0 %
HCT: 29.6 % — ABNORMAL LOW (ref 36.0–46.0)
Hemoglobin: 8.2 g/dL — ABNORMAL LOW (ref 12.0–15.0)
Lymphocytes Relative: 5 %
Lymphs Abs: 0.6 10*3/uL — ABNORMAL LOW (ref 0.7–4.0)
MCH: 20.3 pg — ABNORMAL LOW (ref 26.0–34.0)
MCHC: 27.7 g/dL — ABNORMAL LOW (ref 30.0–36.0)
MCV: 73.3 fL — ABNORMAL LOW (ref 80.0–100.0)
Monocytes Absolute: 0.4 10*3/uL (ref 0.1–1.0)
Monocytes Relative: 3 %
Neutro Abs: 10.9 10*3/uL — ABNORMAL HIGH (ref 1.7–7.7)
Neutrophils Relative %: 91 %
Platelets: 454 10*3/uL — ABNORMAL HIGH (ref 150–400)
Promyelocytes Relative: 1 %
RBC: 4.04 MIL/uL (ref 3.87–5.11)
RDW: 22.5 % — ABNORMAL HIGH (ref 11.5–15.5)
WBC: 12 10*3/uL — ABNORMAL HIGH (ref 4.0–10.5)
nRBC: 0 % (ref 0.0–0.2)
nRBC: 0 /100 WBC

## 2020-06-17 LAB — CBG MONITORING, ED
Glucose-Capillary: 64 mg/dL — ABNORMAL LOW (ref 70–99)
Glucose-Capillary: 220 mg/dL — ABNORMAL HIGH (ref 70–99)

## 2020-06-17 MED ORDER — CEPHALEXIN 500 MG PO CAPS: 500.0000 mg | ORAL_CAPSULE | Freq: Four times a day (QID) | ORAL | 0 refills | Status: DC

## 2020-06-17 MED ORDER — ACETAMINOPHEN 325 MG PO TABS
650.0000 mg | ORAL_TABLET | Freq: Once | ORAL | Status: AC
  Administered 2020-06-17: 650 mg via ORAL
  Filled 2020-06-17: qty 2

## 2020-06-17 MED ORDER — CEPHALEXIN 250 MG PO CAPS
500.0000 mg | ORAL_CAPSULE | Freq: Once | ORAL | Status: AC
  Administered 2020-06-17: 500 mg via ORAL
  Filled 2020-06-17: qty 2

## 2020-06-17 NOTE — ED Provider Notes (Addendum)
[facility] EMERGENCY DEPARTMENT Provider Note   CSN: [id number] Arrival date & time: 06/17/20  1751     History Chief Complaint  Patient presents with   Leg Swelling    [name] is a 69 y.o. female.  The history is provided by the patient.  Leg Pain Location:  Leg Leg location:  L leg Pain details:    Quality:  Aching and dull   Severity:  Moderate   Onset quality:  Gradual   Duration:  4 days   Timing:  Constant   Progression:  Worsening Chronicity:  New Associated symptoms: no back pain and no fever   Risk factors: obesity     Patient is a 69 year old female past medical history significant for A. fib on Eliquis, CAD, CHF, CKD, HTN, HLD, morbid obesity, DM 2  Patient is presented today with left lower extremity swelling and pain that has developed over the past 4 days.  She states that she has had a bypass in his leg in the past by Dr. Algernon Huxley who is her vascular surgeon.  She states that she had had some of her toes removed as well because of diabetes and poor circulation.  She states that she knows of swelling over the past 4 days.  She states that she has 1 history of swelling in her legs and takes torsemide twice daily 20 mg she denies any missed doses.      Past Medical History:  Diagnosis Date   Atrial fibrillation ([place])    a. maintaining sinus s/p DCCV, on Eliquis   CAD (coronary artery disease)    a. Nonobstructive by cath 2006 - 25% LAD, 25-30% after 1st diag, distal 25% PDA, minor irreg LCx. b. Normal nuc 2011.   CHF (congestive heart failure) (HCC)    CKD (chronic kidney disease), stage III ([place])    Cystocele    Difficult intubation 09-07-2012   big neck trouble with intubation parotid surgery"takes little med to sedate"   Esophageal stricture    GERD (gastroesophageal reflux disease)    Hiatal hernia    a. 07/2013: HH with small stricture holding up barium tablet (concides with patient's sx of food sticking).    History of kidney stones    Hyperlipidemia    Hypertension    IBS (irritable bowel syndrome)    Memory difficulty 11/26/2015   Morbid obesity ([place])    a. 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   Pericardial effusion    a. Mod by echo 2012 at time of PNA. b. Echo 07/2012: small pericardial effusion vs fat.   Pressure ulcer 04/16/2019   right foot   S/P TAVR (transcatheter aortic valve replacement)    Severe aortic stenosis    Sleep apnea     "have mask; don't use it" (02/14/2018)   TIA (transient ischemic attack) 10/2014; 06/2016   "some memory issues since; daughter says my speech is sometimes different" (02/14/2018)   Type II diabetes mellitus [facility])     Patient Active Problem List   Diagnosis Date Noted   CKD stage 4 due to type 2 diabetes mellitus ([place]) 02/27/2020   Essential hypertension 02/26/2020   Class 3 obesity 02/26/2020   Bradycardia 02/19/2020   Hyperglycemia    Hospital discharge follow-up 01/24/2020   Non-STEMI (non-ST elevated myocardial infarction) ([place]) 01/16/2020  NSTEMI (non-ST elevated myocardial infarction) ([place]) 01/16/2020   Acute on chronic diastolic heart failure ([place]) 12/09/2019   S/P transmetatarsal amputation of foot, left ([place]) 10/23/2019   Subacute bacterial endocarditis 05/21/2019   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   Bacteremia due to Enterococcus    Pressure injury of skin 04/16/2019   Infected wound 04/16/2019   Weakness generalized 04/15/2019   SIRS (systemic inflammatory response syndrome) ([place]) 04/15/2019   Anasarca 09/19/2018   Acute encephalopathy 06/16/2018   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   Acute on chronic diastolic CHF (congestive heart failure) ([place]) 05/10/2018   Morbid obesity ([place])    Hypertension associated with diabetes ([place])     Chronic kidney disease    Sleep apnea    Hyperlipidemia    GERD (gastroesophageal reflux disease)    Persistent atrial fibrillation with rapid ventricular response (HCC)    S/P TAVR (transcatheter aortic valve replacement)    Severe aortic stenosis    Vertigo 05/06/2015   TIA (transient ischemic attack), history of 11/07/2014   History of gastric bypass 06/30/2014   CKD stage 3 due to type 2 diabetes mellitus ([place]) 06/03/2014   Type II diabetes mellitus with renal manifestations ([place]) 06/03/2014   Type II diabetes mellitus with peripheral circulatory disorder ([place]) 06/03/2014   Hyperlipidemia associated with type 2 diabetes mellitus ([place]) 12/26/2008   Benign essential HTN 12/26/2008   Coronary artery disease, non-occlusive 12/26/2008   Pericardial effusion 12/26/2008    Past Surgical History:  Procedure Laterality Date   BREATH TEK H PYLORI N/A 07/29/2013   Procedure: BREATH TEK H PYLORI;  Surgeon: Shann Medal, MD;  Location: [facility];  Service: General;  Laterality: N/A;   CARDIAC CATHETERIZATION  2006   CARDIOVERSION N/A 02/16/2018   Procedure: CARDIOVERSION;  Surgeon: Pixie Casino, MD;  Location: [facility] ENDOSCOPY;  Service: Cardiovascular;  Laterality: N/A;   CARDIOVERSION N/A 02/25/2020   Procedure: CARDIOVERSION;  Surgeon: Larey Dresser, MD;  Location: [place];  Service: Cardiovascular;  Laterality: N/A;   CARPAL TUNNEL RELEASE Left 2011   CATARACT EXTRACTION W/ INTRAOCULAR LENS  IMPLANT, BILATERAL Bilateral 2003   left   CHOLECYSTECTOMY OPEN  1982   COLONOSCOPY N/A 07/14/2015   Procedure: COLONOSCOPY;  Surgeon: Ladene Artist, MD;  Location: [facility];  Service: Endoscopy;  Laterality: N/A;   ESOPHAGOGASTRODUODENOSCOPY N/A 12/27/2013   Procedure: ESOPHAGOGASTRODUODENOSCOPY (EGD);  Surgeon: Shann Medal, MD;  Location: [facility];  Service: General;  Laterality: N/A;   ESOPHAGOGASTRODUODENOSCOPY (EGD) WITH ESOPHAGEAL DILATION      INTRAOPERATIVE TRANSTHORACIC ECHOCARDIOGRAM  05/08/2018   Procedure: INTRAOPERATIVE TRANSTHORACIC ECHOCARDIOGRAM;  Surgeon: Burnell Blanks, MD;  Location: [place];  Service: Open Heart Surgery;;   IR FLUORO GUIDE CV LINE RIGHT  04/25/2019   IR REMOVAL TUN CV CATH W/O FL  06/20/2019   IR US GUIDE VASC ACCESS RIGHT  04/25/2019   LAPAROSCOPIC GASTRIC SLEEVE RESECTION N/A 01/20/2014   Procedure: LAPAROSCOPIC GASTRIC SLEEVE RESECTION;  Surgeon: Shann Medal, MD;  Location: [facility];  Service: General;  Laterality: N/A;   PAROTIDECTOMY  09/07/2012   Procedure: PAROTIDECTOMY;  Surgeon: Melida Quitter, MD;  Location: [place];  Service: ENT;  Laterality: Left;  LEFT PAROTIDECTOMY   RIGHT HEART CATH N/A 09/25/2018   Procedure: RIGHT HEART CATH;  Surgeon: Larey Dresser, MD;  Location: [place] CV LAB;  Service: Cardiovascular;  Laterality: N/A;   RIGHT/LEFT HEART CATH AND CORONARY ANGIOGRAPHY N/A 03/29/2018   Procedure: RIGHT/LEFT HEART CATH AND CORONARY  ANGIOGRAPHY;  Surgeon: Burnell Blanks, MD;  Location: [facility] CV LAB;  Service: Cardiovascular;  Laterality: N/A;   TEE WITHOUT CARDIOVERSION N/A 02/16/2018   Procedure: TRANSESOPHAGEAL ECHOCARDIOGRAM (TEE);  Surgeon: Pixie Casino, MD;  Location: [place];  Service: Cardiovascular;  Laterality: N/A;   TEE WITHOUT CARDIOVERSION N/A 06/05/2019   Procedure: TRANSESOPHAGEAL ECHOCARDIOGRAM (TEE);  Surgeon: Sanda Klein, MD;  Location: [place];  Service: Cardiovascular;  Laterality: N/A;   TEE WITHOUT CARDIOVERSION N/A 04/24/2019   Procedure: TRANSESOPHAGEAL ECHOCARDIOGRAM (TEE);  Surgeon: Sanda Klein, MD;  Location: [place];  Service: Cardiovascular;  Laterality: N/A;   TEE WITHOUT CARDIOVERSION N/A 02/25/2020   Procedure: TRANSESOPHAGEAL ECHOCARDIOGRAM (TEE);  Surgeon: Larey Dresser, MD;  Location: [facility] ENDOSCOPY;  Service: Cardiovascular;  Laterality: N/A;   TOE AMPUTATION Left 07/2017   great toe; [facility]     TONSILLECTOMY  1968   TRANSCATHETER AORTIC VALVE REPLACEMENT, TRANSFEMORAL  05/08/2018   Transcatheter Aortic Valve Replacement    TRANSCATHETER AORTIC VALVE REPLACEMENT, TRANSFEMORAL N/A 05/08/2018   Procedure: TRANSCATHETER AORTIC VALVE REPLACEMENT, TRANSFEMORAL;  Surgeon: Burnell Blanks, MD;  Location: [place];  Service: Open Heart Surgery;  Laterality: N/A;   TUBAL LIGATION  yrs ago   UPPER GI ENDOSCOPY  01/20/2014   Procedure: UPPER GI ENDOSCOPY;  Surgeon: Shann Medal, MD;  Location: [facility];  Service: General;;     OB History    Gravida  3   Para  3   Term  3   Preterm      AB      Living  3     SAB      TAB      Ectopic      Multiple      Live Births  3           Family History  Problem Relation Age of Onset   Heart failure Mother    Hypertension Mother    Skin cancer Mother    Colitis Mother    Diabetes Mother    Kidney Stones Mother    Stroke Mother    Heart disease Father    Colon polyps Father    Diabetes Father    Heart failure Father    Heart attack Sister    Stroke Maternal Grandfather    Diabetes Maternal Grandfather    Diabetes Daughter        prediabetes   Colon cancer Neg Hx     Social History   Tobacco Use   Smoking status: Never Smoker   Smokeless tobacco: Never Used  Vaping Use   Vaping Use: Never used  Substance Use Topics   Alcohol use: Not Currently    Alcohol/week: 0.0 standard drinks    Comment:  "mixed drink q few years"   Drug use: Never    Home Medications Prior to Admission medications   Medication Sig Start Date End Date Taking? Authorizing Provider  ACCU-CHEK AVIVA PLUS test strip USE TO CHECK BLOOD SUGAR UP TO TWICE DAILY OR AS INSTRUCTED 09/04/17   Chipper Herb, MD  amiodarone (PACERONE) 200 MG tablet Take 1 tablet (200 mg) twice daily for 2 weeks then take 1 tablet (200 mg) once daily 02/27/20   Arrien, Jimmy Picket, MD  B-D INS SYR ULTRAFINE 1CC/31G 31G X 5/16" 1 ML  MISC USE TO INJECT INSULIN TWICE A DAY AS INSTRUCTED Patient taking differently: 1 Stick by Other route 3 (three) times daily before meals.  11/16/15  Chipper Herb, MD  benzonatate (TESSALON PERLES) 100 MG capsule Take 1 capsule (100 mg total) by mouth 3 (three) times daily as needed. 06/10/20   [name], DO  Calcium Carbonate (CALCIUM 600 PO) Take 1 tablet by mouth daily.    [provider]  cephALEXin (KEFLEX) 500 MG capsule Take 1 capsule (500 mg total) by mouth 4 (four) times daily. 06/17/20   Tedd Sias, PA  Cholecalciferol (VITAMIN D3 PO) Take 1 tablet by mouth in the morning and at bedtime.     [provider]  Cyanocobalamin (VITAMIN B-12 PO) Take 1 tablet by mouth daily. 2 gummies by mouth daily     [provider]  docusate sodium (COLACE) 50 MG capsule Take 2 capsules (100 mg total) by mouth 2 (two) times daily. 03/23/20   Larey Dresser, MD  ELIQUIS 5 MG TABS tablet TAKE 1 TABLET BY MOUTH TWICE A DAY 02/03/20   Minus Breeding, MD  fenofibrate (TRICOR) 145 MG tablet TAKE 1 TABLET BY MOUTH EVERY DAY 12/16/19   Larey Dresser, MD  ferrous sulfate (FERROUSUL) 325 (65 FE) MG tablet Take 1 tablet (325 mg total) by mouth 2 (two) times daily with a meal. 06/10/20   Cirigliano, Vito V, DO  guaiFENesin-codeine 100-10 MG/5ML syrup Take 5 mLs by mouth every 6 (six) hours as needed for cough. 06/10/20   [name], DO  hydrALAZINE (APRESOLINE) 50 MG tablet Take 1.5 tablets (75 mg total) by mouth 3 (three) times daily. 04/27/20   Larey Dresser, MD  insulin NPH Human (NOVOLIN N) 100 UNIT/ML injection Inject 20-60 Units into the skin 2 (two) times daily before a meal. 60 units in the am 60 units at lunch and 20 units for supper - on a sliding scale    [provider]  Insulin Pen Needle 32G X 4 MM MISC Use to inject insulin with insulin pen 05/17/16   Chipper Herb, MD  loratadine (CLARITIN) 10 MG tablet Take 10 mg by mouth daily.      [provider]  meclizine (ANTIVERT) 12.5 MG tablet Take 1 tablet (12.5 mg total) by mouth 3 (three) times daily as needed for dizziness. 06/14/19   Larey Dresser, MD  Multiple Vitamins-Minerals (MULTIVITAMIN GUMMIES ADULT PO) Taking two gummies by mouth daily    [provider]  NOVOLIN R RELION 100 UNIT/ML injection Inject 20-30 Units into the skin 3 (three) times daily before meals. Per sliding scale 02/07/20   [provider]  nutrition supplement, JUVEN, (JUVEN) PACK Take 1 packet by mouth 2 (two) times daily between meals. 04/26/19   Swayze, Ava, DO  nystatin cream (MYCOSTATIN) Apply 1 application topically 2 (two) times daily. x7 days to sacral area 06/10/20   Ronnie Doss M, DO  pantoprazole (PROTONIX) 40 MG tablet Take 1 tablet (40 mg total) by mouth daily. 05/19/20   Larey Dresser, MD  Pitavastatin Calcium (LIVALO) 4 MG TABS Take by mouth daily.    [provider]  PRALUENT 75 MG/ML SOAJ INJECT 75 MG INTO THE SKIN EVERY 14 (FOURTEEN) DAYS. 11/18/19   Larey Dresser, MD  RELION INSULIN SYRINGE 31G X 15/64" 1 ML MISC Inject 1 Syringe into the skin 3 (three) times daily. 01/04/20   [provider]  Tafamidis (VYNDAMAX) 61 MG CAPS Take 61 mg by mouth daily. 07/29/19   Larey Dresser, MD  torsemide (DEMADEX) 20 MG tablet Take 1 tablet (20 mg total) by  mouth 2 (two) times daily. 05/27/20   Larey Dresser, MD  VASCEPA 1 g capsule TAKE 2 CAPSULES (2 G TOTAL) BY MOUTH 2 (TWO) TIMES A DAY. 01/16/20   Larey Dresser, MD  Olmesartan-Amlodipine-HCTZ [facility]) 40-5-12.5 MG TABS Take 1 tablet by mouth daily.    04/12/18  [provider]    Allergies    Amlodipine, Atorvastatin, Crestor [rosuvastatin calcium], Ezetimibe-simvastatin, Sitagliptin, Statins, Celebrex [celecoxib], and Levemir [insulin detemir]  Review of Systems   Review of Systems  Constitutional: Negative for fever.  Eyes: Negative for pain.  Respiratory: Negative for  cough and shortness of breath.   Cardiovascular: Negative for chest pain and leg swelling.  Gastrointestinal: Negative for abdominal distention, abdominal pain and vomiting.  Musculoskeletal: Negative for back pain and myalgias.       Lower extremity swelling  Skin: Negative for rash.  Neurological: Negative for headaches.    Physical Exam Updated Vital Signs BP (!) 148/71 (BP Location: Left Arm)    Pulse 98    Temp 99 F (37.2 C) (Oral)    Resp 13    Ht 5\' 6"  (1.676 m)    Wt 95.3 kg    SpO2 96%    BMI 33.89 kg/m   Physical Exam Vitals and nursing note reviewed.  Constitutional:      General: She is not in acute distress.    Appearance: She is obese.     Comments: Pleasant well-appearing 69 year old.  In no acute distress.  Sitting comfortably in bed.  Able answer questions appropriately follow commands. No increased work of breathing. Speaking in full sentences.  HENT:     Head: Normocephalic and atraumatic.     Nose: Nose normal.     Mouth/Throat:     Mouth: Mucous membranes are moist.  Eyes:     General: No scleral icterus. Cardiovascular:     Rate and Rhythm: Normal rate and regular rhythm.     Pulses: Normal pulses.     Heart sounds: Normal heart sounds.     Comments: DP PT pulses confirmed by doppler Pulmonary:     Effort: Pulmonary effort is normal. No respiratory distress.     Breath sounds: No wheezing.  Abdominal:     Palpations: Abdomen is soft.     Tenderness: There is no abdominal tenderness. There is no guarding or rebound.  Musculoskeletal:     Cervical back: Normal range of motion.     Right lower leg: No edema.     Left lower leg: No edema.     Comments: There is 2+ pitting edema to the knee bilaterally. There is more swelling to the left leg and some warmth redness of the ankle and remaining parts of the left foot.  Skin:    General: Skin is warm and dry.     Capillary Refill: Capillary refill takes less than 2 seconds.  Neurological:     Mental  Status: She is alert. Mental status is at baseline.  Psychiatric:        Mood and Affect: Mood normal.        Behavior: Behavior normal.         ED Results / Procedures / Treatments   Labs (all labs ordered are listed, but only abnormal results are displayed) Labs Reviewed  COMPREHENSIVE METABOLIC PANEL - Abnormal; Notable for the following components:      Result Value   Potassium 3.3 (*)    Chloride 96 (*)    Glucose, Bld  146 (*)    BUN 36 (*)    Creatinine, Ser 2.79 (*)    Calcium 8.7 (*)    Total Protein 6.3 (*)    Albumin 2.6 (*)    GFR, Estimated 18 (*)    All other components within normal limits  CBC WITH DIFFERENTIAL/PLATELET - Abnormal; Notable for the following components:   WBC 12.0 (*)    Hemoglobin 8.2 (*)    HCT 29.6 (*)    MCV 73.3 (*)    MCH 20.3 (*)    MCHC 27.7 (*)    RDW 22.5 (*)    Platelets 454 (*)    Neutro Abs 10.9 (*)    Lymphs Abs 0.6 (*)    Abs Immature Granulocytes 0.10 (*)    All other components within normal limits  CBG MONITORING, ED - Abnormal; Notable for the following components:   Glucose-Capillary 64 (*)    All other components within normal limits  CBG MONITORING, ED - Abnormal; Notable for the following components:   Glucose-Capillary 220 (*)    All other components within normal limits    EKG None  Radiology No results found.  Procedures Procedures (including critical care time)  Medications Ordered in ED Medications  cephALEXin (KEFLEX) capsule 500 mg (500 mg Oral Given 06/17/20 2255)  acetaminophen (TYLENOL) tablet 650 mg (650 mg Oral Given 06/17/20 2255)    ED Course  I have reviewed the triage vital signs and the nursing notes.  Pertinent labs & imaging results that were available during my care of the patient were reviewed by me and considered in my medical decision making (see chart for details).  Patient has long history of bilateral lower extremity edema she has had bypass surgery done on her left lower  extremity in the past.  She is seen by vascular surgery through [facility].  She is here today with swelling and warmth to her left leg.  Suspect cellulitis. She is anticoagulated very low suspicion for DVT however after discussion with attending physician will place order for outpatient DVT study.  Clinical Course as of Jun 18 728  Wed Jun 17, 2020  2223 CBC with stable anemia.  Very mild leukocytosis of 12.  CMP with mildly hypokalemic likely secondary to loop diuretic use.  BUN and creatinine at baseline for patient.   [WF]    Clinical Course User Index [WF] Tedd Sias, [place]   MDM Rules/Calculators/A&P                          Patient treated with Keflex.  Discharged with Keflex.  She will follow up tomorrow back to [facility] for lower extremity ultrasound.  She is understanding of plan and is able to teach back plan.  I circled the phone number for her to make the appointment.  Provided with return precautions.  I discussed this case with my attending physician who cosigned this note including patient's presenting symptoms, physical exam, and planned diagnostics and interventions. Attending physician stated agreement with plan or made changes to plan which were implemented.   Attending physician assessed patient at bedside.  Final Clinical Impression(s) / ED Diagnoses Final diagnoses:  Cellulitis of left lower extremity    Rx / DC Orders ED Discharge Orders         Ordered    UE VENOUS DUPLEX        06/17/20 2231    cephALEXin (KEFLEX) 500 MG capsule  4 times  daily        06/17/20 2232           Tedd Sias, PA 06/18/20 0737    Tedd Sias, PA 06/18/20 7034    Davonna Belling, MD 06/18/20 2238

## 2020-06-17 NOTE — ED Notes (Addendum)
Pt notified staff that her sugar was reading at 65. Pt blood sugar read at 64 with staff glucometer, pt was given a lunch bag and orange juice per triage RN.

## 2020-06-17 NOTE — ED Triage Notes (Signed)
Patient arrives to ED  With complaints of increased swelling, redness, and pain to left leg x4 days. Open sores are weeping. +3 pitting edema.

## 2020-06-17 NOTE — Telephone Encounter (Signed)
Patient called me back instead of the office and left another VM around 4:50 yesterday afternoon. Please attempt to call again this morning.   Thank you Cyril Mourning

## 2020-06-17 NOTE — Telephone Encounter (Signed)
Appointment made

## 2020-06-17 NOTE — Discharge Instructions (Addendum)
Please take antibiotics as prescribed.  Please follow-up with your vascular doctor.  On my review of your notes it appears that Dr. Algernon Huxley is your cardiologist.  Please follow-up with your at [facility] vascular doctor.  At your last visit you were seen by Alda Berthold w/ vascular.   I have also requested you be scheduled for an ultrasound of your left leg tomorrow here at [place] to make sure there is no evidence of a blood clot. Please make sure to attend your appointment.

## 2020-06-17 NOTE — ED Notes (Signed)
Husband rickey [phone]

## 2020-06-18 ENCOUNTER — Emergency Department (HOSPITAL_COMMUNITY)
Admission: RE | Admit: 2020-06-18 | Discharge: 2020-06-18 | Disposition: A | Discharge status: Home / Self Care | Payer: Medicare Other | Source: Ambulatory Visit | Attending: Emergency Medicine | Admitting: Emergency Medicine

## 2020-06-18 ENCOUNTER — Ambulatory Visit: Payer: Medicare Other | Admitting: Nurse Practitioner

## 2020-06-18 DIAGNOSIS — L03116 Cellulitis of left lower limb: Secondary | ICD-10-CM | POA: Diagnosis not present

## 2020-06-18 DIAGNOSIS — R609 Edema, unspecified: Secondary | ICD-10-CM

## 2020-06-18 NOTE — Progress Notes (Signed)
Lower extremity venous has been completed.   Preliminary results in CV Proc.   Abram Sander 06/18/2020 11:08 AM

## 2020-06-22 DIAGNOSIS — E1122 Type 2 diabetes mellitus with diabetic chronic kidney disease: Secondary | ICD-10-CM | POA: Diagnosis not present

## 2020-06-22 DIAGNOSIS — I5030 Unspecified diastolic (congestive) heart failure: Secondary | ICD-10-CM | POA: Diagnosis not present

## 2020-06-22 DIAGNOSIS — N184 Chronic kidney disease, stage 4 (severe): Secondary | ICD-10-CM | POA: Diagnosis not present

## 2020-06-22 DIAGNOSIS — I129 Hypertensive chronic kidney disease with stage 1 through stage 4 chronic kidney disease, or unspecified chronic kidney disease: Secondary | ICD-10-CM | POA: Diagnosis not present

## 2020-06-23 ENCOUNTER — Telehealth: Payer: Self-pay | Admitting: *Deleted

## 2020-06-23 ENCOUNTER — Telehealth: Payer: Medicare Other | Admitting: *Deleted

## 2020-06-24 ENCOUNTER — Encounter: Payer: Self-pay | Admitting: Family Medicine

## 2020-06-24 ENCOUNTER — Ambulatory Visit (INDEPENDENT_AMBULATORY_CARE_PROVIDER_SITE_OTHER): Payer: Medicare Other | Admitting: Family Medicine

## 2020-06-24 ENCOUNTER — Other Ambulatory Visit: Payer: Self-pay

## 2020-06-24 VITALS — BP 142/56 | HR 83 | Temp 97.3°F | Ht 66.0 in

## 2020-06-24 DIAGNOSIS — B974 Respiratory syncytial virus as the cause of diseases classified elsewhere: Secondary | ICD-10-CM

## 2020-06-24 DIAGNOSIS — M7989 Other specified soft tissue disorders: Secondary | ICD-10-CM | POA: Diagnosis not present

## 2020-06-24 DIAGNOSIS — L03116 Cellulitis of left lower limb: Secondary | ICD-10-CM

## 2020-06-24 DIAGNOSIS — B338 Other specified viral diseases: Secondary | ICD-10-CM

## 2020-06-24 MED ORDER — DOXYCYCLINE HYCLATE 100 MG PO TABS: 100.0000 mg | ORAL_TABLET | Freq: Two times a day (BID) | ORAL | 0 refills | Status: DC

## 2020-06-24 MED ORDER — METHYLPREDNISOLONE ACETATE 40 MG/ML IJ SUSP
40.0000 mg | Freq: Once | INTRAMUSCULAR | Status: AC
  Administered 2020-06-24: 40 mg via INTRAMUSCULAR

## 2020-06-24 NOTE — Telephone Encounter (Signed)
  Chronic Care Management   Outreach Note  06/23/2020 Name: [name] MRN: [id number] DOB: 30-Oct-1950  Referred by: [name], DO Reason for referral : Chronic Care Management   An unsuccessful telephone follow-up was attempted today. The patient was referred to the case management team for assistance with care management and care coordination. Patient declined CCM services during initial visit, but has since told [facility] clinical staff that she is enrolled in the program. I would like to clarify this and re-enroll if desired.   Follow Up Plan: A HIPAA compliant phone message was left for the patient providing contact information and requesting a return call.  The care management team will reach out to the patient again over the next 30 days.   Chong Sicilian, BSN, RN-BC Embedded Chronic Care Manager [facility] [place] Management Direct Dial: [phone]

## 2020-06-24 NOTE — Progress Notes (Signed)
Subjective: CC: ER follow-up PCP: Alison Norlander, DO [name] is a 69 y.o. female presenting to clinic today for:  1.  ER follow-up Patient here for ER follow-up on a left leg cellulitis.  She had significant swelling, erythema and warmth on presentation on June 17, 2020.  She was placed on Keflex.  Ultrasound was negative for DVT.  She was advised to follow-up.  She notes that the swelling and redness have not really improved.  Her leg is now weeping.  She was supposed to see vascular but unfortunately missed the appointment due to getting lost.  She has another appointment scheduled in 2 weeks.  With regards to her RSV, she had symptoms are gradually getting better but she still has some dyspnea.  No fevers.  Coughing intermittently.  Her ribs hurt as a result.   ROS: Per HPI  Allergies  Allergen Reactions  . Amlodipine Swelling    Ankle swelling  . Atorvastatin Other (See Comments)    Myalgias   . Crestor [Rosuvastatin Calcium] Other (See Comments)    Stiffness and back pain  . Ezetimibe-Simvastatin Other (See Comments)    Leg cramps  . Sitagliptin Other (See Comments)  . Statins Other (See Comments)    Myalgias  . Celebrex [Celecoxib] Rash  . Levemir [Insulin Detemir] Rash   Past Medical History:  Diagnosis Date  . Atrial fibrillation ([place])    a. maintaining sinus s/p DCCV, on Eliquis  . CAD (coronary artery disease)    a. Nonobstructive by cath 2006 - 25% LAD, 25-30% after 1st diag, distal 25% PDA, minor irreg LCx. b. Normal nuc 2011.  Marland Kitchen CHF (congestive heart failure) ([place])   . CKD (chronic kidney disease), stage III ([place])   . Cystocele   . Difficult intubation 09-07-2012   big neck trouble with intubation parotid surgery"takes little med to sedate"  . Esophageal stricture   . GERD (gastroesophageal reflux disease)   . Hiatal hernia    a. 07/2013: HH with small stricture holding up barium tablet (concides with patient's sx of food sticking).  .  History of kidney stones   . Hyperlipidemia   . Hypertension   . IBS (irritable bowel syndrome)   . Memory difficulty 11/26/2015  . Morbid obesity ([place])    a. hx of gastric bypass (sleeve gastrectomy 2015)  . Non-STEMI (non-ST elevated myocardial infarction) ([place]) 01/16/2020  . Osteoporosis   . 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  Marland Kitchen Pericardial effusion    a. Mod by echo 2012 at time of PNA. b. Echo 07/2012: small pericardial effusion vs fat.  . Pressure ulcer 04/16/2019   right foot  . S/P TAVR (transcatheter aortic valve replacement)   . Severe aortic stenosis   . Sleep apnea     "have mask; don't use it" (02/14/2018)  . TIA (transient ischemic attack) 10/2014; 06/2016   "some memory issues since; daughter says my speech is sometimes different" (02/14/2018)  . Type II diabetes mellitus ([place])     Current Outpatient Medications:  .  ACCU-CHEK AVIVA PLUS test strip, USE TO CHECK BLOOD SUGAR UP TO TWICE DAILY OR AS INSTRUCTED, Disp: 100 each, Rfl: 2 .  amiodarone (PACERONE) 200 MG tablet, Take 1 tablet (200 mg) twice daily for 2 weeks then take 1 tablet (200 mg) once daily, Disp: 60 tablet, Rfl: 0 .  B-D INS SYR ULTRAFINE 1CC/31G 31G X 5/16" 1 ML MISC, USE TO INJECT INSULIN TWICE A  DAY AS INSTRUCTED (Patient taking differently: 1 Stick by Other route 3 (three) times daily before meals. ), Disp: 100 each, Rfl: 2 .  benzonatate (TESSALON PERLES) 100 MG capsule, Take 1 capsule (100 mg total) by mouth 3 (three) times daily as needed., Disp: 20 capsule, Rfl: 0 .  Calcium Carbonate (CALCIUM 600 PO), Take 1 tablet by mouth daily., Disp: , Rfl:  .  cephALEXin (KEFLEX) 500 MG capsule, Take 1 capsule (500 mg total) by mouth 4 (four) times daily., Disp: 20 capsule, Rfl: 0 .  Cholecalciferol (VITAMIN D3 PO), Take 1 tablet by mouth in the morning and at bedtime. , Disp: , Rfl:  .  Cyanocobalamin (VITAMIN B-12 PO), Take 1 tablet by mouth daily. 2 gummies by mouth daily , Disp: , Rfl:    .  docusate sodium (COLACE) 50 MG capsule, Take 2 capsules (100 mg total) by mouth 2 (two) times daily., Disp: 10 capsule, Rfl: 0 .  ELIQUIS 5 MG TABS tablet, TAKE 1 TABLET BY MOUTH TWICE A DAY, Disp: 180 tablet, Rfl: 1 .  fenofibrate (TRICOR) 145 MG tablet, TAKE 1 TABLET BY MOUTH EVERY DAY, Disp: 90 tablet, Rfl: 3 .  ferrous sulfate (FERROUSUL) 325 (65 FE) MG tablet, Take 1 tablet (325 mg total) by mouth 2 (two) times daily with a meal., Disp: 180 tablet, Rfl: 3 .  guaiFENesin-codeine 100-10 MG/5ML syrup, Take 5 mLs by mouth every 6 (six) hours as needed for cough., Disp: 120 mL, Rfl: 0 .  hydrALAZINE (APRESOLINE) 50 MG tablet, Take 1.5 tablets (75 mg total) by mouth 3 (three) times daily., Disp: 135 tablet, Rfl: 11 .  insulin NPH Human (NOVOLIN N) 100 UNIT/ML injection, Inject 20-60 Units into the skin 2 (two) times daily before a meal. 60 units in the am 60 units at lunch and 20 units for supper - on a sliding scale, Disp: , Rfl:  .  Insulin Pen Needle 32G X 4 MM MISC, Use to inject insulin with insulin pen, Disp: 100 each, Rfl: 4 .  loratadine (CLARITIN) 10 MG tablet, Take 10 mg by mouth daily. , Disp: , Rfl:  .  meclizine (ANTIVERT) 12.5 MG tablet, Take 1 tablet (12.5 mg total) by mouth 3 (three) times daily as needed for dizziness., Disp: 30 tablet, Rfl: 5 .  Multiple Vitamins-Minerals (MULTIVITAMIN GUMMIES ADULT PO), Taking two gummies by mouth daily, Disp: , Rfl:  .  NOVOLIN R RELION 100 UNIT/ML injection, Inject 20-30 Units into the skin 3 (three) times daily before meals. Per sliding scale, Disp: , Rfl:  .  nutrition supplement, JUVEN, (JUVEN) PACK, Take 1 packet by mouth 2 (two) times daily between meals., Disp: 60 packet, Rfl: 0 .  nystatin cream (MYCOSTATIN), Apply 1 application topically 2 (two) times daily. x7 days to sacral area, Disp: 30 g, Rfl: 0 .  pantoprazole (PROTONIX) 40 MG tablet, Take 1 tablet (40 mg total) by mouth daily., Disp: 30 tablet, Rfl: 11 .  Pitavastatin Calcium  (LIVALO) 4 MG TABS, Take by mouth daily., Disp: , Rfl:  .  PRALUENT 75 MG/ML SOAJ, INJECT 75 MG INTO THE SKIN EVERY 14 (FOURTEEN) DAYS., Disp: 2 pen, Rfl: 6 .  RELION INSULIN SYRINGE 31G X 15/64" 1 ML MISC, Inject 1 Syringe into the skin 3 (three) times daily., Disp: , Rfl:  .  Tafamidis (VYNDAMAX) 61 MG CAPS, Take 61 mg by mouth daily., Disp: 30 capsule, Rfl: 11 .  torsemide (DEMADEX) 20 MG tablet, Take 1 tablet (20 mg total) by  mouth 2 (two) times daily., Disp: 60 tablet, Rfl: 3 .  VASCEPA 1 g capsule, TAKE 2 CAPSULES (2 G TOTAL) BY MOUTH 2 (TWO) TIMES A DAY., Disp: 120 capsule, Rfl: 6 Social History   Socioeconomic History  . Marital status: Married    Spouse name: Not on file  . Number of children: 3  . Years of education: some college  . Highest education level: Some college, no degree  Occupational History  . Occupation: retired    Fish farm manager: [employer]  Tobacco Use  . Smoking status: Never Smoker  . Smokeless tobacco: Never Used  Vaping Use  . Vaping Use: Never used  Substance and Sexual Activity  . Alcohol use: Not Currently    Alcohol/week: 0.0 standard drinks    Comment:  "mixed drink q few years"  . Drug use: Never  . Sexual activity: Yes    Birth control/protection: Post-menopausal  Other Topics Concern  . Not on file  Social History Narrative   Married   Patient is right handed.   Patient rarely drinks caffeine.   Lives at home with husband in two story home. Husband washes laundry in the basement. They have 3 daughters and 7 grandchildren.    Social Determinants of Health   Financial Resource Strain: Low Risk   . Difficulty of Paying Living Expenses: Not hard at all  Food Insecurity:   . Worried About Charity fundraiser in the Last Year: Not on file  . Ran Out of Food in the Last Year: Not on file  Transportation Needs: No Transportation Needs  . Lack of Transportation (Medical): No  . Lack of Transportation (Non-Medical): No  Physical Activity:   .  Days of Exercise per Week: Not on file  . Minutes of Exercise per Session: Not on file  Stress:   . Feeling of Stress : Not on file  Social Connections:   . Frequency of Communication with Friends and Family: Not on file  . Frequency of Social Gatherings with Friends and Family: Not on file  . Attends Religious Services: Not on file  . Active Member of Clubs or Organizations: Not on file  . Attends Archivist Meetings: Not on file  . Marital Status: Not on file  Intimate Partner Violence:   . Fear of Current or Ex-Partner: Not on file  . Emotionally Abused: Not on file  . Physically Abused: Not on file  . Sexually Abused: Not on file   Family History  Problem Relation Age of Onset  . Heart failure Mother   . Hypertension Mother   . Skin cancer Mother   . Colitis Mother   . Diabetes Mother   . Kidney Stones Mother   . Stroke Mother   . Heart disease Father   . Colon polyps Father   . Diabetes Father   . Heart failure Father   . Heart attack Sister   . Stroke Maternal Grandfather   . Diabetes Maternal Grandfather   . Diabetes Daughter        prediabetes  . Colon cancer Neg Hx     Objective: Office vital signs reviewed. BP (!) 142/56   Pulse 83   Temp (!) 97.3 F (36.3 C)   Ht 5\' 6"  (1.676 m)   SpO2 93%   BMI 33.89 kg/m   Physical Examination:  General: Awake, alert, chronically ill-appearing female, No acute distress HEENT: Normal Cardio: regular rate and rhythm, S1S2 heard, no murmurs appreciated Pulm: clear to  auscultation bilaterally, no wheezes, rhonchi or rales; normal work of breathing on room air Extremities: She has erythema up to the left knee.  There is no significant warmth.  She has weeping of the skin.  No purulent drainage.  Pitting edema noted to knee.  Assessment/ Plan: 69 y.o. female   1. Left leg swelling I reviewed her emergency department note, photographs and recommendations.  She is getting a dose of Depo-Medrol here in office  due to discomfort due to swelling.  I considered placing an an Haematologist but hesitate given history of vascular disease with previous amputation.  She is unable to tolerate oral NSAIDs secondary to renal insufficiency.  Doxycycline prescribed in lieu of the Keflex given no improvement in her cellulitis since hospital eval.  We discussed home care instructions and reasons for return.  She voiced good understanding.  Keep follow-up with vascular - methylPREDNISolone acetate (DEPO-MEDROL) injection 40 mg - doxycycline (VIBRA-TABS) 100 MG tablet; Take 1 tablet (100 mg total) by mouth 2 (two) times daily.  Dispense: 20 tablet; Refill: 0  2. Cellulitis of left lower extremity - doxycycline (VIBRA-TABS) 100 MG tablet; Take 1 tablet (100 mg total) by mouth 2 (two) times daily.  Dispense: 20 tablet; Refill: 0  3. RSV (respiratory syncytial virus infection) Ongoing dyspnea with speech.  Lung exam was unremarkable however.  Her chest x-ray last week was unremarkable.  Overall symptoms are improving but have not totally resolved.  Doxycycline as above   No orders of the defined types were placed in this encounter.  No orders of the defined types were placed in this encounter.    Alison Norlander, DO [place] [phone]

## 2020-06-24 NOTE — Patient Instructions (Addendum)
I have switched her antibiotic to doxycycline.  Start with 1 tablet tonight and then start with 1 tablet twice daily in the morning. You are given a dose of Depo-Medrol here in the office to help with the itching and the swelling.  Try and keep that leg elevated to keep the fluid off of it. Make sure to keep your appointment with your vascular specialist. Heating pad, blue emu or bio freeze to areas of back pain.   Cellulitis, Adult  Cellulitis is a skin infection. The infected area is often warm, red, swollen, and sore. It occurs most often in the arms and lower legs. It is very important to get treated for this condition. What are the causes? This condition is caused by bacteria. The bacteria enter through a break in the skin, such as a cut, burn, insect bite, open sore, or crack. What increases the risk? This condition is more likely to occur in people who:  Have a weak body defense system (immune system).  Have open cuts, burns, bites, or scrapes on the skin.  Are older than 69 years of age.  Have a blood sugar problem (diabetes).  Have a long-lasting (chronic) liver disease (cirrhosis) or kidney disease.  Are very overweight (obese).  Have a skin problem, such as: ? Itchy rash (eczema). ? Slow movement of blood in the veins (venous stasis). ? Fluid buildup below the skin (edema).  Have been treated with high-energy rays (radiation).  Use IV drugs. What are the signs or symptoms? Symptoms of this condition include:  Skin that is: ? Red. ? Streaking. ? Spotting. ? Swollen. ? Sore or painful when you touch it. ? Warm.  A fever.  Chills.  Blisters. How is this diagnosed? This condition is diagnosed based on:  Medical history.  Physical exam.  Blood tests.  Imaging tests. How is this treated? Treatment for this condition may include:  Medicines to treat infections or allergies.  Home care, such as: ? Rest. ? Placing cold or warm cloths (compresses)  on the skin.  Hospital care, if the condition is very bad. Follow these instructions at home: Medicines  Take over-the-counter and prescription medicines only as told by your doctor.  If you were prescribed an antibiotic medicine, take it as told by your doctor. Do not stop taking it even if you start to feel better. General instructions   Drink enough fluid to keep your pee (urine) pale yellow.  Do not touch or rub the infected area.  Raise (elevate) the infected area above the level of your heart while you are sitting or lying down.  Place cold or warm cloths on the area as told by your doctor.  Keep all follow-up visits as told by your doctor. This is important. Contact a doctor if:  You have a fever.  You do not start to get better after 1-2 days of treatment.  Your bone or joint under the infected area starts to hurt after the skin has healed.  Your infection comes back. This can happen in the same area or another area.  You have a swollen bump in the area.  You have new symptoms.  You feel ill and have muscle aches and pains. Get help right away if:  Your symptoms get worse.  You feel very sleepy.  You throw up (vomit) or have watery poop (diarrhea) for a long time.  You see red streaks coming from the area.  Your red area gets larger.  Your red area  turns dark in color. These symptoms may represent a serious problem that is an emergency. Do not wait to see if the symptoms will go away. Get medical help right away. Call your local emergency services (911 in the [place] Do not drive yourself to the hospital. Summary  Cellulitis is a skin infection. The area is often warm, red, swollen, and sore.  This condition is treated with medicines, rest, and cold and warm cloths.  Take all medicines only as told by your doctor.  Tell your doctor if symptoms do not start to get better after 1-2 days of treatment. This information is not intended to replace advice  given to you by your health care provider. Make sure you discuss any questions you have with your health care provider. Document Revised: 12/21/2017 Document Reviewed: 12/21/2017 Elsevier Patient Education  [place].

## 2020-06-26 ENCOUNTER — Telehealth: Payer: Self-pay

## 2020-06-26 ENCOUNTER — Other Ambulatory Visit: Payer: Self-pay | Admitting: Family Medicine

## 2020-06-26 ENCOUNTER — Telehealth (HOSPITAL_COMMUNITY): Payer: Self-pay | Admitting: Cardiology

## 2020-06-26 DIAGNOSIS — L03116 Cellulitis of left lower limb: Secondary | ICD-10-CM

## 2020-06-26 MED ORDER — SULFAMETHOXAZOLE-TRIMETHOPRIM 400-80 MG PO TABS
1.0000 | ORAL_TABLET | Freq: Every day | ORAL | 0 refills | Status: DC
Start: 2020-06-26 — End: 2020-07-13

## 2020-06-26 NOTE — Telephone Encounter (Signed)
You seen patient 11/10- please advise

## 2020-06-26 NOTE — Telephone Encounter (Signed)
Attempted to contact patient - line busy x2 °

## 2020-06-26 NOTE — Telephone Encounter (Signed)
Patient left message on triage line Reports increased LE edema Reports she has appt with PCP  Returned call to patient no answer unable to leave message

## 2020-06-26 NOTE — Telephone Encounter (Signed)
STOP doxycycline. Septra SS sent to pharmacy.  Start today.  If worsening over weekend, needs to go to ED or UC.

## 2020-06-28 ENCOUNTER — Other Ambulatory Visit: Payer: Self-pay | Admitting: Cardiology

## 2020-06-29 ENCOUNTER — Other Ambulatory Visit (INDEPENDENT_AMBULATORY_CARE_PROVIDER_SITE_OTHER): Payer: Medicare Other

## 2020-06-29 ENCOUNTER — Ambulatory Visit: Payer: Medicare Other | Admitting: Nurse Practitioner

## 2020-06-29 DIAGNOSIS — D509 Iron deficiency anemia, unspecified: Secondary | ICD-10-CM

## 2020-06-29 LAB — CBC WITH DIFFERENTIAL/PLATELET
Basophils Absolute: 0 10*3/uL (ref 0.0–0.1)
Basophils Relative: 0.1 % (ref 0.0–3.0)
Eosinophils Absolute: 0.2 10*3/uL (ref 0.0–0.7)
Eosinophils Relative: 1.6 % (ref 0.0–5.0)
HCT: 27 % — ABNORMAL LOW (ref 36.0–46.0)
Hemoglobin: 8.3 g/dL — ABNORMAL LOW (ref 12.0–15.0)
Lymphocytes Relative: 10 % — ABNORMAL LOW (ref 12.0–46.0)
Lymphs Abs: 1 10*3/uL (ref 0.7–4.0)
MCHC: 30.8 g/dL (ref 30.0–36.0)
MCV: 70 fl — ABNORMAL LOW (ref 78.0–100.0)
Monocytes Absolute: 0.6 10*3/uL (ref 0.1–1.0)
Monocytes Relative: 6.2 % (ref 3.0–12.0)
Neutro Abs: 7.8 10*3/uL — ABNORMAL HIGH (ref 1.4–7.7)
Neutrophils Relative %: 82.1 % — ABNORMAL HIGH (ref 43.0–77.0)
Platelets: 519 10*3/uL — ABNORMAL HIGH (ref 150.0–400.0)
RBC: 3.86 Mil/uL — ABNORMAL LOW (ref 3.87–5.11)
RDW: 26.4 % — ABNORMAL HIGH (ref 11.5–15.5)
WBC: 9.5 10*3/uL (ref 4.0–10.5)

## 2020-06-29 NOTE — Telephone Encounter (Signed)
Patient aware.

## 2020-06-30 ENCOUNTER — Telehealth: Payer: Self-pay | Admitting: Gastroenterology

## 2020-06-30 ENCOUNTER — Telehealth (HOSPITAL_COMMUNITY): Payer: Self-pay | Admitting: Cardiology

## 2020-06-30 ENCOUNTER — Encounter (HOSPITAL_COMMUNITY): Payer: Medicare Other

## 2020-06-30 NOTE — Telephone Encounter (Signed)
Error

## 2020-07-02 ENCOUNTER — Encounter (HOSPITAL_COMMUNITY): Payer: Self-pay

## 2020-07-02 ENCOUNTER — Telehealth (HOSPITAL_COMMUNITY): Payer: Self-pay | Admitting: Pharmacy Technician

## 2020-07-02 ENCOUNTER — Other Ambulatory Visit: Payer: Self-pay

## 2020-07-02 ENCOUNTER — Ambulatory Visit (HOSPITAL_COMMUNITY)
Admission: RE | Admit: 2020-07-02 | Discharge: 2020-07-02 | Disposition: A | Discharge status: Home / Self Care | Payer: Medicare Other | Source: Ambulatory Visit | Attending: Adult Health | Admitting: Adult Health

## 2020-07-02 ENCOUNTER — Inpatient Hospital Stay (HOSPITAL_COMMUNITY): Payer: Medicare Other

## 2020-07-02 ENCOUNTER — Inpatient Hospital Stay (HOSPITAL_COMMUNITY)
Admission: AD | Admit: 2020-07-02 | Discharge: 2020-07-13 | DRG: 286 | Disposition: A | Discharge status: Home Health | Payer: Medicare Other | Source: Ambulatory Visit | Attending: Cardiology | Admitting: Cardiology

## 2020-07-02 ENCOUNTER — Telehealth: Payer: Self-pay

## 2020-07-02 VITALS — BP 144/72 | HR 74 | Wt 199.4 lb

## 2020-07-02 DIAGNOSIS — L03116 Cellulitis of left lower limb: Secondary | ICD-10-CM | POA: Diagnosis not present

## 2020-07-02 DIAGNOSIS — E8770 Fluid overload, unspecified: Secondary | ICD-10-CM | POA: Diagnosis not present

## 2020-07-02 DIAGNOSIS — E1169 Type 2 diabetes mellitus with other specified complication: Secondary | ICD-10-CM | POA: Diagnosis present

## 2020-07-02 DIAGNOSIS — E1165 Type 2 diabetes mellitus with hyperglycemia: Secondary | ICD-10-CM | POA: Diagnosis not present

## 2020-07-02 DIAGNOSIS — I130 Hypertensive heart and chronic kidney disease with heart failure and stage 1 through stage 4 chronic kidney disease, or unspecified chronic kidney disease: Secondary | ICD-10-CM | POA: Insufficient documentation

## 2020-07-02 DIAGNOSIS — I313 Pericardial effusion (noninflammatory): Secondary | ICD-10-CM | POA: Diagnosis present

## 2020-07-02 DIAGNOSIS — D500 Iron deficiency anemia secondary to blood loss (chronic): Secondary | ICD-10-CM | POA: Diagnosis not present

## 2020-07-02 DIAGNOSIS — I517 Cardiomegaly: Secondary | ICD-10-CM | POA: Diagnosis not present

## 2020-07-02 DIAGNOSIS — Z9119 Patient's noncompliance with other medical treatment and regimen: Secondary | ICD-10-CM

## 2020-07-02 DIAGNOSIS — Z79899 Other long term (current) drug therapy: Secondary | ICD-10-CM | POA: Insufficient documentation

## 2020-07-02 DIAGNOSIS — Z9884 Bariatric surgery status: Secondary | ICD-10-CM | POA: Insufficient documentation

## 2020-07-02 DIAGNOSIS — I272 Pulmonary hypertension, unspecified: Secondary | ICD-10-CM | POA: Diagnosis not present

## 2020-07-02 DIAGNOSIS — Z953 Presence of xenogenic heart valve: Secondary | ICD-10-CM | POA: Insufficient documentation

## 2020-07-02 DIAGNOSIS — Z888 Allergy status to other drugs, medicaments and biological substances status: Secondary | ICD-10-CM

## 2020-07-02 DIAGNOSIS — Z808 Family history of malignant neoplasm of other organs or systems: Secondary | ICD-10-CM

## 2020-07-02 DIAGNOSIS — E785 Hyperlipidemia, unspecified: Secondary | ICD-10-CM | POA: Diagnosis not present

## 2020-07-02 DIAGNOSIS — E1122 Type 2 diabetes mellitus with diabetic chronic kidney disease: Secondary | ICD-10-CM | POA: Insufficient documentation

## 2020-07-02 DIAGNOSIS — I5023 Acute on chronic systolic (congestive) heart failure: Secondary | ICD-10-CM | POA: Insufficient documentation

## 2020-07-02 DIAGNOSIS — I10 Essential (primary) hypertension: Secondary | ICD-10-CM

## 2020-07-02 DIAGNOSIS — L089 Local infection of the skin and subcutaneous tissue, unspecified: Secondary | ICD-10-CM

## 2020-07-02 DIAGNOSIS — N1832 Chronic kidney disease, stage 3b: Secondary | ICD-10-CM

## 2020-07-02 DIAGNOSIS — I43 Cardiomyopathy in diseases classified elsewhere: Secondary | ICD-10-CM | POA: Insufficient documentation

## 2020-07-02 DIAGNOSIS — Z8249 Family history of ischemic heart disease and other diseases of the circulatory system: Secondary | ICD-10-CM | POA: Insufficient documentation

## 2020-07-02 DIAGNOSIS — I70322 Atherosclerosis of unspecified type of bypass graft(s) of the extremities with rest pain, left leg: Secondary | ICD-10-CM | POA: Diagnosis present

## 2020-07-02 DIAGNOSIS — M810 Age-related osteoporosis without current pathological fracture: Secondary | ICD-10-CM | POA: Diagnosis present

## 2020-07-02 DIAGNOSIS — D631 Anemia in chronic kidney disease: Secondary | ICD-10-CM | POA: Diagnosis present

## 2020-07-02 DIAGNOSIS — K449 Diaphragmatic hernia without obstruction or gangrene: Secondary | ICD-10-CM | POA: Diagnosis present

## 2020-07-02 DIAGNOSIS — K219 Gastro-esophageal reflux disease without esophagitis: Secondary | ICD-10-CM | POA: Insufficient documentation

## 2020-07-02 DIAGNOSIS — I340 Nonrheumatic mitral (valve) insufficiency: Secondary | ICD-10-CM | POA: Diagnosis not present

## 2020-07-02 DIAGNOSIS — E11622 Type 2 diabetes mellitus with other skin ulcer: Secondary | ICD-10-CM | POA: Diagnosis present

## 2020-07-02 DIAGNOSIS — I2510 Atherosclerotic heart disease of native coronary artery without angina pectoris: Secondary | ICD-10-CM | POA: Diagnosis present

## 2020-07-02 DIAGNOSIS — L309 Dermatitis, unspecified: Secondary | ICD-10-CM | POA: Diagnosis present

## 2020-07-02 DIAGNOSIS — I129 Hypertensive chronic kidney disease with stage 1 through stage 4 chronic kidney disease, or unspecified chronic kidney disease: Secondary | ICD-10-CM

## 2020-07-02 DIAGNOSIS — Z20822 Contact with and (suspected) exposure to covid-19: Secondary | ICD-10-CM | POA: Diagnosis present

## 2020-07-02 DIAGNOSIS — Z794 Long term (current) use of insulin: Secondary | ICD-10-CM | POA: Insufficient documentation

## 2020-07-02 DIAGNOSIS — I080 Rheumatic disorders of both mitral and aortic valves: Secondary | ICD-10-CM | POA: Diagnosis present

## 2020-07-02 DIAGNOSIS — Z6832 Body mass index (BMI) 32.0-32.9, adult: Secondary | ICD-10-CM | POA: Insufficient documentation

## 2020-07-02 DIAGNOSIS — E160 Drug-induced hypoglycemia without coma: Secondary | ICD-10-CM | POA: Diagnosis not present

## 2020-07-02 DIAGNOSIS — N179 Acute kidney failure, unspecified: Secondary | ICD-10-CM | POA: Diagnosis present

## 2020-07-02 DIAGNOSIS — I5033 Acute on chronic diastolic (congestive) heart failure: Secondary | ICD-10-CM | POA: Diagnosis present

## 2020-07-02 DIAGNOSIS — G4733 Obstructive sleep apnea (adult) (pediatric): Secondary | ICD-10-CM | POA: Diagnosis present

## 2020-07-02 DIAGNOSIS — E1152 Type 2 diabetes mellitus with diabetic peripheral angiopathy with gangrene: Secondary | ICD-10-CM | POA: Diagnosis not present

## 2020-07-02 DIAGNOSIS — I350 Nonrheumatic aortic (valve) stenosis: Secondary | ICD-10-CM | POA: Diagnosis not present

## 2020-07-02 DIAGNOSIS — E11649 Type 2 diabetes mellitus with hypoglycemia without coma: Secondary | ICD-10-CM | POA: Diagnosis present

## 2020-07-02 DIAGNOSIS — I480 Paroxysmal atrial fibrillation: Secondary | ICD-10-CM | POA: Diagnosis not present

## 2020-07-02 DIAGNOSIS — I5031 Acute diastolic (congestive) heart failure: Secondary | ICD-10-CM | POA: Diagnosis not present

## 2020-07-02 DIAGNOSIS — N184 Chronic kidney disease, stage 4 (severe): Secondary | ICD-10-CM | POA: Diagnosis not present

## 2020-07-02 DIAGNOSIS — Z8673 Personal history of transient ischemic attack (TIA), and cerebral infarction without residual deficits: Secondary | ICD-10-CM

## 2020-07-02 DIAGNOSIS — R54 Age-related physical debility: Secondary | ICD-10-CM | POA: Diagnosis present

## 2020-07-02 DIAGNOSIS — L02416 Cutaneous abscess of left lower limb: Secondary | ICD-10-CM

## 2020-07-02 DIAGNOSIS — I081 Rheumatic disorders of both mitral and tricuspid valves: Secondary | ICD-10-CM | POA: Diagnosis not present

## 2020-07-02 DIAGNOSIS — T383X5A Adverse effect of insulin and oral hypoglycemic [antidiabetic] drugs, initial encounter: Secondary | ICD-10-CM | POA: Diagnosis not present

## 2020-07-02 DIAGNOSIS — I4819 Other persistent atrial fibrillation: Secondary | ICD-10-CM | POA: Diagnosis not present

## 2020-07-02 DIAGNOSIS — N183 Chronic kidney disease, stage 3 unspecified: Secondary | ICD-10-CM | POA: Diagnosis present

## 2020-07-02 DIAGNOSIS — Z7901 Long term (current) use of anticoagulants: Secondary | ICD-10-CM | POA: Insufficient documentation

## 2020-07-02 DIAGNOSIS — E854 Organ-limited amyloidosis: Secondary | ICD-10-CM | POA: Diagnosis present

## 2020-07-02 DIAGNOSIS — E118 Type 2 diabetes mellitus with unspecified complications: Secondary | ICD-10-CM | POA: Diagnosis not present

## 2020-07-02 DIAGNOSIS — L0390 Cellulitis, unspecified: Secondary | ICD-10-CM | POA: Diagnosis not present

## 2020-07-02 DIAGNOSIS — E669 Obesity, unspecified: Secondary | ICD-10-CM | POA: Diagnosis present

## 2020-07-02 DIAGNOSIS — I739 Peripheral vascular disease, unspecified: Secondary | ICD-10-CM | POA: Diagnosis not present

## 2020-07-02 DIAGNOSIS — R001 Bradycardia, unspecified: Secondary | ICD-10-CM | POA: Diagnosis present

## 2020-07-02 DIAGNOSIS — R195 Other fecal abnormalities: Secondary | ICD-10-CM | POA: Diagnosis not present

## 2020-07-02 DIAGNOSIS — R0600 Dyspnea, unspecified: Secondary | ICD-10-CM

## 2020-07-02 DIAGNOSIS — R32 Unspecified urinary incontinence: Secondary | ICD-10-CM | POA: Diagnosis not present

## 2020-07-02 DIAGNOSIS — Z823 Family history of stroke: Secondary | ICD-10-CM

## 2020-07-02 DIAGNOSIS — L97929 Non-pressure chronic ulcer of unspecified part of left lower leg with unspecified severity: Secondary | ICD-10-CM | POA: Diagnosis present

## 2020-07-02 DIAGNOSIS — I252 Old myocardial infarction: Secondary | ICD-10-CM

## 2020-07-02 DIAGNOSIS — D509 Iron deficiency anemia, unspecified: Secondary | ICD-10-CM | POA: Diagnosis present

## 2020-07-02 DIAGNOSIS — E1129 Type 2 diabetes mellitus with other diabetic kidney complication: Secondary | ICD-10-CM | POA: Diagnosis not present

## 2020-07-02 DIAGNOSIS — I342 Nonrheumatic mitral (valve) stenosis: Secondary | ICD-10-CM | POA: Diagnosis not present

## 2020-07-02 DIAGNOSIS — Z833 Family history of diabetes mellitus: Secondary | ICD-10-CM

## 2020-07-02 DIAGNOSIS — I70202 Unspecified atherosclerosis of native arteries of extremities, left leg: Secondary | ICD-10-CM | POA: Diagnosis present

## 2020-07-02 DIAGNOSIS — I509 Heart failure, unspecified: Secondary | ICD-10-CM | POA: Diagnosis not present

## 2020-07-02 DIAGNOSIS — R0602 Shortness of breath: Secondary | ICD-10-CM | POA: Diagnosis not present

## 2020-07-02 DIAGNOSIS — Z8371 Family history of colonic polyps: Secondary | ICD-10-CM

## 2020-07-02 LAB — COMPREHENSIVE METABOLIC PANEL
ALT: 36 U/L (ref 0–44)
AST: 49 U/L — ABNORMAL HIGH (ref 15–41)
Albumin: 2.7 g/dL — ABNORMAL LOW (ref 3.5–5.0)
Alkaline Phosphatase: 45 U/L (ref 38–126)
Anion gap: 12 (ref 5–15)
BUN: 58 mg/dL — ABNORMAL HIGH (ref 8–23)
CO2: 27 mmol/L (ref 22–32)
Calcium: 8.7 mg/dL — ABNORMAL LOW (ref 8.9–10.3)
Chloride: 97 mmol/L — ABNORMAL LOW (ref 98–111)
Creatinine, Ser: 3.54 mg/dL — ABNORMAL HIGH (ref 0.44–1.00)
GFR, Estimated: 13 mL/min — ABNORMAL LOW (ref >60)
Glucose, Bld: 373 mg/dL — ABNORMAL HIGH (ref 70–99)
Potassium: 3.9 mmol/L (ref 3.5–5.1)
Sodium: 136 mmol/L (ref 135–145)
Total Bilirubin: 1.1 mg/dL (ref 0.3–1.2)
Total Protein: 6.3 g/dL — ABNORMAL LOW (ref 6.5–8.1)

## 2020-07-02 LAB — CBC WITH DIFFERENTIAL/PLATELET
Abs Immature Granulocytes: 0 10*3/uL (ref 0.00–0.07)
Basophils Absolute: 0.1 10*3/uL (ref 0.0–0.1)
Basophils Relative: 1 %
Eosinophils Absolute: 0 10*3/uL (ref 0.0–0.5)
Eosinophils Relative: 0 %
HCT: 27.6 % — ABNORMAL LOW (ref 36.0–46.0)
Hemoglobin: 7.9 g/dL — ABNORMAL LOW (ref 12.0–15.0)
Lymphocytes Relative: 9 %
Lymphs Abs: 0.6 10*3/uL — ABNORMAL LOW (ref 0.7–4.0)
MCH: 21 pg — ABNORMAL LOW (ref 26.0–34.0)
MCHC: 28.6 g/dL — ABNORMAL LOW (ref 30.0–36.0)
MCV: 73.4 fL — ABNORMAL LOW (ref 80.0–100.0)
Monocytes Absolute: 0.4 10*3/uL (ref 0.1–1.0)
Monocytes Relative: 5 %
Neutro Abs: 6.1 10*3/uL (ref 1.7–7.7)
Neutrophils Relative %: 85 %
Platelets: 476 10*3/uL — ABNORMAL HIGH (ref 150–400)
RBC: 3.76 MIL/uL — ABNORMAL LOW (ref 3.87–5.11)
RDW: 23.9 % — ABNORMAL HIGH (ref 11.5–15.5)
WBC: 7.2 10*3/uL (ref 4.0–10.5)
nRBC: 0 % (ref 0.0–0.2)
nRBC: 1 /100 WBC — ABNORMAL HIGH

## 2020-07-02 LAB — RESPIRATORY PANEL BY RT PCR (FLU A&B, COVID)
Influenza A by PCR: NEGATIVE
Influenza B by PCR: NEGATIVE
SARS Coronavirus 2 by RT PCR: NEGATIVE

## 2020-07-02 LAB — GLUCOSE, CAPILLARY: Glucose-Capillary: 352 mg/dL — ABNORMAL HIGH (ref 70–99)

## 2020-07-02 LAB — MAGNESIUM: Magnesium: 2.2 mg/dL (ref 1.7–2.4)

## 2020-07-02 LAB — BRAIN NATRIURETIC PEPTIDE: B Natriuretic Peptide: 1102.7 pg/mL — ABNORMAL HIGH (ref 0.0–100.0)

## 2020-07-02 LAB — HIV ANTIBODY (ROUTINE TESTING W REFLEX): HIV Screen 4th Generation wRfx: NONREACTIVE

## 2020-07-02 LAB — TSH: TSH: 1.216 u[IU]/mL (ref 0.350–4.500)

## 2020-07-02 MED ORDER — INSULIN NPH (HUMAN) (ISOPHANE) 100 UNIT/ML ~~LOC~~ SUSP
20.0000 [IU] | Freq: Every day | SUBCUTANEOUS | Status: DC
  Administered 2020-07-02 – 2020-07-07 (×5): 20 [IU] via SUBCUTANEOUS
  Filled 2020-07-02: qty 10

## 2020-07-02 MED ORDER — ONDANSETRON HCL 4 MG/2ML IJ SOLN
4.0000 mg | Freq: Four times a day (QID) | INTRAMUSCULAR | Status: DC | PRN
  Administered 2020-07-05 (×2): 4 mg via INTRAVENOUS
  Filled 2020-07-02 (×2): qty 2

## 2020-07-02 MED ORDER — FERROUS SULFATE 325 (65 FE) MG PO TABS
325.0000 mg | ORAL_TABLET | Freq: Two times a day (BID) | ORAL | Status: DC
  Administered 2020-07-02 – 2020-07-13 (×20): 325 mg via ORAL
  Filled 2020-07-02 (×21): qty 1

## 2020-07-02 MED ORDER — HYDRALAZINE HCL 50 MG PO TABS
50.0000 mg | ORAL_TABLET | Freq: Three times a day (TID) | ORAL | Status: DC
  Administered 2020-07-02 – 2020-07-13 (×30): 50 mg via ORAL
  Filled 2020-07-02 (×32): qty 1

## 2020-07-02 MED ORDER — DOCUSATE SODIUM 100 MG PO CAPS
100.0000 mg | ORAL_CAPSULE | Freq: Two times a day (BID) | ORAL | Status: DC
  Administered 2020-07-02 – 2020-07-13 (×19): 100 mg via ORAL
  Filled 2020-07-02 (×23): qty 1

## 2020-07-02 MED ORDER — ACETAMINOPHEN 325 MG PO TABS
650.0000 mg | ORAL_TABLET | ORAL | Status: DC | PRN
  Administered 2020-07-02 – 2020-07-10 (×8): 650 mg via ORAL
  Filled 2020-07-02 (×8): qty 2

## 2020-07-02 MED ORDER — SODIUM CHLORIDE 0.9% FLUSH: 3.0000 mL | INTRAVENOUS | Status: DC | PRN

## 2020-07-02 MED ORDER — INSULIN NPH (HUMAN) (ISOPHANE) 100 UNIT/ML ~~LOC~~ SUSP
60.0000 [IU] | Freq: Two times a day (BID) | SUBCUTANEOUS | Status: DC
  Administered 2020-07-03 – 2020-07-04 (×3): 60 [IU] via SUBCUTANEOUS
  Filled 2020-07-02: qty 10

## 2020-07-02 MED ORDER — AMIODARONE HCL 200 MG PO TABS
200.0000 mg | ORAL_TABLET | Freq: Every day | ORAL | Status: DC
  Administered 2020-07-03 – 2020-07-08 (×6): 200 mg via ORAL
  Filled 2020-07-02 (×8): qty 1

## 2020-07-02 MED ORDER — ICOSAPENT ETHYL 1 G PO CAPS
2.0000 g | ORAL_CAPSULE | Freq: Two times a day (BID) | ORAL | Status: DC
  Administered 2020-07-02 – 2020-07-13 (×21): 2 g via ORAL
  Filled 2020-07-02 (×24): qty 2

## 2020-07-02 MED ORDER — PANTOPRAZOLE SODIUM 40 MG PO TBEC
40.0000 mg | DELAYED_RELEASE_TABLET | Freq: Every day | ORAL | Status: DC
  Administered 2020-07-03: 40 mg via ORAL
  Filled 2020-07-02: qty 1

## 2020-07-02 MED ORDER — FENOFIBRATE 160 MG PO TABS
160.0000 mg | ORAL_TABLET | Freq: Every day | ORAL | Status: DC
  Administered 2020-07-03 – 2020-07-11 (×9): 160 mg via ORAL
  Filled 2020-07-02 (×9): qty 1

## 2020-07-02 MED ORDER — APIXABAN 5 MG PO TABS
5.0000 mg | ORAL_TABLET | Freq: Two times a day (BID) | ORAL | Status: DC
  Administered 2020-07-02: 5 mg via ORAL
  Filled 2020-07-02 (×2): qty 1

## 2020-07-02 MED ORDER — SODIUM CHLORIDE 0.9 % IV SOLN: 250.0000 mL | INTRAVENOUS | Status: DC | PRN

## 2020-07-02 MED ORDER — FUROSEMIDE 10 MG/ML IJ SOLN
80.0000 mg | Freq: Two times a day (BID) | INTRAMUSCULAR | Status: DC
  Administered 2020-07-02: 80 mg via INTRAVENOUS
  Filled 2020-07-02 (×2): qty 8

## 2020-07-02 MED ORDER — INSULIN ASPART 100 UNIT/ML ~~LOC~~ SOLN
0.0000 [IU] | Freq: Every day | SUBCUTANEOUS | Status: DC
  Administered 2020-07-02 – 2020-07-05 (×2): 5 [IU] via SUBCUTANEOUS
  Administered 2020-07-07: 2 [IU] via SUBCUTANEOUS

## 2020-07-02 MED ORDER — INSULIN NPH (HUMAN) (ISOPHANE) 100 UNIT/ML ~~LOC~~ SUSP: 20.0000 [IU] | Freq: Two times a day (BID) | SUBCUTANEOUS | Status: DC

## 2020-07-02 MED ORDER — SODIUM CHLORIDE 0.9% FLUSH
3.0000 mL | Freq: Two times a day (BID) | INTRAVENOUS | Status: DC
  Administered 2020-07-02 – 2020-07-04 (×5): 3 mL via INTRAVENOUS

## 2020-07-02 MED ORDER — MECLIZINE HCL 25 MG PO TABS: 12.5000 mg | ORAL_TABLET | Freq: Three times a day (TID) | ORAL | Status: DC | PRN

## 2020-07-02 MED ORDER — VANCOMYCIN HCL IN DEXTROSE 1-5 GM/200ML-% IV SOLN
1000.0000 mg | INTRAVENOUS | Status: DC
  Administered 2020-07-04 – 2020-07-06 (×2): 1000 mg via INTRAVENOUS
  Filled 2020-07-02 (×2): qty 200

## 2020-07-02 MED ORDER — TAFAMIDIS 61 MG PO CAPS
61.0000 mg | ORAL_CAPSULE | Freq: Every day | ORAL | Status: DC
  Administered 2020-07-03 – 2020-07-11 (×7): 61 mg via ORAL
  Filled 2020-07-02 (×12): qty 1

## 2020-07-02 MED ORDER — INSULIN ASPART 100 UNIT/ML ~~LOC~~ SOLN
0.0000 [IU] | Freq: Three times a day (TID) | SUBCUTANEOUS | Status: DC
  Administered 2020-07-03: 2 [IU] via SUBCUTANEOUS
  Administered 2020-07-04: 3 [IU] via SUBCUTANEOUS
  Administered 2020-07-05: 1 [IU] via SUBCUTANEOUS
  Administered 2020-07-05: 9 [IU] via SUBCUTANEOUS
  Administered 2020-07-05: 3 [IU] via SUBCUTANEOUS
  Administered 2020-07-06: 1 [IU] via SUBCUTANEOUS
  Administered 2020-07-06: 5 [IU] via SUBCUTANEOUS
  Administered 2020-07-07: 2 [IU] via SUBCUTANEOUS
  Administered 2020-07-07: 5 [IU] via SUBCUTANEOUS
  Administered 2020-07-07: 3 [IU] via SUBCUTANEOUS
  Administered 2020-07-09: 2 [IU] via SUBCUTANEOUS
  Administered 2020-07-09: 7 [IU] via SUBCUTANEOUS
  Administered 2020-07-10: 3 [IU] via SUBCUTANEOUS
  Administered 2020-07-10: 5 [IU] via SUBCUTANEOUS
  Administered 2020-07-10: 3 [IU] via SUBCUTANEOUS
  Administered 2020-07-11: 9 [IU] via SUBCUTANEOUS
  Administered 2020-07-11: 3 [IU] via SUBCUTANEOUS
  Administered 2020-07-11: 2 [IU] via SUBCUTANEOUS
  Administered 2020-07-12: 5 [IU] via SUBCUTANEOUS
  Administered 2020-07-12: 2 [IU] via SUBCUTANEOUS
  Administered 2020-07-13: 7 [IU] via SUBCUTANEOUS
  Administered 2020-07-13: 1 [IU] via SUBCUTANEOUS

## 2020-07-02 MED ORDER — VANCOMYCIN HCL 1250 MG/250ML IV SOLN
1250.0000 mg | Freq: Once | INTRAVENOUS | Status: AC
  Administered 2020-07-02: 1250 mg via INTRAVENOUS
  Filled 2020-07-02: qty 250

## 2020-07-02 NOTE — Addendum Note (Signed)
Encounter addended by: Marlise Eves, RN on: 07/02/2020 2:51 PM  Actions taken: Vitals modified

## 2020-07-02 NOTE — H&P (Addendum)
H&P ADVANCED HEART FAILURE   [name] is a 69 y.o. female who has a history of AS s/p TAVR, chronic diastolic CHF, PAF, CKD III, HLD, HTN, OSA, and morbid obesity.   She was admitted from [facility] office2/5/20with marked volume overload. She diuresed 36 lbs with lasix drip and was transitioned to torsemide 40 mg daily. Course complicated by AKI. RHC completedand showed elevated filling pressures and preserved cardiac output.PYP scan suggestive of TTR amyloid. Discharge weight was 208 pounds.   She developed a non-healing right heal ulcer followed by a podiatrist in [place].  She ended up having a peripheral angiogram in [place] with PTCA to the right popliteal and right AT.    In 9/20, she was admitted with fever and found to have Enterococcal bacteremia.  TEE was concerning for possible MV vegetation, bioprosthetic aortic valve looked ok.  She was treated with IV antibiotics, and repeat TEE in 10/20 did not show residual active endocarditis.    She is now s/p left distal SFA-tibioperoneal trunk bypass with left TMA done at [facility] in 2/21.   She was admitted in 7/21 junctional bradycardia/hypotension.  Coreg and amiodarone stopped.  She went into atrial fibrillation and ended up getting TEE-guided DCCV.  Amiodarone was restarted.  She was also treated for PNA and UTI.   She had RSV 05/2020.   Evaluated in the ED on 06/17/2020 for LLE redness.  Doppler was negative for DVT. Treated for cellulitis with keflex. She has had couple rounds of antibiotics with no improvement in LLE.    Today she returns for HF follow up.Overall feeling terrible. Complaining of back and LLE wound. Copious serous exudate from LLE. SOB with minimal exertion. + orthopnea. Denies PND. Appetite fair. No fever or chills.  Having a hard time weighing at home.   She hasnt had any medications today.   Labs (11/19): LDL 115 Labs (2/20): K 4.4, creatinine 2.39, myeloma panel negative Labs (7/20):  LDL 150, triglycerides 422, K 4.1, creatinine 2.97 => 2.06 Labs (11/20): K 3.8, creatinine 2.48 Labs (12/20): K 4.4, creatinine 2.75, LDL 33, TGs 457 Labs (2/21): LDL 35, TGs 229, HDL 36 Labs (7/21): K 4.2, creatinine 2.9 Labs (8/21): LDL 53, TGs 140, AST 77, ALT 65 Labs (9/21): K 4.1, creatinine 2.66, hgb 9.3, TSH normal  PMH: 1. Chronic diastolic CHF: Echo (6/27) with EF 60-65%, moderate LVH, mildly dilated RV with mildly decreased systolic function, bioprosthetic aortic valve s/p TAVR appeared to function normally.  - RHC (2/20): mean RA 13, PA 50/21 mean 33, PCWP mean 25, CI 2.92, PVR 1.3 WU  - TEE (10/20): EF 60-65%, mild LVH, mild MR, s/p TAVR with no stenosis and mild peri-valvular leakage, no active endocarditis.  - TEE (7/21): EF 55-60%, mild LVH, PASP 66, bioprosthetic aortic valve with mean gradient 11 mmHg, old mitral valve vegetation, mild-moderate MR with mild MS.  2. Cardiac amyloidosis: TTR amyloidosis.   - PYP scan (2/20): grade 2, H/CL ratio 1.8 => suggestive of TTR amyloidosis.  - myeloma panel negative.  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  3. Bioprosthetic aortic valve s/p TAVR.  4. CKD: Stage 3.  5. Atrial fibrillation: Paroxysmal.  - TEE-guided DCCV in 7/21.  6. Hyperlipidemia: unable to tolerate Crestor or Vascepa.  7. GERD with hiatal hernia 8. HTN 9. Obesity s/p gastric bypass in 2015.  10. Type 2 diabetes 11. OSA: Unable to tolerate CPAP.  12. CAD: LHC (8/19) with 50% proximal RCA, 50% proximal LCx, 65%  mid LCx, 99% distal LAD (medical management).  13. Carotid dopplers (8/19) with mild disease.  14. PAD: Peripheral arterial angiography in 7/20 with PTCA right popliteal artery and right AT.  - S/p left distal SFA-TP trunk bypass and left TMA in 2/21.  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  16. Mitral valve disease: TEE in 7/21 with mild-moderate MR, mild MS.  17. Junctional bradycardia: Symptomatic in setting of  Coreg + amiodarone.          Current Outpatient Medications  Medication Sig Dispense Refill  . ACCU-CHEK AVIVA PLUS test strip USE TO CHECK BLOOD SUGAR UP TO TWICE DAILY OR AS INSTRUCTED 100 each 2  . amiodarone (PACERONE) 200 MG tablet Take 200 mg by mouth daily.    . B-D INS SYR ULTRAFINE 1CC/31G 31G X 5/16" 1 ML MISC USE TO INJECT INSULIN TWICE A DAY AS INSTRUCTED (Patient taking differently: 1 Stick by Other route 3 (three) times daily before meals. ) 100 each 2  . benzonatate (TESSALON PERLES) 100 MG capsule Take 1 capsule (100 mg total) by mouth 3 (three) times daily as needed. 20 capsule 0  . Calcium Carbonate (CALCIUM 600 PO) Take 1 tablet by mouth daily.    . Cholecalciferol (VITAMIN D3 PO) Take 1 tablet by mouth in the morning and at bedtime.     . Cyanocobalamin (VITAMIN B-12 PO) Take 1 tablet by mouth daily. 2 gummies by mouth daily     . docusate sodium (COLACE) 50 MG capsule Take 2 capsules (100 mg total) by mouth 2 (two) times daily. 10 capsule 0  . ELIQUIS 5 MG TABS tablet TAKE 1 TABLET BY MOUTH TWICE A DAY 180 tablet 1  . fenofibrate (TRICOR) 145 MG tablet TAKE 1 TABLET BY MOUTH EVERY DAY 90 tablet 3  . ferrous sulfate (FERROUSUL) 325 (65 FE) MG tablet Take 1 tablet (325 mg total) by mouth 2 (two) times daily with a meal. 180 tablet 3  . guaiFENesin-codeine 100-10 MG/5ML syrup Take 5 mLs by mouth every 6 (six) hours as needed for cough. 120 mL 0  . hydrALAZINE (APRESOLINE) 50 MG tablet Take 50 mg by mouth 3 (three) times daily.    . insulin NPH Human (NOVOLIN N) 100 UNIT/ML injection Inject 20-60 Units into the skin 2 (two) times daily before a meal. 60 units in the am 60 units at lunch and 20 units for supper - on a sliding scale    . Insulin Pen Needle 32G X 4 MM MISC Use to inject insulin with insulin pen 100 each 4  . loratadine (CLARITIN) 10 MG tablet Take 10 mg by mouth daily.     . meclizine (ANTIVERT) 12.5 MG tablet Take 1 tablet (12.5 mg total) by mouth  3 (three) times daily as needed for dizziness. 30 tablet 5  . Multiple Vitamins-Minerals (MULTIVITAMIN GUMMIES ADULT PO) Taking two gummies by mouth daily    . NOVOLIN R RELION 100 UNIT/ML injection Inject 20-30 Units into the skin 3 (three) times daily before meals. Per sliding scale    . nystatin cream (MYCOSTATIN) Apply 1 application topically 2 (two) times daily. x7 days to sacral area 30 g 0  . pantoprazole (PROTONIX) 40 MG tablet Take 1 tablet (40 mg total) by mouth daily. 30 tablet 11  . Pitavastatin Calcium (LIVALO) 4 MG TABS Take by mouth daily.    Marland Kitchen PRALUENT 75 MG/ML SOAJ INJECT 75 MG INTO THE SKIN EVERY 14 (FOURTEEN) DAYS. 2 pen 6  .  RELION INSULIN SYRINGE 31G X 15/64" 1 ML MISC Inject 1 Syringe into the skin 3 (three) times daily.    Marland Kitchen sulfamethoxazole-trimethoprim (BACTRIM) 400-80 MG tablet Take 1 tablet by mouth daily. (renally dosed) 7 tablet 0  . Tafamidis (VYNDAMAX) 61 MG CAPS Take 61 mg by mouth daily. 30 capsule 11  . torsemide (DEMADEX) 20 MG tablet Take 1 tablet (20 mg total) by mouth 2 (two) times daily. 60 tablet 3  . VASCEPA 1 g capsule TAKE 2 CAPSULES (2 G TOTAL) BY MOUTH 2 (TWO) TIMES A DAY. 120 capsule 6  . nutrition supplement, JUVEN, (JUVEN) PACK Take 1 packet by mouth 2 (two) times daily between meals. (Patient not taking: Reported on 07/02/2020) 60 packet 0   No current facility-administered medications for this encounter.    Allergies:   Amlodipine, Atorvastatin, Crestor [rosuvastatin calcium], Ezetimibe-simvastatin, Sitagliptin, Statins, Celebrex [celecoxib], and Levemir [insulin detemir]   Social History:  The patient  reports that she has never smoked. She has never used smokeless tobacco. She reports previous alcohol use. She reports that she does not use drugs.   Family History:  The patient's family history includes Colitis in her mother; Colon polyps in her father; Diabetes in her daughter, father, maternal grandfather, and mother; Heart attack  in her sister; Heart disease in her father; Heart failure in her father and mother; Hypertension in her mother; Kidney Stones in her mother; Skin cancer in her mother; Stroke in her maternal grandfather and mother.   ROS:  Please see the history of present illness.   All other systems are personally reviewed and negative.     Vitals:   07/02/20 1334  BP: (!) 208/96  Pulse: 87  SpO2: 97%      Wt Readings from Last 3 Encounters:  07/02/20 90.4 kg (199 lb 6.4 oz)  06/17/20 95.3 kg (210 lb)  06/10/20 94.2 kg (207 lb 9.6 oz)    Exam:   BP (!) 208/96   Pulse 87   Wt 90.4 kg (199 lb 6.4 oz)   SpO2 97%   BMI 32.18 kg/m  General:  Appears weak. Dyspneic when talking.  HEENT: normal Neck: supple. JVP 11-12 . Carotids 2+ bilat; no bruits. No lymphadenopathy or thryomegaly appreciated. Cor: PMI nondisplaced. Regular rate & rhythm. No rubs, gallops or murmurs. Lungs: clear Abdomen: soft, nontender, nondistended. No hepatosplenomegaly. No bruits or masses. Good bowel sounds. Extremities: no cyanosis, clubbing, rash, R and LLE 2-3+ edema. LLE with partinal thickness wound on anterior and posterior wound LLE erythema noted. L TMA Neuro: alert & orientedx3, cranial nerves grossly intact. moves all 4 extremities w/o difficulty. Affect flat   EKG: Junction rhythm 78 bpm     Recent Labs: 02/27/2020: Magnesium 2.0 04/21/2020: TSH 1.210 06/03/2020: B Natriuretic Peptide 718.7 06/17/2020: ALT 28; BUN 36; Creatinine, Ser 2.79; Potassium 3.3; Sodium 140 06/29/2020: Hemoglobin 8.3 Repeated and verified X2.; Platelets 519.0  Personally reviewed      Wt Readings from Last 3 Encounters:  07/02/20 90.4 kg (199 lb 6.4 oz)  06/17/20 95.3 kg (210 lb)  06/10/20 94.2 kg (207 lb 9.6 oz)    ASSESSMENT AND PLAN:  1.Acute/Chronic diastolic CHF: TEE in 0/35 showed EF 55-60%, mild LVH, stable bioprosthetic aortic valve s/p TAVR.PYP scan in 2/20 suggestive of transthyretin amyloidosis, genetic  testing suggestive of wild type.   - Marked volume overload. Will need admit to diurese with 80 mg IV lasix twice a dya.  2. CKD stage 3b:  Creatinine baseline 2.6-2.8  3. CAD: Cath in 8/19 with moderate RCA and LCx disease, severe distal LAD disease, medical management.   - No chest pain.  - No ASA given apixaban use.  - Continue pitivastatin 4 mg daily and Praluent.    - Continue fenofibrate and vascepa.  4. Atrial fibrillation: Paroxysmal.  - Check EKG   - Continue Eliquis. - Continue amiodarone 200 mg daily.   - Check TSH, CMET   5. HTN: Uncontrolled but has not had meds today.    She is off Coreg with junctional bradycardia at 7/21 admission.  - She cannot take amlodipine due to peripheral edema formation.  - Hold off on ACEI/ARB with CKD.  -Continue hydralazine 50 mg three times a day. 6. PAD: S/p PTCA to right popliteal and right AT in 7/20.  Now with left distal SFA-TP trunk bypass and left TMA in 2/21.   - She has followup with vascular surgery at [facility].  7. 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  - She is on tafamidis, continue   8. 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   9. Enterococcal endocarditis: Involving mitral valve, 7/21 TEE showed possible old calcified vegetation.    10. Hyperlipidemia: Lipids good on 8/21 panel, TGs controlled now.  Continue current regimen.  11. Mitral valve disease: TEE in 7/21 with mild-moderate MR, mild MS.   12. GERD:  - Continue Protonix 40 mg daily.  13. LLE Cellulitis Completed 2 rounds or oral antibiotics. Will need IV antibiotics.  - Check ABI if possible. Not sure she will tolerate with wound and edema.  14. DMII 04/2020 Hgb A1C 9.9  Use SSI. Check hgb A1C.   Consult Hospitalist to assist with cellulitis.  Admit with marked volume overload. Check CMET, CBC, BNP, TSH, HIV, Hgb A 1C. Check blood cultures.     Discussed with Dr Aundra Dubin Admit to progressive care.    Jeanmarie Hubert, NP  07/02/2020    Patient seen with NP, agree with the above note.   Patient presented to clinic today with increased dyspnea and lower extremity swelling.  The left lower leg has an ulceration with drainage and is erythematous despite recent antibiotic course.  BP markedly high, did not take her hydralazine today.   General: NAD Neck: Thick but JVP elevated 12-14 cm, no thyromegaly or thyroid nodule.  Lungs: Crackles at bases.  CV: Nondisplaced PMI.  Heart regular S1/S2, no S3/S4, no murmur.  2+ edema to knees bilaterally.  No carotid bruit.  Unable to palpate pedal pulses.  Abdomen: Soft, nontender, no hepatosplenomegaly, no distention.  Skin: Intact without lesions or rashes.  Neurologic: Alert and oriented x 3.  Psych: Normal affect. Extremities: No clubbing or cyanosis.  HEENT: Normal.   Agree with admission.  Diastolic CHF with suspected TTR cardiac amyloidosis. S/p TAVR, h/o PAF. Patient is markedly volume overloaded on exam, concern for cellulitis left lower leg.  CHF management is complicated by CKD stage IIIb.   - Send labs: BMET, CBC, blood cultures.   - Lasix 80 mg IV bid, follow creatinine carefully.  - Restart hydralazine 50 mg tid for elevated BP, may need to titrate up.  - Continue home tafamidis for amyloidosis.  - She is in junctional rhythm today, stable.  Has had in the past.  Continue amiodarone 200 daily.  - Continue anticoagulation with apixaban for PAF.  - Will need cellulitis coverage, will consult wound care for  ulceration/weeping and will consult [facility].  Start vancomycin.  - Concern that PAD could be contributing to LLE wounds, will arrange for peripheral arterial dopplers.   [name] 07/02/2020 5:11 PM

## 2020-07-02 NOTE — Progress Notes (Signed)
Pharmacy Antibiotic Note  [name] is a 69 y.o. female admitted on 07/02/2020 with cellulitis.  Pharmacy has been consulted for Vancomycin dosing.  Plan: Vancomycin 1250 mg iv x 1 then 1 gram iv Q 48 hours Follow Scr, progress  Weight: 90.8 kg (200 lb 2.8 oz)  Temp (24hrs), Avg:97.9 F (36.6 C), Min:97.9 F (36.6 C), Max:97.9 F (36.6 C)  Recent Labs  Lab 06/29/20 1106 07/02/20 1628  WBC 9.5 7.2    Estimated Creatinine Clearance: 21.6 mL/min (A) (by C-G formula based on SCr of 2.79 mg/dL (H)).    Allergies  Allergen Reactions  . Amlodipine Swelling    Ankle swelling  . Atorvastatin Other (See Comments)    Myalgias   . Crestor [Rosuvastatin Calcium] Other (See Comments)    Stiffness and back pain  . Ezetimibe-Simvastatin Other (See Comments)    Leg cramps  . Sitagliptin Other (See Comments)  . Statins Other (See Comments)    Myalgias  . Celebrex [Celecoxib] Rash  . Levemir [Insulin Detemir] Rash    Thank you Anette Guarneri, PharmD  07/02/2020 5:11 PM

## 2020-07-02 NOTE — Telephone Encounter (Signed)
Both of the patient's Amyloid grants will be ending soon. Went ahead and placed her on the PAN wait list. Wait List ID: [id number].  Patient is currently admitted, will try to get her to sign [employer] patient assistance paperwork on Friday, if she is able.   Will follow up.

## 2020-07-02 NOTE — Progress Notes (Addendum)
Date:  07/02/2020   ID:  VETTA COUZENS, DOB 30-Nov-1950, MRN [id number]    Provider location: [place] [facility] Type of Visit: Established patient   PCP:  [name], DO  Cardiologist:  Minus Breeding, MD Primary HF: Dr. Aundra Dubin  Chief Complaint: Shortness of breath   History of Present Illness: [name] is a 69 y.o. female who has a history of AS s/p TAVR, chronic diastolic CHF, PAF, CKD III, HLD, HTN, OSA, and morbid obesity.   She was admitted from [facility] office 09/19/18 with marked volume overload. She diuresed 36 lbs with lasix drip and was transitioned to torsemide 40 mg daily. Course complicated by AKI. RHC completed and showed elevated filling pressures and preserved cardiac output.  PYP scan suggestive of TTR amyloid. Discharge weight was 208 pounds.   She developed a non-healing right heal ulcer followed by a podiatrist in [place].  She ended up having a peripheral angiogram in [place] with PTCA to the right popliteal and right AT.    In 9/20, she was admitted with fever and found to have Enterococcal bacteremia.  TEE was concerning for possible MV vegetation, bioprosthetic aortic valve looked ok.  She was treated with IV antibiotics, and repeat TEE in 10/20 did not show residual active endocarditis.    She is now s/p left distal SFA-tibioperoneal trunk bypass with left TMA done at [facility] in 2/21.   She was admitted in 7/21 junctional bradycardia/hypotension.  Coreg and amiodarone stopped.  She went into atrial fibrillation and ended up getting TEE-guided DCCV.  Amiodarone was restarted.  She was also treated for PNA and UTI.   She had RSV 05/2020.   Evaluated in the ED on 06/17/2020 for LLE redness.  Doppler was negative for DVT. Treated for cellulitis with keflex. She has had couple rounds of antibiotics with no improvement in LLE.    Today she returns for HF follow up.Overall feeling terrible. Complaining of back and LLE  wound. Copious serous exudate from LLE. SOB with minimal exertion. + orthopnea. Denies PND. Appetite fair. No fever or chills.  Having a hard time weighing at home.   She hasnt had any medications today.   Labs (11/19): LDL 115 Labs (2/20): K 4.4, creatinine 2.39, myeloma panel negative Labs (7/20): LDL 150, triglycerides 422, K 4.1, creatinine 2.97 => 2.06 Labs (11/20): K 3.8, creatinine 2.48 Labs (12/20): K 4.4, creatinine 2.75, LDL 33, TGs 457 Labs (2/21): LDL 35, TGs 229, HDL 36 Labs (7/21): K 4.2, creatinine 2.9 Labs (8/21): LDL 53, TGs 140, AST 77, ALT 65 Labs (9/21): K 4.1, creatinine 2.66, hgb 9.3, TSH normal  PMH: 1. Chronic diastolic CHF: Echo (6/19) with EF 60-65%, moderate LVH, mildly dilated RV with mildly decreased systolic function, bioprosthetic aortic valve s/p TAVR appeared to function normally.  - RHC (2/20): mean RA 13, PA 50/21 mean 33, PCWP mean 25, CI 2.92, PVR 1.3 WU  - TEE (10/20): EF 60-65%, mild LVH, mild MR, s/p TAVR with no stenosis and mild peri-valvular leakage, no active endocarditis.  - TEE (7/21): EF 55-60%, mild LVH, PASP 66, bioprosthetic aortic valve with mean gradient 11 mmHg, old mitral valve vegetation, mild-moderate MR with mild MS.  2. Cardiac amyloidosis: TTR amyloidosis.   - PYP scan (2/20): grade 2, H/CL ratio 1.8 => suggestive of TTR amyloidosis.  - myeloma panel negative.  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  3. Bioprosthetic aortic valve s/p TAVR.  4. CKD:  Stage 3.  5. Atrial fibrillation: Paroxysmal.  - TEE-guided DCCV in 7/21.  6. Hyperlipidemia: unable to tolerate Crestor or Vascepa.  7. GERD with hiatal hernia 8. HTN 9. Obesity s/p gastric bypass in 2015.  10. Type 2 diabetes 11. OSA: Unable to tolerate CPAP.  12. CAD: LHC (8/19) with 50% proximal RCA, 50% proximal LCx, 65% mid LCx, 99% distal LAD (medical management).  13. Carotid dopplers (8/19) with mild disease.  14. PAD: Peripheral arterial angiography in  7/20 with PTCA right popliteal artery and right AT.  - S/p left distal SFA-TP trunk bypass and left TMA in 2/21.  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  16. Mitral valve disease: TEE in 7/21 with mild-moderate MR, mild MS.  17. 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    Current Outpatient Medications  Medication Sig Dispense Refill  . ACCU-CHEK AVIVA PLUS test strip USE TO CHECK BLOOD SUGAR UP TO TWICE DAILY OR AS INSTRUCTED 100 each 2  . amiodarone (PACERONE) 200 MG tablet Take 200 mg by mouth daily.    . B-D INS SYR ULTRAFINE 1CC/31G 31G X 5/16" 1 ML MISC USE TO INJECT INSULIN TWICE A DAY AS INSTRUCTED (Patient taking differently: 1 Stick by Other route 3 (three) times daily before meals. ) 100 each 2  . benzonatate (TESSALON PERLES) 100 MG capsule Take 1 capsule (100 mg total) by mouth 3 (three) times daily as needed. 20 capsule 0  . Calcium Carbonate (CALCIUM 600 PO) Take 1 tablet by mouth daily.    . Cholecalciferol (VITAMIN D3 PO) Take 1 tablet by mouth in the morning and at bedtime.     . Cyanocobalamin (VITAMIN B-12 PO) Take 1 tablet by mouth daily. 2 gummies by mouth daily     . docusate sodium (COLACE) 50 MG capsule Take 2 capsules (100 mg total) by mouth 2 (two) times daily. 10 capsule 0  . ELIQUIS 5 MG TABS tablet TAKE 1 TABLET BY MOUTH TWICE A DAY 180 tablet 1  . fenofibrate (TRICOR) 145 MG tablet TAKE 1 TABLET BY MOUTH EVERY DAY 90 tablet 3  . ferrous sulfate (FERROUSUL) 325 (65 FE) MG tablet Take 1 tablet (325 mg total) by mouth 2 (two) times daily with a meal. 180 tablet 3  . guaiFENesin-codeine 100-10 MG/5ML syrup Take 5 mLs by mouth every 6 (six) hours as needed for cough. 120 mL 0  . hydrALAZINE (APRESOLINE) 50 MG tablet Take 50 mg by mouth 3 (three) times daily.    . insulin NPH Human (NOVOLIN N) 100 UNIT/ML injection Inject 20-60 Units into the skin 2 (two) times daily before a meal. 60 units in the am 60 units at  lunch and 20 units for supper - on a sliding scale    . Insulin Pen Needle 32G X 4 MM MISC Use to inject insulin with insulin pen 100 each 4  . loratadine (CLARITIN) 10 MG tablet Take 10 mg by mouth daily.     . meclizine (ANTIVERT) 12.5 MG tablet Take 1 tablet (12.5 mg total) by mouth 3 (three) times daily as needed for dizziness. 30 tablet 5  . Multiple Vitamins-Minerals (MULTIVITAMIN GUMMIES ADULT PO) Taking two gummies by mouth daily    . NOVOLIN R RELION 100 UNIT/ML injection Inject 20-30 Units into the skin 3 (three) times daily before meals. Per sliding scale    . nystatin cream (MYCOSTATIN) Apply 1 application topically 2 (two) times daily. x7 days to sacral area  30 g 0  . pantoprazole (PROTONIX) 40 MG tablet Take 1 tablet (40 mg total) by mouth daily. 30 tablet 11  . Pitavastatin Calcium (LIVALO) 4 MG TABS Take by mouth daily.    Marland Kitchen PRALUENT 75 MG/ML SOAJ INJECT 75 MG INTO THE SKIN EVERY 14 (FOURTEEN) DAYS. 2 pen 6  . RELION INSULIN SYRINGE 31G X 15/64" 1 ML MISC Inject 1 Syringe into the skin 3 (three) times daily.    Marland Kitchen sulfamethoxazole-trimethoprim (BACTRIM) 400-80 MG tablet Take 1 tablet by mouth daily. (renally dosed) 7 tablet 0  . Tafamidis (VYNDAMAX) 61 MG CAPS Take 61 mg by mouth daily. 30 capsule 11  . torsemide (DEMADEX) 20 MG tablet Take 1 tablet (20 mg total) by mouth 2 (two) times daily. 60 tablet 3  . VASCEPA 1 g capsule TAKE 2 CAPSULES (2 G TOTAL) BY MOUTH 2 (TWO) TIMES A DAY. 120 capsule 6  . nutrition supplement, JUVEN, (JUVEN) PACK Take 1 packet by mouth 2 (two) times daily between meals. (Patient not taking: Reported on 07/02/2020) 60 packet 0   No current facility-administered medications for this encounter.    Allergies:   Amlodipine, Atorvastatin, Crestor [rosuvastatin calcium], Ezetimibe-simvastatin, Sitagliptin, Statins, Celebrex [celecoxib], and Levemir [insulin detemir]   Social History:  The patient  reports that she has never smoked. She has never used  smokeless tobacco. She reports previous alcohol use. She reports that she does not use drugs.   Family History:  The patient's family history includes Colitis in her mother; Colon polyps in her father; Diabetes in her daughter, father, maternal grandfather, and mother; Heart attack in her sister; Heart disease in her father; Heart failure in her father and mother; Hypertension in her mother; Kidney Stones in her mother; Skin cancer in her mother; Stroke in her maternal grandfather and mother.   ROS:  Please see the history of present illness.   All other systems are personally reviewed and negative.  Vitals:   07/02/20 1334  BP: (!) 208/96  Pulse: 87  SpO2: 97%   Wt Readings from Last 3 Encounters:  07/02/20 90.4 kg (199 lb 6.4 oz)  06/17/20 95.3 kg (210 lb)  06/10/20 94.2 kg (207 lb 9.6 oz)    Exam:   BP (!) 208/96   Pulse 87   Wt 90.4 kg (199 lb 6.4 oz)   SpO2 97%   BMI 32.18 kg/m  General:  Appears weak. Dyspneic when talking.  HEENT: normal Neck: supple. JVP 11-12 . Carotids 2+ bilat; no bruits. No lymphadenopathy or thryomegaly appreciated. Cor: PMI nondisplaced. Regular rate & rhythm. No rubs, gallops or murmurs. Lungs: clear Abdomen: soft, nontender, nondistended. No hepatosplenomegaly. No bruits or masses. Good bowel sounds. Extremities: no cyanosis, clubbing, rash, R and LLE 2-3+ edema. LLE with partinal thickness wound on anterior and posterior wound LLE erythema noted. L TMA Neuro: alert & orientedx3, cranial nerves grossly intact. moves all 4 extremities w/o difficulty. Affect flat   EKG:     Recent Labs: 02/27/2020: Magnesium 2.0 04/21/2020: TSH 1.210 06/03/2020: B Natriuretic Peptide 718.7 06/17/2020: ALT 28; BUN 36; Creatinine, Ser 2.79; Potassium 3.3; Sodium 140 06/29/2020: Hemoglobin 8.3 Repeated and verified X2.; Platelets 519.0  Personally reviewed   Wt Readings from Last 3 Encounters:  07/02/20 90.4 kg (199 lb 6.4 oz)  06/17/20 95.3 kg (210 lb)  06/10/20  94.2 kg (207 lb 9.6 oz)    ASSESSMENT AND PLAN:  1. Acute/Chronic diastolic CHF: TEE in 4/58 showed EF 55-60%, mild LVH, stable bioprosthetic  aortic valve s/p TAVR.PYP scan in 2/20 suggestive of transthyretin amyloidosis, genetic testing suggestive of wild type.   - Marked volume overload. Will need admit to diurese with 80 mg IV lasix twice a dya.  2. CKD stage 3b:  Creatinine baseline 2.6-2.8  3. CAD: Cath in 8/19 with moderate RCA and LCx disease, severe distal LAD disease, medical management.   - No chest pain.  - No ASA given apixaban use.  - Continue pitivastatin 4 mg daily and Praluent.    - Continue fenofibrate and vascepa.  4. Atrial fibrillation: Paroxysmal.  - Check EKG   - Continue Eliquis. - Continue amiodarone 200 mg daily.   - Check TSH, CMET   5. HTN: Uncontrolled but has not had meds today.    She is off Coreg with junctional bradycardia at 7/21 admission.  - She cannot take amlodipine due to peripheral edema formation.  - Hold off on ACEI/ARB with CKD.  -Continue hydralazine 50 mg three times a day. 6. PAD: S/p PTCA to right popliteal and right AT in 7/20.  Now with left distal SFA-TP trunk bypass and left TMA in 2/21.   - She has followup with vascular surgery at [facility]  7. 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  - She is on tafamidis, continue   8. 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   9. Enterococcal endocarditis: Involving mitral valve, 7/21 TEE showed possible old calcified vegetation.    10. Hyperlipidemia: Lipids good on 8/21 panel, TGs controlled now.  Continue current regimen.  11. Mitral valve disease: TEE in 7/21 with mild-moderate MR, mild MS.   12. GERD:  - Continue Protonix 40 mg daily.  13. LLE Cellulitis Completed 2 rounds or oral antibiotics. Will need IV antibiotics.  - Check ABI if possible. Not sure she will tolerate with  wound and edema.  14. DMII 04/2020 Hgb A1C 9.9  Use SSI. Check hgb A1C.   Consult Hospitalist to assist with cellulitis.  Admit with marked volume overload. Check CMET, CBC, BNP, TSH, HIV, Hgb A 1C. Check blood cultures.    Discussed with Dr Aundra Dubin Admit to progressive care.    Jeanmarie Hubert, NP  07/02/2020   [place] [address] [facility] [place] [address] [phone] (office) [phone] (fax)

## 2020-07-02 NOTE — Telephone Encounter (Signed)
Left message to please call back. °

## 2020-07-02 NOTE — Telephone Encounter (Signed)
-----   Message from Willia Craze, NP sent at 07/01/2020  8:37 PM EST ----- Beth, her hemoglobin is stable. Ask her to continue the iron. She should already be scheduled to see me in follow up soon.Thanks

## 2020-07-02 NOTE — Consult Note (Signed)
Medical Consultation   [name]  GGY:[id number]  DOB: 1951-01-18  DOA: 07/02/2020  PCP: [name], DO   Outpatient Specialists: Otelia Sergeant - cardiology   Requesting physician: Aundra Dubin - cardiology  Reason for consultation: Direct admission from CHF clinic.  Also with concern for LE cellulitis, failed outpatient antibiotic treatment with Keflex.   History of Present Illness: [name] is an 69 y.o. female with h/o DM; OSA; AS s/p TAVR; obesity s/p gastric sleeve; CAD; HTN; HLD; stage 3 CKD; chronic diastolic CHF; and afib on Eliquis who was admitted from CHF clinic for acute on chronic diastolic CHF.  She reports several weeks of LLE weeping ulcerations that have been treated with Keflex -> Doxy -> Bactrim without improvement.  She has h/o PVD and toe amputations on that leg.  She did apparently have ABIs recently.  In the meantime, she has become increasingly SOB and less mobile.  She was previously able to transfer and ambulate with a walker within her house, but she is not able to get up the bathroom at this time.  No fevers.    Review of Systems:  ROS As per HPI otherwise 10 point review of systems negative.    Past Medical History: Past Medical History:  Diagnosis Date  . Atrial fibrillation ([place])    a. maintaining sinus s/p DCCV, on Eliquis  . CAD (coronary artery disease)    a. Nonobstructive by cath 2006 - 25% LAD, 25-30% after 1st diag, distal 25% PDA, minor irreg LCx. b. Normal nuc 2011.  Marland Kitchen CHF (congestive heart failure) ([place])   . CKD (chronic kidney disease), stage III ([place])   . Cystocele   . Difficult intubation 09-07-2012   big neck trouble with intubation parotid surgery"takes little med to sedate"  . Esophageal stricture   . GERD (gastroesophageal reflux disease)   . Hiatal hernia    a. 07/2013: HH with small stricture holding up barium tablet (concides with patient's sx of food sticking).  . History of  kidney stones   . Hyperlipidemia   . Hypertension   . IBS (irritable bowel syndrome)   . Memory difficulty 11/26/2015  . Morbid obesity ([place])    a. hx of gastric bypass (sleeve gastrectomy 2015)  . Non-STEMI (non-ST elevated myocardial infarction) ([place]) 01/16/2020  . Osteoporosis   . 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  Marland Kitchen Pericardial effusion    a. Mod by echo 2012 at time of PNA. b. Echo 07/2012: small pericardial effusion vs fat.  . Pressure ulcer 04/16/2019   right foot  . S/P TAVR (transcatheter aortic valve replacement)   . Severe aortic stenosis   . Sleep apnea     "have mask; don't use it" (02/14/2018)  . TIA (transient ischemic attack) 10/2014; 06/2016   "some memory issues since; daughter says my speech is sometimes different" (02/14/2018)  . Type II diabetes mellitus ([place])     Past Surgical History: Past Surgical History:  Procedure Laterality Date  . BREATH TEK H PYLORI N/A 07/29/2013   Procedure: [place];  Surgeon: Shann Medal, MD;  Location: [facility];  Service: General;  Laterality: N/A;  . CARDIAC CATHETERIZATION  2006  . CARDIOVERSION N/A 02/16/2018   Procedure: CARDIOVERSION;  Surgeon: Pixie Casino, MD;  Location: [facility] ENDOSCOPY;  Service: Cardiovascular;  Laterality: N/A;  . CARDIOVERSION N/A 02/25/2020  Procedure: CARDIOVERSION;  Surgeon: Larey Dresser, MD;  Location: [facility] ENDOSCOPY;  Service: Cardiovascular;  Laterality: N/A;  . CARPAL TUNNEL RELEASE Left 2011  . CATARACT EXTRACTION W/ INTRAOCULAR LENS  IMPLANT, BILATERAL Bilateral 2003   left  . CHOLECYSTECTOMY OPEN  1982  . COLONOSCOPY N/A 07/14/2015   Procedure: COLONOSCOPY;  Surgeon: Ladene Artist, MD;  Location: [facility] ENDOSCOPY;  Service: Endoscopy;  Laterality: N/A;  . ESOPHAGOGASTRODUODENOSCOPY N/A 12/27/2013   Procedure: ESOPHAGOGASTRODUODENOSCOPY (EGD);  Surgeon: Shann Medal, MD;  Location: [facility];  Service: General;  Laterality: N/A;  .  ESOPHAGOGASTRODUODENOSCOPY (EGD) WITH ESOPHAGEAL DILATION    . INTRAOPERATIVE TRANSTHORACIC ECHOCARDIOGRAM  05/08/2018   Procedure: INTRAOPERATIVE TRANSTHORACIC ECHOCARDIOGRAM;  Surgeon: Burnell Blanks, MD;  Location: [place];  Service: Open Heart Surgery;;  . IR FLUORO GUIDE CV LINE RIGHT  04/25/2019  . IR REMOVAL TUN CV CATH W/O FL  06/20/2019  . IR US GUIDE VASC ACCESS RIGHT  04/25/2019  . LAPAROSCOPIC GASTRIC SLEEVE RESECTION N/A 01/20/2014   Procedure: LAPAROSCOPIC GASTRIC SLEEVE RESECTION;  Surgeon: Shann Medal, MD;  Location: [facility] ORS;  Service: General;  Laterality: N/A;  . PAROTIDECTOMY  09/07/2012   Procedure: PAROTIDECTOMY;  Surgeon: Melida Quitter, MD;  Location: [place];  Service: ENT;  Laterality: Left;  LEFT PAROTIDECTOMY  . RIGHT HEART CATH N/A 09/25/2018   Procedure: RIGHT HEART CATH;  Surgeon: Larey Dresser, MD;  Location: [facility] CV LAB;  Service: Cardiovascular;  Laterality: N/A;  . RIGHT/LEFT HEART CATH AND CORONARY ANGIOGRAPHY N/A 03/29/2018   Procedure: RIGHT/LEFT HEART CATH AND CORONARY ANGIOGRAPHY;  Surgeon: Burnell Blanks, MD;  Location: [place] CV LAB;  Service: Cardiovascular;  Laterality: N/A;  . TEE WITHOUT CARDIOVERSION N/A 02/16/2018   Procedure: TRANSESOPHAGEAL ECHOCARDIOGRAM (TEE);  Surgeon: Pixie Casino, MD;  Location: [place];  Service: Cardiovascular;  Laterality: N/A;  . TEE WITHOUT CARDIOVERSION N/A 06/05/2019   Procedure: TRANSESOPHAGEAL ECHOCARDIOGRAM (TEE);  Surgeon: Sanda Klein, MD;  Location: [place];  Service: Cardiovascular;  Laterality: N/A;  . TEE WITHOUT CARDIOVERSION N/A 04/24/2019   Procedure: TRANSESOPHAGEAL ECHOCARDIOGRAM (TEE);  Surgeon: Sanda Klein, MD;  Location: [place];  Service: Cardiovascular;  Laterality: N/A;  . TEE WITHOUT CARDIOVERSION N/A 02/25/2020   Procedure: TRANSESOPHAGEAL ECHOCARDIOGRAM (TEE);  Surgeon: Larey Dresser, MD;  Location: [facility] ENDOSCOPY;  Service: Cardiovascular;  Laterality:  N/A;  . TOE AMPUTATION Left 07/2017   great toe; [facility]   . TONSILLECTOMY  1968  . TRANSCATHETER AORTIC VALVE REPLACEMENT, TRANSFEMORAL  05/08/2018   Transcatheter Aortic Valve Replacement   . TRANSCATHETER AORTIC VALVE REPLACEMENT, TRANSFEMORAL N/A 05/08/2018   Procedure: TRANSCATHETER AORTIC VALVE REPLACEMENT, TRANSFEMORAL;  Surgeon: Burnell Blanks, MD;  Location: [place];  Service: Open Heart Surgery;  Laterality: N/A;  . TUBAL LIGATION  yrs ago  . UPPER GI ENDOSCOPY  01/20/2014   Procedure: UPPER GI ENDOSCOPY;  Surgeon: Shann Medal, MD;  Location: [facility] ORS;  Service: General;;     Allergies:   Allergies  Allergen Reactions  . Amlodipine Swelling    Ankle swelling  . Atorvastatin Other (See Comments)    Myalgias   . Crestor [Rosuvastatin Calcium] Other (See Comments)    Stiffness and back pain  . Ezetimibe-Simvastatin Other (See Comments)    Leg cramps  . Sitagliptin Other (See Comments)  . Statins Other (See Comments)    Myalgias  . Celebrex [Celecoxib] Rash  . Levemir [Insulin Detemir] Rash     Social History:  reports  that she has never smoked. She has never used smokeless tobacco. She reports previous alcohol use. She reports that she does not use drugs.   Family History: Family History  Problem Relation Age of Onset  . Heart failure Mother   . Hypertension Mother   . Skin cancer Mother   . Colitis Mother   . Diabetes Mother   . Kidney Stones Mother   . Stroke Mother   . Heart disease Father   . Colon polyps Father   . Diabetes Father   . Heart failure Father   . Heart attack Sister   . Stroke Maternal Grandfather   . Diabetes Maternal Grandfather   . Diabetes Daughter        prediabetes  . Colon cancer Neg Hx       Physical Exam: Vitals:   07/02/20 1518 07/02/20 1523  BP:  (!) 150/90  Pulse:  71  Resp:  18  Temp:  97.9 F (36.6 C)  TempSrc:  Oral  Weight: 90.8 kg     Constitutional: Alert and awake, oriented x3, not in any acute  distress. Eyes: EOMI, irises appear normal, anicteric sclera,  ENMT: external ears and nose appear normal, normal hearing, Lips appear normal Neck: neck appears normal, no masses, normal ROM CVS: S1-S2 clear, no murmur rubs or gallops, taut LE edema Respiratory:  clear to auscultation bilaterally, no wheezing, rales or rhonchi. Respiratory effort normal. No accessory muscle use.  Abdomen: soft nontender, nondistended Musculoskeletal: : no cyanosis, clubbing or edema noted bilaterally; s/p L TMA Neuro: Cranial nerves II-XII intact, strength, sensation, reflexes Psych: judgement and insight appear normal, stable mood and affect, mental status Skin: stage 1-2 sacral pressure wound without clear breakdown; LLE weeping ulcerations on anterior > posterior lower leg without significant surrounding erythema or clear borders  Posterior left leg:   Anterior left lower leg with clear serous weeping:      Data reviewed:  I have personally reviewed the recent labs and imaging studies  Pertinent Labs:   Pending   Inpatient Medications:   Scheduled Meds: . [START ON 07/03/2020] amiodarone  200 mg Oral Daily  . apixaban  5 mg Oral BID  . docusate sodium  100 mg Oral BID  . [START ON 07/03/2020] fenofibrate  160 mg Oral Daily  . ferrous sulfate  325 mg Oral BID WC  . furosemide  80 mg Intravenous BID  . hydrALAZINE  50 mg Oral TID  . icosapent Ethyl  2 g Oral BID  . [START ON 07/03/2020] pantoprazole  40 mg Oral Daily  . sodium chloride flush  3 mL Intravenous Q12H  . Tafamidis  61 mg Oral Daily   Continuous Infusions: . sodium chloride       Radiological Exams on Admission: No results found.  Impression/Recommendations Principal Problem:   Acute on chronic diastolic heart failure (HCC) Active Problems:   Hyperlipidemia associated with type 2 diabetes mellitus (HCC)   Benign essential HTN   Coronary artery disease, non-occlusive   CKD stage 3 due to type 2 diabetes mellitus  (HCC)   Cellulitis of left lower leg  Acute on chronic diastolic CHF -Patient was admitted by the CHF service for further evaluation and management -She appeared comfortable at rest at the time of my evaluation -On Tafamidis for amyloidosis  LLE stasis ulcerations -She has been treated with abx x 3 as an outpatient -Based on current evaluation, there does not appear to be active infection and so antibiotics are unlikely  to improve this  -Vanc was ordered by the CHF team and can likely be discontinued at this time -Blood cultures were also ordered by the CHF team -Wound care consultation is likely most beneficial for this patient at this time - and her sacral pressure wound can also be addressed -11/4 DVT US was negative; low suspicion for DVT currently -Dr. Aundra Dubin also ordered LE arterial duplex study and this is pending  HTN -Continue hydralazine  DM -Continue NPH -Cover with sensitive-scale SSI  Stage 4 CKD -Baseline creatinine appears to be in the 2.8 range -will check today and trend  HLD -Continue Tricor, Livalo, Vascepa  Afib -Rate control with Amiodarone -Continue Eliquis    Thank you for this consultation.  [facility] hospitalist team will follow the patient with you.   Time Spent: 50 minutes  Karmen Bongo M.D. [facility] 07/02/2020, 3:32 PM

## 2020-07-03 ENCOUNTER — Encounter (HOSPITAL_COMMUNITY): Payer: Self-pay | Admitting: Cardiology

## 2020-07-03 ENCOUNTER — Encounter (HOSPITAL_COMMUNITY): Payer: Medicare Other

## 2020-07-03 DIAGNOSIS — D500 Iron deficiency anemia secondary to blood loss (chronic): Secondary | ICD-10-CM

## 2020-07-03 DIAGNOSIS — R195 Other fecal abnormalities: Secondary | ICD-10-CM

## 2020-07-03 DIAGNOSIS — I10 Essential (primary) hypertension: Secondary | ICD-10-CM

## 2020-07-03 DIAGNOSIS — I5033 Acute on chronic diastolic (congestive) heart failure: Secondary | ICD-10-CM | POA: Diagnosis not present

## 2020-07-03 DIAGNOSIS — N179 Acute kidney failure, unspecified: Secondary | ICD-10-CM | POA: Diagnosis not present

## 2020-07-03 DIAGNOSIS — K219 Gastro-esophageal reflux disease without esophagitis: Secondary | ICD-10-CM

## 2020-07-03 DIAGNOSIS — L03116 Cellulitis of left lower limb: Secondary | ICD-10-CM | POA: Diagnosis not present

## 2020-07-03 DIAGNOSIS — N184 Chronic kidney disease, stage 4 (severe): Secondary | ICD-10-CM

## 2020-07-03 LAB — IRON AND TIBC
Iron: 78 ug/dL (ref 28–170)
Saturation Ratios: 17 % (ref 10.4–31.8)
TIBC: 468 ug/dL — ABNORMAL HIGH (ref 250–450)
UIBC: 390 ug/dL

## 2020-07-03 LAB — CBC
HCT: 26.1 % — ABNORMAL LOW (ref 36.0–46.0)
Hemoglobin: 7.4 g/dL — ABNORMAL LOW (ref 12.0–15.0)
MCH: 20.8 pg — ABNORMAL LOW (ref 26.0–34.0)
MCHC: 28.4 g/dL — ABNORMAL LOW (ref 30.0–36.0)
MCV: 73.3 fL — ABNORMAL LOW (ref 80.0–100.0)
Platelets: 414 10*3/uL — ABNORMAL HIGH (ref 150–400)
RBC: 3.56 MIL/uL — ABNORMAL LOW (ref 3.87–5.11)
RDW: 23.5 % — ABNORMAL HIGH (ref 11.5–15.5)
WBC: 8.3 10*3/uL (ref 4.0–10.5)
nRBC: 0 % (ref 0.0–0.2)

## 2020-07-03 LAB — GLUCOSE, CAPILLARY
Glucose-Capillary: 197 mg/dL — ABNORMAL HIGH (ref 70–99)
Glucose-Capillary: 88 mg/dL (ref 70–99)
Glucose-Capillary: 90 mg/dL (ref 70–99)
Glucose-Capillary: 92 mg/dL (ref 70–99)

## 2020-07-03 LAB — BASIC METABOLIC PANEL
Anion gap: 11 (ref 5–15)
BUN: 61 mg/dL — ABNORMAL HIGH (ref 8–23)
CO2: 28 mmol/L (ref 22–32)
Calcium: 8.4 mg/dL — ABNORMAL LOW (ref 8.9–10.3)
Chloride: 98 mmol/L (ref 98–111)
Creatinine, Ser: 3.49 mg/dL — ABNORMAL HIGH (ref 0.44–1.00)
GFR, Estimated: 14 mL/min — ABNORMAL LOW (ref >60)
Glucose, Bld: 185 mg/dL — ABNORMAL HIGH (ref 70–99)
Potassium: 3.8 mmol/L (ref 3.5–5.1)
Sodium: 137 mmol/L (ref 135–145)

## 2020-07-03 LAB — FOLATE: Folate: 11.8 ng/mL (ref >5.9)

## 2020-07-03 LAB — RETICULOCYTES
Immature Retic Fract: 27.6 % — ABNORMAL HIGH (ref 2.3–15.9)
RBC.: 3.79 MIL/uL — ABNORMAL LOW (ref 3.87–5.11)
Retic Count, Absolute: 147.1 10*3/uL (ref 19.0–186.0)
Retic Ct Pct: 3.9 % — ABNORMAL HIGH (ref 0.4–3.1)

## 2020-07-03 LAB — FERRITIN: Ferritin: 30 ng/mL (ref 11–307)

## 2020-07-03 LAB — VITAMIN B12: Vitamin B-12: 513 pg/mL (ref 180–914)

## 2020-07-03 MED ORDER — PANTOPRAZOLE SODIUM 40 MG PO TBEC
40.0000 mg | DELAYED_RELEASE_TABLET | Freq: Two times a day (BID) | ORAL | Status: DC
  Administered 2020-07-03 – 2020-07-13 (×20): 40 mg via ORAL
  Filled 2020-07-03 (×21): qty 1

## 2020-07-03 NOTE — Progress Notes (Signed)
Patient ID: [name], female   DOB: [birth year], 69 y.o.   MRN: [id number]  PROGRESS NOTE    [name]  TTS:[id number] DOB: [birth year] DOA: 07/02/2020 PCP: [name], DO   Brief Narrative:  69 y.o. female with h/o DM; OSA; AS s/p TAVR; obesity s/p gastric sleeve; CAD; HTN; HLD; stage 3 CKD; chronic diastolic CHF; and afib on Eliquis was admitted on 07/02/2020 from CHF clinic for acute on chronic diastolic CHF and possible left lower extremity cellulitis.  TRH was consulted for possible left lower extremity cellulitis, failed outpatient treatment with Keflex.  Assessment & Plan:   Left lower extremity stasis dermatitis with ulcerations with?  Cellulitis -She has been treated with antibiotics x3 as an outpatient.  Left lower extremity is slightly erythematous but unclear if this is a bacterial infection versus fluid overload from CHF.  She is currently being treated with IV vancomycin by the primary cardiology team. -Wound care consultation appreciated -Follow ABI.  Lower extremity duplex ultrasound on 06/18/2020 was negative for DVT  Acute on chronic diastolic CHF  hypertension Paroxysmal A. Fib Hyperlipidemia -Being primarily managed by heart failure team.  Lasix held today because of renal function.  Currently on hydralazine and amiodarone.  Also on tafamidis for amyloidosis.  Also on TriCor, Vascepa  Diabetes mellitus type II with hyperglycemia -Continue NPH along with CBGs with SSI.  AKI on CKD stage IV -Baseline creatinine around 2.8.  Creatinine 3.49 today.  Consider nephrology evaluation if creatinine continues to rise  Anemia of chronic disease -From CKD stage IV.  Hemoglobin 7.4.  Recommend transfusion if hemoglobin less than 7.  Obesity -Outpatient follow-up   Subjective: Patient seen and examined at bedside.  Denies any worsening shortness breath, chest pain, nausea or vomiting.  Objective: Vitals:   07/02/20 2221 07/02/20 2340 07/03/20 0343  07/03/20 0831  BP: (!) 128/54 133/66 (!) 133/58 (!) 146/114  Pulse:  81 87 72  Resp:  19 17 18   Temp:  98.5 F (36.9 C) 98.7 F (37.1 C) 98.6 F (37 C)  TempSrc:  Oral Oral Oral  SpO2:  97% 98%   Weight:   90.8 kg     Intake/Output Summary (Last 24 hours) at 07/03/2020 1032 Last data filed at 07/03/2020 0840 Gross per 24 hour  Intake 863 ml  Output 1151 ml  Net -288 ml   Filed Weights   07/02/20 1518 07/03/20 0343  Weight: 90.8 kg 90.8 kg    Examination:  General exam: Appears calm and comfortable  Respiratory system: Bilateral decreased breath sounds at bases with basilar crackles Cardiovascular system: S1 & S2 heard, Rate controlled Gastrointestinal system: Abdomen is obese, nondistended, soft and nontender. Normal bowel sounds heard. Extremities: No cyanosis, clubbing; bilateral lower extremity edema present with chronic changes and mildly erythematous left lower extremity with some weeping; left TMA present  Central nervous system: Alert and oriented. No focal neurological deficits. Moving extremities Skin: No obvious ecchymosis  psychiatry: Judgement and insight appear normal. Mood & affect appropriate.     Data Reviewed: I have personally reviewed following labs and imaging studies  CBC: Recent Labs  Lab 06/29/20 1106 07/02/20 1628 07/03/20 0248  WBC 9.5 7.2 8.3  NEUTROABS 7.8* 6.1  --   HGB 8.3 Repeated and verified X2.* 7.9* 7.4*  HCT 27.0* 27.6* 26.1*  MCV 70.0* 73.4* 73.3*  PLT 519.0* 476* 092*   Basic Metabolic Panel: Recent Labs  Lab 07/02/20 1628 07/03/20 0248  NA 136 137  K  3.9 3.8  CL 97* 98  CO2 27 28  GLUCOSE 373* 185*  BUN 58* 61*  CREATININE 3.54* 3.49*  CALCIUM 8.7* 8.4*  MG 2.2  --    GFR: Estimated Creatinine Clearance: 17.3 mL/min (A) (by C-G formula based on SCr of 3.49 mg/dL (H)). Liver Function Tests: Recent Labs  Lab 07/02/20 1628  AST 49*  ALT 36  ALKPHOS 45  BILITOT 1.1  PROT 6.3*  ALBUMIN 2.7*   No results  for input(s): LIPASE, AMYLASE in the last 168 hours. No results for input(s): AMMONIA in the last 168 hours. Coagulation Profile: No results for input(s): INR, PROTIME in the last 168 hours. Cardiac Enzymes: No results for input(s): CKTOTAL, CKMB, CKMBINDEX, TROPONINI in the last 168 hours. BNP (last 3 results) No results for input(s): PROBNP in the last 8760 hours. HbA1C: No results for input(s): HGBA1C in the last 72 hours. CBG: Recent Labs  Lab 07/02/20 2224 07/03/20 0808  GLUCAP 352* 197*   Lipid Profile: No results for input(s): CHOL, HDL, LDLCALC, TRIG, CHOLHDL, LDLDIRECT in the last 72 hours. Thyroid Function Tests: Recent Labs    07/02/20 1628  TSH 1.216   Anemia Panel: No results for input(s): VITAMINB12, FOLATE, FERRITIN, TIBC, IRON, RETICCTPCT in the last 72 hours. Sepsis Labs: No results for input(s): PROCALCITON, LATICACIDVEN in the last 168 hours.  Recent Results (from the past 240 hour(s))  Respiratory Panel by RT PCR (Flu A&B, Covid) - Nasopharyngeal Swab     Status: None   Collection Time: 07/02/20  4:37 PM   Specimen: Nasopharyngeal Swab; Nasopharyngeal(NP) swabs in vial transport medium  Result Value Ref Range Status   SARS Coronavirus 2 by RT PCR NEGATIVE NEGATIVE Final    Comment: (NOTE) SARS-CoV-2 target nucleic acids are NOT DETECTED.  The SARS-CoV-2 RNA is generally detectable in upper respiratoy specimens during the acute phase of infection. The lowest concentration of SARS-CoV-2 viral copies this assay can detect is 131 copies/mL. A negative result does not preclude SARS-Cov-2 infection and should not be used as the sole basis for treatment or other patient management decisions. A negative result may occur with  improper specimen collection/handling, submission of specimen other than nasopharyngeal swab, presence of viral mutation(s) within the areas targeted by this assay, and inadequate number of viral copies (<131 copies/mL). A negative  result must be combined with clinical observations, patient history, and epidemiological information. The expected result is Negative.  Fact Sheet for Patients:  PinkCheek.be  Fact Sheet for Healthcare Providers:  GravelBags.it  This test is no t yet approved or cleared by the [place] FDA and  has been authorized for detection and/or diagnosis of SARS-CoV-2 by FDA under an Emergency Use Authorization (EUA). This EUA will remain  in effect (meaning this test can be used) for the duration of the COVID-19 declaration under Section 564(b)(1) of the Act, 21 U.S.C. section 360bbb-3(b)(1), unless the authorization is terminated or revoked sooner.     Influenza A by PCR NEGATIVE NEGATIVE Final   Influenza B by PCR NEGATIVE NEGATIVE Final    Comment: (NOTE) The Xpert Xpress SARS-CoV-2/FLU/RSV assay is intended as an aid in  the diagnosis of influenza from Nasopharyngeal swab specimens and  should not be used as a sole basis for treatment. Nasal washings and  aspirates are unacceptable for Xpert Xpress SARS-CoV-2/FLU/RSV  testing.  Fact Sheet for Patients: PinkCheek.be  Fact Sheet for Healthcare Providers: GravelBags.it  This test is not yet approved or cleared by the [place]  FDA and  has been authorized for detection and/or diagnosis of SARS-CoV-2 by  FDA under an Emergency Use Authorization (EUA). This EUA will remain  in effect (meaning this test can be used) for the duration of the  Covid-19 declaration under Section 564(b)(1) of the Act, 21  U.S.C. section 360bbb-3(b)(1), unless the authorization is  terminated or revoked. Performed at [facility], [place] [address], [place], [place] [address]          Radiology Studies: DG Chest Port 1 View  Result Date: 07/02/2020 CLINICAL DATA:  Shortness of breath EXAM: PORTABLE CHEST 1 VIEW COMPARISON:   June 10, 2020 FINDINGS: No edema or airspace opacity. There is cardiomegaly with pulmonary vascularity normal. No adenopathy. No bone lesions. IMPRESSION: No edema or airspace opacity.  There is cardiomegaly. Electronically Signed   By: Lowella Grip III M.D.   On: 07/02/2020 17:27        Scheduled Meds: . amiodarone  200 mg Oral Daily  . docusate sodium  100 mg Oral BID  . fenofibrate  160 mg Oral Daily  . ferrous sulfate  325 mg Oral BID WC  . hydrALAZINE  50 mg Oral TID  . icosapent Ethyl  2 g Oral BID  . insulin aspart  0-5 Units Subcutaneous QHS  . insulin aspart  0-9 Units Subcutaneous TID WC  . insulin NPH Human  20 Units Subcutaneous Q supper  . insulin NPH Human  60 Units Subcutaneous BID WC  . pantoprazole  40 mg Oral Daily  . sodium chloride flush  3 mL Intravenous Q12H  . Tafamidis  61 mg Oral Daily   Continuous Infusions: . sodium chloride    . [START ON 07/04/2020] vancomycin            Aline August, MD [facility] 07/03/2020, 10:32 AM

## 2020-07-03 NOTE — Consult Note (Addendum)
[facility] Nurse Consult Note: Patient receiving care in [facility] Reason for Consult: LLE ulcers and sacral wound Wound type: Sacrum is red but blanchable MASD LLE has multiple ulcers Drainage (amount, consistency, odor) No dressings on the LLE at this time. The wounds are yellow and moist.  Periwound: LLE: Erythmatous, warm to touch Dressing procedure/placement/frequency:  Apply Aquacel Advantage Kellie Simmering # [phone]) to ulcers on LLE. Secure with Kerlix. Change daily or PRN soiling. Continue foam dressing to sacrum. Peel down foam dressing EACH shift. Record your observations. Change foam dressing every 3 days or PRN soiling. Apply skin barrier cream PRN.  Notify the physician team if the area worsens    Monitor the wound area(s) for worsening of condition such as: Signs/symptoms of infection, increase in size, development of or worsening of odor, development of pain, or increased pain at the affected locations.   Notify the medical team if any of these develop.  Thank you for the consult. [facility] nurse will not follow at this time.   Please re-consult the [facility] team if needed.  Cathlean Marseilles Tamala Julian, MSN, RN, [place], [facility], [facility] Wound Treatment Associate Pager [phone]

## 2020-07-03 NOTE — Consult Note (Signed)
   [facility] CM Inpatient Consult   07/03/2020  [name] September 20, 1950 [id number]   [facility] [ACO] Patient:  Medicare [employer]/[facility] Embedded [[facility]] Practice  Patient was screened for [facility] Management services. Patient will have the transition of care call conducted by the primary care provider. This patient is also in an Embedded practice which has a chronic disease management Embedded Care Management team.  Review of medical record reveals patient has been contacted for Chronic Care Management and will let the [facility] CCM team aware of admission.  Plan: Notification will be sent to the [facility] Management and made aware of above needs.   Please contact for further questions,  [name], RN BSN [facility] Liaison  [phone] business mobile phone Toll free office [phone]  Fax number: [phone] [email or web address]

## 2020-07-03 NOTE — Consult Note (Addendum)
[facility] Consult: 10:48 AM 07/03/2020  LOS: 1 day    Referring Provider: Dr Aundra Dubin  Primary Care Physician:  [name], DO Primary Gastroenterologist:  Dr. Fuller Plan  Reason for Consultation:  anemia   HPI: [name] is a 69 y.o. female.  PMH IDDM.  OSA.  CAD.  Aortic stenosis, status post TAVR.  CAD.  Amyloid cardiomyopathy.  Diastolic CHF.  A. Fib, s/p DCCV, on Eliquis.  CKD 3.  Obesity, status post gastric sleeve bariatric procedure.  PVD.  Status post left femoropopliteal bypass.  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 Klebsiella UTI in 02/2020.  No issues with liver or biliary tract on 04/15/2019 CTAP. No previous blood transfusion. FOBT positive stool on 04/30/2020  07/2013 upper GI series showed hiatal hernia so stated with small stricture resulting in delay of passage of tablet, reproducing patient's symptoms of food sticking.  Normal stomach and duodenum 06/2015 colonoscopy:  Average risk screening study.  Pedunculated polyp removed from sigmoid.  Otherwise normal study/  Path: Benign mucosal prolapse type polyp pathology.  Repeat colonoscopy due 06/2025 if health allows, at that point may have enough comorbidities and advancing age which would preclude screening colonoscopy.  GI office visit with NP Renaee Munda 06/08/2020 for evaluation of chronic microcytic anemia, Hemoccult positive stool.   Additional issues include chronic GERD despite PPI, solid food dysphagia.  Ideally she would undergo repeat EGD and possible repeat colonoscopy but due to recent RSV and persistent respiratory issues, no procedures were scheduled she was started on oral iron.  Plan was to revisit endoscopic procedures when respiratory status improved.  Ideally any procedures would be pursued off Eliquis.  Recently on  Keflex then doxycycline, then Bactrim for several weeks of weeping left lower extremity ulcers.  No improvement despite the courses of antibiotics.  No fevers. Developed increasing shortness of breath and her mobility has diminished.  She is able to mobilize using a walker normally but having trouble even getting up to go to the bathroom prior to arrival.  Started on IV vancomycin and IV Lasix. ABIs deferred because the patient's legs are tender and sensitive.  Hgb 8.3  >> 7.4.  On 11 3 it was 8.2.  Baseline 8 to 9. MCV consistently low since 02/2020.  No thrombocytopenia.   INR 1.8. Low iron, low iron saturation.  Ferritin normal at 25. Transaminases improved compared with 05/19/2020.  Patient's been swallowing okay but she describes bilious vomiting almost every morning but no vomiting for the rest of the day.  Despite PPI she suffers from a fair amount of heartburn.  This was particularly bad after she went to the dentist a couple of weeks ago and had to lay flat for a while.  Normally she either sleeps in a recliner or propped up with several pillows.  This morning she threw up dark looking coffee like material.  Has not seen blood in her emesis.  Moves her stools every 2 to 3 days, they are brown in color and she has not seen blood.  Some pain in her  right upper quadrant when she moves around, this radiates to her back.  After her gastric sleeve procedure her weight went from 250 to 200#, where wt has stableized.  No NSAIDS or ASA  Social history Lives with her husband.  No smoking or consumption of alcohol  Family history Her mom had colon polyps of unclear type.     Past Medical History:  Diagnosis Date  . Atrial fibrillation ([place])    a. maintaining sinus s/p DCCV, on Eliquis  . CAD (coronary artery disease)    a. Nonobstructive by cath 2006 - 25% LAD, 25-30% after 1st diag, distal 25% PDA, minor irreg LCx. b. Normal nuc 2011.  Marland Kitchen CHF (congestive heart failure) ([place])   . CKD (chronic  kidney disease), stage III ([place])   . Cystocele   . Difficult intubation 09-07-2012   big neck trouble with intubation parotid surgery"takes little med to sedate"  . Esophageal stricture   . GERD (gastroesophageal reflux disease)   . Hiatal hernia    a. 07/2013: HH with small stricture holding up barium tablet (concides with patient's sx of food sticking).  . History of kidney stones   . Hyperlipidemia   . Hypertension   . IBS (irritable bowel syndrome)   . Memory difficulty 11/26/2015  . Morbid obesity ([place])    a. hx of gastric bypass (sleeve gastrectomy 2015)  . Non-STEMI (non-ST elevated myocardial infarction) ([place]) 01/16/2020  . Osteoporosis   . 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  Marland Kitchen Pericardial effusion    a. Mod by echo 2012 at time of PNA. b. Echo 07/2012: small pericardial effusion vs fat.  . Pressure ulcer 04/16/2019   right foot  . S/P TAVR (transcatheter aortic valve replacement)   . Severe aortic stenosis   . Sleep apnea     "have mask; don't use it" (02/14/2018)  . TIA (transient ischemic attack) 10/2014; 06/2016   "some memory issues since; daughter says my speech is sometimes different" (02/14/2018)  . Type II diabetes mellitus ([place])     Past Surgical History:  Procedure Laterality Date  . BREATH TEK H PYLORI N/A 07/29/2013   Procedure: [place];  Surgeon: Shann Medal, MD;  Location: [facility];  Service: General;  Laterality: N/A;  . CARDIAC CATHETERIZATION  2006  . CARDIOVERSION N/A 02/16/2018   Procedure: CARDIOVERSION;  Surgeon: Pixie Casino, MD;  Location: [facility]COPY;  Service: Cardiovascular;  Laterality: N/A;  . CARDIOVERSION N/A 02/25/2020   Procedure: CARDIOVERSION;  Surgeon: Larey Dresser, MD;  Location: [place];  Service: Cardiovascular;  Laterality: N/A;  . CARPAL TUNNEL RELEASE Left 2011  . CATARACT EXTRACTION W/ INTRAOCULAR LENS  IMPLANT, BILATERAL Bilateral 2003   left  . CHOLECYSTECTOMY OPEN  1982  .  COLONOSCOPY N/A 07/14/2015   Procedure: COLONOSCOPY;  Surgeon: Ladene Artist, MD;  Location: [facility];  Service: Endoscopy;  Laterality: N/A;  . ESOPHAGOGASTRODUODENOSCOPY N/A 12/27/2013   Procedure: ESOPHAGOGASTRODUODENOSCOPY (EGD);  Surgeon: Shann Medal, MD;  Location: [facility];  Service: General;  Laterality: N/A;  . ESOPHAGOGASTRODUODENOSCOPY (EGD) WITH ESOPHAGEAL DILATION    . INTRAOPERATIVE TRANSTHORACIC ECHOCARDIOGRAM  05/08/2018   Procedure: INTRAOPERATIVE TRANSTHORACIC ECHOCARDIOGRAM;  Surgeon: Burnell Blanks, MD;  Location: [place];  Service: Open Heart Surgery;;  . IR FLUORO GUIDE CV LINE RIGHT  04/25/2019  . IR REMOVAL TUN CV CATH W/O FL  06/20/2019  . IR US GUIDE VASC ACCESS RIGHT  04/25/2019  .  LAPAROSCOPIC GASTRIC SLEEVE RESECTION N/A 01/20/2014   Procedure: LAPAROSCOPIC GASTRIC SLEEVE RESECTION;  Surgeon: Shann Medal, MD;  Location: [facility];  Service: General;  Laterality: N/A;  . PAROTIDECTOMY  09/07/2012   Procedure: PAROTIDECTOMY;  Surgeon: Melida Quitter, MD;  Location: [place];  Service: ENT;  Laterality: Left;  LEFT PAROTIDECTOMY  . RIGHT HEART CATH N/A 09/25/2018   Procedure: RIGHT HEART CATH;  Surgeon: Larey Dresser, MD;  Location: [facility];  Service: Cardiovascular;  Laterality: N/A;  . RIGHT/LEFT HEART CATH AND CORONARY ANGIOGRAPHY N/A 03/29/2018   Procedure: RIGHT/LEFT HEART CATH AND CORONARY ANGIOGRAPHY;  Surgeon: Burnell Blanks, MD;  Location: [place] [facility];  Service: Cardiovascular;  Laterality: N/A;  . TEE WITHOUT CARDIOVERSION N/A 02/16/2018   Procedure: TRANSESOPHAGEAL ECHOCARDIOGRAM (TEE);  Surgeon: Pixie Casino, MD;  Location: [place];  Service: Cardiovascular;  Laterality: N/A;  . TEE WITHOUT CARDIOVERSION N/A 06/05/2019   Procedure: TRANSESOPHAGEAL ECHOCARDIOGRAM (TEE);  Surgeon: Sanda Klein, MD;  Location: [place];  Service: Cardiovascular;  Laterality: N/A;  . TEE WITHOUT CARDIOVERSION N/A 04/24/2019    Procedure: TRANSESOPHAGEAL ECHOCARDIOGRAM (TEE);  Surgeon: Sanda Klein, MD;  Location: [place];  Service: Cardiovascular;  Laterality: N/A;  . TEE WITHOUT CARDIOVERSION N/A 02/25/2020   Procedure: TRANSESOPHAGEAL ECHOCARDIOGRAM (TEE);  Surgeon: Larey Dresser, MD;  Location: [facility] ENDOSCOPY;  Service: Cardiovascular;  Laterality: N/A;  . TOE AMPUTATION Left 07/2017   great toe; [facility]   . TONSILLECTOMY  1968  . TRANSCATHETER AORTIC VALVE REPLACEMENT, TRANSFEMORAL  05/08/2018   Transcatheter Aortic Valve Replacement   . TRANSCATHETER AORTIC VALVE REPLACEMENT, TRANSFEMORAL N/A 05/08/2018   Procedure: TRANSCATHETER AORTIC VALVE REPLACEMENT, TRANSFEMORAL;  Surgeon: Burnell Blanks, MD;  Location: [place];  Service: Open Heart Surgery;  Laterality: N/A;  . TUBAL LIGATION  yrs ago  . UPPER GI ENDOSCOPY  01/20/2014   Procedure: UPPER GI ENDOSCOPY;  Surgeon: Shann Medal, MD;  Location: [facility];  Service: General;;    Prior to Admission medications   Medication Sig Start Date End Date Taking? Authorizing Provider  ACCU-CHEK AVIVA PLUS test strip USE TO CHECK BLOOD SUGAR UP TO TWICE DAILY OR AS INSTRUCTED Patient taking differently: 1 each by Other route 2 (two) times daily.  09/04/17  Yes Chipper Herb, MD  amiodarone (PACERONE) 200 MG tablet Take 200 mg by mouth daily.   Yes [provider]  B-D INS SYR ULTRAFINE 1CC/31G 31G X 5/16" 1 ML MISC USE TO INJECT INSULIN TWICE A DAY AS INSTRUCTED Patient taking differently: 1 Stick by Other route 3 (three) times daily before meals.  11/16/15  Yes Chipper Herb, MD  benzonatate (TESSALON PERLES) 100 MG capsule Take 1 capsule (100 mg total) by mouth 3 (three) times daily as needed. 06/10/20  Yes Ronnie Doss M, DO  Calcium Carbonate (CALCIUM 600 PO) Take 1 tablet by mouth daily.   Yes [provider]  Cholecalciferol (VITAMIN D3 PO) Take 1 tablet by mouth daily.    Yes [provider]  Cyanocobalamin (VITAMIN B-12  PO) Take 1 tablet by mouth daily.    Yes [provider]  docusate sodium (COLACE) 50 MG capsule Take 2 capsules (100 mg total) by mouth 2 (two) times daily. Patient taking differently: Take 100 mg by mouth daily.  03/23/20  Yes Larey Dresser, MD  ELIQUIS 5 MG TABS tablet TAKE 1 TABLET BY MOUTH TWICE A DAY Patient taking differently: Take 5 mg by mouth 2 (two)  times daily.  02/03/20  Yes Minus Breeding, MD  fenofibrate (TRICOR) 145 MG tablet TAKE 1 TABLET BY MOUTH EVERY DAY Patient taking differently: Take 145 mg by mouth daily.  12/16/19  Yes Larey Dresser, MD  ferrous sulfate (FERROUSUL) 325 (65 FE) MG tablet Take 1 tablet (325 mg total) by mouth 2 (two) times daily with a meal. 06/10/20  Yes Cirigliano, Vito V, DO  hydrALAZINE (APRESOLINE) 50 MG tablet Take 50 mg by mouth 3 (three) times daily.   Yes [provider]  insulin NPH Human (NOVOLIN N) 100 UNIT/ML injection Inject 20-60 Units into the skin 2 (two) times daily before a meal. 60 units in the am 60 units at lunch and 20 units for supper - on a sliding scale   Yes [provider]  Insulin Pen Needle 32G X 4 MM MISC Use to inject insulin with insulin pen 05/17/16  Yes Chipper Herb, MD  loratadine (CLARITIN) 10 MG tablet Take 10 mg by mouth daily.    Yes [provider]  meclizine (ANTIVERT) 12.5 MG tablet Take 1 tablet (12.5 mg total) by mouth 3 (three) times daily as needed for dizziness. 06/14/19  Yes Larey Dresser, MD  Multiple Vitamins-Minerals (MULTIVITAMIN GUMMIES ADULT PO) Taking two gummies by mouth daily   Yes [provider]  NOVOLIN R RELION 100 UNIT/ML injection Inject 20-30 Units into the skin 3 (three) times daily before meals. Per sliding scale 02/07/20  Yes [provider]  nutrition supplement, JUVEN, (JUVEN) PACK Take 1 packet by mouth 2 (two) times daily between meals. 04/26/19  Yes Swayze, Ava, DO  pantoprazole (PROTONIX) 40 MG tablet Take 1 tablet (40 mg total)  by mouth daily. 05/19/20  Yes Larey Dresser, MD  Pitavastatin Calcium (LIVALO) 4 MG TABS Take 4 mg by mouth daily.    Yes [provider]  PRALUENT 75 MG/ML SOAJ INJECT 75 MG INTO THE SKIN EVERY 14 (FOURTEEN) DAYS. 11/18/19  Yes Larey Dresser, MD  RELION INSULIN SYRINGE 31G X 15/64" 1 ML MISC Inject 1 Syringe into the skin 3 (three) times daily. 01/04/20  Yes [provider]  sulfamethoxazole-trimethoprim (BACTRIM) 400-80 MG tablet Take 1 tablet by mouth daily. (renally dosed) 06/26/20  Yes Gottschalk, Ashly M, DO  Tafamidis (VYNDAMAX) 61 MG CAPS Take 61 mg by mouth daily. 07/29/19  Yes Larey Dresser, MD  torsemide (DEMADEX) 20 MG tablet Take 1 tablet (20 mg total) by mouth 2 (two) times daily. 05/27/20  Yes Larey Dresser, MD  VASCEPA 1 g capsule TAKE 2 CAPSULES (2 G TOTAL) BY MOUTH 2 (TWO) TIMES A DAY. Patient taking differently: Take 4 g by mouth 2 (two) times daily.  01/16/20  Yes Larey Dresser, MD  guaiFENesin-codeine 100-10 MG/5ML syrup Take 5 mLs by mouth every 6 (six) hours as needed for cough. Patient not taking: Reported on 07/03/2020 06/10/20   [name], DO  nystatin cream (MYCOSTATIN) Apply 1 application topically 2 (two) times daily. x7 days to sacral area Patient not taking: Reported on 07/03/2020 06/10/20   [name], DO  Olmesartan-Amlodipine-HCTZ (TRIBENZOR) 40-5-12.5 MG TABS Take 1 tablet by mouth daily.    04/12/18  [provider]    Scheduled Meds: . amiodarone  200 mg Oral Daily  . docusate sodium  100 mg Oral BID  . fenofibrate  160 mg Oral Daily  . ferrous sulfate  325 mg Oral BID WC  . hydrALAZINE  50 mg Oral  TID  . icosapent Ethyl  2 g Oral BID  . insulin aspart  0-5 Units Subcutaneous QHS  . insulin aspart  0-9 Units Subcutaneous TID WC  . insulin NPH Human  20 Units Subcutaneous Q supper  . insulin NPH Human  60 Units Subcutaneous BID WC  . pantoprazole  40 mg Oral Daily  . sodium chloride flush  3 mL  Intravenous Q12H  . Tafamidis  61 mg Oral Daily   Infusions: . sodium chloride    . [START ON 07/04/2020] vancomycin     PRN Meds: sodium chloride, acetaminophen, meclizine, ondansetron (ZOFRAN) IV, sodium chloride flush   Allergies as of 07/02/2020 - Review Complete 07/02/2020  Allergen Reaction Noted  . Amlodipine Swelling 08/22/2018  . Atorvastatin Other (See Comments) 02/09/2015  . Crestor [rosuvastatin calcium] Other (See Comments) 02/23/2015  . Ezetimibe-simvastatin Other (See Comments) 11/18/2010  . Sitagliptin Other (See Comments) 07/16/2019  . Statins Other (See Comments) 06/24/2016  . Celebrex [celecoxib] Rash 11/20/2012  . Levemir [insulin detemir] Rash 11/18/2010    Family History  Problem Relation Age of Onset  . Heart failure Mother   . Hypertension Mother   . Skin cancer Mother   . Colitis Mother   . Diabetes Mother   . Kidney Stones Mother   . Stroke Mother   . Heart disease Father   . Colon polyps Father   . Diabetes Father   . Heart failure Father   . Heart attack Sister   . Stroke Maternal Grandfather   . Diabetes Maternal Grandfather   . Diabetes Daughter        prediabetes  . Colon cancer Neg Hx     Social History   Socioeconomic History  . Marital status: Married    Spouse name: Not on file  . Number of children: 3  . Years of education: some college  . Highest education level: Some college, no degree  Occupational History  . Occupation: retired    Fish farm manager: [employer]  Tobacco Use  . Smoking status: Never Smoker  . Smokeless tobacco: Never Used  Vaping Use  . Vaping Use: Never used  Substance and Sexual Activity  . Alcohol use: Not Currently    Alcohol/week: 0.0 standard drinks    Comment:  "mixed drink q few years"  . Drug use: Never  . Sexual activity: Yes    Birth control/protection: Post-menopausal  Other Topics Concern  . Not on file  Social History Narrative   Married   Patient is right handed.   Patient rarely  drinks caffeine.   Lives at home with husband in two story home. Husband washes laundry in the basement. They have 3 daughters and 7 grandchildren.    Social Determinants of Health   Financial Resource Strain: Low Risk   . Difficulty of Paying Living Expenses: Not hard at all  Food Insecurity:   . Worried About Charity fundraiser in the Last Year: Not on file  . Ran Out of Food in the Last Year: Not on file  Transportation Needs: No Transportation Needs  . Lack of Transportation (Medical): No  . Lack of Transportation (Non-Medical): No  Physical Activity:   . Days of Exercise per Week: Not on file  . Minutes of Exercise per Session: Not on file  Stress:   . Feeling of Stress : Not on file  Social Connections:   . Frequency of Communication with Friends and Family: Not on file  . Frequency of Social Gatherings  with Friends and Family: Not on file  . Attends Religious Services: Not on file  . Active Member of Clubs or Organizations: Not on file  . Attends Archivist Meetings: Not on file  . Marital Status: Not on file  Intimate Partner Violence:   . Fear of Current or Ex-Partner: Not on file  . Emotionally Abused: Not on file  . Physically Abused: Not on file  . Sexually Abused: Not on file    REVIEW OF SYSTEMS: Constitutional: Weakness ENT:  No nose bleeds Pulm: Shortness of breath improved during hospitalization but still not back to a good baseline. CV:  No palpitations.  Improving LE edema.  No angina GU:  No hematuria, no frequency GI: See HPI Heme: Denies unusual or excessive bleeding or bruising. Transfusions: Patient recalls no prior transfusions and there is no records of blood product transfusions in the epic record Neuro:  No headaches, no peripheral tingling or numbness Derm: Her back itches her a lot, she is not aware of any rash however Endocrine:  No sweats or chills.  No polyuria or dysuria Immunization: Vaccines reviewed.  She received the Materna  vaccine in March/April 2021. Travel:  None   PHYSICAL EXAM: Vital signs in last 24 hours: Vitals:   07/03/20 0343 07/03/20 0831  BP: (!) 133/58 (!) 146/114  Pulse: 87 72  Resp: 17 18  Temp: 98.7 F (37.1 C) 98.6 F (37 C)  SpO2: 98%    Wt Readings from Last 3 Encounters:  07/03/20 90.8 kg  07/02/20 90.4 kg  06/17/20 95.3 kg    General: Pale, obese, chronically unwell appearing, comfortable, pleasant, alert Head: No facial asymmetry or swelling.   Eyes: No scleral icterus.  No conjunctival pallor.  EOMI Ears: Not hard of hearing Nose: Congestion or discharge Mouth: Oropharynx moist, pink, clear.  Tongue midline.  Fair dentition. Neck: No JVD, no masses, no thyromegaly appreciated. Lungs: A few crackles at the bases.  Diminished breath sounds.  No labored breathing at rest or cough. Heart: Current heart rate in the 80s with sinus rhythm and occasional PVCs on telemetry monitor. Abdomen: Obese, soft.  Nontender.  No HSM, masses, bruits, hernias.  Active bowel sounds..   Rectal: Deferred Musc/Skeltl: Left transmetatarsal amputation site well-healed.  Bandages cover legs from knees down bilaterally.  Lower legs are tender to minor pressure. Extremities: Lower extremity edema. Neurologic: Pleasant, cooperative.  Fully alert and oriented.  Moves all 4 limbs, strength not tested.  No tremors. Skin: No rash on her back.  No telangiectasia.  No visible sores Nodes: No cervical adenopathy Psych: Calm, pleasant, cooperative.  Intake/Output from previous day: 11/18 0701 - 11/19 0700 In: 51 [P.O.:620; I.V.:3] Out: 1151 [Urine:1151] Intake/Output this shift: Total I/O In: 240 [P.O.:240] Out: -   LAB RESULTS: Recent Labs    07/02/20 1628 07/03/20 0248  WBC 7.2 8.3  HGB 7.9* 7.4*  HCT 27.6* 26.1*  PLT 476* 414*   BMET Lab Results  Component Value Date   NA 137 07/03/2020   NA 136 07/02/2020   NA 140 06/17/2020   K 3.8 07/03/2020   K 3.9 07/02/2020   K 3.3 (L)  06/17/2020   CL 98 07/03/2020   CL 97 (L) 07/02/2020   CL 96 (L) 06/17/2020   CO2 28 07/03/2020   CO2 27 07/02/2020   CO2 32 06/17/2020   GLUCOSE 185 (H) 07/03/2020   GLUCOSE 373 (H) 07/02/2020   GLUCOSE 146 (H) 06/17/2020   BUN 61 (H)  07/03/2020   BUN 58 (H) 07/02/2020   BUN 36 (H) 06/17/2020   CREATININE 3.49 (H) 07/03/2020   CREATININE 3.54 (H) 07/02/2020   CREATININE 2.79 (H) 06/17/2020   CALCIUM 8.4 (L) 07/03/2020   CALCIUM 8.7 (L) 07/02/2020   CALCIUM 8.7 (L) 06/17/2020   LFT Recent Labs    07/02/20 1628  PROT 6.3*  ALBUMIN 2.7*  AST 49*  ALT 36  ALKPHOS 45  BILITOT 1.1   PT/INR Lab Results  Component Value Date   INR 1.8 (H) 02/19/2020   INR 1.3 (H) 12/09/2019   INR 1.00 05/08/2018   Hepatitis Panel No results for input(s): HEPBSAG, HCVAB, HEPAIGM, HEPBIGM in the last 72 hours. C-Diff No components found for: CDIFF Lipase     Component Value Date/Time   LIPASE 44 04/15/2019 2053    Drugs of Abuse     Component Value Date/Time   LABOPIA NONE DETECTED 11/07/2014 2241   COCAINSCRNUR NONE DETECTED 11/07/2014 2241   LABBENZ NONE DETECTED 11/07/2014 2241   AMPHETMU NONE DETECTED 11/07/2014 2241   THCU NONE DETECTED 11/07/2014 2241   LABBARB NONE DETECTED 11/07/2014 2241     RADIOLOGY STUDIES: DG Chest Port 1 View  Result Date: 07/02/2020 CLINICAL DATA:  Shortness of breath EXAM: PORTABLE CHEST 1 VIEW COMPARISON:  June 10, 2020 FINDINGS: No edema or airspace opacity. There is cardiomegaly with pulmonary vascularity normal. No adenopathy. No bone lesions. IMPRESSION: No edema or airspace opacity.  There is cardiomegaly. Electronically Signed   By: Lowella Grip III M.D.   On: 07/02/2020 17:27     IMPRESSION:   *   Microcytic anemia. Iron studies consistent with anemia of chronic disease and iron deficiency. Started po iron in 05/2020.    *   Chronic morning vomiting, may reflect GERD versus poor gastric emptying. Had an episode of coffee  like emesis this morning but this is the first of its kind.  *   Obesity.  Status post gastric sleeve procedure.  Lost and maintain weight loss of 50 #  *   IDDM  *    S/p TAVR  *   Acute diastolic CHF.    *   Chronic Eliquis.  Last dose 11/17, now on hold  *   LE cellulitis.  ? Role of ASPVD?    PLAN:     *   Increase Protonix to 40 mg bid.    *   Dr. Henrene Pastor to discuss the need for endoscopic procedures, if any.   Azucena Freed  07/03/2020, 10:48 AM Phone [phone]  GI ATTENDING  History, laboratories, x-rays, prior endoscopy reports reviewed.  Patient personally seen and examined.  Asked to see for anemia.  Evidence for iron deficiency and chronic disease.  Had been heme positive previously.  No acute or subacute bleeding.  Had unremarkable colonoscopy in 2016.  On chronic anticoagulation therapy.  Has had gastric sleeve surgery has active reflux symptoms with near daily vomiting.  Currently being treated for symptomatic congestive heart failure.  Doing better. RECOMMENDATIONS: 1.  Reflux precautions 2.  Increase pantoprazole to 40 mg TWICE daily (had been on once daily at home) 3.  Chronic iron therapy daily 4.  Outpatient monitoring of blood counts 5.  No indication for endoscopic procedures at present. We are available for questions or new problems.  Discussed impressions and recommendations with patient.  We will sign off.  Docia Chuck. Geri Seminole., M.D. [facility] Division of Gastroenterology

## 2020-07-03 NOTE — Progress Notes (Addendum)
Advanced Heart Failure Rounding Note  PCP-Cardiologist: Minus Breeding, MD   Subjective:    Admitted with a/c diastolic heart failure and LLE cellulitis. Started on IV lasix. Creatinine 3.5>3.5.   Receiving IV vancomycin. Blood cultures obtained. Left lower leg much less erythematous.   Complaining of LLE pain. Remains short of breath with exertion.    Objective:   Weight Range: 90.8 kg Body mass index is 32.31 kg/m.   Vital Signs:   Temp:  [97.9 F (36.6 C)-98.7 F (37.1 C)] 98.6 F (37 C) (11/19 0831) Pulse Rate:  [71-87] 72 (11/19 0831) Resp:  [17-20] 18 (11/19 0831) BP: (128-150)/(54-114) 146/114 (11/19 0831) SpO2:  [97 %-98 %] 98 % (11/19 0343) Weight:  [90.8 kg] 90.8 kg (11/19 0343) Last BM Date: 07/02/20  Weight change: Filed Weights   07/02/20 1518 07/03/20 0343  Weight: 90.8 kg 90.8 kg    Intake/Output:   Intake/Output Summary (Last 24 hours) at 07/03/2020 0834 Last data filed at 07/03/2020 0344 Gross per 24 hour  Intake 623 ml  Output 1151 ml  Net -528 ml      Physical Exam    General:   No resp difficulty HEENT: Normal Neck: Supple. JVP difficult to assess . Carotids 2+ bilat; no bruits. No lymphadenopathy or thyromegaly appreciated. Cor: PMI nondisplaced. Regular rate & rhythm. No rubs, gallops or murmurs. Lungs: Clear Abdomen: Soft, nontender, nondistended. No hepatosplenomegaly. No bruits or masses. Good bowel sounds. Extremities: No cyanosis, clubbing, rash, trace lower extremity edema. L TMA. LLE tender.  Neuro: Alert & orientedx3, cranial nerves grossly intact. moves all 4 extremities w/o difficulty. Affect pleasant   Telemetry   NSR  80-90s personally reviewed.   EKG    n/a  Labs    CBC Recent Labs    07/02/20 1628 07/03/20 0248  WBC 7.2 8.3  NEUTROABS 6.1  --   HGB 7.9* 7.4*  HCT 27.6* 26.1*  MCV 73.4* 73.3*  PLT 476* 478*   Basic Metabolic Panel Recent Labs    07/02/20 1628 07/03/20 0248  NA 136 137  K  3.9 3.8  CL 97* 98  CO2 27 28  GLUCOSE 373* 185*  BUN 58* 61*  CREATININE 3.54* 3.49*  CALCIUM 8.7* 8.4*  MG 2.2  --    Liver Function Tests Recent Labs    07/02/20 1628  AST 49*  ALT 36  ALKPHOS 45  BILITOT 1.1  PROT 6.3*  ALBUMIN 2.7*   No results for input(s): LIPASE, AMYLASE in the last 72 hours. Cardiac Enzymes No results for input(s): CKTOTAL, CKMB, CKMBINDEX, TROPONINI in the last 72 hours.  BNP: BNP (last 3 results) Recent Labs    02/21/20 1151 06/03/20 1354 07/02/20 1628  BNP 538.2* 718.7* 1,102.7*    ProBNP (last 3 results) No results for input(s): PROBNP in the last 8760 hours.   D-Dimer No results for input(s): DDIMER in the last 72 hours. Hemoglobin A1C No results for input(s): HGBA1C in the last 72 hours. Fasting Lipid Panel No results for input(s): CHOL, HDL, LDLCALC, TRIG, CHOLHDL, LDLDIRECT in the last 72 hours. Thyroid Function Tests Recent Labs    07/02/20 1628  TSH 1.216    Other results:   Imaging    DG Chest Port 1 View  Result Date: 07/02/2020 CLINICAL DATA:  Shortness of breath EXAM: PORTABLE CHEST 1 VIEW COMPARISON:  June 10, 2020 FINDINGS: No edema or airspace opacity. There is cardiomegaly with pulmonary vascularity normal. No adenopathy. No bone lesions. IMPRESSION: No  edema or airspace opacity.  There is cardiomegaly. Electronically Signed   By: Lowella Grip III M.D.   On: 07/02/2020 17:27      Medications:     Scheduled Medications: . amiodarone  200 mg Oral Daily  . apixaban  5 mg Oral BID  . docusate sodium  100 mg Oral BID  . fenofibrate  160 mg Oral Daily  . ferrous sulfate  325 mg Oral BID WC  . furosemide  80 mg Intravenous BID  . hydrALAZINE  50 mg Oral TID  . icosapent Ethyl  2 g Oral BID  . insulin aspart  0-5 Units Subcutaneous QHS  . insulin aspart  0-9 Units Subcutaneous TID WC  . insulin NPH Human  20 Units Subcutaneous Q supper  . insulin NPH Human  60 Units Subcutaneous BID WC  .  pantoprazole  40 mg Oral Daily  . sodium chloride flush  3 mL Intravenous Q12H  . Tafamidis  61 mg Oral Daily     Infusions: . sodium chloride    . 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 - Volume status improved. Stop IV lasix. Renal function trending up.  - Will need RHC next week.  2. CKD stage 3b:  Creatinine baseline 2.6-2.8 Creatinine on admit 3.5>3.5 today.  - Hold diuretics today. BMET in am.  3. CAD: Cath in 8/19 with moderate RCA and LCx disease, severe distal LAD disease, medical management. - No chest pain. - No ASA given apixaban use.  - Continue pitivastatin 4 mg daily and Praluent.  - Continue fenofibrate and vascepa.  4. Atrial fibrillation: Paroxysmal.  -NSR 80-90s  - Will need to hold Eliquis with Hgb down to 7.4.   - Continue amiodarone 200 mg daily. - TSH 1.2.  5. CXK:[id number] on admit.  She is off Coreg with junctional bradycardia at 7/21 admission.  - She cannot take amlodipine due to peripheral edema formation.  - Hold off on ACEI/ARB with CKD.  - Stable.  -Continue hydralazine 50 mg three times a day.  6. PAD: S/p PTCA to right popliteal and right AT in 7/20. Now with left distal SFA-TP trunk bypass and left TMA in 2/21.  - She has followup with vascular surgery at [facility].  - Ordered peripheral arterial dopplers with LE ulcerations to see if there is worsening PAD.  7. 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  - She is on tafamidis, continue.   8. S/p TAVR: Bioprosthetic aortic valve  stable on 7/21 TEE.  9. Enterococcal endocarditis: Involving mitral valve, 7/21 TEE showed possible old calcified vegetation.  10. Hyperlipidemia: Lipids good on 8/21 panel, TGs controlled now. Continue current regimen.  11. Mitral valve disease: TEE in 7/21 with mild-moderate MR, mild MS.  12. GERD: - ContinueProtonix 40 mg daily. 13. LLE Cellulitis - Completed 2 rounds or oral antibiotics.  - Receiving IV Vanc.  - Blood cultures obtained 07/02/20  - Check ABI if possible. Not sure she will tolerate with wound and edema.  14. DMII 04/2020 Hgb A1C 9.9  Use SSI. Check hgb A1C.  15. Anemia , Iron deficient - Followed by  Dr Fuller Plan Had planned EGD/Colonoscopy but this was postponed due to respiratory studies.  - Has not had oral iron in a week.  -Hgb trending down 7.9>7.4 . No obvious source. Hold eliquis today  - Consult GI.    Length of Stay: 1  Amy Clegg, NP  07/03/2020, 8:34 AM  Advanced Heart Failure Team Pager [phone] (M-F; 7a - 4p)  Please contact [facility] for night-coverage after hours (4p -7a ) and weekends on [email or web address]  Patient seen with NP, agree with the above note.   She got IV Lasix yesterday, I/Os mildly negative but she looks more comfortable and is no longer tachypneic.  Creatinine was 3.54 at admission (up from her baseline) and is 3.49 today.  She reports ongoing dyspnea with exertion.    Hgb trending down, 7.4.  She is on Eliquis and is in an accelerated junctional rhythm (has had in the past).  Had been working up Fe deficiency anemia as outpatient.   Started on vancomycin yesterday for ?cellulitis.  Reviewed IM note recommend stopping.  However, her LLE actually looks better today with less erythema.   General: NAD Neck: Thick JVP 8-9 cm, no thyromegaly or thyroid nodule.  Lungs: Clear to auscultation bilaterally with normal respiratory effort. CV: Nondisplaced PMI.  Heart regular S1/S2, no S3/S4, no murmur.  1+ edema 1/2 to knees  bilaterally.   Abdomen: Soft, nontender, no hepatosplenomegaly, no distention.  Skin: Improved erythema LLE.   Neurologic: Alert and oriented x 3.  Psych: Normal affect. Extremities: No clubbing or cyanosis. S/p left TMA.  HEENT: Normal.    Acute on chronic diastolic CHF, diuresed with IV Lasix yesterday.  I/Os not markedly negative but looks more comfortable today.  Creatinine is above baseline, 3.54 => 3.49.   - Will hold IV Lasix today, will need to start back on diuretics eventually, would probably start torsemide 40 mg daily tomorrow.  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    H/o Fe deficiency anemia, GI working up as outpatient but has not had scopes yet.  Hgb down to 7.4 here.  Has been FOBT positive.  - Will hold Eliquis.  - Consult GI, think she should get endoscopies while in inpatient setting as she is a very complicated patient.   Concern for LLE cellulitis, has ulcerations LLE.  Reviewed [facility] note from yesterday, recommended stopping abx.  However, her LLE does look much less erythematous today after getting IV vancomycin.  Wound care following.  - Would continue vancomycin unless [facility] disagrees (will defer).     - With ulcerations, will also check peripheral arterial dopplers to look for worsening of her baseline PAD.   She is in an accerated junctional rhythm (stable in 70s).  Has been seen in past.  Continue amiodarone for now.   [name] 07/03/2020 9:52 AM

## 2020-07-03 NOTE — Addendum Note (Signed)
Encounter addended by: Blenda Nicely, [facility] on: 07/03/2020 11:49 AM  Actions taken: Flowsheet accepted

## 2020-07-03 NOTE — Telephone Encounter (Signed)
Completed application for Vyndamax was obtained.   Will send via fax on December 1st.

## 2020-07-03 NOTE — Progress Notes (Signed)
ABI order received- per RN patient's legs are still very tender and sensitive. Due to the nature of this exam, we will attempt again when her legs are feeling better.  07/03/2020 10:14 AM Kelby Aline., MHA, RVT, RDCS, RDMS

## 2020-07-03 NOTE — Plan of Care (Signed)
  Problem: Education: Goal: Knowledge of General Education information will improve Description: Including pain rating scale, medication(s)/side effects and non-pharmacologic comfort measures Outcome: Progressing   Problem: Nutrition: Goal: Adequate nutrition will be maintained Outcome: Progressing   Problem: Safety: Goal: Ability to remain free from injury will improve Outcome: Progressing   

## 2020-07-04 DIAGNOSIS — I5033 Acute on chronic diastolic (congestive) heart failure: Secondary | ICD-10-CM | POA: Diagnosis not present

## 2020-07-04 DIAGNOSIS — L03116 Cellulitis of left lower limb: Secondary | ICD-10-CM | POA: Diagnosis not present

## 2020-07-04 DIAGNOSIS — N179 Acute kidney failure, unspecified: Secondary | ICD-10-CM | POA: Diagnosis not present

## 2020-07-04 DIAGNOSIS — N184 Chronic kidney disease, stage 4 (severe): Secondary | ICD-10-CM | POA: Diagnosis not present

## 2020-07-04 LAB — GLUCOSE, CAPILLARY
Glucose-Capillary: 156 mg/dL — ABNORMAL HIGH (ref 70–99)
Glucose-Capillary: 206 mg/dL — ABNORMAL HIGH (ref 70–99)
Glucose-Capillary: 116 mg/dL — ABNORMAL HIGH (ref 70–99)
Glucose-Capillary: 30 mg/dL — CL (ref 70–99)
Glucose-Capillary: 108 mg/dL — ABNORMAL HIGH (ref 70–99)
Glucose-Capillary: 86 mg/dL (ref 70–99)
Glucose-Capillary: 106 mg/dL — ABNORMAL HIGH (ref 70–99)
Glucose-Capillary: 70 mg/dL (ref 70–99)
Glucose-Capillary: 138 mg/dL — ABNORMAL HIGH (ref 70–99)

## 2020-07-04 LAB — BASIC METABOLIC PANEL
Anion gap: 11 (ref 5–15)
BUN: 55 mg/dL — ABNORMAL HIGH (ref 8–23)
CO2: 28 mmol/L (ref 22–32)
Calcium: 8.5 mg/dL — ABNORMAL LOW (ref 8.9–10.3)
Chloride: 98 mmol/L (ref 98–111)
Creatinine, Ser: 3.03 mg/dL — ABNORMAL HIGH (ref 0.44–1.00)
GFR, Estimated: 16 mL/min — ABNORMAL LOW (ref >60)
Glucose, Bld: 84 mg/dL (ref 70–99)
Potassium: 3.7 mmol/L (ref 3.5–5.1)
Sodium: 137 mmol/L (ref 135–145)

## 2020-07-04 LAB — CBC
HCT: 27.2 % — ABNORMAL LOW (ref 36.0–46.0)
Hemoglobin: 7.7 g/dL — ABNORMAL LOW (ref 12.0–15.0)
MCH: 20.6 pg — ABNORMAL LOW (ref 26.0–34.0)
MCHC: 28.3 g/dL — ABNORMAL LOW (ref 30.0–36.0)
MCV: 72.9 fL — ABNORMAL LOW (ref 80.0–100.0)
Platelets: 409 10*3/uL — ABNORMAL HIGH (ref 150–400)
RBC: 3.73 MIL/uL — ABNORMAL LOW (ref 3.87–5.11)
RDW: 23.4 % — ABNORMAL HIGH (ref 11.5–15.5)
WBC: 8.4 10*3/uL (ref 4.0–10.5)
nRBC: 0 % (ref 0.0–0.2)

## 2020-07-04 MED ORDER — DEXTROSE 50 % IV SOLN
INTRAVENOUS | Status: AC
  Administered 2020-07-04: 50 mL
  Filled 2020-07-04: qty 50

## 2020-07-04 MED ORDER — TORSEMIDE 20 MG PO TABS
40.0000 mg | ORAL_TABLET | Freq: Every day | ORAL | Status: DC
  Administered 2020-07-04 – 2020-07-05 (×2): 40 mg via ORAL
  Filled 2020-07-04 (×2): qty 2

## 2020-07-04 MED ORDER — ENOXAPARIN SODIUM 40 MG/0.4ML ~~LOC~~ SOLN
40.0000 mg | SUBCUTANEOUS | Status: DC
  Administered 2020-07-04: 40 mg via SUBCUTANEOUS
  Filled 2020-07-04: qty 0.4

## 2020-07-04 NOTE — Progress Notes (Signed)
Patient ID: [name], female   DOB: 1951-04-11, 69 y.o.   MRN: [id number]  PROGRESS NOTE    [name]  PJA:[id number] DOB: [birth year] DOA: 07/02/2020 PCP: [name], DO   Brief Narrative:  69 y.o. female with h/o DM; OSA; AS s/p TAVR; obesity s/p gastric sleeve; CAD; HTN; HLD; stage 3 CKD; chronic diastolic CHF; and afib on Eliquis was admitted on 07/02/2020 from CHF clinic for acute on chronic diastolic CHF and possible left lower extremity cellulitis.  TRH was consulted for possible left lower extremity cellulitis, failed outpatient treatment with Keflex.  Assessment & Plan:   Left lower extremity stasis dermatitis with ulcerations with?  Cellulitis -She has been treated with antibiotics x3 as an outpatient.  Left lower extremity is slightly erythematous but unclear if this is a bacterial infection versus fluid overload from CHF.  She is currently being treated with IV vancomycin by the primary cardiology team. -Wound care consultation appreciated -ABI pending.  Lower extremity duplex ultrasound on 06/18/2020 was negative for DVT  Acute on chronic diastolic CHF  hypertension Paroxysmal A. Fib Hyperlipidemia -Being primarily managed by heart failure team.  Lasix held today because of renal function.  Currently on hydralazine and amiodarone.  Also on tafamidis for amyloidosis.  Also on TriCor, Vascepa  Diabetes mellitus type II with hyperglycemia -Continue NPH along with CBGs with SSI.  Blood sugars slightly on the lower side.  AKI on CKD stage IV -Baseline creatinine around 2.8.  Creatinine 3.03 today.  Consider nephrology evaluation if creatinine continues to rise  Anemia of chronic disease -From CKD stage IV.  Hemoglobin 7.7.  Recommend transfusion if hemoglobin less than 7.  GI recommends outpatient GI follow-up and to continue Protonix twice a day and chronic iron therapy daily.  Obesity -Outpatient follow-up   Subjective: Patient seen and examined at  bedside.  No overnight fever, vomiting, chest pain reported.  Feels that her lower extremity swelling is improving and pain is improving. Objective: Vitals:   07/03/20 1620 07/03/20 2033 07/03/20 2102 07/04/20 0509  BP: 133/63 (!) 125/48 125/75 133/68  Pulse: 72 86  94  Resp: 18 18  20   Temp: 97.8 F (36.6 C) 97.8 F (36.6 C)  97.6 F (36.4 C)  TempSrc: Oral Oral  Oral  SpO2:    96%  Weight:    89.6 kg  Height:        Intake/Output Summary (Last 24 hours) at 07/04/2020 0809 Last data filed at 07/03/2020 2103 Gross per 24 hour  Intake 1083 ml  Output 650 ml  Net 433 ml   Filed Weights   07/03/20 0343 07/03/20 1553 07/04/20 0509  Weight: 90.8 kg 90.8 kg 89.6 kg    Examination:  General exam: No distress.   Respiratory system: Decreased breath sounds at bases bilaterally with some scattered crackles  cardiovascular system: Rate controlled with respiratory Gastrointestinal system: Abdomen is obese, nondistended, soft and nontender.  Bowel sounds are heard Extremities: No clubbing.  Lower extremity edema present with chronic changes dressing present; left TMA present   Data Reviewed: I have personally reviewed following labs and imaging studies  CBC: Recent Labs  Lab 06/29/20 1106 07/02/20 1628 07/03/20 0248 07/04/20 0258  WBC 9.5 7.2 8.3 8.4  NEUTROABS 7.8* 6.1  --   --   HGB 8.3 Repeated and verified X2.* 7.9* 7.4* 7.7*  HCT 27.0* 27.6* 26.1* 27.2*  MCV 70.0* 73.4* 73.3* 72.9*  PLT 519.0* 476* 414* 341*   Basic Metabolic  Panel: Recent Labs  Lab 07/02/20 1628 07/03/20 0248 07/04/20 0258  NA 136 137 137  K 3.9 3.8 3.7  CL 97* 98 98  CO2 27 28 28   GLUCOSE 373* 185* 84  BUN 58* 61* 55*  CREATININE 3.54* 3.49* 3.03*  CALCIUM 8.7* 8.4* 8.5*  MG 2.2  --   --    GFR: Estimated Creatinine Clearance: 19.8 mL/min (A) (by C-G formula based on SCr of 3.03 mg/dL (H)). Liver Function Tests: Recent Labs  Lab 07/02/20 1628  AST 49*  ALT 36  ALKPHOS 45    BILITOT 1.1  PROT 6.3*  ALBUMIN 2.7*   No results for input(s): LIPASE, AMYLASE in the last 168 hours. No results for input(s): AMMONIA in the last 168 hours. Coagulation Profile: No results for input(s): INR, PROTIME in the last 168 hours. Cardiac Enzymes: No results for input(s): CKTOTAL, CKMB, CKMBINDEX, TROPONINI in the last 168 hours. BNP (last 3 results) No results for input(s): PROBNP in the last 8760 hours. HbA1C: No results for input(s): HGBA1C in the last 72 hours. CBG: Recent Labs  Lab 07/02/20 2224 07/03/20 0808 07/03/20 1203 07/03/20 1617 07/03/20 2030  GLUCAP 352* 197* 88 90 92   Lipid Profile: No results for input(s): CHOL, HDL, LDLCALC, TRIG, CHOLHDL, LDLDIRECT in the last 72 hours. Thyroid Function Tests: Recent Labs    07/02/20 1628  TSH 1.216   Anemia Panel: Recent Labs    07/03/20 1030  VITAMINB12 513  FOLATE 11.8  FERRITIN 30  TIBC 468*  IRON 78  RETICCTPCT 3.9*   Sepsis Labs: No results for input(s): PROCALCITON, LATICACIDVEN in the last 168 hours.  Recent Results (from the past 240 hour(s))  Culture, blood (routine x 2)     Status: None (Preliminary result)   Collection Time: 07/02/20  4:28 PM   Specimen: BLOOD  Result Value Ref Range Status   Specimen Description BLOOD RIGHT ANTECUBITAL  Final   Special Requests   Final    BOTTLES DRAWN AEROBIC AND ANAEROBIC Blood Culture adequate volume   Culture   Final    NO GROWTH < 24 HOURS Performed at [facility], [address], [place], [place] [address]    Report Status PENDING  Incomplete  Culture, blood (routine x 2)     Status: None (Preliminary result)   Collection Time: 07/02/20  4:31 PM   Specimen: BLOOD  Result Value Ref Range Status   Specimen Description BLOOD LEFT ANTECUBITAL  Final   Special Requests   Final    BOTTLES DRAWN AEROBIC AND ANAEROBIC Blood Culture adequate volume   Culture   Final    NO GROWTH < 24 HOURS Performed at [facility], [place]  [address] [place], [place] [address]    Report Status PENDING  Incomplete  Respiratory Panel by RT PCR (Flu A&B, Covid) - Nasopharyngeal Swab     Status: None   Collection Time: 07/02/20  4:37 PM   Specimen: Nasopharyngeal Swab; Nasopharyngeal(NP) swabs in vial transport medium  Result Value Ref Range Status   SARS Coronavirus 2 by RT PCR NEGATIVE NEGATIVE Final    Comment: (NOTE) SARS-CoV-2 target nucleic acids are NOT DETECTED.  The SARS-CoV-2 RNA is generally detectable in upper respiratoy specimens during the acute phase of infection. The lowest concentration of SARS-CoV-2 viral copies this assay can detect is 131 copies/mL. A negative result does not preclude SARS-Cov-2 infection and should not be used as the sole basis for treatment or other patient  management decisions. A negative result may occur with  improper specimen collection/handling, submission of specimen other than nasopharyngeal swab, presence of viral mutation(s) within the areas targeted by this assay, and inadequate number of viral copies (<131 copies/mL). A negative result must be combined with clinical observations, patient history, and epidemiological information. The expected result is Negative.  Fact Sheet for Patients:  PinkCheek.be  Fact Sheet for Healthcare Providers:  GravelBags.it  This test is no t yet approved or cleared by the [place] FDA and  has been authorized for detection and/or diagnosis of SARS-CoV-2 by FDA under an Emergency Use Authorization (EUA). This EUA will remain  in effect (meaning this test can be used) for the duration of the COVID-19 declaration under Section 564(b)(1) of the Act, 21 U.S.C. section 360bbb-3(b)(1), unless the authorization is terminated or revoked sooner.     Influenza A by PCR NEGATIVE NEGATIVE Final   Influenza B by PCR NEGATIVE NEGATIVE Final    Comment: (NOTE) The Xpert Xpress SARS-CoV-2/FLU/RSV  assay is intended as an aid in  the diagnosis of influenza from Nasopharyngeal swab specimens and  should not be used as a sole basis for treatment. Nasal washings and  aspirates are unacceptable for Xpert Xpress SARS-CoV-2/FLU/RSV  testing.  Fact Sheet for Patients: PinkCheek.be  Fact Sheet for Healthcare Providers: GravelBags.it  This test is not yet approved or cleared by the [place] FDA and  has been authorized for detection and/or diagnosis of SARS-CoV-2 by  FDA under an Emergency Use Authorization (EUA). This EUA will remain  in effect (meaning this test can be used) for the duration of the  Covid-19 declaration under Section 564(b)(1) of the Act, 21  U.S.C. section 360bbb-3(b)(1), unless the authorization is  terminated or revoked. Performed at [facility], [place] [address], [place], [place] [address]          Radiology Studies: DG Chest Port 1 View  Result Date: 07/02/2020 CLINICAL DATA:  Shortness of breath EXAM: PORTABLE CHEST 1 VIEW COMPARISON:  June 10, 2020 FINDINGS: No edema or airspace opacity. There is cardiomegaly with pulmonary vascularity normal. No adenopathy. No bone lesions. IMPRESSION: No edema or airspace opacity.  There is cardiomegaly. Electronically Signed   By: Lowella Grip III M.D.   On: 07/02/2020 17:27        Scheduled Meds: . amiodarone  200 mg Oral Daily  . docusate sodium  100 mg Oral BID  . fenofibrate  160 mg Oral Daily  . ferrous sulfate  325 mg Oral BID WC  . hydrALAZINE  50 mg Oral TID  . icosapent Ethyl  2 g Oral BID  . insulin aspart  0-5 Units Subcutaneous QHS  . insulin aspart  0-9 Units Subcutaneous TID WC  . insulin NPH Human  20 Units Subcutaneous Q supper  . insulin NPH Human  60 Units Subcutaneous BID WC  . pantoprazole  40 mg Oral BID  . sodium chloride flush  3 mL Intravenous Q12H  . Tafamidis  61 mg Oral Daily   Continuous Infusions: .  sodium chloride    . vancomycin            Aline August, MD [facility] 07/04/2020, 8:09 AM

## 2020-07-04 NOTE — Progress Notes (Signed)
Call received about patient's CBG being 30.  As below, she received 1 amp of D50 with better response and repeat CBG at 116.  Instructed RN to hold NPH for now.  Continue to check CBG AC at bedtime or more if needed to ensure no further BG drops.   Kathyrn Drown NP-C [facility] Pager: [phone]

## 2020-07-04 NOTE — Progress Notes (Addendum)
Hypoglycemic Event  CBG: 30  Treatment: 1 amp of D50. When patient became more awake, gave regular Coke, graham crackers, and applesauce.  Symptoms: lethargic, slurred speech, diaphoretic, stated that she "felt faint"   Follow-up CBG: Time: 1605 CBG Result: 116  Possible Reasons for Event: Patient states CBGs have been labile at home. Patient takes 60 units NPH at breakfast and lunch and 20 units NPH at dinner in addition to SSI.   Comments/MD notified: Sent text page to [facility] NP    Janey Genta

## 2020-07-04 NOTE — Progress Notes (Signed)
Advanced Heart Failure Rounding Note  PCP-Cardiologist: Minus Breeding, MD   Subjective:    Admitted with a/c diastolic heart failure and LLE cellulitis. Started on IV lasix. Lasix held yesterday due to creatinine bump. Creatinine back down 3.5 -> 3.0. Weight down 3 pounds  Continue with multiple complaints of leg pain, SOB, swelling.   Objective:   Weight Range: 89.6 kg Body mass index is 31.89 kg/m.   Vital Signs:   Temp:  [97.6 F (36.4 C)-98.4 F (36.9 C)] 97.6 F (36.4 C) (11/20 0509) Pulse Rate:  [70-94] 94 (11/20 0509) Resp:  [18-20] 20 (11/20 0509) BP: (125-138)/(48-75) 133/68 (11/20 0509) SpO2:  [96 %] 96 % (11/20 0509) Weight:  [89.6 kg-90.8 kg] 89.6 kg (11/20 0509) Last BM Date: 07/02/20  Weight change: Filed Weights   07/03/20 0343 07/03/20 1553 07/04/20 0509  Weight: 90.8 kg 90.8 kg 89.6 kg    Intake/Output:   Intake/Output Summary (Last 24 hours) at 07/04/2020 1146 Last data filed at 07/03/2020 2103 Gross per 24 hour  Intake 843 ml  Output 300 ml  Net 543 ml      Physical Exam    General: Obese woman lying in bed No resp difficulty HEENT: normal Neck: supple. JVP 8 Carotids 2+ bilat; no bruits. No lymphadenopathy or thryomegaly appreciated. Cor: PMI nondisplaced. Regular rate & rhythm. No rubs, gallops or murmurs. Lungs: clear Abdomen: obese soft, nontender, nondistended. No hepatosplenomegaly. No bruits or masses. Good bowel sounds. Extremities: no cyanosis, clubbing, rash,1+ edema small healing ulcer back of left calf. Erythem resolved Neuro: alert & orientedx3, cranial nerves grossly intact. moves all 4 extremities w/o difficulty. Affect pleasant   Telemetry   NSR  90s Personally reviewed  Labs    CBC Recent Labs    07/02/20 1628 07/02/20 1628 07/03/20 0248 07/04/20 0258  WBC 7.2   < > 8.3 8.4  NEUTROABS 6.1  --   --   --   HGB 7.9*   < > 7.4* 7.7*  HCT 27.6*   < > 26.1* 27.2*  MCV 73.4*   < > 73.3* 72.9*  PLT 476*   <  > 414* 409*   < > = values in this interval not displayed.   Basic Metabolic Panel Recent Labs    07/02/20 1628 07/02/20 1628 07/03/20 0248 07/04/20 0258  NA 136   < > 137 137  K 3.9   < > 3.8 3.7  CL 97*   < > 98 98  CO2 27   < > 28 28  GLUCOSE 373*   < > 185* 84  BUN 58*   < > 61* 55*  CREATININE 3.54*   < > 3.49* 3.03*  CALCIUM 8.7*   < > 8.4* 8.5*  MG 2.2  --   --   --    < > = values in this interval not displayed.   Liver Function Tests Recent Labs    07/02/20 1628  AST 49*  ALT 36  ALKPHOS 45  BILITOT 1.1  PROT 6.3*  ALBUMIN 2.7*   No results for input(s): LIPASE, AMYLASE in the last 72 hours. Cardiac Enzymes No results for input(s): CKTOTAL, CKMB, CKMBINDEX, TROPONINI in the last 72 hours.  BNP: BNP (last 3 results) Recent Labs    02/21/20 1151 06/03/20 1354 07/02/20 1628  BNP 538.2* 718.7* 1,102.7*    ProBNP (last 3 results) No results for input(s): PROBNP in the last 8760 hours.   D-Dimer No results for input(s): DDIMER  in the last 72 hours. Hemoglobin A1C No results for input(s): HGBA1C in the last 72 hours. Fasting Lipid Panel No results for input(s): CHOL, HDL, LDLCALC, TRIG, CHOLHDL, LDLDIRECT in the last 72 hours. Thyroid Function Tests Recent Labs    07/02/20 1628  TSH 1.216    Other results:   Imaging    No results found.   Medications:     Scheduled Medications: . amiodarone  200 mg Oral Daily  . docusate sodium  100 mg Oral BID  . fenofibrate  160 mg Oral Daily  . ferrous sulfate  325 mg Oral BID WC  . hydrALAZINE  50 mg Oral TID  . icosapent Ethyl  2 g Oral BID  . insulin aspart  0-5 Units Subcutaneous QHS  . insulin aspart  0-9 Units Subcutaneous TID WC  . insulin NPH Human  20 Units Subcutaneous Q supper  . insulin NPH Human  60 Units Subcutaneous BID WC  . pantoprazole  40 mg Oral BID  . sodium chloride flush  3 mL Intravenous Q12H  . Tafamidis  61 mg Oral Daily    Infusions: . sodium chloride    .  vancomycin      PRN Medications: sodium chloride, acetaminophen, meclizine, ondansetron (ZOFRAN) IV, sodium chloride flush      Assessment/Plan    1.Acute/Chronic diastolic CHF: TEE in 5/64 showed EF 55-60%, mild LVH, stable bioprosthetic aortic valve s/p TAVR.PYP scan in 2/20 suggestive of transthyretin amyloidosis, genetic testing suggestive of wild type. - Volume status improved. IV lasix held yesterday due to AKI. Creatinine better. Will start torsemide 40 daily  - Will need RHC next week.  2. CKD stage 3b:  Creatinine baseline 2.6-2.8 Creatinine on admit 3.5>3.5> 3.0 today.  -Restart diuretics. Follow daily BMET 3. CAD: Cath in 8/19 with moderate RCA and LCx disease, severe distal LAD disease, medical management. - No chest pain. - No ASA given apixaban use.  - Continue pitivastatin 4 mg daily and Praluent.  - Continue fenofibrate and vascepa.  4. Atrial fibrillation: Paroxysmal.  - NSR today. Rate controlled.  - Eliquis held 11/19. Hgb 7.4 -> 7.7   - Continue amiodarone 200 mg daily. - TSH 1.2.  5. PPI:[id number] on admit.  She is off Coreg with junctional bradycardia at 7/21 admission.  - She cannot take amlodipine due to peripheral edema formation.  - Hold off on ACEI/ARB with CKD.  - Stable.  -Continue hydralazine 50 mg three times a day.  - BP improved today 6. PAD: S/p PTCA to right popliteal and right AT in 7/20. Now with left distal SFA-TP trunk bypass and left TMA in 2/21.  - She has followup with vascular surgery at [facility].  - Ordered peripheral arterial dopplers with LE ulcerations to see if there is worsening PAD.  7. 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  - She is on tafamidis, continue.   8. 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  9. Enterococcal endocarditis: Involving mitral valve, 7/21 TEE showed possible old  calcified vegetation.  10. Hyperlipidemia: Lipids good on 8/21 panel, TGs controlled now. Continue current regimen.  11. Mitral valve disease: TEE in 7/21 with mild-moderate MR, mild MS.  12. GERD: - ContinueProtonix 40 mg daily. 13. LLE Cellulitis - Completed 2 rounds or oral antibiotics.  - Receiving IV Vanc.  - Blood cultures obtained 07/02/20  - Check ABI if possible. Not sure she will tolerate with wound  and edema.  - Looks much better today. TRH managing  14. DMII 04/2020 Hgb A1C 9.9  Use SSI. Check hgb A1C.  15. Anemia , Iron deficient - Followed by Dr Fuller Plan Had planned EGD/Colonoscopy but this was postponed due to respiratory studies.  - Has not had oral iron in a week.  -Hgb trending down 7.9>7.4 > 7.7 . No obvious source. Off eliquis - Dr. Henrene Pastor has seen recommend outpatient f/u - If hgb stable tomorrow will restart Eliquis. Start low-dose lovenox   Length of Stay: 2  Glori Bickers, MD  07/04/2020, 11:46 AM  Advanced Heart Failure Team Pager [phone] (M-F; 7a - 4p)  Please contact [facility] for night-coverage after hours (4p -7a ) and weekends on [email or web address]

## 2020-07-05 DIAGNOSIS — I5033 Acute on chronic diastolic (congestive) heart failure: Secondary | ICD-10-CM | POA: Diagnosis not present

## 2020-07-05 DIAGNOSIS — N179 Acute kidney failure, unspecified: Secondary | ICD-10-CM | POA: Diagnosis not present

## 2020-07-05 DIAGNOSIS — L03116 Cellulitis of left lower limb: Secondary | ICD-10-CM | POA: Diagnosis not present

## 2020-07-05 DIAGNOSIS — I10 Essential (primary) hypertension: Secondary | ICD-10-CM | POA: Diagnosis not present

## 2020-07-05 LAB — GLUCOSE, CAPILLARY
Glucose-Capillary: 113 mg/dL — ABNORMAL HIGH (ref 70–99)
Glucose-Capillary: 123 mg/dL — ABNORMAL HIGH (ref 70–99)
Glucose-Capillary: 150 mg/dL — ABNORMAL HIGH (ref 70–99)
Glucose-Capillary: 161 mg/dL — ABNORMAL HIGH (ref 70–99)
Glucose-Capillary: 205 mg/dL — ABNORMAL HIGH (ref 70–99)
Glucose-Capillary: 361 mg/dL — ABNORMAL HIGH (ref 70–99)
Glucose-Capillary: 398 mg/dL — ABNORMAL HIGH (ref 70–99)

## 2020-07-05 LAB — CBC
HCT: 27.5 % — ABNORMAL LOW (ref 36.0–46.0)
Hemoglobin: 7.9 g/dL — ABNORMAL LOW (ref 12.0–15.0)
MCH: 21.2 pg — ABNORMAL LOW (ref 26.0–34.0)
MCHC: 28.7 g/dL — ABNORMAL LOW (ref 30.0–36.0)
MCV: 73.7 fL — ABNORMAL LOW (ref 80.0–100.0)
Platelets: 407 10*3/uL — ABNORMAL HIGH (ref 150–400)
RBC: 3.73 MIL/uL — ABNORMAL LOW (ref 3.87–5.11)
RDW: 23.3 % — ABNORMAL HIGH (ref 11.5–15.5)
WBC: 10.5 10*3/uL (ref 4.0–10.5)
nRBC: 0 % (ref 0.0–0.2)

## 2020-07-05 LAB — BASIC METABOLIC PANEL
Anion gap: 9 (ref 5–15)
BUN: 51 mg/dL — ABNORMAL HIGH (ref 8–23)
CO2: 28 mmol/L (ref 22–32)
Calcium: 8.3 mg/dL — ABNORMAL LOW (ref 8.9–10.3)
Chloride: 100 mmol/L (ref 98–111)
Creatinine, Ser: 3.12 mg/dL — ABNORMAL HIGH (ref 0.44–1.00)
GFR, Estimated: 16 mL/min — ABNORMAL LOW (ref >60)
Glucose, Bld: 124 mg/dL — ABNORMAL HIGH (ref 70–99)
Potassium: 3.8 mmol/L (ref 3.5–5.1)
Sodium: 137 mmol/L (ref 135–145)

## 2020-07-05 MED ORDER — SODIUM CHLORIDE 0.9% FLUSH: 3.0000 mL | INTRAVENOUS | Status: DC | PRN

## 2020-07-05 MED ORDER — APIXABAN 5 MG PO TABS
5.0000 mg | ORAL_TABLET | Freq: Two times a day (BID) | ORAL | Status: DC
  Administered 2020-07-05: 5 mg via ORAL
  Filled 2020-07-05 (×3): qty 1

## 2020-07-05 MED ORDER — SODIUM CHLORIDE 0.9 % IV SOLN
510.0000 mg | Freq: Once | INTRAVENOUS | Status: AC
  Administered 2020-07-05: 510 mg via INTRAVENOUS
  Filled 2020-07-05: qty 17

## 2020-07-05 MED ORDER — SODIUM CHLORIDE 0.9 % IV SOLN: 250.0000 mL | INTRAVENOUS | Status: DC | PRN

## 2020-07-05 MED ORDER — INSULIN NPH (HUMAN) (ISOPHANE) 100 UNIT/ML ~~LOC~~ SUSP: 40.0000 [IU] | Freq: Two times a day (BID) | SUBCUTANEOUS | Status: DC

## 2020-07-05 MED ORDER — SODIUM CHLORIDE 0.9% FLUSH
3.0000 mL | Freq: Two times a day (BID) | INTRAVENOUS | Status: DC
  Administered 2020-07-05 – 2020-07-13 (×12): 3 mL via INTRAVENOUS

## 2020-07-05 MED ORDER — SODIUM CHLORIDE 0.9 % IV SOLN: INTRAVENOUS | Status: DC

## 2020-07-05 NOTE — Progress Notes (Addendum)
Patient ID: [name], female   DOB: November 11, 1950, 69 y.o.   MRN: [id number]  PROGRESS NOTE    GRACELYN COVENTRY  WUJ:[id number] DOB: [birth year] DOA: 07/02/2020 PCP: [name], DO   Brief Narrative:  69 y.o. female with h/o DM; OSA; AS s/p TAVR; obesity s/p gastric sleeve; CAD; HTN; HLD; stage IV CKD; chronic diastolic CHF; and afib on Eliquis was admitted on 07/02/2020 from CHF clinic for acute on chronic diastolic CHF and possible left lower extremity cellulitis.  TRH was consulted for possible left lower extremity cellulitis, failed outpatient treatment with Keflex.  Assessment & Plan:   Left lower extremity stasis dermatitis with ulcerations with?  Cellulitis -She has been treated with antibiotics x3 as an outpatient.  Left lower extremity is slightly erythematous but unclear if this is a bacterial infection versus fluid overload from CHF.  She is currently being treated with IV vancomycin by the primary cardiology team. -Wound care consultation appreciated -ABI pending.  Lower extremity duplex ultrasound on 06/18/2020 was negative for DVT  Acute on chronic diastolic CHF  hypertension Paroxysmal A. Fib Hyperlipidemia -Being primarily managed by heart failure team.  Currently on torsemide as per primary team.  Also on hydralazine and amiodarone.  Also on tafamidis for amyloidosis.  Also on TriCor, Vascepa  Diabetes mellitus type II with hyperglycemia and hypoglycemia -Decrease doses of NPH.  Continue CBGs with SSI.  Blood sugars slightly on the lower side.  AKI on CKD stage IV -Baseline creatinine around 2.8.  Creatinine 3.12 today.  Consider nephrology evaluation if creatinine continues to rise  Anemia of chronic disease -From CKD stage IV.  Hemoglobin 7.9 today.  Recommend transfusion if hemoglobin less than 7.  GI recommends outpatient GI follow-up and to continue Protonix twice a day and chronic iron therapy daily.  Obesity -Outpatient  follow-up   Subjective: Patient seen and examined at bedside.  Still short of breath with minimal exertion.  Feels that her lower extremity swelling and pain is improving.  No overnight fever or vomiting reported. Objective: Vitals:   07/04/20 2102 07/04/20 2212 07/05/20 0041 07/05/20 0425  BP: (!) 132/52 127/61 133/60 118/61  Pulse: 95  93 93  Resp: 20  20 20   Temp: 98.8 F (37.1 C)  97.9 F (36.6 C) 98.5 F (36.9 C)  TempSrc: Oral  Oral Oral  SpO2: 96%   91%  Weight:    89.3 kg  Height:        Intake/Output Summary (Last 24 hours) at 07/05/2020 0755 Last data filed at 07/04/2020 2256 Gross per 24 hour  Intake 1203 ml  Output --  Net 1203 ml   Filed Weights   07/03/20 1553 07/04/20 0509 07/05/20 0425  Weight: 90.8 kg 89.6 kg 89.3 kg    Examination:  General exam: No acute distress. Respiratory system: Bilateral decreased breath sounds at bases with some scattered crackles  cardiovascular system: S1-S2 heard, rate controlled  gastrointestinal system: Abdomen is obese, nondistended, soft and nontender.  Normal bowel sounds heard Extremities: No cyanosis.  Lower extremity edema present with chronic changes with decreasing tenderness; dressing present; left TMA present   Data Reviewed: I have personally reviewed following labs and imaging studies  CBC: Recent Labs  Lab 06/29/20 1106 07/02/20 1628 07/03/20 0248 07/04/20 0258 07/05/20 0258  WBC 9.5 7.2 8.3 8.4 10.5  NEUTROABS 7.8* 6.1  --   --   --   HGB 8.3 Repeated and verified X2.* 7.9* 7.4* 7.7* 7.9*  HCT 27.0*  27.6* 26.1* 27.2* 27.5*  MCV 70.0* 73.4* 73.3* 72.9* 73.7*  PLT 519.0* 476* 414* 409* 578*   Basic Metabolic Panel: Recent Labs  Lab 07/02/20 1628 07/03/20 0248 07/04/20 0258 07/05/20 0258  NA 136 137 137 137  K 3.9 3.8 3.7 3.8  CL 97* 98 98 100  CO2 27 28 28 28   GLUCOSE 373* 185* 84 124*  BUN 58* 61* 55* 51*  CREATININE 3.54* 3.49* 3.03* 3.12*  CALCIUM 8.7* 8.4* 8.5* 8.3*  MG 2.2  --    --   --    GFR: Estimated Creatinine Clearance: 19.2 mL/min (A) (by C-G formula based on SCr of 3.12 mg/dL (H)). Liver Function Tests: Recent Labs  Lab 07/02/20 1628  AST 49*  ALT 36  ALKPHOS 45  BILITOT 1.1  PROT 6.3*  ALBUMIN 2.7*   No results for input(s): LIPASE, AMYLASE in the last 168 hours. No results for input(s): AMMONIA in the last 168 hours. Coagulation Profile: No results for input(s): INR, PROTIME in the last 168 hours. Cardiac Enzymes: No results for input(s): CKTOTAL, CKMB, CKMBINDEX, TROPONINI in the last 168 hours. BNP (last 3 results) No results for input(s): PROBNP in the last 8760 hours. HbA1C: No results for input(s): HGBA1C in the last 72 hours. CBG: Recent Labs  Lab 07/04/20 2209 07/05/20 0040 07/05/20 0224 07/05/20 0423 07/05/20 0751  GLUCAP 138* 161* 123* 113* 150*   Lipid Profile: No results for input(s): CHOL, HDL, LDLCALC, TRIG, CHOLHDL, LDLDIRECT in the last 72 hours. Thyroid Function Tests: Recent Labs    07/02/20 1628  TSH 1.216   Anemia Panel: Recent Labs    07/03/20 1030  VITAMINB12 513  FOLATE 11.8  FERRITIN 30  TIBC 468*  IRON 78  RETICCTPCT 3.9*   Sepsis Labs: No results for input(s): PROCALCITON, LATICACIDVEN in the last 168 hours.  Recent Results (from the past 240 hour(s))  Culture, blood (routine x 2)     Status: None (Preliminary result)   Collection Time: 07/02/20  4:28 PM   Specimen: BLOOD  Result Value Ref Range Status   Specimen Description BLOOD RIGHT ANTECUBITAL  Final   Special Requests   Final    BOTTLES DRAWN AEROBIC AND ANAEROBIC Blood Culture adequate volume   Culture   Final    NO GROWTH 2 DAYS Performed at [facility], [address] [place], [place] [address]    Report Status PENDING  Incomplete  Culture, blood (routine x 2)     Status: None (Preliminary result)   Collection Time: 07/02/20  4:31 PM   Specimen: BLOOD  Result Value Ref Range Status   Specimen Description BLOOD LEFT  ANTECUBITAL  Final   Special Requests   Final    BOTTLES DRAWN AEROBIC AND ANAEROBIC Blood Culture adequate volume   Culture   Final    NO GROWTH 2 DAYS Performed at [facility], [place] [address] [place], [place] [address]    Report Status PENDING  Incomplete  Respiratory Panel by RT PCR (Flu A&B, Covid) - Nasopharyngeal Swab     Status: None   Collection Time: 07/02/20  4:37 PM   Specimen: Nasopharyngeal Swab; Nasopharyngeal(NP) swabs in vial transport medium  Result Value Ref Range Status   SARS Coronavirus 2 by RT PCR NEGATIVE NEGATIVE Final    Comment: (NOTE) SARS-CoV-2 target nucleic acids are NOT DETECTED.  The SARS-CoV-2 RNA is generally detectable in upper respiratoy specimens during the acute phase of infection. The lowest concentration of  SARS-CoV-2 viral copies this assay can detect is 131 copies/mL. A negative result does not preclude SARS-Cov-2 infection and should not be used as the sole basis for treatment or other patient management decisions. A negative result may occur with  improper specimen collection/handling, submission of specimen other than nasopharyngeal swab, presence of viral mutation(s) within the areas targeted by this assay, and inadequate number of viral copies (<131 copies/mL). A negative result must be combined with clinical observations, patient history, and epidemiological information. The expected result is Negative.  Fact Sheet for Patients:  PinkCheek.be  Fact Sheet for Healthcare Providers:  GravelBags.it  This test is no t yet approved or cleared by the [place] FDA and  has been authorized for detection and/or diagnosis of SARS-CoV-2 by FDA under an Emergency Use Authorization (EUA). This EUA will remain  in effect (meaning this test can be used) for the duration of the COVID-19 declaration under Section 564(b)(1) of the Act, 21 U.S.C. section 360bbb-3(b)(1), unless the  authorization is terminated or revoked sooner.     Influenza A by PCR NEGATIVE NEGATIVE Final   Influenza B by PCR NEGATIVE NEGATIVE Final    Comment: (NOTE) The Xpert Xpress SARS-CoV-2/FLU/RSV assay is intended as an aid in  the diagnosis of influenza from Nasopharyngeal swab specimens and  should not be used as a sole basis for treatment. Nasal washings and  aspirates are unacceptable for Xpert Xpress SARS-CoV-2/FLU/RSV  testing.  Fact Sheet for Patients: PinkCheek.be  Fact Sheet for Healthcare Providers: GravelBags.it  This test is not yet approved or cleared by the [place] FDA and  has been authorized for detection and/or diagnosis of SARS-CoV-2 by  FDA under an Emergency Use Authorization (EUA). This EUA will remain  in effect (meaning this test can be used) for the duration of the  Covid-19 declaration under Section 564(b)(1) of the Act, 21  U.S.C. section 360bbb-3(b)(1), unless the authorization is  terminated or revoked. Performed at [facility], [place] [address], [place], [place] [address]          Radiology Studies: No results found.      Scheduled Meds: . amiodarone  200 mg Oral Daily  . docusate sodium  100 mg Oral BID  . enoxaparin (LOVENOX) injection  40 mg Subcutaneous Q24H  . fenofibrate  160 mg Oral Daily  . ferrous sulfate  325 mg Oral BID WC  . hydrALAZINE  50 mg Oral TID  . icosapent Ethyl  2 g Oral BID  . insulin aspart  0-5 Units Subcutaneous QHS  . insulin aspart  0-9 Units Subcutaneous TID WC  . insulin NPH Human  20 Units Subcutaneous Q supper  . insulin NPH Human  60 Units Subcutaneous BID WC  . pantoprazole  40 mg Oral BID  . sodium chloride flush  3 mL Intravenous Q12H  . Tafamidis  61 mg Oral Daily  . torsemide  40 mg Oral Daily   Continuous Infusions: . sodium chloride    . vancomycin 1,000 mg (07/04/20 1814)          Aline August, MD [facility] 07/05/2020, 7:55 AM

## 2020-07-05 NOTE — Progress Notes (Signed)
Advanced Heart Failure Rounding Note  PCP-Cardiologist: Minus Breeding, MD   Subjective:    Admitted with a/c diastolic heart failure and LLE cellulitis.   Oral torsemide restarted yesterday. Creatinine 3.0 -> 3.1. BUN coming down.   Had low CBG this am in 30s. Got 1 amp of D50 and NPH held. Denies SOB, orthopnea or PND. Weight down 1 pound.  Objective:   Weight Range: 89.3 kg Body mass index is 31.76 kg/m.   Vital Signs:   Temp:  [97.9 F (36.6 C)-98.8 F (37.1 C)] 98.5 F (36.9 C) (11/21 0425) Pulse Rate:  [93-97] 93 (11/21 0425) Resp:  [20] 20 (11/21 0425) BP: (108-146)/(48-61) 118/61 (11/21 0425) SpO2:  [91 %-96 %] 91 % (11/21 0425) Weight:  [89.3 kg] 89.3 kg (11/21 0425) Last BM Date: 07/02/20  Weight change: Filed Weights   07/03/20 1553 07/04/20 0509 07/05/20 0425  Weight: 90.8 kg 89.6 kg 89.3 kg    Intake/Output:   Intake/Output Summary (Last 24 hours) at 07/05/2020 0840 Last data filed at 07/04/2020 2256 Gross per 24 hour  Intake 1203 ml  Output --  Net 1203 ml      Physical Exam    General: Obese woman lying in bed No resp difficulty HEENT: normal Neck: supple. no JVD. Carotids 2+ bilat; no bruits. No lymphadenopathy or thryomegaly appreciated. Cor: PMI nondisplaced. Regular rate & rhythm. No rubs, gallops or murmurs. Lungs: clear Abdomen: soft, nontender, nondistended. No hepatosplenomegaly. No bruits or masses. Good bowel sounds. Extremities: no cyanosis, clubbing, rash, tr edema small sore on back of left calf. No erythema Neuro: alert & orientedx3, cranial nerves grossly intact. moves all 4 extremities w/o difficulty. Affect pleasant   Telemetry   NSR  80-90s Personally reviewed  Labs    CBC Recent Labs    07/02/20 1628 07/03/20 0248 07/04/20 0258 07/05/20 0258  WBC 7.2   < > 8.4 10.5  NEUTROABS 6.1  --   --   --   HGB 7.9*   < > 7.7* 7.9*  HCT 27.6*   < > 27.2* 27.5*  MCV 73.4*   < > 72.9* 73.7*  PLT 476*   < > 409*  407*   < > = values in this interval not displayed.   Basic Metabolic Panel Recent Labs    07/02/20 1628 07/03/20 0248 07/04/20 0258 07/05/20 0258  NA 136   < > 137 137  K 3.9   < > 3.7 3.8  CL 97*   < > 98 100  CO2 27   < > 28 28  GLUCOSE 373*   < > 84 124*  BUN 58*   < > 55* 51*  CREATININE 3.54*   < > 3.03* 3.12*  CALCIUM 8.7*   < > 8.5* 8.3*  MG 2.2  --   --   --    < > = values in this interval not displayed.   Liver Function Tests Recent Labs    07/02/20 1628  AST 49*  ALT 36  ALKPHOS 45  BILITOT 1.1  PROT 6.3*  ALBUMIN 2.7*   No results for input(s): LIPASE, AMYLASE in the last 72 hours. Cardiac Enzymes No results for input(s): CKTOTAL, CKMB, CKMBINDEX, TROPONINI in the last 72 hours.  BNP: BNP (last 3 results) Recent Labs    02/21/20 1151 06/03/20 1354 07/02/20 1628  BNP 538.2* 718.7* 1,102.7*    ProBNP (last 3 results) No results for input(s): PROBNP in the last 8760 hours.  D-Dimer No results for input(s): DDIMER in the last 72 hours. Hemoglobin A1C No results for input(s): HGBA1C in the last 72 hours. Fasting Lipid Panel No results for input(s): CHOL, HDL, LDLCALC, TRIG, CHOLHDL, LDLDIRECT in the last 72 hours. Thyroid Function Tests Recent Labs    07/02/20 1628  TSH 1.216    Other results:   Imaging    No results found.   Medications:     Scheduled Medications:  amiodarone  200 mg Oral Daily   docusate sodium  100 mg Oral BID   enoxaparin (LOVENOX) injection  40 mg Subcutaneous Q24H   fenofibrate  160 mg Oral Daily   ferrous sulfate  325 mg Oral BID WC   hydrALAZINE  50 mg Oral TID   icosapent Ethyl  2 g Oral BID   insulin aspart  0-5 Units Subcutaneous QHS   insulin aspart  0-9 Units Subcutaneous TID WC   insulin NPH Human  20 Units Subcutaneous Q supper   insulin NPH Human  40 Units Subcutaneous BID WC   pantoprazole  40 mg Oral BID   sodium chloride flush  3 mL Intravenous Q12H   Tafamidis  61 mg  Oral Daily   torsemide  40 mg Oral Daily    Infusions:  sodium chloride     ferumoxytol     vancomycin 1,000 mg (07/04/20 1814)    PRN Medications: sodium chloride, acetaminophen, meclizine, ondansetron (ZOFRAN) IV, sodium chloride flush      Assessment/Plan    1.Acute/Chronic diastolic CHF: TEE in 6/07 showed EF 55-60%, mild LVH, stable bioprosthetic aortic valve s/p TAVR.PYP scan in 2/20 suggestive of transthyretin amyloidosis, genetic testing suggestive of wild type. - Volume status improved. Back on torsemide. Creatinine relatively stable. Suspect she may be a bit on the dry side. - Will need RHC next week per Dr. Aundra Dubin. Will keep NPO after MN.  2. CKD stage 3b:  Creatinine baseline 2.6-2.8 Creatinine on admit 3.5>3.5> 3.0 > 3.1 today.  -Continue oral diuretics. Follow daily BMET 3. CAD: Cath in 8/19 with moderate RCA and LCx disease, severe distal LAD disease, medical management. - No s/s angina - No ASA given apixaban use.  - Continue pitivastatin 4 mg daily and Praluent.  - Continue fenofibrate and vascepa.  4. Atrial fibrillation: Paroxysmal.  - Remains in NSR - Eliquis held 11/19. Hgb 7.4 -> 7.7 -> 7.9 - Continue amiodarone 200 mg daily. - TSH 1.2.  5. PXT:GGYIRSWNIOEV on admit.  She is off Coreg with junctional bradycardia at 7/21 admission.  - She cannot take amlodipine due to peripheral edema formation.  - Hold off on ACEI/ARB with CKD.  - Stable -Continue hydralazine 50 mg three times a day.  - BP improved today 6. PAD: S/p PTCA to right popliteal and right AT in 7/20. Now with left distal SFA-TP trunk bypass and left TMA in 2/21.  - She has followup with vascular surgery at [facility].  - Ordered peripheral arterial dopplers with LE ulcerations to see if there is worsening PAD.  7. 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  - She is on  tafamidis, continue.   8. 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  9. Enterococcal endocarditis: Involving mitral valve, 7/21 TEE showed possible old calcified vegetation.  10. Hyperlipidemia: Lipids good on 8/21 panel, TGs controlled now. Continue current regimen.  11. Mitral valve disease: TEE in 7/21 with mild-moderate MR, mild MS.  12.  GERD: - ContinueProtonix 40 mg daily. 13. LLE Cellulitis - Completed 2 rounds or oral antibiotics.  - Receiving IV Vanc.  - Blood cultures obtained 07/02/20  - Check ABI if possible. Not sure she will tolerate with wound and edema.  - Looks much better. TRH managing  14. DMII - 04/2020 Hgb A1C 9.9  - CBG low overnight. NPH held - [facility] managing 15. Anemia , Iron deficient - Followed by Dr Fuller Plan Had planned EGD/Colonoscopy but this was postponed due to respiratory studies.  - Has not had oral iron in a week.  -Hgb trending down 7.9>7.4 > 7.7 . No obvious source. Off eliquis - Dr. Henrene Pastor has seen recommend outpatient f/u - HGb stable. Will restart Eliquis - MCV persistently low. Give Feraheme. Discussed dosing with PharmD personally. 16. Debility - need to mobilize   Length of Stay: 3  Glori Bickers, MD  07/05/2020, 8:40 AM  Advanced Heart Failure Team Pager [phone] (M-F; 7a - 4p)  Please contact [facility] for night-coverage after hours (4p -7a ) and weekends on [email or web address]

## 2020-07-05 NOTE — Progress Notes (Signed)
Pharmacy Antibiotic Note  [name] is a 69 y.o. female admitted on 07/02/2020 with cellulitis.  Pharmacy has been consulted for vancomycin dosing.  Scr stable ~3.1, vancomycin dose still appropriate.   Plan: Vancomycin 1 gram IV q48h Trough goal 10-15 mcg/mL Monitor Scr, clinical progress, de-escalation/duration of therapy  Height: 5\' 6"  (167.6 cm) Weight: 89.3 kg (196 lb 12.8 oz) IBW/kg (Calculated) : 59.3  Temp (24hrs), Avg:98.4 F (36.9 C), Min:97.9 F (36.6 C), Max:98.8 F (37.1 C)  Recent Labs  Lab 06/29/20 1106 07/02/20 1628 07/03/20 0248 07/04/20 0258 07/05/20 0258  WBC 9.5 7.2 8.3 8.4 10.5  CREATININE  --  3.54* 3.49* 3.03* 3.12*    Estimated Creatinine Clearance: 19.2 mL/min (A) (by C-G formula based on SCr of 3.12 mg/dL (H)).    Allergies  Allergen Reactions  . Amlodipine Swelling    Ankle swelling  . Atorvastatin Other (See Comments)    Myalgias   . Crestor [Rosuvastatin Calcium] Other (See Comments)    Stiffness and back pain  . Ezetimibe-Simvastatin Other (See Comments)    Leg cramps  . Sitagliptin Other (See Comments)  . Statins Other (See Comments)    Myalgias  . Celebrex [Celecoxib] Rash  . Levemir [Insulin Detemir] Rash    Antimicrobials this admission: Vancomycin 11/18 >>   Dose adjustments this admission:   Microbiology results: 11/18 BCx: NGTD   Thank you for allowing pharmacy to be a part of this patient's care.  Rebbeca Paul, PharmD PGY1 Pharmacy Resident 07/05/2020 9:51 AM  Please check [email or web address] for unit-specific pharmacy phone numbers.

## 2020-07-05 NOTE — Evaluation (Signed)
Physical Therapy Evaluation Patient Details Name: [name] MRN: [id number] DOB: [birth year] Today's Date: 07/05/2020   History of Present Illness  Pt is a 69 y.o. F with significant PMH of DM, OSA, AS s/p TAVR, obesity s/p gastric sleeve, CAD, HTN, stage IV CKD, CHF, and afib who was admitted for acute on chronic diastolic CHF and possible left lower extremity cellulitis.  Clinical Impression  Prior to admission, pt lives with her spouse, requires assist for BADL's, and assist for stand pivot transfers into her w/c or BSC. Pt reporting LLE and back pain (chronic). Pt requiring moderate assist for stand pivot transfer to recliner. Pt husband feels comfortable providing level of assist pt currently requires. Provided them with a gait belt. Will continue to progress as tolerated.     Follow Up Recommendations Home health PT;Supervision/Assistance - 24 hour (declining SNF)    Equipment Recommendations  None recommended by PT    Recommendations for Other Services       Precautions / Restrictions Precautions Precautions: Fall Restrictions Weight Bearing Restrictions: No LLE Weight Bearing: Weight bearing as tolerated      Mobility  Bed Mobility Overal bed mobility: Needs Assistance Bed Mobility: Supine to Sit;Rolling Rolling: Min guard   Supine to sit: Min assist     General bed mobility comments: Rolling to right and left with min guard assist, cues for use of bed rail, minA for trunk to upright sitting position    Transfers Overall transfer level: Needs assistance Equipment used: None Transfers: Sit to/from [facility] Sit to Stand: Mod assist;+2 safety/equipment Stand pivot transfers: Mod assist;+2 safety/equipment       General transfer comment: Pt and pt husband requesting he help assist as +2 with their technique that they do from home. Pt rocking forward for momentum, modA to boost into standing position, cues for stepping initiation and pivot  transfer over to recliner.   Ambulation/Gait                Stairs            Wheelchair Mobility    Modified Rankin (Stroke Patients Only)       Balance Overall balance assessment: Needs assistance Sitting-balance support: Feet supported Sitting balance-Leahy Scale: Fair     Standing balance support: Bilateral upper extremity supported Standing balance-Leahy Scale: Poor                               Pertinent Vitals/Pain Pain Assessment: Faces Faces Pain Scale: Hurts even more Pain Location: back, LLE Pain Descriptors / Indicators: Grimacing;Guarding Pain Intervention(s): Limited activity within patient's tolerance;Monitored during session    [place] expects to be discharged to:: Private residence Living Arrangements: Spouse/significant other Available Help at Discharge: Family Type of Home: House Home Access: Stairs to enter Entrance Stairs-Rails: None Entrance Stairs-Number of Steps: 1 Home Layout: One level Home Equipment: Walker - standard;Wheelchair - [employer];Tub bench      Prior Function Level of Independence: Needs assistance   Gait / Transfers Assistance Needed: Assist for stand pivot transfers recently to [facility] and w/c  ADL's / Homemaking Assistance Needed: Pt reports husband assists with ADL and IADL tasks (washes back and manages water for her, helps with socks, shoes, and starting underwear/pants)        Hand Dominance   Dominant Hand: Right    Extremity/Trunk Assessment   Upper Extremity Assessment Upper Extremity Assessment: Generalized weakness  Lower Extremity Assessment Lower Extremity Assessment: Generalized weakness       Communication   Communication: HOH  Cognition Arousal/Alertness: Awake/alert Behavior During Therapy: WFL for tasks assessed/performed Overall Cognitive Status: Within Functional Limits for tasks assessed                                         General Comments General comments (skin integrity, edema, etc.): VSS on RA    Exercises     Assessment/Plan    PT Assessment Patient needs continued PT services  PT Problem List Decreased strength;Decreased activity tolerance;Decreased balance;Decreased mobility       PT Treatment Interventions DME instruction;Functional mobility training;Therapeutic activities;Therapeutic exercise;Balance training;Patient/family education;Wheelchair mobility training    PT Goals (Current goals can be found in the Care Plan section)  Acute Rehab PT Goals Patient Stated Goal: less pain PT Goal Formulation: With patient/family Time For Goal Achievement: 07/19/20 Potential to Achieve Goals: Fair    Frequency Min 3X/week   Barriers to discharge        Co-evaluation               AM-PAC PT "6 Clicks" Mobility  Outcome Measure Help needed turning from your back to your side while in a flat bed without using bedrails?: A Little Help needed moving from lying on your back to sitting on the side of a flat bed without using bedrails?: A Little Help needed moving to and from a bed to a chair (including a wheelchair)?: A Lot Help needed standing up from a chair using your arms (e.g., wheelchair or bedside chair)?: A Lot Help needed to walk in hospital room?: Total Help needed climbing 3-5 steps with a railing? : Total 6 Click Score: 12    End of Session Equipment Utilized During Treatment: Gait belt Activity Tolerance: Patient tolerated treatment well Patient left: in chair;with call bell/phone within reach;with chair alarm set;with family/visitor present Nurse Communication: Mobility status PT Visit Diagnosis: Unsteadiness on feet (R26.81);Muscle weakness (generalized) (M62.81);Difficulty in walking, not elsewhere classified (R26.2);Pain Pain - Right/Left: Left Pain - part of body: Leg    Time: 3818-2993 PT Time Calculation (min) (ACUTE ONLY): 28 min   Charges:   PT  Evaluation $PT Eval Moderate Complexity: 1 Mod PT Treatments $Therapeutic Activity: 8-22 mins        Wyona Almas, PT, DPT Acute Rehabilitation Services Pager [phone] Office [phone]   Deno Etienne 07/05/2020, 1:56 PM

## 2020-07-06 ENCOUNTER — Inpatient Hospital Stay (HOSPITAL_COMMUNITY): Payer: Medicare Other

## 2020-07-06 ENCOUNTER — Encounter (HOSPITAL_COMMUNITY)
Admission: AD | Disposition: A | Discharge status: Home Health | Payer: Self-pay | Source: Ambulatory Visit | Attending: Cardiology

## 2020-07-06 ENCOUNTER — Encounter (HOSPITAL_COMMUNITY): Payer: Self-pay | Admitting: Cardiology

## 2020-07-06 DIAGNOSIS — I739 Peripheral vascular disease, unspecified: Secondary | ICD-10-CM | POA: Diagnosis not present

## 2020-07-06 DIAGNOSIS — E1165 Type 2 diabetes mellitus with hyperglycemia: Secondary | ICD-10-CM

## 2020-07-06 DIAGNOSIS — L0390 Cellulitis, unspecified: Secondary | ICD-10-CM

## 2020-07-06 DIAGNOSIS — L03116 Cellulitis of left lower limb: Secondary | ICD-10-CM | POA: Diagnosis not present

## 2020-07-06 DIAGNOSIS — E160 Drug-induced hypoglycemia without coma: Secondary | ICD-10-CM

## 2020-07-06 DIAGNOSIS — I5033 Acute on chronic diastolic (congestive) heart failure: Secondary | ICD-10-CM | POA: Diagnosis not present

## 2020-07-06 DIAGNOSIS — N179 Acute kidney failure, unspecified: Secondary | ICD-10-CM | POA: Diagnosis not present

## 2020-07-06 DIAGNOSIS — T383X5A Adverse effect of insulin and oral hypoglycemic [antidiabetic] drugs, initial encounter: Secondary | ICD-10-CM

## 2020-07-06 DIAGNOSIS — I509 Heart failure, unspecified: Secondary | ICD-10-CM

## 2020-07-06 DIAGNOSIS — I10 Essential (primary) hypertension: Secondary | ICD-10-CM | POA: Diagnosis not present

## 2020-07-06 DIAGNOSIS — E118 Type 2 diabetes mellitus with unspecified complications: Secondary | ICD-10-CM

## 2020-07-06 HISTORY — PX: RIGHT HEART CATH: CATH118263

## 2020-07-06 LAB — CBC
HCT: 28.3 % — ABNORMAL LOW (ref 36.0–46.0)
Hemoglobin: 8.2 g/dL — ABNORMAL LOW (ref 12.0–15.0)
MCH: 21.4 pg — ABNORMAL LOW (ref 26.0–34.0)
MCHC: 29 g/dL — ABNORMAL LOW (ref 30.0–36.0)
MCV: 73.9 fL — ABNORMAL LOW (ref 80.0–100.0)
Platelets: 374 10*3/uL (ref 150–400)
RBC: 3.83 MIL/uL — ABNORMAL LOW (ref 3.87–5.11)
RDW: 23.7 % — ABNORMAL HIGH (ref 11.5–15.5)
WBC: 8.7 10*3/uL (ref 4.0–10.5)
nRBC: 0.3 % — ABNORMAL HIGH (ref 0.0–0.2)

## 2020-07-06 LAB — POCT I-STAT EG7
Acid-Base Excess: 6 mmol/L — ABNORMAL HIGH (ref 0.0–2.0)
Acid-Base Excess: 6 mmol/L — ABNORMAL HIGH (ref 0.0–2.0)
Acid-Base Excess: 6 mmol/L — ABNORMAL HIGH (ref 0.0–2.0)
Bicarbonate: 30.5 mmol/L — ABNORMAL HIGH (ref 20.0–28.0)
Bicarbonate: 30.7 mmol/L — ABNORMAL HIGH (ref 20.0–28.0)
Bicarbonate: 31.1 mmol/L — ABNORMAL HIGH (ref 20.0–28.0)
Calcium, Ion: 1.1 mmol/L — ABNORMAL LOW (ref 1.15–1.40)
Calcium, Ion: 1.1 mmol/L — ABNORMAL LOW (ref 1.15–1.40)
Calcium, Ion: 1.11 mmol/L — ABNORMAL LOW (ref 1.15–1.40)
HCT: 28 % — ABNORMAL LOW (ref 36.0–46.0)
HCT: 29 % — ABNORMAL LOW (ref 36.0–46.0)
HCT: 29 % — ABNORMAL LOW (ref 36.0–46.0)
Hemoglobin: 9.5 g/dL — ABNORMAL LOW (ref 12.0–15.0)
Hemoglobin: 9.9 g/dL — ABNORMAL LOW (ref 12.0–15.0)
Hemoglobin: 9.9 g/dL — ABNORMAL LOW (ref 12.0–15.0)
O2 Saturation: 48 %
O2 Saturation: 48 %
O2 Saturation: 59 %
Potassium: 3.9 mmol/L (ref 3.5–5.1)
Potassium: 3.9 mmol/L (ref 3.5–5.1)
Potassium: 3.9 mmol/L (ref 3.5–5.1)
Sodium: 138 mmol/L (ref 135–145)
Sodium: 138 mmol/L (ref 135–145)
Sodium: 139 mmol/L (ref 135–145)
TCO2: 32 mmol/L (ref 22–32)
TCO2: 32 mmol/L (ref 22–32)
TCO2: 33 mmol/L — ABNORMAL HIGH (ref 22–32)
pCO2, Ven: 45.8 mmHg (ref 44.0–60.0)
pCO2, Ven: 46.6 mmHg (ref 44.0–60.0)
pCO2, Ven: 46.8 mmHg (ref 44.0–60.0)
pH, Ven: 7.424 (ref 7.250–7.430)
pH, Ven: 7.432 — ABNORMAL HIGH (ref 7.250–7.430)
pH, Ven: 7.433 — ABNORMAL HIGH (ref 7.250–7.430)
pO2, Ven: 25 mmHg — CL (ref 32.0–45.0)
pO2, Ven: 26 mmHg — CL (ref 32.0–45.0)
pO2, Ven: 31 mmHg — CL (ref 32.0–45.0)

## 2020-07-06 LAB — BASIC METABOLIC PANEL
Anion gap: 14 (ref 5–15)
BUN: 58 mg/dL — ABNORMAL HIGH (ref 8–23)
CO2: 23 mmol/L (ref 22–32)
Calcium: 8.4 mg/dL — ABNORMAL LOW (ref 8.9–10.3)
Chloride: 98 mmol/L (ref 98–111)
Creatinine, Ser: 3.37 mg/dL — ABNORMAL HIGH (ref 0.44–1.00)
GFR, Estimated: 14 mL/min — ABNORMAL LOW (ref >60)
Glucose, Bld: 188 mg/dL — ABNORMAL HIGH (ref 70–99)
Potassium: 4.1 mmol/L (ref 3.5–5.1)
Sodium: 135 mmol/L (ref 135–145)

## 2020-07-06 LAB — GLUCOSE, CAPILLARY
Glucose-Capillary: 183 mg/dL — ABNORMAL HIGH (ref 70–99)
Glucose-Capillary: 148 mg/dL — ABNORMAL HIGH (ref 70–99)
Glucose-Capillary: 143 mg/dL — ABNORMAL HIGH (ref 70–99)
Glucose-Capillary: 267 mg/dL — ABNORMAL HIGH (ref 70–99)
Glucose-Capillary: 280 mg/dL — ABNORMAL HIGH (ref 70–99)

## 2020-07-06 SURGERY — RIGHT HEART CATH
Anesthesia: LOCAL

## 2020-07-06 MED ORDER — APIXABAN 5 MG PO TABS
5.0000 mg | ORAL_TABLET | Freq: Two times a day (BID) | ORAL | Status: DC
  Administered 2020-07-06 – 2020-07-13 (×14): 5 mg via ORAL
  Filled 2020-07-06 (×16): qty 1

## 2020-07-06 MED ORDER — HEPARIN (PORCINE) IN NACL 1000-0.9 UT/500ML-% IV SOLN
INTRAVENOUS | Status: DC | PRN
  Administered 2020-07-06: 500 mL

## 2020-07-06 MED ORDER — INSULIN NPH (HUMAN) (ISOPHANE) 100 UNIT/ML ~~LOC~~ SUSP: 50.0000 [IU] | Freq: Two times a day (BID) | SUBCUTANEOUS | Status: DC

## 2020-07-06 MED ORDER — HEPARIN (PORCINE) IN NACL 1000-0.9 UT/500ML-% IV SOLN
INTRAVENOUS | Status: AC
  Filled 2020-07-06: qty 1000

## 2020-07-06 MED ORDER — FUROSEMIDE 10 MG/ML IJ SOLN
80.0000 mg | Freq: Two times a day (BID) | INTRAMUSCULAR | Status: DC
  Administered 2020-07-06 (×2): 80 mg via INTRAVENOUS
  Filled 2020-07-06 (×2): qty 8

## 2020-07-06 MED ORDER — TORSEMIDE 20 MG PO TABS: 40.0000 mg | ORAL_TABLET | Freq: Every day | ORAL | Status: DC

## 2020-07-06 MED ORDER — LIDOCAINE HCL (PF) 1 % IJ SOLN
INTRAMUSCULAR | Status: DC | PRN
  Administered 2020-07-06: 2 mL

## 2020-07-06 MED ORDER — LIDOCAINE HCL (PF) 1 % IJ SOLN
INTRAMUSCULAR | Status: AC
  Filled 2020-07-06: qty 30

## 2020-07-06 SURGICAL SUPPLY — 7 items
CATH BALLN WEDGE 5F 110CM (CATHETERS) ×1 IMPLANT
KIT HEART LEFT (KITS) ×2 IMPLANT
PACK CARDIAC CATHETERIZATION (CUSTOM PROCEDURE TRAY) ×2 IMPLANT
PROTECTION STATION PRESSURIZED (MISCELLANEOUS) ×2
SHEATH GLIDE SLENDER 4/5FR (SHEATH) ×2 IMPLANT
STATION PROTECTION PRESSURIZED (MISCELLANEOUS) IMPLANT
TRANSDUCER W/STOPCOCK (MISCELLANEOUS) ×2 IMPLANT

## 2020-07-06 NOTE — Progress Notes (Signed)
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  Pt is uncontrolled with HTN, has elevation of creatinine, LLE cellulitis with pain.  PMHx:  anemia, HLD, mitral valve disease and enterococcal endocarditis, PAD, CHF,     PT Comments    Pt is up to side of bed with BP in supine 145/51 and pulse 76, BP 137/72, pulse 95.  Follow along with her for establishing a basic tolerance for gait and transfers.  Pt is limited by fatigue and low endurance.  Will progress with gait and transfers, set up full test of orthostatics as tolerated, and monitor pt for following her reasonable limitations of  Cardio tolerance.    Follow Up Recommendations  Home health PT;Supervision for mobility/OOB     Equipment Recommendations  None recommended by PT    Recommendations for Other Services       Precautions / Restrictions Precautions Precautions: Fall Precaution Comments: vitals as needed Restrictions Weight Bearing Restrictions: No    Mobility  Bed Mobility Overal bed mobility: Needs Assistance Bed Mobility: Supine to Sit;Sit to Supine Rolling: Min assist   Supine to sit: Min assist Sit to supine: Min assist   General bed mobility comments: min assist with PT having to offer active intervention to stop pt from using RUE for pushing off bed  Transfers Overall transfer level: Needs assistance Equipment used: Rolling walker (2 wheeled) Transfers: Sit to/from Stand Sit to Stand: Mod assist         General transfer comment: pt can stand but is in too much pain on LLE to stay up  Ambulation/Gait             General Gait Details: unable to tolerate   Stairs             Wheelchair Mobility    Modified Rankin (Stroke Patients Only)       Balance  Overall balance assessment: Needs assistance Sitting-balance support: Feet supported Sitting balance-Leahy Scale: Good     Standing balance support: Bilateral upper extremity supported Standing balance-Leahy Scale: Poor                              Cognition Arousal/Alertness: Awake/alert Behavior During Therapy: WFL for tasks assessed/performed Overall Cognitive Status: Within Functional Limits for tasks assessed                                        Exercises      General Comments General comments (skin integrity, edema, etc.): pt is able to assist with all movement, requires active PT intervention to avoid using RUE excessively      Pertinent Vitals/Pain Pain Assessment: Faces Faces Pain Scale: Hurts whole lot Pain Location: back, LLE Pain Descriptors / Indicators: Grimacing;Guarding Pain Intervention(s): Limited activity within patient's tolerance;Premedicated before session;Repositioned    Home Living                      Prior Function            PT Goals (current goals can now be found in the care plan section) Acute Rehab PT Goals Patient Stated Goal: less pain  Progress towards PT goals: Progressing toward goals    Frequency    Min 3X/week      PT Plan Current plan remains appropriate    Co-evaluation              AM-PAC PT "6 Clicks" Mobility   Outcome Measure  Help needed turning from your back to your side while in a flat bed without using bedrails?: A Little Help needed moving from lying on your back to sitting on the side of a flat bed without using bedrails?: A Little Help needed moving to and from a bed to a chair (including a wheelchair)?: A Little Help needed standing up from a chair using your arms (e.g., wheelchair or bedside chair)?: A Little Help needed to walk in hospital room?: A Lot Help needed climbing 3-5 steps with a railing? : A Lot 6 Click Score: 16    End of Session Equipment  Utilized During Treatment: Gait belt Activity Tolerance: Patient tolerated treatment well Patient left: in bed;with call bell/phone within reach;with bed alarm set Nurse Communication: Mobility status PT Visit Diagnosis: Unsteadiness on feet (R26.81);Muscle weakness (generalized) (M62.81);Difficulty in walking, not elsewhere classified (R26.2);Pain Pain - Right/Left: Left Pain - part of body: Leg     Time: 0931-1216 PT Time Calculation (min) (ACUTE ONLY): 24 min  Charges:  $Therapeutic Exercise: 8-22 mins $Therapeutic Activity: 8-22 mins                  Ramond Dial 07/06/2020, 8:44 PM  Mee Hives, PT MS Acute Rehab Dept. Number: [place] and [place]

## 2020-07-06 NOTE — Progress Notes (Signed)
Patient ID: [name], female   DOB: [birth year], 69 y.o.   MRN: [id number]  PROGRESS NOTE    SHAHEEN STAR  JKK:[id number] DOB: 21-Aug-1950 DOA: 07/02/2020 PCP: [name], DO   Brief Narrative:  69 y.o. female with h/o DM; OSA; AS s/p TAVR; obesity s/p gastric sleeve; CAD; HTN; HLD; stage IV CKD; chronic diastolic CHF; and afib on Eliquis was admitted on 07/02/2020 from CHF clinic for acute on chronic diastolic CHF and possible left lower extremity cellulitis.  TRH was consulted for possible left lower extremity cellulitis, failed outpatient treatment with Keflex.  Assessment & Plan:   Left lower extremity stasis dermatitis with ulcerations with?  Cellulitis -She has been treated with antibiotics x3 as an outpatient.  Left lower extremity is slightly erythematous but unclear if this is a bacterial infection versus fluid overload from CHF.  She is currently being treated with IV vancomycin by the primary cardiology team. Recommend stopping antibiotics and monitor off antibiotics -Wound care consultation appreciated -ABI pending.  Lower extremity duplex ultrasound on 06/18/2020 was negative for DVT  Acute on chronic diastolic CHF  hypertension Paroxysmal A. Fib Hyperlipidemia -Being primarily managed by heart failure team.  Currently on torsemide as per primary team.  Also on hydralazine and amiodarone.  Also on tafamidis for amyloidosis.  Also on TriCor, Vascepa  Diabetes mellitus type II with hyperglycemia and hypoglycemia -NPH doses were decreased on 07/05/2020. Subsequently blood sugars are on the higher side again. Will increase the dose of NPH elevated. Continue CBGs with SSI.    AKI on CKD stage IV -Baseline creatinine around 2.8.  Creatinine 3.37 today.  Consider nephrology evaluation if creatinine continues to rise  Anemia of chronic disease -From CKD stage IV.  Hemoglobin 8.2 today.  Recommend transfusion if hemoglobin less than 7.  GI recommends outpatient GI  follow-up and to continue Protonix twice a day and chronic iron therapy daily.  Obesity -Outpatient follow-up   Subjective: Patient seen and examined at bedside. Denies worsening left lower extremity pain. Feels that her lower extremity swelling is improving. Still short of breath with exertion. No overnight fever or vomiting reported.  Objective: Vitals:   07/05/20 1950 07/05/20 2145 07/05/20 2332 07/06/20 0322  BP:  (!) 126/59 130/62 (!) 111/56  Pulse:  76 75 72  Resp: 18  18 18   Temp: 98 F (36.7 C)  98 F (36.7 C) 98.7 F (37.1 C)  TempSrc:   Oral Oral  SpO2: 96%  98% 98%  Weight:    89.8 kg  Height:        Intake/Output Summary (Last 24 hours) at 07/06/2020 0756 Last data filed at 07/05/2020 2203 Gross per 24 hour  Intake 843 ml  Output 1300 ml  Net -457 ml   Filed Weights   07/04/20 0509 07/05/20 0425 07/06/20 0322  Weight: 89.6 kg 89.3 kg 89.8 kg    Examination:  General exam: No distress Respiratory system: Bilateral decreased breath sounds at bases scattered crackles, no wheezing  cardiovascular system: Rate is controlled, S1-S2 heard gastrointestinal system: Abdomen is obese, nondistended, soft and nontender. Bowel sounds are heard Extremities: No clubbing. Left lower extremity edema present with chronic changes with mild tenderness which is improving; dressing present; left TMA present   Data Reviewed: I have personally reviewed following labs and imaging studies  CBC: Recent Labs  Lab 06/29/20 1106 06/29/20 1106 07/02/20 1628 07/03/20 0248 07/04/20 0258 07/05/20 0258 07/06/20 0320  WBC 9.5   < > 7.2  8.3 8.4 10.5 8.7  NEUTROABS 7.8*  --  6.1  --   --   --   --   HGB 8.3 Repeated and verified X2.*   < > 7.9* 7.4* 7.7* 7.9* 8.2*  HCT 27.0*   < > 27.6* 26.1* 27.2* 27.5* 28.3*  MCV 70.0*   < > 73.4* 73.3* 72.9* 73.7* 73.9*  PLT 519.0*   < > 476* 414* 409* 407* 374   < > = values in this interval not displayed.   Basic Metabolic Panel: Recent  Labs  Lab 07/02/20 1628 07/03/20 0248 07/04/20 0258 07/05/20 0258 07/06/20 0320  NA 136 137 137 137 135  K 3.9 3.8 3.7 3.8 4.1  CL 97* 98 98 100 98  CO2 27 28 28 28 23   GLUCOSE 373* 185* 84 124* 188*  BUN 58* 61* 55* 51* 58*  CREATININE 3.54* 3.49* 3.03* 3.12* 3.37*  CALCIUM 8.7* 8.4* 8.5* 8.3* 8.4*  MG 2.2  --   --   --   --    GFR: Estimated Creatinine Clearance: 17.8 mL/min (A) (by C-G formula based on SCr of 3.37 mg/dL (H)). Liver Function Tests: Recent Labs  Lab 07/02/20 1628  AST 49*  ALT 36  ALKPHOS 45  BILITOT 1.1  PROT 6.3*  ALBUMIN 2.7*   No results for input(s): LIPASE, AMYLASE in the last 168 hours. No results for input(s): AMMONIA in the last 168 hours. Coagulation Profile: No results for input(s): INR, PROTIME in the last 168 hours. Cardiac Enzymes: No results for input(s): CKTOTAL, CKMB, CKMBINDEX, TROPONINI in the last 168 hours. BNP (last 3 results) No results for input(s): PROBNP in the last 8760 hours. HbA1C: No results for input(s): HGBA1C in the last 72 hours. CBG: Recent Labs  Lab 07/05/20 0751 07/05/20 1139 07/05/20 1705 07/05/20 2055 07/06/20 0751  GLUCAP 150* 205* 398* 361* 183*   Lipid Profile: No results for input(s): CHOL, HDL, LDLCALC, TRIG, CHOLHDL, LDLDIRECT in the last 72 hours. Thyroid Function Tests: No results for input(s): TSH, T4TOTAL, FREET4, T3FREE, THYROIDAB in the last 72 hours. Anemia Panel: Recent Labs    07/03/20 1030  VITAMINB12 513  FOLATE 11.8  FERRITIN 30  TIBC 468*  IRON 78  RETICCTPCT 3.9*   Sepsis Labs: No results for input(s): PROCALCITON, LATICACIDVEN in the last 168 hours.  Recent Results (from the past 240 hour(s))  Culture, blood (routine x 2)     Status: None (Preliminary result)   Collection Time: 07/02/20  4:28 PM   Specimen: BLOOD  Result Value Ref Range Status   Specimen Description BLOOD RIGHT ANTECUBITAL  Final   Special Requests   Final    BOTTLES DRAWN AEROBIC AND ANAEROBIC  Blood Culture adequate volume   Culture   Final    NO GROWTH 3 DAYS Performed at [facility], [address] [place], [place] [address]    Report Status PENDING  Incomplete  Culture, blood (routine x 2)     Status: None (Preliminary result)   Collection Time: 07/02/20  4:31 PM   Specimen: BLOOD  Result Value Ref Range Status   Specimen Description BLOOD LEFT ANTECUBITAL  Final   Special Requests   Final    BOTTLES DRAWN AEROBIC AND ANAEROBIC Blood Culture adequate volume   Culture   Final    NO GROWTH 3 DAYS Performed at [facility], [place] [address] [place], [place] [address]    Report Status PENDING  Incomplete  Respiratory Panel by  RT PCR (Flu A&B, Covid) - Nasopharyngeal Swab     Status: None   Collection Time: 07/02/20  4:37 PM   Specimen: Nasopharyngeal Swab; Nasopharyngeal(NP) swabs in vial transport medium  Result Value Ref Range Status   SARS Coronavirus 2 by RT PCR NEGATIVE NEGATIVE Final    Comment: (NOTE) SARS-CoV-2 target nucleic acids are NOT DETECTED.  The SARS-CoV-2 RNA is generally detectable in upper respiratoy specimens during the acute phase of infection. The lowest concentration of SARS-CoV-2 viral copies this assay can detect is 131 copies/mL. A negative result does not preclude SARS-Cov-2 infection and should not be used as the sole basis for treatment or other patient management decisions. A negative result may occur with  improper specimen collection/handling, submission of specimen other than nasopharyngeal swab, presence of viral mutation(s) within the areas targeted by this assay, and inadequate number of viral copies (<131 copies/mL). A negative result must be combined with clinical observations, patient history, and epidemiological information. The expected result is Negative.  Fact Sheet for Patients:  PinkCheek.be  Fact Sheet for Healthcare Providers:   GravelBags.it  This test is no t yet approved or cleared by the [place] FDA and  has been authorized for detection and/or diagnosis of SARS-CoV-2 by FDA under an Emergency Use Authorization (EUA). This EUA will remain  in effect (meaning this test can be used) for the duration of the COVID-19 declaration under Section 564(b)(1) of the Act, 21 U.S.C. section 360bbb-3(b)(1), unless the authorization is terminated or revoked sooner.     Influenza A by PCR NEGATIVE NEGATIVE Final   Influenza B by PCR NEGATIVE NEGATIVE Final    Comment: (NOTE) The Xpert Xpress SARS-CoV-2/FLU/RSV assay is intended as an aid in  the diagnosis of influenza from Nasopharyngeal swab specimens and  should not be used as a sole basis for treatment. Nasal washings and  aspirates are unacceptable for Xpert Xpress SARS-CoV-2/FLU/RSV  testing.  Fact Sheet for Patients: PinkCheek.be  Fact Sheet for Healthcare Providers: GravelBags.it  This test is not yet approved or cleared by the [place] FDA and  has been authorized for detection and/or diagnosis of SARS-CoV-2 by  FDA under an Emergency Use Authorization (EUA). This EUA will remain  in effect (meaning this test can be used) for the duration of the  Covid-19 declaration under Section 564(b)(1) of the Act, 21  U.S.C. section 360bbb-3(b)(1), unless the authorization is  terminated or revoked. Performed at [facility], [place] [address] [place], [place] [address]          Radiology Studies: No results found.      Scheduled Meds: . amiodarone  200 mg Oral Daily  . apixaban  5 mg Oral BID  . docusate sodium  100 mg Oral BID  . fenofibrate  160 mg Oral Daily  . ferrous sulfate  325 mg Oral BID WC  . hydrALAZINE  50 mg Oral TID  . icosapent Ethyl  2 g Oral BID  . insulin aspart  0-5 Units Subcutaneous QHS  . insulin aspart  0-9 Units Subcutaneous  TID WC  . insulin NPH Human  20 Units Subcutaneous Q supper  . insulin NPH Human  40 Units Subcutaneous BID WC  . pantoprazole  40 mg Oral BID  . sodium chloride flush  3 mL Intravenous Q12H  . sodium chloride flush  3 mL Intravenous Q12H  . Tafamidis  61 mg Oral Daily  . torsemide  40 mg Oral Daily   Continuous Infusions: .  sodium chloride    . sodium chloride    . sodium chloride    . vancomycin 1,000 mg (07/04/20 1814)          Aline August, MD [facility] 07/06/2020, 7:56 AM

## 2020-07-06 NOTE — Progress Notes (Signed)
OT Cancellation Note  Patient Details Name: [name] MRN: [id number] DOB: 1950/11/27   Cancelled Treatment:    Reason Eval/Treat Not Completed: Patient at procedure or test/ unavailable on attempt pt is OTF at heart cath, OT will continue with attempts schedule permitting   Rufino Staup OTR/L acute rehab services Office: [place] 07/06/2020, 9:21AM

## 2020-07-06 NOTE — Progress Notes (Addendum)
Advanced Heart Failure Rounding Note  PCP-Cardiologist: Minus Breeding, MD   Subjective:    Admitted with a/c diastolic heart failure and LLE cellulitis. On IV Vanc. Blood cultures NGTD.   Diuresed w/ IV Lasix, switched back to oral torsemide over the weekend.   SCr/ BUN trending up,  SCr 3.03>>3.12>>3.37. BUN 51>>58.   Had low CBG yesterday morning in 30s. Got 1 amp of D50 and NPH held. No further hypoglycemia.   Hgb trending up, 7.7>>7.9>>8.2.     Objective:   Weight Range: 89.8 kg Body mass index is 31.95 kg/m.   Vital Signs:   Temp:  [97.9 F (36.6 C)-98.7 F (37.1 C)] 98.7 F (37.1 C) (11/22 0322) Pulse Rate:  [72-83] 72 (11/22 0322) Resp:  [18] 18 (11/22 0322) BP: (111-139)/(56-67) 111/56 (11/22 0322) SpO2:  [96 %-98 %] 98 % (11/22 0322) Weight:  [89.8 kg] 89.8 kg (11/22 0322) Last BM Date: 07/04/20  Weight change: Filed Weights   07/04/20 0509 07/05/20 0425 07/06/20 0322  Weight: 89.6 kg 89.3 kg 89.8 kg    Intake/Output:   Intake/Output Summary (Last 24 hours) at 07/06/2020 0754 Last data filed at 07/05/2020 2203 Gross per 24 hour  Intake 843 ml  Output 1300 ml  Net -457 ml      Physical Exam     General:  Supper morbidly obese WF, laying in bed. No respiratory difficulty HEENT: normal Neck: supple. Thick neck, JVD not well visualized. Carotids 2+ bilat; no bruits. No lymphadenopathy or thyromegaly appreciated. Cor: PMI nondisplaced. Regular rate & rhythm. No rubs, gallops or murmurs. Lungs: clear Abdomen: obese, soft, nontender, nondistended. No hepatosplenomegaly. No bruits or masses. Good bowel sounds. Extremities: no cyanosis, clubbing, rash, edema Neuro: alert & oriented x 3, cranial nerves grossly intact. moves all 4 extremities w/o difficulty. Affect pleasant.    Telemetry   NSR 90s Personally reviewed  Labs    CBC Recent Labs    07/05/20 0258 07/06/20 0320  WBC 10.5 8.7  HGB 7.9* 8.2*  HCT 27.5* 28.3*  MCV 73.7*  73.9*  PLT 407* 573   Basic Metabolic Panel Recent Labs    07/05/20 0258 07/06/20 0320  NA 137 135  K 3.8 4.1  CL 100 98  CO2 28 23  GLUCOSE 124* 188*  BUN 51* 58*  CREATININE 3.12* 3.37*  CALCIUM 8.3* 8.4*   Liver Function Tests No results for input(s): AST, ALT, ALKPHOS, BILITOT, PROT, ALBUMIN in the last 72 hours. No results for input(s): LIPASE, AMYLASE in the last 72 hours. Cardiac Enzymes No results for input(s): CKTOTAL, CKMB, CKMBINDEX, TROPONINI in the last 72 hours.  BNP: BNP (last 3 results) Recent Labs    02/21/20 1151 06/03/20 1354 07/02/20 1628  BNP 538.2* 718.7* 1,102.7*    ProBNP (last 3 results) No results for input(s): PROBNP in the last 8760 hours.   D-Dimer No results for input(s): DDIMER in the last 72 hours. Hemoglobin A1C No results for input(s): HGBA1C in the last 72 hours. Fasting Lipid Panel No results for input(s): CHOL, HDL, LDLCALC, TRIG, CHOLHDL, LDLDIRECT in the last 72 hours. Thyroid Function Tests No results for input(s): TSH, T4TOTAL, T3FREE, THYROIDAB in the last 72 hours.  Invalid input(s): FREET3  Other results:   Imaging    No results found.   Medications:     Scheduled Medications: . amiodarone  200 mg Oral Daily  . apixaban  5 mg Oral BID  . docusate sodium  100 mg Oral BID  .  fenofibrate  160 mg Oral Daily  . ferrous sulfate  325 mg Oral BID WC  . hydrALAZINE  50 mg Oral TID  . icosapent Ethyl  2 g Oral BID  . insulin aspart  0-5 Units Subcutaneous QHS  . insulin aspart  0-9 Units Subcutaneous TID WC  . insulin NPH Human  20 Units Subcutaneous Q supper  . insulin NPH Human  40 Units Subcutaneous BID WC  . pantoprazole  40 mg Oral BID  . sodium chloride flush  3 mL Intravenous Q12H  . sodium chloride flush  3 mL Intravenous Q12H  . Tafamidis  61 mg Oral Daily  . torsemide  40 mg Oral Daily    Infusions: . sodium chloride    . sodium chloride    . sodium chloride    . vancomycin 1,000 mg  (07/04/20 1814)    PRN Medications: sodium chloride, sodium chloride, acetaminophen, meclizine, ondansetron (ZOFRAN) IV, sodium chloride flush, sodium chloride flush      Assessment/Plan    1.Acute/Chronic diastolic CHF: TEE in 7/61 showed EF 55-60%, mild LVH, stable bioprosthetic aortic valve s/p TAVR.PYP scan in 2/20 suggestive of transthyretin amyloidosis, genetic testing suggestive of wild type. - Volume status improved. Back on torsemide. Creatinine/ BUN trending up. Suspect she may be a bit on the dry side. - Hold next dose of torsemide for now  - Will need RHC, plan for today  2. CKD stage 3b:  Creatinine baseline 2.6-2.8 Creatinine on admit 3.5>3.5> 3.0 > 3.1>3.4 today.  - Hold next dose of torsemide. Plan RHC today  - follow BMP  3. CAD: Cath in 8/19 with moderate RCA and LCx disease, severe distal LAD disease, medical management. - No s/s angina - No ASA given apixaban use.  - Continue pitivastatin 4 mg daily and Praluent.  - Continue fenofibrate and vascepa.  4. Atrial fibrillation: Paroxysmal.  - Remains in NSR - Eliquis held 11/19. Hgb 7.4 -> 7.7 -> 7.9->8.2 - Continue amiodarone 200 mg daily. - TSH 1.2.  5. YWV:[id number] on admit.  She is off Coreg with junctional bradycardia at 7/21 admission.  - She cannot take amlodipine due to peripheral edema formation.  - Hold off on ACEI/ARB with CKD.  -Continue hydralazine 50 mg three times a day.  - BP improved today. Continue to monitor  6. PAD: S/p PTCA to right popliteal and right AT in 7/20. Now with left distal SFA-TP trunk bypass and left TMA in 2/21.  - She has followup with vascular surgery at [place].  - Ordered peripheral arterial dopplers with LE ulcerations to see if there is worsening PAD. Not completed yet 7. 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  - She is on tafamidis, continue.   8.  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  9. Enterococcal endocarditis: Involving mitral valve, 7/21 TEE showed possible old calcified vegetation.  10. Hyperlipidemia: Lipids good on 8/21 panel, TGs controlled now. Continue current regimen.  11. Mitral valve disease: TEE in 7/21 with mild-moderate MR, mild MS.  12. GERD: - ContinueProtonix 40 mg daily. 13. LLE Cellulitis - Completed 2 rounds or oral antibiotics.  - Receiving IV Vanc.  - Blood cultures obtained 07/02/20. NGTD - Check ABI if possible. Not sure she will tolerate with wound and edema.  - Looks much better. TRH managing  14. DMII - 04/2020 Hgb A1C 9.9  - CBG low 11/21. Got 1 amp of D50. No  further hypoglycemia  - TRH managing 15. Anemia , Iron deficient - Followed by Dr Fuller Plan Had planned EGD/Colonoscopy but this was postponed due to respiratory studies.  - Has not had oral iron in a week.  - Hgb down to 7.4 this admit . No obvious source of bleeding             - MCV persistently low. Given Feraheme 11/21 - Dr. Henrene Pastor has seen recommend outpatient f/u - Eliquis restarted. Hgb trending back up, 8.2 today  - continue to follow  16. Debility - need to mobilize   Length of Stay: [address] [name]  07/06/2020, 7:54 AM  Advanced Heart Failure Team Pager [phone] (M-F; 7a - 4p)  Please contact [facility] for night-coverage after hours (4p -7a ) and weekends on [email or web address]  Patient seen with PA, agree with the above note.   Still with left lower leg pain but significantly decreased erythema.  Creatinine up to 3.37.  Got feraheme yesterday, hgb up to 8.2.    General: NAD Neck: Thick, JVP difficult, no thyromegaly or thyroid nodule.  Lungs: Decreased at bases.  CV: Nondisplaced PMI.  Heart regular S1/S2, no S3/S4, no murmur.  No peripheral edema.   Abdomen: Soft, nontender, no hepatosplenomegaly, no distention.  Skin: Intact without lesions or rashes. Erythema resolved LLE.    Neurologic: Alert and oriented x 3.  Psych: Normal affect. Extremities: No clubbing or cyanosis. S/p left TMA.  HEENT: Normal.   Acute on chronic diastolic CHF, diuresed with IV Lasix initially.  Creatinine higher at 3.37 today. Exam difficult for volume.  - Hold torsemide today.  - Plan for RHC to better assess volume status, will do today.  Discussed risks/benefits with patient and she agrees.   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    H/o Fe deficiency anemia, GI working up as outpatient but has not had scopes yet.  Hgb down to 7.4 here.  Has been FOBT positive.  Seen by GI, not thought to have significant active bleeding, and they elected to defer inpatient endoscopy.  She has had feraheme, hgb up to 8.2.  - Restarted Eliquis (hold am dose today for RHC).    Concern for LLE cellulitis, has ulcerations LLE.  Erythema LLE much improved with IV vancomycin.  Getting qod currently.   - Would continue vancomycin for now.     - With ulcerations, will also check peripheral arterial dopplers to look for worsening of her baseline PAD => still pending.   She is in an accerated junctional rhythm (stable in 70s).  Has been seen in past.  Continue amiodarone for now.   [name] 07/06/2020 8:41 AM

## 2020-07-06 NOTE — Progress Notes (Signed)
VASCULAR LAB    Left lower extremity arterial duplex has been performed.  See CV proc for preliminary results.   Makeila Yamaguchi, RVT 07/06/2020, 3:24 PM

## 2020-07-06 NOTE — Progress Notes (Signed)
Pt is in a heart cath and will be on bedrest afterward.  Retry as time and pt allow.   07/06/20 0900  PT Visit Information  Reason Eval/Treat Not Completed Patient at procedure or test/unavailable    Mee Hives, PT MS Acute Rehab Dept. Number: [place] and [place]

## 2020-07-06 NOTE — Interval H&P Note (Signed)
History and Physical Interval Note:  07/06/2020 9:08 AM  [name]  has presented today for surgery, with the diagnosis of hf.  The various methods of treatment have been discussed with the patient and family. After consideration of risks, benefits and other options for treatment, the patient has consented to  Procedure(s): RIGHT HEART CATH (N/A) as a surgical intervention.  The patient's history has been reviewed, patient examined, no change in status, stable for surgery.  I have reviewed the patient's chart and labs.  Questions were answered to the patient's satisfaction.     Raysean Graumann [employer]

## 2020-07-06 NOTE — H&P (View-Only) (Signed)
Advanced Heart Failure Rounding Note  PCP-Cardiologist: Minus Breeding, MD   Subjective:    Admitted with a/c diastolic heart failure and LLE cellulitis. On IV Vanc. Blood cultures NGTD.   Diuresed w/ IV Lasix, switched back to oral torsemide over the weekend.   SCr/ BUN trending up,  SCr 3.03>>3.12>>3.37. BUN 51>>58.   Had low CBG yesterday morning in 30s. Got 1 amp of D50 and NPH held. No further hypoglycemia.   Hgb trending up, 7.7>>7.9>>8.2.     Objective:   Weight Range: 89.8 kg Body mass index is 31.95 kg/m.   Vital Signs:   Temp:  [97.9 F (36.6 C)-98.7 F (37.1 C)] 98.7 F (37.1 C) (11/22 0322) Pulse Rate:  [72-83] 72 (11/22 0322) Resp:  [18] 18 (11/22 0322) BP: (111-139)/(56-67) 111/56 (11/22 0322) SpO2:  [96 %-98 %] 98 % (11/22 0322) Weight:  [89.8 kg] 89.8 kg (11/22 0322) Last BM Date: 07/04/20  Weight change: Filed Weights   07/04/20 0509 07/05/20 0425 07/06/20 0322  Weight: 89.6 kg 89.3 kg 89.8 kg    Intake/Output:   Intake/Output Summary (Last 24 hours) at 07/06/2020 0754 Last data filed at 07/05/2020 2203 Gross per 24 hour  Intake 843 ml  Output 1300 ml  Net -457 ml      Physical Exam     General:  Supper morbidly obese WF, laying in bed. No respiratory difficulty HEENT: normal Neck: supple. Thick neck, JVD not well visualized. Carotids 2+ bilat; no bruits. No lymphadenopathy or thyromegaly appreciated. Cor: PMI nondisplaced. Regular rate & rhythm. No rubs, gallops or murmurs. Lungs: clear Abdomen: obese, soft, nontender, nondistended. No hepatosplenomegaly. No bruits or masses. Good bowel sounds. Extremities: no cyanosis, clubbing, rash, edema Neuro: alert & oriented x 3, cranial nerves grossly intact. moves all 4 extremities w/o difficulty. Affect pleasant.    Telemetry   NSR 90s Personally reviewed  Labs    CBC Recent Labs    07/05/20 0258 07/06/20 0320  WBC 10.5 8.7  HGB 7.9* 8.2*  HCT 27.5* 28.3*  MCV 73.7*  73.9*  PLT 407* 300   Basic Metabolic Panel Recent Labs    07/05/20 0258 07/06/20 0320  NA 137 135  K 3.8 4.1  CL 100 98  CO2 28 23  GLUCOSE 124* 188*  BUN 51* 58*  CREATININE 3.12* 3.37*  CALCIUM 8.3* 8.4*   Liver Function Tests No results for input(s): AST, ALT, ALKPHOS, BILITOT, PROT, ALBUMIN in the last 72 hours. No results for input(s): LIPASE, AMYLASE in the last 72 hours. Cardiac Enzymes No results for input(s): CKTOTAL, CKMB, CKMBINDEX, TROPONINI in the last 72 hours.  BNP: BNP (last 3 results) Recent Labs    02/21/20 1151 06/03/20 1354 07/02/20 1628  BNP 538.2* 718.7* 1,102.7*    ProBNP (last 3 results) No results for input(s): PROBNP in the last 8760 hours.   D-Dimer No results for input(s): DDIMER in the last 72 hours. Hemoglobin A1C No results for input(s): HGBA1C in the last 72 hours. Fasting Lipid Panel No results for input(s): CHOL, HDL, LDLCALC, TRIG, CHOLHDL, LDLDIRECT in the last 72 hours. Thyroid Function Tests No results for input(s): TSH, T4TOTAL, T3FREE, THYROIDAB in the last 72 hours.  Invalid input(s): FREET3  Other results:   Imaging    No results found.   Medications:     Scheduled Medications: . amiodarone  200 mg Oral Daily  . apixaban  5 mg Oral BID  . docusate sodium  100 mg Oral BID  .  fenofibrate  160 mg Oral Daily  . ferrous sulfate  325 mg Oral BID WC  . hydrALAZINE  50 mg Oral TID  . icosapent Ethyl  2 g Oral BID  . insulin aspart  0-5 Units Subcutaneous QHS  . insulin aspart  0-9 Units Subcutaneous TID WC  . insulin NPH Human  20 Units Subcutaneous Q supper  . insulin NPH Human  40 Units Subcutaneous BID WC  . pantoprazole  40 mg Oral BID  . sodium chloride flush  3 mL Intravenous Q12H  . sodium chloride flush  3 mL Intravenous Q12H  . Tafamidis  61 mg Oral Daily  . torsemide  40 mg Oral Daily    Infusions: . sodium chloride    . sodium chloride    . sodium chloride    . 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 - Volume status improved. Back on torsemide. Creatinine/ BUN trending up. Suspect she may be a bit on the dry side. - Hold next dose of torsemide for now  - Will need RHC, plan for today  2. CKD stage 3b:  Creatinine baseline 2.6-2.8 Creatinine on admit 3.5>3.5> 3.0 > 3.1>3.4 today.  - Hold next dose of torsemide. Plan RHC today  - follow BMP  3. CAD: Cath in 8/19 with moderate RCA and LCx disease, severe distal LAD disease, medical management. - No s/s angina - No ASA given apixaban use.  - Continue pitivastatin 4 mg daily and Praluent.  - Continue fenofibrate and vascepa.  4. Atrial fibrillation: Paroxysmal.  - Remains in NSR - Eliquis held 11/19. Hgb 7.4 -> 7.7 -> 7.9->8.2 - Continue amiodarone 200 mg daily. - TSH 1.2.  5. SEG:[id number] on admit.  She is off Coreg with junctional bradycardia at 7/21 admission.  - She cannot take amlodipine due to peripheral edema formation.  - Hold off on ACEI/ARB with CKD.  -Continue hydralazine 50 mg three times a day.  - BP improved today. Continue to monitor  6. PAD: S/p PTCA to right popliteal and right AT in 7/20. Now with left distal SFA-TP trunk bypass and left TMA in 2/21.  - She has followup with vascular surgery at [place].  - Ordered peripheral arterial dopplers with LE ulcerations to see if there is worsening PAD. Not completed yet 7. 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  - She is on tafamidis, continue.   8.  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  9. Enterococcal endocarditis: Involving mitral valve, 7/21 TEE showed possible old calcified vegetation.  10. Hyperlipidemia: Lipids good on 8/21 panel, TGs controlled now. Continue current regimen.  11. Mitral valve disease: TEE in 7/21 with mild-moderate MR, mild MS.  12. GERD: - ContinueProtonix 40 mg daily. 13. LLE Cellulitis - Completed 2 rounds or oral antibiotics.  - Receiving IV Vanc.  - Blood cultures obtained 07/02/20. NGTD - Check ABI if possible. Not sure she will tolerate with wound and edema.  - Looks much better. TRH managing  14. DMII - 04/2020 Hgb A1C 9.9  - CBG low 11/21. Got 1 amp of D50. No  further hypoglycemia  - TRH managing 15. Anemia , Iron deficient - Followed by Dr Fuller Plan Had planned EGD/Colonoscopy but this was postponed due to respiratory studies.  - Has not had oral iron in a week.  - Hgb down to 7.4 this admit . No obvious source of bleeding             - MCV persistently low. Given Feraheme 11/21 - Dr. Henrene Pastor has seen recommend outpatient f/u - Eliquis restarted. Hgb trending back up, 8.2 today  - continue to follow  16. Debility - need to mobilize   Length of Stay: [address] [name]  07/06/2020, 7:54 AM  Advanced Heart Failure Team Pager [phone] (M-F; 7a - 4p)  Please contact [facility] for night-coverage after hours (4p -7a ) and weekends on [email or web address]  Patient seen with PA, agree with the above note.   Still with left lower leg pain but significantly decreased erythema.  Creatinine up to 3.37.  Got feraheme yesterday, hgb up to 8.2.    General: NAD Neck: Thick, JVP difficult, no thyromegaly or thyroid nodule.  Lungs: Decreased at bases.  CV: Nondisplaced PMI.  Heart regular S1/S2, no S3/S4, no murmur.  No peripheral edema.   Abdomen: Soft, nontender, no hepatosplenomegaly, no distention.  Skin: Intact without lesions or rashes. Erythema resolved LLE.    Neurologic: Alert and oriented x 3.  Psych: Normal affect. Extremities: No clubbing or cyanosis. S/p left TMA.  HEENT: Normal.   Acute on chronic diastolic CHF, diuresed with IV Lasix initially.  Creatinine higher at 3.37 today. Exam difficult for volume.  - Hold torsemide today.  - Plan for RHC to better assess volume status, will do today.  Discussed risks/benefits with patient and she agrees.   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    H/o Fe deficiency anemia, GI working up as outpatient but has not had scopes yet.  Hgb down to 7.4 here.  Has been FOBT positive.  Seen by GI, not thought to have significant active bleeding, and they elected to defer inpatient endoscopy.  She has had feraheme, hgb up to 8.2.  - Restarted Eliquis (hold am dose today for RHC).    Concern for LLE cellulitis, has ulcerations LLE.  Erythema LLE much improved with IV vancomycin.  Getting qod currently.   - Would continue vancomycin for now.     - With ulcerations, will also check peripheral arterial dopplers to look for worsening of her baseline PAD => still pending.   She is in an accerated junctional rhythm (stable in 70s).  Has been seen in past.  Continue amiodarone for now.   [name] 07/06/2020 8:41 AM

## 2020-07-07 ENCOUNTER — Inpatient Hospital Stay (HOSPITAL_COMMUNITY): Payer: Medicare Other

## 2020-07-07 ENCOUNTER — Ambulatory Visit: Payer: Medicare Other | Admitting: Nurse Practitioner

## 2020-07-07 DIAGNOSIS — N179 Acute kidney failure, unspecified: Secondary | ICD-10-CM

## 2020-07-07 DIAGNOSIS — I5031 Acute diastolic (congestive) heart failure: Secondary | ICD-10-CM

## 2020-07-07 DIAGNOSIS — L03116 Cellulitis of left lower limb: Secondary | ICD-10-CM | POA: Diagnosis not present

## 2020-07-07 DIAGNOSIS — I739 Peripheral vascular disease, unspecified: Secondary | ICD-10-CM

## 2020-07-07 DIAGNOSIS — I10 Essential (primary) hypertension: Secondary | ICD-10-CM | POA: Diagnosis not present

## 2020-07-07 DIAGNOSIS — I5033 Acute on chronic diastolic (congestive) heart failure: Secondary | ICD-10-CM | POA: Diagnosis not present

## 2020-07-07 LAB — CULTURE, BLOOD (ROUTINE X 2)
Culture: NO GROWTH
Culture: NO GROWTH
Special Requests: ADEQUATE
Special Requests: ADEQUATE

## 2020-07-07 LAB — ECHOCARDIOGRAM COMPLETE
AR max vel: 1.08 cm2
AV Area VTI: 1.15 cm2
AV Area mean vel: 1.02 cm2
AV Mean grad: 23 mmHg
AV Peak grad: 36.7 mmHg
Ao pk vel: 3.03 m/s
Area-P 1/2: 3.03 cm2
Height: 66 in
P 1/2 time: 74 msec
S' Lateral: 2.9 cm
Weight: 3269.86 oz

## 2020-07-07 LAB — GLUCOSE, CAPILLARY
Glucose-Capillary: 207 mg/dL — ABNORMAL HIGH (ref 70–99)
Glucose-Capillary: 280 mg/dL — ABNORMAL HIGH (ref 70–99)
Glucose-Capillary: 198 mg/dL — ABNORMAL HIGH (ref 70–99)
Glucose-Capillary: 267 mg/dL — ABNORMAL HIGH (ref 70–99)
Glucose-Capillary: 214 mg/dL — ABNORMAL HIGH (ref 70–99)
Glucose-Capillary: 207 mg/dL — ABNORMAL HIGH (ref 70–99)

## 2020-07-07 LAB — CBC
HCT: 27.4 % — ABNORMAL LOW (ref 36.0–46.0)
Hemoglobin: 7.8 g/dL — ABNORMAL LOW (ref 12.0–15.0)
MCH: 21 pg — ABNORMAL LOW (ref 26.0–34.0)
MCHC: 28.5 g/dL — ABNORMAL LOW (ref 30.0–36.0)
MCV: 73.9 fL — ABNORMAL LOW (ref 80.0–100.0)
Platelets: 342 10*3/uL (ref 150–400)
RBC: 3.71 MIL/uL — ABNORMAL LOW (ref 3.87–5.11)
RDW: 23.9 % — ABNORMAL HIGH (ref 11.5–15.5)
WBC: 8.9 10*3/uL (ref 4.0–10.5)
nRBC: 1 % — ABNORMAL HIGH (ref 0.0–0.2)

## 2020-07-07 LAB — BASIC METABOLIC PANEL
Anion gap: 13 (ref 5–15)
BUN: 61 mg/dL — ABNORMAL HIGH (ref 8–23)
CO2: 25 mmol/L (ref 22–32)
Calcium: 8.3 mg/dL — ABNORMAL LOW (ref 8.9–10.3)
Chloride: 97 mmol/L — ABNORMAL LOW (ref 98–111)
Creatinine, Ser: 3.72 mg/dL — ABNORMAL HIGH (ref 0.44–1.00)
GFR, Estimated: 13 mL/min — ABNORMAL LOW (ref >60)
Glucose, Bld: 240 mg/dL — ABNORMAL HIGH (ref 70–99)
Potassium: 3.9 mmol/L (ref 3.5–5.1)
Sodium: 135 mmol/L (ref 135–145)

## 2020-07-07 LAB — PREPARE RBC (CROSSMATCH)

## 2020-07-07 MED ORDER — SODIUM CHLORIDE 0.9 % IV SOLN: INTRAVENOUS | Status: DC

## 2020-07-07 MED ORDER — SODIUM CHLORIDE 0.9% IV SOLUTION: Freq: Once | INTRAVENOUS | Status: DC

## 2020-07-07 MED ORDER — INSULIN NPH (HUMAN) (ISOPHANE) 100 UNIT/ML ~~LOC~~ SUSP
55.0000 [IU] | Freq: Two times a day (BID) | SUBCUTANEOUS | Status: DC
  Administered 2020-07-07 (×2): 55 [IU] via SUBCUTANEOUS

## 2020-07-07 NOTE — Progress Notes (Signed)
Physical Therapy Treatment Patient Details Name: [name] MRN: [id number] DOB: 1950/12/31 Today's Date: 07/07/2020    History of Present Illness 69 yo female with onset of acute CHF with EF 55-60% via TEE was noted to potentially have transthyretin amyoloidosis.  Pt is uncontrolled with HTN, has elevation of creatinine, LLE cellulitis with pain.  PMHx:  anemia, HLD, mitral valve disease and enterococcal endocarditis, PAD, CHF,     PT Comments    Pt supine in bed on arrival in R side  Lying.  Pt required encouragement to move into sitting and OOB to commode and back to bed.  Once back to bed placed in chair position and encouraged patient to sit upright x 1 hour.  Pt to d/c home with support from spouse.      Follow Up Recommendations  Home health PT;Supervision for mobility/OOB     Equipment Recommendations  None recommended by PT    Recommendations for Other Services       Precautions / Restrictions Precautions Precautions: Fall Precaution Comments: fatigues quickly. Restrictions Weight Bearing Restrictions: No LLE Weight Bearing: Weight bearing as tolerated    Mobility  Bed Mobility Overal bed mobility: Needs Assistance Bed Mobility: Supine to Sit;Sit to Supine Rolling: Min assist   Supine to sit: Min assist     General bed mobility comments: Min A for rolling trunk and trunk elevation; assist for BLEs back in bed, +2 max to boost to [facility].  Transfers Overall transfer level: Needs assistance Equipment used: Rolling walker (2 wheeled) Transfers: Sit to/from Stand Sit to Stand: Mod assist         General transfer comment: Cues for hand placement.  Presents with poor eccentric load to seated surface.  Ambulation/Gait Ambulation/Gait assistance: Mod assist Gait Distance (Feet): 4 Feet (x2 ( to and from commode )) Assistive device: Rolling walker (2 wheeled) Gait Pattern/deviations: Trunk flexed;Step-to pattern;Shuffle;Decreased stride length      General Gait Details: Pt with flexed posture and poor safety with device.  Cues to keep device close to maintain safety.   Stairs             Wheelchair Mobility    Modified Rankin (Stroke Patients Only)       Balance Overall balance assessment: Needs assistance Sitting-balance support: Feet supported Sitting balance-Leahy Scale: Good       Standing balance-Leahy Scale: Poor                              Cognition Arousal/Alertness: Awake/alert Behavior During Therapy: WFL for tasks assessed/performed Overall Cognitive Status: Within Functional Limits for tasks assessed                                        Exercises      General Comments        Pertinent Vitals/Pain Pain Assessment: Faces Faces Pain Scale: Hurts even more Pain Location: back, LLE Pain Descriptors / Indicators: Discomfort Pain Intervention(s): Monitored during session;Repositioned    Home Living                      Prior Function            PT Goals (current goals can now be found in the care plan section) Acute Rehab PT Goals Patient Stated Goal: less pain Potential to Achieve Goals:  Fair Progress towards PT goals: Progressing toward goals    Frequency    Min 3X/week      PT Plan Current plan remains appropriate    Co-evaluation              AM-PAC PT "6 Clicks" Mobility   Outcome Measure  Help needed turning from your back to your side while in a flat bed without using bedrails?: A Little Help needed moving from lying on your back to sitting on the side of a flat bed without using bedrails?: A Little Help needed moving to and from a bed to a chair (including a wheelchair)?: A Lot Help needed standing up from a chair using your arms (e.g., wheelchair or bedside chair)?: A Lot Help needed to walk in hospital room?: A Lot Help needed climbing 3-5 steps with a railing? : Total 6 Click Score: 13    End of Session Equipment  Utilized During Treatment: Gait belt Activity Tolerance: Patient tolerated treatment well Patient left: in bed;with call bell/phone within reach;with bed alarm set (encouraged pt to sit in modified chair position to improve breathing) Nurse Communication: Mobility status PT Visit Diagnosis: Unsteadiness on feet (R26.81);Muscle weakness (generalized) (M62.81);Difficulty in walking, not elsewhere classified (R26.2);Pain Pain - Right/Left: Left Pain - part of body: Leg     Time: 1455-1515 PT Time Calculation (min) (ACUTE ONLY): 20 min  Charges:  $Gait Training: 8-22 mins                     Erasmo Leventhal , PTA Acute Rehabilitation Services Pager [phone] Office [phone]     Oren Barella Eli Hose 07/07/2020, 3:25 PM

## 2020-07-07 NOTE — Progress Notes (Signed)
Patient ID: [name], female   DOB: 12/24/1950, 69 y.o.   MRN: [id number]  PROGRESS NOTE    [name]  FGH:[id number] DOB: 1951-07-05 DOA: 07/02/2020 PCP: [name], DO   Brief Narrative:  69 y.o. female with h/o DM; OSA; AS s/p TAVR; obesity s/p gastric sleeve; CAD; HTN; HLD; stage IV CKD; chronic diastolic CHF; and afib on Eliquis was admitted on 07/02/2020 from CHF clinic for acute on chronic diastolic CHF and possible left lower extremity cellulitis.  TRH was consulted for possible left lower extremity cellulitis, failed outpatient treatment with Keflex.  Assessment & Plan:   Left lower extremity stasis dermatitis with ulcerations with?  Cellulitis Peripheral arterial disease -She has been treated with antibiotics x3 as an outpatient.  Left lower extremity is slightly erythematous but unclear if this is a bacterial infection versus fluid overload from CHF.  Treated with few doses of IV Vanco for primary heart failure team.  Vancomycin stopped today  -Wound care consultation appreciated -Lower extremity duplex ultrasound on 06/18/2020 was negative for DVT -Lower extremity arterial Doppler showed occluded left popliteal artery, distally PT and AT appear occluded.  Vascular surgery has been consulted by primary team.  Acute on chronic diastolic CHF  hypertension Paroxysmal A. Fib Hyperlipidemia -Status post right heart catheterization on 07/06/2020 by primary team -Being primarily managed by heart failure team.  Diuretics being held by primary team today.  Also on hydralazine and amiodarone.  Also on tafamidis for amyloidosis.  Also on TriCor, Vascepa  Diabetes mellitus type II with hyperglycemia and hypoglycemia -NPH doses were decreased on 07/05/2020. Subsequently blood sugars are on the higher side again. Will increase the dose of NPH. Continue CBGs with SSI.    AKI on CKD stage IV -Baseline creatinine around 2.8.  Creatinine 3.72 today.  Consider nephrology  evaluation if creatinine continues to rise  Anemia of chronic disease -From CKD stage IV.  Hemoglobin 7.8 today.  Recommend transfusion if hemoglobin less than 7.  GI recommends outpatient GI follow-up and to continue Protonix twice a day and chronic iron therapy daily.  Obesity -Outpatient follow-up   Subjective: Patient seen and examined at bedside.  Poor historian.  Denies worsening left lower extremity pain.  No overnight fever or vomiting noted.  Still short of breath with exertion.   Objective: Vitals:   07/06/20 1110 07/06/20 1547 07/06/20 2116 07/07/20 0410  BP: (!) 111/53 133/66 (!) 131/56 (!) 132/55  Pulse: 74 72 76 81  Resp:  18  20  Temp:  98.2 F (36.8 C) 98 F (36.7 C) 98 F (36.7 C)  TempSrc:  Oral Oral Oral  SpO2: 94% 99% 99% 98%  Weight:    92.7 kg  Height:        Intake/Output Summary (Last 24 hours) at 07/07/2020 0819 Last data filed at 07/07/2020 0300 Gross per 24 hour  Intake 355 ml  Output 750 ml  Net -395 ml   Filed Weights   07/05/20 0425 07/06/20 0322 07/07/20 0410  Weight: 89.3 kg 89.8 kg 92.7 kg    Examination:  General exam: No acute distress. Respiratory system: Bilateral decreased breath sounds at bases with basilar crackles  cardiovascular system: S1-S2 heard, rate controlled gastrointestinal system: Abdomen is obese, nondistended, soft and nontender.  Normal bowel sounds are Extremities: No clubbing. Left lower extremity edema present with chronic changes with some tenderness which is improving; dressing present; left TMA present   Data Reviewed: I have personally reviewed following labs and  imaging studies  CBC: Recent Labs  Lab 07/02/20 1628 07/02/20 1628 07/03/20 0248 07/03/20 0248 07/04/20 0258 07/04/20 0258 07/05/20 0258 07/05/20 0258 07/06/20 0320 07/06/20 1003 07/06/20 1004 07/06/20 1007 07/07/20 0204  WBC 7.2   < > 8.3  --  8.4  --  10.5  --  8.7  --   --   --  8.9  NEUTROABS 6.1  --   --   --   --   --   --    --   --   --   --   --   --   HGB 7.9*   < > 7.4*   < > 7.7*   < > 7.9*   < > 8.2* 9.9* 9.9* 9.5* 7.8*  HCT 27.6*   < > 26.1*   < > 27.2*   < > 27.5*   < > 28.3* 29.0* 29.0* 28.0* 27.4*  MCV 73.4*   < > 73.3*  --  72.9*  --  73.7*  --  73.9*  --   --   --  73.9*  PLT 476*   < > 414*  --  409*  --  407*  --  374  --   --   --  342   < > = values in this interval not displayed.   Basic Metabolic Panel: Recent Labs  Lab 07/02/20 1628 07/02/20 1628 07/03/20 0248 07/03/20 0248 07/04/20 0258 07/04/20 0258 07/05/20 0258 07/05/20 0258 07/06/20 0320 07/06/20 1003 07/06/20 1004 07/06/20 1007 07/07/20 0204  NA 136   < > 137   < > 137   < > 137   < > 135 138 138 139 135  K 3.9   < > 3.8   < > 3.7   < > 3.8   < > 4.1 3.9 3.9 3.9 3.9  CL 97*   < > 98  --  98  --  100  --  98  --   --   --  97*  CO2 27   < > 28  --  28  --  28  --  23  --   --   --  25  GLUCOSE 373*   < > 185*  --  84  --  124*  --  188*  --   --   --  240*  BUN 58*   < > 61*  --  55*  --  51*  --  58*  --   --   --  61*  CREATININE 3.54*   < > 3.49*  --  3.03*  --  3.12*  --  3.37*  --   --   --  3.72*  CALCIUM 8.7*   < > 8.4*  --  8.5*  --  8.3*  --  8.4*  --   --   --  8.3*  MG 2.2  --   --   --   --   --   --   --   --   --   --   --   --    < > = values in this interval not displayed.   GFR: Estimated Creatinine Clearance: 16.4 mL/min (A) (by C-G formula based on SCr of 3.72 mg/dL (H)). Liver Function Tests: Recent Labs  Lab 07/02/20 1628  AST 49*  ALT 36  ALKPHOS 45  BILITOT 1.1  PROT 6.3*  ALBUMIN 2.7*   No results for input(s):  LIPASE, AMYLASE in the last 168 hours. No results for input(s): AMMONIA in the last 168 hours. Coagulation Profile: No results for input(s): INR, PROTIME in the last 168 hours. Cardiac Enzymes: No results for input(s): CKTOTAL, CKMB, CKMBINDEX, TROPONINI in the last 168 hours. BNP (last 3 results) No results for input(s): PROBNP in the last 8760 hours. HbA1C: No results for  input(s): HGBA1C in the last 72 hours. CBG: Recent Labs  Lab 07/06/20 1542 07/06/20 2116 07/07/20 0019 07/07/20 0410 07/07/20 0729  GLUCAP 267* 280* 280* 207* 198*   Lipid Profile: No results for input(s): CHOL, HDL, LDLCALC, TRIG, CHOLHDL, LDLDIRECT in the last 72 hours. Thyroid Function Tests: No results for input(s): TSH, T4TOTAL, FREET4, T3FREE, THYROIDAB in the last 72 hours. Anemia Panel: No results for input(s): VITAMINB12, FOLATE, FERRITIN, TIBC, IRON, RETICCTPCT in the last 72 hours. Sepsis Labs: No results for input(s): PROCALCITON, LATICACIDVEN in the last 168 hours.  Recent Results (from the past 240 hour(s))  Culture, blood (routine x 2)     Status: None   Collection Time: 07/02/20  4:28 PM   Specimen: BLOOD  Result Value Ref Range Status   Specimen Description BLOOD RIGHT ANTECUBITAL  Final   Special Requests   Final    BOTTLES DRAWN AEROBIC AND ANAEROBIC Blood Culture adequate volume   Culture   Final    NO GROWTH 5 DAYS Performed at [facility], [address] [place], [place] [address]    Report Status 07/07/2020 FINAL  Final  Culture, blood (routine x 2)     Status: None   Collection Time: 07/02/20  4:31 PM   Specimen: BLOOD  Result Value Ref Range Status   Specimen Description BLOOD LEFT ANTECUBITAL  Final   Special Requests   Final    BOTTLES DRAWN AEROBIC AND ANAEROBIC Blood Culture adequate volume   Culture   Final    NO GROWTH 5 DAYS Performed at [facility], [place] [address], [place], [place] [address]    Report Status 07/07/2020 FINAL  Final  Respiratory Panel by RT PCR (Flu A&B, Covid) - Nasopharyngeal Swab     Status: None   Collection Time: 07/02/20  4:37 PM   Specimen: Nasopharyngeal Swab; Nasopharyngeal(NP) swabs in vial transport medium  Result Value Ref Range Status   SARS Coronavirus 2 by RT PCR NEGATIVE NEGATIVE Final    Comment: (NOTE) SARS-CoV-2 target nucleic acids are NOT DETECTED.  The SARS-CoV-2 RNA is generally  detectable in upper respiratoy specimens during the acute phase of infection. The lowest concentration of SARS-CoV-2 viral copies this assay can detect is 131 copies/mL. A negative result does not preclude SARS-Cov-2 infection and should not be used as the sole basis for treatment or other patient management decisions. A negative result may occur with  improper specimen collection/handling, submission of specimen other than nasopharyngeal swab, presence of viral mutation(s) within the areas targeted by this assay, and inadequate number of viral copies (<131 copies/mL). A negative result must be combined with clinical observations, patient history, and epidemiological information. The expected result is Negative.  Fact Sheet for Patients:  PinkCheek.be  Fact Sheet for Healthcare Providers:  GravelBags.it  This test is no t yet approved or cleared by the [place] FDA and  has been authorized for detection and/or diagnosis of SARS-CoV-2 by FDA under an Emergency Use Authorization (EUA). This EUA will remain  in effect (meaning this test can be used) for the duration of the COVID-19 declaration under  Section 564(b)(1) of the Act, 21 U.S.C. section 360bbb-3(b)(1), unless the authorization is terminated or revoked sooner.     Influenza A by PCR NEGATIVE NEGATIVE Final   Influenza B by PCR NEGATIVE NEGATIVE Final    Comment: (NOTE) The Xpert Xpress SARS-CoV-2/FLU/RSV assay is intended as an aid in  the diagnosis of influenza from Nasopharyngeal swab specimens and  should not be used as a sole basis for treatment. Nasal washings and  aspirates are unacceptable for Xpert Xpress SARS-CoV-2/FLU/RSV  testing.  Fact Sheet for Patients: PinkCheek.be  Fact Sheet for Healthcare Providers: GravelBags.it  This test is not yet approved or cleared by the [place] FDA and    has been authorized for detection and/or diagnosis of SARS-CoV-2 by  FDA under an Emergency Use Authorization (EUA). This EUA will remain  in effect (meaning this test can be used) for the duration of the  Covid-19 declaration under Section 564(b)(1) of the Act, 21  U.S.C. section 360bbb-3(b)(1), unless the authorization is  terminated or revoked. Performed at [facility], [place] [address] [place], [place] [address]          Radiology Studies: CARDIAC CATHETERIZATION  Result Date: 07/06/2020 1. Elevated right and left heart filling pressures. 2. Pulmonary venous hypertension.   VAS [place] LOWER EXTREMITY ARTERIAL DUPLEX  Result Date: 07/06/2020 LOWER EXTREMITY ARTERIAL DUPLEX STUDY Indications: Rest pain, peripheral artery disease, and Cellulitis. High Risk Factors: Hypertension, hyperlipidemia. Other Factors: TAVR.  Vascular Interventions: . left distal SFA-TP trunk bypass and left TMA in 2/21. Current ABI:            ABI done 04/23/20 at outside facility Limitations: patient unable to turn supine secondary to pain in leg. Constant              left lower extremity spasms Comparison Study: Prior ABI done 04/23/20 Performing Technologist: Sharion Dove RVS  Examination Guidelines: A complete evaluation includes B-mode imaging, spectral Doppler, color Doppler, and power Doppler as needed of all accessible portions of each vessel. Bilateral testing is considered an integral part of a complete examination. Limited examinations for reoccurring indications may be performed as noted.  +----------+--------+-----+--------+-----------+---------------+ LEFT      PSV cm/sRatioStenosisWaveform   Comments        +----------+--------+-----+--------+-----------+---------------+ CFA Distal105                  multiphasic                +----------+--------+-----+--------+-----------+---------------+ DFA       35                                               +----------+--------+-----+--------+-----------+---------------+ SFA Prox  80                                              +----------+--------+-----+--------+-----------+---------------+ SFA Mid   86                                              +----------+--------+-----+--------+-----------+---------------+ SFA Distal31  thumping signal +----------+--------+-----+--------+-----------+---------------+ POP Prox  9            occluded                           +----------+--------+-----+--------+-----------+---------------+ ATA Distal             occluded                           +----------+--------+-----+--------+-----------+---------------+ PTA Distal             occluded                           +----------+--------+-----+--------+-----------+---------------+  Summary: Left: Limited study. Possible collateral noted at distal femoral artery. Popliteal artery appears occluded. Distal PT and AT appear occluded. Unable to visualize bypass graft.  See table(s) above for measurements and observations. Electronically signed by Monica Martinez MD on 07/06/2020 at 4:59:12 PM.    Final         Scheduled Meds: . amiodarone  200 mg Oral Daily  . apixaban  5 mg Oral BID  . docusate sodium  100 mg Oral BID  . fenofibrate  160 mg Oral Daily  . ferrous sulfate  325 mg Oral BID WC  . hydrALAZINE  50 mg Oral TID  . icosapent Ethyl  2 g Oral BID  . insulin aspart  0-5 Units Subcutaneous QHS  . insulin aspart  0-9 Units Subcutaneous TID WC  . insulin NPH Human  20 Units Subcutaneous Q supper  . insulin NPH Human  50 Units Subcutaneous BID WC  . pantoprazole  40 mg Oral BID  . sodium chloride flush  3 mL Intravenous Q12H  . sodium chloride flush  3 mL Intravenous Q12H  . Tafamidis  61 mg Oral Daily   Continuous Infusions: . sodium chloride            Aline August, MD [facility] 07/07/2020, 8:19 AM

## 2020-07-07 NOTE — Progress Notes (Signed)
PT Cancellation Note  Patient Details Name: [name] MRN: [id number] DOB: Jul 29, 1951   Cancelled Treatment:    Reason Eval/Treat Not Completed: (P) Other (comment) (Pt off unit for echo will f/u per POC.)   [name] J Stann Mainland 07/07/2020, 2:13 PM  Erasmo Leventhal , PTA Acute Rehabilitation Services Pager [phone] Office [phone]

## 2020-07-07 NOTE — Evaluation (Signed)
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  Pt is uncontrolled with HTN, has elevation of creatinine, LLE cellulitis with pain.  PMHx:  anemia, HLD, mitral valve disease and enterococcal endocarditis, PAD, CHF,    Clinical Impression   Pt PTA: pt living at home with spouse and requiring assist with LB ADL and set-upA with UB ADL. Pt currently performing stand pivots with modA overall. Pt totalA for toileting. Pt reports that pt will resume assist frm her spouse at home and is currently only transferring at home to [facility] or WC. Pt does not require continued OT skilled services. OT signing off. Thank you for this referral.       Follow Up Recommendations  No OT follow up;Supervision/Assistance - 24 hour    Equipment Recommendations  None recommended by OT    Recommendations for Other Services       Precautions / Restrictions Precautions Precautions: Fall Precaution Comments: vitals as needed Restrictions Weight Bearing Restrictions: No LLE Weight Bearing: Weight bearing as tolerated      Mobility Bed Mobility Overal bed mobility: Needs Assistance Bed Mobility: Supine to Sit;Sit to Supine Rolling: Min assist   Supine to sit: Min assist Sit to supine: Min assist   General bed mobility comments: Min A for rolling trunk and trunk elevation; assist for BLEs back in bed    Transfers Overall transfer level: Needs assistance Equipment used: 1 person hand held assist Transfers: Sit to/from [facility] Sit to Stand: Mod assist Stand pivot transfers: Mod assist       General transfer comment: Pt with pain, but able to perform stand pivot    Balance Overall balance assessment: Needs assistance Sitting-balance support: Feet  supported       Standing balance support: Bilateral upper extremity supported Standing balance-Leahy Scale: Poor                             ADL either performed or assessed with clinical judgement   ADL Overall ADL's : At baseline                                       General ADL Comments: Pt performing transfer to [facility] <-> bed with minA overall and dependent for LB ADL due to large body habitus and poor balance.       Vision Baseline Vision/History: No visual deficits Patient Visual Report: No change from baseline Vision Assessment?: No apparent visual deficits     Perception     Praxis      Pertinent Vitals/Pain Pain Assessment: Faces Faces Pain Scale: Hurts whole lot Pain Location: back, LLE Pain Descriptors / Indicators: Discomfort Pain Intervention(s): Limited activity within patient's tolerance     Hand Dominance Right   Extremity/Trunk Assessment Upper Extremity Assessment Upper Extremity Assessment: Generalized weakness   Lower Extremity Assessment Lower Extremity Assessment: Generalized weakness   Cervical / Trunk Assessment Cervical / Trunk Assessment: Other exceptions Cervical / Trunk Exceptions: large body habitus   Communication Communication Communication: HOH   Cognition Arousal/Alertness: Awake/alert Behavior During Therapy: WFL for tasks assessed/performed Overall Cognitive Status: Within Functional Limits for tasks assessed  General Comments  VSS    Exercises     Shoulder Instructions      Home Living Family/patient expects to be discharged to:: Private residence Living Arrangements: Spouse/significant other Available Help at Discharge: Family Type of Home: House Home Access: Stairs to enter Technical brewer of Steps: 1 Entrance Stairs-Rails: None Home Layout: One level     Bathroom Shower/Tub: Corporate investment banker:  Standard Bathroom Accessibility: Yes   Home Equipment: Walker - standard;Wheelchair - [employer];Tub bench   Additional Comments: Three daughters (two respiratory therapists, one Therapist, sports)      Prior Functioning/Environment Level of Independence: Needs assistance  Gait / Transfers Assistance Needed: Assist for stand pivot transfers recently to [facility] and w/c ADL's / Homemaking Assistance Needed: Pt reports husband assists with ADL and IADL tasks (washes back and manages water for her, helps with socks, shoes, and starting underwear/pants)            OT Problem List: Decreased activity tolerance      OT Treatment/Interventions:      OT Goals(Current goals can be found in the care plan section) Acute Rehab OT Goals Patient Stated Goal: less pain OT Goal Formulation: All assessment and education complete, DC therapy Potential to Achieve Goals: Good  OT Frequency:     Barriers to D/C:            Co-evaluation              AM-PAC OT "6 Clicks" Daily Activity     Outcome Measure Help from another person eating meals?: None Help from another person taking care of personal grooming?: A Little Help from another person toileting, which includes using toliet, bedpan, or urinal?: A Lot Help from another person bathing (including washing, rinsing, drying)?: A Lot Help from another person to put on and taking off regular upper body clothing?: A Lot Help from another person to put on and taking off regular lower body clothing?: A Lot 6 Click Score: 15   End of Session Nurse Communication: Mobility status  Activity Tolerance: Patient tolerated treatment well Patient left: in bed;with call bell/phone within reach  OT Visit Diagnosis: Unsteadiness on feet (R26.81);Muscle weakness (generalized) (M62.81)                Time: 8099-8338 OT Time Calculation (min): 14 min Charges:  OT General Charges $OT Visit: 1 Visit OT Evaluation $OT Eval Moderate Complexity: 1  Mod  Jefferey Pica, OTR/L Acute Rehabilitation Services Pager: [phone] Office: [phone]   Neill Jurewicz C 07/07/2020, 11:22 AM

## 2020-07-07 NOTE — Telephone Encounter (Signed)
Sent in application via fax. [employer] is accepting re-enrollments at this time.   Will follow up.

## 2020-07-07 NOTE — Consult Note (Addendum)
Hospital Consult  VASCULAR SURGERY ASSESSMENT & PLAN:   PERIPHERAL VASCULAR DISEASE: This patient is undergone a previous left lower extremity bypass a year ago which is now occluded.  Based on her history this is a chronic occlusion and not something acute.  Currently she has a superficial eschar on her Achilles area but no wounds on her foot and she has reasonable Doppler flow in the left foot.  She was scheduled to see her vascular surgeon at [facility] actually today.  She knows to follow-up with them once she is discharged.  However since they have been heavily involved with her care I do not see any acute indication for arteriography or further work-up.  Vascular surgery will be available as needed.  Deitra Mayo, MD Office: [phone]   Reason for Consult:  PAD Requesting Physician:  Dr. Aundra Dubin MRN #:  [id number]  History of Present Illness: This is a 69 y.o. female with past medical history significant for diabetes mellitus, morbid obesity, CAD, hypertension, hyperlipidemia, paroxysmal atrial fibrillation, CKD, and CHF admitted to the hospital due to fluid overload.  Patient was also noted to have cellulitis involving a wound of her left distal shin as well as a wound overlying her Achilles tendon.  Surgical history significant for left SFA to TP trunk bypass with CryoVein performed at [facility] earlier this year.  She has also had a left TMA earlier this year which has since healed.  Work-up has included an arterial duplex suggesting chronic occlusion of her SFA to TP trunk bypass.  She was treated for cellulitis with broad-spectrum antibiotics which have been discontinued today due to improvement of left lower extremity wounds.  She has also been diuresed however renal labs are worsening.  She has tenderness overlying Achilles wound however denies any pain in her foot.  Patient believes she is scheduled to follow-up with her vascular surgeon in the next few weeks however cannot remember the  exact date.  Past Medical History:  Diagnosis Date  . Atrial fibrillation ([place])    a. maintaining sinus s/p DCCV, on Eliquis  . CAD (coronary artery disease)    a. Nonobstructive by cath 2006 - 25% LAD, 25-30% after 1st diag, distal 25% PDA, minor irreg LCx. b. Normal nuc 2011.  Marland Kitchen CHF (congestive heart failure) ([place])   . CKD (chronic kidney disease), stage III ([place])   . Cystocele   . Difficult intubation 09-07-2012   big neck trouble with intubation parotid surgery"takes little med to sedate"  . Esophageal stricture   . GERD (gastroesophageal reflux disease)   . Hiatal hernia    a. 07/2013: HH with small stricture holding up barium tablet (concides with patient's sx of food sticking).  . History of kidney stones   . Hyperlipidemia   . Hypertension   . IBS (irritable bowel syndrome)   . Memory difficulty 11/26/2015  . Morbid obesity ([place])    a. hx of gastric bypass (sleeve gastrectomy 2015)  . Non-STEMI (non-ST elevated myocardial infarction) ([place]) 01/16/2020  . Osteoporosis   . 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  Marland Kitchen Pericardial effusion    a. Mod by echo 2012 at time of PNA. b. Echo 07/2012: small pericardial effusion vs fat.  . Pressure ulcer 04/16/2019   right foot  . S/P TAVR (transcatheter aortic valve replacement)   . Severe aortic stenosis   . Sleep apnea     "have mask; don't use it" (02/14/2018)  . TIA (transient ischemic attack) 10/2014;  06/2016   "some memory issues since; daughter says my speech is sometimes different" (02/14/2018)  . Type II diabetes mellitus ([place])     Past Surgical History:  Procedure Laterality Date  . BREATH TEK H PYLORI N/A 07/29/2013   Procedure: [place];  Surgeon: Shann Medal, MD;  Location: [facility];  Service: General;  Laterality: N/A;  . CARDIAC CATHETERIZATION  2006  . CARDIOVERSION N/A 02/16/2018   Procedure: CARDIOVERSION;  Surgeon: Pixie Casino, MD;  Location: [facility] ENDOSCOPY;  Service:  Cardiovascular;  Laterality: N/A;  . CARDIOVERSION N/A 02/25/2020   Procedure: CARDIOVERSION;  Surgeon: Larey Dresser, MD;  Location: [place];  Service: Cardiovascular;  Laterality: N/A;  . CARPAL TUNNEL RELEASE Left 2011  . CATARACT EXTRACTION W/ INTRAOCULAR LENS  IMPLANT, BILATERAL Bilateral 2003   left  . CHOLECYSTECTOMY OPEN  1982  . COLONOSCOPY N/A 07/14/2015   Procedure: COLONOSCOPY;  Surgeon: Ladene Artist, MD;  Location: [facility];  Service: Endoscopy;  Laterality: N/A;  . ESOPHAGOGASTRODUODENOSCOPY N/A 12/27/2013   Procedure: ESOPHAGOGASTRODUODENOSCOPY (EGD);  Surgeon: Shann Medal, MD;  Location: [facility];  Service: General;  Laterality: N/A;  . ESOPHAGOGASTRODUODENOSCOPY (EGD) WITH ESOPHAGEAL DILATION    . INTRAOPERATIVE TRANSTHORACIC ECHOCARDIOGRAM  05/08/2018   Procedure: INTRAOPERATIVE TRANSTHORACIC ECHOCARDIOGRAM;  Surgeon: Burnell Blanks, MD;  Location: [place];  Service: Open Heart Surgery;;  . IR FLUORO GUIDE CV LINE RIGHT  04/25/2019  . IR REMOVAL TUN CV CATH W/O FL  06/20/2019  . IR US GUIDE VASC ACCESS RIGHT  04/25/2019  . LAPAROSCOPIC GASTRIC SLEEVE RESECTION N/A 01/20/2014   Procedure: LAPAROSCOPIC GASTRIC SLEEVE RESECTION;  Surgeon: Shann Medal, MD;  Location: [facility];  Service: General;  Laterality: N/A;  . PAROTIDECTOMY  09/07/2012   Procedure: PAROTIDECTOMY;  Surgeon: Melida Quitter, MD;  Location: [place];  Service: ENT;  Laterality: Left;  LEFT PAROTIDECTOMY  . RIGHT HEART CATH N/A 09/25/2018   Procedure: RIGHT HEART CATH;  Surgeon: Larey Dresser, MD;  Location: [place] CV [facility];  Service: Cardiovascular;  Laterality: N/A;  . RIGHT HEART CATH N/A 07/06/2020   Procedure: RIGHT HEART CATH;  Surgeon: Larey Dresser, MD;  Location: [place] CV LAB;  Service: Cardiovascular;  Laterality: N/A;  . RIGHT/LEFT HEART CATH AND CORONARY ANGIOGRAPHY N/A 03/29/2018   Procedure: RIGHT/LEFT HEART CATH AND CORONARY ANGIOGRAPHY;  Surgeon: Burnell Blanks, MD;  Location: [place] CV [facility];  Service: Cardiovascular;  Laterality: N/A;  . TEE WITHOUT CARDIOVERSION N/A 02/16/2018   Procedure: TRANSESOPHAGEAL ECHOCARDIOGRAM (TEE);  Surgeon: Pixie Casino, MD;  Location: [place];  Service: Cardiovascular;  Laterality: N/A;  . TEE WITHOUT CARDIOVERSION N/A 06/05/2019   Procedure: TRANSESOPHAGEAL ECHOCARDIOGRAM (TEE);  Surgeon: Sanda Klein, MD;  Location: [place];  Service: Cardiovascular;  Laterality: N/A;  . TEE WITHOUT CARDIOVERSION N/A 04/24/2019   Procedure: TRANSESOPHAGEAL ECHOCARDIOGRAM (TEE);  Surgeon: Sanda Klein, MD;  Location: [place];  Service: Cardiovascular;  Laterality: N/A;  . TEE WITHOUT CARDIOVERSION N/A 02/25/2020   Procedure: TRANSESOPHAGEAL ECHOCARDIOGRAM (TEE);  Surgeon: Larey Dresser, MD;  Location: [facility] ENDOSCOPY;  Service: Cardiovascular;  Laterality: N/A;  . TOE AMPUTATION Left 07/2017   great toe; [facility]   . TONSILLECTOMY  1968  . TRANSCATHETER AORTIC VALVE REPLACEMENT, TRANSFEMORAL  05/08/2018   Transcatheter Aortic Valve Replacement   . TRANSCATHETER AORTIC VALVE REPLACEMENT, TRANSFEMORAL N/A 05/08/2018   Procedure: TRANSCATHETER AORTIC VALVE REPLACEMENT, TRANSFEMORAL;  Surgeon: Burnell Blanks, MD;  Location: [place];  Service: Open Heart Surgery;  Laterality: N/A;  . TUBAL LIGATION  yrs ago  . UPPER GI ENDOSCOPY  01/20/2014   Procedure: UPPER GI ENDOSCOPY;  Surgeon: Shann Medal, MD;  Location: [facility];  Service: General;;    Allergies  Allergen Reactions  . Amlodipine Swelling    Ankle swelling  . Atorvastatin Other (See Comments)    Myalgias   . Crestor [Rosuvastatin Calcium] Other (See Comments)    Stiffness and back pain  . Ezetimibe-Simvastatin Other (See Comments)    Leg cramps  . Sitagliptin Other (See Comments)  . Statins Other (See Comments)    Myalgias  . Celebrex [Celecoxib] Rash  . Levemir [Insulin Detemir] Rash    Prior to Admission medications     Medication Sig Start Date End Date Taking? Authorizing Provider  ACCU-CHEK AVIVA PLUS test strip USE TO CHECK BLOOD SUGAR UP TO TWICE DAILY OR AS INSTRUCTED Patient taking differently: 1 each by Other route 2 (two) times daily.  09/04/17  Yes Chipper Herb, MD  amiodarone (PACERONE) 200 MG tablet Take 200 mg by mouth daily.   Yes [provider]  B-D INS SYR ULTRAFINE 1CC/31G 31G X 5/16" 1 ML MISC USE TO INJECT INSULIN TWICE A DAY AS INSTRUCTED Patient taking differently: 1 Stick by Other route 3 (three) times daily before meals.  11/16/15  Yes Chipper Herb, MD  benzonatate (TESSALON PERLES) 100 MG capsule Take 1 capsule (100 mg total) by mouth 3 (three) times daily as needed. 06/10/20  Yes Ronnie Doss M, DO  Calcium Carbonate (CALCIUM 600 PO) Take 1 tablet by mouth daily.   Yes [provider]  Cholecalciferol (VITAMIN D3 PO) Take 1 tablet by mouth daily.    Yes [provider]  Cyanocobalamin (VITAMIN B-12 PO) Take 1 tablet by mouth daily.    Yes [provider]  docusate sodium (COLACE) 50 MG capsule Take 2 capsules (100 mg total) by mouth 2 (two) times daily. Patient taking differently: Take 100 mg by mouth daily.  03/23/20  Yes Larey Dresser, MD  ELIQUIS 5 MG TABS tablet TAKE 1 TABLET BY MOUTH TWICE A DAY Patient taking differently: Take 5 mg by mouth 2 (two) times daily.  02/03/20  Yes Minus Breeding, MD  fenofibrate (TRICOR) 145 MG tablet TAKE 1 TABLET BY MOUTH EVERY DAY Patient taking differently: Take 145 mg by mouth daily.  12/16/19  Yes Larey Dresser, MD  ferrous sulfate (FERROUSUL) 325 (65 FE) MG tablet Take 1 tablet (325 mg total) by mouth 2 (two) times daily with a meal. 06/10/20  Yes Cirigliano, Vito V, DO  hydrALAZINE (APRESOLINE) 50 MG tablet Take 50 mg by mouth 3 (three) times daily.   Yes [provider]  insulin NPH Human (NOVOLIN N) 100 UNIT/ML injection Inject 20-60 Units into the skin 2 (two) times daily before a meal.  60 units in the am 60 units at lunch and 20 units for supper - on a sliding scale   Yes [provider]  Insulin Pen Needle 32G X 4 MM MISC Use to inject insulin with insulin pen 05/17/16  Yes Chipper Herb, MD  loratadine (CLARITIN) 10 MG tablet Take 10 mg by mouth daily.    Yes [provider]  meclizine (ANTIVERT) 12.5 MG tablet Take 1 tablet (12.5 mg total) by mouth 3 (three) times daily as needed for dizziness. 06/14/19  Yes Larey Dresser, MD  Multiple Vitamins-Minerals (MULTIVITAMIN GUMMIES ADULT PO) Taking two  gummies by mouth daily   Yes [provider]  NOVOLIN R RELION 100 UNIT/ML injection Inject 20-30 Units into the skin 3 (three) times daily before meals. Per sliding scale 02/07/20  Yes [provider]  nutrition supplement, JUVEN, (JUVEN) PACK Take 1 packet by mouth 2 (two) times daily between meals. 04/26/19  Yes Swayze, Ava, DO  pantoprazole (PROTONIX) 40 MG tablet Take 1 tablet (40 mg total) by mouth daily. 05/19/20  Yes Larey Dresser, MD  Pitavastatin Calcium (LIVALO) 4 MG TABS Take 4 mg by mouth daily.    Yes [provider]  PRALUENT 75 MG/ML SOAJ INJECT 75 MG INTO THE SKIN EVERY 14 (FOURTEEN) DAYS. 11/18/19  Yes Larey Dresser, MD  RELION INSULIN SYRINGE 31G X 15/64" 1 ML MISC Inject 1 Syringe into the skin 3 (three) times daily. 01/04/20  Yes [provider]  sulfamethoxazole-trimethoprim (BACTRIM) 400-80 MG tablet Take 1 tablet by mouth daily. (renally dosed) 06/26/20  Yes Gottschalk, Ashly M, DO  Tafamidis (VYNDAMAX) 61 MG CAPS Take 61 mg by mouth daily. 07/29/19  Yes Larey Dresser, MD  torsemide (DEMADEX) 20 MG tablet Take 1 tablet (20 mg total) by mouth 2 (two) times daily. 05/27/20  Yes Larey Dresser, MD  VASCEPA 1 g capsule TAKE 2 CAPSULES (2 G TOTAL) BY MOUTH 2 (TWO) TIMES A DAY. Patient taking differently: Take 4 g by mouth 2 (two) times daily.  01/16/20  Yes Larey Dresser, MD  guaiFENesin-codeine 100-10  MG/5ML syrup Take 5 mLs by mouth every 6 (six) hours as needed for cough. Patient not taking: Reported on 07/03/2020 06/10/20   [name], DO  nystatin cream (MYCOSTATIN) Apply 1 application topically 2 (two) times daily. x7 days to sacral area Patient not taking: Reported on 07/03/2020 06/10/20   [name], DO  Olmesartan-Amlodipine-HCTZ (TRIBENZOR) 40-5-12.5 MG TABS Take 1 tablet by mouth daily.    04/12/18  [provider]    Social History   Socioeconomic History  . Marital status: Married    Spouse name: Not on file  . Number of children: 3  . Years of education: some college  . Highest education level: Some college, no degree  Occupational History  . Occupation: retired    Fish farm manager: [employer]  Tobacco Use  . Smoking status: Never Smoker  . Smokeless tobacco: Never Used  Vaping Use  . Vaping Use: Never used  Substance and Sexual Activity  . Alcohol use: Not Currently    Alcohol/week: 0.0 standard drinks    Comment:  "mixed drink q few years"  . Drug use: Never  . Sexual activity: Yes    Birth control/protection: Post-menopausal  Other Topics Concern  . Not on file  Social History Narrative   Married   Patient is right handed.   Patient rarely drinks caffeine.   Lives at home with husband in two story home. Husband washes laundry in the basement. They have 3 daughters and 7 grandchildren.    Social Determinants of Health   Financial Resource Strain: Low Risk   . Difficulty of Paying Living Expenses: Not hard at all  Food Insecurity:   . Worried About Charity fundraiser in the Last Year: Not on file  . Ran Out of Food in the Last Year: Not on file  Transportation Needs: No Transportation Needs  . Lack of Transportation (Medical): No  . Lack of Transportation (Non-Medical): No  Physical Activity:   . Days of Exercise  per Week: Not on file  . Minutes of Exercise per Session: Not on file  Stress:   . Feeling of Stress : Not on  file  Social Connections:   . Frequency of Communication with Friends and Family: Not on file  . Frequency of Social Gatherings with Friends and Family: Not on file  . Attends Religious Services: Not on file  . Active Member of Clubs or Organizations: Not on file  . Attends Archivist Meetings: Not on file  . Marital Status: Not on file  Intimate Partner Violence:   . Fear of Current or Ex-Partner: Not on file  . Emotionally Abused: Not on file  . Physically Abused: Not on file  . Sexually Abused: Not on file     Family History  Problem Relation Age of Onset  . Heart failure Mother   . Hypertension Mother   . Skin cancer Mother   . Colitis Mother   . Diabetes Mother   . Kidney Stones Mother   . Stroke Mother   . Heart disease Father   . Colon polyps Father   . Diabetes Father   . Heart failure Father   . Heart attack Sister   . Stroke Maternal Grandfather   . Diabetes Maternal Grandfather   . Diabetes Daughter        prediabetes  . Colon cancer Neg Hx     ROS: Otherwise negative unless mentioned in HPI  Physical Examination  Vitals:   07/06/20 2116 07/07/20 0410  BP: (!) 131/56 (!) 132/55  Pulse: 76 81  Resp:  20  Temp: 98 F (36.7 C) 98 F (36.7 C)  SpO2: 99% 98%   Body mass index is 32.99 kg/m.  General:  WDWN in NAD Gait: Not observed HENT: WNL, normocephalic Pulmonary: normal non-labored breathing, without Rales, rhonchi,  wheezing Cardiac: regular Abdomen:  soft, NT/ND, no masses Skin: without rashes Vascular Exam/Pulses: Brisk left PT by Doppler; soft peroneal signal by Doppler Extremities: Dry eschar left distal shin with no surrounding erythema or drainage; Achilles tendon eschar with low intensity surrounding erythema, no drainage, no exposed tendon Musculoskeletal: no muscle wasting or atrophy  Neurologic: A&O X 3;  No focal weakness or paresthesias are detected; speech is fluent/normal Psychiatric:  The pt has Normal  affect. Lymph:  Unremarkable  CBC    Component Value Date/Time   WBC 8.9 07/07/2020 0204   RBC 3.71 (L) 07/07/2020 0204   HGB 7.8 (L) 07/07/2020 0204   HGB 9.1 (L) 04/21/2020 1620   HCT 27.4 (L) 07/07/2020 0204   HCT 31.6 (L) 04/21/2020 1620   PLT 342 07/07/2020 0204   PLT 452 (H) 04/21/2020 1620   MCV 73.9 (L) 07/07/2020 0204   MCV 75 (L) 04/21/2020 1620   MCH 21.0 (L) 07/07/2020 0204   MCHC 28.5 (L) 07/07/2020 0204   RDW 23.9 (H) 07/07/2020 0204   RDW 15.8 (H) 04/21/2020 1620   LYMPHSABS 0.6 (L) 07/02/2020 1628   LYMPHSABS 1.8 05/30/2019 1010   MONOABS 0.4 07/02/2020 1628   EOSABS 0.0 07/02/2020 1628   EOSABS 0.4 05/30/2019 1010   BASOSABS 0.1 07/02/2020 1628   BASOSABS 0.1 05/30/2019 1010    BMET    Component Value Date/Time   NA 135 07/07/2020 0204   NA 141 06/10/2020 1459   K 3.9 07/07/2020 0204   CL 97 (L) 07/07/2020 0204   CO2 25 07/07/2020 0204   GLUCOSE 240 (H) 07/07/2020 0204   BUN 61 (H) 07/07/2020 6468  BUN 48 (H) 06/10/2020 1459   CREATININE 3.72 (H) 07/07/2020 0204   CREATININE 0.99 09/08/2014 1122   CALCIUM 8.3 (L) 07/07/2020 0204   GFRNONAA 13 (L) 07/07/2020 0204   GFRNONAA 61 09/08/2014 1122   GFRAA 19 (L) 06/10/2020 1459   GFRAA 70 09/08/2014 1122    COAGS: Lab Results  Component Value Date   INR 1.8 (H) 02/19/2020   INR 1.3 (H) 12/09/2019   INR 1.00 05/08/2018     Non-Invasive Vascular Imaging:   Arterial duplex suggesting occlusion of SFA to TP trunk bypass   ASSESSMENT/PLAN: This is a 69 y.o. female with left lower extremity wounds in the presence of PAD  -Arterial duplex performed today suggesting occlusion of left SFA to TP trunk bypass -Left foot does not appear profoundly ischemic and patient denies any ischemic rest pain of left foot -Wounds of left leg improved over the course admission with broad-spectrum antibiotics and diuresis -Given that patient has extensive history with vascular surgery of [place] and that her left  leg is not acutely ischemic, would recommend short order follow up with [facility] vascular surgery when medically stable for discharge home -On call vascular surgeon Dr. Scot Dock will evaluate the patient later today and provide further treatment plans   Dagoberto Ligas PA-C [facility] [phone]

## 2020-07-07 NOTE — Telephone Encounter (Signed)
Please try to r/s

## 2020-07-07 NOTE — Progress Notes (Addendum)
Advanced Heart Failure Rounding Note  PCP-Cardiologist: Minus Breeding, MD  [facility]: Dr. Aundra Dubin   Subjective:    Admitted with a/c diastolic heart failure and LLE cellulitis. On IV Vanc. Blood cultures NGTD. Cellulitis improving.   Had RHC yesterday showing elevated R/LH filling pressures. IV Lasix resumed. I/Os not accurate. Only 750 cc charted yesterday but had some incontinence (wet the bed). Wt trending up. Scr continues to rise, 3.12>>3.37>>3.72 today. CI was 3.22 on RHC.   Hgb trending back down, 8.2>7.8. No overt bleeding. No hypotension.   LE arterial dopplers completed: Limited study. Possible collateral noted at distal femoral artery. Popliteal artery appears occluded. Distal PT and AT appear occluded. Unable to visualize bypass graft.   She feels ok today. No complaints. No resting dyspnea.    RIGHT HEART CATH 07/06/20    Right Heart Pressures RHC Procedural Findings: Hemodynamics (mmHg) RA mean 17 RV 53/18 PA 56/25, mean 36 PCWP mean 27  Oxygen saturations: PA 48% AO 96%  Cardiac Output (Fick) 6.43  Cardiac Index (Fick) 3.22  PVR 1.4 WU   Conclusion  1. Elevated right and left heart filling pressures.  2. Pulmonary venous hypertension.    Objective:   Weight Range: 92.7 kg Body mass index is 32.99 kg/m.   Vital Signs:   Temp:  [98 F (36.7 C)-98.4 F (36.9 C)] 98 F (36.7 C) (11/23 0410) Pulse Rate:  [72-81] 81 (11/23 0410) Resp:  [0-20] 20 (11/23 0410) BP: (111-144)/(52-66) 132/55 (11/23 0410) SpO2:  [90 %-99 %] 98 % (11/23 0410) Weight:  [92.7 kg] 92.7 kg (11/23 0410) Last BM Date: 07/04/20  Weight change: Filed Weights   07/05/20 0425 07/06/20 0322 07/07/20 0410  Weight: 89.3 kg 89.8 kg 92.7 kg    Intake/Output:   Intake/Output Summary (Last 24 hours) at 07/07/2020 0717 Last data filed at 07/07/2020 0300 Gross per 24 hour  Intake 355 ml  Output 750 ml  Net -395 ml      Physical Exam    General:  Morbidly obese, WF laying  in bed. No respiratory difficulty HEENT: normal Neck: supple. Thick neck, JVD not well visualized Carotids 2+ bilat; no bruits. No lymphadenopathy or thyromegaly appreciated. Cor: PMI nondisplaced. Regular rate & rhythm. No rubs, gallops or murmurs. Lungs: clear Abdomen: obese, soft, nontender, nondistended. No hepatosplenomegaly. No bruits or masses. Good bowel sounds. Extremities: no cyanosis, clubbing, rash, edema Neuro: alert & oriented x 3, cranial nerves grossly intact. moves all 4 extremities w/o difficulty. Affect pleasant.  Telemetry   NSR 90s Personally reviewed  Labs    CBC Recent Labs    07/06/20 0320 07/06/20 1003 07/06/20 1007 07/07/20 0204  WBC 8.7  --   --  8.9  HGB 8.2*   < > 9.5* 7.8*  HCT 28.3*   < > 28.0* 27.4*  MCV 73.9*  --   --  73.9*  PLT 374  --   --  342   < > = values in this interval not displayed.   Basic Metabolic Panel Recent Labs    07/06/20 0320 07/06/20 1003 07/06/20 1007 07/07/20 0204  NA 135   < > 139 135  K 4.1   < > 3.9 3.9  CL 98  --   --  97*  CO2 23  --   --  25  GLUCOSE 188*  --   --  240*  BUN 58*  --   --  61*  CREATININE 3.37*  --   --  3.72*  CALCIUM 8.4*  --   --  8.3*   < > = values in this interval not displayed.   Liver Function Tests No results for input(s): AST, ALT, ALKPHOS, BILITOT, PROT, ALBUMIN in the last 72 hours. No results for input(s): LIPASE, AMYLASE in the last 72 hours. Cardiac Enzymes No results for input(s): CKTOTAL, CKMB, CKMBINDEX, TROPONINI in the last 72 hours.  BNP: BNP (last 3 results) Recent Labs    02/21/20 1151 06/03/20 1354 07/02/20 1628  BNP 538.2* 718.7* 1,102.7*    ProBNP (last 3 results) No results for input(s): PROBNP in the last 8760 hours.   D-Dimer No results for input(s): DDIMER in the last 72 hours. Hemoglobin A1C No results for input(s): HGBA1C in the last 72 hours. Fasting Lipid Panel No results for input(s): CHOL, HDL, LDLCALC, TRIG, CHOLHDL, LDLDIRECT in the  last 72 hours. Thyroid Function Tests No results for input(s): TSH, T4TOTAL, T3FREE, THYROIDAB in the last 72 hours.  Invalid input(s): FREET3  Other results:   Imaging    CARDIAC CATHETERIZATION  Result Date: 07/06/2020 1. Elevated right and left heart filling pressures. 2. Pulmonary venous hypertension.   VAS [place] LOWER EXTREMITY ARTERIAL DUPLEX  Result Date: 07/06/2020 LOWER EXTREMITY ARTERIAL DUPLEX STUDY Indications: Rest pain, peripheral artery disease, and Cellulitis. High Risk Factors: Hypertension, hyperlipidemia. Other Factors: TAVR.  Vascular Interventions: . left distal SFA-TP trunk bypass and left TMA in 2/21. Current ABI:            ABI done 04/23/20 at outside facility Limitations: patient unable to turn supine secondary to pain in leg. Constant              left lower extremity spasms Comparison Study: Prior ABI done 04/23/20 Performing Technologist: Sharion Dove RVS  Examination Guidelines: A complete evaluation includes B-mode imaging, spectral Doppler, color Doppler, and power Doppler as needed of all accessible portions of each vessel. Bilateral testing is considered an integral part of a complete examination. Limited examinations for reoccurring indications may be performed as noted.  +----------+--------+-----+--------+-----------+---------------+ LEFT      PSV cm/sRatioStenosisWaveform   Comments        +----------+--------+-----+--------+-----------+---------------+ CFA Distal105                  multiphasic                +----------+--------+-----+--------+-----------+---------------+ DFA       35                                              +----------+--------+-----+--------+-----------+---------------+ SFA Prox  80                                              +----------+--------+-----+--------+-----------+---------------+ SFA Mid   86                                               +----------+--------+-----+--------+-----------+---------------+ SFA Distal31                              thumping signal +----------+--------+-----+--------+-----------+---------------+ POP Prox  9            occluded                           +----------+--------+-----+--------+-----------+---------------+ ATA Distal             occluded                           +----------+--------+-----+--------+-----------+---------------+ PTA Distal             occluded                           +----------+--------+-----+--------+-----------+---------------+  Summary: Left: Limited study. Possible collateral noted at distal femoral artery. Popliteal artery appears occluded. Distal PT and AT appear occluded. Unable to visualize bypass graft.  See table(s) above for measurements and observations. Electronically signed by Monica Martinez MD on 07/06/2020 at 4:59:12 PM.    Final      Medications:     Scheduled Medications: . amiodarone  200 mg Oral Daily  . apixaban  5 mg Oral BID  . docusate sodium  100 mg Oral BID  . fenofibrate  160 mg Oral Daily  . ferrous sulfate  325 mg Oral BID WC  . furosemide  80 mg Intravenous BID  . hydrALAZINE  50 mg Oral TID  . icosapent Ethyl  2 g Oral BID  . insulin aspart  0-5 Units Subcutaneous QHS  . insulin aspart  0-9 Units Subcutaneous TID WC  . insulin NPH Human  20 Units Subcutaneous Q supper  . insulin NPH Human  50 Units Subcutaneous BID WC  . pantoprazole  40 mg Oral BID  . sodium chloride flush  3 mL Intravenous Q12H  . sodium chloride flush  3 mL Intravenous Q12H  . Tafamidis  61 mg Oral Daily    Infusions: . sodium chloride    . 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 - RHC this admit showed elevated R/LH filling pressures. RAP 17. PCWP 27. IV Lasix resumed. Wt continues to trend up and SCr steadily rising.  - Continue IV Lasix 80 mg bid. May benefit from dose of metolazone to augment diuresis. Follow BMP closely  2. CKD stage 3b:  Creatinine baseline 2.6-2.8 Creatinine on admit 3.5>3.5> 3.0 > 3.1>3.4>3.7 today. K stable at 3.9  - Continue diuresis per above.  - follow BMP  - if SCr continues to rise, may need to involve nephrology  3. CAD: Cath in 8/19 with moderate RCA and LCx disease, severe distal LAD disease, medical management. - No s/s angina - No ASA given apixaban use.  - Continue pitivastatin 4 mg daily and Praluent.  - Continue fenofibrate and vascepa.  4. Atrial fibrillation: Paroxysmal.  - Remains in NSR - Eliquis held 11/19. Hgb 7.4 -> 7.7 -> 7.9->8.2->9.9 - Eliquis restarted, hgb trending back down 7.8 today  - Continue amiodarone 200 mg daily. - TSH 1.2.  5. 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  She is off Coreg with junctional bradycardia at 7/21 admission.  - She cannot take amlodipine due to peripheral edema formation.  -  Hold off on ACEI/ARB with CKD.  -Continue hydralazine 50 mg three times a day.  - BP improved today. Continue to monitor  6. PAD: S/p PTCA to right popliteal and right AT in 7/20. Now with left distal SFA-TP trunk bypass and left TMA in 2/21.  - She has followup with vascular surgery at [place].  - Ordered peripheral arterial dopplers with LE ulcerations to see if there is worsening PAD. Limited study. Possible collateral noted at distal femoral artery. Popliteal artery appears occluded. Distal PT and AT appear occluded.  Unable to visualize bypass graft.  - would avoid definitive angiography for now given renal dysfunction  7. 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  - She is on tafamidis, continue.   8. 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  9. Enterococcal endocarditis: Involving mitral valve, 7/21 TEE showed possible old calcified vegetation.  10. Hyperlipidemia: Lipids good on 8/21 panel, TGs controlled now. Continue current regimen.  11. Mitral valve disease: TEE in 7/21 with mild-moderate MR, mild MS.  12. GERD: - ContinueProtonix 40 mg daily. 13. LLE Cellulitis - Completed 2 rounds or oral antibiotics.  - Receiving IV Vanc.  - Blood cultures obtained 07/02/20. NGTD - Looks much better. TRH managing  14. DMII - 04/2020 Hgb A1C 9.9  - CBG low 11/21. Got 1 amp of D50. No further hypoglycemia  - TRH managing 15. Anemia , Iron deficient - Followed by Dr Fuller Plan Had planned EGD/Colonoscopy but this was postponed due to respiratory studies.  - Has not had oral iron in a week.  - Hgb down to 7.4 this admit . No obvious source of bleeding             - MCV persistently low. Given Feraheme 11/21 - Dr. Henrene Pastor has seen recommend outpatient f/u - Eliquis restarted. Hgb trending back down again, 7.8 today  - continue to follow. Transfuse for hgb <7.0.  16. Debility - need to mobilize   Length of Stay: [address], PA-C  07/07/2020, 7:17 AM  Advanced Heart Failure Team Pager [phone] (M-F; [place])  Please contact [facility] for night-coverage after hours (4p -7a ) and weekends on [email or web address]  Patient seen with PA, agree with the above note.   Still with left lower leg pain but significantly decreased erythema.  Creatinine up to 3.7, got IV Lasix yesterday. Hgb lower 8.2 => 7.8 but no overt bleeding.    RHC yesterday with elevated filling pressures.   Peripheral arterial dopplers yesterday with occluded left popliteal, AT and PT.   General: NAD Neck: Thick, JVP difficult, no thyromegaly or thyroid nodule.  Lungs: Clear to auscultation bilaterally  with normal respiratory effort. CV: Nondisplaced PMI.  Heart regular S1/S2, no S3/S4, no murmur.  No peripheral edema.   Abdomen: Soft, nontender, no hepatosplenomegaly, no distention.  Skin: Intact without lesions or rashes.  Neurologic: Alert and oriented x 3.  Psych: Normal affect. Extremities: No clubbing or cyanosis. S/p left TMA.  HEENT: Normal.   Acute on chronic diastolic CHF, diuresed with IV Lasix yesterday, I/Os not accurate due to incontinence. Creatinine higher at 3.7 today. Exam difficult for volume but PCWP and RA pressure elevated on RHC yesterday.  - With rising creatinine, will give her a break from diuretic today.  - Echo to reassess LV function and valves (h/o TAVR, h/o mild-moderate MR and mild MS).  - She can continue home  tafamidis for TTR amyloidosis.  - With worsening renal function and ongoing volume overload, will ask nephrology to see.   H/o Fe deficiency anemia, GI working up as outpatient but has not had scopes yet. Hgb down to 7.4 here initially. Has been FOBT positive.  Seen by GI, not thought to have significant active bleeding, and they elected to defer inpatient endoscopy.  She has had feraheme, hgb 8.2 => 7.8 over the last day, no overt bleeding.   - Restarted Eliquis, follow hgb closely.   Concern for LLE cellulitis, has ulcerations LLE. Erythema LLE much improved with IV vancomycin.   - Think we can stop vancomycin at this point.  - With ulcerations on LLE, arterial dopplers done showing occluded left popliteal and left AT/PT.  She has known PAD left leg and is s/p left TMA.  Will ask vascular to see here, high risk for procedure with contrast given AKI on CKD.    She was in an accelerated junctional rhythm, now back in NSR. Continue amiodarone for now.   [name] 07/07/2020 8:03 AM

## 2020-07-07 NOTE — H&P (View-Only) (Signed)
Advanced Heart Failure Rounding Note  PCP-Cardiologist: Minus Breeding, MD  [facility]: Dr. Aundra Dubin   Subjective:    Admitted with a/c diastolic heart failure and LLE cellulitis. On IV Vanc. Blood cultures NGTD. Cellulitis improving.   Had RHC yesterday showing elevated R/LH filling pressures. IV Lasix resumed. I/Os not accurate. Only 750 cc charted yesterday but had some incontinence (wet the bed). Wt trending up. Scr continues to rise, 3.12>>3.37>>3.72 today. CI was 3.22 on RHC.   Hgb trending back down, 8.2>7.8. No overt bleeding. No hypotension.   LE arterial dopplers completed: Limited study. Possible collateral noted at distal femoral artery. Popliteal artery appears occluded. Distal PT and AT appear occluded. Unable to visualize bypass graft.   She feels ok today. No complaints. No resting dyspnea.    RIGHT HEART CATH 07/06/20    Right Heart Pressures RHC Procedural Findings: Hemodynamics (mmHg) RA mean 17 RV 53/18 PA 56/25, mean 36 PCWP mean 27  Oxygen saturations: PA 48% AO 96%  Cardiac Output (Fick) 6.43  Cardiac Index (Fick) 3.22  PVR 1.4 WU   Conclusion  1. Elevated right and left heart filling pressures.  2. Pulmonary venous hypertension.    Objective:   Weight Range: 92.7 kg Body mass index is 32.99 kg/m.   Vital Signs:   Temp:  [98 F (36.7 C)-98.4 F (36.9 C)] 98 F (36.7 C) (11/23 0410) Pulse Rate:  [72-81] 81 (11/23 0410) Resp:  [0-20] 20 (11/23 0410) BP: (111-144)/(52-66) 132/55 (11/23 0410) SpO2:  [90 %-99 %] 98 % (11/23 0410) Weight:  [92.7 kg] 92.7 kg (11/23 0410) Last BM Date: 07/04/20  Weight change: Filed Weights   07/05/20 0425 07/06/20 0322 07/07/20 0410  Weight: 89.3 kg 89.8 kg 92.7 kg    Intake/Output:   Intake/Output Summary (Last 24 hours) at 07/07/2020 0717 Last data filed at 07/07/2020 0300 Gross per 24 hour  Intake 355 ml  Output 750 ml  Net -395 ml      Physical Exam    General:  Morbidly obese, WF laying  in bed. No respiratory difficulty HEENT: normal Neck: supple. Thick neck, JVD not well visualized Carotids 2+ bilat; no bruits. No lymphadenopathy or thyromegaly appreciated. Cor: PMI nondisplaced. Regular rate & rhythm. No rubs, gallops or murmurs. Lungs: clear Abdomen: obese, soft, nontender, nondistended. No hepatosplenomegaly. No bruits or masses. Good bowel sounds. Extremities: no cyanosis, clubbing, rash, edema Neuro: alert & oriented x 3, cranial nerves grossly intact. moves all 4 extremities w/o difficulty. Affect pleasant.  Telemetry   NSR 90s Personally reviewed  Labs    CBC Recent Labs    07/06/20 0320 07/06/20 1003 07/06/20 1007 07/07/20 0204  WBC 8.7  --   --  8.9  HGB 8.2*   < > 9.5* 7.8*  HCT 28.3*   < > 28.0* 27.4*  MCV 73.9*  --   --  73.9*  PLT 374  --   --  342   < > = values in this interval not displayed.   Basic Metabolic Panel Recent Labs    07/06/20 0320 07/06/20 1003 07/06/20 1007 07/07/20 0204  NA 135   < > 139 135  K 4.1   < > 3.9 3.9  CL 98  --   --  97*  CO2 23  --   --  25  GLUCOSE 188*  --   --  240*  BUN 58*  --   --  61*  CREATININE 3.37*  --   --  3.72*  CALCIUM 8.4*  --   --  8.3*   < > = values in this interval not displayed.   Liver Function Tests No results for input(s): AST, ALT, ALKPHOS, BILITOT, PROT, ALBUMIN in the last 72 hours. No results for input(s): LIPASE, AMYLASE in the last 72 hours. Cardiac Enzymes No results for input(s): CKTOTAL, CKMB, CKMBINDEX, TROPONINI in the last 72 hours.  BNP: BNP (last 3 results) Recent Labs    02/21/20 1151 06/03/20 1354 07/02/20 1628  BNP 538.2* 718.7* 1,102.7*    ProBNP (last 3 results) No results for input(s): PROBNP in the last 8760 hours.   D-Dimer No results for input(s): DDIMER in the last 72 hours. Hemoglobin A1C No results for input(s): HGBA1C in the last 72 hours. Fasting Lipid Panel No results for input(s): CHOL, HDL, LDLCALC, TRIG, CHOLHDL, LDLDIRECT in the  last 72 hours. Thyroid Function Tests No results for input(s): TSH, T4TOTAL, T3FREE, THYROIDAB in the last 72 hours.  Invalid input(s): FREET3  Other results:   Imaging    CARDIAC CATHETERIZATION  Result Date: 07/06/2020 1. Elevated right and left heart filling pressures. 2. Pulmonary venous hypertension.   VAS [place] LOWER EXTREMITY ARTERIAL DUPLEX  Result Date: 07/06/2020 LOWER EXTREMITY ARTERIAL DUPLEX STUDY Indications: Rest pain, peripheral artery disease, and Cellulitis. High Risk Factors: Hypertension, hyperlipidemia. Other Factors: TAVR.  Vascular Interventions: . left distal SFA-TP trunk bypass and left TMA in 2/21. Current ABI:            ABI done 04/23/20 at outside facility Limitations: patient unable to turn supine secondary to pain in leg. Constant              left lower extremity spasms Comparison Study: Prior ABI done 04/23/20 Performing Technologist: Sharion Dove RVS  Examination Guidelines: A complete evaluation includes B-mode imaging, spectral Doppler, color Doppler, and power Doppler as needed of all accessible portions of each vessel. Bilateral testing is considered an integral part of a complete examination. Limited examinations for reoccurring indications may be performed as noted.  +----------+--------+-----+--------+-----------+---------------+ LEFT      PSV cm/sRatioStenosisWaveform   Comments        +----------+--------+-----+--------+-----------+---------------+ CFA Distal105                  multiphasic                +----------+--------+-----+--------+-----------+---------------+ DFA       35                                              +----------+--------+-----+--------+-----------+---------------+ SFA Prox  80                                              +----------+--------+-----+--------+-----------+---------------+ SFA Mid   86                                               +----------+--------+-----+--------+-----------+---------------+ SFA Distal31                              thumping signal +----------+--------+-----+--------+-----------+---------------+ POP Prox  9            occluded                           +----------+--------+-----+--------+-----------+---------------+ ATA Distal             occluded                           +----------+--------+-----+--------+-----------+---------------+ PTA Distal             occluded                           +----------+--------+-----+--------+-----------+---------------+  Summary: Left: Limited study. Possible collateral noted at distal femoral artery. Popliteal artery appears occluded. Distal PT and AT appear occluded. Unable to visualize bypass graft.  See table(s) above for measurements and observations. Electronically signed by Monica Martinez MD on 07/06/2020 at 4:59:12 PM.    Final      Medications:     Scheduled Medications: . amiodarone  200 mg Oral Daily  . apixaban  5 mg Oral BID  . docusate sodium  100 mg Oral BID  . fenofibrate  160 mg Oral Daily  . ferrous sulfate  325 mg Oral BID WC  . furosemide  80 mg Intravenous BID  . hydrALAZINE  50 mg Oral TID  . icosapent Ethyl  2 g Oral BID  . insulin aspart  0-5 Units Subcutaneous QHS  . insulin aspart  0-9 Units Subcutaneous TID WC  . insulin NPH Human  20 Units Subcutaneous Q supper  . insulin NPH Human  50 Units Subcutaneous BID WC  . pantoprazole  40 mg Oral BID  . sodium chloride flush  3 mL Intravenous Q12H  . sodium chloride flush  3 mL Intravenous Q12H  . Tafamidis  61 mg Oral Daily    Infusions: . sodium chloride    . 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 - RHC this admit showed elevated R/LH filling pressures. RAP 17. PCWP 27. IV Lasix resumed. Wt continues to trend up and SCr steadily rising.  - Continue IV Lasix 80 mg bid. May benefit from dose of metolazone to augment diuresis. Follow BMP closely  2. CKD stage 3b:  Creatinine baseline 2.6-2.8 Creatinine on admit 3.5>3.5> 3.0 > 3.1>3.4>3.7 today. K stable at 3.9  - Continue diuresis per above.  - follow BMP  - if SCr continues to rise, may need to involve nephrology  3. CAD: Cath in 8/19 with moderate RCA and LCx disease, severe distal LAD disease, medical management. - No s/s angina - No ASA given apixaban use.  - Continue pitivastatin 4 mg daily and Praluent.  - Continue fenofibrate and vascepa.  4. Atrial fibrillation: Paroxysmal.  - Remains in NSR - Eliquis held 11/19. Hgb 7.4 -> 7.7 -> 7.9->8.2->9.9 - Eliquis restarted, hgb trending back down 7.8 today  - Continue amiodarone 200 mg daily. - TSH 1.2.  5. XBM:[id number] on admit.  She is off Coreg with junctional bradycardia at 7/21 admission.  - She cannot take amlodipine due to peripheral edema formation.  -  Hold off on ACEI/ARB with CKD.  -Continue hydralazine 50 mg three times a day.  - BP improved today. Continue to monitor  6. PAD: S/p PTCA to right popliteal and right AT in 7/20. Now with left distal SFA-TP trunk bypass and left TMA in 2/21.  - She has followup with vascular surgery at [place].  - Ordered peripheral arterial dopplers with LE ulcerations to see if there is worsening PAD. Limited study. Possible collateral noted at distal femoral artery. Popliteal artery appears occluded. Distal PT and AT appear occluded.  Unable to visualize bypass graft.  - would avoid definitive angiography for now given renal dysfunction  7. 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  - She is on tafamidis, continue.   8. 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  9. Enterococcal endocarditis: Involving mitral valve, 7/21 TEE showed possible old calcified vegetation.  10. Hyperlipidemia: Lipids good on 8/21 panel, TGs controlled now. Continue current regimen.  11. Mitral valve disease: TEE in 7/21 with mild-moderate MR, mild MS.  12. GERD: - ContinueProtonix 40 mg daily. 13. LLE Cellulitis - Completed 2 rounds or oral antibiotics.  - Receiving IV Vanc.  - Blood cultures obtained 07/02/20. NGTD - Looks much better. TRH managing  14. DMII - 04/2020 Hgb A1C 9.9  - CBG low 11/21. Got 1 amp of D50. No further hypoglycemia  - TRH managing 15. Anemia , Iron deficient - Followed by Dr Fuller Plan Had planned EGD/Colonoscopy but this was postponed due to respiratory studies.  - Has not had oral iron in a week.  - Hgb down to 7.4 this admit . No obvious source of bleeding             - MCV persistently low. Given Feraheme 11/21 - Dr. Henrene Pastor has seen recommend outpatient f/u - Eliquis restarted. Hgb trending back down again, 7.8 today  - continue to follow. Transfuse for hgb <7.0.  16. Debility - need to mobilize   Length of Stay: [address] PA-C  07/07/2020, 7:17 AM  Advanced Heart Failure Team Pager [phone] (M-F; [place])  Please contact [facility] for night-coverage after hours (4p -7a ) and weekends on [email or web address]  Patient seen with PA, agree with the above note.   Still with left lower leg pain but significantly decreased erythema.  Creatinine up to 3.7, got IV Lasix yesterday. Hgb lower 8.2 => 7.8 but no overt bleeding.    RHC yesterday with elevated filling pressures.   Peripheral arterial dopplers yesterday with occluded left popliteal, AT and PT.   General: NAD Neck: Thick, JVP difficult, no thyromegaly or thyroid nodule.  Lungs: Clear to auscultation bilaterally  with normal respiratory effort. CV: Nondisplaced PMI.  Heart regular S1/S2, no S3/S4, no murmur.  No peripheral edema.   Abdomen: Soft, nontender, no hepatosplenomegaly, no distention.  Skin: Intact without lesions or rashes.  Neurologic: Alert and oriented x 3.  Psych: Normal affect. Extremities: No clubbing or cyanosis. S/p left TMA.  HEENT: Normal.   Acute on chronic diastolic CHF, diuresed with IV Lasix yesterday, I/Os not accurate due to incontinence. Creatinine higher at 3.7 today. Exam difficult for volume but PCWP and RA pressure elevated on RHC yesterday.  - With rising creatinine, will give her a break from diuretic today.  - Echo to reassess LV function and valves (h/o TAVR, h/o mild-moderate MR and mild MS).  - She can continue home  tafamidis for TTR amyloidosis.  - With worsening renal function and ongoing volume overload, will ask nephrology to see.   H/o Fe deficiency anemia, GI working up as outpatient but has not had scopes yet. Hgb down to 7.4 here initially. Has been FOBT positive.  Seen by GI, not thought to have significant active bleeding, and they elected to defer inpatient endoscopy.  She has had feraheme, hgb 8.2 => 7.8 over the last day, no overt bleeding.   - Restarted Eliquis, follow hgb closely.   Concern for LLE cellulitis, has ulcerations LLE. Erythema LLE much improved with IV vancomycin.   - Think we can stop vancomycin at this point.  - With ulcerations on LLE, arterial dopplers done showing occluded left popliteal and left AT/PT.  She has known PAD left leg and is s/p left TMA.  Will ask vascular to see here, high risk for procedure with contrast given AKI on CKD.    She was in an accelerated junctional rhythm, now back in NSR. Continue amiodarone for now.   [name] 07/07/2020 8:03 AM

## 2020-07-07 NOTE — Care Management Important Message (Signed)
Important Message  Patient Details  Name: MARSHELL RIEGER MRN: [id number] Date of Birth: Sep 14, 1950   Medicare Important Message Given:  Yes     Shelda Altes 07/07/2020, 11:54 AM

## 2020-07-07 NOTE — Progress Notes (Signed)
  Echocardiogram 2D Echocardiogram has been performed.  [name] 07/07/2020, 2:43 PM

## 2020-07-07 NOTE — Consult Note (Signed)
[facility] Renal Consultation Note  Requesting MD: [name], MD Indication for Consultation:  AKI on CKD, overload   Chief complaint: shortness of breath and fluid overload   HPI:  [name] is a 69 y.o. female with a history including CKD stage IV, chronic diastolic CHF, type 2 diabetes mellitus, hypertension, A. fib, nonobstructive CAD, obesity s/p gastric sleeve, and severe aortic stenosis status post TAVR, and OSA not compliant with CPAP who presented with shortness of breath and volume overload.  She has been diuresed and Cr has risen as per labs below.  She follows with Dr. Joelyn Oms at [facility] for her CKD with recent labs from [facility] as below.  At the time of her visit she was on torsemide 20 mg twice a day and was kept on this dose.  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  Note also a history of symptomatic bradycardia related to amiodarone and carvedilol which improved with dose reduction of the amiodarone and stopping her carvedilol; she then had tachyarrhythmia requiring DCCV and this resolved per charting. Recent baseline Cr 2.8 though in October 2021, Cr 3.5 and 3.2.    [facility] med list includes olmesartan-amlodipine-HCTZ which is not on [facility] med list.  She most recently had torsemide 40 mg daily from 11/20 and 11/21 then had lasix 80 mg IV x 2 doses on 11/22.  Lasix was held today.  She has been on room air.  Had 800 mL UOP over 11/22.  She got a dose of feraheme earlier this hospitalization.  She has been on vanc for LLE cellulitis and note this was just stopped per cardiology.  She had a right heart cath with elevated filling pressures and IV lasix was resumed after that.  Now is not ordered.  Note diagnosis of cardiac amyloidosis.   She states that her breathing is still complicated by a wheezing, nagging cough.  States she has also had a hard time recovering  from RSV infection.  She states that her leg swelling is much better than when she was admitted.  She states that she was recently started on Bactrim for cellulitis as an outpatient  We discussed the risks benefits and indications for blood transfusion and she consents for blood transfusion.  We also discussed the risks and benefits and indications erythropoietin stimulating agents and she consents to aranesp.   Creat  Date/Time Value Ref Range Status  09/08/2014 11:22 AM 0.99 0.50 - 1.10 mg/dL Final  07/03/2013 11:03 AM 1.17 (H) 0.50 - 1.10 mg/dL Final  01/24/2013 11:00 AM 1.04 0.50 - 1.10 mg/dL Final   Creatinine, Ser  Date/Time Value Ref Range Status  07/07/2020 02:04 AM 3.72 (H) 0.44 - 1.00 mg/dL Final  07/06/2020 03:20 AM 3.37 (H) 0.44 - 1.00 mg/dL Final  07/05/2020 02:58 AM 3.12 (H) 0.44 - 1.00 mg/dL Final  07/04/2020 02:58 AM 3.03 (H) 0.44 - 1.00 mg/dL Final  07/03/2020 02:48 AM 3.49 (H) 0.44 - 1.00 mg/dL Final  07/02/2020 04:28 PM 3.54 (H) 0.44 - 1.00 mg/dL Final  06/17/2020 06:21 PM 2.79 (H) 0.44 - 1.00 mg/dL Final  06/10/2020 02:59 PM 2.81 (H) 0.57 - 1.00 mg/dL Final  06/03/2020 01:13 PM 3.52 (H) 0.44 - 1.00 mg/dL Final  05/19/2020 04:09 PM 3.19 (H) 0.44 - 1.00 mg/dL Final  04/21/2020 04:20 PM 2.66 (H) 0.57 - 1.00 mg/dL Final  03/31/2020 11:32 AM 2.83 (H) 0.57 - 1.00 mg/dL Final  03/23/2020 02:17 PM 3.32 (H) 0.44 - 1.00 mg/dL Final  03/02/2020 10:35 AM 2.90 (H) 0.44 - 1.00 mg/dL Final  02/27/2020 04:27 AM 2.69 (H) 0.44 - 1.00 mg/dL Final  02/26/2020 04:02 AM 2.36 (H) 0.44 - 1.00 mg/dL Final  02/25/2020 03:39 AM 2.69 (H) 0.44 - 1.00 mg/dL Final  02/24/2020 02:16 AM 2.83 (H) 0.44 - 1.00 mg/dL Final  02/23/2020 03:22 AM 2.68 (H) 0.44 - 1.00 mg/dL Final  02/22/2020 02:59 AM 2.84 (H) 0.44 - 1.00 mg/dL Final  02/21/2020 03:22 AM 2.98 (H) 0.44 - 1.00 mg/dL Final  02/20/2020 03:33 AM 3.28 (H) 0.44 - 1.00 mg/dL Final  02/19/2020 01:30 PM 3.29 (H) 0.44 - 1.00 mg/dL Final   02/11/2020 01:05 PM 3.44 (HH) 0.57 - 1.00 mg/dL Final    Comment:                      Client Requested Flag  01/17/2020 04:02 AM 3.08 (H) 0.44 - 1.00 mg/dL Final  01/16/2020 03:00 PM 3.07 (H) 0.44 - 1.00 mg/dL Final  12/20/2019 11:29 AM 2.69 (H) 0.57 - 1.00 mg/dL Final  12/10/2019 09:43 AM 2.58 (H) 0.44 - 1.00 mg/dL Final  12/09/2019 05:50 AM 2.47 (H) 0.44 - 1.00 mg/dL Final  11/21/2019 02:20 PM 2.73 (H) 0.57 - 1.00 mg/dL Final  11/18/2019 03:15 PM 2.70 (H) 0.44 - 1.00 mg/dL Final  07/01/2019 02:37 PM 2.48 (H) 0.57 - 1.00 mg/dL Final  06/14/2019 02:32 PM 2.68 (H) 0.44 - 1.00 mg/dL Final  05/30/2019 10:10 AM 2.38 (H) 0.57 - 1.00 mg/dL Final  04/26/2019 04:02 AM 2.02 (H) 0.44 - 1.00 mg/dL Final  04/25/2019 02:28 AM 2.27 (H) 0.44 - 1.00 mg/dL Final  04/24/2019 01:56 AM 2.60 (H) 0.44 - 1.00 mg/dL Final  04/23/2019 07:13 AM 2.71 (H) 0.44 - 1.00 mg/dL Final  04/22/2019 03:49 AM 2.67 (H) 0.44 - 1.00 mg/dL Final  04/20/2019 07:24 AM 2.41 (H) 0.44 - 1.00 mg/dL Final  04/19/2019 06:39 AM 2.39 (H) 0.44 - 1.00 mg/dL Final  04/17/2019 02:10 AM 2.30 (H) 0.44 - 1.00 mg/dL Final  04/16/2019 05:54 AM 2.20 (H) 0.44 - 1.00 mg/dL Final  04/15/2019 08:53 PM 2.33 (H) 0.44 - 1.00 mg/dL Final  03/11/2019 08:57 AM 2.06 (H) 0.57 - 1.00 mg/dL Final  01/08/2019 10:22 PM 2.97 (H) 0.44 - 1.00 mg/dL Final  12/19/2018 11:00 AM 2.24 (H) 0.57 - 1.00 mg/dL Final  10/16/2018 01:15 PM 3.00 (H) 0.44 - 1.00 mg/dL Final  10/08/2018 11:45 AM 2.39 (H) 0.44 - 1.00 mg/dL Final  10/01/2018 12:45 PM 2.46 (H) 0.44 - 1.00 mg/dL Final  09/28/2018 03:00 AM 3.21 (H) 0.44 - 1.00 mg/dL Final  09/27/2018 05:26 AM 2.77 (H) 0.44 - 1.00 mg/dL Final     PMHx:   Past Medical History:  Diagnosis Date  . Atrial fibrillation ([place])    a. maintaining sinus s/p DCCV, on Eliquis  . CAD (coronary artery disease)    a. Nonobstructive by cath 2006 - 25% LAD, 25-30% after 1st diag, distal 25% PDA, minor irreg LCx. b. Normal nuc 2011.  Marland Kitchen CHF  (congestive heart failure) ([place])   . CKD (chronic kidney disease), stage III ([place])   . Cystocele   . Difficult intubation 09-07-2012   big neck trouble with intubation parotid surgery"takes little med to sedate"  . Esophageal stricture   . GERD (gastroesophageal reflux disease)   . Hiatal hernia    a. 07/2013: HH with small stricture holding up barium tablet (concides with patient's  sx of food sticking).  . History of kidney stones   . Hyperlipidemia   . Hypertension   . IBS (irritable bowel syndrome)   . Memory difficulty 11/26/2015  . Morbid obesity ([place])    a. hx of gastric bypass (sleeve gastrectomy 2015)  . Non-STEMI (non-ST elevated myocardial infarction) ([place]) 01/16/2020  . Osteoporosis   . 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  Marland Kitchen Pericardial effusion    a. Mod by echo 2012 at time of PNA. b. Echo 07/2012: small pericardial effusion vs fat.  . Pressure ulcer 04/16/2019   right foot  . S/P TAVR (transcatheter aortic valve replacement)   . Severe aortic stenosis   . Sleep apnea     "have mask; don't use it" (02/14/2018)  . TIA (transient ischemic attack) 10/2014; 06/2016   "some memory issues since; daughter says my speech is sometimes different" (02/14/2018)  . Type II diabetes mellitus ([place])     Past Surgical History:  Procedure Laterality Date  . BREATH TEK H PYLORI N/A 07/29/2013   Procedure: [place];  Surgeon: Shann Medal, MD;  Location: [facility];  Service: General;  Laterality: N/A;  . CARDIAC CATHETERIZATION  2006  . CARDIOVERSION N/A 02/16/2018   Procedure: CARDIOVERSION;  Surgeon: Pixie Casino, MD;  Location: [facility] ENDOSCOPY;  Service: Cardiovascular;  Laterality: N/A;  . CARDIOVERSION N/A 02/25/2020   Procedure: CARDIOVERSION;  Surgeon: Larey Dresser, MD;  Location: [facility];  Service: Cardiovascular;  Laterality: N/A;  . CARPAL TUNNEL RELEASE Left 2011  . CATARACT EXTRACTION W/ INTRAOCULAR LENS  IMPLANT, BILATERAL Bilateral  2003   left  . CHOLECYSTECTOMY OPEN  1982  . COLONOSCOPY N/A 07/14/2015   Procedure: COLONOSCOPY;  Surgeon: Ladene Artist, MD;  Location: [facility];  Service: Endoscopy;  Laterality: N/A;  . ESOPHAGOGASTRODUODENOSCOPY N/A 12/27/2013   Procedure: ESOPHAGOGASTRODUODENOSCOPY (EGD);  Surgeon: Shann Medal, MD;  Location: [facility];  Service: General;  Laterality: N/A;  . ESOPHAGOGASTRODUODENOSCOPY (EGD) WITH ESOPHAGEAL DILATION    . INTRAOPERATIVE TRANSTHORACIC ECHOCARDIOGRAM  05/08/2018   Procedure: INTRAOPERATIVE TRANSTHORACIC ECHOCARDIOGRAM;  Surgeon: Burnell [facility], MD;  Location: [place];  Service: Open Heart Surgery;;  . IR FLUORO GUIDE CV LINE RIGHT  04/25/2019  . IR REMOVAL TUN CV CATH W/O FL  06/20/2019  . IR US GUIDE VASC ACCESS RIGHT  04/25/2019  . LAPAROSCOPIC GASTRIC SLEEVE RESECTION N/A 01/20/2014   Procedure: LAPAROSCOPIC GASTRIC SLEEVE RESECTION;  Surgeon: Shann Medal, MD;  Location: [facility];  Service: General;  Laterality: N/A;  . PAROTIDECTOMY  09/07/2012   Procedure: PAROTIDECTOMY;  Surgeon: Melida Quitter, MD;  Location: [place];  Service: ENT;  Laterality: Left;  LEFT PAROTIDECTOMY  . RIGHT HEART CATH N/A 09/25/2018   Procedure: RIGHT HEART CATH;  Surgeon: Larey Dresser, MD;  Location: [place] CV [facility];  Service: Cardiovascular;  Laterality: N/A;  . RIGHT HEART CATH N/A 07/06/2020   Procedure: RIGHT HEART CATH;  Surgeon: Larey Dresser, MD;  Location: [place] [facility];  Service: Cardiovascular;  Laterality: N/A;  . RIGHT/LEFT HEART CATH AND CORONARY ANGIOGRAPHY N/A 03/29/2018   Procedure: RIGHT/LEFT HEART CATH AND CORONARY ANGIOGRAPHY;  Surgeon: Burnell Blanks, MD;  Location: [place] [facility];  Service: Cardiovascular;  Laterality: N/A;  . TEE WITHOUT CARDIOVERSION N/A 02/16/2018   Procedure: TRANSESOPHAGEAL ECHOCARDIOGRAM (TEE);  Surgeon: Pixie Casino, MD;  Location: [facility] ENDOSCOPY;  Service: Cardiovascular;  Laterality: N/A;  . TEE WITHOUT  CARDIOVERSION N/A 06/05/2019   Procedure: TRANSESOPHAGEAL ECHOCARDIOGRAM (TEE);  Surgeon: Sanda [facility], MD;  Location: [place];  Service: Cardiovascular;  Laterality: N/A;  . TEE WITHOUT CARDIOVERSION N/A 04/24/2019   Procedure: TRANSESOPHAGEAL ECHOCARDIOGRAM (TEE);  Surgeon: Sanda [facility], MD;  Location: [place];  Service: Cardiovascular;  Laterality: N/A;  . TEE WITHOUT CARDIOVERSION N/A 02/25/2020   Procedure: TRANSESOPHAGEAL ECHOCARDIOGRAM (TEE);  Surgeon: Larey Dresser, MD;  Location: [facility] ENDOSCOPY;  Service: Cardiovascular;  Laterality: N/A;  . TOE AMPUTATION Left 07/2017   great toe; [facility]   . TONSILLECTOMY  1968  . TRANSCATHETER AORTIC VALVE REPLACEMENT, TRANSFEMORAL  05/08/2018   Transcatheter Aortic Valve Replacement   . TRANSCATHETER AORTIC VALVE REPLACEMENT, TRANSFEMORAL N/A 05/08/2018   Procedure: TRANSCATHETER AORTIC VALVE REPLACEMENT, TRANSFEMORAL;  Surgeon: Burnell Blanks, MD;  Location: [place];  Service: Open Heart Surgery;  Laterality: N/A;  . TUBAL LIGATION  yrs ago  . UPPER GI ENDOSCOPY  01/20/2014   Procedure: UPPER GI ENDOSCOPY;  Surgeon: Shann [facility], MD;  Location: [facility];  Service: General;;    Family Hx:  Family History  Problem Relation Age of Onset  . Heart failure Mother   . Hypertension Mother   . Skin cancer Mother   . Colitis Mother   . Diabetes Mother   . Kidney Stones Mother   . Stroke Mother   . Heart disease Father   . Colon polyps Father   . Diabetes Father   . Heart failure Father   . Heart attack Sister   . Stroke Maternal Grandfather   . Diabetes Maternal Grandfather   . Diabetes Daughter        prediabetes  . Colon cancer Neg Hx     Social History:  reports that she has never smoked. She has never used smokeless tobacco. She reports previous alcohol use. She reports that she does not use drugs.  Allergies:  Allergies  Allergen Reactions  . Amlodipine Swelling    Ankle swelling  . Atorvastatin Other (See  Comments)    Myalgias   . Crestor [Rosuvastatin Calcium] Other (See Comments)    Stiffness and back pain  . Ezetimibe-Simvastatin Other (See Comments)    Leg cramps  . Sitagliptin Other (See Comments)  . Statins Other (See Comments)    Myalgias  . Celebrex [Celecoxib] Rash  . Levemir [Insulin Detemir] Rash    Medications: Prior to Admission medications   Medication Sig Start Date End Date Taking? Authorizing Provider  ACCU-CHEK AVIVA PLUS test strip USE TO CHECK BLOOD SUGAR UP TO TWICE DAILY OR AS INSTRUCTED Patient taking differently: 1 each by Other route 2 (two) times daily.  09/04/17  Yes Chipper Herb, MD  amiodarone (PACERONE) 200 MG tablet Take 200 mg by mouth daily.   Yes [provider]  B-D INS SYR ULTRAFINE 1CC/31G 31G X 5/16" 1 ML MISC USE TO INJECT INSULIN TWICE A DAY AS INSTRUCTED Patient taking differently: 1 Stick by Other route 3 (three) times daily before meals.  11/16/15  Yes Chipper Herb, MD  benzonatate (TESSALON PERLES) 100 MG capsule Take 1 capsule (100 mg total) by mouth 3 (three) times daily as needed. 06/10/20  Yes Ronnie Doss M, DO  Calcium Carbonate (CALCIUM 600 PO) Take 1 tablet by mouth daily.   Yes [provider]  Cholecalciferol (VITAMIN D3 PO) Take 1 tablet by mouth daily.    Yes [provider]  Cyanocobalamin (VITAMIN B-12 PO) Take 1 tablet by mouth daily.  Yes [provider]  docusate sodium (COLACE) 50 MG capsule Take 2 capsules (100 mg total) by mouth 2 (two) times daily. Patient taking differently: Take 100 mg by mouth daily.  03/23/20  Yes Larey Dresser, MD  ELIQUIS 5 MG TABS tablet TAKE 1 TABLET BY MOUTH TWICE A DAY Patient taking differently: Take 5 mg by mouth 2 (two) times daily.  02/03/20  Yes Minus Breeding, MD  fenofibrate (TRICOR) 145 MG tablet TAKE 1 TABLET BY MOUTH EVERY DAY Patient taking differently: Take 145 mg by mouth daily.  12/16/19  Yes Larey Dresser, MD  ferrous sulfate  (FERROUSUL) 325 (65 FE) MG tablet Take 1 tablet (325 mg total) by mouth 2 (two) times daily with a meal. 06/10/20  Yes Cirigliano, Vito V, DO  hydrALAZINE (APRESOLINE) 50 MG tablet Take 50 mg by mouth 3 (three) times daily.   Yes [provider]  insulin NPH Human (NOVOLIN N) 100 UNIT/ML injection Inject 20-60 Units into the skin 2 (two) times daily before a meal. 60 units in the am 60 units at lunch and 20 units for supper - on a sliding scale   Yes [provider]  Insulin Pen Needle 32G X 4 MM MISC Use to inject insulin with insulin pen 05/17/16  Yes Chipper Herb, MD  loratadine (CLARITIN) 10 MG tablet Take 10 mg by mouth daily.    Yes [provider]  meclizine (ANTIVERT) 12.5 MG tablet Take 1 tablet (12.5 mg total) by mouth 3 (three) times daily as needed for dizziness. 06/14/19  Yes Larey Dresser, MD  Multiple Vitamins-Minerals (MULTIVITAMIN GUMMIES ADULT PO) Taking two gummies by mouth daily   Yes [provider]  NOVOLIN R RELION 100 UNIT/ML injection Inject 20-30 Units into the skin 3 (three) times daily before meals. Per sliding scale 02/07/20  Yes [provider]  nutrition supplement, JUVEN, (JUVEN) PACK Take 1 packet by mouth 2 (two) times daily between meals. 04/26/19  Yes Swayze, Ava, DO  pantoprazole (PROTONIX) 40 MG tablet Take 1 tablet (40 mg total) by mouth daily. 05/19/20  Yes Larey Dresser, MD  Pitavastatin Calcium (LIVALO) 4 MG TABS Take 4 mg by mouth daily.    Yes [provider]  PRALUENT 75 MG/ML SOAJ INJECT 75 MG INTO THE SKIN EVERY 14 (FOURTEEN) DAYS. 11/18/19  Yes Larey Dresser, MD  RELION INSULIN SYRINGE 31G X 15/64" 1 ML MISC Inject 1 Syringe into the skin 3 (three) times daily. 01/04/20  Yes [provider]  sulfamethoxazole-trimethoprim (BACTRIM) 400-80 MG tablet Take 1 tablet by mouth daily. (renally dosed) 06/26/20  Yes Gottschalk, Ashly M, DO  Tafamidis (VYNDAMAX) 61 MG CAPS Take 61 mg by mouth daily.  07/29/19  Yes Larey Dresser, MD  torsemide (DEMADEX) 20 MG tablet Take 1 tablet (20 mg total) by mouth 2 (two) times daily. 05/27/20  Yes Larey Dresser, MD  VASCEPA 1 g capsule TAKE 2 CAPSULES (2 G TOTAL) BY MOUTH 2 (TWO) TIMES A DAY. Patient taking differently: Take 4 g by mouth 2 (two) times daily.  01/16/20  Yes Larey Dresser, MD  guaiFENesin-codeine 100-10 MG/5ML syrup Take 5 mLs by mouth every 6 (six) hours as needed for cough. Patient not taking: Reported on 07/03/2020 06/10/20   [name], DO  nystatin cream (MYCOSTATIN) Apply 1 application topically 2 (two) times daily. x7 days to sacral area Patient not taking: Reported on 07/03/2020 06/10/20   [name], DO  Olmesartan-Amlodipine-HCTZ (TRIBENZOR) 40-5-12.5 MG TABS Take 1 tablet by mouth daily.    04/12/18  [provider]    I have reviewed the patient's current and reported prior to admission medications.  Labs:  BMP Latest Ref Rng & Units 07/07/2020 07/06/2020 07/06/2020  Glucose 70 - 99 mg/dL 240(H) - -  BUN 8 - 23 mg/dL 61(H) - -  Creatinine 0.44 - 1.00 mg/dL 3.72(H) - -  BUN/Creat Ratio 12 - 28 - - -  Sodium 135 - 145 mmol/L 135 139 138  Potassium 3.5 - 5.1 mmol/L 3.9 3.9 3.9  Chloride 98 - 111 mmol/L 97(L) - -  CO2 22 - 32 mmol/L 25 - -  Calcium 8.9 - 10.3 mg/dL 8.3(L) - -    ROS:  Pertinent items noted in HPI and remainder of comprehensive ROS otherwise negative.    Physical Exam: Vitals:   07/06/20 2116 07/07/20 0410  BP: (!) 131/56 (!) 132/55  Pulse: 76 81  Resp:  20  Temp: 98 F (36.7 C) 98 F (36.7 C)  SpO2: 99% 98%     General: Adult female in bed obese habitus no acute distress at rest HEENT: Normocephalic atraumatic Eyes: Extraocular movements intact sclera anicteric Neck: Increased neck circumference trachea midline Heart: S1-S2 no rub appreciated Lungs: Clear but reduced breath sounds on my exam.  She is on room air.  Unlabored at rest Abdomen: Softly  distended/obese/nontender Extremities: Trace to 1+ edema bilateral lower extremities no cyanosis or clubbing Skin: Left foot is wrapped no rash on extremities exposed Neuro: Alert and oriented x3 provides a history and follows commands Psych normal mood and affect  Assessment/Plan:  # AKI - Pre-renal insults in setting of diuresis for acute CHF exacerbation however also with anemia and vanc use  - She is nearing the need for dialysis but has no acute need  - Agree with pausing diuresis today if stable - she is on room air  - Would addressing anemia as below to optimize renal recovery  - Would plan to try lasix 80 mg IV once preceded by metolazone 5 mg po once tomorrow, 11/24  - Would not resume the reported home med of tribenzor - olmesartan-amlodipine-HCTZ unclear if she has been taking?   # CKD stage IV  - Secondary to DM, HTN and CHF  - Baseline Cr 2.8 - 3  # Acute on chronic diastolic CHF -Diuretics as above She is on room air and reports marked improvement in leg swelling though still with a cough which she attributes also to RSV  # Hypertension - acceptable control   # Anemia of CKD - hemoglobin down to 7.8. received IV iron earlier this hospitalization - Will transfuse PRBC's to help optimize renal recovery   - Start aranesp 40 mcg once on 11/23     # Left leg cellulitis Status post vancomycin and previously on Bactrim as an outpatient Would avoid Bactrim in advanced CKD and note that Vanc has been stopped  Thank you for the consult.  Will contact primary team.  Please do not hesitate to contact me with any questions   Claudia Desanctis 07/07/2020, 1:47 PM

## 2020-07-08 ENCOUNTER — Encounter (HOSPITAL_COMMUNITY): Payer: Self-pay | Admitting: Cardiology

## 2020-07-08 ENCOUNTER — Inpatient Hospital Stay (HOSPITAL_COMMUNITY): Payer: Medicare Other

## 2020-07-08 ENCOUNTER — Inpatient Hospital Stay (HOSPITAL_COMMUNITY): Payer: Medicare Other | Admitting: Anesthesiology

## 2020-07-08 ENCOUNTER — Encounter (HOSPITAL_COMMUNITY)
Admission: AD | Disposition: A | Discharge status: Home Health | Payer: Self-pay | Source: Ambulatory Visit | Attending: Cardiology

## 2020-07-08 DIAGNOSIS — I340 Nonrheumatic mitral (valve) insufficiency: Secondary | ICD-10-CM

## 2020-07-08 DIAGNOSIS — I313 Pericardial effusion (noninflammatory): Secondary | ICD-10-CM | POA: Diagnosis not present

## 2020-07-08 DIAGNOSIS — I350 Nonrheumatic aortic (valve) stenosis: Secondary | ICD-10-CM | POA: Diagnosis not present

## 2020-07-08 DIAGNOSIS — I5033 Acute on chronic diastolic (congestive) heart failure: Secondary | ICD-10-CM | POA: Diagnosis not present

## 2020-07-08 DIAGNOSIS — I342 Nonrheumatic mitral (valve) stenosis: Secondary | ICD-10-CM | POA: Diagnosis not present

## 2020-07-08 HISTORY — PX: TEE WITHOUT CARDIOVERSION: SHX5443

## 2020-07-08 LAB — GLUCOSE, CAPILLARY
Glucose-Capillary: 105 mg/dL — ABNORMAL HIGH (ref 70–99)
Glucose-Capillary: 113 mg/dL — ABNORMAL HIGH (ref 70–99)
Glucose-Capillary: 126 mg/dL — ABNORMAL HIGH (ref 70–99)
Glucose-Capillary: 129 mg/dL — ABNORMAL HIGH (ref 70–99)
Glucose-Capillary: 160 mg/dL — ABNORMAL HIGH (ref 70–99)
Glucose-Capillary: 42 mg/dL — CL (ref 70–99)
Glucose-Capillary: 45 mg/dL — ABNORMAL LOW (ref 70–99)
Glucose-Capillary: 50 mg/dL — ABNORMAL LOW (ref 70–99)
Glucose-Capillary: 52 mg/dL — ABNORMAL LOW (ref 70–99)
Glucose-Capillary: 54 mg/dL — ABNORMAL LOW (ref 70–99)
Glucose-Capillary: 84 mg/dL (ref 70–99)
Glucose-Capillary: 91 mg/dL (ref 70–99)
Glucose-Capillary: 96 mg/dL (ref 70–99)

## 2020-07-08 LAB — CBC
HCT: 32.5 % — ABNORMAL LOW (ref 36.0–46.0)
Hemoglobin: 9.5 g/dL — ABNORMAL LOW (ref 12.0–15.0)
MCH: 22.5 pg — ABNORMAL LOW (ref 26.0–34.0)
MCHC: 29.2 g/dL — ABNORMAL LOW (ref 30.0–36.0)
MCV: 77 fL — ABNORMAL LOW (ref 80.0–100.0)
Platelets: 389 10*3/uL (ref 150–400)
RBC: 4.22 MIL/uL (ref 3.87–5.11)
RDW: 25 % — ABNORMAL HIGH (ref 11.5–15.5)
WBC: 9.4 10*3/uL (ref 4.0–10.5)
nRBC: 1.1 % — ABNORMAL HIGH (ref 0.0–0.2)

## 2020-07-08 LAB — BASIC METABOLIC PANEL
Anion gap: 11 (ref 5–15)
BUN: 58 mg/dL — ABNORMAL HIGH (ref 8–23)
CO2: 28 mmol/L (ref 22–32)
Calcium: 8.6 mg/dL — ABNORMAL LOW (ref 8.9–10.3)
Chloride: 100 mmol/L (ref 98–111)
Creatinine, Ser: 3.21 mg/dL — ABNORMAL HIGH (ref 0.44–1.00)
GFR, Estimated: 15 mL/min — ABNORMAL LOW (ref >60)
Glucose, Bld: 97 mg/dL (ref 70–99)
Potassium: 3.7 mmol/L (ref 3.5–5.1)
Sodium: 139 mmol/L (ref 135–145)

## 2020-07-08 LAB — ECHO TEE
AR max vel: 0.96 cm2
AV Area VTI: 1 cm2
AV Area mean vel: 0.96 cm2
AV Mean grad: 27.5 mmHg
AV Peak grad: 43.6 mmHg
Ao pk vel: 3.3 m/s
Area-P 1/2: 4.49 cm2

## 2020-07-08 LAB — BPAM RBC
Blood Product Expiration Date: 202112202359
ISSUE DATE / TIME: 202111231923
Unit Type and Rh: 6200

## 2020-07-08 LAB — TYPE AND SCREEN
ABO/RH(D): A POS
Antibody Screen: NEGATIVE
Unit division: 0

## 2020-07-08 SURGERY — ECHOCARDIOGRAM, TRANSESOPHAGEAL
Anesthesia: Monitor Anesthesia Care

## 2020-07-08 MED ORDER — BUTAMBEN-TETRACAINE-BENZOCAINE 2-2-14 % EX AERO
INHALATION_SPRAY | CUTANEOUS | Status: DC | PRN
  Administered 2020-07-08: 2 via TOPICAL

## 2020-07-08 MED ORDER — DEXTROSE 50 % IV SOLN
1.0000 | Freq: Once | INTRAVENOUS | Status: AC
  Administered 2020-07-08: 50 mL via INTRAVENOUS

## 2020-07-08 MED ORDER — DEXTROSE 50 % IV SOLN
INTRAVENOUS | Status: AC
  Administered 2020-07-08: 50 mL
  Filled 2020-07-08: qty 50

## 2020-07-08 MED ORDER — DARBEPOETIN ALFA 40 MCG/0.4ML IJ SOSY
40.0000 ug | PREFILLED_SYRINGE | Freq: Once | INTRAMUSCULAR | Status: AC
  Administered 2020-07-08: 40 ug via SUBCUTANEOUS
  Filled 2020-07-08 (×2): qty 0.4

## 2020-07-08 MED ORDER — FUROSEMIDE 10 MG/ML IJ SOLN
80.0000 mg | Freq: Once | INTRAMUSCULAR | Status: AC
  Administered 2020-07-08: 80 mg via INTRAVENOUS
  Filled 2020-07-08: qty 8

## 2020-07-08 MED ORDER — LIDOCAINE 2% (20 MG/ML) 5 ML SYRINGE
INTRAMUSCULAR | Status: DC | PRN
  Administered 2020-07-08: 100 mg via INTRAVENOUS

## 2020-07-08 MED ORDER — DEXTROSE 50 % IV SOLN
INTRAVENOUS | Status: AC
  Filled 2020-07-08: qty 50

## 2020-07-08 MED ORDER — METOLAZONE 5 MG PO TABS
5.0000 mg | ORAL_TABLET | ORAL | Status: AC
  Administered 2020-07-08: 5 mg via ORAL
  Filled 2020-07-08: qty 1

## 2020-07-08 MED ORDER — DEXTROSE 5 % IV SOLN: INTRAVENOUS | Status: AC

## 2020-07-08 MED ORDER — PROPOFOL 500 MG/50ML IV EMUL
INTRAVENOUS | Status: DC | PRN
  Administered 2020-07-08: 120 ug/kg/min via INTRAVENOUS

## 2020-07-08 NOTE — CV Procedure (Addendum)
Procedure: TEE  Sedation: Per anesthesiology  Indication: ?TAVR valve abnormality with elevated mean gradient  Findings: Please see echo section for full report.  Normal LV size with mild LV hypertrophy.  EF 55%.  Normal wall motion.  Normal RV size and systolic function.  Mild left atrial enlargement, no LA appendage thrombus.  Normal right atrium.  No PFO/ASD by color doppler.  Trivial TR, peak RV-RA gradient 63 mmHg.  Calcified mitral valve, especially posterior leaflet.  There is a 1.4 cm calcified nodule attached to the posterior leaflet (seen on prior studies).  This may be sequelae of remote endocarditis.  There is mild to moderate mitral regurgitation and mild mitral stenosis with mean gradient 5 mmHg but short pressure halftime.  There was a bioprosthetic aortic valve s/p TAVR.  The valve visually opens normally.  Mean gradient 29 mmHg across the aortic valve, no aortic insufficiency.  Normal caliber thoracic aorta with mild plaque. Moderate pericardial effusion, no tamponade.   Impression: The TAVR valve appears to open relatively normally, but has a significantly elevated mean gradient (29 mmHg).  The valve is relatively small and also suspect volume overload with high flow. The valve does not appear thrombosed.   [name] 07/08/2020 3:01 PM

## 2020-07-08 NOTE — Progress Notes (Signed)
  Echocardiogram Echocardiogram Transesophageal has been performed.  Fidel Levy 07/08/2020, 3:14 PM

## 2020-07-08 NOTE — Progress Notes (Signed)
PROGRESS NOTE    [name]  EXH:[id number] DOB: Sep 09, 1950 DOA: 07/02/2020 PCP: Alison Norlander, DO    Brief Narrative:  [name] is a 69 year old female with past medical history significant for type 2 diabetes mellitus, OSA, left ear s/p TAVR, obesity s/p gastric sleeve, CAD, essential hypertension, hyperlipidemia, stage IV CKD, chronic diastolic CHF, A. fib on Eliquis who was admitted on 07/02/2020 from a CHF clinic for acute on chronic diastolic congestive heart failure and questionable left lower extremity cellulitis.  Hospital service was consulted for possible left lower extremity cellulitis, with failed outpatient treatment with Keflex.   Assessment & [name]:   Principal Problem:   Acute on chronic diastolic heart failure (HCC) Active Problems:   Hyperlipidemia associated with type 2 diabetes mellitus (HCC)   Benign essential HTN   Coronary artery disease, non-occlusive   CKD stage 3 due to type 2 diabetes mellitus (HCC)   Cellulitis of left lower leg   Left lower extremity stasis dermatitis with ulcerations vs cellulitis Etiology likely suspicious for stasis dermatitis versus peripheral vascular disease.  Patient completed 5-day course of IV vancomycin.  Patient is afebrile without leukocytosis.  Left lower extremity vascular duplex arterial ultrasound 11/22 with popliteal artery appears occluded, distal PT and AT appear occluded unable to visualize bypass graft with possible collateral noted at distal femoral artery.  Patient has undergone a previous left lower extremity bypass graft 1 year ago that appears occluded.  Seen by vascular surgery, [name] on 11/23.  This appears to be a chronic and not an acute occlusion.  Patient follows with vascular surgery at [facility].  Given that they are heavily involved in her care, vascular surgery does not see any acute indication for arteriography or further work-up at this time and recommend follow-up with her  primary vascular surgeon at [facility] following discharge. --continue Vascepa 2g PO BID  Acute on chronic diastolic congestive heart failure, decompensated BNP elevated 116.6 on admission.  TTE 07/07/2020 with LVEF 60 to 65%, no regional wall motion normalities, mild concentric LVH, moderate pericardial effusion without evidence of tamponade, mild mitral stenosis, IVC dilated in size.  TEE 07/08/2020 with LVEF 55%, mild LVH, LA mildly dilated, 1.4 cm calcified nodule posterior leaflet mitral valve, mild/moderate MR, no PFO, normal-appearing bioprosthetic aortic valve. 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 x1 and furosemide 80 mg IV x1 today --Strict I's and O's and daily weights --Continue to monitor renal function closely daily with electrolytes  Paroxysmal atrial fibrillation --Amiodarone 200 mg p.o. daily --Eliquis 5 mg p.o. twice daily  Essential pretension BP 113/52 this morning, well controlled. --Hydralazine 50 mg TID  Hyperlipidemia --On fenofibrate 160 mg p.o. daily, nephrology recommends discontinuation given renal function --continue vascepa 2g PO BID  Type 2 diabetes mellitus Hemoglobin A1c 10.2 on 01/16/2020. Home regimen includes NPH 55 units with breakfast and lunch, 20 units with dinner.  Patient with hypoglycemic episode early this morning, likely secondary to continued insulin treatment in the setting of n.p.o. status.  Received several rounds of D50. --Start D5W at 75 mL's per hour while continued n.p.o. --Discontinue NPH for now while n.p.o. --Insulin sliding scale for further coverage --Continue close monitoring of CBGs every 4 hours --Update hemoglobin A1c in the a.m.  AKI on CKD stage IV Baseline creatinine 2.8-3.0.  Follows with [name], nephrology outpatient. --Nephrology following, appreciate assistance --Cr  3.54>3.49>3.03>3.12>3.37>3.72>3.21 --Receiving  metolazone and furosemide 80 mg IV x1 today --Avoid nephrotoxins, renally dose all medications --Strict I's notes --Monitor renal function closely daily  Anemia of chronic kidney disease Iron 78, TIBC 468, ferritin 30, folate 11.8, B12 513.  FOBT positive.  Evaluated by [facility], [name] on 07/03/2020 with recommendations of increased Protonix to 40 mg twice daily, oral iron therapy and no indication for endoscopic procedures at this time. --s/p IV Feraheme 11/21 --s/p 1u PRBC on 11/23 --Hgb 8.2>9.9>9.9>9.5>7.8 --Continue Protonix 40 mg p.o. twice daily --Ferrous sulfate 325 mg p.o. twice daily --Outpatient follow-up with GI, [name]  Obesity Body mass index is 31.64 kg/m.  Discussed need for lifestyle changes/weight loss as this complicates all facets of care.  Weakness, deconditioning, debility: --PT/OT evaluation    DVT prophylaxis: Eliquis Code Status: Full code Family Communication: No family present at bedside this morning  Disposition [name]: Per primary, cardiology, heart failure service   Consultants:   Hospitalist  Nephrology  Vascular surgery  Procedures:   Right heart catheterization 11/22  Vascular ultrasound duplex lower extremity arterial 11/22  TTE 11/24  TEE 11/24:  Antimicrobials:   Vancomycin 11/18 - 11/23   Subjective: Patient seen and examined bedside, resting comfortably.  Hypoglycemic episode this morning, received amp of D50.  Awaiting TEE later this morning.  No other complaints or concerns at this time.  Denies headache, no visual changes, no chest pain, no palpitations, no shortness of breath, no abdominal pain.  No other concerns other than hyperglycemia per nursing staff this morning.  Objective: Vitals:   07/08/20 0407 07/08/20 1000 07/08/20 1300 07/08/20 1325  BP:  (!) 157/60 (!) 137/37 (!) 137/37  Pulse:  79 80 80  Resp: 20 12 12 12   Temp: (!) 97.4 F (36.3 C) (!) 97 F  (36.1 C) 97.7 F (36.5 C) 97.7 F (36.5 C)  TempSrc: Oral Tympanic Tympanic Tympanic  SpO2:  93% 98% 98%  Weight:  88.9 kg 88.9 kg   Height:  5\' 6"  (1.676 m) 5\' 6"  (1.676 m)     Intake/Output Summary (Last 24 hours) at 07/08/2020 1457 Last data filed at 07/08/2020 0717 Gross per 24 hour  Intake 556 ml  Output 300 ml  Net 256 ml   Filed Weights   07/07/20 0410 07/08/20 1000 07/08/20 1300  Weight: 92.7 kg 88.9 kg 88.9 kg    Examination:  General exam: Appears calm and comfortable  Respiratory system: Clear to auscultation. Respiratory effort normal. Cardiovascular system: S1 & S2 heard, RRR. No JVD, murmurs, rubs, gallops or clicks. No pedal edema. Gastrointestinal system: Abdomen is nondistended, soft and nontender. No organomegaly or masses felt. Normal bowel sounds heard. Central nervous system: Alert and oriented. No focal neurological deficits. Extremities: Symmetric 5 x 5 power. Skin: No rashes, lesions or ulcers Psychiatry: Judgement and insight appear normal. Mood & affect appropriate.     Data Reviewed: I have personally reviewed following labs and imaging studies  CBC: Recent Labs  Lab 07/02/20 1628 07/02/20 1628 07/03/20 0248 07/03/20 0248 07/04/20 0258 07/04/20 0258 07/05/20 0258 07/05/20 0258 07/06/20 0320 07/06/20 1003 07/06/20 1004 07/06/20 1007 07/07/20 0204  WBC 7.2   < > 8.3  --  8.4  --  10.5  --  8.7  --   --   --  8.9  NEUTROABS 6.1  --   --   --   --   --   --   --   --   --   --   --   --  HGB 7.9*   < > 7.4*   < > 7.7*   < > 7.9*   < > 8.2* 9.9* 9.9* 9.5* 7.8*  HCT 27.6*   < > 26.1*   < > 27.2*   < > 27.5*   < > 28.3* 29.0* 29.0* 28.0* 27.4*  MCV 73.4*   < > 73.3*  --  72.9*  --  73.7*  --  73.9*  --   --   --  73.9*  PLT 476*   < > 414*  --  409*  --  407*  --  374  --   --   --  342   < > = values in this interval not displayed.   Basic Metabolic Panel: Recent Labs  Lab 07/02/20 1628 07/03/20 0248 07/04/20 0258 07/04/20 0258  07/05/20 0258 07/05/20 0258 07/06/20 0320 07/06/20 0320 07/06/20 1003 07/06/20 1004 07/06/20 1007 07/07/20 0204 07/08/20 0912  NA 136   < > 137   < > 137   < > 135   < > 138 138 139 135 139  K 3.9   < > 3.7   < > 3.8   < > 4.1   < > 3.9 3.9 3.9 3.9 3.7  CL 97*   < > 98  --  100  --  98  --   --   --   --  97* 100  CO2 27   < > 28  --  28  --  23  --   --   --   --  25 28  GLUCOSE 373*   < > 84  --  124*  --  188*  --   --   --   --  240* 97  BUN 58*   < > 55*  --  51*  --  58*  --   --   --   --  61* 58*  CREATININE 3.54*   < > 3.03*  --  3.12*  --  3.37*  --   --   --   --  3.72* 3.21*  CALCIUM 8.7*   < > 8.5*  --  8.3*  --  8.4*  --   --   --   --  8.3* 8.6*  MG 2.2  --   --   --   --   --   --   --   --   --   --   --   --    < > = values in this interval not displayed.   GFR: Estimated Creatinine Clearance: 18.6 mL/min (A) (by C-G formula based on SCr of 3.21 mg/dL (H)). Liver Function Tests: Recent Labs  Lab 07/02/20 1628  AST 49*  ALT 36  ALKPHOS 45  BILITOT 1.1  PROT 6.3*  ALBUMIN 2.7*   No results for input(s): LIPASE, AMYLASE in the last 168 hours. No results for input(s): AMMONIA in the last 168 hours. Coagulation Profile: No results for input(s): INR, PROTIME in the last 168 hours. Cardiac Enzymes: No results for input(s): CKTOTAL, CKMB, CKMBINDEX, TROPONINI in the last 168 hours. BNP (last 3 results) No results for input(s): PROBNP in the last 8760 hours. HbA1C: No results for input(s): HGBA1C in the last 72 hours. CBG: Recent Labs  Lab 07/08/20 0743 07/08/20 0823 07/08/20 1128 07/08/20 1230 07/08/20 1327  GLUCAP 42* 129* 54* 105* 84   Lipid Profile: No results for input(s): CHOL, HDL, LDLCALC, TRIG,  CHOLHDL, LDLDIRECT in the last 72 hours. Thyroid Function Tests: No results for input(s): TSH, T4TOTAL, FREET4, T3FREE, THYROIDAB in the last 72 hours. Anemia Panel: No results for input(s): VITAMINB12, FOLATE, FERRITIN, TIBC, IRON, RETICCTPCT in the  last 72 hours. Sepsis Labs: No results for input(s): PROCALCITON, LATICACIDVEN in the last 168 hours.  Recent Results (from the past 240 hour(s))  Culture, blood (routine x 2)     Status: None   Collection Time: 07/02/20  4:28 PM   Specimen: BLOOD  Result Value Ref Range Status   Specimen Description BLOOD RIGHT ANTECUBITAL  Final   Special Requests   Final    BOTTLES DRAWN AEROBIC AND ANAEROBIC Blood Culture adequate volume   Culture   Final    NO GROWTH 5 DAYS Performed at [facility], [address], [place], [place] [address]    Report Status 07/07/2020 FINAL  Final  Culture, blood (routine x 2)     Status: None   Collection Time: 07/02/20  4:31 PM   Specimen: BLOOD  Result Value Ref Range Status   Specimen Description BLOOD LEFT ANTECUBITAL  Final   Special Requests   Final    BOTTLES DRAWN AEROBIC AND ANAEROBIC Blood Culture adequate volume   Culture   Final    NO GROWTH 5 DAYS Performed at [facility], [place] [address] [place], [place] [address]    Report Status 07/07/2020 FINAL  Final  Respiratory Panel by RT PCR (Flu A&B, Covid) - Nasopharyngeal Swab     Status: None   Collection Time: 07/02/20  4:37 PM   Specimen: Nasopharyngeal Swab; Nasopharyngeal(NP) swabs in vial transport medium  Result Value Ref Range Status   SARS Coronavirus 2 by RT PCR NEGATIVE NEGATIVE Final    Comment: (NOTE) SARS-CoV-2 target nucleic acids are NOT DETECTED.  The SARS-CoV-2 RNA is generally detectable in upper respiratoy specimens during the acute phase of infection. The lowest concentration of SARS-CoV-2 viral copies this assay can detect is 131 copies/mL. A negative result does not preclude SARS-Cov-2 infection and should not be used as the sole basis for treatment or other patient management decisions. A negative result may occur with  improper specimen collection/handling, submission of specimen other than nasopharyngeal swab, presence of viral mutation(s) within  the areas targeted by this assay, and inadequate number of viral copies (<131 copies/mL). A negative result must be combined with clinical observations, patient history, and epidemiological information. The expected result is Negative.  Fact Sheet for Patients:  PinkCheek.be  Fact Sheet for Healthcare Providers:  GravelBags.it  This test is no t yet approved or cleared by the [place] FDA and  has been authorized for detection and/or diagnosis of SARS-CoV-2 by FDA under an Emergency Use Authorization (EUA). This EUA will remain  in effect (meaning this test can be used) for the duration of the COVID-19 declaration under Section 564(b)(1) of the Act, 21 U.S.C. section 360bbb-3(b)(1), unless the authorization is terminated or revoked sooner.     Influenza A by PCR NEGATIVE NEGATIVE Final   Influenza B by PCR NEGATIVE NEGATIVE Final    Comment: (NOTE) The Xpert Xpress SARS-CoV-2/FLU/RSV assay is intended as an aid in  the diagnosis of influenza from Nasopharyngeal swab specimens and  should not be used as a sole basis for treatment. Nasal washings and  aspirates are unacceptable for Xpert Xpress SARS-CoV-2/FLU/RSV  testing.  Fact Sheet for Patients: PinkCheek.be  Fact Sheet for Healthcare Providers: GravelBags.it  This test is  not yet approved or cleared by the [place] and  has been authorized for detection and/or diagnosis of SARS-CoV-2 by  FDA under an Emergency Use Authorization (EUA). This EUA will remain  in effect (meaning this test can be used) for the duration of the  Covid-19 declaration under Section 564(b)(1) of the Act, 21  U.S.C. section 360bbb-3(b)(1), unless the authorization is  terminated or revoked. Performed at [facility], [place] [address] [place], [place] [address]          Radiology Studies: ECHOCARDIOGRAM  COMPLETE  Result Date: 07/07/2020    ECHOCARDIOGRAM REPORT   Patient Name:   BONNELL PLACZEK Date of Exam: 07/07/2020 Medical Rec #:  [id number]        Height:       66.0 in Accession #:    [id number]       Weight:       204.4 lb Date of Birth:  [birth year]        BSA:          2.018 m Patient Age:    18 years         BP:           132/55 mmHg Patient Gender: F                HR:           75 bpm. Exam Location:  Inpatient Procedure: 2D Echo Indications:    CHF-Acute I50.31  History:        Patient has prior history of Echocardiogram examinations, most                 recent 03/12/2020. Risk Factors:Dyslipidemia and Hypertension.  Sonographer:    Mikki Santee RDCS (AE) Referring Phys: [place]  1. Left ventricular ejection fraction, by estimation, is 60 to 65%. The left ventricle has normal function. The left ventricle has no regional wall motion abnormalities. There is mild concentric left ventricular hypertrophy. Diastolic function could not  be assesed due to absence of atrial contraction (junctional rhythm?).  2. Right ventricular systolic function is normal. The right ventricular size is normal. There is severely elevated pulmonary artery systolic pressure. The estimated right ventricular systolic pressure is 00.7 mmHg.  3. Left atrial size was mildly dilated.  4. Moderate pericardial effusion. The pericardial effusion is circumferential. There is no evidence of cardiac tamponade.  5. The mitral valve is degenerative. Mild mitral valve regurgitation. Mild mitral stenosis. The mean mitral valve gradient is 5.0 mmHg. Moderate mitral annular calcification.  6. Gradients across the TAVR prosthesis have increased and dimensionless index has worsened compared to 12/10/2019. The aortic valve has been repaired/replaced. Aortic valve regurgitation is not visualized. Aortic valve mean gradient measures 23.0 mmHg.  Aortic valve Vmax measures 3.03 m/s.  7. The inferior vena cava is dilated in  size with <50% respiratory variability, suggesting right atrial pressure of 15 mmHg. Comparison(s): TAVR gradients have increased and this is not related to increased cardiac output. Consider TEE if clinically indicated, for example if prosthetic leaflet thrombosis is suspected. FINDINGS  Left Ventricle: Left ventricular ejection fraction, by estimation, is 60 to 65%. The left ventricle has normal function. The left ventricle has no regional wall motion abnormalities. The left ventricular internal cavity size was normal in size. There is  mild concentric left ventricular hypertrophy. Left ventricular diastolic function could not be evaluated due to nondiagnostic images. Diastolic function could not be assesed  due to absence of atrial contraction (junctional rhythm?). Right Ventricle: The right ventricular size is normal. No increase in right ventricular wall thickness. Right ventricular systolic function is normal. There is severely elevated pulmonary artery systolic pressure. The tricuspid regurgitant velocity is 3.70 m/s, and with an assumed right atrial pressure of 8 mmHg, the estimated right ventricular systolic pressure is 62.3 mmHg. Left Atrium: Left atrial size was mildly dilated. Right Atrium: Right atrial size was normal in size. Pericardium: A moderately sized pericardial effusion is present. The pericardial effusion is circumferential. There is no evidence of cardiac tamponade. Mitral Valve: The mitral valve is degenerative in appearance. Moderate mitral annular calcification. Mild mitral valve regurgitation, with centrally-directed jet. Mild mitral valve stenosis. MV peak gradient, 19.5 mmHg. The mean mitral valve gradient is 5.0 mmHg with average heart rate of 73 bpm. Tricuspid Valve: The tricuspid valve is normal in structure. Tricuspid valve regurgitation is mild. Aortic Valve: Gradients across the TAVR prosthesis have increased and dimensionless index has worsened compared to 12/10/2019. The aortic  valve has been repaired/replaced. Aortic valve regurgitation is not visualized. Aortic valve mean gradient measures 23.0 mmHg. Aortic valve peak gradient measures 36.7 mmHg. Aortic valve area, by VTI measures 1.15 cm. There is a 23 mm Sapien prosthetic, stented (TAVR) valve present in the aortic position. Pulmonic Valve: The pulmonic valve was not well visualized. Pulmonic valve regurgitation is not visualized. Aorta: The aortic root is normal in size and structure. Venous: The inferior vena cava is dilated in size with less than 50% respiratory variability, suggesting right atrial pressure of 15 mmHg. IAS/Shunts: No atrial level shunt detected by color flow Doppler.  LEFT VENTRICLE PLAX 2D LVIDd:         4.50 cm  Diastology LVIDs:         2.90 cm  LV e' lateral:   6.05 cm/s LV PW:         1.30 cm  LV E/e' lateral: 33.6 LV IVS:        1.30 cm LVOT diam:     2.10 cm LV SV:         68 LV SV Index:   34 LVOT Area:     3.46 cm  LEFT ATRIUM             Index       RIGHT ATRIUM           Index LA diam:        4.30 cm 2.13 cm/m  RA Area:     13.80 cm LA Vol (A2C):   64.7 ml 32.06 ml/m RA Volume:   27.90 ml  13.82 ml/m LA Vol (A4C):   51.7 ml 25.62 ml/m LA Biplane Vol: 58.7 ml 29.08 ml/m  AORTIC VALVE AV Area (Vmax):    1.08 cm AV Area (Vmean):   1.02 cm AV Area (VTI):     1.15 cm AV Vmax:           303.00 cm/s AV Vmean:          233.000 cm/s AV VTI:            0.591 m AV Peak Grad:      36.7 mmHg AV Mean Grad:      23.0 mmHg LVOT Vmax:         94.50 cm/s LVOT Vmean:        68.700 cm/s LVOT VTI:          0.196 m LVOT/AV VTI ratio: 0.33  AORTA Ao Root diam: 2.70 cm MITRAL VALVE                TRICUSPID VALVE MV Area (PHT): 3.03 cm     TR Peak grad:   54.8 mmHg MV Peak grad:  19.5 mmHg    TR Vmax:        370.00 cm/s MV Mean grad:  5.0 mmHg MV Vmax:       2.21 m/s     SHUNTS MV Vmean:      94.0 cm/s    Systemic VTI:  0.20 m MV PHT:        73.97 msec   Systemic Diam: 2.10 cm MV Decel Time: 250 msec MV E velocity:  203.00 cm/s MV A velocity: 62.80 cm/s MV E/A ratio:  3.23 Mihai Croitoru MD Electronically signed by Sanda Klein MD Signature Date/Time: 07/07/2020/3:33:20 PM    Final    VAS [place] LOWER EXTREMITY ARTERIAL DUPLEX  Result Date: 07/06/2020 LOWER EXTREMITY ARTERIAL DUPLEX STUDY Indications: Rest pain, peripheral artery disease, and Cellulitis. High Risk Factors: Hypertension, hyperlipidemia. Other Factors: TAVR.  Vascular Interventions: . left distal SFA-TP trunk bypass and left TMA in 2/21. Current ABI:            ABI done 04/23/20 at outside facility Limitations: patient unable to turn supine secondary to pain in leg. Constant              left lower extremity spasms Comparison Study: Prior ABI done 04/23/20 Performing Technologist: Sharion Dove RVS  Examination Guidelines: A complete evaluation includes B-mode imaging, spectral Doppler, color Doppler, and power Doppler as needed of all accessible portions of each vessel. Bilateral testing is considered an integral part of a complete examination. Limited examinations for reoccurring indications may be performed as noted.  +----------+--------+-----+--------+-----------+---------------+ LEFT      PSV cm/sRatioStenosisWaveform   Comments        +----------+--------+-----+--------+-----------+---------------+ CFA Distal105                  multiphasic                +----------+--------+-----+--------+-----------+---------------+ DFA       35                                              +----------+--------+-----+--------+-----------+---------------+ SFA Prox  80                                              +----------+--------+-----+--------+-----------+---------------+ SFA Mid   86                                              +----------+--------+-----+--------+-----------+---------------+ SFA Distal31                              thumping signal +----------+--------+-----+--------+-----------+---------------+ POP Prox   9            occluded                           +----------+--------+-----+--------+-----------+---------------+ ATA Distal  occluded                           +----------+--------+-----+--------+-----------+---------------+ PTA Distal             occluded                           +----------+--------+-----+--------+-----------+---------------+  Summary: Left: Limited study. Possible collateral noted at distal femoral artery. Popliteal artery appears occluded. Distal PT and AT appear occluded. Unable to visualize bypass graft.  See table(s) above for measurements and observations. Electronically signed by Monica Martinez MD on 07/06/2020 at 4:59:12 PM.    Final         Scheduled Meds: . [MAR Hold] sodium chloride   Intravenous Once  . [MAR Hold] amiodarone  200 mg Oral Daily  . [MAR Hold] apixaban  5 mg Oral BID  . [MAR Hold] darbepoetin (ARANESP) injection - NON-DIALYSIS  40 mcg Subcutaneous Once  . [MAR Hold] docusate sodium  100 mg Oral BID  . [MAR Hold] fenofibrate  160 mg Oral Daily  . [MAR Hold] ferrous sulfate  325 mg Oral BID WC  . [MAR Hold] furosemide  80 mg Intravenous Once  . [MAR Hold] hydrALAZINE  50 mg Oral TID  . [MAR Hold] icosapent Ethyl  2 g Oral BID  . [MAR Hold] insulin aspart  0-9 Units Subcutaneous TID WC  . [MAR Hold] metolazone  5 mg Oral NOW  . [MAR Hold] pantoprazole  40 mg Oral BID  . [MAR Hold] sodium chloride flush  3 mL Intravenous Q12H  . [MAR Hold] Tafamidis  61 mg Oral Daily   Continuous Infusions: . sodium chloride    . dextrose 75 mL/hr at 07/08/20 0912     LOS: 6 days    Time spent: 39 minutes spent on chart review, discussion with nursing staff, consultants, updating family and interview/physical exam; more than 50% of that time was spent in counseling and/or coordination of care.    Yulitza Shorts J [place] DO [facility] Available via Epic secure chat 7am-7pm After these hours, please refer to coverage  provider listed on [email or web address] 07/08/2020, 2:57 PM

## 2020-07-08 NOTE — Interval H&P Note (Signed)
History and Physical Interval Note:  07/08/2020 2:25 PM  [name]  has presented today for surgery, with the diagnosis of tricuspid valve.  The various methods of treatment have been discussed with the patient and family. After consideration of risks, benefits and other options for treatment, the patient has consented to  Procedure(s): TRANSESOPHAGEAL ECHOCARDIOGRAM (TEE) (N/A) as a surgical intervention.  The patient's history has been reviewed, patient examined, no change in status, stable for surgery.  I have reviewed the patient's chart and labs.  Questions were answered to the patient's satisfaction.     Hilberto Burzynski [employer]

## 2020-07-08 NOTE — Anesthesia Preprocedure Evaluation (Addendum)
Anesthesia Evaluation  Patient identified by MRN, date of birth, ID band Patient awake    Reviewed: Allergy & Precautions, NPO status , Patient's Chart, lab work & pertinent test results  History of Anesthesia Complications (+) DIFFICULT AIRWAY  Airway Mallampati: IV  TM Distance: >3 FB Neck ROM: Limited  Mouth opening: Limited Mouth Opening  Dental no notable dental hx. (+) Teeth Intact, Dental Advisory Given   Pulmonary sleep apnea ,    Pulmonary exam normal breath sounds clear to auscultation       Cardiovascular hypertension, + CAD, + Past MI, + Peripheral Vascular Disease and +CHF  Normal cardiovascular exam+ dysrhythmias Atrial Fibrillation + Valvular Problems/Murmurs (s/p TAVR) AS and MR  Rhythm:Regular Rate:Normal  TTE 06/2020 1. Left ventricular ejection fraction, by estimation, is 60 to 65%. The  left ventricle has normal function. The left ventricle has no regional  wall motion abnormalities. There is mild concentric left ventricular  hypertrophy. Diastolic function could not  be assesed due to absence of atrial contraction (junctional rhythm?).  2. Right ventricular systolic function is normal. The right ventricular  size is normal. There is severely elevated pulmonary artery systolic  pressure. The estimated right ventricular systolic pressure is 71.6 mmHg.  3. Left atrial size was mildly dilated.  4. Moderate pericardial effusion. The pericardial effusion is  circumferential. There is no evidence of cardiac tamponade.  5. The mitral valve is degenerative. Mild mitral valve regurgitation.  Mild mitral stenosis. The mean mitral valve gradient is 5.0 mmHg. Moderate  mitral annular calcification.  6. Gradients across the TAVR prosthesis have increased and dimensionless  index has worsened compared to 12/10/2019. The aortic valve has been  repaired/replaced. Aortic valve regurgitation is not visualized. Aortic   valve mean gradient measures 23.0 mmHg.  Aortic valve Vmax measures 3.03 m/s.  7. The inferior vena cava is dilated in size with <50% respiratory  variability, suggesting right atrial pressure of 15 mmHg.   Comparison(s): TAVR gradients have increased and this is not related to  increased cardiac output. Consider TEE if clinically indicated, for  example if prosthetic leaflet thrombosis is suspected.   Neuro/Psych TIACVA negative psych ROS   GI/Hepatic Neg liver ROS, hiatal hernia, GERD  ,  Endo/Other  diabetes, Type 2, Insulin DependentMorbid obesity  Renal/GU Renal InsufficiencyRenal disease  negative genitourinary   Musculoskeletal negative musculoskeletal ROS (+)   Abdominal   Peds  Hematology  (+) Blood dyscrasia (Hgb 7.8), anemia ,   Anesthesia Other Findings   Reproductive/Obstetrics                           Anesthesia Physical Anesthesia Plan  ASA: III  Anesthesia Plan: MAC   Post-op Pain Management:    Induction: Intravenous  PONV Risk Score and Plan: 2 and Propofol infusion and Treatment may vary due to age or medical condition  Airway Management Planned: Natural Airway  Additional Equipment:   Intra-op Plan:   Post-operative Plan:   Informed Consent: I have reviewed the patients History and Physical, chart, labs and discussed the procedure including the risks, benefits and alternatives for the proposed anesthesia with the patient or authorized representative who has indicated his/her understanding and acceptance.     Dental advisory given  Plan Discussed with: CRNA  Anesthesia Plan Comments:         Anesthesia Quick Evaluation

## 2020-07-08 NOTE — Progress Notes (Signed)
Patient came down to endo for TEE but there was a delay in procedure. Dr Aundra Dubin tied up in cath lab, will bring patient back down in a couple of hours.

## 2020-07-08 NOTE — Anesthesia Postprocedure Evaluation (Signed)
Anesthesia Post Note  Patient: [name]  Procedure(s) Performed: TRANSESOPHAGEAL ECHOCARDIOGRAM (TEE) (N/A )     Patient location during evaluation: Endoscopy Anesthesia Type: MAC Level of consciousness: awake and alert Pain management: pain level controlled Vital Signs Assessment: post-procedure vital signs reviewed and stable Respiratory status: spontaneous breathing, nonlabored ventilation, respiratory function stable and patient connected to nasal cannula oxygen Cardiovascular status: stable and blood pressure returned to baseline Postop Assessment: no apparent nausea or vomiting Anesthetic complications: no   No complications documented.  Last Vitals:  Vitals:   07/08/20 1510 07/08/20 1520  BP: 140/62 (!) 138/59  Pulse: 90 87  Resp: 20 18  Temp:    SpO2: 92% 93%    Last Pain:  Vitals:   07/08/20 1520  TempSrc:   PainSc: 0-No pain                 Tavarious Freel,W. EDMOND

## 2020-07-08 NOTE — Transfer of Care (Signed)
Immediate Anesthesia Transfer of Care Note  Patient: [name]  Procedure(s) Performed: TRANSESOPHAGEAL ECHOCARDIOGRAM (TEE) (N/A )  Patient Location: Endoscopy Unit  Anesthesia Type:MAC  Level of Consciousness: drowsy  Airway & Oxygen Therapy: Patient Spontanous Breathing and Patient connected to nasal cannula oxygen  Post-op Assessment: Report given to RN and Post -op Vital signs reviewed and stable  Post vital signs: Reviewed and stable  Last Vitals:  Vitals Value Taken Time  BP    Temp    Pulse 92 07/08/20 1501  Resp 23 07/08/20 1501  SpO2 90% 07/08/20 1501  Vitals shown include unvalidated device data.  Last Pain:  Vitals:   07/08/20 1325  TempSrc: Tympanic  PainSc: 0-No pain      Patients Stated Pain Goal: 2 (75/91/63 8466)  Complications: No complications documented.

## 2020-07-08 NOTE — Progress Notes (Addendum)
Hypoglycemic Event  CBG: 0012 blood sugar 50           0336 blood sugar 52  Treatment: D50 25 mL (12.5 gm)  Symptoms: Hungry and Nervous/irritable  Follow-up CBG: 0105  CBG Result:113                   CBC: 0412 CBC result: 126  Possible Reasons for Event:NPO Comments/MD notified: Notified Dr. Marsh [name]

## 2020-07-08 NOTE — Progress Notes (Signed)
[facility] Progress Note  Name: [name] MRN: [id number] DOB: April 14, 1951  Chief Complaint:  Shortness of breath, leg swelling, fluid overload  Subjective:  No urine output is charted for 11/23; she's had 300 mL thus far 11/24.  charted as going down for TEE (to assess valve - pt s/p TAVR) but brought back up due to staffing and to go back down later per charting.  She got a unit of blood on 11/23.  Weight charted as 88.9 kg. 93% on room air this AM.  She can't breathe well when she lays down a certain way   Review of systems:  Has been NPO for procedure Team managing hypoglycemia this AM - getting dextrose Reports shortness of breath  Denies chest pain  No n/v Has to urinate now - having some urine output  ------ Background on consult:  [name] is a 69 y.o. female with a history including CKD stage IV, chronic diastolic CHF, type 2 diabetes mellitus, hypertension, A. fib, nonobstructive CAD, obesity s/p gastric sleeve, and severe aortic stenosis status post TAVR, and OSA not compliant with CPAP who presented with shortness of breath and volume overload.  She has been diuresed and Cr has risen as per labs below.  She follows with Dr. Joelyn Oms at [facility] for her CKD with recent labs from [facility] as below.  At the time of her visit she was on torsemide 20 mg twice a day and was kept on this dose.  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  Note also a history of symptomatic bradycardia related to amiodarone and carvedilol which improved with dose reduction of the amiodarone and stopping her carvedilol; she then had tachyarrhythmia requiring DCCV and this resolved per charting. Recent baseline Cr 2.8 though in October 2021, Cr 3.5 and 3.2.  [facility] med list includes olmesartan-amlodipine-HCTZ which is not on [facility] med list.  She most recently had torsemide 40 mg daily  from 11/20 and 11/21 then had lasix 80 mg IV x 2 doses on 11/22.  Lasix was held today.  She has been on room air.  Had 800 mL UOP over 11/22.  She got a dose of feraheme earlier this hospitalization.  She has been on vanc for LLE cellulitis and note this was just stopped per cardiology.  She had a right heart cath with elevated filling pressures and IV lasix was resumed after that.  Now is not ordered.  Note diagnosis of cardiac amyloidosis.   She states that her breathing is still complicated by a wheezing, nagging cough.  States she has also had a hard time recovering from RSV infection.  She states that her leg swelling is much better than when she was admitted.  She states that she was recently started on Bactrim for cellulitis as an outpatient   Intake/Output Summary (Last 24 hours) at 07/08/2020 1034 Last data filed at 07/08/2020 0717 Gross per 24 hour  Intake 556 ml  Output 300 ml  Net 256 ml    Vitals:  Vitals:   07/07/20 2130 07/07/20 2200 07/08/20 0407 07/08/20 1000  BP: (!) 122/52 (!) 113/52  (!) 157/60  Pulse: 71 70  79  Resp:  18 20 12   Temp:  98.6 F (37 C) (!) 97.4 F (36.3 C) (!) 97 F (36.1 C)  TempSrc:  Oral Oral Tympanic  SpO2:    93%  Weight:    88.9 kg  Height:    5\' 6"  (1.676 m)     Physical Exam:   General: Adult female in bed obese habitus no acute distress at rest HEENT: Normocephalic atraumatic Eyes: Extraocular movements intact sclera anicteric Neck: Increased neck circumference trachea midline Heart: S1-S2 no rub appreciated Lungs: Clear but reduced breath sounds on my exam.  She is on room air.  Unlabored at rest Abdomen: Softly distended/obese/nontender Extremities: Trace to 1+ edema bilateral lower extremities  Skin: Left foot is wrapped no rash on extremities exposed Neuro: Alert and oriented x3 provides a history and follows commands Psych normal mood and affect  Medications reviewed   Labs:  BMP Latest Ref Rng & Units 07/08/2020  07/07/2020 07/06/2020  Glucose 70 - 99 mg/dL 97 240(H) -  BUN 8 - 23 mg/dL 58(H) 61(H) -  Creatinine 0.44 - 1.00 mg/dL 3.21(H) 3.72(H) -  BUN/Creat Ratio 12 - 28 - - -  Sodium 135 - 145 mmol/L 139 135 139  Potassium 3.5 - 5.1 mmol/L 3.7 3.9 3.9  Chloride 98 - 111 mmol/L 100 97(L) -  CO2 22 - 32 mmol/L 28 25 -  Calcium 8.9 - 10.3 mg/dL 8.6(L) 8.3(L) -     Assessment/Plan:   # AKI - Pre-renal insults in setting of diuresis for acute CHF exacerbation however also with anemia and vanc use  - She is nearing the need for dialysis but has no acute need.  Cr is a bit better 11/24 - Addressing anemia as below to optimize renal recovery  - Will give metolazone 5 mg PO once and lasix 80 mg IV once today - Would not resume the reported home med of tribenzor - olmesartan-amlodipine-HCTZ unclear if she has actually been taking  - Would defer fenofibrate given her advanced CKD   # CKD stage IV  - Secondary to DM, HTN and CHF  - Baseline Cr 2.8 - 3 - Follows with Dr. Joelyn Oms at [facility]   # Acute on chronic diastolic CHF - Diuretics as above - She is on room air and reports marked improvement in leg swelling though still with a cough and shortness of breath; note hx of recent RSV as well   # Hypertension - acceptable control   # Anemia of CKD - hemoglobin down to 7.8. received IV iron earlier this hospitalization.  S/p PRBC on 11/23 - Start aranesp 40 mcg once on 11/24   - note CBC ordered for AM   # Left leg cellulitis - Status post vancomycin and previously on Bactrim as an outpatient - Would avoid Bactrim in advanced CKD and note that Vanc has been stopped  Claudia Desanctis, MD 07/08/2020 10:51 AM

## 2020-07-08 NOTE — Progress Notes (Signed)
Hypoglycemic Event  CBG: 0012 blood sugar 50           0336 blood sugar 52  Treatment: D50 25 mL (12.5 gm)  Symptoms: Hungry and Nervous/irritable  Follow-up CBG: 0105  CBG Result:113                   CBC: 0412 CBC result: 126  Possible Reasons for Event:NPO Comments/MD notified: Notified Dr. Marsh Dolly Trinna Balloon

## 2020-07-08 NOTE — Progress Notes (Addendum)
Advanced Heart Failure Rounding Note  PCP-Cardiologist: Minus Breeding, MD  [facility]: Dr. Aundra Dubin   Subjective:    Admitted with a/c diastolic heart failure and LLE cellulitis. Treated w/ IV Vanc. Blood cultures NGTD. Cellulitis resolved. Now off abx.    Had [place] this admit showing elevated R/LH filling pressures. IV Lasix resumed but developed worsening renal function. SCr up to 3.72 yesterday and diuretics held. SCr better today, down to 3.2. Wt not charted yet.   CBC pending.   Echo shows normal EF 60-65%. RV normal. Moderate pericardial effusion. Gradients across the TAVR prosthesis have increased and dimensionless index has worsened compared to 12/10/2019. Scheduled for TEE today.   Hypoglycemic this morning. Glucose down to 42. Given dextrose. Improved to 129.   Resting comfortably currently. Denies dyspnea.    RIGHT HEART CATH 07/06/20    Right Heart Pressures RHC Procedural Findings: Hemodynamics (mmHg) RA mean 17 RV 53/18 PA 56/25, mean 36 PCWP mean 27  Oxygen saturations: PA 48% AO 96%  Cardiac Output (Fick) 6.43  Cardiac Index (Fick) 3.22  PVR 1.4 WU   Conclusion  1. Elevated right and left heart filling pressures.  2. Pulmonary venous hypertension.    2D Echo 07/07/20 1. Left ventricular ejection fraction, by estimation, is 60 to 65%. The left ventricle has normal function. The left ventricle has no regional wall motion abnormalities. There is mild concentric left ventricular hypertrophy. Diastolic function could not be assesed due to absence of atrial contraction (junctional rhythm?). 2. Right ventricular systolic function is normal. The right ventricular size is normal. There is severely elevated pulmonary artery systolic pressure. The estimated right ventricular systolic pressure is 00.8 mmHg. 3. Left atrial size was mildly dilated. 4. Moderate pericardial effusion. The pericardial effusion is circumferential. There is no evidence of cardiac  tamponade. 5. The mitral valve is degenerative. Mild mitral valve regurgitation. Mild mitral stenosis. The mean mitral valve gradient is 5.0 mmHg. Moderate mitral annular calcification. 6. Gradients across the TAVR prosthesis have increased and dimensionless index has worsened compared to 12/10/2019. The aortic valve has been repaired/replaced. Aortic valve regurgitation is not visualized. Aortic valve mean gradient measures 23.0 mmHg. Aortic valve Vmax measures 3.03 m/s. 7. The inferior vena cava is dilated in size with <50% respiratory variability, suggesting right atrial pressure of 15 mmHg.  Objective:   Weight Range: 92.7 kg Body mass index is 32.99 kg/m.   Vital Signs:   Temp:  [97.4 F (36.3 C)-99.4 F (37.4 C)] 97.4 F (36.3 C) (11/24 0407) Pulse Rate:  [70-75] 70 (11/23 2200) Resp:  [16-20] 20 (11/24 0407) BP: (113-131)/(44-54) 113/52 (11/23 2200) SpO2:  [98 %] 98 % (11/23 1935) Last BM Date: 07/04/20  Weight change: Filed Weights   07/05/20 0425 07/06/20 0322 07/07/20 0410  Weight: 89.3 kg 89.8 kg 92.7 kg    Intake/Output:   Intake/Output Summary (Last 24 hours) at 07/08/2020 0910 Last data filed at 07/08/2020 0717 Gross per 24 hour  Intake 556 ml  Output 300 ml  Net 256 ml      Physical Exam    PHYSICAL EXAM: General:  Fatigue appearing. Laying in bed. Obese.No respiratory difficulty HEENT: normal Neck: supple. Thick neck,  JVD not well visualized Carotids 2+ bilat; no bruits. No lymphadenopathy or thyromegaly appreciated. Cor: PMI nondisplaced. Regular rate & rhythm. No rubs, gallops or murmurs. Lungs: clear Abdomen: soft, nontender, nondistended. No hepatosplenomegaly. No bruits or masses. Good bowel sounds. Extremities: no cyanosis, clubbing, rash, edema Neuro: alert & oriented  x 3, cranial nerves grossly intact. moves all 4 extremities w/o difficulty. Affect pleasant.   Telemetry   NSR 90s Personally reviewed  Labs    CBC Recent Labs     07/06/20 0320 07/06/20 1003 07/06/20 1007 07/07/20 0204  WBC 8.7  --   --  8.9  HGB 8.2*   < > 9.5* 7.8*  HCT 28.3*   < > 28.0* 27.4*  MCV 73.9*  --   --  73.9*  PLT 374  --   --  342   < > = values in this interval not displayed.   Basic Metabolic Panel Recent Labs    07/06/20 0320 07/06/20 1003 07/06/20 1007 07/07/20 0204  NA 135   < > 139 135  K 4.1   < > 3.9 3.9  CL 98  --   --  97*  CO2 23  --   --  25  GLUCOSE 188*  --   --  240*  BUN 58*  --   --  61*  CREATININE 3.37*  --   --  3.72*  CALCIUM 8.4*  --   --  8.3*   < > = values in this interval not displayed.   Liver Function Tests No results for input(s): AST, ALT, ALKPHOS, BILITOT, PROT, ALBUMIN in the last 72 hours. No results for input(s): LIPASE, AMYLASE in the last 72 hours. Cardiac Enzymes No results for input(s): CKTOTAL, CKMB, CKMBINDEX, TROPONINI in the last 72 hours.  BNP: BNP (last 3 results) Recent Labs    02/21/20 1151 06/03/20 1354 07/02/20 1628  BNP 538.2* 718.7* 1,102.7*    ProBNP (last 3 results) No results for input(s): PROBNP in the last 8760 hours.   D-Dimer No results for input(s): DDIMER in the last 72 hours. Hemoglobin A1C No results for input(s): HGBA1C in the last 72 hours. Fasting Lipid Panel No results for input(s): CHOL, HDL, LDLCALC, TRIG, CHOLHDL, LDLDIRECT in the last 72 hours. Thyroid Function Tests No results for input(s): TSH, T4TOTAL, T3FREE, THYROIDAB in the last 72 hours.  Invalid input(s): FREET3  Other results:   Imaging    ECHOCARDIOGRAM COMPLETE  Result Date: 07/07/2020    ECHOCARDIOGRAM REPORT   Patient Name:   [name] Date of Exam: 07/07/2020 Medical Rec #:  [id number]        Height:       66.0 in Accession #:    [id number]       Weight:       204.4 lb Date of Birth:  January 28, 1951        BSA:          2.018 m Patient Age:    69 years         BP:           132/55 mmHg Patient Gender: F                HR:           75 bpm. Exam Location:   Inpatient Procedure: 2D Echo Indications:    CHF-Acute I50.31  History:        Patient has prior history of Echocardiogram examinations, most                 recent 03/12/2020. Risk Factors:Dyslipidemia and Hypertension.  Sonographer:    Mikki Santee RDCS (AE) Referring Phys: [place]  1. Left ventricular ejection fraction, by estimation, is 60 to 65%. The left ventricle has  normal function. The left ventricle has no regional wall motion abnormalities. There is mild concentric left ventricular hypertrophy. Diastolic function could not  be assesed due to absence of atrial contraction (junctional rhythm?).  2. Right ventricular systolic function is normal. The right ventricular size is normal. There is severely elevated pulmonary artery systolic pressure. The estimated right ventricular systolic pressure is 51.0 mmHg.  3. Left atrial size was mildly dilated.  4. Moderate pericardial effusion. The pericardial effusion is circumferential. There is no evidence of cardiac tamponade.  5. The mitral valve is degenerative. Mild mitral valve regurgitation. Mild mitral stenosis. The mean mitral valve gradient is 5.0 mmHg. Moderate mitral annular calcification.  6. Gradients across the TAVR prosthesis have increased and dimensionless index has worsened compared to 12/10/2019. The aortic valve has been repaired/replaced. Aortic valve regurgitation is not visualized. Aortic valve mean gradient measures 23.0 mmHg.  Aortic valve Vmax measures 3.03 m/s.  7. The inferior vena cava is dilated in size with <50% respiratory variability, suggesting right atrial pressure of 15 mmHg. Comparison(s): TAVR gradients have increased and this is not related to increased cardiac output. Consider TEE if clinically indicated, for example if prosthetic leaflet thrombosis is suspected. FINDINGS  Left Ventricle: Left ventricular ejection fraction, by estimation, is 60 to 65%. The left ventricle has normal function. The left  ventricle has no regional wall motion abnormalities. The left ventricular internal cavity size was normal in size. There is  mild concentric left ventricular hypertrophy. Left ventricular diastolic function could not be evaluated due to nondiagnostic images. Diastolic function could not be assesed due to absence of atrial contraction (junctional rhythm?). Right Ventricle: The right ventricular size is normal. No increase in right ventricular wall thickness. Right ventricular systolic function is normal. There is severely elevated pulmonary artery systolic pressure. The tricuspid regurgitant velocity is 3.70 m/s, and with an assumed right atrial pressure of 8 mmHg, the estimated right ventricular systolic pressure is 25.8 mmHg. Left Atrium: Left atrial size was mildly dilated. Right Atrium: Right atrial size was normal in size. Pericardium: A moderately sized pericardial effusion is present. The pericardial effusion is circumferential. There is no evidence of cardiac tamponade. Mitral Valve: The mitral valve is degenerative in appearance. Moderate mitral annular calcification. Mild mitral valve regurgitation, with centrally-directed jet. Mild mitral valve stenosis. MV peak gradient, 19.5 mmHg. The mean mitral valve gradient is 5.0 mmHg with average heart rate of 73 bpm. Tricuspid Valve: The tricuspid valve is normal in structure. Tricuspid valve regurgitation is mild. Aortic Valve: Gradients across the TAVR prosthesis have increased and dimensionless index has worsened compared to 12/10/2019. The aortic valve has been repaired/replaced. Aortic valve regurgitation is not visualized. Aortic valve mean gradient measures 23.0 mmHg. Aortic valve peak gradient measures 36.7 mmHg. Aortic valve area, by VTI measures 1.15 cm. There is a 23 mm Sapien prosthetic, stented (TAVR) valve present in the aortic position. Pulmonic Valve: The pulmonic valve was not well visualized. Pulmonic valve regurgitation is not visualized.  Aorta: The aortic root is normal in size and structure. Venous: The inferior vena cava is dilated in size with less than 50% respiratory variability, suggesting right atrial pressure of 15 mmHg. IAS/Shunts: No atrial level shunt detected by color flow Doppler.  LEFT VENTRICLE PLAX 2D LVIDd:         4.50 cm  Diastology LVIDs:         2.90 cm  LV e' lateral:   6.05 cm/s LV PW:  1.30 cm  LV E/e' lateral: 33.6 LV IVS:        1.30 cm LVOT diam:     2.10 cm LV SV:         68 LV SV Index:   34 LVOT Area:     3.46 cm  LEFT ATRIUM             Index       RIGHT ATRIUM           Index LA diam:        4.30 cm 2.13 cm/m  RA Area:     13.80 cm LA Vol (A2C):   64.7 ml 32.06 ml/m RA Volume:   27.90 ml  13.82 ml/m LA Vol (A4C):   51.7 ml 25.62 ml/m LA Biplane Vol: 58.7 ml 29.08 ml/m  AORTIC VALVE AV Area (Vmax):    1.08 cm AV Area (Vmean):   1.02 cm AV Area (VTI):     1.15 cm AV Vmax:           303.00 cm/s AV Vmean:          233.000 cm/s AV VTI:            0.591 m AV Peak Grad:      36.7 mmHg AV Mean Grad:      23.0 mmHg LVOT Vmax:         94.50 cm/s LVOT Vmean:        68.700 cm/s LVOT VTI:          0.196 m LVOT/AV VTI ratio: 0.33  AORTA Ao Root diam: 2.70 cm MITRAL VALVE                TRICUSPID VALVE MV Area (PHT): 3.03 cm     TR Peak grad:   54.8 mmHg MV Peak grad:  19.5 mmHg    TR Vmax:        370.00 cm/s MV Mean grad:  5.0 mmHg MV Vmax:       2.21 m/s     SHUNTS MV Vmean:      94.0 cm/s    Systemic VTI:  0.20 m MV PHT:        73.97 msec   Systemic Diam: 2.10 cm MV Decel Time: 250 msec MV E velocity: 203.00 cm/s MV A velocity: 62.80 cm/s MV E/A ratio:  3.23 Mihai Croitoru MD Electronically signed by Sanda Klein MD Signature Date/Time: 07/07/2020/3:33:20 PM    Final      Medications:     Scheduled Medications: . sodium chloride   Intravenous Once  . amiodarone  200 mg Oral Daily  . apixaban  5 mg Oral BID  . docusate sodium  100 mg Oral BID  . fenofibrate  160 mg Oral Daily  . ferrous sulfate   325 mg Oral BID WC  . hydrALAZINE  50 mg Oral TID  . icosapent Ethyl  2 g Oral BID  . insulin aspart  0-9 Units Subcutaneous TID WC  . pantoprazole  40 mg Oral BID  . sodium chloride flush  3 mL Intravenous Q12H  . Tafamidis  61 mg Oral Daily    Infusions: . sodium chloride    . 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 - RHC this admit showed elevated R/LH filling pressures. RAP 17. PCWP 27. IV Lasix resumed post cath but developed worsening renal function w/ SCr up to 3.7. Diuretics subsequently held yesterday. SCr better today at 3.2.   - Per nephrology give 80 IV Lasix x 1 today + 5 mg of metolazone.   - follow BMP  2. CKD stage 3b:   - Creatinine baseline 2.6-2.8 - Creatinine on admit 3.5>3.5> 3.0 > 3.1>3.4>3.7>>3.2.    - nephrology following. Restart IV Lasix today + trial of metolazone - follow BMP  3. CAD: Cath in 8/19 with moderate RCA and LCx disease, severe distal LAD disease, medical management. - No s/s angina - No ASA given apixaban use.  - Continue pitivastatin 4 mg daily and Praluent.  - Continue fenofibrate and vascepa.  4. Atrial fibrillation: Paroxysmal.  - Remains in NSR - Eliquis held 11/19. Hgb 7.4 -> 7.7 -> 7.9->8.2->9.9 - Eliquis restarted. CBC pending   - Continue amiodarone 200 mg daily. - TSH 1.2.  5. SAY:[id number] on admit.  She is off Coreg with junctional bradycardia at 7/21 admission.  - She cannot take amlodipine due to peripheral edema formation.  - Hold off on ACEI/ARB with CKD.  -Continue hydralazine 50 mg three times a day.  - BP improved today. Continue to monitor  6. PAD: S/p PTCA to right popliteal and right AT in 7/20. Now with left  distal SFA-TP trunk bypass and left TMA in 2/21.  - She has followup with vascular surgery at [facility].  - Ordered peripheral arterial dopplers with LE ulcerations to see if there is worsening PAD. Limited study. Possible collateral noted at distal femoral artery. Popliteal artery appears occluded. Distal PT and AT appear occluded.  Unable to visualize bypass graft.  - [facility] has seen. Given that patient has extensive history with vascular surgery of [name] and that her left leg is not acutely ischemic, would recommend short order follow up with [facility] vascular surgery when medically stable for discharge home 7. 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  - She is on tafamidis, continue.   8. S/p TAVR: echo this admit shows gradients across the TAVR prosthesis have increased and dimensionless index has worsened compared to 12/10/2019.  - Plan TEE today to better assess the valve 9. Enterococcal endocarditis: Involving mitral valve, 7/21 TEE showed possible old calcified vegetation.  10. Hyperlipidemia: Lipids good on 8/21 panel, TGs controlled now. Continue current regimen.  11. Mitral valve disease: TEE in 7/21 with mild-moderate MR, mild MS.  12. GERD: - ContinueProtonix 40 mg daily. 13. LLE Cellulitis - Completed 2 rounds or oral antibiotics  PTA  - Blood cultures obtained 07/02/20. NGTD - completed course of IV Vanc, now resolved.  14. DMII - 04/2020 Hgb A1C 9.9  - BG low at 42 today. Got dextrose. Improved to 127 - TRH managing 15. Anemia , Iron deficient - Followed by Dr Fuller Plan Had planned EGD/Colonoscopy but this was postponed due to respiratory studies.  - Has not had oral iron in a week.  - Hgb down to 7.4 this admit . No obvious source of bleeding             - MCV persistently low. Given Feraheme 11/21 - Dr. Henrene Pastor has seen recommend outpatient f/u - Eliquis restarted. CBC for  today pending.  - continue to follow. Transfuse for hgb <7.0.  16. Debility - need to mobilize  Length of Stay: [address] PA-C  07/08/2020, 9:10 AM  Advanced Heart Failure Team Pager [phone] (M-F; [facility])  Please contact [facility] for night-coverage after hours (4p -7a ) and weekends on [email or web address]  Patient seen with PA, agree with the above note.   Diuretics held yesterday, creatinine lower at 3.21.  No CBC yet.  No overt bleeding.  She remains on Eliquis.   Echo yesterday showed elevated PA pressure, moderate pericardial effusion, and concern for possible thrombosis of the TAVR valve with elevated mean gradient across the bioprosthetic aortic valve.  TEE was done today, showing normal LV and RV EF, moderate pericardial effusion, elevated PA pressure.  The TAVR valve appeared to open well but has a significantly elevated mean gradient (29 mmHg). The valve is relatively small and also suspect volume overload with high flow. The valve does not appear thrombosed.   General: NAD Neck: Thick, JVP difficult, no thyromegaly or thyroid nodule.  Lungs: Clear to auscultation bilaterally with normal respiratory effort. CV: Nondisplaced PMI.  Heart regular S1/S2, no S3/S4, no murmur. Trace ankle edema.  No carotid bruit.  Normal pedal pulses.  Abdomen: Soft, nontender, no hepatosplenomegaly, no distention.  Skin: Intact without lesions or rashes.  Neurologic: Alert and oriented x 3.  Psych: Normal affect. Extremities: No clubbing or cyanosis.  HEENT: Normal.   Acute on chronic diastolic CHF, diuretics held yesterday with rise in creatinine but now down to 3.2. Exam difficult for volume but PCWP and RA pressure elevated on RHC and IVC dilated on 11/23 echo.  -Discussed with nephrology, will give metolazone 5 mg x 1 + Lasix 80 mg IV today.  If creatinine remains stable, continue to diurese.  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    H/o Fe deficiency  anemia, GI working up as outpatient but has not had scopes yet. Hgb down to 7.4 here initially. Has been FOBT positive.Seen by GI, not thought to have significant active bleeding, and they elected to defer inpatient endoscopy. She has had feraheme, no overt bleeding but hgb has remained low.  -Restarted Eliquis, follow hgb closely.  Still needs CBC today.   Concern initially for LLE cellulitis, has ulcerations LLE.Erythema LLE much improved with IV vancomycin.  - Now off abx.  - With ulcerations on LLE, arterial dopplers done showing occluded left popliteal and left AT/PT.  She has known PAD left leg and is s/p left TMA.  Vascular saw, think treatment can be deferred until she sees her outpatient vascular MD.     She is back an forth between accelerated junctional rhythm and NSR.  Never tachy.   Echo this admission was concerning for possible partial thrombosis of TAVR valve.  TEE done today showed that the valve is relatively small and mean gradient is 29 mmHg, but the valve does not appear restricted visually and opens well. Doubt partial thrombosis, think elevated gradient is due to a small TAVR valve + elevated flow with volume overload.   - Diuresis.  - She is anticoagulated.   [name] 07/08/2020 3:21 PM

## 2020-07-09 DIAGNOSIS — I5033 Acute on chronic diastolic (congestive) heart failure: Secondary | ICD-10-CM | POA: Diagnosis not present

## 2020-07-09 LAB — GLUCOSE, CAPILLARY
Glucose-Capillary: 54 mg/dL — ABNORMAL LOW (ref 70–99)
Glucose-Capillary: 155 mg/dL — ABNORMAL HIGH (ref 70–99)
Glucose-Capillary: 99 mg/dL (ref 70–99)
Glucose-Capillary: 170 mg/dL — ABNORMAL HIGH (ref 70–99)
Glucose-Capillary: 324 mg/dL — ABNORMAL HIGH (ref 70–99)
Glucose-Capillary: 375 mg/dL — ABNORMAL HIGH (ref 70–99)

## 2020-07-09 LAB — BASIC METABOLIC PANEL
Anion gap: 12 (ref 5–15)
BUN: 52 mg/dL — ABNORMAL HIGH (ref 8–23)
CO2: 29 mmol/L (ref 22–32)
Calcium: 8.5 mg/dL — ABNORMAL LOW (ref 8.9–10.3)
Chloride: 96 mmol/L — ABNORMAL LOW (ref 98–111)
Creatinine, Ser: 2.75 mg/dL — ABNORMAL HIGH (ref 0.44–1.00)
GFR, Estimated: 18 mL/min — ABNORMAL LOW (ref >60)
Glucose, Bld: 82 mg/dL (ref 70–99)
Potassium: 3.9 mmol/L (ref 3.5–5.1)
Sodium: 137 mmol/L (ref 135–145)

## 2020-07-09 LAB — CBC
HCT: 32 % — ABNORMAL LOW (ref 36.0–46.0)
Hemoglobin: 9.4 g/dL — ABNORMAL LOW (ref 12.0–15.0)
MCH: 22.8 pg — ABNORMAL LOW (ref 26.0–34.0)
MCHC: 29.4 g/dL — ABNORMAL LOW (ref 30.0–36.0)
MCV: 77.7 fL — ABNORMAL LOW (ref 80.0–100.0)
Platelets: 354 10*3/uL (ref 150–400)
RBC: 4.12 MIL/uL (ref 3.87–5.11)
RDW: 25.4 % — ABNORMAL HIGH (ref 11.5–15.5)
WBC: 8.6 10*3/uL (ref 4.0–10.5)
nRBC: 0.9 % — ABNORMAL HIGH (ref 0.0–0.2)

## 2020-07-09 LAB — HEMOGLOBIN A1C
Hgb A1c MFr Bld: 6.4 % — ABNORMAL HIGH (ref 4.8–5.6)
Mean Plasma Glucose: 136.98 mg/dL

## 2020-07-09 LAB — MAGNESIUM: Magnesium: 2.3 mg/dL (ref 1.7–2.4)

## 2020-07-09 MED ORDER — AMIODARONE LOAD VIA INFUSION
150.0000 mg | Freq: Once | INTRAVENOUS | Status: AC
  Administered 2020-07-09: 150 mg via INTRAVENOUS
  Filled 2020-07-09: qty 83.34

## 2020-07-09 MED ORDER — FUROSEMIDE 10 MG/ML IJ SOLN
80.0000 mg | Freq: Once | INTRAMUSCULAR | Status: AC
  Administered 2020-07-09: 80 mg via INTRAVENOUS
  Filled 2020-07-09: qty 8

## 2020-07-09 MED ORDER — FUROSEMIDE 10 MG/ML IJ SOLN
80.0000 mg | Freq: Once | INTRAMUSCULAR | Status: AC
  Administered 2020-07-10: 80 mg via INTRAVENOUS
  Filled 2020-07-09: qty 8

## 2020-07-09 MED ORDER — AMIODARONE HCL IN DEXTROSE 360-4.14 MG/200ML-% IV SOLN
60.0000 mg/h | INTRAVENOUS | Status: AC
  Administered 2020-07-09 (×2): 60 mg/h via INTRAVENOUS
  Filled 2020-07-09 (×3): qty 200

## 2020-07-09 MED ORDER — AMIODARONE HCL IN DEXTROSE 360-4.14 MG/200ML-% IV SOLN
30.0000 mg/h | INTRAVENOUS | Status: DC
  Administered 2020-07-09 – 2020-07-10 (×4): 30 mg/h via INTRAVENOUS
  Filled 2020-07-09 (×5): qty 200

## 2020-07-09 NOTE — Plan of Care (Signed)
?  Problem: Clinical Measurements: ?Goal: Will remain free from infection ?Outcome: Progressing ?  ?

## 2020-07-09 NOTE — Progress Notes (Signed)
PROGRESS NOTE    [name]  UVO:[id number] DOB: [birth year] DOA: 07/02/2020 PCP: [name], DO    Brief Narrative:  [name] is a 69 year old female with past medical history significant for type 2 diabetes mellitus, OSA, left ear s/p TAVR, obesity s/p gastric sleeve, CAD, essential hypertension, hyperlipidemia, stage IV CKD, chronic diastolic CHF, A. fib on Eliquis who was admitted on 07/02/2020 from a CHF clinic for acute on chronic diastolic congestive heart failure and questionable left lower extremity cellulitis.  Hospital service was consulted for possible left lower extremity cellulitis, with failed outpatient treatment with Keflex.   Assessment & Plan:   Principal Problem:   Acute on chronic diastolic heart failure (HCC) Active Problems:   Hyperlipidemia associated with type 2 diabetes mellitus (HCC)   Benign essential HTN   Coronary artery disease, non-occlusive   CKD stage 3 due to type 2 diabetes mellitus (HCC)   Cellulitis of left lower leg   Left lower extremity stasis dermatitis with ulcerations vs cellulitis Etiology likely suspicious for stasis dermatitis versus peripheral vascular disease.  Patient completed 5-day course of IV vancomycin.  Patient is afebrile without leukocytosis.  Left lower extremity vascular duplex arterial ultrasound 11/22 with popliteal artery appears occluded, distal PT and AT appear occluded unable to visualize bypass graft with possible collateral noted at distal femoral artery.  Patient has undergone a previous left lower extremity bypass graft 1 year ago that appears occluded.  Seen by vascular surgery, Dr. Scot Dock on 11/23.  This appears to be a chronic and not an acute occlusion.  Patient follows with vascular surgery at [facility].  Given that they are heavily involved in her care, vascular surgery does not see any acute indication for arteriography or further work-up at this time and recommend follow-up with her  primary vascular surgeon at [facility] following discharge. --continue Vascepa 2g PO BID  Acute on chronic diastolic congestive heart failure, decompensated BNP elevated 116.6 on admission.  TTE 07/07/2020 with LVEF 60 to 65%, no regional wall motion normalities, mild concentric LVH, moderate pericardial effusion without evidence of tamponade, mild mitral stenosis, IVC dilated in size.  TEE 07/08/2020 with LVEF 55%, mild LVH, LA mildly dilated, 1.4 cm calcified nodule posterior leaflet mitral valve, mild/moderate MR, no PFO, normal-appearing bioprosthetic aortic valve. 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  Starting amiodarone drip. --Eliquis 5 mg p.o. twice daily  Essential pretension BP 120/53 this morning, well controlled. --Hydralazine 50 mg TID  Hyperlipidemia --On fenofibrate 160 mg p.o. daily, nephrology recommends discontinuation given renal function --continue vascepa 2g PO BID  Type 2 diabetes mellitus Hemoglobin A1c 6.4. Home regimen includes NPH 55 units with breakfast and lunch, 20 units with dinner.  Patient with intermittent hypoglycemic episodes, stemming from prolonged n.p.o. status awaiting TEE yesterday in which she received her home regimen of insulin. --Continue to hold home NPH --Hypoglycemic protocol --Insulin sliding scale for further coverage --Continue close monitoring of CBGs every 4 hours --Update hemoglobin A1c in the a.m.  AKI on CKD stage IV Baseline creatinine 2.8-3.0.  Follows with Dr. Joelyn Oms, nephrology outpatient. --Nephrology  following, appreciate  assistance --Cr 3.54>3.49>3.03>3.12>3.37>3.72>3.21>2.75 --Receiving metolazone and furosemide 80 mg IV x1 today --Avoid nephrotoxins, renally dose all medications --Strict I's notes --Monitor renal function closely daily  Anemia of chronic kidney disease Iron 78, TIBC 468, ferritin 30, folate 11.8, B12 513.  FOBT positive.  Evaluated by [facility], Dr Henrene Pastor on 07/03/2020 with recommendations of increased Protonix to 40 mg twice daily, oral iron therapy and no indication for endoscopic procedures at this time. --s/p IV Feraheme 11/21 --s/p 1u PRBC on 11/23 --s/p aranesp 40 mcg on 11/24 --Hgb 8.2>9.9>9.9>9.5>7.8>9.4 --Continue Protonix 40 mg p.o. twice daily --Ferrous sulfate 325 mg p.o. twice daily --Outpatient follow-up with GI, Dr. Fuller Plan  Obesity Body mass index is 31.64 kg/m.  Discussed need for lifestyle changes/weight loss as this complicates all facets of care.  Weakness, deconditioning, debility: --PT/OT evaluation pending   DVT prophylaxis: Eliquis Code Status: Full code Family Communication: No family present at bedside this morning  Disposition Plan: Per primary, cardiology, heart failure service   Consultants:   Hospitalist  Nephrology  Vascular surgery  Procedures:   Right heart catheterization 11/22  Vascular ultrasound duplex lower extremity arterial 11/22  TTE 11/24  TEE 11/24:  Antimicrobials:   Vancomycin 11/18 - 11/23   Subjective: Patient seen and examined bedside, resting comfortably.  Once again with hypoglycemic episode this morning with glucose 54.  Also found to be in A. fib with RVR and cardiology starting amiodarone drip.  No other specific complaints or concerns at this time. Denies headache, no visual changes, no chest pain, no palpitations, no shortness of breath, no abdominal pain.  No other concerns other than hyperglycemia per nursing staff this morning.  Objective: Vitals:   07/08/20 1520 07/08/20 2003  07/09/20 0422 07/09/20 1015  BP: (!) 138/59 (!) 164/62 (!) 120/53 (!) 121/48  Pulse: 87 90 83 90  Resp: 18 18 17 18   Temp:  97.8 F (36.6 C) (!) 97.3 F (36.3 C) 98.6 F (37 C)  TempSrc:  Oral Oral Oral  SpO2: 93%   93%  Weight:      Height:        Intake/Output Summary (Last 24 hours) at 07/09/2020 1315 Last data filed at 07/09/2020 0423 Gross per 24 hour  Intake 250 ml  Output 2200 ml  Net -1950 ml   Filed Weights   07/07/20 0410 07/08/20 1000 07/08/20 1300  Weight: 92.7 kg 88.9 kg 88.9 kg    Examination:  General exam: Appears calm and comfortable  Respiratory system: Clear to auscultation. Respiratory effort normal. Cardiovascular system: S1 & S2 heard, RRR. No JVD, murmurs, rubs, gallops or clicks. No pedal edema. Gastrointestinal system: Abdomen is nondistended, soft and nontender. No organomegaly or masses felt. Normal bowel sounds heard. Central nervous system: Alert and oriented. No focal neurological deficits. Extremities: Symmetric 5 x 5 power. Skin: No rashes, lesions or ulcers Psychiatry: Judgement and insight appear normal. Mood & affect appropriate.     Data Reviewed: I have personally reviewed following labs and imaging studies  CBC: Recent Labs  Lab 07/02/20 1628 07/03/20 0248 07/05/20 0258 07/05/20 0258 07/06/20 0320 07/06/20 1003 07/06/20 1004 07/06/20 1007 07/07/20 0204 07/08/20 1540 07/09/20 0022  WBC 7.2   < > 10.5  --  8.7  --   --   --  8.9 9.4 8.6  NEUTROABS 6.1  --   --   --   --   --   --   --   --   --   --  HGB 7.9*   < > 7.9*   < > 8.2*   < > 9.9* 9.5* 7.8* 9.5* 9.4*  HCT 27.6*   < > 27.5*   < > 28.3*   < > 29.0* 28.0* 27.4* 32.5* 32.0*  MCV 73.4*   < > 73.7*  --  73.9*  --   --   --  73.9* 77.0* 77.7*  PLT 476*   < > 407*  --  374  --   --   --  342 389 354   < > = values in this interval not displayed.   Basic Metabolic Panel: Recent Labs  Lab 07/02/20 1628 07/03/20 0248 07/05/20 0258 07/05/20 0258 07/06/20 0320  07/06/20 1003 07/06/20 1004 07/06/20 1007 07/07/20 0204 07/08/20 0912 07/09/20 0022  NA 136   < > 137   < > 135   < > 138 139 135 139 137  K 3.9   < > 3.8   < > 4.1   < > 3.9 3.9 3.9 3.7 3.9  CL 97*   < > 100  --  98  --   --   --  97* 100 96*  CO2 27   < > 28  --  23  --   --   --  25 28 29   GLUCOSE 373*   < > 124*  --  188*  --   --   --  240* 97 82  BUN 58*   < > 51*  --  58*  --   --   --  61* 58* 52*  CREATININE 3.54*   < > 3.12*  --  3.37*  --   --   --  3.72* 3.21* 2.75*  CALCIUM 8.7*   < > 8.3*  --  8.4*  --   --   --  8.3* 8.6* 8.5*  MG 2.2  --   --   --   --   --   --   --   --   --  2.3   < > = values in this interval not displayed.   GFR: Estimated Creatinine Clearance: 21.7 mL/min (A) (by C-G formula based on SCr of 2.75 mg/dL (H)). Liver Function Tests: Recent Labs  Lab 07/02/20 1628  AST 49*  ALT 36  ALKPHOS 45  BILITOT 1.1  PROT 6.3*  ALBUMIN 2.7*   No results for input(s): LIPASE, AMYLASE in the last 168 hours. No results for input(s): AMMONIA in the last 168 hours. Coagulation Profile: No results for input(s): INR, PROTIME in the last 168 hours. Cardiac Enzymes: No results for input(s): CKTOTAL, CKMB, CKMBINDEX, TROPONINI in the last 168 hours. BNP (last 3 results) No results for input(s): PROBNP in the last 8760 hours. HbA1C: Recent Labs    07/09/20 0022  HGBA1C 6.4*   CBG: Recent Labs  Lab 07/08/20 2001 07/08/20 2334 07/09/20 0421 07/09/20 0553 07/09/20 0800  GLUCAP 160* 96 54* 155* 99   Lipid Profile: No results for input(s): CHOL, HDL, LDLCALC, TRIG, CHOLHDL, LDLDIRECT in the last 72 hours. Thyroid Function Tests: No results for input(s): TSH, T4TOTAL, FREET4, T3FREE, THYROIDAB in the last 72 hours. Anemia Panel: No results for input(s): VITAMINB12, FOLATE, FERRITIN, TIBC, IRON, RETICCTPCT in the last 72 hours. Sepsis Labs: No results for input(s): PROCALCITON, LATICACIDVEN in the last 168 hours.  Recent Results (from the past 240  hour(s))  Culture, blood (routine x 2)     Status: None   Collection  Time: 07/02/20  4:28 PM   Specimen: BLOOD  Result Value Ref Range Status   Specimen Description BLOOD RIGHT ANTECUBITAL  Final   Special Requests   Final    BOTTLES DRAWN AEROBIC AND ANAEROBIC Blood Culture adequate volume   Culture   Final    NO GROWTH 5 DAYS Performed at [facility], [address] [place], [place] [address]    Report Status 07/07/2020 FINAL  Final  Culture, blood (routine x 2)     Status: None   Collection Time: 07/02/20  4:31 PM   Specimen: BLOOD  Result Value Ref Range Status   Specimen Description BLOOD LEFT ANTECUBITAL  Final   Special Requests   Final    BOTTLES DRAWN AEROBIC AND ANAEROBIC Blood Culture adequate volume   Culture   Final    NO GROWTH 5 DAYS Performed at [facility], [place] [address] [place], [place] [address]    Report Status 07/07/2020 FINAL  Final  Respiratory Panel by RT PCR (Flu A&B, Covid) - Nasopharyngeal Swab     Status: None   Collection Time: 07/02/20  4:37 PM   Specimen: Nasopharyngeal Swab; Nasopharyngeal(NP) swabs in vial transport medium  Result Value Ref Range Status   SARS Coronavirus 2 by RT PCR NEGATIVE NEGATIVE Final    Comment: (NOTE) SARS-CoV-2 target nucleic acids are NOT DETECTED.  The SARS-CoV-2 RNA is generally detectable in upper respiratoy specimens during the acute phase of infection. The lowest concentration of SARS-CoV-2 viral copies this assay can detect is 131 copies/mL. A negative result does not preclude SARS-Cov-2 infection and should not be used as the sole basis for treatment or other patient management decisions. A negative result may occur with  improper specimen collection/handling, submission of specimen other than nasopharyngeal swab, presence of viral mutation(s) within the areas targeted by this assay, and inadequate number of viral copies (<131 copies/mL). A negative result must be combined with  clinical observations, patient history, and epidemiological information. The expected result is Negative.  Fact Sheet for Patients:  PinkCheek.be  Fact Sheet for Healthcare Providers:  GravelBags.it  This test is no t yet approved or cleared by the [place] FDA and  has been authorized for detection and/or diagnosis of SARS-CoV-2 by FDA under an Emergency Use Authorization (EUA). This EUA will remain  in effect (meaning this test can be used) for the duration of the COVID-19 declaration under Section 564(b)(1) of the Act, 21 U.S.C. section 360bbb-3(b)(1), unless the authorization is terminated or revoked sooner.     Influenza A by PCR NEGATIVE NEGATIVE Final   Influenza B by PCR NEGATIVE NEGATIVE Final    Comment: (NOTE) The Xpert Xpress SARS-CoV-2/FLU/RSV assay is intended as an aid in  the diagnosis of influenza from Nasopharyngeal swab specimens and  should not be used as a sole basis for treatment. Nasal washings and  aspirates are unacceptable for Xpert Xpress SARS-CoV-2/FLU/RSV  testing.  Fact Sheet for Patients: PinkCheek.be  Fact Sheet for Healthcare Providers: GravelBags.it  This test is not yet approved or cleared by the [place] FDA and  has been authorized for detection and/or diagnosis of SARS-CoV-2 by  FDA under an Emergency Use Authorization (EUA). This EUA will remain  in effect (meaning this test can be used) for the duration of the  Covid-19 declaration under Section 564(b)(1) of the Act, 21  U.S.C. section 360bbb-3(b)(1), unless the authorization is  terminated or revoked. Performed at [facility], [place]  [address], [place], [place] [address]          Radiology Studies: ECHOCARDIOGRAM COMPLETE  Result Date: 07/07/2020    ECHOCARDIOGRAM REPORT   Patient Name:   SHANA ZAVALETA Date of Exam: 07/07/2020 Medical Rec #:   [id number]        Height:       66.0 in Accession #:    [id number]       Weight:       204.4 lb Date of Birth:  1950/08/29        BSA:          2.018 m Patient Age:    71 years         BP:           132/55 mmHg Patient Gender: F                HR:           75 bpm. Exam Location:  Inpatient Procedure: 2D Echo Indications:    CHF-Acute I50.31  History:        Patient has prior history of Echocardiogram examinations, most                 recent 03/12/2020. Risk Factors:Dyslipidemia and Hypertension.  Sonographer:    Mikki Santee RDCS (AE) Referring Phys: [place]  1. Left ventricular ejection fraction, by estimation, is 60 to 65%. The left ventricle has normal function. The left ventricle has no regional wall motion abnormalities. There is mild concentric left ventricular hypertrophy. Diastolic function could not  be assesed due to absence of atrial contraction (junctional rhythm?).  2. Right ventricular systolic function is normal. The right ventricular size is normal. There is severely elevated pulmonary artery systolic pressure. The estimated right ventricular systolic pressure is 89.2 mmHg.  3. Left atrial size was mildly dilated.  4. Moderate pericardial effusion. The pericardial effusion is circumferential. There is no evidence of cardiac tamponade.  5. The mitral valve is degenerative. Mild mitral valve regurgitation. Mild mitral stenosis. The mean mitral valve gradient is 5.0 mmHg. Moderate mitral annular calcification.  6. Gradients across the TAVR prosthesis have increased and dimensionless index has worsened compared to 12/10/2019. The aortic valve has been repaired/replaced. Aortic valve regurgitation is not visualized. Aortic valve mean gradient measures 23.0 mmHg.  Aortic valve Vmax measures 3.03 m/s.  7. The inferior vena cava is dilated in size with <50% respiratory variability, suggesting right atrial pressure of 15 mmHg. Comparison(s): TAVR gradients have increased and this  is not related to increased cardiac output. Consider TEE if clinically indicated, for example if prosthetic leaflet thrombosis is suspected. FINDINGS  Left Ventricle: Left ventricular ejection fraction, by estimation, is 60 to 65%. The left ventricle has normal function. The left ventricle has no regional wall motion abnormalities. The left ventricular internal cavity size was normal in size. There is  mild concentric left ventricular hypertrophy. Left ventricular diastolic function could not be evaluated due to nondiagnostic images. Diastolic function could not be assesed due to absence of atrial contraction (junctional rhythm?). Right Ventricle: The right ventricular size is normal. No increase in right ventricular wall thickness. Right ventricular systolic function is normal. There is severely elevated pulmonary artery systolic pressure. The tricuspid regurgitant velocity is 3.70 m/s, and with an assumed right atrial pressure of 8 mmHg, the estimated right ventricular systolic pressure is 11.9 mmHg. Left Atrium: Left atrial size was mildly dilated. Right Atrium: Right atrial size was normal  in size. Pericardium: A moderately sized pericardial effusion is present. The pericardial effusion is circumferential. There is no evidence of cardiac tamponade. Mitral Valve: The mitral valve is degenerative in appearance. Moderate mitral annular calcification. Mild mitral valve regurgitation, with centrally-directed jet. Mild mitral valve stenosis. MV peak gradient, 19.5 mmHg. The mean mitral valve gradient is 5.0 mmHg with average heart rate of 73 bpm. Tricuspid Valve: The tricuspid valve is normal in structure. Tricuspid valve regurgitation is mild. Aortic Valve: Gradients across the TAVR prosthesis have increased and dimensionless index has worsened compared to 12/10/2019. The aortic valve has been repaired/replaced. Aortic valve regurgitation is not visualized. Aortic valve mean gradient measures 23.0 mmHg. Aortic valve  peak gradient measures 36.7 mmHg. Aortic valve area, by VTI measures 1.15 cm. There is a 23 mm Sapien prosthetic, stented (TAVR) valve present in the aortic position. Pulmonic Valve: The pulmonic valve was not well visualized. Pulmonic valve regurgitation is not visualized. Aorta: The aortic root is normal in size and structure. Venous: The inferior vena cava is dilated in size with less than 50% respiratory variability, suggesting right atrial pressure of 15 mmHg. IAS/Shunts: No atrial level shunt detected by color flow Doppler.  LEFT VENTRICLE PLAX 2D LVIDd:         4.50 cm  Diastology LVIDs:         2.90 cm  LV e' lateral:   6.05 cm/s LV PW:         1.30 cm  LV E/e' lateral: 33.6 LV IVS:        1.30 cm LVOT diam:     2.10 cm LV SV:         68 LV SV Index:   34 LVOT Area:     3.46 cm  LEFT ATRIUM             Index       RIGHT ATRIUM           Index LA diam:        4.30 cm 2.13 cm/m  RA Area:     13.80 cm LA Vol (A2C):   64.7 ml 32.06 ml/m RA Volume:   27.90 ml  13.82 ml/m LA Vol (A4C):   51.7 ml 25.62 ml/m LA Biplane Vol: 58.7 ml 29.08 ml/m  AORTIC VALVE AV Area (Vmax):    1.08 cm AV Area (Vmean):   1.02 cm AV Area (VTI):     1.15 cm AV Vmax:           303.00 cm/s AV Vmean:          233.000 cm/s AV VTI:            0.591 m AV Peak Grad:      36.7 mmHg AV Mean Grad:      23.0 mmHg LVOT Vmax:         94.50 cm/s LVOT Vmean:        68.700 cm/s LVOT VTI:          0.196 m LVOT/AV VTI ratio: 0.33  AORTA Ao Root diam: 2.70 cm MITRAL VALVE                TRICUSPID VALVE MV Area (PHT): 3.03 cm     TR Peak grad:   54.8 mmHg MV Peak grad:  19.5 mmHg    TR Vmax:        370.00 cm/s MV Mean grad:  5.0 mmHg MV Vmax:       2.21 m/s  SHUNTS MV Vmean:      94.0 cm/s    Systemic VTI:  0.20 m MV PHT:        73.97 msec   Systemic Diam: 2.10 cm MV Decel Time: 250 msec MV E velocity: 203.00 cm/s MV A velocity: 62.80 cm/s MV E/A ratio:  3.23 Mihai Croitoru MD Electronically signed by Sanda Klein MD Signature Date/Time:  07/07/2020/3:33:20 PM    Final    ECHO TEE  Result Date: 07/08/2020    TRANSESOPHOGEAL ECHO REPORT   Patient Name:   FENNA SEMEL Date of Exam: 07/08/2020 Medical Rec #:  [id number]        Height:       66.0 in Accession #:    [id number]       Weight:       196.0 lb Date of Birth:  03/10/1951        BSA:          1.983 m Patient Age:    23 years         BP:           110/60 mmHg Patient Gender: F                HR:           86 bpm. Exam Location:  Inpatient Procedure: Transesophageal Echo, Color Doppler and Cardiac Doppler Indications:    Aortic stenosis  History:        Patient has prior history of Echocardiogram examinations.  Sonographer:    NA Referring Phys: [id number] AMY D CLEGG PROCEDURE: The transesophogeal probe was passed without difficulty through the esophogus of the patient. Sedation performed by different physician. The patient developed no complications during the procedure. IMPRESSIONS  1. Left ventricular ejection fraction, by estimation, is 55%. The left ventricle has normal function. The left ventricle has no regional wall motion abnormalities. There is mild left ventricular hypertrophy.  2. Right ventricular systolic function is normal. The right ventricular size is normal.  3. Peak RV-RA gradient 63 mmHg.  4. Left atrial size was mildly dilated. No left atrial/left atrial appendage thrombus was detected.  5. No tamponade. Moderate pericardial effusion.  6. Calcified mitral valve, especially posterior leaflet. There is a 1.4 cm calcified nodule attached to the posterior leaflet (seen on prior studies). This may be a sequela of remote endocarditis. There is mild to moderate mitral regurgitation and mild mitral stenosis with mean gradient 5 mmHg but short pressure halftime.  7. There was a bioprosthetic aortic valve s/p TAVR. The valve visually opens normally. Mean gradient 29 mmHg across the aortic valve, no aortic insufficiency.  8. No PFO/ASD by color doppler.  9. Normal caliber thoracic  aorta with mild plaque. 10. The TAVR valve appears to open relatively normally, but has a significantly elevated mean gradient (29 mmHg). The valve is relatively small and also suspect volume overload with high flow. The valve does not appear thrombosed. FINDINGS  Left Ventricle: Left ventricular ejection fraction, by estimation, is 55%. The left ventricle has normal function. The left ventricle has no regional wall motion abnormalities. The left ventricular internal cavity size was normal in size. There is mild left ventricular hypertrophy. Right Ventricle: The right ventricular size is normal. No increase in right ventricular wall thickness. Right ventricular systolic function is normal. Left Atrium: Left atrial size was mildly dilated. No left atrial/left atrial appendage thrombus was detected. Right Atrium: Right atrial size was normal in size. Pericardium: No tamponade. A  moderately sized pericardial effusion is present. Mitral Valve: Calcified mitral valve, especially posterior leaflet. There is a 1.4 cm calcified nodule attached to the posterior leaflet (seen on prior studies). This may be sequelae of remote endocarditis. There is mild to moderate mitral regurgitation and mild mitral stenosis with mean gradient 5 mmHg but short pressure halftime. The mitral valve is abnormal. Mild to moderate mitral valve regurgitation. MV peak gradient, 15.4 mmHg. The mean mitral valve gradient is 5.0 mmHg. Tricuspid Valve: Peak RV-RA gradient 63 mmHg. The tricuspid valve is normal in structure. Tricuspid valve regurgitation is trivial. Aortic Valve: There was a bioprosthetic aortic valve s/p TAVR. The valve visually opens normally. Mean gradient 29 mmHg across the aortic valve, no aortic insufficiency. The aortic valve has been repaired/replaced. Aortic valve regurgitation is not visualized. Aortic valve mean gradient measures 27.5 mmHg. Aortic valve peak gradient measures 43.6 mmHg. Aortic valve area, by VTI measures 1.00  cm. Pulmonic Valve: The pulmonic valve was normal in structure. Pulmonic valve regurgitation is not visualized. Aorta: Normal caliber thoracic aorta with mild plaque. The aortic root is normal in size and structure. IAS/Shunts: No PFO/ASD by color doppler.  LEFT VENTRICLE PLAX 2D LVOT diam:     1.80 cm LV SV:         66 LV SV Index:   33 LVOT Area:     2.54 cm  AORTIC VALVE AV Area (Vmax):    0.96 cm AV Area (Vmean):   0.96 cm AV Area (VTI):     1.00 cm AV Vmax:           330.00 cm/s AV Vmean:          252.000 cm/s AV VTI:            0.665 m AV Peak Grad:      43.6 mmHg AV Mean Grad:      27.5 mmHg LVOT Vmax:         124.00 cm/s LVOT Vmean:        95.500 cm/s LVOT VTI:          0.260 m LVOT/AV VTI ratio: 0.39 MITRAL VALVE MV Area (PHT): 4.49 cm   SHUNTS MV Peak grad:  15.4 mmHg  Systemic VTI:  0.26 m MV Mean grad:  5.0 mmHg   Systemic Diam: 1.80 cm MV Vmax:       1.96 m/s MV Vmean:      107.0 cm/s [name] MD Electronically signed by [name] MD Signature Date/Time: 07/08/2020/3:12:31 PM    Final         Scheduled Meds: . sodium chloride   Intravenous Once  . apixaban  5 mg Oral BID  . docusate sodium  100 mg Oral BID  . fenofibrate  160 mg Oral Daily  . ferrous sulfate  325 mg Oral BID WC  . furosemide  80 mg Intravenous Once  . [START ON 07/10/2020] furosemide  80 mg Intravenous Once  . hydrALAZINE  50 mg Oral TID  . icosapent Ethyl  2 g Oral BID  . insulin aspart  0-9 Units Subcutaneous TID WC  . pantoprazole  40 mg Oral BID  . sodium chloride flush  3 mL Intravenous Q12H  . Tafamidis  61 mg Oral Daily   Continuous Infusions: . amiodarone 60 mg/hr (07/09/20 1118)   Followed by  . amiodarone       LOS: 7 days    Time spent: 39 minutes spent on chart review, discussion with nursing  staff, consultants, updating family and interview/physical exam; more than 50% of that time was spent in counseling and/or coordination of care.    Lizandro Spellman J [place] DO [facility] Available via Epic secure chat 7am-7pm After these hours, please refer to coverage provider listed on [email or web address] 07/09/2020, 1:15 PM

## 2020-07-09 NOTE — Progress Notes (Signed)
[facility] Progress Note  Name: [name] MRN: [id number] DOB: 1950/12/10  Chief Complaint:  Shortness of breath, leg swelling, fluid overload  Subjective:  She had 2.5 liters UOP over 11/24.  Had TEE 11/24 for aortic valve assessment (s/p TAVR) with elevated mean gradient (29 mmHg) and was also suggestive of overload.  She got lasix 80 mg IV once and metolazone on 11/24 - ended up actually getting in the evening due to procedure.  Went into rapid afib and cardiology is starting amio gtt.   Review of systems:  Reports shortness of breath - "wheezing cough".  Reports recent RSV infection as well  Denies chest pain  No n/v  ------ Background on consult:  [name] How is a 69 y.o. female with a history including CKD stage IV, chronic diastolic CHF, type 2 diabetes mellitus, hypertension, A. fib, nonobstructive CAD, obesity s/p gastric sleeve, and severe aortic stenosis status post TAVR, and OSA not compliant with CPAP who presented with shortness of breath and volume overload.  She has been diuresed and Cr has risen as per labs below.  She follows with Dr. Joelyn Oms at [facility] for her CKD with recent labs from [facility] as below.  At the time of her visit she was on torsemide 20 mg twice a day and was kept on this dose.  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  Note also a history of symptomatic bradycardia related to amiodarone and carvedilol which improved with dose reduction of the amiodarone and stopping her carvedilol; she then had tachyarrhythmia requiring DCCV and this resolved per charting. Recent baseline Cr 2.8 though in October 2021, Cr 3.5 and 3.2.  [facility] med list includes olmesartan-amlodipine-HCTZ which is not on [facility] med list.  She most recently had torsemide 40 mg daily from 11/20 and 11/21 then had lasix 80 mg IV x 2 doses on 11/22.  Lasix was held today.  She  has been on room air.  Had 800 mL UOP over 11/22.  She got a dose of feraheme earlier this hospitalization.  She has been on vanc for LLE cellulitis and note this was just stopped per cardiology.  She had a right heart cath with elevated filling pressures and IV lasix was resumed after that.  Now is not ordered.  Note diagnosis of cardiac amyloidosis.   She states that her breathing is still complicated by a wheezing, nagging cough.  States she has also had a hard time recovering from RSV infection.  She states that her leg swelling is much better than when she was admitted.  She states that she was recently started on Bactrim for cellulitis as an outpatient   Intake/Output Summary (Last 24 hours) at 07/09/2020 1105 Last data filed at 07/09/2020 0423 Gross per 24 hour  Intake 250 ml  Output 2200 ml  Net -1950 ml    Vitals:  Vitals:   07/08/20 1520 07/08/20 2003 07/09/20 0422 07/09/20 1015  BP: (!) 138/59 (!) 164/62 (!) 120/53 (!) 121/48  Pulse: 87 90 83 90  Resp: 18 18 17 18   Temp:  97.8 F (36.6 C) (!) 97.3 F (36.3 C) 98.6 F (37 C)  TempSrc:  Oral Oral Oral  SpO2: 93%   93%  Weight:      Height:         Physical Exam:   General: Adult female in bed obese habitus no acute distress at rest  HEENT: Normocephalic atraumatic Eyes: Extraocular movements intact sclera anicteric Neck: Increased neck circumference trachea midline Heart: S1-S2 no rub appreciated Lungs: Clear but reduced breath sounds on my exam.  She is on room air.  Unlabored at rest Abdomen: Softly distended/obese/nontender Extremities: Trace edema bilateral lower extremities  Skin: Left foot is wrapped no rash on extremities exposed Neuro: Alert and oriented x3 provides a history and follows commands Psych normal mood and affect  Medications reviewed   Labs:  BMP Latest Ref Rng & Units 07/09/2020 07/08/2020 07/07/2020  Glucose 70 - 99 mg/dL 82 97 240(H)  BUN 8 - 23 mg/dL 52(H) 58(H) 61(H)  Creatinine 0.44 -  1.00 mg/dL 2.75(H) 3.21(H) 3.72(H)  BUN/Creat Ratio 12 - 28 - - -  Sodium 135 - 145 mmol/L 137 139 135  Potassium 3.5 - 5.1 mmol/L 3.9 3.7 3.9  Chloride 98 - 111 mmol/L 96(L) 100 97(L)  CO2 22 - 32 mmol/L 29 28 25   Calcium 8.9 - 10.3 mg/dL 8.5(L) 8.6(L) 8.3(L)     Assessment/Plan:   # AKI - Pre-renal insults in setting of diuresis for acute CHF exacerbation however also with anemia and vanc use  - She is nearing the need for dialysis but has no acute need.  Cr is a bit better 11/24 - Addressing anemia as below to optimize renal recovery  - Lasix 80 mg IV once today and 11/26 AM.  To be reassessed per Renal 11/26 - Dr. Joylene Grapes is taking over the service - Would not resume the reported home med of tribenzor - olmesartan-amlodipine-HCTZ unclear if she has actually been taking  - Would defer fenofibrate given her advanced CKD   # CKD stage IV  - Secondary to DM, HTN and CHF  - Baseline Cr 2.8 - 3 - Follows with Dr[facility] Joelyn [facility]ms at [facility]   # Acute on chronic diastolic CHF - Diuretics as above  # Hypertension - acceptable control   # Anemia of CKD - received IV iron earlier this hospitalization.  S/p PRBC on 11/23.  S/p aranesp 40 mcg once on 11/24     # Left leg cellulitis - Status post vancomycin and previously on Bactrim as an outpatient - Would avoid Bactrim in advanced CKD and note that Vanc has been stopped  Claudia Desanctis, MD 07/09/2020 11:21 AM

## 2020-07-09 NOTE — Progress Notes (Signed)
Hypoglycemic Event  CBG: 54  Treatment: 4 oz juice/soda  Symptoms: Sweaty  Follow-up CBG: Time:0600 CBG Result: 154  Possible Reasons for Event: Unknown  Comments/MD notified:     Raylene Carmickle S

## 2020-07-09 NOTE — Progress Notes (Signed)
Progress Note  Patient Name: [name] Date of Encounter: 07/09/2020  [facility] Cardiologist: Minus Breeding, MD    Subjective   69 year old female with a history of chronic diastolic congestive heart failure.  She also has left lower extremity cellulitis.  She is being treated with IV vancomycin.  Right heart catheterization revealed elevated right filling pressures.  Lasix was resumed but her creatinine remains elevated. Creatinine was 3.72 on November 23.  The creatinine has come down to 2.75 this morning.  Transesophageal echo was performed yesterday to assess her TAVR.  She was found to have an RV RA gradient of 63 mmHg. The mean aortic valve gradient was elevated at 29 mmHg.  She has a history of paroxysmal atrial fibrillation.  She is on Eliquis and amiodarone.   She is in rapid Afib this am  Denies any chest pain or dyspnea   Inpatient Medications    Scheduled Meds: . sodium chloride   Intravenous Once  . amiodarone  200 mg Oral Daily  . apixaban  5 mg Oral BID  . docusate sodium  100 mg Oral BID  . fenofibrate  160 mg Oral Daily  . ferrous sulfate  325 mg Oral BID WC  . hydrALAZINE  50 mg Oral TID  . icosapent Ethyl  2 g Oral BID  . insulin aspart  0-9 Units Subcutaneous TID WC  . pantoprazole  40 mg Oral BID  . sodium chloride flush  3 mL Intravenous Q12H  . Tafamidis  61 mg Oral Daily   Continuous Infusions:  PRN Meds: acetaminophen, meclizine, ondansetron (ZOFRAN) IV   Vital Signs    Vitals:   07/08/20 1510 07/08/20 1520 07/08/20 2003 07/09/20 0422  BP: 140/62 (!) 138/59 (!) 164/62 (!) 120/53  Pulse: 90 87 90 83  Resp: 20 18 18 17   Temp:   97.8 F (36.6 C) (!) 97.3 F (36.3 C)  TempSrc:   Oral Oral  SpO2: 92% 93%    Weight:      Height:        Intake/Output Summary (Last 24 hours) at 07/09/2020 0849 Last data filed at 07/09/2020 0423 Gross per 24 hour  Intake 250 ml  Output 2200 ml  Net -1950 ml   Last 3 Weights 07/08/2020  07/08/2020 07/07/2020  Weight (lbs) 196 lb 196 lb 204 lb 5.9 oz  Weight (kg) 88.905 kg 88.905 kg 92.7 kg      Telemetry     rapid atrial fib - Personally Reviewed  ECG     - Personally Reviewed  Physical Exam   GEN:  obese, elderly female,  Sitting in bed  Neck: No JVD Cardiac:   Irreg. Irreg. , soft systolic murmur  Respiratory: Clear to auscultation bilaterally. GI: Soft, nontender, non-distended  MS: No edema; No deformity. Neuro:  Nonfocal  Psych: Normal affect   Labs    High Sensitivity Troponin:  No results for input(s): TROPONINIHS in the last 720 hours.    Chemistry Recent Labs  Lab 07/02/20 1628 07/03/20 0248 07/07/20 0204 07/08/20 0912 07/09/20 0022  NA 136   < > 135 139 137  K 3.9   < > 3.9 3.7 3.9  CL 97*   < > 97* 100 96*  CO2 27   < > 25 28 29   GLUCOSE 373*   < > 240* 97 82  BUN 58*   < > 61* 58* 52*  CREATININE 3.54*   < > 3.72* 3.21* 2.75*  CALCIUM 8.7*   < >  8.3* 8.6* 8.5*  PROT 6.3*  --   --   --   --   ALBUMIN 2.7*  --   --   --   --   AST 49*  --   --   --   --   ALT 36  --   --   --   --   ALKPHOS 45  --   --   --   --   BILITOT 1.1  --   --   --   --   GFRNONAA 13*   < > 13* 15* 18*  ANIONGAP 12   < > 13 11 12    < > = values in this interval not displayed.     Hematology Recent Labs  Lab 07/07/20 0204 07/08/20 1540 07/09/20 0022  WBC 8.9 9.4 8.6  RBC 3.71* 4.22 4.12  HGB 7.8* 9.5* 9.4*  HCT 27.4* 32.5* 32.0*  MCV 73.9* 77.0* 77.7*  MCH 21.0* 22.5* 22.8*  MCHC 28.5* 29.2* 29.4*  RDW 23.9* 25.0* 25.4*  PLT 342 389 354    BNP Recent Labs  Lab 07/02/20 1628  BNP 1,102.7*     DDimer No results for input(s): DDIMER in the last 168 hours.   Radiology    ECHOCARDIOGRAM COMPLETE  Result Date: 07/07/2020    ECHOCARDIOGRAM REPORT   Patient Name:   ZERIYAH WAIN Date of Exam: 07/07/2020 Medical Rec #:  [id number]        Height:       66.0 in Accession #:    [id number]       Weight:       204.4 lb Date of Birth:   1951/07/22        BSA:          2.018 m Patient Age:    14 years         BP:           132/55 mmHg Patient Gender: F                HR:           75 bpm. Exam Location:  Inpatient Procedure: 2D Echo Indications:    CHF-Acute I50.31  History:        Patient has prior history of Echocardiogram examinations, most                 recent 03/12/2020. Risk Factors:Dyslipidemia and Hypertension.  Sonographer:    Mikki Santee RDCS (AE) Referring Phys: [place]  1. Left ventricular ejection fraction, by estimation, is 60 to 65%. The left ventricle has normal function. The left ventricle has no regional wall motion abnormalities. There is mild concentric left ventricular hypertrophy. Diastolic function could not  be assesed due to absence of atrial contraction (junctional rhythm?).  2. Right ventricular systolic function is normal. The right ventricular size is normal. There is severely elevated pulmonary artery systolic pressure. The estimated right ventricular systolic pressure is 24.2 mmHg.  3. Left atrial size was mildly dilated.  4. Moderate pericardial effusion. The pericardial effusion is circumferential. There is no evidence of cardiac tamponade.  5. The mitral valve is degenerative. Mild mitral valve regurgitation. Mild mitral stenosis. The mean mitral valve gradient is 5.0 mmHg. Moderate mitral annular calcification.  6. Gradients across the TAVR prosthesis have increased and dimensionless index has worsened compared to 12/10/2019. The aortic valve has been repaired/replaced. Aortic valve regurgitation is not visualized. Aortic valve mean gradient  measures 23.0 mmHg.  Aortic valve Vmax measures 3.03 m/s.  7. The inferior vena cava is dilated in size with <50% respiratory variability, suggesting right atrial pressure of 15 mmHg. Comparison(s): TAVR gradients have increased and this is not related to increased cardiac output. Consider TEE if clinically indicated, for example if prosthetic  leaflet thrombosis is suspected. FINDINGS  Left Ventricle: Left ventricular ejection fraction, by estimation, is 60 to 65%. The left ventricle has normal function. The left ventricle has no regional wall motion abnormalities. The left ventricular internal cavity size was normal in size. There is  mild concentric left ventricular hypertrophy. Left ventricular diastolic function could not be evaluated due to nondiagnostic images. Diastolic function could not be assesed due to absence of atrial contraction (junctional rhythm?). Right Ventricle: The right ventricular size is normal. No increase in right ventricular wall thickness. Right ventricular systolic function is normal. There is severely elevated pulmonary artery systolic pressure. The tricuspid regurgitant velocity is 3.70 m/s, and with an assumed right atrial pressure of 8 mmHg, the estimated right ventricular systolic pressure is 53.9 mmHg. Left Atrium: Left atrial size was mildly dilated. Right Atrium: Right atrial size was normal in size. Pericardium: A moderately sized pericardial effusion is present. The pericardial effusion is circumferential. There is no evidence of cardiac tamponade. Mitral Valve: The mitral valve is degenerative in appearance. Moderate mitral annular calcification. Mild mitral valve regurgitation, with centrally-directed jet. Mild mitral valve stenosis. MV peak gradient, 19.5 mmHg. The mean mitral valve gradient is 5.0 mmHg with average heart rate of 73 bpm. Tricuspid Valve: The tricuspid valve is normal in structure. Tricuspid valve regurgitation is mild. Aortic Valve: Gradients across the TAVR prosthesis have increased and dimensionless index has worsened compared to 12/10/2019. The aortic valve has been repaired/replaced. Aortic valve regurgitation is not visualized. Aortic valve mean gradient measures 23.0 mmHg. Aortic valve peak gradient measures 36.7 mmHg. Aortic valve area, by VTI measures 1.15 cm. There is a 23 mm Sapien  prosthetic, stented (TAVR) valve present in the aortic position. Pulmonic Valve: The pulmonic valve was not well visualized. Pulmonic valve regurgitation is not visualized. Aorta: The aortic root is normal in size and structure. Venous: The inferior vena cava is dilated in size with less than 50% respiratory variability, suggesting right atrial pressure of 15 mmHg. IAS/Shunts: No atrial level shunt detected by color flow Doppler.  LEFT VENTRICLE PLAX 2D LVIDd:         4.50 cm  Diastology LVIDs:         2.90 cm  LV e' lateral:   6.05 cm/s LV PW:         1.30 cm  LV E/e' lateral: 33.6 LV IVS:        1.30 cm LVOT diam:     2.10 cm LV SV:         68 LV SV Index:   34 LVOT Area:     3.46 cm  LEFT ATRIUM             Index       RIGHT ATRIUM           Index LA diam:        4.30 cm 2.13 cm/m  RA Area:     13.80 cm LA Vol (A2C):   64.7 ml 32.06 ml/m RA Volume:   27.90 ml  13.82 ml/m LA Vol (A4C):   51.7 ml 25.62 ml/m LA Biplane Vol: 58.7 ml 29.08 ml/m  AORTIC VALVE AV Area (  Vmax):    1.08 cm AV Area (Vmean):   1.02 cm AV Area (VTI):     1.15 cm AV Vmax:           303.00 cm/s AV Vmean:          233.000 cm/s AV VTI:            0.591 m AV Peak Grad:      36.7 mmHg AV Mean Grad:      23.0 mmHg LVOT Vmax:         94.50 cm/s LVOT Vmean:        68.700 cm/s LVOT VTI:          0.196 m LVOT/AV VTI ratio: 0.33  AORTA Ao Root diam: 2.70 cm MITRAL VALVE                TRICUSPID VALVE MV Area (PHT): 3.03 cm     TR Peak grad:   54.8 mmHg MV Peak grad:  19.5 mmHg    TR Vmax:        370.00 cm/s MV Mean grad:  5.0 mmHg MV Vmax:       2.21 m/s     SHUNTS MV Vmean:      94.0 cm/s    Systemic VTI:  0.20 m MV PHT:        73.97 msec   Systemic Diam: 2.10 cm MV Decel Time: 250 msec MV E velocity: 203.00 cm/s MV A velocity: 62.80 cm/s MV E/A ratio:  3.23 Mihai Croitoru MD Electronically signed by Sanda Klein MD Signature Date/Time: 07/07/2020/3:33:20 PM    Final    ECHO TEE  Result Date: 07/08/2020    TRANSESOPHOGEAL ECHO REPORT    Patient Name:   RAYLEY GAO Date of Exam: 07/08/2020 Medical Rec #:  [id number]        Height:       66.0 in Accession #:    [id number]       Weight:       196.0 lb Date of Birth:  1950/11/01        BSA:          1.983 m Patient Age:    53 years         BP:           110/60 mmHg Patient Gender: F                HR:           86 bpm. Exam Location:  Inpatient Procedure: Transesophageal Echo, Color Doppler and Cardiac Doppler Indications:    Aortic stenosis  History:        Patient has prior history of Echocardiogram examinations.  Sonographer:    NA Referring Phys: [id number] AMY D CLEGG PROCEDURE: The transesophogeal probe was passed without difficulty through the esophogus of the patient. Sedation performed by different physician. The patient developed no complications during the procedure. IMPRESSIONS  1. Left ventricular ejection fraction, by estimation, is 55%. The left ventricle has normal function. The left ventricle has no regional wall motion abnormalities. There is mild left ventricular hypertrophy.  2. Right ventricular systolic function is normal. The right ventricular size is normal.  3. Peak RV-RA gradient 63 mmHg.  4. Left atrial size was mildly dilated. No left atrial/left atrial appendage thrombus was detected.  5. No tamponade. Moderate pericardial effusion.  6. Calcified mitral valve, especially posterior leaflet. There is a 1.4 cm calcified nodule attached to the posterior  leaflet (seen on prior studies). This may be a sequela of remote endocarditis. There is mild to moderate mitral regurgitation and mild mitral stenosis with mean gradient 5 mmHg but short pressure halftime.  7. There was a bioprosthetic aortic valve s/p TAVR. The valve visually opens normally. Mean gradient 29 mmHg across the aortic valve, no aortic insufficiency.  8. No PFO/ASD by color doppler.  9. Normal caliber thoracic aorta with mild plaque. 10. The TAVR valve appears to open relatively normally, but has a significantly  elevated mean gradient (29 mmHg). The valve is relatively small and also suspect volume overload with high flow. The valve does not appear thrombosed. FINDINGS  Left Ventricle: Left ventricular ejection fraction, by estimation, is 55%. The left ventricle has normal function. The left ventricle has no regional wall motion abnormalities. The left ventricular internal cavity size was normal in size. There is mild left ventricular hypertrophy. Right Ventricle: The right ventricular size is normal. No increase in right ventricular wall thickness. Right ventricular systolic function is normal. Left Atrium: Left atrial size was mildly dilated. No left atrial/left atrial appendage thrombus was detected. Right Atrium: Right atrial size was normal in size. Pericardium: No tamponade. A moderately sized pericardial effusion is present. Mitral Valve: Calcified mitral valve, especially posterior leaflet. There is a 1.4 cm calcified nodule attached to the posterior leaflet (seen on prior studies). This may be sequelae of remote endocarditis. There is mild to moderate mitral regurgitation and mild mitral stenosis with mean gradient 5 mmHg but short pressure halftime. The mitral valve is abnormal. Mild to moderate mitral valve regurgitation. MV peak gradient, 15.4 mmHg. The mean mitral valve gradient is 5.0 mmHg. Tricuspid Valve: Peak RV-RA gradient 63 mmHg. The tricuspid valve is normal in structure. Tricuspid valve regurgitation is trivial. Aortic Valve: There was a bioprosthetic aortic valve s/p TAVR. The valve visually opens normally. Mean gradient 29 mmHg across the aortic valve, no aortic insufficiency. The aortic valve has been repaired/replaced. Aortic valve regurgitation is not visualized. Aortic valve mean gradient measures 27.5 mmHg. Aortic valve peak gradient measures 43.6 mmHg. Aortic valve area, by VTI measures 1.00 cm. Pulmonic Valve: The pulmonic valve was normal in structure. Pulmonic valve regurgitation is not  visualized. Aorta: Normal caliber thoracic aorta with mild plaque. The aortic root is normal in size and structure. IAS/Shunts: No PFO/ASD by color doppler.  LEFT VENTRICLE PLAX 2D LVOT diam:     1.80 cm LV SV:         66 LV SV Index:   33 LVOT Area:     2.54 cm  AORTIC VALVE AV Area (Vmax):    0.96 cm AV Area (Vmean):   0.96 cm AV Area (VTI):     1.00 cm AV Vmax:           330.00 cm/s AV Vmean:          252.000 cm/s AV VTI:            0.665 m AV Peak Grad:      43.6 mmHg AV Mean Grad:      27.5 mmHg LVOT Vmax:         124.00 cm/s LVOT Vmean:        95.500 cm/s LVOT VTI:          0.260 m LVOT/AV VTI ratio: 0.39 MITRAL VALVE MV Area (PHT): 4.49 cm   SHUNTS MV Peak grad:  15.4 mmHg  Systemic VTI:  0.26 m MV Mean grad:  5.0 mmHg  Systemic Diam: 1.80 cm MV Vmax:       1.96 m/s MV Vmean:      107.0 cm/s [name] MD Electronically signed by [name] MD Signature Date/Time: 07/08/2020/3:12:31 PM    Final     Cardiac Studies     Patient Profile     69 y.o. female with obesity, PAF, AS/ TAVR   Assessment & Plan    1.  Acute on chronic diastolic congestive heart failure - cont diuresis,  Appreciate the assistance and guidance of nephrology   2.  Paroxysmal atrial fibrillation - she is in atrial fib currently.  RVR Will give IV amio bolus and drip  Hold PO amio for now   3.  Status post TAVR - mean AV gradient is elevated.     4.  Pulmonary hypertension She is diuresed a net 1.3 L so far during this hospitalization.  Weight this morning is 88.9 kg.   For questions or updates, please contact [place] Please consult [email or web address] for contact info under        Signed, Mertie Moores, MD  07/09/2020, 8:49 AM

## 2020-07-10 DIAGNOSIS — I5033 Acute on chronic diastolic (congestive) heart failure: Secondary | ICD-10-CM | POA: Diagnosis not present

## 2020-07-10 LAB — MAGNESIUM: Magnesium: 2.3 mg/dL (ref 1.7–2.4)

## 2020-07-10 LAB — BASIC METABOLIC PANEL
Anion gap: 15 (ref 5–15)
BUN: 58 mg/dL — ABNORMAL HIGH (ref 8–23)
CO2: 30 mmol/L (ref 22–32)
Calcium: 8.5 mg/dL — ABNORMAL LOW (ref 8.9–10.3)
Chloride: 92 mmol/L — ABNORMAL LOW (ref 98–111)
Creatinine, Ser: 2.94 mg/dL — ABNORMAL HIGH (ref 0.44–1.00)
GFR, Estimated: 17 mL/min — ABNORMAL LOW (ref >60)
Glucose, Bld: 238 mg/dL — ABNORMAL HIGH (ref 70–99)
Potassium: 3.8 mmol/L (ref 3.5–5.1)
Sodium: 137 mmol/L (ref 135–145)

## 2020-07-10 LAB — GLUCOSE, CAPILLARY
Glucose-Capillary: 186 mg/dL — ABNORMAL HIGH (ref 70–99)
Glucose-Capillary: 218 mg/dL — ABNORMAL HIGH (ref 70–99)
Glucose-Capillary: 233 mg/dL — ABNORMAL HIGH (ref 70–99)
Glucose-Capillary: 262 mg/dL — ABNORMAL HIGH (ref 70–99)
Glucose-Capillary: 269 mg/dL — ABNORMAL HIGH (ref 70–99)
Glucose-Capillary: 292 mg/dL — ABNORMAL HIGH (ref 70–99)

## 2020-07-10 LAB — CBC
HCT: 31.2 % — ABNORMAL LOW (ref 36.0–46.0)
Hemoglobin: 9.2 g/dL — ABNORMAL LOW (ref 12.0–15.0)
MCH: 23.1 pg — ABNORMAL LOW (ref 26.0–34.0)
MCHC: 29.5 g/dL — ABNORMAL LOW (ref 30.0–36.0)
MCV: 78.2 fL — ABNORMAL LOW (ref 80.0–100.0)
Platelets: 301 10*3/uL (ref 150–400)
RBC: 3.99 MIL/uL (ref 3.87–5.11)
RDW: 26.5 % — ABNORMAL HIGH (ref 11.5–15.5)
WBC: 6.4 10*3/uL (ref 4.0–10.5)
nRBC: 0.5 % — ABNORMAL HIGH (ref 0.0–0.2)

## 2020-07-10 MED ORDER — INSULIN NPH (HUMAN) (ISOPHANE) 100 UNIT/ML ~~LOC~~ SUSP
10.0000 [IU] | Freq: Two times a day (BID) | SUBCUTANEOUS | Status: DC
  Administered 2020-07-10 (×2): 10 [IU] via SUBCUTANEOUS

## 2020-07-10 MED ORDER — TORSEMIDE 20 MG PO TABS
40.0000 mg | ORAL_TABLET | Freq: Two times a day (BID) | ORAL | Status: DC
  Administered 2020-07-11: 40 mg via ORAL
  Filled 2020-07-10: qty 2

## 2020-07-10 NOTE — Progress Notes (Signed)
PROGRESS NOTE    [name]  UXN:[id number] DOB: [birth year] DOA: 07/02/2020 PCP: [name], DO    Brief Narrative:  [name] is a 69 year old female with past medical history significant for type 2 diabetes mellitus, OSA, left ear s/p TAVR, obesity s/p gastric sleeve, CAD, essential hypertension, hyperlipidemia, stage IV CKD, chronic diastolic CHF, A. fib on Eliquis who was admitted on 07/02/2020 from a CHF clinic for acute on chronic diastolic congestive heart failure and questionable left lower extremity cellulitis.  Hospital service was consulted for possible left lower extremity cellulitis, with failed outpatient treatment with Keflex.   Assessment & Plan:   Principal Problem:   Acute on chronic diastolic heart failure (HCC) Active Problems:   Hyperlipidemia associated with type 2 diabetes mellitus (HCC)   Benign essential HTN   Coronary artery disease, non-occlusive   CKD stage 3 due to type 2 diabetes mellitus (HCC)   Cellulitis of left lower leg   Left lower extremity stasis dermatitis with ulcerations vs cellulitis Etiology likely suspicious for stasis dermatitis versus peripheral vascular disease.  Patient completed 5-day course of IV vancomycin.  Patient is afebrile without leukocytosis.  Left lower extremity vascular duplex arterial ultrasound 11/22 with popliteal artery appears occluded, distal PT and AT appear occluded unable to visualize bypass graft with possible collateral noted at distal femoral artery.  Patient has undergone a previous left lower extremity bypass graft 1 year ago that appears occluded.  Seen by vascular surgery, Dr. Scot Dock on 11/23.  This appears to be a chronic and not an acute occlusion.  Patient follows with vascular surgery at [facility].  Given that they are heavily involved in her care, vascular surgery does not see any acute indication for arteriography or further work-up at this time and recommend follow-up with her  primary vascular surgeon at [facility] following discharge. --continue Vascepa 2g PO BID  Acute on chronic diastolic congestive heart failure, decompensated BNP elevated 116.6 on admission.  TTE 07/07/2020 with LVEF 60 to 65%, no regional wall motion normalities, mild concentric LVH, moderate pericardial effusion without evidence of tamponade, mild mitral stenosis, IVC dilated in size.  TEE 07/08/2020 with LVEF 55%, mild LVH, LA mildly dilated, 1.4 cm calcified nodule posterior leaflet mitral valve, mild/moderate MR, no PFO, normal-appearing bioprosthetic aortic valve. 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   --continues on amiodarone drip; cardiology plans to transition back to oral tomorrow --Eliquis 5 mg p.o. twice daily  Essential pretension BP 120/53 this morning, well controlled. --Hydralazine 50 mg TID  Hyperlipidemia --On fenofibrate 160 mg p.o. daily, nephrology recommends discontinuation given renal function --continue vascepa 2g PO BID  Type 2 diabetes mellitus Hemoglobin A1c 6.4. Home regimen includes NPH 55 units with breakfast and lunch, 20 units with dinner.  Patient with intermittent hypoglycemic episodes, stemming from prolonged n.p.o. status awaiting TEE yesterday in which she received her home regimen of insulin. --restart NPH at 10u Kaplan BID --Insulin sliding scale for further coverage --Continue close monitoring of CBGs every 4 hours  AKI on CKD stage IV Baseline creatinine 2.8-3.0.  Follows with Dr.  Joelyn Oms, nephrology outpatient. --Nephrology following, appreciate assistance --Cr 3.54>3.49>3.03>3.12>3.37>3.72>3.21>2.75>2.94 --Receiving furosemide 80 mg IV x1 today; with plan torsemide 60mg  PO BID on 11/27 --Avoid nephrotoxins, renally dose all medications --Strict I's notes --Monitor renal function closely daily  Anemia of chronic kidney disease Iron 78, TIBC 468, ferritin 30, folate 11.8, B12 513.  FOBT positive.  Evaluated by [facility], Dr Henrene Pastor on 07/03/2020 with recommendations of increased Protonix to 40 mg twice daily, oral iron therapy and no indication for endoscopic procedures at this time. --s/p IV Feraheme 11/21 --s/p 1u PRBC on 11/23 --s/p aranesp 40 mcg on 11/24 --Hgb 8.2>9.9>9.9>9.5>7.8>9.4 --Continue Protonix 40 mg p.o. twice daily --Ferrous sulfate 325 mg p.o. twice daily --Outpatient follow-up with GI, Dr. Fuller Plan  Obesity Body mass index is 30.67 kg/m.  Discussed need for lifestyle changes/weight loss as this complicates all facets of care.  Weakness, deconditioning, debility: --PT recommends home health on dc   DVT prophylaxis: Eliquis Code Status: Full code Family Communication: No family present at bedside this morning  Disposition Plan: Per primary, cardiology, heart failure service   Consultants:   Hospitalist  Nephrology  Vascular surgery  Procedures:   Right heart catheterization 11/22  Vascular ultrasound duplex lower extremity arterial 11/22  TTE 11/24  TEE 11/24:  Antimicrobials:   Vancomycin 11/18 - 11/23   Subjective: Patient seen and examined bedside, resting comfortably.  Sitting up at bedside.  Heart rate now well controlled on amiodarone drip.  Glucose now trending up, discussed will restart NPH at lower dose and continue to monitor closely.  Receiving further IV diuresis per heart failure service nephrology this morning.  No other questions or concerns at this time. Denies headache, no visual changes, no chest pain, no  palpitations, no shortness of breath, no abdominal pain.  No other concerns other than hyperglycemia per nursing staff this morning.  Objective: Vitals:   07/09/20 2041 07/10/20 0039 07/10/20 0040 07/10/20 0458  BP: (!) 144/54  (!) 130/59 122/83  Pulse: 76 82  78  Resp: 18 19  17   Temp: 98.4 F (36.9 C) 98 F (36.7 C)  97.8 F (36.6 C)  TempSrc: Oral Oral  Oral  SpO2: 95% 97%  95%  Weight:    86.2 kg  Height:        Intake/Output Summary (Last 24 hours) at 07/10/2020 1258 Last data filed at 07/10/2020 0721 Gross per 24 hour  Intake 471.47 ml  Output 1100 ml  Net -628.53 ml   Filed Weights   07/08/20 1000 07/08/20 1300 07/10/20 0458  Weight: 88.9 kg 88.9 kg 86.2 kg    Examination:  General exam: Appears calm and comfortable  Respiratory system: Clear to auscultation. Respiratory effort normal. Cardiovascular system: S1 & S2 heard, RRR. No JVD, murmurs, rubs, gallops or clicks. No pedal edema. Gastrointestinal system: Abdomen is nondistended, soft and nontender. No organomegaly or masses felt. Normal bowel sounds heard. Central nervous system: Alert and oriented. No focal neurological deficits. Extremities: Symmetric 5 x 5 power, noted left TMA Skin: No rashes, lesions or ulcers Psychiatry: Judgement and insight appear normal. Mood & affect appropriate.     Data Reviewed: I have personally reviewed following labs and imaging studies  CBC: Recent Labs  Lab 07/06/20 0320 07/06/20 1003 07/06/20 1007 07/07/20 0204 07/08/20 1540 07/09/20 0022 07/10/20 0223  WBC 8.7  --   --  8.9 9.4 8.6 6.4  HGB 8.2*   < > 9.5* 7.8* 9.5* 9.4* 9.2*  HCT 28.3*   < >  28.0* 27.4* 32.5* 32.0* 31.2*  MCV 73.9*  --   --  73.9* 77.0* 77.7* 78.2*  PLT 374  --   --  342 389 354 301   < > = values in this interval not displayed.   Basic Metabolic Panel: Recent Labs  Lab 07/06/20 0320 07/06/20 1003 07/06/20 1007 07/07/20 0204 07/08/20 0912 07/09/20 0022 07/10/20 0223  NA 135   < >  139 135 139 137 137  K 4.1   < > 3.9 3.9 3.7 3.9 3.8  CL 98  --   --  97* 100 96* 92*  CO2 23  --   --  25 28 29 30   GLUCOSE 188*  --   --  240* 97 82 238*  BUN 58*  --   --  61* 58* 52* 58*  CREATININE 3.37*  --   --  3.72* 3.21* 2.75* 2.94*  CALCIUM 8.4*  --   --  8.3* 8.6* 8.5* 8.5*  MG  --   --   --   --   --  2.3 2.3   < > = values in this interval not displayed.   GFR: Estimated Creatinine Clearance: 20 mL/min (A) (by C-G formula based on SCr of 2.94 mg/dL (H)). Liver Function Tests: No results for input(s): AST, ALT, ALKPHOS, BILITOT, PROT, ALBUMIN in the last 168 hours. No results for input(s): LIPASE, AMYLASE in the last 168 hours. No results for input(s): AMMONIA in the last 168 hours. Coagulation Profile: No results for input(s): INR, PROTIME in the last 168 hours. Cardiac Enzymes: No results for input(s): CKTOTAL, CKMB, CKMBINDEX, TROPONINI in the last 168 hours. BNP (last 3 results) No results for input(s): PROBNP in the last 8760 hours. HbA1C: Recent Labs    07/09/20 0022  HGBA1C 6.4*   CBG: Recent Labs  Lab 07/09/20 2039 07/10/20 0039 07/10/20 0453 07/10/20 0909 07/10/20 1134  GLUCAP 375* 269* 186* 292* 233*   Lipid Profile: No results for input(s): CHOL, HDL, LDLCALC, TRIG, CHOLHDL, LDLDIRECT in the last 72 hours. Thyroid Function Tests: No results for input(s): TSH, T4TOTAL, FREET4, T3FREE, THYROIDAB in the last 72 hours. Anemia Panel: No results for input(s): VITAMINB12, FOLATE, FERRITIN, TIBC, IRON, RETICCTPCT in the last 72 hours. Sepsis Labs: No results for input(s): PROCALCITON, LATICACIDVEN in the last 168 hours.  Recent Results (from the past 240 hour(s))  Culture, blood (routine x 2)     Status: None   Collection Time: 07/02/20  4:28 PM   Specimen: BLOOD  Result Value Ref Range Status   Specimen Description BLOOD RIGHT ANTECUBITAL  Final   Special Requests   Final    BOTTLES DRAWN AEROBIC AND ANAEROBIC Blood Culture adequate volume    Culture   Final    NO GROWTH 5 DAYS Performed at [facility], [address], [place], [place] [address]    Report Status 07/07/2020 FINAL  Final  Culture, blood (routine x 2)     Status: None   Collection Time: 07/02/20  4:31 PM   Specimen: BLOOD  Result Value Ref Range Status   Specimen Description BLOOD LEFT ANTECUBITAL  Final   Special Requests   Final    BOTTLES DRAWN AEROBIC AND ANAEROBIC Blood Culture adequate volume   Culture   Final    NO GROWTH 5 DAYS Performed at [facility], [place] [address], [place], [place] [address]    Report Status 07/07/2020 FINAL  Final  Respiratory Panel by RT PCR (  Flu A&B, Covid) - Nasopharyngeal Swab     Status: None   Collection Time: 07/02/20  4:37 PM   Specimen: Nasopharyngeal Swab; Nasopharyngeal(NP) swabs in vial transport medium  Result Value Ref Range Status   SARS Coronavirus 2 by RT PCR NEGATIVE NEGATIVE Final    Comment: (NOTE) SARS-CoV-2 target nucleic acids are NOT DETECTED.  The SARS-CoV-2 RNA is generally detectable in upper respiratoy specimens during the acute phase of infection. The lowest concentration of SARS-CoV-2 viral copies this assay can detect is 131 copies/mL. A negative result does not preclude SARS-Cov-2 infection and should not be used as the sole basis for treatment or other patient management decisions. A negative result may occur with  improper specimen collection/handling, submission of specimen other than nasopharyngeal swab, presence of viral mutation(s) within the areas targeted by this assay, and inadequate number of viral copies (<131 copies/mL). A negative result must be combined with clinical observations, patient history, and epidemiological information. The expected result is Negative.  Fact Sheet for Patients:  PinkCheek.be  Fact Sheet for Healthcare Providers:  GravelBags.it  This test is no t yet approved or cleared by the  [place] FDA and  has been authorized for detection and/or diagnosis of SARS-CoV-2 by FDA under an Emergency Use Authorization (EUA). This EUA will remain  in effect (meaning this test can be used) for the duration of the COVID-19 declaration under Section 564(b)(1) of the Act, 21 U.S.C. section 360bbb-3(b)(1), unless the authorization is terminated or revoked sooner.     Influenza A by PCR NEGATIVE NEGATIVE Final   Influenza B by PCR NEGATIVE NEGATIVE Final    Comment: (NOTE) The Xpert Xpress SARS-CoV-2/FLU/RSV assay is intended as an aid in  the diagnosis of influenza from Nasopharyngeal swab specimens and  should not be used as a sole basis for treatment. Nasal washings and  aspirates are unacceptable for Xpert Xpress SARS-CoV-2/FLU/RSV  testing.  Fact Sheet for Patients: PinkCheek.be  Fact Sheet for Healthcare Providers: GravelBags.it  This test is not yet approved or cleared by the [place] FDA and  has been authorized for detection and/or diagnosis of SARS-CoV-2 by  FDA under an Emergency Use Authorization (EUA). This EUA will remain  in effect (meaning this test can be used) for the duration of the  Covid-19 declaration under Section 564(b)(1) of the Act, 21  U.S.C. section 360bbb-3(b)(1), unless the authorization is  terminated or revoked. Performed at [facility], [place] [address] [place], [place] [address]          Radiology Studies: ECHO TEE  Result Date: 07/08/2020    TRANSESOPHOGEAL ECHO REPORT   Patient Name:   JEROME OTTER Date of Exam: 07/08/2020 Medical Rec #:  [id number]        Height:       66.0 in Accession #:    [id number]       Weight:       196.0 lb Date of Birth:  05/03/1951        BSA:          1.983 m Patient Age:    89 years         BP:           110/60 mmHg Patient Gender: F                HR:           86 bpm. Exam Location:  Inpatient Procedure: Transesophageal Echo,  Color Doppler and  Cardiac Doppler Indications:    Aortic stenosis  History:        Patient has prior history of Echocardiogram examinations.  Sonographer:    NA Referring Phys: [id number] AMY D CLEGG PROCEDURE: The transesophogeal probe was passed without difficulty through the esophogus of the patient. Sedation performed by different physician. The patient developed no complications during the procedure. IMPRESSIONS  1. Left ventricular ejection fraction, by estimation, is 55%. The left ventricle has normal function. The left ventricle has no regional wall motion abnormalities. There is mild left ventricular hypertrophy.  2. Right ventricular systolic function is normal. The right ventricular size is normal.  3. Peak RV-RA gradient 63 mmHg.  4. Left atrial size was mildly dilated. No left atrial/left atrial appendage thrombus was detected.  5. No tamponade. Moderate pericardial effusion.  6. Calcified mitral valve, especially posterior leaflet. There is a 1.4 cm calcified nodule attached to the posterior leaflet (seen on prior studies). This may be a sequela of remote endocarditis. There is mild to moderate mitral regurgitation and mild mitral stenosis with mean gradient 5 mmHg but short pressure halftime.  7. There was a bioprosthetic aortic valve s/p TAVR. The valve visually opens normally. Mean gradient 29 mmHg across the aortic valve, no aortic insufficiency.  8. No PFO/ASD by color doppler.  9. Normal caliber thoracic aorta with mild plaque. 10. The TAVR valve appears to open relatively normally, but has a significantly elevated mean gradient (29 mmHg). The valve is relatively small and also suspect volume overload with high flow. The valve does not appear thrombosed. FINDINGS  Left Ventricle: Left ventricular ejection fraction, by estimation, is 55%. The left ventricle has normal function. The left ventricle has no regional wall motion abnormalities. The left ventricular internal cavity size was normal in size.  There is mild left ventricular hypertrophy. Right Ventricle: The right ventricular size is normal. No increase in right ventricular wall thickness. Right ventricular systolic function is normal. Left Atrium: Left atrial size was mildly dilated. No left atrial/left atrial appendage thrombus was detected. Right Atrium: Right atrial size was normal in size. Pericardium: No tamponade. A moderately sized pericardial effusion is present. Mitral Valve: Calcified mitral valve, especially posterior leaflet. There is a 1.4 cm calcified nodule attached to the posterior leaflet (seen on prior studies). This may be sequelae of remote endocarditis. There is mild to moderate mitral regurgitation and mild mitral stenosis with mean gradient 5 mmHg but short pressure halftime. The mitral valve is abnormal. Mild to moderate mitral valve regurgitation. MV peak gradient, 15.4 mmHg. The mean mitral valve gradient is 5.0 mmHg. Tricuspid Valve: Peak RV-RA gradient 63 mmHg. The tricuspid valve is normal in structure. Tricuspid valve regurgitation is trivial. Aortic Valve: There was a bioprosthetic aortic valve s/p TAVR. The valve visually opens normally. Mean gradient 29 mmHg across the aortic valve, no aortic insufficiency. The aortic valve has been repaired/replaced. Aortic valve regurgitation is not visualized. Aortic valve mean gradient measures 27.5 mmHg. Aortic valve peak gradient measures 43.6 mmHg. Aortic valve area, by VTI measures 1.00 cm. Pulmonic Valve: The pulmonic valve was normal in structure. Pulmonic valve regurgitation is not visualized. Aorta: Normal caliber thoracic aorta with mild plaque. The aortic root is normal in size and structure. IAS/Shunts: No PFO/ASD by color doppler.  LEFT VENTRICLE PLAX 2D LVOT diam:     1.80 cm LV SV:         66 LV SV Index:   33 LVOT Area:     2.54 cm  AORTIC VALVE AV Area (Vmax):    0.96 cm AV Area (Vmean):   0.96 cm AV Area (VTI):     1.00 cm AV Vmax:           330.00 cm/s AV Vmean:           252.000 cm/s AV VTI:            0.665 m AV Peak Grad:      43.6 mmHg AV Mean Grad:      27.5 mmHg LVOT Vmax:         124.00 cm/s LVOT Vmean:        95.500 cm/s LVOT VTI:          0.260 m LVOT/AV VTI ratio: 0.39 MITRAL VALVE MV Area (PHT): 4.49 cm   SHUNTS MV Peak grad:  15.4 mmHg  Systemic VTI:  0.26 m MV Mean grad:  5.0 mmHg   Systemic Diam: 1.80 cm MV Vmax:       1.96 m/s MV Vmean:      107.0 cm/s [name] MD Electronically signed by [name] MD Signature Date/Time: 07/08/2020/3:12:31 PM    Final         Scheduled Meds: . sodium chloride   Intravenous Once  . apixaban  5 mg Oral BID  . docusate sodium  100 mg Oral BID  . fenofibrate  160 mg Oral Daily  . ferrous sulfate  325 mg Oral BID WC  . hydrALAZINE  50 mg Oral TID  . icosapent Ethyl  2 g Oral BID  . insulin aspart  0-9 Units Subcutaneous TID WC  . insulin NPH Human  10 Units Subcutaneous BID AC & HS  . pantoprazole  40 mg Oral BID  . sodium chloride flush  3 mL Intravenous Q12H  . Tafamidis  61 mg Oral Daily  . [START ON 07/11/2020] torsemide  40 mg Oral BID   Continuous Infusions: . amiodarone 30 mg/hr (07/10/20 1051)     LOS: 8 days    Time spent: 39 minutes spent on chart review, discussion with nursing staff, consultants, updating family and interview/physical exam; more than 50% of that time was spent in counseling and/or coordination of care.    Herschel Fleagle J [place] DO [facility] Available via Epic secure chat 7am-7pm After these hours, please refer to coverage provider listed on [email or web address] 07/10/2020, 12:58 PM

## 2020-07-10 NOTE — Progress Notes (Signed)
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  Pt is uncontrolled with HTN, has elevation of creatinine, LLE cellulitis with pain.  PMHx:  anemia, HLD, mitral valve disease and enterococcal endocarditis, PAD, CHF,     PT Comments    Pt making steady progress with mobility and beginning to amb in room. Pt to go home with supportive husband.    Follow Up Recommendations  Home health PT;Supervision for mobility/OOB     Equipment Recommendations  None recommended by PT    Recommendations for Other Services       Precautions / Restrictions Precautions Precautions: Fall Precaution Comments: fatigues quickly.    Mobility  Bed Mobility Overal bed mobility: Needs Assistance Bed Mobility: Supine to Sit     Supine to sit: Min guard;HOB elevated     General bed mobility comments: Use of railing and incr time. No physical assist.   Transfers Overall transfer level: Needs assistance Equipment used: Rolling walker (2 wheeled) Transfers: Sit to/from Stand Sit to Stand: Min assist;Min guard         General transfer comment: Assist from bed to bring hips up. From chair min guard with pt using momentum to get up  Ambulation/Gait Ambulation/Gait assistance: Min assist;+2 safety/equipment Gait Distance (Feet): 10 Feet (5' x 1, 10' x 1) Assistive device: Rolling walker (2 wheeled) Gait Pattern/deviations: Step-through pattern;Decreased step length - right;Decreased step length - left;Shuffle;Trunk flexed Gait velocity: decr Gait velocity interpretation: <1.31 ft/sec, indicative of household ambulator General Gait Details: Assist for balance and support.    Stairs             Wheelchair Mobility    Modified Rankin (Stroke Patients Only)        Balance Overall balance assessment: Needs assistance Sitting-balance support: Feet supported Sitting balance-Leahy Scale: Good     Standing balance support: Bilateral upper extremity supported Standing balance-Leahy Scale: Poor Standing balance comment: walker and min guard for static standing                            Cognition Arousal/Alertness: Awake/alert Behavior During Therapy: WFL for tasks assessed/performed Overall Cognitive Status: Within Functional Limits for tasks assessed                                        Exercises      General Comments General comments (skin integrity, edema, etc.): VSS      Pertinent Vitals/Pain Pain Assessment: Faces Faces Pain Scale: Hurts even more Pain Location: back, LLE Pain Descriptors / Indicators: Guarding;Grimacing Pain Intervention(s): Monitored during session;Repositioned    Home Living                      Prior Function            PT Goals (current goals can now be found in the care plan section) Acute Rehab PT Goals Patient Stated Goal: return home PT Goal Formulation: With patient/family Time For Goal Achievement: 07/17/20 Potential to Achieve Goals: Good Progress towards PT goals: Goals met and updated - see care plan    Frequency    Min 3X/week  PT Plan Current plan remains appropriate    Co-evaluation              AM-PAC PT "6 Clicks" Mobility   Outcome Measure  Help needed turning from your back to your side while in a flat bed without using bedrails?: A Little Help needed moving from lying on your back to sitting on the side of a flat bed without using bedrails?: A Little Help needed moving to and from a bed to a chair (including a wheelchair)?: A Little Help needed standing up from a chair using your arms (e.g., wheelchair or bedside chair)?: A Little Help needed to walk in hospital room?: A Little Help needed climbing 3-5 steps with a railing? :  Total 6 Click Score: 16    End of Session Equipment Utilized During Treatment: Gait belt Activity Tolerance: Patient tolerated treatment well Patient left: with call bell/phone within reach;in chair;with chair alarm set;with family/visitor present (encouraged pt to sit in modified chair position to improve breathing) Nurse Communication: Mobility status PT Visit Diagnosis: Unsteadiness on feet (R26.81);Muscle weakness (generalized) (M62.81);Difficulty in walking, not elsewhere classified (R26.2);Pain Pain - Right/Left: Left Pain - part of body: Leg     Time: 0228-4069 PT Time Calculation (min) (ACUTE ONLY): 13 min  Charges:  $Gait Training: 8-22 mins                     [place] Pager [phone] Office [place] 07/10/2020, 9:12 AM

## 2020-07-10 NOTE — Progress Notes (Signed)
[facility] Progress Note  Name: [name] MRN: [id number] DOB: 11/27/1950  Chief Complaint:  Shortness of breath, leg swelling, fluid overload  Subjective:  1.1 L of urine output over the past 24 hours with IV Lasix.  Has had atrial fibrillation requiring amiodarone.  Continues to have shortness of breath is largely unchanged.  Review of systems:  12 systems reviewed and negative except per subjective  ------    Intake/Output Summary (Last 24 hours) at 07/10/2020 1026 Last data filed at 07/10/2020 0721 Gross per 24 hour  Intake 471.47 ml  Output 1100 ml  Net -628.53 ml    Vitals:  Vitals:   07/09/20 2041 07/10/20 0039 07/10/20 0040 07/10/20 0458  BP: (!) 144/54  (!) 130/59 122/83  Pulse: 76 82  78  Resp: 18 19  17   Temp: 98.4 F (36.9 C) 98 F (36.7 C)  97.8 F (36.6 C)  TempSrc: Oral Oral  Oral  SpO2: 95% 97%  95%  Weight:    86.2 kg  Height:         Physical Exam:   General: Adult female in bed obese habitus no acute distress at rest HEENT: Normocephalic atraumatic Eyes: Extraocular movements intact sclera anicteric Neck: Increased neck circumference Heart: S1-S2 no rub appreciated Lungs:  Clear to auscultation bilaterally, bilateral chest rise, no increased work of breathing Abdomen: Softly distended/obese/nontender Extremities: Trace edema bilateral lower extremities  Neuro: Alert and oriented x3 provides a history and follows commands Psych normal mood and affect  Medications reviewed   Labs:  BMP Latest Ref Rng & Units 07/10/2020 07/09/2020 07/08/2020  Glucose 70 - 99 mg/dL 238(H) 82 97  BUN 8 - 23 mg/dL 58(H) 52(H) 58(H)  Creatinine 0.44 - 1.00 mg/dL 2.94(H) 2.75(H) 3.21(H)  BUN/Creat Ratio 12 - 28 - - -  Sodium 135 - 145 mmol/L 137 137 139  Potassium 3.5 - 5.1 mmol/L 3.8 3.9 3.7  Chloride 98 - 111 mmol/L 92(L) 96(L) 100  CO2 22 - 32 mmol/L 30 29 28   Calcium 8.9 - 10.3 mg/dL 8.5(L) 8.5(L) 8.6(L)     Assessment/Plan:    # AKI on CKD 4: Baseline creatinine 2.8-3.2 multifactorial.  AKI likely related to cardiorenal syndrome.  Creatinine is remained stable within her baseline.  Can continue IV Lasix today with plans for oral diuretics tomorrow. - Crt remains within baseline today - Switch to torsemide 40mg  BID - Addressing anemia as below to optimize renal recovery  - Would not resume the reported home med of tribenzor - olmesartan-amlodipine-HCTZ unclear if she has actually been taking  - Would defer fenofibrate given her advanced CKD   # CKD stage IV  - Secondary to DM, HTN and CHF  - Baseline Cr 2.8 - 3.2 - Follows with Dr. Joelyn [name] at [facility]   # Acute on chronic diastolic CHF - Diuretics as above  # Hypertension - acceptable control   # Anemia of CKD - received IV iron earlier this hospitalization.  S/p PRBC on 11/23.  S/p aranesp 40 mcg once on 11/24. On oral iron    # Left leg cellulitis - Status post vancomycin and previously on Bactrim as an outpatient - Would avoid Bactrim in advanced CKD and note that Vanc has been stopped  #Afib: on amiodarone infusion per cardiology. On eliquis.  Alison Chew, MD 07/10/2020 10:26 AM

## 2020-07-10 NOTE — Care Management Important Message (Signed)
Important Message  Patient Details  Name: [name] MRN: [id number] Date of Birth: 02/21/1951   Medicare Important Message Given:  Yes     Shelda Altes 07/10/2020, 8:53 AM

## 2020-07-10 NOTE — Progress Notes (Addendum)
Advanced Heart Failure Rounding Note  PCP-Cardiologist: Minus Breeding, MD  [facility]: Dr. Aundra Dubin   Subjective:    Went into afib w/ RVR yesterday. Started on amio gtt. Back in NSR on tele today. HR 70s.    Hgb stable. 9.2 today.   -1.1L in UOP charted yesterday but has had issues w/ incontinence. Wt down 6 lb. Scr overall better but starting to trend back up again, 3.21>>2.75>>2.94 today (baseline 2.8-3). Nephrology following and assisting w/ diuretics.  OOB and in chair today. Daughter at bedside. Feeling better. No resting dyspnea currently.      RIGHT HEART CATH 07/06/20    Right Heart Pressures RHC Procedural Findings: Hemodynamics (mmHg) RA mean 17 RV 53/18 PA 56/25, mean 36 PCWP mean 27  Oxygen saturations: PA 48% AO 96%  Cardiac Output (Fick) 6.43  Cardiac Index (Fick) 3.22  PVR 1.4 WU   Conclusion  1. Elevated right and left heart filling pressures.  2. Pulmonary venous hypertension.    2D Echo 07/07/20 1. Left ventricular ejection fraction, by estimation, is 60 to 65%. The left ventricle has normal function. The left ventricle has no regional wall motion abnormalities. There is mild concentric left ventricular hypertrophy. Diastolic function could not be assesed due to absence of atrial contraction (junctional rhythm?). 2. Right ventricular systolic function is normal. The right ventricular size is normal. There is severely elevated pulmonary artery systolic pressure. The estimated right ventricular systolic pressure is 24.2 mmHg. 3. Left atrial size was mildly dilated. 4. Moderate pericardial effusion. The pericardial effusion is circumferential. There is no evidence of cardiac tamponade. 5. The mitral valve is degenerative. Mild mitral valve regurgitation. Mild mitral stenosis. The mean mitral valve gradient is 5.0 mmHg. Moderate mitral annular calcification. 6. Gradients across the TAVR prosthesis have increased and dimensionless index has  worsened compared to 12/10/2019. The aortic valve has been repaired/replaced. Aortic valve regurgitation is not visualized. Aortic valve mean gradient measures 23.0 mmHg. Aortic valve Vmax measures 3.03 m/s. 7. The inferior vena cava is dilated in size with <50% respiratory variability, suggesting right atrial pressure of 15 mmHg.  Objective:   Weight Range: 86.2 kg Body mass index is 30.67 kg/m.   Vital Signs:   Temp:  [97.7 F (36.5 C)-98.6 F (37 C)] 97.7 F (36.5 C) (11/26 0759) Pulse Rate:  [70-90] 70 (11/26 0759) Resp:  [17-19] 18 (11/26 0759) BP: (114-147)/(48-83) 138/80 (11/26 0759) SpO2:  [93 %-97 %] 94 % (11/26 0759) Weight:  [86.2 kg] 86.2 kg (11/26 0458) Last BM Date: 07/09/20  Weight change: Filed Weights   07/08/20 1000 07/08/20 1300 07/10/20 0458  Weight: 88.9 kg 88.9 kg 86.2 kg    Intake/Output:   Intake/Output Summary (Last 24 hours) at 07/10/2020 0813 Last data filed at 07/10/2020 0721 Gross per 24 hour  Intake 471.47 ml  Output 1100 ml  Net -628.53 ml      Physical Exam    PHYSICAL EXAM: General:  Well appearing, obese sitting up in chair. No respiratory difficulty HEENT: normal Neck: supple. Thick neck, JVD not well visualized Carotids 2+ bilat; no bruits. No lymphadenopathy or thyromegaly appreciated. Cor: PMI nondisplaced. Regular rate & rhythm. No rubs, gallops or murmurs. Lungs: clear Abdomen: obese, soft, nontender, nondistended. No hepatosplenomegaly. No bruits or masses. Good bowel sounds. Extremities: no cyanosis, clubbing, rash, edema, s/p left transmetatarsal amputation, left posterior lower leg wound healing  Neuro: alert & oriented x 3, cranial nerves grossly intact. moves all 4 extremities w/o difficulty. Affect pleasant.  Telemetry   NSR 90s Personally reviewed  Labs    CBC Recent Labs    07/09/20 0022 07/10/20 0223  WBC 8.6 6.4  HGB 9.4* 9.2*  HCT 32.0* 31.2*  MCV 77.7* 78.2*  PLT 354 354   Basic Metabolic  Panel Recent Labs    07/09/20 0022 07/10/20 0223  NA 137 137  K 3.9 3.8  CL 96* 92*  CO2 29 30  GLUCOSE 82 238*  BUN 52* 58*  CREATININE 2.75* 2.94*  CALCIUM 8.5* 8.5*  MG 2.3 2.3   Liver Function Tests No results for input(s): AST, ALT, ALKPHOS, BILITOT, PROT, ALBUMIN in the last 72 hours. No results for input(s): LIPASE, AMYLASE in the last 72 hours. Cardiac Enzymes No results for input(s): CKTOTAL, CKMB, CKMBINDEX, TROPONINI in the last 72 hours.  BNP: BNP (last 3 results) Recent Labs    02/21/20 1151 06/03/20 1354 07/02/20 1628  BNP 538.2* 718.7* 1,102.7*    ProBNP (last 3 results) No results for input(s): PROBNP in the last 8760 hours.   D-Dimer No results for input(s): DDIMER in the last 72 hours. Hemoglobin A1C Recent Labs    07/09/20 0022  HGBA1C 6.4*   Fasting Lipid Panel No results for input(s): CHOL, HDL, LDLCALC, TRIG, CHOLHDL, LDLDIRECT in the last 72 hours. Thyroid Function Tests No results for input(s): TSH, T4TOTAL, T3FREE, THYROIDAB in the last 72 hours.  Invalid input(s): FREET3  Other results:   Imaging    No results found.   Medications:     Scheduled Medications: . sodium chloride   Intravenous Once  . apixaban  5 mg Oral BID  . docusate sodium  100 mg Oral BID  . fenofibrate  160 mg Oral Daily  . ferrous sulfate  325 mg Oral BID WC  . furosemide  80 mg Intravenous Once  . hydrALAZINE  50 mg Oral TID  . icosapent Ethyl  2 g Oral BID  . insulin aspart  0-9 Units Subcutaneous TID WC  . insulin NPH Human  10 Units Subcutaneous BID AC & HS  . pantoprazole  40 mg Oral BID  . sodium chloride flush  3 mL Intravenous Q12H  . Tafamidis  61 mg Oral Daily    Infusions: . amiodarone 30 mg/hr (07/09/20 2153)    PRN Medications: acetaminophen, meclizine, ondansetron (ZOFRAN) IV      Assessment/Plan    1.Acute/Chronic diastolic CHF: TEE in 6/56 showed EF 55-60%, mild LVH, stable bioprosthetic aortic valve s/p TAVR.PYP  scan in 2/20 suggestive of transthyretin amyloidosis, genetic testing suggestive of wild type. - RHC this admit showed elevated R/LH filling pressures. RAP 17. PCWP 27. IV Lasix resumed. Nephrology helping to dose diuretics. SCr back down to baseline.  - Per nephrology continue IV diuretics today 80 mg x 1. Appreciate their assistance  - follow BMP  2. CKD stage 3b:   - Creatinine baseline 2.6-2.8 - Creatinine on admit 3.5>3.5> 3.0 > 3.1>3.4>3.7>>3.2>2.75>>2.94    - nephrology following and dosing diuretics, continue IV Lasix today  - follow BMP  3. CAD: Cath in 8/19 with moderate RCA and LCx disease, severe distal LAD disease, medical management. - No s/s angina - No ASA given apixaban use.  - Continue pitivastatin 4 mg daily and Praluent.  - Continue fenofibrate and vascepa.  4. Atrial fibrillation: Paroxysmal.Went into afib w/ RVR on 11/25. Converted back to NSR on amio gtt - Remains in NSR today  - Eliquis held 11/19. Hgb 7.4 -> 7.7 -> 7.9->8.2->9.9->9.5->9.2 - Eliquis  restarted.  - continue IV amio today, transition back to amiodarone 200 mg daily tomorrow - TSH 1.2.  5. BZJ:IRCVELFYBOFB on admit.  She is off Coreg with junctional bradycardia at 7/21 admission.  - She cannot take amlodipine due to peripheral edema formation.  - Hold off on ACEI/ARB with CKD.  -Continue hydralazine 50 mg three times a day.  - BP improved today. Continue to monitor  6. PAD: S/p PTCA to right popliteal and right AT in 7/20. Now with left distal SFA-TP trunk bypass and left TMA in 2/21.  - She has followup with vascular surgery at [facility].  - Ordered peripheral arterial dopplers with LE ulcerations to see if there is worsening PAD. Limited study. Possible collateral noted at distal femoral artery. Popliteal artery appears occluded. Distal PT and AT appear occluded.  Unable to visualize bypass graft.  - VVS has seen. Given that patient has extensive history with vascular surgery of [name]  and that her left leg is not acutely ischemic, would recommend short order follow up with [facility] vascular surgery when medically stable for discharge home 7. 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  - She is on tafamidis, continue.   8. S/p TAVR: echo this admit shows gradients across the TAVR prosthesis have increased and dimensionless index has worsened compared to 12/10/2019. By TEE, The TAVR valve appears to open relatively normally, but has a significantly elevated mean gradient (29 mmHg).  The valve is relatively small and also suspect volume overload with high flow. The valve does not appear thrombosed. 9. Enterococcal endocarditis: Involving mitral valve, 7/21 TEE showed possible old calcified vegetation. TEE repeated this admit>>There is a 1.4 cm calcified nodule attached to the posterior leaflet (seen on prior studies).  This may be sequelae of remote endocarditis.  There is mild to moderate mitral regurgitation and mild mitral stenosis with mean gradient 5 mmHg but short pressure halftime. 10. Hyperlipidemia: Lipids good on 8/21 panel, TGs controlled now. Continue current regimen.  11. Mitral valve disease: TEE in 7/21 with mild-moderate MR, mild MS.  12. GERD: - ContinueProtonix 40 mg daily. 13. LLE Cellulitis - Completed 2 rounds or oral antibiotics  PTA  - Blood cultures obtained 07/02/20. NGTD - completed course of IV Vanc, now resolved.  14. DMII - 04/2020 Hgb A1C 9.9  - has had some hypoglycemia this admission  - TRH managing regimen  15. Anemia , Iron deficient - Followed by Dr Fuller Plan Had planned EGD/Colonoscopy but this was postponed due to respiratory studies.  - Has not had oral iron in a week.  - Hgb down to 7.4 this admit . No obvious source of bleeding             - MCV persistently low. Given Feraheme 11/21 - Dr. Henrene Pastor has seen recommend outpatient f/u - Eliquis  restarted. Hgb stable today at 9.2 - continue to follow. Transfuse for hgb <7.0.  26. Debility - continue to mobilize w/ PT - they are recommending [facility] at d/c. Will place order    Length of Stay: [address] [name]  07/10/2020, 8:13 AM  Advanced Heart Failure Team Pager [phone] (M-F; 7a - 4p)  Please contact [facility] for night-coverage after hours (4p -7a ) and weekends on [email or web address]  Patient seen and examined with the above-signed Advanced Practice Provider and/or Housestaff. I personally reviewed laboratory data, imaging studies and relevant notes. I independently examined the patient and formulated  the important aspects of the plan. I have edited the note to reflect any of my changes or salient points. I have personally discussed the plan with the patient and/or family.  Says she still feels weak. Mildly SOB. LLE hurts. Had AF with RVR yesterday. Back in NSR now on IV amio. Remains on Eliquis.   General:  Elderly woman sitting in chair No resp difficulty HEENT: normal Neck: supple. JVP hard to see. Looks mildly elevated Carotids 2+ bilat; no bruits. No lymphadenopathy or thryomegaly appreciated. Cor: PMI nondisplaced. Regular rate & rhythm. No rubs, gallops or murmurs. Lungs: clear Abdomen: soft, nontender, nondistended. No hepatosplenomegaly. No bruits or masses. Good bowel sounds. Extremities: no cyanosis, clubbing, rash, 1+ edema sores on LLE Neuro: alert & orientedx3, cranial nerves grossly intact. moves all 4 extremities w/o difficulty. Affect pleasant  Renal dosing IV lasix. Volume status looks ok. Creatinine elevated but close to baseline. Now back in NSR on IV amio. Will continue IV amio one more day. Continue Eliquis. Hgb stable. Follow closely.   Alison Bickers, MD  11:57 AM

## 2020-07-11 DIAGNOSIS — I5033 Acute on chronic diastolic (congestive) heart failure: Secondary | ICD-10-CM | POA: Diagnosis not present

## 2020-07-11 LAB — GLUCOSE, CAPILLARY
Glucose-Capillary: 155 mg/dL — ABNORMAL HIGH (ref 70–99)
Glucose-Capillary: 160 mg/dL — ABNORMAL HIGH (ref 70–99)
Glucose-Capillary: 194 mg/dL — ABNORMAL HIGH (ref 70–99)
Glucose-Capillary: 208 mg/dL — ABNORMAL HIGH (ref 70–99)
Glucose-Capillary: 227 mg/dL — ABNORMAL HIGH (ref 70–99)
Glucose-Capillary: 267 mg/dL — ABNORMAL HIGH (ref 70–99)
Glucose-Capillary: 366 mg/dL — ABNORMAL HIGH (ref 70–99)
Glucose-Capillary: 409 mg/dL — ABNORMAL HIGH (ref 70–99)

## 2020-07-11 LAB — BASIC METABOLIC PANEL
Anion gap: 13 (ref 5–15)
BUN: 60 mg/dL — ABNORMAL HIGH (ref 8–23)
CO2: 32 mmol/L (ref 22–32)
Calcium: 8.3 mg/dL — ABNORMAL LOW (ref 8.9–10.3)
Chloride: 90 mmol/L — ABNORMAL LOW (ref 98–111)
Creatinine, Ser: 3.2 mg/dL — ABNORMAL HIGH (ref 0.44–1.00)
GFR, Estimated: 15 mL/min — ABNORMAL LOW (ref >60)
Glucose, Bld: 160 mg/dL — ABNORMAL HIGH (ref 70–99)
Potassium: 3.3 mmol/L — ABNORMAL LOW (ref 3.5–5.1)
Sodium: 135 mmol/L (ref 135–145)

## 2020-07-11 LAB — CBC
HCT: 32.1 % — ABNORMAL LOW (ref 36.0–46.0)
Hemoglobin: 9.3 g/dL — ABNORMAL LOW (ref 12.0–15.0)
MCH: 22.7 pg — ABNORMAL LOW (ref 26.0–34.0)
MCHC: 29 g/dL — ABNORMAL LOW (ref 30.0–36.0)
MCV: 78.3 fL — ABNORMAL LOW (ref 80.0–100.0)
Platelets: 267 10*3/uL (ref 150–400)
RBC: 4.1 MIL/uL (ref 3.87–5.11)
RDW: 27.1 % — ABNORMAL HIGH (ref 11.5–15.5)
WBC: 6.4 10*3/uL (ref 4.0–10.5)
nRBC: 0 % (ref 0.0–0.2)

## 2020-07-11 MED ORDER — TORSEMIDE 20 MG PO TABS
20.0000 mg | ORAL_TABLET | Freq: Every evening | ORAL | Status: DC
  Administered 2020-07-11 – 2020-07-12 (×2): 20 mg via ORAL
  Filled 2020-07-11 (×2): qty 1

## 2020-07-11 MED ORDER — TORSEMIDE 20 MG PO TABS
40.0000 mg | ORAL_TABLET | Freq: Every day | ORAL | Status: DC
  Administered 2020-07-12 – 2020-07-13 (×2): 40 mg via ORAL
  Filled 2020-07-11 (×2): qty 2

## 2020-07-11 MED ORDER — INSULIN NPH (HUMAN) (ISOPHANE) 100 UNIT/ML ~~LOC~~ SUSP
15.0000 [IU] | Freq: Two times a day (BID) | SUBCUTANEOUS | Status: DC
  Administered 2020-07-11 – 2020-07-12 (×3): 15 [IU] via SUBCUTANEOUS

## 2020-07-11 MED ORDER — AMIODARONE HCL 200 MG PO TABS
200.0000 mg | ORAL_TABLET | Freq: Two times a day (BID) | ORAL | Status: DC
  Administered 2020-07-11 – 2020-07-13 (×5): 200 mg via ORAL
  Filled 2020-07-11 (×5): qty 1

## 2020-07-11 MED ORDER — POTASSIUM CHLORIDE CRYS ER 20 MEQ PO TBCR
20.0000 meq | EXTENDED_RELEASE_TABLET | Freq: Four times a day (QID) | ORAL | Status: AC
  Administered 2020-07-11 (×2): 20 meq via ORAL
  Filled 2020-07-11 (×2): qty 1

## 2020-07-11 NOTE — Progress Notes (Signed)
Patient ID: [name], female   DOB: February 20, 1951, 69 y.o.   MRN: [id number]     Advanced Heart Failure Rounding Note  PCP-Cardiologist: Minus Breeding, MD  [facility]: Dr. Aundra Dubin   Subjective:    Went into afib w/ RVR 11/25. Started on amio gtt. Back in NSR on telemetry. HR 70s.   Hgb stable at 9.3.   She got IV Lasix again yesterday, creatinine up to 3.2.  Weight trending down.   No complaints today, wants to go home.   RIGHT HEART CATH 07/06/20    Right Heart Pressures RHC Procedural Findings: Hemodynamics (mmHg) RA mean 17 RV 53/18 PA 56/25, mean 36 PCWP mean 27  Oxygen saturations: PA 48% AO 96%  Cardiac Output (Fick) 6.43  Cardiac Index (Fick) 3.22  PVR 1.4 WU   Conclusion  1. Elevated right and left heart filling pressures.  2. Pulmonary venous hypertension.    2D Echo 07/07/20 1. Left ventricular ejection fraction, by estimation, is 60 to 65%. The left ventricle has normal function. The left ventricle has no regional wall motion abnormalities. There is mild concentric left ventricular hypertrophy. Diastolic function could not be assesed due to absence of atrial contraction (junctional rhythm?). 2. Right ventricular systolic function is normal. The right ventricular size is normal. There is severely elevated pulmonary artery systolic pressure. The estimated right ventricular systolic pressure is 16.9 mmHg. 3. Left atrial size was mildly dilated. 4. Moderate pericardial effusion. The pericardial effusion is circumferential. There is no evidence of cardiac tamponade. 5. The mitral valve is degenerative. Mild mitral valve regurgitation. Mild mitral stenosis. The mean mitral valve gradient is 5.0 mmHg. Moderate mitral annular calcification. 6. Gradients across the TAVR prosthesis have increased and dimensionless index has worsened compared to 12/10/2019. The aortic valve has been repaired/replaced. Aortic valve regurgitation is not visualized. Aortic valve  mean gradient measures 23.0 mmHg. Aortic valve Vmax measures 3.03 m/s. 7. The inferior vena cava is dilated in size with <50% respiratory variability, suggesting right atrial pressure of 15 mmHg.  Objective:   Weight Range: 86.2 kg Body mass index is 30.67 kg/m.   Vital Signs:   Temp:  [97.6 F (36.4 C)-97.9 F (36.6 C)] 97.6 F (36.4 C) (11/27 0900) Pulse Rate:  [70-77] 75 (11/27 0900) Resp:  [18] 18 (11/27 0900) BP: (112-162)/(49-94) 125/51 (11/27 0900) SpO2:  [95 %-98 %] 95 % (11/27 0900) Last BM Date: 07/10/20  Weight change: Filed Weights   07/08/20 1000 07/08/20 1300 07/10/20 0458  Weight: 88.9 kg 88.9 kg 86.2 kg    Intake/Output:   Intake/Output Summary (Last 24 hours) at 07/11/2020 0950 Last data filed at 07/11/2020 0441 Gross per 24 hour  Intake --  Output 1600 ml  Net -1600 ml      Physical Exam    General: NAD Neck: Thick. No JVD, no thyromegaly or thyroid nodule.  Lungs: Clear to auscultation bilaterally with normal respiratory effort. CV: Nondisplaced PMI.  Heart regular S1/S2, no S3/S4, no murmur.  1+ edema left lower leg.   Abdomen: Soft, nontender, no hepatosplenomegaly, no distention.  Skin: Intact without lesions or rashes.  Neurologic: Alert and oriented x 3.  Psych: Normal affect. Extremities: No clubbing or cyanosis. S/p left TMA HEENT: Normal.    Telemetry   NSR 70s Personally reviewed  Labs    CBC Recent Labs    07/10/20 0223 07/11/20 0356  WBC 6.4 6.4  HGB 9.2* 9.3*  HCT 31.2* 32.1*  MCV 78.2* 78.3*  PLT 301 267  Basic Metabolic Panel Recent Labs    07/09/20 0022 07/09/20 0022 07/10/20 0223 07/11/20 0356  NA 137   < > 137 135  K 3.9   < > 3.8 3.3*  CL 96*   < > 92* 90*  CO2 29   < > 30 32  GLUCOSE 82   < > 238* 160*  BUN 52*   < > 58* 60*  CREATININE 2.75*   < > 2.94* 3.20*  CALCIUM 8.5*   < > 8.5* 8.3*  MG 2.3  --  2.3  --    < > = values in this interval not displayed.   Liver Function Tests No results  for input(s): AST, ALT, ALKPHOS, BILITOT, PROT, ALBUMIN in the last 72 hours. No results for input(s): LIPASE, AMYLASE in the last 72 hours. Cardiac Enzymes No results for input(s): CKTOTAL, CKMB, CKMBINDEX, TROPONINI in the last 72 hours.  BNP: BNP (last 3 results) Recent Labs    02/21/20 1151 06/03/20 1354 07/02/20 1628  BNP 538.2* 718.7* 1,102.7*    ProBNP (last 3 results) No results for input(s): PROBNP in the last 8760 hours.   D-Dimer No results for input(s): DDIMER in the last 72 hours. Hemoglobin A1C Recent Labs    07/09/20 0022  HGBA1C 6.4*   Fasting Lipid Panel No results for input(s): CHOL, HDL, LDLCALC, TRIG, CHOLHDL, LDLDIRECT in the last 72 hours. Thyroid Function Tests No results for input(s): TSH, T4TOTAL, T3FREE, THYROIDAB in the last 72 hours.  Invalid input(s): FREET3  Other results:   Imaging    No results found.   Medications:     Scheduled Medications: . sodium chloride   Intravenous Once  . amiodarone  200 mg Oral BID  . apixaban  5 mg Oral BID  . docusate sodium  100 mg Oral BID  . ferrous sulfate  325 mg Oral BID WC  . hydrALAZINE  50 mg Oral TID  . icosapent Ethyl  2 g Oral BID  . insulin aspart  0-9 Units Subcutaneous TID WC  . insulin NPH Human  15 Units Subcutaneous BID AC & HS  . pantoprazole  40 mg Oral BID  . potassium chloride  20 mEq Oral Q6H  . sodium chloride flush  3 mL Intravenous Q12H  . Tafamidis  61 mg Oral Daily  . torsemide  20 mg Oral QPM  . [START ON 07/12/2020] torsemide  40 mg Oral Q breakfast    Infusions:   PRN Medications: acetaminophen, meclizine, ondansetron (ZOFRAN) IV   Assessment/Plan: 1.Acute/Chronic diastolic CHF: TEE in 6/22 showed EF 55-60%, mild LVH, stable bioprosthetic aortic valve s/p TAVR.PYP scan in 2/20 suggestive of transthyretin amyloidosis, genetic testing suggestive of wild type. RHC this admit showed elevated R/LH filling pressures. She has had IV Lasix for a couple of  days, creatinine now trending up again to 3.2.  - Off IV Lasix now, would try torsemide 40 qam/20 qpm for now (was on 20 mg bid at home prior to admission).   - follow BMP  2. CKD stage 3b:  Creatinine baseline 2.6-2.8.  Creatinine up and down this admission, now back up to 3.2.  Nephrology following.  - Stop IV Lasix, torsemide as above.  3. CAD: Cath in 8/19 with moderate RCA and LCx disease, severe distal LAD disease, medical management.No chest pain.  - No ASA given apixaban use.  - Continue pitivastatin 4 mg daily and Praluent.  - Continue vascepa, will have to stop fenofibrate with renal dysfunction.  4. Atrial fibrillation: Paroxysmal.Went into afib w/ RVR on 11/25. Converted back to NSR on amio gtt.  Remains in NSR today.  - Continue Eliquis.   - Transition amiodarone to 200 mg po bid.   5. CVE:[id number] on admit. She is off Coreg with junctional bradycardia at 7/21 admission. She cannot take amlodipine due to peripheral edema formation. BP now stable.  - Hold off on ACEI/ARB with CKD.  -Continue hydralazine 50 mg three times a day.  6. PAD: S/p PTCA to right popliteal and right AT in 7/20. Now with left distal SFA-TP trunk bypass and left TMA in 2/21. Ordered peripheral arterial dopplers with LE ulcerations to see if there is worsening PAD. Limited study. Left popliteal artery appears occluded. Distal PT and AT appear occluded. Unable to visualize bypass graft. VVS has seen. Given that patient has extensive history with vascular surgery of [name] and that her left leg is not acutely ischemic, recommended short order follow up with [facility] vascular surgery when medically stable for discharge home - She has followup with vascular surgery at [facility].  7. 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  - She is on tafamidis, continue.   8. S/p TAVR: echo this admit shows gradients across the  TAVR prosthesis have increased and dimensionless index has worsened compared to 12/10/2019. By TEE, The TAVR valve appears to open relatively normally, but has a significantly elevated mean gradient (29 mmHg).  The valve is relatively small and also suspect volume overload with high flow. The valve does not appear thrombosed. 9. Enterococcal endocarditis: Involving mitral valve, 7/21 TEE showed possible old calcified vegetation. TEE repeated this admit>>There is a 1.4 cm calcified nodule attached to the posterior leaflet (seen on prior studies).  This may be sequelae of remote endocarditis.  There is mild to moderate mitral regurgitation and mild mitral stenosis with mean gradient 5 mmHg but short pressure halftime. 10. Hyperlipidemia: Lipids good on 8/21 panel, TGs controlled now. Continue current regimen (except off fenofibrate with renal dysfunction).  11. Mitral valve disease: TEE with mild-moderate MR, mild MS.  12. GERD: - ContinueProtonix 40 mg daily. 13. LLE Cellulitis: Completed 2 rounds or oral antibiotics PTA. Blood cultures obtained 07/02/20. NGTD completed course of IV Vanc, now resolved.  14. DMII: 04/2020 Hgb A1C 9.9. Has had some hypoglycemia this admission.  - TRH managing regimen  15. Anemia , Iron deficient: Followed by Dr Fuller Plan Had planned EGD/Colonoscopy but this was postponed due to respiratory studies. Hgb down to 7.4 this admit . No obvious source of bleeding.  MCV persistently low. Given Feraheme 11/21. Eliquis restarted. Hgb stable today at 9.3.  - Dr. Henrene Pastor has seen, recommend outpatient f/u  16. Debility - continue to mobilize w/ PT - they are recommending [facility] at d/c. Will place order   [name] 07/11/2020 10:01 AM

## 2020-07-11 NOTE — Progress Notes (Signed)
[facility] Progress Note  Name: [name] MRN: [id number] DOB: Nov 13, 1950  Chief Complaint:  Shortness of breath, leg swelling, fluid overload  Subjective:  IV Lasix yesterday with 1.6 L of urine output.  Creatinine slightly increased to 3.2 today.  Patient states that she urinated well.  Continues to have some shortness of breath.  Review of systems:  12 systems reviewed and negative except per subjective  ------    Intake/Output Summary (Last 24 hours) at 07/11/2020 1013 Last data filed at 07/11/2020 0441 Gross per 24 hour  Intake --  Output 1600 ml  Net -1600 ml    Vitals:  Vitals:   07/10/20 2051 07/10/20 2224 07/11/20 0439 07/11/20 0900  BP: (!) 162/60 (!) 133/94 (!) 112/49 (!) 125/51  Pulse: 70  75 75  Resp: 18  18 18   Temp: 97.8 F (36.6 C)  97.9 F (36.6 C) 97.6 F (36.4 C)  TempSrc: Oral  Oral Oral  SpO2: 96%  98% 95%  Weight:      Height:         Physical Exam:   General: Adult female in bed obese habitus no acute distress at rest HEENT: Normocephalic atraumatic Eyes: Extraocular movements intact sclera anicteric Neck: Increased neck circumference Heart: S1-S2 no rub appreciated Lungs:  Clear to auscultation bilaterally, bilateral chest rise, no increased work of breathing Abdomen: Softly distended/obese/nontender Extremities: Trace edema bilateral lower extremities  Neuro: Alert and oriented x3 provides a history and follows commands Psych normal mood and affect  Medications reviewed   Labs:  BMP Latest Ref Rng & Units 07/11/2020 07/10/2020 07/09/2020  Glucose 70 - 99 mg/dL 160(H) 238(H) 82  BUN 8 - 23 mg/dL 60(H) 58(H) 52(H)  Creatinine 0.44 - 1.00 mg/dL 3.20(H) 2.94(H) 2.75(H)  BUN/Creat Ratio 12 - 28 - - -  Sodium 135 - 145 mmol/L 135 137 137  Potassium 3.5 - 5.1 mmol/L 3.3(L) 3.8 3.9  Chloride 98 - 111 mmol/L 90(L) 92(L) 96(L)  CO2 22 - 32 mmol/L 32 30 29  Calcium 8.9 - 10.3 mg/dL 8.3(L) 8.5(L) 8.5(L)      Assessment/Plan:   # AKI on CKD 4: Baseline creatinine 2.8-3.2 multifactorial.  AKI likely related to cardiorenal syndrome.  Creatinine fluctuating which is common with diuresis - Continue monitor creatinine trend after switching diuretics -Agree with Dr. Aundra Dubin on torsemide 40/20 a.m./p.m. - Would hold on arb at DC and restart outpt when stable - Would defer fenofibrate given her advanced CKD   # CKD stage IV  - Secondary to DM, HTN and CHF  - Baseline Cr 2.8 - 3.2 - Follows with Dr. Joelyn [facility]ms at [facility]   # Acute on chronic diastolic CHF - Diuretics as above  # Hypertension - acceptable control   # Anemia of CKD - received IV iron earlier this hospitalization.  S/p PRBC on 11/23.  S/p aranesp 40 mcg once on 11/24. On oral iron    # Left leg dermatitis - Status post vancomycin and previously on Bactrim as an outpatient. Likely stasis dermatitis - Would avoid Bactrim in advanced CKD and note that Vanc has been stopped  #Afib: on amiodarone infusion per cardiology. On eliquis.  Reesa Chew, MD 07/11/2020 10:13 AM

## 2020-07-11 NOTE — Progress Notes (Addendum)
PROGRESS NOTE    [name]  JIR:[id number] DOB: [birth year] DOA: 07/02/2020 PCP: [name], DO    Brief Narrative:  [name] is a 69 year old female with past medical history significant for type 2 diabetes mellitus, OSA, left ear s/p TAVR, obesity s/p gastric sleeve, CAD, essential hypertension, hyperlipidemia, stage IV CKD, chronic diastolic CHF, A. fib on Eliquis who was admitted on 07/02/2020 from a CHF clinic for acute on chronic diastolic congestive heart failure and questionable left lower extremity cellulitis.  Hospital service was consulted for possible left lower extremity cellulitis, with failed outpatient treatment with Keflex.   Assessment & Plan:   Principal Problem:   Acute on chronic diastolic heart failure (HCC) Active Problems:   Hyperlipidemia associated with type 2 diabetes mellitus (HCC)   Benign essential HTN   Coronary artery disease, non-occlusive   CKD stage 3 due to type 2 diabetes mellitus (HCC)   Cellulitis of left lower leg   Left lower extremity stasis dermatitis with ulcerations vs cellulitis Etiology likely suspicious for stasis dermatitis versus peripheral vascular disease.  Patient completed 5-day course of IV vancomycin.  Patient is afebrile without leukocytosis.  Left lower extremity vascular duplex arterial ultrasound 11/22 with popliteal artery appears occluded, distal PT and AT appear occluded unable to visualize bypass graft with possible collateral noted at distal femoral artery.  Patient has undergone a previous left lower extremity bypass graft 1 year ago that appears occluded.  Seen by vascular surgery, Dr. Scot Dock on 11/23.  This appears to be a chronic and not an acute occlusion.  Patient follows with vascular surgery at [facility].  Given that they are heavily involved in her care, vascular surgery does not see any acute indication for arteriography or further work-up at this time and recommend follow-up with her  primary vascular surgeon at [facility] following discharge. --continue Vascepa 2g PO BID  Acute on chronic diastolic congestive heart failure, decompensated BNP elevated 116.6 on admission.  TTE 07/07/2020 with LVEF 60 to 65%, no regional wall motion normalities, mild concentric LVH, moderate pericardial effusion without evidence of tamponade, mild mitral stenosis, IVC dilated in size.  TEE 07/08/2020 with LVEF 55%, mild LVH, LA mildly dilated, 1.4 cm calcified nodule posterior leaflet mitral valve, mild/moderate MR, no PFO, normal-appearing bioprosthetic aortic valve. 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   --Cardiology transitioning amiodarone drip back to amiodarone 200 mg p.o. twice daily today --Eliquis 5 mg p.o. twice daily  Essential pretension BP 120/53 this morning, well controlled. --Hydralazine 50 mg TID  Hyperlipidemia --On fenofibrate 160 mg p.o. daily, nephrology recommends discontinuation given renal function --continue vascepa 2g PO BID  Type 2 diabetes mellitus Hemoglobin A1c 6.4. Home regimen includes NPH 55 units with breakfast and lunch, 20 units with dinner.  Patient with intermittent hypoglycemic episodes, stemming from prolonged n.p.o. status awaiting TEE in which she received her home regimen of insulin. Now glucose trending up. --Increase NPH to 15u [place] BID --Insulin sliding scale for further coverage --Continue close monitoring of CBGs every 4 hours  AKI on CKD stage IV Baseline creatinine 2.8-3.0.  Follows with Dr.  Joelyn Oms, nephrology  outpatient. --Nephrology following, appreciate assistance --Cr 3.54>3.49>3.03>3.12>3.37>3.72>3.21>2.75>2.94>3.20 --torsemide 60mg  PO qAM and 20mg  PO qPM --Avoid nephrotoxins, renally dose all medications --Strict I's notes --Monitor renal function closely daily  Anemia of chronic kidney disease Iron 78, TIBC 468, ferritin 30, folate 11.8, B12 513.  FOBT positive.  Evaluated by  GI, Dr Henrene Pastor on 07/03/2020 with recommendations of increased Protonix to 40 mg twice daily, oral iron therapy and no indication for endoscopic procedures at this time. --s/p IV Feraheme 11/21 --s/p 1u PRBC on 11/23 --s/p aranesp 40 mcg on 11/24 --Hgb 8.2>9.9>9.9>9.5>7.8>9.4>9.3 --Continue Protonix 40 mg p.o. twice daily --Ferrous sulfate 325 mg p.o. twice daily --Outpatient follow-up with GI, Dr. Fuller Plan  Obesity Body mass index is 30.67 kg/m.  Discussed need for lifestyle changes/weight loss as this complicates all facets of care.  Weakness, deconditioning, debility: --PT recommends home health on dc   DVT prophylaxis: Eliquis Code Status: Full code Family Communication: No family present at bedside this morning  Disposition Plan: Per primary, cardiology, heart failure service   Consultants:   Hospitalist  Nephrology  Vascular surgery  Procedures:   Right heart catheterization 11/22  Vascular ultrasound duplex lower extremity arterial 11/22  TTE 11/24  TEE 11/24:  Antimicrobials:   Vancomycin 11/18 - 11/23   Subjective: Patient seen and examined bedside, resting comfortably.  Sitting up at bedside.  Cardiology transitioning IV Lasix to torsemide and IV amiodarone drip to oral.  Patient desires to discharge home.  No other specific complaints at this time. Denies headache, no visual changes, no chest pain, no palpitations, no shortness of breath, no abdominal pain.  No other concerns other than hyperglycemia per nursing staff this morning.  Objective: Vitals:   07/10/20 2051  07/10/20 2224 07/11/20 0439 07/11/20 0900  BP: (!) 162/60 (!) 133/94 (!) 112/49 (!) 125/51  Pulse: 70  75 75  Resp: 18  18 18   Temp: 97.8 F (36.6 C)  97.9 F (36.6 C) 97.6 F (36.4 C)  TempSrc: Oral  Oral Oral  SpO2: 96%  98% 95%  Weight:      Height:        Intake/Output Summary (Last 24 hours) at 07/11/2020 1334 Last data filed at 07/11/2020 0441 Gross per 24 hour  Intake --  Output 1600 ml  Net -1600 ml   Filed Weights   07/08/20 1000 07/08/20 1300 07/10/20 0458  Weight: 88.9 kg 88.9 kg 86.2 kg    Examination:  General exam: Appears calm and comfortable  Respiratory system: Clear to auscultation. Respiratory effort normal.  Oxygenating well on room air Cardiovascular system: S1 & S2 heard, irregularly irregular rhythm, normal rate. No JVD, murmurs, rubs, gallops or clicks. No pedal edema. Gastrointestinal system: Abdomen is nondistended, soft and nontender. No organomegaly or masses felt. Normal bowel sounds heard. Central nervous system: Alert and oriented. No focal neurological deficits. Extremities: Symmetric 5 x 5 power, noted left TMA Skin: No rashes, lesions or ulcers Psychiatry: Judgement and insight appear normal. Mood & affect appropriate.     Data Reviewed: I have personally reviewed following labs and imaging studies  CBC: Recent Labs  Lab 07/07/20 0204 07/08/20 1540 07/09/20 0022 07/10/20 0223 07/11/20 0356  WBC 8.9 9.4 8.6 6.4 6.4  HGB 7.8* 9.5* 9.4* 9.2* 9.3*  HCT 27.4* 32.5* 32.0* 31.2* 32.1*  MCV 73.9* 77.0* 77.7* 78.2* 78.3*  PLT 342 389 354 301 676   Basic Metabolic Panel: Recent Labs  Lab 07/07/20 0204 07/08/20 0912 07/09/20 0022 07/10/20 0223 07/11/20  0356  NA 135 139 137 137 135  K 3.9 3.7 3.9 3.8 3.3*  CL 97* 100 96* 92* 90*  CO2 25 28 29 30  32  GLUCOSE 240* 97 82 238* 160*  BUN 61* 58* 52* 58* 60*  CREATININE 3.72* 3.21* 2.75* 2.94* 3.20*  CALCIUM 8.3* 8.6* 8.5* 8.5* 8.3*  MG  --   --  2.3 2.3  --    GFR: Estimated  Creatinine Clearance: 18.4 mL/min (A) (by C-G formula based on SCr of 3.2 mg/dL (H)). Liver Function Tests: No results for input(s): AST, ALT, ALKPHOS, BILITOT, PROT, ALBUMIN in the last 168 hours. No results for input(s): LIPASE, AMYLASE in the last 168 hours. No results for input(s): AMMONIA in the last 168 hours. Coagulation Profile: No results for input(s): INR, PROTIME in the last 168 hours. Cardiac Enzymes: No results for input(s): CKTOTAL, CKMB, CKMBINDEX, TROPONINI in the last 168 hours. BNP (last 3 results) No results for input(s): PROBNP in the last 8760 hours. HbA1C: Recent Labs    07/09/20 0022  HGBA1C 6.4*   CBG: Recent Labs  Lab 07/10/20 2049 07/11/20 0048 07/11/20 0428 07/11/20 0722 07/11/20 1154  GLUCAP 262* 208* 160* 155* 227*   Lipid Profile: No results for input(s): CHOL, HDL, LDLCALC, TRIG, CHOLHDL, LDLDIRECT in the last 72 hours. Thyroid Function Tests: No results for input(s): TSH, T4TOTAL, FREET4, T3FREE, THYROIDAB in the last 72 hours. Anemia Panel: No results for input(s): VITAMINB12, FOLATE, FERRITIN, TIBC, IRON, RETICCTPCT in the last 72 hours. Sepsis Labs: No results for input(s): PROCALCITON, LATICACIDVEN in the last 168 hours.  Recent Results (from the past 240 hour(s))  Culture, blood (routine x 2)     Status: None   Collection Time: 07/02/20  4:28 PM   Specimen: BLOOD  Result Value Ref Range Status   Specimen Description BLOOD RIGHT ANTECUBITAL  Final   Special Requests   Final    BOTTLES DRAWN AEROBIC AND ANAEROBIC Blood Culture adequate volume   Culture   Final    NO GROWTH 5 DAYS Performed at [facility], [address] [place], [place] [address]    Report Status 07/07/2020 FINAL  Final  Culture, blood (routine x 2)     Status: None   Collection Time: 07/02/20  4:31 PM   Specimen: BLOOD  Result Value Ref Range Status   Specimen Description BLOOD LEFT ANTECUBITAL  Final   Special Requests   Final    BOTTLES DRAWN AEROBIC  AND ANAEROBIC Blood Culture adequate volume   Culture   Final    NO GROWTH 5 DAYS Performed at [facility], [place] [address], [place],  [address]    Report Status 07/07/2020 FINAL  Final  Respiratory Panel by RT PCR (Flu A&B, Covid) - Nasopharyngeal Swab     Status: None   Collection Time: 07/02/20  4:37 PM   Specimen: Nasopharyngeal Swab; Nasopharyngeal(NP) swabs in vial transport medium  Result Value Ref Range Status   SARS Coronavirus 2 by RT PCR NEGATIVE NEGATIVE Final    Comment: (NOTE) SARS-CoV-2 target nucleic acids are NOT DETECTED.  The SARS-CoV-2 RNA is generally detectable in upper respiratoy specimens during the acute phase of infection. The lowest concentration of SARS-CoV-2 viral copies this assay can detect is 131 copies/mL. A negative result does not preclude SARS-Cov-2 infection and should not be used as the sole basis for treatment or other patient management decisions. A negative result may occur with  improper specimen collection/handling, submission of  specimen other than nasopharyngeal swab, presence of viral mutation(s) within the areas targeted by this assay, and inadequate number of viral copies (<131 copies/mL). A negative result must be combined with clinical observations, patient history, and epidemiological information. The expected result is Negative.  Fact Sheet for Patients:  PinkCheek.be  Fact Sheet for Healthcare Providers:  GravelBags.it  This test is no t yet approved or cleared by the [place] FDA and  has been authorized for detection and/or diagnosis of SARS-CoV-2 by FDA under an Emergency Use Authorization (EUA). This EUA will remain  in effect (meaning this test can be used) for the duration of the COVID-19 declaration under Section 564(b)(1) of the Act, 21 U.S.C. section 360bbb-3(b)(1), unless the authorization is terminated or revoked sooner.     Influenza A  by PCR NEGATIVE NEGATIVE Final   Influenza B by PCR NEGATIVE NEGATIVE Final    Comment: (NOTE) The Xpert Xpress SARS-CoV-2/FLU/RSV assay is intended as an aid in  the diagnosis of influenza from Nasopharyngeal swab specimens and  should not be used as a sole basis for treatment. Nasal washings and  aspirates are unacceptable for Xpert Xpress SARS-CoV-2/FLU/RSV  testing.  Fact Sheet for Patients: PinkCheek.be  Fact Sheet for Healthcare Providers: GravelBags.it  This test is not yet approved or cleared by the [place] FDA and  has been authorized for detection and/or diagnosis of SARS-CoV-2 by  FDA under an Emergency Use Authorization (EUA). This EUA will remain  in effect (meaning this test can be used) for the duration of the  Covid-19 declaration under Section 564(b)(1) of the Act, 21  U.S.C. section 360bbb-3(b)(1), unless the authorization is  terminated or revoked. Performed at [facility], [place] [address], [place], [place] [address]          Radiology Studies: No results found.      Scheduled Meds: . sodium chloride   Intravenous Once  . amiodarone  200 mg Oral BID  . apixaban  5 mg Oral BID  . docusate sodium  100 mg Oral BID  . ferrous sulfate  325 mg Oral BID WC  . hydrALAZINE  50 mg Oral TID  . icosapent Ethyl  2 g Oral BID  . insulin aspart  0-9 Units Subcutaneous TID WC  . insulin NPH Human  15 Units Subcutaneous BID AC & HS  . pantoprazole  40 mg Oral BID  . sodium chloride flush  3 mL Intravenous Q12H  . Tafamidis  61 mg Oral Daily  . torsemide  20 mg Oral QPM  . [START ON 07/12/2020] torsemide  40 mg Oral Q breakfast   Continuous Infusions:    LOS: 9 days    Time spent: 39 minutes spent on chart review, discussion with nursing staff, consultants, updating family and interview/physical exam; more than 50% of that time was spent in counseling and/or coordination of  care.    Ahniya Mitchum J [place] DO [facility] Available via Epic secure chat 7am-7pm After these hours, please refer to coverage provider listed on [email or web address] 07/11/2020, 1:34 PM

## 2020-07-12 ENCOUNTER — Encounter (HOSPITAL_COMMUNITY): Payer: Self-pay | Admitting: Cardiology

## 2020-07-12 DIAGNOSIS — I5033 Acute on chronic diastolic (congestive) heart failure: Secondary | ICD-10-CM | POA: Diagnosis not present

## 2020-07-12 LAB — GLUCOSE, CAPILLARY
Glucose-Capillary: 97 mg/dL (ref 70–99)
Glucose-Capillary: 87 mg/dL (ref 70–99)
Glucose-Capillary: 161 mg/dL — ABNORMAL HIGH (ref 70–99)
Glucose-Capillary: 289 mg/dL — ABNORMAL HIGH (ref 70–99)
Glucose-Capillary: 293 mg/dL — ABNORMAL HIGH (ref 70–99)

## 2020-07-12 LAB — CBC
HCT: 33.7 % — ABNORMAL LOW (ref 36.0–46.0)
Hemoglobin: 9.8 g/dL — ABNORMAL LOW (ref 12.0–15.0)
MCH: 23 pg — ABNORMAL LOW (ref 26.0–34.0)
MCHC: 29.1 g/dL — ABNORMAL LOW (ref 30.0–36.0)
MCV: 78.9 fL — ABNORMAL LOW (ref 80.0–100.0)
Platelets: 251 10*3/uL (ref 150–400)
RBC: 4.27 MIL/uL (ref 3.87–5.11)
RDW: 27.7 % — ABNORMAL HIGH (ref 11.5–15.5)
WBC: 6.5 10*3/uL (ref 4.0–10.5)
nRBC: 0 % (ref 0.0–0.2)

## 2020-07-12 LAB — BASIC METABOLIC PANEL
Anion gap: 13 (ref 5–15)
BUN: 62 mg/dL — ABNORMAL HIGH (ref 8–23)
CO2: 34 mmol/L — ABNORMAL HIGH (ref 22–32)
Calcium: 8.7 mg/dL — ABNORMAL LOW (ref 8.9–10.3)
Chloride: 92 mmol/L — ABNORMAL LOW (ref 98–111)
Creatinine, Ser: 3.08 mg/dL — ABNORMAL HIGH (ref 0.44–1.00)
GFR, Estimated: 16 mL/min — ABNORMAL LOW (ref >60)
Glucose, Bld: 149 mg/dL — ABNORMAL HIGH (ref 70–99)
Potassium: 3.4 mmol/L — ABNORMAL LOW (ref 3.5–5.1)
Sodium: 139 mmol/L (ref 135–145)

## 2020-07-12 MED ORDER — INSULIN NPH (HUMAN) (ISOPHANE) 100 UNIT/ML ~~LOC~~ SUSP
20.0000 [IU] | Freq: Two times a day (BID) | SUBCUTANEOUS | Status: DC
  Administered 2020-07-12: 20 [IU] via SUBCUTANEOUS

## 2020-07-12 MED ORDER — POTASSIUM CHLORIDE CRYS ER 20 MEQ PO TBCR
40.0000 meq | EXTENDED_RELEASE_TABLET | Freq: Once | ORAL | Status: AC
  Administered 2020-07-12: 40 meq via ORAL
  Filled 2020-07-12: qty 2

## 2020-07-12 NOTE — Progress Notes (Signed)
Patient ID: [name], female   DOB: 17-Nov-1950, 69 y.o.   MRN: [id number]     Advanced Heart Failure Rounding Note  PCP-Cardiologist: Minus Breeding, MD  [facility]: Dr. Aundra Dubin   Subjective:    Went into afib w/ RVR 11/25. Started on amio gtt, now on po amiodarone. In NSR on telemetry. HR 70s.   Hgb stable at 9.8.   Creatinine down to 3.08 today and weight lower.   No complaints today.   RIGHT HEART CATH 07/06/20    Right Heart Pressures RHC Procedural Findings: Hemodynamics (mmHg) RA mean 17 RV 53/18 PA 56/25, mean 36 PCWP mean 27  Oxygen saturations: PA 48% AO 96%  Cardiac Output (Fick) 6.43  Cardiac Index (Fick) 3.22  PVR 1.4 WU   Conclusion  1. Elevated right and left heart filling pressures.  2. Pulmonary venous hypertension.    2D Echo 07/07/20 1. Left ventricular ejection fraction, by estimation, is 60 to 65%. The left ventricle has normal function. The left ventricle has no regional wall motion abnormalities. There is mild concentric left ventricular hypertrophy. Diastolic function could not be assesed due to absence of atrial contraction (junctional rhythm?). 2. Right ventricular systolic function is normal. The right ventricular size is normal. There is severely elevated pulmonary artery systolic pressure. The estimated right ventricular systolic pressure is 46.2 mmHg. 3. Left atrial size was mildly dilated. 4. Moderate pericardial effusion. The pericardial effusion is circumferential. There is no evidence of cardiac tamponade. 5. The mitral valve is degenerative. Mild mitral valve regurgitation. Mild mitral stenosis. The mean mitral valve gradient is 5.0 mmHg. Moderate mitral annular calcification. 6. Gradients across the TAVR prosthesis have increased and dimensionless index has worsened compared to 12/10/2019. The aortic valve has been repaired/replaced. Aortic valve regurgitation is not visualized. Aortic valve mean gradient measures 23.0 mmHg.  Aortic valve Vmax measures 3.03 m/s. 7. The inferior vena cava is dilated in size with <50% respiratory variability, suggesting right atrial pressure of 15 mmHg.  Objective:   Weight Range: 83.5 kg Body mass index is 29.7 kg/m.   Vital Signs:   Temp:  [97.9 F (36.6 C)-98.9 F (37.2 C)] 97.9 F (36.6 C) (11/28 0649) Pulse Rate:  [68-76] 76 (11/28 0649) Resp:  [16-19] 19 (11/28 0649) BP: (130-144)/(42-52) 144/44 (11/28 1001) SpO2:  [90 %-95 %] 92 % (11/28 0649) Weight:  [83.5 kg] 83.5 kg (11/28 0649) Last BM Date: 07/10/20  Weight change: Filed Weights   07/08/20 1300 07/10/20 0458 07/12/20 0649  Weight: 88.9 kg 86.2 kg 83.5 kg    Intake/Output:   Intake/Output Summary (Last 24 hours) at 07/12/2020 1057 Last data filed at 07/12/2020 0940 Gross per 24 hour  Intake 510 ml  Output 1851 ml  Net -1341 ml      Physical Exam    General: NAD Neck: Thick, no JVD, no thyromegaly or thyroid nodule.  Lungs: Clear to auscultation bilaterally with normal respiratory effort. CV: Nondisplaced PMI.  Heart regular S1/S2, no S3/S4, no murmur.  No peripheral edema.   Abdomen: Soft, nontender, no hepatosplenomegaly, no distention.  Skin: Intact without lesions or rashes.  Neurologic: Alert and oriented x 3.  Psych: Normal affect. Extremities: No clubbing or cyanosis. S/p left TMA HEENT: Normal.    Telemetry   NSR 70s. Personally reviewed  Labs    CBC Recent Labs    07/11/20 0356 07/12/20 0202  WBC 6.4 6.5  HGB 9.3* 9.8*  HCT 32.1* 33.7*  MCV 78.3* 78.9*  PLT 267 251  Basic Metabolic Panel Recent Labs    07/10/20 0223 07/10/20 0223 07/11/20 0356 07/12/20 0202  NA 137   < > 135 139  K 3.8   < > 3.3* 3.4*  CL 92*   < > 90* 92*  CO2 30   < > 32 34*  GLUCOSE 238*   < > 160* 149*  BUN 58*   < > 60* 62*  CREATININE 2.94*   < > 3.20* 3.08*  CALCIUM 8.5*   < > 8.3* 8.7*  MG 2.3  --   --   --    < > = values in this interval not displayed.   Liver Function  Tests No results for input(s): AST, ALT, ALKPHOS, BILITOT, PROT, ALBUMIN in the last 72 hours. No results for input(s): LIPASE, AMYLASE in the last 72 hours. Cardiac Enzymes No results for input(s): CKTOTAL, CKMB, CKMBINDEX, TROPONINI in the last 72 hours.  BNP: BNP (last 3 results) Recent Labs    02/21/20 1151 06/03/20 1354 07/02/20 1628  BNP 538.2* 718.7* 1,102.7*    ProBNP (last 3 results) No results for input(s): PROBNP in the last 8760 hours.   D-Dimer No results for input(s): DDIMER in the last 72 hours. Hemoglobin A1C No results for input(s): HGBA1C in the last 72 hours. Fasting Lipid Panel No results for input(s): CHOL, HDL, LDLCALC, TRIG, CHOLHDL, LDLDIRECT in the last 72 hours. Thyroid Function Tests No results for input(s): TSH, T4TOTAL, T3FREE, THYROIDAB in the last 72 hours.  Invalid input(s): FREET3  Other results:   Imaging    No results found.   Medications:     Scheduled Medications: . sodium chloride   Intravenous Once  . amiodarone  200 mg Oral BID  . apixaban  5 mg Oral BID  . docusate sodium  100 mg Oral BID  . ferrous sulfate  325 mg Oral BID WC  . hydrALAZINE  50 mg Oral TID  . icosapent Ethyl  2 g Oral BID  . insulin aspart  0-9 Units Subcutaneous TID WC  . insulin NPH Human  15 Units Subcutaneous BID AC & HS  . pantoprazole  40 mg Oral BID  . potassium chloride  40 mEq Oral Once  . sodium chloride flush  3 mL Intravenous Q12H  . Tafamidis  61 mg Oral Daily  . torsemide  20 mg Oral QPM  . torsemide  40 mg Oral Q breakfast    Infusions:   PRN Medications: acetaminophen, meclizine, ondansetron (ZOFRAN) IV   Assessment/Plan: 1.Acute/Chronic diastolic CHF: TEE in 8/11 showed EF 55-60%, mild LVH, stable bioprosthetic aortic valve s/p TAVR.PYP scan in 2/20 suggestive of transthyretin amyloidosis, genetic testing suggestive of wild type. RHC this admit showed elevated R/LH filling pressures. Weight down with diuresis.  Think  she is near-euvolemic.  Now on po torsemide. Creatinine lower at 3.08 today.  - Continue torsemide 40 qam/20 qpm (was on 20 mg bid at home prior to admission).   - follow BMP  2. CKD stage 3b:  Creatinine baseline 2.6-2.8.  Creatinine up and down this admission, now lower at 3.08.  Nephrology following.  - Continue torsemide as above.  3. CAD: Cath in 8/19 with moderate RCA and LCx disease, severe distal LAD disease, medical management.No chest pain.  - No ASA given apixaban use.  - Continue pitivastatin 4 mg daily and Praluent.  - Continue vascepa, stopped fenofibrate with renal dysfunction.  4. Atrial fibrillation: Paroxysmal.Went into afib w/ RVR on 11/25. Converted back to NSR  on amio gtt.  Remains in NSR today.  - Continue Eliquis.   - Continue amiodarone 200 mg po bid x 1 week then back to qd.   5. CBU:LAGTXMIWOEHO on admit. She is off Coreg with junctional bradycardia at 7/21 admission. She cannot take amlodipine due to peripheral edema formation. BP now stable.  - Hold off on ACEI/ARB with CKD.  -Continue hydralazine 50 mg three times a day.  6. PAD: S/p PTCA to right popliteal and right AT in 7/20. Now with left distal SFA-TP trunk bypass and left TMA in 2/21. Ordered peripheral arterial dopplers with LE ulcerations to see if there is worsening PAD. Limited study. Left popliteal artery appears occluded. Distal PT and AT appear occluded. Unable to visualize bypass graft. VVS has seen. Given that patient has extensive history with vascular surgery of [name] and that her left leg is not acutely ischemic, recommended short order follow up with [facility] vascular surgery when medically stable for discharge home - She has followup with vascular surgery at [facility].  7. 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  - She is on tafamidis, continue.   8. S/p TAVR: echo this admit shows gradients  across the TAVR prosthesis have increased and dimensionless index has worsened compared to 12/10/2019. By TEE, The TAVR valve appears to open relatively normally, but has a significantly elevated mean gradient (29 mmHg).  The valve is relatively small and also suspect volume overload with high flow. The valve does not appear thrombosed. 9. Enterococcal endocarditis: Involving mitral valve, 7/21 TEE showed possible old calcified vegetation. TEE repeated this admit>>There is a 1.4 cm calcified nodule attached to the posterior leaflet (seen on prior studies).  This may be sequelae of remote endocarditis.  There is mild to moderate mitral regurgitation and mild mitral stenosis with mean gradient 5 mmHg but short pressure halftime. 10. Hyperlipidemia: Lipids good on 8/21 panel, TGs controlled now. Continue current regimen (except off fenofibrate with renal dysfunction).  11. Mitral valve disease: TEE with mild-moderate MR, mild MS.  12. GERD: - ContinueProtonix 40 mg daily. 13. LLE Cellulitis: Completed 2 rounds or oral antibiotics PTA. Blood cultures obtained 07/02/20. NGTD completed course of IV Vanc, now resolved.  14. DMII: 04/2020 Hgb A1C 9.9. Has had some hypoglycemia this admission.  - TRH managing regimen  15. Anemia , Iron deficient: Followed by Dr Fuller Plan Had planned EGD/Colonoscopy but this was postponed due to respiratory studies. Hgb down to 7.4 this admit . No obvious source of bleeding.  MCV persistently low. Given Feraheme 11/21. Eliquis restarted. Hgb stable today at 9.8.  - Dr. Henrene Pastor has seen, recommend outpatient f/u  16. Debility - continue to mobilize w/ PT - they are recommending [facility] at d/c. Will place order   If she remains stable tomorrow in NSR with creatinine stable or down-trending, will arrange for discharge.   [name] 07/12/2020 10:57 AM

## 2020-07-12 NOTE — Progress Notes (Signed)
PROGRESS NOTE    [name]  JKK:[id number] DOB: 1950/10/16 DOA: 07/02/2020 PCP: [name], DO    Brief Narrative:  [name] is a 69 year old female with past medical history significant for type 2 diabetes mellitus, OSA, left ear s/p TAVR, obesity s/p gastric sleeve, CAD, essential hypertension, hyperlipidemia, stage IV CKD, chronic diastolic CHF, A. fib on Eliquis who was admitted on 07/02/2020 from a CHF clinic for acute on chronic diastolic congestive heart failure and questionable left lower extremity cellulitis.  Hospital service was consulted for possible left lower extremity cellulitis, with failed outpatient treatment with Keflex.   Assessment & Plan:   Principal Problem:   Acute on chronic diastolic heart failure (HCC) Active Problems:   Hyperlipidemia associated with type 2 diabetes mellitus (HCC)   Benign essential HTN   Coronary artery disease, non-occlusive   CKD stage 3 due to type 2 diabetes mellitus (HCC)   Cellulitis of left lower leg   Left lower extremity stasis dermatitis with ulcerations vs cellulitis Etiology likely suspicious for stasis dermatitis versus peripheral vascular disease.  Patient completed 5-day course of IV vancomycin.  Patient is afebrile without leukocytosis.  Left lower extremity vascular duplex arterial ultrasound 11/22 with popliteal artery appears occluded, distal PT and AT appear occluded unable to visualize bypass graft with possible collateral noted at distal femoral artery.  Patient has undergone a previous left lower extremity bypass graft 1 year ago that appears occluded.  Seen by vascular surgery, Dr. Scot Dock on 11/23.  This appears to be a chronic and not an acute occlusion.  Patient follows with vascular surgery at [facility].  Given that they are heavily involved in her care, vascular surgery does not see any acute indication for arteriography or further work-up at this time and recommend follow-up with her  primary vascular surgeon at [facility] following discharge. --continue Vascepa 2g PO BID  Acute on chronic diastolic congestive heart failure, decompensated BNP elevated 116.6 on admission.  TTE 07/07/2020 with LVEF 60 to 65%, no regional wall motion normalities, mild concentric LVH, moderate pericardial effusion without evidence of tamponade, mild mitral stenosis, IVC dilated in size.  TEE 07/08/2020 with LVEF 55%, mild LVH, LA mildly dilated, 1.4 cm calcified nodule posterior leaflet mitral valve, mild/moderate MR, no PFO, normal-appearing bioprosthetic aortic valve. --net negative 822mL past 24h and negative 4.5L since admission --continue Tafamidis 61mg  PO daily; PYP scan 09/2018 suggestive of transthyretin amyloidosis with genetic testing c/w wild type --torsemide 40mg  PO qAM and 20mg  PO qPM --Strict I's and O's and daily weights --Continue to monitor renal function closely daily with electrolytes  Paroxysmal atrial fibrillation w/ RVR Patient was noted to be in rapid ventricular response this morning by cardiology.   --Cardiology transitioning amiodarone drip back to amiodarone 200 mg p.o. twice daily today --Eliquis 5 mg p.o. twice daily  Essential pretension BP 120/53 this morning, well controlled. --Hydralazine 50 mg TID  Hyperlipidemia --On fenofibrate 160 mg p.o. daily, nephrology recommends discontinuation given renal function --continue vascepa 2g PO BID  Type 2 diabetes mellitus Hemoglobin A1c 6.4. Home regimen includes NPH 55 units with breakfast and lunch, 20 units with dinner.  Patient with intermittent hypoglycemic episodes, stemming from prolonged n.p.o. status awaiting TEE in which she received her home regimen of insulin. Now glucose trending up. --Increase NPH to 20u  BID --Insulin sliding scale for further coverage --Continue close monitoring of CBGs every 4 hours  AKI on CKD stage IV Baseline creatinine 2.8-3.0.  Follows with Dr.  Joelyn Oms, nephrology  outpatient. --Nephrology following, appreciate assistance --Cr 3.54>3.49>3.03>3.12>3.37>3.72>3.21>2.75>2.94>3.20>3.08 --torsemide 40mg  PO qAM and 20mg  PO qPM --Avoid nephrotoxins, renally dose all medications --Strict I's notes --Monitor renal function closely daily  Anemia of chronic kidney disease Iron 78, TIBC 468, ferritin 30, folate 11.8, B12 513.  FOBT positive.  Evaluated by [facility], Dr Henrene Pastor on 07/03/2020 with recommendations of increased Protonix to 40 mg twice daily, oral iron therapy and no indication for endoscopic procedures at this time. --s/p IV Feraheme 11/21 --s/p 1u PRBC on 11/23 --s/p aranesp 40 mcg on 11/24 --Hgb 8.2>9.9>9.9>9.5>7.8>9.4>9.3>9.8 --Continue Protonix 40 mg p.o. twice daily --Ferrous sulfate 325 mg p.o. twice daily --Outpatient follow-up with GI, Dr. Fuller Plan  Obesity Body mass index is 29.7 kg/m.  Discussed need for lifestyle changes/weight loss as this complicates all facets of care.  Weakness, deconditioning, debility: --PT recommends home health on dc   DVT prophylaxis: Eliquis Code Status: Full code Family Communication: No family present at bedside this morning  Disposition Plan: Per primary, cardiology, heart failure service   Consultants:   Hospitalist  Nephrology  Vascular surgery  Procedures:   Right heart catheterization 11/22  Vascular ultrasound duplex lower extremity arterial 11/22  TTE 11/24  TEE 11/24:  Antimicrobials:   Vancomycin 11/18 - 11/23   Subjective: Patient seen and examined bedside, resting comfortably.  Eating breakfast sitting at edge of bed.  Creatinine improved today, transition to oral diuretics today.  Nephrology now signed off.  No specific complaints other than she would like to discharge home. Denies headache, no visual changes, no chest pain, no palpitations, no shortness of breath, no abdominal pain.  No other concerns other than hyperglycemia per nursing staff this  morning.  Objective: Vitals:   07/11/20 1954 07/12/20 0649 07/12/20 0959 07/12/20 1001  BP: (!) 132/42 (!) 130/52 (!) 144/44 (!) 144/44  Pulse: 70 76    Resp: 16 19    Temp: 98.9 F (37.2 C) 97.9 F (36.6 C)    TempSrc: Oral Oral    SpO2: 90% 92%    Weight:  83.5 kg    Height:        Intake/Output Summary (Last 24 hours) at 07/12/2020 1347 Last data filed at 07/12/2020 1312 Gross per 24 hour  Intake 513 ml  Output 1851 ml  Net -1338 ml   Filed Weights   07/08/20 1300 07/10/20 0458 07/12/20 0649  Weight: 88.9 kg 86.2 kg 83.5 kg    Examination:  General exam: Appears calm and comfortable  Respiratory system: Clear to auscultation. Respiratory effort normal.  Oxygenating well on room air Cardiovascular system: S1 & S2 heard, irregularly irregular rhythm, normal rate. No JVD, murmurs, rubs, gallops or clicks. No pedal edema. Gastrointestinal system: Abdomen is nondistended, soft and nontender. No organomegaly or masses felt. Normal bowel sounds heard. Central nervous system: Alert and oriented. No focal neurological deficits. Extremities: Symmetric 5 x 5 power, noted left TMA Skin: No rashes, lesions or ulcers Psychiatry: Judgement and insight appear normal. Mood & affect appropriate.     Data Reviewed: I have personally reviewed following labs and imaging studies  CBC: Recent Labs  Lab 07/08/20 1540 07/09/20 0022 07/10/20 0223 07/11/20 0356 07/12/20 0202  WBC 9.4 8.6 6.4 6.4 6.5  HGB 9.5* 9.4* 9.2* 9.3* 9.8*  HCT 32.5* 32.0* 31.2* 32.1* 33.7*  MCV 77.0* 77.7* 78.2* 78.3* 78.9*  PLT 389 354 301 267 119   Basic Metabolic Panel: Recent Labs  Lab 07/08/20 0912 07/09/20 0022 07/10/20 0223 07/11/20 0356  07/12/20 0202  NA 139 137 137 135 139  K 3.7 3.9 3.8 3.3* 3.4*  CL 100 96* 92* 90* 92*  CO2 28 29 30  32 34*  GLUCOSE 97 82 238* 160* 149*  BUN 58* 52* 58* 60* 62*  CREATININE 3.21* 2.75* 2.94* 3.20* 3.08*  CALCIUM 8.6* 8.5* 8.5* 8.3* 8.7*  MG  --  2.3  2.3  --   --    GFR: Estimated Creatinine Clearance: 18.8 mL/min (A) (by C-G formula based on SCr of 3.08 mg/dL (H)). Liver Function Tests: No results for input(s): AST, ALT, ALKPHOS, BILITOT, PROT, ALBUMIN in the last 168 hours. No results for input(s): LIPASE, AMYLASE in the last 168 hours. No results for input(s): AMMONIA in the last 168 hours. Coagulation Profile: No results for input(s): INR, PROTIME in the last 168 hours. Cardiac Enzymes: No results for input(s): CKTOTAL, CKMB, CKMBINDEX, TROPONINI in the last 168 hours. BNP (last 3 results) No results for input(s): PROBNP in the last 8760 hours. HbA1C: No results for input(s): HGBA1C in the last 72 hours. CBG: Recent Labs  Lab 07/11/20 2047 07/11/20 2332 07/12/20 0537 07/12/20 0745 07/12/20 1125  GLUCAP 267* 194* 97 87 161*   Lipid Profile: No results for input(s): CHOL, HDL, LDLCALC, TRIG, CHOLHDL, LDLDIRECT in the last 72 hours. Thyroid Function Tests: No results for input(s): TSH, T4TOTAL, FREET4, T3FREE, THYROIDAB in the last 72 hours. Anemia Panel: No results for input(s): VITAMINB12, FOLATE, FERRITIN, TIBC, IRON, RETICCTPCT in the last 72 hours. Sepsis Labs: No results for input(s): PROCALCITON, LATICACIDVEN in the last 168 hours.  Recent Results (from the past 240 hour(s))  Culture, blood (routine x 2)     Status: None   Collection Time: 07/02/20  4:28 PM   Specimen: BLOOD  Result Value Ref Range Status   Specimen Description BLOOD RIGHT ANTECUBITAL  Final   Special Requests   Final    BOTTLES DRAWN AEROBIC AND ANAEROBIC Blood Culture adequate volume   Culture   Final    NO GROWTH 5 DAYS Performed at [facility], [address] [place], [place] [address]    Report Status 07/07/2020 FINAL  Final  Culture, blood (routine x 2)     Status: None   Collection Time: 07/02/20  4:31 PM   Specimen: BLOOD  Result Value Ref Range Status   Specimen Description BLOOD LEFT ANTECUBITAL  Final   Special  Requests   Final    BOTTLES DRAWN AEROBIC AND ANAEROBIC Blood Culture adequate volume   Culture   Final    NO GROWTH 5 DAYS Performed at [facility], [place] [address] [place],  [address]    Report Status 07/07/2020 FINAL  Final  Respiratory Panel by RT PCR (Flu A&B, Covid) - Nasopharyngeal Swab     Status: None   Collection Time: 07/02/20  4:37 PM   Specimen: Nasopharyngeal Swab; Nasopharyngeal(NP) swabs in vial transport medium  Result Value Ref Range Status   SARS Coronavirus 2 by RT PCR NEGATIVE NEGATIVE Final    Comment: (NOTE) SARS-CoV-2 target nucleic acids are NOT DETECTED.  The SARS-CoV-2 RNA is generally detectable in upper respiratoy specimens during the acute phase of infection. The lowest concentration of SARS-CoV-2 viral copies this assay can detect is 131 copies/mL. A negative result does not preclude SARS-Cov-2 infection and should not be used as the sole basis for treatment or other patient management decisions. A negative result may occur with  improper specimen collection/handling, submission of specimen  other than nasopharyngeal swab, presence of viral mutation(s) within the areas targeted by this assay, and inadequate number of viral copies (<131 copies/mL). A negative result must be combined with clinical observations, patient history, and epidemiological information. The expected result is Negative.  Fact Sheet for Patients:  PinkCheek.be  Fact Sheet for Healthcare Providers:  GravelBags.it  This test is no t yet approved or cleared by the [place] FDA and  has been authorized for detection and/or diagnosis of SARS-CoV-2 by FDA under an Emergency Use Authorization (EUA). This EUA will remain  in effect (meaning this test can be used) for the duration of the COVID-19 declaration under Section 564(b)(1) of the Act, 21 U.S.C. section 360bbb-3(b)(1), unless the authorization is  terminated or revoked sooner.     Influenza A by PCR NEGATIVE NEGATIVE Final   Influenza B by PCR NEGATIVE NEGATIVE Final    Comment: (NOTE) The Xpert Xpress SARS-CoV-2/FLU/RSV assay is intended as an aid in  the diagnosis of influenza from Nasopharyngeal swab specimens and  should not be used as a sole basis for treatment. Nasal washings and  aspirates are unacceptable for Xpert Xpress SARS-CoV-2/FLU/RSV  testing.  Fact Sheet for Patients: PinkCheek.be  Fact Sheet for Healthcare Providers: GravelBags.it  This test is not yet approved or cleared by the [place] FDA and  has been authorized for detection and/or diagnosis of SARS-CoV-2 by  FDA under an Emergency Use Authorization (EUA). This EUA will remain  in effect (meaning this test can be used) for the duration of the  Covid-19 declaration under Section 564(b)(1) of the Act, 21  U.S.C. section 360bbb-3(b)(1), unless the authorization is  terminated or revoked. Performed at [facility], [place] [address] [place], [place] [address]          Radiology Studies: No results found.      Scheduled Meds: . sodium chloride   Intravenous Once  . amiodarone  200 mg Oral BID  . apixaban  5 mg Oral BID  . docusate sodium  100 mg Oral BID  . ferrous sulfate  325 mg Oral BID WC  . hydrALAZINE  50 mg Oral TID  . icosapent Ethyl  2 g Oral BID  . insulin aspart  0-9 Units Subcutaneous TID WC  . insulin NPH Human  15 Units Subcutaneous BID AC & HS  . pantoprazole  40 mg Oral BID  . sodium chloride flush  3 mL Intravenous Q12H  . Tafamidis  61 mg Oral Daily  . torsemide  20 mg Oral QPM  . torsemide  40 mg Oral Q breakfast   Continuous Infusions:    LOS: 10 days    Time spent: 36 minutes spent on chart review, discussion with nursing staff, consultants, updating family and interview/physical exam; more than 50% of that time was spent in counseling and/or  coordination of care.    Dorma Altman J [place] DO [facility] Available via Epic secure chat 7am-7pm After these hours, please refer to coverage provider listed on [email or web address] 07/12/2020, 1:47 PM

## 2020-07-12 NOTE — Progress Notes (Signed)
[facility] Progress Note  Name: [name] MRN: [id number] DOB: [birth year]  Chief Complaint:  Shortness of breath, leg swelling, fluid overload  Subjective:  Patient feels well today.  Creatinine stable to slightly improved.  No complaints  Review of systems:  12 systems reviewed and negative except per subjective  ------    Intake/Output Summary (Last 24 hours) at 07/12/2020 1104 Last data filed at 07/12/2020 0940 Gross per 24 hour  Intake 510 ml  Output 1851 ml  Net -1341 ml    Vitals:  Vitals:   07/11/20 1954 07/12/20 0649 07/12/20 0959 07/12/20 1001  BP: (!) 132/42 (!) 130/52 (!) 144/44 (!) 144/44  Pulse: 70 76    Resp: 16 19    Temp: 98.9 F (37.2 C) 97.9 F (36.6 C)    TempSrc: Oral Oral    SpO2: 90% 92%    Weight:  83.5 kg    Height:         Physical Exam:   General: Adult female in bed obese habitus no acute distress at rest HEENT: Normocephalic atraumatic Eyes: Extraocular movements intact sclera anicteric Neck: Increased neck circumference Heart: S1-S2 no rub appreciated Lungs:  Clear to auscultation bilaterally, bilateral chest rise, no increased work of breathing Abdomen: Softly distended/obese/nontender Extremities: Trace edema bilateral lower extremities  Neuro: Alert and oriented x3 provides a history and follows commands Psych normal mood and affect  Medications reviewed   Labs:  BMP Latest Ref Rng & Units 07/12/2020 07/11/2020 07/10/2020  Glucose 70 - 99 mg/dL 149(H) 160(H) 238(H)  BUN 8 - 23 mg/dL 62(H) 60(H) 58(H)  Creatinine 0.44 - 1.00 mg/dL 3.08(H) 3.20(H) 2.94(H)  BUN/Creat Ratio 12 - 28 - - -  Sodium 135 - 145 mmol/L 139 135 137  Potassium 3.5 - 5.1 mmol/L 3.4(L) 3.3(L) 3.8  Chloride 98 - 111 mmol/L 92(L) 90(L) 92(L)  CO2 22 - 32 mmol/L 34(H) 32 30  Calcium 8.9 - 10.3 mg/dL 8.7(L) 8.3(L) 8.5(L)     Assessment/Plan:   # AKI on CKD 4: Baseline creatinine 2.8-3.2 multifactorial.  AKI likely related to  cardiorenal syndrome.  Creatinine fluctuating which is common with diuresis but now relatively within baseline - Creatinine stable after switching diuretics -Further diuretic titration deferred to cardiology - Would hold on arb at DC and restart outpt when stable - Would defer fenofibrate given her advanced CKD   # CKD stage IV  - Secondary to DM, HTN and CHF  - Baseline Cr 2.8 - 3.2 - Follows with Dr. Joelyn Oms at [facility]   # Acute on chronic diastolic CHF - Diuretics as above  # Hypertension - acceptable control   # Anemia of CKD - received IV iron earlier this hospitalization.  S/p PRBC on 11/23.  S/p aranesp 40 mcg once on 11/24. On oral iron    # Left leg dermatitis - Status post vancomycin and previously on Bactrim as an outpatient. Likely stasis dermatitis - Would avoid Bactrim in advanced CKD and note that Vanc has been stopped  #Afib: on amiodarone infusion per cardiology. On eliquis.  Given the patient's stability of her creatinine and more optimize volume status we will sign off at this time.  Reesa Chew, MD 07/12/2020 11:04 AM

## 2020-07-13 ENCOUNTER — Other Ambulatory Visit: Payer: Self-pay | Admitting: Cardiology

## 2020-07-13 DIAGNOSIS — I5033 Acute on chronic diastolic (congestive) heart failure: Secondary | ICD-10-CM | POA: Diagnosis not present

## 2020-07-13 LAB — CBC
HCT: 34.7 % — ABNORMAL LOW (ref 36.0–46.0)
Hemoglobin: 10.3 g/dL — ABNORMAL LOW (ref 12.0–15.0)
MCH: 23.5 pg — ABNORMAL LOW (ref 26.0–34.0)
MCHC: 29.7 g/dL — ABNORMAL LOW (ref 30.0–36.0)
MCV: 79.2 fL — ABNORMAL LOW (ref 80.0–100.0)
Platelets: 249 10*3/uL (ref 150–400)
RBC: 4.38 MIL/uL (ref 3.87–5.11)
RDW: 27.9 % — ABNORMAL HIGH (ref 11.5–15.5)
WBC: 6.8 10*3/uL (ref 4.0–10.5)
nRBC: 0 % (ref 0.0–0.2)

## 2020-07-13 LAB — BASIC METABOLIC PANEL
Anion gap: 16 — ABNORMAL HIGH (ref 5–15)
BUN: 61 mg/dL — ABNORMAL HIGH (ref 8–23)
CO2: 31 mmol/L (ref 22–32)
Calcium: 8.6 mg/dL — ABNORMAL LOW (ref 8.9–10.3)
Chloride: 92 mmol/L — ABNORMAL LOW (ref 98–111)
Creatinine, Ser: 3.24 mg/dL — ABNORMAL HIGH (ref 0.44–1.00)
GFR, Estimated: 15 mL/min — ABNORMAL LOW (ref >60)
Glucose, Bld: 117 mg/dL — ABNORMAL HIGH (ref 70–99)
Potassium: 3.6 mmol/L (ref 3.5–5.1)
Sodium: 139 mmol/L (ref 135–145)

## 2020-07-13 LAB — GLUCOSE, CAPILLARY
Glucose-Capillary: 122 mg/dL — ABNORMAL HIGH (ref 70–99)
Glucose-Capillary: 144 mg/dL — ABNORMAL HIGH (ref 70–99)
Glucose-Capillary: 305 mg/dL — ABNORMAL HIGH (ref 70–99)
Glucose-Capillary: 331 mg/dL — ABNORMAL HIGH (ref 70–99)

## 2020-07-13 MED ORDER — INSULIN ASPART 100 UNIT/ML ~~LOC~~ SOLN
8.0000 [IU] | Freq: Once | SUBCUTANEOUS | Status: AC
  Administered 2020-07-13: 8 [IU] via SUBCUTANEOUS

## 2020-07-13 MED ORDER — INSULIN NPH (HUMAN) (ISOPHANE) 100 UNIT/ML ~~LOC~~ SUSP
25.0000 [IU] | Freq: Two times a day (BID) | SUBCUTANEOUS | Status: DC
  Administered 2020-07-13: 25 [IU] via SUBCUTANEOUS

## 2020-07-13 MED ORDER — INSULIN NPH (HUMAN) (ISOPHANE) 100 UNIT/ML ~~LOC~~ SUSP: 25.0000 [IU] | Freq: Two times a day (BID) | SUBCUTANEOUS | 11 refills | Status: AC

## 2020-07-13 MED ORDER — AMIODARONE HCL 200 MG PO TABS: ORAL_TABLET | ORAL | 3 refills | Status: DC

## 2020-07-13 MED ORDER — TORSEMIDE 20 MG PO TABS: ORAL_TABLET | ORAL | 5 refills | Status: DC

## 2020-07-13 NOTE — TOC Transition Note (Addendum)
Transition of Care (TOC) - CM/SW Discharge Note Marvetta Gibbons RN, BSN Transitions of Care Unit 4E- RN Case Manager See Treatment Team for direct phone # Cross Coverage for [place]   Patient Details  Name: [name] MRN: [id number] Date of Birth: [birth year]  Transition of Care [facility]) CM/SW Contact:  Dawayne Patricia, RN Phone Number: 07/13/2020, 12:35 PM   Clinical Narrative:    Pt stable for transition home today, HHPT order placed for resumption of [place] services with [name], Call made to [facility] with [facility] for notification of resumption of [place] needs.  1400- notified by bedside RN- that pt has wound care need- will have MD add RN to [facility] orders- have updated [place].    Final next level of care: [place] Barriers to Discharge: No Barriers Identified   Patient Goals and CMS Choice    Resumption    Discharge Placement                 Home with [facility]      Discharge Plan and Services   Discharge Planning Services: CM Consult Post Acute Care Choice: Home Health, Resumption of Svcs/PTA Provider          DME Arranged: N/A DME Agency: NA       HH Arranged: PT HH Agency: [place] Date [place]: 07/13/20 Time [place]: 1234 Representative spoke with at [place]: [place] ([place]) Interventions     Readmission Risk Interventions Readmission Risk Prevention Plan 07/13/2020 04/25/2019 05/10/2018  Transportation Screening Complete Complete Complete  PCP or Specialist Appt within 5-7 Days - - Complete  PCP or Specialist Appt within 3-5 Days - Patient refused -  Home Care Screening - - Complete  Medication Review (RN CM) - - Complete  [place] or [place] - Complete -  Social Work Consult for [place] Planning/Counseling - Complete -  Palliative Care Screening - Not Applicable -  Medication Review Press photographer) Complete Complete -  PCP or Specialist appointment within 3-5 days  of discharge Complete - -  [place] or Home Care Consult Complete - -  SW Recovery Care/Counseling Consult Complete - -  Palliative Care Screening Not Applicable - -  [place] Not Applicable - -  Some recent data might be hidden

## 2020-07-13 NOTE — Discharge Instructions (Signed)
Wound Care - Apply Aquacel Advantage to ulcers on left leg. Secure with Kerlix. Change daily or as needed for soiling.   Heart Failure, Diagnosis  Heart failure means that your heart is not able to pump blood in the right way. This makes it hard for your body to work well. Heart failure is usually a long-term (chronic) condition. You must take good care of yourself and follow your treatment plan from your doctor. What are the causes? This condition may be caused by:  High blood pressure.  Build up of cholesterol and fat in the arteries.  Heart attack. This injures the heart muscle.  Heart valves that do not open and close properly.  Damage of the heart muscle. This is also called cardiomyopathy.  Lung disease.  Abnormal heart rhythms. What increases the risk? The risk of heart failure goes up as a person ages. This condition is also more likely to develop in people who:  Are overweight.  Are female.  Smoke or chew tobacco.  Abuse alcohol or illegal drugs.  Have taken medicines that can damage the heart.  Have diabetes.  Have abnormal heart rhythms.  Have thyroid problems.  Have low blood counts (anemia). What are the signs or symptoms? Symptoms of this condition include:  Shortness of breath.  Coughing.  Swelling of the feet, ankles, legs, or belly.  Losing weight for no reason.  Trouble breathing.  Waking from sleep because of the need to sit up and get more air.  Rapid heartbeat.  Being very tired.  Feeling dizzy, or feeling like you may pass out (faint).  Having no desire to eat.  Feeling like you may vomit (nauseous).  Peeing (urinating) more at night.  Feeling confused. How is this treated?     This condition may be treated with:  Medicines. These can be given to treat blood pressure and to make the heart muscles stronger.  Changes in your daily life. These may include eating a healthy diet, staying at a healthy body weight, quitting  tobacco and illegal drug use, or doing exercises.  Surgery. Surgery can be done to open blocked valves, or to put devices in the heart, such as pacemakers.  A donor heart (heart transplant). You will receive a healthy heart from a donor. Follow these instructions at home:  Treat other conditions as told by your doctor. These may include high blood pressure, diabetes, thyroid disease, or abnormal heart rhythms.  Learn as much as you can about heart failure.  Get support as you need it.  Keep all follow-up visits as told by your doctor. This is important. Summary  Heart failure means that your heart is not able to pump blood in the right way.  This condition is caused by high blood pressure, heart attack, or damage of the heart muscle.  Symptoms of this condition include shortness of breath and swelling of the feet, ankles, legs, or belly. You may also feel very tired or feel like you may vomit.  You may be treated with medicines, surgery, or changes in your daily life.  Treat other health conditions as told by your doctor. This information is not intended to replace advice given to you by your health care provider. Make sure you discuss any questions you have with your health care provider. Document Revised: 10/19/2018 Document Reviewed: 10/19/2018 Elsevier Patient Education  [place].

## 2020-07-13 NOTE — Progress Notes (Signed)
PROGRESS NOTE    [name]  WEX:[id number] DOB: July 28, 1951 DOA: 07/02/2020 PCP: [name], DO    Brief Narrative:  [name] is a 69 year old female with past medical history significant for type 2 diabetes mellitus, OSA, left ear s/p TAVR, obesity s/p gastric sleeve, CAD, essential hypertension, hyperlipidemia, stage IV CKD, chronic diastolic CHF, A. fib on Eliquis who was admitted on 07/02/2020 from a CHF clinic for acute on chronic diastolic congestive heart failure and questionable left lower extremity cellulitis.  Hospital service was consulted for possible left lower extremity cellulitis, with failed outpatient treatment with Keflex.   Assessment & Plan:   Principal Problem:   Acute on chronic diastolic heart failure (HCC) Active Problems:   Hyperlipidemia associated with type 2 diabetes mellitus (HCC)   Benign essential HTN   Coronary artery disease, non-occlusive   CKD stage 3 due to type 2 diabetes mellitus (HCC)   Cellulitis of left lower leg   Left lower extremity stasis dermatitis with ulcerations vs cellulitis Etiology likely suspicious for stasis dermatitis versus peripheral vascular disease.  Patient completed 5-day course of IV vancomycin.  Patient is afebrile without leukocytosis.  Left lower extremity vascular duplex arterial ultrasound 11/22 with popliteal artery appears occluded, distal PT and AT appear occluded unable to visualize bypass graft with possible collateral noted at distal femoral artery.  Patient has undergone a previous left lower extremity bypass graft 1 year ago that appears occluded.  Seen by vascular surgery, Dr. Scot Dock on 11/23.  This appears to be a chronic and not an acute occlusion.  Patient follows with vascular surgery at [facility].  Given that they are heavily involved in her care, vascular surgery does not see any acute indication for arteriography or further work-up at this time and recommend follow-up with her  primary vascular surgeon at [facility] following discharge. --continue Vascepa 2g PO BID  Acute on chronic diastolic congestive heart failure, decompensated BNP elevated 116.6 on admission.  TTE 07/07/2020 with LVEF 60 to 65%, no regional wall motion normalities, mild concentric LVH, moderate pericardial effusion without evidence of tamponade, mild mitral stenosis, IVC dilated in size.  TEE 07/08/2020 with LVEF 55%, mild LVH, LA mildly dilated, 1.4 cm calcified nodule posterior leaflet mitral valve, mild/moderate MR, no PFO, normal-appearing bioprosthetic aortic valve. 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   --Cardiology transitioning amiodarone drip back to amiodarone 200 mg p.o. twice daily x 1 week then 200 mg daily --Eliquis 5 mg p.o. twice daily  Essential pretension BP 120/53 this morning, well controlled. --Hydralazine 50 mg TID  Hyperlipidemia --On fenofibrate 160 mg p.o. daily, nephrology recommends discontinuation given renal function --continue vascepa 2g PO BID  Type 2 diabetes mellitus Hemoglobin A1c 6.4. Home regimen includes NPH 55 units with breakfast and lunch, 20 units with dinner.  Patient with intermittent hypoglycemic episodes, stemming from prolonged n.p.o. status awaiting TEE in which she received her home regimen of insulin. Now glucose trending up. --Increase NPH to 25u Calvin BID; can resume home regimen on discharge as patient understands and titrates her insulin based on her glucose readings at home --Insulin  sliding scale for further coverage --Continue  close monitoring of CBGs every 4 hours  AKI on CKD stage IV Baseline creatinine 2.8-3.0.  Follows with Dr. Joelyn Oms, nephrology outpatient. --Nephrology following, appreciate assistance --Cr 3.54>3.49>3.03>3.12>3.37>3.72>3.21>2.75>2.94>3.20>3.08>3.24 --torsemide 40mg  PO qAM and 20mg  PO qPM --Avoid nephrotoxins, renally dose all medications --Strict I's notes --Monitor renal function closely daily  Anemia of chronic kidney disease Iron 78, TIBC 468, ferritin 30, folate 11.8, B12 513.  FOBT positive.  Evaluated by  GI, Dr Henrene Pastor on 07/03/2020 with recommendations of increased Protonix to 40 mg twice daily, oral iron therapy and no indication for endoscopic procedures at this time. --s/p IV Feraheme 11/21 --s/p 1u PRBC on 11/23 --s/p aranesp 40 mcg on 11/24 --Hgb 8.2>9.9>9.9>9.5>7.8>9.4>9.3>9.8>10.3 --Continue Protonix 40 mg p.o. twice daily --Ferrous sulfate 325 mg p.o. twice daily --Outpatient follow-up with GI, Dr. Fuller Plan  Obesity Body mass index is 30.6 kg/m.  Discussed need for lifestyle changes/weight loss as this complicates all facets of care.  Weakness, deconditioning, debility: --PT recommends home health on dc   DVT prophylaxis: Eliquis Code Status: Full code Family Communication: No family present at bedside this morning  Disposition Plan: Discharging home per cardiology today   Consultants:   Hospitalist  Nephrology  Vascular surgery  Procedures:   Right heart catheterization 11/22  Vascular ultrasound duplex lower extremity arterial 11/22  TTE 11/24  TEE 11/24:  Antimicrobials:   Vancomycin 11/18 - 11/23   Subjective: Patient seen and examined bedside, resting comfortably.  Eating breakfast sitting at edge of bed.  No specific complaints.  Cardiology discharging home today.  Denies headache, no visual changes, no chest pain, no palpitations, no shortness of breath, no abdominal pain.  No other concerns other than hyperglycemia per nursing  staff this morning.  Objective: Vitals:   07/12/20 2006 07/13/20 0047 07/13/20 0417 07/13/20 0800  BP: (!) 166/59 138/74 (!) 121/47 (!) 115/52  Pulse: 75 73 79   Resp: 19 17 18 18   Temp: 97.7 F (36.5 C) 97.9 F (36.6 C) 97.7 F (36.5 C) 97.9 F (36.6 C)  TempSrc: Oral Oral Oral Oral  SpO2: 94% 94%    Weight:   86 kg   Height:        Intake/Output Summary (Last 24 hours) at 07/13/2020 1233 Last data filed at 07/13/2020 0900 Gross per 24 hour  Intake 3 ml  Output 2075 ml  Net -2072 ml   Filed Weights   07/10/20 0458 07/12/20 0649 07/13/20 0417  Weight: 86.2 kg 83.5 kg 86 kg    Examination:  General exam: Appears calm and comfortable  Respiratory system: Clear to auscultation. Respiratory effort normal.  Oxygenating well on room air Cardiovascular system: S1 & S2 heard, irregularly irregular rhythm, normal rate. No JVD, murmurs, rubs, gallops or clicks. No pedal edema. Gastrointestinal system: Abdomen is nondistended, soft and nontender. No organomegaly or masses felt. Normal bowel sounds heard. Central nervous system: Alert and oriented. No focal neurological deficits. Extremities: Symmetric 5 x 5 power, noted left TMA Skin: No rashes, lesions or ulcers Psychiatry: Judgement and insight appear normal. Mood & affect appropriate.     Data Reviewed: I have personally reviewed following labs and imaging studies  CBC: Recent Labs  Lab 07/09/20 0022 07/10/20 0223 07/11/20 0356 07/12/20 0202 07/13/20 0723  WBC 8.6 6.4 6.4 6.5 6.8  HGB 9.4* 9.2* 9.3* 9.8* 10.3*  HCT 32.0* 31.2* 32.1* 33.7* 34.7*  MCV 77.7* 78.2* 78.3* 78.9* 79.2*  PLT 354 301 267 251 249   Basic  Metabolic Panel: Recent Labs  Lab 07/09/20 0022 07/10/20 0223 07/11/20 0356 07/12/20 0202 07/13/20 0723  NA 137 137 135 139 139  K 3.9 3.8 3.3* 3.4* 3.6  CL 96* 92* 90* 92* 92*  CO2 29 30 32 34* 31  GLUCOSE 82 238* 160* 149* 117*  BUN 52* 58* 60* 62* 61*  CREATININE 2.75* 2.94* 3.20* 3.08*  3.24*  CALCIUM 8.5* 8.5* 8.3* 8.7* 8.6*  MG 2.3 2.3  --   --   --    GFR: Estimated Creatinine Clearance: 18.1 mL/min (A) (by C-G formula based on SCr of 3.24 mg/dL (H)). Liver Function Tests: No results for input(s): AST, ALT, ALKPHOS, BILITOT, PROT, ALBUMIN in the last 168 hours. No results for input(s): LIPASE, AMYLASE in the last 168 hours. No results for input(s): AMMONIA in the last 168 hours. Coagulation Profile: No results for input(s): INR, PROTIME in the last 168 hours. Cardiac Enzymes: No results for input(s): CKTOTAL, CKMB, CKMBINDEX, TROPONINI in the last 168 hours. BNP (last 3 results) No results for input(s): PROBNP in the last 8760 hours. HbA1C: No results for input(s): HGBA1C in the last 72 hours. CBG: Recent Labs  Lab 07/12/20 1618 07/12/20 2004 07/13/20 0009 07/13/20 0420 07/13/20 0757  GLUCAP 289* 293* 305* 144* 122*   Lipid Profile: No results for input(s): CHOL, HDL, LDLCALC, TRIG, CHOLHDL, LDLDIRECT in the last 72 hours. Thyroid Function Tests: No results for input(s): TSH, T4TOTAL, FREET4, T3FREE, THYROIDAB in the last 72 hours. Anemia Panel: No results for input(s): VITAMINB12, FOLATE, FERRITIN, TIBC, IRON, RETICCTPCT in the last 72 hours. Sepsis Labs: No results for input(s): PROCALCITON, LATICACIDVEN in the last 168 hours.  No results found for this or any previous visit (from the past 240 hour(s)).       Radiology Studies: No results found.      Scheduled Meds: . sodium chloride   Intravenous Once  . amiodarone  200 mg Oral BID  . apixaban  5 mg Oral BID  . docusate sodium  100 mg Oral BID  . ferrous sulfate  325 mg Oral BID WC  . hydrALAZINE  50 mg Oral TID  . icosapent Ethyl  2 g Oral BID  . insulin aspart  0-9 Units Subcutaneous TID WC  . insulin NPH Human  25 Units Subcutaneous BID AC & HS  . pantoprazole  40 mg Oral BID  . sodium chloride flush  3 mL Intravenous Q12H  . Tafamidis  61 mg Oral Daily  . torsemide  20 mg Oral  QPM  . torsemide  40 mg Oral Q breakfast   Continuous Infusions:    LOS: 11 days    Time spent: 26 minutes spent on chart review, discussion with nursing staff, consultants, updating family and interview/physical exam; more than 50% of that time was spent in counseling and/or coordination of care.    Marites Nath J [place] DO [facility] Available via Epic secure chat 7am-7pm After these hours, please refer to coverage provider listed on [email or web address] 07/13/2020, 12:33 PM

## 2020-07-13 NOTE — Progress Notes (Signed)
PT Cancellation Note  Patient Details Name: [name] MRN: [id number] DOB: [birth year]   Cancelled Treatment:    Reason Eval/Treat Not Completed: Patient declined, no reason specified Patient refused therapy this date due to discharging this afternoon. Educated patient about safe home negotiation and need for assistance. Patient and husband verbalized understanding. PT will re-attempt if patient does not discharge.   Perrin Maltese, PT, DPT Acute Rehabilitation Services Pager [phone] Office [phone]    Alda Lea 07/13/2020, 12:42 PM

## 2020-07-13 NOTE — Progress Notes (Addendum)
Patient ID: [name], female   DOB: 03/16/1951, 69 y.o.   MRN: [id number]     Advanced Heart Failure Rounding Note  PCP-Cardiologist: Minus Breeding, MD  [facility]: Dr. Aundra Dubin   Subjective:    Went into afib w/ RVR 11/25. Started on amio gtt, now on po amiodarone. In NSR on telemetry. HR 70s.   Hgb stable and trending up, 10.3 today.  On PO torsemide. Wt up slight from yesterday but still well below admit wt.  SCr 3.24 today (baseline 2.8>>3.2).   Sitting up on side of bed. Denies dyspnea. Feels well. Wants to go home.     RIGHT HEART CATH 07/06/20    Right Heart Pressures RHC Procedural Findings: Hemodynamics (mmHg) RA mean 17 RV 53/18 PA 56/25, mean 36 PCWP mean 27  Oxygen saturations: PA 48% AO 96%  Cardiac Output (Fick) 6.43  Cardiac Index (Fick) 3.22  PVR 1.4 WU   Conclusion  1. Elevated right and left heart filling pressures.  2. Pulmonary venous hypertension.    2D Echo 07/07/20 1. Left ventricular ejection fraction, by estimation, is 60 to 65%. The left ventricle has normal function. The left ventricle has no regional wall motion abnormalities. There is mild concentric left ventricular hypertrophy. Diastolic function could not be assesed due to absence of atrial contraction (junctional rhythm?). 2. Right ventricular systolic function is normal. The right ventricular size is normal. There is severely elevated pulmonary artery systolic pressure. The estimated right ventricular systolic pressure is 35.7 mmHg. 3. Left atrial size was mildly dilated. 4. Moderate pericardial effusion. The pericardial effusion is circumferential. There is no evidence of cardiac tamponade. 5. The mitral valve is degenerative. Mild mitral valve regurgitation. Mild mitral stenosis. The mean mitral valve gradient is 5.0 mmHg. Moderate mitral annular calcification. 6. Gradients across the TAVR prosthesis have increased and dimensionless index has worsened compared to 12/10/2019.  The aortic valve has been repaired/replaced. Aortic valve regurgitation is not visualized. Aortic valve mean gradient measures 23.0 mmHg. Aortic valve Vmax measures 3.03 m/s. 7. The inferior vena cava is dilated in size with <50% respiratory variability, suggesting right atrial pressure of 15 mmHg.  Objective:   Weight Range: 86 kg Body mass index is 30.6 kg/m.   Vital Signs:   Temp:  [97.7 F (36.5 C)-98 F (36.7 C)] 97.9 F (36.6 C) (11/29 0800) Pulse Rate:  [73-79] 79 (11/29 0417) Resp:  [17-19] 18 (11/29 0800) BP: (115-166)/(44-74) 115/52 (11/29 0800) SpO2:  [91 %-94 %] 94 % (11/29 0047) Weight:  [86 kg] 86 kg (11/29 0417) Last BM Date: 07/11/20  Weight change: Filed Weights   07/10/20 0458 07/12/20 0649 07/13/20 0417  Weight: 86.2 kg 83.5 kg 86 kg    Intake/Output:   Intake/Output Summary (Last 24 hours) at 07/13/2020 0919 Last data filed at 07/13/2020 0047 Gross per 24 hour  Intake 3 ml  Output 2076 ml  Net -2073 ml      Physical Exam    PHYSICAL EXAM: General:  Well appearing, obese WF sitting up on side of bed. No respiratory difficulty HEENT: normal Neck: supple. Thick neck, JVD not well visualized. Carotids 2+ bilat; no bruits. No lymphadenopathy or thyromegaly appreciated. Cor: PMI nondisplaced. Regular rate & rhythm. No rubs, gallops or murmurs. Lungs: clear Abdomen: soft, nontender, nondistended. No hepatosplenomegaly. No bruits or masses. Good bowel sounds. Extremities: no cyanosis, clubbing, rash, minimal/ trace bilateral LE edema s/p left metatarsal amputation  Neuro: alert & oriented x 3, cranial nerves grossly intact. moves all  4 extremities w/o difficulty. Affect pleasant.    Telemetry   NSR 70s. Personally reviewed  Labs    CBC Recent Labs    07/12/20 0202 07/13/20 0723  WBC 6.5 6.8  HGB 9.8* 10.3*  HCT 33.7* 34.7*  MCV 78.9* 79.2*  PLT 251 756   Basic Metabolic Panel Recent Labs    07/12/20 0202 07/13/20 0723  NA 139  139  K 3.4* 3.6  CL 92* 92*  CO2 34* 31  GLUCOSE 149* 117*  BUN 62* 61*  CREATININE 3.08* 3.24*  CALCIUM 8.7* 8.6*   Liver Function Tests No results for input(s): AST, ALT, ALKPHOS, BILITOT, PROT, ALBUMIN in the last 72 hours. No results for input(s): LIPASE, AMYLASE in the last 72 hours. Cardiac Enzymes No results for input(s): CKTOTAL, CKMB, CKMBINDEX, TROPONINI in the last 72 hours.  BNP: BNP (last 3 results) Recent Labs    02/21/20 1151 06/03/20 1354 07/02/20 1628  BNP 538.2* 718.7* 1,102.7*    ProBNP (last 3 results) No results for input(s): PROBNP in the last 8760 hours.   D-Dimer No results for input(s): DDIMER in the last 72 hours. Hemoglobin A1C No results for input(s): HGBA1C in the last 72 hours. Fasting Lipid Panel No results for input(s): CHOL, HDL, LDLCALC, TRIG, CHOLHDL, LDLDIRECT in the last 72 hours. Thyroid Function Tests No results for input(s): TSH, T4TOTAL, T3FREE, THYROIDAB in the last 72 hours.  Invalid input(s): FREET3  Other results:   Imaging    No results found.   Medications:     Scheduled Medications: . sodium chloride   Intravenous Once  . amiodarone  200 mg Oral BID  . apixaban  5 mg Oral BID  . docusate sodium  100 mg Oral BID  . ferrous sulfate  325 mg Oral BID WC  . hydrALAZINE  50 mg Oral TID  . icosapent Ethyl  2 g Oral BID  . insulin aspart  0-9 Units Subcutaneous TID WC  . insulin NPH Human  25 Units Subcutaneous BID AC & HS  . pantoprazole  40 mg Oral BID  . sodium chloride flush  3 mL Intravenous Q12H  . Tafamidis  61 mg Oral Daily  . torsemide  20 mg Oral QPM  . 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 RHC this admit showed elevated R/LH filling pressures. Weight down with diuresis.  Think she is near-euvolemic.  Now on po torsemide. Creatinine lower at 3.24 today (baseline 2.8-3.2)  - Continue torsemide 40 qam/20 qpm (was on 20 mg bid at home prior to admission).   - follow BMP  2. CKD stage 3b:  Creatinine up and down this admission. Per nephrology, Creatinine baseline 2.8-3.2. 3.24 today  - Continue torsemide as above.  3. CAD: Cath in 8/19 with moderate RCA and LCx disease, severe distal LAD disease, medical management.No chest pain.  - No ASA given apixaban use.  - Continue pitivastatin 4 mg daily and Praluent.  - Continue vascepa, stopped fenofibrate with renal dysfunction.  4. Atrial fibrillation: Paroxysmal.Went into afib w/ RVR on 11/25. Converted back to NSR on amio gtt.  Remains in NSR today.  - Continue Eliquis.   - Continue amiodarone 200 mg po bid x 1 week then  back to qd.   5. ATF:TDDUKGURKYHC on admit. She is off Coreg with junctional bradycardia at 7/21 admission. She cannot take amlodipine due to peripheral edema formation. BP now stable.  - Hold off on ACEI/ARB with CKD.  -Continue hydralazine 50 mg three times a day.  6. PAD: S/p PTCA to right popliteal and right AT in 7/20. Now with left distal SFA-TP trunk bypass and left TMA in 2/21. Ordered peripheral arterial dopplers with LE ulcerations to see if there is worsening PAD. Limited study. Left popliteal artery appears occluded. Distal PT and AT appear occluded. Unable to visualize bypass graft. [facility] has seen. Given that patient has extensive history with vascular surgery of [name] and that her left leg is not acutely ischemic, recommended short order follow up with [facility] vascular surgery when medically stable for discharge home - She has followup with vascular surgery at [facility].  7. 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  - She is on tafamidis, continue.   8. S/p TAVR: echo this admit shows gradients across the TAVR prosthesis have increased and dimensionless index has worsened compared to 12/10/2019. By TEE, The TAVR valve appears to open relatively normally, but has a significantly elevated mean gradient (29 mmHg).  The valve is relatively small and also suspect volume overload with high flow. The valve does not appear thrombosed. 9. Enterococcal endocarditis: Involving mitral valve, 7/21 TEE showed possible old calcified vegetation. TEE repeated this admit>>There is a 1.4 cm calcified nodule attached to the posterior leaflet (seen on prior studies).  This may be sequelae of remote endocarditis.  There is mild to moderate mitral regurgitation and mild mitral stenosis with mean gradient 5 mmHg but short pressure halftime. 10. Hyperlipidemia: Lipids good on 8/21 panel, TGs controlled now. Continue current regimen (except off fenofibrate with renal dysfunction).  11. Mitral valve disease: TEE with mild-moderate MR, mild MS.  12. GERD: - ContinueProtonix 40 mg daily. 13. LLE Cellulitis: Completed 2 rounds or oral antibiotics PTA. Blood cultures obtained 07/02/20. NGTD completed course of IV Vanc, now resolved.  14. DMII: 04/2020 Hgb A1C 9.9. Has had some hypoglycemia this admission.  - TRH managing regimen  15. Anemia , Iron deficient: Followed by Dr Fuller Plan Had planned EGD/Colonoscopy but this was postponed due to respiratory studies. Hgb down to 7.4 this admit . No obvious source of bleeding.  MCV persistently low. Given Feraheme 11/21. Eliquis restarted. Hgb stable today at 10.3.  - Dr. Henrene Pastor has seen, recommend outpatient f/u  16. Debility - continue to mobilize w/ PT - they are recommending [facility] at d/c. Will place order   Dispo: possible d/c home today. MD to see.   Lyda Jester, PA-C  07/13/2020 9:19 AM  Patient seen with PA, agree with the above note.    She feels good today,  no dyspnea.  Creatinine higher at 3.2 today but has been up and down in the 3-3.2 range this hospitalization.  Hgb higher at 10.3.   I will let her go home today.  She will need followup in CHF clinic in 2 wks.  Meds for discharge: torsemide 40 qam/20 qpm, amiodarone 200 mg bid x 1 week then 200 mg daily, apixaban 5 mg bid, hydralazine 50 mg tid, Vascepa, tafamidis 61 daily.   [name] 07/13/2020 11:56 AM

## 2020-07-13 NOTE — Care Management Important Message (Signed)
Important Message  Patient Details  Name: IRJA WHELESS MRN: [id number] Date of Birth: [birth year]   Medicare Important Message Given:  Yes     Shelda Altes 07/13/2020, 11:50 AM

## 2020-07-14 MED FILL — VYNDAMAX 61 MG CAPS: 61 | 30 days supply | Qty: 30 | Fill #10

## 2020-07-14 NOTE — Discharge Summary (Signed)
Advanced Heart Failure Team  Discharge Summary   Patient ID: [name] MRN: [id number], DOB/AGE: 1951-01-12 69 y.o. Admit date: 07/02/2020 D/C date:     07/13/2020   Primary Discharge Diagnoses:  Acute on Chronic Diastolic Heart Failure AKI on CKD Lower Extremity Cellulitis  PAF PAD Chronic Anemia TTR Cardiac Amyloidosis  H/o Aortic Stenosis, s/p TAVR  HTN OSA    Hospital Course:   [name] Lawsonis a 69 y.o.femalewho has a history of AS s/p TAVR, chronic diastolic CHF, PAF, CKD III, HLD, HTN, OSA, and morbid obesity.   She was admitted from [facility] office2/5/20with marked volume overload. She diuresed 36 lbs with lasix drip and was transitioned to torsemide 40 mg daily. Course complicated by AKI. RHC completedand showed elevated filling pressures and preserved cardiac output.PYP scan suggestive of TTR amyloid. Discharge weight was 208 pounds.   She developed a non-healing right heal ulcer followed by a podiatrist in [place]. She ended up having a peripheral angiogram in [place] with PTCA to the right popliteal and right AT.   In 9/20, she was admitted with fever and found to have Enterococcal bacteremia. TEE was concerning for possible MV vegetation, bioprosthetic aortic valve looked ok. She was treated with IV antibiotics, and repeat TEE in 10/20 did not show residual active endocarditis.   She is now s/p left distal SFA-tibioperoneal trunk bypass with left TMA done at [facility] in 2/21.   She was admitted in 7/21 junctional bradycardia/hypotension. Coreg and amiodarone stopped. She went into atrial fibrillation and ended up getting TEE-guided DCCV. Amiodarone was restarted. She was also treated for PNA and UTI.   She had RSV 05/2020.  Evaluated in the ED on 06/17/2020 for LLE redness. Doppler was negative for DVT. Treated for cellulitiswith keflex. She has had couple rounds of antibiotics with no improvement in LLE.  Seen in [facility]  11/18 for f/u. Overall feelingterrible. Complained of back and LLE wound.Had copious serous exudate from LLE.SOB with minimal exertion. + orthopnea. Directly admitted from clinic for IV Lasix + IV abx for cellulitis.   She diuresed w/ IV lasix but had bump in SCr, requiring nephrology consultation. They assisted w/ diuretic managment. SCr stabilized ~3.2 (new baseline). She was transitioned back to PO torsemide, 40 qam/20 qpm   LEE cellulitis resolved w/ IV abx. Blood cultures negative. Peripheral arterial dopplers were performed given LE ulcerations to see if there is worsening PAD. This was a limited study but showed left popliteal artery appears occluded. Distal PT and AT appear occluded. Unable to visualize bypass graft. VVS evaluated. Given that patient has extensive history with vascular surgery of [name] and that her left leg is not acutely ischemic recommended short order follow up with [facility] vascular surgery when medically stable for discharge home. This was arranged.   She also had brief afib w/ RVR and was placed on amio gtt w/ quick reversion back to NSR. She was transitioned back to PO amiodarone and was instructed to take 200 mg bid x 7 days post d/c and then return to 200 mg once daily.   PT evaluated and recommended HH PT, which was arranged.   On 11/29, she was last seen and examined by Dr. Aundra Dubin and felt stable for discharge home. Post hospital f/u has been arranged in [facility].   She detailed Hospital Problem List below   1.Acute/Chronic diastolic CHF: TEE in 8/84 showed EF 55-60%, mild LVH, stable bioprosthetic aortic valve s/p TAVR.PYP scan in 2/20 suggestive of transthyretin amyloidosis, genetic testing  suggestive of wild type. RHC this admit showed elevated R/LH filling pressures. Weight down with diuresis.  Think she is near-euvolemic.  Now on po torsemide. Creatinine lower at 3.24 today (baseline 2.8-3.2)  - Continue torsemide 40 qam/20 qpm (was on 20 mg bid at home  prior to admission).   - follow BMP  2. CKD stage 3b:  Creatinine up and down this admission. Per nephrology, Creatinine baseline 2.8-3.2. 3.24 today  - Continue torsemide as above.  3. CAD: Cath in 8/19 with moderate RCA and LCx disease, severe distal LAD disease, medical management.No chest pain.  - No ASA given apixaban use.  - Continue pitivastatin 4 mg daily and Praluent.  - Continue vascepa, stopped fenofibrate with renal dysfunction.  4. Atrial fibrillation: Paroxysmal.Went into afib w/ RVR on 11/25. Converted back to NSR on amio gtt.  Remains in NSR today.  - Continue Eliquis.   - Continue amiodarone 200 mg po bid x 1 week then back to qd.   5. ZDG:LOVFIEPPIRJJ on admit. She is off Coreg with junctional bradycardia at 7/21 admission. She cannot take amlodipine due to peripheral edema formation. BP now stable.  - Hold off on ACEI/ARB with CKD.  -Continue hydralazine 50 mg three times a day.  6. PAD: S/p PTCA to right popliteal and right AT in 7/20. Now with left distal SFA-TP trunk bypass and left TMA in 2/21. Ordered peripheral arterial dopplers with LE ulcerations to see if there is worsening PAD. Limited study. Left popliteal artery appears occluded. Distal PT and AT appear occluded. Unable to visualize bypass graft. VVS has seen. Given that patient has extensive history with vascular surgery of [name] and that her left leg is not acutely ischemic, recommended short order follow up with [facility] vascular surgery when medically stable for discharge home - She has followup with vascular surgery at [facility].  7. 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  - She is on tafamidis, continue.   8. S/p TAVR: echo this admit shows gradients across the TAVR prosthesis have increased and dimensionless index has worsened compared to 12/10/2019. By TEE, The TAVR valve appears to open relatively  normally, but has a significantly elevated mean gradient (29 mmHg). The valve is relatively small and also suspect volume overload with high flow. The valve does not appear thrombosed. 9. Enterococcal endocarditis: Involving mitral valve, 7/21 TEE showed possible old calcified vegetation. TEE repeated this admit>>There is a 1.4 cm calcified nodule attached to the posterior leaflet (seen on prior studies). This may be sequelae of remote endocarditis. There is mild to moderate mitral regurgitation and mild mitral stenosis with mean gradient 5 mmHg but short pressure halftime. 10. Hyperlipidemia: Lipids good on 8/21 panel, TGs controlled now. Continue current regimen (except off fenofibrate with renal dysfunction).  11. Mitral valve disease: TEE with mild-moderate MR, mild MS.  12. GERD: - ContinueProtonix 40 mg daily. 13. LLE Cellulitis: Completed 2 rounds or oral antibiotics PTA. Blood cultures obtained 07/02/20. NGTD completed course of IV Vanc, now resolved.  14. DMII: 04/2020 Hgb A1C 9.9. Has had some hypoglycemia this admission.  - TRH managing regimen  15. Anemia , Iron deficient: Followed by Dr Fuller Plan Had planned EGD/Colonoscopy but this was postponed due to respiratory studies. Hgb down to 7.4 this admit . No obvious source of bleeding.  MCV persistently low. Given Feraheme 11/21. Eliquis restarted. Hgb stable today at 10.3.  - Dr. Henrene Pastor has seen, recommend outpatient f/u  60. Debility - continue to mobilize w/ PT - they are recommending [facility] at d/c. Will place order   Discharge Weight Range: 189 lb  Discharge Vitals: Blood pressure (!) 115/52, pulse 79, temperature 97.9 F (36.6 C), temperature source Oral, resp. rate 18, height 5\' 6"  (1.676 m), weight 86 kg, SpO2 94 %.  Labs: Lab Results  Component Value Date   WBC 6.8 07/13/2020   HGB 10.3 (L) 07/13/2020   HCT 34.7 (L) 07/13/2020   MCV 79.2 (L) 07/13/2020   PLT 249 07/13/2020    Recent Labs  Lab 07/13/20 0723  NA  139  K 3.6  CL 92*  CO2 31  BUN 61*  CREATININE 3.24*  CALCIUM 8.6*  GLUCOSE 117*   Lab Results  Component Value Date   CHOL 122 03/31/2020   HDL 45 03/31/2020   LDLCALC 53 03/31/2020   TRIG 140 03/31/2020   BNP (last 3 results) Recent Labs    02/21/20 1151 06/03/20 1354 07/02/20 1628  BNP 538.2* 718.7* 1,102.7*    ProBNP (last 3 results) No results for input(s): PROBNP in the last 8760 hours.   Diagnostic Studies/Procedures   No results found.  Discharge Medications   Allergies as of 07/13/2020      Reactions   Amlodipine Swelling   Ankle swelling   Atorvastatin Other (See Comments)   Myalgias   Crestor [rosuvastatin Calcium] Other (See Comments)   Stiffness and back pain   Ezetimibe-simvastatin Other (See Comments)   Leg cramps   Sitagliptin Other (See Comments)   Statins Other (See Comments)   Myalgias   Celebrex [celecoxib] Rash   Levemir [insulin Detemir] Rash      Medication List    STOP taking these medications   fenofibrate 145 MG tablet Commonly known as: TRICOR   guaiFENesin-codeine 100-10 MG/5ML syrup   nystatin cream Commonly known as: MYCOSTATIN   sulfamethoxazole-trimethoprim 400-80 MG tablet Commonly known as: Bactrim   VITAMIN B-12 PO     TAKE these medications   Accu-Chek Aviva Plus test strip Generic drug: glucose blood USE TO CHECK BLOOD SUGAR UP TO TWICE DAILY OR AS INSTRUCTED What changed: See the new instructions.   amiodarone 200 MG tablet Commonly known as: PACERONE Take 1 tablet 2 times a day for 7 days, then reduce down to 1 tablet once daily What changed:   how much to take  how to take this  when to take this  additional instructions   B-D INS SYR ULTRAFINE 1CC/31G 31G X 5/16" 1 ML Misc Generic drug: Insulin Syringe-Needle U-100 USE TO INJECT INSULIN TWICE A DAY AS INSTRUCTED What changed: See the new instructions.   ReliOn Insulin Syringe 31G X 15/64" 1 ML Misc Generic drug: Insulin  Syringe-Needle U-100 Inject 1 Syringe into the skin 3 (three) times daily. What changed: Another medication with the same name was changed. Make sure you understand how and when to take each.   benzonatate 100 MG capsule Commonly known as: Tessalon Perles Take 1 capsule (100 mg total) by mouth 3 (three) times daily as needed.   CALCIUM 600 PO Take 1 tablet by mouth daily.   docusate sodium 50 MG capsule Commonly known as: COLACE Take 2 capsules (100 mg total) by mouth 2 (two) times daily. What changed: when to take this   Eliquis 5 MG Tabs tablet Generic drug: apixaban TAKE 1 TABLET BY MOUTH TWICE A DAY What changed: how much to take   ferrous sulfate 325 (65 FE) MG  tablet Commonly known as: FerrouSul Take 1 tablet (325 mg total) by mouth 2 (two) times daily with a meal.   hydrALAZINE 50 MG tablet Commonly known as: APRESOLINE Take 50 mg by mouth 3 (three) times daily.   insulin NPH Human 100 UNIT/ML injection Commonly known as: NOVOLIN N Inject 0.25 mLs (25 Units total) into the skin 2 (two) times daily at 8 am and 10 pm. What changed:   how much to take  when to take this  additional instructions   Insulin Pen Needle 32G X 4 MM Misc Use to inject insulin with insulin pen   Livalo 4 MG Tabs Generic drug: Pitavastatin Calcium Take 4 mg by mouth daily.   loratadine 10 MG tablet Commonly known as: CLARITIN Take 10 mg by mouth daily.   meclizine 12.5 MG tablet Commonly known as: ANTIVERT Take 1 tablet (12.5 mg total) by mouth 3 (three) times daily as needed for dizziness.   MULTIVITAMIN GUMMIES ADULT PO Taking two gummies by mouth daily   NovoLIN R ReliOn 100 units/mL injection Generic drug: insulin regular Inject 20-30 Units into the skin 3 (three) times daily before meals. Per sliding scale   nutrition supplement (JUVEN) Pack Take 1 packet by mouth 2 (two) times daily between meals.   pantoprazole 40 MG tablet Commonly known as: PROTONIX Take 1 tablet  (40 mg total) by mouth daily.   Praluent 75 MG/ML Soaj Generic drug: Alirocumab Inject 75 mg into the skin every 14 (fourteen) days.   torsemide 20 MG tablet Commonly known as: DEMADEX Take 40 mg (2 tablets) every morning and 20 mg (1 tablet) every evening What changed:   how much to take  how to take this  when to take this  additional instructions   Vascepa 1 g capsule Generic drug: icosapent Ethyl TAKE 2 CAPSULES (2 G TOTAL) BY MOUTH 2 (TWO) TIMES A DAY. What changed: See the new instructions.   VITAMIN D3 PO Take 1 tablet by mouth daily.   Vyndamax 61 MG Caps Generic drug: Tafamidis Take 61 mg by mouth daily.       Disposition   The patient will be discharged in stable condition to home. Discharge Instructions    Face-to-face encounter (required for Medicare/Medicaid patients)   Complete by: As directed    I Lyda Jester certify that this patient is under my care and that I, or a nurse practitioner or physician's assistant working with me, had a face-to-face encounter that meets the physician face-to-face encounter requirements with this patient on 07/13/2020. The encounter with the patient was in whole, or in part for the following medical condition(s) which is the primary reason for home health care (List medical condition): chronic diastolic heart failure   The encounter with the patient was in whole, or in part, for the following medical condition, which is the primary reason for home health care: chronic diastolic heart failure   I certify that, based on my findings, the following services are medically necessary home health services: Physical therapy   Reason for Medically Necessary Home Health Services: Skilled Nursing- Change/Decline in Patient Status   My clinical findings support the need for the above services: Shortness of breath with activity   Further, I certify that my clinical findings support that this patient is homebound due to: Shortness of  Breath with activity   Home Health   Complete by: As directed    To provide the following care/treatments: PT      Follow-up Information  [facility] Follow up on 07/23/2020.   Specialty: Cardiology Why: Advanced Heart Failure Clinic 8:30 AM  Parking Garage Code [id number] Contact information: [address] [id number] [place] [phone]       Care, [facility] Follow up.   Specialty: [place] Why: HHPT/RN services to resume Contact information: [place] [address] [phone]                 Duration of Discharge Encounter: Greater than 35 minutes   Signed, Nelida Gores 07/13/2020, 3:25 PM

## 2020-07-15 DIAGNOSIS — I2510 Atherosclerotic heart disease of native coronary artery without angina pectoris: Secondary | ICD-10-CM | POA: Diagnosis not present

## 2020-07-15 DIAGNOSIS — I959 Hypotension, unspecified: Secondary | ICD-10-CM | POA: Diagnosis not present

## 2020-07-15 DIAGNOSIS — E1122 Type 2 diabetes mellitus with diabetic chronic kidney disease: Secondary | ICD-10-CM | POA: Diagnosis not present

## 2020-07-15 DIAGNOSIS — G8929 Other chronic pain: Secondary | ICD-10-CM | POA: Diagnosis not present

## 2020-07-15 DIAGNOSIS — N1832 Chronic kidney disease, stage 3b: Secondary | ICD-10-CM | POA: Diagnosis not present

## 2020-07-15 DIAGNOSIS — I130 Hypertensive heart and chronic kidney disease with heart failure and stage 1 through stage 4 chronic kidney disease, or unspecified chronic kidney disease: Secondary | ICD-10-CM | POA: Diagnosis not present

## 2020-07-15 DIAGNOSIS — L97828 Non-pressure chronic ulcer of other part of left lower leg with other specified severity: Secondary | ICD-10-CM | POA: Diagnosis not present

## 2020-07-15 DIAGNOSIS — D509 Iron deficiency anemia, unspecified: Secondary | ICD-10-CM | POA: Diagnosis not present

## 2020-07-15 DIAGNOSIS — I0981 Rheumatic heart failure: Secondary | ICD-10-CM | POA: Diagnosis not present

## 2020-07-15 DIAGNOSIS — Z794 Long term (current) use of insulin: Secondary | ICD-10-CM | POA: Diagnosis not present

## 2020-07-15 DIAGNOSIS — L97228 Non-pressure chronic ulcer of left calf with other specified severity: Secondary | ICD-10-CM | POA: Diagnosis not present

## 2020-07-15 DIAGNOSIS — I5023 Acute on chronic systolic (congestive) heart failure: Secondary | ICD-10-CM | POA: Diagnosis not present

## 2020-07-15 DIAGNOSIS — G4733 Obstructive sleep apnea (adult) (pediatric): Secondary | ICD-10-CM | POA: Diagnosis not present

## 2020-07-15 DIAGNOSIS — I5033 Acute on chronic diastolic (congestive) heart failure: Secondary | ICD-10-CM | POA: Diagnosis not present

## 2020-07-15 DIAGNOSIS — I70201 Unspecified atherosclerosis of native arteries of extremities, right leg: Secondary | ICD-10-CM | POA: Diagnosis not present

## 2020-07-15 DIAGNOSIS — I872 Venous insufficiency (chronic) (peripheral): Secondary | ICD-10-CM | POA: Diagnosis not present

## 2020-07-15 DIAGNOSIS — D631 Anemia in chronic kidney disease: Secondary | ICD-10-CM | POA: Diagnosis not present

## 2020-07-15 DIAGNOSIS — E785 Hyperlipidemia, unspecified: Secondary | ICD-10-CM | POA: Diagnosis not present

## 2020-07-15 DIAGNOSIS — I051 Rheumatic mitral insufficiency: Secondary | ICD-10-CM | POA: Diagnosis not present

## 2020-07-15 DIAGNOSIS — I480 Paroxysmal atrial fibrillation: Secondary | ICD-10-CM | POA: Diagnosis not present

## 2020-07-15 DIAGNOSIS — K219 Gastro-esophageal reflux disease without esophagitis: Secondary | ICD-10-CM | POA: Diagnosis not present

## 2020-07-15 DIAGNOSIS — E1151 Type 2 diabetes mellitus with diabetic peripheral angiopathy without gangrene: Secondary | ICD-10-CM | POA: Diagnosis not present

## 2020-07-15 DIAGNOSIS — L02416 Cutaneous abscess of left lower limb: Secondary | ICD-10-CM

## 2020-07-15 DIAGNOSIS — Z792 Long term (current) use of antibiotics: Secondary | ICD-10-CM | POA: Diagnosis not present

## 2020-07-15 DIAGNOSIS — M549 Dorsalgia, unspecified: Secondary | ICD-10-CM | POA: Diagnosis not present

## 2020-07-15 DIAGNOSIS — Z953 Presence of xenogenic heart valve: Secondary | ICD-10-CM | POA: Diagnosis not present

## 2020-07-15 HISTORY — DX: Cutaneous abscess of left lower limb: L02.416

## 2020-07-15 NOTE — Telephone Encounter (Signed)
[name] called patient to reschedule and noticed she is scheduled for follow up with you on 12/3 patient is aware you will be calling.

## 2020-07-16 DIAGNOSIS — D631 Anemia in chronic kidney disease: Secondary | ICD-10-CM | POA: Diagnosis not present

## 2020-07-16 DIAGNOSIS — I130 Hypertensive heart and chronic kidney disease with heart failure and stage 1 through stage 4 chronic kidney disease, or unspecified chronic kidney disease: Secondary | ICD-10-CM | POA: Diagnosis not present

## 2020-07-16 DIAGNOSIS — E1122 Type 2 diabetes mellitus with diabetic chronic kidney disease: Secondary | ICD-10-CM | POA: Diagnosis not present

## 2020-07-16 DIAGNOSIS — I5033 Acute on chronic diastolic (congestive) heart failure: Secondary | ICD-10-CM | POA: Diagnosis not present

## 2020-07-16 DIAGNOSIS — I0981 Rheumatic heart failure: Secondary | ICD-10-CM | POA: Diagnosis not present

## 2020-07-16 DIAGNOSIS — D1801 Hemangioma of skin and subcutaneous tissue: Secondary | ICD-10-CM | POA: Diagnosis not present

## 2020-07-16 DIAGNOSIS — N1832 Chronic kidney disease, stage 3b: Secondary | ICD-10-CM | POA: Diagnosis not present

## 2020-07-16 DIAGNOSIS — L820 Inflamed seborrheic keratosis: Secondary | ICD-10-CM | POA: Diagnosis not present

## 2020-07-16 DIAGNOSIS — L299 Pruritus, unspecified: Secondary | ICD-10-CM | POA: Diagnosis not present

## 2020-07-17 ENCOUNTER — Ambulatory Visit: Payer: Medicare Other | Admitting: *Deleted

## 2020-07-17 DIAGNOSIS — I5033 Acute on chronic diastolic (congestive) heart failure: Secondary | ICD-10-CM

## 2020-07-17 DIAGNOSIS — E1159 Type 2 diabetes mellitus with other circulatory complications: Secondary | ICD-10-CM

## 2020-07-17 NOTE — Patient Instructions (Signed)
Visit Information  Goals Addressed            This Visit's Progress   . Track and Manage Fluids and Swelling       Patient Activities:  - call office if I gain more than 2 pounds in one day or 5 pounds in one week - keep legs up while sitting - track weight in diary - use salt in moderation - watch for swelling in feet, ankles and legs every day - weigh myself daily    Why is this important?    It is important to check your weight daily and watch how much salt and liquids you have.   It will help you to manage your heart failure.    Notes:     . Track and Manage My Blood Pressure       Patient Activities: - check blood pressure daily - write blood pressure results in a log or diary  - bring log to all appointments - call cardiologist or PCP with any readings outside of recommended range   Why is this important?    You won't feel high blood pressure, but it can still hurt your blood vessels.   High blood pressure can cause heart or kidney problems. It can also cause a stroke.   Making lifestyle changes like losing a little weight or eating less salt will help.   Checking your blood pressure at home and at different times of the day can help to control blood pressure.   If the doctor prescribes medicine remember to take it the way the doctor ordered.   Call the office if you cannot afford the medicine or if there are questions about it.     Notes:     . Track and Manage Symptoms       Patient Activities: - develop a rescue plan - eat more whole grains, fruits and vegetables, lean meats and healthy fats - follow rescue plan if symptoms flare-up - know when to call the doctor - track symptoms and what helps feel better or worse    Why is this important?    You will be able to handle your symptoms better if you keep track of them.   Making some simple changes to your lifestyle will help.   Eating healthy is one thing you can do to take good care of yourself.     Notes:        [name] was given information about Chronic Care Management services including:  1. CCM service includes personalized support from designated clinical staff supervised by her physician, including individualized plan of care and coordination with other care providers 2. 24/7 contact phone numbers for assistance for urgent and routine care needs. 3. Service will only be billed when office clinical staff spend 20 minutes or more in a month to coordinate care. 4. Only one practitioner may furnish and bill the service in a calendar month. 5. The patient may stop CCM services at any time (effective at the end of the month) by phone call to the office staff. 6. The patient will be responsible for cost sharing (co-pay) of up to 20% of the service fee (after annual deductible is met).  Patient agreed to services and verbal consent obtained.   The patient verbalized understanding of instructions, educational materials, and care plan provided today and declined offer to receive copy of patient instructions, educational materials, and care plan.   Telephone follow up appointment with care management  team member scheduled for:08/05/20 with RN Care Manager  Chong Sicilian, BSN, RN-BC [place] [facility] [place] Management Direct Dial: [phone]

## 2020-07-17 NOTE — Chronic Care Management (AMB) (Signed)
Chronic Care Management   Follow Up Note   07/17/2020 Name: [name] MRN: [id number] DOB: [birth year]  Referred by: [name], DO Reason for referral : Chronic Care Management (RN follow up)   [name] is a 69 y.o. year old female who is a primary care patient of [name], DO. The CCM team was consulted for assistance with chronic disease management and care coordination needs.    Review of patient status, including review of consultants reports, relevant laboratory and other test results, and collaboration with appropriate care team members and the patient's provider was performed as part of comprehensive patient evaluation and provision of chronic care management services.    SDOH (Social Determinants of Health) assessments performed: No See Care Plan activities for detailed interventions related to [facility])     Outpatient Encounter Medications as of 07/17/2020  Medication Sig  . ACCU-CHEK AVIVA PLUS test strip USE TO CHECK BLOOD SUGAR UP TO TWICE DAILY OR AS INSTRUCTED (Patient taking differently: 1 each by Other route 2 (two) times daily. )  . Alirocumab (PRALUENT) 75 MG/ML SOAJ Inject 75 mg into the skin every 14 (fourteen) days.  Marland Kitchen amiodarone (PACERONE) 200 MG tablet Take 1 tablet 2 times a day for 7 days, then reduce down to 1 tablet once daily  . B-D INS SYR ULTRAFINE 1CC/31G 31G X 5/16" 1 ML MISC USE TO INJECT INSULIN TWICE A DAY AS INSTRUCTED (Patient taking differently: 1 Stick by Other route 3 (three) times daily before meals. )  . benzonatate (TESSALON PERLES) 100 MG capsule Take 1 capsule (100 mg total) by mouth 3 (three) times daily as needed.  . Calcium Carbonate (CALCIUM 600 PO) Take 1 tablet by mouth daily.  . Cholecalciferol (VITAMIN D3 PO) Take 1 tablet by mouth daily.   Marland Kitchen docusate sodium (COLACE) 50 MG capsule Take 2 capsules (100 mg total) by mouth 2 (two) times daily. (Patient taking differently: Take 100 mg by mouth daily. )  .  ELIQUIS 5 MG TABS tablet TAKE 1 TABLET BY MOUTH TWICE A DAY (Patient taking differently: Take 5 mg by mouth 2 (two) times daily. )  . ferrous sulfate (FERROUSUL) 325 (65 FE) MG tablet Take 1 tablet (325 mg total) by mouth 2 (two) times daily with a meal.  . hydrALAZINE (APRESOLINE) 50 MG tablet Take 50 mg by mouth 3 (three) times daily.  . insulin NPH Human (NOVOLIN N) 100 UNIT/ML injection Inject 0.25 mLs (25 Units total) into the skin 2 (two) times daily at 8 am and 10 pm.  . Insulin Pen Needle 32G X 4 MM MISC Use to inject insulin with insulin pen  . loratadine (CLARITIN) 10 MG tablet Take 10 mg by mouth daily.   . meclizine (ANTIVERT) 12.5 MG tablet Take 1 tablet (12.5 mg total) by mouth 3 (three) times daily as needed for dizziness.  . Multiple Vitamins-Minerals (MULTIVITAMIN GUMMIES ADULT PO) Taking two gummies by mouth daily  . NOVOLIN R RELION 100 UNIT/ML injection Inject 20-30 Units into the skin 3 (three) times daily before meals. Per sliding scale  . nutrition supplement, JUVEN, (JUVEN) PACK Take 1 packet by mouth 2 (two) times daily between meals.  . pantoprazole (PROTONIX) 40 MG tablet Take 1 tablet (40 mg total) by mouth daily.  . Pitavastatin Calcium (LIVALO) 4 MG TABS Take 4 mg by mouth daily.   Marland Kitchen RELION INSULIN SYRINGE 31G X 15/64" 1 ML MISC Inject 1 Syringe into the skin 3 (three) times  daily.  . Tafamidis (VYNDAMAX) 61 MG CAPS Take 61 mg by mouth daily.  Marland Kitchen torsemide (DEMADEX) 20 MG tablet Take 40 mg (2 tablets) every morning and 20 mg (1 tablet) every evening  . VASCEPA 1 g capsule TAKE 2 CAPSULES (2 G TOTAL) BY MOUTH 2 (TWO) TIMES A DAY. (Patient taking differently: Take 4 g by mouth 2 (two) times daily. )  . [DISCONTINUED] Olmesartan-Amlodipine-HCTZ (TRIBENZOR) 40-5-12.5 MG TABS Take 1 tablet by mouth daily.     No facility-administered encounter medications on file as of 07/17/2020.      Patient Care Plan: Heart Failure (Adult)  Problem Identified: Heart Failure Management    Priority: High  Note:   CHF in a patient with Afib, HTN, CAD, DM, CKD stage 4, HLD, osteoporosis    Long-Range Goal: Comorbidities Identified and Managed   This Visit's Progress: Not on track  Priority: High  Note:   Current Barriers:  Marland Kitchen Knowledge deficit related to basic heart failure pathophysiology and self care management . Impaired mobility  Case Manager Clinical Goal(s):   Over the next 30 days, patient will weigh self daily and record  Over the next 90 days, patient will take all Heart Failure mediations as prescribed  Over the next 30 days, patient will weigh daily and record (notifying MD of 3 lb weight gain over night or 5 lb in a week)  Interventions:  . Chart reviewed including recent office notes, hospital notes, lab results, and imaging studies . Basic overview and discussion of pathophysiology of Heart Failure reviewed  . Advised patient to weigh each morning after emptying bladder . Discussed importance of daily weight and advised patient to weigh and record daily . Reviewed role of diuretics in prevention of fluid overload and management of heart failure  Assessed and addressed signs/symptoms of comorbidity, including dyslipidemia, diabetes, iron deficiency, gout, arthritis, dysrhythmia, hypertension, cachexia, coronary artery disease, kidney dysfunction and lung disease.   Educated to monitor side effects of medications and anticipate need for periodic adjustments.   Appointments reviewed and discussed. Follow-up with PCP and cardiologist as planned.  Encouraged patient to reach out to [facility] as needed  Patient Self Care Activities:   Takes Heart Failure Medications as prescribed  Weighs daily and record (notifying MD of 3 lb weight gain over night or 5 lb in a week)  Verbalizes understanding of and follows CHF Action Plan  call office if I gain more than 2 pounds in one day or 5 pounds in one week  keep legs up while sitting  track weight  in diary  use salt in moderation  watch for swelling in feet, ankles and legs every day  weigh myself daily   develop a rescue plan  eat more whole grains, fruits and vegetables, lean meats and healthy fats  follow rescue plan if symptoms flare-up  know when to call the doctor  track symptoms and what helps feel better or worse   Initial goal documentation    Patient Care Plan: Hypertension (Adult)  Problem Identified: Hypertension (Hypertension)   Priority: High  Note:   Hypertension in a patient with CHF, Afib,CAD, DM, CKD stage 4, HLD, osteoporosis    Long-Range Goal: Disease Monitored & Progression Prevented or Minimized   This Visit's Progress: Not on track  Priority: High  Note:   Objective: Last practice recorded BP readings:  BP Readings from Last 3 Encounters:  07/13/20 (!) 115/52  07/02/20 (!) 144/72  06/24/20 (!) 142/56  Current Barriers:   Impaired mobility  Episodes of hypotension  Case Manager Clinical Goal(s):   Over the next 30 days, patient will verbalize understanding of plan for hypertension management  Over the next 90 days, patient will monitor blood pressure daily and call PCP or cardiologist with any readings outside of recommended range  Interventions:   Evaluation of current treatment plan related to hypertension self management and patient's adherence to plan as established by provider.  Reviewed medications with patient and discussed importance of compliance  Discussed plans with patient for ongoing care management follow up and provided patient with direct contact information for care management team  Advised patient, providing education and rationale, to monitor blood pressure daily and record, calling PCP for findings outside established parameters.   Reviewed scheduled/upcoming provider appointments including  Patient Self Care Activities:   Self administers medications as prescribed  Attends all scheduled provider  appointments  Calls provider office for new concerns, questions, or BP outside discussed parameters  Checks BP and records as discussed  Write blood pressure results in a log or diary   Bring log to all appointments  Initial goal documentation       Plan:  Telephone follow up appointment with care management team member scheduled for: 08/05/2020   Chong Sicilian, BSN, RN-BC [facility] Direct Dial: [phone]

## 2020-07-20 ENCOUNTER — Encounter (HOSPITAL_COMMUNITY): Payer: Self-pay | Admitting: Emergency Medicine

## 2020-07-20 ENCOUNTER — Emergency Department (HOSPITAL_COMMUNITY): Payer: Medicare Other

## 2020-07-20 ENCOUNTER — Telehealth (HOSPITAL_COMMUNITY): Payer: Self-pay | Admitting: *Deleted

## 2020-07-20 ENCOUNTER — Inpatient Hospital Stay (HOSPITAL_COMMUNITY)
Admission: EM | Admit: 2020-07-20 | Discharge: 2020-07-23 | DRG: 603 | Disposition: A | Discharge status: Home Health | Payer: Medicare Other | Attending: Internal Medicine | Admitting: Internal Medicine

## 2020-07-20 DIAGNOSIS — I130 Hypertensive heart and chronic kidney disease with heart failure and stage 1 through stage 4 chronic kidney disease, or unspecified chronic kidney disease: Secondary | ICD-10-CM | POA: Diagnosis present

## 2020-07-20 DIAGNOSIS — I739 Peripheral vascular disease, unspecified: Secondary | ICD-10-CM | POA: Diagnosis not present

## 2020-07-20 DIAGNOSIS — I5032 Chronic diastolic (congestive) heart failure: Secondary | ICD-10-CM | POA: Diagnosis not present

## 2020-07-20 DIAGNOSIS — E785 Hyperlipidemia, unspecified: Secondary | ICD-10-CM | POA: Diagnosis present

## 2020-07-20 DIAGNOSIS — I252 Old myocardial infarction: Secondary | ICD-10-CM

## 2020-07-20 DIAGNOSIS — Z87442 Personal history of urinary calculi: Secondary | ICD-10-CM

## 2020-07-20 DIAGNOSIS — G4730 Sleep apnea, unspecified: Secondary | ICD-10-CM | POA: Diagnosis present

## 2020-07-20 DIAGNOSIS — D631 Anemia in chronic kidney disease: Secondary | ICD-10-CM | POA: Diagnosis present

## 2020-07-20 DIAGNOSIS — M79662 Pain in left lower leg: Secondary | ICD-10-CM | POA: Diagnosis not present

## 2020-07-20 DIAGNOSIS — Z833 Family history of diabetes mellitus: Secondary | ICD-10-CM

## 2020-07-20 DIAGNOSIS — L03116 Cellulitis of left lower limb: Secondary | ICD-10-CM | POA: Diagnosis not present

## 2020-07-20 DIAGNOSIS — Z79899 Other long term (current) drug therapy: Secondary | ICD-10-CM

## 2020-07-20 DIAGNOSIS — Z953 Presence of xenogenic heart valve: Secondary | ICD-10-CM

## 2020-07-20 DIAGNOSIS — L0390 Cellulitis, unspecified: Secondary | ICD-10-CM | POA: Diagnosis present

## 2020-07-20 DIAGNOSIS — M810 Age-related osteoporosis without current pathological fracture: Secondary | ICD-10-CM | POA: Diagnosis present

## 2020-07-20 DIAGNOSIS — I2510 Atherosclerotic heart disease of native coronary artery without angina pectoris: Secondary | ICD-10-CM | POA: Diagnosis present

## 2020-07-20 DIAGNOSIS — Z89422 Acquired absence of other left toe(s): Secondary | ICD-10-CM | POA: Diagnosis not present

## 2020-07-20 DIAGNOSIS — Z823 Family history of stroke: Secondary | ICD-10-CM

## 2020-07-20 DIAGNOSIS — N184 Chronic kidney disease, stage 4 (severe): Secondary | ICD-10-CM | POA: Diagnosis not present

## 2020-07-20 DIAGNOSIS — M79605 Pain in left leg: Secondary | ICD-10-CM | POA: Diagnosis not present

## 2020-07-20 DIAGNOSIS — Z20822 Contact with and (suspected) exposure to covid-19: Secondary | ICD-10-CM | POA: Diagnosis present

## 2020-07-20 DIAGNOSIS — K219 Gastro-esophageal reflux disease without esophagitis: Secondary | ICD-10-CM | POA: Diagnosis present

## 2020-07-20 DIAGNOSIS — Z808 Family history of malignant neoplasm of other organs or systems: Secondary | ICD-10-CM

## 2020-07-20 DIAGNOSIS — Z9884 Bariatric surgery status: Secondary | ICD-10-CM

## 2020-07-20 DIAGNOSIS — Z794 Long term (current) use of insulin: Secondary | ICD-10-CM

## 2020-07-20 DIAGNOSIS — K449 Diaphragmatic hernia without obstruction or gangrene: Secondary | ICD-10-CM | POA: Diagnosis present

## 2020-07-20 DIAGNOSIS — Z888 Allergy status to other drugs, medicaments and biological substances status: Secondary | ICD-10-CM

## 2020-07-20 DIAGNOSIS — E1165 Type 2 diabetes mellitus with hyperglycemia: Secondary | ICD-10-CM | POA: Diagnosis present

## 2020-07-20 DIAGNOSIS — E1151 Type 2 diabetes mellitus with diabetic peripheral angiopathy without gangrene: Secondary | ICD-10-CM | POA: Diagnosis present

## 2020-07-20 DIAGNOSIS — Z8371 Family history of colonic polyps: Secondary | ICD-10-CM

## 2020-07-20 DIAGNOSIS — R238 Other skin changes: Secondary | ICD-10-CM | POA: Diagnosis not present

## 2020-07-20 DIAGNOSIS — I480 Paroxysmal atrial fibrillation: Secondary | ICD-10-CM | POA: Diagnosis present

## 2020-07-20 DIAGNOSIS — M79672 Pain in left foot: Secondary | ICD-10-CM | POA: Diagnosis not present

## 2020-07-20 DIAGNOSIS — Z8249 Family history of ischemic heart disease and other diseases of the circulatory system: Secondary | ICD-10-CM

## 2020-07-20 DIAGNOSIS — E1122 Type 2 diabetes mellitus with diabetic chronic kidney disease: Secondary | ICD-10-CM | POA: Diagnosis present

## 2020-07-20 DIAGNOSIS — I69311 Memory deficit following cerebral infarction: Secondary | ICD-10-CM

## 2020-07-20 DIAGNOSIS — Z049 Encounter for examination and observation for unspecified reason: Secondary | ICD-10-CM | POA: Diagnosis not present

## 2020-07-20 DIAGNOSIS — R531 Weakness: Secondary | ICD-10-CM | POA: Diagnosis not present

## 2020-07-20 DIAGNOSIS — M7989 Other specified soft tissue disorders: Secondary | ICD-10-CM | POA: Diagnosis not present

## 2020-07-20 DIAGNOSIS — R0602 Shortness of breath: Secondary | ICD-10-CM | POA: Diagnosis not present

## 2020-07-20 LAB — CBC WITH DIFFERENTIAL/PLATELET
Abs Immature Granulocytes: 0.06 10*3/uL (ref 0.00–0.07)
Basophils Absolute: 0.1 10*3/uL (ref 0.0–0.1)
Basophils Relative: 1 %
Eosinophils Absolute: 0.1 10*3/uL (ref 0.0–0.5)
Eosinophils Relative: 0 %
HCT: 38.1 % (ref 36.0–46.0)
Hemoglobin: 10.8 g/dL — ABNORMAL LOW (ref 12.0–15.0)
Immature Granulocytes: 1 %
Lymphocytes Relative: 6 %
Lymphs Abs: 0.7 10*3/uL (ref 0.7–4.0)
MCH: 24.3 pg — ABNORMAL LOW (ref 26.0–34.0)
MCHC: 28.3 g/dL — ABNORMAL LOW (ref 30.0–36.0)
MCV: 85.6 fL (ref 80.0–100.0)
Monocytes Absolute: 0.8 10*3/uL (ref 0.1–1.0)
Monocytes Relative: 7 %
Neutro Abs: 10 10*3/uL — ABNORMAL HIGH (ref 1.7–7.7)
Neutrophils Relative %: 85 %
Platelets: 303 10*3/uL (ref 150–400)
RBC: 4.45 MIL/uL (ref 3.87–5.11)
RDW: 26.5 % — ABNORMAL HIGH (ref 11.5–15.5)
WBC: 11.7 10*3/uL — ABNORMAL HIGH (ref 4.0–10.5)
nRBC: 0 % (ref 0.0–0.2)

## 2020-07-20 LAB — COMPREHENSIVE METABOLIC PANEL
ALT: 51 U/L — ABNORMAL HIGH (ref 0–44)
AST: 50 U/L — ABNORMAL HIGH (ref 15–41)
Albumin: 2.8 g/dL — ABNORMAL LOW (ref 3.5–5.0)
Alkaline Phosphatase: 64 U/L (ref 38–126)
Anion gap: 12 (ref 5–15)
BUN: 47 mg/dL — ABNORMAL HIGH (ref 8–23)
CO2: 31 mmol/L (ref 22–32)
Calcium: 9 mg/dL (ref 8.9–10.3)
Chloride: 95 mmol/L — ABNORMAL LOW (ref 98–111)
Creatinine, Ser: 2.42 mg/dL — ABNORMAL HIGH (ref 0.44–1.00)
GFR, Estimated: 21 mL/min — ABNORMAL LOW (ref >60)
Glucose, Bld: 180 mg/dL — ABNORMAL HIGH (ref 70–99)
Potassium: 3.9 mmol/L (ref 3.5–5.1)
Sodium: 138 mmol/L (ref 135–145)
Total Bilirubin: 1.6 mg/dL — ABNORMAL HIGH (ref 0.3–1.2)
Total Protein: 6.8 g/dL (ref 6.5–8.1)

## 2020-07-20 LAB — CBG MONITORING, ED: Glucose-Capillary: 144 mg/dL — ABNORMAL HIGH (ref 70–99)

## 2020-07-20 LAB — LACTIC ACID, PLASMA: Lactic Acid, Venous: 1.7 mmol/L (ref 0.5–1.9)

## 2020-07-20 LAB — PROTIME-INR
INR: 1.9 — ABNORMAL HIGH (ref 0.8–1.2)
Prothrombin Time: 20.8 seconds — ABNORMAL HIGH (ref 11.4–15.2)

## 2020-07-20 NOTE — Telephone Encounter (Signed)
Pt left VM stating she called the ambulance she is having trouble breathing and her leg is "throbbing".   Dr.McLean aware.

## 2020-07-20 NOTE — ED Triage Notes (Signed)
Pt arrives via ems from home with c/o of left leg pain and swelling. 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

## 2020-07-20 NOTE — ED Notes (Signed)
Pt's CBG result was 144. Informed Scar[facility] - RN.

## 2020-07-21 ENCOUNTER — Other Ambulatory Visit: Payer: Self-pay

## 2020-07-21 ENCOUNTER — Emergency Department (HOSPITAL_COMMUNITY): Payer: Medicare Other

## 2020-07-21 DIAGNOSIS — Z20822 Contact with and (suspected) exposure to covid-19: Secondary | ICD-10-CM | POA: Diagnosis present

## 2020-07-21 DIAGNOSIS — Z9884 Bariatric surgery status: Secondary | ICD-10-CM | POA: Diagnosis not present

## 2020-07-21 DIAGNOSIS — I480 Paroxysmal atrial fibrillation: Secondary | ICD-10-CM | POA: Diagnosis present

## 2020-07-21 DIAGNOSIS — Z953 Presence of xenogenic heart valve: Secondary | ICD-10-CM | POA: Diagnosis not present

## 2020-07-21 DIAGNOSIS — E1151 Type 2 diabetes mellitus with diabetic peripheral angiopathy without gangrene: Secondary | ICD-10-CM | POA: Diagnosis present

## 2020-07-21 DIAGNOSIS — N184 Chronic kidney disease, stage 4 (severe): Secondary | ICD-10-CM | POA: Diagnosis present

## 2020-07-21 DIAGNOSIS — M79662 Pain in left lower leg: Secondary | ICD-10-CM | POA: Diagnosis not present

## 2020-07-21 DIAGNOSIS — L0390 Cellulitis, unspecified: Secondary | ICD-10-CM | POA: Diagnosis not present

## 2020-07-21 DIAGNOSIS — I130 Hypertensive heart and chronic kidney disease with heart failure and stage 1 through stage 4 chronic kidney disease, or unspecified chronic kidney disease: Secondary | ICD-10-CM | POA: Diagnosis present

## 2020-07-21 DIAGNOSIS — M810 Age-related osteoporosis without current pathological fracture: Secondary | ICD-10-CM | POA: Diagnosis present

## 2020-07-21 DIAGNOSIS — E1165 Type 2 diabetes mellitus with hyperglycemia: Secondary | ICD-10-CM | POA: Diagnosis present

## 2020-07-21 DIAGNOSIS — Z87442 Personal history of urinary calculi: Secondary | ICD-10-CM | POA: Diagnosis not present

## 2020-07-21 DIAGNOSIS — L03116 Cellulitis of left lower limb: Principal | ICD-10-CM

## 2020-07-21 DIAGNOSIS — K449 Diaphragmatic hernia without obstruction or gangrene: Secondary | ICD-10-CM | POA: Diagnosis present

## 2020-07-21 DIAGNOSIS — M79605 Pain in left leg: Secondary | ICD-10-CM

## 2020-07-21 DIAGNOSIS — M79672 Pain in left foot: Secondary | ICD-10-CM | POA: Diagnosis not present

## 2020-07-21 DIAGNOSIS — I69311 Memory deficit following cerebral infarction: Secondary | ICD-10-CM | POA: Diagnosis not present

## 2020-07-21 DIAGNOSIS — M7989 Other specified soft tissue disorders: Secondary | ICD-10-CM | POA: Diagnosis not present

## 2020-07-21 DIAGNOSIS — I2510 Atherosclerotic heart disease of native coronary artery without angina pectoris: Secondary | ICD-10-CM | POA: Diagnosis present

## 2020-07-21 DIAGNOSIS — Z833 Family history of diabetes mellitus: Secondary | ICD-10-CM | POA: Diagnosis not present

## 2020-07-21 DIAGNOSIS — E1122 Type 2 diabetes mellitus with diabetic chronic kidney disease: Secondary | ICD-10-CM | POA: Diagnosis present

## 2020-07-21 DIAGNOSIS — I739 Peripheral vascular disease, unspecified: Secondary | ICD-10-CM | POA: Diagnosis not present

## 2020-07-21 DIAGNOSIS — D631 Anemia in chronic kidney disease: Secondary | ICD-10-CM | POA: Diagnosis present

## 2020-07-21 DIAGNOSIS — G4730 Sleep apnea, unspecified: Secondary | ICD-10-CM | POA: Diagnosis present

## 2020-07-21 DIAGNOSIS — Z808 Family history of malignant neoplasm of other organs or systems: Secondary | ICD-10-CM | POA: Diagnosis not present

## 2020-07-21 DIAGNOSIS — Z89422 Acquired absence of other left toe(s): Secondary | ICD-10-CM | POA: Diagnosis not present

## 2020-07-21 DIAGNOSIS — E785 Hyperlipidemia, unspecified: Secondary | ICD-10-CM | POA: Diagnosis present

## 2020-07-21 DIAGNOSIS — I252 Old myocardial infarction: Secondary | ICD-10-CM | POA: Diagnosis not present

## 2020-07-21 DIAGNOSIS — Z8249 Family history of ischemic heart disease and other diseases of the circulatory system: Secondary | ICD-10-CM | POA: Diagnosis not present

## 2020-07-21 DIAGNOSIS — I5032 Chronic diastolic (congestive) heart failure: Secondary | ICD-10-CM | POA: Diagnosis present

## 2020-07-21 DIAGNOSIS — K219 Gastro-esophageal reflux disease without esophagitis: Secondary | ICD-10-CM | POA: Diagnosis present

## 2020-07-21 LAB — RESP PANEL BY RT-PCR (FLU A&B, COVID) ARPGX2
Influenza A by PCR: NEGATIVE
Influenza B by PCR: NEGATIVE
SARS Coronavirus 2 by RT PCR: NEGATIVE

## 2020-07-21 LAB — CBG MONITORING, ED
Glucose-Capillary: 130 mg/dL — ABNORMAL HIGH (ref 70–99)
Glucose-Capillary: 137 mg/dL — ABNORMAL HIGH (ref 70–99)
Glucose-Capillary: 208 mg/dL — ABNORMAL HIGH (ref 70–99)

## 2020-07-21 MED ORDER — AMIODARONE HCL 200 MG PO TABS
200.0000 mg | ORAL_TABLET | Freq: Every day | ORAL | Status: DC
  Administered 2020-07-21 – 2020-07-23 (×3): 200 mg via ORAL
  Filled 2020-07-21 (×3): qty 1

## 2020-07-21 MED ORDER — INSULIN ASPART 100 UNIT/ML ~~LOC~~ SOLN
0.0000 [IU] | Freq: Every day | SUBCUTANEOUS | Status: DC
  Administered 2020-07-21: 2 [IU] via SUBCUTANEOUS
  Administered 2020-07-22: 5 [IU] via SUBCUTANEOUS

## 2020-07-21 MED ORDER — LORATADINE 10 MG PO TABS
10.0000 mg | ORAL_TABLET | Freq: Every day | ORAL | Status: DC
  Administered 2020-07-21 – 2020-07-23 (×3): 10 mg via ORAL
  Filled 2020-07-21 (×3): qty 1

## 2020-07-21 MED ORDER — CEFAZOLIN SODIUM-DEXTROSE 1-4 GM/50ML-% IV SOLN
1.0000 g | Freq: Two times a day (BID) | INTRAVENOUS | Status: DC
  Administered 2020-07-22 – 2020-07-23 (×3): 1 g via INTRAVENOUS
  Filled 2020-07-21 (×3): qty 50

## 2020-07-21 MED ORDER — TORSEMIDE 20 MG PO TABS
40.0000 mg | ORAL_TABLET | Freq: Every day | ORAL | Status: DC
  Administered 2020-07-21 – 2020-07-23 (×3): 40 mg via ORAL
  Filled 2020-07-21 (×3): qty 2

## 2020-07-21 MED ORDER — BENZONATATE 100 MG PO CAPS: 100.0000 mg | ORAL_CAPSULE | Freq: Three times a day (TID) | ORAL | Status: DC | PRN

## 2020-07-21 MED ORDER — TAFAMIDIS 61 MG PO CAPS: 61.0000 mg | ORAL_CAPSULE | Freq: Every day | ORAL | Status: DC

## 2020-07-21 MED ORDER — ACETAMINOPHEN 650 MG RE SUPP: 650.0000 mg | Freq: Four times a day (QID) | RECTAL | Status: DC | PRN

## 2020-07-21 MED ORDER — OXYCODONE HCL 5 MG PO TABS
5.0000 mg | ORAL_TABLET | ORAL | Status: DC | PRN
  Administered 2020-07-21: 5 mg via ORAL
  Filled 2020-07-21 (×2): qty 1

## 2020-07-21 MED ORDER — PRAVASTATIN SODIUM 40 MG PO TABS
80.0000 mg | ORAL_TABLET | Freq: Every day | ORAL | Status: DC
  Administered 2020-07-21 – 2020-07-22 (×2): 80 mg via ORAL
  Filled 2020-07-21 (×2): qty 2

## 2020-07-21 MED ORDER — HYDRALAZINE HCL 50 MG PO TABS
50.0000 mg | ORAL_TABLET | Freq: Three times a day (TID) | ORAL | Status: DC
  Administered 2020-07-21 – 2020-07-23 (×5): 50 mg via ORAL
  Filled 2020-07-21 (×2): qty 1
  Filled 2020-07-21: qty 2
  Filled 2020-07-21 (×2): qty 1

## 2020-07-21 MED ORDER — DOCUSATE SODIUM 100 MG PO CAPS
100.0000 mg | ORAL_CAPSULE | Freq: Two times a day (BID) | ORAL | Status: DC
  Administered 2020-07-22 – 2020-07-23 (×3): 100 mg via ORAL
  Filled 2020-07-21 (×3): qty 1

## 2020-07-21 MED ORDER — MECLIZINE HCL 12.5 MG PO TABS
12.5000 mg | ORAL_TABLET | Freq: Three times a day (TID) | ORAL | Status: DC | PRN
  Administered 2020-07-23: 12.5 mg via ORAL
  Filled 2020-07-21 (×2): qty 1

## 2020-07-21 MED ORDER — SODIUM CHLORIDE 0.9 % IV SOLN
1.0000 g | Freq: Once | INTRAVENOUS | Status: AC
  Administered 2020-07-21: 1 g via INTRAVENOUS
  Filled 2020-07-21: qty 10

## 2020-07-21 MED ORDER — OMEGA-3-ACID ETHYL ESTERS 1 G PO CAPS
4.0000 g | ORAL_CAPSULE | Freq: Two times a day (BID) | ORAL | Status: DC
  Administered 2020-07-21 – 2020-07-23 (×3): 4 g via ORAL
  Filled 2020-07-21 (×7): qty 4

## 2020-07-21 MED ORDER — ICOSAPENT ETHYL 1 G PO CAPS: 4.0000 g | ORAL_CAPSULE | Freq: Two times a day (BID) | ORAL | Status: DC

## 2020-07-21 MED ORDER — INSULIN NPH (HUMAN) (ISOPHANE) 100 UNIT/ML ~~LOC~~ SUSP
25.0000 [IU] | Freq: Two times a day (BID) | SUBCUTANEOUS | Status: DC
  Administered 2020-07-21 – 2020-07-23 (×4): 25 [IU] via SUBCUTANEOUS
  Filled 2020-07-21 (×4): qty 10

## 2020-07-21 MED ORDER — SODIUM CHLORIDE 0.9 % IV SOLN: 1.0000 g | INTRAVENOUS | Status: DC

## 2020-07-21 MED ORDER — PANTOPRAZOLE SODIUM 40 MG PO TBEC
40.0000 mg | DELAYED_RELEASE_TABLET | Freq: Every day | ORAL | Status: DC
  Administered 2020-07-21 – 2020-07-23 (×3): 40 mg via ORAL
  Filled 2020-07-21 (×3): qty 1

## 2020-07-21 MED ORDER — FERROUS SULFATE 325 (65 FE) MG PO TABS
325.0000 mg | ORAL_TABLET | Freq: Two times a day (BID) | ORAL | Status: DC
  Administered 2020-07-21 – 2020-07-23 (×3): 325 mg via ORAL
  Filled 2020-07-21 (×4): qty 1

## 2020-07-21 MED ORDER — ACETAMINOPHEN 325 MG PO TABS
650.0000 mg | ORAL_TABLET | Freq: Four times a day (QID) | ORAL | Status: DC | PRN
  Administered 2020-07-22 – 2020-07-23 (×2): 650 mg via ORAL
  Filled 2020-07-21 (×2): qty 2

## 2020-07-21 MED ORDER — APIXABAN 5 MG PO TABS
5.0000 mg | ORAL_TABLET | Freq: Two times a day (BID) | ORAL | Status: DC
  Administered 2020-07-21 – 2020-07-23 (×4): 5 mg via ORAL
  Filled 2020-07-21 (×4): qty 1

## 2020-07-21 MED ORDER — INSULIN ASPART 100 UNIT/ML ~~LOC~~ SOLN
0.0000 [IU] | Freq: Three times a day (TID) | SUBCUTANEOUS | Status: DC
  Administered 2020-07-21: 3 [IU] via SUBCUTANEOUS
  Administered 2020-07-22: 4 [IU] via SUBCUTANEOUS
  Administered 2020-07-22: 11 [IU] via SUBCUTANEOUS
  Administered 2020-07-23: 4 [IU] via SUBCUTANEOUS
  Administered 2020-07-23: 7 [IU] via SUBCUTANEOUS

## 2020-07-21 NOTE — ED Notes (Signed)
Attempted two times for IV access. Unable to obtain IV access. IV team consult to be placed.

## 2020-07-21 NOTE — ED Notes (Signed)
IV team at bedside 

## 2020-07-21 NOTE — ED Notes (Signed)
Patient noted to have 02 sat of 88%, 2L [place] placed with O2 sat of 97%

## 2020-07-21 NOTE — H&P (Signed)
History and Physical    [name]:[id number] DOB: Mar 24, 1951 DOA: 07/20/2020  PCP: [name], DO (Confirm with patient/family/NH records and if not entered, this has to be entered at [facility] point of entry) Patient coming from: Home  I have personally briefly reviewed patient's old medical records in [place]  Chief Complaint: Left foot and leg rash and pain  HPI: [name] is a 69 y.o. female with medical history significant of aortic stenosis status post TAVR, chronic diastolic CHF, paroxysmal A. fib, CKD stage IV, IDDM with insulin resistance, PVD, morbid obesity, presented with worsening of left foot (history of transmetatarsal amputation) and shin area rash swelling and pain.  Patient started noticed above-mentioned changes 2 to 3 days ago, gradually getting worse, she denies any fever chills, no chest pain or shortness of breath.  She denied any any rash or pain on the right leg. ED Course: Patient was found to have acute cellulitis of left foot and shin area, WBC 11.  DVT study negative.  X-ray showed no signs of bony infection.  Review of Systems: As per HPI otherwise 10 point review of systems negative.    Past Medical History:  Diagnosis Date  . Atrial fibrillation ([place])    a. maintaining sinus s/p DCCV, on Eliquis  . CAD (coronary artery disease)    a. Nonobstructive by cath 2006 - 25% LAD, 25-30% after 1st diag, distal 25% PDA, minor irreg LCx. b. Normal nuc 2011.  Marland Kitchen CHF (congestive heart failure) ([place])   . CKD (chronic kidney disease), stage III ([place])   . Cystocele   . Difficult intubation 09-07-2012   big neck trouble with intubation parotid surgery"takes little med to sedate"  . Esophageal stricture   . GERD (gastroesophageal reflux disease)   . Hiatal hernia    a. 07/2013: HH with small stricture holding up barium tablet (concides with patient's sx of food sticking).  . History of kidney stones   . Hyperlipidemia   . Hypertension   .  IBS (irritable bowel syndrome)   . Memory difficulty 11/26/2015  . Morbid obesity ([place])    a. hx of gastric bypass (sleeve gastrectomy 2015)  . Non-STEMI (non-ST elevated myocardial infarction) ([place]) 01/16/2020  . Osteoporosis   . 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  Marland Kitchen Pericardial effusion    a. Mod by echo 2012 at time of PNA. b. Echo 07/2012: small pericardial effusion vs fat.  . Pressure ulcer 04/16/2019   right foot  . S/P TAVR (transcatheter aortic valve replacement)   . Severe aortic stenosis   . Sleep apnea     "have mask; don't use it" (02/14/2018)  . TIA (transient ischemic attack) 10/2014; 06/2016   "some memory issues since; daughter says my speech is sometimes different" (02/14/2018)  . Type II diabetes mellitus ([place])     Past Surgical History:  Procedure Laterality Date  . BREATH TEK H PYLORI N/A 07/29/2013   Procedure: [place];  Surgeon: Shann Medal, MD;  Location: [facility];  Service: General;  Laterality: N/A;  . CARDIAC CATHETERIZATION  2006  . CARDIOVERSION N/A 02/16/2018   Procedure: CARDIOVERSION;  Surgeon: Pixie Casino, MD;  Location: [facility] ENDOSCOPY;  Service: Cardiovascular;  Laterality: N/A;  . CARDIOVERSION N/A 02/25/2020   Procedure: CARDIOVERSION;  Surgeon: Larey Dresser, MD;  Location: [place];  Service: Cardiovascular;  Laterality: N/A;  . CARPAL TUNNEL RELEASE Left 2011  . CATARACT  EXTRACTION W/ INTRAOCULAR LENS  IMPLANT, BILATERAL Bilateral 2003   left  . CHOLECYSTECTOMY OPEN  1982  . COLONOSCOPY N/A 07/14/2015   Procedure: COLONOSCOPY;  Surgeon: Ladene Artist, MD;  Location: [facility] ENDOSCOPY;  Service: Endoscopy;  Laterality: N/A;  . ESOPHAGOGASTRODUODENOSCOPY N/A 12/27/2013   Procedure: ESOPHAGOGASTRODUODENOSCOPY (EGD);  Surgeon: Shann Medal, MD;  Location: [facility];  Service: General;  Laterality: N/A;  . ESOPHAGOGASTRODUODENOSCOPY (EGD) WITH ESOPHAGEAL DILATION    . INTRAOPERATIVE TRANSTHORACIC  ECHOCARDIOGRAM  05/08/2018   Procedure: INTRAOPERATIVE TRANSTHORACIC ECHOCARDIOGRAM;  Surgeon: Burnell Blanks, MD;  Location: [place];  Service: Open Heart Surgery;;  . IR FLUORO GUIDE CV LINE RIGHT  04/25/2019  . IR REMOVAL TUN CV CATH W/O FL  06/20/2019  . IR US GUIDE VASC ACCESS RIGHT  04/25/2019  . LAPAROSCOPIC GASTRIC SLEEVE RESECTION N/A 01/20/2014   Procedure: LAPAROSCOPIC GASTRIC SLEEVE RESECTION;  Surgeon: Shann Medal, MD;  Location: [facility] ORS;  Service: General;  Laterality: N/A;  . PAROTIDECTOMY  09/07/2012   Procedure: PAROTIDECTOMY;  Surgeon: Melida Quitter, MD;  Location: [place];  Service: ENT;  Laterality: Left;  LEFT PAROTIDECTOMY  . RIGHT HEART CATH N/A 09/25/2018   Procedure: RIGHT HEART CATH;  Surgeon: Larey Dresser, MD;  Location: [place] CV LAB;  Service: Cardiovascular;  Laterality: N/A;  . RIGHT HEART CATH N/A 07/06/2020   Procedure: RIGHT HEART CATH;  Surgeon: Larey Dresser, MD;  Location: [place] CV LAB;  Service: Cardiovascular;  Laterality: N/A;  . RIGHT/LEFT HEART CATH AND CORONARY ANGIOGRAPHY N/A 03/29/2018   Procedure: RIGHT/LEFT HEART CATH AND CORONARY ANGIOGRAPHY;  Surgeon: Burnell Blanks, MD;  Location: [place] CV LAB;  Service: Cardiovascular;  Laterality: N/A;  . TEE WITHOUT CARDIOVERSION N/A 02/16/2018   Procedure: TRANSESOPHAGEAL ECHOCARDIOGRAM (TEE);  Surgeon: Pixie Casino, MD;  Location: [place];  Service: Cardiovascular;  Laterality: N/A;  . TEE WITHOUT CARDIOVERSION N/A 06/05/2019   Procedure: TRANSESOPHAGEAL ECHOCARDIOGRAM (TEE);  Surgeon: Sanda Klein, MD;  Location: [place];  Service: Cardiovascular;  Laterality: N/A;  . TEE WITHOUT CARDIOVERSION N/A 04/24/2019   Procedure: TRANSESOPHAGEAL ECHOCARDIOGRAM (TEE);  Surgeon: Sanda Klein, MD;  Location: [place];  Service: Cardiovascular;  Laterality: N/A;  . TEE WITHOUT CARDIOVERSION N/A 02/25/2020   Procedure: TRANSESOPHAGEAL ECHOCARDIOGRAM (TEE);  Surgeon:  Larey Dresser, MD;  Location: [facility] ENDOSCOPY;  Service: Cardiovascular;  Laterality: N/A;  . TEE WITHOUT CARDIOVERSION N/A 07/08/2020   Procedure: TRANSESOPHAGEAL ECHOCARDIOGRAM (TEE);  Surgeon: Larey Dresser, MD;  Location: [facility] ENDOSCOPY;  Service: Cardiovascular;  Laterality: N/A;  . TOE AMPUTATION Left 07/2017   great toe; [facility]   . TONSILLECTOMY  1968  . TRANSCATHETER AORTIC VALVE REPLACEMENT, TRANSFEMORAL  05/08/2018   Transcatheter Aortic Valve Replacement   . TRANSCATHETER AORTIC VALVE REPLACEMENT, TRANSFEMORAL N/A 05/08/2018   Procedure: TRANSCATHETER AORTIC VALVE REPLACEMENT, TRANSFEMORAL;  Surgeon: Burnell Blanks, MD;  Location: [place];  Service: Open Heart Surgery;  Laterality: N/A;  . TUBAL LIGATION  yrs ago  . UPPER GI ENDOSCOPY  01/20/2014   Procedure: UPPER GI ENDOSCOPY;  Surgeon: Shann Medal, MD;  Location: [facility];  Service: General;;     reports that she has never smoked. She has never used smokeless tobacco. She reports previous alcohol use. She reports that she does not use drugs.  Allergies  Allergen Reactions  . Amlodipine Swelling    Ankle swelling  . Atorvastatin Other (See Comments)    Myalgias   . Crestor [Rosuvastatin Calcium] Other (  See Comments)    Stiffness and back pain  . Ezetimibe-Simvastatin Other (See Comments)    Leg cramps  . Sitagliptin Other (See Comments)  . Statins Other (See Comments)    Myalgias  . Celebrex [Celecoxib] Rash  . Levemir [Insulin Detemir] Rash    Family History  Problem Relation Age of Onset  . Heart failure Mother   . Hypertension Mother   . Skin cancer Mother   . Colitis Mother   . Diabetes Mother   . Kidney Stones Mother   . Stroke Mother   . Heart disease Father   . Colon polyps Father   . Diabetes Father   . Heart failure Father   . Heart attack Sister   . Stroke Maternal Grandfather   . Diabetes Maternal Grandfather   . Diabetes Daughter        prediabetes  . Colon cancer Neg Hx       Prior to Admission medications   Medication Sig Start Date End Date Taking? Authorizing Provider  ACCU-CHEK AVIVA PLUS test strip USE TO CHECK BLOOD SUGAR UP TO TWICE DAILY OR AS INSTRUCTED Patient taking differently: 1 each by Other route 2 (two) times daily.  09/04/17   Chipper Herb, MD  Alirocumab (PRALUENT) 75 MG/ML SOAJ Inject 75 mg into the skin every 14 (fourteen) days. 07/13/20   Larey Dresser, MD  amiodarone (PACERONE) 200 MG tablet Take 1 tablet 2 times a day for 7 days, then reduce down to 1 tablet once daily 07/13/20   Lyda Jester M, PA-C  B-D INS SYR ULTRAFINE 1CC/31G 31G X 5/16" 1 ML MISC USE TO INJECT INSULIN TWICE A DAY AS INSTRUCTED Patient taking differently: 1 Stick by Other route 3 (three) times daily before meals.  11/16/15   Chipper Herb, MD  benzonatate (TESSALON PERLES) 100 MG capsule Take 1 capsule (100 mg total) by mouth 3 (three) times daily as needed. 06/10/20   [name], DO  Calcium Carbonate (CALCIUM 600 PO) Take 1 tablet by mouth daily.    [provider]  Cholecalciferol (VITAMIN D3 PO) Take 1 tablet by mouth daily.     [provider]  docusate sodium (COLACE) 50 MG capsule Take 2 capsules (100 mg total) by mouth 2 (two) times daily. Patient taking differently: Take 100 mg by mouth daily.  03/23/20   Larey Dresser, MD  ELIQUIS 5 MG TABS tablet TAKE 1 TABLET BY MOUTH TWICE A DAY Patient taking differently: Take 5 mg by mouth 2 (two) times daily.  02/03/20   Minus Breeding, MD  ferrous sulfate (FERROUSUL) 325 (65 FE) MG tablet Take 1 tablet (325 mg total) by mouth 2 (two) times daily with a meal. 06/10/20   Cirigliano, Vito V, DO  hydrALAZINE (APRESOLINE) 50 MG tablet Take 50 mg by mouth 3 (three) times daily.    [provider]  insulin NPH Human (NOVOLIN N) 100 UNIT/ML injection Inject 0.25 mLs (25 Units total) into the skin 2 (two) times daily at 8 am and 10 pm. 07/13/20   Lyda Jester M, PA-C  Insulin Pen  Needle 32G X 4 MM MISC Use to inject insulin with insulin pen 05/17/16   Chipper Herb, MD  loratadine (CLARITIN) 10 MG tablet Take 10 mg by mouth daily.     [provider]  meclizine (ANTIVERT) 12.5 MG tablet Take 1 tablet (12.5 mg total) by mouth 3 (three) times daily as needed for dizziness. 06/14/19   Aundra Dubin,  Elby Showers, MD  Multiple Vitamins-Minerals (MULTIVITAMIN GUMMIES ADULT PO) Taking two gummies by mouth daily    [provider]  NOVOLIN R RELION 100 UNIT/ML injection Inject 20-30 Units into the skin 3 (three) times daily before meals. Per sliding scale 02/07/20   [provider]  nutrition supplement, JUVEN, (JUVEN) PACK Take 1 packet by mouth 2 (two) times daily between meals. 04/26/19   Swayze, Ava, DO  pantoprazole (PROTONIX) 40 MG tablet Take 1 tablet (40 mg total) by mouth daily. 05/19/20   Larey Dresser, MD  Pitavastatin Calcium (LIVALO) 4 MG TABS Take 4 mg by mouth daily.     [provider]  RELION INSULIN SYRINGE 31G X 15/64" 1 ML MISC Inject 1 Syringe into the skin 3 (three) times daily. 01/04/20   [provider]  Tafamidis (VYNDAMAX) 61 MG CAPS Take 61 mg by mouth daily. 07/29/19   Larey Dresser, MD  torsemide (DEMADEX) 20 MG tablet Take 40 mg (2 tablets) every morning and 20 mg (1 tablet) every evening 07/13/20   Simmons, Brittainy M, PA-C  VASCEPA 1 g capsule TAKE 2 CAPSULES (2 G TOTAL) BY MOUTH 2 (TWO) TIMES A DAY. Patient taking differently: Take 4 g by mouth 2 (two) times daily.  01/16/20   Larey Dresser, MD  Olmesartan-Amlodipine-HCTZ [facility]) 40-5-12.5 MG TABS Take 1 tablet by mouth daily.    04/12/18  [provider]    Physical Exam: Vitals:   07/21/20 0220 07/21/20 0632 07/21/20 0859 07/21/20 1045  BP: (!) 143/58 (!) 120/94 (!) 133/50 (!) 143/37  Pulse: 79 (!) 103 85 70  Resp: 20 17 18 20   Temp: (!) 97.5 F (36.4 C) 97.9 F (36.6 C)    TempSrc:      SpO2: 99% 100% 98% 92%    Constitutional: NAD,  calm, comfortable Vitals:   07/21/20 0220 07/21/20 0632 07/21/20 0859 07/21/20 1045  BP: (!) 143/58 (!) 120/94 (!) 133/50 (!) 143/37  Pulse: 79 (!) 103 85 70  Resp: 20 17 18 20   Temp: (!) 97.5 F (36.4 C) 97.9 F (36.6 C)    TempSrc:      SpO2: 99% 100% 98% 92%   Eyes: PERRL, lids and conjunctivae normal ENMT: Mucous membranes are moist. Posterior pharynx clear of any exudate or lesions.Normal dentition.  Neck: normal, supple, no masses, no thyromegaly Respiratory: clear to auscultation bilaterally, no wheezing, no crackles. Normal respiratory effort. No accessory muscle use.  Cardiovascular: Regular rate and rhythm, no murmurs / rubs / gallops.  2+ pitting edema up to the left shin area versus no edema on the right side shin. 2+ pedal pulses. No carotid bruits.  Abdomen: no tenderness, no masses palpated. No hepatosplenomegaly. Bowel sounds positive.  Musculoskeletal: no clubbing / cyanosis. No joint deformity upper and lower extremities. Good ROM, no contractures. Normal muscle tone.  Skin: Diffuse rash on left lower extremity below the knee, estimate more than one third surface area to the left leg, tenderness and warmth to touch, no crepitus, no pus. Neurologic: CN 2-12 grossly intact. Sensation intact, DTR normal. Strength 5/5 in all 4.  Psychiatric: Normal judgment and insight. Alert and oriented x 3. Normal mood.     Labs on Admission: I have personally reviewed following labs and imaging studies  CBC: Recent Labs  Lab 07/20/20 1545  WBC 11.7*  NEUTROABS 10.0*  HGB 10.8*  HCT 38.1  MCV 85.6  PLT 166   Basic Metabolic Panel: Recent Labs  Lab 07/20/20  1545  NA 138  K 3.9  CL 95*  CO2 31  GLUCOSE 180*  BUN 47*  CREATININE 2.42*  CALCIUM 9.0   GFR: Estimated Creatinine Clearance: 24.2 mL/min (A) (by C-G formula based on SCr of 2.42 mg/dL (H)). Liver Function Tests: Recent Labs  Lab 07/20/20 1545  AST 50*  ALT 51*  ALKPHOS 64  BILITOT 1.6*  PROT 6.8   ALBUMIN 2.8*   No results for input(s): LIPASE, AMYLASE in the last 168 hours. No results for input(s): AMMONIA in the last 168 hours. Coagulation Profile: Recent Labs  Lab 07/20/20 1545  INR 1.9*   Cardiac Enzymes: No results for input(s): CKTOTAL, CKMB, CKMBINDEX, TROPONINI in the last 168 hours. BNP (last 3 results) No results for input(s): PROBNP in the last 8760 hours. HbA1C: No results for input(s): HGBA1C in the last 72 hours. CBG: Recent Labs  Lab 07/20/20 1748 07/21/20 0303  GLUCAP 144* 130*   Lipid Profile: No results for input(s): CHOL, HDL, LDLCALC, TRIG, CHOLHDL, LDLDIRECT in the last 72 hours. Thyroid Function Tests: No results for input(s): TSH, T4TOTAL, FREET4, T3FREE, THYROIDAB in the last 72 hours. Anemia Panel: No results for input(s): VITAMINB12, FOLATE, FERRITIN, TIBC, IRON, RETICCTPCT in the last 72 hours. Urine analysis:    Component Value Date/Time   COLORURINE YELLOW 02/19/2020 1822   APPEARANCEUR HAZY (A) 02/19/2020 1822   APPEARANCEUR Clear 06/19/2018 1637   LABSPEC 1.011 02/19/2020 1822   PHURINE 5.0 02/19/2020 1822   GLUCOSEU >=500 (A) 02/19/2020 1822   HGBUR SMALL (A) 02/19/2020 1822   BILIRUBINUR NEGATIVE 02/19/2020 1822   BILIRUBINUR Negative 06/19/2018 [place] 02/19/2020 1822   PROTEINUR NEGATIVE 02/19/2020 1822   UROBILINOGEN 1.0 11/07/2014 2242   NITRITE POSITIVE (A) 02/19/2020 1822   LEUKOCYTESUR MODERATE (A) 02/19/2020 1822    Radiological Exams on Admission: DG Chest 2 View  Result Date: 07/20/2020 CLINICAL DATA:  Shortness of breath. Left leg pain and swelling. Chronic swelling of both lower extremities. Diabetic. EXAM: CHEST - 2 VIEW COMPARISON:  None. FINDINGS: Redemonstration of an enlarged cardiac silhouette. Otherwise the heart size and mediastinal contours are unchanged. Aortic arch calcifications. No focal consolidation. No pulmonary edema. No pleural effusion. No pneumothorax. No acute osseous  abnormality.  Degenerative changes of the spine. IMPRESSION: Similar-appearing enlarged cardiac silhouette with otherwise no acute cardiopulmonary disease. Electronically Signed   By: Iven Finn M.D.   On: 07/20/2020 20:00   DG Tibia/Fibula Left  Result Date: 07/21/2020 CLINICAL DATA:  Leg swelling, pain EXAM: LEFT TIBIA AND FIBULA - 2 VIEW COMPARISON:  Knee series 03/06/2019 FINDINGS: No acute bony abnormality. Specifically, no fracture, subluxation, or dislocation. Soft tissues are intact. Vascular calcifications noted. IMPRESSION: No acute bony abnormality. Electronically Signed   By: Rolm Baptise M.D.   On: 07/21/2020 11:25   DG Foot Complete Left  Result Date: 07/21/2020 CLINICAL DATA:  Leg swelling, pain EXAM: LEFT FOOT - COMPLETE 3+ VIEW COMPARISON:  09/26/2012 FINDINGS: Prior transmetatarsal amputations of all the toes within the left foot. No acute bony abnormality. Specifically, no fracture, subluxation, or dislocation. Soft tissues are intact. IMPRESSION: Prior transmetatarsal amputation.  No acute bony abnormality. Electronically Signed   By: Rolm Baptise M.D.   On: 07/21/2020 11:25   VAS [place] LOWER EXTREMITY VENOUS (DVT) (ONLY [facility] & [facility])  Result Date: 07/21/2020  Lower Venous DVT Study Indications: Pain.  Limitations: Body habitus, poor ultrasound/tissue interface, open wound, musculoskeletal features and poor patient cooperation, patient unable to tolerate  some of exam. Comparison Study: 06/18/2020- negative left lower extremity venous duplex Performing Technologist: Maudry Mayhew MHA, RDMS, RVT, RDCS  Examination Guidelines: A complete evaluation includes B-mode imaging, spectral Doppler, color Doppler, and power Doppler as needed of all accessible portions of each vessel. Bilateral testing is considered an integral part of a complete examination. Limited examinations for reoccurring indications may be performed as noted. The reflux portion of the exam is performed with the patient in  reverse Trendelenburg.  Right Technical Findings: Not visualized segments include CFV.  +---------+---------------+---------+-----------+----------+--------------+ LEFT     CompressibilityPhasicitySpontaneityPropertiesThrombus Aging +---------+---------------+---------+-----------+----------+--------------+ CFV      Full           Yes      Yes                                 +---------+---------------+---------+-----------+----------+--------------+ FV Prox  Full                                                        +---------+---------------+---------+-----------+----------+--------------+ FV Mid   Full                                                        +---------+---------------+---------+-----------+----------+--------------+ FV DistalFull                                                        +---------+---------------+---------+-----------+----------+--------------+ PFV      Full                                                        +---------+---------------+---------+-----------+----------+--------------+ POP      Full           Yes      Yes                                 +---------+---------------+---------+-----------+----------+--------------+   Left Technical Findings: Not visualized segments include SFJ, PTV, peroneal veins.   Summary: LEFT: - There is no evidence of deep vein thrombosis in the lower extremity. However, portions of this examination were limited- see technologist comments above.  - No cystic structure found in the popliteal fossa.  *See table(s) above for measurements and observations.    Preliminary     EKG: None  Assessment/Plan Active Problems:   Cellulitis  (please populate well all problems here in Problem List. (For example, if patient is on BP meds at home and you resume or decide to hold them, it is a problem that needs to be her. Same for CAD, COPD, HLD and so on)  Left leg cellulitis -Estimate involvement more  than 1/3 of surface area of the left leg, moderate severity of the cellulitis, agreed  with IV antibiotics.  No signs of MRSA type infection, will send MRSA screening, agreed with continue ceftriaxone for now.  Also send ASO  Acute on chronic ambulation dysfunction -Secondary to cellulitis -PT evaluation  PVD -Arterial Doppler was done on last admission two weeks ago showing distal popliteal and left-sided occlusion, according to care everywhere record, patient has been following with [facility] vascular surgery and patient underwent left distal SFA-TP trunk bypass graft with CryoVein in January 2021, and TMA in February 2021 also at [place]. ABI on left =1.1 in September 2021 -Outpatient vascular F/U  History of chronic diastolic CHF -No symptoms signs of acute decompensation, continue home dose of diuresis and BP meds.  CKD stage IV -Euvolemic, left leg swelling likely related to cellulitis.  IDDM with hyperglycemia -Continue long-acting insulin and sliding scale, it appears that she has insulin resistance, recommend she follow-up with outpatient endocrinologist to discuss SGLT2 inhibitor.  PAF -Controlled, on maximum dose of amiodarone, continue Eliquis.  DVT prophylaxis: Eliquis Code Status: Full code Family Communication: None at bedside Disposition Plan: Moderate cellulitis of left leg with underlying PVD, expect more than 2 midnight hospital stay to treat with IV antibiotics and PT evaluation for correlated ambulation dysfunction. Consults called: None Admission status: MedSurg admission   Lequita Halt MD [facility] Pager [phone]  07/21/2020, 12:55 PM

## 2020-07-21 NOTE — ED Provider Notes (Signed)
[facility] EMERGENCY DEPARTMENT Provider Note   CSN: [id number] Arrival date & time: 07/20/20  1532     History Chief Complaint  Patient presents with  . Leg Pain    [name] is a 69 y.o. female.  HPI   Patient has history of multiple medical problems including heart disease, CHF, chronic kidney disease, diabetes and peripheral vascular disease.  Patient states that previously she was diagnosed with cellulitis of her lower extremity.  She also has history of peripheral vascular disease and has had amputations of the toes in her left foot.  Patient has some chronic wounds in her left lower extremity.  Patient states in the last couple of days she started noticed increasing swelling and redness.  She is also having some increasing pain and discomfort.  Patient came to the ED yesterday evening for this complaint.  She has been waiting overnight in the waiting room.  Patient denies any fevers.  She has some chronic complaints with her breathing but no acute changes.  Patient was last admitted to the hospital the end of November.  During that hospitalization she was treated for cellulitis.  Patient had vascular studies that showed occlusion of popliteal as well as distal posterior tibial and anterior tibial arteries.  Patient was not felt to have acute ischemia and recommendation was to follow-up with her vascular surgeons at [facility].  Past Medical History:  Diagnosis Date  . Atrial fibrillation ([place])    a. maintaining sinus s/p DCCV, on Eliquis  . CAD (coronary artery disease)    a. Nonobstructive by cath 2006 - 25% LAD, 25-30% after 1st diag, distal 25% PDA, minor irreg LCx. b. Normal nuc 2011.  Marland Kitchen CHF (congestive heart failure) ([place])   . CKD (chronic kidney disease), stage III ([place])   . Cystocele   . Difficult intubation 09-07-2012   big neck trouble with intubation parotid surgery"takes little med to sedate"  . Esophageal stricture   . GERD (gastroesophageal  reflux disease)   . Hiatal hernia    a. 07/2013: HH with small stricture holding up barium tablet (concides with patient's sx of food sticking).  . History of kidney stones   . Hyperlipidemia   . Hypertension   . IBS (irritable bowel syndrome)   . Memory difficulty 11/26/2015  . Morbid obesity ([place])    a. hx of gastric bypass (sleeve gastrectomy 2015)  . Non-STEMI (non-ST elevated myocardial infarction) ([place]) 01/16/2020  . Osteoporosis   . 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  Marland Kitchen Pericardial effusion    a. Mod by echo 2012 at time of PNA. b. Echo 07/2012: small pericardial effusion vs fat.  . Pressure ulcer 04/16/2019   right foot  . S/P TAVR (transcatheter aortic valve replacement)   . Severe aortic stenosis   . Sleep apnea     "have mask; don't use it" (02/14/2018)  . TIA (transient ischemic attack) 10/2014; 06/2016   "some memory issues since; daughter says my speech is sometimes different" (02/14/2018)  . Type II diabetes mellitus [facility])     Patient Active Problem List   Diagnosis Date Noted  . Cellulitis of left lower leg 07/02/2020  . CKD stage 4 due to type 2 diabetes mellitus ([place]) 02/27/2020  . Essential hypertension 02/26/2020  . Class 3 obesity 02/26/2020  . Bradycardia 02/19/2020  . Hyperglycemia   . Hospital discharge follow-up 01/24/2020  . Non-STEMI (non-ST elevated myocardial infarction) ([place]) 01/16/2020  . NSTEMI (  non-ST elevated myocardial infarction) ([place]) 01/16/2020  . Acute on chronic diastolic heart failure ([place]) 12/09/2019  . S/P transmetatarsal amputation of foot, left ([place]) 10/23/2019  . Subacute bacterial endocarditis 05/21/2019  . 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 Bacteremia due to Enterococcus   . Pressure injury of skin 04/16/2019  . Infected wound 04/16/2019  . Weakness generalized 04/15/2019  . SIRS (systemic inflammatory response syndrome) ([place]) 04/15/2019  . Anasarca 09/19/2018  . Acute encephalopathy 06/16/2018  .  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 Acute on chronic diastolic CHF (congestive heart failure) ([place]) 05/10/2018  . Morbid obesity ([place])   . Hypertension associated with diabetes ([place])   . Chronic kidney disease   . Sleep apnea   . Hyperlipidemia   . GERD (gastroesophageal reflux disease)   . Persistent atrial fibrillation with rapid ventricular response ([place])   . S/P TAVR (transcatheter aortic valve replacement)   . Severe aortic stenosis   . Vertigo 05/06/2015  . TIA (transient ischemic attack), history of 11/07/2014  . 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 CKD stage 3 due to type 2 diabetes mellitus ([place]) 06/03/2014  . Type II diabetes mellitus with renal manifestations ([place]) 06/03/2014  . Type II diabetes mellitus with peripheral circulatory disorder ([place]) 06/03/2014  . Hyperlipidemia associated with type 2 diabetes mellitus ([place]) 12/26/2008  . Benign essential HTN 12/26/2008  . Coronary artery disease, non-occlusive 12/26/2008  . Pericardial effusion 12/26/2008    Past Surgical History:  Procedure Laterality Date  . BREATH TEK H PYLORI N/A 07/29/2013   Procedure: [place];  Surgeon: Shann Medal, MD;  Location: [facility];  Service: General;  Laterality: N/A;  . CARDIAC CATHETERIZATION  2006  . CARDIOVERSION N/A 02/16/2018   Procedure: CARDIOVERSION;  Surgeon: Pixie Casino, MD;  Location: [facility] ENDOSCOPY;  Service: Cardiovascular;  Laterality: N/A;  . CARDIOVERSION N/A 02/25/2020   Procedure: CARDIOVERSION;  Surgeon: Larey Dresser, MD;  Location: [facility];  Service: Cardiovascular;  Laterality: N/A;  . CARPAL TUNNEL RELEASE Left 2011  . CATARACT EXTRACTION W/ INTRAOCULAR LENS  IMPLANT, BILATERAL Bilateral 2003   left  . CHOLECYSTECTOMY OPEN  1982  . COLONOSCOPY N/A 07/14/2015   Procedure: COLONOSCOPY;  Surgeon: Ladene Artist, MD;  Location: [facility] ENDOSCOPY;  Service: Endoscopy;  Laterality: N/A;  . ESOPHAGOGASTRODUODENOSCOPY N/A 12/27/2013   Procedure:  ESOPHAGOGASTRODUODENOSCOPY (EGD);  Surgeon: Shann Medal, MD;  Location: [facility];  Service: General;  Laterality: N/A;  . ESOPHAGOGASTRODUODENOSCOPY (EGD) WITH ESOPHAGEAL DILATION    . INTRAOPERATIVE TRANSTHORACIC ECHOCARDIOGRAM  05/08/2018   Procedure: INTRAOPERATIVE TRANSTHORACIC ECHOCARDIOGRAM;  Surgeon: Burnell Blanks, MD;  Location: [facility];  Service: Open Heart Surgery;;  . IR FLUORO GUIDE CV LINE RIGHT  04/25/2019  . IR REMOVAL TUN CV CATH W/O FL  06/20/2019  . IR US GUIDE VASC ACCESS RIGHT  04/25/2019  . LAPAROSCOPIC GASTRIC SLEEVE RESECTION N/A 01/20/2014   Procedure: LAPAROSCOPIC GASTRIC SLEEVE RESECTION;  Surgeon: Shann Medal, MD;  Location: [facility]S;  Service: General;  Laterality: N/A;  . PAROTIDECTOMY  09/07/2012   Procedure: PAROTIDECTOMY;  Surgeon: Melida Quitter, MD;  Location: [facility];  Service: ENT;  Laterality: Left;  LEFT PAROTIDECTOMY  . RIGHT HEART CATH N/A 09/25/2018   Procedure: RIGHT HEART CATH;  Surgeon: Larey Dresser, MD;  Location: [facility] CV LAB;  Service: Cardiovascular;  Laterality: N/A;  . RIGHT HEART CATH N/A 07/06/2020   Procedure: RIGHT HEART CATH;  Surgeon: Larey Dresser, MD;  Location: [facility] CV LAB;  Service: Cardiovascular;  Laterality: N/A;  . RIGHT/LEFT HEART CATH AND CORONARY ANGIOGRAPHY N/A 03/29/2018   Procedure: RIGHT/LEFT HEART CATH AND CORONARY ANGIOGRAPHY;  Surgeon: Burnell Blanks, MD;  Location: [facility] CV LAB;  Service: Cardiovascular;  Laterality: N/A;  . TEE WITHOUT CARDIOVERSION N/A 02/16/2018   Procedure: TRANSESOPHAGEAL ECHOCARDIOGRAM (TEE);  Surgeon: Pixie Casino, MD;  Location: [facility];  Service: Cardiovascular;  Laterality: N/A;  . TEE WITHOUT CARDIOVERSION N/A 06/05/2019   Procedure: TRANSESOPHAGEAL ECHOCARDIOGRAM (TEE);  Surgeon: Sanda Klein, MD;  Location: [facility];  Service: Cardiovascular;  Laterality: N/A;  . TEE WITHOUT CARDIOVERSION N/A 04/24/2019   Procedure: TRANSESOPHAGEAL  ECHOCARDIOGRAM (TEE);  Surgeon: Sanda Klein, MD;  Location: [facility];  Service: Cardiovascular;  Laterality: N/A;  . TEE WITHOUT CARDIOVERSION N/A 02/25/2020   Procedure: TRANSESOPHAGEAL ECHOCARDIOGRAM (TEE);  Surgeon: Larey Dresser, MD;  Location: [facility] ENDOSCOPY;  Service: Cardiovascular;  Laterality: N/A;  . TEE WITHOUT CARDIOVERSION N/A 07/08/2020   Procedure: TRANSESOPHAGEAL ECHOCARDIOGRAM (TEE);  Surgeon: Larey Dresser, MD;  Location: [facility] ENDOSCOPY;  Service: Cardiovascular;  Laterality: N/A;  . TOE AMPUTATION Left 07/2017   great toe; [facility]   . TONSILLECTOMY  1968  . TRANSCATHETER AORTIC VALVE REPLACEMENT, TRANSFEMORAL  05/08/2018   Transcatheter Aortic Valve Replacement   . TRANSCATHETER AORTIC VALVE REPLACEMENT, TRANSFEMORAL N/A 05/08/2018   Procedure: TRANSCATHETER AORTIC VALVE REPLACEMENT, TRANSFEMORAL;  Surgeon: Burnell Blanks, MD;  Location: [facility];  Service: Open Heart Surgery;  Laterality: N/A;  . TUBAL LIGATION  yrs ago  . UPPER GI ENDOSCOPY  01/20/2014   Procedure: UPPER GI ENDOSCOPY;  Surgeon: Shann Medal, MD;  Location: [facility]S;  Service: General;;     OB History    Gravida  3   Para  3   Term  3   Preterm      AB      Living  3     SAB      TAB      Ectopic      Multiple      Live Births  3           Family History  Problem Relation Age of Onset  . Heart failure Mother   . Hypertension Mother   . Skin cancer Mother   . Colitis Mother   . Diabetes Mother   . Kidney Stones Mother   . Stroke Mother   . Heart disease Father   . Colon polyps Father   . Diabetes Father   . Heart failure Father   . Heart attack Sister   . Stroke Maternal Grandfather   . Diabetes Maternal Grandfather   . Diabetes Daughter        prediabetes  . Colon cancer Neg Hx     Social History   Tobacco Use  . Smoking status: Never Smoker  . Smokeless tobacco: Never Used  Vaping Use  . Vaping Use: Never used  Substance Use Topics  . Alcohol  use: Not Currently    Alcohol/week: 0.0 standard drinks    Comment:  "mixed drink q few years"  . Drug use: Never    Home Medications Prior to Admission medications   Medication Sig Start Date End Date Taking? Authorizing Provider  ACCU-CHEK AVIVA PLUS test strip USE TO CHECK BLOOD SUGAR UP TO TWICE DAILY OR AS INSTRUCTED Patient taking differently: 1 each by Other route 2 (two) times daily.  09/04/17   Chipper Herb,  MD  Alirocumab (PRALUENT) 75 MG/ML SOAJ Inject 75 mg into the skin every 14 (fourteen) days. 07/13/20   Larey Dresser, MD  amiodarone (PACERONE) 200 MG tablet Take 1 tablet 2 times a day for 7 days, then reduce down to 1 tablet once daily 07/13/20   Lyda Jester M, PA-C  B-D INS SYR ULTRAFINE 1CC/31G 31G X 5/16" 1 ML MISC USE TO INJECT INSULIN TWICE A DAY AS INSTRUCTED Patient taking differently: 1 Stick by Other route 3 (three) times daily before meals.  11/16/15   Chipper Herb, MD  benzonatate (TESSALON PERLES) 100 MG capsule Take 1 capsule (100 mg total) by mouth 3 (three) times daily as needed. 06/10/20   [name], DO  Calcium Carbonate (CALCIUM 600 PO) Take 1 tablet by mouth daily.    [provider]  Cholecalciferol (VITAMIN D3 PO) Take 1 tablet by mouth daily.     [provider]  docusate sodium (COLACE) 50 MG capsule Take 2 capsules (100 mg total) by mouth 2 (two) times daily. Patient taking differently: Take 100 mg by mouth daily.  03/23/20   Larey Dresser, MD  ELIQUIS 5 MG TABS tablet TAKE 1 TABLET BY MOUTH TWICE A DAY Patient taking differently: Take 5 mg by mouth 2 (two) times daily.  02/03/20   Minus Breeding, MD  ferrous sulfate (FERROUSUL) 325 (65 FE) MG tablet Take 1 tablet (325 mg total) by mouth 2 (two) times daily with a meal. 06/10/20   Cirigliano, Vito V, DO  hydrALAZINE (APRESOLINE) 50 MG tablet Take 50 mg by mouth 3 (three) times daily.    [provider]  insulin NPH Human (NOVOLIN N) 100 UNIT/ML  injection Inject 0.25 mLs (25 Units total) into the skin 2 (two) times daily at 8 am and 10 pm. 07/13/20   Lyda Jester M, PA-C  Insulin Pen Needle 32G X 4 MM MISC Use to inject insulin with insulin pen 05/17/16   Chipper Herb, MD  loratadine (CLARITIN) 10 MG tablet Take 10 mg by mouth daily.     [provider]  meclizine (ANTIVERT) 12.5 MG tablet Take 1 tablet (12.5 mg total) by mouth 3 (three) times daily as needed for dizziness. 06/14/19   Larey Dresser, MD  Multiple Vitamins-Minerals (MULTIVITAMIN GUMMIES ADULT PO) Taking two gummies by mouth daily    [provider]  NOVOLIN R RELION 100 UNIT/ML injection Inject 20-30 Units into the skin 3 (three) times daily before meals. Per sliding scale 02/07/20   [provider]  nutrition supplement, JUVEN, (JUVEN) PACK Take 1 packet by mouth 2 (two) times daily between meals. 04/26/19   Swayze, Ava, DO  pantoprazole (PROTONIX) 40 MG tablet Take 1 tablet (40 mg total) by mouth daily. 05/19/20   Larey Dresser, MD  Pitavastatin Calcium (LIVALO) 4 MG TABS Take 4 mg by mouth daily.     [provider]  RELION INSULIN SYRINGE 31G X 15/64" 1 ML MISC Inject 1 Syringe into the skin 3 (three) times daily. 01/04/20   [provider]  Tafamidis (VYNDAMAX) 61 MG CAPS Take 61 mg by mouth daily. 07/29/19   Larey Dresser, MD  torsemide (DEMADEX) 20 MG tablet Take 40 mg (2 tablets) every morning and 20 mg (1 tablet) every evening 07/13/20   Simmons, Brittainy M, PA-C  VASCEPA 1 g capsule TAKE 2 CAPSULES (2 G TOTAL) BY MOUTH 2 (TWO) TIMES A DAY. Patient taking differently: Take 4 g by mouth  2 (two) times daily.  01/16/20   Larey Dresser, MD  Olmesartan-Amlodipine-HCTZ [facility]) 40-5-12.5 MG TABS Take 1 tablet by mouth daily.    04/12/18  [provider]    Allergies    Amlodipine, Atorvastatin, Crestor [rosuvastatin calcium], Ezetimibe-simvastatin, Sitagliptin, Statins, Celebrex [celecoxib], and  Levemir [insulin detemir]  Review of Systems   Review of Systems  All other systems reviewed and are negative.   Physical Exam Updated Vital Signs BP (!) 143/37   Pulse 70   Temp 97.9 F (36.6 C)   Resp 20   SpO2 92%   Physical Exam Vitals and nursing note reviewed.  Constitutional:      Appearance: She is well-developed. She is ill-appearing.  HENT:     Head: Normocephalic and atraumatic.     Right Ear: External ear normal.     Left Ear: External ear normal.  Eyes:     General: No scleral icterus.       Right eye: No discharge.        Left eye: No discharge.     Conjunctiva/sclera: Conjunctivae normal.  Neck:     Trachea: No tracheal deviation.  Cardiovascular:     Rate and Rhythm: Normal rate and regular rhythm.  Pulmonary:     Effort: Pulmonary effort is normal. No respiratory distress.     Breath sounds: Normal breath sounds. No stridor. No wheezing or rales.  Abdominal:     General: Bowel sounds are normal. There is no distension.     Palpations: Abdomen is soft.     Tenderness: There is no abdominal tenderness. There is no guarding or rebound.  Musculoskeletal:        General: Swelling and tenderness present. No deformity.     Cervical back: Neck supple.     Comments: Tenderness to palpation left foot and calf, erythema noted around the chronic wounds in the lower extremity, some lymphangitic streaking noted, no cyanosis, pulses not palpable  Skin:    General: Skin is warm and dry.     Findings: No rash.  Neurological:     Mental Status: She is alert.     Cranial Nerves: No cranial nerve deficit (no facial droop, extraocular movements intact, no slurred speech).     Sensory: No sensory deficit.     Motor: No abnormal muscle tone or seizure activity.     Coordination: Coordination normal.     ED Results / Procedures / Treatments   Labs (all labs ordered are listed, but only abnormal results are displayed) Labs Reviewed  COMPREHENSIVE METABOLIC PANEL -  Abnormal; Notable for the following components:      Result Value   Chloride 95 (*)    Glucose, Bld 180 (*)    BUN 47 (*)    Creatinine, Ser 2.42 (*)    Albumin 2.8 (*)    AST 50 (*)    ALT 51 (*)    Total Bilirubin 1.6 (*)    GFR, Estimated 21 (*)    All other components within normal limits  CBC WITH DIFFERENTIAL/PLATELET - Abnormal; Notable for the following components:   WBC 11.7 (*)    Hemoglobin 10.8 (*)    MCH 24.3 (*)    MCHC 28.3 (*)    RDW 26.5 (*)    Neutro Abs 10.0 (*)    All other components within normal limits  PROTIME-INR - Abnormal; Notable for the following components:   Prothrombin Time 20.8 (*)    INR 1.9 (*)  All other components within normal limits  CBG MONITORING, ED - Abnormal; Notable for the following components:   Glucose-Capillary 144 (*)    All other components within normal limits  CBG MONITORING, ED - Abnormal; Notable for the following components:   Glucose-Capillary 130 (*)    All other components within normal limits  CULTURE, BLOOD (ROUTINE X 2)  CULTURE, BLOOD (ROUTINE X 2)  LACTIC ACID, PLASMA    EKG None  Radiology DG Chest 2 View  Result Date: 07/20/2020 CLINICAL DATA:  Shortness of breath. Left leg pain and swelling. Chronic swelling of both lower extremities. Diabetic. EXAM: CHEST - 2 VIEW COMPARISON:  None. FINDINGS: Redemonstration of an enlarged cardiac silhouette. Otherwise the heart size and mediastinal contours are unchanged. Aortic arch calcifications. No focal consolidation. No pulmonary edema. No pleural effusion. No pneumothorax. No acute osseous abnormality.  Degenerative changes of the spine. IMPRESSION: Similar-appearing enlarged cardiac silhouette with otherwise no acute cardiopulmonary disease. Electronically Signed   By: Iven Finn M.D.   On: 07/20/2020 20:00   DG Tibia/Fibula Left  Result Date: 07/21/2020 CLINICAL DATA:  Leg swelling, pain EXAM: LEFT TIBIA AND FIBULA - 2 VIEW COMPARISON:  Knee series  03/06/2019 FINDINGS: No acute bony abnormality. Specifically, no fracture, subluxation, or dislocation. Soft tissues are intact. Vascular calcifications noted. IMPRESSION: No acute bony abnormality. Electronically Signed   By: Rolm Baptise M.D.   On: 07/21/2020 11:25   DG Foot Complete Left  Result Date: 07/21/2020 CLINICAL DATA:  Leg swelling, pain EXAM: LEFT FOOT - COMPLETE 3+ VIEW COMPARISON:  09/26/2012 FINDINGS: Prior transmetatarsal amputations of all the toes within the left foot. No acute bony abnormality. Specifically, no fracture, subluxation, or dislocation. Soft tissues are intact. IMPRESSION: Prior transmetatarsal amputation.  No acute bony abnormality. Electronically Signed   By: Rolm Baptise M.D.   On: 07/21/2020 11:25   VAS [place] LOWER EXTREMITY VENOUS (DVT) (ONLY [facility] & [facility])  Result Date: 07/21/2020  Lower Venous DVT Study Indications: Pain.  Limitations: Body habitus, poor ultrasound/tissue interface, open wound, musculoskeletal features and poor patient cooperation, patient unable to tolerate some of exam. Comparison Study: 06/18/2020- negative left lower extremity venous duplex Performing Technologist: [place]audry Mayhew MHA, RDMS, RVT, RDCS  Examination Guidelines: A complete evaluation includes B-mode imaging, spectral Doppler, color Doppler, and power Doppler as needed of all accessible portions of each vessel. Bilateral testing is considered an integral part of a complete examination. Limited examinations for reoccurring indications may be performed as noted. The reflux portion of the exam is performed with the patient in reverse Trendelenburg.  Right Technical Findings: Not visualized segments include CFV.  +---------+---------------+---------+-----------+----------+--------------+ LEFT     CompressibilityPhasicitySpontaneityPropertiesThrombus Aging +---------+---------------+---------+-----------+----------+--------------+ CFV      Full           Yes      Yes                                  +---------+---------------+---------+-----------+----------+--------------+ FV Prox  Full                                                        +---------+---------------+---------+-----------+----------+--------------+ FV Mid   Full                                                        +---------+---------------+---------+-----------+----------+--------------+  FV DistalFull                                                        +---------+---------------+---------+-----------+----------+--------------+ PFV      Full                                                        +---------+---------------+---------+-----------+----------+--------------+ POP      Full           Yes      Yes                                 +---------+---------------+---------+-----------+----------+--------------+   Left Technical Findings: Not visualized segments include SFJ, PTV, peroneal veins.   Summary: LEFT: - There is no evidence of deep vein thrombosis in the lower extremity. However, portions of this examination were limited- see technologist comments above.  - No cystic structure found in the popliteal fossa.  *See table(s) above for measurements and observations.    Preliminary     Procedures Procedures (including critical care time)  Medications Ordered in ED Medications  cefTRIAXone (ROCEPHIN) 1 g in sodium chloride 0.9 % 100 mL IVPB (has no administration in time range)    ED Course  I have reviewed the triage vital signs and the nursing notes.  Pertinent labs & imaging results that were available during my care of the patient were reviewed by me and considered in my medical decision making (see chart for details).  Clinical Course as of Jul 21 1201  Tue Jul 21, 2020  0932 White blood cell elevated compared to previous   [JK]  0932 Renal function abnormal but improved compared to previous   [JK]  0933 Lactic acid level not elevated.   [JK]  1031 No DVT  noted   [JK]  1139 X-rays with acute findings.   [JK]    Clinical Course User Index [JK] Dorie Rank, MD   MDM Rules/Calculators/A&P                          Patient presented to the ED with complaints of left leg pain and swelling.  Patient also states she has been having increasing weakness.  Patient denies any fevers.  On exam she has evidence of cellulitis.  Patient has multiple comorbidities.  She also has known peripheral vascular disease.  Patient is scheduled to see her vascular [facility] at [facility] but is not until later this month.  I am concerned about her leukocytosis and her risk for worsening infection.  Currently no signs of sepsis or osteomyelitis but I do think she would benefit from IV antibiotics and physical therapy.  I will consult the medical service for admission Final Clinical Impression(s) / ED Diagnoses Final diagnoses:  Cellulitis of left lower extremity  PVD (peripheral vascular disease) ([place])      Dorie Rank, MD 07/21/20 1203

## 2020-07-21 NOTE — Progress Notes (Signed)
Left lower extremity venous duplex completed. Refer to "CV Proc" under chart review to view preliminary results.  07/21/2020 10:30 AM Kelby Aline., MHA, RVT, RDCS, RDMS

## 2020-07-22 ENCOUNTER — Encounter (HOSPITAL_COMMUNITY): Payer: Self-pay | Admitting: Internal Medicine

## 2020-07-22 LAB — ANTISTREPTOLYSIN O TITER: ASO: 184 IU/mL (ref 0.0–200.0)

## 2020-07-22 LAB — BASIC METABOLIC PANEL
Anion gap: 10 (ref 5–15)
BUN: 52 mg/dL — ABNORMAL HIGH (ref 8–23)
CO2: 29 mmol/L (ref 22–32)
Calcium: 8.3 mg/dL — ABNORMAL LOW (ref 8.9–10.3)
Chloride: 101 mmol/L (ref 98–111)
Creatinine, Ser: 2.06 mg/dL — ABNORMAL HIGH (ref 0.44–1.00)
GFR, Estimated: 26 mL/min — ABNORMAL LOW (ref >60)
Glucose, Bld: 84 mg/dL (ref 70–99)
Potassium: 4 mmol/L (ref 3.5–5.1)
Sodium: 140 mmol/L (ref 135–145)

## 2020-07-22 LAB — CBC
HCT: 33.5 % — ABNORMAL LOW (ref 36.0–46.0)
Hemoglobin: 9.5 g/dL — ABNORMAL LOW (ref 12.0–15.0)
MCH: 24.5 pg — ABNORMAL LOW (ref 26.0–34.0)
MCHC: 28.4 g/dL — ABNORMAL LOW (ref 30.0–36.0)
MCV: 86.3 fL (ref 80.0–100.0)
Platelets: 278 10*3/uL (ref 150–400)
RBC: 3.88 MIL/uL (ref 3.87–5.11)
RDW: 25.2 % — ABNORMAL HIGH (ref 11.5–15.5)
WBC: 8.6 10*3/uL (ref 4.0–10.5)
nRBC: 0.2 % (ref 0.0–0.2)

## 2020-07-22 LAB — GLUCOSE, CAPILLARY
Glucose-Capillary: 189 mg/dL — ABNORMAL HIGH (ref 70–99)
Glucose-Capillary: 283 mg/dL — ABNORMAL HIGH (ref 70–99)
Glucose-Capillary: 400 mg/dL — ABNORMAL HIGH (ref 70–99)

## 2020-07-22 LAB — CBG MONITORING, ED: Glucose-Capillary: 94 mg/dL (ref 70–99)

## 2020-07-22 LAB — MRSA PCR SCREENING: MRSA by PCR: NEGATIVE

## 2020-07-22 MED ORDER — INFLUENZA VAC A&B SA ADJ QUAD 0.5 ML IM PRSY
0.5000 mL | PREFILLED_SYRINGE | INTRAMUSCULAR | Status: DC
  Filled 2020-07-22: qty 0.5

## 2020-07-22 NOTE — ED Notes (Signed)
Attempted to call report

## 2020-07-22 NOTE — ED Notes (Signed)
Patient refusing to keep blood pressure cuff on despite education. Patient also calling out asking for maintenance to come fix her phone and concerned she is not getting her medications. Patient reoriented to time as she thinks it is 1 pm. Patient informed all of her medications were given as ordered and she would get her morning medications later in the morning.

## 2020-07-22 NOTE — Plan of Care (Signed)
  Problem: Education: Goal: Knowledge of General Education information will improve Description: Including pain rating scale, medication(s)/side effects and non-pharmacologic comfort measures Outcome: Progressing   Problem: Health Behavior/Discharge Planning: Goal: Ability to manage health-related needs will improve Outcome: Progressing   Problem: Activity: Goal: Risk for activity intolerance will decrease Outcome: Progressing   Problem: Elimination: Goal: Will not experience complications related to urinary retention Outcome: Progressing

## 2020-07-22 NOTE — ED Notes (Signed)
Breakfast ordered 

## 2020-07-22 NOTE — Progress Notes (Signed)
PROGRESS NOTE  [name] XBM:[id number] DOB: 06-Jul-1951 DOA: 07/20/2020 PCP: [name], DO  HPI/Recap of past 24 hours: HPI from Dr Sherry Ruffing [name] is a 69 y.o. female with medical history significant of aortic stenosis status post TAVR, chronic diastolic CHF, paroxysmal A. fib, CKD stage IV, IDDM with insulin resistance, PVD, morbid obesity, presented with worsening of left foot (history of transmetatarsal amputation) and shin area rash swelling and pain.  Patient started noticed above-mentioned changes 2 to 3 days ago, gradually getting worse, she denies any fever chills, no chest pain or shortness of breath. She denied any any rash or pain on the right leg. ED Course: Patient was found to have acute cellulitis of left foot and shin area, WBC 11.  DVT study negative.  X-ray showed no signs of bony infection.    Today, patient denies any new complaints, denies any chest pain, shortness of breath, fever/chills, abdominal pain.    Assessment/Plan: Active Problems:   Cellulitis   Left leg cellulitis Currently afebrile, with resolved leukocytosis Estimate involvement more than 1/3 of surface area of the left leg, moderate severity of the cellulitis Lower extremity Doppler negative, x-ray of left leg showed no signs of bone infection MRSA PCR negative Continue IV cefazolin  Acute on chronic ambulation dysfunction PT evaluation  PVD Arterial Doppler was done on last admission two weeks ago showing distal popliteal and left-sided occlusion, according to care everywhere record, patient has been following with [facility] vascular surgery and patient underwent left distal SFA-TP trunk bypass graft with CryoVein in January 2021, and TMA in February 2021 also at [facility]. ABI on left =1.1 in September 2021 Outpatient vascular F/U  History of chronic diastolic CHF Appears compensated Chest x-ray unremarkable Echo done on 11/21 showed EF of 55, no regional  wall motion abnormality, moderate pericardial effusion Strict I's and O's, daily weights Continue hydralazine, torsemide  CKD stage IV Stable Daily BMP  Anemia of chronic kidney disease Hemoglobin stable Daily CBC  IDDM uncontrolled with hyperglycemia SSI, Accu-Cheks, hypoglycemic protocol  PAF HR Controlled Continue amiodarone, continue Eliquis  Hyperlipidemia Continue pravastatin          Malnutrition Type:      Malnutrition Characteristics:      Nutrition Interventions:       Estimated body mass index is 30.25 kg/m as calculated from the following:   Height as of this encounter: 5\' 6"  (1.676 m).   Weight as of this encounter: 85 kg.     Code Status: Full  Family Communication: None at bedside  Disposition Plan: Status is: Inpatient  Remains inpatient appropriate because:Inpatient level of care appropriate due to severity of illness   Dispo: The patient is from: Home              Anticipated d/c is to: Home              Anticipated d/c date is: 2 days              Patient currently is not medically stable to d/c.    Consultants: None  Procedures:  None  Antimicrobials:  IV cefazolin  DVT prophylaxis: Eliquis   Objective: Vitals:   07/22/20 0625 07/22/20 0915 07/22/20 1028 07/22/20 1450  BP: (!) 123/49 136/72 (!) 134/59 (!) 144/46  Pulse: 77 80 67 76  Resp: 18 19 16 17   Temp: 98.7 F (37.1 C)  97.6 F (36.4 C) 98.6 F (37 C)  TempSrc: Oral  Oral Oral  SpO2: 100% 100% 99% 90%  Weight:   85 kg   Height:   5\' 6"  (1.676 m)     Intake/Output Summary (Last 24 hours) at 07/22/2020 1908 Last data filed at 07/22/2020 1700 Gross per 24 hour  Intake 530 ml  Output --  Net 530 ml   Filed Weights   07/22/20 1028  Weight: 85 kg    Exam:  General: NAD   Cardiovascular: S1, S2 present  Respiratory: CTAB  Abdomen: Soft, nontender, nondistended, bowel sounds present  Musculoskeletal: 1+ pedal edema noted on the left  extremity, normal on the right  Skin:  Diffuse rash on left lower extremity below-knee, tender  Psychiatry: Normal mood   Data Reviewed: CBC: Recent Labs  Lab 07/20/20 1545 07/22/20 0627  WBC 11.7* 8.6  NEUTROABS 10.0*  --   HGB 10.8* 9.5*  HCT 38.1 33.5*  MCV 85.6 86.3  PLT 303 035   Basic Metabolic Panel: Recent Labs  Lab 07/20/20 1545 07/22/20 0627  NA 138 140  K 3.9 4.0  CL 95* 101  CO2 31 29  GLUCOSE 180* 84  BUN 47* 52*  CREATININE 2.42* 2.06*  CALCIUM 9.0 8.3*   GFR: Estimated Creatinine Clearance: 28.3 mL/min (A) (by C-G formula based on SCr of 2.06 mg/dL (H)). Liver Function Tests: Recent Labs  Lab 07/20/20 1545  AST 50*  ALT 51*  ALKPHOS 64  BILITOT 1.6*  PROT 6.8  ALBUMIN 2.8*   No results for input(s): LIPASE, AMYLASE in the last 168 hours. No results for input(s): AMMONIA in the last 168 hours. Coagulation Profile: Recent Labs  Lab 07/20/20 1545  INR 1.9*   Cardiac Enzymes: No results for input(s): CKTOTAL, CKMB, CKMBINDEX, TROPONINI in the last 168 hours. BNP (last 3 results) No results for input(s): PROBNP in the last 8760 hours. HbA1C: No results for input(s): HGBA1C in the last 72 hours. CBG: Recent Labs  Lab 07/21/20 1839 07/21/20 2240 07/22/20 0831 07/22/20 1146 07/22/20 1602  GLUCAP 137* 208* 94 189* 283*   Lipid Profile: No results for input(s): CHOL, HDL, LDLCALC, TRIG, CHOLHDL, LDLDIRECT in the last 72 hours. Thyroid Function Tests: No results for input(s): TSH, T4TOTAL, FREET4, T3FREE, THYROIDAB in the last 72 hours. Anemia Panel: No results for input(s): VITAMINB12, FOLATE, FERRITIN, TIBC, IRON, RETICCTPCT in the last 72 hours. Urine analysis:    Component Value Date/Time   COLORURINE YELLOW 02/19/2020 1822   APPEARANCEUR HAZY (A) 02/19/2020 1822   APPEARANCEUR Clear 06/19/2018 1637   LABSPEC 1.011 02/19/2020 1822   PHURINE 5.0 02/19/2020 1822   GLUCOSEU >=500 (A) 02/19/2020 1822   HGBUR SMALL (A)  02/19/2020 1822   BILIRUBINUR NEGATIVE 02/19/2020 1822   BILIRUBINUR Negative 06/19/2018 1637   KETONESUR NEGATIVE 02/19/2020 1822   PROTEINUR NEGATIVE 02/19/2020 1822   UROBILINOGEN 1.0 11/07/2014 2242   NITRITE POSITIVE (A) 02/19/2020 1822   LEUKOCYTESUR MODERATE (A) 02/19/2020 1822   Sepsis Labs: @LABRCNTIP (procalcitonin:4,lacticidven:4)  ) Recent Results (from the past 240 hour(s))  Culture, blood (Routine x 2)     Status: None (Preliminary result)   Collection Time: 07/20/20  3:45 PM   Specimen: BLOOD  Result Value Ref Range Status   Specimen Description BLOOD RIGHT ANTECUBITAL  Final   Special Requests   Final    BOTTLES DRAWN AEROBIC AND ANAEROBIC Blood Culture adequate volume   Culture   Final    NO GROWTH 2 DAYS Performed at [facility], [place] [address], [place], [place]  [address]    Report Status PENDING  Incomplete  Culture, blood (Routine x 2)     Status: None (Preliminary result)   Collection Time: 07/20/20  3:50 PM   Specimen: BLOOD RIGHT ARM  Result Value Ref Range Status   Specimen Description BLOOD RIGHT ARM  Final   Special Requests   Final    BOTTLES DRAWN AEROBIC AND ANAEROBIC Blood Culture adequate volume   Culture   Final    NO GROWTH 2 DAYS Performed at [facility], [address] [place], [place] [address]    Report Status PENDING  Incomplete  Resp Panel by RT-PCR (Flu A&B, Covid) Nasopharyngeal Swab     Status: None   Collection Time: 07/21/20 12:19 PM   Specimen: Nasopharyngeal Swab; Nasopharyngeal(NP) swabs in vial transport medium  Result Value Ref Range Status   SARS Coronavirus 2 by RT PCR NEGATIVE NEGATIVE Final    Comment: (NOTE) SARS-CoV-2 target nucleic acids are NOT DETECTED.  The SARS-CoV-2 RNA is generally detectable in upper respiratory specimens during the acute phase of infection. The lowest concentration of SARS-CoV-2 viral copies this assay can detect is 138 copies/mL. A negative result does not preclude  SARS-Cov-2 infection and should not be used as the sole basis for treatment or other patient management decisions. A negative result may occur with  improper specimen collection/handling, submission of specimen other than nasopharyngeal swab, presence of viral mutation(s) within the areas targeted by this assay, and inadequate number of viral copies(<138 copies/mL). A negative result must be combined with clinical observations, patient history, and epidemiological information. The expected result is Negative.  Fact Sheet for Patients:  EntrepreneurPulse.com.au  Fact Sheet for Healthcare Providers:  IncredibleEmployment.be  This test is no t yet approved or cleared by the [place] FDA and  has been authorized for detection and/or diagnosis of SARS-CoV-2 by FDA under an Emergency Use Authorization (EUA). This EUA will remain  in effect (meaning this test can be used) for the duration of the COVID-19 declaration under Section 564(b)(1) of the Act, 21 U.S.C.section 360bbb-3(b)(1), unless the authorization is terminated  or revoked sooner.       Influenza A by PCR NEGATIVE NEGATIVE Final   Influenza B by PCR NEGATIVE NEGATIVE Final    Comment: (NOTE) The Xpert Xpress SARS-CoV-2/FLU/RSV plus assay is intended as an aid in the diagnosis of influenza from Nasopharyngeal swab specimens and should not be used as a sole basis for treatment. Nasal washings and aspirates are unacceptable for Xpert Xpress SARS-CoV-2/FLU/RSV testing.  Fact Sheet for Patients: EntrepreneurPulse.com.au  Fact Sheet for Healthcare Providers: IncredibleEmployment.be  This test is not yet approved or cleared by the [place] FDA and has been authorized for detection and/or diagnosis of SARS-CoV-2 by FDA under an Emergency Use Authorization (EUA). This EUA will remain in effect (meaning this test can be used) for the duration of  the COVID-19 declaration under Section 564(b)(1) of the Act, 21 U.S.C. section 360bbb-3(b)(1), unless the authorization is terminated or revoked.  Performed at [facility], [place] [address], [place], [place] [address]   MRSA PCR Screening     Status: None   Collection Time: 07/22/20 12:38 AM   Specimen: Nasopharyngeal  Result Value Ref Range Status   MRSA by PCR NEGATIVE NEGATIVE Final    Comment:        The GeneXpert MRSA Assay (FDA approved for NASAL specimens only), is one component of a comprehensive MRSA colonization surveillance program. It is not  intended to diagnose MRSA infection nor to guide or monitor treatment for MRSA infections. Performed at [facility], [place] [address], [place], [place] [address]       Studies: No results found.  Scheduled Meds: . amiodarone  200 mg Oral Daily  . apixaban  5 mg Oral BID  . docusate sodium  100 mg Oral BID  . ferrous sulfate  325 mg Oral BID WC  . hydrALAZINE  50 mg Oral TID  . [START ON 07/23/2020] influenza vaccine adjuvanted  0.5 mL Intramuscular Tomorrow-1000  . insulin aspart  0-20 Units Subcutaneous TID WC  . insulin aspart  0-5 Units Subcutaneous QHS  . insulin NPH Human  25 Units Subcutaneous BID AC & HS  . loratadine  10 mg Oral Daily  . omega-3 acid ethyl esters  4 g Oral BID  . pantoprazole  40 mg Oral Daily  . pravastatin  80 mg Oral q1800  . Tafamidis  61 mg Oral Daily  . torsemide  40 mg Oral Daily    Continuous Infusions: .  ceFAZolin (ANCEF) IV 1 g (07/22/20 1048)     LOS: 1 day     Alma Friendly, MD [facility]  If 7PM-7AM, please contact night-coverage [email or web address] 07/22/2020, 7:08 PM

## 2020-07-22 NOTE — Plan of Care (Signed)
  Problem: Education: Goal: Knowledge of General Education information will improve Description: Including pain rating scale, medication(s)/side effects and non-pharmacologic comfort measures Outcome: Progressing   Problem: Clinical Measurements: Goal: Ability to maintain clinical measurements within normal limits will improve Outcome: Progressing Goal: Will remain free from infection Outcome: Progressing Goal: Diagnostic test results will improve Outcome: Progressing Goal: Respiratory complications will improve Outcome: Progressing Goal: Cardiovascular complication will be avoided Outcome: Progressing   Problem: Activity: Goal: Risk for activity intolerance will decrease Outcome: Progressing   Problem: Coping: Goal: Level of anxiety will decrease Outcome: Progressing   Problem: Pain Managment: Goal: General experience of comfort will improve Outcome: Progressing   Problem: Clinical Measurements: Goal: Will remain free from infection Outcome: Progressing

## 2020-07-22 NOTE — Progress Notes (Signed)
1020 Received pt from ED, A&O x4. Left lower leg with dressing dry and intact.  Pt is O2 at 2L per nasal cannula. Denies SOB.

## 2020-07-23 ENCOUNTER — Encounter (HOSPITAL_COMMUNITY): Payer: Medicare Other

## 2020-07-23 LAB — GLUCOSE, CAPILLARY
Glucose-Capillary: 180 mg/dL — ABNORMAL HIGH (ref 70–99)
Glucose-Capillary: 231 mg/dL — ABNORMAL HIGH (ref 70–99)

## 2020-07-23 LAB — CBC WITH DIFFERENTIAL/PLATELET
Abs Immature Granulocytes: 0.05 K/uL (ref 0.00–0.07)
Basophils Absolute: 0.1 K/uL (ref 0.0–0.1)
Basophils Relative: 1 %
Eosinophils Absolute: 0.1 K/uL (ref 0.0–0.5)
Eosinophils Relative: 1 %
HCT: 32 % — ABNORMAL LOW (ref 36.0–46.0)
Hemoglobin: 9.5 g/dL — ABNORMAL LOW (ref 12.0–15.0)
Immature Granulocytes: 1 %
Lymphocytes Relative: 14 %
Lymphs Abs: 1.1 K/uL (ref 0.7–4.0)
MCH: 24.9 pg — ABNORMAL LOW (ref 26.0–34.0)
MCHC: 29.7 g/dL — ABNORMAL LOW (ref 30.0–36.0)
MCV: 83.8 fL (ref 80.0–100.0)
Monocytes Absolute: 0.8 K/uL (ref 0.1–1.0)
Monocytes Relative: 10 %
Neutro Abs: 6.1 K/uL (ref 1.7–7.7)
Neutrophils Relative %: 73 %
Platelets: 284 K/uL (ref 150–400)
RBC: 3.82 MIL/uL — ABNORMAL LOW (ref 3.87–5.11)
RDW: 25 % — ABNORMAL HIGH (ref 11.5–15.5)
WBC: 8.3 K/uL (ref 4.0–10.5)
nRBC: 0 % (ref 0.0–0.2)

## 2020-07-23 LAB — BASIC METABOLIC PANEL
Anion gap: 15 (ref 5–15)
BUN: 67 mg/dL — ABNORMAL HIGH (ref 8–23)
CO2: 24 mmol/L (ref 22–32)
Calcium: 8.2 mg/dL — ABNORMAL LOW (ref 8.9–10.3)
Chloride: 96 mmol/L — ABNORMAL LOW (ref 98–111)
Creatinine, Ser: 2.9 mg/dL — ABNORMAL HIGH (ref 0.44–1.00)
GFR, Estimated: 17 mL/min — ABNORMAL LOW (ref >60)
Glucose, Bld: 249 mg/dL — ABNORMAL HIGH (ref 70–99)
Potassium: 3.7 mmol/L (ref 3.5–5.1)
Sodium: 135 mmol/L (ref 135–145)

## 2020-07-23 MED ORDER — CEPHALEXIN 500 MG PO CAPS: 500.0000 mg | ORAL_CAPSULE | Freq: Four times a day (QID) | ORAL | 0 refills | Status: DC

## 2020-07-23 MED ORDER — CEPHALEXIN 500 MG PO CAPS: 500.0000 mg | ORAL_CAPSULE | Freq: Two times a day (BID) | ORAL | 0 refills | Status: AC

## 2020-07-23 NOTE — TOC Transition Note (Signed)
Transition of Care [facility]) - CM/SW Discharge Note   Patient Details  Name: [name] MRN: [id number] Date of Birth: 10/30/1950  Transition of [facility]) CM/SW Contact:  Sharin Mons, RN Phone Number: 07/23/2020, 1:41 PM   Clinical Narrative:    Patient will DC to: home Anticipated DC date: 07/23/2020 Family notified: husband Transport by: car  Per MD patient ready for DC today . RN, patient and pt's husband notified of DC. [place] aware of d/c plan.  Post hospital f/u noted on AVS.  RNCM will sign off for now as intervention is no longer needed.    Final next level of care: [place] Barriers to Discharge: No Barriers Identified   Patient Goals and CMS Choice     Choice offered to / list presented to : Patient  Discharge Placement                       Discharge Plan and Services   Discharge Planning Services: CM Consult Post Acute Care Choice: Resumption of Svcs/PTA Provider                    HH Arranged: PT,RN   Date [place]: 07/23/20 Time [place]: 9038 Representative spoke with at [place]: [place] ([place]) Interventions     Readmission Risk Interventions Readmission Risk Prevention Plan 07/23/2020 07/13/2020 04/25/2019  Transportation Screening Complete Complete Complete  PCP or Specialist Appt within 5-7 Days - - -  PCP or Specialist Appt within 3-5 Days - - Patient refused  Home Care Screening - - -  Medication Review (RN CM) - - -  [place] or Home Care Consult Complete - Complete  Social Work Consult for [place] Planning/Counseling - - Complete  Palliative Care Screening Not Applicable - Not Applicable  Medication Review Press photographer) Complete Complete Complete  PCP or Specialist appointment within 3-5 days of discharge - Complete -  [place] or [place] - Complete -  SW Recovery Care/Counseling Consult - Complete -  [place] - Not Applicable -  [place] - Not Applicable -  Some recent data might be hidden

## 2020-07-23 NOTE — Progress Notes (Addendum)
Inpatient Diabetes Program Recommendations  AACE/ADA: New Consensus Statement on Inpatient Glycemic Control (2015)  Target Ranges:  Prepandial:   less than 140 mg/dL      Peak postprandial:   less than 180 mg/dL (1-2 hours)      Critically ill patients:  140 - 180 mg/dL   Lab Results  Component Value Date   GLUCAP 180 (H) 07/23/2020   HGBA1C 6.4 (H) 07/09/2020    Review of Glycemic Control Results for [name], [name] (MRN [id number]) as of 07/23/2020 11:01  Ref. Range 07/22/2020 11:46 07/22/2020 16:02 07/22/2020 21:38 07/23/2020 06:47  Glucose-Capillary Latest Ref Range: 70 - 99 mg/dL 189 (H) 283 (H) 400 (H) 180 (H)   Diabetes history: Type 2 DM Outpatient Diabetes medications: NPH 50 units BID, Regular 20-30 units TID Current orders for Inpatient glycemic control: NPH 25 units BID, Novolog 0-20 units TID, Novolog 0-5 units QHS  Inpatient Diabetes Program Recommendations:    Consider adding Novolog 4 units TID (assuming patient is consuming >50% of meal).   Thanks, Bronson Curb, MSN, RNC-OB Diabetes Coordinator [phone] (8a-5p)

## 2020-07-23 NOTE — Consult Note (Signed)
WOC Nurse Consult Note: Patient receiving care in [facility] [phone] Reason for Consult: LLE wounds Wound type: LLE ulcers, crusted. Amputation of the toes on the same side up to the plantar region.  Pressure Injury POA: NA Measurement: Anterior aspect of the LE measures 3.8 cm x 0.7 cm crusted with no drainage Posterior aspect of the LE measures 4.8 cm x 1.5 cm crusted with no drainage Periwound: dry but intact Dressing procedure/placement/frequency: Wash LLE with no rinse cleansing solution, pat dry then apply sween moisturing cream (pink top tube in clean supply room). May wrap with Kerlex if patient prefers for protection.   Monitor the wound area(s) for worsening of condition such as: Signs/symptoms of infection, increase in size, development of or worsening of odor, development of pain, or increased pain at the affected locations.   Notify the medical team if any of these develop.  Thank you for the consult. [place] nurse will not follow at this time.   Please re-consult the [place] team if needed.  Cathlean Marseilles Tamala Julian, MSN, RN, [place], Lysle Pearl, [facility] Wound Treatment Associate Pager [phone]

## 2020-07-23 NOTE — TOC Initial Note (Signed)
Transition of Care [facility]) - Initial/Assessment Note    Patient Details  Name: [name] MRN: [id number] Date of Birth: 1950/10/24  Transition of Care [facility]) CM/SW Contact:    Sharin Mons, RN Phone Number: 07/23/2020, 1:01 PM  Clinical Narrative:                 Readmitted with acute cellulitis of left foot and shin. Hx of aortic stenosis s/p TAVR,  CHF, paroxysmal A. fib, CKD stage IV, IDDM , PVD, morbid obesity, left foot transmetatarsal amputation. From home with husband. Pt states active with [facility] ( RN,PT) and would like to continue with their services once d/c.   Resumption orders for [facility] ( RN,PT) services placed and liaison made aware.  Pt already has W/C, rolling walker,  3 in 1/ BSC.  Pt without Rx med concerns or affordability. Pt will have transportation to home once d/c.  TOC team will continue to monitor and follow .......  Expected Discharge Plan: [place] Barriers to Discharge: Continued Medical Work up   Patient Goals and CMS Choice     Choice offered to / list presented to : Patient  Expected Discharge Plan and Services Expected Discharge Plan: [place]   Discharge Planning Services: CM Consult Post Acute Care Choice: Resumption of Svcs/PTA Provider Living arrangements for the past 2 months: Single Family Home Expected Discharge Date: 07/23/20                         HH Arranged: PT,RN   Date HH Agency Contacted: 07/23/20 Time HH Agency Contacted: 10 Representative spoke with at [place]: Tommi Rumps  Prior Living Arrangements/Services Living arrangements for the past 2 months: [place] with:: Spouse Patient language and need for interpreter reviewed:: Yes Do you feel safe going back to the place where you live?: Yes      Need for Family Participation in Patient Care: Yes (Comment) Care giver support system in place?: Yes (comment)   Criminal Activity/Legal Involvement Pertinent to  Current Situation/Hospitalization: No - Comment as needed  Activities of Daily Living Home Assistive Devices/Equipment: Bedside commode/3-in-1,Walker (specify type),Wheelchair ADL Screening (condition at time of admission) Patient's cognitive ability adequate to safely complete daily activities?: Yes Is the patient deaf or have difficulty hearing?: No Does the patient have difficulty seeing, even when wearing glasses/contacts?: No Does the patient have difficulty concentrating, remembering, or making decisions?: No Patient able to express need for assistance with ADLs?: Yes Does the patient have difficulty dressing or bathing?: No Independently performs ADLs?: Yes (appropriate for developmental age) Communication: Independent Dressing (OT): Independent Is this a change from baseline?: Pre-admission baseline Grooming: Independent Is this a change from baseline?: Pre-admission baseline Feeding: Independent Bathing: Independent Is this a change from baseline?: Pre-admission baseline Toileting: Independent Is this a change from baseline?: Pre-admission baseline In/Out Bed: Independent Is this a change from baseline?: Pre-admission baseline Walks in Home: Needs assistance Is this a change from baseline?: Pre-admission baseline Does the patient have difficulty walking or climbing stairs?: Yes Weakness of Legs: Both Weakness of Arms/Hands: None  Permission Sought/Granted   Permission granted to share information with : Yes, Verbal Permission Granted              Emotional Assessment Appearance:: Appears stated age Attitude/Demeanor/Rapport: Gracious Affect (typically observed): Accepting Orientation: : Oriented to Self,Oriented to Place,Oriented to  Time,Oriented to Situation Alcohol / Substance Use: Not Applicable  Psych Involvement: No (comment)  Admission diagnosis:  Cellulitis [L03.90] PVD (peripheral vascular disease) (HCC) [I73.9] Cellulitis of left lower extremity  [L03.116] Patient Active Problem List   Diagnosis Date Noted  . Cellulitis 07/21/2020  . Cellulitis of left lower leg 07/02/2020  . CKD stage 4 due to type 2 diabetes mellitus ([place]) 02/27/2020  . Essential hypertension 02/26/2020  . Class 3 obesity 02/26/2020  . Bradycardia 02/19/2020  . Hyperglycemia   . Hospital discharge follow-up 01/24/2020  . Non-STEMI (non-ST elevated myocardial infarction) ([place]) 01/16/2020  . NSTEMI (non-ST elevated myocardial infarction) ([place]) 01/16/2020  . Acute on chronic diastolic heart failure ([place]) 12/09/2019  . S/P transmetatarsal amputation of foot, left ([place]) 10/23/2019  . Subacute bacterial endocarditis 05/21/2019  . 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 Bacteremia due to Enterococcus   . Pressure injury of skin 04/16/2019  . Infected wound 04/16/2019  . Weakness generalized 04/15/2019  . SIRS (systemic inflammatory response syndrome) ([place]) 04/15/2019  . Anasarca 09/19/2018  . Acute encephalopathy 06/16/2018  . 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 Acute on chronic diastolic CHF (congestive heart failure) ([place]) 05/10/2018  . Morbid obesity ([place])   . Hypertension associated with diabetes ([place])   . Chronic kidney disease   . Sleep apnea   . Hyperlipidemia   . GERD (gastroesophageal reflux disease)   . Persistent atrial fibrillation with rapid ventricular response ([place])   . S/P TAVR (transcatheter aortic valve replacement)   . Severe aortic stenosis   . Vertigo 05/06/2015  . TIA (transient ischemic attack), history of 11/07/2014  . 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 CKD stage 3 due to type 2 diabetes mellitus ([place]) 06/03/2014  . Type II diabetes mellitus with renal manifestations ([place]) 06/03/2014  . Type II diabetes mellitus with peripheral circulatory disorder ([place]) 06/03/2014  . Hyperlipidemia associated with type 2 diabetes mellitus ([place]) 12/26/2008  . Benign essential HTN 12/26/2008  . Coronary artery disease, non-occlusive  12/26/2008  . Pericardial effusion 12/26/2008   PCP:  [name], DO Pharmacy:   [id number] - [place], [place] [address] Phone: [phone] Fax: [phone]     Social Determinants of Health (SDOH) Interventions    Readmission Risk Interventions Readmission Risk Prevention Plan 07/13/2020 04/25/2019 05/10/2018  Transportation Screening Complete Complete Complete  PCP or Specialist Appt within 5-7 Days - - Complete  PCP or Specialist Appt within 3-5 Days - Patient refused -  Home Care Screening - - Complete  Medication Review (RN CM) - - Complete  [place] or [place] - Complete -  Social Work Consult for [place] Planning/Counseling - Complete -  Palliative Care Screening - Not Applicable -  Medication Review Press photographer) Complete Complete -  PCP or Specialist appointment within 3-5 days of discharge Complete - -  [place] or Home Care Consult Complete - -  SW Recovery Care/Counseling Consult Complete - -  Palliative Care Screening Not Applicable - -  [place] Not Applicable - -  Some recent data might be hidden

## 2020-07-23 NOTE — Progress Notes (Signed)
Patient evaluated by Physical Therapy. Typically transfers with walker and assist from family. Patient planning for d/c today, will have necessary DME and assist. Would benefit from HHPT services, which pt reports she is active with. No DME needs. Pt declines need for acute OT Evaluation.  Formal note to follow.  [name], PT, DPT Acute Rehabilitation Services  Pager [phone] Office [phone]

## 2020-07-23 NOTE — Discharge Summary (Signed)
Discharge Summary  [name] XNA:[id number] DOB: [birth year]  PCP: [name], DO  Admit date: 07/20/2020 Discharge date: 07/23/2020  Time spent: 40 mins  Recommendations for Outpatient Follow-up:  1. PCP in 1 week  Discharge Diagnoses:  Active Hospital Problems   Diagnosis Date Noted  . Cellulitis 07/21/2020    Resolved Hospital Problems  No resolved problems to display.    Discharge Condition: Stable  Diet recommendation: Heart healthy, mod carb  Vitals:   07/23/20 0420 07/23/20 0728  BP: 126/62 (!) 127/55  Pulse: 67 70  Resp: 17 18  Temp: 98.1 F (36.7 C) 98.8 F (37.1 C)  SpO2: 96% 95%    History of present illness:  [name] Lawsonis a 69 y.o.femalewith medical history significant ofaortic stenosis status post TAVR, chronic diastolic CHF, paroxysmal A. fib, CKD stage IV,IDDMwith insulin resistance, PVD, morbid obesity, presented with worsening of left foot (history of transmetatarsal amputation)and shin area rash swelling and pain. Patient started noticed above-mentioned changes 2 to 3 days ago, gradually getting worse, she denies any fever chills, no chest pain or shortness of breath. She denied any any rash or pain on the right leg. ED Course:Patient was found to have acute cellulitis of left foot and shin area, WBC 11.DVT study negative. X-ray showed no signs of bony infection.    Today, patient denies any new complaints, denies any worsening leg pain, swelling no erythema, denies any chest pain, shortness of breath, abdominal pain, fever/chills.  Patient eager to be discharged.  Patient already set up with [facility] RN, PT.    Hospital Course:  Active Problems:   Cellulitis  Left leg cellulitis Currently afebrile, with resolved leukocytosis Lower extremity Doppler negative, x-ray of left leg showed no signs of bone infection MRSA PCR negative Wound care consulted S/P IV cefazolin--> PO keflex to complete 7 days  Acute on chronic  ambulation dysfunction Continue HHPT  PVD Arterial Doppler was done on last admissiontwo weeks agoshowing distal popliteal and left-sided occlusion, according to care everywhere record, patient has been following with [facility] vascular surgery and patient underwent left distal SFA-TP trunk bypass graft with CryoVein in January 2021, and TMA in February 2021 also at [place].ABI on left =1.1 in September 2021 Outpatient vascular F/U  History of chronic diastolic CHF Appears compensated Chest x-ray unremarkable Echo done on 11/21 showed EF of 55, no regional wall motion abnormality, moderate pericardial effusion Continue hydralazine, torsemide  CKD stage IV Stable Follow up with PCP with repeat labs, will need follow up with nephrology   Anemia of chronic kidney disease Hemoglobin stable Follow up with PCP  IDDM uncontrolled withhyperglycemia Continue home insulin regimen   PAF HR Controlled Continue amiodarone, continue Eliquis  Hyperlipidemia Continue pravastatin       Malnutrition Type:      Malnutrition Characteristics:      Nutrition Interventions:      Estimated body mass index is 30.25 kg/m as calculated from the following:   Height as of this encounter: 5\' 6"  (1.676 m).   Weight as of this encounter: 85 kg.    Procedures:  None  Consultations:  None  Discharge Exam: BP (!) 127/55 (BP Location: Right Arm)   Pulse 70   Temp 98.8 F (37.1 C) (Oral)   Resp 18   Ht 5\' 6"  (1.676 m)   Wt 85 kg   SpO2 95%   BMI 30.25 kg/m   General: NAD Cardiovascular: S1, S2 present Respiratory: CTA B Skin: Improved  rash on left lower extremity below knee, no edema noted    Discharge Instructions You were cared for by a hospitalist during your hospital stay. If you have any questions about your discharge medications or the care you received while you were in the hospital after you are discharged, you can call the unit and asked to  speak with the hospitalist on call if the hospitalist that took care of you is not available. Once you are discharged, your primary care physician will handle any further medical issues. Please note that NO REFILLS for any discharge medications will be authorized once you are discharged, as it is imperative that you return to your primary care physician (or establish a relationship with a primary care physician if you do not have one) for your aftercare needs so that they can reassess your need for medications and monitor your lab values.  Discharge Instructions    Diet - low sodium heart healthy   Complete by: As directed    Discharge wound care:   Complete by: As directed    Wash LLE with no rinse cleansing solution, pat dry then apply sween moisturing cream (pink top tube in clean supply room). May wrap with Kerlex if patient prefers for protection.   Increase activity slowly   Complete by: As directed      Allergies as of 07/23/2020      Reactions   Amlodipine Swelling   Ankle swelling   Atorvastatin Other (See Comments)   Myalgias   Crestor [rosuvastatin Calcium] Other (See Comments)   Stiffness and back pain   Ezetimibe-simvastatin Other (See Comments)   Leg cramps   Sitagliptin Other (See Comments)   Unknown reaction   Celebrex [celecoxib] Rash   Levemir [insulin Detemir] Rash      Medication List    TAKE these medications   Accu-Chek Aviva Plus test strip Generic drug: glucose blood USE TO CHECK BLOOD SUGAR UP TO TWICE DAILY OR AS INSTRUCTED What changed: See the new instructions.   amiodarone 200 MG tablet Commonly known as: PACERONE Take 1 tablet 2 times a day for 7 days, then reduce down to 1 tablet once daily What changed:   how much to take  how to take this  when to take this  additional instructions   B-D INS SYR ULTRAFINE 1CC/31G 31G X 5/16" 1 ML Misc Generic drug: Insulin Syringe-Needle U-100 USE TO INJECT INSULIN TWICE A DAY AS INSTRUCTED What  changed: See the new instructions.   ReliOn Insulin Syringe 31G X 15/64" 1 ML Misc Generic drug: Insulin Syringe-Needle U-100 Inject 1 Syringe into the skin 3 (three) times daily. What changed: Another medication with the same name was changed. Make sure you understand how and when to take each.   benzonatate 100 MG capsule Commonly known as: Tessalon Perles Take 1 capsule (100 mg total) by mouth 3 (three) times daily as needed. What changed: reasons to take this   CALCIUM 600 PO Take 1 tablet by mouth daily.   cephALEXin 500 MG capsule Commonly known as: KEFLEX Take 1 capsule (500 mg total) by mouth 2 (two) times daily for 5 days.   docusate sodium 50 MG capsule Commonly known as: COLACE Take 2 capsules (100 mg total) by mouth 2 (two) times daily. What changed: when to take this   Eliquis 5 MG Tabs tablet Generic drug: apixaban TAKE 1 TABLET BY MOUTH TWICE A DAY What changed: how much to take   ferrous sulfate 325 (65 FE)  MG tablet Commonly known as: FerrouSul Take 1 tablet (325 mg total) by mouth 2 (two) times daily with a meal.   hydrALAZINE 50 MG tablet Commonly known as: APRESOLINE Take 50 mg by mouth 3 (three) times daily.   insulin NPH Human 100 UNIT/ML injection Commonly known as: NOVOLIN N Inject 0.25 mLs (25 Units total) into the skin 2 (two) times daily at 8 am and 10 pm. What changed:   how much to take  when to take this   Insulin Pen Needle 32G X 4 MM Misc Use to inject insulin with insulin pen   Livalo 4 MG Tabs Generic drug: Pitavastatin Calcium Take 4 mg by mouth daily.   loratadine 10 MG tablet Commonly known as: CLARITIN Take 10 mg by mouth daily.   meclizine 12.5 MG tablet Commonly known as: ANTIVERT Take 1 tablet (12.5 mg total) by mouth 3 (three) times daily as needed for dizziness.   MULTIVITAMIN GUMMIES ADULT PO Take 2 each by mouth daily.   NovoLIN R ReliOn 100 units/mL injection Generic drug: insulin regular Inject 20-30  Units into the skin 2 (two) times daily before a meal. Per sliding scale   nutrition supplement (JUVEN) Pack Take 1 packet by mouth 2 (two) times daily between meals.   pantoprazole 40 MG tablet Commonly known as: PROTONIX Take 1 tablet (40 mg total) by mouth daily.   Praluent 75 MG/ML Soaj Generic drug: Alirocumab Inject 75 mg into the skin every 14 (fourteen) days.   torsemide 20 MG tablet Commonly known as: DEMADEX Take 40 mg (2 tablets) every morning and 20 mg (1 tablet) every evening What changed:   how much to take  how to take this  when to take this  additional instructions   triamcinolone 0.1 % Commonly known as: KENALOG Apply 1 application topically daily.   Vascepa 1 g capsule Generic drug: icosapent Ethyl TAKE 2 CAPSULES (2 G TOTAL) BY MOUTH 2 (TWO) TIMES A DAY. What changed: See the new instructions.   VITAMIN D3 PO Take 1 tablet by mouth daily.   Vyndamax 61 MG Caps Generic drug: Tafamidis Take 61 mg by mouth daily.            Discharge Care Instructions  (From admission, onward)         Start     Ordered   07/23/20 0000  Discharge wound care:       Comments: Wash LLE with no rinse cleansing solution, pat dry then apply sween moisturing cream (pink top tube in clean supply room). May wrap with Kerlex if patient prefers for protection.   07/23/20 1031         Allergies  Allergen Reactions  . Amlodipine Swelling    Ankle swelling  . Atorvastatin Other (See Comments)    Myalgias   . Crestor [Rosuvastatin Calcium] Other (See Comments)    Stiffness and back pain  . Ezetimibe-Simvastatin Other (See Comments)    Leg cramps  . Sitagliptin Other (See Comments)    Unknown reaction  . Celebrex [Celecoxib] Rash  . Levemir [Insulin Detemir] Rash    Follow-up Information    Ronnie Doss M, DO. Schedule an appointment as soon as possible for a visit in 1 week(s).   Specialty: Family Medicine Contact information: [place] [address] [phone]        Minus Breeding, MD .   Specialty: Cardiology Contact information: [address] [place] [address] [phone]  Thompson Grayer, MD .   Specialty: Cardiology Contact information: [place] [address] [phone]                The results of significant diagnostics from this hospitalization (including imaging, microbiology, ancillary and laboratory) are listed below for reference.    Significant Diagnostic Studies: DG Chest 2 View  Result Date: 07/20/2020 CLINICAL DATA:  Shortness of breath. Left leg pain and swelling. Chronic swelling of both lower extremities. Diabetic. EXAM: CHEST - 2 VIEW COMPARISON:  None. FINDINGS: Redemonstration of an enlarged cardiac silhouette. Otherwise the heart size and mediastinal contours are unchanged. Aortic arch calcifications. No focal consolidation. No pulmonary edema. No pleural effusion. No pneumothorax. No acute osseous abnormality.  Degenerative changes of the spine. IMPRESSION: Similar-appearing enlarged cardiac silhouette with otherwise no acute cardiopulmonary disease. Electronically Signed   By: Iven Finn M.D.   On: 07/20/2020 20:00   DG Tibia/Fibula Left  Result Date: 07/21/2020 CLINICAL DATA:  Leg swelling, pain EXAM: LEFT TIBIA AND FIBULA - 2 VIEW COMPARISON:  Knee series 03/06/2019 FINDINGS: No acute bony abnormality. Specifically, no fracture, subluxation, or dislocation. Soft tissues are intact. Vascular calcifications noted. IMPRESSION: No acute bony abnormality. Electronically Signed   By: Rolm Baptise M.D.   On: 07/21/2020 11:25   CARDIAC CATHETERIZATION  Result Date: 07/06/2020 1. Elevated right and left heart filling pressures. 2. Pulmonary venous hypertension.   DG Chest Port 1 View  Result Date: 07/02/2020 CLINICAL DATA:  Shortness of breath EXAM: PORTABLE CHEST 1 VIEW COMPARISON:  June 10, 2020 FINDINGS: No  edema or airspace opacity. There is cardiomegaly with pulmonary vascularity normal. No adenopathy. No bone lesions. IMPRESSION: No edema or airspace opacity.  There is cardiomegaly. Electronically Signed   By: Lowella Grip III M.D.   On: 07/02/2020 17:27   DG Foot Complete Left  Result Date: 07/21/2020 CLINICAL DATA:  Leg swelling, pain EXAM: LEFT FOOT - COMPLETE 3+ VIEW COMPARISON:  09/26/2012 FINDINGS: Prior transmetatarsal amputations of all the toes within the left foot. No acute bony abnormality. Specifically, no fracture, subluxation, or dislocation. Soft tissues are intact. IMPRESSION: Prior transmetatarsal amputation.  No acute bony abnormality. Electronically Signed   By: Rolm Baptise M.D.   On: 07/21/2020 11:25   ECHOCARDIOGRAM COMPLETE  Result Date: 07/07/2020    ECHOCARDIOGRAM REPORT   Patient Name:   LUDMILLA MCGILLIS Date of Exam: 07/07/2020 Medical Rec #:  [id number]        Height:       66.0 in Accession #:    [id number]       Weight:       204.4 lb Date of Birth:  09-Mar-1951        BSA:          2.018 m Patient Age:    67 years         BP:           132/55 mmHg Patient Gender: F                HR:           75 bpm. Exam Location:  Inpatient Procedure: 2D Echo Indications:    CHF-Acute I50.31  History:        Patient has prior history of Echocardiogram examinations, most                 recent 03/12/2020. Risk Factors:Dyslipidemia and Hypertension.  Sonographer:    Mikki Santee  RDCS (AE) Referring Phys: [place]  1. Left ventricular ejection fraction, by estimation, is 60 to 65%. The left ventricle has normal function. The left ventricle has no regional wall motion abnormalities. There is mild concentric left ventricular hypertrophy. Diastolic function could not  be assesed due to absence of atrial contraction (junctional rhythm?).  2. Right ventricular systolic function is normal. The right ventricular size is normal. There is severely elevated pulmonary  artery systolic pressure. The estimated right ventricular systolic pressure is 34.1 mmHg.  3. Left atrial size was mildly dilated.  4. Moderate pericardial effusion. The pericardial effusion is circumferential. There is no evidence of cardiac tamponade.  5. The mitral valve is degenerative. Mild mitral valve regurgitation. Mild mitral stenosis. The mean mitral valve gradient is 5.0 mmHg. Moderate mitral annular calcification.  6. Gradients across the TAVR prosthesis have increased and dimensionless index has worsened compared to 12/10/2019. The aortic valve has been repaired/replaced. Aortic valve regurgitation is not visualized. Aortic valve mean gradient measures 23.0 mmHg.  Aortic valve Vmax measures 3.03 m/s.  7. The inferior vena cava is dilated in size with <50% respiratory variability, suggesting right atrial pressure of 15 mmHg. Comparison(s): TAVR gradients have increased and this is not related to increased cardiac output. Consider TEE if clinically indicated, for example if prosthetic leaflet thrombosis is suspected. FINDINGS  Left Ventricle: Left ventricular ejection fraction, by estimation, is 60 to 65%. The left ventricle has normal function. The left ventricle has no regional wall motion abnormalities. The left ventricular internal cavity size was normal in size. There is  mild concentric left ventricular hypertrophy. Left ventricular diastolic function could not be evaluated due to nondiagnostic images. Diastolic function could not be assesed due to absence of atrial contraction (junctional rhythm?). Right Ventricle: The right ventricular size is normal. No increase in right ventricular wall thickness. Right ventricular systolic function is normal. There is severely elevated pulmonary artery systolic pressure. The tricuspid regurgitant velocity is 3.70 m/s, and with an assumed right atrial pressure of 8 mmHg, the estimated right ventricular systolic pressure is 93.7 mmHg. Left Atrium: Left atrial  size was mildly dilated. Right Atrium: Right atrial size was normal in size. Pericardium: A moderately sized pericardial effusion is present. The pericardial effusion is circumferential. There is no evidence of cardiac tamponade. Mitral Valve: The mitral valve is degenerative in appearance. Moderate mitral annular calcification. Mild mitral valve regurgitation, with centrally-directed jet. Mild mitral valve stenosis. MV peak gradient, 19.5 mmHg. The mean mitral valve gradient is 5.0 mmHg with average heart rate of 73 bpm. Tricuspid Valve: The tricuspid valve is normal in structure. Tricuspid valve regurgitation is mild. Aortic Valve: Gradients across the TAVR prosthesis have increased and dimensionless index has worsened compared to 12/10/2019. The aortic valve has been repaired/replaced. Aortic valve regurgitation is not visualized. Aortic valve mean gradient measures 23.0 mmHg. Aortic valve peak gradient measures 36.7 mmHg. Aortic valve area, by VTI measures 1.15 cm. There is a 23 mm Sapien prosthetic, stented (TAVR) valve present in the aortic position. Pulmonic Valve: The pulmonic valve was not well visualized. Pulmonic valve regurgitation is not visualized. Aorta: The aortic root is normal in size and structure. Venous: The inferior vena cava is dilated in size with less than 50% respiratory variability, suggesting right atrial pressure of 15 mmHg. IAS/Shunts: No atrial level shunt detected by color flow Doppler.  LEFT VENTRICLE PLAX 2D LVIDd:         4.50 cm  Diastology LVIDs:  2.90 cm  LV e' lateral:   6.05 cm/s LV PW:         1.30 cm  LV E/e' lateral: 33.6 LV IVS:        1.30 cm LVOT diam:     2.10 cm LV SV:         68 LV SV Index:   34 LVOT Area:     3.46 cm  LEFT ATRIUM             Index       RIGHT ATRIUM           Index LA diam:        4.30 cm 2.13 cm/m  RA Area:     13.80 cm LA Vol (A2C):   64.7 ml 32.06 ml/m RA Volume:   27.90 ml  13.82 ml/m LA Vol (A4C):   51.7 ml 25.62 ml/m LA Biplane  Vol: 58.7 ml 29.08 ml/m  AORTIC VALVE AV Area (Vmax):    1.08 cm AV Area (Vmean):   1.02 cm AV Area (VTI):     1.15 cm AV Vmax:           303.00 cm/s AV Vmean:          233.000 cm/s AV VTI:            0.591 m AV Peak Grad:      36.7 mmHg AV Mean Grad:      23.0 mmHg LVOT Vmax:         94.50 cm/s LVOT Vmean:        68.700 cm/s LVOT VTI:          0.196 m LVOT/AV VTI ratio: 0.33  AORTA Ao Root diam: 2.70 cm MITRAL VALVE                TRICUSPID VALVE MV Area (PHT): 3.03 cm     TR Peak grad:   54.8 mmHg MV Peak grad:  19.5 mmHg    TR Vmax:        370.00 cm/s MV Mean grad:  5.0 mmHg MV Vmax:       2.21 m/s     SHUNTS MV Vmean:      94.0 cm/s    Systemic VTI:  0.20 m MV PHT:        73.97 msec   Systemic Diam: 2.10 cm MV Decel Time: 250 msec MV E velocity: 203.00 cm/s MV A velocity: 62.80 cm/s MV E/A ratio:  3.23 Mihai Croitoru MD Electronically signed by Sanda Klein MD Signature Date/Time: 07/07/2020/3:33:20 PM    Final    ECHO TEE  Result Date: 07/08/2020    TRANSESOPHOGEAL ECHO REPORT   Patient Name:   LYNNLEY DODDRIDGE Date of Exam: 07/08/2020 Medical Rec #:  [id number]        Height:       66.0 in Accession #:    [id number]       Weight:       196.0 lb Date of Birth:  [birth year]        BSA:          1.983 m Patient Age:    19 years         BP:           110/60 mmHg Patient Gender: F                HR:           86 bpm. Exam  Location:  Inpatient Procedure: Transesophageal Echo, Color Doppler and Cardiac Doppler Indications:    Aortic stenosis  History:        Patient has prior history of Echocardiogram examinations.  Sonographer:    NA Referring Phys: [id number] AMY D CLEGG PROCEDURE: The transesophogeal probe was passed without difficulty through the esophogus of the patient. Sedation performed by different physician. The patient developed no complications during the procedure. IMPRESSIONS  1. Left ventricular ejection fraction, by estimation, is 55%. The left ventricle has normal function. The left ventricle  has no regional wall motion abnormalities. There is mild left ventricular hypertrophy.  2. Right ventricular systolic function is normal. The right ventricular size is normal.  3. Peak RV-RA gradient 63 mmHg.  4. Left atrial size was mildly dilated. No left atrial/left atrial appendage thrombus was detected.  5. No tamponade. Moderate pericardial effusion.  6. Calcified mitral valve, especially posterior leaflet. There is a 1.4 cm calcified nodule attached to the posterior leaflet (seen on prior studies). This may be a sequela of remote endocarditis. There is mild to moderate mitral regurgitation and mild mitral stenosis with mean gradient 5 mmHg but short pressure halftime.  7. There was a bioprosthetic aortic valve s/p TAVR. The valve visually opens normally. Mean gradient 29 mmHg across the aortic valve, no aortic insufficiency.  8. No PFO/ASD by color doppler.  9. Normal caliber thoracic aorta with mild plaque. 10. The TAVR valve appears to open relatively normally, but has a significantly elevated mean gradient (29 mmHg). The valve is relatively small and also suspect volume overload with high flow. The valve does not appear thrombosed. FINDINGS  Left Ventricle: Left ventricular ejection fraction, by estimation, is 55%. The left ventricle has normal function. The left ventricle has no regional wall motion abnormalities. The left ventricular internal cavity size was normal in size. There is mild left ventricular hypertrophy. Right Ventricle: The right ventricular size is normal. No increase in right ventricular wall thickness. Right ventricular systolic function is normal. Left Atrium: Left atrial size was mildly dilated. No left atrial/left atrial appendage thrombus was detected. Right Atrium: Right atrial size was normal in size. Pericardium: No tamponade. A moderately sized pericardial effusion is present. Mitral Valve: Calcified mitral valve, especially posterior leaflet. There is a 1.4 cm calcified nodule  attached to the posterior leaflet (seen on prior studies). This may be sequelae of remote endocarditis. There is mild to moderate mitral regurgitation and mild mitral stenosis with mean gradient 5 mmHg but short pressure halftime. The mitral valve is abnormal. Mild to moderate mitral valve regurgitation. MV peak gradient, 15.4 mmHg. The mean mitral valve gradient is 5.0 mmHg. Tricuspid Valve: Peak RV-RA gradient 63 mmHg. The tricuspid valve is normal in structure. Tricuspid valve regurgitation is trivial. Aortic Valve: There was a bioprosthetic aortic valve s/p TAVR. The valve visually opens normally. Mean gradient 29 mmHg across the aortic valve, no aortic insufficiency. The aortic valve has been repaired/replaced. Aortic valve regurgitation is not visualized. Aortic valve mean gradient measures 27.5 mmHg. Aortic valve peak gradient measures 43.6 mmHg. Aortic valve area, by VTI measures 1.00 cm. Pulmonic Valve: The pulmonic valve was normal in structure. Pulmonic valve regurgitation is not visualized. Aorta: Normal caliber thoracic aorta with mild plaque. The aortic root is normal in size and structure. IAS/Shunts: No PFO/ASD by color doppler.  LEFT VENTRICLE PLAX 2D LVOT diam:     1.80 cm LV SV:         66 LV SV Index:  33 LVOT Area:     2.54 cm  AORTIC VALVE AV Area (Vmax):    0.96 cm AV Area (Vmean):   0.96 cm AV Area (VTI):     1.00 cm AV Vmax:           330.00 cm/s AV Vmean:          252.000 cm/s AV VTI:            0.665 m AV Peak Grad:      43.6 mmHg AV Mean Grad:      27.5 mmHg LVOT Vmax:         124.00 cm/s LVOT Vmean:        95.500 cm/s LVOT VTI:          0.260 m LVOT/AV VTI ratio: 0.39 MITRAL VALVE MV Area (PHT): 4.49 cm   SHUNTS MV Peak grad:  15.4 mmHg  Systemic VTI:  0.26 m MV Mean grad:  5.0 mmHg   Systemic Diam: 1.80 cm MV Vmax:       1.96 m/s MV Vmean:      107.0 cm/s [name] MD Electronically signed by [name] MD Signature Date/Time: 07/08/2020/3:12:31 PM    Final    VAS [place]  LOWER EXTREMITY ARTERIAL DUPLEX  Result Date: 07/06/2020 LOWER EXTREMITY ARTERIAL DUPLEX STUDY Indications: Rest pain, peripheral artery disease, and Cellulitis. High Risk Factors: Hypertension, hyperlipidemia. Other Factors: TAVR.  Vascular Interventions: . left distal SFA-TP trunk bypass and left TMA in 2/21. Current ABI:            ABI done 04/23/20 at outside facility Limitations: patient unable to turn supine secondary to pain in leg. Constant              left lower extremity spasms Comparison Study: Prior ABI done 04/23/20 Performing Technologist: Sharion Dove RVS  Examination Guidelines: A complete evaluation includes B-mode imaging, spectral Doppler, color Doppler, and power Doppler as needed of all accessible portions of each vessel. Bilateral testing is considered an integral part of a complete examination. Limited examinations for reoccurring indications may be performed as noted.  +----------+--------+-----+--------+-----------+---------------+ LEFT      PSV cm/sRatioStenosisWaveform   Comments        +----------+--------+-----+--------+-----------+---------------+ CFA Distal105                  multiphasic                +----------+--------+-----+--------+-----------+---------------+ DFA       35                                              +----------+--------+-----+--------+-----------+---------------+ SFA Prox  80                                              +----------+--------+-----+--------+-----------+---------------+ SFA Mid   86                                              +----------+--------+-----+--------+-----------+---------------+ SFA Distal31  thumping signal +----------+--------+-----+--------+-----------+---------------+ POP Prox  9            occluded                           +----------+--------+-----+--------+-----------+---------------+ ATA Distal             occluded                            +----------+--------+-----+--------+-----------+---------------+ PTA Distal             occluded                           +----------+--------+-----+--------+-----------+---------------+  Summary: Left: Limited study. Possible collateral noted at distal femoral artery. Popliteal artery appears occluded. Distal PT and AT appear occluded. Unable to visualize bypass graft.  See table(s) above for measurements and observations. Electronically signed by Monica Martinez MD on 07/06/2020 at 4:59:12 PM.    Final    VAS [place] LOWER EXTREMITY VENOUS (DVT) (ONLY [facility] & [facility])  Result Date: 07/21/2020  Lower Venous DVT Study Indications: Pain.  Limitations: Body habitus, poor ultrasound/tissue interface, open wound, musculoskeletal features and poor patient cooperation, patient unable to tolerate some of exam. Comparison Study: 06/18/2020- negative left lower extremity venous duplex Performing Technologist: Maudry Mayhew MHA, RDMS, RVT, RDCS  Examination Guidelines: A complete evaluation includes B-mode imaging, spectral Doppler, color Doppler, and power Doppler as needed of all accessible portions of each vessel. Bilateral testing is considered an integral part of a complete examination. Limited examinations for reoccurring indications may be performed as noted. The reflux portion of the exam is performed with the patient in reverse Trendelenburg.  Right Technical Findings: Not visualized segments include CFV.  +---------+---------------+---------+-----------+----------+--------------+ LEFT     CompressibilityPhasicitySpontaneityPropertiesThrombus Aging +---------+---------------+---------+-----------+----------+--------------+ CFV      Full           Yes      Yes                                 +---------+---------------+---------+-----------+----------+--------------+ FV Prox  Full                                                         +---------+---------------+---------+-----------+----------+--------------+ FV Mid   Full                                                        +---------+---------------+---------+-----------+----------+--------------+ FV DistalFull                                                        +---------+---------------+---------+-----------+----------+--------------+ PFV      Full                                                        +---------+---------------+---------+-----------+----------+--------------+  POP      Full           Yes      Yes                                 +---------+---------------+---------+-----------+----------+--------------+   Left Technical Findings: Not visualized segments include SFJ, PTV, peroneal veins.   Summary: LEFT: - There is no evidence of deep vein thrombosis in the lower extremity. However, portions of this examination were limited- see technologist comments above.  - No cystic structure found in the popliteal fossa.  *See table(s) above for measurements and observations. Electronically signed by Curt Jews MD on 07/21/2020 at 1:49:47 PM.    Final     Microbiology: Recent Results (from the past 240 hour(s))  Culture, blood (Routine x 2)     Status: None (Preliminary result)   Collection Time: 07/20/20  3:45 PM   Specimen: BLOOD  Result Value Ref Range Status   Specimen Description BLOOD RIGHT ANTECUBITAL  Final   Special Requests   Final    BOTTLES DRAWN AEROBIC AND ANAEROBIC Blood Culture adequate volume   Culture   Final    NO GROWTH 3 DAYS Performed at [facility], [address] [place], [place] [address]    Report Status PENDING  Incomplete  Culture, blood (Routine x 2)     Status: None (Preliminary result)   Collection Time: 07/20/20  3:50 PM   Specimen: BLOOD RIGHT ARM  Result Value Ref Range Status   Specimen Description BLOOD RIGHT ARM  Final   Special Requests   Final    BOTTLES DRAWN AEROBIC AND ANAEROBIC Blood  Culture adequate volume   Culture   Final    NO GROWTH 3 DAYS Performed at [facility], [place] [address] [place], [place] [address]    Report Status PENDING  Incomplete  Resp Panel by RT-PCR (Flu A&B, Covid) Nasopharyngeal Swab     Status: None   Collection Time: 07/21/20 12:19 PM   Specimen: Nasopharyngeal Swab; Nasopharyngeal(NP) swabs in vial transport medium  Result Value Ref Range Status   SARS Coronavirus 2 by RT PCR NEGATIVE NEGATIVE Final    Comment: (NOTE) SARS-CoV-2 target nucleic acids are NOT DETECTED.  The SARS-CoV-2 RNA is generally detectable in upper respiratory specimens during the acute phase of infection. The lowest concentration of SARS-CoV-2 viral copies this assay can detect is 138 copies/mL. A negative result does not preclude SARS-Cov-2 infection and should not be used as the sole basis for treatment or other patient management decisions. A negative result may occur with  improper specimen collection/handling, submission of specimen other than nasopharyngeal swab, presence of viral mutation(s) within the areas targeted by this assay, and inadequate number of viral copies(<138 copies/mL). A negative result must be combined with clinical observations, patient history, and epidemiological information. The expected result is Negative.  Fact Sheet for Patients:  EntrepreneurPulse.com.au  Fact Sheet for Healthcare Providers:  IncredibleEmployment.be  This test is no t yet approved or cleared by the [place] FDA and  has been authorized for detection and/or diagnosis of SARS-CoV-2 by FDA under an Emergency Use Authorization (EUA). This EUA will remain  in effect (meaning this test can be used) for the duration of the COVID-19 declaration under Section 564(b)(1) of the Act, 21 U.S.C.section 360bbb-3(b)(1), unless the authorization is terminated  or revoked sooner.       Influenza  A by PCR NEGATIVE NEGATIVE Final    Influenza B by PCR NEGATIVE NEGATIVE Final    Comment: (NOTE) The Xpert Xpress SARS-CoV-2/FLU/RSV plus assay is intended as an aid in the diagnosis of influenza from Nasopharyngeal swab specimens and should not be used as a sole basis for treatment. Nasal washings and aspirates are unacceptable for Xpert Xpress SARS-CoV-2/FLU/RSV testing.  Fact Sheet for Patients: EntrepreneurPulse.com.au  Fact Sheet for Healthcare Providers: IncredibleEmployment.be  This test is not yet approved or cleared by the [place] FDA and has been authorized for detection and/or diagnosis of SARS-CoV-2 by FDA under an Emergency Use Authorization (EUA). This EUA will remain in effect (meaning this test can be used) for the duration of the COVID-19 declaration under Section 564(b)(1) of the Act, 21 U.S.C. section 360bbb-3(b)(1), unless the authorization is terminated or revoked.  Performed at [facility], [place] [address], [place], [place] [address]   MRSA PCR Screening     Status: None   Collection Time: 07/22/20 12:38 AM   Specimen: Nasopharyngeal  Result Value Ref Range Status   MRSA by PCR NEGATIVE NEGATIVE Final    Comment:        The GeneXpert MRSA Assay (FDA approved for NASAL specimens only), is one component of a comprehensive MRSA colonization surveillance program. It is not intended to diagnose MRSA infection nor to guide or monitor treatment for MRSA infections. Performed at [facility], [place] [address] [place], [place] [address]      Labs: Basic Metabolic Panel: Recent Labs  Lab 07/20/20 1545 07/22/20 0627 07/23/20 0307  NA 138 140 135  K 3.9 4.0 3.7  CL 95* 101 96*  CO2 31 29 24   GLUCOSE 180* 84 249*  BUN 47* 52* 67*  CREATININE 2.42* 2.06* 2.90*  CALCIUM 9.0 8.3* 8.2*   Liver Function Tests: Recent Labs  Lab 07/20/20 1545  AST 50*  ALT 51*  ALKPHOS 64  BILITOT 1.6*  PROT 6.8  ALBUMIN 2.8*   No results  for input(s): LIPASE, AMYLASE in the last 168 hours. No results for input(s): AMMONIA in the last 168 hours. CBC: Recent Labs  Lab 07/20/20 1545 07/22/20 0627 07/23/20 0307  WBC 11.7* 8.6 8.3  NEUTROABS 10.0*  --  6.1  HGB 10.8* 9.5* 9.5*  HCT 38.1 33.5* 32.0*  MCV 85.6 86.3 83.8  PLT 303 278 284   Cardiac Enzymes: No results for input(s): CKTOTAL, CKMB, CKMBINDEX, TROPONINI in the last 168 hours. BNP: BNP (last 3 results) Recent Labs    02/21/20 1151 06/03/20 1354 07/02/20 1628  BNP 538.2* 718.7* 1,102.7*    ProBNP (last 3 results) No results for input(s): PROBNP in the last 8760 hours.  CBG: Recent Labs  Lab 07/22/20 1146 07/22/20 1602 07/22/20 2138 07/23/20 0647 07/23/20 1207  GLUCAP 189* 283* 400* 180* 231*       Signed:  Alma Friendly, MD [facility] 07/23/2020, 1:14 PM

## 2020-07-23 NOTE — Evaluation (Signed)
Physical Therapy Evaluation Patient Details Name: [name] MRN: [id number] DOB: 01-Feb-1951 Today's Date: 07/23/2020   History of Present Illness  Pt is a 69 y.o. female admitted 07/20/20 with LLE swelling and pain; workup for acute LLE cellulitis. PMH includes L transmet amputation, obesity, CHF, PAF, CKD IV, DM, PVD, aortic stenosis s/p TAVR.    Clinical Impression  Pt presents with an overall decrease in functional mobility secondary to above. PTA, pt requires intermittent assist from husband for standing transfers with RW to w/c, husband assists with ADLs/iADLs. Today, pt requiring up to minA for stand pivot transfers and assist for UB/LB ADL tasks. Pt hopeful for d/c today and reports husband able to provide necessary level of assist. Pt owns DME and currently active with [facility] services. If to remain admitted, will follow acutely to address established goals.     Follow Up Recommendations Home health PT;Supervision for mobility/OOB    Equipment Recommendations  None recommended by PT    Recommendations for Other Services       Precautions / Restrictions Precautions Precautions: Fall;Other (comment) Precaution Comments: Urine incontinence (wears Depends) Restrictions Weight Bearing Restrictions: No      Mobility  Bed Mobility Overal bed mobility: Needs Assistance Bed Mobility: Supine to Sit     Supine to sit: Min assist;HOB elevated     General bed mobility comments: MinA for HHA to elevate trunk    Transfers Overall transfer level: Needs assistance Equipment used: Rolling walker (2 wheeled) Transfers: Sit to/from Stand Sit to Stand: Min assist;Min guard         General transfer comment: Initial minA to power into standing from EOB to RW; able to stand 2x more from recliner to RW with min guard, reliant on arm rest support  Ambulation/Gait Ambulation/Gait assistance: Min assist Gait Distance (Feet): 1 Feet Assistive device: Rolling walker (2 wheeled) Gait  Pattern/deviations: Step-to pattern;Decreased weight shift to left;Antalgic;Trunk flexed     General Gait Details: Pivotal steps from bed to recliner with RW and minA for balance; quick to fatigue with DOE 3/4 requiring seated rest  Stairs            Wheelchair Mobility    Modified Rankin (Stroke Patients Only)       Balance Overall balance assessment: Needs assistance Sitting-balance support: Feet supported Sitting balance-Leahy Scale: Fair     Standing balance support: Bilateral upper extremity supported;No upper extremity supported;Single extremity supported Standing balance-Leahy Scale: Poor Standing balance comment: Reliant on single UE support when standing to don Depends                             Pertinent Vitals/Pain Pain Assessment: Faces Faces Pain Scale: Hurts little more Pain Location: back, LLE Pain Descriptors / Indicators: Discomfort;Guarding Pain Intervention(s): Monitored during session;Limited activity within patient's tolerance    Home Living Family/patient expects to be discharged to:: Private residence Living Arrangements: Spouse/significant other Available Help at Discharge: Family Type of Home: House Home Access: Stairs to enter Entrance Stairs-Rails: None Entrance Stairs-Number of Steps: 1 Home Layout: One level Home Equipment: Walker - standard;Wheelchair - [employer];Tub bench      Prior Function Level of Independence: Needs assistance   Gait / Transfers Assistance Needed: Typically able to perform stand pivot transfers with RW to [employer] and w/c, has required increased assist from husband recently due to painful LLE. Typically able to ascend 1 step into home with RW, but husband has bumped  up in w/c  ADL's / Homemaking Assistance Needed: Pt reports husband assists with ADL and IADL tasks (washes back and manages water for her, helps with socks, shoes, and starting underwear/pants; pt wears Depends at home)         Hand Dominance   Dominant Hand: Right    Extremity/Trunk Assessment   Upper Extremity Assessment Upper Extremity Assessment: Generalized weakness    Lower Extremity Assessment Lower Extremity Assessment: Generalized weakness;LLE deficits/detail LLE Deficits / Details: h/o L transmet amputation; now with L lower leg/foot cellulitis and scabbed wound on L shin       Communication   Communication: HOH  Cognition Arousal/Alertness: Awake/alert Behavior During Therapy: WFL for tasks assessed/performed Overall Cognitive Status: Within Functional Limits for tasks assessed                                        General Comments General comments (skin integrity, edema, etc.): DOE up to 3/4 with activity, SpO2 96% on RA    Exercises     Assessment/Plan    PT Assessment Patient needs continued PT services  PT Problem List Decreased strength;Decreased activity tolerance;Decreased balance;Decreased mobility;Pain       PT Treatment Interventions DME instruction;Functional mobility training;Therapeutic activities;Therapeutic exercise;Balance training;Patient/family education;Wheelchair mobility training;Gait training    PT Goals (Current goals can be found in the Care Plan section)  Acute Rehab PT Goals Patient Stated Goal: Home today PT Goal Formulation: With patient Time For Goal Achievement: 08/06/20 Potential to Achieve Goals: Good    Frequency Min 3X/week   Barriers to discharge        Co-evaluation               AM-PAC PT "6 Clicks" Mobility  Outcome Measure Help needed turning from your back to your side while in a flat bed without using bedrails?: A Little Help needed moving from lying on your back to sitting on the side of a flat bed without using bedrails?: A Little Help needed moving to and from a bed to a chair (including a wheelchair)?: A Little Help needed standing up from a chair using your arms (e.g., wheelchair or bedside  chair)?: A Little Help needed to walk in hospital room?: A Little Help needed climbing 3-5 steps with a railing? : A Lot 6 Click Score: 17    End of Session Equipment Utilized During Treatment: Gait belt Activity Tolerance: Patient tolerated treatment well;Patient limited by fatigue Patient left: in chair;with call bell/phone within reach;with chair alarm set Nurse Communication: Mobility status PT Visit Diagnosis: Unsteadiness on feet (R26.81);Muscle weakness (generalized) (M62.81);Pain Pain - Right/Left: Left Pain - part of body: Leg    Time: 0902-0923 PT Time Calculation (min) (ACUTE ONLY): 21 min   Charges:   PT Evaluation $PT Eval Moderate Complexity: 1 Mod     [name], PT, DPT Acute Rehabilitation Services  Pager [phone] Office [phone]  Derry Lory 07/23/2020, 11:53 AM

## 2020-07-23 NOTE — Plan of Care (Signed)
  Problem: Education: Goal: Knowledge of General Education information will improve Description: Including pain rating scale, medication(s)/side effects and non-pharmacologic comfort measures 07/23/2020 1848 by Phebe Colla, RN Outcome: Completed/Met 07/23/2020 1848 by Phebe Colla, RN Outcome: Adequate for Discharge   Problem: Health Behavior/Discharge Planning: Goal: Ability to manage health-related needs will improve 07/23/2020 1848 by Phebe Colla, RN Outcome: Completed/Met 07/23/2020 1848 by Phebe Colla, RN Outcome: Adequate for Discharge   Problem: Clinical Measurements: Goal: Ability to maintain clinical measurements within normal limits will improve 07/23/2020 1848 by Phebe Colla, RN Outcome: Completed/Met 07/23/2020 1848 by Phebe Colla, RN Outcome: Adequate for Discharge Goal: Will remain free from infection 07/23/2020 1848 by Phebe Colla, RN Outcome: Completed/Met 07/23/2020 1848 by Phebe Colla, RN Outcome: Adequate for Discharge Goal: Diagnostic test results will improve 07/23/2020 1848 by Phebe Colla, RN Outcome: Completed/Met 07/23/2020 1848 by Phebe Colla, RN Outcome: Adequate for Discharge Goal: Respiratory complications will improve 07/23/2020 1848 by Phebe Colla, RN Outcome: Completed/Met 07/23/2020 1848 by Phebe Colla, RN Outcome: Adequate for Discharge Goal: Cardiovascular complication will be avoided 07/23/2020 1848 by Phebe Colla, RN Outcome: Completed/Met 07/23/2020 1848 by Phebe Colla, RN Outcome: Adequate for Discharge   Problem: Activity: Goal: Risk for activity intolerance will decrease 07/23/2020 1848 by Phebe Colla, RN Outcome: Completed/Met 07/23/2020 1848 by Phebe Colla, RN Outcome: Adequate for Discharge   Problem: Nutrition: Goal: Adequate nutrition will be maintained 07/23/2020 1848 by Phebe Colla, RN Outcome:  Completed/Met 07/23/2020 1848 by Phebe Colla, RN Outcome: Adequate for Discharge   Problem: Coping: Goal: Level of anxiety will decrease 07/23/2020 1848 by Phebe Colla, RN Outcome: Completed/Met 07/23/2020 1848 by Phebe Colla, RN Outcome: Adequate for Discharge   Problem: Elimination: Goal: Will not experience complications related to bowel motility 07/23/2020 1848 by Phebe Colla, RN Outcome: Completed/Met 07/23/2020 1848 by Phebe Colla, RN Outcome: Adequate for Discharge Goal: Will not experience complications related to urinary retention 07/23/2020 1848 by Phebe Colla, RN Outcome: Completed/Met 07/23/2020 1848 by Phebe Colla, RN Outcome: Adequate for Discharge   Problem: Pain Managment: Goal: General experience of comfort will improve 07/23/2020 1848 by Phebe Colla, RN Outcome: Completed/Met 07/23/2020 1848 by Phebe Colla, RN Outcome: Adequate for Discharge   Problem: Safety: Goal: Ability to remain free from injury will improve 07/23/2020 1848 by Phebe Colla, RN Outcome: Completed/Met 07/23/2020 1848 by Phebe Colla, RN Outcome: Adequate for Discharge   Problem: Skin Integrity: Goal: Risk for impaired skin integrity will decrease 07/23/2020 1848 by Phebe Colla, RN Outcome: Completed/Met 07/23/2020 1848 by Phebe Colla, RN Outcome: Adequate for Discharge   Problem: Acute Rehab PT Goals(only PT should resolve) Goal: Pt Will Go Supine/Side To Sit Outcome: Completed/Met Goal: Patient Will Transfer Sit To/From Stand Outcome: Completed/Met Goal: Pt Will Transfer Bed To Chair/Chair To Bed Outcome: Completed/Met

## 2020-07-23 NOTE — Progress Notes (Signed)
Pt was given her AVS and went over with her. IV was removed with catheter intact. Pt had no further questions. Pt husband here to take her home.

## 2020-07-24 ENCOUNTER — Telehealth: Payer: Self-pay

## 2020-07-24 NOTE — Telephone Encounter (Signed)
Contact Date: 07/24/2020 Contacted By: Nigel Berthold  Transition Care Management Follow-up Telephone Call  Date of discharge and from where: 07/23/20- [facility]  Discharge Diagnosis:Cellilitis left lower extremity   How have you been since you were released from the hospital?   Any questions or concerns? No   Items Reviewed:  Did the pt receive and understand the discharge instructions provided? Yes   Medications obtained and verified? Yes   Any new allergies since your discharge? No   Dietary orders reviewed? Yes  Do you have support at home? Yes   Discontinued Medications None  New Medications Added Keflex  Current Medication List Allergies as of 07/24/2020      Reactions   Amlodipine Swelling   Ankle swelling   Atorvastatin Other (See Comments)   Myalgias   Crestor [rosuvastatin Calcium] Other (See Comments)   Stiffness and back pain   Ezetimibe-simvastatin Other (See Comments)   Leg cramps   Sitagliptin Other (See Comments)   Unknown reaction   Celebrex [celecoxib] Rash   Levemir [insulin Detemir] Rash      Medication List       Accurate as of July 24, 2020 10:38 AM. If you have any questions, ask your nurse or doctor.        Accu-Chek Aviva Plus test strip Generic drug: glucose blood USE TO CHECK BLOOD SUGAR UP TO TWICE DAILY OR AS INSTRUCTED What changed: See the new instructions.   amiodarone 200 MG tablet Commonly known as: PACERONE Take 1 tablet 2 times a day for 7 days, then reduce down to 1 tablet once daily What changed:   how much to take  how to take this  when to take this  additional instructions   B-D INS SYR ULTRAFINE 1CC/31G 31G X 5/16" 1 ML Misc Generic drug: Insulin Syringe-Needle U-100 USE TO INJECT INSULIN TWICE A DAY AS INSTRUCTED What changed: See the new instructions.   ReliOn Insulin Syringe 31G X 15/64" 1 ML Misc Generic drug: Insulin Syringe-Needle U-100 Inject 1 Syringe into the skin 3  (three) times daily. What changed: Another medication with the same name was changed. Make sure you understand how and when to take each.   benzonatate 100 MG capsule Commonly known as: Tessalon Perles Take 1 capsule (100 mg total) by mouth 3 (three) times daily as needed. What changed: reasons to take this   CALCIUM 600 PO Take 1 tablet by mouth daily.   cephALEXin 500 MG capsule Commonly known as: KEFLEX Take 1 capsule (500 mg total) by mouth 2 (two) times daily for 5 days.   docusate sodium 50 MG capsule Commonly known as: COLACE Take 2 capsules (100 mg total) by mouth 2 (two) times daily. What changed: when to take this   Eliquis 5 MG Tabs tablet Generic drug: apixaban TAKE 1 TABLET BY MOUTH TWICE A DAY What changed: how much to take   ferrous sulfate 325 (65 FE) MG tablet Commonly known as: FerrouSul Take 1 tablet (325 mg total) by mouth 2 (two) times daily with a meal.   hydrALAZINE 50 MG tablet Commonly known as: APRESOLINE Take 50 mg by mouth 3 (three) times daily.   insulin NPH Human 100 UNIT/ML injection Commonly known as: NOVOLIN N Inject 0.25 mLs (25 Units total) into the skin 2 (two) times daily at 8 am and 10 pm. What changed:   how much to take  when to take this   Insulin Pen Needle 32G X 4 MM Misc Use  to inject insulin with insulin pen   Livalo 4 MG Tabs Generic drug: Pitavastatin Calcium Take 4 mg by mouth daily.   loratadine 10 MG tablet Commonly known as: CLARITIN Take 10 mg by mouth daily.   meclizine 12.5 MG tablet Commonly known as: ANTIVERT Take 1 tablet (12.5 mg total) by mouth 3 (three) times daily as needed for dizziness.   MULTIVITAMIN GUMMIES ADULT PO Take 2 each by mouth daily.   NovoLIN R ReliOn 100 units/mL injection Generic drug: insulin regular Inject 20-30 Units into the skin 2 (two) times daily before a meal. Per sliding scale   nutrition supplement (JUVEN) Pack Take 1 packet by mouth 2 (two) times daily between  meals.   pantoprazole 40 MG tablet Commonly known as: PROTONIX Take 1 tablet (40 mg total) by mouth daily.   Praluent 75 MG/ML Soaj Generic drug: Alirocumab Inject 75 mg into the skin every 14 (fourteen) days.   torsemide 20 MG tablet Commonly known as: DEMADEX Take 40 mg (2 tablets) every morning and 20 mg (1 tablet) every evening What changed:   how much to take  how to take this  when to take this  additional instructions   triamcinolone 0.1 % Commonly known as: KENALOG Apply 1 application topically daily.   Vascepa 1 g capsule Generic drug: icosapent Ethyl TAKE 2 CAPSULES (2 G TOTAL) BY MOUTH 2 (TWO) TIMES A DAY. What changed: See the new instructions.   VITAMIN D3 PO Take 1 tablet by mouth daily.   Vyndamax 61 MG Caps Generic drug: Tafamidis Take 61 mg by mouth daily.        Home Care and Equipment/Supplies: Were home health services ordered? yes If so, what is the name of the agency? [facility]  Has the agency set up a time to come to the patient's home? no Were any new equipment or medical supplies ordered?  No What is the name of the medical supply agency? N/A Were you able to get the supplies/equipment? not applicable Do you have any questions related to the use of the equipment or supplies? No  Functional Questionnaire: (I = Independent and D = Dependent) ADLs: I  Bathing/Dressing- I  Meal Prep- D  Eating- I  Maintaining continence- I  Transferring/Ambulation- I  Managing Meds- I  Follow up appointments reviewed:   PCP Hospital f/u appt confirmed? Yes  Scheduled to see 4pm on 07/29/2020 @ [facility].  [facility] f/u appt confirmed? N/A  Are transportation arrangements needed? No   If their condition worsens, is the pt aware to call PCP or go to the Emergency Dept.? No  Was the patient provided with contact information for the PCP's office or ED? Yes  Was to pt encouraged to call back with questions or concerns? Yes

## 2020-07-25 LAB — CULTURE, BLOOD (ROUTINE X 2)
Culture: NO GROWTH
Culture: NO GROWTH
Special Requests: ADEQUATE
Special Requests: ADEQUATE

## 2020-07-27 ENCOUNTER — Ambulatory Visit: Payer: Medicare Other | Admitting: *Deleted

## 2020-07-27 DIAGNOSIS — E7800 Pure hypercholesterolemia, unspecified: Secondary | ICD-10-CM | POA: Diagnosis not present

## 2020-07-27 DIAGNOSIS — E1159 Type 2 diabetes mellitus with other circulatory complications: Secondary | ICD-10-CM

## 2020-07-27 DIAGNOSIS — N189 Chronic kidney disease, unspecified: Secondary | ICD-10-CM | POA: Diagnosis not present

## 2020-07-27 DIAGNOSIS — E1165 Type 2 diabetes mellitus with hyperglycemia: Secondary | ICD-10-CM | POA: Diagnosis not present

## 2020-07-27 DIAGNOSIS — Z794 Long term (current) use of insulin: Secondary | ICD-10-CM

## 2020-07-27 DIAGNOSIS — I152 Hypertension secondary to endocrine disorders: Secondary | ICD-10-CM

## 2020-07-27 DIAGNOSIS — I5033 Acute on chronic diastolic (congestive) heart failure: Secondary | ICD-10-CM | POA: Diagnosis not present

## 2020-07-27 DIAGNOSIS — D631 Anemia in chronic kidney disease: Secondary | ICD-10-CM | POA: Diagnosis not present

## 2020-07-27 DIAGNOSIS — R809 Proteinuria, unspecified: Secondary | ICD-10-CM | POA: Diagnosis not present

## 2020-07-27 DIAGNOSIS — N1832 Chronic kidney disease, stage 3b: Secondary | ICD-10-CM | POA: Diagnosis not present

## 2020-07-27 DIAGNOSIS — I0981 Rheumatic heart failure: Secondary | ICD-10-CM | POA: Diagnosis not present

## 2020-07-27 DIAGNOSIS — L03116 Cellulitis of left lower limb: Secondary | ICD-10-CM

## 2020-07-27 DIAGNOSIS — E1122 Type 2 diabetes mellitus with diabetic chronic kidney disease: Secondary | ICD-10-CM | POA: Diagnosis not present

## 2020-07-27 DIAGNOSIS — N184 Chronic kidney disease, stage 4 (severe): Secondary | ICD-10-CM

## 2020-07-27 DIAGNOSIS — I130 Hypertensive heart and chronic kidney disease with heart failure and stage 1 through stage 4 chronic kidney disease, or unspecified chronic kidney disease: Secondary | ICD-10-CM | POA: Diagnosis not present

## 2020-07-27 DIAGNOSIS — I10 Essential (primary) hypertension: Secondary | ICD-10-CM | POA: Diagnosis not present

## 2020-07-27 DIAGNOSIS — E669 Obesity, unspecified: Secondary | ICD-10-CM | POA: Diagnosis not present

## 2020-07-27 NOTE — Patient Instructions (Signed)
Visit Information  Goals Addressed            This Visit's Progress    Manage Cellulitis       Patient Activities:  Keep area clean and dry and change dressing as directed  Take antibiotics as directed until complete  Monitor for s/s of worsening or recurrent infection  Reach out to PCP or seek emergency medical attention for any new or worsening symptoms       The patient verbalized understanding of instructions, educational materials, and care plan provided today and declined offer to receive copy of patient instructions, educational materials, and care plan.   Follow-up Plan:  Telephone follow up appointment with care management team member scheduled for: 08/05/20 with RN Care Manager The patient has been provided with contact information for the care management team and has been advised to call with any health related questions or concerns.  The patient will call [phone] as advised to report any new or worsening symptoms of infection.  Next PCP appointment scheduled for: 07/29/20 with Dr Lajuana Ripple   Chong Sicilian, BSN, RN-BC [facility] Management Direct Dial: [phone]

## 2020-07-27 NOTE — Chronic Care Management (AMB) (Signed)
Chronic Care Management   Follow Up Note   07/27/2020 Name: [name]  Referred by: [name], DO Reason for referral : No chief complaint on file.   [name] is a 69 y.o. year old female who is a primary care patient of [name], DO. The CCM team was consulted for assistance with chronic disease management and care coordination needs.    Review of patient status, including review of consultants reports, relevant laboratory and other test results, and collaboration with appropriate care team members and the patient's provider was performed as part of comprehensive patient evaluation and provision of chronic care management services.    SDOH (Social Determinants of Health) assessments performed: No See Care Plan activities for detailed interventions related to [facility])     Outpatient Encounter Medications as of 07/27/2020  Medication Sig Note  . ACCU-CHEK AVIVA PLUS test strip USE TO CHECK BLOOD SUGAR UP TO TWICE DAILY OR AS INSTRUCTED (Patient taking differently: 1 each by Other route 2 (two) times daily. )   . Alirocumab (PRALUENT) 75 MG/ML SOAJ Inject 75 mg into the skin every 14 (fourteen) days.   Marland Kitchen amiodarone (PACERONE) 200 MG tablet Take 1 tablet 2 times a day for 7 days, then reduce down to 1 tablet once daily (Patient taking differently: Take 200 mg by mouth See admin instructions. Take 1 tablet (200 mg totally) by mouth 2 times a day for 7 days; then takes 1 tablet (200 mg totally) by mouth daily) 07/21/2020: Started on 07/14/2020  . B-D INS SYR ULTRAFINE 1CC/31G 31G X 5/16" 1 ML MISC USE TO INJECT INSULIN TWICE A DAY AS INSTRUCTED (Patient taking differently: 1 Stick by Other route 3 (three) times daily before meals. )   . benzonatate (TESSALON PERLES) 100 MG capsule Take 1 capsule (100 mg total) by mouth 3 (three) times daily as needed. (Patient taking differently: Take 100 mg by mouth 3 (three) times daily as needed  for cough. )   . Calcium Carbonate (CALCIUM 600 PO) Take 1 tablet by mouth daily.   . cephALEXin (KEFLEX) 500 MG capsule Take 1 capsule (500 mg total) by mouth 2 (two) times daily for 5 days.   . Cholecalciferol (VITAMIN D3 PO) Take 1 tablet by mouth daily.    Marland Kitchen docusate sodium (COLACE) 50 MG capsule Take 2 capsules (100 mg total) by mouth 2 (two) times daily. (Patient taking differently: Take 100 mg by mouth daily. )   . ELIQUIS 5 MG TABS tablet TAKE 1 TABLET BY MOUTH TWICE A DAY (Patient taking differently: Take 5 mg by mouth 2 (two) times daily. )   . ferrous sulfate (FERROUSUL) 325 (65 FE) MG tablet Take 1 tablet (325 mg total) by mouth 2 (two) times daily with a meal.   . hydrALAZINE (APRESOLINE) 50 MG tablet Take 50 mg by mouth 3 (three) times daily.   . insulin NPH Human (NOVOLIN N) 100 UNIT/ML injection Inject 0.25 mLs (25 Units total) into the skin 2 (two) times daily at 8 am and 10 pm. (Patient taking differently: Inject 50 Units into the skin in the morning and at bedtime. )   . Insulin Pen Needle 32G X 4 MM MISC Use to inject insulin with insulin pen   . loratadine (CLARITIN) 10 MG tablet Take 10 mg by mouth daily.    . meclizine (ANTIVERT) 12.5 MG tablet Take 1 tablet (12.5 mg total) by mouth 3 (three) times daily as  needed for dizziness.   . Multiple Vitamins-Minerals (MULTIVITAMIN GUMMIES ADULT PO) Take 2 each by mouth daily.    Marland Kitchen NOVOLIN R RELION 100 UNIT/ML injection Inject 20-30 Units into the skin 2 (two) times daily before a meal. Per sliding scale   . nutrition supplement, JUVEN, (JUVEN) PACK Take 1 packet by mouth 2 (two) times daily between meals.   . pantoprazole (PROTONIX) 40 MG tablet Take 1 tablet (40 mg total) by mouth daily.   . Pitavastatin Calcium (LIVALO) 4 MG TABS Take 4 mg by mouth daily.    Marland Kitchen RELION INSULIN SYRINGE 31G X 15/64" 1 ML MISC Inject 1 Syringe into the skin 3 (three) times daily.   . Tafamidis (VYNDAMAX) 61 MG CAPS Take 61 mg by mouth daily.   Marland Kitchen  torsemide (DEMADEX) 20 MG tablet Take 40 mg (2 tablets) every morning and 20 mg (1 tablet) every evening (Patient taking differently: Take 20 mg by mouth 2 (two) times daily. )   . triamcinolone (KENALOG) 0.1 % Apply 1 application topically daily.   Marland Kitchen VASCEPA 1 g capsule TAKE 2 CAPSULES (2 G TOTAL) BY MOUTH 2 (TWO) TIMES A DAY. (Patient taking differently: Take 2 g by mouth 2 (two) times daily. )   . [DISCONTINUED] Olmesartan-Amlodipine-HCTZ (TRIBENZOR) 40-5-12.5 MG TABS Take 1 tablet by mouth daily.      No facility-administered encounter medications on file as of 07/27/2020.        Patient Care Plan: Heart Failure (Adult)    Problem Identified: Heart Failure Management   Priority: High  Note:   CHF in a patient with Afib, HTN, CAD, DM, CKD stage 4, HLD, osteoporosis    Long-Range Goal: Comorbidities Identified and Managed   Recent Progress: Not on track  Priority: High  Note:   Current Barriers:  Marland Kitchen Knowledge deficit related to basic heart failure pathophysiology and self care management . Impaired mobility  Case Manager Clinical Goal(s):  Marland Kitchen Over the next 30 days, patient will weigh self daily and record . Over the next 90 days, patient will take all Heart Failure mediations as prescribed . Over the next 30 days, patient will weigh daily and record (notifying MD of 3 lb weight gain over night or 5 lb in a week)  Interventions:  . Chart reviewed including recent office notes, hospital notes, lab results, and imaging studies . Basic overview and discussion of pathophysiology of Heart Failure reviewed  . Advised patient to weigh each morning after emptying bladder . Discussed importance of daily weight and advised patient to weigh and record daily . Reviewed role of diuretics in prevention of fluid overload and management of heart failure  Assessed and addressed signs/symptoms of comorbidity, including dyslipidemia, diabetes, iron deficiency, gout, arthritis, dysrhythmia,  hypertension, cachexia, coronary artery disease, kidney dysfunction and lung disease.   Educated to monitor side effects of medications and anticipate need for periodic adjustments.   Appointments reviewed and discussed  PCP 07/29/20  Cardiology 08/21/2020  Encouraged patient to reach out to [facility] as needed  [facility] and PT services that will help with wound management and strength and mobility  Patient Self Care Activities:  . Takes Heart Failure Medications as prescribed . Weighs daily and record (notifying MD of 3 lb weight gain over night or 5 lb in a week) . Verbalizes understanding of and follows CHF Action Plan . call office if I gain more than 2 pounds in one day or 5 pounds in one week .  keep legs up while sitting . track weight in diary . use salt in moderation . watch for swelling in feet, ankles and legs every day . weigh myself daily  . develop a rescue plan . eat more whole grains, fruits and vegetables, lean meats and healthy fats . follow rescue plan if symptoms flare-up . know when to call the doctor . track symptoms and what helps feel better or worse     Patient Care Plan: Hypertension (Adult)    Problem Identified: Hypertension (Hypertension)   Priority: High  Note:   Hypertension in a patient with CHF, Afib,CAD, DM, CKD stage 4, HLD, osteoporosis    Problem Identified: Disease Progression (Hypertension)     Long-Range Goal: Disease Monitored & Progression Prevented or Minimized   This Visit's Progress: Not on track  Priority: High  Note:   Objective: Last practice recorded BP readings:  BP Readings from Last 3 Encounters:  07/13/20 (!) 115/52  07/02/20 (!) 144/72  06/24/20 (!) 142/56   Current Barriers:  . Impaired mobility . Episodes of hypotension  Case Manager Clinical Goal(s):  Marland Kitchen Over the next 30 days, patient will verbalize understanding of plan for hypertension management . Over the next 90 days, patient  will monitor blood pressure daily and call PCP or cardiologist with any readings outside of recommended range  Interventions:  . Evaluation of current treatment plan related to hypertension self management and patient's adherence to plan as established by provider. . Reviewed medications with patient and discussed importance of compliance . Discussed plans with patient for ongoing care management follow up and provided patient with direct contact information for care management team . Advised patient, providing education and rationale, to monitor blood pressure daily and record, calling PCP for findings outside established parameters.  . Reviewed scheduled/upcoming provider appointments including  Patient Self Care Activities:  . Self administers medications as prescribed . Attends all scheduled provider appointments . Calls provider office for new concerns, questions, or BP outside discussed parameters . Checks BP and records as discussed . Write blood pressure results in a log or diary  . Bring log to all appointments   Patient Care Plan: Recurrent Cellulitis    Problem Identified: Infection     Goal: Cellulitis Management   Start Date: 07/27/2020  This Visit's Progress: Not on track  Priority: High  Note:   Current Barriers:  . Care Coordination needs related to left lower extremity cellulitis in a patient with CHF, PVD, HTN, and DM . Unable to perform IADLs independently  Nurse Case Manager Clinical Goal(s):  Marland Kitchen Over the next 30 days, patient will work with Consulting civil engineer to address needs related to cellulitis . Over the next 6 weeks, patient will work with [facility] and PT to improve overall physical function and wound healing  Interventions:  . Inter-disciplinary care team collaboration (see longitudinal plan of care) . Chart reviewed including relevant office notes, hospital notes, and lab results . Evaluation of current treatment plan related to cellulitis and  patient's adherence to plan as established by provider. . Provided education to patient re: wound care and s/s of worsening infection . Reviewed medications with patient and discussed Keflex 500 mg BID. Patient has two doses left. . Discussed plans with patient for ongoing care management follow up and provided patient with direct contact information for care management team . Encouraged patient to reach out to PCP with any new or worsening symptoms . Reviewed scheduled/upcoming provider appointments including: 07/29/20 with  Dr Lajuana Ripple . Discussed Home Health o RN and PT will be working with patient to treat/dress wound and increase strength and mobility  Patient Self-Care Activities: . Keep area clean and dry and change dressing as directed . Take antibiotics as directed until complete . Monitor for s/s of worsening or recurrent infection . Reach out to PCP or seek emergency medical attention for any new or worsening symptoms      Plan:  Telephone follow up appointment with care management team member scheduled for: 08/05/20 with RN Care Manager The patient has been provided with contact information for the care management team and has been advised to call with any health related questions or concerns.  The patient will call [phone] as advised to report any new or worsening symptoms of infection.  Next PCP appointment scheduled for: 07/29/20 with Dr Lajuana Ripple   Chong Sicilian, BSN, RN-BC [place] [facility] [place] Management Direct Dial: [phone]

## 2020-07-28 DIAGNOSIS — N1832 Chronic kidney disease, stage 3b: Secondary | ICD-10-CM | POA: Diagnosis not present

## 2020-07-28 DIAGNOSIS — I5033 Acute on chronic diastolic (congestive) heart failure: Secondary | ICD-10-CM | POA: Diagnosis not present

## 2020-07-28 DIAGNOSIS — I130 Hypertensive heart and chronic kidney disease with heart failure and stage 1 through stage 4 chronic kidney disease, or unspecified chronic kidney disease: Secondary | ICD-10-CM | POA: Diagnosis not present

## 2020-07-28 DIAGNOSIS — E1122 Type 2 diabetes mellitus with diabetic chronic kidney disease: Secondary | ICD-10-CM | POA: Diagnosis not present

## 2020-07-28 DIAGNOSIS — I0981 Rheumatic heart failure: Secondary | ICD-10-CM | POA: Diagnosis not present

## 2020-07-28 DIAGNOSIS — D631 Anemia in chronic kidney disease: Secondary | ICD-10-CM | POA: Diagnosis not present

## 2020-07-29 ENCOUNTER — Ambulatory Visit: Payer: Medicare Other | Admitting: Family Medicine

## 2020-07-29 ENCOUNTER — Other Ambulatory Visit: Payer: Self-pay

## 2020-07-29 ENCOUNTER — Encounter: Payer: Self-pay | Admitting: Family Medicine

## 2020-07-29 VITALS — BP 138/70 | HR 80 | Temp 97.6°F

## 2020-07-29 DIAGNOSIS — D631 Anemia in chronic kidney disease: Secondary | ICD-10-CM | POA: Diagnosis not present

## 2020-07-29 DIAGNOSIS — I872 Venous insufficiency (chronic) (peripheral): Secondary | ICD-10-CM

## 2020-07-29 DIAGNOSIS — N184 Chronic kidney disease, stage 4 (severe): Secondary | ICD-10-CM | POA: Diagnosis not present

## 2020-07-29 DIAGNOSIS — L03116 Cellulitis of left lower limb: Secondary | ICD-10-CM

## 2020-07-29 DIAGNOSIS — Z23 Encounter for immunization: Secondary | ICD-10-CM

## 2020-07-29 LAB — HEMOGLOBIN, FINGERSTICK: Hemoglobin: 11.3 g/dL (ref 11.1–15.9)

## 2020-07-29 MED ORDER — FLUCONAZOLE 150 MG PO TABS: 150.0000 mg | ORAL_TABLET | Freq: Once | ORAL | 0 refills | Status: AC

## 2020-07-29 MED ORDER — CEPHALEXIN 500 MG PO CAPS: 500.0000 mg | ORAL_CAPSULE | Freq: Two times a day (BID) | ORAL | 0 refills | Status: AC

## 2020-07-29 MED ORDER — CEFTRIAXONE SODIUM 1 G IJ SOLR
1.0000 g | Freq: Once | INTRAMUSCULAR | Status: AC
  Administered 2020-07-29: 1 g via INTRAMUSCULAR

## 2020-07-29 NOTE — Patient Instructions (Signed)
START CEPHALEXIN TOMORROW.

## 2020-07-29 NOTE — Progress Notes (Signed)
Subjective: CC: TOC for LLE cellulitis PCP: [name], DO QMG:[id number] P Scheirer is a 69 y.o. female, who is accompanied today by her husband.  She is presenting to clinic today for:  1. LLE Cellulitis Patient was hospitalized at [facility] for left lower extremity cellulitis.  Her white blood cell count at that time was 11 at discharge it was 8.3.  Venous ultrasound was negative for DVT and there is no evidence of osteomyelitis on x-ray.  She was treated with IV cefazolin and then ultimately transitioned over to Keflex to complete a 7-day treatment.  Wound care evaluated her in the hospital.  She was discharged home with home health physical therapy and instructed to follow-up with PCP within 1 week of discharge.   She completed her antibiotic yesterday.  She still has some redness but the swelling has improved quite a bit.  She has a follow-up with her vascular surgeon on Friday.   ROS: Per HPI  Allergies  Allergen Reactions   Amlodipine Swelling    Ankle swelling   Atorvastatin Other (See Comments)    Myalgias    Crestor [Rosuvastatin Calcium] Other (See Comments)    Stiffness and back pain   Ezetimibe-Simvastatin Other (See Comments)    Leg cramps   Sitagliptin Other (See Comments)    Unknown reaction   Celebrex [Celecoxib] Rash   Levemir [Insulin Detemir] Rash   Past Medical History:  Diagnosis Date   Atrial fibrillation ([place])    a. maintaining sinus s/p DCCV, on Eliquis   CAD (coronary artery disease)    a. Nonobstructive by cath 2006 - 25% LAD, 25-30% after 1st diag, distal 25% PDA, minor irreg LCx. b. Normal nuc 2011.   Cellulitis and abscess of left leg 07/2020   CHF (congestive heart failure) (HCC)    CKD (chronic kidney disease), stage III ([place])    Cystocele    Difficult intubation 09-07-2012   big neck trouble with intubation parotid surgery"takes little med to sedate"   Esophageal stricture    GERD (gastroesophageal reflux  disease)    Hiatal hernia    a. 07/2013: HH with small stricture holding up barium tablet (concides with patient's sx of food sticking).   History of kidney stones    Hyperlipidemia    Hypertension    IBS (irritable bowel syndrome)    Memory difficulty 11/26/2015   Morbid obesity ([place])    a. 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   Pericardial effusion    a. Mod by echo 2012 at time of PNA. b. Echo 07/2012: small pericardial effusion vs fat.   Pressure ulcer 04/16/2019   right foot   S/P TAVR (transcatheter aortic valve replacement)    Severe aortic stenosis    Sleep apnea     "have mask; don't use it" (02/14/2018)   TIA (transient ischemic attack) 10/2014; 06/2016   "some memory issues since; daughter says my speech is sometimes different" (02/14/2018)   Type II diabetes mellitus ([place])     Current Outpatient Medications:    ACCU-CHEK AVIVA PLUS test strip, USE TO CHECK BLOOD SUGAR UP TO TWICE DAILY OR AS INSTRUCTED (Patient taking differently: 1 each by Other route 2 (two) times daily. ), Disp: 100 each, Rfl: 2   Alirocumab (PRALUENT) 75 MG/ML SOAJ, Inject 75 mg into the skin every  14 (fourteen) days., Disp: 2 mL, Rfl: 6   amiodarone (PACERONE) 200 MG tablet, Take 1 tablet 2 times a day for 7 days, then reduce down to 1 tablet once daily (Patient taking differently: Take 200 mg by mouth See admin instructions. Take 1 tablet (200 mg totally) by mouth 2 times a day for 7 days; then takes 1 tablet (200 mg totally) by mouth daily), Disp: 45 tablet, Rfl: 3   B-D INS SYR ULTRAFINE 1CC/31G 31G X 5/16" 1 ML MISC, USE TO INJECT INSULIN TWICE A DAY AS INSTRUCTED (Patient taking differently: 1 Stick by Other route 3 (three) times daily before meals. ), Disp: 100 each, Rfl: 2   benzonatate (TESSALON PERLES) 100 MG capsule, Take 1  capsule (100 mg total) by mouth 3 (three) times daily as needed. (Patient taking differently: Take 100 mg by mouth 3 (three) times daily as needed for cough. ), Disp: 20 capsule, Rfl: 0   Calcium Carbonate (CALCIUM 600 PO), Take 1 tablet by mouth daily., Disp: , Rfl:    Cholecalciferol (VITAMIN D3 PO), Take 1 tablet by mouth daily. , Disp: , Rfl:    docusate sodium (COLACE) 50 MG capsule, Take 2 capsules (100 mg total) by mouth 2 (two) times daily. (Patient taking differently: Take 100 mg by mouth daily. ), Disp: 10 capsule, Rfl: 0   ELIQUIS 5 MG TABS tablet, TAKE 1 TABLET BY MOUTH TWICE A DAY (Patient taking differently: Take 5 mg by mouth 2 (two) times daily. ), Disp: 180 tablet, Rfl: 1   ferrous sulfate (FERROUSUL) 325 (65 FE) MG tablet, Take 1 tablet (325 mg total) by mouth 2 (two) times daily with a meal., Disp: 180 tablet, Rfl: 3   hydrALAZINE (APRESOLINE) 50 MG tablet, Take 50 mg by mouth 3 (three) times daily., Disp: , Rfl:    insulin NPH Human (NOVOLIN N) 100 UNIT/ML injection, Inject 0.25 mLs (25 Units total) into the skin 2 (two) times daily at 8 am and 10 pm. (Patient taking differently: Inject 50 Units into the skin in the morning and at bedtime. ), Disp: 10 mL, Rfl: 11   Insulin Pen Needle 32G X 4 MM MISC, Use to inject insulin with insulin pen, Disp: 100 each, Rfl: 4   loratadine (CLARITIN) 10 MG tablet, Take 10 mg by mouth daily. , Disp: , Rfl:    meclizine (ANTIVERT) 12.5 MG tablet, Take 1 tablet (12.5 mg total) by mouth 3 (three) times daily as needed for dizziness., Disp: 30 tablet, Rfl: 5   Multiple Vitamins-Minerals (MULTIVITAMIN GUMMIES ADULT PO), Take 2 each by mouth daily. , Disp: , Rfl:    NOVOLIN R RELION 100 UNIT/ML injection, Inject 20-30 Units into the skin 2 (two) times daily before a meal. Per sliding scale, Disp: , Rfl:    nutrition supplement, JUVEN, (JUVEN) PACK, Take 1 packet by mouth 2 (two) times daily between meals., Disp: 60 packet, Rfl: 0    pantoprazole (PROTONIX) 40 MG tablet, Take 1 tablet (40 mg total) by mouth daily., Disp: 30 tablet, Rfl: 11   Pitavastatin Calcium (LIVALO) 4 MG TABS, Take 4 mg by mouth daily. , Disp: , Rfl:    RELION INSULIN SYRINGE 31G X 15/64" 1 ML MISC, Inject 1 Syringe into the skin 3 (three) times daily., Disp: , Rfl:    Tafamidis (VYNDAMAX) 61 MG CAPS, Take 61 mg by mouth daily., Disp: 30 capsule, Rfl: 11   torsemide (DEMADEX) 20 MG tablet, Take 40 mg (2 tablets) every morning and  20 mg (1 tablet) every evening (Patient taking differently: Take 20 mg by mouth 2 (two) times daily. ), Disp: 90 tablet, Rfl: 5   triamcinolone (KENALOG) 0.1 %, Apply 1 application topically daily., Disp: , Rfl:    VASCEPA 1 g capsule, TAKE 2 CAPSULES (2 G TOTAL) BY MOUTH 2 (TWO) TIMES A DAY. (Patient taking differently: Take 2 g by mouth 2 (two) times daily. ), Disp: 120 capsule, Rfl: 6 Social History   Socioeconomic History   Marital status: Married    Spouse name: Not on file   Number of children: 3   Years of education: some college   Highest education level: Some college, no degree  Occupational History   Occupation: retired    Fish farm manager: [employer]  Tobacco Use   Smoking status: Never Smoker   Smokeless tobacco: Never Used  Scientific laboratory technician Use: Never used  Substance and Sexual Activity   Alcohol use: Not Currently    Alcohol/week: 0.0 standard drinks    Comment:  "mixed drink q few years"   Drug use: Never   Sexual activity: Yes    Birth control/protection: Post-menopausal  Other Topics Concern   Not on file  Social History Narrative   Married   Patient is right handed.   Patient rarely drinks caffeine.   Lives at home with husband in two story home. Husband washes laundry in the basement. They have 3 daughters and 7 grandchildren.    Social Determinants of Health   Financial Resource Strain: Low Risk    Difficulty of Paying Living Expenses: Not hard at all  Food Insecurity:  Not on file  Transportation Needs: No Transportation Needs   Lack of Transportation (Medical): No   Lack of Transportation (Non-Medical): No  Physical Activity: Not on file  Stress: Not on file  Social Connections: Not on file  Intimate Partner Violence: Not on file   Family History  Problem Relation Age of Onset   Heart failure Mother    Hypertension Mother    Skin cancer Mother    Colitis Mother    Diabetes Mother    Kidney Stones Mother    Stroke Mother    Heart disease Father    Colon polyps Father    Diabetes Father    Heart failure Father    Heart attack Sister    Stroke Maternal Grandfather    Diabetes Maternal Grandfather    Diabetes Daughter        prediabetes   Colon cancer Neg Hx     Objective: Office vital signs reviewed. BP 138/70    Pulse 80    Temp 97.6 F (36.4 C) (Temporal)    SpO2 97%   Physical Examination:  General: Awake, alert, chronically ill-appearing female, No acute distress MSK: Arrives in wheelchair.  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  There is moderate tenderness and erythema.  Mild warmth.  No appreciable exudates.  There is a black eschar noted centrally at the venous stasis ulcer  Assessment/ Plan: 69 y.o. female   Venous stasis ulcer of left calf with fat layer exposed without varicose veins (HCC) - Plan: cefTRIAXone (ROCEPHIN) injection 1 g, cephALEXin (KEFLEX) 500 MG capsule, fluconazole (DIFLUCAN) 150 MG tablet  Cellulitis of left lower extremity - Plan: cefTRIAXone (ROCEPHIN) injection 1 g, cephALEXin (KEFLEX) 500 MG capsule, fluconazole (DIFLUCAN) 150 MG tablet  Anemia due to stage 4  chronic kidney disease ([place]) - Plan: Hemoglobin, fingerstick  Need for immunization against influenza - Plan: Flu Vaccine QUAD High Dose(Fluad)  Ongoing cellulitis is suspected.  She was given a dose of Rocephin here in the office and I had like  her to get back on Keflex twice daily tomorrow.  Diflucan tablet was given for as needed use.  Caution arrhythmia with use of her current cardiac medication.  Hemoglobin was checked as it was slightly down in the hospital but it has gotten better since discharge.  Additionally, her influenza vaccination was administered  No orders of the defined types were placed in this encounter.  No orders of the defined types were placed in this encounter.   Today's visit is for Transitional Care Management.  The patient was discharged from [facility] on 07/23/2020 with a primary diagnosis of LLE cellulitis.   Contact with the patient and/or caregiver, by a clinical staff member, was made on 07/24/2020 and was documented as a telephone encounter within the EMR.  Through chart review and discussion with the patient I have determined that management of their condition is of moderate complexity.    [name], DO [place] [phone]

## 2020-07-31 ENCOUNTER — Telehealth: Payer: Self-pay | Admitting: *Deleted

## 2020-07-31 DIAGNOSIS — N1832 Chronic kidney disease, stage 3b: Secondary | ICD-10-CM | POA: Diagnosis not present

## 2020-07-31 DIAGNOSIS — I739 Peripheral vascular disease, unspecified: Secondary | ICD-10-CM | POA: Diagnosis not present

## 2020-07-31 DIAGNOSIS — Z48812 Encounter for surgical aftercare following surgery on the circulatory system: Secondary | ICD-10-CM | POA: Diagnosis not present

## 2020-07-31 DIAGNOSIS — I130 Hypertensive heart and chronic kidney disease with heart failure and stage 1 through stage 4 chronic kidney disease, or unspecified chronic kidney disease: Secondary | ICD-10-CM | POA: Diagnosis not present

## 2020-07-31 DIAGNOSIS — I5033 Acute on chronic diastolic (congestive) heart failure: Secondary | ICD-10-CM | POA: Diagnosis not present

## 2020-07-31 DIAGNOSIS — I0981 Rheumatic heart failure: Secondary | ICD-10-CM | POA: Diagnosis not present

## 2020-07-31 DIAGNOSIS — E1122 Type 2 diabetes mellitus with diabetic chronic kidney disease: Secondary | ICD-10-CM | POA: Diagnosis not present

## 2020-07-31 DIAGNOSIS — D631 Anemia in chronic kidney disease: Secondary | ICD-10-CM | POA: Diagnosis not present

## 2020-07-31 NOTE — Telephone Encounter (Signed)
[facility] home [facility] requesting verbal order for PT 1 time weekly for 6 weeks for strength training and balance

## 2020-07-31 NOTE — Telephone Encounter (Signed)
[email or web address]

## 2020-07-31 NOTE — Telephone Encounter (Signed)
Yes okay to provide.

## 2020-08-03 ENCOUNTER — Other Ambulatory Visit: Payer: Self-pay | Admitting: Cardiology

## 2020-08-04 DIAGNOSIS — I0981 Rheumatic heart failure: Secondary | ICD-10-CM | POA: Diagnosis not present

## 2020-08-04 DIAGNOSIS — I5033 Acute on chronic diastolic (congestive) heart failure: Secondary | ICD-10-CM | POA: Diagnosis not present

## 2020-08-04 DIAGNOSIS — D631 Anemia in chronic kidney disease: Secondary | ICD-10-CM | POA: Diagnosis not present

## 2020-08-04 DIAGNOSIS — N1832 Chronic kidney disease, stage 3b: Secondary | ICD-10-CM | POA: Diagnosis not present

## 2020-08-04 DIAGNOSIS — E1122 Type 2 diabetes mellitus with diabetic chronic kidney disease: Secondary | ICD-10-CM | POA: Diagnosis not present

## 2020-08-04 DIAGNOSIS — I130 Hypertensive heart and chronic kidney disease with heart failure and stage 1 through stage 4 chronic kidney disease, or unspecified chronic kidney disease: Secondary | ICD-10-CM | POA: Diagnosis not present

## 2020-08-04 NOTE — Telephone Encounter (Signed)
No more information needed  

## 2020-08-05 ENCOUNTER — Telehealth: Payer: Medicare Other | Admitting: *Deleted

## 2020-08-05 ENCOUNTER — Other Ambulatory Visit (HOSPITAL_COMMUNITY): Payer: Self-pay | Admitting: Cardiology

## 2020-08-05 DIAGNOSIS — E11622 Type 2 diabetes mellitus with other skin ulcer: Secondary | ICD-10-CM | POA: Diagnosis not present

## 2020-08-05 DIAGNOSIS — Z89422 Acquired absence of other left toe(s): Secondary | ICD-10-CM | POA: Diagnosis not present

## 2020-08-05 DIAGNOSIS — I739 Peripheral vascular disease, unspecified: Secondary | ICD-10-CM | POA: Diagnosis not present

## 2020-08-05 DIAGNOSIS — L97229 Non-pressure chronic ulcer of left calf with unspecified severity: Secondary | ICD-10-CM | POA: Diagnosis not present

## 2020-08-05 DIAGNOSIS — Z48812 Encounter for surgical aftercare following surgery on the circulatory system: Secondary | ICD-10-CM | POA: Diagnosis not present

## 2020-08-06 DIAGNOSIS — I0981 Rheumatic heart failure: Secondary | ICD-10-CM | POA: Diagnosis not present

## 2020-08-06 DIAGNOSIS — N1832 Chronic kidney disease, stage 3b: Secondary | ICD-10-CM | POA: Diagnosis not present

## 2020-08-06 DIAGNOSIS — D631 Anemia in chronic kidney disease: Secondary | ICD-10-CM | POA: Diagnosis not present

## 2020-08-06 DIAGNOSIS — I5033 Acute on chronic diastolic (congestive) heart failure: Secondary | ICD-10-CM | POA: Diagnosis not present

## 2020-08-06 DIAGNOSIS — I130 Hypertensive heart and chronic kidney disease with heart failure and stage 1 through stage 4 chronic kidney disease, or unspecified chronic kidney disease: Secondary | ICD-10-CM | POA: Diagnosis not present

## 2020-08-06 DIAGNOSIS — E1122 Type 2 diabetes mellitus with diabetic chronic kidney disease: Secondary | ICD-10-CM | POA: Diagnosis not present

## 2020-08-06 NOTE — Telephone Encounter (Signed)
Left message with [facility] nurse.  This encounter will now be closed

## 2020-08-07 DIAGNOSIS — E1122 Type 2 diabetes mellitus with diabetic chronic kidney disease: Secondary | ICD-10-CM | POA: Diagnosis not present

## 2020-08-07 DIAGNOSIS — I0981 Rheumatic heart failure: Secondary | ICD-10-CM | POA: Diagnosis not present

## 2020-08-07 DIAGNOSIS — I5033 Acute on chronic diastolic (congestive) heart failure: Secondary | ICD-10-CM | POA: Diagnosis not present

## 2020-08-07 DIAGNOSIS — D631 Anemia in chronic kidney disease: Secondary | ICD-10-CM | POA: Diagnosis not present

## 2020-08-07 DIAGNOSIS — N1832 Chronic kidney disease, stage 3b: Secondary | ICD-10-CM | POA: Diagnosis not present

## 2020-08-07 DIAGNOSIS — I130 Hypertensive heart and chronic kidney disease with heart failure and stage 1 through stage 4 chronic kidney disease, or unspecified chronic kidney disease: Secondary | ICD-10-CM | POA: Diagnosis not present

## 2020-08-10 MED FILL — VYNDAMAX 61 MG CAPS: 61 | 30 days supply | Qty: 30 | Fill #0

## 2020-08-11 ENCOUNTER — Other Ambulatory Visit: Payer: Self-pay | Admitting: Cardiology

## 2020-08-11 DIAGNOSIS — E1122 Type 2 diabetes mellitus with diabetic chronic kidney disease: Secondary | ICD-10-CM | POA: Diagnosis not present

## 2020-08-11 DIAGNOSIS — I130 Hypertensive heart and chronic kidney disease with heart failure and stage 1 through stage 4 chronic kidney disease, or unspecified chronic kidney disease: Secondary | ICD-10-CM | POA: Diagnosis not present

## 2020-08-11 DIAGNOSIS — I0981 Rheumatic heart failure: Secondary | ICD-10-CM | POA: Diagnosis not present

## 2020-08-11 DIAGNOSIS — D631 Anemia in chronic kidney disease: Secondary | ICD-10-CM | POA: Diagnosis not present

## 2020-08-11 DIAGNOSIS — I5033 Acute on chronic diastolic (congestive) heart failure: Secondary | ICD-10-CM | POA: Diagnosis not present

## 2020-08-11 DIAGNOSIS — N1832 Chronic kidney disease, stage 3b: Secondary | ICD-10-CM | POA: Diagnosis not present

## 2020-08-14 DIAGNOSIS — N184 Chronic kidney disease, stage 4 (severe): Secondary | ICD-10-CM | POA: Diagnosis not present

## 2020-08-14 DIAGNOSIS — Z4800 Encounter for change or removal of nonsurgical wound dressing: Secondary | ICD-10-CM | POA: Diagnosis not present

## 2020-08-14 DIAGNOSIS — K219 Gastro-esophageal reflux disease without esophagitis: Secondary | ICD-10-CM | POA: Diagnosis not present

## 2020-08-14 DIAGNOSIS — E1122 Type 2 diabetes mellitus with diabetic chronic kidney disease: Secondary | ICD-10-CM | POA: Diagnosis not present

## 2020-08-14 DIAGNOSIS — I480 Paroxysmal atrial fibrillation: Secondary | ICD-10-CM | POA: Diagnosis not present

## 2020-08-14 DIAGNOSIS — I2510 Atherosclerotic heart disease of native coronary artery without angina pectoris: Secondary | ICD-10-CM | POA: Diagnosis not present

## 2020-08-14 DIAGNOSIS — I130 Hypertensive heart and chronic kidney disease with heart failure and stage 1 through stage 4 chronic kidney disease, or unspecified chronic kidney disease: Secondary | ICD-10-CM | POA: Diagnosis not present

## 2020-08-14 DIAGNOSIS — E1151 Type 2 diabetes mellitus with diabetic peripheral angiopathy without gangrene: Secondary | ICD-10-CM | POA: Diagnosis not present

## 2020-08-14 DIAGNOSIS — I081 Rheumatic disorders of both mitral and tricuspid valves: Secondary | ICD-10-CM | POA: Diagnosis not present

## 2020-08-14 DIAGNOSIS — I959 Hypotension, unspecified: Secondary | ICD-10-CM | POA: Diagnosis not present

## 2020-08-14 DIAGNOSIS — I0981 Rheumatic heart failure: Secondary | ICD-10-CM | POA: Diagnosis not present

## 2020-08-14 DIAGNOSIS — I700 Atherosclerosis of aorta: Secondary | ICD-10-CM | POA: Diagnosis not present

## 2020-08-14 DIAGNOSIS — I70201 Unspecified atherosclerosis of native arteries of extremities, right leg: Secondary | ICD-10-CM | POA: Diagnosis not present

## 2020-08-14 DIAGNOSIS — I872 Venous insufficiency (chronic) (peripheral): Secondary | ICD-10-CM | POA: Diagnosis not present

## 2020-08-14 DIAGNOSIS — D631 Anemia in chronic kidney disease: Secondary | ICD-10-CM | POA: Diagnosis not present

## 2020-08-14 DIAGNOSIS — E785 Hyperlipidemia, unspecified: Secondary | ICD-10-CM | POA: Diagnosis not present

## 2020-08-14 DIAGNOSIS — E1165 Type 2 diabetes mellitus with hyperglycemia: Secondary | ICD-10-CM | POA: Diagnosis not present

## 2020-08-14 DIAGNOSIS — I5033 Acute on chronic diastolic (congestive) heart failure: Secondary | ICD-10-CM | POA: Diagnosis not present

## 2020-08-14 DIAGNOSIS — L97228 Non-pressure chronic ulcer of left calf with other specified severity: Secondary | ICD-10-CM | POA: Diagnosis not present

## 2020-08-14 DIAGNOSIS — M479 Spondylosis, unspecified: Secondary | ICD-10-CM | POA: Diagnosis not present

## 2020-08-14 DIAGNOSIS — G4733 Obstructive sleep apnea (adult) (pediatric): Secondary | ICD-10-CM | POA: Diagnosis not present

## 2020-08-14 DIAGNOSIS — D509 Iron deficiency anemia, unspecified: Secondary | ICD-10-CM | POA: Diagnosis not present

## 2020-08-14 DIAGNOSIS — L03116 Cellulitis of left lower limb: Secondary | ICD-10-CM | POA: Diagnosis not present

## 2020-08-14 DIAGNOSIS — L97828 Non-pressure chronic ulcer of other part of left lower leg with other specified severity: Secondary | ICD-10-CM | POA: Diagnosis not present

## 2020-08-14 DIAGNOSIS — I272 Pulmonary hypertension, unspecified: Secondary | ICD-10-CM | POA: Diagnosis not present

## 2020-08-17 ENCOUNTER — Encounter (HOSPITAL_COMMUNITY): Payer: Medicare Other

## 2020-08-18 DIAGNOSIS — Z4800 Encounter for change or removal of nonsurgical wound dressing: Secondary | ICD-10-CM | POA: Diagnosis not present

## 2020-08-18 DIAGNOSIS — E1151 Type 2 diabetes mellitus with diabetic peripheral angiopathy without gangrene: Secondary | ICD-10-CM | POA: Diagnosis not present

## 2020-08-18 DIAGNOSIS — I70201 Unspecified atherosclerosis of native arteries of extremities, right leg: Secondary | ICD-10-CM | POA: Diagnosis not present

## 2020-08-18 DIAGNOSIS — I872 Venous insufficiency (chronic) (peripheral): Secondary | ICD-10-CM | POA: Diagnosis not present

## 2020-08-18 DIAGNOSIS — L97228 Non-pressure chronic ulcer of left calf with other specified severity: Secondary | ICD-10-CM | POA: Diagnosis not present

## 2020-08-18 DIAGNOSIS — L97828 Non-pressure chronic ulcer of other part of left lower leg with other specified severity: Secondary | ICD-10-CM | POA: Diagnosis not present

## 2020-08-19 ENCOUNTER — Telehealth (HOSPITAL_COMMUNITY): Payer: Self-pay | Admitting: *Deleted

## 2020-08-19 DIAGNOSIS — I739 Peripheral vascular disease, unspecified: Secondary | ICD-10-CM | POA: Diagnosis not present

## 2020-08-19 DIAGNOSIS — L97221 Non-pressure chronic ulcer of left calf limited to breakdown of skin: Secondary | ICD-10-CM | POA: Diagnosis not present

## 2020-08-19 NOTE — Telephone Encounter (Signed)
Ali physical therapist with [name] called to report she saw pt for physical therapy yesterday and she said she had a fall Monday. She slid out of her chair but her husband caught her. No injury noted or reported. Physical therapist reporting as an FYI. I called pt to check on her no answer/left vm requesting return call.   Call back# [phone]

## 2020-08-21 ENCOUNTER — Ambulatory Visit: Payer: Medicare Other | Admitting: Adult Health

## 2020-08-21 DIAGNOSIS — I70201 Unspecified atherosclerosis of native arteries of extremities, right leg: Secondary | ICD-10-CM | POA: Diagnosis not present

## 2020-08-21 DIAGNOSIS — I872 Venous insufficiency (chronic) (peripheral): Secondary | ICD-10-CM | POA: Diagnosis not present

## 2020-08-21 DIAGNOSIS — E1151 Type 2 diabetes mellitus with diabetic peripheral angiopathy without gangrene: Secondary | ICD-10-CM | POA: Diagnosis not present

## 2020-08-21 DIAGNOSIS — Z4800 Encounter for change or removal of nonsurgical wound dressing: Secondary | ICD-10-CM | POA: Diagnosis not present

## 2020-08-21 DIAGNOSIS — L97828 Non-pressure chronic ulcer of other part of left lower leg with other specified severity: Secondary | ICD-10-CM | POA: Diagnosis not present

## 2020-08-21 DIAGNOSIS — L97228 Non-pressure chronic ulcer of left calf with other specified severity: Secondary | ICD-10-CM | POA: Diagnosis not present

## 2020-08-25 DIAGNOSIS — Z23 Encounter for immunization: Secondary | ICD-10-CM | POA: Diagnosis not present

## 2020-08-26 DIAGNOSIS — L97228 Non-pressure chronic ulcer of left calf with other specified severity: Secondary | ICD-10-CM | POA: Diagnosis not present

## 2020-08-26 DIAGNOSIS — Z4800 Encounter for change or removal of nonsurgical wound dressing: Secondary | ICD-10-CM | POA: Diagnosis not present

## 2020-08-26 DIAGNOSIS — I70201 Unspecified atherosclerosis of native arteries of extremities, right leg: Secondary | ICD-10-CM | POA: Diagnosis not present

## 2020-08-26 DIAGNOSIS — L97828 Non-pressure chronic ulcer of other part of left lower leg with other specified severity: Secondary | ICD-10-CM | POA: Diagnosis not present

## 2020-08-26 DIAGNOSIS — I872 Venous insufficiency (chronic) (peripheral): Secondary | ICD-10-CM | POA: Diagnosis not present

## 2020-08-26 DIAGNOSIS — E1151 Type 2 diabetes mellitus with diabetic peripheral angiopathy without gangrene: Secondary | ICD-10-CM | POA: Diagnosis not present

## 2020-08-27 DIAGNOSIS — Z4800 Encounter for change or removal of nonsurgical wound dressing: Secondary | ICD-10-CM | POA: Diagnosis not present

## 2020-08-27 DIAGNOSIS — L97228 Non-pressure chronic ulcer of left calf with other specified severity: Secondary | ICD-10-CM | POA: Diagnosis not present

## 2020-08-27 DIAGNOSIS — I872 Venous insufficiency (chronic) (peripheral): Secondary | ICD-10-CM | POA: Diagnosis not present

## 2020-08-27 DIAGNOSIS — L97828 Non-pressure chronic ulcer of other part of left lower leg with other specified severity: Secondary | ICD-10-CM | POA: Diagnosis not present

## 2020-08-27 DIAGNOSIS — I70201 Unspecified atherosclerosis of native arteries of extremities, right leg: Secondary | ICD-10-CM | POA: Diagnosis not present

## 2020-08-27 DIAGNOSIS — E1151 Type 2 diabetes mellitus with diabetic peripheral angiopathy without gangrene: Secondary | ICD-10-CM | POA: Diagnosis not present

## 2020-08-28 DIAGNOSIS — I872 Venous insufficiency (chronic) (peripheral): Secondary | ICD-10-CM | POA: Diagnosis not present

## 2020-08-28 DIAGNOSIS — L97228 Non-pressure chronic ulcer of left calf with other specified severity: Secondary | ICD-10-CM | POA: Diagnosis not present

## 2020-08-28 DIAGNOSIS — E1151 Type 2 diabetes mellitus with diabetic peripheral angiopathy without gangrene: Secondary | ICD-10-CM | POA: Diagnosis not present

## 2020-08-28 DIAGNOSIS — Z4800 Encounter for change or removal of nonsurgical wound dressing: Secondary | ICD-10-CM | POA: Diagnosis not present

## 2020-08-28 DIAGNOSIS — L97828 Non-pressure chronic ulcer of other part of left lower leg with other specified severity: Secondary | ICD-10-CM | POA: Diagnosis not present

## 2020-08-28 DIAGNOSIS — I70201 Unspecified atherosclerosis of native arteries of extremities, right leg: Secondary | ICD-10-CM | POA: Diagnosis not present

## 2020-09-01 DIAGNOSIS — Z4800 Encounter for change or removal of nonsurgical wound dressing: Secondary | ICD-10-CM | POA: Diagnosis not present

## 2020-09-01 DIAGNOSIS — I872 Venous insufficiency (chronic) (peripheral): Secondary | ICD-10-CM | POA: Diagnosis not present

## 2020-09-01 DIAGNOSIS — I70201 Unspecified atherosclerosis of native arteries of extremities, right leg: Secondary | ICD-10-CM | POA: Diagnosis not present

## 2020-09-01 DIAGNOSIS — L97828 Non-pressure chronic ulcer of other part of left lower leg with other specified severity: Secondary | ICD-10-CM | POA: Diagnosis not present

## 2020-09-01 DIAGNOSIS — L97228 Non-pressure chronic ulcer of left calf with other specified severity: Secondary | ICD-10-CM | POA: Diagnosis not present

## 2020-09-01 DIAGNOSIS — E1151 Type 2 diabetes mellitus with diabetic peripheral angiopathy without gangrene: Secondary | ICD-10-CM | POA: Diagnosis not present

## 2020-09-02 ENCOUNTER — Telehealth: Payer: Self-pay

## 2020-09-02 MED ORDER — APIXABAN 5 MG PO TABS: 5.0000 mg | ORAL_TABLET | Freq: Two times a day (BID) | ORAL | 1 refills | Status: AC

## 2020-09-02 NOTE — Telephone Encounter (Signed)
Prescription refill request for Eliquis received. Indication: atrial fibrillation Last office visit:7/21  Kayleen Memos Scr:2.9 12/21 Age: 70 Weight: 85 kg  Prescription refilled

## 2020-09-03 ENCOUNTER — Ambulatory Visit (HOSPITAL_COMMUNITY)
Admission: RE | Admit: 2020-09-03 | Discharge: 2020-09-03 | Disposition: A | Discharge status: Home / Self Care | Payer: Medicare Other | Source: Ambulatory Visit | Attending: Family Medicine | Admitting: Family Medicine

## 2020-09-03 ENCOUNTER — Encounter (HOSPITAL_COMMUNITY): Payer: Self-pay

## 2020-09-03 ENCOUNTER — Other Ambulatory Visit: Payer: Self-pay

## 2020-09-03 VITALS — BP 130/52 | HR 72 | Wt 186.8 lb

## 2020-09-03 DIAGNOSIS — Z955 Presence of coronary angioplasty implant and graft: Secondary | ICD-10-CM | POA: Diagnosis not present

## 2020-09-03 DIAGNOSIS — I2510 Atherosclerotic heart disease of native coronary artery without angina pectoris: Secondary | ICD-10-CM | POA: Insufficient documentation

## 2020-09-03 DIAGNOSIS — G4733 Obstructive sleep apnea (adult) (pediatric): Secondary | ICD-10-CM | POA: Insufficient documentation

## 2020-09-03 DIAGNOSIS — I130 Hypertensive heart and chronic kidney disease with heart failure and stage 1 through stage 4 chronic kidney disease, or unspecified chronic kidney disease: Secondary | ICD-10-CM | POA: Diagnosis not present

## 2020-09-03 DIAGNOSIS — Z7901 Long term (current) use of anticoagulants: Secondary | ICD-10-CM | POA: Diagnosis not present

## 2020-09-03 DIAGNOSIS — I480 Paroxysmal atrial fibrillation: Secondary | ICD-10-CM | POA: Diagnosis not present

## 2020-09-03 DIAGNOSIS — N183 Chronic kidney disease, stage 3 unspecified: Secondary | ICD-10-CM | POA: Insufficient documentation

## 2020-09-03 DIAGNOSIS — I5032 Chronic diastolic (congestive) heart failure: Secondary | ICD-10-CM | POA: Diagnosis not present

## 2020-09-03 DIAGNOSIS — Z9884 Bariatric surgery status: Secondary | ICD-10-CM | POA: Diagnosis not present

## 2020-09-03 DIAGNOSIS — Z794 Long term (current) use of insulin: Secondary | ICD-10-CM | POA: Diagnosis not present

## 2020-09-03 DIAGNOSIS — E1122 Type 2 diabetes mellitus with diabetic chronic kidney disease: Secondary | ICD-10-CM | POA: Insufficient documentation

## 2020-09-03 DIAGNOSIS — I10 Essential (primary) hypertension: Secondary | ICD-10-CM

## 2020-09-03 DIAGNOSIS — Z8673 Personal history of transient ischemic attack (TIA), and cerebral infarction without residual deficits: Secondary | ICD-10-CM | POA: Diagnosis not present

## 2020-09-03 DIAGNOSIS — Z953 Presence of xenogenic heart valve: Secondary | ICD-10-CM | POA: Insufficient documentation

## 2020-09-03 DIAGNOSIS — E785 Hyperlipidemia, unspecified: Secondary | ICD-10-CM | POA: Insufficient documentation

## 2020-09-03 DIAGNOSIS — I252 Old myocardial infarction: Secondary | ICD-10-CM | POA: Diagnosis not present

## 2020-09-03 DIAGNOSIS — I350 Nonrheumatic aortic (valve) stenosis: Secondary | ICD-10-CM | POA: Insufficient documentation

## 2020-09-03 LAB — COMPREHENSIVE METABOLIC PANEL
ALT: 35 U/L (ref 0–44)
AST: 41 U/L (ref 15–41)
Albumin: 2.5 g/dL — ABNORMAL LOW (ref 3.5–5.0)
Alkaline Phosphatase: 95 U/L (ref 38–126)
Anion gap: 12 (ref 5–15)
BUN: 52 mg/dL — ABNORMAL HIGH (ref 8–23)
CO2: 27 mmol/L (ref 22–32)
Calcium: 8.7 mg/dL — ABNORMAL LOW (ref 8.9–10.3)
Chloride: 101 mmol/L (ref 98–111)
Creatinine, Ser: 2.53 mg/dL — ABNORMAL HIGH (ref 0.44–1.00)
GFR, Estimated: 20 mL/min — ABNORMAL LOW (ref >60)
Glucose, Bld: 196 mg/dL — ABNORMAL HIGH (ref 70–99)
Potassium: 3.7 mmol/L (ref 3.5–5.1)
Sodium: 140 mmol/L (ref 135–145)
Total Bilirubin: 0.9 mg/dL (ref 0.3–1.2)
Total Protein: 6.4 g/dL — ABNORMAL LOW (ref 6.5–8.1)

## 2020-09-03 LAB — CBC
HCT: 29.7 % — ABNORMAL LOW (ref 36.0–46.0)
Hemoglobin: 8.5 g/dL — ABNORMAL LOW (ref 12.0–15.0)
MCH: 25.4 pg — ABNORMAL LOW (ref 26.0–34.0)
MCHC: 28.6 g/dL — ABNORMAL LOW (ref 30.0–36.0)
MCV: 88.7 fL (ref 80.0–100.0)
Platelets: 399 10*3/uL (ref 150–400)
RBC: 3.35 MIL/uL — ABNORMAL LOW (ref 3.87–5.11)
RDW: 18.6 % — ABNORMAL HIGH (ref 11.5–15.5)
WBC: 9.2 10*3/uL (ref 4.0–10.5)
nRBC: 0 % (ref 0.0–0.2)

## 2020-09-03 LAB — TSH: TSH: 1.945 u[IU]/mL (ref 0.350–4.500)

## 2020-09-03 MED ORDER — TORSEMIDE 20 MG PO TABS: 40.0000 mg | ORAL_TABLET | Freq: Two times a day (BID) | ORAL | 5 refills | Status: AC

## 2020-09-03 NOTE — Patient Instructions (Signed)
INCREASE Torsemide to 40 mg twice a day  Labs today We will only contact you if something comes back abnormal or we need to make some changes. Otherwise no news is good news!  Labs needed in 7-10 days  Your physician recommends that you schedule a follow-up appointment in: 3-4 months with Dr Aundra Dubin  If you have any questions or concerns before your next appointment please send [place] a message through [place] or call our office at [phone].    TO LEAVE A MESSAGE FOR THE NURSE SELECT OPTION 2, PLEASE LEAVE A MESSAGE INCLUDING: . YOUR NAME . DATE OF BIRTH . CALL BACK NUMBER . REASON FOR CALL**this is important as we prioritize the call backs  YOU WILL RECEIVE A CALL BACK THE SAME DAY AS LONG AS YOU CALL BEFORE 4:00 PM

## 2020-09-03 NOTE — Progress Notes (Signed)
ReDS Vest / Clip - 09/03/20 1400      ReDS Vest / Clip   Station Marker C    Ruler Value 31    ReDS Value Range Moderate volume overload    ReDS Actual Value 39

## 2020-09-03 NOTE — Progress Notes (Signed)
Advanced Heart Failure Clinic Note   Referring Physician: PCP: [name], DO PCP-Cardiologist: Minus Breeding, MD   HPI:  Alison Evener Lawsonis a 70 y.o.femalewho has a history of AS s/p TAVR, chronic diastolic CHF, PAF, CKD III, HLD, HTN, OSA, and morbid obesity.   She was admitted from [facility] office2/5/20with marked volume overload. She diuresed 36 lbs with lasix drip and was transitioned to torsemide 40 mg daily. Course complicated by AKI. RHC completedand showed elevated filling pressures and preserved cardiac output.PYP scan suggestive of TTR amyloid. Discharge weight was 208 pounds.   She developed a non-healing right heal ulcer followed by a podiatrist in [place]. She ended up having a peripheral angiogram in [place] with PTCA to the right popliteal and right AT.   In 9/20, she was admitted with fever and found to have Enterococcal bacteremia. TEE was concerning for possible MV vegetation, bioprosthetic aortic valve looked ok. She was treated with IV antibiotics, and repeat TEE in 10/20 did not show residual active endocarditis.   She is now s/p left distal SFA-tibioperoneal trunk bypass with left TMA done at [facility] in 2/21.   She was admitted in 7/21 junctional bradycardia/hypotension. Coreg and amiodarone stopped. She went into atrial fibrillation and ended up getting TEE-guided DCCV. Amiodarone was restarted. She was also treated for PNA and UTI.   She had RSV 05/2020.  Evaluated in the ED on 06/17/2020 for LLE redness. Doppler was negative for DVT. Treated for cellulitiswith keflex. She has had couple rounds of antibiotics with no improvement in LLE.  Seen in [facility] 11/18 for f/u. Overall feelingterrible. Complained of back and LLE wound.Had copious serous exudate from LLE.SOB with minimal exertion. + orthopnea. Directly admitted from clinic for IV Lasix + IV abx for cellulitis.   She diuresed w/ IV lasix but had bump in SCr,  requiring nephrology consultation. They assisted w/ diuretic managment. SCr stabilized ~3.2 (new baseline). She was transitioned back to PO torsemide, 40 qam/20 qpm              LEE cellulitis resolved w/ IV abx. Blood cultures negative. Peripheral arterial dopplers were  performed given LE ulcerations to see if there is worsening PAD. This was a limited study but  showed left popliteal artery appears occluded. Distal PT and AT appear occluded. Unable to  visualize bypass graft. VVS evaluated. Given that patient has extensive history with vascular  surgery of [name] and that her left leg is not acutely ischemic recommended short order f ollow up with [facility] vascular surgery when medically stable for discharge home. This was  arranged.   She also had brief afib w/ RVR and was placed on amio gtt w/ quick reversion back to NSR. She was transitioned back to PO amiodarone and was instructed to take 200 mg bid x 7 days post d/c and then return to 200 mg once daily. Discharge wt was 189 lb.   She was readmitted again 12/21 for LLE cellulitis. WBC 11.DVT study negative. X-ray showed no signs of bony infection. Treated w/ IV cefazolin--> PO keflex to complete 7 days.  She presents to clinic today for routine f/u for chronic diastolic HF. Here w/ her husband. Wt down 3 lb from recent dc wt from 189>>186 lb today but she is mildly fluid overloaded on exam w/ mild LLE edema. ReDs clip 39%. BP 130/52. Denies resting dyspnea but notes mild dyspnea w/ exertion. LEEs w/o erythema. Normal warmth. Denies CP. No palpitations. RRR on exam.   Also of  note, she has seen Vascular surgery at [facility] for her PAD and chronic LT LE wound. They are following closely and have mention that she may ultimately need BKA.      Review of Systems: [y] = yes, [ ]  = no   General: Weight gain [ ] ; Weight loss [ ] ; Anorexia [ ] ; Fatigue [ ] ; Fever [ ] ; Chills [ ] ; Weakness [ ]   Cardiac: Chest pain/pressure [ ] ; Resting SOB [ ] ;  Exertional SOB [Y ]; Orthopnea [ ] ; Pedal Edema [ ] ; Palpitations [ ] ; Syncope [ ] ; Presyncope [ ] ; Paroxysmal nocturnal dyspnea[ ]   Pulmonary: Cough [ ] ; Wheezing[ ] ; Hemoptysis[ ] ; Sputum [ ] ; Snoring [ ]   GI: Vomiting[ ] ; Dysphagia[ ] ; Melena[ ] ; Hematochezia [ ] ; Heartburn[ ] ; Abdominal pain [ ] ; Constipation [ ] ; Diarrhea [ ] ; BRBPR [ ]   GU: Hematuria[ ] ; Dysuria [ ] ; Nocturia[ ]   Vascular: Pain in legs with walking [ ] ; Pain in feet with lying flat [ ] ; Non-healing sores [ ] ; Stroke [ ] ; TIA [ ] ; Slurred speech [ ] ;  Neuro: Headaches[ ] ; Vertigo[ ] ; Seizures[ ] ; Paresthesias[ ] ;Blurred vision [ ] ; Diplopia [ ] ; Vision changes [ ]   Ortho/Skin: Arthritis [ ] ; Joint pain [ ] ; Muscle pain [ ] ; Joint swelling [ ] ; Back Pain [ ] ; Rash [ ]   Psych: Depression[ ] ; Anxiety[ ]   Heme: Bleeding problems [ ] ; Clotting disorders [ ] ; Anemia [ ]   Endocrine: Diabetes [ ] ; Thyroid dysfunction[ ]    Past Medical History:  Diagnosis Date  . Atrial fibrillation ([place])    a. maintaining sinus s/p DCCV, on Eliquis  . CAD (coronary artery disease)    a. Nonobstructive by cath 2006 - 25% LAD, 25-30% after 1st diag, distal 25% PDA, minor irreg LCx. b. Normal nuc 2011.  . Cellulitis and abscess of left leg 07/2020  . CHF (congestive heart failure) ([place])   . CKD (chronic kidney disease), stage III ([place])   . Cystocele   . Difficult intubation 09-07-2012   big neck trouble with intubation parotid surgery"takes little med to sedate"  . Esophageal stricture   . GERD (gastroesophageal reflux disease)   . Hiatal hernia    a. 07/2013: HH with small stricture holding up barium tablet (concides with patient's sx of food sticking).  . History of kidney stones   . Hyperlipidemia   . Hypertension   . IBS (irritable bowel syndrome)   . Memory difficulty 11/26/2015  . Morbid obesity ([place])    a. hx of gastric bypass (sleeve gastrectomy 2015)  . Non-STEMI (non-ST elevated myocardial infarction) ([place]) 01/16/2020  .  Osteoporosis   . 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  Marland Kitchen Pericardial effusion    a. Mod by echo 2012 at time of PNA. b. Echo 07/2012: small pericardial effusion vs fat.  . Pressure ulcer 04/16/2019   right foot  . S/P TAVR (transcatheter aortic valve replacement)   . Severe aortic stenosis   . Sleep apnea     "have mask; don't use it" (02/14/2018)  . TIA (transient ischemic attack) 10/2014; 06/2016   "some memory issues since; daughter says my speech is sometimes different" (02/14/2018)  . Type II diabetes mellitus ([place])     Current Outpatient Medications  Medication Sig Dispense Refill  . ACCU-CHEK AVIVA PLUS test strip USE TO CHECK BLOOD SUGAR UP TO TWICE DAILY OR AS INSTRUCTED (Patient taking differently: 1 each by Other route 2 (two) times daily.)  100 each 2  . Alirocumab (PRALUENT) 75 MG/ML SOAJ Inject 75 mg into the skin every 14 (fourteen) days. 2 mL 6  . amiodarone (PACERONE) 200 MG tablet Take 200 mg by mouth daily.    Marland Kitchen apixaban (ELIQUIS) 5 MG TABS tablet Take 1 tablet (5 mg total) by mouth 2 (two) times daily. 180 tablet 1  . B-D INS SYR ULTRAFINE 1CC/31G 31G X 5/16" 1 ML MISC USE TO INJECT INSULIN TWICE A DAY AS INSTRUCTED (Patient taking differently: 1 Stick by Other route 3 (three) times daily before meals.) 100 each 2  . Calcium Carbonate (CALCIUM 600 PO) Take 1 tablet by mouth daily.    . Cholecalciferol (VITAMIN D3 PO) Take 1 tablet by mouth daily.     . hydrALAZINE (APRESOLINE) 50 MG tablet Take 50 mg by mouth in the morning and at bedtime.    . insulin NPH Human (NOVOLIN N) 100 UNIT/ML injection Inject 0.25 mLs (25 Units total) into the skin 2 (two) times daily at 8 am and 10 pm. (Patient taking differently: Inject 50 Units into the skin 2 (two) times daily at 8 am and 10 pm.) 10 mL 11  . Insulin Pen Needle 32G X 4 MM MISC Use to inject insulin with insulin pen 100 each 4  . loratadine (CLARITIN) 10 MG tablet Take 10 mg by mouth daily.     . meclizine  (ANTIVERT) 12.5 MG tablet Take 1 tablet (12.5 mg total) by mouth 3 (three) times daily as needed for dizziness. 30 tablet 5  . Multiple Vitamins-Minerals (MULTIVITAMIN GUMMIES ADULT PO) Take 2 each by mouth daily.     Marland Kitchen NOVOLIN R RELION 100 UNIT/ML injection Inject 20-30 Units into the skin 2 (two) times daily before a meal. Per sliding scale    . nutrition supplement, JUVEN, (JUVEN) PACK Take 1 packet by mouth 2 (two) times daily between meals. 60 packet 0  . pantoprazole (PROTONIX) 40 MG tablet Take 1 tablet (40 mg total) by mouth daily. 30 tablet 11  . Pitavastatin Calcium (LIVALO) 4 MG TABS Take 4 mg by mouth daily.     Marland Kitchen RELION INSULIN SYRINGE 31G X 15/64" 1 ML MISC Inject 1 Syringe into the skin 3 (three) times daily.    Marland Kitchen torsemide (DEMADEX) 20 MG tablet Take 40 mg (2 tablets) every morning and 20 mg (1 tablet) every evening 90 tablet 5  . triamcinolone (KENALOG) 0.1 % Apply 1 application topically daily.    Marland Kitchen VASCEPA 1 g capsule TAKE 2 CAPSULES (2 G TOTAL) BY MOUTH 2 (TWO) TIMES A DAY. 120 capsule 6  . VYNDAMAX 61 MG CAPS TAKE 1 CAPSULE (61 MG) BY MOUTH DAILY. 30 capsule 11   No current facility-administered medications for this encounter.    Allergies  Allergen Reactions  . Amlodipine Swelling    Ankle swelling  . Atorvastatin Other (See Comments)    Myalgias   . Crestor [Rosuvastatin Calcium] Other (See Comments)    Stiffness and back pain  . Ezetimibe-Simvastatin Other (See Comments)    Leg cramps  . Sitagliptin Other (See Comments)    Unknown reaction  . Celebrex [Celecoxib] Rash  . Levemir [Insulin Detemir] Rash      Social History   Socioeconomic History  . Marital status: Married    Spouse name: Not on file  . Number of children: 3  . Years of education: some college  . Highest education level: Some college, no degree  Occupational History  . Occupation: retired  Employer: [employer]  Tobacco Use  . Smoking status: Never Smoker  . Smokeless tobacco:  Never Used  Vaping Use  . Vaping Use: Never used  Substance and Sexual Activity  . Alcohol use: Not Currently    Alcohol/week: 0.0 standard drinks    Comment:  "mixed drink q few years"  . Drug use: Never  . Sexual activity: Yes    Birth control/protection: Post-menopausal  Other Topics Concern  . Not on file  Social History Narrative   Married   Patient is right handed.   Patient rarely drinks caffeine.   Lives at home with husband in two story home. Husband washes laundry in the basement. They have 3 daughters and 7 grandchildren.    Social Determinants of Health   Financial Resource Strain: Low Risk   . Difficulty of Paying Living Expenses: Not hard at all  Food Insecurity: Not on file  Transportation Needs: No Transportation Needs  . Lack of Transportation (Medical): No  . Lack of Transportation (Non-Medical): No  Physical Activity: Not on file  Stress: Not on file  Social Connections: Not on file  Intimate Partner Violence: Not on file      Family History  Problem Relation Age of Onset  . Heart failure Mother   . Hypertension Mother   . Skin cancer Mother   . Colitis Mother   . Diabetes Mother   . Kidney Stones Mother   . Stroke Mother   . Heart disease Father   . Colon polyps Father   . Diabetes Father   . Heart failure Father   . Heart attack Sister   . Stroke Maternal Grandfather   . Diabetes Maternal Grandfather   . Diabetes Daughter        prediabetes  . Colon cancer Neg Hx     Vitals:   09/03/20 1420  Pulse: 72  SpO2: 98%  Weight: 84.7 kg     PHYSICAL EXAM: ReDs Clip 39%  General:  Well appearing, obese WF in wheel chair. No respiratory difficulty HEENT: normal Neck: supple. JVD 8-9 cm. Carotids 2+ bilat; no bruits. No lymphadenopathy or thyromegaly appreciated. Cor: PMI nondisplaced. Regular rate & rhythm. No rubs, gallops or murmurs. Lungs: clear Abdomen: obese, soft, nontender, nondistended. No hepatosplenomegaly. No bruits or masses.  Good bowel sounds. Extremities: no cyanosis, clubbing, rash, trace-1+ Lt LE edema Neuro: alert & oriented x 3, cranial nerves grossly intact. moves all 4 extremities w/o difficulty. Affect pleasant.    ASSESSMENT & PLAN:  1. Chronic Diastolic Heart Failure - echo 11/21 EF 60-65% - multiple admissions for CHF in past year, has and continues to refuse CardioMEMS - mildly fluid overloaded on exam and by ReDs Clip, 39%. NYHA Class III, confounding by morbid obesity and deconditioning - increase torsemide to 40 mg bid.  - check BMP today and again in 7 days   2. PAF - RRR on exam  - continue amiodarone + eliquis - check TSH, HFTs today. Needs annual eye exam - check CBC    3. TTR Cardiac Amyloidosis  - PYP study abnormal, myeloma panel negative. 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  - She is on tafamidis, continue.   4. H/o Aortic Stenosis S/p TAVR -  echo 11/21 showed gradients across the TAVR prosthesis have increased and dimensionless index has worsened compared to 12/10/2019. By TEE, The TAVR valve appears to open relatively normally, but has a significantly elevated mean gradient (29 mmHg). The valve is relatively small  and also suspect volume overload with high flow. The valve does not appear thrombosed.  5. PAD - S/p PTCA to right popliteal and right AT in 7/20. Now with left distal SFA-TP trunk bypass and left TMA in 2/21.  - she is being followed closely by VVS at [facility] and may need possible BKA in hear future if chronic LLE wound does not heal.  6. CKD, Stage III  - check BMP today   7. HTN - controlled on current regimen   8. OSA - reports compliance w/ CPAP, continue nightly   F/u in 4-6 weeks    Lyda Jester, PA-C 09/03/20

## 2020-09-04 ENCOUNTER — Ambulatory Visit: Payer: Medicare Other | Admitting: Cardiology

## 2020-09-04 DIAGNOSIS — E1151 Type 2 diabetes mellitus with diabetic peripheral angiopathy without gangrene: Secondary | ICD-10-CM | POA: Diagnosis not present

## 2020-09-04 DIAGNOSIS — I70201 Unspecified atherosclerosis of native arteries of extremities, right leg: Secondary | ICD-10-CM | POA: Diagnosis not present

## 2020-09-04 DIAGNOSIS — L97828 Non-pressure chronic ulcer of other part of left lower leg with other specified severity: Secondary | ICD-10-CM | POA: Diagnosis not present

## 2020-09-04 DIAGNOSIS — Z4800 Encounter for change or removal of nonsurgical wound dressing: Secondary | ICD-10-CM | POA: Diagnosis not present

## 2020-09-04 DIAGNOSIS — L97228 Non-pressure chronic ulcer of left calf with other specified severity: Secondary | ICD-10-CM | POA: Diagnosis not present

## 2020-09-04 DIAGNOSIS — I872 Venous insufficiency (chronic) (peripheral): Secondary | ICD-10-CM | POA: Diagnosis not present

## 2020-09-07 ENCOUNTER — Other Ambulatory Visit (HOSPITAL_COMMUNITY): Payer: Self-pay | Admitting: Cardiology

## 2020-09-07 DIAGNOSIS — I5032 Chronic diastolic (congestive) heart failure: Secondary | ICD-10-CM

## 2020-09-08 DIAGNOSIS — E1151 Type 2 diabetes mellitus with diabetic peripheral angiopathy without gangrene: Secondary | ICD-10-CM | POA: Diagnosis not present

## 2020-09-08 DIAGNOSIS — L97828 Non-pressure chronic ulcer of other part of left lower leg with other specified severity: Secondary | ICD-10-CM | POA: Diagnosis not present

## 2020-09-08 DIAGNOSIS — I70201 Unspecified atherosclerosis of native arteries of extremities, right leg: Secondary | ICD-10-CM | POA: Diagnosis not present

## 2020-09-08 DIAGNOSIS — Z4800 Encounter for change or removal of nonsurgical wound dressing: Secondary | ICD-10-CM | POA: Diagnosis not present

## 2020-09-08 DIAGNOSIS — I872 Venous insufficiency (chronic) (peripheral): Secondary | ICD-10-CM | POA: Diagnosis not present

## 2020-09-08 DIAGNOSIS — L97228 Non-pressure chronic ulcer of left calf with other specified severity: Secondary | ICD-10-CM | POA: Diagnosis not present

## 2020-09-08 MED FILL — VYNDAMAX 61 MG CAPS: 61 | 30 days supply | Qty: 30 | Fill #1

## 2020-09-09 ENCOUNTER — Telehealth (HOSPITAL_COMMUNITY): Payer: Self-pay | Admitting: Cardiology

## 2020-09-09 DIAGNOSIS — L97221 Non-pressure chronic ulcer of left calf limited to breakdown of skin: Secondary | ICD-10-CM | POA: Diagnosis not present

## 2020-09-09 NOTE — Telephone Encounter (Signed)
[facility] vo given to continue PT 1/wk for 3 weeks  Deatra Canter [phone]

## 2020-09-11 ENCOUNTER — Other Ambulatory Visit: Payer: Medicare Other

## 2020-09-11 ENCOUNTER — Other Ambulatory Visit: Payer: Self-pay | Admitting: Family Medicine

## 2020-09-11 ENCOUNTER — Other Ambulatory Visit: Payer: Self-pay

## 2020-09-11 DIAGNOSIS — I5032 Chronic diastolic (congestive) heart failure: Secondary | ICD-10-CM | POA: Diagnosis not present

## 2020-09-11 DIAGNOSIS — I70201 Unspecified atherosclerosis of native arteries of extremities, right leg: Secondary | ICD-10-CM | POA: Diagnosis not present

## 2020-09-11 DIAGNOSIS — L97228 Non-pressure chronic ulcer of left calf with other specified severity: Secondary | ICD-10-CM | POA: Diagnosis not present

## 2020-09-11 DIAGNOSIS — E1151 Type 2 diabetes mellitus with diabetic peripheral angiopathy without gangrene: Secondary | ICD-10-CM | POA: Diagnosis not present

## 2020-09-11 DIAGNOSIS — L97828 Non-pressure chronic ulcer of other part of left lower leg with other specified severity: Secondary | ICD-10-CM | POA: Diagnosis not present

## 2020-09-11 DIAGNOSIS — Z4800 Encounter for change or removal of nonsurgical wound dressing: Secondary | ICD-10-CM | POA: Diagnosis not present

## 2020-09-11 DIAGNOSIS — I872 Venous insufficiency (chronic) (peripheral): Secondary | ICD-10-CM | POA: Diagnosis not present

## 2020-09-12 LAB — CBC
Hematocrit: 28.8 % — ABNORMAL LOW (ref 34.0–46.6)
Hemoglobin: 8.4 g/dL — CL (ref 11.1–15.9)
MCH: 23.7 pg — ABNORMAL LOW (ref 26.6–33.0)
MCHC: 29.2 g/dL — ABNORMAL LOW (ref 31.5–35.7)
MCV: 81 fL (ref 79–97)
Platelets: 420 10*3/uL (ref 150–450)
RBC: 3.55 x10E6/uL — ABNORMAL LOW (ref 3.77–5.28)
RDW: 16.7 % — ABNORMAL HIGH (ref 11.7–15.4)
WBC: 9 10*3/uL (ref 3.4–10.8)

## 2020-09-12 LAB — SPECIMEN STATUS REPORT

## 2020-09-13 DIAGNOSIS — M479 Spondylosis, unspecified: Secondary | ICD-10-CM | POA: Diagnosis not present

## 2020-09-13 DIAGNOSIS — I130 Hypertensive heart and chronic kidney disease with heart failure and stage 1 through stage 4 chronic kidney disease, or unspecified chronic kidney disease: Secondary | ICD-10-CM | POA: Diagnosis not present

## 2020-09-13 DIAGNOSIS — L97228 Non-pressure chronic ulcer of left calf with other specified severity: Secondary | ICD-10-CM | POA: Diagnosis not present

## 2020-09-13 DIAGNOSIS — D509 Iron deficiency anemia, unspecified: Secondary | ICD-10-CM | POA: Diagnosis not present

## 2020-09-13 DIAGNOSIS — I081 Rheumatic disorders of both mitral and tricuspid valves: Secondary | ICD-10-CM | POA: Diagnosis not present

## 2020-09-13 DIAGNOSIS — L97828 Non-pressure chronic ulcer of other part of left lower leg with other specified severity: Secondary | ICD-10-CM | POA: Diagnosis not present

## 2020-09-13 DIAGNOSIS — E1165 Type 2 diabetes mellitus with hyperglycemia: Secondary | ICD-10-CM | POA: Diagnosis not present

## 2020-09-13 DIAGNOSIS — I700 Atherosclerosis of aorta: Secondary | ICD-10-CM | POA: Diagnosis not present

## 2020-09-13 DIAGNOSIS — R000 Tachycardia, unspecified: Secondary | ICD-10-CM | POA: Diagnosis not present

## 2020-09-13 DIAGNOSIS — E1122 Type 2 diabetes mellitus with diabetic chronic kidney disease: Secondary | ICD-10-CM | POA: Diagnosis not present

## 2020-09-13 DIAGNOSIS — I2510 Atherosclerotic heart disease of native coronary artery without angina pectoris: Secondary | ICD-10-CM | POA: Diagnosis not present

## 2020-09-13 DIAGNOSIS — E1151 Type 2 diabetes mellitus with diabetic peripheral angiopathy without gangrene: Secondary | ICD-10-CM | POA: Diagnosis not present

## 2020-09-13 DIAGNOSIS — I959 Hypotension, unspecified: Secondary | ICD-10-CM | POA: Diagnosis not present

## 2020-09-13 DIAGNOSIS — K219 Gastro-esophageal reflux disease without esophagitis: Secondary | ICD-10-CM | POA: Diagnosis not present

## 2020-09-13 DIAGNOSIS — G8929 Other chronic pain: Secondary | ICD-10-CM | POA: Diagnosis not present

## 2020-09-13 DIAGNOSIS — I5033 Acute on chronic diastolic (congestive) heart failure: Secondary | ICD-10-CM | POA: Diagnosis not present

## 2020-09-13 DIAGNOSIS — E785 Hyperlipidemia, unspecified: Secondary | ICD-10-CM | POA: Diagnosis not present

## 2020-09-13 DIAGNOSIS — I480 Paroxysmal atrial fibrillation: Secondary | ICD-10-CM | POA: Diagnosis not present

## 2020-09-13 DIAGNOSIS — I872 Venous insufficiency (chronic) (peripheral): Secondary | ICD-10-CM | POA: Diagnosis not present

## 2020-09-13 DIAGNOSIS — Z4800 Encounter for change or removal of nonsurgical wound dressing: Secondary | ICD-10-CM | POA: Diagnosis not present

## 2020-09-13 DIAGNOSIS — I272 Pulmonary hypertension, unspecified: Secondary | ICD-10-CM | POA: Diagnosis not present

## 2020-09-13 DIAGNOSIS — G4733 Obstructive sleep apnea (adult) (pediatric): Secondary | ICD-10-CM | POA: Diagnosis not present

## 2020-09-13 DIAGNOSIS — N184 Chronic kidney disease, stage 4 (severe): Secondary | ICD-10-CM | POA: Diagnosis not present

## 2020-09-13 DIAGNOSIS — L03116 Cellulitis of left lower limb: Secondary | ICD-10-CM | POA: Diagnosis not present

## 2020-09-13 DIAGNOSIS — D631 Anemia in chronic kidney disease: Secondary | ICD-10-CM | POA: Diagnosis not present

## 2020-09-14 ENCOUNTER — Ambulatory Visit: Payer: Medicare Other | Admitting: Family Medicine

## 2020-09-14 ENCOUNTER — Telehealth (HOSPITAL_COMMUNITY): Payer: Self-pay

## 2020-09-14 NOTE — Progress Notes (Deleted)
Subjective: CC:*** PCP: [name], DO [id number] [name] is a 70 y.o. female presenting to clinic today for:  1. ***   ROS: Per HPI  Allergies  Allergen Reactions  . Amlodipine Swelling    Ankle swelling  . Atorvastatin Other (See Comments)    Myalgias   . Crestor [Rosuvastatin Calcium] Other (See Comments)    Stiffness and back pain  . Ezetimibe-Simvastatin Other (See Comments)    Leg cramps  . Sitagliptin Other (See Comments)    Unknown reaction  . Celebrex [Celecoxib] Rash  . Levemir [Insulin Detemir] Rash   Past Medical History:  Diagnosis Date  . Atrial fibrillation ([place])    a. maintaining sinus s/Alison DCCV, on Eliquis  . CAD (coronary artery disease)    a. Nonobstructive by cath 2006 - 25% LAD, 25-30% after 1st diag, distal 25% PDA, minor irreg LCx. b. Normal nuc 2011.  . Cellulitis and abscess of left leg 07/2020  . CHF (congestive heart failure) ([place])   . CKD (chronic kidney disease), stage III ([place])   . Cystocele   . Difficult intubation 09-07-2012   big neck trouble with intubation parotid surgery"takes little med to sedate"  . Esophageal stricture   . GERD (gastroesophageal reflux disease)   . Hiatal hernia    a. 07/2013: HH with small stricture holding up barium tablet (concides with patient's sx of food sticking).  . History of kidney stones   . Hyperlipidemia   . Hypertension   . IBS (irritable bowel syndrome)   . Memory difficulty 11/26/2015  . Morbid obesity ([place])    a. hx of gastric bypass (sleeve gastrectomy 2015)  . Non-STEMI (non-ST elevated myocardial infarction) ([place]) 01/16/2020  . Osteoporosis   . 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  Marland Kitchen Pericardial effusion    a. Mod by echo 2012 at time of PNA. b. Echo 07/2012: small pericardial effusion vs fat.  . Pressure ulcer 04/16/2019   right foot  . S/Alison TAVR (transcatheter aortic valve replacement)   . Severe aortic stenosis   . Sleep apnea     "have mask; don't  use it" (02/14/2018)  . TIA (transient ischemic attack) 10/2014; 06/2016   "some memory issues since; daughter says my speech is sometimes different" (02/14/2018)  . Type II diabetes mellitus ([place])     Current Outpatient Medications:  .  ACCU-CHEK AVIVA PLUS test strip, USE TO CHECK BLOOD SUGAR UP TO TWICE DAILY OR AS INSTRUCTED (Patient taking differently: 1 each by Other route 2 (two) times daily.), Disp: 100 each, Rfl: 2 .  Alirocumab (PRALUENT) 75 MG/ML SOAJ, Inject 75 mg into the skin every 14 (fourteen) days., Disp: 2 mL, Rfl: 6 .  amiodarone (PACERONE) 200 MG tablet, Take 200 mg by mouth daily., Disp: , Rfl:  .  apixaban (ELIQUIS) 5 MG TABS tablet, Take 1 tablet (5 mg total) by mouth 2 (two) times daily., Disp: 180 tablet, Rfl: 1 .  B-D INS SYR ULTRAFINE 1CC/31G 31G X 5/16" 1 ML MISC, USE TO INJECT INSULIN TWICE A DAY AS INSTRUCTED (Patient taking differently: 1 Stick by Other route 3 (three) times daily before meals.), Disp: 100 each, Rfl: 2 .  Calcium Carbonate (CALCIUM 600 PO), Take 1 tablet by mouth daily., Disp: , Rfl:  .  Cholecalciferol (VITAMIN D3 PO), Take 1 tablet by mouth daily. , Disp: , Rfl:  .  hydrALAZINE (APRESOLINE) 50 MG tablet, Take 50 mg by mouth in the morning  and at bedtime., Disp: , Rfl:  .  insulin NPH Human (NOVOLIN N) 100 UNIT/ML injection, Inject 0.25 mLs (25 Units total) into the skin 2 (two) times daily at 8 am and 10 pm. (Patient taking differently: Inject 50 Units into the skin 2 (two) times daily at 8 am and 10 pm.), Disp: 10 mL, Rfl: 11 .  Insulin Pen Needle 32G X 4 MM MISC, Use to inject insulin with insulin pen, Disp: 100 each, Rfl: 4 .  LIVALO 4 MG TABS, TAKE 1 TABLET BY MOUTH EVERY DAY, Disp: 90 tablet, Rfl: 3 .  loratadine (CLARITIN) 10 MG tablet, Take 10 mg by mouth daily. , Disp: , Rfl:  .  meclizine (ANTIVERT) 12.5 MG tablet, Take 1 tablet (12.5 mg total) by mouth 3 (three) times daily as needed for dizziness., Disp: 30 tablet, Rfl: 5 .  Multiple  Vitamins-Minerals (MULTIVITAMIN GUMMIES ADULT PO), Take 2 each by mouth daily. , Disp: , Rfl:  .  NOVOLIN R RELION 100 UNIT/ML injection, Inject 20-30 Units into the skin 2 (two) times daily before a meal. Per sliding scale, Disp: , Rfl:  .  nutrition supplement, JUVEN, (JUVEN) PACK, Take 1 packet by mouth 2 (two) times daily between meals., Disp: 60 packet, Rfl: 0 .  pantoprazole (PROTONIX) 40 MG tablet, Take 1 tablet (40 mg total) by mouth daily., Disp: 30 tablet, Rfl: 11 .  RELION INSULIN SYRINGE 31G X 15/64" 1 ML MISC, Inject 1 Syringe into the skin 3 (three) times daily., Disp: , Rfl:  .  torsemide (DEMADEX) 20 MG tablet, Take 2 tablets (40 mg total) by mouth 2 (two) times daily., Disp: 120 tablet, Rfl: 5 .  triamcinolone (KENALOG) 0.1 %, Apply 1 application topically daily., Disp: , Rfl:  .  VASCEPA 1 g capsule, TAKE 2 CAPSULES (2 G TOTAL) BY MOUTH 2 (TWO) TIMES A DAY., Disp: 120 capsule, Rfl: 6 .  VYNDAMAX 61 MG CAPS, TAKE 1 CAPSULE (61 MG) BY MOUTH DAILY., Disp: 30 capsule, Rfl: 11 Social History   Socioeconomic History  . Marital status: Married    Spouse name: Not on file  . Number of children: 3  . Years of education: some college  . Highest education level: Some college, no degree  Occupational History  . Occupation: retired    Fish farm manager: [employer]  Tobacco Use  . Smoking status: Never Smoker  . Smokeless tobacco: Never Used  Vaping Use  . Vaping Use: Never used  Substance and Sexual Activity  . Alcohol use: Not Currently    Alcohol/week: 0.0 standard drinks    Comment:  "mixed drink q few years"  . Drug use: Never  . Sexual activity: Yes    Birth control/protection: Post-menopausal  Other Topics Concern  . Not on file  Social History Narrative   Married   Patient is right handed.   Patient rarely drinks caffeine.   Lives at home with husband in two story home. Husband washes laundry in the basement. They have 3 daughters and 7 grandchildren.    Social  Determinants of Health   Financial Resource Strain: Low Risk   . Difficulty of Paying Living Expenses: Not hard at all  Food Insecurity: Not on file  Transportation Needs: No Transportation Needs  . Lack of Transportation (Medical): No  . Lack of Transportation (Non-Medical): No  Physical Activity: Not on file  Stress: Not on file  Social Connections: Not on file  Intimate Partner Violence: Not on file   Family History  Problem Relation Age of Onset  . Heart failure Mother   . Hypertension Mother   . Skin cancer Mother   . Colitis Mother   . Diabetes Mother   . Kidney Stones Mother   . Stroke Mother   . Heart disease Father   . Colon polyps Father   . Diabetes Father   . Heart failure Father   . Heart attack Sister   . Stroke Maternal Grandfather   . Diabetes Maternal Grandfather   . Diabetes Daughter        prediabetes  . Colon cancer Neg Hx     Objective: Office vital signs reviewed. There were no vitals taken for this visit.  Physical Examination:  General: Awake, alert, *** nourished, No acute distress HEENT: Normal    Neck: No masses palpated. No lymphadenopathy    Ears: Tympanic membranes intact, normal light reflex, no erythema, no bulging    Eyes: PERRLA, extraocular membranes intact, sclera ***    Nose: nasal turbinates moist, *** nasal discharge    Throat: moist mucus membranes, no erythema, *** tonsillar exudate.  Airway is patent Cardio: regular rate and rhythm, S1S2 heard, no murmurs appreciated Pulm: clear to auscultation bilaterally, no wheezes, rhonchi or rales; normal work of breathing on room air GI: soft, non-tender, non-distended, bowel sounds present x4, no hepatomegaly, no splenomegaly, no masses GU: external vaginal tissue ***, cervix ***, *** punctate lesions on cervix appreciated, *** discharge from cervical os, *** bleeding, *** cervical motion tenderness, *** abdominal/ adnexal masses Extremities: warm, well perfused, No edema, cyanosis or  clubbing; +*** pulses bilaterally MSK: *** gait and *** station Skin: dry; intact; no rashes or lesions Neuro: *** Strength and light touch sensation grossly intact, *** DTRs ***/4  Assessment/ Plan: 70 y.o. female   ***  No orders of the defined types were placed in this encounter.  No orders of the defined types were placed in this encounter.    [name], DO [place] [phone]

## 2020-09-14 NOTE — Telephone Encounter (Signed)
Malena Edman, RN  09/14/2020 10:03 AM EST Back to Top     Patient advised and verbalized understanding. Patient denies blood in stools. Pt states she has just started taking iron today.

## 2020-09-14 NOTE — Telephone Encounter (Signed)
-----   Message from Rafael Bihari, [place] sent at 09/13/2020  1:57 PM EST ----- Hgb still low, microcytic anemia likely from iron deficiency or renal disease. Is she having any bleeding with BMs? She will need to follow up with her PCP asap, may need GI visit.

## 2020-09-15 ENCOUNTER — Encounter: Payer: Self-pay | Admitting: Family Medicine

## 2020-09-15 DIAGNOSIS — I872 Venous insufficiency (chronic) (peripheral): Secondary | ICD-10-CM | POA: Diagnosis not present

## 2020-09-15 DIAGNOSIS — E1151 Type 2 diabetes mellitus with diabetic peripheral angiopathy without gangrene: Secondary | ICD-10-CM | POA: Diagnosis not present

## 2020-09-15 DIAGNOSIS — L97228 Non-pressure chronic ulcer of left calf with other specified severity: Secondary | ICD-10-CM | POA: Diagnosis not present

## 2020-09-15 DIAGNOSIS — L03116 Cellulitis of left lower limb: Secondary | ICD-10-CM | POA: Diagnosis not present

## 2020-09-15 DIAGNOSIS — Z4800 Encounter for change or removal of nonsurgical wound dressing: Secondary | ICD-10-CM | POA: Diagnosis not present

## 2020-09-15 DIAGNOSIS — L97828 Non-pressure chronic ulcer of other part of left lower leg with other specified severity: Secondary | ICD-10-CM | POA: Diagnosis not present

## 2020-09-16 ENCOUNTER — Encounter: Payer: Self-pay | Admitting: Family Medicine

## 2020-09-16 ENCOUNTER — Ambulatory Visit (INDEPENDENT_AMBULATORY_CARE_PROVIDER_SITE_OTHER): Payer: Medicare Other | Admitting: Family Medicine

## 2020-09-16 ENCOUNTER — Other Ambulatory Visit: Payer: Self-pay

## 2020-09-16 VITALS — BP 146/72 | HR 99 | Temp 97.4°F | Resp 20 | Ht 66.0 in | Wt 186.0 lb

## 2020-09-16 DIAGNOSIS — J0100 Acute maxillary sinusitis, unspecified: Secondary | ICD-10-CM | POA: Diagnosis not present

## 2020-09-16 DIAGNOSIS — L249 Irritant contact dermatitis, unspecified cause: Secondary | ICD-10-CM | POA: Diagnosis not present

## 2020-09-16 MED ORDER — PREDNISONE 10 MG PO TABS: ORAL_TABLET | ORAL | 0 refills | Status: DC

## 2020-09-16 MED ORDER — SULFAMETHOXAZOLE-TRIMETHOPRIM 800-160 MG PO TABS: 1.0000 | ORAL_TABLET | Freq: Two times a day (BID) | ORAL | 0 refills | Status: DC

## 2020-09-16 NOTE — Progress Notes (Signed)
Chief Complaint  Patient presents with  . Places on shoulders and back    Very itchy     HPI  Patient presents today for itching places since dermatologist froze them last month. Was given triamcinolone, but didn't help. Also having posterior drainage. Hacks it up and it is yellow and thick. Onset was 2 weeks ago.   PMH: Smoking status noted ROS: Per HPI  Objective: BP (!) 146/72   Pulse 99   Temp (!) 97.4 F (36.3 C) (Temporal)   Resp 20   Ht 5\' 6"  (1.676 m)   Wt 186 lb (84.4 kg)   SpO2 94%   BMI 30.02 kg/m  Gen: NAD, alert, cooperative with exam HEENT: NCAT, EOMI, PERRL. Nasal passages erythemtous. Pharynx clear. TMs nml CV: RRR, good S1/S2, no murmur Resp: CTABL, no wheezes, non-labored Skin: scattered papules scant over posterior shoulders , one measuring 8 mm at left shoulder is crusted. Neuro: Alert and oriented, No gross deficits  Assessment and plan:  1. Acute maxillary sinusitis, recurrence not specified   2. Irritant contact dermatitis, unspecified trigger     Meds ordered this encounter  Medications  . sulfamethoxazole-trimethoprim (BACTRIM DS) 800-160 MG tablet    Sig: Take 1 tablet by mouth 2 (two) times daily. Until gone, for infection    Dispense:  20 tablet    Refill:  0  . predniSONE (DELTASONE) 10 MG tablet    Sig: Take 5 daily for 2 days followed by 4,3,2 and 1 for 2 days each.    Dispense:  30 tablet    Refill:  0    No orders of the defined types were placed in this encounter.   Follow up as needed.  Claretta Fraise, MD

## 2020-09-18 DIAGNOSIS — L97828 Non-pressure chronic ulcer of other part of left lower leg with other specified severity: Secondary | ICD-10-CM | POA: Diagnosis not present

## 2020-09-18 DIAGNOSIS — I872 Venous insufficiency (chronic) (peripheral): Secondary | ICD-10-CM | POA: Diagnosis not present

## 2020-09-18 DIAGNOSIS — E1151 Type 2 diabetes mellitus with diabetic peripheral angiopathy without gangrene: Secondary | ICD-10-CM | POA: Diagnosis not present

## 2020-09-18 DIAGNOSIS — L03116 Cellulitis of left lower limb: Secondary | ICD-10-CM | POA: Diagnosis not present

## 2020-09-18 DIAGNOSIS — Z4800 Encounter for change or removal of nonsurgical wound dressing: Secondary | ICD-10-CM | POA: Diagnosis not present

## 2020-09-18 DIAGNOSIS — L97228 Non-pressure chronic ulcer of left calf with other specified severity: Secondary | ICD-10-CM | POA: Diagnosis not present

## 2020-09-20 ENCOUNTER — Emergency Department (HOSPITAL_COMMUNITY): Payer: Medicare Other

## 2020-09-20 ENCOUNTER — Encounter (HOSPITAL_COMMUNITY): Payer: Self-pay | Admitting: Emergency Medicine

## 2020-09-20 ENCOUNTER — Inpatient Hospital Stay (HOSPITAL_COMMUNITY)
Admission: EM | Admit: 2020-09-20 | Discharge: 2020-10-13 | DRG: 291 | Disposition: E | Payer: Medicare Other | Attending: Internal Medicine | Admitting: Internal Medicine

## 2020-09-20 ENCOUNTER — Other Ambulatory Visit: Payer: Self-pay

## 2020-09-20 DIAGNOSIS — Z9862 Peripheral vascular angioplasty status: Secondary | ICD-10-CM

## 2020-09-20 DIAGNOSIS — Z7901 Long term (current) use of anticoagulants: Secondary | ICD-10-CM

## 2020-09-20 DIAGNOSIS — E854 Organ-limited amyloidosis: Secondary | ICD-10-CM | POA: Diagnosis present

## 2020-09-20 DIAGNOSIS — Z049 Encounter for examination and observation for unspecified reason: Secondary | ICD-10-CM | POA: Diagnosis not present

## 2020-09-20 DIAGNOSIS — I482 Chronic atrial fibrillation, unspecified: Secondary | ICD-10-CM | POA: Diagnosis present

## 2020-09-20 DIAGNOSIS — Z20822 Contact with and (suspected) exposure to covid-19: Secondary | ICD-10-CM | POA: Diagnosis present

## 2020-09-20 DIAGNOSIS — D631 Anemia in chronic kidney disease: Secondary | ICD-10-CM | POA: Diagnosis present

## 2020-09-20 DIAGNOSIS — L97929 Non-pressure chronic ulcer of unspecified part of left lower leg with unspecified severity: Secondary | ICD-10-CM | POA: Diagnosis present

## 2020-09-20 DIAGNOSIS — Z87442 Personal history of urinary calculi: Secondary | ICD-10-CM | POA: Diagnosis not present

## 2020-09-20 DIAGNOSIS — I10 Essential (primary) hypertension: Secondary | ICD-10-CM | POA: Diagnosis not present

## 2020-09-20 DIAGNOSIS — Z9861 Coronary angioplasty status: Secondary | ICD-10-CM

## 2020-09-20 DIAGNOSIS — E66813 Obesity, class 3: Secondary | ICD-10-CM | POA: Diagnosis present

## 2020-09-20 DIAGNOSIS — I252 Old myocardial infarction: Secondary | ICD-10-CM

## 2020-09-20 DIAGNOSIS — I480 Paroxysmal atrial fibrillation: Secondary | ICD-10-CM | POA: Diagnosis present

## 2020-09-20 DIAGNOSIS — I4891 Unspecified atrial fibrillation: Secondary | ICD-10-CM | POA: Diagnosis not present

## 2020-09-20 DIAGNOSIS — L97909 Non-pressure chronic ulcer of unspecified part of unspecified lower leg with unspecified severity: Secondary | ICD-10-CM | POA: Diagnosis present

## 2020-09-20 DIAGNOSIS — I469 Cardiac arrest, cause unspecified: Secondary | ICD-10-CM | POA: Diagnosis not present

## 2020-09-20 DIAGNOSIS — E669 Obesity, unspecified: Secondary | ICD-10-CM | POA: Diagnosis present

## 2020-09-20 DIAGNOSIS — E1169 Type 2 diabetes mellitus with other specified complication: Secondary | ICD-10-CM | POA: Diagnosis present

## 2020-09-20 DIAGNOSIS — I248 Other forms of acute ischemic heart disease: Secondary | ICD-10-CM | POA: Diagnosis present

## 2020-09-20 DIAGNOSIS — D649 Anemia, unspecified: Secondary | ICD-10-CM | POA: Diagnosis not present

## 2020-09-20 DIAGNOSIS — I152 Hypertension secondary to endocrine disorders: Secondary | ICD-10-CM | POA: Diagnosis present

## 2020-09-20 DIAGNOSIS — G4733 Obstructive sleep apnea (adult) (pediatric): Secondary | ICD-10-CM | POA: Diagnosis present

## 2020-09-20 DIAGNOSIS — Z9841 Cataract extraction status, right eye: Secondary | ICD-10-CM

## 2020-09-20 DIAGNOSIS — E11622 Type 2 diabetes mellitus with other skin ulcer: Secondary | ICD-10-CM | POA: Diagnosis not present

## 2020-09-20 DIAGNOSIS — R0600 Dyspnea, unspecified: Secondary | ICD-10-CM

## 2020-09-20 DIAGNOSIS — I2510 Atherosclerotic heart disease of native coronary artery without angina pectoris: Secondary | ICD-10-CM | POA: Diagnosis present

## 2020-09-20 DIAGNOSIS — I5033 Acute on chronic diastolic (congestive) heart failure: Secondary | ICD-10-CM | POA: Diagnosis not present

## 2020-09-20 DIAGNOSIS — E1122 Type 2 diabetes mellitus with diabetic chronic kidney disease: Secondary | ICD-10-CM | POA: Diagnosis present

## 2020-09-20 DIAGNOSIS — E1159 Type 2 diabetes mellitus with other circulatory complications: Secondary | ICD-10-CM | POA: Diagnosis present

## 2020-09-20 DIAGNOSIS — J90 Pleural effusion, not elsewhere classified: Secondary | ICD-10-CM | POA: Diagnosis not present

## 2020-09-20 DIAGNOSIS — E871 Hypo-osmolality and hyponatremia: Secondary | ICD-10-CM | POA: Diagnosis present

## 2020-09-20 DIAGNOSIS — K219 Gastro-esophageal reflux disease without esophagitis: Secondary | ICD-10-CM | POA: Diagnosis present

## 2020-09-20 DIAGNOSIS — I130 Hypertensive heart and chronic kidney disease with heart failure and stage 1 through stage 4 chronic kidney disease, or unspecified chronic kidney disease: Principal | ICD-10-CM | POA: Diagnosis present

## 2020-09-20 DIAGNOSIS — Z8249 Family history of ischemic heart disease and other diseases of the circulatory system: Secondary | ICD-10-CM

## 2020-09-20 DIAGNOSIS — Z8371 Family history of colonic polyps: Secondary | ICD-10-CM

## 2020-09-20 DIAGNOSIS — Z953 Presence of xenogenic heart valve: Secondary | ICD-10-CM

## 2020-09-20 DIAGNOSIS — I509 Heart failure, unspecified: Secondary | ICD-10-CM

## 2020-09-20 DIAGNOSIS — R059 Cough, unspecified: Secondary | ICD-10-CM | POA: Diagnosis not present

## 2020-09-20 DIAGNOSIS — Z6832 Body mass index (BMI) 32.0-32.9, adult: Secondary | ICD-10-CM | POA: Diagnosis not present

## 2020-09-20 DIAGNOSIS — Z961 Presence of intraocular lens: Secondary | ICD-10-CM | POA: Diagnosis present

## 2020-09-20 DIAGNOSIS — Z794 Long term (current) use of insulin: Secondary | ICD-10-CM | POA: Diagnosis not present

## 2020-09-20 DIAGNOSIS — N179 Acute kidney failure, unspecified: Secondary | ICD-10-CM | POA: Diagnosis present

## 2020-09-20 DIAGNOSIS — I472 Ventricular tachycardia: Secondary | ICD-10-CM | POA: Diagnosis not present

## 2020-09-20 DIAGNOSIS — Z823 Family history of stroke: Secondary | ICD-10-CM

## 2020-09-20 DIAGNOSIS — Z9842 Cataract extraction status, left eye: Secondary | ICD-10-CM

## 2020-09-20 DIAGNOSIS — N184 Chronic kidney disease, stage 4 (severe): Secondary | ICD-10-CM | POA: Diagnosis present

## 2020-09-20 DIAGNOSIS — K589 Irritable bowel syndrome without diarrhea: Secondary | ICD-10-CM | POA: Diagnosis present

## 2020-09-20 DIAGNOSIS — I350 Nonrheumatic aortic (valve) stenosis: Secondary | ICD-10-CM | POA: Diagnosis not present

## 2020-09-20 DIAGNOSIS — M810 Age-related osteoporosis without current pathological fracture: Secondary | ICD-10-CM | POA: Diagnosis present

## 2020-09-20 DIAGNOSIS — J0100 Acute maxillary sinusitis, unspecified: Secondary | ICD-10-CM

## 2020-09-20 DIAGNOSIS — J189 Pneumonia, unspecified organism: Secondary | ICD-10-CM | POA: Diagnosis not present

## 2020-09-20 DIAGNOSIS — J329 Chronic sinusitis, unspecified: Secondary | ICD-10-CM | POA: Diagnosis present

## 2020-09-20 DIAGNOSIS — Z9049 Acquired absence of other specified parts of digestive tract: Secondary | ICD-10-CM

## 2020-09-20 DIAGNOSIS — N183 Chronic kidney disease, stage 3 unspecified: Secondary | ICD-10-CM | POA: Diagnosis not present

## 2020-09-20 DIAGNOSIS — E785 Hyperlipidemia, unspecified: Secondary | ICD-10-CM | POA: Diagnosis present

## 2020-09-20 DIAGNOSIS — Z9884 Bariatric surgery status: Secondary | ICD-10-CM

## 2020-09-20 DIAGNOSIS — Z79899 Other long term (current) drug therapy: Secondary | ICD-10-CM

## 2020-09-20 DIAGNOSIS — D62 Acute posthemorrhagic anemia: Secondary | ICD-10-CM

## 2020-09-20 DIAGNOSIS — I43 Cardiomyopathy in diseases classified elsewhere: Secondary | ICD-10-CM | POA: Diagnosis present

## 2020-09-20 DIAGNOSIS — R0602 Shortness of breath: Secondary | ICD-10-CM | POA: Diagnosis not present

## 2020-09-20 DIAGNOSIS — R079 Chest pain, unspecified: Secondary | ICD-10-CM | POA: Diagnosis not present

## 2020-09-20 DIAGNOSIS — Z833 Family history of diabetes mellitus: Secondary | ICD-10-CM

## 2020-09-20 DIAGNOSIS — Z8673 Personal history of transient ischemic attack (TIA), and cerebral infarction without residual deficits: Secondary | ICD-10-CM

## 2020-09-20 DIAGNOSIS — E1129 Type 2 diabetes mellitus with other diabetic kidney complication: Secondary | ICD-10-CM | POA: Diagnosis present

## 2020-09-20 DIAGNOSIS — Z808 Family history of malignant neoplasm of other organs or systems: Secondary | ICD-10-CM

## 2020-09-20 DIAGNOSIS — E1151 Type 2 diabetes mellitus with diabetic peripheral angiopathy without gangrene: Secondary | ICD-10-CM | POA: Diagnosis present

## 2020-09-20 DIAGNOSIS — Z888 Allergy status to other drugs, medicaments and biological substances status: Secondary | ICD-10-CM

## 2020-09-20 DIAGNOSIS — J180 Bronchopneumonia, unspecified organism: Secondary | ICD-10-CM | POA: Diagnosis not present

## 2020-09-20 DIAGNOSIS — I110 Hypertensive heart disease with heart failure: Secondary | ICD-10-CM | POA: Diagnosis not present

## 2020-09-20 DIAGNOSIS — E1165 Type 2 diabetes mellitus with hyperglycemia: Secondary | ICD-10-CM | POA: Diagnosis present

## 2020-09-20 LAB — CBC
HCT: 26.9 % — ABNORMAL LOW (ref 36.0–46.0)
Hemoglobin: 8.3 g/dL — ABNORMAL LOW (ref 12.0–15.0)
MCH: 24.1 pg — ABNORMAL LOW (ref 26.0–34.0)
MCHC: 30.9 g/dL (ref 30.0–36.0)
MCV: 78 fL — ABNORMAL LOW (ref 80.0–100.0)
Platelets: 346 10*3/uL (ref 150–400)
RBC: 3.45 MIL/uL — ABNORMAL LOW (ref 3.87–5.11)
RDW: 19.9 % — ABNORMAL HIGH (ref 11.5–15.5)
WBC: 6.9 10*3/uL (ref 4.0–10.5)
nRBC: 0 % (ref 0.0–0.2)

## 2020-09-20 LAB — BASIC METABOLIC PANEL
Anion gap: 17 — ABNORMAL HIGH (ref 5–15)
BUN: 84 mg/dL — ABNORMAL HIGH (ref 8–23)
CO2: 20 mmol/L — ABNORMAL LOW (ref 22–32)
Calcium: 8.5 mg/dL — ABNORMAL LOW (ref 8.9–10.3)
Chloride: 96 mmol/L — ABNORMAL LOW (ref 98–111)
Creatinine, Ser: 4.52 mg/dL — ABNORMAL HIGH (ref 0.44–1.00)
GFR, Estimated: 10 mL/min — ABNORMAL LOW (ref >60)
Glucose, Bld: 187 mg/dL — ABNORMAL HIGH (ref 70–99)
Potassium: 5.1 mmol/L (ref 3.5–5.1)
Sodium: 133 mmol/L — ABNORMAL LOW (ref 135–145)

## 2020-09-20 LAB — RETICULOCYTES
Immature Retic Fract: 39.1 % — ABNORMAL HIGH (ref 2.3–15.9)
RBC.: 3.24 MIL/uL — ABNORMAL LOW (ref 3.87–5.11)
Retic Count, Absolute: 178.5 10*3/uL (ref 19.0–186.0)
Retic Ct Pct: 5.5 % — ABNORMAL HIGH (ref 0.4–3.1)

## 2020-09-20 LAB — BRAIN NATRIURETIC PEPTIDE: B Natriuretic Peptide: 1011.7 pg/mL — ABNORMAL HIGH (ref 0.0–100.0)

## 2020-09-20 LAB — TROPONIN I (HIGH SENSITIVITY)
Troponin I (High Sensitivity): 106 ng/L (ref <18)
Troponin I (High Sensitivity): 122 ng/L (ref <18)

## 2020-09-20 LAB — SARS CORONAVIRUS 2 BY RT PCR (HOSPITAL ORDER, PERFORMED IN ~~LOC~~ HOSPITAL LAB): SARS Coronavirus 2: NEGATIVE

## 2020-09-20 LAB — CBG MONITORING, ED: Glucose-Capillary: 242 mg/dL — ABNORMAL HIGH (ref 70–99)

## 2020-09-20 MED ORDER — ONDANSETRON HCL 4 MG/2ML IJ SOLN
4.0000 mg | Freq: Four times a day (QID) | INTRAMUSCULAR | Status: DC | PRN
  Administered 2020-09-21 – 2020-09-22 (×2): 4 mg via INTRAVENOUS
  Filled 2020-09-20 (×2): qty 2

## 2020-09-20 MED ORDER — APIXABAN 5 MG PO TABS
5.0000 mg | ORAL_TABLET | Freq: Two times a day (BID) | ORAL | Status: DC
  Administered 2020-09-20 – 2020-09-21 (×2): 5 mg via ORAL
  Filled 2020-09-20 (×2): qty 1

## 2020-09-20 MED ORDER — COLLAGENASE 250 UNIT/GM EX OINT
1.0000 "application " | TOPICAL_OINTMENT | Freq: Every day | CUTANEOUS | Status: DC
  Administered 2020-09-21: 1 via TOPICAL
  Filled 2020-09-20: qty 30

## 2020-09-20 MED ORDER — SODIUM CHLORIDE 0.9% FLUSH: 3.0000 mL | INTRAVENOUS | Status: DC | PRN

## 2020-09-20 MED ORDER — FUROSEMIDE 10 MG/ML IJ SOLN: 40.0000 mg | Freq: Once | INTRAMUSCULAR | Status: DC

## 2020-09-20 MED ORDER — FUROSEMIDE 10 MG/ML IJ SOLN: 80.0000 mg | Freq: Every day | INTRAMUSCULAR | Status: DC

## 2020-09-20 MED ORDER — INSULIN NPH (HUMAN) (ISOPHANE) 100 UNIT/ML ~~LOC~~ SUSP
35.0000 [IU] | Freq: Every day | SUBCUTANEOUS | Status: DC
  Administered 2020-09-21: 35 [IU] via SUBCUTANEOUS
  Filled 2020-09-20: qty 10

## 2020-09-20 MED ORDER — FUROSEMIDE 10 MG/ML IJ SOLN
80.0000 mg | Freq: Once | INTRAMUSCULAR | Status: AC
  Administered 2020-09-20: 80 mg via INTRAVENOUS
  Filled 2020-09-20: qty 8

## 2020-09-20 MED ORDER — TAFAMIDIS 61 MG PO CAPS: 1.0000 | ORAL_CAPSULE | Freq: Every day | ORAL | Status: DC

## 2020-09-20 MED ORDER — ACETAMINOPHEN 325 MG PO TABS: 650.0000 mg | ORAL_TABLET | ORAL | Status: DC | PRN

## 2020-09-20 MED ORDER — SODIUM CHLORIDE 0.9 % IV SOLN: 250.0000 mL | INTRAVENOUS | Status: DC | PRN

## 2020-09-20 MED ORDER — INSULIN ASPART 100 UNIT/ML ~~LOC~~ SOLN
0.0000 [IU] | Freq: Three times a day (TID) | SUBCUTANEOUS | Status: DC
  Administered 2020-09-21: 11 [IU] via SUBCUTANEOUS
  Administered 2020-09-21 – 2020-09-22 (×2): 5 [IU] via SUBCUTANEOUS

## 2020-09-20 MED ORDER — FERROUS SULFATE 325 (65 FE) MG PO TABS
325.0000 mg | ORAL_TABLET | Freq: Two times a day (BID) | ORAL | Status: DC
  Filled 2020-09-20: qty 1

## 2020-09-20 MED ORDER — AMIODARONE HCL 200 MG PO TABS
200.0000 mg | ORAL_TABLET | Freq: Every day | ORAL | Status: DC
  Administered 2020-09-21: 200 mg via ORAL
  Filled 2020-09-20 (×2): qty 1

## 2020-09-20 MED ORDER — PANTOPRAZOLE SODIUM 40 MG PO TBEC
40.0000 mg | DELAYED_RELEASE_TABLET | Freq: Every day | ORAL | Status: DC
  Administered 2020-09-21 – 2020-09-22 (×2): 40 mg via ORAL
  Filled 2020-09-20 (×2): qty 1

## 2020-09-20 MED ORDER — SODIUM CHLORIDE 0.9% FLUSH
3.0000 mL | Freq: Two times a day (BID) | INTRAVENOUS | Status: DC
  Administered 2020-09-22: 3 mL via INTRAVENOUS

## 2020-09-20 MED ORDER — ICOSAPENT ETHYL 1 G PO CAPS
2.0000 g | ORAL_CAPSULE | Freq: Two times a day (BID) | ORAL | Status: DC
  Administered 2020-09-20: 2 g via ORAL
  Filled 2020-09-20 (×5): qty 2

## 2020-09-20 NOTE — ED Notes (Signed)
Notified Dr. Maryan Rued of Trop 106.

## 2020-09-20 NOTE — ED Triage Notes (Signed)
Pt to triage via [facility] from home.  Reports chest pain since 7am.  States she woke up coughing this morning and is having soreness to chest.  20g R FA.  Pt has ulcer on buttocks and L leg that she is going to [facility] for.  Currently taking prednisone.  Zofran 4mg  IV given PTA because she was nauseous while EMS going around [place].

## 2020-09-20 NOTE — ED Provider Notes (Signed)
[facility] EMERGENCY DEPARTMENT Provider Note   CSN: [id number] Arrival date & time: 09/23/2020  1642     History Chief Complaint  Patient presents with  . Chest Pain    [name] is a 70 y.o. female hx of CAD, DM, CHF with diastolic dysfunction, here presenting with shortness of breath and chest pain.  Patient woke up this morning and had chest pain.  She states that she also feels very short of breath as well.  She states that her legs has been swollen.  She has a known ulcer on the left leg that she has been followed with wound care.  She was given full dose aspirin prior to arrival.  Patient follows up with cardiology and has multiple admissions for CHF exacerbation.  She states that she has not weighed herself in the last several days.  Patient had no fever or productive cough.  Denies any Covid exposures.  The history is provided by the patient.       Past Medical History:  Diagnosis Date  . Atrial fibrillation ([place])    a. maintaining sinus s/p DCCV, on Eliquis  . CAD (coronary artery disease)    a. Nonobstructive by cath 2006 - 25% LAD, 25-30% after 1st diag, distal 25% PDA, minor irreg LCx. b. Normal nuc 2011.  . Cellulitis and abscess of left leg 07/2020  . CHF (congestive heart failure) ([place])   . CKD (chronic kidney disease), stage III ([place])   . Cystocele   . Difficult intubation 09-07-2012   big neck trouble with intubation parotid surgery"takes little med to sedate"  . Esophageal stricture   . GERD (gastroesophageal reflux disease)   . Hiatal hernia    a. 07/2013: HH with small stricture holding up barium tablet (concides with patient's sx of food sticking).  . History of kidney stones   . Hyperlipidemia   . Hypertension   . IBS (irritable bowel syndrome)   . Memory difficulty 11/26/2015  . Morbid obesity ([place])    a. hx of gastric bypass (sleeve gastrectomy 2015)  . Non-STEMI (non-ST elevated myocardial infarction) ([place]) 01/16/2020  .  Osteoporosis   . 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  Marland Kitchen Pericardial effusion    a. Mod by echo 2012 at time of PNA. b. Echo 07/2012: small pericardial effusion vs fat.  . Pressure ulcer 04/16/2019   right foot  . S/P TAVR (transcatheter aortic valve replacement)   . Severe aortic stenosis   . Sleep apnea     "have mask; don't use it" (02/14/2018)  . TIA (transient ischemic attack) 10/2014; 06/2016   "some memory issues since; daughter says my speech is sometimes different" (02/14/2018)  . Type II diabetes mellitus [facility])     Patient Active Problem List   Diagnosis Date Noted  . Cellulitis 07/21/2020  . Cellulitis of left lower leg 07/02/2020  . CKD stage 4 due to type 2 diabetes mellitus ([place]) 02/27/2020  . Essential hypertension 02/26/2020  . Class 3 obesity 02/26/2020  . Bradycardia 02/19/2020  . Hyperglycemia   . Hospital discharge follow-up 01/24/2020  . Non-STEMI (non-ST elevated myocardial infarction) ([place]) 01/16/2020  . NSTEMI (non-ST elevated myocardial infarction) ([place]) 01/16/2020  . Acute on chronic diastolic heart failure ([place]) 12/09/2019  . S/P transmetatarsal amputation of foot, left ([place]) 10/23/2019  . Subacute bacterial endocarditis 05/21/2019  . 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 Bacteremia due to Enterococcus   . Pressure injury of  skin 04/16/2019  . Infected wound 04/16/2019  . Weakness generalized 04/15/2019  . SIRS (systemic inflammatory response syndrome) ([place]) 04/15/2019  . Anasarca 09/19/2018  . Acute encephalopathy 06/16/2018  . 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 Acute on chronic diastolic CHF (congestive heart failure) ([place]) 05/10/2018  . Morbid obesity ([place])   . Hypertension associated with diabetes ([place])   . Chronic kidney disease   . Sleep apnea   . Hyperlipidemia   . GERD (gastroesophageal reflux disease)   . Persistent atrial fibrillation with rapid ventricular response ([place])   . S/P TAVR (transcatheter aortic  valve replacement)   . Severe aortic stenosis   . Vertigo 05/06/2015  . TIA (transient ischemic attack), history of 11/07/2014  . 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 CKD stage 3 due to type 2 diabetes mellitus ([place]) 06/03/2014  . Type II diabetes mellitus with renal manifestations ([place]) 06/03/2014  . Type II diabetes mellitus with peripheral circulatory disorder ([place]) 06/03/2014  . Hyperlipidemia associated with type 2 diabetes mellitus ([place]) 12/26/2008  . Benign essential HTN 12/26/2008  . Coronary artery disease, non-occlusive 12/26/2008  . Pericardial effusion 12/26/2008    Past Surgical History:  Procedure Laterality Date  . BREATH TEK H PYLORI N/A 07/29/2013   Procedure: [place];  Surgeon: Shann Medal, MD;  Location: [facility];  Service: General;  Laterality: N/A;  . CARDIAC CATHETERIZATION  2006  . CARDIOVERSION N/A 02/16/2018   Procedure: CARDIOVERSION;  Surgeon: Pixie Casino, MD;  Location: [facility] ENDOSCOPY;  Service: Cardiovascular;  Laterality: N/A;  . CARDIOVERSION N/A 02/25/2020   Procedure: CARDIOVERSION;  Surgeon: Larey Dresser, MD;  Location: [facility];  Service: Cardiovascular;  Laterality: N/A;  . CARPAL TUNNEL RELEASE Left 2011  . CATARACT EXTRACTION W/ INTRAOCULAR LENS  IMPLANT, BILATERAL Bilateral 2003   left  . CHOLECYSTECTOMY OPEN  1982  . COLONOSCOPY N/A 07/14/2015   Procedure: COLONOSCOPY;  Surgeon: Ladene Artist, MD;  Location: [facility] ENDOSCOPY;  Service: Endoscopy;  Laterality: N/A;  . ESOPHAGOGASTRODUODENOSCOPY N/A 12/27/2013   Procedure: ESOPHAGOGASTRODUODENOSCOPY (EGD);  Surgeon: Shann Medal, MD;  Location: [facility];  Service: General;  Laterality: N/A;  . ESOPHAGOGASTRODUODENOSCOPY (EGD) WITH ESOPHAGEAL DILATION    . INTRAOPERATIVE TRANSTHORACIC ECHOCARDIOGRAM  05/08/2018   Procedure: INTRAOPERATIVE TRANSTHORACIC ECHOCARDIOGRAM;  Surgeon: Burnell Blanks, MD;  Location: [facility];  Service: Open Heart Surgery;;  . IR  FLUORO GUIDE CV LINE RIGHT  04/25/2019  . IR REMOVAL TUN CV CATH W/O FL  06/20/2019  . IR US GUIDE VASC ACCESS RIGHT  04/25/2019  . LAPAROSCOPIC GASTRIC SLEEVE RESECTION N/A 01/20/2014   Procedure: LAPAROSCOPIC GASTRIC SLEEVE RESECTION;  Surgeon: Shann Medal, MD;  Location: [facility];  Service: General;  Laterality: N/A;  . PAROTIDECTOMY  09/07/2012   Procedure: PAROTIDECTOMY;  Surgeon: Melida Quitter, MD;  Location: [facility];  Service: ENT;  Laterality: Left;  LEFT PAROTIDECTOMY  . RIGHT HEART CATH N/A 09/25/2018   Procedure: RIGHT HEART CATH;  Surgeon: Larey Dresser, MD;  Location: [facility] CV LAB;  Service: Cardiovascular;  Laterality: N/A;  . RIGHT HEART CATH N/A 07/06/2020   Procedure: RIGHT HEART CATH;  Surgeon: Larey Dresser, MD;  Location: [facility] CV LAB;  Service: Cardiovascular;  Laterality: N/A;  . RIGHT/LEFT HEART CATH AND CORONARY ANGIOGRAPHY N/A 03/29/2018   Procedure: RIGHT/LEFT HEART CATH AND CORONARY ANGIOGRAPHY;  Surgeon: Burnell Blanks, MD;  Location: [facility] CV LAB;  Service: Cardiovascular;  Laterality: N/A;  .  TEE WITHOUT CARDIOVERSION N/A 02/16/2018   Procedure: TRANSESOPHAGEAL ECHOCARDIOGRAM (TEE);  Surgeon: Pixie Casino, MD;  Location: [facility];  Service: Cardiovascular;  Laterality: N/A;  . TEE WITHOUT CARDIOVERSION N/A 06/05/2019   Procedure: TRANSESOPHAGEAL ECHOCARDIOGRAM (TEE);  Surgeon: Sanda Klein, MD;  Location: [facility];  Service: Cardiovascular;  Laterality: N/A;  . TEE WITHOUT CARDIOVERSION N/A 04/24/2019   Procedure: TRANSESOPHAGEAL ECHOCARDIOGRAM (TEE);  Surgeon: Sanda Klein, MD;  Location: [facility];  Service: Cardiovascular;  Laterality: N/A;  . TEE WITHOUT CARDIOVERSION N/A 02/25/2020   Procedure: TRANSESOPHAGEAL ECHOCARDIOGRAM (TEE);  Surgeon: Larey Dresser, MD;  Location: [facility] ENDOSCOPY;  Service: Cardiovascular;  Laterality: N/A;  . TEE WITHOUT CARDIOVERSION N/A 07/08/2020   Procedure: TRANSESOPHAGEAL ECHOCARDIOGRAM  (TEE);  Surgeon: Larey Dresser, MD;  Location: [facility] ENDOSCOPY;  Service: Cardiovascular;  Laterality: N/A;  . TOE AMPUTATION Left 07/2017   great toe; [facility]   . TONSILLECTOMY  1968  . TRANSCATHETER AORTIC VALVE REPLACEMENT, TRANSFEMORAL  05/08/2018   Transcatheter Aortic Valve Replacement   . TRANSCATHETER AORTIC VALVE REPLACEMENT, TRANSFEMORAL N/A 05/08/2018   Procedure: TRANSCATHETER AORTIC VALVE REPLACEMENT, TRANSFEMORAL;  Surgeon: Burnell Blanks, MD;  Location: [facility];  Service: Open Heart Surgery;  Laterality: N/A;  . TUBAL LIGATION  yrs ago  . UPPER GI ENDOSCOPY  01/20/2014   Procedure: UPPER GI ENDOSCOPY;  Surgeon: Shann Medal, MD;  Location: [facility];  Service: General;;     OB History    Gravida  3   Para  3   Term  3   Preterm      AB      Living  3     SAB      IAB      Ectopic      Multiple      Live Births  3           Family History  Problem Relation Age of Onset  . Heart failure Mother   . Hypertension Mother   . Skin cancer Mother   . Colitis Mother   . Diabetes Mother   . Kidney Stones Mother   . Stroke Mother   . Heart disease Father   . Colon polyps Father   . Diabetes Father   . Heart failure Father   . Heart attack Sister   . Stroke Maternal Grandfather   . Diabetes Maternal Grandfather   . Diabetes Daughter        prediabetes  . Colon cancer Neg Hx     Social History   Tobacco Use  . Smoking status: Never Smoker  . Smokeless tobacco: Never Used  Vaping Use  . Vaping Use: Never used  Substance Use Topics  . Alcohol use: Not Currently    Alcohol/week: 0.0 standard drinks    Comment:  "mixed drink q few years"  . Drug use: Never    Home Medications Prior to Admission medications   Medication Sig Start Date End Date Taking? Authorizing Provider  ACCU-CHEK AVIVA PLUS test strip USE TO CHECK BLOOD SUGAR UP TO TWICE DAILY OR AS INSTRUCTED Patient taking differently: 1 each by Other route 2 (two) times daily.  09/04/17   Chipper Herb, MD  Alirocumab (PRALUENT) 75 MG/ML SOAJ Inject 75 mg into the skin every 14 (fourteen) days. 07/13/20   Larey Dresser, MD  amiodarone (PACERONE) 200 MG tablet Take 200 mg by mouth daily.    [provider]  apixaban (ELIQUIS) 5 MG TABS tablet Take 1  tablet (5 mg total) by mouth 2 (two) times daily. 09/02/20   Minus Breeding, MD  B-D INS SYR ULTRAFINE 1CC/31G 31G X 5/16" 1 ML MISC USE TO INJECT INSULIN TWICE A DAY AS INSTRUCTED Patient taking differently: 1 Stick by Other route 3 (three) times daily before meals. 11/16/15   Chipper Herb, MD  Calcium Carbonate (CALCIUM 600 PO) Take 1 tablet by mouth daily.    [provider]  Cholecalciferol (VITAMIN D3 PO) Take 1 tablet by mouth daily.     [provider]  hydrALAZINE (APRESOLINE) 50 MG tablet Take 50 mg by mouth in the morning and at bedtime.    [provider]  insulin NPH Human (NOVOLIN N) 100 UNIT/ML injection Inject 0.25 mLs (25 Units total) into the skin 2 (two) times daily at 8 am and 10 pm. Patient taking differently: Inject 50 Units into the skin 2 (two) times daily at 8 am and 10 pm. 07/13/20   Lyda Jester M, PA-C  Insulin Pen Needle 32G X 4 MM MISC Use to inject insulin with insulin pen 05/17/16   Chipper Herb, MD  LIVALO 4 MG TABS TAKE 1 TABLET BY MOUTH EVERY DAY 09/11/20   Ronnie Doss M, DO  loratadine (CLARITIN) 10 MG tablet Take 10 mg by mouth daily.     [provider]  meclizine (ANTIVERT) 12.5 MG tablet Take 1 tablet (12.5 mg total) by mouth 3 (three) times daily as needed for dizziness. 06/14/19   Larey Dresser, MD  Multiple Vitamins-Minerals (MULTIVITAMIN GUMMIES ADULT PO) Take 2 each by mouth daily.     [provider]  NOVOLIN R RELION 100 UNIT/ML injection Inject 20-30 Units into the skin 2 (two) times daily before a meal. Per sliding scale 02/07/20   [provider]  nutrition supplement, JUVEN, (JUVEN) PACK Take 1  packet by mouth 2 (two) times daily between meals. 04/26/19   Swayze, Ava, DO  pantoprazole (PROTONIX) 40 MG tablet Take 1 tablet (40 mg total) by mouth daily. 05/19/20   Larey Dresser, MD  predniSONE (DELTASONE) 10 MG tablet Take 5 daily for 2 days followed by 4,3,2 and 1 for 2 days each. 09/16/20   Claretta Fraise, MD  RELION INSULIN SYRINGE 31G X 15/64" 1 ML MISC Inject 1 Syringe into the skin 3 (three) times daily. 01/04/20   [provider]  sulfamethoxazole-trimethoprim (BACTRIM DS) 800-160 MG tablet Take 1 tablet by mouth 2 (two) times daily. Until gone, for infection 09/16/20   Claretta Fraise, MD  torsemide (DEMADEX) 20 MG tablet Take 2 tablets (40 mg total) by mouth 2 (two) times daily. 09/03/20   Lyda Jester M, PA-C  triamcinolone (KENALOG) 0.1 % Apply 1 application topically daily. 07/16/20   [provider]  VASCEPA 1 g capsule TAKE 2 CAPSULES (2 G TOTAL) BY MOUTH 2 (TWO) TIMES A DAY. 01/16/20   Larey Dresser, MD  VYNDAMAX 61 MG CAPS TAKE 1 CAPSULE (61 MG) BY MOUTH DAILY. 08/05/20   Larey Dresser, MD  Olmesartan-Amlodipine-HCTZ [facility]) 40-5-12.5 MG TABS Take 1 tablet by mouth daily.    04/12/18  [provider]    Allergies    Amlodipine, Atorvastatin, Crestor [rosuvastatin calcium], Ezetimibe-simvastatin, Sitagliptin, Celebrex [celecoxib], and Levemir [insulin detemir]  Review of Systems   Review of Systems  Respiratory: Positive for shortness of breath.   Cardiovascular: Positive for chest pain.  All other systems reviewed and are negative.   Physical Exam Updated Vital Signs BP Marland Kitchen)  138/57   Pulse 74   Temp 98.1 F (36.7 C) (Oral)   Resp 20   SpO2 94%   Physical Exam Vitals and nursing note reviewed.  Constitutional:      Comments: Tachypneic  HENT:     Head: Normocephalic.  Eyes:     Extraocular Movements: Extraocular movements intact.     Pupils: Pupils are equal, round, and reactive to light.  Cardiovascular:     Rate and  Rhythm: Normal rate and regular rhythm.     Heart sounds: Normal heart sounds.  Pulmonary:     Comments: Tachypneic and crackles in bilateral bases Abdominal:     General: Bowel sounds are normal.     Palpations: Abdomen is soft.  Musculoskeletal:     Cervical back: Normal range of motion and neck supple.     Comments: Left lower leg stage II ulcer that is chronic.  No obvious surrounding cellulitis.  There is 1+ pitting edema bilateral legs  Skin:    General: Skin is warm.     Capillary Refill: Capillary refill takes less than 2 seconds.  Neurological:     General: No focal deficit present.     Mental Status: She is alert and oriented to person, place, and time.  Psychiatric:        Mood and Affect: Mood normal.        Behavior: Behavior normal.     ED Results / Procedures / Treatments   Labs (all labs ordered are listed, but only abnormal results are displayed) Labs Reviewed  BASIC METABOLIC PANEL - Abnormal; Notable for the following components:      Result Value   Sodium 133 (*)    Chloride 96 (*)    CO2 20 (*)    Glucose, Bld 187 (*)    BUN 84 (*)    Creatinine, Ser 4.52 (*)    Calcium 8.5 (*)    GFR, Estimated 10 (*)    Anion gap 17 (*)    All other components within normal limits  CBC - Abnormal; Notable for the following components:   RBC 3.45 (*)    Hemoglobin 8.3 (*)    HCT 26.9 (*)    MCV 78.0 (*)    MCH 24.1 (*)    RDW 19.9 (*)    All other components within normal limits  BRAIN NATRIURETIC PEPTIDE - Abnormal; Notable for the following components:   B Natriuretic Peptide 1,011.7 (*)    All other components within normal limits  TROPONIN I (HIGH SENSITIVITY) - Abnormal; Notable for the following components:   Troponin I (High Sensitivity) 106 (*)    All other components within normal limits  TROPONIN I (HIGH SENSITIVITY) - Abnormal; Notable for the following components:   Troponin I (High Sensitivity) 122 (*)    All other components within normal  limits  SARS CORONAVIRUS 2 BY RT PCR [facility] ORDER, [place] LAB)    EKG EKG Interpretation  Date/Time:  Sunday September 20 2020 18:34:49 EST Ventricular Rate:  77 PR Interval:    QRS Duration: 128 QT Interval:  398 QTC Calculation: 451 R Axis:   68 Text Interpretation: Accelerated junctional rhythm LVH with secondary repolarization abnormality Probable anterior infarct, age indeterminate No significant change since last tracing Confirmed by Wandra Arthurs [phone]) on 10/07/2020 6:54:11 PM   Radiology DG Chest 2 View  Result Date: 10/05/2020 CLINICAL DATA:  Chest pain, cough EXAM: CHEST - 2 VIEW COMPARISON:  07/20/2020  FINDINGS: Frontal and lateral views of the chest demonstrates stable enlargement of the cardiac silhouette. Aortic valve prosthesis again noted. Interval development of left lower lobe consolidation and small left pleural effusion, which could reflect bronchopneumonia. No pneumothorax. No acute bony abnormalities. IMPRESSION: 1. Patchy left lower lobe consolidation and small left pleural effusion, favor bronchopneumonia. Electronically Signed   By: Randa Ngo M.D.   On: 09/27/2020 17:11    Procedures Procedures   Medications Ordered in ED Medications  furosemide (LASIX) injection 80 mg (80 mg Intravenous Given 10/01/2020 1945)    ED Course  I have reviewed the triage vital signs and the nursing notes.  Pertinent labs & imaging results that were available during my care of the patient were reviewed by me and considered in my medical decision making (see chart for details).    MDM Rules/Calculators/A&P                         Leianna P Pagaduan is a 70 y.o. female here presenting with shortness of breath and chest pain.  Patient appears to be clinically in volume overload.  Likely CHF exacerbation versus ACS.  Will get CBC, BMP, BNP, troponin x2, chest x-ray, Covid test.  8:16 PM Initial trop 100 and went up to 120. BNP 1000.  Covid is  negative.  Chest x-ray showed likely mild pulmonary edema.  I talked to cardiology who saw the patient.  Patient also has an AKI.  He thinks that elevated troponin likely from AKI and CHF exacerbation.  Recommend 80mg  IV Lasix twice daily and hospitalist admission  Final Clinical Impression(s) / ED Diagnoses Final diagnoses:  None    Rx / DC Orders ED Discharge Orders    None       Drenda Freeze, MD 10/09/2020 2249

## 2020-09-20 NOTE — H&P (Signed)
History and Physical    [name]:[id number] DOB: 1951/03/16 DOA: 09/23/2020  PCP: [name], DO  Patient coming from: Home  I have personally briefly reviewed patient's old medical records in [place]  Chief Complaint: CP, SOB  HPI: [name] is a 70 y.o. 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 Aundra Dubin.  Pt presents to ED with 2 week history of worsening dyspnea, orthopnea, PND.  Recently using 2 pillows and upright due to orthopnea.  Increased DOE especially in the past week.  Increased peripheral edema in past week.  Having chest tightness for past 24h.  Chest tightness worse with coughing.  Chest tightness lasted several hours.  Seen by cards 1/20: mildly hypervolemic, tried increasing torsemide = limited UOP at home.  Started on prednisone and bactrim on 2/2 for sinusitis it looks like.  No abd pain, some nausea.   ED Course: Trops of 106 and 122, BNP 1011.  Creat 4.5 up from 2.5 on 1/20, HGB 8.3 (stable).  COVID neg.  WBC nl.  No SIRS.  CXR: LLL consolidation and small effusion, favor PNA  Cards saw pt: they favor CHF, 80mg  IV lasix given, see note.   Review of Systems: As per HPI, otherwise all review of systems negative.  Past Medical History:  Diagnosis Date  . Atrial fibrillation ([place])    a. maintaining sinus s/p DCCV, on Eliquis  . CAD (coronary artery disease)    a. Nonobstructive by cath 2006 - 25% LAD, 25-30% after 1st diag, distal 25% PDA, minor irreg LCx. b. Normal nuc 2011.  . Cellulitis and abscess of left leg 07/2020  . CHF (congestive heart failure) ([place])   . CKD (chronic kidney disease), stage III ([place])   . Cystocele   . Difficult intubation 09-07-2012   big neck trouble with intubation parotid surgery"takes little med to sedate"  . Esophageal stricture   . GERD (gastroesophageal reflux disease)   . Hiatal hernia    a.  07/2013: HH with small stricture holding up barium tablet (concides with patient's sx of food sticking).  . History of kidney stones   . Hyperlipidemia   . Hypertension   . IBS (irritable bowel syndrome)   . Memory difficulty 11/26/2015  . Morbid obesity ([place])    a. hx of gastric bypass (sleeve gastrectomy 2015)  . Non-STEMI (non-ST elevated myocardial infarction) ([place]) 01/16/2020  . Osteoporosis   . 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  [place] Kitchen Pericardial effusion    a. Mod by echo 2012 at time of PNA. b. Echo 07/2012: small pericardial effusion vs fat.  . Pressure ulcer 04/16/2019   right foot  . S/P TAVR (transcatheter aortic valve replacement)   . Severe aortic stenosis   . Sleep apnea     "have mask; don't use it" (02/14/2018)  . TIA (transient ischemic attack) 10/2014; 06/2016   "some memory issues since; daughter says my speech is sometimes different" (02/14/2018)  . Type II diabetes mellitus ([place])     Past Surgical History:  Procedure Laterality Date  . BREATH TEK H PYLORI N/A 07/29/2013   Procedure: [place];  Surgeon: Shann Medal, MD;  Location: [facility];  Service: General;  Laterality: N/A;  . CARDIAC CATHETERIZATION  2006  . CARDIOVERSION N/A 02/16/2018   Procedure: CARDIOVERSION;  Surgeon: Pixie Casino, MD;  Location:  [facility];  Service: Cardiovascular;  Laterality: N/A;  . CARDIOVERSION N/A 02/25/2020   Procedure: CARDIOVERSION;  Surgeon: Larey Dresser, MD;  Location: [facility];  Service: Cardiovascular;  Laterality: N/A;  . CARPAL TUNNEL RELEASE Left 2011  . CATARACT EXTRACTION W/ INTRAOCULAR LENS  IMPLANT, BILATERAL Bilateral 2003   left  . CHOLECYSTECTOMY OPEN  1982  . COLONOSCOPY N/A 07/14/2015   Procedure: COLONOSCOPY;  Surgeon: Ladene Artist, MD;  [facility]: [facility];  Service: Endoscopy;  Laterality: N/A;  . ESOPHAGOGASTRODUODENOSCOPY N/A 12/27/2013   Procedure: ESOPHAGOGASTRODUODENOSCOPY (EGD);  Surgeon: Shann Medal, MD;  Location: [facility];  Service: General;  Laterality: N/A;  . ESOPHAGOGASTRODUODENOSCOPY (EGD) WITH ESOPHAGEAL DILATION    . INTRAOPERATIVE TRANSTHORACIC ECHOCARDIOGRAM  05/08/2018   Procedure: INTRAOPERATIVE TRANSTHORACIC ECHOCARDIOGRAM;  Surgeon: Burnell Blanks, MD;  Location: [facility];  Service: Open Heart Surgery;;  . IR FLUORO GUIDE CV LINE RIGHT  04/25/2019  . IR REMOVAL TUN CV CATH W/O FL  06/20/2019  . IR US GUIDE VASC ACCESS RIGHT  04/25/2019  . LAPAROSCOPIC GASTRIC SLEEVE RESECTION N/A 01/20/2014   Procedure: LAPAROSCOPIC GASTRIC SLEEVE RESECTION;  Surgeon: Shann Medal, MD;  [facility];  Service: General;  Laterality: N/A;  . PAROTIDECTOMY  09/07/2012   Procedure: PAROTIDECTOMY;  Surgeon: Melida Quitter, MD;  Location: [facility];  Service: ENT;  Laterality: Left;  LEFT PAROTIDECTOMY  . RIGHT HEART CATH N/A 09/25/2018   Procedure: RIGHT HEART CATH;  Surgeon: Larey Dresser, MD;  Location: [facility];  Service: Cardiovascular;  Laterality: N/A;  . RIGHT HEART CATH N/A 07/06/2020   Procedure: RIGHT HEART CATH;  Surgeon: Larey Dresser, MD;  Location: [facility];  Service: Cardiovascular;  Laterality: N/A;  . RIGHT/LEFT HEART CATH AND CORONARY ANGIOGRAPHY N/A 03/29/2018   Procedure: RIGHT/LEFT HEART CATH AND CORONARY ANGIOGRAPHY;  Surgeon: Burnell Blanks, MD;  Location: [facility];  Service: Cardiovascular;  Laterality: N/A;  . TEE WITHOUT CARDIOVERSION N/A 02/16/2018   Procedure: TRANSESOPHAGEAL ECHOCARDIOGRAM (TEE);  Surgeon: Pixie Casino, MD;  Location: [facility];  Service: Cardiovascular;  Laterality: N/A;  . TEE WITHOUT CARDIOVERSION N/A 06/05/2019   Procedure: TRANSESOPHAGEAL ECHOCARDIOGRAM (TEE);  Surgeon: Sanda Klein, MD;  Location: [facility];  Service: Cardiovascular;  Laterality: N/A;  . TEE WITHOUT CARDIOVERSION N/A 04/24/2019   Procedure: TRANSESOPHAGEAL ECHOCARDIOGRAM (TEE);  Surgeon: Sanda Klein, MD;   Location: [facility];  Service: Cardiovascular;  Laterality: N/A;  . TEE WITHOUT CARDIOVERSION N/A 02/25/2020   Procedure: TRANSESOPHAGEAL ECHOCARDIOGRAM (TEE);  Surgeon: Larey Dresser, MD;  Location[facility];  Service: Cardiovascular;  Laterality: N/A;  . TEE WITHOUT CARDIOVERSION N/A 07/08/2020   Procedure: TRANSESOPHAGEAL ECHOCARDIOGRAM (TEE);  Surgeon: Larey Dresser, MD;  Location: [facility];  Service: Cardiovascular;  Laterality: N/A;  . TOE AMPUTATION Left 07/2017   great toe; [facility]   . TONSILLECTOMY  1968  . TRANSCATHETER AORTIC VALVE REPLACEMENT, TRANSFEMORAL  05/08/2018   Transcatheter Aortic Valve Replacement   . TRANSCATHETER AORTIC VALVE REPLACEMENT, TRANSFEMORAL N/A 05/08/2018   Procedure: TRANSCATHETER AORTIC VALVE REPLACEMENT, TRANSFEMORAL;  Surgeon: Burnell Blanks, MD;  Location: [facility];  Service: Open Heart Surgery;  Laterality: N/A;  . TUBAL LIGATION  yrs ago  . UPPER GI ENDOSCOPY  01/20/2014   Procedure: UPPER GI ENDOSCOPY;  Surgeon: Shann Medal, MD;  [facility];  Service: General;;     reports that she has never smoked. She has never used smokeless tobacco. She  reports previous alcohol use. She reports that she does not use drugs.  Allergies  Allergen Reactions  . Amlodipine Swelling    Ankle swelling  . Atorvastatin Other (See Comments)    Myalgias   . Crestor [Rosuvastatin Calcium] Other (See Comments)    Stiffness and back pain  . Ezetimibe-Simvastatin Other (See Comments)    Leg cramps  . Sitagliptin Other (See Comments)    Unknown reaction  . Celebrex [Celecoxib] Rash  . Levemir [Insulin Detemir] Rash    Family History  Problem Relation Age of Onset  . Heart failure Mother   . Hypertension Mother   . Skin cancer Mother   . Colitis Mother   . Diabetes Mother   . Kidney Stones Mother   . Stroke Mother   . Heart disease Father   . Colon polyps Father   . Diabetes Father   . Heart failure Father   . Heart attack  Sister   . Stroke Maternal Grandfather   . Diabetes Maternal Grandfather   . Diabetes Daughter        prediabetes  . Colon cancer Neg Hx      Prior to Admission medications   Medication Sig Start Date End Date Taking? Authorizing Provider  ACCU-CHEK AVIVA PLUS test strip USE TO CHECK BLOOD SUGAR UP TO TWICE DAILY OR AS INSTRUCTED Patient taking differently: 1 each by Other route 2 (two) times daily. 09/04/17   Chipper Herb, MD  Alirocumab (PRALUENT) 75 MG/ML SOAJ Inject 75 mg into the skin every 14 (fourteen) days. 07/13/20   Larey Dresser, MD  amiodarone (PACERONE) 200 MG tablet Take 200 mg by mouth daily.    [provider]  apixaban (ELIQUIS) 5 MG TABS tablet Take 1 tablet (5 mg total) by mouth 2 (two) times daily. 09/02/20   Minus Breeding, MD  B-D INS SYR ULTRAFINE 1CC/31G 31G X 5/16" 1 ML MISC USE TO INJECT INSULIN TWICE A DAY AS INSTRUCTED Patient taking differently: 1 Stick by Other route 3 (three) times daily before meals. 11/16/15   Chipper Herb, MD  Calcium Carbonate (CALCIUM 600 PO) Take 1 tablet by mouth daily.    [provider]  Cholecalciferol (VITAMIN D3 PO) Take 1 tablet by mouth daily.     [provider]  hydrALAZINE (APRESOLINE) 50 MG tablet Take 50 mg by mouth in the morning and at bedtime.    [provider]  insulin NPH Human (NOVOLIN N) 100 UNIT/ML injection Inject 0.25 mLs (25 Units total) into the skin 2 (two) times daily at 8 am and 10 pm. Patient taking differently: Inject 50 Units into the skin 2 (two) times daily at 8 am and 10 pm. 07/13/20   Lyda Jester M, PA-C  Insulin Pen Needle 32G X 4 MM MISC Use to inject insulin with insulin pen 05/17/16   Chipper Herb, MD  LIVALO 4 MG TABS TAKE 1 TABLET BY MOUTH EVERY DAY 09/11/20   Ronnie Doss M, DO  loratadine (CLARITIN) 10 MG tablet Take 10 mg by mouth daily.     [provider]  meclizine (ANTIVERT) 12.5 MG tablet Take 1 tablet (12.5 mg total) by  mouth 3 (three) times daily as needed for dizziness. 06/14/19   Larey Dresser, MD  Multiple Vitamins-Minerals (MULTIVITAMIN GUMMIES ADULT PO) Take 2 each by mouth daily.     [provider]  NOVOLIN R RELION 100 UNIT/ML injection Inject 20-30 Units into the skin 2 (two) times daily  before a meal. Per sliding scale 02/07/20   [provider]  nutrition supplement, JUVEN, (JUVEN) PACK Take 1 packet by mouth 2 (two) times daily between meals. 04/26/19   Swayze, Ava, DO  pantoprazole (PROTONIX) 40 MG tablet Take 1 tablet (40 mg total) by mouth daily. 05/19/20   Larey Dresser, MD  RELION INSULIN SYRINGE 31G X 15/64" 1 ML MISC Inject 1 Syringe into the skin 3 (three) times daily. 01/04/20   [provider]  torsemide (DEMADEX) 20 MG tablet Take 2 tablets (40 mg total) by mouth 2 (two) times daily. 09/03/20   Lyda Jester M, PA-C  triamcinolone (KENALOG) 0.1 % Apply 1 application topically daily. 07/16/20   [provider]  VASCEPA 1 g capsule TAKE 2 CAPSULES (2 G TOTAL) BY MOUTH 2 (TWO) TIMES A DAY. 01/16/20   Larey Dresser, MD  VYNDAMAX 61 MG CAPS TAKE 1 CAPSULE (61 MG) BY MOUTH DAILY. 08/05/20   Larey Dresser, MD  Olmesartan-Amlodipine-HCTZ [facility]) 40-5-12.5 MG TABS Take 1 tablet by mouth daily.    04/12/18  [provider]    Physical Exam: Vitals:   09/30/2020 1713 10/02/2020 1830 09/23/2020 1915  BP: 127/78 136/65 (!) 138/57  Pulse: 77 77 74  Resp: 18 12 20   Temp: 98.1 F (36.7 C)    TempSrc: Oral    SpO2: 98% 98% 94%    Constitutional: NAD, calm, comfortable Eyes: PERRL, lids and conjunctivae normal ENMT: Mucous membranes are moist. Posterior pharynx clear of any exudate or lesions.Normal dentition.  Neck: normal, supple, no masses, no thyromegaly Respiratory: clear to auscultation bilaterally, no wheezing, no crackles. Normal respiratory effort. No accessory muscle use.  Cardiovascular: Regular rate and rhythm, no murmurs / rubs /  gallops. BLE pitting edema to knees. JVD at mandible. Abdomen: no tenderness, no masses palpated. No hepatosplenomegaly. Bowel sounds positive.  Musculoskeletal: no clubbing / cyanosis. No joint deformity upper and lower extremities. Good ROM, no contractures. Normal muscle tone.  Skin: no rashes, lesions, ulcers. No induration Neurologic: CN 2-12 grossly intact. Sensation intact, DTR normal. Strength 5/5 in all 4.  Psychiatric: Normal judgment and insight. Alert and oriented x 3. Normal mood.    Labs on Admission: I have personally reviewed following labs and imaging studies  CBC: Recent Labs  Lab 10/04/2020 1659  WBC 6.9  HGB 8.3*  HCT 26.9*  MCV 78.0*  PLT 607   Basic Metabolic Panel: Recent Labs  Lab 09/16/2020 1659  NA 133*  K 5.1  CL 96*  CO2 20*  GLUCOSE 187*  BUN 84*  CREATININE 4.52*  CALCIUM 8.5*   GFR: Estimated Creatinine Clearance: 12.9 mL/min (A) (by C-G formula based on SCr of 4.52 mg/dL (H)). Liver Function Tests: No results for input(s): AST, ALT, ALKPHOS, BILITOT, PROT, ALBUMIN in the last 168 hours. No results for input(s): LIPASE, AMYLASE in the last 168 hours. No results for input(s): AMMONIA in the last 168 hours. Coagulation Profile: No results for input(s): INR, PROTIME in the last 168 hours. Cardiac Enzymes: No results for input(s): CKTOTAL, CKMB, CKMBINDEX, TROPONINI in the last 168 hours. BNP (last 3 results) No results for input(s): PROBNP in the last 8760 hours. HbA1C: No results for input(s): HGBA1C in the last 72 hours. CBG: No results for input(s): GLUCAP in the last 168 hours. Lipid Profile: No results for input(s): CHOL, HDL, LDLCALC, TRIG, CHOLHDL, LDLDIRECT in the last 72 hours. Thyroid Function Tests: No results for input(s): TSH, T4TOTAL, FREET4, T3FREE, THYROIDAB in  the last 72 hours. Anemia Panel: No results for input(s): VITAMINB12, FOLATE, FERRITIN, TIBC, IRON, RETICCTPCT in the last 72 hours. Urine analysis:    Component  Value Date/Time   COLORURINE YELLOW 02/19/2020 1822   APPEARANCEUR HAZY (A) 02/19/2020 1822   APPEARANCEUR Clear 06/19/2018 1637   LABSPEC 1.011 02/19/2020 1822   PHURINE 5.0 02/19/2020 1822   GLUCOSEU >=500 (A) 02/19/2020 1822   HGBUR SMALL (A) 02/19/2020 1822   BILIRUBINUR NEGATIVE 02/19/2020 1822   BILIRUBINUR Negative 06/19/2018 [place] 02/19/2020 1822   PROTEINUR NEGATIVE 02/19/2020 1822   UROBILINOGEN 1.0 11/07/2014 2242   NITRITE POSITIVE (A) 02/19/2020 1822   LEUKOCYTESUR MODERATE (A) 02/19/2020 1822    Radiological Exams on Admission: DG Chest 2 View  Result Date: 10/12/2020 CLINICAL DATA:  Chest pain, cough EXAM: CHEST - 2 VIEW COMPARISON:  07/20/2020 FINDINGS: Frontal and lateral views of the chest demonstrates stable enlargement of the cardiac silhouette. Aortic valve prosthesis again noted. Interval development of left lower lobe consolidation and small left pleural effusion, which could reflect bronchopneumonia. No pneumothorax. No acute bony abnormalities. IMPRESSION: 1. Patchy left lower lobe consolidation and small left pleural effusion, favor bronchopneumonia. Electronically Signed   By: Randa Ngo M.D.   On: 10/02/2020 17:11    EKG: Independently reviewed.  Assessment/Plan Principal Problem:   Acute on chronic diastolic CHF (congestive heart failure) (HCC) Active Problems:   Type II diabetes mellitus with renal manifestations (HCC)   CKD (chronic kidney disease) stage 4, GFR 15-29 ml/min (HCC)   Essential hypertension   AKI (acute kidney injury) (HCC)   Anemia   Sinusitis    1. Acute on chronic diastolic CHF - 1. See cards note: 2. Lasix 80mg  IV x1 in ED 1. Call cards if poor UOP 3. 2d echo in AM 4. Cont Tafamidis 5. Strict intake and output 6. Daily BMP 7. Tele monitor 8. Mg+ pending 9. K is 5.1 2. ? PNA - 1. Cards more suspicious of CHF 2. Indeed pt has no SIRS and multiple CHF findings including: elevated BNP, JVD,  peripheral edema, orthopnea, etc. 3. Checking Procalcitonin - if positive would start CAP coverage 4. COVID is neg 3. AKI on CKD 4 - 1. BUN also elevated = true AKI, not just creat elevation due to Bactrim. 2. Cardiorenal? 3. Trying diuresis with lasix as above 4. Strict intake and output 5. Repeat BMP in AM 6. Likely needs nephro consult in AM 7. CPK pending 4. Anemia - 1. Cont po iron 2. Checking anemia pnl, ? Anemia of CKD 5. PAF - 1. Cont amiodarone 2. Cont eliquis 6. DM2 - 1. NPH 35u QAM (takes 50u QAM at home, none in evening) 2. Mod scale SSI 7. HTN - 1. BP on soft side in ED after lasix 2. Hold hydralazine 8. ?Sinusitis - 1. Put on bactrim and prednisone on 2/2 2. Stop bactrim due to AKI 3. Probably Stop prednisone due to fluid retention / CHF 1. Only on 4 days of this med now, adrenal insufficiency due to sudden stoppage is unlikely.  DVT prophylaxis: Eliquis Code Status: Full code Family Communication: No family in room Disposition Plan: Home after renal recovery and CHF improvement Consults called: Dr. Philipp Ovens - see consult note Admission status: Admit to inpatient  Severity of Illness: The appropriate patient status for this patient is INPATIENT. Inpatient status is judged to be reasonable and necessary in order to provide the required intensity of service to ensure the patient's safety. The  patient's presenting symptoms, physical exam findings, and initial radiographic and laboratory data in the context of their chronic comorbidities is felt to place them at high risk for further clinical deterioration. Furthermore, it is not anticipated that the patient will be medically stable for discharge from the hospital within 2 midnights of admission. The following factors support the patient status of inpatient.   IP status due to: 1) complicated CHF with 2) AKI with creat of 4.5 up from 2.5 baseline.  * I certify that at the point of admission it is my clinical  judgment that the patient will require inpatient hospital care spanning beyond 2 midnights from the point of admission due to high intensity of service, high risk for further deterioration and high frequency of surveillance required.*    GARDNER, JARED M. DO [facility]  How to contact the [facility] Attending or Consulting provider [place] or covering provider during after hours [place], for this patient?  1. Check the care team in [facility] and look for a) attending/consulting [facility] provider listed and b) the [facility] team listed 2. Log into [email or web address]  Amion Physician Scheduling and messaging for groups and whole hospitals  On call and physician scheduling software for group practices, residents, hospitalists and other medical providers for call, clinic, rotation and shift schedules. On[employer] is a hospital-wide system for scheduling doctors and paging doctors on call. EasyPlot is for scientific plotting and data analysis.  [email or web address]  and use [place]'s universal password to access. If you do not have the password, please contact the hospital operator.  3. Locate the [facility] provider you are looking for under [facility] and page to a number that you can be directly reached. 4. If you still have difficulty reaching the provider, please page the [facility] (Director on Call) for the Hospitalists listed on amion for assistance.  10/02/2020, 9:13 PM

## 2020-09-20 NOTE — ED Notes (Signed)
Admitting MD at bedside speaking with patient and daughter

## 2020-09-20 NOTE — Consult Note (Signed)
Cardiology Consultation:   Patient ID: [name] MRN: [id number]; DOB: 1951-01-22  Admit date: 09/26/2020 Date of Consult: 09/23/2020  Primary Care Provider: [name], DO Primary Cardiologist: Minus Breeding, MD  Primary Electrophysiologist:  Thompson Grayer, MD   Reason for consult: Further recommendations for management of recurrent HFpEF  History of Present Illness:   [name] is a 70 y.o. 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 Darl Householder.  Patient reports that she has been having issues with worsening dyspnea for the past 2 weeks her son.  She describes orthopnea and PND.  She will oftentimes sleep with the degree of elevation, and recently has been using 2 pillows and slightly plantar because of orthopnea.  This is also baseline has very limited mobility, but she says of the past week and the movements will make her very short of breath.  In addition to this the patient thinks that she might have a bit more leg swelling than normal.  She notes for the past 24 hours she has had periodic chest discomforts that she describes as tightness.  This tightness is nonexertional and exacerbated by coughing.  The chest tightness lasted several hours and seems to be better now.  Onset of chest tightness was unrelated to exertion.  Patient is unclear what might have set off her symptoms, though she states that some weeks ago she saw a provider who started her on prednisone for reasons she is unclear about.  She also states that she was taking Keflex for possible cellulitis of her leg.  Of note, [name] has had multiple admissions for heart failure with preserved ejection fraction exacerbation.  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  He is  taking Tafamidis.  The patient was last seen by cardiology 1/20.  She was noted to be mildly hypervolemic at this visit, and her torsemide was increased to 40 twice daily.  Unfortunately, the patient states that with increasing her torsemide she had limited urine output.  Emergency department the patient is normotensive, she is mildly dyspneic with nasal cannula in place.  She is in sinus rhythm with heart rates in the 80s.   Heart Pathway Score:     Past Medical History:  Diagnosis Date  . Atrial fibrillation ([place])    a. maintaining sinus s/p DCCV, on Eliquis  . CAD (coronary artery disease)    a. Nonobstructive by cath 2006 - 25% LAD, 25-30% after 1st diag, distal 25% PDA, minor irreg LCx. b. Normal nuc 2011.  . Cellulitis and abscess of left leg 07/2020  . CHF (congestive heart failure) ([place])   . CKD (chronic kidney disease), stage III ([place])   . Cystocele   . Difficult intubation 09-07-2012   big neck trouble with intubation parotid surgery"takes little med to sedate"  . Esophageal stricture   . GERD (gastroesophageal reflux disease)   . Hiatal hernia    a. 07/2013: HH with small stricture holding up barium tablet (concides with patient's sx of food sticking).  . History of kidney stones   . Hyperlipidemia   . Hypertension   . IBS (irritable bowel syndrome)   . Memory difficulty 11/26/2015  . Morbid obesity ([place])    a. hx of gastric bypass (sleeve gastrectomy 2015)  .  Non-STEMI (non-ST elevated myocardial infarction) ([place]) 01/16/2020  . Osteoporosis   . 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  [facility] Pericardial effusion    a. Mod by echo 2012 at time of PNA. b. Echo 07/2012: small pericardial effusion vs fat.  . Pressure ulcer 04/16/2019   right foot  . S/P TAVR (transcatheter aortic valve replacement)   . Severe aortic stenosis   . Sleep apnea     "have mask; don't use it" (02/14/2018)  . TIA (transient ischemic attack) 10/2014; 06/2016   "some memory issues  since; daughter says my speech is sometimes different" (02/14/2018)  . Type II diabetes mellitus ([place])     Past Surgical History:  Procedure Laterality Date  . BREATH TEK H PYLORI N/A 07/29/2013   Procedure: [place];  Surgeon: Shann Medal, MD;  Location: [facility];  Service: General;  Laterality: N/A;  . CARDIAC CATHETERIZATION  2006  . CARDIOVERSION N/A 02/16/2018   Procedure: CARDIOVERSION;  Surgeon: Pixie Casino, MD;  Location: [facility];  Service: Cardiovascular;  Laterality: N/A;  . CARDIOVERSION N/A 02/25/2020   Procedure: CARDIOVERSION;  Surgeon: Larey Dresser, MD;  Location: [place];  Service: Cardiovascular;  Laterality: N/A;  . CARPAL TUNNEL RELEASE Left 2011  . CATARACT EXTRACTION W/ INTRAOCULAR LENS  IMPLANT, BILATERAL Bilateral 2003   left  . CHOLECYSTECTOMY OPEN  1982  . COLONOSCOPY N/A 07/14/2015   Procedure: COLONOSCOPY;  Surgeon: Ladene Artist, MD;  Location: [facility];  Service: Endoscopy;  Laterality: N/A;  . ESOPHAGOGASTRODUODENOSCOPY N/A 12/27/2013   Procedure: ESOPHAGOGASTRODUODENOSCOPY (EGD);  Surgeon: Shann Medal, MD;  Location: [facility];  Service: General;  Laterality: N/A;  . ESOPHAGOGASTRODUODENOSCOPY (EGD) WITH ESOPHAGEAL DILATION    . INTRAOPERATIVE TRANSTHORACIC ECHOCARDIOGRAM  05/08/2018   Procedure: INTRAOPERATIVE TRANSTHORACIC ECHOCARDIOGRAM;  Surgeon: Burnell Blanks, MD;  Location: [place];  Service: Open Heart Surgery;;  . IR FLUORO GUIDE CV LINE RIGHT  04/25/2019  . IR REMOVAL TUN CV CATH W/O FL  06/20/2019  . IR US GUIDE VASC ACCESS RIGHT  04/25/2019  . LAPAROSCOPIC GASTRIC SLEEVE RESECTION N/A 01/20/2014   Procedure: LAPAROSCOPIC GASTRIC SLEEVE RESECTION;  Surgeon: Shann Medal, MD;  Location: [facility];  Service: General;  Laterality: N/A;  . PAROTIDECTOMY  09/07/2012   Procedure: PAROTIDECTOMY;  Surgeon: Melida Quitter, MD;  Location: [place];  Service: ENT;  Laterality: Left;  LEFT PAROTIDECTOMY  . RIGHT HEART  CATH N/A 09/25/2018   Procedure: RIGHT HEART CATH;  Surgeon: Larey Dresser, MD;  Location: [place] CV LAB;  Service: Cardiovascular;  Laterality: N/A;  . RIGHT HEART CATH N/A 07/06/2020   Procedure: RIGHT HEART CATH;  Surgeon: Larey Dresser, MD;  Location: [place] CV LAB;  Service: Cardiovascular;  Laterality: N/A;  . RIGHT/LEFT HEART CATH AND CORONARY ANGIOGRAPHY N/A 03/29/2018   Procedure: RIGHT/LEFT HEART CATH AND CORONARY ANGIOGRAPHY;  Surgeon: Burnell Blanks, MD;  Location: [place] CV LAB;  Service: Cardiovascular;  Laterality: N/A;  . TEE WITHOUT CARDIOVERSION N/A 02/16/2018   Procedure: TRANSESOPHAGEAL ECHOCARDIOGRAM (TEE);  Surgeon: Pixie Casino, MD;  Location: [place];  Service: Cardiovascular;  Laterality: N/A;  . TEE WITHOUT CARDIOVERSION N/A 06/05/2019   Procedure: TRANSESOPHAGEAL ECHOCARDIOGRAM (TEE);  Surgeon: Sanda Klein, MD;  Location: [place];  Service: Cardiovascular;  Laterality: N/A;  . TEE WITHOUT CARDIOVERSION N/A 04/24/2019   Procedure: TRANSESOPHAGEAL ECHOCARDIOGRAM (TEE);  Surgeon: Sanda Klein, MD;  Location: [place];  Service:  Cardiovascular;  Laterality: N/A;  . TEE WITHOUT CARDIOVERSION N/A 02/25/2020   Procedure: TRANSESOPHAGEAL ECHOCARDIOGRAM (TEE);  Surgeon: Larey Dresser, MD;  Location: [facility] ENDOSCOPY;  Service: Cardiovascular;  Laterality: N/A;  . TEE WITHOUT CARDIOVERSION N/A 07/08/2020   Procedure: TRANSESOPHAGEAL ECHOCARDIOGRAM (TEE);  Surgeon: Larey Dresser, MD;  Location: [facility] ENDOSCOPY;  Service: Cardiovascular;  Laterality: N/A;  . TOE AMPUTATION Left 07/2017   great toe; [facility]   . TONSILLECTOMY  1968  . TRANSCATHETER AORTIC VALVE REPLACEMENT, TRANSFEMORAL  05/08/2018   Transcatheter Aortic Valve Replacement   . TRANSCATHETER AORTIC VALVE REPLACEMENT, TRANSFEMORAL N/A 05/08/2018   Procedure: TRANSCATHETER AORTIC VALVE REPLACEMENT, TRANSFEMORAL;  Surgeon: Burnell Blanks, MD;  Location: [place];   Service: Open Heart Surgery;  Laterality: N/A;  . TUBAL LIGATION  yrs ago  . UPPER GI ENDOSCOPY  01/20/2014   Procedure: UPPER GI ENDOSCOPY;  Surgeon: Shann Medal, MD;  Location: [facility];  Service: General;;     Home Medications:  Prior to Admission medications   Medication Sig Start Date End Date Taking? Authorizing Provider  ACCU-CHEK AVIVA PLUS test strip USE TO CHECK BLOOD SUGAR UP TO TWICE DAILY OR AS INSTRUCTED Patient taking differently: 1 each by Other route 2 (two) times daily. 09/04/17   Chipper Herb, MD  Alirocumab (PRALUENT) 75 MG/ML SOAJ Inject 75 mg into the skin every 14 (fourteen) days. 07/13/20   Larey Dresser, MD  amiodarone (PACERONE) 200 MG tablet Take 200 mg by mouth daily.    [provider]  apixaban (ELIQUIS) 5 MG TABS tablet Take 1 tablet (5 mg total) by mouth 2 (two) times daily. 09/02/20   Minus Breeding, MD  B-D INS SYR ULTRAFINE 1CC/31G 31G X 5/16" 1 ML MISC USE TO INJECT INSULIN TWICE A DAY AS INSTRUCTED Patient taking differently: 1 Stick by Other route 3 (three) times daily before meals. 11/16/15   Chipper Herb, MD  Calcium Carbonate (CALCIUM 600 PO) Take 1 tablet by mouth daily.    [provider]  Cholecalciferol (VITAMIN D3 PO) Take 1 tablet by mouth daily.     [provider]  hydrALAZINE (APRESOLINE) 50 MG tablet Take 50 mg by mouth in the morning and at bedtime.    [provider]  insulin NPH Human (NOVOLIN N) 100 UNIT/ML injection Inject 0.25 mLs (25 Units total) into the skin 2 (two) times daily at 8 am and 10 pm. Patient taking differently: Inject 50 Units into the skin 2 (two) times daily at 8 am and 10 pm. 07/13/20   Lyda Jester M, PA-C  Insulin Pen Needle 32G X 4 MM MISC Use to inject insulin with insulin pen 05/17/16   Chipper Herb, MD  LIVALO 4 MG TABS TAKE 1 TABLET BY MOUTH EVERY DAY 09/11/20   Ronnie Doss M, DO  loratadine (CLARITIN) 10 MG tablet Take 10 mg by mouth daily.     [provider]  meclizine (ANTIVERT) 12.5 MG tablet Take 1 tablet (12.5 mg total) by mouth 3 (three) times daily as needed for dizziness. 06/14/19   Larey Dresser, MD  Multiple Vitamins-Minerals (MULTIVITAMIN GUMMIES ADULT PO) Take 2 each by mouth daily.     [provider]  NOVOLIN R RELION 100 UNIT/ML injection Inject 20-30 Units into the skin 2 (two) times daily before a meal. Per sliding scale 02/07/20   [provider]  nutrition supplement, JUVEN, (JUVEN) PACK Take 1 packet by mouth 2 (two) times daily between  meals. 04/26/19   Swayze, Ava, DO  pantoprazole (PROTONIX) 40 MG tablet Take 1 tablet (40 mg total) by mouth daily. 05/19/20   Larey Dresser, MD  predniSONE (DELTASONE) 10 MG tablet Take 5 daily for 2 days followed by 4,3,2 and 1 for 2 days each. 09/16/20   Claretta Fraise, MD  RELION INSULIN SYRINGE 31G X 15/64" 1 ML MISC Inject 1 Syringe into the skin 3 (three) times daily. 01/04/20   [provider]  sulfamethoxazole-trimethoprim (BACTRIM DS) 800-160 MG tablet Take 1 tablet by mouth 2 (two) times daily. Until gone, for infection 09/16/20   Claretta Fraise, MD  torsemide (DEMADEX) 20 MG tablet Take 2 tablets (40 mg total) by mouth 2 (two) times daily. 09/03/20   Lyda Jester M, PA-C  triamcinolone (KENALOG) 0.1 % Apply 1 application topically daily. 07/16/20   [provider]  VASCEPA 1 g capsule TAKE 2 CAPSULES (2 G TOTAL) BY MOUTH 2 (TWO) TIMES A DAY. 01/16/20   Larey Dresser, MD  VYNDAMAX 61 MG CAPS TAKE 1 CAPSULE (61 MG) BY MOUTH DAILY. 08/05/20   Larey Dresser, MD  Olmesartan-Amlodipine-HCTZ [facility]) 40-5-12.5 MG TABS Take 1 tablet by mouth daily.    04/12/18  [provider]    Inpatient Medications: Scheduled Meds:  Continuous Infusions:  PRN Meds:   Allergies:    Allergies  Allergen Reactions  . Amlodipine Swelling    Ankle swelling  . Atorvastatin Other (See Comments)    Myalgias   . Crestor [Rosuvastatin  Calcium] Other (See Comments)    Stiffness and back pain  . Ezetimibe-Simvastatin Other (See Comments)    Leg cramps  . Sitagliptin Other (See Comments)    Unknown reaction  . Celebrex [Celecoxib] Rash  . Levemir [Insulin Detemir] Rash    Social History:   Social History   Socioeconomic History  . Marital status: Married    Spouse name: Not on file  . Number of children: 3  . Years of education: some college  . Highest education level: Some college, no degree  Occupational History  . Occupation: retired    Fish farm manager: [employer]  Tobacco Use  . Smoking status: Never Smoker  . Smokeless tobacco: Never Used  Vaping Use  . Vaping Use: Never used  Substance and Sexual Activity  . Alcohol use: Not Currently    Alcohol/week: 0.0 standard drinks    Comment:  "mixed drink q few years"  . Drug use: Never  . Sexual activity: Yes    Birth control/protection: Post-menopausal  Other Topics Concern  . Not on file  Social History Narrative   Married   Patient is right handed.   Patient rarely drinks caffeine.   Lives at home with husband in two story home. Husband washes laundry in the basement. They have 3 daughters and 7 grandchildren.    Social Determinants of Health   Financial Resource Strain: Low Risk   . Difficulty of Paying Living Expenses: Not hard at all  Food Insecurity: Not on file  Transportation Needs: No Transportation Needs  . Lack of Transportation (Medical): No  . Lack of Transportation (Non-Medical): No  Physical Activity: Not on file  Stress: Not on file  Social Connections: Not on file  Intimate Partner Violence: Not on file    Family History:    Family History  Problem Relation Age of Onset  . Heart failure Mother   . Hypertension Mother   . Skin cancer Mother   . Colitis Mother   .  Diabetes Mother   . Kidney Stones Mother   . Stroke Mother   . Heart disease Father   . Colon polyps Father   . Diabetes Father   . Heart failure Father   .  Heart attack Sister   . Stroke Maternal Grandfather   . Diabetes Maternal Grandfather   . Diabetes Daughter        prediabetes  . Colon cancer Neg Hx      ROS:  Please see the history of present illness.   All other ROS reviewed and negative.     Physical Exam/Data:   Vitals:   09/15/2020 1713 10/11/2020 1830 10/05/2020 1915  BP: 127/78 136/65 (!) 138/57  Pulse: 77 77 74  Resp: 18 12 20   Temp: 98.1 F (36.7 C)    TempSrc: Oral    SpO2: 98% 98% 94%   No intake or output data in the 24 hours ending 10/11/2020 2013 Last 3 Weights 09/16/2020 09/03/2020 07/22/2020  Weight (lbs) 186 lb 186 lb 12.8 oz 187 lb 6.3 oz  Weight (kg) 84.369 kg 84.732 kg 85 kg     There is no height or weight on file to calculate BMI.  General:  Well nourished, well developed, in no acute distress.  Obese HEENT: Large neck circumference Lymph: no adenopathy Neck: JVD ot the mandible at 70 degrees Endocrine:  No thryomegaly noted  Cardiac: Regular rate and rhythm, equal bilateral radial pulses Lungs: Mildly tachypneic, nasal cannula in place. Abd: soft, nontender, no hepatomegaly  Ext: Bilateral pitting edema to the knees Musculoskeletal:  No deformities, multiple digit amputation of the feet. Skin: warm and dry  Neuro:  CNs 2-12 intact, no focal abnormalities noted Psych:  Normal affect   EKG:  The EKG was personally reviewed and demonstrates:   Sinus rhythm, left bundle branch block with QRS duration 120 ms, LVH with secondary repolarization abnormalities, overall limited to tracing likely secondary to infiltrative cardiomyopathy and body habitus.  Telemetry:  Telemetry was personally reviewed and demonstrates: sinus rhythm  Relevant CV Studies: RIGHT HEART CATH 07/06/20    Right Heart Pressures RHC Procedural Findings: Hemodynamics (mmHg) RA mean 17 RV 53/18 PA 56/25, mean 36 PCWP mean 27  Oxygen saturations: PA 48% AO 96%  Cardiac Output (Fick) 6.43  Cardiac Index (Fick) 3.22  PVR 1.4 WU    07/07/20 TTE: 1. Left ventricular ejection fraction, by estimation, is 60 to 65%. The  left ventricle has normal function. The left ventricle has no regional  wall motion abnormalities. There is mild concentric left ventricular  hypertrophy. Diastolic function could not  be assesed due to absence of atrial contraction (junctional rhythm?).  2. Right ventricular systolic function is normal. The right ventricular  size is normal. There is severely elevated pulmonary artery systolic  pressure. The estimated right ventricular systolic pressure is 41.3 mmHg.  3. Left atrial size was mildly dilated.  4. Moderate pericardial effusion. The pericardial effusion is  circumferential. There is no evidence of cardiac tamponade.  5. The mitral valve is degenerative. Mild mitral valve regurgitation.  Mild mitral stenosis. The mean mitral valve gradient is 5.0 mmHg. Moderate  mitral annular calcification.  6. Gradients across the TAVR prosthesis have increased and dimensionless  index has worsened compared to 12/10/2019. The aortic valve has been  repaired/replaced. Aortic valve regurgitation is not visualized. Aortic  valve mean gradient measures 23.0 mmHg.  Aortic valve Vmax measures 3.03 m/s.  7. The inferior vena cava is dilated in size with <50%  respiratory  variability, suggesting right atrial pressure of 15 mmHg.   Comparison(s): TAVR gradients have increased and this is not related to  increased cardiac output. Consider TEE if clinically indicated, for  example if prosthetic leaflet thrombosis is suspected.   Laboratory Data:  High Sensitivity Troponin:   Recent Labs  Lab 10/09/2020 1659 09/30/2020 1843  TROPONINIHS 106* 122*     Chemistry Recent Labs  Lab 10/05/2020 1659  NA 133*  K 5.1  CL 96*  CO2 20*  GLUCOSE 187*  BUN 84*  CREATININE 4.52*  CALCIUM 8.5*  GFRNONAA 10*  ANIONGAP 17*    No results for input(s): PROT, ALBUMIN, AST, ALT, ALKPHOS, BILITOT in the last  168 hours. Hematology Recent Labs  Lab 10/11/2020 1659  WBC 6.9  RBC 3.45*  HGB 8.3*  HCT 26.9*  MCV 78.0*  MCH 24.1*  MCHC 30.9  RDW 19.9*  PLT 346   BNP Recent Labs  Lab 09/18/2020 1843  BNP 1,011.7*    DDimer No results for input(s): DDIMER in the last 168 hours.   Radiology/Studies:  DG Chest 2 View  Result Date: 10/06/2020 CLINICAL DATA:  Chest pain, cough EXAM: CHEST - 2 VIEW COMPARISON:  07/20/2020 FINDINGS: Frontal and lateral views of the chest demonstrates stable enlargement of the cardiac silhouette. Aortic valve prosthesis again noted. Interval development of left lower lobe consolidation and small left pleural effusion, which could reflect bronchopneumonia. No pneumothorax. No acute bony abnormalities. IMPRESSION: 1. Patchy left lower lobe consolidation and small left pleural effusion, favor bronchopneumonia. Electronically Signed   By: Randa Ngo M.D.   On: 09/18/2020 17:11  I personally reviewed the chest x-ray images.  Findings also consistent with pulmonary edema {   Assessment and Plan:   Acute on chronic HFpEF in setting of TTR cardiac amyloid, active: C/b Type II cardiorenal syndrome, active: Patient presents with symptoms classic for hypokalemia and heart failure.  Lab findings including hyponatremia, elevated BNP, AKI; examination; chest radiography supports diagnosis.  I suspect the treatment of patient's decompensation is unresponsiveness at home diuretic escalation in the setting of her renal insufficiency.  Consideration also given to prednisone dosing, possible prior scan soft tissue infection, possible valvular dysfunction.  Patient without evidence of ACS.  -Recommend 80 mg IV Lasix now with repeat dosing for goal net -3 L by tomorrow morning.  If she has not had any consequential response to this dose by the morning, we will reassess for this patient with diuretics with hypotension use metolazone, and necessity to engage nephrology in the  morning. -Strict I's and O's with periodic management system given patient's immobility. -Trend basic metabolic panel repletion of potassium and magnesium. -Maintain cardiac telemetry. -Home Tafamidis. -Medicine. - routine full TTE in the AM with attention to TAVR to assess gradient and for perivalvular leak.  Elevated troponin secondary to acute myocardial injury: Patient is without symptoms concerning for angina.  EKG without dynamic changes to suggest ischemia.  Patient with plateaued low level troponin elevation in the setting of decompensated heart failure and acute kidney injury.  Troponin elevation is most consistent with myocardial injury and not myocardial infarction. -Would recommend against treating his ACS. -TTE as above. -Troponin delta less than 20.  Can stop trending troponin.  Paroxysmal atrial fibrillation, chronic: Patient in sinus rhythm on presentation.  With prior hospitalization in November had an episode of RVR. -Continue home amiodarone. -Continue home Eliquis. -Management of HFpEF as described above. -Nocturnal CPAP for management of OSA.  For questions or updates, please contact [place] Please consult [email or web address] for contact info under     Signed, Doran Clay, MD  10/07/2020 8:13 PM

## 2020-09-21 ENCOUNTER — Inpatient Hospital Stay (HOSPITAL_COMMUNITY): Payer: Medicare Other

## 2020-09-21 DIAGNOSIS — E1169 Type 2 diabetes mellitus with other specified complication: Secondary | ICD-10-CM

## 2020-09-21 DIAGNOSIS — I480 Paroxysmal atrial fibrillation: Secondary | ICD-10-CM | POA: Diagnosis not present

## 2020-09-21 DIAGNOSIS — E1159 Type 2 diabetes mellitus with other circulatory complications: Secondary | ICD-10-CM | POA: Diagnosis not present

## 2020-09-21 DIAGNOSIS — E11622 Type 2 diabetes mellitus with other skin ulcer: Secondary | ICD-10-CM | POA: Diagnosis not present

## 2020-09-21 DIAGNOSIS — E669 Obesity, unspecified: Secondary | ICD-10-CM | POA: Diagnosis not present

## 2020-09-21 DIAGNOSIS — L97909 Non-pressure chronic ulcer of unspecified part of unspecified lower leg with unspecified severity: Secondary | ICD-10-CM | POA: Diagnosis not present

## 2020-09-21 DIAGNOSIS — I10 Essential (primary) hypertension: Secondary | ICD-10-CM | POA: Diagnosis not present

## 2020-09-21 DIAGNOSIS — I5033 Acute on chronic diastolic (congestive) heart failure: Secondary | ICD-10-CM | POA: Diagnosis not present

## 2020-09-21 DIAGNOSIS — E785 Hyperlipidemia, unspecified: Secondary | ICD-10-CM

## 2020-09-21 DIAGNOSIS — N184 Chronic kidney disease, stage 4 (severe): Secondary | ICD-10-CM | POA: Diagnosis not present

## 2020-09-21 DIAGNOSIS — N183 Chronic kidney disease, stage 3 unspecified: Secondary | ICD-10-CM

## 2020-09-21 DIAGNOSIS — I152 Hypertension secondary to endocrine disorders: Secondary | ICD-10-CM

## 2020-09-21 DIAGNOSIS — N179 Acute kidney failure, unspecified: Secondary | ICD-10-CM | POA: Diagnosis not present

## 2020-09-21 DIAGNOSIS — I350 Nonrheumatic aortic (valve) stenosis: Secondary | ICD-10-CM

## 2020-09-21 LAB — IRON AND TIBC
Iron: 23 ug/dL — ABNORMAL LOW (ref 28–170)
Saturation Ratios: 6 % — ABNORMAL LOW (ref 10.4–31.8)
TIBC: 391 ug/dL (ref 250–450)
UIBC: 368 ug/dL

## 2020-09-21 LAB — ECHOCARDIOGRAM COMPLETE
AR max vel: 1.2 cm2
AV Area VTI: 1.12 cm2
AV Area mean vel: 1.08 cm2
AV Mean grad: 22 mmHg
AV Peak grad: 36.7 mmHg
Ao pk vel: 3.03 m/s
Area-P 1/2: 1.99 cm2
MV VTI: 1.13 cm2
S' Lateral: 2.2 cm

## 2020-09-21 LAB — BASIC METABOLIC PANEL
Anion gap: 15 (ref 5–15)
BUN: 93 mg/dL — ABNORMAL HIGH (ref 8–23)
CO2: 23 mmol/L (ref 22–32)
Calcium: 8.2 mg/dL — ABNORMAL LOW (ref 8.9–10.3)
Chloride: 95 mmol/L — ABNORMAL LOW (ref 98–111)
Creatinine, Ser: 4.75 mg/dL — ABNORMAL HIGH (ref 0.44–1.00)
GFR, Estimated: 9 mL/min — ABNORMAL LOW (ref >60)
Glucose, Bld: 229 mg/dL — ABNORMAL HIGH (ref 70–99)
Potassium: 5 mmol/L (ref 3.5–5.1)
Sodium: 133 mmol/L — ABNORMAL LOW (ref 135–145)

## 2020-09-21 LAB — MAGNESIUM: Magnesium: 2.4 mg/dL (ref 1.7–2.4)

## 2020-09-21 LAB — FERRITIN: Ferritin: 40 ng/mL (ref 11–307)

## 2020-09-21 LAB — CBG MONITORING, ED
Glucose-Capillary: 224 mg/dL — ABNORMAL HIGH (ref 70–99)
Glucose-Capillary: 251 mg/dL — ABNORMAL HIGH (ref 70–99)
Glucose-Capillary: 314 mg/dL — ABNORMAL HIGH (ref 70–99)
Glucose-Capillary: 327 mg/dL — ABNORMAL HIGH (ref 70–99)

## 2020-09-21 LAB — VITAMIN B12: Vitamin B-12: 714 pg/mL (ref 180–914)

## 2020-09-21 LAB — LACTIC ACID, PLASMA
Lactic Acid, Venous: 2.2 mmol/L (ref 0.5–1.9)
Lactic Acid, Venous: 3.1 mmol/L (ref 0.5–1.9)

## 2020-09-21 LAB — CK: Total CK: 48 U/L (ref 38–234)

## 2020-09-21 LAB — FOLATE: Folate: 11.9 ng/mL (ref >5.9)

## 2020-09-21 LAB — PROCALCITONIN: Procalcitonin: 0.37 ng/mL

## 2020-09-21 LAB — HEMOGLOBIN A1C
Hgb A1c MFr Bld: 8 % — ABNORMAL HIGH (ref 4.8–5.6)
Mean Plasma Glucose: 182.9 mg/dL

## 2020-09-21 MED ORDER — FUROSEMIDE 10 MG/ML IJ SOLN
80.0000 mg | Freq: Two times a day (BID) | INTRAMUSCULAR | Status: DC
  Administered 2020-09-21 – 2020-09-22 (×3): 80 mg via INTRAVENOUS
  Filled 2020-09-21 (×3): qty 8

## 2020-09-21 MED ORDER — SODIUM CHLORIDE 0.9 % IV SOLN
510.0000 mg | Freq: Once | INTRAVENOUS | Status: AC
  Administered 2020-09-21: 510 mg via INTRAVENOUS
  Filled 2020-09-21: qty 17

## 2020-09-21 MED ORDER — INSULIN ASPART 100 UNIT/ML ~~LOC~~ SOLN
4.0000 [IU] | Freq: Three times a day (TID) | SUBCUTANEOUS | Status: DC
  Administered 2020-09-22: 4 [IU] via SUBCUTANEOUS

## 2020-09-21 MED ORDER — SODIUM CHLORIDE 0.9% FLUSH
3.0000 mL | Freq: Two times a day (BID) | INTRAVENOUS | Status: DC
  Administered 2020-09-22: 3 mL via INTRAVENOUS

## 2020-09-21 MED ORDER — SODIUM CHLORIDE 0.9 % IV SOLN
100.0000 mg | Freq: Two times a day (BID) | INTRAVENOUS | Status: DC
  Filled 2020-09-21 (×2): qty 100

## 2020-09-21 MED ORDER — CEFTRIAXONE SODIUM 2 G IJ SOLR
2.0000 g | INTRAMUSCULAR | Status: DC
Start: 2020-09-21 — End: 2020-09-21
  Administered 2020-09-21: 2 g via INTRAVENOUS

## 2020-09-21 NOTE — Progress Notes (Addendum)
Advanced Heart Failure Rounding Note  PCP-Cardiologist: Minus Breeding, MD   Subjective:    Had left sided/ substernal CP yesterday that felt like prior anginal attacks. Relived w/ SLNTG. No further CP.   Currently on 2L [place], sitting up on side of bed. Unable to lay flat due to orthopnea.   Good UOP in ED w/ IV Lasix thus far. Purewick canister full w/ clear urine.   SCr trending up 4.52>>4.75 (was 2.5 on 1/20). BUN 93. K 5.0  Lactic acid 3.1   Hs trop 106>>122.  EKG accelerated junctional rhythm 77 bpm    Objective:   Weight Range:   There is no height or weight on file to calculate BMI.   Vital Signs:   Temp:  [97.9 F (36.6 C)-98.1 F (36.7 C)] 97.9 F (36.6 C) (02/07 0647) Pulse Rate:  [74-83] 74 (02/07 0647) Resp:  [12-23] 21 (02/07 0647) BP: (91-144)/(57-78) 144/57 (02/07 0647) SpO2:  [94 %-99 %] 97 % (02/07 0647)    Weight change: There were no vitals filed for this visit.  Intake/Output:   Intake/Output Summary (Last 24 hours) at 09/23/2020 1016 Last data filed at 09/17/2020 1007 Gross per 24 hour  Intake --  Output 850 ml  Net -850 ml      Physical Exam    General:  Obese, chronically ill appearing. No resp difficulty HEENT: Normal Neck: Supple. Thick neck, JVP not well visualized. Carotids 2+ bilat; no bruits. No lymphadenopathy or thyromegaly appreciated. Cor: PMI nondisplaced. Regular rate & rhythm. No rubs, gallops or murmurs. Lungs: decreased BS at the bases bilaterally  Abdomen: obese, soft, nontender, nondistended. No hepatosplenomegaly. No bruits or masses. Good bowel sounds. Extremities: No cyanosis, clubbing, rash, Trace LEE on the left s/p left transmetatarsal amputation  Neuro: Alert & orientedx3, cranial nerves grossly intact. moves all 4 extremities w/o difficulty. Affect pleasant   Telemetry   ? Junctional rhythm, 70s   EKG    Accelerated junctional rhythm 77 bpm   Labs    CBC Recent Labs    10/03/2020 1659  WBC 6.9   HGB 8.3*  HCT 26.9*  MCV 78.0*  PLT 833   Basic Metabolic Panel Recent Labs    10/10/2020 1659 09/19/2020 2110 10/12/2020 0325  NA 133*  --  133*  K 5.1  --  5.0  CL 96*  --  95*  CO2 20*  --  23  GLUCOSE 187*  --  229*  BUN 84*  --  93*  CREATININE 4.52*  --  4.75*  CALCIUM 8.5*  --  8.2*  MG  --  2.4  --    Liver Function Tests No results for input(s): AST, ALT, ALKPHOS, BILITOT, PROT, ALBUMIN in the last 72 hours. No results for input(s): LIPASE, AMYLASE in the last 72 hours. Cardiac Enzymes Recent Labs    10/09/2020 2110  CKTOTAL 48    BNP: BNP (last 3 results) Recent Labs    06/03/20 1354 07/02/20 1628 09/17/2020 1843  BNP 718.7* 1,102.7* 1,011.7*    ProBNP (last 3 results) No results for input(s): PROBNP in the last 8760 hours.   D-Dimer No results for input(s): DDIMER in the last 72 hours. Hemoglobin A1C Recent Labs    10/05/2020 2109  HGBA1C 8.0*   Fasting Lipid Panel No results for input(s): CHOL, HDL, LDLCALC, TRIG, CHOLHDL, LDLDIRECT in the last 72 hours. Thyroid Function Tests No results for input(s): TSH, T4TOTAL, T3FREE, THYROIDAB in the last 72 hours.  Invalid input(s):  FREET3  Other results:   Imaging    DG Chest 2 View  Result Date: 09/23/2020 CLINICAL DATA:  Chest pain, cough EXAM: CHEST - 2 VIEW COMPARISON:  07/20/2020 FINDINGS: Frontal and lateral views of the chest demonstrates stable enlargement of the cardiac silhouette. Aortic valve prosthesis again noted. Interval development of left lower lobe consolidation and small left pleural effusion, which could reflect bronchopneumonia. No pneumothorax. No acute bony abnormalities. IMPRESSION: 1. Patchy left lower lobe consolidation and small left pleural effusion, favor bronchopneumonia. Electronically Signed   By: Randa Ngo M.D.   On: 10/01/2020 17:11     Medications:     Scheduled Medications: . amiodarone  200 mg Oral Daily  . apixaban  5 mg Oral BID  . collagenase  1  application Topical Daily  . ferrous sulfate  325 mg Oral BID  . furosemide  80 mg Intravenous BID  . icosapent Ethyl  2 g Oral BID  . insulin aspart  0-15 Units Subcutaneous TID WC  . insulin NPH Human  35 Units Subcutaneous QAC breakfast  . pantoprazole  40 mg Oral Daily  . sodium chloride flush  3 mL Intravenous Q12H  . Tafamidis  1 capsule Oral Daily    Infusions: . sodium chloride      PRN Medications: sodium chloride, acetaminophen, ondansetron (ZOFRAN) IV, sodium chloride flush   Assessment/Plan   1. Acute on Chronic Diastolic Heart Failure - echo 11/21 EF 60-65% - multiple admissions for CHF in past year, has and continues to refuse CardioMEMS - now readmitted for a/c CHF. BNP 1,011. CXR w/ small lt pleural effusion  - IV lasix 80 mg bid  - monitor renal function closely  - repeat echo pending  - volume status assessment difficulty given body habitus. Also ?  Low output w/ worsening renal failure. No candidate for PICC w/ SCr > 2.0 and ? Need for future HD - May benefit from [place]   2. AKI on Stage III CKD - SCr up to 4.7 today (2.53 on 1/20)  - ? If related to recent bactrim use for sinusitis   - monitor w/ diuretics - consider checking UA and nephrology consult  - follow BMP  - avoid hypotension and nephrotoxic agents   3. PAF - EKG w ? accelerated junctional rhythm  - RRR on exam  - continue amiodarone + eliquis  3. TTR Cardiac Amyloidosis  - PYP study abnormal, myeloma panel negative. 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  - She is on tafamidis, continue.   4. H/o Aortic Stenosis S/p TAVR - echo 11/21 showed gradients across the TAVR prosthesis have increased and dimensionless index has worsened compared to 12/10/2019. By TEE, The TAVR valve appears to open relatively normally, but has a significantly elevated mean gradient (29 mmHg). The valve is relatively small and also suspect volume overload with high flow. The  valve does not appear thrombosed. - repeat echo pending   5. PAD - S/p PTCA to right popliteal and right AT in 7/20. Now with left distal SFA-TP trunk bypass and left TMA in 2/21.  - she is being followed closely by [facility] at [facility] and may need possible BKA in hear future if chronic LLE wound does not heal.  6. HTN - controlled on current regimen  - monitor. Avoid hypotension w/ AKI   8. OSA - CPAP qhs   9. Chronic Anemia - likely anemia of chronic disease from CKD  - Hgb 8.3 - Fe low  at 23, Sats ratio 6% - management/ feraheme per primary   10. Chest Pain w/ Mildly Elevated Hs Trop  - Know CAD. Cath in 8/19 with moderate RCA and LCx disease, severe distal LAD disease, medical management. - recent nitrate responsive CP  - Hs trop low level 106>>122 but in setting of ARF. Likely demand ischemia in setting of acute CHF exacerbation  - not candidate for repeat LHC currently  - continue medical therapy  No ASA given apixaban use.  - Continue pitivastatin 4 mg daily and Praluent.  - Continue fenofibrate and vascepa.    Length of Stay: [place], PA-C  09/30/2020, 10:16 AM  Advanced Heart Failure Team Pager [phone] (M-F; 7a - 4p)  Please contact [facility] for night-coverage after hours (4p -7a ) and weekends on [email or web address]  Patient seen with PA, agree with the above note.   She was admitted with chest pain that seems to have a pleuritic component as well as worsening shortness of breath. Recent increase in torsemide as outpatient did not help. Creatinine was noted to be up to 4.52, lactate elevated at 3.1.  Of note, she has been on Bactrim.  CXR showed RLL PNA, PCT 0.37. COVID-19 negative. BP stable, afebrile currently.  She is tachypneic. No chest pain currently.   General: NAD Neck: Thick, JVP difficult but appears elevated, no thyromegaly or thyroid nodule.  Lungs: Clear to auscultation bilaterally with normal respiratory effort. CV: Nondisplaced PMI.   Heart regular S1/S2, no S3/S4, 2/6 SEM RUSB.  Trace ankle edema  No carotid bruit.  Unable to palpate pedal pulses.  S/p left TMA.   Abdomen: Soft, nontender, no hepatosplenomegaly, mild distention.  Skin: Intact without lesions or rashes.  Neurologic: Alert and oriented x 3.  Psych: Normal affect. Extremities: No clubbing or cyanosis.  HEENT: Normal.   1. Acute on chronic diastolic CHF: TEE in 53/97 with LV EF 55%, RV normal, peak RV-RA gradient 63 mmHg, mild-moderate MR, mild MS, TAVR valve opens well but elevated mean gradient to 29 mmHg (small valve, volume overloaded). She is short of breath/tachypneic today.  Creatinine up to 4.75 but she looks volume overloaded by exam.  She is making urine with IV Lasix. Elevated lactate is concerning though BP is stable.  - Repeat lactate.  - Will repeat echo.  - Continue Lasix 80 mg IV bid for now, seems to be responding.  - With AKI and difficult exam, think she needs RHC to more closely assess filling pressures.  Will plan for tomorrow.  Hold Eliquis for now. Discussed risks/benefits with patient and she agrees to procedure.  2. AKI on CKD stage 3: Baseline creatinine around 2.5, now up to 4.75 but also appears volume overloaded.  She has been getting Bactrim for sinusitis which may be playing a role in this. BP is stable.  - Stop Bactrim.  - Think she needs diuresis as above.  - RHC as above.  - Will likely need nephrology consult.  3. ID: COVID-19 negative.  RLL PNA on CXR.  PCT 0.37, elevated but not markedly so.  Lactate elevated. WBCs normal. Also has left foot wound.  - I think that it would be safest to cover her for CAP for now, ceftriaxone/doxycycline.  - Send cultures.  4. Cardiac amyloidosis: Continue tafamidis.  5. CAD: Cath in 8/19 with moderate (50% stenosis) RCA and LCx disease, severe distal LAD disease, medical management.She had chest pain prior to admission, sounds like it had a pleuritic component.  HS-TnI 106 => 122, no trend.  Suspect most likely demand ischemia in setting of volume overload/renal failure.  - Continue statin.  - Not on ASA 81 given anticoagulation.  - Would avoid cath with AKI.  6. Atrial fibrillation: Paroxysmal.  She appears to be in junctional rhythm with IVCD, similar to prior ECG in 11/21.  - Continue amiodarone for now.  - Hold Eliquis for RHC tomorrow, resume afterwards.  7. Aortic stenosis s/p TAVR: TEE in 11/21 with elevated mean gradient to 29 across bioprosthetic aortic valve.  Valve opens well, suspect high gradient was due to relatively small size and volume overload.  - Repeat echo this admission.  8. Mitral valve disease: Mild MS and mild-moderate MR on 11/21 TEE.  9. PAD: S/p PTCA to right popliteal and right AT in 7/20. Now with left distal SFA-TP trunk bypass and left TMA in 2/21.  She is being followed closely by [facility] at [facility] and may need possible BKA in hear future if chronic LLE wound does not heal. - Wound consult.  10. OSA: CPAP.  11. Chronic anemia: Transferrin saturation 6%.   - Feraheme.   CRITICAL CARE Performed by: [name]  Total critical care time: 35 minutes  Critical care time was exclusive of separately billable procedures and treating other patients.  Critical care was necessary to treat or prevent imminent or life-threatening deterioration.  Critical care was time spent personally by me on the following activities: development of treatment plan with patient and/or surrogate as well as nursing, discussions with consultants, evaluation of patient's response to treatment, examination of patient, obtaining history from patient or surrogate, ordering and performing treatments and interventions, ordering and review of laboratory studies, ordering and review of radiographic studies, pulse oximetry and re-evaluation of patient's condition.  [name] 09/20/2020 12:58 PM

## 2020-09-21 NOTE — ED Notes (Signed)
Patient given diet coke and cup of ice chips, patient left sitting on side of bed with tray table in front of her, patient educated on how to use call light and where it was located.

## 2020-09-21 NOTE — Consult Note (Signed)
WOC Nurse Consult Note: Patient receiving care in [facility] [place] room. Reason for Consult: LLE ulcer Wound type: PAD Pressure Injury POA: Yes/No/NA Measurement: 5.3 cm x 3 cm comprised of 2 distinct areas separated by a narrow intact tissue island. Wound bed: both areas are covered in heavy creamy white non-viable slough Drainage (amount, consistency, odor) none Periwound: intact Dressing procedure/placement/frequency: santyl daily with telfa and kerlex. Thank you for the consult.  Discussed plan of care with the patient and bedside nurse.  [place] nurse will not follow at this time.  Please re-consult the [place] team if needed.  Val Riles, RN, MSN, CWOCN, CNS-BC, pager [phone]

## 2020-09-21 NOTE — Progress Notes (Signed)
  Echocardiogram 2D Echocardiogram has been performed.  [name] 10/10/2020, 3:32 PM

## 2020-09-21 NOTE — Telephone Encounter (Signed)
Patient now has [employer] and [employer] grant to use for Vyndamax. She is going to get the medication shipped to her home using [facility] specialty pharmacy program.   Will seek manufacturer assistance in the future if grants run out.   Charlann Boxer, CPhT

## 2020-09-21 NOTE — ED Notes (Signed)
Date and time results received: 09/29/2020 0027  Test: Lactic Acid Critical Value: 3.1  Name of Provider Notified:   Orders Received? Or Actions Taken?: Will page hospitalist

## 2020-09-21 NOTE — Progress Notes (Signed)
Progress Note    [name]  KYH:[id number] DOB: 02/26/1951  DOA: 10/05/2020 PCP: [name], DO    Brief Narrative:   Chief complaint: Dyspnea  Medical records reviewed and are as summarized below:  [name] is an 70 y.o. 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 Aundra Dubin who presented to the ED via EMS with a complaint of a 2 week history of worsening dyspnea, orthopnea, PND associated with chest tightness and lower extremity edema.  Las seen by cardiologist on 09/03/20 and noted to be mildly hypervolemic, tried increasing torsemide = limited UOP at home. Recently treated with prednisone and bactrim on 09/16/20 for sinusitis. Labs notable for troponin of 106 and 122, BNP 1011.  Creat 4.5 up from 2.5 on 09/03/20, HGB 8.3 (stable). COVID neg. CXR: LLL consolidation and small effusion.  Cardiology evaluated and placed on higher dose diuretics.  Assessment/Plan:   Principal Problem:   Acute on chronic diastolic CHF (congestive heart failure) (HCC)/hyponatremia, POA Evaluated by cardiology. CXR/BNP elevation consistent with decompensated CHF. EKG shows accelerated junctional rhythm. Last echo don 11/21 and showed preserved EF of 60-65%. Repeat echo pending. Cardiology considering [place].  Continue diuresis with Lasix 80 mg IV BID and Tafamidis, remains massively volume overloaded. Hyponatremia is likely from fluid overload and should improve with diuresis, monitor. Add daily weights. ACE/ARB contraindicated given AKI/stage IV CKD.  Active Problems:   Diabetic ulcer LLE, POA Continue Santyl and wound care.    Abnormal CXR, Pneumonia ruled out, POA No leukocytosis or fever and procalcitonin 0.37. Stop Rocephin and doxycycline. Abnormal CXR likely from volume overload.    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 Hgb A1c 8%.  CBGs 220-240's. Will add meal coverage.    AKI on CKD (chronic kidney disease) stage 4, GFR 15-29 ml/min (HCC) Creatinine 4.2-->4.75.  High risk of cardiorenal syndrome. Monitor creatinine closely with diuresis.    Essential hypertension Continue Lasix. Hydralazine on hold.    Dyslipidemia associated with DM Continue Vascepa.     Anemia Continue iron. Ferritin 40, iron 23, saturation ratio 6%.    Sinusitis Typically not treated with antibiotics. Nasal decongestants/steroids are usually the treatment. Bactrim discontinued due to AKI. Stop prednisone.    PAF  Continue amiodarone and Eliquis.   Class 3 obesity Nutritional status        There is no height or weight on file to calculate BMI.   Family Communication/Anticipated D/C date and plan/Code Status   DVT prophylaxis:  apixaban (ELIQUIS) tablet 5 mg   Current Level of Care::  Telemetry Cardiac Code Status: Full Code.  Family Communication: Husband updated by telephone. Disposition Plan: Status is: Inpatient  Remains inpatient appropriate because:Volume overloaded, needs close monitoring while diuresing due to AKI on stage 4 CKD.   Dispo: The patient is from: Home              Anticipated d/c is to: Home              Anticipated d/c date is: 2 days              Patient currently is not medically stable to d/c.   Difficult to place patient No  Medical Consultants:    Heart Failure Team   Anti-Infectives:  None  Subjective:   No complaints of chest pain. Breathing better, has a dry cough. Reports leg swelling. Some nausea and vomiting earlier today. Says her mouth is dry. Poor appetite.  Objective:    Vitals:   10/05/2020 0100 10/02/2020 0200 09/28/2020 0300 09/15/2020 0647  BP: (!) 144/67 (!) 141/73 (!) 115/59 (!) 144/57  Pulse: 78 74 75 74  Resp: 18 (!) 22 (!) 23 (!) 21  Temp:    97.9 F (36.6 C)  TempSrc:    Oral  SpO2: 98% 99% 94% 97%    No intake or output data in the 24 hours ending 10/08/2020 0942 There were no vitals filed for this visit.  Exam: General: No acute distress. Sitting up trying to eat breakfast. Obese. Cardiovascular: Heart sounds show a regular rate, and rhythm. No gallops or rubs. No murmurs. No JVD. Lungs: Crackles bilaterally, decreased air movement.  Abdomen: Soft, nontender, nondistended with normal active bowel sounds. No masses. No hepatosplenomegaly. Neurological: Alert and oriented 3. Moves all extremities 4 with equal strength. Cranial nerves II through XII grossly intact. Skin: Warm and dry. Ulcers to posterior left lower leg. Scattered petechiae and ecchymosis upper extremities. Extremities: No clubbing or cyanosis. 2+ pitting edema. Pedal pulses 2+. Left transmetatarsal amputation. Psychiatric: Mood and affect are depressed. Insight and judgment are fair.     Data Reviewed:   I have personally reviewed following labs and imaging studies:  Labs: Labs show the following:   Basic Metabolic Panel: Recent Labs  Lab 10/11/2020 1659 10/09/2020 2110 09/18/2020 0325  NA 133*  --  133*  K 5.1  --  5.0  CL 96*  --  95*  CO2 20*  --  23  GLUCOSE 187*  --  229*  BUN 84*  --  93*  CREATININE 4.52*  --  4.75*  CALCIUM 8.5*  --  8.2*  MG  --  2.4  --    GFR Estimated Creatinine Clearance: 12.2 mL/min (A) (by C-G formula based on SCr of 4.75 mg/dL (H)).  CBC: Recent Labs  Lab 10/02/2020 1659  WBC 6.9  HGB 8.3*  HCT 26.9*  MCV 78.0*  PLT 346   Cardiac Enzymes: Recent Labs  Lab 09/26/2020 2110  CKTOTAL 48   CBG: Recent Labs  Lab 09/29/2020 2245 09/29/2020 0838  GLUCAP 242* 224*   Hgb A1c: Recent Labs    09/17/2020 2109  HGBA1C 8.0*   Anemia work up: Recent Labs    09/19/2020 2058 10/12/2020 2336  VITAMINB12  --  714  FOLATE  --  11.9  FERRITIN  --  40  TIBC  --  391  IRON  --  23*  RETICCTPCT 5.5*  --    Sepsis Labs: Recent Labs  Lab 09/18/2020 1659 10/09/2020 2110  10/12/2020 2335  PROCALCITON  --  0.37  --   WBC 6.9  --   --   LATICACIDVEN  --   --  3.1*    Microbiology Recent Results (from the past 240 hour(s))  SARS Coronavirus 2 by RT PCR (hospital order, performed in [place] hospital lab) Nasopharyngeal Nasopharyngeal Swab     Status: None   Collection Time: 10/09/2020  6:33 PM   Specimen: Nasopharyngeal Swab  Result Value Ref Range Status   SARS Coronavirus 2 NEGATIVE NEGATIVE Final    Comment: (NOTE) SARS-CoV-2 target nucleic acids are NOT DETECTED.  The SARS-CoV-2 RNA is generally detectable in upper and lower respiratory specimens during the acute phase of infection. The  lowest concentration of SARS-CoV-2 viral copies this assay can detect is 250 copies / mL. A negative result does not preclude SARS-CoV-2 infection and should not be used as the sole basis for treatment or other patient management decisions.  A negative result may occur with improper specimen collection / handling, submission of specimen other than nasopharyngeal swab, presence of viral mutation(s) within the areas targeted by this assay, and inadequate number of viral copies (<250 copies / mL). A negative result must be combined with clinical observations, patient history, and epidemiological information.  Fact Sheet for Patients:   StrictlyIdeas.no  Fact Sheet for Healthcare Providers: BankingDealers.co.za  This test is not yet approved or  cleared by the [place] FDA and has been authorized for detection and/or diagnosis of SARS-CoV-2 by FDA under an Emergency Use Authorization (EUA).  This EUA will remain in effect (meaning this test can be used) for the duration of the COVID-19 declaration under Section 564(b)(1) of the Act, 21 U.S.C. section 360bbb-3(b)(1), unless the authorization is terminated or revoked sooner.  Performed at [facility], [place] [address], [place], [place] [address]      Procedures and diagnostic studies:  DG Chest 2 View  Result Date: 09/27/2020 CLINICAL DATA:  Chest pain, cough EXAM: CHEST - 2 VIEW COMPARISON:  07/20/2020 FINDINGS: Frontal and lateral views of the chest demonstrates stable enlargement of the cardiac silhouette. Aortic valve prosthesis again noted. Interval development of left lower lobe consolidation and small left pleural effusion, which could reflect bronchopneumonia. No pneumothorax. No acute bony abnormalities. IMPRESSION: 1. Patchy left lower lobe consolidation and small left pleural effusion, favor bronchopneumonia. Electronically Signed   By: Randa Ngo M.D.   On: 09/27/2020 17:11    Medications:   . amiodarone  200 mg Oral Daily  . apixaban  5 mg Oral BID  . collagenase  1 application Topical Daily  . ferrous sulfate  325 mg Oral BID  . furosemide  80 mg Intravenous Daily  . icosapent Ethyl  2 g Oral BID  . insulin aspart  0-15 Units Subcutaneous TID WC  . insulin NPH Human  35 Units Subcutaneous QAC breakfast  . pantoprazole  40 mg Oral Daily  . sodium chloride flush  3 mL Intravenous Q12H  . Tafamidis  1 capsule Oral Daily   Continuous Infusions: . sodium chloride       LOS: 1 day   Jacquelynn Cree, MD  [facility]   [facility] How to contact the [facility] Attending or Consulting provider [place] or covering provider during after hours [place], for this patient?  1. Check the care team in [facility] and look for a) attending/consulting [facility] provider listed and b) the [facility] team listed 2. Log into [email or web address] and use [facility]'s universal password to access. If you do not have the password, please contact the hospital operator. 3. Locate the [facility] provider you are looking for under [facility] and page to a number that you can be directly reached. 4. If you still have difficulty reaching the provider, please page the [facility] (Director on Call) for the Hospitalists listed on amion for assistance.  10/05/2020,  9:42 AM

## 2020-09-21 NOTE — ED Notes (Signed)
Trop 106 Breakfast order placed

## 2020-09-22 ENCOUNTER — Inpatient Hospital Stay (HOSPITAL_COMMUNITY): Payer: Medicare Other | Admitting: Critical Care Medicine

## 2020-09-22 ENCOUNTER — Ambulatory Visit (HOSPITAL_COMMUNITY): Admission: RE | Admit: 2020-09-22 | Payer: Medicare Other | Source: Home / Self Care | Admitting: Cardiology

## 2020-09-22 ENCOUNTER — Inpatient Hospital Stay (HOSPITAL_COMMUNITY): Payer: Medicare Other

## 2020-09-22 DIAGNOSIS — I4891 Unspecified atrial fibrillation: Secondary | ICD-10-CM | POA: Diagnosis not present

## 2020-09-22 DIAGNOSIS — I469 Cardiac arrest, cause unspecified: Secondary | ICD-10-CM | POA: Diagnosis not present

## 2020-09-22 DIAGNOSIS — N179 Acute kidney failure, unspecified: Secondary | ICD-10-CM | POA: Diagnosis not present

## 2020-09-22 DIAGNOSIS — I5033 Acute on chronic diastolic (congestive) heart failure: Secondary | ICD-10-CM | POA: Diagnosis not present

## 2020-09-22 DIAGNOSIS — N184 Chronic kidney disease, stage 4 (severe): Secondary | ICD-10-CM | POA: Diagnosis not present

## 2020-09-22 DIAGNOSIS — Z794 Long term (current) use of insulin: Secondary | ICD-10-CM | POA: Diagnosis not present

## 2020-09-22 DIAGNOSIS — D62 Acute posthemorrhagic anemia: Secondary | ICD-10-CM

## 2020-09-22 DIAGNOSIS — E1122 Type 2 diabetes mellitus with diabetic chronic kidney disease: Secondary | ICD-10-CM | POA: Diagnosis not present

## 2020-09-22 LAB — CBC WITH DIFFERENTIAL/PLATELET
Abs Immature Granulocytes: 0.1 10*3/uL — ABNORMAL HIGH (ref 0.00–0.07)
Basophils Absolute: 0 10*3/uL (ref 0.0–0.1)
Basophils Relative: 0 %
Eosinophils Absolute: 0 10*3/uL (ref 0.0–0.5)
Eosinophils Relative: 0 %
HCT: 21.9 % — ABNORMAL LOW (ref 36.0–46.0)
Hemoglobin: 6.4 g/dL — CL (ref 12.0–15.0)
Immature Granulocytes: 1 %
Lymphocytes Relative: 14 %
Lymphs Abs: 1.6 10*3/uL (ref 0.7–4.0)
MCH: 23 pg — ABNORMAL LOW (ref 26.0–34.0)
MCHC: 29.2 g/dL — ABNORMAL LOW (ref 30.0–36.0)
MCV: 78.8 fL — ABNORMAL LOW (ref 80.0–100.0)
Monocytes Absolute: 0.9 10*3/uL (ref 0.1–1.0)
Monocytes Relative: 8 %
Neutro Abs: 8.4 10*3/uL — ABNORMAL HIGH (ref 1.7–7.7)
Neutrophils Relative %: 77 %
Platelets: 301 10*3/uL (ref 150–400)
RBC: 2.78 MIL/uL — ABNORMAL LOW (ref 3.87–5.11)
RDW: 19.6 % — ABNORMAL HIGH (ref 11.5–15.5)
WBC: 11 10*3/uL — ABNORMAL HIGH (ref 4.0–10.5)
nRBC: 0.5 % — ABNORMAL HIGH (ref 0.0–0.2)

## 2020-09-22 LAB — BASIC METABOLIC PANEL
Anion gap: 15 (ref 5–15)
Anion gap: 19 — ABNORMAL HIGH (ref 5–15)
BUN: 109 mg/dL — ABNORMAL HIGH (ref 8–23)
BUN: 104 mg/dL — ABNORMAL HIGH (ref 8–23)
CO2: 22 mmol/L (ref 22–32)
CO2: 19 mmol/L — ABNORMAL LOW (ref 22–32)
Calcium: 8.3 mg/dL — ABNORMAL LOW (ref 8.9–10.3)
Calcium: 9.8 mg/dL (ref 8.9–10.3)
Chloride: 97 mmol/L — ABNORMAL LOW (ref 98–111)
Chloride: 100 mmol/L (ref 98–111)
Creatinine, Ser: 4.5 mg/dL — ABNORMAL HIGH (ref 0.44–1.00)
Creatinine, Ser: 4.21 mg/dL — ABNORMAL HIGH (ref 0.44–1.00)
GFR, Estimated: 10 mL/min — ABNORMAL LOW (ref >60)
GFR, Estimated: 11 mL/min — ABNORMAL LOW (ref >60)
Glucose, Bld: 223 mg/dL — ABNORMAL HIGH (ref 70–99)
Glucose, Bld: 255 mg/dL — ABNORMAL HIGH (ref 70–99)
Potassium: 4.6 mmol/L (ref 3.5–5.1)
Potassium: 4.6 mmol/L (ref 3.5–5.1)
Sodium: 134 mmol/L — ABNORMAL LOW (ref 135–145)
Sodium: 138 mmol/L (ref 135–145)

## 2020-09-22 LAB — CBC
HCT: 22.3 % — ABNORMAL LOW (ref 36.0–46.0)
Hemoglobin: 6.2 g/dL — CL (ref 12.0–15.0)
MCH: 23.2 pg — ABNORMAL LOW (ref 26.0–34.0)
MCHC: 27.8 g/dL — ABNORMAL LOW (ref 30.0–36.0)
MCV: 83.5 fL (ref 80.0–100.0)
Platelets: 260 10*3/uL (ref 150–400)
RBC: 2.67 MIL/uL — ABNORMAL LOW (ref 3.87–5.11)
RDW: 19.8 % — ABNORMAL HIGH (ref 11.5–15.5)
WBC: 10.5 10*3/uL (ref 4.0–10.5)
nRBC: 7.5 % — ABNORMAL HIGH (ref 0.0–0.2)

## 2020-09-22 LAB — GLUCOSE, CAPILLARY
Glucose-Capillary: 217 mg/dL — ABNORMAL HIGH (ref 70–99)
Glucose-Capillary: 169 mg/dL — ABNORMAL HIGH (ref 70–99)

## 2020-09-22 LAB — PREPARE RBC (CROSSMATCH)

## 2020-09-22 LAB — PROTIME-INR
INR: 2.7 — ABNORMAL HIGH (ref 0.8–1.2)
Prothrombin Time: 27.6 seconds — ABNORMAL HIGH (ref 11.4–15.2)

## 2020-09-22 LAB — LACTIC ACID, PLASMA: Lactic Acid, Venous: 1.8 mmol/L (ref 0.5–1.9)

## 2020-09-22 LAB — TROPONIN I (HIGH SENSITIVITY): Troponin I (High Sensitivity): 489 ng/L (ref <18)

## 2020-09-22 MED ORDER — AMIODARONE HCL IN DEXTROSE 360-4.14 MG/200ML-% IV SOLN
60.0000 mg/h | INTRAVENOUS | Status: DC
Start: 2020-09-22 — End: 2020-09-22
  Administered 2020-09-22: 60 mg/h via INTRAVENOUS
  Filled 2020-09-22: qty 200

## 2020-09-22 MED ORDER — SODIUM BICARBONATE 8.4 % IV SOLN
INTRAVENOUS | Status: AC
  Filled 2020-09-22: qty 50

## 2020-09-22 MED ORDER — SODIUM CHLORIDE 0.9 % IV SOLN: INTRAVENOUS | Status: DC

## 2020-09-22 MED ORDER — EPINEPHRINE HCL 5 MG/250ML IV SOLN IN NS
0.5000 ug/min | INTRAVENOUS | Status: DC
  Filled 2020-09-22: qty 250

## 2020-09-22 MED ORDER — SODIUM CHLORIDE 0.9 % IV SOLN: 250.0000 mL | INTRAVENOUS | Status: DC

## 2020-09-22 MED ORDER — SODIUM CHLORIDE 0.9% IV SOLUTION: Freq: Once | INTRAVENOUS | Status: DC

## 2020-09-22 MED ORDER — SODIUM CHLORIDE 0.9 % IV SOLN: 250.0000 mL | INTRAVENOUS | Status: DC | PRN

## 2020-09-22 MED ORDER — SODIUM BICARBONATE 8.4 % IV SOLN
INTRAVENOUS | Status: AC
  Filled 2020-09-22: qty 100

## 2020-09-22 MED ORDER — INSULIN ASPART 100 UNIT/ML ~~LOC~~ SOLN: 6.0000 [IU] | Freq: Three times a day (TID) | SUBCUTANEOUS | Status: DC

## 2020-09-22 MED ORDER — ASPIRIN 81 MG PO CHEW
81.0000 mg | CHEWABLE_TABLET | ORAL | Status: DC
  Filled 2020-09-22: qty 1

## 2020-09-22 MED ORDER — NOREPINEPHRINE 4 MG/250ML-% IV SOLN: 2.0000 ug/min | INTRAVENOUS | Status: DC

## 2020-09-22 MED ORDER — SODIUM CHLORIDE 0.9% FLUSH: 3.0000 mL | INTRAVENOUS | Status: DC | PRN

## 2020-09-22 MED ORDER — AMIODARONE HCL IN DEXTROSE 360-4.14 MG/200ML-% IV SOLN
30.0000 mg/h | INTRAVENOUS | Status: DC
  Filled 2020-09-22: qty 200

## 2020-09-23 LAB — BPAM RBC
Blood Product Expiration Date: 202203032359
Blood Product Expiration Date: 202203032359
ISSUE DATE / TIME: 202202081018
ISSUE DATE / TIME: 202202081653
Unit Type and Rh: 6200
Unit Type and Rh: 6200

## 2020-09-23 LAB — TYPE AND SCREEN
ABO/RH(D): A POS
Antibody Screen: NEGATIVE
Unit division: 0
Unit division: 0

## 2020-09-23 SURGERY — RIGHT HEART CATH
Anesthesia: LOCAL

## 2020-09-25 MED FILL — Medication: Qty: 1 | Status: AC

## 2020-09-26 LAB — CULTURE, BLOOD (ROUTINE X 2)
Culture: NO GROWTH
Special Requests: ADEQUATE

## 2020-09-27 LAB — CULTURE, BLOOD (ROUTINE X 2)
Culture: NO GROWTH
Special Requests: ADEQUATE

## 2020-09-29 ENCOUNTER — Telehealth: Payer: Medicare Other

## 2020-10-08 ENCOUNTER — Ambulatory Visit: Payer: Medicare Other | Admitting: Cardiology

## 2020-10-13 NOTE — Progress Notes (Signed)
Received call from lab that pt's hemoglobin was 6.4. Messaged MD. MD ordered redo of H&H lab to verify the result.

## 2020-10-13 NOTE — Progress Notes (Signed)
Patient time of death occurred at 20  Upon assessment code activated. Compressions started with Code present chaplin IV team at [facility]. ROSC x3 No pulse, fixed eyes end-time per family at 48.    Family Notified. At [facility], Emotional support given.  CDS called and notified.

## 2020-10-13 NOTE — Progress Notes (Addendum)
Advanced Heart Failure Rounding Note  PCP-Cardiologist: Minus Breeding, MD   Subjective:   Yesterday diuresed with IV lasix. Sluggish urine output.   Hgb 6.4   Creatinine 4.52>>4.75>>4.5  (was 2.5 on 1/20). BUN 93>109.   Lactic acid 3.1 >1.8   Hs trop 106>>122.    Complaining of nausea/vomiting. In A fib this morning.    Objective:   Weight Range: 91.2 kg Body mass index is 32.45 kg/m.   Vital Signs:   Temp:  [97.5 F (36.4 C)] 97.5 F (36.4 C) (02/08 0314) Pulse Rate:  [65-78] 73 (02/08 0314) Resp:  [14-22] 18 (02/08 0314) BP: (70-142)/(41-105) 131/47 (02/08 0314) SpO2:  [92 %-100 %] 92 % (02/08 0314) Weight:  [91.2 kg] 91.2 kg (02/08 0313)    Weight change: Filed Weights   10/12/20 0313  Weight: 91.2 kg    Intake/Output:   Intake/Output Summary (Last 24 hours) at 10-12-20 0743 Last data filed at 10/03/2020 1007 Gross per 24 hour  Intake -  Output 850 ml  Net -850 ml      Physical Exam   General:  Appears ill. Nauseated. No resp difficulty HEENT: normal Neck: supple. JVD difficult to assess due to body habitus. Carotids 2+ bilat; no bruits. No lymphadenopathy or thryomegaly appreciated. Cor: PMI nondisplaced. Irregular rate & rhythm. No rubs, gallops or murmurs. Lungs: clear Abdomen: soft, nontender, nondistended. No hepatosplenomegaly. No bruits or masses. Good bowel sounds. Extremities: no cyanosis, clubbing, rash, edema. L transmetatarsal amputation.   Neuro: alert & orientedx3, cranial nerves grossly intact. moves all 4 extremities w/o difficulty. Affect flat    Telemetry  A fib 130s with NSVT  EKG   A fib 130s  Labs    CBC Recent Labs    10/02/2020 1659 2020/10/12 0317  WBC 6.9 11.0*  NEUTROABS  --  8.4*  HGB 8.3* 6.4*  HCT 26.9* 21.9*  MCV 78.0* 78.8*  PLT 346 092   Basic Metabolic Panel Recent Labs    10/09/2020 2110 10/11/2020 0325 10/12/20 0317  NA  --  133* 134*  K  --  5.0 4.6  CL  --  95* 97*  CO2  --  23 22   GLUCOSE  --  229* 223*  BUN  --  93* 109*  CREATININE  --  4.75* 4.50*  CALCIUM  --  8.2* 8.3*  MG 2.4  --   --    Liver Function Tests No results for input(s): AST, ALT, ALKPHOS, BILITOT, PROT, ALBUMIN in the last 72 hours. No results for input(s): LIPASE, AMYLASE in the last 72 hours. Cardiac Enzymes Recent Labs    09/29/2020 2110  CKTOTAL 48    BNP: BNP (last 3 results) Recent Labs    06/03/20 1354 07/02/20 1628 10/08/2020 1843  BNP 718.7* 1,102.7* 1,011.7*    ProBNP (last 3 results) No results for input(s): PROBNP in the last 8760 hours.   D-Dimer No results for input(s): DDIMER in the last 72 hours. Hemoglobin A1C Recent Labs    10/03/2020 2109  HGBA1C 8.0*   Fasting Lipid Panel No results for input(s): CHOL, HDL, LDLCALC, TRIG, CHOLHDL, LDLDIRECT in the last 72 hours. Thyroid Function Tests No results for input(s): TSH, T4TOTAL, T3FREE, THYROIDAB in the last 72 hours.  Invalid input(s): FREET3  Other results:   Imaging    ECHOCARDIOGRAM COMPLETE  Result Date: 10/07/2020    ECHOCARDIOGRAM REPORT   Patient Name:   [name] Date of Exam: 09/30/2020 Medical Rec #:  [id number]        Height:       66.0 in Accession #:    [id number]       Weight:       186.0 lb Date of Birth:  [birth year]        BSA:          1.939 m Patient Age:    70 years         BP:           119/43 mmHg Patient Gender: F                HR:           68 bpm. Exam Location:  Inpatient Procedure: 2D Echo, Cardiac Doppler and Color Doppler Indications:    CHF  History:        Patient has prior history of Echocardiogram examinations, most                 recent 07/08/2020. CHF, CAD, PAD, Aortic Valve Disease,                 Arrythmias:Atrial Fibrillation, Signs/Symptoms:Chest Pain and                 Morbid obesity; Risk Factors:Diabetes and Hypertension. Pleural                 effusion, CKD.                 Aortic Valve: 23 mm Sapien prosthetic, stented (TAVR) valve is                 present in  the aortic position.  Sonographer:    Dustin Flock Referring Phys: [id number] JARED M GARDNER  Sonographer Comments: Patient is morbidly obese. Image acquisition challenging due to patient body habitus. IMPRESSIONS  1. Left ventricular ejection fraction, by estimation, is 55%. The left ventricle has normal function. Left ventricular endocardial border not optimally defined to evaluate regional wall motion. There is moderate left ventricular hypertrophy. Left ventricular diastolic parameters are consistent with Grade II diastolic dysfunction (pseudonormalization).  2. Right ventricular systolic function is mildly reduced. The right ventricular size is normal. Peak RV-RA gradient 30 mmHg. The IVC was not well-visualized.  3. Left atrial size was mild to moderately dilated.  4. The mitral valve is degenerative. Mild mitral valve regurgitation. Mild mitral stenosis. The mean mitral valve gradient is 5.0 mmHg. MVA 1.98 cm^2 by PHT.  5. Bioprosthetic aortic valve s/p TAVR. #23 Edwards Sapien THV. Mean gradient elevated at 22 mmHg. No significant perivalvular leakage noted.  6. Moderate pericardial effusion, no evidence for tamponade. FINDINGS  Left Ventricle: Left ventricular ejection fraction, by estimation, is 55%. The left ventricle has normal function. Left ventricular endocardial border not optimally defined to evaluate regional wall motion. The left ventricular internal cavity size was normal in size. There is moderate left ventricular hypertrophy. Left ventricular diastolic parameters are consistent with Grade II diastolic dysfunction (pseudonormalization). Right Ventricle: The right ventricular size is normal. No increase in right ventricular wall thickness. Right ventricular systolic function is mildly reduced. Left Atrium: Left atrial size was mild to moderately dilated. Right Atrium: Right atrial size was normal in size. Pericardium: A moderately sized pericardial effusion is present. Mitral Valve: The mitral  valve is degenerative in appearance. There is moderate calcification of the mitral valve leaflet(s). Mild mitral annular calcification. Mild mitral valve regurgitation. Mild mitral valve stenosis. MV peak gradient, 21.3 mmHg.  The  mean mitral valve gradient is 5.0 mmHg. Tricuspid Valve: The tricuspid valve is normal in structure. Tricuspid valve regurgitation is trivial. Aortic Valve: Bioprosthetic aortic valve s/p TAVR. #23 Edwards Sapien THV. Mean gradient elevated at 22 mmHg. No significant perivalvular leakage noted. The aortic valve has been repaired/replaced. Aortic valve regurgitation is not visualized. Aortic valve mean gradient measures 22.0 mmHg. Aortic valve peak gradient measures 36.7 mmHg. Aortic valve area, by VTI measures 1.12 cm. There is a 23 mm Sapien prosthetic, stented (TAVR) valve present in the aortic position. Pulmonic Valve: The pulmonic valve was normal in structure. Pulmonic valve regurgitation is trivial. Aorta: The aortic root is normal in size and structure. Venous: The inferior vena cava was not well visualized. IAS/Shunts: No atrial level shunt detected by color flow Doppler.  LEFT VENTRICLE PLAX 2D LVIDd:         3.10 cm  Diastology LVIDs:         2.20 cm  LV e' medial:    3.92 cm/s LV PW:         1.60 cm  LV E/e' medial:  46.7 LV IVS:        1.70 cm  LV e' lateral:   3.92 cm/s LVOT diam:     2.10 cm  LV E/e' lateral: 46.7 LV SV:         71 LV SV Index:   37 LVOT Area:     3.46 cm  RIGHT VENTRICLE RV Basal diam:  3.90 cm RV S prime:     8.49 cm/s TAPSE (M-mode): 3.8 cm LEFT ATRIUM             Index       RIGHT ATRIUM           Index LA diam:        4.80 cm 2.48 cm/m  RA Area:     16.70 cm LA Vol (A2C):   78.3 ml 40.38 ml/m RA Volume:   47.30 ml  24.39 ml/m LA Vol (A4C):   74.0 ml 38.16 ml/m LA Biplane Vol: 76.7 ml 39.56 ml/m  AORTIC VALVE AV Area (Vmax):    1.20 cm AV Area (Vmean):   1.08 cm AV Area (VTI):     1.12 cm AV Vmax:           303.00 cm/s AV Vmean:           221.000 cm/s AV VTI:            0.635 m AV Peak Grad:      36.7 mmHg AV Mean Grad:      22.0 mmHg LVOT Vmax:         105.00 cm/s LVOT Vmean:        68.900 cm/s LVOT VTI:          0.205 m LVOT/AV VTI ratio: 0.32  AORTA Ao Root diam: 3.30 cm MITRAL VALVE                TRICUSPID VALVE MV Area (PHT): 1.99 cm     TR Peak grad:   30.0 mmHg MV Area VTI:   1.13 cm     TR Vmax:        274.00 cm/s MV Peak grad:  21.3 mmHg MV Mean grad:  5.0 mmHg     SHUNTS MV Vmax:       2.31 m/s     Systemic VTI:  0.20 m MV Vmean:      90.8  cm/s    Systemic Diam: 2.10 cm MV Decel Time: 382 msec MV E velocity: 183.00 cm/s MV A velocity: 37.50 cm/s MV E/A ratio:  4.88 [name] MD Electronically signed by [name] MD Signature Date/Time: 09/30/2020/5:05:28 PM    Final      Medications:     Scheduled Medications: . amiodarone  200 mg Oral Daily  . [START ON 09/23/2020] aspirin  81 mg Oral Pre-Cath  . collagenase  1 application Topical Daily  . furosemide  80 mg Intravenous BID  . icosapent Ethyl  2 g Oral BID  . insulin aspart  0-15 Units Subcutaneous TID WC  . insulin aspart  6 Units Subcutaneous TID WC  . insulin NPH Human  35 Units Subcutaneous QAC breakfast  . pantoprazole  40 mg Oral Daily  . sodium chloride flush  3 mL Intravenous Q12H  . sodium chloride flush  3 mL Intravenous Q12H  . Tafamidis  1 capsule Oral Daily    Infusions: . sodium chloride    . sodium chloride    . [START ON 09/23/2020] sodium chloride      PRN Medications: sodium chloride, sodium chloride, acetaminophen, ondansetron (ZOFRAN) IV, sodium chloride flush, sodium chloride flush   Assessment/Plan   1. Acute on Chronic Diastolic Heart Failure - echo 11/21 EF 60-65% - multiple admissions for CHF in past year, has and continues to refuse CardioMEMS - now readmitted for a/c CHF. BNP 1,011. CXR w/ small lt pleural effusion  - Sluggish response to IV lasix.  - - ECHO EF 55%. IVC not well visualized.  -  - 2. AKI on Stage III  CKD - SCr up to 4. 5 today (2.53 on 1/20)  - ? If related to recent bactrim use for sinusitis   - monitor w/ diuretics - consider checking UA and nephrology consult  - avoid hypotension and nephrotoxic agents   3. PAF -- In A fib RVR.  Start amio drip and add heparin drip.  - No eliquis for now.   3. TTR Cardiac Amyloidosis  - PYP study abnormal, myeloma panel negative. 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  - She is on tafamidis, continue.   4. H/o Aortic Stenosis S/p TAVR - echo 11/21 showed gradients across the TAVR prosthesis have increased and dimensionless index has worsened compared to 12/10/2019. By TEE, The TAVR valve appears to open relatively normally, but has a significantly elevated mean gradient (29 mmHg). The valve is relatively small and also suspect volume overload with high flow. The valve does not appear thrombosed. - repeat echo pending   5. PAD - S/p PTCA to right popliteal and right AT in 7/20. Now with left distal SFA-TP trunk bypass and left TMA in 2/21.  - she is being followed closely by [facility] at [facility] and may need possible BKA in hear future if chronic LLE wound does not heal.  6. HTN - controlled on current regimen  - monitor. Avoid hypotension w/ AKI   8. OSA - CPAP qhs   9. Chronic Anemia - likely anemia of chronic disease from CKD  - Last scope 2016- normal study.  - Follows with Dr Henrene Pastor in the community.  - On PPI.  - Hgb down to 6.4 today. No obvious source. Receiving PRBs today.  - Fe low at 23, Sats ratio 6% - management/ feraheme per primary   10. Chest Pain w/ Mildly Elevated Hs Trop  - Know CAD. Cath in 8/19 with moderate RCA  and LCx disease, severe distal LAD disease, medical management. - recent nitrate responsive CP  - Hs trop low level 106>>122 but in setting of ARF. Likely demand ischemia in setting of acute CHF exacerbation  - No ASA given apixaban use.  - Continue pitivastatin 4  mg daily and Praluent.  - Continue fenofibrate and vascepa.   EKG now.--> A fib RVR  - Continue IV lasix.  - Hold off on cath. Will set up for tomorrow.  - Getting blood. No obvious source of bleeding.  - Start amio drip + hep drip   Length of Stay: 2  Amy Clegg, NP  2020-10-16, 7:43 AM  Advanced Heart Failure Team Pager [phone] (M-F; 7a - 4p)  Please contact [facility] for night-coverage after hours (4p -7a ) and weekends on [email or web address]  Patient seen with NP, agree with the above note.   Feels bad, nausea/vomiting.  No hematemesis and denies melena/BRBPR. Hgb down to 6.4.    She is back in atrial fibrillation with RVR this morning, HR 140s.    BUN/creatinine elevated but fairly stable.  Does not appear to have diuresed much with IV Lasix.   General: NAD Neck: Thick, suspect JVP elevated, no thyromegaly or thyroid nodule.  Lungs: Decreased at bases. CV: Nondisplaced PMI.  Heart tachy, irregular S1/S2, no S3/S4, no murmur.  No peripheral edema.   Abdomen: Soft, nontender, no hepatosplenomegaly, no distention.  Skin: Intact without lesions or rashes.  Neurologic: Alert and oriented x 3.  Psych: Normal affect. Extremities: s/p left TMA  HEENT: Normal.   1. Acute on chronic diastolic CHF: TEE in 79/39 with LV EF 55%, RV normal, peak RV-RA gradient 63 mmHg, mild-moderate MR, mild MS, TAVR valve opens well but elevated mean gradient to 29 mmHg (small valve, volume overloaded). Echo this admission with EF 55%, moderate LVH, mildly decreased RV systolic function, mild MR, mild MR (mean gradient 5), TAVR valve with mean gradient 22 mmHg.  She is short of breath still.  Creatinine up to 4.5 but suspect volume overload.  Now in atrial fibrillation with RVR and more anemic which complicate.  Lactate now normal at 1.8.  - For now, will continue Lasix 80 mg IV bid (renal function stable).  - With AKI and difficult exam, think she needs RHC to more closely assess filling pressures.   Will put off until tomorrow given afib/RVR today and need for transfusion.  Hold Eliquis for now. Discussed risks/benefits with patient and she agrees to procedure.  2. AKI on CKD stage 3: Baseline creatinine around 2.5, now up to 4.75 => 4.5 but also appears volume overloaded.  She had been getting Bactrim for sinusitis which may be playing a role in this. BP is stable.  - Off Bactrim.  - Think she needs diuresis as above.  - RHC as above.  - Will likely need nephrology consult.  3. ID: COVID-19 negative.  RLL infiltrate on CXR.  PCT 0.37, elevated but not markedly so. WBCs normal. Also has left foot wound.  -Hospitalist stopped antibiotics.  4. Cardiac amyloidosis: Continue tafamidis.  5. CAD: Cath in 8/19 with moderate (50% stenosis) RCA and LCx disease, severe distal LAD disease, medical management.She had chest pain prior to admission, sounds like it had a pleuritic component.  HS-TnI 106 => 122, no trend. Suspect most likely demand ischemia in setting of volume overload/renal failure.  - Continue statin.  - Not on ASA 81 given anticoagulation.  - Would avoid coronary angiography with AKI.  6. Atrial fibrillation: Paroxysmal, went into atrial fibrillation with RVR this morning, feels "terrible."   - Amiodarone gtt 60 mg/hr.  - Not overtly bleeding with chronic anemia, but hgb down to 6.4.  Getting transfused.  Can cover with IV heparin as long as no overt bleeding.   - Eventual TEE-guided DCCV if she remains in fibrillation.  7. Aortic stenosis s/p TAVR: TEE in 11/21 with elevated mean gradient to 29 across bioprosthetic aortic valve.  Valve opens well, suspect high gradient was due to relatively small size and volume overload. Echo this admission with mean gradient 22 mmHg.  8. Mitral valve disease: Mild MS and mild MR on echo this admission.  9. PAD: S/p PTCA to right popliteal and right AT in 7/20. Now with left distal SFA-TP trunk bypass and left TMA in 2/21. She is being followed  closely by [facility] at [facility] and may need possible BKA in hear future if chronic LLE wound does not heal. - Wound consult done.  10. OSA: CPAP.  11. Acute on chronic anemia: Transferrin saturation 6%.  Hgb 6.4.  No overt bleeding as of yet.   - Feraheme given.  - Will get blood today.  - Consider GI consult.  - Holding Eliquis, cover with heparin unless overt bleeding.   [name] 10-12-20 9:17 AM

## 2020-10-13 NOTE — Death Summary Note (Addendum)
DEATH SUMMARY   Patient Details  Name: [name] MRN: [id number] DOB: June 08, 1951  Admission/Discharge Information   Admit Date:  09/26/20  Date of Death:  2020/09/28  Time of Death:   10:32 a.m.  Length of Stay: 2  Referring Physician: [name], DO   Reason(s) for Hospitalization   Acute on chronic diastolic CHF.     Diagnoses   Preliminary cause of death: PEA cardiac arrest in the setting of atrial fibrillation with RVR and acute blood loss anemia.  Secondary Diagnoses (including complications and co-morbidities):  Principal Problem:   Acute on chronic diastolic CHF (congestive heart failure) (HCC) Active Problems:   Hyperlipidemia associated with type 2 diabetes mellitus (HCC)   Type II diabetes mellitus with renal manifestations (HCC)   Atrial fibrillation with RVR (HCC)   Hypertension associated with diabetes ([place])   CKD (chronic kidney disease) stage 4, GFR 15-29 ml/min (HCC)   Essential hypertension   Class 3 obesity   AKI (acute kidney injury) ([place])   Anemia   Sinusitis   Diabetic ulcer of lower leg (HCC)   Cardiopulmonary arrest ([place])   Acute blood loss anemia   Brief Hospital Course (including significant findings, care, treatment, and services provided and events leading to death)   [name] is a 70 y.o. 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 Aundra Dubin who presented to the ED via EMS with a complaint of a 2 week history of worsening dyspnea, orthopnea, PND associated with chest tightness and lower extremity edema.  Las seen by cardiologist on 09/03/20 and noted to be mildly hypervolemic, tried increasing torsemide = limited UOP at home. Recently treated with prednisone and bactrim on 09/16/20 for sinusitis. Labs notable for troponin of 106 and 122, BNP 1011. Creat 4.5 up from 2.5 on 09/03/20, HGB 8.3 (stable). COVID neg. CXR: LLL  consolidation and small effusion.  Cardiology evaluated and placed on higher dose diuretics. Seemed to do well overnight but on 09/28/20, found to be in afib with RVR and complained of nausea/vomiting. Hgb dropped to 6.4 mg/dL.  After obtaining consent verbally, order placed for type and cross and to transfuse 1 unit of PRBCs. Dr. Aundra Dubin saw patient in the a.m and noted sluggish response to Lasix. Was planning to take for cardiac cath 09/23/20 and placed orders for amiodarone/heparin drip.  Before these medications could be initiated, she went into PEA arrest and a CODE BLUE was called.  CPR initiated and patient intubated and bagged with breath sounds noted bilaterally. She had multiple rounds of epinephrine and was placed on an epinephrine drip.  She also received 2 rounds of atropine, 2 rounds of calcium and an amp of sodium bicarb.  A norepinephrine drip was also started. PCCM took over code and despite intermittent ROSC and subsequent pacing attempts, the patient repeatedly lost her pulse.  She was persistently hypotensive.  Prognosis felt to be poor with multiple rounds of CPR and loss of pulse. 2 units of blood were ordered for emergency release, and 1 unit was transfused before the patient's family requested that we terminate ongoing resuscitation efforts.   She was pronounced deceased at 10:32 am.  Husband escorted to bedside and declined autopsy.   Pertinent Labs and Studies  Significant Diagnostic Studies DG Chest 2 View  Result Date: 09/26/20 CLINICAL DATA:  Chest pain, cough EXAM: CHEST - 2 VIEW COMPARISON:  07/20/2020 FINDINGS:  Frontal and lateral views of the chest demonstrates stable enlargement of the cardiac silhouette. Aortic valve prosthesis again noted. Interval development of left lower lobe consolidation and small left pleural effusion, which could reflect bronchopneumonia. No pneumothorax. No acute bony abnormalities. IMPRESSION: 1. Patchy left lower lobe consolidation and small left  pleural effusion, favor bronchopneumonia. Electronically Signed   By: Randa Ngo M.D.   On: 09/19/2020 17:11   ECHOCARDIOGRAM COMPLETE  Result Date: 10/09/2020    ECHOCARDIOGRAM REPORT   Patient Name:   [name] Date of Exam: 09/20/2020 Medical Rec #:  [id number]        Height:       66.0 in Accession #:    [id number]       Weight:       186.0 lb Date of Birth:  August 08, 1951        BSA:          1.939 m Patient Age:    70 years         BP:           119/43 mmHg Patient Gender: F                HR:           68 bpm. Exam Location:  Inpatient Procedure: 2D Echo, Cardiac Doppler and Color Doppler Indications:    CHF  History:        Patient has prior history of Echocardiogram examinations, most                 recent 07/08/2020. CHF, CAD, PAD, Aortic Valve Disease,                 Arrythmias:Atrial Fibrillation, Signs/Symptoms:Chest Pain and                 Morbid obesity; Risk Factors:Diabetes and Hypertension. Pleural                 effusion, CKD.                 Aortic Valve: 23 mm Sapien prosthetic, stented (TAVR) valve is                 present in the aortic position.  Sonographer:    Dustin Flock Referring Phys: [phone] JARED M GARDNER  Sonographer Comments: Patient is morbidly obese. Image acquisition challenging due to patient body habitus. IMPRESSIONS  1. Left ventricular ejection fraction, by estimation, is 55%. The left ventricle has normal function. Left ventricular endocardial border not optimally defined to evaluate regional wall motion. There is moderate left ventricular hypertrophy. Left ventricular diastolic parameters are consistent with Grade II diastolic dysfunction (pseudonormalization).  2. Right ventricular systolic function is mildly reduced. The right ventricular size is normal. Peak RV-RA gradient 30 mmHg. The IVC was not well-visualized.  3. Left atrial size was mild to moderately dilated.  4. The mitral valve is degenerative. Mild mitral valve regurgitation. Mild mitral  stenosis. The mean mitral valve gradient is 5.0 mmHg. MVA 1.98 cm^2 by PHT.  5. Bioprosthetic aortic valve s/p TAVR. #23 Edwards Sapien THV. Mean gradient elevated at 22 mmHg. No significant perivalvular leakage noted.  6. Moderate pericardial effusion, no evidence for tamponade. FINDINGS  Left Ventricle: Left ventricular ejection fraction, by estimation, is 55%. The left ventricle has normal function. Left ventricular endocardial border not optimally defined to evaluate regional wall motion. The left ventricular internal cavity size was normal in size. There is moderate  left ventricular hypertrophy. Left ventricular diastolic parameters are consistent with Grade II diastolic dysfunction (pseudonormalization). Right Ventricle: The right ventricular size is normal. No increase in right ventricular wall thickness. Right ventricular systolic function is mildly reduced. Left Atrium: Left atrial size was mild to moderately dilated. Right Atrium: Right atrial size was normal in size. Pericardium: A moderately sized pericardial effusion is present. Mitral Valve: The mitral valve is degenerative in appearance. There is moderate calcification of the mitral valve leaflet(s). Mild mitral annular calcification. Mild mitral valve regurgitation. Mild mitral valve stenosis. MV peak gradient, 21.3 mmHg. The  mean mitral valve gradient is 5.0 mmHg. Tricuspid Valve: The tricuspid valve is normal in structure. Tricuspid valve regurgitation is trivial. Aortic Valve: Bioprosthetic aortic valve s/p TAVR. #23 Edwards Sapien THV. Mean gradient elevated at 22 mmHg. No significant perivalvular leakage noted. The aortic valve has been repaired/replaced. Aortic valve regurgitation is not visualized. Aortic valve mean gradient measures 22.0 mmHg. Aortic valve peak gradient measures 36.7 mmHg. Aortic valve area, by VTI measures 1.12 cm. There is a 23 mm Sapien prosthetic, stented (TAVR) valve present in the aortic position. Pulmonic Valve: The  pulmonic valve was normal in structure. Pulmonic valve regurgitation is trivial. Aorta: The aortic root is normal in size and structure. Venous: The inferior vena cava was not well visualized. IAS/Shunts: No atrial level shunt detected by color flow Doppler.  LEFT VENTRICLE PLAX 2D LVIDd:         3.10 cm  Diastology LVIDs:         2.20 cm  LV e' medial:    3.92 cm/s LV PW:         1.60 cm  LV E/e' medial:  46.7 LV IVS:        1.70 cm  LV e' lateral:   3.92 cm/s LVOT diam:     2.10 cm  LV E/e' lateral: 46.7 LV SV:         71 LV SV Index:   37 LVOT Area:     3.46 cm  RIGHT VENTRICLE RV Basal diam:  3.90 cm RV S prime:     8.49 cm/s TAPSE (M-mode): 3.8 cm LEFT ATRIUM             Index       RIGHT ATRIUM           Index LA diam:        4.80 cm 2.48 cm/m  RA Area:     16.70 cm LA Vol (A2C):   78.3 ml 40.38 ml/m RA Volume:   47.30 ml  24.39 ml/m LA Vol (A4C):   74.0 ml 38.16 ml/m LA Biplane Vol: 76.7 ml 39.56 ml/m  AORTIC VALVE AV Area (Vmax):    1.20 cm AV Area (Vmean):   1.08 cm AV Area (VTI):     1.12 cm AV Vmax:           303.00 cm/s AV Vmean:          221.000 cm/s AV VTI:            0.635 m AV Peak Grad:      36.7 mmHg AV Mean Grad:      22.0 mmHg LVOT Vmax:         105.00 cm/s LVOT Vmean:        68.900 cm/s LVOT VTI:          0.205 m LVOT/AV VTI ratio: 0.32  AORTA Ao Root diam: 3.30 cm MITRAL VALVE  TRICUSPID VALVE MV Area (PHT): 1.99 cm     TR Peak grad:   30.0 mmHg MV Area VTI:   1.13 cm     TR Vmax:        274.00 cm/s MV Peak grad:  21.3 mmHg MV Mean grad:  5.0 mmHg     SHUNTS MV Vmax:       2.31 m/s     Systemic VTI:  0.20 m MV Vmean:      90.8 cm/s    Systemic Diam: 2.10 cm MV Decel Time: 382 msec MV E velocity: 183.00 cm/s MV A velocity: 37.50 cm/s MV E/A ratio:  4.88 [name] MD Electronically signed by [name] MD Signature Date/Time: 09/26/2020/5:05:28 PM    Final     Microbiology Recent Results (from the past 240 hour(s))  SARS Coronavirus 2 by RT PCR (hospital order,  performed in [facility] hospital lab) Nasopharyngeal Nasopharyngeal Swab     Status: None   Collection Time: 09/26/2020  6:33 PM   Specimen: Nasopharyngeal Swab  Result Value Ref Range Status   SARS Coronavirus 2 NEGATIVE NEGATIVE Final    Comment: (NOTE) SARS-CoV-2 target nucleic acids are NOT DETECTED.  The SARS-CoV-2 RNA is generally detectable in upper and lower respiratory specimens during the acute phase of infection. The lowest concentration of SARS-CoV-2 viral copies this assay can detect is 250 copies / mL. A negative result does not preclude SARS-CoV-2 infection and should not be used as the sole basis for treatment or other patient management decisions.  A negative result may occur with improper specimen collection / handling, submission of specimen other than nasopharyngeal swab, presence of viral mutation(s) within the areas targeted by this assay, and inadequate number of viral copies (<250 copies / mL). A negative result must be combined with clinical observations, patient history, and epidemiological information.  Fact Sheet for Patients:   StrictlyIdeas.no  Fact Sheet for Healthcare Providers: BankingDealers.co.za  This test is not yet approved or  cleared by the [place] FDA and has been authorized for detection and/or diagnosis of SARS-CoV-2 by FDA under an Emergency Use Authorization (EUA).  This EUA will remain in effect (meaning this test can be used) for the duration of the COVID-19 declaration under Section 564(b)(1) of the Act, 21 U.S.C. section 360bbb-3(b)(1), unless the authorization is terminated or revoked sooner.  Performed at [facility], [place] [address] [place], [place] [address]   Culture, blood (routine x 2)     Status: None (Preliminary result)   Collection Time: 10/05/2020  4:03 PM   Specimen: BLOOD  Result Value Ref Range Status   Specimen Description BLOOD SITE NOT SPECIFIED  Final    Special Requests   Final    BOTTLES DRAWN AEROBIC ONLY Blood Culture adequate volume   Culture   Final    NO GROWTH < 24 HOURS Performed at [facility], [place] [address] [place], [place] [address]    Report Status PENDING  Incomplete  Culture, blood (routine x 2)     Status: None (Preliminary result)   Collection Time: 27-Sep-2020  3:17 AM   Specimen: BLOOD LEFT HAND  Result Value Ref Range Status   Specimen Description BLOOD LEFT HAND  Final   Special Requests   Final    BOTTLES DRAWN AEROBIC ONLY Blood Culture adequate volume   Culture   Final    NO GROWTH < 12 HOURS Performed at [facility], [place] [address],  [place],  [address]    Report Status PENDING  Incomplete    Lab Basic Metabolic Panel: Recent Labs  Lab 10/08/2020 1659 10/11/2020 2110 09/23/2020 0325 10/19/2020 0317  NA 133*  --  133* 134*  K 5.1  --  5.0 4.6  CL 96*  --  95* 97*  CO2 20*  --  23 22  GLUCOSE 187*  --  229* 223*  BUN 84*  --  93* 109*  CREATININE 4.52*  --  4.75* 4.50*  CALCIUM 8.5*  --  8.2* 8.3*  MG  --  2.4  --   --    CBC: Recent Labs  Lab 10/12/2020 1659 Oct 19, 2020 0317  WBC 6.9 11.0*  NEUTROABS  --  8.4*  HGB 8.3* 6.4*  HCT 26.9* 21.9*  MCV 78.0* 78.8*  PLT 346 301   Cardiac Enzymes: Recent Labs  Lab 10/09/2020 2110  CKTOTAL 48   Sepsis Labs: Recent Labs  Lab 09/26/2020 1659 09/16/2020 2110 10/11/2020 2335 09/26/2020 1603 2020/10/19 0317  PROCALCITON  --  0.37  --   --   --   WBC 6.9  --   --   --  11.0*  LATICACIDVEN  --   --  3.1* 2.2* 1.8     Jacquelynn Cree, MD [facility] 10/19/20, 11:04 AM  Critical care time: 9:30 am - 10:32 am

## 2020-10-13 NOTE — Anesthesia Procedure Notes (Signed)
Procedure Name: Intubation Date/Time: Oct 13, 2020 9:47 AM Performed by: Wilburn Cornelia, CRNA Pre-anesthesia Checklist: Patient identified, Emergency Drugs available, Suction available, Patient being monitored and Timeout performed Patient Re-evaluated:Patient Re-evaluated prior to induction Oxygen Delivery Method: Ambu bag Preoxygenation: Pre-oxygenation with 100% oxygen Ventilation: Mask ventilation without difficulty Laryngoscope Size: Mac and 3 Grade View: Grade I Tube type: Oral Tube size: 7.5 mm Number of attempts: 1 Airway Equipment and Method: Stylet and Video-laryngoscopy Placement Confirmation: ETT inserted through vocal cords under direct vision,  positive ETCO2,  CO2 detector and breath sounds checked- equal and bilateral Secured at: 22 cm Tube secured with: Tape Dental Injury: Teeth and Oropharynx as per pre-operative assessment  Comments: Blood present in oropharynx on arrival to code.  DLx1 with MAC 3.  Unable to obtain view b/c of compressions and blood noted in airway.  Glidescope (3) utilized grade 1 view - 7.5 ETT placed.  EtCO2 confirmation.  +/=BBS.

## 2020-10-13 NOTE — Code Documentation (Signed)
  Patient Name: [name]   MRN: [id number]   Date of Birth/ Sex: [birth year] , female      Admission Date: 09/15/2020  Attending Provider: Tonye Royalty, MD  Primary Diagnosis: AKI (acute kidney injury) ([place]) [N17.9] Acute on chronic diastolic CHF (congestive heart failure) ([place]) [I50.33] Congestive heart failure, unspecified HF chronicity, unspecified heart failure type ([place]) [I50.9]   Indication: Pt was in her usual state of health until this AM, when she was noted to feel unwell and short of breath.  She was noted to be in A-fib w/ RVR and and Amiodarone gtt was ordered. Before the medication was initiated, she fell into a PEA arrest. Code blue was subsequently called. At the time of arrival on scene, ACLS protocol was underway.   Technical Description:  - CPR performance duration:  4 rounds of CPR, each between 6-20 minutes  - Was defibrillation or cardioversion used? Yes   - Was external pacer placed? Yes  - Was patient intubated pre/post CPR? Yes   Medications Administered: Y = Yes; Blank = No Amiodarone    Atropine  2  Calcium  2  Epinephrine  9+gtt  Lidocaine    Magnesium  1  Norepinephrine  gtt  Phenylephrine    Sodium bicarbonate  1  Vasopressin    Other    Post CPR evaluation:  - Final Status - Was patient successfully resuscitated ? No   Miscellaneous Information:  - Time of death:  10:32 AM  - Primary team notified?  Yes  - Family Notified? Yes     Matilde Haymaker, MD   2020/09/27, 10:37 AM

## 2020-10-13 NOTE — Significant Event (Signed)
Patient patient a-fib with RVR starting Amio drip and patient went up responsive V-Tach, Called Code Blue Started Compression. Code blue team responded.

## 2020-10-13 NOTE — Progress Notes (Signed)
Inpatient Diabetes Program Recommendations  AACE/ADA: New Consensus Statement on Inpatient Glycemic Control (2015)  Target Ranges:  Prepandial:   less than 140 mg/dL      Peak postprandial:   less than 180 mg/dL (1-2 hours)      Critically ill patients:  140 - 180 mg/dL   Lab Results  Component Value Date   GLUCAP 217 (H) 09/28/20   HGBA1C 8.0 (H) 09/15/2020    Review of Glycemic Control Results for [name], [name] (MRN [id number]) as of 2020-09-28 10:33  Ref. Range 09/30/2020 11:36 10/02/2020 16:33 09/24/2020 17:18 2020-09-28 06:29  Glucose-Capillary Latest Ref Range: 70 - 99 mg/dL 251 (H) 327 (H) 314 (H) 217 (H)   Diabetes history: Type 2 DM Outpatient Diabetes medications: NPH 50 units QAM, Regular 5-12 units BID Current orders for Inpatient glycemic control: NPH 35 units QAM, Novolog 6 units TID, Novolog 0-15 units TID  Inpatient Diabetes Program Recommendations:    Patient intubated this AM following code blue.   Consider:  -Changing NPH to Levemir 35 units QD -Changing correction to Novolog 2-6 units Q4H -Discontinuing Novolog 6 units TID Secure chat sent.   Thanks, Bronson Curb, MSN, RNC-OB Diabetes Coordinator [phone] (8a-5p)

## 2020-10-13 NOTE — Progress Notes (Signed)
Cath postponed due A fib RVR, Hgb 6.4, and nausea/vomiting.  Starting amio drip .    Move cath to tomorrow per Dr Aundra Dubin. NPO after MN.   Cleburn Maiolo NP-C  9:15 AM

## 2020-10-13 NOTE — Code Documentation (Signed)
Responded to consult after first cardiac arrest. Patient had responded to TC pacing after the first code but went back into PEA arrest. Total of 4 codes treated per ACLS protocol. Additional bicarbonate, calcium given during resuscitation attempts. NS bolus and pRBCs given during codes. Code terminated at family's request after >50 minutes total resuscitation.   This patient is critically ill with multiple organ system failure which requires frequent high complexity decision making, assessment, support, evaluation, and titration of therapies. This was completed through the application of advanced monitoring technologies and extensive interpretation of multiple databases. During this encounter critical care time was devoted to patient care services described in this note for 45 minutes.   Julian Hy, DO Oct 07, 2020 10:54 AM [facility]

## 2020-10-13 NOTE — Progress Notes (Signed)
Responded to code blue to support patient and staff.  Patient died.  Family has been notified and is in route to hospital.  Provided emotional and spiritual care to patient and staff.  Will follow as needed.   Jaclynn Major, [place], [facility], Pager [phone]

## 2020-10-13 NOTE — Progress Notes (Signed)
   Oct 12, 2020 0844  Assess: MEWS Score  Temp 98.8 F (37.1 C)  BP 94/66  ECG Heart Rate (!) 129  Level of Consciousness Alert  Assess: MEWS Score  MEWS Temp 0  MEWS Systolic 1  MEWS Pulse 2  MEWS RR 0  MEWS LOC 0  MEWS Score 3  MEWS Score Color Yellow  Assess: if the MEWS score is Yellow or Red  Were vital signs taken at a resting state? Yes  Focused Assessment Change from prior assessment (see assessment flowsheet)  Early Detection of Sepsis Score *See Row Information* Low  MEWS guidelines implemented *See Row Information* Yes  Notify: Charge Nurse/RN  Name of Charge Nurse/RN Notified Tai Rn  Date Charge Nurse/RN Notified October 12, 2020  Time Charge Nurse/RN Notified [id number]  Notify: Provider  Provider Name/Title Rama MD, Amy NP  Date Provider Notified 12-Oct-2020  Time Provider Notified 0800  Notification Type Rounds  Notification Reason Change in status  Response See new orders  Date of Provider Response 2020-10-12  Time of Provider Response 0801  Notify: Rapid Response  Name of Rapid Response RN Notified n/a  Document  Patient Outcome Not stable and remains on department  Progress note created (see row info) Yes

## 2020-10-13 DEATH — deceased

## 2020-10-28 ENCOUNTER — Ambulatory Visit: Payer: Medicare Other | Admitting: Family Medicine

## 2020-11-04 ENCOUNTER — Other Ambulatory Visit (HOSPITAL_BASED_OUTPATIENT_CLINIC_OR_DEPARTMENT_OTHER): Payer: Self-pay

## 2020-11-13 ENCOUNTER — Encounter (HOSPITAL_COMMUNITY): Payer: Medicare Other | Admitting: Cardiology

## 2021-05-01 IMAGING — CR DG CHEST 2V
2 series · 2 of 2 positions shown · non-contrast
Comparison: None.

CLINICAL DATA: Shortness of breath. Left leg pain and swelling.
Chronic swelling of both lower extremities. Diabetic.

EXAM:
CHEST - 2 VIEW

[chest lat]
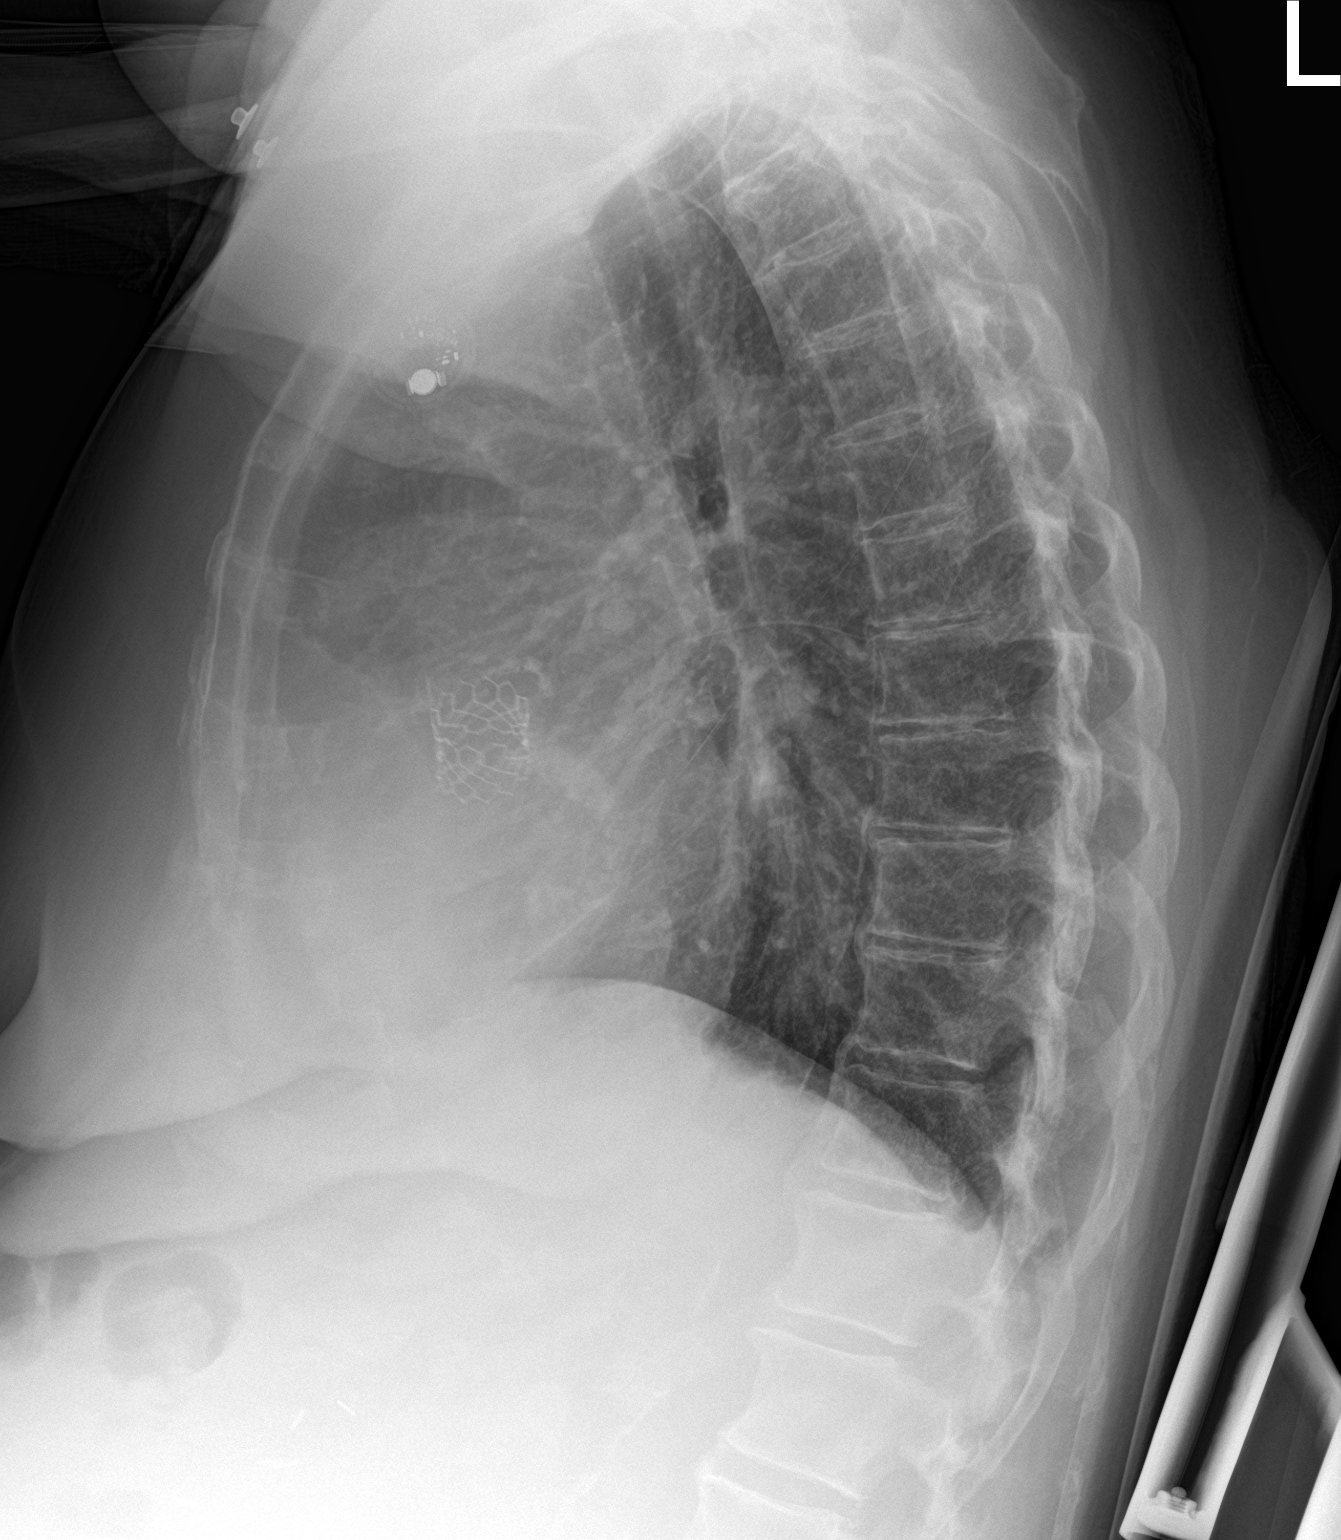

[chest ap]
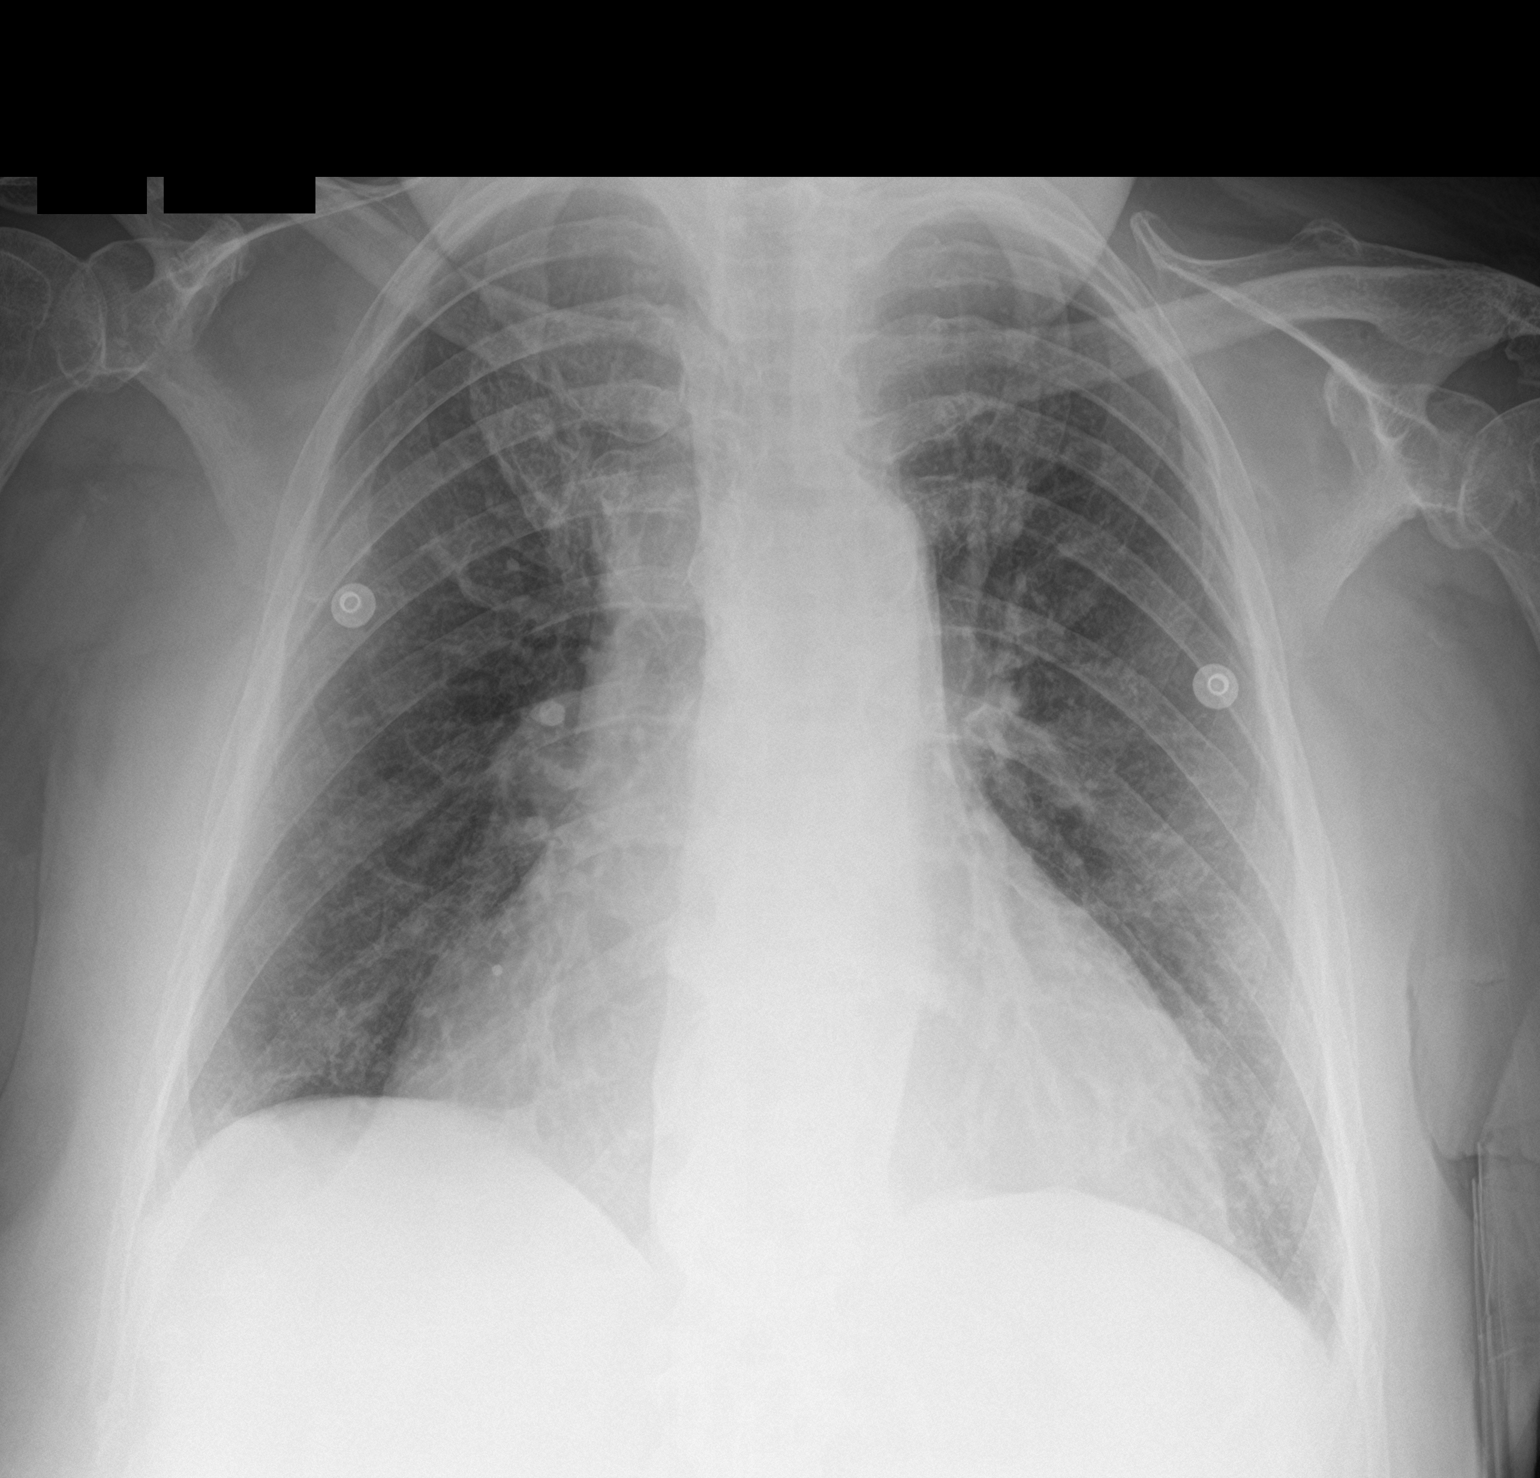

[2 of 2 positions shown; findings below may reference images not displayed]

FINDINGS: Redemonstration of an enlarged cardiac silhouette. Otherwise the
heart size and mediastinal contours are unchanged. Aortic arch
calcifications.

No focal consolidation. No pulmonary edema. No pleural effusion. No
pneumothorax.

No acute osseous abnormality.  Degenerative changes of the spine.
IMPRESSION: Similar-appearing enlarged cardiac silhouette with otherwise no
acute cardiopulmonary disease.
